# Patient Record
Sex: Male | Born: 1955 | Race: White | Marital: Single | Smoking: Former smoker
Health system: Southern US, Community
[De-identification: ages and names within clinical notes are randomized; demographics above are authoritative.]

## PROBLEM LIST (undated history)

## (undated) DIAGNOSIS — Z992 Dependence on renal dialysis: Secondary | ICD-10-CM

## (undated) DIAGNOSIS — M1990 Unspecified osteoarthritis, unspecified site: Secondary | ICD-10-CM

## (undated) DIAGNOSIS — C801 Malignant (primary) neoplasm, unspecified: Secondary | ICD-10-CM

## (undated) DIAGNOSIS — K209 Esophagitis, unspecified without bleeding: Secondary | ICD-10-CM

## (undated) DIAGNOSIS — I219 Acute myocardial infarction, unspecified: Secondary | ICD-10-CM

## (undated) DIAGNOSIS — G4730 Sleep apnea, unspecified: Secondary | ICD-10-CM

## (undated) DIAGNOSIS — E119 Type 2 diabetes mellitus without complications: Secondary | ICD-10-CM

## (undated) DIAGNOSIS — I5022 Chronic systolic (congestive) heart failure: Secondary | ICD-10-CM

## (undated) DIAGNOSIS — I10 Essential (primary) hypertension: Secondary | ICD-10-CM

## (undated) DIAGNOSIS — N186 End stage renal disease: Secondary | ICD-10-CM

## (undated) DIAGNOSIS — D649 Anemia, unspecified: Secondary | ICD-10-CM

## (undated) HISTORY — DX: End stage renal disease: Z99.2

## (undated) HISTORY — PX: EYE SURGERY: SHX253

## (undated) HISTORY — PX: EXCISION MORTON'S NEUROMA: SHX5013

## (undated) HISTORY — PX: CATARACT EXTRACTION: SUR2

## (undated) HISTORY — PX: CARDIAC SURGERY: SHX584

---

## 2009-09-20 ENCOUNTER — Ambulatory Visit: Payer: Self-pay | Admitting: Family Medicine

## 2009-09-20 ENCOUNTER — Inpatient Hospital Stay (HOSPITAL_COMMUNITY): Admission: EM | Admit: 2009-09-20 | Discharge: 2009-09-21 | Payer: Self-pay | Admitting: Emergency Medicine

## 2009-09-25 ENCOUNTER — Ambulatory Visit (HOSPITAL_COMMUNITY): Admission: RE | Admit: 2009-09-25 | Discharge: 2009-09-25 | Payer: Self-pay | Admitting: Gastroenterology

## 2009-11-06 ENCOUNTER — Encounter: Payer: Self-pay | Admitting: *Deleted

## 2010-07-13 NOTE — Miscellaneous (Signed)
Summary: Do Not Reschedule  Missed NP appt.  Per [facility] policy is not allowed to rschedule/  Elray Mcgregor RN  Nov 06, 2009 4:57 PM

## 2010-08-31 LAB — DIFFERENTIAL
Basophils Absolute: 0.1 10*3/uL (ref 0.0–0.1)
Basophils Relative: 1 % (ref 0–1)
Eosinophils Absolute: 0.1 10*3/uL (ref 0.0–0.7)
Eosinophils Relative: 2 % (ref 0–5)
Lymphocytes Relative: 23 % (ref 12–46)
Lymphs Abs: 1.5 10*3/uL (ref 0.7–4.0)
Monocytes Absolute: 0.4 10*3/uL (ref 0.1–1.0)
Monocytes Relative: 7 % (ref 3–12)
Neutro Abs: 4.3 10*3/uL (ref 1.7–7.7)
Neutrophils Relative %: 67 % (ref 43–77)

## 2010-08-31 LAB — CBC
HCT: 24.3 % — ABNORMAL LOW (ref 39.0–52.0)
Hemoglobin: 8.5 g/dL — ABNORMAL LOW (ref 13.0–17.0)
MCHC: 35 g/dL (ref 30.0–36.0)
MCV: 86 fL (ref 78.0–100.0)
Platelets: 346 10*3/uL (ref 150–400)
RBC: 2.82 MIL/uL — ABNORMAL LOW (ref 4.22–5.81)
RDW: 16.6 % — ABNORMAL HIGH (ref 11.5–15.5)
WBC: 6.3 10*3/uL (ref 4.0–10.5)

## 2010-09-01 LAB — DIFFERENTIAL
Basophils Absolute: 0.1 10*3/uL (ref 0.0–0.1)
Basophils Relative: 1 % (ref 0–1)
Eosinophils Absolute: 0.1 10*3/uL (ref 0.0–0.7)
Eosinophils Relative: 2 % (ref 0–5)
Lymphocytes Relative: 28 % (ref 12–46)
Lymphs Abs: 1.9 10*3/uL (ref 0.7–4.0)
Monocytes Absolute: 0.4 10*3/uL (ref 0.1–1.0)
Monocytes Relative: 7 % (ref 3–12)
Neutro Abs: 4.3 10*3/uL (ref 1.7–7.7)
Neutrophils Relative %: 63 % (ref 43–77)

## 2010-09-01 LAB — CBC
HCT: 21 % — ABNORMAL LOW (ref 39.0–52.0)
HCT: 21 % — ABNORMAL LOW (ref 39.0–52.0)
Hemoglobin: 7.3 g/dL — ABNORMAL LOW (ref 13.0–17.0)
Hemoglobin: 7.3 g/dL — ABNORMAL LOW (ref 13.0–17.0)
MCHC: 34.7 g/dL (ref 30.0–36.0)
MCHC: 34.8 g/dL (ref 30.0–36.0)
MCV: 85.3 fL (ref 78.0–100.0)
MCV: 85.5 fL (ref 78.0–100.0)
Platelets: 282 10*3/uL (ref 150–400)
Platelets: 296 10*3/uL (ref 150–400)
RBC: 2.46 MIL/uL — ABNORMAL LOW (ref 4.22–5.81)
RBC: 2.46 MIL/uL — ABNORMAL LOW (ref 4.22–5.81)
RDW: 14.9 % (ref 11.5–15.5)
RDW: 15.4 % (ref 11.5–15.5)
WBC: 6 10*3/uL (ref 4.0–10.5)
WBC: 6.8 10*3/uL (ref 4.0–10.5)

## 2010-09-01 LAB — POCT I-STAT, CHEM 8
BUN: 14 mg/dL (ref 6–23)
Calcium, Ion: 1.18 mmol/L (ref 1.12–1.32)
Chloride: 105 mEq/L (ref 96–112)
Creatinine, Ser: 0.7 mg/dL (ref 0.4–1.5)
Glucose, Bld: 284 mg/dL — ABNORMAL HIGH (ref 70–99)
HCT: 21 % — ABNORMAL LOW (ref 39.0–52.0)
Hemoglobin: 7.1 g/dL — ABNORMAL LOW (ref 13.0–17.0)
Potassium: 4.2 mEq/L (ref 3.5–5.1)
Sodium: 138 mEq/L (ref 135–145)
TCO2: 24 mmol/L (ref 0–100)

## 2010-09-01 LAB — PROTIME-INR
INR: 0.97 (ref 0.00–1.49)
Prothrombin Time: 12.8 seconds (ref 11.6–15.2)

## 2010-09-01 LAB — GLUCOSE, CAPILLARY
Glucose-Capillary: 173 mg/dL — ABNORMAL HIGH (ref 70–99)
Glucose-Capillary: 197 mg/dL — ABNORMAL HIGH (ref 70–99)
Glucose-Capillary: 199 mg/dL — ABNORMAL HIGH (ref 70–99)
Glucose-Capillary: 249 mg/dL — ABNORMAL HIGH (ref 70–99)

## 2010-09-01 LAB — POCT CARDIAC MARKERS
CKMB, poc: <1 ng/mL — ABNORMAL LOW (ref 1.0–8.0)
Myoglobin, poc: 43 ng/mL (ref 12–200)
Troponin i, poc: <0.05 ng/mL (ref 0.00–0.09)

## 2010-09-01 LAB — BASIC METABOLIC PANEL
BUN: 7 mg/dL (ref 6–23)
CO2: 25 mEq/L (ref 19–32)
Calcium: 8.4 mg/dL (ref 8.4–10.5)
Chloride: 112 mEq/L (ref 96–112)
Creatinine, Ser: 0.79 mg/dL (ref 0.4–1.5)
GFR calc Af Amer: >60 mL/min (ref >60)
GFR calc non Af Amer: >60 mL/min (ref >60)
Glucose, Bld: 246 mg/dL — ABNORMAL HIGH (ref 70–99)
Potassium: 4.1 mEq/L (ref 3.5–5.1)
Sodium: 140 mEq/L (ref 135–145)

## 2010-09-01 LAB — TYPE AND SCREEN
ABO/RH(D): B POS
Antibody Screen: NEGATIVE

## 2010-09-01 LAB — HEMOGLOBIN A1C
Hgb A1c MFr Bld: 9 % — ABNORMAL HIGH (ref 4.6–6.1)
Mean Plasma Glucose: 212 mg/dL

## 2010-09-01 LAB — HEMOCCULT GUIAC POC 1CARD (OFFICE): Fecal Occult Bld: NEGATIVE

## 2010-09-01 LAB — ABO/RH: ABO/RH(D): B POS

## 2011-09-02 ENCOUNTER — Ambulatory Visit (HOSPITAL_COMMUNITY)
Admission: RE | Admit: 2011-09-02 | Discharge: 2011-09-02 | Disposition: A | Discharge status: Home / Self Care | Payer: BC Managed Care – PPO | Source: Ambulatory Visit | Attending: General Surgery | Admitting: General Surgery

## 2011-09-02 ENCOUNTER — Other Ambulatory Visit (HOSPITAL_COMMUNITY): Payer: Self-pay | Admitting: General Surgery

## 2011-09-02 DIAGNOSIS — J189 Pneumonia, unspecified organism: Secondary | ICD-10-CM

## 2015-07-29 ENCOUNTER — Encounter (HOSPITAL_COMMUNITY): Payer: Self-pay | Admitting: Emergency Medicine

## 2015-07-29 ENCOUNTER — Emergency Department (HOSPITAL_COMMUNITY): Payer: Self-pay

## 2015-07-29 ENCOUNTER — Emergency Department (HOSPITAL_COMMUNITY)
Admission: EM | Admit: 2015-07-29 | Discharge: 2015-07-29 | Disposition: A | Discharge status: Home / Self Care | Payer: Self-pay | Attending: Emergency Medicine | Admitting: Emergency Medicine

## 2015-07-29 DIAGNOSIS — S62635A Displaced fracture of distal phalanx of left ring finger, initial encounter for closed fracture: Secondary | ICD-10-CM | POA: Insufficient documentation

## 2015-07-29 DIAGNOSIS — Z87891 Personal history of nicotine dependence: Secondary | ICD-10-CM | POA: Insufficient documentation

## 2015-07-29 DIAGNOSIS — Y9389 Activity, other specified: Secondary | ICD-10-CM | POA: Insufficient documentation

## 2015-07-29 DIAGNOSIS — S60142A Contusion of left ring finger with damage to nail, initial encounter: Secondary | ICD-10-CM | POA: Insufficient documentation

## 2015-07-29 DIAGNOSIS — Y998 Other external cause status: Secondary | ICD-10-CM | POA: Insufficient documentation

## 2015-07-29 DIAGNOSIS — S62609A Fracture of unspecified phalanx of unspecified finger, initial encounter for closed fracture: Secondary | ICD-10-CM

## 2015-07-29 DIAGNOSIS — E119 Type 2 diabetes mellitus without complications: Secondary | ICD-10-CM | POA: Insufficient documentation

## 2015-07-29 DIAGNOSIS — W231XXA Caught, crushed, jammed, or pinched between stationary objects, initial encounter: Secondary | ICD-10-CM | POA: Insufficient documentation

## 2015-07-29 DIAGNOSIS — Y9289 Other specified places as the place of occurrence of the external cause: Secondary | ICD-10-CM | POA: Insufficient documentation

## 2015-07-29 DIAGNOSIS — I10 Essential (primary) hypertension: Secondary | ICD-10-CM | POA: Insufficient documentation

## 2015-07-29 HISTORY — DX: Type 2 diabetes mellitus without complications: E11.9

## 2015-07-29 HISTORY — DX: Essential (primary) hypertension: I10

## 2015-07-29 MED ORDER — HYDROCODONE-ACETAMINOPHEN 5-325 MG PO TABS: 1.0000 | ORAL_TABLET | ORAL | Status: DC | PRN

## 2015-07-29 MED ORDER — DOXYCYCLINE HYCLATE 100 MG PO TABS
100.0000 mg | ORAL_TABLET | Freq: Once | ORAL | Status: AC
  Administered 2015-07-29: 100 mg via ORAL
  Filled 2015-07-29: qty 1

## 2015-07-29 MED ORDER — DOXYCYCLINE HYCLATE 100 MG PO CAPS: 100.0000 mg | ORAL_CAPSULE | Freq: Two times a day (BID) | ORAL | Status: DC

## 2015-07-29 NOTE — ED Notes (Signed)
Pt reports jamming his LT ring finger on Saturday. No deformity noted, but there is moderate edema and bruising to finger. Pt able to move extremity. Cap refill is brisk.

## 2015-07-29 NOTE — Discharge Instructions (Signed)
You have a comminuted fracture of the tip of the left ring finger. Please see Dr Aline Brochure for office appointment concerning this fracture. Please use doxycycline to prevent crush injury infection. Use tylenol for mild pain. Use norco for more severe pain. Keep finger elevated as much as possible. Finger Fracture Finger fractures are breaks in the bones of the fingers. There are many types of fractures. There are also different ways of treating these fractures. Your doctor will talk with you about the best way to treat your fracture. Injury is the main cause of broken fingers. This includes:  Injuries while playing sports.  Workplace injuries.  Falls. HOME CARE  Follow your doctor's instructions for:  Activities.  Exercises.  Physical therapy.  Take medicines only as told by your doctor for pain, discomfort, or fever. GET HELP IF: You have pain or swelling that limits:  The motion of your fingers.  The use of your fingers. GET HELP RIGHT AWAY IF:  You cannot feel your fingers, or your fingers become numb.   This information is not intended to replace advice given to you by your health care provider. Make sure you discuss any questions you have with your health care provider.   Document Released: 11/16/2007 Document Revised: 06/20/2014 Document Reviewed: 01/09/2013 Elsevier Interactive Patient Education [employer].

## 2015-07-29 NOTE — ED Provider Notes (Signed)
CSN: [id number]     Arrival date & time 07/29/15  14 History   First MD Initiated Contact with Patient 07/29/15 1612     Chief Complaint  Patient presents with  . Finger Injury     (Consider location/radiation/quality/duration/timing/severity/associated sxs/prior Treatment) Patient is a 60 y.o. male presenting with hand pain. The history is provided by the patient.  Hand Pain This is a new problem. The current episode started in the past 7 days. The problem occurs daily. The problem has been gradually worsening. Pertinent negatives include no abdominal pain, arthralgias, chest pain, coughing or neck pain. Exacerbated by: palpation and movement. He has tried ice for the symptoms. The treatment provided mild relief.    Past Medical History  Diagnosis Date  . Diabetes mellitus without complication ([place])   . Hypertension    History reviewed. No pertinent past surgical history. No family history on file. Social History  Substance Use Topics  . Smoking status: Former Research scientist (life sciences)  . Smokeless tobacco: None  . Alcohol Use: Yes     Comment: socially    Review of Systems  Constitutional: Negative for activity change.       All ROS Neg except as noted in HPI  HENT: Negative for nosebleeds.   Eyes: Negative for photophobia and discharge.  Respiratory: Negative for cough, shortness of breath and wheezing.   Cardiovascular: Negative for chest pain and palpitations.  Gastrointestinal: Negative for abdominal pain and blood in stool.  Genitourinary: Negative for dysuria, frequency and hematuria.  Musculoskeletal: Negative for back pain, arthralgias and neck pain.  Skin: Negative.   Neurological: Negative for dizziness, seizures and speech difficulty.  Psychiatric/Behavioral: Negative for hallucinations and confusion.      Allergies  Bee venom  Home Medications   Prior to Admission medications   Not on File   BP 171/94 mmHg  Pulse 95  Temp(Src) 98.3 F (36.8 C) (Oral)  Resp 16   Ht 5\' 11"  (1.803 m)  Wt 90.719 kg  BMI 27.91 kg/m2  SpO2 100% Physical Exam  Constitutional: He is oriented to person, place, and time. He appears well-developed and well-nourished.  Non-toxic appearance.  HENT:  Head: Normocephalic.  Right Ear: Tympanic membrane and external ear normal.  Left Ear: Tympanic membrane and external ear normal.  Eyes: EOM and lids are normal. Pupils are equal, round, and reactive to light.  Neck: Normal range of motion. Neck supple. Carotid bruit is not present.  Cardiovascular: Normal rate, regular rhythm, normal heart sounds, intact distal pulses and normal pulses.   Pulmonary/Chest: Breath sounds normal. No respiratory distress.  Abdominal: Soft. Bowel sounds are normal. There is no tenderness. There is no guarding.  Musculoskeletal: Normal range of motion.  There is swelling and bruising of the distal fourth finger, palmar surface greater than dorsum. There is a resolving subungual hematoma. Pt has applied 2 draining area through the nail. Some increase redness of the PIP area of the ring finger. FROM of the fingers. Cap refill is less than 2 sec. Radial pulse is 2+.  Lymphadenopathy:       Head (right side): No submandibular adenopathy present.       Head (left side): No submandibular adenopathy present.    He has no cervical adenopathy.  Neurological: He is alert and oriented to person, place, and time. He has normal strength. No cranial nerve deficit or sensory deficit.  Skin: Skin is warm and dry.  Psychiatric: He has a normal mood and affect. His speech is normal.  Nursing note and vitals reviewed.   ED Course  Procedures (including critical care time)  FRACTURE CARE Pt sustained a injury of the left ring finger on Feb 11. Xray reveal a fracture. Fracture of the distal Tuft explained to the patient. Procedure explained to  The patient. Pt ID by arm band. Pt fitted with a finger splint. Pt tolerated the procedure without problem.   Labs  Review Labs Reviewed - No data to display  Imaging Review Dg Finger Ring Left  07/29/2015  CLINICAL DATA:  This mass fourth digit with wrench distally with pain, initial encounter EXAM: LEFT RING FINGER 2+V COMPARISON:  None. FINDINGS: There is a comminuted phalangeal tuft fracture in the fourth distal phalanx. Mild displacement of the fracture fragments is noted. IMPRESSION: Fourth distal phalangeal tuft fracture Electronically Signed   By: Inez Catalina M.D.   On: 07/29/2015 15:04   I have personally reviewed and evaluated these images and lab results as part of my medical decision-making.   EKG Interpretation None      MDM Vital signs wnl. Xray of the left ring finger reveals a comminuted tuft fracture. Pt fitted with splint. He is concerned with possible post traumatic infection. Rx for doxycycline given to the patient. Rx for norco given for pain. Pt to follow up with orthopedics.   Final diagnoses:  Finger fracture, closed, initial encounter    **I have reviewed nursing notes, vital signs, and all appropriate lab and imaging results for this patient.Lily Kocher, PA-C 07/29/15 [place], PA-C 07/29/15 [place], MD 07/29/15 2337

## 2018-04-26 ENCOUNTER — Encounter (HOSPITAL_COMMUNITY): Payer: Self-pay

## 2018-04-26 ENCOUNTER — Other Ambulatory Visit: Payer: Self-pay

## 2018-04-26 ENCOUNTER — Emergency Department (HOSPITAL_COMMUNITY): Payer: Medicaid Other

## 2018-04-26 ENCOUNTER — Inpatient Hospital Stay (HOSPITAL_COMMUNITY)
Admission: EM | Admit: 2018-04-26 | Discharge: 2018-05-02 | DRG: 291 | Disposition: A | Discharge status: Home / Self Care | Payer: Medicaid Other | Attending: Internal Medicine | Admitting: Internal Medicine

## 2018-04-26 DIAGNOSIS — N179 Acute kidney failure, unspecified: Secondary | ICD-10-CM | POA: Diagnosis not present

## 2018-04-26 DIAGNOSIS — E1121 Type 2 diabetes mellitus with diabetic nephropathy: Secondary | ICD-10-CM

## 2018-04-26 DIAGNOSIS — E782 Mixed hyperlipidemia: Secondary | ICD-10-CM | POA: Diagnosis not present

## 2018-04-26 DIAGNOSIS — E1129 Type 2 diabetes mellitus with other diabetic kidney complication: Secondary | ICD-10-CM

## 2018-04-26 DIAGNOSIS — Z6827 Body mass index (BMI) 27.0-27.9, adult: Secondary | ICD-10-CM | POA: Diagnosis not present

## 2018-04-26 DIAGNOSIS — N183 Chronic kidney disease, stage 3 (moderate): Secondary | ICD-10-CM | POA: Diagnosis present

## 2018-04-26 DIAGNOSIS — I509 Heart failure, unspecified: Secondary | ICD-10-CM | POA: Diagnosis not present

## 2018-04-26 DIAGNOSIS — Z7984 Long term (current) use of oral hypoglycemic drugs: Secondary | ICD-10-CM | POA: Diagnosis not present

## 2018-04-26 DIAGNOSIS — E119 Type 2 diabetes mellitus without complications: Secondary | ICD-10-CM | POA: Diagnosis not present

## 2018-04-26 DIAGNOSIS — E876 Hypokalemia: Secondary | ICD-10-CM | POA: Diagnosis present

## 2018-04-26 DIAGNOSIS — T383X6A Underdosing of insulin and oral hypoglycemic [antidiabetic] drugs, initial encounter: Secondary | ICD-10-CM | POA: Diagnosis present

## 2018-04-26 DIAGNOSIS — I5041 Acute combined systolic (congestive) and diastolic (congestive) heart failure: Secondary | ICD-10-CM | POA: Diagnosis not present

## 2018-04-26 DIAGNOSIS — E1169 Type 2 diabetes mellitus with other specified complication: Secondary | ICD-10-CM

## 2018-04-26 DIAGNOSIS — Z8249 Family history of ischemic heart disease and other diseases of the circulatory system: Secondary | ICD-10-CM | POA: Diagnosis not present

## 2018-04-26 DIAGNOSIS — R9431 Abnormal electrocardiogram [ECG] [EKG]: Secondary | ICD-10-CM

## 2018-04-26 DIAGNOSIS — I10 Essential (primary) hypertension: Secondary | ICD-10-CM | POA: Diagnosis present

## 2018-04-26 DIAGNOSIS — E1122 Type 2 diabetes mellitus with diabetic chronic kidney disease: Secondary | ICD-10-CM | POA: Diagnosis present

## 2018-04-26 DIAGNOSIS — R079 Chest pain, unspecified: Secondary | ICD-10-CM

## 2018-04-26 DIAGNOSIS — R7989 Other specified abnormal findings of blood chemistry: Secondary | ICD-10-CM | POA: Diagnosis not present

## 2018-04-26 DIAGNOSIS — E669 Obesity, unspecified: Secondary | ICD-10-CM | POA: Diagnosis present

## 2018-04-26 DIAGNOSIS — I5042 Chronic combined systolic (congestive) and diastolic (congestive) heart failure: Secondary | ICD-10-CM

## 2018-04-26 DIAGNOSIS — I161 Hypertensive emergency: Secondary | ICD-10-CM | POA: Diagnosis present

## 2018-04-26 DIAGNOSIS — Z87891 Personal history of nicotine dependence: Secondary | ICD-10-CM

## 2018-04-26 DIAGNOSIS — I340 Nonrheumatic mitral (valve) insufficiency: Secondary | ICD-10-CM | POA: Diagnosis not present

## 2018-04-26 DIAGNOSIS — E785 Hyperlipidemia, unspecified: Secondary | ICD-10-CM | POA: Diagnosis not present

## 2018-04-26 DIAGNOSIS — I160 Hypertensive urgency: Secondary | ICD-10-CM | POA: Diagnosis present

## 2018-04-26 DIAGNOSIS — I429 Cardiomyopathy, unspecified: Secondary | ICD-10-CM | POA: Diagnosis not present

## 2018-04-26 DIAGNOSIS — I248 Other forms of acute ischemic heart disease: Secondary | ICD-10-CM | POA: Diagnosis not present

## 2018-04-26 DIAGNOSIS — R778 Other specified abnormalities of plasma proteins: Secondary | ICD-10-CM | POA: Diagnosis present

## 2018-04-26 DIAGNOSIS — E1165 Type 2 diabetes mellitus with hyperglycemia: Secondary | ICD-10-CM | POA: Diagnosis not present

## 2018-04-26 DIAGNOSIS — Z91030 Bee allergy status: Secondary | ICD-10-CM | POA: Diagnosis not present

## 2018-04-26 DIAGNOSIS — I130 Hypertensive heart and chronic kidney disease with heart failure and stage 1 through stage 4 chronic kidney disease, or unspecified chronic kidney disease: Secondary | ICD-10-CM | POA: Diagnosis not present

## 2018-04-26 HISTORY — DX: Chronic combined systolic (congestive) and diastolic (congestive) heart failure: I50.42

## 2018-04-26 LAB — LIPID PANEL
Cholesterol: 176 mg/dL (ref 0–200)
HDL: 45 mg/dL (ref >40)
LDL Cholesterol: 111 mg/dL — ABNORMAL HIGH (ref 0–99)
Total CHOL/HDL Ratio: 3.9 RATIO
Triglycerides: 101 mg/dL (ref <150)
VLDL: 20 mg/dL (ref 0–40)

## 2018-04-26 LAB — I-STAT TROPONIN, ED: Troponin i, poc: 0.16 ng/mL (ref 0.00–0.08)

## 2018-04-26 LAB — CBC
HCT: 42.6 % (ref 39.0–52.0)
Hemoglobin: 13.5 g/dL (ref 13.0–17.0)
MCH: 26.1 pg (ref 26.0–34.0)
MCHC: 31.7 g/dL (ref 30.0–36.0)
MCV: 82.2 fL (ref 80.0–100.0)
Platelets: 310 10*3/uL (ref 150–400)
RBC: 5.18 MIL/uL (ref 4.22–5.81)
RDW: 14.4 % (ref 11.5–15.5)
WBC: 7.3 10*3/uL (ref 4.0–10.5)
nRBC: 0 % (ref 0.0–0.2)

## 2018-04-26 LAB — TROPONIN I
Troponin I: 0.18 ng/mL (ref <0.03)
Troponin I: 0.15 ng/mL (ref <0.03)
Troponin I: 0.16 ng/mL (ref <0.03)
Troponin I: 0.15 ng/mL (ref <0.03)

## 2018-04-26 LAB — BASIC METABOLIC PANEL
Anion gap: 8 (ref 5–15)
BUN: 17 mg/dL (ref 8–23)
CO2: 24 mmol/L (ref 22–32)
Calcium: 9.1 mg/dL (ref 8.9–10.3)
Chloride: 105 mmol/L (ref 98–111)
Creatinine, Ser: 1.34 mg/dL — ABNORMAL HIGH (ref 0.61–1.24)
GFR calc Af Amer: >60 mL/min (ref >60)
GFR calc non Af Amer: 56 mL/min — ABNORMAL LOW (ref >60)
Glucose, Bld: 300 mg/dL — ABNORMAL HIGH (ref 70–99)
Potassium: 3.2 mmol/L — ABNORMAL LOW (ref 3.5–5.1)
Sodium: 137 mmol/L (ref 135–145)

## 2018-04-26 LAB — ECHOCARDIOGRAM COMPLETE
Height: 71 in
Weight: 3200 oz

## 2018-04-26 LAB — GLUCOSE, CAPILLARY: Glucose-Capillary: 161 mg/dL — ABNORMAL HIGH (ref 70–99)

## 2018-04-26 LAB — HEMOGLOBIN A1C
Hgb A1c MFr Bld: 11 % — ABNORMAL HIGH (ref 4.8–5.6)
Mean Plasma Glucose: 269 mg/dL

## 2018-04-26 LAB — CBG MONITORING, ED: Glucose-Capillary: 327 mg/dL — ABNORMAL HIGH (ref 70–99)

## 2018-04-26 LAB — BRAIN NATRIURETIC PEPTIDE: B Natriuretic Peptide: 2062 pg/mL — ABNORMAL HIGH (ref 0.0–100.0)

## 2018-04-26 MED ORDER — ACETAMINOPHEN 325 MG PO TABS
650.0000 mg | ORAL_TABLET | ORAL | Status: DC | PRN
  Administered 2018-04-26 – 2018-05-02 (×6): 650 mg via ORAL
  Filled 2018-04-26 (×6): qty 2

## 2018-04-26 MED ORDER — ENOXAPARIN SODIUM 40 MG/0.4ML ~~LOC~~ SOLN
40.0000 mg | SUBCUTANEOUS | Status: DC
  Administered 2018-04-26 – 2018-05-01 (×6): 40 mg via SUBCUTANEOUS
  Filled 2018-04-26 (×6): qty 0.4

## 2018-04-26 MED ORDER — NITROGLYCERIN 0.4 MG SL SUBL
0.4000 mg | SUBLINGUAL_TABLET | SUBLINGUAL | Status: AC | PRN
  Administered 2018-04-26 (×3): 0.4 mg via SUBLINGUAL
  Filled 2018-04-26 (×2): qty 1

## 2018-04-26 MED ORDER — FUROSEMIDE 10 MG/ML IJ SOLN
40.0000 mg | Freq: Once | INTRAMUSCULAR | Status: AC
  Administered 2018-04-26: 40 mg via INTRAVENOUS
  Filled 2018-04-26: qty 4

## 2018-04-26 MED ORDER — SODIUM CHLORIDE 0.9 % IV SOLN: 250.0000 mL | INTRAVENOUS | Status: DC | PRN

## 2018-04-26 MED ORDER — FUROSEMIDE 10 MG/ML IJ SOLN: 40.0000 mg | Freq: Once | INTRAMUSCULAR | Status: DC

## 2018-04-26 MED ORDER — ONDANSETRON HCL 4 MG/2ML IJ SOLN
4.0000 mg | Freq: Four times a day (QID) | INTRAMUSCULAR | Status: DC | PRN
  Administered 2018-04-27 – 2018-04-30 (×2): 4 mg via INTRAVENOUS
  Filled 2018-04-26 (×2): qty 2

## 2018-04-26 MED ORDER — SODIUM CHLORIDE 0.9% FLUSH
3.0000 mL | Freq: Two times a day (BID) | INTRAVENOUS | Status: DC
  Administered 2018-04-27 – 2018-05-02 (×11): 3 mL via INTRAVENOUS

## 2018-04-26 MED ORDER — FUROSEMIDE 10 MG/ML IJ SOLN
40.0000 mg | Freq: Two times a day (BID) | INTRAMUSCULAR | Status: DC
  Administered 2018-04-26 – 2018-04-28 (×4): 40 mg via INTRAVENOUS
  Filled 2018-04-26 (×5): qty 4

## 2018-04-26 MED ORDER — NITROGLYCERIN IN D5W 200-5 MCG/ML-% IV SOLN
0.0000 ug/min | INTRAVENOUS | Status: DC
  Administered 2018-04-27: 75 ug/min via INTRAVENOUS
  Filled 2018-04-26: qty 250

## 2018-04-26 MED ORDER — NITROGLYCERIN IN D5W 200-5 MCG/ML-% IV SOLN
5.0000 ug/min | Freq: Once | INTRAVENOUS | Status: AC
  Administered 2018-04-26: 5 ug/min via INTRAVENOUS
  Filled 2018-04-26: qty 250

## 2018-04-26 MED ORDER — INSULIN ASPART 100 UNIT/ML ~~LOC~~ SOLN
0.0000 [IU] | Freq: Three times a day (TID) | SUBCUTANEOUS | Status: DC
  Administered 2018-04-26: 7 [IU] via SUBCUTANEOUS
  Administered 2018-04-27: 3 [IU] via SUBCUTANEOUS
  Administered 2018-04-27: 7 [IU] via SUBCUTANEOUS
  Filled 2018-04-26: qty 1

## 2018-04-26 MED ORDER — ASPIRIN 81 MG PO CHEW
324.0000 mg | CHEWABLE_TABLET | Freq: Once | ORAL | Status: AC
  Administered 2018-04-26: 324 mg via ORAL
  Filled 2018-04-26: qty 4

## 2018-04-26 MED ORDER — INSULIN ASPART 100 UNIT/ML ~~LOC~~ SOLN: 0.0000 [IU] | Freq: Every day | SUBCUTANEOUS | Status: DC

## 2018-04-26 MED ORDER — SODIUM CHLORIDE 0.9% FLUSH: 3.0000 mL | INTRAVENOUS | Status: DC | PRN

## 2018-04-26 MED ORDER — ATORVASTATIN CALCIUM 40 MG PO TABS
40.0000 mg | ORAL_TABLET | Freq: Every day | ORAL | Status: DC
  Administered 2018-04-26 – 2018-05-01 (×6): 40 mg via ORAL
  Filled 2018-04-26 (×7): qty 1

## 2018-04-26 NOTE — ED Notes (Signed)
Gave pt Lovenox shot. Educated patient on possible bruising secondary to shot. Pt asked if shot was "muscular". I told patient that Lovenox shot was SubQ. After giving shot, patient asked "Is that it? Do you have a bunch of wimpy patients or something?" I stated "no, just a lot of sick patients. I just like to educated everyone before I give medications". Patient resting at this time. Family at bedside.

## 2018-04-26 NOTE — ED Notes (Signed)
Patient denies chest pain

## 2018-04-26 NOTE — ED Notes (Signed)
Left case management a voicemail and callback number.

## 2018-04-26 NOTE — H&P (Addendum)
History and Physical   [name] [id number] DOB: [birth year] DOA: 04/26/2018  Referring MD/NP/PA: Dr. Thurnell Garbe, [place] PCP: [name], MD Outpatient Specialists: None  Patient coming from: Home  Chief Complaint: Shortness of breath  HPI: TAHJAE [name] is a 62 y.o. male with a history of diabetes and HTN who presented to the ED with dyspnea and associated chest discomfort. He's had a couple weeks of feet swelling progressing to the leg and most recently to the abdomen and began requiring elevation of his head to sleep. He describes orthopnea for 2 weeks which has been associated with nonproductive cough and last night he had PND, awoke with abrupt sensation of dyspnea associated with chest pain described as constant, mild-moderate heaviness that lasted a few hours and subsided without further intervention. He's been taking no medications lately as he transitions PCP, has appt scheduled on Monday.   ED Course: Markedly hypertensive with modest improvement on nitro gtt, not hypoxic but has crackles on exam and is tachypneic. CXR consistent with pulmonary edema, BNP markedly elevated >2k, troponin elevated at 0.16, stable trend thus far, and diffuse nonspecific ST-T changes on ECG. Cardiology consulted, diuresis started, and hospitalists called to admit.   Review of Systems: No fever, chills, weight loss, has been gaining weight and unable to fit into usual pants. No palpitations, abd pain, N/V/D, diaphoresis, and per HPI. All others reviewed and are negative.   Past Medical History:  Diagnosis Date  . Diabetes mellitus without complication ([place])   . Hypertension    Past Surgical History:  Procedure Laterality Date  . EXCISION MORTON'S NEUROMA     - Former smoker, employed and lives with significant other locally. Occasional EtOH no other drugs.   Allergies  Allergen Reactions  . Bee Venom Anaphylaxis    - Family history: +valvular heart disease with porcine valve in  father, otherwise reviewed and not pertinent.   Prior to Admission medications   Medication Sig Start Date End Date Taking? Authorizing Provider  hydrochlorothiazide (HYDRODIURIL) 25 MG tablet Take 25 mg by mouth daily.   Yes [provider]  metFORMIN (GLUCOPHAGE) 1000 MG tablet Take 1,000 mg by mouth 2 (two) times daily.    Yes [provider]  metoprolol tartrate (LOPRESSOR) 25 MG tablet Take 25 mg by mouth 2 (two) times daily.   Yes [provider]    Physical Exam: Vitals:   04/26/18 1140 04/26/18 1150 04/26/18 1155 04/26/18 1200  BP: (!) 175/110 (!) 192/94 (!) 172/112 (!) 170/112  Pulse: 86 84 85 85  Resp: 18 (!) 28 (!) 22 (!) 24  Temp:      TempSrc:      SpO2: 97% 97% 100% 98%  Weight:      Height:       Constitutional: 62 y.o. male in no distress, calm demeanor Eyes: Lids and conjunctivae normal, PERRL ENMT: Mucous membranes are moist. Posterior pharynx clear of any exudate or lesions. Fair dentition.  Neck: normal, supple, no masses, no thyromegaly, +JVD to jaw Respiratory: Tachypneic on room air with L > R basilar crackles. Cardiovascular: Regular rate and rhythm, no murmurs, rubs, or gallops. No carotid bruits. + JVD. Dependent pitting edema, 3+ at ankles and extends to lower abdominal wall. Abdomen: Normoactive bowel sounds. No tenderness, non-distended, and no masses palpated. No hepatosplenomegaly. GU: No indwelling catheter Musculoskeletal: No clubbing / cyanosis. No joint deformity upper and lower extremities. Good ROM, no contractures. Normal muscle tone.  Skin: Warm, dry. No rashes,  wounds, no ulcers. No significant lesions noted.  Neurologic: CN II-XII grossly intact. Speech normal. No focal deficits in motor strength or sensation in all extremities.  Psychiatric: Alert and oriented x3. Normal judgment and insight. Mood euthymic with broad affect.   Labs on Admission: I have personally reviewed following labs and imaging  studies  CBC: Recent Labs  Lab 04/26/18 0800  WBC 7.3  HGB 13.5  HCT 42.6  MCV 82.2  PLT 992   Basic Metabolic Panel: Recent Labs  Lab 04/26/18 0800  NA 137  K 3.2*  CL 105  CO2 24  GLUCOSE 300*  BUN 17  CREATININE 1.34*  CALCIUM 9.1   GFR: Estimated Creatinine Clearance: 66.7 mL/min (A) (by C-G formula based on SCr of 1.34 mg/dL (H)).  Cardiac Enzymes: Recent Labs  Lab 04/26/18 0802 04/26/18 1011  TROPONINI 0.18* 0.15*   Lipid Profile: Recent Labs    04/26/18 1011  CHOL 176  HDL 45  LDLCALC 111*  TRIG 101  CHOLHDL 3.9    Radiological Exams on Admission: Dg Chest 2 View  Result Date: 04/26/2018 CLINICAL DATA:  Shortness of breath and chest pain beginning yesterday EXAM: CHEST - 2 VIEW COMPARISON:  09/02/2011 FINDINGS: Enlarged cardiac silhouette that could be due to cardiomegaly and/or pericardial fluid. Pulmonary venous hypertension with early interstitial edema. No pleural fluid accumulation at this time. No acute or significant bone finding. IMPRESSION: Acute congestive heart failure. Enlarged cardiac silhouette that could be due to cardiomegaly and/or pericardial fluid. Pulmonary venous hypertension with early interstitial edema. Electronically Signed   By: Nelson Chimes M.D.   On: 04/26/2018 08:48   EKG: Independently reviewed. NSR with non-specific ST and T wave changes.  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 BNP grossly elevated at 2,062, typical exam and CXR findings for CHF, and blunted IVC on echo.  - Cardiology  consulted, we've started lasix 40mg  IV BID.  - Strict I/O, daily weight, monitor BMP closely with diuresis and borderline CrCl.   Hypertension with hypertensive urgency:  - Continue nitro gtt and titrate to goal reduction no more than 25% in next 24 hours.  - Not starting BB yet per cardiology in setting of CHF decompensation.  - Planning to start ACE/ARB pending renal function  Elevated troponin: Suspect demand ischemia due to HTN urgency and CHF exacerbation.  - Continue trending, not starting heparin per cardiology recommendations.  - ASA, statin  T2DM:  - Update HbA1c - Sensitive SSI AC/HS - Hold metformin for now  Hyperlipidemia:  - Starting statin especially in light of DM  Elevated creatinine: Not technically AKI or CKD by CG formula, though at high risk for CKD with HTN and DM.  - Monitor Cr with diuresis, would benefit from risk factor control  DVT prophylaxis: Lovenox  Code Status: Full  Family Communication: Significant other at bedside Disposition Plan: Admit, anticipate several days of diuresis prior to discharge home. Consults called: Cardiology, [name]  Admission status: Inpatient   The appropriate admission status for this patient is INPATIENT. Inpatient status is judged to be reasonable and necessary in order to provide the required intensity  of service to ensure the patient's safety. The patient's presenting symptoms, physical exam findings, and initial radiographic and laboratory data in the context of their chronic comorbidities is felt to place them at high risk for further clinical deterioration. Furthermore, it is not anticipated that the patient will be medically stable for discharge from the hospital within 2 midnights of admission. The following factors support the admission status of inpatient.    The patient's presenting symptoms include dyspnea, chest pain, leg swelling, orthopnea.  The worrisome physical exam findings include hypertensive  urgency, marked dependent edema, crackles, JVD.  The initial radiographic and laboratory data are worrisome because of Pulmonary edema, troponin elevation.  The chronic co-morbidities include diabetes, not treated.  Patient requires inpatient status due to high intensity of service, high risk for further deterioration and high frequency of surveillance required.  I certify that at the point of admission it is my clinical judgment that the patient will require inpatient hospital care spanning beyond 2 midnights from the point of admission.     Patrecia Pour, MD [facility] [email or web address] Password [facility] 04/26/2018, 12:59 PM

## 2018-04-26 NOTE — ED Notes (Signed)
ECHO in room at this time

## 2018-04-26 NOTE — ED Notes (Signed)
Patient transported to X-ray 

## 2018-04-26 NOTE — ED Notes (Addendum)
Patient continues to make rude comments about [facility]. Stated "This used to be a good hospital but now it's a [facility] of shit. Any hospital without an OB floor you know is a piece of shit. My dad was a physician here, and we lived across the street. I used to date the nurses" Patient stated also "This Nitro pill tastes like shit. I know what shit tastes like and this is it." Patient has made several belittling comments about staff including myself. I did not address any of the complaints, and continued to be professional throughout encounter. Patient stated that he was just giving [place] amusement. Patient asked me "Why the hell did you move to [place]?" when I told him I wasn't born in the area. Patient's family members laughing with patient during his remarks to Apolonio Schneiders and I. This seemed to encourage his behavior. Patient denies pain at this time. Resting comfortably. Will continue to monitor.

## 2018-04-26 NOTE — Consult Note (Addendum)
Cardiology Consultation:   Patient ID: [name] MRN: [id number]; DOB: [birth year]  Admit date: 04/26/2018 Date of Consult: 04/26/2018  Primary Care Provider: [name], MD Primary Cardiologist: New - [name] Primary Electrophysiologist:  None    Patient Profile:   [name] is a 62 y.o. male with a hx of hypertension and type 2 diabetes mellitus who is being seen today for the evaluation of CHF at the request of Dr. Thurnell Garbe.  History of Present Illness:   [name] is a 62 year old male with a history of type 2 diabetes and hypertension.  He had had an upper respiratory tract infection for about 2 weeks and was gradually getting over this when he developed a recurrent cough about 2 weeks ago.  He then noticed progressive swelling of both legs, ankles, and feet.  He said if he was lying down and lifted his legs up he became short of breath.  He has had some mild chest pressure but nothing that worried him.  He said his father was a Psychologist, sport and exercise in town and he is aware of all the signs and symptoms of a heart attack and he did not feel like he was having one.  He is also noticed that over the past 2 weeks he has been unable to sleep lying flat.  Last night at around midnight he shortness of breath became worse and felt like he should be evaluated in the ED.  He was seeing a Garment/textile technologist in town for primary care (Dr. Romona Curls) until he retired.  He had been taking metformin for his diabetes until he ran out of it about a month ago.  He has not been taking any antihypertensive therapy for the past 2 months.  He does not recall what his medications are for blood pressure.  He was planning on establishing primary care with Dr. [name] whom he has known for years.  He said he has had an irregular heartbeat since he was a teenager.  He has not noticed any significant palpitations and has not had any syncopal episodes.  Point-of-care troponins are 0.16 and  0.18.  BNP markedly elevated at 2062.  CBC is normal.  He is hypokalemic with a potassium of 3.2.  Creatinine is elevated at 1.34, BUN normal at 17. There are no recent labs to compare this to.  Chest x-ray demonstrates acute CHF with an enlarged cardiac silhouette that could be due to cardiomegaly and/or pericardial fluid.  Pulmonary venous hypertension with early interstitial edema was also seen.  I personally reviewed the ECG which demonstrates sinus rhythm with diffuse nonspecific ST segment and T wave abnormalities.  A nurse in the room is about to administer IV Lasix 40 mg.  Social history: He is a Forensic psychologist.  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  He is also a horticulturist. He is married.  He is originally from [place] but moved here at the age of 78.  His father was a Garment/textile technologist.  Family history: Father had porcine aortic valve replacement.    Past Medical History:  Diagnosis Date  . Diabetes mellitus without complication ([place])   . Hypertension     Past Surgical History:  Procedure Laterality Date  . EXCISION MORTON'S NEUROMA         Inpatient Medications: Scheduled Meds:  Continuous Infusions:  PRN Meds:   Allergies:    Allergies  Allergen Reactions  . Bee Venom  Social History:   Social History   Socioeconomic History  . Marital status: Single    Spouse name: Not on file  . Number of children: Not on file  . Years of education: Not on file  . Highest education level: Not on file  Occupational History  . Not on file  Social Needs  . Financial resource strain: Not on file  . Food insecurity:    Worry: Not on file    Inability: Not on file  . Transportation needs:    Medical: Not on file    Non-medical: Not on file  Tobacco Use  . Smoking status: Former Research scientist (life sciences)  . Smokeless tobacco: Never Used  Substance and Sexual Activity  . Alcohol use: Yes    Comment: socially  . Drug  use: No  . Sexual activity: Not on file  Lifestyle  . Physical activity:    Days per week: Not on file    Minutes per session: Not on file  . Stress: Not on file  Relationships  . Social connections:    Talks on phone: Not on file    Gets together: Not on file    Attends religious service: Not on file    Active member of club or organization: Not on file    Attends meetings of clubs or organizations: Not on file    Relationship status: Not on file  . Intimate partner violence:    Fear of current or ex partner: Not on file    Emotionally abused: Not on file    Physically abused: Not on file    Forced sexual activity: Not on file  Other Topics Concern  . Not on file  Social History Narrative  . Not on file     ROS:  Please see the history of present illness.   All other ROS reviewed and negative.     Physical Exam/Data:   Vitals:   04/26/18 0802 04/26/18 0810 04/26/18 0830 04/26/18 0846  BP:   (!) 182/127 (!) 168/110  Pulse:   85 85  Resp:   (!) 21   Temp: 97.6 F (36.4 C)     TempSrc: Oral     SpO2:  100% 97%   Weight:      Height:       No intake or output data in the 24 hours ending 04/26/18 0922 Filed Weights   04/26/18 0749  Weight: 90.7 kg   Body mass index is 27.89 kg/m.  General:  Well nourished, well developed, head of bed slightly inclined, in no acute distress HEENT: normal Lymph: no adenopathy Neck: JVP elevated to angle of jaw Endocrine:  No thryomegaly Cardiac:  normal S1, S2; RRR; positive S3 gallop, no murmur Lungs: Faint bibasilar tales, no wheezing or rhonchi Abd: soft, nontender, no hepatomegaly  Ext: 2+ bilateral pitting lower extremity edema to knees Musculoskeletal:  No deformities, BUE and BLE strength normal and equal Skin: warm and dry  Neuro:  CNs 2-12 intact, no focal abnormalities noted Psych:  Normal affect   EKG:  The EKG was personally reviewed and demonstrates: Reviewed above Telemetry:  Telemetry was personally reviewed  and demonstrates: Sinus rhythm  Relevant CV Studies: None  Laboratory Data:  Chemistry Recent Labs  Lab 04/26/18 0800  NA 137  K 3.2*  CL 105  CO2 24  GLUCOSE 300*  BUN 17  CREATININE 1.34*  CALCIUM 9.1  GFRNONAA 56*  GFRAA >60  ANIONGAP 8    No results for input(s):  PROT, ALBUMIN, AST, ALT, ALKPHOS, BILITOT in the last 168 hours. Hematology Recent Labs  Lab 04/26/18 0800  WBC 7.3  RBC 5.18  HGB 13.5  HCT 42.6  MCV 82.2  MCH 26.1  MCHC 31.7  RDW 14.4  PLT 310   Cardiac Enzymes Recent Labs  Lab 04/26/18 0802  TROPONINI 0.18*    Recent Labs  Lab 04/26/18 0804  TROPIPOC 0.16*    BNP Recent Labs  Lab 04/26/18 0802  BNP 2,062.0*    DDimer No results for input(s): DDIMER in the last 168 hours.  Radiology/Studies:  Dg Chest 2 View  Result Date: 04/26/2018 CLINICAL DATA:  Shortness of breath and chest pain beginning yesterday EXAM: CHEST - 2 VIEW COMPARISON:  09/02/2011 FINDINGS: Enlarged cardiac silhouette that could be due to cardiomegaly and/or pericardial fluid. Pulmonary venous hypertension with early interstitial edema. No pleural fluid accumulation at this time. No acute or significant bone finding. IMPRESSION: Acute congestive heart failure. Enlarged cardiac silhouette that could be due to cardiomegaly and/or pericardial fluid. Pulmonary venous hypertension with early interstitial edema. Electronically Signed   By: Nelson Chimes M.D.   On: 04/26/2018 08:48    Assessment and Plan:   1.  Acute CHF: Unclear if this is systolic or diastolic in etiology.  He has hypertensive urgency.  IV nitroglycerin infusion has been ordered by the ED physician. I will order a 2-D echocardiogram with Doppler to evaluate cardiac structure, function, and regional wall motion. He is about to receive 1 dose of IV Lasix 40 mg.  I will start IV Lasix 40 mg twice daily with the next dose to be administered this evening.  He will need close monitoring of renal function given his  elevated creatinine of 1.34 and low potassium of 3.2. If renal function improves I plan to initiate ACE inhibitor as or angiotensin receptor blockers pending results of echocardiogram to assess LVEF.  As he is in acute heart failure with unknown LVEF, I will avoid beta-blockers at present.  He has a nonspecific troponin elevation.  I will order serial serum troponins.  Pending trend and results of echocardiogram, he may need an outpatient ischemic work-up.  2.  Hypertensive urgency: He has been out of antihypertensive therapy for the past 2 months.  He is not sure what medications he was taking.  ED physician has ordered IV nitroglycerin to acutely lower blood pressure. If renal function improves I plan to initiate ACE inhibitor as or angiotensin receptor blockers pending results of echocardiogram to assess LVEF.  As he is in acute heart failure with unknown LVEF, I will avoid beta-blockers at present.   3.  Type 2 diabetes mellitus: He was taking metformin at home until he ran out of it a month ago.  I will obtain an HbA1c.  4.  Troponin elevation: At present they are nonspecifically elevated.  I will obtain serial serum troponins.  ECG demonstrates nonspecific ST segment and T wave abnormalities.  Pending results of troponin trend and echocardiogram, he may require an outpatient ischemic work-up.  I do not feel he requires IV heparin at this time.  This may be due to hypertensive urgency.  I will obtain a lipid panel for risk stratification purposes.  I will also obtain an echocardiogram to evaluate cardiac structure and function. He received aspirin in the ED.  I will preemptively start low-dose aspirin 81 mg beginning 04/27/2018.   For questions or updates, please contact [facility] Please consult [email or web address] for contact info under  Signed, [name], MD  04/26/2018 9:22 AM   Addendum:  LDL elevated at 111.  Given his concomitant history of type 2 diabetes mellitus, statin  therapy is indicated.  I will start atorvastatin 40 mg daily.  Echocardiogram also personally reviewed which demonstrates low normal to mildly reduced left ventricular systolic function with global hypokinesis, LVEF 45 to 30%, diastolic dysfunction grade indeterminate, high ventricular filling pressures, mild mitral regurgitation, and severe left atrial dilatation, with IVC dilatation and blunting consistent with CVP of at least 15 mmHg.

## 2018-04-26 NOTE — ED Notes (Signed)
CRITICAL VALUE ALERT  Critical Value:  Troponin 0.15  Date & Time Notied:  04/26/2018, 1104  Provider Notified: Dr. Thurnell Garbe  Orders Received/Actions taken: see chart

## 2018-04-26 NOTE — ED Notes (Signed)
CRITICAL VALUE ALERT  Critical Value:  Trop 0.18  Date & Time Notied:  04/26/18, 7579  Provider Notified: Dr. Thurnell Garbe  Orders Received/Actions taken: no new orders at this time

## 2018-04-26 NOTE — ED Notes (Signed)
Date and time results received: 04/26/18 0817 (use smartphrase ".now" to insert current time)  Test: troponin  Critical Value: 0.16  Name of Provider Notified: Thurnell Garbe MD  Orders Received? Or Actions Taken?: n/a

## 2018-04-26 NOTE — ED Notes (Signed)
Pt has no chest pain at this time.  BP still elevated.  Pt states " I feel much better"

## 2018-04-26 NOTE — ED Notes (Signed)
ED Provider at bedside. 

## 2018-04-26 NOTE — Progress Notes (Signed)
*  PRELIMINARY RESULTS* Echocardiogram 2D Echocardiogram has been performed.  Leavy Cella 04/26/2018, 10:36 AM

## 2018-04-26 NOTE — ED Triage Notes (Signed)
Pt c/o sob and chest pain since midnight last night.  Reports symptoms woke him up.   Pt says pain is in left chest and nonradiating.

## 2018-04-26 NOTE — ED Notes (Signed)
Patient stated he is not currently having any chest pain.

## 2018-04-26 NOTE — ED Notes (Signed)
Cardiology paged at 0830 through [place], no return call. Repaged Cardiology @ [facility] Dr Bronson Ing.

## 2018-04-26 NOTE — ED Provider Notes (Signed)
[facility] EMERGENCY DEPARTMENT Provider Note   CSN: [id number] Arrival date & time: 04/26/18  8527     History   Chief Complaint Chief Complaint  Patient presents with  . Chest Pain    HPI [name] is a 62 y.o. male.  HPI  Pt was seen at 0755. Per pt, c/o gradual onset and resolution of 2 separate episodes of chest "pain" that began at midnight last night. Pt states while he was laying down approximately midnight he woke up with chest "aching" and SOB. Pt states his symptoms lasted approximately 3 hours before spontaneously resolving. Pt states he "took one of my blood pressure medicines" but cannot recall the name. Pt states he then developed another episode of chest discomfort an hour or 2 after the first episode, which has since resolved. Pt also c/o increasing pedal edema for the past 1 week. Denies palpitations, no back pain, no abd pain, no N/V/D, no fevers, no rash, no cough, no calf/LE pain or unilateral swelling.    Past Medical History:  Diagnosis Date  . Diabetes mellitus without complication ([place])   . Hypertension     There are no active problems to display for this patient.   Past Surgical History:  Procedure Laterality Date  . EXCISION MORTON'S NEUROMA          Home Medications    Prior to Admission medications   Medication Sig Start Date End Date Taking? Authorizing Provider  metFORMIN (GLUCOPHAGE) 1000 MG tablet Take 1,000 mg by mouth daily with breakfast.   Yes [provider]  doxycycline (VIBRAMYCIN) 100 MG capsule Take 1 capsule (100 mg total) by mouth 2 (two) times daily. 07/29/15   Lily Kocher, PA-C  HYDROcodone-acetaminophen (NORCO/VICODIN) 5-325 MG tablet Take 1 tablet by mouth every 4 (four) hours as needed. 07/29/15   Lily Kocher, PA-C    Family History No family history on file.  Social History Social History   Tobacco Use  . Smoking status: Former Research scientist (life sciences)  . Smokeless tobacco: Never Used  Substance Use Topics   . Alcohol use: Yes    Comment: socially  . Drug use: No     Allergies   Bee venom   Review of Systems Review of Systems ROS: Statement: All systems negative except as marked or noted in the HPI; Constitutional: Negative for fever and chills. ; ; Eyes: Negative for eye pain, redness and discharge. ; ; ENMT: Negative for ear pain, hoarseness, nasal congestion, sinus pressure and sore throat. ; ; Cardiovascular: Negative for palpitations, diaphoresis, +CP, dyspnea and peripheral edema. ; ; Respiratory: Negative for cough, wheezing and stridor. ; ; Gastrointestinal: Negative for nausea, vomiting, diarrhea, abdominal pain, blood in stool, hematemesis, jaundice and rectal bleeding. . ; ; Genitourinary: Negative for dysuria, flank pain and hematuria. ; ; Musculoskeletal: Negative for back pain and neck pain. Negative for swelling and trauma.; ; Skin: Negative for pruritus, rash, abrasions, blisters, bruising and skin lesion.; ; Neuro: Negative for headache, lightheadedness and neck stiffness. Negative for weakness, altered level of consciousness, altered mental status, extremity weakness, paresthesias, involuntary movement, seizure and syncope.       Physical Exam Updated Vital Signs BP (!) 197/137 (BP Location: Right Arm)   Pulse 91   Temp 97.6 F (36.4 C) (Oral)   Resp (!) 22   Ht 5\' 11"  (1.803 m)   Wt 90.7 kg   SpO2 100%   BMI 27.89 kg/m    Patient Vitals for the past 24 hrs:  BP Temp Temp src Pulse Resp SpO2 Height Weight  04/26/18 0846 (!) 168/110 - - 85 - - - -  04/26/18 0830 (!) 182/127 - - 85 (!) 21 97 % - -  04/26/18 0810 - - - - - 100 % - -  04/26/18 0802 - 97.6 F (36.4 C) Oral - - - - -  04/26/18 0800 (!) 193/132 - - 87 (!) 21 99 % - -  04/26/18 0755 (!) 197/137 - - 91 (!) 22 98 % - -  04/26/18 0752 (!) 197/137 - - 94 (!) 26 98 % - -  04/26/18 0751 (!) 195/137 - - 95 19 100 % - -  04/26/18 0749 - - - - - - 5\' 11"  (1.803 m) 90.7 kg    Physical Exam 0800: Physical  examination:  Nursing notes reviewed; Vital signs and O2 SAT reviewed;  Constitutional: Well developed, Well nourished, Well hydrated, In no acute distress; Head:  Normocephalic, atraumatic; Eyes: EOMI, PERRL, No scleral icterus; ENMT: Mouth and pharynx normal, Mucous membranes moist; Neck: Supple, Full range of motion, No lymphadenopathy; Cardiovascular: Regular rate and rhythm, No gallop; Respiratory: Breath sounds clear & equal bilaterally, No wheezes.  Speaking full sentences with ease, Normal respiratory effort/excursion; Chest: Nontender, Movement normal; Abdomen: Soft, Nontender, Nondistended, Normal bowel sounds; Genitourinary: No CVA tenderness; Extremities: Peripheral pulses normal, No tenderness, +1 pedal edema bilat, No calf tenderness or asymmetry.; Neuro: AA&Ox3, Major CN grossly intact.  Speech clear. No gross focal motor or sensory deficits in extremities.; Skin: Color normal, Warm, Dry.; Psych:  HPI given to me with eyes closed, poor eye contact.    ED Treatments / Results  Labs (all labs ordered are listed, but only abnormal results are displayed)   EKG EKG Interpretation  Date/Time:  Thursday April 26 2018 07:50:44 EST Ventricular Rate:  97 PR Interval:    QRS Duration: 109 QT Interval:  341 QTC Calculation: 434 R Axis:   86 Text Interpretation:  Sinus rhythm Borderline right axis deviation Borderline repolarization abnormality Baseline wander Artifact When compared with ECG of 09/20/2009 Nonspecific T wave abnormality Anterolateral leads is now Present Confirmed by Francine Graven [phone]) on 04/26/2018 8:04:44 AM   Radiology   Procedures Procedures (including critical care time)  Medications Ordered in ED Medications  nitroGLYCERIN (NITROSTAT) SL tablet 0.4 mg (has no administration in time range)  aspirin chewable tablet 324 mg (324 mg Oral Given 04/26/18 0807)     Initial Impression / Assessment and Plan / ED Course  I have reviewed the triage vital signs  and the nursing notes.  Pertinent labs & imaging results that were available during my care of the patient were reviewed by me and considered in my medical decision making (see chart for details).  MDM Reviewed: previous chart, nursing note and vitals Reviewed previous: labs and ECG Interpretation: labs, ECG and x-ray Total time providing critical care: 30-74 minutes. This excludes time spent performing separately reportable procedures and services. Consults: cardiology   CRITICAL CARE Performed by: Francine Graven Total critical care time: 55 minutes Critical care time was exclusive of separately billable procedures and treating other patients. Critical care was necessary to treat or prevent imminent or life-threatening deterioration. Critical care was time spent personally by me on the following activities: development of treatment plan with patient and/or surrogate as well as nursing, discussions with consultants, evaluation of patient's response to treatment, examination of patient, obtaining history from patient or surrogate, ordering and performing treatments and interventions, ordering and  review of laboratory studies, ordering and review of radiographic studies, pulse oximetry and re-evaluation of patient's condition.   Results for orders placed or performed during the hospital encounter of 27/03/50  Basic metabolic panel  Result Value Ref Range   Sodium 137 135 - 145 mmol/L   Potassium 3.2 (L) 3.5 - 5.1 mmol/L   Chloride 105 98 - 111 mmol/L   CO2 24 22 - 32 mmol/L   Glucose, Bld 300 (H) 70 - 99 mg/dL   BUN 17 8 - 23 mg/dL   Creatinine, Ser 1.34 (H) 0.61 - 1.24 mg/dL   Calcium 9.1 8.9 - 10.3 mg/dL   GFR calc non Af Amer 56 (L) >60 mL/min   GFR calc Af Amer >60 >60 mL/min   Anion gap 8 5 - 15  CBC  Result Value Ref Range   WBC 7.3 4.0 - 10.5 K/uL   RBC 5.18 4.22 - 5.81 MIL/uL   Hemoglobin 13.5 13.0 - 17.0 g/dL   HCT 42.6 39.0 - 52.0 %   MCV 82.2 80.0 - 100.0 fL   MCH  26.1 26.0 - 34.0 pg   MCHC 31.7 30.0 - 36.0 g/dL   RDW 14.4 11.5 - 15.5 %   Platelets 310 150 - 400 K/uL   nRBC 0.0 0.0 - 0.2 %  Brain natriuretic peptide  Result Value Ref Range   B Natriuretic Peptide 2,062.0 (H) 0.0 - 100.0 pg/mL  Troponin I - Once  Result Value Ref Range   Troponin I 0.18 (HH) <0.03 ng/mL  Troponin I - Now Then Q6H  Result Value Ref Range   Troponin I 0.15 (HH) <0.03 ng/mL  Lipid panel  Result Value Ref Range   Cholesterol 176 0 - 200 mg/dL   Triglycerides 101 <150 mg/dL   HDL 45 >40 mg/dL   Total CHOL/HDL Ratio 3.9 RATIO   VLDL 20 0 - 40 mg/dL   LDL Cholesterol 111 (H) 0 - 99 mg/dL  I-stat troponin, ED  Result Value Ref Range   Troponin i, poc 0.16 (HH) 0.00 - 0.08 ng/mL   Comment NOTIFIED PHYSICIAN    Comment 3          ECHOCARDIOGRAM COMPLETE  Result Value Ref Range   Weight 3,200 oz   Height 71 in   BP 181/119 mmHg   Dg Chest 2 View Result Date: 04/26/2018 CLINICAL DATA:  Shortness of breath and chest pain beginning yesterday EXAM: CHEST - 2 VIEW COMPARISON:  09/02/2011 FINDINGS: Enlarged cardiac silhouette that could be due to cardiomegaly and/or pericardial fluid. Pulmonary venous hypertension with early interstitial edema. No pleural fluid accumulation at this time. No acute or significant bone finding. IMPRESSION: Acute congestive heart failure. Enlarged cardiac silhouette that could be due to cardiomegaly and/or pericardial fluid. Pulmonary venous hypertension with early interstitial edema. Electronically Signed   By: Nelson Chimes M.D.   On: 04/26/2018 08:48     0900:  No call back from [facility] Cards MD. Lab troponin also elevated. New NS STTW changes anterior leads compared to previous EKG. New CHF on CXR with elevated BNP. ASA given. IV lasix and ntg gtt ordered. T/C returned from [facility] Cards [name], case discussed, including:  HPI, pertinent PM/SHx, VS/PE, dx testing, ED course and treatment:  Agreeable to come to ED for evaluation for  admission.  1140:  Cards MD has evaluated pt in the ED: agrees with IV ntg gtt and IV lasix, no heparin at this time and admit to [facility] to trend  troponin levels, Cards will continue to consult.  Pt continues to deny CP. T/C returned from [facility] Dr. Bonner Puna, case discussed, including:  HPI, pertinent PM/SHx, VS/PE, dx testing, ED course and treatment:  Agreeable to admit.     Final Clinical Impressions(s) / ED Diagnoses   Final diagnoses:  None    ED Discharge Orders    None       Francine Graven, DO 04/30/18 2152

## 2018-04-27 DIAGNOSIS — N183 Chronic kidney disease, stage 3 (moderate): Secondary | ICD-10-CM

## 2018-04-27 DIAGNOSIS — E1165 Type 2 diabetes mellitus with hyperglycemia: Secondary | ICD-10-CM

## 2018-04-27 DIAGNOSIS — E785 Hyperlipidemia, unspecified: Secondary | ICD-10-CM

## 2018-04-27 DIAGNOSIS — I5041 Acute combined systolic (congestive) and diastolic (congestive) heart failure: Secondary | ICD-10-CM

## 2018-04-27 DIAGNOSIS — E876 Hypokalemia: Secondary | ICD-10-CM

## 2018-04-27 DIAGNOSIS — I248 Other forms of acute ischemic heart disease: Secondary | ICD-10-CM

## 2018-04-27 DIAGNOSIS — I429 Cardiomyopathy, unspecified: Secondary | ICD-10-CM

## 2018-04-27 LAB — GLUCOSE, CAPILLARY
Glucose-Capillary: 237 mg/dL — ABNORMAL HIGH (ref 70–99)
Glucose-Capillary: 318 mg/dL — ABNORMAL HIGH (ref 70–99)
Glucose-Capillary: 267 mg/dL — ABNORMAL HIGH (ref 70–99)
Glucose-Capillary: 189 mg/dL — ABNORMAL HIGH (ref 70–99)

## 2018-04-27 LAB — MRSA PCR SCREENING: MRSA by PCR: NEGATIVE

## 2018-04-27 LAB — BASIC METABOLIC PANEL
Anion gap: 10 (ref 5–15)
BUN: 18 mg/dL (ref 8–23)
CO2: 24 mmol/L (ref 22–32)
Calcium: 8.2 mg/dL — ABNORMAL LOW (ref 8.9–10.3)
Chloride: 106 mmol/L (ref 98–111)
Creatinine, Ser: 1.5 mg/dL — ABNORMAL HIGH (ref 0.61–1.24)
GFR calc Af Amer: 56 mL/min — ABNORMAL LOW (ref >60)
GFR calc non Af Amer: 49 mL/min — ABNORMAL LOW (ref >60)
Glucose, Bld: 215 mg/dL — ABNORMAL HIGH (ref 70–99)
Potassium: 2.9 mmol/L — ABNORMAL LOW (ref 3.5–5.1)
Sodium: 140 mmol/L (ref 135–145)

## 2018-04-27 LAB — HEMOGLOBIN A1C
Hgb A1c MFr Bld: 10.9 % — ABNORMAL HIGH (ref 4.8–5.6)
Mean Plasma Glucose: 266.13 mg/dL

## 2018-04-27 LAB — MAGNESIUM: Magnesium: 1.7 mg/dL (ref 1.7–2.4)

## 2018-04-27 MED ORDER — LISINOPRIL 5 MG PO TABS
5.0000 mg | ORAL_TABLET | Freq: Every day | ORAL | Status: DC
  Administered 2018-04-27 – 2018-04-28 (×2): 5 mg via ORAL
  Filled 2018-04-27 (×2): qty 1

## 2018-04-27 MED ORDER — ASPIRIN 81 MG PO CHEW
81.0000 mg | CHEWABLE_TABLET | Freq: Every day | ORAL | Status: DC
  Administered 2018-04-27 – 2018-05-02 (×6): 81 mg via ORAL
  Filled 2018-04-27 (×6): qty 1

## 2018-04-27 MED ORDER — INSULIN ASPART 100 UNIT/ML ~~LOC~~ SOLN
0.0000 [IU] | Freq: Every day | SUBCUTANEOUS | Status: DC
  Administered 2018-04-29: 2 [IU] via SUBCUTANEOUS
  Administered 2018-04-30 – 2018-05-01 (×2): 3 [IU] via SUBCUTANEOUS

## 2018-04-27 MED ORDER — CARVEDILOL 3.125 MG PO TABS
3.1250 mg | ORAL_TABLET | Freq: Two times a day (BID) | ORAL | Status: DC
  Administered 2018-04-27 – 2018-04-28 (×4): 3.125 mg via ORAL
  Filled 2018-04-27 (×4): qty 1

## 2018-04-27 MED ORDER — POTASSIUM CHLORIDE CRYS ER 20 MEQ PO TBCR
40.0000 meq | EXTENDED_RELEASE_TABLET | Freq: Two times a day (BID) | ORAL | Status: AC
  Administered 2018-04-27: 40 meq via ORAL
  Filled 2018-04-27: qty 2

## 2018-04-27 MED ORDER — INSULIN GLARGINE 100 UNIT/ML ~~LOC~~ SOLN
10.0000 [IU] | Freq: Every day | SUBCUTANEOUS | Status: DC
  Administered 2018-04-27 – 2018-04-28 (×2): 10 [IU] via SUBCUTANEOUS
  Filled 2018-04-27 (×4): qty 0.1

## 2018-04-27 MED ORDER — OXYCODONE HCL 5 MG PO TABS: 5.0000 mg | ORAL_TABLET | Freq: Four times a day (QID) | ORAL | Status: DC | PRN

## 2018-04-27 MED ORDER — MAGNESIUM SULFATE 2 GM/50ML IV SOLN
2.0000 g | Freq: Once | INTRAVENOUS | Status: AC
  Administered 2018-04-27: 2 g via INTRAVENOUS
  Filled 2018-04-27: qty 50

## 2018-04-27 MED ORDER — HYDRALAZINE HCL 25 MG PO TABS
25.0000 mg | ORAL_TABLET | Freq: Three times a day (TID) | ORAL | Status: DC
  Administered 2018-04-27 – 2018-04-28 (×4): 25 mg via ORAL
  Filled 2018-04-27 (×4): qty 1

## 2018-04-27 MED ORDER — POTASSIUM CHLORIDE CRYS ER 20 MEQ PO TBCR
40.0000 meq | EXTENDED_RELEASE_TABLET | Freq: Two times a day (BID) | ORAL | Status: DC
  Administered 2018-04-27: 40 meq via ORAL
  Filled 2018-04-27: qty 2

## 2018-04-27 MED ORDER — HYDROCODONE-ACETAMINOPHEN 5-325 MG PO TABS
2.0000 | ORAL_TABLET | Freq: Once | ORAL | Status: AC
  Administered 2018-04-27: 2 via ORAL
  Filled 2018-04-27: qty 2

## 2018-04-27 MED ORDER — INSULIN ASPART 100 UNIT/ML ~~LOC~~ SOLN
0.0000 [IU] | Freq: Three times a day (TID) | SUBCUTANEOUS | Status: DC
  Administered 2018-04-27: 8 [IU] via SUBCUTANEOUS
  Administered 2018-04-28 (×2): 3 [IU] via SUBCUTANEOUS
  Administered 2018-04-28 – 2018-04-29 (×2): 8 [IU] via SUBCUTANEOUS
  Administered 2018-04-29: 5 [IU] via SUBCUTANEOUS
  Administered 2018-04-29: 3 [IU] via SUBCUTANEOUS
  Administered 2018-04-30: 5 [IU] via SUBCUTANEOUS
  Administered 2018-04-30 – 2018-05-01 (×3): 3 [IU] via SUBCUTANEOUS
  Administered 2018-05-01: 8 [IU] via SUBCUTANEOUS
  Administered 2018-05-01: 3 [IU] via SUBCUTANEOUS
  Administered 2018-05-02: 5 [IU] via SUBCUTANEOUS
  Administered 2018-05-02: 3 [IU] via SUBCUTANEOUS

## 2018-04-27 NOTE — Care Management Note (Signed)
Case Management Note  Patient Details  Name: BRYANT SAYE MRN: [id number] Date of Birth: Nov 18, 1955  Subjective/Objective:   CHF. DM. From home, independent. Works. No Insurance. No PCP. He does have an appt  next week to establish care. He has met with Development worker, community, not eligible for Medicaid.            Patient has spoken with diabetes coordinator. He knows he can purchase a [employer] brand glucose meter.        Action/Plan: DC home. Patient will benefit from having most cost effective medications prescribed. He is aware of [employer].   Expected Discharge Date:  04/29/18               Expected Discharge Plan:  Home/Self Care  In-House Referral:     Discharge planning Services  CM Consult  Post Acute Care Choice:  NA Choice offered to:  NA  DME Arranged:    DME Agency:     HH Arranged:    HH Agency:     Status of Service:  Completed, signed off  If discussed at [employer] of Stay Meetings, dates discussed:    Additional Comments:  Nola Botkins, Chauncey Reading, RN 04/27/2018, 12:11 PM

## 2018-04-27 NOTE — Plan of Care (Signed)
Nutrition Education Note  RD consulted for nutrition education regarding CHF and diabetes.   Lab Results  Component Value Date   HGBA1C 10.9 (H) 04/27/2018    Patient readily admits he has not been taking care of himself. He lives alone and lacked motivation / commitment for following carb consistent meal plan. He typically eats 2 meals daily - eggs and toast for breakfast and sandwich for lunch. He practices low carb or intermittent fasting which RD discouraged. Emphasized the importance or regular, moderate carb intake throughout the day and avoid skipping meals.  RD provided "Heart Healthy, Consistent Carbohydrate Nutrition Therapy" handout from the Academy of [employer] and Dietetics. Reviewed patient's dietary recall. Provided examples on ways to decrease sodium intake in diet. Discouraged intake of processed foods and use of salt shaker. Encouraged fresh fruits and vegetables as well as whole grain sources of carbohydrates to maximize fiber intake.   RD discussed why it is important for patient to adhere to diet recommendations, and emphasized the role of fluids, foods to avoid, and importance of weighing self daily.   Discussed different food groups and their effects on blood sugar, emphasizing carbohydrate-containing foods. Provided list of carbohydrates and recommended serving sizes of common foods.  Discussed importance of controlled and consistent carbohydrate intake throughout the day. Provided examples of ways to balance meals/snacks and encouraged intake of high-fiber, whole grain complex carbohydrates. Teach back method used.  Expect fair compliance. He says I am not going to eat unless I am hungry. Encouraged him to move more which may help stimulate improved intake. Recommended his goal be increase eating occasions of balanced intake cho, protein and fat source and add exercise with MD approval. Patient is obese and would benefit from gradual wt loss of 5-10% body weight.   Body  mass index is 30.99 kg/m. Pt meets criteria for obese based on current BMI.  Current diet order is Heart Healthy/CHO mod, patient is consuming approximately 75% of meals at this time. Labs and medications reviewed. No further nutrition interventions warranted at this time. RD contact information provided. If additional nutrition issues arise, please re-consult RD.   Colman Cater MS,RD,CSG,LDN Office: [phone] Pager: [phone]

## 2018-04-27 NOTE — Progress Notes (Addendum)
Progress Note  Patient Name: [name] Date of Encounter: 04/27/2018  Primary Cardiologist: [name], MD (New)  Subjective   He is feeling much better this morning with respect to shortness of breath and leg swelling.  He is very appreciative of his care.  His wife told me that she has emptied out 1300 cc since arriving to the floor yesterday evening.  He denies chest pain.  Inpatient Medications    Scheduled Meds: . atorvastatin  40 mg Oral q1800  . enoxaparin (LOVENOX) injection  40 mg Subcutaneous Q24H  . furosemide  40 mg Intravenous BID  . insulin aspart  0-5 Units Subcutaneous QHS  . insulin aspart  0-9 Units Subcutaneous TID WC  . potassium chloride  40 mEq Oral BID  . sodium chloride flush  3 mL Intravenous Q12H   Continuous Infusions: . sodium chloride    . magnesium sulfate 1 - 4 g bolus IVPB 2 g (04/27/18 0842)  . nitroGLYCERIN 75 mcg/min (04/27/18 0300)   PRN Meds: sodium chloride, acetaminophen, ondansetron (ZOFRAN) IV, oxyCODONE, sodium chloride flush   Vital Signs    Vitals:   04/26/18 2300 04/27/18 0000 04/27/18 0500 04/27/18 0700  BP: (!) 154/92 (!) 164/101  (!) 164/90  Pulse: 89 92  90  Resp: 18 (!) 23  (!) 25  Temp:  98.3 F (36.8 C)    TempSrc:  Oral    SpO2: 96% 95%  93%  Weight:   100.8 kg   Height:        Intake/Output Summary (Last 24 hours) at 04/27/2018 0911 Last data filed at 04/27/2018 0300 Gross per 24 hour  Intake 257.53 ml  Output 550 ml  Net -292.47 ml   Filed Weights   04/26/18 0749 04/26/18 2000 04/27/18 0500  Weight: 90.7 kg 100.8 kg 100.8 kg    Telemetry    Sinus rhythm and sinus tachycardia with rare PVCs- Personally Reviewed  ECG    No new tracings- Personally Reviewed  Physical Exam   GEN: No acute distress.   Neck:  JVP 10 Cardiac:  Heart rate at upper normal limits, normal S1/S2, S3 gallop appreciated, no murmurs.  Respiratory:  Faint crackles bilaterally. GI: Soft, nontender,  non-distended  MS:  1+ bilateral pitting lower extremity edema; No deformity. Neuro:  Nonfocal  Psych: Normal affect   Labs    Chemistry Recent Labs  Lab 04/26/18 0800 04/27/18 0502  NA 137 140  K 3.2* 2.9*  CL 105 106  CO2 24 24  GLUCOSE 300* 215*  BUN 17 18  CREATININE 1.34* 1.50*  CALCIUM 9.1 8.2*  GFRNONAA 56* 49*  GFRAA >60 56*  ANIONGAP 8 10     Hematology Recent Labs  Lab 04/26/18 0800  WBC 7.3  RBC 5.18  HGB 13.5  HCT 42.6  MCV 82.2  MCH 26.1  MCHC 31.7  RDW 14.4  PLT 310    Cardiac Enzymes Recent Labs  Lab 04/26/18 0802 04/26/18 1011 04/26/18 1352 04/26/18 2154  TROPONINI 0.18* 0.15* 0.16* 0.15*    Recent Labs  Lab 04/26/18 0804  TROPIPOC 0.16*     BNP Recent Labs  Lab 04/26/18 0802  BNP 2,062.0*     DDimer No results for input(s): DDIMER in the last 168 hours.   Radiology    Dg Chest 2 View  Result Date: 04/26/2018 CLINICAL DATA:  Shortness of breath and chest pain beginning yesterday EXAM: CHEST - 2 VIEW COMPARISON:  09/02/2011 FINDINGS: Enlarged cardiac silhouette that  could be due to cardiomegaly and/or pericardial fluid. Pulmonary venous hypertension with early interstitial edema. No pleural fluid accumulation at this time. No acute or significant bone finding. IMPRESSION: Acute congestive heart failure. Enlarged cardiac silhouette that could be due to cardiomegaly and/or pericardial fluid. Pulmonary venous hypertension with early interstitial edema. Electronically Signed   By: Nelson Chimes M.D.   On: 04/26/2018 08:48    Cardiac Studies   Echocardiogram 04/26/2018:  Study Conclusions  - Left ventricle: The cavity size was normal. There was minimal   concentric hypertrophy. Systolic function was mildly reduced. The   estimated ejection fraction was in the range of 45% to 50%.   Diffuse hypokinesis. Findings consistent with left ventricular   diastolic dysfunction, grade indeterminate. Doppler parameters   are consistent  with high ventricular filling pressure. - Mitral valve: Mildly calcified annulus. There was mild   regurgitation. - Left atrium: The atrium was severely dilated. - Right atrium: The atrium was mildly dilated. - Inferior vena cava: The vessel was dilated. The respirophasic   diameter changes were blunted (< 50%), consistent with elevated   central venous pressure. Estimated CVP 15 mmHg.  Patient Profile     62 y.o. male with a hx of hypertension and type 2 diabetes mellitus who is being seen today for the evaluation of CHF at the request of Dr. Thurnell Garbe.  Assessment & Plan    1.  Acute combined systolic and diastolic heart failure: LVEF 45 to 50% with grade 1 diastolic dysfunction and high ventricular filling pressures as detailed above.  Symptomatically improved I doubt the accuracy of both weight and I/O's recorded in the EMR.  The patient's wife emptied out 1300 cc.  Creatinine up to 1.5 from 1.34.  I will continue IV Lasix 40 mg twice daily today with a plan to potentially switch to oral diuretics tomorrow, perhaps 40 mg daily. I will start carvedilol 3.125 mg twice daily.  I will add low dose lisinopril 5 mg daily and closely monitor renal function. I will also add hydralazine 25 mg three times daily. I will attempt to wean nitroglycerin drip. While cardiomyopathy may be related to hypertensive heart disease, ischemic heart disease will need to be ruled out.  I will plan for an outpatient nuclear stress test.  2.  Hypertensive urgency: Blood pressure is elevated and remains on IV nitroglycerin infusion. I will start carvedilol 3.125 mg twice daily.  I am unable to add ACE inhibitors, angiotensin receptor blockers, or angiotensin receptor-neprilysin inhibitors due to advanced chronic kidney disease. I will also add hydralazine 25 mg three times daily. I will attempt to wean nitroglycerin drip.  3.  Type 2 diabetes mellitus: Overall blood sugars are poorly controlled with hemoglobin A1c of  11% on 04/26/2018.  He ran out of metformin about a month ago.  Currently being treated with insulin. He will need to establish with a PCP after hospital discharge for strict management. I also initiated atorvastatin 40 mg daily on 04/26/2018.  4.  Troponin elevation: Troponins are nonspecifically elevated.  LVEF is mildly reduced, 45 to 50%. While cardiomyopathy may be related to hypertensive heart disease, ischemic heart disease will need to be ruled out.  I will plan for an outpatient nuclear stress test. I will start aspirin 81 mg daily preemptively. I also initiated atorvastatin 40 mg daily on 04/26/2018.  5.  Hypokalemia: Potassium chloride supplementation has been ordered.  6. CKD stage III: Creatinine up to 1.5 from 1.34.  I will continue IV  Lasix 40 mg twice daily today with a plan to potentially switch to oral diuretics tomorrow, perhaps 40 mg daily.  This is likely secondary to both poorly controlled hypertension and diabetes.  I will add low-dose lisinopril 5 mg daily with close monitoring of renal function.  Time spent: 35 minutes.  For questions or updates, please contact [place] Please consult [email or web address] for contact info under Cardiology/STEMI.      Signed, [name], MD  04/27/2018, 9:11 AM

## 2018-04-27 NOTE — Progress Notes (Signed)
PROGRESS NOTE  [name]  HKV:[id number] DOB: [birth year] DOA: 04/26/2018 PCP: [name], MD  Brief Narrative: [name] is a 62 y.o. male with a history of diabetes and HTN who presented to the ED with dyspnea and associated chest discomfort. He's had a couple weeks of feet swelling progressing to the leg and most recently to the abdomen and began requiring elevation of his head to sleep. He describes orthopnea for 2 weeks which has been associated with nonproductive cough and last night he had PND, awoke with abrupt sensation of dyspnea associated with chest pain described as constant, mild-moderate heaviness that lasted a few hours and subsided without further intervention. He's been taking no medications lately as he transitions PCP, has appt scheduled on Monday.   ED Course: Markedly hypertensive with modest improvement on nitro gtt, not hypoxic but has crackles on exam and is tachypneic. CXR consistent with pulmonary edema, BNP markedly elevated >2k, troponin elevated at 0.16, stable trend, and diffuse nonspecific ST-T changes on ECG. Cardiology consulted, diuresis started, and hospitalists called to admit.   Hospital Course: Started on nitroglycerin infusion with improved BP control and will be weaned after starting oral medications 11/15. Symptomatically improving with diuresis though remains peripherally volume overloaded with hypertensive urgency.  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 BNP grossly elevated at 2,062, typical exam and CXR  findings for CHF, and blunted IVC on echo.  - Cardiology consulted, we've started lasix 40mg  IV BID, will continue and monitor BMP closely. I/O and weights not felt to be highly accurate.  - Starting BB, low dose lisinopril, and hydralazine, aspirin  Hypertension with hypertensive urgency: BP improved to goal range over course of yesterday, will wean nitroglycerin gtt today after starting oral medications. If off gtt, will transfer to telemetry.  - Will need ongoing close monitoring of BP and renal function/K with PCP. Appt 11/18.  Elevated troponin: Suspect demand ischemia due to HTN urgency and CHF exacerbation. Trend is flat - On ASA, statin, planning nuclear stress testing as outpatient.   T2DM uncontrolled with hyperglycemia: HbA1c is 11%, consistent with AVERAGE blood sugar of ~270mg /dl! Discussed at length with patient today, would suspect insulin will be necessary if lifestyle modifications aren't sufficient, but will start with oral meds at discharge. If creatinine tolerates, will start metformin, glipizide and have CBGs checked at home. Appreciate diabetes coordinator input.  - Dietitian consulted - Augment SSI and start basal insulin - Hold metformin for now  Hyperlipidemia:  - Starting statin especially in light of DM, LDL 111.  Elevated creatinine: Worsening with diuresis. At high risk for CKD with HTN and DM.  - Monitor Cr with diuresis, would benefit from risk factor control  DVT prophylaxis: Lovenox Code Status: Full Family Communication: SO at bedside Disposition Plan: Home once euvolemic.  Consultants:   Cardiology  Procedures:   None  Antimicrobials:  None   Subjective: Feels generally better, still swelling up to hips bilaterally. Significant urine output reported but not charted.  Objective: Vitals:   04/27/18 1000 04/27/18 1100 04/27/18 1200 04/27/18 1345  BP: (!) 144/80 (!) 153/87 104/70 (!) 140/97  Pulse: 95 93    Resp: (!) 23 (!) 23      Temp:      TempSrc:      SpO2: 91% 94%    Weight:      Height:        Intake/Output Summary (Last 24 hours) at 04/27/2018 1514 Last data filed at 04/27/2018 1258 Gross per 24 hour  Intake 496.65 ml  Output 2450 ml  Net -1953.35 ml   Filed Weights   04/26/18 0749 04/26/18 2000 04/27/18 0500  Weight: 90.7 kg 100.8 kg 100.8 kg    Gen: 62 y.o. male in no distress  Pulm: Non-labored breathing. Clear to auscultation bilaterally.  CV: Regular rate and rhythm. No murmur, rub, or gallop. + JVD, Pitting edema to thighs. GI: Abdomen soft, non-tender, non-distended, with normoactive bowel sounds. No organomegaly or masses felt. Ext: Warm, no deformities Skin: No rashes, lesions or ulcers Neuro: Alert and oriented. No focal neurological deficits. Psych: Judgement and insight appear normal. Mood & affect appropriate.   Data Reviewed: I have personally reviewed following labs and imaging studies  CBC: Recent Labs  Lab 04/26/18 0800  WBC 7.3  HGB 13.5  HCT 42.6  MCV 82.2  PLT 914   Basic Metabolic Panel: Recent Labs  Lab 04/26/18 0800 04/27/18 0502  NA 137 140  K 3.2* 2.9*  CL 105 106  CO2 24 24  GLUCOSE 300* 215*  BUN 17 18  CREATININE 1.34* 1.50*  CALCIUM 9.1 8.2*  MG  --  1.7   GFR: Estimated Creatinine Clearance: 62.5 mL/min (A) (by C-G formula based on SCr of 1.5 mg/dL (H)). Liver Function Tests: No results for input(s): AST, ALT, ALKPHOS, BILITOT, PROT, ALBUMIN in the last 168 hours. No results for input(s): LIPASE, AMYLASE in the last 168 hours. No results for input(s): AMMONIA in the last 168 hours. Coagulation Profile: No results for input(s): INR, PROTIME in the last 168 hours. Cardiac Enzymes: Recent Labs  Lab 04/26/18 0802 04/26/18 1011 04/26/18 1352 04/26/18 2154  TROPONINI 0.18* 0.15* 0.16* 0.15*   BNP (last 3 results) No results for input(s): PROBNP in the last 8760 hours. HbA1C: Recent Labs    04/26/18 1011 04/27/18 0502  HGBA1C 11.0*  10.9*   CBG: Recent Labs  Lab 04/26/18 1657 04/26/18 2132 04/27/18 0759 04/27/18 1141  GLUCAP 327* 161* 237* 318*   Lipid Profile: Recent Labs    04/26/18 1011  CHOL 176  HDL 45  LDLCALC 111*  TRIG 101  CHOLHDL 3.9   Thyroid Function Tests: No results for input(s): TSH, T4TOTAL, FREET4, T3FREE, THYROIDAB in the last 72 hours. Anemia Panel: No results for input(s): VITAMINB12, FOLATE, FERRITIN, TIBC, IRON, RETICCTPCT in the last 72 hours. Urine analysis: No results found for: COLORURINE, APPEARANCEUR, [place], [place], GLUCOSEU, [place], [place], [place], [place], [place], NITRITE, LEUKOCYTESUR Recent Results (from the past 240 hour(s))  MRSA PCR Screening     Status: None   Collection Time: 04/26/18  6:48 PM  Result Value Ref Range Status   MRSA by PCR NEGATIVE NEGATIVE Final    Comment:        The GeneXpert MRSA Assay (FDA approved for NASAL specimens only), is one component of a comprehensive MRSA colonization surveillance program. It is not intended to diagnose MRSA infection nor to guide or monitor treatment for MRSA infections. Performed at [facility], [address], [place], [place] [address]       Radiology Studies: Dg Chest 2 View  Result Date: 04/26/2018 CLINICAL  DATA:  Shortness of breath and chest pain beginning yesterday EXAM: CHEST - 2 VIEW COMPARISON:  09/02/2011 FINDINGS: Enlarged cardiac silhouette that could be due to cardiomegaly and/or pericardial fluid. Pulmonary venous hypertension with early interstitial edema. No pleural fluid accumulation at this time. No acute or significant bone finding. IMPRESSION: Acute congestive heart failure. Enlarged cardiac silhouette that could be due to cardiomegaly and/or pericardial fluid. Pulmonary venous hypertension with early interstitial edema. Electronically Signed   By: Nelson Chimes M.D.   On: 04/26/2018 08:48    Scheduled Meds: . aspirin  81 mg Oral Daily  . atorvastatin  40 mg Oral q1800   . carvedilol  3.125 mg Oral BID WC  . enoxaparin (LOVENOX) injection  40 mg Subcutaneous Q24H  . furosemide  40 mg Intravenous BID  . hydrALAZINE  25 mg Oral Q8H  . insulin aspart  0-5 Units Subcutaneous QHS  . insulin aspart  0-9 Units Subcutaneous TID WC  . lisinopril  5 mg Oral Daily  . potassium chloride  40 mEq Oral BID  . sodium chloride flush  3 mL Intravenous Q12H   Continuous Infusions: . sodium chloride    . nitroGLYCERIN 20 mcg/min (04/27/18 1300)     LOS: 1 day   Time spent: 35 minutes.  Patrecia Pour, MD [facility] [email or web address] Password [facility] 04/27/2018, 3:14 PM

## 2018-04-27 NOTE — Discharge Instructions (Signed)
Glucose Products:  ReliOn™ glucose products raise low blood sugar fast. Tablets are free of fat, caffeine, sodium and gluten. They are portable and easy to carry, making it easier for people with diabetes to BE PREPARED for lows. ° °Glucose Tablets °Available in 6 flavors ° 10 ct...................................... $1.00 ° 50 ct...................................... $3.98 °Glucose Shot..................................$1.48 °Glucose Gel....................................$3.44 ° °Alcohol Swabs °Alcohol swabs are used to sterilize your injection site. All of our swabs are individually wrapped for maximum safety, convenience and moisture retention. °ReliOn™ Alcohol Swabs ° 100 ct Swabs..............................$1.00 ° 400 ct Swabs..............................$3.74 ° °Lancets °ReliOn™ offers three lancet options conveniently designed to work with almost every lancing device. Each features a protective disk, which guarantees sterility before testing. °ReliOn™ Lancets ° 100 ct Lancets $1.56 ° 200 ct Lancets $2.64 °Available in Ultra-Thin, Thin & Micro-Thin °ReliOn™ 2-IN-1 Lancing Device ° 50 ct Lancets..................................... $3.44 °Available in 30 gauge and 25 gauge °ReliOn™ Lancing Device....................$5.84 ° °Blood Glucose Monitors °ReliOn™ offers a full range of blood glucose testing options to provide an accurate, affordable °system that meets each person's unique needs and preferences. °Prime Meter....................................... $9.00 °Prime Test Strips ° 25 test strips.................................... $5.00 ° 50 test strips.................................... $9.00 ° 100 test strips.................................$17.88 °Premier BLU Meter  ………………………………  $18.98 °Premier Voice Meter  ……………………………….  $14.98 °Premier Test Strips ° 50 test strips.................................... $9.00 ° 100 test strips.................................$17.88 °Premier Compact Meter Kit   ………………………………  $19.44 °Kit includes: ° 50 test strips ° 10 lancets ° Lancing device ° Carry case ° ° °

## 2018-04-27 NOTE — Progress Notes (Signed)
Inpatient Diabetes Program Recommendations  AACE/ADA: New Consensus Statement on Inpatient Glycemic Control (2015)  Target Ranges:  Prepandial:   less than 140 mg/dL      Peak postprandial:   less than 180 mg/dL (1-2 hours)      Critically ill patients:  140 - 180 mg/dL   Review of Glycemic Control  Diabetes history: DM 2  Inpatient Diabetes Program Recommendations:    Spoke with patient over the phone. Patient reports not having Metformin x 2 months. Patient does not have glucose meter at home. Discussed current A1c 11%. Patient can identify A1c goal below 7%. Discussed medications at time of d/c. Patient does not have insurance and has an appointment Monday to establish care at a new PCP.   Discussed lifestyle modifications briefly. Patient has several family members with diabetes and have passed with DM complications. He feels comfortable in his knowledge of DM, he just had difficult social situation.   Recommend restarting Metformin and starting a sulfonylurea at time of d/c due to cost.  Told patient to refer to [facility] brand glucose meters in order to get glucose meter and supplies. Added [employer] price list to d/c paperwork.  While inpatient consider Lantus 12 units.  Thanks,  Tama Headings RN, MSN, BC-ADM Inpatient Diabetes Coordinator Team Pager [phone] (8a-5p)

## 2018-04-28 LAB — BASIC METABOLIC PANEL
Anion gap: 7 (ref 5–15)
BUN: 19 mg/dL (ref 8–23)
CO2: 25 mmol/L (ref 22–32)
Calcium: 8.3 mg/dL — ABNORMAL LOW (ref 8.9–10.3)
Chloride: 106 mmol/L (ref 98–111)
Creatinine, Ser: 1.49 mg/dL — ABNORMAL HIGH (ref 0.61–1.24)
GFR calc Af Amer: 57 mL/min — ABNORMAL LOW (ref >60)
GFR calc non Af Amer: 49 mL/min — ABNORMAL LOW (ref >60)
Glucose, Bld: 198 mg/dL — ABNORMAL HIGH (ref 70–99)
Potassium: 3.6 mmol/L (ref 3.5–5.1)
Sodium: 138 mmol/L (ref 135–145)

## 2018-04-28 LAB — MAGNESIUM: Magnesium: 2 mg/dL (ref 1.7–2.4)

## 2018-04-28 LAB — GLUCOSE, CAPILLARY
Glucose-Capillary: 172 mg/dL — ABNORMAL HIGH (ref 70–99)
Glucose-Capillary: 173 mg/dL — ABNORMAL HIGH (ref 70–99)
Glucose-Capillary: 277 mg/dL — ABNORMAL HIGH (ref 70–99)
Glucose-Capillary: 164 mg/dL — ABNORMAL HIGH (ref 70–99)

## 2018-04-28 LAB — HIV ANTIBODY (ROUTINE TESTING W REFLEX): HIV Screen 4th Generation wRfx: NONREACTIVE

## 2018-04-28 MED ORDER — FUROSEMIDE 10 MG/ML IJ SOLN
60.0000 mg | Freq: Two times a day (BID) | INTRAMUSCULAR | Status: DC
  Administered 2018-04-28 – 2018-04-30 (×5): 60 mg via INTRAVENOUS
  Filled 2018-04-28 (×6): qty 6

## 2018-04-28 MED ORDER — GUAIFENESIN-DM 100-10 MG/5ML PO SYRP
5.0000 mL | ORAL_SOLUTION | ORAL | Status: DC | PRN
  Administered 2018-04-28 – 2018-05-02 (×16): 5 mL via ORAL
  Filled 2018-04-28 (×16): qty 5

## 2018-04-28 MED ORDER — POTASSIUM CHLORIDE CRYS ER 20 MEQ PO TBCR
40.0000 meq | EXTENDED_RELEASE_TABLET | Freq: Every day | ORAL | Status: AC
  Administered 2018-04-28 – 2018-04-29 (×2): 40 meq via ORAL
  Filled 2018-04-28 (×2): qty 2

## 2018-04-28 MED ORDER — INSULIN GLARGINE 100 UNIT/ML ~~LOC~~ SOLN
12.0000 [IU] | Freq: Every day | SUBCUTANEOUS | Status: DC
  Administered 2018-04-29 – 2018-05-02 (×4): 12 [IU] via SUBCUTANEOUS
  Filled 2018-04-28 (×9): qty 0.12

## 2018-04-28 MED ORDER — HYDRALAZINE HCL 25 MG PO TABS
50.0000 mg | ORAL_TABLET | Freq: Three times a day (TID) | ORAL | Status: DC
  Administered 2018-04-28 – 2018-05-01 (×7): 50 mg via ORAL
  Filled 2018-04-28 (×7): qty 2

## 2018-04-28 NOTE — Progress Notes (Addendum)
PROGRESS NOTE  KENTRAVIOUS LIPFORD  ZOX:[id number] DOB: 1955/11/03 DOA: 04/26/2018 PCP: [name], MD  Brief Narrative: LON KLIPPEL is a 62 y.o. male with a history of diabetes and HTN who presented to the ED with dyspnea and associated chest discomfort. He's had a couple weeks of feet swelling progressing to the leg and most recently to the abdomen and began requiring elevation of his head to sleep. He describes orthopnea for 2 weeks which has been associated with nonproductive cough and last night he had PND, awoke with abrupt sensation of dyspnea associated with chest pain described as constant, mild-moderate heaviness that lasted a few hours and subsided without further intervention. He's been taking no medications lately as he transitions PCP, has appt scheduled on Monday.   ED Course: Markedly hypertensive with modest improvement on nitro gtt, not hypoxic but has crackles on exam and is tachypneic. CXR consistent with pulmonary edema, BNP markedly elevated >2k, troponin elevated at 0.16, stable trend, and diffuse nonspecific ST-T changes on ECG. Cardiology consulted, diuresis started, and hospitalists called to admit.   Hospital Course: Started on nitroglycerin infusion with improved BP, weaned 11/15 with initiation of coreg, lisinopril, hydralazine. Remains grossly volume overloaded and has not had a brisk response to lasix 40mg  IV BID.   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 BNP grossly elevated at 2,062, typical exam and CXR findings for CHF, and blunted  IVC on echo.  - Cardiology consulted, we've started lasix 40mg  IV BID. UOP not very significant, creatinine stable. Will increase to lasix 60mg  IV BID, await further cardiology recommendations. Down 4 lbs, remains very symptomatic. - Started BB, low dose lisinopril, hydralazine. Remains above goal, will augment hydralazine, consider isosorbide. Once not aggressively diuresing, could increase lisinopril as well.   Hypertension with hypertensive urgency: Weaned off nitroglycerin gtt, started 3 drug regimen.  - Augment antihypertensives as above. - Will need ongoing close monitoring of BP and renal function/K with PCP. Appt 11/18.  Elevated troponin: Suspect demand ischemia due to HTN urgency and CHF exacerbation. Trend is flat - On ASA, statin, planning nuclear stress testing as outpatient.   T2DM uncontrolled with hyperglycemia: HbA1c is 11%, consistent with AVERAGE blood sugar of ~270mg /dl! Discussed at length with patient. I suspect insulin will be necessary if lifestyle modifications aren't sufficient, but will start with oral meds at discharge. If creatinine tolerates, will start metformin, glipizide and have CBGs checked at home. Appreciate diabetes coordinator input.  - Dietitian consulted - Augmented SSI and started basal insulin, will increase based on fasting AM CBG >150. - Hold metformin for now  Hypokalemia: Improved with replacement.  - Continue supplement K in light of diuresis.  Hyperlipidemia:  - Starting statin especially in light of DM, LDL 111.  Elevated creatinine: Worsening with diuresis. At high risk for CKD with HTN and DM.  - Monitor Cr with diuresis, would benefit from risk factor control  Obesity: Reported episodes of apnea.  - Strongly recommend sleep study as an outpatient.  DVT prophylaxis: Lovenox Code Status: Full Family Communication: SO at bedside Disposition Plan: Home once euvolemic. Can transfer out of SDU (  order placed yesterday)  Consultants:     Cardiology  Procedures:   None  Antimicrobials:  None   Subjective: Still with trouble breathing and swelling up entire legs bilaterally, doesn't feel like he's been urinating any more than normal. No chest pain or syncope.  Objective: Vitals:   04/28/18 0600 04/28/18 0700 04/28/18 0800 04/28/18 0900  BP: (!) 162/107 (!) 159/103 (!) 161/105 (!) 173/101  Pulse: 88 91 90 93  Resp:      Temp:      TempSrc:      SpO2: 96% 97% 95% 92%  Weight:      Height:        Intake/Output Summary (Last 24 hours) at 04/28/2018 1212 Last data filed at 04/27/2018 2143 Gross per 24 hour  Intake -  Output 1100 ml  Net -1100 ml   Filed Weights   04/26/18 2000 04/27/18 0500 04/28/18 0500  Weight: 100.8 kg 100.8 kg 98.9 kg   Gen: 62 y.o. male in no distress Pulm: Nonlabored breathing room air. Clear. CV: Regular rate and rhythm. No murmur, rub, or gallop. Mild JVD, 2+ pitting edema extending to thighs bilaterally. GI: Abdomen soft, non-tender, non-distended, with normoactive bowel sounds.  Ext: Warm, no deformities Skin: No rashes, lesions or ulcers on visualized skin.  Neuro: Alert and oriented. No focal neurological deficits. Psych: Judgement and insight appear fair. Mood euthymic & affect congruent. Behavior is appropriate.    Data Reviewed: I have personally reviewed following labs and imaging studies  CBC: Recent Labs  Lab 04/26/18 0800  WBC 7.3  HGB 13.5  HCT 42.6  MCV 82.2  PLT 409   Basic Metabolic Panel: Recent Labs  Lab 04/26/18 0800 04/27/18 0502 04/28/18 0440  NA 137 140 138  K 3.2* 2.9* 3.6  CL 105 106 106  CO2 24 24 25   GLUCOSE 300* 215* 198*  BUN 17 18 19   CREATININE 1.34* 1.50* 1.49*  CALCIUM 9.1 8.2* 8.3*  MG  --  1.7 2.0   GFR: Estimated Creatinine Clearance: 62.4 mL/min (A) (by C-G formula based on SCr of 1.49 mg/dL (H)). Liver Function Tests: No results for input(s): AST, ALT, ALKPHOS, BILITOT, PROT, ALBUMIN in the last 168 hours. No  results for input(s): LIPASE, AMYLASE in the last 168 hours. No results for input(s): AMMONIA in the last 168 hours. Coagulation Profile: No results for input(s): INR, PROTIME in the last 168 hours. Cardiac Enzymes: Recent Labs  Lab 04/26/18 0802 04/26/18 1011 04/26/18 1352 04/26/18 2154  TROPONINI 0.18* 0.15* 0.16* 0.15*   BNP (last 3 results) No results for input(s): PROBNP in the last 8760 hours. HbA1C: Recent Labs    04/26/18 1011 04/27/18 0502  HGBA1C 11.0* 10.9*   CBG: Recent Labs  Lab 04/27/18 1141 04/27/18 1635 04/27/18 2146 04/28/18 0738 04/28/18 1149  GLUCAP 318* 267* 189* 172* 173*   Lipid Profile: Recent Labs    04/26/18 1011  CHOL 176  HDL 45  LDLCALC 111*  TRIG 101  CHOLHDL 3.9   Thyroid Function Tests: No results for input(s): TSH, T4TOTAL, FREET4, T3FREE, THYROIDAB in the last 72 hours. Anemia Panel: No results for input(s): VITAMINB12, FOLATE, FERRITIN, TIBC, IRON, RETICCTPCT in the last 72 hours. Urine analysis: No results found for: COLORURINE, APPEARANCEUR, LABSPEC, [place], GLUCOSEU, HGBUR, BILIRUBINUR, KETONESUR, PROTEINUR, UROBILINOGEN, NITRITE, LEUKOCYTESUR Recent Results (from the past 240 hour(s))  MRSA PCR Screening     Status: None   Collection Time: 04/26/18  6:48 PM  Result Value Ref Range Status  MRSA by PCR NEGATIVE NEGATIVE Final    Comment:        The GeneXpert MRSA Assay (FDA approved for NASAL specimens only), is one component of a comprehensive MRSA colonization surveillance program. It is not intended to diagnose MRSA infection nor to guide or monitor treatment for MRSA infections. Performed at [facility], [address], [place], [place] [address]       Radiology Studies: No results found.  Scheduled Meds: . aspirin  81 mg Oral Daily  . atorvastatin  40 mg Oral q1800  . carvedilol  3.125 mg Oral BID WC  . enoxaparin (LOVENOX) injection  40 mg Subcutaneous Q24H  . furosemide  60 mg Intravenous BID  .  hydrALAZINE  25 mg Oral Q8H  . insulin aspart  0-15 Units Subcutaneous TID WC  . insulin aspart  0-5 Units Subcutaneous QHS  . insulin glargine  10 Units Subcutaneous Daily  . lisinopril  5 mg Oral Daily  . potassium chloride  40 mEq Oral Daily  . sodium chloride flush  3 mL Intravenous Q12H   Continuous Infusions: . sodium chloride    . nitroGLYCERIN Stopped (04/27/18 1445)     LOS: 2 days   Time spent: 35 minutes.  Patrecia Pour, MD [facility] [email or web address] Password [email or web address] 04/28/2018, 12:12 PM

## 2018-04-29 LAB — BASIC METABOLIC PANEL
Anion gap: 8 (ref 5–15)
BUN: 23 mg/dL (ref 8–23)
CO2: 27 mmol/L (ref 22–32)
Calcium: 8.3 mg/dL — ABNORMAL LOW (ref 8.9–10.3)
Chloride: 101 mmol/L (ref 98–111)
Creatinine, Ser: 1.5 mg/dL — ABNORMAL HIGH (ref 0.61–1.24)
GFR calc Af Amer: 56 mL/min — ABNORMAL LOW (ref >60)
GFR calc non Af Amer: 49 mL/min — ABNORMAL LOW (ref >60)
Glucose, Bld: 220 mg/dL — ABNORMAL HIGH (ref 70–99)
Potassium: 3.5 mmol/L (ref 3.5–5.1)
Sodium: 136 mmol/L (ref 135–145)

## 2018-04-29 LAB — GLUCOSE, CAPILLARY
Glucose-Capillary: 199 mg/dL — ABNORMAL HIGH (ref 70–99)
Glucose-Capillary: 266 mg/dL — ABNORMAL HIGH (ref 70–99)
Glucose-Capillary: 250 mg/dL — ABNORMAL HIGH (ref 70–99)
Glucose-Capillary: 233 mg/dL — ABNORMAL HIGH (ref 70–99)

## 2018-04-29 MED ORDER — CARVEDILOL 3.125 MG PO TABS
6.2500 mg | ORAL_TABLET | Freq: Two times a day (BID) | ORAL | Status: DC
  Administered 2018-04-29 – 2018-04-30 (×3): 6.25 mg via ORAL
  Filled 2018-04-29 (×3): qty 2

## 2018-04-29 MED ORDER — LISINOPRIL 5 MG PO TABS
5.0000 mg | ORAL_TABLET | Freq: Once | ORAL | Status: AC
  Administered 2018-04-29: 5 mg via ORAL
  Filled 2018-04-29: qty 1

## 2018-04-29 MED ORDER — LISINOPRIL 10 MG PO TABS
10.0000 mg | ORAL_TABLET | Freq: Every day | ORAL | Status: DC
  Administered 2018-04-29 – 2018-05-02 (×4): 10 mg via ORAL
  Filled 2018-04-29 (×4): qty 1

## 2018-04-29 MED ORDER — HYDRALAZINE HCL 20 MG/ML IJ SOLN: 10.0000 mg | INTRAMUSCULAR | Status: DC | PRN

## 2018-04-29 NOTE — Progress Notes (Signed)
PROGRESS NOTE  [name]  BZJ:[id number] DOB: Dec 20, 1955 DOA: 04/26/2018 PCP: [name], MD  Brief Narrative: [name] is a 62 y.o. male with a history of diabetes and HTN who presented to the ED with dyspnea and associated chest discomfort. He's had a couple weeks of feet swelling progressing to the leg and most recently to the abdomen and began requiring elevation of his head to sleep. He describes orthopnea for 2 weeks which has been associated with nonproductive cough and last night he had PND, awoke with abrupt sensation of dyspnea associated with chest pain described as constant, mild-moderate heaviness that lasted a few hours and subsided without further intervention. He's been taking no medications lately as he transitions PCP, has appt scheduled on Monday.   ED Course: Markedly hypertensive with modest improvement on nitro gtt, not hypoxic but has crackles on exam and is tachypneic. CXR consistent with pulmonary edema, BNP markedly elevated >2k, troponin elevated at 0.16, stable trend, and diffuse nonspecific ST-T changes on ECG. Cardiology consulted, diuresis started, and hospitalists called to admit.   Hospital Course: Started on nitroglycerin infusion with improved BP, weaned 11/15 with initiation of coreg, lisinopril, hydralazine. He remained volume overloaded, so lasix increased with brisk diuresis on 11/16. Remains hypertensive despite titration of coreg, hydralazine, and lisinopril.  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 BNP grossly  elevated at 2,062, typical exam and CXR findings for CHF, and blunted IVC on echo.  - Cardiology consulted. Lasix augmented to 60mg  IV BID 11/16 with ~3L UOP/24hrs, down 9lbs, renal function stable, swelling improved but remains grossly overloaded with PND.  Will continue this dose at least another 24 hours.  - Started BB, low dose lisinopril, hydralazine. Remains elevated, occasionally in severe range, suspect somewhat related to volume but clearly has resistant HTN. Augmented coreg, hydralazine, and lisinopril in the past 24 hours. If remains elevated, would start isosorbide.  Hypertension with hypertensive urgency: Weaned off nitroglycerin gtt, started 3 drug regimen.  - Augment antihypertensives as above. - Will need ongoing close monitoring of BP and renal function/K with PCP after discharge. Had appt to establish care with Dr. Wolfgang Phoenix 11/18, though will need to be inpatient and will need to reschedule this.   Elevated troponin: Suspect demand ischemia due to HTN urgency and CHF exacerbation. Trend is flat - On ASA, statin, planning nuclear stress testing as outpatient.   T2DM uncontrolled with hyperglycemia: HbA1c is 11%, consistent with AVERAGE blood sugar of ~270mg /dl. Discussed at length with patient. I suspect insulin will be necessary if lifestyle modifications aren't sufficient, but will start with oral meds at discharge.  - If creatinine tolerates, will start metformin, glipizide and have CBGs checked at home. - Dietitian, diabetes coordinator consulted - Augmented SSI and started basal insulin. Increase lantus to 15u based on elevated AM CBG. - Hold metformin for now  Hypokalemia: Improved with replacement.  - Continue supplement K in light of diuresis.  Hyperlipidemia:  - Starting statin especially in light of DM, LDL 111.  Elevated creatinine: Worsening with diuresis. At high risk for CKD with HTN and DM.  - Monitor Cr  with diuresis, would benefit from risk factor  control  Obesity: Reported episodes of apnea.  - Strongly recommend sleep study as an outpatient.  DVT prophylaxis: Lovenox Code Status: Full Family Communication: SO at bedside Disposition Plan: Home once euvolemic. Needs continued diuresis and multiple medication titration.  Consultants:   Cardiology  Procedures:   None  Antimicrobials:  None   Subjective: Awoke several times last night short of breath abruptly. Leg swelling improved but much larger than baseline still. No chest pain or palpitations.   Objective: Vitals:   04/28/18 2103 04/28/18 2146 04/29/18 0134 04/29/18 0635  BP: (!) 183/116 (!) 168/92 (!) 173/100 (!) 175/116  Pulse: 87  85 86  Resp: 18   18  Temp:    99.7 F (37.6 C)  TempSrc:    Oral  SpO2: 97%   95%  Weight:    96.8 kg  Height:        Intake/Output Summary (Last 24 hours) at 04/29/2018 1042 Last data filed at 04/29/2018 0934 Gross per 24 hour  Intake 600 ml  Output 2975 ml  Net -2375 ml   Filed Weights   04/27/18 0500 04/28/18 0500 04/29/18 0635  Weight: 100.8 kg 98.9 kg 96.8 kg   Gen: 62 y.o. male in no distress Pulm: Nonlabored breathing room air. Clear bilaterally when sitting up. CV: Regular rate and rhythm. No murmur, rub, or gallop. Continues to have JVD, dependent edema through thighs bilaterally. GI: Abdomen soft, non-tender, non-distended, with normoactive bowel sounds.  Ext: Warm, no deformities Skin: No rashes, lesions or ulcers on visualized skin.  Neuro: Alert and oriented. No focal neurological deficits. Psych: Judgement and insight appear fair. Mood euthymic & affect congruent. Behavior is appropriate.    Data Reviewed: I have personally reviewed following labs and imaging studies  CBC: Recent Labs  Lab 04/26/18 0800  WBC 7.3  HGB 13.5  HCT 42.6  MCV 82.2  PLT 818   Basic Metabolic Panel: Recent Labs  Lab 04/26/18 0800 04/27/18 0502 04/28/18 0440 04/29/18 0602  NA 137 140 138 136  K 3.2* 2.9* 3.6  3.5  CL 105 106 106 101  CO2 24 24 25 27   GLUCOSE 300* 215* 198* 220*  BUN 17 18 19 23   CREATININE 1.34* 1.50* 1.49* 1.50*  CALCIUM 9.1 8.2* 8.3* 8.3*  MG  --  1.7 2.0  --    GFR: Estimated Creatinine Clearance: 61.4 mL/min (A) (by C-G formula based on SCr of 1.5 mg/dL (H)). Liver Function Tests: No results for input(s): AST, ALT, ALKPHOS, BILITOT, PROT, ALBUMIN in the last 168 hours. No results for input(s): LIPASE, AMYLASE in the last 168 hours. No results for input(s): AMMONIA in the last 168 hours. Coagulation Profile: No results for input(s): INR, PROTIME in the last 168 hours. Cardiac Enzymes: Recent Labs  Lab 04/26/18 0802 04/26/18 1011 04/26/18 1352 04/26/18 2154  TROPONINI 0.18* 0.15* 0.16* 0.15*   BNP (last 3 results) No results for input(s): PROBNP in the last 8760 hours. HbA1C: Recent Labs    04/27/18 0502  HGBA1C 10.9*   CBG: Recent Labs  Lab 04/28/18 0738 04/28/18 1149 04/28/18 1615 04/28/18 2059 04/29/18 0724  GLUCAP 172* 173* 277* 164* 199*   Lipid Profile: No results for input(s): CHOL, HDL, LDLCALC, TRIG, CHOLHDL, LDLDIRECT in the last 72 hours. Thyroid Function Tests: No results for input(s): TSH, T4TOTAL, FREET4, T3FREE, THYROIDAB in the last 72 hours. Anemia Panel: No results for input(s): VITAMINB12, FOLATE, FERRITIN, TIBC, IRON, RETICCTPCT in the  last 72 hours. Urine analysis: No results found for: COLORURINE, APPEARANCEUR, [place], [place], GLUCOSEU, [place], [place], [place], [place], [place], NITRITE, LEUKOCYTESUR Recent Results (from the past 240 hour(s))  MRSA PCR Screening     Status: None   Collection Time: 04/26/18  6:48 PM  Result Value Ref Range Status   MRSA by PCR NEGATIVE NEGATIVE Final    Comment:        The GeneXpert MRSA Assay (FDA approved for NASAL specimens only), is one component of a comprehensive MRSA colonization surveillance program. It is not intended to diagnose MRSA infection nor to guide  or monitor treatment for MRSA infections. Performed at [facility], [address], [place], [place] [address]       Radiology Studies: No results found.  Scheduled Meds: . aspirin  81 mg Oral Daily  . atorvastatin  40 mg Oral q1800  . carvedilol  6.25 mg Oral BID WC  . enoxaparin (LOVENOX) injection  40 mg Subcutaneous Q24H  . furosemide  60 mg Intravenous BID  . hydrALAZINE  50 mg Oral Q8H  . insulin aspart  0-15 Units Subcutaneous TID WC  . insulin aspart  0-5 Units Subcutaneous QHS  . insulin glargine  12 Units Subcutaneous Daily  . lisinopril  10 mg Oral Daily  . sodium chloride flush  3 mL Intravenous Q12H   Continuous Infusions: . sodium chloride       LOS: 3 days   Time spent: 35 minutes.  Patrecia Pour, MD [facility] [email or web address] Password [email or web address] 04/29/2018, 10:42 AM

## 2018-04-29 NOTE — Progress Notes (Signed)
Patient's  blood pressure continues to stay elevated 160's /100/s even though he has had his scheduled Hydralazine . Dr Darrick Meigs informed of this via [place]. Patient asymptomatic with no noted distress of any kind

## 2018-04-30 ENCOUNTER — Encounter

## 2018-04-30 ENCOUNTER — Ambulatory Visit: Payer: Self-pay | Admitting: Family Medicine

## 2018-04-30 LAB — BASIC METABOLIC PANEL
Anion gap: 9 (ref 5–15)
BUN: 21 mg/dL (ref 8–23)
CO2: 30 mmol/L (ref 22–32)
Calcium: 8.6 mg/dL — ABNORMAL LOW (ref 8.9–10.3)
Chloride: 101 mmol/L (ref 98–111)
Creatinine, Ser: 1.54 mg/dL — ABNORMAL HIGH (ref 0.61–1.24)
GFR calc Af Amer: 54 mL/min — ABNORMAL LOW (ref >60)
GFR calc non Af Amer: 47 mL/min — ABNORMAL LOW (ref >60)
Glucose, Bld: 151 mg/dL — ABNORMAL HIGH (ref 70–99)
Potassium: 3.6 mmol/L (ref 3.5–5.1)
Sodium: 140 mmol/L (ref 135–145)

## 2018-04-30 LAB — GLUCOSE, CAPILLARY
Glucose-Capillary: 164 mg/dL — ABNORMAL HIGH (ref 70–99)
Glucose-Capillary: 203 mg/dL — ABNORMAL HIGH (ref 70–99)
Glucose-Capillary: 193 mg/dL — ABNORMAL HIGH (ref 70–99)
Glucose-Capillary: 261 mg/dL — ABNORMAL HIGH (ref 70–99)

## 2018-04-30 LAB — TSH: TSH: 2.11 u[IU]/mL (ref 0.350–4.500)

## 2018-04-30 MED ORDER — CARVEDILOL 3.125 MG PO TABS
6.2500 mg | ORAL_TABLET | Freq: Once | ORAL | Status: AC
  Administered 2018-04-30: 6.25 mg via ORAL
  Filled 2018-04-30: qty 2

## 2018-04-30 MED ORDER — INSULIN ASPART 100 UNIT/ML ~~LOC~~ SOLN
3.0000 [IU] | Freq: Three times a day (TID) | SUBCUTANEOUS | Status: DC
  Administered 2018-04-30 – 2018-05-01 (×5): 3 [IU] via SUBCUTANEOUS

## 2018-04-30 MED ORDER — CARVEDILOL 12.5 MG PO TABS
12.5000 mg | ORAL_TABLET | Freq: Two times a day (BID) | ORAL | Status: DC
  Administered 2018-04-30 – 2018-05-02 (×4): 12.5 mg via ORAL
  Filled 2018-04-30 (×4): qty 1

## 2018-04-30 MED ORDER — POTASSIUM CHLORIDE CRYS ER 20 MEQ PO TBCR
40.0000 meq | EXTENDED_RELEASE_TABLET | Freq: Once | ORAL | Status: AC
  Administered 2018-04-30: 40 meq via ORAL
  Filled 2018-04-30: qty 2

## 2018-04-30 NOTE — Progress Notes (Signed)
Progress Note  Patient Name: [name] Date of Encounter: 04/30/2018  Primary Cardiologist: [name], MD   Subjective   Ongoing SOB, orthopnea  Inpatient Medications    Scheduled Meds: . aspirin  81 mg Oral Daily  . atorvastatin  40 mg Oral q1800  . carvedilol  6.25 mg Oral BID WC  . enoxaparin (LOVENOX) injection  40 mg Subcutaneous Q24H  . furosemide  60 mg Intravenous BID  . hydrALAZINE  50 mg Oral Q8H  . insulin aspart  0-15 Units Subcutaneous TID WC  . insulin aspart  0-5 Units Subcutaneous QHS  . insulin aspart  3 Units Subcutaneous TID WC  . insulin glargine  12 Units Subcutaneous Daily  . lisinopril  10 mg Oral Daily  . sodium chloride flush  3 mL Intravenous Q12H   Continuous Infusions: . sodium chloride     PRN Meds: sodium chloride, acetaminophen, guaiFENesin-dextromethorphan, hydrALAZINE, ondansetron (ZOFRAN) IV, oxyCODONE, sodium chloride flush   Vital Signs    Vitals:   04/29/18 1427 04/29/18 2148 04/30/18 0530 04/30/18 0614  BP: (!) 154/91 (!) 134/114 (!) 177/109 (!) 150/95  Pulse: 86 82 82 83  Resp: 18  18 18   Temp: 98.2 F (36.8 C) 98.4 F (36.9 C) 98.3 F (36.8 C) (!) 97.5 F (36.4 C)  TempSrc: Oral Oral Oral Oral  SpO2: 96% 100% 95% 96%  Weight:    92.9 kg  Height:        Intake/Output Summary (Last 24 hours) at 04/30/2018 0817 Last data filed at 04/29/2018 2300 Gross per 24 hour  Intake 720 ml  Output 3450 ml  Net -2730 ml   Filed Weights   04/28/18 0500 04/29/18 0635 04/30/18 0614  Weight: 98.9 kg 96.8 kg 92.9 kg    Telemetry    NSR - Personally Reviewed  ECG    na  Physical Exam   GEN: No acute distress.   Neck: elevated JVD Cardiac: RRR, no murmurs, rubs, or gallops.  Respiratory: Clear to auscultation bilaterally. GI: Soft, nontender, non-distended  MS: 1+ bilateral leg edema; No deformity. Neuro:  Nonfocal  Psych: Normal affect   Labs    Chemistry Recent Labs  Lab 04/28/18 0440  04/29/18 0602 04/30/18 0500  NA 138 136 140  K 3.6 3.5 3.6  CL 106 101 101  CO2 25 27 30   GLUCOSE 198* 220* 151*  BUN 19 23 21   CREATININE 1.49* 1.50* 1.54*  CALCIUM 8.3* 8.3* 8.6*  GFRNONAA 49* 49* 47*  GFRAA 57* 56* 54*  ANIONGAP 7 8 9      Hematology Recent Labs  Lab 04/26/18 0800  WBC 7.3  RBC 5.18  HGB 13.5  HCT 42.6  MCV 82.2  MCH 26.1  MCHC 31.7  RDW 14.4  PLT 310    Cardiac Enzymes Recent Labs  Lab 04/26/18 0802 04/26/18 1011 04/26/18 1352 04/26/18 2154  TROPONINI 0.18* 0.15* 0.16* 0.15*    Recent Labs  Lab 04/26/18 0804  TROPIPOC 0.16*     BNP Recent Labs  Lab 04/26/18 0802  BNP 2,062.0*     DDimer No results for input(s): DDIMER in the last 168 hours.   Radiology    No results found.  Cardiac Studies     Patient Profile     62 y.o. male with a hx of hypertension and type 2 diabetes mellituswho is being seen today for the evaluation of CHFat the request of Dr. Thurnell Garbe.  Assessment & Plan    1. Acute combined  systolic/diastolic HF - 74/0814 echo LVE 45-50%, abnormal diastolic function indeterminant grade, mild MR, dilated IVC - started on coreg 6.25mg  bid, lisinopril 10mg  daily, hydralaine 50mg  tid this admission - plans for outpatient stress test per Dr Bronson Ing. Trops 0.15 to 0.18 in setting of HTN emergency and CHF, CKD. Overall not specific for ACS  - I/Os incomplete this admission. Negative 2.7 L yesterday, negetiva 8.1 L since admission from documentation. He is on lasix 60mg  IV bid, mild uptrend in Cr - remains volume overloaded by exam and symptoms, continue IV lasix today. Likely convert to oral within the next 1-2 days.    2. Hypertensive emergency - presented with severe HTN SBP neear 200, pulm edema. Initially on NG drip, now off - bp's remain elevated. Limited dosing of ACE-I given CKD. Increase coreg to 12.5mg  bid. May titrate hydral further if needed.   3. CKD III - admit Cr 1.3, baseline available is from 8  years ago. Suspected CKD III - mild uptrend in Cr with diuresis, continue to monitor    For questions or updates, please contact [place] Please consult [email or web address] for contact info under        Signed, Carlyle Dolly, MD  04/30/2018, 8:17 AM

## 2018-04-30 NOTE — Progress Notes (Signed)
PROGRESS NOTE  [name]  ZHG:[id number] DOB: 02-15-1956 DOA: 04/26/2018 PCP: [name], MD  Brief Narrative: [name] is a 62 y.o. male with a history of diabetes and HTN who presented to the ED with dyspnea and associated chest discomfort. He's had a couple weeks of feet swelling progressing to the leg and most recently to the abdomen and began requiring elevation of his head to sleep. He describes orthopnea for 2 weeks which has been associated with nonproductive cough and last night he had PND, awoke with abrupt sensation of dyspnea associated with chest pain described as constant, mild-moderate heaviness that lasted a few hours and subsided without further intervention. He's been taking no medications lately as he transitions PCP, has appt scheduled on Monday.   ED Course: Markedly hypertensive with modest improvement on nitro gtt, not hypoxic but has crackles on exam and is tachypneic. CXR consistent with pulmonary edema, BNP markedly elevated >2k, troponin elevated at 0.16, stable trend, and diffuse nonspecific ST-T changes on ECG. Cardiology consulted, diuresis started, and hospitalists called to admit.   Hospital Course: Started on nitroglycerin infusion with improved BP, weaned 11/15 with initiation of coreg, lisinopril, hydralazine. He remained volume overloaded, so lasix increased with brisk diuresis on 11/16. Remains hypertensive continuing titration of coreg, hydralazine, and lisinopril.  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 BNP grossly  elevated at 2,062, typical exam and CXR findings for CHF, and blunted IVC on echo.  - Cardiology consulted. Lasix augmented to 60mg  IV BID 11/16. Continues good diuresis, -3.45L/24hrs, down 222 > 204lbs, slight uptrending creatinine. Still overloaded on exam. Will continue IV BID for now and monitor.  - Started BB, ACE, hydralazine  Hypertension with hypertensive urgency: Weaned off nitroglycerin gtt, started 3 drug regimen.  - Remains elevated, so increasing coreg, continue lisinopril and hydralazine. If remains elevated, would start isosorbide vs. augment hydralazine. - Will need ongoing close monitoring of BP and renal function/K with PCP after discharge. Had appt to establish care with Dr. Wolfgang Phoenix 11/18, rescheduled for 11/26.  Elevated troponin: Suspect demand ischemia due to HTN urgency and CHF exacerbation. Trend is flat - On ASA, statin, planning nuclear stress testing as outpatient.   T2DM uncontrolled with hyperglycemia: HbA1c is 11%, consistent with AVERAGE blood sugar of ~270mg /dl. Discussed at length with patient. I suspect insulin will be necessary if lifestyle modifications aren't sufficient, but will start with oral meds at discharge.  - If creatinine tolerates, will start metformin, glipizide and have CBGs checked at home. - Dietitian, diabetes coordinator consulted - Augmented SSI and started basal insulin. Start mealtime insulin in addition to moderate SSI and lantus 12u. AM CBG ~150mg /dl.  - Hold metformin for now  Hypokalemia: Improved with replacement.  - Continue supplement K again today with ongoing IV diuresis.  Hyperlipidemia:  - Starting statin especially in light of DM, LDL 111.  Elevated creatinine: 1.34 on admission, up to 1.5 the following day and relatively stable since that time. At high risk for CKD with HTN and DM.  - Monitor Cr with diuresis, would benefit from risk factor control  Obesity: Reported episodes of  apnea.  - Strongly recommend sleep  study as an outpatient.  DVT prophylaxis: Lovenox Code Status: Full Family Communication: SO at bedside Disposition Plan: Home once euvolemic. Continue aggressive IV diuresis.  Consultants:   Cardiology  Procedures:   None  Antimicrobials:  None   Subjective: Still severe shortness of breath, paroxysms of dyspnea and nonproductive coughing when trying to sleep. Leg swelling is improving, but no where near his premorbid normal. No chest pain or palpitations.   Objective: Vitals:   04/29/18 2148 04/30/18 0530 04/30/18 0614 04/30/18 0900  BP: (!) 134/114 (!) 177/109 (!) 150/95 (!) 122/95  Pulse: 82 82 83 91  Resp:  18 18 16   Temp: 98.4 F (36.9 C) 98.3 F (36.8 C) (!) 97.5 F (36.4 C)   TempSrc: Oral Oral Oral   SpO2: 100% 95% 96% 93%  Weight:   92.9 kg   Height:        Intake/Output Summary (Last 24 hours) at 04/30/2018 1222 Last data filed at 04/30/2018 7253 Gross per 24 hour  Intake 720 ml  Output 4250 ml  Net -3530 ml   Filed Weights   04/28/18 0500 04/29/18 0635 04/30/18 0614  Weight: 98.9 kg 96.8 kg 92.9 kg   Gen: 62 y.o. male in no distress Pulm: Nonlabored breathing room air. Clear, diminished. CV: Regular rate and rhythm. No murmur, rub, or gallop. +JVD, 1+ R> L dependent edema. GI: Abdomen soft, non-tender, non-distended, with normoactive bowel sounds.  Ext: Warm, no deformities Skin: No rashes, lesions or ulcers on visualized skin.  Neuro: Alert and oriented. No focal neurological deficits. Psych: Judgement and insight appear fair. Mood euthymic & affect congruent. Behavior is appropriate.    Data Reviewed: I have personally reviewed following labs and imaging studies  CBC: Recent Labs  Lab 04/26/18 0800  WBC 7.3  HGB 13.5  HCT 42.6  MCV 82.2  PLT 664   Basic Metabolic Panel: Recent Labs  Lab 04/26/18 0800 04/27/18 0502 04/28/18 0440 04/29/18 0602 04/30/18 0500  NA 137 140 138 136 140  K 3.2* 2.9* 3.6 3.5 3.6  CL 105 106 106 101  101  CO2 24 24 25 27 30   GLUCOSE 300* 215* 198* 220* 151*  BUN 17 18 19 23 21   CREATININE 1.34* 1.50* 1.49* 1.50* 1.54*  CALCIUM 9.1 8.2* 8.3* 8.3* 8.6*  MG  --  1.7 2.0  --   --    GFR: Estimated Creatinine Clearance: 58.6 mL/min (A) (by C-G formula based on SCr of 1.54 mg/dL (H)). Liver Function Tests: No results for input(s): AST, ALT, ALKPHOS, BILITOT, PROT, ALBUMIN in the last 168 hours. No results for input(s): LIPASE, AMYLASE in the last 168 hours. No results for input(s): AMMONIA in the last 168 hours. Coagulation Profile: No results for input(s): INR, PROTIME in the last 168 hours. Cardiac Enzymes: Recent Labs  Lab 04/26/18 0802 04/26/18 1011 04/26/18 1352 04/26/18 2154  TROPONINI 0.18* 0.15* 0.16* 0.15*   BNP (last 3 results) No results for input(s): PROBNP in the last 8760 hours. HbA1C: No results for input(s): HGBA1C in the last 72 hours. CBG: Recent Labs  Lab 04/29/18 1141 04/29/18 1609 04/29/18 2038 04/30/18 0732 04/30/18 1108  GLUCAP 266* 250* 233* 164* 203*   Lipid Profile: No results for input(s): CHOL, HDL, LDLCALC, TRIG, CHOLHDL, LDLDIRECT in the last 72 hours. Thyroid Function Tests: Recent Labs    04/29/18 0900  TSH 2.110   Anemia Panel: No results for input(s): VITAMINB12, FOLATE, FERRITIN, TIBC, IRON, RETICCTPCT  in the last 72 hours. Urine analysis: No results found for: COLORURINE, APPEARANCEUR, [place], [place], GLUCOSEU, [place], [place], [place], [place], [place], NITRITE, LEUKOCYTESUR Recent Results (from the past 240 hour(s))  MRSA PCR Screening     Status: None   Collection Time: 04/26/18  6:48 PM  Result Value Ref Range Status   MRSA by PCR NEGATIVE NEGATIVE Final    Comment:        The GeneXpert MRSA Assay (FDA approved for NASAL specimens only), is one component of a comprehensive MRSA colonization surveillance program. It is not intended to diagnose MRSA infection nor to guide or monitor treatment for MRSA  infections. Performed at [facility], [address], [place], [place] [address]       Radiology Studies: No results found.  Scheduled Meds: . aspirin  81 mg Oral Daily  . atorvastatin  40 mg Oral q1800  . carvedilol  12.5 mg Oral BID WC  . enoxaparin (LOVENOX) injection  40 mg Subcutaneous Q24H  . furosemide  60 mg Intravenous BID  . hydrALAZINE  50 mg Oral Q8H  . insulin aspart  0-15 Units Subcutaneous TID WC  . insulin aspart  0-5 Units Subcutaneous QHS  . insulin aspart  3 Units Subcutaneous TID WC  . insulin glargine  12 Units Subcutaneous Daily  . lisinopril  10 mg Oral Daily  . sodium chloride flush  3 mL Intravenous Q12H   Continuous Infusions: . sodium chloride       LOS: 4 days   Time spent: 25 minutes.  Patrecia Pour, MD [facility] [email or web address] Password [facility] 04/30/2018, 12:22 PM

## 2018-05-01 LAB — BASIC METABOLIC PANEL
Anion gap: 9 (ref 5–15)
BUN: 23 mg/dL (ref 8–23)
CO2: 28 mmol/L (ref 22–32)
Calcium: 9.1 mg/dL (ref 8.9–10.3)
Chloride: 102 mmol/L (ref 98–111)
Creatinine, Ser: 1.69 mg/dL — ABNORMAL HIGH (ref 0.61–1.24)
GFR calc Af Amer: 49 mL/min — ABNORMAL LOW (ref >60)
GFR calc non Af Amer: 42 mL/min — ABNORMAL LOW (ref >60)
Glucose, Bld: 191 mg/dL — ABNORMAL HIGH (ref 70–99)
Potassium: 4.3 mmol/L (ref 3.5–5.1)
Sodium: 139 mmol/L (ref 135–145)

## 2018-05-01 LAB — GLUCOSE, CAPILLARY
Glucose-Capillary: 195 mg/dL — ABNORMAL HIGH (ref 70–99)
Glucose-Capillary: 281 mg/dL — ABNORMAL HIGH (ref 70–99)
Glucose-Capillary: 151 mg/dL — ABNORMAL HIGH (ref 70–99)
Glucose-Capillary: 260 mg/dL — ABNORMAL HIGH (ref 70–99)

## 2018-05-01 MED ORDER — FUROSEMIDE 40 MG PO TABS: 60.0000 mg | ORAL_TABLET | Freq: Two times a day (BID) | ORAL | Status: DC

## 2018-05-01 MED ORDER — FUROSEMIDE 40 MG PO TABS
60.0000 mg | ORAL_TABLET | Freq: Two times a day (BID) | ORAL | Status: DC
  Administered 2018-05-01 – 2018-05-02 (×3): 60 mg via ORAL
  Filled 2018-05-01 (×3): qty 1

## 2018-05-01 MED ORDER — INSULIN ASPART 100 UNIT/ML ~~LOC~~ SOLN
6.0000 [IU] | Freq: Three times a day (TID) | SUBCUTANEOUS | Status: DC
  Administered 2018-05-01 – 2018-05-02 (×3): 6 [IU] via SUBCUTANEOUS

## 2018-05-01 MED ORDER — HYDRALAZINE HCL 25 MG PO TABS
25.0000 mg | ORAL_TABLET | Freq: Once | ORAL | Status: AC
  Administered 2018-05-01: 25 mg via ORAL
  Filled 2018-05-01: qty 1

## 2018-05-01 MED ORDER — HYDRALAZINE HCL 25 MG PO TABS
75.0000 mg | ORAL_TABLET | Freq: Three times a day (TID) | ORAL | Status: DC
  Administered 2018-05-01 – 2018-05-02 (×4): 75 mg via ORAL
  Filled 2018-05-01 (×4): qty 3

## 2018-05-01 NOTE — Progress Notes (Signed)
Progress Note  Patient Name: [name] Date of Encounter: 05/01/2018  Primary Cardiologist: [name], MD    Subjective   SOB and orthopnea improving but not resolved  Inpatient Medications    Scheduled Meds: . aspirin  81 mg Oral Daily  . atorvastatin  40 mg Oral q1800  . carvedilol  12.5 mg Oral BID WC  . enoxaparin (LOVENOX) injection  40 mg Subcutaneous Q24H  . furosemide  60 mg Intravenous BID  . hydrALAZINE  25 mg Oral Once  . hydrALAZINE  75 mg Oral Q8H  . insulin aspart  0-15 Units Subcutaneous TID WC  . insulin aspart  0-5 Units Subcutaneous QHS  . insulin aspart  3 Units Subcutaneous TID WC  . insulin glargine  12 Units Subcutaneous Daily  . lisinopril  10 mg Oral Daily  . sodium chloride flush  3 mL Intravenous Q12H   Continuous Infusions: . sodium chloride     PRN Meds: sodium chloride, acetaminophen, guaiFENesin-dextromethorphan, hydrALAZINE, ondansetron (ZOFRAN) IV, oxyCODONE, sodium chloride flush   Vital Signs    Vitals:   04/30/18 2114 05/01/18 0530 05/01/18 0637 05/01/18 0637  BP: (!) 147/84 (!) 167/107  (!) 165/100  Pulse: 81 83  81  Resp:      Temp: 98 F (36.7 C) 98.5 F (36.9 C)    TempSrc: Oral Oral    SpO2: 93% 93%  95%  Weight:   93.6 kg   Height:        Intake/Output Summary (Last 24 hours) at 05/01/2018 0832 Last data filed at 04/30/2018 2100 Gross per 24 hour  Intake 720 ml  Output 1800 ml  Net -1080 ml   Filed Weights   04/29/18 0635 04/30/18 0614 05/01/18 0637  Weight: 96.8 kg 92.9 kg 93.6 kg    Telemetry    SR - Personally Reviewed  ECG    na  Physical Exam   GEN: No acute distress.   Neck: No JVD Cardiac: RRR, no murmurs, rubs, or gallops.  Respiratory: Clear to auscultation bilaterally. GI: Soft, nontender, non-distended  MS: Trace bilateral edema; No deformity. Neuro:  Nonfocal  Psych: Normal affect   Labs    Chemistry Recent Labs  Lab 04/29/18 0602 04/30/18 0500 05/01/18 0547    NA 136 140 139  K 3.5 3.6 4.3  CL 101 101 102  CO2 27 30 28   GLUCOSE 220* 151* 191*  BUN 23 21 23   CREATININE 1.50* 1.54* 1.69*  CALCIUM 8.3* 8.6* 9.1  GFRNONAA 49* 47* 42*  GFRAA 56* 54* 49*  ANIONGAP 8 9 9      Hematology Recent Labs  Lab 04/26/18 0800  WBC 7.3  RBC 5.18  HGB 13.5  HCT 42.6  MCV 82.2  MCH 26.1  MCHC 31.7  RDW 14.4  PLT 310    Cardiac Enzymes Recent Labs  Lab 04/26/18 0802 04/26/18 1011 04/26/18 1352 04/26/18 2154  TROPONINI 0.18* 0.15* 0.16* 0.15*    Recent Labs  Lab 04/26/18 0804  TROPIPOC 0.16*     BNP Recent Labs  Lab 04/26/18 0802  BNP 2,062.0*     DDimer No results for input(s): DDIMER in the last 168 hours.   Radiology    No results found.  Cardiac Studies     Patient Profile     62 y.o.malewith a hx of hypertension and type 2 diabetes mellituswho is being seen today for the evaluation of CHFat the request of Dr. Thurnell Garbe.  Assessment & Plan  1. Acute combined systolic/diastolic HF - 69/7948 echo LVE 45-50%, abnormal diastolic function indeterminant grade, mild MR, dilated IVC - started on coreg 12.5 g bid, lisinopril 10mg  daily, hydralaine 50mg  tid this admission - plans for outpatient stress test per Dr Bronson Ing. Trops 0.15 to 0.18 in setting of HTN emergency and CHF, CKD. Overall not specific for ACS  - I/Os incomplete this admission. Negative 1 L yesterday, negetiva 9.2 L since admission from documentation. He is on lasix 60mg  IV bid, uptrend in Cr - remains mildly overloaded. We will change to oral lasix 60mg  bid  (first dose this evening). Have patient ambulate with nursing staff. Follow symptoms and renal function overnight, would anticipate discharge tomorrow with close f/u.   2. Hypertensive emergency - presented with severe HTN SBP neear 200, pulm edema. Initially on NG drip, now off - bp's remain elevated. Limited dosing of ACE-I given CKD. - bp's remain elevated, increase hydrlazine to 75mg   tid   3. CKD III - admit Cr 1.3, baseline available is from 8 years ago. Suspected CKD III - mild uptrend in Cr with diuresis. Changing to oral lasix today.     For questions or updates, please contact [place] Please consult [email or web address] for contact info under        Signed, Carlyle Dolly, MD  05/01/2018, 8:32 AM

## 2018-05-01 NOTE — Progress Notes (Signed)
PROGRESS NOTE  [name]  UQJ:[id number] DOB: [birth year] DOA: 04/26/2018 PCP: [name], MD  Brief Narrative: [name] is a 62 y.o. male with a history of diabetes and HTN who presented to the ED with dyspnea and associated chest discomfort. He's had a couple weeks of feet swelling progressing to the leg and most recently to the abdomen and began requiring elevation of his head to sleep. He describes orthopnea for 2 weeks which has been associated with nonproductive cough and last night he had PND, awoke with abrupt sensation of dyspnea associated with chest pain described as constant, mild-moderate heaviness that lasted a few hours and subsided without further intervention. He's been taking no medications lately as he transitions PCP, has appt scheduled on Monday.   ED Course: Markedly hypertensive with modest improvement on nitro gtt, not hypoxic but has crackles on exam and is tachypneic. CXR consistent with pulmonary edema, BNP markedly elevated >2k, troponin elevated at 0.16, stable trend, and diffuse nonspecific ST-T changes on ECG. Cardiology consulted, diuresis started, and hospitalists called to admit.   Hospital Course: Started on nitroglycerin infusion with improved BP, weaned 11/15 with initiation of coreg, lisinopril, hydralazine. He remained volume overloaded, so lasix increased with brisk diuresis on 11/16. Remains hypertensive continuing titration of coreg, hydralazine, and lisinopril.  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 BNP grossly  elevated at 2,062, typical exam and CXR findings for CHF, and blunted IVC on echo.  - Cardiology consulted. Lasix augmented to 60mg  IV BID 11/16 with good diuresis. Creatinine has definitively bumped, agree with changing over to po diuretic and monitoring. - Started BB, ACE, hydralazine  Hypertension with hypertensive urgency: Weaned off nitroglycerin gtt, started 3 drug regimen.  - Remains elevated: continue coreg, lisinopril and hydralazine. Will increase hydralazine today. - Will need ongoing close monitoring of BP and renal function/K with PCP after discharge. Had appt to establish care with Dr. Wolfgang Phoenix 11/18, rescheduled for 11/26.  Elevated troponin: Suspect demand ischemia due to HTN urgency and CHF exacerbation. Trend is flat - On ASA, statin, planning nuclear stress testing as outpatient.   T2DM uncontrolled with hyperglycemia: HbA1c is 11%, consistent with AVERAGE blood sugar of ~270mg /dl. Discussed at length with patient. I suspect insulin will be necessary if lifestyle modifications aren't sufficient, but will start with oral meds at discharge. . - Dietitian, diabetes coordinator consulted - Augmented SSI and started basal insulin. Started mealtime insulin in addition to moderate SSI and lantus 12u will augment that today.  - Hold metformin for now with climbing creatinine.  Hypokalemia: Improved with replacement.  - Monitor  Hyperlipidemia:  - Started statin, LDL 111.  AKI: 1.34 on admission, up with diuresis, unclear baseline. At high risk for CKD with HTN and DM.  - Monitor Cr with diuresis, would benefit from risk factor control  Obesity: Reported episodes of apnea.  - Strongly recommend sleep study as an outpatient.  DVT prophylaxis: Lovenox Code Status: Full Family Communication: None at bedside Disposition Plan: Home once euvolemic, anticipated DC in 24 hours.  Consultants:   Cardiology  Procedures:   None  Antimicrobials:  None  Subjective: +PND,  swelling in legs continues to improve, still some. Difficulty taking a full breath sometimes. No chest pain.     Objective: Vitals:   04/30/18 2114 05/01/18 0530 05/01/18 0637 05/01/18 0637  BP: (!) 147/84 (!) 167/107  (!) 165/100  Pulse: 81 83  81  Resp:      Temp: 98 F (36.7 C) 98.5 F (36.9 C)    TempSrc: Oral Oral    SpO2: 93% 93%  95%  Weight:   93.6 kg   Height:        Intake/Output Summary (Last 24 hours) at 05/01/2018 1405 Last data filed at 05/01/2018 1229 Gross per 24 hour  Intake 720 ml  Output 1000 ml  Net -280 ml   Filed Weights   04/29/18 0635 04/30/18 0614 05/01/18 0637  Weight: 96.8 kg 92.9 kg 93.6 kg   Gen: 62 y.o. male in no distress Pulm: Nonlabored breathing room air. Clear, diminished. CV: Regular rate and rhythm. No murmur, rub, or gallop. Mild JVD, trace dependent edema. GI: Abdomen soft, non-tender, non-distended, with normoactive bowel sounds.  Ext: Warm, no deformities Skin: No rashes, lesions or ulcers on visualized skin.  Neuro: Alert and oriented. No focal neurological deficits. Psych: Judgement and insight appear fair. Mood euthymic & affect congruent. Behavior is appropriate.    Data Reviewed: I have personally reviewed following labs and imaging studies  CBC: Recent Labs  Lab 04/26/18 0800  WBC 7.3  HGB 13.5  HCT 42.6  MCV 82.2  PLT 017   Basic Metabolic Panel: Recent Labs  Lab 04/27/18 0502 04/28/18 0440 04/29/18 0602 04/30/18 0500 05/01/18 0547  NA 140 138 136 140 139  K 2.9* 3.6 3.5 3.6 4.3  CL 106 106 101 101 102  CO2 24 25 27 30 28   GLUCOSE 215* 198* 220* 151* 191*  BUN 18 19 23 21 23   CREATININE 1.50* 1.49* 1.50* 1.54* 1.69*  CALCIUM 8.2* 8.3* 8.3* 8.6* 9.1  MG 1.7 2.0  --   --   --    GFR: Estimated Creatinine Clearance: 53.6 mL/min (A) (by C-G formula based on SCr of 1.69 mg/dL (H)). Liver Function Tests: No results for input(s): AST, ALT, ALKPHOS, BILITOT, PROT, ALBUMIN in the last 168 hours. No results  for input(s): LIPASE, AMYLASE in the last 168 hours. No results for input(s): AMMONIA in the last 168 hours. Coagulation Profile: No results for input(s): INR, PROTIME in the last 168 hours. Cardiac Enzymes: Recent Labs  Lab 04/26/18 0802 04/26/18 1011 04/26/18 1352 04/26/18 2154  TROPONINI 0.18* 0.15* 0.16* 0.15*   BNP (last 3 results) No results for input(s): PROBNP in the last 8760 hours. HbA1C: No results for input(s): HGBA1C in the last 72 hours. CBG: Recent Labs  Lab 04/30/18 1108 04/30/18 1612 04/30/18 2115 05/01/18 0734 05/01/18 1120  GLUCAP 203* 193* 261* 195* 281*   Lipid Profile: No results for input(s): CHOL, HDL, LDLCALC, TRIG, CHOLHDL, LDLDIRECT in the last 72 hours. Thyroid Function Tests: Recent Labs    04/29/18 0900  TSH 2.110   Anemia Panel: No results for input(s): VITAMINB12, FOLATE, FERRITIN, TIBC, IRON, RETICCTPCT in the last 72 hours. Urine analysis: No results found for: COLORURINE, APPEARANCEUR, [place], [place], GLUCOSEU, HGBUR, BILIRUBINUR, [place], PROTEINUR, UROBILINOGEN, NITRITE, LEUKOCYTESUR Recent Results (from the past 240 hour(s))  MRSA PCR Screening     Status: None   Collection Time: 04/26/18  6:48 PM  Result Value Ref Range Status   MRSA by PCR NEGATIVE NEGATIVE Final  Comment:        The GeneXpert MRSA Assay (FDA approved for NASAL specimens only), is one component of a comprehensive MRSA colonization surveillance program. It is not intended to diagnose MRSA infection nor to guide or monitor treatment for MRSA infections. Performed at [facility], [address], [place], [place] [address]       Radiology Studies: No results found.  Scheduled Meds: . aspirin  81 mg Oral Daily  . atorvastatin  40 mg Oral q1800  . carvedilol  12.5 mg Oral BID WC  . enoxaparin (LOVENOX) injection  40 mg Subcutaneous Q24H  . furosemide  60 mg Oral BID  . hydrALAZINE  75 mg Oral Q8H  . insulin aspart  0-15 Units Subcutaneous TID  WC  . insulin aspart  0-5 Units Subcutaneous QHS  . insulin aspart  3 Units Subcutaneous TID WC  . insulin glargine  12 Units Subcutaneous Daily  . lisinopril  10 mg Oral Daily  . sodium chloride flush  3 mL Intravenous Q12H   Continuous Infusions: . sodium chloride       LOS: 5 days   Time spent: 25 minutes.  Patrecia Pour, MD [facility] [email or web address] Password [email or web address] 05/01/2018, 2:05 PM

## 2018-05-01 NOTE — Care Management Note (Signed)
Case Management Note  Patient Details  Name: TREMELL REIMERS MRN: [id number] Date of Birth: [birth year]   If discussed at Long Length of Stay Meetings, dates discussed:  05/01/2018  Additional Comments:  Rheana Casebolt, Chauncey Reading, RN 05/01/2018, 3:28 PM

## 2018-05-01 NOTE — Progress Notes (Signed)
Inpatient Diabetes Program Recommendations  AACE/ADA: New Consensus Statement on Inpatient Glycemic Control (2019)  Target Ranges:  Prepandial:   less than 140 mg/dL      Peak postprandial:   less than 180 mg/dL (1-2 hours)      Critically ill patients:  140 - 180 mg/dL   Results for [name], [name] (MRN [id number]) as of 05/01/2018 07:19  Ref. Range 04/30/2018 07:32 04/30/2018 11:08 04/30/2018 16:12 04/30/2018 21:15  Glucose-Capillary Latest Ref Range: 70 - 99 mg/dL 164 (H) 203 (H) 193 (H) 261 (H)   Review of Glycemic Control  Diabetes history: DM2 Outpatient Diabetes medications: Metformin 1000 mg BID ( has been out of Metformin for 2 months) Current orders for Inpatient glycemic control: Lantus 12 units daily, Novolog 3 units TID with meals, Novolog 0-15 units TID with meals, Novolog 0-5 units QHS  Inpatient Diabetes Program Recommendations:  Oral Agents: May want to go ahead and start patient on oral DM medications while inpatient to determine effectiveness. Please keep in mind patient will need affordable DM medications due to lack of insurance (such as Metformin and Amaryl).  If oral DM medications are NOT started while inpatient, please consider increasing meal coverage to Novolog 6 units TID with meals.  Thanks, Barnie Alderman, RN, MSN, CDE Diabetes Coordinator Inpatient Diabetes Program [phone] (Team Pager from 8am to 5pm)

## 2018-05-02 DIAGNOSIS — E782 Mixed hyperlipidemia: Secondary | ICD-10-CM

## 2018-05-02 LAB — GLUCOSE, CAPILLARY
Glucose-Capillary: 199 mg/dL — ABNORMAL HIGH (ref 70–99)
Glucose-Capillary: 211 mg/dL — ABNORMAL HIGH (ref 70–99)

## 2018-05-02 LAB — BASIC METABOLIC PANEL
Anion gap: 8 (ref 5–15)
BUN: 24 mg/dL — ABNORMAL HIGH (ref 8–23)
CO2: 27 mmol/L (ref 22–32)
Calcium: 8.7 mg/dL — ABNORMAL LOW (ref 8.9–10.3)
Chloride: 103 mmol/L (ref 98–111)
Creatinine, Ser: 1.78 mg/dL — ABNORMAL HIGH (ref 0.61–1.24)
GFR calc Af Amer: 46 mL/min — ABNORMAL LOW (ref >60)
GFR calc non Af Amer: 39 mL/min — ABNORMAL LOW (ref >60)
Glucose, Bld: 179 mg/dL — ABNORMAL HIGH (ref 70–99)
Potassium: 3.7 mmol/L (ref 3.5–5.1)
Sodium: 138 mmol/L (ref 135–145)

## 2018-05-02 MED ORDER — ASPIRIN 81 MG PO CHEW: 81.0000 mg | CHEWABLE_TABLET | Freq: Every day | ORAL | 0 refills | Status: DC

## 2018-05-02 MED ORDER — FUROSEMIDE 40 MG PO TABS: 40.0000 mg | ORAL_TABLET | Freq: Two times a day (BID) | ORAL | Status: DC

## 2018-05-02 MED ORDER — GLIPIZIDE 5 MG PO TABS: 5.0000 mg | ORAL_TABLET | Freq: Two times a day (BID) | ORAL | 1 refills | Status: DC

## 2018-05-02 MED ORDER — ATORVASTATIN CALCIUM 40 MG PO TABS: 40.0000 mg | ORAL_TABLET | Freq: Every day | ORAL | 1 refills | Status: DC

## 2018-05-02 MED ORDER — CARVEDILOL 12.5 MG PO TABS: 12.5000 mg | ORAL_TABLET | Freq: Two times a day (BID) | ORAL | 1 refills | Status: DC

## 2018-05-02 MED ORDER — LISINOPRIL 10 MG PO TABS: 10.0000 mg | ORAL_TABLET | Freq: Every day | ORAL | 1 refills | Status: DC

## 2018-05-02 MED ORDER — FUROSEMIDE 40 MG PO TABS: 40.0000 mg | ORAL_TABLET | Freq: Two times a day (BID) | ORAL | 1 refills | Status: DC

## 2018-05-02 MED ORDER — SITAGLIPTIN PHOSPHATE 50 MG PO TABS: 50.0000 mg | ORAL_TABLET | Freq: Every day | ORAL | 1 refills | Status: DC

## 2018-05-02 MED ORDER — HYDRALAZINE HCL 25 MG PO TABS: 75.0000 mg | ORAL_TABLET | Freq: Three times a day (TID) | ORAL | 1 refills | Status: DC

## 2018-05-02 NOTE — Progress Notes (Addendum)
Progress Note  Patient Name: [name] Date of Encounter: 05/02/2018  Primary Cardiologist: [name], MD   Subjective   SOB has improved.   Inpatient Medications    Scheduled Meds: . aspirin  81 mg Oral Daily  . atorvastatin  40 mg Oral q1800  . carvedilol  12.5 mg Oral BID WC  . enoxaparin (LOVENOX) injection  40 mg Subcutaneous Q24H  . furosemide  60 mg Oral BID  . hydrALAZINE  75 mg Oral Q8H  . insulin aspart  0-15 Units Subcutaneous TID WC  . insulin aspart  0-5 Units Subcutaneous QHS  . insulin aspart  6 Units Subcutaneous TID WC  . insulin glargine  12 Units Subcutaneous Daily  . lisinopril  10 mg Oral Daily  . sodium chloride flush  3 mL Intravenous Q12H   Continuous Infusions: . sodium chloride     PRN Meds: sodium chloride, acetaminophen, guaiFENesin-dextromethorphan, hydrALAZINE, ondansetron (ZOFRAN) IV, oxyCODONE, sodium chloride flush   Vital Signs    Vitals:   05/01/18 2103 05/01/18 2108 05/02/18 0155 05/02/18 0515  BP: 129/82   (!) 153/89  Pulse: 83   80  Resp:    17  Temp: 98.3 F (36.8 C)   97.9 F (36.6 C)  TempSrc: Oral   Oral  SpO2: 95% 95%  96%  Weight:   90.5 kg   Height:        Intake/Output Summary (Last 24 hours) at 05/02/2018 0940 Last data filed at 05/01/2018 1738 Gross per 24 hour  Intake 840 ml  Output 950 ml  Net -110 ml   Filed Weights   05/01/18 0637 05/01/18 1747 05/02/18 0155  Weight: 93.6 kg 93.2 kg 90.5 kg    Telemetry    NSR - Personally Reviewed  ECG    na  Physical Exam   GEN: No acute distress.   Neck: No JVD Cardiac: RRR, no murmurs, rubs, or gallops.  Respiratory: Clear to auscultation bilaterally. GI: Soft, nontender, non-distended  MS: trace bialterla edema; No deformity. Neuro:  Nonfocal  Psych: Normal affect   Labs    Chemistry Recent Labs  Lab 04/30/18 0500 05/01/18 0547 05/02/18 0424  NA 140 139 138  K 3.6 4.3 3.7  CL 101 102 103  CO2 30 28 27   GLUCOSE 151*  191* 179*  BUN 21 23 24*  CREATININE 1.54* 1.69* 1.78*  CALCIUM 8.6* 9.1 8.7*  GFRNONAA 47* 42* 39*  GFRAA 54* 49* 46*  ANIONGAP 9 9 8      Hematology Recent Labs  Lab 04/26/18 0800  WBC 7.3  RBC 5.18  HGB 13.5  HCT 42.6  MCV 82.2  MCH 26.1  MCHC 31.7  RDW 14.4  PLT 310    Cardiac Enzymes Recent Labs  Lab 04/26/18 0802 04/26/18 1011 04/26/18 1352 04/26/18 2154  TROPONINI 0.18* 0.15* 0.16* 0.15*    Recent Labs  Lab 04/26/18 0804  TROPIPOC 0.16*     BNP Recent Labs  Lab 04/26/18 0802  BNP 2,062.0*     DDimer No results for input(s): DDIMER in the last 168 hours.   Radiology    No results found.  Cardiac Studies     Patient Profile     62 y.o.malewith a hx of hypertension and type 2 diabetes mellituswho is being seen today for the evaluation of CHFat the request of Dr. Thurnell Garbe.  Assessment & Plan    1. Acute combined systolic/diastolic HF - 04/5725 echo LVE 45-50%, abnormal diastolic function indeterminant grade,  mild MR, dilated IVC - started on coreg 12.5 g bid, lisinopril 10mg  daily, hydralaine 75mg  tid this admission - plans for outpatient stress test per Dr Bronson Ing. Trops 0.15 to 0.18 in setting of HTN emergency and CHF, CKD. Overall not specific for ACS  - I/Os incomplete this admission. Negative 9.3 L this admit from available data. Uptrend in Cr, yesterday we changed to oral lasix. Got 60mg  po bid yesterday, continued uptrend in Cr, change to 40mg  bid.    2. Hypertensive emergency - presented with severe HTN SBP neear 200, pulm edema. Initially on NG drip, now off - bp's improving, several changes made over the last few days, would hold steady on current doses at this time.   3. CKD III - admit Cr 1.3, baseline available is from 8 years ago. Suspected CKD III -  uptrend in Cr with diuresis. Changed to oral lasix. Madaline Brilliant for discharge today. We will arrange follow up. Would ask for BMET and Mg by his pcp at there  appointment next week. We will arrange f/u in our clinic in 3 weeks.       For questions or updates, please contact [place] Please consult [email or web address] for contact info under        Signed, Carlyle Dolly, MD  05/02/2018, 9:40 AM

## 2018-05-02 NOTE — Discharge Summary (Signed)
Physician Discharge Summary  [name] WPY:[id number] DOB: [birth year] DOA: 04/26/2018  PCP: [name], MD  Admit date: 04/26/2018 Discharge date: 05/02/2018  Admitted From: Home Disposition:  Home   Recommendations for Outpatient Follow-up:  1. Follow up with PCP in 1-2 weeks 2. Please obtain BMP/CBC in one week   Discharge Condition: Stable CODE STATUS:FULL Diet recommendation: Heart Healthy / Carb Modified   Brief/Interim Summary: 62 y.o.malewith a history ofdiabetes and HTN who presented to the ED with dyspnea and associated chest discomfort. He's had a couple weeks of feet swelling progressing to the leg and most recently to the abdomen and began requiring elevation of his head to sleep. He describes orthopnea for 2 weeks which has been associated with nonproductive cough and last night he had PND, awoke with abrupt sensation of dyspnea associated with chest pain described as constant, mild-moderate heaviness that lasted a few hours and subsided without further intervention. He's been taking no medications lately as he transitions PCP, has appt scheduled on Monday.  ED Course:Markedly hypertensive with modest improvement on nitro gtt, not hypoxic but has crackles on exam and is tachypneic. CXR consistent with pulmonary edema, BNP markedly elevated >2k, troponin elevated at 0.16, stable trend, and diffuse nonspecific ST-T changes on ECG. Cardiology consulted, diuresis started, and hospitalists called to admit.  Hospital Course: Started on nitroglycerin infusion with improved BP, weaned 11/15 with initiation of coreg, lisinopril, hydralazine. He remained volume overloaded, so lasix increased with brisk diuresis on 11/16. Remains hypertensive continuing titration of coreg, hydralazine, and lisinopril.  Discharge Diagnoses:  Principal Problem:   Acute combined systolic (congestive) and diastolic (congestive) heart failure (HCC) Active Problems:   Hypertension  Diabetes mellitus without complication (HCC)   Hypertensive urgency   Hyperlipidemia   Elevated troponin  Acute combined systolic and diastolic CHF: New Dx with echo showing EF 45-50%, diffuse hypokinesis without focal WMA, indeterminate diastolic dysfunction, severely dilated LA, mild MR, mildly dilated RA. BNP grossly elevated at 2,062, typical exam and CXR findings for CHF, and blunted IVC on echo.  - Cardiology consulted. Lasix augmented to 60mg  IV BID 11/16 with good diuresis. Creatinine has definitively bumped, agree with changing over to po diuretic and monitoring. - Started BB, ACE, hydralazine  Hypertension with hypertensive urgency: Weaned off nitroglycerin gtt, started 3 drug regimen.  - Remains elevated: continue coreg, lisinopril and hydralazine. Will increase hydralazine today. - Will need ongoing close monitoring of BP and renal function/K with PCP after discharge. Had appt to establish care with Dr. Wolfgang Phoenix 11/18, rescheduled for 11/26.  Elevated troponin: Suspect demand ischemia due to HTN urgency and CHF exacerbation. Trend is flat - On ASA, statin, planning nuclear stress testing as outpatient.   T2DM uncontrolled with hyperglycemia: HbA1c is 11%, consistent with AVERAGE blood sugar of ~270mg /dl. Discussed at length with patient. I suspect insulin will be necessary if lifestyle modifications aren't sufficient, but will start with oral meds at discharge. . - Dietitian, diabetes coordinator consulted - Augmented SSI and started basal insulin. Started mealtime insulin in addition to moderate SSI and lantus 12u will augment that today.  - Hold metformin for now with climbing creatinine. -d/c home with glipizide and sitagliptin  Hypokalemia: Improved with replacement.  - Monitor  Hyperlipidemia:  - Started statin, LDL 111.  AKI: 1.34 on admission, up with diuresis, unclear baseline. At high risk for CKD with HTN and DM.  - Monitor Cr with diuresis, would benefit from  risk factor control  Obesity: Reported episodes of  apnea.  - Strongly recommend sleep study as an outpatient.  Discharge Instructions   Allergies as of 05/02/2018      Reactions   Bee Venom Anaphylaxis      Medication List    STOP taking these medications   hydrochlorothiazide 25 MG tablet Commonly known as:  HYDRODIURIL   metFORMIN 1000 MG tablet Commonly known as:  GLUCOPHAGE   metoprolol tartrate 25 MG tablet Commonly known as:  LOPRESSOR     TAKE these medications   aspirin 81 MG chewable tablet Chew 1 tablet (81 mg total) by mouth daily. Start taking on:  05/03/2018   atorvastatin 40 MG tablet Commonly known as:  LIPITOR Take 1 tablet (40 mg total) by mouth daily at 6 PM.   carvedilol 12.5 MG tablet Commonly known as:  COREG Take 1 tablet (12.5 mg total) by mouth 2 (two) times daily with a meal.   furosemide 40 MG tablet Commonly known as:  LASIX Take 1 tablet (40 mg total) by mouth 2 (two) times daily.   glipiZIDE 5 MG tablet Commonly known as:  GLUCOTROL Take 1 tablet (5 mg total) by mouth 2 (two) times daily before a meal.   hydrALAZINE 25 MG tablet Commonly known as:  APRESOLINE Take 3 tablets (75 mg total) by mouth every 8 (eight) hours.   lisinopril 10 MG tablet Commonly known as:  PRINIVIL,ZESTRIL Take 1 tablet (10 mg total) by mouth daily. Start taking on:  05/03/2018   sitaGLIPtin 50 MG tablet Commonly known as:  JANUVIA Take 1 tablet (50 mg total) by mouth daily.      Follow-up Information    [name], MD Follow up.   Specialty:  Family Medicine Why:  appointment rescheduled for November 26th at 1 pm Contact information: [address] [place] [address] [phone]        Herminio Commons, MD Follow up on 06/08/2018.   Specialty:  Cardiology Why:  Cardiology Hospital Follow-Up on 06/08/2018 at 3:00 PM with Kerin Ransom, PA-C (works with [name]).  Contact information: [place] [id number] [phone]          Allergies  Allergen Reactions  . Bee Venom Anaphylaxis    Consultations:  cardiology   Procedures/Studies: Dg Chest 2 View  Result Date: 04/26/2018 CLINICAL DATA:  Shortness of breath and chest pain beginning yesterday EXAM: CHEST - 2 VIEW COMPARISON:  09/02/2011 FINDINGS: Enlarged cardiac silhouette that could be due to cardiomegaly and/or pericardial fluid. Pulmonary venous hypertension with early interstitial edema. No pleural fluid accumulation at this time. No acute or significant bone finding. IMPRESSION: Acute congestive heart failure. Enlarged cardiac silhouette that could be due to cardiomegaly and/or pericardial fluid. Pulmonary venous hypertension with early interstitial edema. Electronically Signed   By: Nelson Chimes M.D.   On: 04/26/2018 08:48         Discharge Exam: Vitals:   05/01/18 2108 05/02/18 0515  BP:  (!) 153/89  Pulse:  80  Resp:  17  Temp:  97.9 F (36.6 C)  SpO2: 95% 96%   Vitals:   05/01/18 2103 05/01/18 2108 05/02/18 0155 05/02/18 0515  BP: 129/82   (!) 153/89  Pulse: 83   80  Resp:    17  Temp: 98.3 F (36.8 C)   97.9 F (36.6 C)  TempSrc: Oral   Oral  SpO2: 95% 95%  96%  Weight:   90.5 kg   Height:  General: Pt is alert, awake, not in acute distress Cardiovascular: RRR, S1/S2 +, no rubs, no gallops Respiratory: CTA bilaterally, no wheezing, no rhonchi Abdominal: Soft, NT, ND, bowel sounds + Extremities: no edema, no cyanosis   The results of significant diagnostics from this hospitalization (including imaging, microbiology, ancillary and laboratory) are listed below for reference.    Significant Diagnostic Studies: Dg Chest 2 View  Result Date: 04/26/2018 CLINICAL DATA:  Shortness of breath and chest pain beginning yesterday EXAM: CHEST - 2 VIEW COMPARISON:  09/02/2011 FINDINGS: Enlarged cardiac silhouette that could be due to cardiomegaly and/or pericardial fluid. Pulmonary venous  hypertension with early interstitial edema. No pleural fluid accumulation at this time. No acute or significant bone finding. IMPRESSION: Acute congestive heart failure. Enlarged cardiac silhouette that could be due to cardiomegaly and/or pericardial fluid. Pulmonary venous hypertension with early interstitial edema. Electronically Signed   By: Nelson Chimes M.D.   On: 04/26/2018 08:48     Microbiology: Recent Results (from the past 240 hour(s))  MRSA PCR Screening     Status: None   Collection Time: 04/26/18  6:48 PM  Result Value Ref Range Status   MRSA by PCR NEGATIVE NEGATIVE Final    Comment:        The GeneXpert MRSA Assay (FDA approved for NASAL specimens only), is one component of a comprehensive MRSA colonization surveillance program. It is not intended to diagnose MRSA infection nor to guide or monitor treatment for MRSA infections. Performed at [facility], [address], [place], [place] [address]      Labs: Basic Metabolic Panel: Recent Labs  Lab 04/27/18 0502 04/28/18 0440 04/29/18 0602 04/30/18 0500 05/01/18 0547 05/02/18 0424  NA 140 138 136 140 139 138  K 2.9* 3.6 3.5 3.6 4.3 3.7  CL 106 106 101 101 102 103  CO2 24 25 27 30 28 27   GLUCOSE 215* 198* 220* 151* 191* 179*  BUN 18 19 23 21 23  24*  CREATININE 1.50* 1.49* 1.50* 1.54* 1.69* 1.78*  CALCIUM 8.2* 8.3* 8.3* 8.6* 9.1 8.7*  MG 1.7 2.0  --   --   --   --    Liver Function Tests: No results for input(s): AST, ALT, ALKPHOS, BILITOT, PROT, ALBUMIN in the last 168 hours. No results for input(s): LIPASE, AMYLASE in the last 168 hours. No results for input(s): AMMONIA in the last 168 hours. CBC: Recent Labs  Lab 04/26/18 0800  WBC 7.3  HGB 13.5  HCT 42.6  MCV 82.2  PLT 310   Cardiac Enzymes: Recent Labs  Lab 04/26/18 0802 04/26/18 1011 04/26/18 1352 04/26/18 2154  TROPONINI 0.18* 0.15* 0.16* 0.15*   BNP: Invalid input(s): POCBNP CBG: Recent Labs  Lab 05/01/18 0734 05/01/18 1120  05/01/18 1614 05/01/18 2110 05/02/18 0754  GLUCAP 195* 281* 151* 260* 199*    Time coordinating discharge:  36 minutes  Signed:  Orson Eva, DO [facility] Pager: [phone] 05/02/2018, 10:59 AM

## 2018-05-04 ENCOUNTER — Telehealth: Payer: Self-pay | Admitting: Family Medicine

## 2018-05-04 NOTE — Telephone Encounter (Signed)
Nurse's-patient recently discharged from the hospital. Please call patient, let them know that we are aware that they were discharged from the hospital. Please schedule them to follow-up with [place] within the next 7 days. Advised the patient to bring all of their medications with him to the visit. Please inquire if they are having any acute issues currently and documented accordingly.  Nurses-pt has appt on Tues  Have pt bring all med bottles with him

## 2018-05-04 NOTE — Telephone Encounter (Signed)
Patient has appointment 05/08/18 with Dr Nicki Reaper and will bring all his medications with him to the appointment.

## 2018-05-04 NOTE — Telephone Encounter (Signed)
Left message to return call 

## 2018-05-08 ENCOUNTER — Ambulatory Visit (INDEPENDENT_AMBULATORY_CARE_PROVIDER_SITE_OTHER): Payer: Medicaid Other | Admitting: Family Medicine

## 2018-05-08 ENCOUNTER — Encounter: Payer: Self-pay | Admitting: Family Medicine

## 2018-05-08 VITALS — BP 148/90 | Ht 71.0 in | Wt 203.6 lb

## 2018-05-08 DIAGNOSIS — E119 Type 2 diabetes mellitus without complications: Secondary | ICD-10-CM

## 2018-05-08 DIAGNOSIS — E782 Mixed hyperlipidemia: Secondary | ICD-10-CM

## 2018-05-08 DIAGNOSIS — I10 Essential (primary) hypertension: Secondary | ICD-10-CM | POA: Diagnosis not present

## 2018-05-08 DIAGNOSIS — N289 Disorder of kidney and ureter, unspecified: Secondary | ICD-10-CM

## 2018-05-08 NOTE — Progress Notes (Signed)
   Subjective:    Patient ID: [name], male    DOB: [birth year], 62 y.o.   MRN: [id number]  HPI Pt here today for hospital follow up. Pt was in 04/26/18-05/02/18. Pt states that provider in hospital wanted him to be on Januvia but it is $500. Diabetic nurse suggested to stay on Metformin and another agent. Pt did see Dr.Bradford before Patient comes to get established Long discussion held with the patient regarding his diabetes blood pressure congestive heart failure and the importance of a healthy diet, checking his sugars regularly, weighing himself, he is not a candidate for metformin Could not afford Januvia States he will work harder on diet He is taking his glipizide He will send [place] glucose readings in a couple weeks time He will see cardiology as planned Echo x-rays lab work reviewed with patient  Review of Systems  Constitutional: Negative for diaphoresis and fatigue.  HENT: Negative for congestion and rhinorrhea.   Respiratory: Negative for cough and shortness of breath.   Cardiovascular: Negative for chest pain and leg swelling.  Gastrointestinal: Negative for abdominal pain and diarrhea.  Skin: Negative for color change and rash.  Neurological: Negative for dizziness and headaches.  Psychiatric/Behavioral: Negative for behavioral problems and confusion.       Objective:   Physical Exam  Constitutional: He appears well-nourished. No distress.  HENT:  Head: Normocephalic and atraumatic.  Eyes: Right eye exhibits no discharge. Left eye exhibits no discharge.  Neck: No tracheal deviation present.  Cardiovascular: Normal rate, regular rhythm and normal heart sounds.  No murmur heard. Pulmonary/Chest: Effort normal and breath sounds normal. No respiratory distress.  Musculoskeletal: He exhibits no edema.  Lymphadenopathy:    He has no cervical adenopathy.  Neurological: He is alert. Coordination normal.  Skin: Skin is warm and dry.  Psychiatric: He has a normal  mood and affect. His behavior is normal.  Vitals reviewed. Lungs without crackles legs no edema        Assessment & Plan:  Diabetes subpar control he will clean up his diet he will exercise more often within reason he will also take his glipizide he will send [place] some glucose readings we may need to adjust that medication  Congestive heart failure blood pressure under good control today he will continue medication he will follow through with cardiology I did advise him to weigh himself on a regular basis if his weights go up significantly more than 4 or 5 pounds within a 3-day span he is to notify us  Blood pressure good control currently low-salt diet recommended  Patient encouraged to follow-up with [place] again in 3 months time follow-up sooner if any problems or congestive heart failure occurs  Renal insufficiency he will check a metabolic 7 somewhere within the next week

## 2018-05-08 NOTE — Patient Instructions (Addendum)
DASH Eating Plan DASH stands for "Dietary Approaches to Stop Hypertension." The DASH eating plan is a healthy eating plan that has been shown to reduce high blood pressure (hypertension). It may also reduce your risk for type 2 diabetes, heart disease, and stroke. The DASH eating plan may also help with weight loss. What are tips for following this plan? General guidelines  Avoid eating more than 2,300 mg (milligrams) of salt (sodium) a day. If you have hypertension, you may need to reduce your sodium intake to 1,500 mg a day.  Limit alcohol intake to no more than 1 drink a day for nonpregnant women and 2 drinks a day for men. One drink equals 12 oz of beer, 5 oz of wine, or 1 oz of hard liquor.  Work with your health care provider to maintain a healthy body weight or to lose weight. Ask what an ideal weight is for you.  Get at least 30 minutes of exercise that causes your heart to beat faster (aerobic exercise) most days of the week. Activities may include walking, swimming, or biking.  Work with your health care provider or diet and nutrition specialist (dietitian) to adjust your eating plan to your individual calorie needs. Reading food labels  Check food labels for the amount of sodium per serving. Choose foods with less than 5 percent of the Daily Value of sodium. Generally, foods with less than 300 mg of sodium per serving fit into this eating plan.  To find whole grains, look for the word "whole" as the first word in the ingredient list. Shopping  Buy products labeled as "low-sodium" or "no salt added."  Buy fresh foods. Avoid canned foods and premade or frozen meals. Cooking  Avoid adding salt when cooking. Use salt-free seasonings or herbs instead of table salt or sea salt. Check with your health care provider or pharmacist before using salt substitutes.  Do not fry foods. Cook foods using healthy methods such as baking, boiling, grilling, and broiling instead.  Cook with  heart-healthy oils, such as olive, canola, soybean, or sunflower oil. Meal planning   Eat a balanced diet that includes: ? 5 or more servings of fruits and vegetables each day. At each meal, try to fill half of your plate with fruits and vegetables. ? Up to 6-8 servings of whole grains each day. ? Less than 6 oz of lean meat, poultry, or fish each day. A 3-oz serving of meat is about the same size as a deck of cards. One egg equals 1 oz. ? 2 servings of low-fat dairy each day. ? A serving of nuts, seeds, or beans 5 times each week. ? Heart-healthy fats. Healthy fats called Omega-3 fatty acids are found in foods such as flaxseeds and coldwater fish, like sardines, salmon, and mackerel.  Limit how much you eat of the following: ? Canned or prepackaged foods. ? Food that is high in trans fat, such as fried foods. ? Food that is high in saturated fat, such as fatty meat. ? Sweets, desserts, sugary drinks, and other foods with added sugar. ? Full-fat dairy products.  Do not salt foods before eating.  Try to eat at least 2 vegetarian meals each week.  Eat more home-cooked food and less restaurant, buffet, and fast food.  When eating at a restaurant, ask that your food be prepared with less salt or no salt, if possible. What foods are recommended? The items listed may not be a complete list. Talk with your dietitian about what   dietary choices are best for you. Grains Whole-grain or whole-wheat bread. Whole-grain or whole-wheat pasta. Brown rice. Oatmeal. Quinoa. Bulgur. Whole-grain and low-sodium cereals. Pita bread. Low-fat, low-sodium crackers. Whole-wheat flour tortillas. Vegetables Fresh or frozen vegetables (raw, steamed, roasted, or grilled). Low-sodium or reduced-sodium tomato and vegetable juice. Low-sodium or reduced-sodium tomato sauce and tomato paste. Low-sodium or reduced-sodium canned vegetables. Fruits All fresh, dried, or frozen fruit. Canned fruit in natural juice (without  added sugar). Meat and other protein foods Skinless chicken or turkey. Ground chicken or turkey. Pork with fat trimmed off. Fish and seafood. Egg whites. Dried beans, peas, or lentils. Unsalted nuts, nut butters, and seeds. Unsalted canned beans. Lean cuts of beef with fat trimmed off. Low-sodium, lean deli meat. Dairy Low-fat (1%) or fat-free (skim) milk. Fat-free, low-fat, or reduced-fat cheeses. Nonfat, low-sodium ricotta or cottage cheese. Low-fat or nonfat yogurt. Low-fat, low-sodium cheese. Fats and oils Soft margarine without trans fats. Vegetable oil. Low-fat, reduced-fat, or light mayonnaise and salad dressings (reduced-sodium). Canola, safflower, olive, soybean, and sunflower oils. Avocado. Seasoning and other foods Herbs. Spices. Seasoning mixes without salt. Unsalted popcorn and pretzels. Fat-free sweets. What foods are not recommended? The items listed may not be a complete list. Talk with your dietitian about what dietary choices are best for you. Grains Baked goods made with fat, such as croissants, muffins, or some breads. Dry pasta or rice meal packs. Vegetables Creamed or fried vegetables. Vegetables in a cheese sauce. Regular canned vegetables (not low-sodium or reduced-sodium). Regular canned tomato sauce and paste (not low-sodium or reduced-sodium). Regular tomato and vegetable juice (not low-sodium or reduced-sodium). Pickles. Olives. Fruits Canned fruit in a light or heavy syrup. Fried fruit. Fruit in cream or butter sauce. Meat and other protein foods Fatty cuts of meat. Ribs. Fried meat. Bacon. Sausage. Bologna and other processed lunch meats. Salami. Fatback. Hotdogs. Bratwurst. Salted nuts and seeds. Canned beans with added salt. Canned or smoked fish. Whole eggs or egg yolks. Chicken or turkey with skin. Dairy Whole or 2% milk, cream, and half-and-half. Whole or full-fat cream cheese. Whole-fat or sweetened yogurt. Full-fat cheese. Nondairy creamers. Whipped toppings.  Processed cheese and cheese spreads. Fats and oils Butter. Stick margarine. Lard. Shortening. Ghee. Bacon fat. Tropical oils, such as coconut, palm kernel, or palm oil. Seasoning and other foods Salted popcorn and pretzels. Onion salt, garlic salt, seasoned salt, table salt, and sea salt. Worcestershire sauce. Tartar sauce. Barbecue sauce. Teriyaki sauce. Soy sauce, including reduced-sodium. Steak sauce. Canned and packaged gravies. Fish sauce. Oyster sauce. Cocktail sauce. Horseradish that you find on the shelf. Ketchup. Mustard. Meat flavorings and tenderizers. Bouillon cubes. Hot sauce and Tabasco sauce. Premade or packaged marinades. Premade or packaged taco seasonings. Relishes. Regular salad dressings. Where to find more information:  National Heart, Lung, and Blood Institute: www.nhlbi.nih.gov  American Heart Association: www.heart.org Summary  The DASH eating plan is a healthy eating plan that has been shown to reduce high blood pressure (hypertension). It may also reduce your risk for type 2 diabetes, heart disease, and stroke.  With the DASH eating plan, you should limit salt (sodium) intake to 2,300 mg a day. If you have hypertension, you may need to reduce your sodium intake to 1,500 mg a day.  When on the DASH eating plan, aim to eat more fresh fruits and vegetables, whole grains, lean proteins, low-fat dairy, and heart-healthy fats.  Work with your health care provider or diet and nutrition specialist (dietitian) to adjust your eating plan to your individual   calorie needs. This information is not intended to replace advice given to you by your health care provider. Make sure you discuss any questions you have with your health care provider. Document Released: 05/19/2011 Document Revised: 05/23/2016 Document Reviewed: 05/23/2016 Elsevier Interactive Patient Education  2018 [employer].  Diabetes Mellitus and Nutrition When you have diabetes (diabetes mellitus), it is very  important to have healthy eating habits because your blood sugar (glucose) levels are greatly affected by what you eat and drink. Eating healthy foods in the appropriate amounts, at about the same times every day, can help you:  Control your blood glucose.  Lower your risk of heart disease.  Improve your blood pressure.  Reach or maintain a healthy weight.  Every person with diabetes is different, and each person has different needs for a meal plan. Your health care provider may recommend that you work with a diet and nutrition specialist (dietitian) to make a meal plan that is best for you. Your meal plan may vary depending on factors such as:  The calories you need.  The medicines you take.  Your weight.  Your blood glucose, blood pressure, and cholesterol levels.  Your activity level.  Other health conditions you have, such as heart or kidney disease.  How do carbohydrates affect me? Carbohydrates affect your blood glucose level more than any other type of food. Eating carbohydrates naturally increases the amount of glucose in your blood. Carbohydrate counting is a method for keeping track of how many carbohydrates you eat. Counting carbohydrates is important to keep your blood glucose at a healthy level, especially if you use insulin or take certain oral diabetes medicines. It is important to know how many carbohydrates you can safely have in each meal. This is different for every person. Your dietitian can help you calculate how many carbohydrates you should have at each meal and for snack. Foods that contain carbohydrates include:  Bread, cereal, rice, pasta, and crackers.  Potatoes and corn.  Peas, beans, and lentils.  Milk and yogurt.  Fruit and juice.  Desserts, such as cakes, cookies, ice cream, and candy.  How does alcohol affect me? Alcohol can cause a sudden decrease in blood glucose (hypoglycemia), especially if you use insulin or take certain oral diabetes  medicines. Hypoglycemia can be a life-threatening condition. Symptoms of hypoglycemia (sleepiness, dizziness, and confusion) are similar to symptoms of having too much alcohol. If your health care provider says that alcohol is safe for you, follow these guidelines:  Limit alcohol intake to no more than 1 drink per day for nonpregnant women and 2 drinks per day for men. One drink equals 12 oz of beer, 5 oz of wine, or 1 oz of hard liquor.  Do not drink on an empty stomach.  Keep yourself hydrated with water, diet soda, or unsweetened iced tea.  Keep in mind that regular soda, juice, and other mixers may contain a lot of sugar and must be counted as carbohydrates.  What are tips for following this plan? Reading food labels  Start by checking the serving size on the label. The amount of calories, carbohydrates, fats, and other nutrients listed on the label are based on one serving of the food. Many foods contain more than one serving per package.  Check the total grams (g) of carbohydrates in one serving. You can calculate the number of servings of carbohydrates in one serving by dividing the total carbohydrates by 15. For example, if a food has 30 g of total  carbohydrates, it would be equal to 2 servings of carbohydrates.  Check the number of grams (g) of saturated and trans fats in one serving. Choose foods that have low or no amount of these fats.  Check the number of milligrams (mg) of sodium in one serving. Most people should limit total sodium intake to less than 2,300 mg per day.  Always check the nutrition information of foods labeled as "low-fat" or "nonfat". These foods may be higher in added sugar or refined carbohydrates and should be avoided.  Talk to your dietitian to identify your daily goals for nutrients listed on the label. Shopping  Avoid buying canned, premade, or processed foods. These foods tend to be high in fat, sodium, and added sugar.  Shop around the outside edge  of the grocery store. This includes fresh fruits and vegetables, bulk grains, fresh meats, and fresh dairy. Cooking  Use low-heat cooking methods, such as baking, instead of high-heat cooking methods like deep frying.  Cook using healthy oils, such as olive, canola, or sunflower oil.  Avoid cooking with butter, cream, or high-fat meats. Meal planning  Eat meals and snacks regularly, preferably at the same times every day. Avoid going long periods of time without eating.  Eat foods high in fiber, such as fresh fruits, vegetables, beans, and whole grains. Talk to your dietitian about how many servings of carbohydrates you can eat at each meal.  Eat 4-6 ounces of lean protein each day, such as lean meat, chicken, fish, eggs, or tofu. 1 ounce is equal to 1 ounce of meat, chicken, or fish, 1 egg, or 1/4 cup of tofu.  Eat some foods each day that contain healthy fats, such as avocado, nuts, seeds, and fish. Lifestyle   Check your blood glucose regularly.  Exercise at least 30 minutes 5 or more days each week, or as told by your health care provider.  Take medicines as told by your health care provider.  Do not use any products that contain nicotine or tobacco, such as cigarettes and e-cigarettes. If you need help quitting, ask your health care provider.  Work with a Social worker or diabetes educator to identify strategies to manage stress and any emotional and social challenges. What are some questions to ask my health care provider?  Do I need to meet with a diabetes educator?  Do I need to meet with a dietitian?  What number can I call if I have questions?  When are the best times to check my blood glucose? Where to find more information:  American Diabetes Association: diabetes.org/food-and-fitness/food  Academy of Nutrition and Dietetics: PokerClues.dk  [employer] of Diabetes and Digestive and Kidney Diseases (NIH):  ContactWire.be Summary  A healthy meal plan will help you control your blood glucose and maintain a healthy lifestyle.  Working with a diet and nutrition specialist (dietitian) can help you make a meal plan that is best for you.  Keep in mind that carbohydrates and alcohol have immediate effects on your blood glucose levels. It is important to count carbohydrates and to use alcohol carefully. This information is not intended to replace advice given to you by your health care provider. Make sure you discuss any questions you have with your health care provider. Document Released: 02/24/2005 Document Revised: 07/04/2016 Document Reviewed: 07/04/2016 Elsevier Interactive Patient Education  [employer].

## 2018-05-09 LAB — BASIC METABOLIC PANEL
BUN/Creatinine Ratio: 17 (ref 10–24)
BUN: 27 mg/dL (ref 8–27)
CO2: 22 mmol/L (ref 20–29)
Calcium: 10.2 mg/dL (ref 8.6–10.2)
Chloride: 101 mmol/L (ref 96–106)
Creatinine, Ser: 1.63 mg/dL — ABNORMAL HIGH (ref 0.76–1.27)
GFR calc Af Amer: 52 mL/min/{1.73_m2} — ABNORMAL LOW (ref >59)
GFR calc non Af Amer: 45 mL/min/{1.73_m2} — ABNORMAL LOW (ref >59)
Glucose: 225 mg/dL — ABNORMAL HIGH (ref 65–99)
Potassium: 4.3 mmol/L (ref 3.5–5.2)
Sodium: 141 mmol/L (ref 134–144)

## 2018-05-31 ENCOUNTER — Telehealth: Payer: Self-pay | Admitting: Family Medicine

## 2018-05-31 NOTE — Telephone Encounter (Signed)
Pt brought by blood sugar glucose readings in white binder, binder placed in Dr.Scott's box.

## 2018-06-03 NOTE — Telephone Encounter (Signed)
Please talk to the patient let him know I looked over the log. I recommend that he increase the glipizide Current dose 5 mg twice a day I recommend a 5 mg tablet take 2 in the morning 2 in the evening We will need to send in a prescription for 120 tablets with 5 refills The patient may pick up his logbook and send [place] more readings in 2 to 3 weeks time. The next time he brings the log book he can ask the front just to make copies of the most recent sheets and they will do so Thanks

## 2018-06-04 NOTE — Telephone Encounter (Signed)
Left message to return call 

## 2018-06-07 NOTE — Telephone Encounter (Signed)
Left message to return call 

## 2018-06-08 ENCOUNTER — Ambulatory Visit (INDEPENDENT_AMBULATORY_CARE_PROVIDER_SITE_OTHER): Payer: Medicaid Other | Admitting: Cardiology

## 2018-06-08 ENCOUNTER — Ambulatory Visit: Payer: Self-pay | Admitting: Cardiology

## 2018-06-08 ENCOUNTER — Encounter: Payer: Self-pay | Admitting: Cardiology

## 2018-06-08 VITALS — BP 162/86 | HR 84 | Ht 71.0 in | Wt 213.0 lb

## 2018-06-08 DIAGNOSIS — I429 Cardiomyopathy, unspecified: Secondary | ICD-10-CM

## 2018-06-08 DIAGNOSIS — N183 Chronic kidney disease, stage 3 unspecified: Secondary | ICD-10-CM

## 2018-06-08 DIAGNOSIS — R0609 Other forms of dyspnea: Secondary | ICD-10-CM | POA: Diagnosis not present

## 2018-06-08 DIAGNOSIS — E1122 Type 2 diabetes mellitus with diabetic chronic kidney disease: Secondary | ICD-10-CM

## 2018-06-08 DIAGNOSIS — R7989 Other specified abnormal findings of blood chemistry: Secondary | ICD-10-CM | POA: Diagnosis not present

## 2018-06-08 DIAGNOSIS — R0600 Dyspnea, unspecified: Secondary | ICD-10-CM

## 2018-06-08 DIAGNOSIS — D631 Anemia in chronic kidney disease: Secondary | ICD-10-CM | POA: Insufficient documentation

## 2018-06-08 DIAGNOSIS — I10 Essential (primary) hypertension: Secondary | ICD-10-CM | POA: Diagnosis not present

## 2018-06-08 DIAGNOSIS — I5041 Acute combined systolic (congestive) and diastolic (congestive) heart failure: Secondary | ICD-10-CM | POA: Diagnosis not present

## 2018-06-08 DIAGNOSIS — R778 Other specified abnormalities of plasma proteins: Secondary | ICD-10-CM

## 2018-06-08 NOTE — Assessment & Plan Note (Signed)
Poor control on admission Nov 2019 Elevated in the office today- 148/90 by me

## 2018-06-08 NOTE — Assessment & Plan Note (Signed)
EF 45% by echo Nov 2019- r/o CAD- Myoview ordered

## 2018-06-08 NOTE — Assessment & Plan Note (Signed)
Poorly controlled NIDDM with CRI PCP following

## 2018-06-08 NOTE — Assessment & Plan Note (Signed)
Most recent SCr 1.6

## 2018-06-08 NOTE — Assessment & Plan Note (Signed)
Admitted 11/14-11/20/19-diuresed 10L

## 2018-06-08 NOTE — Progress Notes (Signed)
06/08/2018 Reita Cliche Rozenberg   08-19-1955  [id number]  Primary Physician [name], MD Primary Cardiologist: Dr Bronson Ing  HPI: Patient is a pleasant 62 year old male who was seen today as a post hospital follow-up.  His father was a physician here in [place].  The patient has a history of hypertension and non-insulin-dependent diabetes.  He had drifted off his medications while he was between primary care providers.  He presented to [facility] April 26, 2018 with uncontrolled hypertension and congestive heart failure.  He did have a bump in his troponin but it was flat trend.  It was felt this was probably related to uncontrolled hypertension, acute combined systolic and diastolic heart failure, and chronic renal insufficiency.  The patient diuresed almost 10 L.  His medications were adjusted and he was discharged on November 20.  He has followed up with his primary care provider who is adjusting his diabetic medications.  His serum creatinine at discharge was 1.78, a follow-up serum creatinine done on November 26 was 1.63 with a potassium of 4.3.  Low-dose ACE inhibitor was added at discharge.  Since discharge he done well.  He denies any orthopnea.  He still has some dyspnea on exertion and trace lower extremity edema.  He is watching his sodium intake and taking his medications although he admits he thinks some of his medications are making him feel "rundown".  He is checking his blood pressure at home, he is keeping a log but did not bring it with him today.  Dr. Harl Bowie wanted the patient to have a outpatient Myoview and this will be arranged.   Current Outpatient Medications  Medication Sig Dispense Refill  . aspirin 81 MG chewable tablet Chew 1 tablet (81 mg total) by mouth daily. 30 tablet 0  . atorvastatin (LIPITOR) 40 MG tablet Take 1 tablet (40 mg total) by mouth daily at 6 PM. 30 tablet 1  . carvedilol (COREG) 12.5 MG tablet Take 1 tablet (12.5 mg total) by mouth  2 (two) times daily with a meal. 50 tablet 1  . furosemide (LASIX) 40 MG tablet Take 1 tablet (40 mg total) by mouth 2 (two) times daily. 50 tablet 1  . glipiZIDE (GLUCOTROL) 5 MG tablet Take 1 tablet (5 mg total) by mouth 2 (two) times daily before a meal. 60 tablet 1  . hydrALAZINE (APRESOLINE) 25 MG tablet Take 3 tablets (75 mg total) by mouth every 8 (eight) hours. 90 tablet 1  . lisinopril (PRINIVIL,ZESTRIL) 10 MG tablet Take 1 tablet (10 mg total) by mouth daily. 30 tablet 1   No current facility-administered medications for this visit.     Allergies  Allergen Reactions  . Bee Venom Anaphylaxis    Past Medical History:  Diagnosis Date  . Diabetes mellitus without complication ([place])   . Hypertension     Social History   Socioeconomic History  . Marital status: Single    Spouse name: Not on file  . Number of children: Not on file  . Years of education: Not on file  . Highest education level: Not on file  Occupational History  . Not on file  Social Needs  . Financial resource strain: Not on file  . Food insecurity:    Worry: Not on file    Inability: Not on file  . Transportation needs:    Medical: Not on file    Non-medical: Not on file  Tobacco Use  . Smoking status: Former Research scientist (life sciences)  . Smokeless  tobacco: Never Used  Substance and Sexual Activity  . Alcohol use: Yes    Comment: socially  . Drug use: No  . Sexual activity: Not on file  Lifestyle  . Physical activity:    Days per week: Not on file    Minutes per session: Not on file  . Stress: Not on file  Relationships  . Social connections:    Talks on phone: Not on file    Gets together: Not on file    Attends religious service: Not on file    Active member of club or organization: Not on file    Attends meetings of clubs or organizations: Not on file    Relationship status: Not on file  . Intimate partner violence:    Fear of current or ex partner: Not on file    Emotionally abused: Not on file     Physically abused: Not on file    Forced sexual activity: Not on file  Other Topics Concern  . Not on file  Social History Narrative  . Not on file     History reviewed. No pertinent family history.   Review of Systems: General: negative for chills, fever, night sweats or weight changes.  Cardiovascular: negative for chest pain, orthopnea, palpitations, paroxysmal nocturnal dyspnea or shortness of breath Dermatological: negative for rash Respiratory: negative for cough or wheezing Urologic: negative for hematuria Abdominal: negative for nausea, vomiting, diarrhea, bright red blood per rectum, melena, or hematemesis Neurologic: negative for visual changes, syncope, or dizziness All other systems reviewed and are otherwise negative except as noted above.    Blood pressure (!) 162/86, pulse 84, height 5\' 11"  (1.803 m), weight 213 lb (96.6 kg), SpO2 98 %.  General appearance: alert, cooperative and no distress Neck: no carotid bruit and no JVD Lungs: clear to auscultation bilaterally Heart: regular rate and rhythm Extremities: trace edema Skin: Skin color, texture, turgor normal. 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   Repeat B/P by me was still elevated- the patient attributes this to white coat HTN. I will defer any adjustment to his  medications to Dr Bronson Ing, I'm hesitant to increase Coreg with his c/o fatigue and hesitant to increase his ACE with CRI. He probably needs an additional medication- possibly Amlodipine. I'll arrange for a f/u with Dr Bronson Ing in the next few weeks.  The pt will keep a log of his home B/P and bring this with him to that appointment.   Kerin Ransom PA-C 06/08/2018 9:30 AM

## 2018-06-08 NOTE — Assessment & Plan Note (Signed)
In setting of uncontrolled HTN, acute CHF, and CRI-

## 2018-06-08 NOTE — Patient Instructions (Signed)
Medication Instructions:  Your physician recommends that you continue on your current medications as directed. Please refer to the Current Medication list given to you today.  If you need a refill on your cardiac medications before your next appointment, please call your pharmacy.   Lab work: None today If you have labs (blood work) drawn today and your tests are completely normal, you will receive your results only by: Marland Kitchen MyChart Message (if you have MyChart) OR . A paper copy in the mail If you have any lab test that is abnormal or we need to change your treatment, we will call you to review the results.  Testing/Procedures: Your physician has requested that you have en exercise stress myoview. For further information please visit [email or web address]. Please follow instruction sheet, as given.   Follow-Up: At [facility], you and your health needs are our priority.  As part of our continuing mission to provide you with exceptional heart care, we have created designated Provider Care Teams.  These Care Teams include your primary Cardiologist (physician) and Advanced Practice Providers (APPs -  Physician Assistants and Nurse Practitioners) who all work together to provide you with the care you need, when you need it. You will need a follow up appointment in 2 weeks.    You may see [name], MD or one of the following Advanced Practice Providers on your designated Care Team:   Bernerd Pho, PA-C [facility]) . Ermalinda Barrios, PA-C ([place])  Any Other Special Instructions Will Be Listed Below (If Applicable). None

## 2018-06-11 MED ORDER — GLIPIZIDE 5 MG PO TABS: ORAL_TABLET | ORAL | 5 refills | Status: DC

## 2018-06-11 NOTE — Telephone Encounter (Signed)
Patient is aware of alll and new medication sent to [facility].

## 2018-06-13 DIAGNOSIS — I509 Heart failure, unspecified: Secondary | ICD-10-CM

## 2018-06-13 HISTORY — DX: Heart failure, unspecified: I50.9

## 2018-06-18 ENCOUNTER — Telehealth: Payer: Self-pay | Admitting: Family Medicine

## 2018-06-18 NOTE — Telephone Encounter (Signed)
Please confirm with patient that he is in fact taking this-if he is not then obviously we will need to refill it Also he will need a follow-up visit within the next 90 days

## 2018-06-19 NOTE — Telephone Encounter (Signed)
Left message to return call 

## 2018-06-20 ENCOUNTER — Encounter (HOSPITAL_COMMUNITY): Payer: Self-pay

## 2018-06-20 ENCOUNTER — Ambulatory Visit (HOSPITAL_COMMUNITY)
Admission: RE | Admit: 2018-06-20 | Discharge: 2018-06-20 | Disposition: A | Discharge status: Home / Self Care | Payer: Medicaid Other | Source: Ambulatory Visit | Attending: Cardiology | Admitting: Cardiology

## 2018-06-20 ENCOUNTER — Encounter (HOSPITAL_COMMUNITY)
Admission: RE | Admit: 2018-06-20 | Discharge: 2018-06-20 | Disposition: A | Discharge status: Home / Self Care | Payer: Medicaid Other | Source: Ambulatory Visit | Attending: Cardiology | Admitting: Cardiology

## 2018-06-20 DIAGNOSIS — R0600 Dyspnea, unspecified: Secondary | ICD-10-CM

## 2018-06-20 DIAGNOSIS — R0609 Other forms of dyspnea: Secondary | ICD-10-CM

## 2018-06-20 DIAGNOSIS — I429 Cardiomyopathy, unspecified: Secondary | ICD-10-CM | POA: Insufficient documentation

## 2018-06-20 DIAGNOSIS — I5041 Acute combined systolic (congestive) and diastolic (congestive) heart failure: Secondary | ICD-10-CM | POA: Diagnosis not present

## 2018-06-20 LAB — NM MYOCAR MULTI W/SPECT W/WALL MOTION / EF
Estimated workload: 6.6 METS
Exercise duration (min): 3 min
Exercise duration (sec): 52 s
LV dias vol: 171 mL (ref 62–150)
LV sys vol: 98 mL
MPHR: 159 {beats}/min
Peak HR: 122 {beats}/min
Percent HR: 76 %
RATE: 0.45
RPE: 13
Rest HR: 78 {beats}/min
SDS: 2
SRS: 6
SSS: 8
TID: 0.99

## 2018-06-20 MED ORDER — SODIUM CHLORIDE 0.9% FLUSH
INTRAVENOUS | Status: AC
  Administered 2018-06-20: 10 mL via INTRAVENOUS
  Filled 2018-06-20: qty 10

## 2018-06-20 MED ORDER — REGADENOSON 0.4 MG/5ML IV SOLN
INTRAVENOUS | Status: AC
  Administered 2018-06-20: 0.4 mg via INTRAVENOUS
  Filled 2018-06-20: qty 5

## 2018-06-20 MED ORDER — TECHNETIUM TC 99M TETROFOSMIN IV KIT
10.0000 | PACK | Freq: Once | INTRAVENOUS | Status: AC | PRN
  Administered 2018-06-20: 10.29 via INTRAVENOUS

## 2018-06-20 MED ORDER — TECHNETIUM TC 99M TETROFOSMIN IV KIT
30.0000 | PACK | Freq: Once | INTRAVENOUS | Status: AC | PRN
  Administered 2018-06-20: 30 via INTRAVENOUS

## 2018-06-20 NOTE — Telephone Encounter (Signed)
Tammy with [facility] calling back checking on status of refill. Pt is out of medication.

## 2018-06-20 NOTE — Telephone Encounter (Signed)
Spoke with [name] at [facility] while calling in another script. [name] states patient has been in twice about this med. Contacted patient. Pt states he is still taking this but wants provider input if he should still be taking it. Please advise. Thank you

## 2018-06-21 ENCOUNTER — Other Ambulatory Visit: Payer: Self-pay | Admitting: Family Medicine

## 2018-06-21 NOTE — Telephone Encounter (Signed)
Duplicate

## 2018-06-21 NOTE — Telephone Encounter (Signed)
He may have 3 refills I recommend a follow-up office visit somewhere in the next 30 days Bring all medicines to the office visit May be able to taper off of this medicine

## 2018-06-22 ENCOUNTER — Other Ambulatory Visit: Payer: Self-pay | Admitting: Family Medicine

## 2018-06-22 ENCOUNTER — Telehealth: Payer: Self-pay | Admitting: Cardiology

## 2018-06-22 NOTE — Telephone Encounter (Addendum)
Kerin Ransom PA-C read nuclear test, wants Dr.Koneswaran to review for possible cath.Will discuss with Dr.Koneswaran this afternoon.    Messaged Lurena Joiner to speak with patient and explain what the cath procedure involves, document risks and then let me know and I will set up cath

## 2018-06-22 NOTE — Telephone Encounter (Signed)
Has follow up with Bernerd Pho PA-C in near future. Can discuss medical management at that time if he is minimally symptomatic.

## 2018-06-22 NOTE — Telephone Encounter (Signed)
° ° °  Please return call to patient with stress test results

## 2018-06-22 NOTE — Telephone Encounter (Signed)
Pt has follow up scheduled

## 2018-06-22 NOTE — Telephone Encounter (Signed)
I called and spoke to the patient about his Myoview results. He is really not symptomatic and he has stage 3 CRI. He should really sit down with his cardiologist and decide wether or not he needs a cath. I will try and arrange this.  Kerin Ransom PA-C 06/22/2018 3:19 PM

## 2018-06-22 NOTE — Telephone Encounter (Signed)
[  06/22/2018 3:01 PM]  Pugh, Lucianne Muss:   Luke left message with patient Lurena Joiner said that Dr.K needs talk to pt decision to cath pt will be up to him due to bad creatinine

## 2018-07-02 ENCOUNTER — Other Ambulatory Visit: Payer: Self-pay | Admitting: Family Medicine

## 2018-07-02 NOTE — Telephone Encounter (Signed)
This +3 refills each

## 2018-07-03 ENCOUNTER — Ambulatory Visit: Payer: Self-pay | Admitting: Student

## 2018-07-06 ENCOUNTER — Ambulatory Visit (INDEPENDENT_AMBULATORY_CARE_PROVIDER_SITE_OTHER): Payer: Medicaid Other | Admitting: Cardiovascular Disease

## 2018-07-06 ENCOUNTER — Encounter: Payer: Self-pay | Admitting: Cardiovascular Disease

## 2018-07-06 VITALS — BP 132/76 | HR 90 | Ht 71.0 in | Wt 215.0 lb

## 2018-07-06 DIAGNOSIS — N183 Chronic kidney disease, stage 3 unspecified: Secondary | ICD-10-CM

## 2018-07-06 DIAGNOSIS — H43393 Other vitreous opacities, bilateral: Secondary | ICD-10-CM | POA: Diagnosis not present

## 2018-07-06 DIAGNOSIS — I25118 Atherosclerotic heart disease of native coronary artery with other forms of angina pectoris: Secondary | ICD-10-CM | POA: Diagnosis not present

## 2018-07-06 DIAGNOSIS — I5042 Chronic combined systolic (congestive) and diastolic (congestive) heart failure: Secondary | ICD-10-CM

## 2018-07-06 DIAGNOSIS — I10 Essential (primary) hypertension: Secondary | ICD-10-CM

## 2018-07-06 DIAGNOSIS — E785 Hyperlipidemia, unspecified: Secondary | ICD-10-CM

## 2018-07-06 MED ORDER — NITROGLYCERIN 0.4 MG SL SUBL: 0.4000 mg | SUBLINGUAL_TABLET | SUBLINGUAL | 3 refills | Status: DC | PRN

## 2018-07-06 NOTE — Addendum Note (Signed)
Addended by: Barbarann Ehlers A on: 07/06/2018 11:03 AM   Modules accepted: Orders

## 2018-07-06 NOTE — Progress Notes (Signed)
SUBJECTIVE: The patient presents for routine follow-up.  I last saw him while he was hospitalized in November 2019 with acute combined systolic and diastolic heart failure.  He also had hypertensive urgency and a troponin elevation.  Nuclear stress test on 06/20/2018 demonstrated prior myocardial infarction with no significant ischemic territories.  The defect involved the inferior, inferoseptal, and inferoapical walls.  LVEF 43%.  He is here with his wife.  He is applying for Medicaid and cannot afford cardiac rehabilitation.  He denies exertional chest pain and tightness.  He does have some exertional dyspnea when walking up hills but otherwise feels well.  He denies palpitations, leg swelling, orthopnea, and paroxysmal nocturnal dyspnea.  He has left hip pain and said he needs a hip replacement.  He also has floaters and is unable to drive due to this.  He has not seen an ophthalmologist.  He is scheduled to see his PCP on 07/16/2018.  They have questions about physical activity.   Social history: He is a Forensic psychologist.  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  He is also a horticulturist. He is married.  He is originally from [place] but moved here at the age of 21.  His father was a Garment/textile technologist.  Review of Systems: As per "subjective", otherwise negative.  Allergies  Allergen Reactions  . Bee Venom Anaphylaxis    Current Outpatient Medications  Medication Sig Dispense Refill  . aspirin 81 MG chewable tablet Chew 1 tablet (81 mg total) by mouth daily. 30 tablet 0  . atorvastatin (LIPITOR) 40 MG tablet Take 1 tablet (40 mg total) by mouth daily at 6 PM. 30 tablet 1  . carvedilol (COREG) 12.5 MG tablet TAKE 1 TABLET BY MOUTH TWICE A DAY WITH A MEAL 50 tablet 3  . furosemide (LASIX) 40 MG tablet TAKE ONE TABLET BY MOUTH TWICE A DAY 50 tablet 3  . glipiZIDE (GLUCOTROL) 5 MG tablet Two po BID 120 tablet 5  . hydrALAZINE  (APRESOLINE) 25 MG tablet TAKE 3 TABLETS BY MOUTH EVERY 8 HOURS 90 tablet 0  . lisinopril (PRINIVIL,ZESTRIL) 10 MG tablet TAKE ONE (1) TABLET BY MOUTH EVERY DAY 30 tablet 1   No current facility-administered medications for this visit.     Past Medical History:  Diagnosis Date  . Diabetes mellitus without complication ([place])   . Hypertension     Past Surgical History:  Procedure Laterality Date  . EXCISION MORTON'S NEUROMA      Social History   Socioeconomic History  . Marital status: Single    Spouse name: Not on file  . Number of children: Not on file  . Years of education: Not on file  . Highest education level: Not on file  Occupational History  . Not on file  Social Needs  . Financial resource strain: Not on file  . Food insecurity:    Worry: Not on file    Inability: Not on file  . Transportation needs:    Medical: Not on file    Non-medical: Not on file  Tobacco Use  . Smoking status: Former Smoker    Last attempt to quit: 07/06/1988    Years since quitting: 30.0  . Smokeless tobacco: Never Used  Substance and Sexual Activity  . Alcohol use: Yes    Comment: socially  . Drug use: No  . Sexual activity: Not on file  Lifestyle  . Physical activity:  Days per week: Not on file    Minutes per session: Not on file  . Stress: Not on file  Relationships  . Social connections:    Talks on phone: Not on file    Gets together: Not on file    Attends religious service: Not on file    Active member of club or organization: Not on file    Attends meetings of clubs or organizations: Not on file    Relationship status: Not on file  . Intimate partner violence:    Fear of current or ex partner: Not on file    Emotionally abused: Not on file    Physically abused: Not on file    Forced sexual activity: Not on file  Other Topics Concern  . Not on file  Social History Narrative  . Not on file     Vitals:   07/06/18 1003  BP: 132/76  Pulse: 90  SpO2: 97%    Weight: 215 lb (97.5 kg)  Height: 5\' 11"  (1.803 m)    Wt Readings from Last 3 Encounters:  07/06/18 215 lb (97.5 kg)  06/08/18 213 lb (96.6 kg)  05/08/18 203 lb 9.6 oz (92.4 kg)     PHYSICAL EXAM General: NAD HEENT: Normal. Neck: No JVD, no thyromegaly. Lungs: Clear to auscultation bilaterally with normal respiratory effort. CV: Regular rate and rhythm, normal S1/S2, no S3/S4, no murmur. No pretibial or periankle edema.  No carotid bruit.   Abdomen: Soft, nontender, no distention.  Neurologic: Alert and oriented.  Psych: Normal affect. Skin: Normal. Musculoskeletal: No gross deformities.    ECG: Reviewed above under Subjective   Labs: Lab Results  Component Value Date/Time   K 4.3 05/08/2018 02:41 PM   BUN 27 05/08/2018 02:41 PM   CREATININE 1.63 (H) 05/08/2018 02:41 PM   TSH 2.110 04/29/2018 09:00 AM   HGB 13.5 04/26/2018 08:00 AM     Lipids: Lab Results  Component Value Date/Time   LDLCALC 111 (H) 04/26/2018 10:11 AM   CHOL 176 04/26/2018 10:11 AM   TRIG 101 04/26/2018 10:11 AM   HDL 45 04/26/2018 10:11 AM       ASSESSMENT AND PLAN: 1.  Troponin elevation/abnormal nuclear stress test: Nuclear stress test reviewed above with evidence of prior inferior myocardial infarction with no current ischemia.  Currently on aspirin, atorvastatin, and carvedilol.  Symptomatically stable.  No anginal symptoms.  I will prescribe sublingual nitroglycerin to be used as needed.  I educated him on a physical activity regimen and the form of of walking approximately 30 minutes 5 days/week.  2.  Chronic combined systolic and diastolic heart failure: LVEF 45 to 50% with grade 1 diastolic dysfunction by most recent echocardiogram.  Currently on carvedilol, lisinopril, hydralazine, and Lasix 40 mg twice daily.  Euvolemic and stable.  NYHA class II symptoms.  No changes to therapy.  3.  Hypertension: Blood pressure is normal.  No changes to therapy.  4.  Chronic kidney disease stage  III: BUN 27 and creatinine 1.63 on 05/08/2018.  5.  Floaters: I encouraged him to speak with his PCP about an ophthalmology referral.  6.  Hyperlipidemia: LDL 111 04/26/2018.  Currently on atorvastatin 40 mg.  I will repeat lipids in February.    Disposition: Follow up 4 months   [name], M.D., F.A.C.C.

## 2018-07-06 NOTE — Patient Instructions (Signed)
Medication Instructions:  Take Nitroglycerin as directed  If you need a refill on your cardiac medications before your next appointment, please call your pharmacy.   Lab work: None today If you have labs (blood work) drawn today and your tests are completely normal, you will receive your results only by: Marland Kitchen MyChart Message (if you have MyChart) OR . A paper copy in the mail If you have any lab test that is abnormal or we need to change your treatment, we will call you to review the results.  Testing/Procedures: None today  Follow-Up: At [facility], you and your health needs are our priority.  As part of our continuing mission to provide you with exceptional heart care, we have created designated Provider Care Teams.  These Care Teams include your primary Cardiologist (physician) and Advanced Practice Providers (APPs -  Physician Assistants and Nurse Practitioners) who all work together to provide you with the care you need, when you need it. You will need a follow up appointment in 4 months.  Please call our office 2 months in advance to schedule this appointment.  You may see [name], MD or one of the following Advanced Practice Providers on your designated Care Team:   Bernerd Pho, PA-C [facility]) . Ermalinda Barrios, PA-C ([place])  Any Other Special Instructions Will Be Listed Below (If Applicable). None   Nitroglycerin sublingual tablets What is this medicine? NITROGLYCERIN (nye troe GLI ser in) is a type of vasodilator. It relaxes blood vessels, increasing the blood and oxygen supply to your heart. This medicine is used to relieve chest pain caused by angina. It is also used to prevent chest pain before activities like climbing stairs, going outdoors in cold weather, or sexual activity. This medicine may be used for other purposes; ask your health care provider or pharmacist if you have questions. COMMON BRAND NAME(S): Nitroquick, Nitrostat,  Nitrotab What should I tell my health care provider before I take this medicine? They need to know if you have any of these conditions: -anemia -head injury, recent stroke, or bleeding in the brain -liver disease -previous heart attack -an unusual or allergic reaction to nitroglycerin, other medicines, foods, dyes, or preservatives -pregnant or trying to get pregnant -breast-feeding How should I use this medicine? Take this medicine by mouth as needed. At the first sign of an angina attack (chest pain or tightness) place one tablet under your tongue. You can also take this medicine 5 to 10 minutes before an event likely to produce chest pain. Follow the directions on the prescription label. Let the tablet dissolve under the tongue. Do not swallow whole. Replace the dose if you accidentally swallow it. It will help if your mouth is not dry. Saliva around the tablet will help it to dissolve more quickly. Do not eat or drink, smoke or chew tobacco while a tablet is dissolving. If you are not better within 5 minutes after taking ONE dose of nitroglycerin, call 9-1-1 immediately to seek emergency medical care. Do not take more than 3 nitroglycerin tablets over 15 minutes. If you take this medicine often to relieve symptoms of angina, your doctor or health care professional may provide you with different instructions to manage your symptoms. If symptoms do not go away after following these instructions, it is important to call 9-1-1 immediately. Do not take more than 3 nitroglycerin tablets over 15 minutes. Talk to your pediatrician regarding the use of this medicine in children. Special care may be needed. Overdosage: If  you think you have taken too much of this medicine contact a poison control center or emergency room at once. NOTE: This medicine is only for you. Do not share this medicine with others. What if I miss a dose? This does not apply. This medicine is only used as needed. What may interact  with this medicine? Do not take this medicine with any of the following medications: -certain migraine medicines like ergotamine and dihydroergotamine (DHE) -medicines used to treat erectile dysfunction like sildenafil, tadalafil, and vardenafil -riociguat This medicine may also interact with the following medications: -alteplase -aspirin -heparin -medicines for high blood pressure -medicines for mental depression -other medicines used to treat angina -phenothiazines like chlorpromazine, mesoridazine, prochlorperazine, thioridazine This list may not describe all possible interactions. Give your health care provider a list of all the medicines, herbs, non-prescription drugs, or dietary supplements you use. Also tell them if you smoke, drink alcohol, or use illegal drugs. Some items may interact with your medicine. What should I watch for while using this medicine? Tell your doctor or health care professional if you feel your medicine is no longer working. Keep this medicine with you at all times. Sit or lie down when you take your medicine to prevent falling if you feel dizzy or faint after using it. Try to remain calm. This will help you to feel better faster. If you feel dizzy, take several deep breaths and lie down with your feet propped up, or bend forward with your head resting between your knees. You may get drowsy or dizzy. Do not drive, use machinery, or do anything that needs mental alertness until you know how this drug affects you. Do not stand or sit up quickly, especially if you are an older patient. This reduces the risk of dizzy or fainting spells. Alcohol can make you more drowsy and dizzy. Avoid alcoholic drinks. Do not treat yourself for coughs, colds, or pain while you are taking this medicine without asking your doctor or health care professional for advice. Some ingredients may increase your blood pressure. What side effects may I notice from receiving this medicine? Side  effects that you should report to your doctor or health care professional as soon as possible: -blurred vision -dry mouth -skin rash -sweating -the feeling of extreme pressure in the head -unusually weak or tired Side effects that usually do not require medical attention (report to your doctor or health care professional if they continue or are bothersome): -flushing of the face or neck -headache -irregular heartbeat, palpitations -nausea, vomiting This list may not describe all possible side effects. Call your doctor for medical advice about side effects. You may report side effects to FDA at 1-800-FDA-1088. Where should I keep my medicine? Keep out of the reach of children. Store at room temperature between 20 and 25 degrees C (68 and 77 degrees F). Store in Chief of Staff. Protect from light and moisture. Keep tightly closed. Throw away any unused medicine after the expiration date. NOTE: This sheet is a summary. It may not cover all possible information. If you have questions about this medicine, talk to your doctor, pharmacist, or health care provider.  2019 Elsevier/Gold Standard (2013-03-28 17:57:36)

## 2018-07-11 ENCOUNTER — Encounter (INDEPENDENT_AMBULATORY_CARE_PROVIDER_SITE_OTHER): Payer: Medicaid Other | Admitting: Ophthalmology

## 2018-07-11 DIAGNOSIS — H43813 Vitreous degeneration, bilateral: Secondary | ICD-10-CM

## 2018-07-11 DIAGNOSIS — E113513 Type 2 diabetes mellitus with proliferative diabetic retinopathy with macular edema, bilateral: Secondary | ICD-10-CM

## 2018-07-11 DIAGNOSIS — E113522 Type 2 diabetes mellitus with proliferative diabetic retinopathy with traction retinal detachment involving the macula, left eye: Secondary | ICD-10-CM | POA: Diagnosis not present

## 2018-07-11 DIAGNOSIS — H4313 Vitreous hemorrhage, bilateral: Secondary | ICD-10-CM

## 2018-07-11 DIAGNOSIS — E11311 Type 2 diabetes mellitus with unspecified diabetic retinopathy with macular edema: Secondary | ICD-10-CM

## 2018-07-11 DIAGNOSIS — H2513 Age-related nuclear cataract, bilateral: Secondary | ICD-10-CM

## 2018-07-11 DIAGNOSIS — H35033 Hypertensive retinopathy, bilateral: Secondary | ICD-10-CM

## 2018-07-11 DIAGNOSIS — I10 Essential (primary) hypertension: Secondary | ICD-10-CM

## 2018-07-16 ENCOUNTER — Encounter: Payer: Self-pay | Admitting: Family Medicine

## 2018-07-16 ENCOUNTER — Ambulatory Visit (INDEPENDENT_AMBULATORY_CARE_PROVIDER_SITE_OTHER): Payer: Medicaid Other | Admitting: Family Medicine

## 2018-07-16 VITALS — BP 128/70 | Temp 97.8°F | Ht 71.0 in | Wt 212.0 lb

## 2018-07-16 DIAGNOSIS — Z23 Encounter for immunization: Secondary | ICD-10-CM

## 2018-07-16 DIAGNOSIS — N183 Chronic kidney disease, stage 3 unspecified: Secondary | ICD-10-CM

## 2018-07-16 DIAGNOSIS — E782 Mixed hyperlipidemia: Secondary | ICD-10-CM | POA: Diagnosis not present

## 2018-07-16 DIAGNOSIS — E1122 Type 2 diabetes mellitus with diabetic chronic kidney disease: Secondary | ICD-10-CM

## 2018-07-16 DIAGNOSIS — I10 Essential (primary) hypertension: Secondary | ICD-10-CM | POA: Diagnosis not present

## 2018-07-16 LAB — POCT GLYCOSYLATED HEMOGLOBIN (HGB A1C): Hemoglobin A1C: 8.5 % — AB (ref 4.0–5.6)

## 2018-07-16 MED ORDER — GLIPIZIDE 10 MG PO TABS: ORAL_TABLET | ORAL | 5 refills | Status: DC

## 2018-07-16 NOTE — Progress Notes (Signed)
   Subjective:    Patient ID: [name], male    DOB: 1955-11-03, 63 y.o.   MRN: [id number]  HPI Patient is here today to follow up on his chronic health issues.  He has a history of hypertension, he takes Carvedilol 12.5 mg one bid,Lasix 40 mg one bid, Hydralazine 25 mg 3 tablets Q 8 hours,Lisinopril 10 mg once per day  Patient has history of cardiomyopathy had previous coronary artery disease followed by cardiology is limited on what he can do to get short of breath with activity.  Diabetes he takes Glipizide 5 mg two bid Denies any low sugar spells but cannot get his sugars down he is taking his medications unable to be on metformin Cannot afford insulin currently we are trying to get some assistance  Results for orders placed or performed in visit on 07/16/18  POCT glycosylated hemoglobin (Hb A1C)  Result Value Ref Range   Hemoglobin A1C 8.5 (A) 4.0 - 5.6 %   HbA1c POC (<> result, manual entry)     HbA1c, POC (prediabetic range)     HbA1c, POC (controlled diabetic range)       Hyperlipidemia:Atorvastatin 40 mg once per day.  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  Patient no longer driving Has very limited vision Subpar prognosis.  He also has a cough,drainage ongling for the last week.  Upper respiratory illness over the past week or so head congestion drainage coughing no wheezing difficulty breathing  Review of Systems  Constitutional: Negative for activity change, chills and fever.  HENT: Positive for congestion and rhinorrhea. Negative for ear pain.   Eyes: Negative for discharge.  Respiratory: Positive for cough. Negative for wheezing.   Cardiovascular: Negative for chest pain.  Gastrointestinal: Negative for nausea and vomiting.  Musculoskeletal: Negative for arthralgias.       Objective:   Physical Exam Vitals signs and nursing note reviewed.  Constitutional:      Appearance: He is well-developed.  HENT:     Head:  Normocephalic.     Mouth/Throat:     Pharynx: No oropharyngeal exudate.  Neck:     Musculoskeletal: Normal range of motion.  Cardiovascular:     Rate and Rhythm: Normal rate and regular rhythm.     Heart sounds: Normal heart sounds. No murmur.  Pulmonary:     Effort: Pulmonary effort is normal.     Breath sounds: Normal breath sounds. No wheezing.  Lymphadenopathy:     Cervical: No cervical adenopathy.  Skin:    General: Skin is warm and dry.  Neurological:     Motor: No abnormal muscle tone.           Assessment & Plan:  Diabetes with chronic kidney disease we will try to get him approved for insulin but this may be difficult. It would be very beneficial at this patient was on Medicaid to help him pay for his medicines  This patient because of his age healthcare issues and limited vision with poor prognosis would be wise to go ahead and apply for disability.  I believe this patient is disabled.  Patient will follow-up here in approximately 4 months sooner if problems.

## 2018-07-24 DIAGNOSIS — E113532 Type 2 diabetes mellitus with proliferative diabetic retinopathy with traction retinal detachment not involving the macula, left eye: Secondary | ICD-10-CM | POA: Diagnosis not present

## 2018-07-24 DIAGNOSIS — Z7984 Long term (current) use of oral hypoglycemic drugs: Secondary | ICD-10-CM | POA: Diagnosis not present

## 2018-07-24 DIAGNOSIS — H25813 Combined forms of age-related cataract, bilateral: Secondary | ICD-10-CM | POA: Diagnosis not present

## 2018-07-24 DIAGNOSIS — E113591 Type 2 diabetes mellitus with proliferative diabetic retinopathy without macular edema, right eye: Secondary | ICD-10-CM | POA: Diagnosis not present

## 2018-07-24 DIAGNOSIS — H4313 Vitreous hemorrhage, bilateral: Secondary | ICD-10-CM | POA: Diagnosis not present

## 2018-08-08 ENCOUNTER — Ambulatory Visit: Payer: Self-pay | Admitting: Family Medicine

## 2018-08-13 DIAGNOSIS — H4312 Vitreous hemorrhage, left eye: Secondary | ICD-10-CM | POA: Diagnosis not present

## 2018-08-13 DIAGNOSIS — E113532 Type 2 diabetes mellitus with proliferative diabetic retinopathy with traction retinal detachment not involving the macula, left eye: Secondary | ICD-10-CM | POA: Diagnosis not present

## 2018-10-03 ENCOUNTER — Other Ambulatory Visit: Payer: Self-pay | Admitting: Family Medicine

## 2018-10-16 ENCOUNTER — Other Ambulatory Visit: Payer: Self-pay | Admitting: Cardiovascular Disease

## 2018-10-16 DIAGNOSIS — E785 Hyperlipidemia, unspecified: Secondary | ICD-10-CM | POA: Diagnosis not present

## 2018-10-17 LAB — LIPID PANEL W/O CHOL/HDL RATIO
Cholesterol, Total: 158 mg/dL (ref 100–199)
HDL: 43 mg/dL (ref >39)
LDL Calculated: 90 mg/dL (ref 0–99)
Triglycerides: 126 mg/dL (ref 0–149)
VLDL Cholesterol Cal: 25 mg/dL (ref 5–40)

## 2018-10-23 ENCOUNTER — Telehealth: Payer: Self-pay | Admitting: Cardiovascular Disease

## 2018-10-23 NOTE — Telephone Encounter (Signed)
Virtual Visit Pre-Appointment Phone Call  "(Name), I am calling you today to discuss your upcoming appointment. We are currently trying to limit exposure to the virus that causes COVID-19 by seeing patients at home rather than in the office."  1. "What is the BEST phone number to call the day of the visit?" - include this in appointment notes  2. Do you have or have access to (through a family member/friend) a smartphone with video capability that we can use for your visit?" a. If yes - list this number in appt notes as cell (if different from BEST phone #) and list the appointment type as a VIDEO visit in appointment notes b. If no - list the appointment type as a PHONE visit in appointment notes  3. Confirm consent - "In the setting of the current Covid19 crisis, you are scheduled for a (phone or video) visit with your provider on (date) at (time).  Just as we do with many in-office visits, in order for you to participate in this visit, we must obtain consent.  If you'd like, I can send this to your mychart (if signed up) or email for you to review.  Otherwise, I can obtain your verbal consent now.  All virtual visits are billed to your insurance company just like a normal visit would be.  By agreeing to a virtual visit, we'd like you to understand that the technology does not allow for your provider to perform an examination, and thus may limit your provider's ability to fully assess your condition. If your provider identifies any concerns that need to be evaluated in person, we will make arrangements to do so.  Finally, though the technology is pretty good, we cannot assure that it will always work on either your or our end, and in the setting of a video visit, we may have to convert it to a phone-only visit.  In either situation, we cannot ensure that we have a secure connection.  Are you willing to proceed?" STAFF: Did the patient verbally acknowledge consent to telehealth visit? Document  YES/NO here: Yes  4. Advise patient to be prepared - "Two hours prior to your appointment, go ahead and check your blood pressure, pulse, oxygen saturation, and your weight (if you have the equipment to check those) and write them all down. When your visit starts, your provider will ask you for this information. If you have an Apple Watch or Kardia device, please plan to have heart rate information ready on the day of your appointment. Please have a pen and paper handy nearby the day of the visit as well."  5. Give patient instructions for MyChart download to smartphone OR [email or web address] as below if video visit (depending on what platform provider is using)  6. Inform patient they will receive a phone call 15 minutes prior to their appointment time (may be from unknown caller ID) so they should be prepared to answer    TELEPHONE CALL NOTE  [name] has been deemed a candidate for a follow-up tele-health visit to limit community exposure during the Covid-19 pandemic. I spoke with the patient via phone to ensure availability of phone/video source, confirm preferred email & phone number, and discuss instructions and expectations.  I reminded [name] to be prepared with any vital sign and/or heart rhythm information that could potentially be obtained via home monitoring, at the time of his visit. I reminded [name] to expect a phone call prior to  his visit.  Terry L Goins 10/23/2018 11:20 AM

## 2018-10-25 ENCOUNTER — Encounter: Payer: Self-pay | Admitting: Cardiovascular Disease

## 2018-10-25 ENCOUNTER — Telehealth (INDEPENDENT_AMBULATORY_CARE_PROVIDER_SITE_OTHER): Payer: Medicaid Other | Admitting: Cardiovascular Disease

## 2018-10-25 VITALS — Ht 71.0 in | Wt 215.0 lb

## 2018-10-25 DIAGNOSIS — N183 Chronic kidney disease, stage 3 unspecified: Secondary | ICD-10-CM

## 2018-10-25 DIAGNOSIS — I25118 Atherosclerotic heart disease of native coronary artery with other forms of angina pectoris: Secondary | ICD-10-CM

## 2018-10-25 DIAGNOSIS — M791 Myalgia, unspecified site: Secondary | ICD-10-CM

## 2018-10-25 DIAGNOSIS — I10 Essential (primary) hypertension: Secondary | ICD-10-CM

## 2018-10-25 DIAGNOSIS — E785 Hyperlipidemia, unspecified: Secondary | ICD-10-CM

## 2018-10-25 DIAGNOSIS — I5042 Chronic combined systolic (congestive) and diastolic (congestive) heart failure: Secondary | ICD-10-CM

## 2018-10-25 DIAGNOSIS — I429 Cardiomyopathy, unspecified: Secondary | ICD-10-CM

## 2018-10-25 MED ORDER — ROSUVASTATIN CALCIUM 5 MG PO TABS: 5.0000 mg | ORAL_TABLET | Freq: Every day | ORAL | 3 refills | Status: DC

## 2018-10-25 NOTE — Addendum Note (Signed)
Addended by: Debbora Lacrosse R on: 10/25/2018 01:49 PM   Modules accepted: Orders

## 2018-10-25 NOTE — Progress Notes (Signed)
Virtual Visit via Telephone Note   This visit type was conducted due to national recommendations for restrictions regarding the COVID-19 Pandemic (e.g. social distancing) in an effort to limit this patient's exposure and mitigate transmission in our community.  Due to his co-morbid illnesses, this patient is at least at moderate risk for complications without adequate follow up.  This format is felt to be most appropriate for this patient at this time.  The patient did not have access to video technology/had technical difficulties with video requiring transitioning to audio format only (telephone).  All issues noted in this document were discussed and addressed.  No physical exam could be performed with this format.  Please refer to the patient's chart for his  consent to telehealth for [facility]   Date:  10/25/2018   ID:  [name] [name], DOB [birth year], MRN [id number]  Patient Location: Home Provider Location: Home  PCP:  [name], MD  Cardiologist:  [name], MD  Electrophysiologist:  None   Evaluation Performed:  Follow-Up Visit  Chief Complaint:  CAD, CHF  History of Present Illness:    [name] [name] is a 63 y.o. male with a history of presumed CAD and ischemic cardiomyopathy/chronic systolic heart failure.  He denies chest pain, shortness of breath, orthopnea, and leg swelling. He's been having aching from both shoulders down both arms up to the elbow ever since starting statins. He denies leg aches.  The patient does not have symptoms concerning for COVID-19 infection (fever, chills, cough, or new shortness of breath).   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 He is originally from [place] but moved here at the age of 55. His father was a Garment/textile technologist.   Past Medical History:  Diagnosis Date  . Diabetes  mellitus without complication ([place])   . Hypertension    Past Surgical History:  Procedure Laterality Date  . EXCISION MORTON'S NEUROMA       [name] Meds  Medication Sig  . aspirin 81 MG chewable tablet Chew 1 tablet (81 mg total) by mouth daily.  Marland Kitchen atorvastatin (LIPITOR) 40 MG tablet Take 1 tablet (40 mg total) by mouth daily at 6 PM.  . carvedilol (COREG) 12.5 MG tablet TAKE 1 TABLET BY MOUTH TWICE A DAY WITH A MEAL  . furosemide (LASIX) 40 MG tablet TAKE ONE TABLET BY MOUTH TWICE A DAY  . glipiZIDE (GLUCOTROL) 10 MG tablet TAKE ONE TABLET BY MOUTH TWICE A DAY  . hydrALAZINE (APRESOLINE) 25 MG tablet TAKE 3 TABLETS BY MOUTH EVERY 8 HOURS  . lisinopril (PRINIVIL,ZESTRIL) 10 MG tablet TAKE ONE (1) TABLET BY MOUTH EVERY DAY  . nitroGLYCERIN (NITROSTAT) 0.4 MG SL tablet Place 1 tablet (0.4 mg total) under the tongue every 5 (five) minutes as needed.     Allergies:   Bee venom   Social History   Tobacco Use  . Smoking status: Former Smoker    Last attempt to quit: 07/06/1988    Years since quitting: 30.3  . Smokeless tobacco: Never Used  Substance Use Topics  . Alcohol use: Yes    Comment: socially  . Drug use: No     Family Hx: The patient's family history includes Cancer in his mother; Heart Problems in his father.  ROS:   Please see the history of present illness.     All other systems  reviewed and are negative.   Prior CV studies:   The following studies were reviewed today:  Nuclear stress test on 06/20/2018 demonstrated prior myocardial infarction with no significant ischemic territories.  The defect involved the inferior, inferoseptal, and inferoapical walls.  LVEF 43%.  Echocardiogram 04/26/18:  Study Conclusions  - Left ventricle: The cavity size was normal. There was minimal   concentric hypertrophy. Systolic function was mildly reduced. The   estimated ejection fraction was in the range of 45% to 50%.   Diffuse hypokinesis. Findings consistent with left  ventricular   diastolic dysfunction, grade indeterminate. Doppler parameters   are consistent with high ventricular filling pressure. - Mitral valve: Mildly calcified annulus. There was mild   regurgitation. - Left atrium: The atrium was severely dilated. - Right atrium: The atrium was mildly dilated. - Inferior vena cava: The vessel was dilated. The respirophasic   diameter changes were blunted (< 50%), consistent with elevated   central venous pressure. Estimated CVP 15 mmHg.  Labs/Other Tests and Data Reviewed:    EKG:  No ECG reviewed.  Recent Labs: 04/26/2018: B Natriuretic Peptide 2,062.0; Hemoglobin 13.5; Platelets 310 04/28/2018: Magnesium 2.0 04/29/2018: TSH 2.110 05/08/2018: BUN 27; Creatinine, Ser 1.63; Potassium 4.3; Sodium 141   Recent Lipid Panel Lab Results  Component Value Date/Time   CHOL 158 10/16/2018 09:17 AM   TRIG 126 10/16/2018 09:17 AM   HDL 43 10/16/2018 09:17 AM   CHOLHDL 3.9 04/26/2018 10:11 AM   LDLCALC 90 10/16/2018 09:17 AM    Wt Readings from Last 3 Encounters:  10/25/18 215 lb (97.5 kg)  07/16/18 212 lb 0.6 oz (96.2 kg)  07/06/18 215 lb (97.5 kg)     Objective:    Vital Signs:  Ht 5\' 11"  (1.803 m)   Wt 215 lb (97.5 kg)   BMI 29.99 kg/m    VITAL SIGNS:  reviewed  ASSESSMENT & PLAN:    1.  Troponin elevation/abnormal nuclear stress test/presumed CAD: Nuclear stress test reviewed above with evidence of prior inferior myocardial infarction with no [name] ischemia.  Currently on aspirin, atorvastatin, and carvedilol.  Symptomatically stable.  No anginal symptoms. Given myalgias, I will switch atorvastatin to rosuvastatin 5 mg daily.  2.  Chronic combined systolic and diastolic heart failure: LVEF 45 to 50% with grade 1 diastolic dysfunction by most recent echocardiogram.  Currently on carvedilol, lisinopril, hydralazine, and Lasix 40 mg twice daily.  Euvolemic and stable.  NYHA class II symptoms.  No changes to therapy. I'll check a BMET.   3.  Hypertension: No changes to therapy.  4.  Chronic kidney disease stage III: BUN 27 and creatinine 1.63 on 05/08/2018. I'll check a BMET.  5.  Hyperlipidemia: LDL on 10/16/18.  Currently on atorvastatin 40 mg which I had recommended increasing to 80 mg on 10/16/18.  Given myalgias, I will switch atorvastatin to rosuvastatin 5 mg daily. I'll repeat lipids in several months.   COVID-19 Education: The signs and symptoms of COVID-19 were discussed with the patient and how to seek care for testing (follow up with PCP or arrange E-visit).  The importance of social distancing was discussed today.  Time:   Today, I have spent 25 minutes with the patient with telehealth technology discussing the above problems.     Medication Adjustments/Labs and Tests Ordered: [name] medicines are reviewed at length with the patient today.  Concerns regarding medicines are outlined above.   Tests Ordered: No orders of the defined types were placed in this encounter.  Medication Changes: No orders of the defined types were placed in this encounter.   Disposition:  Follow up in 4 month(s)  Signed, [name], MD  10/25/2018 1:02 PM    [place]

## 2018-10-25 NOTE — Progress Notes (Signed)
Medication Instructions:  STOP LIPITOR   START CRESTOR 5 MG DAILY   Labwork: LIPIDS IN 3 MONTHS   BMET IN 1 WEEK   Testing/Procedures: NONE  Follow-Up: Your physician recommends that you schedule a follow-up appointment in: [place], [place]   Any Other Special Instructions Will Be Listed Below (If Applicable).     If you need a refill on your cardiac medications before your next appointment, please call your pharmacy.

## 2018-11-13 ENCOUNTER — Ambulatory Visit (INDEPENDENT_AMBULATORY_CARE_PROVIDER_SITE_OTHER): Payer: Medicaid Other | Admitting: Family Medicine

## 2018-11-13 ENCOUNTER — Encounter: Payer: Self-pay | Admitting: Family Medicine

## 2018-11-13 ENCOUNTER — Other Ambulatory Visit: Payer: Self-pay

## 2018-11-13 VITALS — BP 132/84 | Temp 97.7°F | Wt 214.0 lb

## 2018-11-13 DIAGNOSIS — E119 Type 2 diabetes mellitus without complications: Secondary | ICD-10-CM

## 2018-11-13 DIAGNOSIS — Z1211 Encounter for screening for malignant neoplasm of colon: Secondary | ICD-10-CM

## 2018-11-13 DIAGNOSIS — E782 Mixed hyperlipidemia: Secondary | ICD-10-CM | POA: Diagnosis not present

## 2018-11-13 DIAGNOSIS — I10 Essential (primary) hypertension: Secondary | ICD-10-CM | POA: Diagnosis not present

## 2018-11-13 DIAGNOSIS — K219 Gastro-esophageal reflux disease without esophagitis: Secondary | ICD-10-CM

## 2018-11-13 DIAGNOSIS — Z125 Encounter for screening for malignant neoplasm of prostate: Secondary | ICD-10-CM | POA: Diagnosis not present

## 2018-11-13 LAB — POCT GLYCOSYLATED HEMOGLOBIN (HGB A1C): Hemoglobin A1C: 8.1 % — AB (ref 4.0–5.6)

## 2018-11-13 NOTE — Progress Notes (Signed)
   Subjective:    Patient ID: [name], male    DOB: [birth year], 63 y.o.   MRN: [id number]  Diabetes  He presents for his follow-up diabetic visit. He has type 2 diabetes mellitus. Pertinent negatives for hypoglycemia include no headaches. Pertinent negatives for diabetes include no chest pain, no fatigue and no weakness. Current diabetic treatments: glipizide 10mg  one bid. He is compliant with treatment all of the time. Home blood sugar record trend: has been running high. Eye exam is current.  cardiologist recommended pt take [place] but it was too expensive.   Results for orders placed or performed in visit on 11/13/18  POCT glycosylated hemoglobin (Hb A1C)  Result Value Ref Range   Hemoglobin A1C 8.1 (A) 4.0 - 5.6 %   HbA1c POC (<> result, manual entry)     HbA1c, POC (prediabetic range)     HbA1c, POC (controlled diabetic range)      Review of Systems  Constitutional: Negative for activity change, fatigue and fever.  HENT: Negative for congestion and rhinorrhea.   Respiratory: Negative for cough and shortness of breath.   Cardiovascular: Negative for chest pain and leg swelling.  Gastrointestinal: Negative for abdominal pain, diarrhea and nausea.  Genitourinary: Negative for dysuria and hematuria.  Neurological: Negative for weakness and headaches.  Psychiatric/Behavioral: Negative for agitation and behavioral problems.       Objective:   Physical Exam Vitals signs reviewed.  Constitutional:      General: He is not in acute distress. HENT:     Head: Normocephalic and atraumatic.  Eyes:     General:        Right eye: No discharge.        Left eye: No discharge.  Neck:     Trachea: No tracheal deviation.  Cardiovascular:     Rate and Rhythm: Normal rate and regular rhythm.     Heart sounds: Normal heart sounds. No murmur.  Pulmonary:     Effort: Pulmonary effort is normal. No respiratory distress.     Breath sounds: Normal breath sounds.  Lymphadenopathy:    Cervical: No cervical adenopathy.  Skin:    General: Skin is warm and dry.  Neurological:     Mental Status: He is alert.     Coordination: Coordination normal.  Psychiatric:        Behavior: Behavior normal.           Assessment & Plan:  Diabetes subpar control unable to afford more expensive medicine we will look at other options to see if we can find a cheaper diabetic medicine otherwise stick with what he has along with dietary measures not a candidate for metformin  Blood pressure good control continue current measures tried exercise to build up exercise tolerance  Reflux intermittent not severe occurs more at nighttime when he eats late I encouraged him to avoid late night eating and also encouraged him to put head of bed on 6 inch blocks  Hyperlipidemia continue current measures watch diet continue medicine patient wonders if it is causing shoulder discomfort I think he has more rotator cuff arthralgias and degenerative disease I do not recommend any further and intervention there  Referral to Dr. Dereck Leep for colonoscopy  Blood test PSA  Should be noted that Victoza is covered by Medicaid we will discuss this with the patient to see if he would like to try this

## 2018-11-14 ENCOUNTER — Ambulatory Visit: Payer: Self-pay | Admitting: Family Medicine

## 2018-11-21 ENCOUNTER — Encounter: Payer: Self-pay | Admitting: Orthopaedic Surgery

## 2018-11-21 ENCOUNTER — Ambulatory Visit: Payer: Medicaid Other | Admitting: Orthopaedic Surgery

## 2018-11-21 ENCOUNTER — Ambulatory Visit (INDEPENDENT_AMBULATORY_CARE_PROVIDER_SITE_OTHER): Payer: Medicaid Other

## 2018-11-21 ENCOUNTER — Other Ambulatory Visit: Payer: Self-pay

## 2018-11-21 VITALS — BP 145/80 | HR 75 | Ht 71.0 in | Wt 225.0 lb

## 2018-11-21 DIAGNOSIS — G8929 Other chronic pain: Secondary | ICD-10-CM

## 2018-11-21 DIAGNOSIS — M25512 Pain in left shoulder: Secondary | ICD-10-CM | POA: Diagnosis not present

## 2018-11-21 NOTE — Progress Notes (Signed)
Office Visit Note   Patient: [name]           Date of Birth: Mar 01, 1956           MRN: [id number] Visit Date: 11/21/2018              Requested by: [name], MD [address], [place] [address] PCP: [name], MD   Assessment & Plan: Visit Diagnoses:  1. Chronic left shoulder pain     Plan: Possible rotator cuff tear left shoulder.  Pain has been chronic over about 6 months with recurrent episodes of anterior and lateral shoulder pain with referred discomfort to the mid arm.  Having compromise of his activities.  He has tried over-the-counter medicines and exercises.  Will order MRI scan  Follow-Up Instructions: No follow-ups on file.   Orders:  Orders Placed This Encounter  Procedures  . XR Shoulder Left  . MR Shoulder Left w/o contrast   No orders of the defined types were placed in this encounter.     Procedures: No procedures performed   Clinical Data: No additional findings.   Subjective: Chief Complaint  Patient presents with  . Left Shoulder - Pain  Patient presents today with left shoulder pain X1 month. No known injury. Patient states that the pain radiates down his left arm. He has pain with motion. No weakness, numbness, or tingling in his left arm. He is right hand dominant. He takes Advil for pain as needed.  [name] relates initial onset of left shoulder pain in January without injury or trauma.  He has had recurrent episodes of anterior lateral shoulder pain with certain activities.  Pain can be intense without numbness or tingling has had some trouble sleeping on that side  HPI  Review of Systems  Constitutional: Negative for fatigue.  HENT: Negative for ear pain.   Eyes: Negative for pain.  Respiratory: Negative for shortness of breath.   Cardiovascular: Negative for leg swelling.  Gastrointestinal: Negative for constipation and diarrhea.  Endocrine: Negative for cold intolerance and heat  intolerance.  Genitourinary: Negative for difficulty urinating.  Musculoskeletal: Negative for joint swelling.  Skin: Negative for rash.  Allergic/Immunologic: Negative for food allergies.  Neurological: Negative for weakness.  Hematological: Does not bruise/bleed easily.  Psychiatric/Behavioral: Positive for sleep disturbance.     Objective: Vital Signs: BP (!) 145/80   Pulse 75   Ht 5\' 11"  (1.803 m)   Wt 225 lb (102.1 kg)   BMI 31.38 kg/m   Physical Exam Constitutional:      Appearance: He is well-developed.  Eyes:     Pupils: Pupils are equal, round, and reactive to light.  Pulmonary:     Effort: Pulmonary effort is normal.  Skin:    General: Skin is warm and dry.  Neurological:     Mental Status: He is alert and oriented to person, place, and time.  Psychiatric:        Behavior: Behavior normal.     Ortho Exam awake alert and oriented x3.  Comfortable sitting.  Positive impingement and extreme of external rotation.  Positive empty can testing.  Full overhead motion but with some circuitous arc related to pain.  Good strength.  Biceps intact skin intact  Specialty Comments:  No specialty comments available.  Imaging: Xr Shoulder Left  Result Date: 11/21/2018 Films of the left shoulder obtained in 3 projections.  Humeral head is centered about the glenoid.  No ectopic calcification.  No joint space narrowing.  Mild to moderate degenerative changes of the chromic clavicular joint.  Some lateral downsloping of the acromion with what appears to be a type II configuration.    PMFS History: Patient Active Problem List   Diagnosis Date Noted  . Cardiomyopathy ([place]) 06/08/2018  . CRI (chronic renal insufficiency), stage 3 (moderate) ([place]) 06/08/2018  . Acute combined systolic (congestive) and diastolic (congestive) heart failure ([place]) 04/26/2018  . HTN (hypertension) 04/26/2018  . 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  Hypertensive urgency 04/26/2018  . Hyperlipidemia 04/26/2018  . Elevated troponin 04/26/2018   Past Medical History:  Diagnosis Date  . Diabetes mellitus without complication ([place])   . Hypertension     Family History  Problem Relation Age of Onset  . Cancer Mother   . Heart Problems Father     Past Surgical History:  Procedure Laterality Date  . EXCISION MORTON'S NEUROMA     Social History   Occupational History  . Not on file  Tobacco Use  . Smoking status: Former Smoker    Last attempt to quit: 07/06/1988    Years since quitting: 30.3  . Smokeless tobacco: Never Used  Substance and Sexual Activity  . Alcohol use: Yes    Comment: socially  . Drug use: No  . Sexual activity: Not on file

## 2018-11-28 ENCOUNTER — Ambulatory Visit: Payer: Medicaid Other | Admitting: Orthopaedic Surgery

## 2018-11-30 ENCOUNTER — Encounter: Payer: Self-pay | Admitting: Family Medicine

## 2018-12-03 ENCOUNTER — Encounter (INDEPENDENT_AMBULATORY_CARE_PROVIDER_SITE_OTHER): Payer: Self-pay | Admitting: *Deleted

## 2018-12-05 ENCOUNTER — Ambulatory Visit: Payer: Medicaid Other | Admitting: Orthopaedic Surgery

## 2018-12-05 ENCOUNTER — Telehealth: Payer: Self-pay | Admitting: Orthopaedic Surgery

## 2018-12-05 ENCOUNTER — Other Ambulatory Visit: Payer: Self-pay | Admitting: Orthopaedic Surgery

## 2018-12-05 DIAGNOSIS — G8929 Other chronic pain: Secondary | ICD-10-CM

## 2018-12-05 NOTE — Telephone Encounter (Signed)
Please advise 

## 2018-12-05 NOTE — Telephone Encounter (Signed)
[name] left a voicemail stating [name]'s insurance denied the MRI.  [name] is requesting a return call to discuss the next steps.

## 2018-12-05 NOTE — Telephone Encounter (Signed)
Tell pt that MRI has been denied-try course of PT and reevaluate.

## 2018-12-05 NOTE — Telephone Encounter (Signed)
Spoke with patient. I placed the order for therapy and relayed information to patient.

## 2018-12-13 ENCOUNTER — Encounter (HOSPITAL_COMMUNITY): Payer: Self-pay

## 2018-12-13 ENCOUNTER — Other Ambulatory Visit: Payer: Self-pay

## 2018-12-13 ENCOUNTER — Ambulatory Visit (HOSPITAL_COMMUNITY): Payer: Medicaid Other | Attending: Orthopaedic Surgery

## 2018-12-13 DIAGNOSIS — G8929 Other chronic pain: Secondary | ICD-10-CM | POA: Insufficient documentation

## 2018-12-13 DIAGNOSIS — R29898 Other symptoms and signs involving the musculoskeletal system: Secondary | ICD-10-CM | POA: Diagnosis not present

## 2018-12-13 DIAGNOSIS — M25512 Pain in left shoulder: Secondary | ICD-10-CM | POA: Insufficient documentation

## 2018-12-13 DIAGNOSIS — M25612 Stiffness of left shoulder, not elsewhere classified: Secondary | ICD-10-CM | POA: Insufficient documentation

## 2018-12-13 NOTE — Therapy (Addendum)
[place], [place], [address] Phone: [phone]   Fax:  [phone]  Occupational Therapy Evaluation  Patient Details  Name: [name] MRN: [id number] Date of Birth: 02-Nov-1955 Referring Provider (OT): Joni Fears, MD   Encounter Date: 12/13/2018  OT End of Session - 12/13/18 1545    Visit Number  1    Number of Visits  8    Date for OT Re-Evaluation  01/10/19    Authorization Type  Medicaid    Authorization Time Period  requesting initial 3 visits on 12/13/18    OT Start Time  1335    OT Stop Time  1415    OT Time Calculation (min)  40 min    Activity Tolerance  Patient tolerated treatment well    Behavior During Therapy  [facility] for tasks assessed/performed       Past Medical History:  Diagnosis Date  . Diabetes mellitus without complication ([place])   . Hypertension     Past Surgical History:  Procedure Laterality Date  . EXCISION MORTON'S NEUROMA      There were no vitals filed for this visit.  Subjective Assessment - 12/13/18 1346    Subjective   S: Certain movements just make it hurt.    Pertinent History  Patient is a 63 y/o male S/P chronic left shoulder pain with uknown cause. patient has been experiencing pain since January 2020. Insurance will not cover a MRI until he completes therapy first. Dr. Joni Fears has referred patient to occupational therapy for evaluation and treatment.    Patient Stated Goals  To decrease pain in shoulder.    Currently in Pain?  Yes   Only feels pain with certain movements.   Pain Score  8     Pain Location  Shoulder    Pain Orientation  Left    Pain Descriptors / Indicators  Shooting;Sharp    Pain Type  Chronic pain    Pain Radiating Towards  Runs down shoulder to elbow to hand.    Pain Onset  More than a month ago    Pain Frequency  Occasional    Aggravating Factors   Certain movements, leaving it in one position    Pain Relieving Factors  Stopping  the movement that hurts    Effect of Pain on Daily Activities  patient pushes through the pain    Multiple Pain Sites  No        [facility] OT Assessment - 12/13/18 1343      Assessment   Medical Diagnosis  left chronic shoulder pain    Referring Provider (OT)  Joni Fears, MD    Onset Date/Surgical Date  --   January 2020   Hand Dominance  Right    Next MD Visit  --   After therapy is completed.    Prior Therapy  None      Precautions   Precautions  None      Restrictions   Weight Bearing Restrictions  No      Balance Screen   Has the patient fallen in the past 6 months  No      Home  Environment   Family/patient expects to be discharged to:  Private residence    Lives With  Spouse      Prior Function   Level of [place] employed    Health and safety inspector for chemicals and [employer]  ADL   ADL comments  Difficulty quick movement , extending his arm back to reach an item.       Mobility   Mobility Status  Independent      Written Expression   Dominant Hand  Right      Vision - History   Baseline Vision  No visual deficits      Cognition   Overall Cognitive Status  Within Functional Limits for tasks assessed      Observation/Other Assessments   Observations  Gerber lift off test: fail    Focus on Therapeutic Outcomes (FOTO)   N/A      ROM / Strength   AROM / PROM / Strength  AROM;PROM;Strength      Palpation   Palpation comment  Max fascial restrictions/trigger point palpated at medial and anterior deltoid region and upper trapezius and scapularis region.       AROM   Overall AROM Comments  Assessed seated. IR/er abducted.    AROM Assessment Site  Shoulder    Right/Left Shoulder  Right;Left    Right Shoulder Flexion  160 Degrees    Right Shoulder ABduction  170 Degrees    Right Shoulder Internal Rotation  60 Degrees    Right Shoulder External Rotation  80 Degrees    Left Shoulder Flexion  150 Degrees    Left  Shoulder ABduction  158 Degrees    Left Shoulder Internal Rotation  50 Degrees    Left Shoulder External Rotation  30 Degrees      PROM   Overall PROM   Within functional limits for tasks performed    PROM Assessment Site  Shoulder    Right/Left Shoulder  Left;Right      Strength   Strength Assessment Site  Shoulder    Right/Left Shoulder  Right;Left    Right Shoulder Flexion  5/5    Right Shoulder ABduction  5/5    Right Shoulder Internal Rotation  5/5    Right Shoulder External Rotation  5/5    Left Shoulder Flexion  5/5    Left Shoulder ABduction  5/5    Left Shoulder Internal Rotation  5/5    Left Shoulder External Rotation  5/5                      OT Education - 12/13/18 1545    Education Details  shoulder stretches and self trigger point release with ball    Person(s) Educated  Patient    Methods  Explanation;Demonstration;Verbal cues;Handout    Comprehension  Verbalized understanding       OT Short Term Goals - 12/13/18 1548      OT SHORT TERM GOAL #1   Title  Patient will be educated and independent with HEP in order to faciliate his progress in therapy and allow him to participate in all daily tasks with less pain and more comfort.    Time  4    Period  Weeks    Status  New    Target Date  01/10/19      OT SHORT TERM GOAL #2   Title  Patient will increase A/ROM of his LUE to WNL in order to complete reaching tasks with less difficulty.    Time  4    Period  Weeks    Status  New      OT SHORT TERM GOAL #3   Title  Patient will increase scapular and shoulder stability in his LUE by  reporting a decrease in pain of approximately 5/10 or less when completing daily tasks.    Time  4    Period  Weeks    Status  New      OT SHORT TERM GOAL #4   Title  patient will decrease fascial restrictions to min amount or less in order to increase functiona mobility and comfort level when completing reaching tasks.    Time  4    Period  Weeks    Status   New               Plan - 12/13/18 1546    Clinical Impression Statement  A: Patient is a 63 y/o male S/P left chronic shoulder pain with suspected tear causing increased fascial restrictions, pain, and decreased ROM and strength resulting in difficulty completing daily tasks with his LUE.    OT Occupational Profile and History  Problem Focused Assessment - Including review of records relating to presenting problem    Occupational performance deficits (Please refer to evaluation for details):  ADL's;Rest and Sleep;IADL's    Body Structure / Function / Physical Skills  ADL;ROM;Fascial restriction;Strength;Pain;UE functional use    Rehab Potential  Good    Clinical Decision Making  Limited treatment options, no task modification necessary    Comorbidities Affecting Occupational Performance:  May have comorbidities impacting occupational performance    Modification or Assistance to Complete Evaluation   No modification of tasks or assist necessary to complete eval    OT Frequency  2x / week    OT Duration  4 weeks    OT Treatment/Interventions  Self-care/ADL training;Ultrasound;Patient/family education;DME and/or AE instruction;Passive range of motion;Cryotherapy;Electrical Stimulation;Moist Heat;Therapeutic exercise;Neuromuscular education;Manual Therapy;Therapeutic activities    Plan  P: Patient will benefit from skilled OT services to increase functional performance during ADL and sleep tasks and allow him to use his LUE as normally as possible with less pain. Treatment Plan: Myofascial release, manual stretching, update HEP as needed. A/ROM with scapular focus, shoulder and scapular strengthening and stability. modalities PRN.    Consulted and Agree with Plan of Care  Patient       Patient will benefit from skilled therapeutic intervention in order to improve the following deficits and impairments:   Body Structure / Function / Physical Skills: ADL, ROM, Fascial restriction, Strength,  Pain, UE functional use       Visit Diagnosis: 1. Other symptoms and signs involving the musculoskeletal system   2. Stiffness of left shoulder, not elsewhere classified   3. Chronic left shoulder pain       Problem List Patient Active Problem List   Diagnosis Date Noted  . Cardiomyopathy ([place]) 06/08/2018  . CRI (chronic renal insufficiency), stage 3 (moderate) ([place]) 06/08/2018  . Acute combined systolic (congestive) and diastolic (congestive) heart failure ([place]) 04/26/2018  . HTN (hypertension) 04/26/2018  . 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 Hypertensive urgency 04/26/2018  . Hyperlipidemia 04/26/2018  . Elevated troponin 04/26/2018   Ailene Ravel, OTR/L,CBIS  [phone]  12/13/2018, 4:13 PM  [facility] [address] [place], [place], [address] Phone: [phone]   Fax:  [phone]  Name: [name] MRN: [id number] Date of Birth: Oct 31, 1955

## 2018-12-13 NOTE — Patient Instructions (Signed)
Complete the following stretching 1-2 times a day or more as you feel you need to.   Self Myofascial Release (SMFR) of Mid-back  Position a ball between your shoulder blade and spine. Lean into the ball (to tolerance). Hold the pressure with your muscles relaxed until pain subsides. Then you can make slow and small side-to-side or up-and-down movements, or slowly bring your arm across your body. Spend 2-3 minutes.     Complete the following exercises 2-3 times a day.  Doorway Stretch  Place each hand opposite each other on the doorway. (You can change where you feel the stretch by moving arms higher or lower.) Step through with one foot and bend front knee until a stretch is felt and hold. Step through with the opposite foot on the next rep. Hold for __10-15___ seconds. Repeat __2__times.       Wall Flexion  Slide your arm up the wall or door frame until a stretch is felt in your shoulder . Hold for 10-15 seconds. Complete 2 times     Shoulder Abduction Stretch  Stand side ways by a wall with affected up on wall. Gently step in toward wall to feel stretch. Hold for 10-15 seconds. Complete 2 times.

## 2018-12-17 ENCOUNTER — Other Ambulatory Visit: Payer: Medicaid Other

## 2018-12-18 ENCOUNTER — Telehealth (HOSPITAL_COMMUNITY): Payer: Self-pay | Admitting: Family Medicine

## 2018-12-18 NOTE — Telephone Encounter (Signed)
7/7 called to offer 7/9 at 1015 but he said he had an appt already with his dr.

## 2018-12-20 DIAGNOSIS — H25813 Combined forms of age-related cataract, bilateral: Secondary | ICD-10-CM | POA: Diagnosis not present

## 2018-12-20 DIAGNOSIS — E113532 Type 2 diabetes mellitus with proliferative diabetic retinopathy with traction retinal detachment not involving the macula, left eye: Secondary | ICD-10-CM | POA: Diagnosis not present

## 2018-12-20 DIAGNOSIS — E113591 Type 2 diabetes mellitus with proliferative diabetic retinopathy without macular edema, right eye: Secondary | ICD-10-CM | POA: Diagnosis not present

## 2018-12-20 DIAGNOSIS — H4313 Vitreous hemorrhage, bilateral: Secondary | ICD-10-CM | POA: Diagnosis not present

## 2018-12-26 ENCOUNTER — Other Ambulatory Visit: Payer: Self-pay | Admitting: Family Medicine

## 2018-12-26 NOTE — Telephone Encounter (Signed)
Patient states that the cardiologist switched him to 3 tablets BID(this script says 3 tablets TID)and have released him and needs this taken over by [place]

## 2018-12-27 NOTE — Telephone Encounter (Signed)
May change prescription to 3 tablets twice daily May have 90-day supply with 1 refill

## 2018-12-28 ENCOUNTER — Ambulatory Visit (HOSPITAL_COMMUNITY): Payer: Medicaid Other | Admitting: Occupational Therapy

## 2018-12-28 ENCOUNTER — Other Ambulatory Visit: Payer: Self-pay

## 2018-12-28 ENCOUNTER — Encounter (HOSPITAL_COMMUNITY): Payer: Self-pay | Admitting: Occupational Therapy

## 2018-12-28 DIAGNOSIS — Z125 Encounter for screening for malignant neoplasm of prostate: Secondary | ICD-10-CM | POA: Diagnosis not present

## 2018-12-28 DIAGNOSIS — M25612 Stiffness of left shoulder, not elsewhere classified: Secondary | ICD-10-CM | POA: Diagnosis not present

## 2018-12-28 DIAGNOSIS — E119 Type 2 diabetes mellitus without complications: Secondary | ICD-10-CM | POA: Diagnosis not present

## 2018-12-28 DIAGNOSIS — R29898 Other symptoms and signs involving the musculoskeletal system: Secondary | ICD-10-CM

## 2018-12-28 DIAGNOSIS — M25512 Pain in left shoulder: Secondary | ICD-10-CM | POA: Diagnosis not present

## 2018-12-28 DIAGNOSIS — G8929 Other chronic pain: Secondary | ICD-10-CM

## 2018-12-28 NOTE — Therapy (Signed)
[place], [place], [address] Phone: [phone]   Fax:  [phone]  Occupational Therapy Treatment  Patient Details  Name: [name] MRN: [id number] Date of Birth: Jul 30, 1955 Referring Provider (OT): Joni Fears, MD   Encounter Date: 12/28/2018  OT End of Session - 12/28/18 1451    Visit Number  2    Number of Visits  8    Date for OT Re-Evaluation  01/10/19    Authorization Type  Medicaid    Authorization Time Period  3 visits approved 7/15-7/28/20    OT Start Time  1410    OT Stop Time  1450    OT Time Calculation (min)  40 min    Activity Tolerance  Patient tolerated treatment well    Behavior During Therapy  [facility] for tasks assessed/performed       Past Medical History:  Diagnosis Date  . Diabetes mellitus without complication ([place])   . Hypertension     Past Surgical History:  Procedure Laterality Date  . EXCISION MORTON'S NEUROMA      There were no vitals filed for this visit.  Subjective Assessment - 12/28/18 1409    Subjective   S: It's feeling a little better today.    Currently in Pain?  No/denies         [facility] OT Assessment - 12/28/18 1408      Assessment   Medical Diagnosis  left chronic shoulder pain      Precautions   Precautions  None               OT Treatments/Exercises (OP) - 12/28/18 1409      Exercises   Exercises  Shoulder      Shoulder Exercises: Supine   Protraction  PROM;5 reps;AROM;10 reps    Horizontal ABduction  PROM;5 reps;AROM;10 reps    External Rotation  PROM;5 reps;AROM;10 reps    Internal Rotation  PROM;5 reps;AROM;10 reps    Flexion  PROM;5 reps;AROM;10 reps    ABduction  PROM;5 reps;AROM;10 reps      Shoulder Exercises: Standing   Protraction  AROM;10 reps    Horizontal ABduction  AROM;10 reps    External Rotation  AROM;10 reps    Internal Rotation  AROM;10 reps    Flexion  AROM;10 reps    ABduction  AROM;10 reps    Extension  Theraband;10 reps    Theraband Level (Shoulder Extension)  Level 2 (Red)    Row  Theraband;10 reps    Theraband Level (Shoulder Row)  Level 2 (Red)    Retraction  Theraband;10 reps    Theraband Level (Shoulder Retraction)  Level 2 (Red)      Shoulder Exercises: ROM/Strengthening   Proximal Shoulder Strengthening, Supine  10X each no rest breaks    Other ROM/Strengthening Exercises  proximal shoulder strengthening on doorway: 1' flexion      Manual Therapy   Manual Therapy  Myofascial release    Manual therapy comments  completed separately from therapeutic exercise    Myofascial Release  Myofascial release to left upper arm, deltoid, trapezius, and scapularis regions to decrease pain and fascial restrictions and improve joint ROM               OT Short Term Goals - 12/28/18 1454      OT SHORT TERM GOAL #1   Title  Patient will be educated and independent with HEP in order to faciliate his progress in therapy and  allow him to participate in all daily tasks with less pain and more comfort.    Time  4    Period  Weeks    Status  On-going    Target Date  01/10/19      OT SHORT TERM GOAL #2   Title  Patient will increase A/ROM of his LUE to WNL in order to complete reaching tasks with less difficulty.    Time  4    Period  Weeks    Status  On-going      OT SHORT TERM GOAL #3   Title  Patient will increase scapular and shoulder stability in his LUE by reporting a decrease in pain of approximately 5/10 or less when completing daily tasks.    Time  4    Period  Weeks    Status  On-going      OT SHORT TERM GOAL #4   Title  patient will decrease fascial restrictions to min amount or less in order to increase functiona mobility and comfort level when completing reaching tasks.    Time  4    Period  Weeks    Status  On-going               Plan - 12/28/18 1451    Clinical Impression Statement  A: Initiated manual therapy, passive stretching, A/ROM, and  scapular strengthening this session. Pt with ROM WFL both active and passive, max diffculty with scapular retraction OT providing tactile assist initially. Verbal cuing for form and technique.    Body Structure / Function / Physical Skills  ADL;ROM;Fascial restriction;Strength;Pain;UE functional use    Plan  P: Continue with A/ROM and update for HEP, work on improving scapular retraction and add pro/ret/elev/dep       Patient will benefit from skilled therapeutic intervention in order to improve the following deficits and impairments:   Body Structure / Function / Physical Skills: ADL, ROM, Fascial restriction, Strength, Pain, UE functional use       Visit Diagnosis: 1. Other symptoms and signs involving the musculoskeletal system   2. Stiffness of left shoulder, not elsewhere classified   3. Chronic left shoulder pain       Problem List Patient Active Problem List   Diagnosis Date Noted  . Cardiomyopathy ([place]) 06/08/2018  . CRI (chronic renal insufficiency), stage 3 (moderate) ([place]) 06/08/2018  . Acute combined systolic (congestive) and diastolic (congestive) heart failure ([place]) 04/26/2018  . HTN (hypertension) 04/26/2018  . 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 Hypertensive urgency 04/26/2018  . Hyperlipidemia 04/26/2018  . Elevated troponin 04/26/2018   Guadelupe Sabin, OTR/L  [phone] 12/28/2018, 2:56 PM  [name] [address] [place], [place], [address] Phone: [phone]   Fax:  [phone]  Name: [name] MRN: [id number] Date of Birth: 06-23-1955

## 2018-12-29 LAB — MICROALBUMIN / CREATININE URINE RATIO
Creatinine, Urine: 111.3 mg/dL
Microalb/Creat Ratio: 601 mg/g creat — ABNORMAL HIGH (ref 0–29)
Microalbumin, Urine: 669.4 ug/mL

## 2018-12-29 LAB — PSA: Prostate Specific Ag, Serum: 1.7 ng/mL (ref 0.0–4.0)

## 2018-12-31 ENCOUNTER — Other Ambulatory Visit: Payer: Self-pay | Admitting: Cardiovascular Disease

## 2018-12-31 DIAGNOSIS — E785 Hyperlipidemia, unspecified: Secondary | ICD-10-CM | POA: Diagnosis not present

## 2018-12-31 DIAGNOSIS — I25118 Atherosclerotic heart disease of native coronary artery with other forms of angina pectoris: Secondary | ICD-10-CM | POA: Diagnosis not present

## 2018-12-31 DIAGNOSIS — I10 Essential (primary) hypertension: Secondary | ICD-10-CM | POA: Diagnosis not present

## 2019-01-01 ENCOUNTER — Other Ambulatory Visit: Payer: Self-pay

## 2019-01-01 ENCOUNTER — Encounter (HOSPITAL_COMMUNITY): Payer: Self-pay

## 2019-01-01 ENCOUNTER — Ambulatory Visit (HOSPITAL_COMMUNITY): Payer: Medicaid Other

## 2019-01-01 DIAGNOSIS — G8929 Other chronic pain: Secondary | ICD-10-CM

## 2019-01-01 DIAGNOSIS — R29898 Other symptoms and signs involving the musculoskeletal system: Secondary | ICD-10-CM | POA: Diagnosis not present

## 2019-01-01 DIAGNOSIS — M25512 Pain in left shoulder: Secondary | ICD-10-CM | POA: Diagnosis not present

## 2019-01-01 DIAGNOSIS — M25612 Stiffness of left shoulder, not elsewhere classified: Secondary | ICD-10-CM | POA: Diagnosis not present

## 2019-01-01 LAB — SPECIMEN STATUS REPORT

## 2019-01-01 LAB — LIPID PANEL W/O CHOL/HDL RATIO
Cholesterol, Total: 152 mg/dL (ref 100–199)
HDL: 41 mg/dL (ref >39)
LDL Calculated: 77 mg/dL (ref 0–99)
Triglycerides: 172 mg/dL — ABNORMAL HIGH (ref 0–149)
VLDL Cholesterol Cal: 34 mg/dL (ref 5–40)

## 2019-01-01 LAB — BASIC METABOLIC PANEL
BUN/Creatinine Ratio: 13 (ref 10–24)
BUN: 28 mg/dL — ABNORMAL HIGH (ref 8–27)
CO2: 22 mmol/L (ref 20–29)
Calcium: 9 mg/dL (ref 8.6–10.2)
Chloride: 105 mmol/L (ref 96–106)
Creatinine, Ser: 2.12 mg/dL — ABNORMAL HIGH (ref 0.76–1.27)
GFR calc Af Amer: 37 mL/min/{1.73_m2} — ABNORMAL LOW (ref >59)
GFR calc non Af Amer: 32 mL/min/{1.73_m2} — ABNORMAL LOW (ref >59)
Glucose: 179 mg/dL — ABNORMAL HIGH (ref 65–99)
Potassium: 4.8 mmol/L (ref 3.5–5.2)
Sodium: 141 mmol/L (ref 134–144)

## 2019-01-01 NOTE — Therapy (Signed)
[place], [place], [address] Phone: [phone]   Fax:  [phone]  Occupational Therapy Treatment  Patient Details  Name: [name] MRN: [id number] Date of Birth: 03/19/1956 Referring Provider (OT): Joni Fears, MD   Encounter Date: 01/01/2019  OT End of Session - 01/01/19 1432    Visit Number  3    Number of Visits  8    Date for OT Re-Evaluation  01/10/19    Authorization Type  Medicaid    Authorization Time Period  3 visits approved 7/15-7/28/20    Authorization - Visit Number  2    Authorization - Number of Visits  3    OT Start Time  0814    OT Stop Time  1410    OT Time Calculation (min)  37 min    Activity Tolerance  Patient tolerated treatment well    Behavior During Therapy  [facility] for tasks assessed/performed       Past Medical History:  Diagnosis Date  . Diabetes mellitus without complication ([place])   . Hypertension     Past Surgical History:  Procedure Laterality Date  . EXCISION MORTON'S NEUROMA      There were no vitals filed for this visit.  Subjective Assessment - 01/01/19 1352    Subjective   S: I only have pain when I'm sleeping on it or if I reach the wrong way.    Currently in Pain?  No/denies         [facility] OT Assessment - 01/01/19 1434      Assessment   Medical Diagnosis  left chronic shoulder pain      Precautions   Precautions  None               OT Treatments/Exercises (OP) - 01/01/19 1354      Exercises   Exercises  Shoulder      Shoulder Exercises: Supine   Protraction  PROM;5 reps    Horizontal ABduction  PROM;5 reps;AROM;12 reps    External Rotation  PROM;5 reps   abducted   Internal Rotation  PROM;5 reps   abducted   Flexion  PROM;5 reps;AROM;12 reps    ABduction  PROM;5 reps;AROM;12 reps      Shoulder Exercises: Standing   Extension  Theraband;10 reps    Theraband Level (Shoulder Extension)  Level 2 (Red)    Row  Theraband;10  reps    Theraband Level (Shoulder Row)  Level 2 (Red)    Retraction  Theraband;10 reps    Theraband Level (Shoulder Retraction)  Level 2 (Red)      Shoulder Exercises: ROM/Strengthening   UBE (Upper Arm Bike)  level 5 3' reverse only    Pace: 11.0-12.0   Proximal Shoulder Strengthening, Seated  A/ROM; 12X; no rest breaks    Ball on Wall  1' flexion 1' abduction green ball    Other ROM/Strengthening Exercises  Red loop band; wall slides 5X, Wall clock 3 positions; 10X each             OT Education - 01/01/19 1432    Education Details  red band scapular strengthening.    Person(s) Educated  Patient    Methods  Explanation;Demonstration;Handout;Verbal cues    Comprehension  Returned demonstration;Verbalized understanding       OT Short Term Goals - 12/28/18 1454      OT SHORT TERM GOAL #1   Title  Patient will be educated and independent with HEP  in order to faciliate his progress in therapy and allow him to participate in all daily tasks with less pain and more comfort.    Time  4    Period  Weeks    Status  On-going    Target Date  01/10/19      OT SHORT TERM GOAL #2   Title  Patient will increase A/ROM of his LUE to WNL in order to complete reaching tasks with less difficulty.    Time  4    Period  Weeks    Status  On-going      OT SHORT TERM GOAL #3   Title  Patient will increase scapular and shoulder stability in his LUE by reporting a decrease in pain of approximately 5/10 or less when completing daily tasks.    Time  4    Period  Weeks    Status  On-going      OT SHORT TERM GOAL #4   Title  patient will decrease fascial restrictions to min amount or less in order to increase functiona mobility and comfort level when completing reaching tasks.    Time  4    Period  Weeks    Status  On-going               Plan - 01/01/19 1410    Clinical Impression Statement  A: Continued to focus on scapular strengthening during session while completing band loop  exercises and scapular strengthening with red/green band. Pt with full passive and active ROM although reports increased pain during IR/er with shoulder abducted during passive stretching. Patient was provided VC for form and technique. HEP was updated to include scapular strengthening with band.    Plan  P: Add sidelying scapular strengthening. Prone scapular strengthening with approximately 3# hand weight. request additional visits from [facility].    Consulted and Agree with Plan of Care  Patient       Patient will benefit from skilled therapeutic intervention in order to improve the following deficits and impairments:           Visit Diagnosis: 1. Other symptoms and signs involving the musculoskeletal system   2. Stiffness of left shoulder, not elsewhere classified   3. Chronic left shoulder pain       Problem List Patient Active Problem List   Diagnosis Date Noted  . Cardiomyopathy ([place]) 06/08/2018  . CRI (chronic renal insufficiency), stage 3 (moderate) ([place]) 06/08/2018  . Acute combined systolic (congestive) and diastolic (congestive) heart failure ([place]) 04/26/2018  . HTN (hypertension) 04/26/2018  . 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 Hypertensive urgency 04/26/2018  . Hyperlipidemia 04/26/2018  . Elevated troponin 04/26/2018   Ailene Ravel, OTR/L,CBIS  [phone]  01/01/2019, 2:34 PM  [facility] [address] [place], [place], [address] Phone: [phone]   Fax:  [phone]  Name: [name] MRN: [id number] Date of Birth: 1955/09/21

## 2019-01-01 NOTE — Patient Instructions (Signed)
(  Home) Extension: Isometric / Bilateral Arm Retraction - Sitting   Facing anchor, hold hands and elbow at shoulder height, with elbow bent.  Pull arms back to squeeze shoulder blades together. Repeat 10-15 times. 1-3 times/day.   (Clinic) Extension / Flexion (Assist)   Face anchor, pull arms back, keeping elbow straight, and squeze shoulder blades together. Repeat 10-15 times. 1-3 times/day.   Copyright  VHI. All rights reserved.   (Home) Retraction: Row - Bilateral (Anchor)   Facing anchor, arms reaching forward, pull hands toward stomach, keeping elbows bent and at your sides and pinching shoulder blades together. Repeat 10-15 times. 1-3 times/day.   Copyright  VHI. All rights reserved.  

## 2019-01-03 ENCOUNTER — Encounter (HOSPITAL_COMMUNITY): Payer: Self-pay

## 2019-01-03 ENCOUNTER — Other Ambulatory Visit: Payer: Self-pay

## 2019-01-03 ENCOUNTER — Telehealth: Payer: Self-pay

## 2019-01-03 ENCOUNTER — Ambulatory Visit (HOSPITAL_COMMUNITY): Payer: Medicaid Other

## 2019-01-03 DIAGNOSIS — M25612 Stiffness of left shoulder, not elsewhere classified: Secondary | ICD-10-CM | POA: Diagnosis not present

## 2019-01-03 DIAGNOSIS — R29898 Other symptoms and signs involving the musculoskeletal system: Secondary | ICD-10-CM | POA: Diagnosis not present

## 2019-01-03 DIAGNOSIS — G8929 Other chronic pain: Secondary | ICD-10-CM

## 2019-01-03 DIAGNOSIS — M25512 Pain in left shoulder: Secondary | ICD-10-CM | POA: Diagnosis not present

## 2019-01-03 NOTE — Telephone Encounter (Signed)
-----   Message from Arnoldo Lenis, MD sent at 01/03/2019  4:23 PM EDT ----- Kidney function has decreased, please verify taking lasix 40mg  bid and if so lower to 40mg  in AM and 20mg  in PM. Repeat BMET in 2 weeks.    Zandra Abts MD

## 2019-01-03 NOTE — Therapy (Addendum)
[facility] [address] [place], [place], [address] Phone: [phone]   Fax:  [phone]  Occupational Therapy Treatment Medicaid re-auth Patient Details  Name: [name] MRN: [id number] Date of Birth: [birth year] Referring Provider (OT): Joni Fears, MD   Encounter Date: 01/03/2019  OT End of Session - 01/03/19 7741    Visit Number  4    Number of Visits  8    Date for OT Re-Evaluation  01/10/19    Authorization Type  Medicaid    Authorization Time Period  3 visits approved 7/15-7/28/20. (Requesting 1 more visit from Medicaid on 01/03/19)    Authorization - Visit Number  3    Authorization - Number of Visits  3    OT Start Time  4239    OT Stop Time  1615    OT Time Calculation (min)  45 min    Activity Tolerance  Patient tolerated treatment well    Behavior During Therapy  WFL for tasks assessed/performed       Past Medical History:  Diagnosis Date  . Diabetes mellitus without complication ([place])   . Hypertension     Past Surgical History:  Procedure Laterality Date  . EXCISION MORTON'S NEUROMA      There were no vitals filed for this visit.  Subjective Assessment - 01/03/19 1550    Subjective   S: Nothing new.    Currently in Pain?  No/denies         [facility] OT Assessment - 01/03/19 1551      Assessment   Medical Diagnosis  left chronic shoulder pain      Precautions   Precautions  None       01/03/19 1538  Assessment  Medical Diagnosis left chronic shoulder pain  Precautions  Precautions None  AROM  Overall AROM Comments Assessed seated. IR/er abducted.  AROM Assessment Site Shoulder  Right/Left Shoulder Left  Left Shoulder Flexion 160 Degrees (previous: 150)  Left Shoulder ABduction 160 Degrees (previous: 158)  Left Shoulder Internal Rotation 20 Degrees (previous: 50)  Left Shoulder External Rotation 75 Degrees (previous: 30)  Strength  Overall Strength Comments Middle trapezius: 4/5   (All shoulder ranges are 5/5.)            OT Treatments/Exercises (OP) - 01/03/19 1551      Exercises   Exercises  Shoulder      Shoulder Exercises: Supine   Protraction  PROM;5 reps    Horizontal ABduction  PROM;5 reps;AROM;12 reps    External Rotation  PROM;5 reps    Internal Rotation  PROM;5 reps    Flexion  PROM;5 reps    ABduction  PROM;5 reps      Shoulder Exercises: Prone   Other Prone Exercises  [place] exercises; H1 10X A/ROM      Shoulder Exercises: Sidelying   External Rotation  Strengthening;10 reps    External Rotation Weight (lbs)  4    Internal Rotation  Strengthening;10 reps    Internal Rotation Weight (lbs)  4    Flexion  Strengthening;10 reps    Flexion Weight (lbs)  4    ABduction  Strengthening;10 reps    ABduction Weight (lbs)  4    Other Sidelying Exercises  Protraction; 4#; 10X    Other Sidelying Exercises  Horizontal abduction; 4#; 10X      Shoulder Exercises: Standing   Extension  Theraband;10 reps    Theraband Level (Shoulder Extension)  Level 3 (Green)  Row  Theraband;10 reps    Theraband Level (Shoulder Row)  Level 3 (Green)    Retraction  Theraband;10 reps    Theraband Level (Shoulder Retraction)  Level 3 (Green)      Shoulder Exercises: ROM/Strengthening   Ball on Wall  1' flexion 1' abduction green ball    Other ROM/Strengthening Exercises  Red band loop: Wall slide with Y arm lift off 10X; wall walks 5X up; wall clock 11, 9, 7 o'clock 10X each                OT Short Term Goals - 01/03/19 1626      OT SHORT TERM GOAL #1   Title  Patient will be educated and independent with HEP in order to faciliate his progress in therapy and allow him to participate in all daily tasks with less pain and more comfort.    Time  4    Period  Weeks    Status  Achieved    Target Date  01/10/19      OT SHORT TERM GOAL #2   Title  Patient will increase A/ROM of his LUE to WNL in order to complete reaching tasks with less  difficulty.    Time  4    Period  Weeks    Status  Achieved      OT SHORT TERM GOAL #3   Title  Patient will increase scapular and shoulder stability in his LUE by reporting a decrease in pain of approximately 5/10 or less when completing daily tasks.    Time  4    Period  Weeks    Status  On-going      OT SHORT TERM GOAL #4   Title  patient will decrease fascial restrictions to min amount or less in order to increase functiona mobility and comfort level when completing reaching tasks.    Time  4    Period  Weeks    Status  Achieved               Plan - 01/03/19 1627    Clinical Impression Statement  A: Reassessment for medicaid re-authorization completed this date to request one more visit from medicaid. patient has shown improvement with his A/ROM and fascial restrictions. He continues to have limitations with his scapular/shoulder stability and pain during lifting tasks. Next week he reports that his wife is having hip surgery and is only able to attend therapy on Wednesday. Patient was placed on the wait list as there are no openings at this time. Patient has completed 3 weeks of conservative therapy for his Left shoulder. If he is unable to return to therapy next week he has been educated and is independent with his HEP and may complete his last week at home. pt verbalized understanding.    Body Structure / Function / Physical Skills  ADL;ROM;Fascial restriction;Strength;Pain;UE functional use    Plan  P: Pt will benefit from skilled OT services to continue working on shoulder/scapular stability in order to decrease pain level during weighted lifting. Next session: last session for patient to continue to focus on mentioned deficits. If patient is unable to return next week then discharge and patient will follow up with MD to receive a MRI of LUE.    Consulted and Agree with Plan of Care  Patient       Patient will benefit from skilled therapeutic intervention in order to  improve the following deficits and impairments:   Body Structure / Function /  Physical Skills: ADL, ROM, Fascial restriction, Strength, Pain, UE functional use       Visit Diagnosis: 1. Other symptoms and signs involving the musculoskeletal system   2. Stiffness of left shoulder, not elsewhere classified   3. Chronic left shoulder pain       Problem List Patient Active Problem List   Diagnosis Date Noted  . Cardiomyopathy ([place]) 06/08/2018  . CRI (chronic renal insufficiency), stage 3 (moderate) ([place]) 06/08/2018  . Acute combined systolic (congestive) and diastolic (congestive) heart failure ([place]) 04/26/2018  . HTN (hypertension) 04/26/2018  . 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 Hypertensive urgency 04/26/2018  . Hyperlipidemia 04/26/2018  . Elevated troponin 04/26/2018   Ailene Ravel, OTR/L,CBIS  [phone]  01/03/2019, 4:32 PM  [facility] [address] [place], [place], [address] Phone: [phone]   Fax:  [phone]  Name: MAKAEL STEIN MRN: [id number] Date of Birth: 1955-12-05

## 2019-01-03 NOTE — Telephone Encounter (Signed)
Called pt., no answer. Left message for pt to return call.  

## 2019-01-04 ENCOUNTER — Telehealth: Payer: Self-pay

## 2019-01-04 NOTE — Telephone Encounter (Signed)
Called pt. No answer, left message for pt to return call.  

## 2019-01-04 NOTE — Telephone Encounter (Signed)
-----   Message from Arnoldo Lenis, MD sent at 01/03/2019  4:23 PM EDT ----- Kidney function has decreased, please verify taking lasix 40mg  bid and if so lower to 40mg  in AM and 20mg  in PM. Repeat BMET in 2 weeks.    Zandra Abts MD

## 2019-01-07 ENCOUNTER — Telehealth: Payer: Self-pay

## 2019-01-07 ENCOUNTER — Encounter (HOSPITAL_COMMUNITY): Payer: Medicaid Other | Admitting: Specialist

## 2019-01-07 DIAGNOSIS — Z79899 Other long term (current) drug therapy: Secondary | ICD-10-CM

## 2019-01-07 MED ORDER — FUROSEMIDE 40 MG PO TABS: ORAL_TABLET | ORAL | 3 refills | Status: DC

## 2019-01-07 NOTE — Telephone Encounter (Signed)
Pt confirmed lasix is 40 mg bid, will decrease to 40 mg am and 20 mg pm,I mailed lab slip to him

## 2019-01-07 NOTE — Telephone Encounter (Signed)
-----   Message from Arnoldo Lenis, MD sent at 01/03/2019  4:23 PM EDT ----- Kidney function has decreased, please verify taking lasix 40mg  bid and if so lower to 40mg  in AM and 20mg  in PM. Repeat BMET in 2 weeks.    Zandra Abts MD

## 2019-01-08 ENCOUNTER — Ambulatory Visit (HOSPITAL_COMMUNITY): Payer: Medicaid Other

## 2019-01-09 ENCOUNTER — Other Ambulatory Visit: Payer: Self-pay

## 2019-01-09 ENCOUNTER — Ambulatory Visit (HOSPITAL_COMMUNITY): Payer: Medicaid Other | Admitting: Occupational Therapy

## 2019-01-09 ENCOUNTER — Encounter (HOSPITAL_COMMUNITY): Payer: Self-pay | Admitting: Occupational Therapy

## 2019-01-09 DIAGNOSIS — M25612 Stiffness of left shoulder, not elsewhere classified: Secondary | ICD-10-CM

## 2019-01-09 DIAGNOSIS — R29898 Other symptoms and signs involving the musculoskeletal system: Secondary | ICD-10-CM | POA: Diagnosis not present

## 2019-01-09 DIAGNOSIS — G8929 Other chronic pain: Secondary | ICD-10-CM | POA: Diagnosis not present

## 2019-01-09 DIAGNOSIS — M25512 Pain in left shoulder: Secondary | ICD-10-CM

## 2019-01-09 NOTE — Patient Instructions (Signed)
Theraband strengthening: Complete 10-15X, 1-2X/day  1) Shoulder protraction  Anchor band in doorway, stand with back to door. Push your hand forward as much as you can to bringing your shoulder blades forward on your rib cage.      2) Shoulder horizontal abduction  Standing with a theraband anchored at chest height, begin with arm straight and some tension in the band. Move your arm out to your side (keeping straight the whole time). Bring the affected arm back to midline.     3) Shoulder Internal Rotation  While holding an elastic band at your side with your elbow bent, start with your hand away from your stomach, then pull the band towards your stomach. Keep your elbow near your side the entire time.     4) Shoulder External Rotation  While holding an elastic band at your side with your elbow bent, start with your hand near your stomach and then pull the band away. Keep your elbow at your side the entire time.     5) Shoulder flexion  While standing with back to the door, holding Theraband at hand level, raise arm in front of you.  Keep elbow straight through entire movement.      6) Shoulder abduction  While holding an elastic band at your side, draw up your arm to the side keeping your elbow straight.   

## 2019-01-09 NOTE — Therapy (Signed)
[facility] [place], [place], [address] Phone: [phone]   Fax:  [phone]  Occupational Therapy Treatment and Discharge Summary  Patient Details  Name: [name] MRN: [id number] Date of Birth: 06/20/1955 Referring Provider (OT): Joni Fears, MD   Encounter Date: 01/09/2019  OT End of Session - 01/09/19 1200    Visit Number  5    Number of Visits  8    Date for OT Re-Evaluation  01/10/19    Authorization Type  Medicaid    Authorization Time Period  3 visits approved 7/15-7/28/20. 1 additional visit approved 7/29-8/4    Authorization - Visit Number  1    Authorization - Number of Visits  1    OT Start Time  5027    OT Stop Time  1158    OT Time Calculation (min)  46 min    Activity Tolerance  Patient tolerated treatment well    Behavior During Therapy  [facility] for tasks assessed/performed       Past Medical History:  Diagnosis Date  . Diabetes mellitus without complication ([place])   . Hypertension     Past Surgical History:  Procedure Laterality Date  . EXCISION MORTON'S NEUROMA      There were no vitals filed for this visit.  Subjective Assessment - 01/09/19 1110    Subjective   S: Sometimes if I move it the wrong way I get a twinge.    Currently in Pain?  No/denies         [facility] OT Assessment - 01/09/19 1110      Assessment   Medical Diagnosis  left chronic shoulder pain      Precautions   Precautions  None               OT Treatments/Exercises (OP) - 01/09/19 1116      Exercises   Exercises  Shoulder      Shoulder Exercises: Supine   Protraction  PROM;5 reps    Horizontal ABduction  PROM;5 reps;AROM;12 reps    External Rotation  PROM;5 reps    Internal Rotation  PROM;5 reps    Flexion  PROM;5 reps    ABduction  PROM;5 reps      Shoulder Exercises: Sidelying   External Rotation  Strengthening;10 reps    External Rotation Weight (lbs)  4    Internal Rotation   Strengthening;10 reps    Internal Rotation Weight (lbs)  4    Flexion  Strengthening;10 reps    Flexion Weight (lbs)  4    ABduction  Strengthening;10 reps    ABduction Weight (lbs)  4    Other Sidelying Exercises  Protraction; 4#; 10X    Other Sidelying Exercises  Horizontal abduction; 4#; 10X      Shoulder Exercises: Standing   Protraction  Theraband;10 reps    Theraband Level (Shoulder Protraction)  Level 2 (Red)    Horizontal ABduction  Theraband;10 reps    Theraband Level (Shoulder Horizontal ABduction)  Level 2 (Red)    External Rotation  Theraband;10 reps    Theraband Level (Shoulder External Rotation)  Level 2 (Red)    Internal Rotation  Theraband;10 reps    Theraband Level (Shoulder Internal Rotation)  Level 2 (Red)    Flexion  Theraband;10 reps    Theraband Level (Shoulder Flexion)  Level 2 (Red)    ABduction  Theraband;10 reps    Theraband Level (Shoulder ABduction)  Level 2 (Red)  Shoulder Exercises: ROM/Strengthening   "W" Arms  10X A/ROM    X to V Arms  10X, 2#    Proximal Shoulder Strengthening, Seated  A/ROM; 12X; no rest breaks    Ball on Wall  1' flexion 1' abduction green ball    Other ROM/Strengthening Exercises  Y lift off, 10X             OT Education - 01/09/19 1122    Education Details  red theraband strengthening    Person(s) Educated  Patient    Methods  Explanation;Demonstration;Handout;Verbal cues    Comprehension  Returned demonstration;Verbalized understanding       OT Short Term Goals - 01/09/19 1202      OT SHORT TERM GOAL #1   Title  Patient will be educated and independent with HEP in order to faciliate his progress in therapy and allow him to participate in all daily tasks with less pain and more comfort.    Time  4    Period  Weeks    Status  Achieved    Target Date  01/10/19      OT SHORT TERM GOAL #2   Title  Patient will increase A/ROM of his LUE to WNL in order to complete reaching tasks with less difficulty.    Time   4    Period  Weeks    Status  Achieved      OT SHORT TERM GOAL #3   Title  Patient will increase scapular and shoulder stability in his LUE by reporting a decrease in pain of approximately 5/10 or less when completing daily tasks.    Time  4    Period  Weeks    Status  Achieved      OT SHORT TERM GOAL #4   Title  patient will decrease fascial restrictions to min amount or less in order to increase functiona mobility and comfort level when completing reaching tasks.    Time  4    Period  Weeks    Status  Achieved               Plan - 01/09/19 1201    Clinical Impression Statement  A: Pt completed final session today with focus on scapular strengthening and stability. Updated HEP for red theraband shoulder/scapular strengthening and pt completing with good form. Pt reports no instances of pain, occasional fatigue. During daily tasks has occasional "twinges" of pain with quick movements. Pt is agreeable to discharge today.    Body Structure / Function / Physical Skills  ADL;ROM;Fascial restriction;Strength;Pain;UE functional use    Plan  P: Discharge pt       Patient will benefit from skilled therapeutic intervention in order to improve the following deficits and impairments:   Body Structure / Function / Physical Skills: ADL, ROM, Fascial restriction, Strength, Pain, UE functional use       Visit Diagnosis: 1. Other symptoms and signs involving the musculoskeletal system   2. Stiffness of left shoulder, not elsewhere classified   3. Chronic left shoulder pain       Problem List Patient Active Problem List   Diagnosis Date Noted  . Cardiomyopathy ([place]) 06/08/2018  . CRI (chronic renal insufficiency), stage 3 (moderate) ([place]) 06/08/2018  . Acute combined systolic (congestive) and diastolic (congestive) heart failure ([place]) 04/26/2018  . HTN (hypertension) 04/26/2018  . 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 Hypertensive  urgency 04/26/2018  . Hyperlipidemia 04/26/2018  . Elevated troponin 04/26/2018  Guadelupe Sabin, OTR/L  [phone] 01/09/2019, 12:03 PM  [facility] [address] [place], [place], [address] Phone: [phone]   Fax:  [phone]  Name: [name] MRN: [id number] Date of Birth: 07/03/1955   OCCUPATIONAL THERAPY DISCHARGE SUMMARY  Visits from Start of Care: 4  Current functional level related to goals / functional outcomes: Pt has met all goals and is reporting improvement in use of shoulder during ADL tasks. Pt with ROM and strength WFL, pain level has decreased during daily tasks.    Remaining deficits: Tightness during IR behind back, occasional discomfort and low level pain during quick movements or sustained use/lifting   Education / Equipment: HEP for shoulder and scapular stability Plan: Patient agrees to discharge.  Patient goals were met. Patient is being discharged due to meeting the stated rehab goals.  ?????

## 2019-01-10 ENCOUNTER — Ambulatory Visit (HOSPITAL_COMMUNITY): Payer: Medicaid Other

## 2019-01-11 ENCOUNTER — Other Ambulatory Visit: Payer: Self-pay

## 2019-01-11 ENCOUNTER — Encounter: Payer: Self-pay | Admitting: Family Medicine

## 2019-01-11 ENCOUNTER — Ambulatory Visit (INDEPENDENT_AMBULATORY_CARE_PROVIDER_SITE_OTHER): Payer: Medicaid Other | Admitting: Family Medicine

## 2019-01-11 VITALS — Temp 97.8°F | Wt 216.6 lb

## 2019-01-11 DIAGNOSIS — G4730 Sleep apnea, unspecified: Secondary | ICD-10-CM | POA: Diagnosis not present

## 2019-01-11 DIAGNOSIS — E1122 Type 2 diabetes mellitus with diabetic chronic kidney disease: Secondary | ICD-10-CM | POA: Diagnosis not present

## 2019-01-11 DIAGNOSIS — N183 Chronic kidney disease, stage 3 unspecified: Secondary | ICD-10-CM

## 2019-01-11 MED ORDER — FUROSEMIDE 40 MG PO TABS: ORAL_TABLET | ORAL | 3 refills | Status: DC

## 2019-01-11 NOTE — Progress Notes (Signed)
   Subjective:    Patient ID: [name], male    DOB: [birth year], 63 y.o.   MRN: [id number]  HPI Pt here today for discussion on labs. Pt glucose elevated. Pt has lots of questions. Pt states that his glucose yesterday morning was 150 after eating.  This patient has some pretty complex issues he has underlying cardiomyopathy.  Has worsening kidney function.  And worsening diabetes.  Patient also states does not want to be on injectables. Poorly controlled diabetes does try to watch his diet takes his medications currently check sugars occasionally  History of cardiomyopathy with CHF denies any shortness of breath or swelling in the legs currently no PND orthopnea.  Does see cardiology on a regular basis  Worsening kidney function probably related to his diabetes as well as hypertension does try to watch diet and stay physically active.  Review of Systems  Constitutional: Negative for diaphoresis and fatigue.  HENT: Negative for congestion and rhinorrhea.   Respiratory: Negative for cough and shortness of breath.   Cardiovascular: Negative for chest pain and leg swelling.  Gastrointestinal: Negative for abdominal pain and diarrhea.  Endocrine: Positive for polyuria. Negative for polydipsia and polyphagia.  Skin: Negative for color change and rash.  Neurological: Negative for dizziness and headaches.  Psychiatric/Behavioral: Negative for behavioral problems and confusion.       Objective:   Physical Exam Vitals signs reviewed.  Constitutional:      General: He is not in acute distress. HENT:     Head: Normocephalic and atraumatic.  Eyes:     General:        Right eye: No discharge.        Left eye: No discharge.  Neck:     Trachea: No tracheal deviation.  Cardiovascular:     Rate and Rhythm: Normal rate and regular rhythm.     Heart sounds: Normal heart sounds. No murmur.  Pulmonary:     Effort: Pulmonary effort is normal. No respiratory distress.     Breath sounds:  Normal breath sounds. No wheezing.  Lymphadenopathy:     Cervical: No cervical adenopathy.  Skin:    General: Skin is warm.     Findings: No rash.  Neurological:     Mental Status: He is alert.  Psychiatric:        Behavior: Behavior normal.           Assessment & Plan:  Chronic kidney disease Follow-up on metabolic 7 when he is well-hydrated May be eligible for Januvia Based on creatinine may need to refer him to nephrology  Hypertension decent control continue current measures watch salt diet  Chronic kidney disease stay away from all anti-inflammatories  Hyperlipidemia continue current measures  Watch diet  Diabetes subpar control may need to add Januvia but this is based upon kidney function  25 minutes was spent with the patient.  This statement verifies that 25 minutes was indeed spent with the patient.  More than 50% of this visit-total duration of the visit-was spent in counseling and coordination of care. The issues that the patient came in for today as reflected in the diagnosis (s) please refer to documentation for further details.  Significant proteinuria associated with kidney disease and diabetes  Probable sleep apnea referral for sleep study

## 2019-01-18 DIAGNOSIS — H25813 Combined forms of age-related cataract, bilateral: Secondary | ICD-10-CM | POA: Diagnosis not present

## 2019-01-18 DIAGNOSIS — E113532 Type 2 diabetes mellitus with proliferative diabetic retinopathy with traction retinal detachment not involving the macula, left eye: Secondary | ICD-10-CM | POA: Diagnosis not present

## 2019-01-18 DIAGNOSIS — H4313 Vitreous hemorrhage, bilateral: Secondary | ICD-10-CM | POA: Diagnosis not present

## 2019-01-18 DIAGNOSIS — Z7984 Long term (current) use of oral hypoglycemic drugs: Secondary | ICD-10-CM | POA: Diagnosis not present

## 2019-01-18 DIAGNOSIS — E113591 Type 2 diabetes mellitus with proliferative diabetic retinopathy without macular edema, right eye: Secondary | ICD-10-CM | POA: Diagnosis not present

## 2019-01-31 ENCOUNTER — Encounter (INDEPENDENT_AMBULATORY_CARE_PROVIDER_SITE_OTHER): Payer: Self-pay | Admitting: *Deleted

## 2019-02-04 ENCOUNTER — Other Ambulatory Visit (INDEPENDENT_AMBULATORY_CARE_PROVIDER_SITE_OTHER): Payer: Self-pay | Admitting: *Deleted

## 2019-02-04 DIAGNOSIS — Z1211 Encounter for screening for malignant neoplasm of colon: Secondary | ICD-10-CM

## 2019-02-04 DIAGNOSIS — Z800 Family history of malignant neoplasm of digestive organs: Secondary | ICD-10-CM | POA: Insufficient documentation

## 2019-02-20 DIAGNOSIS — Z7984 Long term (current) use of oral hypoglycemic drugs: Secondary | ICD-10-CM | POA: Diagnosis not present

## 2019-02-20 DIAGNOSIS — E113532 Type 2 diabetes mellitus with proliferative diabetic retinopathy with traction retinal detachment not involving the macula, left eye: Secondary | ICD-10-CM | POA: Diagnosis not present

## 2019-02-20 DIAGNOSIS — H4313 Vitreous hemorrhage, bilateral: Secondary | ICD-10-CM | POA: Diagnosis not present

## 2019-02-20 DIAGNOSIS — Z9889 Other specified postprocedural states: Secondary | ICD-10-CM | POA: Diagnosis not present

## 2019-02-20 DIAGNOSIS — H25813 Combined forms of age-related cataract, bilateral: Secondary | ICD-10-CM | POA: Diagnosis not present

## 2019-02-20 DIAGNOSIS — Z8669 Personal history of other diseases of the nervous system and sense organs: Secondary | ICD-10-CM | POA: Diagnosis not present

## 2019-02-20 DIAGNOSIS — E113511 Type 2 diabetes mellitus with proliferative diabetic retinopathy with macular edema, right eye: Secondary | ICD-10-CM | POA: Diagnosis not present

## 2019-03-01 DIAGNOSIS — Z79899 Other long term (current) drug therapy: Secondary | ICD-10-CM | POA: Diagnosis not present

## 2019-03-02 LAB — BASIC METABOLIC PANEL
BUN/Creatinine Ratio: 18 (ref 10–24)
BUN: 34 mg/dL — ABNORMAL HIGH (ref 8–27)
CO2: 21 mmol/L (ref 20–29)
Calcium: 9.8 mg/dL (ref 8.6–10.2)
Chloride: 106 mmol/L (ref 96–106)
Creatinine, Ser: 1.94 mg/dL — ABNORMAL HIGH (ref 0.76–1.27)
GFR calc Af Amer: 42 mL/min/{1.73_m2} — ABNORMAL LOW (ref >59)
GFR calc non Af Amer: 36 mL/min/{1.73_m2} — ABNORMAL LOW (ref >59)
Glucose: 141 mg/dL — ABNORMAL HIGH (ref 65–99)
Potassium: 3.9 mmol/L (ref 3.5–5.2)
Sodium: 140 mmol/L (ref 134–144)

## 2019-03-03 DIAGNOSIS — I2510 Atherosclerotic heart disease of native coronary artery without angina pectoris: Secondary | ICD-10-CM

## 2019-03-03 HISTORY — DX: Atherosclerotic heart disease of native coronary artery without angina pectoris: I25.10

## 2019-03-06 ENCOUNTER — Encounter: Payer: Self-pay | Admitting: Cardiovascular Disease

## 2019-03-06 ENCOUNTER — Other Ambulatory Visit: Payer: Self-pay

## 2019-03-06 ENCOUNTER — Ambulatory Visit (INDEPENDENT_AMBULATORY_CARE_PROVIDER_SITE_OTHER): Payer: Medicaid Other | Admitting: Cardiovascular Disease

## 2019-03-06 ENCOUNTER — Other Ambulatory Visit (HOSPITAL_COMMUNITY)
Admission: RE | Admit: 2019-03-06 | Discharge: 2019-03-06 | Disposition: A | Discharge status: Home / Self Care | Payer: Medicaid Other | Source: Ambulatory Visit | Attending: Cardiovascular Disease | Admitting: Cardiovascular Disease

## 2019-03-06 ENCOUNTER — Other Ambulatory Visit: Payer: Self-pay | Admitting: Cardiovascular Disease

## 2019-03-06 VITALS — BP 170/93 | HR 78 | Temp 97.3°F | Ht 69.0 in | Wt 217.0 lb

## 2019-03-06 DIAGNOSIS — I10 Essential (primary) hypertension: Secondary | ICD-10-CM

## 2019-03-06 DIAGNOSIS — E785 Hyperlipidemia, unspecified: Secondary | ICD-10-CM | POA: Diagnosis not present

## 2019-03-06 DIAGNOSIS — R069 Unspecified abnormalities of breathing: Secondary | ICD-10-CM | POA: Insufficient documentation

## 2019-03-06 DIAGNOSIS — Z79899 Other long term (current) drug therapy: Secondary | ICD-10-CM | POA: Diagnosis not present

## 2019-03-06 DIAGNOSIS — I25118 Atherosclerotic heart disease of native coronary artery with other forms of angina pectoris: Secondary | ICD-10-CM | POA: Diagnosis not present

## 2019-03-06 DIAGNOSIS — N183 Chronic kidney disease, stage 3 unspecified: Secondary | ICD-10-CM

## 2019-03-06 DIAGNOSIS — I5042 Chronic combined systolic (congestive) and diastolic (congestive) heart failure: Secondary | ICD-10-CM

## 2019-03-06 DIAGNOSIS — R0609 Other forms of dyspnea: Secondary | ICD-10-CM | POA: Diagnosis not present

## 2019-03-06 DIAGNOSIS — I429 Cardiomyopathy, unspecified: Secondary | ICD-10-CM | POA: Diagnosis not present

## 2019-03-06 DIAGNOSIS — R0600 Dyspnea, unspecified: Secondary | ICD-10-CM

## 2019-03-06 LAB — BASIC METABOLIC PANEL WITH GFR
Anion gap: 7 (ref 5–15)
BUN: 26 mg/dL — ABNORMAL HIGH (ref 8–23)
CO2: 25 mmol/L (ref 22–32)
Calcium: 9.1 mg/dL (ref 8.9–10.3)
Chloride: 107 mmol/L (ref 98–111)
Creatinine, Ser: 1.74 mg/dL — ABNORMAL HIGH (ref 0.61–1.24)
GFR calc Af Amer: 48 mL/min — ABNORMAL LOW
GFR calc non Af Amer: 41 mL/min — ABNORMAL LOW
Glucose, Bld: 194 mg/dL — ABNORMAL HIGH (ref 70–99)
Potassium: 3.8 mmol/L (ref 3.5–5.1)
Sodium: 139 mmol/L (ref 135–145)

## 2019-03-06 LAB — CBC WITH DIFFERENTIAL/PLATELET
Abs Immature Granulocytes: 0.02 K/uL (ref 0.00–0.07)
Basophils Absolute: 0.1 K/uL (ref 0.0–0.1)
Basophils Relative: 1 %
Eosinophils Absolute: 0.3 K/uL (ref 0.0–0.5)
Eosinophils Relative: 4 %
HCT: 37 % — ABNORMAL LOW (ref 39.0–52.0)
Hemoglobin: 12.1 g/dL — ABNORMAL LOW (ref 13.0–17.0)
Immature Granulocytes: 0 %
Lymphocytes Relative: 19 %
Lymphs Abs: 1.6 K/uL (ref 0.7–4.0)
MCH: 28.1 pg (ref 26.0–34.0)
MCHC: 32.7 g/dL (ref 30.0–36.0)
MCV: 86 fL (ref 80.0–100.0)
Monocytes Absolute: 0.6 K/uL (ref 0.1–1.0)
Monocytes Relative: 7 %
Neutro Abs: 5.7 K/uL (ref 1.7–7.7)
Neutrophils Relative %: 69 %
Platelets: 248 K/uL (ref 150–400)
RBC: 4.3 MIL/uL (ref 4.22–5.81)
RDW: 13.8 % (ref 11.5–15.5)
WBC: 8.2 K/uL (ref 4.0–10.5)
nRBC: 0 % (ref 0.0–0.2)

## 2019-03-06 MED ORDER — HYDRALAZINE HCL 50 MG PO TABS: 50.0000 mg | ORAL_TABLET | Freq: Three times a day (TID) | ORAL | 3 refills | Status: DC

## 2019-03-06 NOTE — H&P (View-Only) (Signed)
SUBJECTIVE: The patient presents for routine follow-up.  He had Lasix decreased to 40 mg every morning and 20 mg every evening in late July due to worsening renal function.  He has a history of presumed CAD and ischemic cardiomyopathy/chronic systolic heart failure.  For the past several months he has been experiencing increasing exertional dyspnea.  He was short of breath when walking from his car to our office.  He said he has no desire to do anything.  He denies chest pain per se.  He has a cough after about lying down for an hour.  Systolic blood pressures have been in the 170 range at home.  He has had 3 right eye surgeries.  He denies leg swelling, orthopnea, and paroxysmal nocturnal dyspnea.  He is to take 1500 mg of ibuprofen daily for several years due to arthritis.  Hemoglobin A1c 8.1% on 11/13/2018.    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 He is originally from [place] but moved here at the age of 70. His father was a Garment/textile technologist.  Review of Systems: As per "subjective", otherwise negative.  Allergies  Allergen Reactions  . Bee Venom Anaphylaxis    Current Outpatient Medications  Medication Sig Dispense Refill  . aspirin 81 MG chewable tablet Chew 1 tablet (81 mg total) by mouth daily. 30 tablet 0  . carvedilol (COREG) 12.5 MG tablet TAKE 1 TABLET BY MOUTH TWICE A DAY WITH A MEAL 50 tablet 3  . furosemide (LASIX) 40 MG tablet Take 40 mg am 90 tablet 3  . glipiZIDE (GLUCOTROL) 10 MG tablet TAKE ONE TABLET BY MOUTH TWICE A DAY 180 tablet 0  . hydrALAZINE (APRESOLINE) 25 MG tablet Take 3 tablets bid 540 tablet 1  . lisinopril (PRINIVIL,ZESTRIL) 10 MG tablet TAKE ONE (1) TABLET BY MOUTH EVERY DAY 30 tablet 1  . nitroGLYCERIN (NITROSTAT) 0.4 MG SL tablet Place 1 tablet (0.4 mg total) under the tongue every 5 (five) minutes as  needed. 25 tablet 3  . rosuvastatin (CRESTOR) 5 MG tablet Take 1 tablet (5 mg total) by mouth daily. 90 tablet 3   No current facility-administered medications for this visit.     Past Medical History:  Diagnosis Date  . Diabetes mellitus without complication ([place])   . Hypertension     Past Surgical History:  Procedure Laterality Date  . EXCISION MORTON'S NEUROMA      Social History   Socioeconomic History  . Marital status: Single    Spouse name: Not on file  . Number of children: Not on file  . Years of education: Not on file  . Highest education level: Not on file  Occupational History  . Not on file  Social Needs  . Financial resource strain: Not on file  . Food insecurity    Worry: Not on file    Inability: Not on file  . Transportation needs    Medical: Not on file    Non-medical: Not on file  Tobacco Use  . Smoking status: Former Smoker    Quit date: 07/06/1988    Years since quitting: 30.6  . Smokeless tobacco: Never Used  Substance and Sexual Activity  . Alcohol use: Not Currently    Comment: socially  . Drug use: No  . Sexual activity: Not on file  Lifestyle  . Physical activity  Days per week: Not on file    Minutes per session: Not on file  . Stress: Not on file  Relationships  . Social Herbalist on phone: Not on file    Gets together: Not on file    Attends religious service: Not on file    Active member of club or organization: Not on file    Attends meetings of clubs or organizations: Not on file    Relationship status: Not on file  . Intimate partner violence    Fear of current or ex partner: Not on file    Emotionally abused: Not on file    Physically abused: Not on file    Forced sexual activity: Not on file  Other Topics Concern  . Not on file  Social History Narrative  . Not on file     Vitals:   03/06/19 0848  BP: (!) 170/93  Pulse: 78  Temp: (!) 97.3 F (36.3 C)  Weight: 217 lb (98.4 kg)  Height: 5\' 9"   (1.753 m)    Wt Readings from Last 3 Encounters:  03/06/19 217 lb (98.4 kg)  01/11/19 216 lb 9.6 oz (98.2 kg)  11/21/18 225 lb (102.1 kg)     PHYSICAL EXAM General: NAD HEENT: Normal. Neck: No JVD, no thyromegaly. Lungs: Clear to auscultation bilaterally with normal respiratory effort. CV: Regular rate and rhythm, normal S1/S2, no S3/S4, no murmur. No pretibial or periankle edema.  No carotid bruit.   Abdomen: Soft, nontender, no distention.  Neurologic: Alert and oriented.  Psych: Normal affect. Skin: Normal. Musculoskeletal: No gross deformities.      Labs: Lab Results  Component Value Date/Time   K 3.9 03/01/2019 08:41 AM   BUN 34 (H) 03/01/2019 08:41 AM   CREATININE 1.94 (H) 03/01/2019 08:41 AM   TSH 2.110 04/29/2018 09:00 AM   HGB 13.5 04/26/2018 08:00 AM     Lipids: Lab Results  Component Value Date/Time   LDLCALC 77 12/31/2018 09:58 AM   CHOL 152 12/31/2018 09:58 AM   TRIG 172 (H) 12/31/2018 09:58 AM   HDL 41 12/31/2018 09:58 AM      Nuclear stress test on 06/20/2018 demonstrated prior myocardial infarction with no significant ischemic territories. The defect involved the inferior, inferoseptal, and inferoapical walls. LVEF 43%.  Echocardiogram 04/26/18:  Study Conclusions  - Left ventricle: The cavity size was normal. There was minimal concentric hypertrophy. Systolic function was mildly reduced. The estimated ejection fraction was in the range of 45% to 50%. Diffuse hypokinesis. Findings consistent with left ventricular diastolic dysfunction, grade indeterminate. Doppler parameters are consistent with high ventricular filling pressure. - Mitral valve: Mildly calcified annulus. There was mild regurgitation. - Left atrium: The atrium was severely dilated. - Right atrium: The atrium was mildly dilated. - Inferior vena cava: The vessel was dilated. The respirophasic diameter changes were blunted (<50%), consistent with elevated  central venous pressure. Estimated CVP 15 mmHg.    ASSESSMENT AND PLAN:  1. Troponin elevation/abnormal nuclear stress test/presumed CAD/progressive exertional dyspnea: Nuclear stress test reviewed above with evidence of prior inferior myocardial infarction with no current ischemia. Currently on aspirin, statin, and carvedilol.He has had progressive exertional dyspnea and fatigue over the last several months.  I am concerned about hemodynamically significant coronary artery disease. I spoke to him at length about this and we have agreed to proceed with right and left heart catheterization and coronary angiography.  He will need IV fluids before hand to mitigate the risk of  contrast mediated nephropathy given his stage III chronic kidney disease.  I will also check a CBC.  I will aim to control blood pressure. Risks and benefits of cardiac catheterization have been discussed with the patient.  These include bleeding, infection, kidney damage, stroke, heart attack, death.  The patient understands these risks and is willing to proceed.   2. Chronic combined systolic and diastolic heart failure: LVEF 45 to 50% with grade 1 diastolic dysfunction by most recent echocardiogram. Currently on carvedilol, lisinopril, hydralazine, and Lasix 40 mg daily.Euvolemic and stable.  He has NYHA class III symptoms.  I will arrange for cardiac catheterization as detailed in #1. Given CKD stage III I will stop lisinopril.  3. Hypertension:Markedly elevated.  I will increase hydralazine to 50 mg 3 times daily.  I am stopping lisinopril due to CKD stage III  4. Chronic kidney disease stage III: BUN 34 and creatinine 1.94 on 03/01/2019.  I will stop lisinopril.  He took ibuprofen 1500 mg daily for several years due to arthritis.  5. Hyperlipidemia: Lipids reviewed above.  Continue rosuvastatin 5 mg.   Disposition: Follow up after cath  A high level of decision making was required for increased medical  complexities.    [name], M.D., F.A.C.C.

## 2019-03-06 NOTE — Patient Instructions (Signed)
Medication Instructions:  STOP LISINOPRIL   INCREASE HYDRALAZINE TO 50 MG - THREE TIMES DAILY   Labwork: CBC BMET   Testing/Procedures: Your physician has requested that you have a cardiac catheterization. Cardiac catheterization is used to diagnose and/or treat various heart conditions. Doctors may recommend this procedure for a number of different reasons. The most common reason is to evaluate chest pain. Chest pain can be a symptom of coronary artery disease (CAD), and cardiac catheterization can show whether plaque is narrowing or blocking your heart's arteries. This procedure is also used to evaluate the valves, as well as measure the blood flow and oxygen levels in different parts of your heart. For further information please visit [email or web address]. Please follow instruction sheet, as given.   Follow-Up: Your physician recommends that you schedule a follow-up appointment in: 1 MONTH AFTER CATH    Any Other Special Instructions Will Be Listed Below (If Applicable).     If you need a refill on your cardiac medications before your next appointment, please call your pharmacy.

## 2019-03-06 NOTE — Progress Notes (Signed)
SUBJECTIVE: The patient presents for routine follow-up.  He had Lasix decreased to 40 mg every morning and 20 mg every evening in late July due to worsening renal function.  He has a history of presumed CAD and ischemic cardiomyopathy/chronic systolic heart failure.  For the past several months he has been experiencing increasing exertional dyspnea.  He was short of breath when walking from his car to our office.  He said he has no desire to do anything.  He denies chest pain per se.  He has a cough after about lying down for an hour.  Systolic blood pressures have been in the 170 range at home.  He has had 3 right eye surgeries.  He denies leg swelling, orthopnea, and paroxysmal nocturnal dyspnea.  He is to take 1500 mg of ibuprofen daily for several years due to arthritis.  Hemoglobin A1c 8.1% on 11/13/2018.    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 He is originally from [place] but moved here at the age of 69. His father was a Garment/textile technologist.  Review of Systems: As per "subjective", otherwise negative.  Allergies  Allergen Reactions  . Bee Venom Anaphylaxis    Current Outpatient Medications  Medication Sig Dispense Refill  . aspirin 81 MG chewable tablet Chew 1 tablet (81 mg total) by mouth daily. 30 tablet 0  . carvedilol (COREG) 12.5 MG tablet TAKE 1 TABLET BY MOUTH TWICE A DAY WITH A MEAL 50 tablet 3  . furosemide (LASIX) 40 MG tablet Take 40 mg am 90 tablet 3  . glipiZIDE (GLUCOTROL) 10 MG tablet TAKE ONE TABLET BY MOUTH TWICE A DAY 180 tablet 0  . hydrALAZINE (APRESOLINE) 25 MG tablet Take 3 tablets bid 540 tablet 1  . lisinopril (PRINIVIL,ZESTRIL) 10 MG tablet TAKE ONE (1) TABLET BY MOUTH EVERY DAY 30 tablet 1  . nitroGLYCERIN (NITROSTAT) 0.4 MG SL tablet Place 1 tablet (0.4 mg total) under the tongue every 5 (five) minutes as  needed. 25 tablet 3  . rosuvastatin (CRESTOR) 5 MG tablet Take 1 tablet (5 mg total) by mouth daily. 90 tablet 3   No current facility-administered medications for this visit.     Past Medical History:  Diagnosis Date  . Diabetes mellitus without complication ([place])   . Hypertension     Past Surgical History:  Procedure Laterality Date  . EXCISION MORTON'S NEUROMA      Social History   Socioeconomic History  . Marital status: Single    Spouse name: Not on file  . Number of children: Not on file  . Years of education: Not on file  . Highest education level: Not on file  Occupational History  . Not on file  Social Needs  . Financial resource strain: Not on file  . Food insecurity    Worry: Not on file    Inability: Not on file  . Transportation needs    Medical: Not on file    Non-medical: Not on file  Tobacco Use  . Smoking status: Former Smoker    Quit date: 07/06/1988    Years since quitting: 30.6  . Smokeless tobacco: Never Used  Substance and Sexual Activity  . Alcohol use: Not Currently    Comment: socially  . Drug use: No  . Sexual activity: Not on file  Lifestyle  . Physical activity  Days per week: Not on file    Minutes per session: Not on file  . Stress: Not on file  Relationships  . Social Herbalist on phone: Not on file    Gets together: Not on file    Attends religious service: Not on file    Active member of club or organization: Not on file    Attends meetings of clubs or organizations: Not on file    Relationship status: Not on file  . Intimate partner violence    Fear of current or ex partner: Not on file    Emotionally abused: Not on file    Physically abused: Not on file    Forced sexual activity: Not on file  Other Topics Concern  . Not on file  Social History Narrative  . Not on file     Vitals:   03/06/19 0848  BP: (!) 170/93  Pulse: 78  Temp: (!) 97.3 F (36.3 C)  Weight: 217 lb (98.4 kg)  Height: 5\' 9"   (1.753 m)    Wt Readings from Last 3 Encounters:  03/06/19 217 lb (98.4 kg)  01/11/19 216 lb 9.6 oz (98.2 kg)  11/21/18 225 lb (102.1 kg)     PHYSICAL EXAM General: NAD HEENT: Normal. Neck: No JVD, no thyromegaly. Lungs: Clear to auscultation bilaterally with normal respiratory effort. CV: Regular rate and rhythm, normal S1/S2, no S3/S4, no murmur. No pretibial or periankle edema.  No carotid bruit.   Abdomen: Soft, nontender, no distention.  Neurologic: Alert and oriented.  Psych: Normal affect. Skin: Normal. Musculoskeletal: No gross deformities.      Labs: Lab Results  Component Value Date/Time   K 3.9 03/01/2019 08:41 AM   BUN 34 (H) 03/01/2019 08:41 AM   CREATININE 1.94 (H) 03/01/2019 08:41 AM   TSH 2.110 04/29/2018 09:00 AM   HGB 13.5 04/26/2018 08:00 AM     Lipids: Lab Results  Component Value Date/Time   LDLCALC 77 12/31/2018 09:58 AM   CHOL 152 12/31/2018 09:58 AM   TRIG 172 (H) 12/31/2018 09:58 AM   HDL 41 12/31/2018 09:58 AM      Nuclear stress test on 06/20/2018 demonstrated prior myocardial infarction with no significant ischemic territories. The defect involved the inferior, inferoseptal, and inferoapical walls. LVEF 43%.  Echocardiogram 04/26/18:  Study Conclusions  - Left ventricle: The cavity size was normal. There was minimal concentric hypertrophy. Systolic function was mildly reduced. The estimated ejection fraction was in the range of 45% to 50%. Diffuse hypokinesis. Findings consistent with left ventricular diastolic dysfunction, grade indeterminate. Doppler parameters are consistent with high ventricular filling pressure. - Mitral valve: Mildly calcified annulus. There was mild regurgitation. - Left atrium: The atrium was severely dilated. - Right atrium: The atrium was mildly dilated. - Inferior vena cava: The vessel was dilated. The respirophasic diameter changes were blunted (<50%), consistent with elevated  central venous pressure. Estimated CVP 15 mmHg.    ASSESSMENT AND PLAN:  1. Troponin elevation/abnormal nuclear stress test/presumed CAD/progressive exertional dyspnea: Nuclear stress test reviewed above with evidence of prior inferior myocardial infarction with no current ischemia. Currently on aspirin, statin, and carvedilol.He has had progressive exertional dyspnea and fatigue over the last several months.  I am concerned about hemodynamically significant coronary artery disease. I spoke to him at length about this and we have agreed to proceed with right and left heart catheterization and coronary angiography.  He will need IV fluids before hand to mitigate the risk of  contrast mediated nephropathy given his stage III chronic kidney disease.  I will also check a CBC.  I will aim to control blood pressure. Risks and benefits of cardiac catheterization have been discussed with the patient.  These include bleeding, infection, kidney damage, stroke, heart attack, death.  The patient understands these risks and is willing to proceed.   2. Chronic combined systolic and diastolic heart failure: LVEF 45 to 50% with grade 1 diastolic dysfunction by most recent echocardiogram. Currently on carvedilol, lisinopril, hydralazine, and Lasix 40 mg daily.Euvolemic and stable.  He has NYHA class III symptoms.  I will arrange for cardiac catheterization as detailed in #1. Given CKD stage III I will stop lisinopril.  3. Hypertension:Markedly elevated.  I will increase hydralazine to 50 mg 3 times daily.  I am stopping lisinopril due to CKD stage III  4. Chronic kidney disease stage III: BUN 34 and creatinine 1.94 on 03/01/2019.  I will stop lisinopril.  He took ibuprofen 1500 mg daily for several years due to arthritis.  5. Hyperlipidemia: Lipids reviewed above.  Continue rosuvastatin 5 mg.   Disposition: Follow up after cath  A high level of decision making was required for increased medical  complexities.    [name], M.D., F.A.C.C.

## 2019-03-08 ENCOUNTER — Other Ambulatory Visit (HOSPITAL_COMMUNITY)
Admission: RE | Admit: 2019-03-08 | Discharge: 2019-03-08 | Disposition: A | Discharge status: Home / Self Care | Payer: Medicaid Other | Source: Ambulatory Visit | Attending: Interventional Cardiology | Admitting: Interventional Cardiology

## 2019-03-08 ENCOUNTER — Other Ambulatory Visit: Payer: Self-pay

## 2019-03-08 DIAGNOSIS — Z01812 Encounter for preprocedural laboratory examination: Secondary | ICD-10-CM | POA: Insufficient documentation

## 2019-03-08 DIAGNOSIS — Z20828 Contact with and (suspected) exposure to other viral communicable diseases: Secondary | ICD-10-CM | POA: Insufficient documentation

## 2019-03-08 DIAGNOSIS — Z20822 Contact with and (suspected) exposure to covid-19: Secondary | ICD-10-CM

## 2019-03-08 LAB — SARS CORONAVIRUS 2 (TAT 6-24 HRS): SARS Coronavirus 2: NEGATIVE

## 2019-03-11 ENCOUNTER — Telehealth: Payer: Self-pay | Admitting: *Deleted

## 2019-03-11 NOTE — Telephone Encounter (Deleted)
Pt contacted pre-catheterization scheduled at [facility] for: Verified arrival time and place: [facility] Entrance A ([place]) at:   No solid food after midnight prior to cath, clear liquids until 5 AM day of procedure. Contrast allergy: Verified no diabetes medications.  AM meds can be  taken pre-cath with sip of water including: ASA 81 mg   Confirmed patient has responsible person to drive home post procedure and observe 24 hours after arriving home:   Currently, due to Covid-19 pandemic, only one support person will be allowed with patient. Must be the same support person for that patient's entire stay, will be screened and required to wear a mask. They will be asked to wait in the waiting room for the duration of the patient's stay.  Patients are required to wear a mask when they enter the hospital.   

## 2019-03-11 NOTE — Telephone Encounter (Signed)
Pt contacted pre-catheterization scheduled at [facility] for: Tuesday March 12, 2019 12 noon Verified arrival time and place: [facility]) at: 7 AM -pre procedure hydration   No solid food after midnight prior to cath, clear liquids until 5 AM day of procedure. Contrast allergy: no  Hold: Lasix-day before and day of procedure-GFR 41-pt had already taken today Glipizide-AM of procedure.  Except hold medications AM meds can be  taken pre-cath with sip of water including: ASA 81 mg   Confirmed patient has responsible person to drive home post procedure and observe 24 hours after arriving home: yes  Currently, due to Covid-19 pandemic, only one support person will be allowed with patient. Must be the same support person for that patient's entire stay, will be screened and required to wear a mask. They will be asked to wait in the waiting room for the duration of the patient's stay.  Patients are required to wear a mask when they enter the hospital.     COVID-19 Pre-Screening Questions:  . In the past 7 to 10 days have you had a cough,  shortness of breath, headache, congestion, fever (100 or greater) body aches, chills, sore throat, or sudden loss of taste or sense of smell? no . Have you been around anyone with known Covid 19? no . Have you been around anyone who is awaiting Covid 19 test results in the past 7 to 10 days? no . Have you been around anyone who has been exposed to Covid 19, or has mentioned symptoms of Covid 19 within the past 7 to 10 days? no    I reviewed procedure/mask/visitor instructions, Covid-19 screening questions with patient, he verbalized understanding, thanked me for call.

## 2019-03-12 ENCOUNTER — Ambulatory Visit (HOSPITAL_COMMUNITY)
Admission: RE | Disposition: A | Discharge status: Home / Self Care | Payer: Medicaid Other | Source: Home / Self Care | Attending: Interventional Cardiology

## 2019-03-12 ENCOUNTER — Ambulatory Visit (HOSPITAL_COMMUNITY)
Admission: RE | Admit: 2019-03-12 | Discharge: 2019-03-12 | Disposition: A | Discharge status: Home / Self Care | Payer: Medicaid Other | Attending: Interventional Cardiology | Admitting: Interventional Cardiology

## 2019-03-12 ENCOUNTER — Other Ambulatory Visit: Payer: Self-pay

## 2019-03-12 DIAGNOSIS — N183 Chronic kidney disease, stage 3 (moderate): Secondary | ICD-10-CM | POA: Diagnosis not present

## 2019-03-12 DIAGNOSIS — I5042 Chronic combined systolic (congestive) and diastolic (congestive) heart failure: Secondary | ICD-10-CM | POA: Diagnosis not present

## 2019-03-12 DIAGNOSIS — I2510 Atherosclerotic heart disease of native coronary artery without angina pectoris: Secondary | ICD-10-CM | POA: Insufficient documentation

## 2019-03-12 DIAGNOSIS — I25118 Atherosclerotic heart disease of native coronary artery with other forms of angina pectoris: Secondary | ICD-10-CM

## 2019-03-12 DIAGNOSIS — Z7982 Long term (current) use of aspirin: Secondary | ICD-10-CM | POA: Insufficient documentation

## 2019-03-12 DIAGNOSIS — Z79899 Other long term (current) drug therapy: Secondary | ICD-10-CM | POA: Insufficient documentation

## 2019-03-12 DIAGNOSIS — E785 Hyperlipidemia, unspecified: Secondary | ICD-10-CM | POA: Diagnosis not present

## 2019-03-12 DIAGNOSIS — Z7984 Long term (current) use of oral hypoglycemic drugs: Secondary | ICD-10-CM | POA: Insufficient documentation

## 2019-03-12 DIAGNOSIS — I130 Hypertensive heart and chronic kidney disease with heart failure and stage 1 through stage 4 chronic kidney disease, or unspecified chronic kidney disease: Secondary | ICD-10-CM | POA: Insufficient documentation

## 2019-03-12 DIAGNOSIS — E1122 Type 2 diabetes mellitus with diabetic chronic kidney disease: Secondary | ICD-10-CM | POA: Insufficient documentation

## 2019-03-12 DIAGNOSIS — Z87891 Personal history of nicotine dependence: Secondary | ICD-10-CM | POA: Diagnosis not present

## 2019-03-12 DIAGNOSIS — R0609 Other forms of dyspnea: Secondary | ICD-10-CM | POA: Insufficient documentation

## 2019-03-12 DIAGNOSIS — R9439 Abnormal result of other cardiovascular function study: Secondary | ICD-10-CM | POA: Diagnosis not present

## 2019-03-12 HISTORY — PX: RIGHT/LEFT HEART CATH AND CORONARY ANGIOGRAPHY: CATH118266

## 2019-03-12 LAB — BASIC METABOLIC PANEL
Anion gap: 12 (ref 5–15)
BUN: 29 mg/dL — ABNORMAL HIGH (ref 8–23)
CO2: 22 mmol/L (ref 22–32)
Calcium: 9.7 mg/dL (ref 8.9–10.3)
Chloride: 103 mmol/L (ref 98–111)
Creatinine, Ser: 2.03 mg/dL — ABNORMAL HIGH (ref 0.61–1.24)
GFR calc Af Amer: 40 mL/min — ABNORMAL LOW (ref >60)
GFR calc non Af Amer: 34 mL/min — ABNORMAL LOW (ref >60)
Glucose, Bld: 203 mg/dL — ABNORMAL HIGH (ref 70–99)
Potassium: 4 mmol/L (ref 3.5–5.1)
Sodium: 137 mmol/L (ref 135–145)

## 2019-03-12 LAB — GLUCOSE, CAPILLARY
Glucose-Capillary: 166 mg/dL — ABNORMAL HIGH (ref 70–99)
Glucose-Capillary: 166 mg/dL — ABNORMAL HIGH (ref 70–99)
Glucose-Capillary: 145 mg/dL — ABNORMAL HIGH (ref 70–99)

## 2019-03-12 LAB — POCT I-STAT EG7
Acid-base deficit: 3 mmol/L — ABNORMAL HIGH (ref 0.0–2.0)
Bicarbonate: 22.3 mmol/L (ref 20.0–28.0)
Calcium, Ion: 1.3 mmol/L (ref 1.15–1.40)
HCT: 33 % — ABNORMAL LOW (ref 39.0–52.0)
Hemoglobin: 11.2 g/dL — ABNORMAL LOW (ref 13.0–17.0)
O2 Saturation: 75 %
Potassium: 3.9 mmol/L (ref 3.5–5.1)
Sodium: 141 mmol/L (ref 135–145)
TCO2: 23 mmol/L (ref 22–32)
pCO2, Ven: 39.3 mmHg — ABNORMAL LOW (ref 44.0–60.0)
pH, Ven: 7.362 (ref 7.250–7.430)
pO2, Ven: 41 mmHg (ref 32.0–45.0)

## 2019-03-12 LAB — POCT I-STAT 7, (LYTES, BLD GAS, ICA,H+H)
Acid-base deficit: 4 mmol/L — ABNORMAL HIGH (ref 0.0–2.0)
Bicarbonate: 20.8 mmol/L (ref 20.0–28.0)
Calcium, Ion: 1.32 mmol/L (ref 1.15–1.40)
HCT: 34 % — ABNORMAL LOW (ref 39.0–52.0)
Hemoglobin: 11.6 g/dL — ABNORMAL LOW (ref 13.0–17.0)
O2 Saturation: 99 %
Potassium: 3.9 mmol/L (ref 3.5–5.1)
Sodium: 139 mmol/L (ref 135–145)
TCO2: 22 mmol/L (ref 22–32)
pCO2 arterial: 34.1 mmHg (ref 32.0–48.0)
pH, Arterial: 7.392 (ref 7.350–7.450)
pO2, Arterial: 125 mmHg — ABNORMAL HIGH (ref 83.0–108.0)

## 2019-03-12 SURGERY — RIGHT/LEFT HEART CATH AND CORONARY ANGIOGRAPHY
Anesthesia: LOCAL

## 2019-03-12 MED ORDER — MIDAZOLAM HCL 2 MG/2ML IJ SOLN
INTRAMUSCULAR | Status: AC
  Filled 2019-03-12: qty 2

## 2019-03-12 MED ORDER — SODIUM CHLORIDE 0.9% FLUSH: 3.0000 mL | INTRAVENOUS | Status: DC | PRN

## 2019-03-12 MED ORDER — FENTANYL CITRATE (PF) 100 MCG/2ML IJ SOLN
INTRAMUSCULAR | Status: DC | PRN
  Administered 2019-03-12 (×2): 25 ug via INTRAVENOUS

## 2019-03-12 MED ORDER — LABETALOL HCL 5 MG/ML IV SOLN
INTRAVENOUS | Status: AC
  Filled 2019-03-12: qty 4

## 2019-03-12 MED ORDER — SODIUM CHLORIDE 0.9% FLUSH: 3.0000 mL | Freq: Two times a day (BID) | INTRAVENOUS | Status: DC

## 2019-03-12 MED ORDER — LABETALOL HCL 5 MG/ML IV SOLN: 10.0000 mg | INTRAVENOUS | Status: DC | PRN

## 2019-03-12 MED ORDER — IOHEXOL 350 MG/ML SOLN
INTRAVENOUS | Status: DC | PRN
  Administered 2019-03-12: 45 mL via INTRA_ARTERIAL

## 2019-03-12 MED ORDER — SODIUM CHLORIDE 0.9 % IV SOLN: 250.0000 mL | INTRAVENOUS | Status: DC | PRN

## 2019-03-12 MED ORDER — HYDRALAZINE HCL 20 MG/ML IJ SOLN
INTRAMUSCULAR | Status: AC
  Filled 2019-03-12: qty 1

## 2019-03-12 MED ORDER — HEPARIN SODIUM (PORCINE) 1000 UNIT/ML IJ SOLN
INTRAMUSCULAR | Status: AC
  Filled 2019-03-12: qty 1

## 2019-03-12 MED ORDER — LABETALOL HCL 5 MG/ML IV SOLN
INTRAVENOUS | Status: DC | PRN
  Administered 2019-03-12: 10 mg via INTRAVENOUS

## 2019-03-12 MED ORDER — ONDANSETRON HCL 4 MG/2ML IJ SOLN: 4.0000 mg | Freq: Four times a day (QID) | INTRAMUSCULAR | Status: DC | PRN

## 2019-03-12 MED ORDER — ASPIRIN 81 MG PO CHEW: 81.0000 mg | CHEWABLE_TABLET | ORAL | Status: DC

## 2019-03-12 MED ORDER — FENTANYL CITRATE (PF) 100 MCG/2ML IJ SOLN
INTRAMUSCULAR | Status: AC
  Filled 2019-03-12: qty 2

## 2019-03-12 MED ORDER — VERAPAMIL HCL 2.5 MG/ML IV SOLN
INTRAVENOUS | Status: AC
  Filled 2019-03-12: qty 2

## 2019-03-12 MED ORDER — HEPARIN SODIUM (PORCINE) 1000 UNIT/ML IJ SOLN: INTRAMUSCULAR | Status: DC | PRN

## 2019-03-12 MED ORDER — HEPARIN (PORCINE) IN NACL 1000-0.9 UT/500ML-% IV SOLN
INTRAVENOUS | Status: AC
  Filled 2019-03-12: qty 1000

## 2019-03-12 MED ORDER — HYDRALAZINE HCL 20 MG/ML IJ SOLN
10.0000 mg | INTRAMUSCULAR | Status: DC | PRN
  Administered 2019-03-12: 10 mg via INTRAVENOUS

## 2019-03-12 MED ORDER — SODIUM CHLORIDE 0.9 % WEIGHT BASED INFUSION: 1.0000 mL/kg/h | INTRAVENOUS | Status: DC

## 2019-03-12 MED ORDER — MIDAZOLAM HCL 2 MG/2ML IJ SOLN
INTRAMUSCULAR | Status: DC | PRN
  Administered 2019-03-12: 2 mg via INTRAVENOUS
  Administered 2019-03-12: 1 mg via INTRAVENOUS

## 2019-03-12 MED ORDER — LIDOCAINE HCL (PF) 1 % IJ SOLN
INTRAMUSCULAR | Status: AC
  Filled 2019-03-12: qty 30

## 2019-03-12 MED ORDER — SODIUM CHLORIDE 0.9 % IV SOLN: INTRAVENOUS | Status: AC

## 2019-03-12 MED ORDER — SODIUM CHLORIDE 0.9 % WEIGHT BASED INFUSION
3.0000 mL/kg/h | INTRAVENOUS | Status: AC
  Administered 2019-03-12: 3 mL/kg/h via INTRAVENOUS

## 2019-03-12 MED ORDER — ACETAMINOPHEN 325 MG PO TABS: 650.0000 mg | ORAL_TABLET | ORAL | Status: DC | PRN

## 2019-03-12 MED ORDER — LIDOCAINE HCL (PF) 1 % IJ SOLN
INTRAMUSCULAR | Status: DC | PRN
  Administered 2019-03-12: 20 mL
  Administered 2019-03-12 (×2): 2 mL

## 2019-03-12 MED ORDER — VERAPAMIL HCL 2.5 MG/ML IV SOLN
INTRAVENOUS | Status: DC | PRN
  Administered 2019-03-12: 10 mL via INTRA_ARTERIAL

## 2019-03-12 SURGICAL SUPPLY — 15 items
CATH 5FR JL3.5 JR4 ANG PIG MP (CATHETERS) ×2 IMPLANT
CATH BALLN WEDGE 5F 110CM (CATHETERS) ×2 IMPLANT
CATH INFINITI 5FR JL4 (CATHETERS) ×2 IMPLANT
DEVICE RAD COMP TR BAND LRG (VASCULAR PRODUCTS) ×2 IMPLANT
GLIDESHEATH SLEND SS 6F .021 (SHEATH) ×2 IMPLANT
GUIDEWIRE INQWIRE 1.5J.035X260 (WIRE) ×1 IMPLANT
INQWIRE 1.5J .035X260CM (WIRE) ×2
KIT HEART LEFT (KITS) ×2 IMPLANT
PACK CARDIAC CATHETERIZATION (CUSTOM PROCEDURE TRAY) ×2 IMPLANT
SHEATH GLIDE SLENDER 4/5FR (SHEATH) ×2 IMPLANT
SHEATH PINNACLE 5F 10CM (SHEATH) ×2 IMPLANT
SHEATH PROBE COVER 6X72 (BAG) ×2 IMPLANT
TRANSDUCER W/STOPCOCK (MISCELLANEOUS) ×2 IMPLANT
TUBING CIL FLEX 10 FLL-RA (TUBING) ×2 IMPLANT
WIRE EMERALD 3MM-J .035X150CM (WIRE) ×2 IMPLANT

## 2019-03-12 NOTE — Progress Notes (Signed)
Site area: rt groin fa sheath Site Prior to Removal:  Level 0 Pressure Applied For: 20 minutes Manual:   yes Patient Status During Pull:  stable Post Pull Site:  Level 0 Post Pull Instructions Given:  yes Post Pull Pulses Present: rt pt palpable Dressing Applied:  Gauze and tegaderm Bedrest begins @ 1400 Comments:

## 2019-03-12 NOTE — Discharge Instructions (Signed)
Femoral Site Care °This sheet gives you information about how to care for yourself after your procedure. Your health care provider may also give you more specific instructions. If you have problems or questions, contact your health care provider. °What can I expect after the procedure? °After the procedure, it is common to have: °· Bruising that usually fades within 1-2 weeks. °· Tenderness at the site. °Follow these instructions at home: °Wound care °· Follow instructions from your health care provider about how to take care of your insertion site. Make sure you: °? Wash your hands with soap and water before you change your bandage (dressing). If soap and water are not available, use hand sanitizer. °? Change your dressing as told by your health care provider. °? Leave stitches (sutures), skin glue, or adhesive strips in place. These skin closures may need to stay in place for 2 weeks or longer. If adhesive strip edges start to loosen and curl up, you may trim the loose edges. Do not remove adhesive strips completely unless your health care provider tells you to do that. °· Do not take baths, swim, or use a hot tub until your health care provider approves. °· You may shower 24-48 hours after the procedure or as told by your health care provider. °? Gently wash the site with plain soap and water. °? Pat the area dry with a clean towel. °? Do not rub the site. This may cause bleeding. °· Do not apply powder or lotion to the site. Keep the site clean and dry. °· Check your femoral site every day for signs of infection. Check for: °? Redness, swelling, or pain. °? Fluid or blood. °? Warmth. °? Pus or a bad smell. °Activity °· For the first 2-3 days after your procedure, or as long as directed: °? Avoid climbing stairs as much as possible. °? Do not squat. °· Do not lift anything that is heavier than 10 lb (4.5 kg), or the limit that you are told, until your health care provider says that it is safe. °· Rest as  directed. °? Avoid sitting for a long time without moving. Get up to take short walks every 1-2 hours. °· Do not drive for 24 hours if you were given a medicine to help you relax (sedative). °General instructions °· Take over-the-counter and prescription medicines only as told by your health care provider. °· Keep all follow-up visits as told by your health care provider. This is important. °Contact a health care provider if you have: °· A fever or chills. °· You have redness, swelling, or pain around your insertion site. °Get help right away if: °· The catheter insertion area swells very fast. °· You pass out. °· You suddenly start to sweat or your skin gets clammy. °· The catheter insertion area is bleeding, and the bleeding does not stop when you hold steady pressure on the area. °· The area near or just beyond the catheter insertion site becomes pale, cool, tingly, or numb. °These symptoms may represent a serious problem that is an emergency. Do not wait to see if the symptoms will go away. Get medical help right away. Call your local emergency services (911 in the [place] Do not drive yourself to the hospital. °Summary °· After the procedure, it is common to have bruising that usually fades within 1-2 weeks. °· Check your femoral site every day for signs of infection. °· Do not lift anything that is heavier than 10 lb (4.5 kg), or the   limit that you are told, until your health care provider says that it is safe. This information is not intended to replace advice given to you by your health care provider. Make sure you discuss any questions you have with your health care provider. Document Released: 01/31/2014 Document Revised: 06/12/2017 Document Reviewed: 06/12/2017 Elsevier Patient Education  2020 [employer]  This sheet gives you information about how to care for yourself after your procedure. Your health care provider may also give you more specific instructions. If you have  problems or questions, contact your health care provider. What can I expect after the procedure? After the procedure, it is common to have:  Bruising and tenderness at the catheter insertion area. Follow these instructions at home: Medicines  Take over-the-counter and prescription medicines only as told by your health care provider. Insertion site care  Follow instructions from your health care provider about how to take care of your insertion site. Make sure you: ? Wash your hands with soap and water before you change your bandage (dressing). If soap and water are not available, use hand sanitizer. ? Change your dressing as told by your health care provider. ? Leave stitches (sutures), skin glue, or adhesive strips in place. These skin closures may need to stay in place for 2 weeks or longer. If adhesive strip edges start to loosen and curl up, you may trim the loose edges. Do not remove adhesive strips completely unless your health care provider tells you to do that.  Check your insertion site every day for signs of infection. Check for: ? Redness, swelling, or pain. ? Fluid or blood. ? Pus or a bad smell. ? Warmth.  Do not take baths, swim, or use a hot tub until your health care provider approves.  You may shower 24-48 hours after the procedure, or as directed by your health care provider. ? Remove the dressing and gently wash the site with plain soap and water. ? Pat the area dry with a clean towel. ? Do not rub the site. That could cause bleeding.  Do not apply powder or lotion to the site. Activity   For 24 hours after the procedure, or as directed by your health care provider: ? Do not flex or bend the affected arm. ? Do not push or pull heavy objects with the affected arm. ? Do not drive yourself home from the hospital or clinic. You may drive 24 hours after the procedure unless your health care provider tells you not to. ? Do not operate machinery or power tools.  Do  not lift anything that is heavier than 10 lb (4.5 kg), or the limit that you are told, until your health care provider says that it is safe.  Ask your health care provider when it is okay to: ? Return to work or school. ? Resume usual physical activities or sports. ? Resume sexual activity. General instructions  If the catheter site starts to bleed, raise your arm and put firm pressure on the site. If the bleeding does not stop, get help right away. This is a medical emergency.  If you went home on the same day as your procedure, a responsible adult should be with you for the first 24 hours after you arrive home.  Keep all follow-up visits as told by your health care provider. This is important. Contact a health care provider if:  You have a fever.  You have redness, swelling, or yellow drainage around your insertion  site. Get help right away if:  You have unusual pain at the radial site.  The catheter insertion area swells very fast.  The insertion area is bleeding, and the bleeding does not stop when you hold steady pressure on the area.  Your arm or hand becomes pale, cool, tingly, or numb. These symptoms may represent a serious problem that is an emergency. Do not wait to see if the symptoms will go away. Get medical help right away. Call your local emergency services (911 in the [place] Do not drive yourself to the hospital. Summary  After the procedure, it is common to have bruising and tenderness at the site.  Follow instructions from your health care provider about how to take care of your radial site wound. Check the wound every day for signs of infection.  Do not lift anything that is heavier than 10 lb (4.5 kg), or the limit that you are told, until your health care provider says that it is safe. This information is not intended to replace advice given to you by your health care provider. Make sure you discuss any questions you have with your health care  provider. Document Released: 07/02/2010 Document Revised: 07/05/2017 Document Reviewed: 07/05/2017 Elsevier Patient Education  2020 [employer].

## 2019-03-12 NOTE — Research (Signed)
[place] Informed Consent   Subject Name: [name]  Subject met inclusion and exclusion criteria.  The informed consent form, study requirements and expectations were reviewed with the subject and questions and concerns were addressed prior to the signing of the consent form.  The subject verbalized understanding of the trail requirements.  The subject agreed to participate in the [facility] trial and signed the informed consent.  The informed consent was obtained prior to performance of any protocol-specific procedures for the subject.  A copy of the signed informed consent was given to the subject and a copy was placed in the subject's medical record.  [name] D 03/12/2019, 1021 am

## 2019-03-12 NOTE — Interval H&P Note (Signed)
Cath Lab Visit (complete for each Cath Lab visit)  Clinical Evaluation Leading to the Procedure:   ACS: No.  Non-ACS:    Anginal Classification: CCS III  Anti-ischemic medical therapy: Minimal Therapy (1 class of medications)  Non-Invasive Test Results: High-risk stress test findings: cardiac mortality >3%/year  Prior CABG: No previous CABG  Evidence of prior infarct on stress test.    History and Physical Interval Note:  03/12/2019 12:00 PM  [name]  has presented today for surgery, with the diagnosis of DOE.  The various methods of treatment have been discussed with the patient and family. After consideration of risks, benefits and other options for treatment, the patient has consented to  Procedure(s): RIGHT/LEFT HEART CATH AND CORONARY ANGIOGRAPHY (N/A) as a surgical intervention.  The patient's history has been reviewed, patient examined, no change in status, stable for surgery.  I have reviewed the patient's chart and labs.  Questions were answered to the patient's satisfaction.     Larae Grooms

## 2019-03-12 NOTE — Progress Notes (Signed)
No bleeding or swelling noted after ambulation 

## 2019-03-12 NOTE — Progress Notes (Addendum)
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 Dr. Irish Lack recommended early OP f/u with [facility] for bypass eval and OP echocardiogram. I called [facility] office who requested I send staff message to Levonne Spiller to call pt - have done so. Appreciate their help. I also reviewed plan for echo with [name] who recommended to send message to Alphonsus Sias to help arrange - this has also been done, with request to be ordered under [name] so that he will get result. The offices will call the patient with these appointments. Dr. Irish Lack otherwise did not feel the patient needs to be seen back in the office acutely but he does already have a f/u later in October which we will keep.  Hjalmer Iovino PA-C

## 2019-03-13 ENCOUNTER — Encounter (HOSPITAL_COMMUNITY): Payer: Self-pay | Admitting: Interventional Cardiology

## 2019-03-13 MED FILL — Heparin Sod (Porcine)-NaCl IV Soln 1000 Unit/500ML-0.9%: INTRAVENOUS | Qty: 500 | Status: AC

## 2019-03-13 MED FILL — Heparin Sodium (Porcine) Inj 1000 Unit/ML: INTRAMUSCULAR | Qty: 10 | Status: AC

## 2019-03-14 ENCOUNTER — Telehealth: Payer: Self-pay

## 2019-03-14 DIAGNOSIS — I5041 Acute combined systolic (congestive) and diastolic (congestive) heart failure: Secondary | ICD-10-CM

## 2019-03-14 NOTE — Telephone Encounter (Signed)
-----   Message from Orinda Kenner sent at 03/14/2019  9:18 AM EDT ----- Regarding: FW: Outpatient echo Need order placed for Echo please.  Thanks, Coralyn Mark ----- Message ----- From: Charlie Pitter, PA-C Sent: 03/12/2019   2:59 PM EDT To: Manson Passey Subject: Outpatient echo                                Karel Jarvis! This is a patient that follows with [name]. He had a cath today that showed multivessel disease and is going home with plan for outpatient cardiothoracic surgery evaluation. Dr. Irish Lack recommended that Dr. Raliegh Ip get an echocardiogram to assess his LV function. We should try to get this ASAP if we can, so that surgery can make their plans. I touched base with Jamesetta So and he said to send you a message to help arrange. I didn't put in the order myself since I otherwise haven't seen the patient (I'm just covering [place]) - it seems most appropriate to put it in under [place] so that it comes back to him. I am cc'ing Jarrett Soho as well if needed.  Thank you guys! Dayna

## 2019-03-14 NOTE — Telephone Encounter (Signed)
Echo order entered.

## 2019-03-15 ENCOUNTER — Ambulatory Visit (HOSPITAL_COMMUNITY)
Admission: RE | Admit: 2019-03-15 | Discharge: 2019-03-15 | Disposition: A | Discharge status: Home / Self Care | Payer: Medicaid Other | Source: Ambulatory Visit | Attending: Cardiovascular Disease | Admitting: Cardiovascular Disease

## 2019-03-15 ENCOUNTER — Other Ambulatory Visit: Payer: Self-pay

## 2019-03-15 DIAGNOSIS — I5041 Acute combined systolic (congestive) and diastolic (congestive) heart failure: Secondary | ICD-10-CM | POA: Insufficient documentation

## 2019-03-15 NOTE — Progress Notes (Signed)
*  PRELIMINARY RESULTS* Echocardiogram 2D Echocardiogram has been performed.  Samuel Germany 03/15/2019, 2:47 PM

## 2019-03-18 ENCOUNTER — Ambulatory Visit (INDEPENDENT_AMBULATORY_CARE_PROVIDER_SITE_OTHER): Payer: Medicaid Other | Admitting: Family Medicine

## 2019-03-18 ENCOUNTER — Encounter: Payer: Self-pay | Admitting: Family Medicine

## 2019-03-18 ENCOUNTER — Other Ambulatory Visit: Payer: Self-pay | Admitting: Family Medicine

## 2019-03-18 ENCOUNTER — Other Ambulatory Visit: Payer: Self-pay

## 2019-03-18 ENCOUNTER — Encounter (INDEPENDENT_AMBULATORY_CARE_PROVIDER_SITE_OTHER): Payer: Self-pay | Admitting: *Deleted

## 2019-03-18 VITALS — BP 138/88 | Temp 97.8°F | Ht 70.0 in | Wt 216.0 lb

## 2019-03-18 DIAGNOSIS — E782 Mixed hyperlipidemia: Secondary | ICD-10-CM | POA: Diagnosis not present

## 2019-03-18 DIAGNOSIS — E113393 Type 2 diabetes mellitus with moderate nonproliferative diabetic retinopathy without macular edema, bilateral: Secondary | ICD-10-CM

## 2019-03-18 DIAGNOSIS — I10 Essential (primary) hypertension: Secondary | ICD-10-CM | POA: Diagnosis not present

## 2019-03-18 DIAGNOSIS — E119 Type 2 diabetes mellitus without complications: Secondary | ICD-10-CM

## 2019-03-18 LAB — POCT GLYCOSYLATED HEMOGLOBIN (HGB A1C): Hemoglobin A1C: 6.8 % — AB (ref 4.0–5.6)

## 2019-03-18 MED ORDER — ROSUVASTATIN CALCIUM 10 MG PO TABS: 10.0000 mg | ORAL_TABLET | Freq: Every day | ORAL | 1 refills | Status: DC

## 2019-03-18 MED ORDER — CARVEDILOL 12.5 MG PO TABS: ORAL_TABLET | ORAL | 3 refills | Status: DC

## 2019-03-18 MED ORDER — AMLODIPINE BESYLATE 2.5 MG PO TABS: 2.5000 mg | ORAL_TABLET | Freq: Every day | ORAL | 1 refills | Status: DC

## 2019-03-18 MED ORDER — GLIPIZIDE 10 MG PO TABS: 10.0000 mg | ORAL_TABLET | Freq: Two times a day (BID) | ORAL | 1 refills | Status: DC

## 2019-03-18 NOTE — Progress Notes (Signed)
Subjective:    Patient ID: [name], male    DOB: 1956-05-20, 63 y.o.   MRN: [id number]  Diabetes He presents for his follow-up diabetic visit. He has type 2 diabetes mellitus. Pertinent negatives for hypoglycemia include no confusion, dizziness or headaches. Pertinent negatives for diabetes include no chest pain and no fatigue. Current diabetic treatments: glipizide. He is compliant with treatment all of the time. Home blood sugar record trend: around 135--145.   Results for orders placed or performed in visit on 03/18/19  POCT glycosylated hemoglobin (Hb A1C)  Result Value Ref Range   Hemoglobin A1C 6.8 (A) 4.0 - 5.6 %   HbA1c POC (<> result, manual entry)     HbA1c, POC (prediabetic range)     HbA1c, POC (controlled diabetic range)     This patient also has diabetic retinopathy.  He is under the care of a retinal specialist.  He is getting intermittent laser treatments  Patient also has coronary artery disease with multiple blockages looking at bypass surgery has multiple questions regarding this.  Patient also has hyperlipidemia.  Takes his medicine on a regular basis.  Tries watch diet denies any problems with the medicine.  Patient also has high blood pressure issues.  Is been on medication ongoing.  Overall fairly decent control.  Watches salt in diet  Review of Systems  Constitutional: Negative for diaphoresis and fatigue.  HENT: Negative for congestion and rhinorrhea.   Respiratory: Negative for cough and shortness of breath.   Cardiovascular: Negative for chest pain and leg swelling.  Gastrointestinal: Negative for abdominal pain and diarrhea.  Skin: Negative for color change and rash.  Neurological: Negative for dizziness and headaches.  Psychiatric/Behavioral: Negative for behavioral problems and confusion.       Objective:   Physical Exam Vitals signs reviewed.  Constitutional:      General: He is not in acute distress. HENT:     Head: Normocephalic and  atraumatic.  Eyes:     General:        Right eye: No discharge.        Left eye: No discharge.  Neck:     Trachea: No tracheal deviation.  Cardiovascular:     Rate and Rhythm: Normal rate and regular rhythm.     Heart sounds: Normal heart sounds. No murmur.  Pulmonary:     Effort: Pulmonary effort is normal. No respiratory distress.     Breath sounds: Normal breath sounds.  Lymphadenopathy:     Cervical: No cervical adenopathy.  Skin:    General: Skin is warm and dry.  Neurological:     Mental Status: He is alert.     Coordination: Coordination normal.  Psychiatric:        Behavior: Behavior normal.       25 minutes was spent with the patient.  This statement verifies that 25 minutes was indeed spent with the patient.  More than 50% of this visit-total duration of the visit-was spent in counseling and coordination of care. The issues that the patient came in for today as reflected in the diagnosis (s) please refer to documentation for further details.     Assessment & Plan:  Severe coronary artery disease patient supposed to be getting bypass coming up soon-multiple questions answered for the patient he will see the cardiology thoracic surgery tomorrow.  They will discuss unsure many of these questions again  Diabetes fairly decent control his A1c looks better.  Very important to watch diet continue  his medications.  I am not opposed to him seeing a diabetologist but he has not been able to afford this up until recently.  Patient will do lab work again in approximately 3 months  Hyperlipidemia not at goal adjust medication we will adjust the medication to get the LDL below 70.  Follow-up labs in 3 months   Diabetic retinopathy seeing a specialist on a regular basis  I would recommend delaying colonoscopy because of bypass surgery coming up

## 2019-03-18 NOTE — Progress Notes (Signed)
[place] [address]       [place], [address]             [phone]        Roxanne M Pereda [facility] Medical Record #[id number] Date of Birth: 1955-11-23  Referring: [name], MD Primary Care: [name], MD Primary Cardiologist:Suresh Bronson Ing, MD  Chief Complaint:   No chief complaint on file.   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  He states that for the past year he has noticed some problems with his overall health.  He has had significant edema, and exertional dyspnea.  After being placed on medications, he has had some improvement, but continues to have some fatigue, and dyspnea.  He is no longer able to lay flat in bed, and often has a dry cough.  He also describes some radicular pain to both upper extremities that is positional depending on how he lays in bed.     Past Medical and Surgical History: Previous Chest Surgery: no Previous Chest Radiation: no Diabetes Mellitus: yes.  HbA1C 6.7 per report Creatinine: 2.03  Past Medical History:  Diagnosis Date  . Diabetes mellitus without complication ([name])   . Hypertension     Past Surgical History:  Procedure Laterality Date  . EXCISION MORTON'S NEUROMA    . RIGHT/LEFT HEART CATH AND CORONARY ANGIOGRAPHY N/A 03/12/2019   Procedure: RIGHT/LEFT HEART CATH AND CORONARY ANGIOGRAPHY;  Surgeon: [name], MD;  Location: [facility];  Service: Cardiovascular;  Laterality: N/A;    Social History: Support: recently married.  Social History   Tobacco Use  Smoking Status Former Smoker  . Quit date: 07/06/1988  . Years since quitting: 30.7  Smokeless Tobacco Never Used    Social History   Substance and Sexual Activity  Alcohol Use Not Currently   Comment: socially     Allergies  Allergen Reactions  . Bee Venom Anaphylaxis    Medications: Asprin: yes Statin: yes Beta Blocker: yes  Ace Inhibitor: no Anti-Coagulation: no  Current Outpatient Medications  Medication Sig Dispense Refill  . acetaminophen (TYLENOL) 500 MG tablet Take 1,000 mg by mouth every 6 (six) hours as needed for moderate pain or headache.    Marland Kitchen aspirin 81 MG chewable tablet Chew 1 tablet (81 mg total) by mouth daily. (Patient not taking: Reported on 03/11/2019) 30 tablet 0  . aspirin EC 81 MG tablet Take 81 mg by mouth daily.    . carvedilol (COREG) 12.5 MG tablet TAKE 1 TABLET BY MOUTH TWICE A DAY WITH A MEAL (Patient taking differently: Take 12.5 mg by mouth 2 (two) times daily with a meal. ) 50 tablet 3  . Cholecalciferol (VITAMIN D3) 50 MCG (2000 UT) capsule Take 2,000 Units by mouth daily.    . Coenzyme Q10 (COQ-10) 400 MG CAPS Take 400 mg by mouth daily.    . Cyanocobalamin (B-12) 2500 MCG TABS Take 2,500 mcg by mouth daily.    . diphenhydramine-acetaminophen (TYLENOL PM) 25-500 MG TABS tablet Take 1 tablet by mouth at bedtime as needed (sleep).    . furosemide (LASIX) 40 MG tablet Take 40 mg am (Patient taking differently: Take 40 mg by mouth daily. ) 90 tablet 3  . glipiZIDE (GLUCOTROL) 10 MG tablet TAKE ONE TABLET BY MOUTH TWICE A DAY (Patient taking differently: Take 10 mg by mouth 2 (two)  times daily. ) 180 tablet 0  . hydrALAZINE (APRESOLINE) 25 MG tablet Take 75 mg by mouth 2 (two) times daily.    . hydrALAZINE (APRESOLINE) 50 MG tablet Take 1 tablet (50 mg total) by mouth 3 (three) times daily. 270 tablet 3  . loratadine (CLARITIN) 10 MG tablet Take 10 mg by mouth daily as needed for allergies.    . Misc Natural Products (OSTEO BI-FLEX TRIPLE STRENGTH PO) Take 1 tablet by mouth daily.    . Multiple Vitamin (MULTIVITAMIN WITH MINERALS) TABS tablet Take 1 tablet by mouth daily.    . nitroGLYCERIN (NITROSTAT) 0.4 MG SL tablet Place 1 tablet (0.4 mg total) under the tongue every 5 (five) minutes as needed. 25 tablet 3  . rosuvastatin (CRESTOR) 5 MG tablet Take 1 tablet (5 mg total) by mouth daily.  90 tablet 3  . trolamine salicylate (ASPERCREME) 10 % cream Apply 1 application topically as needed for muscle pain.     No current facility-administered medications for this visit.     (Not in a hospital admission)   Family History  Problem Relation Age of Onset  . Cancer Mother   . Heart Problems Father      Review of Systems:   Review of Systems  Constitutional: Positive for malaise/fatigue and weight loss. Negative for chills, diaphoresis and fever.  HENT: Positive for sore throat.   Eyes: Positive for blurred vision.  Respiratory: Positive for cough and shortness of breath. Negative for sputum production.   Cardiovascular: Positive for orthopnea, claudication and leg swelling. Negative for chest pain.  Gastrointestinal: Positive for constipation and heartburn.  Musculoskeletal: Positive for joint pain, myalgias and neck pain.  Skin: Negative.   Neurological: Negative.   Psychiatric/Behavioral: The patient is nervous/anxious.       Physical Exam: There were no vitals taken for this visit. Physical Exam  Constitutional: He is oriented to person, place, and time and well-developed, well-nourished, and in no distress. No distress.  HENT:  Head: Normocephalic and atraumatic.  Mouth/Throat: No oropharyngeal exudate.  Eyes: Conjunctivae and EOM are normal.  Neck: Normal range of motion. No tracheal deviation present.  Cardiovascular: Normal rate, regular rhythm and normal heart sounds.  No murmur heard. Pulmonary/Chest: Effort normal and breath sounds normal. No respiratory distress.  Abdominal: Soft. He exhibits no distension.  Musculoskeletal: Normal range of motion.        General: No edema.  Neurological: He is alert and oriented to person, place, and time.  Skin: Skin is warm and dry. He is not diaphoretic.  Psychiatric: Affect normal.      Diagnostic Studies & Laboratory data:    Left Heart Catherization:  Mid LAD lesion is 80% stenosed.  Ost LAD to Mid  LAD lesion is 50% stenosed.  1st Diag lesion is 80% stenosed.  2nd Diag lesion is 75% stenosed.  Mid LM to Dist LM lesion is 25% stenosed.  Prox Cx lesion is 90% stenosed.  1st Mrg lesion is 80% stenosed.  2nd Mrg lesion is 80% stenosed.  Dist RCA lesion is 90% stenosed.  Mid RCA lesion is 75% stenosed.  RPAV lesion is 80% stenosed.  LV end diastolic pressure is moderately elevated.  There is no aortic valve stenosis.  Hemodynamic findings consistent with mild pulmonary hypertension.  Echo:  1. Left ventricular ejection fraction, by visual estimation, is 45 to 50%. The left ventricle has mildly decreased function. There is mildly increased left ventricular hypertrophy.  2. Left ventricular diastolic Doppler parameters are  consistent with impaired relaxation pattern of LV diastolic filling.  3. Global right ventricle has normal systolic function.The right ventricular size is normal. No increase in right ventricular wall thickness.  4. Left atrial size was severely dilated.  5. Right atrial size was normal.  6. Mild mitral annular calcification.  7. Moderate calcification of the mitral valve leaflet(s).  8. Moderate thickening of the mitral valve leaflet(s).  9. The mitral valve is abnormal. Trace mitral valve regurgitation. No evidence of mitral stenosis. 10. The tricuspid valve is normal in structure. Tricuspid valve regurgitation is trivial. 11. The aortic valve is tricuspid Aortic valve regurgitation was not visualized by color flow Doppler. Mild aortic valve sclerosis without stenosis. 12. The pulmonic valve was not well visualized. Pulmonic valve regurgitation is mild by color flow Doppler. 13. Mildly elevated pulmonary artery systolic pressure.   I have independently reviewed the above radiologic studies and discussed with the patient   Recent Lab Findings: Lab Results  Component Value Date   WBC 8.2 03/06/2019   HGB 11.2 (L) 03/12/2019   HCT 33.0 (L) 03/12/2019    PLT 248 03/06/2019   GLUCOSE 203 (H) 03/12/2019   CHOL 152 12/31/2018   TRIG 172 (H) 12/31/2018   HDL 41 12/31/2018   LDLCALC 77 12/31/2018   NA 141 03/12/2019   K 3.9 03/12/2019   CL 103 03/12/2019   CREATININE 2.03 (H) 03/12/2019   BUN 29 (H) 03/12/2019   CO2 22 03/12/2019   TSH 2.110 04/29/2018   INR 0.97 09/20/2009   HGBA1C 8.1 (A) 11/13/2018      Assessment / Plan:   63 yo male with 3V CAD, and EF 45%.  No evidence of valvular disease.  Risks and benefits of coronary artery bypass grafting have been discussed, and he is agreeable to proceed with surgery.  He has a good OM2, and PDA target.  The LAD target is somewhat small, but he has a descent D1 target that covers a large distribution as well.  Both will be bypassed.  He is tentatively scheduled for 10/12, CABGX 4.   I  spent 40 minutes counseling the patient face to face.   [name] 03/18/2019 9:14 AM

## 2019-03-19 ENCOUNTER — Encounter: Payer: Self-pay | Admitting: Thoracic Surgery (Cardiothoracic Vascular Surgery)

## 2019-03-19 ENCOUNTER — Other Ambulatory Visit: Payer: Self-pay | Admitting: *Deleted

## 2019-03-19 ENCOUNTER — Institutional Professional Consult (permissible substitution): Payer: Medicaid Other | Admitting: Thoracic Surgery (Cardiothoracic Vascular Surgery)

## 2019-03-19 VITALS — BP 151/85 | HR 83 | Temp 97.7°F | Resp 16 | Ht 70.0 in | Wt 214.8 lb

## 2019-03-19 DIAGNOSIS — N183 Chronic kidney disease, stage 3 unspecified: Secondary | ICD-10-CM | POA: Diagnosis not present

## 2019-03-19 DIAGNOSIS — I509 Heart failure, unspecified: Secondary | ICD-10-CM | POA: Diagnosis not present

## 2019-03-19 DIAGNOSIS — I2510 Atherosclerotic heart disease of native coronary artery without angina pectoris: Secondary | ICD-10-CM

## 2019-03-20 NOTE — Pre-Procedure Instructions (Signed)
[name], [name] [name] [address] Phone: [phone] Fax: [phone]      Your procedure is scheduled on  03-25-19  Report to [facility] Main Entrance "A" at 0530 A.M., and check in at the Admitting office.  Call this number if you have problems the morning of surgery:  [phone]  Call [phone] if you have any questions prior to your surgery date Monday-Friday 8am-4pm    Remember:  Do not eat or drink after midnight the night before your surgery  Take these medicines the morning of surgery with A SIP OF WATER : rosuvastatin (CRESTOR) hydrALAZINE (APRESOLINE)  carvedilol (COREG) amLODipine (NORVASC) acetaminophen (TYLENOL)as needed loratadine (CLARITIN)as needed nitroGLYCERIN (NITROSTAT)as needed  Follow your surgeon's instructions on when to stop Aspirin.  If no instructions were given by your surgeon then you will need to call the office to get those instructions.    7 days prior to surgery STOP taking any Aspirin (unless otherwise instructed by your surgeon), Aleve, Naproxen, Ibuprofen, Motrin, Advil, Goody's, BC's, all herbal medications, fish oil, and all vitamins.   WHAT DO I DO ABOUT MY DIABETES MEDICATION?   [name] Kitchen Do not take oral diabetes medicines (pills) the morning of surgery.  . THE NIGHT BEFORE SURGERY, DO NOT take evening dose of Glipizide..     . THE MORNING OF SURGERY,DO NOT  take your morning dose of Glipizide.   How to Manage Your Diabetes Before and After Surgery  Why is it important to control my blood sugar before and after surgery? . Improving blood sugar levels before and after surgery helps healing and can limit problems. . A way of improving blood sugar control is eating a healthy diet by: o  Eating less sugar and carbohydrates o  Increasing activity/exercise o  Talking with your doctor about reaching your blood sugar goals . High blood sugars (greater than 180 mg/dL) can  raise your risk of infections and slow your recovery, so you will need to focus on controlling your diabetes during the weeks before surgery. . Make sure that the doctor who takes care of your diabetes knows about your planned surgery including the date and location.  How do I manage my blood sugar before surgery? . Check your blood sugar at least 4 times a day, starting 2 days before surgery, to make sure that the level is not too high or low. o Check your blood sugar the morning of your surgery when you wake up and every 2 hours until you get to the Short Stay unit. . If your blood sugar is less than 70 mg/dL, you will need to treat for low blood sugar: o Do not take insulin. o Treat a low blood sugar (less than 70 mg/dL) with  cup of clear juice (cranberry or apple), 4 glucose tablets, OR glucose gel. o Recheck blood sugar in 15 minutes after treatment (to make sure it is greater than 70 mg/dL). If your blood sugar is not greater than 70 mg/dL on recheck, call [phone] for further instructions. . Report your blood sugar to the short stay nurse when you get to Short Stay.  . If you are admitted to the hospital after surgery: o Your blood sugar will be checked by the staff and you will probably be given insulin after surgery (instead of oral diabetes medicines) to make sure you have good blood sugar levels. o The goal for blood sugar control after surgery is 80-180  mg/dL.    The Morning of Surgery  Do not wear jewelry, make-up or nail polish.Andres Labrum, or perfumes/colognes, or deodorant    Men may shave face and neck.  Do not bring valuables to the hospital.  [facility] is not responsible for any belongings or valuables.  If you are a smoker, DO NOT Smoke 24 hours prior to surgery IF you wear a CPAP at night please bring your mask, tubing, and machine the morning of surgery   Remember that you must have someone to transport you home after your surgery, and remain with you for 24 hours  if you are discharged the same day.   Contacts, glasses, hearing aids, dentures or bridgework may not be worn into surgery.    Leave your suitcase in the car.  After surgery it may be brought to your room.  For patients admitted to the hospital, discharge time will be determined by your treatment team.  Patients discharged the day of surgery will not be allowed to drive home.    Special instructions:   [facility]- Preparing For Surgery  Before surgery, you can play an important role. Because skin is not sterile, your skin needs to be as free of germs as possible. You can reduce the number of germs on your skin by washing with CHG (chlorahexidine gluconate) Soap before surgery.  CHG is an antiseptic cleaner which kills germs and bonds with the skin to continue killing germs even after washing.    Oral Hygiene is also important to reduce your risk of infection.  Remember - BRUSH YOUR TEETH THE MORNING OF SURGERY WITH YOUR REGULAR TOOTHPASTE  Please do not use if you have an allergy to CHG or antibacterial soaps. If your skin becomes reddened/irritated stop using the CHG.  Do not shave (including legs and underarms) for at least 48 hours prior to first CHG shower. It is OK to shave your face.  Please follow these instructions carefully.   1. Shower the NIGHT BEFORE SURGERY and the MORNING OF SURGERY with CHG Soap.   2. If you chose to wash your hair, wash your hair first as usual with your normal shampoo.  3. After you shampoo, rinse your hair and body thoroughly to remove the shampoo.  4. Use CHG as you would any other liquid soap. You can apply CHG directly to the skin and wash gently with a scrungie or a clean washcloth.   5. Apply the CHG Soap to your body ONLY FROM THE NECK DOWN.  Do not use on open wounds or open sores. Avoid contact with your eyes, ears, mouth and genitals (private parts). Wash Face and genitals (private parts)  with your normal soap.   6. Wash thoroughly,  paying special attention to the area where your surgery will be performed.  7. Thoroughly rinse your body with warm water from the neck down.  8. DO NOT shower/wash with your normal soap after using and rinsing off the CHG Soap.  9. Pat yourself dry with a CLEAN TOWEL.  10. Wear CLEAN PAJAMAS to bed the night before surgery, wear comfortable clothes the morning of surgery  11. Place CLEAN SHEETS on your bed the night of your first shower and DO NOT SLEEP WITH PETS.  Day of Surgery:  Do not apply any deodorants/lotions. Please shower the morning of surgery with the CHG soap  Please wear clean clothes to the hospital/surgery center.   Remember to brush your teeth WITH YOUR REGULAR TOOTHPASTE.  Please read  over the  fact sheets that you were given.

## 2019-03-21 ENCOUNTER — Encounter (HOSPITAL_COMMUNITY): Payer: Self-pay

## 2019-03-21 ENCOUNTER — Ambulatory Visit (HOSPITAL_COMMUNITY)
Admission: RE | Admit: 2019-03-21 | Discharge: 2019-03-21 | Disposition: A | Discharge status: Home / Self Care | Payer: Medicaid Other | Source: Ambulatory Visit | Attending: Thoracic Surgery (Cardiothoracic Vascular Surgery) | Admitting: Thoracic Surgery (Cardiothoracic Vascular Surgery)

## 2019-03-21 ENCOUNTER — Other Ambulatory Visit (HOSPITAL_COMMUNITY)
Admission: RE | Admit: 2019-03-21 | Discharge: 2019-03-21 | Disposition: A | Discharge status: Home / Self Care | Payer: Medicaid Other | Source: Ambulatory Visit | Attending: Thoracic Surgery (Cardiothoracic Vascular Surgery) | Admitting: Thoracic Surgery (Cardiothoracic Vascular Surgery)

## 2019-03-21 ENCOUNTER — Encounter (HOSPITAL_COMMUNITY)
Admission: RE | Admit: 2019-03-21 | Discharge: 2019-03-21 | Disposition: A | Discharge status: Home / Self Care | Payer: Medicaid Other | Source: Ambulatory Visit | Attending: Thoracic Surgery (Cardiothoracic Vascular Surgery) | Admitting: Thoracic Surgery (Cardiothoracic Vascular Surgery)

## 2019-03-21 ENCOUNTER — Other Ambulatory Visit: Payer: Self-pay

## 2019-03-21 DIAGNOSIS — Z20828 Contact with and (suspected) exposure to other viral communicable diseases: Secondary | ICD-10-CM | POA: Diagnosis not present

## 2019-03-21 DIAGNOSIS — R0602 Shortness of breath: Secondary | ICD-10-CM | POA: Diagnosis not present

## 2019-03-21 DIAGNOSIS — I2510 Atherosclerotic heart disease of native coronary artery without angina pectoris: Secondary | ICD-10-CM

## 2019-03-21 LAB — SURGICAL PCR SCREEN
MRSA, PCR: NEGATIVE
Staphylococcus aureus: POSITIVE — AB

## 2019-03-21 LAB — COMPREHENSIVE METABOLIC PANEL
ALT: 20 U/L (ref 0–44)
AST: 19 U/L (ref 15–41)
Albumin: 3.9 g/dL (ref 3.5–5.0)
Alkaline Phosphatase: 56 U/L (ref 38–126)
Anion gap: 12 (ref 5–15)
BUN: 32 mg/dL — ABNORMAL HIGH (ref 8–23)
CO2: 21 mmol/L — ABNORMAL LOW (ref 22–32)
Calcium: 9.5 mg/dL (ref 8.9–10.3)
Chloride: 103 mmol/L (ref 98–111)
Creatinine, Ser: 2.14 mg/dL — ABNORMAL HIGH (ref 0.61–1.24)
GFR calc Af Amer: 37 mL/min — ABNORMAL LOW (ref >60)
GFR calc non Af Amer: 32 mL/min — ABNORMAL LOW (ref >60)
Glucose, Bld: 187 mg/dL — ABNORMAL HIGH (ref 70–99)
Potassium: 3.7 mmol/L (ref 3.5–5.1)
Sodium: 136 mmol/L (ref 135–145)
Total Bilirubin: 0.4 mg/dL (ref 0.3–1.2)
Total Protein: 6.7 g/dL (ref 6.5–8.1)

## 2019-03-21 LAB — BLOOD GAS, ARTERIAL
Acid-base deficit: 1 mmol/L (ref 0.0–2.0)
Bicarbonate: 22.7 mmol/L (ref 20.0–28.0)
Drawn by: 421801
FIO2: 21
O2 Saturation: 97.2 %
Patient temperature: 98.6
pCO2 arterial: 35.1 mmHg (ref 32.0–48.0)
pH, Arterial: 7.427 (ref 7.350–7.450)
pO2, Arterial: 90.4 mmHg (ref 83.0–108.0)

## 2019-03-21 LAB — URINALYSIS, ROUTINE W REFLEX MICROSCOPIC
Bacteria, UA: NONE SEEN
Bilirubin Urine: NEGATIVE
Glucose, UA: 150 mg/dL — AB
Hgb urine dipstick: NEGATIVE
Ketones, ur: NEGATIVE mg/dL
Leukocytes,Ua: NEGATIVE
Nitrite: NEGATIVE
Protein, ur: 100 mg/dL — AB
Specific Gravity, Urine: 1.015 (ref 1.005–1.030)
pH: 5 (ref 5.0–8.0)

## 2019-03-21 LAB — PROTIME-INR
INR: 1 (ref 0.8–1.2)
Prothrombin Time: 12.6 seconds (ref 11.4–15.2)

## 2019-03-21 LAB — CBC
HCT: 35.2 % — ABNORMAL LOW (ref 39.0–52.0)
Hemoglobin: 11.7 g/dL — ABNORMAL LOW (ref 13.0–17.0)
MCH: 28.1 pg (ref 26.0–34.0)
MCHC: 33.2 g/dL (ref 30.0–36.0)
MCV: 84.4 fL (ref 80.0–100.0)
Platelets: 267 10*3/uL (ref 150–400)
RBC: 4.17 MIL/uL — ABNORMAL LOW (ref 4.22–5.81)
RDW: 13.2 % (ref 11.5–15.5)
WBC: 7.6 10*3/uL (ref 4.0–10.5)
nRBC: 0 % (ref 0.0–0.2)

## 2019-03-21 LAB — APTT: aPTT: 30 seconds (ref 24–36)

## 2019-03-21 LAB — GLUCOSE, CAPILLARY: Glucose-Capillary: 198 mg/dL — ABNORMAL HIGH (ref 70–99)

## 2019-03-21 NOTE — Progress Notes (Signed)
Surgical PCR +MSSA. Will need betadine DOS per protocol.

## 2019-03-21 NOTE — Progress Notes (Signed)
Scheduled for COVID No sx of fever,cough,runny nose,SOB, loss of smell /tatse, vomiting,diarrhea. No h/o travel.  PCP - Dr [name]  Cardiologist - Dr [name]  Chest x-ray - Today  EKG - 03-12-19  Stress Test - >22yrs back  ECHO - 03-15-19  Cardiac Cath - 03-12-19  AICD-denies PM-denies LOOP-denies  Sleep Study - N.  STOPBANG score 5. Note sent to PCP CPAP -   LABS- CBC,CMP,ABG,APTT,UA,T/S, PT-INR  ASA-Pt states will follow up with Surgeon's office regarding if needed to hold ASA.  ERAS-NA  HA1C-6.8 (03/18/19) Fasting Blood Sugar -198.  Lo-135 Hi-200's Checks Blood Sugar __1___ times a day  Anesthesia-HTN, DM-2, CHF  Pt denies having chest pain, sob, or fever at this time. All instructions explained to the pt, with a verbal understanding of the material. Pt agrees to go over the instructions while at home for a better understanding. Pt also instructed to self quarantine after being tested for COVID-19. The opportunity to ask questions was provided.

## 2019-03-21 NOTE — Progress Notes (Signed)
   03/21/19 1507  OBSTRUCTIVE SLEEP APNEA  Have you ever been diagnosed with sleep apnea through a sleep study? No  Do you snore loudly (loud enough to be heard through closed doors)?  1  Do you often feel tired, fatigued, or sleepy during the daytime (such as falling asleep during driving or talking to someone)? 0  Has anyone observed you stop breathing during your sleep? 0  Do you have, or are you being treated for high blood pressure? 1  BMI more than 35 kg/m2? 0  Age > 50 (1-yes) 1  Neck circumference greater than:Male 16 inches or larger, Male 17inches or larger? 1  Male Gender (Yes=1) 1  Obstructive Sleep Apnea Score 5  Score 5 or greater  Results sent to PCP

## 2019-03-22 LAB — NOVEL CORONAVIRUS, NAA (HOSP ORDER, SEND-OUT TO REF LAB; TAT 18-24 HRS): SARS-CoV-2, NAA: NOT DETECTED

## 2019-03-22 MED ORDER — TRANEXAMIC ACID (OHS) PUMP PRIME SOLUTION
2.0000 mg/kg | INTRAVENOUS | Status: DC
  Filled 2019-03-22: qty 1.95

## 2019-03-22 MED ORDER — POTASSIUM CHLORIDE 2 MEQ/ML IV SOLN
80.0000 meq | INTRAVENOUS | Status: DC
  Filled 2019-03-22: qty 40

## 2019-03-22 MED ORDER — PHENYLEPHRINE HCL-NACL 20-0.9 MG/250ML-% IV SOLN
30.0000 ug/min | INTRAVENOUS | Status: AC
  Administered 2019-03-25: 30 ug/min via INTRAVENOUS
  Filled 2019-03-22: qty 250

## 2019-03-22 MED ORDER — INSULIN REGULAR(HUMAN) IN NACL 100-0.9 UT/100ML-% IV SOLN
INTRAVENOUS | Status: AC
  Administered 2019-03-25: 3.3 [IU]/h via INTRAVENOUS
  Filled 2019-03-22: qty 100

## 2019-03-22 MED ORDER — EPINEPHRINE HCL 5 MG/250ML IV SOLN IN NS
0.0000 ug/min | INTRAVENOUS | Status: DC
  Filled 2019-03-22: qty 250

## 2019-03-22 MED ORDER — DEXMEDETOMIDINE HCL IN NACL 400 MCG/100ML IV SOLN
0.1000 ug/kg/h | INTRAVENOUS | Status: AC
  Administered 2019-03-25: .5 ug/kg/h via INTRAVENOUS
  Filled 2019-03-22: qty 100

## 2019-03-22 MED ORDER — VANCOMYCIN HCL 10 G IV SOLR
1500.0000 mg | INTRAVENOUS | Status: AC
  Administered 2019-03-25: 1500 mg via INTRAVENOUS
  Filled 2019-03-22: qty 1500

## 2019-03-22 MED ORDER — TRANEXAMIC ACID (OHS) BOLUS VIA INFUSION
15.0000 mg/kg | INTRAVENOUS | Status: AC
  Administered 2019-03-25: 1461 mg via INTRAVENOUS
  Filled 2019-03-22: qty 1461

## 2019-03-22 MED ORDER — MAGNESIUM SULFATE 50 % IJ SOLN
40.0000 meq | INTRAMUSCULAR | Status: DC
  Filled 2019-03-22: qty 9.85

## 2019-03-22 MED ORDER — PLASMA-LYTE 148 IV SOLN
INTRAVENOUS | Status: AC
  Administered 2019-03-25: 500 mL
  Filled 2019-03-22: qty 2.5

## 2019-03-22 MED ORDER — MILRINONE LACTATE IN DEXTROSE 20-5 MG/100ML-% IV SOLN
0.3000 ug/kg/min | INTRAVENOUS | Status: DC
  Filled 2019-03-22: qty 100

## 2019-03-22 MED ORDER — SODIUM CHLORIDE 0.9 % IV SOLN
INTRAVENOUS | Status: DC
  Filled 2019-03-22: qty 30

## 2019-03-22 MED ORDER — MANNITOL 20 % IV SOLN
Freq: Once | INTRAVENOUS | Status: DC
  Filled 2019-03-22: qty 13

## 2019-03-22 MED ORDER — NITROGLYCERIN IN D5W 200-5 MCG/ML-% IV SOLN
2.0000 ug/min | INTRAVENOUS | Status: DC
  Filled 2019-03-22: qty 250

## 2019-03-22 MED ORDER — TRANEXAMIC ACID 1000 MG/10ML IV SOLN
1.5000 mg/kg/h | INTRAVENOUS | Status: AC
  Administered 2019-03-25: 1.5 mg/kg/h via INTRAVENOUS
  Filled 2019-03-22 (×2): qty 25

## 2019-03-22 MED ORDER — SODIUM CHLORIDE 0.9 % IV SOLN
1.5000 g | INTRAVENOUS | Status: AC
  Administered 2019-03-25: 1.5 g via INTRAVENOUS
  Filled 2019-03-22: qty 1.5

## 2019-03-22 MED ORDER — SODIUM CHLORIDE 0.9 % IV SOLN
750.0000 mg | INTRAVENOUS | Status: DC
  Filled 2019-03-22: qty 750

## 2019-03-22 MED ORDER — DOPAMINE-DEXTROSE 3.2-5 MG/ML-% IV SOLN
0.0000 ug/kg/min | INTRAVENOUS | Status: DC
  Filled 2019-03-22: qty 250

## 2019-03-22 NOTE — Anesthesia Preprocedure Evaluation (Addendum)
Anesthesia Evaluation  Patient identified by MRN, date of birth, ID band Patient awake    Reviewed: Allergy & Precautions, NPO status , Patient's Chart, lab work & pertinent test results, reviewed documented beta blocker date and time   Airway Mallampati: II  TM Distance: >3 FB Neck ROM: Full    Dental  (+) Dental Advisory Given   Pulmonary former smoker,    Pulmonary exam normal breath sounds clear to auscultation       Cardiovascular hypertension, Pt. on medications and Pt. on home beta blockers + CAD and +CHF  Normal cardiovascular exam Rhythm:Regular Rate:Normal  Echo 03/15/2019  1. Left ventricular ejection fraction, by visual estimation, is 45 to 50%. The left ventricle has mildly decreased function. There is mildly increased left ventricular hypertrophy.  2. Left ventricular diastolic Doppler parameters are consistent with impaired relaxation pattern of LV diastolic filling.  3. Global right ventricle has normal systolic function.The right ventricular size is normal. No increase in right ventricular wall thickness.  4. Left atrial size was severely dilated.  5. Right atrial size was normal.  6. Mild mitral annular calcification.  7. Moderate calcification of the mitral valve leaflet(s).  8. Moderate thickening of the mitral valve leaflet(s).  9. The mitral valve is abnormal. Trace mitral valve regurgitation. No evidence of mitral stenosis. 10. The tricuspid valve is normal in structure. Tricuspid valve regurgitation is trivial. 11. The aortic valve is tricuspid Aortic valve regurgitation was not visualized by color flow Doppler. Mild aortic valve sclerosis without stenosis. 12. The pulmonic valve was not well visualized. Pulmonic valve regurgitation is mild by color flow Doppler. 13. Mildly elevated pulmonary artery systolic pressure.   Neuro/Psych negative neurological ROS     GI/Hepatic negative GI ROS, Neg liver ROS,    Endo/Other  diabetes, Type 2, Oral Hypoglycemic Agents  Renal/GU Renal disease     Musculoskeletal negative musculoskeletal ROS (+)   Abdominal   Peds  Hematology negative hematology ROS (+)   Anesthesia Other Findings Day of surgery medications reviewed with the patient.  Reproductive/Obstetrics                          Anesthesia Physical Anesthesia Plan  ASA: IV  Anesthesia Plan: General   Post-op Pain Management:    Induction: Intravenous  PONV Risk Score and Plan: 2 and Ondansetron, Midazolam and Treatment may vary due to age or medical condition  Airway Management Planned: Oral ETT  Additional Equipment: Arterial line, CVP, PA Cath, TEE and Ultrasound Guidance Line Placement  Intra-op Plan: Utilization Of Total Body Hypothermia per surgeon request  Post-operative Plan: Post-operative intubation/ventilation  Informed Consent: I have reviewed the patients History and Physical, chart, labs and discussed the procedure including the risks, benefits and alternatives for the proposed anesthesia with the patient or authorized representative who has indicated his/her understanding and acceptance.     Dental advisory given  Plan Discussed with: CRNA  Anesthesia Plan Comments: (CKD III followed by PCP Dr. [name]. Creatinine 2.14 on preop labs. Recent baseline appears to be ~1.9.)       Anesthesia Quick Evaluation

## 2019-03-24 MED ORDER — NOREPINEPHRINE 4 MG/250ML-% IV SOLN
0.0000 ug/min | INTRAVENOUS | Status: DC
  Filled 2019-03-24: qty 250

## 2019-03-25 ENCOUNTER — Inpatient Hospital Stay (HOSPITAL_COMMUNITY): Payer: Medicaid Other | Admitting: Registered Nurse

## 2019-03-25 ENCOUNTER — Inpatient Hospital Stay (HOSPITAL_COMMUNITY): Payer: Medicaid Other | Admitting: Physician Assistant

## 2019-03-25 ENCOUNTER — Inpatient Hospital Stay (HOSPITAL_COMMUNITY)
Admission: RE | Admit: 2019-03-25 | Discharge: 2019-03-30 | DRG: 236 | Disposition: A | Discharge status: Home / Self Care | Payer: Medicaid Other | Attending: Thoracic Surgery (Cardiothoracic Vascular Surgery) | Admitting: Thoracic Surgery (Cardiothoracic Vascular Surgery)

## 2019-03-25 ENCOUNTER — Encounter (HOSPITAL_COMMUNITY): Payer: Self-pay | Admitting: Surgery

## 2019-03-25 ENCOUNTER — Other Ambulatory Visit: Payer: Self-pay

## 2019-03-25 ENCOUNTER — Inpatient Hospital Stay (HOSPITAL_COMMUNITY): Payer: Medicaid Other

## 2019-03-25 ENCOUNTER — Inpatient Hospital Stay (HOSPITAL_COMMUNITY)
Admission: RE | Disposition: A | Discharge status: Home / Self Care | Payer: Self-pay | Source: Home / Self Care | Attending: Thoracic Surgery (Cardiothoracic Vascular Surgery)

## 2019-03-25 DIAGNOSIS — Z79899 Other long term (current) drug therapy: Secondary | ICD-10-CM

## 2019-03-25 DIAGNOSIS — Z7984 Long term (current) use of oral hypoglycemic drugs: Secondary | ICD-10-CM | POA: Diagnosis not present

## 2019-03-25 DIAGNOSIS — E872 Acidosis: Secondary | ICD-10-CM | POA: Diagnosis present

## 2019-03-25 DIAGNOSIS — E113393 Type 2 diabetes mellitus with moderate nonproliferative diabetic retinopathy without macular edema, bilateral: Secondary | ICD-10-CM | POA: Diagnosis not present

## 2019-03-25 DIAGNOSIS — I129 Hypertensive chronic kidney disease with stage 1 through stage 4 chronic kidney disease, or unspecified chronic kidney disease: Secondary | ICD-10-CM | POA: Diagnosis not present

## 2019-03-25 DIAGNOSIS — D62 Acute posthemorrhagic anemia: Secondary | ICD-10-CM | POA: Diagnosis not present

## 2019-03-25 DIAGNOSIS — E1122 Type 2 diabetes mellitus with diabetic chronic kidney disease: Secondary | ICD-10-CM | POA: Diagnosis not present

## 2019-03-25 DIAGNOSIS — Z87891 Personal history of nicotine dependence: Secondary | ICD-10-CM

## 2019-03-25 DIAGNOSIS — I2510 Atherosclerotic heart disease of native coronary artery without angina pectoris: Secondary | ICD-10-CM | POA: Diagnosis not present

## 2019-03-25 DIAGNOSIS — M79604 Pain in right leg: Secondary | ICD-10-CM | POA: Diagnosis not present

## 2019-03-25 DIAGNOSIS — R600 Localized edema: Secondary | ICD-10-CM | POA: Diagnosis not present

## 2019-03-25 DIAGNOSIS — M5410 Radiculopathy, site unspecified: Secondary | ICD-10-CM | POA: Diagnosis not present

## 2019-03-25 DIAGNOSIS — R609 Edema, unspecified: Secondary | ICD-10-CM | POA: Diagnosis not present

## 2019-03-25 DIAGNOSIS — J90 Pleural effusion, not elsewhere classified: Secondary | ICD-10-CM | POA: Diagnosis not present

## 2019-03-25 DIAGNOSIS — Z7982 Long term (current) use of aspirin: Secondary | ICD-10-CM | POA: Diagnosis not present

## 2019-03-25 DIAGNOSIS — Z951 Presence of aortocoronary bypass graft: Secondary | ICD-10-CM | POA: Diagnosis not present

## 2019-03-25 DIAGNOSIS — N183 Chronic kidney disease, stage 3 unspecified: Secondary | ICD-10-CM | POA: Diagnosis present

## 2019-03-25 DIAGNOSIS — J9811 Atelectasis: Secondary | ICD-10-CM | POA: Diagnosis not present

## 2019-03-25 DIAGNOSIS — I088 Other rheumatic multiple valve diseases: Secondary | ICD-10-CM | POA: Diagnosis not present

## 2019-03-25 HISTORY — PX: CORONARY ARTERY BYPASS GRAFT: SHX141

## 2019-03-25 HISTORY — PX: TEE WITHOUT CARDIOVERSION: SHX5443

## 2019-03-25 LAB — GLUCOSE, CAPILLARY
Glucose-Capillary: 205 mg/dL — ABNORMAL HIGH (ref 70–99)
Glucose-Capillary: 112 mg/dL — ABNORMAL HIGH (ref 70–99)
Glucose-Capillary: 116 mg/dL — ABNORMAL HIGH (ref 70–99)
Glucose-Capillary: 112 mg/dL — ABNORMAL HIGH (ref 70–99)
Glucose-Capillary: 104 mg/dL — ABNORMAL HIGH (ref 70–99)
Glucose-Capillary: 106 mg/dL — ABNORMAL HIGH (ref 70–99)
Glucose-Capillary: 112 mg/dL — ABNORMAL HIGH (ref 70–99)
Glucose-Capillary: 101 mg/dL — ABNORMAL HIGH (ref 70–99)
Glucose-Capillary: 122 mg/dL — ABNORMAL HIGH (ref 70–99)
Glucose-Capillary: 108 mg/dL — ABNORMAL HIGH (ref 70–99)

## 2019-03-25 LAB — POCT I-STAT 7, (LYTES, BLD GAS, ICA,H+H)
Acid-base deficit: 2 mmol/L (ref 0.0–2.0)
Acid-base deficit: 5 mmol/L — ABNORMAL HIGH (ref 0.0–2.0)
Acid-base deficit: 7 mmol/L — ABNORMAL HIGH (ref 0.0–2.0)
Acid-base deficit: 4 mmol/L — ABNORMAL HIGH (ref 0.0–2.0)
Bicarbonate: 23.3 mmol/L (ref 20.0–28.0)
Bicarbonate: 19.5 mmol/L — ABNORMAL LOW (ref 20.0–28.0)
Bicarbonate: 17.5 mmol/L — ABNORMAL LOW (ref 20.0–28.0)
Bicarbonate: 20.8 mmol/L (ref 20.0–28.0)
Calcium, Ion: 1.11 mmol/L — ABNORMAL LOW (ref 1.15–1.40)
Calcium, Ion: 1.26 mmol/L (ref 1.15–1.40)
Calcium, Ion: 1.28 mmol/L (ref 1.15–1.40)
Calcium, Ion: 1.26 mmol/L (ref 1.15–1.40)
HCT: 22 % — ABNORMAL LOW (ref 39.0–52.0)
HCT: 29 % — ABNORMAL LOW (ref 39.0–52.0)
HCT: 28 % — ABNORMAL LOW (ref 39.0–52.0)
HCT: 27 % — ABNORMAL LOW (ref 39.0–52.0)
Hemoglobin: 7.5 g/dL — ABNORMAL LOW (ref 13.0–17.0)
Hemoglobin: 9.9 g/dL — ABNORMAL LOW (ref 13.0–17.0)
Hemoglobin: 9.5 g/dL — ABNORMAL LOW (ref 13.0–17.0)
Hemoglobin: 9.2 g/dL — ABNORMAL LOW (ref 13.0–17.0)
O2 Saturation: 100 %
O2 Saturation: 94 %
O2 Saturation: 93 %
O2 Saturation: 90 %
Patient temperature: 36.3
Patient temperature: 36.5
Patient temperature: 36.2
Potassium: 3.8 mmol/L (ref 3.5–5.1)
Potassium: 4.5 mmol/L (ref 3.5–5.1)
Potassium: 4.2 mmol/L (ref 3.5–5.1)
Potassium: 3.9 mmol/L (ref 3.5–5.1)
Sodium: 140 mmol/L (ref 135–145)
Sodium: 140 mmol/L (ref 135–145)
Sodium: 141 mmol/L (ref 135–145)
Sodium: 142 mmol/L (ref 135–145)
TCO2: 25 mmol/L (ref 22–32)
TCO2: 20 mmol/L — ABNORMAL LOW (ref 22–32)
TCO2: 18 mmol/L — ABNORMAL LOW (ref 22–32)
TCO2: 22 mmol/L (ref 22–32)
pCO2 arterial: 43.6 mmHg (ref 32.0–48.0)
pCO2 arterial: 31.7 mmHg — ABNORMAL LOW (ref 32.0–48.0)
pCO2 arterial: 29.8 mmHg — ABNORMAL LOW (ref 32.0–48.0)
pCO2 arterial: 32.7 mmHg (ref 32.0–48.0)
pH, Arterial: 7.336 — ABNORMAL LOW (ref 7.350–7.450)
pH, Arterial: 7.393 (ref 7.350–7.450)
pH, Arterial: 7.376 (ref 7.350–7.450)
pH, Arterial: 7.407 (ref 7.350–7.450)
pO2, Arterial: 368 mmHg — ABNORMAL HIGH (ref 83.0–108.0)
pO2, Arterial: 68 mmHg — ABNORMAL LOW (ref 83.0–108.0)
pO2, Arterial: 66 mmHg — ABNORMAL LOW (ref 83.0–108.0)
pO2, Arterial: 56 mmHg — ABNORMAL LOW (ref 83.0–108.0)

## 2019-03-25 LAB — POCT I-STAT 4, (NA,K, GLUC, HGB,HCT)
Glucose, Bld: 224 mg/dL — ABNORMAL HIGH (ref 70–99)
Glucose, Bld: 186 mg/dL — ABNORMAL HIGH (ref 70–99)
Glucose, Bld: 148 mg/dL — ABNORMAL HIGH (ref 70–99)
Glucose, Bld: 134 mg/dL — ABNORMAL HIGH (ref 70–99)
Glucose, Bld: 137 mg/dL — ABNORMAL HIGH (ref 70–99)
HCT: 30 % — ABNORMAL LOW (ref 39.0–52.0)
HCT: 29 % — ABNORMAL LOW (ref 39.0–52.0)
HCT: 26 % — ABNORMAL LOW (ref 39.0–52.0)
HCT: 20 % — ABNORMAL LOW (ref 39.0–52.0)
HCT: 20 % — ABNORMAL LOW (ref 39.0–52.0)
Hemoglobin: 10.2 g/dL — ABNORMAL LOW (ref 13.0–17.0)
Hemoglobin: 9.9 g/dL — ABNORMAL LOW (ref 13.0–17.0)
Hemoglobin: 8.8 g/dL — ABNORMAL LOW (ref 13.0–17.0)
Hemoglobin: 6.8 g/dL — CL (ref 13.0–17.0)
Hemoglobin: 6.8 g/dL — CL (ref 13.0–17.0)
Potassium: 3.8 mmol/L (ref 3.5–5.1)
Potassium: 3.9 mmol/L (ref 3.5–5.1)
Potassium: 3.8 mmol/L (ref 3.5–5.1)
Potassium: 4.7 mmol/L (ref 3.5–5.1)
Potassium: 4.6 mmol/L (ref 3.5–5.1)
Sodium: 138 mmol/L (ref 135–145)
Sodium: 138 mmol/L (ref 135–145)
Sodium: 139 mmol/L (ref 135–145)
Sodium: 139 mmol/L (ref 135–145)
Sodium: 141 mmol/L (ref 135–145)

## 2019-03-25 LAB — BASIC METABOLIC PANEL
Anion gap: 9 (ref 5–15)
BUN: 26 mg/dL — ABNORMAL HIGH (ref 8–23)
CO2: 18 mmol/L — ABNORMAL LOW (ref 22–32)
Calcium: 8.4 mg/dL — ABNORMAL LOW (ref 8.9–10.3)
Chloride: 112 mmol/L — ABNORMAL HIGH (ref 98–111)
Creatinine, Ser: 1.81 mg/dL — ABNORMAL HIGH (ref 0.61–1.24)
GFR calc Af Amer: 45 mL/min — ABNORMAL LOW (ref >60)
GFR calc non Af Amer: 39 mL/min — ABNORMAL LOW (ref >60)
Glucose, Bld: 117 mg/dL — ABNORMAL HIGH (ref 70–99)
Potassium: 4.2 mmol/L (ref 3.5–5.1)
Sodium: 139 mmol/L (ref 135–145)

## 2019-03-25 LAB — CBC
HCT: 29.1 % — ABNORMAL LOW (ref 39.0–52.0)
HCT: 30.5 % — ABNORMAL LOW (ref 39.0–52.0)
Hemoglobin: 9.7 g/dL — ABNORMAL LOW (ref 13.0–17.0)
Hemoglobin: 10.4 g/dL — ABNORMAL LOW (ref 13.0–17.0)
MCH: 28.4 pg (ref 26.0–34.0)
MCH: 28.6 pg (ref 26.0–34.0)
MCHC: 33.3 g/dL (ref 30.0–36.0)
MCHC: 34.1 g/dL (ref 30.0–36.0)
MCV: 85.3 fL (ref 80.0–100.0)
MCV: 83.8 fL (ref 80.0–100.0)
Platelets: 149 10*3/uL — ABNORMAL LOW (ref 150–400)
Platelets: 197 10*3/uL (ref 150–400)
RBC: 3.41 MIL/uL — ABNORMAL LOW (ref 4.22–5.81)
RBC: 3.64 MIL/uL — ABNORMAL LOW (ref 4.22–5.81)
RDW: 13.5 % (ref 11.5–15.5)
RDW: 13.7 % (ref 11.5–15.5)
WBC: 11.9 10*3/uL — ABNORMAL HIGH (ref 4.0–10.5)
WBC: 17 10*3/uL — ABNORMAL HIGH (ref 4.0–10.5)
nRBC: 0 % (ref 0.0–0.2)
nRBC: 0 % (ref 0.0–0.2)

## 2019-03-25 LAB — HEMOGLOBIN AND HEMATOCRIT, BLOOD
HCT: 20.7 % — ABNORMAL LOW (ref 39.0–52.0)
Hemoglobin: 7.2 g/dL — ABNORMAL LOW (ref 13.0–17.0)

## 2019-03-25 LAB — PROTIME-INR
INR: 1.4 — ABNORMAL HIGH (ref 0.8–1.2)
Prothrombin Time: 17.1 seconds — ABNORMAL HIGH (ref 11.4–15.2)

## 2019-03-25 LAB — PREPARE RBC (CROSSMATCH)

## 2019-03-25 LAB — HEMOGLOBIN A1C
Hgb A1c MFr Bld: 8.2 % — ABNORMAL HIGH (ref 4.8–5.6)
Mean Plasma Glucose: 188.64 mg/dL

## 2019-03-25 LAB — APTT: aPTT: 32 s (ref 24–36)

## 2019-03-25 LAB — MAGNESIUM: Magnesium: 3.2 mg/dL — ABNORMAL HIGH (ref 1.7–2.4)

## 2019-03-25 LAB — ECHO INTRAOPERATIVE TEE

## 2019-03-25 LAB — PLATELET COUNT: Platelets: 157 10*3/uL (ref 150–400)

## 2019-03-25 SURGERY — CORONARY ARTERY BYPASS GRAFTING (CABG)
Anesthesia: General | Site: Chest

## 2019-03-25 MED ORDER — PROTAMINE SULFATE 10 MG/ML IV SOLN
INTRAVENOUS | Status: DC | PRN
  Administered 2019-03-25: 300 mg via INTRAVENOUS

## 2019-03-25 MED ORDER — ROCURONIUM BROMIDE 10 MG/ML (PF) SYRINGE
PREFILLED_SYRINGE | INTRAVENOUS | Status: AC
  Filled 2019-03-25: qty 10

## 2019-03-25 MED ORDER — HEMOSTATIC AGENTS (NO CHARGE) OPTIME
TOPICAL | Status: DC | PRN
  Administered 2019-03-25: 3 via TOPICAL

## 2019-03-25 MED ORDER — SODIUM CHLORIDE 0.9% FLUSH
3.0000 mL | INTRAVENOUS | Status: DC | PRN
  Administered 2019-03-26: 3 mL via INTRAVENOUS
  Filled 2019-03-25: qty 3

## 2019-03-25 MED ORDER — MIDAZOLAM HCL 2 MG/2ML IJ SOLN
INTRAMUSCULAR | Status: AC
  Filled 2019-03-25: qty 10

## 2019-03-25 MED ORDER — DOCUSATE SODIUM 100 MG PO CAPS
200.0000 mg | ORAL_CAPSULE | Freq: Every day | ORAL | Status: DC
  Administered 2019-03-26 – 2019-03-30 (×5): 200 mg via ORAL
  Filled 2019-03-25 (×5): qty 2

## 2019-03-25 MED ORDER — FENTANYL CITRATE (PF) 250 MCG/5ML IJ SOLN
INTRAMUSCULAR | Status: DC | PRN
  Administered 2019-03-25: 100 ug via INTRAVENOUS
  Administered 2019-03-25: 50 ug via INTRAVENOUS
  Administered 2019-03-25: 100 ug via INTRAVENOUS
  Administered 2019-03-25: 400 ug via INTRAVENOUS

## 2019-03-25 MED ORDER — PANTOPRAZOLE SODIUM 40 MG PO TBEC
40.0000 mg | DELAYED_RELEASE_TABLET | Freq: Every day | ORAL | Status: DC
  Administered 2019-03-27 – 2019-03-30 (×4): 40 mg via ORAL
  Filled 2019-03-25 (×4): qty 1

## 2019-03-25 MED ORDER — SODIUM CHLORIDE 0.9 % IV SOLN: INTRAVENOUS | Status: DC

## 2019-03-25 MED ORDER — CHLORHEXIDINE GLUCONATE 0.12% ORAL RINSE (MEDLINE KIT): 15.0000 mL | Freq: Two times a day (BID) | OROMUCOSAL | Status: DC

## 2019-03-25 MED ORDER — PHENYLEPHRINE 40 MCG/ML (10ML) SYRINGE FOR IV PUSH (FOR BLOOD PRESSURE SUPPORT): PREFILLED_SYRINGE | INTRAVENOUS | Status: DC | PRN

## 2019-03-25 MED ORDER — METOPROLOL TARTRATE 12.5 MG HALF TABLET
12.5000 mg | ORAL_TABLET | Freq: Two times a day (BID) | ORAL | Status: DC
  Administered 2019-03-25 – 2019-03-26 (×3): 12.5 mg via ORAL
  Filled 2019-03-25 (×3): qty 1

## 2019-03-25 MED ORDER — LACTATED RINGERS IV SOLN: INTRAVENOUS | Status: DC

## 2019-03-25 MED ORDER — PROTAMINE SULFATE 10 MG/ML IV SOLN
INTRAVENOUS | Status: AC
  Filled 2019-03-25: qty 25

## 2019-03-25 MED ORDER — SODIUM BICARBONATE 8.4 % IV SOLN
100.0000 meq | Freq: Once | INTRAVENOUS | Status: AC
  Administered 2019-03-25: 100 meq via INTRAVENOUS

## 2019-03-25 MED ORDER — SODIUM CHLORIDE 0.9% FLUSH: 10.0000 mL | INTRAVENOUS | Status: DC | PRN

## 2019-03-25 MED ORDER — CHLORHEXIDINE GLUCONATE CLOTH 2 % EX PADS
6.0000 | MEDICATED_PAD | Freq: Every day | CUTANEOUS | Status: AC
  Administered 2019-03-25 – 2019-03-27 (×2): 6 via TOPICAL

## 2019-03-25 MED ORDER — CHLORHEXIDINE GLUCONATE 0.12 % MT SOLN
OROMUCOSAL | Status: AC
  Administered 2019-03-25: 15 mL via OROMUCOSAL
  Filled 2019-03-25: qty 15

## 2019-03-25 MED ORDER — METOPROLOL TARTRATE 25 MG/10 ML ORAL SUSPENSION: 12.5000 mg | Freq: Two times a day (BID) | ORAL | Status: DC

## 2019-03-25 MED ORDER — METOPROLOL TARTRATE 12.5 MG HALF TABLET
12.5000 mg | ORAL_TABLET | Freq: Once | ORAL | Status: AC
  Administered 2019-03-25: 06:00:00 12.5 mg via ORAL

## 2019-03-25 MED ORDER — VANCOMYCIN HCL IN DEXTROSE 1-5 GM/200ML-% IV SOLN
1000.0000 mg | Freq: Once | INTRAVENOUS | Status: AC
  Administered 2019-03-25: 1000 mg via INTRAVENOUS
  Filled 2019-03-25: qty 200

## 2019-03-25 MED ORDER — ORAL CARE MOUTH RINSE
15.0000 mL | Freq: Two times a day (BID) | OROMUCOSAL | Status: DC
  Administered 2019-03-25 – 2019-03-27 (×4): 15 mL via OROMUCOSAL

## 2019-03-25 MED ORDER — ALBUMIN HUMAN 5 % IV SOLN: 250.0000 mL | INTRAVENOUS | Status: AC | PRN

## 2019-03-25 MED ORDER — ALBUMIN HUMAN 5 % IV SOLN
INTRAVENOUS | Status: DC | PRN
  Administered 2019-03-25 (×2): via INTRAVENOUS

## 2019-03-25 MED ORDER — CHLORHEXIDINE GLUCONATE 4 % EX LIQD: 30.0000 mL | CUTANEOUS | Status: DC

## 2019-03-25 MED ORDER — PROPOFOL 10 MG/ML IV BOLUS
INTRAVENOUS | Status: DC | PRN
  Administered 2019-03-25: 40 mg via INTRAVENOUS

## 2019-03-25 MED ORDER — SODIUM CHLORIDE 0.9 % IV SOLN: 250.0000 mL | INTRAVENOUS | Status: DC

## 2019-03-25 MED ORDER — ROCURONIUM BROMIDE 10 MG/ML (PF) SYRINGE
PREFILLED_SYRINGE | INTRAVENOUS | Status: DC | PRN
  Administered 2019-03-25: 20 mg via INTRAVENOUS
  Administered 2019-03-25 (×2): 50 mg via INTRAVENOUS
  Administered 2019-03-25: 100 mg via INTRAVENOUS

## 2019-03-25 MED ORDER — NITROGLYCERIN IN D5W 200-5 MCG/ML-% IV SOLN: 0.0000 ug/min | INTRAVENOUS | Status: DC

## 2019-03-25 MED ORDER — PHENYLEPHRINE HCL (PRESSORS) 10 MG/ML IV SOLN
INTRAVENOUS | Status: DC | PRN
  Administered 2019-03-25 (×2): 40 ug via INTRAVENOUS

## 2019-03-25 MED ORDER — METOPROLOL TARTRATE 5 MG/5ML IV SOLN
2.5000 mg | INTRAVENOUS | Status: DC | PRN
  Administered 2019-03-26 – 2019-03-28 (×2): 2.5 mg via INTRAVENOUS
  Filled 2019-03-25 (×2): qty 5

## 2019-03-25 MED ORDER — FAMOTIDINE IN NACL 20-0.9 MG/50ML-% IV SOLN
20.0000 mg | Freq: Two times a day (BID) | INTRAVENOUS | Status: AC
  Administered 2019-03-25 (×2): 20 mg via INTRAVENOUS
  Filled 2019-03-25: qty 50

## 2019-03-25 MED ORDER — MIDAZOLAM HCL 2 MG/2ML IJ SOLN: 2.0000 mg | INTRAMUSCULAR | Status: DC | PRN

## 2019-03-25 MED ORDER — HEPARIN SODIUM (PORCINE) 1000 UNIT/ML IJ SOLN
INTRAMUSCULAR | Status: AC
  Filled 2019-03-25: qty 1

## 2019-03-25 MED ORDER — DEXAMETHASONE SODIUM PHOSPHATE 10 MG/ML IJ SOLN
INTRAMUSCULAR | Status: DC | PRN
  Administered 2019-03-25: 5 mg via INTRAVENOUS

## 2019-03-25 MED ORDER — TRAMADOL HCL 50 MG PO TABS
50.0000 mg | ORAL_TABLET | ORAL | Status: DC | PRN
  Administered 2019-03-28: 100 mg via ORAL
  Administered 2019-03-29 – 2019-03-30 (×5): 50 mg via ORAL
  Filled 2019-03-25 (×2): qty 1
  Filled 2019-03-25: qty 2
  Filled 2019-03-25: qty 1
  Filled 2019-03-25: qty 2
  Filled 2019-03-25: qty 1

## 2019-03-25 MED ORDER — MORPHINE SULFATE (PF) 2 MG/ML IV SOLN
1.0000 mg | INTRAVENOUS | Status: DC | PRN
  Administered 2019-03-25 – 2019-03-26 (×4): 2 mg via INTRAVENOUS
  Filled 2019-03-25 (×4): qty 1

## 2019-03-25 MED ORDER — ASPIRIN EC 325 MG PO TBEC
325.0000 mg | DELAYED_RELEASE_TABLET | Freq: Every day | ORAL | Status: DC
  Administered 2019-03-26 – 2019-03-30 (×5): 325 mg via ORAL
  Filled 2019-03-25 (×5): qty 1

## 2019-03-25 MED ORDER — PROTAMINE SULFATE 10 MG/ML IV SOLN
INTRAVENOUS | Status: AC
  Filled 2019-03-25: qty 5

## 2019-03-25 MED ORDER — ASPIRIN 81 MG PO CHEW
324.0000 mg | CHEWABLE_TABLET | Freq: Every day | ORAL | Status: DC
  Filled 2019-03-25: qty 4

## 2019-03-25 MED ORDER — LACTATED RINGERS IV SOLN
INTRAVENOUS | Status: DC
  Administered 2019-03-25: 14:00:00 via INTRAVENOUS
  Administered 2019-03-26: 10 mL/h via INTRAVENOUS
  Administered 2019-03-26: 04:00:00 via INTRAVENOUS

## 2019-03-25 MED ORDER — SODIUM CHLORIDE 0.45 % IV SOLN
INTRAVENOUS | Status: DC | PRN
  Administered 2019-03-25: 14:00:00 via INTRAVENOUS

## 2019-03-25 MED ORDER — SODIUM CHLORIDE 0.9 % IV SOLN
1.5000 g | Freq: Two times a day (BID) | INTRAVENOUS | Status: AC
  Administered 2019-03-25 – 2019-03-27 (×4): 1.5 g via INTRAVENOUS
  Filled 2019-03-25 (×4): qty 1.5

## 2019-03-25 MED ORDER — METOPROLOL TARTRATE 12.5 MG HALF TABLET
ORAL_TABLET | ORAL | Status: AC
  Administered 2019-03-25: 12.5 mg via ORAL
  Filled 2019-03-25: qty 1

## 2019-03-25 MED ORDER — ORAL CARE MOUTH RINSE
15.0000 mL | OROMUCOSAL | Status: DC
  Administered 2019-03-25: 15 mL via OROMUCOSAL

## 2019-03-25 MED ORDER — POTASSIUM CHLORIDE 10 MEQ/50ML IV SOLN: 10.0000 meq | INTRAVENOUS | Status: AC

## 2019-03-25 MED ORDER — PHENYLEPHRINE 40 MCG/ML (10ML) SYRINGE FOR IV PUSH (FOR BLOOD PRESSURE SUPPORT)
PREFILLED_SYRINGE | INTRAVENOUS | Status: AC
  Filled 2019-03-25: qty 10

## 2019-03-25 MED ORDER — OXYCODONE HCL 5 MG PO TABS
5.0000 mg | ORAL_TABLET | ORAL | Status: DC | PRN
  Administered 2019-03-26: 10 mg via ORAL
  Administered 2019-03-26: 5 mg via ORAL
  Administered 2019-03-26: 10 mg via ORAL
  Administered 2019-03-26: 5 mg via ORAL
  Administered 2019-03-27: 10 mg via ORAL
  Administered 2019-03-27: 5 mg via ORAL
  Administered 2019-03-27 – 2019-03-28 (×2): 10 mg via ORAL
  Administered 2019-03-28: 5 mg via ORAL
  Administered 2019-03-28: 10 mg via ORAL
  Filled 2019-03-25 (×2): qty 1
  Filled 2019-03-25 (×4): qty 2
  Filled 2019-03-25 (×2): qty 1
  Filled 2019-03-25 (×2): qty 2

## 2019-03-25 MED ORDER — SODIUM CHLORIDE 0.9% FLUSH
10.0000 mL | Freq: Two times a day (BID) | INTRAVENOUS | Status: DC
  Administered 2019-03-25 – 2019-03-27 (×3): 10 mL

## 2019-03-25 MED ORDER — LACTATED RINGERS IV SOLN
INTRAVENOUS | Status: DC | PRN
  Administered 2019-03-25: 07:00:00 via INTRAVENOUS

## 2019-03-25 MED ORDER — DEXMEDETOMIDINE HCL IN NACL 200 MCG/50ML IV SOLN: 0.0000 ug/kg/h | INTRAVENOUS | Status: DC

## 2019-03-25 MED ORDER — SODIUM CHLORIDE 0.9 % IV SOLN
INTRAVENOUS | Status: DC | PRN
  Administered 2019-03-25: 750 mg via INTRAVENOUS

## 2019-03-25 MED ORDER — MIDAZOLAM HCL 5 MG/5ML IJ SOLN
INTRAMUSCULAR | Status: DC | PRN
  Administered 2019-03-25 (×2): 1 mg via INTRAVENOUS
  Administered 2019-03-25: 5 mg via INTRAVENOUS
  Administered 2019-03-25: 3 mg via INTRAVENOUS

## 2019-03-25 MED ORDER — DEXAMETHASONE SODIUM PHOSPHATE 10 MG/ML IJ SOLN
INTRAMUSCULAR | Status: AC
  Filled 2019-03-25: qty 1

## 2019-03-25 MED ORDER — HEMOSTATIC AGENTS (NO CHARGE) OPTIME
TOPICAL | Status: DC | PRN
  Administered 2019-03-25: 1 via TOPICAL

## 2019-03-25 MED ORDER — HEPARIN SODIUM (PORCINE) 1000 UNIT/ML IJ SOLN
INTRAMUSCULAR | Status: DC | PRN
  Administered 2019-03-25: 30000 [IU] via INTRAVENOUS

## 2019-03-25 MED ORDER — LACTATED RINGERS IV SOLN: 500.0000 mL | Freq: Once | INTRAVENOUS | Status: DC | PRN

## 2019-03-25 MED ORDER — ROSUVASTATIN CALCIUM 5 MG PO TABS
10.0000 mg | ORAL_TABLET | Freq: Every day | ORAL | Status: DC
  Administered 2019-03-26 – 2019-03-29 (×4): 10 mg via ORAL
  Filled 2019-03-25 (×4): qty 2

## 2019-03-25 MED ORDER — SODIUM CHLORIDE 0.9% IV SOLUTION
Freq: Once | INTRAVENOUS | Status: AC
  Administered 2019-03-25: 16:00:00 via INTRAVENOUS

## 2019-03-25 MED ORDER — ESMOLOL HCL 100 MG/10ML IV SOLN
INTRAVENOUS | Status: AC
  Filled 2019-03-25: qty 10

## 2019-03-25 MED ORDER — 0.9 % SODIUM CHLORIDE (POUR BTL) OPTIME
TOPICAL | Status: DC | PRN
  Administered 2019-03-25: 5000 mL

## 2019-03-25 MED ORDER — DEXMEDETOMIDINE HCL IN NACL 400 MCG/100ML IV SOLN
0.0000 ug/kg/h | INTRAVENOUS | Status: DC
  Administered 2019-03-25: 0.5 ug/kg/h via INTRAVENOUS
  Administered 2019-03-25: 0.7 ug/kg/h via INTRAVENOUS
  Filled 2019-03-25: qty 100

## 2019-03-25 MED ORDER — NICARDIPINE HCL IN NACL 20-0.86 MG/200ML-% IV SOLN
3.0000 mg/h | INTRAVENOUS | Status: DC
  Administered 2019-03-25: 3 mg/h via INTRAVENOUS
  Administered 2019-03-25: 5 mg/h via INTRAVENOUS
  Administered 2019-03-26 (×3): 7.5 mg/h via INTRAVENOUS
  Filled 2019-03-25 (×8): qty 200

## 2019-03-25 MED ORDER — CHLORHEXIDINE GLUCONATE 0.12 % MT SOLN
15.0000 mL | Freq: Once | OROMUCOSAL | Status: AC
  Administered 2019-03-25: 06:00:00 15 mL via OROMUCOSAL

## 2019-03-25 MED ORDER — CHLORHEXIDINE GLUCONATE CLOTH 2 % EX PADS
6.0000 | MEDICATED_PAD | Freq: Every day | CUTANEOUS | Status: DC
  Administered 2019-03-25 – 2019-03-28 (×3): 6 via TOPICAL

## 2019-03-25 MED ORDER — BISACODYL 5 MG PO TBEC
10.0000 mg | DELAYED_RELEASE_TABLET | Freq: Every day | ORAL | Status: DC
  Administered 2019-03-26 – 2019-03-29 (×4): 10 mg via ORAL
  Filled 2019-03-25 (×5): qty 2

## 2019-03-25 MED ORDER — ACETAMINOPHEN 650 MG RE SUPP
650.0000 mg | Freq: Once | RECTAL | Status: AC
  Administered 2019-03-25: 650 mg via RECTAL

## 2019-03-25 MED ORDER — PROPOFOL 10 MG/ML IV BOLUS
INTRAVENOUS | Status: AC
  Filled 2019-03-25: qty 20

## 2019-03-25 MED ORDER — INSULIN REGULAR(HUMAN) IN NACL 100-0.9 UT/100ML-% IV SOLN
INTRAVENOUS | Status: DC
  Administered 2019-03-25: 2.1 [IU]/h via INTRAVENOUS
  Filled 2019-03-25: qty 100

## 2019-03-25 MED ORDER — ONDANSETRON HCL 4 MG/2ML IJ SOLN
4.0000 mg | Freq: Four times a day (QID) | INTRAMUSCULAR | Status: DC | PRN
  Administered 2019-03-25 – 2019-03-28 (×4): 4 mg via INTRAVENOUS
  Filled 2019-03-25 (×4): qty 2

## 2019-03-25 MED ORDER — ACETAMINOPHEN 500 MG PO TABS
1000.0000 mg | ORAL_TABLET | Freq: Four times a day (QID) | ORAL | Status: DC
  Administered 2019-03-25 – 2019-03-30 (×16): 1000 mg via ORAL
  Filled 2019-03-25 (×17): qty 2

## 2019-03-25 MED ORDER — MAGNESIUM SULFATE 4 GM/100ML IV SOLN
4.0000 g | Freq: Once | INTRAVENOUS | Status: AC
  Administered 2019-03-25: 4 g via INTRAVENOUS
  Filled 2019-03-25: qty 100

## 2019-03-25 MED ORDER — CHLORHEXIDINE GLUCONATE 0.12 % MT SOLN
15.0000 mL | OROMUCOSAL | Status: AC
  Administered 2019-03-25: 15 mL via OROMUCOSAL

## 2019-03-25 MED ORDER — NOREPINEPHRINE 4 MG/250ML-% IV SOLN: 0.0000 ug/min | INTRAVENOUS | Status: DC

## 2019-03-25 MED ORDER — INSULIN REGULAR BOLUS VIA INFUSION
0.0000 [IU] | Freq: Three times a day (TID) | INTRAVENOUS | Status: DC
  Filled 2019-03-25: qty 10

## 2019-03-25 MED ORDER — MUPIROCIN 2 % EX OINT
1.0000 "application " | TOPICAL_OINTMENT | Freq: Two times a day (BID) | CUTANEOUS | Status: AC
  Administered 2019-03-25 – 2019-03-30 (×10): 1 via NASAL
  Filled 2019-03-25 (×4): qty 22

## 2019-03-25 MED ORDER — LACTATED RINGERS IV BOLUS
500.0000 mL | Freq: Once | INTRAVENOUS | Status: AC
  Administered 2019-03-25: 500 mL via INTRAVENOUS

## 2019-03-25 MED ORDER — NOREPINEPHRINE BITARTRATE 1 MG/ML IV SOLN
INTRAVENOUS | Status: DC | PRN
  Administered 2019-03-25: 5 ug/min via INTRAVENOUS

## 2019-03-25 MED ORDER — FENTANYL CITRATE (PF) 250 MCG/5ML IJ SOLN
INTRAMUSCULAR | Status: AC
  Filled 2019-03-25: qty 25

## 2019-03-25 MED ORDER — ACETAMINOPHEN 160 MG/5ML PO SOLN: 1000.0000 mg | Freq: Four times a day (QID) | ORAL | Status: DC

## 2019-03-25 MED ORDER — ACETAMINOPHEN 160 MG/5ML PO SOLN: 650.0000 mg | Freq: Once | ORAL | Status: AC

## 2019-03-25 MED ORDER — BISACODYL 10 MG RE SUPP: 10.0000 mg | Freq: Every day | RECTAL | Status: DC

## 2019-03-25 MED ORDER — SODIUM CHLORIDE 0.9% FLUSH
3.0000 mL | Freq: Two times a day (BID) | INTRAVENOUS | Status: DC
  Administered 2019-03-26 – 2019-03-29 (×7): 3 mL via INTRAVENOUS

## 2019-03-25 SURGICAL SUPPLY — 103 items
BAG DECANTER FOR FLEXI CONT (MISCELLANEOUS) ×3 IMPLANT
BASKET HEART (ORDER IN 25'S) (MISCELLANEOUS) ×1
BASKET HEART (ORDER IN 25S) (MISCELLANEOUS) ×2 IMPLANT
BLADE 11 SAFETY STRL DISP (BLADE) ×3 IMPLANT
BLADE CLIPPER SURG (BLADE) IMPLANT
BLADE STERNUM SYSTEM 6 (BLADE) ×3 IMPLANT
BNDG ELASTIC 4X5.8 VLCR STR LF (GAUZE/BANDAGES/DRESSINGS) ×3 IMPLANT
BNDG ELASTIC 6X5.8 VLCR STR LF (GAUZE/BANDAGES/DRESSINGS) ×3 IMPLANT
BNDG GAUZE ELAST 4 BULKY (GAUZE/BANDAGES/DRESSINGS) ×3 IMPLANT
CANISTER SUCT 3000ML PPV (MISCELLANEOUS) ×3 IMPLANT
CANNULA EZ GLIDE 8.0 24FR (CANNULA) ×6 IMPLANT
CANNULA MC2 2 STG 29/37 NON-V (CANNULA) ×2 IMPLANT
CANNULA MC2 TWO STAGE (CANNULA) ×1
CATH CPB KIT HENDRICKSON (MISCELLANEOUS) ×3 IMPLANT
CLIP RETRACTION 3.0MM CORONARY (MISCELLANEOUS) ×3 IMPLANT
CLIP VESOCCLUDE MED 24/CT (CLIP) IMPLANT
CLIP VESOCCLUDE SM WIDE 24/CT (CLIP) IMPLANT
CONN ST 1/2X1/2  BEN (MISCELLANEOUS) ×1
CONN ST 1/2X1/2 BEN (MISCELLANEOUS) ×2 IMPLANT
CONNECTOR BLAKE 2:1 CARIO BLK (MISCELLANEOUS) ×6 IMPLANT
COVER WAND RF STERILE (DRAPES) IMPLANT
DRAIN CHANNEL 19F RND (DRAIN) ×9 IMPLANT
DRAIN CONNECTOR BLAKE 1:1 (MISCELLANEOUS) ×3 IMPLANT
DRAPE CARDIOVASCULAR INCISE (DRAPES) ×1
DRAPE HALF SHEET 40X57 (DRAPES) ×3 IMPLANT
DRAPE INCISE IOBAN 66X45 STRL (DRAPES) ×3 IMPLANT
DRAPE SLUSH/WARMER DISC (DRAPES) ×3 IMPLANT
DRAPE SRG 135X102X78XABS (DRAPES) ×2 IMPLANT
DRSG COVADERM 4X14 (GAUZE/BANDAGES/DRESSINGS) ×3 IMPLANT
ELECT REM PT RETURN 9FT ADLT (ELECTROSURGICAL) ×6
ELECTRODE REM PT RTRN 9FT ADLT (ELECTROSURGICAL) ×4 IMPLANT
EVACUATOR SILICONE 100CC (DRAIN) ×9 IMPLANT
FELT TEFLON 1X6 (MISCELLANEOUS) ×3 IMPLANT
GAUZE SPONGE 4X4 12PLY STRL (GAUZE/BANDAGES/DRESSINGS) ×6 IMPLANT
GLOVE BIO SURGEON STRL SZ 6.5 (GLOVE) ×12 IMPLANT
GLOVE BIO SURGEON STRL SZ7 (GLOVE) ×6 IMPLANT
GLOVE BIOGEL PI IND STRL 6.5 (GLOVE) ×4 IMPLANT
GLOVE BIOGEL PI IND STRL 7.5 (GLOVE) IMPLANT
GLOVE BIOGEL PI INDICATOR 6.5 (GLOVE) ×2
GLOVE BIOGEL PI INDICATOR 7.5 (GLOVE)
GLOVE SKINSENSE NS SZ7.0 (GLOVE) ×1
GLOVE SKINSENSE STRL SZ6.0 (GLOVE) ×3 IMPLANT
GLOVE SKINSENSE STRL SZ7.0 (GLOVE) ×2 IMPLANT
GOWN STRL REUS W/ TWL LRG LVL3 (GOWN DISPOSABLE) ×20 IMPLANT
GOWN STRL REUS W/ TWL XL LVL3 (GOWN DISPOSABLE) ×4 IMPLANT
GOWN STRL REUS W/TWL LRG LVL3 (GOWN DISPOSABLE) ×10
GOWN STRL REUS W/TWL XL LVL3 (GOWN DISPOSABLE) ×2
HEMOSTAT POWDER SURGIFOAM 1G (HEMOSTASIS) ×9 IMPLANT
KIT BASIN OR (CUSTOM PROCEDURE TRAY) ×3 IMPLANT
KIT DRAINAGE VACCUM ASSIST (KITS) ×3 IMPLANT
KIT SUCTION CATH 14FR (SUCTIONS) ×3 IMPLANT
KIT TURNOVER KIT B (KITS) ×3 IMPLANT
KIT VASOVIEW HEMOPRO 2 VH 4000 (KITS) ×3 IMPLANT
LEAD PACING MYOCARDI (MISCELLANEOUS) ×3 IMPLANT
MARKER GRAFT CORONARY BYPASS (MISCELLANEOUS) ×9 IMPLANT
NS IRRIG 1000ML POUR BTL (IV SOLUTION) ×15 IMPLANT
PACK E OPEN HEART (SUTURE) ×3 IMPLANT
PACK OPEN HEART (CUSTOM PROCEDURE TRAY) ×3 IMPLANT
PAD ARMBOARD 7.5X6 YLW CONV (MISCELLANEOUS) ×6 IMPLANT
PAD ELECT DEFIB RADIOL ZOLL (MISCELLANEOUS) ×3 IMPLANT
PENCIL BUTTON HOLSTER BLD 10FT (ELECTRODE) ×6 IMPLANT
POSITIONER HEAD DONUT 9IN (MISCELLANEOUS) ×3 IMPLANT
PUNCH AORTIC ROT 4.0MM RCL 40 (MISCELLANEOUS) ×3 IMPLANT
PUNCH AORTIC ROTATE 4.0MM (MISCELLANEOUS) IMPLANT
PUNCH AORTIC ROTATE 4.5MM 8IN (MISCELLANEOUS) IMPLANT
PUNCH AORTIC ROTATE 5MM 8IN (MISCELLANEOUS) IMPLANT
SET CARDIOPLEGIA MPS 5001102 (MISCELLANEOUS) ×3 IMPLANT
SOL ANTI FOG 6CC (MISCELLANEOUS) ×2 IMPLANT
SOLUTION ANTI FOG 6CC (MISCELLANEOUS) ×1
SPONGE LAP 18X18 RF (DISPOSABLE) ×3 IMPLANT
SPONGE LAP 4X18 RFD (DISPOSABLE) IMPLANT
SUT BONE WAX W31G (SUTURE) ×3 IMPLANT
SUT ETHIBOND X763 2 0 SH 1 (SUTURE) ×6 IMPLANT
SUT MNCRL AB 3-0 PS2 18 (SUTURE) ×6 IMPLANT
SUT MNCRL AB 4-0 PS2 18 (SUTURE) ×3 IMPLANT
SUT PDS AB 1 CTX 36 (SUTURE) ×6 IMPLANT
SUT PROLENE 2 0 SH DA (SUTURE) IMPLANT
SUT PROLENE 3 0 SH DA (SUTURE) ×3 IMPLANT
SUT PROLENE 3 0 SH1 36 (SUTURE) IMPLANT
SUT PROLENE 4 0 RB 1 (SUTURE) ×1
SUT PROLENE 4 0 SH DA (SUTURE) ×9 IMPLANT
SUT PROLENE 4-0 RB1 .5 CRCL 36 (SUTURE) ×2 IMPLANT
SUT PROLENE 5 0 C 1 36 (SUTURE) ×9 IMPLANT
SUT PROLENE 6 0 C 1 30 (SUTURE) IMPLANT
SUT PROLENE 7 0 BV1 MDA (SUTURE) ×6 IMPLANT
SUT PROLENE 8 0 BV175 6 (SUTURE) IMPLANT
SUT PROLENE BLUE 7 0 (SUTURE) ×3 IMPLANT
SUT PROLENE POLY MONO (SUTURE) IMPLANT
SUT STEEL 6MS V (SUTURE) IMPLANT
SUT STEEL SZ 6 DBL 3X14 BALL (SUTURE) ×9 IMPLANT
SUT VIC AB 2-0 CT1 27 (SUTURE) ×1
SUT VIC AB 2-0 CT1 TAPERPNT 27 (SUTURE) ×2 IMPLANT
SYR 20ML LL LF (SYRINGE) ×3 IMPLANT
SYSTEM SAHARA CHEST DRAIN ATS (WOUND CARE) ×3 IMPLANT
TAPE CLOTH SURG 4X10 WHT LF (GAUZE/BANDAGES/DRESSINGS) ×3 IMPLANT
TAPE PAPER 2X10 WHT MICROPORE (GAUZE/BANDAGES/DRESSINGS) ×3 IMPLANT
TOWEL GREEN STERILE (TOWEL DISPOSABLE) ×3 IMPLANT
TOWEL GREEN STERILE FF (TOWEL DISPOSABLE) ×3 IMPLANT
TRAY FOLEY SLVR 16FR TEMP STAT (SET/KITS/TRAYS/PACK) ×3 IMPLANT
TUBE CONNECTING 20X1/4 (TUBING) ×3 IMPLANT
TUBING LAP HI FLOW INSUFFLATIO (TUBING) ×3 IMPLANT
UNDERPAD 30X30 (UNDERPADS AND DIAPERS) ×3 IMPLANT
WATER STERILE IRR 1000ML POUR (IV SOLUTION) ×6 IMPLANT

## 2019-03-25 NOTE — Anesthesia Procedure Notes (Signed)
Arterial Line Insertion Start/End10/05/2019 6:45 AM Performed by: Trinna Post., CRNA, CRNA  Patient location: Pre-op. Preanesthetic checklist: patient identified, IV checked, site marked, risks and benefits discussed, surgical consent, monitors and equipment checked, pre-op evaluation, timeout performed and anesthesia consent Lidocaine 1% used for infiltration and patient sedated Left, radial was placed Catheter size: 20 G Hand hygiene performed  and maximum sterile barriers used   Attempts: 1 Procedure performed without using ultrasound guided technique. Following insertion, dressing applied and Biopatch. Post procedure assessment: normal  Patient tolerated the procedure well with no immediate complications.

## 2019-03-25 NOTE — Anesthesia Procedure Notes (Signed)
Central Venous Catheter Insertion Performed by: Duane Boston, MD, anesthesiologist Start/End10/05/2019 6:56 AM, 03/25/2019 7:06 AM Patient location: Pre-op. Preanesthetic checklist: patient identified, IV checked, site marked, risks and benefits discussed, surgical consent, monitors and equipment checked, pre-op evaluation, timeout performed and anesthesia consent Position: Trendelenburg Lidocaine 1% used for infiltration and patient sedated Hand hygiene performed , maximum sterile barriers used  and Seldinger technique used Catheter size: 8.5 Fr Total catheter length 8. PA cath was placed.Sheath introducer Swan type:thermodilution PA Cath depth:50 Procedure performed using ultrasound guided technique. Ultrasound Notes:anatomy identified, needle tip was noted to be adjacent to the nerve/plexus identified, no ultrasound evidence of intravascular and/or intraneural injection and image(s) printed for medical record Attempts: 1 Following insertion, line sutured, dressing applied and Biopatch. Post procedure assessment: free fluid flow, blood return through all ports and no air  Patient tolerated the procedure well with no immediate complications.

## 2019-03-25 NOTE — Brief Op Note (Signed)
03/25/2019  1:15 PM  PATIENT:  [name]  63 y.o. male  PRE-OPERATIVE DIAGNOSIS:  CORONARY ARTERY DISEASE  POST-OPERATIVE DIAGNOSIS:  CORONARY ARTERY DISEASE  PROCEDURE:  Procedure(s): CORONARY ARTERY BYPASS GRAFTING (CABG) X  4 USING LEFT INTERNAL MAMMARY ARTERY AND RIGHT SAPHENOUS VEIN GRAFTS (N/A) TRANSESOPHAGEAL ECHOCARDIOGRAM (TEE) (N/A)   LIMA to LAD SVG to OM2 SVG Diag1 SVG to PL   SURGEON:  Surgeon(s) and Role:    * Lightfoot, Lucile Crater, MD - Primary    * Melrose Nakayama, MD - Assisting  PHYSICIAN ASSISTANT:  Nicholes Rough, PA-C  ANESTHESIA:   general  EBL:  720 mL   BLOOD ADMINISTERED:none  DRAINS: ROUTINE   LOCAL MEDICATIONS USED:  NONE  SPECIMEN:  No Specimen  DISPOSITION OF SPECIMEN:  N/A  COUNTS:  YES  DICTATION: .Dragon Dictation  PLAN OF CARE: Admit to inpatient   PATIENT DISPOSITION:  ICU - intubated and hemodynamically stable.   Delay start of Pharmacological VTE agent (>24hrs) due to surgical blood loss or risk of bleeding: yes

## 2019-03-25 NOTE — H&P (Signed)
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  [name]  Per my last note  [facility] Record #[id number]  Date of Birth: June 15, 1955  Referring: [name], MD  Primary Care: [name], MD  Primary Cardiologist:Suresh Bronson Ing, MD  Chief Complaint: No chief complaint on file.  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 He states that for the past year he has noticed some problems with his overall health. He has had significant edema, and exertional dyspnea. After being placed on medications, he has had some improvement, but continues to have some fatigue, and dyspnea. He is no longer able to lay flat in bed, and often has a dry cough.  He also describes some radicular pain to both upper extremities that is positional depending on how he lays in bed.  Past Medical and Surgical History:  Previous Chest Surgery: no  Previous Chest Radiation: no  Diabetes Mellitus: yes. HbA1C 6.7 per report  Creatinine: 2.03      Past Medical History:  Diagnosis Date  . Diabetes mellitus without complication ([facility])   . Hypertension         Past Surgical History:  Procedure Laterality Date  . EXCISION MORTON'S NEUROMA    . RIGHT/LEFT HEART CATH AND CORONARY ANGIOGRAPHY N/A 03/12/2019   Procedure: RIGHT/LEFT HEART CATH AND CORONARY ANGIOGRAPHY; Surgeon: [name], MD; Location: [facility]; Service: Cardiovascular; Laterality: N/A;  Social History:  Support: recently married.  Social History       Tobacco Use  Smoking Status Former Smoker  . Quit date: 07/06/1988  . Years since quitting: 30.7  Smokeless Tobacco Never Used   Social  History       Substance and Sexual Activity  Alcohol Use Not Currently   Comment: socially       Allergies  Allergen Reactions  . Bee Venom Anaphylaxis  Medications:  Asprin: yes  Statin: yes  Beta Blocker: yes  Ace Inhibitor: no  Anti-Coagulation: no        Current Outpatient Medications  Medication Sig Dispense Refill  . acetaminophen (TYLENOL) 500 MG tablet Take 1,000 mg by mouth every 6 (six) hours as needed for moderate pain or headache.    Marland Kitchen aspirin 81 MG chewable tablet Chew 1 tablet (81 mg total) by mouth daily. (Patient not taking: Reported on 03/11/2019) 30 tablet 0  . aspirin EC 81 MG tablet Take 81 mg by mouth daily.    . carvedilol (COREG) 12.5 MG tablet TAKE 1 TABLET BY MOUTH TWICE A DAY WITH A MEAL (Patient taking differently: Take 12.5 mg by mouth 2 (two) times daily with a meal. ) 50 tablet 3  . Cholecalciferol (VITAMIN D3) 50 MCG (2000 UT) capsule Take 2,000 Units by mouth daily.    . Coenzyme Q10 (COQ-10) 400 MG CAPS Take 400 mg by mouth daily.    . Cyanocobalamin (B-12) 2500 MCG TABS Take 2,500 mcg by mouth daily.    . diphenhydramine-acetaminophen (TYLENOL PM) 25-500 MG TABS tablet Take 1 tablet by mouth at bedtime as needed (sleep).    . furosemide (  LASIX) 40 MG tablet Take 40 mg am (Patient taking differently: Take 40 mg by mouth daily. ) 90 tablet 3  . glipiZIDE (GLUCOTROL) 10 MG tablet TAKE ONE TABLET BY MOUTH TWICE A DAY (Patient taking differently: Take 10 mg by mouth 2 (two) times daily. ) 180 tablet 0  . hydrALAZINE (APRESOLINE) 25 MG tablet Take 75 mg by mouth 2 (two) times daily.    . hydrALAZINE (APRESOLINE) 50 MG tablet Take 1 tablet (50 mg total) by mouth 3 (three) times daily. 270 tablet 3  . loratadine (CLARITIN) 10 MG tablet Take 10 mg by mouth daily as needed for allergies.    . Misc Natural Products (OSTEO BI-FLEX TRIPLE STRENGTH PO) Take 1 tablet by mouth daily.    . Multiple Vitamin (MULTIVITAMIN WITH MINERALS) TABS tablet Take 1 tablet by  mouth daily.    . nitroGLYCERIN (NITROSTAT) 0.4 MG SL tablet Place 1 tablet (0.4 mg total) under the tongue every 5 (five) minutes as needed. 25 tablet 3  . rosuvastatin (CRESTOR) 5 MG tablet Take 1 tablet (5 mg total) by mouth daily. 90 tablet 3  . trolamine salicylate (ASPERCREME) 10 % cream Apply 1 application topically as needed for muscle pain.     No current facility-administered medications for this visit.   (Not in a hospital admission)       Family History  Problem Relation Age of Onset  . Cancer Mother   . Heart Problems Father   Review of Systems:  Review of Systems  Constitutional: Positive for malaise/fatigue and weight loss. Negative for chills, diaphoresis and fever.  HENT: Positive for sore throat.  Eyes: Positive for blurred vision.  Respiratory: Positive for cough and shortness of breath. Negative for sputum production.  Cardiovascular: Positive for orthopnea, claudication and leg swelling. Negative for chest pain.  Gastrointestinal: Positive for constipation and heartburn.  Musculoskeletal: Positive for joint pain, myalgias and neck pain.  Skin: Negative.  Neurological: Negative.  Psychiatric/Behavioral: The patient is nervous/anxious.   Physical Exam:  There were no vitals taken for this visit.  Physical Exam  Constitutional: He is oriented to person, place, and time and well-developed, well-nourished, and in no distress. No distress.  HENT:  Head: Normocephalic and atraumatic.  Mouth/Throat: No oropharyngeal exudate.  Eyes: Conjunctivae and EOM are normal.  Neck: Normal range of motion. No tracheal deviation present.  Cardiovascular: Normal rate, regular rhythm and normal heart sounds.  No murmur heard.  Pulmonary/Chest: Effort normal and breath sounds normal. No respiratory distress.  Abdominal: Soft. He exhibits no distension.  Musculoskeletal: Normal range of motion.  General: No edema.  Neurological: He is alert and oriented to person, place, and time.   Skin: Skin is warm and dry. He is not diaphoretic.  Psychiatric: Affect normal.   Diagnostic Studies & Laboratory data:  Left Heart Catherization:  Mid LAD lesion is 80% stenosed.  Ost LAD to Mid LAD lesion is 50% stenosed.  1st Diag lesion is 80% stenosed.  2nd Diag lesion is 75% stenosed.  Mid LM to Dist LM lesion is 25% stenosed.  Prox Cx lesion is 90% stenosed.  1st Mrg lesion is 80% stenosed.  2nd Mrg lesion is 80% stenosed.  Dist RCA lesion is 90% stenosed.  Mid RCA lesion is 75% stenosed.  RPAV lesion is 80% stenosed.  LV end diastolic pressure is moderately elevated.  There is no aortic valve stenosis.  Hemodynamic findings consistent with mild pulmonary hypertension. Echo:  1. Left ventricular ejection fraction, by visual  estimation, is 45 to 50%. The left ventricle has mildly decreased function. There is mildly increased left ventricular hypertrophy.  2. Left ventricular diastolic Doppler parameters are consistent with impaired relaxation pattern of LV diastolic filling.  3. Global right ventricle has normal systolic function.The right ventricular size is normal. No increase in right ventricular wall thickness.  4. Left atrial size was severely dilated.  5. Right atrial size was normal.  6. Mild mitral annular calcification.  7. Moderate calcification of the mitral valve leaflet(s).  8. Moderate thickening of the mitral valve leaflet(s).  9. The mitral valve is abnormal. Trace mitral valve regurgitation. No evidence of mitral stenosis.  10. The tricuspid valve is normal in structure. Tricuspid valve regurgitation is trivial.  11. The aortic valve is tricuspid Aortic valve regurgitation was not visualized by color flow Doppler. Mild aortic valve sclerosis without stenosis.  12. The pulmonic valve was not well visualized. Pulmonic valve regurgitation is mild by color flow Doppler.  13. Mildly elevated pulmonary artery systolic pressure.  I have independently reviewed the  above radiologic studies and discussed with the patient  Recent Lab Findings:  Recent Labs                                                                                                                            Assessment / Plan:  63 yo male with 3V CAD, and EF 45%. No evidence of valvular disease. Risks and benefits of coronary artery bypass grafting have been discussed, and he is agreeable to proceed with surgery. He has a good OM2, and PDA target. The LAD target is somewhat small, but he has a descent D1 target that covers a large distribution as well. Both will be bypassed. He is tentatively scheduled for 10/12, CABGX 4.   Nakul Avino [name]

## 2019-03-25 NOTE — Procedures (Signed)
Extubation Procedure Note  Patient Details:   Name: [name] DOB: 28-Aug-1955 MRN: [id number]   Airway Documentation:    Vent end date: 03/25/19 Vent end time: 1815   Evaluation  O2 sats: stable throughout Complications: No apparent complications Patient did tolerate procedure well. Bilateral Breath Sounds: Clear   Yes    Patient had positive cuff leak. VC was .80 and NIF was -20. Patient was pulling 600-1L on vent during wean. Extubated to 4 LPM nasal cannula per ABG results. Patient had strong cough. No stridor noted. Able to speak post extubation. Vitals are stable. IS was 750. RN at bedside. RT will continue to monitor.    Glinda Natzke M 03/25/2019, 6:26 PM

## 2019-03-25 NOTE — Progress Notes (Addendum)
EVENING ROUNDS NOTE :     [address]       [place] [address]             [phone]                 Day of Surgery Procedure(s) (LRB): CORONARY ARTERY BYPASS GRAFTING (CABG) X  4 USING LEFT INTERNAL MAMMARY ARTERY AND RIGHT SAPHENOUS VEIN GRAFTS (N/A) TRANSESOPHAGEAL ECHOCARDIOGRAM (TEE) (N/A)  Total Length of Stay:  LOS: 0 days  BP (!) 141/58   Pulse 69   Temp 97.7 F (36.5 C)   Resp (!) 21   Ht 5\' 10"  (1.778 m)   SpO2 97%   BMI 30.82 kg/m   .Intake/Output      10/12 0701 - 10/13 0700   I.V. 2096.4   Blood 1040   IV Piggyback 695.3   Total Intake 3831.7   Urine 840   Blood 720   Chest Tube 190   Total Output 1750   Net +2081.7         . sodium chloride 20 mL/hr at 03/25/19 1900  . [START ON 03/26/2019] sodium chloride    . sodium chloride    . albumin human    . cefUROXime (ZINACEF)  IV Stopped (03/25/19 1450)  . dexmedetomidine (PRECEDEX) IV infusion Stopped (03/25/19 1652)  . famotidine (PEPCID) IV 20 mg (03/25/19 2137)  . insulin 2.1 mL/hr at 03/25/19 1900  . lactated ringers    . lactated ringers    . lactated ringers 20 mL/hr at 03/25/19 1900  . niCARDipine 1 mg/hr (03/25/19 1900)  . nitroGLYCERIN    . norepinephrine (LEVOPHED) Adult infusion       Lab Results  Component Value Date   WBC 17.0 (H) 03/25/2019   HGB 9.2 (L) 03/25/2019   HCT 27.0 (L) 03/25/2019   PLT 197 03/25/2019   GLUCOSE 117 (H) 03/25/2019   CHOL 152 12/31/2018   TRIG 172 (H) 12/31/2018   HDL 41 12/31/2018   LDLCALC 77 12/31/2018   ALT 20 03/21/2019   AST 19 03/21/2019   NA 142 03/25/2019   K 3.9 03/25/2019   CL 112 (H) 03/25/2019   CREATININE 1.81 (H) 03/25/2019   BUN 26 (H) 03/25/2019   CO2 18 (L) 03/25/2019   TSH 2.110 04/29/2018   INR 1.4 (H) 03/25/2019   HGBA1C 8.2 (H) 03/25/2019    Extubated... correcting acidosis NSR on Cardene at 1 CT output low, Hgb stable at 9.2 Pain controlled  Ellwood Handler, PA-C      03/25/2019 9:59 PM

## 2019-03-25 NOTE — Anesthesia Postprocedure Evaluation (Signed)
Anesthesia Post Note  Patient: [name]  Procedure(s) Performed: CORONARY ARTERY BYPASS GRAFTING (CABG) X  4 USING LEFT INTERNAL MAMMARY ARTERY AND RIGHT SAPHENOUS VEIN GRAFTS (N/A Chest) TRANSESOPHAGEAL ECHOCARDIOGRAM (TEE) (N/A )     Patient location during evaluation: SICU Anesthesia Type: General Level of consciousness: sedated and patient remains intubated per anesthesia plan Pain management: pain level controlled Vital Signs Assessment: post-procedure vital signs reviewed and stable Respiratory status: patient remains intubated per anesthesia plan and patient on ventilator - see flowsheet for VS Cardiovascular status: stable Anesthetic complications: no    Last Vitals:  Vitals:   03/25/19 1615 03/25/19 1630  BP:    Pulse: 60 61  Resp: 17 15  Temp: (!) 35.9 C (!) 36 C  SpO2: 96% 97%    Last Pain:  Vitals:   03/25/19 1345  TempSrc: Core  PainSc:                  Nolon Nations

## 2019-03-25 NOTE — Transfer of Care (Signed)
Immediate Anesthesia Transfer of Care Note  Patient: [name]  Procedure(s) Performed: CORONARY ARTERY BYPASS GRAFTING (CABG) X  4 USING LEFT INTERNAL MAMMARY ARTERY AND RIGHT SAPHENOUS VEIN GRAFTS (N/A Chest) TRANSESOPHAGEAL ECHOCARDIOGRAM (TEE) (N/A )  Patient Location: PACU and ICU  Anesthesia Type:General  Level of Consciousness: Patient remains intubated per anesthesia plan  Airway & Oxygen Therapy: Patient remains intubated per anesthesia plan and Patient placed on Ventilator (see vital sign flow sheet for setting)  Post-op Assessment: Report given to RN and Post -op Vital signs reviewed and stable  Post vital signs: Reviewed and stable  Last Vitals:  Vitals Value Taken Time  BP    Temp    Pulse    Resp    SpO2      Last Pain:  Vitals:   03/25/19 0611  TempSrc:   PainSc: 0-No pain      Patients Stated Pain Goal: 3 (15/05/69 7948)  Complications: No apparent anesthesia complications

## 2019-03-25 NOTE — Progress Notes (Signed)
Echocardiogram 2D Echocardiogram has been performed.  [name] 03/25/2019, 8:17 AM

## 2019-03-25 NOTE — Anesthesia Procedure Notes (Signed)
Procedure Name: Intubation Date/Time: 03/25/2019 7:57 AM Performed by: Trinna Post., CRNA Pre-anesthesia Checklist: Patient identified, Emergency Drugs available, Suction available, Patient being monitored and Timeout performed Patient Re-evaluated:Patient Re-evaluated prior to induction Oxygen Delivery Method: Circle system utilized Preoxygenation: Pre-oxygenation with 100% oxygen Induction Type: IV induction Ventilation: Mask ventilation without difficulty Laryngoscope Size: Mac and 4 Grade View: Grade I Tube type: Oral Tube size: 7.5 mm Number of attempts: 1 Airway Equipment and Method: Stylet Placement Confirmation: ETT inserted through vocal cords under direct vision,  positive ETCO2 and breath sounds checked- equal and bilateral Secured at: 21 cm Tube secured with: Tape Dental Injury: Teeth and Oropharynx as per pre-operative assessment

## 2019-03-25 NOTE — Op Note (Signed)
[address]       [place], [address]             [phone]                                          03/25/2019 Patient:  [name] Pre-Op Dx: 3V CAD   Post-op Dx:  same Procedure: CABG X 4.  LIMA LAD, RSVG PLV, OM2, D1   Endoscopic greater saphenous vein harvest on the right Intra-operative Transesophageal Echocardiogram  Surgeon and Role:      * Naamah Boggess, Lucile Crater, MD - Primary    * Dr. Roxan Hockey, MD - assisting   Assistant: T. Harriet Pho, PA-C  Anesthesia  general EBL:  545ml Blood Administration: 2 units of blood Xclamp Time:  65 min Pump Time:  189min  Drains: 19 F blake drain:  R, L, mediastinal  Wires: none Counts: correct   Indications: 63 yo male with 3V CAD, and EF 45%. No evidence of valvular disease. Risks and benefits of coronary artery bypass grafting have been discussed, and he is agreeable to proceed with surgery  Findings: Good conduit, good targets.  Good flows.    Operative Technique: All invasive lines were placed in pre-op holding.  After the risks, benefits and alternatives were thoroughly discussed, the patient was brought to the operative theatre.  Anesthesia was induced, and the patient was prepped and draped in normal sterile fashion.  An appropriate surgical pause was performed, and pre-operative antibiotics were dosed accordingly.  We began with simultaneous incisions were made along the right leg for harvesting of the greater saphenous vein and the chest for the sternotomy.  In regards to the sternotomy, this was carried down with bovie cautery, and the sternum was divided with a reciprocating saw.  Meticulous hemostasis was obtained.  The left internal thoracic artery was exposed and harvested in in pedicled fashion.  The patient was systemically heparinized, and the artery was divided distally, and placed in a papaverine sponge.    The sternal elevator was removed, and a retractor was placed.  The pericardium  was divided in the midline and fashioned into a cradle with pericardial stitches.   After we confirmed an appropriate ACT, the ascending aorta was cannulated in standard fashion.  The right atrial appendage was used for venous cannulation site.  Cardiopulmonary bypass was initiated, and the heart retractor was placed. The cross clamp was applied, and a dose of anterograde cardioplegia was given with good arrest of the heart.  We moved to the posterior wall of the heart, and found a good target on the PLV.  An arteriotomy was made, and the vein graft was anastomosed to it in an end to side fashion.  Next we exposed the lateral wall, and found a good target on the OM2.  An end to side anastomosis with the vein graft was then created.  Next, we exposed the anterior wall of the heart and identified a good target on 1st diagonal.   An arteriotomy was created.  The vein was anastomosed in an end to side fashion.  Finally, we exposed a good target on the  LAD, and fashioned an end to side anastomosis between it and the LITA.  We began to re-warm, and a re-animation dose of cardioplegia was given.  The heart was de-aired, and the cross clamp was  removed.  Meticulous hemostasis was obtained.    A partial occludding clamp was then placed on the ascending aorta, and we created an end to side anastomosis between it and the proximal vein grafts.  The proximal sites were marked with rings.  Hemostasis was obtained, and we separated from cardiopulmonary bypass without event.the heparin was reversed with protamine.  Chest tubes and wires were placed, and the sternum was re-approximated with with sternal wires.  The soft tissue and skin were re-approximated wth absorbable suture.    The patient tolerated the procedure without any immediate complications, and was transferred to the ICU in guarded condition.  [name] [name]

## 2019-03-26 ENCOUNTER — Encounter (HOSPITAL_COMMUNITY): Payer: Self-pay | Admitting: Thoracic Surgery (Cardiothoracic Vascular Surgery)

## 2019-03-26 ENCOUNTER — Inpatient Hospital Stay (HOSPITAL_COMMUNITY): Payer: Medicaid Other

## 2019-03-26 LAB — BASIC METABOLIC PANEL
Anion gap: 9 (ref 5–15)
Anion gap: 9 (ref 5–15)
BUN: 27 mg/dL — ABNORMAL HIGH (ref 8–23)
BUN: 28 mg/dL — ABNORMAL HIGH (ref 8–23)
CO2: 21 mmol/L — ABNORMAL LOW (ref 22–32)
CO2: 20 mmol/L — ABNORMAL LOW (ref 22–32)
Calcium: 8.3 mg/dL — ABNORMAL LOW (ref 8.9–10.3)
Calcium: 8.4 mg/dL — ABNORMAL LOW (ref 8.9–10.3)
Chloride: 110 mmol/L (ref 98–111)
Chloride: 111 mmol/L (ref 98–111)
Creatinine, Ser: 1.94 mg/dL — ABNORMAL HIGH (ref 0.61–1.24)
Creatinine, Ser: 1.99 mg/dL — ABNORMAL HIGH (ref 0.61–1.24)
GFR calc Af Amer: 42 mL/min — ABNORMAL LOW (ref >60)
GFR calc Af Amer: 41 mL/min — ABNORMAL LOW (ref >60)
GFR calc non Af Amer: 36 mL/min — ABNORMAL LOW (ref >60)
GFR calc non Af Amer: 35 mL/min — ABNORMAL LOW (ref >60)
Glucose, Bld: 114 mg/dL — ABNORMAL HIGH (ref 70–99)
Glucose, Bld: 149 mg/dL — ABNORMAL HIGH (ref 70–99)
Potassium: 4 mmol/L (ref 3.5–5.1)
Potassium: 4 mmol/L (ref 3.5–5.1)
Sodium: 140 mmol/L (ref 135–145)
Sodium: 140 mmol/L (ref 135–145)

## 2019-03-26 LAB — GLUCOSE, CAPILLARY
Glucose-Capillary: 104 mg/dL — ABNORMAL HIGH (ref 70–99)
Glucose-Capillary: 106 mg/dL — ABNORMAL HIGH (ref 70–99)
Glucose-Capillary: 108 mg/dL — ABNORMAL HIGH (ref 70–99)
Glucose-Capillary: 112 mg/dL — ABNORMAL HIGH (ref 70–99)
Glucose-Capillary: 114 mg/dL — ABNORMAL HIGH (ref 70–99)
Glucose-Capillary: 114 mg/dL — ABNORMAL HIGH (ref 70–99)
Glucose-Capillary: 115 mg/dL — ABNORMAL HIGH (ref 70–99)
Glucose-Capillary: 117 mg/dL — ABNORMAL HIGH (ref 70–99)
Glucose-Capillary: 117 mg/dL — ABNORMAL HIGH (ref 70–99)
Glucose-Capillary: 128 mg/dL — ABNORMAL HIGH (ref 70–99)
Glucose-Capillary: 128 mg/dL — ABNORMAL HIGH (ref 70–99)
Glucose-Capillary: 130 mg/dL — ABNORMAL HIGH (ref 70–99)
Glucose-Capillary: 133 mg/dL — ABNORMAL HIGH (ref 70–99)
Glucose-Capillary: 146 mg/dL — ABNORMAL HIGH (ref 70–99)
Glucose-Capillary: 153 mg/dL — ABNORMAL HIGH (ref 70–99)
Glucose-Capillary: 80 mg/dL (ref 70–99)

## 2019-03-26 LAB — CBC
HCT: 27.9 % — ABNORMAL LOW (ref 39.0–52.0)
HCT: 27.7 % — ABNORMAL LOW (ref 39.0–52.0)
Hemoglobin: 9.5 g/dL — ABNORMAL LOW (ref 13.0–17.0)
Hemoglobin: 9.2 g/dL — ABNORMAL LOW (ref 13.0–17.0)
MCH: 28.5 pg (ref 26.0–34.0)
MCH: 28.7 pg (ref 26.0–34.0)
MCHC: 34.1 g/dL (ref 30.0–36.0)
MCHC: 33.2 g/dL (ref 30.0–36.0)
MCV: 83.8 fL (ref 80.0–100.0)
MCV: 86.3 fL (ref 80.0–100.0)
Platelets: 165 10*3/uL (ref 150–400)
Platelets: 174 10*3/uL (ref 150–400)
RBC: 3.33 MIL/uL — ABNORMAL LOW (ref 4.22–5.81)
RBC: 3.21 MIL/uL — ABNORMAL LOW (ref 4.22–5.81)
RDW: 14 % (ref 11.5–15.5)
RDW: 14.6 % (ref 11.5–15.5)
WBC: 14.9 10*3/uL — ABNORMAL HIGH (ref 4.0–10.5)
WBC: 13.7 10*3/uL — ABNORMAL HIGH (ref 4.0–10.5)
nRBC: 0 % (ref 0.0–0.2)
nRBC: 0 % (ref 0.0–0.2)

## 2019-03-26 LAB — TYPE AND SCREEN
ABO/RH(D): B POS
Antibody Screen: NEGATIVE
Unit division: 0
Unit division: 0

## 2019-03-26 LAB — POCT I-STAT 7, (LYTES, BLD GAS, ICA,H+H)
Acid-base deficit: 6 mmol/L — ABNORMAL HIGH (ref 0.0–2.0)
Bicarbonate: 19.3 mmol/L — ABNORMAL LOW (ref 20.0–28.0)
Calcium, Ion: 1.3 mmol/L (ref 1.15–1.40)
HCT: 27 % — ABNORMAL LOW (ref 39.0–52.0)
Hemoglobin: 9.2 g/dL — ABNORMAL LOW (ref 13.0–17.0)
O2 Saturation: 94 %
Patient temperature: 36.3
Potassium: 4.4 mmol/L (ref 3.5–5.1)
Sodium: 140 mmol/L (ref 135–145)
TCO2: 20 mmol/L — ABNORMAL LOW (ref 22–32)
pCO2 arterial: 34 mmHg (ref 32.0–48.0)
pH, Arterial: 7.359 (ref 7.350–7.450)
pO2, Arterial: 71 mmHg — ABNORMAL LOW (ref 83.0–108.0)

## 2019-03-26 LAB — BPAM RBC
Blood Product Expiration Date: 202011092359
Blood Product Expiration Date: 202011092359
ISSUE DATE / TIME: 202010121120
ISSUE DATE / TIME: 202010121120
Unit Type and Rh: 7300
Unit Type and Rh: 7300

## 2019-03-26 LAB — MAGNESIUM
Magnesium: 2.8 mg/dL — ABNORMAL HIGH (ref 1.7–2.4)
Magnesium: 2.7 mg/dL — ABNORMAL HIGH (ref 1.7–2.4)

## 2019-03-26 MED ORDER — INSULIN ASPART 100 UNIT/ML ~~LOC~~ SOLN
0.0000 [IU] | SUBCUTANEOUS | Status: DC
  Administered 2019-03-26 (×4): 2 [IU] via SUBCUTANEOUS
  Administered 2019-03-27 (×2): 4 [IU] via SUBCUTANEOUS
  Administered 2019-03-27: 2 [IU] via SUBCUTANEOUS
  Administered 2019-03-27: 4 [IU] via SUBCUTANEOUS
  Administered 2019-03-27: 8 [IU] via SUBCUTANEOUS
  Administered 2019-03-28 (×3): 4 [IU] via SUBCUTANEOUS
  Administered 2019-03-29: 8 [IU] via SUBCUTANEOUS
  Administered 2019-03-29: 2 [IU] via SUBCUTANEOUS
  Administered 2019-03-29 (×2): 4 [IU] via SUBCUTANEOUS

## 2019-03-26 MED ORDER — PROMETHAZINE HCL 25 MG/ML IJ SOLN
25.0000 mg | Freq: Four times a day (QID) | INTRAMUSCULAR | Status: DC | PRN
  Administered 2019-03-26: 25 mg via INTRAVENOUS
  Filled 2019-03-26 (×2): qty 1

## 2019-03-26 MED FILL — Albumin, Human Inj 5%: INTRAVENOUS | Qty: 250 | Status: AC

## 2019-03-26 MED FILL — Lidocaine HCl Local Preservative Free (PF) Inj 2%: INTRAMUSCULAR | Qty: 15 | Status: AC

## 2019-03-26 MED FILL — Potassium Chloride Inj 2 mEq/ML: INTRAVENOUS | Qty: 40 | Status: AC

## 2019-03-26 MED FILL — Electrolyte-R (PH 7.4) Solution: INTRAVENOUS | Qty: 5000 | Status: AC

## 2019-03-26 MED FILL — Heparin Sodium (Porcine) Inj 1000 Unit/ML: INTRAMUSCULAR | Qty: 20 | Status: AC

## 2019-03-26 MED FILL — Sodium Chloride IV Soln 0.9%: INTRAVENOUS | Qty: 2000 | Status: AC

## 2019-03-26 MED FILL — Mannitol IV Soln 20%: INTRAVENOUS | Qty: 500 | Status: AC

## 2019-03-26 MED FILL — Magnesium Sulfate Inj 50%: INTRAMUSCULAR | Qty: 10 | Status: AC

## 2019-03-26 MED FILL — Calcium Chloride Inj 10%: INTRAVENOUS | Qty: 10 | Status: AC

## 2019-03-26 MED FILL — Sodium Bicarbonate IV Soln 8.4%: INTRAVENOUS | Qty: 50 | Status: AC

## 2019-03-26 MED FILL — Heparin Sodium (Porcine) Inj 1000 Unit/ML: INTRAMUSCULAR | Qty: 30 | Status: AC

## 2019-03-26 NOTE — Progress Notes (Signed)
      [address]       [place] [address]             [phone]                 1 Day Post-Op Procedure(s) (LRB): CORONARY ARTERY BYPASS GRAFTING (CABG) X  4 USING LEFT INTERNAL MAMMARY ARTERY AND RIGHT SAPHENOUS VEIN GRAFTS (N/A) TRANSESOPHAGEAL ECHOCARDIOGRAM (TEE) (N/A)   Events: Extubated.  Some nausea _______________________________________________________________ Vitals: BP 115/63   Pulse 90   Temp (!) 97 F (36.1 C)   Resp (!) 26   Ht 5\' 10"  (1.778 m)   Wt 103.6 kg   SpO2 92%   BMI 32.77 kg/m   - Neuro: alert NAD  - Cardiovascular: sinus  Drips: Cardene 7.5 PAP: (24-39)/(9-29) 35/23 CO:  [3.8 L/min-6.4 L/min] 5.4 L/min CI:  [1.8 L/min/m2-3 L/min/m2] 2.5 L/min/m2  - Pulm: EWOB, clear  ABG    Component Value Date/Time   PHART 7.407 03/25/2019 2110   PCO2ART 32.7 03/25/2019 2110   PO2ART 56.0 (L) 03/25/2019 2110   HCO3 20.8 03/25/2019 2110   TCO2 22 03/25/2019 2110   ACIDBASEDEF 4.0 (H) 03/25/2019 2110   O2SAT 90.0 03/25/2019 2110    - Abd: soft - Extremity: trace edema  .Intake/Output      10/12 0701 - 10/13 0700 10/13 0701 - 10/14 0700   I.V. (mL/kg) 3255.8 (31.4) 379.5 (3.7)   Blood 1040    IV Piggyback 1546.2    Total Intake(mL/kg) 5842 (56.4) 379.5 (3.7)   Urine (mL/kg/hr) 1485 (0.6) 40 (0.1)   Blood 720    Chest Tube 590 30   Total Output 2795 70   Net +3047 +309.5           _______________________________________________________________ Labs: CBC Latest Ref Rng & Units 03/26/2019 03/25/2019 03/25/2019  WBC 4.0 - 10.5 K/uL 14.9(H) - -  Hemoglobin 13.0 - 17.0 g/dL 9.5(L) 9.2(L) 9.5(L)  Hematocrit 39.0 - 52.0 % 27.9(L) 27.0(L) 28.0(L)  Platelets 150 - 400 K/uL 165 - -   CMP Latest Ref Rng & Units 03/26/2019 03/25/2019 03/25/2019  Glucose 70 - 99 mg/dL 114(H) - -  BUN 8 - 23 mg/dL 27(H) - -  Creatinine 0.61 - 1.24 mg/dL 1.94(H) - -  Sodium 135 - 145 mmol/L 140 142 141  Potassium 3.5 - 5.1 mmol/L 4.0 3.9 4.2   Chloride 98 - 111 mmol/L 110 - -  CO2 22 - 32 mmol/L 21(L) - -  Calcium 8.9 - 10.3 mg/dL 8.3(L) - -  Total Protein 6.5 - 8.1 g/dL - - -  Total Bilirubin 0.3 - 1.2 mg/dL - - -  Alkaline Phos 38 - 126 U/L - - -  AST 15 - 41 U/L - - -  ALT 0 - 44 U/L - - -    CXR: clear  _______________________________________________________________  Assessment and Plan: POD 1 s/p CABG  Neuro: pain control CV: remove A line, and swan.  On A/S/BB.  Will titrate blood pressure Pulm: pulm toilet Renal: creat stable GI: NPO for now.  Added phenergan Heme: stable ID: [email or web address].  WBC trending down Endo: SSI Dispo: continue ICU care for now.  Melodie Bouillon, MD 03/26/2019 12:26 PM

## 2019-03-26 NOTE — Plan of Care (Signed)
  Problem: Respiratory: Goal: Respiratory status will improve Outcome: Progressing   Problem: Clinical Measurements: Goal: Postoperative complications will be avoided or minimized Outcome: Progressing   Problem: Clinical Measurements: Goal: Cardiovascular complication will be avoided Outcome: Progressing

## 2019-03-26 NOTE — Plan of Care (Signed)
  Problem: Education: Goal: Will demonstrate proper wound care and an understanding of methods to prevent future damage Outcome: Progressing Goal: Knowledge of disease or condition will improve Outcome: Progressing Goal: Knowledge of the prescribed therapeutic regimen will improve Outcome: Progressing Goal: Individualized Educational Video(s) Outcome: Progressing   Problem: Activity: Goal: Risk for activity intolerance will decrease Outcome: Progressing   Problem: Cardiac: Goal: Will achieve and/or maintain hemodynamic stability Outcome: Progressing   Problem: Clinical Measurements: Goal: Postoperative complications will be avoided or minimized Outcome: Progressing   Problem: Respiratory: Goal: Respiratory status will improve Outcome: Progressing   Problem: Skin Integrity: Goal: Wound healing without signs and symptoms of infection Outcome: Progressing Goal: Risk for impaired skin integrity will decrease Outcome: Progressing   Problem: Urinary Elimination: Goal: Ability to achieve and maintain adequate renal perfusion and functioning will improve Outcome: Progressing   Problem: Education: Goal: Knowledge of General Education information will improve Description: Including pain rating scale, medication(s)/side effects and non-pharmacologic comfort measures Outcome: Progressing   Problem: Health Behavior/Discharge Planning: Goal: Ability to manage health-related needs will improve Outcome: Progressing   Problem: Clinical Measurements: Goal: Ability to maintain clinical measurements within normal limits will improve Outcome: Progressing Goal: Will remain free from infection Outcome: Progressing Goal: Diagnostic test results will improve Outcome: Progressing Goal: Respiratory complications will improve Outcome: Progressing Goal: Cardiovascular complication will be avoided Outcome: Progressing   Problem: Activity: Goal: Risk for activity intolerance will  decrease Outcome: Progressing   Problem: Nutrition: Goal: Adequate nutrition will be maintained Outcome: Progressing   Problem: Coping: Goal: Level of anxiety will decrease Outcome: Progressing   Problem: Elimination: Goal: Will not experience complications related to bowel motility Outcome: Progressing Goal: Will not experience complications related to urinary retention Outcome: Progressing   Problem: Pain Managment: Goal: General experience of comfort will improve Outcome: Progressing   Problem: Safety: Goal: Ability to remain free from injury will improve Outcome: Progressing   Problem: Skin Integrity: Goal: Risk for impaired skin integrity will decrease Outcome: Progressing   Problem: Education: Goal: Will demonstrate proper wound care and an understanding of methods to prevent future damage Outcome: Progressing Goal: Knowledge of disease or condition will improve Outcome: Progressing Goal: Knowledge of the prescribed therapeutic regimen will improve Outcome: Progressing Goal: Individualized Educational Video(s) Outcome: Progressing   Problem: Activity: Goal: Risk for activity intolerance will decrease Outcome: Progressing   Problem: Cardiac: Goal: Will achieve and/or maintain hemodynamic stability Outcome: Progressing   Problem: Clinical Measurements: Goal: Postoperative complications will be avoided or minimized Outcome: Progressing   Problem: Respiratory: Goal: Respiratory status will improve Outcome: Progressing   Problem: Skin Integrity: Goal: Wound healing without signs and symptoms of infection Outcome: Progressing Goal: Risk for impaired skin integrity will decrease Outcome: Progressing   Problem: Urinary Elimination: Goal: Ability to achieve and maintain adequate renal perfusion and functioning will improve Outcome: Progressing

## 2019-03-27 ENCOUNTER — Inpatient Hospital Stay (HOSPITAL_COMMUNITY): Payer: Medicaid Other

## 2019-03-27 LAB — GLUCOSE, CAPILLARY
Glucose-Capillary: 134 mg/dL — ABNORMAL HIGH (ref 70–99)
Glucose-Capillary: 169 mg/dL — ABNORMAL HIGH (ref 70–99)
Glucose-Capillary: 199 mg/dL — ABNORMAL HIGH (ref 70–99)
Glucose-Capillary: 206 mg/dL — ABNORMAL HIGH (ref 70–99)
Glucose-Capillary: 180 mg/dL — ABNORMAL HIGH (ref 70–99)

## 2019-03-27 LAB — BASIC METABOLIC PANEL
Anion gap: 8 (ref 5–15)
BUN: 28 mg/dL — ABNORMAL HIGH (ref 8–23)
CO2: 22 mmol/L (ref 22–32)
Calcium: 8.5 mg/dL — ABNORMAL LOW (ref 8.9–10.3)
Chloride: 111 mmol/L (ref 98–111)
Creatinine, Ser: 2.08 mg/dL — ABNORMAL HIGH (ref 0.61–1.24)
GFR calc Af Amer: 38 mL/min — ABNORMAL LOW (ref >60)
GFR calc non Af Amer: 33 mL/min — ABNORMAL LOW (ref >60)
Glucose, Bld: 140 mg/dL — ABNORMAL HIGH (ref 70–99)
Potassium: 4.3 mmol/L (ref 3.5–5.1)
Sodium: 141 mmol/L (ref 135–145)

## 2019-03-27 LAB — CBC
HCT: 26.9 % — ABNORMAL LOW (ref 39.0–52.0)
Hemoglobin: 8.7 g/dL — ABNORMAL LOW (ref 13.0–17.0)
MCH: 28.4 pg (ref 26.0–34.0)
MCHC: 32.3 g/dL (ref 30.0–36.0)
MCV: 87.9 fL (ref 80.0–100.0)
Platelets: 177 10*3/uL (ref 150–400)
RBC: 3.06 MIL/uL — ABNORMAL LOW (ref 4.22–5.81)
RDW: 14.7 % (ref 11.5–15.5)
WBC: 12 10*3/uL — ABNORMAL HIGH (ref 4.0–10.5)
nRBC: 0 % (ref 0.0–0.2)

## 2019-03-27 MED ORDER — MOVING RIGHT ALONG BOOK
Freq: Once | Status: AC
  Administered 2019-03-27: 11:00:00
  Filled 2019-03-27: qty 1

## 2019-03-27 MED ORDER — SODIUM CHLORIDE 0.9% FLUSH: 3.0000 mL | INTRAVENOUS | Status: DC | PRN

## 2019-03-27 MED ORDER — SODIUM CHLORIDE 0.9% FLUSH
3.0000 mL | Freq: Two times a day (BID) | INTRAVENOUS | Status: DC
  Administered 2019-03-27 – 2019-03-29 (×4): 3 mL via INTRAVENOUS

## 2019-03-27 MED ORDER — METOPROLOL TARTRATE 25 MG PO TABS
25.0000 mg | ORAL_TABLET | Freq: Two times a day (BID) | ORAL | Status: DC
  Administered 2019-03-27 – 2019-03-28 (×3): 25 mg via ORAL
  Filled 2019-03-27 (×3): qty 1

## 2019-03-27 MED ORDER — POTASSIUM CHLORIDE CRYS ER 20 MEQ PO TBCR
40.0000 meq | EXTENDED_RELEASE_TABLET | Freq: Once | ORAL | Status: AC
  Administered 2019-03-27: 40 meq via ORAL
  Filled 2019-03-27: qty 2

## 2019-03-27 MED ORDER — FUROSEMIDE 40 MG PO TABS
40.0000 mg | ORAL_TABLET | Freq: Every day | ORAL | Status: DC
  Administered 2019-03-27: 40 mg via ORAL
  Filled 2019-03-27: qty 1

## 2019-03-27 MED ORDER — SODIUM CHLORIDE 0.9 % IV SOLN: 250.0000 mL | INTRAVENOUS | Status: DC | PRN

## 2019-03-27 NOTE — Progress Notes (Signed)
Pt transferred to 4E-10 via wheelchair from [place] with RN. PT walked to chair. Tele applied, CCMD notified. Pt oriented to call bell, room and bed. Call bell within reach. VSS. Will continue to monitor. Amanda Cockayne, RN

## 2019-03-27 NOTE — Progress Notes (Signed)
      [address]       [place] [address]             [phone]                 2 Days Post-Op Procedure(s) (LRB): CORONARY ARTERY BYPASS GRAFTING (CABG) X  4 USING LEFT INTERNAL MAMMARY ARTERY AND RIGHT SAPHENOUS VEIN GRAFTS (N/A) TRANSESOPHAGEAL ECHOCARDIOGRAM (TEE) (N/A)   Events: Doing well.  Nausea improve _______________________________________________________________ Vitals: BP (!) 157/86   Pulse 87   Temp 98.1 F (36.7 C) (Oral)   Resp (!) 25   Ht 5\' 10"  (1.778 m)   Wt 103.3 kg   SpO2 93%   BMI 32.68 kg/m   - Neuro: alert NAD  - Cardiovascular: sinus   - Pulm: EWOB, clear  ABG    Component Value Date/Time   PHART 7.407 03/25/2019 2110   PCO2ART 32.7 03/25/2019 2110   PO2ART 56.0 (L) 03/25/2019 2110   HCO3 20.8 03/25/2019 2110   TCO2 22 03/25/2019 2110   ACIDBASEDEF 4.0 (H) 03/25/2019 2110   O2SAT 90.0 03/25/2019 2110    - Abd: soft - Extremity: trace edema  .Intake/Output      10/13 0701 - 10/14 0700 10/14 0701 - 10/15 0700   P.O. 100    I.V. (mL/kg) 1339.6 (13)    Blood     IV Piggyback 199.9    Total Intake(mL/kg) 1639.5 (15.9)    Urine (mL/kg/hr) 410 (0.2)    Blood     Chest Tube 475    Total Output 885    Net +754.5            _______________________________________________________________ Labs: CBC Latest Ref Rng & Units 03/27/2019 03/26/2019 03/26/2019  WBC 4.0 - 10.5 K/uL 12.0(H) 13.7(H) 14.9(H)  Hemoglobin 13.0 - 17.0 g/dL 8.7(L) 9.2(L) 9.5(L)  Hematocrit 39.0 - 52.0 % 26.9(L) 27.7(L) 27.9(L)  Platelets 150 - 400 K/uL 177 174 165   CMP Latest Ref Rng & Units 03/27/2019 03/26/2019 03/26/2019  Glucose 70 - 99 mg/dL 140(H) 149(H) 114(H)  BUN 8 - 23 mg/dL 28(H) 28(H) 27(H)  Creatinine 0.61 - 1.24 mg/dL 2.08(H) 1.99(H) 1.94(H)  Sodium 135 - 145 mmol/L 141 140 140  Potassium 3.5 - 5.1 mmol/L 4.3 4.0 4.0  Chloride 98 - 111 mmol/L 111 111 110  CO2 22 - 32 mmol/L 22 20(L) 21(L)  Calcium 8.9 - 10.3 mg/dL  8.5(L) 8.4(L) 8.3(L)  Total Protein 6.5 - 8.1 g/dL - - -  Total Bilirubin 0.3 - 1.2 mg/dL - - -  Alkaline Phos 38 - 126 U/L - - -  AST 15 - 41 U/L - - -  ALT 0 - 44 U/L - - -    CXR: clear  _______________________________________________________________  Assessment and Plan: POD 2 s/p CABG  Neuro: pain control CV:  On A/S/BB.  Will titrate blood pressure Pulm: pulm toilet Renal: creat stable, adding gentle diuresis  ZS:[id number] diet Heme: stable ID: afebrile.  WBC trending down Endo: SSI Dispo: floor today  Melodie Bouillon, MD 03/27/2019 8:33 AM

## 2019-03-28 LAB — CBC
HCT: 26.5 % — ABNORMAL LOW (ref 39.0–52.0)
Hemoglobin: 8.5 g/dL — ABNORMAL LOW (ref 13.0–17.0)
MCH: 27.9 pg (ref 26.0–34.0)
MCHC: 32.1 g/dL (ref 30.0–36.0)
MCV: 86.9 fL (ref 80.0–100.0)
Platelets: 179 10*3/uL (ref 150–400)
RBC: 3.05 MIL/uL — ABNORMAL LOW (ref 4.22–5.81)
RDW: 14.5 % (ref 11.5–15.5)
WBC: 11 10*3/uL — ABNORMAL HIGH (ref 4.0–10.5)
nRBC: 0 % (ref 0.0–0.2)

## 2019-03-28 LAB — GLUCOSE, CAPILLARY
Glucose-Capillary: 102 mg/dL — ABNORMAL HIGH (ref 70–99)
Glucose-Capillary: 108 mg/dL — ABNORMAL HIGH (ref 70–99)
Glucose-Capillary: 114 mg/dL — ABNORMAL HIGH (ref 70–99)
Glucose-Capillary: 171 mg/dL — ABNORMAL HIGH (ref 70–99)
Glucose-Capillary: 190 mg/dL — ABNORMAL HIGH (ref 70–99)
Glucose-Capillary: 193 mg/dL — ABNORMAL HIGH (ref 70–99)

## 2019-03-28 LAB — BASIC METABOLIC PANEL
Anion gap: 7 (ref 5–15)
BUN: 34 mg/dL — ABNORMAL HIGH (ref 8–23)
CO2: 23 mmol/L (ref 22–32)
Calcium: 8.4 mg/dL — ABNORMAL LOW (ref 8.9–10.3)
Chloride: 109 mmol/L (ref 98–111)
Creatinine, Ser: 2.17 mg/dL — ABNORMAL HIGH (ref 0.61–1.24)
GFR calc Af Amer: 36 mL/min — ABNORMAL LOW (ref >60)
GFR calc non Af Amer: 31 mL/min — ABNORMAL LOW (ref >60)
Glucose, Bld: 111 mg/dL — ABNORMAL HIGH (ref 70–99)
Potassium: 4.5 mmol/L (ref 3.5–5.1)
Sodium: 139 mmol/L (ref 135–145)

## 2019-03-28 MED ORDER — AMLODIPINE BESYLATE 5 MG PO TABS: 2.5000 mg | ORAL_TABLET | Freq: Every day | ORAL | Status: DC

## 2019-03-28 MED ORDER — AMLODIPINE BESYLATE 10 MG PO TABS: 10.0000 mg | ORAL_TABLET | Freq: Every day | ORAL | Status: DC

## 2019-03-28 MED ORDER — CARVEDILOL 25 MG PO TABS
25.0000 mg | ORAL_TABLET | Freq: Two times a day (BID) | ORAL | Status: DC
  Administered 2019-03-28 – 2019-03-30 (×4): 25 mg via ORAL
  Filled 2019-03-28 (×4): qty 1

## 2019-03-28 MED ORDER — HYDRALAZINE HCL 50 MG PO TABS
75.0000 mg | ORAL_TABLET | Freq: Two times a day (BID) | ORAL | Status: DC
  Administered 2019-03-28: 75 mg via ORAL
  Filled 2019-03-28: qty 1

## 2019-03-28 MED ORDER — AMLODIPINE BESYLATE 2.5 MG PO TABS
2.5000 mg | ORAL_TABLET | Freq: Every day | ORAL | Status: DC
  Administered 2019-03-28: 2.5 mg via ORAL
  Filled 2019-03-28: qty 1

## 2019-03-28 MED ORDER — CARVEDILOL 12.5 MG PO TABS: 12.5000 mg | ORAL_TABLET | Freq: Two times a day (BID) | ORAL | Status: DC

## 2019-03-28 MED ORDER — AMLODIPINE BESYLATE 5 MG PO TABS
5.0000 mg | ORAL_TABLET | Freq: Every day | ORAL | Status: DC
  Administered 2019-03-29: 5 mg via ORAL
  Filled 2019-03-28: qty 1

## 2019-03-28 MED ORDER — HYDRALAZINE HCL 50 MG PO TABS
100.0000 mg | ORAL_TABLET | Freq: Two times a day (BID) | ORAL | Status: DC
  Administered 2019-03-28 – 2019-03-30 (×4): 100 mg via ORAL
  Filled 2019-03-28 (×4): qty 2

## 2019-03-28 NOTE — Progress Notes (Signed)
Patient ambulated in hallway with nursing staff. Patient tolerated well back in room will monitor patient. Sahiti Joswick, Bettina Gavia rN

## 2019-03-28 NOTE — Progress Notes (Addendum)
      [address]       [place] [address]             [phone]      3 Days Post-Op Procedure(s) (LRB): CORONARY ARTERY BYPASS GRAFTING (CABG) X  4 USING LEFT INTERNAL MAMMARY ARTERY AND RIGHT SAPHENOUS VEIN GRAFTS (N/A) TRANSESOPHAGEAL ECHOCARDIOGRAM (TEE) (N/A) Subjective: Pain control has been an issue as well as nausea. His nausea has improved. Hypertensive.   Objective: Vital signs in last 24 hours: Temp:  [98 F (36.7 C)-98.9 F (37.2 C)] 98.4 F (36.9 C) (10/15 0800) Pulse Rate:  [74-108] 91 (10/15 0800) Cardiac Rhythm: Normal sinus rhythm;Bundle branch block (10/15 0700) Resp:  [14-26] 18 (10/15 0800) BP: (123-190)/(63-105) 175/98 (10/15 0800) SpO2:  [91 %-95 %] 94 % (10/15 0800) Weight:  [103 kg] 103 kg (10/15 0500)     Intake/Output from previous day: 10/14 0701 - 10/15 0700 In: 660 [P.O.:660] Out: 440 [Urine:250; Chest Tube:190] Intake/Output this shift: No intake/output data recorded.  General appearance: alert, cooperative and no distress Heart: sinus tachycardia Lungs: clear to auscultation bilaterally Abdomen: soft, non-tender; bowel sounds normal; no masses,  no organomegaly Extremities: extremities normal, atraumatic, no cyanosis or edema Wound: clean, some drainage from the lower portion of his sternum, recently dressed.  Lab Results: Recent Labs    03/27/19 0259 03/28/19 0316  WBC 12.0* 11.0*  HGB 8.7* 8.5*  HCT 26.9* 26.5*  PLT 177 179   BMET:  Recent Labs    03/27/19 0259 03/28/19 0316  NA 141 139  K 4.3 4.5  CL 111 109  CO2 22 23  GLUCOSE 140* 111*  BUN 28* 34*  CREATININE 2.08* 2.17*  CALCIUM 8.5* 8.4*    PT/INR:  Recent Labs    03/25/19 1340  LABPROT 17.1*  INR 1.4*   ABG    Component Value Date/Time   PHART 7.407 03/25/2019 2110   HCO3 20.8 03/25/2019 2110   TCO2 22 03/25/2019 2110   ACIDBASEDEF 4.0 (H) 03/25/2019 2110   O2SAT 90.0 03/25/2019 2110   CBG (last 3)  Recent Labs    03/28/19  0017 03/28/19 0420 03/28/19 0657  GLUCAP 102* 108* 114*    Assessment/Plan: S/P Procedure(s) (LRB): CORONARY ARTERY BYPASS GRAFTING (CABG) X  4 USING LEFT INTERNAL MAMMARY ARTERY AND RIGHT SAPHENOUS VEIN GRAFTS (N/A) TRANSESOPHAGEAL ECHOCARDIOGRAM (TEE) (N/A)  1. CV-Hypertensive. I increased his Hydralazine to 100mg  BID and he is on his home dose of Norvasc and Coreg. He was never well controlled at home per the family. I am holding off on titrating the Norvasc since it has made him feel nauseous and sick in the past. May need another agent like a clonidine patch. Working on pain control as well since that could be a contributing factor.  2. Pulm-tolerating room air with good oxygen saturation. CXR stable from yesterday.  3. Renal-creatinine 2.17, CRI 4. Endo-blood glucose with moderate control. Will titrate as needed 5. H and H 8.7/26.9  Plan: Continue to titrate medications as needed. Continue to work on pain control. Some drainage from the lower portion of him sternum. Will monitor.     LOS: 3 days    Elgie Collard 03/28/2019  Agree with above Creat slightly up.  Increase BP control Holding lasix  Ellinor Test O Kenslie Abbruzzese

## 2019-03-28 NOTE — Progress Notes (Signed)
CARDIAC REHAB PHASE I   PRE:  Rate/Rhythm: 91 SR    BP: sitting 139/83    SaO2: 89 RA  MODE:  Ambulation: 470 ft   POST:  Rate/Rhythm: 104 ST    BP: sitting 126/75     SaO2: 89-90 RA, 97 RA after IS   Pt feeling better. Pain and BP better. Able to stand fairly independently and walk with EVA (allowed this once). Steady, no major c/o. SaO2 borderline low; had not used IS today. Able to perform about 2000 mL after walk in recliner and then SaO2 much higher. Encouraged x1-2 more walks today and more IS and recliner.  [place], ACSM 03/28/2019 2:12 PM

## 2019-03-28 NOTE — Progress Notes (Signed)
Patient up to side of bed, diaphoretic, c/o nausea, BP is 190/105.  Zofran and metoprolol administered, blood sugar checked 114.  Refusing po tylenol at this time and refusing to ambulate, will reassess later this am and notify MD.

## 2019-03-29 LAB — BASIC METABOLIC PANEL
Anion gap: 9 (ref 5–15)
BUN: 35 mg/dL — ABNORMAL HIGH (ref 8–23)
CO2: 22 mmol/L (ref 22–32)
Calcium: 8.1 mg/dL — ABNORMAL LOW (ref 8.9–10.3)
Chloride: 105 mmol/L (ref 98–111)
Creatinine, Ser: 2.05 mg/dL — ABNORMAL HIGH (ref 0.61–1.24)
GFR calc Af Amer: 39 mL/min — ABNORMAL LOW (ref >60)
GFR calc non Af Amer: 34 mL/min — ABNORMAL LOW (ref >60)
Glucose, Bld: 227 mg/dL — ABNORMAL HIGH (ref 70–99)
Potassium: 4.2 mmol/L (ref 3.5–5.1)
Sodium: 136 mmol/L (ref 135–145)

## 2019-03-29 LAB — GLUCOSE, CAPILLARY
Glucose-Capillary: 145 mg/dL — ABNORMAL HIGH (ref 70–99)
Glucose-Capillary: 164 mg/dL — ABNORMAL HIGH (ref 70–99)
Glucose-Capillary: 176 mg/dL — ABNORMAL HIGH (ref 70–99)
Glucose-Capillary: 178 mg/dL — ABNORMAL HIGH (ref 70–99)
Glucose-Capillary: 184 mg/dL — ABNORMAL HIGH (ref 70–99)
Glucose-Capillary: 220 mg/dL — ABNORMAL HIGH (ref 70–99)
Glucose-Capillary: 255 mg/dL — ABNORMAL HIGH (ref 70–99)

## 2019-03-29 MED ORDER — AMLODIPINE BESYLATE 5 MG PO TABS: 5.0000 mg | ORAL_TABLET | Freq: Every day | ORAL | 1 refills | Status: DC

## 2019-03-29 MED ORDER — ASPIRIN 325 MG PO TBEC: 325.0000 mg | DELAYED_RELEASE_TABLET | Freq: Every day | ORAL | 0 refills | Status: DC

## 2019-03-29 MED ORDER — CARVEDILOL 25 MG PO TABS: 25.0000 mg | ORAL_TABLET | Freq: Two times a day (BID) | ORAL | 1 refills | Status: DC

## 2019-03-29 MED ORDER — INSULIN ASPART 100 UNIT/ML ~~LOC~~ SOLN
0.0000 [IU] | Freq: Three times a day (TID) | SUBCUTANEOUS | Status: DC
  Administered 2019-03-29: 2 [IU] via SUBCUTANEOUS
  Administered 2019-03-30: 3 [IU] via SUBCUTANEOUS
  Administered 2019-03-30: 2 [IU] via SUBCUTANEOUS

## 2019-03-29 MED ORDER — HYDRALAZINE HCL 100 MG PO TABS: 100.0000 mg | ORAL_TABLET | Freq: Two times a day (BID) | ORAL | 1 refills | Status: DC

## 2019-03-29 MED ORDER — INSULIN DETEMIR 100 UNIT/ML ~~LOC~~ SOLN
10.0000 [IU] | Freq: Every day | SUBCUTANEOUS | Status: DC
  Administered 2019-03-29: 10 [IU] via SUBCUTANEOUS
  Filled 2019-03-29 (×2): qty 0.1

## 2019-03-29 MED ORDER — OXYCODONE HCL 5 MG PO TABS: 5.0000 mg | ORAL_TABLET | Freq: Four times a day (QID) | ORAL | 0 refills | Status: DC | PRN

## 2019-03-29 NOTE — Progress Notes (Signed)
CARDIAC REHAB PHASE I   PRE:  Rate/Rhythm: 81 SR  BP:  Sitting: 124/74      SaO2: 93 RA  MODE:  Ambulation: 450 ft   POST:  Rate/Rhythm: 96 SR  BP:  Sitting: 141/77    SaO2: 94 RA  Pt ambulated 439ft in hallway standby assist with front wheel walker. Pt took two very short standing rest breaks c/o R leg tightness. Pt needing frequent reminders of sternal precautions. Pt and SO educated on importance of showers and monitoring incisions daily. Encouraged continued IS use, walks, and sternal precautions. Pt given in-the-tube sheet along with heart healthy and diabetic diets. Reviewed restrictions and exercise guidelines. Will refer to CRP II [place].   [place], RN BSN 03/29/2019 2:18 PM

## 2019-03-29 NOTE — Discharge Summary (Signed)
[address]       [place] [address]             [phone]      Physician Discharge Summary  Patient ID: [name] MRN: [id number] DOB/AGE: 1956/01/06 63 y.o.  Admit date: 03/25/2019 Discharge date: 03/30/2019  Admission Diagnoses:  Patient Active Problem List   Diagnosis Date Noted  . 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 Special screening for malignant neoplasms, colon 02/04/2019  . Family hx of colon cancer 02/04/2019  . Cardiomyopathy ([place]) 06/08/2018  . CRI (chronic renal insufficiency), stage 3 (moderate) 06/08/2018  . Acute combined systolic (congestive) and diastolic (congestive) heart failure ([place]) 04/26/2018  . HTN (hypertension) 04/26/2018  . 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 Hypertensive urgency 04/26/2018  . Hyperlipidemia 04/26/2018  . Elevated troponin 04/26/2018    Discharge Diagnoses:  Active Problems:   S/P CABG (coronary artery bypass graft)   Discharged Condition: good  HPI:   63 yo male with 3V CAD, and EF 45%. No evidence of valvular disease. Risks and benefits of coronary artery bypass grafting have been discussed, and he is agreeable to proceed with surgery. He has a good OM2, and PDA target. The LAD target is somewhat small, but he has a descent D1 target that covers a large distribution as well. Both will be bypassed. He is tentatively scheduled for 10/12, CABGX 4.  Hospital Course:   On 03/25/2019 [name] underwent a coronary bypass grafting x4 with Dr. Kipp Brood.  He tolerated the procedure well and was transferred to the surgical ICU.  He was extubated in a timely manner.  Postop day 1 his pain was well controlled.  We removed his arterial line and Swan-Ganz catheter.  He remained n.p.o. due to nausea and we added Phenergan.  He remains in the ICU.  Postop day 2 he was tolerating  a normal diet.  We began to titrate his blood pressure medications.  He was stable to transfer to the stepdown unit for continued care.  We continued to increase his home blood pressure medications.  We increase his hydralazine to 100 mg twice daily and his Coreg to 25 mg twice daily.  We increased his Norvasc to 5 mg daily.  Since his creatinine remained elevated and he has a history of chronic renal insufficiency we avoided an ACE inhibitor.  His hemoglobin and hematocrit remained stable.   Today, he is ambulating with limited assistance, he is pulling 2000 on his incentive spirometer, his oxygenation level is 98% on room air, his incisions are healing well, and he is ready for discharge home.  Discharge instructions were reviewed with the patient.   Addendum:  Patient complained of right leg pain around ankle and calf, worse with ambulation.   On exam leg is edematous and tight.  Venous duplex obtained and ruled out DVT.  Consults: None  Significant Diagnostic Studies:  CLINICAL DATA:  Pleural effusion, recent CABG  EXAM: PORTABLE CHEST 1 VIEW  COMPARISON:  Radiograph 03/26/2019  FINDINGS: Right IJ catheter sheath terminates in the upper SVC. Interval removal of the Swan-Ganz catheter. Postsurgical changes related to prior CABG including intact and aligned sternotomy wires and multiple surgical clips projecting over the mediastinum. Mediastinal and left pleural drain remain in place.  Lung volumes remain low with streaky areas of atelectasis. No pneumothorax or large effusion. Mediastinal contours are stable. No acute  osseous or soft tissue abnormality.  IMPRESSION: Satisfactory postoperative appearance of the chest. No large effusion.  Removal of the Swan-Ganz catheter. Right IJ sheath remains in satisfactory position.  Mediastinal and left pleural drain remain in place.   Electronically Signed   By: Lovena Le M.D.   On: 03/27/2019 06:42   Treatments:   03/25/2019 Patient:  [name] Pre-Op Dx: 3V CAD   Post-op Dx:  same Procedure: CABG X 4.  LIMA LAD, RSVG PLV, OM2, D1   Endoscopic greater saphenous vein harvest on the right Intra-operative Transesophageal Echocardiogram  Surgeon and Role:      * Lightfoot, Lucile Crater, MD - Primary    * Dr. Roxan Hockey, MD - assisting   Assistant: T. Harriet Pho, PA-C  Anesthesia  general EBL:  511ml Blood Administration: 2 units of blood Xclamp Time:  65 min Pump Time:  192min  Drains: 19 F blake drain:  R, L, mediastinal  Wires: none Counts: correct   Indications: 63 yo male with 3V CAD, and EF 45%. No evidence of valvular disease. Risks and benefits of coronary artery bypass grafting have been discussed, and he is agreeable to proceed with surgery  Findings: Good conduit, good targets.  Good flows.    Discharge Exam: Blood pressure (!) 183/97, pulse 87, temperature 98.4 F (36.9 C), temperature source Oral, resp. rate 16, height 5\' 10"  (1.778 m), weight 101 kg, SpO2 96 %.    General appearance: alert, cooperative and no distress Heart: regular rate and rhythm, S1, S2 normal, no murmur, click, rub or gallop Lungs: clear to auscultation bilaterally Abdomen: soft, non-tender; bowel sounds normal; no masses,  no organomegaly Extremities: extremities normal, atraumatic, no cyanosis or edema and some right sided lower leg edema (vein harvest leg) Wound: clean. One area of blood tinged drainage.   Disposition: Discharge disposition: 01-Home or Self Care       Discharge Instructions    Amb Referral to Cardiac Rehabilitation   Complete by: As directed    Diagnosis: CABG   CABG X ___: 4   After initial evaluation and assessments completed: Virtual Based Care may be provided alone or in conjunction with Phase 2 Cardiac Rehab based on patient barriers.: Yes     Allergies as of 03/30/2019      Reactions   Bee Venom Anaphylaxis      Medication List    STOP taking  these medications   CoQ-10 400 MG Caps   furosemide 40 MG tablet Commonly known as: LASIX   nitroGLYCERIN 0.4 MG SL tablet Commonly known as: NITROSTAT   OVER THE COUNTER MEDICATION     TAKE these medications   acetaminophen 500 MG tablet Commonly known as: TYLENOL Take 1,000 mg by mouth every 6 (six) hours as needed for moderate pain or headache.   amLODipine 5 MG tablet Commonly known as: NORVASC Take 1 tablet (5 mg total) by mouth daily. What changed:   medication strength  how much to take   amLODipine 10 MG tablet Commonly known as: NORVASC Take 1 tablet (10 mg total) by mouth daily. What changed: You were already taking a medication with the same name, and this prescription was added. Make sure you understand how and when to take each.   aspirin 325 MG EC tablet Take 1 tablet (325 mg total) by mouth daily. What changed:   medication strength  how much to take  Another medication with the same name was removed. Continue taking this medication, and follow the  directions you see here.   B-12 2500 MCG Tabs Take 2,500 mcg by mouth daily.   carvedilol 25 MG tablet Commonly known as: COREG Take 1 tablet (25 mg total) by mouth 2 (two) times daily with a meal. What changed:   medication strength  how much to take  how to take this  when to take this  additional instructions   diphenhydramine-acetaminophen 25-500 MG Tabs tablet Commonly known as: TYLENOL PM Take 1 tablet by mouth at bedtime as needed (sleep).   glipiZIDE 10 MG tablet Commonly known as: GLUCOTROL Take 1 tablet (10 mg total) by mouth 2 (two) times daily.   hydrALAZINE 100 MG tablet Commonly known as: APRESOLINE Take 1 tablet (100 mg total) by mouth 2 (two) times daily. What changed:   medication strength  how much to take   loratadine 10 MG tablet Commonly known as: CLARITIN Take 10 mg by mouth daily as needed for allergies.   multivitamin with minerals Tabs tablet Take 1  tablet by mouth daily.   OSTEO BI-FLEX TRIPLE STRENGTH PO Take 1 tablet by mouth daily.   oxyCODONE 5 MG immediate release tablet Commonly known as: Oxy IR/ROXICODONE Take 1 tablet (5 mg total) by mouth every 6 (six) hours as needed for severe pain.   rosuvastatin 10 MG tablet Commonly known as: CRESTOR Take 1 tablet (10 mg total) by mouth daily.   trolamine salicylate 10 % cream Commonly known as: ASPERCREME Apply 1 application topically as needed for muscle pain.   Vitamin D3 50 MCG (2000 UT) capsule Take 2,000 Units by mouth daily.      Follow-up Information    Lightfoot, Lucile Crater, MD Follow up.   Specialty: Thoracic Surgery Why: Your follow-up appointment is on 04/05/2019 at 2:00pm. Please bring your hospital paperwork. Contact information: [address] [place] [address] [phone]        Herminio Commons, MD Follow up.   Specialty: Cardiology Why: [name] 10/23 @10 :40 am [facility])  Contact information: [place] [address] [phone]        [name], MD. Call in 1 day(s).   Specialty: Family Medicine Contact information: [place] [address] [phone]           Signed:  Original Note By: Nicholes Rough PA-C  Addendum by:  Ellwood Handler, PA-C 03/30/2019, 9:37 AM

## 2019-03-29 NOTE — Progress Notes (Signed)
CARDIAC REHAB PHASE I    POST:  Rate/Rhythm: 98/77  Pt helped to BR. While in BR pt c/o dizziness. Pt then helped to recliner. BP taken as shown above. RN made aware. Will follow-up as time allows to try and walk with pt later today.  [phone] Rufina Falco, RN BSN 03/29/2019 10:06 AM

## 2019-03-29 NOTE — Discharge Instructions (Signed)
Discharge Instructions:  1. You may shower, please wash incisions daily with soap and water and keep dry.  If you wish to cover wounds with dressing you may do so but please keep clean and change daily.  No tub baths or swimming until incisions have completely healed.  If your incisions become red or develop any drainage please call our office at [phone]  2. No Driving until cleared by our office and you are no longer using narcotic pain medications  3. Monitor your weight daily.. Please use the same scale and weigh at same time... If you gain 3-5 lbs in 48 hours with associated lower extremity swelling, please contact our office at [phone]  4. Fever of 101.5 for at least 24 hours, please contact our office at [phone]  5. Activity- up as tolerated, please walk at least 3 times per day.  Avoid strenuous activity, no lifting, pushing, or pulling with your arms over 8-10 lbs for a minimum of 6 weeks  6. If any questions or concerns arise, please do not hesitate to contact our office at [phone]. If drainage increases or become yellow/purulent please contact the office.

## 2019-03-29 NOTE — Progress Notes (Addendum)
      [address]       [place] [address]             [phone]      4 Days Post-Op Procedure(s) (LRB): CORONARY ARTERY BYPASS GRAFTING (CABG) X  4 USING LEFT INTERNAL MAMMARY ARTERY AND RIGHT SAPHENOUS VEIN GRAFTS (N/A) TRANSESOPHAGEAL ECHOCARDIOGRAM (TEE) (N/A) Subjective: No issues this morning. Feeling good.   Objective: Vital signs in last 24 hours: Temp:  [98 F (36.7 C)-98.5 F (36.9 C)] 98.5 F (36.9 C) (10/16 0420) Pulse Rate:  [80-115] 81 (10/16 0420) Cardiac Rhythm: Normal sinus rhythm (10/15 1950) Resp:  [15-24] 19 (10/16 0420) BP: (107-197)/(66-109) 167/92 (10/16 0420) SpO2:  [91 %-98 %] 98 % (10/16 0420) Weight:  [102.7 kg] 102.7 kg (10/16 0420)     Intake/Output from previous day: 10/15 0701 - 10/16 0700 In: 240 [P.O.:240] Out: 300 [Urine:300] Intake/Output this shift: No intake/output data recorded.  General appearance: alert, cooperative and no distress Heart: regular rate and rhythm, S1, S2 normal, no murmur, click, rub or gallop Lungs: clear to auscultation bilaterally Abdomen: soft, non-tender; bowel sounds normal; no masses,  no organomegaly Extremities: extremities normal, atraumatic, no cyanosis or edema and some right sided lower leg edema (vein harvest leg) Wound: clean. One area of blood tinged drainage.   Lab Results: Recent Labs    03/27/19 0259 03/28/19 0316  WBC 12.0* 11.0*  HGB 8.7* 8.5*  HCT 26.9* 26.5*  PLT 177 179   BMET:  Recent Labs    03/27/19 0259 03/28/19 0316  NA 141 139  K 4.3 4.5  CL 111 109  CO2 22 23  GLUCOSE 140* 111*  BUN 28* 34*  CREATININE 2.08* 2.17*  CALCIUM 8.5* 8.4*    PT/INR: No results for input(s): LABPROT, INR in the last 72 hours. ABG    Component Value Date/Time   PHART 7.407 03/25/2019 2110   HCO3 20.8 03/25/2019 2110   TCO2 22 03/25/2019 2110   ACIDBASEDEF 4.0 (H) 03/25/2019 2110   O2SAT 90.0 03/25/2019 2110   CBG (last 3)  Recent Labs    03/28/19 2034  03/29/19 0020 03/29/19 0425  GLUCAP 171* 176* 164*    Assessment/Plan: S/P Procedure(s) (LRB): CORONARY ARTERY BYPASS GRAFTING (CABG) X  4 USING LEFT INTERNAL MAMMARY ARTERY AND RIGHT SAPHENOUS VEIN GRAFTS (N/A) TRANSESOPHAGEAL ECHOCARDIOGRAM (TEE) (N/A)  1. CV-Hypertension-On hydralazine 100mg  BID, Norvasc 5mg  daily, and coreg 25mg  BID. Better control-SBP in the 140-150s.  2. Pulm-tolerating room air with excellent oxygen saturation 3. Renal-creatinine 2.17, avoiding ACEI 4. Endo-CBGs 171, 176, 164-could be better controlled.  5. H and H 8.5/26.5, expected acute blood loss anemia.  Plan: Discharge today if okay with Dr. Kipp Brood. He will see back on 10/23. Asked patient to dress area of drainage with clean gauze and tape after showers.    LOS: 4 days    Elgie Collard 03/29/2019  Agree with above. Awaiting repeat creat Feels better, but looks a bit weak this am Will d/c tomorrow.

## 2019-03-30 ENCOUNTER — Inpatient Hospital Stay (HOSPITAL_COMMUNITY): Payer: Medicaid Other

## 2019-03-30 DIAGNOSIS — R609 Edema, unspecified: Secondary | ICD-10-CM

## 2019-03-30 LAB — GLUCOSE, CAPILLARY
Glucose-Capillary: 171 mg/dL — ABNORMAL HIGH (ref 70–99)
Glucose-Capillary: 228 mg/dL — ABNORMAL HIGH (ref 70–99)

## 2019-03-30 MED ORDER — AMLODIPINE BESYLATE 10 MG PO TABS
10.0000 mg | ORAL_TABLET | Freq: Every day | ORAL | Status: DC
  Administered 2019-03-30: 10 mg via ORAL
  Filled 2019-03-30: qty 1

## 2019-03-30 MED ORDER — AMLODIPINE BESYLATE 10 MG PO TABS: 10.0000 mg | ORAL_TABLET | Freq: Every day | ORAL | 3 refills | Status: DC

## 2019-03-30 MED ORDER — ALUM & MAG HYDROXIDE-SIMETH 200-200-20 MG/5ML PO SUSP
15.0000 mL | ORAL | Status: DC | PRN
  Administered 2019-03-30: 15 mL via ORAL
  Filled 2019-03-30: qty 30

## 2019-03-30 NOTE — Progress Notes (Signed)
Applied knee high TED hose bilaterally. Educated patient on putting on and removing. Patient states wife wore before and was told to wear 24/7. I educated patient. Pt resting with call bell within reach.  Will continue to monitor. Payton Emerald, RN

## 2019-03-30 NOTE — Progress Notes (Signed)
CARDIAC REHAB PHASE I   PRE:  Rate/Rhythm: 87 NSR  BP:  Supine:   Sitting: 151/84  Standing:    SaO2: 97 RA  MODE:  Ambulation: 470 ft   POST:  Rate/Rhythem: 94 NSR  BP:  Supine:   Sitting: 183/97  Standing:    SaO2: 98 RA  [id number] Patient ambulated in hallway independently with RW. Steady gait noted. Standing restbreaks x 2 for increased right leg "tightness". Per patient and RN, lower ext dopplers to be completed today. Post ambulation patient back to sitting at bedside. No cardiac complaints noted. Call bell and phone in reach. Patient's post ambulation BP elevated. Primary RN notified. Discharge education previously completed if patient D/Cs over weekend.  Willadean Carol, RN, BSN

## 2019-03-30 NOTE — Progress Notes (Signed)
VASCULAR LAB PRELIMINARY  PRELIMINARY  PRELIMINARY  PRELIMINARY  Right lower extremity venous duplex completed.    Preliminary report:  See CV proc for preliminary results.   Chrisanne Loose, RVT 03/30/2019, 9:21 AM

## 2019-03-30 NOTE — Progress Notes (Addendum)
      [address]       [place] [address]             [phone]      5 Days Post-Op Procedure(s) (LRB): CORONARY ARTERY BYPASS GRAFTING (CABG) X  4 USING LEFT INTERNAL MAMMARY ARTERY AND RIGHT SAPHENOUS VEIN GRAFTS (N/A) TRANSESOPHAGEAL ECHOCARDIOGRAM (TEE) (N/A)   Subjective:  Patient complains of pain and tightness of his right leg.  Worse in back of calf and ankle, makes its hard to walk sometimes.  Objective: Vital signs in last 24 hours: Temp:  [97.7 F (36.5 C)-98.3 F (36.8 C)] 97.9 F (36.6 C) (10/17 0602) Pulse Rate:  [73-84] 79 (10/17 0602) Cardiac Rhythm: Normal sinus rhythm (10/17 0704) Resp:  [14-20] 20 (10/17 0602) BP: (118-177)/(60-90) 177/90 (10/17 0602) SpO2:  [95 %-100 %] 100 % (10/17 0602) Weight:  [101 kg] 101 kg (10/17 0602)  Intake/Output from previous day: 10/16 0701 - 10/17 0700 In: 960 [P.O.:960] Out: -   General appearance: alert, cooperative and no distress Heart: regular rate and rhythm Lungs: clear to auscultation bilaterally Abdomen: soft, non-tender; bowel sounds normal; no masses,  no organomegaly Extremities: edema Right leg is very tight, edematous, painful around ankle, foot looks slightly mottled, LLE is normal Wound: clean and dry  Lab Results: Recent Labs    03/28/19 0316  WBC 11.0*  HGB 8.5*  HCT 26.5*  PLT 179   BMET:  Recent Labs    03/28/19 0316 03/29/19 1102  NA 139 136  K 4.5 4.2  CL 109 105  CO2 23 22  GLUCOSE 111* 227*  BUN 34* 35*  CREATININE 2.17* 2.05*  CALCIUM 8.4* 8.1*    PT/INR: No results for input(s): LABPROT, INR in the last 72 hours. ABG    Component Value Date/Time   PHART 7.407 03/25/2019 2110   HCO3 20.8 03/25/2019 2110   TCO2 22 03/25/2019 2110   ACIDBASEDEF 4.0 (H) 03/25/2019 2110   O2SAT 90.0 03/25/2019 2110   CBG (last 3)  Recent Labs    03/29/19 1702 03/29/19 2113 03/30/19 0635  GLUCAP 178* 255* 171*    Assessment/Plan: S/P Procedure(s) (LRB):  CORONARY ARTERY BYPASS GRAFTING (CABG) X  4 USING LEFT INTERNAL MAMMARY ARTERY AND RIGHT SAPHENOUS VEIN GRAFTS (N/A) TRANSESOPHAGEAL ECHOCARDIOGRAM (TEE) (N/A)  1. CV- NSR, HTN - increase Norvasc to 10 mg daily, continue Coreg, Hydralazine 2. Pulm- no acute issues, continue IS 3. Renal- creatinine at 2.07, baseline appears to be about 2 not currently on Lasix 4. RLE edema/pain, tightness present-- foot appears a little mottled will get venous duplex to r/o DVT 5. DM-sugars controlled 6. Dispo- patient stable, will get duplex to r/o DVT, increase Norvasc for better BP control, creatinine is stable, possibly d/c home later today if duplex is okay   Addendum:  Preliminary Duplex is negative for DVT, will apply TED hose, patient has follow up in 1 week.  Will d/c home today    LOS: 5 days    Ellwood Handler 03/30/2019  Sinus rhythm, surgical incisions clean and dry Discharge instructions reviewed with patient Agree with above assessment and plan Dahlia Byes MD

## 2019-04-01 ENCOUNTER — Other Ambulatory Visit: Payer: Self-pay | Admitting: Thoracic Surgery (Cardiothoracic Vascular Surgery)

## 2019-04-01 ENCOUNTER — Telehealth: Payer: Self-pay

## 2019-04-01 MED ORDER — PROMETHAZINE HCL 12.5 MG PO TABS: 12.5000 mg | ORAL_TABLET | Freq: Four times a day (QID) | ORAL | 0 refills | Status: DC | PRN

## 2019-04-01 NOTE — Telephone Encounter (Signed)
Pt's wife called office this morning, stating pt is nauseated and dizzy after eating breakfast. Pt has been having bowel movements and is afebrile. Wife reports pt had some nausea while in the hospital and states pt is requesting Rx of the medication which alleviated it there. Dr. Kipp Brood made aware and is going to call in phenergan for pt. Wife notified. To f/u w/ Dr. Kipp Brood in office on 04/05/19.

## 2019-04-03 ENCOUNTER — Telehealth: Payer: Self-pay

## 2019-04-03 NOTE — Telephone Encounter (Signed)
Pt's wife called office this morning stating that pt is having difficulty coughing up phlegm. Also stated that he is having episodes of shortness of breath. Informed that it is expected for him to have some shortness of breath during recovery, especially w/ exertion; however, if pt becomes extremely short of breath or has sudden onset of labored breathing, this could be an emergent situation. She reports that pt's respirations do not appear labored at this time. Recommended taking OTC medication for congestion, such as Mucinex, to help pt clear airway. Also advised to make sure he stays hydrated. She verbalizes understanding. Instructed to call back w/ further concerns or worsening of symptoms.

## 2019-04-05 ENCOUNTER — Other Ambulatory Visit: Payer: Self-pay

## 2019-04-05 ENCOUNTER — Ambulatory Visit (INDEPENDENT_AMBULATORY_CARE_PROVIDER_SITE_OTHER): Payer: Self-pay | Admitting: Thoracic Surgery (Cardiothoracic Vascular Surgery)

## 2019-04-05 ENCOUNTER — Encounter: Payer: Self-pay | Admitting: Thoracic Surgery (Cardiothoracic Vascular Surgery)

## 2019-04-05 ENCOUNTER — Ambulatory Visit (INDEPENDENT_AMBULATORY_CARE_PROVIDER_SITE_OTHER): Payer: Medicaid Other | Admitting: Cardiovascular Disease

## 2019-04-05 ENCOUNTER — Encounter: Payer: Self-pay | Admitting: Cardiovascular Disease

## 2019-04-05 ENCOUNTER — Telehealth: Payer: Self-pay | Admitting: Family Medicine

## 2019-04-05 VITALS — BP 138/78 | HR 72 | Ht 70.0 in | Wt 224.0 lb

## 2019-04-05 VITALS — BP 136/69 | HR 80 | Temp 97.9°F | Resp 20 | Ht 70.0 in | Wt 220.0 lb

## 2019-04-05 DIAGNOSIS — N183 Chronic kidney disease, stage 3 unspecified: Secondary | ICD-10-CM | POA: Diagnosis not present

## 2019-04-05 DIAGNOSIS — I25708 Atherosclerosis of coronary artery bypass graft(s), unspecified, with other forms of angina pectoris: Secondary | ICD-10-CM | POA: Diagnosis not present

## 2019-04-05 DIAGNOSIS — Z951 Presence of aortocoronary bypass graft: Secondary | ICD-10-CM

## 2019-04-05 DIAGNOSIS — I10 Essential (primary) hypertension: Secondary | ICD-10-CM

## 2019-04-05 DIAGNOSIS — E119 Type 2 diabetes mellitus without complications: Secondary | ICD-10-CM

## 2019-04-05 DIAGNOSIS — E785 Hyperlipidemia, unspecified: Secondary | ICD-10-CM | POA: Diagnosis not present

## 2019-04-05 DIAGNOSIS — I2510 Atherosclerotic heart disease of native coronary artery without angina pectoris: Secondary | ICD-10-CM

## 2019-04-05 NOTE — Addendum Note (Signed)
Addended by: Carmelina Noun on: 04/05/2019 11:11 AM   Modules accepted: Orders

## 2019-04-05 NOTE — Telephone Encounter (Signed)
Referral to endocrinology [facility]

## 2019-04-05 NOTE — Telephone Encounter (Signed)
Referral put in.

## 2019-04-05 NOTE — Patient Instructions (Addendum)
Medication Instructions:  Continue all current medications.  Labwork: none  Testing/Procedures: none  Follow-Up: 3 months   Any Other Special Instructions Will Be Listed Below (If Applicable). You have been referred to:  Cardiac rehab at [facility].  If you need a refill on your cardiac medications before your next appointment, please call your pharmacy.

## 2019-04-05 NOTE — Progress Notes (Signed)
SUBJECTIVE: The patient presents for posthospitalization follow-up.  He underwent 5 vessel CABG on 03/25/2019 with a LIMA to the LAD, and SVG to the PLV, OM 2, and D1.  Intraoperative TEE on 03/25/2019 demonstrated normal LV systolic function, EF 50 to 55%, mild LVH, and mild mitral and tricuspid regurgitation.  He wants to get his diabetes under better control and would like to see an endocrinologist in [place].  He has chest wall soreness.  His breathing is gradually improving.  He has some mild right leg swelling where the vein was harvested.   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 He is married. He is originally from [place] but moved here at the age of 53. His father was a Garment/textile technologist.  Review of Systems: As per "subjective", otherwise negative.  Allergies  Allergen Reactions  . Bee Venom Anaphylaxis    Current Outpatient Medications  Medication Sig Dispense Refill  . acetaminophen (TYLENOL) 500 MG tablet Take 1,000 mg by mouth every 6 (six) hours as needed for moderate pain or headache.    Marland Kitchen amLODipine (NORVASC) 10 MG tablet Take 1 tablet (10 mg total) by mouth daily. 30 tablet 3  . amLODipine (NORVASC) 5 MG tablet Take 1 tablet (5 mg total) by mouth daily. 30 tablet 1  . aspirin EC 325 MG EC tablet Take 1 tablet (325 mg total) by mouth daily. 30 tablet 0  . carvedilol (COREG) 25 MG tablet Take 1 tablet (25 mg total) by mouth 2 (two) times daily with a meal. 60 tablet 1  . Cholecalciferol (VITAMIN D3) 50 MCG (2000 UT) capsule Take 2,000 Units by mouth daily.    . Cyanocobalamin (B-12) 2500 MCG TABS Take 2,500 mcg by mouth daily.    . diphenhydramine-acetaminophen (TYLENOL PM) 25-500 MG TABS tablet Take 1 tablet by mouth at bedtime as needed (sleep).    Marland Kitchen glipiZIDE (GLUCOTROL) 10 MG tablet Take 1 tablet (10 mg total) by mouth 2 (two) times  daily. 180 tablet 1  . hydrALAZINE (APRESOLINE) 100 MG tablet Take 1 tablet (100 mg total) by mouth 2 (two) times daily. 60 tablet 1  . loratadine (CLARITIN) 10 MG tablet Take 10 mg by mouth daily as needed for allergies.    . Misc Natural Products (OSTEO BI-FLEX TRIPLE STRENGTH PO) Take 1 tablet by mouth daily.    . Multiple Vitamin (MULTIVITAMIN WITH MINERALS) TABS tablet Take 1 tablet by mouth daily.    Marland Kitchen oxyCODONE (OXY IR/ROXICODONE) 5 MG immediate release tablet Take 1 tablet (5 mg total) by mouth every 6 (six) hours as needed for severe pain. 30 tablet 0  . promethazine (PHENERGAN) 12.5 MG tablet Take 1 tablet (12.5 mg total) by mouth every 6 (six) hours as needed for nausea or vomiting. 30 tablet 0  . rosuvastatin (CRESTOR) 10 MG tablet Take 1 tablet (10 mg total) by mouth daily. 90 tablet 1  . trolamine salicylate (ASPERCREME) 10 % cream Apply 1 application topically as needed for muscle pain.     No current facility-administered medications for this visit.     Past Medical History:  Diagnosis Date  . Diabetes mellitus without complication ([place])   . Hypertension     Past Surgical History:  Procedure Laterality Date  . CORONARY ARTERY BYPASS GRAFT N/A 03/25/2019   Procedure: CORONARY ARTERY BYPASS GRAFTING (CABG) X  4 USING LEFT  INTERNAL MAMMARY ARTERY AND RIGHT SAPHENOUS VEIN GRAFTS;  Surgeon: [name], MD;  Location: [facility];  Service: Open Heart Surgery;  Laterality: N/A;  . EXCISION MORTON'S NEUROMA    . RIGHT/LEFT HEART CATH AND CORONARY ANGIOGRAPHY N/A 03/12/2019   Procedure: RIGHT/LEFT HEART CATH AND CORONARY ANGIOGRAPHY;  Surgeon: [name], MD;  Location: [facility];  Service: Cardiovascular;  Laterality: N/A;  . TEE WITHOUT CARDIOVERSION N/A 03/25/2019   Procedure: TRANSESOPHAGEAL ECHOCARDIOGRAM (TEE);  Surgeon: [name], MD;  Location: [facility];  Service: Open Heart Surgery;  Laterality: N/A;    Social History   Socioeconomic  History  . Marital status: Single    Spouse name: Not on file  . Number of children: Not on file  . Years of education: Not on file  . Highest education level: Not on file  Occupational History  . Not on file  Social Needs  . Financial resource strain: Not on file  . Food insecurity    Worry: Not on file    Inability: Not on file  . Transportation needs    Medical: Not on file    Non-medical: Not on file  Tobacco Use  . Smoking status: Former Smoker    Quit date: 07/06/1988    Years since quitting: 30.7  . Smokeless tobacco: Never Used  Substance and Sexual Activity  . Alcohol use: Not Currently    Comment: socially  . Drug use: No  . Sexual activity: Not on file  Lifestyle  . Physical activity    Days per week: Not on file    Minutes per session: Not on file  . Stress: Not on file  Relationships  . Social Herbalist on phone: Not on file    Gets together: Not on file    Attends religious service: Not on file    Active member of club or organization: Not on file    Attends meetings of clubs or organizations: Not on file    Relationship status: Not on file  . Intimate partner violence    Fear of current or ex partner: Not on file    Emotionally abused: Not on file    Physically abused: Not on file    Forced sexual activity: Not on file  Other Topics Concern  . Not on file  Social History Narrative  . Not on file     Vitals:   04/05/19 1018  Weight: 224 lb (101.6 kg)  Height: 5\' 10"  (1.778 m)    Wt Readings from Last 3 Encounters:  04/05/19 224 lb (101.6 kg)  03/30/19 222 lb 11.2 oz (101 kg)  03/21/19 214 lb 12.8 oz (97.4 kg)     PHYSICAL EXAM General: NAD HEENT: Normal. Neck: No JVD, no thyromegaly. Lungs: Clear to auscultation bilaterally with normal respiratory effort. CV: Regular rate and rhythm, normal S1/S2, no S3/S4, no murmur.  Trace right leg edema.  Healed midline sternotomy incision. Abdomen: Soft, nontender, no distention.   Neurologic: Alert and oriented.  Psych: Normal affect. Skin: Normal. Musculoskeletal: No gross deformities.      Labs: Lab Results  Component Value Date/Time   K 4.2 03/29/2019 11:02 AM   BUN 35 (H) 03/29/2019 11:02 AM   BUN 34 (H) 03/01/2019 08:41 AM   CREATININE 2.05 (H) 03/29/2019 11:02 AM   ALT 20 03/21/2019 03:26 PM   TSH 2.110 04/29/2018 09:00 AM   HGB 8.5 (L) 03/28/2019 03:16 AM  Lipids: Lab Results  Component Value Date/Time   LDLCALC 77 12/31/2018 09:58 AM   CHOL 152 12/31/2018 09:58 AM   TRIG 172 (H) 12/31/2018 09:58 AM   HDL 41 12/31/2018 09:58 AM       ASSESSMENT AND PLAN:  1.  Coronary artery disease: He underwent 5 vessel CABG on 03/25/2019 with a LIMA to the LAD, and SVG to the PLV, OM 2, and D1. Intraoperative TEE on 03/25/2019 demonstrated normal LV systolic function, EF 50 to 55%, mild LVH, and mild mitral and tricuspid regurgitation. Continue aspirin, carvedilol and rosuvastatin.  I will make a referral to cardiac rehabilitation at [facility].  2.  Hypertension: Blood pressure is controlled.  No changes to therapy.  3.  Chronic kidney disease stage III: BUN 35, creatinine 2.05 on 03/29/2019.  4.  Hyperlipidemia: Continue rosuvastatin.  LDL 77 on 12/31/2018.  This will need further monitoring.   Disposition: Follow up 3 months   [name], M.D., F.A.C.C.

## 2019-04-05 NOTE — Progress Notes (Signed)
      [name]       [place] [address]             [phone]        Cleve M Hoppes [place] Medical Record #[id number] Date of Birth: [birth year]  Referring: [name], MD Primary Care: [name], MD Primary Cardiologist:Suresh Bronson Ing, MD  Reason for visit:   follow-up  History of Present Illness:     [name] presents for his first follow-up appointment. He complains of some shortness of breath while sleeping.  Otherwise he has done well  Physical Exam: BP 136/69   Pulse 80   Temp 97.9 F (36.6 C) (Skin)   Resp 20   Ht 5\' 10"  (1.778 m)   Wt 220 lb (99.8 kg)   SpO2 90% Comment: RA  BMI 31.57 kg/m   Alert NAD  Incision clean.  Sternum stable Abdomen soft, NT/ND Trace peripheral edema     Assessment / Plan:   63 year old male status post CABG. acute on chronic renal insufficiency, diabetes mellitus. Overall doing well We will return to clinic in 1 month.   [name] 04/05/2019 4:59 PM

## 2019-04-08 ENCOUNTER — Other Ambulatory Visit: Payer: Self-pay | Admitting: *Deleted

## 2019-04-08 DIAGNOSIS — Z951 Presence of aortocoronary bypass graft: Secondary | ICD-10-CM

## 2019-04-08 MED ORDER — TRAMADOL HCL 50 MG PO TABS: 50.0000 mg | ORAL_TABLET | Freq: Four times a day (QID) | ORAL | 0 refills | Status: DC | PRN

## 2019-04-09 ENCOUNTER — Encounter: Payer: Self-pay | Admitting: Family Medicine

## 2019-04-12 ENCOUNTER — Telehealth: Payer: Self-pay | Admitting: Family Medicine

## 2019-04-12 NOTE — Telephone Encounter (Signed)
Patient states that [name] cant see patient till 11/2019 and he needs to see someone sooner due to uncontrolled sugar. Patient states he is having bleeding in eyes from his sugars being so high and would like to see someone who can see him sooner.

## 2019-04-12 NOTE — Telephone Encounter (Signed)
Please let the patient know that we did make this referral back on 10/23 and often takes time  Patient does have options locally Dr. Dorris Fetch In [facility] endocrinology  Talk with patient please assist making sure the referral is going through

## 2019-04-12 NOTE — Telephone Encounter (Signed)
Patient is requesting a referral to a diabetes specialist to help him get it under control. Who do you recommend please.

## 2019-04-12 NOTE — Telephone Encounter (Signed)
Referral was sent to [name] and their office tried to call patient yesterday pm to set up appt . Patient will call their office back to schedule appointment.

## 2019-04-15 ENCOUNTER — Telehealth: Payer: Self-pay | Admitting: Family Medicine

## 2019-04-15 NOTE — Telephone Encounter (Signed)
Left message to return call 

## 2019-04-15 NOTE — Telephone Encounter (Signed)
Nurses Patient recently in the hospital for a bypass surgery His community care plan with his insurance is requesting for him to do a hospital follow-up visit with [place] to review over and verify his medications.  This can be a virtual visit or in person if the patient would rather do so  If the patient needs anything addressed in the meantime please let me know otherwise  Please help set this up

## 2019-04-15 NOTE — Telephone Encounter (Signed)
Pt returned call and verbalized understanding. Pt transferred up front to set up appt.  

## 2019-04-23 ENCOUNTER — Encounter: Payer: Self-pay | Admitting: Family Medicine

## 2019-04-23 ENCOUNTER — Other Ambulatory Visit: Payer: Self-pay

## 2019-04-23 ENCOUNTER — Ambulatory Visit (INDEPENDENT_AMBULATORY_CARE_PROVIDER_SITE_OTHER): Payer: Medicaid Other | Admitting: Family Medicine

## 2019-04-23 VITALS — BP 146/82 | Temp 97.0°F | Wt 216.0 lb

## 2019-04-23 DIAGNOSIS — E1121 Type 2 diabetes mellitus with diabetic nephropathy: Secondary | ICD-10-CM | POA: Diagnosis not present

## 2019-04-23 DIAGNOSIS — E1159 Type 2 diabetes mellitus with other circulatory complications: Secondary | ICD-10-CM | POA: Insufficient documentation

## 2019-04-23 DIAGNOSIS — E113393 Type 2 diabetes mellitus with moderate nonproliferative diabetic retinopathy without macular edema, bilateral: Secondary | ICD-10-CM

## 2019-04-23 DIAGNOSIS — I10 Essential (primary) hypertension: Secondary | ICD-10-CM | POA: Diagnosis not present

## 2019-04-23 DIAGNOSIS — E782 Mixed hyperlipidemia: Secondary | ICD-10-CM | POA: Diagnosis not present

## 2019-04-23 DIAGNOSIS — Z23 Encounter for immunization: Secondary | ICD-10-CM | POA: Diagnosis not present

## 2019-04-23 DIAGNOSIS — D509 Iron deficiency anemia, unspecified: Secondary | ICD-10-CM | POA: Diagnosis not present

## 2019-04-23 DIAGNOSIS — E119 Type 2 diabetes mellitus without complications: Secondary | ICD-10-CM | POA: Insufficient documentation

## 2019-04-23 HISTORY — DX: Type 2 diabetes mellitus with diabetic nephropathy: E11.21

## 2019-04-23 MED ORDER — SHINGRIX 50 MCG/0.5ML IM SUSR: 0.5000 mL | Freq: Once | INTRAMUSCULAR | 1 refills | Status: AC

## 2019-04-23 NOTE — Progress Notes (Signed)
   Subjective:    Patient ID: [name], male    DOB: 1956-05-31, 63 y.o.   MRN: [id number]  HPI  Pt here today for hospital follow up. Pt had bypass surgery March 25 2019. Pt stayed in [facility] until 03/30/2019. Pt states he feeling better each day. Patient denies any type of chest tightness pressure pain or shortness of breath he does have some swelling in the right lower leg but that is where the vein was harvested.  Patient does relate compliance with his medicines.  Tries to be careful with diet States his sugar readings have been doing well Previously patient has had serious significant underlying issues with his diabetes for years now he states he is more motivated He also has significant renal insufficiency associated with the diabetes as well he is staying away from all NSAIDs.  PMH benign, cardiovascular disease, diabetes, hyperlipidemia, hypertension Pt would like referral to diabetic specialist. [facility] is unable to see him until 11/2019 and pt states he needs to see someone sooner.     Review of Systems  Constitutional: Negative for diaphoresis and fatigue.  HENT: Negative for congestion and rhinorrhea.   Respiratory: Negative for cough and shortness of breath.   Cardiovascular: Negative for chest pain and leg swelling.  Gastrointestinal: Negative for abdominal pain and diarrhea.  Skin: Negative for color change and rash.  Neurological: Negative for dizziness and headaches.  Psychiatric/Behavioral: Negative for behavioral problems and confusion.       Objective:   Physical Exam Vitals signs reviewed.  Constitutional:      General: He is not in acute distress. HENT:     Head: Normocephalic and atraumatic.  Eyes:     General:        Right eye: No discharge.        Left eye: No discharge.  Neck:     Trachea: No tracheal deviation.  Cardiovascular:     Rate and Rhythm: Normal rate and regular rhythm.     Heart sounds: Normal heart sounds. No murmur.  Pulmonary:      Effort: Pulmonary effort is normal. No respiratory distress.     Breath sounds: Normal breath sounds.  Lymphadenopathy:     Cervical: No cervical adenopathy.  Skin:    General: Skin is warm and dry.  Neurological:     Mental Status: He is alert.     Coordination: Coordination normal.  Psychiatric:        Behavior: Behavior normal.           Assessment & Plan:  1. Essential hypertension Blood pressure good control continue current measures watch salt in diet stay active - CBC with Differential - Ferritin - Basic Metabolic Panel (BMET)  2. Moderate nonproliferative diabetic retinopathy of both eyes associated with type 2 diabetes mellitus, macular edema presence unspecified (HCC) Repeat metabolic 7.  Stay away from all NSAIDs see eye specialist regular basis - CBC with Differential - Ferritin - Basic Metabolic Panel (BMET) - Ambulatory referral to Endocrinology  3. Mixed hyperlipidemia Continue take statin. - CBC with Differential - Ferritin - Basic Metabolic Panel (BMET)  4. Type 2 diabetes mellitus with diabetic nephropathy, without long-term current use of insulin ([place]) Referral to endocrinology Stay away from all NSAIDs Repeat met 7 Patient defers on flu shot  Significant anemia check CBC ferritin patient had recent surgery

## 2019-04-24 ENCOUNTER — Encounter: Payer: Self-pay | Admitting: Family Medicine

## 2019-04-24 LAB — BASIC METABOLIC PANEL
BUN/Creatinine Ratio: 12 (ref 10–24)
BUN: 23 mg/dL (ref 8–27)
CO2: 22 mmol/L (ref 20–29)
Calcium: 9.9 mg/dL (ref 8.6–10.2)
Chloride: 99 mmol/L (ref 96–106)
Creatinine, Ser: 1.86 mg/dL — ABNORMAL HIGH (ref 0.76–1.27)
GFR calc Af Amer: 44 mL/min/{1.73_m2} — ABNORMAL LOW (ref >59)
GFR calc non Af Amer: 38 mL/min/{1.73_m2} — ABNORMAL LOW (ref >59)
Glucose: 144 mg/dL — ABNORMAL HIGH (ref 65–99)
Potassium: 4.5 mmol/L (ref 3.5–5.2)
Sodium: 139 mmol/L (ref 134–144)

## 2019-04-24 LAB — CBC WITH DIFFERENTIAL/PLATELET
Basophils Absolute: 0.1 10*3/uL (ref 0.0–0.2)
Basos: 2 %
EOS (ABSOLUTE): 0.5 10*3/uL — ABNORMAL HIGH (ref 0.0–0.4)
Eos: 7 %
Hematocrit: 30.8 % — ABNORMAL LOW (ref 37.5–51.0)
Hemoglobin: 10.1 g/dL — ABNORMAL LOW (ref 13.0–17.7)
Immature Grans (Abs): 0 10*3/uL (ref 0.0–0.1)
Immature Granulocytes: 0 %
Lymphocytes Absolute: 1.5 10*3/uL (ref 0.7–3.1)
Lymphs: 20 %
MCH: 26.8 pg (ref 26.6–33.0)
MCHC: 32.8 g/dL (ref 31.5–35.7)
MCV: 82 fL (ref 79–97)
Monocytes Absolute: 0.7 10*3/uL (ref 0.1–0.9)
Monocytes: 9 %
Neutrophils Absolute: 4.7 10*3/uL (ref 1.4–7.0)
Neutrophils: 62 %
Platelets: 360 10*3/uL (ref 150–450)
RBC: 3.77 x10E6/uL — ABNORMAL LOW (ref 4.14–5.80)
RDW: 13.5 % (ref 11.6–15.4)
WBC: 7.4 10*3/uL (ref 3.4–10.8)

## 2019-04-24 LAB — FERRITIN: Ferritin: 272 ng/mL (ref 30–400)

## 2019-04-24 NOTE — Telephone Encounter (Signed)
Dr Dorris Fetch is a very good endocrinologist I would definitely feel good about him being seen by Dr. Dorris Fetch (Patient initially wanted [facility] because all of his heart specialist were in [place])

## 2019-04-24 NOTE — Telephone Encounter (Signed)
[facility] does not accept Medicaid coverage  Please advise - refer to Dr. Dorris Fetch?

## 2019-04-26 ENCOUNTER — Telehealth: Payer: Self-pay

## 2019-04-26 ENCOUNTER — Other Ambulatory Visit (HOSPITAL_COMMUNITY): Payer: Self-pay | Admitting: Physician Assistant

## 2019-04-26 MED ORDER — POTASSIUM CHLORIDE ER 20 MEQ PO TBCR: 20.0000 meq | EXTENDED_RELEASE_TABLET | Freq: Every day | ORAL | 0 refills | Status: DC

## 2019-04-26 MED ORDER — FUROSEMIDE 40 MG PO TABS: 40.0000 mg | ORAL_TABLET | Freq: Every day | ORAL | 0 refills | Status: DC

## 2019-04-26 NOTE — Telephone Encounter (Signed)
Pt called the office this morning, wanting to know if he can soak in a hot tub d/t generalized aches and pains. He had a CABG x 4 on 03/25/19 and says his incisions appear to be healed. He also wants to know if he can have a medication for the pitting edema in his legs. Reports that he has been wearing stockings and keeping legs elevated but still has pitting edema. Contacted T. Harriet Pho, [place] for Dr. Kipp Brood and made aware of situation. She says pt should not have a tub bath until he is evaluated by Dr. Kipp Brood next week. She reports calling in a 3 day dose of Lasix and Potassium to pt's pharmacy. TC to pt to inform him of medications called in and PA's instructions not to take a tub bath. He verbalizes understanding. To f/u w/ Dr. Kipp Brood on 05/03/19.

## 2019-04-26 NOTE — Progress Notes (Signed)
      [address]       [place] [address]             [phone]      [name] is a 62 y.o. male patient s/p CABG on 03/25/2019.  He called the office with complaint of swelling in his lower extremity. He did receive lasix when in the hospital but was not discharged on it. Creatinine has never been normal and was 1.86 on 11/10. It was 2.05 while in the hospital. I prescribed 3 days of lasix 40mg  daily with potassium supplementation. He will need follow-up next week with either a PA or Dr. Kipp Brood. He likely will need long-term monitoring of his renal function and intermittent diuretics. Since he is 4+ weeks from surgery, he will need to follow-up with either Cardiology or his PCP for further issues with lower extremity swelling.   When evaluated next week, we can look at his incision and if well healed we can clear him for submerging in water (taking a bath). I did not feel comfortable giving the okay over the phone on this before seeing his incisions. At 4 weeks out, there can be portions of the incision which are permeable to water and this would present a risk for infection.    New prescriptions:  Lasix 40mg  daily x 3 days Potassium KDur supplements 20MEQ daily x 3 days  Nicholes Rough, PA-C

## 2019-04-27 ENCOUNTER — Other Ambulatory Visit: Payer: Self-pay | Admitting: Family Medicine

## 2019-04-29 ENCOUNTER — Encounter: Payer: Self-pay | Admitting: Family Medicine

## 2019-04-29 ENCOUNTER — Other Ambulatory Visit: Payer: Self-pay

## 2019-04-29 DIAGNOSIS — H4311 Vitreous hemorrhage, right eye: Secondary | ICD-10-CM | POA: Diagnosis not present

## 2019-04-29 DIAGNOSIS — Z01812 Encounter for preprocedural laboratory examination: Secondary | ICD-10-CM | POA: Diagnosis not present

## 2019-04-29 DIAGNOSIS — Z20828 Contact with and (suspected) exposure to other viral communicable diseases: Secondary | ICD-10-CM | POA: Diagnosis not present

## 2019-05-01 ENCOUNTER — Other Ambulatory Visit: Payer: Self-pay | Admitting: Thoracic Surgery (Cardiothoracic Vascular Surgery)

## 2019-05-01 DIAGNOSIS — Z951 Presence of aortocoronary bypass graft: Secondary | ICD-10-CM

## 2019-05-02 ENCOUNTER — Other Ambulatory Visit: Payer: Self-pay | Admitting: Thoracic Surgery (Cardiothoracic Vascular Surgery)

## 2019-05-02 DIAGNOSIS — Z951 Presence of aortocoronary bypass graft: Secondary | ICD-10-CM

## 2019-05-03 ENCOUNTER — Encounter: Payer: Self-pay | Admitting: Thoracic Surgery (Cardiothoracic Vascular Surgery)

## 2019-05-03 ENCOUNTER — Other Ambulatory Visit: Payer: Self-pay

## 2019-05-03 ENCOUNTER — Ambulatory Visit (INDEPENDENT_AMBULATORY_CARE_PROVIDER_SITE_OTHER): Payer: Self-pay | Admitting: Thoracic Surgery (Cardiothoracic Vascular Surgery)

## 2019-05-03 ENCOUNTER — Ambulatory Visit
Admission: RE | Admit: 2019-05-03 | Discharge: 2019-05-03 | Disposition: A | Discharge status: Home / Self Care | Payer: Medicaid Other | Source: Ambulatory Visit | Attending: Thoracic Surgery (Cardiothoracic Vascular Surgery) | Admitting: Thoracic Surgery (Cardiothoracic Vascular Surgery)

## 2019-05-03 ENCOUNTER — Other Ambulatory Visit: Payer: Self-pay | Admitting: *Deleted

## 2019-05-03 VITALS — BP 123/65 | HR 82 | Temp 97.7°F | Resp 20 | Ht 70.0 in | Wt 221.0 lb

## 2019-05-03 DIAGNOSIS — Z951 Presence of aortocoronary bypass graft: Secondary | ICD-10-CM

## 2019-05-03 DIAGNOSIS — J90 Pleural effusion, not elsewhere classified: Secondary | ICD-10-CM | POA: Diagnosis not present

## 2019-05-03 DIAGNOSIS — I509 Heart failure, unspecified: Secondary | ICD-10-CM

## 2019-05-03 MED ORDER — TRAMADOL HCL 50 MG PO TABS: 50.0000 mg | ORAL_TABLET | Freq: Two times a day (BID) | ORAL | 0 refills | Status: AC | PRN

## 2019-05-03 MED ORDER — FUROSEMIDE 40 MG PO TABS: 40.0000 mg | ORAL_TABLET | Freq: Every day | ORAL | 0 refills | Status: DC

## 2019-05-03 MED ORDER — ASPIRIN 325 MG PO TBEC: 81.0000 mg | DELAYED_RELEASE_TABLET | Freq: Every day | ORAL | 0 refills | Status: DC

## 2019-05-03 NOTE — Progress Notes (Signed)
      [address]       ,[place] [address]             [phone]        [name] [facility] Medical Record #[id number] Date of Birth: February 25, 1956  Referring: [name], MD Primary Care: [name], MD Primary Cardiologist:Suresh Bronson Ing, MD  Reason for visit:   follow-up  History of Present Illness:     63 year old male presents for his second follow-up appointment after undergoing coronary artery bypass grafting.  Feels much better today compared to his first visit.  He still has easy fatigability, but wants to be more active.  He has had some swelling in both of his lower extremities.  Physical Exam: BP 123/65   Pulse 82   Temp 97.7 F (36.5 C) (Skin)   Resp 20   Ht 5\' 10"  (1.778 m)   Wt 221 lb (100.2 kg)   SpO2 92% Comment: RA  BMI 31.71 kg/m   Alert NAD Incisions clean.  There is a small pustule in the middle of his incision.  No erythema or drainage. Easy work of breathing Abdomen soft 2+ peripheral edema to bilateral lower extremities   Diagnostic Studies & Laboratory data: CXR: Clear   Assessment / Plan:   63 year old gentleman status post coronary artery bypass grafting.  He still has some peripheral edema.  His most recent creatinine showed a level of 1.8 which is close to his baseline.  I have restarted him on a 14-day course of Lasix, and he will get repeat labs in 1 week to ensure no worsening renal function.  I will follow up on his labs and call him with the results.   [name] 05/03/2019 3:17 PM

## 2019-05-06 DIAGNOSIS — H4311 Vitreous hemorrhage, right eye: Secondary | ICD-10-CM | POA: Diagnosis not present

## 2019-05-06 DIAGNOSIS — E113591 Type 2 diabetes mellitus with proliferative diabetic retinopathy without macular edema, right eye: Secondary | ICD-10-CM | POA: Diagnosis not present

## 2019-05-07 DIAGNOSIS — E113532 Type 2 diabetes mellitus with proliferative diabetic retinopathy with traction retinal detachment not involving the macula, left eye: Secondary | ICD-10-CM | POA: Diagnosis not present

## 2019-05-07 DIAGNOSIS — H4313 Vitreous hemorrhage, bilateral: Secondary | ICD-10-CM | POA: Diagnosis not present

## 2019-05-07 DIAGNOSIS — H02831 Dermatochalasis of right upper eyelid: Secondary | ICD-10-CM | POA: Diagnosis not present

## 2019-05-07 DIAGNOSIS — H25813 Combined forms of age-related cataract, bilateral: Secondary | ICD-10-CM | POA: Diagnosis not present

## 2019-05-07 DIAGNOSIS — E113591 Type 2 diabetes mellitus with proliferative diabetic retinopathy without macular edema, right eye: Secondary | ICD-10-CM | POA: Diagnosis not present

## 2019-05-13 DIAGNOSIS — Z951 Presence of aortocoronary bypass graft: Secondary | ICD-10-CM | POA: Diagnosis not present

## 2019-05-13 LAB — BASIC METABOLIC PANEL
BUN/Creatinine Ratio: 12 (calc) (ref 6–22)
BUN: 24 mg/dL (ref 7–25)
CO2: 25 mmol/L (ref 20–32)
Calcium: 9.9 mg/dL (ref 8.6–10.3)
Chloride: 104 mmol/L (ref 98–110)
Creat: 1.94 mg/dL — ABNORMAL HIGH (ref 0.70–1.25)
Glucose, Bld: 153 mg/dL — ABNORMAL HIGH (ref 65–99)
Potassium: 4.2 mmol/L (ref 3.5–5.3)
Sodium: 138 mmol/L (ref 135–146)

## 2019-05-15 ENCOUNTER — Other Ambulatory Visit: Payer: Self-pay

## 2019-05-15 DIAGNOSIS — Z951 Presence of aortocoronary bypass graft: Secondary | ICD-10-CM

## 2019-05-15 MED ORDER — FUROSEMIDE 40 MG PO TABS: 40.0000 mg | ORAL_TABLET | Freq: Every day | ORAL | 0 refills | Status: DC

## 2019-05-15 NOTE — Telephone Encounter (Signed)
Pt called the office this AM to report persistent R leg edema. Denies redness or drainage from incision site and is afebrile. States he has been taking Lasix as prescribed and elevating legs. Dr. Kipp Brood consulted, and he recommends that pt f/u w/ his cardiologist and states he can continue taking Lasix 40 mg po x 14 additional days (refill authorization given). Called refill in to [facility]. Pt does not have f/u appt at [facility]. Dr. Kipp Brood is aware of this and states pt can see him in the office on Friday if he wants. Called pt to inform him about Dr. Abran Duke recommendation to see his cardiologist and to continue taking Lasix for an additional 14 days. Offered to make him an appointment w/ Dr. Kipp Brood on Friday, but he declines at this time. To call office prn.

## 2019-05-15 NOTE — Telephone Encounter (Signed)
Pt called office this AM reporting persistent R leg edema. Denies redness or swelling at incision site and is afebrile. States he has been taking Lasix as prescribed and elevating legs. Spoke w/ Dr. Kipp Brood, and he suggests that pt f/u w/ cardiologist and states he can take Lasix for an additional 14 days (refill authorization given). Pt does not have f/u appointment at [facility]. Dr. Kipp Brood is aware and states pt can see him in the office on Friday if he wants. TC to pt to notify of Dr. Abran Duke recommendation to see his cardiologist. Informed about Lasix refill and instructed him to continue taking daily x 14 days. Offered to schedule pt an appt w/ Dr. Kipp Brood on Friday, but he declines at this time. To call office prn. Refill of Lasix 40 mg  x 14 tabs called in to [facility].

## 2019-05-15 NOTE — Telephone Encounter (Signed)
Pt called this AM to report persistent R leg edema. Denies any redness or drainage from incision site and is afebrile. Pt has been elevating his legs and taking Lasix as prescribed. Notified Dr. Kipp Brood, and he suggests that pt f/u w/ cardiologist. States pt can continue taking Lasix for an additional 14 days, giving refill authorization. Refill of Lasix 40 mg po daily x 14 days submitted to [facility]. Pt does not have a f/u appointment @ [facility]; Dr. Kipp Brood is aware, stating pt can come for an office visit on Friday if he wants. Notified pt of Dr. Abran Duke recommendations and about Lasix refill. Offered to schedule pt an appointment w/ Dr. Kipp Brood on Friday, but he declines at this time. To call office prn.

## 2019-05-16 ENCOUNTER — Other Ambulatory Visit (INDEPENDENT_AMBULATORY_CARE_PROVIDER_SITE_OTHER): Payer: Self-pay | Admitting: *Deleted

## 2019-05-16 ENCOUNTER — Other Ambulatory Visit: Payer: Self-pay | Admitting: *Deleted

## 2019-05-16 ENCOUNTER — Telehealth: Payer: Self-pay | Admitting: Cardiovascular Disease

## 2019-05-16 ENCOUNTER — Encounter (INDEPENDENT_AMBULATORY_CARE_PROVIDER_SITE_OTHER): Payer: Self-pay | Admitting: *Deleted

## 2019-05-16 ENCOUNTER — Telehealth (INDEPENDENT_AMBULATORY_CARE_PROVIDER_SITE_OTHER): Payer: Self-pay | Admitting: *Deleted

## 2019-05-16 DIAGNOSIS — Z1211 Encounter for screening for malignant neoplasm of colon: Secondary | ICD-10-CM

## 2019-05-16 DIAGNOSIS — M7989 Other specified soft tissue disorders: Secondary | ICD-10-CM

## 2019-05-16 MED ORDER — SUPREP BOWEL PREP KIT 17.5-3.13-1.6 GM/177ML PO SOLN: 1.0000 | Freq: Once | ORAL | 0 refills | Status: AC

## 2019-05-16 NOTE — Telephone Encounter (Signed)
Pt voiced understanding - orders placed for ABI's and will forward to schedulers

## 2019-05-16 NOTE — Telephone Encounter (Signed)
Was told to contact Dr Bronson Ing about his pitting adema with both legs.   He had a procedure about 8 weeks ago and he is concerned about circulation in his feet.

## 2019-05-16 NOTE — Telephone Encounter (Signed)
Upon review of notes it appears that CT surgery prescribed a 14-day course of Lasix and check follow-up labs and plans to follow-up with him.  Have him weigh daily.  He can take an extra 40 mg of Lasix for weight gain of 3 pounds in 24 hours.  Can obtain ABIs to evaluate lower extremity circulation.

## 2019-05-16 NOTE — Telephone Encounter (Signed)
°  Precert needed for: ABI's   Location: [facility]    Date: May 21, 2019 @9 :30   Arrive at 9:15

## 2019-05-16 NOTE — Telephone Encounter (Signed)
Pt wanted it noted that he is concerned that the right leg is swelling more than the left since R leg was where vein was taken for surgery and was concerned about infection - denies any redness/tenderness - says the right leg gets warm but not hot and around the calf area feels tight to the touch with pitting whenever he uses his legs

## 2019-05-16 NOTE — Telephone Encounter (Signed)
Pt scheduled for ABI's 12/3 - also wanted noted that he went to opthalmology today for routine eye appt and was told he has some swelling in his eye as well - asked that I forward this communication to Dr Bronson Ing and [place] surgeon - aware to also f/u with pcp

## 2019-05-16 NOTE — Telephone Encounter (Signed)
Patient needs suprep [facility] sch'd 1/14

## 2019-05-16 NOTE — Telephone Encounter (Signed)
I would have him evaluated in the CT surgery office regarding this.  He could also be evaluated by his PCP in the event there is evidence of cellulitis.  Most recent lower extremity Doppler showed no evidence of DVT.

## 2019-05-16 NOTE — Telephone Encounter (Signed)
Pt says since the surgery (CABG) both legs have been swelling unless he keeps his legs elevated with right leg being worse than the left swelling going up to his knees and thighs - denies SOB/dizziness/chest pain - some fatigue - says BP/HR has been WNL - taking lasix 40 mg daily - concerned about his circulation (diabetic) and with R leg swelling more than the left - denies any numbness in limbs or fingers

## 2019-05-20 ENCOUNTER — Encounter (INDEPENDENT_AMBULATORY_CARE_PROVIDER_SITE_OTHER): Payer: Self-pay

## 2019-05-20 ENCOUNTER — Encounter: Payer: Self-pay | Admitting: "Endocrinology

## 2019-05-20 ENCOUNTER — Other Ambulatory Visit: Payer: Self-pay

## 2019-05-20 ENCOUNTER — Ambulatory Visit (INDEPENDENT_AMBULATORY_CARE_PROVIDER_SITE_OTHER): Payer: Medicaid Other | Admitting: "Endocrinology

## 2019-05-20 VITALS — BP 146/79 | Ht 70.0 in | Wt 217.0 lb

## 2019-05-20 DIAGNOSIS — E1159 Type 2 diabetes mellitus with other circulatory complications: Secondary | ICD-10-CM

## 2019-05-20 MED ORDER — GLIPIZIDE 5 MG PO TABS: 10.0000 mg | ORAL_TABLET | Freq: Two times a day (BID) | ORAL | 2 refills | Status: DC

## 2019-05-20 NOTE — Patient Instructions (Signed)

## 2019-05-20 NOTE — Progress Notes (Signed)
Endocrinology Consult Note       05/20/2019, 5:22 PM   Subjective:    Patient ID: [name], male    DOB: 01-05-1956.  [name] is being seen in consultation for management of currently uncontrolled symptomatic diabetes requested by  [name], MD.   Past Medical History:  Diagnosis Date  . Diabetes mellitus without complication ([facility])   . Hypertension   . Type 2 diabetes mellitus with diabetic nephropathy ([place]) 04/23/2019    Past Surgical History:  Procedure Laterality Date  . CORONARY ARTERY BYPASS GRAFT N/A 03/25/2019   Procedure: CORONARY ARTERY BYPASS GRAFTING (CABG) X  4 USING LEFT INTERNAL MAMMARY ARTERY AND RIGHT SAPHENOUS VEIN GRAFTS;  Surgeon: [name], MD;  Location: [facility];  Service: Open Heart Surgery;  Laterality: N/A;  . EXCISION MORTON'S NEUROMA    . RIGHT/LEFT HEART CATH AND CORONARY ANGIOGRAPHY N/A 03/12/2019   Procedure: RIGHT/LEFT HEART CATH AND CORONARY ANGIOGRAPHY;  Surgeon: [name], MD;  Location: [facility];  Service: Cardiovascular;  Laterality: N/A;  . TEE WITHOUT CARDIOVERSION N/A 03/25/2019   Procedure: TRANSESOPHAGEAL ECHOCARDIOGRAM (TEE);  Surgeon: [name], MD;  Location: [facility];  Service: Open Heart Surgery;  Laterality: N/A;    Social History   Socioeconomic History  . Marital status: Single    Spouse name: Not on file  . Number of children: Not on file  . Years of education: Not on file  . Highest education level: Not on file  Occupational History  . Not on file  Social Needs  . Financial resource strain: Not on file  . Food insecurity    Worry: Not on file    Inability: Not on file  . Transportation needs    Medical: Not on file    Non-medical: Not on file  Tobacco Use  . Smoking status: Former Smoker    Quit date: 07/06/1988    Years since quitting: 30.8  . Smokeless tobacco: Never Used   Substance and Sexual Activity  . Alcohol use: Not Currently    Comment: socially  . Drug use: No  . Sexual activity: Not on file  Lifestyle  . Physical activity    Days per week: Not on file    Minutes per session: Not on file  . Stress: Not on file  Relationships  . Social Herbalist on phone: Not on file    Gets together: Not on file    Attends religious service: Not on file    Active member of club or organization: Not on file    Attends meetings of clubs or organizations: Not on file    Relationship status: Not on file  Other Topics Concern  . Not on file  Social History Narrative  . Not on file    Family History  Problem Relation Age of Onset  . Cancer Mother   . Heart Problems Father     Outpatient Encounter Medications as of 05/20/2019  Medication Sig  . amLODipine (NORVASC) 10 MG tablet Take 1 tablet (10 mg total) by mouth daily.  Marland Kitchen aspirin  325 MG EC tablet Take 1 tablet (325 mg total) by mouth daily.  . carvedilol (COREG) 25 MG tablet Take 1 tablet (25 mg total) by mouth 2 (two) times daily with a meal.  . Cyanocobalamin (B-12) 2500 MCG TABS Take 2,500 mcg by mouth daily.  . diphenhydramine-acetaminophen (TYLENOL PM) 25-500 MG TABS tablet Take 1 tablet by mouth at bedtime as needed (sleep).  . furosemide (LASIX) 40 MG tablet Take 1 tablet (40 mg total) by mouth daily.  Marland Kitchen glipiZIDE (GLUCOTROL) 5 MG tablet Take 2 tablets (10 mg total) by mouth 2 (two) times daily before a meal.  . hydrALAZINE (APRESOLINE) 100 MG tablet Take 1 tablet (100 mg total) by mouth 2 (two) times daily.  . Multiple Vitamin (MULTIVITAMIN WITH MINERALS) TABS tablet Take 1 tablet by mouth daily.  . rosuvastatin (CRESTOR) 10 MG tablet Take 1 tablet (10 mg total) by mouth daily.  . [DISCONTINUED] glipiZIDE (GLUCOTROL) 10 MG tablet Take 1 tablet (10 mg total) by mouth 2 (two) times daily.  Marland Kitchen acetaminophen (TYLENOL) 500 MG tablet Take 1,000 mg by mouth every 6 (six) hours as needed for  moderate pain or headache.  . [DISCONTINUED] oxyCODONE (OXY IR/ROXICODONE) 5 MG immediate release tablet Take 1 tablet (5 mg total) by mouth every 6 (six) hours as needed for severe pain. (Patient not taking: Reported on 05/03/2019)  . [DISCONTINUED] promethazine (PHENERGAN) 12.5 MG tablet Take 1 tablet (12.5 mg total) by mouth every 6 (six) hours as needed for nausea or vomiting. (Patient not taking: Reported on 05/03/2019)   No facility-administered encounter medications on file as of 05/20/2019.     ALLERGIES: Allergies  Allergen Reactions  . Bee Venom Anaphylaxis    VACCINATION STATUS: Immunization History  Administered Date(s) Administered  . Pneumococcal Polysaccharide-23 07/16/2018  . Zoster Recombinat (Shingrix) 04/25/2019    Diabetes He presents for his initial diabetic visit. He has type 2 diabetes mellitus. Onset time: He was diagnosed at approximate age of 63 years. His disease course has been fluctuating. There are no hypoglycemic associated symptoms. Pertinent negatives for hypoglycemia include no confusion, headaches, pallor or seizures. There are no diabetic associated symptoms. Pertinent negatives for diabetes include no chest pain, no fatigue, no polydipsia, no polyphagia, no polyuria and no weakness. There are no hypoglycemic complications. Diabetic complications include heart disease and nephropathy. (Patient is a status post quadruple bypass surgery in October 2019.) Risk factors for coronary artery disease include dyslipidemia, diabetes mellitus, family history, obesity, hypertension, male sex, tobacco exposure and sedentary lifestyle. Current diabetic treatments: Is currently on glipizide 10 mg p.o. twice daily. His weight is fluctuating minimally. He is following a generally unhealthy diet. When asked about meal planning, he reported none. He has had a previous visit with a dietitian. He participates in exercise intermittently. His home blood glucose trend is fluctuating  minimally. (He did not bring any logs nor meter to review.  His most recent A1c was 8.2% in October 2020.) An ACE inhibitor/angiotensin II receptor blocker is being taken.  Hyperlipidemia This is a chronic problem. The current episode started more than 1 year ago. The problem is controlled. Exacerbating diseases include chronic renal disease, diabetes and obesity. Pertinent negatives include no chest pain, myalgias or shortness of breath. Current antihyperlipidemic treatment includes statins. Risk factors for coronary artery disease include family history, dyslipidemia, diabetes mellitus, hypertension, male sex, obesity and a sedentary lifestyle.  Hypertension This is a chronic problem. The current episode started more than 1 year ago. The problem is  controlled. Pertinent negatives include no chest pain, headaches, neck pain, palpitations or shortness of breath. Risk factors for coronary artery disease include diabetes mellitus, dyslipidemia and male gender. Past treatments include angiotensin blockers. Hypertensive end-organ damage includes kidney disease and CAD/MI. Identifiable causes of hypertension include chronic renal disease.     Review of Systems  Constitutional: Negative for chills, fatigue, fever and unexpected weight change.  HENT: Negative for dental problem, mouth sores and trouble swallowing.   Eyes: Negative for visual disturbance.  Respiratory: Negative for cough, choking, chest tightness, shortness of breath and wheezing.   Cardiovascular: Negative for chest pain, palpitations and leg swelling.  Gastrointestinal: Negative for abdominal distention, abdominal pain, constipation, diarrhea, nausea and vomiting.  Endocrine: Negative for polydipsia, polyphagia and polyuria.  Genitourinary: Negative for dysuria, flank pain, hematuria and urgency.  Musculoskeletal: Negative for back pain, gait problem, myalgias and neck pain.  Skin: Negative for pallor, rash and wound.  Neurological:  Negative for seizures, syncope, weakness, numbness and headaches.  Psychiatric/Behavioral: Negative for confusion and dysphoric mood.    Objective:    BP (!) 146/79   Ht 5\' 10"  (1.778 m)   Wt 217 lb (98.4 kg)   BMI 31.14 kg/m   Wt Readings from Last 3 Encounters:  05/20/19 217 lb (98.4 kg)  05/03/19 221 lb (100.2 kg)  04/23/19 216 lb (98 kg)     Physical Exam Constitutional:      General: He is not in acute distress.    Appearance: He is well-developed.  HENT:     Head: Normocephalic and atraumatic.  Neck:     Musculoskeletal: Normal range of motion and neck supple.     Thyroid: No thyromegaly.     Trachea: No tracheal deviation.  Cardiovascular:     Pulses:          Dorsalis pedis pulses are 1+ on the right side and 1+ on the left side.       Posterior tibial pulses are 1+ on the right side and 1+ on the left side.     Heart sounds: S1 normal and S2 normal. No murmur. No gallop.   Pulmonary:     Effort: Pulmonary effort is normal. No respiratory distress.     Breath sounds: No wheezing.  Abdominal:     General: There is no distension.     Tenderness: There is no abdominal tenderness. There is no guarding.  Musculoskeletal:     Right shoulder: He exhibits no swelling and no deformity.     Right lower leg: Edema present.     Left lower leg: Edema present.  Skin:    General: Skin is warm and dry.     Findings: No rash.     Nails: There is no clubbing.   Neurological:     Mental Status: He is alert and oriented to person, place, and time.     Cranial Nerves: No cranial nerve deficit.     Sensory: No sensory deficit.     Gait: Gait normal.     Deep Tendon Reflexes: Reflexes are normal and symmetric.  Psychiatric:        Speech: Speech normal.        Behavior: Behavior normal. Behavior is cooperative.        Thought Content: Thought content normal.        Judgment: Judgment normal.     CMP ( most recent) CMP     Component Value Date/Time   NA 138 05/13/2019  1029  NA 139 04/23/2019 1358   K 4.2 05/13/2019 1029   CL 104 05/13/2019 1029   CO2 25 05/13/2019 1029   GLUCOSE 153 (H) 05/13/2019 1029   BUN 24 05/13/2019 1029   BUN 23 04/23/2019 1358   CREATININE 1.94 (H) 05/13/2019 1029   CALCIUM 9.9 05/13/2019 1029   PROT 6.7 03/21/2019 1526   ALBUMIN 3.9 03/21/2019 1526   AST 19 03/21/2019 1526   ALT 20 03/21/2019 1526   ALKPHOS 56 03/21/2019 1526   BILITOT 0.4 03/21/2019 1526   GFRNONAA 38 (L) 04/23/2019 1358   GFRAA 44 (L) 04/23/2019 1358     Diabetic Labs (most recent): Lab Results  Component Value Date   HGBA1C 8.2 (H) 03/25/2019   HGBA1C 6.8 (A) 03/18/2019   HGBA1C 8.1 (A) 11/13/2018     Lipid Panel ( most recent) Lipid Panel     Component Value Date/Time   CHOL 152 12/31/2018 0958   TRIG 172 (H) 12/31/2018 0958   HDL 41 12/31/2018 0958   CHOLHDL 3.9 04/26/2018 1011   VLDL 20 04/26/2018 1011   LDLCALC 77 12/31/2018 0958   LABVLDL 34 12/31/2018 0958      Lab Results  Component Value Date   TSH 2.110 04/29/2018           Assessment & Plan:   1. DM type 2 causing vascular disease ([place])  - [name] has currently uncontrolled symptomatic type 2 DM since  63 years of age,  with most recent A1c of 8.2 %. Recent labs reviewed. - I had a long discussion with him about the progressive nature of diabetes and the pathology behind its complications. -his diabetes is complicated by coronary artery disease, CKD and he remains at a high risk for more acute and chronic complications which include CAD, CVA, CKD, retinopathy, and neuropathy. These are all discussed in detail with him.  - I have counseled him on diet  and weight management  by adopting a carbohydrate restricted/protein rich diet. Patient is encouraged to switch to  unprocessed or minimally processed     complex starch and increased protein intake (animal or plant source), fruits, and vegetables. -  he is advised to stick to a routine mealtimes to eat 3  meals  a day and avoid unnecessary snacks ( to snack only to correct hypoglycemia).   - he admits that there is a room for improvement in his food and drink choices. - Suggestion is made for him to avoid simple carbohydrates  from his diet including Cakes, Sweet Desserts, Ice Cream, Soda (diet and regular), Sweet Tea, Candies, Chips, Cookies, Store Bought Juices, Alcohol in Excess of  1-2 drinks a day, Artificial Sweeteners,  Coffee Creamer, and "Sugar-free" Products. This will help patient to have more stable blood glucose profile and potentially avoid unintended weight gain.  - he will be scheduled with Jearld Fenton, RDN, CDE for diabetes education.  - I have approached him with the following individualized plan to manage  his diabetes and patient agrees:   -He may need at least basal insulin in order for him to control diabetes to target given his coronary artery disease and CKD. -He is approached to initiate  strict monitoring of glucose 4 times a day-before meals and at bedtime, and he will be on phone visit in 10 days.  - he is encouraged to call clinic for blood glucose levels less than 70 or above 300 mg /dl. - he is advised to lower his glipizide to  5 mg p.o. twice daily with breakfast and supper.  - he is not a candidate for Metformin, SGLT2 inhibitors due to concurrent renal insufficiency.  - he will be considered for incretin therapy as appropriate next visit.  - Specific targets for  A1c;  LDL, HDL, Triglycerides, and  Waist Circumference were discussed with the patient.  2) Blood Pressure /Hypertension:  his blood pressure is  uncontrolled to target.   he is advised to continue his current medications including hydralazine 100 mg p.o. twice daily, losartan, amlodipine 10 mg p.o. daily, carvedilol 25 mg p.o. twice daily.  3) Lipids/Hyperlipidemia:   Review of his recent lipid panel showed  controlled  LDL at 77 .  he  is advised to continue    rosuvastatin 10 mg p.o. nightly.    Side effects and precautions discussed with him.  4)  Weight/Diet:  Body mass index is 31.14 kg/m.  -   clearly complicating his diabetes care.   he is  a candidate for weight loss. I discussed with him the fact that loss of 5 - 10% of his  current body weight will have the most impact on his diabetes management.  Exercise, and detailed carbohydrates information provided  -  detailed on discharge instructions.  5) Chronic Care/Health Maintenance:  -he  is on ACEI/ARB and Statin medications and  is encouraged to initiate and continue to follow up with Ophthalmology, Dentist,  Podiatrist at least yearly or according to recommendations, and advised to  stay away from smoking. I have recommended yearly flu vaccine and pneumonia vaccine at least every 5 years; moderate intensity exercise for up to 150 minutes weekly; and  sleep for at least 7 hours a day.  - he is  advised to maintain close follow up with [name], MD for primary care needs, as well as his other providers for optimal and coordinated care.  - Time spent with the patient: 45 minutes, of which >50% was spent in obtaining information about his symptoms, reviewing his previous labs/studies, evaluations, and treatments, counseling him about his currently uncontrolled, complicated type 2 diabetes, hyperlipidemia, hypertension, and developing plans for long term treatment based on the latest standards of care/guidelines.  Please refer to " Patient Self Inventory" in the Media  tab for reviewed elements of pertinent patient history.  Reita Cliche [place] participated in the discussions, expressed understanding, and voiced agreement with the above plans.  All questions were answered to his satisfaction. he is encouraged to contact clinic should he have any questions or concerns prior to his return visit.  Follow up plan: - Return in about 10 days (around 05/30/2019), or phone, for Follow up with Meter and Logs Only - no Labs.  Glade Lloyd,  MD [facility] [address] [place], [place] [address] Phone: [phone]  Fax: [phone]    05/20/2019, 5:22 PM  This note was partially dictated with voice recognition software. Similar sounding words can be transcribed inadequately or may not  be corrected upon review.

## 2019-05-21 ENCOUNTER — Telehealth: Payer: Self-pay | Admitting: Cardiovascular Disease

## 2019-05-21 ENCOUNTER — Ambulatory Visit (HOSPITAL_COMMUNITY)
Admission: RE | Admit: 2019-05-21 | Discharge: 2019-05-21 | Disposition: A | Discharge status: Home / Self Care | Payer: Medicaid Other | Source: Ambulatory Visit | Attending: Cardiovascular Disease | Admitting: Cardiovascular Disease

## 2019-05-21 ENCOUNTER — Telehealth: Payer: Self-pay

## 2019-05-21 DIAGNOSIS — M7989 Other specified soft tissue disorders: Secondary | ICD-10-CM

## 2019-05-21 NOTE — Telephone Encounter (Signed)
-----   Message from Herminio Commons, MD sent at 05/21/2019  4:01 PM EST ----- Possible blockage in left leg in spite of normal values as per interpreting MD. Please make referral to Dr. Quay Burow.

## 2019-05-21 NOTE — Telephone Encounter (Signed)
Patient notified, awaits call from Dr.Berry's office

## 2019-05-21 NOTE — Telephone Encounter (Signed)
Patient called requesting to speak to nurse in regards to test performed at [facility] this am.

## 2019-05-21 NOTE — Telephone Encounter (Signed)
Pt had ABI's done today and wanted it noted that technician doing test said he did have edema in both legs with pitting and that right leg was worse and warmer than left leg - pt has been weighing daily and said he has lost 3-4 lbs since 12/3 and is taking lasix 40 mg daily - aware that Dr Bronson Ing would let him know about results when they became available - test was just done today

## 2019-05-22 DIAGNOSIS — Z7984 Long term (current) use of oral hypoglycemic drugs: Secondary | ICD-10-CM | POA: Diagnosis not present

## 2019-05-22 DIAGNOSIS — E113532 Type 2 diabetes mellitus with proliferative diabetic retinopathy with traction retinal detachment not involving the macula, left eye: Secondary | ICD-10-CM | POA: Diagnosis not present

## 2019-05-22 DIAGNOSIS — Z48810 Encounter for surgical aftercare following surgery on the sense organs: Secondary | ICD-10-CM | POA: Diagnosis not present

## 2019-05-22 DIAGNOSIS — H25813 Combined forms of age-related cataract, bilateral: Secondary | ICD-10-CM | POA: Diagnosis not present

## 2019-05-22 DIAGNOSIS — E113591 Type 2 diabetes mellitus with proliferative diabetic retinopathy without macular edema, right eye: Secondary | ICD-10-CM | POA: Diagnosis not present

## 2019-05-22 DIAGNOSIS — E1136 Type 2 diabetes mellitus with diabetic cataract: Secondary | ICD-10-CM | POA: Diagnosis not present

## 2019-05-23 ENCOUNTER — Encounter (INDEPENDENT_AMBULATORY_CARE_PROVIDER_SITE_OTHER): Payer: Self-pay | Admitting: *Deleted

## 2019-05-24 ENCOUNTER — Telehealth: Payer: Self-pay | Admitting: Cardiovascular Disease

## 2019-05-24 NOTE — Telephone Encounter (Signed)
The swelling would not have anything to do with the arterial blockage in his leg that Dr [name] is going to evaluate. Swelling in the legs is usually either due to issues with the veins (as opposed to the arteries) in the legs or possibly signs of heart failure. Can he get a PA appt next week.   Zandra Abts MD

## 2019-05-24 NOTE — Telephone Encounter (Signed)
Patient called [place] [facility] stating that he has not heard anything from referral to Dr. [name]. States that the edema in his legs are worse. Called [facility] trying to set up an appointment. Dr.Berry is on vacation. The scheduler wanted to see if it will be ok for PA to see patient. Patient is wanting someone to see him asap.

## 2019-05-27 ENCOUNTER — Telehealth: Payer: Self-pay

## 2019-05-27 ENCOUNTER — Ambulatory Visit (INDEPENDENT_AMBULATORY_CARE_PROVIDER_SITE_OTHER): Payer: Medicaid Other | Admitting: Physician Assistant

## 2019-05-27 ENCOUNTER — Other Ambulatory Visit: Payer: Self-pay

## 2019-05-27 ENCOUNTER — Encounter: Payer: Self-pay | Admitting: Physician Assistant

## 2019-05-27 ENCOUNTER — Other Ambulatory Visit (HOSPITAL_COMMUNITY)
Admission: RE | Admit: 2019-05-27 | Discharge: 2019-05-27 | Disposition: A | Discharge status: Home / Self Care | Payer: Medicaid Other | Source: Ambulatory Visit | Attending: Physician Assistant | Admitting: Physician Assistant

## 2019-05-27 VITALS — BP 162/85 | HR 71 | Temp 98.1°F | Ht 70.0 in | Wt 210.0 lb

## 2019-05-27 DIAGNOSIS — I2510 Atherosclerotic heart disease of native coronary artery without angina pectoris: Secondary | ICD-10-CM

## 2019-05-27 DIAGNOSIS — I10 Essential (primary) hypertension: Secondary | ICD-10-CM | POA: Diagnosis not present

## 2019-05-27 DIAGNOSIS — N183 Chronic kidney disease, stage 3 unspecified: Secondary | ICD-10-CM

## 2019-05-27 DIAGNOSIS — E785 Hyperlipidemia, unspecified: Secondary | ICD-10-CM | POA: Diagnosis not present

## 2019-05-27 DIAGNOSIS — I25708 Atherosclerosis of coronary artery bypass graft(s), unspecified, with other forms of angina pectoris: Secondary | ICD-10-CM | POA: Insufficient documentation

## 2019-05-27 DIAGNOSIS — E1121 Type 2 diabetes mellitus with diabetic nephropathy: Secondary | ICD-10-CM | POA: Diagnosis not present

## 2019-05-27 DIAGNOSIS — R609 Edema, unspecified: Secondary | ICD-10-CM | POA: Diagnosis not present

## 2019-05-27 DIAGNOSIS — Z951 Presence of aortocoronary bypass graft: Secondary | ICD-10-CM | POA: Diagnosis not present

## 2019-05-27 LAB — BASIC METABOLIC PANEL
Anion gap: 9 (ref 5–15)
BUN: 37 mg/dL — ABNORMAL HIGH (ref 8–23)
CO2: 23 mmol/L (ref 22–32)
Calcium: 9 mg/dL (ref 8.9–10.3)
Chloride: 98 mmol/L (ref 98–111)
Creatinine, Ser: 2.22 mg/dL — ABNORMAL HIGH (ref 0.61–1.24)
GFR calc Af Amer: 35 mL/min — ABNORMAL LOW (ref >60)
GFR calc non Af Amer: 31 mL/min — ABNORMAL LOW (ref >60)
Glucose, Bld: 218 mg/dL — ABNORMAL HIGH (ref 70–99)
Potassium: 3.9 mmol/L (ref 3.5–5.1)
Sodium: 130 mmol/L — ABNORMAL LOW (ref 135–145)

## 2019-05-27 MED ORDER — FUROSEMIDE 40 MG PO TABS: 40.0000 mg | ORAL_TABLET | Freq: Every day | ORAL | 3 refills | Status: DC

## 2019-05-27 MED ORDER — FUROSEMIDE 40 MG PO TABS: ORAL_TABLET | ORAL | 3 refills | Status: DC

## 2019-05-27 MED ORDER — HYDRALAZINE HCL 100 MG PO TABS: 100.0000 mg | ORAL_TABLET | Freq: Three times a day (TID) | ORAL | 3 refills | Status: DC

## 2019-05-27 MED ORDER — AMLODIPINE BESYLATE 5 MG PO TABS: 5.0000 mg | ORAL_TABLET | Freq: Every day | ORAL | 3 refills | Status: DC

## 2019-05-27 NOTE — Patient Instructions (Signed)
Medication Instructions:  Continue lasix 40 mg daily   Decrease AMLODIPINE to 5 mg daily   Increase HYDRALAZINE to 100 mg THREE times daily   Labwork: TODAY  bmet   Testing/Procedures: Your physician has requested that you have an echocardiogram. Echocardiography is a painless test that uses sound waves to create images of your heart. It provides your doctor with information about the size and shape of your heart and how well your heart's chambers and valves are working. This procedure takes approximately one hour. There are no restrictions for this procedure.   Follow-Up: Your physician recommends that you schedule a follow-up appointment in: 2-3 WEEKS    Any Other Special Instructions Will Be Listed Below (If Applicable).   PLEASE KEEP A BLOOD PRESSURE LOG AND BRING IT TO NEXT OFFICE VISIT     If you need a refill on your cardiac medications before your next appointment, please call your pharmacy. Two Gram Sodium Diet 2000 mg  What is Sodium? Sodium is a mineral found naturally in many foods. The most significant source of sodium in the diet is table salt, which is about 40% sodium.  Processed, convenience, and preserved foods also contain a large amount of sodium.  The body needs only 500 mg of sodium daily to function,  A normal diet provides more than enough sodium even if you do not use salt.  Why Limit Sodium? A build up of sodium in the body can cause thirst, increased blood pressure, shortness of breath, and water retention.  Decreasing sodium in the diet can reduce edema and risk of heart attack or stroke associated with high blood pressure.  Keep in mind that there are many other factors involved in these health problems.  Heredity, obesity, lack of exercise, cigarette smoking, stress and what you eat all play a role.  General Guidelines:  Do not add salt at the table or in cooking.  One teaspoon of salt contains over 2 grams of sodium.  Read food labels  Avoid  processed and convenience foods  Ask your dietitian before eating any foods not dicussed in the menu planning guidelines  Consult your physician if you wish to use a salt substitute or a sodium containing medication such as antacids.  Limit milk and milk products to 16 oz (2 cups) per day.  Shopping Hints:  READ LABELS!! "Dietetic" does not necessarily mean low sodium.  Salt and other sodium ingredients are often added to foods during processing.   Menu Planning Guidelines Food Group Choose More Often Avoid  Beverages (see also the milk group All fruit juices, low-sodium, salt-free vegetables juices, low-sodium carbonated beverages Regular vegetable or tomato juices, commercially softened water used for drinking or cooking  Breads and Cereals Enriched white, wheat, rye and pumpernickel bread, hard rolls and dinner rolls; muffins, cornbread and waffles; most dry cereals, cooked cereal without added salt; unsalted crackers and breadsticks; low sodium or homemade bread crumbs Bread, rolls and crackers with salted tops; quick breads; instant hot cereals; pancakes; commercial bread stuffing; self-rising flower and biscuit mixes; regular bread crumbs or cracker crumbs  Desserts and Sweets Desserts and sweets mad with mild should be within allowance Instant pudding mixes and cake mixes  Fats Butter or margarine; vegetable oils; unsalted salad dressings, regular salad dressings limited to 1 Tbs; light, sour and heavy cream Regular salad dressings containing bacon fat, bacon bits, and salt pork; snack dips made with instant soup mixes or processed cheese; salted nuts  Fruits Most fresh, frozen  and canned fruits Fruits processed with salt or sodium-containing ingredient (some dried fruits are processed with sodium sulfites        Vegetables Fresh, frozen vegetables and low- sodium canned vegetables Regular canned vegetables, sauerkraut, pickled vegetables, and others prepared in brine; frozen  vegetables in sauces; vegetables seasoned with ham, bacon or salt pork  Condiments, Sauces, Miscellaneous  Salt substitute with physician's approval; pepper, herbs, spices; vinegar, lemon or lime juice; hot pepper sauce; garlic powder, onion powder, low sodium soy sauce (1 Tbs.); low sodium condiments (ketchup, chili sauce, mustard) in limited amounts (1 tsp.) fresh ground horseradish; unsalted tortilla chips, pretzels, potato chips, popcorn, salsa (1/4 cup) Any seasoning made with salt including garlic salt, celery salt, onion salt, and seasoned salt; sea salt, rock salt, kosher salt; meat tenderizers; monosodium glutamate; mustard, regular soy sauce, barbecue, sauce, chili sauce, teriyaki sauce, steak sauce, Worcestershire sauce, and most flavored vinegars; canned gravy and mixes; regular condiments; salted snack foods, olives, picles, relish, horseradish sauce, catsup   Food preparation: Try these seasonings Meats:    Pork Sage, onion Serve with applesauce  Chicken Poultry seasoning, thyme, parsley Serve with cranberry sauce  Lamb Curry powder, rosemary, garlic, thyme Serve with mint sauce or jelly  Veal Marjoram, basil Serve with current jelly, cranberry sauce  Beef Pepper, bay leaf Serve with dry mustard, unsalted chive butter  Fish [place] leaf, dill Serve with unsalted lemon butter, unsalted parsley butter  Vegetables:    Asparagus Lemon juice   Broccoli Lemon juice   Carrots Mustard dressing parsley, mint, nutmeg, glazed with unsalted butter and sugar   Green beans Marjoram, lemon juice, nutmeg,dill seed   Tomatoes Basil, marjoram, onion   Spice /blend for [employer]" 4 tsp ground thyme 1 tsp ground sage 3 tsp ground rosemary 4 tsp ground marjoram   Test your knowledge 1. A product that says "Salt Free" may still contain sodium. True or False 2. Garlic Powder and Hot Pepper Sauce an be used as alternative seasonings.True or False 3. Processed foods have more sodium than fresh foods.   True or False 4. Canned Vegetables have less sodium than froze True or False  WAYS TO DECREASE YOUR SODIUM INTAKE 1. Avoid the use of added salt in cooking and at the table.  Table salt (and other prepared seasonings which contain salt) is probably one of the greatest sources of sodium in the diet.  Unsalted foods can gain flavor from the sweet, sour, and butter taste sensations of herbs and spices.  Instead of using salt for seasoning, try the following seasonings with the foods listed.  Remember: how you use them to enhance natural food flavors is limited only by your creativity... Allspice-Meat, fish, eggs, fruit, peas, red and yellow vegetables Almond Extract-Fruit baked goods Anise Seed-Sweet breads, fruit, carrots, beets, cottage cheese, cookies (tastes like licorice) Basil-Meat, fish, eggs, vegetables, rice, vegetables salads, soups, sauces Bay Leaf-Meat, fish, stews, poultry Burnet-Salad, vegetables (cucumber-like flavor) Caraway Seed-Bread, cookies, cottage cheese, meat, vegetables, cheese, rice Cardamon-Baked goods, fruit, soups Celery Powder or seed-Salads, salad dressings, sauces, meatloaf, soup, bread.Do not use  celery salt Chervil-Meats, salads, fish, eggs, vegetables, cottage cheese (parsley-like flavor) Chili Power-Meatloaf, chicken cheese, corn, eggplant, egg dishes Chives-Salads cottage cheese, egg dishes, soups, vegetables, sauces Cilantro-Salsa, casseroles Cinnamon-Baked goods, fruit, pork, lamb, chicken, carrots Cloves-Fruit, baked goods, fish, pot roast, green beans, beets, carrots Coriander-Pastry, cookies, meat, salads, cheese (lemon-orange flavor) Cumin-Meatloaf, fish,cheese, eggs, cabbage,fruit pie (caraway flavor) [employer], fruit, eggs, fish, poultry, cottage cheese,  vegetables Dill Seed-Meat, cottage cheese, poultry, vegetables, fish, salads, bread Fennel Seed-Bread, cookies, apples, pork, eggs, fish, beets, cabbage, cheese, Licorice-like  flavor Garlic-(buds or powder) Salads, meat, poultry, fish, bread, butter, vegetables, potatoes.Do not  use garlic salt Ginger-Fruit, vegetables, baked goods, meat, fish, poultry Horseradish Root-Meet, vegetables, butter Lemon Juice or Extract-Vegetables, fruit, tea, baked goods, fish salads Mace-Baked goods fruit, vegetables, fish, poultry (taste like nutmeg) Maple Extract-Syrups Marjoram-Meat, chicken, fish, vegetables, breads, green salads (taste like Sage) Mint-Tea, lamb, sherbet, vegetables, desserts, carrots, cabbage Mustard, Dry or Seed-Cheese, eggs, meats, vegetables, poultry Nutmeg-Baked goods, fruit, chicken, eggs, vegetables, desserts Onion Powder-Meat, fish, poultry, vegetables, cheese, eggs, bread, rice salads (Do not use   Onion salt) Orange Extract-Desserts, baked goods Oregano-Pasta, eggs, cheese, onions, pork, lamb, fish, chicken, vegetables, green salads Paprika-Meat, fish, poultry, eggs, cheese, vegetables Parsley Flakes-Butter, vegetables, meat fish, poultry, eggs, bread, salads (certain forms may   Contain sodium Pepper-Meat fish, poultry, vegetables, eggs Peppermint Extract-Desserts, baked goods Poppy Seed-Eggs, bread, cheese, fruit dressings, baked goods, noodles, vegetables, cottage  [employer], poultry, meat, fish, cauliflower, turnips,eggs bread Saffron-Rice, bread, veal, chicken, fish, eggs Sage-Meat, fish, poultry, onions, eggplant, tomateos, pork, stews Savory-Eggs, salads, poultry, meat, rice, vegetables, soups, pork Tarragon-Meat, poultry, fish, eggs, butter, vegetables (licorice-like flavor)  Thyme-Meat, poultry, fish, eggs, vegetables, (clover-like flavor), sauces, soups Tumeric-Salads, butter, eggs, fish, rice, vegetables (saffron-like flavor) Vanilla Extract-Baked goods, candy Vinegar-Salads, vegetables, meat marinades Walnut Extract-baked goods, candy  2. Choose your Foods Wisely   The following is a list  of foods to avoid which are high in sodium:  Meats-Avoid all smoked, canned, salt cured, dried and kosher meat and fish as well as Anchovies   [employer] meats:Bologna, Liverwurst, Pastrami Canned meat or fish  Marinated herring Caviar    Pepperoni Corned Beef   Pizza Dried chipped beef  Salami Frozen breaded fish or meat Salt pork Frankfurters or hot dogs  Sardines Gefilte fish   Sausage Ham (boiled ham, Proscuitto Smoked butt    spiced ham)   Spam      TV Dinners Vegetables Canned vegetables (Regular) Relish Canned mushrooms  Sauerkraut Olives    Tomato juice Pickles  Bakery and Dessert Products Canned puddings  Cream pies Cheesecake   Decorated cakes Cookies  Beverages/Juices Tomato juice, regular  Gatorade   V-8 vegetable juice, regular  Breads and Cereals Biscuit mixes   Salted potato chips, corn chips, pretzels Bread stuffing mixes  Salted crackers and rolls Pancake and waffle mixes Self-rising flour  Seasonings Accent    Meat sauces Barbecue sauce  Meat tenderizer Catsup    Monosodium glutamate (MSG) Celery salt   Onion salt Chili sauce   Prepared mustard Garlic salt   Salt, seasoned salt, sea salt Gravy mixes   Soy sauce Horseradish   Steak sauce Ketchup   Tartar sauce Lite salt    Teriyaki sauce Marinade mixes   Worcestershire sauce  Others Baking powder   Cocoa and cocoa mixes Baking soda   Commercial casserole mixes Candy-caramels, chocolate  Dehydrated soups    Bars, fudge,nougats  Instant rice and pasta mixes Canned broth or soup  Maraschino cherries Cheese, aged and processed cheese and cheese spreads  Learning Assessment Quiz  Indicated T (for True) or F (for False) for each of the following statements:  1. _____ Fresh fruits and vegetables and unprocessed grains are generally low in sodium 2. _____ Water may contain a considerable amount of sodium, depending on the source 3.  _____ Dennis Bast can always tell if a food is high in sodium  by tasting it 4. _____ Certain laxatives my be high in sodium and should be avoided unless prescribed   by a physician or pharmacist 5. _____ Salt substitutes may be used freely by anyone on a sodium restricted diet 6. _____ Sodium is present in table salt, food additives and as a natural component of   most foods 7. _____ Table salt is approximately 90% sodium 8. _____ Limiting sodium intake may help prevent excess fluid accumulation in the body 9. _____ On a sodium-restricted diet, seasonings such as bouillon soy sauce, and    cooking wine should be used in place of table salt 10. _____ On an ingredient list, a product which lists monosodium glutamate as the first   ingredient is an appropriate food to include on a low sodium diet  Circle the best answer(s) to the following statements (Hint: there may be more than one correct answer)  11. On a low-sodium diet, some acceptable snack items are:    A. Olives  F. Bean dip   K. Grapefruit juice    B. Salted Pretzels G. Commercial Popcorn   L. Canned peaches    C. Carrot Sticks  H. Bouillon   M. Unsalted nuts   D. [place] fries  I. Peanut butter crackers N. Salami   E. Sweet pickles J. Tomato Juice   O. Pizza  12.  Seasonings that may be used freely on a reduced - sodium diet include   A. Lemon wedges F.Monosodium glutamate K. Celery seed    B.Soysauce   G. Pepper   L. Mustard powder   C. Sea salt  H. Cooking wine  M. Onion flakes   D. Vinegar  E. Prepared horseradish N. Salsa   E. Sage   J. Worcestershire sauce  O. Chutney

## 2019-05-27 NOTE — Telephone Encounter (Signed)
Pt notified, will only take lasix for weight gain

## 2019-05-27 NOTE — Progress Notes (Signed)
Cardiology Office Note    Date:  05/27/2019   ID:  [name], DOB [birth year], MRN [id number]  PCP:  [name], MD  Cardiologist: [name], MD EPS: None  Chief Complaint  Patient presents with  . Leg Swelling    History of Present Illness:  [name] is a 63 y.o. male with history of hypertension, diabetes, HLD, CKD stage III creatinine 1.04 05/13/2019.  CAD status post CABG 03/25/2019 with LIMA to the LAD, SVG to PLV, OM2 and diagonal 1.  TEE Intra-Op normal LVEF 50 to 55% with mild LVH and mild mitral and tricuspid regurgitation.  Patient saw [name] 04/05/2019 and was doing well and going to do cardiac rehab.  Patient later called in because of ongoing leg swelling and [name] ordered arterial ABIs.  There was abnormal arterial waveforms of the left ankle despite normal ABI suggesting a degree of arterial occlusive disease at rest.  This could be artificially elevated in diabetics due to arterial medial calcification but he was referred to Dr. [name] for further evaluation.  Patient continued to have leg swelling and was placed on my schedule today. He actually saw Dr. Kipp Brood and was given a 14 day course of Lasix and his edema is down. Weight down  14 lbs. Exercising some now but hasn't started cardiac rehab. Watching salt closely.  Blood pressures run in the 1 50-1 60 range at home.  Patient has been on Norvasc 10 mg since surgery.  Past Medical History:  Diagnosis Date  . Diabetes mellitus without complication ([place])   . Hypertension   . Type 2 diabetes mellitus with diabetic nephropathy ([place]) 04/23/2019    Past Surgical History:  Procedure Laterality Date  . CORONARY ARTERY BYPASS GRAFT N/A 03/25/2019   Procedure: CORONARY ARTERY BYPASS GRAFTING (CABG) X  4 USING LEFT INTERNAL MAMMARY ARTERY AND RIGHT SAPHENOUS VEIN GRAFTS;  Surgeon: [name], MD;  Location: [facility];  Service: Open Heart Surgery;  Laterality: N/A;  .  EXCISION [facility]'S NEUROMA    . RIGHT/LEFT HEART CATH AND CORONARY ANGIOGRAPHY N/A 03/12/2019   Procedure: RIGHT/LEFT HEART CATH AND CORONARY ANGIOGRAPHY;  Surgeon: [name], MD;  Location: [facility];  Service: Cardiovascular;  Laterality: N/A;  . TEE WITHOUT CARDIOVERSION N/A 03/25/2019   Procedure: TRANSESOPHAGEAL ECHOCARDIOGRAM (TEE);  Surgeon: [name], MD;  Location: [facility];  Service: Open Heart Surgery;  Laterality: N/A;    Current Medications: Current Meds  Medication Sig  . acetaminophen (TYLENOL) 500 MG tablet Take 1,000 mg by mouth every 6 (six) hours as needed for moderate pain or headache.  Marland Kitchen aspirin 325 MG EC tablet Take 1 tablet (325 mg total) by mouth daily.  . carvedilol (COREG) 25 MG tablet Take 1 tablet (25 mg total) by mouth 2 (two) times daily with a meal.  . Cyanocobalamin (B-12) 2500 MCG TABS Take 2,500 mcg by mouth daily.  . diphenhydramine-acetaminophen (TYLENOL PM) 25-500 MG TABS tablet Take 1 tablet by mouth at bedtime as needed (sleep).  . furosemide (LASIX) 40 MG tablet Take 1 tablet (40 mg total) by mouth daily.  Marland Kitchen glipiZIDE (GLUCOTROL) 5 MG tablet Take 2 tablets (10 mg total) by mouth 2 (two) times daily before a meal.  . hydrALAZINE (APRESOLINE) 100 MG tablet Take 1 tablet (100 mg total) by mouth 3 (three) times daily.  . Multiple Vitamin (MULTIVITAMIN WITH MINERALS) TABS tablet Take 1 tablet by mouth daily.  . rosuvastatin (CRESTOR) 10  MG tablet Take 1 tablet (10 mg total) by mouth daily.  . [DISCONTINUED] amLODipine (NORVASC) 10 MG tablet Take 1 tablet (10 mg total) by mouth daily.  . [DISCONTINUED] furosemide (LASIX) 40 MG tablet Take 1 tablet (40 mg total) by mouth daily.  . [DISCONTINUED] hydrALAZINE (APRESOLINE) 100 MG tablet Take 1 tablet (100 mg total) by mouth 2 (two) times daily.     Allergies:   Bee venom   Social History   Socioeconomic History  . Marital status: Single    Spouse name: Not on file  . Number of  children: Not on file  . Years of education: Not on file  . Highest education level: Not on file  Occupational History  . Not on file  Tobacco Use  . Smoking status: Former Smoker    Quit date: 07/06/1988    Years since quitting: 30.9  . Smokeless tobacco: Never Used  Substance and Sexual Activity  . Alcohol use: Not Currently    Comment: socially  . Drug use: No  . Sexual activity: Not on file  Other Topics Concern  . Not on file  Social History Narrative  . Not on file   Social Determinants of Health   Financial Resource Strain:   . Difficulty of Paying Living Expenses: Not on file  Food Insecurity:   . Worried About Charity fundraiser in the Last Year: Not on file  . Ran Out of Food in the Last Year: Not on file  Transportation Needs:   . Lack of Transportation (Medical): Not on file  . Lack of Transportation (Non-Medical): Not on file  Physical Activity:   . Days of Exercise per Week: Not on file  . Minutes of Exercise per Session: Not on file  Stress:   . Feeling of Stress : Not on file  Social Connections:   . Frequency of Communication with Friends and Family: Not on file  . Frequency of Social Gatherings with Friends and Family: Not on file  . Attends Religious Services: Not on file  . Active Member of Clubs or Organizations: Not on file  . Attends Archivist Meetings: Not on file  . Marital Status: Not on file     Family History:  The patient's   family history includes Cancer in his mother; Heart Problems in his father.   ROS:   Please see the history of present illness.    ROS All other systems reviewed and are negative.   PHYSICAL EXAM:   VS:  BP (!) 162/85   Pulse 71   Temp 98.1 F (36.7 C)   Ht 5\' 10"  (1.778 m)   Wt 210 lb (95.3 kg)   SpO2 98%   BMI 30.13 kg/m   Physical Exam  GEN: Well nourished, well developed, in no acute distress  Neck: no JVD, carotid bruits, or masses Cardiac:RRR; no murmurs, rubs, or gallops  Respiratory:   clear to auscultation bilaterally, normal work of breathing GI: soft, nontender, nondistended, + BS Ext: +1 edema bilaterally mostly in the feet and ankles right greater than left  Good distal pulses bilaterally Neuro:  Alert and Oriented x 3 Psych: euthymic mood, full affect  Wt Readings from Last 3 Encounters:  05/27/19 210 lb (95.3 kg)  05/20/19 217 lb (98.4 kg)  05/03/19 221 lb (100.2 kg)      Studies/Labs Reviewed:   EKG:  EKG is not ordered today.   Recent Labs: 03/21/2019: ALT 20 03/26/2019: Magnesium 2.7 04/23/2019: Hemoglobin  10.1; Platelets 360 05/13/2019: BUN 24; Creat 1.94; Potassium 4.2; Sodium 138   Lipid Panel    Component Value Date/Time   CHOL 152 12/31/2018 0958   TRIG 172 (H) 12/31/2018 0958   HDL 41 12/31/2018 0958   CHOLHDL 3.9 04/26/2018 1011   VLDL 20 04/26/2018 1011   LDLCALC 77 12/31/2018 0958    Additional studies/ records that were reviewed today include:  Lower extremity arterial ABIs 12/8/2020IMPRESSION: 1. Abnormal arterial waveforms at the left ankle despite normal ABI suggesting a degree of arterial occlusive disease at rest. ABIs may be artificially elevated indiabetics due to arterial medial calcification.       ASSESSMENT:    1. Edema, unspecified type   2. Coronary artery disease involving native coronary artery of native heart without angina pectoris   3. Essential hypertension   4. Type 2 diabetes mellitus with diabetic nephropathy, without long-term current use of insulin ([place])   5. Hyperlipidemia, unspecified hyperlipidemia type   6. CRI (chronic renal insufficiency), stage 3 (moderate)   7. S/P CABG (coronary artery bypass graft)      PLAN:  In order of problems listed above:  Edema recent ABIs with abnormal arterial waveform of the left ankle despite normal ABI suggesting a degree of arterial occlusive disease.  Has appointment to see Dr. [name].  Was given a 14-day course of Lasix 40 mg daily from Dr. Kipp Brood.  He  has lost about 14 pounds.  He still has trace of edema.  Will check renal today and continue Lasix unless his kidneys edema otherwise.  Decrease amlodipine to 5 mg once daily to see if this is contributing.  We will update 2D echo to make sure his LV function is normal.  Follow-up with me in 2 to 3 weeks.  CAD status post CABG 03/25/2019 Intra-Op TEE normal LVEF 50 to 55% with mild LVH no angina, scars healing well  Essential hypertension blood pressure running high.  Will increase hydralazine to 100 mg 3 times a day while decreasing amlodipine.  Diabetes mellitus managed by PCP  Hyperlipidemia on Crestor  CKD stage III creatinine 1.94 05/13/2019    Medication Adjustments/Labs and Tests Ordered: Current medicines are reviewed at length with the patient today.  Concerns regarding medicines are outlined above.  Medication changes, Labs and Tests ordered today are listed in the Patient Instructions below. Patient Instructions   Medication Instructions:  Continue lasix 40 mg daily   Decrease AMLODIPINE to 5 mg daily   Increase HYDRALAZINE to 100 mg THREE times daily   Labwork: TODAY  bmet   Testing/Procedures: Your physician has requested that you have an echocardiogram. Echocardiography is a painless test that uses sound waves to create images of your heart. It provides your doctor with information about the size and shape of your heart and how well your heart's chambers and valves are working. This procedure takes approximately one hour. There are no restrictions for this procedure.   Follow-Up: Your physician recommends that you schedule a follow-up appointment in: 2-3 WEEKS    Any Other Special Instructions Will Be Listed Below (If Applicable).   PLEASE KEEP A BLOOD PRESSURE LOG AND BRING IT TO NEXT OFFICE VISIT     If you need a refill on your cardiac medications before your next appointment, please call your pharmacy. Two Gram Sodium Diet 2000 mg  What is Sodium? Sodium  is a mineral found naturally in many foods. The most significant source of sodium in the diet is table  salt, which is about 40% sodium.  Processed, convenience, and preserved foods also contain a large amount of sodium.  The body needs only 500 mg of sodium daily to function,  A normal diet provides more than enough sodium even if you do not use salt.  Why Limit Sodium? A build up of sodium in the body can cause thirst, increased blood pressure, shortness of breath, and water retention.  Decreasing sodium in the diet can reduce edema and risk of heart attack or stroke associated with high blood pressure.  Keep in mind that there are many other factors involved in these health problems.  Heredity, obesity, lack of exercise, cigarette smoking, stress and what you eat all play a role.  General Guidelines:  Do not add salt at the table or in cooking.  One teaspoon of salt contains over 2 grams of sodium.  Read food labels  Avoid processed and convenience foods  Ask your dietitian before eating any foods not dicussed in the menu planning guidelines  Consult your physician if you wish to use a salt substitute or a sodium containing medication such as antacids.  Limit milk and milk products to 16 oz (2 cups) per day.  Shopping Hints:  READ LABELS!! "Dietetic" does not necessarily mean low sodium.  Salt and other sodium ingredients are often added to foods during processing.   Menu Planning Guidelines Food Group Choose More Often Avoid  Beverages (see also the milk group All fruit juices, low-sodium, salt-free vegetables juices, low-sodium carbonated beverages Regular vegetable or tomato juices, commercially softened water used for drinking or cooking  Breads and Cereals Enriched white, wheat, rye and pumpernickel bread, hard rolls and dinner rolls; muffins, cornbread and waffles; most dry cereals, cooked cereal without added salt; unsalted crackers and breadsticks; low sodium or homemade bread  crumbs Bread, rolls and crackers with salted tops; quick breads; instant hot cereals; pancakes; commercial bread stuffing; self-rising flower and biscuit mixes; regular bread crumbs or cracker crumbs  Desserts and Sweets Desserts and sweets mad with mild should be within allowance Instant pudding mixes and cake mixes  Fats Butter or margarine; vegetable oils; unsalted salad dressings, regular salad dressings limited to 1 Tbs; light, sour and heavy cream Regular salad dressings containing bacon fat, bacon bits, and salt pork; snack dips made with instant soup mixes or processed cheese; salted nuts  Fruits Most fresh, frozen and canned fruits Fruits processed with salt or sodium-containing ingredient (some dried fruits are processed with sodium sulfites        Vegetables Fresh, frozen vegetables and low- sodium canned vegetables Regular canned vegetables, sauerkraut, pickled vegetables, and others prepared in brine; frozen vegetables in sauces; vegetables seasoned with ham, bacon or salt pork  Condiments, Sauces, Miscellaneous  Salt substitute with physician's approval; pepper, herbs, spices; vinegar, lemon or lime juice; hot pepper sauce; garlic powder, onion powder, low sodium soy sauce (1 Tbs.); low sodium condiments (ketchup, chili sauce, mustard) in limited amounts (1 tsp.) fresh ground horseradish; unsalted tortilla chips, pretzels, potato chips, popcorn, salsa (1/4 cup) Any seasoning made with salt including garlic salt, celery salt, onion salt, and seasoned salt; sea salt, rock salt, kosher salt; meat tenderizers; monosodium glutamate; mustard, regular soy sauce, barbecue, sauce, chili sauce, teriyaki sauce, steak sauce, Worcestershire sauce, and most flavored vinegars; canned gravy and mixes; regular condiments; salted snack foods, olives, picles, relish, horseradish sauce, catsup   Food preparation: Try these seasonings Meats:    Pork Sage, onion Serve with applesauce  Chicken  Poultry  seasoning, thyme, parsley Serve with cranberry sauce  Lamb Curry powder, rosemary, garlic, thyme Serve with mint sauce or jelly  Veal Marjoram, basil Serve with current jelly, cranberry sauce  Beef Pepper, bay leaf Serve with dry mustard, unsalted chive butter  Fish [place] leaf, dill Serve with unsalted lemon butter, unsalted parsley butter  Vegetables:    Asparagus Lemon juice   Broccoli Lemon juice   Carrots Mustard dressing parsley, mint, nutmeg, glazed with unsalted butter and sugar   Green beans Marjoram, lemon juice, nutmeg,dill seed   Tomatoes Basil, marjoram, onion   Spice /blend for [employer]" 4 tsp ground thyme 1 tsp ground sage 3 tsp ground rosemary 4 tsp ground marjoram   Test your knowledge 1. A product that says "Salt Free" may still contain sodium. True or False 2. Garlic Powder and Hot Pepper Sauce an be used as alternative seasonings.True or False 3. Processed foods have more sodium than fresh foods.  True or False 4. Canned Vegetables have less sodium than froze True or False  WAYS TO DECREASE YOUR SODIUM INTAKE 1. Avoid the use of added salt in cooking and at the table.  Table salt (and other prepared seasonings which contain salt) is probably one of the greatest sources of sodium in the diet.  Unsalted foods can gain flavor from the sweet, sour, and butter taste sensations of herbs and spices.  Instead of using salt for seasoning, try the following seasonings with the foods listed.  Remember: how you use them to enhance natural food flavors is limited only by your creativity... Allspice-Meat, fish, eggs, fruit, peas, red and yellow vegetables Almond Extract-Fruit baked goods Anise Seed-Sweet breads, fruit, carrots, beets, cottage cheese, cookies (tastes like licorice) Basil-Meat, fish, eggs, vegetables, rice, vegetables salads, soups, sauces Bay Leaf-Meat, fish, stews, poultry Burnet-Salad, vegetables (cucumber-like flavor) Caraway Seed-Bread, cookies, cottage cheese,  meat, vegetables, cheese, rice Cardamon-Baked goods, fruit, soups Celery Powder or seed-Salads, salad dressings, sauces, meatloaf, soup, bread.Do not use  celery salt Chervil-Meats, salads, fish, eggs, vegetables, cottage cheese (parsley-like flavor) Chili Power-Meatloaf, chicken cheese, corn, eggplant, egg dishes Chives-Salads cottage cheese, egg dishes, soups, vegetables, sauces Cilantro-Salsa, casseroles Cinnamon-Baked goods, fruit, pork, lamb, chicken, carrots Cloves-Fruit, baked goods, fish, pot roast, green beans, beets, carrots Coriander-Pastry, cookies, meat, salads, cheese (lemon-orange flavor) Cumin-Meatloaf, fish,cheese, eggs, cabbage,fruit pie (caraway flavor) [employer], fruit, eggs, fish, poultry, cottage cheese, vegetables Dill Seed-Meat, cottage cheese, poultry, vegetables, fish, salads, bread Fennel Seed-Bread, cookies, apples, pork, eggs, fish, beets, cabbage, cheese, Licorice-like flavor Garlic-(buds or powder) Salads, meat, poultry, fish, bread, butter, vegetables, potatoes.Do not  use garlic salt Ginger-Fruit, vegetables, baked goods, meat, fish, poultry Horseradish Root-Meet, vegetables, butter Lemon Juice or Extract-Vegetables, fruit, tea, baked goods, fish salads Mace-Baked goods fruit, vegetables, fish, poultry (taste like nutmeg) Maple Extract-Syrups Marjoram-Meat, chicken, fish, vegetables, breads, green salads (taste like Sage) Mint-Tea, lamb, sherbet, vegetables, desserts, carrots, cabbage Mustard, Dry or Seed-Cheese, eggs, meats, vegetables, poultry Nutmeg-Baked goods, fruit, chicken, eggs, vegetables, desserts Onion Powder-Meat, fish, poultry, vegetables, cheese, eggs, bread, rice salads (Do not use   Onion salt) Orange Extract-Desserts, baked goods Oregano-Pasta, eggs, cheese, onions, pork, lamb, fish, chicken, vegetables, green salads Paprika-Meat, fish, poultry, eggs, cheese, vegetables Parsley Flakes-Butter, vegetables, meat fish, poultry, eggs,  bread, salads (certain forms may   Contain sodium Pepper-Meat fish, poultry, vegetables, eggs Peppermint Extract-Desserts, baked goods Poppy Seed-Eggs, bread, cheese, fruit dressings, baked goods, noodles, vegetables, cottage  [employer], poultry, meat, fish, cauliflower, turnips,eggs bread Saffron-Rice, bread,  veal, chicken, fish, eggs Sage-Meat, fish, poultry, onions, eggplant, tomateos, pork, stews Savory-Eggs, salads, poultry, meat, rice, vegetables, soups, pork Tarragon-Meat, poultry, fish, eggs, butter, vegetables (licorice-like flavor)  Thyme-Meat, poultry, fish, eggs, vegetables, (clover-like flavor), sauces, soups Tumeric-Salads, butter, eggs, fish, rice, vegetables (saffron-like flavor) Vanilla Extract-Baked goods, candy Vinegar-Salads, vegetables, meat marinades Walnut Extract-baked goods, candy  2. Choose your Foods Wisely   The following is a list of foods to avoid which are high in sodium:  Meats-Avoid all smoked, canned, salt cured, dried and kosher meat and fish as well as Anchovies   [employer] meats:Bologna, Liverwurst, Pastrami Canned meat or fish  Marinated herring Caviar    Pepperoni Corned Beef   Pizza Dried chipped beef  Salami Frozen breaded fish or meat Salt pork Frankfurters or hot dogs  Sardines Gefilte fish   Sausage Ham (boiled ham, Proscuitto Smoked butt    spiced ham)   Spam      TV Dinners Vegetables Canned vegetables (Regular) Relish Canned mushrooms  Sauerkraut Olives    Tomato juice Pickles  Bakery and Dessert Products Canned puddings  Cream pies Cheesecake   Decorated cakes Cookies  Beverages/Juices Tomato juice, regular  Gatorade   V-8 vegetable juice, regular  Breads and Cereals Biscuit mixes   Salted potato chips, corn chips, pretzels Bread stuffing mixes  Salted crackers and rolls Pancake and waffle mixes Self-rising flour  Seasonings Accent    Meat sauces Barbecue  sauce  Meat tenderizer Catsup    Monosodium glutamate (MSG) Celery salt   Onion salt Chili sauce   Prepared mustard Garlic salt   Salt, seasoned salt, sea salt Gravy mixes   Soy sauce Horseradish   Steak sauce Ketchup   Tartar sauce Lite salt    Teriyaki sauce Marinade mixes   Worcestershire sauce  Others Baking powder   Cocoa and cocoa mixes Baking soda   Commercial casserole mixes Candy-caramels, chocolate  Dehydrated soups    Bars, fudge,nougats  Instant rice and pasta mixes Canned broth or soup  Maraschino cherries Cheese, aged and processed cheese and cheese spreads  Learning Assessment Quiz  Indicated T (for True) or F (for False) for each of the following statements:  1. _____ Fresh fruits and vegetables and unprocessed grains are generally low in sodium 2. _____ Water may contain a considerable amount of sodium, depending on the source 3. _____ You can always tell if a food is high in sodium by tasting it 4. _____ Certain laxatives my be high in sodium and should be avoided unless prescribed   by a physician or pharmacist 5. _____ Salt substitutes may be used freely by anyone on a sodium restricted diet 6. _____ Sodium is present in table salt, food additives and as a natural component of   most foods 7. _____ Table salt is approximately 90% sodium 8. _____ Limiting sodium intake may help prevent excess fluid accumulation in the body 9. _____ On a sodium-restricted diet, seasonings such as bouillon soy sauce, and    cooking wine should be used in place of table salt 10. _____ On an ingredient list, a product which lists monosodium glutamate as the first   ingredient is an appropriate food to include on a low sodium diet  Circle the best answer(s) to the following statements (Hint: there may be more than one correct answer)  11. On a low-sodium diet, some acceptable snack items are:    A. Olives  F. Bean dip   K.  Grapefruit juice    B. Salted Pretzels G. Commercial  Popcorn   L. Canned peaches    C. Carrot Sticks  H. Bouillon   M. Unsalted nuts   D. [place] fries  I. Peanut butter crackers N. Salami   E. Sweet pickles J. Tomato Juice   O. Pizza  12.  Seasonings that may be used freely on a reduced - sodium diet include   A. Lemon wedges F.Monosodium glutamate K. Celery seed    B.Soysauce   G. Pepper   L. Mustard powder   C. Sea salt  H. Cooking wine  M. Onion flakes   D. Vinegar  E. Prepared horseradish N. Salsa   E. Sage   J. Worcestershire sauce  O. Chutney      Signed, Ermalinda Barrios, PA-C  05/27/2019 2:35 PM    [facility] [place], [place], [place]  [address] Phone: [phone]; Fax: [phone]

## 2019-05-27 NOTE — Telephone Encounter (Signed)
-----   Message from Imogene Burn, [place] sent at 05/27/2019  4:03 PM EST ----- Crt bumped up to 2.22 so would stop lasix for now. Can take if weight gain 2-3 lbs overnight.  Hopefully swelling will come down with lowering norvasc and compression hose.No other changes. Thanks.

## 2019-05-27 NOTE — Telephone Encounter (Addendum)
Patient notified.  OV scheduled with Rosaria Ferries in [place] office for Monday, 06/03/19.   Declined virtual visit offered.  Wanted to be seen in person.

## 2019-05-29 ENCOUNTER — Ambulatory Visit (HOSPITAL_COMMUNITY)
Admission: RE | Admit: 2019-05-29 | Discharge: 2019-05-29 | Disposition: A | Discharge status: Home / Self Care | Payer: Medicaid Other | Source: Ambulatory Visit | Attending: Physician Assistant | Admitting: Physician Assistant

## 2019-05-29 ENCOUNTER — Telehealth (INDEPENDENT_AMBULATORY_CARE_PROVIDER_SITE_OTHER): Payer: Self-pay | Admitting: *Deleted

## 2019-05-29 ENCOUNTER — Other Ambulatory Visit: Payer: Self-pay

## 2019-05-29 ENCOUNTER — Ambulatory Visit (INDEPENDENT_AMBULATORY_CARE_PROVIDER_SITE_OTHER): Payer: Self-pay

## 2019-05-29 DIAGNOSIS — I10 Essential (primary) hypertension: Secondary | ICD-10-CM

## 2019-05-29 DIAGNOSIS — I2510 Atherosclerotic heart disease of native coronary artery without angina pectoris: Secondary | ICD-10-CM

## 2019-05-29 NOTE — Progress Notes (Signed)
*  PRELIMINARY RESULTS* Echocardiogram 2D Echocardiogram has been performed.  [name] 05/29/2019, 12:38 PM

## 2019-05-29 NOTE — Telephone Encounter (Signed)
Referring MD/PCP: scott luking   Procedure: tcs  Reason/Indication:  Screening, fam hx colon ca  Has patient had this procedure before?  Yes, 2010  If so, when, by whom and where?    Is there a family history of colon cancer?  Yes, mother  Who?  What age when diagnosed?    Is patient diabetic?   yes      Does patient have prosthetic heart valve or mechanical valve?  no  Do you have a pacemaker/defibrillator?  no  Has patient ever had endocarditis/atrial fibrillation? no  Does patient use oxygen? no  Has patient had joint replacement within last 12 months?  no  Is patient constipated or do they take laxatives? no  Does patient have a history of alcohol/drug use?  no  Is patient on blood thinner such as Coumadin, Plavix and/or Aspirin? yes  Medications: asa 81 mg daily, hydralazine 25 mg daily, lisinopril 10 mg daily, glipizide 5 mg daily, carvedilol 12.5 mg daily, furosemide 40 mg daily, rosuvastatin 5 mg daily  Allergies: bee stings  Medication Adjustment per Dr Laural Golden: asa 2 days  Procedure date & time: 06/27/19 at 955

## 2019-05-30 ENCOUNTER — Other Ambulatory Visit: Payer: Self-pay

## 2019-05-30 ENCOUNTER — Ambulatory Visit (INDEPENDENT_AMBULATORY_CARE_PROVIDER_SITE_OTHER): Payer: Medicaid Other | Admitting: "Endocrinology

## 2019-05-30 ENCOUNTER — Encounter: Payer: Self-pay | Admitting: "Endocrinology

## 2019-05-30 VITALS — BP 155/81 | HR 76 | Ht 70.0 in | Wt 216.0 lb

## 2019-05-30 DIAGNOSIS — E1159 Type 2 diabetes mellitus with other circulatory complications: Secondary | ICD-10-CM

## 2019-05-30 MED ORDER — BD PEN NEEDLE SHORT U/F 31G X 8 MM MISC: 1.0000 | 3 refills | Status: DC

## 2019-05-30 MED ORDER — LANTUS SOLOSTAR 100 UNIT/ML ~~LOC~~ SOPN: 20.0000 [IU] | PEN_INJECTOR | Freq: Every day | SUBCUTANEOUS | 2 refills | Status: DC

## 2019-05-30 MED ORDER — GLIPIZIDE 5 MG PO TABS: 5.0000 mg | ORAL_TABLET | Freq: Two times a day (BID) | ORAL | 2 refills | Status: DC

## 2019-05-30 NOTE — Patient Instructions (Signed)

## 2019-05-30 NOTE — Progress Notes (Signed)
05/30/2019, 5:51 PM  Endocrinology follow-up note   Subjective:    Patient ID: [name], male    DOB: 11-Aug-1955.  [name] is being seen in follow-up after he was seen in consultation for management of currently uncontrolled symptomatic diabetes requested by  [name], MD.   Past Medical History:  Diagnosis Date  . Diabetes mellitus without complication ([place])   . Hypertension   . Type 2 diabetes mellitus with diabetic nephropathy ([place]) 04/23/2019    Past Surgical History:  Procedure Laterality Date  . CORONARY ARTERY BYPASS GRAFT N/A 03/25/2019   Procedure: CORONARY ARTERY BYPASS GRAFTING (CABG) X  4 USING LEFT INTERNAL MAMMARY ARTERY AND RIGHT SAPHENOUS VEIN GRAFTS;  Surgeon: [name], MD;  Location: [place];  Service: Open Heart Surgery;  Laterality: N/A;  . EXCISION MORTON'S NEUROMA    . RIGHT/LEFT HEART CATH AND CORONARY ANGIOGRAPHY N/A 03/12/2019   Procedure: RIGHT/LEFT HEART CATH AND CORONARY ANGIOGRAPHY;  Surgeon: [name], MD;  Location: [place] [facility] [facility];  Service: Cardiovascular;  Laterality: N/A;  . TEE WITHOUT CARDIOVERSION N/A 03/25/2019   Procedure: TRANSESOPHAGEAL ECHOCARDIOGRAM (TEE);  Surgeon: [name], MD;  Location: [place];  Service: Open Heart Surgery;  Laterality: N/A;    Social History   Socioeconomic History  . Marital status: Single    Spouse name: Not on file  . Number of children: Not on file  . Years of education: Not on file  . Highest education level: Not on file  Occupational History  . Not on file  Tobacco Use  . Smoking status: Former Smoker    Quit date: 07/06/1988    Years since quitting: 30.9  . Smokeless tobacco: Never Used  Substance and Sexual Activity  . Alcohol use: Not Currently    Comment: socially  . Drug use: No  . Sexual activity: Not on file  Other Topics Concern  . Not on file  Social  History Narrative  . Not on file   Social Determinants of Health   Financial Resource Strain:   . Difficulty of Paying Living Expenses: Not on file  Food Insecurity:   . Worried About Charity fundraiser in the Last Year: Not on file  . Ran Out of Food in the Last Year: Not on file  Transportation Needs:   . Lack of Transportation (Medical): Not on file  . Lack of Transportation (Non-Medical): Not on file  Physical Activity:   . Days of Exercise per Week: Not on file  . Minutes of Exercise per Session: Not on file  Stress:   . Feeling of Stress : Not on file  Social Connections:   . Frequency of Communication with Friends and Family: Not on file  . Frequency of Social Gatherings with Friends and Family: Not on file  . Attends Religious Services: Not on file  . Active Member of Clubs or Organizations: Not on file  . Attends Archivist Meetings: Not on file  . Marital Status: Not on file    Family History  Problem Relation Age of Onset  . Cancer Mother   . Heart Problems Father  Outpatient Encounter Medications as of 05/30/2019  Medication Sig  . acetaminophen (TYLENOL) 500 MG tablet Take 1,000 mg by mouth every 6 (six) hours as needed for moderate pain or headache.  Marland Kitchen amLODipine (NORVASC) 5 MG tablet Take 1 tablet (5 mg total) by mouth daily.  Marland Kitchen aspirin 325 MG EC tablet Take 1 tablet (325 mg total) by mouth daily.  . carvedilol (COREG) 25 MG tablet Take 1 tablet (25 mg total) by mouth 2 (two) times daily with a meal.  . Cyanocobalamin (B-12) 2500 MCG TABS Take 2,500 mcg by mouth daily.  . diphenhydramine-acetaminophen (TYLENOL PM) 25-500 MG TABS tablet Take 1 tablet by mouth at bedtime as needed (sleep).  . furosemide (LASIX) 40 MG tablet Only take 40 mg daily if you gain more than 2-3 pounds overnight (Patient not taking: Reported on 05/30/2019)  . glipiZIDE (GLUCOTROL) 5 MG tablet Take 1 tablet (5 mg total) by mouth 2 (two) times daily before a meal.  .  hydrALAZINE (APRESOLINE) 100 MG tablet Take 1 tablet (100 mg total) by mouth 3 (three) times daily.  . Insulin Glargine (LANTUS SOLOSTAR) 100 UNIT/ML Solostar Pen Inject 20 Units into the skin daily.  . Insulin Pen Needle (B-D ULTRAFINE III SHORT PEN) 31G X 8 MM MISC 1 each by Does not apply route as directed.  . Multiple Vitamin (MULTIVITAMIN WITH MINERALS) TABS tablet Take 1 tablet by mouth daily.  . rosuvastatin (CRESTOR) 10 MG tablet Take 1 tablet (10 mg total) by mouth daily.  . [DISCONTINUED] glipiZIDE (GLUCOTROL) 5 MG tablet Take 2 tablets (10 mg total) by mouth 2 (two) times daily before a meal.   No facility-administered encounter medications on file as of 05/30/2019.    ALLERGIES: Allergies  Allergen Reactions  . Bee Venom Anaphylaxis    VACCINATION STATUS: Immunization History  Administered Date(s) Administered  . Pneumococcal Polysaccharide-23 07/16/2018  . Zoster Recombinat (Shingrix) 04/25/2019    Diabetes He presents for his follow-up diabetic visit. He has type 2 diabetes mellitus. Onset time: He was diagnosed at approximate age of 32 years. His disease course has been worsening. There are no hypoglycemic associated symptoms. Pertinent negatives for hypoglycemia include no confusion, headaches, pallor or seizures. Associated symptoms include polydipsia and polyuria. Pertinent negatives for diabetes include no chest pain, no fatigue, no polyphagia and no weakness. There are no hypoglycemic complications. Symptoms are worsening. Diabetic complications include heart disease and nephropathy. (Patient is a status post quadruple bypass surgery in October 2019.) Risk factors for coronary artery disease include dyslipidemia, diabetes mellitus, family history, obesity, hypertension, male sex, tobacco exposure and sedentary lifestyle. Current diabetic treatments: Is currently on glipizide 10 mg p.o. twice daily. His weight is fluctuating minimally. He is following a generally unhealthy  diet. When asked about meal planning, he reported none. He has had a previous visit with a dietitian. He participates in exercise intermittently. His home blood glucose trend is increasing steadily. His breakfast blood glucose range is generally 140-180 mg/dl. His lunch blood glucose range is generally 180-200 mg/dl. His dinner blood glucose range is generally 180-200 mg/dl. His bedtime blood glucose range is generally >200 mg/dl. His overall blood glucose range is 180-200 mg/dl. (He brings in a log showing significantly above target glycemic profile both fasting and postprandial.  His most recent A1c was 8.2% in October 2020.) An ACE inhibitor/angiotensin II receptor blocker is being taken. Eye exam is current.  Hyperlipidemia This is a chronic problem. The current episode started more than 1 year ago. The  problem is controlled. Exacerbating diseases include chronic renal disease, diabetes and obesity. Pertinent negatives include no chest pain, myalgias or shortness of breath. Current antihyperlipidemic treatment includes statins. Risk factors for coronary artery disease include family history, dyslipidemia, diabetes mellitus, hypertension, male sex, obesity and a sedentary lifestyle.  Hypertension This is a chronic problem. The current episode started more than 1 year ago. The problem is controlled. Pertinent negatives include no chest pain, headaches, neck pain, palpitations or shortness of breath. Risk factors for coronary artery disease include diabetes mellitus, dyslipidemia and male gender. Past treatments include angiotensin blockers. Hypertensive end-organ damage includes kidney disease and CAD/MI. Identifiable causes of hypertension include chronic renal disease.     Review of Systems  Constitutional: Negative for chills, fatigue, fever and unexpected weight change.  HENT: Negative for dental problem, mouth sores and trouble swallowing.   Eyes: Negative for visual disturbance.  Respiratory:  Negative for cough, choking, chest tightness, shortness of breath and wheezing.   Cardiovascular: Negative for chest pain, palpitations and leg swelling.  Gastrointestinal: Negative for abdominal distention, abdominal pain, constipation, diarrhea, nausea and vomiting.  Endocrine: Positive for polydipsia and polyuria. Negative for polyphagia.  Genitourinary: Negative for dysuria, flank pain, hematuria and urgency.  Musculoskeletal: Negative for back pain, gait problem, myalgias and neck pain.  Skin: Negative for pallor, rash and wound.  Neurological: Negative for seizures, syncope, weakness, numbness and headaches.  Psychiatric/Behavioral: Negative for confusion and dysphoric mood.    Objective:    BP (!) 155/81   Pulse 76   Ht 5\' 10"  (1.778 m)   Wt 216 lb (98 kg)   BMI 30.99 kg/m   Wt Readings from Last 3 Encounters:  05/30/19 216 lb (98 kg)  05/27/19 210 lb (95.3 kg)  05/20/19 217 lb (98.4 kg)     Physical Exam- Limited  Constitutional:  Body mass index is 30.99 kg/m. , not in acute distress, normal state of mind Eyes:  EOMI, no exophthalmos Neck: Supple Respiratory: Adequate breathing efforts Musculoskeletal: no gross deformities, strength intact in all four extremities, no gross restriction of joint movements Skin:  no rashes, no hyperemia Neurological: no tremor with outstretched hands.   CMP ( most recent) CMP     Component Value Date/Time   NA 130 (L) 05/27/2019 1445   NA 139 04/23/2019 1358   K 3.9 05/27/2019 1445   CL 98 05/27/2019 1445   CO2 23 05/27/2019 1445   GLUCOSE 218 (H) 05/27/2019 1445   BUN 37 (H) 05/27/2019 1445   BUN 23 04/23/2019 1358   CREATININE 2.22 (H) 05/27/2019 1445   CREATININE 1.94 (H) 05/13/2019 1029   CALCIUM 9.0 05/27/2019 1445   PROT 6.7 03/21/2019 1526   ALBUMIN 3.9 03/21/2019 1526   AST 19 03/21/2019 1526   ALT 20 03/21/2019 1526   ALKPHOS 56 03/21/2019 1526   BILITOT 0.4 03/21/2019 1526   GFRNONAA 31 (L) 05/27/2019 1445    GFRAA 35 (L) 05/27/2019 1445     Diabetic Labs (most recent): Lab Results  Component Value Date   HGBA1C 8.2 (H) 03/25/2019   HGBA1C 6.8 (A) 03/18/2019   HGBA1C 8.1 (A) 11/13/2018     Lipid Panel ( most recent) Lipid Panel     Component Value Date/Time   CHOL 152 12/31/2018 0958   TRIG 172 (H) 12/31/2018 0958   HDL 41 12/31/2018 0958   CHOLHDL 3.9 04/26/2018 1011   VLDL 20 04/26/2018 1011   LDLCALC 77 12/31/2018 0958   LABVLDL 34 12/31/2018 0958  Lab Results  Component Value Date   TSH 2.110 04/29/2018      Assessment & Plan:   1. DM type 2 causing vascular disease ([place])  - [name] has currently uncontrolled symptomatic type 2 DM since  63 years of age,  with most recent A1c of 8.2 %. Recent labs reviewed.  He presents with log showing significantly above target glycemic profile. - I had a long discussion with him about the progressive nature of diabetes and the pathology behind its complications. -his diabetes is complicated by coronary artery disease, CKD and he remains at a high risk for more acute and chronic complications which include CAD, CVA, CKD, retinopathy, and neuropathy. These are all discussed in detail with him.  - I have counseled him on diet  and weight management  by adopting a carbohydrate restricted/protein rich diet. Patient is encouraged to switch to  unprocessed or minimally processed     complex starch and increased protein intake (animal or plant source), fruits, and vegetables. -  he is advised to stick to a routine mealtimes to eat 3 meals  a day and avoid unnecessary snacks ( to snack only to correct hypoglycemia).   - he  admits there is a room for improvement in his diet and drink choices. -  Suggestion is made for him to avoid simple carbohydrates  from his diet including Cakes, Sweet Desserts / Pastries, Ice Cream, Soda (diet and regular), Sweet Tea, Candies, Chips, Cookies, Sweet Pastries,  Store Bought Juices, Alcohol in  Excess of  1-2 drinks a day, Artificial Sweeteners, Coffee Creamer, and "Sugar-free" Products. This will help patient to have stable blood glucose profile and potentially avoid unintended weight gain.   - he will be scheduled with Jearld Fenton, RDN, CDE for diabetes education.  - I have approached him with the following individualized plan to manage  his diabetes and patient agrees:   -Based on his presentation with above target glycemic profile was fasting and postprandial and due to the fact that he already has complications including coronary artery disease and CKD, he will benefit from initiation of insulin treatment   -Accordingly, he is approached for basal insulin and he accepts. -I discussed and initiated Lantus 20 units nightly, associated with monitoring of blood glucose twice a day-daily before breakfast and at bedtime. -He will have a phone visit in 10 days to review his response.  - he is encouraged to call clinic for blood glucose levels less than 70 or above 200 mg /dl. - he is advised to continue glipizide  5 mg p.o. twice daily with breakfast and supper.  - he is not a candidate for Metformin, SGLT2 inhibitors due to concurrent renal insufficiency.  - he will be considered for incretin therapy as appropriate next visit.  - Specific targets for  A1c;  LDL, HDL, Triglycerides, and  Waist Circumference were discussed with the patient.  2) Blood Pressure /Hypertension:  his blood pressure is  uncontrolled to target.  He has enough prescription for blood pressure control.  he is advised to continue his current medications including hydralazine 100 mg p.o. twice daily, losartan, amlodipine 10 mg p.o. daily, carvedilol 25 mg p.o. twice daily.  3) Lipids/Hyperlipidemia:   Review of his recent lipid panel showed  controlled  LDL at 77 .  he  is advised to continue rosuvastatin 10 mg p.o. nightly.  Side effects and precautions discussed with him.  4)  Weight/Diet:  Body mass  index is 30.99  kg/m.  -   clearly complicating his diabetes care.   he is  a candidate for weight loss. I discussed with him the fact that loss of 5 - 10% of his  current body weight will have the most impact on his diabetes management.  Exercise, and detailed carbohydrates information provided  -  detailed on discharge instructions.  5) Chronic Care/Health Maintenance:  -he  is on ACEI/ARB and Statin medications and  is encouraged to initiate and continue to follow up with Ophthalmology, Dentist,  Podiatrist at least yearly or according to recommendations, and advised to  stay away from smoking. I have recommended yearly flu vaccine and pneumonia vaccine at least every 5 years; moderate intensity exercise for up to 150 minutes weekly; and  sleep for at least 7 hours a day.  - he is  advised to maintain close follow up with [name], MD for primary care needs, as well as his other providers for optimal and coordinated care.  - Patient Care Time Today:  25 min, of which >50% was spent in  counseling and the rest reviewing his  current and  previous labs/studies, previous treatments, his blood glucose readings, and medications' doses and developing a plan for long-term care based on the latest recommendations for standards of care.   [name] participated in the discussions, expressed understanding, and voiced agreement with the above plans.  All questions were answered to his satisfaction. he is encouraged to contact clinic should he have any questions or concerns prior to his return visit.   Follow up plan: - Return in about 10 days (around 06/09/2019), or Phone, for Follow up with Meter and Logs Only - no Labs.  Glade Lloyd, MD [facility] [address] [place], [place] [address] Phone: [phone]  Fax: [phone]    05/30/2019, 5:51 PM  This note was partially dictated with voice recognition software. Similar sounding  words can be transcribed inadequately or may not  be corrected upon review.

## 2019-05-31 ENCOUNTER — Ambulatory Visit: Payer: Medicaid Other | Admitting: Cardiovascular Disease

## 2019-05-31 ENCOUNTER — Encounter: Payer: Self-pay | Admitting: Cardiovascular Disease

## 2019-05-31 ENCOUNTER — Telehealth: Payer: Self-pay | Admitting: *Deleted

## 2019-05-31 ENCOUNTER — Telehealth (HOSPITAL_COMMUNITY): Payer: Self-pay | Admitting: *Deleted

## 2019-05-31 ENCOUNTER — Encounter: Payer: Self-pay | Admitting: *Deleted

## 2019-05-31 DIAGNOSIS — I739 Peripheral vascular disease, unspecified: Secondary | ICD-10-CM | POA: Diagnosis not present

## 2019-05-31 DIAGNOSIS — R931 Abnormal findings on diagnostic imaging of heart and coronary circulation: Secondary | ICD-10-CM

## 2019-05-31 NOTE — Progress Notes (Signed)
05/31/2019 [name]   [birth year]  [id number]  Primary Physician [name], MD Primary Cardiologist: Lorretta Harp MD [name], [place], [place]  HPI:  [name] is a 63 y.o. 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  He was referred by [name] for peripheral vascular valuation because of abnormal Doppler studies.  He does have a history of treated hypertension, diabetes and hyperlipidemia.  He had CABG by Dr. Kipp Brood after cardiac cath by Dr. Irish Lack 05/23/2019 with a LIMA to his LAD, vein graft to the PLV, OM 2 and diagonal 1.  Normal LV function.  He was complaining of some lower extremity edema which improved with oral diuretics.  Doppler studies performed at [facility] 05/21/2019 revealed normal ABIs with monophasic left ankle waveforms of the patient specifically denies claudication.   Current Meds  Medication Sig  . acetaminophen (TYLENOL) 500 MG tablet Take 1,000 mg by mouth every 6 (six) hours as needed for moderate pain or headache.  Marland Kitchen amLODipine (NORVASC) 5 MG tablet Take 1 tablet (5 mg total) by mouth daily.  Marland Kitchen aspirin 325 MG EC tablet Take 1 tablet (325 mg total) by mouth daily.  . carvedilol (COREG) 25 MG tablet Take 1 tablet (25 mg total) by mouth 2 (two) times daily with a meal.  . Cyanocobalamin (B-12) 2500 MCG TABS Take 2,500 mcg by mouth daily.  . diphenhydramine-acetaminophen (TYLENOL PM) 25-500 MG TABS tablet Take 1 tablet by mouth at bedtime as needed (sleep).  . furosemide (LASIX) 40 MG tablet Only take 40 mg daily if you gain more than 2-3 pounds overnight  . glipiZIDE (GLUCOTROL) 5 MG tablet Take 1 tablet (5 mg total) by mouth 2 (two) times daily before a meal.  . hydrALAZINE (APRESOLINE) 100 MG tablet Take 1 tablet (100 mg total) by mouth 3 (three) times daily.  . Insulin Glargine (LANTUS SOLOSTAR) 100 UNIT/ML Solostar Pen Inject 20 Units into the skin daily.    . Insulin Pen Needle (B-D ULTRAFINE III SHORT PEN) 31G X 8 MM MISC 1 each by Does not apply route as directed.  . Multiple Vitamin (MULTIVITAMIN WITH MINERALS) TABS tablet Take 1 tablet by mouth daily.  . rosuvastatin (CRESTOR) 10 MG tablet Take 1 tablet (10 mg total) by mouth daily.     Allergies  Allergen Reactions  . Bee Venom Anaphylaxis    Social History   Socioeconomic History  . Marital status: Single    Spouse name: Not on file  . Number of children: Not on file  . Years of education: Not on file  . Highest education level: Not on file  Occupational History  . Not on file  Tobacco Use  . Smoking status: Former Smoker    Quit date: 07/06/1988    Years since quitting: 30.9  . Smokeless tobacco: Never Used  Substance and Sexual Activity  . Alcohol use: Not Currently    Comment: socially  . Drug use: No  . Sexual activity: Not on file  Other Topics Concern  . Not on file  Social History Narrative  . Not on file   Social Determinants of Health   Financial Resource Strain:   . Difficulty of Paying Living Expenses: Not on file  Food Insecurity:   . Worried About Charity fundraiser in the Last Year: Not on file  . Ran Out of Food in the Last Year: Not on file  Transportation Needs:   . Film/video editor (Medical): Not on file  . Lack of Transportation (Non-Medical): Not on file  Physical Activity:   . Days of Exercise per Week: Not on file  . Minutes of Exercise per Session: Not on file  Stress:   . Feeling of Stress : Not on file  Social Connections:   . Frequency of Communication with Friends and Family: Not on file  . Frequency of Social Gatherings with Friends and Family: Not on file  . Attends Religious Services: Not on file  . Active Member of Clubs or Organizations: Not on file  . Attends Archivist Meetings: Not on file  . Marital Status: Not on file  Intimate Partner Violence:   . Fear of Current or Ex-Partner: Not on file  .  Emotionally Abused: Not on file  . Physically Abused: Not on file  . Sexually Abused: Not on file     Review of Systems: General: negative for chills, fever, night sweats or weight changes.  Cardiovascular: negative for chest pain, dyspnea on exertion, edema, orthopnea, palpitations, paroxysmal nocturnal dyspnea or shortness of breath Dermatological: negative for rash Respiratory: negative for cough or wheezing Urologic: negative for hematuria Abdominal: negative for nausea, vomiting, diarrhea, bright red blood per rectum, melena, or hematemesis Neurologic: negative for visual changes, syncope, or dizziness All other systems reviewed and are otherwise negative except as noted above.    Blood pressure 134/74, pulse 80, temperature 97.8 F (36.6 C), height 5\' 10"  (1.778 m), weight 212 lb 10.1 oz (96.4 kg), SpO2 97 %.  General appearance: alert and no distress Neck: no adenopathy, no carotid bruit, no JVD, supple, symmetrical, trachea midline and thyroid not enlarged, symmetric, no tenderness/mass/nodules Lungs: clear to auscultation bilaterally Heart: regular rate and rhythm, S1, S2 normal, no murmur, click, rub or gallop Extremities: extremities normal, atraumatic, no cyanosis or edema Pulses: 2+ and symmetric Skin: Skin color, texture, turgor normal. No rashes or lesions Neurologic: Alert and oriented X 3, normal strength and tone. Normal symmetric reflexes. Normal coordination and gait  EKG not performed today  ASSESSMENT AND PLAN:   Peripheral arterial disease [facility]) [name] was referred to me by Dr. Walker Shadow for evaluation of PAD.  He is approximately 2 months post CABG.  He was complaining of some swelling in his legs which resolved with oral Lasix.  He had Doppler studies performed at [facility] that showed normal ABIs with monophasic waveforms at the left ankle although the patient is completely asymptomatic.  Specifically denies claudication.  I do not think  his swelling was related to any arterial issues, most likely related to venous harvesting of his bypass grafts.  No further work-up is required at this time      Lorretta Harp MD [facility], [facility] 05/31/2019 3:01 PM

## 2019-05-31 NOTE — Telephone Encounter (Signed)
Called patient to find out his interest in the Cardiac Rehab program. Last time we talked on 05/19/19 he wanted to talk to his doctor about his leg swelling and that he would call us back. Will call again  Russella Dar, Manager

## 2019-05-31 NOTE — Telephone Encounter (Signed)
-----   Message from Imogene Burn, PA-C sent at 05/31/2019  9:10 AM EST ----- Echo reviewed with Dr.Koneswaran. normal heart function but some areas of the heart not moving as well as they should. Please schedule a lexiscan myoview to rule out ischemia. thanks

## 2019-05-31 NOTE — Patient Instructions (Signed)
Medication Instructions:  Your physician recommends that you continue on your current medications as directed. Please refer to the Current Medication list given to you today.  *If you need a refill on your cardiac medications before your next appointment, please call your pharmacy*  Lab Work: NONE ordered at this time of appointment   If you have labs (blood work) drawn today and your tests are completely normal, you will receive your results only by: Marland Kitchen MyChart Message (if you have MyChart) OR . A paper copy in the mail If you have any lab test that is abnormal or we need to change your treatment, we will call you to review the results.  Testing/Procedures: NONE ordered at this time of appointment   Follow-Up: At [facility], you and your health needs are our priority.  As part of our continuing mission to provide you with exceptional heart care, we have created designated Provider Care Teams.  These Care Teams include your primary Cardiologist (physician) and Advanced Practice Providers (APPs -  Physician Assistants and Nurse Practitioners) who all work together to provide you with the care you need, when you need it.  Your next appointment:    AS NEEDED  The format for your next appointment:   In Person  Provider:   Quay Burow, MD  Other Instructions

## 2019-05-31 NOTE — Telephone Encounter (Signed)
Pt.notified

## 2019-05-31 NOTE — Assessment & Plan Note (Signed)
[name] was referred to me by Dr. Walker Shadow for evaluation of PAD.  He is approximately 2 months post CABG.  He was complaining of some swelling in his legs which resolved with oral Lasix.  He had Doppler studies performed at [facility] that showed normal ABIs with monophasic waveforms at the left ankle although the patient is completely asymptomatic.  Specifically denies claudication.  I do not think his swelling was related to any arterial issues, most likely related to venous harvesting of his bypass grafts.  No further work-up is required at this time

## 2019-06-02 NOTE — Telephone Encounter (Signed)
Okay to schedule colonoscopy with conscious sedation 

## 2019-06-03 ENCOUNTER — Ambulatory Visit (INDEPENDENT_AMBULATORY_CARE_PROVIDER_SITE_OTHER): Payer: Medicaid Other | Admitting: Physician Assistant

## 2019-06-03 ENCOUNTER — Other Ambulatory Visit: Payer: Self-pay

## 2019-06-03 ENCOUNTER — Encounter: Payer: Self-pay | Admitting: Physician Assistant

## 2019-06-03 VITALS — BP 163/84 | HR 74 | Temp 97.8°F | Ht 70.0 in | Wt 218.0 lb

## 2019-06-03 DIAGNOSIS — R931 Abnormal findings on diagnostic imaging of heart and coronary circulation: Secondary | ICD-10-CM

## 2019-06-03 DIAGNOSIS — I2510 Atherosclerotic heart disease of native coronary artery without angina pectoris: Secondary | ICD-10-CM

## 2019-06-03 DIAGNOSIS — I10 Essential (primary) hypertension: Secondary | ICD-10-CM

## 2019-06-03 DIAGNOSIS — N183 Chronic kidney disease, stage 3 unspecified: Secondary | ICD-10-CM | POA: Diagnosis not present

## 2019-06-03 DIAGNOSIS — I5042 Chronic combined systolic (congestive) and diastolic (congestive) heart failure: Secondary | ICD-10-CM | POA: Diagnosis not present

## 2019-06-03 MED ORDER — FUROSEMIDE 40 MG PO TABS: 40.0000 mg | ORAL_TABLET | Freq: Every day | ORAL | 11 refills | Status: DC | PRN

## 2019-06-03 NOTE — Progress Notes (Signed)
Cardiology Office Note   Date:  06/03/2019   ID:  [name], DOB 12-Apr-1956, MRN [id number]  PCP:  [name], MD Cardiologist:  [name], MD 04/05/2019 Electrphysiologist: None Rosaria Ferries, PA-C    History of Present Illness: [name] is a 63 y.o. male with a history of DM, HTN, S-CHF, ICM, PAD, CABG 03/25/2019 w/ LIMA-LAD, SVG-PLV-Diag1-OM2  12/18, Dr [name] appt for PAD, nl ABIs w/ monophasic waveforms>>no sx, no further eval  [name] presents for cardiology follow up  He feels he is recovering well from the surgery.  No fevers or chills. No problems w/ drainage or wounds.  Still w/ some LE edema, more on R where vein was taken. It almost resolves during the night, accumulates during the day.   Is no longer taking Lasix, was told to stop because of worsening renal function. Since the Lasix was stopped, he has gained some weight that is likely fluid.   In general, he feels much better since the surgery. Has more energy. DOE is improved.   BP generally runs high. He is compliant w/ meds.    Past Medical History:  Diagnosis Date  . CAD (coronary artery disease) 03/03/2019  . Hypertension   . Type 2 diabetes mellitus with diabetic nephropathy ([place]) 04/23/2019    Past Surgical History:  Procedure Laterality Date  . CORONARY ARTERY BYPASS GRAFT N/A 03/25/2019   Procedure: CORONARY ARTERY BYPASS GRAFTING (CABG) X  4 USING LEFT INTERNAL MAMMARY ARTERY AND RIGHT SAPHENOUS VEIN GRAFTS;  Surgeon: [name], MD;  Location: [place];  Service: Open Heart Surgery;  Laterality: N/A;  . EXCISION MORTON'S NEUROMA    . RIGHT/LEFT HEART CATH AND CORONARY ANGIOGRAPHY N/A 03/12/2019   Procedure: RIGHT/LEFT HEART CATH AND CORONARY ANGIOGRAPHY;  Surgeon: [name], MD;  Location: [place] [facility];  Service: Cardiovascular;  Laterality: N/A;  . TEE WITHOUT CARDIOVERSION N/A 03/25/2019   Procedure: TRANSESOPHAGEAL  ECHOCARDIOGRAM (TEE);  Surgeon: [name], MD;  Location: [place];  Service: Open Heart Surgery;  Laterality: N/A;    Current Outpatient Medications  Medication Sig Dispense Refill  . acetaminophen (TYLENOL) 500 MG tablet Take 1,000 mg by mouth every 6 (six) hours as needed for moderate pain or headache.    Marland Kitchen amLODipine (NORVASC) 5 MG tablet Take 1 tablet (5 mg total) by mouth daily. 90 tablet 3  . aspirin 325 MG EC tablet Take 1 tablet (325 mg total) by mouth daily. 30 tablet 0  . carvedilol (COREG) 25 MG tablet Take 1 tablet (25 mg total) by mouth 2 (two) times daily with a meal. 60 tablet 1  . Cyanocobalamin (B-12) 2500 MCG TABS Take 2,500 mcg by mouth daily.    . diphenhydramine-acetaminophen (TYLENOL PM) 25-500 MG TABS tablet Take 1 tablet by mouth at bedtime as needed (sleep).    . furosemide (LASIX) 40 MG tablet Only take 40 mg daily if you gain more than 2-3 pounds overnight 90 tablet 3  . glipiZIDE (GLUCOTROL) 5 MG tablet Take 1 tablet (5 mg total) by mouth 2 (two) times daily before a meal. 60 tablet 2  . hydrALAZINE (APRESOLINE) 100 MG tablet Take 1 tablet (100 mg total) by mouth 3 (three) times daily. 270 tablet 3  . Insulin Glargine (LANTUS SOLOSTAR) 100 UNIT/ML Solostar Pen Inject 20 Units into the skin daily. 5 pen 2  . Insulin Pen Needle (B-D ULTRAFINE III SHORT PEN) 31G X 8 MM MISC 1  each by Does not apply route as directed. 100 each 3  . Multiple Vitamin (MULTIVITAMIN WITH MINERALS) TABS tablet Take 1 tablet by mouth daily.    . rosuvastatin (CRESTOR) 10 MG tablet Take 1 tablet (10 mg total) by mouth daily. 90 tablet 1   No current facility-administered medications for this visit.    Allergies:   Bee venom    Social History:  The patient  reports that he quit smoking about 30 years ago. He has never used smokeless tobacco. He reports previous alcohol use. He reports that he does not use drugs.   Family History:  The patient's family history includes Cancer in his  mother; Heart Problems in his father.  He indicated that his mother is deceased. He indicated that his father is deceased. He indicated that his sister is alive. He indicated that his brother is alive.   ROS:  Please see the history of present illness. All other systems are reviewed and negative.    PHYSICAL EXAM: VS:  BP (!) 163/84   Pulse 74   Temp 97.8 F (36.6 C)   Ht 5\' 10"  (1.778 m)   Wt 98.9 kg   SpO2 98%   BMI 31.28 kg/m  , BMI Body mass index is 31.28 kg/m. GEN: Well nourished, well developed, male in no acute distress HEENT: normal for age  Neck: minimal JVD, no carotid bruit, no masses Cardiac: RRR; no murmur, no rubs, or gallops Respiratory:  clear to auscultation bilaterally, normal work of breathing GI: soft, nontender, nondistended, + BS MS: no deformity or atrophy; 1+R, trace L LE edema; distal pulses are 2+ in all 4 extremities  Skin: warm and dry, no rash Neuro:  Strength and sensation are intact Psych: euthymic mood, full affect   EKG:  EKG is not ordered today.   ECHO: 05/29/2019  1. Left ventricular ejection fraction, by visual estimation, is 50 to 55%. The left ventricle has low normal function. There is mildly increased left ventricular hypertrophy.  2. Basal anterolateral segment, basal inferior segment, and basal inferolateral segment are abnormal.  3. The left ventricle demonstrates regional wall motion abnormalities.  4. Global right ventricle has mildly reduced systolic function.The right ventricular size is normal. No increase in right ventricular wall thickness.  5. Left atrial size was moderately dilated.  6. Right atrial size was normal.  7. Moderate mitral annular calcification.  8. The mitral valve is grossly normal. Trivial mitral valve regurgitation.  9. The tricuspid valve is grossly normal. Tricuspid valve regurgitation is trivial. 10. The aortic valve is tricuspid. Aortic valve regurgitation is not visualized. 11. The pulmonic valve  was grossly normal. Pulmonic valve regurgitation is trivial. 12. Normal pulmonary artery systolic pressure. 13. The tricuspid regurgitant velocity is 2.30 m/s, and with an assumed right atrial pressure of 3 mmHg, the estimated right ventricular systolic pressure is normal at 24.2 mmHg. 14. The inferior vena cava is normal in size with greater than 50% respiratory variability, suggesting right atrial pressure of 3 mmHg.  CATH: 03/12/2019  Mid LAD lesion is 80% stenosed.  Ost LAD to Mid LAD lesion is 50% stenosed.  1st Diag lesion is 80% stenosed.  2nd Diag lesion is 75% stenosed.  Mid LM to Dist LM lesion is 25% stenosed.  Prox Cx lesion is 90% stenosed.  1st Mrg lesion is 80% stenosed.  2nd Mrg lesion is 80% stenosed.  Dist RCA lesion is 90% stenosed.  Mid RCA lesion is 75% stenosed.  RPAV lesion is  80% stenosed.  LV end diastolic pressure is moderately elevated.  There is no aortic valve stenosis.  Hemodynamic findings consistent with mild pulmonary hypertension.   Severe multivessel CAD.  Plan for outpatient evaluation for CABG>  His symptoms are all with exertion.  He feels fine at rest.  I instructed him to avoid any strenuous activity, or anything that would bring on shortness of breath.  Continue aspirin and Imdur.   Continue Crestor.   Recent Labs: 03/21/2019: ALT 20 03/26/2019: Magnesium 2.7 04/23/2019: Hemoglobin 10.1; Platelets 360 05/27/2019: BUN 37; Creatinine, Ser 2.22; Potassium 3.9; Sodium 130  CBC    Component Value Date/Time   WBC 7.4 04/23/2019 1358   WBC 11.0 (H) 03/28/2019 0316   RBC 3.77 (L) 04/23/2019 1358   RBC 3.05 (L) 03/28/2019 0316   HGB 10.1 (L) 04/23/2019 1358   HCT 30.8 (L) 04/23/2019 1358   PLT 360 04/23/2019 1358   MCV 82 04/23/2019 1358   MCH 26.8 04/23/2019 1358   MCH 27.9 03/28/2019 0316   MCHC 32.8 04/23/2019 1358   MCHC 32.1 03/28/2019 0316   RDW 13.5 04/23/2019 1358   LYMPHSABS 1.5 04/23/2019 1358   MONOABS 0.6 03/06/2019  1017   EOSABS 0.5 (H) 04/23/2019 1358   BASOSABS 0.1 04/23/2019 1358   CMP Latest Ref Rng & Units 05/27/2019 05/13/2019 04/23/2019  Glucose 70 - 99 mg/dL 218(H) 153(H) 144(H)  BUN 8 - 23 mg/dL 37(H) 24 23  Creatinine 0.61 - 1.24 mg/dL 2.22(H) 1.94(H) 1.86(H)  Sodium 135 - 145 mmol/L 130(L) 138 139  Potassium 3.5 - 5.1 mmol/L 3.9 4.2 4.5  Chloride 98 - 111 mmol/L 98 104 99  CO2 22 - 32 mmol/L 23 25 22   Calcium 8.9 - 10.3 mg/dL 9.0 9.9 9.9  Total Protein 6.5 - 8.1 g/dL - - -  Total Bilirubin 0.3 - 1.2 mg/dL - - -  Alkaline Phos 38 - 126 U/L - - -  AST 15 - 41 U/L - - -  ALT 0 - 44 U/L - - -     Lipid Panel Lab Results  Component Value Date   CHOL 152 12/31/2018   HDL 41 12/31/2018   LDLCALC 77 12/31/2018   TRIG 172 (H) 12/31/2018   CHOLHDL 3.9 04/26/2018      Wt Readings from Last 3 Encounters:  06/03/19 98.9 kg  05/31/19 96.4 kg  05/30/19 98 kg     Other studies Reviewed: Additional studies/ records that were reviewed today include: Office notes, hospital records and testing.  ASSESSMENT AND PLAN:  1.  CAD:  - his sx are much improved since he has recovered from CABG - he is encouraged to start cardiac rehab when able. - continue ASA 325 mg till reduced by TCTS - continue Coreg 25 mg bid, Crestor 10 mg every day  2. HTN - Hydralazine 100 mg was recently increased from bid to tid and amlodipine 5 mg added - BP still up and down, has always had problems getting it to goal - on Coreg 25 mg bid as well. - has only been on the new med regimen a few days, continue this for now. - pt encouraged to follow BP, they will do this at cardiac rehab.   3. Abnl Echo - recent echo w/ WMA seen, but EF improved - prev echo does not mention WMA - spoke w/ Dr Raliegh Ip, since pt doing well symptomatically, ok to cancel MV and follow clinically  4. Chronic Combined CHF - EF 45-50% in  2019, improved to 50-55% by recent echo - +diastolic dysfunction - pt had edema after "discharge",  improved w/ po Lasix - however renal function worsened and Lasix was d/c'd - pt now w/ wt gain and increasing LE edema - will resume Lasix prn only  5. CKD III - Cr 1.34 in 2019, worsened after that and baseline seems to be 1.6-1.7s - Cr up to 2.22 after Lasix therapy - will refer to Dr Arty Baumgartner to get help managing volume and renal function, especially since he is a diabetic.   Current medicines are reviewed at length with the patient today.  The patient does not have concerns regarding medicines.  The following changes have been made:  Make Lasix prn only  Labs/ tests ordered today include:  No orders of the defined types were placed in this encounter.    Disposition:   FU with [name], MD  Signed, Rosaria Ferries, PA-C  06/03/2019 3:33 PM    [place] Phone: [phone]; Fax: [phone]

## 2019-06-03 NOTE — Patient Instructions (Addendum)
Medication Instructions:  Your physician recommends that you continue on your current medications as directed. Please refer to the Current Medication list given to you today.  Take Lasix 40 mg As Needed   *If you need a refill on your cardiac medications before your next appointment, please call your pharmacy*  Lab Work: NONE   If you have labs (blood work) drawn today and your tests are completely normal, you will receive your results only by: Marland Kitchen MyChart Message (if you have MyChart) OR . A paper copy in the mail If you have any lab test that is abnormal or we need to change your treatment, we will call you to review the results.  Testing/Procedures: NONE   Follow-Up: At [facility], you and your health needs are our priority.  As part of our continuing mission to provide you with exceptional heart care, we have created designated Provider Care Teams.  These Care Teams include your primary Cardiologist (physician) and Advanced Practice Providers (APPs -  Physician Assistants and Nurse Practitioners) who all work together to provide you with the care you need, when you need it.  Your next appointment:   3 month(s)  The format for your next appointment:   In Person  Provider:   [name], MD  Other Instructions Thank you for choosing [facility]!     WEIGH DAILY, every am, wearing the same amount of clothing Record weights, for weight gain of 3 lbs in a day or 5 lbs in a week take Lasix 40 mg. Limit sodium to 500 mg per meal, total 2000 mg per day Limit all liquids to 1.5-2 liters/quarts per day Contact [name], MD for wt gain of 10 lbs or more.

## 2019-06-05 ENCOUNTER — Telehealth: Payer: Self-pay | Admitting: "Endocrinology

## 2019-06-05 ENCOUNTER — Encounter (HOSPITAL_COMMUNITY): Payer: Medicaid Other

## 2019-06-05 NOTE — Telephone Encounter (Signed)
Pt said on the day he came, which was 12/17, that night his sugar was 251 at bedtime and on 12/18 that morning it was 172, bedtime was 293. 12/19 that morning it was 111, at bedtime it was 131. On 12/20 it was 101 that morning and bedtime was 102. This morning it was 86

## 2019-06-05 NOTE — Telephone Encounter (Signed)
Pt's wife called and said that he is having trouble sleeping ever since he started the insulin at night. She would like to know should he switch to taking it in the morning?

## 2019-06-05 NOTE — Telephone Encounter (Signed)
Spoke w/ patient, he will call back with readings. He would like to know if you could write a script for a freestyle libre. [place].

## 2019-06-05 NOTE — Telephone Encounter (Signed)
Yes, he can take it in the morning b/n 8-9 AM everyday consistently . I like to know some of his glucose  readings since he started insulin.

## 2019-06-17 ENCOUNTER — Ambulatory Visit (INDEPENDENT_AMBULATORY_CARE_PROVIDER_SITE_OTHER): Payer: Medicaid Other | Admitting: "Endocrinology

## 2019-06-17 ENCOUNTER — Encounter: Payer: Self-pay | Admitting: "Endocrinology

## 2019-06-17 DIAGNOSIS — I10 Essential (primary) hypertension: Secondary | ICD-10-CM | POA: Diagnosis not present

## 2019-06-17 DIAGNOSIS — E782 Mixed hyperlipidemia: Secondary | ICD-10-CM

## 2019-06-17 DIAGNOSIS — E1159 Type 2 diabetes mellitus with other circulatory complications: Secondary | ICD-10-CM | POA: Diagnosis not present

## 2019-06-17 MED ORDER — FREESTYLE LIBRE 14 DAY READER DEVI: 1.0000 | Freq: Once | 0 refills | Status: AC

## 2019-06-17 MED ORDER — FREESTYLE LIBRE 14 DAY SENSOR MISC: 1.0000 | 2 refills | Status: DC

## 2019-06-17 NOTE — Progress Notes (Signed)
06/17/2019, 12:41 PM  Endocrinology follow-up note   Subjective:    Patient ID: [name], male    DOB: 12/30/1955.  [name] is being seen in follow-up after he was seen in consultation for management of currently uncontrolled symptomatic diabetes requested by  [name], MD.   Past Medical History:  Diagnosis Date  . CAD (coronary artery disease) 03/03/2019  . Hypertension   . Type 2 diabetes mellitus with diabetic nephropathy ([place]) 04/23/2019    Past Surgical History:  Procedure Laterality Date  . CORONARY ARTERY BYPASS GRAFT N/A 03/25/2019   Procedure: CORONARY ARTERY BYPASS GRAFTING (CABG) X  4 USING LEFT INTERNAL MAMMARY ARTERY AND RIGHT SAPHENOUS VEIN GRAFTS;  Surgeon: [name], MD;  Location: [facility];  Service: Open Heart Surgery;  Laterality: N/A;  . EXCISION MORTON'S NEUROMA    . RIGHT/LEFT HEART CATH AND CORONARY ANGIOGRAPHY N/A 03/12/2019   Procedure: RIGHT/LEFT HEART CATH AND CORONARY ANGIOGRAPHY;  Surgeon: [name], MD;  Location: [facility];  Service: Cardiovascular;  Laterality: N/A;  . TEE WITHOUT CARDIOVERSION N/A 03/25/2019   Procedure: TRANSESOPHAGEAL ECHOCARDIOGRAM (TEE);  Surgeon: [name], MD;  Location: [facility];  Service: Open Heart Surgery;  Laterality: N/A;    Social History   Socioeconomic History  . Marital status: Single    Spouse name: Not on file  . Number of children: Not on file  . Years of education: Not on file  . Highest education level: Not on file  Occupational History  . Not on file  Tobacco Use  . Smoking status: Former Smoker    Quit date: 07/06/1988    Years since quitting: 30.9  . Smokeless tobacco: Never Used  Substance and Sexual Activity  . Alcohol use: Not Currently    Comment: socially  . Drug use: No  . Sexual activity: Not on file  Other Topics Concern  . Not on file  Social History  Narrative  . Not on file   Social Determinants of Health   Financial Resource Strain:   . Difficulty of Paying Living Expenses: Not on file  Food Insecurity:   . Worried About Charity fundraiser in the Last Year: Not on file  . Ran Out of Food in the Last Year: Not on file  Transportation Needs:   . Lack of Transportation (Medical): Not on file  . Lack of Transportation (Non-Medical): Not on file  Physical Activity:   . Days of Exercise per Week: Not on file  . Minutes of Exercise per Session: Not on file  Stress:   . Feeling of Stress : Not on file  Social Connections:   . Frequency of Communication with Friends and Family: Not on file  . Frequency of Social Gatherings with Friends and Family: Not on file  . Attends Religious Services: Not on file  . Active Member of Clubs or Organizations: Not on file  . Attends Archivist Meetings: Not on file  . Marital Status: Not on file    Family History  Problem Relation Age of Onset  . Cancer Mother   . Heart Problems Father  Outpatient Encounter Medications as of 06/17/2019  Medication Sig  . acetaminophen (TYLENOL) 500 MG tablet Take 1,000 mg by mouth every 6 (six) hours as needed for moderate pain or headache.  Marland Kitchen amLODipine (NORVASC) 5 MG tablet Take 1 tablet (5 mg total) by mouth daily.  Marland Kitchen aspirin 325 MG EC tablet Take 1 tablet (325 mg total) by mouth daily.  . carvedilol (COREG) 25 MG tablet Take 1 tablet (25 mg total) by mouth 2 (two) times daily with a meal.  . Continuous Blood Gluc Receiver (FREESTYLE LIBRE 14 DAY READER) DEVI 1 each by Does not apply route once for 1 dose.  . Continuous Blood Gluc Receiver (FREESTYLE LIBRE 14 DAY READER) DEVI 1 each by Does not apply route once for 1 dose.  . Continuous Blood Gluc Sensor (FREESTYLE LIBRE 14 DAY SENSOR) MISC Inject 1 each into the skin every 14 (fourteen) days. Use as directed.  . Cyanocobalamin (B-12) 2500 MCG TABS Take 2,500 mcg by mouth daily.  .  diphenhydramine-acetaminophen (TYLENOL PM) 25-500 MG TABS tablet Take 1 tablet by mouth at bedtime as needed (sleep).  . furosemide (LASIX) 40 MG tablet Take 1 tablet (40 mg total) by mouth daily as needed.  Marland Kitchen glipiZIDE (GLUCOTROL) 5 MG tablet Take 1 tablet (5 mg total) by mouth 2 (two) times daily before a meal.  . hydrALAZINE (APRESOLINE) 100 MG tablet Take 1 tablet (100 mg total) by mouth 3 (three) times daily.  . Insulin Pen Needle (B-D ULTRAFINE III SHORT PEN) 31G X 8 MM MISC 1 each by Does not apply route as directed.  . Multiple Vitamin (MULTIVITAMIN WITH MINERALS) TABS tablet Take 1 tablet by mouth daily.  . rosuvastatin (CRESTOR) 10 MG tablet Take 1 tablet (10 mg total) by mouth daily.  . [DISCONTINUED] Continuous Blood Gluc Sensor (FREESTYLE LIBRE 14 DAY SENSOR) MISC Inject 1 each into the skin every 14 (fourteen) days. Use as directed.  . [DISCONTINUED] Insulin Glargine (LANTUS SOLOSTAR) 100 UNIT/ML Solostar Pen Inject 20 Units into the skin daily.   No facility-administered encounter medications on file as of 06/17/2019.    ALLERGIES: Allergies  Allergen Reactions  . Bee Venom Anaphylaxis    VACCINATION STATUS: Immunization History  Administered Date(s) Administered  . Pneumococcal Polysaccharide-23 07/16/2018  . Zoster Recombinat (Shingrix) 04/25/2019    Diabetes He presents for his follow-up diabetic visit. He has type 2 diabetes mellitus. Onset time: He was diagnosed at approximate age of 77 years. His disease course has been improving. There are no hypoglycemic associated symptoms. Pertinent negatives for hypoglycemia include no confusion, headaches, pallor or seizures. Pertinent negatives for diabetes include no chest pain, no fatigue, no polydipsia, no polyphagia, no polyuria and no weakness. There are no hypoglycemic complications. Symptoms are improving. Diabetic complications include heart disease and nephropathy. (Patient is a status post quadruple bypass surgery in  October 2019.) Risk factors for coronary artery disease include dyslipidemia, diabetes mellitus, family history, obesity, hypertension, male sex, tobacco exposure and sedentary lifestyle. Current diabetic treatments: Is currently on glipizide 10 mg p.o. twice daily. His weight is fluctuating minimally. He is following a generally unhealthy diet. When asked about meal planning, he reported none. He has had a previous visit with a dietitian. He participates in exercise intermittently. His home blood glucose trend is increasing steadily. His breakfast blood glucose range is generally 130-140 mg/dl. His bedtime blood glucose range is generally 140-180 mg/dl. His overall blood glucose range is 140-180 mg/dl. (He reports controlled fasting glycemic profile, still slightly  above target postprandial blood glucose profile he is responding and tolerating low-dose Lantus along with his glipizide.   His most recent A1c was 8.2% in October 2020.) An ACE inhibitor/angiotensin II receptor blocker is being taken. Eye exam is current.  Hyperlipidemia This is a chronic problem. The current episode started more than 1 year ago. The problem is controlled. Exacerbating diseases include chronic renal disease, diabetes and obesity. Pertinent negatives include no chest pain, myalgias or shortness of breath. Current antihyperlipidemic treatment includes statins. Risk factors for coronary artery disease include family history, dyslipidemia, diabetes mellitus, hypertension, male sex, obesity and a sedentary lifestyle.  Hypertension This is a chronic problem. The current episode started more than 1 year ago. The problem is controlled. Pertinent negatives include no chest pain, headaches, neck pain, palpitations or shortness of breath. Risk factors for coronary artery disease include diabetes mellitus, dyslipidemia and male gender. Past treatments include angiotensin blockers. Hypertensive end-organ damage includes kidney disease and  CAD/MI. Identifiable causes of hypertension include chronic renal disease.    Review of systems: Limited as above.  Objective:    There were no vitals taken for this visit.  Wt Readings from Last 3 Encounters:  06/03/19 218 lb (98.9 kg)  05/31/19 212 lb 10.1 oz (96.4 kg)  05/30/19 216 lb (98 kg)    CMP ( most recent) CMP     Component Value Date/Time   NA 130 (L) 05/27/2019 1445   NA 139 04/23/2019 1358   K 3.9 05/27/2019 1445   CL 98 05/27/2019 1445   CO2 23 05/27/2019 1445   GLUCOSE 218 (H) 05/27/2019 1445   BUN 37 (H) 05/27/2019 1445   BUN 23 04/23/2019 1358   CREATININE 2.22 (H) 05/27/2019 1445   CREATININE 1.94 (H) 05/13/2019 1029   CALCIUM 9.0 05/27/2019 1445   PROT 6.7 03/21/2019 1526   ALBUMIN 3.9 03/21/2019 1526   AST 19 03/21/2019 1526   ALT 20 03/21/2019 1526   ALKPHOS 56 03/21/2019 1526   BILITOT 0.4 03/21/2019 1526   GFRNONAA 31 (L) 05/27/2019 1445   GFRAA 35 (L) 05/27/2019 1445     Diabetic Labs (most recent): Lab Results  Component Value Date   HGBA1C 8.2 (H) 03/25/2019   HGBA1C 6.8 (A) 03/18/2019   HGBA1C 8.1 (A) 11/13/2018     Lipid Panel ( most recent) Lipid Panel     Component Value Date/Time   CHOL 152 12/31/2018 0958   TRIG 172 (H) 12/31/2018 0958   HDL 41 12/31/2018 0958   CHOLHDL 3.9 04/26/2018 1011   VLDL 20 04/26/2018 1011   LDLCALC 77 12/31/2018 0958   LABVLDL 34 12/31/2018 0958      Lab Results  Component Value Date   TSH 2.110 04/29/2018      Assessment & Plan:   1. DM type 2 causing vascular disease ([name])  - [name] has currently uncontrolled symptomatic type 2 DM since  64 years of age. -He has responded to the addition of basal insulin with controlled fasting glycemic profile, recent A1c of 8.2%.  - I had a long discussion with him about the progressive nature of diabetes and the pathology behind its complications. -his diabetes is complicated by coronary artery disease, CKD and he remains at a high  risk for more acute and chronic complications which include CAD, CVA, CKD, retinopathy, and neuropathy. These are all discussed in detail with him.  - I have counseled him on diet  and weight management  by adopting a carbohydrate  restricted/protein rich diet. Patient is encouraged to switch to  unprocessed or minimally processed     complex starch and increased protein intake (animal or plant source), fruits, and vegetables. -  he is advised to stick to a routine mealtimes to eat 3 meals  a day and avoid unnecessary snacks ( to snack only to correct hypoglycemia).   - he  admits there is a room for improvement in his diet and drink choices. -  Suggestion is made for him to avoid simple carbohydrates  from his diet including Cakes, Sweet Desserts / Pastries, Ice Cream, Soda (diet and regular), Sweet Tea, Candies, Chips, Cookies, Sweet Pastries,  Store Bought Juices, Alcohol in Excess of  1-2 drinks a day, Artificial Sweeteners, Coffee Creamer, and "Sugar-free" Products. This will help patient to have stable blood glucose profile and potentially avoid unintended weight gain.  - he will be scheduled with [name], RDN, CDE for diabetes education.  - I have approached him with the following individualized plan to manage  his diabetes and patient agrees:   -Based on his presentation with controlled glycemic profile at fasting, he will continue to need at least basal insulin to maintain control of diabetes to target.    He prefers to taking Lantus in the morning, advised to continue Lantus 20 units daily with breakfast between 8 and 9 AM, continue to monitor blood glucose twice a day-daily before breakfast and at bedtime.  - he is encouraged to call clinic for blood glucose levels less than 70 or above 200 mg /dl. - he is advised to continue glipizide 5 mg p.o. twice daily with breakfast and supper.    - he is not a candidate for Metformin, SGLT2 inhibitors due to concurrent renal  insufficiency.  - he will be considered for incretin therapy as appropriate next visit.  - Specific targets for  A1c;  LDL, HDL, Triglycerides, and  Waist Circumference were discussed with the patient.  2) Blood Pressure /Hypertension: he is advised to home monitor blood pressure and report if > 140/90 on 2 separate readings. He has enough prescription for blood pressure control.  he is advised to continue his current medications including hydralazine 100 mg p.o. twice daily, losartan, amlodipine 10 mg p.o. daily, carvedilol 25 mg p.o. twice daily.  3) Lipids/Hyperlipidemia:   Review of his recent lipid panel showed  controlled  LDL at 77 .  he   will continue  rosuvastatin 10 mg p.o. nightly.  Side effects and precautions discussed with him.  4)  Weight/Diet: His BMI is 31-   clearly complicating his diabetes care.   he is  a candidate for weight loss. I discussed with him the fact that loss of 5 - 10% of his  current body weight will have the most impact on his diabetes management.  Exercise, and detailed carbohydrates information provided  -  detailed on discharge instructions.  5) Chronic Care/Health Maintenance:  -he  is on ACEI/ARB and Statin medications and  is encouraged to initiate and continue to follow up with Ophthalmology, Dentist,  Podiatrist at least yearly or according to recommendations, and advised to  stay away from smoking. I have recommended yearly flu vaccine and pneumonia vaccine at least every 5 years; moderate intensity exercise for up to 150 minutes weekly; and  sleep for at least 7 hours a day.  - he is  advised to maintain close follow up with [name], MD for primary care needs, as well as his  other providers for optimal and coordinated care.  - Time spent on this patient care encounter:  35 min, of which >50% was spent in  counseling and the rest reviewing his  current and  previous labs/studies ( including abstraction from other facilities),  previous  treatments, his blood glucose readings, and medications' doses and developing a plan for long-term care based on the latest recommendations for standards of care; and documenting his care.  Reita Cliche Smolinski participated in the discussions, expressed understanding, and voiced agreement with the above plans.  All questions were answered to his satisfaction. he is encouraged to contact clinic should he have any questions or concerns prior to his return visit.  Follow up plan: - Return in about 9 weeks (around 08/19/2019) for Bring Meter and Logs- A1c in Office, Include 8 log sheets.  Glade Lloyd, MD [facility] [address] [place], [place] [address] Phone: [phone]  Fax: [phone]    06/17/2019, 12:41 PM  This note was partially dictated with voice recognition software. Similar sounding words can be transcribed inadequately or may not  be corrected upon review.

## 2019-06-18 ENCOUNTER — Ambulatory Visit: Payer: Medicaid Other | Admitting: Family Medicine

## 2019-06-25 ENCOUNTER — Other Ambulatory Visit (HOSPITAL_COMMUNITY)
Admission: RE | Admit: 2019-06-25 | Discharge: 2019-06-25 | Disposition: A | Discharge status: Home / Self Care | Payer: Medicaid Other | Source: Ambulatory Visit | Attending: Internal Medicine | Admitting: Internal Medicine

## 2019-06-25 ENCOUNTER — Other Ambulatory Visit: Payer: Self-pay

## 2019-06-25 DIAGNOSIS — Z20822 Contact with and (suspected) exposure to covid-19: Secondary | ICD-10-CM | POA: Insufficient documentation

## 2019-06-25 DIAGNOSIS — Z01812 Encounter for preprocedural laboratory examination: Secondary | ICD-10-CM | POA: Diagnosis not present

## 2019-06-25 LAB — SARS CORONAVIRUS 2 (TAT 6-24 HRS): SARS Coronavirus 2: NEGATIVE

## 2019-06-26 ENCOUNTER — Telehealth: Payer: Self-pay | Admitting: "Endocrinology

## 2019-06-26 NOTE — Telephone Encounter (Signed)
Pt.notified

## 2019-06-26 NOTE — Telephone Encounter (Signed)
Pt is having a Colonoscopy tomorrow, does he need to hold his insulin and glipizide? Please advise.

## 2019-06-26 NOTE — Telephone Encounter (Signed)
Yes, he can skip both for that morning. Can take these 2  meds as soon as he is cleared for regular food.

## 2019-06-27 ENCOUNTER — Telehealth (HOSPITAL_COMMUNITY): Payer: Self-pay | Admitting: *Deleted

## 2019-06-27 ENCOUNTER — Other Ambulatory Visit: Payer: Self-pay

## 2019-06-27 ENCOUNTER — Encounter (HOSPITAL_COMMUNITY): Payer: Self-pay | Admitting: Internal Medicine

## 2019-06-27 ENCOUNTER — Encounter (HOSPITAL_COMMUNITY)
Admission: RE | Disposition: A | Discharge status: Home / Self Care | Payer: Self-pay | Source: Home / Self Care | Attending: Internal Medicine

## 2019-06-27 ENCOUNTER — Ambulatory Visit (HOSPITAL_COMMUNITY)
Admission: RE | Admit: 2019-06-27 | Discharge: 2019-06-27 | Disposition: A | Discharge status: Home / Self Care | Payer: Medicaid Other | Attending: Internal Medicine | Admitting: Internal Medicine

## 2019-06-27 DIAGNOSIS — Z87892 Personal history of anaphylaxis: Secondary | ICD-10-CM | POA: Insufficient documentation

## 2019-06-27 DIAGNOSIS — I10 Essential (primary) hypertension: Secondary | ICD-10-CM | POA: Diagnosis not present

## 2019-06-27 DIAGNOSIS — E1140 Type 2 diabetes mellitus with diabetic neuropathy, unspecified: Secondary | ICD-10-CM | POA: Diagnosis not present

## 2019-06-27 DIAGNOSIS — K5730 Diverticulosis of large intestine without perforation or abscess without bleeding: Secondary | ICD-10-CM | POA: Insufficient documentation

## 2019-06-27 DIAGNOSIS — Z91030 Bee allergy status: Secondary | ICD-10-CM | POA: Diagnosis not present

## 2019-06-27 DIAGNOSIS — D122 Benign neoplasm of ascending colon: Secondary | ICD-10-CM | POA: Insufficient documentation

## 2019-06-27 DIAGNOSIS — D125 Benign neoplasm of sigmoid colon: Secondary | ICD-10-CM | POA: Insufficient documentation

## 2019-06-27 DIAGNOSIS — K644 Residual hemorrhoidal skin tags: Secondary | ICD-10-CM | POA: Insufficient documentation

## 2019-06-27 DIAGNOSIS — Z951 Presence of aortocoronary bypass graft: Secondary | ICD-10-CM | POA: Diagnosis not present

## 2019-06-27 DIAGNOSIS — Z87891 Personal history of nicotine dependence: Secondary | ICD-10-CM | POA: Diagnosis not present

## 2019-06-27 DIAGNOSIS — I2510 Atherosclerotic heart disease of native coronary artery without angina pectoris: Secondary | ICD-10-CM | POA: Insufficient documentation

## 2019-06-27 DIAGNOSIS — Z7982 Long term (current) use of aspirin: Secondary | ICD-10-CM | POA: Insufficient documentation

## 2019-06-27 DIAGNOSIS — Z7984 Long term (current) use of oral hypoglycemic drugs: Secondary | ICD-10-CM | POA: Diagnosis not present

## 2019-06-27 DIAGNOSIS — Z1211 Encounter for screening for malignant neoplasm of colon: Secondary | ICD-10-CM | POA: Insufficient documentation

## 2019-06-27 DIAGNOSIS — Z79899 Other long term (current) drug therapy: Secondary | ICD-10-CM | POA: Diagnosis not present

## 2019-06-27 DIAGNOSIS — Z800 Family history of malignant neoplasm of digestive organs: Secondary | ICD-10-CM | POA: Insufficient documentation

## 2019-06-27 HISTORY — PX: POLYPECTOMY: SHX5525

## 2019-06-27 HISTORY — PX: COLONOSCOPY: SHX5424

## 2019-06-27 HISTORY — PX: BIOPSY: SHX5522

## 2019-06-27 LAB — GLUCOSE, CAPILLARY: Glucose-Capillary: 116 mg/dL — ABNORMAL HIGH (ref 70–99)

## 2019-06-27 SURGERY — COLONOSCOPY
Anesthesia: Moderate Sedation

## 2019-06-27 MED ORDER — MIDAZOLAM HCL 5 MG/5ML IJ SOLN
INTRAMUSCULAR | Status: DC | PRN
  Administered 2019-06-27 (×2): 2 mg via INTRAVENOUS

## 2019-06-27 MED ORDER — MEPERIDINE HCL 50 MG/ML IJ SOLN
INTRAMUSCULAR | Status: DC | PRN
  Administered 2019-06-27 (×2): 25 mg via INTRAVENOUS

## 2019-06-27 MED ORDER — STERILE WATER FOR IRRIGATION IR SOLN
Status: DC | PRN
  Administered 2019-06-27: 1.5 mL

## 2019-06-27 MED ORDER — MEPERIDINE HCL 50 MG/ML IJ SOLN
INTRAMUSCULAR | Status: AC
  Filled 2019-06-27: qty 1

## 2019-06-27 MED ORDER — SODIUM CHLORIDE 0.9 % IV SOLN: INTRAVENOUS | Status: DC

## 2019-06-27 MED ORDER — MIDAZOLAM HCL 5 MG/5ML IJ SOLN
INTRAMUSCULAR | Status: AC
  Filled 2019-06-27: qty 10

## 2019-06-27 NOTE — Telephone Encounter (Signed)
Called patient again to discuss virtual app. Left him messages to call me back. I will now wait to hear from him to call us back to see if he is interested.

## 2019-06-27 NOTE — Discharge Instructions (Signed)
Resume aspirin this evening at usual dose. Resume other medications as before. High-fiber modified carb diet. No driving for 24 hours. Physician will call with biopsy results.   Colonoscopy, Adult, Care After This sheet gives you information about how to care for yourself after your procedure. Your doctor may also give you more specific instructions. If you have problems or questions, call your doctor. What can I expect after the procedure? After the procedure, it is common to have:  A small amount of blood in your poop (stool) for 24 hours.  Some gas.  Mild cramping or bloating in your belly (abdomen). Follow these instructions at home: Eating and drinking   Drink enough fluid to keep your pee (urine) pale yellow.  Follow instructions from your doctor about what you cannot eat or drink.  Return to your normal diet as told by your doctor. Avoid heavy or fried foods that are hard to digest. Activity  Rest as told by your doctor.  Do not sit for a long time without moving. Get up to take short walks every 1-2 hours. This is important. Ask for help if you feel weak or unsteady.  Return to your normal activities as told by your doctor. Ask your doctor what activities are safe for you. To help cramping and bloating:   Try walking around.  Put heat on your belly as told by your doctor. Use the heat source that your doctor recommends, such as a moist heat pack or a heating pad. ? Put a towel between your skin and the heat source. ? Leave the heat on for 20-30 minutes. ? Remove the heat if your skin turns bright red. This is very important if you are unable to feel pain, heat, or cold. You may have a greater risk of getting burned. General instructions  For the first 24 hours after the procedure: ? Do not drive or use machinery. ? Do not sign important documents. ? Do not drink alcohol. ? Do your daily activities more slowly than normal. ? Eat foods that are soft and easy to  digest.  Take over-the-counter or prescription medicines only as told by your doctor.  Keep all follow-up visits as told by your doctor. This is important. Contact a doctor if:  You have blood in your poop 2-3 days after the procedure. Get help right away if:  You have more than a small amount of blood in your poop.  You see large clumps of tissue (blood clots) in your poop.  Your belly is swollen.  You feel like you may vomit (nauseous).  You vomit.  You have a fever.  You have belly pain that gets worse, and medicine does not help your pain. Summary  After the procedure, it is common to have a small amount of blood in your poop. You may also have mild cramping and bloating in your belly.  For the first 24 hours after the procedure, do not drive or use machinery, do not sign important documents, and do not drink alcohol.  Get help right away if you have a lot of blood in your poop, feel like you may vomit, have a fever, or have more belly pain. This information is not intended to replace advice given to you by your health care provider. Make sure you discuss any questions you have with your health care provider. Document Revised: 12/24/2018 Document Reviewed: 12/24/2018 Elsevier Patient Education  [place].  High-Fiber Diet Fiber, also called dietary fiber, is a type  of carbohydrate that is found in fruits, vegetables, whole grains, and beans. A high-fiber diet can have many health benefits. Your health care provider may recommend a high-fiber diet to help:  Prevent constipation. Fiber can make your bowel movements more regular.  Lower your cholesterol.  Relieve the following conditions: ? Swelling of veins in the anus (hemorrhoids). ? Swelling and irritation (inflammation) of specific areas of the digestive tract (uncomplicated diverticulosis). ? A problem of the large intestine (colon) that sometimes causes pain and diarrhea (irritable bowel syndrome,  IBS).  Prevent overeating as part of a weight-loss plan.  Prevent heart disease, type 2 diabetes, and certain cancers. What is my plan? The recommended daily fiber intake in grams (g) includes:  38 g for men age 71 or younger.  30 g for men over age 67.  29 g for women age 4 or younger.  21 g for women over age 32. You can get the recommended daily intake of dietary fiber by:  Eating a variety of fruits, vegetables, grains, and beans.  Taking a fiber supplement, if it is not possible to get enough fiber through your diet. What do I need to know about a high-fiber diet?  It is better to get fiber through food sources rather than from fiber supplements. There is not a lot of research about how effective supplements are.  Always check the fiber content on the nutrition facts label of any prepackaged food. Look for foods that contain 5 g of fiber or more per serving.  Talk with a diet and nutrition specialist (dietitian) if you have questions about specific foods that are recommended or not recommended for your medical condition, especially if those foods are not listed below.  Gradually increase how much fiber you consume. If you increase your intake of dietary fiber too quickly, you may have bloating, cramping, or gas.  Drink plenty of water. Water helps you to digest fiber. What are tips for following this plan?  Eat a wide variety of high-fiber foods.  Make sure that half of the grains that you eat each day are whole grains.  Eat breads and cereals that are made with whole-grain flour instead of refined flour or white flour.  Eat brown rice, bulgur wheat, or millet instead of white rice.  Start the day with a breakfast that is high in fiber, such as a cereal that contains 5 g of fiber or more per serving.  Use beans in place of meat in soups, salads, and pasta dishes.  Eat high-fiber snacks, such as berries, raw vegetables, nuts, and popcorn.  Choose whole fruits and  vegetables instead of processed forms like juice or sauce. What foods can I eat?  Fruits Berries. Pears. Apples. Oranges. Avocado. Prunes and raisins. Dried figs. Vegetables Sweet potatoes. Spinach. Kale. Artichokes. Cabbage. Broccoli. Cauliflower. Green peas. Carrots. Squash. Grains Whole-grain breads. Multigrain cereal. Oats and oatmeal. Brown rice. Barley. Bulgur wheat. [place]. Quinoa. Bran muffins. Popcorn. Rye wafer crackers. Meats and other proteins Navy, kidney, and pinto beans. Soybeans. Split peas. Lentils. Nuts and seeds. Dairy Fiber-fortified yogurt. Beverages Fiber-fortified soy milk. Fiber-fortified orange juice. Other foods Fiber bars. The items listed above may not be a complete list of recommended foods and beverages. Contact a dietitian for more options. What foods are not recommended? Fruits Fruit juice. Cooked, strained fruit. Vegetables Fried potatoes. Canned vegetables. Well-cooked vegetables. Grains White bread. Pasta made with refined flour. White rice. Meats and other proteins Fatty cuts of meat. Fried chicken or fried  fish. Dairy Milk. Yogurt. Cream cheese. Sour cream. Fats and oils Butters. Beverages Soft drinks. Other foods Cakes and pastries. The items listed above may not be a complete list of foods and beverages to avoid. Contact a dietitian for more information. Summary  Fiber is a type of carbohydrate. It is found in fruits, vegetables, whole grains, and beans.  There are many health benefits of eating a high-fiber diet, such as preventing constipation, lowering blood cholesterol, helping with weight loss, and reducing your risk of heart disease, diabetes, and certain cancers.  Gradually increase your intake of fiber. Increasing too fast can result in cramping, bloating, and gas. Drink plenty of water while you increase your fiber.  The best sources of fiber include whole fruits and vegetables, whole grains, nuts, seeds, and beans. This  information is not intended to replace advice given to you by your health care provider. Make sure you discuss any questions you have with your health care provider. Document Revised: 04/03/2017 Document Reviewed: 04/03/2017 Elsevier Patient Education  [place].  Diverticulosis  Diverticulosis is a condition that develops when small pouches (diverticula) form in the wall of the large intestine (colon). The colon is where water is absorbed and stool (feces) is formed. The pouches form when the inside layer of the colon pushes through weak spots in the outer layers of the colon. You may have a few pouches or many of them. The pouches usually do not cause problems unless they become inflamed or infected. When this happens, the condition is called diverticulitis. What are the causes? The cause of this condition is not known. What increases the risk? The following factors may make you more likely to develop this condition:  Being older than age 5. Your risk for this condition increases with age. Diverticulosis is rare among people younger than age 80. By age 8, many people have it.  Eating a low-fiber diet.  Having frequent constipation.  Being overweight.  Not getting enough exercise.  Smoking.  Taking over-the-counter pain medicines, like aspirin and ibuprofen.  Having a family history of diverticulosis. What are the signs or symptoms? In most people, there are no symptoms of this condition. If you do have symptoms, they may include:  Bloating.  Cramps in the abdomen.  Constipation or diarrhea.  Pain in the lower left side of the abdomen. How is this diagnosed? Because diverticulosis usually has no symptoms, it is most often diagnosed during an exam for other colon problems. The condition may be diagnosed by:  Using a flexible scope to examine the colon (colonoscopy).  Taking an X-ray of the colon after dye has been put into the colon (barium enema).  Having a CT  scan. How is this treated? You may not need treatment for this condition. Your health care provider may recommend treatment to prevent problems. You may need treatment if you have symptoms or if you previously had diverticulitis. Treatment may include:  Eating a high-fiber diet.  Taking a fiber supplement.  Taking a live bacteria supplement (probiotic).  Taking medicine to relax your colon. Follow these instructions at home: Medicines  Take over-the-counter and prescription medicines only as told by your health care provider.  If told by your health care provider, take a fiber supplement or probiotic. Constipation prevention Your condition may cause constipation. To prevent or treat constipation, you may need to:  Drink enough fluid to keep your urine pale yellow.  Take over-the-counter or prescription medicines.  Eat foods that are high in  fiber, such as beans, whole grains, and fresh fruits and vegetables.  Limit foods that are high in fat and processed sugars, such as fried or sweet foods.  General instructions  Try not to strain when you have a bowel movement.  Keep all follow-up visits as told by your health care provider. This is important. Contact a health care provider if you:  Have pain in your abdomen.  Have bloating.  Have cramps.  Have not had a bowel movement in 3 days. Get help right away if:  Your pain gets worse.  Your bloating becomes very bad.  You have a fever or chills, and your symptoms suddenly get worse.  You vomit.  You have bowel movements that are bloody or black.  You have bleeding from your rectum. Summary  Diverticulosis is a condition that develops when small pouches (diverticula) form in the wall of the large intestine (colon).  You may have a few pouches or many of them.  This condition is most often diagnosed during an exam for other colon problems.  Treatment may include increasing the fiber in your diet, taking  supplements, or taking medicines. This information is not intended to replace advice given to you by your health care provider. Make sure you discuss any questions you have with your health care provider. Document Revised: 12/27/2018 Document Reviewed: 12/27/2018 Elsevier Patient Education  [place].  Hemorrhoids Hemorrhoids are swollen veins that may develop:  In the butt (rectum). These are called internal hemorrhoids.  Around the opening of the butt (anus). These are called external hemorrhoids. Hemorrhoids can cause pain, itching, or bleeding. Most of the time, they do not cause serious problems. They usually get better with diet changes, lifestyle changes, and other home treatments. What are the causes? This condition may be caused by:  Having trouble pooping (constipation).  Pushing hard (straining) to poop.  Watery poop (diarrhea).  Pregnancy.  Being very overweight (obese).  Sitting for long periods of time.  Heavy lifting or other activity that causes you to strain.  Anal sex.  Riding a bike for a long period of time. What are the signs or symptoms? Symptoms of this condition include:  Pain.  Itching or soreness in the butt.  Bleeding from the butt.  Leaking poop.  Swelling in the area.  One or more lumps around the opening of your butt. How is this diagnosed? A doctor can often diagnose this condition by looking at the affected area. The doctor may also:  Do an exam that involves feeling the area with a gloved hand (digital rectal exam).  Examine the area inside your butt using a small tube (anoscope).  Order blood tests. This may be done if you have lost a lot of blood.  Have you get a test that involves looking inside the colon using a flexible tube with a camera on the end (sigmoidoscopy or colonoscopy). How is this treated? This condition can usually be treated at home. Your doctor may tell you to change what you eat, make lifestyle  changes, or try home treatments. If these do not help, procedures can be done to remove the hemorrhoids or make them smaller. These may involve:  Placing rubber bands at the base of the hemorrhoids to cut off their blood supply.  Injecting medicine into the hemorrhoids to shrink them.  Shining a type of light energy onto the hemorrhoids to cause them to fall off.  Doing surgery to remove the hemorrhoids or cut off their  blood supply. Follow these instructions at home: Eating and drinking   Eat foods that have a lot of fiber in them. These include whole grains, beans, nuts, fruits, and vegetables.  Ask your doctor about taking products that have added fiber (fibersupplements).  Reduce the amount of fat in your diet. You can do this by: ? Eating low-fat dairy products. ? Eating less red meat. ? Avoiding processed foods.  Drink enough fluid to keep your pee (urine) pale yellow. Managing pain and swelling   Take a warm-water bath (sitz bath) for 20 minutes to ease pain. Do this 3-4 times a day. You may do this in a bathtub or using a portable sitz bath that fits over the toilet.  If told, put ice on the painful area. It may be helpful to use ice between your warm baths. ? Put ice in a plastic bag. ? Place a towel between your skin and the bag. ? Leave the ice on for 20 minutes, 2-3 times a day. General instructions  Take over-the-counter and prescription medicines only as told by your doctor. ? Medicated creams and medicines may be used as told.  Exercise often. Ask your doctor how much and what kind of exercise is best for you.  Go to the bathroom when you have the urge to poop. Do not wait.  Avoid pushing too hard when you poop.  Keep your butt dry and clean. Use wet toilet paper or moist towelettes after pooping.  Do not sit on the toilet for a long time.  Keep all follow-up visits as told by your doctor. This is important. Contact a doctor if you:  Have pain and  swelling that do not get better with treatment or medicine.  Have trouble pooping.  Cannot poop.  Have pain or swelling outside the area of the hemorrhoids. Get help right away if you have:  Bleeding that will not stop. Summary  Hemorrhoids are swollen veins in the butt or around the opening of the butt.  They can cause pain, itching, or bleeding.  Eat foods that have a lot of fiber in them. These include whole grains, beans, nuts, fruits, and vegetables.  Take a warm-water bath (sitz bath) for 20 minutes to ease pain. Do this 3-4 times a day. This information is not intended to replace advice given to you by your health care provider. Make sure you discuss any questions you have with your health care provider. Document Revised: 06/07/2018 Document Reviewed: 10/19/2017 Elsevier Patient Education  [place].

## 2019-06-27 NOTE — Op Note (Signed)
[facility] Patient Name: [name] Procedure Date: 06/27/2019 9:50 AM MRN: [id number] Date of Birth: 1955-07-02 Attending MD: [name] , MD CSN: [id number] Age: 64 Admit Type: Outpatient Procedure:                Colonoscopy Indications:              Screening for colorectal malignant neoplasm Providers:                [name], MD, Charlsie Quest. Theda Sers RN, RN, Nelma Rothman, Technician Referring MD:             [name]. Wolfgang Phoenix, MD Medicines:                Meperidine 50 mg IV, Midazolam 6 mg IV Complications:            No immediate complications. Estimated Blood Loss:     Estimated blood loss was minimal. Procedure:                Pre-Anesthesia Assessment:                           - Prior to the procedure, a History and Physical                            was performed, and patient medications and                            allergies were reviewed. The patient's tolerance of                            previous anesthesia was also reviewed. The risks                            and benefits of the procedure and the sedation                            options and risks were discussed with the patient.                            All questions were answered, and informed consent                            was obtained. Prior Anticoagulants: The patient has                            taken no previous anticoagulant or antiplatelet                            agents except for aspirin. ASA Grade Assessment:                            III - A patient with severe systemic disease. After  reviewing the risks and benefits, the patient was                            deemed in satisfactory condition to undergo the                            procedure.                           After obtaining informed consent, the colonoscope                            was passed under direct vision. Throughout the   procedure, the patient's blood pressure, pulse, and                            oxygen saturations were monitored continuously. The                            [id number]-[id number] ([id number]) scope was introduced through                            the anus and advanced to the the cecum, identified                            by appendiceal orifice and ileocecal valve. The                            colonoscopy was performed without difficulty. The                            patient tolerated the procedure well. The quality                            of the bowel preparation was adequate to identify                            polyps. The ileocecal valve, appendiceal orifice,                            and rectum were photographed. Scope In: 10:06:33 AM Scope Out: 10:30:03 AM Scope Withdrawal Time: 0 hours 16 minutes 26 seconds  Total Procedure Duration: 0 hours 23 minutes 30 seconds  Findings:      The perianal and digital rectal examinations were normal.      Multiple small and large-mouthed diverticula were found in the entire       colon.      A diminutive polyp was found in the ascending colon. The polyp was       sessile. Biopsies were taken with a cold forceps for histology. The       pathology specimen was placed into Bottle Number 1.      A 4 mm polyp was found in the proximal sigmoid colon. The polyp was       removed with a cold snare. Resection and retrieval were complete. The  pathology specimen was placed into Bottle Number 1.      External hemorrhoids were found during retroflexion. The hemorrhoids       were small. Impression:               - Diverticulosis in the entire examined colon.                           - One diminutive polyp in the ascending colon.                            Biopsied.                           - One 4 mm polyp in the proximal sigmoid colon,                            removed with a cold snare. Resected and retrieved.                           - External  hemorrhoids. Moderate Sedation:      Moderate (conscious) sedation was administered by the endoscopy nurse       and supervised by the endoscopist. The following parameters were       monitored: oxygen saturation, heart rate, blood pressure, CO2       capnography and response to care. Total physician intraservice time was       28 minutes. Recommendation:           - Patient has a contact number available for                            emergencies. The signs and symptoms of potential                            delayed complications were discussed with the                            patient. Return to normal activities tomorrow.                            Written discharge instructions were provided to the                            patient.                           - High fiber diet and diabetic (ADA) diet today.                           - Continue present medications.                           - Resume aspirin at prior dose today.                           - Await pathology results.                           -  Repeat colonoscopy is recommended. The                            colonoscopy date will be determined after pathology                            results from today's exam become available for                            review. Procedure Code(s):        --- Professional ---                           [phone], Colonoscopy, flexible; with removal of                            tumor(s), polyp(s), or other lesion(s) by snare                            technique                           45380, 59, Colonoscopy, flexible; with biopsy,                            single or multiple                           99153, Moderate sedation; each additional 15                            minutes intraservice time                           G0500, Moderate sedation services provided by the                            same physician or other qualified health care                            professional  performing a gastrointestinal                            endoscopic service that sedation supports,                            requiring the presence of an independent trained                            observer to assist in the monitoring of the                            patient's level of consciousness and physiological                            status; initial 15 minutes of intra-service time;  patient age 75 years or older (additional time may                            be reported with [phone], as appropriate) Diagnosis Code(s):        --- Professional ---                           Z12.11, Encounter for screening for malignant                            neoplasm of colon                           K64.4, Residual hemorrhoidal skin tags                           K63.5, Polyp of colon                           K57.30, Diverticulosis of large intestine without                            perforation or abscess without bleeding CPT copyright 2019 [employer]. All rights reserved. The codes documented in this report are preliminary and upon coder review may  be revised to meet current compliance requirements. [name], MD [name], MD 06/27/2019 10:46:17 AM This report has been signed electronically. Number of Addenda: 0

## 2019-06-27 NOTE — H&P (Signed)
[name] is an 64 y.o. male.   Chief Complaint: Patient is here for colonoscopy. HPI: Patient is 64 year old Caucasian male who is here for screening colonoscopy.  Last exam was in April 2011.  He denies abdominal pain or rectal bleeding.  He has had some intermittent diarrhea felt to be due to his medications.  No history of weight loss.  He is status post CABG 3 months ago.  He states he has done well.  Last aspirin dose was 2 days ago.  He is on full dose. Mother was diagnosed with colon cancer at age 31.  She had declined screening.  She was also diagnosed with pancreatic cancer at the time of diagnosis of colon cancer.  Past Medical History:  Diagnosis Date  . CAD (coronary artery disease) 03/03/2019  . Hypertension   . Type 2 diabetes mellitus with diabetic nephropathy ([place]) 04/23/2019    Past Surgical History:  Procedure Laterality Date  . CORONARY ARTERY BYPASS GRAFT N/A 03/25/2019   Procedure: CORONARY ARTERY BYPASS GRAFTING (CABG) X  4 USING LEFT INTERNAL MAMMARY ARTERY AND RIGHT SAPHENOUS VEIN GRAFTS;  Surgeon: [name], MD;  Location: [facility];  Service: Open Heart Surgery;  Laterality: N/A;  . EXCISION [facility]OMA    . RIGHT/LEFT HEART CATH AND CORONARY ANGIOGRAPHY N/A 03/12/2019   Procedure: RIGHT/LEFT HEART CATH AND CORONARY ANGIOGRAPHY;  Surgeon: [name], MD;  Location: [facility];  Service: Cardiovascular;  Laterality: N/A;  . TEE WITHOUT CARDIOVERSION N/A 03/25/2019   Procedure: TRANSESOPHAGEAL ECHOCARDIOGRAM (TEE);  Surgeon: [name], MD;  Location: [facility];  Service: Open Heart Surgery;  Laterality: N/A;    Family History  Problem Relation Age of Onset  . Cancer Mother   . Heart Problems Father    Social History:  reports that he quit smoking about 30 years ago. He has never used smokeless tobacco. He reports previous alcohol use. He reports that he does not use drugs.  Allergies:  Allergies  Allergen Reactions   . Bee Venom Anaphylaxis    Medications Prior to Admission  Medication Sig Dispense Refill  . acetaminophen (TYLENOL) 500 MG tablet Take 1,000 mg by mouth every 6 (six) hours as needed for moderate pain or headache.    Marland Kitchen amLODipine (NORVASC) 5 MG tablet Take 1 tablet (5 mg total) by mouth daily. 90 tablet 3  . Ascorbic Acid (VITAMIN C) 1000 MG tablet Take 1,000 mg by mouth daily.    Marland Kitchen aspirin 325 MG EC tablet Take 1 tablet (325 mg total) by mouth daily. (Patient taking differently: Take 325 mg by mouth daily. ) 30 tablet 0  . [place]-Glucosamine-Vit D (OSTEO BI-FLEX ONE PER DAY PO) Take 1 tablet by mouth daily.    . carvedilol (COREG) 25 MG tablet Take 1 tablet (25 mg total) by mouth 2 (two) times daily with a meal. 60 tablet 1  . Cholecalciferol (VITAMIN D-3) 125 MCG (5000 UT) TABS Take 5,000 Units by mouth daily.    . Continuous Blood Gluc Sensor (FREESTYLE LIBRE 14 DAY SENSOR) MISC Inject 1 each into the skin every 14 (fourteen) days. Use as directed. 2 each 2  . Cyanocobalamin (B-12) 2500 MCG TABS Take 2,500 mcg by mouth daily.    . diphenhydramine-acetaminophen (TYLENOL PM) 25-500 MG TABS tablet Take 2 tablets by mouth at bedtime as needed (sleep).     . furosemide (LASIX) 40 MG tablet Take 1 tablet (40 mg total) by mouth daily as needed. (Patient  taking differently: Take 40 mg by mouth daily as needed (fluid retention.). ) 30 tablet 11  . glipiZIDE (GLUCOTROL) 5 MG tablet Take 1 tablet (5 mg total) by mouth 2 (two) times daily before a meal. 60 tablet 2  . hydrALAZINE (APRESOLINE) 100 MG tablet Take 1 tablet (100 mg total) by mouth 3 (three) times daily. 270 tablet 3  . Multiple Vitamin (MULTIVITAMIN WITH MINERALS) TABS tablet Take 1 tablet by mouth daily.    . prednisoLONE acetate (PRED FORTE) 1 % ophthalmic suspension Place 1 drop into the right eye 2 (two) times daily.    . rosuvastatin (CRESTOR) 10 MG tablet Take 1 tablet (10 mg total) by mouth daily. (Patient taking differently:  Take 10 mg by mouth every evening. ) 90 tablet 1  . Insulin Pen Needle (B-D ULTRAFINE III SHORT PEN) 31G X 8 MM MISC 1 each by Does not apply route as directed. 100 each 3    Results for orders placed or performed during the hospital encounter of 06/27/19 (from the past 48 hour(s))  Glucose, capillary     Status: Abnormal   Collection Time: 06/27/19  9:31 AM  Result Value Ref Range   Glucose-Capillary 116 (H) 70 - 99 mg/dL   No results found.  Review of Systems  Blood pressure (!) 180/98, pulse 84, temperature 97.7 F (36.5 C), temperature source Oral, resp. rate 16, height 5\' 10"  (1.778 m), SpO2 97 %. Physical Exam  Constitutional: He appears well-developed and well-nourished.  HENT:  Mouth/Throat: Oropharynx is clear and moist.  Eyes: Conjunctivae are normal. No scleral icterus.  Neck: No thyromegaly present.  Cardiovascular: Normal rate, regular rhythm and normal heart sounds.  No murmur heard. Respiratory: Effort normal and breath sounds normal.  Midsternal scar.  GI: Soft. He exhibits no distension and no mass. There is no abdominal tenderness.  Laparoscopy scars.  Musculoskeletal:        General: No edema.  Lymphadenopathy:    He has no cervical adenopathy.  Neurological: He is alert.  Skin: Skin is warm and dry.     Assessment/Plan Average risk screening colonoscopy. Mother had colon carcinoma at late onset i.e. at age 66.  [name], MD 06/27/2019, 9:58 AM

## 2019-06-28 LAB — SURGICAL PATHOLOGY

## 2019-07-08 ENCOUNTER — Other Ambulatory Visit: Payer: Self-pay

## 2019-07-08 ENCOUNTER — Encounter: Payer: Self-pay | Admitting: Nutrition

## 2019-07-08 ENCOUNTER — Encounter: Payer: Medicaid Other | Attending: "Endocrinology | Admitting: Nutrition

## 2019-07-08 VITALS — Ht 70.0 in | Wt 218.0 lb

## 2019-07-08 DIAGNOSIS — E1159 Type 2 diabetes mellitus with other circulatory complications: Secondary | ICD-10-CM | POA: Diagnosis not present

## 2019-07-08 DIAGNOSIS — N183 Chronic kidney disease, stage 3 unspecified: Secondary | ICD-10-CM | POA: Diagnosis not present

## 2019-07-08 DIAGNOSIS — I2510 Atherosclerotic heart disease of native coronary artery without angina pectoris: Secondary | ICD-10-CM | POA: Insufficient documentation

## 2019-07-08 DIAGNOSIS — Z951 Presence of aortocoronary bypass graft: Secondary | ICD-10-CM | POA: Diagnosis not present

## 2019-07-08 DIAGNOSIS — E669 Obesity, unspecified: Secondary | ICD-10-CM | POA: Insufficient documentation

## 2019-07-08 NOTE — Progress Notes (Signed)
  Medical Nutrition Therapy:  Appt start time: 0800 end time:  0900.   Assessment:  Primary concerns today: Diabetes Type 2, Obesity, CKD. Here with his wife. Sees Dr. Dorris Fetch, Endocrinology. Just had CABG x 4 3 months ago. Eats 3 meals per day. Watching portions and trying to work on better meal planning. FBS 112-130's. Taking 20 units of Lantus per day. NO metformin due to CKD. He is willing to work on [facility] control and increasing exercise as tolerated after his surgery. Drinks a lot of water. Lab Results  Component Value Date   HGBA1C 8.2 (H) 03/25/2019   CMP Latest Ref Rng & Units 05/27/2019 05/13/2019 04/23/2019  Glucose 70 - 99 mg/dL 218(H) 153(H) 144(H)  BUN 8 - 23 mg/dL 37(H) 24 23  Creatinine 0.61 - 1.24 mg/dL 2.22(H) 1.94(H) 1.86(H)  Sodium 135 - 145 mmol/L 130(L) 138 139  Potassium 3.5 - 5.1 mmol/L 3.9 4.2 4.5  Chloride 98 - 111 mmol/L 98 104 99  CO2 22 - 32 mmol/L 23 25 22   Calcium 8.9 - 10.3 mg/dL 9.0 9.9 9.9  Total Protein 6.5 - 8.1 g/dL - - -  Total Bilirubin 0.3 - 1.2 mg/dL - - -  Alkaline Phos 38 - 126 U/L - - -  AST 15 - 41 U/L - - -  ALT 0 - 44 U/L - - -   Lipid Panel     Component Value Date/Time   CHOL 152 12/31/2018 0958   TRIG 172 (H) 12/31/2018 0958   HDL 41 12/31/2018 0958   CHOLHDL 3.9 04/26/2018 1011   VLDL 20 04/26/2018 1011   LDLCALC 77 12/31/2018 0958   LABVLDL 34 12/31/2018 0958     Preferred Learning Style:  No preference indicated   Learning Readiness:   Ready  Change in progress   MEDICATIONS:    DIETARY INTAKE:  Eats 3 meals per day. Drinks a lot of water. Avoid salty foods for the most part.  Usual physical activity: ADL, walks some.   Estimated energy needs: 1800  calories 200 g carbohydrates 135 g protein 50 g fat  Progress Towards Goal(s):  In progress.   Nutritional Diagnosis:  NB-1.1 Food and nutrition-related knowledge deficit As related to Diabetes Type 2.  As evidenced by A1C 8.2%.    Intervention:  Nutrition  and Diabetes education provided on My Plate, CHO counting, meal planning, portion sizes, timing of meals, avoiding snacks between meals unless having a low blood sugar, target ranges for A1C and blood sugars, signs/symptoms and treatment of hyper/hypoglycemia, monitoring blood sugars, taking medications as prescribed, benefits of exercising 30 minutes per day and prevention of complications of DM.  Goals' Follow My Plate  Eat meals on time Watch portion sizes Eat 45-60 grams of carbs per meal. Increase exercise as allowed. Use herbs and spices. Lose 2-3 lbs per month. Get A1C 7% or less.   Teaching Method Utilized:   Visual Auditory Hands on  Handouts given during visit include:  The Plate Method   Meal Plan Card  Diabetes Instrucitons.   Barriers to learning/adherence to lifestyle change: none  Demonstrated degree of understanding via:  Teach Back   Monitoring/Evaluation:  Dietary intake, exercise, and body weight in 2 month(s).

## 2019-07-08 NOTE — Patient Instructions (Signed)
Goals' Follow My Plate  Eat meals on time Watch portion sizes Eat 45-60 grams of carbs per meal. Increase exercise as allowed. Use herbs and spices. Lose 2-3 lbs per month. Get A1C 7% or less.

## 2019-07-09 ENCOUNTER — Ambulatory Visit: Payer: Medicaid Other | Admitting: Student

## 2019-07-09 ENCOUNTER — Other Ambulatory Visit: Payer: Self-pay | Admitting: Physician Assistant

## 2019-07-10 DIAGNOSIS — Z736 Limitation of activities due to disability: Secondary | ICD-10-CM

## 2019-07-22 DIAGNOSIS — E1122 Type 2 diabetes mellitus with diabetic chronic kidney disease: Secondary | ICD-10-CM | POA: Diagnosis not present

## 2019-07-22 DIAGNOSIS — N1832 Chronic kidney disease, stage 3b: Secondary | ICD-10-CM | POA: Diagnosis not present

## 2019-07-22 DIAGNOSIS — I129 Hypertensive chronic kidney disease with stage 1 through stage 4 chronic kidney disease, or unspecified chronic kidney disease: Secondary | ICD-10-CM | POA: Diagnosis not present

## 2019-08-05 ENCOUNTER — Other Ambulatory Visit: Payer: Self-pay

## 2019-08-05 ENCOUNTER — Encounter (HOSPITAL_COMMUNITY): Payer: Self-pay

## 2019-08-05 ENCOUNTER — Encounter (HOSPITAL_COMMUNITY)
Admission: RE | Admit: 2019-08-05 | Discharge: 2019-08-05 | Disposition: A | Discharge status: Home / Self Care | Payer: Medicaid Other | Source: Ambulatory Visit | Attending: Nephrology | Admitting: Nephrology

## 2019-08-05 DIAGNOSIS — N189 Chronic kidney disease, unspecified: Secondary | ICD-10-CM | POA: Diagnosis not present

## 2019-08-05 DIAGNOSIS — D631 Anemia in chronic kidney disease: Secondary | ICD-10-CM | POA: Insufficient documentation

## 2019-08-05 MED ORDER — SODIUM CHLORIDE 0.9 % IV SOLN: Freq: Once | INTRAVENOUS | Status: AC

## 2019-08-05 MED ORDER — SODIUM CHLORIDE 0.9 % IV SOLN
510.0000 mg | Freq: Once | INTRAVENOUS | Status: AC
  Administered 2019-08-05: 12:00:00 510 mg via INTRAVENOUS
  Filled 2019-08-05: qty 17

## 2019-08-12 ENCOUNTER — Encounter (HOSPITAL_COMMUNITY)
Admission: RE | Admit: 2019-08-12 | Discharge: 2019-08-12 | Disposition: A | Discharge status: Home / Self Care | Payer: Medicaid Other | Source: Ambulatory Visit | Attending: Nephrology | Admitting: Nephrology

## 2019-08-12 ENCOUNTER — Encounter (HOSPITAL_COMMUNITY): Payer: Self-pay

## 2019-08-12 ENCOUNTER — Other Ambulatory Visit: Payer: Self-pay

## 2019-08-12 DIAGNOSIS — N189 Chronic kidney disease, unspecified: Secondary | ICD-10-CM | POA: Diagnosis not present

## 2019-08-12 DIAGNOSIS — D631 Anemia in chronic kidney disease: Secondary | ICD-10-CM | POA: Diagnosis not present

## 2019-08-12 MED ORDER — SODIUM CHLORIDE 0.9 % IV SOLN
510.0000 mg | Freq: Once | INTRAVENOUS | Status: AC
  Administered 2019-08-12: 510 mg via INTRAVENOUS
  Filled 2019-08-12: qty 510

## 2019-08-12 MED ORDER — SODIUM CHLORIDE 0.9 % IV SOLN: Freq: Once | INTRAVENOUS | Status: AC

## 2019-08-19 ENCOUNTER — Encounter: Payer: Self-pay | Admitting: "Endocrinology

## 2019-08-19 ENCOUNTER — Ambulatory Visit (INDEPENDENT_AMBULATORY_CARE_PROVIDER_SITE_OTHER): Payer: Medicaid Other | Admitting: "Endocrinology

## 2019-08-19 ENCOUNTER — Other Ambulatory Visit: Payer: Self-pay

## 2019-08-19 ENCOUNTER — Encounter: Payer: Medicaid Other | Attending: "Endocrinology | Admitting: Nutrition

## 2019-08-19 VITALS — BP 157/78 | HR 76 | Ht 70.0 in | Wt 227.8 lb

## 2019-08-19 DIAGNOSIS — N184 Chronic kidney disease, stage 4 (severe): Secondary | ICD-10-CM | POA: Insufficient documentation

## 2019-08-19 DIAGNOSIS — E669 Obesity, unspecified: Secondary | ICD-10-CM | POA: Diagnosis not present

## 2019-08-19 DIAGNOSIS — Z951 Presence of aortocoronary bypass graft: Secondary | ICD-10-CM | POA: Insufficient documentation

## 2019-08-19 DIAGNOSIS — E782 Mixed hyperlipidemia: Secondary | ICD-10-CM | POA: Diagnosis not present

## 2019-08-19 DIAGNOSIS — I2510 Atherosclerotic heart disease of native coronary artery without angina pectoris: Secondary | ICD-10-CM | POA: Diagnosis not present

## 2019-08-19 DIAGNOSIS — E1159 Type 2 diabetes mellitus with other circulatory complications: Secondary | ICD-10-CM | POA: Diagnosis not present

## 2019-08-19 DIAGNOSIS — I10 Essential (primary) hypertension: Secondary | ICD-10-CM | POA: Diagnosis not present

## 2019-08-19 LAB — POCT GLYCOSYLATED HEMOGLOBIN (HGB A1C): Hemoglobin A1C: 7.8 % — AB (ref 4.0–5.6)

## 2019-08-19 MED ORDER — LISINOPRIL-HYDROCHLOROTHIAZIDE 20-25 MG PO TABS: 1.0000 | ORAL_TABLET | Freq: Every day | ORAL | 3 refills | Status: DC

## 2019-08-19 NOTE — Progress Notes (Signed)
08/19/2019, 9:45 AM  Endocrinology follow-up note   Subjective:    Patient ID: [name], male    DOB: 1956-01-23.  Elisah Parmer Blankenbeckler is being seen in follow-up after he was seen in consultation for management of currently uncontrolled symptomatic diabetes requested by  [name], MD.   Past Medical History:  Diagnosis Date  . CAD (coronary artery disease) 03/03/2019  . Hypertension   . Type 2 diabetes mellitus with diabetic nephropathy ([place]) 04/23/2019    Past Surgical History:  Procedure Laterality Date  . BIOPSY  06/27/2019   Procedure: BIOPSY;  Surgeon: Rogene Houston, MD;  [facility];  Service: Endoscopy;;  ascending colon polyp  . COLONOSCOPY N/A 06/27/2019   Procedure: COLONOSCOPY;  Surgeon: Rogene Houston, MD;  [facility];  Service: Endoscopy;  Laterality: N/A;  1030am-office rescheduled to 06/27/19 @9 :55am  . CORONARY ARTERY BYPASS GRAFT N/A 03/25/2019   Procedure: CORONARY ARTERY BYPASS GRAFTING (CABG) X  4 USING LEFT INTERNAL MAMMARY ARTERY AND RIGHT SAPHENOUS VEIN GRAFTS;  Surgeon: [name], MD;  Location: [facility];  Service: Open Heart Surgery;  Laterality: N/A;  . EXCISION MORTON'S NEUROMA    . POLYPECTOMY  06/27/2019   Procedure: POLYPECTOMY;  Surgeon: Rogene Houston, MD;  [facility];  Service: Endoscopy;;  proximal sigmoid colon  . RIGHT/LEFT HEART CATH AND CORONARY ANGIOGRAPHY N/A 03/12/2019   Procedure: RIGHT/LEFT HEART CATH AND CORONARY ANGIOGRAPHY;  Surgeon: [name], MD;  Location: [facility];  Service: Cardiovascular;  Laterality: N/A;  . TEE WITHOUT CARDIOVERSION N/A 03/25/2019   Procedure: TRANSESOPHAGEAL ECHOCARDIOGRAM (TEE);  Surgeon: [name], MD;  Location: [facility];  Service: Open Heart Surgery;  Laterality: N/A;    Social History   Socioeconomic History  . Marital status: Single     Spouse name: Not on file  . Number of children: Not on file  . Years of education: Not on file  . Highest education level: Not on file  Occupational History  . Not on file  Tobacco Use  . Smoking status: Former Smoker    Quit date: 07/06/1988    Years since quitting: 31.1  . Smokeless tobacco: Never Used  Substance and Sexual Activity  . Alcohol use: Not Currently    Comment: socially  . Drug use: No  . Sexual activity: Not on file  Other Topics Concern  . Not on file  Social History Narrative  . Not on file   Social Determinants of Health   Financial Resource Strain:   . Difficulty of Paying Living Expenses: Not on file  Food Insecurity:   . Worried About Charity fundraiser in the Last Year: Not on file  . Ran Out of Food in the Last Year: Not on file  Transportation Needs:   . Lack of Transportation (Medical): Not on file  . Lack of Transportation (Non-Medical): Not on file  Physical Activity:   . Days of Exercise per Week: Not on file  . Minutes of Exercise per Session: Not on file  Stress:   . Feeling of Stress : Not on file  Social Connections:   .  Frequency of Communication with Friends and Family: Not on file  . Frequency of Social Gatherings with Friends and Family: Not on file  . Attends Religious Services: Not on file  . Active Member of Clubs or Organizations: Not on file  . Attends Archivist Meetings: Not on file  . Marital Status: Not on file    Family History  Problem Relation Age of Onset  . Cancer Mother   . Heart Problems Father     Outpatient Encounter Medications as of 08/19/2019  Medication Sig  . acetaminophen (TYLENOL) 500 MG tablet Take 1,000 mg by mouth every 6 (six) hours as needed for moderate pain or headache.  . Ascorbic Acid (VITAMIN C) 1000 MG tablet Take 1,000 mg by mouth daily.  Marland Kitchen aspirin 325 MG EC tablet Take 1 tablet (325 mg total) by mouth daily. (Patient taking differently: Take 325 mg by mouth daily. )  .  Boswellia-Glucosamine-Vit D (OSTEO BI-FLEX ONE PER DAY PO) Take 1 tablet by mouth daily.  . carvedilol (COREG) 25 MG tablet TAKE ONE TABLET (25MG  TOTAL) BY MOUTH TWO TIMES DAILY WITH A MEAL  . Cholecalciferol (VITAMIN D-3) 125 MCG (5000 UT) TABS Take 5,000 Units by mouth daily.  . Continuous Blood Gluc Sensor (FREESTYLE LIBRE 14 DAY SENSOR) MISC Inject 1 each into the skin every 14 (fourteen) days. Use as directed.  . Cyanocobalamin (B-12) 2500 MCG TABS Take 2,500 mcg by mouth daily.  . diphenhydramine-acetaminophen (TYLENOL PM) 25-500 MG TABS tablet Take 2 tablets by mouth at bedtime as needed (sleep).   . furosemide (LASIX) 40 MG tablet Take 1 tablet (40 mg total) by mouth daily as needed. (Patient taking differently: Take 40 mg by mouth daily as needed (fluid retention.). )  . glipiZIDE (GLUCOTROL) 5 MG tablet Take 1 tablet (5 mg total) by mouth 2 (two) times daily before a meal.  . hydrALAZINE (APRESOLINE) 100 MG tablet Take 1 tablet (100 mg total) by mouth 3 (three) times daily.  . Insulin Pen Needle (B-D ULTRAFINE III SHORT PEN) 31G X 8 MM MISC 1 each by Does not apply route as directed.  Marland Kitchen lisinopril-hydrochlorothiazide (ZESTORETIC) 20-25 MG tablet Take 1 tablet by mouth daily.  . Multiple Vitamin (MULTIVITAMIN WITH MINERALS) TABS tablet Take 1 tablet by mouth daily.  . prednisoLONE acetate (PRED FORTE) 1 % ophthalmic suspension Place 1 drop into the right eye 2 (two) times daily.  . rosuvastatin (CRESTOR) 10 MG tablet Take 1 tablet (10 mg total) by mouth daily. (Patient taking differently: Take 10 mg by mouth every evening. )  . [DISCONTINUED] amLODipine (NORVASC) 5 MG tablet Take 1 tablet (5 mg total) by mouth daily.   No facility-administered encounter medications on file as of 08/19/2019.    ALLERGIES: Allergies  Allergen Reactions  . Bee Venom Anaphylaxis    VACCINATION STATUS: Immunization History  Administered Date(s) Administered  . Pneumococcal Polysaccharide-23 07/16/2018   . Zoster Recombinat (Shingrix) 04/25/2019    Diabetes He presents for his follow-up diabetic visit. He has type 2 diabetes mellitus. Onset time: He was diagnosed at approximate age of 56 years. His disease course has been improving. There are no hypoglycemic associated symptoms. Pertinent negatives for hypoglycemia include no confusion, headaches, pallor or seizures. Pertinent negatives for diabetes include no chest pain, no fatigue, no polydipsia, no polyphagia, no polyuria and no weakness. There are no hypoglycemic complications. Symptoms are improving. Diabetic complications include heart disease and nephropathy. (Patient is a status post quadruple bypass surgery in October  2019.) Risk factors for coronary artery disease include dyslipidemia, diabetes mellitus, family history, obesity, hypertension, male sex, tobacco exposure and sedentary lifestyle. Current diabetic treatments: Is currently on glipizide 10 mg p.o. twice daily. His weight is increasing steadily. He is following a generally unhealthy diet. When asked about meal planning, he reported none. He has had a previous visit with a dietitian. He participates in exercise intermittently. His home blood glucose trend is increasing steadily. His breakfast blood glucose range is generally 130-140 mg/dl. His bedtime blood glucose range is generally 140-180 mg/dl. His overall blood glucose range is 140-180 mg/dl. (He presents with controlled fasting glycemic profile, and A1c was 7.8% on point-of-care improving from 8.2%.  He has no major hypoglycemic episodes.  ) An ACE inhibitor/angiotensin II receptor blocker is being taken. Eye exam is current.  Hyperlipidemia This is a chronic problem. The current episode started more than 1 year ago. The problem is controlled. Exacerbating diseases include chronic renal disease, diabetes and obesity. Pertinent negatives include no chest pain, myalgias or shortness of breath. Current antihyperlipidemic treatment  includes statins. Risk factors for coronary artery disease include family history, dyslipidemia, diabetes mellitus, hypertension, male sex, obesity and a sedentary lifestyle.  Hypertension This is a chronic problem. The current episode started more than 1 year ago. The problem is uncontrolled. Pertinent negatives include no chest pain, headaches, neck pain, palpitations or shortness of breath. Risk factors for coronary artery disease include diabetes mellitus, dyslipidemia and male gender. Past treatments include angiotensin blockers. Hypertensive end-organ damage includes kidney disease and CAD/MI. Identifiable causes of hypertension include chronic renal disease.     Review of systems  Constitutional: + Minimally fluctuating body weight,  current  Body mass index is 32.69 kg/m. , no fatigue, no subjective hyperthermia, no subjective hypothermia Eyes: no blurry vision, no xerophthalmia ENT: no sore throat, no nodules palpated in throat, no dysphagia/odynophagia, no hoarseness Cardiovascular: no Chest Pain, no Shortness of Breath, no palpitations, no leg swelling Respiratory: no cough, no shortness of breath Gastrointestinal: no Nausea/Vomiting/Diarhhea Musculoskeletal: no muscle/joint aches Skin: no rashes, no hyperemia Neurological: no tremors, no numbness, no tingling, no dizziness Psychiatric: no depression, no anxiety   Objective:    BP (!) 157/78   Pulse 76   Ht 5\' 10"  (1.778 m)   Wt 227 lb 12.8 oz (103.3 kg)   BMI 32.69 kg/m   Wt Readings from Last 3 Encounters:  08/19/19 227 lb 12.8 oz (103.3 kg)  08/05/19 218 lb 0.6 oz (98.9 kg)  07/08/19 218 lb (98.9 kg)      Physical Exam- Limited  Constitutional:  Body mass index is 32.69 kg/m. , not in acute distress, normal state of mind Eyes:  EOMI, no exophthalmos Neck: Supple Thyroid: No gross goiter Respiratory: Adequate breathing efforts Musculoskeletal: no gross deformities, strength intact in all four extremities, no  gross restriction of joint movements Skin:  no rashes, no hyperemia Neurological: no tremor with outstretched hands,   CMP ( most recent) CMP     Component Value Date/Time   NA 130 (L) 05/27/2019 1445   NA 139 04/23/2019 1358   K 3.9 05/27/2019 1445   CL 98 05/27/2019 1445   CO2 23 05/27/2019 1445   GLUCOSE 218 (H) 05/27/2019 1445   BUN 37 (H) 05/27/2019 1445   BUN 23 04/23/2019 1358   CREATININE 2.22 (H) 05/27/2019 1445   CREATININE 1.94 (H) 05/13/2019 1029   CALCIUM 9.0 05/27/2019 1445   PROT 6.7 03/21/2019 1526   ALBUMIN 3.9  03/21/2019 1526   AST 19 03/21/2019 1526   ALT 20 03/21/2019 1526   ALKPHOS 56 03/21/2019 1526   BILITOT 0.4 03/21/2019 1526   GFRNONAA 31 (L) 05/27/2019 1445   GFRAA 35 (L) 05/27/2019 1445     Diabetic Labs (most recent): Lab Results  Component Value Date   HGBA1C 7.8 (A) 08/19/2019   HGBA1C 8.2 (H) 03/25/2019   HGBA1C 6.8 (A) 03/18/2019    Lipid Panel     Component Value Date/Time   CHOL 152 12/31/2018 0958   TRIG 172 (H) 12/31/2018 0958   HDL 41 12/31/2018 0958   CHOLHDL 3.9 04/26/2018 1011   VLDL 20 04/26/2018 1011   LDLCALC 77 12/31/2018 0958   LABVLDL 34 12/31/2018 0958      Lab Results  Component Value Date   TSH 2.110 04/29/2018      Assessment & Plan:   1. DM type 2 causing vascular disease ([place])  - AYDON SWAMY has currently uncontrolled symptomatic type 2 DM since  64 years of age.  He presents with controlled fasting glycemic profile, and A1c was 7.8% on point-of-care improving from 8.2%.  He has no major hypoglycemic episodes.  - I had a long discussion with him about the progressive nature of diabetes and the pathology behind its complications. -his diabetes is complicated by coronary artery disease, CKD and he remains at a high risk for more acute and chronic complications which include CAD, CVA, CKD, retinopathy, and neuropathy. These are all discussed in detail with him.  - I have counseled him on diet   and weight management  by adopting a carbohydrate restricted/protein rich diet. Patient is encouraged to switch to  unprocessed or minimally processed     complex starch and increased protein intake (animal or plant source), fruits, and vegetables. -  he is advised to stick to a routine mealtimes to eat 3 meals  a day and avoid unnecessary snacks ( to snack only to correct hypoglycemia).   - he  admits there is a room for improvement in his diet and drink choices. -  Suggestion is made for him to avoid simple carbohydrates  from his diet including Cakes, Sweet Desserts / Pastries, Ice Cream, Soda (diet and regular), Sweet Tea, Candies, Chips, Cookies, Sweet Pastries,  Store Bought Juices, Alcohol in Excess of  1-2 drinks a day, Artificial Sweeteners, Coffee Creamer, and "Sugar-free" Products. This will help patient to have stable blood glucose profile and potentially avoid unintended weight gain.   - he will be scheduled with Jearld Fenton, RDN, CDE for diabetes education.  - I have approached him with the following individualized plan to manage  his diabetes and patient agrees:   -Based on his presentation with controlled fasting glycemic profile, and A1c improved to 7.8%, he will not need prandial insulin for now.    -He is advised to continue Lantus 20 units daily.   He prefers to taking Lantus in the morning, advised to continue Lantus 20 units daily with breakfast between 8 and 9 AM, continue to monitor blood glucose twice a day-daily before breakfast and at bedtime.  - he is encouraged to call clinic for blood glucose levels less than 70 or above 200 mg /dl. - he is advised to continue glipizide 5 mg p.o. twice daily with breakfast and supper.    - he is not a candidate for Metformin, SGLT2 inhibitors due to concurrent renal insufficiency.  - he will be considered for incretin therapy as appropriate  next visit.  - Specific targets for  A1c;  LDL, HDL, Triglycerides, and  Waist  Circumference were discussed with the patient.  2) Blood Pressure /Hypertension: His blood pressure is not controlled.  I discussed and made some changes.  Advised him to discontinue multiple in light of his ongoing problem with peripheral edema.  I discussed and added HCTZ/lisinopril 25/20 mg p.o. daily at breakfast along with his hydralazine 100 mg p.o. 3 times daily and Coreg 25 mg p.o. twice daily.   3) Lipids/Hyperlipidemia:   Review of his recent lipid panel showed  controlled  LDL at 77 .  he is advised to continue rosuvastatin 10 mg p.o. nightly.  Side effects and precautions discussed with him.  4)  Weight/Diet: His BMI is 31-   clearly complicating his diabetes care.   he is  a candidate for weight loss. I discussed with him the fact that loss of 5 - 10% of his  current body weight will have the most impact on his diabetes management.  Exercise, and detailed carbohydrates information provided  -  detailed on discharge instructions.  5) Chronic Care/Health Maintenance:  -he  is on ACEI/ARB and Statin medications and  is encouraged to initiate and continue to follow up with Ophthalmology, Dentist,  Podiatrist at least yearly or according to recommendations, and advised to  stay away from smoking. I have recommended yearly flu vaccine and pneumonia vaccine at least every 5 years; moderate intensity exercise for up to 150 minutes weekly; and  sleep for at least 7 hours a day.  - he is  advised to maintain close follow up with [name], MD for primary care needs, as well as his other providers for optimal and coordinated care.  - Time spent on this patient care encounter:  35 min, of which > 50% was spent in  counseling and the rest reviewing his blood glucose logs , discussing his hypoglycemia and hyperglycemia episodes, reviewing his current and  previous labs / studies  ( including abstraction from other facilities) and medications  doses and developing a  long term treatment plan  and documenting his care.   Please refer to Patient Instructions for Blood Glucose Monitoring and Insulin/Medications Dosing Guide"  in media tab for additional information. Please  also refer to " Patient Self Inventory" in the Media  tab for reviewed elements of pertinent patient history.  Reita Cliche Sleep participated in the discussions, expressed understanding, and voiced agreement with the above plans.  All questions were answered to his satisfaction. he is encouraged to contact clinic should he have any questions or concerns prior to his return visit.   Follow up plan: - Return in about 4 months (around 12/19/2019) for Bring Meter and Logs- A1c in Office, Follow up with Pre-visit Labs.  Glade Lloyd, MD [facility] [address] [place], [place] [address] Phone: [phone]  Fax: [phone]    08/19/2019, 9:45 AM  This note was partially dictated with voice recognition software. Similar sounding words can be transcribed inadequately or may not  be corrected upon review.

## 2019-08-19 NOTE — Progress Notes (Signed)
  Medical Nutrition Therapy:  Appt start time: 0930 end time:  1000  Assessment:  Primary concerns today: Diabetes Type 2, Obesity, CKD. Here with his wife. Sees Dr. Dorris Fetch, Endocrinology. Wt is up 9 lbs. See Cardiology.  Glipizide 5 mg a day, Lantus 20 units. BS 140-160's in am. Bedtime 160-200's. Likes using [employer]. Working on less processed foods and eating more lower carb vegetables. CKD-sees Dr for that. Trying to avoid foods high in potassium and magnesium A1C 7.8%.   Lab Results  Component Value Date   HGBA1C 7.8 (A) 08/19/2019   CMP Latest Ref Rng & Units 05/27/2019 05/13/2019 04/23/2019  Glucose 70 - 99 mg/dL 218(H) 153(H) 144(H)  BUN 8 - 23 mg/dL 37(H) 24 23  Creatinine 0.61 - 1.24 mg/dL 2.22(H) 1.94(H) 1.86(H)  Sodium 135 - 145 mmol/L 130(L) 138 139  Potassium 3.5 - 5.1 mmol/L 3.9 4.2 4.5  Chloride 98 - 111 mmol/L 98 104 99  CO2 22 - 32 mmol/L 23 25 22   Calcium 8.9 - 10.3 mg/dL 9.0 9.9 9.9  Total Protein 6.5 - 8.1 g/dL - - -  Total Bilirubin 0.3 - 1.2 mg/dL - - -  Alkaline Phos 38 - 126 U/L - - -  AST 15 - 41 U/L - - -  ALT 0 - 44 U/L - - -   Lipid Panel     Component Value Date/Time   CHOL 152 12/31/2018 0958   TRIG 172 (H) 12/31/2018 0958   HDL 41 12/31/2018 0958   CHOLHDL 3.9 04/26/2018 1011   VLDL 20 04/26/2018 1011   LDLCALC 77 12/31/2018 0958   LABVLDL 34 12/31/2018 0958     Preferred Learning Style:  No preference indicated   Learning Readiness:   Ready  Change in progress   MEDICATIONS:    DIETARY INTAKE:  Eats 3 meals per day. Drinks a lot of water. Avoid salty foods for the most part.  Usual physical activity: ADL, walks some.   Estimated energy needs: 1800  calories 200 g carbohydrates 135 g protein 50 g fat  Progress Towards Goal(s):  In progress.   Nutritional Diagnosis:  NB-1.1 Food and nutrition-related knowledge deficit As related to Diabetes Type 2.  As evidenced by A1C 8.2%.    Intervention:  Nutrition and Diabetes  education provided on My Plate, CHO counting, meal planning, portion sizes, timing of meals, avoiding snacks between meals unless having a low blood sugar, target ranges for A1C and blood sugars, signs/symptoms and treatment of hyper/hypoglycemia, monitoring blood sugars, taking medications as prescribed, benefits of exercising 30 minutes per day and prevention of complications of DM. Diet for CKD.  Goals' Follow My Plate  Eat meals on time Avoid excessive protein and high potassium and phosphorus foods. Watch portion sizes Eat 45-60 grams of carbs per meal. Increase exercise as allowed. Use herbs and spices. Lose 2-3 lbs per month. Get A1C 7% or less.   Teaching Method Utilized:   Visual Auditory Hands on  Handouts given during visit include:  The Plate Method   Meal Plan Card  Diabetes Instrucitons.   Barriers to learning/adherence to lifestyle change: none  Demonstrated degree of understanding via:  Teach Back   Monitoring/Evaluation:  Dietary intake, exercise, and body weight in 2 month(s).

## 2019-08-19 NOTE — Patient Instructions (Signed)

## 2019-08-21 ENCOUNTER — Ambulatory Visit: Payer: Medicaid Other | Admitting: Family Medicine

## 2019-08-21 ENCOUNTER — Ambulatory Visit (HOSPITAL_COMMUNITY)
Admission: RE | Admit: 2019-08-21 | Discharge: 2019-08-21 | Disposition: A | Discharge status: Home / Self Care | Payer: Medicaid Other | Source: Ambulatory Visit | Attending: Family Medicine | Admitting: Family Medicine

## 2019-08-21 ENCOUNTER — Telehealth: Payer: Self-pay | Admitting: Family Medicine

## 2019-08-21 ENCOUNTER — Other Ambulatory Visit: Payer: Self-pay

## 2019-08-21 VITALS — BP 130/76 | Temp 97.9°F | Wt 230.0 lb

## 2019-08-21 DIAGNOSIS — M25511 Pain in right shoulder: Secondary | ICD-10-CM

## 2019-08-21 DIAGNOSIS — R600 Localized edema: Secondary | ICD-10-CM | POA: Diagnosis not present

## 2019-08-21 DIAGNOSIS — M19011 Primary osteoarthritis, right shoulder: Secondary | ICD-10-CM | POA: Diagnosis not present

## 2019-08-21 DIAGNOSIS — R400 Somnolence: Secondary | ICD-10-CM | POA: Diagnosis not present

## 2019-08-21 DIAGNOSIS — M25512 Pain in left shoulder: Secondary | ICD-10-CM

## 2019-08-21 DIAGNOSIS — D509 Iron deficiency anemia, unspecified: Secondary | ICD-10-CM

## 2019-08-21 DIAGNOSIS — I951 Orthostatic hypotension: Secondary | ICD-10-CM

## 2019-08-21 DIAGNOSIS — M19012 Primary osteoarthritis, left shoulder: Secondary | ICD-10-CM

## 2019-08-21 DIAGNOSIS — G8929 Other chronic pain: Secondary | ICD-10-CM | POA: Diagnosis not present

## 2019-08-21 DIAGNOSIS — F321 Major depressive disorder, single episode, moderate: Secondary | ICD-10-CM | POA: Diagnosis not present

## 2019-08-21 DIAGNOSIS — R5383 Other fatigue: Secondary | ICD-10-CM

## 2019-08-21 MED ORDER — SERTRALINE HCL 50 MG PO TABS: 50.0000 mg | ORAL_TABLET | Freq: Every day | ORAL | 3 refills | Status: DC

## 2019-08-21 NOTE — Progress Notes (Signed)
Subjective:    Patient ID: [name], male    DOB: [birth year], 64 y.o.   MRN: [id number]  Hypertension This is a chronic problem. The current episode started more than 1 year ago. Risk factors for coronary artery disease include diabetes mellitus, dyslipidemia and male gender. Treatments tried: norvasc, lasix. There are no compliance problems.    Patient would like to discuss fatigue and muscle aches. Iron deficiency anemia, unspecified iron deficiency anemia type - Plan: CBC with Differential/Platelet  Arthritis of both acromioclavicular joints - Plan: Sedimentation rate, C-reactive protein, CK (Creatine Kinase), CBC with Differential/Platelet  Orthostatic hypotension  Other fatigue  Daytime somnolence  Pedal edema  Chronic pain of both shoulders - Plan: DG Shoulder Left, DG Shoulder Right  Depression, major, single episode, moderate ([place])  Patient be noted that this patient states he gets dizzy when he stands up feels like he is going to pass out at times he also relates severe fatigue tiredness feeling tired during the day sleepy during the day in addition to this swelling in the legs and also gets out of energy and out of breath with minimal activity.  Unable to do much throughout the course of the day patient finds himself depressed because of his declining health denies being suicidal. Soc security Patient denies wanting to hurt himself but sometimes thank that things will be better off if he was dead but has no plans to hurt himself Review of Systems     Objective:   Physical Exam Lungs are clear respiratory rate normal heart regular extremities with 1-2+ edema in the lower legs restricted range of motion of the shoulders with probable AC joint arthritis.       Assessment & Plan:  1. Iron deficiency anemia, unspecified iron deficiency anemia type Patient relates iron deficient anemia been treated a couple times with iron infusion by the kidney doctor we will try  to get the records from this so we can look over those labs I doubt that it is a GI loss given his negative colonoscopy but it is possible I think it is reasonable to do stool test for blood - CBC with Differential/Platelet  2. Arthritis of both acromioclavicular joints This patient has significant shoulder arthritis as well as restricted motion and limited range of motion associated with the arthritis I believe it is AC arthritis along with some rotator cuff strain range of motion exercises will help a little but I believe that this arthritis is permanent - Sedimentation rate - C-reactive protein - CK (Creatine Kinase) - CBC with Differential/Platelet  3. Orthostatic hypotension Patient does have a drop in blood pressure with standing this is related to his heart medications he was cautioned against possibility of passing out  4. Other fatigue Significant fatigue issues very important for patient eat healthy try to stay active I believe his fatigue is multifactorial I think it is partly related to the medications he is on.  Also related to cardiomyopathy.  And also related to age.  Patient is debilitated  5. Daytime somnolence Patient does have daytime somnolence.  We will look at lab work first but if lab work is not revealing I think reasonable to do sleep study as well.  6. Pedal edema Significant pedal edema associated with chronic kidney disease.  On diuretics which unfortunately increases fatigue and orthostasis  7. Chronic pain of both shoulders We will go ahead and do x-rays of bilateral shoulders to look at [facility] joints - DG Shoulder  Left - DG Shoulder Right Major depression-go ahead with sertraline follow-up again in approximately 4 to 6 weeks to see how things go and side effects were discussed  In my opinion with the combination of this patient's age as well as his health issues including cardiomyopathy severe diabetes significant chronic kidney disease as well as  retinopathy of the eyes and also peripheral artery disease plus also pedal edema plus also fatigue and tiredness related to these conditions plus medications I believe this patient is disabled I do not feel he can maintain a regular job he would require 2 any days off and too many breaks in the schedule to accommodate for him.  Not capable of working a full-time job regardless of the job situation.  This is permanent.  I do not feel the patient is trying to get disabled I believe he would desire to work but unable to do so because of all the above

## 2019-08-21 NOTE — Telephone Encounter (Signed)
Patient is requesting a letter stating he is disable to carry to their tax person.Please advise

## 2019-08-21 NOTE — Progress Notes (Signed)
Cardiology Office Note    Date:  08/26/2019   ID:  [name], DOB [birth year], MRN [id number]  PCP:  [name], MD  Cardiologist: [name], MD EPS: None  No chief complaint on file.   History of Present Illness:  [name] is a 64 y.o. male with history of hypertension, diabetes, HLD, CKD stage III creatinine 2.05 03/29/2019.  CAD status post CABG 03/25/2019 with LIMA to the LAD, SVG to PLV, OM2 and diagonal 1.  TEE Intra-Op normal LVEF 50 to 55% with mild LVH and mild mitral and tricuspid regurgitation.  Patient saw [name] 04/05/2019 and was doing well and going to do cardiac rehab.  Patient later called in because of ongoing leg swelling and [name] ordered arterial ABIs.  There was abnormal arterial waveforms of the left ankle despite normal ABI suggesting a degree of arterial occlusive disease at rest.  This could be artificially elevated in diabetics due to arterial medial calcification but he was referred to Dr. [name] for further evaluation.  I saw the patient 05/2019 with leg swelling and was treated with 14 days of Lasix with 14 lb weight loss. I changed Lasix to prn because of Crt 2.22. I also decreased his amlodipine.  2D echo 05/29/2019 normal LVEF 50 to 55% with some wall motion abnormalities RV function.  Patient saw Rosaria Ferries, PA-C 06/02/2020 and was having recurrent edema.  She ordered as needed Lasix and ask him to be followed with Dr. Clover Mealy to help manage volume and renal function [name] reviewed and did not feel he needed a Myoview because patient was asymptomatic.  Endocrinologist started zestoretic a week ago and has lost 5 lbs and feeling better.  He is worried about his kidneys with being on Lasix.  Still dyspnea on exertion and has to pace himself. Having trouble with shooting pain down both arms with muscle aches and muscle aches in his legs.  Started on Zoloft by PCP for depression because of slow recovery from  CABG.  Gets dizzy when he stands up and was told his blood pressure drops when he changes position when checked by his PCP last week.   Past Medical History:  Diagnosis Date   CAD (coronary artery disease) 03/03/2019   Hypertension    Type 2 diabetes mellitus with diabetic nephropathy ([place]) 04/23/2019    Past Surgical History:  Procedure Laterality Date   BIOPSY  06/27/2019   Procedure: BIOPSY;  Surgeon: Rogene Houston, MD;  [facility];  Service: Endoscopy;;  ascending colon polyp   COLONOSCOPY N/A 06/27/2019   Procedure: COLONOSCOPY;  Surgeon: Rogene Houston, MD;  [facility];  Service: Endoscopy;  Laterality: N/A;  1030am-office rescheduled to 06/27/19 @9 :55am   CORONARY ARTERY BYPASS GRAFT N/A 03/25/2019   Procedure: CORONARY ARTERY BYPASS GRAFTING (CABG) X  4 USING LEFT INTERNAL MAMMARY ARTERY AND RIGHT SAPHENOUS VEIN GRAFTS;  Surgeon: [name], MD;  Location: [facility];  Service: Open Heart Surgery;  Laterality: N/A;   EXCISION MORTON'S NEUROMA     POLYPECTOMY  06/27/2019   Procedure: POLYPECTOMY;  Surgeon: Rogene Houston, MD;  [facility];  Service: Endoscopy;;  proximal sigmoid colon   RIGHT/LEFT HEART CATH AND CORONARY ANGIOGRAPHY N/A 03/12/2019   Procedure: RIGHT/LEFT HEART CATH AND CORONARY ANGIOGRAPHY;  Surgeon: [name], MD;  Location: [facility];  Service: Cardiovascular;  Laterality: N/A;   TEE WITHOUT CARDIOVERSION N/A 03/25/2019  Procedure: TRANSESOPHAGEAL ECHOCARDIOGRAM (TEE);  Surgeon: [name], MD;  Location: [facility];  Service: Open Heart Surgery;  Laterality: N/A;    Current Medications: Current Meds  Medication Sig   acetaminophen (TYLENOL) 500 MG tablet Take 1,000 mg by mouth every 6 (six) hours as needed for moderate pain or headache.   Ascorbic Acid (VITAMIN C) 1000 MG tablet Take 1,000 mg by mouth daily.   aspirin 325 MG EC tablet Take 1 tablet (325 mg total) by mouth  daily. (Patient taking differently: Take 325 mg by mouth daily. )   Boswellia-Glucosamine-Vit D (OSTEO BI-FLEX ONE PER DAY PO) Take 1 tablet by mouth daily.   carvedilol (COREG) 25 MG tablet TAKE ONE TABLET (25MG  TOTAL) BY MOUTH TWO TIMES DAILY WITH A MEAL   Cholecalciferol (VITAMIN D-3) 125 MCG (5000 UT) TABS Take 5,000 Units by mouth daily.   Continuous Blood Gluc Sensor (FREESTYLE LIBRE 14 DAY SENSOR) MISC Inject 1 each into the skin every 14 (fourteen) days. Use as directed.   Cyanocobalamin (B-12) 2500 MCG TABS Take 2,500 mcg by mouth daily.   diphenhydramine-acetaminophen (TYLENOL PM) 25-500 MG TABS tablet Take 2 tablets by mouth at bedtime as needed (sleep).    glipiZIDE (GLUCOTROL) 5 MG tablet Take 1 tablet (5 mg total) by mouth 2 (two) times daily before a meal.   hydrALAZINE (APRESOLINE) 100 MG tablet Take 1 tablet (100 mg total) by mouth 3 (three) times daily.   insulin glargine (LANTUS) 100 UNIT/ML injection Inject 20 Units into the skin daily.   Insulin Pen Needle (B-D ULTRAFINE III SHORT PEN) 31G X 8 MM MISC 1 each by Does not apply route as directed.   lisinopril-hydrochlorothiazide (ZESTORETIC) 20-25 MG tablet Take 1 tablet by mouth daily.   Multiple Vitamin (MULTIVITAMIN WITH MINERALS) TABS tablet Take 1 tablet by mouth daily.   prednisoLONE acetate (PRED FORTE) 1 % ophthalmic suspension Place 1 drop into the right eye 2 (two) times daily.   sertraline (ZOLOFT) 50 MG tablet Take 1 tablet (50 mg total) by mouth daily.   [DISCONTINUED] rosuvastatin (CRESTOR) 10 MG tablet Take 1 tablet (10 mg total) by mouth daily. (Patient taking differently: Take 10 mg by mouth every evening. )     Allergies:   Bee venom   Social History   Socioeconomic History   Marital status: Single    Spouse name: Not on file   Number of children: Not on file   Years of education: Not on file   Highest education level: Not on file  Occupational History   Not on file  Tobacco Use     Smoking status: Former Smoker    Quit date: 07/06/1988    Years since quitting: 31.1   Smokeless tobacco: Never Used  Substance and Sexual Activity   Alcohol use: Not Currently    Comment: socially   Drug use: No   Sexual activity: Not on file  Other Topics Concern   Not on file  Social History Narrative   Not on file   Social Determinants of Health   Financial Resource Strain:    Difficulty of Paying Living Expenses:   Food Insecurity:    Worried About Charity fundraiser in the Last Year:    Arboriculturist in the Last Year:   Transportation Needs:    Film/video editor (Medical):    Lack of Transportation (Non-Medical):   Physical Activity:    Days of Exercise per Week:    Minutes of  Exercise per Session:   Stress:    Feeling of Stress :   Social Connections:    Frequency of Communication with Friends and Family:    Frequency of Social Gatherings with Friends and Family:    Attends Religious Services:    Active Member of Clubs or Organizations:    Attends Archivist Meetings:    Marital Status:      Family History:  The patient's   family history includes Cancer in his mother; Heart Problems in his father.   ROS:   Please see the history of present illness.    ROS All other systems reviewed and are negative.   PHYSICAL EXAM:   VS:  Temp (!) 97.3 F (36.3 C)    Ht 5\' 10"  (1.778 m)    Wt 225 lb 6.4 oz (102.2 kg)    SpO2 96%    BMI 32.34 kg/m   Physical Exam  GEN: Well nourished, well developed, in no acute distress  Neck: no JVD, carotid bruits, or masses Cardiac:RRR; no murmurs, rubs, or gallops  Respiratory:  clear to auscultation bilaterally, normal work of breathing GI: soft, nontender, nondistended, + BS Ext: without cyanosis, clubbing, or edema, Good distal pulses bilaterally Neuro:  Alert and Oriented x 3 Psych: euthymic mood, full affect  Wt Readings from Last 3 Encounters:  08/26/19 225 lb 6.4 oz (102.2 kg)   08/21/19 230 lb (104.3 kg)  08/19/19 227 lb 12.8 oz (103.3 kg)      Studies/Labs Reviewed:   EKG:  EKG is not ordered today.    Recent Labs: 03/21/2019: ALT 20 03/26/2019: Magnesium 2.7 05/27/2019: BUN 37; Creatinine, Ser 2.22; Potassium 3.9; Sodium 130 08/21/2019: Hemoglobin 11.8; Platelets 238   Lipid Panel    Component Value Date/Time   CHOL 152 12/31/2018 0958   TRIG 172 (H) 12/31/2018 0958   HDL 41 12/31/2018 0958   CHOLHDL 3.9 04/26/2018 1011   VLDL 20 04/26/2018 1011   LDLCALC 77 12/31/2018 0958    Additional studies/ records that were reviewed today include:   ECHO: 05/29/2019  1. Left ventricular ejection fraction, by visual estimation, is 50 to 55%. The left ventricle has low normal function. There is mildly increased left ventricular hypertrophy.  2. Basal anterolateral segment, basal inferior segment, and basal inferolateral segment are abnormal.  3. The left ventricle demonstrates regional wall motion abnormalities.  4. Global right ventricle has mildly reduced systolic function.The right ventricular size is normal. No increase in right ventricular wall thickness.  5. Left atrial size was moderately dilated.  6. Right atrial size was normal.  7. Moderate mitral annular calcification.  8. The mitral valve is grossly normal. Trivial mitral valve regurgitation.  9. The tricuspid valve is grossly normal. Tricuspid valve regurgitation is trivial. 10. The aortic valve is tricuspid. Aortic valve regurgitation is not visualized. 11. The pulmonic valve was grossly normal. Pulmonic valve regurgitation is trivial. 12. Normal pulmonary artery systolic pressure. 13. The tricuspid regurgitant velocity is 2.30 m/s, and with an assumed right atrial pressure of 3 mmHg, the estimated right ventricular systolic pressure is normal at 24.2 mmHg. 14. The inferior vena cava is normal in size with greater than 50% respiratory variability, suggesting right atrial pressure of 3 mmHg.    CATH: 03/12/2019  Mid LAD lesion is 80% stenosed.  Ost LAD to Mid LAD lesion is 50% stenosed.  1st Diag lesion is 80% stenosed.  2nd Diag lesion is 75% stenosed.  Mid LM to Dist LM lesion  is 25% stenosed.  Prox Cx lesion is 90% stenosed.  1st Mrg lesion is 80% stenosed.  2nd Mrg lesion is 80% stenosed.  Dist RCA lesion is 90% stenosed.  Mid RCA lesion is 75% stenosed.  RPAV lesion is 80% stenosed.  LV end diastolic pressure is moderately elevated.  There is no aortic valve stenosis.  Hemodynamic findings consistent with mild pulmonary hypertension.   Severe multivessel CAD.  Plan for outpatient evaluation for CABG>  His symptoms are all with exertion.  He feels fine at rest.  I instructed him to avoid any strenuous activity, or anything that would bring on shortness of breath.  Continue aspirin and Imdur.   Continue Crestor.        ASSESSMENT:    1. Chronic combined systolic and diastolic heart failure ([place])   2. Coronary artery disease involving coronary bypass graft of native heart without angina pectoris   3. Essential hypertension   4. Type 2 diabetes mellitus with diabetic nephropathy, without long-term current use of insulin ([place])   5. Hyperlipidemia, unspecified hyperlipidemia type   6. Stage 3 chronic kidney disease, unspecified whether stage 3a or 3b CKD      PLAN:  In order of problems listed above:  Chronic combined systolic and diastolic CHF although LVEF normal at 50 to 55% with wall motion abnormalities on echo 05/2019-edema well controlled on low-dose Lasix and now Zestoretic.  Follow-up with renal and check bmet today.  CAD status post CABG 03/25/2019-difficult time recovering from CABG  Essential hypertension blood pressure controlled but having some dizziness when he stands up and has a slight drop in blood pressure from 140 down to 546 systolic when he stands.  Note by PCP as well.  I have told him about orthostatic precautions and will  continue to monitor.  Keep follow-up with [name] in 2 weeks.  Diabetes mellitus managed by PCP  Hyperlipidemia on Crestor with significant body aches.  Will stop it for 3 weeks and reassess at follow-up with [name].  If his muscle aches stop can refer to lipid clinic for possible consideration of PCSK9 inhibitor  CKD stage 3 Crt 2.22 05/27/19 will check renal today since started on a diuretic in addition to Lasix.    Medication Adjustments/Labs and Tests Ordered: Current medicines are reviewed at length with the patient today.  Concerns regarding medicines are outlined above.  Medication changes, Labs and Tests ordered today are listed in the Patient Instructions below. Patient Instructions  Medication Instructions:   Stop Crestor (Rosuvastatin).   Continue all other medications.    Labwork:  BMET - order given - please do today.   Office will contact with results via phone or letter.    Testing/Procedures: none  Follow-Up: Already scheduled with [name]   Any Other Special Instructions Will Be Listed Below (If Applicable). Be careful of positional changes (orthostatic).    If you need a refill on your cardiac medications before your next appointment, please call your pharmacy.     Sumner Boast, PA-C  08/26/2019 2:38 PM    [facility] [place], [place], [place]  [address] Phone: [phone]; Fax: [phone]

## 2019-08-22 ENCOUNTER — Encounter: Payer: Self-pay | Admitting: Family Medicine

## 2019-08-22 ENCOUNTER — Other Ambulatory Visit: Payer: Self-pay | Admitting: Family Medicine

## 2019-08-22 DIAGNOSIS — R0683 Snoring: Secondary | ICD-10-CM

## 2019-08-22 DIAGNOSIS — R400 Somnolence: Secondary | ICD-10-CM

## 2019-08-22 LAB — CBC WITH DIFFERENTIAL/PLATELET
Basophils Absolute: 0.1 10*3/uL (ref 0.0–0.2)
Basos: 2 %
EOS (ABSOLUTE): 0.3 10*3/uL (ref 0.0–0.4)
Eos: 4 %
Hematocrit: 36.6 % — ABNORMAL LOW (ref 37.5–51.0)
Hemoglobin: 11.8 g/dL — ABNORMAL LOW (ref 13.0–17.7)
Immature Grans (Abs): 0 10*3/uL (ref 0.0–0.1)
Immature Granulocytes: 0 %
Lymphocytes Absolute: 1.4 10*3/uL (ref 0.7–3.1)
Lymphs: 20 %
MCH: 27.5 pg (ref 26.6–33.0)
MCHC: 32.2 g/dL (ref 31.5–35.7)
MCV: 85 fL (ref 79–97)
Monocytes Absolute: 0.7 10*3/uL (ref 0.1–0.9)
Monocytes: 10 %
Neutrophils Absolute: 4.5 10*3/uL (ref 1.4–7.0)
Neutrophils: 64 %
Platelets: 238 10*3/uL (ref 150–450)
RBC: 4.29 x10E6/uL (ref 4.14–5.80)
RDW: 16 % — ABNORMAL HIGH (ref 11.6–15.4)
WBC: 7 10*3/uL (ref 3.4–10.8)

## 2019-08-22 LAB — CK: Total CK: 114 U/L (ref 41–331)

## 2019-08-22 LAB — SEDIMENTATION RATE: Sed Rate: 9 mm/hr (ref 0–30)

## 2019-08-22 LAB — C-REACTIVE PROTEIN: CRP: 3 mg/L (ref 0–10)

## 2019-08-22 NOTE — Telephone Encounter (Signed)
Can you print off the letter that provider dictated today? Provider will need to sign also. Thank you

## 2019-08-22 NOTE — Telephone Encounter (Signed)
Letter printed off and provider signed. Pt is aware that letter is up front ready for pickup.

## 2019-08-22 NOTE — Telephone Encounter (Signed)
The patient had requested a letter for disability purposes.  This is a very generic letter in other words it does not have a lot of detail..  I will be happy to sign it.  Then patient can pick it up. Hopefully this suffices for the purposes of this taxes As for the documentation of his visit yesterday it was much more detailed regarding how I do believe that he is disabled.  The patient stated he was in the works of getting a Chief Executive Officer to help him with his disability and certainly the [name] will more than likely get his medical information together including this recent office visit

## 2019-08-22 NOTE — Progress Notes (Signed)
error 

## 2019-08-23 ENCOUNTER — Other Ambulatory Visit: Payer: Self-pay | Admitting: *Deleted

## 2019-08-23 DIAGNOSIS — D649 Anemia, unspecified: Secondary | ICD-10-CM

## 2019-08-23 LAB — IFOBT (OCCULT BLOOD): IFOBT: NEGATIVE

## 2019-08-26 ENCOUNTER — Ambulatory Visit (INDEPENDENT_AMBULATORY_CARE_PROVIDER_SITE_OTHER): Payer: Medicaid Other | Admitting: Physician Assistant

## 2019-08-26 ENCOUNTER — Other Ambulatory Visit (HOSPITAL_COMMUNITY)
Admission: RE | Admit: 2019-08-26 | Discharge: 2019-08-26 | Disposition: A | Discharge status: Home / Self Care | Payer: Medicaid Other | Source: Ambulatory Visit | Attending: Physician Assistant | Admitting: Physician Assistant

## 2019-08-26 ENCOUNTER — Encounter: Payer: Self-pay | Admitting: Physician Assistant

## 2019-08-26 ENCOUNTER — Other Ambulatory Visit: Payer: Self-pay

## 2019-08-26 VITALS — Temp 97.3°F | Ht 70.0 in | Wt 225.4 lb

## 2019-08-26 DIAGNOSIS — I5042 Chronic combined systolic (congestive) and diastolic (congestive) heart failure: Secondary | ICD-10-CM | POA: Diagnosis not present

## 2019-08-26 DIAGNOSIS — E785 Hyperlipidemia, unspecified: Secondary | ICD-10-CM | POA: Diagnosis not present

## 2019-08-26 DIAGNOSIS — I110 Hypertensive heart disease with heart failure: Secondary | ICD-10-CM | POA: Insufficient documentation

## 2019-08-26 DIAGNOSIS — N183 Chronic kidney disease, stage 3 unspecified: Secondary | ICD-10-CM

## 2019-08-26 DIAGNOSIS — E1121 Type 2 diabetes mellitus with diabetic nephropathy: Secondary | ICD-10-CM

## 2019-08-26 DIAGNOSIS — I25810 Atherosclerosis of coronary artery bypass graft(s) without angina pectoris: Secondary | ICD-10-CM

## 2019-08-26 DIAGNOSIS — I10 Essential (primary) hypertension: Secondary | ICD-10-CM

## 2019-08-26 LAB — BASIC METABOLIC PANEL
Anion gap: 9 (ref 5–15)
BUN: 36 mg/dL — ABNORMAL HIGH (ref 8–23)
CO2: 23 mmol/L (ref 22–32)
Calcium: 9.3 mg/dL (ref 8.9–10.3)
Chloride: 101 mmol/L (ref 98–111)
Creatinine, Ser: 2.62 mg/dL — ABNORMAL HIGH (ref 0.61–1.24)
GFR calc Af Amer: 29 mL/min — ABNORMAL LOW (ref >60)
GFR calc non Af Amer: 25 mL/min — ABNORMAL LOW (ref >60)
Glucose, Bld: 310 mg/dL — ABNORMAL HIGH (ref 70–99)
Potassium: 4.3 mmol/L (ref 3.5–5.1)
Sodium: 133 mmol/L — ABNORMAL LOW (ref 135–145)

## 2019-08-26 NOTE — Patient Instructions (Addendum)
Medication Instructions:   Stop Crestor (Rosuvastatin).   Continue all other medications.    Labwork:  BMET - order given - please do today.   Office will contact with results via phone or letter.    Testing/Procedures: none  Follow-Up: Already scheduled with [name]   Any Other Special Instructions Will Be Listed Below (If Applicable). Be careful of positional changes (orthostatic).    If you need a refill on your cardiac medications before your next appointment, please call your pharmacy.

## 2019-08-27 ENCOUNTER — Telehealth: Payer: Self-pay | Admitting: "Endocrinology

## 2019-08-27 MED ORDER — BLOOD GLUCOSE MONITOR KIT: 1.0000 | PACK | Freq: Two times a day (BID) | 0 refills | Status: DC

## 2019-08-27 NOTE — Telephone Encounter (Signed)
Patient would like a new meter/strips/lancets called in. He has medicaid so can you send in a generic one. Called into [facility] pharmacy

## 2019-08-27 NOTE — Telephone Encounter (Signed)
Rx for glucose meter kit and supplies sent to [facility].

## 2019-09-04 ENCOUNTER — Encounter: Payer: Self-pay | Admitting: Nutrition

## 2019-09-04 NOTE — Patient Instructions (Signed)
Goals' Follow My Plate  Eat meals on time Avoid excessive protein and high potassium and phosphorus foods. Watch portion sizes Eat 45-60 grams of carbs per meal. Increase exercise as allowed. Use herbs and spices. Lose 2-3 lbs per month. Get A1C 7% or less.

## 2019-09-09 ENCOUNTER — Other Ambulatory Visit: Payer: Self-pay | Admitting: Physician Assistant

## 2019-09-10 ENCOUNTER — Encounter: Payer: Self-pay | Admitting: Cardiovascular Disease

## 2019-09-10 ENCOUNTER — Ambulatory Visit (INDEPENDENT_AMBULATORY_CARE_PROVIDER_SITE_OTHER): Payer: Medicaid Other | Admitting: Cardiovascular Disease

## 2019-09-10 VITALS — BP 158/94 | HR 74 | Temp 98.5°F | Ht 67.0 in | Wt 232.0 lb

## 2019-09-10 DIAGNOSIS — E785 Hyperlipidemia, unspecified: Secondary | ICD-10-CM

## 2019-09-10 DIAGNOSIS — I10 Essential (primary) hypertension: Secondary | ICD-10-CM

## 2019-09-10 DIAGNOSIS — I5032 Chronic diastolic (congestive) heart failure: Secondary | ICD-10-CM | POA: Diagnosis not present

## 2019-09-10 DIAGNOSIS — R0600 Dyspnea, unspecified: Secondary | ICD-10-CM

## 2019-09-10 DIAGNOSIS — N183 Chronic kidney disease, stage 3 unspecified: Secondary | ICD-10-CM

## 2019-09-10 DIAGNOSIS — I25708 Atherosclerosis of coronary artery bypass graft(s), unspecified, with other forms of angina pectoris: Secondary | ICD-10-CM | POA: Diagnosis not present

## 2019-09-10 DIAGNOSIS — R0609 Other forms of dyspnea: Secondary | ICD-10-CM

## 2019-09-10 MED ORDER — CARVEDILOL 3.125 MG PO TABS: 3.1250 mg | ORAL_TABLET | Freq: Two times a day (BID) | ORAL | 3 refills | Status: DC

## 2019-09-10 NOTE — Patient Instructions (Signed)
Medication Instructions:  DECREASE Coreg to 3.125 mg twice a day   *If you need a refill on your cardiac medications before your next appointment, please call your pharmacy*   Lab Work: None today If you have labs (blood work) drawn today and your tests are completely normal, you will receive your results only by: Marland Kitchen MyChart Message (if you have MyChart) OR . A paper copy in the mail If you have any lab test that is abnormal or we need to change your treatment, we will call you to review the results.   Testing/Procedures: Your physician has requested that you have a lexiscan myoview. For further information please visit [email or web address]. Please follow instruction sheet, as given.     Follow-Up: At [facility], you and your health needs are our priority.  As part of our continuing mission to provide you with exceptional heart care, we have created designated Provider Care Teams.  These Care Teams include your primary Cardiologist (physician) and Advanced Practice Providers (APPs -  Physician Assistants and Nurse Practitioners) who all work together to provide you with the care you need, when you need it.  We recommend signing up for the patient portal called "MyChart".  Sign up information is provided on this After Visit Summary.  MyChart is used to connect with patients for Virtual Visits (Telemedicine).  Patients are able to view lab/test results, encounter notes, upcoming appointments, etc.  Non-urgent messages can be sent to your provider as well.   To learn more about what you can do with MyChart, go to [email or web address].    Your next appointment:   3 month(s)  The format for your next appointment:   In Person  Provider:   [name], MD   Other Instructions None      Thank you for choosing [facility] !

## 2019-09-10 NOTE — Progress Notes (Signed)
SUBJECTIVE: The patient presents for routine follow-up for coronary artery disease. He underwent 5 vessel CABG on 03/25/2019 with a LIMA to the LAD, and SVG to the PLV, OM 2, and D1.  ABIs were unremarkable on 05/21/2019.  Echocardiogram in December 2020 demonstrated LVEF 50 to 55%.  He was started on Zoloft by his PCP but depression due to a slow recovery from CABG.  When he was evaluated in our office on 08/26/2019 he was complaining of orthostatic dizziness.  He has required Lasix due to bilateral leg edema.  He has chronic kidney disease stage III.  He has multiple, diffuse meta complaints.  He stopped taking Zoloft as he does not feel he is depressed.  He said he is only frustrated due to the condition of his body.  He complains of exertional dyspnea when walking in the grocery store or when walking 15 to 20 feet.  He is scheduled for a sleep study in April 7 but he said he is not going to do this as he has no difficulty sleeping.  He complains of a cough productive of clear mucus.  He has some cloudiness in his left eye.  He has had a 15 pound weight gain.  He has been compliant with a cardiac diet.   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 He is married. He is originally from [place] but moved here at the age of 44. His father was a Garment/textile technologist.  Review of Systems: As per "subjective", otherwise negative.  Allergies  Allergen Reactions  . Bee Venom Anaphylaxis    Current Outpatient Medications  Medication Sig Dispense Refill  . acetaminophen (TYLENOL) 500 MG tablet Take 1,000 mg by mouth every 6 (six) hours as needed for moderate pain or headache.    . Ascorbic Acid (VITAMIN C) 1000 MG tablet Take 1,000 mg by mouth daily.    Marland Kitchen aspirin EC 81 MG tablet Take 81 mg by mouth daily.    . blood glucose meter kit and supplies KIT 1 each by Does  not apply route 2 (two) times daily. Dispense based on patient and insurance preference. Use two times daily as directed. (FOR ICD-10 E11.59). 1 each 0  . [place]well[place]-Glucosamine-Vit D (OSTEO BI-FLEX ONE PER DAY PO) Take 1 tablet by mouth daily.    . carvedilol (COREG) 25 MG tablet TAKE ONE TABLET (25MG TOTAL) BY MOUTH TWO TIMES DAILY WITH A MEAL 60 tablet 1  . Cholecalciferol (VITAMIN D-3) 125 MCG (5000 UT) TABS Take 5,000 Units by mouth daily.    . Continuous Blood Gluc Sensor (FREESTYLE LIBRE 14 DAY SENSOR) MISC Inject 1 each into the skin every 14 (fourteen) days. Use as directed. 2 each 2  . Cyanocobalamin (B-12) 2500 MCG TABS Take 2,500 mcg by mouth daily.    . diphenhydramine-acetaminophen (TYLENOL PM) 25-500 MG TABS tablet Take 2 tablets by mouth at bedtime as needed (sleep).     Marland Kitchen glipiZIDE (GLUCOTROL) 5 MG tablet Take 1 tablet (5 mg total) by mouth 2 (two) times daily before a meal. 60 tablet 2  . hydrALAZINE (APRESOLINE) 100 MG tablet Take 1 tablet (100 mg total) by mouth 3 (three) times daily. 270 tablet 3  . insulin glargine (LANTUS) 100 UNIT/ML injection Inject 20 Units into the skin daily.    . Insulin Pen Needle (B-D ULTRAFINE III SHORT PEN) 31G X  8 MM MISC 1 each by Does not apply route as directed. 100 each 3  . Multiple Vitamin (MULTIVITAMIN WITH MINERALS) TABS tablet Take 1 tablet by mouth daily.    . sertraline (ZOLOFT) 50 MG tablet Take 1 tablet (50 mg total) by mouth daily. 30 tablet 3   No current facility-administered medications for this visit.    Past Medical History:  Diagnosis Date  . CAD (coronary artery disease) 03/03/2019  . Hypertension   . Type 2 diabetes mellitus with diabetic nephropathy ([facility] [place]) 04/23/2019    Past Surgical History:  Procedure Laterality Date  . BIOPSY  06/27/2019   Procedure: BIOPSY;  Surgeon: Rogene Houston, MD;  [facility];  Service: Endoscopy;;  ascending colon polyp  . COLONOSCOPY N/A 06/27/2019   Procedure: COLONOSCOPY;   Surgeon: Rogene Houston, MD;  [facility];  Service: Endoscopy;  Laterality: N/A;  1030am-office rescheduled to 06/27/19 '@9' :55am  . CORONARY ARTERY BYPASS GRAFT N/A 03/25/2019   Procedure: CORONARY ARTERY BYPASS GRAFTING (CABG) X  4 USING LEFT INTERNAL MAMMARY ARTERY AND RIGHT SAPHENOUS VEIN GRAFTS;  Surgeon: [name], MD;  Location: [facility];  Service: Open Heart Surgery;  Laterality: N/A;  . EXCISION MORTON'S NEUROMA    . POLYPECTOMY  06/27/2019   Procedure: POLYPECTOMY;  Surgeon: Rogene Houston, MD;  [facility];  Service: Endoscopy;;  proximal sigmoid colon  . RIGHT/LEFT HEART CATH AND CORONARY ANGIOGRAPHY N/A 03/12/2019   Procedure: RIGHT/LEFT HEART CATH AND CORONARY ANGIOGRAPHY;  Surgeon: [name], MD;  Location: [facility];  Service: Cardiovascular;  Laterality: N/A;  . TEE WITHOUT CARDIOVERSION N/A 03/25/2019   Procedure: TRANSESOPHAGEAL ECHOCARDIOGRAM (TEE);  Surgeon: [name], MD;  Location: [facility];  Service: Open Heart Surgery;  Laterality: N/A;    Social History   Socioeconomic History  . Marital status: Single    Spouse name: Not on file  . Number of children: Not on file  . Years of education: Not on file  . Highest education level: Not on file  Occupational History  . Not on file  Tobacco Use  . Smoking status: Former Smoker    Quit date: 07/06/1988    Years since quitting: 31.2  . Smokeless tobacco: Never Used  Substance and Sexual Activity  . Alcohol use: Not Currently    Comment: socially  . Drug use: No  . Sexual activity: Not on file  Other Topics Concern  . Not on file  Social History Narrative  . Not on file   Social Determinants of Health   Financial Resource Strain:   . Difficulty of Paying Living Expenses:   Food Insecurity:   . Worried About Charity fundraiser in the Last Year:   . Arboriculturist in the Last Year:   Transportation Needs:   . Film/video editor (Medical):   Marland Kitchen  Lack of Transportation (Non-Medical):   Physical Activity:   . Days of Exercise per Week:   . Minutes of Exercise per Session:   Stress:   . Feeling of Stress :   Social Connections:   . Frequency of Communication with Friends and Family:   . Frequency of Social Gatherings with Friends and Family:   . Attends Religious Services:   . Active Member of Clubs or Organizations:   . Attends Archivist Meetings:   Marland Kitchen Marital Status:   Intimate Partner Violence:   . Fear of Current or Ex-Partner:   .  Emotionally Abused:   Marland Kitchen Physically Abused:   . Sexually Abused:      Vitals:   09/10/19 1249  BP: (!) 158/94  Pulse: 74  Temp: 98.5 F (36.9 C)  SpO2: 97%  Weight: 232 lb (105.2 kg)  Height: '5\' 7"'  (1.702 m)    Wt Readings from Last 3 Encounters:  09/10/19 232 lb (105.2 kg)  08/26/19 225 lb 6.4 oz (102.2 kg)  08/21/19 230 lb (104.3 kg)     PHYSICAL EXAM General: NAD HEENT: Normal. Neck: No JVD, no thyromegaly. Lungs: Clear to auscultation bilaterally with normal respiratory effort. CV: Regular rate and rhythm, normal S1/S2, no S3/S4, no murmur. No pretibial or periankle edema.   Abdomen: Soft, nontender, no distention.  Neurologic: Alert and oriented.  Psych: Normal affect. Skin: Normal. Musculoskeletal: No gross deformities.      Labs: Lab Results  Component Value Date/Time   K 4.3 08/26/2019 02:32 PM   BUN 36 (H) 08/26/2019 02:32 PM   BUN 23 04/23/2019 01:58 PM   CREATININE 2.62 (H) 08/26/2019 02:32 PM   CREATININE 1.94 (H) 05/13/2019 10:29 AM   ALT 20 03/21/2019 03:26 PM   TSH 2.110 04/29/2018 09:00 AM   HGB 11.8 (L) 08/21/2019 02:39 PM     Lipids: Lab Results  Component Value Date/Time   LDLCALC 77 12/31/2018 09:58 AM   CHOL 152 12/31/2018 09:58 AM   TRIG 172 (H) 12/31/2018 09:58 AM   HDL 41 12/31/2018 09:58 AM      Echocardiogram 05/29/2019:  1. Left ventricular ejection fraction, by visual estimation, is 50 to  55%. The left ventricle  has low normal function. There is mildly increased  left ventricular hypertrophy.  2. Basal anterolateral segment, basal inferior segment, and basal  inferolateral segment are abnormal.  3. The left ventricle demonstrates regional wall motion abnormalities.  4. Global right ventricle has mildly reduced systolic function.The right  ventricular size is normal. No increase in right ventricular wall  thickness.  5. Left atrial size was moderately dilated.  6. Right atrial size was normal.  7. Moderate mitral annular calcification.  8. The mitral valve is grossly normal. Trivial mitral valve  regurgitation.  9. The tricuspid valve is grossly normal. Tricuspid valve regurgitation  is trivial.  10. The aortic valve is tricuspid. Aortic valve regurgitation is not  visualized.  11. The pulmonic valve was grossly normal. Pulmonic valve regurgitation is  trivial.  12. Normal pulmonary artery systolic pressure.  13. The tricuspid regurgitant velocity is 2.30 m/s, and with an assumed  right atrial pressure of 3 mmHg, the estimated right ventricular systolic  pressure is normal at 24.2 mmHg.  14. The inferior vena cava is normal in size with greater than 50%  respiratory variability, suggesting right atrial pressure of 3 mmHg.   ASSESSMENT AND PLAN:  1.  Coronary artery disease: He complains of exertional dyspnea when walking short distances.  I will obtain a Lexiscan Myoview given the wall motion abnormality seen on most recent echocardiogram.  He underwent 5 vessel CABG on 03/25/2019 with a LIMA to the LAD, and SVG to the PLV, OM 2, and D1. LVEF 50 to 55% by echocardiogram on 05/29/2019. Continue aspirin and carvedilol (I will reduce the dose drastically to 3.125 mg to see if this improves his symptoms of fatigue, lack of enthusiasm, and weight gain).  2.  Hypertension: Blood pressure is elevated.  Currently on carvedilol and hydralazine.  This will need further monitoring given reduction  in carvedilol dose  as noted above.  He is also followed by nephrology.  3.  Chronic kidney disease stage III: BUN 36 and creatinine 2.62 on 08/26/2019.  Diuretics on hold.  Followed by nephrology.  4.  Hyperlipidemia: Rosuvastatin stopped on 08/26/2019 due to diffuse body aches.  He may need PCSK9 inhibitors.  He said stopping rosuvastatin minimally helped to improve his symptoms.  5.  Chronic diastolic heart failure: Due to worsening renal function, diuretics are currently on hold.    Disposition: Follow up 3 months  Time spent: 40 minutes, of which greater than 50% was spent reviewing symptoms, relevant blood tests and studies, and discussing management plan with the patient.    [name], M.D., F.A.C.C.

## 2019-09-12 ENCOUNTER — Ambulatory Visit: Payer: Medicaid Other | Admitting: Family Medicine

## 2019-09-12 ENCOUNTER — Other Ambulatory Visit: Payer: Self-pay

## 2019-09-12 VITALS — BP 160/108 | Temp 98.4°F | Ht 67.0 in | Wt 230.0 lb

## 2019-09-12 DIAGNOSIS — E785 Hyperlipidemia, unspecified: Secondary | ICD-10-CM

## 2019-09-12 DIAGNOSIS — E1121 Type 2 diabetes mellitus with diabetic nephropathy: Secondary | ICD-10-CM

## 2019-09-12 DIAGNOSIS — I10 Essential (primary) hypertension: Secondary | ICD-10-CM

## 2019-09-12 DIAGNOSIS — N183 Chronic kidney disease, stage 3 unspecified: Secondary | ICD-10-CM | POA: Diagnosis not present

## 2019-09-12 NOTE — Progress Notes (Signed)
   Subjective:    Patient ID: [name], male    DOB: 08/16/1955, 64 y.o.   MRN: [id number]  HPIpt states he was told to come in for med check. Not getting any meds from dr Hildegard Hlavac.  It was requested by his case manager that he come in and go over all his medications he comes in today with his significant other Would like to discuss doing sleep study. Scheduled on 4/7.  Patient is concerned that his sleep study is not scheduled for long enough but we did discuss how they do go through protocols and hopefully everything will go fine but if for some reason it is not he will let me know   Review of Systems     Objective:   Physical Exam Lungs clear respiratory rate normal heart regular no murmurs  All medications were reviewed and patient does appear to be compliant with the current list     Assessment & Plan:  1. Essential hypertension, benign bp good watch salt cont mes  2. CRI (chronic renal insufficiency), stage 3 (moderate) Followed by nephrology  3. Hyperlipidemia, unspecified hyperlipidemia type Med stopped by cardio  4. Diabetic nephropathy associated with type 2 diabetes mellitus (HCC) Moderate severe followed by cardio and nephrology also will be seeing endocrinology  Sleep study pending with results 3 to  4 weeks later  meds and compliance verified

## 2019-09-16 ENCOUNTER — Other Ambulatory Visit (HOSPITAL_COMMUNITY)
Admission: RE | Admit: 2019-09-16 | Discharge: 2019-09-16 | Disposition: A | Discharge status: Home / Self Care | Payer: Medicaid Other | Source: Ambulatory Visit | Attending: Neurology | Admitting: Neurology

## 2019-09-16 ENCOUNTER — Other Ambulatory Visit: Payer: Self-pay

## 2019-09-19 DIAGNOSIS — E113532 Type 2 diabetes mellitus with proliferative diabetic retinopathy with traction retinal detachment not involving the macula, left eye: Secondary | ICD-10-CM | POA: Diagnosis not present

## 2019-09-19 DIAGNOSIS — H4313 Vitreous hemorrhage, bilateral: Secondary | ICD-10-CM | POA: Diagnosis not present

## 2019-09-19 DIAGNOSIS — E113511 Type 2 diabetes mellitus with proliferative diabetic retinopathy with macular edema, right eye: Secondary | ICD-10-CM | POA: Diagnosis not present

## 2019-09-19 DIAGNOSIS — H2513 Age-related nuclear cataract, bilateral: Secondary | ICD-10-CM | POA: Diagnosis not present

## 2019-09-21 DIAGNOSIS — Z23 Encounter for immunization: Secondary | ICD-10-CM | POA: Diagnosis not present

## 2019-09-23 DIAGNOSIS — N1832 Chronic kidney disease, stage 3b: Secondary | ICD-10-CM | POA: Diagnosis not present

## 2019-09-23 DIAGNOSIS — I129 Hypertensive chronic kidney disease with stage 1 through stage 4 chronic kidney disease, or unspecified chronic kidney disease: Secondary | ICD-10-CM | POA: Diagnosis not present

## 2019-09-23 DIAGNOSIS — R5383 Other fatigue: Secondary | ICD-10-CM | POA: Diagnosis not present

## 2019-09-23 DIAGNOSIS — E1122 Type 2 diabetes mellitus with diabetic chronic kidney disease: Secondary | ICD-10-CM | POA: Diagnosis not present

## 2019-09-25 ENCOUNTER — Ambulatory Visit (HOSPITAL_COMMUNITY)
Admission: RE | Admit: 2019-09-25 | Discharge: 2019-09-25 | Disposition: A | Discharge status: Home / Self Care | Payer: Medicaid Other | Source: Ambulatory Visit | Attending: Cardiovascular Disease | Admitting: Cardiovascular Disease

## 2019-09-25 ENCOUNTER — Other Ambulatory Visit: Payer: Self-pay

## 2019-09-25 ENCOUNTER — Encounter (HOSPITAL_BASED_OUTPATIENT_CLINIC_OR_DEPARTMENT_OTHER)
Admission: RE | Admit: 2019-09-25 | Discharge: 2019-09-25 | Disposition: A | Discharge status: Home / Self Care | Payer: Medicaid Other | Source: Ambulatory Visit | Attending: Cardiovascular Disease | Admitting: Cardiovascular Disease

## 2019-09-25 DIAGNOSIS — R0600 Dyspnea, unspecified: Secondary | ICD-10-CM

## 2019-09-25 DIAGNOSIS — R0609 Other forms of dyspnea: Secondary | ICD-10-CM

## 2019-09-25 LAB — NM MYOCAR MULTI W/SPECT W/WALL MOTION / EF
LV dias vol: 165 mL (ref 62–150)
LV sys vol: 107 mL
Peak HR: 103 {beats}/min
RATE: 0.45
Rest HR: 91 {beats}/min
SDS: 1
SRS: 4
SSS: 5
TID: 1.07

## 2019-09-25 MED ORDER — TECHNETIUM TC 99M TETROFOSMIN IV KIT
10.0000 | PACK | Freq: Once | INTRAVENOUS | Status: AC | PRN
  Administered 2019-09-25: 10.4 via INTRAVENOUS

## 2019-09-25 MED ORDER — SODIUM CHLORIDE FLUSH 0.9 % IV SOLN
INTRAVENOUS | Status: AC
  Administered 2019-09-25: 11:00:00 10 mL via INTRAVENOUS
  Filled 2019-09-25: qty 10

## 2019-09-25 MED ORDER — TECHNETIUM TC 99M TETROFOSMIN IV KIT
30.0000 | PACK | Freq: Once | INTRAVENOUS | Status: AC | PRN
  Administered 2019-09-25: 32 via INTRAVENOUS

## 2019-09-25 MED ORDER — REGADENOSON 0.4 MG/5ML IV SOLN
INTRAVENOUS | Status: AC
  Administered 2019-09-25: 0.4 mg via INTRAVENOUS
  Filled 2019-09-25: qty 5

## 2019-09-27 ENCOUNTER — Telehealth: Payer: Self-pay

## 2019-09-27 DIAGNOSIS — R931 Abnormal findings on diagnostic imaging of heart and coronary circulation: Secondary | ICD-10-CM

## 2019-09-27 NOTE — Telephone Encounter (Signed)
Echo scheduled for 4/26

## 2019-09-27 NOTE — Telephone Encounter (Signed)
-----   Message from Herminio Commons, MD sent at 09/27/2019  2:16 PM EDT ----- Evidence of prior heart attack with small area of blockage typically treated with medications only.  Cardiac function is possibly reduced and had been normal by previous echocardiogram.  Please obtain a follow-up echocardiogram for a more accurate assessment of LV systolic function.

## 2019-10-02 ENCOUNTER — Ambulatory Visit: Payer: Medicaid Other | Admitting: Family Medicine

## 2019-10-02 ENCOUNTER — Encounter: Payer: Self-pay | Admitting: Family Medicine

## 2019-10-02 ENCOUNTER — Other Ambulatory Visit: Payer: Self-pay

## 2019-10-02 VITALS — BP 128/82 | Temp 97.5°F | Ht 67.0 in | Wt 227.2 lb

## 2019-10-02 DIAGNOSIS — Z125 Encounter for screening for malignant neoplasm of prostate: Secondary | ICD-10-CM

## 2019-10-02 DIAGNOSIS — Z79899 Other long term (current) drug therapy: Secondary | ICD-10-CM

## 2019-10-02 DIAGNOSIS — R5383 Other fatigue: Secondary | ICD-10-CM

## 2019-10-02 DIAGNOSIS — E538 Deficiency of other specified B group vitamins: Secondary | ICD-10-CM

## 2019-10-02 DIAGNOSIS — E785 Hyperlipidemia, unspecified: Secondary | ICD-10-CM

## 2019-10-02 DIAGNOSIS — E611 Iron deficiency: Secondary | ICD-10-CM | POA: Diagnosis not present

## 2019-10-02 NOTE — Progress Notes (Signed)
   Subjective:    Patient ID: [name], male    DOB: 1956/02/20, 64 y.o.   MRN: [id number]  Hypertension This is a chronic problem. The current episode started more than 1 year ago. Pertinent negatives include no chest pain, headaches or neck pain. Risk factors for coronary artery disease include male gender. Treatments tried: coreg, apresoline. There are no compliance problems.    Patient denies any type of chest pressure does relate get short of breath with activity. Undergoing further evaluation by cardiology with echo had a recent stress test which was abnormal   Review of Systems  Constitutional: Negative for activity change, appetite change and fever.  HENT: Negative for congestion and rhinorrhea.   Eyes: Negative for discharge.  Respiratory: Negative for cough and wheezing.   Cardiovascular: Negative for chest pain.  Gastrointestinal: Negative for abdominal pain, blood in stool and vomiting.  Genitourinary: Negative for difficulty urinating and frequency.  Musculoskeletal: Negative for neck pain.  Skin: Negative for rash.  Allergic/Immunologic: Negative for environmental allergies and food allergies.  Neurological: Negative for weakness and headaches.  Psychiatric/Behavioral: Negative for agitation.       Objective:   Physical Exam Constitutional:      Appearance: He is well-developed.  HENT:     Head: Normocephalic and atraumatic.     Right Ear: External ear normal.     Left Ear: External ear normal.     Nose: Nose normal.  Eyes:     Pupils: Pupils are equal, round, and reactive to light.  Neck:     Thyroid: No thyromegaly.  Cardiovascular:     Rate and Rhythm: Normal rate and regular rhythm.     Heart sounds: Normal heart sounds. No murmur.  Pulmonary:     Effort: Pulmonary effort is normal. No respiratory distress.     Breath sounds: Normal breath sounds. No wheezing.  Abdominal:     General: Bowel sounds are normal. There is no distension.   Palpations: Abdomen is soft. There is no mass.     Tenderness: There is no abdominal tenderness.  Genitourinary:    Penis: Normal.   Musculoskeletal:        General: Normal range of motion.     Cervical back: Normal range of motion and neck supple.  Lymphadenopathy:     Cervical: No cervical adenopathy.  Skin:    General: Skin is warm and dry.     Findings: No erythema.  Neurological:     Mental Status: He is alert.     Motor: No abnormal muscle tone.  Psychiatric:        Behavior: Behavior normal.        Judgment: Judgment normal.            Assessment & Plan:  1. Hyperlipidemia, unspecified hyperlipidemia type Check lipid profile. Continue current medication. Watch diet closely. - Lipid panel  2. Vitamin B 12 deficiency Given his hemoglobin being slightly low I am concerned about the B12 being low check B12 level - Vitamin B12  3. Iron deficiency We will also check for iron deficiency - CBC with Differential/Platelet - Ferritin  4. Screening for prostate cancer Screening for PSA - PSA  5. Other fatigue I believe some of the fatigue could be related to underlying sleep apnea but patient doesn't feel he has had we will let cardiology do their work-up if he still has ongoing fatigue that is not explained by Cardiologic issues I would recommend sleep study

## 2019-10-07 ENCOUNTER — Ambulatory Visit (HOSPITAL_COMMUNITY)
Admission: RE | Admit: 2019-10-07 | Discharge: 2019-10-07 | Disposition: A | Discharge status: Home / Self Care | Payer: Medicaid Other | Source: Ambulatory Visit | Attending: Cardiovascular Disease | Admitting: Cardiovascular Disease

## 2019-10-07 ENCOUNTER — Other Ambulatory Visit: Payer: Self-pay

## 2019-10-07 DIAGNOSIS — R931 Abnormal findings on diagnostic imaging of heart and coronary circulation: Secondary | ICD-10-CM | POA: Diagnosis not present

## 2019-10-07 NOTE — Progress Notes (Signed)
*  PRELIMINARY RESULTS* Echocardiogram 2D Echocardiogram has been performed.  Leavy Cella 10/07/2019, 3:01 PM

## 2019-10-15 ENCOUNTER — Ambulatory Visit (INDEPENDENT_AMBULATORY_CARE_PROVIDER_SITE_OTHER): Payer: Medicaid Other | Admitting: Cardiovascular Disease

## 2019-10-15 ENCOUNTER — Other Ambulatory Visit: Payer: Self-pay

## 2019-10-15 ENCOUNTER — Encounter: Payer: Self-pay | Admitting: Cardiovascular Disease

## 2019-10-15 VITALS — BP 162/94 | HR 95 | Temp 98.8°F | Ht 70.0 in | Wt 230.0 lb

## 2019-10-15 DIAGNOSIS — I5032 Chronic diastolic (congestive) heart failure: Secondary | ICD-10-CM | POA: Diagnosis not present

## 2019-10-15 DIAGNOSIS — R0602 Shortness of breath: Secondary | ICD-10-CM

## 2019-10-15 DIAGNOSIS — I25708 Atherosclerosis of coronary artery bypass graft(s), unspecified, with other forms of angina pectoris: Secondary | ICD-10-CM

## 2019-10-15 DIAGNOSIS — I10 Essential (primary) hypertension: Secondary | ICD-10-CM

## 2019-10-15 DIAGNOSIS — R0600 Dyspnea, unspecified: Secondary | ICD-10-CM | POA: Diagnosis not present

## 2019-10-15 DIAGNOSIS — R0609 Other forms of dyspnea: Secondary | ICD-10-CM

## 2019-10-15 DIAGNOSIS — E785 Hyperlipidemia, unspecified: Secondary | ICD-10-CM

## 2019-10-15 DIAGNOSIS — N183 Chronic kidney disease, stage 3 unspecified: Secondary | ICD-10-CM

## 2019-10-15 MED ORDER — AMLODIPINE BESYLATE 5 MG PO TABS: 5.0000 mg | ORAL_TABLET | Freq: Every day | ORAL | 3 refills | Status: DC

## 2019-10-15 NOTE — Progress Notes (Signed)
SUBJECTIVE: The patient presents for routine follow-up for coronary artery disease. He underwent 5 vessel CABG on 03/25/2019 with a LIMA to the LAD, and SVG to the PLV, OM 2, and D1.  ABIs were unremarkable on 05/21/2019.  Echocardiogram in December 2020 demonstrated LVEF 50 to 55%.  He was started on Zoloft by his PCP for depression due to a slow recovery from CABG.  I reduced the dose of Coreg at his last visit to see if this would improve his symptoms of fatigue.  He stopped it altogether.  He did not follow through with a sleep study because he said he has no trouble sleeping.  There have been no witnessed apneic episodes.  He denies exertional chest pain but does continue complain of exertional dyspnea and fatigue.  His blood pressures checked daily and it is sometimes in the 200/100 range.  He takes a diuretic for leg swelling and cough as prescribed by his nephrologist.  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 He is married. He is originally from [place] but moved here at the age of 37. His father was a Garment/textile technologist.  Review of Systems: As per "subjective", otherwise negative.  Allergies  Allergen Reactions  . Bee Venom Anaphylaxis    Current Outpatient Medications  Medication Sig Dispense Refill  . acetaminophen (TYLENOL) 500 MG tablet Take 1,000 mg by mouth every 6 (six) hours as needed for moderate pain or headache.    . Ascorbic Acid (VITAMIN C) 1000 MG tablet Take 1,000 mg by mouth daily.    Marland Kitchen aspirin EC 81 MG tablet Take 81 mg by mouth daily.    . blood glucose meter kit and supplies KIT 1 each by Does not apply route 2 (two) times daily. Dispense based on patient and insurance preference. Use two times daily as directed. (FOR ICD-10 E11.59). 1 each 0  . Boswellia-Glucosamine-Vit D (OSTEO BI-FLEX ONE PER DAY PO) Take 1 tablet by  mouth daily.    . Cholecalciferol (VITAMIN D-3) 125 MCG (5000 UT) TABS Take 5,000 Units by mouth daily.    . Cyanocobalamin (B-12) 2500 MCG TABS Take 2,500 mcg by mouth daily.    . diphenhydramine-acetaminophen (TYLENOL PM) 25-500 MG TABS tablet Take 2 tablets by mouth at bedtime as needed (sleep).     Marland Kitchen glipiZIDE (GLUCOTROL) 5 MG tablet Take 1 tablet (5 mg total) by mouth 2 (two) times daily before a meal. 60 tablet 2  . hydrALAZINE (APRESOLINE) 100 MG tablet Take 1 tablet (100 mg total) by mouth 3 (three) times daily. 270 tablet 3  . insulin glargine (LANTUS) 100 UNIT/ML injection Inject 20 Units into the skin daily.    . Insulin Pen Needle (B-D ULTRAFINE III SHORT PEN) 31G X 8 MM MISC 1 each by Does not apply route as directed. 100 each 3  . Multiple Vitamin (MULTIVITAMIN WITH MINERALS) TABS tablet Take 1 tablet by mouth daily.    Marland Kitchen OVER THE COUNTER MEDICATION Zinc  magnessium     No current facility-administered medications for this visit.    Past Medical History:  Diagnosis Date  . CAD (coronary artery disease) 03/03/2019  . Hypertension   . Type 2 diabetes mellitus with diabetic nephropathy ([place]) 04/23/2019    Past Surgical History:  Procedure Laterality Date  . BIOPSY  06/27/2019   Procedure: BIOPSY;  Surgeon: Rogene Houston,  MD;  [facility];  Service: Endoscopy;;  ascending colon polyp  . COLONOSCOPY N/A 06/27/2019   Procedure: COLONOSCOPY;  Surgeon: Rogene Houston, MD;  [facility];  Service: Endoscopy;  Laterality: N/A;  1030am-office rescheduled to 06/27/19 _0 :55am  . CORONARY ARTERY BYPASS GRAFT N/A 03/25/2019   Procedure: CORONARY ARTERY BYPASS GRAFTING (CABG) X  4 USING LEFT INTERNAL MAMMARY ARTERY AND RIGHT SAPHENOUS VEIN GRAFTS;  Surgeon: [name], MD;  Location: [facility];  Service: Open Heart Surgery;  Laterality: N/A;  . EXCISION MORTON'S NEUROMA    . POLYPECTOMY  06/27/2019   Procedure: POLYPECTOMY;  Surgeon: Rogene Houston, MD;   [facility];  Service: Endoscopy;;  proximal sigmoid colon  . RIGHT/LEFT HEART CATH AND CORONARY ANGIOGRAPHY N/A 03/12/2019   Procedure: RIGHT/LEFT HEART CATH AND CORONARY ANGIOGRAPHY;  Surgeon: [name], MD;  Location: [facility];  Service: Cardiovascular;  Laterality: N/A;  . TEE WITHOUT CARDIOVERSION N/A 03/25/2019   Procedure: TRANSESOPHAGEAL ECHOCARDIOGRAM (TEE);  Surgeon: [name], MD;  Location: [facility];  Service: Open Heart Surgery;  Laterality: N/A;    Social History   Socioeconomic History  . Marital status: Single    Spouse name: Not on file  . Number of children: Not on file  . Years of education: Not on file  . Highest education level: Not on file  Occupational History  . Not on file  Tobacco Use  . Smoking status: Former Smoker    Quit date: 07/06/1988    Years since quitting: 31.2  . Smokeless tobacco: Never Used  Substance and Sexual Activity  . Alcohol use: Not Currently    Comment: socially  . Drug use: No  . Sexual activity: Not on file  Other Topics Concern  . Not on file  Social History Narrative  . Not on file   Social Determinants of Health   Financial Resource Strain:   . Difficulty of Paying Living Expenses:   Food Insecurity:   . Worried About Charity fundraiser in the Last Year:   . Arboriculturist in the Last Year:   Transportation Needs:   . Film/video editor (Medical):   Marland Kitchen Lack of Transportation (Non-Medical):   Physical Activity:   . Days of Exercise per Week:   . Minutes of Exercise per Session:   Stress:   . Feeling of Stress :   Social Connections:   . Frequency of Communication with Friends and Family:   . Frequency of Social Gatherings with Friends and Family:   . Attends Religious Services:   . Active Member of Clubs or Organizations:   . Attends Archivist Meetings:   Marland Kitchen Marital Status:   Intimate Partner Violence:   . Fear of Current or Ex-Partner:   . Emotionally  Abused:   Marland Kitchen Physically Abused:   . Sexually Abused:      Vitals:   10/15/19 1353  BP: (!) 162/94  Pulse: 95  Temp: 98.8 F (37.1 C)  SpO2: 98%  Weight: 230 lb (104.3 kg)  Height: 5' 10" (1.778 m)    Wt Readings from Last 3 Encounters:  10/15/19 230 lb (104.3 kg)  10/02/19 227 lb 3.2 oz (103.1 kg)  09/12/19 230 lb (104.3 kg)     PHYSICAL EXAM General: NAD HEENT: Normal. Neck: No JVD, no thyromegaly. Lungs: Clear to auscultation bilaterally with normal respiratory effort. CV: Regular rate and rhythm, normal S1/S2, no S3/S4,  no murmur. No pretibial or periankle edema.   Abdomen: Soft, nontender, no distention.  Neurologic: Alert and oriented.  Psych: Normal affect. Skin: Normal. Musculoskeletal: No gross deformities.      Labs: Lab Results  Component Value Date/Time   K 4.3 08/26/2019 02:32 PM   BUN 36 (H) 08/26/2019 02:32 PM   BUN 23 04/23/2019 01:58 PM   CREATININE 2.62 (H) 08/26/2019 02:32 PM   CREATININE 1.94 (H) 05/13/2019 10:29 AM   ALT 20 03/21/2019 03:26 PM   TSH 2.110 04/29/2018 09:00 AM   HGB 11.8 (L) 08/21/2019 02:39 PM     Lipids: Lab Results  Component Value Date/Time   LDLCALC 77 12/31/2018 09:58 AM   CHOL 152 12/31/2018 09:58 AM   TRIG 172 (H) 12/31/2018 09:58 AM   HDL 41 12/31/2018 09:58 AM      Nuclear stress test 09/25/2019:   No diagnostic ST segment changes to indicate ischemia.  Medium sized, moderate intensity, apical to basal inferior perfusion defect that is fixed at the base, partially reversible at the apex, and most consistent with scar with mild inferoapical peri-infarct ischemia.  This is an intermediate risk study.  Nuclear stress EF: 35%. Consider confirmatory echocardiogram if not done recently.    Echocardiogram 10/07/2019:  left ventricle has normal function. The left ventricle has no regional  wall motion abnormalities. There is moderate left ventricular hypertrophy.  Left ventricular diastolic  parameters  are consistent with Grade I diastolic dysfunction (impaired  relaxation).  2. Right ventricular systolic function is normal. The right ventricular  size is normal.  3. Left atrial size was severely dilated.  4. The mitral valve is normal in structure. Trivial mitral valve  regurgitation. No evidence of mitral stenosis.  5. The aortic valve was not well visualized. Aortic valve regurgitation  is not visualized. No aortic stenosis is present.  6. The inferior vena cava is normal in size with greater than 50%  respiratory variability, suggesting right atrial pressure of 3 mmHg.    ASSESSMENT AND PLAN:  1.  Coronary artery disease: Mild inferoapical peri-infarct ischemia seen by nuclear stress test on 09/25/2019.  He underwent 5 vessel CABG on 03/25/2019 with a LIMA to the LAD, and SVG to the PLV, OM 2, and D1. LVEF 55 to 60% by echocardiogram on 10/07/2019. Continue aspirin.  He stopped carvedilol on his own.  2.  Hypertension: Blood pressure is elevated.  Currently on hydralazine.  I will start amlodipine 5 mg daily.  3.  Chronic kidney disease stage III: BUN 36 and creatinine 2.62 on 08/26/2019.  Diuretics on hold.  Followed by nephrology.  4.  Hyperlipidemia: Rosuvastatin stopped on 08/26/2019 due to diffuse body aches.  He may need PCSK9 inhibitors.  He said stopping rosuvastatin minimally helped to improve his symptoms.  5.  Chronic diastolic heart failure: He takes a diuretic as prescribed by his nephrologist.  6.  Dyspnea on exertion: It is possible some of his symptoms are due to severely elevated blood pressure as it gets up to the 200/100 range.  I will start amlodipine 5 mg daily.  I will also obtain a chest CT without contrast to investigate for pulmonary pathology and to ensure there has been no hemidiaphragmatic paralysis.   Disposition: Follow up 6 months   [name], M.D., F.A.C.C.

## 2019-10-15 NOTE — Patient Instructions (Signed)
Medication Instructions:  START Amlodipine  5 mg daily    Labwork: None today  Procedures/Testing: Schedule chest CT without contrast for SOB and cough   Follow-Up: 6 months office visit with Bernerd Pho, PA-C  Any Additional Special Instructions Will Be Listed Below (If Applicable).     If you need a refill on your cardiac medications before your next appointment, please call your pharmacy.     Thank you for choosing [facility] !

## 2019-10-18 ENCOUNTER — Telehealth: Payer: Self-pay | Admitting: Cardiovascular Disease

## 2019-10-18 NOTE — Telephone Encounter (Signed)
Pt could like to know if he can take coq10 and if he can't he would like to know why he can't  Please call [phone]

## 2019-10-21 NOTE — Telephone Encounter (Signed)
Patient notified

## 2019-10-21 NOTE — Telephone Encounter (Signed)
It's fine for him to do so but there are no data-driven benefits with respect to CV health.

## 2019-10-28 ENCOUNTER — Ambulatory Visit (INDEPENDENT_AMBULATORY_CARE_PROVIDER_SITE_OTHER): Payer: Medicaid Other | Admitting: Family Medicine

## 2019-10-28 ENCOUNTER — Telehealth: Payer: Self-pay | Admitting: Family Medicine

## 2019-10-28 ENCOUNTER — Other Ambulatory Visit: Payer: Self-pay

## 2019-10-28 ENCOUNTER — Encounter: Payer: Self-pay | Admitting: Family Medicine

## 2019-10-28 VITALS — BP 138/102 | HR 89 | Temp 98.3°F | Wt 226.2 lb

## 2019-10-28 DIAGNOSIS — M79604 Pain in right leg: Secondary | ICD-10-CM

## 2019-10-28 DIAGNOSIS — M25511 Pain in right shoulder: Secondary | ICD-10-CM | POA: Diagnosis not present

## 2019-10-28 MED ORDER — HYDROCODONE-ACETAMINOPHEN 5-325 MG PO TABS: 1.0000 | ORAL_TABLET | Freq: Four times a day (QID) | ORAL | 0 refills | Status: DC | PRN

## 2019-10-28 NOTE — Patient Instructions (Signed)

## 2019-10-28 NOTE — Telephone Encounter (Signed)
Please advise. Thank you

## 2019-10-28 NOTE — Progress Notes (Addendum)
Patient ID: JAGAR LUA, male    DOB: [birth year], 64 y.o.   MRN: [id number]   Chief Complaint  Patient presents with  . arm and leg pain   Subjective:    HPI  Pt seen with wife.  Patient comes in today with complaints of nerve pain shooting and radiating down right arm, hand and at times having rt leg pain also. Patient states "he has extreme weakness in both arms and legs at times."  At times both arms hurt, but now the right is hurting worse.  In chart had a left shoulder MRI ordered, pt hasn't gotten it yet. Has been hurting on right side since being seen 3/21.   Patient also complains of trouble sleeping because the pain wakes him up with rt shoulder.  No new injury/trauma to shoulder. Pt having pain and requesting something stronger for pain.  Pt not able to take NSAIDs due to ckd. Pt wondered if his J&J covid vaccine caused some of this pain, had it a few weeks ago in the rt deltoid.   Rt leg giving out due to pain, feels a shooting pain occ when going to get up quickly.  At times it feels leg giving out due to the severe pain.  Pain is intermittent and feels like a lightening or shocking pain down to the thigh. No new trauma, fall, or injury.   Medical History Sachit has a past medical history of CAD (coronary artery disease) (03/03/2019), Hypertension, and Type 2 diabetes mellitus with diabetic nephropathy ([facility]) (04/23/2019).   Outpatient Encounter Medications as of 10/28/2019  Medication Sig  . acetaminophen (TYLENOL) 500 MG tablet Take 1,000 mg by mouth every 6 (six) hours as needed for moderate pain or headache.  Marland Kitchen amLODipine (NORVASC) 5 MG tablet Take 1 tablet (5 mg total) by mouth daily.  Marland Kitchen aspirin EC 81 MG tablet Take 81 mg by mouth daily.  . blood glucose meter kit and supplies KIT 1 each by Does not apply route 2 (two) times daily. Dispense based on patient and insurance preference. Use two times daily as directed. (FOR ICD-10 E11.59).  . Boswellia-Glucosamine-Vit  D (OSTEO BI-FLEX ONE PER DAY PO) Take 1 tablet by mouth daily.  . Cholecalciferol (VITAMIN D-3) 125 MCG (5000 UT) TABS Take 5,000 Units by mouth daily.  . Cyanocobalamin (B-12) 2500 MCG TABS Take 2,500 mcg by mouth daily.  . diphenhydramine-acetaminophen (TYLENOL PM) 25-500 MG TABS tablet Take 2 tablets by mouth at bedtime as needed (sleep).   Marland Kitchen glipiZIDE (GLUCOTROL) 5 MG tablet Take 1 tablet (5 mg total) by mouth 2 (two) times daily before a meal.  . hydrALAZINE (APRESOLINE) 100 MG tablet Take 1 tablet (100 mg total) by mouth 3 (three) times daily.  Marland Kitchen HYDROcodone-acetaminophen (NORCO/VICODIN) 5-325 MG tablet Take 1 tablet by mouth every 6 (six) hours as needed for moderate pain.  Marland Kitchen insulin glargine (LANTUS) 100 UNIT/ML injection Inject 20 Units into the skin daily.  . Insulin Pen Needle (B-D ULTRAFINE III SHORT PEN) 31G X 8 MM MISC 1 each by Does not apply route as directed.  . Multiple Vitamin (MULTIVITAMIN WITH MINERALS) TABS tablet Take 1 tablet by mouth daily.  Marland Kitchen OVER THE COUNTER MEDICATION Zinc  magnessium  Coq10 Osteobiflex  . [DISCONTINUED] Ascorbic Acid (VITAMIN C) 1000 MG tablet Take 1,000 mg by mouth daily.   No facility-administered encounter medications on file as of 10/28/2019.     Review of Systems  Constitutional: Negative for fever.  Respiratory: Negative  for cough, shortness of breath and wheezing.   Cardiovascular: Negative for chest pain, palpitations and leg swelling.  Musculoskeletal: Positive for joint swelling (rt shoulder/deltoid pain). Negative for back pain, neck pain and neck stiffness.       +Rt upper thigh pain  Skin: Negative for rash and wound.  Neurological: Negative for speech difficulty and weakness.  All other systems reviewed and are negative.    Vitals BP (!) 138/102   Pulse 89   Temp 98.3 F (36.8 C)   Wt 226 lb 3.2 oz (102.6 kg)   SpO2 98%   BMI 32.46 kg/m   Objective:   Physical Exam Vitals and nursing note reviewed.    Constitutional:      General: He is not in acute distress.    Appearance: Normal appearance. He is not ill-appearing or toxic-appearing.  HENT:     Head: Normocephalic.     Nose: Nose normal. No congestion.     Mouth/Throat:     Mouth: Mucous membranes are moist.     Pharynx: No oropharyngeal exudate.  Eyes:     Extraocular Movements: Extraocular movements intact.     Conjunctiva/sclera: Conjunctivae normal.     Pupils: Pupils are equal, round, and reactive to light.  Cardiovascular:     Rate and Rhythm: Normal rate and regular rhythm.  Pulmonary:     Effort: Pulmonary effort is normal. No respiratory distress.  Musculoskeletal:        General: Tenderness (+hawkins and neer's on right shoulder.  neg for bicep tendon pain. dec rom with abdcution. no erythema warmth or swelling) present. No swelling, deformity or signs of injury. Normal range of motion.     Cervical back: Normal range of motion and neck supple. No tenderness.     Right lower leg: No edema.     Left lower leg: No edema.  Skin:    General: Skin is warm and dry.     Findings: No rash.  Neurological:     General: No focal deficit present.     Mental Status: He is alert and oriented to person, place, and time.     Cranial Nerves: No cranial nerve deficit.     Sensory: No sensory deficit.     Motor: No weakness.  Psychiatric:        Mood and Affect: Mood normal.        Behavior: Behavior normal.      Assessment and Plan   1. Acute pain of right shoulder - MR Shoulder Right Wo Contrast; Future - HYDROcodone-acetaminophen (NORCO/VICODIN) 5-325 MG tablet; Take 1 tablet by mouth every 6 (six) hours as needed for moderate pain.  Dispense: 15 tablet; Refill: 0  2. Right leg pain - DG Lumbar Spine Complete; Future   -gave small course of norco for pain at night.   pt unable to take nsaids due to ckd. Advising Tylenol during the day. Rest and ice shoulder prn. MRI- ordered for rt shoulder pain. For rt leg pain-  likely lumbar radiculopathy- ordering lumbar xray.  Tylenol prn.  F/u with pcp after getting MRI.  Pt in agreement.

## 2019-10-28 NOTE — Telephone Encounter (Signed)
Pharmacy contacting checking on pain medication.

## 2019-10-28 NOTE — Telephone Encounter (Signed)
Yes, sorry forgot to send earlier, sent over around 2-3pm. Thx.   Dr. Lovena Le

## 2019-10-29 ENCOUNTER — Ambulatory Visit (HOSPITAL_COMMUNITY): Payer: Medicaid Other

## 2019-10-29 NOTE — Telephone Encounter (Signed)
Pt contacted and states that he was able to pick up medication yesterday evening.

## 2019-10-31 DIAGNOSIS — H4313 Vitreous hemorrhage, bilateral: Secondary | ICD-10-CM | POA: Diagnosis not present

## 2019-10-31 DIAGNOSIS — E113511 Type 2 diabetes mellitus with proliferative diabetic retinopathy with macular edema, right eye: Secondary | ICD-10-CM | POA: Diagnosis not present

## 2019-10-31 DIAGNOSIS — H25813 Combined forms of age-related cataract, bilateral: Secondary | ICD-10-CM | POA: Diagnosis not present

## 2019-11-01 ENCOUNTER — Other Ambulatory Visit: Payer: Self-pay | Admitting: Family Medicine

## 2019-11-06 ENCOUNTER — Telehealth: Payer: Self-pay

## 2019-11-06 DIAGNOSIS — R0600 Dyspnea, unspecified: Secondary | ICD-10-CM

## 2019-11-06 DIAGNOSIS — R0609 Other forms of dyspnea: Secondary | ICD-10-CM

## 2019-11-06 NOTE — Telephone Encounter (Signed)
-----   Message from Herminio Commons, MD sent at 11/05/2019  8:50 AM EDT ----- Can get xray first ----- Message ----- From: Bernita Raisin, RN Sent: 11/05/2019   7:48 AM EDT To: Herminio Commons, MD   ----- Message ----- From: Ciro Backer Sent: 11/04/2019   2:45 PM EDT To: Bernita Raisin, RN  Good afternoon,  I have a chest ct w/o contrast that is pending denial with [place] Medicaid. The MD was unable to approve because medicaid guidelines state that the patient needs to have a chest x ray within the last 60 days before they approve of a chest ct.   Do you know if [name] has time for a peer to peer prior to the patient's 6/1 appt? Or do we need to cancel the chest ct for now until the xray can be completed?  Thanks so much for your help.   Estill Bamberg  ----- Message ----- From: Ciro Backer Sent: 10/24/2019   3:58 PM EDT To: Ciro Backer   ----- Message ----- From: Desma Paganini Sent: 10/24/2019  11:19 AM EDT To: [facility] Pre Cert/Auth  Scheduling for CT Chest @ [facility]  [place] Medicaid

## 2019-11-06 NOTE — Telephone Encounter (Signed)
CXR order placed, left message for pt to call back

## 2019-11-07 ENCOUNTER — Telehealth: Payer: Self-pay | Admitting: Family Medicine

## 2019-11-07 NOTE — Telephone Encounter (Signed)
Ok, I forgot to add in the note it's been hurting for months,and seen by pcp in 3/21. For bilateral shoulder pain and had xray at that time.  So it's been hurting more since last 2 months.  Is that enough to qualify for the mri?  Thx,   Dr. Darene Lamer

## 2019-11-07 NOTE — Telephone Encounter (Signed)
MRI was DENIED by insurance due to criteria not met  Pt must have a recent (within 3 months) 6 week trial of doctor prescribed treatment that failed   Please adivse 

## 2019-11-08 ENCOUNTER — Telehealth: Payer: Self-pay | Admitting: Cardiovascular Disease

## 2019-11-08 ENCOUNTER — Telehealth: Payer: Self-pay | Admitting: *Deleted

## 2019-11-08 ENCOUNTER — Telehealth: Payer: Self-pay | Admitting: Family Medicine

## 2019-11-08 DIAGNOSIS — M25511 Pain in right shoulder: Secondary | ICD-10-CM

## 2019-11-08 MED ORDER — HYDROCODONE-ACETAMINOPHEN 5-325 MG PO TABS: 1.0000 | ORAL_TABLET | Freq: Four times a day (QID) | ORAL | 0 refills | Status: DC | PRN

## 2019-11-08 NOTE — Telephone Encounter (Signed)
Dr.Koneswaran says to cancel chest CT and get CXR instead. Alejandro Mulling notified    I had to leave another message for patient. He needs a chest x-ray now.

## 2019-11-08 NOTE — Telephone Encounter (Signed)
[name] Is calling to schedule a pier to [employer] for a Market researcher to call and speak with the Dr, NP, or PA so they can get this pt can be scheduled for CT Chest--   Today is the last day for this.   [phone] OPT 4   Case # [id number]

## 2019-11-08 NOTE — Telephone Encounter (Signed)
Lmtc

## 2019-11-08 NOTE — Telephone Encounter (Signed)
Pt needs to f/u with [name] for further refills on pain medications.  Seen as urgent care visit and has been having this chronic pain in shoulders/neck for over 1 yr.  Pls have pt f/u with [name] for the pain/and MRI.   Thx,   Dr. Lovena Le

## 2019-11-08 NOTE — Telephone Encounter (Signed)
Patient called back, his will get CXR on Tuesday 11/12/19

## 2019-11-08 NOTE — Telephone Encounter (Signed)
6 May pend refill on hydrocodone 15 tablets

## 2019-11-08 NOTE — Telephone Encounter (Signed)
Medication was sent in   

## 2019-11-08 NOTE — Telephone Encounter (Signed)
Pt contacted and verbalized understanding.  

## 2019-11-08 NOTE — Telephone Encounter (Signed)
Med pended and ready to sign 

## 2019-11-08 NOTE — Telephone Encounter (Signed)
I spoke with patient,he will get CXR Tuesday 11/12/19

## 2019-11-08 NOTE — Telephone Encounter (Signed)
Pt reached out to insurance company to see what he could do about mri that got denied and insurance called [place] to schedule a time to do appeal on the phone for next Tuesday. If this is something you want to do please let me know what time to schedule for.  They are there til 9pm [phone] option 4 Case number [id number]

## 2019-11-08 NOTE — Telephone Encounter (Signed)
Pt would like to know if he may have a refill on hydrocodone for radiating bilateral arm and shoulder pain.   Hyampom.   Also wants to check on status of having MRI done. Medicaid had denied it.   He also wanted to thank Dr. Lovena Le for helping and trying to get issue solved.

## 2019-11-08 NOTE — Telephone Encounter (Signed)
Nurses 1.  Insurance companies have not very much changed the process for getting MRIs approved  2.  Typically and where shoulder MRIs a person would need to fail cortisone injection as well as physical therapy to be able to get the MRI Typically in a case like this I would recommend we set the patient up with orthopedics (in other words typically I would refer patient to orthopedics at this point )and they would try the possibility of injection and physical therapy-then if the patient is having ongoing trouble despite those interventions then the insurance company will approve the MRI  If the patient does want [place] to do an appeal I will but more than likely it will be denied because he has not had an injection in his shoulder or physical therapy Talk with the patient see what he would like for [place] to do

## 2019-11-12 ENCOUNTER — Other Ambulatory Visit (HOSPITAL_COMMUNITY): Payer: Self-pay | Admitting: Cardiovascular Disease

## 2019-11-12 ENCOUNTER — Ambulatory Visit (HOSPITAL_COMMUNITY): Payer: Medicaid Other

## 2019-11-12 ENCOUNTER — Ambulatory Visit (HOSPITAL_COMMUNITY)
Admission: RE | Admit: 2019-11-12 | Discharge: 2019-11-12 | Disposition: A | Discharge status: Home / Self Care | Payer: Medicaid Other | Source: Ambulatory Visit | Attending: Cardiovascular Disease | Admitting: Cardiovascular Disease

## 2019-11-12 DIAGNOSIS — R0609 Other forms of dyspnea: Secondary | ICD-10-CM

## 2019-11-12 DIAGNOSIS — R0600 Dyspnea, unspecified: Secondary | ICD-10-CM | POA: Diagnosis not present

## 2019-11-13 NOTE — Telephone Encounter (Signed)
Patient is fine with being referred to orthopedics but does prefer to be referred to someone in [place].

## 2019-11-14 ENCOUNTER — Other Ambulatory Visit: Payer: Self-pay | Admitting: *Deleted

## 2019-11-14 DIAGNOSIS — M25511 Pain in right shoulder: Secondary | ICD-10-CM

## 2019-11-14 NOTE — Telephone Encounter (Signed)
I recommend [facility]

## 2019-11-18 NOTE — Telephone Encounter (Signed)
At this point we could refer to ortho or order 6 weeks of  PT & meds then re-eval

## 2019-11-18 NOTE — Telephone Encounter (Signed)
Check with [name], I think he referred to ortho.  Thx. Dr. Lovena Le

## 2019-11-20 DIAGNOSIS — E113511 Type 2 diabetes mellitus with proliferative diabetic retinopathy with macular edema, right eye: Secondary | ICD-10-CM | POA: Diagnosis not present

## 2019-11-20 DIAGNOSIS — H2513 Age-related nuclear cataract, bilateral: Secondary | ICD-10-CM | POA: Diagnosis not present

## 2019-11-20 DIAGNOSIS — E113592 Type 2 diabetes mellitus with proliferative diabetic retinopathy without macular edema, left eye: Secondary | ICD-10-CM | POA: Diagnosis not present

## 2019-11-20 DIAGNOSIS — H4312 Vitreous hemorrhage, left eye: Secondary | ICD-10-CM | POA: Diagnosis not present

## 2019-11-26 DIAGNOSIS — E1122 Type 2 diabetes mellitus with diabetic chronic kidney disease: Secondary | ICD-10-CM | POA: Diagnosis not present

## 2019-11-26 DIAGNOSIS — I129 Hypertensive chronic kidney disease with stage 1 through stage 4 chronic kidney disease, or unspecified chronic kidney disease: Secondary | ICD-10-CM | POA: Diagnosis not present

## 2019-11-26 DIAGNOSIS — R5383 Other fatigue: Secondary | ICD-10-CM | POA: Diagnosis not present

## 2019-11-26 DIAGNOSIS — N1832 Chronic kidney disease, stage 3b: Secondary | ICD-10-CM | POA: Diagnosis not present

## 2019-11-28 ENCOUNTER — Encounter: Payer: Self-pay | Admitting: Family Medicine

## 2019-12-06 ENCOUNTER — Ambulatory Visit: Payer: Medicaid Other | Admitting: Student

## 2019-12-10 ENCOUNTER — Encounter: Payer: Self-pay | Admitting: Endocrinology

## 2019-12-10 ENCOUNTER — Other Ambulatory Visit: Payer: Self-pay

## 2019-12-10 ENCOUNTER — Ambulatory Visit (INDEPENDENT_AMBULATORY_CARE_PROVIDER_SITE_OTHER): Payer: Medicaid Other | Admitting: Endocrinology

## 2019-12-10 VITALS — BP 170/120 | HR 86 | Ht 70.0 in | Wt 219.2 lb

## 2019-12-10 DIAGNOSIS — E1159 Type 2 diabetes mellitus with other circulatory complications: Secondary | ICD-10-CM | POA: Diagnosis not present

## 2019-12-10 DIAGNOSIS — M25511 Pain in right shoulder: Secondary | ICD-10-CM | POA: Diagnosis not present

## 2019-12-10 DIAGNOSIS — M25512 Pain in left shoulder: Secondary | ICD-10-CM | POA: Diagnosis not present

## 2019-12-10 LAB — POCT GLYCOSYLATED HEMOGLOBIN (HGB A1C): Hemoglobin A1C: 7.1 % — AB (ref 4.0–5.6)

## 2019-12-10 MED ORDER — LANTUS SOLOSTAR 100 UNIT/ML ~~LOC~~ SOPN: 25.0000 [IU] | PEN_INJECTOR | SUBCUTANEOUS | 99 refills | Status: DC

## 2019-12-10 MED ORDER — GLIPIZIDE 5 MG PO TABS: 2.5000 mg | ORAL_TABLET | Freq: Every day | ORAL | 3 refills | Status: DC

## 2019-12-10 NOTE — Progress Notes (Signed)
Subjective:    Patient ID: [name], male    DOB: [birth year], 64 y.o.   MRN: [id number]  HPI pt is referred by Dr Wolfgang Phoenix, for diabetes.  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  he has been on insulin since 2020; pt says his diet and exercise are fair; he has never had pancreatitis, pancreatic surgery, severe hypoglycemia or DKA.  He takes Lantus 20/d, and glipizide.  He stopped BP rx 1 month ago.  Pt says cbg varies from 85-140.   Past Medical History:  Diagnosis Date  . CAD (coronary artery disease) 03/03/2019  . Hypertension   . Type 2 diabetes mellitus with diabetic nephropathy ([place]) 04/23/2019    Past Surgical History:  Procedure Laterality Date  . BIOPSY  06/27/2019   Procedure: BIOPSY;  Surgeon: Rogene Houston, MD;  [facility]: [facility];  Service: Endoscopy;;  ascending colon polyp  . COLONOSCOPY N/A 06/27/2019   Procedure: COLONOSCOPY;  Surgeon: Rogene Houston, MD;  [facility]: [facility];  Service: Endoscopy;  Laterality: N/A;  1030am-office rescheduled to 06/27/19 @9 :55am  . CORONARY ARTERY BYPASS GRAFT N/A 03/25/2019   Procedure: CORONARY ARTERY BYPASS GRAFTING (CABG) X  4 USING LEFT INTERNAL MAMMARY ARTERY AND RIGHT SAPHENOUS VEIN GRAFTS;  Surgeon: [name], MD;  Location: [place];  Service: Open Heart Surgery;  Laterality: N/A;  . EXCISION MORTON'S NEUROMA    . POLYPECTOMY  06/27/2019   Procedure: POLYPECTOMY;  Surgeon: Rogene Houston, MD;  Location: [facility];  Service: Endoscopy;;  proximal sigmoid colon  . RIGHT/LEFT HEART CATH AND CORONARY ANGIOGRAPHY N/A 03/12/2019   Procedure: RIGHT/LEFT HEART CATH AND CORONARY ANGIOGRAPHY;  Surgeon: [name], MD;  Location: [facility];  Service: Cardiovascular;  Laterality: N/A;  . TEE WITHOUT CARDIOVERSION N/A 03/25/2019   Procedure: TRANSESOPHAGEAL ECHOCARDIOGRAM (TEE);  Surgeon: [name], MD;  Location: [facility];  Service: Open Heart  Surgery;  Laterality: N/A;    Social History   Socioeconomic History  . Marital status: Single    Spouse name: Not on file  . Number of children: Not on file  . Years of education: Not on file  . Highest education level: Not on file  Occupational History  . Not on file  Tobacco Use  . Smoking status: Former Smoker    Quit date: 07/06/1988    Years since quitting: 31.4  . Smokeless tobacco: Never Used  Vaping Use  . Vaping Use: Never used  Substance and Sexual Activity  . Alcohol use: Not Currently    Comment: socially  . Drug use: No  . Sexual activity: Not on file  Other Topics Concern  . Not on file  Social History Narrative  . Not on file   Social Determinants of Health   Financial Resource Strain:   . Difficulty of Paying Living Expenses:   Food Insecurity:   . Worried About Charity fundraiser in the Last Year:   . Arboriculturist in the Last Year:   Transportation Needs:   . Film/video editor (Medical):   Marland Kitchen Lack of Transportation (Non-Medical):   Physical Activity:   . Days of Exercise per Week:   . Minutes of Exercise per Session:   Stress:   . Feeling of Stress :   Social Connections:   . Frequency of Communication with Friends and Family:   . Frequency of Social  Gatherings with Friends and Family:   . Attends Religious Services:   . Active Member of Clubs or Organizations:   . Attends Archivist Meetings:   Marland Kitchen Marital Status:   Intimate Partner Violence:   . Fear of Current or Ex-Partner:   . Emotionally Abused:   Marland Kitchen Physically Abused:   . Sexually Abused:     Current Outpatient Medications on File Prior to Visit  Medication Sig Dispense Refill  . acetaminophen (TYLENOL) 500 MG tablet Take 1,000 mg by mouth every 6 (six) hours as needed for moderate pain or headache.    Marland Kitchen aspirin EC 81 MG tablet Take 81 mg by mouth daily.    . [place]-Glucosamine-Vit D (OSTEO BI-FLEX ONE PER DAY PO) Take 1 tablet by mouth daily.    .  Cholecalciferol (VITAMIN D-3) 125 MCG (5000 UT) TABS Take 5,000 Units by mouth daily.    . Coenzyme Q10 (COQ10 PO) Take 1 tablet by mouth daily.    . Cyanocobalamin (B-12) 2500 MCG TABS Take 2,500 mcg by mouth daily.    . furosemide (LASIX) 40 MG tablet Take 40 mg by mouth daily.    . hydrALAZINE (APRESOLINE) 100 MG tablet Take 1 tablet (100 mg total) by mouth 3 (three) times daily. 270 tablet 3  . HYDROcodone-acetaminophen (NORCO/VICODIN) 5-325 MG tablet Take 1 tablet by mouth every 6 (six) hours as needed for moderate pain. 15 tablet 0  . Insulin Pen Needle (B-D ULTRAFINE III SHORT PEN) 31G X 8 MM MISC 1 each by Does not apply route as directed. 100 each 3  . Magnesium 100 MG CAPS Take 1 capsule by mouth daily.    . Multiple Vitamin (MULTIVITAMIN WITH MINERALS) TABS tablet Take 1 tablet by mouth daily.    . Multiple Vitamins-Minerals (ZINC PO) Take 1 tablet by mouth daily.    . sertraline (ZOLOFT) 100 MG tablet Take 100 mg by mouth daily.     No current facility-administered medications on file prior to visit.    Allergies  Allergen Reactions  . Bee Venom Anaphylaxis    Family History  Problem Relation Age of Onset  . Cancer Mother   . Heart Problems Father   . Diabetes Father    BP (!) 170/120   Pulse 86   Ht 5\' 10"  (1.778 m)   Wt 219 lb 3.2 oz (99.4 kg)   SpO2 96%   BMI 31.45 kg/m   Review of Systems denies chest pain, sob, n/v, hypoglycemia, and memory loss.  He has regained weight, since on the insulin.  No change in chronic visual loss.  Depression is well-controlled.     Objective:   Physical Exam VS: see vs page GEN: no distress HEAD: head: no deformity eyes: no periorbital swelling, no proptosis external nose and ears are normal NECK: supple, thyroid is not enlarged CHEST WALL: no deformity.  Old healed surgical scar (median sternotomy) LUNGS: clear to auscultation CV: reg rate and rhythm, no murmur MUSCULOSKELETAL: muscle bulk and strength are grossly  normal.  no obvious joint swelling.  gait is normal and steady.  EXTEMITIES: no deformity.  no ulcer on the feet.  feet are of normal color and temp.  no edema PULSES: dorsalis pedis intact bilat.  no carotid bruit NEURO:  cn 2-12 grossly intact.   readily moves all 4's.  sensation is intact to touch on the feet SKIN:  Normal texture and temperature.  No rash or suspicious lesion is visible.   NODES:  None  palpable at the neck PSYCH: alert, well-oriented.  Does not appear anxious nor depressed.    Lab Results  Component Value Date   HGBA1C 7.1 (A) 12/10/2019   I have reviewed outside records, and summarized: Pt was noted to have elevated A1c, and referred here.  He was seen by Dr Dorris Fetch until recently.  He saw Dr Wolfgang Phoenix for mult other problems.       Assessment & Plan:  Insulin-requiring type 2 DM, new to me.  The glipizide is redundant with the insulin.   HTN: therapy limited by noncompliance.     Patient Instructions  Your blood pressure is high today.  Please see your primary care provider, or heart doctor soon, to have it rechecked. good diet and exercise significantly improve the control of your diabetes.  please let me know if you wish to be referred to a dietician.  high blood sugar is very risky to your health.  you should see an eye doctor and dentist every year.  It is very important to get all recommended vaccinations.  Controlling your blood pressure and cholesterol drastically reduces the damage diabetes does to your body.  Those who smoke should quit.  Please discuss these with your doctor.  check your blood sugar twice a day.  vary the time of day when you check, between before the 3 meals, and at bedtime.  also check if you have symptoms of your blood sugar being too high or too low.  please keep a record of the readings and bring it to your next appointment here (or you can bring the meter itself).  You can write it on any piece of paper.  please call us sooner if your blood  sugar goes below 70, or if you have a lot of readings over 200. We will need to take this complex situation in stages.   For now, please: Reduce the glipizide to 1/2 pill each morning, and: Increase the insulin to 25 units each morning. Please come back for a follow-up appointment in 2 months.

## 2019-12-10 NOTE — Patient Instructions (Addendum)
Your blood pressure is high today.  Please see your primary care provider, or heart doctor soon, to have it rechecked. good diet and exercise significantly improve the control of your diabetes.  please let me know if you wish to be referred to a dietician.  high blood sugar is very risky to your health.  you should see an eye doctor and dentist every year.  It is very important to get all recommended vaccinations.  Controlling your blood pressure and cholesterol drastically reduces the damage diabetes does to your body.  Those who smoke should quit.  Please discuss these with your doctor.  check your blood sugar twice a day.  vary the time of day when you check, between before the 3 meals, and at bedtime.  also check if you have symptoms of your blood sugar being too high or too low.  please keep a record of the readings and bring it to your next appointment here (or you can bring the meter itself).  You can write it on any piece of paper.  please call us sooner if your blood sugar goes below 70, or if you have a lot of readings over 200. We will need to take this complex situation in stages.   For now, please: Reduce the glipizide to 1/2 pill each morning, and: Increase the insulin to 25 units each morning. Please come back for a follow-up appointment in 2 months.

## 2019-12-11 ENCOUNTER — Telehealth: Payer: Self-pay | Admitting: Endocrinology

## 2019-12-11 NOTE — Telephone Encounter (Signed)
Patient's wife cancelled appt for 02/10/20 and stated that Patient will no longer be seeing Dr. Loanne Drilling for treatment and that Patient will get treatment from a different Dr. that he has been seeing.

## 2019-12-11 NOTE — Telephone Encounter (Signed)
FYI

## 2019-12-12 DIAGNOSIS — E113511 Type 2 diabetes mellitus with proliferative diabetic retinopathy with macular edema, right eye: Secondary | ICD-10-CM | POA: Diagnosis not present

## 2019-12-18 DIAGNOSIS — M25512 Pain in left shoulder: Secondary | ICD-10-CM | POA: Diagnosis not present

## 2019-12-18 DIAGNOSIS — M7542 Impingement syndrome of left shoulder: Secondary | ICD-10-CM | POA: Diagnosis not present

## 2019-12-23 ENCOUNTER — Telehealth: Payer: Self-pay | Admitting: Family Medicine

## 2019-12-23 NOTE — Telephone Encounter (Signed)
Ms.Moore (on DPR) contacted. Ms. Laurance Flatten wanted to speak with Dr.Taylor regarding what Dr.Beane did. Pt has appt on 7/14 and will discuss with provider what went on at that time.

## 2019-12-23 NOTE — Telephone Encounter (Signed)
[name] called and wanted to discuss the referral to the orthopedic Dr. Tonita Cong   Ms Laurance Flatten  call back [phone]

## 2019-12-24 ENCOUNTER — Ambulatory Visit: Payer: Medicaid Other | Admitting: "Endocrinology

## 2019-12-24 ENCOUNTER — Other Ambulatory Visit: Payer: Self-pay

## 2019-12-24 ENCOUNTER — Ambulatory Visit: Payer: Medicaid Other | Admitting: Nutrition

## 2019-12-24 ENCOUNTER — Ambulatory Visit (INDEPENDENT_AMBULATORY_CARE_PROVIDER_SITE_OTHER): Payer: Medicaid Other | Admitting: "Endocrinology

## 2019-12-24 ENCOUNTER — Encounter: Payer: Self-pay | Admitting: "Endocrinology

## 2019-12-24 VITALS — BP 154/96 | HR 85 | Ht 70.0 in | Wt 217.8 lb

## 2019-12-24 DIAGNOSIS — E1159 Type 2 diabetes mellitus with other circulatory complications: Secondary | ICD-10-CM

## 2019-12-24 DIAGNOSIS — I10 Essential (primary) hypertension: Secondary | ICD-10-CM

## 2019-12-24 DIAGNOSIS — E782 Mixed hyperlipidemia: Secondary | ICD-10-CM

## 2019-12-24 LAB — POCT GLYCOSYLATED HEMOGLOBIN (HGB A1C): Hemoglobin A1C: 7.2 % — AB (ref 4.0–5.6)

## 2019-12-24 NOTE — Patient Instructions (Signed)

## 2019-12-24 NOTE — Progress Notes (Signed)
12/24/2019, 9:53 AM  Endocrinology follow-up note   Subjective:    Patient ID: [name], male    DOB: 1955-09-01.  [name] is being seen in follow-up after he was seen in consultation for management of currently uncontrolled symptomatic diabetes requested by  [name], MD.   Past Medical History:  Diagnosis Date  . CAD (coronary artery disease) 03/03/2019  . Hypertension   . Type 2 diabetes mellitus with diabetic nephropathy ([place]) 04/23/2019    Past Surgical History:  Procedure Laterality Date  . BIOPSY  06/27/2019   Procedure: BIOPSY;  Surgeon: Rogene Houston, MD;  [facility];  Service: Endoscopy;;  ascending colon polyp  . COLONOSCOPY N/A 06/27/2019   Procedure: COLONOSCOPY;  Surgeon: Rogene Houston, MD;  [facility];  Service: Endoscopy;  Laterality: N/A;  1030am-office rescheduled to 06/27/19 @9 :55am  . CORONARY ARTERY BYPASS GRAFT N/A 03/25/2019   Procedure: CORONARY ARTERY BYPASS GRAFTING (CABG) X  4 USING LEFT INTERNAL MAMMARY ARTERY AND RIGHT SAPHENOUS VEIN GRAFTS;  Surgeon: [name], MD;  Location: [facility];  Service: Open Heart Surgery;  Laterality: N/A;  . EXCISION MORTON'S NEUROMA    . POLYPECTOMY  06/27/2019   Procedure: POLYPECTOMY;  Surgeon: Rogene Houston, MD;  [facility];  Service: Endoscopy;;  proximal sigmoid colon  . RIGHT/LEFT HEART CATH AND CORONARY ANGIOGRAPHY N/A 03/12/2019   Procedure: RIGHT/LEFT HEART CATH AND CORONARY ANGIOGRAPHY;  Surgeon: [name], MD;  Location: [facility];  Service: Cardiovascular;  Laterality: N/A;  . TEE WITHOUT CARDIOVERSION N/A 03/25/2019   Procedure: TRANSESOPHAGEAL ECHOCARDIOGRAM (TEE);  Surgeon: [name], MD;  Location: [facility];  Service: Open Heart Surgery;  Laterality: N/A;    Social History   Socioeconomic History  . Marital status: Single     Spouse name: Not on file  . Number of children: Not on file  . Years of education: Not on file  . Highest education level: Not on file  Occupational History  . Not on file  Tobacco Use  . Smoking status: Former Smoker    Quit date: 07/06/1988    Years since quitting: 31.4  . Smokeless tobacco: Never Used  Vaping Use  . Vaping Use: Never used  Substance and Sexual Activity  . Alcohol use: Not Currently    Comment: socially  . Drug use: No  . Sexual activity: Not on file  Other Topics Concern  . Not on file  Social History Narrative  . Not on file   Social Determinants of Health   Financial Resource Strain:   . Difficulty of Paying Living Expenses:   Food Insecurity:   . Worried About Charity fundraiser in the Last Year:   . Arboriculturist in the Last Year:   Transportation Needs:   . Film/video editor (Medical):   Marland Kitchen Lack of Transportation (Non-Medical):   Physical Activity:   . Days of Exercise per Week:   . Minutes of Exercise per Session:   Stress:   . Feeling of Stress :   Social Connections:   . Frequency of Communication with  Friends and Family:   . Frequency of Social Gatherings with Friends and Family:   . Attends Religious Services:   . Active Member of Clubs or Organizations:   . Attends Archivist Meetings:   Marland Kitchen Marital Status:     Family History  Problem Relation Age of Onset  . Cancer Mother   . Heart Problems Father   . Diabetes Father     Outpatient Encounter Medications as of 12/24/2019  Medication Sig  . acetaminophen (TYLENOL) 500 MG tablet Take 1,000 mg by mouth every 6 (six) hours as needed for moderate pain or headache.  Marland Kitchen aspirin EC 81 MG tablet Take 81 mg by mouth daily.  . [place]-Glucosamine-Vit D (OSTEO BI-FLEX ONE PER DAY PO) Take 1 tablet by mouth daily.  . Cholecalciferol (VITAMIN D-3) 125 MCG (5000 UT) TABS Take 5,000 Units by mouth daily.  . Coenzyme Q10 (COQ10 PO) Take 1 tablet by mouth daily.  . Cyanocobalamin  (B-12) 2500 MCG TABS Take 2,500 mcg by mouth daily.  . furosemide (LASIX) 40 MG tablet Take 40 mg by mouth daily.  Marland Kitchen glipiZIDE (GLUCOTROL) 5 MG tablet Take 0.5 tablets (2.5 mg total) by mouth daily before breakfast.  . hydrALAZINE (APRESOLINE) 100 MG tablet Take 1 tablet (100 mg total) by mouth 3 (three) times daily.  Marland Kitchen HYDROcodone-acetaminophen (NORCO/VICODIN) 5-325 MG tablet Take 1 tablet by mouth every 6 (six) hours as needed for moderate pain.  Marland Kitchen insulin glargine (LANTUS SOLOSTAR) 100 UNIT/ML Solostar Pen Inject 25 Units into the skin every morning.  . Insulin Pen Needle (B-D ULTRAFINE III SHORT PEN) 31G X 8 MM MISC 1 each by Does not apply route as directed.  . Magnesium 100 MG CAPS Take 1 capsule by mouth daily.  . Multiple Vitamin (MULTIVITAMIN WITH MINERALS) TABS tablet Take 1 tablet by mouth daily.  . Multiple Vitamins-Minerals (ZINC PO) Take 1 tablet by mouth daily.  . sertraline (ZOLOFT) 100 MG tablet Take 100 mg by mouth daily.   No facility-administered encounter medications on file as of 12/24/2019.    ALLERGIES: Allergies  Allergen Reactions  . Bee Venom Anaphylaxis    VACCINATION STATUS: Immunization History  Administered Date(s) Administered  . Janssen (J&J) SARS-COV-2 Vaccination 09/21/2019  . Pneumococcal Polysaccharide-23 07/16/2018  . Zoster Recombinat (Shingrix) 04/25/2019    Diabetes He presents for his follow-up diabetic visit. He has type 2 diabetes mellitus. Onset time: He was diagnosed at approximate age of 59 years. His disease course has been improving. There are no hypoglycemic associated symptoms. Pertinent negatives for hypoglycemia include no confusion, headaches, pallor or seizures. Pertinent negatives for diabetes include no chest pain, no fatigue, no polydipsia, no polyphagia, no polyuria and no weakness. There are no hypoglycemic complications. Symptoms are improving. Diabetic complications include heart disease and nephropathy. (Patient is a status  post quadruple bypass surgery in October 2019.) Risk factors for coronary artery disease include dyslipidemia, diabetes mellitus, family history, obesity, hypertension, male sex, tobacco exposure and sedentary lifestyle. Current diabetic treatments: Is currently on glipizide 10 mg p.o. twice daily. His weight is decreasing steadily. He is following a generally unhealthy diet. When asked about meal planning, he reported none. He has had a previous visit with a dietitian. He participates in exercise intermittently. His home blood glucose trend is increasing steadily. His breakfast blood glucose range is generally 130-140 mg/dl. His bedtime blood glucose range is generally 140-180 mg/dl. His overall blood glucose range is 140-180 mg/dl. (He did not bring his meter nor logs  to review.  He reports that his glucose readings range from 100-150, point-of-care A1c is 7.2% progressively improving from 8.2%.  He denies any major hypoglycemia.  ) An ACE inhibitor/angiotensin II receptor blocker is being taken. Eye exam is current.  Hyperlipidemia This is a chronic problem. The current episode started more than 1 year ago. The problem is controlled. Exacerbating diseases include chronic renal disease, diabetes and obesity. Pertinent negatives include no chest pain, myalgias or shortness of breath. Current antihyperlipidemic treatment includes statins. Risk factors for coronary artery disease include family history, dyslipidemia, diabetes mellitus, hypertension, male sex, obesity and a sedentary lifestyle.  Hypertension This is a chronic problem. The current episode started more than 1 year ago. The problem is uncontrolled. Pertinent negatives include no chest pain, headaches, neck pain, palpitations or shortness of breath. Risk factors for coronary artery disease include diabetes mellitus, dyslipidemia and male gender. Past treatments include angiotensin blockers. Hypertensive end-organ damage includes kidney disease and  CAD/MI. Identifiable causes of hypertension include chronic renal disease.     Review of systems  Constitutional: + Minimally fluctuating body weight,  current  Body mass index is 31.25 kg/m. , no fatigue, no subjective hyperthermia, no subjective hypothermia Eyes: no blurry vision, no xerophthalmia ENT: no sore throat, no nodules palpated in throat, no dysphagia/odynophagia, no hoarseness Cardiovascular: no Chest Pain, no Shortness of Breath, no palpitations, no leg swelling Respiratory: no cough, no shortness of breath Gastrointestinal: no Nausea/Vomiting/Diarhhea Musculoskeletal: no muscle/joint aches Skin: no rashes, no hyperemia Neurological: no tremors, no numbness, no tingling, no dizziness Psychiatric: no depression, no anxiety   Objective:    BP (!) 154/96   Pulse 85   Ht 5\' 10"  (1.778 m)   Wt 217 lb 12.8 oz (98.8 kg)   BMI 31.25 kg/m   Wt Readings from Last 3 Encounters:  12/24/19 217 lb 12.8 oz (98.8 kg)  12/10/19 219 lb 3.2 oz (99.4 kg)  10/28/19 226 lb 3.2 oz (102.6 kg)      Physical Exam- Limited  Constitutional:  Body mass index is 31.25 kg/m. , not in acute distress, normal state of mind Eyes:  EOMI, no exophthalmos Neck: Supple Thyroid: No gross goiter Respiratory: Adequate breathing efforts Musculoskeletal: no gross deformities, strength intact in all four extremities, no gross restriction of joint movements Skin:  no rashes, no hyperemia Neurological: no tremor with outstretched hands,    CMP ( most recent) CMP     Component Value Date/Time   NA 133 (L) 08/26/2019 1432   NA 139 04/23/2019 1358   K 4.3 08/26/2019 1432   CL 101 08/26/2019 1432   CO2 23 08/26/2019 1432   GLUCOSE 310 (H) 08/26/2019 1432   BUN 36 (H) 08/26/2019 1432   BUN 23 04/23/2019 1358   CREATININE 2.62 (H) 08/26/2019 1432   CREATININE 1.94 (H) 05/13/2019 1029   CALCIUM 9.3 08/26/2019 1432   PROT 6.7 03/21/2019 1526   ALBUMIN 3.9 03/21/2019 1526   AST 19 03/21/2019  1526   ALT 20 03/21/2019 1526   ALKPHOS 56 03/21/2019 1526   BILITOT 0.4 03/21/2019 1526   GFRNONAA 25 (L) 08/26/2019 1432   GFRAA 29 (L) 08/26/2019 1432     Diabetic Labs (most recent): Lab Results  Component Value Date   HGBA1C 7.2 (A) 12/24/2019   HGBA1C 7.1 (A) 12/10/2019   HGBA1C 7.8 (A) 08/19/2019    Lipid Panel     Component Value Date/Time   CHOL 152 12/31/2018 0958   TRIG 172 (H) 12/31/2018  0958   HDL 41 12/31/2018 0958   CHOLHDL 3.9 04/26/2018 1011   VLDL 20 04/26/2018 1011   LDLCALC 77 12/31/2018 0958   LABVLDL 34 12/31/2018 0958      Lab Results  Component Value Date   TSH 2.110 04/29/2018      Assessment & Plan:   1. DM type 2 causing vascular disease ([place])  - [name] has currently uncontrolled symptomatic type 2 DM since  64 years of age.  He did not bring his meter nor logs to review.  He reports that his glucose readings range from 100-150, point-of-care A1c is 7.2% progressively improving from 8.2%.  He denies any major hypoglycemia.  - I had a long discussion with him about the progressive nature of diabetes and the pathology behind its complications. -his diabetes is complicated by coronary artery disease, CKD and he remains at a high risk for more acute and chronic complications which include CAD, CVA, CKD, retinopathy, and neuropathy. These are all discussed in detail with him.  - I have counseled him on diet  and weight management  by adopting a carbohydrate restricted/protein rich diet. Patient is encouraged to switch to  unprocessed or minimally processed     complex starch and increased protein intake (animal or plant source), fruits, and vegetables. -  he is advised to stick to a routine mealtimes to eat 3 meals  a day and avoid unnecessary snacks ( to snack only to correct hypoglycemia).   - he  admits there is a room for improvement in his diet and drink choices. -  Suggestion is made for him to avoid simple carbohydrates  from  his diet including Cakes, Sweet Desserts / Pastries, Ice Cream, Soda (diet and regular), Sweet Tea, Candies, Chips, Cookies, Sweet Pastries,  Store Bought Juices, Alcohol in Excess of  1-2 drinks a day, Artificial Sweeteners, Coffee Creamer, and "Sugar-free" Products. This will help patient to have stable blood glucose profile and potentially avoid unintended weight gain.   - he will be scheduled with Jearld Fenton, RDN, CDE for diabetes education.  - I have approached him with the following individualized plan to manage  his diabetes and patient agrees:   -Based on his presentation with controlled fasting glycemic profile, and A1c improved to 7.2%, he will not need prandial insulin for now.    -He is advised to continue Lantus 20 units daily with breakfast between 8 and 9 AM, continue to monitor blood glucose twice a day-daily before breakfast and at bedtime.  - he is encouraged to call clinic for blood glucose levels less than 70 or above 200 mg /dl. - he is advised to continue glipizide 2.5 mg p.o. twice daily with breakfast and supper.    - he is not a candidate for Metformin, SGLT2 inhibitors due to concurrent renal insufficiency.  - he will be considered for incretin therapy as appropriate next visit.  - Specific targets for  A1c;  LDL, HDL, Triglycerides, and  Waist Circumference were discussed with the patient.  2) Blood Pressure /Hypertension: His blood pressure is not controlled to target.  Reportedly, he did not tolerate HCTZ/lisinopril, remains only on hydralazine 100 mg p.o. 3 times daily and Coreg 25 mg p.o. twice daily.  He has appointment with his nephrologist in 2 weeks, deferred blood pressure medication adjustment to nephrology.     3) Lipids/Hyperlipidemia:   Review of his recent lipid panel showed  controlled  LDL at 77 .  he is advised  to continue Crestor 10 mg p.o. nightly.  Side effects and precautions discussed with him.  4)  Weight/Diet: His BMI is 31-   clearly  complicating his diabetes care.   he is  a candidate for more weight loss. I discussed with him the fact that loss of 5 - 10% of his  current body weight will have the most impact on his diabetes management.  Exercise, and detailed carbohydrates information provided  -  detailed on discharge instructions.  5) Chronic Care/Health Maintenance:  -he  is on ACEI/ARB and Statin medications and  is encouraged to initiate and continue to follow up with Ophthalmology, Dentist,  Podiatrist at least yearly or according to recommendations, and advised to  stay away from smoking. I have recommended yearly flu vaccine and pneumonia vaccine at least every 5 years; moderate intensity exercise for up to 150 minutes weekly; and  sleep for at least 7 hours a day.  - he is  advised to maintain close follow up with [name], MD for primary care needs, as well as his other providers for optimal and coordinated care.  - Time spent on this patient care encounter:  35 min, of which > 50% was spent in  counseling and the rest reviewing his blood glucose logs , discussing his hypoglycemia and hyperglycemia episodes, reviewing his current and  previous labs / studies  ( including abstraction from other facilities) and medications  doses and developing a  long term treatment plan and documenting his care.   Please refer to Patient Instructions for Blood Glucose Monitoring and Insulin/Medications Dosing Guide"  in media tab for additional information. Please  also refer to " Patient Self Inventory" in the Media  tab for reviewed elements of pertinent patient history.  [name] participated in the discussions, expressed understanding, and voiced agreement with the above plans.  All questions were answered to his satisfaction. he is encouraged to contact clinic should he have any questions or concerns prior to his return visit.    Follow up plan: - Return in about 4 months (around 04/25/2020) for F/U with Pre-visit  Labs, Meter, Logs, A1c here.Glade Lloyd, MD [facility] [address] [place], [place] [address] Phone: [phone]  Fax: [phone]    12/24/2019, 9:53 AM  This note was partially dictated with voice recognition software. Similar sounding words can be transcribed inadequately or may not  be corrected upon review.

## 2019-12-25 ENCOUNTER — Encounter: Payer: Medicaid Other | Admitting: Family Medicine

## 2019-12-27 ENCOUNTER — Encounter: Payer: Medicaid Other | Admitting: Family Medicine

## 2019-12-31 ENCOUNTER — Encounter (HOSPITAL_COMMUNITY): Payer: Self-pay | Admitting: *Deleted

## 2019-12-31 ENCOUNTER — Emergency Department (HOSPITAL_COMMUNITY): Payer: Medicaid Other

## 2019-12-31 ENCOUNTER — Emergency Department (HOSPITAL_COMMUNITY)
Admission: EM | Admit: 2019-12-31 | Discharge: 2019-12-31 | Disposition: A | Discharge status: Home / Self Care | Payer: Medicaid Other | Attending: Emergency Medicine | Admitting: Emergency Medicine

## 2019-12-31 ENCOUNTER — Other Ambulatory Visit: Payer: Self-pay

## 2019-12-31 ENCOUNTER — Telehealth: Payer: Self-pay | Admitting: Student

## 2019-12-31 ENCOUNTER — Telehealth: Payer: Self-pay | Admitting: *Deleted

## 2019-12-31 DIAGNOSIS — R42 Dizziness and giddiness: Secondary | ICD-10-CM | POA: Insufficient documentation

## 2019-12-31 DIAGNOSIS — R5383 Other fatigue: Secondary | ICD-10-CM | POA: Diagnosis present

## 2019-12-31 DIAGNOSIS — Z951 Presence of aortocoronary bypass graft: Secondary | ICD-10-CM | POA: Insufficient documentation

## 2019-12-31 DIAGNOSIS — Z87891 Personal history of nicotine dependence: Secondary | ICD-10-CM | POA: Diagnosis not present

## 2019-12-31 DIAGNOSIS — N183 Chronic kidney disease, stage 3 unspecified: Secondary | ICD-10-CM | POA: Diagnosis not present

## 2019-12-31 DIAGNOSIS — Z794 Long term (current) use of insulin: Secondary | ICD-10-CM | POA: Insufficient documentation

## 2019-12-31 DIAGNOSIS — I5042 Chronic combined systolic (congestive) and diastolic (congestive) heart failure: Secondary | ICD-10-CM | POA: Diagnosis not present

## 2019-12-31 DIAGNOSIS — I509 Heart failure, unspecified: Secondary | ICD-10-CM | POA: Diagnosis not present

## 2019-12-31 DIAGNOSIS — Z7982 Long term (current) use of aspirin: Secondary | ICD-10-CM | POA: Insufficient documentation

## 2019-12-31 DIAGNOSIS — R0602 Shortness of breath: Secondary | ICD-10-CM | POA: Diagnosis not present

## 2019-12-31 DIAGNOSIS — I2510 Atherosclerotic heart disease of native coronary artery without angina pectoris: Secondary | ICD-10-CM | POA: Diagnosis not present

## 2019-12-31 DIAGNOSIS — I130 Hypertensive heart and chronic kidney disease with heart failure and stage 1 through stage 4 chronic kidney disease, or unspecified chronic kidney disease: Secondary | ICD-10-CM | POA: Diagnosis not present

## 2019-12-31 DIAGNOSIS — I110 Hypertensive heart disease with heart failure: Secondary | ICD-10-CM | POA: Diagnosis not present

## 2019-12-31 DIAGNOSIS — E119 Type 2 diabetes mellitus without complications: Secondary | ICD-10-CM | POA: Diagnosis not present

## 2019-12-31 DIAGNOSIS — Z79899 Other long term (current) drug therapy: Secondary | ICD-10-CM | POA: Diagnosis not present

## 2019-12-31 LAB — CBC WITH DIFFERENTIAL/PLATELET
Abs Immature Granulocytes: 0.04 10*3/uL (ref 0.00–0.07)
Basophils Absolute: 0.1 10*3/uL (ref 0.0–0.1)
Basophils Relative: 1 %
Eosinophils Absolute: 0.3 10*3/uL (ref 0.0–0.5)
Eosinophils Relative: 3 %
HCT: 39.7 % (ref 39.0–52.0)
Hemoglobin: 13.2 g/dL (ref 13.0–17.0)
Immature Granulocytes: 0 %
Lymphocytes Relative: 16 %
Lymphs Abs: 1.5 10*3/uL (ref 0.7–4.0)
MCH: 28.3 pg (ref 26.0–34.0)
MCHC: 33.2 g/dL (ref 30.0–36.0)
MCV: 85 fL (ref 80.0–100.0)
Monocytes Absolute: 0.7 10*3/uL (ref 0.1–1.0)
Monocytes Relative: 7 %
Neutro Abs: 7.1 10*3/uL (ref 1.7–7.7)
Neutrophils Relative %: 73 %
Platelets: 252 10*3/uL (ref 150–400)
RBC: 4.67 MIL/uL (ref 4.22–5.81)
RDW: 14.2 % (ref 11.5–15.5)
WBC: 9.7 10*3/uL (ref 4.0–10.5)
nRBC: 0 % (ref 0.0–0.2)

## 2019-12-31 LAB — COMPREHENSIVE METABOLIC PANEL
ALT: 14 U/L (ref 0–44)
AST: 12 U/L — ABNORMAL LOW (ref 15–41)
Albumin: 3.9 g/dL (ref 3.5–5.0)
Alkaline Phosphatase: 48 U/L (ref 38–126)
Anion gap: 8 (ref 5–15)
BUN: 29 mg/dL — ABNORMAL HIGH (ref 8–23)
CO2: 23 mmol/L (ref 22–32)
Calcium: 9.1 mg/dL (ref 8.9–10.3)
Chloride: 108 mmol/L (ref 98–111)
Creatinine, Ser: 2.14 mg/dL — ABNORMAL HIGH (ref 0.61–1.24)
GFR calc Af Amer: 37 mL/min — ABNORMAL LOW (ref >60)
GFR calc non Af Amer: 32 mL/min — ABNORMAL LOW (ref >60)
Glucose, Bld: 142 mg/dL — ABNORMAL HIGH (ref 70–99)
Potassium: 4.3 mmol/L (ref 3.5–5.1)
Sodium: 139 mmol/L (ref 135–145)
Total Bilirubin: 0.8 mg/dL (ref 0.3–1.2)
Total Protein: 6.9 g/dL (ref 6.5–8.1)

## 2019-12-31 LAB — TROPONIN I (HIGH SENSITIVITY)
Troponin I (High Sensitivity): 36 ng/L — ABNORMAL HIGH (ref <18)
Troponin I (High Sensitivity): 33 ng/L — ABNORMAL HIGH (ref <18)

## 2019-12-31 LAB — BRAIN NATRIURETIC PEPTIDE: B Natriuretic Peptide: 1409 pg/mL — ABNORMAL HIGH (ref 0.0–100.0)

## 2019-12-31 MED ORDER — FUROSEMIDE 10 MG/ML IJ SOLN
60.0000 mg | Freq: Once | INTRAMUSCULAR | Status: AC
  Administered 2019-12-31: 60 mg via INTRAMUSCULAR
  Filled 2019-12-31: qty 6

## 2019-12-31 NOTE — Telephone Encounter (Signed)
Reports extreme weakness, sob with exertion and lightheadedness with activity for the past month. Blood pressure has been a little high around 170/100 and does not remember HR. Did not check BP while lightheaded. Reports dull, intermittent chest pain rated 2/10 that last 5-10 minutes then resolves. Denies active chest pain or sob. Reports sharp active headache rated 3/10 located on left front of head near nostrils. Reports cough and congestion at bedtime and early morning. Denies n/v, fever, swelling. Reports blurred vision in left eye that has improved after recent bursted blood vessel. Reports he has not contacted his PCP. Says he does not drive due to his eye problem. Advised to contact his PCP about his symptoms, keep scheduled visit on Friday and if his symptoms got worse, to go to the ED for an evaluation. Verbalized understanding of plan.

## 2019-12-31 NOTE — Discharge Instructions (Addendum)
I want you to take 60mg  of Lasix till you see your cardiologist on Friday. Please come back to the emergency department if you have any new or worsening concerning symptoms for example worsening shortness of breath, chest pain, difficulty breathing. Eat a low-salt diet. If your cardiology appointment does follow through on Friday, I did refer you to another cardiologist who you can call.

## 2019-12-31 NOTE — Telephone Encounter (Signed)
Pt called stating he's having light headedness, dizzy, weakness in legs- some chest pain- has congestion at night, feels like he's going to pass out

## 2019-12-31 NOTE — Telephone Encounter (Signed)
Given his given his chest pain I agree going to the ED for further evaluation

## 2019-12-31 NOTE — ED Triage Notes (Signed)
Pt with generalized weakness for past 2 months.  Recent heart surgery back in October.

## 2019-12-31 NOTE — ED Provider Notes (Signed)
[facility] Provider Note   CSN: [id number] Arrival date & time: 12/31/19  1151     History Chief Complaint  Patient presents with  . Weakness    [name] is a 64 y.o. male with pertinent past medical history of CAD status post quadruple bypass in October 2020, hypertension, type 2 diabetes, heart failure that presents the emergency department today for fatigue and shortness of breath for the last 2 months.  Patient states that he was doing well after his bypass and then he started feeling short of breath in May.  Patient states that he feels short of breath on exertion specifically, states that he came in today because the shortness of breath has been getting worse.  Denies any recent weight changes, states that he has been taking his diuretic for his heart failure as directed.  Denies any recent triggers that would cause him to be in heart failure.  States that he took himself off his hypertension medications because they were making him fatigued.  Stopped taking them 1 month ago.  States that he was seeing a cardiologist up until a month ago, however call their office this morning and the cardiologist had retired.  Denies any fevers, chills, URI symptoms, sick contacts.  Denies any tobacco use or alcohol use.  States that he feels slightly dizzy when he does have shortness of breath, denies any chest pain.  Denies any headache, vision changes, syncope, weakness, paresthesias, back pain, abdominal pain, nausea, vomiting, leg swelling.   HPI     Past Medical History:  Diagnosis Date  . CAD (coronary artery disease) 03/03/2019  . Hypertension   . Type 2 diabetes mellitus with diabetic nephropathy ([place]) 04/23/2019    Patient Active Problem List   Diagnosis Date Noted  . Arthritis of both acromioclavicular joints 08/21/2019  . Peripheral arterial disease ([place]) 05/31/2019  . Edema 05/27/2019  . CAD (coronary artery disease) 05/27/2019  . DM type 2 causing  vascular disease ([place]) 04/23/2019  . S/P CABG (coronary artery bypass graft) 03/25/2019  . Moderate nonproliferative diabetic retinopathy of both eyes associated with type 2 diabetes mellitus ([place]) 03/18/2019  . Special screening for malignant neoplasms, colon 02/04/2019  . Family hx of colon cancer 02/04/2019  . Cardiomyopathy ([place]) 06/08/2018  . CRI (chronic renal insufficiency), stage 3 (moderate) 06/08/2018  . Acute combined systolic (congestive) and diastolic (congestive) heart failure ([place]) 04/26/2018  . Essential hypertension, benign 04/26/2018  . Diabetic nephropathy ([place]) 04/26/2018  . Hypertensive urgency 04/26/2018  . Mixed hyperlipidemia 04/26/2018  . Elevated troponin 04/26/2018    Past Surgical History:  Procedure Laterality Date  . BIOPSY  06/27/2019   Procedure: BIOPSY;  Surgeon: Rogene Houston, MD;  [facility];  Service: Endoscopy;;  ascending colon polyp  . CARDIAC SURGERY    . COLONOSCOPY N/A 06/27/2019   Procedure: COLONOSCOPY;  Surgeon: Rogene Houston, MD;  [facility];  Service: Endoscopy;  Laterality: N/A;  1030am-office rescheduled to 06/27/19 @9 :55am  . CORONARY ARTERY BYPASS GRAFT N/A 03/25/2019   Procedure: CORONARY ARTERY BYPASS GRAFTING (CABG) X  4 USING LEFT INTERNAL MAMMARY ARTERY AND RIGHT SAPHENOUS VEIN GRAFTS;  Surgeon: [name], MD;  Location: [facility];  Service: Open Heart Surgery;  Laterality: N/A;  . EXCISION MORTON'S NEUROMA    . POLYPECTOMY  06/27/2019   Procedure: POLYPECTOMY;  Surgeon: Rogene Houston, MD;  [facility];  Service: Endoscopy;;  proximal sigmoid colon  .  RIGHT/LEFT HEART CATH AND CORONARY ANGIOGRAPHY N/A 03/12/2019   Procedure: RIGHT/LEFT HEART CATH AND CORONARY ANGIOGRAPHY;  Surgeon: [name], MD;  Location: [facility];  Service: Cardiovascular;  Laterality: N/A;  . TEE WITHOUT CARDIOVERSION N/A 03/25/2019   Procedure: TRANSESOPHAGEAL ECHOCARDIOGRAM (TEE);  Surgeon:  [name], MD;  Location: [facility];  Service: Open Heart Surgery;  Laterality: N/A;       Family History  Problem Relation Age of Onset  . Cancer Mother   . Heart Problems Father   . Diabetes Father     Social History   Tobacco Use  . Smoking status: Former Smoker    Quit date: 07/06/1988    Years since quitting: 31.5  . Smokeless tobacco: Never Used  Vaping Use  . Vaping Use: Never used  Substance Use Topics  . Alcohol use: Not Currently    Comment: socially  . Drug use: No    Home Medications Prior to Admission medications   Medication Sig Start Date End Date Taking? Authorizing Provider  aspirin EC 81 MG tablet Take 81 mg by mouth daily.   Yes [provider]  Cholecalciferol (VITAMIN D-3) 125 MCG (5000 UT) TABS Take 5,000 Units by mouth daily.   Yes [provider]  CHROMIUM PICOLINATE MEGA PO Take 1 capsule by mouth daily.   Yes [provider]  CINNAMON PO Take 1 capsule by mouth daily.   Yes [provider]  Coenzyme Q10 (COQ10 PO) Take 1 tablet by mouth daily.   Yes [provider]  Cyanocobalamin (B-12) 2500 MCG TABS Take 2,500 mcg by mouth daily.   Yes [provider]  furosemide (LASIX) 40 MG tablet Take 40 mg by mouth daily.   Yes [provider]  glipiZIDE (GLUCOTROL) 5 MG tablet Take 0.5 tablets (2.5 mg total) by mouth daily before breakfast. Patient taking differently: Take 2.5 mg by mouth 2 (two) times daily before a meal.  12/10/19  Yes Renato Shin, MD  insulin glargine (LANTUS SOLOSTAR) 100 UNIT/ML Solostar Pen Inject 25 Units into the skin every morning. Patient taking differently: Inject 20 Units into the skin every morning.  12/10/19  Yes Renato Shin, MD  Magnesium 100 MG CAPS Take 1 capsule by mouth daily.   Yes [provider]  Multiple Vitamin (MULTIVITAMIN WITH MINERALS) TABS tablet Take 1 tablet by mouth daily.   Yes [provider]  Multiple Vitamins-Minerals  (ZINC PO) Take 1 tablet by mouth daily.   Yes [provider]  sertraline (ZOLOFT) 100 MG tablet Take 100 mg by mouth daily.   Yes [provider]  acetaminophen (TYLENOL) 500 MG tablet Take 1,000 mg by mouth every 6 (six) hours as needed for moderate pain or headache. Patient not taking: Reported on 12/31/2019    [provider]  Boswellia-Glucosamine-Vit D (OSTEO BI-FLEX ONE PER DAY PO) Take 1 tablet by mouth daily. Patient not taking: Reported on 12/31/2019    [provider]  hydrALAZINE (APRESOLINE) 100 MG tablet Take 1 tablet (100 mg total) by mouth 3 (three) times daily. Patient not taking: Reported on 12/31/2019 05/27/19   Imogene Burn, PA-C  HYDROcodone-acetaminophen (NORCO/VICODIN) 5-325 MG tablet Take 1 tablet by mouth every 6 (six) hours as needed for moderate pain. Patient not taking: Reported on 12/31/2019 11/08/19   [name], MD  Insulin Pen Needle (B-D ULTRAFINE III SHORT PEN) 31G X 8 MM MISC 1 each by Does not apply route as directed. Patient  not taking: Reported on 12/31/2019 05/30/19   Cassandria Anger, MD    Allergies    Bee venom  Review of Systems   Review of Systems  Constitutional: Positive for fatigue. Negative for chills, diaphoresis and fever.  HENT: Negative for congestion, sore throat and trouble swallowing.   Eyes: Negative for pain and visual disturbance.  Respiratory: Positive for shortness of breath. Negative for apnea, cough, chest tightness and wheezing.   Cardiovascular: Negative for chest pain, palpitations and leg swelling.  Gastrointestinal: Negative for abdominal distention, abdominal pain, diarrhea, nausea and vomiting.  Genitourinary: Negative for difficulty urinating.  Musculoskeletal: Negative for back pain, joint swelling, myalgias, neck pain and neck stiffness.  Skin: Negative for pallor.  Neurological: Positive for dizziness. Negative for facial asymmetry, speech difficulty, weakness,  light-headedness, numbness and headaches.  Psychiatric/Behavioral: Negative for confusion.    Physical Exam Updated Vital Signs BP (!) 161/93   Pulse (!) 108   Temp 98.2 F (36.8 C) (Oral)   Resp (!) 24   Ht 5\' 10"  (1.778 m)   Wt 96.2 kg   SpO2 97%   BMI 30.42 kg/m   Physical Exam Constitutional:      General: He is not in acute distress.    Appearance: Normal appearance. He is not ill-appearing, toxic-appearing or diaphoretic.     Comments: Patient is sitting comfortably on bed, is not in any respiratory distress.  Is able to talk to me in full sentences, airway intact.  100% on room air.  HENT:     Head: Normocephalic and atraumatic.     Mouth/Throat:     Mouth: Mucous membranes are moist.     Pharynx: Oropharynx is clear.  Eyes:     General: No scleral icterus.    Extraocular Movements: Extraocular movements intact.     Pupils: Pupils are equal, round, and reactive to light.  Cardiovascular:     Rate and Rhythm: Normal rate and regular rhythm.     Pulses: Normal pulses.     Heart sounds: Normal heart sounds.  Pulmonary:     Effort: Pulmonary effort is normal. No respiratory distress.     Breath sounds: Normal breath sounds. No stridor. No wheezing, rhonchi or rales.  Chest:     Chest wall: No tenderness.  Abdominal:     General: Abdomen is flat. There is no distension.     Palpations: Abdomen is soft.     Tenderness: There is no abdominal tenderness. There is no guarding or rebound.  Musculoskeletal:        General: No swelling or tenderness. Normal range of motion.     Cervical back: Normal range of motion and neck supple. No rigidity or tenderness.     Right lower leg: No edema.     Left lower leg: No edema.  Skin:    General: Skin is warm and dry.     Capillary Refill: Capillary refill takes less than 2 seconds.     Coloration: Skin is not pale.  Neurological:     General: No focal deficit present.     Mental Status: He is alert and oriented to person,  place, and time.     Cranial Nerves: No cranial nerve deficit.     Sensory: No sensory deficit.     Motor: No weakness.     Coordination: Coordination normal.  Psychiatric:        Mood and Affect: Mood normal.        Behavior: Behavior normal.  ED Results / Procedures / Treatments   Labs (all labs ordered are listed, but only abnormal results are displayed) Labs Reviewed  COMPREHENSIVE METABOLIC PANEL - Abnormal; Notable for the following components:      Result Value   Glucose, Bld 142 (*)    BUN 29 (*)    Creatinine, Ser 2.14 (*)    AST 12 (*)    GFR calc non Af Amer 32 (*)    GFR calc Af Amer 37 (*)    All other components within normal limits  BRAIN NATRIURETIC PEPTIDE - Abnormal; Notable for the following components:   B Natriuretic Peptide 1,409.0 (*)    All other components within normal limits  TROPONIN I (HIGH SENSITIVITY) - Abnormal; Notable for the following components:   Troponin I (High Sensitivity) 36 (*)    All other components within normal limits  TROPONIN I (HIGH SENSITIVITY) - Abnormal; Notable for the following components:   Troponin I (High Sensitivity) 33 (*)    All other components within normal limits  CBC WITH DIFFERENTIAL/PLATELET    EKG EKG Interpretation  Date/Time:  Tuesday December 31 2019 12:07:15 EDT Ventricular Rate:  87 PR Interval:    QRS Duration: 115 QT Interval:  427 QTC Calculation: 514 R Axis:   94 Text Interpretation: Sinus rhythm Nonspecific intraventricular conduction delay Abnormal T, consider ischemia, lateral leads Confirmed by Milton [name] [id number]) on 12/31/2019 12:23:08 PM   Radiology DG Chest 2 View  Result Date: 12/31/2019 CLINICAL DATA:  Dyspnea, generalized weakness EXAM: CHEST - 2 VIEW COMPARISON:  11/12/2019 FINDINGS: Lung volumes are small. Patchy lingular atelectasis or infiltrate is present, slightly progressive since prior examination. The lungs are otherwise clear. No pneumothorax or pleural effusion.  Coronary artery bypass grafting has been performed. Cardiac size within normal limits. No acute bone abnormality. IMPRESSION: Patchy lingular atelectasis or infiltrate. Electronically Signed   By: Fidela Salisbury MD   On: 12/31/2019 14:56    Procedures Procedures (including critical care time)  Medications Ordered in ED Medications  furosemide (LASIX) injection 60 mg (60 mg Intramuscular Given 12/31/19 1628)    ED Course  I have reviewed the triage vital signs and the nursing notes.  Pertinent labs & imaging results that were available during my care of the patient were reviewed by me and considered in my medical decision making (see chart for details).    MDM Rules/Calculators/A&P                          [name] is a 64 y.o. male with pertinent past medical history of CAD status post quadruple bypass in October 2020, hypertension, type 2 diabetes, heart failure that presents the emergency department today for fatigue and shortness of breath for the last 2 months.  On exam patient does not appear to be in acute respiratory distress, sitting comfortably in bed and able to speak to me in full sentences, not using sensory muscle use.  Satting 100% on room air. .The causes for shortness of breath include but are not limited to Cardiac (AHF, pericardial effusion and tamponade, arrhythmias, ischemia, etc) Respiratory (COPD, asthma, pneumonia, pneumothorax, primary pulmonary hypertension, PE/VQ mismatch) Hematological (anemia) Neuromuscular (ALS, Guillain-Barr, etc)  ECG interpreted by me demonstrated no signs of ischemia.  CXR demonstrated atelectasis versus infiltrate, did speak to Dr. Roderic Palau about this who does not think this is concerning any treatment.  Labs demonstrated CBC and CMP without any abnormalities, creatinine at baseline.  BNP 1409 and troponin 36.  Patient might be in acute heart failure from not taking hypertension medications, will consult cardiology about this.   Repeat troponin 33.  345 spoke to Dr. Harl Bowie, cardiology who recommends IV Lasix 60 and if he feels better upon reassessment changing his outpatient Lasix to 60 mg daily and cardiology appointment follow-up outpatient.  Upon reassessment patient states that he feels much better, nursing was able to walk patient and he was satting at 98% on room air.  Patient states that he felt well on the walk, states that he has not felt that well in a couple months.  Patient to be discharged with Dr. Nelly Laurence recommendations and outpatient follow-up.  Patient states that he does have cardiology appointment on Friday.  Doubt need for further emergent work up at this time. I explained the diagnosis and have given explicit precautions to return to the ER including for any other new or worsening symptoms. The patient understands and accepts the medical plan as it's been dictated and I have answered their questions. Discharge instructions concerning home care and prescriptions have been given. The patient is STABLE and is discharged to home in good condition.  I discussed this case with my attending physician who cosigned this note including patient's presenting symptoms, physical exam, and planned diagnostics and interventions. Attending physician stated agreement with plan or made changes to plan which were implemented.    Final Clinical Impression(s) / ED Diagnoses Final diagnoses:  Shortness of breath  Acute congestive heart failure, unspecified heart failure type [facility])    Rx / DC Orders ED Discharge Orders    None       Alfredia Client, PA-C 12/31/19 2300    Milton [name], MD 01/01/20 [phone]

## 2019-12-31 NOTE — ED Notes (Signed)
C/O SOB and weakness with excertion

## 2019-12-31 NOTE — Telephone Encounter (Signed)
Pt called his cardiologist in [place] and they set him up an appt for this Friday and pt states the office told him to call his pcp. Pt states he is having fatigue, light headedness, weakness when walking 20 - 50 feet. Sob often, chest pain in center of chest for the past 1 -2 weeks taht last for about 5 min - 10 mins. Advised pt to go to ED. He states he will go to [facility].

## 2019-12-31 NOTE — Telephone Encounter (Signed)
Please call [phone]  Scheduled pt for Friday to see Jonni Sanger

## 2020-01-01 ENCOUNTER — Telehealth: Payer: Self-pay | Admitting: *Deleted

## 2020-01-01 DIAGNOSIS — I509 Heart failure, unspecified: Secondary | ICD-10-CM

## 2020-01-01 NOTE — Telephone Encounter (Signed)
   Transition Care Management Follow-up Telephone Call  . Medicaid Managed Care Transition Call Status:MM [facility] Call Made  . Date of discharge and from where: [facility], 12/31/19  . How have you been since you were released from the hospital? "better"  . Any questions or concerns? Cardiologist resigned and pt was not notified; he would like to be seen by a MD instead of another Advanced Care Provider Items Reviewed: Marland Kitchen Did the pt receive and understand the discharge instructions provided? Yes  . Medications obtained and verified? Yes  . Any new allergies since your discharge? No  . Dietary orders reviewed? Yes . Do you have support at home? yes Functional Questionnaire: (I = Independent and D = Dependent)  ADLs: Independent Bathing/Dressing:Independent Meal Prep: Independent Eating: Independent Maintaining continence: Independent Transferring/Ambulation: Independent Managing Meds: Independent Follow up appointments reviewed:  PCP Hospital f/u appt confirmed? No pt to all PCP on 01/01/20 Specialist Hospital f/u appt confirmed? Scheduled to see Levell July on 01/03/20 @ 1030  Are transportation arrangements needed? No   If their condition worsens, is the pt aware to call PCP or go to the EmergencyDept.? Yes  Was the patient provided with contact information for the PCP's office or ED? yes  Was to pt encouraged to call back with questions or concerns? yes  Order for [place], RN, BSN, [place] Patient [place] [phone]

## 2020-01-02 ENCOUNTER — Telehealth: Payer: Self-pay | Admitting: *Deleted

## 2020-01-02 ENCOUNTER — Telehealth: Payer: Self-pay | Admitting: Family Medicine

## 2020-01-02 DIAGNOSIS — N1832 Chronic kidney disease, stage 3b: Secondary | ICD-10-CM | POA: Diagnosis not present

## 2020-01-02 DIAGNOSIS — R0602 Shortness of breath: Secondary | ICD-10-CM

## 2020-01-02 DIAGNOSIS — R079 Chest pain, unspecified: Secondary | ICD-10-CM

## 2020-01-02 NOTE — Telephone Encounter (Signed)
I spoke with Izora Ribas, the referral coordinator when I contacted Dr Lance Sell office for a patient follow up. She has to obtain  Dr. Lance Sell permission for patient scheduling, due to his full schedule.Izora Ribas she will also informed me that she will make a referral to a cardiologist. Patient has been aware he will be notified about his appointment as soon as possible.     Fernand Sorbello, / PEC

## 2020-01-02 NOTE — Telephone Encounter (Signed)
Discussed with pt. Pt states he is stable and would like appt next Thursday or Friday. He is seeing dr Kristian Covey np tomorrow morning but would like referral to cardiologist dr Quillian Quince bensimhon in [place] fax number per pt is [phone]. Referral put in.

## 2020-01-02 NOTE — Telephone Encounter (Signed)
May go ahead with cardiology referral  If patient having current shortness of breath recommend office visit tomorrow what ever opening If patient stable can do follow-up office visit Thursday or Friday of next week or the following week

## 2020-01-02 NOTE — Telephone Encounter (Signed)
Marcie Bal with the [facility] called, pt was seen 12/31/2019 at ER with SOB, needs ER follow up & referral to cardiology  (pt's current cardiologist is retiring & pt needs new doc)  (Dr. [name] next available OV is 01/29/2020 or later - need permission to use a same day slot and what day so pt can be contacted to come in for OV & to get cardio referral)  Please advise

## 2020-01-03 ENCOUNTER — Encounter: Payer: Self-pay | Admitting: Family Medicine

## 2020-01-03 ENCOUNTER — Ambulatory Visit (INDEPENDENT_AMBULATORY_CARE_PROVIDER_SITE_OTHER): Payer: Medicaid Other | Admitting: Family Medicine

## 2020-01-03 ENCOUNTER — Other Ambulatory Visit: Payer: Self-pay

## 2020-01-03 VITALS — BP 172/88 | HR 92 | Ht 70.0 in | Wt 217.0 lb

## 2020-01-03 DIAGNOSIS — I2510 Atherosclerotic heart disease of native coronary artery without angina pectoris: Secondary | ICD-10-CM | POA: Diagnosis not present

## 2020-01-03 DIAGNOSIS — G4733 Obstructive sleep apnea (adult) (pediatric): Secondary | ICD-10-CM | POA: Diagnosis not present

## 2020-01-03 DIAGNOSIS — I5032 Chronic diastolic (congestive) heart failure: Secondary | ICD-10-CM | POA: Diagnosis not present

## 2020-01-03 DIAGNOSIS — R0600 Dyspnea, unspecified: Secondary | ICD-10-CM

## 2020-01-03 DIAGNOSIS — I10 Essential (primary) hypertension: Secondary | ICD-10-CM

## 2020-01-03 DIAGNOSIS — E782 Mixed hyperlipidemia: Secondary | ICD-10-CM

## 2020-01-03 DIAGNOSIS — R0609 Other forms of dyspnea: Secondary | ICD-10-CM

## 2020-01-03 DIAGNOSIS — R531 Weakness: Secondary | ICD-10-CM | POA: Diagnosis not present

## 2020-01-03 NOTE — Patient Instructions (Signed)
Medication Instructions:  Continue all current medications.  Labwork: none  Testing/Procedures: none  Follow-Up: 6 months   Any Other Special Instructions Will Be Listed Below (If Applicable).  You have been referred to:  [place] have been referred to:  [facility]   If you need a refill on your cardiac medications before your next appointment, please call your pharmacy.

## 2020-01-03 NOTE — Progress Notes (Signed)
Cardiology Office Note  Date: 01/03/2020   ID: [name], DOB Mar 27, 1956, MRN [id number]  PCP:  [name], MD  Cardiologist:  No primary care provider on file. Electrophysiologist:  None   Chief Complaint: DOE  History of Present Illness: [name] is a 64 y.o. male with a history of dyspnea on exertion, CAD with history of 4 vessel CABG 03/25/2019 with (LIMA-LAD, SVG to PLV, OM2, D1), HTN, type 2 diabetes.  Last visit 10/15/2019 with [name].  He denied any exertional chest pain that but did continue to complain of exertional dyspnea and fatigue.  His blood pressures at home have been elevated at times in the 200/100 range.  He was taking diuretic for leg swelling prescribed by his nephrologist.  Amlodipine 5 mg daily was added for better blood pressure control.  He was following with nephrology for CKD stage III.  He stopped his rosuvastatin due to diffuse body aches.  There was mention of needing possible PCSK9 inhibitors.  His dyspnea on exertion was thought to possibly related to severely elevated blood pressures.  A CT of the chest was ordered to investigate for pulmonary pathology or diaphragmatic paralysis.  Recent presentation to emergency room 12/31/2019 complaining of weakness, fatigue, and short of breath for the last 2 months.  He stated he had been doing well after his bypass and then he started feeling shortness of breath of May.  Complained of shortness of breath on exertion specifically.  He stated the reason for visiting the emergency room was due to his shortness of breath becoming worse.  He had been taking his diuretic for his heart failure as directed.  He took himself off his hypertension medications because they were making him fatigued.  Stated he had stopped them 1 month ago stating he was feeling slightly dizzy when he did have shortness of breath but denied any chest pain.  Provider spoke with Dr. Harl Bowie during the visit who recommended IV  Lasix sixty and  patient felt better upon reassessment change his outpatient Lasix to 60 mg daily and cardiac appointment for follow-up outpatient.  Upon reassessment patient stated he felt better.  He was later discharged.  He presents today status post follow-up from ED visit.  Continues to complain of weakness, fatigue.  States shortness of breath may be slightly better.  States mostly he feels tired all the time with no explainable reason for the tiredness.  States he has been told he needs a sleep study.  States he gives out easily even when ironing clothes or something as simple as walking He does have diabetes not completely well controlled.  Recent hemoglobin A1c was 7.2% per his statement.  His significant other who is with him states his blood pressure had been low yesterday in the high 90s to low 100s.  States when he was getting up he felt dizzy.  Today he denies any issues along these lines.  His blood pressure was actually 165/100 when checked in his right arm after sitting for 10 minutes.  He states since his bypass surgery he has felt worse than he felt prior to the surgery.  He denies any anginal symptoms, orthostatic symptoms currently.  Denies any CVA or TIA-like symptoms.  States he sleeps on his side he cannot sleep on his back.  Denies any recent weight gain, lower extremity edema.  Patient states she is not taking any antihypertensive medications.  States he has been on amlodipine in the past, lisinopril,  HCTZ, beta-blockers.  He states all of these medications made him feel significantly tired and weak.  The last medication he was taking was hydralazine 100 mg 3 times daily.  He has since stopped that medication.  Currently on no antihypertensives.  Blood pressure is elevated today initially at 172/88.  Recheck on right arm was 165/100.   Past Medical History:  Diagnosis Date  . CAD (coronary artery disease) 03/03/2019  . Hypertension   . Type 2 diabetes mellitus with diabetic  nephropathy ([place]) 04/23/2019    Past Surgical History:  Procedure Laterality Date  . BIOPSY  06/27/2019   Procedure: BIOPSY;  Surgeon: Rogene Houston, MD;  [facility];  Service: Endoscopy;;  ascending colon polyp  . CARDIAC SURGERY    . COLONOSCOPY N/A 06/27/2019   Procedure: COLONOSCOPY;  Surgeon: Rogene Houston, MD;  [facility];  Service: Endoscopy;  Laterality: N/A;  1030am-office rescheduled to 06/27/19 @9 :55am  . CORONARY ARTERY BYPASS GRAFT N/A 03/25/2019   Procedure: CORONARY ARTERY BYPASS GRAFTING (CABG) X  4 USING LEFT INTERNAL MAMMARY ARTERY AND RIGHT SAPHENOUS VEIN GRAFTS;  Surgeon: [name], MD;  Location: [facility];  Service: Open Heart Surgery;  Laterality: N/A;  . EXCISION MORTON'S NEUROMA    . POLYPECTOMY  06/27/2019   Procedure: POLYPECTOMY;  Surgeon: Rogene Houston, MD;  [facility];  Service: Endoscopy;;  proximal sigmoid colon  . RIGHT/LEFT HEART CATH AND CORONARY ANGIOGRAPHY N/A 03/12/2019   Procedure: RIGHT/LEFT HEART CATH AND CORONARY ANGIOGRAPHY;  Surgeon: [name], MD;  Location: [facility];  Service: Cardiovascular;  Laterality: N/A;  . TEE WITHOUT CARDIOVERSION N/A 03/25/2019   Procedure: TRANSESOPHAGEAL ECHOCARDIOGRAM (TEE);  Surgeon: [name], MD;  Location: [facility];  Service: Open Heart Surgery;  Laterality: N/A;    Current Outpatient Medications  Medication Sig Dispense Refill  . acetaminophen (TYLENOL) 500 MG tablet Take 1,000 mg by mouth every 6 (six) hours as needed for moderate pain or headache.     Marland Kitchen aspirin EC 81 MG tablet Take 81 mg by mouth daily.    . [place]-Glucosamine-Vit D (OSTEO BI-FLEX ONE PER DAY PO) Take 1 tablet by mouth daily.     . Cholecalciferol (VITAMIN D-3) 125 MCG (5000 UT) TABS Take 5,000 Units by mouth daily.    . CHROMIUM PICOLINATE MEGA PO Take 1 capsule by mouth daily.    Marland Kitchen CINNAMON PO Take 1 capsule by mouth daily.    . Coenzyme Q10 (COQ10 PO) Take  1 tablet by mouth daily.    . Cyanocobalamin (B-12) 2500 MCG TABS Take 2,500 mcg by mouth daily.    . furosemide (LASIX) 40 MG tablet Take 40 mg by mouth daily.    Marland Kitchen glipiZIDE (GLUCOTROL) 5 MG tablet Take 0.5 tablets (2.5 mg total) by mouth daily before breakfast. 15 tablet 3  . HYDROcodone-acetaminophen (NORCO/VICODIN) 5-325 MG tablet Take 1 tablet by mouth every 6 (six) hours as needed for moderate pain. 15 tablet 0  . insulin glargine (LANTUS SOLOSTAR) 100 UNIT/ML Solostar Pen Inject 25 Units into the skin every morning. 5 pen PRN  . Insulin Pen Needle (B-D ULTRAFINE III SHORT PEN) 31G X 8 MM MISC 1 each by Does not apply route as directed. 100 each 3  . Magnesium 100 MG CAPS Take 1 capsule by mouth daily.    . Multiple Vitamin (MULTIVITAMIN WITH MINERALS) TABS tablet Take 1 tablet by mouth daily.    . Multiple  Vitamins-Minerals (ZINC PO) Take 1 tablet by mouth daily.    . sertraline (ZOLOFT) 100 MG tablet Take 100 mg by mouth daily.     No current facility-administered medications for this visit.   Allergies:  Bee venom   Social History: The patient  reports that he quit smoking about 31 years ago. He has never used smokeless tobacco. He reports previous alcohol use. He reports that he does not use drugs.   Family History: The patient's family history includes Cancer in his mother; Diabetes in his father; Heart Problems in his father.   ROS:  Please see the history of present illness. Otherwise, complete review of systems is positive for none.  All other systems are reviewed and negative.   Physical Exam: VS:  BP (!) 172/88   Pulse 92   Ht 5\' 10"  (1.778 m)   Wt (!) 217 lb (98.4 kg)   SpO2 99%   BMI 31.14 kg/m , BMI Body mass index is 31.14 kg/m.  Wt Readings from Last 3 Encounters:  01/03/20 (!) 217 lb (98.4 kg)  12/31/19 212 lb (96.2 kg)  12/24/19 217 lb 12.8 oz (98.8 kg)    General: Patient appears comfortable at rest. Neck: Supple, no elevated JVP or carotid bruits, no  thyromegaly. Lungs: Clear to auscultation, nonlabored breathing at rest. Cardiac: Regular rate and rhythm, no S3 or significant systolic murmur, no pericardial rub. Extremities: No pitting edema, distal pulses 2+. Skin: Warm and dry. Musculoskeletal: No kyphosis. Neuropsychiatric: Alert and oriented x3, affect grossly appropriate.  ECG:  EKG on 12/31/2019 showed sinus rhythm rate of 87, nonspecific intraventricular conduction delay, abnormal T, consider ischemia, lateral leads  Recent Labwork: 03/26/2019: Magnesium 2.7 12/31/2019: ALT 14; AST 12; B Natriuretic Peptide 1,409.0; BUN 29; Creatinine, Ser 2.14; Hemoglobin 13.2; Platelets 252; Potassium 4.3; Sodium 139     Component Value Date/Time   CHOL 152 12/31/2018 0958   TRIG 172 (H) 12/31/2018 0958   HDL 41 12/31/2018 0958   CHOLHDL 3.9 04/26/2018 1011   VLDL 20 04/26/2018 1011   LDLCALC 77 12/31/2018 0958    Other Studies Reviewed Today:  Nuclear stress test 09/25/2019:   No diagnostic ST segment changes to indicate ischemia.  Medium sized, moderate intensity, apical to basal inferior perfusion defect that is fixed at the base, partially reversible at the apex, and most consistent with scar with mild inferoapical peri-infarct ischemia.  This is an intermediate risk study.  Nuclear stress EF: 35%. Consider confirmatory echocardiogram if not done recently.    Echocardiogram 10/07/2019:  left ventricle has normal function. The left ventricle has no regional  wall motion abnormalities. There is moderate left ventricular hypertrophy.  Left ventricular diastolic  parameters are consistent with Grade I diastolic dysfunction (impaired  relaxation).  2. Right ventricular systolic function is normal. The right ventricular  size is normal.  3. Left atrial size was severely dilated.  4. The mitral valve is normal in structure. Trivial mitral valve  regurgitation. No evidence of mitral stenosis.  5. The aortic valve was not  well visualized. Aortic valve regurgitation  is not visualized. No aortic stenosis is present.  6. The inferior vena cava is normal in size with greater than 50%  respiratory variability, suggesting right atrial pressure of 3 mmHg.    Cardiac catheterization 03/12/2019   Mid LAD lesion is 80% stenosed.  Ost LAD to Mid LAD lesion is 50% stenosed.  1st Diag lesion is 80% stenosed.  2nd Diag lesion is 75% stenosed.  Mid LM to Dist LM lesion is 25% stenosed.  Prox Cx lesion is 90% stenosed.  1st Mrg lesion is 80% stenosed.  2nd Mrg lesion is 80% stenosed.  Dist RCA lesion is 90% stenosed.  Mid RCA lesion is 75% stenosed.  RPAV lesion is 80% stenosed.  LV end diastolic pressure is moderately elevated.  There is no aortic valve stenosis.  Hemodynamic findings consistent with mild pulmonary hypertension.   Severe multivessel CAD.  Plan for outpatient evaluation for CABG>  His symptoms are all with exertion.  He feels fine at rest.  I instructed him to avoid any strenuous activity, or anything that would bring on shortness of breath.  Continue aspirin and Imdur.   Continue Crestor.   Assessment and Plan:  1. CAD in native artery   2. Essential hypertension   3. Mixed hyperlipidemia   4. Chronic diastolic heart failure ([place])   5. DOE (dyspnea on exertion)   6. Weakness   7. OSA (obstructive sleep apnea)     1. CAD in native artery  CABG 03/25/2019 with (LIMA-LAD, SVG to PLV, OM2, D1).  Currently denies any progressive anginal symptoms.  Does have some mild dyspnea on exertion.  States he feels worse since the bypass surgery than he did prior.  Continue aspirin 81 mg,  2. Essential hypertension Uncontrolled hypertension.  Patient had been on several medications in the past including amlodipine, beta-blockers, lisinopril, HCTZ, as well as hydralazine.  He states they all made him feel significantly fatigued and weak.  He has since stopped all of his antihypertensive.  He  was hypertensive on arrival today at 172/88.  Repeat was 165/100 right arm  Please refer patient to hypertension clinic in [place] for management.  3. Mixed hyperlipidemia Last lipid panel showed total cholesterol 152, triglycerides 172, HDL 41, LDL 77.  Not on statin medications.  Has multiple risk factors including diabetes.  4. Chronic diastolic heart failure ([facility]) Echocardiogram 09/17/2019 showed normal LV function, moderate LVH, grade 1 diastolic dysfunction, trivial MR.  Recent emergency room visit revealed BNP of 1409.  Patient was given 60 mg of IV Lasix recommended by Dr. Harl Bowie.  He was to be on outpatient Lasix 60 mg daily.  5. DOE (dyspnea on exertion) Continues to complain of mild dyspnea on exertion but significant exertional fatigue is more prominent.  States he becomes significantly fatigued when performing everyday tasks such as ironing close, or walking short distances.  6. Weakness Recent complaints of weakness with presentation to the emergency room at [facility].   7.  Excessive daytime hypersomnolence Patient states he has significant fatigue, weakness, excessive daytime hypersomnolence.  States he was recommended to have a sleep study in the past.  He is willing to undergo sleep study as long as its not in [place].  Refer him to [facility] for evaluation of sleep disordered breathing  Medication Adjustments/Labs and Tests Ordered: Current medicines are reviewed at length with the patient today.  Concerns regarding medicines are outlined above.   Disposition: Follow-up with Dr. Harl Bowie or APP 6 months Signed, Levell July, NP 01/03/2020 11:24 AM    [facility] [place], [place], [place] [address] Phone: [phone]; Fax: [phone]

## 2020-01-06 ENCOUNTER — Telehealth: Payer: Medicaid Other | Admitting: *Deleted

## 2020-01-06 NOTE — Telephone Encounter (Signed)
I have previously spoken with Mr Minerva and also Dr Malachy Moan office in regards to a hospital follow up visit. Patient has an appointment on 01/09/20

## 2020-01-06 NOTE — Telephone Encounter (Deleted)
I have previously spoken with the patient ,and Dr

## 2020-01-09 ENCOUNTER — Ambulatory Visit: Payer: Medicaid Other | Admitting: Family Medicine

## 2020-01-09 ENCOUNTER — Other Ambulatory Visit: Payer: Self-pay

## 2020-01-09 ENCOUNTER — Encounter: Payer: Self-pay | Admitting: Family Medicine

## 2020-01-09 VITALS — BP 172/110 | HR 91 | Temp 97.6°F | Ht 70.0 in | Wt 216.0 lb

## 2020-01-09 DIAGNOSIS — E611 Iron deficiency: Secondary | ICD-10-CM | POA: Diagnosis not present

## 2020-01-09 DIAGNOSIS — R0600 Dyspnea, unspecified: Secondary | ICD-10-CM | POA: Diagnosis not present

## 2020-01-09 DIAGNOSIS — E538 Deficiency of other specified B group vitamins: Secondary | ICD-10-CM | POA: Diagnosis not present

## 2020-01-09 DIAGNOSIS — I10 Essential (primary) hypertension: Secondary | ICD-10-CM

## 2020-01-09 DIAGNOSIS — Z125 Encounter for screening for malignant neoplasm of prostate: Secondary | ICD-10-CM | POA: Diagnosis not present

## 2020-01-09 DIAGNOSIS — Z7689 Persons encountering health services in other specified circumstances: Secondary | ICD-10-CM

## 2020-01-09 DIAGNOSIS — E785 Hyperlipidemia, unspecified: Secondary | ICD-10-CM | POA: Diagnosis not present

## 2020-01-09 MED ORDER — HYDRALAZINE HCL 25 MG PO TABS
ORAL_TABLET | ORAL | 0 refills | Status: DC
Start: 2020-01-09 — End: 2020-02-06

## 2020-01-09 NOTE — Progress Notes (Signed)
   Subjective:    Patient ID: [name], male    DOB: 1955/11/09, 64 y.o.   MRN: [id number]  HPIhospital follow up.  Patient in the hospital because of significant dyspnea underlying cardiac disease as well as possible diastolic dysfunction recent echo showed ejection fraction 55% patient still gets short of breath with activity.  Denies swelling in the legs.  Denies PND orthopnea. ER notes lab work EKG were reviewed  Review of Systems  Constitutional: Negative for diaphoresis and fatigue.  HENT: Negative for congestion and rhinorrhea.   Respiratory: Positive for shortness of breath. Negative for cough.   Cardiovascular: Negative for chest pain and leg swelling.  Gastrointestinal: Negative for abdominal pain and diarrhea.  Skin: Negative for color change and rash.  Neurological: Negative for dizziness and headaches.  Psychiatric/Behavioral: Negative for behavioral problems and confusion.       Objective:   Physical Exam Vitals reviewed.  Constitutional:      General: He is not in acute distress. HENT:     Head: Normocephalic and atraumatic.  Eyes:     General:        Right eye: No discharge.        Left eye: No discharge.  Neck:     Trachea: No tracheal deviation.  Cardiovascular:     Rate and Rhythm: Normal rate and regular rhythm.     Heart sounds: Normal heart sounds. No murmur heard.   Pulmonary:     Effort: Pulmonary effort is normal. No respiratory distress.     Breath sounds: Normal breath sounds.  Lymphadenopathy:     Cervical: No cervical adenopathy.  Skin:    General: Skin is warm and dry.  Neurological:     Mental Status: He is alert.     Coordination: Coordination normal.  Psychiatric:        Behavior: Behavior normal.    No pedal edema       Assessment & Plan:  No obvious sign of heart failure currently. Will be following up with cardiology in the near future We will also be doing a sleep study as well Patient would benefit from seeing  pulmonary for further evaluation of possible underlying COPD or lung issues Elevated blood pressure healthy diet regular activity recommended minimize salt also recommend hydralazine 25 mg 3 times daily Follow-up within 2 to 3 weeks to check blood pressure

## 2020-01-10 LAB — CBC WITH DIFFERENTIAL/PLATELET
Basophils Absolute: 0.1 10*3/uL (ref 0.0–0.2)
Basos: 1 %
EOS (ABSOLUTE): 0.2 10*3/uL (ref 0.0–0.4)
Eos: 2 %
Hematocrit: 39 % (ref 37.5–51.0)
Hemoglobin: 13.1 g/dL (ref 13.0–17.7)
Immature Grans (Abs): 0 10*3/uL (ref 0.0–0.1)
Immature Granulocytes: 0 %
Lymphocytes Absolute: 1.3 10*3/uL (ref 0.7–3.1)
Lymphs: 15 %
MCH: 28.4 pg (ref 26.6–33.0)
MCHC: 33.6 g/dL (ref 31.5–35.7)
MCV: 85 fL (ref 79–97)
Monocytes Absolute: 0.5 10*3/uL (ref 0.1–0.9)
Monocytes: 6 %
Neutrophils Absolute: 6.4 10*3/uL (ref 1.4–7.0)
Neutrophils: 76 %
Platelets: 266 10*3/uL (ref 150–450)
RBC: 4.61 x10E6/uL (ref 4.14–5.80)
RDW: 14.1 % (ref 11.6–15.4)
WBC: 8.5 10*3/uL (ref 3.4–10.8)

## 2020-01-10 LAB — PSA: Prostate Specific Ag, Serum: 1.7 ng/mL (ref 0.0–4.0)

## 2020-01-10 LAB — LIPID PANEL
Chol/HDL Ratio: 6 ratio — ABNORMAL HIGH (ref 0.0–5.0)
Cholesterol, Total: 265 mg/dL — ABNORMAL HIGH (ref 100–199)
HDL: 44 mg/dL (ref >39)
LDL Chol Calc (NIH): 177 mg/dL — ABNORMAL HIGH (ref 0–99)
Triglycerides: 234 mg/dL — ABNORMAL HIGH (ref 0–149)
VLDL Cholesterol Cal: 44 mg/dL — ABNORMAL HIGH (ref 5–40)

## 2020-01-10 LAB — VITAMIN B12: Vitamin B-12: 1842 pg/mL — ABNORMAL HIGH (ref 232–1245)

## 2020-01-10 LAB — FERRITIN: Ferritin: 442 ng/mL — ABNORMAL HIGH (ref 30–400)

## 2020-01-13 ENCOUNTER — Encounter: Payer: Self-pay | Admitting: Family Medicine

## 2020-01-14 MED ORDER — ATORVASTATIN CALCIUM 10 MG PO TABS
10.0000 mg | ORAL_TABLET | Freq: Every day | ORAL | 3 refills | Status: DC
Start: 2020-01-14 — End: 2020-02-20

## 2020-01-14 NOTE — Addendum Note (Signed)
Addended by: Dairl Ponder on: 01/14/2020 10:40 AM   Modules accepted: Orders

## 2020-01-16 ENCOUNTER — Encounter: Payer: Self-pay | Admitting: Family Medicine

## 2020-01-20 ENCOUNTER — Encounter: Payer: Self-pay | Admitting: Cardiovascular Disease

## 2020-01-20 ENCOUNTER — Other Ambulatory Visit: Payer: Self-pay

## 2020-01-20 ENCOUNTER — Ambulatory Visit (INDEPENDENT_AMBULATORY_CARE_PROVIDER_SITE_OTHER): Payer: Medicaid Other | Admitting: Cardiovascular Disease

## 2020-01-20 VITALS — BP 192/104 | HR 84 | Ht 70.0 in | Wt 216.0 lb

## 2020-01-20 DIAGNOSIS — I5041 Acute combined systolic (congestive) and diastolic (congestive) heart failure: Secondary | ICD-10-CM

## 2020-01-20 DIAGNOSIS — I739 Peripheral vascular disease, unspecified: Secondary | ICD-10-CM | POA: Diagnosis not present

## 2020-01-20 DIAGNOSIS — I2510 Atherosclerotic heart disease of native coronary artery without angina pectoris: Secondary | ICD-10-CM

## 2020-01-20 DIAGNOSIS — I10 Essential (primary) hypertension: Secondary | ICD-10-CM | POA: Diagnosis not present

## 2020-01-20 MED ORDER — DOXAZOSIN MESYLATE 4 MG PO TABS
4.0000 mg | ORAL_TABLET | Freq: Every day | ORAL | 1 refills | Status: DC
Start: 2020-01-20 — End: 2020-06-17

## 2020-01-20 NOTE — Progress Notes (Signed)
Hypertension Clinic Initial Assessment:    Date:  01/20/2020   ID:  [name], DOB [birth year], MRN [id number]  PCP:  [name], MD  Cardiologist:  No primary care provider on file.  Nephrologist:  Referring MD: Verta Ellen., NP   CC: Hypertension  History of Present Illness:    [name] is a 64 y.o. male with a hx of CAD status post CABG, chronic systolic and diastolic heart failure, hypertension, hyperlipidemia, diabetes, peripheral arterial disease, and CKD 3 here to establish care in the hypertension clinic.  He had a four-vessel CABG on 03/2019.  He has struggled with poorly controlled blood pressures and his blood pressure has been as high as the 200s over 100s.  He has not tolerated amlodipine, lisinopril, HCTZ, or beta-blockers due to them making him feel fatigued.  He saw [name] on 7/23 and his blood pressure was 172/88.  He was referred to advanced hypertension clinic for further evaluation and management.  He notes that his BP is labile.   This morning it was 150s initially and improved to the 120s after checking it several times.  [name] main complaint is his breathing.  He has a hard time catching his breath with exertiona.  After standing for a while he starts to get lightheaded.  He had to hold on to the rails in the shower because he felt like he would collapse.  It seems to have gotten worse since his CABG.  He has been told he needs a sleep study.  He snores some.  His significant other notes that prior to the CABG he had apnea but none lately.  He has daytime somnolence.  He wakes up frequently due nocturia.  He denies apnea but doesn't feel rested in the AM.   Dr. Moshe Cipro started him on lasix which has helped his edema.  This was increased to 60mg  daily after a hospitalization for acute on chronic diastolic heart failure.He has been working on his diet.  He limits carbohydrates and has a high protein diet.  He limits red meat.  He  drinks a lot of water and limits soda.  He quit smoking 25-30 years ago.  He smoked 2ppd x10-15 years.     Previous antihypertensives: Amlodipine Lisinopril HCTZ Beta-blockers Hydralazine   Past Medical History:  Diagnosis Date  . CAD (coronary artery disease) 03/03/2019  . Hypertension   . Type 2 diabetes mellitus with diabetic nephropathy ([place]) 04/23/2019    Past Surgical History:  Procedure Laterality Date  . BIOPSY  06/27/2019   Procedure: BIOPSY;  Surgeon: Rogene Houston, MD;  [facility];  Service: Endoscopy;;  ascending colon polyp  . CARDIAC SURGERY    . COLONOSCOPY N/A 06/27/2019   Procedure: COLONOSCOPY;  Surgeon: Rogene Houston, MD;  [facility];  Service: Endoscopy;  Laterality: N/A;  1030am-office rescheduled to 06/27/19 @9 :55am  . CORONARY ARTERY BYPASS GRAFT N/A 03/25/2019   Procedure: CORONARY ARTERY BYPASS GRAFTING (CABG) X  4 USING LEFT INTERNAL MAMMARY ARTERY AND RIGHT SAPHENOUS VEIN GRAFTS;  Surgeon: [name], MD;  Location: [facility];  Service: Open Heart Surgery;  Laterality: N/A;  . EXCISION MORTON'S NEUROMA    . POLYPECTOMY  06/27/2019   Procedure: POLYPECTOMY;  Surgeon: Rogene Houston, MD;  [facility];  Service: Endoscopy;;  proximal sigmoid colon  . RIGHT/LEFT HEART CATH AND CORONARY ANGIOGRAPHY N/A 03/12/2019   Procedure: RIGHT/LEFT HEART CATH AND CORONARY  ANGIOGRAPHY;  Surgeon: [name], MD;  Location: [facility];  Service: Cardiovascular;  Laterality: N/A;  . TEE WITHOUT CARDIOVERSION N/A 03/25/2019   Procedure: TRANSESOPHAGEAL ECHOCARDIOGRAM (TEE);  Surgeon: [name], MD;  Location: [facility];  Service: Open Heart Surgery;  Laterality: N/A;    Current Medications: Current Meds  Medication Sig  . acetaminophen (TYLENOL) 500 MG tablet Take 1,000 mg by mouth every 6 (six) hours as needed for moderate pain or headache.   Marland Kitchen aspirin EC 81 MG tablet Take 81 mg by mouth daily.  Marland Kitchen  atorvastatin (LIPITOR) 10 MG tablet Take 1 tablet (10 mg total) by mouth daily.  . [place]-Glucosamine-Vit D (OSTEO BI-FLEX ONE PER DAY PO) Take 1 tablet by mouth daily.   . Cholecalciferol (VITAMIN D-3) 125 MCG (5000 UT) TABS Take 5,000 Units by mouth daily.  . CHROMIUM PICOLINATE MEGA PO Take 1 capsule by mouth daily.  Marland Kitchen CINNAMON PO Take 1 capsule by mouth daily.  . Coenzyme Q10 (COQ10 PO) Take 1 tablet by mouth daily.  . Cyanocobalamin (B-12) 2500 MCG TABS Take 2,500 mcg by mouth daily.  . furosemide (LASIX) 40 MG tablet Take 40 mg by mouth daily.  Marland Kitchen glipiZIDE (GLUCOTROL) 5 MG tablet Take 0.5 tablets (2.5 mg total) by mouth daily before breakfast.  . hydrALAZINE (APRESOLINE) 25 MG tablet Take one tablet po TID  . HYDROcodone-acetaminophen (NORCO/VICODIN) 5-325 MG tablet Take 1 tablet by mouth every 6 (six) hours as needed for moderate pain.  Marland Kitchen insulin glargine (LANTUS SOLOSTAR) 100 UNIT/ML Solostar Pen Inject 25 Units into the skin every morning.  . Insulin Pen Needle (B-D ULTRAFINE III SHORT PEN) 31G X 8 MM MISC 1 each by Does not apply route as directed.  . Magnesium 100 MG CAPS Take 1 capsule by mouth daily.  . Multiple Vitamin (MULTIVITAMIN WITH MINERALS) TABS tablet Take 1 tablet by mouth daily.  . Multiple Vitamins-Minerals (ZINC PO) Take 1 tablet by mouth daily.  . sertraline (ZOLOFT) 100 MG tablet Take 100 mg by mouth daily.     Allergies:   Bee venom   Social History   Socioeconomic History  . Marital status: Single    Spouse name: Not on file  . Number of children: Not on file  . Years of education: Not on file  . Highest education level: Not on file  Occupational History  . Not on file  Tobacco Use  . Smoking status: Former Smoker    Quit date: 07/06/1988    Years since quitting: 31.5  . Smokeless tobacco: Never Used  Vaping Use  . Vaping Use: Never used  Substance and Sexual Activity  . Alcohol use: Not Currently    Comment: socially  . Drug use: No  . Sexual  activity: Not on file  Other Topics Concern  . Not on file  Social History Narrative  . Not on file   Social Determinants of Health   Financial Resource Strain:   . Difficulty of Paying Living Expenses:   Food Insecurity:   . Worried About Charity fundraiser in the Last Year:   . Arboriculturist in the Last Year:   Transportation Needs:   . Film/video editor (Medical):   Marland Kitchen Lack of Transportation (Non-Medical):   Physical Activity:   . Days of Exercise per Week:   . Minutes of Exercise per Session:   Stress:   . Feeling of Stress :   Social Connections:   .  Frequency of Communication with Friends and Family:   . Frequency of Social Gatherings with Friends and Family:   . Attends Religious Services:   . Active Member of Clubs or Organizations:   . Attends Archivist Meetings:   Marland Kitchen Marital Status:      Family History: The patient's family history includes Cancer in his mother; Diabetes in his father; Heart Problems in his father.  ROS:   Please see the history of present illness.    All other systems reviewed and are negative.  EKGs/Labs/Other Studies Reviewed:    EKG:  EKG is not ordered today.  The ekg ordered 01/01/20 demonstrates Sinus rhythm.  Rate 87 bpm.  Nonspecific T wave abnormalities.  Echo 09/2019: IMPRESSIONS    1. Left ventricular ejection fraction, by estimation, is 55 to 60%. The  left ventricle has normal function. The left ventricle has no regional  wall motion abnormalities. There is moderate left ventricular hypertrophy.  Left ventricular diastolic  parameters are consistent with Grade I diastolic dysfunction (impaired  relaxation).  2. Right ventricular systolic function is normal. The right ventricular  size is normal.  3. Left atrial size was severely dilated.  4. The mitral valve is normal in structure. Trivial mitral valve  regurgitation. No evidence of mitral stenosis.  5. The aortic valve was not well visualized. Aortic  valve regurgitation  is not visualized. No aortic stenosis is present.  6. The inferior vena cava is normal in size with greater than 50%  respiratory variability, suggesting right atrial pressure of 3 mmHg.    Recent Labs: 03/26/2019: Magnesium 2.7 12/31/2019: ALT 14; B Natriuretic Peptide 1,409.0; BUN 29; Creatinine, Ser 2.14; Potassium 4.3; Sodium 139 01/09/2020: Hemoglobin 13.1; Platelets 266   Recent Lipid Panel    Component Value Date/Time   CHOL 265 (H) 01/09/2020 1335   TRIG 234 (H) 01/09/2020 1335   HDL 44 01/09/2020 1335   CHOLHDL 6.0 (H) 01/09/2020 1335   CHOLHDL 3.9 04/26/2018 1011   VLDL 20 04/26/2018 1011   LDLCALC 177 (H) 01/09/2020 1335    Physical Exam:    VS:  BP (!) 192/104   Pulse 84   Ht 5\' 10"  (1.778 m)   Wt 216 lb (98 kg)   BMI 30.99 kg/m  , BMI Body mass index is 30.99 kg/m. GENERAL:  Well appearing HEENT: Pupils equal round and reactive, fundi not visualized, oral mucosa unremarkable NECK:  No jugular venous distention, waveform within normal limits, carotid upstroke brisk and symmetric, no bruits, no thyromegaly LUNGS:  Clear to auscultation bilaterally HEART:  RRR.  PMI not displaced or sustained,S1 and S2 within normal limits, no S3, no S4, no clicks, no rubs, no murmurs ABD:  Flat, positive bowel sounds normal in frequency in pitch, no bruits, no rebound, no guarding, no midline pulsatile mass, no hepatomegaly, no splenomegaly EXT:  2 plus pulses throughout, no edema, no cyanosis no clubbing SKIN:  No rashes no nodules NEURO:  Cranial nerves II through XII grossly intact, motor grossly intact throughout PSYCH:  Cognitively intact, oriented to person place and time   ASSESSMENT:    No diagnosis found.  PLAN:    # Essential hypertension:  BP elevated here and at prior appoitnments.  It has been high at home but is better controlled than here.  He also has frequent nocturia.  We will start doxazosin 4 mg nightly.  He is interested in doing  the prep exercise program but wants to do something closer to  [place].  He will check his blood pressure daily and he was given information on the DASH diet program.  Given his CAD he should be on a beta-blocker but has not tolerated them due to fatigue.   Secondary Causes of Hypertension  Medications/Herbal: OCP, steroids, stimulants, antidepressants, weight loss medication, immune suppressants, NSAIDs, sympathomimetics, alcohol-rare, caffeine, licorice, ginseng, St. John's wort, chemo  Sleep Apnea- patient denies snoring/apnea Renal artery stenosis Hyperaldosteronism Hyper/hypothyroidism- thyroid normal 12/2019 Pheochromocytoma: (testing not indicated)  Cushing's syndrome: (testing not indicated) Coarctation of the aorta  # CAD s/p CABG:  Doing well.  Continue aspirin.  History of myalgias on statins and fatigue on beta-blockers.    Disposition:    FU with MD/PharmD in 1 month    Medication Adjustments/Labs and Tests Ordered: Current medicines are reviewed at length with the patient today.  Concerns regarding medicines are outlined above.  No orders of the defined types were placed in this encounter.  No orders of the defined types were placed in this encounter.    Signed, Skeet Latch, MD  01/20/2020 3:21 PM    [facility]

## 2020-01-20 NOTE — Patient Instructions (Addendum)
Medication Instructions:  START DOXAZOSIN 4 MG AT BEDTIME    Labwork: NONE   Testing/Procedures: NONE   Follow-Up: 1 MONTH WITH PHARM D 02/20/2020 AT 1:30 PM   You will receive a phone call from the PREP exercise and nutrition program to schedule an initial assessment.  Special Instructions:   MONITOR YOUR BLOOD PRESSURE TWICE A DAY, LOG IN BOOK PROVIDED. BRING BOOK AND MACHINE TO FOLLOW UP IN 1 MONTH   DASH Eating Plan DASH stands for "Dietary Approaches to Stop Hypertension." The DASH eating plan is a healthy eating plan that has been shown to reduce high blood pressure (hypertension). It may also reduce your risk for type 2 diabetes, heart disease, and stroke. The DASH eating plan may also help with weight loss. What are tips for following this plan?  General guidelines  Avoid eating more than 2,300 mg (milligrams) of salt (sodium) a day. If you have hypertension, you may need to reduce your sodium intake to 1,500 mg a day.  Limit alcohol intake to no more than 1 drink a day for nonpregnant women and 2 drinks a day for men. One drink equals 12 oz of beer, 5 oz of wine, or 1 oz of hard liquor.  Work with your health care provider to maintain a healthy body weight or to lose weight. Ask what an ideal weight is for you.  Get at least 30 minutes of exercise that causes your heart to beat faster (aerobic exercise) most days of the week. Activities may include walking, swimming, or biking.  Work with your health care provider or diet and nutrition specialist (dietitian) to adjust your eating plan to your individual calorie needs. Reading food labels   Check food labels for the amount of sodium per serving. Choose foods with less than 5 percent of the Daily Value of sodium. Generally, foods with less than 300 mg of sodium per serving fit into this eating plan.  To find whole grains, look for the word "whole" as the first word in the ingredient list. Shopping  Buy products labeled  as "low-sodium" or "no salt added."  Buy fresh foods. Avoid canned foods and premade or frozen meals. Cooking  Avoid adding salt when cooking. Use salt-free seasonings or herbs instead of table salt or sea salt. Check with your health care provider or pharmacist before using salt substitutes.  Do not fry foods. Cook foods using healthy methods such as baking, boiling, grilling, and broiling instead.  Cook with heart-healthy oils, such as olive, canola, soybean, or sunflower oil. Meal planning  Eat a balanced diet that includes: ? 5 or more servings of fruits and vegetables each day. At each meal, try to fill half of your plate with fruits and vegetables. ? Up to 6-8 servings of whole grains each day. ? Less than 6 oz of lean meat, poultry, or fish each day. A 3-oz serving of meat is about the same size as a deck of cards. One egg equals 1 oz. ? 2 servings of low-fat dairy each day. ? A serving of nuts, seeds, or beans 5 times each week. ? Heart-healthy fats. Healthy fats called Omega-3 fatty acids are found in foods such as flaxseeds and coldwater fish, like sardines, salmon, and mackerel.  Limit how much you eat of the following: ? Canned or prepackaged foods. ? Food that is high in trans fat, such as fried foods. ? Food that is high in saturated fat, such as fatty meat. ? Sweets, desserts, sugary drinks,  and other foods with added sugar. ? Full-fat dairy products.  Do not salt foods before eating.  Try to eat at least 2 vegetarian meals each week.  Eat more home-cooked food and less restaurant, buffet, and fast food.  When eating at a restaurant, ask that your food be prepared with less salt or no salt, if possible. What foods are recommended? The items listed may not be a complete list. Talk with your dietitian about what dietary choices are best for you. Grains Whole-grain or whole-wheat bread. Whole-grain or whole-wheat pasta. Brown rice. [place]ena Morrow. Bulgur. Whole-grain  and low-sodium cereals. Pita bread. Low-fat, low-sodium crackers. Whole-wheat flour tortillas. Vegetables Fresh or frozen vegetables (raw, steamed, roasted, or grilled). Low-sodium or reduced-sodium tomato and vegetable juice. Low-sodium or reduced-sodium tomato sauce and tomato paste. Low-sodium or reduced-sodium canned vegetables. Fruits All fresh, dried, or frozen fruit. Canned fruit in natural juice (without added sugar). Meat and other protein foods Skinless chicken or [place]. Ground chicken or [place]. Pork with fat trimmed off. Fish and seafood. Egg whites. Dried beans, peas, or lentils. Unsalted nuts, nut butters, and seeds. Unsalted canned beans. Lean cuts of beef with fat trimmed off. Low-sodium, lean deli meat. Dairy Low-fat (1%) or fat-free (skim) milk. Fat-free, low-fat, or reduced-fat cheeses. Nonfat, low-sodium ricotta or cottage cheese. Low-fat or nonfat yogurt. Low-fat, low-sodium cheese. Fats and oils Soft margarine without trans fats. Vegetable oil. Low-fat, reduced-fat, or light mayonnaise and salad dressings (reduced-sodium). Canola, safflower, olive, soybean, and sunflower oils. Avocado. Seasoning and other foods Herbs. Spices. Seasoning mixes without salt. Unsalted popcorn and pretzels. Fat-free sweets. What foods are not recommended? The items listed may not be a complete list. Talk with your dietitian about what dietary choices are best for you. Grains Baked goods made with fat, such as croissants, muffins, or some breads. Dry pasta or rice meal packs. Vegetables Creamed or fried vegetables. Vegetables in a cheese sauce. Regular canned vegetables (not low-sodium or reduced-sodium). Regular canned tomato sauce and paste (not low-sodium or reduced-sodium). Regular tomato and vegetable juice (not low-sodium or reduced-sodium). Angie Fava. Olives. Fruits Canned fruit in a light or heavy syrup. Fried fruit. Fruit in cream or butter sauce. Meat and other protein foods Fatty cuts  of meat. Ribs. Fried meat. Berniece Salines. Sausage. Bologna and other processed lunch meats. Salami. Fatback. Hotdogs. Bratwurst. Salted nuts and seeds. Canned beans with added salt. Canned or smoked fish. Whole eggs or egg yolks. Chicken or [place] with skin. Dairy Whole or 2% milk, cream, and half-and-half. Whole or full-fat cream cheese. Whole-fat or sweetened yogurt. Full-fat cheese. Nondairy creamers. Whipped toppings. Processed cheese and cheese spreads. Fats and oils Butter. Stick margarine. Lard. Shortening. Ghee. Bacon fat. Tropical oils, such as coconut, palm kernel, or palm oil. Seasoning and other foods Salted popcorn and pretzels. Onion salt, garlic salt, seasoned salt, table salt, and sea salt. Worcestershire sauce. Tartar sauce. Barbecue sauce. Teriyaki sauce. Soy sauce, including reduced-sodium. Steak sauce. Canned and packaged gravies. Fish sauce. Oyster sauce. Cocktail sauce. Horseradish that you find on the shelf. Ketchup. Mustard. Meat flavorings and tenderizers. Bouillon cubes. Hot sauce and Tabasco sauce. Premade or packaged marinades. Premade or packaged taco seasonings. Relishes. Regular salad dressings. Where to find more information:  National Heart, Lung, and [place]: https://wilson-eaton.com/  American Heart Association: www.heart.org Summary  The DASH eating plan is a healthy eating plan that has been shown to reduce high blood pressure (hypertension). It may also reduce your risk for type 2 diabetes, heart disease, and stroke.  With the DASH eating plan, you should limit salt (sodium) intake to 2,300 mg a day. If you have hypertension, you may need to reduce your sodium intake to 1,500 mg a day.  When on the DASH eating plan, aim to eat more fresh fruits and vegetables, whole grains, lean proteins, low-fat dairy, and heart-healthy fats.  Work with your health care provider or diet and nutrition specialist (dietitian) to adjust your eating plan to your individual calorie  needs. This information is not intended to replace advice given to you by your health care provider. Make sure you discuss any questions you have with your health care provider. Document Released: 05/19/2011 Document Revised: 05/12/2017 Document Reviewed: 05/23/2016 Elsevier Patient Education  2020 [employer].

## 2020-01-22 ENCOUNTER — Other Ambulatory Visit: Payer: Self-pay

## 2020-01-22 ENCOUNTER — Encounter: Payer: Self-pay | Admitting: Family Medicine

## 2020-01-22 ENCOUNTER — Ambulatory Visit: Payer: Medicaid Other | Admitting: Family Medicine

## 2020-01-22 VITALS — BP 130/88 | HR 92 | Temp 98.1°F | Ht 70.0 in | Wt 221.0 lb

## 2020-01-22 DIAGNOSIS — M25511 Pain in right shoulder: Secondary | ICD-10-CM | POA: Diagnosis not present

## 2020-01-22 DIAGNOSIS — I10 Essential (primary) hypertension: Secondary | ICD-10-CM | POA: Diagnosis not present

## 2020-01-22 DIAGNOSIS — R0602 Shortness of breath: Secondary | ICD-10-CM

## 2020-01-22 MED ORDER — HYDROCODONE-ACETAMINOPHEN 5-325 MG PO TABS: 1.0000 | ORAL_TABLET | Freq: Four times a day (QID) | ORAL | 0 refills | Status: DC | PRN

## 2020-01-22 NOTE — Progress Notes (Signed)
   Subjective:    Patient ID: [name], male    DOB: Sep 21, 1955, 64 y.o.   MRN: [id number]  HPI 2 week follow up on blood pressure.  He is being followed by cardiology also has appointments coming up with specialist including the pharmacist he is here today for follow-up regarding blood pressure  Needs refill on hydrocodone.  Patient has chronic intermittent lower back pain relates it does not radiate down the legs.  Denies any injury this is just chronic issue occasionally uses hydrocodone for denies any abusing other     Review of Systems  Constitutional: Negative for diaphoresis and fatigue.  HENT: Negative for congestion and rhinorrhea.   Respiratory: Negative for cough and shortness of breath.   Cardiovascular: Negative for chest pain and leg swelling.  Gastrointestinal: Negative for abdominal pain and diarrhea.  Skin: Negative for color change and rash.  Neurological: Negative for dizziness and headaches.  Psychiatric/Behavioral: Negative for behavioral problems and confusion.       Objective:   Physical Exam Vitals reviewed.  Constitutional:      General: He is not in acute distress. HENT:     Head: Normocephalic and atraumatic.  Eyes:     General:        Right eye: No discharge.        Left eye: No discharge.  Neck:     Trachea: No tracheal deviation.  Cardiovascular:     Rate and Rhythm: Normal rate and regular rhythm.     Heart sounds: Normal heart sounds. No murmur heard.   Pulmonary:     Effort: Pulmonary effort is normal. No respiratory distress.     Breath sounds: Normal breath sounds.  Lymphadenopathy:     Cervical: No cervical adenopathy.  Skin:    General: Skin is warm and dry.  Neurological:     Mental Status: He is alert.     Coordination: Coordination normal.  Psychiatric:        Behavior: Behavior normal.           Assessment & Plan:  Chronic low back pain intermittent does not use pain medicine frequently pain medication refill  given.  For infrequent use caution drowsiness no driving with pain medicine  Underlying cardiac disease stable currently seeing cardiology will be seeing Pharm.D. on a regular basis will also be seeing cardiology Blood pressure today is 130/88 which is fair but not great since he just recently went through an adjustment by cardiology we will not make any adjustments today, we will notify cardiology they would do follow-up  Blood pressure was checked several times sitting in the chair feet on the ground cuff at heart level  Wellness exam here in 4 to 6 weeks

## 2020-01-23 DIAGNOSIS — H25813 Combined forms of age-related cataract, bilateral: Secondary | ICD-10-CM | POA: Diagnosis not present

## 2020-01-23 DIAGNOSIS — E113532 Type 2 diabetes mellitus with proliferative diabetic retinopathy with traction retinal detachment not involving the macula, left eye: Secondary | ICD-10-CM | POA: Diagnosis not present

## 2020-01-23 DIAGNOSIS — E113511 Type 2 diabetes mellitus with proliferative diabetic retinopathy with macular edema, right eye: Secondary | ICD-10-CM | POA: Diagnosis not present

## 2020-01-23 DIAGNOSIS — H4313 Vitreous hemorrhage, bilateral: Secondary | ICD-10-CM | POA: Diagnosis not present

## 2020-01-28 DIAGNOSIS — I129 Hypertensive chronic kidney disease with stage 1 through stage 4 chronic kidney disease, or unspecified chronic kidney disease: Secondary | ICD-10-CM | POA: Diagnosis not present

## 2020-01-28 DIAGNOSIS — R5383 Other fatigue: Secondary | ICD-10-CM | POA: Diagnosis not present

## 2020-01-28 DIAGNOSIS — N1832 Chronic kidney disease, stage 3b: Secondary | ICD-10-CM | POA: Diagnosis not present

## 2020-01-28 DIAGNOSIS — E1122 Type 2 diabetes mellitus with diabetic chronic kidney disease: Secondary | ICD-10-CM | POA: Diagnosis not present

## 2020-01-31 ENCOUNTER — Telehealth: Payer: Self-pay

## 2020-01-31 NOTE — Telephone Encounter (Signed)
VMT pt reference referral to PREP

## 2020-02-03 ENCOUNTER — Telehealth: Payer: Self-pay

## 2020-02-03 ENCOUNTER — Encounter: Payer: Medicaid Other | Admitting: Family Medicine

## 2020-02-03 NOTE — Telephone Encounter (Signed)
Call from patient returning message reference PREP referral -explained program and locations of offerings, cost.  Interested in program, only barrier is distance Lives in [place] and doesn't drive. Significant other may also be interested in program and could drive. Next class start date would be mid sept. Will call him when start date is confirmed.

## 2020-02-04 ENCOUNTER — Other Ambulatory Visit: Payer: Self-pay | Admitting: Physician Assistant

## 2020-02-04 ENCOUNTER — Other Ambulatory Visit: Payer: Self-pay | Admitting: Family Medicine

## 2020-02-05 ENCOUNTER — Other Ambulatory Visit: Payer: Self-pay | Admitting: "Endocrinology

## 2020-02-05 ENCOUNTER — Telehealth: Payer: Self-pay | Admitting: Cardiovascular Disease

## 2020-02-05 MED ORDER — FUROSEMIDE 40 MG PO TABS: ORAL_TABLET | ORAL | 1 refills | Status: DC

## 2020-02-05 NOTE — Telephone Encounter (Signed)
New message    Jonni Sanger with [facility] called the prescription called is is for 40 mg Lasik, patient states that he is to take 60 mg of lasik-  They need a new prescription saying 60 mg if that is what you want the patient to continue taking

## 2020-02-05 NOTE — Telephone Encounter (Signed)
  [facility] Clover Mealy checked labs last week, will call to get copy

## 2020-02-05 NOTE — Telephone Encounter (Signed)
Spoke to patient he stated 3 weeks ago he went to [facility] ED.Stated Lasix was increased to 40 mg 1&1/2 tablets daily.Stated he needs refill sent to [place] [facility] [place].Advised I will send message to [place].

## 2020-02-05 NOTE — Telephone Encounter (Signed)
That is fine.  He should have a BMP checked to make sure renal function and potassium are stable.

## 2020-02-06 ENCOUNTER — Ambulatory Visit: Payer: Medicaid Other | Admitting: Neurology

## 2020-02-06 ENCOUNTER — Encounter: Payer: Self-pay | Admitting: Neurology

## 2020-02-06 ENCOUNTER — Other Ambulatory Visit: Payer: Self-pay

## 2020-02-06 VITALS — BP 134/88 | HR 84 | Ht 70.0 in | Wt 222.0 lb

## 2020-02-06 DIAGNOSIS — R0683 Snoring: Secondary | ICD-10-CM | POA: Diagnosis not present

## 2020-02-06 DIAGNOSIS — E669 Obesity, unspecified: Secondary | ICD-10-CM

## 2020-02-06 DIAGNOSIS — Z951 Presence of aortocoronary bypass graft: Secondary | ICD-10-CM

## 2020-02-06 DIAGNOSIS — R0681 Apnea, not elsewhere classified: Secondary | ICD-10-CM | POA: Diagnosis not present

## 2020-02-06 DIAGNOSIS — I5032 Chronic diastolic (congestive) heart failure: Secondary | ICD-10-CM

## 2020-02-06 DIAGNOSIS — R351 Nocturia: Secondary | ICD-10-CM | POA: Diagnosis not present

## 2020-02-06 DIAGNOSIS — G4719 Other hypersomnia: Secondary | ICD-10-CM

## 2020-02-06 NOTE — Progress Notes (Signed)
Subjective:    Patient ID: [name] is a 64 y.o. male.  HPI     Star Age, MD, PhD [facility] [address] [place], [place] [address]  Dear [name],   I saw your patient, [name], upon your kind request, in my Sleep clinic today for initial consultation of his sleep disorder, in particular, concern for underlying obstructive sleep apnea.  The patient is accompanied by his longtime girlfriend/partner today.  As you know, [name] is a 64 year old right-handed gentleman with an underlying medical history of diabetes, hypertension, coronary artery disease with status post four-vessel bypass in 03/2019, CHF, and obesity, who reports snoring and excessive daytime somnolence.  I reviewed your office note from 01/03/2020.  His Epworth sleepiness score is 5 out of 24, fatigue severity score is 61 out of 63.  His girlfriend has noted pauses in his breathing while he is asleep, but this was more so in the past.  His snoring used to be louder but since his bypass surgery he seems to snore less.  His bedtime is around 9 and rise time around 730.  He has nocturia about once per average night and denies recurrent morning headaches.  He has had pain in both shoulders and has seen Dr. Maxie Better for this.  He has received shots in both shoulders.  His blood pressure has been trending higher and he was on multiple medications, lately, with recent medication changes, his blood pressure has improved.  He has no family history of sleep apnea as far as he knows.  He is single and lives alone.  He has no pets.  He has no children.  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  He is experiencing fatigue and shortness of breath with minimal exertion.  He has not had cardiac rehab yet, it was postponed.   His Past Medical History Is Significant For: Past Medical History:  Diagnosis Date  . CAD (coronary artery disease) 03/03/2019  . Hypertension    . Type 2 diabetes mellitus with diabetic nephropathy ([place]) 04/23/2019    His Past Surgical History Is Significant For: Past Surgical History:  Procedure Laterality Date  . BIOPSY  06/27/2019   Procedure: BIOPSY;  Surgeon: Rogene Houston, MD;  [facility];  Service: Endoscopy;;  ascending colon polyp  . CARDIAC SURGERY    . COLONOSCOPY N/A 06/27/2019   Procedure: COLONOSCOPY;  Surgeon: Rogene Houston, MD;  [facility];  Service: Endoscopy;  Laterality: N/A;  1030am-office rescheduled to 06/27/19 @9 :55am  . CORONARY ARTERY BYPASS GRAFT N/A 03/25/2019   Procedure: CORONARY ARTERY BYPASS GRAFTING (CABG) X  4 USING LEFT INTERNAL MAMMARY ARTERY AND RIGHT SAPHENOUS VEIN GRAFTS;  Surgeon: [name], MD;  Location: [place];  Service: Open Heart Surgery;  Laterality: N/A;  . EXCISION MORTON'S NEUROMA    . POLYPECTOMY  06/27/2019   Procedure: POLYPECTOMY;  Surgeon: Rogene Houston, MD;  [facility];  Service: Endoscopy;;  proximal sigmoid colon  . RIGHT/LEFT HEART CATH AND CORONARY ANGIOGRAPHY N/A 03/12/2019   Procedure: RIGHT/LEFT HEART CATH AND CORONARY ANGIOGRAPHY;  Surgeon: [name], MD;  Location: [place] [facility];  Service: Cardiovascular;  Laterality: N/A;  . TEE WITHOUT CARDIOVERSION N/A 03/25/2019   Procedure: TRANSESOPHAGEAL ECHOCARDIOGRAM (TEE);  Surgeon: [name], MD;  Location: [place];  Service: Open Heart Surgery;  Laterality: N/A;    His Family History  Is Significant For: Family History  Problem Relation Age of Onset  . Colon cancer Mother   . Heart Problems Father   . Diabetes Father   . Valvular heart disease Father     His Social History Is Significant For: Social History   Socioeconomic History  . Marital status: Single    Spouse name: Not on file  . Number of children: Not on file  . Years of education: Not on file  . Highest education level: Not on file  Occupational History  . Not on file   Tobacco Use  . Smoking status: Former Smoker    Quit date: 07/06/1988    Years since quitting: 31.6  . Smokeless tobacco: Never Used  Vaping Use  . Vaping Use: Never used  Substance and Sexual Activity  . Alcohol use: Not Currently    Comment: socially  . Drug use: No  . Sexual activity: Not on file  Other Topics Concern  . Not on file  Social History Narrative  . Not on file   Social Determinants of Health   Financial Resource Strain:   . Difficulty of Paying Living Expenses: Not on file  Food Insecurity:   . Worried About Charity fundraiser in the Last Year: Not on file  . Ran Out of Food in the Last Year: Not on file  Transportation Needs:   . Lack of Transportation (Medical): Not on file  . Lack of Transportation (Non-Medical): Not on file  Physical Activity:   . Days of Exercise per Week: Not on file  . Minutes of Exercise per Session: Not on file  Stress:   . Feeling of Stress : Not on file  Social Connections:   . Frequency of Communication with Friends and Family: Not on file  . Frequency of Social Gatherings with Friends and Family: Not on file  . Attends Religious Services: Not on file  . Active Member of Clubs or Organizations: Not on file  . Attends Archivist Meetings: Not on file  . Marital Status: Not on file    His Allergies Are:  Allergies  Allergen Reactions  . Bee Venom Anaphylaxis  :   His Current Medications Are:  Outpatient Encounter Medications as of 02/06/2020  Medication Sig  . acetaminophen (TYLENOL) 500 MG tablet Take 1,000 mg by mouth every 6 (six) hours as needed for moderate pain or headache.   Marland Kitchen aspirin EC 81 MG tablet Take 81 mg by mouth daily.  Marland Kitchen atorvastatin (LIPITOR) 10 MG tablet Take 1 tablet (10 mg total) by mouth daily.  . [place]-Glucosamine-Vit D (OSTEO BI-FLEX ONE PER DAY PO) Take 1 tablet by mouth daily.   . Cholecalciferol (VITAMIN D-3) 125 MCG (5000 UT) TABS Take 5,000 Units by mouth daily.  . CHROMIUM  PICOLINATE MEGA PO Take 1 capsule by mouth daily.  Marland Kitchen CINNAMON PO Take 1 capsule by mouth daily.  . Coenzyme Q10 (COQ10 PO) Take 1 tablet by mouth daily.  . Cyanocobalamin (B-12) 2500 MCG TABS Take 2,500 mcg by mouth daily.  Marland Kitchen doxazosin (CARDURA) 4 MG tablet Take 1 tablet (4 mg total) by mouth at bedtime.  . furosemide (LASIX) 40 MG tablet TAKE 1 AND 1/2 TABLETS BY MOUTH DAILY  . glipiZIDE (GLUCOTROL) 5 MG tablet Take 0.5 tablets (2.5 mg total) by mouth daily before breakfast.  . HYDROcodone-acetaminophen (NORCO/VICODIN) 5-325 MG tablet Take 1 tablet by mouth every 6 (six) hours as needed for moderate pain.  Marland Kitchen insulin glargine (LANTUS  SOLOSTAR) 100 UNIT/ML Solostar Pen Inject 25 Units into the skin every morning.  . Insulin Pen Needle (B-D ULTRAFINE III SHORT PEN) 31G X 8 MM MISC 1 each by Does not apply route as directed.  . Magnesium 100 MG CAPS Take 1 capsule by mouth daily.  . Multiple Vitamin (MULTIVITAMIN WITH MINERALS) TABS tablet Take 1 tablet by mouth daily.  . Multiple Vitamins-Minerals (ZINC PO) Take 1 tablet by mouth daily.  . sertraline (ZOLOFT) 100 MG tablet Take 100 mg by mouth daily.  . [DISCONTINUED] hydrALAZINE (APRESOLINE) 25 MG tablet Take one tablet po TID   No facility-administered encounter medications on file as of 02/06/2020.  :  Review of Systems:  Out of a complete 14 point review of systems, all are reviewed and negative with the exception of these symptoms as listed below: Review of Systems  Neurological:       Here for sleep consult, no prior sleep study.  Pt reports he does not snore at night.   Epworth Sleepiness Scale 0= would never doze 1= slight chance of dozing 2= moderate chance of dozing 3= high chance of dozing  Sitting and reading:2 Watching TV:0 Sitting inactive in a public place (ex. Theater or meeting):0 As a passenger in a car for an hour without a break:0 Lying down to rest in the afternoon:2 Sitting and talking to someone:0 Sitting  quietly after lunch (no alcohol):1 In a car, while stopped in traffic:0 Total:5     Objective:  Neurological Exam  Physical Exam Physical Examination:   Vitals:   02/06/20 1310  BP: 134/88  Pulse: 84  SpO2: 97%    General Examination: The patient is a very pleasant 64 y.o. male in no acute distress. He appears well-developed and well-nourished and well groomed.   HEENT: Normocephalic, atraumatic, pupils are equal, round and reactive to light, extraocular tracking is good without limitation to gaze excursion or nystagmus noted.  Bilateral cataracts noted.  Right eyelid slightly droopy, not new per patient.  Hearing is grossly intact. Face is symmetric with normal facial animation. Speech is clear with no dysarthria noted. There is no hypophonia. There is no lip, neck/head, jaw or voice tremor. Neck is supple with full range of passive and active motion. There are no carotid bruits on auscultation. Oropharynx exam reveals: mild mouth dryness, adequate dental hygiene and moderate airway crowding, due to small airway entry, slightly wider uvula, tonsillar size of about 1+.  Mallampati class II.  Neck circumference of 19 inches.  Tongue protrudes centrally in palate elevates symmetrically.  He has a minimal overbite.  Chest: Clear to auscultation without wheezing, rhonchi or crackles noted.  Heart: S1+S2+0, regular and normal without murmurs, rubs or gallops noted.   Abdomen: Soft, non-tender and non-distended with normal bowel sounds appreciated on auscultation.  Extremities: There is no pitting edema in the distal lower extremities bilaterally.   Skin: Warm and dry without trophic changes noted.   Musculoskeletal: exam reveals no obvious joint deformities, tenderness or joint swelling or erythema.   Neurologically:  Mental status: The patient is awake, alert and oriented in all 4 spheres. His immediate and remote memory, attention, language skills and fund of knowledge are  appropriate. There is no evidence of aphasia, agnosia, apraxia or anomia. Speech is clear with normal prosody and enunciation. Thought process is linear. Mood is normal and affect is normal.  Cranial nerves II - XII are as described above under HEENT exam.  Motor exam: Normal bulk, strength and tone  is noted. There is no tremor, Romberg is negative. Fine motor skills and coordination: grossly intact.  Cerebellar testing: No dysmetria or intention tremor. There is no truncal or gait ataxia.  Sensory exam: intact to light touch in the upper and lower extremities.  Gait, station and balance: He stands easily. No veering to one side is noted. No leaning to one side is noted. Posture is age-appropriate and stance is narrow based. Gait shows normal stride length and normal pace. No problems turning are noted.   Assessment and Plan:   In summary, TRAVEION RUDDOCK is a very pleasant 64 y.o.-year old male with an underlying medical history of diabetes, hypertension, coronary artery disease with status post four-vessel bypass, CHF, and obesity, whose history and physical exam are concerning for obstructive sleep apnea (OSA). I had a long chat with the patient and his GF about my findings and the diagnosis of OSA, its prognosis and treatment options. We talked about medical treatments, surgical interventions and non-pharmacological approaches. I explained in particular the risks and ramifications of untreated moderate to severe OSA, especially with respect to developing cardiovascular disease down the Road, including congestive heart failure, difficult to treat hypertension, cardiac arrhythmias, or stroke. Even type 2 diabetes has, in part, been linked to untreated OSA. Symptoms of untreated OSA include daytime sleepiness, memory problems, mood irritability and mood disorder such as depression and anxiety, lack of energy, as well as recurrent headaches, especially morning headaches. We talked about trying to maintain  a healthy lifestyle in general, as well as the importance of weight control. We also talked about the importance of good sleep hygiene. I recommended the following at this time: sleep study.  I explained the sleep test procedure to the patient and also outlined possible surgical and non-surgical treatment options of OSA, including the use of a custom-made dental device (which would require a referral to a specialist dentist or oral surgeon), surgical options, such as traditional UPPP or a novel less invasive surgical option in the form of Inspire hypoglossal nerve stimulation (which would involve a referral to an ENT surgeon). I also explained the CPAP treatment option to the patient, who indicated that he would be willing to try CPAP if the need arises. I explained the importance of being compliant with PAP treatment, not only for insurance purposes but primarily to improve His symptoms, and for the patient's long term health benefit, including to reduce His cardiovascular risks. I answered all their questions today and the patient and his GF were in agreement. I plan to see him back after the sleep study is completed and encouraged him to call with any interim questions, concerns, problems or updates.   Thank you very much for allowing me to participate in the care of this nice patient. If I can be of any further assistance to you please do not hesitate to call me at [phone].  Sincerely,   Star Age, MD, PhD

## 2020-02-06 NOTE — Patient Instructions (Signed)

## 2020-02-07 ENCOUNTER — Telehealth: Payer: Self-pay | Admitting: Cardiovascular Disease

## 2020-02-07 ENCOUNTER — Telehealth: Payer: Self-pay

## 2020-02-07 NOTE — Telephone Encounter (Signed)
Spoke with Hinton Dyer and requested last BMET

## 2020-02-07 NOTE — Telephone Encounter (Signed)
LVMT reference next PREP class start on 02/25/20. Await cb to discuss.

## 2020-02-07 NOTE — Progress Notes (Signed)
Thank you for seeing him Dr. Rexene Alberts.  Thank you for sending the note to me.  Have a good weekend

## 2020-02-07 NOTE — Telephone Encounter (Signed)
Patient returned call to schedule referral for Dr. Oval Linsey. Transferred to [place].

## 2020-02-10 ENCOUNTER — Ambulatory Visit: Payer: Medicaid Other | Admitting: Endocrinology

## 2020-02-12 ENCOUNTER — Other Ambulatory Visit: Payer: Self-pay | Admitting: Family Medicine

## 2020-02-14 ENCOUNTER — Telehealth: Payer: Self-pay | Admitting: Family Medicine

## 2020-02-14 NOTE — Telephone Encounter (Signed)
Nurses Recently I received faxes from a pharmacy called [facility]  First fax was for Omega  3 capsules The second fax was for diabetes testing supplies and syringes.  First I will find out from the patient if these are legitimate request  This patient is followed by Dr. Loanne Drilling for his diabetes therefore that thanks should go to him  As for the omega-3 before I would authorize that I would want his cardiologist to approve  Please talk with patient find out if these request are legitimate or just a fishing expedition (we get bogus prescriptions all the time from pharmacies that patients did not authorize)

## 2020-02-14 NOTE — Telephone Encounter (Signed)
Pt contacted. Pt states that he did request information for this pharmacy and they were willing to send him supplies; per patient this is legit. Please advise. Thank you. Form in office.

## 2020-02-17 NOTE — Telephone Encounter (Signed)
See form please finish thank you

## 2020-02-19 NOTE — Telephone Encounter (Signed)
Form completed and will be faxed.

## 2020-02-20 ENCOUNTER — Other Ambulatory Visit: Payer: Self-pay

## 2020-02-20 ENCOUNTER — Ambulatory Visit (INDEPENDENT_AMBULATORY_CARE_PROVIDER_SITE_OTHER): Payer: Medicaid Other | Admitting: Pharmacist Clinician (PhC)/ Clinical Pharmacy Specialist

## 2020-02-20 VITALS — BP 220/130 | HR 87 | Resp 17 | Ht 70.0 in | Wt 220.4 lb

## 2020-02-20 DIAGNOSIS — I10 Essential (primary) hypertension: Secondary | ICD-10-CM | POA: Diagnosis not present

## 2020-02-20 MED ORDER — VALSARTAN 80 MG PO TABS: 80.0000 mg | ORAL_TABLET | Freq: Every day | ORAL | 6 refills | Status: DC

## 2020-02-20 NOTE — Progress Notes (Signed)
[place] Report   Patient Details  Name: [name] MRN: [id number] Date of Birth: 05-08-1956 Age: 64 y.o. PCP: [name], MD  Vitals:   02/20/20 1634  BP: (!) 180/110  Pulse: 87  SpO2: 97%  Weight: 222 lb 6.4 oz (100.9 kg)  Height: 5\' 10"  (1.778 m)      [facility] Eval - 02/20/20 1600      Referral    Referring Provider HTN clinic    Reason for referral Hypertension;Inactivity    Program Start Date 02/25/20   T/TH 1p-215pm x 12 wks      Measurement   Neck measurement 18 Inches    Waist Circumference 47 inches    Hip Circumference 44 inches    Body fat 31.1 percent      Information for Trainer   Goals Gain strength, endurance, lose 20 lbs, get back into shape    Current Exercise minimal    Orthopedic Concerns 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 2    I have no trouble taking out the trash. 1    I do housework such as vacuuming and dusting on my own without difficulty. 4    I can easily lift a gallon of milk (8lbs). 4    I can easily walk a mile. 1    I have no trouble reaching into high cupboards or reaching down to pick up something from the floor. 2    I do not have trouble doing out-door work such as Armed forces logistics/support/administrative officer, raking leaves, or gardening. 2      Mobility and Daily Activities   I feel younger than my age. 1    I feel independent. 4    I feel energetic. 1    I live an active life.  1    I feel strong. 1    I feel healthy. 1    I feel active as other people my age. 1      How fit  and strong are you.   Fit and Strong Total Score 26          Past Medical History:  Diagnosis Date  . CAD (coronary artery disease) 03/03/2019  . Hypertension   . Type 2 diabetes mellitus with diabetic nephropathy ([place]) 04/23/2019   Past Surgical History:  Procedure Laterality Date  . BIOPSY  06/27/2019   Procedure: BIOPSY;  Surgeon: Rogene Houston, MD;  [facility];  Service: Endoscopy;;  ascending colon polyp  . CARDIAC SURGERY    . COLONOSCOPY N/A 06/27/2019   Procedure: COLONOSCOPY;  Surgeon: Rogene Houston, MD;  [facility];  Service: Endoscopy;  Laterality: N/A;  1030am-office rescheduled to 06/27/19 @9 :55am  . CORONARY ARTERY BYPASS GRAFT N/A 03/25/2019   Procedure: CORONARY ARTERY BYPASS GRAFTING (CABG) X  4 USING LEFT INTERNAL MAMMARY ARTERY AND RIGHT SAPHENOUS VEIN GRAFTS;  Surgeon: [name], MD;  Location: [place];  Service: Open Heart Surgery;  Laterality: N/A;  . EXCISION MORTON'S NEUROMA    . POLYPECTOMY  06/27/2019   Procedure: POLYPECTOMY;  Surgeon: Rogene Houston, MD;  [facility];  Service: Endoscopy;;  proximal sigmoid colon  . RIGHT/LEFT HEART CATH AND CORONARY ANGIOGRAPHY N/A 03/12/2019   Procedure: RIGHT/LEFT HEART CATH AND CORONARY ANGIOGRAPHY;  Surgeon: [name], MD;  Location: [place] [facility];  Service: Cardiovascular;  Laterality: N/A;  . TEE WITHOUT CARDIOVERSION N/A 03/25/2019   Procedure: TRANSESOPHAGEAL ECHOCARDIOGRAM (TEE);  Surgeon: [name], MD;  Location: [place];  Service: Open Heart Surgery;  Laterality: N/A;   Social History   Tobacco Use  Smoking Status Former Smoker  . Quit date: 07/06/1988  . Years since quitting: 31.6  Smokeless Tobacco Never Used   Tracking BP and recording Did not have Cardiac rehab after CABG   Barnett Hatter 02/20/2020, 4:39 PM

## 2020-02-20 NOTE — Progress Notes (Signed)
02/21/2020 [name] Jan 22, 1956 [id number]   HPI:  [name] is a 64 y.o. male patient of Dr. Oval [name] , with a PMH below who presents today for follow up in the advanced hypertension clinic.  He was seen by Dr. Oval [name] last month and found to have a blood pressure of 192/104.  He was not on any BP medications at that time, as he stated everything tried had made him feel fatigued (see list below).  He was also unable to tolerate statins due to myalgias.  His main concern has been exertional dyspnea, which has not improved.  It was suggested he get a sleep study, but has not yet done this.  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  Dr. Oval [name] started him on doxazosin 4 mg.  Today he returns for follow up.  He has checked his home blood pressure a handful to times in the past month - see below.  He still has complaints of dyspnea on exertion, and is frustrated by this.  We had a long discussion about his blood pressure, which he thought was doing "okay".  Explained that with his diastolic readings so high (4 of the 10 were > 100, lowest was 82), his heart is not resting well and we need to use specific medications that will improve this.  Explained that he will most likely need multiple medications to control his pressure, and that it won't be "cured", but he will need these for the long term.     Click here for Secondary HTN Screening Tool (Then Refresh Note) Secondary Cause Data Comments  Herbals/Drugs  Screened:  01/20/2020 Not taking         Renovascular HTN             Sleep Apnea  Screened:  01/20/2020 No History Never evaluated  Patient denies snoring, states sleeps easily w/o concer  Hyperthyroidism  Screened:  04/29/2018 No History Previously ruled out  TSH 2.11  Hypothyroidism   Screened:  04/29/2018 No History Previously ruled out   TSH 2.11  Hyperaldosteronism             Pheochromocytoma  Screened:  01/20/2020 No History     Testing not indicated  Cushing's Syndrome  Screened:  01/20/2020 No History     Testing not indicated  Hyperparathyroidism             Coarctation of Aorta            Chemotherapeutic Agents        Compliance                Past Medical History: ASCVD S/p CABG x 4, PAD -   CHF Chronic systolic and diastolic 6/29 echo EF at 47-65% up from 46-50%, grade 1 diastolic dysfunction  hyperlipidemia 7/21:  TC 265, TG 234, HDL 44, LDL 177 - no meds  DM2 10/20: A1c 8.2 - glipizide 2.5 mg qd, Lantus qd  CKD3 7/21:  SCr 2.14, GFR 32 CrCl 50     Blood Pressure Goal:  130/80  Current Medications: doxazosin 4 mg qhs - didn't take last night; furosemide 60  Family Hx: mother had colon cancer, died at 18, no heart disease; father died at 66's, valve, chf  Social Hx: former smoker (quit 25+ yrs ago, 2 ppd x 10-15 yrs); no alcohol  Diet: no processed food; more chicken, eggs; some vegetables  Exercise: limited by DOE, was given information  on PREP  Home BP readings: 10 readings in past month averaged 157/97.  Range 140-174/82-111  Intolerances: lisinopril, amlodipine, hctz, beta blockers, hydralazine  Labs: 7/21:  Na 139, K 4.3, Glu 142, BUN 29, SCr 2.17, GFR 32  Wt Readings from Last 3 Encounters:  02/20/20 222 lb 6.4 oz (100.9 kg)  02/20/20 220 lb 6.4 oz (100 kg)  02/06/20 222 lb (100.7 kg)   BP Readings from Last 3 Encounters:  02/20/20 (!) 180/110  02/20/20 (!) 220/130  02/06/20 134/88   Pulse Readings from Last 3 Encounters:  02/20/20 87  02/20/20 87  02/06/20 84    Current Outpatient Medications  Medication Sig Dispense Refill  . acetaminophen (TYLENOL) 500 MG tablet Take 1,000 mg by mouth every 6 (six) hours as needed for moderate pain or headache.     Marland Kitchen aspirin EC 81 MG tablet Take 81 mg by mouth daily.    . [place]wellia-Glucosamine-Vit D (OSTEO BI-FLEX ONE PER DAY PO) Take 1 tablet by mouth daily.     . Cholecalciferol (VITAMIN D-3) 125 MCG (5000 UT) TABS Take  5,000 Units by mouth daily.    . CHROMIUM PICOLINATE MEGA PO Take 1 capsule by mouth daily.    Marland Kitchen CINNAMON PO Take 1 capsule by mouth daily.    . Coenzyme Q10 (COQ10 PO) Take 1 tablet by mouth daily.    . Cyanocobalamin (B-12) 2500 MCG TABS Take 2,500 mcg by mouth daily.    Marland Kitchen doxazosin (CARDURA) 4 MG tablet Take 1 tablet (4 mg total) by mouth at bedtime. 90 tablet 1  . furosemide (LASIX) 40 MG tablet TAKE 1 AND 1/2 TABLETS BY MOUTH DAILY 135 tablet 1  . glipiZIDE (GLUCOTROL) 5 MG tablet Take 0.5 tablets (2.5 mg total) by mouth daily before breakfast. 15 tablet 3  . HYDROcodone-acetaminophen (NORCO/VICODIN) 5-325 MG tablet Take 1 tablet by mouth every 6 (six) hours as needed for moderate pain. 20 tablet 0  . insulin glargine (LANTUS SOLOSTAR) 100 UNIT/ML Solostar Pen Inject 25 Units into the skin every morning. 5 pen PRN  . Insulin Pen Needle (B-D ULTRAFINE III SHORT PEN) 31G X 8 MM MISC 1 each by Does not apply route as directed. 100 each 3  . Magnesium 100 MG CAPS Take 1 capsule by mouth daily.    . Multiple Vitamin (MULTIVITAMIN WITH MINERALS) TABS tablet Take 1 tablet by mouth daily.    . Multiple Vitamins-Minerals (ZINC PO) Take 1 tablet by mouth daily.    . sertraline (ZOLOFT) 100 MG tablet Take 100 mg by mouth daily.    . valsartan (DIOVAN) 80 MG tablet Take 1 tablet (80 mg total) by mouth daily. 30 tablet 6   No current facility-administered medications for this visit.    Allergies  Allergen Reactions  . Bee Venom Anaphylaxis  . Atorvastatin     myalgia    Past Medical History:  Diagnosis Date  . CAD (coronary artery disease) 03/03/2019  . Hypertension   . Type 2 diabetes mellitus with diabetic nephropathy ([place]) 04/23/2019    Blood pressure (!) 220/130, pulse 87, resp. rate 17, height 5\' 10"  (1.778 m), weight 220 lb 6.4 oz (100 kg), SpO2 95 %.  Essential hypertension, benign Patient with systolic/diastolic hypertension, currently only on doxazosin 4 mg qhs.  Has tolerated  this without problem.  Explained need for ACEI/ARB for better control of both systolic and diastolic readings.  Will start him on a low dose valsartan 80 mg.  Once he tolerates this, we  can hopefully move up to a more therapeutic dose.  He should continue with daily home BP checks and return in 1 month for follow up.     Tommy Medal PharmD CPP [facility] [address] [place], [place] [address] [phone]

## 2020-02-20 NOTE — Patient Instructions (Signed)
Return for a a follow up appointment October 7 at 2 pm  Go to the lab in 2 weeks  Check your blood pressure at home twice daily and keep record of the readings.  Take your BP meds as follows:  Start valsartan 80 mg once daily  Continue with the doxazosin 4 mg each night  Bring all of your meds, your BP cuff and your record of home blood pressures to your next appointment.  Exercise as you're able, try to walk approximately 30 minutes per day.  Keep salt intake to a minimum, especially watch canned and prepared boxed foods.  Eat more fresh fruits and vegetables and fewer canned items.  Avoid eating in fast food restaurants.    HOW TO TAKE YOUR BLOOD PRESSURE: . Rest 5 minutes before taking your blood pressure. .  Don't smoke or drink caffeinated beverages for at least 30 minutes before. . Take your blood pressure before (not after) you eat. . Sit comfortably with your back supported and both feet on the floor (don't cross your legs). . Elevate your arm to heart level on a table or a desk. . Use the proper sized cuff. It should fit smoothly and snugly around your bare upper arm. There should be enough room to slip a fingertip under the cuff. The bottom edge of the cuff should be 1 inch above the crease of the elbow. . Ideally, take 3 measurements at one sitting and record the average.

## 2020-02-21 ENCOUNTER — Ambulatory Visit (INDEPENDENT_AMBULATORY_CARE_PROVIDER_SITE_OTHER): Payer: Medicaid Other | Admitting: Neurology

## 2020-02-21 ENCOUNTER — Encounter: Payer: Self-pay | Admitting: Pharmacist Clinician (PhC)/ Clinical Pharmacy Specialist

## 2020-02-21 DIAGNOSIS — Z951 Presence of aortocoronary bypass graft: Secondary | ICD-10-CM

## 2020-02-21 DIAGNOSIS — R0683 Snoring: Secondary | ICD-10-CM

## 2020-02-21 DIAGNOSIS — G4720 Circadian rhythm sleep disorder, unspecified type: Secondary | ICD-10-CM

## 2020-02-21 DIAGNOSIS — G4733 Obstructive sleep apnea (adult) (pediatric): Secondary | ICD-10-CM | POA: Diagnosis not present

## 2020-02-21 DIAGNOSIS — R9431 Abnormal electrocardiogram [ECG] [EKG]: Secondary | ICD-10-CM

## 2020-02-21 DIAGNOSIS — R351 Nocturia: Secondary | ICD-10-CM

## 2020-02-21 DIAGNOSIS — I5032 Chronic diastolic (congestive) heart failure: Secondary | ICD-10-CM

## 2020-02-21 DIAGNOSIS — E669 Obesity, unspecified: Secondary | ICD-10-CM

## 2020-02-21 DIAGNOSIS — G4719 Other hypersomnia: Secondary | ICD-10-CM

## 2020-02-21 DIAGNOSIS — R0681 Apnea, not elsewhere classified: Secondary | ICD-10-CM

## 2020-02-21 NOTE — Assessment & Plan Note (Signed)
Patient with systolic/diastolic hypertension, currently only on doxazosin 4 mg qhs.  Has tolerated this without problem.  Explained need for ACEI/ARB for better control of both systolic and diastolic readings.  Will start him on a low dose valsartan 80 mg.  Once he tolerates this, we can hopefully move up to a more therapeutic dose.  He should continue with daily home BP checks and return in 1 month for follow up.

## 2020-02-26 ENCOUNTER — Ambulatory Visit: Payer: Medicaid Other | Admitting: Internal Medicine

## 2020-02-26 ENCOUNTER — Encounter: Payer: Self-pay | Admitting: Cardiovascular Disease

## 2020-02-26 ENCOUNTER — Other Ambulatory Visit: Payer: Self-pay

## 2020-02-26 ENCOUNTER — Encounter: Payer: Self-pay | Admitting: Internal Medicine

## 2020-02-26 VITALS — BP 124/80 | HR 83 | Temp 98.4°F | Ht 70.0 in | Wt 220.0 lb

## 2020-02-26 DIAGNOSIS — I25810 Atherosclerosis of coronary artery bypass graft(s) without angina pectoris: Secondary | ICD-10-CM

## 2020-02-26 DIAGNOSIS — R0602 Shortness of breath: Secondary | ICD-10-CM | POA: Diagnosis not present

## 2020-02-26 MED ORDER — ALBUTEROL SULFATE HFA 108 (90 BASE) MCG/ACT IN AERS: 2.0000 | INHALATION_SPRAY | Freq: Four times a day (QID) | RESPIRATORY_TRACT | 5 refills | Status: DC | PRN

## 2020-02-26 NOTE — Progress Notes (Signed)
[name]    [id number]    1955/11/19  Primary Care Physician:[name], [name], MD  Referring Physician: [name], [address] [place],   [id number] Reason for Consultation: shortness of breath Date of Consultation: 02/26/2020  Chief complaint:   Chief Complaint  Patient presents with  . Consult    SHOB      HPI: [name] is a 64 y.o. gentleman who presents for new patient evaluation for dyspnea progressive over several months. Now to the point where he has dyspnea on ADLs such as getting ready in the morning and taking a shower. If he exerts himself he feels that he has to take the whole next day to rest.  Had CABG in 2020 and feels that the symptoms all started after this. He did not do cardiac rehab after surgery, but is just now starting it. No chest pain but does have chest heaviness. He does cough usually at night time. He does have reflux and takes pepcid.   Does wake up coughing at night.   Overall having low energy and light headed/dizzy. Has taken inhalers in the past but nothing recent. Thinks they helped in the past. Undergoing OSA evaluation with neurology. Waiting on PSG results.   Social history:  Occupation: Marketing executive, Forensic psychologist.  Smoking history: former 2ppd x 15 years. Quit in 1990.   Social History   Occupational History  . Not on file  Tobacco Use  . Smoking status: Former Smoker    Quit date: 07/06/1988    Years since quitting: 31.6  . Smokeless tobacco: Never Used  Vaping Use  . Vaping Use: Never used  Substance and Sexual Activity  . Alcohol use: Not Currently    Comment: socially  . Drug use: No  . Sexual activity: Not on file    Relevant family history:  Family History  Problem Relation Age of Onset  . Colon cancer Mother   . Heart Problems Father   . Diabetes Father   . Valvular heart disease Father     Past Medical History:  Diagnosis Date  . CAD (coronary artery  disease) 03/03/2019  . Hypertension   . Type 2 diabetes mellitus with diabetic nephropathy ([place]) 04/23/2019    Past Surgical History:  Procedure Laterality Date  . BIOPSY  06/27/2019   Procedure: BIOPSY;  Surgeon: Rogene Houston, MD;  [facility];  Service: Endoscopy;;  ascending colon polyp  . CARDIAC SURGERY    . COLONOSCOPY N/A 06/27/2019   Procedure: COLONOSCOPY;  Surgeon: Rogene Houston, MD;  [facility];  Service: Endoscopy;  Laterality: N/A;  1030am-office rescheduled to 06/27/19 @9 :55am  . CORONARY ARTERY BYPASS GRAFT N/A 03/25/2019   Procedure: CORONARY ARTERY BYPASS GRAFTING (CABG) X  4 USING LEFT INTERNAL MAMMARY ARTERY AND RIGHT SAPHENOUS VEIN GRAFTS;  Surgeon: [name], MD;  Location: [facility];  Service: Open Heart Surgery;  Laterality: N/A;  . EXCISION MORTON'S NEUROMA    . POLYPECTOMY  06/27/2019   Procedure: POLYPECTOMY;  Surgeon: Rogene Houston, MD;  [facility];  Service: Endoscopy;;  proximal sigmoid colon  . RIGHT/LEFT HEART CATH AND CORONARY ANGIOGRAPHY N/A 03/12/2019   Procedure: RIGHT/LEFT HEART CATH AND CORONARY ANGIOGRAPHY;  Surgeon: [name], MD;  Location: [facility];  Service: Cardiovascular;  Laterality: N/A;  . TEE WITHOUT CARDIOVERSION N/A 03/25/2019   Procedure: TRANSESOPHAGEAL ECHOCARDIOGRAM (TEE);  Surgeon: [name], MD;  Location: [facility];  Service: Open Heart Surgery;  Laterality: N/A;     Physical Exam: Blood pressure 124/80, pulse 83, temperature 98.4 F (36.9 C), temperature source Oral, height 5\' 10"  (1.778 m), weight 220 lb (99.8 kg), SpO2 97 %. Gen:      No acute distress ENT:  no nasal polyps, mucus membranes moist +cobblestoning Lungs:    No increased respiratory effort, symmetric chest wall excursion, clear to auscultation bilaterally, no wheezes or crackles CV:         Regular rate and rhythm; no murmurs, rubs, or gallops.  No pedal edema. Well healed sternotomy  incision. Abd:      + bowel sounds; soft, non-tender; no distension MSK: no acute synovitis of DIP or PIP joints, no mechanics hands.  Skin:      Warm and dry; no rashes Neuro: normal speech, no focal facial asymmetry Psych: alert and oriented x3, normal mood and affect   Data Reviewed/Medical Decision Making:  Independent interpretation of tests: Imaging: . Review of patient's chest xray July 2021 images revealed low lung volumes. The patient's images have been independently reviewed by me.    Echo April 2021   1. Left ventricular ejection fraction, by estimation, is 55 to 60%. The  left ventricle has normal function. The left ventricle has no regional  wall motion abnormalities. There is moderate left ventricular hypertrophy.  Left ventricular diastolic  parameters are consistent with Grade I diastolic dysfunction (impaired  relaxation).  2. Right ventricular systolic function is normal. The right ventricular  size is normal.  3. Left atrial size was severely dilated.  4. The mitral valve is normal in structure. Trivial mitral valve  regurgitation. No evidence of mitral stenosis.  5. The aortic valve was not well visualized. Aortic valve regurgitation  is not visualized. No aortic stenosis is present.  6. The inferior vena cava is normal in size with greater than 50%  respiratory variability, suggesting right atrial pressure of 3 mmHg.   PFTs: None on file  Labs:  Lab Results  Component Value Date   WBC 8.5 01/09/2020   HGB 13.1 01/09/2020   HCT 39.0 01/09/2020   MCV 85 01/09/2020   PLT 266 01/09/2020     Immunization status:  Immunization History  Administered Date(s) Administered  . Janssen (J&J) SARS-COV-2 Vaccination 09/21/2019  . Pneumococcal Polysaccharide-23 07/16/2018  . Zoster Recombinat (Shingrix) 04/25/2019    . I reviewed prior external note(s) from cardiology . I reviewed the result(s) of the labs and imaging as noted above.  . I have  ordered PFTs  Assessment:  Shortness of breath CAD s/p CABG Possible OSA  Plan/Recommendations: Will await results of sleep study. I will also order a full set of PFTs to further classify his dyspnea.  Will start a trial of albuterol Highly recommend completing cardiac rehab. I think this will help his fatigue substantially.   We discussed disease management and progression at length today.   Return to Care: Return in about 4 weeks (around 03/25/2020).  Lenice Llamas, MD [facility] [place]  CC: [name], MD

## 2020-02-26 NOTE — Patient Instructions (Addendum)
The patient should have follow up scheduled with myself in 1 months.   Prior to next visit patient should have: Full set of PFTs    Take the albuterol rescue inhaler every 4 to 6 hours as needed for wheezing or shortness of breath. You can also take it 15 minutes before exercise or exertional activity. Side effects include heart racing or pounding, jitters or anxiety. If you have a history of an irregular heart rhythm, it can make this worse. Can also give some patients a hard time sleeping.  To inhale the aerosol using an inhaler, follow these steps:  1. Remove the protective dust cap from the end of the mouthpiece. If the dust cap was not placed on the mouthpiece, check the mouthpiece for dirt or other objects. Be sure that the canister is fully and firmly inserted in the mouthpiece. 2. If you are using the inhaler for the first time or if you have not used the inhaler in more than 14 days, you will need to prime it. You may also need to prime the inhaler if it has been dropped. Ask your pharmacist or check the manufacturer's information if this happens. To prime the inhaler, shake it well and then press down on the canister 4 times to release 4 sprays into the air, away from your face. Be careful not to get albuterol in your eyes. 3. Shake the inhaler well. 4. Breathe out as completely as possible through your mouth. 4. Hold the canister with the mouthpiece on the bottom, facing you and the canister pointing upward. Place the open end of the mouthpiece into your mouth. Close your lips tightly around the mouthpiece. 6. Breathe in slowly and deeply through the mouthpiece.At the same time, press down once on the container to spray the medication into your mouth. 7. Try to hold your breath for 10 seconds. remove the inhaler, and breathe out slowly. 8. If you were told to use 2 puffs, wait 1 minute and then repeat steps 3-7. 9. Replace the protective cap on the inhaler. 10. Clean your inhaler  regularly. Follow the manufacturer's directions carefully and ask your doctor or pharmacist if you have any questions about cleaning your inhaler.  Check the back of the inhaler to keep track of the total number of doses left on the inhaler.    

## 2020-02-27 NOTE — Addendum Note (Signed)
Addended by: Star Age on: 02/27/2020 06:48 PM   Modules accepted: Orders

## 2020-02-27 NOTE — Progress Notes (Signed)
Patient referred by Levell July, NP with cardiology, seen by me on 02/06/20, diagnostic PSG on 02/21/20.   Please call and notify the patient that the recent sleep study showed severe obstructive sleep apnea (he did not achieve any dream sleep). I recommend treatment in the form of CPAP. This will require a repeat sleep study for proper titration and mask fitting and correct monitoring of the oxygen saturations. Please explain to patient. I have placed an order in the chart. Thanks.  Star Age, MD, PhD [facility])

## 2020-02-27 NOTE — Procedures (Signed)
PATIENT'S NAME:  [name], [name] DOB:      Feb 24, 1956      MR#:    [id number]     DATE OF RECORDING: 02/21/2020 REFERRING M.D.:  Karle Plumber, NP Study Performed:   Baseline Polysomnogram HISTORY: 64 year old man with a history of diabetes, hypertension, coronary artery disease with status post four-vessel bypass in 03/2019, CHF, and obesity, who reports snoring and excessive daytime somnolence. The patient endorsed the Epworth Sleepiness Scale at 5 points. The patient's weight 223 pounds with a height of 70 (inches), resulting in a BMI of 31.9 kg/m2. The patient's neck circumference measured 19 inches.  CURRENT MEDICATIONS: Tylenol, ASA 81mg , Lipitor, Osteo Bi-Flex, Vit D3, Chromium, B-12, Cardura, Lasix, Glucotrol, Vicodin, Lantus, Insulin pen, Magnesium, Multivitamin with minerals, Zoloft   PROCEDURE:  This is a multichannel digital polysomnogram utilizing the Somnostar 11.2 system.  Electrodes and sensors were applied and monitored per AASM Specifications.   EEG, EOG, Chin and Limb EMG, were sampled at 200 Hz.  ECG, Snore and Nasal Pressure, Thermal Airflow, Respiratory Effort, CPAP Flow and Pressure, Oximetry was sampled at 50 Hz. Digital video and audio were recorded.      BASELINE STUDY  Lights Out was at 21:59 and Lights On at 04:45.  Total recording time (TRT) was 406.5 minutes, with a total sleep time (TST) of 344 minutes.   The patient's sleep latency was 60 minutes, which is delayed. REM sleep was absent. The sleep efficiency was 84.6 %.     SLEEP ARCHITECTURE: WASO (Wake after sleep onset) was 31 minutes with mild to moderate sleep fragmentation noted. There were 86 minutes in Stage N1, 237.5 minutes Stage N2, 20.5 minutes Stage N3 and 0 minutes in Stage REM.  The percentage of Stage N1 was 25%, which is markedly increased,  Stage N2 was 69.%, which is increased, Stage N3 was 6% and Stage R (REM sleep) was absent. The arousals were noted as: 50 were spontaneous, 0 were associated with  PLMs, 115 were associated with respiratory events.  RESPIRATORY ANALYSIS:  There were a total of 191 respiratory events:  73 obstructive apneas, 10 central apneas and 0 mixed apneas with a total of 83 apneas and an apnea index (AI) of 14.5 /hour. There were 108 hypopneas with a hypopnea index of 18.8 /hour. The patient also had 0 respiratory event related arousals (RERAs).      The total APNEA/HYPOPNEA INDEX (AHI) was 33.3/hour and the total RESPIRATORY DISTURBANCE INDEX was  33.3 /hour.  0 events occurred in REM sleep and 226 events in NREM. The REM AHI was n/a, versus a non-REM AHI of 33.3. The patient spent 69 minutes of total sleep time in the supine position and 275 minutes in non-supine. The supine AHI was 35.7 versus a non-supine AHI of 32.7.  OXYGEN SATURATION & C02:  The Wake baseline 02 saturation was 92%, with the lowest being 85%. Time spent below 89% saturation equaled 8 minutes.   PERIODIC LIMB MOVEMENTS: The patient had a total of 0 Periodic Limb Movements.  The Periodic Limb Movement (PLM) index was 0 and the PLM Arousal index was 0/hour.  Audio and video analysis did not show any abnormal or unusual movements, behaviors, phonations or vocalizations. The patient took no bathroom breaks. Mild to moderate snoring was noted. The EKG was in keeping with normal sinus rhythm (NSR) with occasional PVCs noted.  Post-study, the patient indicated that sleep was worse than usual.   IMPRESSION: 1. Obstructive Sleep Apnea (OSA) 2. Dysfunctions associated  with sleep stages or arousal from sleep 3. Non-specific abnormal EKG  RECOMMENDATIONS: 1. This study demonstrates severe obstructive sleep apnea, with a total AHI of 33.3/hour and O2 nadir of 85%.  The absence of REM sleep during the study likely underestimates his sleep disordered breathing.  Treatment with positive airway pressure in the form of CPAP is recommended. This will require a full night titration study to optimize therapy. Other  treatment options may be limited due to the severity of his sleep apnea, but - generally speaking - may include avoidance of supine sleep position along with weight loss, upper airway or jaw surgery in selected patients or the use of an oral appliance in certain patients. ENT evaluation and/or consultation with a maxillofacial surgeon or dentist may be feasible in some instances.    2. Please note that untreated obstructive sleep apnea may carry additional perioperative morbidity. Patients with significant obstructive sleep apnea should receive perioperative PAP therapy and the surgeons and particularly the anesthesiologist should be informed of the diagnosis and the severity of the sleep disordered breathing. 3. This study shows sleep fragmentation and abnormal sleep stage percentages; these are nonspecific findings and per se do not signify an intrinsic sleep disorder or a cause for the patient's sleep-related symptoms. Causes include (but are not limited to) the first night effect of the sleep study, circadian rhythm disturbances, medication effect or an underlying mood disorder or medical problem.  4. The patient should be cautioned not to drive, work at heights, or operate dangerous or heavy equipment when tired or sleepy. Review and reiteration of good sleep hygiene measures should be pursued with any patient. 5. The study showed occasional PVCs on single lead EKG; clinical correlation is recommended. The patient is followed by cardiology.  6. The patient will be seen in follow-up in the sleep clinic at [facility] for discussion of the test results, symptom and treatment compliance review, further management strategies, etc. The referring provider will be notified of the test results.  I certify that I have reviewed the entire raw data recording prior to the issuance of this report in accordance with the Standards of Accreditation of the American Academy of Sleep Medicine (AASM)  Star Age, MD,  PhD Diplomat, American Board of Neurology and Sleep Medicine (Neurology and Sleep Medicine)

## 2020-03-02 ENCOUNTER — Telehealth: Payer: Self-pay

## 2020-03-02 NOTE — Telephone Encounter (Signed)
I called pt. I advised pt that Dr. Rexene Alberts reviewed their sleep study results and found that severe osa was present and recommends that pt be treated with a cpap. Dr. Rexene Alberts recommends that pt return for a repeat sleep study in order to properly titrate the cpap and ensure a good mask fit. Pt is agreeable to returning for a titration study. I advised pt that our sleep lab will file with pt's insurance and call pt to schedule the sleep study when we hear back from the pt's insurance regarding coverage of this sleep study. Pt verbalized understanding of results. Pt had no questions at this time but was encouraged to call back if questions arise.

## 2020-03-02 NOTE — Telephone Encounter (Signed)
-----   Message from Star Age, MD sent at 02/27/2020  6:48 PM EDT ----- Patient referred by [name] July, NP with cardiology, seen by me on 02/06/20, diagnostic PSG on 02/21/20.   Please call and notify the patient that the recent sleep study showed severe obstructive sleep apnea (he did not achieve any dream sleep). I recommend treatment in the form of CPAP. This will require a repeat sleep study for proper titration and mask fitting and correct monitoring of the oxygen saturations. Please explain to patient. I have placed an order in the chart. Thanks.  Star Age, MD, PhD [facility])

## 2020-03-05 DIAGNOSIS — M25511 Pain in right shoulder: Secondary | ICD-10-CM | POA: Diagnosis not present

## 2020-03-05 DIAGNOSIS — M25512 Pain in left shoulder: Secondary | ICD-10-CM | POA: Diagnosis not present

## 2020-03-09 ENCOUNTER — Other Ambulatory Visit: Payer: Self-pay | Admitting: Family Medicine

## 2020-03-10 ENCOUNTER — Ambulatory Visit (INDEPENDENT_AMBULATORY_CARE_PROVIDER_SITE_OTHER): Payer: Medicaid Other | Admitting: Family Medicine

## 2020-03-10 ENCOUNTER — Encounter: Payer: Self-pay | Admitting: Family Medicine

## 2020-03-10 ENCOUNTER — Other Ambulatory Visit: Payer: Self-pay

## 2020-03-10 VITALS — BP 128/84 | HR 97 | Temp 95.5°F | Ht 67.0 in | Wt 219.6 lb

## 2020-03-10 DIAGNOSIS — E785 Hyperlipidemia, unspecified: Secondary | ICD-10-CM | POA: Diagnosis not present

## 2020-03-10 DIAGNOSIS — M25511 Pain in right shoulder: Secondary | ICD-10-CM

## 2020-03-10 DIAGNOSIS — E1159 Type 2 diabetes mellitus with other circulatory complications: Secondary | ICD-10-CM | POA: Diagnosis not present

## 2020-03-10 DIAGNOSIS — M791 Myalgia, unspecified site: Secondary | ICD-10-CM | POA: Diagnosis not present

## 2020-03-10 DIAGNOSIS — T466X5A Adverse effect of antihyperlipidemic and antiarteriosclerotic drugs, initial encounter: Secondary | ICD-10-CM

## 2020-03-10 DIAGNOSIS — Z0000 Encounter for general adult medical examination without abnormal findings: Secondary | ICD-10-CM

## 2020-03-10 DIAGNOSIS — E1169 Type 2 diabetes mellitus with other specified complication: Secondary | ICD-10-CM

## 2020-03-10 DIAGNOSIS — I10 Essential (primary) hypertension: Secondary | ICD-10-CM | POA: Diagnosis not present

## 2020-03-10 MED ORDER — SERTRALINE HCL 100 MG PO TABS: 100.0000 mg | ORAL_TABLET | Freq: Every day | ORAL | 1 refills | Status: DC

## 2020-03-10 MED ORDER — HYDROCODONE-ACETAMINOPHEN 5-325 MG PO TABS: 1.0000 | ORAL_TABLET | Freq: Four times a day (QID) | ORAL | 0 refills | Status: DC | PRN

## 2020-03-10 NOTE — Progress Notes (Signed)
Subjective:    Patient ID: [name], male    DOB: [birth year], 64 y.o.   MRN: [id number]  HPI The patient comes in today for a wellness visit. Diabetes under subpar control Sugars running high consistently Needs tried adjusting his medicines on his own He has seen a couple different endocrinologist He would like to see a different endocrinologist because he does not click with the current 1.  Also patient has elevated cholesterol does not tolerate statins   A review of their health history was completed.  A review of medications was also completed.  Any needed refills; Zoloft States his moods overall are doing well denies being depressed anxiety under good control Eating habits: trying to eat healthy   Falls/  MVA accidents in past few months: none  Regular exercise: in rehab for heart bypass  Specialist pt sees on regular basis: nephrologist, cardiology, ortho, pulmonary, neurology   Preventative health issues were discussed.   Additional concerns: Would like new diabetic dr. Had horrible experience with Dr.Nida and will not refill Glipizide. Had sleep study; will need another one tomorrow night   Review of Systems  Constitutional: Negative for diaphoresis and fatigue.  HENT: Negative for congestion and rhinorrhea.   Respiratory: Negative for cough and shortness of breath.   Cardiovascular: Negative for chest pain and leg swelling.  Gastrointestinal: Negative for abdominal pain and diarrhea.  Skin: Negative for color change and rash.  Neurological: Negative for dizziness and headaches.  Psychiatric/Behavioral: Negative for behavioral problems and confusion.       Objective:   Physical Exam Vitals reviewed.  Constitutional:      General: He is not in acute distress. HENT:     Head: Normocephalic and atraumatic.  Eyes:     General:        Right eye: No discharge.        Left eye: No discharge.  Neck:     Trachea: No tracheal deviation.    Cardiovascular:     Rate and Rhythm: Normal rate and regular rhythm.     Heart sounds: Normal heart sounds. No murmur heard.   Pulmonary:     Effort: Pulmonary effort is normal. No respiratory distress.     Breath sounds: Normal breath sounds.  Lymphadenopathy:     Cervical: No cervical adenopathy.  Skin:    General: Skin is warm and dry.  Neurological:     Mental Status: He is alert.     Coordination: Coordination normal.  Psychiatric:        Behavior: Behavior normal.     Prostate exam normal Blood pressure good     Assessment & Plan:  DM type 2 causing vascular disease ([place]) - Plan: Ambulatory referral to Endocrinology  Acute pain of right shoulder - Plan: HYDROcodone-acetaminophen (NORCO/VICODIN) 5-325 MG tablet  Well adult exam med 1. DM type 2 causing vascular disease ([place]) Not under good control.  Patient desires to see endocrinology in [place] we will help set this up - Ambulatory referral to Endocrinology  2. Acute pain of right shoulder Will use hydrocodone as needed for shoulder recommend follow-up with orthopedics range of motion exercises recommended - HYDROcodone-acetaminophen (NORCO/VICODIN) 5-325 MG tablet; Take 1 tablet by mouth every 6 (six) hours as needed for moderate pain.  Dispense: 20 tablet; Refill: 0  3. Well adult exam Adult wellness-complete.wellness physical was conducted today. Importance of diet and exercise were discussed in detail.  In addition to this a discussion regarding safety was also  covered. We also reviewed over immunizations and gave recommendations regarding current immunization needed for age.  In addition to this additional areas were also touched on including: Preventative health exams needed:  Colonoscopy 2026  Patient was advised yearly wellness exam   4. Hyperlipidemia associated with type 2 diabetes mellitus ([place]) Patient does not do well with statins We will connect with cardiology to see if they can refer him  to the lipid clinic so they could do injectables

## 2020-03-11 ENCOUNTER — Ambulatory Visit (INDEPENDENT_AMBULATORY_CARE_PROVIDER_SITE_OTHER): Payer: Medicaid Other | Admitting: Neurology

## 2020-03-11 DIAGNOSIS — G4719 Other hypersomnia: Secondary | ICD-10-CM

## 2020-03-11 DIAGNOSIS — T466X5A Adverse effect of antihyperlipidemic and antiarteriosclerotic drugs, initial encounter: Secondary | ICD-10-CM | POA: Insufficient documentation

## 2020-03-11 DIAGNOSIS — R351 Nocturia: Secondary | ICD-10-CM

## 2020-03-11 DIAGNOSIS — G4733 Obstructive sleep apnea (adult) (pediatric): Secondary | ICD-10-CM | POA: Diagnosis not present

## 2020-03-11 DIAGNOSIS — R9431 Abnormal electrocardiogram [ECG] [EKG]: Secondary | ICD-10-CM

## 2020-03-11 DIAGNOSIS — M791 Myalgia, unspecified site: Secondary | ICD-10-CM | POA: Insufficient documentation

## 2020-03-11 DIAGNOSIS — Z9989 Dependence on other enabling machines and devices: Secondary | ICD-10-CM

## 2020-03-11 DIAGNOSIS — I5032 Chronic diastolic (congestive) heart failure: Secondary | ICD-10-CM

## 2020-03-11 DIAGNOSIS — E669 Obesity, unspecified: Secondary | ICD-10-CM

## 2020-03-11 DIAGNOSIS — G4720 Circadian rhythm sleep disorder, unspecified type: Secondary | ICD-10-CM

## 2020-03-11 DIAGNOSIS — G4739 Other sleep apnea: Secondary | ICD-10-CM

## 2020-03-11 DIAGNOSIS — Z951 Presence of aortocoronary bypass graft: Secondary | ICD-10-CM

## 2020-03-11 LAB — BASIC METABOLIC PANEL
BUN/Creatinine Ratio: 13 (ref 10–24)
BUN: 30 mg/dL — ABNORMAL HIGH (ref 8–27)
CO2: 21 mmol/L (ref 20–29)
Calcium: 9.2 mg/dL (ref 8.6–10.2)
Chloride: 105 mmol/L (ref 96–106)
Creatinine, Ser: 2.23 mg/dL — ABNORMAL HIGH (ref 0.76–1.27)
GFR calc Af Amer: 35 mL/min/{1.73_m2} — ABNORMAL LOW (ref >59)
GFR calc non Af Amer: 30 mL/min/{1.73_m2} — ABNORMAL LOW (ref >59)
Glucose: 141 mg/dL — ABNORMAL HIGH (ref 65–99)
Potassium: 4.4 mmol/L (ref 3.5–5.2)
Sodium: 141 mmol/L (ref 134–144)

## 2020-03-17 NOTE — Addendum Note (Signed)
Addended by: Star Age on: 03/17/2020 05:44 PM   Modules accepted: Orders

## 2020-03-17 NOTE — Progress Notes (Signed)
Patient referred by [name] July, NP with cardiology, seen by me on 02/06/20, diagnostic PSG on 02/21/20.  Patient had a CPAP titration study on 03/11/20.  Please call and inform patient that I have entered an order for treatment with positive airway pressure (PAP) treatment for obstructive sleep apnea (OSA) in the form of ASV which is a more sophisticated type of treatment machine as he did not have good success with treatment of his sleep apnea with just CPAP.  They also tried him on a different machine called BiPAP.  Unfortunately, he did not have optimal results with either CPAP or BiPAP alone and did have better results with a machine called ASV which stands for adaptive servo ventilation.  I will place an order in his chart.  We will send this off to a DME company.  He will need a follow-up within 60 to 90 days after starting treatment.    Star Age, MD, PhD [facility])

## 2020-03-17 NOTE — Procedures (Signed)
PATIENT'S NAME:  [name], [name] DOB:      Oct 23, 1955      MR#:    [id number]     DATE OF RECORDING: 03/11/2020 REFERRING M.D.:  [name], MD Study Performed:   CPAP  Titration HISTORY: 64 year old man with a history of diabetes, hypertension, coronary artery disease with status post four-vessel bypass in 03/2019, CHF, and obesity, who presents for a full night titration for his severe OSA. His baseline sleep study from 02/21/20 showed severe obstructive sleep apnea, with a total AHI of 33.3/hour and O2 nadir of 85%.  The absence of REM sleep during the study likely underestimated his sleep disordered breathing. The patient endorsed the Epworth Sleepiness Scale at 5 points. The patient's weight 223 pounds with a height of 70 (inches), resulting in a BMI of 31.9 kg/m2. The patient's neck circumference measured 19 inches.  CURRENT MEDICATIONS: Tylenol, ASA 81mg , Lipitor, Osteo Bi-Flex, Vit D3, Chromium, B-12, Cardura, Lasix, Glucotrol, Vicodin, Lantus, Insulin pen, Magnesium, Multivitamin with minerals, Zoloft  PROCEDURE:  This is a multichannel digital polysomnogram utilizing the SomnoStar 11.2 system.  Electrodes and sensors were applied and monitored per AASM Specifications.   EEG, EOG, Chin and Limb EMG, were sampled at 200 Hz.  ECG, Snore and Nasal Pressure, Thermal Airflow, Respiratory Effort, CPAP Flow and Pressure, Oximetry was sampled at 50 Hz. Digital video and audio were recorded.      The patient was fitted with a medium F30i FFM from [employer]. The patient was started on CPAP of 5 cm and titrated to a pressure of 12 cm.  His AHI was elevated at 64/h at the time and he started having central apneas.  He was therefore changed to BiPAP of 13/9 centimeters and a backup rate was added for ongoing central apneas.  Once he entered REM sleep he had worsening of his sleep disordered breathing and was therefore switched to ASV with an EPAP of 12, maximum pressure support of 13, minimum pressure support  of 3.  The final ASV setting his AHI was 0/h, O2 nadir 87%, with nonsupine REM sleep achieved.  Lights Out was at 21:57 and Lights On at 05:01. Total recording time (TRT) was 420 minutes, with a total sleep time (TST) of 364.5 minutes. The patient's sleep latency was 22.5 minutes. REM latency was 112.5 minutes.  The sleep efficiency was 86.8 %.    SLEEP ARCHITECTURE: WASO (Wake after sleep onset) was 53 minutes with mild to moderate sleep fragmentation noted. There were 76.5 minutes in Stage N1, 241.5 minutes Stage N2, 21 minutes Stage N3 and 25.5 minutes in Stage REM.  The percentage of Stage N1 was 21.%, which is increased, Stage N2 was 66.3%, which is increased, Stage N3 was 5.8% and Stage R (REM sleep) was 7.%, which is reduced. The arousals were noted as: 23 were spontaneous, 0 were associated with PLMs, 49 were associated with respiratory events.  RESPIRATORY ANALYSIS:  There was a total of 127 respiratory events: 1 obstructive apneas, 32 central apneas and 0 mixed apneas with a total of 33 apneas and an apnea index (AI) of 5.4 /hour. There were 94 hypopneas with a hypopnea index of 15.5/hour. The patient also had 0 respiratory event related arousals (RERAs).      The total APNEA/HYPOPNEA INDEX  (AHI) was 20.9 /hour and the total RESPIRATORY DISTURBANCE INDEX was 20.9 /hour  5 events occurred in REM sleep and 122 events in NREM. The REM AHI was 11.8 /hour versus a non-REM AHI of 21.6 /  hour.  The patient spent 17.5 minutes of total sleep time in the supine position and 347 minutes in non-supine. The supine AHI was 58.3, versus a non-supine AHI of 19.0.  OXYGEN SATURATION & C02:  The baseline 02 saturation was 92%, with the lowest being 84%. Time spent below 89% saturation equaled 11 minutes.  PERIODIC LIMB MOVEMENTS:  The patient had a total of 0 Periodic Limb Movements. The Periodic Limb Movement (PLM) index was 0 and the PLM Arousal index was 0 /hour.  Audio and video analysis did not show any  abnormal or unusual movements, behaviors, phonations or vocalizations. The patient took no bathroom breaks. The EKG was in keeping with normal sinus rhythm (NSR) with occasional PVCs noted.  Post-study, the patient indicated that sleep was better than usual.  IMPRESSION: 1. Obstructive Sleep Apnea (OSA) 2. Insufficient treatment with CPAP 3. Treatment emergent central apneas 4. Dysfunctions associated with sleep stages or arousal from sleep 5. Non-specific abnormal EKG   RECOMMENDATIONS: 1. This study demonstrates resolution of the patient's obstructive sleep and central sleep apnea with ASV.  CPAP therapy was not sufficient to treat his severe sleep disordered breathing and BiPAP as well as BiPAP ST did not correct his ongoing central apneas.  I will ask the patient to start ASV treatment at home with an EPAP of 12, maximum pressure support of 13, minimum pressure support of 3.  A medium full facemask was used during the study. The patient should be reminded to be fully compliant with PAP therapy to improve sleep related symptoms and decrease long term cardiovascular risks. The patient should be reminded, that it may take up to 3 months to get fully used to using PAP with all planned sleep. The earlier full compliance is achieved, the better long term compliance tends to be. Please note that untreated obstructive sleep apnea may carry additional perioperative morbidity. Patients with significant obstructive sleep apnea should receive perioperative PAP therapy and the surgeons and particularly the anesthesiologist should be informed of the diagnosis and the severity of the sleep disordered breathing. 2. This study shows sleep fragmentation and abnormal sleep stage percentages; these are nonspecific findings and per se do not signify an intrinsic sleep disorder or a cause for the patient's sleep-related symptoms. Causes include (but are not limited to) the first night effect of the sleep study, circadian  rhythm disturbances, medication effect or an underlying mood disorder or medical problem.  3. The patient should be cautioned not to drive, work at heights, or operate dangerous or heavy equipment when tired or sleepy. Review and reiteration of good sleep hygiene measures should be pursued with any patient. 4. The study showed occasional PVCs on single lead EKG; clinical correlation is recommended. The patient is followed by cardiology.  5. The patient will be seen in follow-up in the sleep clinic at [facility] for discussion of the test results, symptom and treatment compliance review, further management strategies, etc. The referring provider will be notified of the test results.   I certify that I have reviewed the entire raw data recording prior to the issuance of this report in accordance with the Standards of Accreditation of the American Academy of Sleep Medicine (AASM)   Star Age, MD, PhD Diplomat, American Board of Neurology and Sleep Medicine (Neurology and Sleep Medicine)

## 2020-03-18 ENCOUNTER — Telehealth: Payer: Self-pay

## 2020-03-18 NOTE — Telephone Encounter (Addendum)
I called pt. I advised pt that Dr. Rexene Alberts reviewed their sleep study results and found that pt did best during most recent sleep study with ASV machine. Dr. Rexene Alberts recommends that pt start ASV at home for treatment. I reviewed PAP compliance expectations with the pt. Pt is agreeable to starting an auto-PAP. I advised pt that an order will be sent to a DME, [facility], and [employer] will call the pt within about one week after they file with the pt's insurance. [facility] will show the pt how to use the machine, fit for masks, and troubleshoot the auto-PAP if needed. A follow up appt was made for insurance purposes with Dr. Rexene Alberts  on 06/09/2020 at 930am . Pt verbalized understanding to arrive 15 minutes early and bring their auto-PAP. A letter with all of this information in it will be mailed to the pt as a reminder. I verified with the pt that the address we have on file is correct. Pt verbalized understanding of results. Pt had no questions at this time but was encouraged to call back if questions arise. I have sent the order to [employer] and have received confirmation that they have received the order.

## 2020-03-18 NOTE — Telephone Encounter (Signed)
-----   Message from Star Age, MD sent at 03/17/2020  5:44 PM EDT ----- Patient referred by Levell July, NP with cardiology, seen by me on 02/06/20, diagnostic PSG on 02/21/20.  Patient had a CPAP titration study on 03/11/20.  Please call and inform patient that I have entered an order for treatment with positive airway pressure (PAP) treatment for obstructive sleep apnea (OSA) in the form of ASV which is a more sophisticated type of treatment machine as he did not have good success with treatment of his sleep apnea with just CPAP.  They also tried him on a different machine called BiPAP.  Unfortunately, he did not have optimal results with either CPAP or BiPAP alone and did have better results with a machine called ASV which stands for adaptive servo ventilation.  I will place an order in his chart.  We will send this off to a DME company.  He will need a follow-up within 60 to 90 days after starting treatment.    Star Age, MD, PhD [facility])

## 2020-03-18 NOTE — Telephone Encounter (Signed)
[name],[name](partner on DPR) is asking for a call to be told exactly what pt was told since he has memory issues, please call.

## 2020-03-18 NOTE — Telephone Encounter (Signed)
I called pt's partner and reviewed results. She verbalized understanding and had no questions/concerns. She verbalized appreciation for the call.

## 2020-03-19 ENCOUNTER — Ambulatory Visit: Payer: Medicaid Other

## 2020-03-19 DIAGNOSIS — H25813 Combined forms of age-related cataract, bilateral: Secondary | ICD-10-CM | POA: Diagnosis not present

## 2020-03-19 DIAGNOSIS — E113591 Type 2 diabetes mellitus with proliferative diabetic retinopathy without macular edema, right eye: Secondary | ICD-10-CM | POA: Diagnosis not present

## 2020-03-19 DIAGNOSIS — H4313 Vitreous hemorrhage, bilateral: Secondary | ICD-10-CM | POA: Diagnosis not present

## 2020-03-20 ENCOUNTER — Ambulatory Visit (INDEPENDENT_AMBULATORY_CARE_PROVIDER_SITE_OTHER): Payer: Medicaid Other | Admitting: Pharmacist

## 2020-03-20 ENCOUNTER — Other Ambulatory Visit: Payer: Self-pay

## 2020-03-20 ENCOUNTER — Encounter: Payer: Self-pay | Admitting: Family Medicine

## 2020-03-20 VITALS — BP 138/96 | HR 88 | Wt 221.0 lb

## 2020-03-20 DIAGNOSIS — I10 Essential (primary) hypertension: Secondary | ICD-10-CM | POA: Diagnosis not present

## 2020-03-20 NOTE — Patient Instructions (Addendum)
Return for a  follow up appointment in 4 weeks   Check your blood pressure at home daily (if able) and keep record of the readings.  Take your BP meds as follows: *NO CHANGE IN MEDICATION*  Bring all of your meds, your BP cuff and your record of home blood pressures to your next appointment.  Exercise as you're able, try to walk approximately 30 minutes per day.  Keep salt intake to a minimum, especially watch canned and prepared boxed foods.  Eat more fresh fruits and vegetables and fewer canned items.  Avoid eating in fast food restaurants.    HOW TO TAKE YOUR BLOOD PRESSURE: . Rest 5 minutes before taking your blood pressure. .  Don't smoke or drink caffeinated beverages for at least 30 minutes before. . Take your blood pressure before (not after) you eat. . Sit comfortably with your back supported and both feet on the floor (don't cross your legs). . Elevate your arm to heart level on a table or a desk. . Use the proper sized cuff. It should fit smoothly and snugly around your bare upper arm. There should be enough room to slip a fingertip under the cuff. The bottom edge of the cuff should be 1 inch above the crease of the elbow. . Ideally, take 3 measurements at one sitting and record the average.

## 2020-03-20 NOTE — Progress Notes (Signed)
HPI:  [name] is a 64 y.o. male patient of Dr. Oval Linsey , with a PMH below who presents today for follow up in the advanced hypertension clinic.   He was not on any BP medications, as he stated everything tried had made him feel fatigued (see list below).  He was also unable to tolerate statins due to myalgias.   His main concern has been exertional dyspnea, which has not improved.  Patient was recently diagnosed with obstructive sleep apnea, and insufficient treatment with CPAP. Current recommendation is to initiate ASV (adaptive servo ventilation) and he is waiting for device delivery. Valsartan 80mg  daily was initiated during last OV with HTN clinic and patient is very happy with results. Denies problems with current therapy.  Click here for Secondary HTN Screening Tool (Then Refresh Note) Secondary Cause Data Comments  Herbals/Drugs  Screened:  01/20/2020 Not taking         Renovascular HTN             Sleep Apnea  Screened:  01/20/2020 No History Never evaluated  Patient denies snoring, states sleeps easily w/o concer  Hyperthyroidism  Screened:  04/29/2018 No History Previously ruled out  TSH 2.11  Hypothyroidism   Screened:  04/29/2018 No History Previously ruled out   TSH 2.11  Hyperaldosteronism             Pheochromocytoma  Screened:  01/20/2020 No History    Testing not indicated  Cushing's Syndrome  Screened:  01/20/2020 No History     Testing not indicated  Hyperparathyroidism             Coarctation of Aorta            Chemotherapeutic Agents        Compliance               Past Medical History: ASCVD S/p CABG x 4, PAD -   CHF Chronic systolic and diastolic 6/07 echo EF at 37-10% up from 62-69%, grade 1 diastolic dysfunction  hyperlipidemia 7/21:  TC 265, TG 234, HDL 44, LDL 177 - no meds  DM2 10/20: A1c 8.2 - glipizide 2.5 mg qd, Lantus qd  CKD3 7/21:  SCr 2.14, GFR 32 CrCl 50     Blood Pressure Goal:  130/80  Current Medications:  doxazosin 4 mg qhs  furosemide 40mg  daily Valsartan 80mg  daily  Intolerances: lisinopril, amlodipine, hctz, beta blockers, hydralazine  Family Hx: mother had colon cancer, died at 11, no heart disease; father died at 16's, valve, CHF  Social Hx: former smoker (quit 25+ yrs ago, 2 ppd x 10-15 yrs); no alcohol  Diet: no processed food; more chicken, eggs; some vegetables; increased protein, no snacks  Exercise: limited by DOE, was given information on PREP  Home BP readings:  17 readings since 9/20, average 137/92, HR range 82-96bpm  Labs: 7/21:  Na 139, K 4.3, Glu 142, BUN 29, SCr 2.17, GFR 32  Wt Readings from Last 3 Encounters:  03/20/20 221 lb (100.2 kg)  03/10/20 219 lb 9.6 oz (99.6 kg)  02/26/20 220 lb (99.8 kg)   BP Readings from Last 3 Encounters:  03/20/20 (!) 138/96  03/10/20 128/84  02/26/20 124/80   Pulse Readings from Last 3 Encounters:  03/20/20 88  03/10/20 97  02/26/20 83    Current Outpatient Medications  Medication Sig Dispense Refill  . acetaminophen (TYLENOL) 500 MG tablet Take 1,000 mg by mouth every 6 (six) hours as needed for  moderate pain or headache.     . albuterol (VENTOLIN HFA) 108 (90 Base) MCG/ACT inhaler Inhale 2 puffs into the lungs every 6 (six) hours as needed. 18 g 5  . aspirin EC 81 MG tablet Take 81 mg by mouth daily.    . [place]-Glucosamine-Vit D (OSTEO BI-FLEX ONE PER DAY PO) Take 1 tablet by mouth daily.     . Cholecalciferol (VITAMIN D-3) 125 MCG (5000 UT) TABS Take 5,000 Units by mouth daily.    . Coenzyme Q10 (COQ10 PO) Take 1 tablet by mouth daily.    . Cyanocobalamin (B-12) 2500 MCG TABS Take 2,500 mcg by mouth daily.    Marland Kitchen doxazosin (CARDURA) 4 MG tablet Take 1 tablet (4 mg total) by mouth at bedtime. 90 tablet 1  . furosemide (LASIX) 40 MG tablet TAKE 1 AND 1/2 TABLETS BY MOUTH DAILY (Patient taking differently: TAKE 1 TABLET BY MOUTH DAILY) 135 tablet 1  . glipiZIDE (GLUCOTROL) 5 MG tablet Take 0.5 tablets (2.5 mg total)  by mouth daily before breakfast. 15 tablet 3  . HYDROcodone-acetaminophen (NORCO/VICODIN) 5-325 MG tablet Take 1 tablet by mouth every 6 (six) hours as needed for moderate pain. 20 tablet 0  . insulin glargine (LANTUS SOLOSTAR) 100 UNIT/ML Solostar Pen Inject 25 Units into the skin every morning. 5 pen PRN  . Insulin Pen Needle (B-D ULTRAFINE III SHORT PEN) 31G X 8 MM MISC 1 each by Does not apply route as directed. 100 each 3  . Magnesium 100 MG CAPS Take 1 capsule by mouth daily.    . Multiple Vitamin (MULTIVITAMIN WITH MINERALS) TABS tablet Take 1 tablet by mouth daily.    . Multiple Vitamins-Minerals (ZINC PO) Take 1 tablet by mouth daily.    . sertraline (ZOLOFT) 100 MG tablet Take 1 tablet (100 mg total) by mouth daily. 90 tablet 1  . valsartan (DIOVAN) 80 MG tablet Take 1 tablet (80 mg total) by mouth daily. 30 tablet 6   No current facility-administered medications for this visit.    Allergies  Allergen Reactions  . Bee Venom Anaphylaxis  . Atorvastatin     myalgia    Past Medical History:  Diagnosis Date  . CAD (coronary artery disease) 03/03/2019  . Hypertension   . Type 2 diabetes mellitus with diabetic nephropathy ([place]) 04/23/2019    Blood pressure (!) 138/96, pulse 88, weight 221 lb (100.2 kg).  Essential hypertension, benign Blood pressure responded very well to Valsartan 80mg  daily and patient is very happy with the significantly improvement. He still waiting for ASV device (OSA insufficient treatment with CPAP).   Will continue current regimens without changes for now, and continue to work on positive lifestyle modifications. Patient stopped previous therapy due to increased fatigue and increasing valsartan prior to starting ASV, may worsen fatigue at this time.  Plan to increase valsartan to 160mg  daily during next office visit and titrate as needed to optimal response.    Aryelle Figg Rodriguez-Guzman PharmD, BCPS, [place] [id number] 03/23/2020 4:58 PM

## 2020-03-23 ENCOUNTER — Encounter: Payer: Self-pay | Admitting: Pharmacist

## 2020-03-23 NOTE — Assessment & Plan Note (Signed)
Blood pressure responded very well to Valsartan 80mg  daily and patient is very happy with the significantly improvement. He still waiting for ASV device (OSA insufficient treatment with CPAP).   Will continue current regimens without changes for now, and continue to work on positive lifestyle modifications. Patient stopped previous therapy due to increased fatigue and increasing valsartan prior to starting ASV, may worsen fatigue at this time.  Plan to increase valsartan to 160mg  daily during next office visit and titrate as needed to optimal response.

## 2020-03-27 ENCOUNTER — Ambulatory Visit (INDEPENDENT_AMBULATORY_CARE_PROVIDER_SITE_OTHER): Payer: Medicare Other | Admitting: Internal Medicine

## 2020-03-27 ENCOUNTER — Other Ambulatory Visit: Payer: Self-pay

## 2020-03-27 ENCOUNTER — Encounter: Payer: Self-pay | Admitting: Internal Medicine

## 2020-03-27 ENCOUNTER — Ambulatory Visit: Payer: Medicaid Other | Admitting: Internal Medicine

## 2020-03-27 VITALS — BP 120/90 | HR 94 | Temp 97.3°F | Ht 70.0 in | Wt 221.0 lb

## 2020-03-27 DIAGNOSIS — R0602 Shortness of breath: Secondary | ICD-10-CM

## 2020-03-27 DIAGNOSIS — I25810 Atherosclerosis of coronary artery bypass graft(s) without angina pectoris: Secondary | ICD-10-CM

## 2020-03-27 DIAGNOSIS — G4733 Obstructive sleep apnea (adult) (pediatric): Secondary | ICD-10-CM

## 2020-03-27 LAB — PULMONARY FUNCTION TEST
DL/VA % pred: 57 %
DL/VA: 2.41 ml/min/mmHg/L
DLCO cor % pred: 57 %
DLCO cor: 15.61 ml/min/mmHg
DLCO unc % pred: 57 %
DLCO unc: 15.65 ml/min/mmHg
FEF 25-75 Post: 4.74 L/sec
FEF 25-75 Pre: 4.03 L/sec
FEF2575-%Change-Post: 17 %
FEF2575-%Pred-Post: 167 %
FEF2575-%Pred-Pre: 142 %
FEV1-%Change-Post: 4 %
FEV1-%Pred-Post: 104 %
FEV1-%Pred-Pre: 99 %
FEV1-Post: 3.65 L
FEV1-Pre: 3.48 L
FEV1FVC-%Change-Post: 3 %
FEV1FVC-%Pred-Pre: 113 %
FEV6-%Change-Post: 1 %
FEV6-%Pred-Post: 93 %
FEV6-%Pred-Pre: 91 %
FEV6-Post: 4.14 L
FEV6-Pre: 4.07 L
FEV6FVC-%Pred-Post: 105 %
FEV6FVC-%Pred-Pre: 105 %
FVC-%Change-Post: 1 %
FVC-%Pred-Post: 88 %
FVC-%Pred-Pre: 87 %
FVC-Post: 4.14 L
FVC-Pre: 4.07 L
Post FEV1/FVC ratio: 88 %
Post FEV6/FVC ratio: 100 %
Pre FEV1/FVC ratio: 86 %
Pre FEV6/FVC Ratio: 100 %
RV % pred: 106 %
RV: 2.45 L
TLC % pred: 93 %
TLC: 6.55 L

## 2020-03-27 NOTE — Progress Notes (Signed)
Full PFT performed today. °

## 2020-03-27 NOTE — Progress Notes (Signed)
[name]    [id number]    1955/07/06  Primary Care Physician:[name], [name], MD Date of Appointment: 03/27/2020 Established Patient Visit  Chief complaint:   Chief Complaint  Patient presents with  . Follow-up    get pft results, sob and feeling bad for a month since he had surgery.  has osa (asv), no machines.  Managed with [facility]     HPI: [name] is a 64 y.o. gentleman who presented for dyspnea in September 2021. Symptoms ongoing since his CABG in fall of 2020. Did not do cardiac rehab at that time.   Interval Updates: Diagnosed with sleep apnea and prescribed ASV given central and obstructive component. Still waiting on ASV machine.  He had his PFTs today and is presented for follow-up.  I have reviewed the patient's family social and past medical history and updated as appropriate.   Past Medical History:  Diagnosis Date  . CAD (coronary artery disease) 03/03/2019  . Hypertension   . Type 2 diabetes mellitus with diabetic nephropathy ([place]) 04/23/2019    Past Surgical History:  Procedure Laterality Date  . BIOPSY  06/27/2019   Procedure: BIOPSY;  Surgeon: Rogene Houston, MD;  [facility];  Service: Endoscopy;;  ascending colon polyp  . CARDIAC SURGERY    . COLONOSCOPY N/A 06/27/2019   Procedure: COLONOSCOPY;  Surgeon: Rogene Houston, MD;  [facility];  Service: Endoscopy;  Laterality: N/A;  1030am-office rescheduled to 06/27/19 @9 :55am  . CORONARY ARTERY BYPASS GRAFT N/A 03/25/2019   Procedure: CORONARY ARTERY BYPASS GRAFTING (CABG) X  4 USING LEFT INTERNAL MAMMARY ARTERY AND RIGHT SAPHENOUS VEIN GRAFTS;  Surgeon: [name], MD;  Location: [facility];  Service: Open Heart Surgery;  Laterality: N/A;  . EXCISION MORTON'S NEUROMA    . POLYPECTOMY  06/27/2019   Procedure: POLYPECTOMY;  Surgeon: Rogene Houston, MD;  [facility];  Service: Endoscopy;;  proximal sigmoid colon  . RIGHT/LEFT  HEART CATH AND CORONARY ANGIOGRAPHY N/A 03/12/2019   Procedure: RIGHT/LEFT HEART CATH AND CORONARY ANGIOGRAPHY;  Surgeon: [name], MD;  Location: [facility];  Service: Cardiovascular;  Laterality: N/A;  . TEE WITHOUT CARDIOVERSION N/A 03/25/2019   Procedure: TRANSESOPHAGEAL ECHOCARDIOGRAM (TEE);  Surgeon: [name], MD;  Location: [facility];  Service: Open Heart Surgery;  Laterality: N/A;    Family History  Problem Relation Age of Onset  . Colon cancer Mother   . Heart Problems Father   . Diabetes Father   . Valvular heart disease Father     Social History   Occupational History  . Not on file  Tobacco Use  . Smoking status: Former Smoker    Quit date: 07/06/1988    Years since quitting: 31.7  . Smokeless tobacco: Never Used  Vaping Use  . Vaping Use: Never used  Substance and Sexual Activity  . Alcohol use: Not Currently    Comment: socially  . Drug use: No  . Sexual activity: Not on file     Physical Exam: Blood pressure 120/90, pulse 94, temperature (!) 97.3 F (36.3 C), temperature source Temporal, height 5\' 10"  (1.778 m), weight 221 lb (100.2 kg), SpO2 97 %.  Gen:      No acute distress Lungs:    No increased respiratory effort, symmetric chest wall excursion, clear to auscultation bilaterally, no wheezes or crackles, well-healed midline sternal incision. CV:  Regular rate and rhythm; no murmurs, rubs, or gallops.  No pedal edema   Data Reviewed: Imaging: I have personally reviewed the chest x-ray which shows sternotomy wires and lingular opacity which is persistent on multiple chest x-rays since surgery.  PFTs:  PFT Results Latest Ref Rng & Units 03/27/2020  FVC-Pre L 4.07  FVC-Predicted Pre % 87  FVC-Post L 4.14  FVC-Predicted Post % 88  Pre FEV1/FVC % % 86  Post FEV1/FCV % % 88  FEV1-Pre L 3.48  FEV1-Predicted Pre % 99  FEV1-Post L 3.65  DLCO uncorrected ml/min/mmHg 15.65  DLCO UNC% % 57  DLCO corrected ml/min/mmHg  15.61  DLCO COR %Predicted % 57  DLVA Predicted % 57  TLC L 6.55  TLC % Predicted % 93  RV % Predicted % 106   I have personally reviewed the patient's PFTs and no airflow limitation, normal spirometry and lung volumes.  Mildly reduced diffusion capacity.  Labs:  Immunization status: Immunization History  Administered Date(s) Administered  . Janssen (J&J) SARS-COV-2 Vaccination 09/21/2019  . Pneumococcal Polysaccharide-23 07/16/2018  . Zoster Recombinat (Shingrix) 04/25/2019    Assessment:  Shortness of breath CAD s/p CABG Central and Obstructive OSA with an RDI of 20.9 (supine AHI 58.3, non-supine AHI 19.)  Plan/Recommendations: Start using ASV nocturnally for treatment of sleep apnea.  Resume cardiac rehab. PFTs today demonstrate no pulmonary explanation for why he has dyspnea. Am happy to see him back in 6 months after starting rehab and treating his sleep apnea.  Return to Care: As needed.  Lenice Llamas, MD Pulmonary and [employer]

## 2020-03-30 ENCOUNTER — Telehealth: Payer: Self-pay | Admitting: Neurology

## 2020-03-30 NOTE — Telephone Encounter (Signed)
Pt called, CPAP distributor will not be able get me a CPAP machine until November or December. Distributor will not be able to get one any sooner. Can you find another distributor? Would like a call from the nurse.

## 2020-03-30 NOTE — Telephone Encounter (Signed)
Unfortunately we do not have an ASV machine. If this was sent to [facility] you can message Jeneen Rinks and see if an ASV machine can be set up sooner.

## 2020-03-30 NOTE — Telephone Encounter (Signed)
I will check with our sleep lab and see if we could provide a used machine interim until the pt is able to start his machine.  Unfortunately due to the CPAP shortage most suppliers have this start date.

## 2020-03-30 NOTE — Telephone Encounter (Signed)
I have reached out to [place] about his. [facility] was the original DME contacted. Hopefully he can assist with moving the pt up.

## 2020-03-31 NOTE — Telephone Encounter (Signed)
03/30/2020 Late Entry-  [facility] reached back out to me and stated they could not accommodate a sooner start date.   I called the pt and advised we would try and send his order over to [facility]. I advised we have been told cpap starts should be within 1-2 weeks. I told the pt if he has not heard in 7 business days about this start to call me back. I have sent the order to [facility] and received confirmation.  Pt advised to keep [place] updated and was agreeable to doing so.

## 2020-04-09 NOTE — Telephone Encounter (Signed)
Spoke to pt, He states [facility] has reached out to him and they are waiting on insurance to approve cpap. He wanted to know if we could expedite the process. Informed pt that I will reached out to [facility] and have them call pt Pt voiced understanding.

## 2020-04-09 NOTE — Telephone Encounter (Signed)
Pt called stating that his insurance is not covering his cpap machine and he is needing to discuss with RN. Please advise.

## 2020-04-14 DIAGNOSIS — M25512 Pain in left shoulder: Secondary | ICD-10-CM | POA: Diagnosis not present

## 2020-04-14 DIAGNOSIS — M25511 Pain in right shoulder: Secondary | ICD-10-CM | POA: Diagnosis not present

## 2020-04-21 ENCOUNTER — Ambulatory Visit: Payer: Medicaid Other

## 2020-04-21 DIAGNOSIS — M25512 Pain in left shoulder: Secondary | ICD-10-CM | POA: Diagnosis not present

## 2020-04-21 DIAGNOSIS — S46091D Other injury of muscle(s) and tendon(s) of the rotator cuff of right shoulder, subsequent encounter: Secondary | ICD-10-CM | POA: Diagnosis not present

## 2020-04-23 ENCOUNTER — Telehealth: Payer: Self-pay | Admitting: Family Medicine

## 2020-04-23 NOTE — Telephone Encounter (Signed)
Nurses This patient had a form sent over from Dr. Tonita Cong with [facility] requesting surgical clearance for upcoming shoulder surgery  Please notify patient that he will need to do a follow-up visit with me regarding this issue  We can see the patient to do medical approval for this this will need to be with me before Thanksgiving. But please also call Dr. Bernadette Hoit office and let them know that they should also request cardiac clearance from his cardiologist with [facility]

## 2020-04-24 NOTE — Telephone Encounter (Signed)
Patient scheduled with Dr Nicki Reaper 05/01/20 at 9:30am  Left Message with Dr Reather Littler surgical scheduler to advised patient's need for cardiac clearance from his cardiologist

## 2020-04-24 NOTE — Telephone Encounter (Signed)
[facility] returned call. Advised that pt would need cardiac clearance from cardiology. [facility] will fax over form to cardiologist.

## 2020-04-27 ENCOUNTER — Telehealth: Payer: Self-pay

## 2020-04-27 ENCOUNTER — Ambulatory Visit: Payer: Medicaid Other | Admitting: "Endocrinology

## 2020-04-27 NOTE — Telephone Encounter (Signed)
Left voice mail to call back 

## 2020-04-27 NOTE — Telephone Encounter (Signed)
   Primary Cardiologist: No primary care provider on file.  Chart reviewed as part of pre-operative protocol coverage. Getting > 4 mets of activity. Given past medical history and time since last visit, based on ACC/AHA guidelines, [name] would be at acceptable risk for the planned procedure without further cardiovascular testing.   The patient was advised that if he develops new symptoms prior to surgery to contact our office to arrange for a follow-up visit, and he verbalized understanding.  I will route this recommendation to the requesting party via Epic fax function and remove from pre-op pool.  Please call with questions.  [place], [place] 04/27/2020, 1:50 PM

## 2020-04-27 NOTE — Telephone Encounter (Signed)
Patient returning call.

## 2020-04-27 NOTE — Telephone Encounter (Signed)
   [facility] Pre-operative Risk Assessment    Request for surgical clearance:  1. What type of surgery is being performed? ROTATOR CUFF REPAIR-RIGHT SHOULDER   2. When is this surgery scheduled? TBD   3. What type of clearance is required (medical clearance vs. Pharmacy clearance to hold med vs. Both)? MEDICAL  4. Are there any medications that need to be held prior to surgery and how long? NONE LISTED   5. Practice name and name of physician performing surgery? [place] [facility]  ATTN:SHERRY   6. What is the office phone number? [phone]   7.   What is the office fax number? [phone]  8.   Anesthesia type (None, local, MAC, general) ? NOT LISTED-LM2CB

## 2020-04-28 ENCOUNTER — Emergency Department (HOSPITAL_COMMUNITY)
Admission: EM | Admit: 2020-04-28 | Discharge: 2020-04-28 | Disposition: A | Discharge status: Home / Self Care | Payer: Medicare Other | Attending: Emergency Medicine | Admitting: Emergency Medicine

## 2020-04-28 ENCOUNTER — Encounter (HOSPITAL_COMMUNITY): Payer: Self-pay

## 2020-04-28 ENCOUNTER — Emergency Department (HOSPITAL_COMMUNITY): Payer: Medicare Other

## 2020-04-28 ENCOUNTER — Other Ambulatory Visit: Payer: Self-pay

## 2020-04-28 DIAGNOSIS — E113393 Type 2 diabetes mellitus with moderate nonproliferative diabetic retinopathy without macular edema, bilateral: Secondary | ICD-10-CM | POA: Diagnosis not present

## 2020-04-28 DIAGNOSIS — R55 Syncope and collapse: Secondary | ICD-10-CM | POA: Diagnosis not present

## 2020-04-28 DIAGNOSIS — N183 Chronic kidney disease, stage 3 unspecified: Secondary | ICD-10-CM | POA: Insufficient documentation

## 2020-04-28 DIAGNOSIS — Z7982 Long term (current) use of aspirin: Secondary | ICD-10-CM | POA: Insufficient documentation

## 2020-04-28 DIAGNOSIS — Z87891 Personal history of nicotine dependence: Secondary | ICD-10-CM | POA: Insufficient documentation

## 2020-04-28 DIAGNOSIS — I2510 Atherosclerotic heart disease of native coronary artery without angina pectoris: Secondary | ICD-10-CM | POA: Insufficient documentation

## 2020-04-28 DIAGNOSIS — Z794 Long term (current) use of insulin: Secondary | ICD-10-CM | POA: Insufficient documentation

## 2020-04-28 DIAGNOSIS — I130 Hypertensive heart and chronic kidney disease with heart failure and stage 1 through stage 4 chronic kidney disease, or unspecified chronic kidney disease: Secondary | ICD-10-CM | POA: Insufficient documentation

## 2020-04-28 DIAGNOSIS — R1110 Vomiting, unspecified: Secondary | ICD-10-CM | POA: Diagnosis not present

## 2020-04-28 DIAGNOSIS — Z7984 Long term (current) use of oral hypoglycemic drugs: Secondary | ICD-10-CM | POA: Insufficient documentation

## 2020-04-28 DIAGNOSIS — E1159 Type 2 diabetes mellitus with other circulatory complications: Secondary | ICD-10-CM | POA: Diagnosis not present

## 2020-04-28 DIAGNOSIS — I5041 Acute combined systolic (congestive) and diastolic (congestive) heart failure: Secondary | ICD-10-CM | POA: Diagnosis not present

## 2020-04-28 DIAGNOSIS — E1122 Type 2 diabetes mellitus with diabetic chronic kidney disease: Secondary | ICD-10-CM | POA: Insufficient documentation

## 2020-04-28 DIAGNOSIS — Z7901 Long term (current) use of anticoagulants: Secondary | ICD-10-CM | POA: Diagnosis not present

## 2020-04-28 DIAGNOSIS — E1121 Type 2 diabetes mellitus with diabetic nephropathy: Secondary | ICD-10-CM | POA: Insufficient documentation

## 2020-04-28 DIAGNOSIS — R42 Dizziness and giddiness: Secondary | ICD-10-CM | POA: Diagnosis not present

## 2020-04-28 DIAGNOSIS — I160 Hypertensive urgency: Secondary | ICD-10-CM

## 2020-04-28 LAB — CBC WITH DIFFERENTIAL/PLATELET
Abs Immature Granulocytes: 0.09 10*3/uL — ABNORMAL HIGH (ref 0.00–0.07)
Basophils Absolute: 0.1 10*3/uL (ref 0.0–0.1)
Basophils Relative: 1 %
Eosinophils Absolute: 0.1 10*3/uL (ref 0.0–0.5)
Eosinophils Relative: 1 %
HCT: 37.7 % — ABNORMAL LOW (ref 39.0–52.0)
Hemoglobin: 12.3 g/dL — ABNORMAL LOW (ref 13.0–17.0)
Immature Granulocytes: 1 %
Lymphocytes Relative: 8 %
Lymphs Abs: 0.9 10*3/uL (ref 0.7–4.0)
MCH: 28.1 pg (ref 26.0–34.0)
MCHC: 32.6 g/dL (ref 30.0–36.0)
MCV: 86.1 fL (ref 80.0–100.0)
Monocytes Absolute: 0.6 10*3/uL (ref 0.1–1.0)
Monocytes Relative: 5 %
Neutro Abs: 10.5 10*3/uL — ABNORMAL HIGH (ref 1.7–7.7)
Neutrophils Relative %: 84 %
Platelets: 225 10*3/uL (ref 150–400)
RBC: 4.38 MIL/uL (ref 4.22–5.81)
RDW: 13.3 % (ref 11.5–15.5)
WBC: 12.3 10*3/uL — ABNORMAL HIGH (ref 4.0–10.5)
nRBC: 0 % (ref 0.0–0.2)

## 2020-04-28 LAB — BASIC METABOLIC PANEL
Anion gap: 11 (ref 5–15)
BUN: 45 mg/dL — ABNORMAL HIGH (ref 8–23)
CO2: 20 mmol/L — ABNORMAL LOW (ref 22–32)
Calcium: 9.6 mg/dL (ref 8.9–10.3)
Chloride: 106 mmol/L (ref 98–111)
Creatinine, Ser: 2.72 mg/dL — ABNORMAL HIGH (ref 0.61–1.24)
GFR, Estimated: 25 mL/min — ABNORMAL LOW (ref >60)
Glucose, Bld: 229 mg/dL — ABNORMAL HIGH (ref 70–99)
Potassium: 4.1 mmol/L (ref 3.5–5.1)
Sodium: 137 mmol/L (ref 135–145)

## 2020-04-28 LAB — CBG MONITORING, ED: Glucose-Capillary: 225 mg/dL — ABNORMAL HIGH (ref 70–99)

## 2020-04-28 LAB — TROPONIN I (HIGH SENSITIVITY)
Troponin I (High Sensitivity): 19 ng/L — ABNORMAL HIGH (ref <18)
Troponin I (High Sensitivity): 25 ng/L — ABNORMAL HIGH (ref <18)

## 2020-04-28 MED ORDER — IRBESARTAN 75 MG PO TABS
75.0000 mg | ORAL_TABLET | Freq: Every day | ORAL | Status: DC
  Administered 2020-04-28: 75 mg via ORAL
  Filled 2020-04-28 (×2): qty 1

## 2020-04-28 NOTE — ED Notes (Signed)
PT stated that they could not stand and refused orthostatic vital signs.

## 2020-04-28 NOTE — ED Triage Notes (Signed)
[facility], fall at home.  C/o dizziness and fell.  BP arrival 232/140.  CBG 212.  4 mg Zofran given.  # 18 left forearm.  C/o n/v.  History of CABG, HTN DM

## 2020-04-28 NOTE — ED Provider Notes (Incomplete)
Shared service with APP.  I have personally seen and examined the patient, providing direct face to face care.  Physical exam findings and plan include  Patient presents after dizziness episode and elevated blood pressure.  Patient blood pressure initially 962 systolic.  Patient's blood pressure improved with time and oral blood pressure meds to 952 systolic. Patient having no stroke symptoms currently, no chest pain or shortness of breath.  Mild worsening kidney function likely blood pressure related.  Discussed importance of repeat blood work and close follow-up with medication adjustment with primary care doctor. Patient is ready to be discharged and he understands he can return for any reason.  No diagnosis found.

## 2020-04-28 NOTE — ED Provider Notes (Signed)
[facility] EMERGENCY DEPARTMENT Provider Note   CSN: [id number] Arrival date & time: 04/28/20  1230     History Chief Complaint  Patient presents with  . Fall    [name] is a 64 y.o. male.  HPI      [name] is a 64 y.o. male with history of CAD, HTN and type II DM, had CABG 02/2019 who presents to the Emergency Department complaining of a single episode of near syncope that occurred shortly before ER arrival.  He states that he woke up feeling okay this morning was changing his close and started to wash his face when he suddenly felt as though the room was spinning and he became diaphoretic and nauseous.  He sat down on the bed then reports sliding onto the floor.  He also experienced one episode of bilious vomiting during this time.  His significant other contacted EMS.  He states these symptoms are new for him. Denies injury.  Upon arrival, he reports feeling better but continues to have some mild dizziness.  He denies chest pain, shortness of breath, headache, facial or extremity numbness or weakness.  No new medication changes. Episode occurred prior to taking his morning medications.   PCP: Dr [name] Cardiologist: Dr. Skeet Latch  Past Medical History:  Diagnosis Date  . CAD (coronary artery disease) 03/03/2019  . Hypertension   . Type 2 diabetes mellitus with diabetic nephropathy ([place]) 04/23/2019    Patient Active Problem List   Diagnosis Date Noted  . Myalgia due to statin 03/11/2020  . Arthritis of both acromioclavicular joints 08/21/2019  . Peripheral arterial disease ([place]) 05/31/2019  . Edema 05/27/2019  . CAD (coronary artery disease) 05/27/2019  . DM type 2 causing vascular disease ([place]) 04/23/2019  . S/P CABG (coronary artery bypass graft) 03/25/2019  . Moderate nonproliferative diabetic retinopathy of both eyes associated with type 2 diabetes mellitus ([place]) 03/18/2019  . Special screening for malignant neoplasms, colon 02/04/2019    . Family hx of colon cancer 02/04/2019  . Cardiomyopathy ([place]) 06/08/2018  . CRI (chronic renal insufficiency), stage 3 (moderate) ([place]) 06/08/2018  . Acute combined systolic (congestive) and diastolic (congestive) heart failure ([place]) 04/26/2018  . Essential hypertension, benign 04/26/2018  . Diabetic nephropathy ([place]) 04/26/2018  . Hypertensive urgency 04/26/2018  . Mixed hyperlipidemia 04/26/2018  . Elevated troponin 04/26/2018    Past Surgical History:  Procedure Laterality Date  . BIOPSY  06/27/2019   Procedure: BIOPSY;  Surgeon: Rogene Houston, MD;  [facility];  Service: Endoscopy;;  ascending colon polyp  . CARDIAC SURGERY    . COLONOSCOPY N/A 06/27/2019   Procedure: COLONOSCOPY;  Surgeon: Rogene Houston, MD;  [facility];  Service: Endoscopy;  Laterality: N/A;  1030am-office rescheduled to 06/27/19 @9 :55am  . CORONARY ARTERY BYPASS GRAFT N/A 03/25/2019   Procedure: CORONARY ARTERY BYPASS GRAFTING (CABG) X  4 USING LEFT INTERNAL MAMMARY ARTERY AND RIGHT SAPHENOUS VEIN GRAFTS;  Surgeon: [name], MD;  Location: [place];  Service: Open Heart Surgery;  Laterality: N/A;  . EXCISION MORTON'S NEUROMA    . POLYPECTOMY  06/27/2019   Procedure: POLYPECTOMY;  Surgeon: Rogene Houston, MD;  Location[facility];  Service: Endoscopy;;  proximal sigmoid colon  . RIGHT/LEFT HEART CATH AND CORONARY ANGIOGRAPHY N/A 03/12/2019   Procedure: RIGHT/LEFT HEART CATH AND CORONARY ANGIOGRAPHY;  Surgeon: [name], MD;  Location: [place] [facility];  Service: Cardiovascular;  Laterality: N/A;  .  TEE WITHOUT CARDIOVERSION N/A 03/25/2019   Procedure: TRANSESOPHAGEAL ECHOCARDIOGRAM (TEE);  Surgeon: [name], MD;  Location: [place];  Service: Open Heart Surgery;  Laterality: N/A;       Family History  Problem Relation Age of Onset  . Colon cancer Mother   . Heart Problems Father   . Diabetes Father   . Valvular heart disease Father      Social History   Tobacco Use  . Smoking status: Former Smoker    Quit date: 07/06/1988    Years since quitting: 31.8  . Smokeless tobacco: Never Used  Vaping Use  . Vaping Use: Never used  Substance Use Topics  . Alcohol use: Not Currently    Comment: socially  . Drug use: No    Home Medications Prior to Admission medications   Medication Sig Start Date End Date Taking? Authorizing Provider  acetaminophen (TYLENOL) 500 MG tablet Take 1,000 mg by mouth every 6 (six) hours as needed for moderate pain or headache.     [provider]  albuterol (VENTOLIN HFA) 108 (90 Base) MCG/ACT inhaler Inhale 2 puffs into the lungs every 6 (six) hours as needed. 02/26/20   Spero Geralds, MD  aspirin EC 81 MG tablet Take 81 mg by mouth daily.    [provider]  Boswellia-Glucosamine-Vit D (OSTEO BI-FLEX ONE PER DAY PO) Take 1 tablet by mouth daily.     [provider]  Cholecalciferol (VITAMIN D-3) 125 MCG (5000 UT) TABS Take 5,000 Units by mouth daily.    [provider]  Coenzyme Q10 (COQ10 PO) Take 1 tablet by mouth daily.    [provider]  Cyanocobalamin (B-12) 2500 MCG TABS Take 2,500 mcg by mouth daily.    [provider]  doxazosin (CARDURA) 4 MG tablet Take 1 tablet (4 mg total) by mouth at bedtime. 01/20/20   Skeet Latch, MD  furosemide (LASIX) 40 MG tablet TAKE 1 AND 1/2 TABLETS BY MOUTH DAILY Patient taking differently: TAKE 1 TABLET BY MOUTH DAILY 02/05/20   Skeet Latch, MD  glipiZIDE (GLUCOTROL) 5 MG tablet Take 0.5 tablets (2.5 mg total) by mouth daily before breakfast. 12/10/19   Renato Shin, MD  HYDROcodone-acetaminophen (NORCO/VICODIN) 5-325 MG tablet Take 1 tablet by mouth every 6 (six) hours as needed for moderate pain. 03/10/20   [name], MD  insulin glargine (LANTUS SOLOSTAR) 100 UNIT/ML Solostar Pen Inject 25 Units into the skin every morning. 12/10/19   Renato Shin, MD  Insulin Pen Needle (B-D  ULTRAFINE III SHORT PEN) 31G X 8 MM MISC 1 each by Does not apply route as directed. 05/30/19   Cassandria Anger, MD  Magnesium 100 MG CAPS Take 1 capsule by mouth daily.    [provider]  Multiple Vitamin (MULTIVITAMIN WITH MINERALS) TABS tablet Take 1 tablet by mouth daily.    [provider]  Multiple Vitamins-Minerals (ZINC PO) Take 1 tablet by mouth daily.    [provider]  sertraline (ZOLOFT) 100 MG tablet Take 1 tablet (100 mg total) by mouth daily. 03/10/20   [name], MD  valsartan (DIOVAN) 80 MG tablet Take 1 tablet (80 mg total) by mouth daily. 02/20/20   Skeet Latch, MD    Allergies    Bee venom and Atorvastatin  Review of Systems   Review of Systems  Constitutional: Positive for diaphoresis. Negative for appetite change, chills and fever.  HENT: Negative for sore throat and trouble swallowing.   Eyes:  Negative for visual disturbance.  Respiratory: Negative for cough, chest tightness and shortness of breath.   Cardiovascular: Negative for chest pain and leg swelling.  Gastrointestinal: Positive for vomiting. Negative for abdominal distention, abdominal pain and diarrhea.  Genitourinary: Negative for difficulty urinating and dysuria.  Musculoskeletal: Negative for arthralgias and back pain.  Neurological: Positive for dizziness and syncope (near syncope). Negative for seizures, speech difficulty, weakness, numbness and headaches.  Psychiatric/Behavioral: Negative for confusion.    Physical Exam Updated Vital Signs BP (!) 193/104   Pulse 81   Temp (!) 97.5 F (36.4 C) (Oral)   Resp (!) 9   Ht 5\' 10"  (1.778 m)   Wt 99.8 kg   SpO2 95%   BMI 31.57 kg/m   Physical Exam Vitals and nursing note reviewed.  Constitutional:      General: He is not in acute distress.    Appearance: Normal appearance.  HENT:     Head: Atraumatic.     Mouth/Throat:     Mouth: Mucous membranes are moist.  Eyes:     Extraocular Movements:  Extraocular movements intact.     Conjunctiva/sclera: Conjunctivae normal.     Pupils: Pupils are equal, round, and reactive to light.  Cardiovascular:     Rate and Rhythm: Normal rate and regular rhythm.     Pulses: Normal pulses.  Pulmonary:     Effort: Pulmonary effort is normal. No respiratory distress.     Breath sounds: Normal breath sounds.  Chest:     Chest wall: No tenderness.  Abdominal:     General: There is no distension.     Palpations: Abdomen is soft.     Tenderness: There is no abdominal tenderness.  Musculoskeletal:        General: Normal range of motion.     Cervical back: Normal range of motion.     Right lower leg: No edema.     Left lower leg: No edema.  Lymphadenopathy:     Cervical: No cervical adenopathy.  Skin:    General: Skin is warm.     Capillary Refill: Capillary refill takes less than 2 seconds.     Findings: No rash.  Neurological:     General: No focal deficit present.     Mental Status: He is alert.     GCS: GCS eye subscore is 4. GCS verbal subscore is 5. GCS motor subscore is 6.     Sensory: Sensation is intact. No sensory deficit.     Motor: Motor function is intact. No weakness or pronator drift.     Coordination: Coordination is intact.     Comments: CN II-XII intact.  Speech clear.  No pronator drift.  Mentating well.  Nml finger nose testing     ED Results / Procedures / Treatments   Labs (all labs ordered are listed, but only abnormal results are displayed) Labs Reviewed  BASIC METABOLIC PANEL - Abnormal; Notable for the following components:      Result Value   CO2 20 (*)    Glucose, Bld 229 (*)    BUN 45 (*)    Creatinine, Ser 2.72 (*)    GFR, Estimated 25 (*)    All other components within normal limits  CBC WITH DIFFERENTIAL/PLATELET - Abnormal; Notable for the following components:   WBC 12.3 (*)    Hemoglobin 12.3 (*)    HCT 37.7 (*)    Neutro Abs 10.5 (*)    Abs Immature Granulocytes 0.09 (*)  All other  components within normal limits  CBG MONITORING, ED - Abnormal; Notable for the following components:   Glucose-Capillary 225 (*)    All other components within normal limits  TROPONIN I (HIGH SENSITIVITY) - Abnormal; Notable for the following components:   Troponin I (High Sensitivity) 19 (*)    All other components within normal limits  CBG MONITORING, ED    EKG EKG Interpretation  Date/Time:  Tuesday April 28 2020 14:05:35 EST Ventricular Rate:  79 PR Interval:    QRS Duration: 128 QT Interval:  424 QTC Calculation: 487 R Axis:   73 Text Interpretation: Sinus rhythm Nonspecific intraventricular conduction delay Inferior infarct, old Lateral leads are also involved Abnormal ECG Confirmed by Carmin Muskrat [phone]) on 04/30/2020 10:20:58 PM   Radiology CT Head Wo Contrast  Result Date: 04/28/2020 CLINICAL DATA:  Dizziness with fall EXAM: CT HEAD WITHOUT CONTRAST TECHNIQUE: Contiguous axial images were obtained from the base of the skull through the vertex without intravenous contrast. COMPARISON:  None. FINDINGS: Brain: There is mild to moderate diffuse atrophy. There is no intracranial mass, hemorrhage, extra-axial fluid collection, or midline shift. There is patchy small vessel disease in the centra semiovale bilaterally. No acute infarct evident. Vascular: No hyperdense vessel. There are foci of carotid artery calcification bilaterally. Skull: Bony calvarium appears intact. Sinuses/Orbits: There is mucosal thickening in several ethmoid air cells. Other visualized paranasal sinuses are clear. Visualized orbits appear symmetric bilaterally. Other: Mastoid air cells are clear. IMPRESSION: Atrophy with periventricular small vessel disease. No acute infarct. No mass, hemorrhage, or extra-axial fluid collection. There are foci of arterial vascular calcification. There is mucosal thickening in several ethmoid air cells. Electronically Signed   By: Lowella Grip III M.D.   On:  04/28/2020 14:58   DG Chest Port 1 View  Result Date: 04/28/2020 CLINICAL DATA:  64 year old male with dizziness EXAM: PORTABLE CHEST 1 VIEW COMPARISON:  12/31/2019 FINDINGS: Cardiomediastinal silhouette likely unchanged, with low lung volumes accentuated by the apical lordotic positioning. Surgical changes of median sternotomy and CABG. Coarsened interstitial markings of the lungs similar to the prior. No pneumothorax pleural effusion or confluent airspace disease. IMPRESSION: Low lung volumes without evidence of acute cardiopulmonary disease. Electronically Signed   By: Corrie Mckusick D.O.   On: 04/28/2020 14:44    Procedures Procedures (including critical care time)  Medications Ordered in ED Medications - No data to display  ED Course  I have reviewed the triage vital signs and the nursing notes.  Pertinent labs & imaging results that were available during my care of the patient were reviewed by me and considered in my medical decision making (see chart for details).    MDM Rules/Calculators/A&P                          Patient here for evaluation after a near syncopal episode that occurred this morning.  Describes having vertiginous symptoms.  No headache, no focal neuro deficits on exam.  Reports feeling better prior to ER arrival.  No reported chest pain or dyspnea.  Today's EKG appears unchanged from July 2021.  Low clinical suspecion for acute cardiac process.  Reviewed by Dr. Roderic Palau  On recheck, pt reported symptoms have improved and he is no longer dizzy.  Hypertensive here but improved from arrival.  Given an ARB.  Pressure now 960'A systolic.  CXR and CT head reassuring.  Labs show worsening of his baseline kidney function.  Creatinine today 2.72  up from 2.23 in September.    Pt also seen by Dr. Reather Converse.  Pt reports prior sx's resolved.  He was offered admission, but prefers to go home at this time.  Agrees to close f/u regarding his worsening kidney function. Return  precautions given.    The patient appears reasonably screened and/or stabilized for discharge and I doubt any other medical condition or other [facility] requiring further screening, evaluation, or treatment in the ED at this time prior to discharge.    Final Clinical Impression(s) / ED Diagnoses Final diagnoses:  Hypertensive urgency  Near syncope    Rx / DC Orders ED Discharge Orders    None       Kem Parkinson, PA-C 05/01/20 1404    Elnora Morrison, MD 05/02/20 1517

## 2020-04-28 NOTE — Discharge Instructions (Addendum)
The CT of your head and chest XR today were reassuring.  Your kidney function tests today are elevated from baseline.  It is important that you have close follow-up with your nephrologist or primary provider.  Your blood pressure today is elevated.  Please take your blood pressure medication every day as directed.  Return to ER if you develop any worsening symptoms

## 2020-05-01 ENCOUNTER — Encounter: Payer: Self-pay | Admitting: Family Medicine

## 2020-05-01 ENCOUNTER — Other Ambulatory Visit: Payer: Self-pay

## 2020-05-01 ENCOUNTER — Ambulatory Visit: Payer: Medicaid Other | Admitting: Family Medicine

## 2020-05-01 VITALS — BP 134/86 | HR 64 | Ht 70.0 in | Wt 213.0 lb

## 2020-05-01 DIAGNOSIS — M25511 Pain in right shoulder: Secondary | ICD-10-CM | POA: Diagnosis not present

## 2020-05-01 DIAGNOSIS — E1159 Type 2 diabetes mellitus with other circulatory complications: Secondary | ICD-10-CM | POA: Diagnosis not present

## 2020-05-01 DIAGNOSIS — M75111 Incomplete rotator cuff tear or rupture of right shoulder, not specified as traumatic: Secondary | ICD-10-CM

## 2020-05-01 DIAGNOSIS — I10 Essential (primary) hypertension: Secondary | ICD-10-CM | POA: Diagnosis not present

## 2020-05-01 DIAGNOSIS — N183 Chronic kidney disease, stage 3 unspecified: Secondary | ICD-10-CM | POA: Diagnosis not present

## 2020-05-01 MED ORDER — HYDROCODONE-ACETAMINOPHEN 5-325 MG PO TABS: 1.0000 | ORAL_TABLET | Freq: Four times a day (QID) | ORAL | 0 refills | Status: DC | PRN

## 2020-05-01 NOTE — Progress Notes (Addendum)
Subjective:    Patient ID: [name], male    DOB: March 18, 1956, 64 y.o.   MRN: [id number]  HPIpt needs form filled out for surgical clearance for rotator cuff repair shoulder Patient does have underlying heart disease as well as diabetes. Was at the ED on 04/28/20.   Would like to discuss ozempic injection diabetic specialist put him on.  Patient was concerned that this could be causing some side effects.  Essential hypertension, benign  DM type 2 causing vascular disease (HCC)  CRI (chronic renal insufficiency), stage 3 (moderate) (HCC)  Acute pain of right shoulder - Plan: HYDROcodone-acetaminophen (NORCO/VICODIN) 5-325 MG tablet  Nontraumatic incomplete tear of right rotator cuff Patient does request pain medicine he uses it sparingly Currently does not have severe pain but it does come and go He does have underlying chronic kidney disease he has been advised stay away from anti-inflammatories continue his medicine His sugars overall have been under fairly decent control  Review of Systems  Constitutional: Negative for activity change.  HENT: Negative for congestion and rhinorrhea.   Respiratory: Negative for cough and shortness of breath.   Cardiovascular: Negative for chest pain.  Gastrointestinal: Negative for abdominal pain, diarrhea, nausea and vomiting.  Genitourinary: Negative for dysuria and hematuria.  Neurological: Negative for weakness and headaches.  Psychiatric/Behavioral: Negative for behavioral problems and confusion.       Objective:   Physical Exam Vitals reviewed.  Constitutional:      General: He is not in acute distress. HENT:     Head: Normocephalic and atraumatic.  Eyes:     General:        Right eye: No discharge.        Left eye: No discharge.  Neck:     Trachea: No tracheal deviation.  Cardiovascular:     Rate and Rhythm: Normal rate and regular rhythm.     Heart sounds: Normal heart sounds. No murmur heard.   Pulmonary:      Effort: Pulmonary effort is normal. No respiratory distress.     Breath sounds: Normal breath sounds.  Lymphadenopathy:     Cervical: No cervical adenopathy.  Skin:    General: Skin is warm and dry.  Neurological:     Mental Status: He is alert.     Coordination: Coordination normal.  Psychiatric:        Behavior: Behavior normal.       Office Visit from 08/21/2019 in [facility]  PHQ-9 Total Score 15           Assessment & Plan:  1. Essential hypertension, benign Blood pressure several rechecks under better control watch salt in diet continue current medications kidney doctor added back amlodipine 5 mg still taking valsartan and also Cardura  2. DM type 2 causing vascular disease ([place]) Patient states his numbers are doing better he follows up with endocrinology on a regular basis  3. CRI (chronic renal insufficiency), stage 3 (moderate) (HCC) Follows up with renal doctors every 3 months will be seeing them this coming week they monitor his blood work  4. Acute pain of right shoulder Patient requesting hydrocodone for infrequent use Has upcoming surgery It is felt that the patient can have his surgery as long as cardiology clears him as well Patient understands the importance of following through with physical therapy with shoulder surgery From a blood pressure kidney and diabetic aspect he is doing well enough to do surgery currently - HYDROcodone-acetaminophen (NORCO/VICODIN) 5-325 MG tablet; Take  1 tablet by mouth every 6 (six) hours as needed for moderate pain.  Dispense: 20 tablet; Refill: 0  5. Nontraumatic incomplete tear of right rotator cuff See above Patient had recent dizziness nausea vomiting and hypertensive urgency went to the ER had numerous tests doing better now it is hard to know if this was triggered by blood pressure going up or possibly a neurologic in her ear aspect I find no evidence of a stroke  Mild depression patient will work hard  on taking his sertraline on a regular basis patient will follow up in 3 months.  Not suicidal.  Just sometimes feels like his quality of life is no good.

## 2020-05-04 DIAGNOSIS — R5383 Other fatigue: Secondary | ICD-10-CM | POA: Diagnosis not present

## 2020-05-04 DIAGNOSIS — I129 Hypertensive chronic kidney disease with stage 1 through stage 4 chronic kidney disease, or unspecified chronic kidney disease: Secondary | ICD-10-CM | POA: Diagnosis not present

## 2020-05-04 DIAGNOSIS — E1122 Type 2 diabetes mellitus with diabetic chronic kidney disease: Secondary | ICD-10-CM | POA: Diagnosis not present

## 2020-05-04 DIAGNOSIS — N1832 Chronic kidney disease, stage 3b: Secondary | ICD-10-CM | POA: Diagnosis not present

## 2020-05-05 ENCOUNTER — Ambulatory Visit (INDEPENDENT_AMBULATORY_CARE_PROVIDER_SITE_OTHER): Payer: Medicaid Other | Admitting: Pharmacist Clinician (PhC)/ Clinical Pharmacy Specialist

## 2020-05-05 ENCOUNTER — Other Ambulatory Visit: Payer: Self-pay

## 2020-05-05 VITALS — BP 134/82 | HR 93 | Resp 16 | Ht 70.0 in | Wt 218.0 lb

## 2020-05-05 DIAGNOSIS — E782 Mixed hyperlipidemia: Secondary | ICD-10-CM | POA: Diagnosis not present

## 2020-05-05 DIAGNOSIS — G720 Drug-induced myopathy: Secondary | ICD-10-CM

## 2020-05-05 DIAGNOSIS — I10 Essential (primary) hypertension: Secondary | ICD-10-CM | POA: Diagnosis not present

## 2020-05-05 DIAGNOSIS — T466X5A Adverse effect of antihyperlipidemic and antiarteriosclerotic drugs, initial encounter: Secondary | ICD-10-CM

## 2020-05-05 NOTE — Assessment & Plan Note (Signed)
Patient doing much better with current regimen.  He should continue with this and continue regular home BP monitoring.  Did advise that he use caution with positional changes as it does increase dizziness.  There was a slight drop in blood pressure standing vs sitting, as well as after having bent over to tie his shoes, when in the office today.  Systolic dropped from 980 to 126.  Not overly significant, but patient did sway a little when standing.  We have now seen him 3 times in the [facility] clinic and will schedule him for follow up with [name] in 4-6 weeks.

## 2020-05-05 NOTE — Progress Notes (Signed)
HPI:  [name] is a 64 y.o. male patient of Dr. Oval Linsey , with a PMH below who presents today for follow up in the advanced hypertension clinic.   He was not on any BP medications, as he stated everything tried had made him feel fatigued (see list below).  He was also unable to tolerate statins due to myalgias.   His main concern has been exertional dyspnea, which has not improved.  Patient was recently diagnosed with obstructive sleep apnea, and insufficient treatment with CPAP. Current recommendation is to initiate ASV (adaptive servo ventilation) and he is waiting for device delivery. Valsartan 80mg  daily was initiated during last OV with HTN clinic and patient is very happy with results. Denies problems with current therapy.  Today he returns for follow up.  States home BP readings have been doing well, but doesn't have any readings with him today.  Did have one trip to ED about 2 weeks ago.  He had woke that morning with dizziness and suspected inner ear issues.  He was unable to stand and his pressure spiked to > 505 systolic.  Wife called 911.  He was treated for the vertigo and given amlodipine 5 mg daily to help with blood pressure.   Today he notes that he feels better overall, but still has occasional dizziness when bending over or standing up quickly.  Click here for Secondary HTN Screening Tool (Then Refresh Note) Secondary Cause Data Comments  Herbals/Drugs  Screened:  01/20/2020 Not taking         Renovascular HTN             Sleep Apnea  Screened:  01/20/2020 No History Never evaluated  Patient denies snoring, states sleeps easily w/o concer  Hyperthyroidism  Screened:  04/29/2018 No History Previously ruled out  TSH 2.11  Hypothyroidism   Screened:  04/29/2018 No History Previously ruled out   TSH 2.11  Hyperaldosteronism             Pheochromocytoma  Screened:  01/20/2020 No History    Testing not indicated  Cushing's Syndrome  Screened:  01/20/2020 No  History     Testing not indicated  Hyperparathyroidism             Coarctation of Aorta            Chemotherapeutic Agents        Compliance               Past Medical History: ASCVD S/p CABG x 4, PAD -   CHF Chronic systolic and diastolic 3/97 echo EF at 67-34% up from 19-37%, grade 1 diastolic dysfunction  hyperlipidemia 7/21:  TC 265, TG 234, HDL 44, LDL 177 - no meds  DM2 10/20: A1c 8.2 - glipizide 2.5 mg qd, Lantus qd  CKD3 7/21:  SCr 2.14, GFR 32 CrCl 50     Blood Pressure Goal:  130/80  Current Medications: amlodipine 5 mg doxazosin 4 mg qhs  furosemide 40mg  daily Valsartan 80mg  daily  Intolerances: lisinopril, hctz, beta blockers, hydralazine  Family Hx: mother had colon cancer, died at 14, no heart disease; father died at 16's, valve, CHF  Social Hx: former smoker (quit 25+ yrs ago, 2 ppd x 10-15 yrs); no alcohol  Diet: no processed food; more chicken, eggs; some vegetables; increased protein, no snacks  Exercise: limited by DOE, was given information on PREP  Home BP readings:  17 readings since 9/20, average 137/92, HR range 82-96bpm  Labs: 7/21:  Na 139, K 4.3, Glu 142, BUN 29, SCr 2.17, GFR 32  Wt Readings from Last 3 Encounters:  05/05/20 218 lb (98.9 kg)  05/01/20 213 lb (96.6 kg)  04/28/20 220 lb (99.8 kg)   BP Readings from Last 3 Encounters:  05/05/20 134/82  05/01/20 134/86  04/28/20 (!) 146/89   Pulse Readings from Last 3 Encounters:  05/05/20 93  05/01/20 64  04/28/20 96    Current Outpatient Medications  Medication Sig Dispense Refill  . acetaminophen (TYLENOL) 500 MG tablet Take 1,000 mg by mouth every 6 (six) hours as needed for moderate pain or headache.     . albuterol (VENTOLIN HFA) 108 (90 Base) MCG/ACT inhaler Inhale 2 puffs into the lungs every 6 (six) hours as needed. 18 g 5  . amLODipine (NORVASC) 5 MG tablet Take 5 mg by mouth daily.    Marland Kitchen aspirin EC 81 MG tablet Take 81 mg by mouth daily.    . Cholecalciferol  (VITAMIN D-3) 125 MCG (5000 UT) TABS Take 5,000 Units by mouth daily.    . Coenzyme Q10 (COQ10 PO) Take 1 tablet by mouth daily.    . Cyanocobalamin (B-12) 2500 MCG TABS Take 2,500 mcg by mouth daily.    Marland Kitchen doxazosin (CARDURA) 4 MG tablet Take 1 tablet (4 mg total) by mouth at bedtime. 90 tablet 1  . furosemide (LASIX) 40 MG tablet TAKE 1 AND 1/2 TABLETS BY MOUTH DAILY (Patient taking differently: TAKE 1 TABLET BY MOUTH DAILY) 135 tablet 1  . HYDROcodone-acetaminophen (NORCO/VICODIN) 5-325 MG tablet Take 1 tablet by mouth every 6 (six) hours as needed for moderate pain. 20 tablet 0  . insulin glargine (LANTUS SOLOSTAR) 100 UNIT/ML Solostar Pen Inject 25 Units into the skin every morning. 5 pen PRN  . Insulin Pen Needle (B-D ULTRAFINE III SHORT PEN) 31G X 8 MM MISC 1 each by Does not apply route as directed. 100 each 3  . Multiple Vitamin (MULTIVITAMIN WITH MINERALS) TABS tablet Take 1 tablet by mouth daily.    . sertraline (ZOLOFT) 100 MG tablet Take 1 tablet (100 mg total) by mouth daily. 90 tablet 1  . valsartan (DIOVAN) 80 MG tablet Take 1 tablet (80 mg total) by mouth daily. 30 tablet 6  . Magnesium 100 MG CAPS Take 1 capsule by mouth daily. (Patient not taking: Reported on 05/05/2020)    . Semaglutide (OZEMPIC, 0.25 OR 0.5 MG/DOSE, [place]) Inject into the skin. Once a week injection (Patient not taking: Reported on 05/05/2020)     No current facility-administered medications for this visit.    Allergies  Allergen Reactions  . Bee Venom Anaphylaxis  . Atorvastatin     myalgia  . Rosuvastatin     myalgias    Past Medical History:  Diagnosis Date  . CAD (coronary artery disease) 03/03/2019  . Hypertension   . Type 2 diabetes mellitus with diabetic nephropathy ([place]) 04/23/2019    Blood pressure 134/82, pulse 93, resp. rate 16, height 5\' 10"  (1.778 m), weight 218 lb (98.9 kg), SpO2 93 %.  Essential hypertension, benign Patient doing much better with current regimen.  He should continue  with this and continue regular home BP monitoring.  Did advise that he use caution with positional changes as it does increase dizziness.  There was a slight drop in blood pressure standing vs sitting, as well as after having bent over to tie his shoes, when in the office today.  Systolic dropped from 144 to 126.  Not overly significant, but patient did sway a little when standing.  We have now seen him 3 times in the CVRR clinic and will schedule him for follow up with [name] in 4-6 weeks.    Mixed hyperlipidemia Patient with hyperlipidemia, LDL at 177 with no meds.  Has been intolerant to both atorvastatin and rosuvastatin in the past.  Reviewed options for lowering LDL cholesterol, including ezetimibe, PCSK-9 inhibitors and bempedoic acid.  Discussed mechanisms of action, dosing, side effects and potential decreases in LDL cholesterol.  Answered all patient questions.  Based on this information, patient would prefer to start Repatha 295 mg SureClick.  Will have him repeat labs after 5-6 doses to determine if further treatment is needed.     Tommy Medal PharmD CPP [facility] [address] [place] [address] 05/05/2020 1:25 PM

## 2020-05-05 NOTE — Assessment & Plan Note (Signed)
Patient with hyperlipidemia, LDL at 177 with no meds.  Has been intolerant to both atorvastatin and rosuvastatin in the past.  Reviewed options for lowering LDL cholesterol, including ezetimibe, PCSK-9 inhibitors and bempedoic acid.  Discussed mechanisms of action, dosing, side effects and potential decreases in LDL cholesterol.  Answered all patient questions.  Based on this information, patient would prefer to start Repatha 962 mg SureClick.  Will have him repeat labs after 5-6 doses to determine if further treatment is needed.

## 2020-05-05 NOTE — Patient Instructions (Addendum)
Return for a a follow up appointment with Dr. Oval Linsey  Go to the lab in 2-3 months to repeat cholesterol labs  Check your blood pressure at home daily and keep record of the readings.  Take your meds as follows:  Continue with all your current medications  Start Repatha 140 mg once every 14 days.  Grandville Silos will call you once we get approval from your insurance company  Bring all of your meds, your BP cuff and your record of home blood pressures to your next appointment.  Exercise as you're able, try to walk approximately 30 minutes per day.  Keep salt intake to a minimum, especially watch canned and prepared boxed foods.  Eat more fresh fruits and vegetables and fewer canned items.  Avoid eating in fast food restaurants.    HOW TO TAKE YOUR BLOOD PRESSURE: . Rest 5 minutes before taking your blood pressure. .  Don't smoke or drink caffeinated beverages for at least 30 minutes before. . Take your blood pressure before (not after) you eat. . Sit comfortably with your back supported and both feet on the floor (don't cross your legs). . Elevate your arm to heart level on a table or a desk. . Use the proper sized cuff. It should fit smoothly and snugly around your bare upper arm. There should be enough room to slip a fingertip under the cuff. The bottom edge of the cuff should be 1 inch above the crease of the elbow. . Ideally, take 3 measurements at one sitting and record the average.

## 2020-05-06 ENCOUNTER — Telehealth: Payer: Self-pay

## 2020-05-06 DIAGNOSIS — E782 Mixed hyperlipidemia: Secondary | ICD-10-CM

## 2020-05-06 MED ORDER — REPATHA SURECLICK 140 MG/ML ~~LOC~~ SOAJ: 140.0000 mg | SUBCUTANEOUS | 11 refills | Status: DC

## 2020-05-06 NOTE — Telephone Encounter (Signed)
Called and spoke to pt regarding the approval of the repatha, rx sent, pt instructed to complete fasting labs post 4th dose. Pt voiced understanding and labs ordered

## 2020-05-12 NOTE — Progress Notes (Signed)
[facility] PREP Weekly Session   Patient Details  Name: [name] MRN: [id number] Date of Birth: 1956-03-06 Age: 63 y.o. PCP: [name], MD  Vitals:   05/12/20 1416  Weight: 217 lb (98.4 kg)     [facility] Weekly seesion - 05/12/20 1400      Weekly Session   Topic Discussed Hitting roadblocks    Classes attended to date 52          Grateful for: up and about Barriers/struggles: vision   Barnett Hatter 05/12/2020, 2:17 PM

## 2020-05-14 ENCOUNTER — Telehealth: Payer: Self-pay

## 2020-05-14 DIAGNOSIS — E113511 Type 2 diabetes mellitus with proliferative diabetic retinopathy with macular edema, right eye: Secondary | ICD-10-CM | POA: Diagnosis not present

## 2020-05-14 DIAGNOSIS — H4313 Vitreous hemorrhage, bilateral: Secondary | ICD-10-CM | POA: Diagnosis not present

## 2020-05-14 DIAGNOSIS — E113532 Type 2 diabetes mellitus with proliferative diabetic retinopathy with traction retinal detachment not involving the macula, left eye: Secondary | ICD-10-CM | POA: Diagnosis not present

## 2020-05-14 DIAGNOSIS — H25813 Combined forms of age-related cataract, bilateral: Secondary | ICD-10-CM | POA: Diagnosis not present

## 2020-05-14 NOTE — Telephone Encounter (Signed)
   Primary Cardiologist: Skeet Latch, MD  Chart reviewed as part of pre-operative protocol coverage. Because of Areli Jowett Mesler's past medical history and time since last visit, he will require a follow-up visit in order to better assess preoperative cardiovascular risk.  Pre-op covering staff: - Please schedule appointment and call patient to inform them. If patient already had an upcoming appointment within acceptable timeframe, please add "pre-op clearance" to the appointment notes so provider is aware. - Please contact requesting surgeon's office via preferred method (i.e, phone, fax) to inform them of need for appointment prior to surgery.  If applicable, this message will also be routed to pharmacy pool and/or primary cardiologist for input on holding anticoagulant/antiplatelet agent as requested below so that this information is available to the clearing provider at time of patient's appointment.    Pt prefers to see Dr. Oval Linsey or PA on her team at [facility].   Tami Lin Toshiro Hanken, PA  05/14/2020, 11:33 AM

## 2020-05-14 NOTE — Telephone Encounter (Signed)
   [facility] Pre-operative Risk Assessment    HEARTCARE STAFF: - Please ensure there is not already an duplicate clearance open for this procedure. - Under Visit Info/Reason for Call, type in Other and utilize the format Clearance MM/DD/YY or Clearance TBD. Do not use dashes or single digits. - If request is for dental extraction, please clarify the # of teeth to be extracted.  Request for surgical clearance:  1. What type of surgery is being performed? Rotator Cuff Repair   2. When is this surgery scheduled? TBD   3. What type of clearance is required (medical clearance vs. Pharmacy clearance to hold med vs. Both)? medical  4. Are there any medications that need to be held prior to surgery and how long?Aspirin   5. Practice name and name of physician performing surgery? [facility] Dr. Susa Day  6. What is the office phone number? [phone]   7.   What is the office fax number? [phone] Attn. Derl Barrow  8.   Anesthesia type (None, local, MAC, general) ? General   Monia Pouch 05/14/2020, 9:31 AM  _________________________________________________________________   (provider comments below)

## 2020-05-15 ENCOUNTER — Telehealth: Payer: Self-pay | Admitting: *Deleted

## 2020-05-15 ENCOUNTER — Other Ambulatory Visit: Payer: Self-pay | Admitting: *Deleted

## 2020-05-15 DIAGNOSIS — R42 Dizziness and giddiness: Secondary | ICD-10-CM

## 2020-05-15 DIAGNOSIS — R270 Ataxia, unspecified: Secondary | ICD-10-CM

## 2020-05-15 NOTE — Telephone Encounter (Signed)
May have referral to ENT because of ataxia and dizziness

## 2020-05-15 NOTE — Telephone Encounter (Signed)
Patient stated he was seen in ER a few weeks ago for dizziness and followed up with his specialist who recommended he sees an ENT. Patient would like referral to ENT in [place]. Patient aware Dr Nicki Reaper out of office till Monday

## 2020-05-15 NOTE — Telephone Encounter (Signed)
Follow Up:      Pt is calling to check on the status of his clearance. 

## 2020-05-15 NOTE — Progress Notes (Signed)
amb ref ent

## 2020-05-15 NOTE — Telephone Encounter (Signed)
See clearance message from yesterday

## 2020-05-15 NOTE — Telephone Encounter (Signed)
Referral in epic. Patient notified.

## 2020-05-15 NOTE — Telephone Encounter (Signed)
Pt has appt scheduled with [name] 06-18-19 @130pm 

## 2020-05-15 NOTE — Telephone Encounter (Signed)
Forwarded to requesting providers via Epic fax 

## 2020-05-20 ENCOUNTER — Telehealth: Payer: Self-pay | Admitting: Neurology

## 2020-05-20 NOTE — Telephone Encounter (Signed)
Pt called wanting to know if [facility] can be called and informed that he is needing the pressure on his Cpap machine lowered . Pt would like to be messaged through MyChart when this is done or any other information. He states his phone is having trouble and the only way he can communicate at this time is through [place]. Please advise.

## 2020-05-21 ENCOUNTER — Ambulatory Visit: Payer: Medicaid Other | Attending: Internal Medicine

## 2020-05-21 DIAGNOSIS — Z23 Encounter for immunization: Secondary | ICD-10-CM

## 2020-05-21 NOTE — Progress Notes (Signed)
   Covid-19 Vaccination Clinic  Name:  [name]    MRN: [id number] DOB: 06/13/1956  05/21/2020  [name] was observed post Covid-19 immunization for 15 minutes without incident. He was provided with Vaccine Information Sheet and instruction to access the V-Safe system.   [name] was instructed to call 911 with any severe reactions post vaccine: Marland Kitchen Difficulty breathing  . Swelling of face and throat  . A fast heartbeat  . A bad rash all over body  . Dizziness and weakness   Immunizations Administered    Name Date Dose VIS Date Route   JANSSEN COVID-19 VACCINE 05/21/2020  2:28 PM 0.5 mL 04/01/2020 Intramuscular   Manufacturer: [employer]   Lot: [id number]   [place]: [phone]

## 2020-05-25 ENCOUNTER — Telehealth: Payer: Self-pay | Admitting: Neurology

## 2020-05-25 DIAGNOSIS — G4739 Other sleep apnea: Secondary | ICD-10-CM

## 2020-05-25 DIAGNOSIS — Z789 Other specified health status: Secondary | ICD-10-CM

## 2020-05-25 DIAGNOSIS — Z9989 Dependence on other enabling machines and devices: Secondary | ICD-10-CM

## 2020-05-25 DIAGNOSIS — G4733 Obstructive sleep apnea (adult) (pediatric): Secondary | ICD-10-CM

## 2020-05-25 DIAGNOSIS — T8189XA Other complications of procedures, not elsewhere classified, initial encounter: Secondary | ICD-10-CM

## 2020-05-25 NOTE — Telephone Encounter (Signed)
Order for ASV pressure reduction sent to [facility] via community message. Confirmation received that the order transmitted was successful.

## 2020-05-25 NOTE — Telephone Encounter (Signed)
Compliance data and MyChart email reviewed, he used his machine 7 out of 30 days in the past month, average AHI 0.8/h, leak acceptable with a 95th percentile at 13.7 L/min, ASV EPAP at 12 cm, minimum pressure 3 cm, maximum pressure 13 cm.  Please process ASV pressure change order and also request mask refit from his DME company.  Please advise patient that I have reviewed his compliance data, and we can try to reduce his ASV pressure from 12 cm to 10 cm at this time but I would also recommend that he go in for a mask refit to see how tight he actually has to tighten the mask as tightening it too much can also cause discomfort and actually increase the leak.

## 2020-06-08 NOTE — Telephone Encounter (Signed)
I called pt. He started ASV on 04/27/2020. His follow up tomorrow is too soon. He is agreeable to delaying his appt until 07/14/2020 at 1:00pm. Pt verbalized understanding of new appt date and time.

## 2020-06-09 ENCOUNTER — Ambulatory Visit: Payer: Self-pay | Admitting: Neurology

## 2020-06-12 ENCOUNTER — Emergency Department (HOSPITAL_COMMUNITY)
Admission: EM | Admit: 2020-06-12 | Discharge: 2020-06-12 | Disposition: A | Discharge status: Home / Self Care | Payer: Medicare Other | Attending: Emergency Medicine | Admitting: Emergency Medicine

## 2020-06-12 ENCOUNTER — Encounter (HOSPITAL_COMMUNITY): Payer: Self-pay | Admitting: Emergency Medicine

## 2020-06-12 ENCOUNTER — Other Ambulatory Visit: Payer: Self-pay

## 2020-06-12 DIAGNOSIS — Z794 Long term (current) use of insulin: Secondary | ICD-10-CM | POA: Diagnosis not present

## 2020-06-12 DIAGNOSIS — I2510 Atherosclerotic heart disease of native coronary artery without angina pectoris: Secondary | ICD-10-CM | POA: Insufficient documentation

## 2020-06-12 DIAGNOSIS — Z79899 Other long term (current) drug therapy: Secondary | ICD-10-CM | POA: Insufficient documentation

## 2020-06-12 DIAGNOSIS — I25810 Atherosclerosis of coronary artery bypass graft(s) without angina pectoris: Secondary | ICD-10-CM | POA: Diagnosis not present

## 2020-06-12 DIAGNOSIS — I110 Hypertensive heart disease with heart failure: Secondary | ICD-10-CM | POA: Diagnosis not present

## 2020-06-12 DIAGNOSIS — Z7982 Long term (current) use of aspirin: Secondary | ICD-10-CM | POA: Diagnosis not present

## 2020-06-12 DIAGNOSIS — I5041 Acute combined systolic (congestive) and diastolic (congestive) heart failure: Secondary | ICD-10-CM | POA: Insufficient documentation

## 2020-06-12 DIAGNOSIS — R7989 Other specified abnormal findings of blood chemistry: Secondary | ICD-10-CM | POA: Insufficient documentation

## 2020-06-12 DIAGNOSIS — R809 Proteinuria, unspecified: Secondary | ICD-10-CM | POA: Insufficient documentation

## 2020-06-12 DIAGNOSIS — K529 Noninfective gastroenteritis and colitis, unspecified: Secondary | ICD-10-CM | POA: Diagnosis not present

## 2020-06-12 DIAGNOSIS — E119 Type 2 diabetes mellitus without complications: Secondary | ICD-10-CM | POA: Diagnosis not present

## 2020-06-12 DIAGNOSIS — Z87891 Personal history of nicotine dependence: Secondary | ICD-10-CM | POA: Insufficient documentation

## 2020-06-12 DIAGNOSIS — A084 Viral intestinal infection, unspecified: Secondary | ICD-10-CM | POA: Diagnosis not present

## 2020-06-12 DIAGNOSIS — R1110 Vomiting, unspecified: Secondary | ICD-10-CM | POA: Diagnosis present

## 2020-06-12 LAB — LIPASE, BLOOD: Lipase: 23 U/L (ref 11–51)

## 2020-06-12 LAB — CBC
HCT: 37.4 % — ABNORMAL LOW (ref 39.0–52.0)
Hemoglobin: 12.1 g/dL — ABNORMAL LOW (ref 13.0–17.0)
MCH: 28.5 pg (ref 26.0–34.0)
MCHC: 32.4 g/dL (ref 30.0–36.0)
MCV: 88.2 fL (ref 80.0–100.0)
Platelets: 280 10*3/uL (ref 150–400)
RBC: 4.24 MIL/uL (ref 4.22–5.81)
RDW: 14.5 % (ref 11.5–15.5)
WBC: 8.9 10*3/uL (ref 4.0–10.5)
nRBC: 0 % (ref 0.0–0.2)

## 2020-06-12 LAB — URINALYSIS, ROUTINE W REFLEX MICROSCOPIC
Bacteria, UA: NONE SEEN
Bilirubin Urine: NEGATIVE
Glucose, UA: NEGATIVE mg/dL
Hgb urine dipstick: NEGATIVE
Ketones, ur: NEGATIVE mg/dL
Leukocytes,Ua: NEGATIVE
Nitrite: NEGATIVE
Protein, ur: >=300 mg/dL — AB
Specific Gravity, Urine: 1.019 (ref 1.005–1.030)
pH: 5 (ref 5.0–8.0)

## 2020-06-12 LAB — COMPREHENSIVE METABOLIC PANEL
ALT: 13 U/L (ref 0–44)
AST: 11 U/L — ABNORMAL LOW (ref 15–41)
Albumin: 4.3 g/dL (ref 3.5–5.0)
Alkaline Phosphatase: 42 U/L (ref 38–126)
Anion gap: 12 (ref 5–15)
BUN: 48 mg/dL — ABNORMAL HIGH (ref 8–23)
CO2: 21 mmol/L — ABNORMAL LOW (ref 22–32)
Calcium: 9.7 mg/dL (ref 8.9–10.3)
Chloride: 103 mmol/L (ref 98–111)
Creatinine, Ser: 3.37 mg/dL — ABNORMAL HIGH (ref 0.61–1.24)
GFR, Estimated: 20 mL/min — ABNORMAL LOW (ref >60)
Glucose, Bld: 169 mg/dL — ABNORMAL HIGH (ref 70–99)
Potassium: 4.3 mmol/L (ref 3.5–5.1)
Sodium: 136 mmol/L (ref 135–145)
Total Bilirubin: 0.9 mg/dL (ref 0.3–1.2)
Total Protein: 7.5 g/dL (ref 6.5–8.1)

## 2020-06-12 MED ORDER — SODIUM CHLORIDE 0.9 % IV BOLUS
1000.0000 mL | Freq: Once | INTRAVENOUS | Status: AC
  Administered 2020-06-12: 1000 mL via INTRAVENOUS

## 2020-06-12 MED ORDER — DICYCLOMINE HCL 20 MG PO TABS: 20.0000 mg | ORAL_TABLET | Freq: Two times a day (BID) | ORAL | 0 refills | Status: DC

## 2020-06-12 MED ORDER — FAMOTIDINE 20 MG PO TABS
20.0000 mg | ORAL_TABLET | Freq: Once | ORAL | Status: AC
  Administered 2020-06-12: 20 mg via ORAL
  Filled 2020-06-12: qty 1

## 2020-06-12 MED ORDER — FAMOTIDINE 20 MG PO TABS: 20.0000 mg | ORAL_TABLET | Freq: Two times a day (BID) | ORAL | 0 refills | Status: DC

## 2020-06-12 MED ORDER — ONDANSETRON 4 MG PO TBDP: 4.0000 mg | ORAL_TABLET | Freq: Three times a day (TID) | ORAL | 0 refills | Status: DC | PRN

## 2020-06-12 NOTE — ED Provider Notes (Signed)
[facility] EMERGENCY DEPARTMENT Provider Note   CSN: [id number] Arrival date & time: 06/12/20  1356     History Chief Complaint  Patient presents with  . Emesis    [name] is a 64 y.o. male with PMHx HTN, Diabetes, CAD, and CKD stage III who presents to the ED today with complaint of nausea and NBNB emesis that began yesterday around 5 PM. Pt reports he believes he ate some "bad fruit" yesterday around 3-4 PM. He denies it tasting off however that was the last thing he ate before having symptoms. Reports he had 2 episodes of emesis and has been dry heaving since however was able to tolerate PO earlier today without anymore dry heaving. He also has been having watery diarrhea. Pt states he has some abdominal cramping when he is having a BM or vomiting however will resolve afterwards and none currently. He took Peptobismal with some relief. Pt called Dr. Lance [name] office today and was told to come to the ED since his PCP is out of the office. Pt denies recent sick contacts. No recent abx use. Denies fevers, chills, chest pain, shortness of breath, cough, testicular pain, urinary symptoms, or any other associated symptoms.   The history is provided by the patient and medical records.       Past Medical History:  Diagnosis Date  . CAD (coronary artery disease) 03/03/2019  . Hypertension   . Type 2 diabetes mellitus with diabetic nephropathy ([place]) 04/23/2019    Patient Active Problem List   Diagnosis Date Noted  . Myalgia due to statin 03/11/2020  . Arthritis of both acromioclavicular joints 08/21/2019  . Peripheral arterial disease ([place]) 05/31/2019  . Edema 05/27/2019  . CAD (coronary artery disease) 05/27/2019  . DM type 2 causing vascular disease ([place]) 04/23/2019  . S/P CABG (coronary artery bypass graft) 03/25/2019  . Moderate nonproliferative diabetic retinopathy of both eyes associated with type 2 diabetes mellitus ([place]) 03/18/2019  . Special screening for malignant  neoplasms, colon 02/04/2019  . Family hx of colon cancer 02/04/2019  . Cardiomyopathy ([place]) 06/08/2018  . CRI (chronic renal insufficiency), stage 3 (moderate) ([place]) 06/08/2018  . Acute combined systolic (congestive) and diastolic (congestive) heart failure ([place]) 04/26/2018  . Essential hypertension, benign 04/26/2018  . Diabetic nephropathy ([place]) 04/26/2018  . Hypertensive urgency 04/26/2018  . Mixed hyperlipidemia 04/26/2018  . Elevated troponin 04/26/2018    Past Surgical History:  Procedure Laterality Date  . BIOPSY  06/27/2019   Procedure: BIOPSY;  Surgeon: [name] Houston, MD;  [facility];  Service: Endoscopy;;  ascending colon polyp  . CARDIAC SURGERY    . COLONOSCOPY N/A 06/27/2019   Procedure: COLONOSCOPY;  Surgeon: [name] Houston, MD;  [facility];  Service: Endoscopy;  Laterality: N/A;  1030am-office rescheduled to 06/27/19 @9 :55am  . CORONARY ARTERY BYPASS GRAFT N/A 03/25/2019   Procedure: CORONARY ARTERY BYPASS GRAFTING (CABG) X  4 USING LEFT INTERNAL MAMMARY ARTERY AND RIGHT SAPHENOUS VEIN GRAFTS;  Surgeon: [name] Matte, MD;  Location: [place];  Service: Open Heart Surgery;  Laterality: N/A;  . EXCISION MORTON'S NEUROMA    . POLYPECTOMY  06/27/2019   Procedure: POLYPECTOMY;  Surgeon: [name] Houston, MD;  [facility];  Service: Endoscopy;;  proximal sigmoid colon  . RIGHT/LEFT HEART CATH AND CORONARY ANGIOGRAPHY N/A 03/12/2019   Procedure: RIGHT/LEFT HEART CATH AND CORONARY ANGIOGRAPHY;  Surgeon: [name] Booze, MD;  Location: [place] [facility];  Service: Cardiovascular;  Laterality: N/A;  . TEE WITHOUT CARDIOVERSION N/A 03/25/2019   Procedure: TRANSESOPHAGEAL ECHOCARDIOGRAM (TEE);  Surgeon: [name] Matte, MD;  Location: [place];  Service: Open Heart Surgery;  Laterality: N/A;       Family History  Problem Relation Age of Onset  . Colon cancer Mother   . Heart Problems Father   . Diabetes Father   . Valvular  heart disease Father     Social History   Tobacco Use  . Smoking status: Former Smoker    Quit date: 07/06/1988    Years since quitting: 31.9  . Smokeless tobacco: Never Used  Vaping Use  . Vaping Use: Never used  Substance Use Topics  . Alcohol use: Not Currently    Comment: socially  . Drug use: No    Home Medications Prior to Admission medications   Medication Sig Start Date End Date Taking? Authorizing Provider  acetaminophen (TYLENOL) 500 MG tablet Take 1,000 mg by mouth every 6 (six) hours as needed for moderate pain or headache.    Yes [provider]  albuterol (VENTOLIN HFA) 108 (90 Base) MCG/ACT inhaler Inhale 2 puffs into the lungs every 6 (six) hours as needed. 02/26/20  Yes [name] Geralds, MD  amLODipine (NORVASC) 5 MG tablet Take 5 mg by mouth daily.   Yes [provider]  aspirin EC 81 MG tablet Take 81 mg by mouth daily.   Yes [provider]  Cholecalciferol (VITAMIN D-3) 125 MCG (5000 UT) TABS Take 5,000 Units by mouth daily.   Yes [provider]  Coenzyme Q10 (COQ10 PO) Take 1 tablet by mouth daily.   Yes [provider]  Cyanocobalamin (B-12) 2500 MCG TABS Take 2,500 mcg by mouth daily.   Yes [provider]  dicyclomine (BENTYL) 20 MG tablet Take 1 tablet (20 mg total) by mouth 2 (two) times daily. 06/12/20  Yes [name] Charpentier, PA-C  diphenhydramine-acetaminophen (TYLENOL PM) 25-500 MG TABS tablet Take 1 tablet by mouth at bedtime as needed.   Yes [provider]  doxazosin (CARDURA) 4 MG tablet Take 1 tablet (4 mg total) by mouth at bedtime. 01/20/20  Yes [name] Latch, MD  Evolocumab (REPATHA SURECLICK) 332 MG/ML SOAJ Inject 140 mg into the skin every 14 (fourteen) days. 05/06/20  Yes [name] Latch, MD  famotidine (PEPCID) 20 MG tablet Take 1 tablet (20 mg total) by mouth 2 (two) times daily. 06/12/20  Yes [name] Esperanza, PA-C  furosemide (LASIX) 40 MG tablet TAKE 1 AND 1/2 TABLETS BY MOUTH  DAILY Patient taking differently: TAKE 1 TABLET BY MOUTH DAILY 02/05/20  Yes [name] Latch, MD  HYDROcodone-acetaminophen (NORCO/VICODIN) 5-325 MG tablet Take 1 tablet by mouth every 6 (six) hours as needed for moderate pain. 05/01/20  Yes [name], [name], MD  insulin glargine (LANTUS SOLOSTAR) 100 UNIT/ML Solostar Pen Inject 25 Units into the skin every morning. Patient taking differently: Inject 18 Units into the skin every morning. 12/10/19  Yes Renato Shin, MD  Magnesium 100 MG CAPS Take 1 capsule by mouth daily.   Yes [provider]  Multiple Vitamin (MULTIVITAMIN WITH MINERALS) TABS tablet Take 1 tablet by mouth daily.   Yes [provider]  ondansetron (ZOFRAN ODT) 4 MG disintegrating tablet Take 1 tablet (4 mg total) by mouth every 8 (eight) hours as needed for nausea or vomiting. 06/12/20  Yes Frenchie Dangerfield, PA-C  Semaglutide (OZEMPIC, 0.25 OR 0.5 MG/DOSE, [place]) Inject into the skin. Once a week injection   Yes [provider]  sertraline (ZOLOFT) 100 MG tablet Take 1 tablet (100 mg total) by mouth daily. 03/10/20  Yes [name], MD  valsartan (DIOVAN) 80 MG tablet Take 1 tablet (80 mg total) by mouth daily. 02/20/20  Yes [name] Latch, MD  Insulin Pen Needle (B-D ULTRAFINE III SHORT PEN) 31G X 8 MM MISC 1 each by Does not apply route as directed. 05/30/19   Cassandria Anger, MD    Allergies    Bee venom, Atorvastatin, and Rosuvastatin  Review of Systems   Review of Systems  Constitutional: Negative for chills and fever.  Respiratory: Negative for cough and shortness of breath.   Cardiovascular: Negative for chest pain.  Gastrointestinal: Positive for abdominal pain, diarrhea, nausea and vomiting. Negative for blood in stool and constipation.  Genitourinary: Negative for difficulty urinating, flank pain, frequency, hematuria, scrotal swelling and testicular pain.  All other systems reviewed and are negative.   Physical Exam Updated  Vital Signs BP 127/90 (BP Location: Right Arm)   Pulse 95   Temp 98.6 F (37 C) (Oral)   Resp 18   Ht 5\' 10"  (1.778 m)   Wt 97.5 kg   SpO2 100%   BMI 30.85 kg/m   Physical Exam Vitals and nursing note reviewed.  Constitutional:      Appearance: He is not ill-appearing or diaphoretic.  HENT:     Head: Normocephalic and atraumatic.     Mouth/Throat:     Mouth: Mucous membranes are dry.  Eyes:     Conjunctiva/sclera: Conjunctivae normal.  Cardiovascular:     Rate and Rhythm: Normal rate and regular rhythm.     Pulses: Normal pulses.  Pulmonary:     Effort: Pulmonary effort is normal.     Breath sounds: Normal breath sounds. No wheezing, rhonchi or rales.  Abdominal:     Palpations: Abdomen is soft.     Tenderness: There is no abdominal tenderness. There is no right CVA tenderness, left CVA tenderness, guarding or rebound.  Musculoskeletal:     Cervical back: Neck supple.  Skin:    General: Skin is warm and dry.  Neurological:     Mental Status: He is alert.     ED Results / Procedures / Treatments   Labs (all labs ordered are listed, but only abnormal results are displayed) Labs Reviewed  COMPREHENSIVE METABOLIC PANEL - Abnormal; Notable for the following components:      Result Value   CO2 21 (*)    Glucose, Bld 169 (*)    BUN 48 (*)    Creatinine, Ser 3.37 (*)    AST 11 (*)    GFR, Estimated 20 (*)    All other components within normal limits  CBC - Abnormal; Notable for the following components:   Hemoglobin 12.1 (*)    HCT 37.4 (*)    All other components within normal limits  URINALYSIS, ROUTINE W REFLEX MICROSCOPIC - Abnormal; Notable for the following components:   APPearance HAZY (*)    Protein, ur >=300 (*)    All other components within normal limits  LIPASE, BLOOD    EKG None  Radiology No results found.  Procedures Procedures (including critical care time)  Medications Ordered in ED Medications  famotidine (PEPCID) tablet 20 mg (has  no administration in time range)  sodium chloride 0.9 % bolus 1,000 mL (0 mLs Intravenous Stopped 06/12/20 1703)    ED Course  I have reviewed the triage vital signs and the nursing notes.  Pertinent labs & imaging results that were available during my care of the patient were reviewed by me and considered in my medical decision making (see chart for details).    MDM Rules/Calculators/A&P                          64 year old male presents to the ED today complaining of nausea, nonbloody nonbilious emesis, diarrhea with abdominal cramping during bowel movement/emesis that started last night.  He states that he believes he ate some bad fruit prior to his symptoms beginning.  Was told by PCPs office to come to the ED given his PCP is out of office currently.  Patient has been able to tolerate p.o. this morning however states he feels dehydrated.  On arrival to the ED vitals are stable.  Patient is afebrile, nontachycardic nontachypneic.  He appears to be in no acute distress.  Lab work was obtained while patient was in the waiting room: CBC without leukocytosis.  Hemoglobin of 12.1 which is around patient's baseline.  Lipase within normal limits at 23.  CMP with a creatinine of 3.37 which is slightly elevated from baseline with a BUN of 48.  Glucose 169, bicarb 21, no gap.  No other electrolyte abnormalities and LFTs unremarkable at this time.   Lab Results  Component Value Date   CREATININE 3.37 (H) 06/12/2020   CREATININE 2.72 (H) 04/28/2020   CREATININE 2.23 (H) 03/10/2020   On exam patient is comfortable.  He has no abdominal tenderness palpation his abdomen is soft.  Doubt acute abdomen. He does have some dry mucous membranes.  Will provide fluids given worsening kidney function and dehydration.  We will plan to fluid challenge however I suspect his symptoms are likely related to viral gastroenteritis.  Given his overall well appearance I do not feel he needs a CT scan at this time.  We will  continue to monitor.   On reevaluation after fluids pt reports he feels much better and is ready to go home. He still has not urinated for [place] however would like him to do so - will provide fluids and fluid challenge him and hopefully this will stimulate him to urinate.   Pt able to urinate after fluids. Passed fluid challenge successfully however reports he feels like he is having indigestion now and is requesting something - pepcid provided. Will await U/A and if no acute findings will discharge home with zofran and bentyl and Rx pepcid per pt request.   U/A with protein; no other findings. Will discharge home at this time. Suspect symptoms related to viral gastroenteritis. Pt instructed to push fluids and have his kidney function rechecked next week. He is instructed on strict return precautions as well. Pt in agreement with plan and stable for discharge home.   This note was prepared using Dragon voice recognition software and may include unintentional dictation errors due to the inherent limitations of voice recognition software.  Final Clinical Impression(s) / ED Diagnoses Final diagnoses:  Viral gastroenteritis  Proteinuria, unspecified type  Elevated serum creatinine    Rx / DC Orders ED Discharge Orders         Ordered    ondansetron (ZOFRAN ODT) 4 MG disintegrating tablet  Every 8 hours PRN        06/12/20 1821    dicyclomine (BENTYL) 20 MG tablet  2 times daily        06/12/20 1821    famotidine (PEPCID) 20 MG tablet  2 times daily        06/12/20 1821           Discharge Instructions     Please pick up medication and take as needed for nausea and abdominal cramping. Drink plenty of fluids in the next week as your kidney function was elevated - you will need to have your kidney function rechecked either by your PCP or nephrologist next week.   You were also found to have protein in your urine today - follow up with nephrologist regarding this  Return to the ED  IMMEDIATELY for any worsening symptoms including excessive vomiting, constant abdominal pain not only present while you are having a bowel movement/vomiting, fevers > 100.4, chills, or any other new/concerning symptoms.        Eustaquio Maize, PA-C 06/12/20 1846    Varney Biles, MD 06/14/20 2258

## 2020-06-12 NOTE — ED Notes (Signed)
POs offered   Pt reports continued inability to urinate

## 2020-06-12 NOTE — ED Triage Notes (Signed)
Vomiting and diarrhea that started yesterday. Has not had and episodes of vomiting or diarrhea today but feels weak.

## 2020-06-12 NOTE — ED Notes (Signed)
Pt reports several episodes of D yesterday (5) and vomiting several times   Reports he believes he got food poisoning   Moist mucous membranes , large full vessels   Here for feeling of weakness

## 2020-06-12 NOTE — Discharge Instructions (Addendum)
Please pick up medication and take as needed for nausea and abdominal cramping. Drink plenty of fluids in the next week as your kidney function was elevated - you will need to have your kidney function rechecked either by your PCP or nephrologist next week.   You were also found to have protein in your urine today - follow up with nephrologist regarding this  Return to the ED IMMEDIATELY for any worsening symptoms including excessive vomiting, constant abdominal pain not only present while you are having a bowel movement/vomiting, fevers > 100.4, chills, or any other new/concerning symptoms.

## 2020-06-12 NOTE — ED Notes (Signed)
Patient denies pain and is resting comfortably.  

## 2020-06-15 ENCOUNTER — Telehealth: Payer: Self-pay

## 2020-06-15 NOTE — Telephone Encounter (Signed)
Transition Care Management Unsuccessful Follow-up Telephone Call  Date of discharge and from where:  06/12/2020 from [facility]   Attempts:  1st Attempt  Reason for unsuccessful TCM follow-up call:  Left voice message

## 2020-06-16 NOTE — Telephone Encounter (Signed)
Transition Care Management Follow-up Telephone Call  Date of discharge and from where: 06/12/2020 from [facility]  How have you been since you were released from the hospital? Patient is feeling some better but he is still having vomiting and diarrhea.   Any questions or concerns? No  Items Reviewed:  Did the pt receive and understand the discharge instructions provided? Yes   Medications obtained and verified? Yes   Other? No   Any new allergies since your discharge? No   Dietary orders reviewed? N/A  Do you have support at home? Yes   Functional Questionnaire: (I = Independent and D = Dependent) ADLs: I  Bathing/Dressing- I  Meal Prep- I  Eating- I  Maintaining continence- I  Transferring/Ambulation- I  Managing Meds- I   Follow up appointments reviewed:   PCP Hospital f/u appt confirmed? Yes  Scheduled to see [name], MD on 09/01/2020  @ 1:40pm.  [facility] f/u appt confirmed? Yes  Scheduled to see Skeet Latch, MD on 06/17/2020 @ 1:30pm.  Are transportation arrangements needed? No   If their condition worsens, is the pt aware to call PCP or go to the Emergency Dept.? Yes  Was the patient provided with contact information for the PCP's office or ED? Yes  Was to pt encouraged to call back with questions or concerns? Yes

## 2020-06-17 ENCOUNTER — Encounter: Payer: Self-pay | Admitting: Cardiovascular Disease

## 2020-06-17 ENCOUNTER — Ambulatory Visit: Payer: Medicaid Other | Admitting: Cardiovascular Disease

## 2020-06-17 ENCOUNTER — Encounter: Payer: Self-pay | Admitting: *Deleted

## 2020-06-17 ENCOUNTER — Other Ambulatory Visit: Payer: Self-pay

## 2020-06-17 VITALS — BP 166/94 | HR 82 | Ht 70.0 in | Wt 211.2 lb

## 2020-06-17 DIAGNOSIS — I2510 Atherosclerotic heart disease of native coronary artery without angina pectoris: Secondary | ICD-10-CM | POA: Diagnosis not present

## 2020-06-17 DIAGNOSIS — E782 Mixed hyperlipidemia: Secondary | ICD-10-CM | POA: Diagnosis not present

## 2020-06-17 DIAGNOSIS — I10 Essential (primary) hypertension: Secondary | ICD-10-CM

## 2020-06-17 DIAGNOSIS — I5041 Acute combined systolic (congestive) and diastolic (congestive) heart failure: Secondary | ICD-10-CM

## 2020-06-17 DIAGNOSIS — I739 Peripheral vascular disease, unspecified: Secondary | ICD-10-CM | POA: Diagnosis not present

## 2020-06-17 NOTE — Progress Notes (Signed)
Hypertension Clinic Initial Assessment:    Date:  06/17/2020   ID:  [name], DOB 05-Dec-1955, MRN [id number]  PCP:  [name], MD  Cardiologist:  Skeet Latch, MD  Nephrologist:  Referring MD: [name], MD   CC: Hypertension  History of Present Illness:    [name] is a 65 y.o. male with a hx of CAD status post CABG, chronic systolic and diastolic heart failure, hypertension, hyperlipidemia, diabetes, peripheral arterial disease, and CKD 3 here to establish care in the hypertension clinic.  He had a four-vessel CABG on 03/2019.  He has struggled with poorly controlled blood pressures and his blood pressure has been as high as the 200s over 100s.  He has not tolerated amlodipine, lisinopril, HCTZ, or beta-blockers due to them making him feel fatigued.  He saw [name] on 7/23 and his blood pressure was 172/88.  He was referred to advanced hypertension clinic for further evaluation and management.  He notes that his BP is labile.   At his intial appointment he reported shortness of breath and lightheadedness.  Dr. Moshe Cipro started him on lasix which has helped his edema.  This was increased to 60mg  daily after a hospitalization for acute on chronic diastolic heart failure.  He has been working on his diet.  He limits carbohydrates and has a high protein diet.  He limits red meat.  He drinks a lot of water and limits soda.  He quit smoking 25-30 years ago.  He smoked 2ppd x10-15 years.   At his initial appointment he was started on doxazosin.  He followed up with our pharmacist and started on valsartan.  He continued to struggle with dizziness.  At that visit he was mildly orthostatic.  His BP dropped from 134 to 126.  He has no energy and gets lightheaded upon standing.  He also struggles with his vision.  He had a hard time exercising at the [facility] because of lightheadedness and fatigue.  He did the recumbant bike and had no exertional chest pain and felt well.   He was referred for a slep study and found to have mild OSA.  An ASV was recommended.  He presents today for preoperative risk assessment prior to shoulder surgery.  He reports memory loss and word-finding difficulty since his CABG.    Previous antihypertensives: Amlodipine Lisinopril HCTZ Beta-blockers Hydralazine   Past Medical History:  Diagnosis Date  . CAD (coronary artery disease) 03/03/2019  . Hypertension   . Type 2 diabetes mellitus with diabetic nephropathy ([place]) 04/23/2019    Past Surgical History:  Procedure Laterality Date  . BIOPSY  06/27/2019   Procedure: BIOPSY;  Surgeon: Rogene Houston, MD;  [facility];  Service: Endoscopy;;  ascending colon polyp  . CARDIAC SURGERY    . COLONOSCOPY N/A 06/27/2019   Procedure: COLONOSCOPY;  Surgeon: Rogene Houston, MD;  [facility];  Service: Endoscopy;  Laterality: N/A;  1030am-office rescheduled to 06/27/19 @9 :55am  . CORONARY ARTERY BYPASS GRAFT N/A 03/25/2019   Procedure: CORONARY ARTERY BYPASS GRAFTING (CABG) X  4 USING LEFT INTERNAL MAMMARY ARTERY AND RIGHT SAPHENOUS VEIN GRAFTS;  Surgeon: [name], MD;  Location: [place];  Service: Open Heart Surgery;  Laterality: N/A;  . EXCISION MORTON'S NEUROMA    . POLYPECTOMY  06/27/2019   Procedure: POLYPECTOMY;  Surgeon: Rogene Houston, MD;  [facility];  Service: Endoscopy;;  proximal sigmoid colon  . RIGHT/LEFT HEART CATH  AND CORONARY ANGIOGRAPHY N/A 03/12/2019   Procedure: RIGHT/LEFT HEART CATH AND CORONARY ANGIOGRAPHY;  Surgeon: [name], MD;  Location: [place] [facility];  Service: Cardiovascular;  Laterality: N/A;  . TEE WITHOUT CARDIOVERSION N/A 03/25/2019   Procedure: TRANSESOPHAGEAL ECHOCARDIOGRAM (TEE);  Surgeon: [name], MD;  Location: [place];  Service: Open Heart Surgery;  Laterality: N/A;    Current Medications: Current Meds  Medication Sig  . acetaminophen (TYLENOL) 500 MG tablet Take 1,000 mg by  mouth every 6 (six) hours as needed for moderate pain or headache.   . albuterol (VENTOLIN HFA) 108 (90 Base) MCG/ACT inhaler Inhale 2 puffs into the lungs every 6 (six) hours as needed.  Marland Kitchen amLODipine (NORVASC) 5 MG tablet Take 5 mg by mouth daily.  Marland Kitchen aspirin EC 81 MG tablet Take 81 mg by mouth daily.  . Cholecalciferol (VITAMIN D-3) 125 MCG (5000 UT) TABS Take 5,000 Units by mouth daily.  . Coenzyme Q10 (COQ10 PO) Take 1 tablet by mouth daily.  . Cyanocobalamin (B-12) 2500 MCG TABS Take 2,500 mcg by mouth daily.  Marland Kitchen dicyclomine (BENTYL) 20 MG tablet Take 1 tablet (20 mg total) by mouth 2 (two) times daily.  . diphenhydramine-acetaminophen (TYLENOL PM) 25-500 MG TABS tablet Take 1 tablet by mouth at bedtime as needed.  . doxazosin (CARDURA) 4 MG tablet Take 4 mg by mouth as directed. TAKE 1/2 TABLET AT BEDTIME  . Evolocumab (REPATHA SURECLICK) 749 MG/ML SOAJ Inject 140 mg into the skin every 14 (fourteen) days.  . famotidine (PEPCID) 20 MG tablet Take 1 tablet (20 mg total) by mouth 2 (two) times daily.  . furosemide (LASIX) 40 MG tablet TAKE 1 AND 1/2 TABLETS BY MOUTH DAILY (Patient taking differently: TAKE 1 TABLET BY MOUTH DAILY)  . HYDROcodone-acetaminophen (NORCO/VICODIN) 5-325 MG tablet Take 1 tablet by mouth every 6 (six) hours as needed for moderate pain.  Marland Kitchen insulin glargine (LANTUS SOLOSTAR) 100 UNIT/ML Solostar Pen Inject 25 Units into the skin every morning. (Patient taking differently: Inject 18 Units into the skin every morning.)  . Insulin Pen Needle (B-D ULTRAFINE III SHORT PEN) 31G X 8 MM MISC 1 each by Does not apply route as directed.  . Magnesium 100 MG CAPS Take 1 capsule by mouth daily.  . Multiple Vitamin (MULTIVITAMIN WITH MINERALS) TABS tablet Take 1 tablet by mouth daily.  . ondansetron (ZOFRAN ODT) 4 MG disintegrating tablet Take 1 tablet (4 mg total) by mouth every 8 (eight) hours as needed for nausea or vomiting.  . Semaglutide (OZEMPIC, 0.25 OR 0.5 MG/DOSE, [place]) Inject  into the skin. Once a week injection  . sertraline (ZOLOFT) 100 MG tablet Take 1 tablet (100 mg total) by mouth daily.  . valsartan (DIOVAN) 80 MG tablet Take 1 tablet (80 mg total) by mouth daily.  . [DISCONTINUED] doxazosin (CARDURA) 4 MG tablet Take 1 tablet (4 mg total) by mouth at bedtime. (Patient taking differently: Take 4 mg by mouth as directed. TAKE 1/2 TABLET AT BEDTIME)     Allergies:   Bee venom, Atorvastatin, and Rosuvastatin   Social History   Socioeconomic History  . Marital status: Single    Spouse name: Not on file  . Number of children: Not on file  . Years of education: Not on file  . Highest education level: Not on file  Occupational History  . Not on file  Tobacco Use  . Smoking status: Former Smoker    Quit date: 07/06/1988    Years since  quitting: 31.9  . Smokeless tobacco: Never Used  Vaping Use  . Vaping Use: Never used  Substance and Sexual Activity  . Alcohol use: Not Currently    Comment: socially  . Drug use: No  . Sexual activity: Not on file  Other Topics Concern  . Not on file  Social History Narrative  . Not on file   Social Determinants of Health   Financial Resource Strain: Not on file  Food Insecurity: Not on file  Transportation Needs: Not on file  Physical Activity: Not on file  Stress: Not on file  Social Connections: Not on file     Family History: The patient's family history includes Colon cancer in his mother; Diabetes in his father; Heart Problems in his father; Valvular heart disease in his father.  ROS:   Please see the history of present illness.    All other systems reviewed and are negative.  EKGs/Labs/Other Studies Reviewed:    EKG:  EKG is not ordered today.  The ekg ordered 01/01/20 demonstrates Sinus rhythm.  Rate 87 bpm.  Nonspecific T wave abnormalities.  Echo 09/2019: IMPRESSIONS   1. Left ventricular ejection fraction, by estimation, is 55 to 60%. The  left ventricle has normal function. The left  ventricle has no regional  wall motion abnormalities. There is moderate left ventricular hypertrophy.  Left ventricular diastolic  parameters are consistent with Grade I diastolic dysfunction (impaired  relaxation).  2. Right ventricular systolic function is normal. The right ventricular  size is normal.  3. Left atrial size was severely dilated.  4. The mitral valve is normal in structure. Trivial mitral valve  regurgitation. No evidence of mitral stenosis.  5. The aortic valve was not well visualized. Aortic valve regurgitation  is not visualized. No aortic stenosis is present.  6. The inferior vena cava is normal in size with greater than 50%  respiratory variability, suggesting right atrial pressure of 3 mmHg.    Recent Labs: 12/31/2019: B Natriuretic Peptide 1,409.0 06/12/2020: ALT 13; BUN 48; Creatinine, Ser 3.37; Hemoglobin 12.1; Platelets 280; Potassium 4.3; Sodium 136   Recent Lipid Panel    Component Value Date/Time   CHOL 265 (H) 01/09/2020 1335   TRIG 234 (H) 01/09/2020 1335   HDL 44 01/09/2020 1335   CHOLHDL 6.0 (H) 01/09/2020 1335   CHOLHDL 3.9 04/26/2018 1011   VLDL 20 04/26/2018 1011   LDLCALC 177 (H) 01/09/2020 1335    Physical Exam:    VS:  BP (!) 166/94   Pulse 82   Ht 5\' 10"  (1.778 m)   Wt 211 lb 3.2 oz (95.8 kg)   BMI 30.30 kg/m  , BMI Body mass index is 30.3 kg/m. GENERAL:  Well appearing HEENT: Pupils equal round and reactive, fundi not visualized, oral mucosa unremarkable NECK:  No jugular venous distention, waveform within normal limits, carotid upstroke brisk and symmetric, no bruits, no thyromegaly LUNGS:  Clear to auscultation bilaterally HEART:  RRR.  PMI not displaced or sustained,S1 and S2 within normal limits, no S3, no S4, no clicks, no rubs, no murmurs ABD:  Flat, positive bowel sounds normal in frequency in pitch, no bruits, no rebound, no guarding, no midline pulsatile mass, no hepatomegaly, no splenomegaly EXT:  2 plus pulses  throughout, no edema, no cyanosis no clubbing SKIN:  No rashes no nodules NEURO:  Cranial nerves II through XII grossly intact, motor grossly intact throughout PSYCH:  Cognitively intact, oriented to person place and time   ASSESSMENT:  1. Essential hypertension   2. Acute combined systolic (congestive) and diastolic (congestive) heart failure ([place])   3. Coronary artery disease involving native coronary artery of native heart without angina pectoris   4. Essential hypertension, benign   5. Peripheral arterial disease ([place])   6. Mixed hyperlipidemia     PLAN:    # Essential hypertension:  # Orthostatic hypotension: BP has been much better controlled.  However he struggles with orthostatic symptoms.  His initial blood pressure was elevated.  However on follow-up was only 120/78 and dropped to the 90s upon standing.  I suspect this is was causing his orthostatic dizziness.  We will try reducing doxazosin to 2 mg.  Will log his blood pressures and bring to follow-up.  Continue amlodipine and valsartan.  Secondary Causes of Hypertension  Medications/Herbal: OCP, steroids, stimulants, antidepressants, weight loss medication, immune suppressants, NSAIDs, sympathomimetics, alcohol-rare, caffeine, licorice, ginseng, St. John's wort, chemo  Sleep Apnea- patient denies snoring/apnea Renal artery stenosis: testing not indicated Hyperaldosteronism: testing not indicated Hyper/hypothyroidism- thyroid normal 12/2019 Pheochromocytoma: (testing not indicated)  Cushing's syndrome: (testing not indicated) Coarctation of the aorta  # CAD s/p CABG:  # Hyperlipidemia: Doing well.  Continue aspirin.  History of myalgias on statins and fatigue on beta-blockers.  Continue Repatha.  Check lipids at follow up.    Disposition:    FU with MD/PharmD in 1 month    Medication Adjustments/Labs and Tests Ordered: Current medicines are reviewed at length with the patient today.  Concerns regarding  medicines are outlined above.  Orders Placed This Encounter  Procedures  . EKG 12-Lead   No orders of the defined types were placed in this encounter.    Signed, Skeet Latch, MD  06/17/2020 4:52 PM    [place]

## 2020-06-17 NOTE — Patient Instructions (Addendum)
Medication Instructions:  DECREASE YOUR DOXAZOSIN TO 4 MG 1/2 TABLET AT BEDTIME  Labwork: NONE  Testing/Procedures: NONE  Follow-Up: 07/21/2020 AT 1:30 PM WITH PHARM D  If you need a refill on your cardiac medications before your next appointment, please call your pharmacy.  YOU ARE CLEARED FROM CARDIAC STANDPOINT FOR UPCOMING PROCEDURE WITH DR Tonita Cong

## 2020-06-30 ENCOUNTER — Telehealth: Payer: Self-pay | Admitting: *Deleted

## 2020-06-30 NOTE — Telephone Encounter (Signed)
Fax from [place] stating pt has requested a topical cream for pain instead of an oral medication. Requesting lidocaine 5% ointment 1063.20 gram apply 2 -3 grams topically to affected area 3 -4 times per day, diclofenac sodium 3% gel 700 grams apply 1 -2 grams topically to affected area 4 times per day and steril alcohol prep pads 100/box. See form in your box.

## 2020-07-02 NOTE — Telephone Encounter (Signed)
So I am willing to try to be helpful to [place] but I am always suspicious of these type of prescriptions.  Often these type of pharmacies compound a gel or cream that the insurance will cover, and charging insurance an exorbitant amount of money for these.  I almost always prefer to utilize local pharmacy such as [facility] or other pharmacies because at least I feel that they are somewhat reputable  As for the requested supplies I highly recommend against lidocaine ointment because of potential for toxicity.  As for Voltaren gel I recommend OTC gel or getting it locally through [facility].  As for the sterile prep pads that they are asking for-according to the pharmacy they want the patient to use 1 of these pads on the skin every time they applied a topical cream which is really overkill if not suspicious over use.  Needless to say I am less than thrilled with signing prescriptions for this type of pharmacy I would suggest to the patient trying OTC Voltaren gel or getting it through his pharmacy locally such as a [place]  So therefore I will not be signing these prescriptions..... Thanks-[name]

## 2020-07-03 ENCOUNTER — Other Ambulatory Visit: Payer: Self-pay | Admitting: Family Medicine

## 2020-07-03 ENCOUNTER — Other Ambulatory Visit: Payer: Self-pay | Admitting: *Deleted

## 2020-07-03 DIAGNOSIS — M25511 Pain in right shoulder: Secondary | ICD-10-CM

## 2020-07-03 NOTE — Patient Instructions (Signed)
Visit Information  [name]  - as a part of your Medicaid benefit, you are eligible for care management and care coordination services at no cost or copay. I was unable to reach you by phone today but would be happy to help you with your health related needs. Please feel free to call me @ [phone].   A member of the Managed Medicaid care management team will reach out to you again over the next 7-14 days.   Lurena Joiner RN, BSN [place]  [employer] manager

## 2020-07-03 NOTE — Patient Outreach (Signed)
Care Coordination  07/03/2020  [place] 1955/07/10 [id number]    Medicaid Managed Care   Unsuccessful Outreach Note  07/03/2020 Name: [name] MRN: [id number] DOB: 1955-10-25  Referred by: [name], MD Reason for referral : High Risk Managed Medicaid (Unsuccessful telephone outreach)   An unsuccessful telephone outreach was attempted today. The patient was referred to the case management team for assistance with care management and care coordination.   Follow Up Plan: The care management team will reach out to the patient again over the next 7-14 days.   Lurena Joiner RN, BSN [place]  [employer] manager

## 2020-07-03 NOTE — Telephone Encounter (Signed)
Patient advised per Dr Nicki Reaper: Claudette Stapler is willing to try to be helpful to [facility] but he is always suspicious of these type of prescriptions.  Often these type of pharmacies compound a gel or cream that the insurance will cover, and charging insurance an exorbitant amount of money for these.   Dr Nicki Reaper almost always prefer to utilize local pharmacy such as [facility] or other pharmacies because at least he feels that they are somewhat reputable   As for the requested supplies Dr Nicki Reaper highly recommends against lidocaine ointment because of potential for toxicity.  As for Voltaren gel he recommend OTC gel or getting it locally through [facility].   As for the sterile prep pads that they are asking for-according to the pharmacy they want the patient to use 1 of these pads on the skin every time they applied a topical cream which is really overkill if not suspicious over use.   Needless to say Dr Nicki Reaper is less than thrilled with signing prescriptions for this type of pharmacy and would suggest to the patient trying OTC Voltaren gel or getting it through his pharmacy locally such as a [place] [facility]   So therefore Dr Nicki Reaper will not be signing these prescriptions  Patient verbalized understanding.

## 2020-07-07 ENCOUNTER — Other Ambulatory Visit: Payer: Self-pay | Admitting: Obstetrics and Gynecology

## 2020-07-07 NOTE — Patient Outreach (Signed)
Care Coordination  07/07/2020  [place] May 01, 1956 [id number]    Medicaid Managed Care   Unsuccessful Outreach Note  07/07/2020 Name: [name] MRN: [id number] DOB: 1956/01/26  Referred by: [name], MD Reason for referral : High Risk Managed Medicaid (Unsuccessful telephone outreach)   An unsuccessful telephone outreach was attempted today. The patient was referred to the case management team for assistance with care management and care coordination.   Follow Up Plan: The Managed Medicaid  care management team will reach out to the patient again over the next 7 days.   Aida Raider RN, BSN [place]  [employer] - Managed Medicaid High Risk [phone].

## 2020-07-07 NOTE — Patient Instructions (Signed)
Hi [name], I'm sorry we missed you today- as a part of your Medicaid benefit, you are eligible for care management and care coordination services at no cost or copay. I was unable to reach you by phone today but would be happy to help you with your health related needs. Please feel free to call me at [phone].  A member of the Managed Medicaid care management team will reach out to you again over the next 7 days.   Aida Raider RN, BSN [place]  [employer] - Managed Medicaid High Risk [phone]

## 2020-07-14 ENCOUNTER — Ambulatory Visit: Payer: Medicare Other | Admitting: Neurology

## 2020-07-14 ENCOUNTER — Encounter: Payer: Self-pay | Admitting: Neurology

## 2020-07-14 VITALS — BP 136/84 | HR 84 | Ht 71.0 in | Wt 213.0 lb

## 2020-07-14 DIAGNOSIS — G4733 Obstructive sleep apnea (adult) (pediatric): Secondary | ICD-10-CM

## 2020-07-14 DIAGNOSIS — G4739 Other sleep apnea: Secondary | ICD-10-CM

## 2020-07-14 DIAGNOSIS — Z9989 Dependence on other enabling machines and devices: Secondary | ICD-10-CM

## 2020-07-14 DIAGNOSIS — Z789 Other specified health status: Secondary | ICD-10-CM | POA: Diagnosis not present

## 2020-07-14 NOTE — Patient Instructions (Addendum)
Please continue using your ASV regularly. While your insurance requires that you use the machine at least 4 hours each night on 70% of the nights, I recommend, that you not skip any nights and use it throughout the night if you can. Getting used to using a PAP (positive airway pressure) machine and staying with the treatment long term does take time and patience and discipline. Untreated obstructive sleep apnea when it is moderate to severe can have an adverse impact on cardiovascular health and raise her risk for heart disease, arrhythmias, hypertension, congestive heart failure, stroke and diabetes. Untreated obstructive sleep apnea causes sleep disruption, nonrestorative sleep, and sleep deprivation. This can have an impact on your day to day functioning and cause daytime sleepiness and impairment of cognitive function, memory loss, mood disturbance, and problems focussing. Using a PAP machine regularly can improve these symptoms.  As discussed, we will reduce your treatment pressure on your ASV from 10 cm to 8 cm at this time for better tolerance.  Please call us in about a month so we can review another compliance download, we can do this remotely.  Please follow-up in this clinic to see one of our nurse practitioners in about 6 months.

## 2020-07-14 NOTE — Progress Notes (Signed)
Subjective:    Patient ID: [name] is a 65 y.o. male.  HPI    Interim history:   [name] is a 65 year old right-handed gentleman with an underlying medical history of diabetes, hypertension, coronary artery disease with status post four-vessel bypass in 03/2019, CHF, and obesity, who Presents for follow-up consultation of his obstructive sleep apnea after interim testing and starting ASV.  The patient is accompanied by his GF today.  I first met him at the request of his cardiology nurse practitioner on 02/06/2020, at which time he reported snoring and excessive daytime somnolence.  His girlfriend had noticed pauses in his breathing while he was asleep.  He was advised to proceed with sleep testing.  He had a baseline sleep study followed by a titration study.  Baseline sleep study from 02/21/2020 showed a sleep efficiency of 84.6%, sleep latency of 60 minutes, REM sleep was absent.  Total AHI was 33.3/h, average oxygen saturation 92%, nadir was 85%.  The absence of REM sleep likely underestimated his sleep disordered breathing.  He was advised to return for a full night titration study.  He had this on 03/11/2020.  Sleep efficiency was 86.8%, sleep latency 22.5 minutes, REM latency ~12.5 minutes.  He was fitted with a full facemask and started on CPAP therapy and titrated to a pressure of 12 cm.  His AHI was highly elevated at the time including secondary to central apneas.  He was changed to standard BiPAP therapy of 13/9 centimeters and due to ongoing central apneas a backup rate was added.  He was eventually switched to ASV with significant improvement of his sleep disordered breathing.  On the final ASV setting of 12 cm EPAP with minimum pressure support of 3 cm and maximum pressure support of 13 cm his AHI was 0/h, O2 nadir 87% with nonsupine REM sleep achieved.  Based on his test results he was advised to start ASV therapy at home.  His set up date was 04/27/2020.    Today, 07/14/20: I  reviewed his ASV compliance data from the past 30 days from 06/13/2020 through 07/12/2020, during which time he used his machine 24 days with percent use days greater than 4 hours slightly suboptimal at 60%, average usage of 5 hours and 48 minutes for days on treatment, residual AHI at goal at 0.7/h, ASV of 10 cm EPAP.  Leak on the low side with a 95th percentile at 1 L/min.  He called in the interim in early December 2021 requesting a pressure reduction and I reduced his EPAP from 12 cm to 10 cm at the time.  He reports generally using his ASV regularly but he still has some tolerance issues with the pressure and would like to see if we can reduce it.  He has had difficulty using the machine in the beginning as he did not get the right mask.  He did not start using the machine until December 2021.  He did skip some nights.  He has had acid reflux issues for years, he has not seen a GI specialist for this, is scheduled to see GI in April of this year.  He takes Pepcid.  He is wondering if he will need an endoscopy.  He has had issues with regurgitation. He is not quite sure if he has noticed any difference with using ASV but is motivated to continue with treatment.  The patient's allergies, current medications, family history, past medical history, past social history, past surgical history and problem  list were reviewed and updated as appropriate.    Previously:   02/06/20: (He) reports snoring and excessive daytime somnolence.  I reviewed your office note from 01/03/2020.  His Epworth sleepiness score is 5 out of 24, fatigue severity score is 61 out of 63.  His girlfriend has noted pauses in his breathing while he is asleep, but this was more so in the past.  His snoring used to be louder but since his bypass surgery he seems to snore less.  His bedtime is around 9 and rise time around 730.  He has nocturia about once per average night and denies recurrent morning headaches.  He has had pain in both shoulders  and has seen Dr. Maxie Better for this.  He has received shots in both shoulders.  His blood pressure has been trending higher and he was on multiple medications, lately, with recent medication changes, his blood pressure has improved.  He has no family history of sleep apnea as far as he knows.  He is single and lives alone.  He has no pets.  He has no children.  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  He is experiencing fatigue and shortness of breath with minimal exertion.  He has not had cardiac rehab yet, it was postponed.   His Past Medical History Is Significant For: Past Medical History:  Diagnosis Date  . CAD (coronary artery disease) 03/03/2019  . Hypertension   . Type 2 diabetes mellitus with diabetic nephropathy ([place]) 04/23/2019    His Past Surgical History Is Significant For: Past Surgical History:  Procedure Laterality Date  . BIOPSY  06/27/2019   Procedure: BIOPSY;  Surgeon: Rogene Houston, MD;  [facility];  Service: Endoscopy;;  ascending colon polyp  . CARDIAC SURGERY    . COLONOSCOPY N/A 06/27/2019   Procedure: COLONOSCOPY;  Surgeon: Rogene Houston, MD;  [facility];  Service: Endoscopy;  Laterality: N/A;  1030am-office rescheduled to 06/27/19 '@9' :55am  . CORONARY ARTERY BYPASS GRAFT N/A 03/25/2019   Procedure: CORONARY ARTERY BYPASS GRAFTING (CABG) X  4 USING LEFT INTERNAL MAMMARY ARTERY AND RIGHT SAPHENOUS VEIN GRAFTS;  Surgeon: [name], MD;  Location: [facility];  Service: Open Heart Surgery;  Laterality: N/A;  . EXCISION MORTON'S NEUROMA    . POLYPECTOMY  06/27/2019   Procedure: POLYPECTOMY;  Surgeon: Rogene Houston, MD;  [facility];  Service: Endoscopy;;  proximal sigmoid colon  . RIGHT/LEFT HEART CATH AND CORONARY ANGIOGRAPHY N/A 03/12/2019   Procedure: RIGHT/LEFT HEART CATH AND CORONARY ANGIOGRAPHY;  Surgeon: [name], MD;  Location: [facility];  Service: Cardiovascular;  Laterality: N/A;   . TEE WITHOUT CARDIOVERSION N/A 03/25/2019   Procedure: TRANSESOPHAGEAL ECHOCARDIOGRAM (TEE);  Surgeon: [name], MD;  Location: [facility];  Service: Open Heart Surgery;  Laterality: N/A;    His Family History Is Significant For: Family History  Problem Relation Age of Onset  . Colon cancer Mother   . Heart Problems Father   . Diabetes Father   . Valvular heart disease Father     His Social History Is Significant For: Social History   Socioeconomic History  . Marital status: Single    Spouse name: Not on file  . Number of children: Not on file  . Years of education: Not on file  . Highest education level: Not on file  Occupational History  . Not on file  Tobacco Use  .  Smoking status: Former Smoker    Quit date: 07/06/1988    Years since quitting: 32.0  . Smokeless tobacco: Never Used  Vaping Use  . Vaping Use: Never used  Substance and Sexual Activity  . Alcohol use: Not Currently    Comment: socially  . Drug use: No  . Sexual activity: Not on file  Other Topics Concern  . Not on file  Social History Narrative  . Not on file   Social Determinants of Health   Financial Resource Strain: Not on file  Food Insecurity: Not on file  Transportation Needs: Not on file  Physical Activity: Not on file  Stress: Not on file  Social Connections: Not on file    His Allergies Are:  Allergies  Allergen Reactions  . Bee Venom Anaphylaxis  . Atorvastatin     myalgia  . Rosuvastatin     myalgias  :   His Current Medications Are:  Outpatient Encounter Medications as of 07/14/2020  Medication Sig  . acetaminophen (TYLENOL) 500 MG tablet Take 1,000 mg by mouth every 6 (six) hours as needed for moderate pain or headache.   . albuterol (VENTOLIN HFA) 108 (90 Base) MCG/ACT inhaler Inhale 2 puffs into the lungs every 6 (six) hours as needed.  Marland Kitchen amLODipine (NORVASC) 5 MG tablet Take 5 mg by mouth daily.  Marland Kitchen aspirin EC 81 MG tablet Take 81 mg by mouth daily.  .  Cholecalciferol (VITAMIN D-3) 125 MCG (5000 UT) TABS Take 5,000 Units by mouth daily.  . Coenzyme Q10 (COQ10 PO) Take 1 tablet by mouth daily.  . Cyanocobalamin (B-12) 2500 MCG TABS Take 2,500 mcg by mouth daily.  Marland Kitchen dicyclomine (BENTYL) 20 MG tablet Take 1 tablet (20 mg total) by mouth 2 (two) times daily.  . diphenhydramine-acetaminophen (TYLENOL PM) 25-500 MG TABS tablet Take 1 tablet by mouth at bedtime as needed.  . doxazosin (CARDURA) 4 MG tablet Take 4 mg by mouth as directed. TAKE 1/2 TABLET AT BEDTIME  . Evolocumab (REPATHA SURECLICK) 932 MG/ML SOAJ Inject 140 mg into the skin every 14 (fourteen) days.  . famotidine (PEPCID) 20 MG tablet Take 1 tablet (20 mg total) by mouth 2 (two) times daily.  . furosemide (LASIX) 40 MG tablet TAKE 1 AND 1/2 TABLETS BY MOUTH DAILY (Patient taking differently: TAKE 1 TABLET BY MOUTH DAILY)  . HYDROcodone-acetaminophen (NORCO/VICODIN) 5-325 MG tablet TAKE ONE (1) TABLET BY MOUTH EVERY 6 HOURS AS NEEDED FOR MODERATE PAIN  . insulin glargine (LANTUS SOLOSTAR) 100 UNIT/ML Solostar Pen Inject 25 Units into the skin every morning. (Patient taking differently: Inject 18 Units into the skin every morning.)  . Insulin Pen Needle (B-D ULTRAFINE III SHORT PEN) 31G X 8 MM MISC 1 each by Does not apply route as directed.  . Magnesium 100 MG CAPS Take 1 capsule by mouth daily.  . Multiple Vitamin (MULTIVITAMIN WITH MINERALS) TABS tablet Take 1 tablet by mouth daily.  . ondansetron (ZOFRAN ODT) 4 MG disintegrating tablet Take 1 tablet (4 mg total) by mouth every 8 (eight) hours as needed for nausea or vomiting.  . Semaglutide (OZEMPIC, 0.25 OR 0.5 MG/DOSE, [place]) Inject into the skin. Once a week injection  . sertraline (ZOLOFT) 100 MG tablet Take 1 tablet (100 mg total) by mouth daily.  . valsartan (DIOVAN) 80 MG tablet Take 1 tablet (80 mg total) by mouth daily.   No facility-administered encounter medications on file as of 07/14/2020.  :  Review of Systems:  Out of a  complete 14 point review of systems, all are reviewed and negative with the exception of these symptoms as listed below: Review of Systems  Neurological:       Here for f/u on AVS. Pt reports he has been doing ok with machine. Reports he does not use the machine every night due to acid reflux symptoms.     Objective:  Neurological Exam  Physical Exam Physical Examination:   Vitals:   07/14/20 1307  BP: 136/84  Pulse: 84  SpO2: 94%   General Examination: The patient is a very pleasant 65 y.o. male in no acute distress. He appears well-developed and well-nourished and well groomed.   HEENT: Normocephalic, atraumatic, pupils are equal, round and reactive to light, extraocular tracking is well preserved.  Hearing is grossly intact, right eyelid droopiness, seems stable.  Face is symmetric with normal facial animation otherwise.  Airway examination reveals mild to moderate mouth dryness, adequate dental hygiene and moderate airway crowding.    Chest: Clear to auscultation without wheezing, rhonchi or crackles noted.  Heart: S1+S2+0, regular and normal without murmurs, rubs or gallops noted.   Abdomen: Soft, non-tender and non-distended.  Extremities: There is no pitting edema in the distal lower extremities bilaterally.   Skin: Warm and dry without trophic changes noted.   Musculoskeletal: exam reveals decreased mobility in both shoulders.     Neurologically:  Mental status: The patient is awake, alert and oriented in all 4 spheres. His immediate and remote memory, attention, language skills and fund of knowledge are appropriate. There is no evidence of aphasia, agnosia, apraxia or anomia. Speech is clear with normal prosody and enunciation. Thought process is linear. Mood is normal and affect is normal.  Cranial nerves II - XII are as described above under HEENT exam.  Motor exam: Normal bulk, strength and tone is noted. There is no tremor, fine motor skills and coordination:  grossly intact.  Cerebellar testing: No dysmetria or intention tremor. There is no truncal or gait ataxia.  Sensory exam: intact to light touch in the upper and lower extremities.   Assessment and Plan:   In summary, ZIMERE DUNLEVY is a very pleasant 65 year old male with an underlying medical history of diabetes, hypertension, coronary artery disease with status post four-vessel bypass, CHF, and obesity, who presents for follow-up consultation of his obstructive sleep apnea after interim testing and starting ASV.  He had a baseline sleep study on 02/21/2020 which showed severe obstructive sleep apnea with an AHI of 33.3/h, O2 nadir of 85%, absence of REM sleep.  He had a difficult titration study on 03/11/2020, inadequate resolution of his sleep disordered breathing with CPAP or standard BiPAP therapy and evidence of significant central apneas.  These were not alleviated with BiPAP ST and eventually he had resolution of his sleep apnea with ASV.  He is compliant with the usage but has skipped a few nights.  He has had some trouble tolerating the pressure and we reduce the pressure from 12 cm EPAP to 10 cm in December.  We can try to reduce it further to 8 cm at this time, apnea score is at goal.  He does have some reflux issues also which have prevented some nights of usage.  He is advised to continue to work on full compliance.  He has an appointment with GI pending in April.  He is advised to follow-up in this clinic routinely in 6 months and I would like to review his compliance data in about  1 month from now.  He is agreeable to the pressure reduction and advised to call our office in about 1 month so we can review another 30-day download.  He is advised to call us with any interim questions or concerns and follow-up to see one of our nurse practitioners routinely in 6 months.  I answered all their questions today and the patient and his girlfriend were in agreement.   I spent 30 minutes in total  face-to-face time and in reviewing records during pre-charting, more than 50% of which was spent in counseling and coordination of care, reviewing test results, reviewing medications and treatment regimen and/or in discussing or reviewing the diagnosis of OSA, CSA, the prognosis and treatment options. Pertinent laboratory and imaging test results that were available during this visit with the patient were reviewed by me and considered in my medical decision making (see chart for details).

## 2020-07-16 LAB — HEMOGLOBIN A1C: Hemoglobin A1C: NORMAL

## 2020-07-21 ENCOUNTER — Other Ambulatory Visit: Payer: Self-pay

## 2020-07-21 ENCOUNTER — Ambulatory Visit (INDEPENDENT_AMBULATORY_CARE_PROVIDER_SITE_OTHER): Payer: Medicare Other | Admitting: Pharmacist Clinician (PhC)/ Clinical Pharmacy Specialist

## 2020-07-21 ENCOUNTER — Encounter: Payer: Self-pay | Admitting: Family Medicine

## 2020-07-21 DIAGNOSIS — I2510 Atherosclerotic heart disease of native coronary artery without angina pectoris: Secondary | ICD-10-CM

## 2020-07-21 DIAGNOSIS — I10 Essential (primary) hypertension: Secondary | ICD-10-CM

## 2020-07-21 MED ORDER — AMLODIPINE BESYLATE 10 MG PO TABS: 10.0000 mg | ORAL_TABLET | Freq: Every day | ORAL | 3 refills | Status: DC

## 2020-07-21 NOTE — Patient Instructions (Signed)
Return for a a follow up appointment March 22 at 1:30  Check your blood pressure at home daily (if able) and keep record of the readings.  Take your BP meds as follows:  Increase amlodipine to 10 mg daily.  If this causes you to have any problems with edema, please give [place] a call at [phone] ([place]lin Eppinger/[place])   Continue with all other medications  Bring all of your meds, your BP cuff and your record of home blood pressures to your next appointment.  Exercise as you're able, try to walk approximately 30 minutes per day.  Keep salt intake to a minimum, especially watch canned and prepared boxed foods.  Eat more fresh fruits and vegetables and fewer canned items.  Avoid eating in fast food restaurants.    HOW TO TAKE YOUR BLOOD PRESSURE: . Rest 5 minutes before taking your blood pressure. .  Don't smoke or drink caffeinated beverages for at least 30 minutes before. . Take your blood pressure before (not after) you eat. . Sit comfortably with your back supported and both feet on the floor (don't cross your legs). . Elevate your arm to heart level on a table or a desk. . Use the proper sized cuff. It should fit smoothly and snugly around your bare upper arm. There should be enough room to slip a fingertip under the cuff. The bottom edge of the cuff should be 1 inch above the crease of the elbow. . Ideally, take 3 measurements at one sitting and record the average.

## 2020-07-21 NOTE — Assessment & Plan Note (Signed)
Patient with essential hypertension, currently not at goal.  Most recent metabolic panel shows SCr increase from 2.72 to 3.37.  He states that nephrologist is aware of this increase, but not concerned.  This does prevent [place] from increasing valsartan to lower his pressure.  Instead will increase amlodipine from 5 mg to 10 mg once daily.  Patient will need to continue with home BP monitoring and we will see him back in the office in 6 weeks for follow up.

## 2020-07-21 NOTE — Progress Notes (Signed)
HPI:  [name] is a 65 y.o. male patient of Dr. Oval Linsey , with a PMH below who presents today for follow up in the advanced hypertension clinic.   He was not on any BP medications, as he stated everything tried had made him feel fatigued (see list below).  He was also unable to tolerate statins due to myalgias.   His main concern has been exertional dyspnea, which has not improved.  Patient was recently diagnosed with obstructive sleep apnea, and insufficient treatment with CPAP. Current recommendation is to initiate ASV (adaptive servo ventilation) and he is waiting for device delivery. Valsartan 80mg  daily was initiated at a previous visit with HTN clinic and patient is very happy with results. Denies problems with current therapy.  He was last seen by Dr. Oval Linsey in January, when his pressure was still elevated at 166/94.  He has been as low as 134/82 at his appointment prior to that.  He has had some problems with orthostatic hypotension.  She decreased the doxazosin from 4 mg to 2 mg each night and asked that he continue with follow up visits.  Today he returns for follow up.  He notes that the orthostatic episodes have decreased significantly since cutting back on the doxazosin.  Still has a few episodes, but not nearly as bad.  States home BP readings mostly in the 130/90's, although he doesn't have any readings with him today.  He is currently undergoing eye injections and reports has had some problems with bleeding in the arteries of his left eye.  We want to be sure to keep his pressure low enough to reduce the risk of this occurring.  He is also experiencing problems with his shoulder and will be setting up a date for rotator cuff repair with Dr. Tonita Cong at [facility] in the near future.    Click here for Secondary HTN Screening Tool (Then Refresh Note) Secondary Cause Data Comments  Herbals/Drugs  Screened:  01/20/2020 Not taking         Renovascular HTN             Sleep  Apnea  Screened:  01/20/2020 No History Never evaluated  Patient denies snoring, states sleeps easily w/o concer  Hyperthyroidism  Screened:  04/29/2018 No History Previously ruled out  TSH 2.11  Hypothyroidism   Screened:  04/29/2018 No History Previously ruled out   TSH 2.11  Hyperaldosteronism             Pheochromocytoma  Screened:  01/20/2020 No History    Testing not indicated  Cushing's Syndrome  Screened:  01/20/2020 No History     Testing not indicated  Hyperparathyroidism             Coarctation of Aorta            Chemotherapeutic Agents        Compliance               Past Medical History: ASCVD S/p CABG x 4, PAD -   CHF Chronic systolic and diastolic 2/77 echo EF at 41-28% up from 78-67%, grade 1 diastolic dysfunction  hyperlipidemia 7/21:  TC 265, TG 234, HDL 44, LDL 177 - no meds  DM2 10/20: A1c 8.2 - glipizide 2.5 mg qd, Lantus qd  CKD3 7/21:  SCr 2.14, GFR 32 CrCl 50     Blood Pressure Goal:  130/80  Current Medications: amlodipine 5 mg doxazosin 2 mg qhs  furosemide 40mg  daily  Valsartan 80mg  daily  Intolerances: lisinopril, hctz, beta blockers, hydralazine  Family Hx: mother had colon cancer, died at 40, no heart disease; father died at 31's, valve, CHF  Social Hx: former smoker (quit 25+ yrs ago, 2 ppd x 10-15 yrs); no alcohol  Diet: no processed food; more chicken, eggs; some vegetables; increased protein, no snacks  Exercise: limited by DOE, was given information on PREP  Home BP readings:  17 readings since 9/20, average 137/92, HR range 82-96bpm   Labs: 7/21:  Na 139, K 4.3, Glu 142, BUN 29, SCr 2.17, GFR 32  12/21:  Na 136, K 4.3, Glu 169, BUN 48, SCr 3.37, GFR 20  Wt Readings from Last 3 Encounters:  07/21/20 215 lb 9.6 oz (97.8 kg)  07/14/20 213 lb (96.6 kg)  06/17/20 211 lb 3.2 oz (95.8 kg)   BP Readings from Last 3 Encounters:  07/21/20 (!) 164/92  07/14/20 136/84  06/17/20 (!) 166/94   Pulse Readings from Last 3  Encounters:  07/21/20 93  07/14/20 84  06/17/20 82    Current Outpatient Medications  Medication Sig Dispense Refill  . acetaminophen (TYLENOL) 500 MG tablet Take 1,000 mg by mouth every 6 (six) hours as needed for moderate pain or headache.     . albuterol (VENTOLIN HFA) 108 (90 Base) MCG/ACT inhaler Inhale 2 puffs into the lungs every 6 (six) hours as needed. 18 g 5  . amLODipine (NORVASC) 10 MG tablet Take 1 tablet (10 mg total) by mouth daily. 90 tablet 3  . aspirin EC 81 MG tablet Take 81 mg by mouth daily.    . Cholecalciferol (VITAMIN D-3) 125 MCG (5000 UT) TABS Take 5,000 Units by mouth daily.    . Coenzyme Q10 (COQ10 PO) Take 1 tablet by mouth daily.    . Cyanocobalamin (B-12) 2500 MCG TABS Take 2,500 mcg by mouth daily.    . diphenhydramine-acetaminophen (TYLENOL PM) 25-500 MG TABS tablet Take 1 tablet by mouth at bedtime as needed.    . doxazosin (CARDURA) 4 MG tablet Take 4 mg by mouth as directed. TAKE 1/2 TABLET AT BEDTIME    . Evolocumab (REPATHA SURECLICK) 700 MG/ML SOAJ Inject 140 mg into the skin every 14 (fourteen) days. 2 mL 11  . famotidine (PEPCID) 20 MG tablet Take 1 tablet (20 mg total) by mouth 2 (two) times daily. 30 tablet 0  . furosemide (LASIX) 40 MG tablet TAKE 1 AND 1/2 TABLETS BY MOUTH DAILY (Patient taking differently: TAKE 1 TABLET BY MOUTH DAILY) 135 tablet 1  . HYDROcodone-acetaminophen (NORCO/VICODIN) 5-325 MG tablet TAKE ONE (1) TABLET BY MOUTH EVERY 6 HOURS AS NEEDED FOR MODERATE PAIN 20 tablet 0  . insulin glargine (LANTUS SOLOSTAR) 100 UNIT/ML Solostar Pen Inject 25 Units into the skin every morning. (Patient taking differently: Inject 18 Units into the skin every morning.) 5 pen PRN  . Insulin Pen Needle (B-D ULTRAFINE III SHORT PEN) 31G X 8 MM MISC 1 each by Does not apply route as directed. 100 each 3  . Magnesium 100 MG CAPS Take 1 capsule by mouth daily.    . Multiple Vitamin (MULTIVITAMIN WITH MINERALS) TABS tablet Take 1 tablet by mouth daily.     . ondansetron (ZOFRAN ODT) 4 MG disintegrating tablet Take 1 tablet (4 mg total) by mouth every 8 (eight) hours as needed for nausea or vomiting. 20 tablet 0  . Semaglutide (OZEMPIC, 0.25 OR 0.5 MG/DOSE, [place]) Inject into the skin. Once a week injection    .  sertraline (ZOLOFT) 100 MG tablet Take 1 tablet (100 mg total) by mouth daily. 90 tablet 1  . valsartan (DIOVAN) 80 MG tablet Take 1 tablet (80 mg total) by mouth daily. 30 tablet 6   No current facility-administered medications for this visit.    Allergies  Allergen Reactions  . Bee Venom Anaphylaxis  . Atorvastatin     myalgia  . Rosuvastatin     myalgias    Past Medical History:  Diagnosis Date  . CAD (coronary artery disease) 03/03/2019  . Hypertension   . Type 2 diabetes mellitus with diabetic nephropathy ([place]) 04/23/2019    Blood pressure (!) 164/92, pulse 93, resp. rate 16, height 5\' 10"  (1.778 m), weight 215 lb 9.6 oz (97.8 kg), SpO2 95 %.  Essential hypertension, benign Patient with essential hypertension, currently not at goal.  Most recent metabolic panel shows SCr increase from 2.72 to 3.37.  He states that nephrologist is aware of this increase, but not concerned.  This does prevent [place] from increasing valsartan to lower his pressure.  Instead will increase amlodipine from 5 mg to 10 mg once daily.  Patient will need to continue with home BP monitoring and we will see him back in the office in 6 weeks for follow up.   Tommy Medal PharmD CPP [facility] [address] [place] [address] 07/21/2020 3:37 PM

## 2020-07-22 ENCOUNTER — Other Ambulatory Visit: Payer: Self-pay | Admitting: Cardiovascular Disease

## 2020-07-22 ENCOUNTER — Telehealth: Payer: Self-pay | Admitting: Cardiovascular Disease

## 2020-07-22 NOTE — Telephone Encounter (Signed)
Pt c/o medication issue:  1. Name of Medication: amLODipine (NORVASC) 10 MG tablet  2. How are you currently taking this medication (dosage and times per day)? N/a   3. Are you having a reaction (difficulty breathing--STAT)? N/a   4. What is your medication issue?   Sherren Mocha, pharmacist with [facility], is requesting to verify medication increase from 5 MG to 10 MG.  Please return call to [facility] to confirm at [phone].

## 2020-07-22 NOTE — Telephone Encounter (Signed)
Spoke with pharmacy staff. Advised amlodipine was increased during 07/21/20 HTN pharmacy visit

## 2020-07-28 ENCOUNTER — Other Ambulatory Visit: Payer: Self-pay | Admitting: Cardiovascular Disease

## 2020-07-30 ENCOUNTER — Other Ambulatory Visit: Payer: Self-pay

## 2020-07-30 ENCOUNTER — Telehealth: Payer: Self-pay | Admitting: Cardiovascular Disease

## 2020-07-30 MED ORDER — DOXAZOSIN MESYLATE 2 MG PO TABS: 2.0000 mg | ORAL_TABLET | Freq: Every day | ORAL | 3 refills | Status: DC

## 2020-07-30 NOTE — Progress Notes (Signed)
Spoke with pt's wife while at the pharmacy. Wife states that pharmacy has refused to refill prescription. Sent in refill with change in dosage from most recent office visit with Dr. Oval Linsey. Wife verbalizes understanding and is thankful.

## 2020-07-30 NOTE — Telephone Encounter (Signed)
° °  No need phone note. Transferred call to triage

## 2020-08-12 ENCOUNTER — Ambulatory Visit: Payer: Self-pay | Admitting: Orthopedic Surgery

## 2020-08-13 ENCOUNTER — Encounter (HOSPITAL_COMMUNITY): Payer: Self-pay

## 2020-08-13 ENCOUNTER — Other Ambulatory Visit: Payer: Self-pay

## 2020-08-13 ENCOUNTER — Encounter (HOSPITAL_COMMUNITY)
Admission: RE | Admit: 2020-08-13 | Discharge: 2020-08-13 | Disposition: A | Discharge status: Home / Self Care | Payer: Medicare HMO | Source: Ambulatory Visit | Attending: Nephrology | Admitting: Nephrology

## 2020-08-13 DIAGNOSIS — D631 Anemia in chronic kidney disease: Secondary | ICD-10-CM | POA: Diagnosis not present

## 2020-08-13 DIAGNOSIS — N189 Chronic kidney disease, unspecified: Secondary | ICD-10-CM | POA: Insufficient documentation

## 2020-08-13 MED ORDER — SODIUM CHLORIDE 0.9 % IV SOLN: Freq: Once | INTRAVENOUS | Status: AC

## 2020-08-13 MED ORDER — SODIUM CHLORIDE 0.9 % IV SOLN
510.0000 mg | Freq: Once | INTRAVENOUS | Status: AC
  Administered 2020-08-13: 510 mg via INTRAVENOUS
  Filled 2020-08-13: qty 510

## 2020-08-17 ENCOUNTER — Ambulatory Visit: Payer: Self-pay | Admitting: Orthopedic Surgery

## 2020-08-17 NOTE — H&P (View-Only) (Signed)
[name] is an 65 y.o. male.   Chief Complaint: R shoulder pain HPI: Reason for Visit: Diagnositc Results (bilateral shoulder MRIs) Location (Upper Extremity): shoulder pain bilateral Quality: decreased range of motion Aggravating Factors: pushing/pulling ; ROM Alleviating Factors: rest Associated Symptoms: popping/clicking Medications: The patient is taking Tylenol  Past Medical History:  Diagnosis Date  . CAD (coronary artery disease) 03/03/2019  . Hypertension   . Type 2 diabetes mellitus with diabetic nephropathy ([place]) 04/23/2019    Past Surgical History:  Procedure Laterality Date  . BIOPSY  06/27/2019   Procedure: BIOPSY;  Surgeon: Rogene Houston, MD;  [facility]: [facility];  Service: Endoscopy;;  ascending colon polyp  . CARDIAC SURGERY    . COLONOSCOPY N/A 06/27/2019   Procedure: COLONOSCOPY;  Surgeon: Rogene Houston, MD;  [facility]: [facility];  Service: Endoscopy;  Laterality: N/A;  1030am-office rescheduled to 06/27/19 _0 :55am  . CORONARY ARTERY BYPASS GRAFT N/A 03/25/2019   Procedure: CORONARY ARTERY BYPASS GRAFTING (CABG) X  4 USING LEFT INTERNAL MAMMARY ARTERY AND RIGHT SAPHENOUS VEIN GRAFTS;  Surgeon: [name], MD;  Location: [facility];  Service: Open Heart Surgery;  Laterality: N/A;  . EXCISION MORTON'S NEUROMA    . POLYPECTOMY  06/27/2019   Procedure: POLYPECTOMY;  Surgeon: Rogene Houston, MD;  [facility];  Service: Endoscopy;;  proximal sigmoid colon  . RIGHT/LEFT HEART CATH AND CORONARY ANGIOGRAPHY N/A 03/12/2019   Procedure: RIGHT/LEFT HEART CATH AND CORONARY ANGIOGRAPHY;  Surgeon: [name], MD;  Location: [facility];  Service: Cardiovascular;  Laterality: N/A;  . TEE WITHOUT CARDIOVERSION N/A 03/25/2019   Procedure: TRANSESOPHAGEAL ECHOCARDIOGRAM (TEE);  Surgeon: [name], MD;  Location: [facility];  Service: Open Heart Surgery;  Laterality: N/A;    Family History  Problem Relation Age of Onset  .  Colon cancer Mother   . Heart Problems Father   . Diabetes Father   . Valvular heart disease Father    Social History:  reports that he quit smoking about 32 years ago. He has never used smokeless tobacco. He reports previous alcohol use. He reports that he does not use drugs.  Allergies:  Allergies  Allergen Reactions  . Bee Venom Anaphylaxis  . Atorvastatin     myalgia  . Rosuvastatin     myalgias   Medications Accu-Chek Fastclix Lancet Drum Accu-Chek [employer] Device kit Accu-Chek Guide L1-L2 Control Solution Accu-Chek Guide Me Glucose Meter Accu-Chek Guide test strips doxazosin 4 mg tablet Easy Comfort Pen Needles 31 gauge x 5/16" furosemide 40 mg tablet glipiZIDE 10 mg tablet hydrALAZINE 25 mg tablet HYDROcodone 5 mg-acetaminophen 325 mg tablet Lantus Solostar U-100 Insulin 100 unit/mL (3 mL) subcutaneous pen ProAir HFA 90 mcg/actuation aerosol inhaler sertraline 100 mg tablet sertraline 50 mg tablet valsartan 80 mg tablet  Review of Systems  Constitutional: Negative.   HENT: Negative.   Eyes: Negative.   Respiratory: Negative.   Cardiovascular: Negative.   Gastrointestinal: Negative.   Endocrine: Negative.   Genitourinary: Negative.   Musculoskeletal: Positive for arthralgias.  Skin: Negative.   Neurological: Negative.   Psychiatric/Behavioral: Negative.     There were no vitals taken for this visit. Physical Exam Constitutional:      Appearance: Normal appearance.  HENT:     Head: Normocephalic.     Right Ear: External ear normal.     Left Ear: External ear normal.     Nose: Nose normal.     Mouth/Throat:  Pharynx: Oropharynx is clear.  Eyes:     Conjunctiva/sclera: Conjunctivae normal.  Cardiovascular:     Rate and Rhythm: Normal rate and regular rhythm.     Pulses: Normal pulses.  Pulmonary:     Effort: Pulmonary effort is normal.     Breath sounds: Normal breath sounds.  Abdominal:     General: Bowel sounds are normal.   Musculoskeletal:     Cervical back: Normal range of motion.     Comments: Patient is a 65 year old male.  Constitutional: General Appearance: healthy-appearing and NAD.  Psychiatric: Mood and Affect: normal mood and affect.  Cardiovascular System: Arterial Pulses Right: radial normal and brachial normal. Varicosities Right: no varicosities.  C-Spine/Neck: Active Range of Motion: flexion normal, extension normal, and no pain elicited on motion.  Shoulders: Inspection Right: no misalignment, atrophy, erythema, swelling, or scapular winging. Bony Palpation Right: no tenderness of the sternoclavicular joint, the coracoid process, the acromioclavicular joint, the bicipital groove, or the scapula. Soft Tissue Palpation Right: tenderness of the supraspinatus and the subacromial bursa. Active Range of Motion Right: limited. Special Tests Right: Speed's test negative and Neer's test positive. Stability Right: no laxity, sulcus sign negative, and anterior apprehension test negative. Strength Right: abduction 5/5, adduction 5/5, flexion 5/5, and extension 5/5.  Skin: Right Upper Extremity: normal.  Neurological System: Biceps Reflex Right: normal (2). Brachioradialis Reflex Right: normal (2). Triceps Reflex Right: normal (2). Sensation on the Right: C5 normal, C6 normal, and C7 normal.  Positive impingement sign left shoulder  Skin:    General: Skin is warm and dry.  Neurological:     Mental Status: He is alert.    MRI of the right shoulder demonstrates essentially a full-thickness tear or high-grade tear of the insertional part of the supraspinatus. Some tearing of the subscap tendinosis of the biceps small glenohumeral effusion.  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  Plan:  We proceeded with a second  injection on his left shoulder I had extensive discussion with the patient concerning their pathology and relevant anatomy. Specific to the shoulder to avoid impingement positions during their activities of daily living as well as during exercise. Avoiding for example abduction to 90. Avoiding wide-based bench pressing. Avoiding repetitive circular motions of the upper extremity. Minimize direct loading to the joints such as with dips as well as side planks. In addition we discussed avoiding reaching backwards such as in a automobile to the back seat. Exercises to strengthen the rotator cuff and shoulder girdle were discussed as well. Appropriate follow-up was discussed as well including indications for injections as well as further or repeat imaging of the shoulder.  Given that his right is bothering him significantly he cannot lift his arm up above his head can participate in cardiac rehab we discussed a repair of the rotator cuff An extensive discussion concerning the pathology relevant anatomy and treatment options. After that discussion we mutually agreed to proceed with repair of the rotator cuff utilizing arthroscopic assistance if possible. The risks and benefits of that procedure were discussed including bleeding, infection, suboptimal range of motion, deep venous thrombosis, pulmonary embolism, anesthetic complications etc. in addition we discussed the postoperative course to include approximately 4 weeks of passive range of motion followed by 4 weeks of active range of motion followed by 4-12 weeks of progressive strengthening exercises. In addition we discussed protective activities  to reduce the risk of a reinjury including impingement activities with elbow above the shoulder as well as reaching and repetitive circular motion activities. The hospital stay will either be as a outpatient with a regional block versus overnight depending upon the extent of the procedure and any challenging health  issues with a first postoperative visit 2 weeks following the surgery. Will need preoperative clearance that may delay [place]. We discussed similar procedure on the left at some point but is more symptomatic on the right  Plan right shoulder mini-open RCR, SAD  Cecilie Kicks, PA-C for Dr. Tonita Cong 08/17/2020, 10:52 AM

## 2020-08-17 NOTE — H&P (Signed)
[name] is an 64 y.o. male.   Chief Complaint: R shoulder pain HPI: Reason for Visit: Diagnositc Results (bilateral shoulder MRIs) Location (Upper Extremity): shoulder pain bilateral Quality: decreased range of motion Aggravating Factors: pushing/pulling ; ROM Alleviating Factors: rest Associated Symptoms: popping/clicking Medications: The patient is taking Tylenol  Past Medical History:  Diagnosis Date  . CAD (coronary artery disease) 03/03/2019  . Hypertension   . 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    Past Surgical History:  Procedure Laterality Date  . BIOPSY  06/27/2019   Procedure: BIOPSY;  Surgeon: [name], Najeeb U, MD;  [facility];  Service: Endoscopy;;  ascending colon polyp  . CARDIAC SURGERY    . COLONOSCOPY N/A 06/27/2019   Procedure: COLONOSCOPY;  Surgeon: [name], Najeeb U, MD;  [facility];  Service: Endoscopy;  Laterality: N/A;  1030am-office rescheduled to 06/27/19 @9:55am  . CORONARY ARTERY BYPASS GRAFT N/A 03/25/2019   Procedure: CORONARY ARTERY BYPASS GRAFTING (CABG) X  4 USING LEFT INTERNAL MAMMARY ARTERY AND RIGHT SAPHENOUS VEIN GRAFTS;  Surgeon: Lightfoot, Harrell O, MD;  Location: [facility];  Service: Open Heart Surgery;  Laterality: N/A;  . EXCISION MORTON'S NEUROMA    . POLYPECTOMY  06/27/2019   Procedure: POLYPECTOMY;  Surgeon: [name], Najeeb U, MD;  [facility];  Service: Endoscopy;;  proximal sigmoid colon  . RIGHT/LEFT HEART CATH AND CORONARY ANGIOGRAPHY N/A 03/12/2019   Procedure: RIGHT/LEFT HEART CATH AND CORONARY ANGIOGRAPHY;  Surgeon: Varanasi, Jayadeep S, MD;  Location: [facility];  Service: Cardiovascular;  Laterality: N/A;  . TEE WITHOUT CARDIOVERSION N/A 03/25/2019   Procedure: TRANSESOPHAGEAL ECHOCARDIOGRAM (TEE);  Surgeon: Lightfoot, Harrell O, MD;  Location: [facility];  Service: Open Heart Surgery;  Laterality: N/A;    Family History  Problem Relation Age of Onset  .  Colon cancer Mother   . Heart Problems Father   . Diabetes Father   . Valvular heart disease Father    Social History:  reports that he quit smoking about 32 years ago. He has never used smokeless tobacco. He reports previous alcohol use. He reports that he does not use drugs.  Allergies:  Allergies  Allergen Reactions  . Bee Venom Anaphylaxis  . Atorvastatin     myalgia  . Rosuvastatin     myalgias   Medications Accu-Chek Fastclix Lancet Drum Accu-Chek FastClix Lancing Device kit Accu-Chek Guide L1-L2 Control Solution Accu-Chek Guide Me Glucose Meter Accu-Chek Guide test strips doxazosin 4 mg tablet Easy Comfort Pen Needles 31 gauge x 5/16" furosemide 40 mg tablet glipiZIDE 10 mg tablet hydrALAZINE 25 mg tablet HYDROcodone 5 mg-acetaminophen 325 mg tablet Lantus Solostar U-100 Insulin 100 unit/mL (3 mL) subcutaneous pen ProAir HFA 90 mcg/actuation aerosol inhaler sertraline 100 mg tablet sertraline 50 mg tablet valsartan 80 mg tablet  Review of Systems  Constitutional: Negative.   HENT: Negative.   Eyes: Negative.   Respiratory: Negative.   Cardiovascular: Negative.   Gastrointestinal: Negative.   Endocrine: Negative.   Genitourinary: Negative.   Musculoskeletal: Positive for arthralgias.  Skin: Negative.   Neurological: Negative.   Psychiatric/Behavioral: Negative.     There were no vitals taken for this visit. Physical Exam Constitutional:      Appearance: Normal appearance.  HENT:     Head: Normocephalic.     Right Ear: External ear normal.     Left Ear: External ear normal.     Nose: Nose normal.     Mouth/Throat:       Pharynx: Oropharynx is clear.  Eyes:     Conjunctiva/sclera: Conjunctivae normal.  Cardiovascular:     Rate and Rhythm: Normal rate and regular rhythm.     Pulses: Normal pulses.  Pulmonary:     Effort: Pulmonary effort is normal.     Breath sounds: Normal breath sounds.  Abdominal:     General: Bowel sounds are normal.   Musculoskeletal:     Cervical back: Normal range of motion.     Comments: Patient is a 64-year-old male.  Constitutional: General Appearance: healthy-appearing and NAD.  Psychiatric: Mood and Affect: normal mood and affect.  Cardiovascular System: Arterial Pulses Right: radial normal and brachial normal. Varicosities Right: no varicosities.  C-Spine/Neck: Active Range of Motion: flexion normal, extension normal, and no pain elicited on motion.  Shoulders: Inspection Right: no misalignment, atrophy, erythema, swelling, or scapular winging. Bony Palpation Right: no tenderness of the sternoclavicular joint, the coracoid process, the acromioclavicular joint, the bicipital groove, or the scapula. Soft Tissue Palpation Right: tenderness of the supraspinatus and the subacromial bursa. Active Range of Motion Right: limited. Special Tests Right: Speed's test negative and Neer's test positive. Stability Right: no laxity, sulcus sign negative, and anterior apprehension test negative. Strength Right: abduction 5/5, adduction 5/5, flexion 5/5, and extension 5/5.  Skin: Right Upper Extremity: normal.  Neurological System: Biceps Reflex Right: normal (2). Brachioradialis Reflex Right: normal (2). Triceps Reflex Right: normal (2). Sensation on the Right: C5 normal, C6 normal, and C7 normal.  Positive impingement sign left shoulder  Skin:    General: Skin is warm and dry.  Neurological:     Mental Status: He is alert.    MRI of the right shoulder demonstrates essentially a full-thickness tear or high-grade tear of the insertional part of the supraspinatus. Some tearing of the subscap tendinosis of the biceps small glenohumeral effusion.  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  Plan:  We proceeded with a second  injection on his left shoulder I had extensive discussion with the patient concerning their pathology and relevant anatomy. Specific to the shoulder to avoid impingement positions during their activities of daily living as well as during exercise. Avoiding for example abduction to 90. Avoiding wide-based bench pressing. Avoiding repetitive circular motions of the upper extremity. Minimize direct loading to the joints such as with dips as well as side planks. In addition we discussed avoiding reaching backwards such as in a automobile to the back seat. Exercises to strengthen the rotator cuff and shoulder girdle were discussed as well. Appropriate follow-up was discussed as well including indications for injections as well as further or repeat imaging of the shoulder.  Given that his right is bothering him significantly he cannot lift his arm up above his head can participate in cardiac rehab we discussed a repair of the rotator cuff An extensive discussion concerning the pathology relevant anatomy and treatment options. After that discussion we mutually agreed to proceed with repair of the rotator cuff utilizing arthroscopic assistance if possible. The risks and benefits of that procedure were discussed including bleeding, infection, suboptimal range of motion, deep venous thrombosis, pulmonary embolism, anesthetic complications etc. in addition we discussed the postoperative course to include approximately 4 weeks of passive range of motion followed by 4 weeks of active range of motion followed by 4-12 weeks of progressive strengthening exercises. In addition we discussed protective activities   to reduce the risk of a reinjury including impingement activities with elbow above the shoulder as well as reaching and repetitive circular motion activities. The hospital stay will either be as a outpatient with a regional block versus overnight depending upon the extent of the procedure and any challenging health  issues with a first postoperative visit 2 weeks following the surgery. Will need preoperative clearance that may delay us. We discussed similar procedure on the left at some point but is more symptomatic on the right  Plan right shoulder mini-open RCR, SAD  Ladd Cen M Lamira Borin, PA-C for Dr. Beane 08/17/2020, 10:52 AM   

## 2020-08-19 ENCOUNTER — Telehealth: Payer: Self-pay | Admitting: Family Medicine

## 2020-08-19 MED ORDER — SERTRALINE HCL 100 MG PO TABS: 100.0000 mg | ORAL_TABLET | Freq: Every day | ORAL | 1 refills | Status: DC

## 2020-08-19 NOTE — Telephone Encounter (Signed)
Refills of Sertraline sent to mail order. Called mail order and left message that diabetic supplies should be from [place].

## 2020-08-19 NOTE — Telephone Encounter (Signed)
Sertraline 90-day with refill  As for the Accu-Chek strips that should be through his diabetic doctor

## 2020-08-19 NOTE — Telephone Encounter (Signed)
[facility] requesting refill on Accu Check Strips, 31 gauge lancets and Sertraline 100 mg. Pt last seen 05/01/20 for HTN. Please advise. Thank you  Fax # [phone]

## 2020-08-19 NOTE — Addendum Note (Signed)
Addended by: Vicente Males on: 08/19/2020 04:08 PM   Modules accepted: Orders

## 2020-08-19 NOTE — Progress Notes (Signed)
Opened in error - please disregard

## 2020-08-20 ENCOUNTER — Encounter (HOSPITAL_COMMUNITY)
Admission: RE | Admit: 2020-08-20 | Discharge: 2020-08-20 | Disposition: A | Discharge status: Home / Self Care | Payer: Medicare HMO | Source: Ambulatory Visit | Attending: Nephrology | Admitting: Nephrology

## 2020-08-20 ENCOUNTER — Encounter (HOSPITAL_COMMUNITY): Payer: Self-pay

## 2020-08-20 ENCOUNTER — Other Ambulatory Visit: Payer: Self-pay

## 2020-08-20 DIAGNOSIS — D631 Anemia in chronic kidney disease: Secondary | ICD-10-CM | POA: Diagnosis not present

## 2020-08-20 DIAGNOSIS — N189 Chronic kidney disease, unspecified: Secondary | ICD-10-CM | POA: Diagnosis not present

## 2020-08-20 MED ORDER — SODIUM CHLORIDE 0.9 % IV SOLN: Freq: Once | INTRAVENOUS | Status: AC

## 2020-08-20 MED ORDER — SODIUM CHLORIDE 0.9 % IV SOLN
510.0000 mg | Freq: Once | INTRAVENOUS | Status: AC
  Administered 2020-08-20: 510 mg via INTRAVENOUS
  Filled 2020-08-20: qty 510

## 2020-08-20 NOTE — Patient Instructions (Addendum)
DUE TO COVID-19 ONLY ONE VISITOR IS ALLOWED TO COME WITH YOU AND STAY IN THE WAITING ROOM ONLY DURING PRE OP AND PROCEDURE DAY OF SURGERY. THE 1 VISITOR  MAY VISIT WITH YOU AFTER SURGERY IN YOUR PRIVATE ROOM DURING VISITING HOURS ONLY!  YOU NEED TO HAVE A COVID 19 TEST ON_3/12______ @__11 :30_____, THIS TEST MUST BE DONE BEFORE SURGERY,  COVID TESTING SITE [facility] [id number], IT IS ON THE RIGHT GOING OUT [address] APPROXIMATELY  2 MINUTES PAST [employer] ON THE RIGHT. ONCE YOUR COVID TEST IS COMPLETED,  PLEASE BEGIN THE QUARANTINE INSTRUCTIONS AS OUTLINED IN YOUR HANDOUT.                [name]    Your procedure is scheduled on: 08/26/20   Report to [facility] Main  Entrance   Report to admitting at  6:30 AM     Call this number if you have problems the morning of surgery [phone]   . BRUSH YOUR TEETH MORNING OF SURGERY AND RINSE YOUR MOUTH OUT, NO CHEWING GUM CANDY OR MINTS.  NO food after midnight   You may have clear liquids until 5:30 AM    Take these medicines the morning of surgery with A SIP OF WATER: Zoloft, Amlodipine,               use your inhaler if needed and bring it with you to the hospital   How to Manage Your Diabetes Before and After Surgery  Why is it important to control my blood sugar before and after surgery? . Improving blood sugar levels before and after surgery helps healing and can limit problems. . A way of improving blood sugar control is eating a healthy diet by: o  Eating less sugar and carbohydrates o  Increasing activity/exercise o  Talking with your doctor about reaching your blood sugar goals . High blood sugars (greater than 180 mg/dL) can raise your risk of infections and slow your recovery, so you will need to focus on controlling your diabetes during the weeks before surgery. . Make sure that the doctor who takes care of your diabetes knows about your planned surgery including the  date and location.  How do I manage my blood sugar before surgery? . Check your blood sugar at least 4 times a day, starting 2 days before surgery, to make sure that the level is not too high or low. o Check your blood sugar the morning of your surgery when you wake up and every 2 hours until you get to the Short Stay unit. . If your blood sugar is less than 70 mg/dL, you will need to treat for low blood sugar: o Do not take insulin. o Treat a low blood sugar (less than 70 mg/dL) with  cup of clear juice (cranberry or apple), 4 glucose tablets, OR glucose gel. o Recheck blood sugar in 15 minutes after treatment (to make sure it is greater than 70 mg/dL). If your blood sugar is not greater than 70 mg/dL on recheck, call [phone] for further instructions. . Report your blood sugar to the short stay nurse when you get to Short Stay.  . If you are admitted to the hospital after surgery: o Your blood sugar will be checked by the staff and you will probably be given insulin after surgery (instead of oral diabetes medicines) to make sure you have good blood sugar levels. o The goal for blood sugar control  after surgery is 80-180 mg/dL.   WHAT DO I DO ABOUT MY DIABETES MEDICATION?  Marland Kitchen Do not take oral diabetes medicines (pills) the morning of surgery.  . THE NIGHT BEFORE SURGERY, take 0    units of       insulin.       . THE MORNING OF SURGERY, take  8  units of   lantus   insulin.  . The day of surgery, do not take other diabetes injectables, including Byetta (exenatide), Bydureon (exenatide ER), Victoza (liraglutide), or Trulicity (dulaglutide).                                   You may not have any metal on your body including               piercings  Do not wear jewelry,  lotions, powders or deodorant               Men may shave face and neck.   Do not bring valuables to the hospital. [place].  Contacts, dentures or bridgework  may not be worn into surgery.      Patients discharged the day of surgery will not be allowed to drive home.   IF YOU ARE HAVING SURGERY AND GOING HOME THE SAME DAY, YOU MUST HAVE AN ADULT TO DRIVE YOU HOME AND BE WITH YOU FOR 24 HOURS. YOU MAY GO HOME BY TAXI OR UBER OR ORTHERWISE, BUT AN ADULT MUST ACCOMPANY YOU HOME AND STAY WITH YOU FOR 24 HOURS.  Name and phone number of your driver:  Special Instructions: N/A              Please read over the following fact sheets you were given: _____________________________________________________________________ [facility]- Preparing for Total Shoulder Arthroplasty    Before surgery, you can play an important role. Because skin is not sterile, your skin needs to be as free of germs as possible. You can reduce the number of germs on your skin by using the following products. . Benzoyl Peroxide Gel o Reduces the number of germs present on the skin o Applied twice a day to shoulder area starting two days before surgery    ==================================================================  Please follow these instructions carefully:  BENZOYL PEROXIDE 5% GEL  Please do not use if you have an allergy to benzoyl peroxide.   If your skin becomes reddened/irritated stop using the benzoyl peroxide.  Starting two days before surgery, apply as follows: 1. Apply benzoyl peroxide in the morning and at night. Apply after taking a shower. If you are not taking a shower clean entire shoulder front, back, and side along with the armpit with a clean wet washcloth.  2. Place a quarter-sized dollop on your shoulder and rub in thoroughly, making sure to cover the front, back, and side of your shoulder, along with the armpit.   2 days before ____ AM   ____ PM  (  3/14)          1 day before ____ AM   ____ PM (3/15)                         3. Do this twice a day for two days.  (Last application is the night before surgery, AFTER  using the CHG soap as described  below).  4. Do NOT apply benzoyl peroxide gel on the day of surgery.             [place] - Preparing for Surgery Before surgery, you can play an important role.  Because skin is not sterile, your skin needs to be as free of germs as possible.  You can reduce the number of germs on your skin by washing with CHG (chlorahexidine gluconate) soap before surgery.  CHG is an antiseptic cleaner which kills germs and bonds with the skin to continue killing germs even after washing. Please DO NOT use if you have an allergy to CHG or antibacterial soaps.  If your skin becomes reddened/irritated stop using the CHG and inform your nurse when you arrive at Short Stay.   You may shave your face/neck. Please follow these instructions carefully:  1.  Shower with CHG Soap the night before surgery and the  morning of Surgery.  2.  If you choose to wash your hair, wash your hair first as usual with your  normal  shampoo.  3.  After you shampoo, rinse your hair and body thoroughly to remove the  shampoo.                                        4.  Use CHG as you would any other liquid soap.  You can apply chg directly  to the skin and wash                       Gently with a scrungie or clean washcloth.  5.  Apply the CHG Soap to your body ONLY FROM THE NECK DOWN.   Do not use on face/ open                           Wound or open sores. Avoid contact with eyes, ears mouth and genitals (private parts).                       Wash face,  Genitals (private parts) with your normal soap.             6.  Wash thoroughly, paying special attention to the area where your surgery  will be performed.  7.  Thoroughly rinse your body with warm water from the neck down.  8.  DO NOT shower/wash with your normal soap after using and rinsing off  the CHG Soap.             9.  Pat yourself dry with a clean towel.            10.  Wear clean pajamas.            11.  Place clean sheets on your bed the night of your first shower  and do not  sleep with pets. Day of Surgery : Do not apply any lotions/deodorants the morning of surgery.  Please wear clean clothes to the hospital/surgery center.  FAILURE TO FOLLOW THESE INSTRUCTIONS MAY RESULT IN THE CANCELLATION OF YOUR SURGERY PATIENT SIGNATURE_________________________________  NURSE SIGNATURE__________________________________  ________________________________________________________________________   Adam Phenix  An incentive spirometer is a tool that can help keep your lungs clear and active. This tool measures how well you are filling your lungs with each  breath. Taking long deep breaths may help reverse or decrease the chance of developing breathing (pulmonary) problems (especially infection) following:  A long period of time when you are unable to move or be active. BEFORE THE PROCEDURE   If the spirometer includes an indicator to show your best effort, your nurse or respiratory therapist will set it to a desired goal.  If possible, sit up straight or lean slightly forward. Try not to slouch.  Hold the incentive spirometer in an upright position. INSTRUCTIONS FOR USE  1. Sit on the edge of your bed if possible, or sit up as far as you can in bed or on a chair. 2. Hold the incentive spirometer in an upright position. 3. Breathe out normally. 4. Place the mouthpiece in your mouth and seal your lips tightly around it. 5. Breathe in slowly and as deeply as possible, raising the piston or the ball toward the top of the column. 6. Hold your breath for 3-5 seconds or for as long as possible. Allow the piston or ball to fall to the bottom of the column. 7. Remove the mouthpiece from your mouth and breathe out normally. 8. Rest for a few seconds and repeat Steps 1 through 7 at least 10 times every 1-2 hours when you are awake. Take your time and take a few normal breaths between deep breaths. 9. The spirometer may include an indicator to show your best  effort. Use the indicator as a goal to work toward during each repetition. 10. After each set of 10 deep breaths, practice coughing to be sure your lungs are clear. If you have an incision (the cut made at the time of surgery), support your incision when coughing by placing a pillow or rolled up towels firmly against it. Once you are able to get out of bed, walk around indoors and cough well. You may stop using the incentive spirometer when instructed by your caregiver.  RISKS AND COMPLICATIONS  Take your time so you do not get dizzy or light-headed.  If you are in pain, you may need to take or ask for pain medication before doing incentive spirometry. It is harder to take a deep breath if you are having pain. AFTER USE  Rest and breathe slowly and easily.  It can be helpful to keep track of a log of your progress. Your caregiver can provide you with a simple table to help with this. If you are using the spirometer at home, follow these instructions: [place] IF:   You are having difficultly using the spirometer.  You have trouble using the spirometer as often as instructed.  Your pain medication is not giving enough relief while using the spirometer.  You develop fever of 100.5 F (38.1 C) or higher. SEEK IMMEDIATE MEDICAL CARE IF:   You cough up bloody sputum that had not been present before.  You develop fever of 102 F (38.9 C) or greater.  You develop worsening pain at or near the incision site. MAKE SURE YOU:   Understand these instructions.  Will watch your condition.  Will get help right away if you are not doing well or get worse. Document Released: 10/10/2006 Document Revised: 08/22/2011 Document Reviewed: 12/11/2006 [facility] Patient Information 2014 [place], [place].   ________________________________________________________________________

## 2020-08-21 ENCOUNTER — Other Ambulatory Visit: Payer: Self-pay

## 2020-08-21 ENCOUNTER — Encounter (HOSPITAL_COMMUNITY)
Admission: RE | Admit: 2020-08-21 | Discharge: 2020-08-21 | Disposition: A | Discharge status: Home / Self Care | Payer: Medicare HMO | Source: Ambulatory Visit | Attending: Specialist | Admitting: Specialist

## 2020-08-21 ENCOUNTER — Encounter (HOSPITAL_COMMUNITY): Payer: Self-pay

## 2020-08-21 DIAGNOSIS — Z01818 Encounter for other preprocedural examination: Secondary | ICD-10-CM | POA: Insufficient documentation

## 2020-08-21 HISTORY — DX: Sleep apnea, unspecified: G47.30

## 2020-08-21 HISTORY — DX: Unspecified osteoarthritis, unspecified site: M19.90

## 2020-08-21 HISTORY — DX: Acute myocardial infarction, unspecified: I21.9

## 2020-08-21 LAB — BASIC METABOLIC PANEL
Anion gap: 9 (ref 5–15)
BUN: 27 mg/dL — ABNORMAL HIGH (ref 8–23)
CO2: 25 mmol/L (ref 22–32)
Calcium: 9.7 mg/dL (ref 8.9–10.3)
Chloride: 107 mmol/L (ref 98–111)
Creatinine, Ser: 2.63 mg/dL — ABNORMAL HIGH (ref 0.61–1.24)
GFR, Estimated: 27 mL/min — ABNORMAL LOW (ref >60)
Glucose, Bld: 161 mg/dL — ABNORMAL HIGH (ref 70–99)
Potassium: 3.7 mmol/L (ref 3.5–5.1)
Sodium: 141 mmol/L (ref 135–145)

## 2020-08-21 LAB — CBC
HCT: 37.2 % — ABNORMAL LOW (ref 39.0–52.0)
Hemoglobin: 12.1 g/dL — ABNORMAL LOW (ref 13.0–17.0)
MCH: 27.9 pg (ref 26.0–34.0)
MCHC: 32.5 g/dL (ref 30.0–36.0)
MCV: 85.9 fL (ref 80.0–100.0)
Platelets: 313 10*3/uL (ref 150–400)
RBC: 4.33 MIL/uL (ref 4.22–5.81)
RDW: 13.1 % (ref 11.5–15.5)
WBC: 7.7 10*3/uL (ref 4.0–10.5)
nRBC: 0 % (ref 0.0–0.2)

## 2020-08-21 LAB — HEMOGLOBIN A1C
Hgb A1c MFr Bld: 6.9 % — ABNORMAL HIGH (ref 4.8–5.6)
Mean Plasma Glucose: 151.33 mg/dL

## 2020-08-21 LAB — GLUCOSE, CAPILLARY: Glucose-Capillary: 185 mg/dL — ABNORMAL HIGH (ref 70–99)

## 2020-08-21 NOTE — Progress Notes (Signed)
COVID Vaccine Completed:yes Date COVID Vaccine completed:09/21/19 booster 09/21/19 COVID vaccine manufacturer:  [employer]   PCP - Dr. Lance Sell Cardiologist - Dr. Conni Slipper  Chest x-ray - 04/28/20-epic EKG - 08/21/20-chart , epic Stress Test - no ECHO - 10/07/19-epic Cardiac Cath - 2020-CABG x4 03/2019 Pacemaker/ICD device last checked:NA  Sleep Study -yes  CPAP - yes  Fasting Blood Sugar - 117-168 Checks Blood Sugar _____ times a day 1-2 times a day  Blood Thinner Instructions:asa 81 Aspirin Instructions:none Last Dose:  Anesthesia review:   Patient denies shortness of breath, fever, cough and chest pain at PAT appointment yes  Patient verbalized understanding of instructions that were given to them at the PAT appointment. 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 He reports that he can climb 4 flights of stairs, work and ADLs without SOB. He uses an inhaler rarely, only when sick maybe once a year.

## 2020-08-22 ENCOUNTER — Other Ambulatory Visit (HOSPITAL_COMMUNITY)
Admission: RE | Admit: 2020-08-22 | Discharge: 2020-08-22 | Disposition: A | Discharge status: Home / Self Care | Payer: Medicare HMO | Source: Ambulatory Visit | Attending: Specialist | Admitting: Specialist

## 2020-08-22 DIAGNOSIS — Z20822 Contact with and (suspected) exposure to covid-19: Secondary | ICD-10-CM | POA: Diagnosis not present

## 2020-08-22 DIAGNOSIS — Z01812 Encounter for preprocedural laboratory examination: Secondary | ICD-10-CM | POA: Insufficient documentation

## 2020-08-22 LAB — SARS CORONAVIRUS 2 (TAT 6-24 HRS): SARS Coronavirus 2: NEGATIVE

## 2020-08-24 NOTE — Progress Notes (Signed)
Anesthesia Chart Review   Case: [id number] Date/Time: 08/26/20 0815   Procedure: SHOULDER ARTHROSCOPY WITH MINI OPEN ROTATOR CUFF REPAIR AND SUBACROMIAL DECOMPRESSION (Right ) - 90 MINS GENERAL WITH BLOCK   Anesthesia type: General   Pre-op diagnosis: Right shoulder rotator cuff tear   Location: [facility] ROOM 04 / [facility]   Surgeons: Susa Day, MD      DISCUSSION:65 y.o. former smoker with h/o HTN, DM II, CHF, CAD (CABG 2020), CKD Stage III (creatinine stable, follows with nephrology), sleep apnea, right shoulder rotator cuff tear scheduled for above procedure 08/26/2020 with Dr. Susa Day.   Pt last seen by cardiology 06/17/2020. No cv sx at this time. F/u with pharmacist 07/21/2020 for better management of HTN. Amlodipine increased at this visit.   VS: BP (!) 149/85   Pulse 86   Temp 36.8 C   Resp 18   Ht 5\' 10"  (1.778 m)   Wt 90.7 kg   SpO2 95%   BMI 28.70 kg/m   PROVIDERS: [name], MD is PCP   Skeet Latch, MD is Cardiologist  LABS: Labs reviewed: Acceptable for surgery. (all labs ordered are listed, but only abnormal results are displayed)  Labs Reviewed  CBC - Abnormal; Notable for the following components:      Result Value   Hemoglobin 12.1 (*)    HCT 37.2 (*)    All other components within normal limits  BASIC METABOLIC PANEL - Abnormal; Notable for the following components:   Glucose, Bld 161 (*)    BUN 27 (*)    Creatinine, Ser 2.63 (*)    GFR, Estimated 27 (*)    All other components within normal limits  HEMOGLOBIN A1C - Abnormal; Notable for the following components:   Hgb A1c MFr Bld 6.9 (*)    All other components within normal limits  GLUCOSE, CAPILLARY - Abnormal; Notable for the following components:   Glucose-Capillary 185 (*)    All other components within normal limits     IMAGES:   EKG: 08/21/20 Rate 82 bpm  Normal sinus rhythm Inferior infarct , age undetermined ST & T wave abnormality, consider lateral ischemia Abnormal  ECG No significant change since last tracing  CV: Echo 10/07/2019 IMPRESSIONS    1. Left ventricular ejection fraction, by estimation, is 55 to 60%. The  left ventricle has normal function. The left ventricle has no regional  wall motion abnormalities. There is moderate left ventricular hypertrophy.  Left ventricular diastolic  parameters are consistent with Grade I diastolic dysfunction (impaired  relaxation).  2. Right ventricular systolic function is normal. The right ventricular  size is normal.  3. Left atrial size was severely dilated.  4. The mitral valve is normal in structure. Trivial mitral valve  regurgitation. No evidence of mitral stenosis.  5. The aortic valve was not well visualized. Aortic valve regurgitation  is not visualized. No aortic stenosis is present.  6. The inferior vena cava is normal in size with greater than 50%  respiratory variability, suggesting right atrial pressure of 3 mmHg. Past Medical History:  Diagnosis Date  . Arthritis    hips shoulders  . CAD (coronary artery disease) 03/03/2019  . CHF (congestive heart failure) ([place]) 2020  . Hypertension   . Myocardial infarction ([place])   . Sleep apnea   . Type 2 diabetes mellitus with diabetic nephropathy ([place]) 04/23/2019    Past Surgical History:  Procedure Laterality Date  . BIOPSY  06/27/2019   Procedure: BIOPSY;  Surgeon:  Rogene Houston, MD;  [facility];  Service: Endoscopy;;  ascending colon polyp  . CARDIAC SURGERY    . COLONOSCOPY N/A 06/27/2019   Procedure: COLONOSCOPY;  Surgeon: Rogene Houston, MD;  [facility];  Service: Endoscopy;  Laterality: N/A;  1030am-office rescheduled to 06/27/19 @9 :55am  . CORONARY ARTERY BYPASS GRAFT N/A 03/25/2019   Procedure: CORONARY ARTERY BYPASS GRAFTING (CABG) X  4 USING LEFT INTERNAL MAMMARY ARTERY AND RIGHT SAPHENOUS VEIN GRAFTS;  Surgeon: [name], MD;  Location: [facility] [place];  Service: Open Heart Surgery;   Laterality: N/A;  . EXCISION MORTON'S NEUROMA    . EYE SURGERY Left    retina  . POLYPECTOMY  06/27/2019   Procedure: POLYPECTOMY;  Surgeon: Rogene Houston, MD;  [facility];  Service: Endoscopy;;  proximal sigmoid colon  . RIGHT/LEFT HEART CATH AND CORONARY ANGIOGRAPHY N/A 03/12/2019   Procedure: RIGHT/LEFT HEART CATH AND CORONARY ANGIOGRAPHY;  Surgeon: [name], MD;  Location: [facility];  Service: Cardiovascular;  Laterality: N/A;  . TEE WITHOUT CARDIOVERSION N/A 03/25/2019   Procedure: TRANSESOPHAGEAL ECHOCARDIOGRAM (TEE);  Surgeon: [name], MD;  Location: [place];  Service: Open Heart Surgery;  Laterality: N/A;    MEDICATIONS: . acetaminophen (TYLENOL) 500 MG tablet  . albuterol (VENTOLIN HFA) 108 (90 Base) MCG/ACT inhaler  . amLODipine (NORVASC) 10 MG tablet  . aspirin EC 81 MG tablet  . Cholecalciferol (VITAMIN D-3) 125 MCG (5000 UT) TABS  . Coenzyme Q10 (COQ10 PO)  . Cyanocobalamin (B-12) 2500 MCG TABS  . diphenhydramine-acetaminophen (TYLENOL PM) 25-500 MG TABS tablet  . doxazosin (CARDURA) 2 MG tablet  . Evolocumab (REPATHA SURECLICK) 276 MG/ML SOAJ  . famotidine (PEPCID) 20 MG tablet  . furosemide (LASIX) 40 MG tablet  . HYDROcodone-acetaminophen (NORCO/VICODIN) 5-325 MG tablet  . insulin glargine (LANTUS SOLOSTAR) 100 UNIT/ML Solostar Pen  . Insulin Pen Needle (B-D ULTRAFINE III SHORT PEN) 31G X 8 MM MISC  . Magnesium 100 MG CAPS  . Multiple Vitamin (MULTIVITAMIN WITH MINERALS) TABS tablet  . ondansetron (ZOFRAN ODT) 4 MG disintegrating tablet  . Semaglutide (OZEMPIC, 0.25 OR 0.5 MG/DOSE, [place])  . sertraline (ZOLOFT) 100 MG tablet  . valsartan (DIOVAN) 80 MG tablet   No current facility-administered medications for this encounter.    Konrad Felix, PA-C [facility] [phone]

## 2020-08-25 NOTE — Anesthesia Preprocedure Evaluation (Addendum)
Anesthesia Evaluation  Patient identified by MRN, date of birth, ID band Patient awake    Reviewed: Allergy & Precautions, NPO status , Patient's Chart, lab work & pertinent test results, reviewed documented beta blocker date and time   Airway Mallampati: II  TM Distance: >3 FB Neck ROM: Full    Dental no notable dental hx. (+) Teeth Intact, Dental Advisory Given   Pulmonary former smoker,    Pulmonary exam normal breath sounds clear to auscultation       Cardiovascular hypertension, Pt. on medications and Pt. on home beta blockers + CAD and +CHF  Normal cardiovascular exam Rhythm:Regular Rate:Normal  Echo 4/21  1. Left ventricular ejection fraction, by estimation, is 55 to 60%. The left ventricle has normal function. The left ventricle has no regional wall motion abnormalities. There is moderate left ventricular hypertrophy. Left ventricular diastolic parameters are consistent with Grade I diastolic dysfunction (impaired relaxation). 2. Right ventricular systolic function is normal. The right ventricular size is normal. 3. Left atrial size was severely dilated. 4. The mitral valve is normal in structure. Trivial mitral valve regurgitation. No evidence of mitral stenosis. 5. The aortic valve was not well visualized. Aortic valve regurgitation is not visualized. No aortic stenosis is present. 6. The inferior vena cava is normal in size with greater   Neuro/Psych negative neurological ROS  negative psych ROS   GI/Hepatic negative GI ROS, Neg liver ROS,   Endo/Other  diabetes, Type 2, Oral Hypoglycemic Agents  Renal/GU CRFRenal disease  negative genitourinary   Musculoskeletal  (+) Arthritis , Osteoarthritis,    Abdominal   Peds negative pediatric ROS (+)  Hematology negative hematology ROS (+)   Anesthesia Other Findings Day of surgery medications reviewed with the patient.  Reproductive/Obstetrics negative OB  ROS                          Anesthesia Physical  Anesthesia Plan  ASA: III  Anesthesia Plan: General   Post-op Pain Management: GA combined w/ Regional for post-op pain   Induction: Intravenous  PONV Risk Score and Plan: 2 and Ondansetron, Midazolam and Treatment may vary due to age or medical condition  Airway Management Planned: Oral ETT  Additional Equipment: None  Intra-op Plan: Utilization Of Total Body Hypothermia per surgeon request  Post-operative Plan: Post-operative intubation/ventilation  Informed Consent: I have reviewed the patients History and Physical, chart, labs and discussed the procedure including the risks, benefits and alternatives for the proposed anesthesia with the patient or authorized representative who has indicated his/her understanding and acceptance.     Dental advisory given  Plan Discussed with: CRNA and Anesthesiologist  Anesthesia Plan Comments: (CKD III followed by PCP Dr. [name]. Creatinine 2.14 on preop labs. Recent baseline appears to be ~1.9.)        Anesthesia Quick Evaluation

## 2020-08-26 ENCOUNTER — Ambulatory Visit (HOSPITAL_COMMUNITY): Payer: Medicare HMO | Admitting: Physician Assistant

## 2020-08-26 ENCOUNTER — Encounter (HOSPITAL_COMMUNITY)
Admission: RE | Disposition: A | Discharge status: Home / Self Care | Payer: Self-pay | Source: Other Acute Inpatient Hospital | Attending: Specialist

## 2020-08-26 ENCOUNTER — Ambulatory Visit (HOSPITAL_COMMUNITY): Payer: Medicare HMO | Admitting: Certified Registered"

## 2020-08-26 ENCOUNTER — Encounter (HOSPITAL_COMMUNITY): Payer: Self-pay | Admitting: Specialist

## 2020-08-26 ENCOUNTER — Ambulatory Visit (HOSPITAL_COMMUNITY)
Admission: RE | Admit: 2020-08-26 | Discharge: 2020-08-27 | Disposition: A | Discharge status: Home / Self Care | Payer: Medicare HMO | Source: Other Acute Inpatient Hospital | Attending: Specialist | Admitting: Specialist

## 2020-08-26 ENCOUNTER — Other Ambulatory Visit: Payer: Self-pay

## 2020-08-26 DIAGNOSIS — Z87891 Personal history of nicotine dependence: Secondary | ICD-10-CM | POA: Insufficient documentation

## 2020-08-26 DIAGNOSIS — Z951 Presence of aortocoronary bypass graft: Secondary | ICD-10-CM | POA: Diagnosis not present

## 2020-08-26 DIAGNOSIS — N183 Chronic kidney disease, stage 3 unspecified: Secondary | ICD-10-CM | POA: Diagnosis not present

## 2020-08-26 DIAGNOSIS — M7541 Impingement syndrome of right shoulder: Secondary | ICD-10-CM | POA: Insufficient documentation

## 2020-08-26 DIAGNOSIS — E1122 Type 2 diabetes mellitus with diabetic chronic kidney disease: Secondary | ICD-10-CM | POA: Diagnosis not present

## 2020-08-26 DIAGNOSIS — I130 Hypertensive heart and chronic kidney disease with heart failure and stage 1 through stage 4 chronic kidney disease, or unspecified chronic kidney disease: Secondary | ICD-10-CM | POA: Diagnosis not present

## 2020-08-26 DIAGNOSIS — M75111 Incomplete rotator cuff tear or rupture of right shoulder, not specified as traumatic: Secondary | ICD-10-CM | POA: Diagnosis not present

## 2020-08-26 DIAGNOSIS — M75120 Complete rotator cuff tear or rupture of unspecified shoulder, not specified as traumatic: Secondary | ICD-10-CM | POA: Diagnosis present

## 2020-08-26 DIAGNOSIS — Z794 Long term (current) use of insulin: Secondary | ICD-10-CM | POA: Diagnosis not present

## 2020-08-26 DIAGNOSIS — E1121 Type 2 diabetes mellitus with diabetic nephropathy: Secondary | ICD-10-CM | POA: Diagnosis not present

## 2020-08-26 DIAGNOSIS — M75101 Unspecified rotator cuff tear or rupture of right shoulder, not specified as traumatic: Secondary | ICD-10-CM | POA: Diagnosis not present

## 2020-08-26 DIAGNOSIS — G8918 Other acute postprocedural pain: Secondary | ICD-10-CM | POA: Diagnosis not present

## 2020-08-26 DIAGNOSIS — M75121 Complete rotator cuff tear or rupture of right shoulder, not specified as traumatic: Secondary | ICD-10-CM | POA: Diagnosis not present

## 2020-08-26 DIAGNOSIS — I5041 Acute combined systolic (congestive) and diastolic (congestive) heart failure: Secondary | ICD-10-CM | POA: Diagnosis not present

## 2020-08-26 HISTORY — DX: Complete rotator cuff tear or rupture of unspecified shoulder, not specified as traumatic: M75.120

## 2020-08-26 HISTORY — PX: SHOULDER ARTHROSCOPY WITH ROTATOR CUFF REPAIR AND SUBACROMIAL DECOMPRESSION: SHX5686

## 2020-08-26 LAB — GLUCOSE, CAPILLARY
Glucose-Capillary: 156 mg/dL — ABNORMAL HIGH (ref 70–99)
Glucose-Capillary: 181 mg/dL — ABNORMAL HIGH (ref 70–99)
Glucose-Capillary: 245 mg/dL — ABNORMAL HIGH (ref 70–99)
Glucose-Capillary: 227 mg/dL — ABNORMAL HIGH (ref 70–99)

## 2020-08-26 SURGERY — SHOULDER ARTHROSCOPY WITH ROTATOR CUFF REPAIR AND SUBACROMIAL DECOMPRESSION
Anesthesia: General | Site: Shoulder | Laterality: Right

## 2020-08-26 MED ORDER — DIPHENHYDRAMINE HCL 25 MG PO CAPS: 25.0000 mg | ORAL_CAPSULE | Freq: Every evening | ORAL | Status: DC | PRN

## 2020-08-26 MED ORDER — FENTANYL CITRATE (PF) 100 MCG/2ML IJ SOLN
INTRAMUSCULAR | Status: AC
  Filled 2020-08-26: qty 2

## 2020-08-26 MED ORDER — INSULIN GLARGINE 100 UNIT/ML ~~LOC~~ SOLN
18.0000 [IU] | Freq: Every day | SUBCUTANEOUS | Status: DC
  Administered 2020-08-27: 18 [IU] via SUBCUTANEOUS
  Filled 2020-08-26: qty 0.18

## 2020-08-26 MED ORDER — METOCLOPRAMIDE HCL 5 MG/ML IJ SOLN: 5.0000 mg | Freq: Three times a day (TID) | INTRAMUSCULAR | Status: DC | PRN

## 2020-08-26 MED ORDER — PROPOFOL 10 MG/ML IV BOLUS
INTRAVENOUS | Status: DC | PRN
  Administered 2020-08-26: 200 mg via INTRAVENOUS
  Administered 2020-08-26: 50 mg via INTRAVENOUS

## 2020-08-26 MED ORDER — EVOLOCUMAB 140 MG/ML ~~LOC~~ SOAJ: 140.0000 mg | SUBCUTANEOUS | Status: DC

## 2020-08-26 MED ORDER — MENTHOL 3 MG MT LOZG: 1.0000 | LOZENGE | OROMUCOSAL | Status: DC | PRN

## 2020-08-26 MED ORDER — EPHEDRINE SULFATE-NACL 50-0.9 MG/10ML-% IV SOSY
PREFILLED_SYRINGE | INTRAVENOUS | Status: DC | PRN
  Administered 2020-08-26: 5 mg via INTRAVENOUS

## 2020-08-26 MED ORDER — LACTATED RINGERS IV SOLN: INTRAVENOUS | Status: DC

## 2020-08-26 MED ORDER — FUROSEMIDE 40 MG PO TABS
40.0000 mg | ORAL_TABLET | Freq: Every day | ORAL | Status: DC
  Administered 2020-08-26 – 2020-08-27 (×2): 40 mg via ORAL
  Filled 2020-08-26 (×2): qty 1

## 2020-08-26 MED ORDER — ALBUTEROL SULFATE (2.5 MG/3ML) 0.083% IN NEBU: 2.5000 mg | INHALATION_SOLUTION | Freq: Four times a day (QID) | RESPIRATORY_TRACT | Status: DC | PRN

## 2020-08-26 MED ORDER — OXYCODONE HCL 5 MG/5ML PO SOLN: 5.0000 mg | Freq: Once | ORAL | Status: DC | PRN

## 2020-08-26 MED ORDER — OXYCODONE HCL 5 MG PO TABS: 5.0000 mg | ORAL_TABLET | ORAL | 0 refills | Status: DC | PRN

## 2020-08-26 MED ORDER — PHENYLEPHRINE HCL-NACL 10-0.9 MG/250ML-% IV SOLN
INTRAVENOUS | Status: DC | PRN
  Administered 2020-08-26: 30 ug/min via INTRAVENOUS

## 2020-08-26 MED ORDER — BUPIVACAINE-EPINEPHRINE (PF) 0.25% -1:200000 IJ SOLN
INTRAMUSCULAR | Status: DC | PRN
  Administered 2020-08-26: 8 mL

## 2020-08-26 MED ORDER — BUPIVACAINE LIPOSOME 1.3 % IJ SUSP
INTRAMUSCULAR | Status: DC | PRN
  Administered 2020-08-26: 10 mL via PERINEURAL

## 2020-08-26 MED ORDER — CEFAZOLIN SODIUM-DEXTROSE 2-4 GM/100ML-% IV SOLN
2.0000 g | INTRAVENOUS | Status: AC
  Administered 2020-08-26: 2 g via INTRAVENOUS
  Filled 2020-08-26: qty 100

## 2020-08-26 MED ORDER — ONDANSETRON 4 MG PO TBDP: 4.0000 mg | ORAL_TABLET | Freq: Three times a day (TID) | ORAL | Status: DC | PRN

## 2020-08-26 MED ORDER — ONDANSETRON HCL 4 MG/2ML IJ SOLN
INTRAMUSCULAR | Status: DC | PRN
  Administered 2020-08-26: 4 mg via INTRAVENOUS

## 2020-08-26 MED ORDER — SERTRALINE HCL 100 MG PO TABS
100.0000 mg | ORAL_TABLET | Freq: Every day | ORAL | Status: DC
  Administered 2020-08-27: 100 mg via ORAL
  Filled 2020-08-26: qty 1

## 2020-08-26 MED ORDER — ONDANSETRON HCL 4 MG/2ML IJ SOLN: 4.0000 mg | Freq: Once | INTRAMUSCULAR | Status: DC | PRN

## 2020-08-26 MED ORDER — ADULT MULTIVITAMIN W/MINERALS CH
1.0000 | ORAL_TABLET | Freq: Every day | ORAL | Status: DC
  Administered 2020-08-27: 1 via ORAL
  Filled 2020-08-26: qty 1

## 2020-08-26 MED ORDER — ONDANSETRON HCL 4 MG/2ML IJ SOLN
INTRAMUSCULAR | Status: AC
  Filled 2020-08-26: qty 2

## 2020-08-26 MED ORDER — EPHEDRINE 5 MG/ML INJ
INTRAVENOUS | Status: AC
  Filled 2020-08-26: qty 10

## 2020-08-26 MED ORDER — 0.9 % SODIUM CHLORIDE (POUR BTL) OPTIME
TOPICAL | Status: DC | PRN
  Administered 2020-08-26: 1000 mL

## 2020-08-26 MED ORDER — CHLORHEXIDINE GLUCONATE 0.12 % MT SOLN
15.0000 mL | Freq: Once | OROMUCOSAL | Status: AC
  Administered 2020-08-26: 15 mL via OROMUCOSAL

## 2020-08-26 MED ORDER — ASPIRIN EC 81 MG PO TBEC
81.0000 mg | DELAYED_RELEASE_TABLET | Freq: Every day | ORAL | Status: DC
  Administered 2020-08-27: 81 mg via ORAL
  Filled 2020-08-26: qty 1

## 2020-08-26 MED ORDER — MEPERIDINE HCL 50 MG/ML IJ SOLN: 6.2500 mg | INTRAMUSCULAR | Status: DC | PRN

## 2020-08-26 MED ORDER — VITAMIN D 25 MCG (1000 UNIT) PO TABS
5000.0000 [IU] | ORAL_TABLET | Freq: Every day | ORAL | Status: DC
  Administered 2020-08-26 – 2020-08-27 (×2): 5000 [IU] via ORAL
  Filled 2020-08-26 (×2): qty 5

## 2020-08-26 MED ORDER — OXYCODONE HCL 5 MG PO TABS: 5.0000 mg | ORAL_TABLET | Freq: Once | ORAL | Status: DC | PRN

## 2020-08-26 MED ORDER — DOXAZOSIN MESYLATE 2 MG PO TABS
2.0000 mg | ORAL_TABLET | Freq: Every evening | ORAL | Status: DC
  Administered 2020-08-26: 2 mg via ORAL
  Filled 2020-08-26: qty 1

## 2020-08-26 MED ORDER — BISACODYL 5 MG PO TBEC: 5.0000 mg | DELAYED_RELEASE_TABLET | Freq: Every day | ORAL | Status: DC | PRN

## 2020-08-26 MED ORDER — PHENOL 1.4 % MT LIQD: 1.0000 | OROMUCOSAL | Status: DC | PRN

## 2020-08-26 MED ORDER — BUPIVACAINE-EPINEPHRINE (PF) 0.25% -1:200000 IJ SOLN
INTRAMUSCULAR | Status: AC
  Filled 2020-08-26: qty 30

## 2020-08-26 MED ORDER — DEXAMETHASONE SODIUM PHOSPHATE 10 MG/ML IJ SOLN
INTRAMUSCULAR | Status: DC | PRN
  Administered 2020-08-26: 4 mg via INTRAVENOUS

## 2020-08-26 MED ORDER — HYDROMORPHONE HCL 1 MG/ML IJ SOLN: 0.5000 mg | INTRAMUSCULAR | Status: DC | PRN

## 2020-08-26 MED ORDER — FENTANYL CITRATE (PF) 100 MCG/2ML IJ SOLN: 25.0000 ug | INTRAMUSCULAR | Status: DC | PRN

## 2020-08-26 MED ORDER — ORAL CARE MOUTH RINSE: 15.0000 mL | Freq: Once | OROMUCOSAL | Status: AC

## 2020-08-26 MED ORDER — IRBESARTAN 75 MG PO TABS
75.0000 mg | ORAL_TABLET | Freq: Every day | ORAL | Status: DC
  Administered 2020-08-26 – 2020-08-27 (×2): 75 mg via ORAL
  Filled 2020-08-26 (×2): qty 1

## 2020-08-26 MED ORDER — LIDOCAINE 2% (20 MG/ML) 5 ML SYRINGE
INTRAMUSCULAR | Status: AC
  Filled 2020-08-26: qty 5

## 2020-08-26 MED ORDER — AMLODIPINE BESYLATE 10 MG PO TABS
10.0000 mg | ORAL_TABLET | Freq: Every day | ORAL | Status: DC
  Administered 2020-08-26 – 2020-08-27 (×2): 10 mg via ORAL
  Filled 2020-08-26 (×2): qty 1

## 2020-08-26 MED ORDER — RISAQUAD PO CAPS: 1.0000 | ORAL_CAPSULE | Freq: Every day | ORAL | Status: DC

## 2020-08-26 MED ORDER — ALUM & MAG HYDROXIDE-SIMETH 200-200-20 MG/5ML PO SUSP: 30.0000 mL | ORAL | Status: DC | PRN

## 2020-08-26 MED ORDER — ACETAMINOPHEN 325 MG PO TABS: 325.0000 mg | ORAL_TABLET | ORAL | Status: DC | PRN

## 2020-08-26 MED ORDER — POLYETHYLENE GLYCOL 3350 17 G PO PACK: 17.0000 g | PACK | Freq: Every day | ORAL | Status: DC | PRN

## 2020-08-26 MED ORDER — METHOCARBAMOL 500 MG PO TABS: 500.0000 mg | ORAL_TABLET | Freq: Four times a day (QID) | ORAL | Status: DC | PRN

## 2020-08-26 MED ORDER — MIDAZOLAM HCL 2 MG/2ML IJ SOLN
INTRAMUSCULAR | Status: AC
  Filled 2020-08-26: qty 2

## 2020-08-26 MED ORDER — INSULIN ASPART 100 UNIT/ML ~~LOC~~ SOLN
0.0000 [IU] | Freq: Three times a day (TID) | SUBCUTANEOUS | Status: DC
  Administered 2020-08-26: 5 [IU] via SUBCUTANEOUS
  Administered 2020-08-27: 3 [IU] via SUBCUTANEOUS

## 2020-08-26 MED ORDER — ACETAMINOPHEN 160 MG/5ML PO SOLN: 325.0000 mg | ORAL | Status: DC | PRN

## 2020-08-26 MED ORDER — METOCLOPRAMIDE HCL 5 MG PO TABS: 5.0000 mg | ORAL_TABLET | Freq: Three times a day (TID) | ORAL | Status: DC | PRN

## 2020-08-26 MED ORDER — FENTANYL CITRATE (PF) 100 MCG/2ML IJ SOLN
25.0000 ug | INTRAMUSCULAR | Status: DC | PRN
Start: 2020-08-26 — End: 2020-08-26

## 2020-08-26 MED ORDER — ACETAMINOPHEN 160 MG/5ML PO SOLN
325.0000 mg | ORAL | Status: DC | PRN
Start: 2020-08-26 — End: 2020-08-26

## 2020-08-26 MED ORDER — RISAQUAD PO CAPS
1.0000 | ORAL_CAPSULE | Freq: Every day | ORAL | Status: DC
  Administered 2020-08-26 – 2020-08-27 (×2): 1 via ORAL
  Filled 2020-08-26 (×2): qty 1

## 2020-08-26 MED ORDER — ROCURONIUM BROMIDE 10 MG/ML (PF) SYRINGE
PREFILLED_SYRINGE | INTRAVENOUS | Status: AC
  Filled 2020-08-26: qty 10

## 2020-08-26 MED ORDER — DOCUSATE SODIUM 100 MG PO CAPS: 100.0000 mg | ORAL_CAPSULE | Freq: Two times a day (BID) | ORAL | 1 refills | Status: DC | PRN

## 2020-08-26 MED ORDER — BUPIVACAINE-EPINEPHRINE (PF) 0.5% -1:200000 IJ SOLN
INTRAMUSCULAR | Status: DC | PRN
  Administered 2020-08-26: 15 mL via PERINEURAL

## 2020-08-26 MED ORDER — KCL IN DEXTROSE-NACL 20-5-0.9 MEQ/L-%-% IV SOLN
INTRAVENOUS | Status: DC
  Filled 2020-08-26 (×2): qty 1000

## 2020-08-26 MED ORDER — MIDAZOLAM HCL 2 MG/2ML IJ SOLN
INTRAMUSCULAR | Status: DC | PRN
  Administered 2020-08-26: 1 mg via INTRAVENOUS

## 2020-08-26 MED ORDER — PROPOFOL 10 MG/ML IV BOLUS
INTRAVENOUS | Status: AC
  Filled 2020-08-26: qty 40

## 2020-08-26 MED ORDER — METHOCARBAMOL 500 MG PO TABS: 500.0000 mg | ORAL_TABLET | Freq: Four times a day (QID) | ORAL | 1 refills | Status: DC | PRN

## 2020-08-26 MED ORDER — ROCURONIUM BROMIDE 10 MG/ML (PF) SYRINGE
PREFILLED_SYRINGE | INTRAVENOUS | Status: DC | PRN
  Administered 2020-08-26: 70 mg via INTRAVENOUS
  Administered 2020-08-26: 10 mg via INTRAVENOUS

## 2020-08-26 MED ORDER — FENTANYL CITRATE (PF) 250 MCG/5ML IJ SOLN
INTRAMUSCULAR | Status: DC | PRN
  Administered 2020-08-26 (×2): 50 ug via INTRAVENOUS

## 2020-08-26 MED ORDER — CEFAZOLIN SODIUM-DEXTROSE 2-4 GM/100ML-% IV SOLN
2.0000 g | Freq: Three times a day (TID) | INTRAVENOUS | Status: AC
  Administered 2020-08-26 (×2): 2 g via INTRAVENOUS
  Filled 2020-08-26 (×2): qty 100

## 2020-08-26 MED ORDER — DOCUSATE SODIUM 100 MG PO CAPS
100.0000 mg | ORAL_CAPSULE | Freq: Two times a day (BID) | ORAL | Status: DC
  Administered 2020-08-26 – 2020-08-27 (×2): 100 mg via ORAL
  Filled 2020-08-26 (×2): qty 1

## 2020-08-26 MED ORDER — METHOCARBAMOL 500 MG IVPB - SIMPLE MED
500.0000 mg | Freq: Four times a day (QID) | INTRAVENOUS | Status: DC | PRN
  Filled 2020-08-26: qty 50

## 2020-08-26 MED ORDER — FAMOTIDINE 20 MG PO TABS: 20.0000 mg | ORAL_TABLET | Freq: Two times a day (BID) | ORAL | Status: DC

## 2020-08-26 MED ORDER — IRRISEPT - 450ML BOTTLE WITH 0.05% CHG IN STERILE WATER, USP 99.95% OPTIME
TOPICAL | Status: DC | PRN
  Administered 2020-08-26: 450 mL

## 2020-08-26 MED ORDER — LIDOCAINE 2% (20 MG/ML) 5 ML SYRINGE
INTRAMUSCULAR | Status: DC | PRN
  Administered 2020-08-26: 100 mg via INTRAVENOUS

## 2020-08-26 MED ORDER — SUGAMMADEX SODIUM 200 MG/2ML IV SOLN
INTRAVENOUS | Status: DC | PRN
  Administered 2020-08-26: 200 mg via INTRAVENOUS

## 2020-08-26 MED ORDER — POLYETHYLENE GLYCOL 3350 17 G PO PACK: 17.0000 g | PACK | Freq: Every day | ORAL | 0 refills | Status: DC

## 2020-08-26 MED ORDER — MAGNESIUM CITRATE PO SOLN: 1.0000 | Freq: Once | ORAL | Status: DC | PRN

## 2020-08-26 MED ORDER — DEXAMETHASONE SODIUM PHOSPHATE 10 MG/ML IJ SOLN
INTRAMUSCULAR | Status: AC
  Filled 2020-08-26: qty 1

## 2020-08-26 MED ORDER — ACETAMINOPHEN 500 MG PO TABS
1000.0000 mg | ORAL_TABLET | Freq: Four times a day (QID) | ORAL | Status: DC
  Administered 2020-08-26 – 2020-08-27 (×2): 1000 mg via ORAL
  Filled 2020-08-26 (×2): qty 2

## 2020-08-26 MED ORDER — OXYCODONE HCL 5 MG PO TABS
5.0000 mg | ORAL_TABLET | ORAL | Status: DC | PRN
  Administered 2020-08-27: 5 mg via ORAL
  Filled 2020-08-26: qty 1

## 2020-08-26 SURGICAL SUPPLY — 63 items
ANCHOR NEEDLE 9/16 CIR SZ 8 (NEEDLE) IMPLANT
ANCHOR SL BIO 4.75 W/FIBERTAPE (Anchor) ×8 IMPLANT
ANCHOR TENDON ABSORBABLE (Anchor) ×4 IMPLANT
BLADE EXCALIBUR 4.0X13 (MISCELLANEOUS) ×4 IMPLANT
BLADE SURG SZ11 CARB STEEL (BLADE) ×2 IMPLANT
CANNULA ACUFO 5X76 (CANNULA) ×2 IMPLANT
CLEANER TIP ELECTROSURG 2X2 (MISCELLANEOUS) IMPLANT
COVER SURGICAL LIGHT HANDLE (MISCELLANEOUS) ×2 IMPLANT
COVER WAND RF STERILE (DRAPES) IMPLANT
DISSECTOR  3.8MM X 13CM (MISCELLANEOUS)
DISSECTOR 3.5MM X 13CM (MISCELLANEOUS) ×2 IMPLANT
DISSECTOR 3.8MM X 13CM (MISCELLANEOUS) IMPLANT
DRAPE POUCH INSTRU U-SHP 10X18 (DRAPES) ×2 IMPLANT
DRAPE STERI 35X30 U-POUCH (DRAPES) ×2 IMPLANT
DRSG AQUACEL AG 3.5X4 (GAUZE/BANDAGES/DRESSINGS) ×2 IMPLANT
DRSG AQUACEL AG ADV 3.5X 4 (GAUZE/BANDAGES/DRESSINGS) IMPLANT
DRSG AQUACEL AG ADV 3.5X 6 (GAUZE/BANDAGES/DRESSINGS) IMPLANT
DRSG PAD ABDOMINAL 8X10 ST (GAUZE/BANDAGES/DRESSINGS) IMPLANT
DURAPREP 26ML APPLICATOR (WOUND CARE) ×2 IMPLANT
ELECT NEEDLE TIP 2.8 STRL (NEEDLE) ×2 IMPLANT
ELECT REM PT RETURN 15FT ADLT (MISCELLANEOUS) ×2 IMPLANT
FILTER STRAW (MISCELLANEOUS) ×2 IMPLANT
GLOVE SRG 8 PF TXTR STRL LF DI (GLOVE) ×1 IMPLANT
GLOVE SURG POLYISO LF SZ7.5 (GLOVE) ×4 IMPLANT
GLOVE SURG POLYISO LF SZ8 (GLOVE) ×4 IMPLANT
GLOVE SURG UNDER POLY LF SZ7.5 (GLOVE) ×2 IMPLANT
GLOVE SURG UNDER POLY LF SZ8 (GLOVE) ×1
GOWN STRL REUS W/TWL XL LVL3 (GOWN DISPOSABLE) ×4 IMPLANT
KIT BASIN OR (CUSTOM PROCEDURE TRAY) ×4 IMPLANT
KIT TURNOVER KIT A (KITS) ×2 IMPLANT
MANIFOLD NEPTUNE II (INSTRUMENTS) ×2 IMPLANT
NEEDLE SCORPION MULTI FIRE (NEEDLE) ×4 IMPLANT
NEEDLE SPNL 18GX3.5 QUINCKE PK (NEEDLE) ×2 IMPLANT
PACK SHOULDER (CUSTOM PROCEDURE TRAY) ×2 IMPLANT
PASSER SUT SWANSON 36MM LOOP (INSTRUMENTS) ×2 IMPLANT
PENCIL SMOKE EVACUATOR (MISCELLANEOUS) IMPLANT
PORT APPOLLO RF 90DEGREE MULTI (SURGICAL WAND) ×2 IMPLANT
PROTECTOR NERVE ULNAR (MISCELLANEOUS) ×2 IMPLANT
RESTRAINT HEAD UNIVERSAL NS (MISCELLANEOUS) IMPLANT
SLING ARM IMMOBILIZER LRG (SOFTGOODS) ×2 IMPLANT
SLING ARM IMMOBILIZER MED (SOFTGOODS) IMPLANT
SLING ULTRA II L (ORTHOPEDIC SUPPLIES) IMPLANT
STRIP CLOSURE SKIN 1/2X4 (GAUZE/BANDAGES/DRESSINGS) ×2 IMPLANT
SUCTION FRAZIER HANDLE 12FR (TUBING) ×1
SUCTION TUBE FRAZIER 12FR DISP (TUBING) ×1 IMPLANT
SUT ETHIBOND NAB CT1 #1 30IN (SUTURE) IMPLANT
SUT ETHILON 4 0 PS 2 18 (SUTURE) ×2 IMPLANT
SUT FIBERWIRE #2 38 T-5 BLUE (SUTURE)
SUT PROLENE 3 0 PS 2 (SUTURE) ×2 IMPLANT
SUT TIGER TAPE 7 IN WHITE (SUTURE) IMPLANT
SUT VIC AB 0 CT1 27 (SUTURE) ×1
SUT VIC AB 0 CT1 27XBRD ANTBC (SUTURE) ×1 IMPLANT
SUT VIC AB 1-0 CT2 27 (SUTURE) IMPLANT
SUT VIC AB 2-0 CT2 27 (SUTURE) IMPLANT
SUT VICRYL 0 UR6 27IN ABS (SUTURE) ×2 IMPLANT
SUTURE FIBERWR #2 38 T-5 BLUE (SUTURE) IMPLANT
SYR 20ML LL LF (SYRINGE) ×2 IMPLANT
TAPE FIBER 2MM 7IN #2 BLUE (SUTURE) IMPLANT
TISSUE ARTHROFLEX THICK 4 (Tissue) ×2 IMPLANT
TOWEL OR 17X26 10 PK STRL BLUE (TOWEL DISPOSABLE) ×2 IMPLANT
TUBING ARTHROSCOPY IRRIG 16FT (MISCELLANEOUS) ×2 IMPLANT
TUBING CONNECTING 10 (TUBING) ×4 IMPLANT
WIPE CHG CHLORHEXIDINE 2% (PERSONAL CARE ITEMS) ×2 IMPLANT

## 2020-08-26 NOTE — Brief Op Note (Signed)
08/26/2020  10:53 AM  PATIENT:  [name]  65 y.o. male  PRE-OPERATIVE DIAGNOSIS:  Right shoulder rotator cuff tear  POST-OPERATIVE DIAGNOSIS:  Right shoulder rotator cuff tear  PROCEDURE:  Procedure(s) with comments: RIGHT SHOULDER MINI OPEN ROTATOR CUFF REPAIR AND SUBACROMIAL DECOMPRESSION WITH PATCH GRAFT (Right) - 90 MINS GENERAL WITH BLOCK  SURGEON:  Surgeon(s) and Role:    * Susa Day, MD - Primary  PHYSICIAN ASSISTANT:   ASSISTANTS: [place]   ANESTHESIA:   general  EBL:  20 mL   BLOOD ADMINISTERED:none  DRAINS: none   LOCAL MEDICATIONS USED:  MARCAINE     SPECIMEN:  No Specimen  DISPOSITION OF SPECIMEN:  N/A  COUNTS:  YES  TOURNIQUET:  * No tourniquets in log *  DICTATION: .Other Dictation: Dictation Number [id number]  PLAN OF CARE: Admit for overnight observation  PATIENT DISPOSITION:  PACU - hemodynamically stable.   Delay start of Pharmacological VTE agent (>24hrs) due to surgical blood loss or risk of bleeding: no

## 2020-08-26 NOTE — Discharge Instructions (Signed)
Aquacel dressing may remain in place until follow up. May shower with aquacel dressing in place. If the dressing becomes saturated or peels off, you may remove it and place a new dressing with gauze and tape which should be kept clean and dry and changed daily. °Use sling at times except when exercising or showering °No driving for 4-6 weeks °No lifting for 6 weeks operative arm °Pendulum exercises as instructed. °Ok to move wrist, elbow, and hand. °See Dr. Beane in 10-14 days. Take one aspirin per day with a meal if not on a blood thinner or allergic to aspirin. °

## 2020-08-26 NOTE — Op Note (Signed)
NAME: [name], RAHMANI MEDICAL RECORD NO: [id number] ACCOUNT NO: [id number] DATE OF BIRTH: 1955-08-10 FACILITY: [facility] LOCATION: [facility] PHYSICIAN: [name], MD  Operative Report   DATE OF PROCEDURE: 08/26/2020  PREOPERATIVE DIAGNOSIS:  Rotator cuff tear of the right shoulder, impingement syndrome.  POSTOPERATIVE DIAGNOSES:  Rotator cuff tear of the right shoulder, impingement syndrome.  PROCEDURE PERFORMED: 1.  Mini open rotator cuff repair utilizing SwiveLock suture anchors. 2.  Subacromial decompression. 3.  Application of Arthrex patch graft.  ANESTHESIA:  General.  ASSISTANT: Lacie Draft, PA  HISTORY:  This is a 65 year old male with a high-grade tear of the supraspinatus persistent disabling pain, refractory to conservative treatment.  He was indicated for repair of the torn tendon.  We discussed completing the tear, repairing the tendon  with SwiveLock suture anchors and possibly applying a patch graft for augmentation of the supraspinatus.  The patient is 66 and also an insulin-dependent diabetic.  Risks and benefits discussed including bleeding, infection, damage to neurovascular  structures, no change in symptoms, worsening symptoms, DVT, PE, anesthetic complication, etc.  TECHNIQUE:  The patient in supine beach chair position.  After induction of adequate general anesthesia and a scalene block, the right shoulder and upper extremity was prepped and draped in the usual sterile fashion.  A surgical marker was utilized to  delineate the acromion, AC joint and coracoid.  A 2 cm incision was made over the anterolateral aspect of the acromion.  Subcutaneous tissue was dissected.  Electrocautery was utilized to achieve hemostasis.  A 0.25% Marcaine with epinephrine was  infiltrated in subcutaneous tissue.  The raphae between the anterior and lateral heads of the deltoid were identified.  It was divided in line with the skin incision.  Self-retaining retractor was placed.   I entered the subacromial space. Digitally  lysing adhesions.  I used a 3 mm Kerrison to remove a small spur off the anterolateral aspect of the acromion performing an acromioplasty.  Partially released the CA ligament.  I excised hypertrophic bursa.  The supraspinatus was noted to be attenuated,  near full thickness tear at the insertion of the supraspinatus.  The infraspinatus appeared to be intact.  The subscap appeared to be intact.  I completed the tear by excising the lateral margin of the tendon with a 15 blade, debriding the edges of the  tendon back to vascular tissue.  Following this, I performed a trough in the greater tuberosity with bare rongeur and an AO elevator.  Following this, I mobilized the cuff on its articular and bursal surface copiously irrigating the glenohumeral joint in  the subacromial space.  I used an awl to place 2 pilot holes lateral to the articular surface approximately 1.5 cm apart anteriorly and posteriorly.  Good bone was obtained.  I placed 2 SwiveLock suture anchors with TigerTape suture in each.  Good  resistant to pullout.  I used the Scorpion suture passer to pass two leaflets posteriorly and two leaflets anteriorly.  With good portion of the tendon captured.  I then advanced this into the bed and secured it over the lateral aspect of the greater  tuberosity in a double row fashion.  After piloting 2 additional pilot holes with an awl and insertion into a second row of SwiveLocks with the arm at its side without undue tension.  Redundant suture was removed.  We had a full coverage.  There was some  central attenuation noted.  Therefore, I placed a 3 x 3 cm Arthrex patch graft  over the tendon of the repair site.  This was fixed with the Arthrex suture staple.  We placed a 10 staples securing the graft to the tendon.  This had excellent fixation of  the graft to the tendon.  Copiously irrigated the wound, ranged the shoulder.  The graft was intact.  We placed 4  anteriorly, 4 posteriorly and 2 medially and 1 centrally.  Next, copious irrigation.  No active bleeding was noted.  I then repaired the raphae with 0 Vicryl interrupted figure-of-eight suture, subcutaneous with 2-0 and the skin with Prolene.  Sterile dressing applied and patient was placed in abduction pillow.   Then, extubated without difficulty and transported to the recovery room in satisfactory condition.  The patient tolerated the procedure well.  There were no complications.  Assistant, Lacie Draft, PA was used for retraction and placement of the upper extremity and closure.  Minimal blood loss.   PUS D: 08/26/2020 10:53:34 am T: 08/26/2020 11:43:00 am  JOB: [id number]/ [id number]

## 2020-08-26 NOTE — Anesthesia Procedure Notes (Signed)
Anesthesia Regional Block: Interscalene brachial plexus block   Pre-Anesthetic Checklist: ,, timeout performed, Correct Patient, Correct Site, Correct Laterality, Correct Procedure, Correct Position, site marked, Risks and benefits discussed,  Surgical consent,  Pre-op evaluation,  At surgeon's request and post-op pain management  Laterality: Right  Prep: chloraprep       Needles:  Injection technique: Single-shot  Needle Type: Echogenic Stimulator Needle     Needle Length: 5cm  Needle Gauge: 22     Additional Needles:   Procedures:, nerve stimulator,,, ultrasound used (permanent image in chart),,,,   Nerve Stimulator or Paresthesia:  Response: hand, 0.45 mA,   Additional Responses:   Narrative:  Start time: 08/26/2020 8:00 AM End time: 08/26/2020 8:05 AM Injection made incrementally with aspirations every 5 mL.  Performed by: Personally  Anesthesiologist: Janeece Riggers, MD  Additional Notes: Functioning IV was confirmed and monitors were applied.  A 35mm 22ga Arrow echogenic stimulator needle was used. Sterile prep and drape,hand hygiene and sterile gloves were used. Ultrasound guidance: relevant anatomy identified, needle position confirmed, local anesthetic spread visualized around nerve(s)., vascular puncture avoided.  Image printed for medical record. Negative aspiration and negative test dose prior to incremental administration of local anesthetic. The patient tolerated the procedure well.

## 2020-08-26 NOTE — Transfer of Care (Signed)
Immediate Anesthesia Transfer of Care Note  Patient: [name]  Procedure(s) Performed: RIGHT SHOULDER MINI OPEN ROTATOR CUFF REPAIR AND SUBACROMIAL DECOMPRESSION WITH PATCH GRAFT (Right Shoulder)  Patient Location: PACU  Anesthesia Type:General  Level of Consciousness: awake and patient cooperative  Airway & Oxygen Therapy: Patient Spontanous Breathing and Patient connected to face mask  Post-op Assessment: Report given to RN and Post -op Vital signs reviewed and stable  Post vital signs: Reviewed and stable  Last Vitals:  Vitals Value Taken Time  BP 164/98 08/26/20 1045  Temp    Pulse 88 08/26/20 1046  Resp 22 08/26/20 1046  SpO2 97 % 08/26/20 1046  Vitals shown include unvalidated device data.  Last Pain:  Vitals:   08/26/20 0720  TempSrc:   PainSc: 0-No pain      Patients Stated Pain Goal: 8 (26/20/35 5974)  Complications: No complications documented.

## 2020-08-26 NOTE — Anesthesia Procedure Notes (Deleted)
Performed by: Eben Burow, CRNA

## 2020-08-26 NOTE — Anesthesia Procedure Notes (Signed)
Procedure Name: Intubation Date/Time: 08/26/2020 8:48 AM Performed by: Eben Burow, CRNA Pre-anesthesia Checklist: Patient identified, Emergency Drugs available, Suction available, Patient being monitored and Timeout performed Patient Re-evaluated:Patient Re-evaluated prior to induction Oxygen Delivery Method: Circle system utilized Preoxygenation: Pre-oxygenation with 100% oxygen Induction Type: IV induction Ventilation: Mask ventilation without difficulty Laryngoscope Size: Mac and 4 Grade View: Grade I Tube type: Oral Tube size: 7.5 mm Number of attempts: 1 Airway Equipment and Method: Stylet Placement Confirmation: ETT inserted through vocal cords under direct vision,  positive ETCO2 and breath sounds checked- equal and bilateral Secured at: 22 cm Tube secured with: Tape Dental Injury: Teeth and Oropharynx as per pre-operative assessment

## 2020-08-26 NOTE — Interval H&P Note (Signed)
History and Physical Interval Note:  08/26/2020 8:16 AM  [name]  has presented today for surgery, with the diagnosis of Right shoulder rotator cuff tear.  The various methods of treatment have been discussed with the patient and family. After consideration of risks, benefits and other options for treatment, the patient has consented to  Procedure(s) with comments: SHOULDER ARTHROSCOPY WITH MINI OPEN ROTATOR CUFF REPAIR AND SUBACROMIAL DECOMPRESSION (Right) - 90 MINS GENERAL WITH BLOCK as a surgical intervention.  The patient's history has been reviewed, patient examined, no change in status, stable for surgery.  I have reviewed the patient's chart and labs.  Questions were answered to the patient's satisfaction.     [name]  Discussed possible patch graft if needed.

## 2020-08-27 ENCOUNTER — Other Ambulatory Visit: Payer: Self-pay | Admitting: Cardiovascular Disease

## 2020-08-27 DIAGNOSIS — E1121 Type 2 diabetes mellitus with diabetic nephropathy: Secondary | ICD-10-CM | POA: Diagnosis not present

## 2020-08-27 DIAGNOSIS — M7541 Impingement syndrome of right shoulder: Secondary | ICD-10-CM | POA: Diagnosis not present

## 2020-08-27 DIAGNOSIS — Z87891 Personal history of nicotine dependence: Secondary | ICD-10-CM | POA: Diagnosis not present

## 2020-08-27 DIAGNOSIS — Z951 Presence of aortocoronary bypass graft: Secondary | ICD-10-CM | POA: Diagnosis not present

## 2020-08-27 DIAGNOSIS — M75120 Complete rotator cuff tear or rupture of unspecified shoulder, not specified as traumatic: Secondary | ICD-10-CM | POA: Diagnosis not present

## 2020-08-27 DIAGNOSIS — Z794 Long term (current) use of insulin: Secondary | ICD-10-CM | POA: Diagnosis not present

## 2020-08-27 DIAGNOSIS — M75121 Complete rotator cuff tear or rupture of right shoulder, not specified as traumatic: Secondary | ICD-10-CM | POA: Diagnosis not present

## 2020-08-27 LAB — CBC
HCT: 33 % — ABNORMAL LOW (ref 39.0–52.0)
Hemoglobin: 10.6 g/dL — ABNORMAL LOW (ref 13.0–17.0)
MCH: 27.7 pg (ref 26.0–34.0)
MCHC: 32.1 g/dL (ref 30.0–36.0)
MCV: 86.2 fL (ref 80.0–100.0)
Platelets: 235 10*3/uL (ref 150–400)
RBC: 3.83 MIL/uL — ABNORMAL LOW (ref 4.22–5.81)
RDW: 13.5 % (ref 11.5–15.5)
WBC: 10.9 10*3/uL — ABNORMAL HIGH (ref 4.0–10.5)
nRBC: 0 % (ref 0.0–0.2)

## 2020-08-27 LAB — BASIC METABOLIC PANEL
Anion gap: 10 (ref 5–15)
BUN: 34 mg/dL — ABNORMAL HIGH (ref 8–23)
CO2: 21 mmol/L — ABNORMAL LOW (ref 22–32)
Calcium: 8.7 mg/dL — ABNORMAL LOW (ref 8.9–10.3)
Chloride: 106 mmol/L (ref 98–111)
Creatinine, Ser: 2.83 mg/dL — ABNORMAL HIGH (ref 0.61–1.24)
GFR, Estimated: 24 mL/min — ABNORMAL LOW (ref >60)
Glucose, Bld: 175 mg/dL — ABNORMAL HIGH (ref 70–99)
Potassium: 3.7 mmol/L (ref 3.5–5.1)
Sodium: 137 mmol/L (ref 135–145)

## 2020-08-27 LAB — GLUCOSE, CAPILLARY: Glucose-Capillary: 179 mg/dL — ABNORMAL HIGH (ref 70–99)

## 2020-08-27 NOTE — Anesthesia Postprocedure Evaluation (Signed)
Anesthesia Post Note  Patient: [name]  Procedure(s) Performed: RIGHT SHOULDER MINI OPEN ROTATOR CUFF REPAIR AND SUBACROMIAL DECOMPRESSION WITH PATCH GRAFT (Right Shoulder)     Patient location during evaluation: PACU Anesthesia Type: General Level of consciousness: awake and alert Pain management: pain level controlled Vital Signs Assessment: post-procedure vital signs reviewed and stable Respiratory status: spontaneous breathing, nonlabored ventilation, respiratory function stable and patient connected to nasal cannula oxygen Cardiovascular status: blood pressure returned to baseline and stable Postop Assessment: no apparent nausea or vomiting Anesthetic complications: no   No complications documented.  Last Vitals:  Vitals:   08/27/20 0755 08/27/20 0921  BP:  134/73  Pulse:  77  Resp:  17  Temp:  36.4 C  SpO2: 96% 97%    Last Pain:  Vitals:   08/27/20 0921  TempSrc: Oral  PainSc:                  Aijah Lattner

## 2020-08-27 NOTE — Progress Notes (Signed)
Subjective: 1 Day Post-Op Procedure(s) (LRB): RIGHT SHOULDER MINI OPEN ROTATOR CUFF REPAIR AND SUBACROMIAL DECOMPRESSION WITH PATCH GRAFT (Right) Patient reports pain as mild.    Objective: Vital signs in last 24 hours: Temp:  [97.2 F (36.2 C)-98.5 F (36.9 C)] 97.6 F (36.4 C) (03/17 0921) Pulse Rate:  [77-95] 77 (03/17 0921) Resp:  [15-18] 17 (03/17 0921) BP: (134-179)/(73-98) 134/73 (03/17 0921) SpO2:  [92 %-99 %] 97 % (03/17 0921)  Intake/Output from previous day: 03/16 0701 - 03/17 0700 In: 2167.5 [P.O.:540; I.V.:1527.5; IV Piggyback:100] Out: 1170 [Urine:1150; Blood:20] Intake/Output this shift: Total I/O In: 240 [P.O.:240] Out: -   Recent Labs    08/27/20 0303  HGB 10.6*   Recent Labs    08/27/20 0303  WBC 10.9*  RBC 3.83*  HCT 33.0*  PLT 235   Recent Labs    08/27/20 0303  NA 137  K 3.7  CL 106  CO2 21*  BUN 34*  CREATININE 2.83*  GLUCOSE 175*  CALCIUM 8.7*   No results for input(s): LABPT, INR in the last 72 hours.  Neurologically intact ABD soft Neurovascular intact Sensation intact distally Intact pulses distally Dorsiflexion/Plantar flexion intact Incision: dressing C/D/I and no drainage No cellulitis present Compartment soft no sign of DVT   Assessment/Plan: 1 Day Post-Op Procedure(s) (LRB): RIGHT SHOULDER MINI OPEN ROTATOR CUFF REPAIR AND SUBACROMIAL DECOMPRESSION WITH PATCH GRAFT (Right) Advance diet Up with therapy D/C IV fluids D/C home Discussed DC instructions   Cecilie Kicks 08/27/2020, 11:05 AM

## 2020-08-27 NOTE — Discharge Summary (Signed)
Patient ID: [name] MRN: [id number] DOB/AGE: [birth year] 65 y.o.  Admit date: 08/26/2020 Discharge date: 08/27/2020  Admission Diagnoses:  Active Problems:   Complete rotator cuff tear   Discharge Diagnoses:  Same  Past Medical History:  Diagnosis Date  . Arthritis    hips shoulders  . CAD (coronary artery disease) 03/03/2019  . CHF (congestive heart failure) ([place]) 2020  . Hypertension   . Myocardial infarction ([place])   . Sleep apnea   . 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 male who was admitted 08/26/2020 for operative treatment of<principal problem not specified>. Patient has severe unremitting pain that affects sleep, daily activities, and work/hobbies. After pre-op clearance the patient was taken to the operating room on 08/26/2020 and underwent  Procedure(s): RIGHT SHOULDER MINI OPEN ROTATOR CUFF REPAIR AND SUBACROMIAL DECOMPRESSION WITH PATCH GRAFT.    Patient was given perioperative antibiotics:  Anti-infectives (From admission, onward)   Start     Dose/Rate Route Frequency Ordered Stop   08/26/20 1700  ceFAZolin (ANCEF) IVPB 2g/100 mL premix        2 g 200 mL/hr over 30 Minutes Intravenous Every 8 hours 08/26/20 1100 08/26/20 2203   08/26/20 0630  ceFAZolin (ANCEF) IVPB 2g/100 mL premix        2 g 200 mL/hr over 30 Minutes Intravenous On call to O.R. 08/26/20 2297 08/26/20 0855       Patient was given sequential compression devices, early ambulation, and chemoprophylaxis to prevent DVT.  Patient benefited maximally from hospital stay and there were no complications.    Recent vital signs:  Patient Vitals for the past 24 hrs:  BP Temp Temp src Pulse Resp SpO2  08/27/20 0921 134/73 97.6 F (36.4  C) Oral 77 17 97 %  08/27/20 0755 -- -- -- -- -- 96 %  08/27/20 0517 (!) 162/93 98.1 F (36.7 C) Oral 84 18 96 %  08/27/20 0044 (!) 158/91 98.2 F (36.8 C) Oral 83 18 95 %  08/26/20 2126 -- -- -- -- -- 95 %  08/26/20 2059 (!) 150/87 98.5 F (36.9 C) Oral 88 17 96 %  08/26/20 2058 (!) 150/87 98.5 F (36.9 C) Oral 86 17 96 %  08/26/20 1755 (!) 172/98 98 F (36.7 C) -- 95 17 97 %  08/26/20 1436 (!) 164/98 (!) 97.2 F (36.2 C) -- 86 17 99 %  08/26/20 1218 (!) 179/97 -- -- 81 15 96 %  08/26/20 1145 (!) 151/84 -- -- 79 17 92 %  08/26/20 1130 (!) 144/85 -- -- 78 17 92 %  08/26/20 1115 (!) 152/88 -- -- 80 16 98 %     Recent laboratory studies:  Recent Labs    08/27/20 0303  WBC 10.9*  HGB 10.6*  HCT 33.0*  PLT 235  NA 137  K 3.7  CL 106  CO2 21*  BUN 34*  CREATININE 2.83*  GLUCOSE 175*  CALCIUM 8.7*     Discharge Medications:   Allergies as of 08/27/2020      Reactions   Bee Venom Anaphylaxis   Atorvastatin    myalgia   Rosuvastatin    myalgias      Medication List    STOP taking these medications   HYDROcodone-acetaminophen 5-325 MG tablet  Commonly known as: NORCO/VICODIN     TAKE these medications   acetaminophen 500 MG tablet Commonly known as: TYLENOL Take 1,000 mg by mouth every 6 (six) hours as needed for moderate pain or headache.   albuterol 108 (90 Base) MCG/ACT inhaler Commonly known as: VENTOLIN HFA Inhale 2 puffs into the lungs every 6 (six) hours as needed. What changed: reasons to take this   amLODipine 10 MG tablet Commonly known as: NORVASC Take 1 tablet (10 mg total) by mouth daily.   aspirin EC 81 MG tablet Take 81 mg by mouth daily.   B-12 2500 MCG Tabs Take 2,500 mcg by mouth daily.   B-D ULTRAFINE III SHORT PEN 31G X 8 MM Misc Generic drug: Insulin Pen Needle 1 each by Does not apply route as directed.   COQ10 PO Take 1 capsule by mouth daily.   diphenhydramine-acetaminophen 25-500 MG Tabs tablet Commonly known as:  TYLENOL PM Take 1 tablet by mouth at bedtime as needed (pain/sleep).   docusate sodium 100 MG capsule Commonly known as: Colace Take 1 capsule (100 mg total) by mouth 2 (two) times daily as needed for mild constipation.   doxazosin 2 MG tablet Commonly known as: CARDURA Take 1 tablet (2 mg total) by mouth daily. What changed: when to take this   famotidine 20 MG tablet Commonly known as: PEPCID Take 1 tablet (20 mg total) by mouth 2 (two) times daily.   furosemide 40 MG tablet Commonly known as: LASIX TAKE 1 AND 1/2 TABLETS BY MOUTH DAILY What changed:   how much to take  how to take this  when to take this  additional instructions   Lantus SoloStar 100 UNIT/ML Solostar Pen Generic drug: insulin glargine Inject 25 Units into the skin every morning. What changed:   how much to take  when to take this   Magnesium 100 MG Caps Take 100 mg by mouth daily.   methocarbamol 500 MG tablet Commonly known as: Robaxin Take 1 tablet (500 mg total) by mouth every 6 (six) hours as needed for muscle spasms.   multivitamin with minerals Tabs tablet Take 1 tablet by mouth daily.   ondansetron 4 MG disintegrating tablet Commonly known as: Zofran ODT Take 1 tablet (4 mg total) by mouth every 8 (eight) hours as needed for nausea or vomiting.   oxyCODONE 5 MG immediate release tablet Commonly known as: Oxy IR/ROXICODONE Take 1-2 tablets (5-10 mg total) by mouth every 4 (four) hours as needed for severe pain.   OZEMPIC (0.25 OR 0.5 MG/DOSE) [place] Inject 0.5 mg into the skin every 7 (seven) days.   polyethylene glycol 17 g packet Commonly known as: MIRALAX / GLYCOLAX Take 17 g by mouth daily.   Repatha SureClick 867 MG/ML [place] Generic drug: Evolocumab Inject 140 mg into the skin every 14 (fourteen) days.   sertraline 100 MG tablet Commonly known as: ZOLOFT Take 1 tablet (100 mg total) by mouth daily.   valsartan 80 MG tablet Commonly known as: DIOVAN Take 1 tablet (80 mg  total) by mouth daily. What changed: when to take this   Vitamin D-3 125 MCG (5000 UT) Tabs Take 5,000 Units by mouth daily.       Diagnostic Studies: No results found.  Disposition: Discharge disposition: 01-Home or Self Care       Discharge Instructions    Call MD / Call 911   Complete by: As directed    If you experience chest pain or shortness of breath,  CALL 911 and be transported to the hospital emergency room.  If you develope a fever above 101 F, pus (white drainage) or increased drainage or redness at the wound, or calf pain, call your surgeon's office.   Constipation Prevention   Complete by: As directed    Drink plenty of fluids.  Prune juice may be helpful.  You may use a stool softener, such as Colace (over the counter) 100 mg twice a day.  Use MiraLax (over the counter) for constipation as needed.   Diet - low sodium heart healthy   Complete by: As directed    Increase activity slowly as tolerated   Complete by: As directed        Follow-up Information    Susa Day, MD Follow up in 2 week(s).   Specialty: Orthopedic Surgery Contact information: [address] [place] [address] [phone]                Signed: Cecilie Kicks 08/27/2020, 11:09 AM

## 2020-08-27 NOTE — Evaluation (Signed)
Occupational Therapy Evaluation Patient Details Name: [name] MRN: [id number] DOB: 16-Sep-1955 Today's Date: 08/27/2020    History of Present Illness Patient is a 65 year old male admitted 3/16 for R rotator cuff repair. PMH includes Type 2 diabetes mellitus with diabetic nephropathy, CAD, HTN   Clinical Impression   s/p mini open rotator cuff repair without functional use of right dominant upper extremity secondary to effects of surgery and interscalene block and shoulder precautions. Therapist provided education and instruction to patient in regards to exercises, precautions, positioning, donning upper extremity clothing and bathing while maintaining shoulder precautions, ice and edema management and donning/doffing sling. Patient verbalized understanding and demonstrated as needed. Patient needed assistance to donn underwear, pants, and provided with instruction on compensatory strategies to perform ADLs. Patient to follow up with MD for further therapy needs.      Follow Up Recommendations  Follow surgeon's recommendation for DC plan and follow-up therapies    Equipment Recommendations  None recommended by OT       Precautions / Restrictions Precautions Precautions: Shoulder Type of Shoulder Precautions: AROM shoulder NO, AROM elbow, wrist, hand ok, pendulums ok Shoulder Interventions: Shoulder sling/immobilizer;Off for dressing/bathing/exercises Precaution Booklet Issued: Yes (comment) Required Braces or Orthoses: Sling Restrictions Weight Bearing Restrictions: Yes RUE Weight Bearing: Non weight bearing      Mobility Bed Mobility Overal bed mobility: Needs Assistance Bed Mobility: Supine to Sit     Supine to sit: Min assist;HOB elevated     General bed mobility comments: for trunk support, patient reports will sleep in recliner at home    Transfers Overall transfer level: Modified independent Equipment used: None                  Balance Overall  balance assessment: No apparent balance deficits (not formally assessed)                                         ADL either performed or assessed with clinical judgement   ADL Overall ADL's : Needs assistance/impaired Eating/Feeding: Independent   Grooming: Modified independent;Sitting;Standing   Upper Body Bathing: Minimal assistance;Sitting;Standing   Lower Body Bathing: Minimal assistance;Sitting/lateral leans;Sit to/from stand   Upper Body Dressing : Minimal assistance;Sitting;Standing Upper Body Dressing Details (indicate cue type and reason): educated patient in technique, is familiar with donning R sleeve first. unable to perform at this time due to IV. Lower Body Dressing: Minimal assistance;Sitting/lateral leans;Sit to/from stand Lower Body Dressing Details (indicate cue type and reason): to initiate threading clothing Toilet Transfer: Modified Independent;Stand-pivot Toilet Transfer Details (indicate cue type and reason): to recliner, no physical assistance to power up to standing Toileting- Clothing Manipulation and Hygiene: Modified independent;Sitting/lateral lean;Sit to/from stand       Functional mobility during ADLs: Modified independent General ADL Comments: patient educated in compensatory strategies for ADLs in order to maintain shoulder precautions. verbalized and demo understanding as needed                  Pertinent Vitals/Pain Pain Assessment: Faces Faces Pain Scale: Hurts little more Pain Location: R shoulder Pain Descriptors / Indicators: Sore Pain Intervention(s): RN gave pain meds during session     Hand Dominance Right   Extremity/Trunk Assessment Upper Extremity Assessment Upper Extremity Assessment: RUE deficits/detail RUE Deficits / Details: + nerve block, intact ROM at wrist/hand RUE: Unable to fully assess due to immobilization  Lower Extremity Assessment Lower Extremity Assessment: Overall WFL for tasks assessed    Cervical / Trunk Assessment Cervical / Trunk Assessment: Normal   Communication Communication Communication: No difficulties   Cognition Arousal/Alertness: Awake/alert Behavior During Therapy: WFL for tasks assessed/performed Overall Cognitive Status: Within Functional Limits for tasks assessed                                        Exercises Exercises: Shoulder   Shoulder Instructions Shoulder Instructions Donning/doffing shirt without moving shoulder: Minimal assistance;Patient able to independently direct caregiver Method for sponge bathing under operated UE: Minimal assistance;Patient able to independently direct caregiver Donning/doffing sling/immobilizer: Moderate assistance;Patient able to independently direct caregiver Correct positioning of sling/immobilizer: Modified independent;Patient able to independently direct caregiver Pendulum exercises (written home exercise program): Patient able to independently direct caregiver ROM for elbow, wrist and digits of operated UE: Patient able to independently direct caregiver Sling wearing schedule (on at all times/off for ADL's): Patient able to independently direct caregiver Proper positioning of operated UE when showering: Patient able to independently direct caregiver Positioning of UE while sleeping: Patient able to independently direct caregiver    Home Living Family/patient expects to be discharged to:: Private residence Living Arrangements: Alone Available Help at Discharge: Friend(s) Type of Home: House Home Access: Stairs to enter [employer] of Steps: front 2 steps   Home Layout: Multi-level Alternate Level Stairs-Number of Steps: 6-7 steps to bedroom   Bathroom Shower/Tub: Occupational psychologist: [place] - [employer]          Prior Functioning/Environment Level of Independence: Independent                 OT Problem List:  Pain;Impaired UE functional use         OT Goals(Current goals can be found in the care plan section) Acute Rehab OT Goals Patient Stated Goal: home OT Goal Formulation: All assessment and education complete, DC therapy   AM-PAC OT "6 Clicks" Daily Activity     Outcome Measure Help from another person eating meals?: None Help from another person taking care of personal grooming?: None Help from another person toileting, which includes using toliet, bedpan, or urinal?: None Help from another person bathing (including washing, rinsing, drying)?: A Little Help from another person to put on and taking off regular upper body clothing?: A Little Help from another person to put on and taking off regular lower body clothing?: A Little 6 Click Score: 21   End of Session Equipment Utilized During Treatment: Other (comment) (sling) Nurse Communication: Mobility status;Precautions  Activity Tolerance: Patient tolerated treatment well Patient left: in chair;with call bell/phone within reach;with chair alarm set  OT Visit Diagnosis: Pain Pain - Right/Left: Right Pain - part of body: Shoulder                Time: 8921-1941 OT Time Calculation (min): 46 min Charges:  OT General Charges $OT Visit: 1 Visit OT Evaluation $OT Eval Low Complexity: 1 Low OT Treatments $Self Care/Home Management : 23-37 mins  Delbert Phenix OT OT pager: [phone]  Rosemary Holms 08/27/2020, 9:21 AM

## 2020-08-28 ENCOUNTER — Encounter (HOSPITAL_COMMUNITY): Payer: Self-pay | Admitting: Specialist

## 2020-09-01 ENCOUNTER — Other Ambulatory Visit: Payer: Self-pay

## 2020-09-01 ENCOUNTER — Ambulatory Visit (INDEPENDENT_AMBULATORY_CARE_PROVIDER_SITE_OTHER): Payer: Medicare HMO | Admitting: Pharmacist

## 2020-09-01 ENCOUNTER — Ambulatory Visit: Payer: Medicaid Other | Admitting: Family Medicine

## 2020-09-01 VITALS — BP 142/84 | HR 83 | Resp 16 | Ht 70.0 in | Wt 215.9 lb

## 2020-09-01 DIAGNOSIS — I160 Hypertensive urgency: Secondary | ICD-10-CM | POA: Diagnosis not present

## 2020-09-01 NOTE — Patient Instructions (Addendum)
Return for a follow up appointment in as needed (Will call for [name] follow up)  Check your blood pressure at home daily (if able) and keep record of the readings.  Take your BP meds as follows: *NO MEDICATION CHANGES*  Bring all of your meds, your BP cuff and your record of home blood pressures to your next appointment.  Exercise as you're able, try to walk approximately 30 minutes per day.  Keep salt intake to a minimum, especially watch canned and prepared boxed foods.  Eat more fresh fruits and vegetables and fewer canned items.  Avoid eating in fast food restaurants.    HOW TO TAKE YOUR BLOOD PRESSURE: . Rest 5 minutes before taking your blood pressure. .  Don't smoke or drink caffeinated beverages for at least 30 minutes before. . Take your blood pressure before (not after) you eat. . Sit comfortably with your back supported and both feet on the floor (don't cross your legs). . Elevate your arm to heart level on a table or a desk. . Use the proper sized cuff. It should fit smoothly and snugly around your bare upper arm. There should be enough room to slip a fingertip under the cuff. The bottom edge of the cuff should be 1 inch above the crease of the elbow. . Ideally, take 3 measurements at one sitting and record the average.

## 2020-09-01 NOTE — Progress Notes (Signed)
HPI:  [name] is a 65 y.o. male patient of Dr. Oval Linsey , with a PMH below who presents today for follow up in the advanced hypertension clinic. Amlodipine dose was increased from 5mg  to 10mg  during last OV. Noted patient had rotator cuff surgery on 08/26/2020.  Patient presents for follow up in wheel chair and accompany by his wife, Still experiencing fatigues and dizziness, but part of his balance problems are related to loosing part of his vision in one eye. Pain from surgery is under constrol, but he is experiencing constipation since discharge from hospital.   Click here for Secondary HTN Screening Tool (Then Refresh Note) Secondary Cause Data Comments  Herbals/Drugs  Screened:  01/20/2020 Not taking         Renovascular HTN             Sleep Apnea  Screened:  01/20/2020 No History Never evaluated  Patient denies snoring, states sleeps easily w/o concer  Hyperthyroidism  Screened:  04/29/2018 No History Previously ruled out  TSH 2.11  Hypothyroidism   Screened:  04/29/2018 No History Previously ruled out   TSH 2.11  Hyperaldosteronism             Pheochromocytoma  Screened:  01/20/2020 No History    Testing not indicated  Cushing's Syndrome  Screened:  01/20/2020 No History     Testing not indicated  Hyperparathyroidism             Coarctation of Aorta            Chemotherapeutic Agents        Compliance               Past Medical History: ASCVD S/p CABG x 4, PAD -   CHF Chronic systolic and diastolic 9/51 echo EF at 88-41% up from 66-06%, grade 1 diastolic dysfunction  hyperlipidemia 7/21:  TC 265, TG 234, HDL 44, LDL 177 - no meds  DM2 10/20: A1c 8.2 - glipizide 2.5 mg qd, Lantus qd  CKD3 7/21:  SCr 2.14, GFR 32 CrCl 50     Blood Pressure Goal:  130/80  Current Medications: amlodipine 10 mg daily doxazosin 2 mg qhs  furosemide 40mg  daily Valsartan 80mg  daily  Intolerances: lisinopril, hctz, beta blockers, hydralazine  Family Hx:  mother had colon cancer, died at 2, no heart disease; father died at 26's, valve, CHF  Social Hx: former smoker (quit 25+ yrs ago, 2 ppd x 10-15 yrs); no alcohol  Diet: no processed food; more chicken, eggs; some vegetables; increased protein, no snacks  Exercise: limited by DOE, was given information on PREP  Home BP readings: none provided today  Labs: 7/21:  Na 139, K 4.3, Glu 142, BUN 29, SCr 2.17, GFR 32  12/21:  Na 136, K 4.3, Glu 169, BUN 48, SCr 3.37, GFR 20  Wt Readings from Last 3 Encounters:  09/01/20 215 lb 14.4 oz (97.9 kg)  08/26/20 200 lb (90.7 kg)  08/21/20 200 lb (90.7 kg)   BP Readings from Last 3 Encounters:  09/01/20 (!) 142/84  08/27/20 134/73  08/21/20 (!) 149/85   Pulse Readings from Last 3 Encounters:  09/01/20 83  08/27/20 77  08/21/20 86    Current Outpatient Medications  Medication Sig Dispense Refill  . acetaminophen (TYLENOL) 500 MG tablet Take 1,000 mg by mouth every 6 (six) hours as needed for moderate pain or headache.     . albuterol (VENTOLIN HFA) 108 (90 Base) MCG/ACT  inhaler Inhale 2 puffs into the lungs every 6 (six) hours as needed. (Patient taking differently: Inhale 2 puffs into the lungs every 6 (six) hours as needed for wheezing or shortness of breath.) 18 g 5  . amLODipine (NORVASC) 10 MG tablet Take 1 tablet (10 mg total) by mouth daily. 90 tablet 3  . aspirin EC 81 MG tablet Take 81 mg by mouth daily.    . Cholecalciferol (VITAMIN D-3) 125 MCG (5000 UT) TABS Take 5,000 Units by mouth daily.    . Coenzyme Q10 (COQ10 PO) Take 1 capsule by mouth daily.    . Cyanocobalamin (B-12) 2500 MCG TABS Take 2,500 mcg by mouth daily.    . diphenhydramine-acetaminophen (TYLENOL PM) 25-500 MG TABS tablet Take 1 tablet by mouth at bedtime as needed (pain/sleep).    . docusate sodium (COLACE) 100 MG capsule Take 1 capsule (100 mg total) by mouth 2 (two) times daily as needed for mild constipation. 30 capsule 1  . doxazosin (CARDURA) 2 MG tablet Take  1 tablet (2 mg total) by mouth daily. (Patient taking differently: Take 2 mg by mouth every evening.) 90 tablet 3  . Evolocumab (REPATHA SURECLICK) 315 MG/ML SOAJ Inject 140 mg into the skin every 14 (fourteen) days. 2 mL 11  . famotidine (PEPCID) 20 MG tablet Take 1 tablet (20 mg total) by mouth 2 (two) times daily. 30 tablet 0  . furosemide (LASIX) 40 MG tablet TAKE 1 AND 1/2 TABLETS BY MOUTH DAILY (Patient taking differently: Take 40 mg by mouth daily.) 135 tablet 1  . insulin glargine (LANTUS SOLOSTAR) 100 UNIT/ML Solostar Pen Inject 25 Units into the skin every morning. (Patient taking differently: Inject 18 Units into the skin daily.) 5 pen PRN  . Insulin Pen Needle (B-D ULTRAFINE III SHORT PEN) 31G X 8 MM MISC 1 each by Does not apply route as directed. 100 each 3  . Magnesium 100 MG CAPS Take 100 mg by mouth daily.    . methocarbamol (ROBAXIN) 500 MG tablet Take 1 tablet (500 mg total) by mouth every 6 (six) hours as needed for muscle spasms. 40 tablet 1  . Multiple Vitamin (MULTIVITAMIN WITH MINERALS) TABS tablet Take 1 tablet by mouth daily.    . ondansetron (ZOFRAN ODT) 4 MG disintegrating tablet Take 1 tablet (4 mg total) by mouth every 8 (eight) hours as needed for nausea or vomiting. 20 tablet 0  . oxyCODONE (OXY IR/ROXICODONE) 5 MG immediate release tablet Take 1-2 tablets (5-10 mg total) by mouth every 4 (four) hours as needed for severe pain. 40 tablet 0  . polyethylene glycol (MIRALAX / GLYCOLAX) 17 g packet Take 17 g by mouth daily. 14 each 0  . Semaglutide (OZEMPIC, 0.25 OR 0.5 MG/DOSE, [place]) Inject 0.5 mg into the skin every 7 (seven) days.    Marland Kitchen sertraline (ZOLOFT) 100 MG tablet Take 1 tablet (100 mg total) by mouth daily. 90 tablet 1  . valsartan (DIOVAN) 80 MG tablet TAKE 1 TABLET BY MOUTH EVERY DAY 90 tablet 2   No current facility-administered medications for this visit.    Allergies  Allergen Reactions  . Bee Venom Anaphylaxis  . Atorvastatin     myalgia  .  Rosuvastatin     myalgias    Past Medical History:  Diagnosis Date  . Arthritis    hips shoulders  . CAD (coronary artery disease) 03/03/2019  . CHF (congestive heart failure) ([place]) 2020  . Hypertension   . Myocardial infarction ([place])   .  Sleep apnea   . Type 2 diabetes mellitus with diabetic nephropathy ([place]) 04/23/2019    Blood pressure (!) 142/84, pulse 83, resp. rate 16, height 5\' 10"  (1.778 m), weight 215 lb 14.4 oz (97.9 kg), SpO2 95 %.  Hypertensive urgency Blood pressure remains above goal, but greatly improved in the last few months. We started BP management with BP of 192/104. Patient is currently consistence in 140s. He still experiencing some fatigue and balance issues, but his balanced issues are in part related to visual issues. Denies falls or any other complications. Also noted good complaince with OSA follow up.  Will keep current therapy as prescribed and schedule follow up with [name] in 4-6 weeks. I instructed patient and spouse to contact orthopedic provider that preformed his surgery to discuss options to manage his post-op constipation issues.  Elidia Bonenfant Rodriguez-Guzman PharmD, BCPS, CPP [place] [id number] 09/10/2020 11:47 AM

## 2020-09-02 ENCOUNTER — Telehealth: Payer: Self-pay | Admitting: *Deleted

## 2020-09-02 NOTE — Telephone Encounter (Signed)
-----   Message from Harrington Challenger, RPH-CPP sent at 09/01/2020  2:25 PM EDT ----- Regarding: ADV HTN Mr Kilker is due to follow up with [name] in 6-8 weeks.

## 2020-09-02 NOTE — Telephone Encounter (Signed)
Scheduled 5/4 at 1:45

## 2020-09-07 DIAGNOSIS — Z7984 Long term (current) use of oral hypoglycemic drugs: Secondary | ICD-10-CM | POA: Diagnosis not present

## 2020-09-07 DIAGNOSIS — E113511 Type 2 diabetes mellitus with proliferative diabetic retinopathy with macular edema, right eye: Secondary | ICD-10-CM | POA: Diagnosis not present

## 2020-09-07 DIAGNOSIS — Z794 Long term (current) use of insulin: Secondary | ICD-10-CM | POA: Diagnosis not present

## 2020-09-07 DIAGNOSIS — H25813 Combined forms of age-related cataract, bilateral: Secondary | ICD-10-CM | POA: Diagnosis not present

## 2020-09-10 ENCOUNTER — Encounter: Payer: Self-pay | Admitting: Pharmacist

## 2020-09-10 DIAGNOSIS — H4313 Vitreous hemorrhage, bilateral: Secondary | ICD-10-CM | POA: Diagnosis not present

## 2020-09-10 DIAGNOSIS — E113511 Type 2 diabetes mellitus with proliferative diabetic retinopathy with macular edema, right eye: Secondary | ICD-10-CM | POA: Diagnosis not present

## 2020-09-10 DIAGNOSIS — E113592 Type 2 diabetes mellitus with proliferative diabetic retinopathy without macular edema, left eye: Secondary | ICD-10-CM | POA: Diagnosis not present

## 2020-09-10 NOTE — Assessment & Plan Note (Signed)
Blood pressure remains above goal, but greatly improved in the last few months. We started BP management with BP of 192/104. Patient is currently consistence in 140s. He still experiencing some fatigue and balance issues, but his balanced issues are in part related to visual issues. Denies falls or any other complications. Also noted good complaince with OSA follow up.  Will keep current therapy as prescribed and schedule follow up with [name] in 4-6 weeks. I instructed patient and spouse to contact orthopedic provider that preformed his surgery to discuss options to manage his post-op constipation issues.

## 2020-09-15 DIAGNOSIS — H4313 Vitreous hemorrhage, bilateral: Secondary | ICD-10-CM | POA: Diagnosis not present

## 2020-09-15 DIAGNOSIS — E113592 Type 2 diabetes mellitus with proliferative diabetic retinopathy without macular edema, left eye: Secondary | ICD-10-CM | POA: Diagnosis not present

## 2020-09-15 DIAGNOSIS — E113511 Type 2 diabetes mellitus with proliferative diabetic retinopathy with macular edema, right eye: Secondary | ICD-10-CM | POA: Diagnosis not present

## 2020-09-16 DIAGNOSIS — I129 Hypertensive chronic kidney disease with stage 1 through stage 4 chronic kidney disease, or unspecified chronic kidney disease: Secondary | ICD-10-CM | POA: Diagnosis not present

## 2020-09-16 DIAGNOSIS — N1832 Chronic kidney disease, stage 3b: Secondary | ICD-10-CM | POA: Diagnosis not present

## 2020-09-16 DIAGNOSIS — E1122 Type 2 diabetes mellitus with diabetic chronic kidney disease: Secondary | ICD-10-CM | POA: Diagnosis not present

## 2020-09-16 DIAGNOSIS — R5383 Other fatigue: Secondary | ICD-10-CM | POA: Diagnosis not present

## 2020-09-22 DIAGNOSIS — Z4789 Encounter for other orthopedic aftercare: Secondary | ICD-10-CM | POA: Diagnosis not present

## 2020-09-22 DIAGNOSIS — M25512 Pain in left shoulder: Secondary | ICD-10-CM | POA: Diagnosis not present

## 2020-09-22 DIAGNOSIS — R531 Weakness: Secondary | ICD-10-CM | POA: Diagnosis not present

## 2020-09-22 DIAGNOSIS — M25511 Pain in right shoulder: Secondary | ICD-10-CM | POA: Diagnosis not present

## 2020-09-24 DIAGNOSIS — Z4789 Encounter for other orthopedic aftercare: Secondary | ICD-10-CM | POA: Diagnosis not present

## 2020-09-24 DIAGNOSIS — M25512 Pain in left shoulder: Secondary | ICD-10-CM | POA: Diagnosis not present

## 2020-09-24 DIAGNOSIS — R531 Weakness: Secondary | ICD-10-CM | POA: Diagnosis not present

## 2020-09-24 DIAGNOSIS — M25511 Pain in right shoulder: Secondary | ICD-10-CM | POA: Diagnosis not present

## 2020-09-28 ENCOUNTER — Ambulatory Visit (INDEPENDENT_AMBULATORY_CARE_PROVIDER_SITE_OTHER): Payer: Medicare HMO | Admitting: Family Medicine

## 2020-09-28 ENCOUNTER — Other Ambulatory Visit: Payer: Self-pay

## 2020-09-28 VITALS — BP 138/86 | HR 86 | Temp 97.5°F | Ht 70.0 in | Wt 207.0 lb

## 2020-09-28 DIAGNOSIS — I10 Essential (primary) hypertension: Secondary | ICD-10-CM | POA: Diagnosis not present

## 2020-09-28 DIAGNOSIS — Z125 Encounter for screening for malignant neoplasm of prostate: Secondary | ICD-10-CM | POA: Diagnosis not present

## 2020-09-28 DIAGNOSIS — E1121 Type 2 diabetes mellitus with diabetic nephropathy: Secondary | ICD-10-CM

## 2020-09-28 DIAGNOSIS — N183 Chronic kidney disease, stage 3 unspecified: Secondary | ICD-10-CM | POA: Diagnosis not present

## 2020-09-28 NOTE — Patient Instructions (Signed)
Wellness visit at end of July and august

## 2020-09-28 NOTE — Progress Notes (Signed)
   Subjective:    Patient ID: [name], male    DOB: 06-09-1956, 65 y.o.   MRN: [id number]  Hypertension This is a chronic problem. The problem is uncontrolled. Pertinent negatives include no chest pain, headaches or shortness of breath. Risk factors for coronary artery disease include diabetes mellitus. Treatments tried: amlodipine, valsartan.   Essential hypertension, benign  Diabetic nephropathy associated with type 2 diabetes mellitus ([place])  CRI (chronic renal insufficiency), stage 3 (moderate) (H[facility])  Screening for prostate cancer - Plan: PSA     Review of Systems  Constitutional: Negative for activity change, fatigue and fever.  HENT: Negative for congestion and rhinorrhea.   Respiratory: Negative for cough and shortness of breath.   Cardiovascular: Negative for chest pain and leg swelling.  Gastrointestinal: Negative for abdominal pain, diarrhea and nausea.  Genitourinary: Negative for dysuria and hematuria.  Neurological: Negative for weakness and headaches.  Psychiatric/Behavioral: Negative for agitation and behavioral problems.       Objective:   Physical Exam Vitals reviewed.  Cardiovascular:     Rate and Rhythm: Normal rate and regular rhythm.     Heart sounds: Normal heart sounds. No murmur heard.   Pulmonary:     Effort: Pulmonary effort is normal.     Breath sounds: Normal breath sounds.  Lymphadenopathy:     Cervical: No cervical adenopathy.  Neurological:     Mental Status: He is alert.  Psychiatric:        Behavior: Behavior normal.            Assessment & Plan:  1. Essential hypertension, benign Blood pressure initially elevated when he came in but after sitting it came down to a reasonable number encouragement with compliance with medications discussed as well as diet and activity  2. Diabetic nephropathy associated with type 2 diabetes mellitus ([place]) Patient follows up with endocrinology on a regular basis denies any low sugar  spells  3. CRI (chronic renal insufficiency), stage 3 (moderate) (H[facility]) Follows up with kidney doctor on a regular basis takes in adequate liquids  4. Screening for prostate cancer To do his labs in the summer and follow-up for a wellness check - PSA  Moods overall are doing well considering all the health problems he is going. States he will be having cataract surgery later this year I have encouraged him to use a walking staff when he is outside walking

## 2020-09-29 DIAGNOSIS — M25511 Pain in right shoulder: Secondary | ICD-10-CM | POA: Diagnosis not present

## 2020-09-29 DIAGNOSIS — R531 Weakness: Secondary | ICD-10-CM | POA: Diagnosis not present

## 2020-09-29 DIAGNOSIS — M25512 Pain in left shoulder: Secondary | ICD-10-CM | POA: Diagnosis not present

## 2020-09-29 DIAGNOSIS — Z4789 Encounter for other orthopedic aftercare: Secondary | ICD-10-CM | POA: Diagnosis not present

## 2020-09-29 IMAGING — DX DG CHEST 2V
2 series · 2 of 2 positions shown · non-contrast
Comparison: 11/12/2019

CLINICAL DATA: Dyspnea, generalized weakness

EXAM:
CHEST - 2 VIEW

[chest pa]
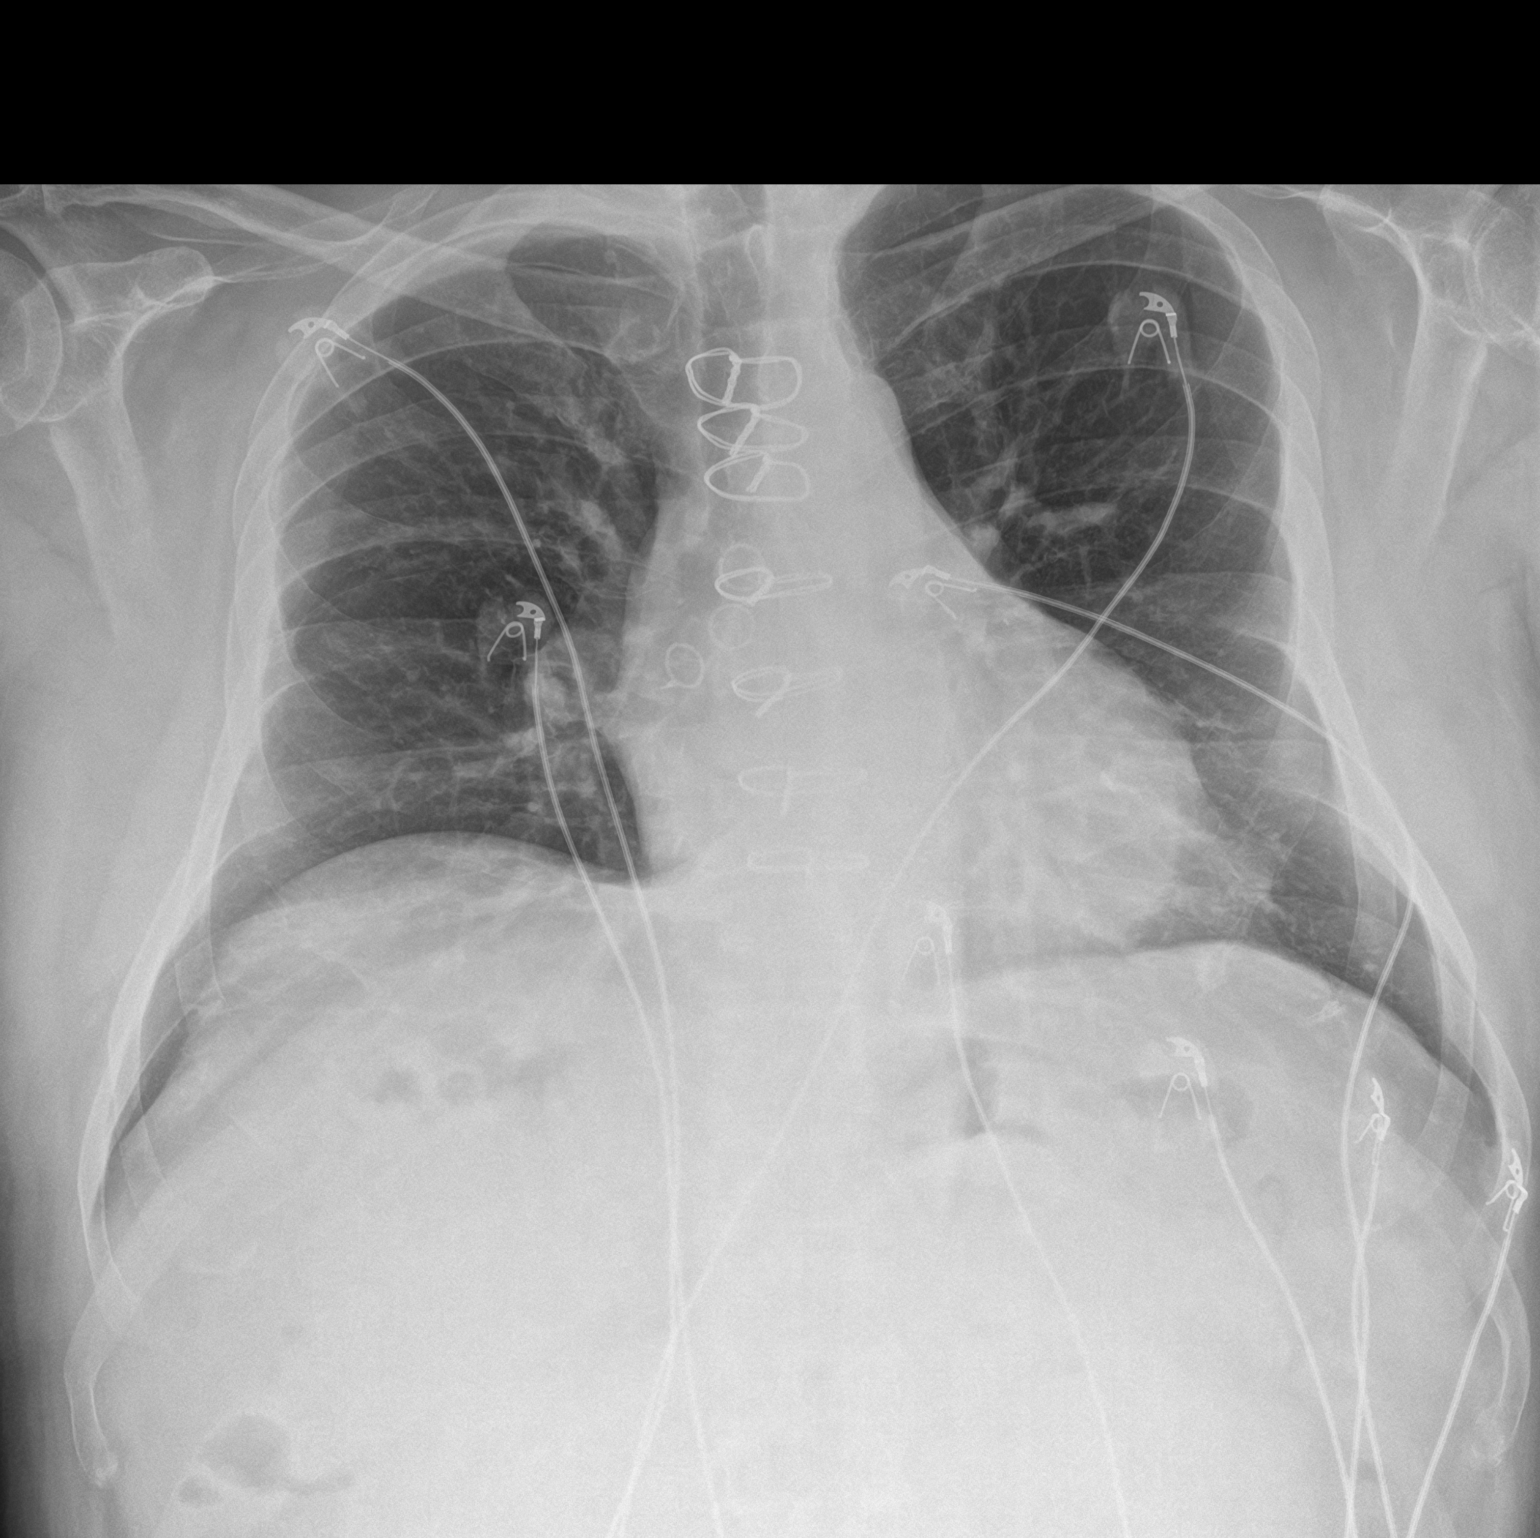

[chest lat]
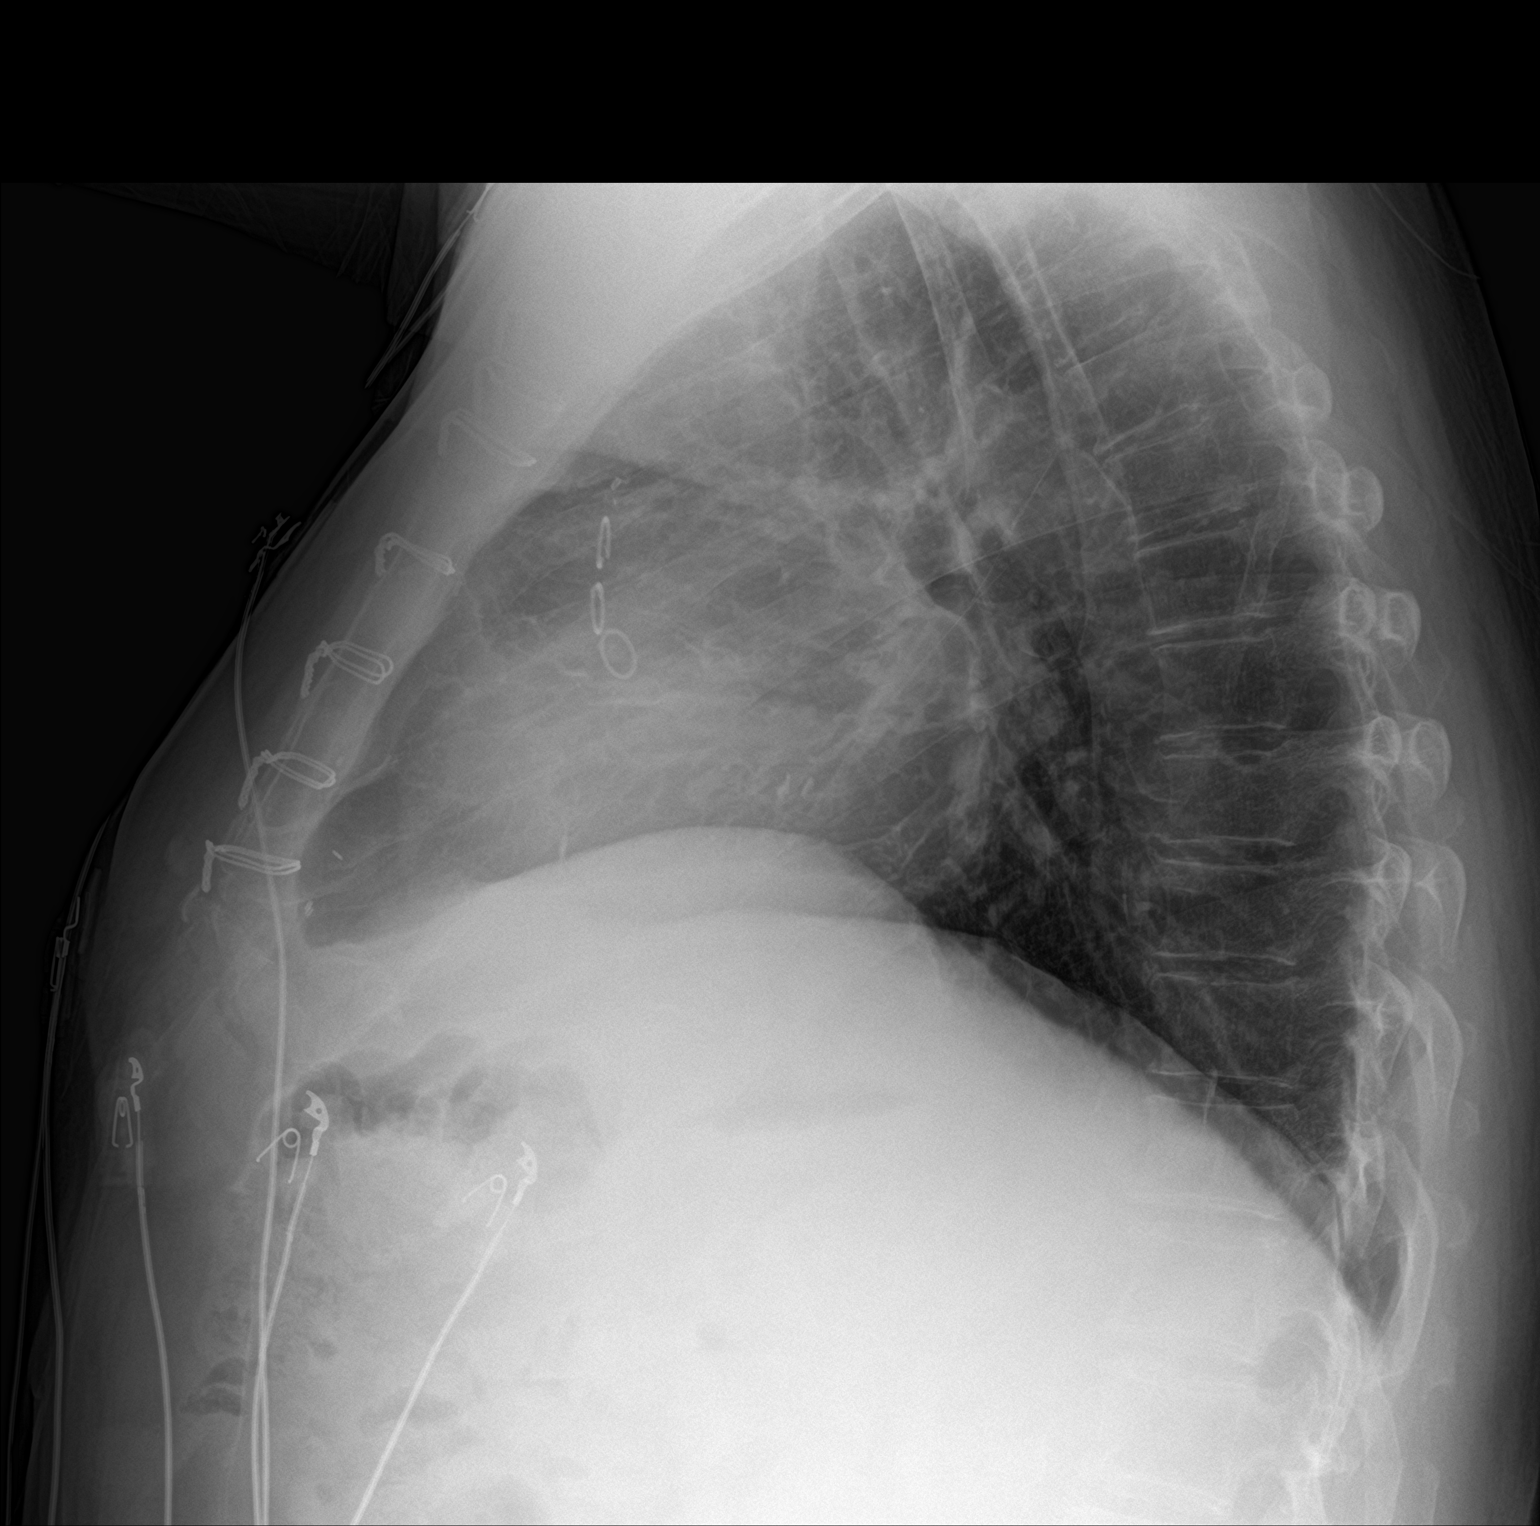

[2 of 2 positions shown; findings below may reference images not displayed]

FINDINGS: Lung volumes are small. Patchy lingular atelectasis or infiltrate is
present, slightly progressive since prior examination. The lungs are
otherwise clear. No pneumothorax or pleural effusion. Coronary
artery bypass grafting has been performed. Cardiac size within
normal limits. No acute bone abnormality.
IMPRESSION: Patchy lingular atelectasis or infiltrate.

## 2020-10-01 DIAGNOSIS — Z4789 Encounter for other orthopedic aftercare: Secondary | ICD-10-CM | POA: Diagnosis not present

## 2020-10-01 DIAGNOSIS — M25511 Pain in right shoulder: Secondary | ICD-10-CM | POA: Diagnosis not present

## 2020-10-01 DIAGNOSIS — R531 Weakness: Secondary | ICD-10-CM | POA: Diagnosis not present

## 2020-10-01 DIAGNOSIS — M25512 Pain in left shoulder: Secondary | ICD-10-CM | POA: Diagnosis not present

## 2020-10-05 ENCOUNTER — Encounter (INDEPENDENT_AMBULATORY_CARE_PROVIDER_SITE_OTHER): Payer: Self-pay

## 2020-10-06 ENCOUNTER — Ambulatory Visit (INDEPENDENT_AMBULATORY_CARE_PROVIDER_SITE_OTHER): Payer: Medicare HMO | Admitting: Internal Medicine

## 2020-10-06 ENCOUNTER — Other Ambulatory Visit (INDEPENDENT_AMBULATORY_CARE_PROVIDER_SITE_OTHER): Payer: Self-pay | Admitting: *Deleted

## 2020-10-06 ENCOUNTER — Encounter (INDEPENDENT_AMBULATORY_CARE_PROVIDER_SITE_OTHER): Payer: Self-pay | Admitting: Internal Medicine

## 2020-10-06 ENCOUNTER — Other Ambulatory Visit: Payer: Self-pay

## 2020-10-06 ENCOUNTER — Encounter (INDEPENDENT_AMBULATORY_CARE_PROVIDER_SITE_OTHER): Payer: Self-pay | Admitting: *Deleted

## 2020-10-06 DIAGNOSIS — D638 Anemia in other chronic diseases classified elsewhere: Secondary | ICD-10-CM | POA: Insufficient documentation

## 2020-10-06 DIAGNOSIS — K219 Gastro-esophageal reflux disease without esophagitis: Secondary | ICD-10-CM | POA: Diagnosis not present

## 2020-10-06 DIAGNOSIS — R531 Weakness: Secondary | ICD-10-CM | POA: Diagnosis not present

## 2020-10-06 DIAGNOSIS — M25511 Pain in right shoulder: Secondary | ICD-10-CM | POA: Diagnosis not present

## 2020-10-06 DIAGNOSIS — Z4789 Encounter for other orthopedic aftercare: Secondary | ICD-10-CM | POA: Diagnosis not present

## 2020-10-06 DIAGNOSIS — M25512 Pain in left shoulder: Secondary | ICD-10-CM | POA: Diagnosis not present

## 2020-10-06 MED ORDER — FAMOTIDINE 20 MG PO TABS: 20.0000 mg | ORAL_TABLET | Freq: Every day | ORAL | 0 refills | Status: DC

## 2020-10-06 MED ORDER — PANTOPRAZOLE SODIUM 40 MG PO TBEC: 40.0000 mg | DELAYED_RELEASE_TABLET | Freq: Every day | ORAL | 5 refills | Status: DC

## 2020-10-06 NOTE — Progress Notes (Addendum)
Presenting complaint;  Chronic heartburn.  Medication not helping.  Database and subjective:  Patient is 65 year old Caucasian male who is here for scheduled visit regarding his GERD symptoms.  He was last seen for screening colonoscopy in January 2021 with removal of 2 small polyps and these were tubular adenomas.  He also had pancolonic diverticulosis and external hemorrhoids. Patient presents with daily heartburn which usually occurs in the evening.  He also has frequent nausea and infrequent vomiting.  He states he vomits at least 2-3 times in a month.  He also reports regurgitation and aspiration but likely he has not required any antibiotics.  He denies dysphagia.  He is watching his diet.  He only drinks 1 cup of coffee and drinks water rest of the day.  He is a slow eater.  He says famotidine is not working anymore.  No history of hematemesis or melena.  His appetite is good.  He has been watching his diet and has managed to lose 27 pounds in the last 2 years.  He is taking Colace and his bowels move daily without any difficulty. He eats his supper around 4 PM and goes to bed between 8:30 PM and 9 PM. He had rotator cuff repair on 08/27/2018.  He feels he is having a nice recovery. He was diagnosed with diabetes mellitus when he was in his 1s.  Most recent A1c was 6.9 g. He still has his gallbladder in situ.   Current Medications: Outpatient Encounter Medications as of 10/06/2020  Medication Sig  . acetaminophen (TYLENOL) 500 MG tablet Take 1,000 mg by mouth every 6 (six) hours as needed for moderate pain or headache.   . albuterol (VENTOLIN HFA) 108 (90 Base) MCG/ACT inhaler Inhale 2 puffs into the lungs every 6 (six) hours as needed. (Patient taking differently: Inhale 2 puffs into the lungs every 6 (six) hours as needed for wheezing or shortness of breath.)  . amLODipine (NORVASC) 10 MG tablet Take 1 tablet (10 mg total) by mouth daily.  Marland Kitchen aspirin EC 81 MG tablet Take 81 mg by mouth  daily.  . Cholecalciferol (VITAMIN D-3) 125 MCG (5000 UT) TABS Take 5,000 Units by mouth daily.  . Coenzyme Q10 (COQ10 PO) Take 1 capsule by mouth daily.  . Cyanocobalamin (B-12) 2500 MCG TABS Take 2,500 mcg by mouth daily.  . diphenhydramine-acetaminophen (TYLENOL PM) 25-500 MG TABS tablet Take 1 tablet by mouth at bedtime as needed (pain/sleep).  . docusate sodium (COLACE) 100 MG capsule Take 1 capsule (100 mg total) by mouth 2 (two) times daily as needed for mild constipation.  Marland Kitchen doxazosin (CARDURA) 2 MG tablet Take 1 tablet (2 mg total) by mouth daily. (Patient taking differently: Take 2 mg by mouth every evening.)  . Evolocumab (REPATHA SURECLICK) 852 MG/ML SOAJ Inject 140 mg into the skin every 14 (fourteen) days.  . famotidine (PEPCID) 20 MG tablet Take 1 tablet (20 mg total) by mouth 2 (two) times daily.  . furosemide (LASIX) 40 MG tablet TAKE 1 AND 1/2 TABLETS BY MOUTH DAILY (Patient taking differently: Take 40 mg by mouth daily.)  . insulin glargine (LANTUS SOLOSTAR) 100 UNIT/ML Solostar Pen Inject 25 Units into the skin every morning. (Patient taking differently: Inject 18 Units into the skin daily.)  . Insulin Pen Needle (B-D ULTRAFINE III SHORT PEN) 31G X 8 MM MISC 1 each by Does not apply route as directed.  . Magnesium 100 MG CAPS Take 100 mg by mouth daily.  . Multiple Vitamin (MULTIVITAMIN  WITH MINERALS) TABS tablet Take 1 tablet by mouth daily.  . polyethylene glycol (MIRALAX / GLYCOLAX) 17 g packet Take 17 g by mouth daily.  . Semaglutide (OZEMPIC, 0.25 OR 0.5 MG/DOSE, [place]) Inject 0.5 mg into the skin every 7 (seven) days.  Marland Kitchen sertraline (ZOLOFT) 100 MG tablet Take 1 tablet (100 mg total) by mouth daily.  . valsartan (DIOVAN) 80 MG tablet TAKE 1 TABLET BY MOUTH EVERY DAY   No facility-administered encounter medications on file as of 10/06/2020.   Past Medical History:  Diagnosis Date  . Arthritis    hips shoulders  . CAD (coronary artery disease) 03/03/2019  . CHF  (congestive heart failure) ([place]) 2020  . Hypertension   . Myocardial infarction ([place])   . Sleep apnea   . 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 BIOPSY  06/27/2019   Procedure: BIOPSY;  Surgeon: Rogene Houston, MD;  [facility];  Service: Endoscopy;;  ascending colon polyp  . CARDIAC SURGERY    . COLONOSCOPY N/A 06/27/2019   Procedure: COLONOSCOPY;  Surgeon: Rogene Houston, MD;  [facility];  Service: Endoscopy;  Laterality: N/A;  1030am-office rescheduled to 06/27/19 '@9' :55am  . CORONARY ARTERY BYPASS GRAFT N/A 03/25/2019   Procedure: CORONARY ARTERY BYPASS GRAFTING (CABG) X  4 USING LEFT INTERNAL MAMMARY ARTERY AND RIGHT SAPHENOUS VEIN GRAFTS;  Surgeon: [name], MD;  Location: [facility];  Service: Open Heart Surgery;  Laterality: N/A;  . EXCISION MORTON'S NEUROMA    . EYE SURGERY Left    retina  . POLYPECTOMY  06/27/2019   Procedure: POLYPECTOMY;  Surgeon: Rogene Houston, MD;  [facility];  Service: Endoscopy;;  proximal sigmoid colon  . RIGHT/LEFT HEART CATH AND CORONARY ANGIOGRAPHY N/A 03/12/2019   Procedure: RIGHT/LEFT HEART CATH AND CORONARY ANGIOGRAPHY;  Surgeon: [name], MD;  Location: [facility];  Service: Cardiovascular;  Laterality: N/A;  . SHOULDER ARTHROSCOPY WITH ROTATOR CUFF REPAIR AND SUBACROMIAL DECOMPRESSION Right 08/26/2020   Procedure: RIGHT SHOULDER MINI OPEN ROTATOR CUFF REPAIR AND SUBACROMIAL DECOMPRESSION WITH PATCH GRAFT;  Surgeon: Susa Day, MD;  [facility];  Service: Orthopedics;  Laterality: Right;  90 MINS GENERAL WITH BLOCK  . TEE WITHOUT CARDIOVERSION N/A 03/25/2019   Procedure: TRANSESOPHAGEAL ECHOCARDIOGRAM (TEE);  Surgeon: [name], MD;  Location: [facility];  Service: Open Heart Surgery;  Laterality: N/A;    Objective: Blood pressure (!) 148/84, pulse 90, temperature 97.8 F (36.6 C),  temperature source Oral, height '5\' 10"'  (1.778 m), weight 203 lb 1.6 oz (92.1 kg). Patient is alert and in no acute distress. He is wearing a mask. Conjunctiva is pink. Sclera is nonicteric Oropharyngeal mucosa is normal. No neck masses or thyromegaly noted. Cardiac exam with regular rhythm normal S1 and S2. No murmur or gallop noted. Lungs are clear to auscultation. Abdomen is full.  No succussion splash noted over epigastric region.  On palpation abdomen is soft and nontender with organomegaly or masses. No LE edema or clubbing noted.  Labs/studies Results:  CBC Latest Ref Rng & Units 08/27/2020 08/21/2020 06/12/2020  WBC 4.0 - 10.5 K/uL 10.9(H) 7.7 8.9  Hemoglobin 13.0 - 17.0 g/dL 10.6(L) 12.1(L) 12.1(L)  Hematocrit 39.0 - 52.0 % 33.0(L) 37.2(L) 37.4(L)  Platelets 150 - 400 K/uL 235 313 280    CMP Latest Ref Rng & Units 08/27/2020 08/21/2020 06/12/2020  Glucose 70 - 99  mg/dL 175(H) 161(H) 169(H)  BUN 8 - 23 mg/dL 34(H) 27(H) 48(H)  Creatinine 0.61 - 1.24 mg/dL 2.83(H) 2.63(H) 3.37(H)  Sodium 135 - 145 mmol/L 137 141 136  Potassium 3.5 - 5.1 mmol/L 3.7 3.7 4.3  Chloride 98 - 111 mmol/L 106 107 103  CO2 22 - 32 mmol/L 21(L) 25 21(L)  Calcium 8.9 - 10.3 mg/dL 8.7(L) 9.7 9.7  Total Protein 6.5 - 8.1 g/dL - - 7.5  Total Bilirubin 0.3 - 1.2 mg/dL - - 0.9  Alkaline Phos 38 - 126 U/L - - 42  AST 15 - 41 U/L - - 11(L)  ALT 0 - 44 U/L - - 13    Hepatic Function Latest Ref Rng & Units 06/12/2020 12/31/2019 03/21/2019  Total Protein 6.5 - 8.1 g/dL 7.5 6.9 6.7  Albumin 3.5 - 5.0 g/dL 4.3 3.9 3.9  AST 15 - 41 U/L 11(L) 12(L) 19  ALT 0 - 44 U/L '13 14 20  ' Alk Phosphatase 38 - 126 U/L 42 48 56  Total Bilirubin 0.3 - 1.2 mg/dL 0.9 0.8 0.4    Serum ferritin 272 on 04/23/2019 Serum B12 was 1842 on 01/09/2020.  Assessment:  #1.  GERD.  Heartburn is not controlled with famotidine anymore.  Tachyphylaxis is quite common with histamine 2 blockers.  He does not have any alarm symptoms.  He should  however undergo esophagogastroduodenoscopy to rule out Barrett's esophagus and also find of if he has hernia or pyloric stenosis.  It is quite possible that he has developed gastroparesis secondary to diabetes mellitus.  We will need to rule out structural abnormality to upper GI tract before considering gastric emptying study.  #2.  History of colonic adenomas.  Family history of CRC in mother and late onset. He is up-to-date on colonoscopy schedule.  #3.  Anemia.  Anemia appears to be due to chronic kidney disease disease.  Serum ferritin and B12 levels have been normal.  Plan:  Pantoprazole 40 mg by mouth 30 minutes before breakfast daily. Pepcid/famotidine OTC 20 mg p.o. nightly. Esophagogastroduodenoscopy to be scheduled in 2 weeks under monitored anesthesia care. Patient will have CBC and metabolic 7 prior to procedure.

## 2020-10-06 NOTE — Patient Instructions (Signed)
Elevate head end of bed by 4 inches. Take pantoprazole 30 minutes before breakfast and famotidine 20 mg at bedtime. Esophagogastroduodenoscopy to be scheduled.

## 2020-10-06 NOTE — H&P (View-Only) (Signed)
Presenting complaint;  Chronic heartburn.  Medication not helping.  Database and subjective:  Patient is 65 year old Caucasian male who is here for scheduled visit regarding his GERD symptoms.  He was last seen for screening colonoscopy in January 2021 with removal of 2 small polyps and these were tubular adenomas.  He also had pancolonic diverticulosis and external hemorrhoids. Patient presents with daily heartburn which usually occurs in the evening.  He also has frequent nausea and infrequent vomiting.  He states he vomits at least 2-3 times in a month.  He also reports regurgitation and aspiration but likely he has not required any antibiotics.  He denies dysphagia.  He is watching his diet.  He only drinks 1 cup of coffee and drinks water rest of the day.  He is a slow eater.  He says famotidine is not working anymore.  No history of hematemesis or melena.  His appetite is good.  He has been watching his diet and has managed to lose 27 pounds in the last 2 years.  He is taking Colace and his bowels move daily without any difficulty. He eats his supper around 4 PM and goes to bed between 8:30 PM and 9 PM. He had rotator cuff repair on 08/27/2018.  He feels he is having a nice recovery. He was diagnosed with diabetes mellitus when he was in his 55s.  Most recent A1c was 6.9 g. He still has his gallbladder in situ.   Current Medications: Outpatient Encounter Medications as of 10/06/2020  Medication Sig  . acetaminophen (TYLENOL) 500 MG tablet Take 1,000 mg by mouth every 6 (six) hours as needed for moderate pain or headache.   . albuterol (VENTOLIN HFA) 108 (90 Base) MCG/ACT inhaler Inhale 2 puffs into the lungs every 6 (six) hours as needed. (Patient taking differently: Inhale 2 puffs into the lungs every 6 (six) hours as needed for wheezing or shortness of breath.)  . amLODipine (NORVASC) 10 MG tablet Take 1 tablet (10 mg total) by mouth daily.  Marland Kitchen aspirin EC 81 MG tablet Take 81 mg by mouth  daily.  . Cholecalciferol (VITAMIN D-3) 125 MCG (5000 UT) TABS Take 5,000 Units by mouth daily.  . Coenzyme Q10 (COQ10 PO) Take 1 capsule by mouth daily.  . Cyanocobalamin (B-12) 2500 MCG TABS Take 2,500 mcg by mouth daily.  . diphenhydramine-acetaminophen (TYLENOL PM) 25-500 MG TABS tablet Take 1 tablet by mouth at bedtime as needed (pain/sleep).  . docusate sodium (COLACE) 100 MG capsule Take 1 capsule (100 mg total) by mouth 2 (two) times daily as needed for mild constipation.  Marland Kitchen doxazosin (CARDURA) 2 MG tablet Take 1 tablet (2 mg total) by mouth daily. (Patient taking differently: Take 2 mg by mouth every evening.)  . Evolocumab (REPATHA SURECLICK) 160 MG/ML SOAJ Inject 140 mg into the skin every 14 (fourteen) days.  . famotidine (PEPCID) 20 MG tablet Take 1 tablet (20 mg total) by mouth 2 (two) times daily.  . furosemide (LASIX) 40 MG tablet TAKE 1 AND 1/2 TABLETS BY MOUTH DAILY (Patient taking differently: Take 40 mg by mouth daily.)  . insulin glargine (LANTUS SOLOSTAR) 100 UNIT/ML Solostar Pen Inject 25 Units into the skin every morning. (Patient taking differently: Inject 18 Units into the skin daily.)  . Insulin Pen Needle (B-D ULTRAFINE III SHORT PEN) 31G X 8 MM MISC 1 each by Does not apply route as directed.  . Magnesium 100 MG CAPS Take 100 mg by mouth daily.  . Multiple Vitamin (MULTIVITAMIN  WITH MINERALS) TABS tablet Take 1 tablet by mouth daily.  . polyethylene glycol (MIRALAX / GLYCOLAX) 17 g packet Take 17 g by mouth daily.  . Semaglutide (OZEMPIC, 0.25 OR 0.5 MG/DOSE, [place]) Inject 0.5 mg into the skin every 7 (seven) days.  Marland Kitchen sertraline (ZOLOFT) 100 MG tablet Take 1 tablet (100 mg total) by mouth daily.  . valsartan (DIOVAN) 80 MG tablet TAKE 1 TABLET BY MOUTH EVERY DAY   No facility-administered encounter medications on file as of 10/06/2020.   Past Medical History:  Diagnosis Date  . Arthritis    hips shoulders  . CAD (coronary artery disease) 03/03/2019  . CHF  (congestive heart failure) ([place]) 2020  . Hypertension   . Myocardial infarction ([place])   . Sleep apnea   . Type 2 diabetes mellitus with diabetic nephropathy ([place]) 04/23/2019   Past Surgical History:  Procedure Laterality Date  . BIOPSY  06/27/2019   Procedure: BIOPSY;  Surgeon: Rogene Houston, MD;  [facility];  Service: Endoscopy;;  ascending colon polyp  . CARDIAC SURGERY    . COLONOSCOPY N/A 06/27/2019   Procedure: COLONOSCOPY;  Surgeon: Rogene Houston, MD;  [facility];  Service: Endoscopy;  Laterality: N/A;  1030am-office rescheduled to 06/27/19 '@9' :55am  . CORONARY ARTERY BYPASS GRAFT N/A 03/25/2019   Procedure: CORONARY ARTERY BYPASS GRAFTING (CABG) X  4 USING LEFT INTERNAL MAMMARY ARTERY AND RIGHT SAPHENOUS VEIN GRAFTS;  Surgeon: [name], MD;  Location: [facility];  Service: Open Heart Surgery;  Laterality: N/A;  . EXCISION MORTON'S NEUROMA    . EYE SURGERY Left    retina  . POLYPECTOMY  06/27/2019   Procedure: POLYPECTOMY;  Surgeon: Rogene Houston, MD;  [facility];  Service: Endoscopy;;  proximal sigmoid colon  . RIGHT/LEFT HEART CATH AND CORONARY ANGIOGRAPHY N/A 03/12/2019   Procedure: RIGHT/LEFT HEART CATH AND CORONARY ANGIOGRAPHY;  Surgeon: [name], MD;  Location: [facility];  Service: Cardiovascular;  Laterality: N/A;  . SHOULDER ARTHROSCOPY WITH ROTATOR CUFF REPAIR AND SUBACROMIAL DECOMPRESSION Right 08/26/2020   Procedure: RIGHT SHOULDER MINI OPEN ROTATOR CUFF REPAIR AND SUBACROMIAL DECOMPRESSION WITH PATCH GRAFT;  Surgeon: Susa Day, MD;  [facility];  Service: Orthopedics;  Laterality: Right;  90 MINS GENERAL WITH BLOCK  . TEE WITHOUT CARDIOVERSION N/A 03/25/2019   Procedure: TRANSESOPHAGEAL ECHOCARDIOGRAM (TEE);  Surgeon: [name], MD;  Location: [facility];  Service: Open Heart Surgery;  Laterality: N/A;    Objective: Blood pressure (!) 148/84, pulse 90, temperature 97.8 F (36.6 C),  temperature source Oral, height '5\' 10"'  (1.778 m), weight 203 lb 1.6 oz (92.1 kg). Patient is alert and in no acute distress. He is wearing a mask. Conjunctiva is pink. Sclera is nonicteric Oropharyngeal mucosa is normal. No neck masses or thyromegaly noted. Cardiac exam with regular rhythm normal S1 and S2. No murmur or gallop noted. Lungs are clear to auscultation. Abdomen is full.  No succussion splash noted over epigastric region.  On palpation abdomen is soft and nontender with organomegaly or masses. No LE edema or clubbing noted.  Labs/studies Results:  CBC Latest Ref Rng & Units 08/27/2020 08/21/2020 06/12/2020  WBC 4.0 - 10.5 K/uL 10.9(H) 7.7 8.9  Hemoglobin 13.0 - 17.0 g/dL 10.6(L) 12.1(L) 12.1(L)  Hematocrit 39.0 - 52.0 % 33.0(L) 37.2(L) 37.4(L)  Platelets 150 - 400 K/uL 235 313 280    CMP Latest Ref Rng & Units 08/27/2020 08/21/2020 06/12/2020  Glucose 70 - 99  mg/dL 175(H) 161(H) 169(H)  BUN 8 - 23 mg/dL 34(H) 27(H) 48(H)  Creatinine 0.61 - 1.24 mg/dL 2.83(H) 2.63(H) 3.37(H)  Sodium 135 - 145 mmol/L 137 141 136  Potassium 3.5 - 5.1 mmol/L 3.7 3.7 4.3  Chloride 98 - 111 mmol/L 106 107 103  CO2 22 - 32 mmol/L 21(L) 25 21(L)  Calcium 8.9 - 10.3 mg/dL 8.7(L) 9.7 9.7  Total Protein 6.5 - 8.1 g/dL - - 7.5  Total Bilirubin 0.3 - 1.2 mg/dL - - 0.9  Alkaline Phos 38 - 126 U/L - - 42  AST 15 - 41 U/L - - 11(L)  ALT 0 - 44 U/L - - 13    Hepatic Function Latest Ref Rng & Units 06/12/2020 12/31/2019 03/21/2019  Total Protein 6.5 - 8.1 g/dL 7.5 6.9 6.7  Albumin 3.5 - 5.0 g/dL 4.3 3.9 3.9  AST 15 - 41 U/L 11(L) 12(L) 19  ALT 0 - 44 U/L '13 14 20  ' Alk Phosphatase 38 - 126 U/L 42 48 56  Total Bilirubin 0.3 - 1.2 mg/dL 0.9 0.8 0.4    Serum ferritin 272 on 04/23/2019 Serum B12 was 1842 on 01/09/2020.  Assessment:  #1.  GERD.  Heartburn is not controlled with famotidine anymore.  Tachyphylaxis is quite common with histamine 2 blockers.  He does not have any alarm symptoms.  He should  however undergo esophagogastroduodenoscopy to rule out Barrett's esophagus and also find of if he has hernia or pyloric stenosis.  It is quite possible that he has developed gastroparesis secondary to diabetes mellitus.  We will need to rule out structural abnormality to upper GI tract before considering gastric emptying study.  #2.  History of colonic adenomas.  Family history of CRC in mother and late onset. He is up-to-date on colonoscopy schedule.  #3.  Anemia.  Anemia appears to be due to chronic kidney disease disease.  Serum ferritin and B12 levels have been normal.  Plan:  Pantoprazole 40 mg by mouth 30 minutes before breakfast daily. Pepcid/famotidine OTC 20 mg p.o. nightly. Esophagogastroduodenoscopy to be scheduled in 2 weeks under monitored anesthesia care. Patient will have CBC and metabolic 7 prior to procedure.

## 2020-10-07 ENCOUNTER — Encounter (INDEPENDENT_AMBULATORY_CARE_PROVIDER_SITE_OTHER): Payer: Self-pay | Admitting: *Deleted

## 2020-10-08 ENCOUNTER — Other Ambulatory Visit (INDEPENDENT_AMBULATORY_CARE_PROVIDER_SITE_OTHER): Payer: Self-pay

## 2020-10-08 DIAGNOSIS — K219 Gastro-esophageal reflux disease without esophagitis: Secondary | ICD-10-CM

## 2020-10-08 DIAGNOSIS — R531 Weakness: Secondary | ICD-10-CM | POA: Diagnosis not present

## 2020-10-08 DIAGNOSIS — M25512 Pain in left shoulder: Secondary | ICD-10-CM | POA: Diagnosis not present

## 2020-10-08 DIAGNOSIS — Z4789 Encounter for other orthopedic aftercare: Secondary | ICD-10-CM | POA: Diagnosis not present

## 2020-10-08 DIAGNOSIS — M25511 Pain in right shoulder: Secondary | ICD-10-CM | POA: Diagnosis not present

## 2020-10-12 DIAGNOSIS — E119 Type 2 diabetes mellitus without complications: Secondary | ICD-10-CM | POA: Diagnosis not present

## 2020-10-14 ENCOUNTER — Other Ambulatory Visit: Payer: Self-pay

## 2020-10-14 ENCOUNTER — Encounter: Payer: Self-pay | Admitting: Cardiovascular Disease

## 2020-10-14 ENCOUNTER — Ambulatory Visit (INDEPENDENT_AMBULATORY_CARE_PROVIDER_SITE_OTHER): Payer: Medicare Other | Admitting: Cardiovascular Disease

## 2020-10-14 VITALS — BP 128/78 | HR 85 | Ht 70.0 in | Wt 208.2 lb

## 2020-10-14 DIAGNOSIS — Z951 Presence of aortocoronary bypass graft: Secondary | ICD-10-CM

## 2020-10-14 DIAGNOSIS — R5383 Other fatigue: Secondary | ICD-10-CM | POA: Diagnosis not present

## 2020-10-14 DIAGNOSIS — N183 Chronic kidney disease, stage 3 unspecified: Secondary | ICD-10-CM | POA: Diagnosis not present

## 2020-10-14 DIAGNOSIS — T466X5A Adverse effect of antihyperlipidemic and antiarteriosclerotic drugs, initial encounter: Secondary | ICD-10-CM

## 2020-10-14 DIAGNOSIS — E782 Mixed hyperlipidemia: Secondary | ICD-10-CM | POA: Diagnosis not present

## 2020-10-14 DIAGNOSIS — I10 Essential (primary) hypertension: Secondary | ICD-10-CM | POA: Diagnosis not present

## 2020-10-14 DIAGNOSIS — Z5181 Encounter for therapeutic drug level monitoring: Secondary | ICD-10-CM | POA: Diagnosis not present

## 2020-10-14 DIAGNOSIS — M791 Myalgia, unspecified site: Secondary | ICD-10-CM

## 2020-10-14 HISTORY — DX: Other fatigue: R53.83

## 2020-10-14 NOTE — Assessment & Plan Note (Signed)
He has not tolerated statins in the past.  He was supposed to start on Repatha but never received it.  We will check on this.  Once he starts it repeat lipids and a CMP in 3 months.

## 2020-10-14 NOTE — Assessment & Plan Note (Signed)
Stable without angina.  Continue aspirin and start Repatha as above.  He is not on a beta-blocker due to intolerance.

## 2020-10-14 NOTE — Assessment & Plan Note (Signed)
Unclear what is causing this.  He reports that his mood is stable and he does not feel depressed.  He is mildly anemic.  EGD seen.  He is compliant with his CPAP.  We will check a TSH when he gets labs next.

## 2020-10-14 NOTE — Progress Notes (Addendum)
Advanced Hypertension Clinic Follow up    Date:  10/14/2020   ID:  [name], DOB [birth year], MRN [id number]  PCP:  [name] Drown, MD  Cardiologist:  [name] Latch, MD  Nephrologist:  Referring MD: [name] Drown, MD   CC: Hypertension  History of Present Illness:    [name] is a 65 y.o. male with a hx of CAD status post CABG, chronic systolic and diastolic heart failure, hypertension, hyperlipidemia, diabetes, peripheral arterial disease, and CKD 3 here to establish care in the hypertension clinic.  He had a four-vessel CABG on 03/2019.  He has struggled with poorly controlled blood pressures and his blood pressure has been as high as the 200s over 100s.  He has not tolerated amlodipine, lisinopril, HCTZ, or beta-blockers due to them making him feel fatigued.  He saw [name] on 7/23 and his blood pressure was 172/88.  He was referred to advanced hypertension clinic for further evaluation and management.  He notes that his BP is labile.   At his intial appointment he reported shortness of breath and lightheadedness.  Dr. Moshe Cipro started him on lasix which has helped his edema.  This was increased to 60mg  daily after a hospitalization for acute on chronic diastolic heart failure.  He has been working on his diet.  He limits carbohydrates and has a high protein diet.  He limits red meat.  He drinks a lot of water and limits soda.  He quit smoking 25-30 years ago.  He smoked 2ppd x10-15 years.   At his initial appointment he was started on doxazosin.  He followed up with our pharmacist and started on valsartan. At his last appointment he had orthostatic drops in his blood pressures to the 90s, so doxazosin was reduced. He followed up with our pharmacist 08/2020 and his blood pressure was in the 140s but no changes were made due to his issues with low blood pressure in the past.   Today, he is accompanied by his wife. He is feeling ok overall, but feels very fatigued and  states he could fall asleep right now. He has been feeling this way for the previous two months. His at home blood pressure remains well controlled overall. However, they have noticed a drop in blood pressure when he forgets to take his medication. He has sleep apnea and normally uses a CPAP. For a while he discontinued use after developing ocular bleeding.   He denies any chest pains, shortness of breath, or palpitations. Mostly he drinks water. He continues to monitor his diet and occasionally eats sweets. He does not participate in much formal exercise primarily due to a lack of energy. He feels more disgusted rather than depressed about not being able to exercise like he used to. He reports never receiving his Repatha medication. When he was on a beta blocker he did not tolerate that well. He is scheduled for an endoscopy next week, and is currently in physical therapy for his shoulder.   Previous antihypertensives: Amlodipine Lisinopril HCTZ Beta-blockers Hydralazine   Past Medical History:  Diagnosis Date  . Arthritis    hips shoulders  . CAD (coronary artery disease) 03/03/2019  . CHF (congestive heart failure) ([place]) 2020  . Chronic combined systolic and diastolic heart failure ([place]) 04/26/2018   Admitted 11/14-11/20/19-diuresed 10L  . Fatigue 10/14/2020  . Hypertension   . Myocardial infarction ([place])   . Sleep apnea   . Type 2 diabetes mellitus with diabetic nephropathy ([place]) 04/23/2019  Past Surgical History:  Procedure Laterality Date  . BIOPSY  06/27/2019   Procedure: BIOPSY;  Surgeon: [name] Houston, MD;  [facility];  Service: Endoscopy;;  ascending colon polyp  . CARDIAC SURGERY    . COLONOSCOPY N/A 06/27/2019   Procedure: COLONOSCOPY;  Surgeon: [name] Houston, MD;  [facility];  Service: Endoscopy;  Laterality: N/A;  1030am-office rescheduled to 06/27/19 @9 :55am  . CORONARY ARTERY BYPASS GRAFT N/A 03/25/2019   Procedure: CORONARY ARTERY  BYPASS GRAFTING (CABG) X  4 USING LEFT INTERNAL MAMMARY ARTERY AND RIGHT SAPHENOUS VEIN GRAFTS;  Surgeon: [name] Matte, MD;  Location: [facility];  Service: Open Heart Surgery;  Laterality: N/A;  . EXCISION MORTON'S NEUROMA    . EYE SURGERY Left    retina  . POLYPECTOMY  06/27/2019   Procedure: POLYPECTOMY;  Surgeon: [name] Houston, MD;  [facility];  Service: Endoscopy;;  proximal sigmoid colon  . RIGHT/LEFT HEART CATH AND CORONARY ANGIOGRAPHY N/A 03/12/2019   Procedure: RIGHT/LEFT HEART CATH AND CORONARY ANGIOGRAPHY;  Surgeon: [name] Booze, MD;  Location: [facility];  Service: Cardiovascular;  Laterality: N/A;  . SHOULDER ARTHROSCOPY WITH ROTATOR CUFF REPAIR AND SUBACROMIAL DECOMPRESSION Right 08/26/2020   Procedure: RIGHT SHOULDER MINI OPEN ROTATOR CUFF REPAIR AND SUBACROMIAL DECOMPRESSION WITH PATCH GRAFT;  Surgeon: [name] Day, MD;  Location: [facility];  Service: Orthopedics;  Laterality: Right;  90 MINS GENERAL WITH BLOCK  . TEE WITHOUT CARDIOVERSION N/A 03/25/2019   Procedure: TRANSESOPHAGEAL ECHOCARDIOGRAM (TEE);  Surgeon: [name] Matte, MD;  Location: [facility];  Service: Open Heart Surgery;  Laterality: N/A;    Current Medications: Current Meds  Medication Sig  . furosemide (LASIX) 40 MG tablet Take 40 mg by mouth daily.     Allergies:   Bee venom, Atorvastatin, and Rosuvastatin   Social History   Socioeconomic History  . Marital status: Single    Spouse name: Not on file  . Number of children: Not on file  . Years of education: Not on file  . Highest education level: Not on file  Occupational History  . Not on file  Tobacco Use  . Smoking status: Former Smoker    Quit date: 07/06/1988    Years since quitting: 32.2  . Smokeless tobacco: Never Used  Vaping Use  . Vaping Use: Never used  Substance and Sexual Activity  . Alcohol use: Not Currently    Comment: socially  . Drug use: No  . Sexual activity: Not on file  Other Topics  Concern  . Not on file  Social History Narrative  . Not on file   Social Determinants of Health   Financial Resource Strain: Not on file  Food Insecurity: Not on file  Transportation Needs: Not on file  Physical Activity: Not on file  Stress: Not on file  Social Connections: Not on file     Family History: The patient's family history includes Colon cancer in his mother; Diabetes in his father; Heart Problems in his father; Valvular heart disease in his father.  ROS:   Please see the history of present illness.    (+) Fatigue All other systems reviewed and are negative.  EKGs/Labs/Other Studies Reviewed:    EKG:     01/01/20: Sinus rhythm.  Rate 87 bpm.  Nonspecific T wave abnormalities. 06/17/2020: EKG was not ordered. 10/14/2020: EKG is not ordered today.   Echo 04/26/2018: - Left ventricle: The cavity size was normal. There was minimal  concentric hypertrophy. Systolic function was mildly reduced. The  estimated ejection fraction was in the range of 45% to 50%.  Diffuse hypokinesis. Findings consistent with left ventricular  diastolic dysfunction, grade indeterminate. Doppler parameters  are consistent with high ventricular filling pressure.  - Mitral valve: Mildly calcified annulus. There was mild  regurgitation.  - Left atrium: The atrium was severely dilated.  - Right atrium: The atrium was mildly dilated.  - Inferior vena cava: The vessel was dilated. The respirophasic  diameter changes were blunted (< 50%), consistent with elevated  central venous pressure. Estimated CVP 15 mmHg.   Right/Left Heart Cath and Coronary Angiography 03/12/2019:  Mid LAD lesion is 80% stenosed.  Ost LAD to Mid LAD lesion is 50% stenosed.  1st Diag lesion is 80% stenosed.  2nd Diag lesion is 75% stenosed.  Mid LM to Dist LM lesion is 25% stenosed.  Prox Cx lesion is 90% stenosed.  1st Mrg lesion is 80% stenosed.  2nd Mrg lesion is 80% stenosed.  Dist RCA  lesion is 90% stenosed.  Mid RCA lesion is 75% stenosed.  RPAV lesion is 80% stenosed.  LV end diastolic pressure is moderately elevated.  There is no aortic valve stenosis.  Hemodynamic findings consistent with mild pulmonary hypertension.   Severe multivessel CAD.  Plan for outpatient evaluation for CABG>  His symptoms are all with exertion.  He feels fine at rest.  I instructed him to avoid any strenuous activity, or anything that would bring on shortness of breath.  Continue aspirin and Imdur.   Continue Crestor.   Echo 03/15/2019 1. Left ventricular ejection fraction, by visual estimation, is 45 to  50%. The left ventricle has mildly decreased function. There is mildly  increased left ventricular hypertrophy.  2. Left ventricular diastolic Doppler parameters are consistent with  impaired relaxation pattern of LV diastolic filling.  3. Global right ventricle has normal systolic function.The right  ventricular size is normal. No increase in right ventricular wall  thickness.  4. Left atrial size was severely dilated.  5. Right atrial size was normal.  6. Mild mitral annular calcification.  7. Moderate calcification of the mitral valve leaflet(s).  8. Moderate thickening of the mitral valve leaflet(s).  9. The mitral valve is abnormal. Trace mitral valve regurgitation. No  evidence of mitral stenosis.  10. The tricuspid valve is normal in structure. Tricuspid valve  regurgitation is trivial.  11. The aortic valve is tricuspid Aortic valve regurgitation was not  visualized by color flow Doppler. Mild aortic valve sclerosis without  stenosis.  12. The pulmonic valve was not well visualized. Pulmonic valve  regurgitation is mild by color flow Doppler.  13. Mildly elevated pulmonary artery systolic pressure.   VAS US DOPPLER PRE CABG 03/21/2019:  Right Carotid: Velocities in the right ICA are consistent with a 1-39%  stenosis.   Left Carotid: There is no evidence of  stenosis in the left ICA.  Vertebrals: Bilateral vertebral arteries demonstrate antegrade flow.   Bilateral Extremity: Doppler waveforms remain within normal limits with  compression bilaterally for the radial arteries. Doppler waveforms remain  within normal limits with compression bilaterally for the ulnar arteries.   ECHO INTRAOPERATIVE TEE 03/25/2019: POST-OP IMPRESSIONS  - Aorta: The aorta appears unchanged from pre-bypass.  - Left Atrial Appendage: The left atrial appendage appears unchanged from  pre-bypass.  - Aortic Valve: The aortic valve appears unchanged from pre-bypass.  - Mitral Valve: The mitral valve appears unchanged from pre-bypass.  - Tricuspid Valve:  The tricuspid valve appears unchanged from pre-bypass.  - Pericardium: The pericardium appears unchanged from pre-bypass.  VAS [place] LOWER EXTREMITY VENOUS 03/30/2019: Right: There is no evidence of deep vein thrombosis in the lower  extremity.  Left: No evidence of common femoral vein obstruction.  Echo 05/29/2019 1. Left ventricular ejection fraction, by visual estimation, is 50 to  55%. The left ventricle has low normal function. There is mildly increased  left ventricular hypertrophy.  2. Basal anterolateral segment, basal inferior segment, and basal  inferolateral segment are abnormal.  3. The left ventricle demonstrates regional wall motion abnormalities.  4. Global right ventricle has mildly reduced systolic function.The right  ventricular size is normal. No increase in right ventricular wall  thickness.  5. Left atrial size was moderately dilated.  6. Right atrial size was normal.  7. Moderate mitral annular calcification.  8. The mitral valve is grossly normal. Trivial mitral valve  regurgitation.  9. The tricuspid valve is grossly normal. Tricuspid valve regurgitation  is trivial.  10. The aortic valve is tricuspid. Aortic valve regurgitation is not  visualized.  11. The pulmonic valve was  grossly normal. Pulmonic valve regurgitation is  trivial.  12. Normal pulmonary artery systolic pressure.  13. The tricuspid regurgitant velocity is 2.30 m/s, and with an assumed  right atrial pressure of 3 mmHg, the estimated right ventricular systolic  pressure is normal at 24.2 mmHg.  14. The inferior vena cava is normal in size with greater than 50%  respiratory variability, suggesting right atrial pressure of 3 mmHg.    Echo 09/2019: 1. Left ventricular ejection fraction, by estimation, is 55 to 60%. The  left ventricle has normal function. The left ventricle has no regional  wall motion abnormalities. There is moderate left ventricular hypertrophy.  Left ventricular diastolic  parameters are consistent with Grade I diastolic dysfunction (impaired  relaxation).  2. Right ventricular systolic function is normal. The right ventricular  size is normal.  3. Left atrial size was severely dilated.  4. The mitral valve is normal in structure. Trivial mitral valve  regurgitation. No evidence of mitral stenosis.  5. The aortic valve was not well visualized. Aortic valve regurgitation  is not visualized. No aortic stenosis is present.  6. The inferior vena cava is normal in size with greater than 50%  respiratory variability, suggesting right atrial pressure of 3 mmHg.    Recent Labs: 12/31/2019: B Natriuretic Peptide 1,409.0 06/12/2020: ALT 13 08/27/2020: BUN 34; Creatinine, Ser 2.83; Hemoglobin 10.6; Platelets 235; Potassium 3.7; Sodium 137   Recent Lipid Panel    Component Value Date/Time   CHOL 265 (H) 01/09/2020 1335   TRIG 234 (H) 01/09/2020 1335   HDL 44 01/09/2020 1335   CHOLHDL 6.0 (H) 01/09/2020 1335   CHOLHDL 3.9 04/26/2018 1011   VLDL 20 04/26/2018 1011   LDLCALC 177 (H) 01/09/2020 1335    Physical Exam:    VS:  BP 128/78   Pulse 85   Ht 5\' 10"  (1.778 m)   Wt 208 lb 3.2 oz (94.4 kg)   SpO2 97%   BMI 29.87 kg/m  , BMI Body mass index is 29.87  kg/m. GENERAL:  Well appearing HEENT: Pupils equal round and reactive, fundi not visualized, oral mucosa unremarkable NECK:  No jugular venous distention, waveform within normal limits, carotid upstroke brisk and symmetric, no bruits, no thyromegaly LUNGS:  Clear to auscultation bilaterally HEART:  RRR.  PMI not displaced or sustained,S1 and S2 within normal limits, no S3,  no S4, no clicks, no rubs, no murmurs ABD:  Flat, positive bowel sounds normal in frequency in pitch, no bruits, no rebound, no guarding, no midline pulsatile mass, no hepatomegaly, no splenomegaly EXT:  2 plus pulses throughout, no edema, no cyanosis no clubbing SKIN:  No rashes no nodules NEURO:  Cranial nerves II through XII grossly intact, motor grossly intact throughout PSYCH:  Cognitively intact, oriented to person place and time   ASSESSMENT/PLAN:   Essential hypertension, benign BP is much better controlled on amlodipine, doxazosin and valsartan.  Continue current regimen.  He is complaining of some fatigue.  His current medications are not particularly sedating.  He does have some anemia.  He is scheduled to undergo an EGD soon.  He was encouraged to start back exercising once his energy level is improved.  Continue using CPAP.  CRI (chronic renal insufficiency), stage 3 (moderate) (HCC) Creatinine stable.  Continue current regimen.  Mixed hyperlipidemia He has not tolerated statins in the past.  He was supposed to start on Repatha but never received it.  We will check on this.  Once he starts it repeat lipids and a CMP in 3 months.  S/P CABG (coronary artery bypass graft) Stable without angina.  Continue aspirin and start Repatha as above.  He is not on a beta-blocker due to intolerance.  Fatigue Unclear what is causing this.  He reports that his mood is stable and he does not feel depressed.  He is mildly anemic.  EGD seen.  He is compliant with his CPAP.  We will check a TSH when he gets labs next.   1.  Mixed hyperlipidemia   2. Essential hypertension, benign   3. Therapeutic drug monitoring   4. CRI (chronic renal insufficiency), stage 3 (moderate) (HCC)   5. S/P CABG (coronary artery bypass graft)   6. Myalgia due to statin   7. Fatigue, unspecified type      Disposition:    FU with MD/PharmD in 6 months.   Medication Adjustments/Labs and Tests Ordered: Current medicines are reviewed at length with the patient today.  Concerns regarding medicines are outlined above.  Orders Placed This Encounter  Procedures  . Lipid panel  . Comprehensive metabolic panel  . TSH   No orders of the defined types were placed in this encounter.   I,Mathew Stumpf,acting as a Education administrator for [name] Latch, MD.,have documented all relevant documentation on the behalf of [name] Latch, MD,as directed by  [name] Latch, MD while in the presence of [name] Latch, MD.  I, [place] Linsey, MD have reviewed all documentation for this visit.  The documentation of the exam, diagnosis, procedures, and orders on 10/14/2020 are all accurate and complete.   Signed, [name] Latch, MD  10/14/2020 5:24 PM    [place]

## 2020-10-14 NOTE — Assessment & Plan Note (Addendum)
BP is much better controlled on amlodipine, doxazosin and valsartan.  Continue current regimen.  He is complaining of some fatigue.  His current medications are not particularly sedating.  He does have some anemia.  He is scheduled to undergo an EGD soon.  He was encouraged to start back exercising once his energy level is improved.  Continue using CPAP.

## 2020-10-14 NOTE — Assessment & Plan Note (Signed)
Creatinine stable.  Continue current regimen.

## 2020-10-14 NOTE — Patient Instructions (Signed)
Medication Instructions:    Labwork: AFTER YOU HAVE BEEN ON REPATHA FOR 3 MONTHS COME FOR FASTING LP/CMET/TSH   Testing/Procedures: NONE  Follow-Up: Your physician wants you to follow-up in: [facility] [place] will receive a reminder letter in the mail two months in advance. If you don't receive a letter, please call our office to schedule the follow-up appointment.

## 2020-10-16 NOTE — Patient Instructions (Signed)
[name]  10/16/2020     @PREFPERIOPPHARMACY @   Your procedure is scheduled on  10/21/2020.   Report to [facility] at  [email or web address].   Call this number if you have problems the morning of surgery:  [phone]   Remember:  Follow the diet instructions given to you by the office.                         Take these medicines the morning of surgery with A SIP OF WATER  Amlodipine, pepcid, protonix, zoloft, diovan.  Use your inhaler before your come and bring your rescue inhaler with you.  DO NOT take any medications for diabetes the morning of your procedure.  If your glucose is 70 or below the morning of your procedure, drink 1/2 cup of clear liquids containing sugar and recheck your glucose in 15 minutes. If your glucose is still 70 or below, call [phone].  If your glucose is 300 or above the morning of your procedure, call [phone] for instructions.      Please brush your teeth.  Do not wear jewelry, make-up or nail polish.  Do not wear lotions, powders, or perfumes, or deodorant.  Do not shave 48 hours prior to surgery.  Men may shave face and neck.  Do not bring valuables to the hospital.  [facility] is not responsible for any belongings or valuables.  Contacts, dentures or bridgework may not be worn into surgery.  Leave your suitcase in the car.  After surgery it may be brought to your room.  For patients admitted to the hospital, discharge time will be determined by your treatment team.  Patients discharged the day of surgery will not be allowed to drive home and must have someone with them for 24 hours.    Special instructions:  DO NOT smoke tobacco or vape for 24 hours before your procedure.  Please read over the following fact sheets that you were given. Anesthesia Post-op Instructions and Care and Recovery After Surgery       Upper Endoscopy, Adult, Care After This sheet gives you information about how to care for yourself after  your procedure. Your health care provider may also give you more specific instructions. If you have problems or questions, contact your health care provider. What can I expect after the procedure? After the procedure, it is common to have:  A sore throat.  Mild stomach pain or discomfort.  Bloating.  Nausea. Follow these instructions at home:  Follow instructions from your health care provider about what to eat or drink after your procedure.  Return to your normal activities as told by your health care provider. Ask your health care provider what activities are safe for you.  Take over-the-counter and prescription medicines only as told by your health care provider.  If you were given a sedative during the procedure, it can affect you for several hours. Do not drive or operate machinery until your health care provider says that it is safe.  Keep all follow-up visits as told by your health care provider. This is important.   Contact a health care provider if you have:  A sore throat that lasts longer than one day.  Trouble swallowing. Get help right away if:  You vomit blood or your vomit looks like coffee grounds.  You have: ? A fever. ? Bloody, black, or tarry stools. ? A severe sore throat or  you cannot swallow. ? Difficulty breathing. ? Severe pain in your chest or abdomen. Summary  After the procedure, it is common to have a sore throat, mild stomach discomfort, bloating, and nausea.  If you were given a sedative during the procedure, it can affect you for several hours. Do not drive or operate machinery until your health care provider says that it is safe.  Follow instructions from your health care provider about what to eat or drink after your procedure.  Return to your normal activities as told by your health care provider. This information is not intended to replace advice given to you by your health care provider. Make sure you discuss any questions you have  with your health care provider. Document Revised: 05/28/2019 Document Reviewed: 10/30/2017 Elsevier Patient Education  2021 [place] After This sheet gives you information about how to care for yourself after your procedure. Your health care provider may also give you more specific instructions. If you have problems or questions, contact your health care provider. What can I expect after the procedure? After the procedure, it is common to have:  Tiredness.  Forgetfulness about what happened after the procedure.  Impaired judgment for important decisions.  Nausea or vomiting.  Some difficulty with balance. Follow these instructions at home: For the time period you were told by your health care provider:  Rest as needed.  Do not participate in activities where you could fall or become injured.  Do not drive or use machinery.  Do not drink alcohol.  Do not take sleeping pills or medicines that cause drowsiness.  Do not make important decisions or sign legal documents.  Do not take care of children on your own.      Eating and drinking  Follow the diet that is recommended by your health care provider.  Drink enough fluid to keep your urine pale yellow.  If you vomit: ? Drink water, juice, or soup when you can drink without vomiting. ? Make sure you have little or no nausea before eating solid foods. General instructions  Have a responsible adult stay with you for the time you are told. It is important to have someone help care for you until you are awake and alert.  Take over-the-counter and prescription medicines only as told by your health care provider.  If you have sleep apnea, surgery and certain medicines can increase your risk for breathing problems. Follow instructions from your health care provider about wearing your sleep device: ? Anytime you are sleeping, including during daytime naps. ? While taking prescription pain  medicines, sleeping medicines, or medicines that make you drowsy.  Avoid smoking.  Keep all follow-up visits as told by your health care provider. This is important. Contact a health care provider if:  You keep feeling nauseous or you keep vomiting.  You feel light-headed.  You are still sleepy or having trouble with balance after 24 hours.  You develop a rash.  You have a fever.  You have redness or swelling around the IV site. Get help right away if:  You have trouble breathing.  You have new-onset confusion at home. Summary  For several hours after your procedure, you may feel tired. You may also be forgetful and have poor judgment.  Have a responsible adult stay with you for the time you are told. It is important to have someone help care for you until you are awake and alert.  Rest as told. Do not drive  or operate machinery. Do not drink alcohol or take sleeping pills.  Get help right away if you have trouble breathing, or if you suddenly become confused. This information is not intended to replace advice given to you by your health care provider. Make sure you discuss any questions you have with your health care provider. Document Revised: 02/13/2020 Document Reviewed: 05/02/2019 Elsevier Patient Education  2021 [employer].

## 2020-10-19 ENCOUNTER — Encounter (HOSPITAL_COMMUNITY)
Admission: RE | Admit: 2020-10-19 | Discharge: 2020-10-19 | Disposition: A | Discharge status: Home / Self Care | Payer: Medicare Other | Source: Ambulatory Visit | Attending: Internal Medicine | Admitting: Internal Medicine

## 2020-10-19 ENCOUNTER — Encounter (HOSPITAL_COMMUNITY): Payer: Self-pay

## 2020-10-19 ENCOUNTER — Other Ambulatory Visit: Payer: Self-pay

## 2020-10-19 ENCOUNTER — Other Ambulatory Visit (HOSPITAL_COMMUNITY)
Admission: RE | Admit: 2020-10-19 | Discharge: 2020-10-19 | Disposition: A | Discharge status: Home / Self Care | Payer: Medicare Other | Source: Ambulatory Visit | Attending: Internal Medicine | Admitting: Internal Medicine

## 2020-10-19 DIAGNOSIS — Z01812 Encounter for preprocedural laboratory examination: Secondary | ICD-10-CM | POA: Insufficient documentation

## 2020-10-19 DIAGNOSIS — Z20822 Contact with and (suspected) exposure to covid-19: Secondary | ICD-10-CM | POA: Diagnosis not present

## 2020-10-19 DIAGNOSIS — K219 Gastro-esophageal reflux disease without esophagitis: Secondary | ICD-10-CM

## 2020-10-19 LAB — CBC WITH DIFFERENTIAL/PLATELET
Abs Immature Granulocytes: 0.02 10*3/uL (ref 0.00–0.07)
Basophils Absolute: 0.1 10*3/uL (ref 0.0–0.1)
Basophils Relative: 1 %
Eosinophils Absolute: 0.2 10*3/uL (ref 0.0–0.5)
Eosinophils Relative: 2 %
HCT: 36 % — ABNORMAL LOW (ref 39.0–52.0)
Hemoglobin: 11.6 g/dL — ABNORMAL LOW (ref 13.0–17.0)
Immature Granulocytes: 0 %
Lymphocytes Relative: 19 %
Lymphs Abs: 1.3 10*3/uL (ref 0.7–4.0)
MCH: 27.9 pg (ref 26.0–34.0)
MCHC: 32.2 g/dL (ref 30.0–36.0)
MCV: 86.5 fL (ref 80.0–100.0)
Monocytes Absolute: 0.5 10*3/uL (ref 0.1–1.0)
Monocytes Relative: 7 %
Neutro Abs: 4.9 10*3/uL (ref 1.7–7.7)
Neutrophils Relative %: 71 %
Platelets: 300 10*3/uL (ref 150–400)
RBC: 4.16 MIL/uL — ABNORMAL LOW (ref 4.22–5.81)
RDW: 14.6 % (ref 11.5–15.5)
WBC: 7 10*3/uL (ref 4.0–10.5)
nRBC: 0 % (ref 0.0–0.2)

## 2020-10-19 LAB — COMPREHENSIVE METABOLIC PANEL
ALT: 9 U/L (ref 0–44)
AST: 9 U/L — ABNORMAL LOW (ref 15–41)
Albumin: 3.8 g/dL (ref 3.5–5.0)
Alkaline Phosphatase: 59 U/L (ref 38–126)
Anion gap: 8 (ref 5–15)
BUN: 37 mg/dL — ABNORMAL HIGH (ref 8–23)
CO2: 22 mmol/L (ref 22–32)
Calcium: 9 mg/dL (ref 8.9–10.3)
Chloride: 108 mmol/L (ref 98–111)
Creatinine, Ser: 3.01 mg/dL — ABNORMAL HIGH (ref 0.61–1.24)
GFR, Estimated: 22 mL/min — ABNORMAL LOW (ref >60)
Glucose, Bld: 242 mg/dL — ABNORMAL HIGH (ref 70–99)
Potassium: 3.9 mmol/L (ref 3.5–5.1)
Sodium: 138 mmol/L (ref 135–145)
Total Bilirubin: 0.7 mg/dL (ref 0.3–1.2)
Total Protein: 7.1 g/dL (ref 6.5–8.1)

## 2020-10-19 LAB — SARS CORONAVIRUS 2 (TAT 6-24 HRS): SARS Coronavirus 2: NEGATIVE

## 2020-10-21 ENCOUNTER — Ambulatory Visit (HOSPITAL_COMMUNITY)
Admission: RE | Admit: 2020-10-21 | Discharge: 2020-10-21 | Disposition: A | Discharge status: Home / Self Care | Payer: Medicare Other | Attending: Internal Medicine | Admitting: Internal Medicine

## 2020-10-21 ENCOUNTER — Encounter (HOSPITAL_COMMUNITY): Payer: Self-pay | Admitting: Internal Medicine

## 2020-10-21 ENCOUNTER — Other Ambulatory Visit: Payer: Self-pay

## 2020-10-21 ENCOUNTER — Ambulatory Visit (HOSPITAL_COMMUNITY): Payer: Medicare Other | Admitting: Anesthesiology

## 2020-10-21 ENCOUNTER — Encounter (HOSPITAL_COMMUNITY)
Admission: RE | Disposition: A | Discharge status: Home / Self Care | Payer: Self-pay | Source: Home / Self Care | Attending: Internal Medicine

## 2020-10-21 DIAGNOSIS — Z951 Presence of aortocoronary bypass graft: Secondary | ICD-10-CM | POA: Diagnosis not present

## 2020-10-21 DIAGNOSIS — I509 Heart failure, unspecified: Secondary | ICD-10-CM | POA: Diagnosis not present

## 2020-10-21 DIAGNOSIS — E1122 Type 2 diabetes mellitus with diabetic chronic kidney disease: Secondary | ICD-10-CM | POA: Insufficient documentation

## 2020-10-21 DIAGNOSIS — Z86010 Personal history of colonic polyps: Secondary | ICD-10-CM | POA: Insufficient documentation

## 2020-10-21 DIAGNOSIS — K449 Diaphragmatic hernia without obstruction or gangrene: Secondary | ICD-10-CM | POA: Diagnosis not present

## 2020-10-21 DIAGNOSIS — Z7982 Long term (current) use of aspirin: Secondary | ICD-10-CM | POA: Insufficient documentation

## 2020-10-21 DIAGNOSIS — Z87891 Personal history of nicotine dependence: Secondary | ICD-10-CM | POA: Insufficient documentation

## 2020-10-21 DIAGNOSIS — Z79899 Other long term (current) drug therapy: Secondary | ICD-10-CM | POA: Diagnosis not present

## 2020-10-21 DIAGNOSIS — D631 Anemia in chronic kidney disease: Secondary | ICD-10-CM | POA: Diagnosis not present

## 2020-10-21 DIAGNOSIS — K317 Polyp of stomach and duodenum: Secondary | ICD-10-CM | POA: Diagnosis not present

## 2020-10-21 DIAGNOSIS — I2510 Atherosclerotic heart disease of native coronary artery without angina pectoris: Secondary | ICD-10-CM | POA: Diagnosis not present

## 2020-10-21 DIAGNOSIS — K219 Gastro-esophageal reflux disease without esophagitis: Secondary | ICD-10-CM | POA: Diagnosis not present

## 2020-10-21 DIAGNOSIS — I130 Hypertensive heart and chronic kidney disease with heart failure and stage 1 through stage 4 chronic kidney disease, or unspecified chronic kidney disease: Secondary | ICD-10-CM | POA: Diagnosis not present

## 2020-10-21 DIAGNOSIS — N189 Chronic kidney disease, unspecified: Secondary | ICD-10-CM | POA: Insufficient documentation

## 2020-10-21 DIAGNOSIS — K2270 Barrett's esophagus without dysplasia: Secondary | ICD-10-CM | POA: Insufficient documentation

## 2020-10-21 DIAGNOSIS — K22710 Barrett's esophagus with low grade dysplasia: Secondary | ICD-10-CM | POA: Diagnosis not present

## 2020-10-21 DIAGNOSIS — Z794 Long term (current) use of insulin: Secondary | ICD-10-CM | POA: Diagnosis not present

## 2020-10-21 DIAGNOSIS — K2289 Other specified disease of esophagus: Secondary | ICD-10-CM | POA: Diagnosis not present

## 2020-10-21 HISTORY — PX: BIOPSY: SHX5522

## 2020-10-21 HISTORY — PX: ESOPHAGOGASTRODUODENOSCOPY (EGD) WITH PROPOFOL: SHX5813

## 2020-10-21 LAB — GLUCOSE, CAPILLARY: Glucose-Capillary: 133 mg/dL — ABNORMAL HIGH (ref 70–99)

## 2020-10-21 SURGERY — ESOPHAGOGASTRODUODENOSCOPY (EGD) WITH PROPOFOL
Anesthesia: General

## 2020-10-21 MED ORDER — PROPOFOL 10 MG/ML IV BOLUS
INTRAVENOUS | Status: DC | PRN
  Administered 2020-10-21: 100 mg via INTRAVENOUS
  Administered 2020-10-21: 40 mg via INTRAVENOUS

## 2020-10-21 MED ORDER — LACTATED RINGERS IV SOLN: INTRAVENOUS | Status: DC

## 2020-10-21 MED ORDER — PROPOFOL 500 MG/50ML IV EMUL
INTRAVENOUS | Status: DC | PRN
  Administered 2020-10-21: 150 ug/kg/min via INTRAVENOUS

## 2020-10-21 MED ORDER — LIDOCAINE HCL (CARDIAC) PF 100 MG/5ML IV SOSY
PREFILLED_SYRINGE | INTRAVENOUS | Status: DC | PRN
  Administered 2020-10-21: 50 mg via INTRAVENOUS

## 2020-10-21 NOTE — Anesthesia Postprocedure Evaluation (Signed)
Anesthesia Post Note  Patient: RODNEY YERA  Procedure(s) Performed: ESOPHAGOGASTRODUODENOSCOPY (EGD) WITH PROPOFOL (N/A ) BIOPSY  Patient location during evaluation: Phase II Anesthesia Type: General Level of consciousness: awake and alert and oriented Pain management: pain level controlled Vital Signs Assessment: post-procedure vital signs reviewed and stable Respiratory status: spontaneous breathing and respiratory function stable Cardiovascular status: blood pressure returned to baseline and stable Postop Assessment: no apparent nausea or vomiting Anesthetic complications: no   No complications documented.   Last Vitals:  Vitals:   10/21/20 0759 10/21/20 0931  BP: (!) 157/94 (!) 142/80  Pulse: 77 86  Resp: 14 16  Temp: 36.9 C 36.9 C  SpO2: 99% 100%    Last Pain:  Vitals:   10/21/20 0931  TempSrc: Oral  PainSc: 0-No pain                 [place] Marian C Carlester Kasparek

## 2020-10-21 NOTE — Transfer of Care (Signed)
Immediate Anesthesia Transfer of Care Note  Patient: [name]  Procedure(s) Performed: ESOPHAGOGASTRODUODENOSCOPY (EGD) WITH PROPOFOL (N/A ) BIOPSY  Patient Location: Short Stay  Anesthesia Type:General  Level of Consciousness: awake, alert  and oriented  Airway & Oxygen Therapy: Patient Spontanous Breathing  Post-op Assessment: Report given to RN and Post -op Vital signs reviewed and stable  Post vital signs: Reviewed and stable  Last Vitals:  Vitals Value Taken Time  BP    Temp    Pulse    Resp    SpO2      Last Pain:  Vitals:   10/21/20 0759  TempSrc: Oral  PainSc: 0-No pain         Complications: No complications documented.

## 2020-10-21 NOTE — Anesthesia Preprocedure Evaluation (Addendum)
Anesthesia Evaluation  Patient identified by MRN, date of birth, ID band Patient awake    Reviewed: Allergy & Precautions, NPO status , Patient's Chart, lab work & pertinent test results  Airway Mallampati: II  TM Distance: >3 FB Neck ROM: Full    Dental  (+) Dental Advisory Given   Pulmonary sleep apnea , former smoker,    Pulmonary exam normal breath sounds clear to auscultation       Cardiovascular Exercise Tolerance: Good hypertension, Pt. on medications + CAD, + Past MI, + CABG, + Peripheral Vascular Disease and +CHF (EF 55%)  Normal cardiovascular exam Rhythm:Regular Rate:Normal  21-Aug-2020 14:24:36 [facility] ROUTINE RECORD Normal sinus rhythm Inferior infarct , age undetermined ST & T wave abnormality, consider lateral ischemia Abnormal ECG No significant change since last tracing Confirmed by Glori Bickers [phone]) on 08/21/2020 9:46:12 PM   Neuro/Psych negative neurological ROS  negative psych ROS   GI/Hepatic GERD (gastroparesis?)  Medicated,  Endo/Other  diabetes, Well Controlled, Type 2, Insulin Dependent  Renal/GU Renal InsufficiencyRenal disease     Musculoskeletal  (+) Arthritis , Osteoarthritis,    Abdominal   Peds  Hematology negative hematology ROS (+) anemia ,   Anesthesia Other Findings   Reproductive/Obstetrics                            Anesthesia Physical Anesthesia Plan  ASA: III  Anesthesia Plan: General   Post-op Pain Management:    Induction: Intravenous  PONV Risk Score and Plan: Propofol infusion  Airway Management Planned: Nasal Cannula, Natural Airway and Simple Face Mask  Additional Equipment:   Intra-op Plan:   Post-operative Plan:   Informed Consent: I have reviewed the patients History and Physical, chart, labs and discussed the procedure including the risks, benefits and alternatives for the proposed anesthesia  with the patient or authorized representative who has indicated his/her understanding and acceptance.     Dental advisory given  Plan Discussed with: Surgeon and CRNA  Anesthesia Plan Comments:        Anesthesia Quick Evaluation

## 2020-10-21 NOTE — Interval H&P Note (Signed)
Patient states pantoprazole is working.  He has no new complaints. Last aspirin dose was 2 days ago. Examination is unchanged. Oropharyngeal mucosa is normal. No neck masses. Cardiac exam with regular rhythm normal S1 and S2.  No murmur or gallop noted. Auscultation lungs reveal vesicular breath sounds bilaterally. Abdomen is soft and nontender.  No organomegaly or masses. He does not have peripheral edema.   History and Physical Interval Note:  10/21/2020 8:56 AM  [name] Current  has presented today for surgery, with the diagnosis of GERD.  The various methods of treatment have been discussed with the patient and family. After consideration of risks, benefits and other options for treatment, the patient has consented to  Procedure(s) with comments: ESOPHAGOGASTRODUODENOSCOPY (EGD) WITH PROPOFOL (N/A) - 10:30 as a surgical intervention.  The patient's history has been reviewed, patient examined, no change in status, stable for surgery.  I have reviewed the patient's chart and labs.  Questions were answered to the patient's satisfaction.     [employer]

## 2020-10-21 NOTE — Anesthesia Procedure Notes (Signed)
Date/Time: 10/21/2020 9:08 AM Performed by: Orlie Dakin, CRNA Pre-anesthesia Checklist: Patient identified, Emergency Drugs available, Suction available and Patient being monitored Patient Re-evaluated:Patient Re-evaluated prior to induction Oxygen Delivery Method: Nasal cannula Induction Type: IV induction Placement Confirmation: positive ETCO2

## 2020-10-21 NOTE — Op Note (Signed)
[facility] Patient Name: Ramadan Couey Procedure Date: 10/21/2020 8:39 AM MRN: [id number] Date of Birth: November 27, 1955 Attending MD: [name] , MD CSN: [id number] Age: 65 Admit Type: Outpatient Procedure:                Upper GI endoscopy Indications:              Follow-up of gastro-esophageal reflux disease Providers:                [name], MD, Lurline Del, RN, Casimer Bilis, Technician Referring MD:             [name] [name], MD Medicines:                Propofol per Anesthesia Complications:            No immediate complications. Estimated Blood Loss:     Estimated blood loss was minimal. Procedure:                Pre-Anesthesia Assessment:                           - Prior to the procedure, a History and Physical                            was performed, and patient medications and                            allergies were reviewed. The patient's tolerance of                            previous anesthesia was also reviewed. The risks                            and benefits of the procedure and the sedation                            options and risks were discussed with the patient.                            All questions were answered, and informed consent                            was obtained. Prior Anticoagulants: The patient has                            taken no previous anticoagulant or antiplatelet                            agents except for aspirin. ASA Grade Assessment:                            III - A patient with severe systemic disease. After  reviewing the risks and benefits, the patient was                            deemed in satisfactory condition to undergo the                            procedure.                           After obtaining informed consent, the endoscope was                            passed under direct vision. Throughout the                             procedure, the patient's blood pressure, pulse, and                            oxygen saturations were monitored continuously. The                            [id number]-H190 ([id number]) scope was introduced through the                            mouth, and advanced to the second part of duodenum.                            The upper GI endoscopy was accomplished without                            difficulty. The patient tolerated the procedure                            well. Scope In: 9:06:07 AM Scope Out: 9:23:56 AM Total Procedure Duration: 0 hours 17 minutes 49 seconds  Findings:      The hypopharynx was normal.      The proximal esophagus and mid esophagus were normal.      There were esophageal mucosal changes consistent with long-segment       Barrett's esophagus present in the distal esophagus. The maximum       longitudinal extent of these mucosal changes was 4 cm in length. Mucosa       was biopsied with a cold forceps for histology in 4 quadrants at       intervals of 2 cm 1 and 3 cm proximal to the GE junction. A total of 2       specimen bottles were sent to pathology.      The Z-line was irregular and was found 37 cm from the incisors.      A 3 cm hiatal hernia was present.      A single 7 mm semi-sessile polyp with no bleeding and no stigmata of       recent bleeding was found in the gastric fundus. Biopsies were taken       with a cold forceps for histology. The pathology specimen was placed       into Bottle Number 3.  The exam of the stomach was otherwise normal.      The duodenal bulb and second portion of the duodenum were normal. Impression:               - Normal hypopharynx.                           - Normal proximal esophagus and mid esophagus.                           - Esophageal mucosal changes consistent with                            long-segment Barrett's esophagus. Biopsied.                           - Z-line irregular, 37 cm from the incisors.                            - 3 cm hiatal hernia.                           - A single gastric polyp. Biopsied.                           - Normal duodenal bulb and second portion of the                            duodenum.                           comment: proximal margin of Barretts at 34 cm. Moderate Sedation:      Per Anesthesia Care Recommendation:           - Patient has a contact number available for                            emergencies. The signs and symptoms of potential                            delayed complications were discussed with the                            patient. Return to normal activities tomorrow.                            Written discharge instructions were provided to the                            patient.                           - Resume previous diet today.                           - Continue present medications.                           -  No aspirin, ibuprofen, naproxen, or other                            non-steroidal anti-inflammatory drugs for 1 day.                           - Await pathology results.                           - Repeat upper endoscopy for surveillance based on                            pathology results. Procedure Code(s):        --- Professional ---                           [phone], Esophagogastroduodenoscopy, flexible,                            transoral; with biopsy, single or multiple Diagnosis Code(s):        --- Professional ---                           K22.8, Other specified diseases of esophagus                           K44.9, Diaphragmatic hernia without obstruction or                            gangrene                           K31.7, Polyp of stomach and duodenum                           K21.9, Gastro-esophageal reflux disease without                            esophagitis CPT copyright 2019 American Medical Association. All rights reserved. The codes documented in this report are preliminary and upon coder review may   be revised to meet current compliance requirements. [name], MD [name], MD 10/21/2020 9:34:10 AM This report has been signed electronically. Number of Addenda: 0

## 2020-10-21 NOTE — Discharge Instructions (Signed)
Resume aspirin on 10/22/2020 Resume other medications and diet as before. No driving for 24 hours. Physician will call with biopsy results.  PATIENT INSTRUCTIONS POST-ANESTHESIA  IMMEDIATELY FOLLOWING SURGERY:  Do not drive or operate machinery for the first twenty four hours after surgery.  Do not make any important decisions for twenty four hours after surgery or while taking narcotic pain medications or sedatives.  If you develop intractable nausea and vomiting or a severe headache please notify your doctor immediately.  FOLLOW-UP:  Please make an appointment with your surgeon as instructed. You do not need to follow up with anesthesia unless specifically instructed to do so.  WOUND CARE INSTRUCTIONS (if applicable):  Keep a dry clean dressing on the anesthesia/puncture wound site if there is drainage.  Once the wound has quit draining you may leave it open to air.  Generally you should leave the bandage intact for twenty four hours unless there is drainage.  If the epidural site drains for more than 36-48 hours please call the anesthesia department.  QUESTIONS?:  Please feel free to call your physician or the hospital operator if you have any questions, and they will be happy to assist you.      Barrett's Esophagus  Barrett's esophagus occurs when the tissue that lines the esophagus changes or becomes damaged. The esophagus is the tube that carries food from the throat to the stomach. With Barrett's esophagus, the cells that line the esophagus are replaced by cells that are similar to the lining of the intestines (intestinal metaplasia). Barrett's esophagus itself may not cause any symptoms. However, many people who have Barrett's esophagus also have gastroesophageal reflux disease (GERD), which may cause symptoms such as heartburn. Over time, a few people with this condition may develop cancer of the esophagus. Treatment may include medicines, procedures to destroy the abnormal cells, or  surgery. What are the causes? The exact cause of this condition is not known. In some cases, the condition develops from damage to the lining of the esophagus caused by gastroesophageal reflux disease (GERD). GERD occurs when stomach acids flow up from the stomach into the esophagus. Frequent symptoms of GERD may cause intestinal metaplasia or cause cell changes (dysplasia). What increases the risk? You are more likely to develop this condition if you:  Have GERD.  Are male.  Are of European descent.  Are obese.  Are older than 50.  Have a hiatal hernia. This is a condition in which part of your stomach bulges into your chest.  Smoke. What are the signs or symptoms? People with Barrett's esophagus often have no symptoms. However, many people with this condition also have GERD. Symptoms of GERD may include:  Heartburn.  Difficulty swallowing.  Dry cough. How is this diagnosed? This condition may be diagnosed based on:  Results of an upper gastrointestinal endoscopy. For this exam, a thin, flexible tube with a light and a camera on the end (endoscope) is passed down your esophagus. Your health care provider can view the inside of your esophagus during this procedure.  Results of a biopsy. For this procedure, several tissue samples are removed (biopsy) from your esophagus to look at under a microscope. They are then checked for intestinal metaplasia or dysplasia. How is this treated? Treatment for this condition may include:  Medicines (proton pump inhibitors, or PPIs) to decrease or stop GERD.  Periodic endoscopic exams to make sure that cancer is not developing.  A procedure or surgery for dysplasia. This may include: ? Removal  or destruction of abnormal cells. ? Removal of part of the esophagus. Follow these instructions at home: Eating and drinking  Eat more fruits and vegetables.  Avoid fatty foods.  Eat small, frequent meals instead of large meals.  Avoid foods  that cause heartburn. These foods include: ? Coffee and alcoholic drinks. ? Tomatoes and foods made with tomatoes. ? Greasy or spicy foods. ? Chocolate and peppermint.  Do not drink alcohol. General instructions  Take over-the-counter and prescription medicines only as told by your health care provider.  Do not use any products that contain nicotine or tobacco, such as cigarettes, e-cigarettes, and chewing tobacco. If you need help quitting, ask your health care provider.  If you are being treated for GERD, make sure you take medicines and follow all instructions as told by your health care provider.  Keep all follow-up visits as told by your health care provider. This is important. Contact a health care provider if:  You have heartburn or GERD symptoms.  You have difficulty swallowing. Get help right away if:  You have chest pain.  You are unable to swallow.  You vomit blood or material that looks like coffee grounds.  Your stool (feces) is bright red or dark. These symptoms may represent a serious problem that is an emergency. Do not wait to see if the symptoms will go away. Get medical help right away. Call your local emergency services (911 in the [place] Do not drive yourself to the hospital. Summary  Barrett's esophagus occurs when the tissue that lines the esophagus changes or becomes damaged.  Barrett's esophagus may be diagnosed with an upper gastrointestinal endoscopy and a biopsy.  Treatment may include medicines, procedures to remove abnormal cells, or surgery.  Follow your health care provider's instructions about what to eat and drink, what medicines to take, and when to call for help. This information is not intended to replace advice given to you by your health care provider. Make sure you discuss any questions you have with your health care provider. Document Revised: 08/17/2019 Document Reviewed: 08/17/2019 Elsevier Patient Education  2021 [employer].    Upper Endoscopy, Adult, Care After This sheet gives you information about how to care for yourself after your procedure. Your health care provider may also give you more specific instructions. If you have problems or questions, contact your health care provider. What can I expect after the procedure? After the procedure, it is common to have:  A sore throat.  Mild stomach pain or discomfort.  Bloating.  Nausea. Follow these instructions at home:  Follow instructions from your health care provider about what to eat or drink after your procedure.  Return to your normal activities as told by your health care provider. Ask your health care provider what activities are safe for you.  Take over-the-counter and prescription medicines only as told by your health care provider.  If you were given a sedative during the procedure, it can affect you for several hours. Do not drive or operate machinery until your health care provider says that it is safe.  Keep all follow-up visits as told by your health care provider. This is important.   Contact a health care provider if you have:  A sore throat that lasts longer than one day.  Trouble swallowing. Get help right away if:  You vomit blood or your vomit looks like coffee grounds.  You have: ? A fever. ? Bloody, black, or tarry stools. ? A severe sore throat  or you cannot swallow. ? Difficulty breathing. ? Severe pain in your chest or abdomen. Summary  After the procedure, it is common to have a sore throat, mild stomach discomfort, bloating, and nausea.  If you were given a sedative during the procedure, it can affect you for several hours. Do not drive or operate machinery until your health care provider says that it is safe.  Follow instructions from your health care provider about what to eat or drink after your procedure.  Return to your normal activities as told by your health care provider. This information is not  intended to replace advice given to you by your health care provider. Make sure you discuss any questions you have with your health care provider. Document Revised: 05/28/2019 Document Reviewed: 10/30/2017 Elsevier Patient Education  2021 [employer].

## 2020-10-22 DIAGNOSIS — H35372 Puckering of macula, left eye: Secondary | ICD-10-CM | POA: Diagnosis not present

## 2020-10-22 DIAGNOSIS — E113511 Type 2 diabetes mellitus with proliferative diabetic retinopathy with macular edema, right eye: Secondary | ICD-10-CM | POA: Diagnosis not present

## 2020-10-22 LAB — SURGICAL PATHOLOGY

## 2020-10-25 ENCOUNTER — Other Ambulatory Visit (INDEPENDENT_AMBULATORY_CARE_PROVIDER_SITE_OTHER): Payer: Self-pay | Admitting: Internal Medicine

## 2020-10-25 MED ORDER — PANTOPRAZOLE SODIUM 40 MG PO TBEC: 40.0000 mg | DELAYED_RELEASE_TABLET | Freq: Two times a day (BID) | ORAL | 5 refills | Status: DC

## 2020-10-27 ENCOUNTER — Encounter (HOSPITAL_COMMUNITY): Payer: Self-pay | Admitting: Internal Medicine

## 2020-11-26 DIAGNOSIS — H25813 Combined forms of age-related cataract, bilateral: Secondary | ICD-10-CM | POA: Diagnosis not present

## 2020-11-26 DIAGNOSIS — Z01818 Encounter for other preprocedural examination: Secondary | ICD-10-CM | POA: Diagnosis not present

## 2020-11-26 DIAGNOSIS — H25812 Combined forms of age-related cataract, left eye: Secondary | ICD-10-CM | POA: Diagnosis not present

## 2020-12-03 DIAGNOSIS — H25813 Combined forms of age-related cataract, bilateral: Secondary | ICD-10-CM | POA: Diagnosis not present

## 2020-12-03 DIAGNOSIS — H25812 Combined forms of age-related cataract, left eye: Secondary | ICD-10-CM | POA: Diagnosis not present

## 2020-12-07 ENCOUNTER — Telehealth: Payer: Self-pay | Admitting: Cardiovascular Disease

## 2020-12-07 DIAGNOSIS — R5383 Other fatigue: Secondary | ICD-10-CM

## 2020-12-07 NOTE — Telephone Encounter (Signed)
    2:01 PM [name] I'm still having trouble with my lack of Energy and breathing if I try to complete a task. I had an endoscopy by Dr Shonna Chock Could I possibly have an internal leak somewhere inside after my bypass surgery?  I'm not feeling like my old self. I'm tired most of the time and I have no energy to do simple tasks   Thank You for your help   [name]   Pt called and wanted to fu on this my chart message and see if [name] was going to refer him to a hematologist?  He stated he did not hear anything back ?    Best number [phone]

## 2020-12-07 NOTE — Telephone Encounter (Signed)
This RN called patient back, pt reports that he sent a MyChart message at the end of May with concerns about fatigue and possibly getting a hematology referral. Pt reports he never heard back and wanted to follow up. Pt reports he has been having continued fatigue and occasional lightheadedness, but denies shortness of breath, chest pain, or syncope at this time. This RN let the patient know his concerns would again be forwarded to Dr. Oval Linsey and his primary nurse. This RN was also able to schedule an appointment for the patient on July 14 at 11:30am with [name], NP for evaluation of fatigue. This RN advised patient he should also contact his primary care office to make them aware in case of further recommendation. This RN also told patient if he should develop chest pain, shortness of breath, or other worsening/concerning symptoms he should call 911 or be taken to the emergency room. Pt verbalized understanding.

## 2020-12-10 DIAGNOSIS — Z961 Presence of intraocular lens: Secondary | ICD-10-CM | POA: Diagnosis not present

## 2020-12-10 DIAGNOSIS — E113511 Type 2 diabetes mellitus with proliferative diabetic retinopathy with macular edema, right eye: Secondary | ICD-10-CM | POA: Diagnosis not present

## 2020-12-10 DIAGNOSIS — H25811 Combined forms of age-related cataract, right eye: Secondary | ICD-10-CM | POA: Diagnosis not present

## 2020-12-10 DIAGNOSIS — H4313 Vitreous hemorrhage, bilateral: Secondary | ICD-10-CM | POA: Diagnosis not present

## 2020-12-10 DIAGNOSIS — E113532 Type 2 diabetes mellitus with proliferative diabetic retinopathy with traction retinal detachment not involving the macula, left eye: Secondary | ICD-10-CM | POA: Diagnosis not present

## 2020-12-10 DIAGNOSIS — Z794 Long term (current) use of insulin: Secondary | ICD-10-CM | POA: Diagnosis not present

## 2020-12-11 NOTE — Telephone Encounter (Signed)
This RN called patient back, relayed the following from Dr. Oval Linsey:  [name] Latch, MD to Me  [name], LPN     0:10 PM I don't have any explanation.  If there were a leak from his surgery he would be much sicker.  Check TSH.  Keep using CPAP and see PCP about fatigue.  I don't think this is surgery or cardiac related.   TCR   Pt verbalized understanding, order for TSH placed. Pt reminded of his appointment on July 14 with [name]. Pt instructed to call 911 or go to the emergency room should he develop worsening dizziness, syncope, chest pain, shortness of breath, or any other concerning symptoms. Pt verbalized understanding.

## 2020-12-14 ENCOUNTER — Encounter (HOSPITAL_COMMUNITY): Payer: Self-pay

## 2020-12-14 ENCOUNTER — Emergency Department (HOSPITAL_COMMUNITY): Payer: Medicare Other

## 2020-12-14 ENCOUNTER — Emergency Department (HOSPITAL_COMMUNITY)
Admission: EM | Admit: 2020-12-14 | Discharge: 2020-12-14 | Disposition: A | Discharge status: Home / Self Care | Payer: Medicare Other | Attending: Emergency Medicine | Admitting: Emergency Medicine

## 2020-12-14 ENCOUNTER — Other Ambulatory Visit: Payer: Self-pay

## 2020-12-14 DIAGNOSIS — Z951 Presence of aortocoronary bypass graft: Secondary | ICD-10-CM | POA: Diagnosis not present

## 2020-12-14 DIAGNOSIS — N179 Acute kidney failure, unspecified: Secondary | ICD-10-CM | POA: Diagnosis not present

## 2020-12-14 DIAGNOSIS — I2510 Atherosclerotic heart disease of native coronary artery without angina pectoris: Secondary | ICD-10-CM | POA: Diagnosis not present

## 2020-12-14 DIAGNOSIS — Z7984 Long term (current) use of oral hypoglycemic drugs: Secondary | ICD-10-CM | POA: Diagnosis not present

## 2020-12-14 DIAGNOSIS — E1151 Type 2 diabetes mellitus with diabetic peripheral angiopathy without gangrene: Secondary | ICD-10-CM | POA: Insufficient documentation

## 2020-12-14 DIAGNOSIS — Z87891 Personal history of nicotine dependence: Secondary | ICD-10-CM | POA: Insufficient documentation

## 2020-12-14 DIAGNOSIS — I6782 Cerebral ischemia: Secondary | ICD-10-CM | POA: Insufficient documentation

## 2020-12-14 DIAGNOSIS — E782 Mixed hyperlipidemia: Secondary | ICD-10-CM | POA: Diagnosis not present

## 2020-12-14 DIAGNOSIS — I5042 Chronic combined systolic (congestive) and diastolic (congestive) heart failure: Secondary | ICD-10-CM | POA: Diagnosis not present

## 2020-12-14 DIAGNOSIS — E1122 Type 2 diabetes mellitus with diabetic chronic kidney disease: Secondary | ICD-10-CM | POA: Diagnosis not present

## 2020-12-14 DIAGNOSIS — I639 Cerebral infarction, unspecified: Secondary | ICD-10-CM

## 2020-12-14 DIAGNOSIS — Z794 Long term (current) use of insulin: Secondary | ICD-10-CM | POA: Diagnosis not present

## 2020-12-14 DIAGNOSIS — I130 Hypertensive heart and chronic kidney disease with heart failure and stage 1 through stage 4 chronic kidney disease, or unspecified chronic kidney disease: Secondary | ICD-10-CM | POA: Diagnosis not present

## 2020-12-14 DIAGNOSIS — Z7982 Long term (current) use of aspirin: Secondary | ICD-10-CM | POA: Insufficient documentation

## 2020-12-14 DIAGNOSIS — E113292 Type 2 diabetes mellitus with mild nonproliferative diabetic retinopathy without macular edema, left eye: Secondary | ICD-10-CM | POA: Insufficient documentation

## 2020-12-14 DIAGNOSIS — I6381 Other cerebral infarction due to occlusion or stenosis of small artery: Secondary | ICD-10-CM | POA: Insufficient documentation

## 2020-12-14 DIAGNOSIS — E1169 Type 2 diabetes mellitus with other specified complication: Secondary | ICD-10-CM | POA: Insufficient documentation

## 2020-12-14 DIAGNOSIS — N183 Chronic kidney disease, stage 3 unspecified: Secondary | ICD-10-CM | POA: Diagnosis not present

## 2020-12-14 DIAGNOSIS — R42 Dizziness and giddiness: Secondary | ICD-10-CM | POA: Diagnosis not present

## 2020-12-14 DIAGNOSIS — Z79899 Other long term (current) drug therapy: Secondary | ICD-10-CM | POA: Diagnosis not present

## 2020-12-14 HISTORY — DX: Cerebral infarction, unspecified: I63.9

## 2020-12-14 LAB — CBC WITH DIFFERENTIAL/PLATELET
Abs Immature Granulocytes: 0.03 10*3/uL (ref 0.00–0.07)
Basophils Absolute: 0.1 10*3/uL (ref 0.0–0.1)
Basophils Relative: 1 %
Eosinophils Absolute: 0.1 10*3/uL (ref 0.0–0.5)
Eosinophils Relative: 2 %
HCT: 36.4 % — ABNORMAL LOW (ref 39.0–52.0)
Hemoglobin: 12.2 g/dL — ABNORMAL LOW (ref 13.0–17.0)
Immature Granulocytes: 0 %
Lymphocytes Relative: 18 %
Lymphs Abs: 1.4 10*3/uL (ref 0.7–4.0)
MCH: 28.4 pg (ref 26.0–34.0)
MCHC: 33.5 g/dL (ref 30.0–36.0)
MCV: 84.8 fL (ref 80.0–100.0)
Monocytes Absolute: 0.4 10*3/uL (ref 0.1–1.0)
Monocytes Relative: 6 %
Neutro Abs: 5.9 10*3/uL (ref 1.7–7.7)
Neutrophils Relative %: 73 %
Platelets: 288 10*3/uL (ref 150–400)
RBC: 4.29 MIL/uL (ref 4.22–5.81)
RDW: 13.9 % (ref 11.5–15.5)
WBC: 8 10*3/uL (ref 4.0–10.5)
nRBC: 0 % (ref 0.0–0.2)

## 2020-12-14 LAB — BASIC METABOLIC PANEL
Anion gap: 9 (ref 5–15)
BUN: 44 mg/dL — ABNORMAL HIGH (ref 8–23)
CO2: 20 mmol/L — ABNORMAL LOW (ref 22–32)
Calcium: 9.3 mg/dL (ref 8.9–10.3)
Chloride: 107 mmol/L (ref 98–111)
Creatinine, Ser: 3.41 mg/dL — ABNORMAL HIGH (ref 0.61–1.24)
GFR, Estimated: 19 mL/min — ABNORMAL LOW (ref >60)
Glucose, Bld: 151 mg/dL — ABNORMAL HIGH (ref 70–99)
Potassium: 4.1 mmol/L (ref 3.5–5.1)
Sodium: 136 mmol/L (ref 135–145)

## 2020-12-14 LAB — TSH: TSH: 2.018 u[IU]/mL (ref 0.350–4.500)

## 2020-12-14 MED ORDER — FAMOTIDINE IN NACL 20-0.9 MG/50ML-% IV SOLN
20.0000 mg | Freq: Once | INTRAVENOUS | Status: AC
  Administered 2020-12-14: 20 mg via INTRAVENOUS
  Filled 2020-12-14: qty 50

## 2020-12-14 MED ORDER — MECLIZINE HCL 12.5 MG PO TABS: 12.5000 mg | ORAL_TABLET | Freq: Two times a day (BID) | ORAL | 0 refills | Status: DC | PRN

## 2020-12-14 MED ORDER — SODIUM CHLORIDE 0.9 % IV BOLUS
1000.0000 mL | Freq: Once | INTRAVENOUS | Status: AC
  Administered 2020-12-14: 1000 mL via INTRAVENOUS

## 2020-12-14 MED ORDER — MECLIZINE HCL 25 MG PO TABS
25.0000 mg | ORAL_TABLET | Freq: Once | ORAL | Status: AC
  Administered 2020-12-14: 25 mg via ORAL
  Filled 2020-12-14: qty 1

## 2020-12-14 NOTE — Discharge Instructions (Addendum)
Your MRI showed that you had a stroke within the last month.  This is likely causing your dizziness.  Take meclizine as needed for dizziness  Your kidney function is slightly abnormal from dehydration so please stay hydrated  Take baby aspirin daily  Follow-up with neurology for your stroke  Repeat kidney function with your doctor in a week   Return to ER if you have worse dizziness, trouble speaking, weakness, numbness

## 2020-12-14 NOTE — ED Triage Notes (Signed)
Patient complains of intermittent dizziness for the past month. States that has advised cardiologist of same and no diagnosis. No weakness, states that the dizziness occurs with positional change. No pain

## 2020-12-14 NOTE — ED Notes (Signed)
Patient transported to MRI 

## 2020-12-14 NOTE — ED Provider Notes (Signed)
[facility] EMERG[facility]Y DEPARTMENT Provider Note   CSN: [id number] Arrival date & time: 12/14/20  1328     History No chief complaint on file.   [name] is a 65 y.o. male hx of CHF, CAD, HTN, MI, DM, here with dizziness.  Patient has intermittent dizziness after he eats going on for the last month or so.  He states that he does feel loss of balance as well.  He has been talking to his cardiologist and his primary care doctor.  He had a normal TSH recently.  He states that today he ate some food and the dizziness got much worse and had a hard time standing up.  He denies actual falling but he states that he felt like he was going to fall.  No history of strokes in the past and denies any trouble speaking.  The history is provided by the patient.      Past Medical History:  Diagnosis Date   Arthritis    hips shoulders   CAD (coronary artery disease) 03/03/2019   CHF (congestive heart failure) ([place]) 2020   Chronic combined systolic and diastolic heart failure ([place]) 04/26/2018   Admitted 11/14-11/20/19-diuresed 10L   Fatigue 10/14/2020   Hypertension    Myocardial infarction [facility])    Sleep apnea    Type 2 diabetes mellitus with diabetic nephropathy ([place]) 04/23/2019    Patient Active Problem List   Diagnosis Date Noted   Fatigue 10/14/2020   GERD (gastroesophageal reflux disease) 10/06/2020   Anemia of chronic disease 10/06/2020   Complete rotator cuff tear 08/26/2020   Myalgia due to statin 03/11/2020   Arthritis of both acromioclavicular joints 08/21/2019   Peripheral arterial disease ([place]) 05/31/2019   Edema 05/27/2019   CAD (coronary artery disease) 05/27/2019   DM type 2 causing vascular disease ([place]) 04/23/2019   S/P CABG (coronary artery bypass graft) 03/25/2019   Moderate nonproliferative diabetic retinopathy of both eyes associated with type 2 diabetes mellitus ([place]) 03/18/2019   Special screening for malignant neoplasms, colon 02/04/2019    Family hx of colon cancer 02/04/2019   Cardiomyopathy ([place]) 06/08/2018   CRI (chronic renal insufficiency), stage 3 (moderate) ([place]) 06/08/2018   Chronic combined systolic and diastolic heart failure ([place]) 04/26/2018   Essential hypertension, benign 04/26/2018   Diabetic nephropathy ([place]) 04/26/2018   Mixed hyperlipidemia 04/26/2018   Elevated troponin 04/26/2018    Past Surgical History:  Procedure Laterality Date   BIOPSY  06/27/2019   Procedure: BIOPSY;  Surgeon: Rogene Houston, MD;  [facility];  Service: Endoscopy;;  ascending colon polyp   BIOPSY  10/21/2020   Procedure: BIOPSY;  Surgeon: Rogene Houston, MD;  [facility];  Service: Endoscopy;;  esophageal,gastric polyp   CARDIAC SURGERY     COLONOSCOPY N/A 06/27/2019   Procedure: COLONOSCOPY;  Surgeon: Rogene Houston, MD;  [facility];  Service: Endoscopy;  Laterality: N/A;  1030am-office rescheduled to 06/27/19 @9 :55am   CORONARY ARTERY BYPASS GRAFT N/A 03/25/2019   Procedure: CORONARY ARTERY BYPASS GRAFTING (CABG) X  4 USING LEFT INTERNAL MAMMARY ARTERY AND RIGHT SAPHENOUS VEIN GRAFTS;  Surgeon: [name], MD;  Location: [place];  Service: Open Heart Surgery;  Laterality: N/A;   ESOPHAGOGASTRODUODENOSCOPY (EGD) WITH PROPOFOL N/A 10/21/2020   Procedure: ESOPHAGOGASTRODUODENOSCOPY (EGD) WITH PROPOFOL;  Surgeon: Rogene Houston, MD;  [facility];  Service: Endoscopy;  Laterality: N/A;  10:30   EXCISION MORTON'S NEUROMA  EYE SURGERY Left    retina   POLYPECTOMY  06/27/2019   Procedure: POLYPECTOMY;  Surgeon: Rogene Houston, MD;  [facility];  Service: Endoscopy;;  proximal sigmoid colon   RIGHT/LEFT HEART CATH AND CORONARY ANGIOGRAPHY N/A 03/12/2019   Procedure: RIGHT/LEFT HEART CATH AND CORONARY ANGIOGRAPHY;  Surgeon: [name], MD;  Location: [facility];  Service: Cardiovascular;  Laterality: N/A;   SHOULDER ARTHROSCOPY WITH ROTATOR CUFF  REPAIR AND SUBACROMIAL DECOMPRESSION Right 08/26/2020   Procedure: RIGHT SHOULDER MINI OPEN ROTATOR CUFF REPAIR AND SUBACROMIAL DECOMPRESSION WITH PATCH GRAFT;  Surgeon: Susa Day, MD;  Location: [facility];  Service: Orthopedics;  Laterality: Right;  90 MINS GENERAL WITH BLOCK   TEE WITHOUT CARDIOVERSION N/A 03/25/2019   Procedure: TRANSESOPHAGEAL ECHOCARDIOGRAM (TEE);  Surgeon: [name], MD;  Location: [name];  Service: Open Heart Surgery;  Laterality: N/A;       Family History  Problem Relation Age of Onset   Colon cancer Mother    Heart Problems Father    Diabetes Father    Valvular heart disease Father     Social History   Tobacco Use   Smoking status: Former    Pack years: 0.00    Types: Cigarettes    Quit date: 07/06/1988    Years since quitting: 32.4   Smokeless tobacco: Never  Vaping Use   Vaping Use: Never used  Substance Use Topics   Alcohol use: Not Currently    Comment: socially   Drug use: No    Home Medications Prior to Admission medications   Medication Sig Start Date End Date Taking? Authorizing Provider  acetaminophen (TYLENOL) 500 MG tablet Take 1,000 mg by mouth every 6 (six) hours as needed for moderate pain or headache.     [provider]  albuterol (VENTOLIN HFA) 108 (90 Base) MCG/ACT inhaler Inhale 2 puffs into the lungs every 6 (six) hours as needed. Patient taking differently: Inhale 2 puffs into the lungs every 6 (six) hours as needed for wheezing or shortness of breath. 02/26/20   Spero Geralds, MD  amLODipine (NORVASC) 10 MG tablet Take 1 tablet (10 mg total) by mouth daily. 07/21/20 10/19/20  Skeet Latch, MD  aspirin EC 81 MG tablet Take 1 tablet (81 mg total) by mouth daily. 10/22/20   Rogene Houston, MD  Cholecalciferol (VITAMIN D-3) 125 MCG (5000 UT) TABS Take 5,000 Units by mouth daily.    [provider]  Coenzyme Q10 (COQ10 PO) Take 1 capsule by mouth daily.    [provider]  Cyanocobalamin  (B-12) 2500 MCG TABS Take 2,500 mcg by mouth daily.    [provider]  diphenhydramine-acetaminophen (TYLENOL PM) 25-500 MG TABS tablet Take 1 tablet by mouth at bedtime as needed (pain/sleep).    [provider]  docusate sodium (COLACE) 100 MG capsule Take 1 capsule (100 mg total) by mouth 2 (two) times daily as needed for mild constipation. 08/26/20   Susa Day, MD  doxazosin (CARDURA) 2 MG tablet Take 1 tablet (2 mg total) by mouth daily. Patient taking differently: Take 2 mg by mouth every evening. 07/30/20   Skeet Latch, MD  Evolocumab (REPATHA SURECLICK) 694 MG/ML SOAJ Inject 140 mg into the skin every 14 (fourteen) days. 05/06/20   Skeet Latch, MD  famotidine (PEPCID) 20 MG tablet Take 1 tablet (20 mg total) by mouth at bedtime. 10/06/20   Rogene Houston, MD  furosemide (LASIX) 40 MG tablet Take 40  mg by mouth daily.    [provider]  insulin glargine (LANTUS SOLOSTAR) 100 UNIT/ML Solostar Pen Inject 25 Units into the skin every morning. Patient taking differently: Inject 14 Units into the skin daily. 12/10/19   Renato Shin, MD  Insulin Pen Needle (B-D ULTRAFINE III SHORT PEN) 31G X 8 MM MISC 1 each by Does not apply route as directed. 05/30/19   Cassandria Anger, MD  Magnesium 100 MG CAPS Take 100 mg by mouth daily.    [provider]  Multiple Vitamin (MULTIVITAMIN WITH MINERALS) TABS tablet Take 1 tablet by mouth daily.    [provider]  pantoprazole (PROTONIX) 40 MG tablet Take 1 tablet (40 mg total) by mouth 2 (two) times daily before a meal. 10/25/20   [name], Mechele Dawley, MD  Semaglutide (OZEMPIC, 0.25 OR 0.5 MG/DOSE, [place]) Inject 0.5 mg into the skin every 7 (seven) days.    [provider]  sertraline (ZOLOFT) 100 MG tablet Take 1 tablet (100 mg total) by mouth daily. 08/19/20   [name], MD  valsartan (DIOVAN) 80 MG tablet TAKE 1 TABLET BY MOUTH EVERY DAY 08/27/20   Skeet Latch, MD    Allergies     Bee venom, Atorvastatin, and Rosuvastatin  Review of Systems   Review of Systems  Neurological:  Positive for dizziness.  All other systems reviewed and are negative.  Physical Exam Updated Vital Signs BP (!) 163/87   Pulse 79   Temp 98.3 F (36.8 C)   Resp 16   SpO2 99%   Physical Exam Vitals and nursing note reviewed.  Constitutional:      Appearance: Normal appearance.  HENT:     Head: Normocephalic.     Nose: Nose normal.     Mouth/Throat:     Mouth: Mucous membranes are moist.  Eyes:     Comments: Some nystagmus going to the right.  No obvious rotatory nystagmus   Cardiovascular:     Rate and Rhythm: Normal rate and regular rhythm.     Pulses: Normal pulses.     Heart sounds: Normal heart sounds.  Pulmonary:     Effort: Pulmonary effort is normal.     Breath sounds: Normal breath sounds.  Abdominal:     General: Abdomen is flat.     Palpations: Abdomen is soft.  Musculoskeletal:        General: Normal range of motion.     Cervical back: Normal range of motion and neck supple.  Skin:    General: Skin is warm.     Capillary Refill: Capillary refill takes less than 2 seconds.  Neurological:     General: No focal deficit present.     Mental Status: He is alert and oriented to person, place, and time.     Comments: Normal finger-to-nose bilaterally.  Normal strength and sensation bilaterally.  Psychiatric:        Mood and Affect: Mood normal.        Behavior: Behavior normal.    ED Results / Procedures / Treatments   Labs (all labs ordered are listed, but only abnormal results are displayed) Labs Reviewed  BASIC METABOLIC PANEL - Abnormal; Notable for the following components:      Result Value   CO2 20 (*)    Glucose, Bld 151 (*)    BUN 44 (*)    Creatinine, Ser 3.41 (*)    GFR, Estimated 19 (*)    All other components within normal limits  CBC WITH DIFFERENTIAL/PLATELET - Abnormal; Notable for the following components:   Hemoglobin 12.2 (*)    HCT  36.4 (*)    All other components within normal limits  TSH  URINALYSIS, ROUTINE W REFLEX MICROSCOPIC  CBG MONITORING, ED    EKG EKG Interpretation  Date/Time:  Monday December 14 2020 14:16:41 EDT Ventricular Rate:  82 PR Interval:  176 QRS Duration: 110 QT Interval:  416 QTC Calculation: 486 R Axis:   69 Text Interpretation: Sinus rhythm with occasional Premature ventricular complexes Cannot rule out Inferior infarct , age undetermined Abnormal ECG No significant change since last tracing Confirmed by Wandra Arthurs [phone]) on 12/14/2020 3:26:27 PM  Radiology MR ANGIO HEAD WO CONTRAST  Result Date: 12/14/2020 CLINICAL DATA:  Dizziness EXAM: MRI HEAD WITHOUT CONTRAST MRA HEAD WITHOUT CONTRAST TECHNIQUE: Multiplanar, multi-echo pulse sequences of the brain and surrounding structures were acquired without intravenous contrast. Angiographic images of the Circle of Willis were acquired using MRA technique without intravenous contrast. COMPARISON:  None FINDINGS: MRI HEAD Brain: There is small area of diffusion hyperintensity without restriction surrounding an area of ADC hyperintensity in the left centrum semiovale. No acute infarction or intracranial hemorrhage. There is no intracranial mass, mass effect, or edema. There is no hydrocephalus or extra-axial fluid collection. Prominence of the ventricles and sulci reflects generalized parenchymal volume loss. Patchy and confluent T2 hyperintensity in the supratentorial and pontine white matter is nonspecific probably reflects moderate to advanced chronic microvascular ischemic changes. There are chronic small vessel infarcts of the central cerebral white matter, deep gray nuclei, pons, and cerebellum. Numerous foci of susceptibility hypointensity are present in the supratentorial white matter, deep gray nuclei, and cerebellum most compatible with hypertensive chronic microhemorrhages. Vascular: Major vessel flow voids at the skull base are preserved. Skull  and upper cervical spine: Normal marrow signal is preserved. Sinuses/Orbits: Minor mucosal thickening.  Left lens replacement. Other: Sella is unremarkable.  Mastoid air cells are clear. MRA HEAD Intracranial internal carotid arteries are patent. Mild stenosis of the right paraclinoid portion. Middle and anterior cerebral arteries are patent. Intracranial vertebral arteries are patent. No significant flow is identified within right vertebral artery beyond PICA origin probably due to small caliber. There is no significant stenosis or aneurysm. 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 Moderate to advanced chronic microvascular ischemic changes. Multiple chronic lacunar infarcts and numerous foci of hypertensive chronic microhemorrhage. No proximal intracranial vessel occlusion or significant stenosis. Electronically Signed   By: Macy Mis M.D.   On: 12/14/2020 17:22   MR BRAIN WO CONTRAST  Result Date: 12/14/2020 CLINICAL DATA:  Dizziness EXAM: MRI HEAD WITHOUT CONTRAST MRA HEAD WITHOUT CONTRAST TECHNIQUE: Multiplanar, multi-echo pulse sequences of the brain and surrounding structures were acquired without intravenous contrast. Angiographic images of the Circle of Willis were acquired using MRA technique without intravenous contrast. COMPARISON:  None FINDINGS: MRI HEAD Brain: There is small area of diffusion hyperintensity without restriction surrounding an area of ADC hyperintensity in the left centrum semiovale. No acute infarction or intracranial hemorrhage. There is no intracranial mass, mass effect, or edema. There is no hydrocephalus or extra-axial fluid collection. Prominence of the ventricles and sulci reflects generalized parenchymal volume loss. Patchy and confluent T2 hyperintensity in the supratentorial and pontine white matter is nonspecific probably reflects moderate to advanced chronic microvascular ischemic changes. There are chronic small vessel infarcts of  the central cerebral white matter, deep gray nuclei, pons, and cerebellum. Numerous foci of susceptibility hypointensity  are present in the supratentorial white matter, deep gray nuclei, and cerebellum most compatible with hypertensive chronic microhemorrhages. Vascular: Major vessel flow voids at the skull base are preserved. Skull and upper cervical spine: Normal marrow signal is preserved. Sinuses/Orbits: Minor mucosal thickening.  Left lens replacement. Other: Sella is unremarkable.  Mastoid air cells are clear. MRA HEAD Intracranial internal carotid arteries are patent. Mild stenosis of the right paraclinoid portion. Middle and anterior cerebral arteries are patent. Intracranial vertebral arteries are patent. No significant flow is identified within right vertebral artery beyond PICA origin probably due to small caliber. There is no significant stenosis or aneurysm. 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 Moderate to advanced chronic microvascular ischemic changes. Multiple chronic lacunar infarcts and numerous foci of hypertensive chronic microhemorrhage. No proximal intracranial vessel occlusion or significant stenosis. Electronically Signed   By: Macy Mis M.D.   On: 12/14/2020 17:22    Procedures Procedures   Medications Ordered in ED Medications  famotidine (PEPCID) IVPB 20 mg premix (20 mg Intravenous New Bag/Given 12/14/20 1709)  sodium chloride 0.9 % bolus 1,000 mL (1,000 mLs Intravenous New Bag/Given 12/14/20 1558)  meclizine (ANTIVERT) tablet 25 mg (25 mg Oral Given 12/14/20 1616)    ED Course  I have reviewed the triage vital signs and the nursing notes.  Pertinent labs & imaging results that were available during my care of the patient were reviewed by me and considered in my medical decision making (see chart for details).    MDM Rules/Calculators/A&P                         SALIOU BARNIER is a 65 y.o. male who presented with dizziness.   Dizziness intermittently for the last month or so but got worse today.  Consider brain mass versus stroke.  Patient has baseline renal failure with a creatinine of 3.  At this point we will get MRI brain and MRA brain.  We will give meclizine and hydrate patient and get orthostatics  5:37 PM Creatinine slightly elevated at 3.4 and baseline is 3.  Patient is not orthostatic.  MRI showed small subacute to chronic infarct the left central semiovale and no obvious LVO.  Discussed case with Dr. Leonel Ramsay.  Since patient's symptoms has been going on for the last month or so, he recommend just baby aspirin and follow-up with neurology outpatient.  We will prescribe meclizine as needed for symptomatic relief.  Patient can get further outpatient evaluation by neurology.  Final Clinical Impression(s) / ED Diagnoses Final diagnoses:  None    Rx / DC Orders ED Discharge Orders     None        Drenda Freeze, MD 12/14/20 1739

## 2020-12-14 NOTE — ED Provider Notes (Signed)
Emergency Medicine Provider Triage Evaluation Note  [name] , a 65 y.o. male  was evaluated in triage.  Pt complains of dizziness.  Occurring intermittently over the past month, worse with position change, reported as room spinning and balance but also some lightheadedness.  No associated chest pain, shortness of breath or palpitations.  No numbness or weakness, headaches.  Review of Systems  Positive: Dizziness, lightheadedness Negative: Chest pain, shortness of breath, abdominal pain, syncope  Physical Exam  BP 132/85 (BP Location: Left Arm)   Pulse 79   Temp 98.3 F (36.8 C)   Resp 19   SpO2 99%  Gen:   Awake, no distress   Resp:  Normal effort  MSK:   Moves extremities without difficulty  Other:  No mediolateral exam, 5/5 strength, use intact, no facial droop  Medical Decision Making  Medically screening exam initiated at 2:12 PM.  Appropriate orders placed.  [name] was informed that the remainder of the evaluation will be completed by another provider, this initial triage assessment does not replace that evaluation, and the importance of remaining in the ED until their evaluation is complete.     [name] Larsen, PA-C 12/14/20 [name], [place], MD 12/15/20 [phone]

## 2020-12-15 ENCOUNTER — Telehealth: Payer: Self-pay | Admitting: *Deleted

## 2020-12-15 NOTE — Telephone Encounter (Signed)
Transition Care Management Follow-up Telephone Call Date of discharge and from where: 12/14/2020 - [facility] How have you been since you were released from the hospital? "I am doing okay" Any questions or concerns? No  Items Reviewed: Did the pt receive and understand the discharge instructions provided? Yes  Medications obtained and verified? Yes  Other? No  Any new allergies since your discharge? No  Dietary orders reviewed? No Do you have support at home? Yes    Functional Questionnaire: (I = Independent and D = Dependent) ADLs: I  Bathing/Dressing- I  Meal Prep- I  Eating- I  Maintaining continence- I  Transferring/Ambulation- I  Managing Meds- I  Follow up appointments reviewed:  PCP Hospital f/u appt confirmed? Yes  Scheduled to see Dr. Wolfgang Phoenix on 01/04/2021 @ 1530. [facility] f/u appt confirmed? Yes  Scheduled to see Cardiology on 12/24/2020 @ 1130 and Neurology on 01/14/2021 @ 1300. Are transportation arrangements needed? No  If their condition worsens, is the pt aware to call PCP or go to the Emergency Dept.? Yes Was the patient provided with contact information for the PCP's office or ED? Yes Was to pt encouraged to call back with questions or concerns? Yes

## 2020-12-17 DIAGNOSIS — H25811 Combined forms of age-related cataract, right eye: Secondary | ICD-10-CM | POA: Diagnosis not present

## 2020-12-24 ENCOUNTER — Encounter (HOSPITAL_BASED_OUTPATIENT_CLINIC_OR_DEPARTMENT_OTHER): Payer: Self-pay | Admitting: Family

## 2020-12-24 ENCOUNTER — Ambulatory Visit (INDEPENDENT_AMBULATORY_CARE_PROVIDER_SITE_OTHER): Payer: Medicare Other

## 2020-12-24 ENCOUNTER — Other Ambulatory Visit: Payer: Self-pay

## 2020-12-24 ENCOUNTER — Ambulatory Visit (INDEPENDENT_AMBULATORY_CARE_PROVIDER_SITE_OTHER): Payer: Medicare Other | Admitting: Family

## 2020-12-24 VITALS — BP 136/76 | HR 86 | Ht 70.0 in

## 2020-12-24 DIAGNOSIS — R5383 Other fatigue: Secondary | ICD-10-CM | POA: Diagnosis not present

## 2020-12-24 DIAGNOSIS — I951 Orthostatic hypotension: Secondary | ICD-10-CM

## 2020-12-24 DIAGNOSIS — I493 Ventricular premature depolarization: Secondary | ICD-10-CM

## 2020-12-24 DIAGNOSIS — T466X5D Adverse effect of antihyperlipidemic and antiarteriosclerotic drugs, subsequent encounter: Secondary | ICD-10-CM

## 2020-12-24 DIAGNOSIS — G720 Drug-induced myopathy: Secondary | ICD-10-CM

## 2020-12-24 DIAGNOSIS — I10 Essential (primary) hypertension: Secondary | ICD-10-CM

## 2020-12-24 DIAGNOSIS — T466X5A Adverse effect of antihyperlipidemic and antiarteriosclerotic drugs, initial encounter: Secondary | ICD-10-CM

## 2020-12-24 NOTE — Progress Notes (Signed)
Office Visit    Patient Name: [name] Date of Encounter: 12/24/2020  PCP:  [name], MD   [place]  Cardiologist:  Skeet Latch, MD Advanced Practice Provider:  No care team member to display Electrophysiologist:  None    Chief Complaint    [name] is a 65 y.o. male with a hx of lightheadedness, CVA, CHF, CAD, HTN, MI, DM2 presents today for lightheadedness  Past Medical History    Past Medical History:  Diagnosis Date   Arthritis    hips shoulders   CAD (coronary artery disease) 03/03/2019   CHF (congestive heart failure) ([place]) 2020   Chronic combined systolic and diastolic heart failure ([place]) 04/26/2018   Admitted 11/14-11/20/19-diuresed 10L   Fatigue 10/14/2020   Hypertension    Myocardial infarction [facility])    Sleep apnea    Type 2 diabetes mellitus with diabetic nephropathy ([place]) 04/23/2019   Past Surgical History:  Procedure Laterality Date   BIOPSY  06/27/2019   Procedure: BIOPSY;  Surgeon: Rogene Houston, MD;  [facility];  Service: Endoscopy;;  ascending colon polyp   BIOPSY  10/21/2020   Procedure: BIOPSY;  Surgeon: Rogene Houston, MD;  [facility];  Service: Endoscopy;;  esophageal,gastric polyp   CARDIAC SURGERY     COLONOSCOPY N/A 06/27/2019   Procedure: COLONOSCOPY;  Surgeon: Rogene Houston, MD;  [facility];  Service: Endoscopy;  Laterality: N/A;  1030am-office rescheduled to 06/27/19 @9 :55am   CORONARY ARTERY BYPASS GRAFT N/A 03/25/2019   Procedure: CORONARY ARTERY BYPASS GRAFTING (CABG) X  4 USING LEFT INTERNAL MAMMARY ARTERY AND RIGHT SAPHENOUS VEIN GRAFTS;  Surgeon: [name], MD;  Location: [place];  Service: Open Heart Surgery;  Laterality: N/A;   ESOPHAGOGASTRODUODENOSCOPY (EGD) WITH PROPOFOL N/A 10/21/2020   Procedure: ESOPHAGOGASTRODUODENOSCOPY (EGD) WITH PROPOFOL;  Surgeon: Rogene Houston, MD;  [facility];  Service: Endoscopy;   Laterality: N/A;  10:30   EXCISION MORTON'S NEUROMA     EYE SURGERY Left    retina   POLYPECTOMY  06/27/2019   Procedure: POLYPECTOMY;  Surgeon: Rogene Houston, MD;  [facility];  Service: Endoscopy;;  proximal sigmoid colon   RIGHT/LEFT HEART CATH AND CORONARY ANGIOGRAPHY N/A 03/12/2019   Procedure: RIGHT/LEFT HEART CATH AND CORONARY ANGIOGRAPHY;  Surgeon: [name], MD;  Location: [place] [facility];  Service: Cardiovascular;  Laterality: N/A;   SHOULDER ARTHROSCOPY WITH ROTATOR CUFF REPAIR AND SUBACROMIAL DECOMPRESSION Right 08/26/2020   Procedure: RIGHT SHOULDER MINI OPEN ROTATOR CUFF REPAIR AND SUBACROMIAL DECOMPRESSION WITH PATCH GRAFT;  Surgeon: Susa Day, MD;  Location: [facility];  Service: Orthopedics;  Laterality: Right;  90 MINS GENERAL WITH BLOCK   TEE WITHOUT CARDIOVERSION N/A 03/25/2019   Procedure: TRANSESOPHAGEAL ECHOCARDIOGRAM (TEE);  Surgeon: [name], MD;  Location: [place];  Service: Open Heart Surgery;  Laterality: N/A;    Allergies  Allergies  Allergen Reactions   Bee Venom Anaphylaxis   Atorvastatin     myalgia   Rosuvastatin     myalgias    History of Present Illness    [name] is a 65 y.o. male with a hx of lightheadedness, CVA, CHF, CAD, HTN, MI, DM2  last seen 10/14/2020 by Dr. Oval Linsey.   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  Blood pressure control has been difficult.  He has been intolerant to amlodipine, lisinopril, hydrochlorothiazide, beta-blocker due to sensation of fatigue.  He has been started on Lasix by Dr. Moshe Cipro which has improved his lower extremity edema.  At initial appointment he was started on doxazosin.  Follow-up with pharmacist to be started on valsartan.  He then had subsequent orthostatic blood pressures and doxazosin was reduced.  He was evaluated in the ED 12/14/2020 noting dizziness, loss of balance.  He was not orthostatic.  MRI showed small subacute to chronic infarct in left  centrum semiovale and no obvious to LVO. He was started in aspirin and recommended for outpatient follow-up with neurology.   Presents today for follow-up  with his wife.  He used to work as a Forensic psychologist for [employer].  Tells me lightheadedness and fatigue started a few months ago. Notes lightheadedness and fatigue started a few months ago. He wakes up with little energy and then quickly tires out. Tells me thi sis getting depressing as he likes to be active. Will get lightheaded with activity. Notes holding onto things to stay steady. This improves if he eats first thing in the morning prior to his medications. He drinks about 3-4 8 ounce glasses of water per day.  Blood pressure at home has routinely been systolic 062B.Notes no dizziness nor sensation of spinning. Denies palpitations. He has upcoming neurology appt 8/1/2.   EKGs/Labs/Other Studies Reviewed:   The following studies were reviewed today: Echo 04/26/2018: - Left ventricle: The cavity size was normal. There was minimal    concentric hypertrophy. Systolic function was mildly reduced. The    estimated ejection fraction was in the range of 45% to 50%.    Diffuse hypokinesis. Findings consistent with left ventricular    diastolic dysfunction, grade indeterminate. Doppler parameters    are consistent with high ventricular filling pressure.  - Mitral valve: Mildly calcified annulus. There was mild    regurgitation.  - Left atrium: The atrium was severely dilated.  - Right atrium: The atrium was mildly dilated.  - Inferior vena cava: The vessel was dilated. The respirophasic    diameter changes were blunted (< 50%), consistent with elevated    central venous pressure. Estimated CVP 15 mmHg.    Right/Left Heart Cath and Coronary Angiography 03/12/2019: Mid LAD lesion is 80% stenosed. Ost LAD to Mid LAD lesion is 50% stenosed. 1st Diag lesion is 80% stenosed. 2nd Diag lesion is 75% stenosed. Mid LM to Dist LM lesion is 25%  stenosed. Prox Cx lesion is 90% stenosed. 1st Mrg lesion is 80% stenosed. 2nd Mrg lesion is 80% stenosed. Dist RCA lesion is 90% stenosed. Mid RCA lesion is 75% stenosed. RPAV lesion is 80% stenosed. LV end diastolic pressure is moderately elevated. There is no aortic valve stenosis. Hemodynamic findings consistent with mild pulmonary hypertension.   Severe multivessel CAD.  Plan for outpatient evaluation for CABG>  His symptoms are all with exertion.  He feels fine at rest.  I instructed him to avoid any strenuous activity, or anything that would bring on shortness of breath.  Continue aspirin and Imdur.   Continue Crestor.    Echo 03/15/2019  1. Left ventricular ejection fraction, by visual estimation, is 45 to  50%. The left ventricle has mildly decreased function. There is mildly  increased left ventricular hypertrophy.   2. Left ventricular diastolic Doppler parameters are consistent with  impaired relaxation pattern of LV diastolic filling.   3. Global right ventricle has normal systolic function.The right  ventricular size is normal. No increase in right ventricular wall  thickness.   4. Left  atrial size was severely dilated.   5. Right atrial size was normal.   6. Mild mitral annular calcification.   7. Moderate calcification of the mitral valve leaflet(s).   8. Moderate thickening of the mitral valve leaflet(s).   9. The mitral valve is abnormal. Trace mitral valve regurgitation. No  evidence of mitral stenosis.  10. The tricuspid valve is normal in structure. Tricuspid valve  regurgitation is trivial.  11. The aortic valve is tricuspid Aortic valve regurgitation was not  visualized by color flow Doppler. Mild aortic valve sclerosis without  stenosis.  12. The pulmonic valve was not well visualized. Pulmonic valve  regurgitation is mild by color flow Doppler.  13. Mildly elevated pulmonary artery systolic pressure.   VAS US DOPPLER PRE CABG 03/21/2019:  Right Carotid:  Velocities in the right ICA are consistent with a 1-39%  stenosis.   Left Carotid: There is no evidence of stenosis in the left ICA.  Vertebrals: Bilateral vertebral arteries demonstrate antegrade flow.   Bilateral Extremity: Doppler waveforms remain within normal limits with  compression bilaterally for the radial arteries. Doppler waveforms remain  within normal limits with compression bilaterally for the ulnar arteries.   ECHO INTRAOPERATIVE TEE 03/25/2019: POST-OP IMPRESSIONS  - Aorta: The aorta appears unchanged from pre-bypass.  - Left Atrial Appendage: The left atrial appendage appears unchanged from  pre-bypass.  - Aortic Valve: The aortic valve appears unchanged from pre-bypass.  - Mitral Valve: The mitral valve appears unchanged from pre-bypass.  - Tricuspid Valve: The tricuspid valve appears unchanged from pre-bypass.  - Pericardium: The pericardium appears unchanged from pre-bypass.   VAS [place] LOWER EXTREMITY VENOUS 03/30/2019: Right: There is no evidence of deep vein thrombosis in the lower  extremity.  Left: No evidence of common femoral vein obstruction.   Echo 05/29/2019 1. Left ventricular ejection fraction, by visual estimation, is 50 to  55%. The left ventricle has low normal function. There is mildly increased  left ventricular hypertrophy.   2. Basal anterolateral segment, basal inferior segment, and basal  inferolateral segment are abnormal.   3. The left ventricle demonstrates regional wall motion abnormalities.   4. Global right ventricle has mildly reduced systolic function.The right  ventricular size is normal. No increase in right ventricular wall  thickness.   5. Left atrial size was moderately dilated.   6. Right atrial size was normal.   7. Moderate mitral annular calcification.   8. The mitral valve is grossly normal. Trivial mitral valve  regurgitation.   9. The tricuspid valve is grossly normal. Tricuspid valve regurgitation  is trivial.  10.  The aortic valve is tricuspid. Aortic valve regurgitation is not  visualized.  11. The pulmonic valve was grossly normal. Pulmonic valve regurgitation is  trivial.  12. Normal pulmonary artery systolic pressure.  13. The tricuspid regurgitant velocity is 2.30 m/s, and with an assumed  right atrial pressure of 3 mmHg, the estimated right ventricular systolic  pressure is normal at 24.2 mmHg.  14. The inferior vena cava is normal in size with greater than 50%  respiratory variability, suggesting right atrial pressure of 3 mmHg.     Echo 09/2019:  1. Left ventricular ejection fraction, by estimation, is 55 to 60%. The  left ventricle has normal function. The left ventricle has no regional  wall motion abnormalities. There is moderate left ventricular hypertrophy.  Left ventricular diastolic  parameters are consistent with Grade I diastolic dysfunction (impaired  relaxation).   2. Right ventricular systolic function  is normal. The right ventricular  size is normal.   3. Left atrial size was severely dilated.   4. The mitral valve is normal in structure. Trivial mitral valve  regurgitation. No evidence of mitral stenosis.   5. The aortic valve was not well visualized. Aortic valve regurgitation  is not visualized. No aortic stenosis is present.   6. The inferior vena cava is normal in size with greater than 50%  respiratory variability, suggesting right atrial pressure of 3 mmHg.    EKG:  EKG is ordered today.  The ekg ordered today demonstrates NSR 86 bpm with frequent PVC and stable prior inferior infarct.   Recent Labs: 12/31/2019: B Natriuretic Peptide 1,409.0 10/19/2020: ALT 9 12/14/2020: BUN 44; Creatinine, Ser 3.41; Hemoglobin 12.2; Platelets 288; Potassium 4.1; Sodium 136; TSH 2.018  Recent Lipid Panel    Component Value Date/Time   CHOL 265 (H) 01/09/2020 1335   TRIG 234 (H) 01/09/2020 1335   HDL 44 01/09/2020 1335   CHOLHDL 6.0 (H) 01/09/2020 1335   CHOLHDL 3.9 04/26/2018  1011   VLDL 20 04/26/2018 1011   LDLCALC 177 (H) 01/09/2020 1335    Home Medications   No outpatient medications have been marked as taking for the 12/24/20 encounter (Office Visit) with Loel Dubonnet, NP.     Review of Systems      All other systems reviewed and are otherwise negative except as noted above.  Physical Exam    VS:  There were no vitals taken for this visit. , BMI There is no height or weight on file to calculate BMI.  Wt Readings from Last 3 Encounters:  10/19/20 199 lb (90.3 kg)  10/14/20 208 lb 3.2 oz (94.4 kg)  10/06/20 203 lb 1.6 oz (92.1 kg)     GEN: Well nourished, well developed, in no acute distress. HEENT: normal. Neck: Supple, no JVD, carotid bruits, or masses. Cardiac: RRR, no murmurs, rubs, or gallops. No clubbing, cyanosis, edema.  Radials/PT 2+ and equal bilaterally.  Respiratory:  Respirations regular and unlabored, clear to auscultation bilaterally. GI: Soft, nontender, nondistended. MS: No deformity or atrophy. Skin: Warm and dry, no rash. Neuro:  Strength and sensation are intact. Psych: Normal affect.  Assessment & Plan    CAD - Stable with no anginal symptoms. No indication for ischemic evaluation.  GDMT includes aspirin, repatha.   PVC -frequent PVC noted on EKG today.  Possibly contributory to fatigue.  7-day ZIO monitor placed today to assess PVC burden. Recent TSH, electrolytes, TSH unremarkable. Future consideration includes addition of BB vs CCB.   Fatigue / Lightheadedness -noted significant orthostatic blood pressure drop in clinic today.  Unable to accurately assess heart rate response given frequent PVCs.  We will stop Lasix at this would likely contributory to orthostatic symptoms and he will report worsening dyspnea or lower extremity edema. Recent TSH 12/14/20 normal, CBC 12/14/20 Hb 12.2 which is overall stable at baseline. Future considerations include sleep study.  HTN-careful titration of antihypertensive regimen in the  setting of orthostatic hypotension.  Blood pressure reasonably well controlled today.  Home monitoring encouraged.  History of CVA- upcoming evaluation with neurology.continue repatha, aspirin.   Disposition: Follow up in 5 week(s) with Dr. Oval Linsey or APP.  Signed, Loel Dubonnet, NP 12/24/2020, 11:59 AM [place]

## 2020-12-24 NOTE — Patient Instructions (Addendum)
Medication Instructions:  Your physician has recommended you make the following change in your medication:   STOP Furosemide (Lasix) This is likely causing shifts in your blood pressure called orthostatic hypotension which causes lightheadedness.  *If you need a refill on your cardiac medications before your next appointment, please call your pharmacy*  Lab Work: None ordered today  Testing/Procedures:  Your EKG today showed normal sinus rhythm with frequent early beats called PVC's.   Your physician has recommended that you wear a Zio monitor for 1 week  This monitor is a medical device that records the heart's electrical activity. Doctors most often use these monitors to diagnose arrhythmias. Arrhythmias are problems with the speed or rhythm of the heartbeat. The monitor is a small device applied to your chest. You can wear one while you do your normal daily activities. While wearing this monitor if you have any symptoms to push the button and record what you felt. Once you have worn this monitor for the period of time provider prescribed (Usually 14 days), you will return the monitor device in the postage paid box. Once it is returned they will download the data collected and provide [place] with a report which the provider will then review and we will call you with those results. Important tips:  Avoid showering during the first 24 hours of wearing the monitor. Avoid excessive sweating to help maximize wear time. Do not submerge the device, no hot tubs, and no swimming pools. Keep any lotions or oils away from the patch. After 24 hours you may shower with the patch on. Take brief showers with your back facing the shower head.  Do not remove patch once it has been placed because that will interrupt data and decrease adhesive wear time. Push the button when you have any symptoms and write down what you were feeling. Once you have completed wearing your monitor, remove and place into box which  has postage paid and place in your outgoing mailbox.  If for some reason you have misplaced your box then call our office and we can provide another box and/or mail it off for you.   Follow-Up: At [facility], you and your health needs are our priority.  As part of our continuing mission to provide you with exceptional heart care, we have created designated Provider Care Teams.  These Care Teams include your primary Cardiologist (physician) and Advanced Practice Providers (APPs -  Physician Assistants and Nurse Practitioners) who all work together to provide you with the care you need, when you need it.  We recommend signing up for the patient portal called "MyChart".  Sign up information is provided on this After Visit Summary.  MyChart is used to connect with patients for Virtual Visits (Telemedicine).  Patients are able to view lab/test results, encounter notes, upcoming appointments, etc.  Non-urgent messages can be sent to your provider as well.   To learn more about what you can do with MyChart, go to [email or web address].    Your next appointment:   As scheduled with Loel Dubonnet, NP    Other Instructions  Orthostatic Hypotension Blood pressure is a measurement of how strongly, or weakly, your blood is pressing against the walls of your arteries. Orthostatic hypotension is a sudden drop in blood pressure that happens when you quickly change positions,such as when you get up from sitting or lying down. Arteries are blood vessels that carry blood from your heart throughout your body. When blood pressure is too low, you  may not get enough blood to your brain or to the rest of your organs. This can cause weakness, light-headedness, rapid heartbeat, and fainting. This can last for just a few seconds or for up to a few minutes. Orthostatic hypotension is usually not a serious problem. However, if it happens frequently or gets worse, it may be a sign of somethingmore serious. What are  the causes? This condition may be caused by: Sudden changes in posture, such as standing up quickly after you have been sitting or lying down. Blood loss. Loss of body fluids (dehydration). Heart problems. Hormone (endocrine) problems. Pregnancy. Severe infection. Lack of certain nutrients. Severe allergic reactions (anaphylaxis). Certain medicines, such as blood pressure medicine or medicines that make the body lose excess fluids (diuretics). Sometimes, this condition can be caused by not taking medicine as directed, such as taking too much of a certain medicine. What increases the risk? The following factors may make you more likely to develop this condition: Age. Risk increases as you get older. Conditions that affect the heart or the central nervous system. Taking certain medicines, such as blood pressure medicine or diuretics. Being pregnant. What are the signs or symptoms? Symptoms of this condition may include: Weakness. Light-headedness. Dizziness. Blurred vision. Fatigue. Rapid heartbeat. Fainting, in severe cases. How is this diagnosed? This condition is diagnosed based on: Your medical history. Your symptoms. Your blood pressure measurement. Your health care provider will check your blood pressure when you are: Lying down. Sitting. Standing. A blood pressure reading is recorded as two numbers, such as "120 over 80" (or 120/80). The first ("top") number is called the systolic pressure. It is a measure of the pressure in your arteries as your heart beats. The second ("bottom") number is called the diastolic pressure. It is a measure of the pressure in your arteries when your heart relaxes between beats. Blood pressure is measured in a unit called mm Hg. Healthy blood pressure for most adults is 120/80. If your blood pressure is below 90/60, you may be diagnosed withhypotension. Other information or tests that may be used to diagnose orthostatic hypotension include: Your  other vital signs, such as your heart rate and temperature. Blood tests. Tilt table test. For this test, you will be safely secured to a table that moves you from a lying position to an upright position. Your heart rhythm and blood pressure will be monitored during the test. How is this treated? This condition may be treated by: Changing your diet. This may involve eating more salt (sodium) or drinking more water. Taking medicines to raise your blood pressure. Changing the dosage of certain medicines you are taking that might be lowering your blood pressure. Wearing compression stockings. These stockings help to prevent blood clots and reduce swelling in your legs. In some cases, you may need to go to the hospital for: Fluid replacement. This means you will receive fluids through an IV. Blood replacement. This means you will receive donated blood through an IV (transfusion). Treating an infection or heart problems, if this applies. Monitoring. You may need to be monitored while medicines that you are taking wear off. Follow these instructions at home: Eating and drinking  Drink enough fluid to keep your urine pale yellow. Eat a healthy diet, and follow instructions from your health care provider about eating or drinking restrictions. A healthy diet includes: Fresh fruits and vegetables. Whole grains. Lean meats. Low-fat dairy products. Eat extra salt only as directed. Do not add extra salt to your  diet unless your health care provider told you to do that. Eat frequent, small meals. Avoid standing up suddenly after eating.  Medicines Take over-the-counter and prescription medicines only as told by your health care provider. Follow instructions from your health care provider about changing the dosage of your current medicines, if this applies. Do not stop or adjust any of your medicines on your own. General instructions  Wear compression stockings as told by your health care  provider. Get up slowly from lying down or sitting positions. This gives your blood pressure a chance to adjust. Avoid hot showers and excessive heat as directed by your health care provider. Return to your normal activities as told by your health care provider. Ask your health care provider what activities are safe for you. Do not use any products that contain nicotine or tobacco, such as cigarettes, e-cigarettes, and chewing tobacco. If you need help quitting, ask your health care provider. Keep all follow-up visits as told by your health care provider. This is important.  Contact a health care provider if you: Vomit. Have diarrhea. Have a fever for more than 2-3 days. Feel more thirsty than usual. Feel weak and tired. Get help right away if you: Have chest pain. Have a fast or irregular heartbeat. Develop numbness in any part of your body. Cannot move your arms or your legs. Have trouble speaking. Become sweaty or feel light-headed. Faint. Feel short of breath. Have trouble staying awake. Feel confused. Summary Orthostatic hypotension is a sudden drop in blood pressure that happens when you quickly change positions. Orthostatic hypotension is usually not a serious problem. It is diagnosed by having your blood pressure taken lying down, sitting, and then standing. It may be treated by changing your diet or adjusting your medicines. This information is not intended to replace advice given to you by your health care provider. Make sure you discuss any questions you have with your healthcare provider. Document Revised: 11/23/2017 Document Reviewed: 11/23/2017 Elsevier Patient Education  2022 [place].  Premature Ventricular Contraction  A premature ventricular contraction (PVC) is a common kind of irregular heartbeat (arrhythmia). These contractions are extra heartbeats that start in the ventricles of the heart and occur too early in the normal sequence. During the PVC, the  heart's normal electrical pathway is not used, so the beat is shorter and less effective. In most cases, these contractions come and go and do not requiretreatment. What are the causes? Common causes of the condition include: Smoking. Drinking alcohol. Certain medicines. Some illegal drugs. Stress. Caffeine. Certain medical conditions can also cause PVCs: Heart failure. Heart attack, or coronary artery disease. Heart valve problems. Changes in minerals in the blood (electrolytes). Low blood oxygen levels or high carbon dioxide levels. In many cases, the cause of this condition is not known. What are the signs or symptoms? The main symptom of this condition is fast or skipped heartbeats (palpitations). Other symptoms include: Chest pain. Shortness of breath. Feeling tired. Dizziness. Difficulty exercising. In some cases, there are no symptoms. How is this diagnosed? This condition may be diagnosed based on: Your medical history. A physical exam. During the exam, the health care provider will check for irregular heartbeats. Tests, such as: An ECG (electrocardiogram) to monitor the electrical activity of your heart. An ambulatory cardiac monitor. This device records your heartbeats for 24 hours or more. Stress tests to see how exercise affects your heart rhythm and blood supply. An echocardiogram. This test uses sound waves (ultrasound) to produce an  image of your heart. An electrophysiology study (EPS). This test checks for electrical problems in your heart. How is this treated? Treatment for this condition depends on any underlying conditions, the type of PVCs that you are having, and how much the symptoms are interfering with yourdaily life. Possible treatments include: Avoiding things that cause premature contractions (triggers). These include caffeine and alcohol. Taking medicines if symptoms are severe or if the extra heartbeats are frequent. Getting treatment for underlying  conditions that cause PVCs. Having an implantable cardioverter defibrillator (ICD), if you are at risk for a serious arrhythmia. The ICD is a small device that is inserted into your chest to monitor your heartbeat. When it senses an irregular heartbeat, it sends a shock to bring the heartbeat back to normal. Having a procedure to destroy the portion of the heart tissue that sends out abnormal signals (catheter ablation). In some cases, no treatment is required. Follow these instructions at home: Lifestyle Do not use any products that contain nicotine or tobacco, such as cigarettes, e-cigarettes, and chewing tobacco. If you need help quitting, ask your health care provider. Do not use illegal drugs. Exercise regularly. Ask your health care provider what type of exercise is safe for you. Try to get at least 7-9 hours of sleep each night, or as much as recommended by your health care provider. Find healthy ways to manage stress. Avoid stressful situations when possible. Alcohol use Do not drink alcohol if: Your health care provider tells you not to drink. You are pregnant, may be pregnant, or are planning to become pregnant. Alcohol triggers your episodes. If you drink alcohol: Limit how much you use to: 0-1 drink a day for women. 0-2 drinks a day for men. Be aware of how much alcohol is in your drink. In the [place] one drink equals one 12 oz bottle of beer (355 mL), one 5 oz glass of wine (148 mL), or one 1 oz glass of hard liquor (44 mL). General instructions Take over-the-counter and prescription medicines only as told by your health care provider. If caffeine triggers episodes of PVC, do not eat, drink, or use anything with caffeine in it. Keep all follow-up visits as told by your health care provider. This is important. Contact a health care provider if you: Feel palpitations. Get help right away if you: Have chest pain. Have shortness of breath. Have sweating for no reason. Have  nausea and vomiting. Become light-headed or you faint. Summary A premature ventricular contraction (PVC) is a common kind of irregular heartbeat (arrhythmia). In most cases, these contractions come and go and do not require treatment. You may need to wear an ambulatory cardiac monitor. This records your heartbeats for 24 hours or more. Treatment depends on any underlying conditions, the type of PVCs that you are having, and how much the symptoms are interfering with your daily life. This information is not intended to replace advice given to you by your health care provider. Make sure you discuss any questions you have with your healthcare provider. Document Revised: 02/22/2018 Document Reviewed: 02/22/2018 Elsevier Patient Education  2022 [employer].

## 2020-12-25 ENCOUNTER — Ambulatory Visit (INDEPENDENT_AMBULATORY_CARE_PROVIDER_SITE_OTHER): Payer: Medicare Other | Admitting: Family Medicine

## 2020-12-25 VITALS — BP 156/86 | HR 87 | Temp 97.4°F | Ht 70.0 in | Wt 207.0 lb

## 2020-12-25 DIAGNOSIS — E785 Hyperlipidemia, unspecified: Secondary | ICD-10-CM

## 2020-12-25 DIAGNOSIS — E1159 Type 2 diabetes mellitus with other circulatory complications: Secondary | ICD-10-CM | POA: Diagnosis not present

## 2020-12-25 DIAGNOSIS — E1169 Type 2 diabetes mellitus with other specified complication: Secondary | ICD-10-CM

## 2020-12-25 DIAGNOSIS — Z8673 Personal history of transient ischemic attack (TIA), and cerebral infarction without residual deficits: Secondary | ICD-10-CM | POA: Diagnosis not present

## 2020-12-25 NOTE — Progress Notes (Signed)
   Subjective:    Patient ID: [name], male    DOB: May 16, 1956, 65 y.o.   MRN: [id number]  HPIhospital follow up.  Very nice patient Recently in the ER Had a CAT scan and then an MRI and this MRI showed multiple lacunar infarcts.  Patient is diabetic hypertensive being followed by hypertensive clinic.  Also has underlying renal disease.  Mild obesity.  Patient trying to eat healthy and stay active    Review of Systems     Objective:   Physical Exam  Lungs clear heart regular neck no masses HEENT benign neurologic grossly normal      Assessment & Plan:  History of strokes Needs to do up-to-date A1c and lipid Importance of keeping blood pressure cholesterol A1c under good control Carotid ultrasound order Follow through with hypertensive clinic on a regular basis Eat healthy Continue medications

## 2020-12-26 ENCOUNTER — Encounter (HOSPITAL_BASED_OUTPATIENT_CLINIC_OR_DEPARTMENT_OTHER): Payer: Self-pay | Admitting: Family

## 2020-12-26 LAB — LIPID PANEL
Chol/HDL Ratio: 3.5 ratio (ref 0.0–5.0)
Cholesterol, Total: 168 mg/dL (ref 100–199)
HDL: 48 mg/dL (ref >39)
LDL Chol Calc (NIH): 76 mg/dL (ref 0–99)
Triglycerides: 272 mg/dL — ABNORMAL HIGH (ref 0–149)
VLDL Cholesterol Cal: 44 mg/dL — ABNORMAL HIGH (ref 5–40)

## 2020-12-26 LAB — HEMOGLOBIN A1C
Est. average glucose Bld gHb Est-mCnc: 140 mg/dL
Hgb A1c MFr Bld: 6.5 % — ABNORMAL HIGH (ref 4.8–5.6)

## 2021-01-04 ENCOUNTER — Ambulatory Visit: Payer: Medicare HMO | Admitting: Family Medicine

## 2021-01-05 DIAGNOSIS — I493 Ventricular premature depolarization: Secondary | ICD-10-CM | POA: Diagnosis not present

## 2021-01-11 ENCOUNTER — Ambulatory Visit (INDEPENDENT_AMBULATORY_CARE_PROVIDER_SITE_OTHER): Payer: Medicare Other | Admitting: Neurology

## 2021-01-11 ENCOUNTER — Encounter: Payer: Self-pay | Admitting: Neurology

## 2021-01-11 VITALS — BP 138/85 | HR 77 | Ht 70.0 in | Wt 202.4 lb

## 2021-01-11 DIAGNOSIS — Z8673 Personal history of transient ischemic attack (TIA), and cerebral infarction without residual deficits: Secondary | ICD-10-CM

## 2021-01-11 DIAGNOSIS — G4733 Obstructive sleep apnea (adult) (pediatric): Secondary | ICD-10-CM

## 2021-01-11 DIAGNOSIS — Z789 Other specified health status: Secondary | ICD-10-CM

## 2021-01-11 NOTE — Patient Instructions (Signed)
It was nice to see you both again today.  As discussed, you have had prior old small strokes which we call lacunar strokes and 1 that has been more recent, probably in the past 1 to 2 months.    As discussed, secondary prevention is key after a stroke. This means: taking care of blood sugar values or diabetes management (A1c goal of less than 7.0), good blood pressure (hypertension) control and optimizing cholesterol management (with LDL goal of less than 70), exercising daily or regularly within your own mobility limitations of course, and overall cardiovascular risk factor reduction, which includes screening for and treatment of obstructive sleep apnea (OSA) and weight management.   Your labs are good.  Please check with Dr. Lance Sell office as to the status of your neck artery ultrasound, this is called carotid Doppler ultrasound.  Also, would like for you to discuss an updated echocardiogram with your cardiologist, you have an upcoming Wednesday at with cardiology.  Your last echocardiogram was from April last year and you also recently had a heart monitor, results are pending.  I would like for you to increase her aspirin to 325 mg daily.  Please check with Dr. [name] office if this is okay from a heart standpoint as well.  Please try to get back on your ASV as treatment of sleep apnea is going to be part of your stroke prevention.  We will cancel this weeks sleep apnea follow-up appointment and have you come back in about 3 months.

## 2021-01-11 NOTE — Progress Notes (Signed)
Subjective:    Patient ID: [name] is a 65 y.o. male.  HPI    Interim history:   [name] is a 65 year old right-handed gentleman with an underlying medical history of diabetes, hypertension, coronary artery disease with status post four-vessel bypass in 03/2019, CHF, and obesity, OSA on ASV, who presents for a New problem visit of a subacute stroke.  He is referred by the emergency room.  He is accompanied by his girlfriend, [name].  He reports no sudden onset of one-sided weakness or numbness or tingling or droopy face or slurring of speech but he has had intermittent fatigue and dizziness and just global weakness and lack of stamina over the past several months.  He has not fallen recently.  He has not been sleeping very well.  He has had interim cataract surgery.  He has been taking a baby aspirin.  Primary care ordered a carotid ultrasound but he has not had it yet.  He has not been scheduled yet.  He has a follow-up with cardiology and sees them regularly.  He did have a heart monitor for about a week but results are pending.  Of note, he  he presented to the ER on 12/14/2020 with intermittent dizziness going on for about a month with exacerbation reported on 12/14/2020.  I reviewed the emergency room records.  He had a brain MRI without contrast and MR angiogram of the head without contrast on 12/14/2020 and I reviewed the results: IMPRESSION: Small subacute to chronic infarct of the left centrum semiovale.   Moderate to advanced chronic microvascular ischemic changes. Multiple chronic lacunar infarcts and numerous foci of hypertensive chronic microhemorrhage.   No proximal intracranial vessel occlusion or significant stenosis.   I last saw him on 07/14/20, at which time  he was still adjusting to his ASV.  He was slightly suboptimal with his compliance at the time.  His current ASV compliance is low, in the past 30 days he only used his machine 4 days between 12/08/2020 and  01/06/2021, residual AHI at goal at 3.3, leak on the low side.  In the past 90 days he used his machine 23 days with percent use days greater than 4 hours at 21% only.  He reports that he had lapses in treatment because of several issues.  He was having a hard time with finding a good fit with his mask.  He had cataract surgeries in June and July and his eyes were sensitive.  He is motivated to get back on his ASV.  He also had some issues with reflux and regurgitation.  He has seen GI.  He is on medication for reflux.    The patient's allergies, current medications, family history, past medical history, past social history, past surgical history and problem list were reviewed and updated as appropriate.      Previously:    I first met him at the request of his cardiology nurse practitioner on 02/06/2020, at which time he reported snoring and excessive daytime somnolence.  His girlfriend had noticed pauses in his breathing while he was asleep.  He was advised to proceed with sleep testing.  He had a baseline sleep study followed by a titration study.  Baseline sleep study from 02/21/2020 showed a sleep efficiency of 84.6%, sleep latency of 60 minutes, REM sleep was absent.  Total AHI was 33.3/h, average oxygen saturation 92%, nadir was 85%.  The absence of REM sleep likely underestimated his sleep disordered breathing.  He was  advised to return for a full night titration study.  He had this on 03/11/2020.  Sleep efficiency was 86.8%, sleep latency 22.5 minutes, REM latency ~12.5 minutes.  He was fitted with a full facemask and started on CPAP therapy and titrated to a pressure of 12 cm.  His AHI was highly elevated at the time including secondary to central apneas.  He was changed to standard BiPAP therapy of 13/9 centimeters and due to ongoing central apneas a backup rate was added.  He was eventually switched to ASV with significant improvement of his sleep disordered breathing.  On the final ASV setting of  12 cm EPAP with minimum pressure support of 3 cm and maximum pressure support of 13 cm his AHI was 0/h, O2 nadir 87% with nonsupine REM sleep achieved.  Based on his test results he was advised to start ASV therapy at home.  His set up date was 04/27/2020.       I reviewed his ASV compliance data from the past 30 days from 06/13/2020 through 07/12/2020, during which time he used his machine 24 days with percent use days greater than 4 hours slightly suboptimal at 60%, average usage of 5 hours and 48 minutes for days on treatment, residual AHI at goal at 0.7/h, ASV of 10 cm EPAP.  Leak on the low side with a 95th percentile at 1 L/min.  He called in the interim in early December 2021 requesting a pressure reduction and I reduced his EPAP from 12 cm to 10 cm at the time.   02/06/20: (He) reports snoring and excessive daytime somnolence.  I reviewed your office note from 01/03/2020.  His Epworth sleepiness score is 5 out of 24, fatigue severity score is 61 out of 63.  His girlfriend has noted pauses in his breathing while he is asleep, but this was more so in the past.  His snoring used to be louder but since his bypass surgery he seems to snore less.  His bedtime is around 9 and rise time around 730.  He has nocturia about once per average night and denies recurrent morning headaches.  He has had pain in both shoulders and has seen Dr. Maxie Better for this.  He has received shots in both shoulders.  His blood pressure has been trending higher and he was on multiple medications, lately, with recent medication changes, his blood pressure has improved.  He has no family history of sleep apnea as far as he knows.  He is single and lives alone.  He has no pets.  He has no children.  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 He is experiencing fatigue and shortness of breath with minimal exertion.  He has not had cardiac rehab yet, it was postponed.   His Past Medical History Is Significant For: Past Medical  History:  Diagnosis Date   Arthritis    hips shoulders   CAD (coronary artery disease) 03/03/2019   CHF (congestive heart failure) ([place]) 2020   Chronic combined systolic and diastolic heart failure ([place]) 04/26/2018   Admitted 11/14-11/20/19-diuresed 10L   Fatigue 10/14/2020   Hypertension    Myocardial infarction [facility])    Sleep apnea    Type 2 diabetes mellitus with diabetic nephropathy ([place]) 04/23/2019    His Past Surgical History Is Significant For: Past Surgical History:  Procedure Laterality Date   BIOPSY  06/27/2019   Procedure: BIOPSY;  Surgeon: Rogene Houston, MD;  [facility];  Service: Endoscopy;;  ascending colon polyp   BIOPSY  10/21/2020   Procedure: BIOPSY;  Surgeon: Rogene Houston, MD;  [facility];  Service: Endoscopy;;  esophageal,gastric polyp   CARDIAC SURGERY     CATARACT EXTRACTION     COLONOSCOPY N/A 06/27/2019   Procedure: COLONOSCOPY;  Surgeon: Rogene Houston, MD;  [facility];  Service: Endoscopy;  Laterality: N/A;  1030am-office rescheduled to 06/27/19 _0 :55am   CORONARY ARTERY BYPASS GRAFT N/A 03/25/2019   Procedure: CORONARY ARTERY BYPASS GRAFTING (CABG) X  4 USING LEFT INTERNAL MAMMARY ARTERY AND RIGHT SAPHENOUS VEIN GRAFTS;  Surgeon: [name], MD;  Location: [place];  Service: Open Heart Surgery;  Laterality: N/A;   ESOPHAGOGASTRODUODENOSCOPY (EGD) WITH PROPOFOL N/A 10/21/2020   Procedure: ESOPHAGOGASTRODUODENOSCOPY (EGD) WITH PROPOFOL;  Surgeon: Rogene Houston, MD;  [facility];  Service: Endoscopy;  Laterality: N/A;  10:30   EXCISION MORTON'S NEUROMA     EYE SURGERY Left    retina   POLYPECTOMY  06/27/2019   Procedure: POLYPECTOMY;  Surgeon: Rogene Houston, MD;  [facility];  Service: Endoscopy;;  proximal sigmoid colon   RIGHT/LEFT HEART CATH AND CORONARY ANGIOGRAPHY N/A 03/12/2019   Procedure: RIGHT/LEFT HEART CATH AND CORONARY ANGIOGRAPHY;  Surgeon: [name],  MD;  Location: [place] [facility];  Service: Cardiovascular;  Laterality: N/A;   SHOULDER ARTHROSCOPY WITH ROTATOR CUFF REPAIR AND SUBACROMIAL DECOMPRESSION Right 08/26/2020   Procedure: RIGHT SHOULDER MINI OPEN ROTATOR CUFF REPAIR AND SUBACROMIAL DECOMPRESSION WITH PATCH GRAFT;  Surgeon: Susa Day, MD;  Location: [facility];  Service: Orthopedics;  Laterality: Right;  90 MINS GENERAL WITH BLOCK   TEE WITHOUT CARDIOVERSION N/A 03/25/2019   Procedure: TRANSESOPHAGEAL ECHOCARDIOGRAM (TEE);  Surgeon: [name], MD;  Location: [place];  Service: Open Heart Surgery;  Laterality: N/A;    His Family History Is Significant For: Family History  Problem Relation Age of Onset   Colon cancer Mother    Heart Problems Father    Diabetes Father    Valvular heart disease Father     His Social History Is Significant For: Social History   Socioeconomic History   Marital status: Single    Spouse name: Not on file   Number of children: Not on file   Years of education: Not on file   Highest education level: Not on file  Occupational History   Not on file  Tobacco Use   Smoking status: Former    Types: Cigarettes    Quit date: 07/06/1988    Years since quitting: 32.5   Smokeless tobacco: Never  Vaping Use   Vaping Use: Never used  Substance and Sexual Activity   Alcohol use: Not Currently    Comment: socially   Drug use: No   Sexual activity: Not on file  Other Topics Concern   Not on file  Social History Narrative   Not on file   Social Determinants of Health   Financial Resource Strain: Not on file  Food Insecurity: Not on file  Transportation Needs: Not on file  Physical Activity: Not on file  Stress: Not on file  Social Connections: Not on file    His Allergies Are:  Allergies  Allergen Reactions   Bee Venom Anaphylaxis   Atorvastatin     myalgia   Rosuvastatin     myalgias  :   His Current Medications Are:  Outpatient Encounter Medications as of 01/11/2021   Medication Sig  acetaminophen (TYLENOL) 500 MG tablet Take 1,000 mg by mouth every 6 (six) hours as needed for moderate pain or headache.    albuterol (VENTOLIN HFA) 108 (90 Base) MCG/ACT inhaler Inhale 2 puffs into the lungs every 6 (six) hours as needed. (Patient taking differently: Inhale 2 puffs into the lungs every 6 (six) hours as needed for wheezing or shortness of breath.)   amLODipine (NORVASC) 10 MG tablet Take 1 tablet (10 mg total) by mouth daily.   aspirin EC 81 MG tablet Take 1 tablet (81 mg total) by mouth daily.   Cholecalciferol (VITAMIN D-3) 125 MCG (5000 UT) TABS Take 5,000 Units by mouth daily.   Coenzyme Q10 (COQ10 PO) Take 1 capsule by mouth daily.   Cyanocobalamin (B-12) 2500 MCG TABS Take 2,500 mcg by mouth daily.   diphenhydramine-acetaminophen (TYLENOL PM) 25-500 MG TABS tablet Take 1 tablet by mouth at bedtime as needed (pain/sleep).   docusate sodium (COLACE) 100 MG capsule Take 1 capsule (100 mg total) by mouth 2 (two) times daily as needed for mild constipation.   doxazosin (CARDURA) 2 MG tablet Take 1 tablet (2 mg total) by mouth daily. (Patient taking differently: Take 2 mg by mouth every evening.)   Evolocumab (REPATHA SURECLICK) 476 MG/ML SOAJ Inject 140 mg into the skin every 14 (fourteen) days.   famotidine (PEPCID) 20 MG tablet Take 1 tablet (20 mg total) by mouth at bedtime.   furosemide (LASIX) 40 MG tablet Take 40 mg by mouth daily as needed.   insulin glargine (LANTUS SOLOSTAR) 100 UNIT/ML Solostar Pen Inject 25 Units into the skin every morning. (Patient taking differently: Inject 14 Units into the skin daily.)   Insulin Pen Needle (B-D ULTRAFINE III SHORT PEN) 31G X 8 MM MISC 1 each by Does not apply route as directed.   lidocaine-prilocaine (EMLA) cream as needed.   Magnesium 100 MG CAPS Take 100 mg by mouth daily.   meclizine (ANTIVERT) 12.5 MG tablet Take 1 tablet (12.5 mg total) by mouth 2 (two) times daily as needed for dizziness.   Multiple  Vitamin (MULTIVITAMIN WITH MINERALS) TABS tablet Take 1 tablet by mouth daily.   pantoprazole (PROTONIX) 40 MG tablet Take 1 tablet (40 mg total) by mouth 2 (two) times daily before a meal.   Semaglutide (OZEMPIC, 0.25 OR 0.5 MG/DOSE, [place]) Inject 0.5 mg into the skin every 7 (seven) days.   sertraline (ZOLOFT) 100 MG tablet Take 1 tablet (100 mg total) by mouth daily.   valsartan (DIOVAN) 80 MG tablet TAKE 1 TABLET BY MOUTH EVERY DAY   No facility-administered encounter medications on file as of 01/11/2021.  :  Review of Systems:  Out of a complete 14 point review of systems, all are reviewed and negative with the exception of these symptoms as listed below:   Review of Systems  Neurological:        CVA.  Seen also for cpap as appt this Thursday for that.    Objective:  Neurological Exam  Physical Exam Physical Examination:   Vitals:   01/11/21 0802  BP: 138/85  Pulse: 77    General Examination: The patient is a very pleasant 65 y.o. male in no acute distress. He appears well-developed and well-nourished and well groomed.   HEENT: Normocephalic, atraumatic, pupils are equal, round and reactive to light, extraocular tracking is well preserved.  Hearing is grossly intact, right eyelid droopiness, seems stable.  Face is symmetric with normal facial animation otherwise.  Airway examination reveals mild to  moderate mouth dryness, adequate dental hygiene and moderate airway crowding.  No carotid bruits.   Chest: Clear to auscultation without wheezing, rhonchi or crackles noted.   Heart: S1+S2+0, regular and normal without murmurs, rubs or gallops noted.   Abdomen: Soft, non-tender and non-distended.   Extremities: There is no pitting edema in the distal lower extremities bilaterally.   Skin: Warm and dry without trophic changes noted.   Musculoskeletal: exam reveals decreased mobility in both shoulders.      Neurologically: Mental status: The patient is awake, alert and oriented  in all 4 spheres. His immediate and remote memory, attention, language skills and fund of knowledge are appropriate. There is no evidence of aphasia, agnosia, apraxia or anomia. Speech is clear with normal prosody and enunciation. Thought process is linear. Mood is normal and affect is normal. Cranial nerves II - XII are as described above under HEENT exam. Motor exam: Normal bulk, strength and tone is noted. There is no tremor, fine motor skills and coordination: grossly intact. Cerebellar testing: No dysmetria or intention tremor. There is no truncal or gait ataxia. Sensory exam: intact to light touch in the upper and lower extremities. He stands without difficulty, posture is age-appropriate, he walks without a limp.   Assessment and Plan:    In summary, [name] is a very pleasant 65 year old male with an underlying medical history of diabetes, hypertension, coronary artery disease with status post four-vessel bypass, CHF, prior lacunar strokes and recent subacute stroke in the left centrum semiovale, who presents for evaluation of his stroke.  He has been diagnosed with sleep apnea and had a sleep study in September 2021 which showed severe obstructive sleep apnea with absence of REM sleep at the time.  He had a difficult titration also in September 2021 and failed CPAP and standard BiPAP therapy, had significant central apneas which eventually were alleviated with ASV.  He is currently not fully compliant with ASV.  We talked about his stroke risk factors.  We talked about secondary prevention as well.  This includes treating sleep apnea.  He is advised to try to get back on his ASV.  He is motivated to restart therapy.  In addition, he is advised to increase his baby aspirin to a regular aspirin of 325 mg strength.  He is advised to touch base with his cardiologist and keep his appointment as scheduled.  I would like to make sure that it is okay for him to be on an adult size aspirin.  He is  furthermore advised to talk to cardiology about an updated echocardiogram.  His primary care physician has ordered a carotid Doppler ultrasound but he is not scheduled yet.  He is encouraged to talk to Dr. Lance Sell office about scheduling this.  We talked about the importance of healthy lifestyle and exercise as well today.  He is advised to follow-up routinely in 3 months, sooner if needed.  I answered all their questions today and the patient and his girlfriend were in agreement.   I spent 40 minutes in total face-to-face time and in reviewing records during pre-charting, more than 50% of which was spent in counseling and coordination of care, reviewing test results, reviewing medications and treatment regimen and/or in discussing or reviewing the diagnosis of OSA, stroke, the prognosis and treatment options. Pertinent laboratory and imaging test results that were available during this visit with the patient were reviewed by me and considered in my medical decision making (see chart for details).

## 2021-01-14 ENCOUNTER — Ambulatory Visit: Payer: Medicaid Other | Admitting: Adult Health

## 2021-01-14 DIAGNOSIS — I25810 Atherosclerosis of coronary artery bypass graft(s) without angina pectoris: Secondary | ICD-10-CM | POA: Diagnosis not present

## 2021-01-14 DIAGNOSIS — Z794 Long term (current) use of insulin: Secondary | ICD-10-CM | POA: Diagnosis not present

## 2021-01-14 DIAGNOSIS — N1832 Chronic kidney disease, stage 3b: Secondary | ICD-10-CM | POA: Diagnosis not present

## 2021-01-14 DIAGNOSIS — E1165 Type 2 diabetes mellitus with hyperglycemia: Secondary | ICD-10-CM | POA: Diagnosis not present

## 2021-01-16 ENCOUNTER — Other Ambulatory Visit: Payer: Self-pay | Admitting: Cardiovascular Disease

## 2021-01-18 NOTE — Telephone Encounter (Signed)
Refused patients refill for Doxazosin 4mg . Patient is now on Doxazosin 2mg . Call pharmacy to notify them of medication changes. Pharmacy states rx was transferred to another pharmacy and if they were going to refill it he would need  new prescription. Called patient to so clarity what phone he was using. Message says he is not accepting calls right now.

## 2021-01-19 ENCOUNTER — Other Ambulatory Visit: Payer: Self-pay | Admitting: Family Medicine

## 2021-01-21 DIAGNOSIS — Z8673 Personal history of transient ischemic attack (TIA), and cerebral infarction without residual deficits: Secondary | ICD-10-CM | POA: Diagnosis not present

## 2021-01-25 ENCOUNTER — Other Ambulatory Visit: Payer: Self-pay

## 2021-01-25 ENCOUNTER — Ambulatory Visit (INDEPENDENT_AMBULATORY_CARE_PROVIDER_SITE_OTHER): Payer: Medicare Other | Admitting: Family Medicine

## 2021-01-25 VITALS — BP 153/83 | HR 89 | Ht 70.0 in | Wt 206.4 lb

## 2021-01-25 DIAGNOSIS — E1169 Type 2 diabetes mellitus with other specified complication: Secondary | ICD-10-CM | POA: Diagnosis not present

## 2021-01-25 DIAGNOSIS — E785 Hyperlipidemia, unspecified: Secondary | ICD-10-CM | POA: Diagnosis not present

## 2021-01-25 DIAGNOSIS — I10 Essential (primary) hypertension: Secondary | ICD-10-CM | POA: Diagnosis not present

## 2021-01-25 MED ORDER — EPINEPHRINE 0.3 MG/0.3ML IJ SOAJ: 0.3000 mg | INTRAMUSCULAR | 4 refills | Status: DC | PRN

## 2021-01-25 NOTE — Progress Notes (Signed)
   Subjective:    Patient ID: [name], male    DOB: [birth year], 65 y.o.   MRN: [id number]  Hypertension This is a chronic problem. The current episode started more than 1 year ago. Risk factors for coronary artery disease include diabetes mellitus and dyslipidemia. Treatments tried: norvasc, cardura, lasix.   Needs script for an Epi Pen Patient's heart disease is stable Diabetes stable being followed by specialist    Review of Systems     Objective:   Physical Exam Lungs clear heart regular diabetic foot exam completed pulse normal BP mildly elevated       Assessment & Plan:  BP elevated to follow-up with cardiology soon they will make additional adjustments  Diabetes followed by specialist  EpiPen and proper use was dispensed today for bee stings only call 911 if using EpiPen

## 2021-01-26 ENCOUNTER — Telehealth: Payer: Self-pay | Admitting: Family Medicine

## 2021-01-26 ENCOUNTER — Telehealth: Payer: Self-pay | Admitting: Neurology

## 2021-01-26 DIAGNOSIS — G720 Drug-induced myopathy: Secondary | ICD-10-CM | POA: Insufficient documentation

## 2021-01-26 DIAGNOSIS — T466X5A Adverse effect of antihyperlipidemic and antiarteriosclerotic drugs, initial encounter: Secondary | ICD-10-CM | POA: Insufficient documentation

## 2021-01-26 NOTE — Telephone Encounter (Signed)
I received patient's carotid ultrasound report.  He had a carotid Doppler and [facility] on 01/21/2021.  Impression: No significant flow restrictive stenosis by NASCET criteria in either carotid bifurcation.  FYI, nothing further needed.

## 2021-01-27 ENCOUNTER — Other Ambulatory Visit: Payer: Self-pay

## 2021-01-27 ENCOUNTER — Encounter (HOSPITAL_BASED_OUTPATIENT_CLINIC_OR_DEPARTMENT_OTHER): Payer: Self-pay | Admitting: Family

## 2021-01-27 ENCOUNTER — Ambulatory Visit (INDEPENDENT_AMBULATORY_CARE_PROVIDER_SITE_OTHER): Payer: Medicare Other | Admitting: Family

## 2021-01-27 VITALS — BP 154/104 | HR 81 | Ht 70.0 in | Wt 206.0 lb

## 2021-01-27 DIAGNOSIS — I493 Ventricular premature depolarization: Secondary | ICD-10-CM | POA: Diagnosis not present

## 2021-01-27 DIAGNOSIS — R42 Dizziness and giddiness: Secondary | ICD-10-CM

## 2021-01-27 DIAGNOSIS — R0600 Dyspnea, unspecified: Secondary | ICD-10-CM

## 2021-01-27 DIAGNOSIS — R0609 Other forms of dyspnea: Secondary | ICD-10-CM

## 2021-01-27 DIAGNOSIS — N1832 Chronic kidney disease, stage 3b: Secondary | ICD-10-CM | POA: Diagnosis not present

## 2021-01-27 DIAGNOSIS — T466X5D Adverse effect of antihyperlipidemic and antiarteriosclerotic drugs, subsequent encounter: Secondary | ICD-10-CM | POA: Diagnosis not present

## 2021-01-27 DIAGNOSIS — R5383 Other fatigue: Secondary | ICD-10-CM | POA: Diagnosis not present

## 2021-01-27 DIAGNOSIS — I10 Essential (primary) hypertension: Secondary | ICD-10-CM

## 2021-01-27 DIAGNOSIS — G720 Drug-induced myopathy: Secondary | ICD-10-CM

## 2021-01-27 DIAGNOSIS — I471 Supraventricular tachycardia: Secondary | ICD-10-CM

## 2021-01-27 DIAGNOSIS — I129 Hypertensive chronic kidney disease with stage 1 through stage 4 chronic kidney disease, or unspecified chronic kidney disease: Secondary | ICD-10-CM | POA: Diagnosis not present

## 2021-01-27 DIAGNOSIS — E1122 Type 2 diabetes mellitus with diabetic chronic kidney disease: Secondary | ICD-10-CM | POA: Diagnosis not present

## 2021-01-27 DIAGNOSIS — T466X5A Adverse effect of antihyperlipidemic and antiarteriosclerotic drugs, initial encounter: Secondary | ICD-10-CM

## 2021-01-27 DIAGNOSIS — N189 Chronic kidney disease, unspecified: Secondary | ICD-10-CM | POA: Diagnosis not present

## 2021-01-27 MED ORDER — DILTIAZEM HCL ER COATED BEADS 240 MG PO CP24: 240.0000 mg | ORAL_CAPSULE | Freq: Every day | ORAL | 1 refills | Status: DC

## 2021-01-27 NOTE — Progress Notes (Signed)
Office Visit    Patient Name: [name] Date of Encounter: 01/27/2021  PCP:  [name], MD   [place]  Cardiologist:  Skeet Latch, MD Advanced Practice Provider:  No care team member to display Electrophysiologist:  None    Chief Complaint    [name] is a 65 y.o. male with a hx of lightheadedness, CVA, CHF, CAD, HTN, MI, DM2 presents today for follow-up after ZIO  Past Medical History    Past Medical History:  Diagnosis Date   Arthritis    hips shoulders   CAD (coronary artery disease) 03/03/2019   CHF (congestive heart failure) ([place]) 2020   Chronic combined systolic and diastolic heart failure ([place]) 04/26/2018   Admitted 11/14-11/20/19-diuresed 10L   Fatigue 10/14/2020   Hypertension    Myocardial infarction [facility])    Sleep apnea    Type 2 diabetes mellitus with diabetic nephropathy ([place]) 04/23/2019   Past Surgical History:  Procedure Laterality Date   BIOPSY  06/27/2019   Procedure: BIOPSY;  Surgeon: Rogene Houston, MD;  [facility];  Service: Endoscopy;;  ascending colon polyp   BIOPSY  10/21/2020   Procedure: BIOPSY;  Surgeon: Rogene Houston, MD;  [facility];  Service: Endoscopy;;  esophageal,gastric polyp   CARDIAC SURGERY     CATARACT EXTRACTION     COLONOSCOPY N/A 06/27/2019   Procedure: COLONOSCOPY;  Surgeon: Rogene Houston, MD;  [facility];  Service: Endoscopy;  Laterality: N/A;  1030am-office rescheduled to 06/27/19 @9 :55am   CORONARY ARTERY BYPASS GRAFT N/A 03/25/2019   Procedure: CORONARY ARTERY BYPASS GRAFTING (CABG) X  4 USING LEFT INTERNAL MAMMARY ARTERY AND RIGHT SAPHENOUS VEIN GRAFTS;  Surgeon: [name], MD;  Location: [place];  Service: Open Heart Surgery;  Laterality: N/A;   ESOPHAGOGASTRODUODENOSCOPY (EGD) WITH PROPOFOL N/A 10/21/2020   Procedure: ESOPHAGOGASTRODUODENOSCOPY (EGD) WITH PROPOFOL;  Surgeon: Rogene Houston, MD;  [facility];  Service: Endoscopy;  Laterality: N/A;  10:30   EXCISION MORTON'S NEUROMA     EYE SURGERY Left    retina   POLYPECTOMY  06/27/2019   Procedure: POLYPECTOMY;  Surgeon: Rogene Houston, MD;  [facility];  Service: Endoscopy;;  proximal sigmoid colon   RIGHT/LEFT HEART CATH AND CORONARY ANGIOGRAPHY N/A 03/12/2019   Procedure: RIGHT/LEFT HEART CATH AND CORONARY ANGIOGRAPHY;  Surgeon: [name], MD;  Location: [place] [facility];  Service: Cardiovascular;  Laterality: N/A;   SHOULDER ARTHROSCOPY WITH ROTATOR CUFF REPAIR AND SUBACROMIAL DECOMPRESSION Right 08/26/2020   Procedure: RIGHT SHOULDER MINI OPEN ROTATOR CUFF REPAIR AND SUBACROMIAL DECOMPRESSION WITH PATCH GRAFT;  Surgeon: Susa Day, MD;  Location: [facility];  Service: Orthopedics;  Laterality: Right;  90 MINS GENERAL WITH BLOCK   TEE WITHOUT CARDIOVERSION N/A 03/25/2019   Procedure: TRANSESOPHAGEAL ECHOCARDIOGRAM (TEE);  Surgeon: [name], MD;  Location: [place];  Service: Open Heart Surgery;  Laterality: N/A;    Allergies  Allergies  Allergen Reactions   Bee Venom Anaphylaxis   Atorvastatin     myalgia   Rosuvastatin     myalgias    History of Present Illness    [name] is a 65 y.o. 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  Blood pressure control has been difficult.  He has been intolerant to amlodipine, lisinopril, hydrochlorothiazide, beta-blocker due  to sensation of fatigue.  He has been started on Lasix by Dr. Moshe Cipro which has improved his lower extremity edema.  At initial appointment he was started on doxazosin.  Follow-up with pharmacist to be started on valsartan.  He then had subsequent orthostatic blood pressures and doxazosin was reduced.  He was evaluated in the ED 12/14/2020 noting dizziness, loss of balance.  He was not orthostatic.  MRI showed small subacute to chronic  infarct in left centrum semiovale and no obvious to LVO. He was started in aspirin and recommended for outpatient follow-up with neurology.  Seen in follow-up 7/40/22.  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  Noted a few month history of lightheadedness and fatigue.  He was recommended for ZIO monitor.  He did see neurology 01/11/2021 and was recommended for aggressive secondary prevention.  His aspirin was increased to 325 mg daily.  Echocardiogram was requested.  Carotid duplex 01/21/21 at outside provider with no significant stenosis bilaterally.  Preliminary ZIO monitor report shows predominantly normal sinus rhythm with average heart rate 83 bpm.  3 runs of VT the fastest 5 beats at 179 bpm and longest 5 beats at 120 bpm.  He had 10 episodes of SVT the fastest being 12 beats at 179 bpm and the longest being 18 beats at 148 bpm.  SVT was detected within 45 seconds of symptomatic events.  He also had occasional PVCs with burden of 3.4%.  He has carotid duplex upcoming 02/03/2021.  He presents today for follow-up with his wife. Teels me his lightheaded spells have gradually been improving. Does still note occasional lightheadedness. Reports not checking BP at home. Reports dyspnea on exertion is overall stable. No chest pain, pressure, tightness. We reviewed preliminary report of his ZIO monitor.    EKGs/Labs/Other Studies Reviewed:   The following studies were reviewed today:  Carotid duplex 01/21/21  Right: Mild to moderate partially calcified plaque. No significant stenosis.  Left: Mild partially calcified plaque. No signifcant stenosis.   Echo 04/26/2018: - Left ventricle: The cavity size was normal. There was minimal    concentric hypertrophy. Systolic function was mildly reduced. The    estimated ejection fraction was in the range of 45% to 50%.    Diffuse hypokinesis. Findings consistent with left ventricular    diastolic dysfunction, grade indeterminate. Doppler  parameters    are consistent with high ventricular filling pressure.  - Mitral valve: Mildly calcified annulus. There was mild    regurgitation.  - Left atrium: The atrium was severely dilated.  - Right atrium: The atrium was mildly dilated.  - Inferior vena cava: The vessel was dilated. The respirophasic    diameter changes were blunted (< 50%), consistent with elevated    central venous pressure. Estimated CVP 15 mmHg.    Right/Left Heart Cath and Coronary Angiography 03/12/2019: Mid LAD lesion is 80% stenosed. Ost LAD to Mid LAD lesion is 50% stenosed. 1st Diag lesion is 80% stenosed. 2nd Diag lesion is 75% stenosed. Mid LM to Dist LM lesion is 25% stenosed. Prox Cx lesion is 90% stenosed. 1st Mrg lesion is 80% stenosed. 2nd Mrg lesion is 80% stenosed. Dist RCA lesion is 90% stenosed. Mid RCA lesion is 75% stenosed. RPAV lesion is 80% stenosed. LV end diastolic pressure is moderately elevated. There is no aortic valve stenosis. Hemodynamic findings consistent with mild pulmonary hypertension.   Severe multivessel CAD.  Plan for outpatient evaluation for CABG>  His symptoms are all with exertion.  He feels  fine at rest.  I instructed him to avoid any strenuous activity, or anything that would bring on shortness of breath.  Continue aspirin and Imdur.   Continue Crestor.    Echo 03/15/2019  1. Left ventricular ejection fraction, by visual estimation, is 45 to  50%. The left ventricle has mildly decreased function. There is mildly  increased left ventricular hypertrophy.   2. Left ventricular diastolic Doppler parameters are consistent with  impaired relaxation pattern of LV diastolic filling.   3. Global right ventricle has normal systolic function.The right  ventricular size is normal. No increase in right ventricular wall  thickness.   4. Left atrial size was severely dilated.   5. Right atrial size was normal.   6. Mild mitral annular calcification.   7. Moderate  calcification of the mitral valve leaflet(s).   8. Moderate thickening of the mitral valve leaflet(s).   9. The mitral valve is abnormal. Trace mitral valve regurgitation. No  evidence of mitral stenosis.  10. The tricuspid valve is normal in structure. Tricuspid valve  regurgitation is trivial.  11. The aortic valve is tricuspid Aortic valve regurgitation was not  visualized by color flow Doppler. Mild aortic valve sclerosis without  stenosis.  12. The pulmonic valve was not well visualized. Pulmonic valve  regurgitation is mild by color flow Doppler.  13. Mildly elevated pulmonary artery systolic pressure.   VAS US DOPPLER PRE CABG 03/21/2019:  Right Carotid: Velocities in the right ICA are consistent with a 1-39%  stenosis.   Left Carotid: There is no evidence of stenosis in the left ICA.  Vertebrals: Bilateral vertebral arteries demonstrate antegrade flow.   Bilateral Extremity: Doppler waveforms remain within normal limits with  compression bilaterally for the radial arteries. Doppler waveforms remain  within normal limits with compression bilaterally for the ulnar arteries.   ECHO INTRAOPERATIVE TEE 03/25/2019: POST-OP IMPRESSIONS  - Aorta: The aorta appears unchanged from pre-bypass.  - Left Atrial Appendage: The left atrial appendage appears unchanged from  pre-bypass.  - Aortic Valve: The aortic valve appears unchanged from pre-bypass.  - Mitral Valve: The mitral valve appears unchanged from pre-bypass.  - Tricuspid Valve: The tricuspid valve appears unchanged from pre-bypass.  - Pericardium: The pericardium appears unchanged from pre-bypass.   VAS [place] LOWER EXTREMITY VENOUS 03/30/2019: Right: There is no evidence of deep vein thrombosis in the lower  extremity.  Left: No evidence of common femoral vein obstruction.   Echo 05/29/2019 1. Left ventricular ejection fraction, by visual estimation, is 50 to  55%. The left ventricle has low normal function. There is mildly  increased  left ventricular hypertrophy.   2. Basal anterolateral segment, basal inferior segment, and basal  inferolateral segment are abnormal.   3. The left ventricle demonstrates regional wall motion abnormalities.   4. Global right ventricle has mildly reduced systolic function.The right  ventricular size is normal. No increase in right ventricular wall  thickness.   5. Left atrial size was moderately dilated.   6. Right atrial size was normal.   7. Moderate mitral annular calcification.   8. The mitral valve is grossly normal. Trivial mitral valve  regurgitation.   9. The tricuspid valve is grossly normal. Tricuspid valve regurgitation  is trivial.  10. The aortic valve is tricuspid. Aortic valve regurgitation is not  visualized.  11. The pulmonic valve was grossly normal. Pulmonic valve regurgitation is  trivial.  12. Normal pulmonary artery systolic pressure.  13. The tricuspid regurgitant velocity is 2.30 m/s,  and with an assumed  right atrial pressure of 3 mmHg, the estimated right ventricular systolic  pressure is normal at 24.2 mmHg.  14. The inferior vena cava is normal in size with greater than 50%  respiratory variability, suggesting right atrial pressure of 3 mmHg.     Echo 09/2019:  1. Left ventricular ejection fraction, by estimation, is 55 to 60%. The  left ventricle has normal function. The left ventricle has no regional  wall motion abnormalities. There is moderate left ventricular hypertrophy.  Left ventricular diastolic  parameters are consistent with Grade I diastolic dysfunction (impaired  relaxation).   2. Right ventricular systolic function is normal. The right ventricular  size is normal.   3. Left atrial size was severely dilated.   4. The mitral valve is normal in structure. Trivial mitral valve  regurgitation. No evidence of mitral stenosis.   5. The aortic valve was not well visualized. Aortic valve regurgitation  is not visualized. No aortic  stenosis is present.   6. The inferior vena cava is normal in size with greater than 50%  respiratory variability, suggesting right atrial pressure of 3 mmHg.    EKG:  EKG is ordered today.  The ekg ordered today demonstrates NSR 86 bpm with frequent PVC and stable prior inferior infarct.   Recent Labs: 10/19/2020: ALT 9 12/14/2020: BUN 44; Creatinine, Ser 3.41; Hemoglobin 12.2; Platelets 288; Potassium 4.1; Sodium 136; TSH 2.018  Recent Lipid Panel    Component Value Date/Time   CHOL 168 12/25/2020 1208   TRIG 272 (H) 12/25/2020 1208   HDL 48 12/25/2020 1208   CHOLHDL 3.5 12/25/2020 1208   CHOLHDL 3.9 04/26/2018 1011   VLDL 20 04/26/2018 1011   LDLCALC 76 12/25/2020 1208    Home Medications   No outpatient medications have been marked as taking for the 01/27/21 encounter (Appointment) with Loel Dubonnet, NP.     Review of Systems      All other systems reviewed and are otherwise negative except as noted above.  Physical Exam    VS:  There were no vitals taken for this visit. , BMI There is no height or weight on file to calculate BMI.  Wt Readings from Last 3 Encounters:  01/25/21 206 lb 6.4 oz (93.6 kg)  01/11/21 202 lb 6.4 oz (91.8 kg)  12/25/20 207 lb (93.9 kg)     GEN: Well nourished, well developed, in no acute distress. HEENT: normal. Neck: Supple, no JVD, carotid bruits, or masses. Cardiac: RRR, no murmurs, rubs, or gallops. No clubbing, cyanosis, edema.  Radials/PT 2+ and equal bilaterally.  Respiratory:  Respirations regular and unlabored, clear to auscultation bilaterally. GI: Soft, nontender, nondistended. MS: No deformity or atrophy. Skin: Warm and dry, no rash. Neuro:  Strength and sensation are intact. Psych: Normal affect.  Assessment & Plan    CAD - Stable with no anginal symptoms. No indication for ischemic evaluation.  GDMT includes aspirin, repatha.   PVC / SVT / VT -Recent TSH, electrolytes, TSH unremarkable. Preliminary ZIO report with 3.4  % PVC burden and 10 episodes of symptomatic SVT. Stop Amlodipine. Start Dilitazem 240mg  QD  Fatigue / Lightheadedness - Likely etiology SVT. Management as above. Recent TSH 12/14/20 normal, CBC 12/14/20 Hb 12.2 which is overall stable at baseline.   HTN- BP elevated. Stopped Doxazosin as pharmacy told him he was not supposed to take - unclear why as 1 year fill was provided 07/2020. Given baseline renal dysfunction and CKD, will defer resuming  at this time. If BP not controlled on Diltiazem., could consider resuming. Stop Amlodipine to avoid duplicate therapy.   HFrEF - Euvolemic and well compensated on exam. Most recent echo normal LVEF. Repeat echo for monitoring.   History of CVA- Follows with neurology.continue repatha, aspirin 325mg  QD.  CKD - Follows with Dr. Clover Mealy of nephrology. Careful titration of diuretic and antihypertensive.    Disposition: Follow up in 3 month(s) with Dr. Oval Linsey or APP.  Signed, Loel Dubonnet, NP 01/27/2021, 12:59 PM [place]

## 2021-01-27 NOTE — Patient Instructions (Addendum)
Medication Instructions:  Your physician has recommended you make the following change in your medication:   STOP Amlodipine  START Diltiazem 240mg  once daily  We will defer restarting your Doxazosin until we see how your blood pressure  *If you need a refill on your cardiac medications before your next appointment, please call your pharmacy*   Lab Work: None ordered today    Testing/Procedures: Your ZIO monitor showed 10 episodes of SVT which is a fast heart rate which can cause lightheadedness.   Your physician has requested that you have an echocardiogram. ON Thursday, AUGUST 25 @ 1:50 PM. Echocardiography is a painless test that uses sound waves to create images of your heart. It provides your doctor with information about the size and shape of your heart and how well your heart's chambers and valves are working. This procedure takes approximately one hour. There are no restrictions for this procedure.   Follow-Up: At [facility], you and your health needs are our priority.  As part of our continuing mission to provide you with exceptional heart care, we have created designated Provider Care Teams.  These Care Teams include your primary Cardiologist (physician) and Advanced Practice Providers (APPs -  Physician Assistants and Nurse Practitioners) who all work together to provide you with the care you need, when you need it.  We recommend signing up for the patient portal called "MyChart".  Sign up information is provided on this After Visit Summary.  MyChart is used to connect with patients for Virtual Visits (Telemedicine).  Patients are able to view lab/test results, encounter notes, upcoming appointments, etc.  Non-urgent messages can be sent to your provider as well.   To learn more about what you can do with MyChart, go to [email or web address].    Your next appointment:   3 month(s) on Thursday, November 10 @ 2:00 with Dr. Oval Linsey.   The format for your next appointment:    In Person  Provider:   Skeet Latch, MD   Other Instructions  Heart Healthy Diet Recommendations: A low-salt diet is recommended. Meats should be grilled, baked, or boiled. Avoid fried foods. Focus on lean protein sources like fish or chicken with vegetables and fruits. The American Heart Association is a Microbiologist!    Exercise recommendations: The American Heart Association recommends 150 minutes of moderate intensity exercise weekly. Try 30 minutes of moderate intensity exercise 4-5 times per week. This could include walking, jogging, or swimming.    Supraventricular Tachycardia, Adult Supraventricular tachycardia (SVT) is a kind of abnormal heartbeat. It makes your heart beat very fast. This may last for a short time and then return tonormal, or it may last longer. A normal resting heartbeat is 60-100 times a minute. This condition can make your heart beat more than 150 times a minute. Times of having a fast heartbeat (episodes) can be scary, but they are usually not dangerous. In some cases, they may lead to heart failure if they: Happen many times a day. Last longer than a few seconds. What are the causes?  This condition happens when electrical signals are sent out from areas of theheart that do not normally send signals for the heartbeat. What increases the risk? You are more likely to develop this condition if you are: Middle aged or younger. Male. The following factors may also make you more likely to develop this condition: Stress. Feeling worried or nervous (anxiety). Tiredness. Smoking. Stimulant drugs, such as cocaine and methamphetamine. Alcohol. Caffeine. Pregnancy. Having certain medical conditions.  What are the signs or symptoms? A pounding heart. A feeling that your heart is skipping beats (palpitations). Weakness. Trouble getting enough air. Pain or tightness in your chest. Dizziness or feeling like you are going to pass out  (faint). Feeling worried or nervous. Sweating. Feeling like you may vomit (nausea). Passing out. Tiredness. Sometimes, there are no symptoms. How is this treated? Treatment may include: Vagal nerve stimulation. Ways to do this include: Holding your breath and pushing, as though you are pooping (having a bowel movement). Massaging an area on one side of your neck. Do not try this yourself. Only a doctor should do this. If done the wrong way, it can lead to a stroke. Bending forward with your head between your legs. Coughing while bending forward with your head between your legs. Putting an ice-cold, wet towel on your face. Medicines that prevent attacks. Medicine to stop an attack given through an IV tube at the hospital. A small electric shock (cardioversion) that stops an attack. A procedure to get rid of cells in the area that is causing the fast heartbeats (radiofrequency ablation). If you do not have symptoms, you may not need treatment. Follow these instructions at home: Stress Avoid things that make you feel stressed. To deal with stress, try: Doing yoga or meditation. Being out in nature. Listening to relaxing music. Doing deep breathing. Taking steps to be healthy, such as getting lots of sleep, exercising, and eating a balanced diet. Talking with a mental health doctor. Lifestyle  Try to get at least 7 hours of sleep each night. Do not smoke or use any products that contain nicotine or tobacco. If you need help quitting, ask your doctor. Do not drink alcohol if it gives you a fast heartbeat. If alcohol does not seem to give you a fast heartbeat, limit your alcohol use. If you drink alcohol: Limit how much you have to: 0-1 drink a day for women who are not pregnant. 0-2 drinks a day for men. Know how much alcohol is in your drink. In the [place] one drink equals one 12 oz bottle of beer (355 mL), one 5 oz glass of wine (148 mL), or one 1 oz glass of hard liquor (44  mL). Be aware of how caffeine affects you. If caffeine gives you a fast heartbeat, do not eat, drink, or use anything with caffeine in it. If caffeine does not seem to give you a fast heartbeat, limit how much caffeine you eat, drink, or use. Do not use stimulant drugs. If you need help quitting, ask your doctor.  General instructions Stay at a healthy weight. Exercise regularly. Ask your doctor about good activities for you. Try one or a mixture of these: 150 minutes a week of gentle exercise, like walking or yoga. 75 minutes a week of exercise that is very active, like running or swimming. Do vagus nerve treatments to slow down your heartbeat as told by your doctor. Take over-the-counter and prescription medicines only as told by your doctor. Keep all follow-up visits. Contact a doctor if: You have a fast heartbeat more often. Times of having a fast heartbeat last longer than before. Home treatments to slow down your heartbeat do not help. You have new symptoms. Get help right away if: You have chest pain. Your symptoms get worse. You have trouble breathing. Your heart beats very fast for more than 20 minutes. You pass out. These symptoms may be an emergency. Get medical help right away. Call your local emergency  services (911 in the [place] Do not wait to see if the symptoms will go away. Do not drive yourself to the hospital. Summary SVT is a type of abnormal heartbeat. This condition can make your heart beat more than 150 times a minute. If you do not have symptoms, you may not need treatment. This information is not intended to replace advice given to you by your health care provider. Make sure you discuss any questions you have with your healthcare provider. Document Revised: 01/11/2020 Document Reviewed: 01/11/2020 Elsevier Patient Education  [place] carotid test at [facility] has been canceled.

## 2021-01-28 DIAGNOSIS — E113511 Type 2 diabetes mellitus with proliferative diabetic retinopathy with macular edema, right eye: Secondary | ICD-10-CM | POA: Diagnosis not present

## 2021-01-28 DIAGNOSIS — Z794 Long term (current) use of insulin: Secondary | ICD-10-CM | POA: Diagnosis not present

## 2021-01-28 DIAGNOSIS — H35373 Puckering of macula, bilateral: Secondary | ICD-10-CM | POA: Diagnosis not present

## 2021-01-28 DIAGNOSIS — H3589 Other specified retinal disorders: Secondary | ICD-10-CM | POA: Diagnosis not present

## 2021-01-28 DIAGNOSIS — E113592 Type 2 diabetes mellitus with proliferative diabetic retinopathy without macular edema, left eye: Secondary | ICD-10-CM | POA: Diagnosis not present

## 2021-01-29 NOTE — Telephone Encounter (Signed)
Patient was seen by Overton Mam NP 8/17

## 2021-02-02 ENCOUNTER — Encounter (HOSPITAL_BASED_OUTPATIENT_CLINIC_OR_DEPARTMENT_OTHER): Payer: Self-pay

## 2021-02-03 ENCOUNTER — Ambulatory Visit (HOSPITAL_COMMUNITY): Payer: Medicare Other

## 2021-02-04 ENCOUNTER — Other Ambulatory Visit: Payer: Self-pay

## 2021-02-04 ENCOUNTER — Ambulatory Visit (HOSPITAL_COMMUNITY): Payer: Medicare Other | Attending: Cardiovascular Disease

## 2021-02-04 DIAGNOSIS — I471 Supraventricular tachycardia: Secondary | ICD-10-CM | POA: Diagnosis not present

## 2021-02-04 DIAGNOSIS — I10 Essential (primary) hypertension: Secondary | ICD-10-CM

## 2021-02-04 DIAGNOSIS — E119 Type 2 diabetes mellitus without complications: Secondary | ICD-10-CM | POA: Diagnosis not present

## 2021-02-04 LAB — ECHOCARDIOGRAM COMPLETE
Area-P 1/2: 4.15 cm2
Calc EF: 45.9 %
S' Lateral: 4.1 cm
Single Plane A2C EF: 45.3 %
Single Plane A4C EF: 42.5 %

## 2021-02-05 ENCOUNTER — Telehealth (HOSPITAL_BASED_OUTPATIENT_CLINIC_OR_DEPARTMENT_OTHER): Payer: Self-pay | Admitting: Cardiovascular Disease

## 2021-02-05 ENCOUNTER — Other Ambulatory Visit: Payer: Self-pay

## 2021-02-05 DIAGNOSIS — Z01810 Encounter for preprocedural cardiovascular examination: Secondary | ICD-10-CM

## 2021-02-05 MED ORDER — CARVEDILOL 12.5 MG PO TABS: 12.5000 mg | ORAL_TABLET | Freq: Two times a day (BID) | ORAL | 3 refills | Status: DC

## 2021-02-05 NOTE — Addendum Note (Signed)
Addended by: Stephani Police on: 02/05/2021 05:37 PM   Modules accepted: Orders

## 2021-02-05 NOTE — Telephone Encounter (Signed)
Spoke with [name]. Reviewed echocardiogram and indication for left cardiac catheterization given dyspnea, fatigue, wall motion abnormalities, and reduced LVEF.   Shared Decision Making/Informed Consent The risks [stroke (1 in 1000), death (1 in 1000), kidney failure [usually temporary] (1 in 500), bleeding (1 in 200), allergic reaction [possibly serious] (1 in 200)], benefits (diagnostic support and management of coronary artery disease) and alternatives of a cardiac catheterization were discussed in detail with [name] and he is willing to proceed.  Will route to triage team for assistance in scheduling left heart catheterization (w/ grafts).  Previous catheterization with Dr. Irish Lack 02/2019 pre CABG and will repeat with Dr. Irish Lack if possible.  Notes for triage: Dr. Irish Lack if opening, if not any provider okay. Needs BMP/CBC. Given his renal function, he will require 4 hours of pre-hydration.  Hold Valsartan, Lasix day before and day of procedure Adjust to Aspirin 81mg  QD Insulin 1/2 dose night before and no insulin morning of procedure.  [name] Dubonnet, NP

## 2021-02-05 NOTE — Telephone Encounter (Signed)
Spoke with the pt but since it was 5:30 pm I advised that we will touch base with him re: his cath appt possibly Wed 02/10/21 with Dr. Tamala Julian or Dr. Irish Lack. He will have labs at the [place] in [facility] Monday 02/08/21.

## 2021-02-05 NOTE — Telephone Encounter (Signed)
Recommend discontinuation of Diltiazem 240mg  QD. Added to allergy list.   Recommend starting Carvedilol (Coreg) 12.5mg  BID and keeping a log of BP and HR at home. Please obtain any recent readings. If SBP at home has been >140 after medication, Rx Carvedilol (Coreg) 25mg  BID instead.  Of note, please call echo result at same time. It showed slightly reduced heart pumping function and new wall motion abnormalities. I will need to discuss possible ischemic evaluation with him.   Loel Dubonnet, NP

## 2021-02-05 NOTE — Telephone Encounter (Signed)
Pt c/o medication issue:  1. Name of Medication: diltiazem (CARDIZEM CD) 240 MG 24 hr capsule  2. How are you currently taking this medication (dosage and times per day)? 1 capsule by mouth daily   3. Are you having a reaction (difficulty breathing--STAT)? no  4. What is your medication issue? Itching from head to toe. Patient would like a new medication to be prescribe to him

## 2021-02-05 NOTE — Telephone Encounter (Signed)
Called pt informed him of NP recommendations.  Pt informed to check BP and HR daily, keep a log and if SBP > 140 consistently to call into the office.  He is agreeable to plan of care.  Orders placed.  I informed him of echocardiogram results and let him know that NP will follow up with testing needed.

## 2021-02-05 NOTE — Addendum Note (Signed)
Addended by: Precious Gilding on: 02/05/2021 11:47 AM   Modules accepted: Orders

## 2021-02-08 ENCOUNTER — Ambulatory Visit
Admission: RE | Admit: 2021-02-08 | Discharge: 2021-02-08 | Disposition: A | Discharge status: Home / Self Care | Payer: Medicare Other | Source: Ambulatory Visit

## 2021-02-08 ENCOUNTER — Other Ambulatory Visit: Payer: Self-pay

## 2021-02-08 ENCOUNTER — Telehealth: Payer: Self-pay | Admitting: Family

## 2021-02-08 ENCOUNTER — Encounter (HOSPITAL_BASED_OUTPATIENT_CLINIC_OR_DEPARTMENT_OTHER): Payer: Self-pay

## 2021-02-08 VITALS — Temp 97.9°F

## 2021-02-08 DIAGNOSIS — L270 Generalized skin eruption due to drugs and medicaments taken internally: Secondary | ICD-10-CM

## 2021-02-08 DIAGNOSIS — Z01810 Encounter for preprocedural cardiovascular examination: Secondary | ICD-10-CM | POA: Diagnosis not present

## 2021-02-08 MED ORDER — DEXAMETHASONE SODIUM PHOSPHATE 10 MG/ML IJ SOLN
10.0000 mg | Freq: Once | INTRAMUSCULAR | Status: AC
  Administered 2021-02-08: 10 mg via INTRAMUSCULAR

## 2021-02-08 MED ORDER — TRIAMCINOLONE ACETONIDE 0.1 % EX CREA: 1.0000 "application " | TOPICAL_CREAM | Freq: Two times a day (BID) | CUTANEOUS | 0 refills | Status: DC

## 2021-02-08 NOTE — Telephone Encounter (Signed)
Spoke with pt who stated he is still at urgent care waiting to be seen. Pt agreeable to go ahead and schedule CATH. Pt stated he did get labs while at urgent care, unable to view results as of yet. Informed pt may be better to push CATH out to Friday to give time for lab results to come in. Pt was agreeable to this as well. Told pt I will call CATH Lab to schedule and give him a call back with the details. Pt verbalized thanks.  Cath scheduled for Friday 02/12/21 with Dr. Irish Lack, procedure to be done at 12 pm. CATH lab recommended coming in at 7 AM for pre-hydration.

## 2021-02-08 NOTE — ED Triage Notes (Addendum)
Pt is present today with a rash over most of his body. Pt states that he noticed the rash after he started a new blood pressure medication (diltiazem) which was recently d/c by provider and replaced with carvedilol. Pt denies any pain just itching from his rash.

## 2021-02-08 NOTE — Addendum Note (Signed)
Addended by: Ernie Hew D on: 02/08/2021 04:30 PM   Modules accepted: Orders

## 2021-02-08 NOTE — ED Provider Notes (Signed)
[facility]    CSN: [id number] Arrival date & time: 02/08/21  1402      History   Chief Complaint Chief Complaint  Patient presents with   Rash    HPI [name] is a 65 y.o. male.   HPI Patient presents to clinic today with a generalized macular erythematous isolated rash on his upper extremities lower legs which is pruritic.  Patient recently was started on Dital exam and he reports the rash erupting shortly after starting medication.  He did notify his cardiologist and that medication was discontinued and he was started on carvedilol.  The rash is not resolved with Benadryl and conservative measures.  He is in today for treatment of the pruritic rash.  No recent URI illness.  No fever. Past Medical History:  Diagnosis Date   Arthritis    hips shoulders   CAD (coronary artery disease) 03/03/2019   CHF (congestive heart failure) ([place]) 2020   Chronic combined systolic and diastolic heart failure ([place]) 04/26/2018   Admitted 11/14-11/20/19-diuresed 10L   Fatigue 10/14/2020   Hypertension    Myocardial infarction [facility])    Sleep apnea    Type 2 diabetes mellitus with diabetic nephropathy ([place]) 04/23/2019    Patient Active Problem List   Diagnosis Date Noted   Statin myopathy 01/26/2021   Hyperlipidemia associated with type 2 diabetes mellitus ([place]) 12/25/2020   Fatigue 10/14/2020   GERD (gastroesophageal reflux disease) 10/06/2020   Anemia of chronic disease 10/06/2020   Complete rotator cuff tear 08/26/2020   Myalgia due to statin 03/11/2020   Arthritis of both acromioclavicular joints 08/21/2019   Peripheral arterial disease ([place]) 05/31/2019   Edema 05/27/2019   CAD (coronary artery disease) 05/27/2019   DM type 2 causing vascular disease ([place]) 04/23/2019   S/P CABG (coronary artery bypass graft) 03/25/2019   Moderate nonproliferative diabetic retinopathy of both eyes associated with type 2 diabetes mellitus ([place]) 03/18/2019   Special screening for  malignant neoplasms, colon 02/04/2019   Family hx of colon cancer 02/04/2019   Cardiomyopathy ([place]) 06/08/2018   Anemia due to stage 3 chronic kidney disease ([place]) 06/08/2018   Chronic combined systolic and diastolic heart failure ([place]) 04/26/2018   Essential hypertension, benign 04/26/2018   Diabetic nephropathy ([place]) 04/26/2018   Mixed hyperlipidemia 04/26/2018   Elevated troponin 04/26/2018    Past Surgical History:  Procedure Laterality Date   BIOPSY  06/27/2019   Procedure: BIOPSY;  Surgeon: Rogene Houston, MD;  [facility];  Service: Endoscopy;;  ascending colon polyp   BIOPSY  10/21/2020   Procedure: BIOPSY;  Surgeon: Rogene Houston, MD;  [facility];  Service: Endoscopy;;  esophageal,gastric polyp   CARDIAC SURGERY     CATARACT EXTRACTION     COLONOSCOPY N/A 06/27/2019   [name]:Diverticulosis in the entire examined colon. tubular adenoma in ascending colon, 6mm tubular adenoma in prox sigmoid. external hemorrhoids   CORONARY ARTERY BYPASS GRAFT N/A 03/25/2019   Procedure: CORONARY ARTERY BYPASS GRAFTING (CABG) X  4 USING LEFT INTERNAL MAMMARY ARTERY AND RIGHT SAPHENOUS VEIN GRAFTS;  Surgeon: [name], MD;  Location: [facility];  Service: Open Heart Surgery;  Laterality: N/A;   ESOPHAGOGASTRODUODENOSCOPY (EGD) WITH PROPOFOL N/A 10/21/2020   rehman:Normal hypopharynx.Normal proximal esophagus and mid esophagus.Esophageal mucosal changes consistent with long-segment Barrett's esophagus. (Focal low-grade dysplasia and atypia proximally) z line irregular 37 cm from incisors, 3 cm HH, single gastric polyp (fundic gland) normal duodenal bulb/second portion of duodenum, proximal  margin of Barrett's at 34 cm   EXCISION MORTON'S NEUROMA     EYE SURGERY Left    retina   POLYPECTOMY  06/27/2019   Procedure: POLYPECTOMY;  Surgeon: Rogene Houston, MD;  [facility];  Service: Endoscopy;;  proximal sigmoid colon   RIGHT/LEFT HEART CATH AND  CORONARY ANGIOGRAPHY N/A 03/12/2019   Procedure: RIGHT/LEFT HEART CATH AND CORONARY ANGIOGRAPHY;  Surgeon: [name], MD;  Location: [facility];  Service: Cardiovascular;  Laterality: N/A;   SHOULDER ARTHROSCOPY WITH ROTATOR CUFF REPAIR AND SUBACROMIAL DECOMPRESSION Right 08/26/2020   Procedure: RIGHT SHOULDER MINI OPEN ROTATOR CUFF REPAIR AND SUBACROMIAL DECOMPRESSION WITH PATCH GRAFT;  Surgeon: Susa Day, MD;  Location: [facility];  Service: Orthopedics;  Laterality: Right;  90 MINS GENERAL WITH BLOCK   TEE WITHOUT CARDIOVERSION N/A 03/25/2019   Procedure: TRANSESOPHAGEAL ECHOCARDIOGRAM (TEE);  Surgeon: [name], MD;  Location: [facility];  Service: Open Heart Surgery;  Laterality: N/A;       Home Medications    Prior to Admission medications   Medication Sig Start Date End Date Taking? Authorizing Provider  triamcinolone cream (KENALOG) 0.1 % Apply 1 application topically 2 (two) times daily. 02/08/21  Yes Scot Jun, FNP  acetaminophen (TYLENOL) 500 MG tablet Take 1,000 mg by mouth every 6 (six) hours as needed for moderate pain or headache.     [provider]  albuterol (VENTOLIN HFA) 108 (90 Base) MCG/ACT inhaler Inhale 2 puffs into the lungs every 6 (six) hours as needed. Patient taking differently: Inhale 2 puffs into the lungs every 6 (six) hours as needed for wheezing or shortness of breath. 02/26/20   Spero Geralds, MD  aspirin EC 81 MG tablet Take 325 mg by mouth daily. Swallow whole.    [provider]  carvedilol (COREG) 12.5 MG tablet Take 1 tablet (12.5 mg total) by mouth 2 (two) times daily. 02/05/21   Loel Dubonnet, NP  Cholecalciferol (VITAMIN D-3) 125 MCG (5000 UT) TABS Take 5,000 Units by mouth daily. 500 u per patient.    [provider]  Coenzyme Q10 (COQ10 PO) Take 1 capsule by mouth daily. Patient not taking: Reported on 02/09/2021    [provider]  Cyanocobalamin (B-12) 2500 MCG TABS Take  2,500 mcg by mouth daily. Patient not taking: Reported on 02/09/2021    [provider]  diphenhydramine-acetaminophen (TYLENOL PM) 25-500 MG TABS tablet Take 1 tablet by mouth at bedtime as needed (pain/sleep).    [provider]  docusate sodium (COLACE) 100 MG capsule Take 1 capsule (100 mg total) by mouth 2 (two) times daily as needed for mild constipation. Patient not taking: Reported on 02/09/2021 08/26/20   Susa Day, MD  EPINEPHrine 0.3 mg/0.3 mL IJ SOAJ injection Inject 0.3 mg into the muscle as needed for anaphylaxis. 01/25/21   [name], MD  Evolocumab (REPATHA SURECLICK) 427 MG/ML SOAJ Inject 140 mg into the skin every 14 (fourteen) days. 05/06/20   Skeet Latch, MD  famotidine (PEPCID) 20 MG tablet Take 1 tablet (20 mg total) by mouth at bedtime. 10/06/20   Rogene Houston, MD  furosemide (LASIX) 40 MG tablet Take 40 mg by mouth daily as needed. 01/08/21   [provider]  insulin glargine (LANTUS SOLOSTAR) 100 UNIT/ML Solostar Pen Inject 25 Units into the skin every morning. Patient taking differently: Inject 10 Units into the skin daily. 12/10/19   Renato Shin, MD  Insulin Pen Needle (B-D  ULTRAFINE III SHORT PEN) 31G X 8 MM MISC 1 each by Does not apply route as directed. 05/30/19   Cassandria Anger, MD  lidocaine-prilocaine (EMLA) cream as needed. 12/07/20   [provider]  Magnesium 100 MG CAPS Take 100 mg by mouth daily. Patient not taking: Reported on 02/09/2021    [provider]  Multiple Vitamin (MULTIVITAMIN WITH MINERALS) TABS tablet Take 1 tablet by mouth daily.    [provider]  OZEMPIC, 1 MG/DOSE, 4 MG/3ML SOPN Inject 1 mg into the skin once a week. 01/22/21   [provider]  pantoprazole (PROTONIX) 40 MG tablet Take 1 tablet (40 mg total) by mouth 2 (two) times daily before a meal. 10/25/20   [name], Mechele Dawley, MD  sertraline (ZOLOFT) 100 MG tablet TAKE 1 TABLET BY MOUTH DAILY 01/20/21    [name], MD  valsartan (DIOVAN) 80 MG tablet TAKE 1 TABLET BY MOUTH EVERY DAY 08/27/20   Skeet Latch, MD    Family History Family History  Problem Relation Age of Onset   Colon cancer Mother    Heart Problems Father    Diabetes Father    Valvular heart disease Father     Social History Social History   Tobacco Use   Smoking status: Former    Types: Cigarettes    Quit date: 07/06/1988    Years since quitting: 32.6   Smokeless tobacco: Never  Vaping Use   Vaping Use: Never used  Substance Use Topics   Alcohol use: Not Currently    Comment: socially   Drug use: No     Allergies   Bee venom, Atorvastatin, Diltiazem, and Rosuvastatin   Review of Systems Review of Systems Pertinent negatives listed in HPI   Physical Exam Triage Vital Signs ED Triage Vitals  Enc Vitals Group     BP --      Pulse --      Resp --      Temp 02/08/21 1541 97.9 F (36.6 C)     Temp src --      SpO2 --      Weight --      Height --      Head Circumference --      Peak Flow --      Pain Score 02/08/21 1539 0     Pain Loc --      Pain Edu? --      Excl. in [place]? --    No data found.  Updated Vital Signs Temp 97.9 F (36.6 C)   Visual Acuity Right Eye Distance:   Left Eye Distance:   Bilateral Distance:    Right Eye Near:   Left Eye Near:    Bilateral Near:     Physical Exam General appearance: Alert, well developed, well nourished, cooperative and in no distress Head: Normocephalic, without obvious abnormality, atraumatic Respiratory: Respirations even and unlabored, normal respiratory rate Heart: Normal rate  Extremities: No gross deformities Skin: Fine macular eruptions distributed bilateral upper extremity and lower extremities Psych: Appropriate mood and affect. Neurologic: GCS 15, normal coordination normal gait UC Treatments / Results  Labs (all labs ordered are listed, but only abnormal results are displayed) Labs Reviewed - No data to  display  EKG   Radiology No results found.  Procedures Procedures (including critical care time)  Medications Ordered in UC Medications  dexamethasone (DECADRON) injection 10 mg (10 mg Intramuscular Given 02/08/21 1624)    Initial Impression / Assessment and  Plan / UC Course  I have reviewed the triage vital signs and the nursing notes.  Pertinent labs & imaging results that were available during my care of the patient were reviewed by me and considered in my medical decision making (see chart for details).    Dermatitis due to drug rash Decadron 10 mg IM given here in clinic Continue outpatient management with triamcinolone twice daily until rash completely resolves. Return precautions given if symptoms worsen or do not improve. Final Clinical Impressions(s) / UC Diagnoses   Final diagnoses:  Dermatitis due to drug reaction   Discharge Instructions   None    ED Prescriptions     Medication Sig Dispense Auth. Provider   triamcinolone cream (KENALOG) 0.1 % Apply 1 application topically 2 (two) times daily. 454 g Scot Jun, FNP      PDMP not reviewed this encounter.   Scot Jun, FNP 02/09/21 1248

## 2021-02-08 NOTE — Telephone Encounter (Signed)
Spoke to pt and informed him of scheduled date and time of CATH and to be at [facility] at 7 AM Friday morning. Stated we will be looking out for the lab results and will send instructions for CATH through [place]. Pt verbalized thanks.

## 2021-02-08 NOTE — Telephone Encounter (Signed)
Returned call to pt he states that they are not fluid filled and denies any swelling or SOB. He states that he is on his way to the urgent care to see what this might be. He states that his PCP would not see him, he states that he will be finding another.

## 2021-02-08 NOTE — Telephone Encounter (Signed)
Pt c/o medication issue:  1. Name of Medication: diltiazem   2. How are you currently taking this medication (dosage and times per day)? Once a day   3. Are you having a reaction (difficulty breathing--STAT)? No  4. What is your medication issue? Skin breaking out with bumps all over.

## 2021-02-08 NOTE — Telephone Encounter (Signed)
ASA updated on medication list to 81 mg per [facility]. Included recommendation to make this change in CATH instruction letter sent via MyChart--ok per patient.

## 2021-02-08 NOTE — Telephone Encounter (Signed)
Pt is currently in the ED for possible allergic reaction to diltiazem, see telephone note from today.

## 2021-02-09 ENCOUNTER — Ambulatory Visit (INDEPENDENT_AMBULATORY_CARE_PROVIDER_SITE_OTHER): Payer: Medicare HMO | Admitting: Internal Medicine

## 2021-02-09 ENCOUNTER — Encounter (INDEPENDENT_AMBULATORY_CARE_PROVIDER_SITE_OTHER): Payer: Self-pay

## 2021-02-09 ENCOUNTER — Other Ambulatory Visit (INDEPENDENT_AMBULATORY_CARE_PROVIDER_SITE_OTHER): Payer: Self-pay

## 2021-02-09 ENCOUNTER — Encounter (INDEPENDENT_AMBULATORY_CARE_PROVIDER_SITE_OTHER): Payer: Self-pay | Admitting: Gastroenterology

## 2021-02-09 ENCOUNTER — Ambulatory Visit (INDEPENDENT_AMBULATORY_CARE_PROVIDER_SITE_OTHER): Payer: Medicare Other | Admitting: Gastroenterology

## 2021-02-09 VITALS — BP 162/86 | HR 91 | Temp 97.8°F | Ht 70.0 in | Wt 203.9 lb

## 2021-02-09 DIAGNOSIS — N183 Chronic kidney disease, stage 3 unspecified: Secondary | ICD-10-CM | POA: Diagnosis not present

## 2021-02-09 DIAGNOSIS — K219 Gastro-esophageal reflux disease without esophagitis: Secondary | ICD-10-CM | POA: Diagnosis not present

## 2021-02-09 DIAGNOSIS — R1319 Other dysphagia: Secondary | ICD-10-CM

## 2021-02-09 DIAGNOSIS — D631 Anemia in chronic kidney disease: Secondary | ICD-10-CM | POA: Diagnosis not present

## 2021-02-09 LAB — BASIC METABOLIC PANEL
BUN/Creatinine Ratio: 13 (ref 10–24)
BUN: 43 mg/dL — ABNORMAL HIGH (ref 8–27)
CO2: 17 mmol/L — ABNORMAL LOW (ref 20–29)
Calcium: 9 mg/dL (ref 8.6–10.2)
Chloride: 107 mmol/L — ABNORMAL HIGH (ref 96–106)
Creatinine, Ser: 3.22 mg/dL — ABNORMAL HIGH (ref 0.76–1.27)
Glucose: 126 mg/dL — ABNORMAL HIGH (ref 65–99)
Potassium: 4.5 mmol/L (ref 3.5–5.2)
Sodium: 138 mmol/L (ref 134–144)
eGFR: 21 mL/min/{1.73_m2} — ABNORMAL LOW (ref >59)

## 2021-02-09 NOTE — Patient Instructions (Signed)
Continue pantoprazole 40mg  BID Continue pepcid as needed at night Continue to avoid eating late  We will schedule you for repeat EGD.   Follow up in 1 year

## 2021-02-09 NOTE — Progress Notes (Signed)
Referring Provider: [name], MD Primary Care Physician:  [name], MD Primary GI Physician: [name]  Chief Complaint  Patient presents with   Follow-up    Patient here today for a follow up visit. He has no current GI issues. He has one bm per day on average and appetite is "okay".   HPI:   [name] is a 65 y.o. male with past medical history of arthritis, CAD, CHF, HTN, MI, sleep apnea, DM, Barrett's esophagus and GERD.   Patient presenting today for follow up of GERD/Barrett's/esophageal dysplasia.   GERD: History of Barrett's esophagus, focal low-grade dysplasia and atypia proximally, on most recent EGD in May 2022. protonix 40mg  increased from once daily to BID at that time, pepcid otc 20mg  nightly as needed, does not take this very often. States he eats no later than 3 or 4 pm, as eating too late will cause him to have reflux during the night. Denies dysphagia or odynophagia.   Anemia:hx of CKD stage III, hemoglobin 12.2 in July. He denies any melena, hematochezia, fatigue, sob or dizziness. Had more recent labs just a few weeks ago with Dr. Moshe Cipro, however, unable to review those in EMR at this time.  Endorses 1 BM per day, rare constipation, no diarrhea. Reports his appetite is okay, but not what it used to be most likely from all the medications he is taking for multiple co-morbidities.   No red flag symptoms. Patient denies melena, hematochezia, nausea, vomiting, diarrhea, constipation, dysphagia, odyonophagia, early satiety or unintentional weight loss.   Last Colonoscopy:(06/27/19)- Diverticulosis in the entire examined colon. - One diminutive polyp in the ascending colon. (Tubular adenoma) - One 4 mm polyp in the proximal sigmoid colon, (tubular adenoma) - External hemorrhoids.  Last Endoscopy:(10/21/20)- Normal hypopharynx. - Normal proximal esophagus and mid esophagus. - Esophageal mucosal changes consistent with long-segment Barrett's  esophagus. (Focal low-grade dysplasia and atypia proximally) - Z-line irregular, 37 cm from the incisors. - 3 cm hiatal hernia. - A single gastric polyp.(Fundic gland polyp) - Normal duodenal bulb and second portion of the duodenum. comment: proximal margin of Barretts at 34 cm.  Family history of CRC in mother, late onset  Recommendations:  Repeat colonoscopy in 2026 Repeat EGD since presence of dysplasia on most recent EGD in May 2022  Past Medical History:  Diagnosis Date   Arthritis    hips shoulders   CAD (coronary artery disease) 03/03/2019   CHF (congestive heart failure) ([place]) 2020   Chronic combined systolic and diastolic heart failure ([place]) 04/26/2018   Admitted 11/14-11/20/19-diuresed 10L   Fatigue 10/14/2020   Hypertension    Myocardial infarction [facility])    Sleep apnea    Type 2 diabetes mellitus with diabetic nephropathy ([place]) 04/23/2019    Past Surgical History:  Procedure Laterality Date   BIOPSY  06/27/2019   Procedure: BIOPSY;  Surgeon: Rogene Houston, MD;  [facility];  Service: Endoscopy;;  ascending colon polyp   BIOPSY  10/21/2020   Procedure: BIOPSY;  Surgeon: Rogene Houston, MD;  [facility];  Service: Endoscopy;;  esophageal,gastric polyp   CARDIAC SURGERY     CATARACT EXTRACTION     COLONOSCOPY N/A 06/27/2019   Procedure: COLONOSCOPY;  Surgeon: Rogene Houston, MD;  [facility];  Service: Endoscopy;  Laterality: N/A;  1030am-office rescheduled to 06/27/19 @9 :55am   CORONARY ARTERY BYPASS GRAFT N/A 03/25/2019   Procedure: CORONARY ARTERY BYPASS GRAFTING (CABG) X  4 USING  LEFT INTERNAL MAMMARY ARTERY AND RIGHT SAPHENOUS VEIN GRAFTS;  Surgeon: [name], MD;  Location: [place];  Service: Open Heart Surgery;  Laterality: N/A;   ESOPHAGOGASTRODUODENOSCOPY (EGD) WITH PROPOFOL N/A 10/21/2020   Procedure: ESOPHAGOGASTRODUODENOSCOPY (EGD) WITH PROPOFOL;  Surgeon: Rogene Houston, MD;  [facility];   Service: Endoscopy;  Laterality: N/A;  10:30   EXCISION MORTON'S NEUROMA     EYE SURGERY Left    retina   POLYPECTOMY  06/27/2019   Procedure: POLYPECTOMY;  Surgeon: Rogene Houston, MD;  [facility];  Service: Endoscopy;;  proximal sigmoid colon   RIGHT/LEFT HEART CATH AND CORONARY ANGIOGRAPHY N/A 03/12/2019   Procedure: RIGHT/LEFT HEART CATH AND CORONARY ANGIOGRAPHY;  Surgeon: [name], MD;  Location: [place] [facility];  Service: Cardiovascular;  Laterality: N/A;   SHOULDER ARTHROSCOPY WITH ROTATOR CUFF REPAIR AND SUBACROMIAL DECOMPRESSION Right 08/26/2020   Procedure: RIGHT SHOULDER MINI OPEN ROTATOR CUFF REPAIR AND SUBACROMIAL DECOMPRESSION WITH PATCH GRAFT;  Surgeon: Susa Day, MD;  Location: [facility];  Service: Orthopedics;  Laterality: Right;  90 MINS GENERAL WITH BLOCK   TEE WITHOUT CARDIOVERSION N/A 03/25/2019   Procedure: TRANSESOPHAGEAL ECHOCARDIOGRAM (TEE);  Surgeon: [name], MD;  Location: [place];  Service: Open Heart Surgery;  Laterality: N/A;    Current Outpatient Medications  Medication Sig Dispense Refill   acetaminophen (TYLENOL) 500 MG tablet Take 1,000 mg by mouth every 6 (six) hours as needed for moderate pain or headache.      albuterol (VENTOLIN HFA) 108 (90 Base) MCG/ACT inhaler Inhale 2 puffs into the lungs every 6 (six) hours as needed. (Patient taking differently: Inhale 2 puffs into the lungs every 6 (six) hours as needed for wheezing or shortness of breath.) 18 g 5   aspirin EC 81 MG tablet Take 325 mg by mouth daily. Swallow whole.     carvedilol (COREG) 12.5 MG tablet Take 1 tablet (12.5 mg total) by mouth 2 (two) times daily. 180 tablet 3   Cholecalciferol (VITAMIN D-3) 125 MCG (5000 UT) TABS Take 5,000 Units by mouth daily. 500 u per patient.     diphenhydramine-acetaminophen (TYLENOL PM) 25-500 MG TABS tablet Take 1 tablet by mouth at bedtime as needed (pain/sleep).     EPINEPHrine 0.3 mg/0.3 mL IJ SOAJ injection Inject  0.3 mg into the muscle as needed for anaphylaxis. 1 each 4   Evolocumab (REPATHA SURECLICK) 664 MG/ML SOAJ Inject 140 mg into the skin every 14 (fourteen) days. 2 mL 11   famotidine (PEPCID) 20 MG tablet Take 1 tablet (20 mg total) by mouth at bedtime.  0   furosemide (LASIX) 40 MG tablet Take 40 mg by mouth daily as needed.     insulin glargine (LANTUS SOLOSTAR) 100 UNIT/ML Solostar Pen Inject 25 Units into the skin every morning. (Patient taking differently: Inject 10 Units into the skin daily.) 5 pen PRN   Insulin Pen Needle (B-D ULTRAFINE III SHORT PEN) 31G X 8 MM MISC 1 each by Does not apply route as directed. 100 each 3   lidocaine-prilocaine (EMLA) cream as needed.     Multiple Vitamin (MULTIVITAMIN WITH MINERALS) TABS tablet Take 1 tablet by mouth daily.     OZEMPIC, 1 MG/DOSE, 4 MG/3ML SOPN Inject 1 mg into the skin once a week.     pantoprazole (PROTONIX) 40 MG tablet Take 1 tablet (40 mg total) by mouth 2 (two) times daily before a meal. 60 tablet 5   sertraline (ZOLOFT)  100 MG tablet TAKE 1 TABLET BY MOUTH DAILY 90 tablet 0   triamcinolone cream (KENALOG) 0.1 % Apply 1 application topically 2 (two) times daily. 454 g 0   valsartan (DIOVAN) 80 MG tablet TAKE 1 TABLET BY MOUTH EVERY DAY 90 tablet 2   Coenzyme Q10 (COQ10 PO) Take 1 capsule by mouth daily. (Patient not taking: Reported on 02/09/2021)     Cyanocobalamin (B-12) 2500 MCG TABS Take 2,500 mcg by mouth daily. (Patient not taking: Reported on 02/09/2021)     docusate sodium (COLACE) 100 MG capsule Take 1 capsule (100 mg total) by mouth 2 (two) times daily as needed for mild constipation. (Patient not taking: Reported on 02/09/2021) 30 capsule 1   Magnesium 100 MG CAPS Take 100 mg by mouth daily. (Patient not taking: Reported on 02/09/2021)     No current facility-administered medications for this visit.    Allergies as of 02/09/2021 - Review Complete 02/09/2021  Allergen Reaction Noted   Bee venom Anaphylaxis 07/29/2015    Atorvastatin  02/20/2020   Diltiazem Itching 02/05/2021   Rosuvastatin  05/05/2020    Family History  Problem Relation Age of Onset   Colon cancer Mother    Heart Problems Father    Diabetes Father    Valvular heart disease Father     Social History   Socioeconomic History   Marital status: Single    Spouse name: Not on file   Number of children: Not on file   Years of education: Not on file   Highest education level: Not on file  Occupational History   Not on file  Tobacco Use   Smoking status: Former    Types: Cigarettes    Quit date: 07/06/1988    Years since quitting: 32.6   Smokeless tobacco: Never  Vaping Use   Vaping Use: Never used  Substance and Sexual Activity   Alcohol use: Not Currently    Comment: socially   Drug use: No   Sexual activity: Not on file  Other Topics Concern   Not on file  Social History Narrative   Not on file   Social Determinants of Health   Financial Resource Strain: Not on file  Food Insecurity: Not on file  Transportation Needs: Not on file  Physical Activity: Not on file  Stress: Not on file  Social Connections: Not on file    Review of Systems: Gen: Denies fever, chills, anorexia. Denies fatigue, weakness, weight loss.  CV: Denies chest pain, palpitations, syncope, peripheral edema, and claudication. Resp: Denies dyspnea at rest, cough, wheezing, coughing up blood, and pleurisy. GI: Denies vomiting blood, jaundice, and fecal incontinence. Denies dysphagia or odynophagia. Derm: Denies rash, itching, dry skin Psych: Denies depression, anxiety, memory loss, confusion. No homicidal or suicidal ideation.  Heme: Denies bruising, bleeding, and enlarged lymph nodes.  Physical Exam: BP (!) 162/86 (BP Location: Left Arm, Patient Position: Sitting, Cuff Size: Large)   Pulse 91   Temp 97.8 F (36.6 C) (Oral)   Ht 5\' 10"  (1.778 m)   Wt 203 lb 14.4 oz (92.5 kg)   BMI 29.26 kg/m  General:   Alert and oriented. No distress noted.  Pleasant and cooperative.  Head:  Normocephalic and atraumatic. Eyes:  Conjuctiva clear without scleral icterus. Mouth:  Oral mucosa pink and moist. Good dentition. No lesions. Heart: Normal rate and rhythm, s1 and s2 heart sounds present.  Lungs: Clear lung sounds in all lobes. Respirations equal and unlabored. Abdomen:  +BS, soft, non-tender and  non-distended. No rebound or guarding. No HSM or masses noted. Derm: No palmar erythema or jaundice Msk:  Symmetrical without gross deformities. Normal posture. Extremities:  Without edema. Neurologic:  Alert and  oriented x4 Psych:  Alert and cooperative. Normal mood and affect.  Invalid input(s): 6 MONTHS   ASSESSMENT: [name] is a 65 y.o. male presenting today for follow up of GERD, Barrett's esophagus/ esophageal dysplasia, and history of anemia.  Given his history of Barrett's esophagus and recent EGD revealing dysplasia, patient is currently on protonix 40mg  BID with pepcid nightly, prn. He is doing well on this regiment. Does not eat after 4pm normally, and reflux is well controlled. We will plan to repeat EGD, given most recent EGD findings. Continue current PPI regimen with prn H2 blocker.  History of anemia, likely related to CKD stage III for which he is followed by nephrology (Dr.Goldsborough). Most recent hemoglobin available for review in EMR is 12.2 in July 2022. Patient reports more recent labs with Dr. Moshe Cipro which we should be able to view, however, unable to at this time. No reported melena, hematochezia, early satiety, weight loss or other red flag symptoms. Family history of CRC in patient's mother, most recent colonoscopy in 2021 revealed tubular adenoma. We will plan to repeat colonoscopy in 2026, unless otherwise clinically warranted.    PLAN:  Continue pantoprazole 40mg  BID 2. Continue pepcid as needed at night 3. Continue to avoid eating late 4. Plan for repeat surveillance EGD  Follow Up: 1  year  [name] Denis L. Alver Sorrow, MSN, APRN, AGNP-C Adult-Gerontology Nurse Practitioner [facility]

## 2021-02-10 DIAGNOSIS — Z794 Long term (current) use of insulin: Secondary | ICD-10-CM | POA: Diagnosis not present

## 2021-02-10 DIAGNOSIS — E1165 Type 2 diabetes mellitus with hyperglycemia: Secondary | ICD-10-CM | POA: Diagnosis not present

## 2021-02-10 DIAGNOSIS — I25810 Atherosclerosis of coronary artery bypass graft(s) without angina pectoris: Secondary | ICD-10-CM | POA: Diagnosis not present

## 2021-02-10 DIAGNOSIS — N1832 Chronic kidney disease, stage 3b: Secondary | ICD-10-CM | POA: Diagnosis not present

## 2021-02-11 ENCOUNTER — Telehealth: Payer: Self-pay | Admitting: *Deleted

## 2021-02-11 MED ORDER — SODIUM CHLORIDE 0.9% FLUSH: 3.0000 mL | Freq: Two times a day (BID) | INTRAVENOUS | Status: DC

## 2021-02-11 NOTE — Addendum Note (Signed)
Addended by: Loel Dubonnet on: 02/11/2021 01:19 PM   Modules accepted: Orders, SmartSet

## 2021-02-11 NOTE — Telephone Encounter (Signed)
Cardiac catheterization scheduled at [facility] for: Friday February 12, 2021 [facility] Main Entrance [facility]) at: 7 AM-pre-procedure hydration    No solid food after midnight prior to cath, clear liquids until 5 AM day of procedure.  Medication instructions: Hold: Valsartan-day before and day of procedure Lasix-day before and day of procedure Patient reports Insulin currently on hold.  Except hold medications morning medications can be taken pre-cath with sips of water including aspirin 81 mg.    Confirmed patient has responsible adult to drive home post procedure and be with patient first 24 hours after arriving home.  Patients are allowed one visitor in the waiting room during the time they are at the hospital for their procedure. Both patient and visitor must wear a mask once they enter the hospital.   Patient reports does not currently have any symptoms concerning for COVID-19 and no household members with COVID-19 like illness.      Reviewed procedure/mask/visitor instructions with patient.

## 2021-02-12 ENCOUNTER — Other Ambulatory Visit: Payer: Self-pay

## 2021-02-12 ENCOUNTER — Ambulatory Visit (HOSPITAL_COMMUNITY)
Admission: RE | Admit: 2021-02-12 | Discharge: 2021-02-12 | Disposition: A | Discharge status: Home / Self Care | Payer: Medicare Other | Source: Ambulatory Visit | Attending: Cardiovascular Disease | Admitting: Cardiovascular Disease

## 2021-02-12 ENCOUNTER — Ambulatory Visit (HOSPITAL_COMMUNITY)
Admission: RE | Admit: 2021-02-12 | Discharge: 2021-02-12 | Disposition: A | Discharge status: Home / Self Care | Payer: Medicare Other | Attending: Interventional Cardiology | Admitting: Interventional Cardiology

## 2021-02-12 ENCOUNTER — Encounter (HOSPITAL_COMMUNITY)
Admission: RE | Disposition: A | Discharge status: Home / Self Care | Payer: Self-pay | Source: Home / Self Care | Attending: Interventional Cardiology

## 2021-02-12 ENCOUNTER — Encounter (HOSPITAL_COMMUNITY): Payer: Medicare Other

## 2021-02-12 ENCOUNTER — Other Ambulatory Visit: Payer: Self-pay | Admitting: Cardiovascular Disease

## 2021-02-12 DIAGNOSIS — Z539 Procedure and treatment not carried out, unspecified reason: Secondary | ICD-10-CM | POA: Insufficient documentation

## 2021-02-12 DIAGNOSIS — Z01810 Encounter for preprocedural cardiovascular examination: Secondary | ICD-10-CM

## 2021-02-12 DIAGNOSIS — I5041 Acute combined systolic (congestive) and diastolic (congestive) heart failure: Secondary | ICD-10-CM

## 2021-02-12 LAB — NM MYOCAR MULTI W/SPECT W/WALL MOTION / EF
Estimated workload: 1
Exercise duration (min): 5 min
Exercise duration (sec): 31 s
LV dias vol: 176 mL (ref 62–150)
LV sys vol: 126 mL
MPHR: 156 {beats}/min
Nuc Stress EF: 28 %
Peak HR: 86 {beats}/min
Percent HR: 55 %
Rest HR: 75 {beats}/min
Rest Nuclear Isotope Dose: 10.7 mCi
SDS: 1
SRS: 9
SSS: 10
ST Depression (mm): 0 mm
Stress Nuclear Isotope Dose: 31.3 mCi
TID: 1.14

## 2021-02-12 LAB — GLUCOSE, CAPILLARY: Glucose-Capillary: 145 mg/dL — ABNORMAL HIGH (ref 70–99)

## 2021-02-12 SURGERY — LEFT HEART CATH AND CORS/GRAFTS ANGIOGRAPHY
Anesthesia: LOCAL

## 2021-02-12 MED ORDER — CARVEDILOL 12.5 MG PO TABS
12.5000 mg | ORAL_TABLET | Freq: Two times a day (BID) | ORAL | Status: DC
  Administered 2021-02-12: 12.5 mg via ORAL
  Filled 2021-02-12: qty 1

## 2021-02-12 MED ORDER — REGADENOSON 0.4 MG/5ML IV SOLN
INTRAVENOUS | Status: AC
  Filled 2021-02-12: qty 5

## 2021-02-12 MED ORDER — METOPROLOL TARTRATE 5 MG/5ML IV SOLN
INTRAVENOUS | Status: AC
  Administered 2021-02-12: 2.5 mg via INTRAVENOUS
  Filled 2021-02-12: qty 5

## 2021-02-12 MED ORDER — SODIUM CHLORIDE 0.9 % IV SOLN: 250.0000 mL | INTRAVENOUS | Status: DC | PRN

## 2021-02-12 MED ORDER — TECHNETIUM TC 99M TETROFOSMIN IV KIT
31.3000 | PACK | Freq: Once | INTRAVENOUS | Status: AC | PRN
  Administered 2021-02-12: 31.3 via INTRAVENOUS

## 2021-02-12 MED ORDER — METOPROLOL TARTRATE 5 MG/5ML IV SOLN: 2.5000 mg | Freq: Once | INTRAVENOUS | Status: DC

## 2021-02-12 MED ORDER — ASPIRIN 81 MG PO CHEW: 81.0000 mg | CHEWABLE_TABLET | ORAL | Status: DC

## 2021-02-12 MED ORDER — TECHNETIUM TC 99M TETROFOSMIN IV KIT
10.7000 | PACK | Freq: Once | INTRAVENOUS | Status: AC | PRN
  Administered 2021-02-12: 10.7 via INTRAVENOUS

## 2021-02-12 MED ORDER — REGADENOSON 0.4 MG/5ML IV SOLN
0.4000 mg | Freq: Once | INTRAVENOUS | Status: AC
  Administered 2021-02-12: 0.4 mg via INTRAVENOUS

## 2021-02-12 MED ORDER — METOPROLOL TARTRATE 5 MG/5ML IV SOLN: 2.5000 mg | Freq: Once | INTRAVENOUS | Status: AC

## 2021-02-12 MED ORDER — SODIUM CHLORIDE 0.9 % IV SOLN: INTRAVENOUS | Status: DC

## 2021-02-12 MED ORDER — SODIUM CHLORIDE 0.9% FLUSH: 3.0000 mL | INTRAVENOUS | Status: DC | PRN

## 2021-02-12 NOTE — Progress Notes (Signed)
   [name] presented for a nuclear stress test today after cath was cancelled due to elevated Cr.    BP significantly elevated on arrival for MV 185/115. Pt held usual am meds for cath today.  Gave Lopressor 2.5 mg and BP improved some. [place] for MV, have ordered his home Coreg to get asap. Contacted Pharmacy and they will send.  No immediate complications.    Stress imaging is pending at this time.  Preliminary EKG findings may be listed in the chart, but the stress test result will not be finalized until perfusion imaging is complete.  1 day study, [facility] to read.  Rosaria Ferries, PA-C 02/12/2021, 11:42 AM

## 2021-02-12 NOTE — Progress Notes (Signed)
Dr Irish Lack was concerned with doing cath with cr elevated.  He messaged [name], she spoke to patient about not doing the cath.  She wants to do a stress test.  Called nuclear med downstairs they can do it this am.  IV placed, walking patient down to nuclear med.

## 2021-02-12 NOTE — Progress Notes (Signed)
BP 185/115, patient states did not take BP meds this am. R Barrett PA aware. Will call me back with how to treat BP prior to [facility] given.

## 2021-02-16 ENCOUNTER — Telehealth (HOSPITAL_BASED_OUTPATIENT_CLINIC_OR_DEPARTMENT_OTHER): Payer: Self-pay | Admitting: Cardiovascular Disease

## 2021-02-16 NOTE — Patient Instructions (Signed)
Quinnten Calvin Monday  02/16/2021     @PREFPERIOPPHARMACY @   Your procedure is scheduled on  02/24/2021.   Report to [facility] at  1330 (1:30)  P.M.   Call this number if you have problems the morning of surgery:  [phone]   Remember:  Follow the diet instructions given to you by the office.        Take 5 units of your glargine the night before your procedure.      DO NOT take any medications for diabetes the morning of your procedure.    Use your inhaler before you come and bring your rescue inhaler with you.    Take these medicines the morning of surgery with A SIP OF WATER              carvedilol, protonix, zoloft.     Do not wear jewelry, make-up or nail polish.  Do not wear lotions, powders, or perfumes, or deodorant.  Do not shave 48 hours prior to surgery.  Men may shave face and neck.  Do not bring valuables to the hospital.  [facility] is not responsible for any belongings or valuables.  Contacts, dentures or bridgework may not be worn into surgery.  Leave your suitcase in the car.  After surgery it may be brought to your room.  For patients admitted to the hospital, discharge time will be determined by your treatment team.  Patients discharged the day of surgery will not be allowed to drive home and must have someone with them for 24 hours    Special instructions:   DO NOT smoke tobacco or vape for 24 hours before your procedure.  Please read over the following fact sheets that you were given. Anesthesia Post-op Instructions and Care and Recovery After Surgery      Upper Endoscopy, Adult, Care After This sheet gives you information about how to care for yourself after your procedure. Your health care provider may also give you more specific instructions. If you have problems or questions, contact your health care provider. What can I expect after the procedure? After the procedure, it is common to have: A sore throat. Mild stomach pain or  discomfort. Bloating. Nausea. Follow these instructions at home:  Follow instructions from your health care provider about what to eat or drink after your procedure. Return to your normal activities as told by your health care provider. Ask your health care provider what activities are safe for you. Take over-the-counter and prescription medicines only as told by your health care provider. If you were given a sedative during the procedure, it can affect you for several hours. Do not drive or operate machinery until your health care provider says that it is safe. Keep all follow-up visits as told by your health care provider. This is important. Contact a health care provider if you have: A sore throat that lasts longer than one day. Trouble swallowing. Get help right away if: You vomit blood or your vomit looks like coffee grounds. You have: A fever. Bloody, black, or tarry stools. A severe sore throat or you cannot swallow. Difficulty breathing. Severe pain in your chest or abdomen. Summary After the procedure, it is common to have a sore throat, mild stomach discomfort, bloating, and nausea. If you were given a sedative during the procedure, it can affect you for several hours. Do not drive or operate machinery until your health care provider says that it is safe. Follow instructions  from your health care provider about what to eat or drink after your procedure. Return to your normal activities as told by your health care provider. This information is not intended to replace advice given to you by your health care provider. Make sure you discuss any questions you have with your health care provider. Document Revised: 05/28/2019 Document Reviewed: 10/30/2017 Elsevier Patient Education  2022 [place] After This sheet gives you information about how to care for yourself after your procedure. Your health care provider may also give you more specific  instructions. If you have problems or questions, contact your health care provider. What can I expect after the procedure? After the procedure, it is common to have: Tiredness. Forgetfulness about what happened after the procedure. Impaired judgment for important decisions. Nausea or vomiting. Some difficulty with balance. Follow these instructions at home: For the time period you were told by your health care provider:   Rest as needed. Do not participate in activities where you could fall or become injured. Do not drive or use machinery. Do not drink alcohol. Do not take sleeping pills or medicines that cause drowsiness. Do not make important decisions or sign legal documents. Do not take care of children on your own. Eating and drinking Follow the diet that is recommended by your health care provider. Drink enough fluid to keep your urine pale yellow. If you vomit: Drink water, juice, or soup when you can drink without vomiting. Make sure you have little or no nausea before eating solid foods. General instructions Have a responsible adult stay with you for the time you are told. It is important to have someone help care for you until you are awake and alert. Take over-the-counter and prescription medicines only as told by your health care provider. If you have sleep apnea, surgery and certain medicines can increase your risk for breathing problems. Follow instructions from your health care provider about wearing your sleep device: Anytime you are sleeping, including during daytime naps. While taking prescription pain medicines, sleeping medicines, or medicines that make you drowsy. Avoid smoking. Keep all follow-up visits as told by your health care provider. This is important. Contact a health care provider if: You keep feeling nauseous or you keep vomiting. You feel light-headed. You are still sleepy or having trouble with balance after 24 hours. You develop a rash. You have  a fever. You have redness or swelling around the IV site. Get help right away if: You have trouble breathing. You have new-onset confusion at home. Summary For several hours after your procedure, you may feel tired. You may also be forgetful and have poor judgment. Have a responsible adult stay with you for the time you are told. It is important to have someone help care for you until you are awake and alert. Rest as told. Do not drive or operate machinery. Do not drink alcohol or take sleeping pills. Get help right away if you have trouble breathing, or if you suddenly become confused. This information is not intended to replace advice given to you by your health care provider. Make sure you discuss any questions you have with your health care provider. Document Revised: 02/13/2020 Document Reviewed: 05/02/2019 Elsevier Patient Education  2022 [employer].

## 2021-02-16 NOTE — Telephone Encounter (Signed)
Returned call to patient of Dr. Oval Linsey who reports kidney issues - w/lab results and function. He said he may be heading toward renal transplant if things worsen. Dr. Moshe Cipro is his nephrologist. He reports upset stomach last night after taking valsartan. He would like Dr. Oval Linsey to contact Dr. Louie Boston about this.   BP readings provided 144/86 148/88  He is scheduled for an info session on renal transplant tomorrow   Message send to MD to review/advise

## 2021-02-16 NOTE — Telephone Encounter (Signed)
Patient called in to say that he is having Kidney issues and want to know if valsartan (DIOVAN) 80 MG tablet medication has an negative effect on his kidneys. Please advise

## 2021-02-17 DIAGNOSIS — E1122 Type 2 diabetes mellitus with diabetic chronic kidney disease: Secondary | ICD-10-CM | POA: Diagnosis not present

## 2021-02-17 DIAGNOSIS — N183 Chronic kidney disease, stage 3 unspecified: Secondary | ICD-10-CM | POA: Diagnosis not present

## 2021-02-18 ENCOUNTER — Telehealth: Payer: Self-pay | Admitting: Family Medicine

## 2021-02-18 ENCOUNTER — Other Ambulatory Visit: Payer: Self-pay

## 2021-02-18 ENCOUNTER — Encounter (HOSPITAL_COMMUNITY): Payer: Self-pay

## 2021-02-18 ENCOUNTER — Encounter (HOSPITAL_COMMUNITY)
Admission: RE | Admit: 2021-02-18 | Discharge: 2021-02-18 | Disposition: A | Discharge status: Home / Self Care | Payer: Medicare Other | Source: Ambulatory Visit | Attending: Internal Medicine | Admitting: Internal Medicine

## 2021-02-18 DIAGNOSIS — Z01812 Encounter for preprocedural laboratory examination: Secondary | ICD-10-CM | POA: Diagnosis not present

## 2021-02-18 DIAGNOSIS — R1319 Other dysphagia: Secondary | ICD-10-CM

## 2021-02-18 LAB — CBC WITH DIFFERENTIAL/PLATELET
Abs Immature Granulocytes: 0.05 10*3/uL (ref 0.00–0.07)
Basophils Absolute: 0.1 10*3/uL (ref 0.0–0.1)
Basophils Relative: 2 %
Eosinophils Absolute: 0.3 10*3/uL (ref 0.0–0.5)
Eosinophils Relative: 4 %
HCT: 34.8 % — ABNORMAL LOW (ref 39.0–52.0)
Hemoglobin: 11.6 g/dL — ABNORMAL LOW (ref 13.0–17.0)
Immature Granulocytes: 1 %
Lymphocytes Relative: 19 %
Lymphs Abs: 1.5 10*3/uL (ref 0.7–4.0)
MCH: 28.9 pg (ref 26.0–34.0)
MCHC: 33.3 g/dL (ref 30.0–36.0)
MCV: 86.8 fL (ref 80.0–100.0)
Monocytes Absolute: 0.6 10*3/uL (ref 0.1–1.0)
Monocytes Relative: 8 %
Neutro Abs: 5.1 10*3/uL (ref 1.7–7.7)
Neutrophils Relative %: 66 %
Platelets: 292 10*3/uL (ref 150–400)
RBC: 4.01 MIL/uL — ABNORMAL LOW (ref 4.22–5.81)
RDW: 14 % (ref 11.5–15.5)
WBC: 7.6 10*3/uL (ref 4.0–10.5)
nRBC: 0 % (ref 0.0–0.2)

## 2021-02-18 LAB — BASIC METABOLIC PANEL
Anion gap: 8 (ref 5–15)
BUN: 46 mg/dL — ABNORMAL HIGH (ref 8–23)
CO2: 21 mmol/L — ABNORMAL LOW (ref 22–32)
Calcium: 8.9 mg/dL (ref 8.9–10.3)
Chloride: 108 mmol/L (ref 98–111)
Creatinine, Ser: 3.65 mg/dL — ABNORMAL HIGH (ref 0.61–1.24)
GFR, Estimated: 18 mL/min — ABNORMAL LOW (ref >60)
Glucose, Bld: 169 mg/dL — ABNORMAL HIGH (ref 70–99)
Potassium: 4.2 mmol/L (ref 3.5–5.1)
Sodium: 137 mmol/L (ref 135–145)

## 2021-02-18 NOTE — Telephone Encounter (Signed)
Sent pt a my chart message regarding fax from [facility].  Good morning [name]. Dr.Scott received a fax from [place] [facility] stating that you had opted into their pharmacy for all refills. Their is a statement of consent attached to fax that states verbal consent obtained on Monday October 05, 2020. Dr.Scott is wanting to make sure this is correct and wanted [place] to verify with you if you wanted to start using [facility]. Have a great day!  Per Pt:Yes definitely  Thank you  [name]

## 2021-02-22 ENCOUNTER — Telehealth: Payer: Self-pay | Admitting: Family Medicine

## 2021-02-22 NOTE — Telephone Encounter (Signed)
Nurses  Please see the form from pharmacy requesting furosemide and valsartan  #1 I am not certain if he is truly on valsartan 2.  It is not on his medicine list 3.  He has severe renal disease which most of the time valsartan has already been stopped 4.  Is he taking valsartan? 5.  Who is his nephrologist? 6.  If he says he is taking valsartan we need to speak with his kidney doctor to find out is he truly supposed to be on valsartan(please take the initiative to go ahead and talk with the kidney doctor or his nurse to verify that he is on this medicine or should he not be on valsartan? This is not a simple issue This seems to be a very convoluted issue which will need months clarification before we can safely prescribe these medicines  Please look into this thoroughly and then let me know

## 2021-02-23 ENCOUNTER — Encounter (HOSPITAL_BASED_OUTPATIENT_CLINIC_OR_DEPARTMENT_OTHER): Payer: Self-pay | Admitting: Cardiovascular Disease

## 2021-02-23 ENCOUNTER — Other Ambulatory Visit: Payer: Self-pay

## 2021-02-23 ENCOUNTER — Ambulatory Visit (INDEPENDENT_AMBULATORY_CARE_PROVIDER_SITE_OTHER): Payer: Medicare Other | Admitting: Cardiovascular Disease

## 2021-02-23 DIAGNOSIS — I5042 Chronic combined systolic (congestive) and diastolic (congestive) heart failure: Secondary | ICD-10-CM

## 2021-02-23 DIAGNOSIS — H5461 Unqualified visual loss, right eye, normal vision left eye: Secondary | ICD-10-CM | POA: Insufficient documentation

## 2021-02-23 DIAGNOSIS — I2510 Atherosclerotic heart disease of native coronary artery without angina pectoris: Secondary | ICD-10-CM

## 2021-02-23 DIAGNOSIS — I10 Essential (primary) hypertension: Secondary | ICD-10-CM | POA: Diagnosis not present

## 2021-02-23 DIAGNOSIS — E785 Hyperlipidemia, unspecified: Secondary | ICD-10-CM

## 2021-02-23 DIAGNOSIS — E1169 Type 2 diabetes mellitus with other specified complication: Secondary | ICD-10-CM

## 2021-02-23 HISTORY — DX: Unqualified visual loss, right eye, normal vision left eye: H54.61

## 2021-02-23 MED ORDER — ISOSORB DINITRATE-HYDRALAZINE 20-37.5 MG PO TABS: 1.0000 | ORAL_TABLET | Freq: Two times a day (BID) | ORAL | 1 refills | Status: DC

## 2021-02-23 MED ORDER — VALSARTAN 80 MG PO TABS: 80.0000 mg | ORAL_TABLET | Freq: Every day | ORAL | 1 refills | Status: DC

## 2021-02-23 NOTE — Progress Notes (Signed)
Advanced Hypertension Clinic Follow up    Date:  02/23/2021   ID:  [name], DOB 29-Nov-1955, MRN [id number]  PCP:  [name], MD  Cardiologist:  Skeet Latch, MD  Nephrologist:  Referring MD: [name], MD   CC: Hypertension  History of Present Illness:    [name] is a 65 y.o. male with a hx of CAD status post CABG, chronic systolic and diastolic heart failure, hypertension, hyperlipidemia, diabetes, peripheral arterial disease, and CKD 3 here for follow-up. He initially established care in the hypertension clinic 10/14/2020. He had a four-vessel CABG on 03/2019.  He has struggled with poorly controlled blood pressures and his blood pressure has been as high as the 200s over 100s.  He has not tolerated amlodipine, lisinopril, HCTZ, or beta-blockers due to them making him feel fatigued.  He saw [name] on 7/23 and his blood pressure was 172/88.  He was referred to advanced hypertension clinic for further evaluation and management.  He notes that his BP is labile.   At his intial appointment he reported shortness of breath and lightheadedness.  Dr. Moshe Cipro started him on lasix which has helped his edema.  This was increased to 33m daily after a hospitalization for acute on chronic diastolic heart failure.  He has been working on his diet.  He limits carbohydrates and has a high protein diet.  He limits red meat.  He drinks a lot of water and limits soda.  He quit smoking 25-30 years ago.  He smoked 2ppd x10-15 years.   At his initial appointment he was started on doxazosin.  He followed up with our pharmacist and started on valsartan. At his last appointment he had orthostatic drops in his blood pressures to the 90s, so doxazosin was reduced. He followed up with our pharmacist 08/2020 and his blood pressure was in the 140s but no changes were made due to his issues with low blood pressure in the past.   He saw CLaurann Montana NP in 01/2021 after wearing a Zio  monitor that showed 10 episodes of SVT and 3 short runs of NSVT. He reported feeling lightheaded and generally unwell. He was referred for a cardiac Cath, but because of his kidney function had a nuclear stress test instead. His stress showed LVEF 28% with prior infarct and peri-infarct ischemia in the inferior and inferolateral regions.  Today, [name] accompanied by his wife. About 2.5 weeks ago a blood vessel burst in his left eye. Yesterday this occurred in his right eye while bending over to pick up laundry. He can see limited shapes and outlines only. From a cardiovascular standpoint he appears well. A BP log shows his blood pressure has improved but continues to fluctuate. He denies chest pain and edema. For his diet, he may have 1 cup of coffee or tea in the morning, but mostly drinks water throughout the day. Rarely he may drink 1/2 of a soda. Usually he has 2 meals a day, the second meal around 2-3 PM. He has not been able to participate in formal exercise due to his sight. Mostly he is fearful of falling due to instability. Previously he was taking valsartan on an empty stomach and experienced stomach pain. At night he is taking doxazosin. Every night he uses a BiPAP. Recently he was told he is not a candidate for a kidney transplant. He denies any palpitations, or shortness of breath. No lightheadedness, headaches, syncope, orthopnea, or PND. Also has  no exertional symptoms.  Previous antihypertensives: Amlodipine Lisinopril HCTZ Beta-blockers Hydralazine   Past Medical History:  Diagnosis Date   Arthritis    hips shoulders   CAD (coronary artery disease) 03/03/2019   CHF (congestive heart failure) (HCC) 2020   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   Admitted 11/14-11/20/19-diuresed 10L   Fatigue 10/14/2020   Hypertension    Myocardial infarction (HCC)    Sleep apnea    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    Past Surgical History:  Procedure Laterality Date   BIOPSY  06/27/2019   Procedure: BIOPSY;  Surgeon: [name], Najeeb U, MD;  Location: [facility];  Service: Endoscopy;;  ascending colon polyp   BIOPSY  10/21/2020   Procedure: BIOPSY;  Surgeon: [name], Najeeb U, MD;  Location: [facility];  Service: Endoscopy;;  esophageal,gastric polyp   CARDIAC SURGERY     CATARACT EXTRACTION     COLONOSCOPY N/A 06/27/2019   [name]:Diverticulosis in the entire examined colon. tubular adenoma in ascending colon, 4mm tubular adenoma in prox sigmoid. external hemorrhoids   CORONARY ARTERY BYPASS GRAFT N/A 03/25/2019   Procedure: CORONARY ARTERY BYPASS GRAFTING (CABG) X  4 USING LEFT INTERNAL MAMMARY ARTERY AND RIGHT SAPHENOUS VEIN GRAFTS;  Surgeon: Lightfoot, Harrell O, MD;  Location: [facility];  Service: Open Heart Surgery;  Laterality: N/A;   ESOPHAGOGASTRODUODENOSCOPY (EGD) WITH PROPOFOL N/A 10/21/2020   rehman:Normal hypopharynx.Normal proximal esophagus and mid esophagus.Esophageal mucosal changes consistent with long-segment Barrett's esophagus. (Focal low-grade dysplasia and atypia proximally) z line irregular 37 cm from incisors, 3 cm HH, single gastric polyp (fundic gland) normal duodenal bulb/second portion of duodenum, proximal margin of Barrett's at 34 cm   EXCISION MORTON'S NEUROMA     EYE SURGERY Left    retina   POLYPECTOMY  06/27/2019   Procedure: POLYPECTOMY;  Surgeon: [name], Najeeb U, MD;  Location: [facility];  Service: Endoscopy;;  proximal sigmoid colon   RIGHT/LEFT HEART CATH AND CORONARY ANGIOGRAPHY N/A 03/12/2019   Procedure: RIGHT/LEFT HEART CATH AND CORONARY ANGIOGRAPHY;  Surgeon: Varanasi, Jayadeep S, MD;  Location: [facility];  Service: Cardiovascular;  Laterality: N/A;   SHOULDER ARTHROSCOPY WITH ROTATOR CUFF REPAIR AND SUBACROMIAL DECOMPRESSION Right 08/26/2020   Procedure: RIGHT SHOULDER MINI OPEN ROTATOR CUFF REPAIR AND  SUBACROMIAL DECOMPRESSION WITH PATCH GRAFT;  Surgeon: Beane, Jeffrey, MD;  Location: [facility];  Service: Orthopedics;  Laterality: Right;  90 MINS GENERAL WITH BLOCK   TEE WITHOUT CARDIOVERSION N/A 03/25/2019   Procedure: TRANSESOPHAGEAL ECHOCARDIOGRAM (TEE);  Surgeon: Lightfoot, Harrell O, MD;  Location: [facility];  Service: Open Heart Surgery;  Laterality: N/A;    Current Medications: Current Meds  Medication Sig   acetaminophen (TYLENOL) 500 MG tablet Take 1,000 mg by mouth every 6 (six) hours as needed for moderate pain or headache.    albuterol (VENTOLIN HFA) 108 (90 Base) MCG/ACT inhaler Inhale 2 puffs into the lungs every 6 (six) hours as needed. (Patient taking differently: Inhale 2 puffs into the lungs every 6 (six) hours as needed for wheezing or shortness of breath.)   aspirin EC 81 MG tablet Take 325 mg by mouth daily. Swallow whole.   carvedilol (COREG) 12.5 MG tablet Take 1 tablet (12.5 mg total) by mouth 2 (two) times daily.   doxazosin (CARDURA) 4 MG tablet Take 4 mg by mouth daily.   Evolocumab (REPATHA SURECLICK) 140 MG/ML SOAJ Inject 140 mg   into the skin every 14 (fourteen) days.   furosemide (LASIX) 40 MG tablet Take 40 mg by mouth daily.   isosorbide-hydrALAZINE (BIDIL) 20-37.5 MG tablet Take 1 tablet by mouth in the morning and at bedtime.   OZEMPIC, 1 MG/DOSE, 4 MG/3ML SOPN Inject 1 mg into the skin once a week.   pantoprazole (PROTONIX) 40 MG tablet Take 1 tablet (40 mg total) by mouth 2 (two) times daily before a meal.   sertraline (ZOLOFT) 100 MG tablet TAKE 1 TABLET BY MOUTH DAILY (Patient taking differently: Take 100 mg by mouth daily.)   valsartan (DIOVAN) 80 MG tablet Take 1 tablet (80 mg total) by mouth daily.   [DISCONTINUED] amLODipine (NORVASC) 10 MG tablet Take 10 mg by mouth daily.   Current Facility-Administered Medications for the 02/23/21 encounter (Office Visit) with [place], , MD  Medication   sodium chloride flush (NS) 0.9 % injection 3 mL      Allergies:   Bee venom, Atorvastatin, Diltiazem, Rosuvastatin, and Valsartan   Social History   Socioeconomic History   Marital status: Single    Spouse name: Not on file   Number of children: Not on file   Years of education: Not on file   Highest education level: Not on file  Occupational History   Not on file  Tobacco Use   Smoking status: Former    Types: Cigarettes    Quit date: 07/06/1988    Years since quitting: 32.6   Smokeless tobacco: Never  Vaping Use   Vaping Use: Never used  Substance and Sexual Activity   Alcohol use: Not Currently    Comment: socially   Drug use: No   Sexual activity: Not on file  Other Topics Concern   Not on file  Social History Narrative   Not on file   Social Determinants of Health   Financial Resource Strain: Not on file  Food Insecurity: Not on file  Transportation Needs: Not on file  Physical Activity: Not on file  Stress: Not on file  Social Connections: Not on file     Family History: The patient's family history includes Colon cancer in his mother; Diabetes in his father; Heart Problems in his father; Valvular heart disease in his father.  ROS:   Please see the history of present illness.    (+) Sight impairment (+) Gait instability All other systems reviewed and are negative.  EKGs/Labs/Other Studies Reviewed:    EKG:     02/23/2021: EKG is not ordered today. 10/14/2020: EKG was not ordered. 06/17/2020: EKG was not ordered. 01/01/20: Sinus rhythm.  Rate 87 bpm.  Nonspecific T wave abnormalities.   NM Stress 02/12/2021: Narrative & Impression      Findings are consistent with prior myocardial infarction with peri-infarct ischemia. The study is high risk.   No ST deviation was noted.   LV perfusion is abnormal. There is evidence of ischemia. There is evidence of infarction. Defect 1: There is a medium defect with moderate reduction in uptake present in the mid to basal inferior and inferolateral location(s) that is  fixed. There is abnormal wall motion in the defect area. Consistent with infarction and peri-infarct ischemia.   Left ventricular function is abnormal. Global function is severely reduced. There was a single regional abnormality. Nuclear stress EF: 28 %. The left ventricular ejection fraction is severely decreased (<30%). End diastolic cavity size is moderately enlarged. End systolic cavity size is moderately enlarged.   Prior study available for comparison from 09/25/2019. There   are changes compared to prior study. The left ventricular ejection fraction has decreased. LVEF 35%, moderate size and intensity fixed inferior defect suggestive of scar with mild peri-infarct ischemia   Moderate size and intensity, mostly fixed basal to mid inferior and inferolateral perfusion defect (SDS 1), suggestive of scar with minimal peri-infarct ischemia. LVEF 28% with global hypokinesis and basal to mid inferior akinesis. This is a high risk study. In comparison with a prior study in 2021, the LVEF is lower (was previously 35%), however, there are no new reversible perfusion defects.    Echo 02/04/2021:  1. Left ventricular ejection fraction, by estimation, is 45 to 50%. The  left ventricle has mildly decreased function. The left ventricle  demonstrates regional wall motion abnormalities (see scoring  diagram/findings for description). The left ventricular   internal cavity size was moderately to severely dilated. Left ventricular  diastolic parameters are consistent with Grade I diastolic dysfunction  (impaired relaxation). There is severe dyskinesis of the left ventricular,  basal-mid inferior wall.   2. Right ventricular systolic function is normal. The right ventricular  size is normal.   3. Left atrial size was severely dilated.   4. Right atrial size was mildly dilated.   5. The mitral valve is normal in structure. Mild mitral valve  regurgitation.   6. The aortic valve is grossly normal. Aortic valve  regurgitation is not  visualized.   7-Day Monitor 01/06/2021: 7 Day Event Monitor   Quality: Fair.  Baseline artifact. Predominant rhythm: sinus rhythm Average heart rate: 83 bpm Max heart rate: 132 bpm Min heart rate: 67 bpm Pauses >2.5 seconds: none   Frequent PVCs (3.4%) 3 episodes of NSVT up to 5 beats  Up to 12 beats of SVT  Echo 09/2019:  1. Left ventricular ejection fraction, by estimation, is 55 to 60%. The  left ventricle has normal function. The left ventricle has no regional  wall motion abnormalities. There is moderate left ventricular hypertrophy.  Left ventricular diastolic  parameters are consistent with Grade I diastolic dysfunction (impaired  relaxation).   2. Right ventricular systolic function is normal. The right ventricular  size is normal.   3. Left atrial size was severely dilated.   4. The mitral valve is normal in structure. Trivial mitral valve  regurgitation. No evidence of mitral stenosis.   5. The aortic valve was not well visualized. Aortic valve regurgitation  is not visualized. No aortic stenosis is present.   6. The inferior vena cava is normal in size with greater than 50%  respiratory variability, suggesting right atrial pressure of 3 mmHg.   Echo 05/29/2019 1. Left ventricular ejection fraction, by visual estimation, is 50 to  55%. The left ventricle has low normal function. There is mildly increased  left ventricular hypertrophy.   2. Basal anterolateral segment, basal inferior segment, and basal  inferolateral segment are abnormal.   3. The left ventricle demonstrates regional wall motion abnormalities.   4. Global right ventricle has mildly reduced systolic function.The right  ventricular size is normal. No increase in right ventricular wall  thickness.   5. Left atrial size was moderately dilated.   6. Right atrial size was normal.   7. Moderate mitral annular calcification.   8. The mitral valve is grossly normal. Trivial mitral  valve  regurgitation.   9. The tricuspid valve is grossly normal. Tricuspid valve regurgitation  is trivial.  10. The aortic valve is tricuspid. Aortic valve regurgitation is not  visualized.  11. The   pulmonic valve was grossly normal. Pulmonic valve regurgitation is  trivial.  12. Normal pulmonary artery systolic pressure.  13. The tricuspid regurgitant velocity is 2.30 m/s, and with an assumed  right atrial pressure of 3 mmHg, the estimated right ventricular systolic  pressure is normal at 24.2 mmHg.  14. The inferior vena cava is normal in size with greater than 50%  respiratory variability, suggesting right atrial pressure of 3 mmHg.   VAS [place] LOWER EXTREMITY VENOUS 03/30/2019: Right: There is no evidence of deep vein thrombosis in the lower  extremity.  Left: No evidence of common femoral vein obstruction.  ECHO INTRAOPERATIVE TEE 03/25/2019: POST-OP IMPRESSIONS  - Aorta: The aorta appears unchanged from pre-bypass.  - Left Atrial Appendage: The left atrial appendage appears unchanged from  pre-bypass.  - Aortic Valve: The aortic valve appears unchanged from pre-bypass.  - Mitral Valve: The mitral valve appears unchanged from pre-bypass.  - Tricuspid Valve: The tricuspid valve appears unchanged from pre-bypass.  - Pericardium: The pericardium appears unchanged from pre-bypass.  VAS US DOPPLER PRE CABG 03/21/2019:  Right Carotid: Velocities in the right ICA are consistent with a 1-39%  stenosis.   Left Carotid: There is no evidence of stenosis in the left ICA.  Vertebrals: Bilateral vertebral arteries demonstrate antegrade flow.   Bilateral Extremity: Doppler waveforms remain within normal limits with  compression bilaterally for the radial arteries. Doppler waveforms remain  within normal limits with compression bilaterally for the ulnar arteries.   Echo 03/15/2019  1. Left ventricular ejection fraction, by visual estimation, is 45 to  50%. The left ventricle has mildly  decreased function. There is mildly  increased left ventricular hypertrophy.   2. Left ventricular diastolic Doppler parameters are consistent with  impaired relaxation pattern of LV diastolic filling.   3. Global right ventricle has normal systolic function.The right  ventricular size is normal. No increase in right ventricular wall  thickness.   4. Left atrial size was severely dilated.   5. Right atrial size was normal.   6. Mild mitral annular calcification.   7. Moderate calcification of the mitral valve leaflet(s).   8. Moderate thickening of the mitral valve leaflet(s).   9. The mitral valve is abnormal. Trace mitral valve regurgitation. No  evidence of mitral stenosis.  10. The tricuspid valve is normal in structure. Tricuspid valve  regurgitation is trivial.  11. The aortic valve is tricuspid Aortic valve regurgitation was not  visualized by color flow Doppler. Mild aortic valve sclerosis without  stenosis.  12. The pulmonic valve was not well visualized. Pulmonic valve  regurgitation is mild by color flow Doppler.  13. Mildly elevated pulmonary artery systolic pressure.   Right/Left Heart Cath and Coronary Angiography 03/12/2019: Mid LAD lesion is 80% stenosed. Ost LAD to Mid LAD lesion is 50% stenosed. 1st Diag lesion is 80% stenosed. 2nd Diag lesion is 75% stenosed. Mid LM to Dist LM lesion is 25% stenosed. Prox Cx lesion is 90% stenosed. 1st Mrg lesion is 80% stenosed. 2nd Mrg lesion is 80% stenosed. Dist RCA lesion is 90% stenosed. Mid RCA lesion is 75% stenosed. RPAV lesion is 80% stenosed. LV end diastolic pressure is moderately elevated. There is no aortic valve stenosis. Hemodynamic findings consistent with mild pulmonary hypertension.   Severe multivessel CAD.  Plan for outpatient evaluation for CABG>  His symptoms are all with exertion.  He feels fine at rest.  I instructed him to avoid any strenuous activity, or anything that would bring on shortness  of  breath.  Continue aspirin and Imdur.   Continue Crestor.   Echo 04/26/2018: - Left ventricle: The cavity size was normal. There was minimal    concentric hypertrophy. Systolic function was mildly reduced. The    estimated ejection fraction was in the range of 45% to 50%.    Diffuse hypokinesis. Findings consistent with left ventricular    diastolic dysfunction, grade indeterminate. Doppler parameters    are consistent with high ventricular filling pressure.  - Mitral valve: Mildly calcified annulus. There was mild    regurgitation.  - Left atrium: The atrium was severely dilated.  - Right atrium: The atrium was mildly dilated.  - Inferior vena cava: The vessel was dilated. The respirophasic    diameter changes were blunted (< 50%), consistent with elevated    central venous pressure. Estimated CVP 15 mmHg.    Recent Labs: 10/19/2020: ALT 9 12/14/2020: TSH 2.018 02/18/2021: BUN 46; Creatinine, Ser 3.65; Hemoglobin 11.6; Platelets 292; Potassium 4.2; Sodium 137   Recent Lipid Panel    Component Value Date/Time   CHOL 168 12/25/2020 1208   TRIG 272 (H) 12/25/2020 1208   HDL 48 12/25/2020 1208   CHOLHDL 3.5 12/25/2020 1208   CHOLHDL 3.9 04/26/2018 1011   VLDL 20 04/26/2018 1011   LDLCALC 76 12/25/2020 1208    Physical Exam:    VS:  BP (!) 146/88   Pulse 76   Ht 5' 11" (1.803 m)   Wt 204 lb 6.4 oz (92.7 kg)   SpO2 98%   BMI 28.51 kg/m  , BMI Body mass index is 28.51 kg/m. GENERAL:  Well appearing HEENT: Oral mucosa unremarkable NECK:  No jugular venous distention, waveform within normal limits, carotid upstroke [name] and symmetric, no bruits, no thyromegaly LUNGS:  Clear to auscultation bilaterally HEART:  RRR.  PMI not displaced or sustained,S1 and S2 within normal limits, no S3, no S4, no clicks, no rubs, no murmurs ABD:  Flat, positive bowel sounds normal in frequency in pitch, no bruits, no rebound, no guarding, no midline pulsatile mass, no hepatomegaly, no  splenomegaly EXT:  2 plus pulses throughout, no edema, no cyanosis no clubbing SKIN:  No rashes no nodules NEURO:  Cranial nerves II through XII grossly intact, motor grossly intact throughout PSYCH:  Cognitively intact, oriented to person place and time   ASSESSMENT/PLAN:   Chronic combined systolic and diastolic heart failure (HCC) LVEF 28% on stress test.  He  Had mostly infarct and mild peri-infarct ischemia. His systolic function is slowly declining.  He is euvolemic and has no shortness of breath.  We will stop amlodipine.  He was concerned about valsartan and stopped it due to concern about his kidneys.  I discussed this with his nephrologist who agrees he should be on valsartan.  We will resume valsartan.  Given his systolic dysfunction, stop amlodipine.  We will start BiDil 1 tab bid.  Continue carvedilol and lasix.  eGFR too low for [place].  Essential hypertension, benign BP uncontrolled.  Stopping amlodipine 2/2 systolic dysfunction.  Add BiDil and continue carvedilol and doxazosin.  Hyperlipidemia associated with type 2 diabetes mellitus ([place]) He has not tolerated statins.  Continue Repatha.  CAD (coronary artery disease) S/p CABG.  He doesn't have angina.  Continue aspirin, carvedilol, and Repatha.  Vision loss of right eye [name] lost his vision acutely today.  He is going to see his ophthalmologist today.  He had acute vision loss in the L eye last week.  He  has been getting injections. Unclear his exact diagnosis.  I urged him to ensure that he sees ophthalmology urgently today.   1. Chronic combined systolic and diastolic heart failure (HCC)   2. Essential hypertension, benign   3. Hyperlipidemia associated with type 2 diabetes mellitus (HCC)   4. Coronary artery disease involving native coronary artery of native heart without angina pectoris   5. Vision loss of right eye       Disposition:    FU with MD/PharmD in 2 months.   Medication Adjustments/Labs  and Tests Ordered: Current medicines are reviewed at length with the patient today.  Concerns regarding medicines are outlined above.  No orders of the defined types were placed in this encounter.  Meds ordered this encounter  Medications   isosorbide-hydrALAZINE (BIDIL) 20-37.5 MG tablet    Sig: Take 1 tablet by mouth in the morning and at bedtime.    Dispense:  180 tablet    Refill:  1   valsartan (DIOVAN) 80 MG tablet    Sig: Take 1 tablet (80 mg total) by mouth daily.    Dispense:  90 tablet    Refill:  1     I,Mathew Stumpf,acting as a scribe for  [place], MD.,have documented all relevant documentation on the behalf of  [place], MD,as directed by   [place], MD while in the presence of  [place], MD.  I,  C. [place], MD have reviewed all documentation for this visit.  The documentation of the exam, diagnosis, procedures, and orders on 02/23/2021 are all accurate and complete.   Signed,  [place], MD  02/23/2021 4:29 PM    [facility]  

## 2021-02-23 NOTE — Telephone Encounter (Signed)
Sent mychart message

## 2021-02-23 NOTE — Assessment & Plan Note (Signed)
LVEF 28% on stress test.  He  Had mostly infarct and mild peri-infarct ischemia. His systolic function is slowly declining.  He is euvolemic and has no shortness of breath.  We will stop amlodipine.  He was concerned about valsartan and stopped it due to concern about his kidneys.  I discussed this with his nephrologist who agrees he should be on valsartan.  We will resume valsartan.  Given his systolic dysfunction, stop amlodipine.  We will start BiDil 1 tab bid.  Continue carvedilol and lasix.  eGFR too low for [place].

## 2021-02-23 NOTE — Assessment & Plan Note (Signed)
Mr. Weckerly lost his vision acutely today.  He is going to see his ophthalmologist today.  He had acute vision loss in the L eye last week.  He has been getting injections. Unclear his exact diagnosis.  I urged him to ensure that he sees ophthalmology urgently today.

## 2021-02-23 NOTE — Patient Instructions (Signed)
Medication Instructions:  STOP AMLODIPINE   START VALSARTAN 80 MG DAILY   START BIDIL TWICE A DAY   *If you need a refill on your cardiac medications before your next appointment, please call your pharmacy*  Lab Work: NONE   Testing/Procedures: NONE   Follow-Up: At [facility], you and your health needs are our priority.  As part of our continuing mission to provide you with exceptional heart care, we have created designated Provider Care Teams.  These Care Teams include your primary Cardiologist (physician) and Advanced Practice Providers (APPs -  Physician Assistants and Nurse Practitioners) who all work together to provide you with the care you need, when you need it.  We recommend signing up for the patient portal called "MyChart".  Sign up information is provided on this After Visit Summary.  MyChart is used to connect with patients for Virtual Visits (Telemedicine).  Patients are able to view lab/test results, encounter notes, upcoming appointments, etc.  Non-urgent messages can be sent to your provider as well.   To learn more about what you can do with MyChart, go to [email or web address].    Your next appointment:   2 month(s)  The format for your next appointment:   In Person  Provider:   Skeet [facility], MD  IF YOU DO NO [address] [place] RECOMMENDS GOING TO [place]

## 2021-02-23 NOTE — Assessment & Plan Note (Signed)
He has not tolerated statins.  Continue Repatha.

## 2021-02-23 NOTE — Assessment & Plan Note (Signed)
BP uncontrolled.  Stopping amlodipine 2/2 systolic dysfunction.  Add BiDil and continue carvedilol and doxazosin.

## 2021-02-23 NOTE — Assessment & Plan Note (Signed)
S/p CABG.  He doesn't have angina.  Continue aspirin, carvedilol, and Repatha.

## 2021-02-23 NOTE — Telephone Encounter (Signed)
Patient seen in office today. 

## 2021-02-24 ENCOUNTER — Other Ambulatory Visit: Payer: Self-pay

## 2021-02-24 ENCOUNTER — Encounter (HOSPITAL_COMMUNITY): Payer: Self-pay | Admitting: Internal Medicine

## 2021-02-24 ENCOUNTER — Ambulatory Visit (HOSPITAL_COMMUNITY): Payer: Medicare Other | Admitting: Anesthesiology

## 2021-02-24 ENCOUNTER — Encounter (HOSPITAL_COMMUNITY)
Admission: RE | Disposition: A | Discharge status: Home / Self Care | Payer: Self-pay | Source: Home / Self Care | Attending: Internal Medicine

## 2021-02-24 ENCOUNTER — Ambulatory Visit (HOSPITAL_COMMUNITY)
Admission: RE | Admit: 2021-02-24 | Discharge: 2021-02-24 | Disposition: A | Discharge status: Home / Self Care | Payer: Medicare Other | Attending: Internal Medicine | Admitting: Internal Medicine

## 2021-02-24 DIAGNOSIS — K449 Diaphragmatic hernia without obstruction or gangrene: Secondary | ICD-10-CM | POA: Diagnosis not present

## 2021-02-24 DIAGNOSIS — Z800 Family history of malignant neoplasm of digestive organs: Secondary | ICD-10-CM | POA: Diagnosis not present

## 2021-02-24 DIAGNOSIS — C153 Malignant neoplasm of upper third of esophagus: Secondary | ICD-10-CM | POA: Diagnosis not present

## 2021-02-24 DIAGNOSIS — Z888 Allergy status to other drugs, medicaments and biological substances status: Secondary | ICD-10-CM | POA: Insufficient documentation

## 2021-02-24 DIAGNOSIS — Z87891 Personal history of nicotine dependence: Secondary | ICD-10-CM | POA: Insufficient documentation

## 2021-02-24 DIAGNOSIS — I110 Hypertensive heart disease with heart failure: Secondary | ICD-10-CM | POA: Diagnosis not present

## 2021-02-24 DIAGNOSIS — Z7982 Long term (current) use of aspirin: Secondary | ICD-10-CM | POA: Diagnosis not present

## 2021-02-24 DIAGNOSIS — R1319 Other dysphagia: Secondary | ICD-10-CM

## 2021-02-24 DIAGNOSIS — E1121 Type 2 diabetes mellitus with diabetic nephropathy: Secondary | ICD-10-CM | POA: Insufficient documentation

## 2021-02-24 DIAGNOSIS — Z91030 Bee allergy status: Secondary | ICD-10-CM | POA: Insufficient documentation

## 2021-02-24 DIAGNOSIS — Z833 Family history of diabetes mellitus: Secondary | ICD-10-CM | POA: Diagnosis not present

## 2021-02-24 DIAGNOSIS — N183 Chronic kidney disease, stage 3 unspecified: Secondary | ICD-10-CM | POA: Diagnosis not present

## 2021-02-24 DIAGNOSIS — K22710 Barrett's esophagus with low grade dysplasia: Secondary | ICD-10-CM | POA: Diagnosis not present

## 2021-02-24 DIAGNOSIS — Z8249 Family history of ischemic heart disease and other diseases of the circulatory system: Secondary | ICD-10-CM | POA: Insufficient documentation

## 2021-02-24 DIAGNOSIS — Z951 Presence of aortocoronary bypass graft: Secondary | ICD-10-CM | POA: Insufficient documentation

## 2021-02-24 DIAGNOSIS — Z794 Long term (current) use of insulin: Secondary | ICD-10-CM | POA: Diagnosis not present

## 2021-02-24 DIAGNOSIS — K317 Polyp of stomach and duodenum: Secondary | ICD-10-CM | POA: Diagnosis not present

## 2021-02-24 DIAGNOSIS — C159 Malignant neoplasm of esophagus, unspecified: Secondary | ICD-10-CM | POA: Diagnosis not present

## 2021-02-24 DIAGNOSIS — I5042 Chronic combined systolic (congestive) and diastolic (congestive) heart failure: Secondary | ICD-10-CM | POA: Insufficient documentation

## 2021-02-24 DIAGNOSIS — Z79899 Other long term (current) drug therapy: Secondary | ICD-10-CM | POA: Insufficient documentation

## 2021-02-24 DIAGNOSIS — I130 Hypertensive heart and chronic kidney disease with heart failure and stage 1 through stage 4 chronic kidney disease, or unspecified chronic kidney disease: Secondary | ICD-10-CM | POA: Diagnosis not present

## 2021-02-24 HISTORY — PX: ESOPHAGOGASTRODUODENOSCOPY (EGD) WITH PROPOFOL: SHX5813

## 2021-02-24 HISTORY — PX: BIOPSY: SHX5522

## 2021-02-24 LAB — GLUCOSE, CAPILLARY
Glucose-Capillary: 128 mg/dL — ABNORMAL HIGH (ref 70–99)
Glucose-Capillary: 93 mg/dL (ref 70–99)

## 2021-02-24 SURGERY — ESOPHAGOGASTRODUODENOSCOPY (EGD) WITH PROPOFOL
Anesthesia: General

## 2021-02-24 MED ORDER — LIDOCAINE HCL (CARDIAC) PF 100 MG/5ML IV SOSY
PREFILLED_SYRINGE | INTRAVENOUS | Status: DC | PRN
  Administered 2021-02-24: 100 mg via INTRATRACHEAL

## 2021-02-24 MED ORDER — LACTATED RINGERS IV SOLN: INTRAVENOUS | Status: DC

## 2021-02-24 MED ORDER — PROPOFOL 10 MG/ML IV BOLUS
INTRAVENOUS | Status: DC | PRN
  Administered 2021-02-24: 120 mg via INTRAVENOUS

## 2021-02-24 MED ORDER — PROPOFOL 500 MG/50ML IV EMUL
INTRAVENOUS | Status: DC | PRN
  Administered 2021-02-24: 150 ug/kg/min via INTRAVENOUS

## 2021-02-24 NOTE — Transfer of Care (Signed)
Immediate Anesthesia Transfer of Care Note  Patient: [name]  Procedure(s) Performed: ESOPHAGOGASTRODUODENOSCOPY (EGD) WITH PROPOFOL BIOPSY  Patient Location: PACU  Anesthesia Type:General  Level of Consciousness: awake, sedated and drowsy  Airway & Oxygen Therapy: Patient Spontanous Breathing and Patient connected to nasal cannula oxygen  Post-op Assessment: Report given to RN and Post -op Vital signs reviewed and stable  Post vital signs: Reviewed and stable  Last Vitals:  Vitals Value Taken Time  BP 103/63 02/24/21 1633  Temp 36.6 C 02/24/21 1633  Pulse 82 02/24/21 1633  Resp 18 02/24/21 1633  SpO2 96 % 02/24/21 1633    Last Pain:  Vitals:   02/24/21 1633  TempSrc: Oral  PainSc: 0-No pain      Patients Stated Pain Goal: 7 (25/08/71 9941)  Complications: No notable events documented.

## 2021-02-24 NOTE — Telephone Encounter (Signed)
The form was filled out please see form

## 2021-02-24 NOTE — Telephone Encounter (Signed)
Contacted patient. Pt states Dr.Sarasota (cardiologist) placed him on Valsartan 80 mg once daily in the evening. Pt did see cardiology yesterday and Dr.Creola spoke with Dr.Goldsborough (nephrologist) and confirmed that pt should be taking Valsartan. Please see cardiology office note from yesterday. Please advise. Thank you.

## 2021-02-24 NOTE — Anesthesia Preprocedure Evaluation (Addendum)
Anesthesia Evaluation  Patient identified by MRN, date of birth, ID band Patient awake    Reviewed: Allergy & Precautions, NPO status , Patient's Chart, lab work & pertinent test results  Airway Mallampati: II  TM Distance: >3 FB Neck ROM: Full    Dental  (+) Dental Advisory Given, Missing   Pulmonary sleep apnea (BiPAP) and Continuous Positive Airway Pressure Ventilation , former smoker,    Pulmonary exam normal breath sounds clear to auscultation       Cardiovascular Exercise Tolerance: Good hypertension, Pt. on medications + CAD, + Past MI, + CABG, + Peripheral Vascular Disease and +CHF (EF 55%)  Normal cardiovascular exam+ dysrhythmias (occasional PVCs)  Rhythm:Regular Rate:Normal  21-Aug-2020 14:24:36 [facility]-PRE ROUTINE RECORD Normal sinus rhythm Inferior infarct , age undetermined ST & T wave abnormality, consider lateral ischemia Abnormal ECG No significant change since last tracing Confirmed by Glori Bickers [phone]) on 08/21/2020 9:46:12 PM   Neuro/Psych  Neuromuscular disease negative psych ROS   GI/Hepatic GERD (gastroparesis?)  Medicated,  Endo/Other  diabetes, Well Controlled, Type 2, Insulin Dependent  Renal/GU Renal InsufficiencyRenal disease     Musculoskeletal  (+) Arthritis , Osteoarthritis,    Abdominal   Peds  Hematology negative hematology ROS (+) anemia ,   Anesthesia Other Findings Right shoulder sx  Reproductive/Obstetrics                          Anesthesia Physical  Anesthesia Plan  ASA: 3  Anesthesia Plan: General   Post-op Pain Management:    Induction: Intravenous  PONV Risk Score and Plan: Propofol infusion  Airway Management Planned: Nasal Cannula, Natural Airway and Simple Face Mask  Additional Equipment:   Intra-op Plan:   Post-operative Plan:   Informed Consent: I have reviewed the patients History and Physical,  chart, labs and discussed the procedure including the risks, benefits and alternatives for the proposed anesthesia with the patient or authorized representative who has indicated his/her understanding and acceptance.     Dental advisory given  Plan Discussed with: Surgeon and CRNA  Anesthesia Plan Comments:         Anesthesia Quick Evaluation

## 2021-02-24 NOTE — Discharge Instructions (Addendum)
Resume aspirin on 02/26/2021. Resume other medications as before. Resume usual diet. No driving for 24 hours. Physician will call with biopsy results.

## 2021-02-24 NOTE — H&P (Signed)
[name] is an 65 y.o. male.   Chief Complaint: Patient is here for esophagogastroduodenoscopy. HPI: Patient is 65 year old Caucasian male who underwent esophagogastroduodenoscopy 4 months ago.  He was found to have long segment Barrett's esophagus and esophageal biopsy revealed low-grade dysplasia.  He has been maintained on a PPI and dietary measures and is doing well.  He denies heartburn dysphagia nausea vomiting or epigastric pain.  He is returning for follow-up exam to make sure dysphagia is stable and/or resolved.  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 tubular adenoma in ascending colon, 46mm tubular adenoma in prox sigmoid. external hemorrhoids   CORONARY ARTERY BYPASS GRAFT N/A 03/25/2019   Procedure: CORONARY ARTERY BYPASS GRAFTING (CABG) X  4 USING LEFT INTERNAL MAMMARY ARTERY AND RIGHT SAPHENOUS VEIN GRAFTS;  Surgeon: [name], MD;  Location: [facility];  Service: Open Heart Surgery;  Laterality: N/A;   ESOPHAGOGASTRODUODENOSCOPY (EGD) WITH PROPOFOL N/A 10/21/2020   [name]:Normal  hypopharynx.Normal proximal esophagus and mid esophagus.Esophageal mucosal changes consistent with long-segment Barrett's esophagus. (Focal low-grade dysplasia and atypia proximally) z line irregular 37 cm from incisors, 3 cm HH, single gastric polyp (fundic gland) normal duodenal bulb/second portion of duodenum, proximal margin of Barrett's at 34 cm   EXCISION MORTON'S NEUROMA     EYE SURGERY Left    retina   POLYPECTOMY  06/27/2019   Procedure: POLYPECTOMY;  Surgeon: Rogene Houston, MD;  [facility];  Service: Endoscopy;;  proximal sigmoid colon   RIGHT/LEFT HEART CATH AND CORONARY ANGIOGRAPHY N/A 03/12/2019   Procedure: RIGHT/LEFT HEART CATH AND CORONARY ANGIOGRAPHY;  Surgeon: [name], MD;  Location: [facility] [facility];  Service: Cardiovascular;  Laterality: N/A;   SHOULDER ARTHROSCOPY WITH ROTATOR CUFF REPAIR AND SUBACROMIAL DECOMPRESSION Right 08/26/2020   Procedure: RIGHT SHOULDER MINI OPEN ROTATOR CUFF REPAIR AND SUBACROMIAL DECOMPRESSION WITH PATCH GRAFT;  Surgeon: Susa Day, MD;  Location: [facility];  Service: Orthopedics;  Laterality: Right;  90 MINS GENERAL WITH BLOCK   TEE WITHOUT CARDIOVERSION N/A 03/25/2019   Procedure: TRANSESOPHAGEAL ECHOCARDIOGRAM (TEE);  Surgeon: [name], MD;  Location: [facility];  Service: Open Heart Surgery;  Laterality: N/A;    Family History  Problem Relation Age of Onset   Colon cancer Mother    Heart Problems Father    Diabetes Father    Valvular heart disease Father    Social History:  reports that he quit smoking about 32 years ago. His smoking use included cigarettes. He has never used smokeless tobacco. He reports that  he does not currently use alcohol. He reports that he does not use drugs.  Allergies:  Allergies  Allergen Reactions   Bee Venom Anaphylaxis   Atorvastatin     myalgia   Diltiazem Itching   Rosuvastatin     myalgias   Valsartan Itching    Facility-Administered Medications Prior to  Admission  Medication Dose Route Frequency Provider Last Rate Last Admin   sodium chloride flush (NS) 0.9 % injection 3 mL  3 mL Intravenous Q12H Loel Dubonnet, NP       Medications Prior to Admission  Medication Sig Dispense Refill   acetaminophen (TYLENOL) 500 MG tablet Take 1,000 mg by mouth every 6 (six) hours as needed for moderate pain or headache.      albuterol (VENTOLIN HFA) 108 (90 Base) MCG/ACT inhaler Inhale 2 puffs into the lungs every 6 (six) hours as needed. (Patient taking differently: Inhale 2 puffs into the lungs every 6 (six) hours as needed for wheezing or shortness of breath.) 18 g 5   aspirin EC 81 MG tablet Take 325 mg by mouth daily. Swallow whole.     carvedilol (COREG) 12.5 MG tablet Take 1 tablet (12.5 mg total) by mouth 2 (two) times daily. 180 tablet 3   doxazosin (CARDURA) 4 MG tablet Take 4 mg by mouth daily.     EPINEPHrine 0.3 mg/0.3 mL IJ SOAJ injection Inject 0.3 mg into the muscle as needed for anaphylaxis. 1 each 4   Evolocumab (REPATHA SURECLICK) 540 MG/ML SOAJ Inject 140 mg into the skin every 14 (fourteen) days. 2 mL 11   furosemide (LASIX) 40 MG tablet Take 40 mg by mouth daily.     insulin glargine (LANTUS SOLOSTAR) 100 UNIT/ML Solostar Pen Inject 25 Units into the skin every morning. 5 pen PRN   isosorbide-hydrALAZINE (BIDIL) 20-37.5 MG tablet Take 1 tablet by mouth in the morning and at bedtime. 180 tablet 1   OZEMPIC, 1 MG/DOSE, 4 MG/3ML SOPN Inject 1 mg into the skin once a week.     pantoprazole (PROTONIX) 40 MG tablet Take 1 tablet (40 mg total) by mouth 2 (two) times daily before a meal. 60 tablet 5   sertraline (ZOLOFT) 100 MG tablet TAKE 1 TABLET BY MOUTH DAILY (Patient taking differently: Take 100 mg by mouth daily.) 90 tablet 0   valsartan (DIOVAN) 80 MG tablet Take 1 tablet (80 mg total) by mouth daily. 90 tablet 1   Insulin Pen Needle (B-D ULTRAFINE III SHORT PEN) 31G X 8 MM MISC 1 each by Does not apply route as directed. 100 each 3     Results for orders placed or performed during the hospital encounter of 02/24/21 (from the past 48 hour(s))  Glucose, capillary     Status: Abnormal   Collection Time: 02/24/21  1:38 PM  Result Value Ref Range   Glucose-Capillary 128 (H) 70 - 99 mg/dL    Comment: Glucose reference range applies only to samples taken after fasting for at least 8 hours.   No results found.  Review of Systems  Blood pressure (!) 185/104, pulse 79, temperature 98.6 F (37 C), temperature source Oral, resp. rate 16, SpO2 98 %. Physical Exam HENT:     Mouth/Throat:     Mouth: Mucous membranes are moist.     Pharynx: Oropharynx is clear.  Eyes:     General: No scleral icterus.    Conjunctiva/sclera: Conjunctivae normal.  Cardiovascular:     Rate and Rhythm: Normal rate and regular  rhythm.     Heart sounds: Normal heart sounds. No murmur heard. Pulmonary:     Effort: Pulmonary effort is normal.     Breath sounds: Normal breath sounds.  Abdominal:     General: There is no distension.     Palpations: Abdomen is soft. There is no mass.     Tenderness: There is no abdominal tenderness.  Musculoskeletal:        General: No swelling.     Cervical back: Neck supple.  Lymphadenopathy:     Cervical: No cervical adenopathy.  Skin:    General: Skin is warm.  Neurological:     Mental Status: He is alert.     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 Esophagogastroduodenoscopy with esophageal biopsy.  [name], MD 02/24/2021, 4:02 PM

## 2021-02-24 NOTE — Progress Notes (Signed)
Pt updated on procedural delay as well as family in lobby. PT verbalizes understanding

## 2021-02-24 NOTE — Op Note (Signed)
[facility] Patient Name: [name] Procedure Date: 02/24/2021 3:48 PM MRN: [id number] Date of Birth: 04-03-1956 Attending MD: [name] , MD CSN: [id number] Age: 65 Admit Type: Outpatient Procedure:                Upper GI endoscopy Indications:              Barrett's low grade dysplasia Providers:                [name], MD, Gwynneth Albright RN, RN,                            Crystal Page Referring MD:             [name]. [name], MD Medicines:                Propofol per Anesthesia Complications:            No immediate complications. Estimated Blood Loss:     Estimated blood loss was minimal. Procedure:                Pre-Anesthesia Assessment:                           - Prior to the procedure, a History and Physical                            was performed, and patient medications and                            allergies were reviewed. The patient's tolerance of                            previous anesthesia was also reviewed. The risks                            and benefits of the procedure and the sedation                            options and risks were discussed with the patient.                            All questions were answered, and informed consent                            was obtained. Prior Anticoagulants: The patient has                            taken no previous anticoagulant or antiplatelet                            agents except for aspirin. ASA Grade Assessment:                            III - A patient with severe systemic disease. After  reviewing the risks and benefits, the patient was                            deemed in satisfactory condition to undergo the                            procedure.                           After obtaining informed consent, the endoscope was                            passed under direct vision. Throughout the                            procedure, the patient's blood  pressure, pulse, and                            oxygen saturations were monitored continuously. The                            GIF-H190 ([id number]) scope was introduced through the                            mouth, and advanced to the second part of duodenum.                            The upper GI endoscopy was accomplished without                            difficulty. The patient tolerated the procedure                            well. Scope In: 4:13:17 PM Scope Out: 4:25:55 PM Total Procedure Duration: 0 hours 12 minutes 38 seconds  Findings:      The hypopharynx was normal.      The proximal esophagus and mid esophagus were normal.      There were esophageal mucosal changes secondary to established       long-segment Barrett's disease present in the distal esophagus. The       maximum longitudinal extent of these mucosal changes was 5 cm in length.       Mucosa was biopsied with a cold forceps for histology randomly at 35 and       36 cm from the incisors. One specimen bottle was sent to pathology.       Mucosa was biopsied with a cold forceps for histology randomly at 32 and       33 cm from the incisors. The pathology specimen was placed into Bottle       Number 2.      The Z-line was regular and was found 37 cm from the incisors.      A 3 cm hiatal hernia was present.      A few pedunculated and sessile polyps with no stigmata of recent       bleeding were found in the gastric fundus and in the gastric  body.      The exam of the stomach was otherwise normal.      The duodenal bulb and second portion of the duodenum were normal. Impression:               - Normal hypopharynx.                           - Normal proximal esophagus and mid esophagus.                           - Esophageal mucosal changes secondary to                            established long-segment Barrett's disease.                            Biopsied. Proximal margin at 32 cm and distal                             margin at 37 cm from incisors.                           - Z-line regular, 37 cm from the incisors.                           - 3 cm hiatal hernia.                           - A few gastric polyps.                           - Normal duodenal bulb and second portion of the                            duodenum. Moderate Sedation:      Per Anesthesia Care Recommendation:           - Patient has a contact number available for                            emergencies. The signs and symptoms of potential                            delayed complications were discussed with the                            patient. Return to normal activities tomorrow.                            Written discharge instructions were provided to the                            patient.                           - Resume previous diet today.                           -  Continue present medications.                           - Resume Aspirin on 02/24/2021..                           - Await pathology results.                           - Repeat upper endoscopy for surveillance based on                            pathology results. Procedure Code(s):        --- Professional ---                           [phone], Esophagogastroduodenoscopy, flexible,                            transoral; with biopsy, single or multiple Diagnosis Code(s):        --- Professional ---                           K44.9, Diaphragmatic hernia without obstruction or                            gangrene                           K31.7, Polyp of stomach and duodenum                           K22.710, Barrett's esophagus with low grade                            dysplasia CPT copyright 2019 American Medical Association. All rights reserved. The codes documented in this report are preliminary and upon coder review may  be revised to meet current compliance requirements. [name], MD [name], MD 02/24/2021 4:36:34 PM This report has been  signed electronically. Number of Addenda: 0

## 2021-02-24 NOTE — Anesthesia Postprocedure Evaluation (Signed)
Anesthesia Post Note  Patient: [name]  Procedure(s) Performed: ESOPHAGOGASTRODUODENOSCOPY (EGD) WITH PROPOFOL BIOPSY  Patient location during evaluation: Phase II Anesthesia Type: General Level of consciousness: awake and alert and oriented Pain management: pain level controlled Vital Signs Assessment: post-procedure vital signs reviewed and stable Respiratory status: spontaneous breathing and respiratory function stable Cardiovascular status: blood pressure returned to baseline and stable Postop Assessment: no apparent nausea or vomiting Anesthetic complications: no   No notable events documented.   Last Vitals:  Vitals:   02/24/21 1334 02/24/21 1633  BP: (!) 185/104 103/63  Pulse: 79 82  Resp: 16 18  Temp: 37 C 36.6 C  SpO2: 98% 96%    Last Pain:  Vitals:   02/24/21 1633  TempSrc: Oral  PainSc: 0-No pain                 Geeta Dworkin C Ell Tiso

## 2021-02-24 NOTE — Telephone Encounter (Signed)
Was filled out please see form

## 2021-02-25 DIAGNOSIS — Z8673 Personal history of transient ischemic attack (TIA), and cerebral infarction without residual deficits: Secondary | ICD-10-CM | POA: Diagnosis not present

## 2021-02-25 DIAGNOSIS — Z794 Long term (current) use of insulin: Secondary | ICD-10-CM | POA: Diagnosis not present

## 2021-02-25 DIAGNOSIS — E113511 Type 2 diabetes mellitus with proliferative diabetic retinopathy with macular edema, right eye: Secondary | ICD-10-CM | POA: Diagnosis not present

## 2021-02-25 DIAGNOSIS — Z79899 Other long term (current) drug therapy: Secondary | ICD-10-CM | POA: Diagnosis not present

## 2021-02-25 DIAGNOSIS — E113593 Type 2 diabetes mellitus with proliferative diabetic retinopathy without macular edema, bilateral: Secondary | ICD-10-CM | POA: Diagnosis not present

## 2021-02-25 DIAGNOSIS — E113592 Type 2 diabetes mellitus with proliferative diabetic retinopathy without macular edema, left eye: Secondary | ICD-10-CM | POA: Diagnosis not present

## 2021-02-25 DIAGNOSIS — H4313 Vitreous hemorrhage, bilateral: Secondary | ICD-10-CM | POA: Diagnosis not present

## 2021-02-25 MED ORDER — FUROSEMIDE 40 MG PO TABS: ORAL_TABLET | ORAL | 1 refills | Status: DC

## 2021-02-25 MED ORDER — VALSARTAN 80 MG PO TABS: 80.0000 mg | ORAL_TABLET | Freq: Every day | ORAL | 1 refills | Status: DC

## 2021-02-25 NOTE — Telephone Encounter (Signed)
Medication sent to [facility]. Faxed from to [facility] and also placed in scan box.

## 2021-03-01 LAB — SURGICAL PATHOLOGY

## 2021-03-02 ENCOUNTER — Encounter (HOSPITAL_COMMUNITY): Payer: Self-pay | Admitting: Internal Medicine

## 2021-03-03 DIAGNOSIS — E1122 Type 2 diabetes mellitus with diabetic chronic kidney disease: Secondary | ICD-10-CM | POA: Diagnosis not present

## 2021-03-03 DIAGNOSIS — I129 Hypertensive chronic kidney disease with stage 1 through stage 4 chronic kidney disease, or unspecified chronic kidney disease: Secondary | ICD-10-CM | POA: Diagnosis not present

## 2021-03-03 DIAGNOSIS — R5383 Other fatigue: Secondary | ICD-10-CM | POA: Diagnosis not present

## 2021-03-03 DIAGNOSIS — I5020 Unspecified systolic (congestive) heart failure: Secondary | ICD-10-CM | POA: Diagnosis not present

## 2021-03-03 DIAGNOSIS — N1832 Chronic kidney disease, stage 3b: Secondary | ICD-10-CM | POA: Diagnosis not present

## 2021-03-05 DIAGNOSIS — Z794 Long term (current) use of insulin: Secondary | ICD-10-CM | POA: Diagnosis not present

## 2021-03-05 DIAGNOSIS — E119 Type 2 diabetes mellitus without complications: Secondary | ICD-10-CM | POA: Diagnosis not present

## 2021-03-10 DIAGNOSIS — K22719 Barrett's esophagus with dysplasia, unspecified: Secondary | ICD-10-CM | POA: Diagnosis not present

## 2021-03-12 ENCOUNTER — Other Ambulatory Visit: Payer: Self-pay

## 2021-03-12 ENCOUNTER — Telehealth: Payer: Self-pay | Admitting: Cardiovascular Disease

## 2021-03-12 DIAGNOSIS — E785 Hyperlipidemia, unspecified: Secondary | ICD-10-CM | POA: Diagnosis not present

## 2021-03-12 DIAGNOSIS — D001 Carcinoma in situ of esophagus: Secondary | ICD-10-CM | POA: Diagnosis not present

## 2021-03-12 DIAGNOSIS — Z888 Allergy status to other drugs, medicaments and biological substances status: Secondary | ICD-10-CM | POA: Diagnosis not present

## 2021-03-12 DIAGNOSIS — K449 Diaphragmatic hernia without obstruction or gangrene: Secondary | ICD-10-CM | POA: Diagnosis not present

## 2021-03-12 DIAGNOSIS — I110 Hypertensive heart disease with heart failure: Secondary | ICD-10-CM | POA: Diagnosis not present

## 2021-03-12 DIAGNOSIS — Z91030 Bee allergy status: Secondary | ICD-10-CM | POA: Diagnosis not present

## 2021-03-12 DIAGNOSIS — Z79899 Other long term (current) drug therapy: Secondary | ICD-10-CM | POA: Diagnosis not present

## 2021-03-12 DIAGNOSIS — G4730 Sleep apnea, unspecified: Secondary | ICD-10-CM | POA: Diagnosis not present

## 2021-03-12 DIAGNOSIS — C159 Malignant neoplasm of esophagus, unspecified: Secondary | ICD-10-CM | POA: Diagnosis not present

## 2021-03-12 DIAGNOSIS — K2270 Barrett's esophagus without dysplasia: Secondary | ICD-10-CM | POA: Diagnosis not present

## 2021-03-12 DIAGNOSIS — I252 Old myocardial infarction: Secondary | ICD-10-CM | POA: Diagnosis not present

## 2021-03-12 DIAGNOSIS — E1121 Type 2 diabetes mellitus with diabetic nephropathy: Secondary | ICD-10-CM | POA: Diagnosis not present

## 2021-03-12 DIAGNOSIS — C160 Malignant neoplasm of cardia: Secondary | ICD-10-CM | POA: Diagnosis not present

## 2021-03-12 DIAGNOSIS — I2510 Atherosclerotic heart disease of native coronary artery without angina pectoris: Secondary | ICD-10-CM | POA: Diagnosis not present

## 2021-03-12 DIAGNOSIS — I509 Heart failure, unspecified: Secondary | ICD-10-CM | POA: Diagnosis not present

## 2021-03-12 DIAGNOSIS — Z7982 Long term (current) use of aspirin: Secondary | ICD-10-CM | POA: Diagnosis not present

## 2021-03-12 DIAGNOSIS — Z8673 Personal history of transient ischemic attack (TIA), and cerebral infarction without residual deficits: Secondary | ICD-10-CM | POA: Diagnosis not present

## 2021-03-12 MED ORDER — REPATHA SURECLICK 140 MG/ML ~~LOC~~ SOAJ: 140.0000 mg | SUBCUTANEOUS | 11 refills | Status: DC

## 2021-03-12 NOTE — Telephone Encounter (Signed)
*  STAT* If patient is at the pharmacy, call can be transferred to refill team.   1. Which medications need to be refilled? (please list name of each medication and dose if known) Evolocumab (REPATHA SURECLICK) 830 MG/ML SOAJ  2. Which pharmacy/location (including street and city if local pharmacy) is medication to be sent to? [place]  3. Do they need a 30 day or 90 day supply? 90 day  Patient is out of medication.

## 2021-03-15 ENCOUNTER — Telehealth (HOSPITAL_BASED_OUTPATIENT_CLINIC_OR_DEPARTMENT_OTHER): Payer: Self-pay | Admitting: Cardiovascular Disease

## 2021-03-15 MED ORDER — REPATHA SURECLICK 140 MG/ML ~~LOC~~ SOAJ: 140.0000 mg | SUBCUTANEOUS | 3 refills | Status: DC

## 2021-03-15 NOTE — Telephone Encounter (Signed)
Rx request sent to pharmacy.  

## 2021-03-15 NOTE — Addendum Note (Signed)
Addended by: Allean Found on: 03/15/2021 09:28 AM   Modules accepted: Orders

## 2021-03-15 NOTE — Telephone Encounter (Signed)
*  STAT* If patient is at the pharmacy, call can be transferred to refill team.   1. Which medications need to be refilled? (please list name of each medication and dose if known) Evolocumab (REPATHA SURECLICK) 062 MG/ML SOAJ  2. Which pharmacy/location (including street and city if local pharmacy) is medication to be sent to? [place]  3. Do they need a 30 day or 90 day supply? [place]

## 2021-03-17 NOTE — Telephone Encounter (Signed)
Called and lmom pt to bring insurance card to scan in

## 2021-03-17 NOTE — Telephone Encounter (Signed)
  Pt said his pharmacy needs prior auth for repatha

## 2021-03-18 DIAGNOSIS — Z9841 Cataract extraction status, right eye: Secondary | ICD-10-CM | POA: Diagnosis not present

## 2021-03-18 DIAGNOSIS — E113511 Type 2 diabetes mellitus with proliferative diabetic retinopathy with macular edema, right eye: Secondary | ICD-10-CM | POA: Diagnosis not present

## 2021-03-18 DIAGNOSIS — Z961 Presence of intraocular lens: Secondary | ICD-10-CM | POA: Diagnosis not present

## 2021-03-18 DIAGNOSIS — Z9842 Cataract extraction status, left eye: Secondary | ICD-10-CM | POA: Diagnosis not present

## 2021-03-18 DIAGNOSIS — E113592 Type 2 diabetes mellitus with proliferative diabetic retinopathy without macular edema, left eye: Secondary | ICD-10-CM | POA: Diagnosis not present

## 2021-03-20 ENCOUNTER — Encounter (HOSPITAL_COMMUNITY): Payer: Self-pay | Admitting: Emergency Medicine

## 2021-03-20 ENCOUNTER — Other Ambulatory Visit: Payer: Self-pay

## 2021-03-20 ENCOUNTER — Inpatient Hospital Stay (HOSPITAL_COMMUNITY)
Admission: EM | Admit: 2021-03-20 | Discharge: 2021-03-23 | DRG: 378 | Disposition: A | Discharge status: Home / Self Care | Payer: Medicare HMO | Attending: Internal Medicine | Admitting: Internal Medicine

## 2021-03-20 ENCOUNTER — Emergency Department (HOSPITAL_COMMUNITY): Payer: Medicare HMO

## 2021-03-20 DIAGNOSIS — Z8249 Family history of ischemic heart disease and other diseases of the circulatory system: Secondary | ICD-10-CM

## 2021-03-20 DIAGNOSIS — I10 Essential (primary) hypertension: Secondary | ICD-10-CM | POA: Diagnosis present

## 2021-03-20 DIAGNOSIS — K5900 Constipation, unspecified: Secondary | ICD-10-CM | POA: Diagnosis not present

## 2021-03-20 DIAGNOSIS — E1159 Type 2 diabetes mellitus with other circulatory complications: Secondary | ICD-10-CM | POA: Diagnosis present

## 2021-03-20 DIAGNOSIS — E872 Acidosis, unspecified: Secondary | ICD-10-CM | POA: Diagnosis not present

## 2021-03-20 DIAGNOSIS — D509 Iron deficiency anemia, unspecified: Secondary | ICD-10-CM | POA: Diagnosis not present

## 2021-03-20 DIAGNOSIS — N185 Chronic kidney disease, stage 5: Secondary | ICD-10-CM | POA: Diagnosis present

## 2021-03-20 DIAGNOSIS — K31811 Angiodysplasia of stomach and duodenum with bleeding: Secondary | ICD-10-CM | POA: Diagnosis not present

## 2021-03-20 DIAGNOSIS — R7989 Other specified abnormal findings of blood chemistry: Secondary | ICD-10-CM | POA: Diagnosis present

## 2021-03-20 DIAGNOSIS — I459 Conduction disorder, unspecified: Secondary | ICD-10-CM | POA: Diagnosis present

## 2021-03-20 DIAGNOSIS — D72829 Elevated white blood cell count, unspecified: Secondary | ICD-10-CM | POA: Diagnosis not present

## 2021-03-20 DIAGNOSIS — Z20822 Contact with and (suspected) exposure to covid-19: Secondary | ICD-10-CM | POA: Diagnosis present

## 2021-03-20 DIAGNOSIS — Z888 Allergy status to other drugs, medicaments and biological substances status: Secondary | ICD-10-CM

## 2021-03-20 DIAGNOSIS — Z794 Long term (current) use of insulin: Secondary | ICD-10-CM

## 2021-03-20 DIAGNOSIS — Z951 Presence of aortocoronary bypass graft: Secondary | ICD-10-CM

## 2021-03-20 DIAGNOSIS — M1990 Unspecified osteoarthritis, unspecified site: Secondary | ICD-10-CM | POA: Diagnosis present

## 2021-03-20 DIAGNOSIS — N184 Chronic kidney disease, stage 4 (severe): Secondary | ICD-10-CM

## 2021-03-20 DIAGNOSIS — Z87891 Personal history of nicotine dependence: Secondary | ICD-10-CM

## 2021-03-20 DIAGNOSIS — N179 Acute kidney failure, unspecified: Secondary | ICD-10-CM | POA: Diagnosis present

## 2021-03-20 DIAGNOSIS — G4733 Obstructive sleep apnea (adult) (pediatric): Secondary | ICD-10-CM | POA: Diagnosis present

## 2021-03-20 DIAGNOSIS — I5042 Chronic combined systolic (congestive) and diastolic (congestive) heart failure: Secondary | ICD-10-CM | POA: Diagnosis present

## 2021-03-20 DIAGNOSIS — C159 Malignant neoplasm of esophagus, unspecified: Secondary | ICD-10-CM | POA: Diagnosis not present

## 2021-03-20 DIAGNOSIS — I252 Old myocardial infarction: Secondary | ICD-10-CM

## 2021-03-20 DIAGNOSIS — H5461 Unqualified visual loss, right eye, normal vision left eye: Secondary | ICD-10-CM | POA: Diagnosis present

## 2021-03-20 DIAGNOSIS — D62 Acute posthemorrhagic anemia: Secondary | ICD-10-CM

## 2021-03-20 DIAGNOSIS — E876 Hypokalemia: Secondary | ICD-10-CM | POA: Diagnosis present

## 2021-03-20 DIAGNOSIS — Z91030 Bee allergy status: Secondary | ICD-10-CM

## 2021-03-20 DIAGNOSIS — K449 Diaphragmatic hernia without obstruction or gangrene: Secondary | ICD-10-CM | POA: Diagnosis present

## 2021-03-20 DIAGNOSIS — K922 Gastrointestinal hemorrhage, unspecified: Secondary | ICD-10-CM | POA: Diagnosis present

## 2021-03-20 DIAGNOSIS — Z833 Family history of diabetes mellitus: Secondary | ICD-10-CM

## 2021-03-20 DIAGNOSIS — K22711 Barrett's esophagus with high grade dysplasia: Secondary | ICD-10-CM | POA: Diagnosis not present

## 2021-03-20 DIAGNOSIS — I2510 Atherosclerotic heart disease of native coronary artery without angina pectoris: Secondary | ICD-10-CM | POA: Diagnosis present

## 2021-03-20 DIAGNOSIS — E669 Obesity, unspecified: Secondary | ICD-10-CM | POA: Diagnosis present

## 2021-03-20 DIAGNOSIS — Z800 Family history of malignant neoplasm of digestive organs: Secondary | ICD-10-CM

## 2021-03-20 DIAGNOSIS — E782 Mixed hyperlipidemia: Secondary | ICD-10-CM | POA: Diagnosis present

## 2021-03-20 DIAGNOSIS — R1110 Vomiting, unspecified: Secondary | ICD-10-CM | POA: Diagnosis not present

## 2021-03-20 DIAGNOSIS — I132 Hypertensive heart and chronic kidney disease with heart failure and with stage 5 chronic kidney disease, or end stage renal disease: Secondary | ICD-10-CM | POA: Diagnosis present

## 2021-03-20 DIAGNOSIS — I959 Hypotension, unspecified: Secondary | ICD-10-CM | POA: Diagnosis present

## 2021-03-20 DIAGNOSIS — E1122 Type 2 diabetes mellitus with diabetic chronic kidney disease: Secondary | ICD-10-CM | POA: Diagnosis present

## 2021-03-20 DIAGNOSIS — E119 Type 2 diabetes mellitus without complications: Secondary | ICD-10-CM | POA: Diagnosis present

## 2021-03-20 DIAGNOSIS — K920 Hematemesis: Secondary | ICD-10-CM | POA: Diagnosis not present

## 2021-03-20 DIAGNOSIS — Z79899 Other long term (current) drug therapy: Secondary | ICD-10-CM

## 2021-03-20 DIAGNOSIS — Z7982 Long term (current) use of aspirin: Secondary | ICD-10-CM

## 2021-03-20 DIAGNOSIS — Z6831 Body mass index (BMI) 31.0-31.9, adult: Secondary | ICD-10-CM

## 2021-03-20 DIAGNOSIS — K219 Gastro-esophageal reflux disease without esophagitis: Secondary | ICD-10-CM | POA: Diagnosis present

## 2021-03-20 HISTORY — DX: Acute posthemorrhagic anemia: D62

## 2021-03-20 LAB — COMPREHENSIVE METABOLIC PANEL
ALT: 9 U/L (ref 0–44)
ALT: 8 U/L (ref 0–44)
AST: 10 U/L — ABNORMAL LOW (ref 15–41)
AST: 9 U/L — ABNORMAL LOW (ref 15–41)
Albumin: 3.3 g/dL — ABNORMAL LOW (ref 3.5–5.0)
Albumin: 3.3 g/dL — ABNORMAL LOW (ref 3.5–5.0)
Alkaline Phosphatase: 54 U/L (ref 38–126)
Alkaline Phosphatase: 50 U/L (ref 38–126)
Anion gap: 14 (ref 5–15)
Anion gap: 10 (ref 5–15)
BUN: 91 mg/dL — ABNORMAL HIGH (ref 8–23)
BUN: 98 mg/dL — ABNORMAL HIGH (ref 8–23)
CO2: 15 mmol/L — ABNORMAL LOW (ref 22–32)
CO2: 18 mmol/L — ABNORMAL LOW (ref 22–32)
Calcium: 8.7 mg/dL — ABNORMAL LOW (ref 8.9–10.3)
Calcium: 8.8 mg/dL — ABNORMAL LOW (ref 8.9–10.3)
Chloride: 106 mmol/L (ref 98–111)
Chloride: 109 mmol/L (ref 98–111)
Creatinine, Ser: 3.83 mg/dL — ABNORMAL HIGH (ref 0.61–1.24)
Creatinine, Ser: 3.72 mg/dL — ABNORMAL HIGH (ref 0.61–1.24)
GFR, Estimated: 17 mL/min — ABNORMAL LOW (ref >60)
GFR, Estimated: 17 mL/min — ABNORMAL LOW (ref >60)
Glucose, Bld: 337 mg/dL — ABNORMAL HIGH (ref 70–99)
Glucose, Bld: 176 mg/dL — ABNORMAL HIGH (ref 70–99)
Potassium: 4.4 mmol/L (ref 3.5–5.1)
Potassium: 4.2 mmol/L (ref 3.5–5.1)
Sodium: 135 mmol/L (ref 135–145)
Sodium: 137 mmol/L (ref 135–145)
Total Bilirubin: 0.4 mg/dL (ref 0.3–1.2)
Total Bilirubin: 0.9 mg/dL (ref 0.3–1.2)
Total Protein: 5.7 g/dL — ABNORMAL LOW (ref 6.5–8.1)
Total Protein: 5.8 g/dL — ABNORMAL LOW (ref 6.5–8.1)

## 2021-03-20 LAB — LIPASE, BLOOD: Lipase: 59 U/L — ABNORMAL HIGH (ref 11–51)

## 2021-03-20 LAB — RESP PANEL BY RT-PCR (FLU A&B, COVID) ARPGX2
Influenza A by PCR: NEGATIVE
Influenza B by PCR: NEGATIVE
SARS Coronavirus 2 by RT PCR: NEGATIVE

## 2021-03-20 LAB — CBC WITH DIFFERENTIAL/PLATELET
Abs Immature Granulocytes: 0.06 10*3/uL (ref 0.00–0.07)
Basophils Absolute: 0.1 10*3/uL (ref 0.0–0.1)
Basophils Relative: 1 %
Eosinophils Absolute: 0.2 10*3/uL (ref 0.0–0.5)
Eosinophils Relative: 1 %
HCT: 24.6 % — ABNORMAL LOW (ref 39.0–52.0)
Hemoglobin: 7.9 g/dL — ABNORMAL LOW (ref 13.0–17.0)
Immature Granulocytes: 1 %
Lymphocytes Relative: 17 %
Lymphs Abs: 2.1 10*3/uL (ref 0.7–4.0)
MCH: 28.5 pg (ref 26.0–34.0)
MCHC: 32.1 g/dL (ref 30.0–36.0)
MCV: 88.8 fL (ref 80.0–100.0)
Monocytes Absolute: 0.8 10*3/uL (ref 0.1–1.0)
Monocytes Relative: 7 %
Neutro Abs: 8.8 10*3/uL — ABNORMAL HIGH (ref 1.7–7.7)
Neutrophils Relative %: 73 %
Platelets: 288 10*3/uL (ref 150–400)
RBC: 2.77 MIL/uL — ABNORMAL LOW (ref 4.22–5.81)
RDW: 14.2 % (ref 11.5–15.5)
WBC: 12 10*3/uL — ABNORMAL HIGH (ref 4.0–10.5)
nRBC: 0 % (ref 0.0–0.2)

## 2021-03-20 LAB — CBC
HCT: 25 % — ABNORMAL LOW (ref 39.0–52.0)
HCT: 22 % — ABNORMAL LOW (ref 39.0–52.0)
Hemoglobin: 8.2 g/dL — ABNORMAL LOW (ref 13.0–17.0)
Hemoglobin: 7.1 g/dL — ABNORMAL LOW (ref 13.0–17.0)
MCH: 28 pg (ref 26.0–34.0)
MCH: 27.6 pg (ref 26.0–34.0)
MCHC: 32.8 g/dL (ref 30.0–36.0)
MCHC: 32.3 g/dL (ref 30.0–36.0)
MCV: 85.3 fL (ref 80.0–100.0)
MCV: 85.6 fL (ref 80.0–100.0)
Platelets: 230 10*3/uL (ref 150–400)
Platelets: 225 10*3/uL (ref 150–400)
RBC: 2.93 MIL/uL — ABNORMAL LOW (ref 4.22–5.81)
RBC: 2.57 MIL/uL — ABNORMAL LOW (ref 4.22–5.81)
RDW: 15.9 % — ABNORMAL HIGH (ref 11.5–15.5)
RDW: 16.3 % — ABNORMAL HIGH (ref 11.5–15.5)
WBC: 9.6 10*3/uL (ref 4.0–10.5)
WBC: 10.2 10*3/uL (ref 4.0–10.5)
nRBC: 0 % (ref 0.0–0.2)
nRBC: 0 % (ref 0.0–0.2)

## 2021-03-20 LAB — I-STAT CHEM 8, ED
BUN: 110 mg/dL — ABNORMAL HIGH (ref 8–23)
Calcium, Ion: 1.16 mmol/L (ref 1.15–1.40)
Chloride: 108 mmol/L (ref 98–111)
Creatinine, Ser: 4.1 mg/dL — ABNORMAL HIGH (ref 0.61–1.24)
Glucose, Bld: 308 mg/dL — ABNORMAL HIGH (ref 70–99)
HCT: 21 % — ABNORMAL LOW (ref 39.0–52.0)
Hemoglobin: 7.1 g/dL — ABNORMAL LOW (ref 13.0–17.0)
Potassium: 4.3 mmol/L (ref 3.5–5.1)
Sodium: 137 mmol/L (ref 135–145)
TCO2: 18 mmol/L — ABNORMAL LOW (ref 22–32)

## 2021-03-20 LAB — IRON AND TIBC
Iron: 157 ug/dL (ref 45–182)
Saturation Ratios: 67 % — ABNORMAL HIGH (ref 17.9–39.5)
TIBC: 234 ug/dL — ABNORMAL LOW (ref 250–450)
UIBC: 77 ug/dL

## 2021-03-20 LAB — URINALYSIS, COMPLETE (UACMP) WITH MICROSCOPIC
Bacteria, UA: NONE SEEN
Bilirubin Urine: NEGATIVE
Glucose, UA: 50 mg/dL — AB
Hgb urine dipstick: NEGATIVE
Ketones, ur: NEGATIVE mg/dL
Leukocytes,Ua: NEGATIVE
Nitrite: NEGATIVE
Protein, ur: 100 mg/dL — AB
Specific Gravity, Urine: 1.012 (ref 1.005–1.030)
pH: 5 (ref 5.0–8.0)

## 2021-03-20 LAB — RETICULOCYTES
Immature Retic Fract: 15.9 % (ref 2.3–15.9)
RBC.: 2.99 MIL/uL — ABNORMAL LOW (ref 4.22–5.81)
Retic Count, Absolute: 50.2 10*3/uL (ref 19.0–186.0)
Retic Ct Pct: 1.7 % (ref 0.4–3.1)

## 2021-03-20 LAB — PREPARE RBC (CROSSMATCH)

## 2021-03-20 LAB — HIV ANTIBODY (ROUTINE TESTING W REFLEX): HIV Screen 4th Generation wRfx: NONREACTIVE

## 2021-03-20 LAB — HEMOGLOBIN AND HEMATOCRIT, BLOOD
HCT: 24.6 % — ABNORMAL LOW (ref 39.0–52.0)
HCT: 26 % — ABNORMAL LOW (ref 39.0–52.0)
Hemoglobin: 8.2 g/dL — ABNORMAL LOW (ref 13.0–17.0)
Hemoglobin: 8.5 g/dL — ABNORMAL LOW (ref 13.0–17.0)

## 2021-03-20 LAB — PROTIME-INR
INR: 1.1 (ref 0.8–1.2)
INR: 1.1 (ref 0.8–1.2)
Prothrombin Time: 14 seconds (ref 11.4–15.2)
Prothrombin Time: 14.4 seconds (ref 11.4–15.2)

## 2021-03-20 LAB — CREATININE, URINE, RANDOM: Creatinine, Urine: 49.97 mg/dL

## 2021-03-20 LAB — CBG MONITORING, ED
Glucose-Capillary: 196 mg/dL — ABNORMAL HIGH (ref 70–99)
Glucose-Capillary: 116 mg/dL — ABNORMAL HIGH (ref 70–99)
Glucose-Capillary: 128 mg/dL — ABNORMAL HIGH (ref 70–99)

## 2021-03-20 LAB — LACTIC ACID, PLASMA
Lactic Acid, Venous: 2.5 mmol/L (ref 0.5–1.9)
Lactic Acid, Venous: 1 mmol/L (ref 0.5–1.9)

## 2021-03-20 LAB — MAGNESIUM: Magnesium: 2.3 mg/dL (ref 1.7–2.4)

## 2021-03-20 LAB — FERRITIN: Ferritin: 336 ng/mL (ref 24–336)

## 2021-03-20 LAB — FOLATE: Folate: 7.2 ng/mL (ref >5.9)

## 2021-03-20 LAB — SODIUM, URINE, RANDOM: Sodium, Ur: 78 mmol/L

## 2021-03-20 MED ORDER — ISOSORB DINITRATE-HYDRALAZINE 20-37.5 MG PO TABS
1.0000 | ORAL_TABLET | Freq: Three times a day (TID) | ORAL | Status: DC
  Administered 2021-03-20 – 2021-03-23 (×9): 1 via ORAL
  Filled 2021-03-20 (×13): qty 1

## 2021-03-20 MED ORDER — FUROSEMIDE 10 MG/ML IJ SOLN
20.0000 mg | Freq: Once | INTRAMUSCULAR | Status: AC
  Administered 2021-03-20: 20 mg via INTRAVENOUS
  Filled 2021-03-20: qty 2

## 2021-03-20 MED ORDER — ACETAMINOPHEN 650 MG RE SUPP: 650.0000 mg | Freq: Four times a day (QID) | RECTAL | Status: DC | PRN

## 2021-03-20 MED ORDER — PANTOPRAZOLE INFUSION (NEW) - SIMPLE MED
8.0000 mg/h | INTRAVENOUS | Status: AC
  Administered 2021-03-20 – 2021-03-22 (×7): 8 mg/h via INTRAVENOUS
  Filled 2021-03-20: qty 80
  Filled 2021-03-20: qty 100
  Filled 2021-03-20 (×3): qty 80
  Filled 2021-03-20: qty 100
  Filled 2021-03-20 (×3): qty 80

## 2021-03-20 MED ORDER — SODIUM CHLORIDE 0.9 % IV SOLN: INTRAVENOUS | Status: DC

## 2021-03-20 MED ORDER — SODIUM CHLORIDE 0.9 % IV BOLUS
1000.0000 mL | Freq: Once | INTRAVENOUS | Status: AC
  Administered 2021-03-20: 1000 mL via INTRAVENOUS

## 2021-03-20 MED ORDER — SODIUM CHLORIDE 0.9% IV SOLUTION
Freq: Once | INTRAVENOUS | Status: DC
Start: 2021-03-20 — End: 2021-03-23

## 2021-03-20 MED ORDER — CARVEDILOL 6.25 MG PO TABS
6.2500 mg | ORAL_TABLET | Freq: Two times a day (BID) | ORAL | Status: DC
  Administered 2021-03-20 – 2021-03-23 (×7): 6.25 mg via ORAL
  Filled 2021-03-20: qty 1
  Filled 2021-03-20: qty 2
  Filled 2021-03-20 (×5): qty 1

## 2021-03-20 MED ORDER — ONDANSETRON HCL 4 MG/2ML IJ SOLN
4.0000 mg | Freq: Once | INTRAMUSCULAR | Status: AC
  Administered 2021-03-20: 4 mg via INTRAVENOUS
  Filled 2021-03-20: qty 2

## 2021-03-20 MED ORDER — SODIUM CHLORIDE 0.9% IV SOLUTION: Freq: Once | INTRAVENOUS | Status: DC

## 2021-03-20 MED ORDER — SODIUM CHLORIDE 0.9 % IV BOLUS: 1000.0000 mL | Freq: Once | INTRAVENOUS | Status: DC

## 2021-03-20 MED ORDER — ACETAMINOPHEN 325 MG PO TABS: 650.0000 mg | ORAL_TABLET | Freq: Four times a day (QID) | ORAL | Status: DC | PRN

## 2021-03-20 MED ORDER — FUROSEMIDE 10 MG/ML IJ SOLN: 20.0000 mg | Freq: Every day | INTRAMUSCULAR | Status: DC

## 2021-03-20 MED ORDER — ONDANSETRON HCL 4 MG/2ML IJ SOLN
4.0000 mg | Freq: Four times a day (QID) | INTRAMUSCULAR | Status: DC | PRN
  Administered 2021-03-21 – 2021-03-22 (×2): 4 mg via INTRAVENOUS
  Filled 2021-03-20 (×2): qty 2

## 2021-03-20 MED ORDER — SODIUM CHLORIDE 0.9 % IV SOLN: 10.0000 mL/h | Freq: Once | INTRAVENOUS | Status: DC

## 2021-03-20 MED ORDER — FUROSEMIDE 20 MG PO TABS
20.0000 mg | ORAL_TABLET | Freq: Every day | ORAL | Status: DC
  Administered 2021-03-20: 20 mg via ORAL
  Filled 2021-03-20: qty 1

## 2021-03-20 MED ORDER — INSULIN ASPART 100 UNIT/ML IJ SOLN
0.0000 [IU] | Freq: Four times a day (QID) | INTRAMUSCULAR | Status: DC
  Administered 2021-03-20: 2 [IU] via SUBCUTANEOUS
  Administered 2021-03-20 – 2021-03-21 (×2): 1 [IU] via SUBCUTANEOUS
  Administered 2021-03-21 – 2021-03-22 (×3): 2 [IU] via SUBCUTANEOUS
  Administered 2021-03-22: 5 [IU] via SUBCUTANEOUS
  Administered 2021-03-22: 1 [IU] via SUBCUTANEOUS
  Administered 2021-03-22 (×2): 2 [IU] via SUBCUTANEOUS
  Administered 2021-03-23: 1 [IU] via SUBCUTANEOUS

## 2021-03-20 MED ORDER — PANTOPRAZOLE SODIUM 40 MG IV SOLR
40.0000 mg | Freq: Once | INTRAVENOUS | Status: AC
  Administered 2021-03-20: 40 mg via INTRAVENOUS
  Filled 2021-03-20: qty 40

## 2021-03-20 NOTE — H&P (Signed)
History and Physical    PLEASE NOTE THAT DRAGON DICTATION SOFTWARE WAS USED IN THE CONSTRUCTION OF THIS NOTE.   [name] JME:[id number] DOB: Jun 18, 1955 DOA: 03/20/2021  PCP: [name], MD Patient coming from: home   I have personally briefly reviewed patient's old medical records in [place]  Chief Complaint: Hematemesis  HPI: [name] is a 65 y.o. male with medical history significant for recently diagnosed esophageal adenocarcinoma, chronic combined systolic/diastolic heart failure, essential hypertension, type 2 diabetes mellitus, stage IV chronic kidney disease with baseline creatinine 3.0-3.6.  Who is admitted to [facility] on 03/20/2021 with acute upper GI bleed after presenting from home to [facility] ED complaining of hematemesis.   In the setting of recently diagnosed esophageal adenocarcinoma with the patient recently underwent EGD at [facility] on 03/12/2021.  Following this EGD, he reports that he had been completely asymptomatic until development of nausea with 3-4 episodes of dark-colored emesis starting around 2100 on 03/19/2021.  He reports his most recent episode of emesis occurred while still at home, and denies any associated bright red blood associated with the emesis.  He also notes 1 episode of dark-colored stool at home earlier this evening, which was new for him.  Denies any associated abdominal discomfort.  Reports that his nausea has subsequently improved, particularly following a dose of IV Zofran in the ED this evening, and denies any current residual nausea.  No recent diarrhea.  No preceding trauma.  denies any associated SOB, CP, palpitations, diaphoresis, dizziness, presyncope, or syncope. Denies any recent travel or any associated subjective fever, chills, rigors, generalized myalgias, rash. Not associated with dysuria, gross hematuria, or change urinary urgency/frequency. Denies any associated cough or hemoptysis.   The patient denies any  regular or recent consumption of alcohol, and denies any known history of underlying chronic liver disease.  He reports that he is on a daily full dose aspirin with most recent dose occurring on 03/19/2021, but otherwise denies any use of additional blood thinners at home. no regular or recent use of NSAIDs.  Does not appear to be on any PPI as an outpatient.  Per chart review, it appears that patient's baseline hemoglobin is 10.5-12, with most recent prior hemoglobin noted to be 11.6 on 02/18/2021.   Medical history notable for chronic combined systolic/diastolic heart failure, with most recent echocardiogram on 02/04/2021 notable for moderate to severely dilated internal cavity of the left ventricle, LVEF 45 to 22%, grade 1 diastolic dysfunction, severely dilated left atrium, mildly dilated right atrium, and mild mitral regurgitation.      ED Course:  Vital signs in the ED were notable for the following: Afebrile; heart rate 89-96; initial blood pressure 91/56, which is improved to 137/70 following initial IV fluids, as further detailed below; respiratory rate 18-20, oxygen saturation 99 to 100% on room air.  Labs were notable for the following: CMP notable for the following: Sodium 135, bicarbonate 15, anion gap 14, BUN 91 compared to most recent prior value of 46 on 02/18/2021, creatinine 3.3 relative to most recent prior value 3.651 1922, glucose 337, and liver enzymes found to be within normal limits.  CBC notable for white blood cell count of 12,000, hemoglobin 7.9 associated with normocytic/normochromic findings as well as nonelevated RDW, platelet count 288.  INR 1.1.  Initial lactate 2.5.  COVID-19/influenza PCR were checked in the ED this evening and found to be negative.  Type and screen completed.  Imaging and additional notable ED work-up:  EKG shows sinus rhythm with heart rate 93, nonspecific intraventricular conduction delay, nonspecific T wave inversion in V3 as well as nonspecific less than  1 mm ST depression in lead I, in the absence of any associated ST elevation.  Chest x-ray shows no evidence of acute cardiopulmonary process.  EDP has consulted the on-call [place] gastroenterologist, Dr. Collene Mares, with additional recommendations pending at this time.  While in the ED, the following were administered: Protonix 40 mg IV x1, Zofran 4 mg IV x1, normal saline x1 L bolus, and transfusion of 1 unit PRBC was initiated.  Subsequently, the patient was admitted for overnight observation to the PCU for further evaluation and management of presenting acute upper GI bleed and associated acute blood loss anemia.     Review of Systems: As per HPI otherwise 10 point review of systems negative.   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    Past Surgical History:  Procedure Laterality Date   BIOPSY  06/27/2019   Procedure: BIOPSY;  Surgeon: Rogene Houston, MD;  [facility];  Service: Endoscopy;;  ascending colon polyp   BIOPSY  10/21/2020   Procedure: BIOPSY;  Surgeon: Rogene Houston, MD;  [facility];  Service: Endoscopy;;  esophageal,gastric polyp   BIOPSY  02/24/2021   Procedure: BIOPSY;  Surgeon: Rogene Houston, MD;  [facility];  Service: Endoscopy;;  distal and proximal esophageal biopsies    CARDIAC SURGERY     CATARACT EXTRACTION     COLONOSCOPY N/A 06/27/2019   [name]:Diverticulosis in the entire examined colon. tubular adenoma in ascending colon, 36m tubular adenoma in prox sigmoid. external hemorrhoids   CORONARY ARTERY  BYPASS GRAFT N/A 03/25/2019   Procedure: CORONARY ARTERY BYPASS GRAFTING (CABG) X  4 USING LEFT INTERNAL MAMMARY ARTERY AND RIGHT SAPHENOUS VEIN GRAFTS;  Surgeon: LLajuana Matte MD;  [place]  Service: Open Heart Surgery;  Laterality: N/A;   ESOPHAGOGASTRODUODENOSCOPY (EGD) WITH PROPOFOL N/A 10/21/2020   rehman:Normal hypopharynx.Normal proximal esophagus and mid esophagus.Esophageal mucosal changes consistent with long-segment Barrett's esophagus. (Focal low-grade dysplasia and atypia proximally) z line irregular 37 cm from incisors, 3 cm HH, single gastric polyp (fundic gland) normal duodenal bulb/second portion of duodenum, proximal margin of Barrett's at 34 cm   ESOPHAGOGASTRODUODENOSCOPY (EGD) WITH PROPOFOL N/A 02/24/2021   Procedure: ESOPHAGOGASTRODUODENOSCOPY (EGD) WITH PROPOFOL;  Surgeon: RRogene Houston MD;  [facility];  Service: Endoscopy;  Laterality: N/A;  1:35   EXCISION MORTON'S NEUROMA     EYE SURGERY Left    retina   POLYPECTOMY  06/27/2019   Procedure: POLYPECTOMY;  Surgeon: RRogene Houston MD;  [facility];  Service: Endoscopy;;  proximal sigmoid colon   RIGHT/LEFT HEART CATH AND CORONARY ANGIOGRAPHY N/A 03/12/2019   Procedure: RIGHT/LEFT HEART CATH AND CORONARY ANGIOGRAPHY;  Surgeon: VJettie Booze MD;  [place] [facility] [place] [facility];  Service: Cardiovascular;  Laterality: N/A;   SHOULDER ARTHROSCOPY WITH ROTATOR CUFF REPAIR AND SUBACROMIAL DECOMPRESSION Right 08/26/2020   Procedure: RIGHT SHOULDER MINI  OPEN ROTATOR CUFF REPAIR AND SUBACROMIAL DECOMPRESSION WITH PATCH GRAFT;  Surgeon: Susa Day, MD;  Location: [facility];  Service: Orthopedics;  Laterality: Right;  90 MINS GENERAL WITH BLOCK   TEE WITHOUT CARDIOVERSION N/A 03/25/2019   Procedure: TRANSESOPHAGEAL ECHOCARDIOGRAM (TEE);  Surgeon: [name], MD;  Location: [place];  Service: Open Heart Surgery;  Laterality: N/A;    Social History:  reports that he quit  smoking about 32 years ago. His smoking use included cigarettes. He has never used smokeless tobacco. He reports that he does not currently use alcohol. He reports that he does not use drugs.   Allergies  Allergen Reactions   Bee Venom Anaphylaxis   Atorvastatin     myalgia   Diltiazem Itching   Rosuvastatin     myalgias   Valsartan Itching    Family History  Problem Relation Age of Onset   Colon cancer Mother    Heart Problems Father    Diabetes Father    Valvular heart disease Father     Family history reviewed and not pertinent    Prior to Admission medications   Medication Sig Start Date End Date Taking? Authorizing Provider  acetaminophen (TYLENOL) 500 MG tablet Take 1,000 mg by mouth every 6 (six) hours as needed for moderate pain or headache.    Yes [provider]  albuterol (VENTOLIN HFA) 108 (90 Base) MCG/ACT inhaler Inhale 2 puffs into the lungs every 6 (six) hours as needed. Patient taking differently: Inhale 2 puffs into the lungs every 6 (six) hours as needed for wheezing or shortness of breath. 02/26/20  Yes Spero Geralds, MD  aspirin EC 81 MG tablet Take 4 tablets (325 mg total) by mouth daily. Swallow whole. 02/26/21  Yes [name], Mechele Dawley, MD  carvedilol (COREG) 12.5 MG tablet Take 1 tablet (12.5 mg total) by mouth 2 (two) times daily. 02/05/21  Yes Loel Dubonnet, NP  doxazosin (CARDURA) 4 MG tablet Take 4 mg by mouth at bedtime.   Yes [provider]  EPINEPHrine 0.3 mg/0.3 mL IJ SOAJ injection Inject 0.3 mg into the muscle as needed for anaphylaxis. 01/25/21  Yes [name], [name], MD  Evolocumab (REPATHA SURECLICK) 297 MG/ML SOAJ Inject 140 mg into the skin every 14 (fourteen) days. 03/15/21  Yes Skeet Latch, MD  furosemide (LASIX) 40 MG tablet Take 40 mg by mouth daily. 02/25/21  Yes [name], [name], MD  isosorbide-hydrALAZINE (BIDIL) 20-37.5 MG tablet Take 1 tablet by mouth in the morning and at bedtime. 02/23/21  Yes Skeet Latch,  MD  OZEMPIC, 1 MG/DOSE, 4 MG/3ML SOPN Inject 1 mg into the skin once a week. 01/22/21  Yes [provider]  sertraline (ZOLOFT) 100 MG tablet TAKE 1 TABLET BY MOUTH DAILY Patient taking differently: Take 100 mg by mouth daily. 01/20/21  Yes [name], [name], MD  insulin glargine (LANTUS SOLOSTAR) 100 UNIT/ML Solostar Pen Inject 25 Units into the skin every morning. Patient not taking: No sig reported 12/10/19   Renato Shin, MD  Insulin Pen Needle (B-D ULTRAFINE III SHORT PEN) 31G X 8 MM MISC 1 each by Does not apply route as directed. 05/30/19   Cassandria Anger, MD  pantoprazole (PROTONIX) 40 MG tablet Take 1 tablet (40 mg total) by mouth 2 (two) times daily before a meal. Patient not taking: Reported on 03/20/2021 10/25/20   Rogene Houston, MD  valsartan (DIOVAN) 80 MG tablet Take 1 tablet (80 mg total) by mouth daily. Patient not  taking: No sig reported 02/23/21   Skeet Latch, MD  valsartan (DIOVAN) 80 MG tablet Take 1 tablet (80 mg total) by mouth daily. Patient not taking: No sig reported 02/25/21   [name], MD     Objective    Physical Exam: Vitals:   03/20/21 0115 03/20/21 0130 03/20/21 0200 03/20/21 0230  BP: 119/76 135/65 (!) 159/81 135/70  Pulse: 94 90 89 91  Resp: '20 19 18 18  ' Temp:    97.8 F (36.6 C)  TempSrc:    Oral  SpO2: 99% 99% 100% 100%  Weight:      Height:        General: appears to be stated age; alert, oriented Skin: warm, dry, no rash Head:  AT/[place] Mouth:  Oral mucosa membranes appear moist, normal dentition Neck: supple; trachea midline Heart:  RRR; did not appreciate any M/R/G Lungs: CTAB, did not appreciate any wheezes, rales, or rhonchi Abdomen: + BS; soft, ND, NT Vascular: 2+ pedal pulses b/l; 2+ radial pulses b/l Extremities: no peripheral edema, no muscle wasting Neuro: strength and sensation intact in upper and lower extremities b/l    Labs on Admission: I have personally reviewed following labs and imaging  studies  CBC: Recent Labs  Lab 03/20/21 0020 03/20/21 0127  WBC 12.0*  --   NEUTROABS 8.8*  --   HGB 7.9* 7.1*  HCT 24.6* 21.0*  MCV 88.8  --   PLT 288  --    Basic Metabolic Panel: Recent Labs  Lab 03/20/21 0020 03/20/21 0127  NA 135 137  K 4.4 4.3  CL 106 108  CO2 15*  --   GLUCOSE 337* 308*  BUN 91* 110*  CREATININE 3.83* 4.10*  CALCIUM 8.7*  --    GFR: Estimated Creatinine Clearance: 21.4 mL/min (A) (by C-G formula based on SCr of 4.1 mg/dL (H)). Liver Function Tests: Recent Labs  Lab 03/20/21 0020  AST 10*  ALT 9  ALKPHOS 54  BILITOT 0.4  PROT 5.7*  ALBUMIN 3.3*   Recent Labs  Lab 03/20/21 0102  LIPASE 59*   No results for input(s): AMMONIA in the last 168 hours. Coagulation Profile: Recent Labs  Lab 03/20/21 0020  INR 1.1   Cardiac Enzymes: No results for input(s): CKTOTAL, CKMB, CKMBINDEX, TROPONINI in the last 168 hours. BNP (last 3 results) No results for input(s): PROBNP in the last 8760 hours. HbA1C: No results for input(s): HGBA1C in the last 72 hours. CBG: No results for input(s): GLUCAP in the last 168 hours. Lipid Profile: No results for input(s): CHOL, HDL, LDLCALC, TRIG, CHOLHDL, LDLDIRECT in the last 72 hours. Thyroid Function Tests: No results for input(s): TSH, T4TOTAL, FREET4, T3FREE, THYROIDAB in the last 72 hours. Anemia Panel: No results for input(s): VITAMINB12, FOLATE, FERRITIN, TIBC, IRON, RETICCTPCT in the last 72 hours. Urine analysis:    Component Value Date/Time   COLORURINE YELLOW 06/12/2020 1419   APPEARANCEUR HAZY (A) 06/12/2020 1419   LABSPEC 1.019 06/12/2020 1419   PHURINE 5.0 06/12/2020 1419   GLUCOSEU NEGATIVE 06/12/2020 1419   HGBUR NEGATIVE 06/12/2020 1419   BILIRUBINUR NEGATIVE 06/12/2020 1419   KETONESUR NEGATIVE 06/12/2020 1419   PROTEINUR >=300 (A) 06/12/2020 1419   NITRITE NEGATIVE 06/12/2020 1419   LEUKOCYTESUR NEGATIVE 06/12/2020 1419    Radiological Exams on Admission: DG Chest Portable  1 View  Result Date: 03/20/2021 CLINICAL DATA:  Recent EGD. Low suspicion for perforation. Vomiting blood. EXAM: PORTABLE CHEST 1 VIEW COMPARISON:  04/28/2020 FINDINGS: Postoperative changes  in the mediastinum. Heart size is normal. Some prominence of the hila, likely representing central vascular prominence. No airspace disease or consolidation in the lungs. No free air demonstrated under the hemidiaphragms. No pneumothorax. No pleural effusions. Mediastinal contours appear intact. IMPRESSION: No evidence of active pulmonary disease. Electronically Signed   By: Lucienne Capers M.D.   On: 03/20/2021 00:55     EKG: Independently reviewed, with result as described above.    Assessment/Plan   [name] is a 65 y.o. male with medical history significant for recently diagnosed esophageal adenocarcinoma, chronic combined systolic/diastolic heart failure, essential hypertension, type 2 diabetes mellitus, stage IV chronic kidney disease with baseline creatinine 3.0-3.6.  Who is admitted to [facility] on 03/20/2021 with acute upper GI bleed after presenting from home to [facility] ED complaining of hematemesis.    Principal Problem:   Acute upper GI bleed Active Problems:   Chronic combined systolic and diastolic heart failure (HCC)   Essential hypertension, benign   DM type 2 causing vascular disease (HCC)   Acute blood loss anemia   Lactic acidosis   Elevated serum creatinine   Leukocytosis   CKD (chronic kidney disease) stage 4, GFR 15-29 ml/min (HCC)     #) Acute Upper GI Bleed: diagnosis on the basis of 3-4 episodes of dark-colored emesis starting at 2100 on 03/19/2021, with elevated BUN of 91 compared to most recent prior value of 46 on 02/18/2021, with evidence of associated acute blood loss anemia as further quantified below, in addition to patient's report of 1 episode of dark tarry stool on the evening of 03/19/2021.  He is on a daily full dose aspirin, most recent dose on 03/19/2021,  but otherwise denies use of additional blood thinners at home.  Denies NSAID use. No known history of known underlying liver disease, and denies any history of alcohol abuse or recent alcohol consumption.  This is in the setting of recent diagnosis of esophageal adenocarcinoma, with EGD performed at [facility] on 03/12/2021.  In the absence of known liver disease, initiation of SBP prophylaxis does not appear to be warranted. Presentation and history are less suggestive of variceal bleed, and therefore there does not appear to be an indication for octreotide.    Of note, initial blood pressure soft at 91/56, but is subsequently improved to 135/70 following interval IV fluid bolus.  Not overtly tachycardic, but in the context of home Coreg.  He is currently asymptomatic, with last episode of hematemesis occurring at home prior to presenting to the ED.  Presentation associated with acute blood loss anemia, with presenting hemoglobin 7.9 relative to most recent prior value of 11.6 on 02/18/2021.  Of note, patient was typed and screened in the ED today, and order placed for transfusion of 1 unit PRBC.  Given the upper GI source with greater than three-point interval decrease in hemoglobin and initial borderline hypotension, that is subsequently resolved with IV fluids, will transfuse a total of 2 units PRBC, including the 1 unit ordered by the EDP, with close monitoring of ensuing hemodynamic stability, patient's symptomatology, and close trending of serial ensuing hemoglobin values, as further outlined below.  Additionally, in the setting of upper GI source, will initiate Protonix drip at this time. EDP consulted on-call gastroenterologist, Dr. Collene Mares, with additional recs pending at this time, as further detailed above.     Plan: NPO. Refraining from pharmacologic DVT prophylaxis. Monitor on telemetry. Monitor continuous pulse-ox. Maintain at least 2 large bore IV's. Check INR. Q4H  H&H's have been ordered through 1300  on 03/20/2021. On-call GI consulted, as above, with additional recs pending at this time.  Transfuse a total of 2 units PRBC as above also placed an order with the blood bank requesting that they stay 2 units ahead.  Protonix drip. Further evaluation and management of corresponding acute blood loss anemia, including adding-on iron studies to pre-transfusion specimen, as further detailed below. CMP in the AM.  Hold home aspirin.        #) Acute blood loss anemia: in the setting of presenting acute upper GI bleed, presenting Hgb noted to be 7.9 relative to most recent prior value of 11.6 on 02/18/2021, and in the context of baseline hemoglobin range of 10.5-12.  Currently asymptomatic, most recent episode hematemesis occurring at home earlier this evening.  Normotensive without tachycardia, but in the context of initial borderline hypotension that resolved following IV fluids, as above.   Plan: work-up and management for presenting suspected acute upper GI bleed, as above, with transfusion of 2 units PRBC, including close monitoring of Q4H H&H's, with clinical evaluation for determination of need for additional blood transfusion beyond these 2 units PRBC, as further described above. Monitor on telemetry. Monitor continuous pulse-ox. NPO. Refraining from pharmacologic DVT prophylaxis. Check INR.  Add on the following to initial lab specimen collected in the ED today: total iron, TIBC, ferritin, MMA, folic acid level, reticulocyte count. Gastroenterology consulted, as above.  Hold home aspirin.  Lasix 20 mg IV x1 to be administered following completion of transfusion of second unit PRBC.     #) Lactic acidosis: Initial lactate noted to be mildly elevated at 2.5.  Suspect that this is on the basis of diminished oxygen delivery capacity as a consequence of presenting acute blood loss anemia, as further detailed above.  No evidence of underlying infectious process at this time, including chest x-ray showing no  evidence of underlying pneumonia, while COVID-19 PCR performed in the ED this evening was found to be negative.  We will check urinalysis to further evaluate.  Status post normal saline x1 L bolus in the ED.  Plan: Further evaluation management of presenting acute blood loss anemia in the setting of acute upper gastrointestinal bleed, including PRBC transfusion, as outlined above.  Repeat lactic acid level with a.m. labs.  Repeat CMP in the morning.  Check urinalysis.      #) Elevated creatinine: In the setting of stage IV chronic kidney disease with baseline creatinine of 3.0-3.6, with most recent prior serum creatinine data point noted to be 3.65 on 02/18/2021, presenting creatinine mildly elevated at 3.83, but without significant elevation to meet quantitative threshold for definition of acute kidney injury superimposed on stage IV chronic kidney disease.  Suspect interval increase in serum creatinine on the basis of prerenal factors, namely that of diminished renal perfusion as a consequence of decline in oxygen delivery capacity due to presenting with acute blood loss anemia, as further detailed above.  Plan: Check urinalysis with microscopy.  Add on random urine sodium as well as random urine creatinine.  Further evaluation management of acute blood loss anemia in the setting of acute upper GI bleed, including PRBC transfusion, as above.  Monitor strict I's and O's and daily weights.  Attempt to avoid nephrotoxic agents.  Repeat BMP in the morning.      #) Leukocytosis: Mildly elevated white blood cell count of 12,000.  No additional SIRS criteria are met at this time.  Clinically, no evidence to suggest underlying  infectious process at this time, as further outlined above, although will check urinalysis to further evaluate.  Rather, suspect mild elevation of white blood cell count due to reactive leukocytosis stemming from presenting nausea/vomiting associated acute upper gastrointestinal bleed  as well as hemoconcentrated impacts of ensuing acute blood loss anemia, as further detailed above.  Plan: Check urinalysis, as above.  Repeat CBC with differential in the morning.  Further evaluation management of acute blood loss anemia, including PVC transfusion, as above.  Monitor strict I's and O's and daily weights.       #) Chronic combined systolic and diastolic heart failure: documented history of such, with most recent echocardiogram performed on 02/04/2021 notable for dilated cardiomyopathy with LVEF 45 to 50% and grade 1 diastolic dysfunction, with additional findings as outlined above..  No clinical evidence to suggest acutely decompensated heart failure at this time.  Patient's home diuretic regimen reportedly consists of the following: Lasix 40 mg p.o. daily. Home cardiac medications also include the following: Coreg, Isordil, hydralazine. Etiology: Ischemic cardiomyopathy in the setting of a documented history of coronary artery disease.  Close monitoring for ensuing development of acute volume overload given plan for PRBC transfusion of component of management of presenting acute blood loss anemia in the setting of acute upper gastrointestinal bleed.   Plan: monitor strict I's & O's and daily weights. Repeat BMP in the morning. Check serum magnesium level.  Lasix 20 mg IV x1 to be administered after completion of transfusion of second unit PRBC, as further detailed above.  Hold home beta-blocker to allow for compensatory tachycardia in the setting of intravascular depletion as a consequence of acute blood loss anemia.  Hold additional home antihypertensive medications for now.  Monitor on telemetry.  Monitor continuous pulse oximetry.      #) Type 2 diabetes mellitus: Documented history of such, on a Ozempic as an outpatient.  Present blood sugar noted to be 337.  Of note, most recent hemoglobin A1c found to be 6.5% when checked on 12/25/2020.  Plan: Hold home [place].  In setting  of current n.p.o. status, will pursue Accu-Cheks every 6 hours with low-dose sliding scale insulin.     DVT prophylaxis: SCDs Code Status: Full code Family Communication: none Disposition Plan: Per Rounding Team Consults called: On-call [facility], Dr. Collene Mares, consulted, as further detailed above;   Admission status: Observation; PCU   Of note, this patient was added by me to the following Admit List/Treatment Team:  mcadmits.   Of note, the Adult Admission Order Set (Multimorbid order set) was used by me in the admission process for this patient.  PLEASE NOTE THAT [employer]AGON DICTATION SOFTWARE WAS USED IN THE CONSTRUCTION OF THIS NOTE.   Rhetta Mura DO [facility] Pager [phone] From [place]   03/20/2021, 2:58 AM

## 2021-03-20 NOTE — Evaluation (Signed)
Physical Therapy Evaluation Patient Details Name: [name] MRN: [id number] DOB: December 12, 1955 Today's Date: 03/20/2021  History of Present Illness  Pt is a 65 y.o. male who presented 03/20/21 with hematemesis and acute upper GI bleed. PMH: recently diagnosed esophageal adenocarcinoma, chronic combined systolic/diastolic heart failure, essential hypertension, type 2 diabetes mellitus, stage IV chronic kidney disease, CAD, MI, R eye vision loss   Clinical Impression  Pt presents with condition above and deficits mentioned below, see PT Problem List. PTA, he was independent, normally not using his SPC, and living alone in a split-level house with 2 STE. Pt denies any falls in past 6 months. Currently, pt is displaying acute onset tingling sensation in his R arm that began yesterday (10/7). In addition, he does display some slight weakness in his R UE and R hip compared to his L. MD and RN notified of these findings. Pt also displays deficits in activity tolerance and balance, needing min guard-minA to ambulate up to ~60 ft without UE support this date. No significant asymmetry noted between sides during gait. I expect his balance and functional mobility safety and independence will progress well. Pt would benefit from further acute PT services to address his deficits to maximize his safety and independence prior to returning home. At this time, all PT needs appear to be able to be met/addressed acutely.     Recommendations for follow up therapy are one component of a multi-disciplinary discharge planning process, led by the attending physician.  Recommendations may be updated based on patient status, additional functional criteria and insurance authorization.  Follow Up Recommendations No PT follow up;Supervision for mobility/OOB    Equipment Recommendations  None recommended by PT    Recommendations for Other Services       Precautions / Restrictions Precautions Precautions:  Fall Restrictions Weight Bearing Restrictions: No      Mobility  Bed Mobility Overal bed mobility: Needs Assistance Bed Mobility: Supine to Sit;Sit to Supine     Supine to sit: Min assist;HOB elevated Sit to supine: Supervision   General bed mobility comments: Pt requesting PT's hand to pull trunk up to sit edge of stretcher, minA. Returns to supine with supervision.    Transfers Overall transfer level: Needs assistance Equipment used: None Transfers: Sit to/from Stand Sit to Stand: Min guard         General transfer comment: Pt unsteady and quick with descent to sit often, min guard assist for safety sit <> stand from various surfaces.  Ambulation/Gait Ambulation/Gait assistance: Min guard;Min assist Gait Distance (Feet): 60 Feet Assistive device: None Gait Pattern/deviations: Step-through pattern;Decreased stride length;Trunk flexed Gait velocity: reduced Gait velocity interpretation: <1.31 ft/sec, indicative of household ambulator General Gait Details: Pt with mild medial <> lateral trunk sway, needing minA 1x to prevent LOB when turning around, but otherwise min guard assist for safety. Decreased gait speed and bil stride length with inferior gaze noted.  Stairs            Wheelchair Mobility    Modified Rankin (Stroke Patients Only)       Balance Overall balance assessment: Mild deficits observed, not formally tested                                           Pertinent Vitals/Pain      Home Living Family/patient expects to be discharged to:: Private residence Living Arrangements: Alone  Available Help at Discharge: Friend(s);Available 24 hours/day Type of Home: House Home Access: Stairs to enter Entrance Stairs-Rails: Left (ascending) Entrance Stairs-Number of Steps: 2 Home Layout: Multi-level Home Equipment: Cane - single point;Grab bars - tub/shower      Prior Function Level of Independence: Independent          Comments: Normally does not use the cane. Reports he no longer drives, but his significant other drives him. Denies any falls in past 6 months.     Hand Dominance   Dominant Hand: Right    Extremity/Trunk Assessment   Upper Extremity Assessment Upper Extremity Assessment: RUE deficits/detail;LUE deficits/detail RUE Deficits / Details: Reports hx of rotator cuff tear/sx and thus limitations in shoulder ROM/strength, achieves ~90 degrees shoulder flexion AROM; R UE slightly weaker than L with R MMT scores of 4 elbow flexion, 5 wrist extension/flexion; reports acute onset of tingling sensation from elbow and inferiorly that began 03/19/21, notified MD and RN of findings; no dysdiadiochoknesia or dysmetria noted RUE Sensation: decreased light touch (from elbow and inferiorly) RUE Coordination: WNL LUE Deficits / Details: Reports hx of rotator cuff tear and thus limitations in shoulder ROM/strength, achieves ~90 degrees shoulder flexion AROM; R UE slightly weaker than L with L MMT scores of 4+ elbow flexion, 5 wrist extension/flexion; denied numbness/tingling; no dysdiadiochoknesia or dysmetria noted LUE Sensation: WNL LUE Coordination: WNL    Lower Extremity Assessment Lower Extremity Assessment: RLE deficits/detail RLE Deficits / Details: Decreased strength in R hip compared to L with R hip flexion MMT score of 4- compared to score of 4 on L; denies numbness/tingling; no dysdiadiochoknesia or dysmetria noted RLE Sensation: WNL RLE Coordination: decreased gross motor (noted functionally bil)    Cervical / Trunk Assessment Cervical / Trunk Assessment: Normal  Communication   Communication: No difficulties  Cognition Arousal/Alertness: Awake/alert Behavior During Therapy: WFL for tasks assessed/performed Overall Cognitive Status: Within Functional Limits for tasks assessed                                        General Comments      Exercises     Assessment/Plan     PT Assessment Patient needs continued PT services  PT Problem List Decreased strength;Decreased range of motion;Decreased activity tolerance;Decreased balance;Decreased mobility;Decreased coordination;Impaired sensation       PT Treatment Interventions DME instruction;Gait training;Stair training;Functional mobility training;Therapeutic activities;Therapeutic exercise;Balance training;Neuromuscular re-education;Patient/family education    PT Goals (Current goals can be found in the Care Plan section)  Acute Rehab PT Goals Patient Stated Goal: to get better PT Goal Formulation: With patient Time For Goal Achievement: 04/03/21 Potential to Achieve Goals: Good    Frequency Min 3X/week   Barriers to discharge        Co-evaluation               AM-PAC PT "6 Clicks" Mobility  Outcome Measure Help needed turning from your back to your side while in a flat bed without using bedrails?: A Little Help needed moving from lying on your back to sitting on the side of a flat bed without using bedrails?: A Little Help needed moving to and from a bed to a chair (including a wheelchair)?: A Little Help needed standing up from a chair using your arms (e.g., wheelchair or bedside chair)?: A Little Help needed to walk in hospital room?: A Little Help needed climbing 3-5 steps with  a railing? : A Little 6 Click Score: 18    End of Session Equipment Utilized During Treatment: Gait belt Activity Tolerance: Patient tolerated treatment well Patient left: in bed;with call bell/phone within reach Nurse Communication: Mobility status;Other (comment) (strength and sensation findings) PT Visit Diagnosis: Unsteadiness on feet (R26.81);Other abnormalities of gait and mobility (R26.89);Muscle weakness (generalized) (M62.81);Difficulty in walking, not elsewhere classified (R26.2)    Time: 7255-0016 PT Time Calculation (min) (ACUTE ONLY): 25 min   Charges:   PT Evaluation $PT Eval Moderate  Complexity: 1 Mod PT Treatments $Gait Training: 8-22 mins        Moishe Spice, PT, DPT Acute Rehabilitation Services  Pager: [phone] Office: [place] 03/20/2021, 9:27 AM

## 2021-03-20 NOTE — ED Provider Notes (Signed)
Emergency Medicine Provider Triage Evaluation Note  [name] , a 65 y.o. male  was evaluated in triage.  Pt complains of vomiting bright red blood and black bloody emesis began today.  He had EGD done last week with biopsies taken.  No history of alcohol use or varices.   Review of Systems  Positive: Hematemesis, melena Negative: Fever  Physical Exam  There were no vitals taken for this visit. Gen:   Awake, no distress   Resp:  Normal effort  MSK:   Moves extremities without difficulty  Other:  Somewhat pale  Medical Decision Making  Medically screening exam initiated at 12:12 AM.  Appropriate orders placed.  [name] was informed that the remainder of the evaluation will be completed by another provider, this initial triage assessment does not replace that evaluation, and the importance of remaining in the ED until their evaluation is complete.  65 year old male borderline hypotension elevated heart rate without tachycardia  Hematemesis and melena.  Discussed with charge we will move expediently to room.  Type and screen, INR, basic labs obtained.   [name], [place] 03/20/21 0019    Fatima Blank, MD 03/21/21 1723

## 2021-03-20 NOTE — ED Provider Notes (Signed)
[facility] EMERGENCY DEPARTMENT Provider Note  CSN: [id number] Arrival date & time: 03/20/21 0000  Chief Complaint(s) GI Bleeding  HPI [name] is a 65 y.o. 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  EGD was performed on September 30.  He presents today for hematemesis and melena that began 2 hours prior to arrival. Patient is not anticoagulated but takes aspirin. Denies any abdominal pain, chest pain. No nausea. No recent fevers or infections. No coughing congestion. No shortness of breath.  The patient does admit to feeling lightheaded which is worsened with postural changes.  Alleviated by sitting down.  The history is provided by the patient.   Past Medical History Past Medical History:  Diagnosis Date   Arthritis    hips shoulders   CAD (coronary artery disease) 03/03/2019   CHF (congestive heart failure) ([place]) 2020   Chronic combined systolic and diastolic heart failure ([place]) 04/26/2018   Admitted 11/14-11/20/19-diuresed 10L   Fatigue 10/14/2020   Hypertension    Myocardial infarction [facility])    Sleep apnea    Type 2 diabetes mellitus with diabetic nephropathy ([place]) 04/23/2019   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   Patient Active Problem List   Diagnosis Date Noted   Acute upper GI bleed 03/20/2021   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   Statin myopathy 01/26/2021   Hyperlipidemia associated with type 2 diabetes mellitus ([place]) 12/25/2020   Fatigue 10/14/2020   GERD (gastroesophageal reflux disease) 10/06/2020   Anemia of chronic disease 10/06/2020   Complete rotator cuff tear 08/26/2020   Myalgia due to statin 03/11/2020   Arthritis of both acromioclavicular joints 08/21/2019   Peripheral arterial disease ([place])  05/31/2019   Edema 05/27/2019   CAD (coronary artery disease) 05/27/2019   DM type 2 causing vascular disease ([place]) 04/23/2019   S/P CABG (coronary artery bypass graft) 03/25/2019   Moderate nonproliferative diabetic retinopathy of both eyes associated with type 2 diabetes mellitus ([place]) 03/18/2019   Special screening for malignant neoplasms, colon 02/04/2019   Family hx of colon cancer 02/04/2019   Cardiomyopathy ([place]) 06/08/2018   Anemia due to stage 3 chronic kidney disease ([place]) 06/08/2018   Chronic combined systolic and diastolic heart failure ([place]) 04/26/2018   Essential hypertension, benign 04/26/2018   Diabetic nephropathy ([place]) 04/26/2018   Mixed hyperlipidemia 04/26/2018   Elevated troponin 04/26/2018   Home Medication(s) Prior to Admission medications   Medication Sig Start Date End Date Taking? Authorizing Provider  acetaminophen (TYLENOL) 500 MG tablet Take 1,000 mg by mouth every 6 (six) hours as needed for moderate pain or headache.    Yes [provider]  albuterol (VENTOLIN HFA) 108 (90 Base) MCG/ACT inhaler Inhale 2 puffs into the lungs every 6 (six) hours as needed. Patient taking differently: Inhale 2 puffs into the lungs every 6 (six) hours as needed for wheezing or shortness of breath. 02/26/20  Yes Spero Geralds, MD  aspirin EC 81 MG tablet Take 4 tablets (325 mg total) by mouth daily. Swallow whole. 02/26/21  Yes [name], Mechele Dawley, MD  carvedilol (COREG) 12.5 MG tablet Take 1 tablet (12.5 mg total) by mouth 2 (two) times daily. 02/05/21  Yes Loel Dubonnet, NP  doxazosin (CARDURA) 4 MG tablet Take 4 mg by mouth at bedtime.   Yes [provider]  EPINEPHrine 0.3 mg/0.3 mL IJ SOAJ injection Inject 0.3 mg into the muscle as needed for anaphylaxis. 01/25/21  Yes [name], [name], MD  Evolocumab (REPATHA SURECLICK) 702 MG/ML SOAJ Inject 140 mg into the skin every 14 (fourteen) days. 03/15/21  Yes Skeet Latch, MD  furosemide (LASIX) 40 MG tablet Take  40 mg by mouth daily. 02/25/21  Yes [name], [name], MD  isosorbide-hydrALAZINE (BIDIL) 20-37.5 MG tablet Take 1 tablet by mouth in the morning and at bedtime. 02/23/21  Yes Skeet Latch, MD  OZEMPIC, 1 MG/DOSE, 4 MG/3ML SOPN Inject 1 mg into the skin once a week. 01/22/21  Yes [provider]  sertraline (ZOLOFT) 100 MG tablet TAKE 1 TABLET BY MOUTH DAILY Patient taking differently: Take 100 mg by mouth daily. 01/20/21  Yes [name], [name], MD  insulin glargine (LANTUS SOLOSTAR) 100 UNIT/ML Solostar Pen Inject 25 Units into the skin every morning. Patient not taking: No sig reported 12/10/19   Renato Shin, MD  Insulin Pen Needle (B-D ULTRAFINE III SHORT PEN) 31G X 8 MM MISC 1 each by Does not apply route as directed. 05/30/19   Cassandria Anger, MD  pantoprazole (PROTONIX) 40 MG tablet Take 1 tablet (40 mg total) by mouth 2 (two) times daily before a meal. Patient not taking: Reported on 03/20/2021 10/25/20   Rogene Houston, MD  valsartan (DIOVAN) 80 MG tablet Take 1 tablet (80 mg total) by mouth daily. Patient not taking: No sig reported 02/23/21   Skeet Latch, MD  valsartan (DIOVAN) 80 MG tablet Take 1 tablet (80 mg total) by mouth daily. Patient not taking: No sig reported 02/25/21   [name], MD                                                                                                                                    Past Surgical History Past Surgical History:  Procedure Laterality Date   BIOPSY  06/27/2019   Procedure: BIOPSY;  Surgeon: Rogene Houston, MD;  [facility];  Service: Endoscopy;;  ascending colon polyp   BIOPSY  10/21/2020   Procedure: BIOPSY;  Surgeon: Rogene Houston, MD;  [facility];  Service: Endoscopy;;  esophageal,gastric polyp   BIOPSY  02/24/2021   Procedure: BIOPSY;  Surgeon: Rogene Houston, MD;  [facility];  Service: Endoscopy;;  distal and proximal esophageal biopsies    CARDIAC SURGERY      CATARACT EXTRACTION     COLONOSCOPY N/A 06/27/2019   [name]:Diverticulosis in the entire examined colon. tubular adenoma in ascending colon, 83mm tubular adenoma in prox sigmoid. external hemorrhoids   CORONARY ARTERY BYPASS GRAFT N/A 03/25/2019   Procedure: CORONARY ARTERY BYPASS GRAFTING (CABG) X  4 USING LEFT INTERNAL MAMMARY ARTERY AND RIGHT SAPHENOUS VEIN GRAFTS;  Surgeon: [name], MD;  Location: [facility];  Service: Open Heart Surgery;  Laterality: N/A;  ESOPHAGOGASTRODUODENOSCOPY (EGD) WITH PROPOFOL N/A 10/21/2020   rehman:Normal hypopharynx.Normal proximal esophagus and mid esophagus.Esophageal mucosal changes consistent with long-segment Barrett's esophagus. (Focal low-grade dysplasia and atypia proximally) z line irregular 37 cm from incisors, 3 cm HH, single gastric polyp (fundic gland) normal duodenal bulb/second portion of duodenum, proximal margin of Barrett's at 34 cm   ESOPHAGOGASTRODUODENOSCOPY (EGD) WITH PROPOFOL N/A 02/24/2021   Procedure: ESOPHAGOGASTRODUODENOSCOPY (EGD) WITH PROPOFOL;  Surgeon: Rogene Houston, MD;  [facility];  Service: Endoscopy;  Laterality: N/A;  1:35   EXCISION MORTON'S NEUROMA     EYE SURGERY Left    retina   POLYPECTOMY  06/27/2019   Procedure: POLYPECTOMY;  Surgeon: Rogene Houston, MD;  [facility];  Service: Endoscopy;;  proximal sigmoid colon   RIGHT/LEFT HEART CATH AND CORONARY ANGIOGRAPHY N/A 03/12/2019   Procedure: RIGHT/LEFT HEART CATH AND CORONARY ANGIOGRAPHY;  Surgeon: [name], MD;  Location: [facility];  Service: Cardiovascular;  Laterality: N/A;   SHOULDER ARTHROSCOPY WITH ROTATOR CUFF REPAIR AND SUBACROMIAL DECOMPRESSION Right 08/26/2020   Procedure: RIGHT SHOULDER MINI OPEN ROTATOR CUFF REPAIR AND SUBACROMIAL DECOMPRESSION WITH PATCH GRAFT;  Surgeon: Susa Day, MD;  Location: [facility];  Service: Orthopedics;  Laterality: Right;  90 MINS GENERAL WITH BLOCK   TEE WITHOUT  CARDIOVERSION N/A 03/25/2019   Procedure: TRANSESOPHAGEAL ECHOCARDIOGRAM (TEE);  Surgeon: [name], MD;  Location: [facility];  Service: Open Heart Surgery;  Laterality: N/A;   Family History Family History  Problem Relation Age of Onset   Colon cancer Mother    Heart Problems Father    Diabetes Father    Valvular heart disease Father     Social History Social History   Tobacco Use   Smoking status: Former    Types: Cigarettes    Quit date: 07/06/1988    Years since quitting: 32.7   Smokeless tobacco: Never  Vaping Use   Vaping Use: Never used  Substance Use Topics   Alcohol use: Not Currently    Comment: socially   Drug use: No   Allergies Bee venom, Atorvastatin, Diltiazem, Rosuvastatin, and Valsartan  Review of Systems Review of Systems All other systems are reviewed and are negative for acute change except as noted in the HPI  Physical Exam Vital Signs  I have reviewed the triage vital signs BP 135/70   Pulse 91   Temp 97.8 F (36.6 C) (Oral)   Resp 18   Ht 5\' 10"  (1.778 m)   Wt 98 kg   SpO2 100%   BMI 31.00 kg/m   Physical Exam Vitals reviewed.  Constitutional:      General: He is not in acute distress.    Appearance: He is well-developed. He is not diaphoretic.  HENT:     Head: Normocephalic and atraumatic.     Nose: Nose normal.  Eyes:     General: No scleral icterus.       Right eye: No discharge.        Left eye: No discharge.     Conjunctiva/sclera: Conjunctivae normal.     Pupils: Pupils are equal, round, and reactive to light.  Cardiovascular:     Rate and Rhythm: Normal rate and regular rhythm.     Heart sounds: No murmur heard.   No friction rub. No gallop.  Pulmonary:     Effort: Pulmonary effort is normal. No respiratory distress.     Breath sounds: Normal breath sounds. No stridor. No rales.  Abdominal:  General: There is no distension.     Palpations: Abdomen is soft.     Tenderness: There is no abdominal tenderness.   Musculoskeletal:        General: No tenderness.     Cervical back: Normal range of motion and neck supple.  Skin:    General: Skin is warm and dry.     Findings: No erythema or rash.  Neurological:     Mental Status: He is alert and oriented to person, place, and time.    ED Results and Treatments Labs (all labs ordered are listed, but only abnormal results are displayed) Labs Reviewed  COMPREHENSIVE METABOLIC PANEL - Abnormal; Notable for the following components:      Result Value   CO2 15 (*)    Glucose, Bld 337 (*)    BUN 91 (*)    Creatinine, Ser 3.83 (*)    Calcium 8.7 (*)    Total Protein 5.7 (*)    Albumin 3.3 (*)    AST 10 (*)    GFR, Estimated 17 (*)    All other components within normal limits  CBC WITH DIFFERENTIAL/PLATELET - Abnormal; Notable for the following components:   WBC 12.0 (*)    RBC 2.77 (*)    Hemoglobin 7.9 (*)    HCT 24.6 (*)    Neutro Abs 8.8 (*)    All other components within normal limits  LIPASE, BLOOD - Abnormal; Notable for the following components:   Lipase 59 (*)    All other components within normal limits  LACTIC ACID, PLASMA - Abnormal; Notable for the following components:   Lactic Acid, Venous 2.5 (*)    All other components within normal limits  I-STAT CHEM 8, ED - Abnormal; Notable for the following components:   BUN 110 (*)    Creatinine, Ser 4.10 (*)    Glucose, Bld 308 (*)    TCO2 18 (*)    Hemoglobin 7.1 (*)    HCT 21.0 (*)    All other components within normal limits  RESP PANEL BY RT-PCR (FLU A&B, COVID) ARPGX2  PROTIME-INR  HIV ANTIBODY (ROUTINE TESTING W REFLEX)  MAGNESIUM  COMPREHENSIVE METABOLIC PANEL  CBC  PROTIME-INR  HEMOGLOBIN AND HEMATOCRIT, BLOOD  HEMOGLOBIN AND HEMATOCRIT, BLOOD  TYPE AND SCREEN  PREPARE RBC (CROSSMATCH)                                                                                                                         EKG  EKG Interpretation  Date/Time:  Saturday March 20 2021 01:19:51 EDT Ventricular Rate:  93 PR Interval:  166 QRS Duration: 124 QT Interval:  388 QTC Calculation: 483 R Axis:   78 Text Interpretation: Sinus rhythm Nonspecific intraventricular conduction delay Anterolateral infarct, age indeterminate Confirmed by Addison Lank [phone]) on 03/20/2021 2:44:04 AM       Radiology DG Chest Portable 1 View  Result Date: 03/20/2021 CLINICAL DATA:  Recent EGD. Low suspicion for perforation.  Vomiting blood. EXAM: PORTABLE CHEST 1 VIEW COMPARISON:  04/28/2020 FINDINGS: Postoperative changes in the mediastinum. Heart size is normal. Some prominence of the hila, likely representing central vascular prominence. No airspace disease or consolidation in the lungs. No free air demonstrated under the hemidiaphragms. No pneumothorax. No pleural effusions. Mediastinal contours appear intact. IMPRESSION: No evidence of active pulmonary disease. Electronically Signed   By: Lucienne Capers M.D.   On: 03/20/2021 00:55    Pertinent labs & imaging results that were available during my care of the patient were reviewed by me and considered in my medical decision making (see MDM for details).  Medications Ordered in ED Medications  sodium chloride 0.9 % bolus 1,000 mL (0 mLs Intravenous Stopped 03/20/21 0227)    And  sodium chloride 0.9 % bolus 1,000 mL (0 mLs Intravenous Hold 03/20/21 0302)    And  0.9 %  sodium chloride infusion (0 mLs Intravenous Hold 03/20/21 0303)  0.9 %  sodium chloride infusion (has no administration in time range)  acetaminophen (TYLENOL) tablet 650 mg (has no administration in time range)    Or  acetaminophen (TYLENOL) suppository 650 mg (has no administration in time range)  pantoprozole (PROTONIX) 80 mg /NS 100 mL infusion (has no administration in time range)  ondansetron (ZOFRAN) injection 4 mg (has no administration in time range)  pantoprazole (PROTONIX) injection 40 mg (40 mg Intravenous Given 03/20/21 0124)  ondansetron (ZOFRAN)  injection 4 mg (4 mg Intravenous Given 03/20/21 0124)                                                                                                                                     Procedures .1-3 Lead EKG Interpretation Performed by: Fatima Blank, MD Authorized by: Fatima Blank, MD     Interpretation: normal     ECG rate:  91   ECG rate assessment: normal     Rhythm: sinus rhythm     Ectopy: none     Conduction: normal   .Critical Care Performed by: Fatima Blank, MD Authorized by: Fatima Blank, MD   Critical care provider statement:    Critical care time (minutes):  45   Critical care was necessary to treat or prevent imminent or life-threatening deterioration of the following conditions:  Circulatory failure   Critical care was time spent personally by me on the following activities:  Discussions with consultants, evaluation of patient's response to treatment, examination of patient, ordering and performing treatments and interventions, ordering and review of laboratory studies, ordering and review of radiographic studies, pulse oximetry, re-evaluation of patient's condition, obtaining history from patient or surrogate and review of old charts   Care discussed with: admitting provider    (including critical care time)  Medical Decision Making / ED Course I have reviewed the nursing notes for this encounter and the patient's prior records (if available in EHR or on provided paperwork).  AVORY MIMBS was evaluated in Emergency Department on 03/20/2021 for the symptoms described in the history of present illness. He was evaluated in the context of the global COVID-19 pandemic, which necessitated consideration that the patient might be at risk for infection with the SARS-CoV-2 virus that causes COVID-19. Institutional protocols and algorithms that pertain to the evaluation of patients at risk for COVID-19 are in a state of rapid change based  on information released by regulatory bodies including the CDC and federal and state organizations. These policies and algorithms were followed during the patient's care in the ED.  Clinical Course as of 03/20/21 0308  Sat Mar 20, 2021  0244 Patient presents with symptoms of upper GI bleed. Recent EGD due to adenocarcinoma of the distal esophagus. Patient with initial soft blood pressures. Improved with IV fluids.  Work-up is notable for nearly 4 g drop in the hemoglobin in the last month. Mildly worsening renal function but close to previous panel last month.  Chest x-ray without evidence of pneumomediastinum or evidence of aspiration.  Will transfuse 1 unit of blood. Admit to medicine. [facility] consult in the morning. [PC]    Clinical Course User Index [PC] Rynlee Lisbon, Grayce Sessions, MD     Pertinent labs & imaging results that were available during my care of the patient were reviewed by me and considered in my medical decision making:    Final Clinical Impression(s) / ED Diagnoses Final diagnoses:  Acute upper GI bleed     This chart was dictated using voice recognition software.  Despite best efforts to proofread,  errors can occur which can change the documentation meaning.    Fatima Blank, MD 03/20/21 [phone]

## 2021-03-20 NOTE — ED Notes (Signed)
Checked patient blood sugar it was 116 notified the RN

## 2021-03-20 NOTE — ED Triage Notes (Signed)
Patient reports bloody emesis and black stools this evening , pale with fatigue and generalized weakness.

## 2021-03-20 NOTE — ED Notes (Signed)
Pt receiving blood will have to wait for morning labs 

## 2021-03-20 NOTE — Progress Notes (Addendum)
PROGRESS NOTE                                                                                                                                                                                                             Patient Demographics:    [name], is a 65 y.o. male, DOB - [birth year], WJX:[id number]  Outpatient Primary MD for the patient is [name], MD    LOS - 0  Admit date - 03/20/2021    Chief Complaint  Patient presents with   GI Bleeding       Brief Narrative (HPI from H&P)   [name] is a 65 y.o. male with medical history significant for recently diagnosed esophageal adenocarcinoma at [facility],  chronic combined systolic/diastolic heart failure, essential hypertension, type 2 diabetes mellitus, stage IV chronic kidney disease with baseline creatinine 3.0-3.6.  Who is admitted to [facility] on 03/20/2021 with acute upper GI bleed after presenting from home to [facility] ED complaining of hematemesis and some melena.   Subjective:    [name] today has, No headache, No chest pain, No abdominal pain - No Nausea, No new weakness tingling or numbness, no SOB.   Assessment  & Plan :   UGI bleed with acute blood loss related anemia due to recently diagnosed submucosal esophageal adenocarcinoma and history of Barrett's esophagus - he is currently on IV PPI drip has received 1 unit of packed RBC on 03/20/2021, continue to monitor H&H, have discussed the case with GI physician Dr. Benson Norway who will likely do EGD on 03/21/2021, continue bowel rest monitor H&H and transfuse as needed goal of hemoglobin will be around 7.  Post discharge follow-up with his GI physician and surgeon at [facility]  2.  Mild AKI on top of underlying CKD stage IV.  Baseline creatinine 3.5, likely due to acute blood loss, hold ARB transfuse monitor renal function.    3.  Some anemia related hypoperfusion causing mild lactic  acidosis.  Clinically stable.  No signs of infection or sepsis  4. Obesity with OSA - BMI 31 - follow with PCP, nighttime BiPAP.   5.  Chronic combined systolic and EF 21% on echocardiogram done few months ago.  Currently compensated.  Low dose Lasix, Coreg and BiDil for now.  Monitor.  6.  Hypertension - Meds  as in #5.  7.  Recent right rotator cuff surgery about 1 month ago with some mild right shoulder weakness and right arm tingling numbness ongoing for a few weeks.  Stable.    8. DM2 - ISS  Lab Results  Component Value Date   HGBA1C 6.5 (H) 12/25/2020   CBG (last 3)  Recent Labs    03/20/21 0623  GLUCAP 196*        Condition -   Guarded  Family Communication  :  None  Code Status :  Full  Consults  :  GI  PUD Prophylaxis : PPI   Procedures  :            Disposition Plan  :    Status is: Observation  Dispo: The patient is from: Home              Anticipated d/c is to: Home              Patient currently is not medically stable to d/c.   Difficult to place patient No  DVT Prophylaxis  :    SCDs Start: 03/20/21 0258    Lab Results  Component Value Date   PLT 288 03/20/2021    Diet :  Diet Order             Diet NPO time specified  Diet effective now                    Inpatient Medications  Scheduled Meds:  sodium chloride   Intravenous Once   insulin aspart  0-9 Units Subcutaneous Q6H   sodium chloride flush  3 mL Intravenous Q12H   Continuous Infusions:  sodium chloride     sodium chloride 100 mL/hr at 03/20/21 0616   pantoprazole 8 mg/hr (03/20/21 0621)   PRN Meds:.acetaminophen **OR** acetaminophen, ondansetron (ZOFRAN) IV  Antibiotics  :    Anti-infectives (From admission, onward)    None        Time Spent in minutes  30   Lala Lund M.D on 03/20/2021 at 10:41 AM  To page go to [email or web address]   [facility] -  Office  [phone]  See all Orders from today for further details    Objective:    Vitals:   03/20/21 0311 03/20/21 0354 03/20/21 0531 03/20/21 0700  BP: (!) 144/81 (!) 152/62 (!) 148/90 (!) 154/85  Pulse: 91 99 98 94  Resp: 16 16 16 14   Temp: 97.8 F (36.6 C) 98.1 F (36.7 C) 98.1 F (36.7 C)   TempSrc: Oral Oral Oral   SpO2: 100% 97% 97% 97%  Weight:      Height:        Wt Readings from Last 3 Encounters:  03/20/21 98 kg  02/23/21 92.7 kg  02/18/21 90.7 kg     Intake/Output Summary (Last 24 hours) at 03/20/2021 1041 Last data filed at 03/20/2021 0646 Gross per 24 hour  Intake 1000.14 ml  Output 300 ml  Net 700.14 ml     Physical Exam  Awake Alert, No new F.N deficits, Normal affect [place].AT,PERRAL Supple Neck,No JVD, No cervical lymphadenopathy appriciated.  Symmetrical Chest wall movement, Good air movement bilaterally, CTAB RRR,No Gallops,Rubs or new Murmurs, No Parasternal Heave +ve B.Sounds, Abd Soft, No tenderness, No organomegaly appriciated, No rebound - guarding or rigidity. No Cyanosis, Clubbing or edema, No new Rash or bruise      Data Review:    CBC Recent Labs  Lab 03/20/21  0020 03/20/21 0127  WBC 12.0*  --   HGB 7.9* 7.1*  HCT 24.6* 21.0*  PLT 288  --   MCV 88.8  --   MCH 28.5  --   MCHC 32.1  --   RDW 14.2  --   LYMPHSABS 2.1  --   MONOABS 0.8  --   EOSABS 0.2  --   BASOSABS 0.1  --     Recent Labs  Lab 03/20/21 0020 03/20/21 0102 03/20/21 0127 03/20/21 0735 03/20/21 0739  NA 135  --  137 137  --   K 4.4  --  4.3 4.2  --   CL 106  --  108 109  --   CO2 15*  --   --  18*  --   GLUCOSE 337*  --  308* 176*  --   BUN 91*  --  110* 98*  --   CREATININE 3.83*  --  4.10* 3.72*  --   CALCIUM 8.7*  --   --  8.8*  --   AST 10*  --   --  9*  --   ALT 9  --   --  8  --   ALKPHOS 54  --   --  50  --   BILITOT 0.4  --   --  0.9  --   ALBUMIN 3.3*  --   --  3.3*  --   MG  --   --   --  2.3  --   LATICACIDVEN  --  2.5*  --   --  1.0  INR 1.1  --   --  1.1  --      ------------------------------------------------------------------------------------------------------------------ No results for input(s): CHOL, HDL, LDLCALC, TRIG, CHOLHDL, LDLDIRECT in the last 72 hours.  Lab Results  Component Value Date   HGBA1C 6.5 (H) 12/25/2020   ------------------------------------------------------------------------------------------------------------------ No results for input(s): TSH, T4TOTAL, T3FREE, THYROIDAB in the last 72 hours.  Invalid input(s): FREET3  Cardiac Enzymes No results for input(s): CKMB, TROPONINI, MYOGLOBIN in the last 168 hours.  Invalid input(s): CK ------------------------------------------------------------------------------------------------------------------    Component Value Date/Time   BNP 1,409.0 (H) 12/31/2019 1300     Radiology Reports DG Chest Portable 1 View  Result Date: 03/20/2021 CLINICAL DATA:  Recent EGD. Low suspicion for perforation. Vomiting blood. EXAM: PORTABLE CHEST 1 VIEW COMPARISON:  04/28/2020 FINDINGS: Postoperative changes in the mediastinum. Heart size is normal. Some prominence of the hila, likely representing central vascular prominence. No airspace disease or consolidation in the lungs. No free air demonstrated under the hemidiaphragms. No pneumothorax. No pleural effusions. Mediastinal contours appear intact. IMPRESSION: No evidence of active pulmonary disease. Electronically Signed   By: Lucienne Capers M.D.   On: 03/20/2021 00:55

## 2021-03-20 NOTE — ED Notes (Signed)
RN attempted to place IV twice unsuccessful. Notified EDP and consult IV team.

## 2021-03-20 NOTE — Consult Note (Signed)
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  He was feeling well until later in the evening when he was going to go to sleep.  At that time he spontaneously started to experience coffee-ground emesis.  It progressively worsened and he presented to the ER.His HGB upon admission was at 7.1 g/dL and it dropped down to 7.1 g/dL with IV hydration.  This value was a drop from a recent value of 11.6 g/dL on 02/18/2021.  He has a large range for his HGB, but recently it was in the 11-13 range.  He was first diagnosed with Barrett's esophagus by Dr. Laural Golden on 10/2020 when he complained about GERD.  Biopsies at that time showed LGD and he was recommended a follow up EGD on 02/24/2021.  His pantoprazole was increased to BID and he symptomatically improved.  The repeat EGD showed a focal intramucosal adenocarcinoma and HGD as well as LGD.  He was referred to [facility] and an EMR was performed on 9/39/2022.  It was performed without immediate complications and he maintained his BID pantoprazole.  Currently he denies any chest pain.  Past Medical History:  Diagnosis Date   Arthritis    hips shoulders   CAD (coronary artery disease) 03/03/2019   CHF (congestive heart failure) ([place]) 2020   Chronic combined systolic and diastolic heart failure ([place]) 04/26/2018   Admitted 11/14-11/20/19-diuresed 10L   Fatigue 10/14/2020   Hypertension    Myocardial infarction [facility])    Sleep apnea    Type 2 diabetes mellitus with diabetic nephropathy ([place]) 04/23/2019   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    Past Surgical History:  Procedure Laterality Date   BIOPSY  06/27/2019   Procedure: BIOPSY;  Surgeon: Rogene Houston, MD;  [facility];  Service:  Endoscopy;;  ascending colon polyp   BIOPSY  10/21/2020   Procedure: BIOPSY;  Surgeon: Rogene Houston, MD;  [facility];  Service: Endoscopy;;  esophageal,gastric polyp   BIOPSY  02/24/2021   Procedure: BIOPSY;  Surgeon: Rogene Houston, MD;  [facility];  Service: Endoscopy;;  distal and proximal esophageal biopsies    CARDIAC SURGERY     CATARACT EXTRACTION     COLONOSCOPY N/A 06/27/2019   [name]:Diverticulosis in the entire examined colon. tubular adenoma in ascending colon, 2mm tubular adenoma in prox sigmoid. external hemorrhoids   CORONARY ARTERY BYPASS GRAFT N/A 03/25/2019   Procedure: CORONARY ARTERY BYPASS GRAFTING (CABG) X  4 USING LEFT INTERNAL MAMMARY ARTERY AND RIGHT SAPHENOUS VEIN GRAFTS;  Surgeon: [name], MD;  Location: [place];  Service: Open Heart Surgery;  Laterality: N/A;   ESOPHAGOGASTRODUODENOSCOPY (EGD) WITH PROPOFOL N/A 10/21/2020   rehman:Normal hypopharynx.Normal proximal esophagus and mid esophagus.Esophageal mucosal changes consistent with long-segment Barrett's esophagus. (Focal low-grade dysplasia and atypia proximally) z line irregular 37 cm from incisors, 3 cm HH, single gastric polyp (fundic gland) normal duodenal bulb/second portion of duodenum, proximal margin of Barrett's at 34 cm   ESOPHAGOGASTRODUODENOSCOPY (EGD) WITH PROPOFOL N/A 02/24/2021   Procedure: ESOPHAGOGASTRODUODENOSCOPY (EGD) WITH PROPOFOL;  Surgeon: Rogene Houston, MD;  [facility];  Service: Endoscopy;  Laterality: N/A;  1:35   EXCISION MORTON'S NEUROMA     EYE SURGERY  Left    retina   POLYPECTOMY  06/27/2019   Procedure: POLYPECTOMY;  Surgeon: Rogene Houston, MD;  [facility];  Service: Endoscopy;;  proximal sigmoid colon   RIGHT/LEFT HEART CATH AND CORONARY ANGIOGRAPHY N/A 03/12/2019   Procedure: RIGHT/LEFT HEART CATH AND CORONARY ANGIOGRAPHY;  Surgeon: [name], MD;  Location: [facility];  Service:  Cardiovascular;  Laterality: N/A;   SHOULDER ARTHROSCOPY WITH ROTATOR CUFF REPAIR AND SUBACROMIAL DECOMPRESSION Right 08/26/2020   Procedure: RIGHT SHOULDER MINI OPEN ROTATOR CUFF REPAIR AND SUBACROMIAL DECOMPRESSION WITH PATCH GRAFT;  Surgeon: Susa Day, MD;  Location: [facility];  Service: Orthopedics;  Laterality: Right;  90 MINS GENERAL WITH BLOCK   TEE WITHOUT CARDIOVERSION N/A 03/25/2019   Procedure: TRANSESOPHAGEAL ECHOCARDIOGRAM (TEE);  Surgeon: [name], MD;  Location: [place];  Service: Open Heart Surgery;  Laterality: N/A;    Family History  Problem Relation Age of Onset   Colon cancer Mother    Heart Problems Father    Diabetes Father    Valvular heart disease Father     Social History:  reports that he quit smoking about 32 years ago. His smoking use included cigarettes. He has never used smokeless tobacco. He reports that he does not currently use alcohol. He reports that he does not use drugs.  Allergies:  Allergies  Allergen Reactions   Bee Venom Anaphylaxis   Atorvastatin     myalgia   Diltiazem Itching   Rosuvastatin     myalgias   Valsartan Itching    Medications: Scheduled:  sodium chloride   Intravenous Once   carvedilol  6.25 mg Oral BID   furosemide  20 mg Oral Daily   insulin aspart  0-9 Units Subcutaneous Q6H   isosorbide-hydrALAZINE  1 tablet Oral TID   sodium chloride flush  3 mL Intravenous Q12H   Continuous:  sodium chloride     sodium chloride 100 mL/hr at 03/20/21 0616   pantoprazole 8 mg/hr (03/20/21 0621)    Results for orders placed or performed during the hospital encounter of 03/20/21 (from the past 24 hour(s))  Type and screen [place]     Status: None (Preliminary result)   Collection Time: 03/20/21 12:17 AM  Result Value Ref Range   ABO/RH(D) B POS    Antibody Screen NEG    Sample Expiration      03/23/2021,2359 Performed at [facility], [place] [address] [place], [place] [address]    Unit  Number [id number]    Blood Component Type RED CELLS,LR    Unit division 00    Status of Unit ISSUED    Transfusion Status OK TO TRANSFUSE    Crossmatch Result Compatible    Unit Number [id number]    Blood Component Type RED CELLS,LR    Unit division 00    Status of Unit ALLOCATED    Transfusion Status OK TO TRANSFUSE    Crossmatch Result Compatible   Comprehensive metabolic panel     Status: Abnormal   Collection Time: 03/20/21 12:20 AM  Result Value Ref Range   Sodium 135 135 - 145 mmol/L   Potassium 4.4 3.5 - 5.1 mmol/L   Chloride 106 98 - 111 mmol/L   CO2 15 (L) 22 - 32 mmol/L   Glucose, Bld 337 (H) 70 - 99 mg/dL   BUN 91 (H) 8 - 23 mg/dL   Creatinine, Ser 3.83 (H) 0.61 - 1.24 mg/dL   Calcium 8.7 (L)  8.9 - 10.3 mg/dL   Total Protein 5.7 (L) 6.5 - 8.1 g/dL   Albumin 3.3 (L) 3.5 - 5.0 g/dL   AST 10 (L) 15 - 41 U/L   ALT 9 0 - 44 U/L   Alkaline Phosphatase 54 38 - 126 U/L   Total Bilirubin 0.4 0.3 - 1.2 mg/dL   GFR, Estimated 17 (L) >60 mL/min   Anion gap 14 5 - 15  CBC with Differential/Platelet     Status: Abnormal   Collection Time: 03/20/21 12:20 AM  Result Value Ref Range   WBC 12.0 (H) 4.0 - 10.5 K/uL   RBC 2.77 (L) 4.22 - 5.81 MIL/uL   Hemoglobin 7.9 (L) 13.0 - 17.0 g/dL   HCT 24.6 (L) 39.0 - 52.0 %   MCV 88.8 80.0 - 100.0 fL   MCH 28.5 26.0 - 34.0 pg   MCHC 32.1 30.0 - 36.0 g/dL   RDW 14.2 11.5 - 15.5 %   Platelets 288 150 - 400 K/uL   nRBC 0.0 0.0 - 0.2 %   Neutrophils Relative % 73 %   Neutro Abs 8.8 (H) 1.7 - 7.7 K/uL   Lymphocytes Relative 17 %   Lymphs Abs 2.1 0.7 - 4.0 K/uL   Monocytes Relative 7 %   Monocytes Absolute 0.8 0.1 - 1.0 K/uL   Eosinophils Relative 1 %   Eosinophils Absolute 0.2 0.0 - 0.5 K/uL   Basophils Relative 1 %   Basophils Absolute 0.1 0.0 - 0.1 K/uL   Immature Granulocytes 1 %   Abs Immature Granulocytes 0.06 0.00 - 0.07 K/uL  Protime-INR     Status: None   Collection Time: 03/20/21 12:20 AM  Result Value Ref Range    Prothrombin Time 14.0 11.4 - 15.2 seconds   INR 1.1 0.8 - 1.2  Lipase, blood     Status: Abnormal   Collection Time: 03/20/21  1:02 AM  Result Value Ref Range   Lipase 59 (H) 11 - 51 U/L  Lactic acid, plasma     Status: Abnormal   Collection Time: 03/20/21  1:02 AM  Result Value Ref Range   Lactic Acid, Venous 2.5 (HH) 0.5 - 1.9 mmol/L  Resp Panel by RT-PCR (Flu A&B, Covid) Nasopharyngeal Swab     Status: None   Collection Time: 03/20/21  1:06 AM   Specimen: Nasopharyngeal Swab; Nasopharyngeal(NP) swabs in vial transport medium  Result Value Ref Range   SARS Coronavirus 2 by RT PCR NEGATIVE NEGATIVE   Influenza A by PCR NEGATIVE NEGATIVE   Influenza B by PCR NEGATIVE NEGATIVE  I-Stat Chem 8, ED     Status: Abnormal   Collection Time: 03/20/21  1:27 AM  Result Value Ref Range   Sodium 137 135 - 145 mmol/L   Potassium 4.3 3.5 - 5.1 mmol/L   Chloride 108 98 - 111 mmol/L   BUN 110 (H) 8 - 23 mg/dL   Creatinine, Ser 4.10 (H) 0.61 - 1.24 mg/dL   Glucose, Bld 308 (H) 70 - 99 mg/dL   Calcium, Ion 1.16 1.15 - 1.40 mmol/L   TCO2 18 (L) 22 - 32 mmol/L   Hemoglobin 7.1 (L) 13.0 - 17.0 g/dL   HCT 21.0 (L) 39.0 - 52.0 %  Prepare RBC (crossmatch)     Status: None   Collection Time: 03/20/21  2:10 AM  Result Value Ref Range   Order Confirmation      ORDER PROCESSED BY BLOOD BANK Performed at [facility], [address] [place], [place] [address]   Prepare RBC (crossmatch)     Status: None   Collection Time: 03/20/21  3:21 AM  Result Value Ref Range   Order Confirmation      ORDER PROCESSED BY BLOOD BANK Performed at [facility], [place] [address], [place], [place] [address]   Urinalysis, Complete w Microscopic Urine, Clean Catch     Status: Abnormal   Collection Time: 03/20/21  3:28 AM  Result Value Ref Range   Color, Urine STRAW (A) YELLOW   APPearance CLEAR CLEAR   Specific Gravity, Urine 1.012 1.005 - 1.030   pH 5.0 5.0 - 8.0   Glucose, UA 50 (A) NEGATIVE mg/dL    Hgb urine dipstick NEGATIVE NEGATIVE   Bilirubin Urine NEGATIVE NEGATIVE   Ketones, ur NEGATIVE NEGATIVE mg/dL   Protein, ur 100 (A) NEGATIVE mg/dL   Nitrite NEGATIVE NEGATIVE   Leukocytes,Ua NEGATIVE NEGATIVE   RBC / HPF 0-5 0 - 5 RBC/hpf   WBC, UA 0-5 0 - 5 WBC/hpf   Bacteria, UA NONE SEEN NONE SEEN  Sodium, urine, random     Status: None   Collection Time: 03/20/21  3:28 AM  Result Value Ref Range   Sodium, Ur 78 mmol/L  Creatinine, urine, random     Status: None   Collection Time: 03/20/21  3:28 AM  Result Value Ref Range   Creatinine, Urine 49.97 mg/dL  CBG monitoring, ED     Status: Abnormal   Collection Time: 03/20/21  6:23 AM  Result Value Ref Range   Glucose-Capillary 196 (H) 70 - 99 mg/dL  Reticulocytes     Status: Abnormal   Collection Time: 03/20/21  7:34 AM  Result Value Ref Range   Retic Ct Pct 1.7 0.4 - 3.1 %   RBC. 2.99 (L) 4.22 - 5.81 MIL/uL   Retic Count, Absolute 50.2 19.0 - 186.0 K/uL   Immature Retic Fract 15.9 2.3 - 15.9 %  HIV Antibody (routine testing w rflx)     Status: None   Collection Time: 03/20/21  7:35 AM  Result Value Ref Range   HIV Screen 4th Generation wRfx Non Reactive Non Reactive  Magnesium     Status: None   Collection Time: 03/20/21  7:35 AM  Result Value Ref Range   Magnesium 2.3 1.7 - 2.4 mg/dL  Comprehensive metabolic panel     Status: Abnormal   Collection Time: 03/20/21  7:35 AM  Result Value Ref Range   Sodium 137 135 - 145 mmol/L   Potassium 4.2 3.5 - 5.1 mmol/L   Chloride 109 98 - 111 mmol/L   CO2 18 (L) 22 - 32 mmol/L   Glucose, Bld 176 (H) 70 - 99 mg/dL   BUN 98 (H) 8 - 23 mg/dL   Creatinine, Ser 3.72 (H) 0.61 - 1.24 mg/dL   Calcium 8.8 (L) 8.9 - 10.3 mg/dL   Total Protein 5.8 (L) 6.5 - 8.1 g/dL   Albumin 3.3 (L) 3.5 - 5.0 g/dL   AST 9 (L) 15 - 41 U/L   ALT 8 0 - 44 U/L   Alkaline Phosphatase 50 38 - 126 U/L   Total Bilirubin 0.9 0.3 - 1.2 mg/dL   GFR, Estimated 17 (L) >60 mL/min   Anion gap 10 5 - 15   Protime-INR     Status: None   Collection Time: 03/20/21  7:35 AM  Result Value Ref Range   Prothrombin Time 14.4 11.4 - 15.2 seconds   INR 1.1  0.8 - 1.2  Iron and TIBC     Status: Abnormal   Collection Time: 03/20/21  7:35 AM  Result Value Ref Range   Iron 157 45 - 182 ug/dL   TIBC 234 (L) 250 - 450 ug/dL   Saturation Ratios 67 (H) 17.9 - 39.5 %   UIBC 77 ug/dL  Ferritin     Status: None   Collection Time: 03/20/21  7:35 AM  Result Value Ref Range   Ferritin 336 24 - 336 ng/mL  Folate     Status: None   Collection Time: 03/20/21  7:38 AM  Result Value Ref Range   Folate 7.2 >5.9 ng/mL  Lactic acid, plasma     Status: None   Collection Time: 03/20/21  7:39 AM  Result Value Ref Range   Lactic Acid, Venous 1.0 0.5 - 1.9 mmol/L  Hemoglobin and hematocrit, blood     Status: Abnormal   Collection Time: 03/20/21 10:26 AM  Result Value Ref Range   Hemoglobin 8.2 (L) 13.0 - 17.0 g/dL   HCT 24.6 (L) 39.0 - 52.0 %  CBC     Status: Abnormal   Collection Time: 03/20/21 10:48 AM  Result Value Ref Range   WBC 9.6 4.0 - 10.5 K/uL   RBC 2.93 (L) 4.22 - 5.81 MIL/uL   Hemoglobin 8.2 (L) 13.0 - 17.0 g/dL   HCT 25.0 (L) 39.0 - 52.0 %   MCV 85.3 80.0 - 100.0 fL   MCH 28.0 26.0 - 34.0 pg   MCHC 32.8 30.0 - 36.0 g/dL   RDW 15.9 (H) 11.5 - 15.5 %   Platelets 230 150 - 400 K/uL   nRBC 0.0 0.0 - 0.2 %  CBG monitoring, ED     Status: Abnormal   Collection Time: 03/20/21 11:28 AM  Result Value Ref Range   Glucose-Capillary 116 (H) 70 - 99 mg/dL  Hemoglobin and hematocrit, blood     Status: Abnormal   Collection Time: 03/20/21  1:00 PM  Result Value Ref Range   Hemoglobin 8.5 (L) 13.0 - 17.0 g/dL   HCT 26.0 (L) 39.0 - 52.0 %     DG Chest Portable 1 View  Result Date: 03/20/2021 CLINICAL DATA:  Recent EGD. Low suspicion for perforation. Vomiting blood. EXAM: PORTABLE CHEST 1 VIEW COMPARISON:  04/28/2020 FINDINGS: Postoperative changes in the mediastinum. Heart size is normal. Some  prominence of the hila, likely representing central vascular prominence. No airspace disease or consolidation in the lungs. No free air demonstrated under the hemidiaphragms. No pneumothorax. No pleural effusions. Mediastinal contours appear intact. IMPRESSION: No evidence of active pulmonary disease. Electronically Signed   By: Lucienne Capers M.D.   On: 03/20/2021 00:55    ROS:  As stated above in the HPI otherwise negative.  Blood pressure (!) 159/93, pulse 90, temperature 98.1 F (36.7 C), temperature source Oral, resp. rate 17, height 5\' 10"  (2.010 m), weight 98 kg, SpO2 99 %.    PE: Gen: NAD, Alert and Oriented HEENT:  [place]/AT, EOMI Neck: Supple, no LAD Lungs: CTA Bilaterally CV: RRR without M/G/R ABD: Soft, NTND, +BS Ext: No C/C/E  Assessment/Plan: 1) Focal intramucosal adenocarcinoma s/p EMR on 03/12/2021. 2) Hematemesis. 3) Anemia.   It is likely that he is bleeding from the EMR site.  He is currently hemodynamically stable.  An EGD will be performed to assess the resection site and address any bleeding issue.  Plan: 1) EGD tomorrow. 2) Agree with the blood transfusions. 3) Follow HGB. 4)  Agree with the IV prantoprazole.  Gamble Enderle D 03/20/2021, 3:09 PM

## 2021-03-21 ENCOUNTER — Encounter (HOSPITAL_COMMUNITY): Payer: Self-pay | Admitting: Internal Medicine

## 2021-03-21 ENCOUNTER — Observation Stay (HOSPITAL_COMMUNITY): Payer: Medicare HMO | Admitting: Certified Registered Nurse Anesthetist

## 2021-03-21 ENCOUNTER — Encounter (HOSPITAL_COMMUNITY)
Admission: EM | Disposition: A | Discharge status: Home / Self Care | Payer: Self-pay | Source: Home / Self Care | Attending: Internal Medicine

## 2021-03-21 DIAGNOSIS — E876 Hypokalemia: Secondary | ICD-10-CM | POA: Diagnosis not present

## 2021-03-21 DIAGNOSIS — H5461 Unqualified visual loss, right eye, normal vision left eye: Secondary | ICD-10-CM | POA: Diagnosis not present

## 2021-03-21 DIAGNOSIS — K449 Diaphragmatic hernia without obstruction or gangrene: Secondary | ICD-10-CM | POA: Diagnosis not present

## 2021-03-21 DIAGNOSIS — E669 Obesity, unspecified: Secondary | ICD-10-CM | POA: Diagnosis not present

## 2021-03-21 DIAGNOSIS — E1159 Type 2 diabetes mellitus with other circulatory complications: Secondary | ICD-10-CM | POA: Diagnosis not present

## 2021-03-21 DIAGNOSIS — Z7982 Long term (current) use of aspirin: Secondary | ICD-10-CM | POA: Diagnosis not present

## 2021-03-21 DIAGNOSIS — I959 Hypotension, unspecified: Secondary | ICD-10-CM | POA: Diagnosis not present

## 2021-03-21 DIAGNOSIS — K922 Gastrointestinal hemorrhage, unspecified: Secondary | ICD-10-CM | POA: Diagnosis not present

## 2021-03-21 DIAGNOSIS — K22711 Barrett's esophagus with high grade dysplasia: Secondary | ICD-10-CM | POA: Diagnosis not present

## 2021-03-21 DIAGNOSIS — Z20822 Contact with and (suspected) exposure to covid-19: Secondary | ICD-10-CM | POA: Diagnosis not present

## 2021-03-21 DIAGNOSIS — K2211 Ulcer of esophagus with bleeding: Secondary | ICD-10-CM | POA: Diagnosis not present

## 2021-03-21 DIAGNOSIS — K31811 Angiodysplasia of stomach and duodenum with bleeding: Secondary | ICD-10-CM | POA: Diagnosis not present

## 2021-03-21 DIAGNOSIS — I132 Hypertensive heart and chronic kidney disease with heart failure and with stage 5 chronic kidney disease, or end stage renal disease: Secondary | ICD-10-CM | POA: Diagnosis not present

## 2021-03-21 DIAGNOSIS — I252 Old myocardial infarction: Secondary | ICD-10-CM | POA: Diagnosis not present

## 2021-03-21 DIAGNOSIS — I2510 Atherosclerotic heart disease of native coronary artery without angina pectoris: Secondary | ICD-10-CM | POA: Diagnosis not present

## 2021-03-21 DIAGNOSIS — E1122 Type 2 diabetes mellitus with diabetic chronic kidney disease: Secondary | ICD-10-CM | POA: Diagnosis not present

## 2021-03-21 DIAGNOSIS — G4733 Obstructive sleep apnea (adult) (pediatric): Secondary | ICD-10-CM | POA: Diagnosis not present

## 2021-03-21 DIAGNOSIS — I5042 Chronic combined systolic (congestive) and diastolic (congestive) heart failure: Secondary | ICD-10-CM | POA: Diagnosis not present

## 2021-03-21 DIAGNOSIS — E872 Acidosis, unspecified: Secondary | ICD-10-CM | POA: Diagnosis not present

## 2021-03-21 DIAGNOSIS — D509 Iron deficiency anemia, unspecified: Secondary | ICD-10-CM | POA: Diagnosis not present

## 2021-03-21 DIAGNOSIS — D72829 Elevated white blood cell count, unspecified: Secondary | ICD-10-CM | POA: Diagnosis not present

## 2021-03-21 DIAGNOSIS — I459 Conduction disorder, unspecified: Secondary | ICD-10-CM | POA: Diagnosis not present

## 2021-03-21 DIAGNOSIS — K219 Gastro-esophageal reflux disease without esophagitis: Secondary | ICD-10-CM | POA: Diagnosis not present

## 2021-03-21 DIAGNOSIS — K2270 Barrett's esophagus without dysplasia: Secondary | ICD-10-CM | POA: Diagnosis not present

## 2021-03-21 DIAGNOSIS — N179 Acute kidney failure, unspecified: Secondary | ICD-10-CM | POA: Diagnosis not present

## 2021-03-21 DIAGNOSIS — N185 Chronic kidney disease, stage 5: Secondary | ICD-10-CM | POA: Diagnosis not present

## 2021-03-21 DIAGNOSIS — C159 Malignant neoplasm of esophagus, unspecified: Secondary | ICD-10-CM | POA: Diagnosis not present

## 2021-03-21 DIAGNOSIS — D62 Acute posthemorrhagic anemia: Secondary | ICD-10-CM | POA: Diagnosis not present

## 2021-03-21 DIAGNOSIS — K2210 Ulcer of esophagus without bleeding: Secondary | ICD-10-CM | POA: Diagnosis not present

## 2021-03-21 DIAGNOSIS — K920 Hematemesis: Secondary | ICD-10-CM | POA: Diagnosis not present

## 2021-03-21 DIAGNOSIS — Z6831 Body mass index (BMI) 31.0-31.9, adult: Secondary | ICD-10-CM | POA: Diagnosis not present

## 2021-03-21 HISTORY — PX: ESOPHAGOGASTRODUODENOSCOPY (EGD) WITH PROPOFOL: SHX5813

## 2021-03-21 LAB — CBC WITH DIFFERENTIAL/PLATELET
Abs Immature Granulocytes: 0.03 10*3/uL (ref 0.00–0.07)
Basophils Absolute: 0.1 10*3/uL (ref 0.0–0.1)
Basophils Relative: 1 %
Eosinophils Absolute: 0.2 10*3/uL (ref 0.0–0.5)
Eosinophils Relative: 2 %
HCT: 23 % — ABNORMAL LOW (ref 39.0–52.0)
Hemoglobin: 7.9 g/dL — ABNORMAL LOW (ref 13.0–17.0)
Immature Granulocytes: 0 %
Lymphocytes Relative: 27 %
Lymphs Abs: 2.3 10*3/uL (ref 0.7–4.0)
MCH: 28.3 pg (ref 26.0–34.0)
MCHC: 34.3 g/dL (ref 30.0–36.0)
MCV: 82.4 fL (ref 80.0–100.0)
Monocytes Absolute: 0.6 10*3/uL (ref 0.1–1.0)
Monocytes Relative: 7 %
Neutro Abs: 5.3 10*3/uL (ref 1.7–7.7)
Neutrophils Relative %: 63 %
Platelets: 205 10*3/uL (ref 150–400)
RBC: 2.79 MIL/uL — ABNORMAL LOW (ref 4.22–5.81)
RDW: 15.8 % — ABNORMAL HIGH (ref 11.5–15.5)
WBC: 8.4 10*3/uL (ref 4.0–10.5)
nRBC: 0 % (ref 0.0–0.2)

## 2021-03-21 LAB — COMPREHENSIVE METABOLIC PANEL
ALT: 8 U/L (ref 0–44)
AST: 8 U/L — ABNORMAL LOW (ref 15–41)
Albumin: 3 g/dL — ABNORMAL LOW (ref 3.5–5.0)
Alkaline Phosphatase: 44 U/L (ref 38–126)
Anion gap: 9 (ref 5–15)
BUN: 85 mg/dL — ABNORMAL HIGH (ref 8–23)
CO2: 16 mmol/L — ABNORMAL LOW (ref 22–32)
Calcium: 8.4 mg/dL — ABNORMAL LOW (ref 8.9–10.3)
Chloride: 112 mmol/L — ABNORMAL HIGH (ref 98–111)
Creatinine, Ser: 4 mg/dL — ABNORMAL HIGH (ref 0.61–1.24)
GFR, Estimated: 16 mL/min — ABNORMAL LOW (ref >60)
Glucose, Bld: 135 mg/dL — ABNORMAL HIGH (ref 70–99)
Potassium: 3.4 mmol/L — ABNORMAL LOW (ref 3.5–5.1)
Sodium: 137 mmol/L (ref 135–145)
Total Bilirubin: 0.7 mg/dL (ref 0.3–1.2)
Total Protein: 5.3 g/dL — ABNORMAL LOW (ref 6.5–8.1)

## 2021-03-21 LAB — CBC
HCT: 23.1 % — ABNORMAL LOW (ref 39.0–52.0)
Hemoglobin: 7.9 g/dL — ABNORMAL LOW (ref 13.0–17.0)
MCH: 28.4 pg (ref 26.0–34.0)
MCHC: 34.2 g/dL (ref 30.0–36.0)
MCV: 83.1 fL (ref 80.0–100.0)
Platelets: 213 10*3/uL (ref 150–400)
RBC: 2.78 MIL/uL — ABNORMAL LOW (ref 4.22–5.81)
RDW: 15.9 % — ABNORMAL HIGH (ref 11.5–15.5)
WBC: 7.4 10*3/uL (ref 4.0–10.5)
nRBC: 0 % (ref 0.0–0.2)

## 2021-03-21 LAB — GLUCOSE, CAPILLARY
Glucose-Capillary: 117 mg/dL — ABNORMAL HIGH (ref 70–99)
Glucose-Capillary: 122 mg/dL — ABNORMAL HIGH (ref 70–99)
Glucose-Capillary: 130 mg/dL — ABNORMAL HIGH (ref 70–99)
Glucose-Capillary: 151 mg/dL — ABNORMAL HIGH (ref 70–99)
Glucose-Capillary: 153 mg/dL — ABNORMAL HIGH (ref 70–99)
Glucose-Capillary: 162 mg/dL — ABNORMAL HIGH (ref 70–99)
Glucose-Capillary: 261 mg/dL — ABNORMAL HIGH (ref 70–99)

## 2021-03-21 LAB — MAGNESIUM: Magnesium: 2.1 mg/dL (ref 1.7–2.4)

## 2021-03-21 SURGERY — ESOPHAGOGASTRODUODENOSCOPY (EGD) WITH PROPOFOL
Anesthesia: Monitor Anesthesia Care

## 2021-03-21 MED ORDER — PROPOFOL 500 MG/50ML IV EMUL
INTRAVENOUS | Status: DC | PRN
  Administered 2021-03-21: 150 ug/kg/min via INTRAVENOUS

## 2021-03-21 MED ORDER — SODIUM CHLORIDE 0.9 % IV SOLN: INTRAVENOUS | Status: DC | PRN

## 2021-03-21 MED ORDER — POTASSIUM CHLORIDE CRYS ER 20 MEQ PO TBCR: 40.0000 meq | EXTENDED_RELEASE_TABLET | Freq: Once | ORAL | Status: DC

## 2021-03-21 MED ORDER — LIDOCAINE HCL (PF) 2 % IJ SOLN
INTRAMUSCULAR | Status: DC | PRN
  Administered 2021-03-21: 40 mg via INTRADERMAL

## 2021-03-21 MED ORDER — POTASSIUM CHLORIDE CRYS ER 20 MEQ PO TBCR
40.0000 meq | EXTENDED_RELEASE_TABLET | Freq: Once | ORAL | Status: AC
  Administered 2021-03-21: 40 meq via ORAL
  Filled 2021-03-21: qty 2

## 2021-03-21 MED ORDER — ONDANSETRON HCL 4 MG/2ML IJ SOLN: 4.0000 mg | Freq: Once | INTRAMUSCULAR | Status: DC | PRN

## 2021-03-21 MED ORDER — PHENYLEPHRINE 40 MCG/ML (10ML) SYRINGE FOR IV PUSH (FOR BLOOD PRESSURE SUPPORT)
PREFILLED_SYRINGE | INTRAVENOUS | Status: DC | PRN
  Administered 2021-03-21 (×2): 80 ug via INTRAVENOUS

## 2021-03-21 MED ORDER — AMISULPRIDE (ANTIEMETIC) 5 MG/2ML IV SOLN
10.0000 mg | Freq: Once | INTRAVENOUS | Status: DC | PRN
  Filled 2021-03-21: qty 4

## 2021-03-21 MED ORDER — PROPOFOL 10 MG/ML IV BOLUS
INTRAVENOUS | Status: DC | PRN
  Administered 2021-03-21 (×2): 10 mg via INTRAVENOUS

## 2021-03-21 MED ORDER — FUROSEMIDE 20 MG PO TABS: 20.0000 mg | ORAL_TABLET | Freq: Every day | ORAL | Status: DC

## 2021-03-21 SURGICAL SUPPLY — 14 items

## 2021-03-21 NOTE — Transfer of Care (Signed)
Immediate Anesthesia Transfer of Care Note  Patient: PHILOPATER MUCHA  Procedure(s) Performed: ESOPHAGOGASTRODUODENOSCOPY (EGD) WITH PROPOFOL  Patient Location: PACU  Anesthesia Type:MAC  Level of Consciousness: drowsy and patient cooperative  Airway & Oxygen Therapy: Patient Spontanous Breathing and Patient connected to nasal cannula oxygen  Post-op Assessment: Report given to RN and Post -op Vital signs reviewed and stable  Post vital signs: Reviewed and stable  Last Vitals:  Vitals Value Taken Time  BP 97/62 03/21/21 1330  Temp    Pulse 82 03/21/21 1332  Resp 18 03/21/21 1332  SpO2 100 % 03/21/21 1332  Vitals shown include unvalidated device data.  Last Pain:  Vitals:   03/21/21 1254  TempSrc: Temporal  PainSc: 0-No pain         Complications: No notable events documented.

## 2021-03-21 NOTE — Anesthesia Postprocedure Evaluation (Signed)
Anesthesia Post Note  Patient: [name]  Procedure(s) Performed: ESOPHAGOGASTRODUODENOSCOPY (EGD) WITH PROPOFOL     Patient location during evaluation: PACU Anesthesia Type: MAC Level of consciousness: awake and alert Pain management: pain level controlled Vital Signs Assessment: post-procedure vital signs reviewed and stable Respiratory status: spontaneous breathing, nonlabored ventilation, respiratory function stable and patient connected to nasal cannula oxygen Cardiovascular status: stable and blood pressure returned to baseline Postop Assessment: no apparent nausea or vomiting Anesthetic complications: no   No notable events documented.  Last Vitals:  Vitals:   03/21/21 1345 03/21/21 1400  BP: (!) 141/80 (!) 155/86  Pulse: 83 86  Resp: (!) 26 17  Temp:    SpO2: 100% 100%    Last Pain:  Vitals:   03/21/21 1400  TempSrc:   PainSc: 0-No pain                 [name]

## 2021-03-21 NOTE — Anesthesia Procedure Notes (Signed)
Procedure Name: MAC Date/Time: 03/21/2021 1:09 PM Performed by: Janene Harvey, CRNA Pre-anesthesia Checklist: Patient identified, Emergency Drugs available, Suction available and Patient being monitored Patient Re-evaluated:Patient Re-evaluated prior to induction Oxygen Delivery Method: Nasal cannula Induction Type: IV induction Placement Confirmation: positive ETCO2 Dental Injury: Teeth and Oropharynx as per pre-operative assessment

## 2021-03-21 NOTE — Progress Notes (Signed)
PROGRESS NOTE                                                                                                                                                                                                             Patient Demographics:    [name], is a 65 y.o. male, DOB - [birth year], DJM:[id number]  Outpatient Primary MD for the patient is [name], MD    LOS - 0  Admit date - 03/20/2021    Chief Complaint  Patient presents with   GI Bleeding       Brief Narrative (HPI from H&P)   [name] is a 65 y.o. male with medical history significant for recently diagnosed esophageal adenocarcinoma at [facility],  chronic combined systolic/diastolic heart failure, essential hypertension, type 2 diabetes mellitus, stage IV chronic kidney disease with baseline creatinine 3.0-3.6.  Who is admitted to [facility] on 03/20/2021 with acute upper GI bleed after presenting from home to [facility] ED complaining of hematemesis and some melena.   Subjective:    Newt Minion today has, No headache, No chest pain, No abdominal pain - No Nausea, No new weakness tingling or numbness, no SOB.   Assessment  & Plan :   UGI bleed with acute blood loss related anemia due to recently diagnosed submucosal esophageal adenocarcinoma and history of Barrett's esophagus - he is currently on IV PPI drip has received 2 units of packed RBC on 03/20/2021, continue to monitor H&H, have discussed the case with GI physician Dr. Benson Norway who will likely do EGD later on 03/21/2021, continue bowel rest monitor H&H and transfuse as needed goal of hemoglobin will be around 7.  Post discharge follow-up with his GI physician and surgeon at [facility]  2.  Mild AKI on top of underlying CKD stage IV.  Baseline creatinine 3.5, likely due to acute blood loss, hold ARB transfuse monitor renal function.    3.  Some anemia related hypoperfusion causing mild lactic  acidosis.  Clinically stable.  No signs of infection or sepsis  4. Obesity with OSA - BMI 31 - follow with PCP, nighttime BiPAP.   5.  Chronic combined systolic and EF 22% on echocardiogram done few months ago.  Currently compensated.  Low dose Lasix, Coreg and BiDil for now.  Monitor.  6.  Hypertension -  Meds as in #5.  7.  Recent right rotator cuff surgery about 1 month ago with some mild right shoulder weakness and right arm tingling numbness ongoing for a few weeks.  Stable.    8.  Hypokalemia.  Replaced.    9. DM2 - ISS  Lab Results  Component Value Date   HGBA1C 6.5 (H) 12/25/2020   CBG (last 3)  Recent Labs    03/21/21 0009 03/21/21 0606 03/21/21 0809  GLUCAP 117* 122* 130*        Condition -   Guarded  Family Communication  :  None  Code Status :  Full  Consults  :  GI  PUD Prophylaxis : PPI   Procedures  :            Disposition Plan  :    Status is: Observation  Dispo: The patient is from: Home              Anticipated d/c is to: Home              Patient currently is not medically stable to d/c.   Difficult to place patient No  DVT Prophylaxis  :    SCDs Start: 03/20/21 0258    Lab Results  Component Value Date   PLT 213 03/21/2021    Diet :  Diet Order             Diet NPO time specified Except for: Sips with Meds  Diet effective now                    Inpatient Medications  Scheduled Meds:  sodium chloride   Intravenous Once   carvedilol  6.25 mg Oral BID   [START ON 03/22/2021] furosemide  20 mg Oral Daily   insulin aspart  0-9 Units Subcutaneous Q6H   isosorbide-hydrALAZINE  1 tablet Oral TID   Continuous Infusions:  sodium chloride 20 mL/hr at 03/21/21 0424   pantoprazole 8 mg/hr (03/21/21 0942)   PRN Meds:.acetaminophen **OR** [DISCONTINUED] acetaminophen, ondansetron (ZOFRAN) IV  Antibiotics  :    Anti-infectives (From admission, onward)    None        Time Spent in minutes  30   Lala Lund  M.D on 03/21/2021 at 11:15 AM  To page go to [email or web address]   [facility] -  Office  [phone]  See all Orders from today for further details    Objective:   Vitals:   03/20/21 2203 03/21/21 0000 03/21/21 0400 03/21/21 0807  BP:  121/72 131/75 (!) 144/82  Pulse:  83 82 82  Resp:  16 19 18   Temp:  98.5 F (36.9 C) 97.7 F (36.5 C) 97.6 F (36.4 C)  TempSrc:  Oral Oral Oral  SpO2:  95% 96% 95%  Weight: 89.4 kg     Height: 5\' 11"  (1.803 m)       Wt Readings from Last 3 Encounters:  03/20/21 89.4 kg  02/23/21 92.7 kg  02/18/21 90.7 kg     Intake/Output Summary (Last 24 hours) at 03/21/2021 1115 Last data filed at 03/21/2021 0924 Gross per 24 hour  Intake 308.33 ml  Output 1500 ml  Net -1191.67 ml     Physical Exam  Awake Alert, No new F.N deficits, Normal affect [place].AT,PERRAL Supple Neck, No JVD,   Symmetrical Chest wall movement, Good air movement bilaterally, CTAB RRR,No Gallops, Rubs or new Murmurs, No Parasternal Heave +ve B.Sounds, Abd Soft, No tenderness,   No  Cyanosis, Clubbing or edema,      Data Review:    CBC Recent Labs  Lab 03/20/21 0020 03/20/21 0127 03/20/21 1048 03/20/21 1300 03/20/21 1600 03/21/21 0128 03/21/21 0711  WBC 12.0*  --  9.6  --  10.2 8.4 7.4  HGB 7.9*   < > 8.2* 8.5* 7.1* 7.9* 7.9*  HCT 24.6*   < > 25.0* 26.0* 22.0* 23.0* 23.1*  PLT 288  --  230  --  225 205 213  MCV 88.8  --  85.3  --  85.6 82.4 83.1  MCH 28.5  --  28.0  --  27.6 28.3 28.4  MCHC 32.1  --  32.8  --  32.3 34.3 34.2  RDW 14.2  --  15.9*  --  16.3* 15.8* 15.9*  LYMPHSABS 2.1  --   --   --   --  2.3  --   MONOABS 0.8  --   --   --   --  0.6  --   EOSABS 0.2  --   --   --   --  0.2  --   BASOSABS 0.1  --   --   --   --  0.1  --    < > = values in this interval not displayed.    Recent Labs  Lab 03/20/21 0020 03/20/21 0102 03/20/21 0127 03/20/21 0735 03/20/21 0739 03/21/21 0128  NA 135  --  137 137  --  137  K 4.4  --  4.3 4.2  --  3.4*   CL 106  --  108 109  --  112*  CO2 15*  --   --  18*  --  16*  GLUCOSE 337*  --  308* 176*  --  135*  BUN 91*  --  110* 98*  --  85*  CREATININE 3.83*  --  4.10* 3.72*  --  4.00*  CALCIUM 8.7*  --   --  8.8*  --  8.4*  AST 10*  --   --  9*  --  8*  ALT 9  --   --  8  --  8  ALKPHOS 54  --   --  50  --  44  BILITOT 0.4  --   --  0.9  --  0.7  ALBUMIN 3.3*  --   --  3.3*  --  3.0*  MG  --   --   --  2.3  --  2.1  LATICACIDVEN  --  2.5*  --   --  1.0  --   INR 1.1  --   --  1.1  --   --     ------------------------------------------------------------------------------------------------------------------ No results for input(s): CHOL, HDL, LDLCALC, TRIG, CHOLHDL, LDLDIRECT in the last 72 hours.  Lab Results  Component Value Date   HGBA1C 6.5 (H) 12/25/2020   ------------------------------------------------------------------------------------------------------------------ No results for input(s): TSH, T4TOTAL, T3FREE, THYROIDAB in the last 72 hours.  Invalid input(s): FREET3  Cardiac Enzymes No results for input(s): CKMB, TROPONINI, MYOGLOBIN in the last 168 hours.  Invalid input(s): CK ------------------------------------------------------------------------------------------------------------------    Component Value Date/Time   BNP 1,409.0 (H) 12/31/2019 1300     Radiology Reports DG Chest Portable 1 View  Result Date: 03/20/2021 CLINICAL DATA:  Recent EGD. Low suspicion for perforation. Vomiting blood. EXAM: PORTABLE CHEST 1 VIEW COMPARISON:  04/28/2020 FINDINGS: Postoperative changes in the mediastinum. Heart size is normal. Some prominence of the hila, likely representing central vascular prominence. No  airspace disease or consolidation in the lungs. No free air demonstrated under the hemidiaphragms. No pneumothorax. No pleural effusions. Mediastinal contours appear intact. IMPRESSION: No evidence of active pulmonary disease. Electronically Signed   By: Lucienne Capers  M.D.   On: 03/20/2021 00:55

## 2021-03-21 NOTE — Anesthesia Preprocedure Evaluation (Addendum)
Anesthesia Evaluation  Patient identified by MRN, date of birth, ID band Patient awake    Reviewed: Allergy & Precautions, NPO status , Patient's Chart, lab work & pertinent test results  Airway Mallampati: II  TM Distance: >3 FB Neck ROM: Full    Dental  (+) Teeth Intact, Dental Advisory Given   Pulmonary sleep apnea , former smoker,    breath sounds clear to auscultation       Cardiovascular hypertension, + CAD, + CABG, + Peripheral Vascular Disease and +CHF   Rhythm:Regular Rate:Normal  Echo: 1. Left ventricular ejection fraction, by estimation, is 45 to 50%. The  left ventricle has mildly decreased function. The left ventricle  demonstrates regional wall motion abnormalities (see scoring  diagram/findings for description). The left ventricular  internal cavity size was moderately to severely dilated. Left ventricular  diastolic parameters are consistent with Grade I diastolic dysfunction  (impaired relaxation). There is severe dyskinesis of the left ventricular,  basal-mid inferior wall.  2. Right ventricular systolic function is normal. The right ventricular  size is normal.  3. Left atrial size was severely dilated.  4. Right atrial size was mildly dilated.  5. The mitral valve is normal in structure. Mild mitral valve  regurgitation.    Neuro/Psych negative neurological ROS  negative psych ROS   GI/Hepatic Neg liver ROS, GERD  ,  Endo/Other  diabetes, Type 2  Renal/GU Renal disease     Musculoskeletal  (+) Arthritis ,   Abdominal Normal abdominal exam  (+)   Peds  Hematology   Anesthesia Other Findings   Reproductive/Obstetrics                            Anesthesia Physical Anesthesia Plan  ASA: 3  Anesthesia Plan: MAC   Post-op Pain Management:    Induction: Intravenous  PONV Risk Score and Plan: 0 and Propofol infusion  Airway Management Planned: Simple Face  Mask and Natural Airway  Additional Equipment: None  Intra-op Plan:   Post-operative Plan:   Informed Consent: I have reviewed the patients History and Physical, chart, labs and discussed the procedure including the risks, benefits and alternatives for the proposed anesthesia with the patient or authorized representative who has indicated his/her understanding and acceptance.       Plan Discussed with: CRNA  Anesthesia Plan Comments:        Anesthesia Quick Evaluation

## 2021-03-21 NOTE — Op Note (Signed)
[facility] Patient Name: [name] Procedure Date : 03/21/2021 MRN: [id number] Attending MD: Carol Ada , MD Date of Birth: January 31, 1956 CSN: [id number] Age: 65 Admit Type: Inpatient Procedure:                Upper GI endoscopy Indications:              Coffee-ground emesis Providers:                Carol Ada, MD, Brooke Person, Ladona Ridgel,                            Technician Referring MD:              Medicines:                Propofol per Anesthesia Complications:            No immediate complications. Estimated Blood Loss:     Estimated blood loss: none. Procedure:                Pre-Anesthesia Assessment:                           - Prior to the procedure, a History and Physical                            was performed, and patient medications and                            allergies were reviewed. The patient's tolerance of                            previous anesthesia was also reviewed. The risks                            and benefits of the procedure and the sedation                            options and risks were discussed with the patient.                            All questions were answered, and informed consent                            was obtained. Prior Anticoagulants: The patient has                            taken no previous anticoagulant or antiplatelet                            agents. ASA Grade Assessment: III - A patient with                            severe systemic disease. After reviewing the risks                            and  benefits, the patient was deemed in                            satisfactory condition to undergo the procedure.                           - Sedation was administered by an anesthesia                            professional. Deep sedation was attained.                           After obtaining informed consent, the endoscope was                            passed under direct vision. Throughout the                             procedure, the patient's blood pressure, pulse, and                            oxygen saturations were monitored continuously. The                            [id number]-H190 ([id number]) Olympus endoscope was introduced                            through the mouth, and advanced to the second part                            of duodenum. The upper GI endoscopy was                            accomplished without difficulty. The patient                            tolerated the procedure well. Scope In: Scope Out: Findings:      One superficial esophageal ulcer with no stigmata of recent bleeding was       found in the middle third of the esophagus. The lesion was 15 mm in       largest dimension.      Barrett's esophagus was present in the middle third of the esophagus and       in the lower third of the esophagus.      A 4 cm hiatal hernia was present.      A few diminutive angioectasias with bleeding were found in the gastric       body. Coagulation for hemostasis using monopolar probe was successful.       Estimated blood loss: none.      The examined duodenum was normal.      The bleeding was form the gastic lumen. Several punctate lesions were       identified and several were bleeding. All bleeding and suspicious sites       were ablated with APC. Impression:               -  Esophageal ulcer with no stigmata of recent                            bleeding.                           - Barrett's esophagus.                           - 4 cm hiatal hernia.                           - A few bleeding angioectasias in the stomach.                            Treated with a monopolar probe.                           - Normal examined duodenum.                           - No specimens collected. Recommendation:           - Return patient to hospital ward for ongoing care.                           - Resume regular diet.                           - Continue present  medications.                           - Follow HGB and transfuse if necessary.                           - [facility] will assume care in the AM. Procedure Code(s):        --- Professional ---                           [phone], Esophagogastroduodenoscopy, flexible,                            transoral; with control of bleeding, any method Diagnosis Code(s):        --- Professional ---                           K22.10, Ulcer of esophagus without bleeding                           K22.70, Barrett's esophagus without dysplasia                           K44.9, Diaphragmatic hernia without obstruction or                            gangrene  K31.811, Angiodysplasia of stomach and duodenum                            with bleeding                           K92.0, Hematemesis CPT copyright 2019 [employer]. All rights reserved. The codes documented in this report are preliminary and upon coder review may  be revised to meet current compliance requirements. Carol Ada, MD Carol Ada, MD 03/21/2021 1:30:07 PM This report has been signed electronically. Number of Addenda: 0

## 2021-03-22 DIAGNOSIS — K922 Gastrointestinal hemorrhage, unspecified: Secondary | ICD-10-CM

## 2021-03-22 LAB — GLUCOSE, CAPILLARY
Glucose-Capillary: 131 mg/dL — ABNORMAL HIGH (ref 70–99)
Glucose-Capillary: 162 mg/dL — ABNORMAL HIGH (ref 70–99)
Glucose-Capillary: 167 mg/dL — ABNORMAL HIGH (ref 70–99)
Glucose-Capillary: 181 mg/dL — ABNORMAL HIGH (ref 70–99)
Glucose-Capillary: 284 mg/dL — ABNORMAL HIGH (ref 70–99)

## 2021-03-22 LAB — PREPARE RBC (CROSSMATCH)

## 2021-03-22 LAB — CBC WITH DIFFERENTIAL/PLATELET
Abs Immature Granulocytes: 0.03 10*3/uL (ref 0.00–0.07)
Basophils Absolute: 0.1 10*3/uL (ref 0.0–0.1)
Basophils Relative: 1 %
Eosinophils Absolute: 0.3 10*3/uL (ref 0.0–0.5)
Eosinophils Relative: 4 %
HCT: 22.3 % — ABNORMAL LOW (ref 39.0–52.0)
Hemoglobin: 7.3 g/dL — ABNORMAL LOW (ref 13.0–17.0)
Immature Granulocytes: 0 %
Lymphocytes Relative: 26 %
Lymphs Abs: 1.8 10*3/uL (ref 0.7–4.0)
MCH: 27.5 pg (ref 26.0–34.0)
MCHC: 32.7 g/dL (ref 30.0–36.0)
MCV: 84.2 fL (ref 80.0–100.0)
Monocytes Absolute: 0.6 10*3/uL (ref 0.1–1.0)
Monocytes Relative: 9 %
Neutro Abs: 4.1 10*3/uL (ref 1.7–7.7)
Neutrophils Relative %: 60 %
Platelets: 218 10*3/uL (ref 150–400)
RBC: 2.65 MIL/uL — ABNORMAL LOW (ref 4.22–5.81)
RDW: 15.9 % — ABNORMAL HIGH (ref 11.5–15.5)
WBC: 6.8 10*3/uL (ref 4.0–10.5)
nRBC: 0 % (ref 0.0–0.2)

## 2021-03-22 LAB — COMPREHENSIVE METABOLIC PANEL
ALT: 9 U/L (ref 0–44)
AST: 10 U/L — ABNORMAL LOW (ref 15–41)
Albumin: 3 g/dL — ABNORMAL LOW (ref 3.5–5.0)
Alkaline Phosphatase: 44 U/L (ref 38–126)
Anion gap: 5 (ref 5–15)
BUN: 72 mg/dL — ABNORMAL HIGH (ref 8–23)
CO2: 20 mmol/L — ABNORMAL LOW (ref 22–32)
Calcium: 8.5 mg/dL — ABNORMAL LOW (ref 8.9–10.3)
Chloride: 113 mmol/L — ABNORMAL HIGH (ref 98–111)
Creatinine, Ser: 3.98 mg/dL — ABNORMAL HIGH (ref 0.61–1.24)
GFR, Estimated: 16 mL/min — ABNORMAL LOW (ref >60)
Glucose, Bld: 159 mg/dL — ABNORMAL HIGH (ref 70–99)
Potassium: 4 mmol/L (ref 3.5–5.1)
Sodium: 138 mmol/L (ref 135–145)
Total Bilirubin: 0.5 mg/dL (ref 0.3–1.2)
Total Protein: 5.3 g/dL — ABNORMAL LOW (ref 6.5–8.1)

## 2021-03-22 LAB — MAGNESIUM: Magnesium: 2.1 mg/dL (ref 1.7–2.4)

## 2021-03-22 LAB — METHYLMALONIC ACID, SERUM: Methylmalonic Acid, Quantitative: 847 nmol/L — ABNORMAL HIGH (ref 0–378)

## 2021-03-22 MED ORDER — SODIUM CHLORIDE 0.9% IV SOLUTION: Freq: Once | INTRAVENOUS | Status: AC

## 2021-03-22 MED ORDER — FUROSEMIDE 20 MG PO TABS
20.0000 mg | ORAL_TABLET | Freq: Every day | ORAL | Status: DC
  Administered 2021-03-23: 20 mg via ORAL
  Filled 2021-03-22: qty 1

## 2021-03-22 MED ORDER — FUROSEMIDE 10 MG/ML IJ SOLN
20.0000 mg | Freq: Once | INTRAMUSCULAR | Status: AC
  Administered 2021-03-22: 20 mg via INTRAVENOUS
  Filled 2021-03-22: qty 2

## 2021-03-22 NOTE — Progress Notes (Signed)
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  Esophageal cancer.  EGD with EMR 02/2021.  Has not had a bowel movement for 3 or so days.  Does not feel uncomfortable.  Tolerating solid food without problems.  OBJECTIVE:         Vital signs in last 24 hours:    Temp:  [97.2 F (36.2 C)-98.3 F (36.8 C)] 97.6 F (36.4 C) (10/10 0821) Pulse Rate:  [81-88] 82 (10/10 0821) Resp:  [14-26] 20 (10/10 0821) BP: (97-166)/(62-89) 126/70 (10/10 0821) SpO2:  [94 %-100 %] 95 % (10/10 0821) Weight:  [89.4 kg-91 kg] 91 kg (10/10 0353) Last BM Date: 03/19/21 Filed Weights   03/20/21 2203 03/21/21 1254 03/22/21 0353  Weight: 89.4 kg 89.4 kg 91 kg   General: Obese, looks chronically unwell but not acutely ill. Heart: RRR. Chest: Lungs clear bilaterally.  No labored breathing or cough Abdomen: Obese.  Active bowel sounds.  Not tender. Extremities: No CCE. Neuro/Psych: Oriented x3.  Moves all 4 limbs without gross tremor or weakness.  Intake/Output from previous day: 10/09 0701 - 10/10 0700 In: 546.7 [I.V.:546.7] Out: 1825 [Urine:1825]  Intake/Output this shift: Total I/O In: 240 [P.O.:240] Out: -   Lab Results: Recent Labs    03/21/21 0128 03/21/21 0711 03/22/21 0038  WBC 8.4 7.4 6.8  HGB 7.9* 7.9* 7.3*  HCT 23.0* 23.1* 22.3*  PLT 205 213 218   BMET Recent Labs    03/20/21 0735 03/21/21 0128 03/22/21 0038  NA 137 137 138  K 4.2 3.4* 4.0  CL 109 112* 113*  CO2 18* 16* 20*  GLUCOSE 176* 135* 159*  BUN 98* 85* 72*  CREATININE 3.72* 4.00* 3.98*  CALCIUM 8.8* 8.4* 8.5*   LFT Recent Labs    03/20/21 0735 03/21/21 0128 03/22/21 0038  PROT 5.8* 5.3* 5.3*  ALBUMIN 3.3* 3.0* 3.0*  AST 9* 8* 10*  ALT 8 8 9   ALKPHOS 50 44 44  BILITOT 0.9 0.7 0.5   PT/INR Recent Labs    03/20/21 0020 03/20/21 0735  LABPROT 14.0 14.4  INR 1.1 1.1    Hepatitis Panel No results for input(s): HEPBSAG, HCVAB, HEPAIGM, HEPBIGM in the last 72 hours.  Studies/Results: No results found.  Scheduled Meds:  sodium chloride   Intravenous Once   sodium chloride   Intravenous Once   carvedilol  6.25 mg Oral BID   furosemide  20 mg Intravenous Once   [START ON 03/23/2021] furosemide  20 mg Oral Daily   insulin aspart  0-9 Units Subcutaneous Q6H   isosorbide-hydrALAZINE  1 tablet Oral TID   Continuous Infusions:  pantoprazole 8 mg/hr (03/22/21 0430)   PRN Meds:.acetaminophen **OR** [DISCONTINUED] acetaminophen, ondansetron (ZOFRAN) IV   ASSESMENT:     GI bleeding w melena, hematemesis.  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  03/21/2021 EGD: Superficial, nonbleeding ulcer at mid esophagus (at site of EMR).  Mid and distal esophageal Barrett's.  4 cm hiatal hernia.  Bleeding, diminutive gastric AVMs treated with APC.  Dr. Adria [name] noted 10/5 pt's poorly differentiated cancer and cancer extending to deep margin of resection specimen with invasion to at least submucosa put patient at risk for residual disease.  He felt patient should be seen by  thoracic oncologist for consider of esophagectomy.  This appt has yet to be arranged.  Protonix gtt started 0620 on 10/8, 72 hours finish 0300 tomorrow 10/11.  Do not find recent CTAP, PET scan or other advanced imaging in [facility] or [facility] records.       Blood loss anemia.  3 PRBCs thus far.  Additional 1 PRBC ordered.  Hgb 7.1... 8.5... 7.1..  7.9 ... 7.3.     Stage 4, closing in on stage 5 CKD.     PLAN     Transfuse blood, 4th PRBC, as ordered.  ? Need for ongoing PPI gtt?  Follow up w Dr [name] who will be arranging fup w Dr [name] or other MD at thoracic oncology clinic.      Add prune juice (pt endorses this is efffective) for constipation, Miralax  not helpful for pt in past.      [name]  03/22/2021, 11:21 AM Phone [phone]

## 2021-03-22 NOTE — Progress Notes (Signed)
PROGRESS NOTE                                                                                                                                                                                                             Patient Demographics:    [name], is a 65 y.o. male, DOB - [birth year], QRF:[id number]  Outpatient Primary MD for the patient is [name], MD    LOS - 1  Admit date - 03/20/2021    Chief Complaint  Patient presents with   GI Bleeding       Brief Narrative (HPI from H&P)   [name] is a 65 y.o. male with medical history significant for recently diagnosed esophageal adenocarcinoma at [facility],  chronic combined systolic/diastolic heart failure, essential hypertension, type 2 diabetes mellitus, stage IV chronic kidney disease with baseline creatinine 3.0-3.6.  Who is admitted to [facility] on 03/20/2021 with acute upper GI bleed after presenting from home to [facility] ED complaining of hematemesis and some melena.   Subjective:   Patient in bed, appears comfortable, denies any headache, no fever, no chest pain or pressure, no shortness of breath , no abdominal pain. No new focal weakness.    Assessment  & Plan :   UGI bleed with acute blood loss related anemia due to recently diagnosed submucosal esophageal adenocarcinoma and history of Barrett's esophagus EGD now showed bleeding angioectasias in the stomach - he is currently on IV PPI drip has received 2 units of packed RBC on 03/20/2021 and 3rd unit on 03/22/21, continue to monitor H&H, have discussed the case with GI physician Dr. Benson Norway who will likely do EGD on 03/21/2021 as below, soft diet, monitor H&H and transfuse as needed goal of hemoglobin will be around 7.  Post discharge follow-up with his GI physician and surgeon at [facility].  EGD                           - Esophageal ulcer with no stigmata of recent bleeding.                            - Barrett's esophagus.                           -  4 cm hiatal hernia.                           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    2.  Mild AKI on top of underlying CKD stage IV.  Baseline creatinine 3.5, likely due to acute blood loss, hold ARB transfuse - improved.    3.  Some anemia related hypoperfusion causing mild lactic acidosis.  Clinically stable.  No signs of infection or sepsis  4. Obesity with OSA - BMI 31 - follow with PCP, nighttime BiPAP.   5.  Chronic combined systolic and EF 41% on echocardiogram done few months ago.  Currently compensated.  Low dose Lasix, Coreg and BiDil for now.  Monitor.  6.  Hypertension - Meds as in #5.  7.  Recent right rotator cuff surgery about 1 month ago with some mild right shoulder weakness and right arm tingling numbness ongoing for a few weeks.  Stable.    8. Hypokalemia.  Replaced.    9. DM2 - ISS  Lab Results  Component Value Date   HGBA1C 6.5 (H) 12/25/2020   CBG (last 3)  Recent Labs    03/21/21 2027 03/22/21 0038 03/22/21 0605  GLUCAP 261* 162* 131*        Condition -   Guarded  Family Communication  :  None  Code Status :  Full  Consults  :  GI  PUD Prophylaxis : PPI   Procedures  :      EGD 03/21/21 -                             - Esophageal ulcer with no stigmata of recent bleeding.                           - Barrett's esophagus.                           - 4 cm hiatal hernia.                           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                                  Disposition Plan  :    Status is: Observation  Dispo: The patient is from: Home              Anticipated d/c is to: Home              Patient currently is not medically stable to d/c.   Difficult to place patient No  DVT Prophylaxis  :    SCDs Start: 03/20/21 0258    Lab Results  Component Value Date   PLT 218 03/22/2021     Diet :  Diet Order             Diet regular Room service appropriate? Yes; Fluid consistency: Thin  Diet effective now                    Inpatient Medications  Scheduled Meds:  sodium chloride   Intravenous Once  sodium chloride   Intravenous Once   carvedilol  6.25 mg Oral BID   furosemide  20 mg Intravenous Once   [START ON 03/23/2021] furosemide  20 mg Oral Daily   insulin aspart  0-9 Units Subcutaneous Q6H   isosorbide-hydrALAZINE  1 tablet Oral TID   Continuous Infusions:  pantoprazole 8 mg/hr (03/22/21 0430)   PRN Meds:.acetaminophen **OR** [DISCONTINUED] acetaminophen, ondansetron (ZOFRAN) IV  Antibiotics  :    Anti-infectives (From admission, onward)    None        Time Spent in minutes  30   Lala Lund M.D on 03/22/2021 at 10:43 AM  To page go to [email or web address]   [facility] -  Office  [phone]  See all Orders from today for further details    Objective:   Vitals:   03/21/21 2108 03/21/21 2316 03/22/21 0353 03/22/21 0821  BP: 122/71 124/63 138/72 126/70  Pulse: 87 88 87 82  Resp: 18 19 19 20   Temp: 98.3 F (36.8 C) 98.3 F (36.8 C) 98.3 F (36.8 C) 97.6 F (36.4 C)  TempSrc: Oral Oral Oral Oral  SpO2:  94% 94% 95%  Weight:   91 kg   Height:        Wt Readings from Last 3 Encounters:  03/22/21 91 kg  02/23/21 92.7 kg  02/18/21 90.7 kg     Intake/Output Summary (Last 24 hours) at 03/22/2021 1043 Last data filed at 03/22/2021 0821 Gross per 24 hour  Intake 786.7 ml  Output 1100 ml  Net -313.3 ml     Physical Exam  Awake Alert, No new F.N deficits, Normal affect [place].AT,PERRAL Supple Neck, No JVD,   Symmetrical Chest wall movement, Good air movement bilaterally, CTAB RRR,No Gallops, Rubs or new Murmurs, No Parasternal Heave +ve B.Sounds, Abd Soft, No tenderness,   No Cyanosis, Clubbing or edema,        Data Review:    CBC Recent Labs  Lab 03/20/21 0020 03/20/21 0127 03/20/21 1048  03/20/21 1300 03/20/21 1600 03/21/21 0128 03/21/21 0711 03/22/21 0038  WBC 12.0*  --  9.6  --  10.2 8.4 7.4 6.8  HGB 7.9*   < > 8.2* 8.5* 7.1* 7.9* 7.9* 7.3*  HCT 24.6*   < > 25.0* 26.0* 22.0* 23.0* 23.1* 22.3*  PLT 288  --  230  --  225 205 213 218  MCV 88.8  --  85.3  --  85.6 82.4 83.1 84.2  MCH 28.5  --  28.0  --  27.6 28.3 28.4 27.5  MCHC 32.1  --  32.8  --  32.3 34.3 34.2 32.7  RDW 14.2  --  15.9*  --  16.3* 15.8* 15.9* 15.9*  LYMPHSABS 2.1  --   --   --   --  2.3  --  1.8  MONOABS 0.8  --   --   --   --  0.6  --  0.6  EOSABS 0.2  --   --   --   --  0.2  --  0.3  BASOSABS 0.1  --   --   --   --  0.1  --  0.1   < > = values in this interval not displayed.    Recent Labs  Lab 03/20/21 0020 03/20/21 0102 03/20/21 0127 03/20/21 0735 03/20/21 0739 03/21/21 0128 03/22/21 0038  NA 135  --  137 137  --  137 138  K 4.4  --  4.3 4.2  --  3.4* 4.0  CL 106  --  108 109  --  112* 113*  CO2 15*  --   --  18*  --  16* 20*  GLUCOSE 337*  --  308* 176*  --  135* 159*  BUN 91*  --  110* 98*  --  85* 72*  CREATININE 3.83*  --  4.10* 3.72*  --  4.00* 3.98*  CALCIUM 8.7*  --   --  8.8*  --  8.4* 8.5*  AST 10*  --   --  9*  --  8* 10*  ALT 9  --   --  8  --  8 9  ALKPHOS 54  --   --  50  --  44 44  BILITOT 0.4  --   --  0.9  --  0.7 0.5  ALBUMIN 3.3*  --   --  3.3*  --  3.0* 3.0*  MG  --   --   --  2.3  --  2.1 2.1  LATICACIDVEN  --  2.5*  --   --  1.0  --   --   INR 1.1  --   --  1.1  --   --   --     ------------------------------------------------------------------------------------------------------------------ No results for input(s): CHOL, HDL, LDLCALC, TRIG, CHOLHDL, LDLDIRECT in the last 72 hours.  Lab Results  Component Value Date   HGBA1C 6.5 (H) 12/25/2020   ------------------------------------------------------------------------------------------------------------------ No results for input(s): TSH, T4TOTAL, T3FREE, THYROIDAB in the last 72 hours.  Invalid  input(s): FREET3  Cardiac Enzymes No results for input(s): CKMB, TROPONINI, MYOGLOBIN in the last 168 hours.  Invalid input(s): CK ------------------------------------------------------------------------------------------------------------------    Component Value Date/Time   BNP 1,409.0 (H) 12/31/2019 1300     Radiology Reports DG Chest Portable 1 View  Result Date: 03/20/2021 CLINICAL DATA:  Recent EGD. Low suspicion for perforation. Vomiting blood. EXAM: PORTABLE CHEST 1 VIEW COMPARISON:  04/28/2020 FINDINGS: Postoperative changes in the mediastinum. Heart size is normal. Some prominence of the hila, likely representing central vascular prominence. No airspace disease or consolidation in the lungs. No free air demonstrated under the hemidiaphragms. No pneumothorax. No pleural effusions. Mediastinal contours appear intact. IMPRESSION: No evidence of active pulmonary disease. Electronically Signed   By: Lucienne Capers M.D.   On: 03/20/2021 00:55

## 2021-03-22 NOTE — Progress Notes (Signed)
Physical Therapy Treatment Patient Details Name: [name] MRN: [id number] DOB: 28-Jul-1955 Today's Date: 03/22/2021   History of Present Illness Pt is a 65 y.o. male who presented 03/20/21 with hematemesis and acute upper GI bleed. PMH: recently diagnosed esophageal adenocarcinoma, chronic combined systolic/diastolic heart failure, essential hypertension, type 2 diabetes mellitus, stage IV chronic kidney disease, CAD, MI, R eye vision loss    PT Comments    Patient ambulated 170 ft with occasional support via rail or countertop (after refusing to use either a RW or cane) and one standing rest break. Anticipate pt will progress quickly to walking without UE support. Discharge plan remains appropriate.     Recommendations for follow up therapy are one component of a multi-disciplinary discharge planning process, led by the attending physician.  Recommendations may be updated based on patient status, additional functional criteria and insurance authorization.  Follow Up Recommendations  No PT follow up;Supervision for mobility/OOB     Equipment Recommendations  None recommended by PT    Recommendations for Other Services       Precautions / Restrictions Precautions Precautions: Fall     Mobility  Bed Mobility               General bed mobility comments: pt sitting EOB on arrival    Transfers Overall transfer level: Needs assistance Equipment used: None Transfers: Sit to/from Stand Sit to Stand: Min guard         General transfer comment: slightly unsteady on first standing--reaching with UE for support, yet refused need for RW or cane  Ambulation/Gait Ambulation/Gait assistance: Min guard;Min assist Gait Distance (Feet): 170 Feet Assistive device: None (rail in hallway) Gait Pattern/deviations: Step-through pattern;Decreased stride length Gait velocity: reduced Gait velocity interpretation: <1.31 ft/sec, indicative of household ambulator General Gait  Details: after refusing any AD, pt began reaching for rail and countertops as he fatigued. Reports he feels weak overall. Denied SOB   Stairs             Wheelchair Mobility    Modified Rankin (Stroke Patients Only)       Balance Overall balance assessment: Mild deficits observed, not formally tested                                          Cognition Arousal/Alertness: Awake/alert Behavior During Therapy: WFL for tasks assessed/performed Overall Cognitive Status: Within Functional Limits for tasks assessed                                        Exercises      General Comments        Pertinent Vitals/Pain Pain Assessment: No/denies pain    Home Living Family/patient expects to be discharged to:: Private residence Living Arrangements: Alone Available Help at Discharge: Friend(s);Available 24 hours/day Type of Home: House Home Access: Stairs to enter Entrance Stairs-Rails: Left (ascending) Home Layout: Multi-level Home Equipment: Cane - single point;Grab bars - tub/shower      Prior Function Level of Independence: Independent      Comments: Normally does not use the cane. Reports he no longer drives, but his significant other drives him. Denies any falls in past 6 months.   PT Goals (current goals can now be found in the care plan section) Acute Rehab PT Goals  Patient Stated Goal: to get better Time For Goal Achievement: 04/03/21 Potential to Achieve Goals: Good Progress towards PT goals: Progressing toward goals    Frequency    Min 3X/week      PT Plan Current plan remains appropriate    Co-evaluation              AM-PAC PT "6 Clicks" Mobility   Outcome Measure  Help needed turning from your back to your side while in a flat bed without using bedrails?: None Help needed moving from lying on your back to sitting on the side of a flat bed without using bedrails?: A Little Help needed moving to and from a  bed to a chair (including a wheelchair)?: A Little Help needed standing up from a chair using your arms (e.g., wheelchair or bedside chair)?: A Little Help needed to walk in hospital room?: A Little Help needed climbing 3-5 steps with a railing? : A Little 6 Click Score: 19    End of Session Equipment Utilized During Treatment: Gait belt Activity Tolerance: Patient tolerated treatment well Patient left: in bed;with call bell/phone within reach;with bed alarm set (sitting EOB for lunch)   PT Visit Diagnosis: Unsteadiness on feet (R26.81);Other abnormalities of gait and mobility (R26.89);Muscle weakness (generalized) (M62.81);Difficulty in walking, not elsewhere classified (R26.2)     Time: 2440-1027 PT Time Calculation (min) (ACUTE ONLY): 20 min  Charges:  $Gait Training: 8-22 mins                      Arby Barrette, PT Pager [phone]    Rexanne Mano 03/22/2021, 2:07 PM

## 2021-03-23 ENCOUNTER — Encounter (HOSPITAL_COMMUNITY): Payer: Self-pay | Admitting: Gastroenterology

## 2021-03-23 DIAGNOSIS — K922 Gastrointestinal hemorrhage, unspecified: Secondary | ICD-10-CM | POA: Diagnosis not present

## 2021-03-23 LAB — COMPREHENSIVE METABOLIC PANEL
ALT: 10 U/L (ref 0–44)
AST: 11 U/L — ABNORMAL LOW (ref 15–41)
Albumin: 3.2 g/dL — ABNORMAL LOW (ref 3.5–5.0)
Alkaline Phosphatase: 51 U/L (ref 38–126)
Anion gap: 5 (ref 5–15)
BUN: 56 mg/dL — ABNORMAL HIGH (ref 8–23)
CO2: 21 mmol/L — ABNORMAL LOW (ref 22–32)
Calcium: 8.5 mg/dL — ABNORMAL LOW (ref 8.9–10.3)
Chloride: 112 mmol/L — ABNORMAL HIGH (ref 98–111)
Creatinine, Ser: 3.94 mg/dL — ABNORMAL HIGH (ref 0.61–1.24)
GFR, Estimated: 16 mL/min — ABNORMAL LOW (ref >60)
Glucose, Bld: 152 mg/dL — ABNORMAL HIGH (ref 70–99)
Potassium: 3.9 mmol/L (ref 3.5–5.1)
Sodium: 138 mmol/L (ref 135–145)
Total Bilirubin: 0.6 mg/dL (ref 0.3–1.2)
Total Protein: 5.4 g/dL — ABNORMAL LOW (ref 6.5–8.1)

## 2021-03-23 LAB — BPAM RBC
Blood Product Expiration Date: 202210122359
Blood Product Expiration Date: 202210142359
Blood Product Expiration Date: 202210172359
Blood Product Expiration Date: 202210232359
ISSUE DATE / TIME: 202210080242
ISSUE DATE / TIME: 202210081848
ISSUE DATE / TIME: 202210082255
ISSUE DATE / TIME: 202210101135
Unit Type and Rh: 7300
Unit Type and Rh: 7300
Unit Type and Rh: 7300
Unit Type and Rh: 7300

## 2021-03-23 LAB — TYPE AND SCREEN
ABO/RH(D): B POS
Antibody Screen: NEGATIVE
Unit division: 0
Unit division: 0
Unit division: 0
Unit division: 0

## 2021-03-23 LAB — CBC WITH DIFFERENTIAL/PLATELET
Abs Immature Granulocytes: 0.04 10*3/uL (ref 0.00–0.07)
Basophils Absolute: 0.1 10*3/uL (ref 0.0–0.1)
Basophils Relative: 1 %
Eosinophils Absolute: 0.3 10*3/uL (ref 0.0–0.5)
Eosinophils Relative: 4 %
HCT: 24.9 % — ABNORMAL LOW (ref 39.0–52.0)
Hemoglobin: 8.6 g/dL — ABNORMAL LOW (ref 13.0–17.0)
Immature Granulocytes: 1 %
Lymphocytes Relative: 28 %
Lymphs Abs: 1.9 10*3/uL (ref 0.7–4.0)
MCH: 29.1 pg (ref 26.0–34.0)
MCHC: 34.5 g/dL (ref 30.0–36.0)
MCV: 84.1 fL (ref 80.0–100.0)
Monocytes Absolute: 0.7 10*3/uL (ref 0.1–1.0)
Monocytes Relative: 10 %
Neutro Abs: 4 10*3/uL (ref 1.7–7.7)
Neutrophils Relative %: 56 %
Platelets: 214 10*3/uL (ref 150–400)
RBC: 2.96 MIL/uL — ABNORMAL LOW (ref 4.22–5.81)
RDW: 15.3 % (ref 11.5–15.5)
WBC: 7.1 10*3/uL (ref 4.0–10.5)
nRBC: 0 % (ref 0.0–0.2)

## 2021-03-23 LAB — MAGNESIUM: Magnesium: 2 mg/dL (ref 1.7–2.4)

## 2021-03-23 LAB — GLUCOSE, CAPILLARY: Glucose-Capillary: 146 mg/dL — ABNORMAL HIGH (ref 70–99)

## 2021-03-23 MED ORDER — PANTOPRAZOLE SODIUM 40 MG PO TBEC: 40.0000 mg | DELAYED_RELEASE_TABLET | Freq: Two times a day (BID) | ORAL | 0 refills | Status: DC

## 2021-03-23 MED ORDER — PANTOPRAZOLE SODIUM 40 MG PO TBEC: 40.0000 mg | DELAYED_RELEASE_TABLET | Freq: Two times a day (BID) | ORAL | Status: DC

## 2021-03-23 NOTE — TOC Initial Note (Addendum)
Transition of Care [facility]) - Initial/Assessment Note    Patient Details  Name: [name] MRN: [id number] Date of Birth: 1955-06-22  [facility] CM/SW Contact:    Verdell Carmine, RN Phone Number: 03/23/2021, 8:05 AM  Clinical Narrative:                  65 year old male in with GI bleed. Was just diagnosed with esophageal adenocarcinoma, will need to follow up with thoracic oncology. Has received 4 unit s of prbc, watching H&H. Marland Kitchen    PT recommendations noted, patient does not require any DME or PT/HH at this time.   CM will follow for needs, recommendations, and transitions.   Expected Discharge Plan: Home/Self Care Barriers to Discharge: Continued Medical Work up   Patient Goals and CMS Choice        Expected Discharge Plan and Services Expected Discharge Plan: Home/Self Care       Living arrangements for the past 2 months: Single Family Home Expected Discharge Date: 03/23/21                                    Prior Living Arrangements/Services Living arrangements for the past 2 months: Single Family Home Lives with:: Significant Other Patient language and need for interpreter reviewed:: Yes        Need for Family Participation in Patient Care: Yes (Comment) Care giver support system in place?: Yes (comment)   Criminal Activity/Legal Involvement Pertinent to Current Situation/Hospitalization: No - Comment as needed  Activities of Daily Living Home Assistive Devices/Equipment: None ADL Screening (condition at time of admission) Patient's cognitive ability adequate to safely complete daily activities?: Yes Is the patient deaf or have difficulty hearing?: Yes Does the patient have difficulty seeing, even when wearing glasses/contacts?: Yes Does the patient have difficulty concentrating, remembering, or making decisions?: Yes Patient able to express need for assistance with ADLs?: No Does the patient have difficulty dressing or bathing?:  No Independently performs ADLs?: Yes (appropriate for developmental age) Does the patient have difficulty walking or climbing stairs?: No Weakness of Legs: Both Weakness of Arms/Hands: None  Permission Sought/Granted                  Emotional Assessment       Orientation: : Oriented to Self, Oriented to Place, Oriented to  Time, Oriented to Situation Alcohol / Substance Use: Not Applicable Psych Involvement: No (comment)  Admission diagnosis:  Acute upper GI bleed [K92.2] UGIB (upper gastrointestinal bleed) [K92.2] Patient Active Problem List   Diagnosis Date Noted   UGIB (upper gastrointestinal bleed) 03/21/2021   Acute upper GI bleed 03/20/2021   Acute blood loss anemia 03/20/2021   Lactic acidosis 03/20/2021   Elevated serum creatinine 03/20/2021   Leukocytosis 03/20/2021   CKD (chronic kidney disease) stage 4, GFR 15-29 ml/min ([place]) 03/20/2021   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   Statin myopathy 01/26/2021   Hyperlipidemia associated with type 2 diabetes mellitus ([place]) 12/25/2020   Fatigue 10/14/2020   GERD (gastroesophageal reflux disease) 10/06/2020   Anemia of chronic disease 10/06/2020   Complete rotator cuff tear 08/26/2020   Myalgia due to statin 03/11/2020   Arthritis of both acromioclavicular joints 08/21/2019   Peripheral arterial disease ([place]) 05/31/2019   Edema 05/27/2019   CAD (coronary artery disease) 05/27/2019   DM type 2 causing vascular disease ([place]) 04/23/2019   S/P  CABG (coronary artery bypass graft) 03/25/2019   Moderate nonproliferative diabetic retinopathy of both eyes associated with type 2 diabetes mellitus ([place]) 03/18/2019   Special screening for malignant neoplasms, colon 02/04/2019   Family hx of colon cancer 02/04/2019   Cardiomyopathy ([place]) 06/08/2018   Anemia due to stage 3 chronic kidney disease ([place]) 06/08/2018   Chronic combined systolic and diastolic heart failure ([place]) 04/26/2018   Essential hypertension, benign  04/26/2018   Diabetic nephropathy ([place]) 04/26/2018   Mixed hyperlipidemia 04/26/2018   Elevated troponin 04/26/2018   PCP:  [name], MD Pharmacy:   [facility], [address] [place] [address] Phone: [phone] Fax: [phone]  [facility] - [address], [address] [place] [address] Phone: [phone] Fax: [phone]     Social Determinants of Health ([name]) Interventions    Readmission Risk Interventions Readmission Risk Prevention Plan 03/29/2019  Transportation Screening Complete  PCP or Specialist Appt within 5-7 Days Complete  Home Care Screening Complete  Medication Review (RN CM) Complete  Some recent data might be hidden

## 2021-03-23 NOTE — Discharge Summary (Signed)
[name] OIZ:[id number] DOB: 12/10/1955 DOA: 03/20/2021  PCP: [name], MD  Admit date: 03/20/2021  Discharge date: 03/23/2021  Admitted From: Home  Disposition:  Home   Recommendations for Outpatient Follow-up:   Follow up with PCP in 1-2 weeks  PCP Please obtain BMP/CBC, 2 view CXR in 1week,  (see Discharge instructions)   PCP Please follow up on the following pending results:    Home Health: None   Equipment/Devices: None  Consultations: GI Discharge Condition: Stable    CODE STATUS: Full    Diet Recommendation: Heart Healthy      Chief Complaint  Patient presents with   GI Bleeding     Brief history of present illness from the day of admission and additional interim summary    [name] is a 65 y.o. male with medical history significant for recently diagnosed esophageal adenocarcinoma at [facility],  chronic combined systolic/diastolic heart failure, essential hypertension, type 2 diabetes mellitus, stage IV chronic kidney disease with baseline creatinine 3.0-3.6.  Who is admitted to [facility] on 03/20/2021 with acute upper GI bleed after presenting from home to [facility] ED complaining of hematemesis and some melena.                                                                 Hospital Course    UGI bleed with acute blood loss related anemia due to recently diagnosed submucosal esophageal adenocarcinoma and history of Barrett's esophagus EGD now showed bleeding angioectasias in the stomach - he is currently on IV PPI drip has received 2 units of packed RBC on 03/20/2021 and 3rd unit on 03/22/21, now stable H&H, he is post EGD by Dr Benson Norway on 03/21/2021 as below, will DC home on PO BID PPI with post discharge follow-up with his GI physician and surgeon at [facility] & his PCP. Symptom  free now.   EGD                           - Esophageal ulcer with no stigmata of recent bleeding.                           - Barrett's esophagus.                           - 4 cm hiatal hernia.                           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       2.  Mild AKI on top of underlying CKD stage IV.  Baseline creatinine 3.5, likely due to acute blood loss, hold post transfusion - resolved.     3.  Some anemia related hypoperfusion causing mild lactic acidosis.  Clinically stable.  No signs of infection or sepsis   4. Obesity with OSA - BMI 31 - follow with PCP, nighttime BiPAP.   5.  Chronic combined systolic and EF 46% on echocardiogram done few months ago.  Currently compensated. Home dose dose Lasix, Coreg and BiDil .   6.  Hypertension - Home Coreg + BiDil   7.  Recent right rotator cuff surgery about 1 month ago with some mild right shoulder weakness and right arm tingling numbness ongoing for a few weeks.  Stable.     8. Hypokalemia.  Replaced.     9. DM2 - IHome Rx    Discharge diagnosis     Principal Problem:   Acute upper GI bleed Active Problems:   Chronic combined systolic and diastolic heart failure (HCC)   Essential hypertension, benign   DM type 2 causing vascular disease (HCC)   Acute blood loss anemia   Lactic acidosis   Elevated serum creatinine   Leukocytosis   CKD (chronic kidney disease) stage 4, GFR 15-29 ml/min (HCC)   UGIB (upper gastrointestinal bleed)    Discharge instructions    Discharge Instructions     Diet - low sodium heart healthy   Complete by: As directed    Discharge instructions   Complete by: As directed    Follow with Primary MD [name], MD in 7 days   Get CBC, CMP, 2 view Chest X ray -  checked next visit within 1 week by Primary MD   Activity: As tolerated with Full fall precautions use walker/cane & assistance as needed  Disposition Home    Diet: Heart  Healthy    Special Instructions: If you have smoked or chewed Tobacco  in the last 2 yrs please stop smoking, stop any regular Alcohol  and or any Recreational drug use.  On your next visit with your primary care physician please Get Medicines reviewed and adjusted.  Please request your Prim.MD to go over all Hospital Tests and Procedure/Radiological results at the follow up, please get all Hospital records sent to your Prim MD by signing hospital release before you go home.  If you experience worsening of your admission symptoms, develop shortness of breath, life threatening emergency, suicidal or homicidal thoughts you must seek medical attention immediately by calling 911 or calling your MD immediately  if symptoms less severe.  You Must read complete instructions/literature along with all the possible adverse reactions/side effects for all the Medicines you take and that have been prescribed to you. Take any new Medicines after you have completely understood and accpet all the possible adverse reactions/side effects.   Increase activity slowly   Complete by: As directed        Discharge Medications   Allergies as of 03/23/2021       Reactions   Bee Venom Anaphylaxis   Atorvastatin    myalgia   Diltiazem Itching   Rosuvastatin    myalgias   Valsartan Itching        Medication List     STOP taking these medications    Lantus SoloStar 100 UNIT/ML Solostar Pen Generic drug: insulin glargine   valsartan 80 MG tablet Commonly known as: DIOVAN       TAKE these medications    acetaminophen 500 MG tablet Commonly known as:  TYLENOL Take 1,000 mg by mouth every 6 (six) hours as needed for moderate pain or headache.   albuterol 108 (90 Base) MCG/ACT inhaler Commonly known as: VENTOLIN HFA Inhale 2 puffs into the lungs every 6 (six) hours as needed. What changed: reasons to take this   aspirin EC 81 MG tablet Take 4 tablets (325 mg total) by mouth daily. Swallow  whole.   B-D ULTRAFINE III SHORT PEN 31G X 8 MM Misc Generic drug: Insulin Pen Needle 1 each by Does not apply route as directed.   carvedilol 12.5 MG tablet Commonly known as: COREG Take 1 tablet (12.5 mg total) by mouth 2 (two) times daily.   doxazosin 4 MG tablet Commonly known as: CARDURA Take 4 mg by mouth at bedtime.   EPINEPHrine 0.3 mg/0.3 mL Soaj injection Commonly known as: EPI-PEN Inject 0.3 mg into the muscle as needed for anaphylaxis.   furosemide 40 MG tablet Commonly known as: LASIX Take 40 mg by mouth daily.   isosorbide-hydrALAZINE 20-37.5 MG tablet Commonly known as: BIDIL Take 1 tablet by mouth in the morning and at bedtime.   Ozempic (1 MG/DOSE) 4 MG/3ML Sopn Generic drug: Semaglutide (1 MG/DOSE) Inject 1 mg into the skin once a week.   pantoprazole 40 MG tablet Commonly known as: PROTONIX Take 1 tablet (40 mg total) by mouth 2 (two) times daily before a meal.   Repatha SureClick 191 MG/ML Soaj Generic drug: Evolocumab Inject 140 mg into the skin every 14 (fourteen) days.   sertraline 100 MG tablet Commonly known as: ZOLOFT TAKE 1 TABLET BY MOUTH DAILY         Follow-up Information     [name], MD. Schedule an appointment as soon as possible for a visit in 1 week(s).   Specialty: Family Medicine Why: And your gastroenterologist within a week Contact information: [place] [address] [phone]         Skeet Latch, MD .   Specialty: Cardiology Contact information: [address] [place] [address] [phone]                 Major procedures and Radiology Reports - PLEASE review detailed and final reports thoroughly  -       DG Chest Portable 1 View  Result Date: 03/20/2021 CLINICAL DATA:  Recent EGD. Low suspicion for perforation. Vomiting blood. EXAM: PORTABLE CHEST 1 VIEW COMPARISON:  04/28/2020 FINDINGS: Postoperative changes in the mediastinum. Heart size  is normal. Some prominence of the hila, likely representing central vascular prominence. No airspace disease or consolidation in the lungs. No free air demonstrated under the hemidiaphragms. No pneumothorax. No pleural effusions. Mediastinal contours appear intact. IMPRESSION: No evidence of active pulmonary disease. Electronically Signed   By: Lucienne Capers M.D.   On: 03/20/2021 00:55      Today   Subjective    Rakin Lemelle today has no headache,no chest abdominal pain,no new weakness tingling or numbness, feels much better wants to go home today.     Objective   Blood pressure (!) 119/95, pulse 73, temperature (!) 97.5 F (36.4 C), temperature source Oral, resp. rate 16, height 5\' 11"  (1.803 m), weight 92.1 kg, SpO2 95 %.   Intake/Output Summary (Last 24 hours) at 03/23/2021 0922 Last data filed at 03/23/2021 0754 Gross per 24 hour  Intake 1019.97 ml  Output 2425 ml  Net -1405.03 ml    Exam  Awake Alert, No new F.N deficits, Normal  affect [place].AT,PERRAL Supple Neck,  Symmetrical Chest wall movement, Good air movement bilaterally, CTAB RRR,No Gallops,Rubs or new Murmurs, No Parasternal Heave +ve B.Sounds, Abd Soft, Non tender,  No rebound -guarding or rigidity. No Cyanosis, Clubbing or edema,      Data Review   CBC w Diff:  Lab Results  Component Value Date   WBC 7.1 03/23/2021   HGB 8.6 (L) 03/23/2021   HGB 13.1 01/09/2020   HCT 24.9 (L) 03/23/2021   HCT 39.0 01/09/2020   PLT 214 03/23/2021   PLT 266 01/09/2020   LYMPHOPCT 28 03/23/2021   MONOPCT 10 03/23/2021   EOSPCT 4 03/23/2021   BASOPCT 1 03/23/2021    CMP:  Lab Results  Component Value Date   NA 138 03/23/2021   NA 138 02/08/2021   K 3.9 03/23/2021   CL 112 (H) 03/23/2021   CO2 21 (L) 03/23/2021   BUN 56 (H) 03/23/2021   BUN 43 (H) 02/08/2021   CREATININE 3.94 (H) 03/23/2021   CREATININE 1.94 (H) 05/13/2019   PROT 5.4 (L) 03/23/2021   ALBUMIN 3.2 (L) 03/23/2021   BILITOT 0.6 03/23/2021    ALKPHOS 51 03/23/2021   AST 11 (L) 03/23/2021   ALT 10 03/23/2021  .   Total Time in preparing paper work, data evaluation and todays exam - 76 minutes  Lala Lund M.D on 03/23/2021 at 9:22 AM  [facility]

## 2021-03-23 NOTE — Discharge Instructions (Signed)
Follow with Primary MD [name], MD in 7 days   Get CBC, CMP, 2 view Chest X ray -  checked next visit within 1 week by Primary MD   Activity: As tolerated with Full fall precautions use walker/cane & assistance as needed  Disposition Home    Diet: Heart Healthy    Special Instructions: If you have smoked or chewed Tobacco  in the last 2 yrs please stop smoking, stop any regular Alcohol  and or any Recreational drug use.  On your next visit with your primary care physician please Get Medicines reviewed and adjusted.  Please request your Prim.MD to go over all Hospital Tests and Procedure/Radiological results at the follow up, please get all Hospital records sent to your Prim MD by signing hospital release before you go home.  If you experience worsening of your admission symptoms, develop shortness of breath, life threatening emergency, suicidal or homicidal thoughts you must seek medical attention immediately by calling 911 or calling your MD immediately  if symptoms less severe.  You Must read complete instructions/literature along with all the possible adverse reactions/side effects for all the Medicines you take and that have been prescribed to you. Take any new Medicines after you have completely understood and accpet all the possible adverse reactions/side effects.

## 2021-03-23 NOTE — Progress Notes (Signed)
Pt alert and oriented x4. No complaints of pain. Removed IV's to left upper arm, and left forearm; catheter intact for both. Educated patient and caregiver on discharge instructions. Gave caregiver discharge packet. Neither had questions or concerns. This nurse assisted pt to w/c with no issues, then nurse tech transported pt to private vehicle with all belongings.

## 2021-03-24 ENCOUNTER — Telehealth: Payer: Self-pay

## 2021-03-24 NOTE — Telephone Encounter (Signed)
Spoke with patient in regards to where to have his PET scan done, he says he has to arrange for a ride to transport him to [place] for tests and it would be convenient to stay locally . Upcoming appt 03/29/21.

## 2021-03-24 NOTE — Telephone Encounter (Signed)
Transition Care Management Follow-up Telephone Call Date of discharge and from where: 03/23/2021 from [facility] How have you been since you were released from the hospital? Pt stated that he is feeling better. Pt did not have any questions at this time.  Any questions or concerns? No  Items Reviewed: Did the pt receive and understand the discharge instructions provided? Yes  Medications obtained and verified? Yes  Other? No  Any new allergies since your discharge? No  Dietary orders reviewed? No Do you have support at home? Yes   Functional Questionnaire: (I = Independent and D = Dependent) ADLs: I Bathing/Dressing- I Meal Prep- I Eating- I Maintaining continence- I Transferring/Ambulation- I Managing Meds- I   Follow up appointments reviewed:  PCP Hospital f/u appt confirmed? Yes  Scheduled to see [name], MD on 03/29/2021 @ 1:10pm. [place] [facility] f/u appt confirmed? Yes  Scheduled to see [facility] on 04/06/2021 @ 4:00pm. Are transportation arrangements needed? No  If their condition worsens, is the pt aware to call PCP or go to the Emergency Dept.? Yes Was the patient provided with contact information for the PCP's office or ED? Yes Was to pt encouraged to call back with questions or concerns? Yes

## 2021-03-24 NOTE — Telephone Encounter (Signed)
Nurses-typically oncology specialists like to have the PET scan done through the institution that they work with so that they have full access to the images and results. (Patient recently diagnosed with esophageal cancer I believe he is seeing a specialist at [facility] As for where else of PET scanning can be done is at [facility] but more than likely his specialist will want to do it where he has complete access to it) (It would be purely up to the specialist to determine if they would allow it to be done at [facility] but my gut feeling says that they will not want to do that) Patient has a transition of care visit with me on October 17 thank you

## 2021-03-29 ENCOUNTER — Other Ambulatory Visit: Payer: Self-pay

## 2021-03-29 ENCOUNTER — Ambulatory Visit (INDEPENDENT_AMBULATORY_CARE_PROVIDER_SITE_OTHER): Payer: Medicare HMO | Admitting: Family Medicine

## 2021-03-29 VITALS — BP 112/75 | HR 84 | Temp 97.3°F | Ht 71.0 in | Wt 198.0 lb

## 2021-03-29 DIAGNOSIS — I25708 Atherosclerosis of coronary artery bypass graft(s), unspecified, with other forms of angina pectoris: Secondary | ICD-10-CM | POA: Diagnosis not present

## 2021-03-29 DIAGNOSIS — K922 Gastrointestinal hemorrhage, unspecified: Secondary | ICD-10-CM | POA: Diagnosis not present

## 2021-03-29 DIAGNOSIS — C155 Malignant neoplasm of lower third of esophagus: Secondary | ICD-10-CM

## 2021-03-29 DIAGNOSIS — F321 Major depressive disorder, single episode, moderate: Secondary | ICD-10-CM | POA: Diagnosis not present

## 2021-03-29 DIAGNOSIS — R69 Illness, unspecified: Secondary | ICD-10-CM | POA: Diagnosis not present

## 2021-03-29 DIAGNOSIS — N184 Chronic kidney disease, stage 4 (severe): Secondary | ICD-10-CM | POA: Diagnosis not present

## 2021-03-29 NOTE — Progress Notes (Signed)
   Subjective:    Patient ID: [name], male    DOB: 1955-07-26, 65 y.o.   MRN: [id number]  HPI Follow up hospitalization - balance off, lack of energy - anxiety/ depression Patient recently in the hospital Had GI bleed Recently diagnosed with esophageal cancer Hopefully will get formal ablation done in the near future to help Relates a lot of fatigue tiredness feels little off balance In addition to this has underlying heart disease, stage IV kidney, previous small strokes Denies any melena hematochezia Is able to eat okay Finds himself feeling depressed and anxious but is tolerating things as best as possible Hospital notes labs reviewed X-ray was recommended but previous x-ray portable looked good plus also I do not find evidence with him the discharge summary to indicate the need to do a repeat chest x-ray  Review of Systems     Objective:   Physical Exam General-in no acute distress Eyes-no discharge Lungs-respiratory rate normal, CTA CV-no murmurs,RRR Extremities skin warm dry no edema Neuro grossly normal Behavior normal, alert  Abdomen is soft no guarding rebound or tenderness      Assessment & Plan:  1. Malignant neoplasm of lower third of esophagus (HCC) Will touch base with the cancer coordinator at [facility] to see if he could do his PET scan in [place] due to transportation issues - CBC with Differential/Platelet - Basic metabolic panel  2. Acute upper GI bleed Check lab work.  Blood pressure looks good stable overall - CBC with Differential/Platelet - Basic metabolic panel  3. CKD (chronic kidney disease) stage 4, GFR 15-29 ml/min (HCC) Check lab work.  Follow-up from hospital - CBC with Differential/Platelet - Basic metabolic panel  Underlying heart disease taking his medications as directed gets short of breath with activity which is anginal equivalent but stable  Depression on sertraline.  Patient has legitimate reason to be anxious and  depressed hopefully will do fairly well with medicine recheck in 6 weeks

## 2021-03-30 LAB — CBC WITH DIFFERENTIAL/PLATELET
Basophils Absolute: 0.1 10*3/uL (ref 0.0–0.2)
Basos: 2 %
EOS (ABSOLUTE): 0.2 10*3/uL (ref 0.0–0.4)
Eos: 2 %
Hematocrit: 29.7 % — ABNORMAL LOW (ref 37.5–51.0)
Hemoglobin: 10.3 g/dL — ABNORMAL LOW (ref 13.0–17.7)
Immature Grans (Abs): 0.1 10*3/uL (ref 0.0–0.1)
Immature Granulocytes: 1 %
Lymphocytes Absolute: 1.8 10*3/uL (ref 0.7–3.1)
Lymphs: 20 %
MCH: 29.5 pg (ref 26.6–33.0)
MCHC: 34.7 g/dL (ref 31.5–35.7)
MCV: 85 fL (ref 79–97)
Monocytes Absolute: 0.6 10*3/uL (ref 0.1–0.9)
Monocytes: 7 %
Neutrophils Absolute: 6.1 10*3/uL (ref 1.4–7.0)
Neutrophils: 68 %
Platelets: 322 10*3/uL (ref 150–450)
RBC: 3.49 x10E6/uL — ABNORMAL LOW (ref 4.14–5.80)
RDW: 15.7 % — ABNORMAL HIGH (ref 11.6–15.4)
WBC: 8.8 10*3/uL (ref 3.4–10.8)

## 2021-03-30 LAB — BASIC METABOLIC PANEL
BUN/Creatinine Ratio: 12 (ref 10–24)
BUN: 43 mg/dL — ABNORMAL HIGH (ref 8–27)
CO2: 19 mmol/L — ABNORMAL LOW (ref 20–29)
Calcium: 9.3 mg/dL (ref 8.6–10.2)
Chloride: 105 mmol/L (ref 96–106)
Creatinine, Ser: 3.52 mg/dL — ABNORMAL HIGH (ref 0.76–1.27)
Glucose: 156 mg/dL — ABNORMAL HIGH (ref 70–99)
Potassium: 4.8 mmol/L (ref 3.5–5.2)
Sodium: 141 mmol/L (ref 134–144)
eGFR: 19 mL/min/{1.73_m2} — ABNORMAL LOW (ref >59)

## 2021-03-31 ENCOUNTER — Telehealth: Payer: Self-pay | Admitting: Cardiovascular Disease

## 2021-03-31 NOTE — Telephone Encounter (Signed)
Returned call to patient who states he has an upcoming appointment with a surgeon in [place] for potential removal of a portion of his larynx. He is concerned about undergoing surgery due to recent abnormal stress test results. I advised him to meet with the surgeon and direct them to contact our office regarding any potential surgery that he may need. Advised also that the surgeon can reach out to [place] to schedule a time to talk with Dr. Oval Linsey if needed. Patient verbalized understanding and agreement and thanked me for the call.

## 2021-03-31 NOTE — Telephone Encounter (Signed)
Pt is wanting to know with his heart condition if he'll be able to be cleared for surgert.. edu pt to have PCP to contact us to fil out clearance paperwork otp or schedule a preop visit... please advise

## 2021-04-08 ENCOUNTER — Other Ambulatory Visit: Payer: Self-pay | Admitting: Family Medicine

## 2021-04-13 ENCOUNTER — Telehealth: Payer: Self-pay | Admitting: *Deleted

## 2021-04-13 NOTE — Telephone Encounter (Signed)
I called pt about bringing machine tomorrow at appt.  He stated that he cannot come, no transportation or caregiver left.  He would call thursday this week about reschedule.  I scheduled 07-07-21 at 1315 (pt not aware) has I did not want to go further out.  I did placed on waitlist.

## 2021-04-14 ENCOUNTER — Ambulatory Visit: Payer: Medicare Other | Admitting: Neurology

## 2021-04-15 DIAGNOSIS — N1832 Chronic kidney disease, stage 3b: Secondary | ICD-10-CM | POA: Diagnosis not present

## 2021-04-15 DIAGNOSIS — I25810 Atherosclerosis of coronary artery bypass graft(s) without angina pectoris: Secondary | ICD-10-CM | POA: Diagnosis not present

## 2021-04-15 DIAGNOSIS — Z794 Long term (current) use of insulin: Secondary | ICD-10-CM | POA: Diagnosis not present

## 2021-04-15 DIAGNOSIS — Z8673 Personal history of transient ischemic attack (TIA), and cerebral infarction without residual deficits: Secondary | ICD-10-CM | POA: Diagnosis not present

## 2021-04-15 DIAGNOSIS — E1165 Type 2 diabetes mellitus with hyperglycemia: Secondary | ICD-10-CM | POA: Diagnosis not present

## 2021-04-15 DIAGNOSIS — Z6828 Body mass index (BMI) 28.0-28.9, adult: Secondary | ICD-10-CM | POA: Diagnosis not present

## 2021-04-20 ENCOUNTER — Telehealth: Payer: Self-pay

## 2021-04-20 ENCOUNTER — Telehealth: Payer: Self-pay | Admitting: Cardiovascular Disease

## 2021-04-20 DIAGNOSIS — C160 Malignant neoplasm of cardia: Secondary | ICD-10-CM | POA: Diagnosis not present

## 2021-04-20 DIAGNOSIS — N281 Cyst of kidney, acquired: Secondary | ICD-10-CM | POA: Diagnosis not present

## 2021-04-20 MED ORDER — PRALUENT 150 MG/ML ~~LOC~~ SOAJ: 150.0000 mg | SUBCUTANEOUS | 11 refills | Status: DC

## 2021-04-20 NOTE — Telephone Encounter (Signed)
Please advise 

## 2021-04-20 NOTE — Telephone Encounter (Signed)
Pt c/o medication issue:  1. Name of Medication: Alirocumab (PRALUENT) 150 MG/ML SOAJ  2. How are you currently taking this medication (dosage and times per day)? Inject 150 mg into the skin every 14 (fourteen) days.  3. Are you having a reaction (difficulty breathing--STAT)? No   4. What is your medication issue? Pharmacy called and mentioned that their wholesaler does not carry it

## 2021-04-20 NOTE — Telephone Encounter (Signed)
LVM TO SWITCH FROM REPATHA TO PRALUENT DUE TO INSURANCE. INSTRUCTED PT TO CALL IF UNAFFORDABLE

## 2021-04-20 NOTE — Telephone Encounter (Signed)
Called and spoke to mrs. Moore and they stated that the pt would be fine to get the praluent from [place] and that they would relay that msg to the pt

## 2021-04-22 ENCOUNTER — Ambulatory Visit (HOSPITAL_BASED_OUTPATIENT_CLINIC_OR_DEPARTMENT_OTHER): Payer: Medicare Other | Admitting: Cardiovascular Disease

## 2021-04-26 ENCOUNTER — Ambulatory Visit: Payer: Self-pay | Admitting: Neurology

## 2021-04-28 ENCOUNTER — Ambulatory Visit (HOSPITAL_BASED_OUTPATIENT_CLINIC_OR_DEPARTMENT_OTHER): Payer: Medicare Other | Admitting: Cardiovascular Disease

## 2021-05-10 ENCOUNTER — Other Ambulatory Visit: Payer: Self-pay

## 2021-05-10 ENCOUNTER — Encounter: Payer: Self-pay | Admitting: Family Medicine

## 2021-05-10 ENCOUNTER — Ambulatory Visit (INDEPENDENT_AMBULATORY_CARE_PROVIDER_SITE_OTHER): Payer: Medicare HMO | Admitting: Family Medicine

## 2021-05-10 VITALS — BP 138/86 | Temp 97.6°F | Wt 202.8 lb

## 2021-05-10 DIAGNOSIS — E1159 Type 2 diabetes mellitus with other circulatory complications: Secondary | ICD-10-CM

## 2021-05-10 DIAGNOSIS — N184 Chronic kidney disease, stage 4 (severe): Secondary | ICD-10-CM | POA: Diagnosis not present

## 2021-05-10 DIAGNOSIS — E1121 Type 2 diabetes mellitus with diabetic nephropathy: Secondary | ICD-10-CM

## 2021-05-10 DIAGNOSIS — I10 Essential (primary) hypertension: Secondary | ICD-10-CM | POA: Diagnosis not present

## 2021-05-10 DIAGNOSIS — I5042 Chronic combined systolic (congestive) and diastolic (congestive) heart failure: Secondary | ICD-10-CM | POA: Diagnosis not present

## 2021-05-10 NOTE — Progress Notes (Signed)
   Subjective:    Patient ID: [name], male    DOB: Jan 30, 1956, 65 y.o.   MRN: [id number]  HPI Pt here for 6 week follow up. Pt states he goes Wednesday to [place] to find out about PET scan results. Pt states no changes since last seen.  Patient being treated for esophageal cancer His recent PET scan did not show metastatic disease He does also suffer from cardiomyopathy diabetes and chronic kidney disease He is realistic that he is facing some pretty serious health issues DM type 2 causing vascular disease ([place])  Chronic combined systolic and diastolic heart failure ([place])  Essential hypertension, benign  Diabetic nephropathy associated with type 2 diabetes mellitus (HCC)  CKD (chronic kidney disease) stage 4, GFR 15-29 ml/min (HCC) Recent labs x-rays and PET scan reviewed in detail.  Was discussed with patient.  Review of Systems     Objective:   Physical Exam  General-in no acute distress Eyes-no discharge Lungs-respiratory rate normal, CTA CV-no murmurs,RRR Extremities skin warm dry no edema Neuro grossly normal Behavior normal, alert       Assessment & Plan:  1. DM type 2 causing vascular disease ([place]) Under the care of endocrinology recently been under decent control continue current measures  2. Chronic combined systolic and diastolic heart failure (HCC) Heart failure stable today continue current medications as is if he does have low blood pressure reduce carvedilol to half tablet twice daily otherwise keep everything as is  3. Essential hypertension, benign Blood pressure decent control currently continue current measures do not want to be too strict because of risk of falls  4. Diabetic nephropathy associated with type 2 diabetes mellitus ([place]) Please see per above  5. CKD (chronic kidney disease) stage 4, GFR 15-29 ml/min (HCC) Sees kidney specialist regular basis.  Long-term prognosis not necessarily good but stable currently  Esophageal  cancer no metastatic disease hopefully the treatment for him will be relatively decent without major setbacks  Long discussion today held regarding long-term prognosis of his condition hopefully treatments offered by [facility] will be reasonable patient to follow-up in 6 weeks

## 2021-05-12 DIAGNOSIS — C160 Malignant neoplasm of cardia: Secondary | ICD-10-CM | POA: Diagnosis not present

## 2021-05-12 DIAGNOSIS — C159 Malignant neoplasm of esophagus, unspecified: Secondary | ICD-10-CM | POA: Diagnosis not present

## 2021-05-12 DIAGNOSIS — I219 Acute myocardial infarction, unspecified: Secondary | ICD-10-CM | POA: Diagnosis not present

## 2021-05-17 ENCOUNTER — Encounter (HOSPITAL_BASED_OUTPATIENT_CLINIC_OR_DEPARTMENT_OTHER): Payer: Self-pay

## 2021-05-17 ENCOUNTER — Telehealth: Payer: Self-pay | Admitting: Cardiovascular Disease

## 2021-05-17 ENCOUNTER — Encounter (HOSPITAL_BASED_OUTPATIENT_CLINIC_OR_DEPARTMENT_OTHER): Payer: Medicaid Other | Admitting: Cardiovascular Disease

## 2021-05-17 NOTE — Telephone Encounter (Signed)
    Pt said he is sick since this weekend, he is coughing and if he is talking for a while he will feel gagged then throw up. He requested if he can change his appt to virtual appt, he said he needs to speak with Dr. Oval Linsey

## 2021-05-17 NOTE — Progress Notes (Incomplete)
Cardiology Office Note   Date:  05/17/2021   ID:  [name], DOB 17-Aug-1955, MRN [id number]  PCP:  [name], MD  Cardiologist:  Skeet Latch, MD  Nephrologist:  Referring MD: [name], MD   CC: Hypertension  This visit type was conducted due to national recommendations for restrictions regarding the COVID-19 Pandemic (e.g. social distancing) in an effort to limit this patient's exposure and mitigate transmission in our community.  Due to her co-morbid illnesses, this patient is at least at moderate risk for complications without adequate follow up.  This format is felt to be most appropriate for this patient at this time.  All issues noted in this document were discussed and addressed.  A limited physical exam was performed with this format.  Please refer to the patient's chart for her consent to telehealth for [facility].   Patient location: *** Provider location: ***  History of Present Illness:    [name] is a 65 y.o. male with a hx of CAD status post CABG, chronic systolic and diastolic heart failure, hypertension, hyperlipidemia, diabetes, peripheral arterial disease, and CKD 3 here for follow-up. He initially established care in the hypertension clinic 10/14/2020. He had a four-vessel CABG on 03/2019.  He has struggled with poorly controlled blood pressures and his blood pressure has been as high as the 200s over 100s.  He has not tolerated amlodipine, lisinopril, HCTZ, or beta-blockers due to them making him feel fatigued.  He saw [name] on 7/23 and his blood pressure was 172/88.  He was referred to advanced hypertension clinic for further evaluation and management.  He notes that his BP is labile.   At his intial appointment he reported shortness of breath and lightheadedness.  Dr. Moshe Cipro started him on lasix which has helped his edema.  This was increased to 60mg  daily after a hospitalization for acute on chronic diastolic heart failure.   He has been working on his diet.  He limits carbohydrates and has a high protein diet.  He limits red meat.  He drinks a lot of water and limits soda.  He quit smoking 25-30 years ago.  He smoked 2ppd x10-15 years.   At his initial appointment he was started on doxazosin.  He followed up with our pharmacist and started on valsartan. At his last appointment he had orthostatic drops in his blood pressures to the 90s, so doxazosin was reduced. He followed up with our pharmacist 08/2020 and his blood pressure was in the 140s but no changes were made due to his issues with low blood pressure in the past.   He saw [name], NP in 01/2021 after wearing a Zio monitor that showed 10 episodes of SVT and 3 short runs of NSVT. He reported feeling lightheaded and generally unwell. He was referred for a cardiac Cath, but because of his kidney function had a nuclear stress test instead. His stress showed LVEF 28% with prior infarct and peri-infarct ischemia in the inferior and inferolateral regions.  At his last appointment, he burst a blood vessel in his L eye 2.5 weeks prior while bending over and could only see limited shapes and outlines. His blood pressure at home improved but continued to fluctuate.    Today, [name] is accompanied by his wife. About 2.5 weeks ago a blood vessel burst in his left eye. Yesterday this occurred in his right eye while bending over to pick up laundry. He can see limited shapes  and outlines only. From a cardiovascular standpoint he appears well. A BP log shows his blood pressure has improved but continues to fluctuate. He denies chest pain and edema. For his diet, he may have 1 cup of coffee or tea in the morning, but mostly drinks water throughout the day. Rarely he may drink 1/2 of a soda. Usually he has 2 meals a day, the second meal around 2-3 PM. He has not been able to participate in formal exercise due to his sight. Mostly he is fearful of falling due to instability.  Previously he was taking valsartan on an empty stomach and experienced stomach pain. At night he is taking doxazosin. Every night he uses a BiPAP. Recently he was told he is not a candidate for a kidney transplant. He denies any palpitations, or shortness of breath. No lightheadedness, headaches, syncope, orthopnea, or PND. Also has no exertional symptoms.  Today, he  He denies any palpitations, chest pain, or shortness of breath, lightheadedness, headaches, syncope, orthopnea, PND, lower extremity edema or exertional symptoms.  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    Past Surgical History:  Procedure Laterality Date   BIOPSY  06/27/2019   Procedure: BIOPSY;  Surgeon: Rogene Houston, MD;  [facility];  Service: Endoscopy;;  ascending colon polyp   BIOPSY  10/21/2020   Procedure: BIOPSY;  Surgeon: Rogene Houston, MD;  [facility];  Service: Endoscopy;;  esophageal,gastric polyp   BIOPSY  02/24/2021   Procedure: BIOPSY;  Surgeon: Rogene Houston, MD;  [facility];  Service: Endoscopy;;  distal and proximal esophageal biopsies    CARDIAC SURGERY     CATARACT EXTRACTION     COLONOSCOPY N/A 06/27/2019   [name]:Diverticulosis in the entire examined colon. tubular adenoma in ascending colon, 2mm tubular adenoma in prox sigmoid. external hemorrhoids   CORONARY ARTERY BYPASS GRAFT N/A 03/25/2019   Procedure: CORONARY ARTERY BYPASS GRAFTING (CABG) X   4 USING LEFT INTERNAL MAMMARY ARTERY AND RIGHT SAPHENOUS VEIN GRAFTS;  Surgeon: [name], MD;  Location: [place];  Service: Open Heart Surgery;  Laterality: N/A;   ESOPHAGOGASTRODUODENOSCOPY (EGD) WITH PROPOFOL N/A 10/21/2020   rehman:Normal hypopharynx.Normal proximal esophagus and mid esophagus.Esophageal mucosal changes consistent with long-segment Barrett's esophagus. (Focal low-grade dysplasia and atypia proximally) z line irregular 37 cm from incisors, 3 cm HH, single gastric polyp (fundic gland) normal duodenal bulb/second portion of duodenum, proximal margin of Barrett's at 34 cm   ESOPHAGOGASTRODUODENOSCOPY (EGD) WITH PROPOFOL N/A 02/24/2021   Procedure: ESOPHAGOGASTRODUODENOSCOPY (EGD) WITH PROPOFOL;  Surgeon: Rogene Houston, MD;  [facility];  Service: Endoscopy;  Laterality: N/A;  1:35   ESOPHAGOGASTRODUODENOSCOPY (EGD) WITH PROPOFOL N/A 03/21/2021   Procedure: ESOPHAGOGASTRODUODENOSCOPY (EGD) WITH PROPOFOL;  Surgeon: Carol Ada, MD;  Location: [place];  Service: Endoscopy;  Laterality: N/A;   EXCISION MORTON'S NEUROMA     EYE SURGERY Left    retina   POLYPECTOMY  06/27/2019   Procedure: POLYPECTOMY;  Surgeon:  Rogene Houston, MD;  [facility];  Service: Endoscopy;;  proximal sigmoid colon   RIGHT/LEFT HEART CATH AND CORONARY ANGIOGRAPHY N/A 03/12/2019   Procedure: RIGHT/LEFT HEART CATH AND CORONARY ANGIOGRAPHY;  Surgeon: [name], MD;  Location: [place] [facility];  Service: Cardiovascular;  Laterality: N/A;   SHOULDER ARTHROSCOPY WITH ROTATOR CUFF REPAIR AND SUBACROMIAL DECOMPRESSION Right 08/26/2020   Procedure: RIGHT SHOULDER MINI OPEN ROTATOR CUFF REPAIR AND SUBACROMIAL DECOMPRESSION WITH PATCH GRAFT;  Surgeon: Susa Day, MD;  Location: [facility];  Service: Orthopedics;  Laterality: Right;  90 MINS GENERAL WITH BLOCK   TEE WITHOUT CARDIOVERSION N/A 03/25/2019   Procedure: TRANSESOPHAGEAL ECHOCARDIOGRAM (TEE);  Surgeon:  [name], MD;  Location: [place];  Service: Open Heart Surgery;  Laterality: N/A;    Current Medications: No outpatient medications have been marked as taking for the 05/17/21 encounter (Appointment) with Skeet Latch, MD.   Current Facility-Administered Medications for the 05/17/21 encounter (Appointment) with Skeet Latch, MD  Medication   sodium chloride flush (NS) 0.9 % injection 3 mL     Allergies:   Bee venom, Atorvastatin, Diltiazem, Rosuvastatin, and Valsartan   Social History   Socioeconomic History   Marital status: Single    Spouse name: Not on file   Number of children: Not on file   Years of education: Not on file   Highest education level: Not on file  Occupational History   Not on file  Tobacco Use   Smoking status: Former    Types: Cigarettes    Quit date: 07/06/1988    Years since quitting: 32.8   Smokeless tobacco: Never  Vaping Use   Vaping Use: Never used  Substance and Sexual Activity   Alcohol use: Not Currently    Alcohol/week: 2.0 standard drinks    Types: 2 Glasses of wine per week    Comment: socially   Drug use: No   Sexual activity: Not on file  Other Topics Concern   Not on file  Social History Narrative   Not on file   Social Determinants of Health   Financial Resource Strain: Not on file  Food Insecurity: Not on file  Transportation Needs: Not on file  Physical Activity: Not on file  Stress: Not on file  Social Connections: Not on file     Family History: The patient's family history includes Colon cancer in his mother; Diabetes in his father; Heart Problems in his father; Valvular heart disease in his father.  ROS:   Please see the history of present illness.    All other systems reviewed and are negative.  EKGs/Labs/Other Studies Reviewed:    EKG:     05/17/21: Sinus ***, rate *** bpm 01/01/20: Sinus rhythm.  Rate 87 bpm.  Nonspecific T wave abnormalities.   NM Stress 02/12/2021: Narrative & Impression       Findings are consistent with prior myocardial infarction with peri-infarct ischemia. The study is high risk.   No ST deviation was noted.   LV perfusion is abnormal. There is evidence of ischemia. There is evidence of infarction. Defect 1: There is a medium defect with moderate reduction in uptake present in the mid to basal inferior and inferolateral location(s) that is fixed. There is abnormal wall motion in the defect area. Consistent with infarction and peri-infarct ischemia.   Left ventricular function is abnormal. Global function is severely reduced. There was a single regional abnormality. Nuclear stress EF: 28 %. The left ventricular ejection fraction is  severely decreased (<30%). End diastolic cavity size is moderately enlarged. End systolic cavity size is moderately enlarged.   Prior study available for comparison from 09/25/2019. There are changes compared to prior study. The left ventricular ejection fraction has decreased. LVEF 35%, moderate size and intensity fixed inferior defect suggestive of scar with mild peri-infarct ischemia   Moderate size and intensity, mostly fixed basal to mid inferior and inferolateral perfusion defect (SDS 1), suggestive of scar with minimal peri-infarct ischemia. LVEF 28% with global hypokinesis and basal to mid inferior akinesis. This is a high risk study. In comparison with a prior study in 2021, the LVEF is lower (was previously 35%), however, there are no new reversible perfusion defects.    Echo 02/04/2021:  1. Left ventricular ejection fraction, by estimation, is 45 to 50%. The  left ventricle has mildly decreased function. The left ventricle  demonstrates regional wall motion abnormalities (see scoring  diagram/findings for description). The left ventricular   internal cavity size was moderately to severely dilated. Left ventricular  diastolic parameters are consistent with Grade I diastolic dysfunction  (impaired relaxation). There is severe  dyskinesis of the left ventricular,  basal-mid inferior wall.   2. Right ventricular systolic function is normal. The right ventricular  size is normal.   3. Left atrial size was severely dilated.   4. Right atrial size was mildly dilated.   5. The mitral valve is normal in structure. Mild mitral valve  regurgitation.   6. The aortic valve is grossly normal. Aortic valve regurgitation is not  visualized.   7-Day Monitor 01/06/2021: 7 Day Event Monitor Quality: Fair.  Baseline artifact. Predominant rhythm: sinus rhythm Average heart rate: 83 bpm Max heart rate: 132 bpm Min heart rate: 67 bpm Pauses >2.5 seconds: none   Frequent PVCs (3.4%) 3 episodes of NSVT up to 5 beats  Up to 12 beats of SVT  Echo 09/2019:  1. Left ventricular ejection fraction, by estimation, is 55 to 60%. The  left ventricle has normal function. The left ventricle has no regional  wall motion abnormalities. There is moderate left ventricular hypertrophy.  Left ventricular diastolic  parameters are consistent with Grade I diastolic dysfunction (impaired  relaxation).   2. Right ventricular systolic function is normal. The right ventricular  size is normal.   3. Left atrial size was severely dilated.   4. The mitral valve is normal in structure. Trivial mitral valve  regurgitation. No evidence of mitral stenosis.   5. The aortic valve was not well visualized. Aortic valve regurgitation  is not visualized. No aortic stenosis is present.   6. The inferior vena cava is normal in size with greater than 50%  respiratory variability, suggesting right atrial pressure of 3 mmHg.   Echo 05/29/2019 1. Left ventricular ejection fraction, by visual estimation, is 50 to  55%. The left ventricle has low normal function. There is mildly increased  left ventricular hypertrophy.   2. Basal anterolateral segment, basal inferior segment, and basal  inferolateral segment are abnormal.   3. The left ventricle demonstrates  regional wall motion abnormalities.   4. Global right ventricle has mildly reduced systolic function.The right  ventricular size is normal. No increase in right ventricular wall  thickness.   5. Left atrial size was moderately dilated.   6. Right atrial size was normal.   7. Moderate mitral annular calcification.   8. The mitral valve is grossly normal. Trivial mitral valve  regurgitation.   9. The tricuspid valve is  grossly normal. Tricuspid valve regurgitation  is trivial.  10. The aortic valve is tricuspid. Aortic valve regurgitation is not  visualized.  11. The pulmonic valve was grossly normal. Pulmonic valve regurgitation is  trivial.  12. Normal pulmonary artery systolic pressure.  13. The tricuspid regurgitant velocity is 2.30 m/s, and with an assumed  right atrial pressure of 3 mmHg, the estimated right ventricular systolic  pressure is normal at 24.2 mmHg.  14. The inferior vena cava is normal in size with greater than 50%  respiratory variability, suggesting right atrial pressure of 3 mmHg.   VAS [place] LOWER EXTREMITY VENOUS 03/30/2019: Right: There is no evidence of deep vein thrombosis in the lower  extremity.  Left: No evidence of common femoral vein obstruction.  ECHO INTRAOPERATIVE TEE 03/25/2019: POST-OP IMPRESSIONS  - Aorta: The aorta appears unchanged from pre-bypass.  - Left Atrial Appendage: The left atrial appendage appears unchanged from  pre-bypass.  - Aortic Valve: The aortic valve appears unchanged from pre-bypass.  - Mitral Valve: The mitral valve appears unchanged from pre-bypass.  - Tricuspid Valve: The tricuspid valve appears unchanged from pre-bypass.  - Pericardium: The pericardium appears unchanged from pre-bypass.  VAS US DOPPLER PRE CABG 03/21/2019:  Right Carotid: Velocities in the right ICA are consistent with a 1-39%  stenosis.   Left Carotid: There is no evidence of stenosis in the left ICA.  Vertebrals: Bilateral vertebral arteries  demonstrate antegrade flow.   Bilateral Extremity: Doppler waveforms remain within normal limits with  compression bilaterally for the radial arteries. Doppler waveforms remain  within normal limits with compression bilaterally for the ulnar arteries.   Echo 03/15/2019  1. Left ventricular ejection fraction, by visual estimation, is 45 to  50%. The left ventricle has mildly decreased function. There is mildly  increased left ventricular hypertrophy.   2. Left ventricular diastolic Doppler parameters are consistent with  impaired relaxation pattern of LV diastolic filling.   3. Global right ventricle has normal systolic function.The right  ventricular size is normal. No increase in right ventricular wall  thickness.   4. Left atrial size was severely dilated.   5. Right atrial size was normal.   6. Mild mitral annular calcification.   7. Moderate calcification of the mitral valve leaflet(s).   8. Moderate thickening of the mitral valve leaflet(s).   9. The mitral valve is abnormal. Trace mitral valve regurgitation. No  evidence of mitral stenosis.  10. The tricuspid valve is normal in structure. Tricuspid valve  regurgitation is trivial.  11. The aortic valve is tricuspid Aortic valve regurgitation was not  visualized by color flow Doppler. Mild aortic valve sclerosis without  stenosis.  12. The pulmonic valve was not well visualized. Pulmonic valve  regurgitation is mild by color flow Doppler.  13. Mildly elevated pulmonary artery systolic pressure.   Right/Left Heart Cath and Coronary Angiography 03/12/2019: Mid LAD lesion is 80% stenosed. Ost LAD to Mid LAD lesion is 50% stenosed. 1st Diag lesion is 80% stenosed. 2nd Diag lesion is 75% stenosed. Mid LM to Dist LM lesion is 25% stenosed. Prox Cx lesion is 90% stenosed. 1st Mrg lesion is 80% stenosed. 2nd Mrg lesion is 80% stenosed. Dist RCA lesion is 90% stenosed. Mid RCA lesion is 75% stenosed. RPAV lesion is 80%  stenosed. LV end diastolic pressure is moderately elevated. There is no aortic valve stenosis. Hemodynamic findings consistent with mild pulmonary hypertension.   Severe multivessel CAD.  Plan for outpatient evaluation for CABG>  His symptoms  are all with exertion.  He feels fine at rest.  I instructed him to avoid any strenuous activity, or anything that would bring on shortness of breath.  Continue aspirin and Imdur.   Continue Crestor.   Echo 04/26/2018: - Left ventricle: The cavity size was normal. There was minimal    concentric hypertrophy. Systolic function was mildly reduced. The    estimated ejection fraction was in the range of 45% to 50%.    Diffuse hypokinesis. Findings consistent with left ventricular    diastolic dysfunction, grade indeterminate. Doppler parameters    are consistent with high ventricular filling pressure.  - Mitral valve: Mildly calcified annulus. There was mild    regurgitation.  - Left atrium: The atrium was severely dilated.  - Right atrium: The atrium was mildly dilated.  - Inferior vena cava: The vessel was dilated. The respirophasic    diameter changes were blunted (< 50%), consistent with elevated    central venous pressure. Estimated CVP 15 mmHg.    Recent Labs: 12/14/2020: TSH 2.018 03/23/2021: ALT 10; Magnesium 2.0 03/29/2021: BUN 43; Creatinine, Ser 3.52; Hemoglobin 10.3; Platelets 322; Potassium 4.8; Sodium 141   Recent Lipid Panel    Component Value Date/Time   CHOL 168 12/25/2020 1208   TRIG 272 (H) 12/25/2020 1208   HDL 48 12/25/2020 1208   CHOLHDL 3.5 12/25/2020 1208   CHOLHDL 3.9 04/26/2018 1011   VLDL 20 04/26/2018 1011   LDLCALC 76 12/25/2020 1208    Physical Exam:    VS:  There were no vitals taken for this visit. , BMI There is no height or weight on file to calculate BMI. GENERAL:  Well appearing HEENT: Oral mucosa unremarkable NECK:  No jugular venous distention, waveform within normal limits, carotid upstroke brisk and  symmetric, no bruits, no thyromegaly LUNGS:  Clear to auscultation bilaterally HEART:  RRR.  PMI not displaced or sustained,S1 and S2 within normal limits, no S3, no S4, no clicks, no rubs, no murmurs ABD:  Flat, positive bowel sounds normal in frequency in pitch, no bruits, no rebound, no guarding, no midline pulsatile mass, no hepatomegaly, no splenomegaly EXT:  2 plus pulses throughout, no edema, no cyanosis no clubbing SKIN:  No rashes no nodules NEURO:  Cranial nerves II through XII grossly intact, motor grossly intact throughout PSYCH:  Cognitively intact, oriented to person place and time   ASSESSMENT/PLAN:   No problem-specific Assessment & Plan notes found for this encounter.   No diagnosis found.    Disposition: FU with Tiffany C. Oval Linsey, MD, [facility] in ***   Medication Adjustments/Labs and Tests Ordered: Current medicines are reviewed at length with the patient today.  Concerns regarding medicines are outlined above.  No orders of the defined types were placed in this encounter.  No orders of the defined types were placed in this encounter.   I,Mykaella Javier,acting as a scribe for Skeet Latch, MD.,have documented all relevant documentation on the behalf of Skeet Latch, MD,as directed by  Skeet Latch, MD while in the presence of Skeet Latch, MD.  ***  Signed, Orion Crook  05/17/2021 10:15 AM    [place]

## 2021-05-17 NOTE — Telephone Encounter (Signed)
Just an update of information for this patients upcoming virtual visit

## 2021-05-26 DIAGNOSIS — C159 Malignant neoplasm of esophagus, unspecified: Secondary | ICD-10-CM | POA: Diagnosis not present

## 2021-05-31 ENCOUNTER — Other Ambulatory Visit: Payer: Self-pay

## 2021-05-31 ENCOUNTER — Ambulatory Visit (INDEPENDENT_AMBULATORY_CARE_PROVIDER_SITE_OTHER): Payer: Medicare HMO | Admitting: Cardiovascular Disease

## 2021-05-31 ENCOUNTER — Encounter (HOSPITAL_BASED_OUTPATIENT_CLINIC_OR_DEPARTMENT_OTHER): Payer: Self-pay | Admitting: Cardiovascular Disease

## 2021-05-31 VITALS — BP 178/106 | HR 87 | Ht 70.0 in | Wt 199.6 lb

## 2021-05-31 DIAGNOSIS — I739 Peripheral vascular disease, unspecified: Secondary | ICD-10-CM | POA: Diagnosis not present

## 2021-05-31 DIAGNOSIS — I25708 Atherosclerosis of coronary artery bypass graft(s), unspecified, with other forms of angina pectoris: Secondary | ICD-10-CM

## 2021-05-31 DIAGNOSIS — Z79899 Other long term (current) drug therapy: Secondary | ICD-10-CM | POA: Diagnosis not present

## 2021-05-31 DIAGNOSIS — F32A Depression, unspecified: Secondary | ICD-10-CM

## 2021-05-31 DIAGNOSIS — I10 Essential (primary) hypertension: Secondary | ICD-10-CM

## 2021-05-31 DIAGNOSIS — I493 Ventricular premature depolarization: Secondary | ICD-10-CM

## 2021-05-31 DIAGNOSIS — I2510 Atherosclerotic heart disease of native coronary artery without angina pectoris: Secondary | ICD-10-CM | POA: Diagnosis not present

## 2021-05-31 DIAGNOSIS — F339 Major depressive disorder, recurrent, unspecified: Secondary | ICD-10-CM

## 2021-05-31 DIAGNOSIS — R69 Illness, unspecified: Secondary | ICD-10-CM | POA: Diagnosis not present

## 2021-05-31 HISTORY — DX: Depression, unspecified: F32.A

## 2021-05-31 NOTE — Progress Notes (Signed)
Advanced Hypertension Clinic Follow up    Date:  05/31/2021   ID:  [name], DOB [birth year], MRN [id number]  PCP:  [name], MD  Cardiologist:  Skeet Latch, MD  Nephrologist:  Referring MD: [name], MD   CC: Hypertension  History of Present Illness:    [name] is a 65 y.o. male with a hx of CAD status post CABG, chronic systolic and diastolic heart failure, hypertension, hyperlipidemia, diabetes, peripheral arterial disease, esophageal cancer, and CKD 3 here for follow-up. He initially established care in the hypertension clinic 10/14/2020. He had a four-vessel CABG on 03/2019.  He has struggled with poorly controlled blood pressures and his blood pressure has been as high as the 200s over 100s.  He has not tolerated amlodipine, lisinopril, HCTZ, or beta-blockers due to them making him feel fatigued.  He saw [name] on 7/23 and his blood pressure was 172/88.  He was referred to advanced hypertension clinic for further evaluation and management.  He notes that his BP is labile.   At his intial appointment he reported shortness of breath and lightheadedness.  Dr. Moshe Cipro started him on lasix which has helped his edema.  This was increased to 7m daily after a hospitalization for acute on chronic diastolic heart failure.  He has been working on his diet.  He limits carbohydrates and has a high protein diet.  He limits red meat.  He drinks a lot of water and limits soda.  He quit smoking 25-30 years ago.  He smoked 2ppd x10-15 years.   At his initial appointment he was started on doxazosin.  He followed up with our pharmacist and started on valsartan. He had orthostatic drops in his blood pressures to the 90s, so doxazosin was reduced. He followed up with our pharmacist 08/2020 and his blood pressure was in the 140s but no changes were made due to his issues with low blood pressure in the past.   He saw CLaurann Montana NP in 01/2021 after wearing a Zio  monitor that showed 10 episodes of SVT and 3 short runs of NSVT. He reported feeling lightheaded and generally unwell. He was referred for a cardiac Cath, but because of his kidney function had a nuclear stress test instead. His stress showed LVEF 28% with prior infarct and peri-infarct ischemia in the inferior and inferolateral regions.  At his last appointment, he burst a blood vessel in his L eye 2.5 weeks prior while bending over and could only see limited shapes and outlines. His blood pressure at home improved but continued to fluctuate. Since his last appointment he was diagnosed with esophageal cancer. 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   Today, he is accompanied by his wife. Overall he is feeling alright but fatigued. Lately his energy has been gradually decreasing, and he will sometimes feel better lying down. He becomes fatigued with minimal walking. He has been feeling depressed concerning his cancer and possible esophagectomy. Recently he had another episode of bleeding with emesis around the time of his endoscopy. Lately, he has also noticed his heart skipping frequently, especially at night. Typically his blood pressure has been in the 130s at home. He reports that he has collapsed 3 times due to hypotension as low as the 758Nsystolic. This has occurred after taking his medication in the morning, and after he eats. While eating he denies any chest pain. He  having a healthy appetite. His acid reflux is causing him to aspirate. Previously he was feeling fluid in his chest and having difficulty breathing at night. He dislikes staying in bed, and will try to move around when he is able. He is receiving injections to help his vision, due to his diabetes. He denies any chest pain, or shortness of breath. No lightheadedness, headaches, syncope, orthopnea, PND, lower extremity edema or exertional  symptoms. 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  ° 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  ° ° °Past Surgical History:  °Procedure Laterality Date  ° BIOPSY  06/27/2019  ° Procedure: BIOPSY;  Surgeon: [name], Najeeb U, MD;  [facility];  Service: Endoscopy;;  ascending colon polyp  ° BIOPSY  10/21/2020  ° Procedure: BIOPSY;  Surgeon: [name], Najeeb U, MD;  [facility]: [facility];  Service: Endoscopy;;  esophageal,gastric polyp  ° BIOPSY  02/24/2021  ° Procedure: BIOPSY;  Surgeon: [name], Najeeb U, MD;  [facility]: [facility];  Service: Endoscopy;;  distal and proximal esophageal biopsies   ° CARDIAC SURGERY    ° CATARACT EXTRACTION    ° COLONOSCOPY N/A 06/27/2019  ° [name]:Diverticulosis in the entire examined colon. tubular adenoma in ascending colon, 4mm tubular adenoma in prox sigmoid. external hemorrhoids  ° CORONARY ARTERY BYPASS GRAFT N/A 03/25/2019  ° Procedure: CORONARY ARTERY BYPASS GRAFTING (CABG) X  4 USING LEFT INTERNAL MAMMARY ARTERY AND RIGHT SAPHENOUS VEIN GRAFTS;  Surgeon: Lightfoot, Harrell O, MD;  [facility]: [facility];  Service: Open Heart Surgery;  Laterality: N/A;  ° ESOPHAGOGASTRODUODENOSCOPY (EGD) WITH PROPOFOL N/A 10/21/2020  ° rehman:Normal hypopharynx.Normal proximal esophagus and mid esophagus.Esophageal mucosal changes consistent with long-segment Barrett's esophagus. (Focal low-grade dysplasia and atypia proximally) z line irregular 37 cm from incisors, 3 cm HH, single gastric polyp (fundic gland) normal  duodenal bulb/second portion of duodenum, proximal margin of Barrett's at 34 cm  ° ESOPHAGOGASTRODUODENOSCOPY (EGD) WITH PROPOFOL N/A 02/24/2021  ° Procedure: ESOPHAGOGASTRODUODENOSCOPY (EGD) WITH PROPOFOL;  Surgeon: [name], Najeeb U, MD;  [facility];  Service: Endoscopy;  Laterality: N/A;  1:35  ° ESOPHAGOGASTRODUODENOSCOPY (EGD) WITH PROPOFOL N/A 03/21/2021  ° Procedure: ESOPHAGOGASTRODUODENOSCOPY (EGD) WITH PROPOFOL;  Surgeon: Hung, Patrick, MD;  [facility];  Service: Endoscopy;  Laterality: N/A;  ° EXCISION MORTON'S NEUROMA    ° EYE SURGERY Left   ° retina  ° POLYPECTOMY  06/27/2019  ° Procedure: POLYPECTOMY;  Surgeon: [name], Najeeb U, MD;  [facility];  Service: Endoscopy;;  proximal sigmoid colon  ° RIGHT/LEFT HEART CATH AND CORONARY ANGIOGRAPHY N/A 03/12/2019  ° Procedure: RIGHT/LEFT HEART CATH AND CORONARY ANGIOGRAPHY;  Surgeon: Varanasi, Jayadeep S, MD;  [facility];  Service: Cardiovascular;  Laterality: N/A;  ° SHOULDER ARTHROSCOPY WITH ROTATOR CUFF REPAIR AND SUBACROMIAL DECOMPRESSION Right 08/26/2020  ° Procedure: RIGHT SHOULDER MINI OPEN ROTATOR CUFF REPAIR AND SUBACROMIAL DECOMPRESSION WITH PATCH GRAFT;  Surgeon: Beane, Jeffrey, MD;  Location: [facility];  Service: Orthopedics;  Laterality: Right;  90 MINS °GENERAL WITH BLOCK  °   TEE WITHOUT CARDIOVERSION N/A 03/25/2019  ° Procedure: TRANSESOPHAGEAL ECHOCARDIOGRAM (TEE);  Surgeon: Lightfoot, Harrell O, MD;  Location: [facility];  Service: Open Heart Surgery;  Laterality: N/A;  ° ° °Current Medications: °Current Meds  °Medication Sig  ° acetaminophen (TYLENOL) 500 MG tablet Take 1,000 mg by mouth every 6 (six) hours as needed for moderate pain or headache.   ° albuterol (VENTOLIN HFA) 108 (90 Base) MCG/ACT inhaler Inhale 2 puffs into the lungs every 6 (six) hours as needed. (Patient taking differently: Inhale 2 puffs into the lungs every 6 (six) hours as needed for wheezing or shortness of breath.)  °  Alirocumab (PRALUENT) 150 MG/ML SOAJ Inject 150 mg into the skin every 14 (fourteen) days.  ° aspirin EC 81 MG tablet Take 4 tablets (325 mg total) by mouth daily. Swallow whole.  ° carvedilol (COREG) 12.5 MG tablet Take 1 tablet (12.5 mg total) by mouth 2 (two) times daily.  ° doxazosin (CARDURA) 4 MG tablet Take 4 mg by mouth at bedtime.  ° EPINEPHrine 0.3 mg/0.3 mL IJ SOAJ injection Inject 0.3 mg into the muscle as needed for anaphylaxis.  ° furosemide (LASIX) 40 MG tablet Take 40 mg by mouth daily.  ° isosorbide-hydrALAZINE (BIDIL) 20-37.5 MG tablet Take 1 tablet by mouth in the morning and at bedtime. (Patient taking differently: Take 1 tablet by mouth daily.)  ° OZEMPIC, 1 MG/DOSE, 4 MG/3ML SOPN Inject 1 mg into the skin once a week.  ° pantoprazole (PROTONIX) 40 MG tablet Take 1 tablet (40 mg total) by mouth 2 (two) times daily before a meal.  ° sertraline (ZOLOFT) 100 MG tablet TAKE 1 TABLET BY MOUTH DAILY  ° °Current Facility-Administered Medications for the 05/31/21 encounter (Office Visit) with [place], , MD  °Medication  ° sodium chloride flush (NS) 0.9 % injection 3 mL  °  ° °Allergies:   Bee venom, Atorvastatin, Diltiazem, Rosuvastatin, and Valsartan  ° °Social History  ° °Socioeconomic History  ° Marital status: Single  °  Spouse name: Not on file  ° Number of children: Not on file  ° Years of education: Not on file  ° Highest education level: Not on file  °Occupational History  ° Not on file  °Tobacco Use  ° Smoking status: Former  °  Types: Cigarettes  °  Quit date: 07/06/1988  °  Years since quitting: 32.9  ° Smokeless tobacco: Never  °Vaping Use  ° Vaping Use: Never used  °Substance and Sexual Activity  ° Alcohol use: Not Currently  °  Alcohol/week: 2.0 standard drinks  °  Types: 2 Glasses of wine per week  °  Comment: socially  ° Drug use: No  ° Sexual activity: Not on file  °Other Topics Concern  ° Not on file  °Social History Narrative  ° Not on file  ° °Social Determinants of Health   ° °Financial Resource Strain: Not on file  °Food Insecurity: Not on file  °Transportation Needs: Not on file  °Physical Activity: Not on file  °Stress: Not on file  °Social Connections: Not on file  °  ° °Family History: °The patient's family history includes Colon cancer in his mother; Diabetes in his father; Heart Problems in his father; Valvular heart disease in his father. ° °ROS:   °Please see the history of present illness.    °(+) Fatigue °(+) Malaise °(+) Depression °(+) Hypotensive collapse x3 °(+) Acid reflux °(+) Aspiration °(+) GI bleed °(+) Palpitations °All other systems reviewed and are negative. ° °  EKGs/Labs/Other Studies Reviewed:    EKG:     05/31/2021: Sinus rhythm. Rate 89 bpm.  PVC 02/23/2021: EKG was not ordered. 10/14/2020: EKG was not ordered. 06/17/2020: EKG was not ordered. 01/01/20: Sinus rhythm.  Rate 87 bpm.  Nonspecific T wave abnormalities.   NM Stress 02/12/2021: Narrative & Impression      Findings are consistent with prior myocardial infarction with peri-infarct ischemia. The study is high risk.   No ST deviation was noted.   LV perfusion is abnormal. There is evidence of ischemia. There is evidence of infarction. Defect 1: There is a medium defect with moderate reduction in uptake present in the mid to basal inferior and inferolateral location(s) that is fixed. There is abnormal wall motion in the defect area. Consistent with infarction and peri-infarct ischemia.   Left ventricular function is abnormal. Global function is severely reduced. There was a single regional abnormality. Nuclear stress EF: 28 %. The left ventricular ejection fraction is severely decreased (<30%). End diastolic cavity size is moderately enlarged. End systolic cavity size is moderately enlarged.   Prior study available for comparison from 09/25/2019. There are changes compared to prior study. The left ventricular ejection fraction has decreased. LVEF 35%, moderate size and intensity fixed inferior  defect suggestive of scar with mild peri-infarct ischemia   Moderate size and intensity, mostly fixed basal to mid inferior and inferolateral perfusion defect (SDS 1), suggestive of scar with minimal peri-infarct ischemia. LVEF 28% with global hypokinesis and basal to mid inferior akinesis. This is a high risk study. In comparison with a prior study in 2021, the LVEF is lower (was previously 35%), however, there are no new reversible perfusion defects.    Echo 02/04/2021:  1. Left ventricular ejection fraction, by estimation, is 45 to 50%. The  left ventricle has mildly decreased function. The left ventricle  demonstrates regional wall motion abnormalities (see scoring  diagram/findings for description). The left ventricular   internal cavity size was moderately to severely dilated. Left ventricular  diastolic parameters are consistent with Grade I diastolic dysfunction  (impaired relaxation). There is severe dyskinesis of the left ventricular,  basal-mid inferior wall.   2. Right ventricular systolic function is normal. The right ventricular  size is normal.   3. Left atrial size was severely dilated.   4. Right atrial size was mildly dilated.   5. The mitral valve is normal in structure. Mild mitral valve  regurgitation.   6. The aortic valve is grossly normal. Aortic valve regurgitation is not  visualized.   7-Day Monitor 01/06/2021: 7 Day Event Monitor   Quality: Fair.  Baseline artifact. Predominant rhythm: sinus rhythm Average heart rate: 83 bpm Max heart rate: 132 bpm Min heart rate: 67 bpm Pauses >2.5 seconds: none   Frequent PVCs (3.4%) 3 episodes of NSVT up to 5 beats  Up to 12 beats of SVT  Echo 09/2019:  1. Left ventricular ejection fraction, by estimation, is 55 to 60%. The  left ventricle has normal function. The left ventricle has no regional  wall motion abnormalities. There is moderate left ventricular hypertrophy.  Left ventricular diastolic  parameters are  consistent with Grade I diastolic dysfunction (impaired  relaxation).   2. Right ventricular systolic function is normal. The right ventricular  size is normal.   3. Left atrial size was severely dilated.   4. The mitral valve is normal in structure. Trivial mitral valve  regurgitation. No evidence of mitral stenosis.   5. The aortic valve was not  well visualized. Aortic valve regurgitation  °is not visualized. No aortic stenosis is present.  ° 6. The inferior vena cava is normal in size with greater than 50%  °respiratory variability, suggesting right atrial pressure of 3 mmHg.  ° °Echo 05/29/2019 °1. Left ventricular ejection fraction, by visual estimation, is 50 to  °55%. The left ventricle has low normal function. There is mildly increased  °left ventricular hypertrophy.  ° 2. Basal anterolateral segment, basal inferior segment, and basal  °inferolateral segment are abnormal.  ° 3. The left ventricle demonstrates regional wall motion abnormalities.  ° 4. Global right ventricle has mildly reduced systolic function.The right  °ventricular size is normal. No increase in right ventricular wall  °thickness.  ° 5. Left atrial size was moderately dilated.  ° 6. Right atrial size was normal.  ° 7. Moderate mitral annular calcification.  ° 8. The mitral valve is grossly normal. Trivial mitral valve  °regurgitation.  ° 9. The tricuspid valve is grossly normal. Tricuspid valve regurgitation  °is trivial.  °10. The aortic valve is tricuspid. Aortic valve regurgitation is not  °visualized.  °11. The pulmonic valve was grossly normal. Pulmonic valve regurgitation is  °trivial.  °12. Normal pulmonary artery systolic pressure.  °13. The tricuspid regurgitant velocity is 2.30 m/s, and with an assumed  °right atrial pressure of 3 mmHg, the estimated right ventricular systolic  °pressure is normal at 24.2 mmHg.  °14. The inferior vena cava is normal in size with greater than 50%  °respiratory variability, suggesting right  atrial pressure of 3 mmHg.  ° °VAS US LOWER EXTREMITY VENOUS 03/30/2019: °Right: There is no evidence of deep vein thrombosis in the lower  °extremity.  °Left: No evidence of common femoral vein obstruction. ° °ECHO INTRAOPERATIVE TEE 03/25/2019: °POST-OP IMPRESSIONS  °- Aorta: The aorta appears unchanged from pre-bypass.  °- Left Atrial Appendage: The left atrial appendage appears unchanged from  °pre-bypass.  °- Aortic Valve: The aortic valve appears unchanged from pre-bypass.  °- Mitral Valve: The mitral valve appears unchanged from pre-bypass.  °- Tricuspid Valve: The tricuspid valve appears unchanged from pre-bypass.  °- Pericardium: The pericardium appears unchanged from pre-bypass. ° °VAS US DOPPLER PRE CABG 03/21/2019:  °Right Carotid: Velocities in the right ICA are consistent with a 1-39%  °stenosis.  ° °Left Carotid: There is no evidence of stenosis in the left ICA.  °Vertebrals: Bilateral vertebral arteries demonstrate antegrade flow.  ° °Bilateral Extremity: Doppler waveforms remain within normal limits with  °compression bilaterally for the radial arteries. Doppler waveforms remain  °within normal limits with compression bilaterally for the ulnar arteries.  ° °Echo 03/15/2019 ° 1. Left ventricular ejection fraction, by visual estimation, is 45 to  °50%. The left ventricle has mildly decreased function. There is mildly  °increased left ventricular hypertrophy.  ° 2. Left ventricular diastolic Doppler parameters are consistent with  °impaired relaxation pattern of LV diastolic filling.  ° 3. Global right ventricle has normal systolic function.The right  °ventricular size is normal. No increase in right ventricular wall  °thickness.  ° 4. Left atrial size was severely dilated.  ° 5. Right atrial size was normal.  ° 6. Mild mitral annular calcification.  ° 7. Moderate calcification of the mitral valve leaflet(s).  ° 8. Moderate thickening of the mitral valve leaflet(s).  ° 9. The mitral valve is abnormal.  Trace mitral valve regurgitation. No  °evidence of mitral stenosis.  °10. The tricuspid valve is normal in structure. Tricuspid valve  °regurgitation is   trivial.  11. The aortic valve is tricuspid Aortic valve regurgitation was not  visualized by color flow Doppler. Mild aortic valve sclerosis without  stenosis.  12. The pulmonic valve was not well visualized. Pulmonic valve  regurgitation is mild by color flow Doppler.  13. Mildly elevated pulmonary artery systolic pressure.   Right/Left Heart Cath and Coronary Angiography 03/12/2019: Mid LAD lesion is 80% stenosed. Ost LAD to Mid LAD lesion is 50% stenosed. 1st Diag lesion is 80% stenosed. 2nd Diag lesion is 75% stenosed. Mid LM to Dist LM lesion is 25% stenosed. Prox Cx lesion is 90% stenosed. 1st Mrg lesion is 80% stenosed. 2nd Mrg lesion is 80% stenosed. Dist RCA lesion is 90% stenosed. Mid RCA lesion is 75% stenosed. RPAV lesion is 80% stenosed. LV end diastolic pressure is moderately elevated. There is no aortic valve stenosis. Hemodynamic findings consistent with mild pulmonary hypertension.   Severe multivessel CAD.  Plan for outpatient evaluation for CABG>  His symptoms are all with exertion.  He feels fine at rest.  I instructed him to avoid any strenuous activity, or anything that would bring on shortness of breath.  Continue aspirin and Imdur.   Continue Crestor.   Echo 04/26/2018: - Left ventricle: The cavity size was normal. There was minimal    concentric hypertrophy. Systolic function was mildly reduced. The    estimated ejection fraction was in the range of 45% to 50%.    Diffuse hypokinesis. Findings consistent with left ventricular    diastolic dysfunction, grade indeterminate. Doppler parameters    are consistent with high ventricular filling pressure.  - Mitral valve: Mildly calcified annulus. There was mild    regurgitation.  - Left atrium: The atrium was severely dilated.  - Right atrium: The atrium was  mildly dilated.  - Inferior vena cava: The vessel was dilated. The respirophasic    diameter changes were blunted (< 50%), consistent with elevated    central venous pressure. Estimated CVP 15 mmHg.    Recent Labs: 12/14/2020: TSH 2.018 03/23/2021: ALT 10; Magnesium 2.0 03/29/2021: BUN 43; Creatinine, Ser 3.52; Hemoglobin 10.3; Platelets 322; Potassium 4.8; Sodium 141   Recent Lipid Panel    Component Value Date/Time   CHOL 168 12/25/2020 1208   TRIG 272 (H) 12/25/2020 1208   HDL 48 12/25/2020 1208   CHOLHDL 3.5 12/25/2020 1208   CHOLHDL 3.9 04/26/2018 1011   VLDL 20 04/26/2018 1011   LDLCALC 76 12/25/2020 1208    Physical Exam:    VS:  BP (!) 178/106    Pulse 87    Ht 5' 10" (1.778 m)    Wt 199 lb 9.6 oz (90.5 kg)    SpO2 97%    BMI 28.64 kg/m  , BMI Body mass index is 28.64 kg/m. GENERAL:  Well appearing HEENT: Oral mucosa unremarkable NECK:  No jugular venous distention, waveform within normal limits, carotid upstroke brisk and symmetric, no bruits, no thyromegaly LUNGS:  Clear to auscultation bilaterally HEART:  Mostly regular with frequent ectopy. PMI not displaced or sustained,S1 and S2 within normal limits, no S3, no S4, no clicks, no rubs, no murmurs ABD:  Flat, positive bowel sounds normal in frequency in pitch, no bruits, no rebound, no guarding, no midline pulsatile mass, no hepatomegaly, no splenomegaly EXT:  2 plus pulses throughout, no edema, no cyanosis no clubbing SKIN:  No rashes no nodules NEURO:  Cranial nerves II through XII grossly intact, motor grossly intact throughout PSYCH:  Cognitively intact, oriented to person  place and time ° ° °ASSESSMENT/PLAN:   ° °Essential hypertension, benign °BP poorly controlled.  He has struggled with labile BP.  His BP drops after eating breakfast in the morning.  He has no dysphagia.  It is unclear why his BP drops so much after eating.  He will start taking his morning medicine after he eats.  He has been taking the BiDil in  the morning and holding his evening dose.  Since his symptoms seem to be occurring mostly in the morning he will hold the morning dose for now and start back taking it at night.  If his blood pressure continues to be elevated and his symptoms improved by taking his medicine after breakfast, that he can add back the morning dose.  Continue carvedilol and doxazosin. ° °Chronic combined systolic and diastolic heart failure (HCC) °LVEF 28%.  He is euvolemic on exam today.  Continue carvedilol.  Given his heart failure and obstructive coronary disease as well as chronic kidney disease, he would be high risk for a major intra-abdominal/intrathoracic procedure.  Agree with his team at [facility] about doing the least invasive approach possible. ° °Peripheral arterial disease (HCC) °He is stable and has no claudication.  Continue Praluent, aspirin, and carvedilol. ° °Coronary artery disease involving coronary bypass graft of native heart with other forms of angina pectoris (HCC) °He currently has no anginal symptoms.  Lexiscan Myoview showed infarct with mild peri-infarct ischemia.  Medically managed given his CKD and lack of symptoms.  This does raise his risk for large surgical procedures.  Continue aspirin, Praluent, carvedilol.   ° ° ° °1. Essential hypertension, benign   °2. PVC (premature ventricular contraction)   °3. Coronary artery disease involving native coronary artery of native heart without angina pectoris   °4. Medication management   °5. Peripheral arterial disease (HCC)   °6. Depression, unspecified depression type   °7. Coronary artery disease involving coronary bypass graft of native heart with other forms of angina pectoris (HCC)   ° ° °Disposition:    °FU with MD/PharmD in 2 months. ° ° °Medication Adjustments/Labs and Tests Ordered: °Current medicines are reviewed at length with the patient today.  Concerns regarding medicines are outlined above.  ° °Orders Placed This Encounter  °Procedures  ° CBC  ° TSH  °  Comp Met (CMET)  ° Magnesium  ° Ambulatory referral to Psychology  ° °No orders of the defined types were placed in this encounter. ° ° °I,Mathew Stumpf,acting as a scribe for  [place], MD.,have documented all relevant documentation on the behalf of  [place], MD,as directed by   [place], MD while in the presence of  [place], MD. ° °I,  C. [place], MD have reviewed all documentation for this visit.  The documentation of the exam, diagnosis, procedures, and orders on 05/31/2021 are all accurate and complete. ° ° °Signed, ° [place], MD  °05/31/2021 1:15 PM    °[facility] ° °

## 2021-05-31 NOTE — Assessment & Plan Note (Signed)
[name] has struggled for a long time with depression.  Symptoms are worse now given his multiple medical issues this year.  Continue Zoloft. We will refer him to Dr. Michail Sermon.

## 2021-05-31 NOTE — Assessment & Plan Note (Signed)
BP poorly controlled.  He has struggled with labile BP.  His BP drops after eating breakfast in the morning.  He has no dysphagia.  It is unclear why his BP drops so much after eating.  He will start taking his morning medicine after he eats.  He has been taking the BiDil in the morning and holding his evening dose.  Since his symptoms seem to be occurring mostly in the morning he will hold the morning dose for now and start back taking it at night.  If his blood pressure continues to be elevated and his symptoms improved by taking his medicine after breakfast, that he can add back the morning dose.  Continue carvedilol and doxazosin.

## 2021-05-31 NOTE — Assessment & Plan Note (Signed)
He is stable and has no claudication.  Continue Praluent, aspirin, and carvedilol.

## 2021-05-31 NOTE — Assessment & Plan Note (Signed)
LVEF 28%.  He is euvolemic on exam today.  Continue carvedilol.  Given his heart failure and obstructive coronary disease as well as chronic kidney disease, he would be high risk for a major intra-abdominal/intrathoracic procedure.  Agree with his team at [facility] about doing the least invasive approach possible.

## 2021-05-31 NOTE — Patient Instructions (Addendum)
Medication Instructions:  Take- Isosorbide-Hydralazine(Bidil) 20-37.5 mg by mouth in the evening  *If you need a refill on your cardiac medications before your next appointment, please call your pharmacy*   Lab Work: TSH, CMP, CBC and Magnesium  If you have labs (blood work) drawn today and your tests are completely normal, you will receive your results only by: [place] (if you have MyChart) OR A paper copy in the mail If you have any lab test that is abnormal or we need to change your treatment, we will call you to review the results.   Testing/Procedures: None Ordered   Follow-Up: At [facility], you and your health needs are our priority.  As part of our continuing mission to provide you with exceptional heart care, we have created designated Provider Care Teams.  These Care Teams include your primary Cardiologist (physician) and Advanced Practice Providers (APPs -  Physician Assistants and Nurse Practitioners) who all work together to provide you with the care you need, when you need it.  We recommend signing up for the patient portal called "MyChart".  Sign up information is provided on this After Visit Summary.  MyChart is used to connect with patients for Virtual Visits (Telemedicine).  Patients are able to view lab/test results, encounter notes, upcoming appointments, etc.  Non-urgent messages can be sent to your provider as well.   To learn more about what you can do with MyChart, go to [email or web address].    Your next appointment:   2 month(s)  The format for your next appointment:   In Person  Provider:   [name], NP    OTHER:   You have been referred to Dr Michail Sermon Pyschology

## 2021-05-31 NOTE — Assessment & Plan Note (Signed)
He currently has no anginal symptoms.  Lexiscan Myoview showed infarct with mild peri-infarct ischemia.  Medically managed given his CKD and lack of symptoms.  This does raise his risk for large surgical procedures.  Continue aspirin, Praluent, carvedilol.

## 2021-06-01 LAB — COMPREHENSIVE METABOLIC PANEL
ALT: 6 IU/L (ref 0–44)
AST: 10 IU/L (ref 0–40)
Albumin/Globulin Ratio: 1.8 (ref 1.2–2.2)
Albumin: 4.3 g/dL (ref 3.8–4.8)
Alkaline Phosphatase: 84 IU/L (ref 44–121)
BUN/Creatinine Ratio: 9 — ABNORMAL LOW (ref 10–24)
BUN: 28 mg/dL — ABNORMAL HIGH (ref 8–27)
Bilirubin Total: 0.3 mg/dL (ref 0.0–1.2)
CO2: 21 mmol/L (ref 20–29)
Calcium: 9.1 mg/dL (ref 8.6–10.2)
Chloride: 106 mmol/L (ref 96–106)
Creatinine, Ser: 3.19 mg/dL — ABNORMAL HIGH (ref 0.76–1.27)
Globulin, Total: 2.4 g/dL (ref 1.5–4.5)
Glucose: 190 mg/dL — ABNORMAL HIGH (ref 70–99)
Potassium: 4.6 mmol/L (ref 3.5–5.2)
Sodium: 139 mmol/L (ref 134–144)
Total Protein: 6.7 g/dL (ref 6.0–8.5)
eGFR: 21 mL/min/{1.73_m2} — ABNORMAL LOW (ref >59)

## 2021-06-01 LAB — CBC
Hematocrit: 34.2 % — ABNORMAL LOW (ref 37.5–51.0)
Hemoglobin: 11.8 g/dL — ABNORMAL LOW (ref 13.0–17.7)
MCH: 29.4 pg (ref 26.6–33.0)
MCHC: 34.5 g/dL (ref 31.5–35.7)
MCV: 85 fL (ref 79–97)
Platelets: 291 10*3/uL (ref 150–450)
RBC: 4.01 x10E6/uL — ABNORMAL LOW (ref 4.14–5.80)
RDW: 13.3 % (ref 11.6–15.4)
WBC: 5.9 10*3/uL (ref 3.4–10.8)

## 2021-06-01 LAB — TSH: TSH: 1.89 u[IU]/mL (ref 0.450–4.500)

## 2021-06-01 LAB — MAGNESIUM: Magnesium: 2.3 mg/dL (ref 1.6–2.3)

## 2021-06-01 NOTE — Addendum Note (Signed)
Addended by: Vennie Homans on: 06/01/2021 07:58 AM   Modules accepted: Orders

## 2021-06-08 ENCOUNTER — Encounter (HOSPITAL_COMMUNITY): Payer: Self-pay | Admitting: Emergency Medicine

## 2021-06-08 ENCOUNTER — Other Ambulatory Visit: Payer: Self-pay

## 2021-06-08 ENCOUNTER — Emergency Department (HOSPITAL_COMMUNITY)
Admission: EM | Admit: 2021-06-08 | Discharge: 2021-06-09 | Disposition: A | Discharge status: Home / Self Care | Payer: Medicare HMO | Attending: Emergency Medicine | Admitting: Emergency Medicine

## 2021-06-08 ENCOUNTER — Telehealth: Payer: Self-pay | Admitting: Family Medicine

## 2021-06-08 DIAGNOSIS — I252 Old myocardial infarction: Secondary | ICD-10-CM | POA: Insufficient documentation

## 2021-06-08 DIAGNOSIS — T24301A Burn of third degree of unspecified site of right lower limb, except ankle and foot, initial encounter: Secondary | ICD-10-CM | POA: Diagnosis not present

## 2021-06-08 DIAGNOSIS — I5042 Chronic combined systolic (congestive) and diastolic (congestive) heart failure: Secondary | ICD-10-CM | POA: Diagnosis not present

## 2021-06-08 DIAGNOSIS — E11319 Type 2 diabetes mellitus with unspecified diabetic retinopathy without macular edema: Secondary | ICD-10-CM | POA: Diagnosis not present

## 2021-06-08 DIAGNOSIS — E119 Type 2 diabetes mellitus without complications: Secondary | ICD-10-CM | POA: Diagnosis not present

## 2021-06-08 DIAGNOSIS — Z85038 Personal history of other malignant neoplasm of large intestine: Secondary | ICD-10-CM | POA: Diagnosis not present

## 2021-06-08 DIAGNOSIS — I10 Essential (primary) hypertension: Secondary | ICD-10-CM | POA: Diagnosis not present

## 2021-06-08 DIAGNOSIS — T24201A Burn of second degree of unspecified site of right lower limb, except ankle and foot, initial encounter: Secondary | ICD-10-CM

## 2021-06-08 DIAGNOSIS — I130 Hypertensive heart and chronic kidney disease with heart failure and stage 1 through stage 4 chronic kidney disease, or unspecified chronic kidney disease: Secondary | ICD-10-CM | POA: Insufficient documentation

## 2021-06-08 DIAGNOSIS — E1159 Type 2 diabetes mellitus with other circulatory complications: Secondary | ICD-10-CM | POA: Insufficient documentation

## 2021-06-08 DIAGNOSIS — Z23 Encounter for immunization: Secondary | ICD-10-CM | POA: Diagnosis not present

## 2021-06-08 DIAGNOSIS — Z794 Long term (current) use of insulin: Secondary | ICD-10-CM | POA: Insufficient documentation

## 2021-06-08 DIAGNOSIS — Z7982 Long term (current) use of aspirin: Secondary | ICD-10-CM | POA: Diagnosis not present

## 2021-06-08 DIAGNOSIS — T24331A Burn of third degree of right lower leg, initial encounter: Secondary | ICD-10-CM | POA: Diagnosis present

## 2021-06-08 DIAGNOSIS — Z87891 Personal history of nicotine dependence: Secondary | ICD-10-CM | POA: Diagnosis not present

## 2021-06-08 DIAGNOSIS — E1121 Type 2 diabetes mellitus with diabetic nephropathy: Secondary | ICD-10-CM | POA: Insufficient documentation

## 2021-06-08 DIAGNOSIS — X088XXA Exposure to other specified smoke, fire and flames, initial encounter: Secondary | ICD-10-CM | POA: Diagnosis not present

## 2021-06-08 DIAGNOSIS — N184 Chronic kidney disease, stage 4 (severe): Secondary | ICD-10-CM | POA: Insufficient documentation

## 2021-06-08 DIAGNOSIS — Z951 Presence of aortocoronary bypass graft: Secondary | ICD-10-CM | POA: Diagnosis not present

## 2021-06-08 DIAGNOSIS — T310 Burns involving less than 10% of body surface: Secondary | ICD-10-CM | POA: Diagnosis not present

## 2021-06-08 DIAGNOSIS — I2510 Atherosclerotic heart disease of native coronary artery without angina pectoris: Secondary | ICD-10-CM | POA: Insufficient documentation

## 2021-06-08 DIAGNOSIS — Z79899 Other long term (current) drug therapy: Secondary | ICD-10-CM | POA: Insufficient documentation

## 2021-06-08 DIAGNOSIS — E1122 Type 2 diabetes mellitus with diabetic chronic kidney disease: Secondary | ICD-10-CM | POA: Diagnosis not present

## 2021-06-08 MED ORDER — CARVEDILOL 12.5 MG PO TABS
12.5000 mg | ORAL_TABLET | Freq: Two times a day (BID) | ORAL | Status: DC
  Administered 2021-06-08: 21:00:00 12.5 mg via ORAL
  Filled 2021-06-08: qty 1

## 2021-06-08 MED ORDER — ACETAMINOPHEN 500 MG PO TABS
1000.0000 mg | ORAL_TABLET | Freq: Once | ORAL | Status: AC
  Administered 2021-06-08: 21:00:00 1000 mg via ORAL
  Filled 2021-06-08: qty 2

## 2021-06-08 MED ORDER — TETANUS-DIPHTH-ACELL PERTUSSIS 5-2.5-18.5 LF-MCG/0.5 IM SUSY
0.5000 mL | PREFILLED_SYRINGE | Freq: Once | INTRAMUSCULAR | Status: AC
  Administered 2021-06-08: 21:00:00 0.5 mL via INTRAMUSCULAR
  Filled 2021-06-08: qty 0.5

## 2021-06-08 NOTE — Telephone Encounter (Signed)
Pt contacted and states he does not have any shortness of breath when laying down and no issues with breathing when sitting still.
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
## 2021-06-08 NOTE — Telephone Encounter (Signed)
Pt contacted and verbalized understanding.  

## 2021-06-08 NOTE — Telephone Encounter (Signed)
I looked at his blood work His hemoglobin and hematocrit actually look respectable They are better than what they have been I do not feel the reason for his fatigue and giving out of energy is due to his hemoglobin. Is more likely related to renal disease  Also need to find out from patient any severe shortness of breath with laying down?  How is his breathing when he is sitting still?

## 2021-06-08 NOTE — Telephone Encounter (Signed)
Pt contacted office and states he saw his cardiologist on 05/31/21 and had lab work done. Pt states his hemoglobin and hematocrit both are low and pt is weak and become exhausted easily. Pt states he has not heard from the cardiologist yet and is trying to figure out what he needs to do. Please advise. Thank you.

## 2021-06-08 NOTE — Telephone Encounter (Signed)
It would be best to follow-up with cardiology regarding additional information It would also be wise for him to keep his follow-up visit on 9 January with me.
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
## 2021-06-09 DIAGNOSIS — T24331A Burn of third degree of right lower leg, initial encounter: Secondary | ICD-10-CM | POA: Diagnosis not present

## 2021-06-09 MED ORDER — SILVER SULFADIAZINE 1 % EX CREA: 1.0000 "application " | TOPICAL_CREAM | Freq: Every day | CUTANEOUS | 1 refills | Status: DC

## 2021-06-09 MED ORDER — SILVER SULFADIAZINE 1 % EX CREA
TOPICAL_CREAM | Freq: Once | CUTANEOUS | Status: AC
  Filled 2021-06-09: qty 85

## 2021-06-09 MED ORDER — HYDROCODONE-ACETAMINOPHEN 5-325 MG PO TABS: 1.0000 | ORAL_TABLET | ORAL | 0 refills | Status: DC | PRN

## 2021-06-09 NOTE — Discharge Instructions (Signed)
Change your dressing once a day.  You may take ibuprofen and/or acetaminophen as needed for less severe pain.  Reserve hydrocodone-acetaminophen for more severe pain.  Return if you are having any problems.
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
## 2021-06-10 ENCOUNTER — Telehealth: Payer: Self-pay

## 2021-06-10 DIAGNOSIS — R5383 Other fatigue: Secondary | ICD-10-CM | POA: Diagnosis not present

## 2021-06-10 DIAGNOSIS — I129 Hypertensive chronic kidney disease with stage 1 through stage 4 chronic kidney disease, or unspecified chronic kidney disease: Secondary | ICD-10-CM | POA: Diagnosis not present

## 2021-06-10 DIAGNOSIS — E1122 Type 2 diabetes mellitus with diabetic chronic kidney disease: Secondary | ICD-10-CM | POA: Diagnosis not present

## 2021-06-10 DIAGNOSIS — T24009A Burn of unspecified degree of unspecified site of unspecified lower limb, except ankle and foot, initial encounter: Secondary | ICD-10-CM | POA: Diagnosis not present

## 2021-06-10 DIAGNOSIS — I5020 Unspecified systolic (congestive) heart failure: Secondary | ICD-10-CM | POA: Diagnosis not present

## 2021-06-10 DIAGNOSIS — N1832 Chronic kidney disease, stage 3b: Secondary | ICD-10-CM | POA: Diagnosis not present
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
## 2021-06-17 NOTE — Progress Notes (Signed)
This encounter was created in error - please disregard.

## 2021-06-21 ENCOUNTER — Ambulatory Visit: Payer: Medicare HMO | Admitting: Family Medicine

## 2021-06-21 DIAGNOSIS — I2510 Atherosclerotic heart disease of native coronary artery without angina pectoris: Secondary | ICD-10-CM | POA: Diagnosis not present

## 2021-06-21 DIAGNOSIS — I509 Heart failure, unspecified: Secondary | ICD-10-CM | POA: Diagnosis not present

## 2021-06-21 DIAGNOSIS — G4730 Sleep apnea, unspecified: Secondary | ICD-10-CM | POA: Diagnosis not present

## 2021-06-21 DIAGNOSIS — K22710 Barrett's esophagus with low grade dysplasia: Secondary | ICD-10-CM | POA: Diagnosis not present

## 2021-06-21 DIAGNOSIS — I110 Hypertensive heart disease with heart failure: Secondary | ICD-10-CM | POA: Diagnosis not present

## 2021-06-21 DIAGNOSIS — E1121 Type 2 diabetes mellitus with diabetic nephropathy: Secondary | ICD-10-CM | POA: Diagnosis not present

## 2021-06-21 DIAGNOSIS — K449 Diaphragmatic hernia without obstruction or gangrene: Secondary | ICD-10-CM | POA: Diagnosis not present

## 2021-06-21 DIAGNOSIS — C159 Malignant neoplasm of esophagus, unspecified: Secondary | ICD-10-CM | POA: Diagnosis not present

## 2021-06-21 DIAGNOSIS — E785 Hyperlipidemia, unspecified: Secondary | ICD-10-CM | POA: Diagnosis not present

## 2021-06-24 ENCOUNTER — Ambulatory Visit (INDEPENDENT_AMBULATORY_CARE_PROVIDER_SITE_OTHER): Payer: Medicare HMO | Admitting: Family Medicine

## 2021-06-24 ENCOUNTER — Other Ambulatory Visit: Payer: Self-pay

## 2021-06-24 ENCOUNTER — Ambulatory Visit (INDEPENDENT_AMBULATORY_CARE_PROVIDER_SITE_OTHER): Payer: Medicare HMO | Admitting: Plastic Surgery

## 2021-06-24 VITALS — Ht 70.0 in | Wt 194.4 lb

## 2021-06-24 DIAGNOSIS — R0602 Shortness of breath: Secondary | ICD-10-CM

## 2021-06-24 DIAGNOSIS — T24331A Burn of third degree of right lower leg, initial encounter: Secondary | ICD-10-CM | POA: Diagnosis not present

## 2021-06-24 DIAGNOSIS — T24239D Burn of second degree of unspecified lower leg, subsequent encounter: Secondary | ICD-10-CM

## 2021-06-24 MED ORDER — ALBUTEROL SULFATE HFA 108 (90 BASE) MCG/ACT IN AERS: 2.0000 | INHALATION_SPRAY | Freq: Four times a day (QID) | RESPIRATORY_TRACT | 5 refills | Status: DC | PRN

## 2021-06-24 MED ORDER — CELECOXIB 200 MG PO CAPS: 200.0000 mg | ORAL_CAPSULE | Freq: Two times a day (BID) | ORAL | 1 refills | Status: DC

## 2021-06-24 NOTE — Patient Instructions (Signed)
1/23 - 830 AM Wound Care [address]  [place], [place], [place] [address]  Appt with Plastics today @ [address] [place], [place], [place] [address]
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
## 2021-06-24 NOTE — Assessment & Plan Note (Addendum)
I called and spoke with the plastic surgery office.  I got the patient an appointment today at 230.  I also spoke with wound care at [facility].  I have scheduled him an appointment on 1/23.  Advised him to keep the wound clean and to remove as much debris and devitalized tissue as he can without significant discomfort.  Advised to keep wound covered and dressed.  Stop use of Silvadene.

## 2021-06-25 ENCOUNTER — Other Ambulatory Visit: Payer: Self-pay

## 2021-06-25 ENCOUNTER — Encounter (HOSPITAL_COMMUNITY): Payer: Self-pay | Admitting: Plastic Surgery
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
## 2021-06-25 NOTE — Anesthesia Preprocedure Evaluation (Deleted)
Anesthesia Evaluation    Airway        Dental   Pulmonary former smoker,           Cardiovascular hypertension,      Neuro/Psych    GI/Hepatic   Endo/Other  diabetes  Renal/GU      Musculoskeletal   Abdominal   Peds  Hematology   Anesthesia Other Findings   Reproductive/Obstetrics                             Anesthesia Physical Anesthesia Plan  ASA:   Anesthesia Plan:    Post-op Pain Management:    Induction:   PONV Risk Score and Plan:   Airway Management Planned:   Additional Equipment:   Intra-op Plan:   Post-operative Plan:   Informed Consent:   Plan Discussed with:   Anesthesia Plan Comments: (See PAT note written 06/25/2021 by Myra Gianotti, PA-C. )        Anesthesia Quick Evaluation

## 2021-06-25 NOTE — Progress Notes (Signed)
Spoke with pt for pre-op call. Pt has an extensive heart hx. Pt's cardiologist is Dr. Oval Linsey. Pt denies any recent chest pain. Pt is a type 2 diabetic. Last A1C was 6.5. Pt takes Ozempic once a week. Instructed pt to check his blood sugar when he gets up and every 2 hours until he leaves for the hospital. If blood sugar is 70 or below, treat with 1/2 cup of clear juice (apple or cranberry) and recheck blood sugar 15 minutes after drinking juice. If blood sugar continues to be 70 or below, call the Short Stay department and ask to speak to a nurse. Pt voiced understanding.   Pt's surgery is scheduled as ambulatory so no Covid test is required prior to surgery.  Chart sent to Anesthesia PA

## 2021-06-28 ENCOUNTER — Ambulatory Visit (HOSPITAL_COMMUNITY)
Admission: RE | Admit: 2021-06-28 | Discharge: 2021-06-28 | Disposition: A | Discharge status: Home / Self Care | Payer: Medicare HMO | Attending: Plastic Surgery | Admitting: Plastic Surgery

## 2021-06-28 ENCOUNTER — Ambulatory Visit (HOSPITAL_COMMUNITY): Payer: Medicare HMO | Admitting: Physician Assistant

## 2021-06-28 ENCOUNTER — Encounter (HOSPITAL_COMMUNITY): Payer: Self-pay | Admitting: Plastic Surgery

## 2021-06-28 ENCOUNTER — Other Ambulatory Visit: Payer: Self-pay | Admitting: Surgical

## 2021-06-28 ENCOUNTER — Other Ambulatory Visit: Payer: Self-pay | Admitting: Physician Assistant

## 2021-06-28 ENCOUNTER — Encounter (HOSPITAL_COMMUNITY)
Admission: RE | Disposition: A | Discharge status: Home / Self Care | Payer: Self-pay | Source: Home / Self Care | Attending: Plastic Surgery

## 2021-06-28 DIAGNOSIS — T24301A Burn of third degree of unspecified site of right lower limb, except ankle and foot, initial encounter: Secondary | ICD-10-CM | POA: Diagnosis not present

## 2021-06-28 DIAGNOSIS — X030XXA Exposure to flames in controlled fire, not in building or structure, initial encounter: Secondary | ICD-10-CM | POA: Insufficient documentation

## 2021-06-28 DIAGNOSIS — M1990 Unspecified osteoarthritis, unspecified site: Secondary | ICD-10-CM | POA: Insufficient documentation

## 2021-06-28 DIAGNOSIS — E1151 Type 2 diabetes mellitus with diabetic peripheral angiopathy without gangrene: Secondary | ICD-10-CM | POA: Diagnosis not present

## 2021-06-28 DIAGNOSIS — G4730 Sleep apnea, unspecified: Secondary | ICD-10-CM | POA: Diagnosis not present

## 2021-06-28 DIAGNOSIS — T310 Burns involving less than 10% of body surface: Secondary | ICD-10-CM | POA: Insufficient documentation

## 2021-06-28 DIAGNOSIS — I509 Heart failure, unspecified: Secondary | ICD-10-CM | POA: Insufficient documentation

## 2021-06-28 DIAGNOSIS — K219 Gastro-esophageal reflux disease without esophagitis: Secondary | ICD-10-CM | POA: Diagnosis not present

## 2021-06-28 DIAGNOSIS — Z951 Presence of aortocoronary bypass graft: Secondary | ICD-10-CM | POA: Insufficient documentation

## 2021-06-28 DIAGNOSIS — T24331A Burn of third degree of right lower leg, initial encounter: Secondary | ICD-10-CM | POA: Diagnosis not present

## 2021-06-28 DIAGNOSIS — I2510 Atherosclerotic heart disease of native coronary artery without angina pectoris: Secondary | ICD-10-CM | POA: Diagnosis not present

## 2021-06-28 DIAGNOSIS — F32A Depression, unspecified: Secondary | ICD-10-CM | POA: Insufficient documentation

## 2021-06-28 DIAGNOSIS — I110 Hypertensive heart disease with heart failure: Secondary | ICD-10-CM | POA: Insufficient documentation

## 2021-06-28 DIAGNOSIS — Z87891 Personal history of nicotine dependence: Secondary | ICD-10-CM | POA: Insufficient documentation

## 2021-06-28 HISTORY — DX: Anemia, unspecified: D64.9

## 2021-06-28 HISTORY — PX: INCISION AND DRAINAGE OF WOUND: SHX1803

## 2021-06-28 HISTORY — DX: Malignant (primary) neoplasm, unspecified: C80.1

## 2021-06-28 HISTORY — PX: SKIN SPLIT GRAFT: SHX444

## 2021-06-28 LAB — GLUCOSE, CAPILLARY
Glucose-Capillary: 106 mg/dL — ABNORMAL HIGH (ref 70–99)
Glucose-Capillary: 95 mg/dL (ref 70–99)
Glucose-Capillary: 105 mg/dL — ABNORMAL HIGH (ref 70–99)

## 2021-06-28 SURGERY — IRRIGATION AND DEBRIDEMENT WOUND
Anesthesia: General | Site: Leg Lower | Laterality: Right

## 2021-06-28 MED ORDER — HYDROCODONE-ACETAMINOPHEN 5-325 MG PO TABS: 1.0000 | ORAL_TABLET | Freq: Four times a day (QID) | ORAL | 0 refills | Status: AC | PRN

## 2021-06-28 MED ORDER — SODIUM CHLORIDE 0.9 % IV SOLN: INTRAVENOUS | Status: DC

## 2021-06-28 MED ORDER — MIDAZOLAM HCL 2 MG/2ML IJ SOLN
INTRAMUSCULAR | Status: AC
  Filled 2021-06-28: qty 2

## 2021-06-28 MED ORDER — CELECOXIB 200 MG PO CAPS
ORAL_CAPSULE | ORAL | Status: AC
  Filled 2021-06-28: qty 1

## 2021-06-28 MED ORDER — EPINEPHRINE PF 1 MG/ML IJ SOLN
INTRAMUSCULAR | Status: AC
  Filled 2021-06-28: qty 1

## 2021-06-28 MED ORDER — CEFAZOLIN SODIUM-DEXTROSE 2-4 GM/100ML-% IV SOLN
2.0000 g | INTRAVENOUS | Status: AC
  Administered 2021-06-28: 2 g via INTRAVENOUS
  Filled 2021-06-28: qty 100

## 2021-06-28 MED ORDER — LACTATED RINGERS IV SOLN
INTRAVENOUS | Status: DC | PRN
  Administered 2021-06-28: 1031 mL

## 2021-06-28 MED ORDER — FENTANYL CITRATE (PF) 250 MCG/5ML IJ SOLN
INTRAMUSCULAR | Status: AC
  Filled 2021-06-28: qty 5

## 2021-06-28 MED ORDER — ACETAMINOPHEN 500 MG PO TABS
1000.0000 mg | ORAL_TABLET | Freq: Once | ORAL | Status: AC
  Administered 2021-06-28: 1000 mg via ORAL

## 2021-06-28 MED ORDER — LIDOCAINE 2% (20 MG/ML) 5 ML SYRINGE
INTRAMUSCULAR | Status: AC
  Filled 2021-06-28: qty 5

## 2021-06-28 MED ORDER — AMISULPRIDE (ANTIEMETIC) 5 MG/2ML IV SOLN: 10.0000 mg | Freq: Once | INTRAVENOUS | Status: DC | PRN

## 2021-06-28 MED ORDER — CHLORHEXIDINE GLUCONATE 0.12 % MT SOLN
15.0000 mL | Freq: Once | OROMUCOSAL | Status: AC
  Administered 2021-06-28: 15 mL via OROMUCOSAL
  Filled 2021-06-28: qty 15

## 2021-06-28 MED ORDER — LIDOCAINE 2% (20 MG/ML) 5 ML SYRINGE
INTRAMUSCULAR | Status: DC | PRN
Start: 2021-06-28 — End: 2021-06-28
  Administered 2021-06-28: 100 mg via INTRAVENOUS

## 2021-06-28 MED ORDER — HYDROCODONE-ACETAMINOPHEN 5-325 MG PO TABS: 1.0000 | ORAL_TABLET | Freq: Four times a day (QID) | ORAL | 0 refills | Status: DC | PRN

## 2021-06-28 MED ORDER — LACTATED RINGERS IV SOLN: INTRAVENOUS | Status: DC | PRN

## 2021-06-28 MED ORDER — 0.9 % SODIUM CHLORIDE (POUR BTL) OPTIME
TOPICAL | Status: DC | PRN
  Administered 2021-06-28: 1000 mL

## 2021-06-28 MED ORDER — ORAL CARE MOUTH RINSE: 15.0000 mL | Freq: Once | OROMUCOSAL | Status: AC

## 2021-06-28 MED ORDER — BUPIVACAINE HCL (PF) 0.25 % IJ SOLN
INTRAMUSCULAR | Status: AC
  Filled 2021-06-28: qty 30

## 2021-06-28 MED ORDER — ONDANSETRON HCL 4 MG/2ML IJ SOLN
INTRAMUSCULAR | Status: DC | PRN
  Administered 2021-06-28: 4 mg via INTRAVENOUS

## 2021-06-28 MED ORDER — EPHEDRINE 5 MG/ML INJ
INTRAVENOUS | Status: AC
  Filled 2021-06-28: qty 5

## 2021-06-28 MED ORDER — FENTANYL CITRATE (PF) 250 MCG/5ML IJ SOLN
INTRAMUSCULAR | Status: DC | PRN
  Administered 2021-06-28: 50 ug via INTRAVENOUS
  Administered 2021-06-28: 25 ug via INTRAVENOUS
  Administered 2021-06-28: 100 ug via INTRAVENOUS

## 2021-06-28 MED ORDER — ONDANSETRON HCL 4 MG/2ML IJ SOLN
INTRAMUSCULAR | Status: AC
  Filled 2021-06-28: qty 2

## 2021-06-28 MED ORDER — PHENYLEPHRINE HCL-NACL 20-0.9 MG/250ML-% IV SOLN
INTRAVENOUS | Status: DC | PRN
  Administered 2021-06-28: 50 ug/min via INTRAVENOUS

## 2021-06-28 MED ORDER — MINERAL OIL LIGHT 100 % EX OIL
TOPICAL_OIL | CUTANEOUS | Status: DC | PRN
  Administered 2021-06-28: 1 via TOPICAL

## 2021-06-28 MED ORDER — PHENYLEPHRINE 40 MCG/ML (10ML) SYRINGE FOR IV PUSH (FOR BLOOD PRESSURE SUPPORT)
PREFILLED_SYRINGE | INTRAVENOUS | Status: AC
  Filled 2021-06-28: qty 10

## 2021-06-28 MED ORDER — PROMETHAZINE HCL 25 MG/ML IJ SOLN: 6.2500 mg | INTRAMUSCULAR | Status: DC | PRN

## 2021-06-28 MED ORDER — PROPOFOL 10 MG/ML IV BOLUS
INTRAVENOUS | Status: DC | PRN
  Administered 2021-06-28: 40 mg via INTRAVENOUS
  Administered 2021-06-28: 100 mg via INTRAVENOUS

## 2021-06-28 MED ORDER — CELECOXIB 200 MG PO CAPS
200.0000 mg | ORAL_CAPSULE | Freq: Once | ORAL | Status: AC
  Administered 2021-06-28: 200 mg via ORAL

## 2021-06-28 MED ORDER — EPHEDRINE SULFATE-NACL 50-0.9 MG/10ML-% IV SOSY
PREFILLED_SYRINGE | INTRAVENOUS | Status: DC | PRN
Start: 2021-06-28 — End: 2021-06-28
  Administered 2021-06-28: 10 mg via INTRAVENOUS

## 2021-06-28 MED ORDER — MIDAZOLAM HCL 2 MG/2ML IJ SOLN
INTRAMUSCULAR | Status: DC | PRN
  Administered 2021-06-28: .5 mg via INTRAVENOUS

## 2021-06-28 MED ORDER — FENTANYL CITRATE (PF) 100 MCG/2ML IJ SOLN
INTRAMUSCULAR | Status: AC
  Filled 2021-06-28: qty 2

## 2021-06-28 MED ORDER — SODIUM CHLORIDE 0.9 % IV SOLN
INTRAVENOUS | Status: DC | PRN
Start: 2021-06-28 — End: 2021-06-28

## 2021-06-28 MED ORDER — FENTANYL CITRATE (PF) 100 MCG/2ML IJ SOLN
25.0000 ug | INTRAMUSCULAR | Status: DC | PRN
  Administered 2021-06-28 (×2): 50 ug via INTRAVENOUS

## 2021-06-28 MED ORDER — ACETAMINOPHEN 500 MG PO TABS
ORAL_TABLET | ORAL | Status: AC
  Filled 2021-06-28: qty 2

## 2021-06-28 MED ORDER — LABETALOL HCL 5 MG/ML IV SOLN
5.0000 mg | Freq: Once | INTRAVENOUS | Status: AC
  Administered 2021-06-28: 5 mg via INTRAVENOUS
  Filled 2021-06-28: qty 4

## 2021-06-28 SURGICAL SUPPLY — 69 items
BAG COUNTER SPONGE SURGICOUNT (BAG) ×2 IMPLANT
BAG DECANTER FOR FLEXI CONT (MISCELLANEOUS) ×2 IMPLANT
BLADE CLIPPER SURG (BLADE) ×1 IMPLANT
BLADE DERMATOME SS (BLADE) ×2 IMPLANT
BLADE SURG 15 STRL LF DISP TIS (BLADE) ×1 IMPLANT
BLADE SURG 15 STRL SS (BLADE) ×2
BNDG ELASTIC 4X5.8 VLCR STR LF (GAUZE/BANDAGES/DRESSINGS) IMPLANT
BNDG ELASTIC 6X5.8 VLCR STR LF (GAUZE/BANDAGES/DRESSINGS) ×3 IMPLANT
BNDG GAUZE ELAST 4 BULKY (GAUZE/BANDAGES/DRESSINGS) ×2 IMPLANT
BRUSH SCRUB EZ PLAIN DRY (MISCELLANEOUS) ×13 IMPLANT
CANISTER SUCT 1200ML W/VALVE (MISCELLANEOUS) ×2 IMPLANT
CANISTER SUCT 3000ML PPV (MISCELLANEOUS) IMPLANT
CANISTER WOUND CARE 500ML ATS (WOUND CARE) IMPLANT
CHLORAPREP W/TINT 26 (MISCELLANEOUS) IMPLANT
COVER SURGICAL LIGHT HANDLE (MISCELLANEOUS) ×2 IMPLANT
DERMACARRIERS GRAFT 1 TO 1.5 (DISPOSABLE) ×2
DRAPE EXTREMITY T 121X128X90 (DISPOSABLE) IMPLANT
DRAPE HALF SHEET 40X57 (DRAPES) ×2 IMPLANT
DRAPE INCISE IOBAN 66X45 STRL (DRAPES) ×1 IMPLANT
DRAPE LAPAROTOMY T 98X78 PEDS (DRAPES) IMPLANT
DRAPE ORTHO SPLIT 77X108 STRL (DRAPES) ×4
DRAPE SURG ORHT 6 SPLT 77X108 (DRAPES) ×2 IMPLANT
DRSG CALCIUM ALGINATE 4X4 (GAUZE/BANDAGES/DRESSINGS) IMPLANT
DRSG EMULSION OIL 3X3 NADH (GAUZE/BANDAGES/DRESSINGS) IMPLANT
DRSG MEPITEL 4X7.2 (GAUZE/BANDAGES/DRESSINGS) IMPLANT
DRSG OPSITE 6X11 MED (GAUZE/BANDAGES/DRESSINGS) IMPLANT
DRSG PAD ABDOMINAL 8X10 ST (GAUZE/BANDAGES/DRESSINGS) ×1 IMPLANT
DRSG TELFA 3X8 NADH (GAUZE/BANDAGES/DRESSINGS) ×10 IMPLANT
DRSG VAC ATS LRG SENSATRAC (GAUZE/BANDAGES/DRESSINGS) IMPLANT
DRSG VAC ATS MED SENSATRAC (GAUZE/BANDAGES/DRESSINGS) IMPLANT
DRSG VAC ATS SM SENSATRAC (GAUZE/BANDAGES/DRESSINGS) IMPLANT
ELECT CAUTERY BLADE 6.4 (BLADE) ×2 IMPLANT
ELECT REM PT RETURN 9FT ADLT (ELECTROSURGICAL) ×2
ELECTRODE REM PT RTRN 9FT ADLT (ELECTROSURGICAL) ×1 IMPLANT
FILTER STRAW FLUID ASPIR (MISCELLANEOUS) ×1 IMPLANT
GAUZE SPONGE 4X4 12PLY STRL (GAUZE/BANDAGES/DRESSINGS) ×3 IMPLANT
GAUZE XEROFORM 5X9 LF (GAUZE/BANDAGES/DRESSINGS) ×6 IMPLANT
GLOVE SRG 8 PF TXTR STRL LF DI (GLOVE) ×1 IMPLANT
GLOVE SURG ENC TEXT LTX SZ7.5 (GLOVE) ×4 IMPLANT
GLOVE SURG UNDER LTX SZ8 (GLOVE) ×4 IMPLANT
GLOVE SURG UNDER POLY LF SZ8 (GLOVE) ×2
GOWN STRL REUS W/ TWL LRG LVL3 (GOWN DISPOSABLE) ×2 IMPLANT
GOWN STRL REUS W/TWL LRG LVL3 (GOWN DISPOSABLE) ×4
GRAFT DERMACARRIERS 1 TO 1.5 (DISPOSABLE) ×1 IMPLANT
HANDPIECE INTERPULSE COAX TIP (DISPOSABLE)
IV NS 1000ML (IV SOLUTION) ×2
IV NS 1000ML BAXH (IV SOLUTION) ×1 IMPLANT
KIT BASIN OR (CUSTOM PROCEDURE TRAY) ×2 IMPLANT
KIT TURNOVER KIT A (KITS) ×2 IMPLANT
KIT TURNOVER KIT B (KITS) ×2 IMPLANT
MANIFOLD NEPTUNE II (INSTRUMENTS) ×2 IMPLANT
NDL SPNL 18GX3.5 QUINCKE PK (NEEDLE) ×2 IMPLANT
NEEDLE SPNL 18GX3.5 QUINCKE PK (NEEDLE) ×2 IMPLANT
NS IRRIG 1000ML POUR BTL (IV SOLUTION) ×2 IMPLANT
PACK GENERAL/GYN (CUSTOM PROCEDURE TRAY) ×2 IMPLANT
PAD ARMBOARD 7.5X6 YLW CONV (MISCELLANEOUS) ×4 IMPLANT
PAD DRESSING TELFA 3X8 NADH (GAUZE/BANDAGES/DRESSINGS) IMPLANT
SET HNDPC FAN SPRY TIP SCT (DISPOSABLE) IMPLANT
SOLUTION BETADINE 4OZ (MISCELLANEOUS) ×2 IMPLANT
STAPLER VISISTAT 35W (STAPLE) ×4 IMPLANT
SUT CHROMIC 4 0 PS 2 18 (SUTURE) IMPLANT
SUT PDS AB 3-0 SH 27 (SUTURE) IMPLANT
SWAB COLLECTION DEVICE MRSA (MISCELLANEOUS) IMPLANT
SYR 50ML LL SCALE MARK (SYRINGE) ×3 IMPLANT
SYR CONTROL 10ML LL (SYRINGE) ×1 IMPLANT
TOWEL GREEN STERILE (TOWEL DISPOSABLE) ×1 IMPLANT
TOWEL GREEN STERILE FF (TOWEL DISPOSABLE) ×2 IMPLANT
TUBING INFILTRATION IT-10001 (TUBING) ×1 IMPLANT
UNDERPAD 30X36 HEAVY ABSORB (UNDERPADS AND DIAPERS) ×2 IMPLANT
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
## 2021-06-28 NOTE — Anesthesia Procedure Notes (Signed)
Procedure Name: LMA Insertion Date/Time: 06/28/2021 3:50 PM Performed by: Cathren Harsh, CRNA Pre-anesthesia Checklist: Patient identified, Emergency Drugs available, Suction available and Patient being monitored Patient Re-evaluated:Patient Re-evaluated prior to induction Oxygen Delivery Method: Circle System Utilized Preoxygenation: Pre-oxygenation with 100% oxygen Induction Type: IV induction Ventilation: Mask ventilation without difficulty LMA: LMA with gastric port inserted LMA Size: 5.0 Number of attempts: 1 Airway Equipment and Method: Bite block Placement Confirmation: positive ETCO2 Tube secured with: Tape Dental Injury: Teeth and Oropharynx as per pre-operative assessment

## 2021-06-28 NOTE — Anesthesia Preprocedure Evaluation (Addendum)
Anesthesia Evaluation  Patient identified by MRN, date of birth, ID band Patient awake    Reviewed: Allergy & Precautions, NPO status , Patient's Chart, lab work & pertinent test results  History of Anesthesia Complications Negative for: history of anesthetic complications  Airway Mallampati: II  TM Distance: >3 FB Neck ROM: Full    Dental no notable dental hx. (+) Dental Advisory Given   Pulmonary sleep apnea and Continuous Positive Airway Pressure Ventilation , former smoker,    Pulmonary exam normal        Cardiovascular hypertension, + CAD, + CABG, + Peripheral Vascular Disease and +CHF  Normal cardiovascular exam  Last cardiology follow-up with Dr. Oval Linsey was on 05/31/21. He was without anginal symptoms. Last Lexiscan Myoview 02/12/21 showed with prior infarct with mild peri-infarct ischemia, EF 28% (45-50% 02/04/21 echo).  Given lack of symptoms and CKD, medical management of CAD recommended. He   Nuclear stress test 02/12/21: Findings are consistent with prior myocardial infarction with peri-infarct ischemia. The study is high risk.  No ST deviation was noted.  LV perfusion is abnormal. There is evidence of ischemia. There is evidence of infarction. Defect 1: There is a medium defect with moderate reduction in uptake present in the mid to basal inferior and inferolateral location(s) that is fixed. There is abnormal wall motion in the defect area. Consistent with infarction and peri-infarct ischemia.  Left ventricular function is abnormal. Global function is severely reduced. There was a single regional abnormality. Nuclear stress EF: 28 %. The left ventricular ejection fraction is severely decreased (<30%). End diastolic cavity size is moderately enlarged. End systolic cavity size is moderately enlarged.  Prior study available for comparison from 09/25/2019. There are changes compared to prior study. The left ventricular  ejection fraction has decreased. LVEF 35%, moderate size and intensity fixed inferior defect suggestive of scar with mild peri-infarct ischemia  Moderate size and intensity, mostly fixed basal to mid inferior and inferolateral perfusion defect (SDS 1), suggestive of scar with minimal peri-infarct ischemia. LVEF 28% with global hypokinesis and basal to mid inferior akinesis. This is a high risk study. In comparison with a prior study in 2021, the LVEF is lower (was previously 35%), however, there are no new reversible perfusion defects.   Neuro/Psych PSYCHIATRIC DISORDERS Depression negative neurological ROS     GI/Hepatic Neg liver ROS, GERD  ,  Endo/Other  diabetes, Type 2  Renal/GU Renal disease     Musculoskeletal  (+) Arthritis ,   Abdominal Normal abdominal exam  (+)   Peds  Hematology   Anesthesia Other Findings   Reproductive/Obstetrics                            Anesthesia Physical  Anesthesia Plan  ASA: 3  Anesthesia Plan: General   Post-op Pain Management: Celebrex PO (pre-op) and Tylenol PO (pre-op)   Induction: Intravenous  PONV Risk Score and Plan: 3 and Ondansetron, Dexamethasone and Midazolam  Airway Management Planned: LMA  Additional Equipment: None  Intra-op Plan:   Post-operative Plan: Extubation in OR  Informed Consent: I have reviewed the patients History and Physical, chart, labs and discussed the procedure including the risks, benefits and alternatives for the proposed anesthesia with the patient or authorized representative who has indicated his/her understanding and acceptance.     Dental advisory given  Plan Discussed with: Anesthesiologist and CRNA  Anesthesia Plan Comments:        Anesthesia Quick Evaluation

## 2021-06-28 NOTE — Discharge Instructions (Signed)
Activity: As tolerated, but avoid strenuous activity until follow up visit.  Diet: Regular  Wound Care: Please keep the bolster on the graft site in place.  It will be removed at your post-op visit.  Keep it wrapped with Kerlix and Ace. Please keep it dry.    Special Instructions:  Call our office if any unusual problems occur such as pain, excessive bleeding, unrelieved nausea/vomiting, fever &/or chills.  Follow-up appointment: Scheduled for next week.

## 2021-06-28 NOTE — Transfer of Care (Signed)
Immediate Anesthesia Transfer of Care Note  Patient: [name]  Procedure(s) Performed: IRRIGATION AND DEBRIDEMENT WOUND (Right: Leg Lower) SKIN GRAFT SPLIT THICKNESS (Right: Leg Lower)  Patient Location: PACU  Anesthesia Type:General  Level of Consciousness: drowsy and patient cooperative  Airway & Oxygen Therapy: Patient Spontanous Breathing and Patient connected to face mask oxygen  Post-op Assessment: Report given to RN and Post -op Vital signs reviewed and stable  Post vital signs: Reviewed and stable  Last Vitals:  Vitals Value Taken Time  BP 169/95 06/28/21 1727  Temp    Pulse 93 06/28/21 1729  Resp 14 06/28/21 1729  SpO2 100 % 06/28/21 1729  Vitals shown include unvalidated device data.  Last Pain:  Vitals:   06/28/21 1217  TempSrc: Oral      Patients Stated Pain Goal: 3 (12/31/80 8833)  Complications: No notable events documented.

## 2021-06-28 NOTE — Anesthesia Postprocedure Evaluation (Signed)
Anesthesia Post Note  Patient: [name]  Procedure(s) Performed: IRRIGATION AND DEBRIDEMENT WOUND (Right: Leg Lower) SKIN GRAFT SPLIT THICKNESS (Right: Leg Lower)     Patient location during evaluation: PACU Anesthesia Type: General Level of consciousness: awake and alert Pain management: pain level controlled Vital Signs Assessment: post-procedure vital signs reviewed and stable Respiratory status: spontaneous breathing, nonlabored ventilation, respiratory function stable and patient connected to nasal cannula oxygen Cardiovascular status: blood pressure returned to baseline and stable Postop Assessment: no apparent nausea or vomiting Anesthetic complications: no   No notable events documented.  Last Vitals:  Vitals:   06/28/21 1800 06/28/21 1815  BP: (!) 166/89 (!) 167/87  Pulse: 86 86  Resp: (!) 21 20  Temp: 37 C 37 C  SpO2: 95% 96%    Last Pain:  Vitals:   06/28/21 1800  TempSrc:   PainSc: [place]

## 2021-06-29 ENCOUNTER — Encounter (HOSPITAL_COMMUNITY): Payer: Self-pay | Admitting: Plastic Surgery

## 2021-07-05 ENCOUNTER — Ambulatory Visit: Payer: Medicare HMO | Admitting: Physician Assistant

## 2021-07-05 ENCOUNTER — Telehealth: Payer: Self-pay

## 2021-07-05 ENCOUNTER — Ambulatory Visit: Payer: Medicare HMO | Admitting: Psychologist

## 2021-07-05 NOTE — Telephone Encounter (Signed)
06/25/2021-[facility]-Bacitracin is a product that we do not offer as a part of our product catalog. It maybe considered a pharmaceutical or over the counter product. [facility] has shipped all other items at this time. Pt is aware.

## 2021-07-06 ENCOUNTER — Other Ambulatory Visit: Payer: Self-pay

## 2021-07-06 ENCOUNTER — Ambulatory Visit (INDEPENDENT_AMBULATORY_CARE_PROVIDER_SITE_OTHER): Payer: Medicare HMO | Admitting: Plastic Surgery

## 2021-07-06 DIAGNOSIS — T24331A Burn of third degree of right lower leg, initial encounter: Secondary | ICD-10-CM
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
## 2021-07-07 ENCOUNTER — Telehealth: Payer: Self-pay

## 2021-07-07 ENCOUNTER — Ambulatory Visit (INDEPENDENT_AMBULATORY_CARE_PROVIDER_SITE_OTHER): Payer: Medicare HMO | Admitting: Neurology

## 2021-07-07 ENCOUNTER — Encounter: Payer: Self-pay | Admitting: Neurology

## 2021-07-07 VITALS — BP 195/104 | HR 92 | Ht 70.0 in | Wt 210.8 lb

## 2021-07-07 DIAGNOSIS — Z8673 Personal history of transient ischemic attack (TIA), and cerebral infarction without residual deficits: Secondary | ICD-10-CM | POA: Diagnosis not present

## 2021-07-07 DIAGNOSIS — Z789 Other specified health status: Secondary | ICD-10-CM | POA: Diagnosis not present

## 2021-07-07 DIAGNOSIS — R935 Abnormal findings on diagnostic imaging of other abdominal regions, including retroperitoneum: Secondary | ICD-10-CM | POA: Diagnosis not present

## 2021-07-07 DIAGNOSIS — G4733 Obstructive sleep apnea (adult) (pediatric): Secondary | ICD-10-CM | POA: Diagnosis not present

## 2021-07-07 DIAGNOSIS — C159 Malignant neoplasm of esophagus, unspecified: Secondary | ICD-10-CM | POA: Diagnosis not present

## 2021-07-07 MED ORDER — TRAMADOL HCL 50 MG PO TABS: 50.0000 mg | ORAL_TABLET | Freq: Three times a day (TID) | ORAL | 0 refills | Status: AC | PRN
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
## 2021-07-07 NOTE — Telephone Encounter (Signed)
Spoke to Dr. Haynes Bast 50 mg was sent to [facility] this afternoon. Called patient and made him aware. He conveyed understanding and will call pharmacy.

## 2021-07-07 NOTE — Telephone Encounter (Signed)
[name] called for the patient to let us know that the medication we prescribed to him for pain is making him light-headed.  She said that Tylenol is not working for him and asked if we can call in another medication for his pain.  She said that Tramadol has worked for him in the past.  Please call.  *Patient's preferred pharmacy is [facility] in [place].

## 2021-07-07 NOTE — Progress Notes (Deleted)
Subjective:    Patient ID: [name] is a 67 y.o. male.  HPI {Common ambulatory SmartLinks:[id number]}  Review of Systems  Objective:  Neurological Exam  Physical Exam  Assessment:   ***  Plan:   ***

## 2021-07-07 NOTE — Patient Instructions (Signed)
It was nice to see you both again today.  I am sorry to hear that you have had stress, you clearly have a lot going on right now.  Please do your best to maintain good nutrition, eat in smaller increments and talk to your GI specialist about your appetite loss.  Sometimes they can consider medication to boost her appetite.  Try to get back to using her ASV regularly.  It may help to prop up your head of bed a little bit more.  Please continue using your ASV regularly. While your insurance requires that you use the machine at least 4 hours each night on 70% of the nights, I recommend, that you not skip any nights and use it throughout the night if you can. Getting used to using a PAP (positive airway pressure) machine and staying with the treatment long term does take time and patience and discipline. Untreated obstructive sleep apnea when it is moderate to severe can have an adverse impact on cardiovascular health and raise her risk for heart disease, arrhythmias, hypertension, congestive heart failure, stroke and diabetes. Untreated obstructive sleep apnea causes sleep disruption, nonrestorative sleep, and sleep deprivation. This can have an impact on your day to day functioning and cause daytime sleepiness and impairment of cognitive function, memory loss, mood disturbance, and problems focussing. Using a PAP machine regularly can improve these symptoms.  Please follow-up to see one of our nurse practitioners in 3 to 4 months.

## 2021-07-07 NOTE — Addendum Note (Signed)
Addended by: [name] on: 07/07/2021 01:37 PM   Modules accepted: Orders

## 2021-07-08 ENCOUNTER — Other Ambulatory Visit: Payer: Self-pay | Admitting: Physician Assistant

## 2021-07-08 ENCOUNTER — Other Ambulatory Visit (HOSPITAL_COMMUNITY): Payer: Self-pay | Admitting: Gastroenterology

## 2021-07-08 ENCOUNTER — Ambulatory Visit: Payer: Medicare HMO | Admitting: Psychologist

## 2021-07-08 DIAGNOSIS — C159 Malignant neoplasm of esophagus, unspecified: Secondary | ICD-10-CM

## 2021-07-08 DIAGNOSIS — K22719 Barrett's esophagus with dysplasia, unspecified: Secondary | ICD-10-CM

## 2021-07-14 ENCOUNTER — Ambulatory Visit (HOSPITAL_BASED_OUTPATIENT_CLINIC_OR_DEPARTMENT_OTHER): Payer: Medicaid Other | Admitting: Cardiovascular Disease

## 2021-07-14 ENCOUNTER — Encounter (HOSPITAL_BASED_OUTPATIENT_CLINIC_OR_DEPARTMENT_OTHER): Payer: Medicaid Other | Admitting: Physician Assistant

## 2021-07-14 ENCOUNTER — Ambulatory Visit: Payer: Medicare HMO | Admitting: Family Medicine

## 2021-07-15 ENCOUNTER — Other Ambulatory Visit: Payer: Self-pay

## 2021-07-15 ENCOUNTER — Encounter (HOSPITAL_COMMUNITY)
Admission: RE | Admit: 2021-07-15 | Discharge: 2021-07-15 | Disposition: A | Discharge status: Home / Self Care | Payer: Medicare HMO | Source: Ambulatory Visit | Attending: Gastroenterology | Admitting: Gastroenterology

## 2021-07-15 ENCOUNTER — Other Ambulatory Visit (INDEPENDENT_AMBULATORY_CARE_PROVIDER_SITE_OTHER): Payer: Self-pay | Admitting: Internal Medicine

## 2021-07-15 DIAGNOSIS — C159 Malignant neoplasm of esophagus, unspecified: Secondary | ICD-10-CM | POA: Diagnosis not present

## 2021-07-15 DIAGNOSIS — S2241XA Multiple fractures of ribs, right side, initial encounter for closed fracture: Secondary | ICD-10-CM | POA: Diagnosis not present

## 2021-07-15 DIAGNOSIS — K22719 Barrett's esophagus with dysplasia, unspecified: Secondary | ICD-10-CM | POA: Diagnosis not present

## 2021-07-15 DIAGNOSIS — I2510 Atherosclerotic heart disease of native coronary artery without angina pectoris: Secondary | ICD-10-CM | POA: Diagnosis not present

## 2021-07-15 DIAGNOSIS — J90 Pleural effusion, not elsewhere classified: Secondary | ICD-10-CM | POA: Diagnosis not present

## 2021-07-15 MED ORDER — FLUDEOXYGLUCOSE F - 18 (FDG) INJECTION
10.9050 | Freq: Once | INTRAVENOUS | Status: AC
  Administered 2021-07-15: 10.905 via INTRAVENOUS

## 2021-07-15 MED ORDER — METOCLOPRAMIDE HCL 10 MG PO TABS: 10.0000 mg | ORAL_TABLET | Freq: Three times a day (TID) | ORAL | 0 refills | Status: DC

## 2021-07-15 MED ORDER — ONDANSETRON 4 MG PO FILM: 4.0000 mg | ORAL_FILM | Freq: Three times a day (TID) | ORAL | 0 refills | Status: DC | PRN

## 2021-07-15 NOTE — Progress Notes (Signed)
Patient called in stating for the last few days he has been having nausea and vomiting.  He has not experienced hematemesis or melena.  He is not having any fever. He remains on pantoprazole 40 mg p.o. twice daily.  His last EGD with cryotherapy for Barrett's with high-grade dysplasia and adenocarcinoma was 3 weeks ago. We will treat patient with ondansetron 4 mg 3 times daily as needed and metoclopramide 10 mg to 3 times a day.  Patient was informed of potential side effects such as tremors of nervousness.  If he has side effects he will stop the medication.  Patient advised to call office with progress report next week.

## 2021-07-20 ENCOUNTER — Emergency Department (HOSPITAL_COMMUNITY)
Admission: EM | Admit: 2021-07-20 | Discharge: 2021-07-21 | Disposition: A | Discharge status: Home / Self Care | Payer: Medicare HMO | Attending: Emergency Medicine | Admitting: Emergency Medicine

## 2021-07-20 ENCOUNTER — Emergency Department (HOSPITAL_COMMUNITY): Payer: Medicare HMO

## 2021-07-20 ENCOUNTER — Other Ambulatory Visit: Payer: Self-pay

## 2021-07-20 DIAGNOSIS — Z8501 Personal history of malignant neoplasm of esophagus: Secondary | ICD-10-CM | POA: Insufficient documentation

## 2021-07-20 DIAGNOSIS — I5042 Chronic combined systolic (congestive) and diastolic (congestive) heart failure: Secondary | ICD-10-CM | POA: Insufficient documentation

## 2021-07-20 DIAGNOSIS — I2510 Atherosclerotic heart disease of native coronary artery without angina pectoris: Secondary | ICD-10-CM | POA: Insufficient documentation

## 2021-07-20 DIAGNOSIS — E1122 Type 2 diabetes mellitus with diabetic chronic kidney disease: Secondary | ICD-10-CM | POA: Diagnosis not present

## 2021-07-20 DIAGNOSIS — Z79899 Other long term (current) drug therapy: Secondary | ICD-10-CM | POA: Insufficient documentation

## 2021-07-20 DIAGNOSIS — R209 Unspecified disturbances of skin sensation: Secondary | ICD-10-CM | POA: Insufficient documentation

## 2021-07-20 DIAGNOSIS — N189 Chronic kidney disease, unspecified: Secondary | ICD-10-CM | POA: Insufficient documentation

## 2021-07-20 DIAGNOSIS — I130 Hypertensive heart and chronic kidney disease with heart failure and stage 1 through stage 4 chronic kidney disease, or unspecified chronic kidney disease: Secondary | ICD-10-CM | POA: Insufficient documentation

## 2021-07-20 DIAGNOSIS — I150 Renovascular hypertension: Secondary | ICD-10-CM | POA: Insufficient documentation

## 2021-07-20 DIAGNOSIS — R079 Chest pain, unspecified: Secondary | ICD-10-CM | POA: Diagnosis not present

## 2021-07-20 DIAGNOSIS — I10 Essential (primary) hypertension: Secondary | ICD-10-CM | POA: Diagnosis not present

## 2021-07-20 DIAGNOSIS — J9811 Atelectasis: Secondary | ICD-10-CM | POA: Diagnosis not present

## 2021-07-20 DIAGNOSIS — R778 Other specified abnormalities of plasma proteins: Secondary | ICD-10-CM | POA: Diagnosis not present

## 2021-07-20 DIAGNOSIS — Z7982 Long term (current) use of aspirin: Secondary | ICD-10-CM | POA: Insufficient documentation

## 2021-07-20 DIAGNOSIS — J90 Pleural effusion, not elsewhere classified: Secondary | ICD-10-CM | POA: Diagnosis not present

## 2021-07-20 DIAGNOSIS — R0602 Shortness of breath: Secondary | ICD-10-CM | POA: Diagnosis not present

## 2021-07-20 DIAGNOSIS — Z794 Long term (current) use of insulin: Secondary | ICD-10-CM | POA: Diagnosis not present

## 2021-07-20 DIAGNOSIS — R0789 Other chest pain: Secondary | ICD-10-CM | POA: Diagnosis present

## 2021-07-20 DIAGNOSIS — Z951 Presence of aortocoronary bypass graft: Secondary | ICD-10-CM | POA: Insufficient documentation

## 2021-07-20 DIAGNOSIS — R978 Other abnormal tumor markers: Secondary | ICD-10-CM | POA: Diagnosis not present

## 2021-07-20 LAB — COMPREHENSIVE METABOLIC PANEL
ALT: 17 U/L (ref 0–44)
AST: 15 U/L (ref 15–41)
Albumin: 3.3 g/dL — ABNORMAL LOW (ref 3.5–5.0)
Alkaline Phosphatase: 68 U/L (ref 38–126)
Anion gap: 12 (ref 5–15)
BUN: 31 mg/dL — ABNORMAL HIGH (ref 8–23)
CO2: 16 mmol/L — ABNORMAL LOW (ref 22–32)
Calcium: 8.6 mg/dL — ABNORMAL LOW (ref 8.9–10.3)
Chloride: 111 mmol/L (ref 98–111)
Creatinine, Ser: 4.17 mg/dL — ABNORMAL HIGH (ref 0.61–1.24)
GFR, Estimated: 15 mL/min — ABNORMAL LOW (ref >60)
Glucose, Bld: 155 mg/dL — ABNORMAL HIGH (ref 70–99)
Potassium: 4 mmol/L (ref 3.5–5.1)
Sodium: 139 mmol/L (ref 135–145)
Total Bilirubin: 0.5 mg/dL (ref 0.3–1.2)
Total Protein: 6.2 g/dL — ABNORMAL LOW (ref 6.5–8.1)

## 2021-07-20 LAB — I-STAT CHEM 8, ED
BUN: 32 mg/dL — ABNORMAL HIGH (ref 8–23)
Calcium, Ion: 1.06 mmol/L — ABNORMAL LOW (ref 1.15–1.40)
Chloride: 112 mmol/L — ABNORMAL HIGH (ref 98–111)
Creatinine, Ser: 4.6 mg/dL — ABNORMAL HIGH (ref 0.61–1.24)
Glucose, Bld: 151 mg/dL — ABNORMAL HIGH (ref 70–99)
HCT: 29 % — ABNORMAL LOW (ref 39.0–52.0)
Hemoglobin: 9.9 g/dL — ABNORMAL LOW (ref 13.0–17.0)
Potassium: 3.9 mmol/L (ref 3.5–5.1)
Sodium: 141 mmol/L (ref 135–145)
TCO2: 16 mmol/L — ABNORMAL LOW (ref 22–32)

## 2021-07-20 LAB — CBC WITH DIFFERENTIAL/PLATELET
Abs Immature Granulocytes: 0.03 10*3/uL (ref 0.00–0.07)
Basophils Absolute: 0.1 10*3/uL (ref 0.0–0.1)
Basophils Relative: 2 %
Eosinophils Absolute: 0.4 10*3/uL (ref 0.0–0.5)
Eosinophils Relative: 5 %
HCT: 30.4 % — ABNORMAL LOW (ref 39.0–52.0)
Hemoglobin: 9 g/dL — ABNORMAL LOW (ref 13.0–17.0)
Immature Granulocytes: 0 %
Lymphocytes Relative: 13 %
Lymphs Abs: 0.9 10*3/uL (ref 0.7–4.0)
MCH: 26.9 pg (ref 26.0–34.0)
MCHC: 29.6 g/dL — ABNORMAL LOW (ref 30.0–36.0)
MCV: 91 fL (ref 80.0–100.0)
Monocytes Absolute: 0.5 10*3/uL (ref 0.1–1.0)
Monocytes Relative: 7 %
Neutro Abs: 5.2 10*3/uL (ref 1.7–7.7)
Neutrophils Relative %: 73 %
Platelets: 251 10*3/uL (ref 150–400)
RBC: 3.34 MIL/uL — ABNORMAL LOW (ref 4.22–5.81)
RDW: 17.5 % — ABNORMAL HIGH (ref 11.5–15.5)
WBC: 7.1 10*3/uL (ref 4.0–10.5)
nRBC: 0 % (ref 0.0–0.2)

## 2021-07-20 LAB — TROPONIN I (HIGH SENSITIVITY)
Troponin I (High Sensitivity): 41 ng/L — ABNORMAL HIGH (ref <18)
Troponin I (High Sensitivity): 44 ng/L — ABNORMAL HIGH (ref <18)

## 2021-07-20 MED ORDER — TECHNETIUM TO 99M ALBUMIN AGGREGATED
4.2000 | Freq: Once | INTRAVENOUS | Status: AC | PRN
  Administered 2021-07-20: 4.2 via INTRAVENOUS

## 2021-07-20 MED ORDER — ASPIRIN 81 MG PO CHEW
324.0000 mg | CHEWABLE_TABLET | Freq: Once | ORAL | Status: AC
  Administered 2021-07-20: 324 mg via ORAL
  Filled 2021-07-20: qty 4

## 2021-07-20 NOTE — ED Provider Triage Note (Signed)
Emergency Medicine Provider Triage Evaluation Note  [name] , a 66 y.o. male  was evaluated in triage.  Pt complains of chest tightness and shortness of breath.  Patient reports that symptoms started this morning approximately 0 800 while laying in bed.  Symptoms have been constant since then.  Patient states that he has felt lightheaded when standing and ambulating.  No radiation of pain.  Review of Systems  Positive: Chest tightness, shortness of breath Negative: Palpitations, syncope, hemoptysis, nausea, vomiting, diaphoresis  Physical Exam  BP (!) 189/126 (BP Location: Right Arm)    Pulse (!) 107    Temp 97.9 F (36.6 C)    Resp 16    SpO2 100%  Gen:   Awake, no distress   Resp:  Normal effort, lungs clear to auscultation bilaterally MSK:   Moves extremities without difficulty  Other:  +2 radial pulse bilaterally.  Medical Decision Making  Medically screening exam initiated at 12:26 PM.  Appropriate orders placed.  [name] was informed that the remainder of the evaluation will be completed by another provider, this initial triage assessment does not replace that evaluation, and the importance of remaining in the ED until their evaluation is complete.  ACS work-up initiated.  Patient due to patient's recent history of esophageal malignancy concern for possible PE.   Loni Beckwith, [place] 07/20/21 1227

## 2021-07-20 NOTE — ED Triage Notes (Signed)
Pt. Stated, Ive had chest tightness, SOB and tingling in my fingers that started this morning

## 2021-07-21 ENCOUNTER — Encounter (HOSPITAL_COMMUNITY): Payer: Self-pay

## 2021-07-21 ENCOUNTER — Other Ambulatory Visit: Payer: Self-pay

## 2021-07-21 DIAGNOSIS — I150 Renovascular hypertension: Secondary | ICD-10-CM | POA: Diagnosis not present

## 2021-07-21 MED ORDER — ISOSORB DINITRATE-HYDRALAZINE 20-37.5 MG PO TABS
1.0000 | ORAL_TABLET | Freq: Once | ORAL | Status: AC
  Administered 2021-07-21: 1 via ORAL
  Filled 2021-07-21: qty 1

## 2021-07-21 MED ORDER — FUROSEMIDE 10 MG/ML IJ SOLN
40.0000 mg | Freq: Once | INTRAMUSCULAR | Status: AC
  Administered 2021-07-21: 40 mg via INTRAVENOUS
  Filled 2021-07-21: qty 4

## 2021-07-21 MED ORDER — CARVEDILOL 12.5 MG PO TABS
25.0000 mg | ORAL_TABLET | Freq: Once | ORAL | Status: AC
  Administered 2021-07-21: 25 mg via ORAL
  Filled 2021-07-21: qty 2
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
## 2021-07-21 NOTE — Discharge Instructions (Signed)
You were seen today for shortness of breath.  Your blood pressure was elevated.  You are given your home blood pressure medications.  Given your history of labile blood pressures, no changes were made.  You need to follow-up closely with your cardiologist and your nephrologist.  You should not take any anti-inflammatory drugs such as ibuprofen or Aleve.

## 2021-07-21 NOTE — ED Notes (Signed)
Pt ambulated around room and maintained oxygen sats > 95% on RA.

## 2021-07-24 ENCOUNTER — Emergency Department (HOSPITAL_COMMUNITY): Payer: Medicare HMO

## 2021-07-24 ENCOUNTER — Inpatient Hospital Stay (HOSPITAL_COMMUNITY)
Admission: EM | Admit: 2021-07-24 | Discharge: 2021-08-02 | DRG: 291 | Disposition: A | Discharge status: Home / Self Care | Payer: Medicare HMO | Attending: Internal Medicine | Admitting: Internal Medicine

## 2021-07-24 ENCOUNTER — Other Ambulatory Visit: Payer: Self-pay

## 2021-07-24 ENCOUNTER — Encounter (HOSPITAL_COMMUNITY): Payer: Self-pay

## 2021-07-24 DIAGNOSIS — I252 Old myocardial infarction: Secondary | ICD-10-CM

## 2021-07-24 DIAGNOSIS — E782 Mixed hyperlipidemia: Secondary | ICD-10-CM | POA: Diagnosis not present

## 2021-07-24 DIAGNOSIS — N184 Chronic kidney disease, stage 4 (severe): Secondary | ICD-10-CM | POA: Diagnosis present

## 2021-07-24 DIAGNOSIS — Z800 Family history of malignant neoplasm of digestive organs: Secondary | ICD-10-CM | POA: Diagnosis not present

## 2021-07-24 DIAGNOSIS — I25708 Atherosclerosis of coronary artery bypass graft(s), unspecified, with other forms of angina pectoris: Secondary | ICD-10-CM | POA: Diagnosis present

## 2021-07-24 DIAGNOSIS — Z794 Long term (current) use of insulin: Secondary | ICD-10-CM

## 2021-07-24 DIAGNOSIS — Z951 Presence of aortocoronary bypass graft: Secondary | ICD-10-CM

## 2021-07-24 DIAGNOSIS — I110 Hypertensive heart disease with heart failure: Secondary | ICD-10-CM | POA: Diagnosis not present

## 2021-07-24 DIAGNOSIS — Z20822 Contact with and (suspected) exposure to covid-19: Secondary | ICD-10-CM | POA: Diagnosis not present

## 2021-07-24 DIAGNOSIS — R0602 Shortness of breath: Secondary | ICD-10-CM

## 2021-07-24 DIAGNOSIS — E876 Hypokalemia: Secondary | ICD-10-CM | POA: Diagnosis present

## 2021-07-24 DIAGNOSIS — N189 Chronic kidney disease, unspecified: Secondary | ICD-10-CM

## 2021-07-24 DIAGNOSIS — M7989 Other specified soft tissue disorders: Secondary | ICD-10-CM | POA: Diagnosis present

## 2021-07-24 DIAGNOSIS — Z8673 Personal history of transient ischemic attack (TIA), and cerebral infarction without residual deficits: Secondary | ICD-10-CM | POA: Diagnosis not present

## 2021-07-24 DIAGNOSIS — E1165 Type 2 diabetes mellitus with hyperglycemia: Secondary | ICD-10-CM | POA: Diagnosis not present

## 2021-07-24 DIAGNOSIS — Z91030 Bee allergy status: Secondary | ICD-10-CM

## 2021-07-24 DIAGNOSIS — I429 Cardiomyopathy, unspecified: Secondary | ICD-10-CM | POA: Diagnosis present

## 2021-07-24 DIAGNOSIS — H5461 Unqualified visual loss, right eye, normal vision left eye: Secondary | ICD-10-CM | POA: Diagnosis present

## 2021-07-24 DIAGNOSIS — F32A Depression, unspecified: Secondary | ICD-10-CM | POA: Diagnosis not present

## 2021-07-24 DIAGNOSIS — Z8501 Personal history of malignant neoplasm of esophagus: Secondary | ICD-10-CM | POA: Diagnosis not present

## 2021-07-24 DIAGNOSIS — R778 Other specified abnormalities of plasma proteins: Secondary | ICD-10-CM | POA: Diagnosis not present

## 2021-07-24 DIAGNOSIS — N1832 Chronic kidney disease, stage 3b: Secondary | ICD-10-CM | POA: Diagnosis not present

## 2021-07-24 DIAGNOSIS — E1122 Type 2 diabetes mellitus with diabetic chronic kidney disease: Secondary | ICD-10-CM | POA: Diagnosis present

## 2021-07-24 DIAGNOSIS — E872 Acidosis, unspecified: Secondary | ICD-10-CM | POA: Diagnosis present

## 2021-07-24 DIAGNOSIS — I248 Other forms of acute ischemic heart disease: Secondary | ICD-10-CM | POA: Diagnosis not present

## 2021-07-24 DIAGNOSIS — Z888 Allergy status to other drugs, medicaments and biological substances status: Secondary | ICD-10-CM | POA: Diagnosis not present

## 2021-07-24 DIAGNOSIS — I130 Hypertensive heart and chronic kidney disease with heart failure and stage 1 through stage 4 chronic kidney disease, or unspecified chronic kidney disease: Secondary | ICD-10-CM | POA: Diagnosis not present

## 2021-07-24 DIAGNOSIS — D631 Anemia in chronic kidney disease: Secondary | ICD-10-CM | POA: Diagnosis present

## 2021-07-24 DIAGNOSIS — R531 Weakness: Secondary | ICD-10-CM | POA: Diagnosis not present

## 2021-07-24 DIAGNOSIS — I5021 Acute systolic (congestive) heart failure: Secondary | ICD-10-CM | POA: Diagnosis not present

## 2021-07-24 DIAGNOSIS — Z7982 Long term (current) use of aspirin: Secondary | ICD-10-CM

## 2021-07-24 DIAGNOSIS — I2510 Atherosclerotic heart disease of native coronary artery without angina pectoris: Secondary | ICD-10-CM | POA: Diagnosis not present

## 2021-07-24 DIAGNOSIS — Z833 Family history of diabetes mellitus: Secondary | ICD-10-CM

## 2021-07-24 DIAGNOSIS — Z8249 Family history of ischemic heart disease and other diseases of the circulatory system: Secondary | ICD-10-CM

## 2021-07-24 DIAGNOSIS — Z8719 Personal history of other diseases of the digestive system: Secondary | ICD-10-CM | POA: Diagnosis not present

## 2021-07-24 DIAGNOSIS — Z87891 Personal history of nicotine dependence: Secondary | ICD-10-CM | POA: Diagnosis not present

## 2021-07-24 DIAGNOSIS — I5043 Acute on chronic combined systolic (congestive) and diastolic (congestive) heart failure: Secondary | ICD-10-CM | POA: Diagnosis not present

## 2021-07-24 DIAGNOSIS — I10 Essential (primary) hypertension: Secondary | ICD-10-CM | POA: Diagnosis present

## 2021-07-24 DIAGNOSIS — G4733 Obstructive sleep apnea (adult) (pediatric): Secondary | ICD-10-CM | POA: Diagnosis not present

## 2021-07-24 DIAGNOSIS — Z79899 Other long term (current) drug therapy: Secondary | ICD-10-CM

## 2021-07-24 DIAGNOSIS — R7989 Other specified abnormal findings of blood chemistry: Secondary | ICD-10-CM | POA: Diagnosis present

## 2021-07-24 DIAGNOSIS — I25118 Atherosclerotic heart disease of native coronary artery with other forms of angina pectoris: Secondary | ICD-10-CM | POA: Diagnosis not present

## 2021-07-24 DIAGNOSIS — M160 Bilateral primary osteoarthritis of hip: Secondary | ICD-10-CM | POA: Diagnosis present

## 2021-07-24 DIAGNOSIS — I517 Cardiomegaly: Secondary | ICD-10-CM | POA: Diagnosis not present

## 2021-07-24 LAB — CBC WITH DIFFERENTIAL/PLATELET
Abs Immature Granulocytes: 0.02 10*3/uL (ref 0.00–0.07)
Basophils Absolute: 0.1 10*3/uL (ref 0.0–0.1)
Basophils Relative: 1 %
Eosinophils Absolute: 0.3 10*3/uL (ref 0.0–0.5)
Eosinophils Relative: 4 %
HCT: 31.6 % — ABNORMAL LOW (ref 39.0–52.0)
Hemoglobin: 9.6 g/dL — ABNORMAL LOW (ref 13.0–17.0)
Immature Granulocytes: 0 %
Lymphocytes Relative: 13 %
Lymphs Abs: 0.8 10*3/uL (ref 0.7–4.0)
MCH: 27.6 pg (ref 26.0–34.0)
MCHC: 30.4 g/dL (ref 30.0–36.0)
MCV: 90.8 fL (ref 80.0–100.0)
Monocytes Absolute: 0.6 10*3/uL (ref 0.1–1.0)
Monocytes Relative: 10 %
Neutro Abs: 4.5 10*3/uL (ref 1.7–7.7)
Neutrophils Relative %: 72 %
Platelets: 238 10*3/uL (ref 150–400)
RBC: 3.48 MIL/uL — ABNORMAL LOW (ref 4.22–5.81)
RDW: 17.7 % — ABNORMAL HIGH (ref 11.5–15.5)
WBC: 6.3 10*3/uL (ref 4.0–10.5)
nRBC: 0 % (ref 0.0–0.2)

## 2021-07-24 MED ORDER — HYDRALAZINE HCL 25 MG PO TABS
50.0000 mg | ORAL_TABLET | Freq: Once | ORAL | Status: AC
Start: 2021-07-24 — End: 2021-07-24
  Administered 2021-07-24: 50 mg via ORAL
  Filled 2021-07-24: qty 2

## 2021-07-24 MED ORDER — ALBUTEROL SULFATE HFA 108 (90 BASE) MCG/ACT IN AERS: 2.0000 | INHALATION_SPRAY | RESPIRATORY_TRACT | Status: DC | PRN

## 2021-07-24 MED ORDER — FUROSEMIDE 10 MG/ML IJ SOLN
80.0000 mg | Freq: Once | INTRAMUSCULAR | Status: AC
  Administered 2021-07-24: 80 mg via INTRAVENOUS
  Filled 2021-07-24: qty 8

## 2021-07-24 NOTE — ED Notes (Signed)
ED Provider at bedside. 

## 2021-07-24 NOTE — ED Provider Notes (Signed)
[facility] EMERGENCY DEPARTMENT Provider Note   CSN: [id number] Arrival date & time: 07/24/21  2224     History  Chief Complaint  Patient presents with   Shortness of Breath    [name] is a 66 y.o. male.  Patient presents to the emergency department for evaluation of shortness of breath.  Patient reports that he has been experiencing progressively worsening fatigue and shortness of breath over period of couple of weeks.  He has noticed increased swelling of his legs and now inability to lie flat because of increased shortness of breath.  He was seen at [facility] recently for chest pain but is not experiencing chest pain tonight.      Home Medications Prior to Admission medications   Medication Sig Start Date End Date Taking? Authorizing Provider  acetaminophen (TYLENOL) 500 MG tablet Take 1,000 mg by mouth every 6 (six) hours as needed for moderate pain or headache.     [provider]  albuterol (VENTOLIN HFA) 108 (90 Base) MCG/ACT inhaler Inhale 2 puffs into the lungs every 6 (six) hours as needed. Patient taking differently: Inhale 2 puffs into the lungs every 6 (six) hours as needed for wheezing or shortness of breath. 06/24/21   Coral Spikes, DO  aspirin EC 81 MG tablet Take 324 mg by mouth daily. Swallow whole. 02/26/21   Rogene Houston, MD  carvedilol (COREG) 12.5 MG tablet Take 1 tablet (12.5 mg total) by mouth 2 (two) times daily. 02/05/21   Loel Dubonnet, NP  celecoxib (CELEBREX) 200 MG capsule Take 1 capsule (200 mg total) by mouth 2 (two) times daily. Patient not taking: Reported on 07/21/2021 06/24/21   [name], MD  diphenhydrAMINE-APAP, sleep, (TYLENOL PM EXTRA STRENGTH PO) Take 2 tablets by mouth at bedtime as needed (sleep).    [provider]  doxazosin (CARDURA) 4 MG tablet Take 4 mg by mouth daily.    [provider]  EPINEPHrine 0.3 mg/0.3 mL IJ SOAJ injection Inject 0.3 mg into the muscle as needed for anaphylaxis.  01/25/21   [name], MD  furosemide (LASIX) 40 MG tablet Take 40 mg by mouth daily. Patient taking differently: Take 40 mg by mouth daily. 02/25/21   [name], MD  Ibuprofen-diphenhydrAMINE Cit (MOTRIN PM PO) Take 2 tablets by mouth at bedtime as needed (sleep/pain).    [provider]  Insulin Pen Needle (B-D ULTRAFINE III SHORT PEN) 31G X 8 MM MISC 1 each by Does not apply route as directed. 05/30/19   Cassandria Anger, MD  isosorbide-hydrALAZINE (BIDIL) 20-37.5 MG tablet Take 1 tablet by mouth in the morning and at bedtime. 02/23/21   Skeet Latch, MD  metoCLOPramide (REGLAN) 10 MG tablet Take 1 tablet (10 mg total) by mouth 3 (three) times daily before meals. 07/15/21   Rogene Houston, MD  Multiple Vitamin (MULTIVITAMIN WITH MINERALS) TABS tablet Take 1 tablet by mouth daily.    [provider]  Ondansetron 4 MG FILM Take 4 mg by mouth 3 (three) times daily as needed. Patient taking differently: Take 4 mg by mouth 3 (three) times daily as needed (nausea/vomiting). 07/15/21   Rogene Houston, MD  OZEMPIC, 1 MG/DOSE, 4 MG/3ML SOPN Inject 1 mg into the skin every Sunday. 01/22/21   [provider]  pantoprazole (PROTONIX) 40 MG tablet Take 1 tablet (40 mg total) by mouth 2 (two) times daily before a meal. 03/23/21   Thurnell Lose, MD  REPATHA SURECLICK 956 MG/ML SOAJ Inject 140 mg as directed every 14 (fourteen) days. 06/01/21   [provider]  sertraline (ZOLOFT) 100 MG tablet TAKE 1 TABLET BY MOUTH DAILY Patient taking differently: Take 100 mg by mouth daily. 04/09/21   [name], MD  traMADol (ULTRAM) 50 MG tablet Take 25 mg by mouth every 6 (six) hours as needed for moderate pain.    [provider]  VITAMIN D PO Take 1,000 Units by mouth daily.    [provider]      Allergies    Bee venom, Atorvastatin, Diltiazem, Rosuvastatin, and Valsartan    Review of Systems   Review of Systems  Respiratory:   Positive for shortness of breath.   Cardiovascular:  Positive for leg swelling.   Physical Exam Updated Vital Signs BP (!) 165/91    Pulse 94    Temp 97.7 F (36.5 C) (Oral)    Resp (!) 21    Ht 5\' 10"  (1.778 m)    Wt 90.7 kg    SpO2 98%    BMI 28.70 kg/m  Physical Exam Vitals and nursing note reviewed.  Constitutional:      General: He is not in acute distress.    Appearance: He is well-developed.  HENT:     Head: Normocephalic and atraumatic.     Mouth/Throat:     Mouth: Mucous membranes are moist.  Eyes:     General: Vision grossly intact. Gaze aligned appropriately.     Extraocular Movements: Extraocular movements intact.     Conjunctiva/sclera: Conjunctivae normal.  Cardiovascular:     Rate and Rhythm: Normal rate and regular rhythm.     Pulses: Normal pulses.     Heart sounds: Normal heart sounds, S1 normal and S2 normal. No murmur heard.   No friction rub. No gallop.  Pulmonary:     Effort: Pulmonary effort is normal. Tachypnea present. No respiratory distress.     Breath sounds: Normal breath sounds.  Abdominal:     Palpations: Abdomen is soft.     Tenderness: There is no abdominal tenderness. There is no guarding or rebound.     Hernia: No hernia is present.  Musculoskeletal:        General: No swelling.     Cervical back: Full passive range of motion without pain, normal range of motion and neck supple. No pain with movement, spinous process tenderness or muscular tenderness. Normal range of motion.     Right lower leg: Edema present.     Left lower leg: Edema present.  Skin:    General: Skin is warm and dry.     Capillary Refill: Capillary refill takes less than 2 seconds.     Findings: No ecchymosis, erythema, lesion or wound.  Neurological:     Mental Status: He is alert and oriented to person, place, and time.     GCS: GCS eye subscore is 4. GCS verbal subscore is 5. GCS motor subscore is 6.     Cranial Nerves: Cranial nerves 2-12 are intact.     Sensory:  Sensation is intact.     Motor: Motor function is intact. No weakness or abnormal muscle tone.     Coordination: Coordination is intact.  Psychiatric:        Mood and Affect: Mood normal.        Speech: Speech normal.        Behavior: Behavior normal.    ED Results / Procedures / Treatments  Labs (all labs ordered are listed, but only abnormal results are displayed) Labs Reviewed  CBC WITH DIFFERENTIAL/PLATELET - Abnormal; Notable for the following components:      Result Value   RBC 3.48 (*)    Hemoglobin 9.6 (*)    HCT 31.6 (*)    RDW 17.7 (*)    All other components within normal limits  COMPREHENSIVE METABOLIC PANEL - Abnormal; Notable for the following components:   CO2 20 (*)    Glucose, Bld 229 (*)    BUN 46 (*)    Creatinine, Ser 3.80 (*)    Calcium 8.4 (*)    AST 13 (*)    GFR, Estimated 17 (*)    All other components within normal limits  BRAIN NATRIURETIC PEPTIDE - Abnormal; Notable for the following components:   B Natriuretic Peptide >4,500.0 (*)    All other components within normal limits  TROPONIN I (HIGH SENSITIVITY) - Abnormal; Notable for the following components:   Troponin I (High Sensitivity) 36 (*)    All other components within normal limits  RESP PANEL BY RT-PCR (FLU A&B, COVID) ARPGX2  TROPONIN I (HIGH SENSITIVITY)    EKG EKG Interpretation  Date/Time:  Saturday July 24 2021 22:46:57 EST Ventricular Rate:  107 PR Interval:  181 QRS Duration: 117 QT Interval:  340 QTC Calculation: 454 R Axis:   91 Text Interpretation: Sinus tachycardia Probable left atrial enlargement Nonspecific intraventricular conduction delay Probable inferior infarct, age indeterminate Lateral leads are also involved Baseline wander in lead(s) V1 No significant change since last tracing Confirmed by Orpah Greek [phone]) on 07/24/2021 11:23:07 PM  Radiology DG Chest Port 1 View  Result Date: 07/24/2021 CLINICAL DATA:  Shortness of breath and weakness  over the last several weeks. EXAM: PORTABLE CHEST 1 VIEW COMPARISON:  07/20/2021 FINDINGS: Artifact overlies the chest. Previous median sternotomy and CABG. Mild cardiomegaly. No evidence of heart failure or effusion. The lungs are clear, allowing for a slightly suboptimal inspiration. IMPRESSION: Previous CABG.  Cardiomegaly.  No active disease suspected. Electronically Signed   By: Nelson Chimes M.D.   On: 07/24/2021 23:21    Procedures Procedures    Medications Ordered in ED Medications  albuterol (VENTOLIN HFA) 108 (90 Base) MCG/ACT inhaler 2 puff (has no administration in time range)  furosemide (LASIX) injection 80 mg (80 mg Intravenous Given 07/24/21 2343)  hydrALAZINE (APRESOLINE) tablet 50 mg (50 mg Oral Given 07/24/21 2342)    ED Course/ Medical Decision Making/ A&P                           Medical Decision Making Amount and/or Complexity of Data Reviewed Labs: ordered.  Risk Prescription drug management.   Patient with past medical history of hypertension, diabetes, hyperlipidemia, combined systolic and diastolic heart failure, CAD presents to the emergency department for evaluation of shortness of breath.  Patient has been experiencing progressively worsening shortness of breath and swelling of the legs, now experiencing severe orthopnea.  Differential diagnosis would be ACS, NSTEMI, decompensated congestive heart failure, pneumonia  Patient sinus rhythm on the monitor.  EKG without ischemic changes.  Troponin mildly elevated, but at his normal baseline.  Patient exhibiting clinical signs of significant volume overload.  BNP greater than 4500.  He was administered Lasix 80 mg IV.  He has already taken an additional dose of Lasix at home tonight because of the swelling and has had minimal urine output.  He has chronic  renal insufficiency, creatinine around his baseline.  Will require hospitalization for further diuresis.        Final Clinical Impression(s) / ED  Diagnoses Final diagnoses:  Acute on chronic combined systolic and diastolic congestive heart failure ([place])    Rx / DC Orders ED Discharge Orders     None         Gianpaolo Mindel, Gwenyth Allegra, MD 07/25/21 0120

## 2021-07-24 NOTE — ED Triage Notes (Signed)
Pt c/o SOB, weakness, fatigue that started a few weeks ago but has gotten worse. Pt states if he lays down he cannot breathe.

## 2021-07-25 ENCOUNTER — Inpatient Hospital Stay (HOSPITAL_COMMUNITY): Payer: Medicare HMO

## 2021-07-25 ENCOUNTER — Encounter (HOSPITAL_COMMUNITY): Payer: Self-pay | Admitting: Internal Medicine

## 2021-07-25 DIAGNOSIS — Z951 Presence of aortocoronary bypass graft: Secondary | ICD-10-CM | POA: Diagnosis not present

## 2021-07-25 DIAGNOSIS — I25708 Atherosclerosis of coronary artery bypass graft(s), unspecified, with other forms of angina pectoris: Secondary | ICD-10-CM

## 2021-07-25 DIAGNOSIS — I5021 Acute systolic (congestive) heart failure: Secondary | ICD-10-CM | POA: Diagnosis not present

## 2021-07-25 DIAGNOSIS — G4733 Obstructive sleep apnea (adult) (pediatric): Secondary | ICD-10-CM | POA: Diagnosis present

## 2021-07-25 DIAGNOSIS — I2510 Atherosclerotic heart disease of native coronary artery without angina pectoris: Secondary | ICD-10-CM | POA: Diagnosis not present

## 2021-07-25 DIAGNOSIS — Z87891 Personal history of nicotine dependence: Secondary | ICD-10-CM | POA: Diagnosis not present

## 2021-07-25 DIAGNOSIS — N1832 Chronic kidney disease, stage 3b: Secondary | ICD-10-CM | POA: Diagnosis not present

## 2021-07-25 DIAGNOSIS — Z888 Allergy status to other drugs, medicaments and biological substances status: Secondary | ICD-10-CM | POA: Diagnosis not present

## 2021-07-25 DIAGNOSIS — R778 Other specified abnormalities of plasma proteins: Secondary | ICD-10-CM

## 2021-07-25 DIAGNOSIS — Z20822 Contact with and (suspected) exposure to covid-19: Secondary | ICD-10-CM | POA: Diagnosis not present

## 2021-07-25 DIAGNOSIS — I252 Old myocardial infarction: Secondary | ICD-10-CM | POA: Diagnosis not present

## 2021-07-25 DIAGNOSIS — Z8249 Family history of ischemic heart disease and other diseases of the circulatory system: Secondary | ICD-10-CM | POA: Diagnosis not present

## 2021-07-25 DIAGNOSIS — D631 Anemia in chronic kidney disease: Secondary | ICD-10-CM

## 2021-07-25 DIAGNOSIS — I10 Essential (primary) hypertension: Secondary | ICD-10-CM | POA: Diagnosis not present

## 2021-07-25 DIAGNOSIS — E782 Mixed hyperlipidemia: Secondary | ICD-10-CM | POA: Diagnosis present

## 2021-07-25 DIAGNOSIS — I5043 Acute on chronic combined systolic (congestive) and diastolic (congestive) heart failure: Secondary | ICD-10-CM | POA: Diagnosis present

## 2021-07-25 DIAGNOSIS — I248 Other forms of acute ischemic heart disease: Secondary | ICD-10-CM | POA: Diagnosis not present

## 2021-07-25 DIAGNOSIS — Z833 Family history of diabetes mellitus: Secondary | ICD-10-CM | POA: Diagnosis not present

## 2021-07-25 DIAGNOSIS — Z800 Family history of malignant neoplasm of digestive organs: Secondary | ICD-10-CM | POA: Diagnosis not present

## 2021-07-25 DIAGNOSIS — N184 Chronic kidney disease, stage 4 (severe): Secondary | ICD-10-CM | POA: Diagnosis present

## 2021-07-25 DIAGNOSIS — R7989 Other specified abnormal findings of blood chemistry: Secondary | ICD-10-CM | POA: Diagnosis not present

## 2021-07-25 DIAGNOSIS — E876 Hypokalemia: Secondary | ICD-10-CM | POA: Diagnosis present

## 2021-07-25 DIAGNOSIS — Z8719 Personal history of other diseases of the digestive system: Secondary | ICD-10-CM | POA: Diagnosis not present

## 2021-07-25 DIAGNOSIS — Z8673 Personal history of transient ischemic attack (TIA), and cerebral infarction without residual deficits: Secondary | ICD-10-CM | POA: Diagnosis not present

## 2021-07-25 DIAGNOSIS — E1165 Type 2 diabetes mellitus with hyperglycemia: Secondary | ICD-10-CM | POA: Diagnosis not present

## 2021-07-25 DIAGNOSIS — I429 Cardiomyopathy, unspecified: Secondary | ICD-10-CM | POA: Diagnosis not present

## 2021-07-25 DIAGNOSIS — F32A Depression, unspecified: Secondary | ICD-10-CM | POA: Diagnosis not present

## 2021-07-25 DIAGNOSIS — E872 Acidosis, unspecified: Secondary | ICD-10-CM | POA: Diagnosis not present

## 2021-07-25 DIAGNOSIS — E1122 Type 2 diabetes mellitus with diabetic chronic kidney disease: Secondary | ICD-10-CM | POA: Diagnosis not present

## 2021-07-25 DIAGNOSIS — Z8501 Personal history of malignant neoplasm of esophagus: Secondary | ICD-10-CM | POA: Diagnosis not present

## 2021-07-25 DIAGNOSIS — I130 Hypertensive heart and chronic kidney disease with heart failure and stage 1 through stage 4 chronic kidney disease, or unspecified chronic kidney disease: Secondary | ICD-10-CM | POA: Diagnosis not present

## 2021-07-25 LAB — COMPREHENSIVE METABOLIC PANEL
ALT: 14 U/L (ref 0–44)
ALT: 15 U/L (ref 0–44)
AST: 11 U/L — ABNORMAL LOW (ref 15–41)
AST: 13 U/L — ABNORMAL LOW (ref 15–41)
Albumin: 3.3 g/dL — ABNORMAL LOW (ref 3.5–5.0)
Albumin: 3.5 g/dL (ref 3.5–5.0)
Alkaline Phosphatase: 80 U/L (ref 38–126)
Alkaline Phosphatase: 85 U/L (ref 38–126)
Anion gap: 9 (ref 5–15)
Anion gap: 9 (ref 5–15)
BUN: 45 mg/dL — ABNORMAL HIGH (ref 8–23)
BUN: 46 mg/dL — ABNORMAL HIGH (ref 8–23)
CO2: 20 mmol/L — ABNORMAL LOW (ref 22–32)
CO2: 20 mmol/L — ABNORMAL LOW (ref 22–32)
Calcium: 8.3 mg/dL — ABNORMAL LOW (ref 8.9–10.3)
Calcium: 8.4 mg/dL — ABNORMAL LOW (ref 8.9–10.3)
Chloride: 110 mmol/L (ref 98–111)
Chloride: 110 mmol/L (ref 98–111)
Creatinine, Ser: 3.71 mg/dL — ABNORMAL HIGH (ref 0.61–1.24)
Creatinine, Ser: 3.8 mg/dL — ABNORMAL HIGH (ref 0.61–1.24)
GFR, Estimated: 17 mL/min — ABNORMAL LOW (ref >60)
GFR, Estimated: 17 mL/min — ABNORMAL LOW (ref >60)
Glucose, Bld: 173 mg/dL — ABNORMAL HIGH (ref 70–99)
Glucose, Bld: 229 mg/dL — ABNORMAL HIGH (ref 70–99)
Potassium: 3.4 mmol/L — ABNORMAL LOW (ref 3.5–5.1)
Potassium: 3.7 mmol/L (ref 3.5–5.1)
Sodium: 139 mmol/L (ref 135–145)
Sodium: 139 mmol/L (ref 135–145)
Total Bilirubin: 0.4 mg/dL (ref 0.3–1.2)
Total Bilirubin: 0.6 mg/dL (ref 0.3–1.2)
Total Protein: 6 g/dL — ABNORMAL LOW (ref 6.5–8.1)
Total Protein: 6.6 g/dL (ref 6.5–8.1)

## 2021-07-25 LAB — PHOSPHORUS: Phosphorus: 4 mg/dL (ref 2.5–4.6)

## 2021-07-25 LAB — ECHOCARDIOGRAM COMPLETE
Area-P 1/2: 4.96 cm2
Calc EF: 36.4 %
Height: 70 in
MV M vel: 4.72 m/s
MV Peak grad: 89.1 mmHg
S' Lateral: 4.6 cm
Single Plane A2C EF: 30.5 %
Single Plane A4C EF: 42.5 %
Weight: 3353.6 oz

## 2021-07-25 LAB — CBC
HCT: 29.6 % — ABNORMAL LOW (ref 39.0–52.0)
Hemoglobin: 8.8 g/dL — ABNORMAL LOW (ref 13.0–17.0)
MCH: 26.7 pg (ref 26.0–34.0)
MCHC: 29.7 g/dL — ABNORMAL LOW (ref 30.0–36.0)
MCV: 90 fL (ref 80.0–100.0)
Platelets: 234 10*3/uL (ref 150–400)
RBC: 3.29 MIL/uL — ABNORMAL LOW (ref 4.22–5.81)
RDW: 17.3 % — ABNORMAL HIGH (ref 11.5–15.5)
WBC: 6.8 10*3/uL (ref 4.0–10.5)
nRBC: 0 % (ref 0.0–0.2)

## 2021-07-25 LAB — BRAIN NATRIURETIC PEPTIDE: B Natriuretic Peptide: >4500 pg/mL — ABNORMAL HIGH (ref 0.0–100.0)

## 2021-07-25 LAB — RESP PANEL BY RT-PCR (FLU A&B, COVID) ARPGX2
Influenza A by PCR: NEGATIVE
Influenza B by PCR: NEGATIVE
SARS Coronavirus 2 by RT PCR: NEGATIVE

## 2021-07-25 LAB — GLUCOSE, CAPILLARY
Glucose-Capillary: 120 mg/dL — ABNORMAL HIGH (ref 70–99)
Glucose-Capillary: 204 mg/dL — ABNORMAL HIGH (ref 70–99)
Glucose-Capillary: 148 mg/dL — ABNORMAL HIGH (ref 70–99)
Glucose-Capillary: 152 mg/dL — ABNORMAL HIGH (ref 70–99)

## 2021-07-25 LAB — TROPONIN I (HIGH SENSITIVITY)
Troponin I (High Sensitivity): 36 ng/L — ABNORMAL HIGH (ref <18)
Troponin I (High Sensitivity): 39 ng/L — ABNORMAL HIGH (ref <18)
Troponin I (High Sensitivity): 39 ng/L — ABNORMAL HIGH (ref <18)
Troponin I (High Sensitivity): 40 ng/L — ABNORMAL HIGH (ref <18)

## 2021-07-25 LAB — APTT: aPTT: 33 seconds (ref 24–36)

## 2021-07-25 LAB — HEMOGLOBIN A1C
Hgb A1c MFr Bld: 6.4 % — ABNORMAL HIGH (ref 4.8–5.6)
Mean Plasma Glucose: 136.98 mg/dL

## 2021-07-25 LAB — MAGNESIUM: Magnesium: 2.1 mg/dL (ref 1.7–2.4)

## 2021-07-25 MED ORDER — PANTOPRAZOLE SODIUM 40 MG PO TBEC
40.0000 mg | DELAYED_RELEASE_TABLET | Freq: Every day | ORAL | Status: DC
  Administered 2021-07-25 – 2021-07-31 (×7): 40 mg via ORAL
  Filled 2021-07-25 (×7): qty 1

## 2021-07-25 MED ORDER — METOCLOPRAMIDE HCL 10 MG PO TABS
10.0000 mg | ORAL_TABLET | Freq: Three times a day (TID) | ORAL | Status: DC
  Administered 2021-07-25 – 2021-08-02 (×24): 10 mg via ORAL
  Filled 2021-07-25 (×24): qty 1

## 2021-07-25 MED ORDER — POTASSIUM CHLORIDE CRYS ER 20 MEQ PO TBCR
20.0000 meq | EXTENDED_RELEASE_TABLET | Freq: Once | ORAL | Status: AC
  Administered 2021-07-25: 20 meq via ORAL
  Filled 2021-07-25: qty 1

## 2021-07-25 MED ORDER — ADULT MULTIVITAMIN W/MINERALS CH
1.0000 | ORAL_TABLET | Freq: Every day | ORAL | Status: DC
  Administered 2021-07-25 – 2021-08-02 (×9): 1 via ORAL
  Filled 2021-07-25 (×9): qty 1

## 2021-07-25 MED ORDER — ENOXAPARIN SODIUM 30 MG/0.3ML IJ SOSY
30.0000 mg | PREFILLED_SYRINGE | INTRAMUSCULAR | Status: DC
  Administered 2021-07-25 – 2021-08-02 (×9): 30 mg via SUBCUTANEOUS
  Filled 2021-07-25 (×9): qty 0.3

## 2021-07-25 MED ORDER — GUAIFENESIN 100 MG/5ML PO LIQD
5.0000 mL | ORAL | Status: DC | PRN
  Administered 2021-07-25 – 2021-07-31 (×11): 5 mL via ORAL
  Filled 2021-07-25 (×11): qty 5

## 2021-07-25 MED ORDER — SERTRALINE HCL 50 MG PO TABS
100.0000 mg | ORAL_TABLET | Freq: Every day | ORAL | Status: DC
  Administered 2021-07-25 – 2021-08-02 (×9): 100 mg via ORAL
  Filled 2021-07-25 (×9): qty 2

## 2021-07-25 MED ORDER — PERFLUTREN LIPID MICROSPHERE
1.0000 mL | INTRAVENOUS | Status: AC | PRN
  Administered 2021-07-25: 2 mL via INTRAVENOUS

## 2021-07-25 MED ORDER — CARVEDILOL 12.5 MG PO TABS
12.5000 mg | ORAL_TABLET | Freq: Two times a day (BID) | ORAL | Status: DC
  Administered 2021-07-25 – 2021-08-02 (×17): 12.5 mg via ORAL
  Filled 2021-07-25 (×17): qty 1

## 2021-07-25 MED ORDER — ISOSORB DINITRATE-HYDRALAZINE 20-37.5 MG PO TABS
1.0000 | ORAL_TABLET | Freq: Two times a day (BID) | ORAL | Status: DC
  Administered 2021-07-25 – 2021-07-26 (×4): 1 via ORAL
  Filled 2021-07-25 (×4): qty 1

## 2021-07-25 MED ORDER — DOXAZOSIN MESYLATE 2 MG PO TABS
4.0000 mg | ORAL_TABLET | Freq: Every day | ORAL | Status: DC
  Administered 2021-07-25 – 2021-08-02 (×9): 4 mg via ORAL
  Filled 2021-07-25 (×9): qty 2

## 2021-07-25 MED ORDER — POTASSIUM CHLORIDE CRYS ER 10 MEQ PO TBCR
10.0000 meq | EXTENDED_RELEASE_TABLET | Freq: Every day | ORAL | Status: DC
  Administered 2021-07-25 – 2021-08-02 (×9): 10 meq via ORAL
  Filled 2021-07-25 (×9): qty 1

## 2021-07-25 MED ORDER — INSULIN ASPART 100 UNIT/ML IJ SOLN
0.0000 [IU] | Freq: Three times a day (TID) | INTRAMUSCULAR | Status: DC
  Administered 2021-07-25: 3 [IU] via SUBCUTANEOUS
  Administered 2021-07-25: 1 [IU] via SUBCUTANEOUS
  Administered 2021-07-26: 3 [IU] via SUBCUTANEOUS
  Administered 2021-07-26: 2 [IU] via SUBCUTANEOUS
  Administered 2021-07-27: 1 [IU] via SUBCUTANEOUS
  Administered 2021-07-27: 2 [IU] via SUBCUTANEOUS
  Administered 2021-07-27 – 2021-07-28 (×2): 1 [IU] via SUBCUTANEOUS
  Administered 2021-07-28: 2 [IU] via SUBCUTANEOUS
  Administered 2021-07-28 – 2021-07-29 (×2): 1 [IU] via SUBCUTANEOUS
  Administered 2021-07-29: 3 [IU] via SUBCUTANEOUS
  Administered 2021-07-29 – 2021-07-30 (×2): 2 [IU] via SUBCUTANEOUS
  Administered 2021-07-30: 3 [IU] via SUBCUTANEOUS
  Administered 2021-07-31: 2 [IU] via SUBCUTANEOUS
  Administered 2021-07-31: 1 [IU] via SUBCUTANEOUS
  Administered 2021-07-31 – 2021-08-01 (×3): 3 [IU] via SUBCUTANEOUS
  Administered 2021-08-01: 1 [IU] via SUBCUTANEOUS
  Administered 2021-08-02: 2 [IU] via SUBCUTANEOUS

## 2021-07-25 MED ORDER — ASPIRIN EC 81 MG PO TBEC
324.0000 mg | DELAYED_RELEASE_TABLET | Freq: Every day | ORAL | Status: DC
  Administered 2021-07-25 – 2021-08-02 (×9): 324 mg via ORAL
  Filled 2021-07-25 (×10): qty 4

## 2021-07-25 MED ORDER — FUROSEMIDE 10 MG/ML IJ SOLN
40.0000 mg | Freq: Two times a day (BID) | INTRAMUSCULAR | Status: DC
  Administered 2021-07-25 – 2021-07-27 (×5): 40 mg via INTRAVENOUS
  Filled 2021-07-25 (×5): qty 4

## 2021-07-25 MED ORDER — TRAMADOL HCL 50 MG PO TABS: 25.0000 mg | ORAL_TABLET | Freq: Four times a day (QID) | ORAL | Status: DC | PRN

## 2021-07-25 MED ORDER — INSULIN ASPART 100 UNIT/ML IJ SOLN
0.0000 [IU] | Freq: Every day | INTRAMUSCULAR | Status: DC
  Administered 2021-07-30: 2 [IU] via SUBCUTANEOUS

## 2021-07-25 NOTE — Assessment & Plan Note (Signed)
Rule out CHF

## 2021-07-25 NOTE — Assessment & Plan Note (Addendum)
Cont CPAP

## 2021-07-25 NOTE — Assessment & Plan Note (Addendum)
Poorly controlled.  Continue home Coreg, Cardura.  Increase BiDil to 3 times daily.  Continue diuresis hopefully blood pressure will stabilize.  He was orthostatic on higher dose of doxazosin in the past.And has not tolerated Norvasc lisinopril HCTZ beta-blocker due to fatigue

## 2021-07-25 NOTE — Progress Notes (Signed)
MD notified of patients blood pressure of  170/98 manuel pressure of 170/100. No new orders obtained. Will continue to monitor.

## 2021-07-25 NOTE — Assessment & Plan Note (Addendum)
Chest pain overnight but reproducible.  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  Nuclear stress 9/22 mostly fixed basal to mild inferior defects, scar with minimal peri-infarct ischemia.  Continue diuresis hopefully with it symptoms will improve.  Continue home   aspirin Coreg.Not on statin due to intolerance.

## 2021-07-25 NOTE — Hospital Course (Addendum)
66 y.o. male with medical history significant of malignant neoplasm of esophagus, essential hypertension, CAD, H8DU, combine systolic and diastolic heart failure, CKD stage IV, sleep apnea on BiPAP her last ED visit at [facility] 2/7 due to chest pain seen in the ED and discharged, presents W/ several weeks of worsening shortness of breath on lying flat.  In ED- tachypneic, BP was 174/103  Labw w/ anemia, BUN/creatinine 46/3.80 (creatinine within baseline range), CBG 229, BNP > 4500, troponin x2 - 36 > 39, magnesium 2.1, phosphorus 4.0.  Influenza A, B, SARS coronavirus 2 was negative.cxr-previous CABG.  Cardiomegaly,IV Lasix 60 mgx1 and admitted for chf exacerbation. Patient had clinical improvement diuretics.  Renal function with CKD remained stable. Bnp >4500-- 3538 w/ diuresis.  Seen by cardiology in consultation and managed with diuresis.  He had issues w/ elevated blood pressure  and some chest pain during hospitalization likely noncardiac, serial troponins are negative

## 2021-07-25 NOTE — Assessment & Plan Note (Addendum)
Renal function at baseline- which is 3.5-4.  Stable monitor closely while on diuresis Recent Labs  Lab 07/20/21 1304 07/24/21 2338 07/25/21 0231 07/26/21 0449 07/27/21 0526  BUN 32* 46* 45* 44* 44*  CREATININE 4.60* 3.80* 3.71* 3.73* 3.78*

## 2021-07-25 NOTE — Assessment & Plan Note (Addendum)
Continue sliding scale insulin.  Blood sugars stable.  Last A1c 6.4. Recent Labs  Lab 07/25/21 0539 07/25/21 0739 07/26/21 0739 07/26/21 1103 07/26/21 1623 07/26/21 2013 07/27/21 0710  GLUCAP  --    < > 119* 225* 173* 193* 140*  HGBA1C 6.4*  --   --   --   --   --   --    < > = values in this interval not displayed.

## 2021-07-25 NOTE — Assessment & Plan Note (Addendum)
-  lvef 40-45 % 07/25/21, slightly worse or may be similar lvef 45-50% 02/04/21. -Overall all improving but slowly,Net IO Since Admission: -1,920 mL [07/27/21 1045] appreciate cardiology input increasing Lasix to 60 twice daily as he has ongoing fluid overload and edema.   -Continue home Coreg Cardura, BiDil increased to 3 times daily for additional BP control and chf. -Monitor I/O, daily weight, keep on salt and fluid restriction.  Cardiology consulted. His previous weight on January 25 was 95.6 kg-wt changes see below Filed Weights   07/25/21 0249 07/26/21 0538 07/27/21 0500  Weight: 95.1 kg 93.7 kg 94 kg

## 2021-07-25 NOTE — H&P (Signed)
.ADE History and Physical    Patient: [name] TDD:[id number] DOB: 1955/12/25 DOA: 07/24/2021 DOS: the patient was seen and examined on 07/25/2021 PCP: [name], MD  Patient coming from: Home  Chief Complaint: Shortness of breath  Chief Complaint  Patient presents with   Shortness of Breath    HPI: [name] is a 66 y.o. male with medical history significant of malignant neoplasm of esophagus, essential hypertension, CAD, W2BJ, combine systolic and diastolic heart failure, CKD stage IV, sleep apnea on BiPAP who presents to the emergency department due to several weeks of increasing shortness of breath and fatigue.  He endorses increased leg swelling and worsening shortness of breath on lying flat.  Patient states that he went to [facility] ED on 07/20/2021 due to chest pain during which he was treated in the ED and discharged home.  He denies chest pain at this time, patient also denies fever, chills, headache, nausea, vomiting, abdominal pain, diarrhea, constipation.  ED course: In the emergency department, patient was tachypneic, BP was 174/103 and other vital signs were within normal range.  Work-up in the ED showed normocytic anemia, BUN/creatinine 46/3.80 (creatinine within baseline range), CBG 229, BNP > 4500, troponin x2 - 36 > 39, magnesium 2.1, phosphorus 4.0.  Influenza A, B, SARS coronavirus 2 was negative. Chest x-ray showed previous CABG.  Cardiomegaly.  No active disease suspected IV Lasix 60 mg x 1 and hydralazine was given.  Hospitalist was asked to admit patient for further evaluation and management.  Review of Systems: As mentioned in the history of present illness. All other systems reviewed and are negative. Past Medical History:  Diagnosis Date   Anemia    Arthritis    hips shoulders   CAD (coronary artery disease) 03/03/2019   Cancer ([place])    Esophageal cancer 2022   CHF (congestive heart failure) ([place]) 2020   Chronic combined systolic and  diastolic heart failure ([place]) 04/26/2018   Admitted 11/14-11/20/19-diuresed 10L   Depression 05/31/2021   Fatigue 10/14/2020   Hypertension    Myocardial infarction [facility])    Sleep apnea    uses a bipap machine   Stroke ([place]) 12/14/2020   no last weakness or paralysis   Type 2 diabetes mellitus with diabetic nephropathy ([place]) 04/23/2019   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   Past Surgical History:  Procedure Laterality Date   BIOPSY  06/27/2019   Procedure: BIOPSY;  Surgeon: Rogene Houston, MD;  [facility];  Service: Endoscopy;;  ascending colon polyp   BIOPSY  10/21/2020   Procedure: BIOPSY;  Surgeon: Rogene Houston, MD;  [facility];  Service: Endoscopy;;  esophageal,gastric polyp   BIOPSY  02/24/2021   Procedure: BIOPSY;  Surgeon: Rogene Houston, MD;  [facility];  Service: Endoscopy;;  distal and proximal esophageal biopsies    CARDIAC SURGERY     CATARACT EXTRACTION     COLONOSCOPY N/A 06/27/2019   [name]:Diverticulosis in the entire examined colon. tubular adenoma in ascending colon, 41mm tubular adenoma in prox sigmoid. external hemorrhoids   CORONARY ARTERY BYPASS GRAFT N/A 03/25/2019   Procedure: CORONARY ARTERY BYPASS GRAFTING (CABG) X  4 USING LEFT INTERNAL MAMMARY ARTERY AND RIGHT SAPHENOUS VEIN GRAFTS;  Surgeon: [name], MD;  Location: [place];  Service: Open Heart Surgery;  Laterality: N/A;   ESOPHAGOGASTRODUODENOSCOPY (EGD) WITH PROPOFOL N/A 10/21/2020   rehman:Normal hypopharynx.Normal proximal esophagus and mid esophagus.Esophageal mucosal changes consistent with long-segment  Barrett's esophagus. (Focal low-grade dysplasia and atypia proximally) z line irregular 37 cm from incisors, 3 cm HH, single gastric polyp (fundic gland) normal duodenal bulb/second portion of duodenum, proximal margin of Barrett's at 34 cm   ESOPHAGOGASTRODUODENOSCOPY (EGD) WITH PROPOFOL N/A 02/24/2021   Procedure: ESOPHAGOGASTRODUODENOSCOPY (EGD)  WITH PROPOFOL;  Surgeon: Rogene Houston, MD;  [facility];  Service: Endoscopy;  Laterality: N/A;  1:35   ESOPHAGOGASTRODUODENOSCOPY (EGD) WITH PROPOFOL N/A 03/21/2021   Procedure: ESOPHAGOGASTRODUODENOSCOPY (EGD) WITH PROPOFOL;  Surgeon: Carol Ada, MD;  Location: [place];  Service: Endoscopy;  Laterality: N/A;   EXCISION MORTON'S NEUROMA     EYE SURGERY Left    retina   INCISION AND DRAINAGE OF WOUND Right 06/28/2021   Procedure: IRRIGATION AND DEBRIDEMENT WOUND;  Surgeon: [name], MD;  Location: [place];  Service: Plastics;  Laterality: Right;  1.5 hour   POLYPECTOMY  06/27/2019   Procedure: POLYPECTOMY;  Surgeon: Rogene Houston, MD;  [facility];  Service: Endoscopy;;  proximal sigmoid colon   RIGHT/LEFT HEART CATH AND CORONARY ANGIOGRAPHY N/A 03/12/2019   Procedure: RIGHT/LEFT HEART CATH AND CORONARY ANGIOGRAPHY;  Surgeon: [name], MD;  Location: [place] [facility];  Service: Cardiovascular;  Laterality: N/A;   SHOULDER ARTHROSCOPY WITH ROTATOR CUFF REPAIR AND SUBACROMIAL DECOMPRESSION Right 08/26/2020   Procedure: RIGHT SHOULDER MINI OPEN ROTATOR CUFF REPAIR AND SUBACROMIAL DECOMPRESSION WITH PATCH GRAFT;  Surgeon: Susa Day, MD;  Location: [facility];  Service: Orthopedics;  Laterality: Right;  90 MINS GENERAL WITH BLOCK   SKIN SPLIT GRAFT Right 06/28/2021   Procedure: SKIN GRAFT SPLIT THICKNESS;  Surgeon: [name], MD;  Location: [place];  Service: Plastics;  Laterality: Right;   TEE WITHOUT CARDIOVERSION N/A 03/25/2019   Procedure: TRANSESOPHAGEAL ECHOCARDIOGRAM (TEE);  Surgeon: [name], MD;  Location: [place];  Service: Open Heart Surgery;  Laterality: N/A;   Social History:  reports that he quit smoking about 33 years ago. His smoking use included cigarettes. He has never used smokeless tobacco. He reports that he does not currently use alcohol after a past usage of about 2.0 standard drinks per week. He reports that  he does not use drugs.  Allergies  Allergen Reactions   Bee Venom Anaphylaxis   Atorvastatin     myalgia   Diltiazem Itching   Rosuvastatin     myalgias   Valsartan Itching    Family History  Problem Relation Age of Onset   Colon cancer Mother    Heart Problems Father    Diabetes Father    Valvular heart disease Father    Sleep apnea Neg Hx    Stroke Neg Hx     Prior to Admission medications   Medication Sig Start Date End Date Taking? Authorizing Provider  acetaminophen (TYLENOL) 500 MG tablet Take 1,000 mg by mouth every 6 (six) hours as needed for moderate pain or headache.     [provider]  albuterol (VENTOLIN HFA) 108 (90 Base) MCG/ACT inhaler Inhale 2 puffs into the lungs every 6 (six) hours as needed. Patient taking differently: Inhale 2 puffs into the lungs every 6 (six) hours as needed for wheezing or shortness of breath. 06/24/21   Coral Spikes, DO  aspirin EC 81 MG tablet Take 324 mg by mouth daily. Swallow whole. 02/26/21   Rogene Houston, MD  carvedilol (COREG) 12.5 MG tablet Take 1 tablet (12.5 mg total) by mouth 2 (two) times daily. 02/05/21  Loel Dubonnet, NP  celecoxib (CELEBREX) 200 MG capsule Take 1 capsule (200 mg total) by mouth 2 (two) times daily. Patient not taking: Reported on 07/21/2021 06/24/21   [name], MD  diphenhydrAMINE-APAP, sleep, (TYLENOL PM EXTRA STRENGTH PO) Take 2 tablets by mouth at bedtime as needed (sleep).    [provider]  doxazosin (CARDURA) 4 MG tablet Take 4 mg by mouth daily.    [provider]  EPINEPHrine 0.3 mg/0.3 mL IJ SOAJ injection Inject 0.3 mg into the muscle as needed for anaphylaxis. 01/25/21   [name], MD  furosemide (LASIX) 40 MG tablet Take 40 mg by mouth daily. Patient taking differently: Take 40 mg by mouth daily. 02/25/21   [name], MD  Ibuprofen-diphenhydrAMINE Cit (MOTRIN PM PO) Take 2 tablets by mouth at bedtime as needed (sleep/pain).    [provider]  Insulin Pen Needle (B-D ULTRAFINE III SHORT PEN) 31G X 8 MM MISC 1 each by Does not apply route as directed. 05/30/19   Cassandria Anger, MD  isosorbide-hydrALAZINE (BIDIL) 20-37.5 MG tablet Take 1 tablet by mouth in the morning and at bedtime. 02/23/21   Skeet Latch, MD  metoCLOPramide (REGLAN) 10 MG tablet Take 1 tablet (10 mg total) by mouth 3 (three) times daily before meals. 07/15/21   Rogene Houston, MD  Multiple Vitamin (MULTIVITAMIN WITH MINERALS) TABS tablet Take 1 tablet by mouth daily.    [provider]  Ondansetron 4 MG FILM Take 4 mg by mouth 3 (three) times daily as needed. Patient taking differently: Take 4 mg by mouth 3 (three) times daily as needed (nausea/vomiting). 07/15/21   Rogene Houston, MD  OZEMPIC, 1 MG/DOSE, 4 MG/3ML SOPN Inject 1 mg into the skin every Sunday. 01/22/21   [provider]  pantoprazole (PROTONIX) 40 MG tablet Take 1 tablet (40 mg total) by mouth 2 (two) times daily before a meal. 03/23/21   Thurnell Lose, MD  REPATHA SURECLICK 517 MG/ML SOAJ Inject 140 mg as directed every 14 (fourteen) days. 06/01/21   [provider]  sertraline (ZOLOFT) 100 MG tablet TAKE 1 TABLET BY MOUTH DAILY Patient taking differently: Take 100 mg by mouth daily. 04/09/21   [name], MD  traMADol (ULTRAM) 50 MG tablet Take 25 mg by mouth every 6 (six) hours as needed for moderate pain.    [provider]  VITAMIN D PO Take 1,000 Units by mouth daily.    [provider]    Physical Exam: Vitals:   07/24/21 2300 07/25/21 0000 07/25/21 0100 07/25/21 0249  BP: (!) 174/103 (!) 175/95 (!) 165/91 (!) 170/98  Pulse: 93 96 94 (!) 105  Resp: 20 (!) 21 (!) 21 18  Temp:    98 F (36.7 C)  TempSrc:    Oral  SpO2: 97% 100% 98% 99%  Weight:    95.1 kg  Height:    5\' 10"  (1.778 m)   General: 66 year old obese male. Awake and alert and oriented x3. Not in any acute distress.  HEENT: NCAT.  PERRLA. EOMI.  Sclerae anicteric.  Moist mucosal membranes. Neck: Neck supple without lymphadenopathy. No carotid bruits. No masses palpated.  Cardiovascular: Regular rate with normal S1-S2 sounds. No murmurs, rubs or gallops auscultated. No JVD.  Respiratory: Tachypnea, bilateral Rales in lower lobes.   No accessory muscle use. Abdomen: Soft, nontender, nondistended. Active bowel sounds. No masses or hepatosplenomegaly  Skin: Graft site on right thigh noted.  Dry, warm to touch. Musculoskeletal: +2 bilateral pedal edema noted.  Right lower extremity with dressing over the lower leg, clean and noninfected graft site noted on right thigh.   2+ dorsalis pedis and radial pulses. Good ROM.  No contractures  Psychiatric: Intact judgment and insight.  Mood appropriate to current condition. Neurologic: No focal neurological deficits. Strength is 5/5 x 4.  CN II - XII grossly intact.   Data Reviewed: Sinus tachycardia at a rate of 107 bpm  Assessment and Plan: * Acute on chronic combined systolic and diastolic CHF (congestive heart failure) ([place])- (present on admission) Lasix  Obstructive sleep apnea Bipap  Hyperglycemia due to diabetes mellitus (HCC) ISS  Elevated brain natriuretic peptide (BNP) level Rule out CHF  CKD (chronic kidney disease) stage 4, GFR 15-29 ml/min (HCC)- (present on admission) Creatinine at baseline  Coronary artery disease involving coronary bypass graft of native heart with other forms of angina pectoris ([place])- (present on admission) Continue home meds  Elevated troponin- (present on admission) Reactive process  Essential hypertension, benign- (present on admission) Lasix    Acute on chronic combined systolic and diastolic CHF Elevated BNP- > 4,500 Continue total input/output, daily weights and fluid restriction Continue IV Lasix 40 twice daily Continue aspirin, Coreg, Bidil Continue Cardiac diet  EKG without any acute changes Echocardiogram done in August 2022  showed LVEF of 45 to 50%.  LV with mildly decreased function.  LV with  RWMA.  LV diastolic parameters G1 DD.  Echocardiogram will be done in the morning   Elevated troponin possibly due to reactive process Troponin x2-36 > 39; patient denies chest pain, continue to trend troponin Consider cardiology consult based on repeated echo findings  Hyperglycemia secondary to poorly controlled T2DM CBG 229; continue ISS and hypoglycemic protocol Hemoglobin A1c  7 months ago was 6.5 Hold Ozempic, Repatha.  CAD Continue aspirin, Coreg, Bidil  Essential hypertension Continue Coreg, BiDil  CKD stage IV BUN/creatinine 46/3.80 (creatinine within baseline range) Renally adjust medications, avoid nephrotoxic agents/dehydration/hypotension  Obstructive sleep apnea Patient refused offered as well as home BiPAP   Advance Care Planning:   Code Status: Full Code   Consults: None  Family Communication: Wife at bedside (all questions answered to satisfaction)  Severity of Illness: The appropriate patient status for this patient is INPATIENT. Inpatient status is judged to be reasonable and necessary in order to provide the required intensity of service to ensure the patient's safety. The patient's presenting symptoms, physical exam findings, and initial radiographic and laboratory data in the context of their chronic comorbidities is felt to place them at high risk for further clinical deterioration. Furthermore, it is not anticipated that the patient will be medically stable for discharge from the hospital within 2 midnights of admission.   * I certify that at the point of admission it is my clinical judgment that the patient will require inpatient hospital care spanning beyond 2 midnights from the point of admission due to high intensity of service, high risk for further deterioration and high frequency of surveillance required.*  Author: Bernadette Hoit, DO 07/25/2021 5:21 AM  For on call review  [email or web address].

## 2021-07-25 NOTE — Assessment & Plan Note (Addendum)
Not on a statin due to intolerance.

## 2021-07-25 NOTE — Progress Notes (Signed)
°  Patient seen and examined personally, I reviewed the chart, history and physical and admission note, done by admitting physician this morning and agree with the same with following addendum.  Please refer to the morning admission note for more detailed plan of care.  Briefly,  66 y.o. male with medical history significant of malignant neoplasm of esophagus, essential hypertension, CAD, P5WS, combine systolic and diastolic heart failure, CKD stage IV, sleep apnea on BiPAP her last ED visit at [facility] 2/7 due to chest pain seen in the ED and discharged, presents W/ several weeks of worsening shortness of breath on lying flat.  In ED- tachypneic, BP was 174/103  Labw w/ anemia, BUN/creatinine 46/3.80 (creatinine within baseline range), CBG 229, BNP > 4500, troponin x2 - 36 > 39, magnesium 2.1, phosphorus 4.0.  Influenza A, B, SARS coronavirus 2 was negative.cxr-previous CABG.  Cardiomegaly,IV Lasix 60 mgx1 and admitted for chf exacerbation.   On exam patient endorses improving symptoms.   Assessment/plan Acute on chronic combined systolic and diastolic CHF (congestive heart failure) (HCC) Essential hypertension, benign Mixed hyperlipidemia Elevated troponin due to demand ischemia/CHF CAD S/P CABG CKD stage 4, 3.5-4S. Hyperglycemia due to diabetes mellitus  OSA   Plan: We will continue on IV Lasix with close monitoring of renal function, daily weight intake output.  Follow-up echocardiogram.  If persistent symptoms will involve cardiology in the morning Continue his home meds with Coreg BiDil aspirin. Rest of the issues refer to HPI

## 2021-07-25 NOTE — Assessment & Plan Note (Addendum)
No delta 40> 39, subtle elevation in the setting of CHF demand ischemia

## 2021-07-25 NOTE — Plan of Care (Signed)
  Problem: Education: Goal: Ability to demonstrate management of disease process will improve Outcome: Progressing Goal: Ability to verbalize understanding of medication therapies will improve Outcome: Progressing Goal: Individualized Educational Video(s) Outcome: Progressing   Problem: Cardiac: Goal: Ability to achieve and maintain adequate cardiopulmonary perfusion will improve Outcome: Progressing   

## 2021-07-26 DIAGNOSIS — I10 Essential (primary) hypertension: Secondary | ICD-10-CM

## 2021-07-26 DIAGNOSIS — I5043 Acute on chronic combined systolic (congestive) and diastolic (congestive) heart failure: Secondary | ICD-10-CM

## 2021-07-26 DIAGNOSIS — N184 Chronic kidney disease, stage 4 (severe): Secondary | ICD-10-CM

## 2021-07-26 LAB — GLUCOSE, CAPILLARY
Glucose-Capillary: 119 mg/dL — ABNORMAL HIGH (ref 70–99)
Glucose-Capillary: 225 mg/dL — ABNORMAL HIGH (ref 70–99)
Glucose-Capillary: 173 mg/dL — ABNORMAL HIGH (ref 70–99)
Glucose-Capillary: 193 mg/dL — ABNORMAL HIGH (ref 70–99)

## 2021-07-26 LAB — BASIC METABOLIC PANEL
Anion gap: 11 (ref 5–15)
BUN: 44 mg/dL — ABNORMAL HIGH (ref 8–23)
CO2: 18 mmol/L — ABNORMAL LOW (ref 22–32)
Calcium: 8.3 mg/dL — ABNORMAL LOW (ref 8.9–10.3)
Chloride: 109 mmol/L (ref 98–111)
Creatinine, Ser: 3.73 mg/dL — ABNORMAL HIGH (ref 0.61–1.24)
GFR, Estimated: 17 mL/min — ABNORMAL LOW (ref >60)
Glucose, Bld: 134 mg/dL — ABNORMAL HIGH (ref 70–99)
Potassium: 3.7 mmol/L (ref 3.5–5.1)
Sodium: 138 mmol/L (ref 135–145)

## 2021-07-26 LAB — CBC
HCT: 26.9 % — ABNORMAL LOW (ref 39.0–52.0)
Hemoglobin: 8.4 g/dL — ABNORMAL LOW (ref 13.0–17.0)
MCH: 28.1 pg (ref 26.0–34.0)
MCHC: 31.2 g/dL (ref 30.0–36.0)
MCV: 90 fL (ref 80.0–100.0)
Platelets: 211 10*3/uL (ref 150–400)
RBC: 2.99 MIL/uL — ABNORMAL LOW (ref 4.22–5.81)
RDW: 17.4 % — ABNORMAL HIGH (ref 11.5–15.5)
WBC: 5.2 10*3/uL (ref 4.0–10.5)
nRBC: 0 % (ref 0.0–0.2)

## 2021-07-26 LAB — BRAIN NATRIURETIC PEPTIDE: B Natriuretic Peptide: 3538 pg/mL — ABNORMAL HIGH (ref 0.0–100.0)

## 2021-07-26 MED ORDER — MELATONIN 3 MG PO TABS
6.0000 mg | ORAL_TABLET | Freq: Once | ORAL | Status: AC
  Administered 2021-07-26: 6 mg via ORAL
  Filled 2021-07-26: qty 2

## 2021-07-26 NOTE — Progress Notes (Deleted)
Patient is a 66 year old male PMH of right lower extremity sustained 06/08/2021 s/p irrigation and debridement as well as STSG placement performed 06/28/2021 by Dr. Claudia Desanctis who presents to clinic for postoperative follow-up.  He was seen most recently here in clinic 07/06/2021.  At that time, bolster was removed and the graft appeared to be taking well.  Plan was for him to return in a few weeks to take out the remainder of his staples.  Patient was then admitted to the hospital 07/24/2021 for CHF exacerbation with BNP noted to be greater than 4500.

## 2021-07-26 NOTE — Consult Note (Signed)
Cardiology Consultation:   Patient ID: [name] MRN: [id number]; DOB: 06-10-1956  Admit date: 07/24/2021 Date of Consult: 07/26/2021  PCP:  [name], MD   [facility] Providers Cardiologist:  Skeet Latch, MD        Patient Profile:   [name] is a 66 y.o. male with a hx of CAD, chronic combined systolic/diasotlic HF, CKD IV, who is being seen 07/26/2021 for the evaluation of SOB at the request of Dr Lupita Leash.  History of Present Illness:   [name] 66 yo male history of CAD with prior CABG 4 vessel 03/2019, chronic combined systolic/diasotlic HF, HTN, HL, PAD, CKD 4, psvt, NSVT, OSA on bipap, admitted with SOB, leg edema. He reports compliance with diuretic at home.    05/2021 clinic wt 199 lbs  WBC 6.3 Hgb 9.6 Plt 238 K 3.7 BUN 46 Cr 3.8 (basleine 3.5 to 4) BNP >4500 COVID neg flu neg Trop 36-->39-->40(chronically mildly elevated) CXR no acute process EKG SR  01/2021 echo: LVEF 45-50%, grade I dd, normal RV 02/2021 nuclear stress: inferior/inferolateral infarct, minimal peri infarct ischemia.  07/2021 echo: LVEF 29-51%, indet diastolic, mod RV dysfunction, moderate PASP 57     Past Medical History:  Diagnosis Date   Anemia    Arthritis    hips shoulders   CAD (coronary artery disease) 03/03/2019   Cancer ([place])    Esophageal cancer 2022   CHF (congestive heart failure) ([place]) 2020   Chronic combined systolic and diastolic heart failure ([place]) 04/26/2018   Admitted 11/14-11/20/19-diuresed 10L   Depression 05/31/2021   Fatigue 10/14/2020   Hypertension    Myocardial infarction [facility])    Sleep apnea    uses a bipap machine   Stroke ([place]) 12/14/2020   no last weakness or paralysis   Type 2 diabetes mellitus with diabetic nephropathy ([place]) 04/23/2019   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    Past Surgical History:  Procedure Laterality Date   BIOPSY  06/27/2019   Procedure: BIOPSY;  Surgeon: Rogene Houston, MD;  [facility];  Service: Endoscopy;;  ascending colon polyp   BIOPSY  10/21/2020   Procedure: BIOPSY;  Surgeon: Rogene Houston, MD;  [facility];  Service: Endoscopy;;  esophageal,gastric polyp   BIOPSY  02/24/2021   Procedure: BIOPSY;  Surgeon: Rogene Houston, MD;  [facility];  Service: Endoscopy;;  distal and proximal esophageal biopsies    CARDIAC SURGERY     CATARACT EXTRACTION     COLONOSCOPY N/A 06/27/2019   [name]:Diverticulosis in the entire examined colon. tubular adenoma in ascending colon, 17mm tubular adenoma in prox sigmoid. external hemorrhoids   CORONARY ARTERY BYPASS GRAFT N/A 03/25/2019   Procedure: CORONARY ARTERY BYPASS GRAFTING (CABG) X  4 USING LEFT INTERNAL MAMMARY ARTERY AND RIGHT SAPHENOUS VEIN GRAFTS;  Surgeon: [name], MD;  Location: [place];  Service: Open Heart Surgery;  Laterality: N/A;   ESOPHAGOGASTRODUODENOSCOPY (EGD) WITH PROPOFOL N/A 10/21/2020   rehman:Normal hypopharynx.Normal proximal esophagus and mid esophagus.Esophageal mucosal changes consistent with long-segment Barrett's esophagus. (Focal low-grade dysplasia and atypia proximally) z line irregular 37 cm from incisors, 3 cm HH, single gastric polyp (fundic gland) normal duodenal bulb/second portion of duodenum, proximal margin of Barrett's at 34 cm   ESOPHAGOGASTRODUODENOSCOPY (EGD) WITH PROPOFOL N/A 02/24/2021   Procedure: ESOPHAGOGASTRODUODENOSCOPY (EGD) WITH PROPOFOL;  Surgeon: Rogene Houston, MD;  [facility];  Service: Endoscopy;  Laterality: N/A;  1:35  ESOPHAGOGASTRODUODENOSCOPY (EGD) WITH PROPOFOL N/A 03/21/2021   Procedure: ESOPHAGOGASTRODUODENOSCOPY (EGD) WITH PROPOFOL;  Surgeon: Carol Ada, MD;  Location: [place];  Service: Endoscopy;  Laterality: N/A;   EXCISION MORTON'S NEUROMA     EYE SURGERY Left    retina   INCISION AND DRAINAGE OF WOUND Right 06/28/2021   Procedure: IRRIGATION AND DEBRIDEMENT WOUND;  Surgeon: [name], MD;   Location: [place];  Service: Plastics;  Laterality: Right;  1.5 hour   POLYPECTOMY  06/27/2019   Procedure: POLYPECTOMY;  Surgeon: Rogene Houston, MD;  [facility];  Service: Endoscopy;;  proximal sigmoid colon   RIGHT/LEFT HEART CATH AND CORONARY ANGIOGRAPHY N/A 03/12/2019   Procedure: RIGHT/LEFT HEART CATH AND CORONARY ANGIOGRAPHY;  Surgeon: [name], MD;  Location: [place] [facility];  Service: Cardiovascular;  Laterality: N/A;   SHOULDER ARTHROSCOPY WITH ROTATOR CUFF REPAIR AND SUBACROMIAL DECOMPRESSION Right 08/26/2020   Procedure: RIGHT SHOULDER MINI OPEN ROTATOR CUFF REPAIR AND SUBACROMIAL DECOMPRESSION WITH PATCH GRAFT;  Surgeon: Susa Day, MD;  Location: [facility];  Service: Orthopedics;  Laterality: Right;  90 MINS GENERAL WITH BLOCK   SKIN SPLIT GRAFT Right 06/28/2021   Procedure: SKIN GRAFT SPLIT THICKNESS;  Surgeon: [name], MD;  Location: [place] ;  Service: Plastics;  Laterality: Right;   TEE WITHOUT CARDIOVERSION N/A 03/25/2019   Procedure: TRANSESOPHAGEAL ECHOCARDIOGRAM (TEE);  Surgeon: [name], MD;  Location: [place];  Service: Open Heart Surgery;  Laterality: N/A;       Inpatient Medications: Scheduled Meds:  aspirin EC  324 mg Oral Daily   carvedilol  12.5 mg Oral BID   doxazosin  4 mg Oral Daily   enoxaparin (LOVENOX) injection  30 mg Subcutaneous Q24H   furosemide  40 mg Intravenous Q12H   insulin aspart  0-5 Units Subcutaneous QHS   insulin aspart  0-9 Units Subcutaneous TID WC   isosorbide-hydrALAZINE  1 tablet Oral BID   metoCLOPramide  10 mg Oral TID AC   multivitamin with minerals  1 tablet Oral Daily   pantoprazole  40 mg Oral Daily   potassium chloride  10 mEq Oral Daily   sertraline  100 mg Oral Daily   Continuous Infusions:  PRN Meds: albuterol, guaiFENesin, traMADol  Allergies:    Allergies  Allergen Reactions   Bee Venom Anaphylaxis   Atorvastatin     myalgia   Diltiazem Itching   Rosuvastatin      myalgias   Valsartan Itching    Social History:   Social History   Socioeconomic History   Marital status: Single    Spouse name: Not on file   Number of children: Not on file   Years of education: Not on file   Highest education level: Not on file  Occupational History   Not on file  Tobacco Use   Smoking status: Former    Types: Cigarettes    Quit date: 07/06/1988    Years since quitting: 33.0   Smokeless tobacco: Never  Vaping Use   Vaping Use: Never used  Substance and Sexual Activity   Alcohol use: Not Currently    Alcohol/week: 2.0 standard drinks    Types: 2 Glasses of wine per week    Comment: socially   Drug use: No   Sexual activity: Not on file  Other Topics Concern   Not on file  Social History Narrative   Not on file   Social Determinants of Health   Financial Resource Strain: Not  on file  Food Insecurity: Not on file  Transportation Needs: Not on file  Physical Activity: Not on file  Stress: Not on file  Social Connections: Not on file  Intimate Partner Violence: Not on file    Family History:    Family History  Problem Relation Age of Onset   Colon cancer Mother    Heart Problems Father    Diabetes Father    Valvular heart disease Father    Sleep apnea Neg Hx    Stroke Neg Hx      ROS:  Please see the history of present illness.   All other ROS reviewed and negative.     Physical Exam/Data:   Vitals:   07/25/21 1639 07/25/21 2108 07/25/21 2300 07/26/21 0538  BP: (!) 154/109 (!) 162/109 (!) 156/102 (!) 157/88  Pulse: 83 83 81 82  Resp: 19 18  12   Temp: (!) 97.4 F (36.3 C) 97.9 F (36.6 C) 97.9 F (36.6 C) 97.9 F (36.6 C)  TempSrc: Oral Oral Oral Oral  SpO2: 100% 99% 93% 96%  Weight:    93.7 kg  Height:        Intake/Output Summary (Last 24 hours) at 07/26/2021 0929 Last data filed at 07/26/2021 0900 Gross per 24 hour  Intake 1160 ml  Output 1150 ml  Net 10 ml   Last 3 Weights 07/26/2021 07/25/2021 07/24/2021  Weight  (lbs) 206 lb 9.1 oz 209 lb 9.6 oz 200 lb  Weight (kg) 93.7 kg 95.074 kg 90.719 kg     Body mass index is 29.64 kg/m.  General:  Well nourished, well developed, in no acute distress HEENT: normal Neck: elevated JVD Vascular: No carotid bruits; Distal pulses 2+ bilaterally Cardiac:  normal S1, S2; RRR; no murmur  Lungs: faint crackles bilateral bases Abd: soft, nontender, no hepatomegaly  Ext: no edema Musculoskeletal:  No deformities, BUE and BLE strength normal and equal Skin: warm and dry  Neuro:  CNs 2-12 intact, no focal abnormalities noted Psych:  Normal affect     Laboratory Data:  High Sensitivity Troponin:   Recent Labs  Lab 07/20/21 1803 07/24/21 2338 07/25/21 0231 07/25/21 0538 07/25/21 0714  TROPONINIHS 44* 36* 39* 40* 39*     Chemistry Recent Labs  Lab 07/24/21 2338 07/25/21 0231 07/26/21 0449  NA 139 139 138  K 3.7 3.4* 3.7  CL 110 110 109  CO2 20* 20* 18*  GLUCOSE 229* 173* 134*  BUN 46* 45* 44*  CREATININE 3.80* 3.71* 3.73*  CALCIUM 8.4* 8.3* 8.3*  MG  --  2.1  --   GFRNONAA 17* 17* 17*  ANIONGAP 9 9 11     Recent Labs  Lab 07/20/21 1230 07/24/21 2338 07/25/21 0231  PROT 6.2* 6.6 6.0*  ALBUMIN 3.3* 3.5 3.3*  AST 15 13* 11*  ALT 17 14 15   ALKPHOS 68 85 80  BILITOT 0.5 0.6 0.4   Lipids No results for input(s): CHOL, TRIG, HDL, LABVLDL, LDLCALC, CHOLHDL in the last 168 hours.  Hematology Recent Labs  Lab 07/24/21 2338 07/25/21 0231 07/26/21 0449  WBC 6.3 6.8 5.2  RBC 3.48* 3.29* 2.99*  HGB 9.6* 8.8* 8.4*  HCT 31.6* 29.6* 26.9*  MCV 90.8 90.0 90.0  MCH 27.6 26.7 28.1  MCHC 30.4 29.7* 31.2  RDW 17.7* 17.3* 17.4*  PLT 238 234 211   Thyroid No results for input(s): TSH, FREET4 in the last 168 hours.  BNP Recent Labs  Lab 07/24/21 2338 07/26/21 0449  BNP >4,500.0* 3,538.0*  DDimer No results for input(s): DDIMER in the last 168 hours.   Radiology/Studies:  DG Chest Port 1 View  Result Date: 07/24/2021 CLINICAL DATA:   Shortness of breath and weakness over the last several weeks. EXAM: PORTABLE CHEST 1 VIEW COMPARISON:  07/20/2021 FINDINGS: Artifact overlies the chest. Previous median sternotomy and CABG. Mild cardiomegaly. No evidence of heart failure or effusion. The lungs are clear, allowing for a slightly suboptimal inspiration. IMPRESSION: Previous CABG.  Cardiomegaly.  No active disease suspected. Electronically Signed   By: Nelson Chimes M.D.   On: 07/24/2021 23:21   ECHOCARDIOGRAM COMPLETE  Result Date: 07/25/2021    ECHOCARDIOGRAM REPORT   Patient Name:   MORSE BRUEGGEMANN Date of Exam: 07/25/2021 Medical Rec #:  [id number]        Height:       70.0 in Accession #:    [id number]       Weight:       209.6 lb Date of Birth:  [birth year]        BSA:          2.129 m Patient Age:    64 years         BP:           169/114 mmHg Patient Gender: M                HR:           87 bpm. Exam Location:  Inpatient Procedure: 2D Echo, Cardiac Doppler, Color Doppler and Intracardiac            Opacification Agent Indications:    CHF-Acute Systolic I95.18  History:        Patient has prior history of Echocardiogram examinations, most                 recent 02/04/2021. CHF, CAD and Previous Myocardial Infarction,                 Stroke; Risk Factors:Hypertension, Sleep Apnea and Diabetes.                 History of cancer.  Sonographer:    Darlina Sicilian RDCS Referring Phys: [id number] OLADAPO ADEFESO IMPRESSIONS  1. Left ventricular ejection fraction, by estimation, is 40 to 45%. The left ventricle has mildly decreased function. The left ventricle demonstrates regional wall motion abnormalities (see scoring diagram/findings for description). There is mild concentric left ventricular hypertrophy. Left ventricular diastolic function could not be evaluated. There is akinesis of the left ventricular, basal inferior wall and inferoseptal wall.  2. Right ventricular systolic function is moderately reduced. The right ventricular size is severely  enlarged. There is moderately elevated pulmonary artery systolic pressure. The estimated right ventricular systolic pressure is 30.1 mmHg.  3. Left atrial size was moderately dilated.  4. The mitral valve is degenerative. Mild to moderate mitral valve regurgitation. No evidence of mitral stenosis.  5. The aortic valve is normal in structure. Aortic valve regurgitation is not visualized. No aortic stenosis is present.  6. Aortic dilatation noted. There is mild dilatation of the aortic root, measuring 39 mm.  7. The inferior vena cava is dilated in size with <50% respiratory variability, suggesting right atrial pressure of 15 mmHg. FINDINGS  Left Ventricle: Left ventricular ejection fraction, by estimation, is 40 to 45%. The left ventricle has mildly decreased function. The left ventricle demonstrates regional wall motion abnormalities. Definity contrast agent was given IV to delineate the left ventricular endocardial borders. The  left ventricular internal cavity size was normal in size. There is mild concentric left ventricular hypertrophy. Left ventricular diastolic function could not be evaluated. Right Ventricle: The right ventricular size is severely enlarged. No increase in right ventricular wall thickness. Right ventricular systolic function is moderately reduced. There is moderately elevated pulmonary artery systolic pressure. The tricuspid regurgitant velocity is 3.22 m/s, and with an assumed right atrial pressure of 15 mmHg, the estimated right ventricular systolic pressure is 09.2 mmHg. Left Atrium: Left atrial size was moderately dilated. Right Atrium: Right atrial size was normal in size. Pericardium: There is no evidence of pericardial effusion. Mitral Valve: The mitral valve is degenerative in appearance. There is mild calcification of the mitral valve leaflet(s). Mild to moderate mitral annular calcification. Mild to moderate mitral valve regurgitation. No evidence of mitral valve stenosis. Tricuspid  Valve: The tricuspid valve is normal in structure. Tricuspid valve regurgitation is mild . No evidence of tricuspid stenosis. Aortic Valve: The aortic valve is normal in structure. Aortic valve regurgitation is not visualized. No aortic stenosis is present. Pulmonic Valve: The pulmonic valve was normal in structure. Pulmonic valve regurgitation is mild. No evidence of pulmonic stenosis. Aorta: Aortic dilatation noted. There is mild dilatation of the aortic root, measuring 39 mm. Venous: The inferior vena cava is dilated in size with less than 50% respiratory variability, suggesting right atrial pressure of 15 mmHg. IAS/Shunts: No atrial level shunt detected by color flow Doppler.  LEFT VENTRICLE PLAX 2D LVIDd:         5.40 cm      Diastology LVIDs:         4.60 cm      LV e' medial:    9.07 cm/s LV PW:         1.20 cm      LV E/e' medial:  9.9 LV IVS:        1.20 cm      LV e' lateral:   12.85 cm/s LVOT diam:     2.10 cm      LV E/e' lateral: 7.0 LV SV:         53 LV SV Index:   25 LVOT Area:     3.46 cm  LV Volumes (MOD) LV vol d, MOD A2C: 187.0 ml LV vol d, MOD A4C: 214.0 ml LV vol s, MOD A2C: 130.0 ml LV vol s, MOD A4C: 123.0 ml LV SV MOD A2C:     57.0 ml LV SV MOD A4C:     214.0 ml LV SV MOD BP:      74.7 ml RIGHT VENTRICLE RV S prime:     5.48 cm/s TAPSE (M-mode): 0.9 cm LEFT ATRIUM             Index        RIGHT ATRIUM           Index LA diam:        5.60 cm 2.63 cm/m   RA Area:     19.80 cm LA Vol (A2C):   99.2 ml 46.59 ml/m  RA Volume:   52.10 ml  24.47 ml/m LA Vol (A4C):   90.1 ml 42.31 ml/m LA Biplane Vol: 97.0 ml 45.55 ml/m  AORTIC VALVE LVOT Vmax:   82.20 cm/s LVOT Vmean:  56.000 cm/s LVOT VTI:    0.153 m  AORTA Ao Root diam: 3.90 cm Ao Asc diam:  3.60 cm MITRAL VALVE  TRICUSPID VALVE MV Area (PHT): 4.96 cm    TR Peak grad:   41.5 mmHg MV Decel Time: 153 msec    TR Vmax:        322.00 cm/s MR Peak grad: 89.1 mmHg MR Mean grad: 61.0 mmHg    SHUNTS MR Vmax:      472.00 cm/s  Systemic  VTI:  0.15 m MR Vmean:     370.0 cm/s   Systemic Diam: 2.10 cm MR PISA:      1.62 cm MV E velocity: 89.55 cm/s Fransico Him MD Electronically signed by Fransico Him MD Signature Date/Time: 07/25/2021/3:20:00 PM    Final      Assessment and Plan:   1.Acute on chronic combined systolic/diastolic HF - 11/2834 echo: LVEF 45-50%, grade I dd, normal RV - 07/2021 echo: LVEF 62-94%, indet diastolic, mod RV dysfunction, moderate PASP 57 - CXR no acute process, BNP >4500 - Dec clinic wt 199, unclear reliability of hospital weights, ?209 lbs on admissoin. Today weight down to 206 lbs.   - incomplete I/Os. He is on IV lasix 40mg  bid - medical therapy has been limited by side effects, renal dysfunction. He is coreg 12.5mg  bid, bidil bid.  - was on oral lasix 40mg  bid at home, likely change to torsemide at discharge.   - continue IV lasix today.     2. HTN - followed in HTN clinic - difficult management due to medication side effects, - From notes has not tolerated norvasc, lisinopril, HCTZ, or beta blockers due to fatigue (though currently on coreg). Orthostatic bp's on higher dose doxazosin - follow bp's here with diuresis, will not adjust home regimen at this time.      3. CKD IV - basleine Cr 3.5 to 4  Risk Assessment/Risk Scores:        New York Heart Association (NYHA) Functional Class NYHA Class III        For questions or updates, please contact [facility] Please consult [email or web address] for contact info under    Signed, Carlyle Dolly, MD  07/26/2021 9:29 AM
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
## 2021-07-26 NOTE — Assessment & Plan Note (Addendum)
Hemoglobin stable.monitor Recent Labs  Lab 07/20/21 1230 07/20/21 1304 07/24/21 2338 07/25/21 0231 07/26/21 0449  HGB 9.0* 9.9* 9.6* 8.8* 8.4*  HCT 30.4* 29.0* 31.6* 29.6* 26.9*

## 2021-07-26 NOTE — Progress Notes (Signed)
PROGRESS NOTE [name]  MPN:[id number] DOB: [birth year] DOA: 07/24/2021 PCP: [name], MD   Brief Narrative/Hospital Course: [name], 66 y.o. male 66 y.o. male with medical history significant of malignant neoplasm of esophagus, essential hypertension, CAD, M0QQ, combine systolic and diastolic heart failure, CKD stage IV, sleep apnea on BiPAP her last ED visit at [facility] 2/7 due to chest pain seen in the ED and discharged, presents W/ several weeks of worsening shortness of breath on lying flat.  In ED- tachypneic, BP was 174/103  Labw w/ anemia, BUN/creatinine 46/3.80 (creatinine within baseline range), CBG 229, BNP > 4500, troponin x2 - 36 > 39, magnesium 2.1, phosphorus 4.0.  Influenza A, B, SARS coronavirus 2 was negative.cxr-previous CABG.  Cardiomegaly,IV Lasix 60 mgx1 and admitted for chf exacerbation. Patient had clinical improvement diuretics.  Renal function with CKD remained stable. Bnp >4500-- 3538 w/ diuresis.    Subjective: Seen and examined.  Patient reports he feels 50% better than when he came in.  No new complaints no chest pain. Overnight blood pressure on higher side, which appears to be up since admission but better since yesterday 200> 209> 206.5  Assessment and Plan: * Acute on chronic combined systolic and diastolic CHF (congestive heart failure) ([place])- (present on admission) lvef 40-45 % 07/25/21, slightly worse or may be similar lvef 45-50% 02/04/21. Clinically  Improving on iv lasix. Cont home regimen-Coreg, Cardura, BiDil. Monitor I/O, daily weight, keep on salt and fluid restriction.  Cardiology consulted. His previous weight on January 25 was 95.6 kg, on admit 90 wt> but now up- ??accuracy Filed Weights   07/24/21 2235 07/25/21 0249 07/26/21 0538  Weight: 90.7 kg 95.1 kg 93.7 kg   Net IO Since Admission: -1,160 mL [07/26/21 1200]  Wt Readings from Last 3 Encounters:  07/26/21 93.7 kg  07/07/21 95.6 kg  06/28/21 88.5 kg    Essential  hypertension, benign- (present on admission) Poorly controlled.  Resumed home Coreg 12.5 twice daily, Cardura 4 daily, BiDil.  Hopefully will improve with diuresis.  Will need follow-up with cardiology as outpatient to adjust blood pressure meds   Coronary artery disease involving coronary bypass graft of native heart with other forms of angina pectoris ([place])- (present on admission) Previous CABG 03/25/2019.  No chest pain.  Continue with aspirin Coreg.  Not on statin due to intolerance.  Elevated troponin- (present on admission) No delta 40> 39, subtle elevation in the setting of CHF demand ischemia  Mixed hyperlipidemia- (present on admission) Not on a statin due to intolerance.  Anemia due to chronic kidney disease Hemoglobin remains baseline as below.  Monitor Recent Labs  Lab 07/20/21 1230 07/20/21 1304 07/24/21 2338 07/25/21 0231 07/26/21 0449  HGB 9.0* 9.9* 9.6* 8.8* 8.4*  HCT 30.4* 29.0* 31.6* 29.6* 26.9*    Obstructive sleep apnea Cont CPAP  Hyperglycemia due to diabetes mellitus (HCC) Continue sliding scale insulin.  Blood sugars stable.  Last A1c 6.4. Recent Labs  Lab 07/25/21 0539 07/25/21 0739 07/25/21 1155 07/25/21 1705 07/25/21 2112 07/26/21 0739  GLUCAP  --  120* 204* 148* 152* 119*  HGBA1C 6.4*  --   --   --   --   --     Elevated brain natriuretic peptide (BNP) level Rule out CHF  CKD (chronic kidney disease) stage 4, GFR 15-29 ml/min (HCC)- (present on admission) Renal function at baseline- which is 3.5-4. Recent Labs  Lab 07/20/21 1230 07/20/21 1304 07/24/21 2338 07/25/21 0231 07/26/21 0449  BUN 31*  32* 46* 45* 44*  CREATININE 4.17* 4.60* 3.80* 3.71* 3.73*    DVT prophylaxis: enoxaparin (LOVENOX) injection 30 mg Start: 07/25/21 0700 SCDs Start: 07/25/21 0144 Code Status:   Code Status: Full Code Family Communication: plan of care discussed with patient at bedside. Disposition: Currently not medically stable for discharge. Status is:  Inpatient Remains inpatient appropriate because: For ongoing management of congestive heart failure Objective: Vitals last 24 hrs: Vitals:   07/25/21 1639 07/25/21 2108 07/25/21 2300 07/26/21 0538  BP: (!) 154/109 (!) 162/109 (!) 156/102 (!) 157/88  Pulse: 83 83 81 82  Resp: 19 18  12   Temp: (!) 97.4 F (36.3 C) 97.9 F (36.6 C) 97.9 F (36.6 C) 97.9 F (36.6 C)  TempSrc: Oral Oral Oral Oral  SpO2: 100% 99% 93% 96%  Weight:    93.7 kg  Height:       Weight change: 2.981 kg  Physical Examination: General exam: Aa0x3, pleasant,older than stated age, weak appearing. HEENT:Oral mucosa moist, Ear/Nose WNL grossly, dentition normal. Respiratory system: bilaterally clear BS, no use of accessory muscle Cardiovascular system: S1 & S2 +, No JVD,. Gastrointestinal system: Abdomen soft,NT,ND, BS+ Nervous System:Alert, awake, moving extremities and grossly nonfocal Extremities: LE edema mild,distal peripheral pulses palpable.  Skin: No rashes,no icterus. MSK: Normal muscle bulk,tone, power  Medications reviewed:  Scheduled Meds:  aspirin EC  324 mg Oral Daily   carvedilol  12.5 mg Oral BID   doxazosin  4 mg Oral Daily   enoxaparin (LOVENOX) injection  30 mg Subcutaneous Q24H   furosemide  40 mg Intravenous Q12H   insulin aspart  0-5 Units Subcutaneous QHS   insulin aspart  0-9 Units Subcutaneous TID WC   isosorbide-hydrALAZINE  1 tablet Oral BID   metoCLOPramide  10 mg Oral TID AC   multivitamin with minerals  1 tablet Oral Daily   pantoprazole  40 mg Oral Daily   potassium chloride  10 mEq Oral Daily   sertraline  100 mg Oral Daily   Continuous Infusions:    Diet Order             Diet heart healthy/carb modified Room service appropriate? Yes; Fluid consistency: Thin  Diet effective now                    Intake/Output Summary (Last 24 hours) at 07/26/2021 1201 Last data filed at 07/26/2021 1100 Gross per 24 hour  Intake 1160 ml  Output 1900 ml  Net -740 ml    Net IO Since Admission: -1,160 mL [07/26/21 1201]  Wt Readings from Last 3 Encounters:  07/26/21 93.7 kg  07/07/21 95.6 kg  06/28/21 88.5 kg     Unresulted Labs (From admission, onward)     Start     Ordered   08/01/21 0500  Creatinine, serum  (enoxaparin (LOVENOX)    CrCl < 30 ml/min)  Weekly,   R     Comments: while on enoxaparin therapy.    07/25/21 0143   07/26/21 8546  Basic metabolic panel  Daily,   R     Question:  Specimen collection method  Answer:  Lab=Lab collect   07/25/21 0754   07/26/21 0500  CBC  Daily,   R     Question:  Specimen collection method  Answer:  Lab=Lab collect   07/25/21 0754           Data Reviewed: I have personally reviewed following labs and imaging studies CBC: Recent Labs  Lab 07/20/21  1230 07/20/21 1304 07/24/21 2338 07/25/21 0231 07/26/21 0449  WBC 7.1  --  6.3 6.8 5.2  NEUTROABS 5.2  --  4.5  --   --   HGB 9.0* 9.9* 9.6* 8.8* 8.4*  HCT 30.4* 29.0* 31.6* 29.6* 26.9*  MCV 91.0  --  90.8 90.0 90.0  PLT 251  --  238 234 027   Basic Metabolic Panel: Recent Labs  Lab 07/20/21 1230 07/20/21 1304 07/24/21 2338 07/25/21 0231 07/26/21 0449  NA 139 141 139 139 138  K 4.0 3.9 3.7 3.4* 3.7  CL 111 112* 110 110 109  CO2 16*  --  20* 20* 18*  GLUCOSE 155* 151* 229* 173* 134*  BUN 31* 32* 46* 45* 44*  CREATININE 4.17* 4.60* 3.80* 3.71* 3.73*  CALCIUM 8.6*  --  8.4* 8.3* 8.3*  MG  --   --   --  2.1  --   PHOS  --   --   --  4.0  --    GFR: Estimated Creatinine Clearance: 23 mL/min (A) (by C-G formula based on SCr of 3.73 mg/dL (H)). Liver Function Tests: Recent Labs  Lab 07/20/21 1230 07/24/21 2338 07/25/21 0231  AST 15 13* 11*  ALT 17 14 15   ALKPHOS 68 85 80  BILITOT 0.5 0.6 0.4  PROT 6.2* 6.6 6.0*  ALBUMIN 3.3* 3.5 3.3*   No results for input(s): LIPASE, AMYLASE in the last 168 hours. No results for input(s): AMMONIA in the last 168 hours. Coagulation Profile: No results for input(s): INR, PROTIME in the last  168 hours. Cardiac Enzymes: No results for input(s): CKTOTAL, CKMB, CKMBINDEX, TROPONINI in the last 168 hours. BNP (last 3 results) No results for input(s): PROBNP in the last 8760 hours. HbA1C: Recent Labs    07/25/21 0539  HGBA1C 6.4*   CBG: Recent Labs  Lab 07/25/21 1155 07/25/21 1705 07/25/21 2112 07/26/21 0739 07/26/21 1103  GLUCAP 204* 148* 152* 119* 225*   Lipid Profile: No results for input(s): CHOL, HDL, LDLCALC, TRIG, CHOLHDL, LDLDIRECT in the last 72 hours. Thyroid Function Tests: No results for input(s): TSH, T4TOTAL, FREET4, T3FREE, THYROIDAB in the last 72 hours. Anemia Panel: No results for input(s): VITAMINB12, FOLATE, FERRITIN, TIBC, IRON, RETICCTPCT in the last 72 hours. Sepsis Labs: No results for input(s): PROCALCITON, LATICACIDVEN in the last 168 hours.  Antimicrobials: Anti-infectives (From admission, onward)    None      Culture/Microbiology Radiology Studies: DG Chest Port 1 View  Result Date: 07/24/2021 CLINICAL DATA:  Shortness of breath and weakness over the last several weeks. EXAM: PORTABLE CHEST 1 VIEW COMPARISON:  07/20/2021 FINDINGS: Artifact overlies the chest. Previous median sternotomy and CABG. Mild cardiomegaly. No evidence of heart failure or effusion. The lungs are clear, allowing for a slightly suboptimal inspiration. IMPRESSION: Previous CABG.  Cardiomegaly.  No active disease suspected. Electronically Signed   By: Nelson Chimes M.D.   On: 07/24/2021 23:21   ECHOCARDIOGRAM COMPLETE  Result Date: 07/25/2021    ECHOCARDIOGRAM REPORT   Patient Name:   [name] Date of Exam: 07/25/2021 Medical Rec #:  [id number]        Height:       70.0 in Accession #:    [id number]       Weight:       209.6 lb Date of Birth:  [birth year]        BSA:          2.129 m Patient Age:    86  years         BP:           169/114 mmHg Patient Gender: M                HR:           87 bpm. Exam Location:  Inpatient Procedure: 2D Echo, Cardiac Doppler,  Color Doppler and Intracardiac            Opacification Agent Indications:    CHF-Acute Systolic C12.75  History:        Patient has prior history of Echocardiogram examinations, most                 recent 02/04/2021. CHF, CAD and Previous Myocardial Infarction,                 Stroke; Risk Factors:Hypertension, Sleep Apnea and Diabetes.                 History of cancer.  Sonographer:    Darlina Sicilian RDCS Referring Phys: [id number] OLADAPO ADEFESO IMPRESSIONS  1. Left ventricular ejection fraction, by estimation, is 40 to 45%. The left ventricle has mildly decreased function. The left ventricle demonstrates regional wall motion abnormalities (see scoring diagram/findings for description). There is mild concentric left ventricular hypertrophy. Left ventricular diastolic function could not be evaluated. There is akinesis of the left ventricular, basal inferior wall and inferoseptal wall.  2. Right ventricular systolic function is moderately reduced. The right ventricular size is severely enlarged. There is moderately elevated pulmonary artery systolic pressure. The estimated right ventricular systolic pressure is 94.4 mmHg.  3. Left atrial size was moderately dilated.  4. The mitral valve is degenerative. Mild to moderate mitral valve regurgitation. No evidence of mitral stenosis.  5. The aortic valve is normal in structure. Aortic valve regurgitation is not visualized. No aortic stenosis is present.  6. Aortic dilatation noted. There is mild dilatation of the aortic root, measuring 39 mm.  7. The inferior vena cava is dilated in size with <50% respiratory variability, suggesting right atrial pressure of 15 mmHg. FINDINGS  Left Ventricle: Left ventricular ejection fraction, by estimation, is 40 to 45%. The left ventricle has mildly decreased function. The left ventricle demonstrates regional wall motion abnormalities. Definity contrast agent was given IV to delineate the left ventricular endocardial borders. The left  ventricular internal cavity size was normal in size. There is mild concentric left ventricular hypertrophy. Left ventricular diastolic function could not be evaluated. Right Ventricle: The right ventricular size is severely enlarged. No increase in right ventricular wall thickness. Right ventricular systolic function is moderately reduced. There is moderately elevated pulmonary artery systolic pressure. The tricuspid regurgitant velocity is 3.22 m/s, and with an assumed right atrial pressure of 15 mmHg, the estimated right ventricular systolic pressure is 96.7 mmHg. Left Atrium: Left atrial size was moderately dilated. Right Atrium: Right atrial size was normal in size. Pericardium: There is no evidence of pericardial effusion. Mitral Valve: The mitral valve is degenerative in appearance. There is mild calcification of the mitral valve leaflet(s). Mild to moderate mitral annular calcification. Mild to moderate mitral valve regurgitation. No evidence of mitral valve stenosis. Tricuspid Valve: The tricuspid valve is normal in structure. Tricuspid valve regurgitation is mild . No evidence of tricuspid stenosis. Aortic Valve: The aortic valve is normal in structure. Aortic valve regurgitation is not visualized. No aortic stenosis is present. Pulmonic Valve: The pulmonic valve was normal in structure. Pulmonic valve regurgitation is mild.  No evidence of pulmonic stenosis. Aorta: Aortic dilatation noted. There is mild dilatation of the aortic root, measuring 39 mm. Venous: The inferior vena cava is dilated in size with less than 50% respiratory variability, suggesting right atrial pressure of 15 mmHg. IAS/Shunts: No atrial level shunt detected by color flow Doppler.  LEFT VENTRICLE PLAX 2D LVIDd:         5.40 cm      Diastology LVIDs:         4.60 cm      LV e' medial:    9.07 cm/s LV PW:         1.20 cm      LV E/e' medial:  9.9 LV IVS:        1.20 cm      LV e' lateral:   12.85 cm/s LVOT diam:     2.10 cm      LV E/e'  lateral: 7.0 LV SV:         53 LV SV Index:   25 LVOT Area:     3.46 cm  LV Volumes (MOD) LV vol d, MOD A2C: 187.0 ml LV vol d, MOD A4C: 214.0 ml LV vol s, MOD A2C: 130.0 ml LV vol s, MOD A4C: 123.0 ml LV SV MOD A2C:     57.0 ml LV SV MOD A4C:     214.0 ml LV SV MOD BP:      74.7 ml RIGHT VENTRICLE RV S prime:     5.48 cm/s TAPSE (M-mode): 0.9 cm LEFT ATRIUM             Index        RIGHT ATRIUM           Index LA diam:        5.60 cm 2.63 cm/m   RA Area:     19.80 cm LA Vol (A2C):   99.2 ml 46.59 ml/m  RA Volume:   52.10 ml  24.47 ml/m LA Vol (A4C):   90.1 ml 42.31 ml/m LA Biplane Vol: 97.0 ml 45.55 ml/m  AORTIC VALVE LVOT Vmax:   82.20 cm/s LVOT Vmean:  56.000 cm/s LVOT VTI:    0.153 m  AORTA Ao Root diam: 3.90 cm Ao Asc diam:  3.60 cm MITRAL VALVE               TRICUSPID VALVE MV Area (PHT): 4.96 cm    TR Peak grad:   41.5 mmHg MV Decel Time: 153 msec    TR Vmax:        322.00 cm/s MR Peak grad: 89.1 mmHg MR Mean grad: 61.0 mmHg    SHUNTS MR Vmax:      472.00 cm/s  Systemic VTI:  0.15 m MR Vmean:     370.0 cm/s   Systemic Diam: 2.10 cm MR PISA:      1.62 cm MV E velocity: 89.55 cm/s Fransico Him MD Electronically signed by Fransico Him MD Signature Date/Time: 07/25/2021/3:20:00 PM    Final      LOS: 1 day   Antonieta Pert, MD [facility]  07/26/2021, 12:01 PM

## 2021-07-26 NOTE — Consult Note (Signed)
[place] Nurse Consult Note: Reason for Consult:Consult received regarding right LE site of split thickness skin graft. Patient with irrigation and debridement of a thermal injury and placement of a split thickness skin graft by Plastic Surgery Provider, Dr C. Pace on 06/28/21. He was seen in follow up in the office of that Provider on 07/06/21 where 1/2 of the staples were removed and the graft was noted to have excellent take.  Patient was to return to the Provider's office in a few weeks to have the remaining stables removed and for wound assessment. Per Bedside RN, Basilia Jumbo, patient reports that he has an appointment this week.  Wound type: Surgical Pressure Injury POA: N/A  I will defer any questions regarding this wound to Dr. Claudia Desanctis. No care is indicated at this time, but patient should follow up with that Provider upon discharge. Should he miss this week's scheduled appointment, recommend the patient call to reschedule at his earliest convenience.  [place] nursing team will not follow, but will remain available to this patient, the nursing and medical teams.  Please re-consult if needed. Thanks, Maudie Flakes, MSN, RN, [place], Arther Abbott  Pager# [phone]

## 2021-07-27 ENCOUNTER — Telehealth: Payer: Self-pay

## 2021-07-27 DIAGNOSIS — I5043 Acute on chronic combined systolic (congestive) and diastolic (congestive) heart failure: Secondary | ICD-10-CM | POA: Diagnosis not present

## 2021-07-27 DIAGNOSIS — R0789 Other chest pain: Secondary | ICD-10-CM

## 2021-07-27 LAB — BASIC METABOLIC PANEL
Anion gap: 9 (ref 5–15)
BUN: 44 mg/dL — ABNORMAL HIGH (ref 8–23)
CO2: 18 mmol/L — ABNORMAL LOW (ref 22–32)
Calcium: 8.3 mg/dL — ABNORMAL LOW (ref 8.9–10.3)
Chloride: 112 mmol/L — ABNORMAL HIGH (ref 98–111)
Creatinine, Ser: 3.78 mg/dL — ABNORMAL HIGH (ref 0.61–1.24)
GFR, Estimated: 17 mL/min — ABNORMAL LOW (ref >60)
Glucose, Bld: 154 mg/dL — ABNORMAL HIGH (ref 70–99)
Potassium: 3.6 mmol/L (ref 3.5–5.1)
Sodium: 139 mmol/L (ref 135–145)

## 2021-07-27 LAB — GLUCOSE, CAPILLARY
Glucose-Capillary: 140 mg/dL — ABNORMAL HIGH (ref 70–99)
Glucose-Capillary: 136 mg/dL — ABNORMAL HIGH (ref 70–99)
Glucose-Capillary: 172 mg/dL — ABNORMAL HIGH (ref 70–99)
Glucose-Capillary: 153 mg/dL — ABNORMAL HIGH (ref 70–99)

## 2021-07-27 LAB — TROPONIN I (HIGH SENSITIVITY)
Troponin I (High Sensitivity): 28 ng/L — ABNORMAL HIGH (ref <18)
Troponin I (High Sensitivity): 27 ng/L — ABNORMAL HIGH (ref <18)

## 2021-07-27 MED ORDER — PROCHLORPERAZINE EDISYLATE 10 MG/2ML IJ SOLN
5.0000 mg | Freq: Four times a day (QID) | INTRAMUSCULAR | Status: DC | PRN
  Administered 2021-07-27: 5 mg via INTRAVENOUS
  Filled 2021-07-27: qty 2

## 2021-07-27 MED ORDER — ACETAMINOPHEN 325 MG PO TABS
650.0000 mg | ORAL_TABLET | Freq: Four times a day (QID) | ORAL | Status: DC | PRN
  Administered 2021-07-28: 650 mg via ORAL
  Filled 2021-07-27: qty 2

## 2021-07-27 MED ORDER — FUROSEMIDE 10 MG/ML IJ SOLN
60.0000 mg | Freq: Two times a day (BID) | INTRAMUSCULAR | Status: DC
  Administered 2021-07-27 – 2021-07-28 (×2): 60 mg via INTRAVENOUS
  Filled 2021-07-27 (×2): qty 6

## 2021-07-27 MED ORDER — NITROGLYCERIN 0.4 MG SL SUBL
0.4000 mg | SUBLINGUAL_TABLET | SUBLINGUAL | Status: DC | PRN
  Filled 2021-07-27: qty 25

## 2021-07-27 MED ORDER — FUROSEMIDE 10 MG/ML IJ SOLN
20.0000 mg | Freq: Once | INTRAMUSCULAR | Status: AC
  Administered 2021-07-27: 20 mg via INTRAVENOUS
  Filled 2021-07-27: qty 2

## 2021-07-27 MED ORDER — ISOSORB DINITRATE-HYDRALAZINE 20-37.5 MG PO TABS
1.0000 | ORAL_TABLET | Freq: Three times a day (TID) | ORAL | Status: DC
  Administered 2021-07-27 – 2021-07-31 (×13): 1 via ORAL
  Filled 2021-07-27 (×12): qty 1

## 2021-07-27 NOTE — Progress Notes (Signed)
Patient c/o left sided chest pain and SOB. Vitals obtained. BP elevate. O2: 100% on room air. O2 applied for work of breathing. Pt had nose bleed x1 after sneezing, Pt states CP doesn't radiated anywhere/ MD made aware via page. See new orders.

## 2021-07-27 NOTE — Assessment & Plan Note (Signed)
Due to CKD.  Monitor bicarb.

## 2021-07-27 NOTE — Telephone Encounter (Signed)
[name] called to let us know that patient is in the hospital at [facility].  She said his wound dressing hasn't been changed since Saturday night and she would like to know how she can ensure that it will be changed.  He was supposed to come see [place] tomorrow but they have cancelled that appointment since he is in the hospital.  Please call.

## 2021-07-27 NOTE — Progress Notes (Signed)
Progress Note  Patient Name: [name] Date of Encounter: 07/27/2021  [facility] Cardiologist: Skeet Latch, MD   Subjective   Some chest pain in early AM.   Inpatient Medications    Scheduled Meds:  aspirin EC  324 mg Oral Daily   carvedilol  12.5 mg Oral BID   doxazosin  4 mg Oral Daily   enoxaparin (LOVENOX) injection  30 mg Subcutaneous Q24H   furosemide  40 mg Intravenous Q12H   insulin aspart  0-5 Units Subcutaneous QHS   insulin aspart  0-9 Units Subcutaneous TID WC   isosorbide-hydrALAZINE  1 tablet Oral BID   metoCLOPramide  10 mg Oral TID AC   multivitamin with minerals  1 tablet Oral Daily   pantoprazole  40 mg Oral Daily   potassium chloride  10 mEq Oral Daily   sertraline  100 mg Oral Daily   Continuous Infusions:  PRN Meds: acetaminophen, albuterol, guaiFENesin, nitroGLYCERIN, prochlorperazine, traMADol   Vital Signs    Vitals:   07/26/21 2012 07/27/21 0348 07/27/21 0455 07/27/21 0500  BP: (!) 153/100 (!) 167/100 (!) 170/101   Pulse: 85 80 82   Resp: 20 19 20    Temp: 97.8 F (36.6 C) 98.3 F (36.8 C)    TempSrc: Oral     SpO2: 98% 100% 100%   Weight:    94 kg  Height:        Intake/Output Summary (Last 24 hours) at 07/27/2021 0858 Last data filed at 07/26/2021 2202 Gross per 24 hour  Intake 720 ml  Output 2900 ml  Net -2180 ml   Last 3 Weights 07/27/2021 07/26/2021 07/25/2021  Weight (lbs) 207 lb 3.7 oz 206 lb 9.1 oz 209 lb 9.6 oz  Weight (kg) 94 kg 93.7 kg 95.074 kg      Telemetry    SR - Personally Reviewed  ECG    N/a - Personally Reviewed  Physical Exam   GEN: No acute distress.   Neck: No JVD Cardiac: RRR, no murmurs, rubs, or gallops.  Respiratory: Clear to auscultation bilaterally. GI: Soft, nontender, non-distended  MS: trace bilateral edema; No deformity. Neuro:  Nonfocal  Psych: Normal affect   Labs    High Sensitivity Troponin:   Recent Labs  Lab 07/25/21 0231 07/25/21 0538 07/25/21 0714  07/27/21 0526 07/27/21 0711  TROPONINIHS 39* 40* 39* 28* 27*     Chemistry Recent Labs  Lab 07/20/21 1230 07/20/21 1304 07/24/21 2338 07/25/21 0231 07/26/21 0449 07/27/21 0526  NA 139   < > 139 139 138 139  K 4.0   < > 3.7 3.4* 3.7 3.6  CL 111   < > 110 110 109 112*  CO2 16*  --  20* 20* 18* 18*  GLUCOSE 155*   < > 229* 173* 134* 154*  BUN 31*   < > 46* 45* 44* 44*  CREATININE 4.17*   < > 3.80* 3.71* 3.73* 3.78*  CALCIUM 8.6*  --  8.4* 8.3* 8.3* 8.3*  MG  --   --   --  2.1  --   --   PROT 6.2*  --  6.6 6.0*  --   --   ALBUMIN 3.3*  --  3.5 3.3*  --   --   AST 15  --  13* 11*  --   --   ALT 17  --  14 15  --   --   ALKPHOS 68  --  85 80  --   --  BILITOT 0.5  --  0.6 0.4  --   --   GFRNONAA 15*  --  17* 17* 17* 17*  ANIONGAP 12  --  9 9 11 9    < > = values in this interval not displayed.    Lipids No results for input(s): CHOL, TRIG, HDL, LABVLDL, LDLCALC, CHOLHDL in the last 168 hours.  Hematology Recent Labs  Lab 07/24/21 2338 07/25/21 0231 07/26/21 0449  WBC 6.3 6.8 5.2  RBC 3.48* 3.29* 2.99*  HGB 9.6* 8.8* 8.4*  HCT 31.6* 29.6* 26.9*  MCV 90.8 90.0 90.0  MCH 27.6 26.7 28.1  MCHC 30.4 29.7* 31.2  RDW 17.7* 17.3* 17.4*  PLT 238 234 211   Thyroid No results for input(s): TSH, FREET4 in the last 168 hours.  BNP Recent Labs  Lab 07/24/21 2338 07/26/21 0449  BNP >4,500.0* 3,538.0*    DDimer No results for input(s): DDIMER in the last 168 hours.   Radiology    ECHOCARDIOGRAM COMPLETE  Result Date: 07/25/2021    ECHOCARDIOGRAM REPORT   Patient Name:   [name] Date of Exam: 07/25/2021 Medical Rec #:  [id number]        Height:       70.0 in Accession #:    [id number]       Weight:       209.6 lb Date of Birth:  03-05-1956        BSA:          2.129 m Patient Age:    66 years         BP:           169/114 mmHg Patient Gender: M                HR:           87 bpm. Exam Location:  Inpatient Procedure: 2D Echo, Cardiac Doppler, Color Doppler and  Intracardiac            Opacification Agent Indications:    CHF-Acute Systolic D17.61  History:        Patient has prior history of Echocardiogram examinations, most                 recent 02/04/2021. CHF, CAD and Previous Myocardial Infarction,                 Stroke; Risk Factors:Hypertension, Sleep Apnea and Diabetes.                 History of cancer.  Sonographer:    Darlina Sicilian RDCS Referring Phys: [id number] OLADAPO ADEFESO IMPRESSIONS  1. Left ventricular ejection fraction, by estimation, is 40 to 45%. The left ventricle has mildly decreased function. The left ventricle demonstrates regional wall motion abnormalities (see scoring diagram/findings for description). There is mild concentric left ventricular hypertrophy. Left ventricular diastolic function could not be evaluated. There is akinesis of the left ventricular, basal inferior wall and inferoseptal wall.  2. Right ventricular systolic function is moderately reduced. The right ventricular size is severely enlarged. There is moderately elevated pulmonary artery systolic pressure. The estimated right ventricular systolic pressure is 62.6 mmHg.  3. Left atrial size was moderately dilated.  4. The mitral valve is degenerative. Mild to moderate mitral valve regurgitation. No evidence of mitral stenosis.  5. The aortic valve is normal in structure. Aortic valve regurgitation is not visualized. No aortic stenosis is present.  6. Aortic dilatation noted. There is mild dilatation of the aortic root,  measuring 39 mm.  7. The inferior vena cava is dilated in size with <50% respiratory variability, suggesting right atrial pressure of 15 mmHg. FINDINGS  Left Ventricle: Left ventricular ejection fraction, by estimation, is 40 to 45%. The left ventricle has mildly decreased function. The left ventricle demonstrates regional wall motion abnormalities. Definity contrast agent was given IV to delineate the left ventricular endocardial borders. The left ventricular  internal cavity size was normal in size. There is mild concentric left ventricular hypertrophy. Left ventricular diastolic function could not be evaluated. Right Ventricle: The right ventricular size is severely enlarged. No increase in right ventricular wall thickness. Right ventricular systolic function is moderately reduced. There is moderately elevated pulmonary artery systolic pressure. The tricuspid regurgitant velocity is 3.22 m/s, and with an assumed right atrial pressure of 15 mmHg, the estimated right ventricular systolic pressure is 62.5 mmHg. Left Atrium: Left atrial size was moderately dilated. Right Atrium: Right atrial size was normal in size. Pericardium: There is no evidence of pericardial effusion. Mitral Valve: The mitral valve is degenerative in appearance. There is mild calcification of the mitral valve leaflet(s). Mild to moderate mitral annular calcification. Mild to moderate mitral valve regurgitation. No evidence of mitral valve stenosis. Tricuspid Valve: The tricuspid valve is normal in structure. Tricuspid valve regurgitation is mild . No evidence of tricuspid stenosis. Aortic Valve: The aortic valve is normal in structure. Aortic valve regurgitation is not visualized. No aortic stenosis is present. Pulmonic Valve: The pulmonic valve was normal in structure. Pulmonic valve regurgitation is mild. No evidence of pulmonic stenosis. Aorta: Aortic dilatation noted. There is mild dilatation of the aortic root, measuring 39 mm. Venous: The inferior vena cava is dilated in size with less than 50% respiratory variability, suggesting right atrial pressure of 15 mmHg. IAS/Shunts: No atrial level shunt detected by color flow Doppler.  LEFT VENTRICLE PLAX 2D LVIDd:         5.40 cm      Diastology LVIDs:         4.60 cm      LV e' medial:    9.07 cm/s LV PW:         1.20 cm      LV E/e' medial:  9.9 LV IVS:        1.20 cm      LV e' lateral:   12.85 cm/s LVOT diam:     2.10 cm      LV E/e' lateral: 7.0  LV SV:         53 LV SV Index:   25 LVOT Area:     3.46 cm  LV Volumes (MOD) LV vol d, MOD A2C: 187.0 ml LV vol d, MOD A4C: 214.0 ml LV vol s, MOD A2C: 130.0 ml LV vol s, MOD A4C: 123.0 ml LV SV MOD A2C:     57.0 ml LV SV MOD A4C:     214.0 ml LV SV MOD BP:      74.7 ml RIGHT VENTRICLE RV S prime:     5.48 cm/s TAPSE (M-mode): 0.9 cm LEFT ATRIUM             Index        RIGHT ATRIUM           Index LA diam:        5.60 cm 2.63 cm/m   RA Area:     19.80 cm LA Vol (A2C):   99.2 ml 46.59 ml/m  RA Volume:  52.10 ml  24.47 ml/m LA Vol (A4C):   90.1 ml 42.31 ml/m LA Biplane Vol: 97.0 ml 45.55 ml/m  AORTIC VALVE LVOT Vmax:   82.20 cm/s LVOT Vmean:  56.000 cm/s LVOT VTI:    0.153 m  AORTA Ao Root diam: 3.90 cm Ao Asc diam:  3.60 cm MITRAL VALVE               TRICUSPID VALVE MV Area (PHT): 4.96 cm    TR Peak grad:   41.5 mmHg MV Decel Time: 153 msec    TR Vmax:        322.00 cm/s MR Peak grad: 89.1 mmHg MR Mean grad: 61.0 mmHg    SHUNTS MR Vmax:      472.00 cm/s  Systemic VTI:  0.15 m MR Vmean:     370.0 cm/s   Systemic Diam: 2.10 cm MR PISA:      1.62 cm MV E velocity: 89.55 cm/s Fransico Him MD Electronically signed by Fransico Him MD Signature Date/Time: 07/25/2021/3:20:00 PM    Final     Cardiac Studies    Patient Profile     [name] is a 66 y.o. male with a hx of CAD, chronic combined systolic/diasotlic HF, CKD IV, who is being seen 07/26/2021 for the evaluation of SOB at the request of Dr Lupita Leash.  Assessment & Plan  1.Acute on chronic combined systolic/diastolic HF - 01/5884 echo: LVEF 45-50%, grade I dd, normal RV - 07/2021 echo: LVEF 02-77%, indet diastolic, mod RV dysfunction, moderate PASP 57 - CXR no acute process, BNP >4500 - Dec clinic wt 199, unclear reliability of hospital weights, ?209 lbs on admissoin. Today weight down to 207 lbs.    - incomplete I/Os this admission. From available data neg 2.1 L yestrday and 2.1 L for admission. He is on IV lasix 40mg  bid. Mild variation in Cr  without clear trend  - medical therapy has been limited by side effects, renal dysfunction. He is coreg 12.5mg  bid, bidil bid.  - was on oral lasix 40mg  bid at home, likely change to torsemide at discharge.    -increase IV lasix to 60mg  bid, remains fluid overloaded   2. Chest pain - episode this AM, from [facility]float note had a reproducible component - EKG inferior and lateral precordial TWIs/ST depression. Has had some chronic changes in those leads based on prior EKG. Chronic mild trop that is flat in setting of diastolic HF, SBP 412 at the time.  - 02/2021 nuclear stress: mostly fixed basal to mid inferior defect (SDS 1), scar with minimal peri-infarct ischemia - follow symptoms with diuresis. Change bidil to tid dosing for additional bp control and nitrate effects.   3. HTN - followed in HTN clinic - difficult management due to medication side effects, - From notes has not tolerated norvasc, lisinopril, HCTZ, or beta blockers due to fatigue (though currently on coreg). Orthostatic bp's on higher dose doxazosin - follow bp's here with diuresis, will not adjust home regimen at this time.   - will increase his bidil to tid dosing.    4. CKD IV - basleine Cr 3.5 to 4    For questions or updates, please contact [place] Please consult [email or web address] for contact info under        Signed, Carlyle Dolly, MD  07/27/2021, 8:58 AM

## 2021-07-27 NOTE — Progress Notes (Signed)
PROGRESS NOTE [name]  DPO:[id number] DOB: 04-05-1956 DOA: 07/24/2021 PCP: [name], MD   Brief Narrative/Hospital Course: [name], 66 y.o. male 66 y.o. male with medical history significant of malignant neoplasm of esophagus, essential hypertension, CAD, E3XV, combine systolic and diastolic heart failure, CKD stage IV, sleep apnea on BiPAP her last ED visit at [facility] 2/7 due to chest pain seen in the ED and discharged, presents W/ several weeks of worsening shortness of breath on lying flat.  In ED- tachypneic, BP was 174/103  Labw w/ anemia, BUN/creatinine 46/3.80 (creatinine within baseline range), CBG 229, BNP > 4500, troponin x2 - 36 > 39, magnesium 2.1, phosphorus 4.0.  Influenza A, B, SARS coronavirus 2 was negative.cxr-previous CABG.  Cardiomegaly,IV Lasix 60 mgx1 and admitted for chf exacerbation. Patient had clinical improvement diuretics.  Renal function with CKD remained stable. Bnp >4500-- 3538 w/ diuresis.  Seen by cardiology in consultation and managed with diuresis.  He had issues w/ elevated blood pressure  and some chest pain during hospitalization likely noncardiac, serial troponins are negative      Subjective: Seen and examined this morning.  Overnight had some chest pain on the left side, still appears swollen and fluid overloaded.   Assessment and Plan: * Acute on chronic combined systolic and diastolic CHF (congestive heart failure) ([place])- (present on admission) -lvef 40-45 % 07/25/21, slightly worse or may be similar lvef 45-50% 02/04/21. -Overall all improving but slowly,Net IO Since Admission: -1,920 mL [07/27/21 1045] appreciate cardiology input increasing Lasix to 60 twice daily as he has ongoing fluid overload and edema.   -Continue home Coreg Cardura, BiDil increased to 3 times daily for additional BP control and chf. -Monitor I/O, daily weight, keep on salt and fluid restriction.  Cardiology consulted. His previous weight on January 25 was  95.6 kg-wt changes see below Filed Weights   07/25/21 0249 07/26/21 0538 07/27/21 0500  Weight: 95.1 kg 93.7 kg 94 kg    Essential hypertension, benign- (present on admission) Poorly controlled.  Continue home Coreg, Cardura.  Increase BiDil to 3 times daily.  Continue diuresis hopefully blood pressure will stabilize.  He was orthostatic on higher dose of doxazosin in the past.And has not tolerated Norvasc lisinopril HCTZ beta-blocker due to fatigue  Coronary artery disease involving coronary bypass graft of native heart with other forms of angina pectoris ([place])- (present on admission) Chest pain overnight but reproducible.  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  Nuclear stress 9/22 mostly fixed basal to mild inferior defects, scar with minimal peri-infarct ischemia.  Continue diuresis hopefully with it symptoms will improve.  Continue home   aspirin Coreg.Not on statin due to intolerance.  Elevated troponin- (present on admission) No delta 40> 39, subtle elevation in the setting of CHF demand ischemia  Mixed hyperlipidemia- (present on admission) Not on a statin due to intolerance.  Anemia due to chronic kidney disease Hemoglobin stable.monitor Recent Labs  Lab 07/20/21 1230 07/20/21 1304 07/24/21 2338 07/25/21 0231 07/26/21 0449  HGB 9.0* 9.9* 9.6* 8.8* 8.4*  HCT 30.4* 29.0* 31.6* 29.6* 26.9*    Obstructive sleep apnea Cont CPAP  Hyperglycemia due to diabetes mellitus (HCC) Continue sliding scale insulin.  Blood sugars stable.  Last A1c 6.4. Recent Labs  Lab 07/25/21 0539 07/25/21 0739 07/26/21 0739 07/26/21 1103 07/26/21 1623 07/26/21 2013 07/27/21 0710  GLUCAP  --    < > 119* 225* 173* 193* 140*  HGBA1C 6.4*  --   --   --   --   --   --    < > =  values in this interval not displayed.    Elevated brain natriuretic peptide (BNP) level Rule out CHF  CKD (chronic kidney disease) stage 4, GFR 15-29 ml/min (HCC)- (present on admission) Renal function at baseline- which is  3.5-4.  Stable monitor closely while on diuresis Recent Labs  Lab 07/20/21 1304 07/24/21 2338 07/25/21 0231 07/26/21 0449 07/27/21 0526  BUN 32* 46* 45* 44* 44*  CREATININE 4.60* 3.80* 3.71* 3.73* 1.82*    Metabolic acidosis Due to CKD.  Monitor bicarb.  DVT prophylaxis: enoxaparin (LOVENOX) injection 30 mg Start: 07/25/21 0700 SCDs Start: 07/25/21 0144 Code Status:   Code Status: Full Code Family Communication: plan of care discussed with patient at bedside. Disposition: Currently not medically stable for discharge. Status is: Inpatient Remains inpatient appropriate because: For ongoing management of congestive heart failure Objective: Vitals last 24 hrs: Vitals:   07/27/21 0348 07/27/21 0455 07/27/21 0500 07/27/21 1022  BP: (!) 167/100 (!) 170/101  (!) 174/100  Pulse: 80 82  99  Resp: 19 20    Temp: 98.3 F (36.8 C)     TempSrc:      SpO2: 100% 100%    Weight:   94 kg   Height:       Weight change: 0.3 kg  Physical Examination: General exam: AA0X3, older than stated age, weak appearing. HEENT:Oral mucosa moist, Ear/Nose WNL grossly, dentition normal. Respiratory system: bilaterally diminished, no use of accessory muscle Cardiovascular system: S1 & S2 +, No JVD,. Gastrointestinal system: Abdomen soft,NT,ND,BS+ Nervous System:Alert, awake, moving extremities and grossly nonfocal Extremities: LE ankle edema +, distal peripheral pulses palpable.  Skin: No rashes,no icterus. MSK: Normal muscle bulk,tone, power   Medications reviewed:  Scheduled Meds:  aspirin EC  324 mg Oral Daily   carvedilol  12.5 mg Oral BID   doxazosin  4 mg Oral Daily   enoxaparin (LOVENOX) injection  30 mg Subcutaneous Q24H   furosemide  60 mg Intravenous Q12H   insulin aspart  0-5 Units Subcutaneous QHS   insulin aspart  0-9 Units Subcutaneous TID WC   isosorbide-hydrALAZINE  1 tablet Oral TID   metoCLOPramide  10 mg Oral TID AC   multivitamin with minerals  1 tablet Oral Daily    pantoprazole  40 mg Oral Daily   potassium chloride  10 mEq Oral Daily   sertraline  100 mg Oral Daily   Continuous Infusions:    Diet Order             Diet heart healthy/carb modified Room service appropriate? Yes; Fluid consistency: Thin  Diet effective now                    Intake/Output Summary (Last 24 hours) at 07/27/2021 1049 Last data filed at 07/27/2021 0900 Gross per 24 hour  Intake 840 ml  Output 2050 ml  Net -1210 ml   Net IO Since Admission: -1,920 mL [07/27/21 1049]  Wt Readings from Last 3 Encounters:  07/27/21 94 kg  07/07/21 95.6 kg  06/28/21 88.5 kg     Unresulted Labs (From admission, onward)     Start     Ordered   08/01/21 0500  Creatinine, serum  (enoxaparin (LOVENOX)    CrCl < 30 ml/min)  Weekly,   R     Comments: while on enoxaparin therapy.    07/25/21 0143   07/26/21 9937  Basic metabolic panel  Daily,   R     Question:  Specimen collection method  Answer:  Lab=Lab collect   07/25/21 0754           Data Reviewed: I have personally reviewed following labs and imaging studies CBC: Recent Labs  Lab 07/20/21 1230 07/20/21 1304 07/24/21 2338 07/25/21 0231 07/26/21 0449  WBC 7.1  --  6.3 6.8 5.2  NEUTROABS 5.2  --  4.5  --   --   HGB 9.0* 9.9* 9.6* 8.8* 8.4*  HCT 30.4* 29.0* 31.6* 29.6* 26.9*  MCV 91.0  --  90.8 90.0 90.0  PLT 251  --  238 234 916   Basic Metabolic Panel: Recent Labs  Lab 07/20/21 1230 07/20/21 1304 07/24/21 2338 07/25/21 0231 07/26/21 0449 07/27/21 0526  NA 139 141 139 139 138 139  K 4.0 3.9 3.7 3.4* 3.7 3.6  CL 111 112* 110 110 109 112*  CO2 16*  --  20* 20* 18* 18*  GLUCOSE 155* 151* 229* 173* 134* 154*  BUN 31* 32* 46* 45* 44* 44*  CREATININE 4.17* 4.60* 3.80* 3.71* 3.73* 3.78*  CALCIUM 8.6*  --  8.4* 8.3* 8.3* 8.3*  MG  --   --   --  2.1  --   --   PHOS  --   --   --  4.0  --   --    GFR: Estimated Creatinine Clearance: 22.7 mL/min (A) (by C-G formula based on SCr of 3.78 mg/dL (H)). Liver  Function Tests: Recent Labs  Lab 07/20/21 1230 07/24/21 2338 07/25/21 0231  AST 15 13* 11*  ALT 17 14 15   ALKPHOS 68 85 80  BILITOT 0.5 0.6 0.4  PROT 6.2* 6.6 6.0*  ALBUMIN 3.3* 3.5 3.3*   No results for input(s): LIPASE, AMYLASE in the last 168 hours. No results for input(s): AMMONIA in the last 168 hours. Coagulation Profile: No results for input(s): INR, PROTIME in the last 168 hours. Cardiac Enzymes: No results for input(s): CKTOTAL, CKMB, CKMBINDEX, TROPONINI in the last 168 hours. BNP (last 3 results) No results for input(s): PROBNP in the last 8760 hours. HbA1C: Recent Labs    07/25/21 0539  HGBA1C 6.4*   CBG: Recent Labs  Lab 07/26/21 0739 07/26/21 1103 07/26/21 1623 07/26/21 2013 07/27/21 0710  GLUCAP 119* 225* 173* 193* 140*   Lipid Profile: No results for input(s): CHOL, HDL, LDLCALC, TRIG, CHOLHDL, LDLDIRECT in the last 72 hours. Thyroid Function Tests: No results for input(s): TSH, T4TOTAL, FREET4, T3FREE, THYROIDAB in the last 72 hours. Anemia Panel: No results for input(s): VITAMINB12, FOLATE, FERRITIN, TIBC, IRON, RETICCTPCT in the last 72 hours. Sepsis Labs: No results for input(s): PROCALCITON, LATICACIDVEN in the last 168 hours.  Antimicrobials: Anti-infectives (From admission, onward)    None      Culture/Microbiology Radiology Studies: ECHOCARDIOGRAM COMPLETE  Result Date: 07/25/2021    ECHOCARDIOGRAM REPORT   Patient Name:   [name] Date of Exam: 07/25/2021 Medical Rec #:  [id number]        Height:       70.0 in Accession #:    [id number]       Weight:       209.6 lb Date of Birth:  [birth year]        BSA:          2.129 m Patient Age:    87 years         BP:           169/114 mmHg Patient Gender: M  HR:           87 bpm. Exam Location:  Inpatient Procedure: 2D Echo, Cardiac Doppler, Color Doppler and Intracardiac            Opacification Agent Indications:    CHF-Acute Systolic B26.20  History:        Patient has  prior history of Echocardiogram examinations, most                 recent 02/04/2021. CHF, CAD and Previous Myocardial Infarction,                 Stroke; Risk Factors:Hypertension, Sleep Apnea and Diabetes.                 History of cancer.  Sonographer:    Darlina Sicilian RDCS Referring Phys: [id number] OLADAPO ADEFESO IMPRESSIONS  1. Left ventricular ejection fraction, by estimation, is 40 to 45%. The left ventricle has mildly decreased function. The left ventricle demonstrates regional wall motion abnormalities (see scoring diagram/findings for description). There is mild concentric left ventricular hypertrophy. Left ventricular diastolic function could not be evaluated. There is akinesis of the left ventricular, basal inferior wall and inferoseptal wall.  2. Right ventricular systolic function is moderately reduced. The right ventricular size is severely enlarged. There is moderately elevated pulmonary artery systolic pressure. The estimated right ventricular systolic pressure is 63.8 mmHg.  3. Left atrial size was moderately dilated.  4. The mitral valve is degenerative. Mild to moderate mitral valve regurgitation. No evidence of mitral stenosis.  5. The aortic valve is normal in structure. Aortic valve regurgitation is not visualized. No aortic stenosis is present.  6. Aortic dilatation noted. There is mild dilatation of the aortic root, measuring 39 mm.  7. The inferior vena cava is dilated in size with <50% respiratory variability, suggesting right atrial pressure of 15 mmHg. FINDINGS  Left Ventricle: Left ventricular ejection fraction, by estimation, is 40 to 45%. The left ventricle has mildly decreased function. The left ventricle demonstrates regional wall motion abnormalities. Definity contrast agent was given IV to delineate the left ventricular endocardial borders. The left ventricular internal cavity size was normal in size. There is mild concentric left ventricular hypertrophy. Left ventricular  diastolic function could not be evaluated. Right Ventricle: The right ventricular size is severely enlarged. No increase in right ventricular wall thickness. Right ventricular systolic function is moderately reduced. There is moderately elevated pulmonary artery systolic pressure. The tricuspid regurgitant velocity is 3.22 m/s, and with an assumed right atrial pressure of 15 mmHg, the estimated right ventricular systolic pressure is 45.3 mmHg. Left Atrium: Left atrial size was moderately dilated. Right Atrium: Right atrial size was normal in size. Pericardium: There is no evidence of pericardial effusion. Mitral Valve: The mitral valve is degenerative in appearance. There is mild calcification of the mitral valve leaflet(s). Mild to moderate mitral annular calcification. Mild to moderate mitral valve regurgitation. No evidence of mitral valve stenosis. Tricuspid Valve: The tricuspid valve is normal in structure. Tricuspid valve regurgitation is mild . No evidence of tricuspid stenosis. Aortic Valve: The aortic valve is normal in structure. Aortic valve regurgitation is not visualized. No aortic stenosis is present. Pulmonic Valve: The pulmonic valve was normal in structure. Pulmonic valve regurgitation is mild. No evidence of pulmonic stenosis. Aorta: Aortic dilatation noted. There is mild dilatation of the aortic root, measuring 39 mm. Venous: The inferior vena cava is dilated in size with less than 50% respiratory variability, suggesting right atrial pressure of  15 mmHg. IAS/Shunts: No atrial level shunt detected by color flow Doppler.  LEFT VENTRICLE PLAX 2D LVIDd:         5.40 cm      Diastology LVIDs:         4.60 cm      LV e' medial:    9.07 cm/s LV PW:         1.20 cm      LV E/e' medial:  9.9 LV IVS:        1.20 cm      LV e' lateral:   12.85 cm/s LVOT diam:     2.10 cm      LV E/e' lateral: 7.0 LV SV:         53 LV SV Index:   25 LVOT Area:     3.46 cm  LV Volumes (MOD) LV vol d, MOD A2C: 187.0 ml LV vol  d, MOD A4C: 214.0 ml LV vol s, MOD A2C: 130.0 ml LV vol s, MOD A4C: 123.0 ml LV SV MOD A2C:     57.0 ml LV SV MOD A4C:     214.0 ml LV SV MOD BP:      74.7 ml RIGHT VENTRICLE RV S prime:     5.48 cm/s TAPSE (M-mode): 0.9 cm LEFT ATRIUM             Index        RIGHT ATRIUM           Index LA diam:        5.60 cm 2.63 cm/m   RA Area:     19.80 cm LA Vol (A2C):   99.2 ml 46.59 ml/m  RA Volume:   52.10 ml  24.47 ml/m LA Vol (A4C):   90.1 ml 42.31 ml/m LA Biplane Vol: 97.0 ml 45.55 ml/m  AORTIC VALVE LVOT Vmax:   82.20 cm/s LVOT Vmean:  56.000 cm/s LVOT VTI:    0.153 m  AORTA Ao Root diam: 3.90 cm Ao Asc diam:  3.60 cm MITRAL VALVE               TRICUSPID VALVE MV Area (PHT): 4.96 cm    TR Peak grad:   41.5 mmHg MV Decel Time: 153 msec    TR Vmax:        322.00 cm/s MR Peak grad: 89.1 mmHg MR Mean grad: 61.0 mmHg    SHUNTS MR Vmax:      472.00 cm/s  Systemic VTI:  0.15 m MR Vmean:     370.0 cm/s   Systemic Diam: 2.10 cm MR PISA:      1.62 cm MV E velocity: 89.55 cm/s Fransico Him MD Electronically signed by Fransico Him MD Signature Date/Time: 07/25/2021/3:20:00 PM    Final      LOS: 2 days   Antonieta Pert, MD [facility]  07/27/2021, 10:49 AM

## 2021-07-27 NOTE — Progress Notes (Signed)
RN called due to patient complaining of left-sided chest pain which was rated as 4-5/10 on pain scale.  Chest pain started while patient was at rest in bed, it was nonradiating and was described as pressure-like, though it has a reproducible component.  Chest pain subsided without any intervention after about 15 to 20 minutes and was rated as 1/10 on pain scale at bedside. EKG done and personally reviewed showed normal sinus rhythm at a rate of 82 bpm with T wave inversion in inferolateral leads.  Troponin pending, BNP on admission was 3,538.  Patient was being followed by cardiologist. Continue telemetry Continue nitroglycerin as needed Continue Tylenol as needed for reproducible component of chest pain  Total time:  10 minutes This includes time reviewing the chart including progress notes, labs, EKGs, taking medical decisions, ordering labs and documenting findings.

## 2021-07-27 NOTE — Telephone Encounter (Addendum)
Returned [employer]. Patient had an appointment with Donna Christen on 07/28/2021, but was admitted to [facility] on 07/25/21 due to CHF. Under the advise from Dr. Claudia Desanctis, patient to call our office when he is discharged from the hospital and set up a follow up appointment with our office and will change dressings at that time. Spouse is aware of directions.

## 2021-07-27 NOTE — TOC Progression Note (Signed)
Transition of Care [facility]) - Progression Note    Patient Details  Name: [name] MRN: [id number] Date of Birth: 02-04-1956  Transition of Care [facility]) CM/SW Contact  Ihor Gully, LCSW Phone Number: 07/27/2021, 1:13 PM  Clinical Narrative:    Patient requests RW. DME providers discussed. RW ordered via [employer]. Attending aware of RW order needed.    Expected Discharge Plan: Home/Self Care Barriers to Discharge: Continued Medical Work up  Expected Discharge Plan and Services Expected Discharge Plan: Home/Self Care In-house Referral: Clinical Social Work     Living arrangements for the past 2 months: Single Family Home                 DME Arranged: [place] rolling DME Agency: [employer] Date DME Agency Contacted: 07/27/21 Time DME Agency Contacted: [phone] Representative spoke with at DME Agency: [facility] ([place]) Interventions    Readmission Risk Interventions Readmission Risk Prevention Plan 07/26/2021 03/29/2019  Transportation Screening Complete Complete  PCP or Specialist Appt within 5-7 Days - Complete  Home Care Screening - Complete  Medication Review (RN CM) - Complete  HRI or Home Care Consult Complete -  Social Work Consult for [place] Planning/Counseling Complete -  Palliative Care Screening Not Applicable -  Medication Review [employer]) Complete -  Some recent data might be hidden

## 2021-07-28 ENCOUNTER — Ambulatory Visit: Payer: Medicare HMO | Admitting: Physician Assistant

## 2021-07-28 DIAGNOSIS — E782 Mixed hyperlipidemia: Secondary | ICD-10-CM

## 2021-07-28 DIAGNOSIS — I25708 Atherosclerosis of coronary artery bypass graft(s), unspecified, with other forms of angina pectoris: Secondary | ICD-10-CM | POA: Diagnosis not present

## 2021-07-28 DIAGNOSIS — N1832 Chronic kidney disease, stage 3b: Secondary | ICD-10-CM | POA: Diagnosis not present

## 2021-07-28 DIAGNOSIS — R0602 Shortness of breath: Secondary | ICD-10-CM

## 2021-07-28 DIAGNOSIS — E872 Acidosis, unspecified: Secondary | ICD-10-CM

## 2021-07-28 DIAGNOSIS — N184 Chronic kidney disease, stage 4 (severe): Secondary | ICD-10-CM | POA: Diagnosis not present

## 2021-07-28 DIAGNOSIS — D631 Anemia in chronic kidney disease: Secondary | ICD-10-CM

## 2021-07-28 DIAGNOSIS — I5043 Acute on chronic combined systolic (congestive) and diastolic (congestive) heart failure: Secondary | ICD-10-CM | POA: Diagnosis not present

## 2021-07-28 LAB — BASIC METABOLIC PANEL
Anion gap: 13 (ref 5–15)
BUN: 44 mg/dL — ABNORMAL HIGH (ref 8–23)
CO2: 18 mmol/L — ABNORMAL LOW (ref 22–32)
Calcium: 8.8 mg/dL — ABNORMAL LOW (ref 8.9–10.3)
Chloride: 108 mmol/L (ref 98–111)
Creatinine, Ser: 3.73 mg/dL — ABNORMAL HIGH (ref 0.61–1.24)
GFR, Estimated: 17 mL/min — ABNORMAL LOW (ref >60)
Glucose, Bld: 138 mg/dL — ABNORMAL HIGH (ref 70–99)
Potassium: 4 mmol/L (ref 3.5–5.1)
Sodium: 139 mmol/L (ref 135–145)

## 2021-07-28 LAB — GLUCOSE, CAPILLARY
Glucose-Capillary: 123 mg/dL — ABNORMAL HIGH (ref 70–99)
Glucose-Capillary: 180 mg/dL — ABNORMAL HIGH (ref 70–99)
Glucose-Capillary: 142 mg/dL — ABNORMAL HIGH (ref 70–99)
Glucose-Capillary: 165 mg/dL — ABNORMAL HIGH (ref 70–99)

## 2021-07-28 MED ORDER — FUROSEMIDE 10 MG/ML IJ SOLN
20.0000 mg | Freq: Once | INTRAMUSCULAR | Status: AC
  Administered 2021-07-28: 20 mg via INTRAVENOUS
  Filled 2021-07-28: qty 2

## 2021-07-28 MED ORDER — COVID-19MRNA BIVAL VACC PFIZER 30 MCG/0.3ML IM SUSP
0.3000 mL | Freq: Once | INTRAMUSCULAR | Status: DC
  Filled 2021-07-28: qty 0.3

## 2021-07-28 MED ORDER — FUROSEMIDE 10 MG/ML IJ SOLN
80.0000 mg | Freq: Two times a day (BID) | INTRAMUSCULAR | Status: DC
  Administered 2021-07-28 – 2021-08-02 (×10): 80 mg via INTRAVENOUS
  Filled 2021-07-28 (×11): qty 8

## 2021-07-28 NOTE — Progress Notes (Signed)
Progress Note  Patient Name: PATTERSON HOLLENBAUGH Date of Encounter: 07/28/2021  [facility] Cardiologist: Skeet Latch, MD   Subjective   Some ongoign SOB  Inpatient Medications    Scheduled Meds:  aspirin EC  324 mg Oral Daily   carvedilol  12.5 mg Oral BID   doxazosin  4 mg Oral Daily   enoxaparin (LOVENOX) injection  30 mg Subcutaneous Q24H   furosemide  60 mg Intravenous Q12H   insulin aspart  0-5 Units Subcutaneous QHS   insulin aspart  0-9 Units Subcutaneous TID WC   isosorbide-hydrALAZINE  1 tablet Oral TID   metoCLOPramide  10 mg Oral TID AC   multivitamin with minerals  1 tablet Oral Daily   pantoprazole  40 mg Oral Daily   potassium chloride  10 mEq Oral Daily   sertraline  100 mg Oral Daily   Continuous Infusions:  PRN Meds: acetaminophen, albuterol, guaiFENesin, nitroGLYCERIN, prochlorperazine, traMADol   Vital Signs    Vitals:   07/27/21 1022 07/27/21 1238 07/27/21 2149 07/28/21 0511  BP: (!) 174/100 (!) 155/105 (!) 156/95 (!) 168/109  Pulse: 99 83 76 80  Resp:  17 18 18   Temp:  97.9 F (36.6 C) (!) 97.5 F (36.4 C) 97.6 F (36.4 C)  TempSrc:  Oral Oral Oral  SpO2:  100% 99% 100%  Weight:    92.8 kg  Height:        Intake/Output Summary (Last 24 hours) at 07/28/2021 0850 Last data filed at 07/28/2021 0500 Gross per 24 hour  Intake 940 ml  Output 1050 ml  Net -110 ml   Last 3 Weights 07/28/2021 07/27/2021 07/26/2021  Weight (lbs) 204 lb 9.6 oz 207 lb 3.7 oz 206 lb 9.1 oz  Weight (kg) 92.806 kg 94 kg 93.7 kg      Telemetry    SR - Personally Reviewed  ECG    N/a - Personally Reviewed  Physical Exam   GEN: No acute distress.   Neck: elevated JVD Cardiac: RRR, no murmurs, rubs, or gallops.  Respiratory: Clear to auscultation bilaterally. GI: Soft, nontender, non-distended  MS: 1+bilateral LE edema No deformity. Neuro:  Nonfocal  Psych: Normal affect   Labs    High Sensitivity Troponin:   Recent Labs  Lab 07/25/21 0231  07/25/21 0538 07/25/21 0714 07/27/21 0526 07/27/21 0711  TROPONINIHS 39* 40* 39* 28* 27*     Chemistry Recent Labs  Lab 07/24/21 2338 07/25/21 0231 07/26/21 0449 07/27/21 0526 07/28/21 0555  NA 139 139 138 139 139  K 3.7 3.4* 3.7 3.6 4.0  CL 110 110 109 112* 108  CO2 20* 20* 18* 18* 18*  GLUCOSE 229* 173* 134* 154* 138*  BUN 46* 45* 44* 44* 44*  CREATININE 3.80* 3.71* 3.73* 3.78* 3.73*  CALCIUM 8.4* 8.3* 8.3* 8.3* 8.8*  MG  --  2.1  --   --   --   PROT 6.6 6.0*  --   --   --   ALBUMIN 3.5 3.3*  --   --   --   AST 13* 11*  --   --   --   ALT 14 15  --   --   --   ALKPHOS 85 80  --   --   --   BILITOT 0.6 0.4  --   --   --   GFRNONAA 17* 17* 17* 17* 17*  ANIONGAP 9 9 11 9 13     Lipids No results for input(s): CHOL, TRIG,  HDL, LABVLDL, LDLCALC, CHOLHDL in the last 168 hours.  Hematology Recent Labs  Lab 07/24/21 2338 07/25/21 0231 07/26/21 0449  WBC 6.3 6.8 5.2  RBC 3.48* 3.29* 2.99*  HGB 9.6* 8.8* 8.4*  HCT 31.6* 29.6* 26.9*  MCV 90.8 90.0 90.0  MCH 27.6 26.7 28.1  MCHC 30.4 29.7* 31.2  RDW 17.7* 17.3* 17.4*  PLT 238 234 211   Thyroid No results for input(s): TSH, FREET4 in the last 168 hours.  BNP Recent Labs  Lab 07/24/21 2338 07/26/21 0449  BNP >4,500.0* 3,538.0*    DDimer No results for input(s): DDIMER in the last 168 hours.   Radiology    No results found.  Cardiac Studies    Patient Profile     OLUMIDE DOLINGER is a 66 y.o. male with a hx of CAD, chronic combined systolic/diasotlic HF, CKD IV, who is being seen 07/26/2021 for the evaluation of SOB at the request of Dr Lupita Leash.  Assessment & Plan    1.Acute on chronic combined systolic/diastolic HF - 01/8501 echo: LVEF 45-50%, grade I dd, normal RV - 07/2021 echo: LVEF 77-41%, indet diastolic, mod RV dysfunction, moderate PASP 57 - CXR no acute process, BNP >4500 - Dec clinic wt 199, unclear reliability of hospital weights, ?209 lbs on admissoin. Today weight down to 204 lbs.    - incomplete  I/Os this admission. From available data neg 260 mL yestrday and 2.39 L for admission. He is on IV lasix increased to 60mg  bid yesterday. Renal function is stable. BNP >4500 on admit, on repeat 3500. We will increase IV lasix to 80mg  bid today   - medical therapy has been limited by med side effects, renal dysfunction. He is coreg 12.5mg  bid, bidil that was changed from bid to tid this admit.  - was on oral lasix 40mg  bid at home, likely change to torsemide at discharge.        2. Chest pain - episode yesterday AM, from nightfloat note had a reproducible component - EKG inferior and lateral precordial TWIs/ST depression. Has had some chronic changes in those leads based on prior EKG. Chronic mild trop that is flat in setting of diastolic HF, SBP 287 at the time.  - 02/2021 nuclear stress: mostly fixed basal to mid inferior defect (SDS 1), scar with minimal peri-infarct ischemia - follow symptoms with diuresis. Change bidil to tid dosing for additional bp control and nitrate effects.    3. HTN - followed in HTN clinic - difficult management due to medication side effects, - From notes has not tolerated norvasc, lisinopril, HCTZ, or beta blockers due to fatigue (though currently on coreg). Orthostatic bp's on higher dose doxazosin - follow bp's here with diuresis, we did increase his bidil to tid.        4. CKD IV - basleine Cr 3.5 to 4  For questions or updates, please contact [place] Please consult [email or web address] for contact info under        Signed, Carlyle Dolly, MD  07/28/2021, 8:50 AM

## 2021-07-28 NOTE — Progress Notes (Signed)
PROGRESS NOTE    [name]  MLY:[id number] DOB: 12/14/1955 DOA: 07/24/2021 PCP: [name], MD     Brief Narrative:   66 y.o. WM PMHx malignant neoplasm of esophagus, essential hypertension, CAD, C1EX, combine systolic and diastolic heart failure, CKD stage IV, sleep apnea on BiPAP her last ED visit at [facility] 2/7 due to chest pain seen in the ED and discharged, presents W/ several weeks of worsening shortness of breath on lying flat.   In ED- tachypneic, BP was 174/103  Labw w/ anemia, BUN/creatinine 46/3.80 (creatinine within baseline range), CBG 229, BNP > 4500, troponin x2 - 36 > 39, magnesium 2.1, phosphorus 4.0.  Influenza A, B, SARS coronavirus 2 was negative.cxr-previous CABG.  Cardiomegaly,IV Lasix 60 mgx1 and admitted for chf exacerbation. Patient had clinical improvement diuretics.  Renal function with CKD remained stable. Bnp >4500-- 3538 w/ diuresis.  Seen by cardiology in consultation and managed with diuresis.  He had issues w/ elevated blood pressure  and some chest pain during hospitalization likely noncardiac, serial troponins are negative      Subjective: Afebrile overnight, sleepy but arousable A/O x4.  States base weight 200 pounds (90.7 kg).  States does not weigh himself daily   Assessment & Plan: Covid vaccination; vaccinated 2/3 believes it was [employer].  States would like Bivalent booster.  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 -Overall all improving but slowly,Net IO Since Admission: -1,920 mL [07/27/21 1045] appreciate cardiology input increasing Lasix to 60 twice daily as he has ongoing fluid overload and edema.   -Continue home Coreg Cardura, BiDil increased to 3 times daily for additional BP control and chf. -Monitor I/O, daily weight, keep on salt and fluid restriction.  Cardiology consulted. His previous weight on January 25 was 95.6 kg-wt changes see below -Strict in and out - Daily weight -Coreg 12.5 mg BID - Cardura 4 mg daily - 2/15 Lasix IV increased to 80 mg BID -BiDil 20-37.5 mg TID   Essential hypertension, benign- (present on admission) -Poorly controlled.  -Cardiology on board.  We will help with adjustments of medication - See CHF    CAD involving coronary bypass graft of native heart with other forms of angina pectoris ([employer])- (present on admission) -Chest pain overnight but reproducible.   -Previous CABG 03/25/2019.   -Nuclear stress 9/22 mostly fixed basal to mild inferior defects, scar with minimal peri-infarct ischemia.  Continue diuresis hopefully with it symptoms will improve.   -Not on statin due to intolerance.   Elevated troponin- (present on admission) -No delta 40> 39, subtle elevation in the setting of CHF demand ischemia   Mixed hyperlipidemia- (present on admission) -Not on a statin due to intolerance.   Anemia due to chronic kidney disease -Hemoglobin stable.monitor -2/15 anemia panel pending   Obstructive sleep apnea -Cont CPAP   DM type II with hyperglycemia  -  Continue sliding scale insulin.  Blood sugars stable.  Last A1c 6.4.  CKD (chronic kidney disease) stage 4, GFR 15-29 ml/min (HCC)- (present on admission) Renal function at baseline- which is 3.5-4.   Lab Results  Component Value Date   CREATININE 3.73 (H) 07/28/2021   CREATININE 3.78 (H)  07/27/2021   CREATININE 3.73 (H) 07/26/2021   CREATININE 3.71 (H) 07/25/2021   CREATININE 3.80 (H) 07/24/2021  -Baseline  Metabolic acidosis Due to CKD.  Monitor bicarb.    DVT prophylaxis: Lovenox Code Status: Full Family Communication: 2/15 Status is: Inpatient    Dispo: The patient is from: Home              Anticipated d/c is to: Home              Anticipated d/c date is: > 3 days              Patient currently is not medically stable to d/c.      Consultants:   Cardiology  Procedures/Significant Events:    I have personally reviewed and interpreted all radiology studies and my findings are as above.  VENTILATOR SETTINGS:    Cultures   Antimicrobials:    Devices    LINES / TUBES:      Continuous Infusions:   Objective: Vitals:   07/27/21 1022 07/27/21 1238 07/27/21 2149 07/28/21 0511  BP: (!) 174/100 (!) 155/105 (!) 156/95 (!) 168/109  Pulse: 99 83 76 80  Resp:  17 18 18   Temp:  97.9 F (36.6 C) (!) 97.5 F (36.4 C) 97.6 F (36.4 C)  TempSrc:  Oral Oral Oral  SpO2:  100% 99% 100%  Weight:    92.8 kg  Height:        Intake/Output Summary (Last 24 hours) at 07/28/2021 0755 Last data filed at 07/28/2021 0500 Gross per 24 hour  Intake 940 ml  Output 1050 ml  Net -110 ml   Filed Weights   07/26/21 0538 07/27/21 0500 07/28/21 0511  Weight: 93.7 kg 94 kg 92.8 kg    Examination:  General: A/O x4 No acute respiratory distress Eyes: negative scleral hemorrhage, negative anisocoria, negative icterus ENT: Negative Runny nose, negative gingival bleeding, Neck:  Negative scars, masses, torticollis, lymphadenopathy, JVD Lungs: Clear to auscultation bilaterally without wheezes or crackles Cardiovascular: Regular rate and rhythm without murmur gallop or rub normal S1 and S2 Abdomen: negative abdominal pain, nondistended, positive soft, bowel sounds, no rebound, no ascites, no appreciable mass Extremities: No significant cyanosis,  clubbing, or edema bilateral lower extremities Skin: Negative rashes, lesions, ulcers Psychiatric:  Negative depression, negative anxiety, negative fatigue, negative mania  Central nervous system:  Cranial nerves II through XII intact, tongue/uvula midline, all extremities muscle strength 5/5, sensation intact throughout, negative dysarthria, negative expressive aphasia, negative receptive aphasia.  .     Data Reviewed: Care during the described time interval was provided by me .  I have reviewed this patient's available data, including medical history, events of note, physical examination, and all test results as part of my evaluation.  CBC: Recent Labs  Lab 07/24/21 2338 07/25/21 0231 07/26/21 0449  WBC 6.3 6.8 5.2  NEUTROABS 4.5  --   --   HGB 9.6* 8.8* 8.4*  HCT 31.6* 29.6* 26.9*  MCV 90.8 90.0 90.0  PLT 238 234 081   Basic Metabolic Panel: Recent Labs  Lab 07/24/21 2338 07/25/21 0231 07/26/21 0449 07/27/21 0526 07/28/21 0555  NA 139 139 138 139 139  K  3.7 3.4* 3.7 3.6 4.0  CL 110 110 109 112* 108  CO2 20* 20* 18* 18* 18*  GLUCOSE 229* 173* 134* 154* 138*  BUN 46* 45* 44* 44* 44*  CREATININE 3.80* 3.71* 3.73* 3.78* 3.73*  CALCIUM 8.4* 8.3* 8.3* 8.3* 8.8*  MG  --  2.1  --   --   --   PHOS  --  4.0  --   --   --    GFR: Estimated Creatinine Clearance: 22.9 mL/min (A) (by C-G formula based on SCr of 3.73 mg/dL (H)). Liver Function Tests: Recent Labs  Lab 07/24/21 2338 07/25/21 0231  AST 13* 11*  ALT 14 15  ALKPHOS 85 80  BILITOT 0.6 0.4  PROT 6.6 6.0*  ALBUMIN 3.5 3.3*   No results for input(s): LIPASE, AMYLASE in the last 168 hours. No results for input(s): AMMONIA in the last 168 hours. Coagulation Profile: No results for input(s): INR, PROTIME in the last 168 hours. Cardiac Enzymes: No results for input(s): CKTOTAL, CKMB, CKMBINDEX, TROPONINI in the last 168 hours. BNP (last 3 results) No results for input(s): PROBNP in the last 8760  hours. HbA1C: No results for input(s): HGBA1C in the last 72 hours. CBG: Recent Labs  Lab 07/27/21 0710 07/27/21 1116 07/27/21 1605 07/27/21 2147 07/28/21 0751  GLUCAP 140* 136* 172* 153* 123*   Lipid Profile: No results for input(s): CHOL, HDL, LDLCALC, TRIG, CHOLHDL, LDLDIRECT in the last 72 hours. Thyroid Function Tests: No results for input(s): TSH, T4TOTAL, FREET4, T3FREE, THYROIDAB in the last 72 hours. Anemia Panel: No results for input(s): VITAMINB12, FOLATE, FERRITIN, TIBC, IRON, RETICCTPCT in the last 72 hours. Sepsis Labs: No results for input(s): PROCALCITON, LATICACIDVEN in the last 168 hours.  Recent Results (from the past 240 hour(s))  Resp Panel by RT-PCR (Flu A&B, Covid) Nasopharyngeal Swab     Status: None   Collection Time: 07/24/21 11:43 PM   Specimen: Nasopharyngeal Swab; Nasopharyngeal(NP) swabs in vial transport medium  Result Value Ref Range Status   SARS Coronavirus 2 by RT PCR NEGATIVE NEGATIVE Final    Comment: (NOTE) SARS-CoV-2 target nucleic acids are NOT DETECTED.  The SARS-CoV-2 RNA is generally detectable in upper respiratory specimens during the acute phase of infection. The lowest concentration of SARS-CoV-2 viral copies this assay can detect is 138 copies/mL. A negative result does not preclude SARS-Cov-2 infection and should not be used as the sole basis for treatment or other patient management decisions. A negative result may occur with  improper specimen collection/handling, submission of specimen other than nasopharyngeal swab, presence of viral mutation(s) within the areas targeted by this assay, and inadequate number of viral copies(<138 copies/mL). A negative result must be combined with clinical observations, patient history, and epidemiological information. The expected result is Negative.  Fact Sheet for Patients:  EntrepreneurPulse.com.au  Fact Sheet for Healthcare Providers:   IncredibleEmployment.be  This test is no t yet approved or cleared by the [place] FDA and  has been authorized for detection and/or diagnosis of SARS-CoV-2 by FDA under an Emergency Use Authorization (EUA). This EUA will remain  in effect (meaning this test can be used) for the duration of the COVID-19 declaration under Section 564(b)(1) of the Act, 21 U.S.C.section 360bbb-3(b)(1), unless the authorization is terminated  or revoked sooner.       Influenza A by PCR NEGATIVE NEGATIVE Final   Influenza B by PCR NEGATIVE NEGATIVE Final    Comment: (NOTE) The Xpert Xpress SARS-CoV-2/FLU/RSV plus assay is intended as  an aid in the diagnosis of influenza from Nasopharyngeal swab specimens and should not be used as a sole basis for treatment. Nasal washings and aspirates are unacceptable for Xpert Xpress SARS-CoV-2/FLU/RSV testing.  Fact Sheet for Patients: EntrepreneurPulse.com.au  Fact Sheet for Healthcare Providers: IncredibleEmployment.be  This test is not yet approved or cleared by the [place] FDA and has been authorized for detection and/or diagnosis of SARS-CoV-2 by FDA under an Emergency Use Authorization (EUA). This EUA will remain in effect (meaning this test can be used) for the duration of the COVID-19 declaration under Section 564(b)(1) of the Act, 21 U.S.C. section 360bbb-3(b)(1), unless the authorization is terminated or revoked.  Performed at [facility], [address], [place], [place] [address]          Radiology Studies: No results found.      Scheduled Meds:  aspirin EC  324 mg Oral Daily   carvedilol  12.5 mg Oral BID   doxazosin  4 mg Oral Daily   enoxaparin (LOVENOX) injection  30 mg Subcutaneous Q24H   furosemide  60 mg Intravenous Q12H   insulin aspart  0-5 Units Subcutaneous QHS   insulin aspart  0-9 Units Subcutaneous TID WC   isosorbide-hydrALAZINE  1 tablet Oral TID    metoCLOPramide  10 mg Oral TID AC   multivitamin with minerals  1 tablet Oral Daily   pantoprazole  40 mg Oral Daily   potassium chloride  10 mEq Oral Daily   sertraline  100 mg Oral Daily   Continuous Infusions:   LOS: 3 days    Time spent:40 min    Orson Rho, Geraldo Docker, MD [facility]   If 7PM-7AM, please contact night-coverage 07/28/2021, 7:55 AM

## 2021-07-28 NOTE — Plan of Care (Signed)

## 2021-07-29 DIAGNOSIS — N184 Chronic kidney disease, stage 4 (severe): Secondary | ICD-10-CM | POA: Diagnosis not present

## 2021-07-29 DIAGNOSIS — I2510 Atherosclerotic heart disease of native coronary artery without angina pectoris: Secondary | ICD-10-CM

## 2021-07-29 DIAGNOSIS — N1832 Chronic kidney disease, stage 3b: Secondary | ICD-10-CM | POA: Diagnosis not present

## 2021-07-29 DIAGNOSIS — I25708 Atherosclerosis of coronary artery bypass graft(s), unspecified, with other forms of angina pectoris: Secondary | ICD-10-CM | POA: Diagnosis not present

## 2021-07-29 DIAGNOSIS — I10 Essential (primary) hypertension: Secondary | ICD-10-CM | POA: Diagnosis not present

## 2021-07-29 DIAGNOSIS — I5043 Acute on chronic combined systolic (congestive) and diastolic (congestive) heart failure: Secondary | ICD-10-CM | POA: Diagnosis not present

## 2021-07-29 LAB — COMPREHENSIVE METABOLIC PANEL
ALT: 8 U/L (ref 0–44)
AST: 9 U/L — ABNORMAL LOW (ref 15–41)
Albumin: 3.3 g/dL — ABNORMAL LOW (ref 3.5–5.0)
Alkaline Phosphatase: 58 U/L (ref 38–126)
Anion gap: 14 (ref 5–15)
BUN: 44 mg/dL — ABNORMAL HIGH (ref 8–23)
CO2: 18 mmol/L — ABNORMAL LOW (ref 22–32)
Calcium: 8.5 mg/dL — ABNORMAL LOW (ref 8.9–10.3)
Chloride: 107 mmol/L (ref 98–111)
Creatinine, Ser: 3.89 mg/dL — ABNORMAL HIGH (ref 0.61–1.24)
GFR, Estimated: 16 mL/min — ABNORMAL LOW (ref >60)
Glucose, Bld: 141 mg/dL — ABNORMAL HIGH (ref 70–99)
Potassium: 3.6 mmol/L (ref 3.5–5.1)
Sodium: 139 mmol/L (ref 135–145)
Total Bilirubin: 0.4 mg/dL (ref 0.3–1.2)
Total Protein: 5.9 g/dL — ABNORMAL LOW (ref 6.5–8.1)

## 2021-07-29 LAB — GLUCOSE, CAPILLARY
Glucose-Capillary: 126 mg/dL — ABNORMAL HIGH (ref 70–99)
Glucose-Capillary: 229 mg/dL — ABNORMAL HIGH (ref 70–99)
Glucose-Capillary: 191 mg/dL — ABNORMAL HIGH (ref 70–99)
Glucose-Capillary: 135 mg/dL — ABNORMAL HIGH (ref 70–99)

## 2021-07-29 LAB — CBC WITH DIFFERENTIAL/PLATELET
Abs Immature Granulocytes: 0.02 10*3/uL (ref 0.00–0.07)
Basophils Absolute: 0.1 10*3/uL (ref 0.0–0.1)
Basophils Relative: 2 %
Eosinophils Absolute: 0.2 10*3/uL (ref 0.0–0.5)
Eosinophils Relative: 4 %
HCT: 29.4 % — ABNORMAL LOW (ref 39.0–52.0)
Hemoglobin: 8.6 g/dL — ABNORMAL LOW (ref 13.0–17.0)
Immature Granulocytes: 0 %
Lymphocytes Relative: 21 %
Lymphs Abs: 1.1 10*3/uL (ref 0.7–4.0)
MCH: 26.3 pg (ref 26.0–34.0)
MCHC: 29.3 g/dL — ABNORMAL LOW (ref 30.0–36.0)
MCV: 89.9 fL (ref 80.0–100.0)
Monocytes Absolute: 0.6 10*3/uL (ref 0.1–1.0)
Monocytes Relative: 11 %
Neutro Abs: 3.1 10*3/uL (ref 1.7–7.7)
Neutrophils Relative %: 62 %
Platelets: 221 10*3/uL (ref 150–400)
RBC: 3.27 MIL/uL — ABNORMAL LOW (ref 4.22–5.81)
RDW: 17.1 % — ABNORMAL HIGH (ref 11.5–15.5)
WBC: 5.1 10*3/uL (ref 4.0–10.5)
nRBC: 0 % (ref 0.0–0.2)

## 2021-07-29 LAB — FERRITIN: Ferritin: 148 ng/mL (ref 24–336)

## 2021-07-29 LAB — RETICULOCYTES
Immature Retic Fract: 13.2 % (ref 2.3–15.9)
RBC.: 3.23 MIL/uL — ABNORMAL LOW (ref 4.22–5.81)
Retic Count, Absolute: 67.8 10*3/uL (ref 19.0–186.0)
Retic Ct Pct: 2.1 % (ref 0.4–3.1)

## 2021-07-29 LAB — IRON AND TIBC
Iron: 32 ug/dL — ABNORMAL LOW (ref 45–182)
Saturation Ratios: 12 % — ABNORMAL LOW (ref 17.9–39.5)
TIBC: 264 ug/dL (ref 250–450)
UIBC: 232 ug/dL

## 2021-07-29 LAB — PHOSPHORUS: Phosphorus: 4.3 mg/dL (ref 2.5–4.6)

## 2021-07-29 LAB — MAGNESIUM: Magnesium: 2 mg/dL (ref 1.7–2.4)

## 2021-07-29 LAB — FOLATE: Folate: 6.6 ng/mL (ref >5.9)

## 2021-07-29 LAB — VITAMIN B12: Vitamin B-12: 432 pg/mL (ref 180–914)

## 2021-07-29 MED ORDER — BACITRACIN ZINC 500 UNIT/GM EX OINT
TOPICAL_OINTMENT | Freq: Two times a day (BID) | CUTANEOUS | Status: DC
  Administered 2021-08-01: 1 via TOPICAL
  Filled 2021-07-29: qty 0.9

## 2021-07-29 NOTE — Progress Notes (Addendum)
Progress Note  Patient Name: [name] Date of Encounter: 07/29/2021  [facility] Cardiologist: Skeet Latch, MD   Subjective   Breathing improving but still not back to baseline. With orthopnea overnight and this AM. No specific chest pain or palpitations.   Inpatient Medications    Scheduled Meds:  aspirin EC  324 mg Oral Daily   carvedilol  12.5 mg Oral BID   COVID-19 mRNA bivalent vaccine (Pfizer)  0.3 mL Intramuscular Once   doxazosin  4 mg Oral Daily   enoxaparin (LOVENOX) injection  30 mg Subcutaneous Q24H   furosemide  80 mg Intravenous Q12H   insulin aspart  0-5 Units Subcutaneous QHS   insulin aspart  0-9 Units Subcutaneous TID WC   isosorbide-hydrALAZINE  1 tablet Oral TID   metoCLOPramide  10 mg Oral TID AC   multivitamin with minerals  1 tablet Oral Daily   pantoprazole  40 mg Oral Daily   potassium chloride  10 mEq Oral Daily   sertraline  100 mg Oral Daily   Continuous Infusions:  PRN Meds: acetaminophen, albuterol, guaiFENesin, nitroGLYCERIN, prochlorperazine, traMADol   Vital Signs    Vitals:   07/28/21 1311 07/28/21 1751 07/28/21 2123 07/29/21 0530  BP: (!) 154/101 (!) 142/77 (!) 154/97 (!) 176/102  Pulse: 81  80 78  Resp: 17  18 18   Temp: 98.2 F (36.8 C)  (!) 97.5 F (36.4 C) 97.9 F (36.6 C)  TempSrc: Oral  Oral Oral  SpO2: 98%  100% 98%  Weight:    91.6 kg  Height:        Intake/Output Summary (Last 24 hours) at 07/29/2021 1009 Last data filed at 07/29/2021 0544 Gross per 24 hour  Intake 480 ml  Output 1900 ml  Net -1420 ml   Last 3 Weights 07/29/2021 07/28/2021 07/28/2021  Weight (lbs) 201 lb 15.1 oz 207 lb 9.6 oz 204 lb 9.6 oz  Weight (kg) 91.6 kg 94.167 kg 92.806 kg      Telemetry    NSR, HR in 70's to 80's. Occasional PVC's.  - Personally Reviewed  ECG    No new tracings.   Physical Exam   GEN: Pleasant male appearing in no acute distress.   Neck: JVD appears elevated to 9-10 cm.  Cardiac: RRR, no  murmurs, rubs, or gallops.  Respiratory: Rales along right base. No wheezing.  GI: Soft, nontender, non-distended  MS: 1+ pitting edema; No deformity. Right leg wrapped.  Neuro:  Nonfocal  Psych: Normal affect   Labs    High Sensitivity Troponin:   Recent Labs  Lab 07/25/21 0231 07/25/21 0538 07/25/21 0714 07/27/21 0526 07/27/21 0711  TROPONINIHS 39* 40* 39* 28* 27*     Chemistry Recent Labs  Lab 07/24/21 2338 07/25/21 0231 07/26/21 0449 07/27/21 0526 07/28/21 0555 07/29/21 0510  NA 139 139   < > 139 139 139  K 3.7 3.4*   < > 3.6 4.0 3.6  CL 110 110   < > 112* 108 107  CO2 20* 20*   < > 18* 18* 18*  GLUCOSE 229* 173*   < > 154* 138* 141*  BUN 46* 45*   < > 44* 44* 44*  CREATININE 3.80* 3.71*   < > 3.78* 3.73* 3.89*  CALCIUM 8.4* 8.3*   < > 8.3* 8.8* 8.5*  MG  --  2.1  --   --   --  2.0  PROT 6.6 6.0*  --   --   --  5.9*  ALBUMIN 3.5 3.3*  --   --   --  3.3*  AST 13* 11*  --   --   --  9*  ALT 14 15  --   --   --  8  ALKPHOS 85 80  --   --   --  58  BILITOT 0.6 0.4  --   --   --  0.4  GFRNONAA 17* 17*   < > 17* 17* 16*  ANIONGAP 9 9   < > 9 13 14    < > = values in this interval not displayed.    Lipids No results for input(s): CHOL, TRIG, HDL, LABVLDL, LDLCALC, CHOLHDL in the last 168 hours.  Hematology Recent Labs  Lab 07/25/21 0231 07/26/21 0449 07/29/21 0510  WBC 6.8 5.2 5.1  RBC 3.29* 2.99* 3.27*   3.23*  HGB 8.8* 8.4* 8.6*  HCT 29.6* 26.9* 29.4*  MCV 90.0 90.0 89.9  MCH 26.7 28.1 26.3  MCHC 29.7* 31.2 29.3*  RDW 17.3* 17.4* 17.1*  PLT 234 211 221   Thyroid No results for input(s): TSH, FREET4 in the last 168 hours.  BNP Recent Labs  Lab 07/24/21 2338 07/26/21 0449  BNP >4,500.0* 3,538.0*    DDimer No results for input(s): DDIMER in the last 168 hours.   Radiology    No results found.  Cardiac Studies   NST: 02/2021 Findings are consistent with prior myocardial infarction with peri-infarct ischemia. The study is high risk.   No ST  deviation was noted.   LV perfusion is abnormal. There is evidence of ischemia. There is evidence of infarction. Defect 1: There is a medium defect with moderate reduction in uptake present in the mid to basal inferior and inferolateral location(s) that is fixed. There is abnormal wall motion in the defect area. Consistent with infarction and peri-infarct ischemia.   Left ventricular function is abnormal. Global function is severely reduced. There was a single regional abnormality. Nuclear stress EF: 28 %. The left ventricular ejection fraction is severely decreased (<30%). End diastolic cavity size is moderately enlarged. End systolic cavity size is moderately enlarged.   Prior study available for comparison from 09/25/2019. There are changes compared to prior study. The left ventricular ejection fraction has decreased. LVEF 35%, moderate size and intensity fixed inferior defect suggestive of scar with mild peri-infarct ischemia   Moderate size and intensity, mostly fixed basal to mid inferior and inferolateral perfusion defect (SDS 1), suggestive of scar with minimal peri-infarct ischemia. LVEF 28% with global hypokinesis and basal to mid inferior akinesis. This is a high risk study. In comparison with a prior study in 2021, the LVEF is lower (was previously 35%), however, there are no new reversible perfusion defects.    Echo: 07/25/2021 IMPRESSIONS     1. Left ventricular ejection fraction, by estimation, is 40 to 45%. The  left ventricle has mildly decreased function. The left ventricle  demonstrates regional wall motion abnormalities (see scoring  diagram/findings for description). There is mild  concentric left ventricular hypertrophy. Left ventricular diastolic  function could not be evaluated. There is akinesis of the left  ventricular, basal inferior wall and inferoseptal wall.   2. Right ventricular systolic function is moderately reduced. The right  ventricular size is severely  enlarged. There is moderately elevated  pulmonary artery systolic pressure. The estimated right ventricular  systolic pressure is 13.2 mmHg.   3. Left atrial size was moderately dilated.   4. The mitral valve is degenerative.  Mild to moderate mitral valve  regurgitation. No evidence of mitral stenosis.   5. The aortic valve is normal in structure. Aortic valve regurgitation is  not visualized. No aortic stenosis is present.   6. Aortic dilatation noted. There is mild dilatation of the aortic root,  measuring 39 mm.   7. The inferior vena cava is dilated in size with <50% respiratory  variability, suggesting right atrial pressure of 15 mmHg.   Patient Profile     66 y.o. 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   Assessment & Plan    1. Acute HFmrEF - EF was at 45-50% in 01/2021 and at 40-45% this admission. BNP > 4500 on admission. He has been responding well to IV Lasix and this was increased from 60mg  BID to 80mg  BID on 2/15. Weights have been variable but have overall trended down from 209 lbs on admission to 201 lbs today (was at 199 lbs in 05/2021). Creatinine has trended up slightly but overall stable when compared to prior values, at 3.89. Would continue with IV Lasix at current dosing of 80mg  BID today. Likely transition to Torsemide in 24-48 hours.  - Remains on Coreg and Bi-Dil for his cardiomyopathy. No ACE-I/ARB/ARNI/Spiro/SGLT2 inhibitor given his CKD.   2. HTN - [facility] and management has been challenging given his orthostatic symptoms. BP elevated to 176/102 this AM but he had not yet received his medications. Would follow with recent dose adjustment of Bi-Dil. Remains on Coreg 12.5mg  BID, Bi-Dil 20-37.5mg  TID and Cardura 4mg  daily.   3. CAD - He is s/p CABG in 03/2019. NST in  02/2021 showed fixed defects with minimal pero-infarct ischemia and medical management was recommended as he would not be a candidate for repeat catheterization given his worsening kidney function.  - Remains on ASA and BB. Intolerant to statins and on Repatha as an outpatient.   4. Stage 4 CKD - Baseline creatinine 3.7 - 3.8. Peaked at 4.60 on 07/20/2021. At 3.80 on admission and stable at 3.89 today. Followed by Dr. Moshe Cipro as an outpatient.    For questions or updates, please contact [place] Please consult [email or web address] for contact info under        Signed, Erma Heritage, PA-C  07/29/2021, 10:09 AM    Attending note  Patient seen and discussed with PA Ahmed Prima, I agree with her documentation. Being manged for acute on chronic systolic complicated by CKD 4. Net negative 1.7 L yesterday and 4 L since admission, he is on IV lasix 80mg  bid.Mild uptrend in Cr with diuresis however remains significantly volume overloaded, continue IV diuretics. Medical therapy for systolic HF limited by CKD 4, medication side effects, prior issues with orthostatic hypotension. Continue current regimen  Zandra Abts MD

## 2021-07-29 NOTE — Progress Notes (Signed)
PROGRESS NOTE    [name]  RSW:[id number] DOB: 1956/04/19 DOA: 07/24/2021 PCP: [name], MD     Brief Narrative:   66 y.o. WM PMHx malignant neoplasm of esophagus, essential hypertension, CAD, K9FG, combine systolic and diastolic heart failure, CKD stage IV, sleep apnea on BiPAP her last ED visit at [facility] 2/7 due to chest pain seen in the ED and discharged, presents W/ several weeks of worsening shortness of breath on lying flat.   In ED- tachypneic, BP was 174/103  Labw w/ anemia, BUN/creatinine 46/3.80 (creatinine within baseline range), CBG 229, BNP > 4500, troponin x2 - 36 > 39, magnesium 2.1, phosphorus 4.0.  Influenza A, B, SARS coronavirus 2 was negative.cxr-previous CABG.  Cardiomegaly,IV Lasix 60 mgx1 and admitted for chf exacerbation. Patient had clinical improvement diuretics.  Renal function with CKD remained stable. Bnp >4500-- 3538 w/ diuresis.  Seen by cardiology in consultation and managed with diuresis.  He had issues w/ elevated blood pressure  and some chest pain during hospitalization likely noncardiac, serial troponins are negative      Subjective: 2/16 afebrile overnight patient states feeling short of breath.  A/O x4.  Sitting in chair     Assessment & Plan: Covid vaccination; vaccinated 2/3 believes it was [employer].  States would like Bivalent booster.  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 -Overall all improving but slowly,Net IO Since Admission: -1,920 mL [07/27/21 1045] appreciate cardiology input increasing Lasix to 60 twice daily as he has ongoing fluid overload and edema.   -Continue home Coreg Cardura, BiDil increased to 3 times daily for additional BP control and chf. -Monitor I/O, daily weight, keep on salt and fluid restriction.  Cardiology consulted. His previous weight on January 25 was 95.6 kg-wt changes see below -Strict in and out -4.8 L, 2/16 - Daily weight Filed Weights   07/28/21 0511 07/28/21 1258 07/29/21 0530  Weight: 92.8 kg 94.2 kg 91.6 kg  -Coreg 12.5 mg BID - Cardura 4 mg daily - 2/15 Lasix IV increased to 80 mg BID -BiDil 20-37.5 mg TID -Per cardiology No ACE-I/ARB/ARNI/Spiro/SGLT2 inhibitor given his CKD.   Essential hypertension, benign- (present on admission) -Poorly controlled.  -Cardiology on board.  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   -Previous CABG 03/25/2019.   -Nuclear stress 9/22 mostly fixed basal to mild inferior defects, scar with minimal peri-infarct ischemia.  Continue diuresis hopefully with it symptoms will improve.   -Not on statin due to intolerance.   Elevated troponin- (present on admission) -No delta 40> 39, subtle elevation in the setting of CHF demand ischemia   Mixed hyperlipidemia- (present on admission) -Not on a statin due to intolerance.   Anemia  due to chronic kidney disease -Hemoglobin stable.monitor -2/15 anemia panel pending   Obstructive sleep apnea -Cont CPAP   DM type II with hyperglycemia  -2/12 hemoglobin A1c= 6.4 -Sensitive SSI  CKD (chronic kidney disease) stage 4, GFR 15-29 ml/min (POA)Baseline CR  3.5-4.   Lab Results  Component Value Date    CREATININE 3.89 (H) 07/29/2021   CREATININE 3.73 (H) 07/28/2021   CREATININE 3.78 (H) 07/27/2021   CREATININE 3.73 (H) 07/26/2021   CREATININE 3.71 (H) 07/25/2021  -Baseline -Followed by Nephrology Dr. Moshe Cipro as an outpatient.  Metabolic acidosis -Due to CKD.  Monitor bicarb.  .    DVT prophylaxis: Lovenox Code Status: Full Family Communication: 2/15 Status is: Inpatient    Dispo: The patient is from: Home              Anticipated d/c is to: Home              Anticipated d/c date is: > 3 days              Patient currently is not medically stable to d/c.      Consultants:   Cardiology  Procedures/Significant Events:    I have personally reviewed and interpreted all radiology studies and my findings are as above.  VENTILATOR SETTINGS:    Cultures   Antimicrobials:    Devices    LINES / TUBES:      Continuous Infusions:   Objective: Vitals:   07/28/21 1751 07/28/21 2123 07/29/21 0530 07/29/21 1215  BP: (!) 142/77 (!) 154/97 (!) 176/102 136/70  Pulse:  80 78 78  Resp:  18 18 16   Temp:  (!) 97.5 F (36.4 C) 97.9 F (36.6 C) 98 F (36.7 C)  TempSrc:  Oral Oral   SpO2:  100% 98% 96%  Weight:   91.6 kg   Height:        Intake/Output Summary (Last 24 hours) at 07/29/2021 1302 Last data filed at 07/29/2021 1100 Gross per 24 hour  Intake 600 ml  Output 2500 ml  Net -1900 ml    Filed Weights   07/28/21 0511 07/28/21 1258 07/29/21 0530  Weight: 92.8 kg 94.2 kg 91.6 kg    Examination:  General: A/O x4 No acute respiratory distress Eyes: negative scleral hemorrhage, negative anisocoria, negative icterus ENT: Negative Runny nose, negative gingival bleeding, Neck:  Negative scars, masses, torticollis, lymphadenopathy, JVD Lungs: Clear to auscultation bilaterally without wheezes or crackles Cardiovascular: Regular rate and rhythm without murmur gallop or rub normal S1 and S2 Abdomen: negative abdominal pain, nondistended, positive  soft, bowel sounds, no rebound, no ascites, no appreciable mass Extremities: No significant cyanosis, clubbing, or edema bilateral lower extremities Skin: Negative rashes, lesions, ulcers Psychiatric:  Negative depression, negative anxiety, negative fatigue, negative mania  Central nervous system:  Cranial nerves II through XII intact, tongue/uvula midline, all extremities muscle strength 5/5, sensation intact throughout, negative dysarthria, negative expressive aphasia, negative receptive aphasia.  .     Data Reviewed: Care during the described time interval was provided by me .  I have reviewed this patient's available data, including medical history, events of note, physical examination, and all test results as part of my evaluation.  CBC: Recent Labs  Lab 07/24/21 2338 07/25/21 0231 07/26/21 0449 07/29/21 0510  WBC 6.3 6.8 5.2 5.1  NEUTROABS 4.5  --   --  3.1  HGB 9.6* 8.8* 8.4* 8.6*  HCT 31.6* 29.6* 26.9* 29.4*  MCV 90.8 90.0 90.0 89.9  PLT 238  234 211 017    Basic Metabolic Panel: Recent Labs  Lab 07/25/21 0231 07/26/21 0449 07/27/21 0526 07/28/21 0555 07/29/21 0510  NA 139 138 139 139 139  K 3.4* 3.7 3.6 4.0 3.6  CL 110 109 112* 108 107  CO2 20* 18* 18* 18* 18*  GLUCOSE 173* 134* 154* 138* 141*  BUN 45* 44* 44* 44* 44*  CREATININE 3.71* 3.73* 3.78* 3.73* 3.89*  CALCIUM 8.3* 8.3* 8.3* 8.8* 8.5*  MG 2.1  --   --   --  2.0  PHOS 4.0  --   --   --  4.3    GFR: Estimated Creatinine Clearance: 21.8 mL/min (A) (by C-G formula based on SCr of 3.89 mg/dL (H)). Liver Function Tests: Recent Labs  Lab 07/24/21 2338 07/25/21 0231 07/29/21 0510  AST 13* 11* 9*  ALT 14 15 8   ALKPHOS 85 80 58  BILITOT 0.6 0.4 0.4  PROT 6.6 6.0* 5.9*  ALBUMIN 3.5 3.3* 3.3*    No results for input(s): LIPASE, AMYLASE in the last 168 hours. No results for input(s): AMMONIA in the last 168 hours. Coagulation Profile: No results for input(s): INR, PROTIME in the last 168  hours. Cardiac Enzymes: No results for input(s): CKTOTAL, CKMB, CKMBINDEX, TROPONINI in the last 168 hours. BNP (last 3 results) No results for input(s): PROBNP in the last 8760 hours. HbA1C: No results for input(s): HGBA1C in the last 72 hours. CBG: Recent Labs  Lab 07/28/21 1111 07/28/21 1651 07/28/21 2139 07/29/21 0743 07/29/21 1121  GLUCAP 180* 142* 165* 126* 229*    Lipid Profile: No results for input(s): CHOL, HDL, LDLCALC, TRIG, CHOLHDL, LDLDIRECT in the last 72 hours. Thyroid Function Tests: No results for input(s): TSH, T4TOTAL, FREET4, T3FREE, THYROIDAB in the last 72 hours. Anemia Panel: Recent Labs    07/29/21 0510  VITAMINB12 432  FOLATE 6.6  FERRITIN 148  TIBC 264  IRON 32*  RETICCTPCT 2.1   Sepsis Labs: No results for input(s): PROCALCITON, LATICACIDVEN in the last 168 hours.  Recent Results (from the past 240 hour(s))  Resp Panel by RT-PCR (Flu A&B, Covid) Nasopharyngeal Swab     Status: None   Collection Time: 07/24/21 11:43 PM   Specimen: Nasopharyngeal Swab; Nasopharyngeal(NP) swabs in vial transport medium  Result Value Ref Range Status   SARS Coronavirus 2 by RT PCR NEGATIVE NEGATIVE Final    Comment: (NOTE) SARS-CoV-2 target nucleic acids are NOT DETECTED.  The SARS-CoV-2 RNA is generally detectable in upper respiratory specimens during the acute phase of infection. The lowest concentration of SARS-CoV-2 viral copies this assay can detect is 138 copies/mL. A negative result does not preclude SARS-Cov-2 infection and should not be used as the sole basis for treatment or other patient management decisions. A negative result may occur with  improper specimen collection/handling, submission of specimen other than nasopharyngeal swab, presence of viral mutation(s) within the areas targeted by this assay, and inadequate number of viral copies(<138 copies/mL). A negative result must be combined with clinical observations, patient history, and  epidemiological information. The expected result is Negative.  Fact Sheet for Patients:  EntrepreneurPulse.com.au  Fact Sheet for Healthcare Providers:  IncredibleEmployment.be  This test is no t yet approved or cleared by the [place] FDA and  has been authorized for detection and/or diagnosis of SARS-CoV-2 by FDA under an Emergency Use Authorization (EUA). This EUA will remain  in effect (meaning this test can be used) for the duration of the COVID-19 declaration under Section 564(b)(1)  of the Act, 21 U.S.C.section 360bbb-3(b)(1), unless the authorization is terminated  or revoked sooner.       Influenza A by PCR NEGATIVE NEGATIVE Final   Influenza B by PCR NEGATIVE NEGATIVE Final    Comment: (NOTE) The Xpert Xpress SARS-CoV-2/FLU/RSV plus assay is intended as an aid in the diagnosis of influenza from Nasopharyngeal swab specimens and should not be used as a sole basis for treatment. Nasal washings and aspirates are unacceptable for Xpert Xpress SARS-CoV-2/FLU/RSV testing.  Fact Sheet for Patients: EntrepreneurPulse.com.au  Fact Sheet for Healthcare Providers: IncredibleEmployment.be  This test is not yet approved or cleared by the [place] FDA and has been authorized for detection and/or diagnosis of SARS-CoV-2 by FDA under an Emergency Use Authorization (EUA). This EUA will remain in effect (meaning this test can be used) for the duration of the COVID-19 declaration under Section 564(b)(1) of the Act, 21 U.S.C. section 360bbb-3(b)(1), unless the authorization is terminated or revoked.  Performed at [facility], [address] [place], [place] [address]           Radiology Studies: No results found.      Scheduled Meds:  aspirin EC  324 mg Oral Daily   carvedilol  12.5 mg Oral BID   COVID-19 mRNA bivalent vaccine (Pfizer)  0.3 mL Intramuscular Once   doxazosin  4 mg Oral  Daily   enoxaparin (LOVENOX) injection  30 mg Subcutaneous Q24H   furosemide  80 mg Intravenous Q12H   insulin aspart  0-5 Units Subcutaneous QHS   insulin aspart  0-9 Units Subcutaneous TID WC   isosorbide-hydrALAZINE  1 tablet Oral TID   metoCLOPramide  10 mg Oral TID AC   multivitamin with minerals  1 tablet Oral Daily   pantoprazole  40 mg Oral Daily   potassium chloride  10 mEq Oral Daily   sertraline  100 mg Oral Daily   Continuous Infusions:   LOS: 4 days    Time spent:40 min    Damontae Loppnow, Geraldo Docker, MD [facility]   If 7PM-7AM, please contact night-coverage 07/29/2021, 1:02 PM

## 2021-07-29 NOTE — Progress Notes (Signed)
pt developed blistered area to recent skin graft he states it is painful. No drainage at this time. MD made aware.

## 2021-07-30 DIAGNOSIS — N184 Chronic kidney disease, stage 4 (severe): Secondary | ICD-10-CM | POA: Diagnosis not present

## 2021-07-30 DIAGNOSIS — I5043 Acute on chronic combined systolic (congestive) and diastolic (congestive) heart failure: Secondary | ICD-10-CM | POA: Diagnosis not present

## 2021-07-30 DIAGNOSIS — I10 Essential (primary) hypertension: Secondary | ICD-10-CM | POA: Diagnosis not present

## 2021-07-30 DIAGNOSIS — I25708 Atherosclerosis of coronary artery bypass graft(s), unspecified, with other forms of angina pectoris: Secondary | ICD-10-CM | POA: Diagnosis not present

## 2021-07-30 DIAGNOSIS — N1832 Chronic kidney disease, stage 3b: Secondary | ICD-10-CM | POA: Diagnosis not present

## 2021-07-30 DIAGNOSIS — I2510 Atherosclerotic heart disease of native coronary artery without angina pectoris: Secondary | ICD-10-CM | POA: Diagnosis not present

## 2021-07-30 LAB — CBC WITH DIFFERENTIAL/PLATELET
Abs Immature Granulocytes: 0.01 10*3/uL (ref 0.00–0.07)
Basophils Absolute: 0.1 10*3/uL (ref 0.0–0.1)
Basophils Relative: 2 %
Eosinophils Absolute: 0.3 10*3/uL (ref 0.0–0.5)
Eosinophils Relative: 5 %
HCT: 28.8 % — ABNORMAL LOW (ref 39.0–52.0)
Hemoglobin: 8.6 g/dL — ABNORMAL LOW (ref 13.0–17.0)
Immature Granulocytes: 0 %
Lymphocytes Relative: 21 %
Lymphs Abs: 1 10*3/uL (ref 0.7–4.0)
MCH: 26.7 pg (ref 26.0–34.0)
MCHC: 29.9 g/dL — ABNORMAL LOW (ref 30.0–36.0)
MCV: 89.4 fL (ref 80.0–100.0)
Monocytes Absolute: 0.6 10*3/uL (ref 0.1–1.0)
Monocytes Relative: 13 %
Neutro Abs: 2.9 10*3/uL (ref 1.7–7.7)
Neutrophils Relative %: 59 %
Platelets: 217 10*3/uL (ref 150–400)
RBC: 3.22 MIL/uL — ABNORMAL LOW (ref 4.22–5.81)
RDW: 17 % — ABNORMAL HIGH (ref 11.5–15.5)
WBC: 4.9 10*3/uL (ref 4.0–10.5)
nRBC: 0 % (ref 0.0–0.2)

## 2021-07-30 LAB — COMPREHENSIVE METABOLIC PANEL
ALT: 8 U/L (ref 0–44)
AST: 8 U/L — ABNORMAL LOW (ref 15–41)
Albumin: 3.3 g/dL — ABNORMAL LOW (ref 3.5–5.0)
Alkaline Phosphatase: 57 U/L (ref 38–126)
Anion gap: 12 (ref 5–15)
BUN: 43 mg/dL — ABNORMAL HIGH (ref 8–23)
CO2: 18 mmol/L — ABNORMAL LOW (ref 22–32)
Calcium: 8.5 mg/dL — ABNORMAL LOW (ref 8.9–10.3)
Chloride: 110 mmol/L (ref 98–111)
Creatinine, Ser: 3.8 mg/dL — ABNORMAL HIGH (ref 0.61–1.24)
GFR, Estimated: 17 mL/min — ABNORMAL LOW (ref >60)
Glucose, Bld: 124 mg/dL — ABNORMAL HIGH (ref 70–99)
Potassium: 3.8 mmol/L (ref 3.5–5.1)
Sodium: 140 mmol/L (ref 135–145)
Total Bilirubin: 0.5 mg/dL (ref 0.3–1.2)
Total Protein: 5.8 g/dL — ABNORMAL LOW (ref 6.5–8.1)

## 2021-07-30 LAB — MAGNESIUM: Magnesium: 2.1 mg/dL (ref 1.7–2.4)

## 2021-07-30 LAB — GLUCOSE, CAPILLARY
Glucose-Capillary: 119 mg/dL — ABNORMAL HIGH (ref 70–99)
Glucose-Capillary: 189 mg/dL — ABNORMAL HIGH (ref 70–99)
Glucose-Capillary: 219 mg/dL — ABNORMAL HIGH (ref 70–99)
Glucose-Capillary: 243 mg/dL — ABNORMAL HIGH (ref 70–99)

## 2021-07-30 LAB — PHOSPHORUS: Phosphorus: 4.1 mg/dL (ref 2.5–4.6)

## 2021-07-30 MED ORDER — HYDRALAZINE HCL 20 MG/ML IJ SOLN
5.0000 mg | Freq: Four times a day (QID) | INTRAMUSCULAR | Status: DC | PRN
  Filled 2021-07-30: qty 1

## 2021-07-30 NOTE — Progress Notes (Signed)
PROGRESS NOTE    [name]  PFX:[id number] DOB: [birth year] DOA: 07/24/2021 PCP: [name], MD     Brief Narrative:   66 y.o. WM PMHx malignant neoplasm of esophagus, essential hypertension, CAD, H2DJ, combine systolic and diastolic heart failure, CKD stage IV, sleep apnea on BiPAP her last ED visit at [facility] 2/7 due to chest pain seen in the ED and discharged, presents W/ several weeks of worsening shortness of breath on lying flat.   In ED- tachypneic, BP was 174/103  Labw w/ anemia, BUN/creatinine 46/3.80 (creatinine within baseline range), CBG 229, BNP > 4500, troponin x2 - 36 > 39, magnesium 2.1, phosphorus 4.0.  Influenza A, B, SARS coronavirus 2 was negative.cxr-previous CABG.  Cardiomegaly,IV Lasix 60 mgx1 and admitted for chf exacerbation. Patient had clinical improvement diuretics.  Renal function with CKD remained stable. Bnp >4500-- 3538 w/ diuresis.  Seen by cardiology in consultation and managed with diuresis.  He had issues w/ elevated blood pressure  and some chest pain during hospitalization likely noncardiac, serial troponins are negative      Subjective: 2/17 afebrile overnight, BP has trended up     Assessment & Plan: Covid vaccination; vaccinated 2/3 believes it was [employer].  States would like Bivalent booster.  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 -Overall all improving but slowly,Net IO Since Admission: -1,920 mL [07/27/21 1045] appreciate cardiology input increasing Lasix to 60 twice daily as he has ongoing fluid overload and edema.   -Continue home Coreg Cardura, BiDil increased to 3 times daily for additional BP control and chf. -Monitor I/O, daily weight, keep on salt and fluid restriction.  Cardiology consulted. His previous weight on January 25 was 95.6 kg-wt changes see below -Strict in and out -5.6 L, 2/17 - Daily weight Filed Weights   07/28/21 1258 07/29/21 0530 07/30/21 0500  Weight: 94.2 kg 91.6 kg 90.2 kg  -Coreg 12.5 mg BID - Cardura 4 mg daily - 2/15 Lasix IV increased to 80 mg BID -BiDil 20-37.5 mg TID -2/17 Hydralazine PRN SBP> 160 or DBP> 100 -217 cardiology would like to continue to diurese. -Per cardiology No ACE-I/ARB/ARNI/Spiro/SGLT2 inhibitor given his CKD.   Essential hypertension, benign- (present on admission) -Poorly controlled.  -Cardiology on board.  We will help with adjustments of medication - See CHF    CAD involving coronary bypass graft of native heart with other forms of angina pectoris ([place])- (present on admission) -Chest pain overnight but reproducible.   -Previous CABG 03/25/2019.   -Nuclear stress 9/22 mostly fixed basal to mild inferior defects, scar with minimal peri-infarct ischemia.  Continue diuresis hopefully with it symptoms will improve.   -Not on statin due to intolerance.   Elevated troponin- (present on admission) -No delta 40> 39, subtle elevation in the setting of CHF demand ischemia   Mixed hyperlipidemia- (present on admission) -Not on a  statin due to intolerance.   Anemia due to chronic kidney disease - Monitor closely - Transfuse for hemoglobin<7 Lab Results  Component Value Date   HGB 8.6 (L) 07/30/2021   HGB 8.6 (L) 07/29/2021   HGB 8.4 (L) 07/26/2021   HGB 8.8 (L) 07/25/2021   HGB 9.6 (L) 07/24/2021  -2/15 anemia panel  pending   Obstructive sleep apnea -Cont CPAP   DM type II with hyperglycemia  -2/12 hemoglobin A1c= 6.4 -Sensitive SSI  CKD (chronic kidney disease) stage 4, GFR 15-29 ml/min (POA)Baseline CR  3.5-4.   Lab Results  Component Value Date   CREATININE 3.80 (H) 07/30/2021   CREATININE 3.89 (H) 07/29/2021   CREATININE 3.73 (H) 07/28/2021   CREATININE 3.78 (H) 07/27/2021   CREATININE 3.73 (H) 07/26/2021  -Baseline -Followed by Nephrology Dr. Moshe Cipro as an outpatient.  Metabolic acidosis -Due to CKD.  Monitor bicarb.  .    DVT prophylaxis: Lovenox Code Status: Full Family Communication: 2/15 Status is: Inpatient    Dispo: The patient is from: Home              Anticipated d/c is to: Home              Anticipated d/c date is: > 3 days              Patient currently is not medically stable to d/c.      Consultants:   Cardiology  Procedures/Significant Events:    I have personally reviewed and interpreted all radiology studies and my findings are as above.  VENTILATOR SETTINGS:    Cultures   Antimicrobials:    Devices    LINES / TUBES:      Continuous Infusions:   Objective: Vitals:   07/29/21 1215 07/29/21 2121 07/30/21 0500 07/30/21 0540  BP: 136/70 (!) 152/104  (!) 189/114  Pulse: 78 71  81  Resp: 16   19  Temp: 98 F (36.7 C) 97.6 F (36.4 C)  97.7 F (36.5 C)  TempSrc:  Oral  Oral  SpO2: 96% 100%  98%  Weight:   90.2 kg   Height:        Intake/Output Summary (Last 24 hours) at 07/30/2021 0855 Last data filed at 07/30/2021 0500 Gross per 24 hour  Intake 1080 ml  Output 2650 ml  Net -1570 ml    Filed Weights   07/28/21 1258 07/29/21 0530 07/30/21 0500  Weight: 94.2 kg 91.6 kg 90.2 kg    Examination:  General: A/O x4 No acute respiratory distress Eyes: negative scleral hemorrhage, negative anisocoria, negative icterus ENT: Negative Runny nose, negative gingival bleeding, Neck:  Negative scars, masses, torticollis,  lymphadenopathy, JVD Lungs: Clear to auscultation bilaterally without wheezes or crackles Cardiovascular: Regular rate and rhythm without murmur gallop or rub normal S1 and S2 Abdomen: negative abdominal pain, nondistended, positive soft, bowel sounds, no rebound, no ascites, no appreciable mass Extremities: No significant cyanosis, clubbing, or edema bilateral lower extremities Skin: Negative rashes, lesions, ulcers Psychiatric:  Negative depression, negative anxiety, negative fatigue, negative mania  Central nervous system:  Cranial nerves II through XII intact, tongue/uvula midline, all extremities muscle strength 5/5, sensation intact throughout, negative dysarthria, negative expressive aphasia, negative receptive aphasia.  .     Data Reviewed: Care during the described time interval was provided by me .  I have reviewed this patient's available data, including medical history, events of note, physical examination, and all test results as part of my evaluation.  CBC: Recent Labs  Lab 07/24/21 2338 07/25/21 0231 07/26/21 0449 07/29/21 0510 07/30/21 0443  WBC 6.3 6.8 5.2 5.1 4.9  NEUTROABS 4.5  --   --  3.1 2.9  HGB 9.6* 8.8* 8.4* 8.6* 8.6*  HCT 31.6* 29.6* 26.9* 29.4* 28.8*  MCV 90.8 90.0 90.0 89.9 89.4  PLT 238 234 211 221 921    Basic Metabolic Panel: Recent Labs  Lab 07/25/21 0231 07/26/21 0449 07/27/21 0526 07/28/21 0555 07/29/21 0510 07/30/21 0443  NA 139 138 139 139 139 140  K 3.4* 3.7 3.6 4.0 3.6 3.8  CL 110 109 112* 108 107 110  CO2 20* 18* 18* 18* 18* 18*  GLUCOSE 173* 134* 154* 138* 141* 124*  BUN 45* 44* 44* 44* 44* 43*  CREATININE 3.71* 3.73* 3.78* 3.73* 3.89* 3.80*  CALCIUM 8.3* 8.3* 8.3* 8.8* 8.5* 8.5*  MG 2.1  --   --   --  2.0 2.1  PHOS 4.0  --   --   --  4.3 4.1    GFR: Estimated Creatinine Clearance: 22.2 mL/min (A) (by C-G formula based on SCr of 3.8 mg/dL (H)). Liver Function Tests: Recent Labs  Lab 07/24/21 2338 07/25/21 0231  07/29/21 0510 07/30/21 0443  AST 13* 11* 9* 8*  ALT 14 15 8 8   ALKPHOS 85 80 58 57  BILITOT 0.6 0.4 0.4 0.5  PROT 6.6 6.0* 5.9* 5.8*  ALBUMIN 3.5 3.3* 3.3* 3.3*    No results for input(s): LIPASE, AMYLASE in the last 168 hours. No results for input(s): AMMONIA in the last 168 hours. Coagulation Profile: No results for input(s): INR, PROTIME in the last 168 hours. Cardiac Enzymes: No results for input(s): CKTOTAL, CKMB, CKMBINDEX, TROPONINI in the last 168 hours. BNP (last 3 results) No results for input(s): PROBNP in the last 8760 hours. HbA1C: No results for input(s): HGBA1C in the last 72 hours. CBG: Recent Labs  Lab 07/29/21 0743 07/29/21 1121 07/29/21 1653 07/29/21 2116 07/30/21 0741  GLUCAP 126* 229* 191* 135* 119*    Lipid Profile: No results for input(s): CHOL, HDL, LDLCALC, TRIG, CHOLHDL, LDLDIRECT in the last 72 hours. Thyroid Function Tests: No results for input(s): TSH, T4TOTAL, FREET4, T3FREE, THYROIDAB in the last 72 hours. Anemia Panel: Recent Labs    07/29/21 0510  VITAMINB12 432  FOLATE 6.6  FERRITIN 148  TIBC 264  IRON 32*  RETICCTPCT 2.1    Sepsis Labs: No results for input(s): PROCALCITON, LATICACIDVEN in the last 168 hours.  Recent Results (from the past 240 hour(s))  Resp Panel by RT-PCR (Flu A&B, Covid) Nasopharyngeal Swab     Status: None   Collection Time: 07/24/21 11:43 PM   Specimen: Nasopharyngeal Swab; Nasopharyngeal(NP) swabs in vial transport medium  Result Value Ref Range Status   SARS Coronavirus 2 by RT PCR NEGATIVE NEGATIVE Final    Comment: (NOTE) SARS-CoV-2 target nucleic acids are NOT DETECTED.  The SARS-CoV-2 RNA is generally detectable in upper respiratory specimens during the acute phase of infection. The lowest concentration of SARS-CoV-2 viral copies this assay can detect is 138 copies/mL. A negative result does not preclude SARS-Cov-2 infection and should not be used as the sole basis for treatment or other  patient management decisions. A negative result may occur with  improper specimen collection/handling, submission of specimen other than nasopharyngeal swab, presence of viral mutation(s) within the areas targeted by this assay, and inadequate number of viral copies(<138 copies/mL). A negative result must be combined with clinical observations, patient history, and epidemiological information. The expected  result is Negative.  Fact Sheet for Patients:  EntrepreneurPulse.com.au  Fact Sheet for Healthcare Providers:  IncredibleEmployment.be  This test is no t yet approved or cleared by the [place] FDA and  has been authorized for detection and/or diagnosis of SARS-CoV-2 by FDA under an Emergency Use Authorization (EUA). This EUA will remain  in effect (meaning this test can be used) for the duration of the COVID-19 declaration under Section 564(b)(1) of the Act, 21 U.S.C.section 360bbb-3(b)(1), unless the authorization is terminated  or revoked sooner.       Influenza A by PCR NEGATIVE NEGATIVE Final   Influenza B by PCR NEGATIVE NEGATIVE Final    Comment: (NOTE) The Xpert Xpress SARS-CoV-2/FLU/RSV plus assay is intended as an aid in the diagnosis of influenza from Nasopharyngeal swab specimens and should not be used as a sole basis for treatment. Nasal washings and aspirates are unacceptable for Xpert Xpress SARS-CoV-2/FLU/RSV testing.  Fact Sheet for Patients: EntrepreneurPulse.com.au  Fact Sheet for Healthcare Providers: IncredibleEmployment.be  This test is not yet approved or cleared by the [place] FDA and has been authorized for detection and/or diagnosis of SARS-CoV-2 by FDA under an Emergency Use Authorization (EUA). This EUA will remain in effect (meaning this test can be used) for the duration of the COVID-19 declaration under Section 564(b)(1) of the Act, 21 U.S.C. section  360bbb-3(b)(1), unless the authorization is terminated or revoked.  Performed at [facility], [address] [place], [place] [address]           Radiology Studies: No results found.      Scheduled Meds:  aspirin EC  324 mg Oral Daily   bacitracin   Topical BID   carvedilol  12.5 mg Oral BID   COVID-19 mRNA bivalent vaccine (Pfizer)  0.3 mL Intramuscular Once   doxazosin  4 mg Oral Daily   enoxaparin (LOVENOX) injection  30 mg Subcutaneous Q24H   furosemide  80 mg Intravenous Q12H   insulin aspart  0-5 Units Subcutaneous QHS   insulin aspart  0-9 Units Subcutaneous TID WC   isosorbide-hydrALAZINE  1 tablet Oral TID   metoCLOPramide  10 mg Oral TID AC   multivitamin with minerals  1 tablet Oral Daily   pantoprazole  40 mg Oral Daily   potassium chloride  10 mEq Oral Daily   sertraline  100 mg Oral Daily   Continuous Infusions:   LOS: 5 days    Time spent:40 min    Diavian Furgason, Geraldo Docker, MD [facility]   If 7PM-7AM, please contact night-coverage 07/30/2021, 8:55 AM

## 2021-07-30 NOTE — Care Management Important Message (Signed)
Important Message  Patient Details  Name: [name] MRN: [id number] Date of Birth: Feb 27, 1956   Medicare Important Message Given:  Yes     Tommy Medal 07/30/2021, 1:17 PM

## 2021-07-30 NOTE — Progress Notes (Addendum)
Progress Note  Patient Name: [name] Date of Encounter: 07/30/2021  [facility] Cardiologist: Skeet Latch, MD   Subjective   Breathing improved from yesterday. No chest pain or palpitations. Still with mild lower extremity edema.   Inpatient Medications    Scheduled Meds:  aspirin EC  324 mg Oral Daily   bacitracin   Topical BID   carvedilol  12.5 mg Oral BID   COVID-19 mRNA bivalent vaccine (Pfizer)  0.3 mL Intramuscular Once   doxazosin  4 mg Oral Daily   enoxaparin (LOVENOX) injection  30 mg Subcutaneous Q24H   furosemide  80 mg Intravenous Q12H   insulin aspart  0-5 Units Subcutaneous QHS   insulin aspart  0-9 Units Subcutaneous TID WC   isosorbide-hydrALAZINE  1 tablet Oral TID   metoCLOPramide  10 mg Oral TID AC   multivitamin with minerals  1 tablet Oral Daily   pantoprazole  40 mg Oral Daily   potassium chloride  10 mEq Oral Daily   sertraline  100 mg Oral Daily   Continuous Infusions:  PRN Meds: acetaminophen, albuterol, guaiFENesin, hydrALAZINE, nitroGLYCERIN, prochlorperazine, traMADol   Vital Signs    Vitals:   07/29/21 1215 07/29/21 2121 07/30/21 0500 07/30/21 0540  BP: 136/70 (!) 152/104  (!) 189/114  Pulse: 78 71  81  Resp: 16   19  Temp: 98 F (36.7 C) 97.6 F (36.4 C)  97.7 F (36.5 C)  TempSrc:  Oral  Oral  SpO2: 96% 100%  98%  Weight:   90.2 kg   Height:        Intake/Output Summary (Last 24 hours) at 07/30/2021 1003 Last data filed at 07/30/2021 0500 Gross per 24 hour  Intake 720 ml  Output 2050 ml  Net -1330 ml   Last 3 Weights 07/30/2021 07/29/2021 07/28/2021  Weight (lbs) 198 lb 13.7 oz 201 lb 15.1 oz 207 lb 9.6 oz  Weight (kg) 90.2 kg 91.6 kg 94.167 kg      Telemetry    NSR, HR in 60's to 70's with occasional PVC's.  - Personally Reviewed  ECG    No new tracings.   Physical Exam   GEN: Pleasant male appearing in no acute distress.   Neck: No JVD Cardiac: RRR, no murmurs, rubs, or gallops.  Respiratory:  Mild rales along left base.  GI: Soft, nontender, non-distended  MS: Trace lower extremity edema; Right leg wrapped.  Neuro:  Nonfocal  Psych: Normal affect   Labs    High Sensitivity Troponin:   Recent Labs  Lab 07/25/21 0231 07/25/21 0538 07/25/21 0714 07/27/21 0526 07/27/21 0711  TROPONINIHS 39* 40* 39* 28* 27*     Chemistry Recent Labs  Lab 07/25/21 0231 07/26/21 0449 07/28/21 0555 07/29/21 0510 07/30/21 0443  NA 139   < > 139 139 140  K 3.4*   < > 4.0 3.6 3.8  CL 110   < > 108 107 110  CO2 20*   < > 18* 18* 18*  GLUCOSE 173*   < > 138* 141* 124*  BUN 45*   < > 44* 44* 43*  CREATININE 3.71*   < > 3.73* 3.89* 3.80*  CALCIUM 8.3*   < > 8.8* 8.5* 8.5*  MG 2.1  --   --  2.0 2.1  PROT 6.0*  --   --  5.9* 5.8*  ALBUMIN 3.3*  --   --  3.3* 3.3*  AST 11*  --   --  9* 8*  ALT 15  --   --  8 8  ALKPHOS 80  --   --  58 57  BILITOT 0.4  --   --  0.4 0.5  GFRNONAA 17*   < > 17* 16* 17*  ANIONGAP 9   < > 13 14 12    < > = values in this interval not displayed.    Lipids No results for input(s): CHOL, TRIG, HDL, LABVLDL, LDLCALC, CHOLHDL in the last 168 hours.  Hematology Recent Labs  Lab 07/26/21 0449 07/29/21 0510 07/30/21 0443  WBC 5.2 5.1 4.9  RBC 2.99* 3.27*   3.23* 3.22*  HGB 8.4* 8.6* 8.6*  HCT 26.9* 29.4* 28.8*  MCV 90.0 89.9 89.4  MCH 28.1 26.3 26.7  MCHC 31.2 29.3* 29.9*  RDW 17.4* 17.1* 17.0*  PLT 211 221 217   Thyroid No results for input(s): TSH, FREET4 in the last 168 hours.  BNP Recent Labs  Lab 07/24/21 2338 07/26/21 0449  BNP >4,500.0* 3,538.0*    DDimer No results for input(s): DDIMER in the last 168 hours.   Radiology    No results found.  Cardiac Studies   Echo: 07/25/2021 IMPRESSIONS     1. Left ventricular ejection fraction, by estimation, is 40 to 45%. The  left ventricle has mildly decreased function. The left ventricle  demonstrates regional wall motion abnormalities (see scoring  diagram/findings for description).  There is mild  concentric left ventricular hypertrophy. Left ventricular diastolic  function could not be evaluated. There is akinesis of the left  ventricular, basal inferior wall and inferoseptal wall.   2. Right ventricular systolic function is moderately reduced. The right  ventricular size is severely enlarged. There is moderately elevated  pulmonary artery systolic pressure. The estimated right ventricular  systolic pressure is 93.2 mmHg.   3. Left atrial size was moderately dilated.   4. The mitral valve is degenerative. Mild to moderate mitral valve  regurgitation. No evidence of mitral stenosis.   5. The aortic valve is normal in structure. Aortic valve regurgitation is  not visualized. No aortic stenosis is present.   6. Aortic dilatation noted. There is mild dilatation of the aortic root,  measuring 39 mm.   7. The inferior vena cava is dilated in size with <50% respiratory  variability, suggesting right atrial pressure of 15 mmHg.   Patient Profile     66 y.o. 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   Assessment & Plan   1. Acute HFmrEF - EF was at 45-50% in 01/2021 and at 40-45% this admission. He has been responding well to IV Lasix with a recorded output of -5.6L thus far and weight down from 209 lbs to 198 lbs. Creatinine stable at 3.80. Would continue IV diuresis today given stable renal function. And continued volume increase    When he does transition to oral would switch to torsemide for better absorption- Remains on Coreg and Bi-Dil for his cardiomyopathy. No ACE-I/ARB/ARNI/Spiro/SGLT2 inhibitor given his CKD.    2. HTN - [facility]. BP has been variable from 136/70 - 189/114 within the past 24 hours. Remains on Coreg 12.5mg  BID, Bi-Dil 20-37.5mg  TID and  Cardura 4mg  daily. Previously had worsening orthostasis with higher doses of Cardura. If BP remains elevated, would attempt to titrate  Coreg to 18.75mg  BID.    3. CAD - He is s/p CABG in 03/2019. NST in 02/2021 showed fixed defects with minimal peri-infarct ischemia. Medical management has been recommended as he is not a cath candidate given his CKD.  - Continue ASA and Coreg. He has been intolerant to statins and is on Repatha.   4. Stage 4 CKD - Baseline creatinine 3.7 - 3.8. Peaked at 4.60 on 07/20/2021. Creatinine was at 3.80 on admission and remains stable at 3.80 today.   For questions or updates, please contact [place] Please consult [email or web address] for contact info under       Signed, Erma Heritage, PA-C  07/30/2021, 10:03 AM    Pt seen and examined   I have amended note above by BStrader Pt just finished bathing    Appears tired. Neck:   JVP is increased    Lungs with mild rales Cardiac exam   RRR   No S3  no murmurs Abd benign Ext with 1+ edema feet  Continue IV diuresis for now since volume still increased    Strict I/O   Renal fundtion is limiting  diuresis.     Torsemide would be my recomm when he does transition to po.  Dorris Carnes MD

## 2021-07-31 DIAGNOSIS — I5043 Acute on chronic combined systolic (congestive) and diastolic (congestive) heart failure: Secondary | ICD-10-CM | POA: Diagnosis not present

## 2021-07-31 DIAGNOSIS — N1832 Chronic kidney disease, stage 3b: Secondary | ICD-10-CM | POA: Diagnosis not present

## 2021-07-31 DIAGNOSIS — N184 Chronic kidney disease, stage 4 (severe): Secondary | ICD-10-CM | POA: Diagnosis not present

## 2021-07-31 DIAGNOSIS — I25708 Atherosclerosis of coronary artery bypass graft(s), unspecified, with other forms of angina pectoris: Secondary | ICD-10-CM | POA: Diagnosis not present

## 2021-07-31 LAB — CBC WITH DIFFERENTIAL/PLATELET
Abs Immature Granulocytes: 0.02 10*3/uL (ref 0.00–0.07)
Basophils Absolute: 0.1 10*3/uL (ref 0.0–0.1)
Basophils Relative: 2 %
Eosinophils Absolute: 0.3 10*3/uL (ref 0.0–0.5)
Eosinophils Relative: 5 %
HCT: 27.5 % — ABNORMAL LOW (ref 39.0–52.0)
Hemoglobin: 8.4 g/dL — ABNORMAL LOW (ref 13.0–17.0)
Immature Granulocytes: 0 %
Lymphocytes Relative: 18 %
Lymphs Abs: 1.1 10*3/uL (ref 0.7–4.0)
MCH: 27.3 pg (ref 26.0–34.0)
MCHC: 30.5 g/dL (ref 30.0–36.0)
MCV: 89.3 fL (ref 80.0–100.0)
Monocytes Absolute: 0.7 10*3/uL (ref 0.1–1.0)
Monocytes Relative: 11 %
Neutro Abs: 4 10*3/uL (ref 1.7–7.7)
Neutrophils Relative %: 64 %
Platelets: 217 10*3/uL (ref 150–400)
RBC: 3.08 MIL/uL — ABNORMAL LOW (ref 4.22–5.81)
RDW: 16.9 % — ABNORMAL HIGH (ref 11.5–15.5)
WBC: 6.1 10*3/uL (ref 4.0–10.5)
nRBC: 0 % (ref 0.0–0.2)

## 2021-07-31 LAB — COMPREHENSIVE METABOLIC PANEL
ALT: 9 U/L (ref 0–44)
AST: 9 U/L — ABNORMAL LOW (ref 15–41)
Albumin: 3.3 g/dL — ABNORMAL LOW (ref 3.5–5.0)
Alkaline Phosphatase: 59 U/L (ref 38–126)
Anion gap: 11 (ref 5–15)
BUN: 43 mg/dL — ABNORMAL HIGH (ref 8–23)
CO2: 19 mmol/L — ABNORMAL LOW (ref 22–32)
Calcium: 8.5 mg/dL — ABNORMAL LOW (ref 8.9–10.3)
Chloride: 108 mmol/L (ref 98–111)
Creatinine, Ser: 3.67 mg/dL — ABNORMAL HIGH (ref 0.61–1.24)
GFR, Estimated: 18 mL/min — ABNORMAL LOW (ref >60)
Glucose, Bld: 140 mg/dL — ABNORMAL HIGH (ref 70–99)
Potassium: 3.7 mmol/L (ref 3.5–5.1)
Sodium: 138 mmol/L (ref 135–145)
Total Bilirubin: 0.5 mg/dL (ref 0.3–1.2)
Total Protein: 5.9 g/dL — ABNORMAL LOW (ref 6.5–8.1)

## 2021-07-31 LAB — GLUCOSE, CAPILLARY
Glucose-Capillary: 130 mg/dL — ABNORMAL HIGH (ref 70–99)
Glucose-Capillary: 162 mg/dL — ABNORMAL HIGH (ref 70–99)
Glucose-Capillary: 201 mg/dL — ABNORMAL HIGH (ref 70–99)
Glucose-Capillary: 136 mg/dL — ABNORMAL HIGH (ref 70–99)

## 2021-07-31 LAB — PHOSPHORUS: Phosphorus: 4.1 mg/dL (ref 2.5–4.6)

## 2021-07-31 LAB — MAGNESIUM: Magnesium: 2.1 mg/dL (ref 1.7–2.4)

## 2021-07-31 MED ORDER — ASCORBIC ACID 500 MG PO TABS
500.0000 mg | ORAL_TABLET | Freq: Every day | ORAL | Status: DC
  Administered 2021-07-31 – 2021-08-02 (×3): 500 mg via ORAL
  Filled 2021-07-31 (×3): qty 1

## 2021-07-31 MED ORDER — FERROUS SULFATE 325 (65 FE) MG PO TABS
325.0000 mg | ORAL_TABLET | Freq: Two times a day (BID) | ORAL | Status: DC
Start: 2021-07-31 — End: 2021-08-02
  Administered 2021-07-31 – 2021-08-02 (×4): 325 mg via ORAL
  Filled 2021-07-31 (×4): qty 1

## 2021-07-31 MED ORDER — PANTOPRAZOLE SODIUM 40 MG PO TBEC
40.0000 mg | DELAYED_RELEASE_TABLET | Freq: Two times a day (BID) | ORAL | Status: DC
  Administered 2021-07-31 – 2021-08-02 (×4): 40 mg via ORAL
  Filled 2021-07-31 (×4): qty 1

## 2021-07-31 NOTE — Progress Notes (Signed)
PROGRESS NOTE    [name]  HEN:[id number] DOB: 1956/04/03 DOA: 07/24/2021 PCP: [name], MD     Brief Narrative:   66 y.o. WM PMHx malignant neoplasm of esophagus, essential hypertension, CAD, T6RW, combine systolic and diastolic heart failure, CKD stage IV, sleep apnea on BiPAP her last ED visit at [facility] 2/7 due to chest pain seen in the ED and discharged, presents W/ several weeks of worsening shortness of breath on lying flat.   In ED- tachypneic, BP was 174/103  Labw w/ anemia, BUN/creatinine 46/3.80 (creatinine within baseline range), CBG 229, BNP > 4500, troponin x2 - 36 > 39, magnesium 2.1, phosphorus 4.0.  Influenza A, B, SARS coronavirus 2 was negative.cxr-previous CABG.  Cardiomegaly,IV Lasix 60 mgx1 and admitted for chf exacerbation. Patient had clinical improvement diuretics.  Renal function with CKD remained stable. Bnp >4500-- 3538 w/ diuresis.  Seen by cardiology in consultation and managed with diuresis.  He had issues w/ elevated blood pressure  and some chest pain during hospitalization likely noncardiac, serial troponins are negative      Subjective: 2/18 afebrile overnight A/O x4 (will answer questions appropriately but must give patient time).  States his right upper extremity retraction chronic, bilateral lower extremity weakness new onset    Assessment & Plan: Covid vaccination; vaccinated 2/3 believes it was [employer].  States would like Bivalent booster.  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 -Overall all improving but slowly,Net IO Since Admission: -1,920 mL [07/27/21 1045] appreciate cardiology input increasing Lasix to 60 twice daily as he has ongoing fluid overload and edema.   -Continue home Coreg Cardura, BiDil increased to 3 times daily for additional BP control and chf. -Monitor I/O, daily weight, keep on salt and fluid restriction.  Cardiology consulted. His previous weight on January 25 was 95.6 kg-wt changes see below -Strict in and out -5.7 L, 2/18 - Daily weight Filed Weights   07/29/21 0530 07/30/21 0500 07/31/21 0550  Weight: 91.6 kg 90.2 kg 90.7 kg  -Coreg 12.5 mg BID - Cardura 4 mg daily - 2/15 Lasix IV increased to 80 mg BID -BiDil 20-37.5 mg TID -2/17 Hydralazine PRN SBP> 160 or DBP> 100 -217 cardiology would like to continue to diurese. -Per cardiology No ACE-I/ARB/ARNI/Spiro/SGLT2 inhibitor given his CKD. -2/18 heart healthy diet/carb modified diet with fluid restriction -2/18 HTN currently controlled   Essential hypertension, benign- (present on admission) -Poorly controlled.  -Cardiology on board.  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   -Previous CABG 03/25/2019.   -Nuclear stress 9/22 mostly fixed basal to mild inferior defects, scar with minimal peri-infarct ischemia.  Continue diuresis hopefully with it symptoms will improve.   -Not on  statin due to intolerance. -2/18 negative CP, SOB   Elevated troponin- (present on admission) -No delta 40> 39, subtle elevation in the setting of CHF demand ischemia   Mixed hyperlipidemia- (present on admission) -Not on a statin due to intolerance.   Anemia  due to chronic kidney disease - Monitor closely - Transfuse for hemoglobin<7 Lab Results  Component Value Date   HGB 8.4 (L) 07/31/2021   HGB 8.6 (L) 07/30/2021   HGB 8.6 (L) 07/29/2021   HGB 8.4 (L) 07/26/2021   HGB 8.8 (L) 07/25/2021  -2/15 anemia panel more consistent with anemia due to chronic kidney disease -2/18 however patient is low on iron and his ferritin is at the low end of normal therefore we will start him on ferrous sulfate 325 mg BID + vitamin C 500 mg daily   Obstructive sleep apnea -Cont CPAP   DM type II with hyperglycemia  -2/12 hemoglobin A1c= 6.4 -Sensitive SSI  CKD (chronic kidney disease) stage 4, GFR 15-29 ml/min (POA)Baseline CR  3.5-4.   Lab Results  Component Value Date   CREATININE 3.67 (H) 07/31/2021   CREATININE 3.80 (H) 07/30/2021   CREATININE 3.89 (H) 07/29/2021   CREATININE 3.73 (H) 07/28/2021   CREATININE 3.78 (H) 07/27/2021  -Baseline -Followed by Nephrology Dr. Moshe Cipro as an outpatient.  Metabolic acidosis -Due to CKD.  Monitor bicarb.  Goals of care - 2/18 PT/OT consult; significant improvement in patient's CHF obtain evaluation for CIR  vs SNF  .    DVT prophylaxis: Lovenox Code Status: Full Family Communication: 2/18 daughter present at bedside for discussion of plan of care all questions answered Status is: Inpatient    Dispo: The patient is from: Home              Anticipated d/c is to: Home              Anticipated d/c date is: > 3 days              Patient currently is not medically stable to d/c.      Consultants:   Cardiology  Procedures/Significant Events:    I have personally reviewed and interpreted all radiology studies and my findings are as above.  VENTILATOR SETTINGS:    Cultures   Antimicrobials:    Devices    LINES / TUBES:      Continuous Infusions:   Objective: Vitals:   07/30/21 0540 07/30/21 1236 07/30/21 2138 07/31/21 0550  BP: (!) 189/114 (!) 153/90 138/83 (!)  155/93  Pulse: 81 74 75 82  Resp: 19 18 20 18   Temp: 97.7 F (36.5 C) 98.3 F (36.8 C) 97.6 F (36.4 C) 98.3 F (36.8 C)  TempSrc: Oral Oral Oral   SpO2: 98% 100% 97% 97%  Weight:    90.7 kg  Height:        Intake/Output Summary (Last 24 hours) at 07/31/2021 0830 Last data filed at 07/31/2021 0500 Gross per 24 hour  Intake 1200 ml  Output 1300 ml  Net -100 ml    Filed Weights   07/29/21 0530 07/30/21 0500 07/31/21 0550  Weight: 91.6 kg 90.2 kg 90.7 kg    Examination:  General: A/O x4 No acute respiratory distress Eyes: negative scleral hemorrhage, negative anisocoria, negative icterus ENT: Negative Runny nose, negative gingival bleeding, Neck:  Negative scars, masses, torticollis, lymphadenopathy, JVD Lungs: Clear to auscultation bilaterally without wheezes or crackles Cardiovascular: Regular rate and rhythm without murmur gallop or rub normal S1 and S2 Abdomen:  negative abdominal pain, nondistended, positive soft, bowel sounds, no rebound, no ascites, no appreciable mass Extremities: No significant cyanosis, clubbing, or edema bilateral lower extremities Skin: Negative rashes, lesions, ulcers Psychiatric:  Negative depression, negative anxiety, negative fatigue, negative mania  Central nervous system:  Cranial nerves II through XII intact, tongue/uvula midline, all extremities muscle strength 5/5, sensation intact throughout, negative dysarthria, negative expressive aphasia, negative receptive aphasia.  .     Data Reviewed: Care during the described time interval was provided by me .  I have reviewed this patient's available data, including medical history, events of note, physical examination, and all test results as part of my evaluation.  CBC: Recent Labs  Lab 07/24/21 2338 07/25/21 0231 07/26/21 0449 07/29/21 0510 07/30/21 0443 07/31/21 0708  WBC 6.3 6.8 5.2 5.1 4.9 6.1  NEUTROABS 4.5  --   --  3.1 2.9 4.0  HGB 9.6* 8.8* 8.4* 8.6* 8.6* 8.4*  HCT 31.6*  29.6* 26.9* 29.4* 28.8* 27.5*  MCV 90.8 90.0 90.0 89.9 89.4 89.3  PLT 238 234 211 221 217 308    Basic Metabolic Panel: Recent Labs  Lab 07/25/21 0231 07/26/21 0449 07/27/21 0526 07/28/21 0555 07/29/21 0510 07/30/21 0443  NA 139 138 139 139 139 140  K 3.4* 3.7 3.6 4.0 3.6 3.8  CL 110 109 112* 108 107 110  CO2 20* 18* 18* 18* 18* 18*  GLUCOSE 173* 134* 154* 138* 141* 124*  BUN 45* 44* 44* 44* 44* 43*  CREATININE 3.71* 3.73* 3.78* 3.73* 3.89* 3.80*  CALCIUM 8.3* 8.3* 8.3* 8.8* 8.5* 8.5*  MG 2.1  --   --   --  2.0 2.1  PHOS 4.0  --   --   --  4.3 4.1    GFR: Estimated Creatinine Clearance: 22.3 mL/min (A) (by C-G formula based on SCr of 3.8 mg/dL (H)). Liver Function Tests: Recent Labs  Lab 07/24/21 2338 07/25/21 0231 07/29/21 0510 07/30/21 0443  AST 13* 11* 9* 8*  ALT 14 15 8 8   ALKPHOS 85 80 58 57  BILITOT 0.6 0.4 0.4 0.5  PROT 6.6 6.0* 5.9* 5.8*  ALBUMIN 3.5 3.3* 3.3* 3.3*    No results for input(s): LIPASE, AMYLASE in the last 168 hours. No results for input(s): AMMONIA in the last 168 hours. Coagulation Profile: No results for input(s): INR, PROTIME in the last 168 hours. Cardiac Enzymes: No results for input(s): CKTOTAL, CKMB, CKMBINDEX, TROPONINI in the last 168 hours. BNP (last 3 results) No results for input(s): PROBNP in the last 8760 hours. HbA1C: No results for input(s): HGBA1C in the last 72 hours. CBG: Recent Labs  Lab 07/30/21 0741 07/30/21 1139 07/30/21 1600 07/30/21 2136 07/31/21 0724  GLUCAP 119* 189* 219* 243* 130*    Lipid Profile: No results for input(s): CHOL, HDL, LDLCALC, TRIG, CHOLHDL, LDLDIRECT in the last 72 hours. Thyroid Function Tests: No results for input(s): TSH, T4TOTAL, FREET4, T3FREE, THYROIDAB in the last 72 hours. Anemia Panel: Recent Labs    07/29/21 0510  VITAMINB12 432  FOLATE 6.6  FERRITIN 148  TIBC 264  IRON 32*  RETICCTPCT 2.1    Sepsis Labs: No results for input(s): PROCALCITON, LATICACIDVEN in  the last 168 hours.  Recent Results (from the past 240 hour(s))  Resp Panel by RT-PCR (Flu A&B, Covid) Nasopharyngeal Swab     Status: None   Collection Time: 07/24/21 11:43 PM   Specimen: Nasopharyngeal Swab; Nasopharyngeal(NP) swabs in vial transport medium  Result Value Ref Range Status   SARS  Coronavirus 2 by RT PCR NEGATIVE NEGATIVE Final    Comment: (NOTE) SARS-CoV-2 target nucleic acids are NOT DETECTED.  The SARS-CoV-2 RNA is generally detectable in upper respiratory specimens during the acute phase of infection. The lowest concentration of SARS-CoV-2 viral copies this assay can detect is 138 copies/mL. A negative result does not preclude SARS-Cov-2 infection and should not be used as the sole basis for treatment or other patient management decisions. A negative result may occur with  improper specimen collection/handling, submission of specimen other than nasopharyngeal swab, presence of viral mutation(s) within the areas targeted by this assay, and inadequate number of viral copies(<138 copies/mL). A negative result must be combined with clinical observations, patient history, and epidemiological information. The expected result is Negative.  Fact Sheet for Patients:  EntrepreneurPulse.com.au  Fact Sheet for Healthcare Providers:  IncredibleEmployment.be  This test is no t yet approved or cleared by the [place] FDA and  has been authorized for detection and/or diagnosis of SARS-CoV-2 by FDA under an Emergency Use Authorization (EUA). This EUA will remain  in effect (meaning this test can be used) for the duration of the COVID-19 declaration under Section 564(b)(1) of the Act, 21 U.S.C.section 360bbb-3(b)(1), unless the authorization is terminated  or revoked sooner.       Influenza A by PCR NEGATIVE NEGATIVE Final   Influenza B by PCR NEGATIVE NEGATIVE Final    Comment: (NOTE) The Xpert Xpress SARS-CoV-2/FLU/RSV plus assay  is intended as an aid in the diagnosis of influenza from Nasopharyngeal swab specimens and should not be used as a sole basis for treatment. Nasal washings and aspirates are unacceptable for Xpert Xpress SARS-CoV-2/FLU/RSV testing.  Fact Sheet for Patients: EntrepreneurPulse.com.au  Fact Sheet for Healthcare Providers: IncredibleEmployment.be  This test is not yet approved or cleared by the [place] FDA and has been authorized for detection and/or diagnosis of SARS-CoV-2 by FDA under an Emergency Use Authorization (EUA). This EUA will remain in effect (meaning this test can be used) for the duration of the COVID-19 declaration under Section 564(b)(1) of the Act, 21 U.S.C. section 360bbb-3(b)(1), unless the authorization is terminated or revoked.  Performed at [facility], [address] [place], [place] [address]           Radiology Studies: No results found.      Scheduled Meds:  aspirin EC  324 mg Oral Daily   bacitracin   Topical BID   carvedilol  12.5 mg Oral BID   COVID-19 mRNA bivalent vaccine (Pfizer)  0.3 mL Intramuscular Once   doxazosin  4 mg Oral Daily   enoxaparin (LOVENOX) injection  30 mg Subcutaneous Q24H   furosemide  80 mg Intravenous Q12H   insulin aspart  0-5 Units Subcutaneous QHS   insulin aspart  0-9 Units Subcutaneous TID WC   isosorbide-hydrALAZINE  1 tablet Oral TID   metoCLOPramide  10 mg Oral TID AC   multivitamin with minerals  1 tablet Oral Daily   pantoprazole  40 mg Oral Daily   potassium chloride  10 mEq Oral Daily   sertraline  100 mg Oral Daily   Continuous Infusions:   LOS: 6 days    Time spent:40 min    Carter Kassel, Geraldo Docker, MD [facility]   If 7PM-7AM, please contact night-coverage 07/31/2021, 8:30 AM

## 2021-08-01 DIAGNOSIS — I5043 Acute on chronic combined systolic (congestive) and diastolic (congestive) heart failure: Secondary | ICD-10-CM | POA: Diagnosis not present

## 2021-08-01 DIAGNOSIS — N184 Chronic kidney disease, stage 4 (severe): Secondary | ICD-10-CM | POA: Diagnosis not present

## 2021-08-01 DIAGNOSIS — N1832 Chronic kidney disease, stage 3b: Secondary | ICD-10-CM | POA: Diagnosis not present

## 2021-08-01 DIAGNOSIS — I25708 Atherosclerosis of coronary artery bypass graft(s), unspecified, with other forms of angina pectoris: Secondary | ICD-10-CM | POA: Diagnosis not present

## 2021-08-01 LAB — COMPREHENSIVE METABOLIC PANEL
ALT: 9 U/L (ref 0–44)
AST: 9 U/L — ABNORMAL LOW (ref 15–41)
Albumin: 3.4 g/dL — ABNORMAL LOW (ref 3.5–5.0)
Alkaline Phosphatase: 59 U/L (ref 38–126)
Anion gap: 10 (ref 5–15)
BUN: 42 mg/dL — ABNORMAL HIGH (ref 8–23)
CO2: 22 mmol/L (ref 22–32)
Calcium: 8.5 mg/dL — ABNORMAL LOW (ref 8.9–10.3)
Chloride: 107 mmol/L (ref 98–111)
Creatinine, Ser: 3.62 mg/dL — ABNORMAL HIGH (ref 0.61–1.24)
GFR, Estimated: 18 mL/min — ABNORMAL LOW (ref >60)
Glucose, Bld: 136 mg/dL — ABNORMAL HIGH (ref 70–99)
Potassium: 3.7 mmol/L (ref 3.5–5.1)
Sodium: 139 mmol/L (ref 135–145)
Total Bilirubin: 0.6 mg/dL (ref 0.3–1.2)
Total Protein: 6.3 g/dL — ABNORMAL LOW (ref 6.5–8.1)

## 2021-08-01 LAB — CBC WITH DIFFERENTIAL/PLATELET
Abs Immature Granulocytes: 0.02 10*3/uL (ref 0.00–0.07)
Basophils Absolute: 0.1 10*3/uL (ref 0.0–0.1)
Basophils Relative: 2 %
Eosinophils Absolute: 0.2 10*3/uL (ref 0.0–0.5)
Eosinophils Relative: 3 %
HCT: 29.1 % — ABNORMAL LOW (ref 39.0–52.0)
Hemoglobin: 8.5 g/dL — ABNORMAL LOW (ref 13.0–17.0)
Immature Granulocytes: 0 %
Lymphocytes Relative: 20 %
Lymphs Abs: 1.2 10*3/uL (ref 0.7–4.0)
MCH: 25.8 pg — ABNORMAL LOW (ref 26.0–34.0)
MCHC: 29.2 g/dL — ABNORMAL LOW (ref 30.0–36.0)
MCV: 88.4 fL (ref 80.0–100.0)
Monocytes Absolute: 0.6 10*3/uL (ref 0.1–1.0)
Monocytes Relative: 10 %
Neutro Abs: 3.9 10*3/uL (ref 1.7–7.7)
Neutrophils Relative %: 65 %
Platelets: 224 10*3/uL (ref 150–400)
RBC: 3.29 MIL/uL — ABNORMAL LOW (ref 4.22–5.81)
RDW: 16.7 % — ABNORMAL HIGH (ref 11.5–15.5)
WBC: 6 10*3/uL (ref 4.0–10.5)
nRBC: 0 % (ref 0.0–0.2)

## 2021-08-01 LAB — GLUCOSE, CAPILLARY
Glucose-Capillary: 126 mg/dL — ABNORMAL HIGH (ref 70–99)
Glucose-Capillary: 218 mg/dL — ABNORMAL HIGH (ref 70–99)
Glucose-Capillary: 161 mg/dL — ABNORMAL HIGH (ref 70–99)
Glucose-Capillary: 146 mg/dL — ABNORMAL HIGH (ref 70–99)

## 2021-08-01 LAB — MAGNESIUM: Magnesium: 2.1 mg/dL (ref 1.7–2.4)

## 2021-08-01 LAB — PHOSPHORUS: Phosphorus: 4 mg/dL (ref 2.5–4.6)

## 2021-08-01 MED ORDER — ISOSORB DINITRATE-HYDRALAZINE 20-37.5 MG PO TABS
1.0000 | ORAL_TABLET | Freq: Four times a day (QID) | ORAL | Status: DC
  Administered 2021-08-01 (×4): 1 via ORAL
  Filled 2021-08-01 (×4): qty 1

## 2021-08-01 NOTE — Progress Notes (Signed)
PROGRESS NOTE    [name]  MHD:[id number] DOB: [birth year] DOA: 07/24/2021 PCP: [name], MD     Brief Narrative:   66 y.o. WM PMHx malignant neoplasm of esophagus, essential hypertension, CAD, Q1JH, combine systolic and diastolic heart failure, CKD stage IV, sleep apnea on BiPAP her last ED visit at [facility] 2/7 due to chest pain seen in the ED and discharged, presents W/ several weeks of worsening shortness of breath on lying flat.   In ED- tachypneic, BP was 174/103  Labw w/ anemia, BUN/creatinine 46/3.80 (creatinine within baseline range), CBG 229, BNP > 4500, troponin x2 - 36 > 39, magnesium 2.1, phosphorus 4.0.  Influenza A, B, SARS coronavirus 2 was negative.cxr-previous CABG.  Cardiomegaly,IV Lasix 60 mgx1 and admitted for chf exacerbation. Patient had clinical improvement diuretics.  Renal function with CKD remained stable. Bnp >4500-- 3538 w/ diuresis.  Seen by cardiology in consultation and managed with diuresis.  He had issues w/ elevated blood pressure  and some chest pain during hospitalization likely noncardiac, serial troponins are negative      Subjective: 2/19 afebrile overnight A/O x4.  Sitting in bed comfortably, negative SOB negative CP    Assessment & Plan: Covid vaccination; vaccinated 2/3 believes it was [employer].  States would like Bivalent booster.  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 -Overall all improving but slowly,Net IO Since Admission: -1,920 mL [07/27/21 1045] appreciate cardiology input increasing Lasix to 60 twice daily as he has ongoing fluid overload and edema.   -Cadiology consulted. His previous weight on January 25 was 95.6 kg-wt changes see below -Strict in and out -8.2 L, 2/19 - Daily weight Filed Weights   07/30/21 0500 07/31/21 0550 08/01/21 0452  Weight: 90.2 kg 90.7 kg 90.1 kg  -Coreg 12.5 mg BID - Cardura 4 mg daily - 2/15 Lasix IV increased to 80 mg BID -2/17 Hydralazine PRN SBP> 160 or DBP> 100 -217 cardiology would like to continue to diurese. -Per cardiology No ACE-I/ARB/ARNI/Spiro/SGLT2 inhibitor given his CKD. -2/18 heart healthy diet/carb modified diet with fluid restriction -2/18 HTN currently controlled - 2/19 increase BiDil 20-37.5 mg QID -2/19 patient's creatinine continues to slowly improve therefore will continue to diurese unless cardiology otherwise recommends against it.   Essential hypertension, benign- (present on admission) -Poorly controlled.  -Cardiology on board.  We will help with adjustments of medication - See CHF     CAD involving coronary bypass graft of native heart with other forms of angina pectoris ([place])- (present on admission) -Chest pain overnight but reproducible.   -Previous CABG 03/25/2019.   -Nuclear stress 9/22 mostly fixed basal to mild inferior defects, scar with minimal peri-infarct ischemia.  Continue diuresis hopefully with it symptoms will improve.   -Not on statin due to intolerance. -2/18 negative CP, SOB   Elevated troponin- (present on admission) -No delta 40> 39, subtle  elevation in the setting of CHF demand ischemia   Mixed hyperlipidemia- (present on admission) -Not on a statin due to intolerance.   Anemia due to chronic kidney disease - Monitor closely - Transfuse for hemoglobin<7 Lab Results  Component Value Date   HGB  8.5 (L) 08/01/2021   HGB 8.4 (L) 07/31/2021   HGB 8.6 (L) 07/30/2021   HGB 8.6 (L) 07/29/2021   HGB 8.4 (L) 07/26/2021  -2/15 anemia panel more consistent with anemia due to chronic kidney disease -2/18 however patient is low on iron and his ferritin is at the low end of normal therefore we will start him on ferrous sulfate 325 mg BID + vitamin C 500 mg daily   Obstructive sleep apnea -Cont CPAP   DM type II with hyperglycemia  -2/12 hemoglobin A1c= 6.4 Lab Results  Component Value Date   GLUCAP 218 (H) 08/01/2021   GLUCAP 126 (H) 08/01/2021   GLUCAP 136 (H) 07/31/2021   GLUCAP 201 (H) 07/31/2021   GLUCAP 162 (H) 07/31/2021  -Sensitive SSI  CKD (chronic kidney disease) stage 4, GFR 15-29 ml/min (POA)Baseline CR  3.5-4.   Lab Results  Component Value Date   CREATININE 3.62 (H) 08/01/2021   CREATININE 3.67 (H) 07/31/2021   CREATININE 3.80 (H) 07/30/2021   CREATININE 3.89 (H) 07/29/2021   CREATININE 3.73 (H) 07/28/2021  -Baseline -Followed by Nephrology Dr. Moshe Cipro as an outpatient.  Metabolic acidosis -Due to CKD.  Monitor bicarb.  Goals of care - 2/18 PT/OT consult; significant improvement in patient's CHF obtain evaluation for CIR  vs SNF  .    DVT prophylaxis: Lovenox Code Status: Full Family Communication: 2/18 daughter present at bedside for discussion of plan of care all questions answered Status is: Inpatient    Dispo: The patient is from: Home              Anticipated d/c is to: Home              Anticipated d/c date is: > 3 days              Patient currently is not medically stable to d/c.      Consultants:   Cardiology  Procedures/Significant Events:    I have personally reviewed and interpreted all radiology studies and my findings are as above.  VENTILATOR SETTINGS: Room air 2/19 SPO2 99%   Cultures   Antimicrobials:    Devices    LINES / TUBES:      Continuous Infusions:   Objective: Vitals:   07/31/21 0550  07/31/21 1358 07/31/21 2104 08/01/21 0452  BP: (!) 155/93 129/73 (!) 164/98 (!) 169/94  Pulse: 82 74 80 81  Resp: 18  18 18   Temp: 98.3 F (36.8 C) 98 F (36.7 C) 98.1 F (36.7 C) (!) 97.5 F (36.4 C)  TempSrc:   Oral Oral  SpO2: 97% 97% 96% 99%  Weight: 90.7 kg   90.1 kg  Height:        Intake/Output Summary (Last 24 hours) at 08/01/2021 0743 Last data filed at 08/01/2021 0400 Gross per 24 hour  Intake 480 ml  Output 3050 ml  Net -2570 ml    Filed Weights   07/30/21 0500 07/31/21 0550 08/01/21 0452  Weight: 90.2 kg 90.7 kg 90.1 kg    Examination:  General: A/O x4 No acute respiratory distress Eyes: negative scleral hemorrhage, negative anisocoria, negative icterus ENT: Negative Runny nose, negative gingival bleeding, Neck:  Negative scars, masses, torticollis, lymphadenopathy, JVD Lungs:  Clear to auscultation bilaterally without wheezes or crackles Cardiovascular: Regular rate and rhythm without murmur gallop or rub normal S1 and S2 Abdomen: negative abdominal pain, nondistended, positive soft, bowel sounds, no rebound, no ascites, no appreciable mass Extremities: No significant cyanosis, clubbing, or edema bilateral lower extremities Skin: Negative rashes, lesions, ulcers Psychiatric:  Negative depression, negative anxiety, negative fatigue, negative mania  Central nervous system:  Cranial nerves II through XII intact, tongue/uvula midline, all extremities muscle strength 5/5, sensation intact throughout, negative dysarthria, negative expressive aphasia, negative receptive aphasia.  .     Data Reviewed: Care during the described time interval was provided by me .  I have reviewed this patient's available data, including medical history, events of note, physical examination, and all test results as part of my evaluation.  CBC: Recent Labs  Lab 07/26/21 0449 07/29/21 0510 07/30/21 0443 07/31/21 0708 08/01/21 0441  WBC 5.2 5.1 4.9 6.1 6.0  NEUTROABS  --  3.1 2.9  4.0 3.9  HGB 8.4* 8.6* 8.6* 8.4* 8.5*  HCT 26.9* 29.4* 28.8* 27.5* 29.1*  MCV 90.0 89.9 89.4 89.3 88.4  PLT 211 221 217 217 433    Basic Metabolic Panel: Recent Labs  Lab 07/28/21 0555 07/29/21 0510 07/30/21 0443 07/31/21 0708 08/01/21 0441  NA 139 139 140 138 139  K 4.0 3.6 3.8 3.7 3.7  CL 108 107 110 108 107  CO2 18* 18* 18* 19* 22  GLUCOSE 138* 141* 124* 140* 136*  BUN 44* 44* 43* 43* 42*  CREATININE 3.73* 3.89* 3.80* 3.67* 3.62*  CALCIUM 8.8* 8.5* 8.5* 8.5* 8.5*  MG  --  2.0 2.1 2.1 2.1  PHOS  --  4.3 4.1 4.1 4.0    GFR: Estimated Creatinine Clearance: 23.3 mL/min (A) (by C-G formula based on SCr of 3.62 mg/dL (H)). Liver Function Tests: Recent Labs  Lab 07/29/21 0510 07/30/21 0443 07/31/21 0708 08/01/21 0441  AST 9* 8* 9* 9*  ALT 8 8 9 9   ALKPHOS 58 57 59 59  BILITOT 0.4 0.5 0.5 0.6  PROT 5.9* 5.8* 5.9* 6.3*  ALBUMIN 3.3* 3.3* 3.3* 3.4*    No results for input(s): LIPASE, AMYLASE in the last 168 hours. No results for input(s): AMMONIA in the last 168 hours. Coagulation Profile: No results for input(s): INR, PROTIME in the last 168 hours. Cardiac Enzymes: No results for input(s): CKTOTAL, CKMB, CKMBINDEX, TROPONINI in the last 168 hours. BNP (last 3 results) No results for input(s): PROBNP in the last 8760 hours. HbA1C: No results for input(s): HGBA1C in the last 72 hours. CBG: Recent Labs  Lab 07/30/21 2136 07/31/21 0724 07/31/21 1107 07/31/21 1605 07/31/21 2137  GLUCAP 243* 130* 162* 201* 136*    Lipid Profile: No results for input(s): CHOL, HDL, LDLCALC, TRIG, CHOLHDL, LDLDIRECT in the last 72 hours. Thyroid Function Tests: No results for input(s): TSH, T4TOTAL, FREET4, T3FREE, THYROIDAB in the last 72 hours. Anemia Panel: No results for input(s): VITAMINB12, FOLATE, FERRITIN, TIBC, IRON, RETICCTPCT in the last 72 hours.  Sepsis Labs: No results for input(s): PROCALCITON, LATICACIDVEN in the last 168 hours.  Recent Results (from the  past 240 hour(s))  Resp Panel by RT-PCR (Flu A&B, Covid) Nasopharyngeal Swab     Status: None   Collection Time: 07/24/21 11:43 PM   Specimen: Nasopharyngeal Swab; Nasopharyngeal(NP) swabs in vial transport medium  Result Value Ref Range Status   SARS Coronavirus 2 by RT PCR NEGATIVE NEGATIVE Final    Comment: (NOTE) SARS-CoV-2 target nucleic acids are NOT  DETECTED.  The SARS-CoV-2 RNA is generally detectable in upper respiratory specimens during the acute phase of infection. The lowest concentration of SARS-CoV-2 viral copies this assay can detect is 138 copies/mL. A negative result does not preclude SARS-Cov-2 infection and should not be used as the sole basis for treatment or other patient management decisions. A negative result may occur with  improper specimen collection/handling, submission of specimen other than nasopharyngeal swab, presence of viral mutation(s) within the areas targeted by this assay, and inadequate number of viral copies(<138 copies/mL). A negative result must be combined with clinical observations, patient history, and epidemiological information. The expected result is Negative.  Fact Sheet for Patients:  EntrepreneurPulse.com.au  Fact Sheet for Healthcare Providers:  IncredibleEmployment.be  This test is no t yet approved or cleared by the [place] FDA and  has been authorized for detection and/or diagnosis of SARS-CoV-2 by FDA under an Emergency Use Authorization (EUA). This EUA will remain  in effect (meaning this test can be used) for the duration of the COVID-19 declaration under Section 564(b)(1) of the Act, 21 U.S.C.section 360bbb-3(b)(1), unless the authorization is terminated  or revoked sooner.       Influenza A by PCR NEGATIVE NEGATIVE Final   Influenza B by PCR NEGATIVE NEGATIVE Final    Comment: (NOTE) The Xpert Xpress SARS-CoV-2/FLU/RSV plus assay is intended as an aid in the diagnosis of  influenza from Nasopharyngeal swab specimens and should not be used as a sole basis for treatment. Nasal washings and aspirates are unacceptable for Xpert Xpress SARS-CoV-2/FLU/RSV testing.  Fact Sheet for Patients: EntrepreneurPulse.com.au  Fact Sheet for Healthcare Providers: IncredibleEmployment.be  This test is not yet approved or cleared by the [place] FDA and has been authorized for detection and/or diagnosis of SARS-CoV-2 by FDA under an Emergency Use Authorization (EUA). This EUA will remain in effect (meaning this test can be used) for the duration of the COVID-19 declaration under Section 564(b)(1) of the Act, 21 U.S.C. section 360bbb-3(b)(1), unless the authorization is terminated or revoked.  Performed at [facility], [address], [place], [place] [address]           Radiology Studies: No results found.      Scheduled Meds:  vitamin C  500 mg Oral Daily   aspirin EC  324 mg Oral Daily   bacitracin   Topical BID   carvedilol  12.5 mg Oral BID   COVID-19 mRNA bivalent vaccine (Pfizer)  0.3 mL Intramuscular Once   doxazosin  4 mg Oral Daily   enoxaparin (LOVENOX) injection  30 mg Subcutaneous Q24H   ferrous sulfate  325 mg Oral BID WC   furosemide  80 mg Intravenous Q12H   insulin aspart  0-5 Units Subcutaneous QHS   insulin aspart  0-9 Units Subcutaneous TID WC   isosorbide-hydrALAZINE  1 tablet Oral TID   metoCLOPramide  10 mg Oral TID AC   multivitamin with minerals  1 tablet Oral Daily   pantoprazole  40 mg Oral BID   potassium chloride  10 mEq Oral Daily   sertraline  100 mg Oral Daily   Continuous Infusions:   LOS: 7 days    Time spent:40 min    Jakhia Buxton, Geraldo Docker, MD [facility]   If 7PM-7AM, please contact night-coverage 08/01/2021, 7:43 AM

## 2021-08-02 DIAGNOSIS — R778 Other specified abnormalities of plasma proteins: Secondary | ICD-10-CM | POA: Diagnosis not present

## 2021-08-02 DIAGNOSIS — I25708 Atherosclerosis of coronary artery bypass graft(s), unspecified, with other forms of angina pectoris: Secondary | ICD-10-CM | POA: Diagnosis not present

## 2021-08-02 DIAGNOSIS — I5043 Acute on chronic combined systolic (congestive) and diastolic (congestive) heart failure: Secondary | ICD-10-CM | POA: Diagnosis not present

## 2021-08-02 DIAGNOSIS — N184 Chronic kidney disease, stage 4 (severe): Secondary | ICD-10-CM | POA: Diagnosis not present

## 2021-08-02 LAB — COMPREHENSIVE METABOLIC PANEL
ALT: 8 U/L (ref 0–44)
AST: 9 U/L — ABNORMAL LOW (ref 15–41)
Albumin: 3.1 g/dL — ABNORMAL LOW (ref 3.5–5.0)
Alkaline Phosphatase: 50 U/L (ref 38–126)
Anion gap: 9 (ref 5–15)
BUN: 43 mg/dL — ABNORMAL HIGH (ref 8–23)
CO2: 20 mmol/L — ABNORMAL LOW (ref 22–32)
Calcium: 8.4 mg/dL — ABNORMAL LOW (ref 8.9–10.3)
Chloride: 106 mmol/L (ref 98–111)
Creatinine, Ser: 3.58 mg/dL — ABNORMAL HIGH (ref 0.61–1.24)
GFR, Estimated: 18 mL/min — ABNORMAL LOW (ref >60)
Glucose, Bld: 135 mg/dL — ABNORMAL HIGH (ref 70–99)
Potassium: 3.3 mmol/L — ABNORMAL LOW (ref 3.5–5.1)
Sodium: 135 mmol/L (ref 135–145)
Total Bilirubin: 0.6 mg/dL (ref 0.3–1.2)
Total Protein: 5.5 g/dL — ABNORMAL LOW (ref 6.5–8.1)

## 2021-08-02 LAB — MAGNESIUM: Magnesium: 2.1 mg/dL (ref 1.7–2.4)

## 2021-08-02 LAB — CBC WITH DIFFERENTIAL/PLATELET
Abs Immature Granulocytes: 0.02 10*3/uL (ref 0.00–0.07)
Basophils Absolute: 0.1 10*3/uL (ref 0.0–0.1)
Basophils Relative: 2 %
Eosinophils Absolute: 0.3 10*3/uL (ref 0.0–0.5)
Eosinophils Relative: 5 %
HCT: 26.5 % — ABNORMAL LOW (ref 39.0–52.0)
Hemoglobin: 8.1 g/dL — ABNORMAL LOW (ref 13.0–17.0)
Immature Granulocytes: 0 %
Lymphocytes Relative: 21 %
Lymphs Abs: 1.2 10*3/uL (ref 0.7–4.0)
MCH: 26.6 pg (ref 26.0–34.0)
MCHC: 30.6 g/dL (ref 30.0–36.0)
MCV: 87.2 fL (ref 80.0–100.0)
Monocytes Absolute: 0.7 10*3/uL (ref 0.1–1.0)
Monocytes Relative: 12 %
Neutro Abs: 3.5 10*3/uL (ref 1.7–7.7)
Neutrophils Relative %: 60 %
Platelets: 219 10*3/uL (ref 150–400)
RBC: 3.04 MIL/uL — ABNORMAL LOW (ref 4.22–5.81)
RDW: 16.4 % — ABNORMAL HIGH (ref 11.5–15.5)
WBC: 5.8 10*3/uL (ref 4.0–10.5)
nRBC: 0 % (ref 0.0–0.2)

## 2021-08-02 LAB — GLUCOSE, CAPILLARY
Glucose-Capillary: 118 mg/dL — ABNORMAL HIGH (ref 70–99)
Glucose-Capillary: 183 mg/dL — ABNORMAL HIGH (ref 70–99)

## 2021-08-02 LAB — PHOSPHORUS: Phosphorus: 3.8 mg/dL (ref 2.5–4.6)

## 2021-08-02 MED ORDER — NITROGLYCERIN 0.4 MG SL SUBL: 0.4000 mg | SUBLINGUAL_TABLET | SUBLINGUAL | 0 refills | Status: DC | PRN

## 2021-08-02 MED ORDER — ACETAMINOPHEN 325 MG PO TABS: 650.0000 mg | ORAL_TABLET | Freq: Four times a day (QID) | ORAL | 0 refills | Status: DC | PRN

## 2021-08-02 MED ORDER — TORSEMIDE 20 MG PO TABS: 20.0000 mg | ORAL_TABLET | Freq: Every evening | ORAL | 0 refills | Status: DC

## 2021-08-02 MED ORDER — ASPIRIN 81 MG PO TBEC: 324.0000 mg | DELAYED_RELEASE_TABLET | Freq: Every day | ORAL | 0 refills | Status: DC

## 2021-08-02 MED ORDER — TORSEMIDE 40 MG PO TABS: 40.0000 mg | ORAL_TABLET | Freq: Every day | ORAL | 0 refills | Status: DC

## 2021-08-02 MED ORDER — HYDRALAZINE HCL 25 MG PO TABS
50.0000 mg | ORAL_TABLET | Freq: Three times a day (TID) | ORAL | Status: DC
  Administered 2021-08-02: 50 mg via ORAL
  Filled 2021-08-02: qty 2

## 2021-08-02 MED ORDER — ISOSORBIDE DINITRATE 20 MG PO TABS
30.0000 mg | ORAL_TABLET | Freq: Three times a day (TID) | ORAL | Status: DC
  Administered 2021-08-02: 30 mg via ORAL
  Filled 2021-08-02: qty 2

## 2021-08-02 MED ORDER — TORSEMIDE 20 MG PO TABS: 20.0000 mg | ORAL_TABLET | Freq: Every evening | ORAL | Status: DC

## 2021-08-02 MED ORDER — ISOSORBIDE DINITRATE 30 MG PO TABS: 30.0000 mg | ORAL_TABLET | Freq: Three times a day (TID) | ORAL | 0 refills | Status: DC

## 2021-08-02 MED ORDER — ASCORBIC ACID 500 MG PO TABS: 500.0000 mg | ORAL_TABLET | Freq: Every day | ORAL | 0 refills | Status: DC

## 2021-08-02 MED ORDER — TORSEMIDE 20 MG PO TABS: 40.0000 mg | ORAL_TABLET | Freq: Every day | ORAL | Status: DC

## 2021-08-02 MED ORDER — COVID-19MRNA BIVAL VACC PFIZER 30 MCG/0.3ML IM SUSP: 0.3000 mL | Freq: Once | INTRAMUSCULAR | 0 refills | Status: AC

## 2021-08-02 MED ORDER — HYDRALAZINE HCL 50 MG PO TABS: 50.0000 mg | ORAL_TABLET | Freq: Three times a day (TID) | ORAL | 0 refills | Status: DC

## 2021-08-02 MED ORDER — FERROUS SULFATE 325 (65 FE) MG PO TABS: 325.0000 mg | ORAL_TABLET | Freq: Two times a day (BID) | ORAL | 0 refills | Status: DC

## 2021-08-02 MED ORDER — BACITRACIN ZINC 500 UNIT/GM EX OINT: TOPICAL_OINTMENT | Freq: Two times a day (BID) | CUTANEOUS | 0 refills | Status: DC

## 2021-08-02 MED ORDER — POTASSIUM CHLORIDE CRYS ER 20 MEQ PO TBCR: 40.0000 meq | EXTENDED_RELEASE_TABLET | Freq: Once | ORAL | Status: DC

## 2021-08-02 NOTE — Progress Notes (Signed)
Patient does not want covid vaccine at this time.

## 2021-08-02 NOTE — Progress Notes (Signed)
Discharged to home with care giver  Patient and care giver verbalized understanding of all instructions

## 2021-08-02 NOTE — Care Management Important Message (Signed)
Important Message  Patient Details  Name: [name] MRN: [id number] Date of Birth: 12-Apr-1956   Medicare Important Message Given:  Yes     Tommy Medal 08/02/2021, 11:56 AM

## 2021-08-02 NOTE — Progress Notes (Addendum)
Changed patient's bandage to blister on skin graft. Small amount of serous drainage noted on foam, no odor. Bacitracin and new foam applied.

## 2021-08-02 NOTE — Progress Notes (Signed)
Progress Note  Patient Name: [name] Date of Encounter: 08/02/2021  [facility] Cardiologist: Skeet Latch, MD   Subjective   No complaints  Inpatient Medications    Scheduled Meds:  vitamin C  500 mg Oral Daily   aspirin EC  324 mg Oral Daily   bacitracin   Topical BID   carvedilol  12.5 mg Oral BID   COVID-19 mRNA bivalent vaccine (Pfizer)  0.3 mL Intramuscular Once   doxazosin  4 mg Oral Daily   enoxaparin (LOVENOX) injection  30 mg Subcutaneous Q24H   ferrous sulfate  325 mg Oral BID WC   furosemide  80 mg Intravenous Q12H   insulin aspart  0-5 Units Subcutaneous QHS   insulin aspart  0-9 Units Subcutaneous TID WC   isosorbide-hydrALAZINE  1 tablet Oral QID   metoCLOPramide  10 mg Oral TID AC   multivitamin with minerals  1 tablet Oral Daily   pantoprazole  40 mg Oral BID   potassium chloride  10 mEq Oral Daily   sertraline  100 mg Oral Daily   Continuous Infusions:  PRN Meds: acetaminophen, albuterol, guaiFENesin, hydrALAZINE, nitroGLYCERIN, prochlorperazine, traMADol   Vital Signs    Vitals:   08/01/21 1346 08/01/21 2129 08/02/21 0500 08/02/21 0619  BP: 130/79 (!) 155/83 (!) 152/94   Pulse: 78 76 86   Resp: 17 16 17    Temp: 98.3 F (36.8 C) (!) 97.2 F (36.2 C) (!) 97.5 F (36.4 C)   TempSrc:   Oral   SpO2: 94% 99% 98%   Weight:    89.7 kg  Height:        Intake/Output Summary (Last 24 hours) at 08/02/2021 0916 Last data filed at 08/02/2021 6073 Gross per 24 hour  Intake 720 ml  Output 2400 ml  Net -1680 ml   Last 3 Weights 08/02/2021 08/01/2021 07/31/2021  Weight (lbs) 197 lb 12.8 oz 198 lb 10.2 oz 199 lb 15.3 oz  Weight (kg) 89.721 kg 90.1 kg 90.7 kg      Telemetry    NSR - Personally Reviewed  ECG    N/a - Personally Reviewed  Physical Exam   GEN: No acute distress.   Neck: No JVD Cardiac: RRR, no murmurs, rubs, or gallops.  Respiratory: Clear to auscultation bilaterally. GI: Soft, nontender, non-distended  MS: No  edema; No deformity. Neuro:  Nonfocal  Psych: Normal affect   Labs    High Sensitivity Troponin:   Recent Labs  Lab 07/25/21 0231 07/25/21 0538 07/25/21 0714 07/27/21 0526 07/27/21 0711  TROPONINIHS 39* 40* 39* 28* 27*     Chemistry Recent Labs  Lab 07/31/21 0708 08/01/21 0441 08/02/21 0507  NA 138 139 135  K 3.7 3.7 3.3*  CL 108 107 106  CO2 19* 22 20*  GLUCOSE 140* 136* 135*  BUN 43* 42* 43*  CREATININE 3.67* 3.62* 3.58*  CALCIUM 8.5* 8.5* 8.4*  MG 2.1 2.1 2.1  PROT 5.9* 6.3* 5.5*  ALBUMIN 3.3* 3.4* 3.1*  AST 9* 9* 9*  ALT 9 9 8   ALKPHOS 59 59 50  BILITOT 0.5 0.6 0.6  GFRNONAA 18* 18* 18*  ANIONGAP 11 10 9     Lipids No results for input(s): CHOL, TRIG, HDL, LABVLDL, LDLCALC, CHOLHDL in the last 168 hours.  Hematology Recent Labs  Lab 07/31/21 0708 08/01/21 0441 08/02/21 0507  WBC 6.1 6.0 5.8  RBC 3.08* 3.29* 3.04*  HGB 8.4* 8.5* 8.1*  HCT 27.5* 29.1* 26.5*  MCV 89.3 88.4 87.2  MCH 27.3 25.8* 26.6  MCHC 30.5 29.2* 30.6  RDW 16.9* 16.7* 16.4*  PLT 217 224 219   Thyroid No results for input(s): TSH, FREET4 in the last 168 hours.  BNPNo results for input(s): BNP, PROBNP in the last 168 hours.  DDimer No results for input(s): DDIMER in the last 168 hours.   Radiology    No results found.  Cardiac Studies     Patient Profile     66 y.o. 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   Assessment & Plan    1.Acute on chronic combined systolic/diastolic HF - 10/8525 echo: LVEF 45-50%, grade I dd, normal RV - 07/2021 echo: LVEF 78-24%, indet diastolic, mod RV dysfunction, moderate PASP 57 - CXR no acute process, BNP >4500 - Dec clinic wt 199, unclear reliability of hospital weights, ?209 lbs on admissoin.    -neg 1.4 L yesterday, neg 9.7 L since admisison. He is on IV lasix 80mg   bid, reported wt 197 lbs (admit 209 lbs). Stable renal function.  Appears euvoelmic, d/c IV lasix and start oral torsemide 40mg  in AM and 20mg  in PM    - medical therapy has been limited by med side effects, renal dysfunction. He is coreg 12.5mg  bid, bidil qid. Would change bidil qid to hydralzin 50mg  tid and isordil 30mg  tid to see how tolerated      2. HTN - followed in HTN clinic - difficult management due to medication side effects, - From notes has not tolerated norvasc, lisinopril, HCTZ, or beta blockers due to fatigue (though currently on coreg). Orthostatic bp's on higher dose doxazosin - had been on bidil, adjusted during admission to hydral 50mg  tid and isordil 30mg  tid.        3. CKD IV - basleine Cr 3.5 to 4  4. CAD  - He is s/p CABG in 03/2019. NST in 02/2021 showed fixed defects with minimal pero-infarct ischemia and medical management was recommended as he would not be a candidate for repeat catheterization given his worsening kidney function.  - Remains on ASA and BB. Intolerant to statins and on Repatha as an outpatient.    [place] for discharge from cardiac standpoint. He has f/u 08/04/21 already arranged. We will sign off inpatient care.    For questions or updates, please contact [place] Please consult [email or web address] for contact info under        Signed, Carlyle Dolly, MD  08/02/2021, 9:16 AM

## 2021-08-02 NOTE — Discharge Summary (Signed)
Physician Discharge Summary  [name] VFI:[id number] DOB: 1955-08-13 DOA: 07/24/2021  PCP: [name], MD  Admit date: 07/24/2021 Discharge date: 08/02/2021  Time spent: 15 minutes  Recommendations for Outpatient Follow-up:   Covid vaccination; vaccinated 2/3 believes it was [employer].  States would like Bivalent booster.  Ordered 2/15     Acute on chronic combined systolic and diastolic CHF (POC) -lvef 41-66 % 07/25/21, slightly worse or may be similar lvef 45-50% 02/04/21. -Overall all improving but slowly,Net IO Since Admission: -1,920 mL [07/27/21 1045] appreciate cardiology input increasing Lasix to 60 twice daily as he has ongoing fluid overload and edema.   -Cadiology consulted. His previous weight on January 25 was 95.6 kg-wt changes see below -Strict in and out -10.1 L, 2/20 - Daily weight Filed Weights   07/31/21 0550 08/01/21 0452 08/02/21 0619  Weight: 90.7 kg 90.1 kg 89.7 kg  -89.7 kg (198 pounds) will be patient's new base weight. -Coreg 12.5 mg BID -Cardura 4 mg daily - Hydralazine 50 mg TID -Isosorbide dinitrate 30 mg TID -Torsemide 40 mg daily/20 mg evening -Per cardiology No ACE-I/ARB/ARNI/Spiro/SGLT2 inhibitor given his CKD -Per Dr. Harl Bowie Cardiology patient has follow-up appointment on 2/22  Essential hypertension, benign- (present on admission) -Poorly controlled.  -Cardiology on board.  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   -Previous CABG 03/25/2019.   -Nuclear stress 9/22 mostly fixed basal to mild inferior defects, scar with minimal peri-infarct ischemia.  Continue diuresis hopefully with it symptoms will improve.   -Not on statin due to intolerance. -2/20 negative CP, SOB   Elevated troponin- (present on admission) -No delta 40> 39, subtle elevation in the setting of  CHF demand ischemia   Mixed hyperlipidemia- (present on admission) -Not on a statin due to intolerance.   Anemia due to chronic kidney disease - Monitor closely - Transfuse for hemoglobin<7 Lab Results  Component Value Date   HGB 8.1 (L) 08/02/2021   HGB 8.5 (L) 08/01/2021   HGB 8.4 (L) 07/31/2021   HGB 8.6 (L) 07/30/2021   HGB 8.6 (L) 07/29/2021    Obstructive sleep apnea -Cont CPAP   DM type II with hyperglycemia  -2/12 hemoglobin A1c= 6.4 Lab Results  Component Value Date   GLUCAP 118 (H) 08/02/2021   GLUCAP 146 (H) 08/01/2021   GLUCAP 161 (H) 08/01/2021   GLUCAP 218 (H) 08/01/2021   GLUCAP 126 (H) 08/01/2021  -Restart home meds  CKD (chronic kidney disease) stage 4, GFR 15-29 ml/min (POA)Baseline CR  3.5-4.   Lab Results  Component Value Date   CREATININE 3.58 (H) 08/02/2021   CREATININE 3.62 (H) 08/01/2021   CREATININE 3.67 (H) 07/31/2021   CREATININE 3.80 (H) 07/30/2021   CREATININE 3.89 (H) 07/29/2021  -At baseline -Followed by Nephrology Dr. 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 WM PMHx  malignant neoplasm of esophagus, essential hypertension, CAD, P5KD, combine systolic and diastolic heart failure, CKD stage IV, sleep apnea on BiPAP her last ED visit at [facility] 2/7 due to chest pain seen in the ED and discharged, presents W/ several weeks of worsening shortness of breath on lying flat.   In ED- tachypneic, BP was 174/103  Labw w/ anemia, BUN/creatinine 46/3.80 (creatinine within baseline range), CBG 229, BNP > 4500, troponin x2 - 36 > 39, magnesium 2.1, phosphorus 4.0.  Influenza A, B, SARS coronavirus 2 was negative.cxr-previous CABG.  Cardiomegaly,IV Lasix 60 mgx1 and admitted for chf exacerbation. Patient had clinical improvement diuretics.  Renal function with CKD remained stable. Bnp >4500-- 3538 w/ diuresis.  Seen by cardiology in consultation and managed with diuresis.  He had issues w/ elevated blood pressure  and some chest pain during hospitalization likely noncardiac, serial troponins are negative   Hospital Course:  See above   Consultations: Cardiology     Discharge Exam: Vitals:   08/02/21 0500 08/02/21 0619 08/02/21 0955 08/02/21 0958  BP: (!) 152/94  (!) 157/90   Pulse: 86   79  Resp: 17     Temp: (!) 97.5 F (36.4 C)     TempSrc: Oral     SpO2: 98%     Weight:  89.7 kg    Height:        General: A/O x4 No acute respiratory distress Eyes: negative scleral hemorrhage, negative anisocoria, negative icterus ENT: Negative Runny nose, negative gingival bleeding, Neck:  Negative scars, masses, torticollis, lymphadenopathy, JVD Lungs: Clear to auscultation bilaterally without wheezes or crackles Cardiovascular: Regular rate and rhythm without murmur gallop or rub normal S1 and S2    Discharge Instructions   Allergies as of 08/02/2021       Reactions   Bee Venom Anaphylaxis   Atorvastatin    myalgia   Diltiazem Itching   Rosuvastatin    myalgias   Valsartan Itching        Medication List     STOP taking these medications     celecoxib 200 MG capsule Commonly known as: CeleBREX   furosemide 40 MG tablet Commonly known as: LASIX   isosorbide-hydrALAZINE 20-37.5 MG tablet Commonly known as: BIDIL   Ozempic (1 MG/DOSE) 4 MG/3ML Sopn Generic drug: Semaglutide (1 MG/DOSE)       TAKE these medications    acetaminophen 325 MG tablet Commonly known as: TYLENOL Take 2 tablets (650 mg total) by mouth every 6 (six) hours as needed for mild pain, moderate pain or fever. What changed:  medication strength how much to take reasons to take this   albuterol 108 (90 Base) MCG/ACT inhaler Commonly known as: VENTOLIN HFA Inhale 2 puffs into the lungs every 6 (six) hours as needed. What changed: reasons to take this   ascorbic acid 500 MG tablet Commonly known as: VITAMIN C Take 1 tablet (500 mg total) by mouth daily. Start taking on: August 03, 2021   aspirin 81 MG EC tablet Take 4 tablets (324 mg total) by mouth daily. Swallow whole. Start taking on: August 03, 2021  B-D ULTRAFINE III SHORT PEN 31G X 8 MM Misc Generic drug: Insulin Pen Needle 1 each by Does not apply route as directed.   bacitracin ointment Apply topically 2 (two) times daily.   carvedilol 12.5 MG tablet Commonly known as: COREG Take 1 tablet (12.5 mg total) by mouth 2 (two) times daily.   COVID-19 mRNA bivalent vaccine (Pfizer) injection Inject 0.3 mLs into the muscle once for 1 dose.   doxazosin 4 MG tablet Commonly known as: CARDURA Take 4 mg by mouth daily.   EPINEPHrine 0.3 mg/0.3 mL Soaj injection Commonly known as: EPI-PEN Inject 0.3 mg into the muscle as needed for anaphylaxis.   ferrous sulfate 325 (65 FE) MG tablet Take 1 tablet (325 mg total) by mouth 2 (two) times daily with a meal.   hydrALAZINE 50 MG tablet Commonly known as: APRESOLINE Take 1 tablet (50 mg total) by mouth 3 (three) times daily.   isosorbide dinitrate 30 MG tablet Commonly known as: ISORDIL Take 1 tablet (30 mg total) by mouth 3  (three) times daily.   metoCLOPramide 10 MG tablet Commonly known as: REGLAN Take 1 tablet (10 mg total) by mouth 3 (three) times daily before meals.   multivitamin with minerals Tabs tablet Take 1 tablet by mouth daily.   nitroGLYCERIN 0.4 MG SL tablet Commonly known as: NITROSTAT Place 1 tablet (0.4 mg total) under the tongue every 5 (five) minutes as needed for chest pain.   Ondansetron 4 MG Film Take 4 mg by mouth 3 (three) times daily as needed. What changed: reasons to take this   pantoprazole 40 MG tablet Commonly known as: PROTONIX Take 1 tablet (40 mg total) by mouth 2 (two) times daily before a meal.   Repatha SureClick 086 MG/ML Soaj Generic drug: Evolocumab Inject 140 mg as directed every 14 (fourteen) days.   sertraline 100 MG tablet Commonly known as: ZOLOFT TAKE 1 TABLET BY MOUTH DAILY   Torsemide 40 MG Tabs Take 40 mg by mouth daily. Start taking on: August 03, 2021   torsemide 20 MG tablet Commonly known as: DEMADEX Take 1 tablet (20 mg total) by mouth every evening. Start taking on: August 03, 2021   traMADol 50 MG tablet Commonly known as: ULTRAM Take 25 mg by mouth every 6 (six) hours as needed for moderate pain.   TYLENOL PM EXTRA STRENGTH PO Take 2 tablets by mouth at bedtime as needed (sleep).   VITAMIN D PO Take 1,000 Units by mouth daily.       Allergies  Allergen Reactions   Bee Venom Anaphylaxis   Atorvastatin     myalgia   Diltiazem Itching   Rosuvastatin     myalgias   Valsartan Itching      The results of significant diagnostics from this hospitalization (including imaging, microbiology, ancillary and laboratory) are listed below for reference.    Significant Diagnostic Studies: DG Chest 2 View  Result Date: 07/20/2021 CLINICAL DATA:  Chest pain short of breath EXAM: CHEST - 2 VIEW COMPARISON:  03/20/2021 FINDINGS: Postop CABG. Mild cardiac enlargement without vascular congestion or edema. Mild right lower lobe  atelectasis and small right effusion. Left lung clear. IMPRESSION: Cardiac enlargement without heart failure Mild right lower lobe atelectasis and small right effusion. Electronically Signed   By: Franchot Gallo M.D.   On: 07/20/2021 13:04   NM PULMONARY VENT AND PERF (V/Q Scan)  Result Date: 07/20/2021 CLINICAL DATA:  High probability-pulmonary embolism suspected. Shortness of breath, lightheadedness, and heaviness in  chest. EXAM: NUCLEAR MEDICINE PERFUSION LUNG SCAN TECHNIQUE: Perfusion images were obtained in multiple projections after intravenous injection of radiopharmaceutical. Ventilation scans intentionally deferred if perfusion scan and chest x-ray adequate for interpretation during COVID 19 epidemic. RADIOPHARMACEUTICALS:  4.2 mCi Tc-52m MAA IV COMPARISON:  None. Correlation is made with chest two views 07/20/2021 FINDINGS: There is homogeneous distribution of perfusion radiotracer. No large segmental filling defect is seen. IMPRESSION: Normal V/Q scan (PE absent). Electronically Signed   By: Yvonne Kendall M.D.   On: 07/20/2021 18:09   NM PET Image Initial (PI) Skull Base To Thigh (F-18 FDG)  Result Date: 07/19/2021 CLINICAL DATA:  Initial treatment strategy for esophageal cancer. EXAM: NUCLEAR MEDICINE PET SKULL BASE TO THIGH TECHNIQUE: 10.9 mCi F-18 FDG was injected intravenously. Full-ring PET imaging was performed from the skull base to thigh after the radiotracer. CT data was obtained and used for attenuation correction and anatomic localization. Fasting blood glucose: 192 mg/dl COMPARISON:  None. FINDINGS: Mediastinal blood pool activity: SUV max 2.3 NECK: No hypermetabolic cervical lymph nodes are identified.There are no lesions of the pharyngeal mucosal space. Incidental CT findings: Bilateral carotid atherosclerosis. CHEST: There are no hypermetabolic mediastinal, hilar or axillary lymph nodes. There is no hypermetabolic activity associated with the esophagus. No hypermetabolic pulmonary  activity or suspicious nodularity. Incidental CT findings: Mild nonspecific distal esophageal wall thickening without hypermetabolic activity. Status post median sternotomy and CABG. The heart is mildly enlarged. There is atherosclerosis of the aorta, great vessels and coronary arteries. There are small right-greater-than-left pleural effusions with associated mild bibasilar atelectasis. Pulmonary assessment is limited by breathing artifact. ABDOMEN/PELVIS: There is no hypermetabolic activity within the liver, adrenal glands, spleen or pancreas. There is no hypermetabolic nodal activity. Incidental CT findings: Generalized soft tissue edema with small to moderate amount of ascites consistent with anasarca. There are diverticular changes of the distal colon. Small inguinal hernias containing fat are noted bilaterally. There is mild aortic and branch vessel atherosclerosis. SKELETON: There is no hypermetabolic activity to suggest osseous metastatic disease. Incidental CT findings: Multiple healing rib fractures anteriorly on the right with associated low level metabolic activity. Mild spondylosis. IMPRESSION: 1. There is no hypermetabolic activity associated with the esophagus. No hypermetabolic thoracoabdominal adenopathy or hepatic metastases identified. 2. Anasarca with generalized soft tissue edema, pleural effusions and ascites. 3. Cardiomegaly post CABG. Coronary and Aortic Atherosclerosis (ICD10-I70.0). Electronically Signed   By: Richardean Sale M.D.   On: 07/19/2021 10:21   DG Chest Port 1 View  Result Date: 07/24/2021 CLINICAL DATA:  Shortness of breath and weakness over the last several weeks. EXAM: PORTABLE CHEST 1 VIEW COMPARISON:  07/20/2021 FINDINGS: Artifact overlies the chest. Previous median sternotomy and CABG. Mild cardiomegaly. No evidence of heart failure or effusion. The lungs are clear, allowing for a slightly suboptimal inspiration. IMPRESSION: Previous CABG.  Cardiomegaly.  No active  disease suspected. Electronically Signed   By: Nelson Chimes M.D.   On: 07/24/2021 23:21   ECHOCARDIOGRAM COMPLETE  Result Date: 07/25/2021    ECHOCARDIOGRAM REPORT   Patient Name:   [name] Date of Exam: 07/25/2021 Medical Rec #:  [id number]        Height:       70.0 in Accession #:    [id number]       Weight:       209.6 lb Date of Birth:  January 01, 1956        BSA:          2.129 m Patient Age:  65 years         BP:           169/114 mmHg Patient Gender: M                HR:           87 bpm. Exam Location:  Inpatient Procedure: 2D Echo, Cardiac Doppler, Color Doppler and Intracardiac            Opacification Agent Indications:    CHF-Acute Systolic I43.32  History:        Patient has prior history of Echocardiogram examinations, most                 recent 02/04/2021. CHF, CAD and Previous Myocardial Infarction,                 Stroke; Risk Factors:Hypertension, Sleep Apnea and Diabetes.                 History of cancer.  Sonographer:    Darlina Sicilian RDCS Referring Phys: [id number] OLADAPO ADEFESO IMPRESSIONS  1. Left ventricular ejection fraction, by estimation, is 40 to 45%. The left ventricle has mildly decreased function. The left ventricle demonstrates regional wall motion abnormalities (see scoring diagram/findings for description). There is mild concentric left ventricular hypertrophy. Left ventricular diastolic function could not be evaluated. There is akinesis of the left ventricular, basal inferior wall and inferoseptal wall.  2. Right ventricular systolic function is moderately reduced. The right ventricular size is severely enlarged. There is moderately elevated pulmonary artery systolic pressure. The estimated right ventricular systolic pressure is 66.0 mmHg.  3. Left atrial size was moderately dilated.  4. The mitral valve is degenerative. Mild to moderate mitral valve regurgitation. No evidence of mitral stenosis.  5. The aortic valve is normal in structure. Aortic valve regurgitation is  not visualized. No aortic stenosis is present.  6. Aortic dilatation noted. There is mild dilatation of the aortic root, measuring 39 mm.  7. The inferior vena cava is dilated in size with <50% respiratory variability, suggesting right atrial pressure of 15 mmHg. FINDINGS  Left Ventricle: Left ventricular ejection fraction, by estimation, is 40 to 45%. The left ventricle has mildly decreased function. The left ventricle demonstrates regional wall motion abnormalities. Definity contrast agent was given IV to delineate the left ventricular endocardial borders. The left ventricular internal cavity size was normal in size. There is mild concentric left ventricular hypertrophy. Left ventricular diastolic function could not be evaluated. Right Ventricle: The right ventricular size is severely enlarged. No increase in right ventricular wall thickness. Right ventricular systolic function is moderately reduced. There is moderately elevated pulmonary artery systolic pressure. The tricuspid regurgitant velocity is 3.22 m/s, and with an assumed right atrial pressure of 15 mmHg, the estimated right ventricular systolic pressure is 63.0 mmHg. Left Atrium: Left atrial size was moderately dilated. Right Atrium: Right atrial size was normal in size. Pericardium: There is no evidence of pericardial effusion. Mitral Valve: The mitral valve is degenerative in appearance. There is mild calcification of the mitral valve leaflet(s). Mild to moderate mitral annular calcification. Mild to moderate mitral valve regurgitation. No evidence of mitral valve stenosis. Tricuspid Valve: The tricuspid valve is normal in structure. Tricuspid valve regurgitation is mild . No evidence of tricuspid stenosis. Aortic Valve: The aortic valve is normal in structure. Aortic valve regurgitation is not visualized. No aortic stenosis is present. Pulmonic Valve: The pulmonic valve was normal in structure. Pulmonic valve regurgitation is mild.  No evidence of  pulmonic stenosis. Aorta: Aortic dilatation noted. There is mild dilatation of the aortic root, measuring 39 mm. Venous: The inferior vena cava is dilated in size with less than 50% respiratory variability, suggesting right atrial pressure of 15 mmHg. IAS/Shunts: No atrial level shunt detected by color flow Doppler.  LEFT VENTRICLE PLAX 2D LVIDd:         5.40 cm      Diastology LVIDs:         4.60 cm      LV e' medial:    9.07 cm/s LV PW:         1.20 cm      LV E/e' medial:  9.9 LV IVS:        1.20 cm      LV e' lateral:   12.85 cm/s LVOT diam:     2.10 cm      LV E/e' lateral: 7.0 LV SV:         53 LV SV Index:   25 LVOT Area:     3.46 cm  LV Volumes (MOD) LV vol d, MOD A2C: 187.0 ml LV vol d, MOD A4C: 214.0 ml LV vol s, MOD A2C: 130.0 ml LV vol s, MOD A4C: 123.0 ml LV SV MOD A2C:     57.0 ml LV SV MOD A4C:     214.0 ml LV SV MOD BP:      74.7 ml RIGHT VENTRICLE RV S prime:     5.48 cm/s TAPSE (M-mode): 0.9 cm LEFT ATRIUM             Index        RIGHT ATRIUM           Index LA diam:        5.60 cm 2.63 cm/m   RA Area:     19.80 cm LA Vol (A2C):   99.2 ml 46.59 ml/m  RA Volume:   52.10 ml  24.47 ml/m LA Vol (A4C):   90.1 ml 42.31 ml/m LA Biplane Vol: 97.0 ml 45.55 ml/m  AORTIC VALVE LVOT Vmax:   82.20 cm/s LVOT Vmean:  56.000 cm/s LVOT VTI:    0.153 m  AORTA Ao Root diam: 3.90 cm Ao Asc diam:  3.60 cm MITRAL VALVE               TRICUSPID VALVE MV Area (PHT): 4.96 cm    TR Peak grad:   41.5 mmHg MV Decel Time: 153 msec    TR Vmax:        322.00 cm/s MR Peak grad: 89.1 mmHg MR Mean grad: 61.0 mmHg    SHUNTS MR Vmax:      472.00 cm/s  Systemic VTI:  0.15 m MR Vmean:     370.0 cm/s   Systemic Diam: 2.10 cm MR PISA:      1.62 cm MV E velocity: 89.55 cm/s Fransico Him MD Electronically signed by Fransico Him MD Signature Date/Time: 07/25/2021/3:20:00 PM    Final     Microbiology: Recent Results (from the past 240 hour(s))  Resp Panel by RT-PCR (Flu A&B, Covid) Nasopharyngeal Swab     Status: None    Collection Time: 07/24/21 11:43 PM   Specimen: Nasopharyngeal Swab; Nasopharyngeal(NP) swabs in vial transport medium  Result Value Ref Range Status   SARS Coronavirus 2 by RT PCR NEGATIVE NEGATIVE Final    Comment: (NOTE) SARS-CoV-2 target nucleic acids are NOT DETECTED.  The SARS-CoV-2 RNA is generally detectable  in upper respiratory specimens during the acute phase of infection. The lowest concentration of SARS-CoV-2 viral copies this assay can detect is 138 copies/mL. A negative result does not preclude SARS-Cov-2 infection and should not be used as the sole basis for treatment or other patient management decisions. A negative result may occur with  improper specimen collection/handling, submission of specimen other than nasopharyngeal swab, presence of viral mutation(s) within the areas targeted by this assay, and inadequate number of viral copies(<138 copies/mL). A negative result must be combined with clinical observations, patient history, and epidemiological information. The expected result is Negative.  Fact Sheet for Patients:  EntrepreneurPulse.com.au  Fact Sheet for Healthcare Providers:  IncredibleEmployment.be  This test is no t yet approved or cleared by the [place] FDA and  has been authorized for detection and/or diagnosis of SARS-CoV-2 by FDA under an Emergency Use Authorization (EUA). This EUA will remain  in effect (meaning this test can be used) for the duration of the COVID-19 declaration under Section 564(b)(1) of the Act, 21 U.S.C.section 360bbb-3(b)(1), unless the authorization is terminated  or revoked sooner.       Influenza A by PCR NEGATIVE NEGATIVE Final   Influenza B by PCR NEGATIVE NEGATIVE Final    Comment: (NOTE) The Xpert Xpress SARS-CoV-2/FLU/RSV plus assay is intended as an aid in the diagnosis of influenza from Nasopharyngeal swab specimens and should not be used as a sole basis for treatment.  Nasal washings and aspirates are unacceptable for Xpert Xpress SARS-CoV-2/FLU/RSV testing.  Fact Sheet for Patients: EntrepreneurPulse.com.au  Fact Sheet for Healthcare Providers: IncredibleEmployment.be  This test is not yet approved or cleared by the [place] FDA and has been authorized for detection and/or diagnosis of SARS-CoV-2 by FDA under an Emergency Use Authorization (EUA). This EUA will remain in effect (meaning this test can be used) for the duration of the COVID-19 declaration under Section 564(b)(1) of the Act, 21 U.S.C. section 360bbb-3(b)(1), unless the authorization is terminated or revoked.  Performed at [facility], [address] [place], [place] [address]      Labs: Basic Metabolic Panel: Recent Labs  Lab 07/29/21 0510 07/30/21 0443 07/31/21 0708 08/01/21 0441 08/02/21 0507  NA 139 140 138 139 135  K 3.6 3.8 3.7 3.7 3.3*  CL 107 110 108 107 106  CO2 18* 18* 19* 22 20*  GLUCOSE 141* 124* 140* 136* 135*  BUN 44* 43* 43* 42* 43*  CREATININE 3.89* 3.80* 3.67* 3.62* 3.58*  CALCIUM 8.5* 8.5* 8.5* 8.5* 8.4*  MG 2.0 2.1 2.1 2.1 2.1  PHOS 4.3 4.1 4.1 4.0 3.8   Liver Function Tests: Recent Labs  Lab 07/29/21 0510 07/30/21 0443 07/31/21 0708 08/01/21 0441 08/02/21 0507  AST 9* 8* 9* 9* 9*  ALT 8 8 9 9 8   ALKPHOS 58 57 59 59 50  BILITOT 0.4 0.5 0.5 0.6 0.6  PROT 5.9* 5.8* 5.9* 6.3* 5.5*  ALBUMIN 3.3* 3.3* 3.3* 3.4* 3.1*   No results for input(s): LIPASE, AMYLASE in the last 168 hours. No results for input(s): AMMONIA in the last 168 hours. CBC: Recent Labs  Lab 07/29/21 0510 07/30/21 0443 07/31/21 0708 08/01/21 0441 08/02/21 0507  WBC 5.1 4.9 6.1 6.0 5.8  NEUTROABS 3.1 2.9 4.0 3.9 3.5  HGB 8.6* 8.6* 8.4* 8.5* 8.1*  HCT 29.4* 28.8* 27.5* 29.1* 26.5*  MCV 89.9 89.4 89.3 88.4 87.2  PLT 221 217 217 224 219   Cardiac Enzymes: No results for input(s): CKTOTAL, CKMB, CKMBINDEX, TROPONINI in the  last  168 hours. BNP: BNP (last 3 results) Recent Labs    07/24/21 2338 07/26/21 0449  BNP >4,500.0* 3,538.0*    ProBNP (last 3 results) No results for input(s): PROBNP in the last 8760 hours.  CBG: Recent Labs  Lab 08/01/21 0745 08/01/21 1141 08/01/21 1637 08/01/21 2130 08/02/21 0743  GLUCAP 126* 218* 161* 146* 118*       Signed:  Dia Crawford, MD [facility]

## 2021-08-03 ENCOUNTER — Telehealth: Payer: Self-pay

## 2021-08-03 NOTE — Telephone Encounter (Signed)
Transition Care Management Follow-up Telephone Call Date of discharge and from where: 08/02/21 [facility] How have you been since you were released from the hospital? Pt states he is doing okay.  Any questions or concerns? No  Items Reviewed: Did the pt receive and understand the discharge instructions provided? Yes  Medications obtained and verified? Yes  Other? No  Any new allergies since your discharge? No  Dietary orders reviewed? Yes Do you have support at home? No   Home Care and Equipment/Supplies: Were home health services ordered? no If so, what is the name of the agency? N/A  Has the agency set up a time to come to the patient's home? not applicable Were any new equipment or medical supplies ordered?  No What is the name of the medical supply agency? N/A Were you able to get the supplies/equipment? no Do you have any questions related to the use of the equipment or supplies? No  Functional Questionnaire: (I = Independent and D = Dependent) ADLs: I  Bathing/Dressing- I  Meal Prep- I  Eating- I  Maintaining continence- I  Transferring/Ambulation- I  Managing Meds- I  Follow up appointments reviewed:  PCP Hospital f/u appt confirmed? Yes  Scheduled to see Dr. Wolfgang Phoenix on 08/09/21 @ 3:30. [facility] f/u appt confirmed? Yes  Scheduled to see Cardiology, Dr. Rhodia Albright on 08/04/21 @ 1:30. Are transportation arrangements needed? No  If their condition worsens, is the pt aware to call PCP or go to the Emergency Dept.? Yes Was the patient provided with contact information for the PCP's office or ED? Yes Was to pt encouraged to call back with questions or concerns? Yes

## 2021-08-03 NOTE — Addendum Note (Signed)
Addended by: Dairl Ponder on: 08/03/2021 02:21 PM   Modules accepted: Orders

## 2021-08-03 NOTE — Telephone Encounter (Signed)
Chriss Driver, LPN    Pt was not advised to follow up with you via the d/c instructions but I felt like he may need a follow up appointment because he states he has no support at home. Thank you. Mjp,lpn

## 2021-08-03 NOTE — Telephone Encounter (Signed)
Sounds fine thank you
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
## 2021-08-04 ENCOUNTER — Encounter (HOSPITAL_BASED_OUTPATIENT_CLINIC_OR_DEPARTMENT_OTHER): Payer: Self-pay | Admitting: Physician Assistant

## 2021-08-04 ENCOUNTER — Ambulatory Visit (INDEPENDENT_AMBULATORY_CARE_PROVIDER_SITE_OTHER): Payer: Medicare HMO | Admitting: Physician Assistant

## 2021-08-04 ENCOUNTER — Ambulatory Visit (INDEPENDENT_AMBULATORY_CARE_PROVIDER_SITE_OTHER): Payer: Medicare HMO | Admitting: Plastic Surgery

## 2021-08-04 ENCOUNTER — Encounter: Payer: Self-pay | Admitting: Plastic Surgery

## 2021-08-04 ENCOUNTER — Other Ambulatory Visit: Payer: Self-pay

## 2021-08-04 VITALS — BP 154/98 | HR 84 | Ht 70.0 in | Wt 203.7 lb

## 2021-08-04 DIAGNOSIS — I10 Essential (primary) hypertension: Secondary | ICD-10-CM

## 2021-08-04 DIAGNOSIS — I5042 Chronic combined systolic (congestive) and diastolic (congestive) heart failure: Secondary | ICD-10-CM | POA: Diagnosis not present

## 2021-08-04 DIAGNOSIS — I739 Peripheral vascular disease, unspecified: Secondary | ICD-10-CM | POA: Diagnosis not present

## 2021-08-04 DIAGNOSIS — I2510 Atherosclerotic heart disease of native coronary artery without angina pectoris: Secondary | ICD-10-CM | POA: Diagnosis not present

## 2021-08-04 DIAGNOSIS — T24331A Burn of third degree of right lower leg, initial encounter: Secondary | ICD-10-CM

## 2021-08-04 DIAGNOSIS — F32A Depression, unspecified: Secondary | ICD-10-CM

## 2021-08-04 MED ORDER — ISOSORBIDE DINITRATE 30 MG PO TABS: 30.0000 mg | ORAL_TABLET | Freq: Three times a day (TID) | ORAL | 5 refills | Status: DC

## 2021-08-04 MED ORDER — HYDRALAZINE HCL 50 MG PO TABS
50.0000 mg | ORAL_TABLET | Freq: Three times a day (TID) | ORAL | 5 refills | Status: DC
Start: 2021-08-04 — End: 2021-08-25

## 2021-08-04 MED ORDER — TORSEMIDE 40 MG PO TABS: 40.0000 mg | ORAL_TABLET | Freq: Every day | ORAL | 5 refills | Status: DC

## 2021-08-04 MED ORDER — TORSEMIDE 20 MG PO TABS: 20.0000 mg | ORAL_TABLET | Freq: Every evening | ORAL | 5 refills | Status: DC
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
## 2021-08-04 NOTE — Patient Instructions (Addendum)
Medication Instructions:  Your physician has recommended you make the following change in your medication:   CHANGE Torsemide to 40mg  twice daily for four days   Then return to Torsemide 40mg  in the AM and 20mg  in the afternoon   *If you need a refill on your cardiac medications before your next appointment, please call your pharmacy*   Lab Work: Your physician recommends that you return for lab work in 1-2 weeks for BMP   Please return for Lab work. You may come to the...   [facility] (3rd floor) [address], [place], [place] [address]  Open: 8am-Noon and 1pm-4:30pm   [place] [facility] [place]- Any location  **no appointments needed**   If you have labs (blood work) drawn today and your tests are completely normal, you will receive your results only by: [employer] (if you have MyChart) OR A paper copy in the mail If you have any lab test that is abnormal or we need to change your treatment, we will call you to review the results.   Testing/Procedures: None ordered today.    Follow-Up: At [facility], you and your health needs are our priority.  As part of our continuing mission to provide you with exceptional heart care, we have created designated Provider Care Teams.  These Care Teams include your primary Cardiologist (physician) and Advanced Practice Providers (APPs -  Physician Assistants and Nurse Practitioners) who all work together to provide you with the care you need, when you need it.  We recommend signing up for the patient portal called "MyChart".  Sign up information is provided on this After Visit Summary.  MyChart is used to connect with patients for Virtual Visits (Telemedicine).  Patients are able to view lab/test results, encounter notes, upcoming appointments, etc.  Non-urgent messages can be sent to your provider as well.   To learn more about what you can do with MyChart, go  to [email or web address].    Your next appointment:   3 week(s)  The format for your next appointment:   In Person  Provider:   Skeet Latch, MD or Advanced Practice Provider    Other Instructions  Recommend weighing daily and keeping a log. Please call our office if you have weight gain of 2 pounds overnight or 5 pounds in 1 week.   Date  Time Weight                                             You have been referred to psychology and psychiatry today. Psychology can help with coping mechanisms. Psychiatry can help with medication changes. Mental health is an important part of heart health. You have been referred to Dr. Elias Else. He is located at [facility] at [facility]. They will contact you to schedule an appointment. If you have not heard from them in 1 week, you may call [phone] to schedule an appointment.    Major Depressive Disorder, Adult Major depressive disorder is a mental health condition. This disorder affects feelings. It can also affect the body. Symptoms of this condition last most of the day, almost every day, for 2 weeks. This disorder can affect: Relationships. Daily activities, such as work and school. Activities that you normally like to do. What are the causes? The cause of this condition  is not known. The disorder is likely caused by a mix of things, including: Your personality, such as being a shy person. Your behavior, or how you act toward others. Your thoughts and feelings. Too much alcohol or drugs. How you react to stress. Health and mental problems that you have had for a long time. Things that hurt you in the past (trauma). Big changes in your life, such as divorce. What increases the risk? The following factors may make you more likely to develop this condition: Having family members with depression. Being a woman. Problems in the family. Low levels of some brain chemicals. Things that  caused you pain as a child, especially if you lost a parent or were abused. A lot of stress in your life, such as from: Living without basic needs of life, such as food and shelter. Being treated poorly because of race, sex, or religion (discrimination). Health and mental problems that you have had for a long time. What are the signs or symptoms? The main symptoms of this condition are: Being sad all the time. Being grouchy all the time. Loss of interest in things and activities. Other symptoms include: Sleeping too much or too little. Eating too much or too little. Gaining or losing weight, without knowing why. Feeling tired or having low energy. Being restless and weak. Feeling hopeless, worthless, or guilty. Trouble thinking clearly or making decisions. Thoughts of hurting yourself or others, or thoughts of ending your life. Spending a lot of time alone. Inability to complete common tasks of daily life. If you have very bad MDD, you may: Believe things that are not true. Hear, see, taste, or feel things that are not there. Have mild depression that lasts for at least 2 years. Feel very sad and hopeless. Have trouble speaking or moving. How is this treated? This condition may be treated with: Talk therapy. This teaches you to know bad thoughts, feelings, and actions and how to change them. This can also help you to communicate with others. This can be done with members of your family. Medicines. These can be used to treat worry (anxiety), depression, or low levels of chemicals in the brain. Lifestyle changes. You may need to: Limit alcohol use. Limit drug use. Get regular exercise. Get plenty of sleep. Make healthy eating choices. Spend more time outdoors. Brain stimulation. This treatment excites the brain. This is done when symptoms are very bad or have not gotten better with other treatments. Follow these instructions at home: Activity Get regular exercise as  told. Spend time outdoors as told. Make time to do the things you enjoy. Find ways to deal with stress. Try to: Meditate. Do deep breathing. Spend time in nature. Keep a journal. Return to your normal activities as told by your doctor. Ask your doctor what activities are safe for you. Alcohol and drug use If you drink alcohol: Limit how much you use to: 0-1 drink a day for women. 0-2 drinks a day for men. Be aware of how much alcohol is in your drink. In the [place]., one drink equals one 12 oz bottle of beer (355 mL), one 5 oz glass of wine (148 mL), or one 1 oz glass of hard liquor (44 mL). Talk to your doctor about: Alcohol use. Alcohol can affect some medicines. Any drug use. General instructions  Take over-the-counter and prescription medicines and herbal preparations only as told by your doctor. Eat a healthy diet. Get a lot of sleep. Think about joining a support  group. Your doctor may be able to suggest one. Keep all follow-up visits as told by your doctor. This is important. Where to find more information: [employer] on Mental Illness: www.nami.[place]: https://carter.com/ American Psychiatric Association: www.psychiatry.org/patients-families/ Contact a doctor if: Your symptoms get worse. You get new symptoms. Get help right away if: You hurt yourself. You have serious thoughts about hurting yourself or others. You see, hear, taste, smell, or feel things that are not there. If you ever feel like you may hurt yourself or others, or have thoughts about taking your own life, get help right away. Go to your nearest emergency department or: Call your local emergency services (911 in the [place].S.). Call a suicide crisis helpline, such as the [place] at (269)652-7771 or 988 in the [place]. This is open 24 hours a day in the [place]. Text the Crisis Text Line at 684-717-4587 (in the [place] Summary Major depressive disorder is  a mental health condition. This disorder affects feelings. Symptoms of this condition last most of the day, almost every day, for 2 weeks. The symptoms of this disorder can cause problems with relationships and with daily activities. There are treatments and support for people who get this disorder. You may need more than one type of treatment. Get help right away if you have serious thoughts about hurting yourself or others. This information is not intended to replace advice given to you by your health care provider. Make sure you discuss any questions you have with your health care provider. Document Revised: 12/23/2020 Document Reviewed: 05/11/2019 Elsevier Patient Education  2022 [employer].

## 2021-08-05 ENCOUNTER — Telehealth: Payer: Self-pay | Admitting: Physician Assistant

## 2021-08-05 NOTE — Telephone Encounter (Signed)
Please advise 

## 2021-08-05 NOTE — Telephone Encounter (Signed)
Home Health Nurse was requesting Nursing be added to the patient's referral for home health care due to the patient's cardiac history. Please call and ask for Judson Roch to further discuss patient's needs

## 2021-08-06 ENCOUNTER — Other Ambulatory Visit (INDEPENDENT_AMBULATORY_CARE_PROVIDER_SITE_OTHER): Payer: Self-pay | Admitting: Internal Medicine

## 2021-08-06 NOTE — Telephone Encounter (Signed)
Home health order verbal update given to [facility]!

## 2021-08-09 ENCOUNTER — Other Ambulatory Visit: Payer: Self-pay

## 2021-08-09 ENCOUNTER — Ambulatory Visit (HOSPITAL_COMMUNITY)
Admission: RE | Admit: 2021-08-09 | Discharge: 2021-08-09 | Disposition: A | Discharge status: Home / Self Care | Payer: Medicare HMO | Source: Ambulatory Visit | Attending: Family Medicine | Admitting: Family Medicine

## 2021-08-09 ENCOUNTER — Ambulatory Visit (INDEPENDENT_AMBULATORY_CARE_PROVIDER_SITE_OTHER): Payer: Medicare HMO | Admitting: Family Medicine

## 2021-08-09 VITALS — Wt 202.2 lb

## 2021-08-09 DIAGNOSIS — R600 Localized edema: Secondary | ICD-10-CM | POA: Diagnosis not present

## 2021-08-09 DIAGNOSIS — J811 Chronic pulmonary edema: Secondary | ICD-10-CM | POA: Diagnosis not present

## 2021-08-09 DIAGNOSIS — J019 Acute sinusitis, unspecified: Secondary | ICD-10-CM | POA: Diagnosis not present

## 2021-08-09 DIAGNOSIS — I517 Cardiomegaly: Secondary | ICD-10-CM | POA: Diagnosis not present

## 2021-08-09 DIAGNOSIS — R0982 Postnasal drip: Secondary | ICD-10-CM

## 2021-08-09 DIAGNOSIS — R918 Other nonspecific abnormal finding of lung field: Secondary | ICD-10-CM | POA: Diagnosis not present

## 2021-08-09 DIAGNOSIS — J90 Pleural effusion, not elsewhere classified: Secondary | ICD-10-CM | POA: Diagnosis not present

## 2021-08-09 DIAGNOSIS — R0609 Other forms of dyspnea: Secondary | ICD-10-CM

## 2021-08-09 MED ORDER — AMOXICILLIN-POT CLAVULANATE 875-125 MG PO TABS: 1.0000 | ORAL_TABLET | Freq: Two times a day (BID) | ORAL | 0 refills | Status: DC

## 2021-08-09 MED ORDER — FLUTICASONE PROPIONATE 50 MCG/ACT NA SUSP: 2.0000 | Freq: Every day | NASAL | 6 refills | Status: DC

## 2021-08-09 MED ORDER — HYDROXYZINE PAMOATE 25 MG PO CAPS
ORAL_CAPSULE | ORAL | 0 refills | Status: DC
Start: 2021-08-09 — End: 2021-09-15

## 2021-08-09 MED ORDER — AZELASTINE HCL 0.1 % NA SOLN: 2.0000 | Freq: Two times a day (BID) | NASAL | 12 refills | Status: DC

## 2021-08-09 NOTE — Patient Instructions (Addendum)
Hi [name]  It was good to see you today. Sorry you are having so many problems. Please monitor your weight if you notice that it is going up several pounds over several days please call cardiology-if you cannot reach cardiology notify us.  As for your sinuses we are doing a COVID test.  We will let you know the results.  I recommend Augmentin 875 mg 1 twice daily for 10 days with a snack I also recommended utilizing Flonase nasal spray 2 sprays each nostril daily And also utilizing Astelin nasal spray 2 sprays each nostril twice daily Hopefully the combination of these will help your postnasal drainage cut down.  You may take his Zyrtec with this.  You also may take hydroxyzine 25 mg 1 each evening as needed to help with your allergies as well as perhaps helping with sleep.  We will send this prescription in as well. Do not take any diphenhydramine/Benadryl/Tylenol PM with this medicine. Please do a chest x-ray we will notify you of the results.  TakeCare-[name]

## 2021-08-09 NOTE — Telephone Encounter (Signed)
Last OV 02/09/21

## 2021-08-09 NOTE — Progress Notes (Signed)
° °  Subjective:    Patient ID: [name], male    DOB: 1955-11-11, 66 y.o.   MRN: [id number]  HPI Patient arrives for a TOC follow up from a recent hospitalization for CHF. Patient also states he has a head cold. Hospital notes were reviewed in detail Having head congestion sinus pressure pain discomfort Some coughing no wheezing No vomiting or diarrhea Very nice patient Facing some very challenging issues with CHF end-stage kidney disease as well as major depression He finds it very difficult to except that he cannot do the things he used to be able to do He does relate shortness of breath and weakness Appetite not good Difficult time sleeping at night Has friends that help him out but otherwise stays by himself Recently with head congestion drainage coughing Review of Systems     Objective:   Physical Exam Lungs clear heart regular extremities with some trace edema. We did discuss watching his weight in tracking this on a regular basis we also discussed healthy eating He is currently at the top dose of sertraline for his age plus kidney disease Very important for patient to see behavioral health referral has already been made       Assessment & Plan:  1. Acute rhinosinusitis Antibiotic COVID test Rest Chest x-ray ordered   2. Pedal edema Monitor weight on a regular basis continue diuretics if weight changes more than a few pounds over 1 to 2 days or 5 pounds within a week notify us immediately or cardiology  3. Postnasal drip COVID test taken await results - Novel Coronavirus, NAA ([facility])  4. DOE (dyspnea on exertion) COVID test taken await results Chest x-ray ordered await results - DG Chest 2 View - Novel Coronavirus, NAA ([facility])  Severe depression not suicidal Continue with the current dose of sertraline but supposed to see behavioral health soon Medication dosing would have to be altered based on his kidney function

## 2021-08-11 LAB — NOVEL CORONAVIRUS, NAA: SARS-CoV-2, NAA: NOT DETECTED

## 2021-08-13 ENCOUNTER — Telehealth: Payer: Self-pay | Admitting: Cardiovascular Disease

## 2021-08-13 ENCOUNTER — Telehealth (HOSPITAL_BASED_OUTPATIENT_CLINIC_OR_DEPARTMENT_OTHER): Payer: Self-pay | Admitting: Cardiovascular Disease

## 2021-08-13 NOTE — Telephone Encounter (Signed)
-  Spoke with Inez Catalina from [facility] ?-She report after several attempts since Monday with pt not answering the phone or door, she was able to complete first home visit. ?-She state during assessment pt BP was 180/110, asymptomatic, but pt report he only took his Cardura today because he's been feeling nauseated.  ?-Nurse report she tried to encourage pt to take his other medication, but pt refused.  ?-Nurse wanted to make Dr. Oval Linsey aware.  ? ? ? ? ?

## 2021-08-13 NOTE — Telephone Encounter (Signed)
New Message: ? ? ? ? ? ?Please call Suezanne Jacquet, he is the PT for [facility].He said he just left the patient's home.He says he some concerns about the patient,especially safety in his home. ?

## 2021-08-13 NOTE — Telephone Encounter (Signed)
Pt c/o BP issue: STAT if pt c/o blurred vision, one-sided weakness or slurred speech ? ?1. What are your last 5 BP readings? 180/110 now ? ?2. Are you having any other symptoms (ex. Dizziness, headache, blurred vision, passed out)? no ? ?3. What is your BP issue? Betty nurse with [facility] calling to inform the patient's BP is 180/110. She says he has no symptoms. She can receive a call back at: [phone] ? ? ?

## 2021-08-13 NOTE — Telephone Encounter (Signed)
LCSW received referral from care team at [facility]. Multiple care/financial concerns.  ?LCSW will reach out to pt to see how our team can be of assistance, appreciate verbal permission for [facility] to also send SW to pt home to further assess.  ? ?Westley Hummer, MSW, LCSW ?Clinical Social Worker II ?[place] [facility]/V[facility] Care Navigation  ?[phone]- work cell phone (preferred) ?[phone]- desk phone ? ?

## 2021-08-13 NOTE — Telephone Encounter (Signed)
Follow Up: ? ? ? ? ?[name] from [facility] is calling you back. ?

## 2021-08-13 NOTE — Telephone Encounter (Signed)
Left message to call back  

## 2021-08-13 NOTE — Telephone Encounter (Addendum)
Spoke with [name] from [place]. He reports the following concerns: ? ?-Pt recently had a gas leak. [place] fixed it but it caused pt to have a $2,000 bill that he can't pay. They have since cut his gas off and pt is currently heating home with area heating.  ?- He report pt is a high fall risk and live by himself in a split level home. His ex-wife comes to assist occasionally. ?-Suezanne Jacquet feels pt could benefit from Education officer, museum consult and needing verbal permission to send out Education administrator. ? ?[name], NP made aware and provided verbal permission. ?Nurse will also reach out to our social worker to follow up.  ? ? ? ?

## 2021-08-16 ENCOUNTER — Telehealth: Payer: Self-pay | Admitting: Licensed Clinical Social Worker

## 2021-08-16 ENCOUNTER — Telehealth: Payer: Self-pay | Admitting: Neurology

## 2021-08-16 DIAGNOSIS — G4733 Obstructive sleep apnea (adult) (pediatric): Secondary | ICD-10-CM

## 2021-08-16 DIAGNOSIS — Z789 Other specified health status: Secondary | ICD-10-CM

## 2021-08-16 DIAGNOSIS — Z9989 Dependence on other enabling machines and devices: Secondary | ICD-10-CM

## 2021-08-16 DIAGNOSIS — G4739 Other sleep apnea: Secondary | ICD-10-CM

## 2021-08-16 NOTE — Progress Notes (Addendum)
?Heart and Vascular Care Navigation ? ?08/16/2021 ? ?[name] ?[birth year] ?[id number] ? ?Reason for Referral:  ?Multiple concerns noted by Home Health agency. ?Engaged with patient by telephone for initial visit for Heart and Vascular Care Coordination. ?                                                                                                  ?Assessment:                                     ?LCSW received call back from pt. I introduced self, role, reason for call. Pt confirmed home address, PCP, and emergency contact. He lives alone and his friend [name] does come by to assist pt with pt needs intermittantly. [name] also assists with transportation and obtaining pt medications for him. Pt denies issues with electric, water or food (receives food assistance). His gas is currently the biggest challenge for him, apparently there was a leak which [facility] repaired but they still charged him for the cost of the leak which totaled over $1000 and turned off the service when he had non payment. LCSW shared that the total cost is greater than we have the ability to assist with but I will brain storm with colleagues and see what we can offer. Pt when asked agrees that he thinks that he could utilize some assistance with ADLs in his home since he only has [name] and she is not necessarily there daily. LCSW shared I would begin that process. Shared that [facility] also may have one of their social worker come to the home to meet with pt. Encouraged pt to call me if any additional questions/concerns arise.  ? ?HRT/VAS Care Coordination   ? ? Patients Home Cardiology Office --  [place]  ? Outpatient Care Team Social Worker  ? Social Worker Name: [name] [phone]  ? Living arrangements for the past 2 months Single Family Home  ? Lives with: Self  ? Patient Current Therapist, music Medicare; Medicaid  ? Patient Has Concern With Paying Medical Bills No  ? Does Patient  Have Prescription Coverage? Yes  ? Home Assistive Devices/Equipment Cane (specify quad or straight); Walker (specify type); Wheelchair  ? DME Agency [employer]  ? [place]  ? Current home services Home PT; Home RN; Other (comment)  ordering SW per notes  ? ?  ? ? ?Social History:                                                                             ?[name] Screenings  ? ?Alcohol Screen: Not on file  ?Depression (PHQ2-9): Medium Risk  ? PHQ-2 Score: 10  ?Emergency planning/management officer  Strain: Medium Risk  ? Difficulty of Paying Living Expenses: Somewhat hard  ?Food Insecurity: No Food Insecurity  ? Worried About Charity fundraiser in the Last Year: Never true  ? Ran Out of Food in the Last Year: Never true  ?Housing: Low Risk   ? Last Housing Risk Score: 0  ?Physical Activity: Not on file  ?Social Connections: Not on file  ?Stress: Not on file  ?Tobacco Use: Medium Risk  ? Smoking Tobacco Use: Former  ? Smokeless Tobacco Use: Never  ? Passive Exposure: Not on file  ?Transportation Needs: No Transportation Needs  ? Lack of Transportation (Medical): No  ? Lack of Transportation (Non-Medical): No  ? ? ?[name] Interventions: ?Financial Resources:  Financial Strain Interventions: Other (Comment) Conservator, museum/gallery, PCS) ?DSS for financial assistance  ?Food Insecurity:  Food Insecurity Interventions: Intervention Not Indicated  ?Housing Insecurity:  Housing Interventions: Intervention Not Indicated  ?Transportation:   Transportation Interventions: Intervention Not Indicated (pt significant other drives him, will also send [employer])  ? ? ?Other Care Navigation Interventions:    ? ?Provided Pharmacy assistance resources  N/a  ? ?Follow-up plan:   ?LCSW spoke with PCP, faxed the PCS forms to their office for consideration. Pt has an appt tomorrow w/ his team. LCSW also mailed the following: my card, transportation resources, [employer] guide, and  information about PCS through [facility].  ? ? ? ?

## 2021-08-16 NOTE — Telephone Encounter (Signed)
Messages sent to patient and [employer].  ?

## 2021-08-16 NOTE — Telephone Encounter (Signed)
At 10:35 pt left vm asking for a call back for the Air pressure to be increased in his BIPAP, pt stated he is not getting enough air into his lungs at night. ?

## 2021-08-16 NOTE — Telephone Encounter (Signed)
I checked the patient's ResMed report.  Looks like he has not used it since around July 11, 2021.  I called the patient.  He states this is correct.  He states he had pneumonia and breathing issues.  He spent 2 weeks in the hospital.  He states they got him back to halfway normal.  He reports he did feel the same issue of not having enough air when he used it back in January so he reports this is not a new change.  I let him know I would consult Dr. Rexene Alberts and call him back. ?

## 2021-08-16 NOTE — Addendum Note (Signed)
Addended by: Star Age on: 08/16/2021 04:44 PM ? ? Modules accepted: Orders ? ?

## 2021-08-16 NOTE — Telephone Encounter (Signed)
Noted. I tried to reach the pt. Had to LVM. Will also send mychart message and send order to [facility] regarding pressure change.  ?

## 2021-08-16 NOTE — Telephone Encounter (Signed)
LCSW called pt to f/u on referral from team.  ?No answer at [phone], left voicemail requesting call back. ?Will re-attempt as able.  ? ?Westley Hummer, MSW, LCSW ?Clinical Social Worker II ?[place] Heart/Vascular Care Navigation  ?[phone]- work cell phone (preferred) ?[phone]- desk phone ? ?

## 2021-08-16 NOTE — Telephone Encounter (Signed)
I have very limited ASV data to review from December 2022 through 08/15/2021.  Residual AHI 4.1 which is actually at goal but if he wants to try a slightly higher pressure, I will increase his EPAP to 11 cm.  Please process order and send to his DME company. ?

## 2021-08-17 ENCOUNTER — Telehealth: Payer: Self-pay | Admitting: Licensed Clinical Social Worker

## 2021-08-17 ENCOUNTER — Other Ambulatory Visit: Payer: Self-pay

## 2021-08-17 ENCOUNTER — Ambulatory Visit (INDEPENDENT_AMBULATORY_CARE_PROVIDER_SITE_OTHER): Payer: Medicare HMO | Admitting: Family Medicine

## 2021-08-17 VITALS — BP 136/86 | HR 84 | Temp 97.8°F | Wt 204.4 lb

## 2021-08-17 DIAGNOSIS — E1159 Type 2 diabetes mellitus with other circulatory complications: Secondary | ICD-10-CM

## 2021-08-17 DIAGNOSIS — F322 Major depressive disorder, single episode, severe without psychotic features: Secondary | ICD-10-CM

## 2021-08-17 DIAGNOSIS — E1169 Type 2 diabetes mellitus with other specified complication: Secondary | ICD-10-CM

## 2021-08-17 DIAGNOSIS — E785 Hyperlipidemia, unspecified: Secondary | ICD-10-CM

## 2021-08-17 DIAGNOSIS — R600 Localized edema: Secondary | ICD-10-CM | POA: Diagnosis not present

## 2021-08-17 DIAGNOSIS — I10 Essential (primary) hypertension: Secondary | ICD-10-CM | POA: Diagnosis not present

## 2021-08-17 MED ORDER — QUETIAPINE FUMARATE 25 MG PO TABS: 25.0000 mg | ORAL_TABLET | Freq: Every day | ORAL | 5 refills | Status: DC

## 2021-08-17 NOTE — Progress Notes (Signed)
? ?  Subjective:  ? ? Patient ID: [name], male    DOB: 1956-01-02, 66 y.o.   MRN: [id number] ? ?HPI ? ?Patient here for follow up on pneumonia. Patient says not doing any better  ?He does state the cough is doing better no fevers denies shortness of breath, O2 saturation normal ?He just states he has low energy finds himself feeling bad ?Has major depression takes his antidepressant ?Is pessimistic about his future ?Not suicidal ?Has underlying heart failure on multiple medicines ?Finds himself feeling fatigued and tired ?Has CKD stage IV ?Also has anemia related to chronic kidney disease ?Long-term prognosis guarded ?Cannot fix his food well ?Cannot do his own bathing on some occasions ?Can get dressed ?Cannot do much housework ? ? ?Review of Systems ? ?   ?Objective:  ? Physical Exam ? ?General-in no acute distress ?Eyes-no discharge ?Lungs-respiratory rate normal, CTA ?CV-no murmurs,RRR ?Extremities skin warm dry no edema ?Neuro grossly normal ?Behavior normal, alert ?Patient has several staples on his leg that need removing we removed them without difficulty ? ? ?   ?Assessment & Plan:  ?1. Pedal edema ?Not severe.  Will monitor.  Need to check lab work because of medication ?- Comprehensive Metabolic Panel (CMET) ?- CBC with Differential ? ?2. Essential hypertension, benign ?Blood pressure decent control follow through with cardiology ?- Comprehensive Metabolic Panel (CMET) ?- CBC with Differential ? ?3. DM type 2 causing vascular disease ([place]) ?Diabetes fair control monitor A1c appropriately ?- Comprehensive Metabolic Panel (CMET) ?- CBC with Differential ? ?4. Hyperlipidemia associated with type 2 diabetes mellitus ([place]) ?Continue statin monitor lipids ?- Comprehensive Metabolic Panel (CMET) ?- CBC with Differential ? ?5. Depression, major, single episode, severe ([employer]) ?Continue sertraline add Seroquel at night to try to help with sleep ? ?Patient also has severe ADL issues and unable to do appropriate  care for himself with bathing as well as fixing food will fill out forms to see if he can have some assistance with this patient suffering with severe CHF, end-stage renal disease, severe major depression, and global weakness ? ?Recheck in 1 month ? ? ?

## 2021-08-17 NOTE — Telephone Encounter (Signed)
The following email was sent to [email or web address]. ? ?Good Morning! ?Thank you again for your time yesterday.  ?I have placed the following resources in the mail for your reference.  ?Transportation resources: you are eligible for [employer] through [facility] and you also should have rides through your [facility] if you ever find that [name] is unable to take you to your appointments.  ?Information about [place] that [facility] Medicaid recipients are eligible from my understanding are managed through [facility]. I sent the referral form through to your primary care doctor. If he is unable to complete this then I can ask if your cardiology provider would be willing. More information can be find here [place] [facility] [place] ([email or web address]) ?A community resource guide: this is completed through [employer] and has a good chunk of the community agencies in [place] should you want to reach out to any local agencies for assistance.  ?My contact card, my cell phone is the best way to reach me when I am in office.  ? ?I spoke with my colleagues and we were curious if [employer] had offered a payment plan or if you put a certain amount back could you get service restored? Let me know and we can proceed from there! ? ?Thank you, ?Michiel Cowboy, social work  ? ?Westley Hummer, MSW, LCSW ?[email or web address] ?Work Cell: [phone] ?Clinical Social Worker II ?[facility] [place] ? ?

## 2021-08-18 ENCOUNTER — Ambulatory Visit (HOSPITAL_BASED_OUTPATIENT_CLINIC_OR_DEPARTMENT_OTHER): Payer: Medicare HMO | Admitting: General Practice

## 2021-08-18 ENCOUNTER — Telehealth (HOSPITAL_BASED_OUTPATIENT_CLINIC_OR_DEPARTMENT_OTHER): Payer: Self-pay | Admitting: Cardiovascular Disease

## 2021-08-18 NOTE — Telephone Encounter (Signed)
Pt c/o BP issue: STAT if pt c/o blurred vision, one-sided weakness or slurred speech ? ?1. What are your last 5 BP readings? 180/100 hr 95 ? ?2. Are you having any other symptoms (ex. Dizziness, headache, blurred vision, passed out)? N/a ? ?3. What is your BP issue?   ? ?

## 2021-08-18 NOTE — Telephone Encounter (Signed)
RN called patient and he states that he forgot to take his BP meds this morning, he states when he does take it his pressure is better controlled! Per Nicholes Rough, PA orders we will not change dose until we see patient in the office next week!  ? ? ?Consulted with Nicholes Rough, PA  ? ?"Increase hydralazine to '75mg'$  TID if this is with having taken the '50mg'$ . If not leave at '50mg'$  until next patient appointment! "  ? ? ?

## 2021-08-19 ENCOUNTER — Telehealth: Payer: Self-pay | Admitting: Cardiovascular Disease

## 2021-08-19 NOTE — Telephone Encounter (Signed)
Hey just an update on this patient. He was not taking his meds yesterday when he called in with the high BP so I didn't adjust meds, this is BP today after taking meds. Do you want to change anything or just leave as it until upcoming appointment with [name], NP on 3/15 ?

## 2021-08-19 NOTE — Telephone Encounter (Signed)
Pt c/o BP issue: STAT if pt c/o blurred vision, one-sided weakness or slurred speech ? ?1. What are your last 5 BP readings?  ?08/19/21 162/110 at 1:25 PM ? ?2. Are you having any other symptoms (ex. Dizziness, headache, blurred vision, passed out)? Just fatigue and weakness which is ongoing  ? ?3. What is your BP issue? Suezanne Jacquet is calling from [place] stating the patient had elevated BP again today. Patient did take BP medications this morning around 9-10:00 AM. Reports no symptoms besides fatigue and weakness which is ongoing.   ?

## 2021-08-19 NOTE — Telephone Encounter (Signed)
RN left message for [facility] nurse with Nicholes Rough, PA recommendations!  ?

## 2021-08-19 NOTE — Telephone Encounter (Signed)
Well duh it was high. Encourage him to [place] and then take BP an hour after. Thank you! : )

## 2021-08-20 NOTE — Telephone Encounter (Signed)
August 20, 2021 ?Skeet Latch, MD ?to Meryl Crutch, RN  Me   ?   5:36 PM ?Thank you for the update.  ?

## 2021-08-23 ENCOUNTER — Telehealth: Payer: Self-pay | Admitting: Cardiovascular Disease

## 2021-08-23 DIAGNOSIS — I10 Essential (primary) hypertension: Secondary | ICD-10-CM | POA: Diagnosis not present

## 2021-08-23 DIAGNOSIS — E1169 Type 2 diabetes mellitus with other specified complication: Secondary | ICD-10-CM | POA: Diagnosis not present

## 2021-08-23 DIAGNOSIS — E785 Hyperlipidemia, unspecified: Secondary | ICD-10-CM | POA: Diagnosis not present

## 2021-08-23 DIAGNOSIS — R600 Localized edema: Secondary | ICD-10-CM | POA: Diagnosis not present

## 2021-08-23 DIAGNOSIS — E1159 Type 2 diabetes mellitus with other circulatory complications: Secondary | ICD-10-CM | POA: Diagnosis not present

## 2021-08-23 NOTE — Telephone Encounter (Signed)
Left message to call back  

## 2021-08-23 NOTE — Telephone Encounter (Signed)
ER Nurse called and wanted to let Dr. Oval Linsey know that the patient's BP was 170/104. Patient also fell twice this weekend. Please call back  ?

## 2021-08-24 ENCOUNTER — Telehealth: Payer: Self-pay | Admitting: Cardiovascular Disease

## 2021-08-24 LAB — COMPREHENSIVE METABOLIC PANEL
ALT: 54 IU/L — ABNORMAL HIGH (ref 0–44)
AST: 23 IU/L (ref 0–40)
Albumin/Globulin Ratio: 1.7 (ref 1.2–2.2)
Albumin: 4.2 g/dL (ref 3.8–4.8)
Alkaline Phosphatase: 103 IU/L (ref 44–121)
BUN/Creatinine Ratio: 8 — ABNORMAL LOW (ref 10–24)
BUN: 32 mg/dL — ABNORMAL HIGH (ref 8–27)
Bilirubin Total: 0.4 mg/dL (ref 0.0–1.2)
CO2: 16 mmol/L — ABNORMAL LOW (ref 20–29)
Calcium: 9 mg/dL (ref 8.6–10.2)
Chloride: 105 mmol/L (ref 96–106)
Creatinine, Ser: 3.84 mg/dL — ABNORMAL HIGH (ref 0.76–1.27)
Globulin, Total: 2.5 g/dL (ref 1.5–4.5)
Glucose: 268 mg/dL — ABNORMAL HIGH (ref 70–99)
Potassium: 4 mmol/L (ref 3.5–5.2)
Sodium: 140 mmol/L (ref 134–144)
Total Protein: 6.7 g/dL (ref 6.0–8.5)
eGFR: 17 mL/min/{1.73_m2} — ABNORMAL LOW (ref >59)

## 2021-08-24 LAB — CBC WITH DIFFERENTIAL/PLATELET
Basophils Absolute: 0.2 10*3/uL (ref 0.0–0.2)
Basos: 2 %
EOS (ABSOLUTE): 0.3 10*3/uL (ref 0.0–0.4)
Eos: 3 %
Hematocrit: 36.7 % — ABNORMAL LOW (ref 37.5–51.0)
Hemoglobin: 11.3 g/dL — ABNORMAL LOW (ref 13.0–17.7)
Immature Grans (Abs): 0 10*3/uL (ref 0.0–0.1)
Immature Granulocytes: 0 %
Lymphocytes Absolute: 1 10*3/uL (ref 0.7–3.1)
Lymphs: 10 %
MCH: 25.6 pg — ABNORMAL LOW (ref 26.6–33.0)
MCHC: 30.8 g/dL — ABNORMAL LOW (ref 31.5–35.7)
MCV: 83 fL (ref 79–97)
Monocytes Absolute: 0.8 10*3/uL (ref 0.1–0.9)
Monocytes: 8 %
Neutrophils Absolute: 7.8 10*3/uL — ABNORMAL HIGH (ref 1.4–7.0)
Neutrophils: 77 %
Platelets: 318 10*3/uL (ref 150–450)
RBC: 4.41 x10E6/uL (ref 4.14–5.80)
RDW: 16.3 % — ABNORMAL HIGH (ref 11.6–15.4)
WBC: 10.1 10*3/uL (ref 3.4–10.8)

## 2021-08-24 NOTE — Telephone Encounter (Signed)
168/110 bp  patient has had his meds... all other vital signs are under normal limits.. please advise  ? ?

## 2021-08-24 NOTE — Telephone Encounter (Signed)
RN returned call to [place], Therapist, sports and informed him no changes at this time as patient is being seen in office tomorrow for a BP check!  ?

## 2021-08-25 ENCOUNTER — Encounter (HOSPITAL_BASED_OUTPATIENT_CLINIC_OR_DEPARTMENT_OTHER): Payer: Self-pay | Admitting: Family

## 2021-08-25 ENCOUNTER — Telehealth: Payer: Self-pay | Admitting: Licensed Clinical Social Worker

## 2021-08-25 ENCOUNTER — Other Ambulatory Visit: Payer: Self-pay

## 2021-08-25 ENCOUNTER — Ambulatory Visit (INDEPENDENT_AMBULATORY_CARE_PROVIDER_SITE_OTHER): Payer: Medicare HMO | Admitting: Family

## 2021-08-25 VITALS — BP 158/118 | HR 85 | Ht 70.0 in | Wt 213.5 lb

## 2021-08-25 DIAGNOSIS — F32A Depression, unspecified: Secondary | ICD-10-CM

## 2021-08-25 DIAGNOSIS — I25118 Atherosclerotic heart disease of native coronary artery with other forms of angina pectoris: Secondary | ICD-10-CM | POA: Diagnosis not present

## 2021-08-25 DIAGNOSIS — I5020 Unspecified systolic (congestive) heart failure: Secondary | ICD-10-CM | POA: Diagnosis not present

## 2021-08-25 DIAGNOSIS — I10 Essential (primary) hypertension: Secondary | ICD-10-CM

## 2021-08-25 DIAGNOSIS — E785 Hyperlipidemia, unspecified: Secondary | ICD-10-CM

## 2021-08-25 DIAGNOSIS — N184 Chronic kidney disease, stage 4 (severe): Secondary | ICD-10-CM | POA: Diagnosis not present

## 2021-08-25 MED ORDER — HYDRALAZINE HCL 50 MG PO TABS: 75.0000 mg | ORAL_TABLET | Freq: Three times a day (TID) | ORAL | 5 refills | Status: DC

## 2021-08-25 MED ORDER — REPATHA SURECLICK 140 MG/ML ~~LOC~~ SOAJ: 140.0000 mg | SUBCUTANEOUS | 11 refills | Status: DC

## 2021-08-25 NOTE — Telephone Encounter (Signed)
LCSW received PCS provider request fax back from PCP office. I have securely faxed this on to [facility] for pt to be evaluated for PCS at home. I will f/u on this as able to ensure referral received by [facility]. ? ?Westley Hummer, MSW, LCSW ?Clinical Social Worker II ?[facility]  ?[phone]- work cell phone (preferred) ?[phone]- desk phone ? ?

## 2021-08-25 NOTE — Progress Notes (Signed)
? ?Office Visit  ?  ?Patient Name: [name] ?Date of Encounter: 08/25/2021 ? ?PCP:  [name], MD ?  ?[place]  ?Cardiologist:  Skeet Latch, MD ?Advanced Practice Provider:  No care team member to display ?Electrophysiologist:  None  ? ?Chief Complaint  ?  ?[name] is a 66 y.o. male with a hx of CVA, combined systolic and diastolic heart failure, PAD, esophageal cancer, CAD post CABG, HTN, MI, DM2, CKD 3, PVC  presents today for blood pressure follow up. ? ?Past Medical History  ?  ?Past Medical History:  ?Diagnosis Date  ? Anemia   ? Arthritis   ? hips shoulders  ? CAD (coronary artery disease) 03/03/2019  ? Cancer [facility])   ? Esophageal cancer 2022  ? CHF (congestive heart failure) ([place]) 2020  ? Chronic combined systolic and diastolic heart failure ([place]) 04/26/2018  ? Admitted 11/14-11/20/19-diuresed 10L  ? Depression 05/31/2021  ? Fatigue 10/14/2020  ? Hypertension   ? Myocardial infarction [facility])   ? Sleep apnea   ? uses a bipap machine  ? Stroke ([place]) 12/14/2020  ? no last weakness or paralysis  ? Type 2 diabetes mellitus with diabetic nephropathy ([place]) 04/23/2019  ? 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 ?Past Surgical History:  ?Procedure Laterality Date  ? BIOPSY  06/27/2019  ? Procedure: BIOPSY;  Surgeon: Rogene Houston, MD;  [facility];  Service: Endoscopy;;  ascending colon polyp  ? BIOPSY  10/21/2020  ? Procedure: BIOPSY;  Surgeon: Rogene Houston, MD;  [facility];  Service: Endoscopy;;  esophageal,gastric polyp  ? BIOPSY  02/24/2021  ? Procedure: BIOPSY;  Surgeon: Rogene Houston, MD;  [facility];  Service: Endoscopy;;  distal and proximal esophageal biopsies   ? CARDIAC SURGERY    ? CATARACT EXTRACTION    ? COLONOSCOPY N/A 06/27/2019  ? [name]:Diverticulosis in the entire examined colon. tubular adenoma in ascending colon, 35m tubular adenoma in prox sigmoid. external hemorrhoids  ? CORONARY ARTERY BYPASS  GRAFT N/A 03/25/2019  ? Procedure: CORONARY ARTERY BYPASS GRAFTING (CABG) X  4 USING LEFT INTERNAL MAMMARY ARTERY AND RIGHT SAPHENOUS VEIN GRAFTS;  Surgeon: LLajuana Matte MD;  [place] [facility]  Service: Open Heart Surgery;  Laterality: N/A;  ? ESOPHAGOGASTRODUODENOSCOPY (EGD) WITH PROPOFOL N/A 10/21/2020  ? rehman:Normal hypopharynx.Normal proximal esophagus and mid esophagus.Esophageal mucosal changes consistent with long-segment Barrett's esophagus. (Focal low-grade dysplasia and atypia proximally) z line irregular 37 cm from incisors, 3 cm HH, single gastric polyp (fundic gland) normal duodenal bulb/second portion of duodenum, proximal margin of Barrett's at 34 cm  ? ESOPHAGOGASTRODUODENOSCOPY (EGD) WITH PROPOFOL N/A 02/24/2021  ? Procedure: ESOPHAGOGASTRODUODENOSCOPY (EGD) WITH PROPOFOL;  Surgeon: RRogene Houston MD;  [facility];  Service: Endoscopy;  Laterality: N/A;  1:35  ? ESOPHAGOGASTRODUODENOSCOPY (EGD) WITH PROPOFOL N/A 03/21/2021  ? Procedure: ESOPHAGOGASTRODUODENOSCOPY (EGD) WITH PROPOFOL;  Surgeon: HCarol Ada MD;  [facility] [facility]  Service: Endoscopy;  Laterality: N/A;  ? EXCISION MORTON'S NEUROMA    ? EYE SURGERY Left   ? retina  ? INCISION AND DRAINAGE OF WOUND Right 06/28/2021  ? Procedure: IRRIGATION AND DEBRIDEMENT WOUND;  Surgeon: PCindra Presume MD;  [facility]  Service: Plastics;  Laterality: Right;  1.5 hour  ? POLYPECTOMY  06/27/2019  ? Procedure: POLYPECTOMY;  Surgeon: RRogene Houston MD;  [facility];  Service: Endoscopy;;  proximal sigmoid colon  ?  RIGHT/LEFT HEART CATH AND CORONARY ANGIOGRAPHY N/A 03/12/2019  ? Procedure: RIGHT/LEFT HEART CATH AND CORONARY ANGIOGRAPHY;  Surgeon: [name], MD;  Location: [place] [facility];  Service: Cardiovascular;  Laterality: N/A;  ? SHOULDER ARTHROSCOPY WITH ROTATOR CUFF REPAIR AND SUBACROMIAL DECOMPRESSION Right 08/26/2020  ? Procedure: RIGHT SHOULDER MINI OPEN ROTATOR CUFF REPAIR AND  SUBACROMIAL DECOMPRESSION WITH PATCH GRAFT;  Surgeon: Susa Day, MD;  [facility];  Service: Orthopedics;  Laterality: Right;  90 MINS ?GENERAL WITH BLOCK  ? SKIN SPLIT GRAFT Right 06/28/2021  ? Procedure: SKIN GRAFT SPLIT THICKNESS;  Surgeon: [name], MD;  Location: [place];  Service: Plastics;  Laterality: Right;  ? TEE WITHOUT CARDIOVERSION N/A 03/25/2019  ? Procedure: TRANSESOPHAGEAL ECHOCARDIOGRAM (TEE);  Surgeon: [name], MD;  Location: [place];  Service: Open Heart Surgery;  Laterality: N/A;  ? ? ?Allergies ? ?Allergies  ?Allergen Reactions  ? Bee Venom Anaphylaxis  ? Atorvastatin   ?  myalgia  ? Diltiazem Itching  ? Rosuvastatin   ?  myalgias  ? Valsartan Itching  ? ? ?History of Present Illness  ?  ?[name] is a 66 y.o. male with a hx of CVA, combined systolic and diastolic heart failure, PAD, esophageal cancer, CAD post CABG, HTN, MI, DM2, CKD 3, PVC last seen 08/04/21 ? ?Pleasant gentleman who previously worked as a Forensic psychologist in [employer]. ? ?History of four-vessel CABG October 2020.  Blood pressure control has been difficult.  He has been intolerant to amlodipine, lisinopril, hydrochlorothiazide, beta-blocker due to sensation of fatigue.  He has been started on Lasix by Dr. Moshe Cipro which has improved his lower extremity edema. ? ?At initial appointment he was started on doxazosin.  Follow-up with pharmacist to be started on valsartan.  He then had subsequent orthostatic blood pressures and doxazosin was reduced. ? ?He was evaluated in the ED 12/14/2020 noting dizziness, loss of balance.  He was not orthostatic.  MRI showed small subacute to chronic infarct in left centrum semiovale and no obvious to LVO. He was started in aspirin and recommended for outpatient follow-up with neurology at which time ASA was increased to '325mg'$ . ZIO ordered 12/24/20 due to lightheadedness with predominantly NSR, 3.5% PVC burden, 3 episodes NSVT up to 5 beats and up to 12 runs of  SVT.   ? ?Carotid duplex 01/21/21 at outside provider with no significant stenosis bilaterally. Due to exertional dyspnea myoview performed 02/2021 with infarct with mild peri-infarct ischemia recommended for medical management given CKD. When seen 05/2021 was referred to psychology due to worsening depression in setting of multiple medical issues over the last year.  ? ?He was last seen in clinic 08/04/21 noting weakness, depression, and volume up 6 pounds. Torsemide dose increased for 4 days, referred to SW, referred to home health as well as re-referred to psychology.  ? ?He presents today for follow up with his friend, [name], who helps with his medications and caretaking. BP at home has often been as high as 170s. Endorses compliance with medications - [name] helps him with pill pack. Reports no chest pain, lightheadedness, dizziness. His breathing is improved since since last seen. Notes persistent fatigue and is hopeful improving sleep hygiene and increasing strength with PT will help. We also discussed moving his second dose of TOrsemide to afternoon rather than evening. Interestingly his weight is up but reports his edema is decreased.  ? ?EKGs/Labs/Other Studies Reviewed:  ? ?The following studies were reviewed today: ? ?Echo  04/26/2018: ?- Left ventricle: The cavity size was normal. There was minimal  ?  concentric hypertrophy. Systolic function was mildly reduced. The  ?  estimated ejection fraction was in the range of 45% to 50%.  ?  Diffuse hypokinesis. Findings consistent with left ventricular  ?  diastolic dysfunction, grade indeterminate. Doppler parameters  ?  are consistent with high ventricular filling pressure.  ?- Mitral valve: Mildly calcified annulus. There was mild  ?  regurgitation.  ?- Left atrium: The atrium was severely dilated.  ?- Right atrium: The atrium was mildly dilated.  ?- Inferior vena cava: The vessel was dilated. The respirophasic  ?  diameter changes were blunted (< 50%),  consistent with elevated  ?  central venous pressure. Estimated CVP 15 mmHg.  ?  ___________________________________________________________ ? ?Right/Left Heart Cath and Coronary Angiography 03/12/2019: ?[facility]

## 2021-08-25 NOTE — Patient Instructions (Addendum)
Medication Instructions:  ?Your physician has recommended you make the following change in your medication:  ? ?INCREASE your Hydralazine to '75mg'$  three times per day.  ? ?Loel Dubonnet, NP will send a MyChart message to check in on blood pressure. If it is consistently high we will consider further increasing your Hydralazine. ? ?TAKE your second dose of Torsemide in the afternoon instead of at bedtime.  ? ?*If you need a refill on your cardiac medications before your next appointment, please call your pharmacy* ? ?Lab Work: ?None ordered today.  ? ?Testing/Procedures: ?None ordered today.  ? ?Follow-Up: ?At [facility], you and your health needs are our priority.  As part of our continuing mission to provide you with exceptional heart care, we have created designated Provider Care Teams.  These Care Teams include your primary Cardiologist (physician) and Advanced Practice Providers (APPs -  Physician Assistants and Nurse Practitioners) who all work together to provide you with the care you need, when you need it. ? ?We recommend signing up for the patient portal called "MyChart".  Sign up information is provided on this After Visit Summary.  MyChart is used to connect with patients for Virtual Visits (Telemedicine).  Patients are able to view lab/test results, encounter notes, upcoming appointments, etc.  Non-urgent messages can be sent to your provider as well.   ?To learn more about what you can do with MyChart, go to [email or web address].   ? ?Your next appointment:   ?11/01/21 at 1:55 PM with Loel Dubonnet, NP ? ?AND ? ?12/29/21 at 1:20 PM with Dr. Oval Linsey ? ?Other Instructions ?  ?You have been referred to psychology. Mental health is an important part of heart health. You have been referred to Dr. Elias Else. He is located at [facility] at [facility]. They will contact you to schedule an appointment. Please call [phone] to schedule an appointment.   ? ?Tips to  Measure your Blood Pressure Correctly ? ?Here's what you can do to ensure a correct reading: ? Don't drink a caffeinated beverage or smoke during the 30 minutes before the test. ? Sit quietly for five minutes before the test begins. ? During the measurement, sit in a chair with your feet on the floor and your arm supported so your elbow is at about heart level. ? The inflatable part of the cuff should completely cover at least 80% of your upper arm, and the cuff should be placed on bare skin, not over a shirt. ? Don't talk during the measurement. ? ?Blood pressure categories  ?Blood pressure category SYSTOLIC ?(upper number)  DIASTOLIC ?(lower number)  ?Normal Less than 120 mm Hg and Less than 80 mm Hg  ?Elevated 120-129 mm Hg and Less than 80 mm Hg  ?High blood pressure: Stage 1 hypertension 130-139 mm Hg or 80-89 mm Hg  ?High blood pressure: Stage 2 hypertension 140 mm Hg or higher or 90 mm Hg or higher  ?Hypertensive crisis (consult your doctor immediately) Higher than 180 mm Hg and/or Higher than 120 mm Hg  ?Source: American Heart Association and American Stroke Association. ?For more on getting your blood pressure under control, buy Controlling Your Blood Pressure, a Special Health Report from [facility]. ? ? ? ?  ?

## 2021-08-26 NOTE — Telephone Encounter (Signed)
Patients medications were adjusted at office visit yesterday with Overton Mam NP  ?

## 2021-08-29 ENCOUNTER — Encounter (HOSPITAL_COMMUNITY): Payer: Self-pay | Admitting: Emergency Medicine

## 2021-08-29 ENCOUNTER — Inpatient Hospital Stay (HOSPITAL_COMMUNITY)
Admission: EM | Admit: 2021-08-29 | Discharge: 2021-09-01 | DRG: 291 | Disposition: A | Discharge status: Home Health | Payer: Medicare HMO | Attending: Internal Medicine | Admitting: Internal Medicine

## 2021-08-29 ENCOUNTER — Other Ambulatory Visit: Payer: Self-pay

## 2021-08-29 ENCOUNTER — Emergency Department (HOSPITAL_COMMUNITY): Payer: Medicare HMO

## 2021-08-29 DIAGNOSIS — Z8501 Personal history of malignant neoplasm of esophagus: Secondary | ICD-10-CM

## 2021-08-29 DIAGNOSIS — E1165 Type 2 diabetes mellitus with hyperglycemia: Secondary | ICD-10-CM | POA: Diagnosis present

## 2021-08-29 DIAGNOSIS — Z794 Long term (current) use of insulin: Secondary | ICD-10-CM

## 2021-08-29 DIAGNOSIS — Z7982 Long term (current) use of aspirin: Secondary | ICD-10-CM | POA: Diagnosis not present

## 2021-08-29 DIAGNOSIS — I10 Essential (primary) hypertension: Secondary | ICD-10-CM | POA: Diagnosis not present

## 2021-08-29 DIAGNOSIS — Z043 Encounter for examination and observation following other accident: Secondary | ICD-10-CM | POA: Diagnosis not present

## 2021-08-29 DIAGNOSIS — E1159 Type 2 diabetes mellitus with other circulatory complications: Secondary | ICD-10-CM | POA: Diagnosis not present

## 2021-08-29 DIAGNOSIS — E119 Type 2 diabetes mellitus without complications: Secondary | ICD-10-CM | POA: Diagnosis present

## 2021-08-29 DIAGNOSIS — Z20822 Contact with and (suspected) exposure to covid-19: Secondary | ICD-10-CM | POA: Diagnosis not present

## 2021-08-29 DIAGNOSIS — N184 Chronic kidney disease, stage 4 (severe): Secondary | ICD-10-CM | POA: Diagnosis not present

## 2021-08-29 DIAGNOSIS — I130 Hypertensive heart and chronic kidney disease with heart failure and stage 1 through stage 4 chronic kidney disease, or unspecified chronic kidney disease: Principal | ICD-10-CM | POA: Diagnosis present

## 2021-08-29 DIAGNOSIS — F321 Major depressive disorder, single episode, moderate: Secondary | ICD-10-CM | POA: Diagnosis present

## 2021-08-29 DIAGNOSIS — I509 Heart failure, unspecified: Secondary | ICD-10-CM | POA: Diagnosis not present

## 2021-08-29 DIAGNOSIS — I2510 Atherosclerotic heart disease of native coronary artery without angina pectoris: Secondary | ICD-10-CM | POA: Diagnosis present

## 2021-08-29 DIAGNOSIS — Z87891 Personal history of nicotine dependence: Secondary | ICD-10-CM

## 2021-08-29 DIAGNOSIS — Z8673 Personal history of transient ischemic attack (TIA), and cerebral infarction without residual deficits: Secondary | ICD-10-CM

## 2021-08-29 DIAGNOSIS — I5020 Unspecified systolic (congestive) heart failure: Secondary | ICD-10-CM | POA: Diagnosis present

## 2021-08-29 DIAGNOSIS — E1122 Type 2 diabetes mellitus with diabetic chronic kidney disease: Secondary | ICD-10-CM | POA: Diagnosis not present

## 2021-08-29 DIAGNOSIS — G4733 Obstructive sleep apnea (adult) (pediatric): Secondary | ICD-10-CM | POA: Diagnosis not present

## 2021-08-29 DIAGNOSIS — R112 Nausea with vomiting, unspecified: Secondary | ICD-10-CM | POA: Diagnosis not present

## 2021-08-29 DIAGNOSIS — R45851 Suicidal ideations: Secondary | ICD-10-CM | POA: Diagnosis not present

## 2021-08-29 DIAGNOSIS — Z888 Allergy status to other drugs, medicaments and biological substances status: Secondary | ICD-10-CM | POA: Diagnosis not present

## 2021-08-29 DIAGNOSIS — I5043 Acute on chronic combined systolic (congestive) and diastolic (congestive) heart failure: Secondary | ICD-10-CM

## 2021-08-29 DIAGNOSIS — E1151 Type 2 diabetes mellitus with diabetic peripheral angiopathy without gangrene: Secondary | ICD-10-CM | POA: Diagnosis not present

## 2021-08-29 DIAGNOSIS — Z951 Presence of aortocoronary bypass graft: Secondary | ICD-10-CM

## 2021-08-29 DIAGNOSIS — N189 Chronic kidney disease, unspecified: Secondary | ICD-10-CM | POA: Diagnosis not present

## 2021-08-29 DIAGNOSIS — R1111 Vomiting without nausea: Secondary | ICD-10-CM | POA: Diagnosis not present

## 2021-08-29 DIAGNOSIS — F32A Depression, unspecified: Secondary | ICD-10-CM | POA: Diagnosis not present

## 2021-08-29 DIAGNOSIS — I252 Old myocardial infarction: Secondary | ICD-10-CM

## 2021-08-29 DIAGNOSIS — I110 Hypertensive heart disease with heart failure: Secondary | ICD-10-CM | POA: Diagnosis not present

## 2021-08-29 DIAGNOSIS — R110 Nausea: Secondary | ICD-10-CM | POA: Diagnosis not present

## 2021-08-29 DIAGNOSIS — H5461 Unqualified visual loss, right eye, normal vision left eye: Secondary | ICD-10-CM | POA: Diagnosis present

## 2021-08-29 DIAGNOSIS — I5042 Chronic combined systolic (congestive) and diastolic (congestive) heart failure: Secondary | ICD-10-CM

## 2021-08-29 DIAGNOSIS — Z79899 Other long term (current) drug therapy: Secondary | ICD-10-CM | POA: Diagnosis not present

## 2021-08-29 DIAGNOSIS — I5023 Acute on chronic systolic (congestive) heart failure: Secondary | ICD-10-CM | POA: Diagnosis not present

## 2021-08-29 DIAGNOSIS — Z91030 Bee allergy status: Secondary | ICD-10-CM | POA: Diagnosis not present

## 2021-08-29 DIAGNOSIS — R079 Chest pain, unspecified: Secondary | ICD-10-CM | POA: Diagnosis not present

## 2021-08-29 DIAGNOSIS — I517 Cardiomegaly: Secondary | ICD-10-CM | POA: Diagnosis not present

## 2021-08-29 DIAGNOSIS — J811 Chronic pulmonary edema: Secondary | ICD-10-CM | POA: Diagnosis not present

## 2021-08-29 DIAGNOSIS — R609 Edema, unspecified: Secondary | ICD-10-CM | POA: Diagnosis not present

## 2021-08-29 DIAGNOSIS — J90 Pleural effusion, not elsewhere classified: Secondary | ICD-10-CM | POA: Diagnosis not present

## 2021-08-29 LAB — CBC WITH DIFFERENTIAL/PLATELET
Abs Immature Granulocytes: 0.01 10*3/uL (ref 0.00–0.07)
Basophils Absolute: 0.1 10*3/uL (ref 0.0–0.1)
Basophils Relative: 2 %
Eosinophils Absolute: 0.2 10*3/uL (ref 0.0–0.5)
Eosinophils Relative: 2 %
HCT: 33.7 % — ABNORMAL LOW (ref 39.0–52.0)
Hemoglobin: 10.3 g/dL — ABNORMAL LOW (ref 13.0–17.0)
Immature Granulocytes: 0 %
Lymphocytes Relative: 13 %
Lymphs Abs: 1 10*3/uL (ref 0.7–4.0)
MCH: 26.3 pg (ref 26.0–34.0)
MCHC: 30.6 g/dL (ref 30.0–36.0)
MCV: 86.2 fL (ref 80.0–100.0)
Monocytes Absolute: 0.7 10*3/uL (ref 0.1–1.0)
Monocytes Relative: 9 %
Neutro Abs: 5.4 10*3/uL (ref 1.7–7.7)
Neutrophils Relative %: 74 %
Platelets: 240 10*3/uL (ref 150–400)
RBC: 3.91 MIL/uL — ABNORMAL LOW (ref 4.22–5.81)
RDW: 17.4 % — ABNORMAL HIGH (ref 11.5–15.5)
WBC: 7.3 10*3/uL (ref 4.0–10.5)
nRBC: 0 % (ref 0.0–0.2)

## 2021-08-29 LAB — MAGNESIUM: Magnesium: 2.3 mg/dL (ref 1.7–2.4)

## 2021-08-29 LAB — COMPREHENSIVE METABOLIC PANEL
ALT: 22 U/L (ref 0–44)
AST: 13 U/L — ABNORMAL LOW (ref 15–41)
Albumin: 3.7 g/dL (ref 3.5–5.0)
Alkaline Phosphatase: 85 U/L (ref 38–126)
Anion gap: 10 (ref 5–15)
BUN: 39 mg/dL — ABNORMAL HIGH (ref 8–23)
CO2: 19 mmol/L — ABNORMAL LOW (ref 22–32)
Calcium: 8.5 mg/dL — ABNORMAL LOW (ref 8.9–10.3)
Chloride: 108 mmol/L (ref 98–111)
Creatinine, Ser: 3.79 mg/dL — ABNORMAL HIGH (ref 0.61–1.24)
GFR, Estimated: 17 mL/min — ABNORMAL LOW (ref >60)
Glucose, Bld: 204 mg/dL — ABNORMAL HIGH (ref 70–99)
Potassium: 4.3 mmol/L (ref 3.5–5.1)
Sodium: 137 mmol/L (ref 135–145)
Total Bilirubin: 0.6 mg/dL (ref 0.3–1.2)
Total Protein: 6.7 g/dL (ref 6.5–8.1)

## 2021-08-29 LAB — RESP PANEL BY RT-PCR (FLU A&B, COVID) ARPGX2
Influenza A by PCR: NEGATIVE
Influenza B by PCR: NEGATIVE
SARS Coronavirus 2 by RT PCR: NEGATIVE

## 2021-08-29 LAB — TROPONIN I (HIGH SENSITIVITY)
Troponin I (High Sensitivity): 36 ng/L — ABNORMAL HIGH (ref <18)
Troponin I (High Sensitivity): 35 ng/L — ABNORMAL HIGH (ref <18)

## 2021-08-29 LAB — URINALYSIS, ROUTINE W REFLEX MICROSCOPIC
Bacteria, UA: NONE SEEN
Bilirubin Urine: NEGATIVE
Glucose, UA: 150 mg/dL — AB
Ketones, ur: NEGATIVE mg/dL
Leukocytes,Ua: NEGATIVE
Nitrite: NEGATIVE
Protein, ur: 100 mg/dL — AB
Specific Gravity, Urine: 1.011 (ref 1.005–1.030)
pH: 5 (ref 5.0–8.0)

## 2021-08-29 LAB — GLUCOSE, CAPILLARY: Glucose-Capillary: 183 mg/dL — ABNORMAL HIGH (ref 70–99)

## 2021-08-29 LAB — PROTIME-INR
INR: 1.2 (ref 0.8–1.2)
Prothrombin Time: 15.3 seconds — ABNORMAL HIGH (ref 11.4–15.2)

## 2021-08-29 LAB — BRAIN NATRIURETIC PEPTIDE: B Natriuretic Peptide: 3471 pg/mL — ABNORMAL HIGH (ref 0.0–100.0)

## 2021-08-29 LAB — LACTIC ACID, PLASMA: Lactic Acid, Venous: 0.8 mmol/L (ref 0.5–1.9)

## 2021-08-29 MED ORDER — INSULIN ASPART 100 UNIT/ML IJ SOLN: 0.0000 [IU] | Freq: Every day | INTRAMUSCULAR | Status: DC

## 2021-08-29 MED ORDER — HYDROCODONE-ACETAMINOPHEN 5-325 MG PO TABS
1.0000 | ORAL_TABLET | Freq: Once | ORAL | Status: AC
  Administered 2021-08-29: 1 via ORAL
  Filled 2021-08-29: qty 1

## 2021-08-29 MED ORDER — ISOSORBIDE DINITRATE 20 MG PO TABS
30.0000 mg | ORAL_TABLET | Freq: Three times a day (TID) | ORAL | Status: DC
  Administered 2021-08-29 – 2021-09-01 (×8): 30 mg via ORAL
  Filled 2021-08-29 (×8): qty 2

## 2021-08-29 MED ORDER — HEPARIN SODIUM (PORCINE) 5000 UNIT/ML IJ SOLN
5000.0000 [IU] | Freq: Three times a day (TID) | INTRAMUSCULAR | Status: DC
  Administered 2021-08-30 – 2021-09-01 (×7): 5000 [IU] via SUBCUTANEOUS
  Filled 2021-08-29 (×7): qty 1

## 2021-08-29 MED ORDER — LABETALOL HCL 5 MG/ML IV SOLN
10.0000 mg | INTRAVENOUS | Status: DC | PRN
Start: 2021-08-29 — End: 2021-09-01
  Filled 2021-08-29: qty 4

## 2021-08-29 MED ORDER — POLYETHYLENE GLYCOL 3350 17 G PO PACK: 17.0000 g | PACK | Freq: Every day | ORAL | Status: DC | PRN

## 2021-08-29 MED ORDER — INSULIN ASPART 100 UNIT/ML IJ SOLN
0.0000 [IU] | Freq: Three times a day (TID) | INTRAMUSCULAR | Status: DC
  Administered 2021-08-30: 3 [IU] via SUBCUTANEOUS
  Administered 2021-08-30 – 2021-08-31 (×3): 2 [IU] via SUBCUTANEOUS
  Administered 2021-08-31 (×2): 3 [IU] via SUBCUTANEOUS
  Administered 2021-09-01: 5 [IU] via SUBCUTANEOUS

## 2021-08-29 MED ORDER — LABETALOL HCL 5 MG/ML IV SOLN
10.0000 mg | Freq: Once | INTRAVENOUS | Status: AC
Start: 2021-08-29 — End: 2021-08-29
  Administered 2021-08-29: 10 mg via INTRAVENOUS

## 2021-08-29 MED ORDER — FUROSEMIDE 10 MG/ML IJ SOLN
80.0000 mg | INTRAMUSCULAR | Status: AC
  Administered 2021-08-29: 80 mg via INTRAVENOUS
  Filled 2021-08-29: qty 8

## 2021-08-29 MED ORDER — ONDANSETRON HCL 4 MG/2ML IJ SOLN
4.0000 mg | Freq: Four times a day (QID) | INTRAMUSCULAR | Status: DC | PRN
  Administered 2021-08-31: 4 mg via INTRAVENOUS
  Filled 2021-08-29: qty 2

## 2021-08-29 MED ORDER — ALBUTEROL SULFATE HFA 108 (90 BASE) MCG/ACT IN AERS: 2.0000 | INHALATION_SPRAY | Freq: Four times a day (QID) | RESPIRATORY_TRACT | Status: DC | PRN

## 2021-08-29 MED ORDER — ACETAMINOPHEN 325 MG PO TABS: 650.0000 mg | ORAL_TABLET | Freq: Four times a day (QID) | ORAL | Status: DC | PRN

## 2021-08-29 MED ORDER — LABETALOL HCL 5 MG/ML IV SOLN
INTRAVENOUS | Status: AC
  Filled 2021-08-29: qty 4

## 2021-08-29 MED ORDER — FUROSEMIDE 10 MG/ML IJ SOLN
60.0000 mg | Freq: Two times a day (BID) | INTRAMUSCULAR | Status: DC
  Administered 2021-08-30 – 2021-09-01 (×5): 60 mg via INTRAVENOUS
  Filled 2021-08-29 (×5): qty 6

## 2021-08-29 MED ORDER — ONDANSETRON HCL 4 MG PO TABS: 4.0000 mg | ORAL_TABLET | Freq: Four times a day (QID) | ORAL | Status: DC | PRN

## 2021-08-29 MED ORDER — ACETAMINOPHEN 650 MG RE SUPP: 650.0000 mg | Freq: Four times a day (QID) | RECTAL | Status: DC | PRN

## 2021-08-29 MED ORDER — ONDANSETRON HCL 4 MG/2ML IJ SOLN
4.0000 mg | Freq: Once | INTRAMUSCULAR | Status: AC
  Administered 2021-08-29: 4 mg via INTRAVENOUS
  Filled 2021-08-29: qty 2

## 2021-08-29 MED ORDER — HYDRALAZINE HCL 25 MG PO TABS
75.0000 mg | ORAL_TABLET | Freq: Three times a day (TID) | ORAL | Status: DC
  Administered 2021-08-29 – 2021-08-30 (×2): 75 mg via ORAL
  Filled 2021-08-29 (×2): qty 3

## 2021-08-29 MED ORDER — ASPIRIN EC 81 MG PO TBEC
324.0000 mg | DELAYED_RELEASE_TABLET | Freq: Every day | ORAL | Status: DC
  Administered 2021-08-30 – 2021-09-01 (×3): 324 mg via ORAL
  Filled 2021-08-29 (×3): qty 4

## 2021-08-29 MED ORDER — CARVEDILOL 12.5 MG PO TABS
12.5000 mg | ORAL_TABLET | Freq: Two times a day (BID) | ORAL | Status: DC
  Administered 2021-08-29 – 2021-09-01 (×6): 12.5 mg via ORAL
  Filled 2021-08-29 (×6): qty 1

## 2021-08-29 NOTE — Assessment & Plan Note (Addendum)
Baseline creatinine 3.5-3.8 ?-Monitor closely with diuresis. ?-Pt followed outpatient by Dr. Clover Mealy ?-phos within normal limits ?Serum creatinine 3.75 at time of dc ?

## 2021-08-29 NOTE — H&P (Addendum)
?History and Physical  ? ? ?[name] DJT:[id number] DOB: 03/07/1956 DOA: 08/29/2021 ? ?PCP: [name], MD  ? ?Patient coming from: Home ? ?I have personally briefly reviewed patient's old medical records in [place] ? ?Chief Complaint: Weakness, fall, swelling ? ?HPI: [name] is a 66 y.o. male with medical history significant for systolic and diastolic CHF, CKD4, diabetes mellitus, hypertension, CABG 2010, GI bleed, peripheral artery disease, esophageal cancer. ?Patient presented to the ED with complaints of a fall, generalized weakness, and swelling of his abdomen, hands and feet.  Reports weakness has been ongoing for the past several months.  He reports he has been having some swelling since he was discharged from the hospital 08/02/2021, but over the past 2 days, the swelling has significantly worsened.  Reports compliance with his torsemide.  As much as he can he tries to adhere to a low-salt diet.  Reports some difficulty breathing.  No chest pain. ?Reports 3 episodes of vomiting today, he has chronic nausea.  No abdominal pain, but reports abdominal swelling from fluid. ?Patient reports he was standing and fell because he felt dizzy.  He has chronic dizziness.  He uses a cane for ambulation.  [name] at bedside reports that patient has not had heating or hot water at his place for days.  In the ED, patient's weight is 213 pounds. ? ?Recent hospitalization 2/11 to 2/20 for decompensated CHF, discharge weight was 198 pounds.  Medications were also adjusted, torsemide 40 mg daily/20 mg in the evening. ? ?ED Course: Blood pressure elevated to 195/114.  Heart rate 78-88.  Blood pressure systolic 30-09.  O2 sats greater than 94% on room air. ?BNP elevated at 3471.  Creatinine at baseline 3.79.  Troponin 36.  Chest x-ray without acute abnormality.  IV Lasix 80 mg x 1 given in ED.  Hospitalist to admit for decompensated CHF. ? ?Review of Systems: As per HPI all other systems reviewed  and negative. ? ?Past Medical History:  ?Diagnosis Date  ? Anemia   ? Arthritis   ? hips shoulders  ? CAD (coronary artery disease) 03/03/2019  ? Cancer [facility])   ? Esophageal cancer 2022  ? CHF (congestive heart failure) ([place]) 2020  ? Chronic combined systolic and diastolic heart failure ([place]) 04/26/2018  ? Admitted 11/14-11/20/19-diuresed 10L  ? Depression 05/31/2021  ? Fatigue 10/14/2020  ? Hypertension   ? Myocardial infarction [facility])   ? Sleep apnea   ? uses a bipap machine  ? Stroke ([place]) 12/14/2020  ? no last weakness or paralysis  ? Type 2 diabetes mellitus with diabetic nephropathy ([place]) 04/23/2019  ? 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 ? ?Past Surgical History:  ?Procedure Laterality Date  ? BIOPSY  06/27/2019  ? Procedure: BIOPSY;  Surgeon: Rogene Houston, MD;  [facility];  Service: Endoscopy;;  ascending colon polyp  ? BIOPSY  10/21/2020  ? Procedure: BIOPSY;  Surgeon: Rogene Houston, MD;  [facility];  Service: Endoscopy;;  esophageal,gastric polyp  ? BIOPSY  02/24/2021  ? Procedure: BIOPSY;  Surgeon: Rogene Houston, MD;  [facility];  Service: Endoscopy;;  distal and proximal esophageal biopsies   ? CARDIAC SURGERY    ? CATARACT EXTRACTION    ? COLONOSCOPY N/A 06/27/2019  ? [name]:Diverticulosis in the entire examined colon. tubular adenoma in ascending colon, 7m tubular adenoma in prox sigmoid. external hemorrhoids  ? CORONARY ARTERY BYPASS GRAFT N/A 03/25/2019  ?  Procedure: CORONARY ARTERY BYPASS GRAFTING (CABG) X  4 USING LEFT INTERNAL MAMMARY ARTERY AND RIGHT SAPHENOUS VEIN GRAFTS;  Surgeon: [name], MD;  Location: [place];  Service: Open Heart Surgery;  Laterality: N/A;  ? ESOPHAGOGASTRODUODENOSCOPY (EGD) WITH PROPOFOL N/A 10/21/2020  ? rehman:Normal hypopharynx.Normal proximal esophagus and mid esophagus.Esophageal mucosal changes consistent with long-segment Barrett's esophagus. (Focal low-grade dysplasia and atypia proximally) z line  irregular 37 cm from incisors, 3 cm HH, single gastric polyp (fundic gland) normal duodenal bulb/second portion of duodenum, proximal margin of Barrett's at 34 cm  ? ESOPHAGOGASTRODUODENOSCOPY (EGD) WITH PROPOFOL N/A 02/24/2021  ? Procedure: ESOPHAGOGASTRODUODENOSCOPY (EGD) WITH PROPOFOL;  Surgeon: Rogene Houston, MD;  [facility];  Service: Endoscopy;  Laterality: N/A;  1:35  ? ESOPHAGOGASTRODUODENOSCOPY (EGD) WITH PROPOFOL N/A 03/21/2021  ? Procedure: ESOPHAGOGASTRODUODENOSCOPY (EGD) WITH PROPOFOL;  Surgeon: Carol Ada, MD;  Location: [place];  Service: Endoscopy;  Laterality: N/A;  ? EXCISION MORTON'S NEUROMA    ? EYE SURGERY Left   ? retina  ? INCISION AND DRAINAGE OF WOUND Right 06/28/2021  ? Procedure: IRRIGATION AND DEBRIDEMENT WOUND;  Surgeon: [name], MD;  Location: [place];  Service: Plastics;  Laterality: Right;  1.5 hour  ? POLYPECTOMY  06/27/2019  ? Procedure: POLYPECTOMY;  Surgeon: Rogene Houston, MD;  [facility];  Service: Endoscopy;;  proximal sigmoid colon  ? RIGHT/LEFT HEART CATH AND CORONARY ANGIOGRAPHY N/A 03/12/2019  ? Procedure: RIGHT/LEFT HEART CATH AND CORONARY ANGIOGRAPHY;  Surgeon: [name], MD;  Location: [place] [facility];  Service: Cardiovascular;  Laterality: N/A;  ? SHOULDER ARTHROSCOPY WITH ROTATOR CUFF REPAIR AND SUBACROMIAL DECOMPRESSION Right 08/26/2020  ? Procedure: RIGHT SHOULDER MINI OPEN ROTATOR CUFF REPAIR AND SUBACROMIAL DECOMPRESSION WITH PATCH GRAFT;  Surgeon: Susa Day, MD;  [facility];  Service: Orthopedics;  Laterality: Right;  90 MINS ?GENERAL WITH BLOCK  ? SKIN SPLIT GRAFT Right 06/28/2021  ? Procedure: SKIN GRAFT SPLIT THICKNESS;  Surgeon: [name], MD;  Location: [place];  Service: Plastics;  Laterality: Right;  ? TEE WITHOUT CARDIOVERSION N/A 03/25/2019  ? Procedure: TRANSESOPHAGEAL ECHOCARDIOGRAM (TEE);  Surgeon: [name], MD;  Location: [place];  Service: Open Heart Surgery;  Laterality:  N/A;  ? ? ? reports that he quit smoking about 33 years ago. His smoking use included cigarettes. He has never used smokeless tobacco. He reports that he does not currently use alcohol after a past usage of about 2.0 standard drinks per week. He reports that he does not use drugs. ? ?Allergies  ?Allergen Reactions  ? Bee Venom Anaphylaxis  ? Atorvastatin   ?  myalgia  ? Diltiazem Itching  ? Rosuvastatin   ?  myalgias  ? Valsartan Itching  ? ? ?Family History  ?Problem Relation Age of Onset  ? Colon cancer Mother   ? Heart Problems Father   ? Diabetes Father   ? Valvular heart disease Father   ? Sleep apnea Neg Hx   ? Stroke Neg Hx   ? ? ?Prior to Admission medications   ?Medication Sig Start Date End Date Taking? Authorizing Provider  ?acetaminophen (TYLENOL) 325 MG tablet Take 2 tablets (650 mg total) by mouth every 6 (six) hours as needed for mild pain, moderate pain or fever. 08/02/21   Allie Bossier, MD  ?albuterol (VENTOLIN HFA) 108 (90 Base) MCG/ACT inhaler Inhale 2 puffs into the lungs every 6 (six) hours as needed. ?Patient taking differently: Inhale 2 puffs  into the lungs every 6 (six) hours as needed for wheezing or shortness of breath. 06/24/21   Coral Spikes, DO  ?ascorbic acid (VITAMIN C) 500 MG tablet Take 1 tablet (500 mg total) by mouth daily. 08/03/21   Allie Bossier, MD  ?aspirin EC 81 MG EC tablet Take 4 tablets (324 mg total) by mouth daily. Swallow whole. 08/03/21   Allie Bossier, MD  ?azelastine (ASTELIN) 0.1 % nasal spray Place 2 sprays into both nostrils 2 (two) times daily. ?Patient not taking: Reported on 08/17/2021 08/09/21   [name], MD  ?carvedilol (COREG) 12.5 MG tablet Take 1 tablet (12.5 mg total) by mouth 2 (two) times daily. 02/05/21   Loel Dubonnet, NP  ?doxazosin (CARDURA) 4 MG tablet Take 4 mg by mouth daily.    [provider]  ?EPINEPHrine 0.3 mg/0.3 mL IJ SOAJ injection Inject 0.3 mg into the muscle as needed for anaphylaxis. 01/25/21   [name], MD   ?ferrous sulfate 325 (65 FE) MG tablet Take 1 tablet (325 mg total) by mouth 2 (two) times daily with a meal. 08/02/21   Allie Bossier, MD  ?fluticasone (FLONASE) 50 MCG/ACT nasal spray Place 2 sprays int

## 2021-08-29 NOTE — Assessment & Plan Note (Addendum)
Controlled, A1c 6.4. ?- SSI- M ?-Hold home semaglutide ?CBG (last 3)  ?Recent Labs  ?  08/30/21 ?2049 08/31/21 ?0727 08/31/21 ?1112  ?GLUCAP 156* 182* 167*  ? ? ?

## 2021-08-29 NOTE — Assessment & Plan Note (Addendum)
2+ pitting lower extremity edema, abdominal swelling, BNP elevated at 3471.  Weight gain since recent hospitalization of ~ 15lbs.  Chest x-ray clear.  Reports compliance with torsemide.  Recent echo 07/2021, EF of 40 to 45%. ?-continue 60 mg IV lasix twice daily ?-Monitor renal function closely ?-Cardiology consult ?-Strict input output, daily weights, daily BNP ?

## 2021-08-29 NOTE — Assessment & Plan Note (Addendum)
-   CPAP nightly ordered 

## 2021-08-29 NOTE — Assessment & Plan Note (Addendum)
No chest pain.  EKG similar to prior.  Troponin 36 > 35. ?ACS ruled out.  ?

## 2021-08-29 NOTE — Assessment & Plan Note (Addendum)
Suboptimally controlled.   Recent medication adjustment with hydralazine increased to 100 mg TID. ?-Resume carvedilol 12.5 twice daily, Isordil 30 3 times daily, hydralazine, ?-As needed IV labetalol 10 mg for systolic greater than 355. ?-continue IV lasix for diuresis.  ?

## 2021-08-29 NOTE — ED Provider Notes (Signed)
?[facility] ?Provider Note ? ? ?CSN: [id number] ?Arrival date & time: 08/29/21  1754 ? ?  ? ?History ? ?Chief Complaint  ?Patient presents with  ? Weakness  ? Fall  ? ? ?[name] is a 66 y.o. male. ? ? ?Weakness ?Fall ? ? ?This patient is a 66 year old male who has a known history of coronary disease status post quadruple bypass, he also has a history of hypertension which has been quite severe and is on multiple medications including Coreg, Cardura, hydralazine and Imdur.  He is a diabetic, he also has a history of congestive heart failure.  His prior echocardiogram within the last year showed that the patient had an ejection fraction around 40% with significant akinesis of a large part of the ventricle.  He had a stress test in September 2022 showing no new reversible ischemia, just chronic scar. ? ?He comes in by EMS after having significant weakness over the last couple of days.  He initially had a fall yesterday while he was by himself, tripping on the carpet.  He was able to get back up to his feet and walk however today while he was trying to use a urinal he fell again, he is severely weak and was unable to get up off of the ground by himself.  He had vomited several times today and had reported increasing swelling over his feet and legs ? ?Paramedics noted that the patient was severely hypertensive around 767 systolic, he was afebrile and had no tachycardia.  And though he was generally weak he was able to ambulate from the stretcher to the gurney in the emergency department. ? ?Home Medications ?Prior to Admission medications   ?Medication Sig Start Date End Date Taking? Authorizing Provider  ?acetaminophen (TYLENOL) 325 MG tablet Take 2 tablets (650 mg total) by mouth every 6 (six) hours as needed for mild pain, moderate pain or fever. 08/02/21   Allie Bossier, MD  ?albuterol (VENTOLIN HFA) 108 (90 Base) MCG/ACT inhaler Inhale 2 puffs into the lungs every 6 (six) hours as  needed. ?Patient taking differently: Inhale 2 puffs into the lungs every 6 (six) hours as needed for wheezing or shortness of breath. 06/24/21   Coral Spikes, DO  ?ascorbic acid (VITAMIN C) 500 MG tablet Take 1 tablet (500 mg total) by mouth daily. 08/03/21   Allie Bossier, MD  ?aspirin EC 81 MG EC tablet Take 4 tablets (324 mg total) by mouth daily. Swallow whole. 08/03/21   Allie Bossier, MD  ?azelastine (ASTELIN) 0.1 % nasal spray Place 2 sprays into both nostrils 2 (two) times daily. ?Patient not taking: Reported on 08/17/2021 08/09/21   [name], MD  ?carvedilol (COREG) 12.5 MG tablet Take 1 tablet (12.5 mg total) by mouth 2 (two) times daily. 02/05/21   Loel Dubonnet, NP  ?doxazosin (CARDURA) 4 MG tablet Take 4 mg by mouth daily.    [provider]  ?EPINEPHrine 0.3 mg/0.3 mL IJ SOAJ injection Inject 0.3 mg into the muscle as needed for anaphylaxis. 01/25/21   [name], MD  ?ferrous sulfate 325 (65 FE) MG tablet Take 1 tablet (325 mg total) by mouth 2 (two) times daily with a meal. 08/02/21   Allie Bossier, MD  ?fluticasone (FLONASE) 50 MCG/ACT nasal spray Place 2 sprays into both nostrils daily. ?Patient not taking: Reported on 08/17/2021 08/09/21   [name], MD  ?hydrALAZINE (APRESOLINE) 50 MG tablet Take 1.5 tablets (75  mg total) by mouth 3 (three) times daily. 08/25/21 02/21/22  Loel Dubonnet, NP  ?hydrOXYzine (VISTARIL) 25 MG capsule 1 nightly as needed allergies and insomnia 08/09/21   [name], MD  ?Insulin Pen Needle (B-D ULTRAFINE III SHORT PEN) 31G X 8 MM MISC 1 each by Does not apply route as directed. 05/30/19   Cassandria Anger, MD  ?isosorbide dinitrate (ISORDIL) 30 MG tablet Take 1 tablet (30 mg total) by mouth 3 (three) times daily. 08/04/21   Elgie Collard, PA-C  ?metoCLOPramide (REGLAN) 10 MG tablet TAKE ONE TABLET ('10MG'$  TOTAL) BY MOUTH THREE TIMES DAILY BEFORE MEALS 08/09/21   Gabriel Rung, NP  ?Multiple Vitamin (MULTIVITAMIN WITH MINERALS) TABS  tablet Take 1 tablet by mouth daily.    [provider]  ?nitroGLYCERIN (NITROSTAT) 0.4 MG SL tablet Place 1 tablet (0.4 mg total) under the tongue every 5 (five) minutes as needed for chest pain. 08/02/21   Allie Bossier, MD  ?ondansetron (ZOFRAN-ODT) 4 MG disintegrating tablet Take 1 tablet (4 mg total) by mouth every 8 (eight) hours as needed for nausea or vomiting. 08/09/21   Carlan, [place] L, NP  ?pantoprazole (PROTONIX) 40 MG tablet Take 1 tablet (40 mg total) by mouth 2 (two) times daily before a meal. 03/23/21   Thurnell Lose, MD  ?QUEtiapine (SEROQUEL) 25 MG tablet Take 1 tablet (25 mg total) by mouth at bedtime. ?Patient not taking: Reported on 08/25/2021 08/17/21   [name], MD  ?REPATHA SURECLICK 229 MG/ML SOAJ Inject 140 mg as directed every 14 (fourteen) days. 08/25/21   Loel Dubonnet, NP  ?Semaglutide (OZEMPIC, 1 MG/DOSE, [place]) Inject 1 mg into the skin.    [provider]  ?sertraline (ZOLOFT) 100 MG tablet TAKE 1 TABLET BY MOUTH DAILY 04/09/21   [name], MD  ?torsemide (DEMADEX) 20 MG tablet Take 1 tablet (20 mg total) by mouth every evening. 08/04/21   Elgie Collard, PA-C  ?Torsemide 40 MG TABS Take 40 mg by mouth daily. 08/04/21   Elgie Collard, PA-C  ?traMADol (ULTRAM) 50 MG tablet Take 25 mg by mouth every 6 (six) hours as needed for moderate pain.    [provider]  ?VITAMIN D PO Take 1,000 Units by mouth daily.    [provider]  ?   ? ?Allergies    ?Bee venom, Atorvastatin, Diltiazem, Rosuvastatin, and Valsartan   ? ?Review of Systems   ?Review of Systems  ?Neurological:  Positive for weakness.  ?All other systems reviewed and are negative. ? ?Physical Exam ?Updated Vital Signs ?BP (!) 195/114   Pulse 88   Temp 97.6 ?F (36.4 ?C) (Oral)   Resp 16   Ht 1.778 m ('5\' 10"'$ )   Wt 96.8 kg   SpO2 95%   BMI 30.62 kg/m?  ?Physical Exam ?Vitals and nursing note reviewed.  ?Constitutional:   ?   General: He is not in acute distress. ?    Appearance: He is well-developed.  ?HENT:  ?   Head: Normocephalic and atraumatic.  ?   Mouth/Throat:  ?   Pharynx: No oropharyngeal exudate.  ?Eyes:  ?   General: No scleral icterus.    ?   Right eye: No discharge.     ?   Left eye: No discharge.  ?   Conjunctiva/sclera: Conjunctivae normal.  ?   Pupils: Pupils are equal, round, and reactive to light.  ?Neck:  ?   Thyroid: No thyromegaly.  ?  Vascular: No JVD.  ?Cardiovascular:  ?   Rate and Rhythm: Normal rate and regular rhythm.  ?   Heart sounds: Normal heart sounds. No murmur heard. ?  No friction rub. No gallop.  ?Pulmonary:  ?   Effort: Pulmonary effort is normal. No respiratory distress.  ?   Breath sounds: Normal breath sounds. No wheezing or rales.  ?Abdominal:  ?   General: Bowel sounds are normal. There is no distension.  ?   Palpations: Abdomen is soft. There is no mass.  ?   Tenderness: There is no abdominal tenderness.  ?   Comments: Anasarca present to the level of the xiphoid  ?Musculoskeletal:     ?   General: No tenderness. Normal range of motion.  ?   Cervical back: Normal range of motion and neck supple.  ?   Right lower leg: Edema present.  ?   Left lower leg: Edema present.  ?Lymphadenopathy:  ?   Cervical: No cervical adenopathy.  ?Skin: ?   General: Skin is warm and dry.  ?   Findings: No erythema or rash.  ?Neurological:  ?   Mental Status: He is alert.  ?   Coordination: Coordination normal.  ?   Comments: Generalized weakness but the patient is able to perform all activities that request including lifting both arms, lifting both legs, there is no facial droop, his speech is clear and memory is intact  ?Psychiatric:     ?   Behavior: Behavior normal.  ? ? ?ED Results / Procedures / Treatments   ?Labs ?(all labs ordered are listed, but only abnormal results are displayed) ?Labs Reviewed  ?COMPREHENSIVE METABOLIC PANEL - Abnormal; Notable for the following components:  ?    Result Value  ? CO2 19 (*)   ? Glucose, Bld 204 (*)   ? BUN 39 (*)    ? Creatinine, Ser 3.79 (*)   ? Calcium 8.5 (*)   ? AST 13 (*)   ? GFR, Estimated 17 (*)   ? All other components within normal limits  ?BRAIN NATRIURETIC PEPTIDE - Abnormal; Notable for the following components:  ? B

## 2021-08-29 NOTE — ED Triage Notes (Signed)
Pt arrives via EMS from with reports of falling while using the urinal today. Endorses HTN, n/v and weakness today.  ? ?CBG 224 ?

## 2021-08-30 ENCOUNTER — Encounter (HOSPITAL_BASED_OUTPATIENT_CLINIC_OR_DEPARTMENT_OTHER): Payer: Self-pay

## 2021-08-30 ENCOUNTER — Other Ambulatory Visit: Payer: Self-pay

## 2021-08-30 ENCOUNTER — Ambulatory Visit: Payer: Medicare HMO | Admitting: Family Medicine

## 2021-08-30 ENCOUNTER — Other Ambulatory Visit (HOSPITAL_BASED_OUTPATIENT_CLINIC_OR_DEPARTMENT_OTHER): Payer: Self-pay | Admitting: *Deleted

## 2021-08-30 DIAGNOSIS — I5020 Unspecified systolic (congestive) heart failure: Secondary | ICD-10-CM

## 2021-08-30 DIAGNOSIS — E1159 Type 2 diabetes mellitus with other circulatory complications: Secondary | ICD-10-CM | POA: Diagnosis not present

## 2021-08-30 DIAGNOSIS — I5043 Acute on chronic combined systolic (congestive) and diastolic (congestive) heart failure: Secondary | ICD-10-CM | POA: Diagnosis not present

## 2021-08-30 DIAGNOSIS — I10 Essential (primary) hypertension: Secondary | ICD-10-CM | POA: Diagnosis not present

## 2021-08-30 DIAGNOSIS — N184 Chronic kidney disease, stage 4 (severe): Secondary | ICD-10-CM | POA: Diagnosis not present

## 2021-08-30 LAB — GLUCOSE, CAPILLARY
Glucose-Capillary: 133 mg/dL — ABNORMAL HIGH (ref 70–99)
Glucose-Capillary: 148 mg/dL — ABNORMAL HIGH (ref 70–99)
Glucose-Capillary: 170 mg/dL — ABNORMAL HIGH (ref 70–99)
Glucose-Capillary: 156 mg/dL — ABNORMAL HIGH (ref 70–99)

## 2021-08-30 LAB — BASIC METABOLIC PANEL
Anion gap: 10 (ref 5–15)
BUN: 38 mg/dL — ABNORMAL HIGH (ref 8–23)
CO2: 20 mmol/L — ABNORMAL LOW (ref 22–32)
Calcium: 8.3 mg/dL — ABNORMAL LOW (ref 8.9–10.3)
Chloride: 108 mmol/L (ref 98–111)
Creatinine, Ser: 3.81 mg/dL — ABNORMAL HIGH (ref 0.61–1.24)
GFR, Estimated: 17 mL/min — ABNORMAL LOW (ref >60)
Glucose, Bld: 147 mg/dL — ABNORMAL HIGH (ref 70–99)
Potassium: 4 mmol/L (ref 3.5–5.1)
Sodium: 138 mmol/L (ref 135–145)

## 2021-08-30 MED ORDER — CARVEDILOL 12.5 MG PO TABS: 12.5000 mg | ORAL_TABLET | Freq: Two times a day (BID) | ORAL | 3 refills | Status: DC

## 2021-08-30 MED ORDER — OXYCODONE HCL 5 MG PO TABS
5.0000 mg | ORAL_TABLET | Freq: Four times a day (QID) | ORAL | Status: DC | PRN
  Administered 2021-08-30: 2.5 mg via ORAL
  Administered 2021-08-31 (×2): 5 mg via ORAL
  Filled 2021-08-30 (×3): qty 1

## 2021-08-30 MED ORDER — HYDRALAZINE HCL 25 MG PO TABS
100.0000 mg | ORAL_TABLET | Freq: Three times a day (TID) | ORAL | Status: DC
  Administered 2021-08-30 – 2021-09-01 (×6): 100 mg via ORAL
  Filled 2021-08-30 (×6): qty 4
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
## 2021-08-30 NOTE — Hospital Course (Addendum)
66 y.o. male with medical history significant for systolic and diastolic CHF, CKD4, diabetes mellitus, hypertension, CABG 2010, GI bleed, peripheral artery disease, esophageal cancer. ?Patient presented to the ED with complaints of a fall, generalized weakness, and swelling of his abdomen, hands and feet.  Reports weakness has been ongoing for the past several months.  He reports he has been having some swelling since he was discharged from the hospital 08/02/2021, but over the past 2 days, the swelling has significantly worsened.  Reports compliance with his torsemide.  As much as he can he tries to adhere to a low-salt diet.  Reports some difficulty breathing.  No chest pain. ?Reports 3 episodes of vomiting today, he has chronic nausea.  No abdominal pain, but reports abdominal swelling from fluid. ?Patient reports he was standing and fell because he felt dizzy.  He has chronic dizziness.  He uses a cane for ambulation.  [name] at bedside reports that patient has not had heating or hot water at his place for days.  In the ED, patient's weight is 213 pounds. ?  ?Recent hospitalization 2/11 to 2/20 for decompensated CHF, discharge weight was 198 pounds.  Medications were also adjusted, torsemide 40 mg daily/20 mg in the evening. ?  ?ED Course: Blood pressure elevated to 195/114.  Heart rate 78-88.  Blood pressure systolic 22-29.  O2 sats greater than 94% on room air. ?BNP elevated at 3471.  Creatinine at baseline 3.79.  Troponin 36.  Chest x-ray without acute abnormality.  IV Lasix 80 mg x 1 given in ED.  Hospitalist to admit for decompensated CHF. ? ?08/30/2021:  pt has diuresed nearly 2.5L since admission and starting to feel better. He is not back to his baseline and his weight is up.  Cardiology consulted and recommendations to continue IV lasix.   Creatinine holding stable.  Repeat labs in AM.  ? ?08/31/2021: renal function stable, continues to diurese well, sitting up in chair feeling much better,  cardiology recommending 1 more day of IV lasix diuresis and possible  ? ?09/01/21:  cleared for d/c by cardiology with torsemide 40 mg daily.  D/c weight 203.2lbs.  patient stable on RA with no dyspnea or cp. ?

## 2021-08-30 NOTE — Assessment & Plan Note (Addendum)
2+ pitting lower extremity edema, abdominal swelling, BNP elevated at 3471.  Weight gain since recent hospitalization of ~ 15lbs.  Chest x-ray clear.  Reports compliance with torsemide.  Recent echo 07/2021, EF of 40 to 45%. ?-continued 60 mg IV lasix twice daily ?-Monitor renal function closely ?-Cardiology consult appreciated ?-d/c weight 203.2 lbs (NEG 10 lbs) ? ?

## 2021-08-30 NOTE — Telephone Encounter (Signed)
Rx(s) sent to pharmacy electronically.  

## 2021-08-30 NOTE — Progress Notes (Signed)
Patient refusing CPAP for tonight. 

## 2021-08-30 NOTE — Progress Notes (Signed)
PROGRESS NOTE   [name]  ZOX:[id number] DOB: 1955-07-17 DOA: 08/29/2021 PCP: Babs Sciara, MD   Chief Complaint  Patient presents with   Weakness   Fall   Level of care: Telemetry  Brief Admission History:  66 y.o. male with medical history significant for systolic and diastolic CHF, CKD4, diabetes mellitus, hypertension, CABG 2010, GI bleed, peripheral artery disease, esophageal cancer. Patient presented to the ED with complaints of a fall, generalized weakness, and swelling of his abdomen, hands and feet.  Reports weakness has been ongoing for the past several months.  He reports he has been having some swelling since he was discharged from the hospital 08/02/2021, but over the past 2 days, the swelling has significantly worsened.  Reports compliance with his torsemide.  As much as he can he tries to adhere to a low-salt diet.  Reports some difficulty breathing.  No chest pain. Reports 3 episodes of vomiting today, he has chronic nausea.  No abdominal pain, but reports abdominal swelling from fluid. Patient reports he was standing and fell because he felt dizzy.  He has chronic dizziness.  He uses a cane for ambulation.  Gaspar Cola at bedside reports that patient has not had heating or hot water at his place for days.  In the ED, patient's weight is 213 pounds.   Recent hospitalization 2/11 to 2/20 for decompensated CHF, discharge weight was 198 pounds.  Medications were also adjusted, torsemide 40 mg daily/20 mg in the evening.   ED Course: Blood pressure elevated to 195/114.  Heart rate 78-88.  Blood pressure systolic 13-23.  O2 sats greater than 94% on room air. BNP elevated at 3471.  Creatinine at baseline 3.79.  Troponin 36.  Chest x-ray without acute abnormality.  IV Lasix 80 mg x 1 given in ED.  Hospitalist to admit for decompensated CHF.  08/30/2021:  pt has diuresed nearly 2.5L since admission and starting to feel better. He is not back to his baseline and his weight is  up.  Cardiology consulted and recommendations to continue IV lasix.   Creatinine holding stable.  Repeat labs in AM.    Assessment and Plan: * Acute on chronic combined systolic and diastolic CHF (congestive heart failure) (HCC) 2+ pitting lower extremity edema, abdominal swelling, BNP elevated at 3471.  Weight gain since recent hospitalization of ~ 15lbs.  Chest x-ray clear.  Reports compliance with torsemide.  Recent echo 07/2021, EF of 40 to 45%. -IV Lasix 80 mg x 1 given in ED, will continue 60 twice daily -Monitor renal function closely -Cardiology consult -Strict input output, daily weights, daily BNP  HFrEF (heart failure with reduced ejection fraction) (HCC) He is diuresing well on IV lasix.     Intake/Output Summary (Last 24 hours) at 08/30/2021 1210 Last data filed at 08/30/2021 0932 Gross per 24 hour  Intake 520 ml  Output 2700 ml  Net -2180 ml     Obstructive sleep apnea CPAP nightly ordered.   CKD (chronic kidney disease) stage 4, GFR 15-29 ml/min (HCC) Creatinine 3.79.  At baseline. -Monitor closely with diuresis. -Pt followed outpatient by Dr. Lacy Duverney -add phos in AM  DM type 2 causing vascular disease (HCC) Controlled, A1c 6.4. - SSI- M -Hold home semaglutide CBG (last 3)  Recent Labs    08/29/21 2326 08/30/21 0720 08/30/21 1112  GLUCAP 183* 133* 148*     S/P CABG (coronary artery bypass graft) No chest pain.  EKG similar to prior.  Troponin 36 > 35.  No symptoms of ACS.  Essential hypertension, benign Suboptimally controlled.  Elevated to 190s systolic. Recent medication adjustment with hydralazine increased to 100 mg TID. -Resume carvedilol 12.5 twice daily, Imdur 30 3 times daily, hydralazine, -As needed IV labetalol 10 mg for systolic greater than 180. -continue IV lasix for diuresis.   DVT prophylaxis: Bernard heparin Code Status: Full  Family Communication:  Disposition: Status is: Inpatient Remains inpatient appropriate because: IV lasix for  diuresis required   Consultants:  HeartCare Procedures:   Antimicrobials:    Subjective: Pt says that he is urinating better and starting to have less SOB.  No palpitations.  No CP.  Objective: Vitals:   08/30/21 0619 08/30/21 0834 08/30/21 1003 08/30/21 1207  BP: (!) 164/101 (!) 175/115 125/67 (!) 156/95  Pulse: 83  79 77  Resp: 16  18 18   Temp: 97.7 F (36.5 C)  98 F (36.7 C) 97.7 F (36.5 C)  TempSrc: Oral  Oral Oral  SpO2: 98%  94% 98%  Weight: 95.8 kg     Height:        Intake/Output Summary (Last 24 hours) at 08/30/2021 1213 Last data filed at 08/30/2021 0932 Gross per 24 hour  Intake 520 ml  Output 2700 ml  Net -2180 ml   Filed Weights   08/29/21 1800 08/29/21 2309 08/30/21 0619  Weight: 96.8 kg 95.9 kg 95.8 kg   Examination:  General exam: awake, alert, cooperative, appears volume overloaded, pleasant, NAD. Appears calm and comfortable  Respiratory system: rare rales at bases. Respiratory effort normal. Cardiovascular system: normal S1 & S2 heard. No JVD, murmurs, rubs, gallops or clicks. 1+ pedal edema. Gastrointestinal system: Abdomen is nondistended, soft and nontender. No organomegaly or masses felt. Normal bowel sounds heard. Central nervous system: Alert and oriented. No focal neurological deficits. Extremities: 1+ bilateral pretibial edema. Symmetric 5 x 5 power. Skin: No rashes, lesions or ulcers. Psychiatry: Judgement and insight appear normal. Mood & affect appropriate.   Data Reviewed: I have personally reviewed following labs and imaging studies  CBC: Recent Labs  Lab 08/29/21 1806  WBC 7.3  NEUTROABS 5.4  HGB 10.3*  HCT 33.7*  MCV 86.2  PLT 240    Basic Metabolic Panel: Recent Labs  Lab 08/29/21 1806 08/30/21 0523  NA 137 138  K 4.3 4.0  CL 108 108  CO2 19* 20*  GLUCOSE 204* 147*  BUN 39* 38*  CREATININE 3.79* 3.81*  CALCIUM 8.5* 8.3*  MG 2.3  --     CBG: Recent Labs  Lab 08/29/21 2326 08/30/21 0720 08/30/21 1112   GLUCAP 183* 133* 148*    Recent Results (from the past 240 hour(s))  Resp Panel by RT-PCR (Flu A&B, Covid) Nasopharyngeal Swab     Status: None   Collection Time: 08/29/21  6:10 PM   Specimen: Nasopharyngeal Swab; Nasopharyngeal(NP) swabs in vial transport medium  Result Value Ref Range Status   SARS Coronavirus 2 by RT PCR NEGATIVE NEGATIVE Final    Comment: (NOTE) SARS-CoV-2 target nucleic acids are NOT DETECTED.  The SARS-CoV-2 RNA is generally detectable in upper respiratory specimens during the acute phase of infection. The lowest concentration of SARS-CoV-2 viral copies this assay can detect is 138 copies/mL. A negative result does not preclude SARS-Cov-2 infection and should not be used as the sole basis for treatment or other patient management decisions. A negative result may occur with  improper specimen collection/handling, submission of specimen other than nasopharyngeal swab, presence of viral mutation(s) within the areas  targeted by this assay, and inadequate number of viral copies(<138 copies/mL). A negative result must be combined with clinical observations, patient history, and epidemiological information. The expected result is Negative.  Fact Sheet for Patients:  BloggerCourse.com  Fact Sheet for Healthcare Providers:  SeriousBroker.it  This test is no t yet approved or cleared by the [place] FDA and  has been authorized for detection and/or diagnosis of SARS-CoV-2 by FDA under an Emergency Use Authorization (EUA). This EUA will remain  in effect (meaning this test can be used) for the duration of the COVID-19 declaration under Section 564(b)(1) of the Act, 21 U.S.C.section 360bbb-3(b)(1), unless the authorization is terminated  or revoked sooner.       Influenza A by PCR NEGATIVE NEGATIVE Final   Influenza B by PCR NEGATIVE NEGATIVE Final    Comment: (NOTE) The Xpert Xpress SARS-CoV-2/FLU/RSV plus  assay is intended as an aid in the diagnosis of influenza from Nasopharyngeal swab specimens and should not be used as a sole basis for treatment. Nasal washings and aspirates are unacceptable for Xpert Xpress SARS-CoV-2/FLU/RSV testing.  Fact Sheet for Patients: BloggerCourse.com  Fact Sheet for Healthcare Providers: SeriousBroker.it  This test is not yet approved or cleared by the [place] FDA and has been authorized for detection and/or diagnosis of SARS-CoV-2 by FDA under an Emergency Use Authorization (EUA). This EUA will remain in effect (meaning this test can be used) for the duration of the COVID-19 declaration under Section 564(b)(1) of the Act, 21 U.S.C. section 360bbb-3(b)(1), unless the authorization is terminated or revoked.  Performed at [facility], [address] [place], [place] [address]      Radiology Studies: [facility] Chest [facility] 1 View  Result Date: 08/29/2021 CLINICAL DATA:  Fall. EXAM: PORTABLE CHEST 1 VIEW COMPARISON:  Chest x-ray dated August 09, 2021. FINDINGS: Unchanged mild cardiomegaly status post CABG. Chronic mild pulmonary vascular congestion. Resolved right lower lobe opacity. No focal consolidation, pleural effusion, or pneumothorax. No acute osseous abnormality. IMPRESSION: 1. No active disease. Resolved right lower lobe airspace disease. Electronically Signed   By: Obie Dredge M.D.   On: 08/29/2021 18:36    Scheduled Meds:  aspirin EC  324 mg Oral Daily   carvedilol  12.5 mg Oral BID   furosemide  60 mg Intravenous BID   heparin  5,000 Units Subcutaneous Q8H   hydrALAZINE  100 mg Oral TID   insulin aspart  0-15 Units Subcutaneous TID WC   insulin aspart  0-5 Units Subcutaneous QHS   isosorbide dinitrate  30 mg Oral TID   Continuous Infusions:   LOS: 1 day   Time spent: 37 mins  Lavinia Mcneely Laural Benes, MD How to contact the [facility] Attending or Consulting provider 7A - 7P or covering provider  during after hours 7P -7A, for this patient?  Check the care team in [facility] and look for a) attending/consulting [facility] provider listed and b) the [facility] team listed Log into [email or web address] and use [place]'s universal password to access. If you do not have the password, please contact the hospital operator. Locate the [facility] provider you are looking for under [facility] and page to a number that you can be directly reached. If you still have difficulty reaching the provider, please page the [facility] (Director on Call) for the Hospitalists listed on amion for assistance.  08/30/2021, 12:13 PM

## 2021-08-30 NOTE — Progress Notes (Signed)
Patient declined CPAP for sleep. ?

## 2021-08-30 NOTE — Progress Notes (Signed)
Patient c/o right leg pain. States he takes tramadol prn at home. Notified Dr. Wynetta Emery to make aware.  ?

## 2021-08-30 NOTE — Consult Note (Addendum)
?Cardiology Consultation:  ? ?Patient ID: [name] ?MRN: [id number]; DOB: 1956-03-15 ? ?Admit date: 08/29/2021 ?Date of Consult: 08/30/2021 ? ?PCP:  [name], MD ?  ?[facility] Providers ?Cardiologist:  Skeet Latch, MD      ? ? ?Patient Profile:  ? ?[name] is a 66 y.o. male with a hx of chronic combined CHF, esophageal CA, CVA, CAD  who is being seen 08/30/2021 for the evaluation of CHF at the request of Dr. Wynetta Emery. ? ?History of Present Illness:  ? ?[name]  has a with a hx of CVA, combined systolic and diastolic heart failure, PAD, esophageal cancer, CAD post CABG 2020, HTN difficult to control, intolerant to amlodipine, lisinopril, hydrochlorothiazide, beta-blocker due to sensation of fatigue, MI, DM2, CKD 3, PVC. ? ?Last seen in clinic 08/25/21 and BP elevated. Weight up but edema down. Hydralazine increased 75 mg TID.  Patient says edema worsened and became short of breath. Admits to eating canned soups daily but trying to go salt free. BNP 3471, Has diuresed 2.1 L and is feeling better but Crt 3.81 ? ? ?Past Medical History:  ?Diagnosis Date  ? Anemia   ? Arthritis   ? hips shoulders  ? CAD (coronary artery disease) 03/03/2019  ? Cancer [facility])   ? Esophageal cancer 2022  ? CHF (congestive heart failure) ([place]) 2020  ? Chronic combined systolic and diastolic heart failure ([place]) 04/26/2018  ? Admitted 11/14-11/20/19-diuresed 10L  ? Depression 05/31/2021  ? Fatigue 10/14/2020  ? Hypertension   ? Myocardial infarction [facility])   ? Sleep apnea   ? uses a bipap machine  ? Stroke ([place]) 12/14/2020  ? no last weakness or paralysis  ? Type 2 diabetes mellitus with diabetic nephropathy ([place]) 04/23/2019  ? 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 ? ?Past Surgical History:  ?Procedure Laterality Date  ? BIOPSY  06/27/2019  ? Procedure: BIOPSY;  Surgeon: Rogene Houston, MD;  [facility];  Service: Endoscopy;;  ascending colon polyp  ? BIOPSY  10/21/2020  ? Procedure: BIOPSY;   Surgeon: Rogene Houston, MD;  [facility];  Service: Endoscopy;;  esophageal,gastric polyp  ? BIOPSY  02/24/2021  ? Procedure: BIOPSY;  Surgeon: Rogene Houston, MD;  [facility];  Service: Endoscopy;;  distal and proximal esophageal biopsies   ? CARDIAC SURGERY    ? CATARACT EXTRACTION    ? COLONOSCOPY N/A 06/27/2019  ? [name]:Diverticulosis in the entire examined colon. tubular adenoma in ascending colon, 70m tubular adenoma in prox sigmoid. external hemorrhoids  ? CORONARY ARTERY BYPASS GRAFT N/A 03/25/2019  ? Procedure: CORONARY ARTERY BYPASS GRAFTING (CABG) X  4 USING LEFT INTERNAL MAMMARY ARTERY AND RIGHT SAPHENOUS VEIN GRAFTS;  Surgeon: LLajuana Matte MD;  [place]  Service: Open Heart Surgery;  Laterality: N/A;  ? ESOPHAGOGASTRODUODENOSCOPY (EGD) WITH PROPOFOL N/A 10/21/2020  ? rehman:Normal hypopharynx.Normal proximal esophagus and mid esophagus.Esophageal mucosal changes consistent with long-segment Barrett's esophagus. (Focal low-grade dysplasia and atypia proximally) z line irregular 37 cm from incisors, 3 cm HH, single gastric polyp (fundic gland) normal duodenal bulb/second portion of duodenum, proximal margin of Barrett's at 34 cm  ? ESOPHAGOGASTRODUODENOSCOPY (EGD) WITH PROPOFOL N/A 02/24/2021  ? Procedure: ESOPHAGOGASTRODUODENOSCOPY (EGD) WITH PROPOFOL;  Surgeon: RRogene Houston MD;  [facility];  Service: Endoscopy;  Laterality: N/A;  1:35  ? ESOPHAGOGASTRODUODENOSCOPY (EGD) WITH PROPOFOL N/A 03/21/2021  ? Procedure: ESOPHAGOGASTRODUODENOSCOPY (EGD) WITH PROPOFOL;  Surgeon: HCarol Ada  MD;  Location: [facility];  Service: Endoscopy;  Laterality: N/A;  ? EXCISION MORTON'S NEUROMA    ? EYE SURGERY Left   ? retina  ? INCISION AND DRAINAGE OF WOUND Right 06/28/2021  ? Procedure: IRRIGATION AND DEBRIDEMENT WOUND;  Surgeon: [name], MD;  Location: [place];  Service: Plastics;  Laterality: Right;  1.5 hour  ? POLYPECTOMY  06/27/2019  ?  Procedure: POLYPECTOMY;  Surgeon: Rogene Houston, MD;  [facility];  Service: Endoscopy;;  proximal sigmoid colon  ? RIGHT/LEFT HEART CATH AND CORONARY ANGIOGRAPHY N/A 03/12/2019  ? Procedure: RIGHT/LEFT HEART CATH AND CORONARY ANGIOGRAPHY;  Surgeon: [name], MD;  Location: [facility];  Service: Cardiovascular;  Laterality: N/A;  ? SHOULDER ARTHROSCOPY WITH ROTATOR CUFF REPAIR AND SUBACROMIAL DECOMPRESSION Right 08/26/2020  ? Procedure: RIGHT SHOULDER MINI OPEN ROTATOR CUFF REPAIR AND SUBACROMIAL DECOMPRESSION WITH PATCH GRAFT;  Surgeon: Susa Day, MD;  [facility]: [facility];  Service: Orthopedics;  Laterality: Right;  90 MINS ?GENERAL WITH BLOCK  ? SKIN SPLIT GRAFT Right 06/28/2021  ? Procedure: SKIN GRAFT SPLIT THICKNESS;  Surgeon: [name], MD;  Location: [place];  Service: Plastics;  Laterality: Right;  ? TEE WITHOUT CARDIOVERSION N/A 03/25/2019  ? Procedure: TRANSESOPHAGEAL ECHOCARDIOGRAM (TEE);  Surgeon: [name], MD;  Location: [place];  Service: Open Heart Surgery;  Laterality: N/A;  ?  ? ?Home Medications:  ?Prior to Admission medications   ?Medication Sig Start Date End Date Taking? Authorizing Provider  ?acetaminophen (TYLENOL) 325 MG tablet Take 2 tablets (650 mg total) by mouth every 6 (six) hours as needed for mild pain, moderate pain or fever. 08/02/21  Yes Allie Bossier, MD  ?albuterol (VENTOLIN HFA) 108 (90 Base) MCG/ACT inhaler Inhale 2 puffs into the lungs every 6 (six) hours as needed. ?Patient taking differently: Inhale 2 puffs into the lungs every 6 (six) hours as needed for wheezing or shortness of breath. 06/24/21  Yes Thersa Salt G, DO  ?ascorbic acid (VITAMIN C) 500 MG tablet Take 1 tablet (500 mg total) by mouth daily. 08/03/21  Yes Allie Bossier, MD  ?aspirin EC 81 MG EC tablet Take 4 tablets (324 mg total) by mouth daily. Swallow whole. 08/03/21  Yes Allie Bossier, MD  ?carvedilol (COREG) 12.5 MG tablet Take 1 tablet (12.5 mg total) by  mouth 2 (two) times daily. 02/05/21  Yes Loel Dubonnet, NP  ?doxazosin (CARDURA) 4 MG tablet Take 4 mg by mouth daily.   Yes [provider]  ?EPINEPHrine 0.3 mg/0.3 mL IJ SOAJ injection Inject 0.3 mg into the muscle as needed for anaphylaxis. 01/25/21  Yes [name], MD  ?hydrALAZINE (APRESOLINE) 50 MG tablet Take 1.5 tablets (75 mg total) by mouth 3 (three) times daily. 08/25/21 02/21/22 Yes Loel Dubonnet, NP  ?hydrOXYzine (VISTARIL) 25 MG capsule 1 nightly as needed allergies and insomnia ?Patient taking differently: 25 mg at bedtime as needed. allergies and insomnia 08/09/21  Yes [name], Scott A, MD  ?Insulin Pen Needle (B-D ULTRAFINE III SHORT PEN) 31G X 8 MM MISC 1 each by Does not apply route as directed. 05/30/19  Yes Nida, Marella Chimes, MD  ?metoCLOPramide (REGLAN) 10 MG tablet TAKE ONE TABLET ('10MG'$  TOTAL) BY MOUTH THREE TIMES DAILY BEFORE MEALS 08/09/21  Yes Carlan, Deatra Robinson, NP  ?Multiple Vitamin (MULTIVITAMIN WITH MINERALS) TABS tablet Take 1 tablet by mouth daily.   Yes [provider]  ?nitroGLYCERIN (NITROSTAT) 0.4 MG SL tablet  Place 1 tablet (0.4 mg total) under the tongue every 5 (five) minutes as needed for chest pain. 08/02/21  Yes Allie Bossier, MD  ?ondansetron (ZOFRAN-ODT) 4 MG disintegrating tablet Take 1 tablet (4 mg total) by mouth every 8 (eight) hours as needed for nausea or vomiting. 08/09/21  Yes Carlan, Chelsea L, NP  ?pantoprazole (PROTONIX) 40 MG tablet Take 1 tablet (40 mg total) by mouth 2 (two) times daily before a meal. 03/23/21  Yes Thurnell Lose, MD  ?REPATHA SURECLICK 076 MG/ML SOAJ Inject 140 mg as directed every 14 (fourteen) days. 08/25/21  Yes Loel Dubonnet, NP  ?Semaglutide (OZEMPIC, 1 MG/DOSE, [place]) Inject 1 mg into the skin once a week.   Yes [provider]  ?sertraline (ZOLOFT) 100 MG tablet TAKE 1 TABLET BY MOUTH DAILY 04/09/21  Yes [name], MD  ?torsemide (DEMADEX) 20 MG tablet Take 1 tablet (20 mg total) by mouth  every evening. 08/04/21  Yes Harriet Pho, Tessa N, PA-C  ?traMADol (ULTRAM) 50 MG tablet Take 25 mg by mouth every 6 (six) hours as needed for moderate pain.   Yes [provider]  ?VITAMIN D PO Take 1,000 Units

## 2021-08-31 ENCOUNTER — Ambulatory Visit: Payer: Medicare HMO

## 2021-08-31 LAB — GLUCOSE, CAPILLARY
Glucose-Capillary: 182 mg/dL — ABNORMAL HIGH (ref 70–99)
Glucose-Capillary: 167 mg/dL — ABNORMAL HIGH (ref 70–99)
Glucose-Capillary: 139 mg/dL — ABNORMAL HIGH (ref 70–99)
Glucose-Capillary: 134 mg/dL — ABNORMAL HIGH (ref 70–99)

## 2021-08-31 LAB — URINE CULTURE: Culture: NO GROWTH

## 2021-08-31 LAB — TROPONIN I (HIGH SENSITIVITY): Troponin I (High Sensitivity): 29 ng/L — ABNORMAL HIGH (ref <18)

## 2021-08-31 LAB — BASIC METABOLIC PANEL
Anion gap: 8 (ref 5–15)
BUN: 39 mg/dL — ABNORMAL HIGH (ref 8–23)
CO2: 22 mmol/L (ref 22–32)
Calcium: 8.2 mg/dL — ABNORMAL LOW (ref 8.9–10.3)
Chloride: 109 mmol/L (ref 98–111)
Creatinine, Ser: 3.75 mg/dL — ABNORMAL HIGH (ref 0.61–1.24)
GFR, Estimated: 17 mL/min — ABNORMAL LOW (ref >60)
Glucose, Bld: 190 mg/dL — ABNORMAL HIGH (ref 70–99)
Potassium: 3.9 mmol/L (ref 3.5–5.1)
Sodium: 139 mmol/L (ref 135–145)

## 2021-08-31 LAB — MAGNESIUM: Magnesium: 2.1 mg/dL (ref 1.7–2.4)

## 2021-08-31 LAB — PHOSPHORUS: Phosphorus: 4.3 mg/dL (ref 2.5–4.6)

## 2021-08-31 MED ORDER — METOPROLOL TARTRATE 5 MG/5ML IV SOLN
5.0000 mg | Freq: Once | INTRAVENOUS | Status: AC
  Administered 2021-08-31: 5 mg via INTRAVENOUS
  Filled 2021-08-31: qty 5

## 2021-08-31 NOTE — Plan of Care (Signed)
°  Problem: Education: °Goal: Ability to demonstrate management of disease process will improve °Outcome: Progressing °  °Problem: Activity: °Goal: Capacity to carry out activities will improve °Outcome: Progressing °  °Problem: Education: °Goal: Knowledge of General Education information will improve °Description: Including pain rating scale, medication(s)/side effects and non-pharmacologic comfort measures °Outcome: Progressing °  °Problem: Health Behavior/Discharge Planning: °Goal: Ability to manage health-related needs will improve °Outcome: Progressing °  °

## 2021-08-31 NOTE — Progress Notes (Signed)
? ?Progress Note ? ?Patient Name: [name] ?Date of Encounter: 08/31/2021 ? ?Primary Cardiologist: Skeet Latch, MD ? ?Subjective  ? ?Up in chair eating breakfast.  Feels better with diuresis.  States that he slept well last night. ? ?Inpatient Medications  ?  ?Scheduled Meds: ? aspirin EC  324 mg Oral Daily  ? carvedilol  12.5 mg Oral BID  ? furosemide  60 mg Intravenous BID  ? heparin  5,000 Units Subcutaneous Q8H  ? hydrALAZINE  100 mg Oral TID  ? insulin aspart  0-15 Units Subcutaneous TID WC  ? insulin aspart  0-5 Units Subcutaneous QHS  ? isosorbide dinitrate  30 mg Oral TID  ? ? ?PRN Meds: ?acetaminophen **OR** acetaminophen, albuterol, labetalol, ondansetron **OR** ondansetron (ZOFRAN) IV, oxyCODONE, polyethylene glycol  ? ?Vital Signs  ?  ?Vitals:  ? 08/30/21 2046 08/30/21 2050 08/31/21 0430 08/31/21 0500  ?BP: (!) 180/100 (!) 180/100 (!) 168/92   ?Pulse:  95 82   ?Resp:   18   ?Temp:  98.1 ?F (36.7 ?C) 97.7 ?F (36.5 ?C)   ?TempSrc:  Oral Oral   ?SpO2:   97%   ?Weight:    93.4 kg  ?Height:      ? ? ?Intake/Output Summary (Last 24 hours) at 08/31/2021 0920 ?Last data filed at 08/31/2021 0500 ?Gross per 24 hour  ?Intake 960 ml  ?Output 2300 ml  ?Net -1340 ml  ? ?Filed Weights  ? 08/29/21 2309 08/30/21 0619 08/31/21 0500  ?Weight: 95.9 kg 95.8 kg 93.4 kg  ? ? ?Telemetry  ?  ?Sinus rhythm.  Personally reviewed. ? ?ECG  ?  ?No ECG reviewed. ? ?Physical Exam  ? ?GEN: No acute distress.   ?Neck: No JVD. ?Cardiac: RRR, no murmur, rub, or gallop.  ?Respiratory: Nonlabored. Clear to auscultation bilaterally. ?GI: Soft, nontender, bowel sounds present. ?MS: Improving edema. ?Neuro:  Nonfocal. ?Psych: Alert and oriented x 3. Normal affect. ? ?Labs  ?  ?Chemistry ?Recent Labs  ?Lab 08/29/21 ?1806 08/30/21 ?3762 08/31/21 ?0606  ?NA 137 138 139  ?K 4.3 4.0 3.9  ?CL 108 108 109  ?CO2 19* 20* 22  ?GLUCOSE 204* 147* 190*  ?BUN 39* 38* 39*  ?CREATININE 3.79* 3.81* 3.75*  ?CALCIUM 8.5* 8.3* 8.2*  ?PROT 6.7  --   --    ?ALBUMIN 3.7  --   --   ?AST 13*  --   --   ?ALT 22  --   --   ?ALKPHOS 85  --   --   ?BILITOT 0.6  --   --   ?GFRNONAA 17* 17* 17*  ?ANIONGAP '10 10 8  '$ ?  ? ?Hematology ?Recent Labs  ?Lab 08/29/21 ?1806  ?WBC 7.3  ?RBC 3.91*  ?HGB 10.3*  ?HCT 33.7*  ?MCV 86.2  ?MCH 26.3  ?MCHC 30.6  ?RDW 17.4*  ?PLT 240  ? ? ?Cardiac Enzymes ?Recent Labs  ?Lab 08/29/21 ?1806 08/29/21 ?1942  ?TROPONINIHS 36* 35*  ? ? ?BNP ?Recent Labs  ?Lab 08/29/21 ?1806  ?BNP 3,471.0*  ?  ? ?Radiology  ?  ?DG Chest Port 1 View ? ?Result Date: 08/29/2021 ?CLINICAL DATA:  Fall. EXAM: PORTABLE CHEST 1 VIEW COMPARISON:  Chest x-ray dated August 09, 2021. FINDINGS: Unchanged mild cardiomegaly status post CABG. Chronic mild pulmonary vascular congestion. Resolved right lower lobe opacity. No focal consolidation, pleural effusion, or pneumothorax. No acute osseous abnormality. IMPRESSION: 1. No active disease. Resolved right lower lobe airspace disease. Electronically Signed   By: Gwyndolyn Saxon  Marzella Schlein M.D.   On: 08/29/2021 18:36   ? ?Cardiac Studies  ? ?Echocardiogram 07/25/2021: ? 1. Left ventricular ejection fraction, by estimation, is 40 to 45%. The  ?left ventricle has mildly decreased function. The left ventricle  ?demonstrates regional wall motion abnormalities (see scoring  ?diagram/findings for description). There is mild  ?concentric left ventricular hypertrophy. Left ventricular diastolic  ?function could not be evaluated. There is akinesis of the left  ?ventricular, basal inferior wall and inferoseptal wall.  ? 2. Right ventricular systolic function is moderately reduced. The right  ?ventricular size is severely enlarged. There is moderately elevated  ?pulmonary artery systolic pressure. The estimated right ventricular  ?systolic pressure is 00.9 mmHg.  ? 3. Left atrial size was moderately dilated.  ? 4. The mitral valve is degenerative. Mild to moderate mitral valve  ?regurgitation. No evidence of mitral stenosis.  ? 5. The aortic valve is normal  in structure. Aortic valve regurgitation is  ?not visualized. No aortic stenosis is present.  ? 6. Aortic dilatation noted. There is mild dilatation of the aortic root,  ?measuring 39 mm.  ? 7. The inferior vena cava is dilated in size with <50% respiratory  ?variability, suggesting right atrial pressure of 15 mmHg.  ? ?Assessment & Plan  ?  ?1.  HFrEF with acute on chronic heart failure and fluid overload complicated by sodium intake as an outpatient.  States that he gained approximately 8 pounds over baseline.  Currently diuresing well on IV Lasix.  Approximately 3500 cc net output over the last 48 hours. ? ?2.  Multivessel CAD status post CABG with plan for continued medical therapy in the absence of progressive angina symptoms and previous ischemic assessment in September 2022 with Myoview showing infarct scar and mild peri-infarct ischemia. ? ?3.  CKD stage IV, creatinine 3.75 is relatively stable. ? ?4.  Essential hypertension, difficult to control in light of multiple medication intolerances.  Hydralazine was uptitrated yesterday. ? ?Continue Lasix 60 mg IV twice daily, follow-up BMET and urine output tomorrow.  Otherwise on stable dose of Coreg, hydralazine, and Isordil.  Possibly discharge home in the next 24 hours. ? ?Signed, ?Rozann Lesches, MD  ?08/31/2021, 9:20 AM    ?

## 2021-08-31 NOTE — Progress Notes (Signed)
?PROGRESS NOTE ? ? ?[name]  SMO:[id number] DOB: Jan 12, 1956 DOA: 08/29/2021 ?PCP: [name], MD  ? ?Chief Complaint  ?Patient presents with  ? Weakness  ? Fall  ? ?Level of care: Telemetry ? ?Brief Admission History:  ?66 y.o. male with medical history significant for systolic and diastolic CHF, CKD4, diabetes mellitus, hypertension, CABG 2010, GI bleed, peripheral artery disease, esophageal cancer. ?Patient presented to the ED with complaints of a fall, generalized weakness, and swelling of his abdomen, hands and feet.  Reports weakness has been ongoing for the past several months.  He reports he has been having some swelling since he was discharged from the hospital 08/02/2021, but over the past 2 days, the swelling has significantly worsened.  Reports compliance with his torsemide.  As much as he can he tries to adhere to a low-salt diet.  Reports some difficulty breathing.  No chest pain. ?Reports 3 episodes of vomiting today, he has chronic nausea.  No abdominal pain, but reports abdominal swelling from fluid. ?Patient reports he was standing and fell because he felt dizzy.  He has chronic dizziness.  He uses a cane for ambulation.  [name] at bedside reports that patient has not had heating or hot water at his place for days.  In the ED, patient's weight is 213 pounds. ?  ?Recent hospitalization 2/11 to 2/20 for decompensated CHF, discharge weight was 198 pounds.  Medications were also adjusted, torsemide 40 mg daily/20 mg in the evening. ?  ?ED Course: Blood pressure elevated to 195/114.  Heart rate 78-88.  Blood pressure systolic 92-01.  O2 sats greater than 94% on room air. ?BNP elevated at 3471.  Creatinine at baseline 3.79.  Troponin 36.  Chest x-ray without acute abnormality.  IV Lasix 80 mg x 1 given in ED.  Hospitalist to admit for decompensated CHF. ? ?08/30/2021:  pt has diuresed nearly 2.5L since admission and starting to feel better. He is not back to his baseline and his weight is  up.  Cardiology consulted and recommendations to continue IV lasix.   Creatinine holding stable.  Repeat labs in AM.  ? ?08/31/2021: renal function stable, continues to diurese well, sitting up in chair feeling much better, cardiology recommending 1 more day of IV lasix diuresis and possible DC home 3/22.   ?  ?Assessment and Plan: ?* Acute on chronic combined systolic and diastolic CHF (congestive heart failure) ([place]) ?2+ pitting lower extremity edema, abdominal swelling, BNP elevated at 3471.  Weight gain since recent hospitalization of ~ 15lbs.  Chest x-ray clear.  Reports compliance with torsemide.  Recent echo 07/2021, EF of 40 to 45%. ?-continue 60 mg IV lasix twice daily ?-Monitor renal function closely ?-Cardiology consult ?-Strict input output, daily weights, daily BNP ? ?HFrEF (heart failure with reduced ejection fraction) ([place]) ?He is diuresing well on IV lasix.  Pt requested to speak with dietitian about diet education.    ? ?Intake/Output Summary (Last 24 hours) at 08/31/2021 1248 ?Last data filed at 08/31/2021 1225 ?Gross per 24 hour  ?Intake 1072 ml  ?Output 2350 ml  ?Net -1278 ml  ? ? ? ?Obstructive sleep apnea ?CPAP nightly ordered.  ? ?CKD (chronic kidney disease) stage 4, GFR 15-29 ml/min (HCC) ?Creatinine 3.79.  At baseline. ?-Monitor closely with diuresis. ?-Pt followed outpatient by Dr. Clover Mealy ?-phos within normal limits ? ?DM type 2 causing vascular disease ([place]) ?Controlled, A1c 6.4. ?- SSI- M ?-Hold home semaglutide ?CBG (last 3)  ?Recent Labs  ?  08/30/21 ?2049 08/31/21 ?0727 08/31/21 ?1112  ?GLUCAP 156* 182* 167*  ? ? ? ?S/P CABG (coronary artery bypass graft) ?No chest pain.  EKG similar to prior.  Troponin 36 > 35. ?ACS ruled out.  ? ?Essential hypertension, benign ?Suboptimally controlled.   Recent medication adjustment with hydralazine increased to 100 mg TID. ?-Resume carvedilol 12.5 twice daily, Imdur 30 3 times daily, hydralazine, ?-As needed IV labetalol 10 mg for systolic greater  than 417. ?-continue IV lasix for diuresis.  ? ?DVT prophylaxis: [place] heparin ?Code Status: Full  ?Family Communication:  ?Disposition: Status is: Inpatient ?Remains inpatient appropriate because: IV lasix for diuresis required ?  ?Consultants:  ?[facility] ?Procedures:  ? ?Antimicrobials:  ?  ?Subjective: ?Pt says that he has much less SOB since diuresing.  He would like to speak with dietitian about diet.   ?Objective: ?Vitals:  ? 08/30/21 2050 08/31/21 0430 08/31/21 0500 08/31/21 1224  ?BP: (!) 180/100 (!) 168/92  (!) 174/106  ?Pulse: 95 82  80  ?Resp:  18  19  ?Temp: 98.1 ?F (36.7 ?C) 97.7 ?F (36.5 ?C)  97.9 ?F (36.6 ?C)  ?TempSrc: Oral Oral  Oral  ?SpO2:  97%  97%  ?Weight:   93.4 kg   ?Height:      ? ? ?Intake/Output Summary (Last 24 hours) at 08/31/2021 1253 ?Last data filed at 08/31/2021 1225 ?Gross per 24 hour  ?Intake 1072 ml  ?Output 2350 ml  ?Net -1278 ml  ? ?Filed Weights  ? 08/29/21 2309 08/30/21 0619 08/31/21 0500  ?Weight: 95.9 kg 95.8 kg 93.4 kg  ? ?Examination: ? ?General exam: awake, alert, cooperative, appears volume overloaded, pleasant, NAD. Appears calm and comfortable  ?Respiratory system: rare rales at bases. Respiratory effort normal. ?Cardiovascular system: normal S1 & S2 heard. No JVD, murmurs, rubs, gallops or clicks. trace pedal edema. ?Gastrointestinal system: Abdomen is nondistended, soft and nontender. No organomegaly or masses felt. Normal bowel sounds heard. ?Central nervous system: Alert and oriented. No focal neurological deficits. ?Extremities: trace bilateral pretibial edema. Symmetric 5 x 5 power. ?Skin: No rashes, lesions or ulcers. ?Psychiatry: Judgement and insight appear normal. Mood & affect appropriate.  ? ?Data Reviewed: I have personally reviewed following labs and imaging studies ? ?CBC: ?Recent Labs  ?Lab 08/29/21 ?1806  ?WBC 7.3  ?NEUTROABS 5.4  ?HGB 10.3*  ?HCT 33.7*  ?MCV 86.2  ?PLT 240  ? ? ?Basic Metabolic Panel: ?Recent Labs  ?Lab 08/29/21 ?1806 08/30/21 ?4081  08/31/21 ?0606  ?NA 137 138 139  ?K 4.3 4.0 3.9  ?CL 108 108 109  ?CO2 19* 20* 22  ?GLUCOSE 204* 147* 190*  ?BUN 39* 38* 39*  ?CREATININE 3.79* 3.81* 3.75*  ?CALCIUM 8.5* 8.3* 8.2*  ?MG 2.3  --  2.1  ?PHOS  --   --  4.3  ? ? ?CBG: ?Recent Labs  ?Lab 08/30/21 ?1112 08/30/21 ?1723 08/30/21 ?2049 08/31/21 ?0727 08/31/21 ?1112  ?GLUCAP 148* 170* 156* 182* 167*  ? ? ?Recent Results (from the past 240 hour(s))  ?Urine Culture     Status: None  ? Collection Time: 08/29/21  6:09 PM  ? Specimen: Urine, Clean Catch  ?Result Value Ref Range Status  ? Specimen Description   Final  ?  URINE, CLEAN CATCH ?Performed at [facility], [address] [place], [place] [address] ?  ? Special Requests   Final  ?  NONE ?Performed at [facility], [address] [place],  [address] ?  ? Culture   Final  ?  NO GROWTH ?Performed at [facility], [address] [place], [place] [address] ?  ? Report Status 08/31/2021 FINAL  Final  ?Resp Panel by RT-PCR (Flu A&B, Covid) Nasopharyngeal Swab     Status: None  ? Collection Time: 08/29/21  6:10 PM  ? Specimen: Nasopharyngeal Swab; Nasopharyngeal(NP) swabs in vial transport medium  ?Result Value Ref Range Status  ? SARS Coronavirus 2 by RT PCR NEGATIVE NEGATIVE Final  ?  Comment: (NOTE) ?SARS-CoV-2 target nucleic acids are NOT DETECTED. ? ?The SARS-CoV-2 RNA is generally detectable in upper respiratory ?specimens during the acute phase of infection. The lowest ?concentration of SARS-CoV-2 viral copies this assay can detect is ?138 copies/mL. A negative result does not preclude SARS-Cov-2 ?infection and should not be used as the sole basis for treatment or ?other patient management decisions. A negative result may occur with  ?improper specimen collection/handling, submission of specimen other ?than nasopharyngeal swab, presence of viral mutation(s) within the ?areas targeted by this assay, and inadequate number of viral ?copies(<138 copies/mL). A negative result must be combined  with ?clinical observations, patient history, and epidemiological ?information. The expected result is Negative. ? ?Fact Sheet for Patients:  ?EntrepreneurPulse.com.au ? ?Fact Sheet for Health

## 2021-08-31 NOTE — Progress Notes (Signed)
Initial Nutrition Assessment ? ?DOCUMENTATION CODES:  ? ?  ? ?INTERVENTION:  ?Education focus-Heart Healthy diet ? ? ?NUTRITION DIAGNOSIS:  ? ?Food and nutrition related knowledge deficit related to limited prior education as evidenced by per patient/family report. ? ? ?GOAL:  ? (Patient will limit daily sodium intake < 2 gr daily) ? ? ?MONITOR:  ?I & O's, Weight trends, PO intake ? ? ?REASON FOR ASSESSMENT:  ? ?Consult ?Diet education ? ?ASSESSMENT: Patgient is a 66 yo male with hx of CHF, DM2, HTN, CKD-IV, CAD,MI, esophageal cancer and stroke. ? ?Presents with acute on chronic heart failure. Diuresis- IV lasix 60 mg BID.   ? ?Desirable weight decrease 3 kg since admission.  ? ?Patient appetite is good- 100% of breakfast and lunch consumed. Patient loves to cook and attended [employer] school. He is planning soups that are homemade and lower sodium - seasoned with Ms Deliah Boston herbal seasoning which he likes. We also talked about other high sodium foods that he likes such as sausage and cheese. Discussed alternate choices and emphasized portion control/moderation.  ? ?Reviewed Heart Healthy / CHO modified diet and provided handouts.  ? ? ?Intake/Output Summary (Last 24 hours) at 08/31/2021 1358 ?Last data filed at 08/31/2021 1225 ?Gross per 24 hour  ?Intake 592 ml  ?Output 2350 ml  ?Net -1758 ml  ?  ?Labs: ?BMP Latest Ref Rng & Units 08/31/2021 08/30/2021 08/29/2021  ?Glucose 70 - 99 mg/dL 190(H) 147(H) 204(H)  ?BUN 8 - 23 mg/dL 39(H) 38(H) 39(H)  ?Creatinine 0.61 - 1.24 mg/dL 3.75(H) 3.81(H) 3.79(H)  ?BUN/Creat Ratio 10 - 24 - - -  ?Sodium 135 - 145 mmol/L 139 138 137  ?Potassium 3.5 - 5.1 mmol/L 3.9 4.0 4.3  ?Chloride 98 - 111 mmol/L 109 108 108  ?CO2 22 - 32 mmol/L 22 20(L) 19(L)  ?Calcium 8.9 - 10.3 mg/dL 8.2(L) 8.3(L) 8.5(L)  ? ?Diet Order:   ?Diet Order   ? ?       ?  Diet heart healthy/carb modified Room service appropriate? Yes; Fluid consistency: Thin  Diet effective now       ?  ? ?  ?  ? ?  ? ? ?EDUCATION NEEDS:   ?Education needs have been addressed ? ?Skin:  Skin Assessment: Reviewed RN Assessment ? ?Last BM:  3/20 ? ?Height:  ? ?Ht Readings from Last 1 Encounters:  ?08/29/21 '5\' 10"'$  (1.778 m)  ? ? ?Weight:  ? ?Wt Readings from Last 1 Encounters:  ?08/31/21 93.4 kg  ? ? ?Ideal Body Weight:   68 kg ? ?BMI:  Body mass index is 29.56 kg/m?. ? ?Estimated Nutritional Needs:  ? ?Kcal:  2100-2300 ? ?Protein:  60-65 gr ? ?Fluid:  < 2 liters daily ? ?Colman Cater MS,RD,CSG,LDN ?Contact: AMION ? ?

## 2021-09-01 ENCOUNTER — Other Ambulatory Visit: Payer: Self-pay

## 2021-09-01 ENCOUNTER — Emergency Department (HOSPITAL_COMMUNITY): Payer: Medicare HMO

## 2021-09-01 ENCOUNTER — Encounter (HOSPITAL_COMMUNITY): Payer: Self-pay | Admitting: Emergency Medicine

## 2021-09-01 ENCOUNTER — Emergency Department (HOSPITAL_COMMUNITY)
Admission: EM | Admit: 2021-09-01 | Discharge: 2021-09-02 | Disposition: A | Discharge status: Home / Self Care | Payer: Medicare HMO | Attending: Emergency Medicine | Admitting: Emergency Medicine

## 2021-09-01 ENCOUNTER — Ambulatory Visit: Payer: Medicare HMO | Admitting: Plastic Surgery

## 2021-09-01 DIAGNOSIS — R45851 Suicidal ideations: Secondary | ICD-10-CM | POA: Insufficient documentation

## 2021-09-01 DIAGNOSIS — J90 Pleural effusion, not elsewhere classified: Secondary | ICD-10-CM | POA: Diagnosis not present

## 2021-09-01 DIAGNOSIS — R079 Chest pain, unspecified: Secondary | ICD-10-CM | POA: Diagnosis not present

## 2021-09-01 DIAGNOSIS — I5023 Acute on chronic systolic (congestive) heart failure: Secondary | ICD-10-CM | POA: Diagnosis not present

## 2021-09-01 DIAGNOSIS — I517 Cardiomegaly: Secondary | ICD-10-CM | POA: Diagnosis not present

## 2021-09-01 DIAGNOSIS — G4733 Obstructive sleep apnea (adult) (pediatric): Secondary | ICD-10-CM | POA: Diagnosis not present

## 2021-09-01 DIAGNOSIS — F32A Depression, unspecified: Secondary | ICD-10-CM | POA: Insufficient documentation

## 2021-09-01 DIAGNOSIS — Z794 Long term (current) use of insulin: Secondary | ICD-10-CM | POA: Diagnosis not present

## 2021-09-01 DIAGNOSIS — Z7982 Long term (current) use of aspirin: Secondary | ICD-10-CM | POA: Diagnosis not present

## 2021-09-01 DIAGNOSIS — N189 Chronic kidney disease, unspecified: Secondary | ICD-10-CM | POA: Diagnosis not present

## 2021-09-01 DIAGNOSIS — N184 Chronic kidney disease, stage 4 (severe): Secondary | ICD-10-CM | POA: Diagnosis not present

## 2021-09-01 DIAGNOSIS — I509 Heart failure, unspecified: Secondary | ICD-10-CM | POA: Diagnosis not present

## 2021-09-01 DIAGNOSIS — I10 Essential (primary) hypertension: Secondary | ICD-10-CM | POA: Diagnosis not present

## 2021-09-01 DIAGNOSIS — I5043 Acute on chronic combined systolic (congestive) and diastolic (congestive) heart failure: Secondary | ICD-10-CM

## 2021-09-01 DIAGNOSIS — I5042 Chronic combined systolic (congestive) and diastolic (congestive) heart failure: Secondary | ICD-10-CM

## 2021-09-01 LAB — BASIC METABOLIC PANEL
Anion gap: 10 (ref 5–15)
BUN: 42 mg/dL — ABNORMAL HIGH (ref 8–23)
CO2: 20 mmol/L — ABNORMAL LOW (ref 22–32)
Calcium: 8.3 mg/dL — ABNORMAL LOW (ref 8.9–10.3)
Chloride: 107 mmol/L (ref 98–111)
Creatinine, Ser: 3.75 mg/dL — ABNORMAL HIGH (ref 0.61–1.24)
GFR, Estimated: 17 mL/min — ABNORMAL LOW (ref >60)
Glucose, Bld: 120 mg/dL — ABNORMAL HIGH (ref 70–99)
Potassium: 4 mmol/L (ref 3.5–5.1)
Sodium: 137 mmol/L (ref 135–145)

## 2021-09-01 LAB — COMPREHENSIVE METABOLIC PANEL
ALT: 13 U/L (ref 0–44)
AST: 11 U/L — ABNORMAL LOW (ref 15–41)
Albumin: 3.5 g/dL (ref 3.5–5.0)
Alkaline Phosphatase: 75 U/L (ref 38–126)
Anion gap: 11 (ref 5–15)
BUN: 48 mg/dL — ABNORMAL HIGH (ref 8–23)
CO2: 22 mmol/L (ref 22–32)
Calcium: 8.3 mg/dL — ABNORMAL LOW (ref 8.9–10.3)
Chloride: 103 mmol/L (ref 98–111)
Creatinine, Ser: 3.72 mg/dL — ABNORMAL HIGH (ref 0.61–1.24)
GFR, Estimated: 17 mL/min — ABNORMAL LOW (ref >60)
Glucose, Bld: 221 mg/dL — ABNORMAL HIGH (ref 70–99)
Potassium: 4.5 mmol/L (ref 3.5–5.1)
Sodium: 136 mmol/L (ref 135–145)
Total Bilirubin: 0.3 mg/dL (ref 0.3–1.2)
Total Protein: 6.4 g/dL — ABNORMAL LOW (ref 6.5–8.1)

## 2021-09-01 LAB — GLUCOSE, CAPILLARY
Glucose-Capillary: 118 mg/dL — ABNORMAL HIGH (ref 70–99)
Glucose-Capillary: 241 mg/dL — ABNORMAL HIGH (ref 70–99)

## 2021-09-01 LAB — CBC
HCT: 32.9 % — ABNORMAL LOW (ref 39.0–52.0)
Hemoglobin: 9.9 g/dL — ABNORMAL LOW (ref 13.0–17.0)
MCH: 26 pg (ref 26.0–34.0)
MCHC: 30.1 g/dL (ref 30.0–36.0)
MCV: 86.4 fL (ref 80.0–100.0)
Platelets: 279 10*3/uL (ref 150–400)
RBC: 3.81 MIL/uL — ABNORMAL LOW (ref 4.22–5.81)
RDW: 17.3 % — ABNORMAL HIGH (ref 11.5–15.5)
WBC: 6.6 10*3/uL (ref 4.0–10.5)
nRBC: 0 % (ref 0.0–0.2)

## 2021-09-01 LAB — TROPONIN I (HIGH SENSITIVITY): Troponin I (High Sensitivity): 27 ng/L — ABNORMAL HIGH (ref <18)

## 2021-09-01 MED ORDER — ISOSORBIDE DINITRATE 30 MG PO TABS: 30.0000 mg | ORAL_TABLET | Freq: Three times a day (TID) | ORAL | 1 refills | Status: DC

## 2021-09-01 MED ORDER — TORSEMIDE 40 MG PO TABS: 40.0000 mg | ORAL_TABLET | Freq: Every day | ORAL | 1 refills | Status: DC

## 2021-09-01 MED ORDER — HYDRALAZINE HCL 100 MG PO TABS: 100.0000 mg | ORAL_TABLET | Freq: Three times a day (TID) | ORAL | 1 refills | Status: DC

## 2021-09-01 NOTE — Progress Notes (Signed)
? ?Progress Note ? ?Patient Name: [name] ?Date of Encounter: 09/01/2021 ? ?Primary Cardiologist: Skeet Latch, MD ? ?Subjective  ? ?Feels better in terms of fluid status and shortness of breath.  No chest pain. ? ?Inpatient Medications  ?  ?Scheduled Meds: ? aspirin EC  324 mg Oral Daily  ? carvedilol  12.5 mg Oral BID  ? furosemide  60 mg Intravenous BID  ? heparin  5,000 Units Subcutaneous Q8H  ? hydrALAZINE  100 mg Oral TID  ? insulin aspart  0-15 Units Subcutaneous TID WC  ? insulin aspart  0-5 Units Subcutaneous QHS  ? isosorbide dinitrate  30 mg Oral TID  ? ? ?PRN Meds: ?acetaminophen **OR** acetaminophen, albuterol, labetalol, ondansetron **OR** ondansetron (ZOFRAN) IV, oxyCODONE, polyethylene glycol  ? ?Vital Signs  ?  ?Vitals:  ? 08/31/21 2142 08/31/21 2246 08/31/21 2332 09/01/21 0500  ?BP: (!) 175/105  (!) 146/79 (!) 165/105  ?Pulse: 84 82 81 80  ?Resp: '20 18  20  '$ ?Temp: 98.4 ?F (36.9 ?C)     ?TempSrc: Oral     ?SpO2: 100% 97% 97% 99%  ?Weight:    92.2 kg  ?Height:      ? ? ?Intake/Output Summary (Last 24 hours) at 09/01/2021 1004 ?Last data filed at 09/01/2021 0500 ?Gross per 24 hour  ?Intake 840 ml  ?Output 2150 ml  ?Net -1310 ml  ? ?Filed Weights  ? 08/30/21 0619 08/31/21 0500 09/01/21 0500  ?Weight: 95.8 kg 93.4 kg 92.2 kg  ? ? ?Telemetry  ?  ?Sinus rhythm with occasional PVCs.  Personally reviewed. ? ?ECG  ?  ?No ECG reviewed. ? ?Physical Exam  ? ?GEN: No acute distress.   ?Neck: No JVD. ?Cardiac: RRR, no or gallop.  ?Respiratory: Nonlabored. Clear to auscultation bilaterally. ?GI: Soft, nontender, bowel sounds present. ?MS: No pitting edema. ?Neuro:  Nonfocal. ?Psych: Alert and oriented x 3. Normal affect. ? ?Labs  ?  ?Chemistry ?Recent Labs  ?Lab 08/29/21 ?1806 08/30/21 ?6812 08/31/21 ?0606 09/01/21 ?0522  ?NA 137 138 139 137  ?K 4.3 4.0 3.9 4.0  ?CL 108 108 109 107  ?CO2 19* 20* 22 20*  ?GLUCOSE 204* 147* 190* 120*  ?BUN 39* 38* 39* 42*  ?CREATININE 3.79* 3.81* 3.75* 3.75*  ?CALCIUM  8.5* 8.3* 8.2* 8.3*  ?PROT 6.7  --   --   --   ?ALBUMIN 3.7  --   --   --   ?AST 13*  --   --   --   ?ALT 22  --   --   --   ?ALKPHOS 85  --   --   --   ?BILITOT 0.6  --   --   --   ?GFRNONAA 17* 17* 17* 17*  ?ANIONGAP '10 10 8 10  '$ ?  ? ?Hematology ?Recent Labs  ?Lab 08/29/21 ?1806  ?WBC 7.3  ?RBC 3.91*  ?HGB 10.3*  ?HCT 33.7*  ?MCV 86.2  ?MCH 26.3  ?MCHC 30.6  ?RDW 17.4*  ?PLT 240  ? ? ?Cardiac Enzymes ?Recent Labs  ?Lab 08/29/21 ?1806 08/29/21 ?1942 08/31/21 ?2204  ?TROPONINIHS 36* 35* 29*  ? ? ?BNP ?Recent Labs  ?Lab 08/29/21 ?1806  ?BNP 3,471.0*  ?  ? ?Radiology  ?  ?No results found. ? ?Cardiac Studies  ? ?Echocardiogram 07/25/2021: ? 1. Left ventricular ejection fraction, by estimation, is 40 to 45%. The  ?left ventricle has mildly decreased function. The left ventricle  ?demonstrates regional wall motion abnormalities (see scoring  ?  diagram/findings for description). There is mild  ?concentric left ventricular hypertrophy. Left ventricular diastolic  ?function could not be evaluated. There is akinesis of the left  ?ventricular, basal inferior wall and inferoseptal wall.  ? 2. Right ventricular systolic function is moderately reduced. The right  ?ventricular size is severely enlarged. There is moderately elevated  ?pulmonary artery systolic pressure. The estimated right ventricular  ?systolic pressure is 58.8 mmHg.  ? 3. Left atrial size was moderately dilated.  ? 4. The mitral valve is degenerative. Mild to moderate mitral valve  ?regurgitation. No evidence of mitral stenosis.  ? 5. The aortic valve is normal in structure. Aortic valve regurgitation is  ?not visualized. No aortic stenosis is present.  ? 6. Aortic dilatation noted. There is mild dilatation of the aortic root,  ?measuring 39 mm.  ? 7. The inferior vena cava is dilated in size with <50% respiratory  ?variability, suggesting right atrial pressure of 15 mmHg.  ? ?Assessment & Plan  ?  ?1.  HFrEF with acute on chronic heart failure and fluid overload  complicated by sodium intake as an outpatient.  Weight is down 10 pounds with diuresis, had additional net output of approximately 800 cc last 24 hours with IV Lasix. ? ?2.  Multivessel CAD status post CABG with plan for continued medical therapy in the absence of progressive angina symptoms and previous ischemic assessment in September 2022 with Myoview showing infarct scar and mild peri-infarct ischemia. ? ?3.  CKD stage IV, creatinine 3.75 is stable. ? ?4.  Essential hypertension, difficult to control in light of multiple medication intolerances.  Hydralazine was uptitrated.  He follows in the hypertension clinic. ? ?Anticipate discharge home today.  Would suggest increasing outpatient Demadex to 40 mg daily, otherwise continue present cardiac regimen.  He will keep follow-up in the hypertension clinic. ? ?Signed, ?Rozann Lesches, MD  ?09/01/2021, 10:04 AM    ?

## 2021-09-01 NOTE — Care Management Important Message (Signed)
Important Message ? ?Patient Details  ?Name: [name] ?MRN: [id number] ?Date of Birth: Sep 17, 1955 ? ? ?Medicare Important Message Given:  Yes ? ? ? ? ?Tommy Medal ?09/01/2021, 11:41 AM ?

## 2021-09-01 NOTE — ED Provider Notes (Signed)
? ?[facility]  ?Provider Note ? ?CSN: [id number] ?Arrival date & time: 09/01/21 2216 ? ?History ?Chief Complaint  ?Patient presents with  ? Depression  ? ? ?[name] is a 66 y.o. male with history of multiple medical problems including CKD and CHF was discharged from the hospital earlier today after an admission for acute CHF. He is back this evening voluntarily for evaluation of depression due to his chronic medical issues and lack of support as an outpatient. He reports depressed mood, despondency and suicidal ideation. ? ? ?Home Medications ?Prior to Admission medications   ?Medication Sig Start Date End Date Taking? Authorizing Provider  ?acetaminophen (TYLENOL) 325 MG tablet Take 2 tablets (650 mg total) by mouth every 6 (six) hours as needed for mild pain, moderate pain or fever. 08/02/21   Allie Bossier, MD  ?albuterol (VENTOLIN HFA) 108 (90 Base) MCG/ACT inhaler Inhale 2 puffs into the lungs every 6 (six) hours as needed. ?Patient taking differently: Inhale 2 puffs into the lungs every 6 (six) hours as needed for wheezing or shortness of breath. 06/24/21   Coral Spikes, DO  ?ascorbic acid (VITAMIN C) 500 MG tablet Take 1 tablet (500 mg total) by mouth daily. 08/03/21   Allie Bossier, MD  ?aspirin EC 81 MG EC tablet Take 4 tablets (324 mg total) by mouth daily. Swallow whole. 08/03/21   Allie Bossier, MD  ?carvedilol (COREG) 12.5 MG tablet Take 1 tablet (12.5 mg total) by mouth 2 (two) times daily. 08/30/21   Loel Dubonnet, NP  ?doxazosin (CARDURA) 4 MG tablet Take 4 mg by mouth daily.    [provider]  ?EPINEPHrine 0.3 mg/0.3 mL IJ SOAJ injection Inject 0.3 mg into the muscle as needed for anaphylaxis. 01/25/21   [name], MD  ?hydrALAZINE (APRESOLINE) 100 MG tablet Take 1 tablet (100 mg total) by mouth 3 (three) times daily. 09/01/21   Orson Eva, MD  ?hydrOXYzine (VISTARIL) 25 MG capsule 1 nightly as needed allergies and insomnia ?Patient taking  differently: 25 mg at bedtime as needed. allergies and insomnia 08/09/21   [name], MD  ?Insulin Pen Needle (B-D ULTRAFINE III SHORT PEN) 31G X 8 MM MISC 1 each by Does not apply route as directed. 05/30/19   Cassandria Anger, MD  ?isosorbide dinitrate (ISORDIL) 30 MG tablet Take 1 tablet (30 mg total) by mouth 3 (three) times daily. 09/01/21   Orson Eva, MD  ?metoCLOPramide (REGLAN) 10 MG tablet TAKE ONE TABLET ('10MG'$  TOTAL) BY MOUTH THREE TIMES DAILY BEFORE MEALS 08/09/21   Gabriel Rung, NP  ?Multiple Vitamin (MULTIVITAMIN WITH MINERALS) TABS tablet Take 1 tablet by mouth daily.    [provider]  ?nitroGLYCERIN (NITROSTAT) 0.4 MG SL tablet Place 1 tablet (0.4 mg total) under the tongue every 5 (five) minutes as needed for chest pain. 08/02/21   Allie Bossier, MD  ?ondansetron (ZOFRAN-ODT) 4 MG disintegrating tablet Take 1 tablet (4 mg total) by mouth every 8 (eight) hours as needed for nausea or vomiting. 08/09/21   Carlan, Chelsea L, NP  ?pantoprazole (PROTONIX) 40 MG tablet Take 1 tablet (40 mg total) by mouth 2 (two) times daily before a meal. 03/23/21   Thurnell Lose, MD  ?REPATHA SURECLICK 081 MG/ML SOAJ Inject 140 mg as directed every 14 (fourteen) days. 08/25/21   Loel Dubonnet, NP  ?Semaglutide (OZEMPIC, 1 MG/DOSE, [place]) Inject 1 mg into the skin once a week.  [provider]  ?sertraline (ZOLOFT) 100 MG tablet TAKE 1 TABLET BY MOUTH DAILY 04/09/21   [name], MD  ?Torsemide 40 MG TABS Take 40 mg by mouth daily. 09/01/21   Orson Eva, MD  ?traMADol (ULTRAM) 50 MG tablet Take 25 mg by mouth every 6 (six) hours as needed for moderate pain.    [provider]  ?VITAMIN D PO Take 1,000 Units by mouth daily.    [provider]  ? ? ? ?Allergies    ?Bee venom, Atorvastatin, Diltiazem, Rosuvastatin, and Valsartan ? ? ?Review of Systems   ?Review of Systems ?Please see HPI for pertinent positives and negatives ? ?Physical Exam ?BP (!) 178/107 (BP  Location: Right Arm)   Pulse 82   Temp 97.6 ?F (36.4 ?C) (Oral)   Resp (!) 22   Ht '5\' 10"'$  (1.778 m)   Wt 90.7 kg   SpO2 98%   BMI 28.70 kg/m?  ? ?Physical Exam ?Vitals and nursing note reviewed.  ?HENT:  ?   Head: Normocephalic.  ?   Nose: Nose normal.  ?Eyes:  ?   Extraocular Movements: Extraocular movements intact.  ?Pulmonary:  ?   Effort: Pulmonary effort is normal. No respiratory distress.  ?Musculoskeletal:     ?   General: Normal range of motion.  ?   Cervical back: Neck supple.  ?Skin: ?   Findings: No rash (on exposed skin).  ?Neurological:  ?   Mental Status: He is alert and oriented to person, place, and time.  ?Psychiatric:  ?   Comments: Flat affect  ? ? ?ED Results / Procedures / Treatments   ?EKG ?EKG Interpretation ? ?Date/Time:  Wednesday September 01 2021 22:26:30 EDT ?Ventricular Rate:  80 ?PR Interval:  178 ?QRS Duration: 106 ?QT Interval:  404 ?QTC Calculation: 465 ?R Axis:   116 ?Text Interpretation: Sinus rhythm with occasional Premature ventricular complexes and Premature atrial complexes Left posterior fascicular block Cannot rule out Inferior infarct Since last tracing PVCs are now present Confirmed by Calvert Cantor [phone]) on 09/01/2021 11:14:29 PM ? ?Procedures ?Procedures ? ?Medications Ordered in the ED ?Medications  ?ondansetron (ZOFRAN-ODT) disintegrating tablet 4 mg (4 mg Oral Given 09/02/21 0015)  ? ? ?Initial Impression and Plan ? Patient here for evaluation of depression and SI. He denies hallucinations. He has just had an admission for stabilizing his medical issues, discharged today. I discussed the possibility that TTS/Psych will recommend psychiatric admission and he is reluctant because he feels it will not help his situation. That being said he does not have any other ideas what would be helpful for him. Labs ordered in triage are pending.  ? ?ED Course  ? ?Clinical Course as of 09/02/21 0031  ?Wed Sep 01, 2021  ?2338 CBC is at baseline anemia.  [CS]  ?2339 CMP with CKD  at baseline.  [CS]  ?2339 Trop at baseline.  [CS]  ?2339 I personally viewed the images from radiology studies and agree with radiologist interpretation: CXR with cardiomegaly, mild CHF similar to previous.  ? [CS]  ?Thu Sep 02, 2021  ?0029 Patient now states he would like to go home. He is no longer suicidal and states he thought he would have someone to sit and talk with him while he was in the ED. I explained that the TTS consult would be via telehealth and that it might be several more hours before they are available to speak with him. He does not wish to wait any longer.  He is not suicidal now. Feels safe going home. Admits he was more depressed over being alone at home more than anything else. I recommend he discuss with his PCP and seek outpatient mental health resources.  [CS]  ?  ?Clinical Course User Index ?[CS] Truddie Hidden, MD  ? ? ? ?MDM Rules/Calculators/A&P ?Medical Decision Making ?Problems Addressed: ?Depression, unspecified depression type: acute illness or injury ? ?Amount and/or Complexity of Data Reviewed ?Labs: ordered. Decision-making details documented in ED Course. ?Radiology: ordered and independent interpretation performed. Decision-making details documented in ED Course. ?ECG/medicine tests: ordered and independent interpretation performed. Decision-making details documented in ED Course. ? ?Risk ?Prescription drug management. ?Decision regarding hospitalization. ? ? ? ?Final Clinical Impression(s) / ED Diagnoses ?Final diagnoses:  ?Depression, unspecified depression type  ? ? ?Rx / DC Orders ?ED Discharge Orders   ? ? None  ? ?  ? ?  ?Truddie Hidden, MD ?09/02/21 0031 ? ?

## 2021-09-01 NOTE — Discharge Summary (Signed)
?Physician Discharge Summary ?  ?Patient: [name] MRN: [id number] DOB: 1955/07/03  ?Admit date:     08/29/2021  ?Discharge date: 09/01/21  ?Discharge Physician: Shanon Brow Rolin Schult  ? ?PCP: [name], MD  ? ?Recommendations at discharge:  ? Please follow up with primary care provider within 1-2 weeks ? Please repeat BMP and CBC in one week ? ? ? ?Hospital Course: ?66 y.o. male with medical history significant for systolic and diastolic CHF, CKD4, diabetes mellitus, hypertension, CABG 2010, GI bleed, peripheral artery disease, esophageal cancer. ?Patient presented to the ED with complaints of a fall, generalized weakness, and swelling of his abdomen, hands and feet.  Reports weakness has been ongoing for the past several months.  He reports he has been having some swelling since he was discharged from the hospital 08/02/2021, but over the past 2 days, the swelling has significantly worsened.  Reports compliance with his torsemide.  As much as he can he tries to adhere to a low-salt diet.  Reports some difficulty breathing.  No chest pain. ?Reports 3 episodes of vomiting today, he has chronic nausea.  No abdominal pain, but reports abdominal swelling from fluid. ?Patient reports he was standing and fell because he felt dizzy.  He has chronic dizziness.  He uses a cane for ambulation.  [name] at bedside reports that patient has not had heating or hot water at his place for days.  In the ED, patient's weight is 213 pounds. ?  ?Recent hospitalization 2/11 to 2/20 for decompensated CHF, discharge weight was 198 pounds.  Medications were also adjusted, torsemide 40 mg daily/20 mg in the evening. ?  ?ED Course: Blood pressure elevated to 195/114.  Heart rate 78-88.  Blood pressure systolic 00-86.  O2 sats greater than 94% on room air. ?BNP elevated at 3471.  Creatinine at baseline 3.79.  Troponin 36.  Chest x-ray without acute abnormality.  IV Lasix 80 mg x 1 given in ED.  Hospitalist to admit for decompensated  CHF. ? ?08/30/2021:  pt has diuresed nearly 2.5L since admission and starting to feel better. He is not back to his baseline and his weight is up.  Cardiology consulted and recommendations to continue IV lasix.   Creatinine holding stable.  Repeat labs in AM.  ? ?08/31/2021: renal function stable, continues to diurese well, sitting up in chair feeling much better, cardiology recommending 1 more day of IV lasix diuresis and possible DC home 3/22.   ? ?Assessment and Plan: ?CKD (chronic kidney disease) stage 4, GFR 15-29 ml/min (HCC) ?Baseline creatinine 3.5-3.8 ?-Monitor closely with diuresis. ?-Pt followed outpatient by Dr. Clover Mealy ?-phos within normal limits ?Serum creatinine 3.75 at time of dc ? ?Essential hypertension, benign ?Suboptimally controlled.   Recent medication adjustment with hydralazine increased to 100 mg TID. ?-Resume carvedilol 12.5 twice daily, Isordil 30 3 times daily, hydralazine, ?-As needed IV labetalol 10 mg for systolic greater than 761. ?-continue IV lasix for diuresis.  ? ?Acute on chronic HFrEF (heart failure with reduced ejection fraction) ([place]) ?2+ pitting lower extremity edema, abdominal swelling, BNP elevated at 3471.  Weight gain since recent hospitalization of ~ 15lbs.  Chest x-ray clear.  Reports compliance with torsemide.  Recent echo 07/2021, EF of 40 to 45%. ?-continued 60 mg IV lasix twice daily ?-Monitor renal function closely ?-Cardiology consult appreciated ?-d/c weight 203.2 lbs (NEG 10 lbs) ? ?Obstructive sleep apnea ?CPAP nightly ordered.  ? ?DM type 2 causing vascular disease ([place]) ?Controlled, A1c 6.4. ?- SSI- M ?-  Hold home semaglutide ?CBG (last 3)  ?Recent Labs  ?  08/30/21 ?2049 08/31/21 ?0727 08/31/21 ?1112  ?GLUCAP 156* 182* 167*  ? ? ? ?S/P CABG (coronary artery bypass graft) ?No chest pain.  EKG similar to prior.  Troponin 36 > 35. ?ACS ruled out.  ? ?HFrEF (heart failure with reduced ejection fraction) (HCC)-resolved as of 09/01/2021 ?2+ pitting lower extremity  edema, abdominal swelling, BNP elevated at 3471.  Weight gain since recent hospitalization of ~ 15lbs.  Chest x-ray clear.  Reports compliance with torsemide.  Recent echo 07/2021, EF of 40 to 45%. ?-continued 60 mg IV lasix twice daily ?-Monitor renal function closely ?-Cardiology consult appreciated ?-d/c weight 203.2 lbs (NEG 10 lbs) ? ? ? ? ? ?  ? ? ?Consultants: cardiology ?Procedures performed: none  ?Disposition: Home ?Diet recommendation:  ?Cardiac diet ?DISCHARGE MEDICATION: ?Allergies as of 09/01/2021   ? ?   Reactions  ? Bee Venom Anaphylaxis  ? Atorvastatin   ? myalgia  ? Diltiazem Itching  ? Rosuvastatin   ? myalgias  ? Valsartan Itching  ? ?  ? ?  ?Medication List  ?  ? ?STOP taking these medications   ? ?azelastine 0.1 % nasal spray ?Commonly known as: ASTELIN ?  ?ferrous sulfate 325 (65 FE) MG tablet ?  ?fluticasone 50 MCG/ACT nasal spray ?Commonly known as: FLONASE ?  ?QUEtiapine 25 MG tablet ?Commonly known as: SEROquel ?  ? ?  ? ?TAKE these medications   ? ?acetaminophen 325 MG tablet ?Commonly known as: TYLENOL ?Take 2 tablets (650 mg total) by mouth every 6 (six) hours as needed for mild pain, moderate pain or fever. ?  ?albuterol 108 (90 Base) MCG/ACT inhaler ?Commonly known as: VENTOLIN HFA ?Inhale 2 puffs into the lungs every 6 (six) hours as needed. ?What changed: reasons to take this ?  ?ascorbic acid 500 MG tablet ?Commonly known as: VITAMIN C ?Take 1 tablet (500 mg total) by mouth daily. ?  ?aspirin 81 MG EC tablet ?Take 4 tablets (324 mg total) by mouth daily. Swallow whole. ?  ?B-D ULTRAFINE III SHORT PEN 31G X 8 MM Misc ?Generic drug: Insulin Pen Needle ?1 each by Does not apply route as directed. ?  ?carvedilol 12.5 MG tablet ?Commonly known as: COREG ?Take 1 tablet (12.5 mg total) by mouth 2 (two) times daily. ?  ?doxazosin 4 MG tablet ?Commonly known as: CARDURA ?Take 4 mg by mouth daily. ?  ?EPINEPHrine 0.3 mg/0.3 mL Soaj injection ?Commonly known as: EPI-PEN ?Inject 0.3 mg into the  muscle as needed for anaphylaxis. ?  ?hydrALAZINE 100 MG tablet ?Commonly known as: APRESOLINE ?Take 1 tablet (100 mg total) by mouth 3 (three) times daily. ?What changed:  ?medication strength ?how much to take ?  ?hydrOXYzine 25 MG capsule ?Commonly known as: VISTARIL ?1 nightly as needed allergies and insomnia ?What changed:  ?how much to take ?when to take this ?reasons to take this ?additional instructions ?  ?isosorbide dinitrate 30 MG tablet ?Commonly known as: ISORDIL ?Take 1 tablet (30 mg total) by mouth 3 (three) times daily. ?  ?metoCLOPramide 10 MG tablet ?Commonly known as: REGLAN ?TAKE ONE TABLET ('10MG'$  TOTAL) BY MOUTH THREE TIMES DAILY BEFORE MEALS ?  ?multivitamin with minerals Tabs tablet ?Take 1 tablet by mouth daily. ?  ?nitroGLYCERIN 0.4 MG SL tablet ?Commonly known as: NITROSTAT ?Place 1 tablet (0.4 mg total) under the tongue every 5 (five) minutes as needed for chest pain. ?  ?ondansetron 4 MG disintegrating tablet ?  Commonly known as: ZOFRAN-ODT ?Take 1 tablet (4 mg total) by mouth every 8 (eight) hours as needed for nausea or vomiting. ?  ?OZEMPIC (1 MG/DOSE) [place] ?Inject 1 mg into the skin once a week. ?  ?pantoprazole 40 MG tablet ?Commonly known as: PROTONIX ?Take 1 tablet (40 mg total) by mouth 2 (two) times daily before a meal. ?  ?Repatha SureClick 865 MG/ML Soaj ?Generic drug: Evolocumab ?Inject 140 mg as directed every 14 (fourteen) days. ?  ?sertraline 100 MG tablet ?Commonly known as: ZOLOFT ?TAKE 1 TABLET BY MOUTH DAILY ?  ?Torsemide 40 MG Tabs ?Take 40 mg by mouth daily. ?What changed: Another medication with the same name was removed. Continue taking this medication, and follow the directions you see here. ?  ?traMADol 50 MG tablet ?Commonly known as: ULTRAM ?Take 25 mg by mouth every 6 (six) hours as needed for moderate pain. ?  ?VITAMIN D PO ?Take 1,000 Units by mouth daily. ?  ? ?  ? ? Follow-up Information   ? ? Loel Dubonnet, NP Follow up on 09/14/2021.   ?Specialty:  Cardiology ?Why: Cardiology Hospital Follow-up on 09/14/2021 at 1:30 PM. ?Contact information: ?[address] ?[place] [address] ?[phone] ? ? ?  ?  ? ?  ?  ? ?  ? ?Discharge Exam: ?Filed Weights  ? 03/2

## 2021-09-01 NOTE — Assessment & Plan Note (Signed)
2+ pitting lower extremity edema, abdominal swelling, BNP elevated at 3471.  Weight gain since recent hospitalization of ~ 15lbs.  Chest x-ray clear.  Reports compliance with torsemide.  Recent echo 07/2021, EF of 40 to 45%. ?-continued 60 mg IV lasix twice daily ?-Monitor renal function closely ?-Cardiology consult appreciated ?-d/c weight 203.2 lbs (NEG 10 lbs) ?

## 2021-09-01 NOTE — ED Triage Notes (Signed)
Pt BIB [facility] voluntarily with reports of having SI today after being discharged from the hospital; pt reports he is "tired of being alone" reports the "one person I care about I asked if I could stay with her and she said no"; pt reports he has had SI before but has been many many years ?

## 2021-09-01 NOTE — Evaluation (Signed)
Physical Therapy Evaluation ?Patient Details ?Name: [name] ?MRN: [id number] ?DOB: 06/25/1955 ?Today's Date: 09/01/2021 ? ?History of Present Illness ? [name] is a 66 y.o. male with medical history significant for systolic and diastolic CHF, CKD4, diabetes mellitus, hypertension, CABG 2010, GI bleed, peripheral artery disease, esophageal cancer.  Patient presented to the ED with complaints of a fall, generalized weakness, and swelling of his abdomen, hands and feet.  Reports weakness has been ongoing for the past several months.  He reports he has been having some swelling since he was discharged from the [name] 08/02/2021, but over the past 2 days, the swelling has significantly worsened.  Reports compliance with his torsemide.  As much as he can he tries to adhere to a low-salt diet.  Reports some difficulty breathing.  No chest pain.  Reports 3 episodes of vomiting today, he has chronic nausea.  No abdominal pain, but reports abdominal swelling from fluid.  Patient reports he was standing and fell because he felt dizzy.  He has chronic dizziness.  He uses a cane for ambulation.  [name] at bedside reports that patient has not had heating or hot water at his place for days.  In the ED, patient's weight is 213 pounds. ?  ?Clinical Impression ? Patient functioning near baseline for functional mobility and gait other than having difficulty attempting to use his quad-cane for transfers and gait due to generalized weakness, required use of RW for safety demonstrating good return for use during sit to stands, transfers and ambulation in room/hallway without loss of balance.  Patient tolerated sitting up in chair after therapy and encouraged to ambulate to bathroom with nursing staff supervising.  PLAN:  Patient to be discharged home today and discharged from acute physical therapy to care of nursing for ambulation as tolerated for length of stay with recommendations stated below. ? ? ?   ?    ? ?Recommendations for follow up therapy are one component of a multi-disciplinary discharge planning process, led by the attending physician.  Recommendations may be updated based on patient status, additional functional criteria and insurance authorization. ? ?Follow Up Recommendations Home health PT ? ?  ?Assistance Recommended at Discharge Set up Supervision/Assistance  ?Patient can return home with the following ? A little help with walking and/or transfers;Help with stairs or ramp for entrance;Assistance with cooking/housework;A little help with bathing/dressing/bathroom ? ?  ?Equipment Recommendations None recommended by PT  ?Recommendations for Other Services ?    ?  ?Functional Status Assessment Patient has had a recent decline in their functional status and demonstrates the ability to make significant improvements in function in a reasonable and predictable amount of time.  ? ?  ?Precautions / Restrictions Precautions ?Precautions: Fall ?Restrictions ?Weight Bearing Restrictions: No  ? ?  ? ?Mobility ? Bed Mobility ?Overal bed mobility: Modified Independent ?  ?  ?  ?  ?  ?  ?  ?  ? ?Transfers ?Overall transfer level: Needs assistance ?Equipment used: Rolling walker (2 wheels) ?Transfers: Sit to/from Stand, Bed to chair/wheelchair/BSC ?Sit to Stand: Min guard, Supervision ?  ?Step pivot transfers: Modified independent (Device/Increase time), Supervision ?  ?  ?  ?General transfer comment: had diffiuclty attempting sit to stands using quad-cane, required use of RW for safety with good return for completing sit to stands after verbal cues ?  ? ?Ambulation/Gait ?Ambulation/Gait assistance: Modified independent (Device/Increase time), Supervision ?Gait Distance (Feet): 65 Feet ?Assistive device: Rolling walker (2 wheels) ?Gait Pattern/deviations: Decreased  step length - right, Decreased step length - left, Decreased stride length ?Gait velocity: decreased ?  ?  ?General Gait Details: slightly labored cadence  with occasional standing rest breaks, no loss of balance and limited mostly due to fatigue ? ?Stairs ?  ?  ?  ?  ?  ? ?Wheelchair Mobility ?  ? ?Modified Rankin (Stroke Patients Only) ?  ? ?  ? ?Balance Overall balance assessment: Needs assistance ?Sitting-balance support: Feet supported, No upper extremity supported ?Sitting balance-Leahy Scale: Good ?Sitting balance - Comments: seated at EOB ?  ?Standing balance support: During functional activity, Single extremity supported ?Standing balance-Leahy Scale: Poor ?Standing balance comment: fair/good using RW ?  ?  ?  ?  ?  ?  ?  ?  ?  ?  ?  ?   ? ? ? ?Pertinent Vitals/Pain Pain Assessment ?Pain Assessment: Faces ?Faces Pain Scale: Hurts a little bit ?Pain Location: right knee ?Pain Descriptors / Indicators: Sore ?Pain Intervention(s): Limited activity within patient's tolerance, Monitored during session  ? ? ?Home Living Family/patient expects to be discharged to:: Private residence ?Living Arrangements: Alone ?Available Help at Discharge: Family;Friend(s);Available PRN/intermittently ?Type of Home: House ?Home Access: Stairs to enter ?Entrance Stairs-Rails: Right ?Entrance Stairs-Number of Steps: 7 ?Alternate Level Stairs-Number of Steps: 6 + 6 ?Home Layout: Multi-level;Able to live on main level with bedroom/bathroom;Full bath on main level ?Home Equipment: Grab bars - tub/shower;Rolling Walker (2 wheels);Cane - quad ?   ?  ?Prior Function   ?  ?  ?  ?  ?  ?  ?Mobility Comments: household and short distanced community ambulator using Quad-cane, does not drive ?ADLs Comments: Assisted for community ADLs ?  ? ? ?Hand Dominance  ? Dominant Hand: Right ? ?  ?Extremity/Trunk Assessment  ? Upper Extremity Assessment ?Upper Extremity Assessment: Overall WFL for tasks assessed ?  ? ?Lower Extremity Assessment ?Lower Extremity Assessment: Generalized weakness ?  ? ?Cervical / Trunk Assessment ?Cervical / Trunk Assessment: Normal  ?Communication  ? Communication: No  difficulties  ?Cognition Arousal/Alertness: Awake/alert ?Behavior During Therapy: [name] for tasks assessed/performed ?Overall Cognitive Status: Within Functional Limits for tasks assessed ?  ?  ?  ?  ?  ?  ?  ?  ?  ?  ?  ?  ?  ?  ?  ?  ?  ?  ?  ? ?  ?General Comments   ? ?  ?Exercises    ? ?Assessment/Plan  ?  ?PT Assessment All further PT needs can be met in the next venue of care  ?PT Problem List Decreased strength;Decreased activity tolerance;Decreased balance;Decreased mobility ? ?   ?  ?PT Treatment Interventions     ? ?PT Goals (Current goals can be found in the Care Plan section)  ?Acute Rehab PT Goals ?Patient Stated Goal: return home with family/friends to assist ?PT Goal Formulation: With patient ?Time For Goal Achievement: 09/01/21 ?Potential to Achieve Goals: Good ? ?  ?Frequency   ?  ? ? ?Co-evaluation   ?  ?  ?  ?  ? ? ?  ?AM-PAC PT "6 Clicks" Mobility  ?Outcome Measure Help needed turning from your back to your side while in a flat bed without using bedrails?: None ?Help needed moving from lying on your back to sitting on the side of a flat bed without using bedrails?: None ?Help needed moving to and from a bed to a chair (including a wheelchair)?: A Little ?Help needed standing up from a  chair using your arms (e.g., wheelchair or bedside chair)?: A Little ?Help needed to walk in [name] room?: A Little ?Help needed climbing 3-5 steps with a railing? : A Little ?6 Click Score: 20 ? ?  ?End of Session   ?Activity Tolerance: Patient tolerated treatment well;Patient limited by fatigue ?Patient left: in chair;with call bell/phone within reach ?Nurse Communication: Mobility status ?PT Visit Diagnosis: Unsteadiness on feet (R26.81);Other abnormalities of gait and mobility (R26.89);Muscle weakness (generalized) (M62.81) ?  ? ?Time: 9390-3009 ?PT Time Calculation (min) (ACUTE ONLY): 28 min ? ? ?Charges:   PT Evaluation ?$PT Eval Moderate Complexity: 1 Mod ?PT Treatments ?$Therapeutic Activity: 23-37 mins ?  ?    ? ? ?12:29 PM, 09/01/21 ?Lonell Grandchild, MPT ?Physical Therapist with [facility] ?[name] ?[phone] office ?[phone] mobile phone ? ? ?

## 2021-09-02 ENCOUNTER — Telehealth: Payer: Self-pay

## 2021-09-02 LAB — ETHANOL: Alcohol, Ethyl (B): <10 mg/dL (ref <10)

## 2021-09-02 LAB — ACETAMINOPHEN LEVEL: Acetaminophen (Tylenol), Serum: <10 ug/mL — ABNORMAL LOW (ref 10–30)

## 2021-09-02 LAB — SALICYLATE LEVEL: Salicylate Lvl: <7 mg/dL — ABNORMAL LOW (ref 7.0–30.0)

## 2021-09-02 MED ORDER — ONDANSETRON 4 MG PO TBDP
4.0000 mg | ORAL_TABLET | Freq: Once | ORAL | Status: AC
  Administered 2021-09-02: 4 mg via ORAL
  Filled 2021-09-02: qty 1

## 2021-09-02 NOTE — ED Notes (Signed)
Pt states that if he has to be by himself then he would rather go home. Edp notified.  ?

## 2021-09-02 NOTE — Telephone Encounter (Signed)
Transition Care Management Unsuccessful Follow-up Telephone Call ? ?Date of discharge and from where:  09/01/21 [facility] ? ?Attempts:  1st Attempt ? ?Reason for unsuccessful TCM follow-up call:  Left voice message ? ? ? ?

## 2021-09-03 NOTE — Telephone Encounter (Signed)
Transition Care Management Unsuccessful Follow-up Telephone Call ? ?Date of discharge and from where:  09/01/21 [facility] ? ?Attempts:  2nd Attempt ? ?Reason for unsuccessful TCM follow-up call:  Left voice message ? ?PT [place] FOLLOW UP VISIT SCHEDULED FOR 09/16/21 @ 1:30 PM. ? ? ? ?

## 2021-09-06 ENCOUNTER — Telehealth: Payer: Self-pay | Admitting: Family Medicine

## 2021-09-06 NOTE — Telephone Encounter (Signed)
Pt states he spoke with Dr.Scott regarding getting him help at home about 2 hours a day. Pt states he has not heard anything at this time. Please advise. Thank you ?[phone] ?

## 2021-09-07 ENCOUNTER — Telehealth: Payer: Self-pay | Admitting: Licensed Clinical Social Worker

## 2021-09-07 NOTE — Telephone Encounter (Signed)
Nurses ?Previously I filled out a long form for him to be evaluated for a assistant to come out through some Medicaid program. ?Hopefully this is somewhere within the media portion of the medical record (but I am not optimistic of that) ?I would recommend looking into this please ?If you are not finding any evidence of this please have social worker with chronic care management connect with patient to try to help get him this help ?If there are additional forms for [place] to fill out I will be happy to do so ?Please let the patient know that I did fill these forms out and sent them in ?Please give him an update on this current process thank you ?

## 2021-09-07 NOTE — Telephone Encounter (Signed)
Patient notified

## 2021-09-07 NOTE — Telephone Encounter (Signed)
Left message to return call 

## 2021-09-07 NOTE — Telephone Encounter (Signed)
LCSW contacted by PCP office as pt had contacted them that [facility] had received application but was missing provider NPI. LCSW received NPI from Autumn, RN, w. Dr. Lance Sell office. This was added and refaxed. I remain available, I have provided Autumn with [facility]'s contact number and information about how process works/why PCP involvement requested. I remain available as needed.  ? ?[name], MSW, LCSW ?Clinical Social Worker II ?[facility]  ?[phone]- work cell phone (preferred) ?[phone]- desk phone ? ?

## 2021-09-07 NOTE — Telephone Encounter (Signed)
Please make sure that the patient is updated regarding this-thank you ?

## 2021-09-09 ENCOUNTER — Ambulatory Visit (INDEPENDENT_AMBULATORY_CARE_PROVIDER_SITE_OTHER): Payer: Medicare HMO | Admitting: Physician Assistant

## 2021-09-09 ENCOUNTER — Other Ambulatory Visit: Payer: Self-pay

## 2021-09-09 DIAGNOSIS — T24331A Burn of third degree of right lower leg, initial encounter: Secondary | ICD-10-CM

## 2021-09-09 NOTE — Progress Notes (Signed)
? ?Referring Provider ?[name], MD ?[address] ?[address] ?[place],  [place] [address]  ? ?CC:  ?Chief Complaint  ?Patient presents with  ? Follow-up  ?   ? ?[name] is an 66 y.o. male.  ?HPI:  ?Patient is a 66 year old male with PMH of right lower extremity full-thickness burn s/p debridement with placement of STSG performed 06/28/2021 by Dr. Claudia Desanctis who presents to clinic for postoperative follow-up. ? ?He was last seen here in clinic on 08/04/2021.  At that time, he mentioned that he developed a couple ulcerations at donor site during his hospitalization for CHF exacerbation.  Exam was reassuring that there was full epithelialization and take of the skin graft distally.  All the staples were removed.  Plan was for Vaseline and Telfa for the ulcerative area as the donor site.  He also expressed interest in upper eyelid blepharoplasty for dermatochalasis. ? ?Today, patient is accompanied by his wife at bedside.  He states that the ulcers at donor site had healed completely.  He has had a few scattered areas of skin breakdown/ulcerations at donor site.  He denies any pain.  Denies any significant lower extremity edema recently.  He has been applying a diabetic cream on his legs.  ? ? ?Allergies  ?Allergen Reactions  ? Bee Venom Anaphylaxis  ? Atorvastatin   ?  myalgia  ? Diltiazem Itching  ? Rosuvastatin   ?  myalgias  ? Valsartan Itching  ? ? ?Outpatient Encounter Medications as of 09/09/2021  ?Medication Sig Note  ? acetaminophen (TYLENOL) 325 MG tablet Take 2 tablets (650 mg total) by mouth every 6 (six) hours as needed for mild pain, moderate pain or fever.   ? albuterol (VENTOLIN HFA) 108 (90 Base) MCG/ACT inhaler Inhale 2 puffs into the lungs every 6 (six) hours as needed. (Patient taking differently: Inhale 2 puffs into the lungs every 6 (six) hours as needed for wheezing or shortness of breath.)   ? ascorbic acid (VITAMIN C) 500 MG tablet Take 1 tablet (500 mg total) by mouth daily.   ? aspirin EC  81 MG EC tablet Take 4 tablets (324 mg total) by mouth daily. Swallow whole.   ? carvedilol (COREG) 12.5 MG tablet Take 1 tablet (12.5 mg total) by mouth 2 (two) times daily.   ? doxazosin (CARDURA) 4 MG tablet Take 4 mg by mouth daily.   ? EPINEPHrine 0.3 mg/0.3 mL IJ SOAJ injection Inject 0.3 mg into the muscle as needed for anaphylaxis.   ? hydrALAZINE (APRESOLINE) 100 MG tablet Take 1 tablet (100 mg total) by mouth 3 (three) times daily.   ? hydrOXYzine (VISTARIL) 25 MG capsule 1 nightly as needed allergies and insomnia (Patient taking differently: 25 mg at bedtime as needed. allergies and insomnia)   ? Insulin Pen Needle (B-D ULTRAFINE III SHORT PEN) 31G X 8 MM MISC 1 each by Does not apply route as directed.   ? isosorbide dinitrate (ISORDIL) 30 MG tablet Take 1 tablet (30 mg total) by mouth 3 (three) times daily.   ? metoCLOPramide (REGLAN) 10 MG tablet TAKE ONE TABLET ('10MG'$  TOTAL) BY MOUTH THREE TIMES DAILY BEFORE MEALS   ? Multiple Vitamin (MULTIVITAMIN WITH MINERALS) TABS tablet Take 1 tablet by mouth daily.   ? nitroGLYCERIN (NITROSTAT) 0.4 MG SL tablet Place 1 tablet (0.4 mg total) under the tongue every 5 (five) minutes as needed for chest pain.   ? ondansetron (ZOFRAN-ODT) 4 MG disintegrating tablet Take 1 tablet (4 mg  total) by mouth every 8 (eight) hours as needed for nausea or vomiting.   ? pantoprazole (PROTONIX) 40 MG tablet Take 1 tablet (40 mg total) by mouth 2 (two) times daily before a meal.   ? REPATHA SURECLICK 559 MG/ML SOAJ Inject 140 mg as directed every 14 (fourteen) days. 08/30/2021: Due 08-30-21  ? Semaglutide (OZEMPIC, 1 MG/DOSE, [place]) Inject 1 mg into the skin once a week. 08/30/2021: Monday   ? sertraline (ZOLOFT) 100 MG tablet TAKE 1 TABLET BY MOUTH DAILY   ? Torsemide 40 MG TABS Take 40 mg by mouth daily.   ? traMADol (ULTRAM) 50 MG tablet Take 25 mg by mouth every 6 (six) hours as needed for moderate pain.   ? VITAMIN D PO Take 1,000 Units by mouth daily.   ? ?No  facility-administered encounter medications on file as of 09/09/2021.  ?  ? ?Past Medical History:  ?Diagnosis Date  ? Anemia   ? Arthritis   ? hips shoulders  ? CAD (coronary artery disease) 03/03/2019  ? Cancer [facility])   ? Esophageal cancer 2022  ? CHF (congestive heart failure) ([place]) 2020  ? Chronic combined systolic and diastolic heart failure ([place]) 04/26/2018  ? Admitted 11/14-11/20/19-diuresed 10L  ? Depression 05/31/2021  ? Fatigue 10/14/2020  ? Hypertension   ? Myocardial infarction [facility])   ? Sleep apnea   ? uses a bipap machine  ? Stroke ([place]) 12/14/2020  ? no last weakness or paralysis  ? 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 ? ?Past Surgical History:  ?Procedure Laterality Date  ? BIOPSY  06/27/2019  ? Procedure: BIOPSY;  Surgeon: Rogene Houston, MD;  [facility];  Service: Endoscopy;;  ascending colon polyp  ? BIOPSY  10/21/2020  ? Procedure: BIOPSY;  Surgeon: Rogene Houston, MD;  [facility];  Service: Endoscopy;;  esophageal,gastric polyp  ? BIOPSY  02/24/2021  ? Procedure: BIOPSY;  Surgeon: Rogene Houston, MD;  [facility];  Service: Endoscopy;;  distal and proximal esophageal biopsies   ? CARDIAC SURGERY    ? CATARACT EXTRACTION    ? COLONOSCOPY N/A 06/27/2019  ? [name]:Diverticulosis in the entire examined colon. tubular adenoma in ascending colon, 60m tubular adenoma in prox sigmoid. external hemorrhoids  ? CORONARY ARTERY BYPASS GRAFT N/A 03/25/2019  ? Procedure: CORONARY ARTERY BYPASS GRAFTING (CABG) X  4 USING LEFT INTERNAL MAMMARY ARTERY AND RIGHT SAPHENOUS VEIN GRAFTS;  Surgeon: LLajuana Matte MD;  [facility] [facility]  Service: Open Heart Surgery;  Laterality: N/A;  ? ESOPHAGOGASTRODUODENOSCOPY (EGD) WITH PROPOFOL N/A 10/21/2020  ? rehman:Normal hypopharynx.Normal proximal esophagus and mid esophagus.Esophageal mucosal changes consistent with long-segment Barrett's esophagus.  (Focal low-grade dysplasia and atypia proximally) z line irregular 37 cm from incisors, 3 cm HH, single gastric polyp (fundic gland) normal duodenal bulb/second portion of duodenum, proximal margin of Barrett's at 34 cm  ? ESOPHAGOGASTRODUODENOSCOPY (EGD) WITH PROPOFOL N/A 02/24/2021  ? Procedure: ESOPHAGOGASTRODUODENOSCOPY (EGD) WITH PROPOFOL;  Surgeon: RRogene Houston MD;  [facility];  Service: Endoscopy;  Laterality: N/A;  1:35  ? ESOPHAGOGASTRODUODENOSCOPY (EGD) WITH PROPOFOL N/A 03/21/2021  ? Procedure: ESOPHAGOGASTRODUODENOSCOPY (EGD) WITH PROPOFOL;  Surgeon: HCarol Ada MD;  [facility]  Service: Endoscopy;  Laterality: N/A;  ? EXCISION MORTON'S NEUROMA    ? EYE SURGERY Left   ? retina  ? INCISION AND DRAINAGE OF WOUND Right 06/28/2021  ? Procedure:  IRRIGATION AND DEBRIDEMENT WOUND;  Surgeon: [name], MD;  Location: [facility];  Service: Plastics;  Laterality: Right;  1.5 hour  ? POLYPECTOMY  06/27/2019  ? Procedure: POLYPECTOMY;  Surgeon: Rogene Houston, MD;  [facility];  Service: Endoscopy;;  proximal sigmoid colon  ? RIGHT/LEFT HEART CATH AND CORONARY ANGIOGRAPHY N/A 03/12/2019  ? Procedure: RIGHT/LEFT HEART CATH AND CORONARY ANGIOGRAPHY;  Surgeon: [name], MD;  Location: [facility];  Service: Cardiovascular;  Laterality: N/A;  ? SHOULDER ARTHROSCOPY WITH ROTATOR CUFF REPAIR AND SUBACROMIAL DECOMPRESSION Right 08/26/2020  ? Procedure: RIGHT SHOULDER MINI OPEN ROTATOR CUFF REPAIR AND SUBACROMIAL DECOMPRESSION WITH PATCH GRAFT;  Surgeon: Susa Day, MD;  Location[facility];  Service: Orthopedics;  Laterality: Right;  90 MINS ?GENERAL WITH BLOCK  ? SKIN SPLIT GRAFT Right 06/28/2021  ? Procedure: SKIN GRAFT SPLIT THICKNESS;  Surgeon: [name], MD;  Location: [facility];  Service: Plastics;  Laterality: Right;  ? TEE WITHOUT CARDIOVERSION N/A 03/25/2019  ? Procedure: TRANSESOPHAGEAL ECHOCARDIOGRAM (TEE);  Surgeon: [name], MD;   Location: [place] [facility];  Service: Open Heart Surgery;  Laterality: N/A;  ? ? ?Family History  ?Problem Relation Age of Onset  ? Colon cancer Mother   ? Heart Problems Father   ? Diabetes Father   ? Valvular heart disease Father   ? S

## 2021-09-10 ENCOUNTER — Telehealth: Payer: Self-pay | Admitting: Licensed Clinical Social Worker

## 2021-09-10 NOTE — Telephone Encounter (Signed)
LCSW called and spoke with representative at [facility] regarding PCS application for pt. Representative confirmed application received, it will likely take until next Wednesday until it is processed. Pt can call for any updates even if he doesn't hear from them, but advised to wait until later in the week to give them time to process.  ? ?LCSW called pt at [phone]. Advised him of the above. Pt aware and requested I text him the number for [place]. Pt was able to have his gas restored, does owe a security deposit of ~$300. Currently pt should meet criteria for [facility], will need to bring bill and statement of income. I have reached out to [place] who he is scheduled to see on 4/4. If he brings those items then we can have him complete the acknowledgement form.  ? ?Pt also inquiring about standing walker, I deferred this to PCP and will make a note on the upcoming appt he has next week. Pt encouraged to reach out to me with any questions/concerns moving forward.  ? ?Westley Hummer, MSW, LCSW ?Clinical Social Worker II ?[facility]  ?[phone]- work cell phone (preferred) ?[phone]- desk phone ? ?

## 2021-09-13 ENCOUNTER — Telehealth (INDEPENDENT_AMBULATORY_CARE_PROVIDER_SITE_OTHER): Payer: Self-pay | Admitting: *Deleted

## 2021-09-13 ENCOUNTER — Other Ambulatory Visit (INDEPENDENT_AMBULATORY_CARE_PROVIDER_SITE_OTHER): Payer: Self-pay | Admitting: *Deleted

## 2021-09-13 MED ORDER — ONDANSETRON 4 MG PO TBDP: 4.0000 mg | ORAL_TABLET | Freq: Three times a day (TID) | ORAL | 1 refills | Status: DC | PRN

## 2021-09-13 NOTE — Telephone Encounter (Signed)
Refill sent through the rx request folder and I called and let pt know it was sent. thanks ?

## 2021-09-13 NOTE — Progress Notes (Signed)
? ?Office Visit  ?  ?Patient Name: [name] ?Date of Encounter: 09/14/2021 ? ?PCP:  [name], MD ?  ?[place]  ?Cardiologist:  Skeet Latch, MD ?Advanced Practice Provider:  No care team member to display ?Electrophysiologist:  None  ? ?Chief Complaint  ?  ?[name] is a 66 y.o. male with a hx of CVA, combined systolic and diastolic heart failure, PAD, esophageal cancer, CAD post CABG, HTN, MI, DM2, CKD 3, PVC  presents today for hospital follow up. ? ?Past Medical History  ?  ?Past Medical History:  ?Diagnosis Date  ? Anemia   ? Arthritis   ? hips shoulders  ? CAD (coronary artery disease) 03/03/2019  ? Cancer [facility])   ? Esophageal cancer 2022  ? CHF (congestive heart failure) ([place]) 2020  ? Chronic combined systolic and diastolic heart failure ([place]) 04/26/2018  ? Admitted 11/14-11/20/19-diuresed 10L  ? Depression 05/31/2021  ? Fatigue 10/14/2020  ? Hypertension   ? Myocardial infarction [facility])   ? Sleep apnea   ? uses a bipap machine  ? Stroke ([place]) 12/14/2020  ? no last weakness or paralysis  ? Type 2 diabetes mellitus with diabetic nephropathy ([place]) 04/23/2019  ? 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 ?Past Surgical History:  ?Procedure Laterality Date  ? BIOPSY  06/27/2019  ? Procedure: BIOPSY;  Surgeon: Rogene Houston, MD;  [facility];  Service: Endoscopy;;  ascending colon polyp  ? BIOPSY  10/21/2020  ? Procedure: BIOPSY;  Surgeon: Rogene Houston, MD;  [facility];  Service: Endoscopy;;  esophageal,gastric polyp  ? BIOPSY  02/24/2021  ? Procedure: BIOPSY;  Surgeon: Rogene Houston, MD;  [facility];  Service: Endoscopy;;  distal and proximal esophageal biopsies   ? CARDIAC SURGERY    ? CATARACT EXTRACTION    ? COLONOSCOPY N/A 06/27/2019  ? [name]:Diverticulosis in the entire examined colon. tubular adenoma in ascending colon, 73m tubular adenoma in prox sigmoid. external hemorrhoids  ? CORONARY ARTERY BYPASS GRAFT N/A  03/25/2019  ? Procedure: CORONARY ARTERY BYPASS GRAFTING (CABG) X  4 USING LEFT INTERNAL MAMMARY ARTERY AND RIGHT SAPHENOUS VEIN GRAFTS;  Surgeon: LLajuana Matte MD;  Location: [place]  Service: Open Heart Surgery;  Laterality: N/A;  ? ESOPHAGOGASTRODUODENOSCOPY (EGD) WITH PROPOFOL N/A 10/21/2020  ? rehman:Normal hypopharynx.Normal proximal esophagus and mid esophagus.Esophageal mucosal changes consistent with long-segment Barrett's esophagus. (Focal low-grade dysplasia and atypia proximally) z line irregular 37 cm from incisors, 3 cm HH, single gastric polyp (fundic gland) normal duodenal bulb/second portion of duodenum, proximal margin of Barrett's at 34 cm  ? ESOPHAGOGASTRODUODENOSCOPY (EGD) WITH PROPOFOL N/A 02/24/2021  ? Procedure: ESOPHAGOGASTRODUODENOSCOPY (EGD) WITH PROPOFOL;  Surgeon: RRogene Houston MD;  [facility];  Service: Endoscopy;  Laterality: N/A;  1:35  ? ESOPHAGOGASTRODUODENOSCOPY (EGD) WITH PROPOFOL N/A 03/21/2021  ? Procedure: ESOPHAGOGASTRODUODENOSCOPY (EGD) WITH PROPOFOL;  Surgeon: HCarol Ada MD;  Location: [place]  Service: Endoscopy;  Laterality: N/A;  ? EXCISION MORTON'S NEUROMA    ? EYE SURGERY Left   ? retina  ? INCISION AND DRAINAGE OF WOUND Right 06/28/2021  ? Procedure: IRRIGATION AND DEBRIDEMENT WOUND;  Surgeon: PCindra Presume MD;  [place]: [place]  Service: Plastics;  Laterality: Right;  1.5 hour  ? POLYPECTOMY  06/27/2019  ? Procedure: POLYPECTOMY;  Surgeon: RRogene Houston MD;  [facility];  Service: Endoscopy;;  proximal sigmoid colon  ? RIGHT/LEFT  HEART CATH AND CORONARY ANGIOGRAPHY N/A 03/12/2019  ? Procedure: RIGHT/LEFT HEART CATH AND CORONARY ANGIOGRAPHY;  Surgeon: [name], MD;  Location: [place] CV LAB;  Service: Cardiovascular;  Laterality: N/A;  ? SHOULDER ARTHROSCOPY WITH ROTATOR CUFF REPAIR AND SUBACROMIAL DECOMPRESSION Right 08/26/2020  ? Procedure: RIGHT SHOULDER MINI OPEN ROTATOR CUFF REPAIR AND SUBACROMIAL  DECOMPRESSION WITH PATCH GRAFT;  Surgeon: Susa Day, MD;  Location: [facility];  Service: Orthopedics;  Laterality: Right;  90 MINS ?GENERAL WITH BLOCK  ? SKIN SPLIT GRAFT Right 06/28/2021  ? Procedure: SKIN GRAFT SPLIT THICKNESS;  Surgeon: [name], MD;  Location: [place];  Service: Plastics;  Laterality: Right;  ? TEE WITHOUT CARDIOVERSION N/A 03/25/2019  ? Procedure: TRANSESOPHAGEAL ECHOCARDIOGRAM (TEE);  Surgeon: [name], MD;  Location: [place];  Service: Open Heart Surgery;  Laterality: N/A;  ? ?Allergies ? ?Allergies  ?Allergen Reactions  ? Bee Venom Anaphylaxis  ? Atorvastatin   ?  myalgia  ? Diltiazem Itching  ? Rosuvastatin   ?  myalgias  ? Valsartan Itching  ? ? ?History of Present Illness  ?  ?[name] is a 66 y.o. male with a hx of CVA, combined systolic and diastolic heart failure, PAD, esophageal cancer, CAD post CABG, HTN, MI, DM2, CKD 3, PVC last seen 08/25/21. Pleasant gentleman who previously worked as a Forensic psychologist in [employer]. ? ?History of four-vessel CABG October 2020.  Blood pressure control has been difficult.  He has been intolerant to amlodipine, lisinopril, hydrochlorothiazide, beta-blocker due to sensation of fatigue.  He has been started on Lasix by Dr. Moshe Cipro which has improved his lower extremity edema. ? ?At initial appointment he was started on doxazosin.  Follow-up with pharmacist to be started on valsartan.  He then had subsequent orthostatic blood pressures and doxazosin was reduced. ? ?He was evaluated in the ED 12/14/2020 noting dizziness, loss of balance.  He was not orthostatic.  MRI showed small subacute to chronic infarct in left centrum semiovale and no obvious to LVO. He was started in aspirin and recommended for outpatient follow-up with neurology at which time ASA was increased to '325mg'$ . ZIO ordered 12/24/20 due to lightheadedness with predominantly NSR, 3.5% PVC burden, 3 episodes NSVT up to 5 beats and up to 12 runs of SVT.   ? 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 Due to exertional dyspnea myoview performed 02/2021 with infarct with mild peri-infarct ischemia recommended for medical management given CKD. When seen 05/2021 was referred to psychology due to worsening depression in setting of multiple medical issues over the last year.  ? ?He was seen in clinic 08/04/21 noting weakness, depression, and volume up 6 pounds. Torsemide dose increased for 4 days, referred to SW, referred to home health as well as re-referred to psychology. He was seen 08/25/21 with weight up but edema decreased and same Torsemide dose continued.  ? ?Hospitalized 08/29/21-09/01/21 for decompensated heart failure with weight gain of 15 lbs. He was treated with IV diuresis and discharged on Torsemide '40mg'$  QD. Diuresed 10 pounds with discharge weight 203.2 lbs.  ? ?He presents today for follow up. Weight 204 pounds. Notes persistent fatigue and we discussed that this is multifactorial heart failure, deconditioning, depression.  Notes exertional dyspnea stable baseline.  Edema has resolved since hospitalization. Reports no shortness of breath at rest. Reports no chest pain, pressure, or tightness. No  orthopnea, PND. Reports no palpitations.  Switching to [facility] preferred  pharmacy as they do pill packs delivered to his home. He has been followed closely by our SW team for assistance with gas bills, personal care services.  ? ?EKGs/Labs/Other Studies Reviewed:  ? ?The following studies were reviewed today: ? ?Echo 04/26/2018: ?- Left ventricle: The cavity size was normal. There was minimal  ?  concentric hypertrophy. Systolic function was mildly reduced. The  ?  estimated ejection fraction was in the range of 45% to 50%.  ?  Diffuse hypokinesis. Findings consistent with left ventricular  ?  diastolic dysfunction, grade indeterminate. Doppler parameters  ?  are consistent with high ventricular filling pressure.  ?- Mitral valve:  Mildly calcified annulus. There was mild  ?  regurgitation.  ?- Left atrium: The atrium was severely dilated.  ?- Right atrium: The atrium was mildly dilated.  ?- Inferior vena cava: The vessel was dila

## 2021-09-13 NOTE — Telephone Encounter (Signed)
Patient left message - wants to know if Dr Laural Golden can renew Zofran Rx  ? ?Pharmacy: [facility] ? ?Ph: [phone] ?

## 2021-09-14 ENCOUNTER — Ambulatory Visit (INDEPENDENT_AMBULATORY_CARE_PROVIDER_SITE_OTHER): Payer: Medicare HMO | Admitting: Family

## 2021-09-14 ENCOUNTER — Encounter (HOSPITAL_BASED_OUTPATIENT_CLINIC_OR_DEPARTMENT_OTHER): Payer: Self-pay | Admitting: Family

## 2021-09-14 ENCOUNTER — Telehealth: Payer: Self-pay | Admitting: Family Medicine

## 2021-09-14 ENCOUNTER — Telehealth: Payer: Self-pay | Admitting: Licensed Clinical Social Worker

## 2021-09-14 VITALS — BP 124/72 | HR 79 | Ht 70.0 in | Wt 204.6 lb

## 2021-09-14 DIAGNOSIS — I10 Essential (primary) hypertension: Secondary | ICD-10-CM

## 2021-09-14 DIAGNOSIS — I493 Ventricular premature depolarization: Secondary | ICD-10-CM | POA: Diagnosis not present

## 2021-09-14 DIAGNOSIS — F32A Depression, unspecified: Secondary | ICD-10-CM | POA: Diagnosis not present

## 2021-09-14 DIAGNOSIS — I25118 Atherosclerotic heart disease of native coronary artery with other forms of angina pectoris: Secondary | ICD-10-CM

## 2021-09-14 DIAGNOSIS — N184 Chronic kidney disease, stage 4 (severe): Secondary | ICD-10-CM | POA: Diagnosis not present

## 2021-09-14 DIAGNOSIS — E785 Hyperlipidemia, unspecified: Secondary | ICD-10-CM | POA: Diagnosis not present

## 2021-09-14 DIAGNOSIS — I5042 Chronic combined systolic (congestive) and diastolic (congestive) heart failure: Secondary | ICD-10-CM

## 2021-09-14 MED ORDER — NITROGLYCERIN 0.4 MG SL SUBL: 0.4000 mg | SUBLINGUAL_TABLET | SUBLINGUAL | 2 refills | Status: DC | PRN

## 2021-09-14 MED ORDER — TORSEMIDE 40 MG PO TABS
40.0000 mg | ORAL_TABLET | Freq: Every day | ORAL | 1 refills | Status: DC
Start: 2021-09-14 — End: 2021-11-01

## 2021-09-14 MED ORDER — ISOSORBIDE DINITRATE 30 MG PO TABS: 30.0000 mg | ORAL_TABLET | Freq: Three times a day (TID) | ORAL | 1 refills | Status: DC

## 2021-09-14 MED ORDER — ASPIRIN EC 325 MG PO TBEC: 325.0000 mg | DELAYED_RELEASE_TABLET | Freq: Every day | ORAL | 1 refills | Status: DC

## 2021-09-14 MED ORDER — HYDRALAZINE HCL 100 MG PO TABS: 100.0000 mg | ORAL_TABLET | Freq: Three times a day (TID) | ORAL | 1 refills | Status: DC

## 2021-09-14 MED ORDER — CARVEDILOL 12.5 MG PO TABS: 12.5000 mg | ORAL_TABLET | Freq: Two times a day (BID) | ORAL | 3 refills | Status: DC

## 2021-09-14 MED ORDER — DOXAZOSIN MESYLATE 4 MG PO TABS
4.0000 mg | ORAL_TABLET | Freq: Every day | ORAL | 1 refills | Status: DC
Start: 2021-09-14 — End: 2022-03-15

## 2021-09-14 MED ORDER — REPATHA SURECLICK 140 MG/ML ~~LOC~~ SOAJ: 140.0000 mg | SUBCUTANEOUS | 11 refills | Status: DC

## 2021-09-14 NOTE — Telephone Encounter (Signed)
Received signed Patient Assistance Fund request. Did not receive bill or proof of income, I will send f/u reminder via myChart requesting f/u.  ? ?Westley Hummer, MSW, LCSW ?Clinical Social Worker II ?[place] [facility] Navigation  ?[phone]- work cell phone (preferred) ?[phone]- desk phone ? ?

## 2021-09-14 NOTE — Telephone Encounter (Signed)
[name] requesting refill on Hydroxyzine 25 mg. Take one nightly prn allergies/insomnia. Last seen 08/17/21 for pedal edema. Please advise. Thank you  ?

## 2021-09-14 NOTE — Patient Instructions (Signed)
Medication Instructions:  ?Continue your current medications.  ? ?Take your Torsemide '40mg'$  daily  ? ?Take an additional '40mg'$  in the afternoon as needed for weight gain of 2 pounds overnight or 5 pounds in one week ? ? ?*If you need a refill on your cardiac medications before your next appointment, please call your pharmacy* ? ?Lab Work: ?None ordered today.  ? ?Testing/Procedures: ?None ordered today.  ? ?Follow-Up: ?At [facility], you and your health needs are our priority.  As part of our continuing mission to provide you with exceptional heart care, we have created designated Provider Care Teams.  These Care Teams include your primary Cardiologist (physician) and Advanced Practice Providers (APPs -  Physician Assistants and Nurse Practitioners) who all work together to provide you with the care you need, when you need it. ? ?We recommend signing up for the patient portal called "MyChart".  Sign up information is provided on this After Visit Summary.  MyChart is used to connect with patients for Virtual Visits (Telemedicine).  Patients are able to view lab/test results, encounter notes, upcoming appointments, etc.  Non-urgent messages can be sent to your provider as well.   ?To learn more about what you can do with MyChart, go to [email or web address].   ? ?Your next appointment:   ?As scheduled in May with Loel Dubonnet, NP ?AND ?In July with Dr. Oval Linsey ? ?Other Instructions ?  ?Recommend weighing daily and keeping a log. Please call our office if you have weight gain of 2 pounds overnight or 5 pounds in 1 week. This is when you should take an extra Torsemide at lunch.  ? ?Date ? Time Weight  ? ?    ? ?    ? ?    ? ?    ? ?    ? ?    ? ?    ? ?    ?  ?Heart Healthy Diet Recommendations: ?A low-salt diet is recommended. Meats should be grilled, baked, or boiled. Avoid fried foods. Focus on lean protein sources like fish or chicken with vegetables and fruits. The American Heart Association is a Educational psychologist!  American Heart Association Diet and Lifeystyle Recommendations   ?Recommend restricting to less than 2 liters of fluid per day.  ? ?Exercise recommendations: ?The American Heart Association recommends 150 minutes of moderate intensity exercise weekly. ?Try 30 minutes of moderate intensity exercise 4-5 times per week. ?This could include walking, jogging, or swimming. ? ?

## 2021-09-15 ENCOUNTER — Telehealth: Payer: Self-pay | Admitting: Licensed Clinical Social Worker

## 2021-09-15 ENCOUNTER — Other Ambulatory Visit: Payer: Self-pay | Admitting: *Deleted

## 2021-09-15 MED ORDER — HYDROXYZINE PAMOATE 25 MG PO CAPS: ORAL_CAPSULE | ORAL | 1 refills | Status: DC

## 2021-09-15 NOTE — Telephone Encounter (Signed)
2 refills   

## 2021-09-15 NOTE — Telephone Encounter (Signed)
Refills sent in to [facility] ?

## 2021-09-15 NOTE — Telephone Encounter (Signed)
Reminder regarding Medicaid re-certification process was mailed to pt, encouraging him to ensure that an up to date phone number, address and email is registered with DSS so that he receives re-certification paperwork.  ?  ?Westley Hummer, MSW, LCSW ?Clinical Social Worker II ?[place] [facility] Navigation  ?[phone]- work cell phone (preferred) ?[phone]- desk phone ?

## 2021-09-16 ENCOUNTER — Ambulatory Visit (INDEPENDENT_AMBULATORY_CARE_PROVIDER_SITE_OTHER): Payer: Medicare HMO | Admitting: Family Medicine

## 2021-09-16 VITALS — BP 120/86 | HR 78 | Temp 98.1°F | Wt 203.2 lb

## 2021-09-16 DIAGNOSIS — E1169 Type 2 diabetes mellitus with other specified complication: Secondary | ICD-10-CM | POA: Diagnosis not present

## 2021-09-16 DIAGNOSIS — Z7985 Long-term (current) use of injectable non-insulin antidiabetic drugs: Secondary | ICD-10-CM | POA: Diagnosis not present

## 2021-09-16 DIAGNOSIS — Z9842 Cataract extraction status, left eye: Secondary | ICD-10-CM | POA: Diagnosis not present

## 2021-09-16 DIAGNOSIS — E113592 Type 2 diabetes mellitus with proliferative diabetic retinopathy without macular edema, left eye: Secondary | ICD-10-CM | POA: Diagnosis not present

## 2021-09-16 DIAGNOSIS — I5042 Chronic combined systolic (congestive) and diastolic (congestive) heart failure: Secondary | ICD-10-CM

## 2021-09-16 DIAGNOSIS — N184 Chronic kidney disease, stage 4 (severe): Secondary | ICD-10-CM

## 2021-09-16 DIAGNOSIS — E113511 Type 2 diabetes mellitus with proliferative diabetic retinopathy with macular edema, right eye: Secondary | ICD-10-CM | POA: Diagnosis not present

## 2021-09-16 DIAGNOSIS — F321 Major depressive disorder, single episode, moderate: Secondary | ICD-10-CM

## 2021-09-16 DIAGNOSIS — E113522 Type 2 diabetes mellitus with proliferative diabetic retinopathy with traction retinal detachment involving the macula, left eye: Secondary | ICD-10-CM | POA: Diagnosis not present

## 2021-09-16 DIAGNOSIS — E785 Hyperlipidemia, unspecified: Secondary | ICD-10-CM

## 2021-09-16 DIAGNOSIS — Z794 Long term (current) use of insulin: Secondary | ICD-10-CM | POA: Diagnosis not present

## 2021-09-16 DIAGNOSIS — Z961 Presence of intraocular lens: Secondary | ICD-10-CM | POA: Diagnosis not present

## 2021-09-16 DIAGNOSIS — I509 Heart failure, unspecified: Secondary | ICD-10-CM | POA: Diagnosis not present

## 2021-09-16 DIAGNOSIS — Z9841 Cataract extraction status, right eye: Secondary | ICD-10-CM | POA: Diagnosis not present

## 2021-09-16 NOTE — Patient Instructions (Signed)
Hi Niel ?It was good to see you today. ? ?We will work hard on getting the walker approved. ? ?Also please reach out to Dr. Michail Sermon to set up counseling appointment. ? ?We will see you back in 8 weeks. ? ?Please increase the dose of sertraline.  100 mg tablet.  Take 1-1/2 tablet daily so that would be the equivalent of 150 mg daily. ? ?If you need anything sooner let us know [facility] ?

## 2021-09-16 NOTE — Progress Notes (Signed)
? ?  Subjective:  ? ? Patient ID: [name], male    DOB: February 22, 1956, 66 y.o.   MRN: [id number] ? ?HPI ? ?Patient here for hospital follow up. Admitted for 3 nights at [facility] on 08/29/21 and then went ER at [facility] on 09/01/21 for suicidal ideations. ?Patient has ongoing difficulties with combination of his renal disease as well as CHF coronary artery disease and diabetes. ?Patient would like to get an upright walker.  ? ?Needs refill on Nitroglycerin. ? ?Review of Systems ? ?   ?Objective:  ? Physical Exam ? ?Lungs are clear heart regular pulse normal extremities no edema skin warm dry ?Patient will be seeing kidney doctor in the near future will have labs with them there is no point in a strong labs currently ? ?   ?Assessment & Plan:  ?1. Chronic combined systolic and diastolic heart failure (HCC) ?Seemingly stable follow through with cardiology regular basis ? ?2. Hyperlipidemia associated with type 2 diabetes mellitus ([place]) ?Continue medication Repatha tolerating well does not tolerate statins ?Continue Ozempic no low sugar spells currently follow through with endocrinology ?3. CKD (chronic kidney disease) stage 4, GFR 15-29 ml/min (HCC) ?Follow through with kidney doctor on a regular basis stage IV this is discouraging the patient ? ?4. Depression, major, single episode, moderate ([place]) ?Depression-bump up dose of sertraline.  Monitor closely.  Follow-up in 8 weeks sooner problems ? ? ?

## 2021-09-17 ENCOUNTER — Telehealth: Payer: Self-pay | Admitting: Licensed Clinical Social Worker

## 2021-09-17 NOTE — Telephone Encounter (Signed)
LCSW attempted to reach [facility] at [phone]. No answer, assume agency may be closed for calls due to holiday. Voicemail left to f/u on PCS referral.  ? ?I will re-attempt if I do not hear return call by Tuesday 4/11.  ? ?Westley Hummer, MSW, LCSW ?Clinical Social Worker II ?[place] [facility] Navigation  ?[phone]- work cell phone (preferred) ?[phone]- desk phone ? ?

## 2021-09-20 ENCOUNTER — Other Ambulatory Visit: Payer: Self-pay | Admitting: *Deleted

## 2021-09-20 ENCOUNTER — Telehealth: Payer: Self-pay | Admitting: Neurology

## 2021-09-20 MED ORDER — SERTRALINE HCL 100 MG PO TABS: ORAL_TABLET | ORAL | 5 refills | Status: DC

## 2021-09-20 NOTE — Telephone Encounter (Signed)
LVM and sent mychart msg informing pt of r/s needed for 4/18 appointment- MD out. ?

## 2021-09-20 NOTE — Progress Notes (Signed)
New prescription sent in to pharmacy ?

## 2021-09-20 NOTE — Addendum Note (Signed)
Addended by: Madelin Rear on: 09/20/2021 09:53 AM ? ? Modules accepted: Orders ? ?

## 2021-09-22 DIAGNOSIS — I5033 Acute on chronic diastolic (congestive) heart failure: Secondary | ICD-10-CM

## 2021-09-22 DIAGNOSIS — I130 Hypertensive heart and chronic kidney disease with heart failure and stage 1 through stage 4 chronic kidney disease, or unspecified chronic kidney disease: Secondary | ICD-10-CM

## 2021-09-22 DIAGNOSIS — E1122 Type 2 diabetes mellitus with diabetic chronic kidney disease: Secondary | ICD-10-CM

## 2021-09-22 DIAGNOSIS — I5022 Chronic systolic (congestive) heart failure: Secondary | ICD-10-CM

## 2021-09-24 ENCOUNTER — Telehealth: Payer: Self-pay | Admitting: Licensed Clinical Social Worker

## 2021-09-24 NOTE — Telephone Encounter (Signed)
LCSW called [employer] to f/u on PCS forms mailed. Spoke with Jermaine at [phone] w/ Customer Service. They confirmed they have processed PCS forms and pt is ready to be scheduled for assessment for services- I confirmed pt phone number for them. I will send f/u message to pt to let him know it was received and provide number again in case he does not hear back from [facility]/they are not able to make contact pt can schedule that f/u. I remain available.  ? ?Westley Hummer, MSW, LCSW ?Clinical Social Worker II ?[place] [facility]/Vascular Care Navigation  ?[phone]- work cell phone (preferred) ?[phone]- desk phone ? ?

## 2021-09-27 DIAGNOSIS — C159 Malignant neoplasm of esophagus, unspecified: Secondary | ICD-10-CM | POA: Diagnosis not present

## 2021-09-27 DIAGNOSIS — K317 Polyp of stomach and duodenum: Secondary | ICD-10-CM | POA: Diagnosis not present

## 2021-09-28 ENCOUNTER — Other Ambulatory Visit: Payer: Self-pay

## 2021-09-28 ENCOUNTER — Ambulatory Visit: Payer: Medicare HMO | Admitting: Neurology

## 2021-09-28 ENCOUNTER — Emergency Department (HOSPITAL_COMMUNITY)
Admission: EM | Admit: 2021-09-28 | Discharge: 2021-09-28 | Disposition: A | Discharge status: Other Acute Inpatient Hospital | Payer: Medicare HMO | Attending: Emergency Medicine | Admitting: Emergency Medicine

## 2021-09-28 ENCOUNTER — Emergency Department (HOSPITAL_COMMUNITY): Payer: Medicare HMO

## 2021-09-28 ENCOUNTER — Encounter (HOSPITAL_COMMUNITY): Payer: Self-pay

## 2021-09-28 DIAGNOSIS — R4182 Altered mental status, unspecified: Secondary | ICD-10-CM | POA: Diagnosis not present

## 2021-09-28 DIAGNOSIS — E1122 Type 2 diabetes mellitus with diabetic chronic kidney disease: Secondary | ICD-10-CM | POA: Diagnosis not present

## 2021-09-28 DIAGNOSIS — Z9889 Other specified postprocedural states: Secondary | ICD-10-CM | POA: Diagnosis not present

## 2021-09-28 DIAGNOSIS — Z794 Long term (current) use of insulin: Secondary | ICD-10-CM | POA: Insufficient documentation

## 2021-09-28 DIAGNOSIS — I130 Hypertensive heart and chronic kidney disease with heart failure and stage 1 through stage 4 chronic kidney disease, or unspecified chronic kidney disease: Secondary | ICD-10-CM | POA: Diagnosis not present

## 2021-09-28 DIAGNOSIS — D649 Anemia, unspecified: Secondary | ICD-10-CM | POA: Diagnosis not present

## 2021-09-28 DIAGNOSIS — R7989 Other specified abnormal findings of blood chemistry: Secondary | ICD-10-CM | POA: Diagnosis not present

## 2021-09-28 DIAGNOSIS — Z7982 Long term (current) use of aspirin: Secondary | ICD-10-CM | POA: Diagnosis not present

## 2021-09-28 DIAGNOSIS — Z8719 Personal history of other diseases of the digestive system: Secondary | ICD-10-CM | POA: Diagnosis not present

## 2021-09-28 DIAGNOSIS — E1121 Type 2 diabetes mellitus with diabetic nephropathy: Secondary | ICD-10-CM | POA: Diagnosis not present

## 2021-09-28 DIAGNOSIS — K920 Hematemesis: Secondary | ICD-10-CM | POA: Diagnosis not present

## 2021-09-28 DIAGNOSIS — J90 Pleural effusion, not elsewhere classified: Secondary | ICD-10-CM | POA: Diagnosis not present

## 2021-09-28 DIAGNOSIS — R600 Localized edema: Secondary | ICD-10-CM | POA: Diagnosis not present

## 2021-09-28 DIAGNOSIS — K2270 Barrett's esophagus without dysplasia: Secondary | ICD-10-CM | POA: Diagnosis not present

## 2021-09-28 DIAGNOSIS — R059 Cough, unspecified: Secondary | ICD-10-CM | POA: Diagnosis not present

## 2021-09-28 DIAGNOSIS — K91840 Postprocedural hemorrhage and hematoma of a digestive system organ or structure following a digestive system procedure: Secondary | ICD-10-CM | POA: Diagnosis not present

## 2021-09-28 DIAGNOSIS — N183 Chronic kidney disease, stage 3 unspecified: Secondary | ICD-10-CM | POA: Diagnosis not present

## 2021-09-28 DIAGNOSIS — I2510 Atherosclerotic heart disease of native coronary artery without angina pectoris: Secondary | ICD-10-CM | POA: Diagnosis not present

## 2021-09-28 DIAGNOSIS — Z20822 Contact with and (suspected) exposure to covid-19: Secondary | ICD-10-CM | POA: Insufficient documentation

## 2021-09-28 DIAGNOSIS — I129 Hypertensive chronic kidney disease with stage 1 through stage 4 chronic kidney disease, or unspecified chronic kidney disease: Secondary | ICD-10-CM | POA: Diagnosis not present

## 2021-09-28 DIAGNOSIS — Z09 Encounter for follow-up examination after completed treatment for conditions other than malignant neoplasm: Secondary | ICD-10-CM | POA: Diagnosis not present

## 2021-09-28 DIAGNOSIS — C159 Malignant neoplasm of esophagus, unspecified: Secondary | ICD-10-CM | POA: Diagnosis not present

## 2021-09-28 DIAGNOSIS — R079 Chest pain, unspecified: Secondary | ICD-10-CM | POA: Diagnosis not present

## 2021-09-28 DIAGNOSIS — I5033 Acute on chronic diastolic (congestive) heart failure: Secondary | ICD-10-CM | POA: Diagnosis not present

## 2021-09-28 DIAGNOSIS — Z8501 Personal history of malignant neoplasm of esophagus: Secondary | ICD-10-CM | POA: Diagnosis not present

## 2021-09-28 DIAGNOSIS — D688 Other specified coagulation defects: Secondary | ICD-10-CM | POA: Diagnosis not present

## 2021-09-28 DIAGNOSIS — R918 Other nonspecific abnormal finding of lung field: Secondary | ICD-10-CM | POA: Diagnosis not present

## 2021-09-28 DIAGNOSIS — R9431 Abnormal electrocardiogram [ECG] [EKG]: Secondary | ICD-10-CM | POA: Diagnosis not present

## 2021-09-28 DIAGNOSIS — R000 Tachycardia, unspecified: Secondary | ICD-10-CM | POA: Insufficient documentation

## 2021-09-28 DIAGNOSIS — R400 Somnolence: Secondary | ICD-10-CM | POA: Diagnosis not present

## 2021-09-28 DIAGNOSIS — R0602 Shortness of breath: Secondary | ICD-10-CM | POA: Diagnosis not present

## 2021-09-28 DIAGNOSIS — R0600 Dyspnea, unspecified: Secondary | ICD-10-CM | POA: Diagnosis not present

## 2021-09-28 DIAGNOSIS — I517 Cardiomegaly: Secondary | ICD-10-CM | POA: Diagnosis not present

## 2021-09-28 DIAGNOSIS — K9189 Other postprocedural complications and disorders of digestive system: Secondary | ICD-10-CM | POA: Diagnosis not present

## 2021-09-28 DIAGNOSIS — R0902 Hypoxemia: Secondary | ICD-10-CM | POA: Diagnosis not present

## 2021-09-28 DIAGNOSIS — N189 Chronic kidney disease, unspecified: Secondary | ICD-10-CM | POA: Diagnosis not present

## 2021-09-28 LAB — BLOOD GAS, VENOUS
Acid-base deficit: 5.3 mmol/L — ABNORMAL HIGH (ref 0.0–2.0)
Bicarbonate: 20.6 mmol/L (ref 20.0–28.0)
Drawn by: 6352
FIO2: 28 %
O2 Saturation: 25.8 %
Patient temperature: 36.4
pCO2, Ven: 40 mmHg — ABNORMAL LOW (ref 44–60)
pH, Ven: 7.32 (ref 7.25–7.43)
pO2, Ven: <31 mmHg — CL (ref 32–45)

## 2021-09-28 LAB — COMPREHENSIVE METABOLIC PANEL
ALT: 25 U/L (ref 0–44)
AST: 25 U/L (ref 15–41)
Albumin: 3.9 g/dL (ref 3.5–5.0)
Alkaline Phosphatase: 96 U/L (ref 38–126)
Anion gap: 13 (ref 5–15)
BUN: 70 mg/dL — ABNORMAL HIGH (ref 8–23)
CO2: 20 mmol/L — ABNORMAL LOW (ref 22–32)
Calcium: 9.1 mg/dL (ref 8.9–10.3)
Chloride: 105 mmol/L (ref 98–111)
Creatinine, Ser: 3.76 mg/dL — ABNORMAL HIGH (ref 0.61–1.24)
GFR, Estimated: 17 mL/min — ABNORMAL LOW (ref >60)
Glucose, Bld: 272 mg/dL — ABNORMAL HIGH (ref 70–99)
Potassium: 4.6 mmol/L (ref 3.5–5.1)
Sodium: 138 mmol/L (ref 135–145)
Total Bilirubin: 0.9 mg/dL (ref 0.3–1.2)
Total Protein: 7.1 g/dL (ref 6.5–8.1)

## 2021-09-28 LAB — TYPE AND SCREEN
ABO/RH(D): B POS
Antibody Screen: NEGATIVE

## 2021-09-28 LAB — PROTIME-INR
INR: 1.3 — ABNORMAL HIGH (ref 0.8–1.2)
Prothrombin Time: 16.3 seconds — ABNORMAL HIGH (ref 11.4–15.2)

## 2021-09-28 LAB — CBC
HCT: 31 % — ABNORMAL LOW (ref 39.0–52.0)
HCT: 28.2 % — ABNORMAL LOW (ref 39.0–52.0)
Hemoglobin: 9.7 g/dL — ABNORMAL LOW (ref 13.0–17.0)
Hemoglobin: 8.8 g/dL — ABNORMAL LOW (ref 13.0–17.0)
MCH: 26 pg (ref 26.0–34.0)
MCH: 26.2 pg (ref 26.0–34.0)
MCHC: 31.3 g/dL (ref 30.0–36.0)
MCHC: 31.2 g/dL (ref 30.0–36.0)
MCV: 83.1 fL (ref 80.0–100.0)
MCV: 83.9 fL (ref 80.0–100.0)
Platelets: 251 10*3/uL (ref 150–400)
Platelets: 220 10*3/uL (ref 150–400)
RBC: 3.73 MIL/uL — ABNORMAL LOW (ref 4.22–5.81)
RBC: 3.36 MIL/uL — ABNORMAL LOW (ref 4.22–5.81)
RDW: 16.7 % — ABNORMAL HIGH (ref 11.5–15.5)
RDW: 17 % — ABNORMAL HIGH (ref 11.5–15.5)
WBC: 12.2 10*3/uL — ABNORMAL HIGH (ref 4.0–10.5)
WBC: 12.6 10*3/uL — ABNORMAL HIGH (ref 4.0–10.5)
nRBC: 0 % (ref 0.0–0.2)
nRBC: 0 % (ref 0.0–0.2)

## 2021-09-28 LAB — BRAIN NATRIURETIC PEPTIDE: B Natriuretic Peptide: >4500 pg/mL — ABNORMAL HIGH (ref 0.0–100.0)

## 2021-09-28 LAB — TROPONIN I (HIGH SENSITIVITY)
Troponin I (High Sensitivity): 34 ng/L — ABNORMAL HIGH (ref <18)
Troponin I (High Sensitivity): 34 ng/L — ABNORMAL HIGH (ref <18)

## 2021-09-28 LAB — APTT: aPTT: 33 seconds (ref 24–36)

## 2021-09-28 LAB — RESP PANEL BY RT-PCR (FLU A&B, COVID) ARPGX2
Influenza A by PCR: NEGATIVE
Influenza B by PCR: NEGATIVE
SARS Coronavirus 2 by RT PCR: NEGATIVE

## 2021-09-28 MED ORDER — ONDANSETRON HCL 4 MG/2ML IJ SOLN: 4.0000 mg | Freq: Once | INTRAMUSCULAR | Status: AC

## 2021-09-28 MED ORDER — PROCHLORPERAZINE EDISYLATE 10 MG/2ML IJ SOLN
10.0000 mg | Freq: Once | INTRAMUSCULAR | Status: AC
Start: 2021-09-28 — End: 2021-09-28
  Administered 2021-09-28: 10 mg via INTRAVENOUS
  Filled 2021-09-28: qty 2

## 2021-09-28 MED ORDER — LABETALOL HCL 5 MG/ML IV SOLN
10.0000 mg | Freq: Once | INTRAVENOUS | Status: AC
  Administered 2021-09-28: 10 mg via INTRAVENOUS
  Filled 2021-09-28: qty 4

## 2021-09-28 MED ORDER — ONDANSETRON HCL 4 MG/2ML IJ SOLN: 4.0000 mg | Freq: Once | INTRAMUSCULAR | Status: DC

## 2021-09-28 MED ORDER — NITROGLYCERIN 0.4 MG SL SUBL
0.4000 mg | SUBLINGUAL_TABLET | Freq: Once | SUBLINGUAL | Status: AC
  Administered 2021-09-28: 0.4 mg via SUBLINGUAL
  Filled 2021-09-28: qty 1

## 2021-09-28 MED ORDER — ONDANSETRON HCL 4 MG/2ML IJ SOLN
INTRAMUSCULAR | Status: AC
  Administered 2021-09-28: 4 mg via INTRAVENOUS
  Filled 2021-09-28: qty 2

## 2021-09-28 MED ORDER — NITROGLYCERIN 2 % TD OINT
1.0000 [in_us] | TOPICAL_OINTMENT | Freq: Once | TRANSDERMAL | Status: AC
  Administered 2021-09-28: 1 [in_us] via TOPICAL
  Filled 2021-09-28: qty 1

## 2021-09-28 MED ORDER — ONDANSETRON HCL 4 MG/2ML IJ SOLN
4.0000 mg | Freq: Once | INTRAMUSCULAR | Status: AC
  Administered 2021-09-28: 4 mg via INTRAVENOUS
  Filled 2021-09-28: qty 2

## 2021-09-28 MED ORDER — HYDRALAZINE HCL 20 MG/ML IJ SOLN
10.0000 mg | Freq: Once | INTRAMUSCULAR | Status: AC
  Administered 2021-09-28: 10 mg via INTRAVENOUS
  Filled 2021-09-28: qty 1

## 2021-09-28 MED ORDER — PANTOPRAZOLE 80MG IVPB - SIMPLE MED
80.0000 mg | Freq: Once | INTRAVENOUS | Status: AC
  Administered 2021-09-28: 80 mg via INTRAVENOUS
  Filled 2021-09-28: qty 100

## 2021-09-28 MED ORDER — FUROSEMIDE 10 MG/ML IJ SOLN
80.0000 mg | Freq: Once | INTRAMUSCULAR | Status: AC
  Administered 2021-09-28: 80 mg via INTRAVENOUS
  Filled 2021-09-28: qty 8

## 2021-09-28 NOTE — ED Notes (Signed)
Spoke with Dr zammit about pt continued high blood pressure.  No new orders given.   ?

## 2021-09-28 NOTE — ED Notes (Signed)
Pt started vomiting.  

## 2021-09-28 NOTE — ED Notes (Signed)
Pt reports chest pain.  Spoke with dr zammit and new order for nitro to be given ?

## 2021-09-28 NOTE — ED Notes (Signed)
EDP informed of continued high bp ?
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
## 2021-09-28 NOTE — ED Notes (Signed)
Beeped [employer] for GI ?

## 2021-09-28 NOTE — ED Triage Notes (Signed)
Patient with complaints of vomiting blood after having Upper GI with ablation the previous day at [facility].  ?

## 2021-09-28 NOTE — ED Notes (Signed)
Transport at bedside [facility]) ?

## 2021-09-30 ENCOUNTER — Telehealth (HOSPITAL_COMMUNITY): Payer: Medicare HMO | Admitting: Psychiatry

## 2021-10-07 ENCOUNTER — Ambulatory Visit (INDEPENDENT_AMBULATORY_CARE_PROVIDER_SITE_OTHER): Payer: Medicare HMO | Admitting: Family Medicine

## 2021-10-07 ENCOUNTER — Encounter: Payer: Self-pay | Admitting: Family Medicine

## 2021-10-07 VITALS — BP 128/62 | HR 75 | Temp 97.7°F | Wt 193.8 lb

## 2021-10-07 DIAGNOSIS — I10 Essential (primary) hypertension: Secondary | ICD-10-CM

## 2021-10-07 DIAGNOSIS — D649 Anemia, unspecified: Secondary | ICD-10-CM

## 2021-10-07 DIAGNOSIS — I5042 Chronic combined systolic (congestive) and diastolic (congestive) heart failure: Secondary | ICD-10-CM

## 2021-10-07 DIAGNOSIS — N184 Chronic kidney disease, stage 4 (severe): Secondary | ICD-10-CM | POA: Diagnosis not present

## 2021-10-07 NOTE — Progress Notes (Signed)
27 ? ?Subjective:  ? ? Patient ID: [name], male    DOB: 1955/12/30, 66 y.o.   MRN: [id number] ? ?HPI ?Pt admitted to [facility] on 09/28/21-10/04/21. Pt began to bleed after EGD. Caregiver states that pt began to vomit blood after procedure; states this happens each time. Hospital wanted pt to stop taking Tramadol but not sure why. Pt is taking Carafate 3-4 times per day before eating.  ? ?Unfortunately this patient has been feeling fatigued tired feeling rundown.  He is able to fix basic food.  He is able to move around with the use of a cane and a walker ?He is requesting upright walker ?He is also requesting a wider shower seat ? ? ?Review of Systems ? ?   ?Objective:  ? Physical Exam ?Lungs are clear hearts regular extremities with some edema skin warm dry blood pressure reasonable ? ?This patient would benefit from a walker that is more easier for him to use a stand-up walker would be useful ?In addition to this a wider shower seat would be reasonable to help minimize risk of falling in the shower ? ?   ?Assessment & Plan:  ?Esophageal cancer ?Being treated at [facility] ?Recent hematemesis ?Needs home health equipment ?Has home physical therapy occupational therapy ?Family will let us know what the new medications are ?Patient will do lab work in the near future if it does not have CBC and CMP he will have that done ?Patient having a very difficult time getting to other doctors blood pressure under good control we will connect with Dr. [name] office to let them know that he will be doing his follow-up regarding blood pressure here ? ?Check 4 to 6 weeks ? ?

## 2021-10-08 LAB — CBC WITH DIFFERENTIAL/PLATELET
Basophils Absolute: 0.1 10*3/uL (ref 0.0–0.2)
Basos: 1 %
EOS (ABSOLUTE): 0.2 10*3/uL (ref 0.0–0.4)
Eos: 3 %
Hematocrit: 25.8 % — ABNORMAL LOW (ref 37.5–51.0)
Hemoglobin: 8.1 g/dL — ABNORMAL LOW (ref 13.0–17.7)
Immature Grans (Abs): 0 10*3/uL (ref 0.0–0.1)
Immature Granulocytes: 1 %
Lymphocytes Absolute: 0.9 10*3/uL (ref 0.7–3.1)
Lymphs: 11 %
MCH: 26.1 pg — ABNORMAL LOW (ref 26.6–33.0)
MCHC: 31.4 g/dL — ABNORMAL LOW (ref 31.5–35.7)
MCV: 83 fL (ref 79–97)
Monocytes Absolute: 0.8 10*3/uL (ref 0.1–0.9)
Monocytes: 9 %
Neutrophils Absolute: 6.1 10*3/uL (ref 1.4–7.0)
Neutrophils: 75 %
Platelets: 320 10*3/uL (ref 150–450)
RBC: 3.1 x10E6/uL — ABNORMAL LOW (ref 4.14–5.80)
RDW: 16.8 % — ABNORMAL HIGH (ref 11.6–15.4)
WBC: 8.1 10*3/uL (ref 3.4–10.8)

## 2021-10-08 LAB — COMPREHENSIVE METABOLIC PANEL
ALT: 9 IU/L (ref 0–44)
AST: 12 IU/L (ref 0–40)
Albumin/Globulin Ratio: 1.8 (ref 1.2–2.2)
Albumin: 3.7 g/dL — ABNORMAL LOW (ref 3.8–4.8)
Alkaline Phosphatase: 87 IU/L (ref 44–121)
BUN/Creatinine Ratio: 14 (ref 10–24)
BUN: 42 mg/dL — ABNORMAL HIGH (ref 8–27)
Bilirubin Total: 0.2 mg/dL (ref 0.0–1.2)
CO2: 21 mmol/L (ref 20–29)
Calcium: 8.5 mg/dL — ABNORMAL LOW (ref 8.6–10.2)
Chloride: 103 mmol/L (ref 96–106)
Creatinine, Ser: 3.03 mg/dL — ABNORMAL HIGH (ref 0.76–1.27)
Globulin, Total: 2.1 g/dL (ref 1.5–4.5)
Glucose: 213 mg/dL — ABNORMAL HIGH (ref 70–99)
Potassium: 4.4 mmol/L (ref 3.5–5.2)
Sodium: 138 mmol/L (ref 134–144)
Total Protein: 5.8 g/dL — ABNORMAL LOW (ref 6.0–8.5)
eGFR: 22 mL/min/{1.73_m2} — ABNORMAL LOW (ref >59)

## 2021-10-08 NOTE — Addendum Note (Signed)
Addended by: Dairl Ponder on: 10/08/2021 01:43 PM ? ? Modules accepted: Orders ? ?

## 2021-10-11 ENCOUNTER — Other Ambulatory Visit (INDEPENDENT_AMBULATORY_CARE_PROVIDER_SITE_OTHER): Payer: Self-pay | Admitting: Gastroenterology

## 2021-10-11 NOTE — Telephone Encounter (Signed)
Last seen 02/09/2021. ?

## 2021-10-12 NOTE — Progress Notes (Signed)
Telephone call no answer (10/12/21) ?

## 2021-10-13 ENCOUNTER — Telehealth: Payer: Self-pay | Admitting: Family Medicine

## 2021-10-13 NOTE — Telephone Encounter (Signed)
Left detailed message on Michelle voicemail.  

## 2021-10-13 NOTE — Telephone Encounter (Signed)
[name] (OT [place]) calling in to request verbal orders for Home Health/OT one time a week for 4 weeks. [name] did complete Home Health OT eval today. Please advise. Thank you ?[phone] ?

## 2021-10-13 NOTE — Telephone Encounter (Signed)
Plz do ?

## 2021-10-14 ENCOUNTER — Telehealth: Payer: Self-pay | Admitting: *Deleted

## 2021-10-14 NOTE — Telephone Encounter (Signed)
[name] from [place] called and said she seen the patient today. Patient reported to her that his legs gave out on him last night going up the stairs and he fell.  She reports no injury and the patient was able to get himself back up.  Also reports patient's blood pressure was up at 162/94, he denies any headache or trouble with vision.  Any questions please feel free to contact her at [phone] ?

## 2021-10-14 NOTE — Telephone Encounter (Signed)
Recommend monitoring blood pressure periodically if it continues to stay out we will need to do adjustment patient does have follow-up visit later in May with [place] ?

## 2021-10-14 NOTE — Telephone Encounter (Signed)
Tried to call pt and it says unable to complete call.  Spoke with Claiborne Billings ([place]) and she says that she has recommended to pt that he monitor his blood pressure and keep a log of the readings. She said he told her that he would. She also says pt's phone is messed up. He is not able to call anyone or accept calls but can text.  She says she has another visit with him next week and will let us know if any changes in his blood pressure. ?

## 2021-10-19 ENCOUNTER — Telehealth: Payer: Self-pay | Admitting: Cardiovascular Disease

## 2021-10-19 NOTE — Telephone Encounter (Signed)
Pt c/o BP issue: STAT if pt c/o blurred vision, one-sided weakness or slurred speech ? ?1. What are your last 5 BP readings?  ?10/19/21 180/108, 10 mins later 174/102, 10 mins later 172/102 ? ?2. Are you having any other symptoms (ex. Dizziness, headache, blurred vision, passed out)? Blurred vision and leg swelling ? ?3. What is your BP issue? [name] is calling from [place] stating she just left the patient's home and got these BP readings today. States the patient complained of having new blurred vision as well as swelling in his legs. Patient advised her he has been taking torsemide 3-4x a day to maintain swelling. Weight is stable from what they have seen, pt does not keep log of daily weights. This is [name]'s second time seeing the patient and he reported he has fallen 3x since last Wednesday stating his legs just collapse. [name] reports she encouraged the patient to keep a log of BP. Patient's phone is not working properly so he is unable to receive calls, but can call out.   ?

## 2021-10-19 NOTE — Telephone Encounter (Signed)
Spoke with Claiborne Billings who stated patient had not had 2 nd dose of medications  ?She advised to take Hydralazine and check blood pressure in couple of hours ?Advised if no improvement patient needs to go to ED for evaluation.  ? ?Patient not keeping log of blood pressures or weights which Claiborne Billings advised patient to start doing.  ?No recent labs done on patient and Claiborne Billings says taking Torsemide several a day but she says patient not very clear on answers.  ?Weight about same as last week  ? ?Unable to call patient secondary to phone not working but Claiborne Billings is going to text patient. She will advise to go to ED if blood pressure still elevated if not to call the office in the morning  ? ? ? ? ?

## 2021-10-20 ENCOUNTER — Emergency Department (HOSPITAL_BASED_OUTPATIENT_CLINIC_OR_DEPARTMENT_OTHER): Payer: Medicare HMO | Admitting: Radiology

## 2021-10-20 ENCOUNTER — Other Ambulatory Visit: Payer: Self-pay

## 2021-10-20 ENCOUNTER — Inpatient Hospital Stay (HOSPITAL_BASED_OUTPATIENT_CLINIC_OR_DEPARTMENT_OTHER)
Admission: EM | Admit: 2021-10-20 | Discharge: 2021-10-28 | DRG: 291 | Disposition: A | Discharge status: Skilled Nursing Facility | Payer: Medicare HMO | Attending: Internal Medicine | Admitting: Internal Medicine

## 2021-10-20 ENCOUNTER — Encounter (HOSPITAL_BASED_OUTPATIENT_CLINIC_OR_DEPARTMENT_OTHER): Payer: Self-pay | Admitting: Emergency Medicine

## 2021-10-20 DIAGNOSIS — Z8249 Family history of ischemic heart disease and other diseases of the circulatory system: Secondary | ICD-10-CM

## 2021-10-20 DIAGNOSIS — K2211 Ulcer of esophagus with bleeding: Secondary | ICD-10-CM | POA: Diagnosis present

## 2021-10-20 DIAGNOSIS — I5033 Acute on chronic diastolic (congestive) heart failure: Secondary | ICD-10-CM | POA: Diagnosis not present

## 2021-10-20 DIAGNOSIS — H538 Other visual disturbances: Secondary | ICD-10-CM

## 2021-10-20 DIAGNOSIS — N1831 Chronic kidney disease, stage 3a: Secondary | ICD-10-CM

## 2021-10-20 DIAGNOSIS — D62 Acute posthemorrhagic anemia: Secondary | ICD-10-CM | POA: Diagnosis not present

## 2021-10-20 DIAGNOSIS — Z7982 Long term (current) use of aspirin: Secondary | ICD-10-CM

## 2021-10-20 DIAGNOSIS — I272 Pulmonary hypertension, unspecified: Secondary | ICD-10-CM | POA: Diagnosis not present

## 2021-10-20 DIAGNOSIS — I2510 Atherosclerotic heart disease of native coronary artery without angina pectoris: Secondary | ICD-10-CM | POA: Diagnosis present

## 2021-10-20 DIAGNOSIS — H4312 Vitreous hemorrhage, left eye: Secondary | ICD-10-CM | POA: Diagnosis not present

## 2021-10-20 DIAGNOSIS — N185 Chronic kidney disease, stage 5: Secondary | ICD-10-CM | POA: Diagnosis not present

## 2021-10-20 DIAGNOSIS — M6281 Muscle weakness (generalized): Secondary | ICD-10-CM | POA: Diagnosis not present

## 2021-10-20 DIAGNOSIS — Z91030 Bee allergy status: Secondary | ICD-10-CM | POA: Diagnosis not present

## 2021-10-20 DIAGNOSIS — T798XXS Other early complications of trauma, sequela: Secondary | ICD-10-CM | POA: Diagnosis not present

## 2021-10-20 DIAGNOSIS — I5043 Acute on chronic combined systolic (congestive) and diastolic (congestive) heart failure: Secondary | ICD-10-CM | POA: Diagnosis present

## 2021-10-20 DIAGNOSIS — E1159 Type 2 diabetes mellitus with other circulatory complications: Secondary | ICD-10-CM | POA: Diagnosis not present

## 2021-10-20 DIAGNOSIS — E1165 Type 2 diabetes mellitus with hyperglycemia: Secondary | ICD-10-CM

## 2021-10-20 DIAGNOSIS — I5042 Chronic combined systolic (congestive) and diastolic (congestive) heart failure: Secondary | ICD-10-CM | POA: Diagnosis present

## 2021-10-20 DIAGNOSIS — Z7901 Long term (current) use of anticoagulants: Secondary | ICD-10-CM | POA: Diagnosis not present

## 2021-10-20 DIAGNOSIS — E1122 Type 2 diabetes mellitus with diabetic chronic kidney disease: Secondary | ICD-10-CM | POA: Diagnosis not present

## 2021-10-20 DIAGNOSIS — I130 Hypertensive heart and chronic kidney disease with heart failure and stage 1 through stage 4 chronic kidney disease, or unspecified chronic kidney disease: Principal | ICD-10-CM | POA: Diagnosis present

## 2021-10-20 DIAGNOSIS — D638 Anemia in other chronic diseases classified elsewhere: Secondary | ICD-10-CM | POA: Diagnosis not present

## 2021-10-20 DIAGNOSIS — I25708 Atherosclerosis of coronary artery bypass graft(s), unspecified, with other forms of angina pectoris: Secondary | ICD-10-CM | POA: Diagnosis not present

## 2021-10-20 DIAGNOSIS — M19011 Primary osteoarthritis, right shoulder: Secondary | ICD-10-CM | POA: Diagnosis present

## 2021-10-20 DIAGNOSIS — Z87891 Personal history of nicotine dependence: Secondary | ICD-10-CM

## 2021-10-20 DIAGNOSIS — Z951 Presence of aortocoronary bypass graft: Secondary | ICD-10-CM

## 2021-10-20 DIAGNOSIS — Z800 Family history of malignant neoplasm of digestive organs: Secondary | ICD-10-CM

## 2021-10-20 DIAGNOSIS — X088XXS Exposure to other specified smoke, fire and flames, sequela: Secondary | ICD-10-CM | POA: Diagnosis present

## 2021-10-20 DIAGNOSIS — R5383 Other fatigue: Secondary | ICD-10-CM | POA: Diagnosis not present

## 2021-10-20 DIAGNOSIS — K317 Polyp of stomach and duodenum: Secondary | ICD-10-CM | POA: Diagnosis present

## 2021-10-20 DIAGNOSIS — R5381 Other malaise: Secondary | ICD-10-CM | POA: Diagnosis not present

## 2021-10-20 DIAGNOSIS — K222 Esophageal obstruction: Secondary | ICD-10-CM | POA: Diagnosis present

## 2021-10-20 DIAGNOSIS — I509 Heart failure, unspecified: Secondary | ICD-10-CM

## 2021-10-20 DIAGNOSIS — N184 Chronic kidney disease, stage 4 (severe): Secondary | ICD-10-CM | POA: Diagnosis present

## 2021-10-20 DIAGNOSIS — T24001S Burn of unspecified degree of unspecified site of right lower limb, except ankle and foot, sequela: Secondary | ICD-10-CM | POA: Diagnosis not present

## 2021-10-20 DIAGNOSIS — M19012 Primary osteoarthritis, left shoulder: Secondary | ICD-10-CM | POA: Diagnosis present

## 2021-10-20 DIAGNOSIS — I5022 Chronic systolic (congestive) heart failure: Secondary | ICD-10-CM | POA: Diagnosis not present

## 2021-10-20 DIAGNOSIS — R0602 Shortness of breath: Secondary | ICD-10-CM | POA: Diagnosis not present

## 2021-10-20 DIAGNOSIS — Z8501 Personal history of malignant neoplasm of esophagus: Secondary | ICD-10-CM

## 2021-10-20 DIAGNOSIS — Z794 Long term (current) use of insulin: Secondary | ICD-10-CM

## 2021-10-20 DIAGNOSIS — Z79899 Other long term (current) drug therapy: Secondary | ICD-10-CM

## 2021-10-20 DIAGNOSIS — D509 Iron deficiency anemia, unspecified: Secondary | ICD-10-CM | POA: Diagnosis present

## 2021-10-20 DIAGNOSIS — T24131D Burn of first degree of right lower leg, subsequent encounter: Secondary | ICD-10-CM | POA: Diagnosis not present

## 2021-10-20 DIAGNOSIS — Z833 Family history of diabetes mellitus: Secondary | ICD-10-CM

## 2021-10-20 DIAGNOSIS — D631 Anemia in chronic kidney disease: Secondary | ICD-10-CM | POA: Diagnosis present

## 2021-10-20 DIAGNOSIS — K921 Melena: Secondary | ICD-10-CM | POA: Diagnosis not present

## 2021-10-20 DIAGNOSIS — S50312A Abrasion of left elbow, initial encounter: Secondary | ICD-10-CM | POA: Diagnosis present

## 2021-10-20 DIAGNOSIS — M160 Bilateral primary osteoarthritis of hip: Secondary | ICD-10-CM | POA: Diagnosis present

## 2021-10-20 DIAGNOSIS — I110 Hypertensive heart disease with heart failure: Secondary | ICD-10-CM | POA: Diagnosis not present

## 2021-10-20 DIAGNOSIS — I10 Essential (primary) hypertension: Secondary | ICD-10-CM | POA: Diagnosis present

## 2021-10-20 DIAGNOSIS — Z7985 Long-term (current) use of injectable non-insulin antidiabetic drugs: Secondary | ICD-10-CM | POA: Diagnosis not present

## 2021-10-20 DIAGNOSIS — I5023 Acute on chronic systolic (congestive) heart failure: Secondary | ICD-10-CM | POA: Diagnosis not present

## 2021-10-20 DIAGNOSIS — R531 Weakness: Secondary | ICD-10-CM | POA: Diagnosis present

## 2021-10-20 DIAGNOSIS — R519 Headache, unspecified: Secondary | ICD-10-CM | POA: Diagnosis not present

## 2021-10-20 DIAGNOSIS — E119 Type 2 diabetes mellitus without complications: Secondary | ICD-10-CM | POA: Diagnosis present

## 2021-10-20 DIAGNOSIS — Z888 Allergy status to other drugs, medicaments and biological substances status: Secondary | ICD-10-CM | POA: Diagnosis not present

## 2021-10-20 DIAGNOSIS — T24131A Burn of first degree of right lower leg, initial encounter: Secondary | ICD-10-CM | POA: Diagnosis present

## 2021-10-20 DIAGNOSIS — R2689 Other abnormalities of gait and mobility: Secondary | ICD-10-CM | POA: Diagnosis not present

## 2021-10-20 DIAGNOSIS — K2210 Ulcer of esophagus without bleeding: Secondary | ICD-10-CM | POA: Diagnosis not present

## 2021-10-20 DIAGNOSIS — N183 Chronic kidney disease, stage 3 unspecified: Secondary | ICD-10-CM | POA: Diagnosis present

## 2021-10-20 DIAGNOSIS — I252 Old myocardial infarction: Secondary | ICD-10-CM

## 2021-10-20 DIAGNOSIS — G4730 Sleep apnea, unspecified: Secondary | ICD-10-CM | POA: Diagnosis present

## 2021-10-20 DIAGNOSIS — Z923 Personal history of irradiation: Secondary | ICD-10-CM

## 2021-10-20 LAB — CBC WITH DIFFERENTIAL/PLATELET
Abs Immature Granulocytes: 0.04 10*3/uL (ref 0.00–0.07)
Basophils Absolute: 0.1 10*3/uL (ref 0.0–0.1)
Basophils Relative: 1 %
Eosinophils Absolute: 0.2 10*3/uL (ref 0.0–0.5)
Eosinophils Relative: 3 %
HCT: 26.2 % — ABNORMAL LOW (ref 39.0–52.0)
Hemoglobin: 7.9 g/dL — ABNORMAL LOW (ref 13.0–17.0)
Immature Granulocytes: 1 %
Lymphocytes Relative: 12 %
Lymphs Abs: 1 10*3/uL (ref 0.7–4.0)
MCH: 25.5 pg — ABNORMAL LOW (ref 26.0–34.0)
MCHC: 30.2 g/dL (ref 30.0–36.0)
MCV: 84.5 fL (ref 80.0–100.0)
Monocytes Absolute: 0.8 10*3/uL (ref 0.1–1.0)
Monocytes Relative: 10 %
Neutro Abs: 5.9 10*3/uL (ref 1.7–7.7)
Neutrophils Relative %: 73 %
Platelets: 285 10*3/uL (ref 150–400)
RBC: 3.1 MIL/uL — ABNORMAL LOW (ref 4.22–5.81)
RDW: 18 % — ABNORMAL HIGH (ref 11.5–15.5)
WBC: 8 10*3/uL (ref 4.0–10.5)
nRBC: 0 % (ref 0.0–0.2)

## 2021-10-20 LAB — COMPREHENSIVE METABOLIC PANEL
ALT: 13 U/L (ref 0–44)
AST: 10 U/L — ABNORMAL LOW (ref 15–41)
Albumin: 3.7 g/dL (ref 3.5–5.0)
Alkaline Phosphatase: 139 U/L — ABNORMAL HIGH (ref 38–126)
Anion gap: 12 (ref 5–15)
BUN: 45 mg/dL — ABNORMAL HIGH (ref 8–23)
CO2: 21 mmol/L — ABNORMAL LOW (ref 22–32)
Calcium: 8.6 mg/dL — ABNORMAL LOW (ref 8.9–10.3)
Chloride: 104 mmol/L (ref 98–111)
Creatinine, Ser: 3.67 mg/dL — ABNORMAL HIGH (ref 0.61–1.24)
GFR, Estimated: 18 mL/min — ABNORMAL LOW (ref >60)
Glucose, Bld: 270 mg/dL — ABNORMAL HIGH (ref 70–99)
Potassium: 5 mmol/L (ref 3.5–5.1)
Sodium: 137 mmol/L (ref 135–145)
Total Bilirubin: 0.4 mg/dL (ref 0.3–1.2)
Total Protein: 6.3 g/dL — ABNORMAL LOW (ref 6.5–8.1)

## 2021-10-20 LAB — URINALYSIS, ROUTINE W REFLEX MICROSCOPIC
Bilirubin Urine: NEGATIVE
Glucose, UA: 500 mg/dL — AB
Hgb urine dipstick: NEGATIVE
Ketones, ur: NEGATIVE mg/dL
Leukocytes,Ua: NEGATIVE
Nitrite: NEGATIVE
Protein, ur: 100 mg/dL — AB
Specific Gravity, Urine: 1.009 (ref 1.005–1.030)
pH: 7 (ref 5.0–8.0)

## 2021-10-20 LAB — TROPONIN I (HIGH SENSITIVITY): Troponin I (High Sensitivity): 24 ng/L — ABNORMAL HIGH (ref <18)

## 2021-10-20 LAB — CBG MONITORING, ED: Glucose-Capillary: 306 mg/dL — ABNORMAL HIGH (ref 70–99)

## 2021-10-20 LAB — BRAIN NATRIURETIC PEPTIDE: B Natriuretic Peptide: 3286.7 pg/mL — ABNORMAL HIGH (ref 0.0–100.0)

## 2021-10-20 MED ORDER — SERTRALINE HCL 50 MG PO TABS
150.0000 mg | ORAL_TABLET | Freq: Every day | ORAL | Status: DC
  Administered 2021-10-21 – 2021-10-28 (×8): 150 mg via ORAL
  Filled 2021-10-20 (×8): qty 1

## 2021-10-20 MED ORDER — HYDRALAZINE HCL 50 MG PO TABS
100.0000 mg | ORAL_TABLET | Freq: Three times a day (TID) | ORAL | Status: DC
  Administered 2021-10-20 – 2021-10-22 (×6): 100 mg via ORAL
  Filled 2021-10-20 (×7): qty 2

## 2021-10-20 MED ORDER — DOXAZOSIN MESYLATE 8 MG PO TABS
4.0000 mg | ORAL_TABLET | Freq: Every day | ORAL | Status: DC
  Administered 2021-10-21 – 2021-10-28 (×8): 4 mg via ORAL
  Filled 2021-10-20 (×8): qty 1

## 2021-10-20 MED ORDER — TRAMADOL HCL 50 MG PO TABS
25.0000 mg | ORAL_TABLET | Freq: Four times a day (QID) | ORAL | Status: DC | PRN
Start: 2021-10-20 — End: 2021-10-28
  Administered 2021-10-21: 25 mg via ORAL
  Filled 2021-10-20: qty 1

## 2021-10-20 MED ORDER — ONDANSETRON HCL 4 MG/2ML IJ SOLN: 4.0000 mg | Freq: Once | INTRAMUSCULAR | Status: DC

## 2021-10-20 MED ORDER — ONDANSETRON HCL 4 MG/2ML IJ SOLN
INTRAMUSCULAR | Status: AC
  Administered 2021-10-20: 4 mg
  Filled 2021-10-20: qty 2

## 2021-10-20 MED ORDER — NITROGLYCERIN 0.4 MG SL SUBL: 0.4000 mg | SUBLINGUAL_TABLET | SUBLINGUAL | Status: DC | PRN

## 2021-10-20 MED ORDER — HYDRALAZINE HCL 20 MG/ML IJ SOLN: 5.0000 mg | Freq: Four times a day (QID) | INTRAMUSCULAR | Status: DC | PRN

## 2021-10-20 MED ORDER — CARVEDILOL 12.5 MG PO TABS
12.5000 mg | ORAL_TABLET | Freq: Two times a day (BID) | ORAL | Status: DC
  Administered 2021-10-20 – 2021-10-28 (×16): 12.5 mg via ORAL
  Filled 2021-10-20 (×16): qty 1

## 2021-10-20 MED ORDER — ALBUTEROL SULFATE (2.5 MG/3ML) 0.083% IN NEBU: 3.0000 mL | INHALATION_SOLUTION | Freq: Four times a day (QID) | RESPIRATORY_TRACT | Status: DC | PRN

## 2021-10-20 MED ORDER — SEMAGLUTIDE (1 MG/DOSE) 2 MG/1.5ML ~~LOC~~ SOPN: 1.0000 mg | PEN_INJECTOR | SUBCUTANEOUS | Status: DC

## 2021-10-20 MED ORDER — FUROSEMIDE 10 MG/ML IJ SOLN
60.0000 mg | Freq: Three times a day (TID) | INTRAMUSCULAR | Status: DC
  Administered 2021-10-20 – 2021-10-21 (×2): 60 mg via INTRAVENOUS
  Filled 2021-10-20 (×2): qty 6

## 2021-10-20 MED ORDER — PANTOPRAZOLE SODIUM 40 MG PO TBEC
40.0000 mg | DELAYED_RELEASE_TABLET | Freq: Two times a day (BID) | ORAL | Status: DC
  Administered 2021-10-21 – 2021-10-22 (×2): 40 mg via ORAL
  Filled 2021-10-20 (×3): qty 1

## 2021-10-20 MED ORDER — INSULIN ASPART 100 UNIT/ML IJ SOLN
0.0000 [IU] | Freq: Three times a day (TID) | INTRAMUSCULAR | Status: DC
  Administered 2021-10-21 (×2): 5 [IU] via SUBCUTANEOUS
  Administered 2021-10-21: 3 [IU] via SUBCUTANEOUS

## 2021-10-20 MED ORDER — ONDANSETRON 4 MG PO TBDP
4.0000 mg | ORAL_TABLET | Freq: Three times a day (TID) | ORAL | Status: DC | PRN
  Filled 2021-10-20 (×2): qty 1

## 2021-10-20 MED ORDER — FUROSEMIDE 10 MG/ML IJ SOLN: 60.0000 mg | Freq: Four times a day (QID) | INTRAMUSCULAR | Status: DC

## 2021-10-20 MED ORDER — FUROSEMIDE 10 MG/ML IJ SOLN
80.0000 mg | Freq: Once | INTRAMUSCULAR | Status: AC
  Administered 2021-10-20: 80 mg via INTRAVENOUS
  Filled 2021-10-20: qty 8

## 2021-10-20 MED ORDER — ACETAMINOPHEN 325 MG PO TABS: 650.0000 mg | ORAL_TABLET | Freq: Four times a day (QID) | ORAL | Status: DC | PRN

## 2021-10-20 MED ORDER — SUCRALFATE 1 GM/10ML PO SUSP
1.0000 g | Freq: Once | ORAL | Status: AC
  Administered 2021-10-20: 1 g via ORAL
  Filled 2021-10-20: qty 10

## 2021-10-20 MED ORDER — ASPIRIN EC 325 MG PO TBEC: 325.0000 mg | DELAYED_RELEASE_TABLET | Freq: Every day | ORAL | Status: DC

## 2021-10-20 MED ORDER — ISOSORBIDE DINITRATE 10 MG PO TABS
30.0000 mg | ORAL_TABLET | Freq: Three times a day (TID) | ORAL | Status: DC
  Administered 2021-10-20 – 2021-10-21 (×3): 30 mg via ORAL
  Filled 2021-10-20 (×4): qty 3

## 2021-10-20 MED ORDER — ENOXAPARIN SODIUM 30 MG/0.3ML IJ SOSY
30.0000 mg | PREFILLED_SYRINGE | INTRAMUSCULAR | Status: DC
  Administered 2021-10-20: 30 mg via SUBCUTANEOUS
  Filled 2021-10-20: qty 0.3

## 2021-10-20 NOTE — ED Notes (Signed)
RT note: Pt. moved to rm. #5 via w/c, wife at b.s., v/s updated and given warm blankets. ?

## 2021-10-20 NOTE — ED Notes (Signed)
Pt reports feeling week and having increased falls over the past several days. Pt denies any injury. ?

## 2021-10-20 NOTE — Assessment & Plan Note (Signed)
Patient reports that his BP has been difficult to control for several weeks. He cannot say if this started with reduction in Torsemide. At admission 192/99 but asymptomatic ? ?Plan Continue home meds: coreg, hydralazine, diuretic ? PRN hydralazine for SBP > 190 ?

## 2021-10-20 NOTE — Telephone Encounter (Signed)
Call attempt, call unable to connect.  ?
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
## 2021-10-20 NOTE — Assessment & Plan Note (Signed)
Last AS1C 6.4% 07/25/21. He was recently taken off Ozempic injections. ? ?Plan Low carb diet ? SS coverage  ?
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
## 2021-10-20 NOTE — ED Triage Notes (Signed)
Pt reports with few weeks of lower extr. Weakness, has fallen few times due to weakness. He is undergoing treatment for esophagus cancer. Has chf/ckd/ lower extremity bilateral edema and vascular wounds noted on right leg,not new.  ?

## 2021-10-20 NOTE — Assessment & Plan Note (Signed)
Patient with combined HF followed by Dr. Oval Linsey and Dr. Wolfgang Phoenix, PCP. Last ECHO 07/25/21 with EF 40-45%, LVH, akinesis LV, basal inferior wall and inferoseptal wall. Recently his torsemide was reduced from 40 to 20 mg. He denies SOB/DOE, he denies chest pain. BNP at [facility] 3,287, down from >4500 in February. CXR w/o pulmonary edema. Exam revealed clear lungs but 2+ pitting edema LE to knees. ? ?Plan Cardiac tele admit ? Lasix 60 mg q8 IV ? Continue home meds ? Cardiology consult requested. ?

## 2021-10-20 NOTE — Assessment & Plan Note (Signed)
Patient with DM related CKD III. He follows with Dr. Moshe Cipro. His creatine is at his baseline. ? ?Plan Continue home meds ? Continue 2L fluid restriction ?
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
## 2021-10-20 NOTE — Assessment & Plan Note (Signed)
Baseline Hgb between 8 to 9 g. Currently at 7.9 g. He does have blood streaked sputum after Endoscopic esophageal ablation for cancer. ? ?Plan Will use low dose lovenox for DVT prophy ? F/u H/H in AM ?

## 2021-10-20 NOTE — Telephone Encounter (Signed)
Patient's phone not working, unable to contact via phone, sent mychart message to follow up on patient's condition.  ?

## 2021-10-21 ENCOUNTER — Other Ambulatory Visit (HOSPITAL_COMMUNITY): Payer: Self-pay

## 2021-10-21 DIAGNOSIS — T24001S Burn of unspecified degree of unspecified site of right lower limb, except ankle and foot, sequela: Secondary | ICD-10-CM | POA: Diagnosis not present

## 2021-10-21 DIAGNOSIS — Z7901 Long term (current) use of anticoagulants: Secondary | ICD-10-CM | POA: Diagnosis not present

## 2021-10-21 DIAGNOSIS — R531 Weakness: Secondary | ICD-10-CM | POA: Diagnosis present

## 2021-10-21 DIAGNOSIS — E1122 Type 2 diabetes mellitus with diabetic chronic kidney disease: Secondary | ICD-10-CM | POA: Diagnosis present

## 2021-10-21 DIAGNOSIS — I130 Hypertensive heart and chronic kidney disease with heart failure and stage 1 through stage 4 chronic kidney disease, or unspecified chronic kidney disease: Secondary | ICD-10-CM | POA: Diagnosis present

## 2021-10-21 DIAGNOSIS — Z951 Presence of aortocoronary bypass graft: Secondary | ICD-10-CM | POA: Diagnosis not present

## 2021-10-21 DIAGNOSIS — Z7982 Long term (current) use of aspirin: Secondary | ICD-10-CM | POA: Diagnosis not present

## 2021-10-21 DIAGNOSIS — E1159 Type 2 diabetes mellitus with other circulatory complications: Secondary | ICD-10-CM | POA: Diagnosis present

## 2021-10-21 DIAGNOSIS — D509 Iron deficiency anemia, unspecified: Secondary | ICD-10-CM | POA: Diagnosis present

## 2021-10-21 DIAGNOSIS — K921 Melena: Secondary | ICD-10-CM | POA: Diagnosis not present

## 2021-10-21 DIAGNOSIS — N184 Chronic kidney disease, stage 4 (severe): Secondary | ICD-10-CM | POA: Diagnosis present

## 2021-10-21 DIAGNOSIS — D631 Anemia in chronic kidney disease: Secondary | ICD-10-CM | POA: Diagnosis present

## 2021-10-21 DIAGNOSIS — K2210 Ulcer of esophagus without bleeding: Secondary | ICD-10-CM | POA: Diagnosis not present

## 2021-10-21 DIAGNOSIS — Z794 Long term (current) use of insulin: Secondary | ICD-10-CM | POA: Diagnosis not present

## 2021-10-21 DIAGNOSIS — Z8501 Personal history of malignant neoplasm of esophagus: Secondary | ICD-10-CM | POA: Diagnosis not present

## 2021-10-21 DIAGNOSIS — I5033 Acute on chronic diastolic (congestive) heart failure: Secondary | ICD-10-CM | POA: Diagnosis not present

## 2021-10-21 DIAGNOSIS — I5043 Acute on chronic combined systolic (congestive) and diastolic (congestive) heart failure: Secondary | ICD-10-CM

## 2021-10-21 DIAGNOSIS — N1831 Chronic kidney disease, stage 3a: Secondary | ICD-10-CM

## 2021-10-21 DIAGNOSIS — K317 Polyp of stomach and duodenum: Secondary | ICD-10-CM | POA: Diagnosis present

## 2021-10-21 DIAGNOSIS — Z91030 Bee allergy status: Secondary | ICD-10-CM | POA: Diagnosis not present

## 2021-10-21 DIAGNOSIS — Z888 Allergy status to other drugs, medicaments and biological substances status: Secondary | ICD-10-CM | POA: Diagnosis not present

## 2021-10-21 DIAGNOSIS — Z7985 Long-term (current) use of injectable non-insulin antidiabetic drugs: Secondary | ICD-10-CM | POA: Diagnosis not present

## 2021-10-21 DIAGNOSIS — H4312 Vitreous hemorrhage, left eye: Secondary | ICD-10-CM | POA: Diagnosis not present

## 2021-10-21 DIAGNOSIS — Z79899 Other long term (current) drug therapy: Secondary | ICD-10-CM | POA: Diagnosis not present

## 2021-10-21 DIAGNOSIS — I272 Pulmonary hypertension, unspecified: Secondary | ICD-10-CM | POA: Diagnosis present

## 2021-10-21 DIAGNOSIS — K2211 Ulcer of esophagus with bleeding: Secondary | ICD-10-CM | POA: Diagnosis present

## 2021-10-21 DIAGNOSIS — I2510 Atherosclerotic heart disease of native coronary artery without angina pectoris: Secondary | ICD-10-CM | POA: Diagnosis not present

## 2021-10-21 DIAGNOSIS — K222 Esophageal obstruction: Secondary | ICD-10-CM | POA: Diagnosis present

## 2021-10-21 DIAGNOSIS — X088XXS Exposure to other specified smoke, fire and flames, sequela: Secondary | ICD-10-CM | POA: Diagnosis present

## 2021-10-21 DIAGNOSIS — D62 Acute posthemorrhagic anemia: Secondary | ICD-10-CM | POA: Diagnosis not present

## 2021-10-21 DIAGNOSIS — G4730 Sleep apnea, unspecified: Secondary | ICD-10-CM | POA: Diagnosis present

## 2021-10-21 DIAGNOSIS — I5022 Chronic systolic (congestive) heart failure: Secondary | ICD-10-CM | POA: Diagnosis not present

## 2021-10-21 DIAGNOSIS — T798XXS Other early complications of trauma, sequela: Secondary | ICD-10-CM | POA: Diagnosis not present

## 2021-10-21 DIAGNOSIS — I509 Heart failure, unspecified: Secondary | ICD-10-CM

## 2021-10-21 LAB — BASIC METABOLIC PANEL
Anion gap: 7 (ref 5–15)
BUN: 60 mg/dL — ABNORMAL HIGH (ref 8–23)
CO2: 21 mmol/L — ABNORMAL LOW (ref 22–32)
Calcium: 8.1 mg/dL — ABNORMAL LOW (ref 8.9–10.3)
Chloride: 107 mmol/L (ref 98–111)
Creatinine, Ser: 3.66 mg/dL — ABNORMAL HIGH (ref 0.61–1.24)
GFR, Estimated: 18 mL/min — ABNORMAL LOW (ref >60)
Glucose, Bld: 231 mg/dL — ABNORMAL HIGH (ref 70–99)
Potassium: 5.1 mmol/L (ref 3.5–5.1)
Sodium: 135 mmol/L (ref 135–145)

## 2021-10-21 LAB — CBC
HCT: 19.6 % — ABNORMAL LOW (ref 39.0–52.0)
Hemoglobin: 5.9 g/dL — CL (ref 13.0–17.0)
MCH: 25.5 pg — ABNORMAL LOW (ref 26.0–34.0)
MCHC: 30.1 g/dL (ref 30.0–36.0)
MCV: 84.8 fL (ref 80.0–100.0)
Platelets: 263 10*3/uL (ref 150–400)
RBC: 2.31 MIL/uL — ABNORMAL LOW (ref 4.22–5.81)
RDW: 17.8 % — ABNORMAL HIGH (ref 11.5–15.5)
WBC: 7.6 10*3/uL (ref 4.0–10.5)
nRBC: 0 % (ref 0.0–0.2)

## 2021-10-21 LAB — HEMOGLOBIN AND HEMATOCRIT, BLOOD
HCT: 20.4 % — ABNORMAL LOW (ref 39.0–52.0)
Hemoglobin: 6.2 g/dL — CL (ref 13.0–17.0)

## 2021-10-21 LAB — FERRITIN: Ferritin: 53 ng/mL (ref 24–336)

## 2021-10-21 LAB — GLUCOSE, CAPILLARY
Glucose-Capillary: 201 mg/dL — ABNORMAL HIGH (ref 70–99)
Glucose-Capillary: 175 mg/dL — ABNORMAL HIGH (ref 70–99)
Glucose-Capillary: 209 mg/dL — ABNORMAL HIGH (ref 70–99)
Glucose-Capillary: 295 mg/dL — ABNORMAL HIGH (ref 70–99)

## 2021-10-21 LAB — RETICULOCYTES
Immature Retic Fract: 36.4 % — ABNORMAL HIGH (ref 2.3–15.9)
RBC.: 2.32 MIL/uL — ABNORMAL LOW (ref 4.22–5.81)
Retic Count, Absolute: 73.1 10*3/uL (ref 19.0–186.0)
Retic Ct Pct: 3.2 % — ABNORMAL HIGH (ref 0.4–3.1)

## 2021-10-21 LAB — IRON AND TIBC
Iron: 36 ug/dL — ABNORMAL LOW (ref 45–182)
Saturation Ratios: 13 % — ABNORMAL LOW (ref 17.9–39.5)
TIBC: 269 ug/dL (ref 250–450)
UIBC: 233 ug/dL

## 2021-10-21 LAB — PREPARE RBC (CROSSMATCH)

## 2021-10-21 LAB — VITAMIN B12: Vitamin B-12: 280 pg/mL (ref 180–914)

## 2021-10-21 LAB — FOLATE: Folate: 6.9 ng/mL (ref >5.9)

## 2021-10-21 MED ORDER — TRIMETHOBENZAMIDE HCL 100 MG/ML IM SOLN
200.0000 mg | Freq: Once | INTRAMUSCULAR | Status: AC | PRN
  Administered 2021-10-21: 200 mg via INTRAMUSCULAR
  Filled 2021-10-21: qty 2

## 2021-10-21 MED ORDER — SCOPOLAMINE 1 MG/3DAYS TD PT72
1.0000 | MEDICATED_PATCH | TRANSDERMAL | Status: DC
  Filled 2021-10-21: qty 1

## 2021-10-21 MED ORDER — SODIUM CHLORIDE 0.9% IV SOLUTION: Freq: Once | INTRAVENOUS | Status: AC

## 2021-10-21 MED ORDER — INSULIN ASPART 100 UNIT/ML IJ SOLN
0.0000 [IU] | Freq: Every day | INTRAMUSCULAR | Status: DC
  Administered 2021-10-22 – 2021-10-23 (×2): 3 [IU] via SUBCUTANEOUS

## 2021-10-21 MED ORDER — ONDANSETRON HCL 4 MG/2ML IJ SOLN
4.0000 mg | Freq: Four times a day (QID) | INTRAMUSCULAR | Status: DC | PRN
  Administered 2021-10-21 – 2021-10-27 (×3): 4 mg via INTRAVENOUS
  Filled 2021-10-21 (×3): qty 2

## 2021-10-21 MED ORDER — INSULIN ASPART 100 UNIT/ML IJ SOLN
0.0000 [IU] | Freq: Three times a day (TID) | INTRAMUSCULAR | Status: DC
  Administered 2021-10-22 (×2): 3 [IU] via SUBCUTANEOUS
  Administered 2021-10-22: 1 [IU] via SUBCUTANEOUS
  Administered 2021-10-23: 2 [IU] via SUBCUTANEOUS
  Administered 2021-10-23: 3 [IU] via SUBCUTANEOUS
  Administered 2021-10-24: 5 [IU] via SUBCUTANEOUS
  Administered 2021-10-24 – 2021-10-25 (×3): 2 [IU] via SUBCUTANEOUS
  Administered 2021-10-25: 3 [IU] via SUBCUTANEOUS
  Administered 2021-10-25: 1 [IU] via SUBCUTANEOUS
  Administered 2021-10-26: 2 [IU] via SUBCUTANEOUS
  Administered 2021-10-26: 5 [IU] via SUBCUTANEOUS
  Administered 2021-10-26: 1 [IU] via SUBCUTANEOUS
  Administered 2021-10-27: 5 [IU] via SUBCUTANEOUS
  Administered 2021-10-27: 3 [IU] via SUBCUTANEOUS
  Administered 2021-10-27 – 2021-10-28 (×2): 1 [IU] via SUBCUTANEOUS

## 2021-10-21 MED ORDER — SUCRALFATE 1 GM/10ML PO SUSP
1.0000 g | Freq: Three times a day (TID) | ORAL | Status: DC
  Administered 2021-10-21 – 2021-10-28 (×25): 1 g via ORAL
  Filled 2021-10-21 (×27): qty 10

## 2021-10-21 MED ORDER — FUROSEMIDE 10 MG/ML IJ SOLN
80.0000 mg | Freq: Two times a day (BID) | INTRAMUSCULAR | Status: AC
  Administered 2021-10-21: 80 mg via INTRAVENOUS
  Filled 2021-10-21 (×2): qty 8

## 2021-10-21 NOTE — Evaluation (Signed)
Physical Therapy Evaluation ?Patient Details ?Name: [name] ?MRN: [id number] ?DOB: 09-30-1955 ?Today's Date: 10/21/2021 ? ?History of Present Illness ? Pt is a 66 y/o male admitted secondary to increased weakness and falls. Found to have CHF exacerbation and anemia. PMH includes HTN, DM, CKD, CHF, CVA, and esophageal cancer.  ?Clinical Impression ? Pt admitted secondary to problem above with deficits below. Pt requiring mod A for bed mobility and min A to stand and take steps at EOB. Pt with increased lethargy so mobility limited this session. Pt with multiple falls and currently lives alone. Recommending SNF level therapies at d/c to address current deficits. Will continue to follow acutely.    ?   ? ?Recommendations for follow up therapy are one component of a multi-disciplinary discharge planning process, led by the attending physician.  Recommendations may be updated based on patient status, additional functional criteria and insurance authorization. ? ?Follow Up Recommendations Skilled nursing-short term rehab (<3 hours/day) ? ?  ?Assistance Recommended at Discharge Frequent or constant Supervision/Assistance  ?Patient can return home with the following ? A little help with walking and/or transfers;A little help with bathing/dressing/bathroom;Assistance with cooking/housework;Help with stairs or ramp for entrance;Assist for transportation;Direct supervision/assist for financial management;Direct supervision/assist for medications management ? ?  ?Equipment Recommendations None recommended by PT  ?Recommendations for Other Services ?    ?  ?Functional Status Assessment Patient has had a recent decline in their functional status and demonstrates the ability to make significant improvements in function in a reasonable and predictable amount of time.  ? ?  ?Precautions / Restrictions Precautions ?Precautions: Fall ?Precaution Comments: multiple falls at home ?Restrictions ?Weight Bearing Restrictions: No  ? ?   ? ?Mobility ? Bed Mobility ?Overal bed mobility: Needs Assistance ?Bed Mobility: Supine to Sit, Sit to Supine ?  ?  ?Supine to sit: Mod assist ?Sit to supine: Min assist ?  ?General bed mobility comments: Assist for trunk elevation to come to sitting. Min A for LE assist back to bed. ?  ? ?Transfers ?Overall transfer level: Needs assistance ?Equipment used: Rolling walker (2 wheels) ?Transfers: Sit to/from Stand ?Sit to Stand: Min assist ?  ?  ?  ?  ?  ?General transfer comment: Min A for lift assist and steadying ?  ? ?Ambulation/Gait ?Ambulation/Gait assistance: Min assist ?Gait Distance (Feet): 3 Feet ?Assistive device: Rolling walker (2 wheels) ?Gait Pattern/deviations: Step-through pattern, Decreased stride length ?Gait velocity: Decreased ?  ?  ?General Gait Details: Slow, unsteady gait, especially when taking steps backwards. Pt lethargic which limited furhter mobility. ? ?Stairs ?  ?  ?  ?  ?  ? ?Wheelchair Mobility ?  ? ?Modified Rankin (Stroke Patients Only) ?  ? ?  ? ?Balance Overall balance assessment: Needs assistance ?Sitting-balance support: No upper extremity supported, Feet supported ?Sitting balance-Leahy Scale: Fair ?  ?  ?Standing balance support: Bilateral upper extremity supported ?Standing balance-Leahy Scale: Poor ?Standing balance comment: Reliant on BUE support ?  ?  ?  ?  ?  ?  ?  ?  ?  ?  ?  ?   ? ? ? ?Pertinent Vitals/Pain Pain Assessment ?Pain Assessment: No/denies pain  ? ? ?Home Living Family/patient expects to be discharged to:: Private residence ?Living Arrangements: Alone ?Available Help at Discharge: Personal care attendant ([name]-comes 5 days/week for about an hour) ?Type of Home: House ?Home Access: Stairs to enter ?Entrance Stairs-Rails: Right ?Entrance Stairs-Number of Steps: 6 ?Alternate Level Stairs-Number of Steps: 6 + 6 ?Home  Layout: Multi-level ?Home Equipment: Grab bars - tub/shower;Rolling Walker (2 wheels);Cane - quad ?   ?  ?Prior Function Prior Level of Function :  Needs assist ?  ?  ?  ?  ?  ?  ?Mobility Comments: Uses RW ?ADLs Comments: [name] helps with IADL tasks. Reports independence with ADL tasks ?  ? ? ?Hand Dominance  ?   ? ?  ?Extremity/Trunk Assessment  ? Upper Extremity Assessment ?Upper Extremity Assessment: Defer to OT evaluation ?  ? ?Lower Extremity Assessment ?Lower Extremity Assessment: Generalized weakness ?  ? ?Cervical / Trunk Assessment ?Cervical / Trunk Assessment: Kyphotic  ?Communication  ? Communication: No difficulties  ?Cognition Arousal/Alertness: Lethargic ?Behavior During Therapy: Flat affect ?Overall Cognitive Status: No family/caregiver present to determine baseline cognitive functioning ?  ?  ?  ?  ?  ?  ?  ?  ?  ?  ?  ?  ?  ?  ?  ?  ?General Comments: Pt very sleepy and falling asleep when sitting EOB and when standing. Slow processing noted. ?  ?  ? ?  ?General Comments General comments (skin integrity, edema, etc.): No family present ? ?  ?Exercises    ? ?Assessment/Plan  ?  ?PT Assessment Patient needs continued PT services  ?PT Problem List Decreased strength;Decreased activity tolerance;Decreased mobility;Decreased balance;Decreased cognition;Decreased knowledge of use of DME;Decreased safety awareness;Decreased knowledge of precautions ? ?   ?  ?PT Treatment Interventions DME instruction;Functional mobility training;Therapeutic exercise;Therapeutic activities;Balance training;Stair training;Gait training;Patient/family education   ? ?PT Goals (Current goals can be found in the Care Plan section)  ?Acute Rehab PT Goals ?Patient Stated Goal: to go home ?PT Goal Formulation: With patient ?Time For Goal Achievement: 11/04/21 ?Potential to Achieve Goals: Good ? ?  ?Frequency Min 2X/week ?  ? ? ?Co-evaluation   ?  ?  ?  ?  ? ? ?  ?AM-PAC PT "6 Clicks" Mobility  ?Outcome Measure Help needed turning from your back to your side while in a flat bed without using bedrails?: A Little ?Help needed moving from lying on your back to sitting on the  side of a flat bed without using bedrails?: A Lot ?Help needed moving to and from a bed to a chair (including a wheelchair)?: A Little ?Help needed standing up from a chair using your arms (e.g., wheelchair or bedside chair)?: A Little ?Help needed to walk in hospital room?: A Lot ?Help needed climbing 3-5 steps with a railing? : Total ?6 Click Score: 14 ? ?  ?End of Session Equipment Utilized During Treatment: Gait belt ?Activity Tolerance: Patient limited by lethargy ?Patient left: in bed;with call bell/phone within reach;with bed alarm set;with nursing/sitter in room ?Nurse Communication: Mobility status ?PT Visit Diagnosis: Unsteadiness on feet (R26.81);Muscle weakness (generalized) (M62.81);History of falling (Z91.81);Repeated falls (R29.6) ?  ? ?Time: 5621-3086 ?PT Time Calculation (min) (ACUTE ONLY): 14 min ? ? ?Charges:   PT Evaluation ?$PT Eval Moderate Complexity: 1 Mod ?  ?  ?   ? ? ?Reuel Derby, PT, DPT  ?Acute Rehabilitation Services  ?Pager: [phone] ?Office: [phone] ? ? ?[place] ?10/21/2021, 4:45 PM ? ?

## 2021-10-21 NOTE — Progress Notes (Signed)
Heart Failure Navigator Progress Note ? ?Assessed for Heart & Vascular TOC clinic readiness.  ?Patient does not meet criteria due to metastatic cancer. .  ? ? ? ?Earnestine Leys, BSN, RN ?Heart Failure Nurse Navigator ?Secure Chat Only   ?

## 2021-10-21 NOTE — Progress Notes (Signed)
Inpatient Diabetes Program Recommendations ? ?AACE/ADA: New Consensus Statement on Inpatient Glycemic Control (2015) ? ?Target Ranges:  Prepandial:   less than 140 mg/dL ?     Peak postprandial:   less than 180 mg/dL (1-2 hours) ?     Critically ill patients:  140 - 180 mg/dL  ? ?Lab Results  ?Component Value Date  ? GLUCAP 175 (H) 10/21/2021  ? HGBA1C 6.4 (H) 07/25/2021  ? ? ?Review of Glycemic Control ? Latest Reference Range & Units 10/20/21 14:03 10/21/21 06:07 10/21/21 10:58  ?Glucose-Capillary 70 - 99 mg/dL 306 (H) 201 (H) 175 (H)  ?(H): Data is abnormally high ?Diabetes history: Type 2 DM ?Outpatient Diabetes medications: Ozempic 1 mg qwk (recently DCd by Dr Nehemiah Settle) ?Current orders for Inpatient glycemic control: Novolog 0-15 units TID ? ?Inpatient Diabetes Program Recommendations:   ? ?Consider adding Levemir 10 units QD.  ? ?Thanks, ?Bronson Curb, MSN, RNC-OB ?Diabetes Coordinator ?[phone] (8a-5p) ? ? ? ? ?

## 2021-10-21 NOTE — Consult Note (Addendum)
Consultation [facility]/ Jomarie Longs Primary Care Physician:  Babs Sciara, MD Primary Gastroenterologist:  Dr.Shaheen/ [facility]- DR Karilyn Cota  Reason for Consultation: profound  anemia  HPI: [name] is a 67 y.o. male, who was admitted last evening after he presented to the emergency room with progressive weakness and shortness of breath. 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 Up in the emergency room with negative chest x-ray but BNP of 3287 Labs revealed creatinine of 3.6 and hemoglobin 6.2 down to 5.9 this morning.  Patient has baseline hemoglobin of about 10. 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 He  is known to Dr. Karilyn Cota, and was referred to [facility]/Dr. Enrigue Catena for treatment.  He underwent EMR September 2022 , then he had initial EGD with cryoablation in January 2023, and had a second treatment with ablation on 09/27/2021. Patient says that he did have black stools for about a week after the procedure in April and then that stopped and stools have been appearing normal since.  He initially had some dysphagia and odynophagia which resolved within several days of the procedure as well.  Over the past couple of weeks he has not noticed any melena or hematochezia.  He is on Protonix at home and has been on Carafate slurry since the EGD.  Reviewing his labs, March 2023 hemoglobin 10.3, 10/07/2021 hemoglobin 8.8, today hemoglobin 5.9/hematocrit 19.6, MCV 84.8, platelets 263.  He does not require anticoagulation, has been on baby aspirin daily  He is being transfused 2 units today.  Ferritin 53/iron 36/TIBC 269/iron sat 13  He admits to being short of breath, is not requiring oxygen, little bit better today than  yesterday.   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    Past Surgical History:  Procedure Laterality Date   BIOPSY  06/27/2019   Procedure: BIOPSY;  Surgeon: Malissa Hippo, MD;  [facility]: [facility];  Service: Endoscopy;;  ascending colon polyp   BIOPSY  10/21/2020   Procedure: BIOPSY;  Surgeon: Malissa Hippo, MD;  [facility]: [facility];  Service: Endoscopy;;  esophageal,gastric polyp   BIOPSY  02/24/2021   Procedure: BIOPSY;  Surgeon: Malissa Hippo, MD;  [facility]: [facility];  Service: Endoscopy;;  distal and proximal esophageal biopsies    CARDIAC SURGERY     CATARACT EXTRACTION     COLONOSCOPY N/A 06/27/2019   [name]:Diverticulosis in the entire examined colon. tubular adenoma in ascending colon, 4mm tubular adenoma in prox sigmoid. external hemorrhoids   CORONARY ARTERY BYPASS GRAFT N/A 03/25/2019   Procedure: CORONARY ARTERY BYPASS GRAFTING (CABG) X  4 USING LEFT INTERNAL  MAMMARY ARTERY AND RIGHT SAPHENOUS VEIN GRAFTS;  Surgeon: Corliss Skains, MD;  Location: [facility] OR;  Service: Open Heart Surgery;  Laterality: N/A;   ESOPHAGOGASTRODUODENOSCOPY (EGD) WITH PROPOFOL N/A 10/21/2020   rehman:Normal hypopharynx.Normal proximal esophagus and mid esophagus.Esophageal mucosal changes consistent with long-segment Barrett's esophagus. (Focal low-grade dysplasia and atypia proximally) z line  irregular 37 cm from incisors, 3 cm HH, single gastric polyp (fundic gland) normal duodenal bulb/second portion of duodenum, proximal margin of Barrett's at 34 cm   ESOPHAGOGASTRODUODENOSCOPY (EGD) WITH PROPOFOL N/A 02/24/2021   Procedure: ESOPHAGOGASTRODUODENOSCOPY (EGD) WITH PROPOFOL;  Surgeon: Malissa Hippo, MD;  [facility];  Service: Endoscopy;  Laterality: N/A;  1:35   ESOPHAGOGASTRODUODENOSCOPY (EGD) WITH PROPOFOL N/A 03/21/2021   Procedure: ESOPHAGOGASTRODUODENOSCOPY (EGD) WITH PROPOFOL;  Surgeon: Jeani Hawking, MD;  Location: [facility] ENDOSCOPY;  Service: Endoscopy;  Laterality: N/A;   EXCISION MORTON'S NEUROMA     EYE SURGERY Left    retina   INCISION AND DRAINAGE OF WOUND Right 06/28/2021   Procedure: IRRIGATION AND DEBRIDEMENT WOUND;  Surgeon: Allena Napoleon, MD;  Location: [facility] OR;  Service: Plastics;  Laterality: Right;  1.5 hour   POLYPECTOMY  06/27/2019   Procedure: POLYPECTOMY;  Surgeon: Malissa Hippo, MD;  [facility]: [facility];  Service: Endoscopy;;  proximal sigmoid colon   RIGHT/LEFT HEART CATH AND CORONARY ANGIOGRAPHY N/A 03/12/2019   Procedure: RIGHT/LEFT HEART CATH AND CORONARY ANGIOGRAPHY;  Surgeon: Corky Crafts, MD;  Location: [facility] INVASIVE CV LAB;  Service: Cardiovascular;  Laterality: N/A;   SHOULDER ARTHROSCOPY WITH ROTATOR CUFF REPAIR AND SUBACROMIAL DECOMPRESSION Right 08/26/2020   Procedure: RIGHT SHOULDER MINI OPEN ROTATOR CUFF REPAIR AND SUBACROMIAL DECOMPRESSION WITH PATCH GRAFT;  Surgeon: Jene Every, MD;  Location: [facility] ORS;  Service: Orthopedics;  Laterality: Right;  90 MINS GENERAL WITH BLOCK   SKIN SPLIT GRAFT Right 06/28/2021   Procedure: SKIN GRAFT SPLIT THICKNESS;  Surgeon: Allena Napoleon, MD;  Location: [facility] OR;  Service: Plastics;  Laterality: Right;   TEE WITHOUT CARDIOVERSION N/A 03/25/2019   Procedure: TRANSESOPHAGEAL ECHOCARDIOGRAM (TEE);  Surgeon: Corliss Skains, MD;  Location: [facility] OR;  Service: Open Heart Surgery;  Laterality:  N/A;    Prior to Admission medications   Medication Sig Start Date End Date Taking? Authorizing Provider  acetaminophen (TYLENOL) 325 MG tablet Take 2 tablets (650 mg total) by mouth every 6 (six) hours as needed for mild pain, moderate pain or fever. 08/02/21  Yes Drema Dallas, MD  albuterol (VENTOLIN HFA) 108 (90 Base) MCG/ACT inhaler Inhale 2 puffs into the lungs every 6 (six) hours as needed. Patient taking differently: Inhale 2 puffs into the lungs every 6 (six) hours as needed for wheezing or shortness of breath. 06/24/21  Yes Cook, Jayce G, DO  ascorbic acid (VITAMIN C) 500 MG tablet Take 1 tablet (500 mg total) by mouth daily. 08/03/21  Yes Drema Dallas, MD  aspirin EC 325 MG tablet Take 1 tablet (325 mg total) by mouth daily. 09/14/21  Yes Alver Sorrow, NP  carvedilol (COREG) 12.5 MG tablet Take 1 tablet (12.5 mg total) by mouth 2 (two) times daily. 09/14/21  Yes Alver Sorrow, NP  Cholecalciferol (VITAMIN D3 PO) Take 1,000 Units by mouth daily.   Yes [provider]  doxazosin (CARDURA) 4 MG tablet Take 1 tablet (4 mg total) by mouth daily. 09/14/21  Yes Alver Sorrow, NP  EPINEPHrine 0.3 mg/0.3 mL IJ SOAJ injection Inject 0.3 mg into the muscle  as needed for anaphylaxis. 01/25/21  Yes Babs Sciara, MD  hydrALAZINE (APRESOLINE) 100 MG tablet Take 1 tablet (100 mg total) by mouth 3 (three) times daily. 09/14/21  Yes Alver Sorrow, NP  hydrOXYzine (VISTARIL) 25 MG capsule 1 nightly as needed allergies and insomnia 09/15/21  Yes [name], Jonna Coup, MD  isosorbide dinitrate (ISORDIL) 30 MG tablet Take 1 tablet (30 mg total) by mouth 3 (three) times daily. 09/14/21  Yes Alver Sorrow, NP  Multiple Vitamin (MULTIVITAMIN WITH MINERALS) TABS tablet Take 1 tablet by mouth daily.   Yes [provider]  nitroGLYCERIN (NITROSTAT) 0.4 MG SL tablet Place 1 tablet (0.4 mg total) under the tongue every 5 (five) minutes as needed for chest pain. 09/14/21  Yes Alver Sorrow,  NP  ondansetron (ZOFRAN-ODT) 4 MG disintegrating tablet TAKE 1 TABLET BY MOUTH EVERY 8 HOURS AS NEEDED FOR NAUSEA AND VOMITING Patient taking differently: Take 4 mg by mouth every 8 (eight) hours as needed for nausea or vomiting. 10/11/21  Yes Carlan, Chelsea L, NP  pantoprazole (PROTONIX) 40 MG tablet Take 1 tablet (40 mg total) by mouth 2 (two) times daily before a meal. 03/23/21  Yes Leroy Sea, MD  REPATHA SURECLICK 140 MG/ML SOAJ Inject 140 mg as directed every 14 (fourteen) days. 09/14/21  Yes Alver Sorrow, NP  Semaglutide (OZEMPIC, 1 MG/DOSE, [place]) Inject 1 mg into the skin once a week.   Yes [provider]  sertraline (ZOLOFT) 100 MG tablet Take 1 and half tablet  by mouth daily Patient taking differently: Take 150 mg by mouth daily. Take 1 and half tablet  by mouth daily 09/20/21  Yes [name], Jonna Coup, MD  sucralfate (CARAFATE) 1 GM/10ML suspension Take 1 g by mouth 4 (four) times daily as needed (stomach pain). 10/04/21  Yes [provider]  Torsemide 40 MG TABS Take 40 mg by mouth daily. Take additional tablet in the afternoon as needed for weight gain of 2 pounds overnight or 5 pounds in one week. 09/14/21  Yes Alver Sorrow, NP  traMADol (ULTRAM) 50 MG tablet Take 25 mg by mouth every 6 (six) hours as needed for moderate pain.   Yes [provider]  Insulin Pen Needle (B-D ULTRAFINE III SHORT PEN) 31G X 8 MM MISC 1 each by Does not apply route as directed. 05/30/19   Roma Kayser, MD    Current Facility-Administered Medications  Medication Dose Route Frequency Provider Last Rate Last Admin   0.9 %  sodium chloride infusion (Manually program via Guardrails IV Fluids)   Intravenous Once Zannie Cove, MD       acetaminophen (TYLENOL) tablet 650 mg  650 mg Oral Q6H PRN Norins, Rosalyn Gess, MD       albuterol (PROVENTIL) (2.5 MG/3ML) 0.083% nebulizer solution 3 mL  3 mL Inhalation Q6H PRN Norins, Rosalyn Gess, MD       carvedilol (COREG) tablet 12.5  mg  12.5 mg Oral BID Norins, Rosalyn Gess, MD   12.5 mg at 10/21/21 1002   doxazosin (CARDURA) tablet 4 mg  4 mg Oral Daily Norins, Rosalyn Gess, MD   4 mg at 10/21/21 1003   furosemide (LASIX) injection 80 mg  80 mg Intravenous BID Zannie Cove, MD       hydrALAZINE (APRESOLINE) injection 5 mg  5 mg Intravenous Q6H PRN Norins, Rosalyn Gess, MD       hydrALAZINE (APRESOLINE) tablet 100 mg  100 mg Oral TID Illene Regulus  E, MD   100 mg at 10/21/21 1002   insulin aspart (novoLOG) injection 0-15 Units  0-15 Units Subcutaneous TID WC Norins, Rosalyn Gess, MD   3 Units at 10/21/21 1207   isosorbide dinitrate (ISORDIL) tablet 30 mg  30 mg Oral TID Jacques Navy, MD   30 mg at 10/21/21 1002   nitroGLYCERIN (NITROSTAT) SL tablet 0.4 mg  0.4 mg Sublingual Q5 min PRN Norins, Rosalyn Gess, MD       pantoprazole (PROTONIX) EC tablet 40 mg  40 mg Oral BID AC Norins, Rosalyn Gess, MD   40 mg at 10/21/21 1517   sertraline (ZOLOFT) tablet 150 mg  150 mg Oral Daily Norins, Rosalyn Gess, MD   150 mg at 10/21/21 1002   traMADol (ULTRAM) tablet 25 mg  25 mg Oral Q6H PRN Jacques Navy, MD   25 mg at 10/21/21 1009    Allergies as of 10/20/2021 - Review Complete 10/20/2021  Allergen Reaction Noted   Bee venom Anaphylaxis 07/29/2015   Atorvastatin  02/20/2020   Diltiazem Itching 02/05/2021   Reglan [metoclopramide] Other (See Comments) 09/16/2021   Rosuvastatin  05/05/2020   Valsartan Itching 02/18/2021    Family History  Problem Relation Age of Onset   Colon cancer Mother    Heart Problems Father    Diabetes Father    Valvular heart disease Father    Sleep apnea Neg Hx    Stroke Neg Hx     Social History   Socioeconomic History   Marital status: Single    Spouse name: Not on file   Number of children: Not on file   Years of education: Not on file   Highest education level: Not on file  Occupational History   Not on file  Tobacco Use   Smoking status: Former    Types: Cigarettes    Quit date: 07/06/1988     Years since quitting: 33.3   Smokeless tobacco: Never  Vaping Use   Vaping Use: Never used  Substance and Sexual Activity   Alcohol use: Not Currently    Alcohol/week: 2.0 standard drinks    Types: 2 Glasses of wine per week    Comment: socially   Drug use: No   Sexual activity: Not on file  Other Topics Concern   Not on file  Social History Narrative   Not on file   Social Determinants of Health   Financial Resource Strain: Medium Risk   Difficulty of Paying Living Expenses: Somewhat hard  Food Insecurity: No Food Insecurity   Worried About Programme researcher, broadcasting/film/video in the Last Year: Never true   Barista in the Last Year: Never true  Transportation Needs: No Transportation Needs   Lack of Transportation (Medical): No   Lack of Transportation (Non-Medical): No  Physical Activity: Not on file  Stress: Not on file  Social Connections: Not on file  Intimate Partner Violence: Not on file    Review of Systems: Pertinent positive and negative review of systems were noted in the above HPI section.  All other review of systems was otherwise negative.   Physical Exam: Vital signs in last 24 hours: Temp:  [98 F (36.7 C)-99.1 F (37.3 C)] 98.9 F (37.2 C) (05/11 1130) Pulse Rate:  [79-98] 94 (05/11 1130) Resp:  [15-19] 18 (05/11 1130) BP: (112-193)/(67-110) 112/67 (05/11 1130) SpO2:  [93 %-100 %] 97 % (05/11 1130) Weight:  [89.1 kg-93.4 kg] 93.4 kg (05/11 0208)   General:  Alert,  Well-developed, well-nourished, fatigued appearing older white male pleasant and cooperative in NAD Head:  Normocephalic and atraumatic. Eyes:  Sclera clear, no icterus.   Conjunctiva pale Ears:  Normal auditory acuity. Nose:  No deformity, discharge,  or lesions. Mouth:  No deformity or lesions.   Neck:  Supple; no masses or thyromegaly. Lungs:  Clear throughout to auscultation.   No wheezes, crackles, or rhonchi.  Increased work of breathing.   Heart:  Regular rate and rhythm; no  murmurs, clicks, rubs,  or gallops. Abdomen:  Soft,nontender, BS active,nonpalp mass or hsm.   Rectal: Not done Msk:  Symmetrical without gross deformities. . Pulses:  Normal pulses noted. Extremities:  Without clubbing or edema. Neurologic:  Alert and  oriented x4;  grossly normal neurologically. Skin:  Intact without significant lesions or rashes.. Psych:  Alert and cooperative. Normal mood and affect.  Intake/Output from previous day: 05/10 0701 - 05/11 0700 In: 480 [P.O.:480] Out: 2350 [Urine:2350] Intake/Output this shift: Total I/O In: 330 [P.O.:300; Blood:30] Out: 700 [Urine:700]  Lab Results: Recent Labs    10/20/21 1525 10/21/21 0359 10/21/21 1005  WBC 8.0  --  7.6  HGB 7.9* 6.2* 5.9*  HCT 26.2* 20.4* 19.6*  PLT 285  --  263   BMET Recent Labs    10/20/21 1525 10/21/21 0359  NA 137 135  K 5.0 5.1  CL 104 107  CO2 21* 21*  GLUCOSE 270* 231*  BUN 45* 60*  CREATININE 3.67* 3.66*  CALCIUM 8.6* 8.1*   LFT Recent Labs    10/20/21 1525  PROT 6.3*  ALBUMIN 3.7  AST 10*  ALT 13  ALKPHOS 139*  BILITOT 0.4   PT/INR No results for input(s): LABPROT, INR in the last 72 hours. Hepatitis Panel No results for input(s): HEPBSAG, HCVAB, HEPAIGM, HEPBIGM in the last 72 hours.    IMPRESSION:  #74 66 year old white male with poorly differentiated invasive adenocarcinoma arising from Barrett's esophagus with high-grade glandular dysplasia, diagnosed September 2022. He has undergone EGD  with EMR 9/22, then with cryoablation in January 2023 at [facility], and had second session 09/27/2021. Followed at [facility], has seen oncology and radiation oncology.  He had PET scan done on 10/01/2021 which was 4 days post ablation and this showed new masslike asymmetric thickening of the GE junction with associated hypermetabolic activity consistent with progression of disease, uptake relatively low, also noted some presacral soft tissue nodularity small bilateral effusions. Per notes  esophageal changes felt to be consistent with post RFA inflammation.  Patient had dysphagia and odynophagia immediately postprocedure which has since resolved.  He says he saw black stool for about a week after the procedure, that resolved and has not recurred.  He vomited a small amount of dark material yesterday  #2 profound anemia-hemoglobin down 4 g over the past 2 months, only iron  deficiency. Suspect he has had ongoing slow GI blood loss over the past 3 weeks, not overt, secondary to spectated ulceration of the esophagus post ablation.  #3 progressive weakness and shortness of breath-most likely combination of exacerbation of congestive heart failure in setting of progressive anemia  #4 CHF-EF 28% #5 coronary artery disease status post CABG #6 chronic kidney disease stage IV #7 prior history of CVA #8 history of diabetes mellitus    PLAN: Transfuse to keep hemoglobin 8 or above I continue po PPI twice daily for now Serial hemoglobin Carafate slurry 1 g 4 times daily between meals and bedtime Would not plan repeat EGD  in this setting unless he has evidence of ongoing active bleeding during this admission. GI will follow with you   Amy Esterwood PA-C 10/21/2021, 1:26 PM   I have taken an interval history, thoroughly reviewed the chart and examined the patient. I agree with the Advanced Practitioner's note, impression and recommendations, and have recorded additional findings, impressions and recommendations below. I performed a substantive portion of this encounter (>50% time spent), including a complete performance of the medical decision making.  My additional thoughts are as follows:  This patient with an endoscopic ablation of Barrett's esophagus on April 17 by Dr. Alvia Grove of [facility] is primarily admitted for congestive heart failure/volume overload and also found to acute on chronic anemia. Records indicate he was admitted to the main [facility] hospital the day after his  ablation for GI bleeding and heart failure as well, and he was managed conservatively without need for upper endoscopy. It sounds as if he has had some intermittent GI bleeding manifested by passage of black stool in the weeks following that, last episode perhaps a week ago.  He has had no black tarry stool or bright red blood per rectum since admission, is responding to diuresis and receiving 2 units of PRBCs.  He is not currently having overt upper GI bleeding, much less a hemodynamically significant GI bleed, so we will manage him conservatively with acid suppression, elevated head of bed and addition of Carafate slurry 3 times daily.  No current plans for endoscopy.  I will email Dr. Enrigue Catena with an update on his patient and we will check on this patient tomorrow.  Charlie Pitter III [phone]
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
## 2021-10-21 NOTE — Telephone Encounter (Signed)
Pt seen at [facility] yesterday, still currently there. ?

## 2021-10-21 NOTE — TOC Benefit Eligibility Note (Signed)
Patient Advocate Encounter ? ?Insurance verification completed.   ? ?The patient is currently admitted and upon discharge could be taking Farxiga 10 mg. ? ?The current 30 day co-pay is, $0.00.  ? ?The patient is currently admitted and upon discharge could be taking Jardiance 10 mg. ? ?The current 30 day co-pay is, $0.00.  ? ?The patient is insured through [employer] Part D  ? ? ? ?[name], CPhT ?Pharmacy Patient Advocate Specialist ?[facility] Patient Advocate Team ?Direct Number: [phone]  Fax: [phone] ? ? ? ? ? ?  ?
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
## 2021-10-21 NOTE — Progress Notes (Signed)
Overnight progress note ? ?Hemoglobin 6.2 on morning labs, was 7.9 yesterday.  Per RN, patient continues to have slightly blood-streaked sputum. ?-Type and screen, 1 unit PRBCs ordered after obtaining consent from the patient ?-Follow-up posttransfusion H&H ?-Hold Aspirin and Lovenox ?

## 2021-10-21 NOTE — Social Work (Signed)
Outpatient LCSW contacted inpatient [facility] team. Pt sees Dr. Oval Linsey and [name], NP, for [facility] management. Pt lives at home alone, [name] assists but does not live with patient at this time. Pt has [place] services through [place]. This LCSW had assisted w/ help of PCP to complete PCS forms with [facility]. Pt application had been processed- he needed to arrange a time to have a home assessment done- pt had been sent this number via call, text, and MyChart message, however due to multiple hospitalizations it is unclear if he has been able to do so.  ? ?Pt lives in split level home, has had multiple falls during this time, I let inpatient TOC know that SNF may be a good idea due to multiple hospitalizations and falls. Up to PT/OT evaluations, pt choice and insurance authorization. I remain available in the outpatient space to assist pt as needed.  ? ?Westley Hummer, MSW, LCSW ?Clinical Social Worker II ?[facility] Navigation  ?[phone]- work cell phone (preferred) ?[phone]- desk phone' ?

## 2021-10-22 ENCOUNTER — Encounter (HOSPITAL_COMMUNITY): Payer: Self-pay | Admitting: Critical Care Medicine

## 2021-10-22 ENCOUNTER — Encounter (HOSPITAL_COMMUNITY)
Admission: EM | Disposition: A | Discharge status: Skilled Nursing Facility | Payer: Self-pay | Source: Home / Self Care | Attending: Internal Medicine

## 2021-10-22 ENCOUNTER — Encounter (HOSPITAL_COMMUNITY): Payer: Self-pay | Admitting: Internal Medicine

## 2021-10-22 DIAGNOSIS — D62 Acute posthemorrhagic anemia: Secondary | ICD-10-CM | POA: Diagnosis not present

## 2021-10-22 DIAGNOSIS — K921 Melena: Secondary | ICD-10-CM

## 2021-10-22 DIAGNOSIS — H538 Other visual disturbances: Secondary | ICD-10-CM

## 2021-10-22 LAB — GLUCOSE, CAPILLARY
Glucose-Capillary: 224 mg/dL — ABNORMAL HIGH (ref 70–99)
Glucose-Capillary: 203 mg/dL — ABNORMAL HIGH (ref 70–99)
Glucose-Capillary: 149 mg/dL — ABNORMAL HIGH (ref 70–99)
Glucose-Capillary: 134 mg/dL — ABNORMAL HIGH (ref 70–99)

## 2021-10-22 LAB — CBC
HCT: 16.9 % — ABNORMAL LOW (ref 39.0–52.0)
Hemoglobin: 5.5 g/dL — CL (ref 13.0–17.0)
MCH: 27.6 pg (ref 26.0–34.0)
MCHC: 32.5 g/dL (ref 30.0–36.0)
MCV: 84.9 fL (ref 80.0–100.0)
Platelets: 189 10*3/uL (ref 150–400)
RBC: 1.99 MIL/uL — ABNORMAL LOW (ref 4.22–5.81)
RDW: 17.4 % — ABNORMAL HIGH (ref 11.5–15.5)
WBC: 7.4 10*3/uL (ref 4.0–10.5)
nRBC: 0 % (ref 0.0–0.2)

## 2021-10-22 LAB — HEMOGLOBIN AND HEMATOCRIT, BLOOD
HCT: 22.7 % — ABNORMAL LOW (ref 39.0–52.0)
HCT: 22.9 % — ABNORMAL LOW (ref 39.0–52.0)
HCT: 23.1 % — ABNORMAL LOW (ref 39.0–52.0)
Hemoglobin: 7.7 g/dL — ABNORMAL LOW (ref 13.0–17.0)
Hemoglobin: 7.5 g/dL — ABNORMAL LOW (ref 13.0–17.0)
Hemoglobin: 7.7 g/dL — ABNORMAL LOW (ref 13.0–17.0)

## 2021-10-22 LAB — BASIC METABOLIC PANEL
Anion gap: 11 (ref 5–15)
BUN: 109 mg/dL — ABNORMAL HIGH (ref 8–23)
CO2: 19 mmol/L — ABNORMAL LOW (ref 22–32)
Calcium: 7.9 mg/dL — ABNORMAL LOW (ref 8.9–10.3)
Chloride: 104 mmol/L (ref 98–111)
Creatinine, Ser: 3.79 mg/dL — ABNORMAL HIGH (ref 0.61–1.24)
GFR, Estimated: 17 mL/min — ABNORMAL LOW (ref >60)
Glucose, Bld: 251 mg/dL — ABNORMAL HIGH (ref 70–99)
Potassium: 4.4 mmol/L (ref 3.5–5.1)
Sodium: 134 mmol/L — ABNORMAL LOW (ref 135–145)

## 2021-10-22 LAB — PREPARE RBC (CROSSMATCH)

## 2021-10-22 SURGERY — CANCELLED PROCEDURE

## 2021-10-22 MED ORDER — GUAIFENESIN-DM 100-10 MG/5ML PO SYRP
5.0000 mL | ORAL_SOLUTION | ORAL | Status: DC | PRN
  Administered 2021-10-22 – 2021-10-28 (×7): 5 mL via ORAL
  Filled 2021-10-22 (×7): qty 5

## 2021-10-22 MED ORDER — PANTOPRAZOLE SODIUM 40 MG IV SOLR
40.0000 mg | Freq: Two times a day (BID) | INTRAVENOUS | Status: DC
Start: 2021-10-22 — End: 2021-10-24
  Administered 2021-10-22 – 2021-10-24 (×5): 40 mg via INTRAVENOUS
  Filled 2021-10-22 (×5): qty 10

## 2021-10-22 MED ORDER — FUROSEMIDE 10 MG/ML IJ SOLN
80.0000 mg | Freq: Two times a day (BID) | INTRAMUSCULAR | Status: AC
  Administered 2021-10-22 (×2): 80 mg via INTRAVENOUS
  Filled 2021-10-22 (×2): qty 8

## 2021-10-22 MED ORDER — SODIUM CHLORIDE 0.9% IV SOLUTION: Freq: Once | INTRAVENOUS | Status: DC

## 2021-10-22 MED ORDER — ISOSORBIDE MONONITRATE ER 30 MG PO TB24
15.0000 mg | ORAL_TABLET | Freq: Every day | ORAL | Status: DC
  Administered 2021-10-22 – 2021-10-28 (×7): 15 mg via ORAL
  Filled 2021-10-22 (×7): qty 1

## 2021-10-22 SURGICAL SUPPLY — 15 items
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
## 2021-10-22 NOTE — H&P (View-Only) (Signed)
I evaluated [name] in the endoscopy preprocedure area. ? ?His transfusions are complete and hemoglobin 8.2 on i-STAT. ? ?Blood pressure 170s over 80s, pulse in the 80s.  His oxygen saturation is 100% on room air, though he has a prolonged expiratory phase with inspiratory crackles and fine expiratory wheezing on exam. ?This is worrisome for pulmonary edema. ? ?In addition, anesthesia team is not currently available due to emergency cases in the OR, thus our procedure was going to be put off for an unknown period of time. ? ?I have decided to send him back up to his room to get his scheduled dose of furosemide 80 mg IV, he will continue on a Protonix drip and be n.p.o. with serial hemoglobin and hematocrit checks and transfusion as needed. ?I discussed it with Dr. Broadus John of the [facility] and she was in agreement. ? ?Last, I will discuss him with my partner Dr. Ardis Hughs who is on-call tonight in case there is brisk GI bleeding with hemodynamic instability requiring overnight endoscopy.  Otherwise, we have put him on for the first case tomorrow morning. ? ? - Wilfrid Lund, MD ?   [facility] ? ?

## 2021-10-22 NOTE — Progress Notes (Signed)
Pt presented to endoscopy today for EGD for GI bleeding. Upon initial assessment, pt noted to be dyspneic. Lung sounds included crackles bilaterally. MD Danis notified. Per MD Danis, pt remains hemodynamically stable at this time, cancel EGD d/t impaired respiratory status. Pt to be rescheduled for 0730 tomorrow. ?
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
## 2021-10-22 NOTE — Progress Notes (Signed)
[name] is hemodynamically stable at present and currently receiving a unit of PRBCs. ? ?I spoke with him about the upper endoscopy planned for later today, and he was agreeable with understanding of risks and benefits ? The benefits and risks of the planned procedure were described in detail with the patient or (when appropriate) their health care proxy.  Risks were outlined as including, but not limited to, bleeding, infection, perforation, adverse medication reaction leading to cardiac or pulmonary decompensation, pancreatitis (if ERCP).  The limitation of incomplete mucosal visualization was also discussed.  No guarantees or warranties were given. ? ?He has undergone this procedure multiple times before and is familiar with it.  He understands this is a higher risk scenario than average given the recent RFA treatment and the acute bleeding. ? ?He also asked that I speak with his caregiver [name] with an update, which I did by phone. ? ? - Wilfrid Lund, MD ?   [facility] ? ?

## 2021-10-22 NOTE — Progress Notes (Signed)
Inpatient Diabetes Program Recommendations ? ?AACE/ADA: New Consensus Statement on Inpatient Glycemic Control (2015) ? ?Target Ranges:  Prepandial:   less than 140 mg/dL ?     Peak postprandial:   less than 180 mg/dL (1-2 hours) ?     Critically ill patients:  140 - 180 mg/dL  ? ?Lab Results  ?Component Value Date  ? GLUCAP 203 (H) 10/22/2021  ? HGBA1C 6.4 (H) 07/25/2021  ? ? ?Review of Glycemic Control ? ?Diabetes history: Type 2 DM ?Outpatient Diabetes medications: Ozempic 1 mg qwk (recently DCd by Dr Nehemiah Settle) ?Current orders for Inpatient glycemic control: Novolog 0-15 units TID ? ?Inpatient Diabetes Program Recommendations:   ?Noted that blood sugars continue to be greater than 180 mg/dl.  ? ?Recommend adding low dose basal insulin Semglee 10 units daily if blood sugars continue to be elevated. ? ?Harvel Ricks RN BSN CDE ?Diabetes Coordinator ?Pager: [phone]  8am-5pm  ? ? ? ? ?

## 2021-10-22 NOTE — Progress Notes (Signed)
I evaluated [name] in the endoscopy preprocedure area. ? ?His transfusions are complete and hemoglobin 8.2 on i-STAT. ? ?Blood pressure 170s over 80s, pulse in the 80s.  His oxygen saturation is 100% on room air, though he has a prolonged expiratory phase with inspiratory crackles and fine expiratory wheezing on exam. ?This is worrisome for pulmonary edema. ? ?In addition, anesthesia team is not currently available due to emergency cases in the OR, thus our procedure was going to be put off for an unknown period of time. ? ?I have decided to send him back up to his room to get his scheduled dose of furosemide 80 mg IV, he will continue on a Protonix drip and be n.p.o. with serial hemoglobin and hematocrit checks and transfusion as needed. ?I discussed it with Dr. Broadus John of the [facility] service and she was in agreement. ? ?Last, I will discuss him with my partner Dr. Ardis Hughs who is on-call tonight in case there is brisk GI bleeding with hemodynamic instability requiring overnight endoscopy.  Otherwise, we have put him on for the first case tomorrow morning. ? ? - Wilfrid Lund, MD ?   [facility] ? ?

## 2021-10-22 NOTE — Consult Note (Signed)
? ?  [facility] CM Inpatient Consult ? ? ?10/22/2021 ? ?[name] ?25-May-1956 ?[id number] ? ?[facility] [ACO] Patient: [employer] ? ?Primary Care Provider:  [name], MD is an embedded provider at [facility] with a Chronic Care Management team and program, and is listed for the transition of care follow up and appointments. ? ?Patient was screened for extreme high risk scores for unplanned readmission Embedded practice service needs for chronic care management and patient is currently being recommended for a skilled nursing facility level of care.  Hx with HH [[facility]]. Awaiting  PT/OT eval and Insurance approval. ?Came by to speak with patient and he was in the mist of his ADLs /patient care. ? ?Plan: Will continue to follow for post hospital needs.  ? ?Please contact for further questions, ? ?Natividad Brood, RN BSN CCM ?[facility] Liaison ? [phone] business mobile phone ?Toll free office [phone]  ?Fax number: [phone] ?[email or web address] ? ? ? ?

## 2021-10-22 NOTE — Clinical Note (Incomplete)
Hgb 5.5, MD notified and ordered 2 units of RBC, will continue to monitor, Thanks Arvella Nigh RN. ?

## 2021-10-22 NOTE — Progress Notes (Signed)
Pt states his Left eye is completely blurry and was normal a few hours ago.  Both eyes reactive.  No other neuro symptoms upon assessment.  MD notified and present in room.   ?

## 2021-10-23 ENCOUNTER — Inpatient Hospital Stay (HOSPITAL_COMMUNITY): Payer: Medicare HMO | Admitting: Certified Registered"

## 2021-10-23 ENCOUNTER — Encounter (HOSPITAL_COMMUNITY): Payer: Self-pay | Admitting: Internal Medicine

## 2021-10-23 ENCOUNTER — Encounter (HOSPITAL_COMMUNITY)
Admission: EM | Disposition: A | Discharge status: Skilled Nursing Facility | Payer: Self-pay | Source: Home / Self Care | Attending: Internal Medicine

## 2021-10-23 ENCOUNTER — Inpatient Hospital Stay (HOSPITAL_COMMUNITY): Payer: Medicare HMO

## 2021-10-23 DIAGNOSIS — K317 Polyp of stomach and duodenum: Secondary | ICD-10-CM

## 2021-10-23 DIAGNOSIS — K222 Esophageal obstruction: Secondary | ICD-10-CM

## 2021-10-23 DIAGNOSIS — D62 Acute posthemorrhagic anemia: Secondary | ICD-10-CM | POA: Diagnosis not present

## 2021-10-23 DIAGNOSIS — I2510 Atherosclerotic heart disease of native coronary artery without angina pectoris: Secondary | ICD-10-CM

## 2021-10-23 DIAGNOSIS — K921 Melena: Secondary | ICD-10-CM | POA: Diagnosis not present

## 2021-10-23 HISTORY — PX: ESOPHAGOGASTRODUODENOSCOPY (EGD) WITH PROPOFOL: SHX5813

## 2021-10-23 LAB — GLUCOSE, CAPILLARY
Glucose-Capillary: 138 mg/dL — ABNORMAL HIGH (ref 70–99)
Glucose-Capillary: 136 mg/dL — ABNORMAL HIGH (ref 70–99)
Glucose-Capillary: 138 mg/dL — ABNORMAL HIGH (ref 70–99)
Glucose-Capillary: 197 mg/dL — ABNORMAL HIGH (ref 70–99)
Glucose-Capillary: 238 mg/dL — ABNORMAL HIGH (ref 70–99)
Glucose-Capillary: 253 mg/dL — ABNORMAL HIGH (ref 70–99)

## 2021-10-23 LAB — BPAM RBC
Blood Product Expiration Date: 202305152359
Blood Product Expiration Date: 202305292359
Blood Product Expiration Date: 202306032359
Blood Product Expiration Date: 202306052359
ISSUE DATE / TIME: 202305111128
ISSUE DATE / TIME: 202305111457
ISSUE DATE / TIME: 202305120700
ISSUE DATE / TIME: 202305121148
Unit Type and Rh: 7300
Unit Type and Rh: 7300
Unit Type and Rh: 7300
Unit Type and Rh: 9500

## 2021-10-23 LAB — TYPE AND SCREEN
ABO/RH(D): B POS
Antibody Screen: NEGATIVE
Unit division: 0
Unit division: 0
Unit division: 0
Unit division: 0

## 2021-10-23 LAB — BASIC METABOLIC PANEL
Anion gap: 11 (ref 5–15)
BUN: 102 mg/dL — ABNORMAL HIGH (ref 8–23)
CO2: 20 mmol/L — ABNORMAL LOW (ref 22–32)
Calcium: 8.4 mg/dL — ABNORMAL LOW (ref 8.9–10.3)
Chloride: 106 mmol/L (ref 98–111)
Creatinine, Ser: 3.96 mg/dL — ABNORMAL HIGH (ref 0.61–1.24)
GFR, Estimated: 16 mL/min — ABNORMAL LOW (ref >60)
Glucose, Bld: 129 mg/dL — ABNORMAL HIGH (ref 70–99)
Potassium: 4 mmol/L (ref 3.5–5.1)
Sodium: 137 mmol/L (ref 135–145)

## 2021-10-23 LAB — CBC
HCT: 23 % — ABNORMAL LOW (ref 39.0–52.0)
Hemoglobin: 7.4 g/dL — ABNORMAL LOW (ref 13.0–17.0)
MCH: 27.4 pg (ref 26.0–34.0)
MCHC: 32.2 g/dL (ref 30.0–36.0)
MCV: 85.2 fL (ref 80.0–100.0)
Platelets: 187 10*3/uL (ref 150–400)
RBC: 2.7 MIL/uL — ABNORMAL LOW (ref 4.22–5.81)
RDW: 17.2 % — ABNORMAL HIGH (ref 11.5–15.5)
WBC: 6.4 10*3/uL (ref 4.0–10.5)
nRBC: 0 % (ref 0.0–0.2)

## 2021-10-23 LAB — MAGNESIUM: Magnesium: 2.1 mg/dL (ref 1.7–2.4)

## 2021-10-23 SURGERY — ESOPHAGOGASTRODUODENOSCOPY (EGD) WITH PROPOFOL
Anesthesia: Monitor Anesthesia Care

## 2021-10-23 MED ORDER — LIDOCAINE 2% (20 MG/ML) 5 ML SYRINGE
INTRAMUSCULAR | Status: DC | PRN
  Administered 2021-10-23: 60 mg via INTRAVENOUS

## 2021-10-23 MED ORDER — SUCRALFATE 1 GM/10ML PO SUSP: 1.0000 g | Freq: Three times a day (TID) | ORAL | Status: DC

## 2021-10-23 MED ORDER — PROPOFOL 10 MG/ML IV BOLUS
INTRAVENOUS | Status: DC | PRN
  Administered 2021-10-23 (×2): 10 mg via INTRAVENOUS

## 2021-10-23 MED ORDER — BOOST / RESOURCE BREEZE PO LIQD CUSTOM
1.0000 | Freq: Three times a day (TID) | ORAL | Status: DC
  Administered 2021-10-23 – 2021-10-28 (×8): 1 via ORAL

## 2021-10-23 MED ORDER — PROPOFOL 500 MG/50ML IV EMUL
INTRAVENOUS | Status: DC | PRN
  Administered 2021-10-23: 150 ug/kg/min via INTRAVENOUS

## 2021-10-23 MED ORDER — HYDRALAZINE HCL 50 MG PO TABS
50.0000 mg | ORAL_TABLET | Freq: Three times a day (TID) | ORAL | Status: DC
  Administered 2021-10-23 – 2021-10-28 (×16): 50 mg via ORAL
  Filled 2021-10-23 (×16): qty 1

## 2021-10-23 SURGICAL SUPPLY — 15 items

## 2021-10-23 NOTE — Anesthesia Preprocedure Evaluation (Addendum)
Anesthesia Evaluation  ?Patient identified by MRN, date of birth, ID band ?Patient awake ? ? ? ?Reviewed: ?Allergy & Precautions, NPO status , Patient's Chart, lab work & pertinent test results ? ?History of Anesthesia Complications ?Negative for: history of anesthetic complications ? ?Airway ?Mallampati: II ? ?TM Distance: >3 FB ?Neck ROM: Full ? ? ? Dental ? ?(+) Missing, Dental Advisory Given, Poor Dentition ?  ?Pulmonary ?sleep apnea , COPD,  COPD inhaler, former smoker,  ?  ?breath sounds clear to auscultation ? ? ? ? ? ? Cardiovascular ?hypertension, Pt. on medications ?pulmonary hypertension(-) angina+ CAD, + Past MI, + CABG and +CHF  ? ?Rhythm:Regular Rate:Normal ? ?07/2021 ECHO: EF 40 to 45%. The LV has mildly decreased function. mild concentric LVH, akinesis of the left ventricular, basal inferior wall and inferoseptal wall.  ?RVF is moderately reduced. The right ventricular size is severely enlarged. There is moderately elevated  ?pulmonary artery systolic pressure, mild-mod MR ? ?02/2021 Stress: Moderate size and intensity, mostly fixed basal to mid inferior and inferolateral perfusion defect (SDS 1), suggestive of scar with minimal peri-infarct ischemia. LVEF 28% with global hypokinesis and basal to mid inferior akinesis. This is a high risk study ?  ?Neuro/Psych ?Anxiety Depression CVA, No Residual Symptoms   ? GI/Hepatic ?Neg liver ROS, GERD  Medicated and Controlled,  ?Endo/Other  ?diabetes (glu 136), Insulin Dependent, Oral Hypoglycemic Agents ? Renal/GU ?Renal InsufficiencyRenal disease  ? ?  ?Musculoskeletal ? ? Abdominal ?  ?Peds ? Hematology ? ?(+) Blood dyscrasia (Hb 7.4, plt 187k), anemia ,   ?Anesthesia Other Findings ? ? Reproductive/Obstetrics ? ?  ? ? ? ? ? ? ? ? ? ? ? ? ? ?  ?  ? ? ? ? ? ? ? ?Anesthesia Physical ?Anesthesia Plan ? ?ASA: 4 ? ?Anesthesia Plan: MAC  ? ?Post-op Pain Management: Minimal or no pain anticipated  ? ?Induction:  ? ?PONV Risk Score  and Plan: 1 and Treatment may vary due to age or medical condition ? ?Airway Management Planned: Natural Airway and Nasal Cannula ? ?Additional Equipment: None ? ?Intra-op Plan:  ? ?Post-operative Plan:  ? ?Informed Consent: I have reviewed the patients History and Physical, chart, labs and discussed the procedure including the risks, benefits and alternatives for the proposed anesthesia with the patient or authorized representative who has indicated his/her understanding and acceptance.  ? ? ? ?Dental advisory given ? ?Plan Discussed with: CRNA and Surgeon ? ?Anesthesia Plan Comments:   ? ? ? ? ? ?Anesthesia Quick Evaluation ? ?

## 2021-10-23 NOTE — Significant Event (Addendum)
Rapid Response Event Note  ? ?Reason for Call : slurred speech and weakness ? ?Initial Focused Assessment: On arrival, patient is easily arousable, and oriented x4. Bedside RN said when NT entered room to get blood sugar, pt became minimally responsive very briefly and then had a few second run of SVT. By the time RN entered the room, pt was back in NSR, which was confirmed by 12-lead EKG.  ? ?Pt exhibits very mild left face droop, but no other stroke symptoms. Pupils are a 3 and sluggish bilaterally. He had a previous CVA in July 2022 but states no residuals. He has generalized weakness throughout. He has history of CKD and arrived back from a procedure this morning after receiving propofol and fentanyl, which MD is attributing to drowsiness and weakness. ? ?Skin warm and dry. Lungs clear throughout. Abdomen is soft. Pt does not endorse pain.  ? ?VS: T 98.1, BP 129/72, HR 73, RR 13, O2 95% on RA. ?CBG: 197 ? ?Interventions:  ?-CT Head STAT per MD ? ?Plan of Care:  ?-Continue to monitor drowsiness ?-MD ordered a magnesium level to be drawn. ? ?Please notify RRN for any additional needs. ? ?Event Summary:  ? ?MD Notified: Dr. Broadus John at bedside ?Call Time: 1123 ?Arrival Time: 8003 ?End Time: 1226 ? ?Fulton Reek, RN ?

## 2021-10-23 NOTE — Progress Notes (Signed)
Pharmacist Heart Failure Core Measure Documentation ? ?Assessment: ?[name] has an EF documented as 40-45% on 07/25/2021 by Dr. Radford Pax. ? ?Rationale: Heart failure patients with left ventricular systolic dysfunction (LVSD) and an EF < 40% should be prescribed an angiotensin converting enzyme inhibitor (ACEI) or angiotensin receptor blocker (ARB) at discharge unless a contraindication is documented in the medical record. ? ?This patient is not currently on an ACEI or ARB for HF. ? ?This note is being placed in the record in order to provide documentation that a contraindication to the use of these agents is present for this encounter. ? ?ACE Inhibitor or Angiotensin Receptor Blocker is contraindicated ?(specify all that apply) ? ?'[]'$   ACEI allergy AND ARB allergy ?'[]'$   Angioedema ?'[]'$   Moderate or severe aortic stenosis ?'[]'$   Hyperkalemia ?'[]'$   Hypotension ?'[]'$   Renal artery stenosis ?'[x]'$   Worsening renal function, preexisting renal disease or dysfunction ? ? ?Aleene Davidson 10/23/2021 1:57 PM  ?

## 2021-10-23 NOTE — Op Note (Signed)
[facility] ?Patient Name: [name] ?Procedure Date : 10/23/2021 ?MRN: [id number] ?Attending MD: Estill Cotta. Loletha Carrow , MD ?Date of Birth: [birth year] ?CSN: [id number] ?Age: 66 ?Admit Type: Inpatient ?Procedure:                Upper GI endoscopy ?Indications:              Acute post hemorrhagic anemia, Melena ?                          EGD with RFA of Barrett's esophagus at [facility] 09/27/21  ?                          - intermittent melena and recurrent acute on  ?                          chronic anemia cotinue this hospitalization ?                          Bleeding was presumed to be from post-treatment  ?                          esophagitis ?Providers:                Estill Cotta. Loletha Carrow, MD, Doristine Johns, RN, Alphonzo Grieve  ?                          Leighton Roach, Technician ?Referring MD:             [facility] ?Medicines:                Monitored Anesthesia Care ?Complications:            No immediate complications. ?Estimated Blood Loss:     Estimated blood loss: none. ?Procedure:                Pre-Anesthesia Assessment: ?                          - Prior to the procedure, a History and Physical  ?                          was performed, and patient medications and  ?                          allergies were reviewed. The patient's tolerance of  ?                          previous anesthesia was also reviewed. The risks  ?                          and benefits of the procedure and the sedation  ?                          options and risks were discussed with the patient.  ?                          All questions were  answered, and informed consent  ?                          was obtained. Prior Anticoagulants: The patient has  ?                          taken no previous anticoagulant or antiplatelet  ?                          agents. ASA Grade Assessment: IV - A patient with  ?                          severe systemic disease that is a constant threat  ?                          to life. After reviewing  the risks and benefits,  ?                          the patient was deemed in satisfactory condition to  ?                          undergo the procedure. ?                          After obtaining informed consent, the endoscope was  ?                          passed under direct vision. Throughout the  ?                          procedure, the patient's blood pressure, pulse, and  ?                          oxygen saturations were monitored continuously. The  ?                          [id number]-[id number] ([id number]) Olympus endoscope was introduced  ?                          through the mouth, and advanced to the second part  ?                          of duodenum. The upper GI endoscopy was  ?                          accomplished without difficulty. The patient  ?                          tolerated the procedure well. ?Scope In: ?Scope Out: ?Findings: ?     One benign-appearing, intrinsic moderate stenosis with circumferential  ?     clean-based ulceration was found in the distal esophagus (approx.  ?     34-37cm). This stenosis measured 1 cm (inner diameter) x 3 cm (in  ?     length). The  stenosis was traversed. (just able to be traversed with  ?     adult EGD scope). Small area of friable mucosa within proximal stricture  ?     - noted on withdrawal from scope trauma. ?     A single sessile fundic gland polyp was found in the gastric fundus. ?     The exam of the stomach was otherwise normal. ?     The examined duodenum was normal. Clear bile. ?Impression:               - Benign-appearing esophageal stenosis. ?                          - A single fundic gland polyp. ?                          - Normal examined duodenum. ?                          - No specimens collected. ?Recommendation:           - Return patient to hospital ward for ongoing care. ?                          - Full liquid diet and liquid protein calorie  ?                          supplements ?                          - Twice daily IV Pantoprazole ?                           Carafate three times daily. ?                          Head of bed elevated at all times. ?                          If patient continues to pass melena and drop  ?                          hemoglobin, colonoscopy will be warranted. ?                          (neither SB enteroscopy now VCE can be performed  ?                          due to the narrow esophageal lumen from the  ?                          ulcerated esophageal stricture) ?Procedure Code(s):        --- Professional --- ?                          [phone], Esophagogastroduodenoscopy, flexible,  ?  transoral; diagnostic, including collection of  ?                          specimen(s) by brushing or washing, when performed  ?                          (separate procedure) ?Diagnosis Code(s):        --- Professional --- ?                          K22.2, Esophageal obstruction ?                          K31.7, Polyp of stomach and duodenum ?                          D62, Acute posthemorrhagic anemia ?                          K92.1, Melena (includes Hematochezia) ?CPT copyright 2019 American Medical Association. All rights reserved. ?The codes documented in this report are preliminary and upon coder review may  ?be revised to meet current compliance requirements. ?Carlous Olivares L. Loletha Carrow, MD ?10/23/2021 8:10:23 AM ?This report has been signed electronically. ?Number of Addenda: 0 ?

## 2021-10-23 NOTE — Transfer of Care (Signed)
Immediate Anesthesia Transfer of Care Note ? ?Patient: [name] ? ?Procedure(s) Performed: ESOPHAGOGASTRODUODENOSCOPY (EGD) WITH PROPOFOL ? ?Patient Location: PACU ? ?Anesthesia Type:MAC ? ?Level of Consciousness: sedated, drowsy and patient cooperative ? ?Airway & Oxygen Therapy: Patient Spontanous Breathing ? ?Post-op Assessment: Report given to RN and Post -op Vital signs reviewed and stable ? ?Post vital signs: Reviewed and stable ? ?Last Vitals:  ?Vitals Value Taken Time  ?BP    ?Temp    ?Pulse    ?Resp    ?SpO2    ? ? ?Last Pain:  ?Vitals:  ? 10/23/21 0716  ?TempSrc: Temporal  ?PainSc: 0-No pain  ?   ? ?  ? ?Complications: No notable events documented. ?

## 2021-10-23 NOTE — TOC Progression Note (Signed)
Transition of Care (TOC) - Progression Note  ? ? ?Patient Details  ?Name: [name] ?MRN: [id number] ?Date of Birth: 29-Mar-1956 ? ?Transition of Care (TOC) CM/SW Contact  ?Bary Castilla, LCSW ?Phone Number: [phone] ?10/23/2021, 2:37 PM ? ?Clinical Narrative:    ? ?CSW met with pt to provide the 2 bed offers for [facility] and [facility]. Pt explained that he really wanted to go to a facility in [place]. CSW let pt know that [facility] did not offer a bed. [facility] was discussed and pt was informed that bed offer was still pending. Pt asked CSW to call caregiver [name]. CSW spoke with [facility] and she was provided the bed offers as well. [name] stated that she wanted to discuss with pt. CSW let her know that a follow up call would occur tomorrow. ? ?TOC team will continue to assist with discharge planning needs.  ? ? ?Expected Discharge Plan: [place] ?Barriers to Discharge: Continued Medical Work up, SNF Pending bed offer, Insurance Authorization ? ?Expected Discharge Plan and Services ?Expected Discharge Plan: [place] ?  ?  ?Post Acute Care Choice: [place] ?Living arrangements for the past 2 months: [place] ?                ?  ?  ?  ?  ?  ?  ?  ?  ?  ?  ? ? ?Social Determinants of Health ([name]) Interventions ?  ? ?Readmission Risk Interventions ? ?  08/30/2021  ?  2:40 PM 07/26/2021  ?  8:24 AM 03/29/2019  ? 10:03 AM  ?Readmission Risk Prevention Plan  ?Transportation Screening Complete Complete Complete  ?PCP or Specialist Appt within 5-7 Days   Complete  ?Home Care Screening   Complete  ?Medication Review (RN CM)   Complete  ?[place] or Home Care Consult  Complete   ?Social Work Consult for [place] Planning/Counseling  Complete   ?Palliative Care Screening  Not Applicable   ?Medication Review Press photographer) Complete Complete   ?PCP or Specialist appointment within 3-5 days of discharge Not Complete    ?[place] or Home Care Consult  Complete    ?SW Recovery Care/Counseling Consult Complete    ?Palliative Care Screening Not Applicable    ?[place] Not Applicable    ? ? ?

## 2021-10-23 NOTE — Progress Notes (Signed)
Pt refuses CPAP for tonight.   
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
## 2021-10-23 NOTE — Anesthesia Postprocedure Evaluation (Signed)
Anesthesia Post Note ? ?Patient: [name] ? ?Procedure(s) Performed: ESOPHAGOGASTRODUODENOSCOPY (EGD) WITH PROPOFOL ? ?  ? ?Patient location during evaluation: PACU ?Anesthesia Type: MAC ?Level of consciousness: awake and alert, patient cooperative and oriented ?Pain management: pain level controlled ?Vital Signs Assessment: post-procedure vital signs reviewed and stable ?Respiratory status: spontaneous breathing, nonlabored ventilation and respiratory function stable ?Cardiovascular status: stable and blood pressure returned to baseline ?Postop Assessment: no apparent nausea or vomiting ?Anesthetic complications: no ? ? ?No notable events documented. ? ?Last Vitals:  ?Vitals:  ? 10/23/21 0800 10/23/21 0815  ?BP: (!) 120/59 140/72  ?Pulse: 70 72  ?Resp: (!) 9 10  ?Temp: 37.1 ?C 37.1 ?C  ?SpO2: 97% 97%  ?  ?Last Pain:  ?Vitals:  ? 10/23/21 0815  ?TempSrc:   ?PainSc: 0-No pain  ? ? ?  ?  ?  ?  ?  ?  ? ?Shahzad Thomann,E. Kenedy Haisley ? ? ? ? ?

## 2021-10-23 NOTE — Interval H&P Note (Signed)
History and Physical Interval Note: ? ?10/23/2021 ?7:31 AM ? ?[name]  has presented today for surgery, with the diagnosis of GI BLEED.  The various methods of treatment have been discussed with the patient and family. After consideration of risks, benefits and other options for treatment, the patient has consented to  Procedure(s): ?ESOPHAGOGASTRODUODENOSCOPY (EGD) WITH PROPOFOL (N/A) as a surgical intervention.  The patient's history has been reviewed, patient examined, no change in status, stable for surgery.  I have reviewed the patient's chart and labs.  Questions were answered to the patient's satisfaction.   ? ?Patient hemodynamically stable, oxygenating well.  Breathing comfortably on room air. ?BUN has become more elevated, creatinine stable.  Hemoglobin stable at 7.4 overnight. ? ?Proceeding with upper endoscopy. ? ?[name] ? ? ?

## 2021-10-24 DIAGNOSIS — Z7901 Long term (current) use of anticoagulants: Secondary | ICD-10-CM

## 2021-10-24 DIAGNOSIS — K2210 Ulcer of esophagus without bleeding: Secondary | ICD-10-CM | POA: Diagnosis not present

## 2021-10-24 DIAGNOSIS — D62 Acute posthemorrhagic anemia: Secondary | ICD-10-CM | POA: Diagnosis not present

## 2021-10-24 DIAGNOSIS — K921 Melena: Secondary | ICD-10-CM | POA: Diagnosis not present

## 2021-10-24 LAB — GLUCOSE, CAPILLARY
Glucose-Capillary: 162 mg/dL — ABNORMAL HIGH (ref 70–99)
Glucose-Capillary: 200 mg/dL — ABNORMAL HIGH (ref 70–99)
Glucose-Capillary: 287 mg/dL — ABNORMAL HIGH (ref 70–99)
Glucose-Capillary: 192 mg/dL — ABNORMAL HIGH (ref 70–99)

## 2021-10-24 LAB — CBC
HCT: 24.3 % — ABNORMAL LOW (ref 39.0–52.0)
HCT: 23 % — ABNORMAL LOW (ref 39.0–52.0)
Hemoglobin: 7.7 g/dL — ABNORMAL LOW (ref 13.0–17.0)
Hemoglobin: 7.2 g/dL — ABNORMAL LOW (ref 13.0–17.0)
MCH: 27.7 pg (ref 26.0–34.0)
MCH: 27.3 pg (ref 26.0–34.0)
MCHC: 31.7 g/dL (ref 30.0–36.0)
MCHC: 31.3 g/dL (ref 30.0–36.0)
MCV: 87.4 fL (ref 80.0–100.0)
MCV: 87.1 fL (ref 80.0–100.0)
Platelets: 215 10*3/uL (ref 150–400)
Platelets: 201 10*3/uL (ref 150–400)
RBC: 2.78 MIL/uL — ABNORMAL LOW (ref 4.22–5.81)
RBC: 2.64 MIL/uL — ABNORMAL LOW (ref 4.22–5.81)
RDW: 17.2 % — ABNORMAL HIGH (ref 11.5–15.5)
RDW: 17.6 % — ABNORMAL HIGH (ref 11.5–15.5)
WBC: 6.4 10*3/uL (ref 4.0–10.5)
WBC: 6.2 10*3/uL (ref 4.0–10.5)
nRBC: 0 % (ref 0.0–0.2)
nRBC: 0.3 % — ABNORMAL HIGH (ref 0.0–0.2)

## 2021-10-24 LAB — BASIC METABOLIC PANEL
Anion gap: 10 (ref 5–15)
BUN: 92 mg/dL — ABNORMAL HIGH (ref 8–23)
CO2: 21 mmol/L — ABNORMAL LOW (ref 22–32)
Calcium: 8.3 mg/dL — ABNORMAL LOW (ref 8.9–10.3)
Chloride: 105 mmol/L (ref 98–111)
Creatinine, Ser: 4.16 mg/dL — ABNORMAL HIGH (ref 0.61–1.24)
GFR, Estimated: 15 mL/min — ABNORMAL LOW (ref >60)
Glucose, Bld: 191 mg/dL — ABNORMAL HIGH (ref 70–99)
Potassium: 4 mmol/L (ref 3.5–5.1)
Sodium: 136 mmol/L (ref 135–145)

## 2021-10-24 MED ORDER — PANTOPRAZOLE SODIUM 40 MG PO TBEC
40.0000 mg | DELAYED_RELEASE_TABLET | Freq: Two times a day (BID) | ORAL | Status: DC
  Administered 2021-10-24 – 2021-10-28 (×8): 40 mg via ORAL
  Filled 2021-10-24 (×9): qty 1
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
## 2021-10-24 NOTE — Plan of Care (Signed)
  Problem: Education: Goal: Knowledge of General Education information will improve Description Including pain rating scale, medication(s)/side effects and non-pharmacologic comfort measures Outcome: Progressing   Problem: Health Behavior/Discharge Planning: Goal: Ability to manage health-related needs will improve Outcome: Progressing   

## 2021-10-24 NOTE — Progress Notes (Signed)
Pt refuses CPAP again tonight.  

## 2021-10-24 NOTE — Progress Notes (Addendum)
[facility] Progress Note ? ?Chief Complaint: Upper GI bleeding, ulcerative esophagitis ? ?History: ? ?[name] looks and feels much better today and his bleeding is stopped.  He is on a full liquid diet and would like to return to soft diet of regular food if we think it is appropriate.  At home he was only having food of a mashed potato consistency and thinks he will continue that for the time being. ?He has no further melena no hematemesis and his hemoglobin is stable overnight. ? ?ROS: ?Cardiovascular: Denies chest ?Respiratory: Breathing comfortable ?Urinary: Denies dysuria ? ?Objective: ? ? ?Current Facility-Administered Medications:  ?  0.9 %  sodium chloride infusion (Manually program via Guardrails IV Fluids), , Intravenous, Once, Danis, Estill Cotta III, MD ?  acetaminophen (TYLENOL) tablet 650 mg, 650 mg, Oral, Q6H PRN, Danis, Estill Cotta III, MD ?  albuterol (PROVENTIL) (2.5 MG/3ML) 0.083% nebulizer solution 3 mL, 3 mL, Inhalation, Q6H PRN, [name], MD ?  carvedilol (COREG) tablet 12.5 mg, 12.5 mg, Oral, BID, [name], MD, 12.5 mg at 10/24/21 0858 ?  doxazosin (CARDURA) tablet 4 mg, 4 mg, Oral, Daily, Danis, Estill Cotta III, MD, 4 mg at 10/24/21 0858 ?  feeding supplement (BOOST / RESOURCE BREEZE) liquid 1 Container, 1 Container, Oral, TID BM, Doran Stabler, MD, 1 Container at 10/24/21 1222 ?  guaiFENesin-dextromethorphan (ROBITUSSIN DM) 100-10 MG/5ML syrup 5 mL, 5 mL, Oral, Q4H PRN, [name], MD, 5 mL at 10/22/21 6606 ?  hydrALAZINE (APRESOLINE) injection 5 mg, 5 mg, Intravenous, Q6H PRN, Danis, Estill Cotta III, MD ?  hydrALAZINE (APRESOLINE) tablet 50 mg, 50 mg, Oral, TID, [name], MD, 50 mg at 10/24/21 0858 ?  insulin aspart (novoLOG) injection 0-5 Units, 0-5 Units, Subcutaneous, QHS, [name], MD, 3 Units at 10/23/21 2128 ?  insulin aspart (novoLOG) injection 0-9 Units, 0-9 Units, Subcutaneous, TID WC, [name], MD, 2 Units at 10/24/21 1221 ?  isosorbide  mononitrate (IMDUR) 24 hr tablet 15 mg, 15 mg, Oral, Daily, Danis, Brihanna Devenport L III, MD, 15 mg at 10/24/21 0859 ?  nitroGLYCERIN (NITROSTAT) SL tablet 0.4 mg, 0.4 mg, Sublingual, Q5 min PRN, Danis, Estill Cotta III, MD ?  ondansetron (ZOFRAN) injection 4 mg, 4 mg, Intravenous, Q6H PRN, [name], MD, 4 mg at 10/22/21 0030 ?  pantoprazole (PROTONIX) EC tablet 40 mg, 40 mg, Oral, BID, Domenic Polite, MD ?  sertraline (ZOLOFT) tablet 150 mg, 150 mg, Oral, Daily, [name], MD, 150 mg at 10/24/21 0858 ?  sucralfate (CARAFATE) 1 GM/10ML suspension 1 g, 1 g, Oral, TID AC & HS, [name], MD, 1 g at 10/24/21 0859 ?  traMADol (ULTRAM) tablet 25 mg, 25 mg, Oral, Q6H PRN, [name], MD, 25 mg at 10/21/21 1009 ? ?  ? ?Vital signs in last 24 hrs: ?Vitals:  ? 10/24/21 0334 10/24/21 1202  ?BP: (!) 144/69 138/77  ?Pulse: 72 67  ?Resp: 14 14  ?Temp: 97.6 ?F (36.4 ?C)   ?SpO2: 95% 94%  ? ? ?Intake/Output Summary (Last 24 hours) at 10/24/2021 1300 ?Last data filed at 10/24/2021 1003 ?Gross per 24 hour  ?Intake 558 ml  ?Output 1300 ml  ?Net -742 ml  ? ? ? ?Physical Exam ?Chronically ill-appearing man.  His affect and color are improved, he is sitting up in bed, makes eye contact and looks best I have seen him this  admission. ?HEENT: sclera anicteric, oral mucosa without lesions ?Neck: supple, no thyromegaly, JVD or lymphadenopathy ?Cardiac: RRR without murmurs, S1S2 heard, no peripheral edema ?Pulm: clear to auscultation bilaterally, normal RR and effort noted ?Abdomen: soft, no tenderness, with active bowel sounds. No guarding or palpable hepatosplenomegaly ?Skin; pale ? ?Recent Labs: ? ? ?  Latest Ref Rng & Units 10/24/2021  ?  3:55 AM 10/23/2021  ?  4:34 AM 10/22/2021  ? 11:01 PM  ?CBC  ?WBC 4.0 - 10.5 K/uL 6.4   6.4     ?Hemoglobin 13.0 - 17.0 g/dL 7.7   7.4   7.7    ?Hematocrit 39.0 - 52.0 % 24.3   23.0   23.1    ?Platelets 150 - 400 K/uL 215   187     ? ? ?No results for input(s): INR in the last 168  hours. ? ?  Latest Ref Rng & Units 10/24/2021  ?  3:55 AM 10/23/2021  ?  4:34 AM 10/22/2021  ?  4:10 AM  ?CMP  ?Glucose 70 - 99 mg/dL 191   129   251    ?BUN 8 - 23 mg/dL 92   102   109    ?Creatinine 0.61 - 1.24 mg/dL 4.16   3.96   3.79    ?Sodium 135 - 145 mmol/L 136   137   134    ?Potassium 3.5 - 5.1 mmol/L 4.0   4.0   4.4    ?Chloride 98 - 111 mmol/L 105   106   104    ?CO2 22 - 32 mmol/L '21   20   19    '$ ?Calcium 8.9 - 10.3 mg/dL 8.3   8.4   7.9    ? ? ? ?Radiologic studies: ? ? ?Assessment & Plan  ?Assessment: ?Melena ?Acute on chronic blood loss anemia ?Esophageal stricture with severe circumferential ulceration as sequelae of RFA endoscopic ablation of Barrett's esophagus at [facility] on 09/27/2021 (Dr. Renford Dills). ? ?Additional medical issues as noted before, this admission complicated by recurrent CHF with pulmonary edema ? ? ?His bleeding has stopped with conservative therapy.  No active bleeding on EGD, no discrete lesions amenable to endoscopic intervention. ?Plan: ?Continue twice daily oral PPI until further notice ?Carafate 3 times daily and at bedtime as ordered ?I will start him on a soft consistency diet ? ?He needs to see the  GI group within 2 weeks after discharge and also contact Dr. Renford Dills for recommendations on follow-up with their clinic.  This is particularly important since [name] appears to have another endoscopic procedure planned at [facility] next month. ? ?Remain off oral anticoagulation until he is seen in GI follow-up for further decision on reevaluation of the esophagitis. ? ?We are signing off for inpatient work, call us if urgent issues arise, particular recurrent GI bleeding. ? ?[name] ?Office: [phone] ? ?

## 2021-10-24 NOTE — TOC Progression Note (Signed)
Transition of Care (TOC) - Progression Note  ? ? ?Patient Details  ?Name: [name] ?MRN: [id number] ?Date of Birth: 02-13-1956 ? ?Transition of Care (TOC) CM/SW Contact  ?Bary Castilla, LCSW ?Phone Number: [phone] ?10/24/2021, 3:24 PM ? ?Clinical Narrative:    ? ?CSW spoke with [name] again in regards to bed offers. [name] stated that she spoke with pt and he really wants [place] also discussed that more bed offers may come back on Monday and a follow on [facility]. ? ?TOC team will continue to assist with discharge planning needs.  ? ?Expected Discharge Plan: [place] ?Barriers to Discharge: Continued Medical Work up, SNF Pending bed offer, Insurance Authorization ? ?Expected Discharge Plan and Services ?Expected Discharge Plan: [place] ?  ?  ?Post Acute Care Choice: [place] ?Living arrangements for the past 2 months: [place] ?                ?  ?  ?  ?  ?  ?  ?  ?  ?  ?  ? ? ?Social Determinants of Health ([name]) Interventions ?  ? ?Readmission Risk Interventions ? ?  08/30/2021  ?  2:40 PM 07/26/2021  ?  8:24 AM 03/29/2019  ? 10:03 AM  ?Readmission Risk Prevention Plan  ?Transportation Screening Complete Complete Complete  ?PCP or Specialist Appt within 5-7 Days   Complete  ?Home Care Screening   Complete  ?Medication Review (RN CM)   Complete  ?[place] or Home Care Consult  Complete   ?Social Work Consult for [place] Planning/Counseling  Complete   ?Palliative Care Screening  Not Applicable   ?Medication Review Press photographer) Complete Complete   ?PCP or Specialist appointment within 3-5 days of discharge Not Complete    ?[place] or Home Care Consult Complete    ?SW Recovery Care/Counseling Consult Complete    ?Palliative Care Screening Not Applicable    ?[place] Not Applicable    ? ? ?

## 2021-10-25 ENCOUNTER — Encounter (HOSPITAL_COMMUNITY): Payer: Self-pay | Admitting: Gastroenterology

## 2021-10-25 DIAGNOSIS — K921 Melena: Secondary | ICD-10-CM | POA: Diagnosis not present

## 2021-10-25 DIAGNOSIS — D62 Acute posthemorrhagic anemia: Secondary | ICD-10-CM | POA: Diagnosis not present

## 2021-10-25 LAB — POCT I-STAT, CHEM 8
BUN: 123 mg/dL — ABNORMAL HIGH (ref 8–23)
Calcium, Ion: 1.16 mmol/L (ref 1.15–1.40)
Chloride: 101 mmol/L (ref 98–111)
Creatinine, Ser: 4.2 mg/dL — ABNORMAL HIGH (ref 0.61–1.24)
Glucose, Bld: 153 mg/dL — ABNORMAL HIGH (ref 70–99)
HCT: 24 % — ABNORMAL LOW (ref 39.0–52.0)
Hemoglobin: 8.2 g/dL — ABNORMAL LOW (ref 13.0–17.0)
Potassium: 4.2 mmol/L (ref 3.5–5.1)
Sodium: 135 mmol/L (ref 135–145)
TCO2: 21 mmol/L — ABNORMAL LOW (ref 22–32)

## 2021-10-25 LAB — CBC
HCT: 24 % — ABNORMAL LOW (ref 39.0–52.0)
Hemoglobin: 7.5 g/dL — ABNORMAL LOW (ref 13.0–17.0)
MCH: 27.2 pg (ref 26.0–34.0)
MCHC: 31.3 g/dL (ref 30.0–36.0)
MCV: 87 fL (ref 80.0–100.0)
Platelets: 211 10*3/uL (ref 150–400)
RBC: 2.76 MIL/uL — ABNORMAL LOW (ref 4.22–5.81)
RDW: 17.7 % — ABNORMAL HIGH (ref 11.5–15.5)
WBC: 5.5 10*3/uL (ref 4.0–10.5)
nRBC: 0 % (ref 0.0–0.2)

## 2021-10-25 LAB — BASIC METABOLIC PANEL
Anion gap: 8 (ref 5–15)
BUN: 72 mg/dL — ABNORMAL HIGH (ref 8–23)
CO2: 21 mmol/L — ABNORMAL LOW (ref 22–32)
Calcium: 8.4 mg/dL — ABNORMAL LOW (ref 8.9–10.3)
Chloride: 107 mmol/L (ref 98–111)
Creatinine, Ser: 3.72 mg/dL — ABNORMAL HIGH (ref 0.61–1.24)
GFR, Estimated: 17 mL/min — ABNORMAL LOW (ref >60)
Glucose, Bld: 117 mg/dL — ABNORMAL HIGH (ref 70–99)
Potassium: 3.8 mmol/L (ref 3.5–5.1)
Sodium: 136 mmol/L (ref 135–145)

## 2021-10-25 LAB — GLUCOSE, CAPILLARY
Glucose-Capillary: 127 mg/dL — ABNORMAL HIGH (ref 70–99)
Glucose-Capillary: 224 mg/dL — ABNORMAL HIGH (ref 70–99)
Glucose-Capillary: 191 mg/dL — ABNORMAL HIGH (ref 70–99)
Glucose-Capillary: 198 mg/dL — ABNORMAL HIGH (ref 70–99)

## 2021-10-25 LAB — PREPARE RBC (CROSSMATCH)

## 2021-10-25 LAB — HEMOGLOBIN AND HEMATOCRIT, BLOOD
HCT: 26 % — ABNORMAL LOW (ref 39.0–52.0)
Hemoglobin: 8.3 g/dL — ABNORMAL LOW (ref 13.0–17.0)

## 2021-10-25 MED ORDER — TORSEMIDE 20 MG PO TABS
40.0000 mg | ORAL_TABLET | Freq: Every day | ORAL | Status: DC
Start: 2021-10-25 — End: 2021-10-28
  Administered 2021-10-25 – 2021-10-28 (×4): 40 mg via ORAL
  Filled 2021-10-25 (×4): qty 2

## 2021-10-25 MED ORDER — SODIUM CHLORIDE 0.9% IV SOLUTION: Freq: Once | INTRAVENOUS | Status: AC

## 2021-10-25 NOTE — Care Management Important Message (Signed)
Important Message ? ?Patient Details  ?Name: [name] ?MRN: [id number] ?Date of Birth: [birth year] ? ? ?Medicare Important Message Given:  Yes ? ? ? ? ?Shelda Altes ?10/25/2021, 8:49 AM ?

## 2021-10-25 NOTE — Progress Notes (Signed)
Physical Therapy Treatment ?Patient Details ?Name: [name] ?MRN: [id number] ?DOB: 1955-12-08 ?Today's Date: 10/25/2021 ? ? ?History of Present Illness Pt is a 66 y/o male admitted secondary to increased weakness and falls. Found to have CHF exacerbation and anemia. PMH includes HTN, DM, CKD, CHF, CVA, and esophageal cancer. ? ?  ?PT Comments  ? ? The pt was agreeable to session this afternoon despite reports of continued fatigue. The pt was able to complete short bout of ambulation in the room with minA and use of RW to maintain stability with gait. Due to instability he ambulates with slowed pace, narrow BOS, and minimal stride length and clearance. Mobility progression limited by pt reports of fatigue after 2 x 15 ft ambulation in the room, will continue to benefit from skilled PT and maximal mobility to progress functional strength, endurance, and stability with OOB transfers. Continue to recommend SNF rehab at d/c to help pt achieve goals of returning home with greater independence.  ?   ?Recommendations for follow up therapy are one component of a multi-disciplinary discharge planning process, led by the attending physician.  Recommendations may be updated based on patient status, additional functional criteria and insurance authorization. ? ?Follow Up Recommendations ? Skilled nursing-short term rehab (<3 hours/day) ?  ?  ?Assistance Recommended at Discharge Frequent or constant Supervision/Assistance  ?Patient can return home with the following A little help with walking and/or transfers;A little help with bathing/dressing/bathroom;Assistance with cooking/housework;Help with stairs or ramp for entrance;Assist for transportation;Direct supervision/assist for financial management;Direct supervision/assist for medications management ?  ?Equipment Recommendations ? None recommended by PT  ?  ?Recommendations for Other Services   ? ? ?  ?Precautions / Restrictions Precautions ?Precautions: Fall ?Precaution  Comments: multiple falls at home ?Restrictions ?Weight Bearing Restrictions: No  ?  ? ?Mobility ? Bed Mobility ?Overal bed mobility: Needs Assistance ?Bed Mobility: Supine to Sit, Sit to Supine ?  ?  ?Supine to sit: HOB elevated, Mod assist ?Sit to supine: Min guard ?  ?General bed mobility comments: minG with increased time and some assist for line management. pt using bed rails well initially, then modA to complete trunk elevation. The pt was then able to return to supine with minG for line management ?  ? ?Transfers ?Overall transfer level: Needs assistance ?Equipment used: Rolling walker (2 wheels) ?Transfers: Sit to/from Stand ?Sit to Stand: Min guard ?  ?  ?  ?  ?  ?General transfer comment: minG to power up to standing, cues for hand placement, minG for safety once in standing but pt able to maintain without LOB with RW ?  ? ?Ambulation/Gait ?Ambulation/Gait assistance: Min assist ?Gait Distance (Feet): 15 Feet (+ 61f) ?Assistive device: Rolling walker (2 wheels) ?Gait Pattern/deviations: Step-through pattern, Decreased stride length, Narrow base of support, Shuffle ?Gait velocity: decreased ?  ?  ?General Gait Details: slow with narrow BOS and small steps, mild instability but no overt LOB. ? ? ? ?  ?Balance Overall balance assessment: Needs assistance ?Sitting-balance support: No upper extremity supported, Feet supported ?Sitting balance-Leahy Scale: Fair ?  ?  ?Standing balance support: Bilateral upper extremity supported ?Standing balance-Leahy Scale: Poor ?Standing balance comment: Reliant on BUE support ?  ?  ?  ?  ?  ?  ?  ?  ?  ?  ?  ?  ? ?  ?Cognition Arousal/Alertness: Awake/alert ?Behavior During Therapy: Flat affect ?Overall Cognitive Status: Impaired/Different from baseline ?Area of Impairment: Attention, Memory, Following commands, Safety/judgement, Awareness, Problem solving ?  ?  ?  ?  ?  ?  ?  ?  ?  ?  Current Attention Level: Focused ?Memory: Decreased recall of precautions ?Following  Commands: Follows one step commands with increased time, Follows multi-step commands with increased time, Follows multi-step commands inconsistently ?Safety/Judgement: Decreased awareness of safety, Decreased awareness of deficits ?Awareness: Emergent ?Problem Solving: Slow processing, Decreased initiation, Difficulty sequencing, Requires verbal cues, Requires tactile cues ?General Comments: pt alert but with flat affect. poor insight to severity of deficits, but able to direct care and assist needed. Pt following instructions well through session ?  ?  ? ?  ?Exercises   ? ?  ?General Comments General comments (skin integrity, edema, etc.): BP stable, pt receiving blood during session. ?  ?  ? ?Pertinent Vitals/Pain Pain Assessment ?Pain Assessment: No/denies pain  ? ? ? ?PT Goals (current goals can now be found in the care plan section) Acute Rehab PT Goals ?Patient Stated Goal: to go home ?PT Goal Formulation: With patient ?Time For Goal Achievement: 11/04/21 ?Potential to Achieve Goals: Good ?Progress towards PT goals: Progressing toward goals ? ?  ?Frequency ? ? ? Min 2X/week ? ? ? ?  ?PT Plan Current plan remains appropriate  ? ? ?   ?AM-PAC PT "6 Clicks" Mobility   ?Outcome Measure ? Help needed turning from your back to your side while in a flat bed without using bedrails?: A Little ?Help needed moving from lying on your back to sitting on the side of a flat bed without using bedrails?: A Lot ?Help needed moving to and from a bed to a chair (including a wheelchair)?: A Little ?Help needed standing up from a chair using your arms (e.g., wheelchair or bedside chair)?: A Little ?Help needed to walk in hospital room?: A Lot ?Help needed climbing 3-5 steps with a railing? : Total ?6 Click Score: 14 ? ?  ?End of Session Equipment Utilized During Treatment: Gait belt ?Activity Tolerance: Patient limited by fatigue ?Patient left: in bed;with call bell/phone within reach;with bed alarm set;with family/visitor  present ?Nurse Communication: Mobility status ?PT Visit Diagnosis: Unsteadiness on feet (R26.81);Muscle weakness (generalized) (M62.81);History of falling (Z91.81);Repeated falls (R29.6) ?  ? ? ?Time: 0272-5366 ?PT Time Calculation (min) (ACUTE ONLY): 29 min ? ?Charges:  $Gait Training: 8-22 mins ?$Therapeutic Activity: 8-22 mins          ?          ? ?West Carbo, PT, DPT  ? ?Acute Rehabilitation Department ?Pager #: [phone] - [phone] ? ? ?Sandra Cockayne ?10/25/2021, 3:40 PM ? ?

## 2021-10-25 NOTE — TOC Progression Note (Signed)
Transition of Care (TOC) - Progression Note  ? ? ?Patient Details  ?Name: [name] ?MRN: [id number] ?Date of Birth: June 18, 1955 ? ?Transition of Care (TOC) CM/SW Contact  ?Zenon Mayo, RN ?Phone Number: ?10/25/2021, 11:51 AM ? ?Clinical Narrative:    ?Per progression note, hgb is 7.5, holding diuretic, changing to torsemide today.  Possible dc tomorrow.  ? ? ?Expected Discharge Plan: [place] ?Barriers to Discharge: Continued Medical Work up, SNF Pending bed offer, Insurance Authorization ? ?Expected Discharge Plan and Services ?Expected Discharge Plan: [place] ?  ?  ?Post Acute Care Choice: [place] ?Living arrangements for the past 2 months: [place] ?                ?  ?  ?  ?  ?  ?  ?  ?  ?  ?  ? ? ?Social Determinants of Health ([name]) Interventions ?  ? ?Readmission Risk Interventions ? ?  08/30/2021  ?  2:40 PM 07/26/2021  ?  8:24 AM 03/29/2019  ? 10:03 AM  ?Readmission Risk Prevention Plan  ?Transportation Screening Complete Complete Complete  ?PCP or Specialist Appt within 5-7 Days   Complete  ?Home Care Screening   Complete  ?Medication Review (RN CM)   Complete  ?[place] or Home Care Consult  Complete   ?Social Work Consult for [place] Planning/Counseling  Complete   ?Palliative Care Screening  Not Applicable   ?Medication Review Press photographer) Complete Complete   ?PCP or Specialist appointment within 3-5 days of discharge Not Complete    ?[place] or Home Care Consult Complete    ?SW Recovery Care/Counseling Consult Complete    ?Palliative Care Screening Not Applicable    ?[place] Not Applicable    ? ? ?

## 2021-10-25 NOTE — Progress Notes (Signed)
Pt refused CPAP for tonight.  

## 2021-10-25 NOTE — TOC Progression Note (Addendum)
Transition of Care (TOC) - Progression Note  ? ? ?Patient Details  ?Name: ALASTAIR HENNES ?MRN: [id number] ?Date of Birth: 19-Oct-1955 ? ?Transition of Care (TOC) CM/SW Contact  ?Parker, LCSW ?Phone Number: ?10/25/2021, 11:45 AM ? ?Clinical Narrative:    ? ?CSW contacted Melanie with [facility]; left message requesting return call.  ? ?Threasa Beards called CSW back and notified that a nurse will review referral and will notify CSW if they can accept pt.  ? ?1320: CSW received call back from [place] that [facility] can accept pt. They will start insurance auth today. CSW spoke with pt who confirmed he wants [facility].  ? ?Expected Discharge Plan: [place] ?Barriers to Discharge: Continued Medical Work up, SNF Pending bed offer, Insurance Authorization ? ?Expected Discharge Plan and Services ?Expected Discharge Plan: [place] ?  ?  ?Post Acute Care Choice: [place] ?Living arrangements for the past 2 months: [place] ?                ?  ?  ?  ?  ?  ?  ?  ?  ?  ?  ? ? ?Social Determinants of Health ([name]) Interventions ?  ? ?Readmission Risk Interventions ? ?  08/30/2021  ?  2:40 PM 07/26/2021  ?  8:24 AM 03/29/2019  ? 10:03 AM  ?Readmission Risk Prevention Plan  ?Transportation Screening Complete Complete Complete  ?PCP or Specialist Appt within 5-7 Days   Complete  ?Home Care Screening   Complete  ?Medication Review (RN CM)   Complete  ?[place] or Home Care Consult  Complete   ?Social Work Consult for [place] Planning/Counseling  Complete   ?Palliative Care Screening  Not Applicable   ?Medication Review Press photographer) Complete Complete   ?PCP or Specialist appointment within 3-5 days of discharge Not Complete    ?[place] or Home Care Consult Complete    ?SW Recovery Care/Counseling Consult Complete    ?Palliative Care Screening Not Applicable    ?[place] Not Applicable    ? ? ?
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
## 2021-10-26 DIAGNOSIS — I5043 Acute on chronic combined systolic (congestive) and diastolic (congestive) heart failure: Secondary | ICD-10-CM | POA: Diagnosis not present

## 2021-10-26 LAB — TYPE AND SCREEN
ABO/RH(D): B POS
Antibody Screen: NEGATIVE
Unit division: 0

## 2021-10-26 LAB — BPAM RBC
Blood Product Expiration Date: 202305232359
ISSUE DATE / TIME: 202305151344
Unit Type and Rh: 7300

## 2021-10-26 LAB — CBC
HCT: 27.2 % — ABNORMAL LOW (ref 39.0–52.0)
Hemoglobin: 8.8 g/dL — ABNORMAL LOW (ref 13.0–17.0)
MCH: 27.9 pg (ref 26.0–34.0)
MCHC: 32.4 g/dL (ref 30.0–36.0)
MCV: 86.3 fL (ref 80.0–100.0)
Platelets: 205 10*3/uL (ref 150–400)
RBC: 3.15 MIL/uL — ABNORMAL LOW (ref 4.22–5.81)
RDW: 18.4 % — ABNORMAL HIGH (ref 11.5–15.5)
WBC: 6.6 10*3/uL (ref 4.0–10.5)
nRBC: 0 % (ref 0.0–0.2)

## 2021-10-26 LAB — BASIC METABOLIC PANEL
Anion gap: 5 (ref 5–15)
BUN: 66 mg/dL — ABNORMAL HIGH (ref 8–23)
CO2: 20 mmol/L — ABNORMAL LOW (ref 22–32)
Calcium: 8.1 mg/dL — ABNORMAL LOW (ref 8.9–10.3)
Chloride: 110 mmol/L (ref 98–111)
Creatinine, Ser: 3.73 mg/dL — ABNORMAL HIGH (ref 0.61–1.24)
GFR, Estimated: 17 mL/min — ABNORMAL LOW (ref >60)
Glucose, Bld: 219 mg/dL — ABNORMAL HIGH (ref 70–99)
Potassium: 3.9 mmol/L (ref 3.5–5.1)
Sodium: 135 mmol/L (ref 135–145)

## 2021-10-26 LAB — GLUCOSE, CAPILLARY
Glucose-Capillary: 186 mg/dL — ABNORMAL HIGH (ref 70–99)
Glucose-Capillary: 251 mg/dL — ABNORMAL HIGH (ref 70–99)
Glucose-Capillary: 139 mg/dL — ABNORMAL HIGH (ref 70–99)
Glucose-Capillary: 180 mg/dL — ABNORMAL HIGH (ref 70–99)

## 2021-10-26 MED ORDER — SUCRALFATE 1 GM/10ML PO SUSP: 1.0000 g | Freq: Three times a day (TID) | ORAL | 0 refills | Status: DC

## 2021-10-26 MED ORDER — HYDRALAZINE HCL 100 MG PO TABS: 50.0000 mg | ORAL_TABLET | Freq: Three times a day (TID) | ORAL | Status: DC

## 2021-10-26 MED ORDER — ISOSORBIDE MONONITRATE ER 30 MG PO TB24: 15.0000 mg | ORAL_TABLET | Freq: Every day | ORAL | Status: DC

## 2021-10-26 NOTE — Progress Notes (Signed)
Occupational Therapy Treatment ?Patient Details ?Name: [name] ?MRN: [id number] ?DOB: [birth year] ?Today's Date: 10/26/2021 ? ? ?History of present illness Pt is a 66 y/o male admitted secondary to increased weakness and falls. Found to have CHF exacerbation and anemia. PMH includes HTN, DM, CKD, CHF, CVA, and esophageal cancer. ?  ?OT comments ? Pt seen today with focus on functional mobility and ADL retraining. Pt is Min A for safety during mobility and transfers (chair, toilet and back to bed) and was noted to have LOB x2 likely secondary to narrow base of stance. Pt is reliant on bilateral UE support from RW and expressed fear of falling secondary to h/o falls at home per his report. He was educated in role of OT and is agreeable w/ plan for short term rehab at [facility] when medically able.  ? ?Recommendations for follow up therapy are one component of a multi-disciplinary discharge planning process, led by the attending physician.  Recommendations may be updated based on patient status, additional functional criteria and insurance authorization. ?   ?Follow Up Recommendations ? Skilled nursing-short term rehab (<3 hours/day)  ?  ?Assistance Recommended at Discharge    ?Patient can return home with the following ? A lot of help with bathing/dressing/bathroom;Assistance with cooking/housework;Direct supervision/assist for medications management;Direct supervision/assist for financial management;Assist for transportation;Help with stairs or ramp for entrance;A lot of help with walking and/or transfers ?  ?Equipment Recommendations ? Other (comment) (Defer to next venue)  ?  ?Recommendations for Other Services   ? ?  ?Precautions / Restrictions Precautions ?Precautions: Fall ?Precaution Comments: multiple falls at home ?Restrictions ?Weight Bearing Restrictions: No  ? ? ?  ? ?Mobility Bed Mobility ?Overal bed mobility:  (Pt received up in chair) ?  ? ?Transfers ?Overall transfer level: Needs assistance ?Equipment  used: Rolling walker (2 wheels) ?Transfers: Sit to/from Stand ?Sit to Stand: Min assist ?  ?General transfer comment: Min A to power up to standing, cues for hand placement and Min guard/Min A for safety once ambulating secondary to narrow base of stance. Relaint on UE support on RW. ?  ?  ?Balance Overall balance assessment: Needs assistance ?Sitting-balance support: No upper extremity supported, Feet supported ?Sitting balance-Leahy Scale: Fair ?  ?  ?Standing balance support: Bilateral upper extremity supported, Reliant on assistive device for balance ?Standing balance-Leahy Scale: Poor ?Standing balance comment: Reliant on BUE support ?   ? ?ADL either performed or assessed with clinical judgement  ? ?ADL Overall ADL's : Needs assistance/impaired ?  ?  ?Grooming: Wash/dry hands;Standing;Sitting;Wash/dry face;Minimal assistance ?Grooming Details (indicate cue type and reason): Pt stood at sink to wash hands after toileting with Min A. Pt with narrow BOS and was reliant on sink for support. Pt sat at EOB to wash face. ?  ?  ?Toilet Transfer: Minimal assistance;Rolling walker (2 wheels);Regular Toilet;Grab bars ?Toilet Transfer Details (indicate cue type and reason): Pt is Min A ambulating from chair into bathroom and on/off toilet using grab bar. Pt required hands on assist x2 secondary to LOB and nearly missed commode seat/required hands on assist. ?Toileting- Clothing Manipulation and Hygiene: Minimal assistance;Sitting/lateral lean;Sit to/from stand ?  ?Functional mobility during ADLs: Minimal assistance;Cueing for safety;Cueing for sequencing;Rolling walker (2 wheels) ?General ADL Comments: Pt requires consistent min A for functional mobility in room and bathroom during toileting and ADL/transfers. Pt amb with narrow base of stance and was noted to have LOB x2 during this session. He requires hands on assist for corrections to avoid a possible  fall. Pt was educated on role of OT and PLOC/goals. He is in  agreement with d/c to SNF rehab to assist in maximizing independence with transfers. Pt expresses fear of falling and concrens about upcoming eye appointment as he states impaired vision in left eye impacts his balance. Pt left sitting up at EOB speaking with MD at conclusion of OT session. ?  ? ?Extremity/Trunk Assessment Upper Extremity Assessment ?Upper Extremity Assessment: Generalized weakness ?  ?Lower Extremity Assessment ?Lower Extremity Assessment: Defer to PT evaluation ?  ?Cervical / Trunk Assessment ?Cervical / Trunk Assessment: Kyphotic ?  ? ?Vision Baseline Vision/History: 1 Wears glasses ?Ability to See in Adequate Light: 2 Moderately impaired ?Patient Visual Report: Blurring of vision (Left eye) ?  ?  ?   ?   ? ?Cognition Arousal/Alertness: Awake/alert ?Behavior During Therapy: Flat affect ?Overall Cognitive Status: Impaired/Different from baseline ?Area of Impairment: Attention, Memory, Following commands, Safety/judgement, Awareness, Problem solving ?  ?Current Attention Level: Focused ?Memory: Decreased recall of precautions ?Following Commands: Follows one step commands with increased time, Follows multi-step commands with increased time, Follows multi-step commands inconsistently ?Safety/Judgement: Decreased awareness of safety, Decreased awareness of deficits ?Awareness: Emergent ?Problem Solving: Slow processing, Decreased initiation, Difficulty sequencing, Requires verbal cues, Requires tactile cues ?  ?  ?  ?   ?   ?   ?General Comments  Pt left sitting up at EOB speaking with MD  ? ? ?Pertinent Vitals/ Pain       Pain Assessment ?Pain Assessment: No/denies pain ? ?Home Living Family/patient expects to be discharged to:: Private residence ?Living Arrangements: Alone ?Available Help at Discharge: Personal care attendant [facility]- comes 5 days/ week for about 1 hour) ?Type of Home: House ?Home Access: Stairs to enter ?Entrance Stairs-Number of Steps: 6 ?Entrance Stairs-Rails: Right ?Home  Layout: Multi-level ?Alternate Level Stairs-Number of Steps: 6 + 6 ?Alternate Level Stairs-Rails: Left ?Bathroom Shower/Tub: Walk-in shower ?  ?Bathroom Toilet: Standard ?  ?  ?Home Equipment: Grab bars - tub/shower;Rolling Walker (2 wheels);Cane - quad ?  ?  ?  ? ?  ?Prior Functioning/Environment Prior Level of Function : Needs assist ?Mobility Comments: Uses RW ?ADLs Comments: [name] helps with IADL tasks. Reports independence with ADL tasks ?   ? ?Frequency ? Min 2X/week  ? ? ? ? ?  ?Progress Toward Goals ? ?OT Goals(current goals can now be found in the care plan section) ? Progress towards OT goals: Progressing toward goals ? ?Acute Rehab OT Goals ?Patient Stated Goal: Re-schedule upcoming out-pt eye appointment and SNF Rehab ?OT Goal Formulation: Patient unable to participate in goal setting ?Time For Goal Achievement: 11/05/21 ?Potential to Achieve Goals: Fair  ?Plan Discharge plan remains appropriate   ? ?   ?AM-PAC OT "6 Clicks" Daily Activity     ?Outcome Measure ? ? Help from another person eating meals?: A Little ?Help from another person taking care of personal grooming?: A Little ?Help from another person toileting, which includes using toliet, bedpan, or urinal?: A Lot ?Help from another person bathing (including washing, rinsing, drying)?: A Lot ?Help from another person to put on and taking off regular upper body clothing?: A Little ?Help from another person to put on and taking off regular lower body clothing?: A Lot ?6 Click Score: 15 ? ?  ?End of Session Equipment Utilized During Treatment: Rolling walker (2 wheels) ? ?OT Visit Diagnosis: Unsteadiness on feet (R26.81);Other abnormalities of gait and mobility (R26.89);Repeated falls (R29.6);Muscle weakness (generalized) (M62.81);History of falling (Z91.81);Other symptoms  and signs involving cognitive function ?  ?Activity Tolerance Patient tolerated treatment well ?  ?Patient Left in bed;with call bell/phone within reach;Other (comment) (With MD  in room, pt discussing concerns re: fear of falls and L eye appointment) ?  ?Nurse Communication   ?  ? ?   ? ?Time: 8832-5498 ?OT Time Calculation (min): 25 min ? ?Charges: OT General Charges ?$OT Visit: 1 Visit ?

## 2021-10-26 NOTE — Progress Notes (Signed)
Pt refuses CPAP 
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
## 2021-10-27 DIAGNOSIS — E1122 Type 2 diabetes mellitus with diabetic chronic kidney disease: Secondary | ICD-10-CM | POA: Diagnosis not present

## 2021-10-27 DIAGNOSIS — N184 Chronic kidney disease, stage 4 (severe): Secondary | ICD-10-CM

## 2021-10-27 DIAGNOSIS — D631 Anemia in chronic kidney disease: Secondary | ICD-10-CM

## 2021-10-27 DIAGNOSIS — I130 Hypertensive heart and chronic kidney disease with heart failure and stage 1 through stage 4 chronic kidney disease, or unspecified chronic kidney disease: Secondary | ICD-10-CM | POA: Diagnosis not present

## 2021-10-27 DIAGNOSIS — I2510 Atherosclerotic heart disease of native coronary artery without angina pectoris: Secondary | ICD-10-CM

## 2021-10-27 DIAGNOSIS — I5033 Acute on chronic diastolic (congestive) heart failure: Secondary | ICD-10-CM | POA: Diagnosis not present

## 2021-10-27 DIAGNOSIS — I5022 Chronic systolic (congestive) heart failure: Secondary | ICD-10-CM | POA: Diagnosis not present

## 2021-10-27 LAB — GLUCOSE, CAPILLARY
Glucose-Capillary: 138 mg/dL — ABNORMAL HIGH (ref 70–99)
Glucose-Capillary: 206 mg/dL — ABNORMAL HIGH (ref 70–99)
Glucose-Capillary: 256 mg/dL — ABNORMAL HIGH (ref 70–99)
Glucose-Capillary: 173 mg/dL — ABNORMAL HIGH (ref 70–99)

## 2021-10-27 LAB — BASIC METABOLIC PANEL
Anion gap: 8 (ref 5–15)
BUN: 57 mg/dL — ABNORMAL HIGH (ref 8–23)
CO2: 21 mmol/L — ABNORMAL LOW (ref 22–32)
Calcium: 8.3 mg/dL — ABNORMAL LOW (ref 8.9–10.3)
Chloride: 107 mmol/L (ref 98–111)
Creatinine, Ser: 3.51 mg/dL — ABNORMAL HIGH (ref 0.61–1.24)
GFR, Estimated: 19 mL/min — ABNORMAL LOW (ref >60)
Glucose, Bld: 147 mg/dL — ABNORMAL HIGH (ref 70–99)
Potassium: 3.7 mmol/L (ref 3.5–5.1)
Sodium: 136 mmol/L (ref 135–145)

## 2021-10-27 LAB — CBC
HCT: 25.8 % — ABNORMAL LOW (ref 39.0–52.0)
Hemoglobin: 8.3 g/dL — ABNORMAL LOW (ref 13.0–17.0)
MCH: 27.5 pg (ref 26.0–34.0)
MCHC: 32.2 g/dL (ref 30.0–36.0)
MCV: 85.4 fL (ref 80.0–100.0)
Platelets: 208 10*3/uL (ref 150–400)
RBC: 3.02 MIL/uL — ABNORMAL LOW (ref 4.22–5.81)
RDW: 17.7 % — ABNORMAL HIGH (ref 11.5–15.5)
WBC: 6.5 10*3/uL (ref 4.0–10.5)
nRBC: 0 % (ref 0.0–0.2)

## 2021-10-27 MED ORDER — SODIUM CHLORIDE 0.9 % IV SOLN
510.0000 mg | Freq: Once | INTRAVENOUS | Status: AC
  Administered 2021-10-27: 510 mg via INTRAVENOUS
  Filled 2021-10-27: qty 17

## 2021-10-27 NOTE — Progress Notes (Signed)
Pt continues to refuse CPAP °

## 2021-10-27 NOTE — TOC Progression Note (Signed)
Transition of Care (TOC) - Progression Note  ? ? ?Patient Details  ?Name: [name] ?MRN: [id number] ?Date of Birth: 09-21-1955 ? ?Transition of Care (TOC) CM/SW Contact  ?Maritta Kief Renold Don, LCSWA ?Phone Number: ?10/27/2021, 12:02 PM ? ?Clinical Narrative:    ?CSW contacted [facility] to inquire about pt auth status. Josem Kaufmann is still pending, admission sent over additional clinicals yesterday evening.  ? ? ?Expected Discharge Plan: [place] ?Barriers to Discharge: Ship broker, Continued Medical Work up ? ?Expected Discharge Plan and Services ?Expected Discharge Plan: [place] ?  ?  ?Post Acute Care Choice: [place] ?Living arrangements for the past 2 months: [place] ?                ?  ?  ?  ?  ?  ?  ?  ?  ?  ?  ? ? ?Social Determinants of Health ([name]) Interventions ?  ? ?Readmission Risk Interventions ? ?  08/30/2021  ?  2:40 PM 07/26/2021  ?  8:24 AM 03/29/2019  ? 10:03 AM  ?Readmission Risk Prevention Plan  ?Transportation Screening Complete Complete Complete  ?PCP or Specialist Appt within 5-7 Days   Complete  ?Home Care Screening   Complete  ?Medication Review (RN CM)   Complete  ?[place] or Home Care Consult  Complete   ?Social Work Consult for [place] Planning/Counseling  Complete   ?Palliative Care Screening  Not Applicable   ?Medication Review Press photographer) Complete Complete   ?PCP or Specialist appointment within 3-5 days of discharge Not Complete    ?[place] or Home Care Consult Complete    ?SW Recovery Care/Counseling Consult Complete    ?Palliative Care Screening Not Applicable    ?[place] Not Applicable    ? ? ?

## 2021-10-27 NOTE — Plan of Care (Signed)
  Problem: Activity: Goal: Risk for activity intolerance will decrease Outcome: Progressing   Problem: Safety: Goal: Ability to remain free from injury will improve Outcome: Progressing   
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
## 2021-10-27 NOTE — Progress Notes (Signed)
Patient seen and examined, no changes from my discharge summary yesterday ?-Remains medically stable for DC to SNF ? ?Domenic Polite, MD ?

## 2021-10-28 DIAGNOSIS — K22719 Barrett's esophagus with dysplasia, unspecified: Secondary | ICD-10-CM | POA: Diagnosis not present

## 2021-10-28 DIAGNOSIS — I5022 Chronic systolic (congestive) heart failure: Secondary | ICD-10-CM | POA: Diagnosis not present

## 2021-10-28 DIAGNOSIS — E113511 Type 2 diabetes mellitus with proliferative diabetic retinopathy with macular edema, right eye: Secondary | ICD-10-CM | POA: Diagnosis not present

## 2021-10-28 DIAGNOSIS — I110 Hypertensive heart disease with heart failure: Secondary | ICD-10-CM | POA: Diagnosis not present

## 2021-10-28 DIAGNOSIS — R5383 Other fatigue: Secondary | ICD-10-CM | POA: Diagnosis not present

## 2021-10-28 DIAGNOSIS — J90 Pleural effusion, not elsewhere classified: Secondary | ICD-10-CM | POA: Diagnosis not present

## 2021-10-28 DIAGNOSIS — M546 Pain in thoracic spine: Secondary | ICD-10-CM | POA: Diagnosis not present

## 2021-10-28 DIAGNOSIS — K449 Diaphragmatic hernia without obstruction or gangrene: Secondary | ICD-10-CM | POA: Diagnosis not present

## 2021-10-28 DIAGNOSIS — R0602 Shortness of breath: Secondary | ICD-10-CM | POA: Diagnosis not present

## 2021-10-28 DIAGNOSIS — E1159 Type 2 diabetes mellitus with other circulatory complications: Secondary | ICD-10-CM | POA: Diagnosis not present

## 2021-10-28 DIAGNOSIS — I517 Cardiomegaly: Secondary | ICD-10-CM | POA: Diagnosis not present

## 2021-10-28 DIAGNOSIS — T24009A Burn of unspecified degree of unspecified site of unspecified lower limb, except ankle and foot, initial encounter: Secondary | ICD-10-CM | POA: Diagnosis not present

## 2021-10-28 DIAGNOSIS — I5043 Acute on chronic combined systolic (congestive) and diastolic (congestive) heart failure: Secondary | ICD-10-CM | POA: Diagnosis not present

## 2021-10-28 DIAGNOSIS — N2581 Secondary hyperparathyroidism of renal origin: Secondary | ICD-10-CM | POA: Diagnosis not present

## 2021-10-28 DIAGNOSIS — I25708 Atherosclerosis of coronary artery bypass graft(s), unspecified, with other forms of angina pectoris: Secondary | ICD-10-CM | POA: Diagnosis not present

## 2021-10-28 DIAGNOSIS — Z992 Dependence on renal dialysis: Secondary | ICD-10-CM | POA: Diagnosis not present

## 2021-10-28 DIAGNOSIS — M545 Low back pain, unspecified: Secondary | ICD-10-CM | POA: Diagnosis not present

## 2021-10-28 DIAGNOSIS — I252 Old myocardial infarction: Secondary | ICD-10-CM | POA: Diagnosis not present

## 2021-10-28 DIAGNOSIS — Z7985 Long-term (current) use of injectable non-insulin antidiabetic drugs: Secondary | ICD-10-CM | POA: Diagnosis not present

## 2021-10-28 DIAGNOSIS — T24131D Burn of first degree of right lower leg, subsequent encounter: Secondary | ICD-10-CM | POA: Diagnosis not present

## 2021-10-28 DIAGNOSIS — Z9842 Cataract extraction status, left eye: Secondary | ICD-10-CM | POA: Diagnosis not present

## 2021-10-28 DIAGNOSIS — E113592 Type 2 diabetes mellitus with proliferative diabetic retinopathy without macular edema, left eye: Secondary | ICD-10-CM | POA: Diagnosis not present

## 2021-10-28 DIAGNOSIS — M1990 Unspecified osteoarthritis, unspecified site: Secondary | ICD-10-CM | POA: Diagnosis not present

## 2021-10-28 DIAGNOSIS — I25118 Atherosclerotic heart disease of native coronary artery with other forms of angina pectoris: Secondary | ICD-10-CM | POA: Diagnosis not present

## 2021-10-28 DIAGNOSIS — E785 Hyperlipidemia, unspecified: Secondary | ICD-10-CM | POA: Diagnosis not present

## 2021-10-28 DIAGNOSIS — I129 Hypertensive chronic kidney disease with stage 1 through stage 4 chronic kidney disease, or unspecified chronic kidney disease: Secondary | ICD-10-CM | POA: Diagnosis not present

## 2021-10-28 DIAGNOSIS — R5381 Other malaise: Secondary | ICD-10-CM | POA: Diagnosis not present

## 2021-10-28 DIAGNOSIS — F32A Depression, unspecified: Secondary | ICD-10-CM | POA: Diagnosis not present

## 2021-10-28 DIAGNOSIS — R0781 Pleurodynia: Secondary | ICD-10-CM | POA: Diagnosis not present

## 2021-10-28 DIAGNOSIS — Z87891 Personal history of nicotine dependence: Secondary | ICD-10-CM | POA: Diagnosis not present

## 2021-10-28 DIAGNOSIS — Z794 Long term (current) use of insulin: Secondary | ICD-10-CM | POA: Diagnosis not present

## 2021-10-28 DIAGNOSIS — R7981 Abnormal blood-gas level: Secondary | ICD-10-CM | POA: Diagnosis not present

## 2021-10-28 DIAGNOSIS — R0989 Other specified symptoms and signs involving the circulatory and respiratory systems: Secondary | ICD-10-CM | POA: Diagnosis not present

## 2021-10-28 DIAGNOSIS — I509 Heart failure, unspecified: Secondary | ICD-10-CM | POA: Diagnosis not present

## 2021-10-28 DIAGNOSIS — I493 Ventricular premature depolarization: Secondary | ICD-10-CM | POA: Diagnosis not present

## 2021-10-28 DIAGNOSIS — J9811 Atelectasis: Secondary | ICD-10-CM | POA: Diagnosis not present

## 2021-10-28 DIAGNOSIS — R531 Weakness: Secondary | ICD-10-CM | POA: Diagnosis not present

## 2021-10-28 DIAGNOSIS — Z8673 Personal history of transient ischemic attack (TIA), and cerebral infarction without residual deficits: Secondary | ICD-10-CM | POA: Diagnosis not present

## 2021-10-28 DIAGNOSIS — I5023 Acute on chronic systolic (congestive) heart failure: Secondary | ICD-10-CM | POA: Diagnosis not present

## 2021-10-28 DIAGNOSIS — H4313 Vitreous hemorrhage, bilateral: Secondary | ICD-10-CM | POA: Diagnosis not present

## 2021-10-28 DIAGNOSIS — I5089 Other heart failure: Secondary | ICD-10-CM | POA: Diagnosis not present

## 2021-10-28 DIAGNOSIS — N186 End stage renal disease: Secondary | ICD-10-CM | POA: Diagnosis not present

## 2021-10-28 DIAGNOSIS — I2510 Atherosclerotic heart disease of native coronary artery without angina pectoris: Secondary | ICD-10-CM | POA: Diagnosis not present

## 2021-10-28 DIAGNOSIS — I132 Hypertensive heart and chronic kidney disease with heart failure and with stage 5 chronic kidney disease, or end stage renal disease: Secondary | ICD-10-CM | POA: Diagnosis not present

## 2021-10-28 DIAGNOSIS — Z9841 Cataract extraction status, right eye: Secondary | ICD-10-CM | POA: Diagnosis not present

## 2021-10-28 DIAGNOSIS — K3189 Other diseases of stomach and duodenum: Secondary | ICD-10-CM | POA: Diagnosis not present

## 2021-10-28 DIAGNOSIS — J984 Other disorders of lung: Secondary | ICD-10-CM | POA: Diagnosis not present

## 2021-10-28 DIAGNOSIS — I10 Essential (primary) hypertension: Secondary | ICD-10-CM | POA: Diagnosis not present

## 2021-10-28 DIAGNOSIS — M6281 Muscle weakness (generalized): Secondary | ICD-10-CM | POA: Diagnosis not present

## 2021-10-28 DIAGNOSIS — E119 Type 2 diabetes mellitus without complications: Secondary | ICD-10-CM | POA: Diagnosis not present

## 2021-10-28 DIAGNOSIS — K2950 Unspecified chronic gastritis without bleeding: Secondary | ICD-10-CM | POA: Diagnosis not present

## 2021-10-28 DIAGNOSIS — I5020 Unspecified systolic (congestive) heart failure: Secondary | ICD-10-CM | POA: Diagnosis not present

## 2021-10-28 DIAGNOSIS — Z9889 Other specified postprocedural states: Secondary | ICD-10-CM | POA: Diagnosis not present

## 2021-10-28 DIAGNOSIS — H4312 Vitreous hemorrhage, left eye: Secondary | ICD-10-CM | POA: Diagnosis not present

## 2021-10-28 DIAGNOSIS — Z961 Presence of intraocular lens: Secondary | ICD-10-CM | POA: Diagnosis not present

## 2021-10-28 DIAGNOSIS — F419 Anxiety disorder, unspecified: Secondary | ICD-10-CM | POA: Diagnosis not present

## 2021-10-28 DIAGNOSIS — G4730 Sleep apnea, unspecified: Secondary | ICD-10-CM | POA: Diagnosis not present

## 2021-10-28 DIAGNOSIS — K219 Gastro-esophageal reflux disease without esophagitis: Secondary | ICD-10-CM | POA: Diagnosis not present

## 2021-10-28 DIAGNOSIS — E1122 Type 2 diabetes mellitus with diabetic chronic kidney disease: Secondary | ICD-10-CM | POA: Diagnosis not present

## 2021-10-28 DIAGNOSIS — N185 Chronic kidney disease, stage 5: Secondary | ICD-10-CM | POA: Diagnosis not present

## 2021-10-28 DIAGNOSIS — K2270 Barrett's esophagus without dysplasia: Secondary | ICD-10-CM | POA: Diagnosis not present

## 2021-10-28 DIAGNOSIS — D638 Anemia in other chronic diseases classified elsewhere: Secondary | ICD-10-CM | POA: Diagnosis not present

## 2021-10-28 DIAGNOSIS — R2689 Other abnormalities of gait and mobility: Secondary | ICD-10-CM | POA: Diagnosis not present

## 2021-10-28 DIAGNOSIS — N184 Chronic kidney disease, stage 4 (severe): Secondary | ICD-10-CM | POA: Diagnosis not present

## 2021-10-28 DIAGNOSIS — C159 Malignant neoplasm of esophagus, unspecified: Secondary | ICD-10-CM | POA: Diagnosis not present

## 2021-10-28 DIAGNOSIS — Z452 Encounter for adjustment and management of vascular access device: Secondary | ICD-10-CM | POA: Diagnosis not present

## 2021-10-28 DIAGNOSIS — E1151 Type 2 diabetes mellitus with diabetic peripheral angiopathy without gangrene: Secondary | ICD-10-CM | POA: Diagnosis not present

## 2021-10-28 DIAGNOSIS — N189 Chronic kidney disease, unspecified: Secondary | ICD-10-CM | POA: Diagnosis not present

## 2021-10-28 DIAGNOSIS — J449 Chronic obstructive pulmonary disease, unspecified: Secondary | ICD-10-CM | POA: Diagnosis not present

## 2021-10-28 DIAGNOSIS — I5042 Chronic combined systolic (congestive) and diastolic (congestive) heart failure: Secondary | ICD-10-CM | POA: Diagnosis not present

## 2021-10-28 DIAGNOSIS — F321 Major depressive disorder, single episode, moderate: Secondary | ICD-10-CM | POA: Diagnosis not present

## 2021-10-28 DIAGNOSIS — N1832 Chronic kidney disease, stage 3b: Secondary | ICD-10-CM | POA: Diagnosis not present

## 2021-10-28 DIAGNOSIS — Z951 Presence of aortocoronary bypass graft: Secondary | ICD-10-CM | POA: Diagnosis not present

## 2021-10-28 DIAGNOSIS — N183 Chronic kidney disease, stage 3 unspecified: Secondary | ICD-10-CM | POA: Diagnosis not present

## 2021-10-28 LAB — GLUCOSE, CAPILLARY: Glucose-Capillary: 122 mg/dL — ABNORMAL HIGH (ref 70–99)

## 2021-10-28 MED ORDER — TRAMADOL HCL 50 MG PO TABS: 25.0000 mg | ORAL_TABLET | Freq: Two times a day (BID) | ORAL | 0 refills | Status: DC | PRN

## 2021-10-28 NOTE — Consult Note (Signed)
[facility] Inpatient Consult   10/28/2021  [name] August 19, 1955 [id number]  THN Follow Up:  Per chart review, patient to discharge to [facility]. No [facility]N CM needs.  Of note, [facility] Care Management services does not replace or interfere with any services that are arranged by inpatient case management or social work.   Netta Cedars, MSN, RN [facility] Liaison Toll free office [phone]

## 2021-10-28 NOTE — Progress Notes (Addendum)
Patient is medically stable for discharge to SNF  Feeling well with no dyspnea or edema. Continue to be very weak and deconditioned, not back to his baseline.   BP (!) 151/78 (BP Location: Left Arm)   Pulse 72   Temp 98.4 F (36.9 C) (Oral)   Resp 18   Ht '5\' 10"'$  (1.778 m)   Wt 85.6 kg   SpO2 94%   BMI 27.08 kg/m   Neurology awake and alert ENT with no pallor Cardiovascular with S1 and S2 present and rhythmic with no gallops, rubs or murmurs No JVD.  Respiratory with no wheezing or rales Abdomen soft and non tender No lower extremity edema Skin right leg with dressing in place.   Decompensated heart failure/   Plan to continue medical therapy as per discharge summary from Dr Broadus John.

## 2021-10-28 NOTE — Progress Notes (Signed)
Physical Therapy Treatment Patient Details Name: [name] MRN: [id number] DOB: [birth year] Today's Date: 10/28/2021   History of Present Illness Pt is a 66 y/o male admitted secondary to increased weakness and falls. Found to have CHF exacerbation and anemia. PMH includes HTN, DM, CKD, CHF, CVA, and esophageal cancer.    PT Comments    The pt was agreeable to session, reports he is leaving for rehab today but eager to progress mobility. The pt was able to complete sit-stand transfers with minG and needed up to minA to steady while static stance without UE support to complete lower-body dressing. He was able to progress hallway ambulation, but maintains slowed speed with narrow BOS. No overt LOB this session, limited by impending arrival of PTAR for transport.     Recommendations for follow up therapy are one component of a multi-disciplinary discharge planning process, led by the attending physician.  Recommendations may be updated based on patient status, additional functional criteria and insurance authorization.  Follow Up Recommendations  Skilled nursing-short term rehab (<3 hours/day)     Assistance Recommended at Discharge Frequent or constant Supervision/Assistance  Patient can return home with the following A little help with walking and/or transfers;A little help with bathing/dressing/bathroom;Assistance with cooking/housework;Help with stairs or ramp for entrance;Assist for transportation;Direct supervision/assist for financial management;Direct supervision/assist for medications management   Equipment Recommendations  None recommended by PT    Recommendations for Other Services       Precautions / Restrictions Precautions Precautions: Fall Precaution Comments: multiple falls at home Restrictions Weight Bearing Restrictions: No     Mobility  Bed Mobility Overal bed mobility: Needs Assistance Bed Mobility: Supine to Sit     Supine to sit: HOB elevated, Min  guard     General bed mobility comments: increased time, pt needing cues to use bed rails rather than pulling on therapist    Transfers Overall transfer level: Needs assistance Equipment used: Rolling walker (2 wheels) Transfers: Sit to/from Stand Sit to Stand: Min guard           General transfer comment: minG to power up to standing, cues for hand placement, minG for safety once in standing but pt able to maintain without LOB with RW    Ambulation/Gait Ambulation/Gait assistance: Min assist Gait Distance (Feet): 45 Feet Assistive device: Rolling walker (2 wheels) Gait Pattern/deviations: Step-through pattern, Decreased stride length, Narrow base of support, Shuffle Gait velocity: decreased     General Gait Details: slow with narrow BOS and small steps, mild instability but no overt LOB. limited by arrival of PTAR for transport to rehab       Balance Overall balance assessment: Needs assistance Sitting-balance support: No upper extremity supported, Feet supported Sitting balance-Leahy Scale: Fair     Standing balance support: Bilateral upper extremity supported Standing balance-Leahy Scale: Poor Standing balance comment: Reliant on BUE support                            Cognition Arousal/Alertness: Awake/alert Behavior During Therapy: Flat affect Overall Cognitive Status: Impaired/Different from baseline Area of Impairment: Attention, Memory, Following commands, Safety/judgement, Awareness, Problem solving                   Current Attention Level: Focused Memory: Decreased recall of precautions Following Commands: Follows one step commands with increased time, Follows multi-step commands with increased time, Follows multi-step commands inconsistently Safety/Judgement: Decreased awareness of safety, Decreased awareness of deficits Awareness:  Emergent Problem Solving: Slow processing, Decreased initiation, Difficulty sequencing, Requires verbal  cues, Requires tactile cues General Comments: pt alert but with flat affect. pt soiled of urine due to malfunction of primo fit, but did not report to therapist even when asked. able to follow all cues and multi-step directions but is slowed.           General Comments General comments (skin integrity, edema, etc.): VSS on RA      Pertinent Vitals/Pain Pain Assessment Pain Assessment: No/denies pain     PT Goals (current goals can now be found in the care plan section) Acute Rehab PT Goals Patient Stated Goal: to go home PT Goal Formulation: With patient Time For Goal Achievement: 11/04/21 Potential to Achieve Goals: Good Progress towards PT goals: Progressing toward goals    Frequency    Min 2X/week      PT Plan Current plan remains appropriate       AM-PAC PT "6 Clicks" Mobility   Outcome Measure  Help needed turning from your back to your side while in a flat bed without using bedrails?: A Little Help needed moving from lying on your back to sitting on the side of a flat bed without using bedrails?: A Lot Help needed moving to and from a bed to a chair (including a wheelchair)?: A Little Help needed standing up from a chair using your arms (e.g., wheelchair or bedside chair)?: A Little Help needed to walk in hospital room?: A Little Help needed climbing 3-5 steps with a railing? : Total 6 Click Score: 15    End of Session Equipment Utilized During Treatment: Gait belt Activity Tolerance: Patient limited by fatigue Patient left: in chair;with call bell/phone within reach;with nursing/sitter in room Nurse Communication: Mobility status PT Visit Diagnosis: Unsteadiness on feet (R26.81);Muscle weakness (generalized) (M62.81);History of falling (Z91.81);Repeated falls (R29.6)     Time: 8466-5993 PT Time Calculation (min) (ACUTE ONLY): 19 min  Charges:  $Therapeutic Exercise: 8-22 mins                     West Carbo, PT, DPT   Acute Rehabilitation  Department Pager #: [phone]   Sandra Cockayne 10/28/2021, 10:14 AM

## 2021-10-28 NOTE — Care Management Important Message (Signed)
Important Message  Patient Details  Name: [name] MRN: [id number] Date of Birth: 1956/03/06   Medicare Important Message Given:  Yes     Shelda Altes 10/28/2021, 7:49 AM

## 2021-10-28 NOTE — TOC Transition Note (Signed)
Transition of Care [facility]) - CM/SW Discharge Note   Patient Details  Name: [name] MRN: [id number] Date of Birth: 01/13/1956  [facility]) CM/SW Contact:  Tresa Endo Phone Number: 10/28/2021, 9:06 AM   Clinical Narrative:    Patient will DC to: [facility]  Anticipated DC date: 10/28/2021 Family notified: Pt Caretaker/Friend Transport by: Corey Harold   Per MD patient ready for DC to [facility]. RN to call report prior to discharge [phone]). RN, patient, patient's family, and facility notified of DC. Discharge Summary and FL2 sent to facility. DC packet on chart. Ambulance transport requested for patient.   CSW will sign off for now as social work intervention is no longer needed. Please consult [place] again if new needs arise.       Barriers to Discharge: Ship broker, Continued Medical Work up   Patient Goals and CMS Choice Patient states their goals for this hospitalization and ongoing recovery are:: Pt declines goal setting at this time. CMS Medicare.gov Compare Post Acute Care list provided to:: Patient Represenative (must comment) ([name], Caregiver) Choice offered to / list presented to : Patient (Caregiver, Interior and spatial designer)  Discharge Placement                       Discharge Plan and Services     Post Acute Care Choice: Skilled Nursing Facility                               Social Determinants of Health ([name]) Interventions     Readmission Risk Interventions    08/30/2021    2:40 PM 07/26/2021    8:24 AM 03/29/2019   10:03 AM  Readmission Risk Prevention Plan  Transportation Screening Complete Complete Complete  PCP or Specialist Appt within 5-7 Days   Complete  Home Care Screening   Complete  Medication Review (RN CM)   Complete  HRI or Home Care Consult  Complete   Social Work Consult for [place] Planning/Counseling  Complete   Palliative Care Screening  Not Applicable   Medication Review Designer, fashion/clothing) Complete Complete   PCP or Specialist appointment within 3-5 days of discharge Not Complete    HRI or [place] Complete    SW Recovery Care/Counseling Consult Complete    [place] Not Applicable

## 2021-10-28 NOTE — Plan of Care (Signed)
Patient discharged to [facility] via PTR. Report called to Mariann Laster and AVS with RX sent with patient.   Problem: Education: Goal: Knowledge of General Education information will improve Description: Including pain rating scale, medication(s)/side effects and non-pharmacologic comfort measures Outcome: Adequate for Discharge   Problem: Health Behavior/Discharge Planning: Goal: Ability to manage health-related needs will improve Outcome: Adequate for Discharge   Problem: Clinical Measurements: Goal: Ability to maintain clinical measurements within normal limits will improve Outcome: Adequate for Discharge Goal: Will remain free from infection Outcome: Adequate for Discharge Goal: Diagnostic test results will improve Outcome: Adequate for Discharge Goal: Respiratory complications will improve Outcome: Adequate for Discharge Goal: Cardiovascular complication will be avoided Outcome: Adequate for Discharge   Problem: Activity: Goal: Risk for activity intolerance will decrease Outcome: Adequate for Discharge   Problem: Nutrition: Goal: Adequate nutrition will be maintained Outcome: Adequate for Discharge   Problem: Coping: Goal: Level of anxiety will decrease Outcome: Adequate for Discharge   Problem: Elimination: Goal: Will not experience complications related to bowel motility Outcome: Adequate for Discharge Goal: Will not experience complications related to urinary retention Outcome: Adequate for Discharge   Problem: Pain Managment: Goal: General experience of comfort will improve Outcome: Adequate for Discharge   Problem: Safety: Goal: Ability to remain free from injury will improve Outcome: Adequate for Discharge   Problem: Skin Integrity: Goal: Risk for impaired skin integrity will decrease Outcome: Adequate for Discharge

## 2021-10-31 ENCOUNTER — Other Ambulatory Visit: Payer: Self-pay | Admitting: Family Medicine

## 2021-11-01 ENCOUNTER — Encounter (HOSPITAL_BASED_OUTPATIENT_CLINIC_OR_DEPARTMENT_OTHER): Payer: Self-pay | Admitting: Family

## 2021-11-01 ENCOUNTER — Ambulatory Visit (INDEPENDENT_AMBULATORY_CARE_PROVIDER_SITE_OTHER): Payer: Medicare HMO | Admitting: Family

## 2021-11-01 VITALS — BP 140/72 | HR 72 | Ht 70.0 in | Wt 185.1 lb

## 2021-11-01 DIAGNOSIS — I5042 Chronic combined systolic (congestive) and diastolic (congestive) heart failure: Secondary | ICD-10-CM | POA: Diagnosis not present

## 2021-11-01 DIAGNOSIS — E785 Hyperlipidemia, unspecified: Secondary | ICD-10-CM

## 2021-11-01 DIAGNOSIS — R5383 Other fatigue: Secondary | ICD-10-CM | POA: Diagnosis not present

## 2021-11-01 DIAGNOSIS — I5020 Unspecified systolic (congestive) heart failure: Secondary | ICD-10-CM | POA: Diagnosis not present

## 2021-11-01 DIAGNOSIS — I25118 Atherosclerotic heart disease of native coronary artery with other forms of angina pectoris: Secondary | ICD-10-CM | POA: Diagnosis not present

## 2021-11-01 DIAGNOSIS — N184 Chronic kidney disease, stage 4 (severe): Secondary | ICD-10-CM | POA: Diagnosis not present

## 2021-11-01 DIAGNOSIS — I10 Essential (primary) hypertension: Secondary | ICD-10-CM | POA: Diagnosis not present

## 2021-11-01 DIAGNOSIS — F32A Depression, unspecified: Secondary | ICD-10-CM

## 2021-11-01 DIAGNOSIS — I493 Ventricular premature depolarization: Secondary | ICD-10-CM

## 2021-11-01 MED ORDER — HYDRALAZINE HCL 100 MG PO TABS: 100.0000 mg | ORAL_TABLET | Freq: Three times a day (TID) | ORAL | Status: DC

## 2021-11-01 MED ORDER — TORSEMIDE 40 MG PO TABS: 20.0000 mg | ORAL_TABLET | Freq: Every day | ORAL | 1 refills | Status: DC | PRN

## 2021-11-01 NOTE — Patient Instructions (Addendum)
Medication Instructions:  Your physician has recommended you make the following change in your medication:   CHANGE Hydroxyzine to as needed for allergies or insomnia  CHANGE Carvedilol to 12.'5mg'$   twice daily   *If you need a refill on your cardiac medications before your next appointment, please call your pharmacy*   Lab Work: Your physician recommends that you return for lab work today: CBC, TSH  If you have labs (blood work) drawn today and your tests are completely normal, you will receive your results only by: [place] (if you have [place]) OR A paper copy in the mail If you have any lab test that is abnormal or we need to change your treatment, we will call you to review the results.   Testing/Procedures: None ordered today.    Follow-Up: At [facility], you and your health needs are our priority.  As part of our continuing mission to provide you with exceptional heart care, we have created designated Provider Care Teams.  These Care Teams include your primary Cardiologist (physician) and Advanced Practice Providers (APPs -  Physician Assistants and Nurse Practitioners) who all work together to provide you with the care you need, when you need it.  We recommend signing up for the patient portal called "MyChart".  Sign up information is provided on this After Visit Summary.  MyChart is used to connect with patients for Virtual Visits (Telemedicine).  Patients are able to view lab/test results, encounter notes, upcoming appointments, etc.  Non-urgent messages can be sent to your provider as well.   To learn more about what you can do with MyChart, go to [email or web address].    Your next appointment:   As scheduled with Dr. Oval Linsey in July  [place] when you are discharged from [facility].    Other Instructions  TO [place]:  Please weigh daily and keep log. Use PRN Torsemide for weight gain of weight gain of 2 pounds  overnight or 5 pounds in one week.   Contact cardiology office for blood pressure consistently <110/60 or >140/90.

## 2021-11-01 NOTE — Progress Notes (Signed)
Office Visit    Patient Name: [name] Date of Encounter: 11/01/2021  PCP:  [name], MD   [place]  Cardiologist:  Skeet Latch, MD Advanced Practice Provider:  No care team member to display Electrophysiologist:  None   Chief Complaint    [name] is a 66 y.o. male with a hx of CVA, combined systolic and diastolic heart failure, PAD, esophageal cancer, CAD post CABG, HTN, MI, DM2, CKD 3, PVC  presents today for hospital follow up.  Past Medical History    Past Medical History:  Diagnosis Date   Anemia    Arthritis    hips shoulders   CAD (coronary artery disease) 03/03/2019   Cancer ([place])    Esophageal cancer 2022   CHF (congestive heart failure) ([place]) 2020   Chronic combined systolic and diastolic heart failure ([place]) 04/26/2018   Admitted 11/14-11/20/19-diuresed 10L   Depression 05/31/2021   Fatigue 10/14/2020   Hypertension    Myocardial infarction [facility])    Sleep apnea    uses a bipap machine   Stroke ([place]) 12/14/2020   no last weakness or paralysis   Type 2 diabetes mellitus with diabetic nephropathy ([place]) 04/23/2019   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   Past Surgical History:  Procedure Laterality Date   BIOPSY  06/27/2019   Procedure: BIOPSY;  Surgeon: Rogene Houston, MD;  [facility];  Service: Endoscopy;;  ascending colon polyp   BIOPSY  10/21/2020   Procedure: BIOPSY;  Surgeon: Rogene Houston, MD;  [facility];  Service: Endoscopy;;  esophageal,gastric polyp   BIOPSY  02/24/2021   Procedure: BIOPSY;  Surgeon: Rogene Houston, MD;  [facility];  Service: Endoscopy;;  distal and proximal esophageal biopsies    CARDIAC SURGERY     CATARACT EXTRACTION     COLONOSCOPY N/A 06/27/2019   [name]:Diverticulosis in the entire examined colon. tubular adenoma in ascending colon, 5m tubular adenoma in prox sigmoid. external hemorrhoids   CORONARY ARTERY BYPASS GRAFT N/A  03/25/2019   Procedure: CORONARY ARTERY BYPASS GRAFTING (CABG) X  4 USING LEFT INTERNAL MAMMARY ARTERY AND RIGHT SAPHENOUS VEIN GRAFTS;  Surgeon: LLajuana Matte MD;  [place] [facility]  Service: Open Heart Surgery;  Laterality: N/A;   ESOPHAGOGASTRODUODENOSCOPY (EGD) WITH PROPOFOL N/A 10/21/2020   rehman:Normal hypopharynx.Normal proximal esophagus and mid esophagus.Esophageal mucosal changes consistent with long-segment Barrett's esophagus. (Focal low-grade dysplasia and atypia proximally) z line irregular 37 cm from incisors, 3 cm HH, single gastric polyp (fundic gland) normal duodenal bulb/second portion of duodenum, proximal margin of Barrett's at 34 cm   ESOPHAGOGASTRODUODENOSCOPY (EGD) WITH PROPOFOL N/A 02/24/2021   Procedure: ESOPHAGOGASTRODUODENOSCOPY (EGD) WITH PROPOFOL;  Surgeon: RRogene Houston MD;  [facility];  Service: Endoscopy;  Laterality: N/A;  1:35   ESOPHAGOGASTRODUODENOSCOPY (EGD) WITH PROPOFOL N/A 03/21/2021   Procedure: ESOPHAGOGASTRODUODENOSCOPY (EGD) WITH PROPOFOL;  Surgeon: HCarol Ada MD;  [place] [facility]  Service: Endoscopy;  Laterality: N/A;   ESOPHAGOGASTRODUODENOSCOPY (EGD) WITH PROPOFOL N/A 10/23/2021   Procedure: ESOPHAGOGASTRODUODENOSCOPY (EGD) WITH PROPOFOL;  Surgeon: DDoran Stabler MD;  [place] [facility]  Service: Gastroenterology;  Laterality: N/A;   EXCISION MORTON'S NEUROMA     EYE SURGERY Left    retina   INCISION AND DRAINAGE OF WOUND Right 06/28/2021   Procedure: IRRIGATION AND DEBRIDEMENT WOUND;  Surgeon: PCindra Presume MD;  Location: [facility]  Service: Plastics;  Laterality: Right;  1.5 hour   POLYPECTOMY  06/27/2019   Procedure: POLYPECTOMY;  Surgeon: Rogene Houston, MD;  [facility];  Service: Endoscopy;;  proximal sigmoid colon   RIGHT/LEFT HEART CATH AND CORONARY ANGIOGRAPHY N/A 03/12/2019   Procedure: RIGHT/LEFT HEART CATH AND CORONARY ANGIOGRAPHY;  Surgeon: [name], MD;  Location: [place] [facility];  Service: Cardiovascular;  Laterality: N/A;   SHOULDER ARTHROSCOPY WITH ROTATOR CUFF REPAIR AND SUBACROMIAL DECOMPRESSION Right 08/26/2020   Procedure: RIGHT SHOULDER MINI OPEN ROTATOR CUFF REPAIR AND SUBACROMIAL DECOMPRESSION WITH PATCH GRAFT;  Surgeon: Susa Day, MD;  Location: [facility];  Service: Orthopedics;  Laterality: Right;  90 MINS GENERAL WITH BLOCK   SKIN SPLIT GRAFT Right 06/28/2021   Procedure: SKIN GRAFT SPLIT THICKNESS;  Surgeon: [name], MD;  Location: [place];  Service: Plastics;  Laterality: Right;   TEE WITHOUT CARDIOVERSION N/A 03/25/2019   Procedure: TRANSESOPHAGEAL ECHOCARDIOGRAM (TEE);  Surgeon: [name], MD;  Location: [place];  Service: Open Heart Surgery;  Laterality: N/A;   Allergies  Allergies  Allergen Reactions   Bee Venom Anaphylaxis   Atorvastatin     myalgia   Diltiazem Itching   Reglan [Metoclopramide] Other (See Comments)    Suicidal    Rosuvastatin     myalgias   Valsartan Itching    History of Present Illness    [name] is a 66 y.o. male with a hx of CVA, combined systolic and diastolic heart failure, PAD, esophageal cancer, CAD post CABG, HTN, MI, DM2, CKD 3, PVC last seen 09/14/21. Pleasant gentleman who previously worked as a Forensic psychologist in [employer].  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  Blood pressure control has been difficult.  He has been intolerant to amlodipine, lisinopril, hydrochlorothiazide, beta-blocker due to sensation of fatigue.  He has been started on Lasix by Dr. Moshe Cipro which has improved his lower extremity edema.  At initial appointment he was started on doxazosin.  Follow-up with pharmacist to be started on valsartan.  He then had subsequent orthostatic blood pressures and doxazosin was reduced.  He was evaluated in the ED 12/14/2020 noting dizziness, loss of balance.  He was not orthostatic.  MRI showed small subacute to chronic infarct in left centrum semiovale and no  obvious to LVO. He was started in aspirin and recommended for outpatient follow-up with neurology at which time ASA was increased to '325mg'$ . ZIO ordered 12/24/20 due to lightheadedness with predominantly NSR, 3.5% PVC burden, 3 episodes NSVT up to 5 beats and up to 12 runs of SVT.    Carotid duplex 01/21/21 at outside provider with no significant stenosis bilaterally. Due to exertional dyspnea myoview performed 02/2021 with infarct with mild peri-infarct ischemia recommended for medical management given CKD. When seen 05/2021 was referred to psychology due to worsening depression in setting of multiple medical issues over the last year.   He was seen in clinic 08/04/21 noting weakness, depression, and volume up 6 pounds. Torsemide dose increased for 4 days, referred to SW, referred to home health as well as re-referred to psychology. He was seen 08/25/21 with weight up but edema decreased and same Torsemide dose continued.   Hospitalized 08/29/21-09/01/21 for decompensated heart failure with weight gain of 15 lbs. He was treated with IV diuresis and discharged on Torsemide '40mg'$  QD. Diuresed 10 pounds with discharge weight 203.2 lbs.   Last seen 09/14/2021. His weight was stable at 204 lbs and encouraged to continue Torsemide '40mg'$  QD with additional  $'40mg'a$  PRN.   Admitted 4/18 - 10/04/2021 to [facility] due to hematemesis.  Underwent EGD with RFA 09/27/2021.  Hemoglobin remained stable without transfusion.  GI felt symptoms were treated to recent RFA and did not recommend repeat endoscopy.  Aspirin was able to be resumed on discharge.  He underwent PET scan due to known esophageal adenocarcinoma which oncology felt represented local irritation/inflammation and not progression.  He was discharged with home health.  Admitted 5/10 - 10/28/2021 to [facility] with acute on chronic heart failure.  He was diuresed 8.2 L and down 17 pounds.  He was discharged on torsemide 40 mg daily.  He required 4 units PRBC due to anemia.   Underwent EGD with esophageal stenosis, circumferential clean ulceration in distal esophagus. Discharge weight 187.66.  Presents today for follow up independently. Weight is down 2 pounds since hospital discharge. From paperwork from SNF his Torsemide has been reduced to '20mg'$  PRN. His Hydralazine has been increased to '100mg'$  TID and Carvedilol only being provided once per day. He is enjoying working with PT/OT at the SNF. Feels his stamina is improving. Still with occasional lightheadedness particularly with position changes. BP from SNF has been 140s. Exertional dyspnea stable at baseline. No edema, orthopnea, PND, chest pain, hematuria, melena. Still with difficulties with depression and fatigue, but no SI.  EKGs/Labs/Other Studies Reviewed:   The following studies were reviewed today:  Echo 04/26/2018: - Left ventricle: The cavity size was normal. There was minimal    concentric hypertrophy. Systolic function was mildly reduced. The    estimated ejection fraction was in the range of 45% to 50%.    Diffuse hypokinesis. Findings consistent with left ventricular    diastolic dysfunction, grade indeterminate. Doppler parameters    are consistent with high ventricular filling pressure.  - Mitral valve: Mildly calcified annulus. There was mild    regurgitation.  - Left atrium: The atrium was severely dilated.  - Right atrium: The atrium was mildly dilated.  - Inferior vena cava: The vessel was dilated. The respirophasic    diameter changes were blunted (< 50%), consistent with elevated    central venous pressure. Estimated CVP 15 mmHg.    ___________________________________________________________  Right/Left Heart Cath and Coronary Angiography 03/12/2019: Mid LAD lesion is 80% stenosed. Ost LAD to Mid LAD lesion is 50% stenosed. 1st Diag lesion is 80% stenosed. 2nd Diag lesion is 75% stenosed. Mid LM to Dist LM lesion is 25% stenosed. Prox Cx lesion is 90% stenosed. 1st Mrg lesion is 80%  stenosed. 2nd Mrg lesion is 80% stenosed. Dist RCA lesion is 90% stenosed. Mid RCA lesion is 75% stenosed. RPAV lesion is 80% stenosed. LV end diastolic pressure is moderately elevated. There is no aortic valve stenosis. Hemodynamic findings consistent with mild pulmonary hypertension.   Severe multivessel CAD.  Plan for outpatient evaluation for CABG>  His symptoms are all with exertion.  He feels fine at rest.  I instructed him to avoid any strenuous activity, or anything that would bring on shortness of breath.  Continue aspirin and Imdur.   Continue Crestor.    ___________________________________________________________  Echo 03/15/2019  1. Left ventricular ejection fraction, by visual estimation, is 45 to  50%. The left ventricle has mildly decreased function. There is mildly  increased left ventricular hypertrophy.   2. Left ventricular diastolic Doppler parameters are consistent with  impaired relaxation pattern of LV diastolic filling.   3. Global right ventricle has normal systolic function.The right  ventricular size is normal. No  increase in right ventricular wall  thickness.   4. Left atrial size was severely dilated.   5. Right atrial size was normal.   6. Mild mitral annular calcification.   7. Moderate calcification of the mitral valve leaflet(s).   8. Moderate thickening of the mitral valve leaflet(s).   9. The mitral valve is abnormal. Trace mitral valve regurgitation. No  evidence of mitral stenosis.  10. The tricuspid valve is normal in structure. Tricuspid valve  regurgitation is trivial.  11. The aortic valve is tricuspid Aortic valve regurgitation was not  visualized by color flow Doppler. Mild aortic valve sclerosis without  stenosis.  12. The pulmonic valve was not well visualized. Pulmonic valve  regurgitation is mild by color flow Doppler.  13. Mildly elevated pulmonary artery systolic pressure.    ___________________________________________________________  VAS US DOPPLER PRE CABG 03/21/2019:  Right Carotid: Velocities in the right ICA are consistent with a 1-39%  stenosis.   Left Carotid: There is no evidence of stenosis in the left ICA.  Vertebrals: Bilateral vertebral arteries demonstrate antegrade flow.   Bilateral Extremity: Doppler waveforms remain within normal limits with  compression bilaterally for the radial arteries. Doppler waveforms remain  within normal limits with compression bilaterally for the ulnar arteries.   ___________________________________________________________  ECHO INTRAOPERATIVE TEE 03/25/2019: POST-OP IMPRESSIONS  - Aorta: The aorta appears unchanged from pre-bypass.  - Left Atrial Appendage: The left atrial appendage appears unchanged from  pre-bypass.  - Aortic Valve: The aortic valve appears unchanged from pre-bypass.  - Mitral Valve: The mitral valve appears unchanged from pre-bypass.  - Tricuspid Valve: The tricuspid valve appears unchanged from pre-bypass.  - Pericardium: The pericardium appears unchanged from pre-bypass.   ___________________________________________________________  VAS [place] LOWER EXTREMITY VENOUS 03/30/2019: Right: There is no evidence of deep vein thrombosis in the lower  extremity.  Left: No evidence of common femoral vein obstruction.   ___________________________________________________________  Echo 05/29/2019 1. Left ventricular ejection fraction, by visual estimation, is 50 to  55%. The left ventricle has low normal function. There is mildly increased  left ventricular hypertrophy.   2. Basal anterolateral segment, basal inferior segment, and basal  inferolateral segment are abnormal.   3. The left ventricle demonstrates regional wall motion abnormalities.   4. Global right ventricle has mildly reduced systolic function.The right  ventricular size is normal. No increase in right ventricular wall  thickness.    5. Left atrial size was moderately dilated.   6. Right atrial size was normal.   7. Moderate mitral annular calcification.   8. The mitral valve is grossly normal. Trivial mitral valve  regurgitation.   9. The tricuspid valve is grossly normal. Tricuspid valve regurgitation  is trivial.  10. The aortic valve is tricuspid. Aortic valve regurgitation is not  visualized.  11. The pulmonic valve was grossly normal. Pulmonic valve regurgitation is  trivial.  12. Normal pulmonary artery systolic pressure.  13. The tricuspid regurgitant velocity is 2.30 m/s, and with an assumed  right atrial pressure of 3 mmHg, the estimated right ventricular systolic  pressure is normal at 24.2 mmHg.  14. The inferior vena cava is normal in size with greater than 50%  respiratory variability, suggesting right atrial pressure of 3 mmHg.   ___________________________________________________________  Echo 09/2019:  1. Left ventricular ejection fraction, by estimation, is 55 to 60%. The  left ventricle has normal function. The left ventricle has no regional  wall motion abnormalities. There is moderate left ventricular hypertrophy.  Left ventricular diastolic  parameters are consistent with Grade I diastolic dysfunction (impaired  relaxation).   2. Right ventricular systolic function is normal. The right ventricular  size is normal.   3. Left atrial size was severely dilated.   4. The mitral valve is normal in structure. Trivial mitral valve  regurgitation. No evidence of mitral stenosis.   5. The aortic valve was not well visualized. Aortic valve regurgitation  is not visualized. No aortic stenosis is present.   6. The inferior vena cava is normal in size with greater than 50%  respiratory variability, suggesting right atrial pressure of 3 mmHg.   ___________________________________________________________  Carotid duplex 01/21/21  Right: Mild to moderate partially calcified plaque. No significant  stenosis.  Left: Mild partially calcified plaque. No signifcant stenosis.   ___________________________________________________________  Brantley Fling 02/2021 Findings are consistent with prior myocardial infarction with peri-infarct ischemia. The study is high risk.   No ST deviation was noted.   LV perfusion is abnormal. There is evidence of ischemia. There is evidence of infarction. Defect 1: There is a medium defect with moderate reduction in uptake present in the mid to basal inferior and inferolateral location(s) that is fixed. There is abnormal wall motion in the defect area. Consistent with infarction and peri-infarct ischemia.   Left ventricular function is abnormal. Global function is severely reduced. There was a single regional abnormality. Nuclear stress EF: 28 %. The left ventricular ejection fraction is severely decreased (<30%). End diastolic cavity size is moderately enlarged. End systolic cavity size is moderately enlarged.   Prior study available for comparison from 09/25/2019. There are changes compared to prior study. The left ventricular ejection fraction has decreased. LVEF 35%, moderate size and intensity fixed inferior defect suggestive of scar with mild peri-infarct ischemia   Moderate size and intensity, mostly fixed basal to mid inferior and inferolateral perfusion defect (SDS 1), suggestive of scar with minimal peri-infarct ischemia. LVEF 28% with global hypokinesis and basal to mid inferior akinesis. This is a high risk study. In comparison with a prior study in 2021, the LVEF is lower (was previously 35%), however, there are no new reversible perfusion defects.  ___________________________________________________________  Echo 07/25/21  1. Left ventricular ejection fraction, by estimation, is 40 to 45%. The  left ventricle has mildly decreased function. The left ventricle  demonstrates regional wall motion abnormalities (see scoring  diagram/findings for description). There is  mild  concentric left ventricular hypertrophy. Left ventricular diastolic  function could not be evaluated. There is akinesis of the left  ventricular, basal inferior wall and inferoseptal wall.   2. Right ventricular systolic function is moderately reduced. The right  ventricular size is severely enlarged. There is moderately elevated  pulmonary artery systolic pressure. The estimated right ventricular  systolic pressure is 02.7 mmHg.   3. Left atrial size was moderately dilated.   4. The mitral valve is degenerative. Mild to moderate mitral valve  regurgitation. No evidence of mitral stenosis.   5. The aortic valve is normal in structure. Aortic valve regurgitation is  not visualized. No aortic stenosis is present.   6. Aortic dilatation noted. There is mild dilatation of the aortic root,  measuring 39 mm.   7. The inferior vena cava is dilated in size with <50% respiratory  variability, suggesting right atrial pressure of 15 mmHg.   EKG:  No EKG today  Recent Labs: 05/31/2021: TSH 1.890 10/20/2021: ALT 13; B Natriuretic Peptide 3,286.7 10/23/2021: Magnesium 2.1 10/27/2021: BUN 57; Creatinine, Ser 3.51; Hemoglobin 8.3; Platelets 208; Potassium  3.7; Sodium 136  Recent Lipid Panel    Component Value Date/Time   CHOL 168 12/25/2020 1208   TRIG 272 (H) 12/25/2020 1208   HDL 48 12/25/2020 1208   CHOLHDL 3.5 12/25/2020 1208   CHOLHDL 3.9 04/26/2018 1011   VLDL 20 04/26/2018 1011   LDLCALC 76 12/25/2020 1208    Home Medications   Current Meds  Medication Sig   acetaminophen (TYLENOL) 325 MG tablet Take 2 tablets (650 mg total) by mouth every 6 (six) hours as needed for mild pain, moderate pain or fever.   albuterol (VENTOLIN HFA) 108 (90 Base) MCG/ACT inhaler Inhale 2 puffs into the lungs every 6 (six) hours as needed. (Patient taking differently: Inhale 2 puffs into the lungs every 6 (six) hours as needed for wheezing or shortness of breath.)   ascorbic acid (VITAMIN C) 500 MG  tablet Take 1 tablet (500 mg total) by mouth daily.   carvedilol (COREG) 12.5 MG tablet Take 1 tablet (12.5 mg total) by mouth 2 (two) times daily.   Cholecalciferol (VITAMIN D3 PO) Take 1,000 Units by mouth daily.   doxazosin (CARDURA) 4 MG tablet Take 1 tablet (4 mg total) by mouth daily.   EPINEPHrine 0.3 mg/0.3 mL IJ SOAJ injection Inject 0.3 mg into the muscle as needed for anaphylaxis.   Insulin Pen Needle (B-D ULTRAFINE III SHORT PEN) 31G X 8 MM MISC 1 each by Does not apply route as directed.   isosorbide mononitrate (IMDUR) 30 MG 24 hr tablet Take 0.5 tablets (15 mg total) by mouth daily.   Multiple Vitamin (MULTIVITAMIN WITH MINERALS) TABS tablet Take 1 tablet by mouth daily.   nitroGLYCERIN (NITROSTAT) 0.4 MG SL tablet Place 1 tablet (0.4 mg total) under the tongue every 5 (five) minutes as needed for chest pain.   ondansetron (ZOFRAN-ODT) 4 MG disintegrating tablet TAKE 1 TABLET BY MOUTH EVERY 8 HOURS AS NEEDED FOR NAUSEA AND VOMITING (Patient taking differently: Take 4 mg by mouth every 8 (eight) hours as needed for nausea or vomiting.)   pantoprazole (PROTONIX) 40 MG tablet Take 1 tablet (40 mg total) by mouth 2 (two) times daily before a meal.   REPATHA SURECLICK 409 MG/ML SOAJ Inject 140 mg as directed every 14 (fourteen) days.   Semaglutide (OZEMPIC, 1 MG/DOSE, [place]) Inject 1 mg into the skin once a week.   sertraline (ZOLOFT) 100 MG tablet Take 1 and half tablet  by mouth daily (Patient taking differently: Take 150 mg by mouth daily. Take 1 and half tablet  by mouth daily)   sucralfate (CARAFATE) 1 GM/10ML suspension Take 10 mLs (1 g total) by mouth 3 (three) times daily before meals.   traMADol (ULTRAM) 50 MG tablet Take 0.5 tablets (25 mg total) by mouth every 12 (twelve) hours as needed for moderate pain.   [DISCONTINUED] hydrALAZINE (APRESOLINE) 100 MG tablet Take 0.5 tablets (50 mg total) by mouth 3 (three) times daily. (Patient taking differently: Take 100 mg by mouth 3 (three)  times daily.)   [DISCONTINUED] hydrOXYzine (VISTARIL) 25 MG capsule 1 nightly as needed allergies and insomnia   [DISCONTINUED] Torsemide 40 MG TABS Take 40 mg by mouth daily. Take additional tablet in the afternoon as needed for weight gain of 2 pounds overnight or 5 pounds in one week. (Patient taking differently: Take 20 mg by mouth daily as needed. As needed for weight gain of 2 pounds overnight or 5 pounds in one week.)     Review of Systems  All other systems reviewed and are otherwise negative except as noted above.  Physical Exam    VS:  BP 140/72 (BP Location: Right Arm, Patient Position: Sitting, Cuff Size: Normal)   Pulse 72   Ht '5\' 10"'$  (1.778 m)   Wt 185 lb 1.6 oz (84 kg)   SpO2 91%   BMI 26.56 kg/m  , BMI Body mass index is 26.56 kg/m.  Wt Readings from Last 3 Encounters:  11/01/21 185 lb 1.6 oz (84 kg)  10/28/21 188 lb 11.4 oz (85.6 kg)  10/07/21 193 lb 12.8 oz (87.9 kg)    GEN: Well nourished, well developed, in no acute distress. HEENT: normal. Neck: Supple, no JVD, carotid bruits, or masses. Cardiac: RRR, no murmurs, rubs, or gallops. No clubbing, cyanosis, edema.  Radials/PT 2+ and equal bilaterally.  Respiratory:  Respirations regular and unlabored, clear to auscultation bilaterally. GI: Soft, nontender, nondistended. MS: No deformity or atrophy. Skin: Warm and dry, no rash. Neuro:  Strength and sensation are intact. Psych: Normal affect.  Assessment & Plan    CAD s/p CABG - Myoview 02/2021 with infarct with mild peri-infarct ischemia recommended for medical management given CKD. GDMT includes aspirin, Repatha, Carvedilol. No anginal symptoms. No indication for ischemic evaluation at this time.  Heart healthy diet and regular cardiovascular exercise encouraged.    PVC / SVT / VT -ZIO 12/2020 with predominantly NSR with PVC burden 3.5%, 3 episodes NSVT up to 5 beats and up to 12 runs of SVT.Denies palpitations. Taking Coreg 12.'5mg'$  QD per SNF records,  increase to BID for all day coverage. Diltiazem contraindicated in setting of HF.   HLD - Statin intolerance. On Repatha.  HTN-  BP relatively well controlled. Given some positional lightheadedness concerning for orthostatic hypotension, will continue antihypertensive regimen: Coreg 12.'5mg'$  BID, Doxazosin '4mg'$  QD, Imdur '15mg'$  BID, Hydralazine '100mg'$  TID. Provided instruction for SNF to contact our office if BP consistently >140/90.   HFrEF - 07/2021 LVEF 40-45%. GDMT limited by renal function. Recent admission with HF exacerbation precipitated by anemia requiring 4u PRBC. Discharge weight 187 now 185 lbs. Continue Torsemide '20mg'$  PRN for weight gain of 2 lbs overnight or 5 lbs in one week at SNF. May require daily dosing when he returns home as unsure he will comply with daily weights. Present GDMT includes Coreg, Hydralazine, Imdur, Torsemide. Fluid restriction <2L and low sodium diet encouraged. Daily weights encouraged at SNF.    Depression - Follows with PCP. On Zoloft. Encouraged to discuss possible alternate agent with PCP as not in remission. Has been referred to psychology Dr. Michail Sermon - they were unable to reach patient to schedule appointment. [name] is understandably frustrated with depression and its affect on his wellbeing. Anticipate fatigue due to recent admissions, depression but will check TSH to rule out thyroid abnormality.  Reported side effects on Seroquel.Encouraged to discuss with PCP.   History of CVA- Follows with neurology. Continue repatha, aspirin '325mg'$  QD.  CKD - Follows with Dr. Clover Mealy of nephrology. Careful titration of diuretic and antihypertensive  Esophageal cancer / anemia - Follows with oncology. Update CBC today for monitoring.   Disposition: Follow up in July with Dr. Oval Linsey.  Signed, Loel Dubonnet, NP 11/01/2021, 8:22 PM [place]

## 2021-11-02 ENCOUNTER — Telehealth (HOSPITAL_BASED_OUTPATIENT_CLINIC_OR_DEPARTMENT_OTHER): Payer: Self-pay

## 2021-11-02 LAB — CBC
Hematocrit: 28.1 % — ABNORMAL LOW (ref 37.5–51.0)
Hemoglobin: 9 g/dL — ABNORMAL LOW (ref 13.0–17.7)
MCH: 28.1 pg (ref 26.6–33.0)
MCHC: 32 g/dL (ref 31.5–35.7)
MCV: 88 fL (ref 79–97)
Platelets: 254 10*3/uL (ref 150–450)
RBC: 3.2 x10E6/uL — ABNORMAL LOW (ref 4.14–5.80)
RDW: 15.9 % — ABNORMAL HIGH (ref 11.6–15.4)
WBC: 7.2 10*3/uL (ref 3.4–10.8)

## 2021-11-02 LAB — TSH: TSH: 2.49 u[IU]/mL (ref 0.450–4.500)

## 2021-11-02 NOTE — Telephone Encounter (Addendum)
Results released via mychart per patient request.   RN called facility and fax number [phone], faxed 5/23   ----- Message from Loel Dubonnet, NP sent at 11/02/2021 12:02 PM EDT ----- Normal thyroid function. CBC shows blood counts improving since hospital discharge. Continue current plan of care.   Please fax results to [facility] [phone number [phone]. Discussed with patient in clinic that we would send results via MyChart and fax to SNF as his phone is not working.

## 2021-11-04 ENCOUNTER — Ambulatory Visit: Payer: Medicare HMO | Admitting: Family Medicine

## 2021-11-09 DIAGNOSIS — Z951 Presence of aortocoronary bypass graft: Secondary | ICD-10-CM | POA: Diagnosis not present

## 2021-11-09 DIAGNOSIS — J9811 Atelectasis: Secondary | ICD-10-CM | POA: Diagnosis not present

## 2021-11-09 DIAGNOSIS — R0781 Pleurodynia: Secondary | ICD-10-CM | POA: Diagnosis not present

## 2021-11-09 DIAGNOSIS — J984 Other disorders of lung: Secondary | ICD-10-CM | POA: Diagnosis not present

## 2021-11-09 DIAGNOSIS — I517 Cardiomegaly: Secondary | ICD-10-CM | POA: Diagnosis not present

## 2021-11-10 DIAGNOSIS — K22719 Barrett's esophagus with dysplasia, unspecified: Secondary | ICD-10-CM | POA: Diagnosis not present

## 2021-11-11 ENCOUNTER — Ambulatory Visit: Payer: Medicare HMO | Admitting: Family Medicine

## 2021-11-11 ENCOUNTER — Inpatient Hospital Stay: Payer: Medicare HMO | Admitting: Family Medicine

## 2021-11-11 DIAGNOSIS — Z961 Presence of intraocular lens: Secondary | ICD-10-CM | POA: Diagnosis not present

## 2021-11-11 DIAGNOSIS — Z7985 Long-term (current) use of injectable non-insulin antidiabetic drugs: Secondary | ICD-10-CM | POA: Diagnosis not present

## 2021-11-11 DIAGNOSIS — E113511 Type 2 diabetes mellitus with proliferative diabetic retinopathy with macular edema, right eye: Secondary | ICD-10-CM | POA: Diagnosis not present

## 2021-11-11 DIAGNOSIS — Z794 Long term (current) use of insulin: Secondary | ICD-10-CM | POA: Diagnosis not present

## 2021-11-11 DIAGNOSIS — I5089 Other heart failure: Secondary | ICD-10-CM | POA: Diagnosis not present

## 2021-11-11 DIAGNOSIS — E113592 Type 2 diabetes mellitus with proliferative diabetic retinopathy without macular edema, left eye: Secondary | ICD-10-CM | POA: Diagnosis not present

## 2021-11-11 DIAGNOSIS — H4312 Vitreous hemorrhage, left eye: Secondary | ICD-10-CM | POA: Diagnosis not present

## 2021-11-11 NOTE — Telephone Encounter (Signed)
Received return fax with the note "name unrecognized" in regards to patient who told us this is the facility he is staying at. Attempted to call patient, phone still not working. Contacted his emergency contact who confirmed that [facility] rehab is correct, but she states that she has it heard it called many names. She also states that she has had difficult getting fax's to go through. She has given me the number [phone] to try and reach out to get fax sent to correct place.   [facility] [phone]- spoke with operator and was told that there was not a patient listed with that name.   [place] at [phone] and was also told there is no patient there with that name   Returned call to patients emergency contact and was given several extensions to try to reach someone  Social worker- Chryl Heck ext. [phone]- left message with call back number  Rehab Manager- Bryson Ha ext. [phone]- left message with call back number Case Manager [facility]- ext. [phone]- left message with call back number  DON- [name] ext. [phone]- left message with call back number    RN called emergency contact and updated her that I have left VM with all the above numbers.

## 2021-11-15 NOTE — Telephone Encounter (Signed)
RN attempted to call [name] again and got VM with the following name and number for contact. [name]. Contacted [name] and she confirmed the patient is there in their system and was provided with the following fax number to send his records.   Fax number [phone]

## 2021-11-18 ENCOUNTER — Ambulatory Visit: Payer: Medicare HMO | Admitting: Psychologist

## 2021-11-19 DIAGNOSIS — J984 Other disorders of lung: Secondary | ICD-10-CM | POA: Diagnosis not present

## 2021-11-19 DIAGNOSIS — R0602 Shortness of breath: Secondary | ICD-10-CM | POA: Diagnosis not present

## 2021-11-19 DIAGNOSIS — R7981 Abnormal blood-gas level: Secondary | ICD-10-CM | POA: Diagnosis not present

## 2021-11-19 DIAGNOSIS — R0989 Other specified symptoms and signs involving the circulatory and respiratory systems: Secondary | ICD-10-CM | POA: Diagnosis not present

## 2021-11-19 DIAGNOSIS — J90 Pleural effusion, not elsewhere classified: Secondary | ICD-10-CM | POA: Diagnosis not present

## 2021-11-26 DIAGNOSIS — I5020 Unspecified systolic (congestive) heart failure: Secondary | ICD-10-CM | POA: Diagnosis not present

## 2021-11-26 DIAGNOSIS — T24009A Burn of unspecified degree of unspecified site of unspecified lower limb, except ankle and foot, initial encounter: Secondary | ICD-10-CM | POA: Diagnosis not present

## 2021-11-26 DIAGNOSIS — R5383 Other fatigue: Secondary | ICD-10-CM | POA: Diagnosis not present

## 2021-11-26 DIAGNOSIS — N1832 Chronic kidney disease, stage 3b: Secondary | ICD-10-CM | POA: Diagnosis not present

## 2021-11-26 DIAGNOSIS — I129 Hypertensive chronic kidney disease with stage 1 through stage 4 chronic kidney disease, or unspecified chronic kidney disease: Secondary | ICD-10-CM | POA: Diagnosis not present

## 2021-11-26 DIAGNOSIS — E1122 Type 2 diabetes mellitus with diabetic chronic kidney disease: Secondary | ICD-10-CM | POA: Diagnosis not present

## 2021-11-30 DIAGNOSIS — N185 Chronic kidney disease, stage 5: Secondary | ICD-10-CM | POA: Diagnosis not present

## 2021-11-30 DIAGNOSIS — E119 Type 2 diabetes mellitus without complications: Secondary | ICD-10-CM | POA: Diagnosis not present

## 2021-11-30 DIAGNOSIS — I10 Essential (primary) hypertension: Secondary | ICD-10-CM | POA: Diagnosis not present

## 2021-11-30 DIAGNOSIS — I5022 Chronic systolic (congestive) heart failure: Secondary | ICD-10-CM | POA: Diagnosis not present

## 2021-12-08 ENCOUNTER — Encounter: Payer: Self-pay | Admitting: Vascular Surgery

## 2021-12-08 ENCOUNTER — Ambulatory Visit (INDEPENDENT_AMBULATORY_CARE_PROVIDER_SITE_OTHER): Payer: Medicare HMO | Admitting: Vascular Surgery

## 2021-12-08 ENCOUNTER — Ambulatory Visit: Payer: Medicare HMO | Admitting: Plastic Surgery

## 2021-12-08 VITALS — BP 160/89 | HR 72 | Temp 98.2°F | Resp 96 | Ht 70.0 in | Wt 173.0 lb

## 2021-12-08 DIAGNOSIS — N185 Chronic kidney disease, stage 5: Secondary | ICD-10-CM | POA: Diagnosis not present

## 2021-12-08 NOTE — Progress Notes (Signed)
[facility] Specialist of [place]  Patient name: [name] MRN: [id number] DOB: 14-May-1956 Sex: male  REASON FOR CONSULT: Discuss access for hemodialysis  HPI: [name] is a 66 y.o. male, who is here for discussion of access for hemodialysis.  He has had progressive renal insufficiency and now has indication for dialysis.  He does not have a catheter.  He is right-handed.  He does not have a pacemaker and is not on anticoagulation.  He has multiple medical issues as listed below to include coronary artery disease status post bypass.  Also has esophageal cancer which was treated with nonsurgical means.  His father was a Education officer, environmental at [employer] for many years.  I relayed to the patient we have shared patients over the years prior to his retirement.  Past Medical History:  Diagnosis Date   Anemia    Arthritis    hips shoulders   CAD (coronary artery disease) 03/03/2019   Cancer ([place])    Esophageal cancer 2022   CHF (congestive heart failure) ([place]) 2020   Chronic combined systolic and diastolic heart failure ([place]) 04/26/2018   Admitted 11/14-11/20/19-diuresed 10L   Depression 05/31/2021   Fatigue 10/14/2020   Hypertension    Myocardial infarction [facility])    Sleep apnea    uses a bipap machine   Stroke ([place]) 12/14/2020   no last weakness or paralysis   Type 2 diabetes mellitus with diabetic nephropathy ([place]) 04/23/2019   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    Family History  Problem Relation Age of Onset   Colon cancer Mother    Heart Problems Father    Diabetes Father    Valvular heart disease Father    Sleep apnea Neg Hx    Stroke Neg Hx     SOCIAL HISTORY: Social History   Socioeconomic History   Marital status: Single    Spouse name: Not on file   Number of children: Not on file   Years of education: Not on file   Highest education level: Not on file  Occupational History   Not on file   Tobacco Use   Smoking status: Former    Types: Cigarettes    Quit date: 07/06/1988    Years since quitting: 33.4   Smokeless tobacco: Never  Vaping Use   Vaping Use: Never used  Substance and Sexual Activity   Alcohol use: Not Currently    Alcohol/week: 2.0 standard drinks of alcohol    Types: 2 Glasses of wine per week    Comment: socially   Drug use: No   Sexual activity: Not on file  Other Topics Concern   Not on file  Social History Narrative   Not on file   Social Determinants of Health   Financial Resource Strain: Medium Risk (08/16/2021)   Overall Financial Resource Strain (CARDIA)    Difficulty of Paying Living Expenses: Somewhat hard  Food Insecurity: No Food Insecurity (08/16/2021)   Hunger Vital Sign    Worried About Running Out of Food in the Last Year: Never true    Ran Out of Food in the Last Year: Never true  Transportation Needs: No Transportation Needs (08/16/2021)   PRAPARE - Hydrologist (Medical): No    Lack of Transportation (Non-Medical): No  Physical Activity: Not on file  Stress: Not on file  Social Connections: Not on file  Intimate Partner Violence: Not on file    Allergies  Allergen Reactions   Bee Venom Anaphylaxis   Atorvastatin     myalgia   Diltiazem Itching   Reglan [Metoclopramide] Other (See Comments)    Suicidal    Rosuvastatin     myalgias   Valsartan Itching    Current Outpatient Medications  Medication Sig Dispense Refill   acetaminophen (TYLENOL) 325 MG tablet Take 2 tablets (650 mg total) by mouth every 6 (six) hours as needed for mild pain, moderate pain or fever. 30 tablet 0   albuterol (VENTOLIN HFA) 108 (90 Base) MCG/ACT inhaler Inhale 2 puffs into the lungs every 6 (six) hours as needed. (Patient taking differently: Inhale 2 puffs into the lungs every 6 (six) hours as needed for wheezing or shortness of breath.) 18 g 5   ascorbic acid (VITAMIN C) 500 MG tablet Take 1 tablet (500 mg total) by  mouth daily. 30 tablet 0   carvedilol (COREG) 12.5 MG tablet Take 1 tablet (12.5 mg total) by mouth 2 (two) times daily. 180 tablet 3   Cholecalciferol (VITAMIN D3 PO) Take 1,000 Units by mouth daily.     doxazosin (CARDURA) 4 MG tablet Take 1 tablet (4 mg total) by mouth daily. 90 tablet 1   EPINEPHrine 0.3 mg/0.3 mL IJ SOAJ injection Inject 0.3 mg into the muscle as needed for anaphylaxis. 1 each 4   hydrALAZINE (APRESOLINE) 100 MG tablet Take 1 tablet (100 mg total) by mouth 3 (three) times daily.     hydrOXYzine (VISTARIL) 25 MG capsule TAKE ONE CAPSULE BY MOUTH NIGHTLY AS NEEDED FOR ALLERGIES AND INSOMNIA 30 capsule 1   Insulin Pen Needle (B-D ULTRAFINE III SHORT PEN) 31G X 8 MM MISC 1 each by Does not apply route as directed. 100 each 3   isosorbide mononitrate (IMDUR) 30 MG 24 hr tablet Take 0.5 tablets (15 mg total) by mouth daily. 30 tablet    Multiple Vitamin (MULTIVITAMIN WITH MINERALS) TABS tablet Take 1 tablet by mouth daily.     nitroGLYCERIN (NITROSTAT) 0.4 MG SL tablet Place 1 tablet (0.4 mg total) under the tongue every 5 (five) minutes as needed for chest pain. 25 tablet 2   ondansetron (ZOFRAN-ODT) 4 MG disintegrating tablet TAKE 1 TABLET BY MOUTH EVERY 8 HOURS AS NEEDED FOR NAUSEA AND VOMITING (Patient taking differently: Take 4 mg by mouth every 8 (eight) hours as needed for nausea or vomiting.) 30 tablet 1   pantoprazole (PROTONIX) 40 MG tablet Take 1 tablet (40 mg total) by mouth 2 (two) times daily before a meal. 60 tablet 0   QUEtiapine (SEROQUEL) 25 MG tablet      REPATHA SURECLICK 161 MG/ML SOAJ Inject 140 mg as directed every 14 (fourteen) days. 2 mL 11   Semaglutide (OZEMPIC, 1 MG/DOSE, [place]) Inject 1 mg into the skin once a week.     sertraline (ZOLOFT) 100 MG tablet Take 1 and half tablet  by mouth daily (Patient taking differently: Take 150 mg by mouth daily. Take 1 and half tablet  by mouth daily) 45 tablet 5   sucralfate (CARAFATE) 1 GM/10ML suspension Take 10 mLs (1  g total) by mouth 3 (three) times daily before meals. 420 mL 0   sucralfate (CARAFATE) 1 GM/10ML suspension Take by mouth.     torsemide (DEMADEX) 20 MG tablet Take 40 mg by mouth daily.     Torsemide 40 MG TABS Take 20 mg by mouth daily as needed. As needed for weight gain of 2 pounds overnight or 5 pounds in one  week. 90 tablet 1   traMADol (ULTRAM) 50 MG tablet Take 0.5 tablets (25 mg total) by mouth every 12 (twelve) hours as needed for moderate pain. 10 tablet 0   No current facility-administered medications for this visit.    REVIEW OF SYSTEMS:  '[X]'$  denotes positive finding, '[ ]'$  denotes negative finding Cardiac  Comments:  Chest pain or chest pressure:    Shortness of breath upon exertion:    Short of breath when lying flat:    Irregular heart rhythm:        Vascular    Pain in calf, thigh, or hip brought on by ambulation:    Pain in feet at night that wakes you up from your sleep:     Blood clot in your veins:    Leg swelling:         Pulmonary    Oxygen at home:    Productive cough:     Wheezing:         Neurologic    Sudden weakness in arms or legs:     Sudden numbness in arms or legs:     Sudden onset of difficulty speaking or slurred speech:    Temporary loss of vision in one eye:     Problems with dizziness:         Gastrointestinal    Blood in stool:     Vomited blood:         Genitourinary    Burning when urinating:     Blood in urine:        Psychiatric    Major depression:         Hematologic    Bleeding problems:    Problems with blood clotting too easily:        Skin    Rashes or ulcers:        Constitutional    Fever or chills:      PHYSICAL EXAM: Vitals:   12/08/21 1101  BP: (!) 160/89  Pulse: 72  Resp: (!) 96  Temp: 98.2 F (36.8 C)  TempSrc: Temporal  Weight: 173 lb (78.5 kg)  Height: '5\' 10"'$  (1.778 m)    GENERAL: The patient is a well-nourished male, in no acute distress. The vital signs are documented above. CARDIOVASCULAR:  2+ radial pulses bilaterally.  Moderate cephalic veins in the forearm bilaterally which are superficial and runs straight.  The cephalic vein branches above the wrist on both his right and left arm. PULMONARY: There is good air exchange  MUSCULOSKELETAL: There are no major deformities or cyanosis. NEUROLOGIC: No focal weakness or paresthesias are detected. SKIN: There are no ulcers or rashes noted. PSYCHIATRIC: The patient has a normal affect.  DATA:  None  MEDICAL ISSUES: Had a long discussion with the patient regarding options for hemodialysis.  We have been requested to place a catheter for initiation of hemodialysis and also permanent access.  I did communicate with Dr. Moshe Cipro regarding the urgency of this.  We will schedule his procedure for 12/21/2021 with plans to initiate hemodialysis soon thereafter.  He does appear to be a good candidate for left arm fistula.  I did discuss nonmaturation and potential steal.   Rosetta Posner, MD FACS [facility] Tel [phone] Pager [phone]  Note: Portions of this report may have been transcribed using voice recognition software.  Every effort has been made to ensure accuracy; however, inadvertent computerized transcription errors may still be present.

## 2021-12-08 NOTE — H&P (View-Only) (Signed)
  Vascular and Vein Specialist of [place]  Patient name: [name] MRN: [id number] DOB: 11/20/1955 Sex: male  REASON FOR CONSULT: Discuss access for hemodialysis  HPI: Izael M Wilds is a 66 y.o. male, who is here for discussion of access for hemodialysis.  He has had progressive renal insufficiency and now has indication for dialysis.  He does not have a catheter.  He is right-handed.  He does not have a pacemaker and is not on anticoagulation.  He has multiple medical issues as listed below to include coronary artery disease status post bypass.  Also has esophageal cancer which was treated with nonsurgical means.  His father was a general surgeon at [employer] for many years.  I relayed to the patient we have shared patients over the years prior to his retirement.  Past Medical History:  Diagnosis Date   Anemia    Arthritis    hips shoulders   CAD (coronary artery disease) 03/03/2019   Cancer ([facility])    Esophageal cancer 2022   CHF (congestive heart failure) ([facility]) 2020   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   Admitted 11/14-11/20/19-diuresed 10L   Depression 05/31/2021   Fatigue 10/14/2020   Hypertension    Myocardial infarction ([facility])    Sleep apnea    uses a bipap machine   Stroke ([facility]) 12/14/2020   no last weakness or paralysis   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    Family History  Problem Relation Age of Onset   Colon cancer Mother    Heart Problems Father    Diabetes Father    Valvular heart disease Father    Sleep apnea Neg Hx    Stroke Neg Hx     SOCIAL HISTORY: Social History   Socioeconomic History   Marital status: Single    Spouse name: Not on file   Number of children: Not on file   Years of education: Not on file   Highest education level: Not on file  Occupational History   Not on file   Tobacco Use   Smoking status: Former    Types: Cigarettes    Quit date: 07/06/1988    Years since quitting: 33.4   Smokeless tobacco: Never  Vaping Use   Vaping Use: Never used  Substance and Sexual Activity   Alcohol use: Not Currently    Alcohol/week: 2.0 standard drinks of alcohol    Types: 2 Glasses of wine per week    Comment: socially   Drug use: No   Sexual activity: Not on file  Other Topics Concern   Not on file  Social History Narrative   Not on file   Social Determinants of Health   Financial Resource Strain: Medium Risk (08/16/2021)   Overall Financial Resource Strain (CARDIA)    Difficulty of Paying Living Expenses: Somewhat hard  Food Insecurity: No Food Insecurity (08/16/2021)   Hunger Vital Sign    Worried About Running Out of Food in the Last Year: Never true    Ran Out of Food in the Last Year: Never true  Transportation Needs: No Transportation Needs (08/16/2021)   PRAPARE - Transportation    Lack of Transportation (Medical): No    Lack of Transportation (Non-Medical): No  Physical Activity: Not on file  Stress: Not on file  Social Connections: Not on file  Intimate Partner Violence: Not on file    Allergies    Allergen Reactions   Bee Venom Anaphylaxis   Atorvastatin     myalgia   Diltiazem Itching   Reglan [Metoclopramide] Other (See Comments)    Suicidal    Rosuvastatin     myalgias   Valsartan Itching    Current Outpatient Medications  Medication Sig Dispense Refill   acetaminophen (TYLENOL) 325 MG tablet Take 2 tablets (650 mg total) by mouth every 6 (six) hours as needed for mild pain, moderate pain or fever. 30 tablet 0   albuterol (VENTOLIN HFA) 108 (90 Base) MCG/ACT inhaler Inhale 2 puffs into the lungs every 6 (six) hours as needed. (Patient taking differently: Inhale 2 puffs into the lungs every 6 (six) hours as needed for wheezing or shortness of breath.) 18 g 5   ascorbic acid (VITAMIN C) 500 MG tablet Take 1 tablet (500 mg total) by  mouth daily. 30 tablet 0   carvedilol (COREG) 12.5 MG tablet Take 1 tablet (12.5 mg total) by mouth 2 (two) times daily. 180 tablet 3   Cholecalciferol (VITAMIN D3 PO) Take 1,000 Units by mouth daily.     doxazosin (CARDURA) 4 MG tablet Take 1 tablet (4 mg total) by mouth daily. 90 tablet 1   EPINEPHrine 0.3 mg/0.3 mL IJ SOAJ injection Inject 0.3 mg into the muscle as needed for anaphylaxis. 1 each 4   hydrALAZINE (APRESOLINE) 100 MG tablet Take 1 tablet (100 mg total) by mouth 3 (three) times daily.     hydrOXYzine (VISTARIL) 25 MG capsule TAKE ONE CAPSULE BY MOUTH NIGHTLY AS NEEDED FOR ALLERGIES AND INSOMNIA 30 capsule 1   Insulin Pen Needle (B-D ULTRAFINE III SHORT PEN) 31G X 8 MM MISC 1 each by Does not apply route as directed. 100 each 3   isosorbide mononitrate (IMDUR) 30 MG 24 hr tablet Take 0.5 tablets (15 mg total) by mouth daily. 30 tablet    Multiple Vitamin (MULTIVITAMIN WITH MINERALS) TABS tablet Take 1 tablet by mouth daily.     nitroGLYCERIN (NITROSTAT) 0.4 MG SL tablet Place 1 tablet (0.4 mg total) under the tongue every 5 (five) minutes as needed for chest pain. 25 tablet 2   ondansetron (ZOFRAN-ODT) 4 MG disintegrating tablet TAKE 1 TABLET BY MOUTH EVERY 8 HOURS AS NEEDED FOR NAUSEA AND VOMITING (Patient taking differently: Take 4 mg by mouth every 8 (eight) hours as needed for nausea or vomiting.) 30 tablet 1   pantoprazole (PROTONIX) 40 MG tablet Take 1 tablet (40 mg total) by mouth 2 (two) times daily before a meal. 60 tablet 0   QUEtiapine (SEROQUEL) 25 MG tablet      REPATHA SURECLICK 140 MG/ML SOAJ Inject 140 mg as directed every 14 (fourteen) days. 2 mL 11   Semaglutide (OZEMPIC, 1 MG/DOSE, [place]) Inject 1 mg into the skin once a week.     sertraline (ZOLOFT) 100 MG tablet Take 1 and half tablet  by mouth daily (Patient taking differently: Take 150 mg by mouth daily. Take 1 and half tablet  by mouth daily) 45 tablet 5   sucralfate (CARAFATE) 1 GM/10ML suspension Take 10 mLs (1  g total) by mouth 3 (three) times daily before meals. 420 mL 0   sucralfate (CARAFATE) 1 GM/10ML suspension Take by mouth.     torsemide (DEMADEX) 20 MG tablet Take 40 mg by mouth daily.     Torsemide 40 MG TABS Take 20 mg by mouth daily as needed. As needed for weight gain of 2 pounds overnight or 5 pounds in one   week. 90 tablet 1   traMADol (ULTRAM) 50 MG tablet Take 0.5 tablets (25 mg total) by mouth every 12 (twelve) hours as needed for moderate pain. 10 tablet 0   No current facility-administered medications for this visit.    REVIEW OF SYSTEMS:  [X] denotes positive finding, [ ] denotes negative finding Cardiac  Comments:  Chest pain or chest pressure:    Shortness of breath upon exertion:    Short of breath when lying flat:    Irregular heart rhythm:        Vascular    Pain in calf, thigh, or hip brought on by ambulation:    Pain in feet at night that wakes you up from your sleep:     Blood clot in your veins:    Leg swelling:         Pulmonary    Oxygen at home:    Productive cough:     Wheezing:         Neurologic    Sudden weakness in arms or legs:     Sudden numbness in arms or legs:     Sudden onset of difficulty speaking or slurred speech:    Temporary loss of vision in one eye:     Problems with dizziness:         Gastrointestinal    Blood in stool:     Vomited blood:         Genitourinary    Burning when urinating:     Blood in urine:        Psychiatric    Major depression:         Hematologic    Bleeding problems:    Problems with blood clotting too easily:        Skin    Rashes or ulcers:        Constitutional    Fever or chills:      PHYSICAL EXAM: Vitals:   12/08/21 1101  BP: (!) 160/89  Pulse: 72  Resp: (!) 96  Temp: 98.2 F (36.8 C)  TempSrc: Temporal  Weight: 173 lb (78.5 kg)  Height: 5' 10" (1.778 m)    GENERAL: The patient is a well-nourished male, in no acute distress. The vital signs are documented above. CARDIOVASCULAR:  2+ radial pulses bilaterally.  Moderate cephalic veins in the forearm bilaterally which are superficial and runs straight.  The cephalic vein branches above the wrist on both his right and left arm. PULMONARY: There is good air exchange  MUSCULOSKELETAL: There are no major deformities or cyanosis. NEUROLOGIC: No focal weakness or paresthesias are detected. SKIN: There are no ulcers or rashes noted. PSYCHIATRIC: The patient has a normal affect.  DATA:  None  MEDICAL ISSUES: Had a long discussion with the patient regarding options for hemodialysis.  We have been requested to place a catheter for initiation of hemodialysis and also permanent access.  I did communicate with Dr. Goldsborough regarding the urgency of this.  We will schedule his procedure for 12/21/2021 with plans to initiate hemodialysis soon thereafter.  He does appear to be a good candidate for left arm fistula.  I did discuss nonmaturation and potential steal.   Daelyn Mozer F. Janiah Devinney, MD FACS [facility] [place] Office Tel [phone] Pager [phone]  Note: Portions of this report may have been transcribed using voice recognition software.  Every effort has been made to ensure accuracy; however, inadvertent computerized transcription errors may still be present.  

## 2021-12-09 ENCOUNTER — Ambulatory Visit: Payer: Medicare HMO | Admitting: Plastic Surgery

## 2021-12-15 ENCOUNTER — Other Ambulatory Visit: Payer: Self-pay

## 2021-12-15 DIAGNOSIS — N185 Chronic kidney disease, stage 5: Secondary | ICD-10-CM

## 2021-12-17 ENCOUNTER — Encounter (HOSPITAL_COMMUNITY)
Admission: RE | Admit: 2021-12-17 | Discharge: 2021-12-17 | Disposition: A | Discharge status: Home / Self Care | Payer: Medicare HMO | Source: Ambulatory Visit | Attending: Vascular Surgery | Admitting: Vascular Surgery

## 2021-12-20 DIAGNOSIS — K2270 Barrett's esophagus without dysplasia: Secondary | ICD-10-CM | POA: Diagnosis not present

## 2021-12-20 DIAGNOSIS — K22719 Barrett's esophagus with dysplasia, unspecified: Secondary | ICD-10-CM | POA: Diagnosis not present

## 2021-12-20 DIAGNOSIS — K3189 Other diseases of stomach and duodenum: Secondary | ICD-10-CM | POA: Diagnosis not present

## 2021-12-20 DIAGNOSIS — K449 Diaphragmatic hernia without obstruction or gangrene: Secondary | ICD-10-CM | POA: Diagnosis not present

## 2021-12-20 DIAGNOSIS — C159 Malignant neoplasm of esophagus, unspecified: Secondary | ICD-10-CM | POA: Diagnosis not present

## 2021-12-20 DIAGNOSIS — I110 Hypertensive heart disease with heart failure: Secondary | ICD-10-CM | POA: Diagnosis not present

## 2021-12-20 DIAGNOSIS — K2950 Unspecified chronic gastritis without bleeding: Secondary | ICD-10-CM | POA: Diagnosis not present

## 2021-12-20 DIAGNOSIS — I5042 Chronic combined systolic (congestive) and diastolic (congestive) heart failure: Secondary | ICD-10-CM | POA: Diagnosis not present

## 2021-12-20 DIAGNOSIS — E1122 Type 2 diabetes mellitus with diabetic chronic kidney disease: Secondary | ICD-10-CM | POA: Diagnosis not present

## 2021-12-20 DIAGNOSIS — I2510 Atherosclerotic heart disease of native coronary artery without angina pectoris: Secondary | ICD-10-CM | POA: Diagnosis not present

## 2021-12-20 DIAGNOSIS — N183 Chronic kidney disease, stage 3 unspecified: Secondary | ICD-10-CM | POA: Diagnosis not present

## 2021-12-20 DIAGNOSIS — E1151 Type 2 diabetes mellitus with diabetic peripheral angiopathy without gangrene: Secondary | ICD-10-CM | POA: Diagnosis not present

## 2021-12-21 ENCOUNTER — Other Ambulatory Visit: Payer: Self-pay

## 2021-12-21 ENCOUNTER — Encounter (HOSPITAL_COMMUNITY)
Admission: RE | Disposition: A | Discharge status: Home / Self Care | Payer: Self-pay | Source: Home / Self Care | Attending: Vascular Surgery

## 2021-12-21 ENCOUNTER — Encounter (HOSPITAL_COMMUNITY): Payer: Self-pay | Admitting: Vascular Surgery

## 2021-12-21 ENCOUNTER — Ambulatory Visit (HOSPITAL_COMMUNITY): Payer: Medicare HMO | Admitting: Certified Registered Nurse Anesthetist

## 2021-12-21 ENCOUNTER — Ambulatory Visit (HOSPITAL_COMMUNITY)
Admission: RE | Admit: 2021-12-21 | Discharge: 2021-12-21 | Disposition: A | Discharge status: Home / Self Care | Payer: Medicare HMO | Attending: Vascular Surgery | Admitting: Vascular Surgery

## 2021-12-21 ENCOUNTER — Ambulatory Visit (HOSPITAL_BASED_OUTPATIENT_CLINIC_OR_DEPARTMENT_OTHER): Payer: Medicare HMO | Admitting: Certified Registered Nurse Anesthetist

## 2021-12-21 ENCOUNTER — Ambulatory Visit (HOSPITAL_COMMUNITY): Payer: Medicare HMO

## 2021-12-21 DIAGNOSIS — M1990 Unspecified osteoarthritis, unspecified site: Secondary | ICD-10-CM | POA: Diagnosis not present

## 2021-12-21 DIAGNOSIS — I252 Old myocardial infarction: Secondary | ICD-10-CM

## 2021-12-21 DIAGNOSIS — Z87891 Personal history of nicotine dependence: Secondary | ICD-10-CM | POA: Insufficient documentation

## 2021-12-21 DIAGNOSIS — Z9889 Other specified postprocedural states: Secondary | ICD-10-CM | POA: Diagnosis not present

## 2021-12-21 DIAGNOSIS — N186 End stage renal disease: Secondary | ICD-10-CM | POA: Diagnosis not present

## 2021-12-21 DIAGNOSIS — Z992 Dependence on renal dialysis: Secondary | ICD-10-CM

## 2021-12-21 DIAGNOSIS — I132 Hypertensive heart and chronic kidney disease with heart failure and with stage 5 chronic kidney disease, or end stage renal disease: Secondary | ICD-10-CM | POA: Insufficient documentation

## 2021-12-21 DIAGNOSIS — Z8673 Personal history of transient ischemic attack (TIA), and cerebral infarction without residual deficits: Secondary | ICD-10-CM | POA: Insufficient documentation

## 2021-12-21 DIAGNOSIS — F419 Anxiety disorder, unspecified: Secondary | ICD-10-CM | POA: Diagnosis not present

## 2021-12-21 DIAGNOSIS — K219 Gastro-esophageal reflux disease without esophagitis: Secondary | ICD-10-CM | POA: Diagnosis not present

## 2021-12-21 DIAGNOSIS — Z7985 Long-term (current) use of injectable non-insulin antidiabetic drugs: Secondary | ICD-10-CM | POA: Diagnosis not present

## 2021-12-21 DIAGNOSIS — G4730 Sleep apnea, unspecified: Secondary | ICD-10-CM | POA: Insufficient documentation

## 2021-12-21 DIAGNOSIS — I2510 Atherosclerotic heart disease of native coronary artery without angina pectoris: Secondary | ICD-10-CM | POA: Diagnosis not present

## 2021-12-21 DIAGNOSIS — N185 Chronic kidney disease, stage 5: Secondary | ICD-10-CM

## 2021-12-21 DIAGNOSIS — J449 Chronic obstructive pulmonary disease, unspecified: Secondary | ICD-10-CM | POA: Insufficient documentation

## 2021-12-21 DIAGNOSIS — I517 Cardiomegaly: Secondary | ICD-10-CM | POA: Diagnosis not present

## 2021-12-21 DIAGNOSIS — I5042 Chronic combined systolic (congestive) and diastolic (congestive) heart failure: Secondary | ICD-10-CM

## 2021-12-21 DIAGNOSIS — E1122 Type 2 diabetes mellitus with diabetic chronic kidney disease: Secondary | ICD-10-CM | POA: Diagnosis not present

## 2021-12-21 DIAGNOSIS — Z452 Encounter for adjustment and management of vascular access device: Secondary | ICD-10-CM | POA: Diagnosis not present

## 2021-12-21 DIAGNOSIS — F32A Depression, unspecified: Secondary | ICD-10-CM | POA: Insufficient documentation

## 2021-12-21 DIAGNOSIS — Z951 Presence of aortocoronary bypass graft: Secondary | ICD-10-CM | POA: Diagnosis not present

## 2021-12-21 HISTORY — PX: INSERTION OF DIALYSIS CATHETER: SHX1324

## 2021-12-21 HISTORY — PX: AV FISTULA PLACEMENT: SHX1204

## 2021-12-21 LAB — POCT I-STAT, CHEM 8
BUN: 62 mg/dL — ABNORMAL HIGH (ref 8–23)
Calcium, Ion: 1.25 mmol/L (ref 1.15–1.40)
Chloride: 106 mmol/L (ref 98–111)
Creatinine, Ser: 5.3 mg/dL — ABNORMAL HIGH (ref 0.61–1.24)
Glucose, Bld: 166 mg/dL — ABNORMAL HIGH (ref 70–99)
HCT: 29 % — ABNORMAL LOW (ref 39.0–52.0)
Hemoglobin: 9.9 g/dL — ABNORMAL LOW (ref 13.0–17.0)
Potassium: 3.8 mmol/L (ref 3.5–5.1)
Sodium: 138 mmol/L (ref 135–145)
TCO2: 19 mmol/L — ABNORMAL LOW (ref 22–32)

## 2021-12-21 SURGERY — INSERTION OF ARTERIOVENOUS (AV) GORE-TEX GRAFT ARM
Anesthesia: Monitor Anesthesia Care | Site: Neck | Laterality: Right

## 2021-12-21 MED ORDER — PROPOFOL 10 MG/ML IV BOLUS
INTRAVENOUS | Status: DC | PRN
  Administered 2021-12-21 (×2): 20 mg via INTRAVENOUS

## 2021-12-21 MED ORDER — FENTANYL CITRATE (PF) 100 MCG/2ML IJ SOLN
INTRAMUSCULAR | Status: AC
  Filled 2021-12-21: qty 2

## 2021-12-21 MED ORDER — LIDOCAINE-EPINEPHRINE 0.5 %-1:200000 IJ SOLN
INTRAMUSCULAR | Status: DC | PRN
  Administered 2021-12-21: 18 mL

## 2021-12-21 MED ORDER — FENTANYL CITRATE (PF) 100 MCG/2ML IJ SOLN
INTRAMUSCULAR | Status: DC | PRN
  Administered 2021-12-21 (×2): 50 ug via INTRAVENOUS

## 2021-12-21 MED ORDER — ORAL CARE MOUTH RINSE: 15.0000 mL | Freq: Once | OROMUCOSAL | Status: DC

## 2021-12-21 MED ORDER — CHLORHEXIDINE GLUCONATE 4 % EX LIQD: 60.0000 mL | Freq: Once | CUTANEOUS | Status: DC

## 2021-12-21 MED ORDER — SODIUM CHLORIDE 0.9 % IV SOLN: INTRAVENOUS | Status: DC

## 2021-12-21 MED ORDER — HEPARIN SODIUM (PORCINE) 1000 UNIT/ML IJ SOLN
INTRAMUSCULAR | Status: DC | PRN
  Administered 2021-12-21: 3800 [IU] via INTRAVENOUS

## 2021-12-21 MED ORDER — CHLORHEXIDINE GLUCONATE 0.12 % MT SOLN: 15.0000 mL | Freq: Once | OROMUCOSAL | Status: DC

## 2021-12-21 MED ORDER — HEPARIN SODIUM (PORCINE) 1000 UNIT/ML IJ SOLN
INTRAMUSCULAR | Status: AC
  Filled 2021-12-21: qty 4

## 2021-12-21 MED ORDER — CEFAZOLIN SODIUM-DEXTROSE 2-4 GM/100ML-% IV SOLN
INTRAVENOUS | Status: AC
  Filled 2021-12-21: qty 100

## 2021-12-21 MED ORDER — HYDROCODONE-ACETAMINOPHEN 7.5-325 MG PO TABS: 1.0000 | ORAL_TABLET | Freq: Once | ORAL | Status: DC | PRN

## 2021-12-21 MED ORDER — HEPARIN 6000 UNIT IRRIGATION SOLUTION
Status: DC | PRN
  Administered 2021-12-21: 1

## 2021-12-21 MED ORDER — LIDOCAINE-EPINEPHRINE 0.5 %-1:200000 IJ SOLN
INTRAMUSCULAR | Status: AC
  Filled 2021-12-21: qty 1

## 2021-12-21 MED ORDER — HEPARIN SODIUM (PORCINE) 1000 UNIT/ML IJ SOLN
INTRAMUSCULAR | Status: AC
  Filled 2021-12-21: qty 6

## 2021-12-21 MED ORDER — 0.9 % SODIUM CHLORIDE (POUR BTL) OPTIME
TOPICAL | Status: DC | PRN
  Administered 2021-12-21: 500 mL

## 2021-12-21 MED ORDER — FENTANYL CITRATE PF 50 MCG/ML IJ SOSY: 25.0000 ug | PREFILLED_SYRINGE | INTRAMUSCULAR | Status: DC | PRN

## 2021-12-21 MED ORDER — ONDANSETRON HCL 4 MG/2ML IJ SOLN: 4.0000 mg | Freq: Once | INTRAMUSCULAR | Status: DC | PRN

## 2021-12-21 MED ORDER — SEVOFLURANE IN SOLN
RESPIRATORY_TRACT | Status: AC
  Filled 2021-12-21: qty 250

## 2021-12-21 MED ORDER — OXYCODONE-ACETAMINOPHEN 5-325 MG PO TABS: 1.0000 | ORAL_TABLET | Freq: Four times a day (QID) | ORAL | 0 refills | Status: DC | PRN

## 2021-12-21 MED ORDER — CEFAZOLIN SODIUM-DEXTROSE 2-4 GM/100ML-% IV SOLN
2.0000 g | INTRAVENOUS | Status: AC
  Administered 2021-12-21: 2 g via INTRAVENOUS

## 2021-12-21 MED ORDER — PROPOFOL 500 MG/50ML IV EMUL
INTRAVENOUS | Status: DC | PRN
  Administered 2021-12-21: 100 ug/kg/min via INTRAVENOUS

## 2021-12-21 SURGICAL SUPPLY — 63 items
ARMBAND PINK RESTRICT EXTREMIT (MISCELLANEOUS) ×3 IMPLANT
BAG HAMPER (MISCELLANEOUS) ×3 IMPLANT
BIOPATCH RED 1 DISK 7.0 (GAUZE/BANDAGES/DRESSINGS) ×3 IMPLANT
CANNULA VESSEL 3MM 2 BLNT TIP (CANNULA) ×3 IMPLANT
CATH PALINDROME-P 23CM W/VT (CATHETERS) ×1 IMPLANT
CLIP LIGATING EXTRA MED SLVR (CLIP) ×3 IMPLANT
CLIP LIGATING EXTRA SM BLUE (MISCELLANEOUS) ×3 IMPLANT
COVER LIGHT HANDLE STERIS (MISCELLANEOUS) ×6 IMPLANT
COVER MAYO STAND XLG (MISCELLANEOUS) ×3 IMPLANT
COVER PROBE U/S 5X48 (MISCELLANEOUS) ×3 IMPLANT
COVER SURGICAL LIGHT HANDLE (MISCELLANEOUS) ×4 IMPLANT
DERMABOND ADVANCED (GAUZE/BANDAGES/DRESSINGS) ×2
DERMABOND ADVANCED .7 DNX12 (GAUZE/BANDAGES/DRESSINGS) ×2 IMPLANT
DRAPE C-ARM FOLDED MOBILE STRL (DRAPES) ×3 IMPLANT
DRAPE CHEST BREAST 15X10 FENES (DRAPES) ×3 IMPLANT
ELECT REM PT RETURN 9FT ADLT (ELECTROSURGICAL) ×3
ELECTRODE REM PT RTRN 9FT ADLT (ELECTROSURGICAL) ×2 IMPLANT
GAUZE SPONGE 4X4 12PLY STRL (GAUZE/BANDAGES/DRESSINGS) ×5 IMPLANT
GEL ULTRASOUND 20GR AQUASONIC (MISCELLANEOUS) ×3 IMPLANT
GLOVE BIO SURGEON STRL SZ7 (GLOVE) ×1 IMPLANT
GLOVE BIOGEL PI IND STRL 6.5 (GLOVE) IMPLANT
GLOVE BIOGEL PI IND STRL 7.0 (GLOVE) ×4 IMPLANT
GLOVE BIOGEL PI IND STRL 7.5 (GLOVE) IMPLANT
GLOVE BIOGEL PI INDICATOR 6.5 (GLOVE) ×2
GLOVE BIOGEL PI INDICATOR 7.0 (GLOVE) ×4
GLOVE BIOGEL PI INDICATOR 7.5 (GLOVE) ×1
GLOVE SS BIOGEL STRL SZ 7.5 (GLOVE) IMPLANT
GLOVE SUPERSENSE BIOGEL SZ 7.5 (GLOVE) ×2
GLOVE SURG MICRO LTX SZ7.5 (GLOVE) ×3 IMPLANT
GOWN STRL REUS W/TWL LRG LVL3 (GOWN DISPOSABLE) ×12 IMPLANT
IV NS 500ML (IV SOLUTION) ×1
IV NS 500ML BAXH (IV SOLUTION) ×4 IMPLANT
KIT BLADEGUARD II DBL (SET/KITS/TRAYS/PACK) ×3 IMPLANT
KIT PALINDROME-P 55CM (CATHETERS) IMPLANT
KIT TURNOVER KIT A (KITS) ×3 IMPLANT
MANIFOLD NEPTUNE II (INSTRUMENTS) ×3 IMPLANT
MARKER SKIN DUAL TIP RULER LAB (MISCELLANEOUS) ×6 IMPLANT
NDL 18GX1X1/2 (RX/OR ONLY) (NEEDLE) ×2 IMPLANT
NDL HYPO 18GX1.5 BLUNT FILL (NEEDLE) ×2 IMPLANT
NDL HYPO 25GX1X1/2 BEV (NEEDLE) ×2 IMPLANT
NEEDLE 18GX1X1/2 (RX/OR ONLY) (NEEDLE) ×3 IMPLANT
NEEDLE 22X1 1/2 (OR ONLY) (NEEDLE) IMPLANT
NEEDLE HYPO 18GX1.5 BLUNT FILL (NEEDLE) ×3 IMPLANT
NEEDLE HYPO 25GX1X1/2 BEV (NEEDLE) ×3 IMPLANT
NS IRRIG 500ML POUR BTL (IV SOLUTION) ×1 IMPLANT
PACK CV ACCESS (CUSTOM PROCEDURE TRAY) ×3 IMPLANT
PACK SURGICAL SETUP 50X90 (CUSTOM PROCEDURE TRAY) ×3 IMPLANT
PAD ARMBOARD 7.5X6 YLW CONV (MISCELLANEOUS) ×6 IMPLANT
SET BASIN LINEN APH (SET/KITS/TRAYS/PACK) ×3 IMPLANT
SOAP 2 % CHG 4 OZ (WOUND CARE) ×3 IMPLANT
SOL PREP POV-IOD 4OZ 10% (MISCELLANEOUS) ×3 IMPLANT
SOL PREP PROV IODINE SCRUB 4OZ (MISCELLANEOUS) ×3 IMPLANT
SPONGE T-LAP 18X18 ~~LOC~~+RFID (SPONGE) ×3 IMPLANT
SUT ETHILON 3 0 PS 1 (SUTURE) ×3 IMPLANT
SUT PROLENE 6 0 CC (SUTURE) ×3 IMPLANT
SUT SILK 2 0 FSL 18 (SUTURE) ×2 IMPLANT
SUT VIC AB 3-0 SH 27 (SUTURE) ×1
SUT VIC AB 3-0 SH 27X BRD (SUTURE) ×2 IMPLANT
SYR 10ML LL (SYRINGE) ×3 IMPLANT
SYR 5ML LL (SYRINGE) ×6 IMPLANT
SYR CONTROL 10ML LL (SYRINGE) ×3 IMPLANT
UNDERPAD 30X36 HEAVY ABSORB (UNDERPADS AND DIAPERS) ×3 IMPLANT
WATER STERILE IRR 500ML POUR (IV SOLUTION) ×1 IMPLANT

## 2021-12-21 NOTE — Interval H&P Note (Signed)
History and Physical Interval Note:  12/21/2021 8:52 AM  [name]  has presented today for surgery, with the diagnosis of Chronic Kidney Disease 4.  The various methods of treatment have been discussed with the patient and family. After consideration of risks, benefits and other options for treatment, the patient has consented to  Procedure(s): INSERTION OF LEFT ARM ARTERIOVENOUS (AV) GORE-TEX GRAFT VERSUS ARTERIOVENOUS FISTULA (Left) INSERTION OF TUNNELED DIALYSIS CATHETER (N/A) as a surgical intervention.  The patient's history has been reviewed, patient examined, no change in status, stable for surgery.  I have reviewed the patient's chart and labs.  Questions were answered to the patient's satisfaction.     Curt Jews

## 2021-12-21 NOTE — Discharge Instructions (Signed)
[facility]  Discharge Instructions  AV Fistula or Graft Surgery for Dialysis Access  Please refer to the following instructions for your post-procedure care. Your surgeon or physician assistant will discuss any changes with you.  Activity  You may drive the day following your surgery, if you are comfortable and no longer taking prescription pain medication. Resume full activity as the soreness in your incision resolves.  Bathing/Showering  You may shower after you go home. Keep your incision dry for 48 hours. Do not soak in a bathtub, hot tub, or swim until the incision heals completely. You may not shower if you have a hemodialysis catheter.  Incision Care  Clean your incision with mild soap and water after 48 hours. Pat the area dry with a clean towel. You do not need a bandage unless otherwise instructed. Do not apply any ointments or creams to your incision. You may have skin glue on your incision. Do not peel it off. It will come off on its own in about one week. Your arm may swell a bit after surgery. To reduce swelling use pillows to elevate your arm so it is above your heart. Your doctor will tell you if you need to lightly wrap your arm with an ACE bandage.  Diet  Resume your normal diet. There are not special food restrictions following this procedure. In order to heal from your surgery, it is CRITICAL to get adequate nutrition. Your body requires vitamins, minerals, and protein. Vegetables are the best source of vitamins and minerals. Vegetables also provide the perfect balance of protein. Processed food has little nutritional value, so try to avoid this.  Medications  Resume taking all of your medications. If your incision is causing pain, you may take over-the counter pain relievers such as acetaminophen (Tylenol). If you were prescribed a stronger pain medication, please be aware these medications can cause nausea and constipation. Prevent  nausea by taking the medication with a snack or meal. Avoid constipation by drinking plenty of fluids and eating foods with high amount of fiber, such as fruits, vegetables, and grains.  Do not take Tylenol if you are taking prescription pain medications.  Follow up Your surgeon may want to see you in the office following your access surgery. If so, this will be arranged at the time of your surgery.  Please call us immediately for any of the following conditions:  Increased pain, redness, drainage (pus) from your incision site Fever of 101 degrees or higher Severe or worsening pain at your incision site Hand pain or numbness.  Reduce your risk of vascular disease:  Stop smoking. If you would like help, call [facility] at 1-800-QUIT-NOW 720-877-0928) or [place] at [place] your cholesterol Maintain a desired weight Control your diabetes Keep your blood pressure down  Dialysis  It will take several weeks to several months for your new dialysis access to be ready for use. Your surgeon will determine when it is okay to use it. Your nephrologist will continue to direct your dialysis. You can continue to use your Permcath until your new access is ready for use.   12/21/2021 [name] [id number] 1955-07-16  Surgeon(s): Aziza Stuckert, Arvilla Meres, MD  Procedure(s): LEFT ARM ARTERIOVENOUS (AV) FISTULA CREATION INSERTION OF TUNNELED DIALYSIS CATHETER   May stick graft immediately   May stick graft on designated area only:    Do not stick fistula for 12 weeks    If you have any questions, please  call the office at [phone].

## 2021-12-21 NOTE — Anesthesia Preprocedure Evaluation (Addendum)
Anesthesia Evaluation  Patient identified by MRN, date of birth, ID band Patient awake    Reviewed: Allergy & Precautions, NPO status , Patient's Chart, lab work & pertinent test results  History of Anesthesia Complications Negative for: history of anesthetic complications  Airway Mallampati: II  TM Distance: >3 FB Neck ROM: Full    Dental  (+) Missing, Dental Advisory Given, Poor Dentition   Pulmonary sleep apnea , COPD,  COPD inhaler, former smoker,    breath sounds clear to auscultation       Cardiovascular Exercise Tolerance: Good hypertension, Pt. on medications (-) angina+ CAD, + Past MI, + CABG, + Peripheral Vascular Disease and +CHF (hx)   Rhythm:Regular Rate:Normal  07/2021 ECHO: EF 40 to 45%. The LV has mildly decreased function. mild concentric LVH, akinesis of the left ventricular, basal inferior wall and inferoseptal wall.  RVF is moderately reduced. The right ventricular size is severely enlarged. There is moderately elevated  pulmonary artery systolic pressure, mild-mod MR  02/2021 Stress: Moderate size and intensity, mostly fixed basal to mid inferior and inferolateral perfusion defect (SDS 1), suggestive of scar with minimal peri-infarct ischemia. LVEF 28% with global hypokinesis and basal to mid inferior akinesis. This is a high risk study   Neuro/Psych Anxiety Depression CVA, No Residual Symptoms    GI/Hepatic Neg liver ROS, GERD  Medicated and Controlled,  Endo/Other  negative endocrine ROSdiabetes, Insulin Dependent, Oral Hypoglycemic Agents  Renal/GU Renal Insufficiency, ESRF and DialysisRenal disease     Musculoskeletal  (+) Arthritis ,   Abdominal   Peds  Hematology  (+) Blood dyscrasia, anemia ,   Anesthesia Other Findings   Reproductive/Obstetrics                            Anesthesia Physical  Anesthesia Plan  ASA: 4  Anesthesia Plan: MAC   Post-op Pain  Management: Minimal or no pain anticipated   Induction:   PONV Risk Score and Plan: 1 and Treatment may vary due to age or medical condition  Airway Management Planned: Natural Airway and Nasal Cannula  Additional Equipment: None  Intra-op Plan:   Post-operative Plan:   Informed Consent: I have reviewed the patients History and Physical, chart, labs and discussed the procedure including the risks, benefits and alternatives for the proposed anesthesia with the patient or authorized representative who has indicated his/her understanding and acceptance.       Plan Discussed with: CRNA  Anesthesia Plan Comments:         Anesthesia Quick Evaluation

## 2021-12-21 NOTE — Op Note (Signed)
OPERATIVE REPORT  DATE OF SURGERY: 12/21/2021  PATIENT: BOOKERT GUZZI, 66 y.o. male MRN: [id number]  DOB: 06/12/1956  PRE-OPERATIVE DIAGNOSIS: End-stage renal disease  POST-OPERATIVE DIAGNOSIS:  Same  PROCEDURE: #1 right IJ tunneled hemodialysis catheter, #2 left brachiocephalic AV fistula creation  SURGEON:  Curt Jews, M.D.  PHYSICIAN ASSISTANT: Vevelyn Royals, RNFA  The assistant was needed for exposure and to expedite the case  ANESTHESIA: Local with sedation  EBL: per anesthesia record  Total I/O In: 200 [I.V.:200] Out: -   BLOOD ADMINISTERED: none  DRAINS: none  SPECIMEN: none  COUNTS CORRECT:  YES  PATIENT DISPOSITION:  PACU - hemodynamically stable  PROCEDURE DETAILS: The patient was taken operating placed supine position and the area of the right and left neck and chest prepped and draped in usual sterile fashion.  Using SonoSite ultrasound the right internal jugular vein was identified.  Using local anesthesia followed by an 18-gauge needle the vein was accessed and a guidewire was passed centrally.  Fluoroscopy was used to confirm this.  A separate incision was made at the level of the clavicle and a 23 cm 2 lm tunneled hemodialysis catheter was brought through the tunnel.  The dilator was then passed over the guidewire and then the guide dilator and peel-away sheath was passed over the guidewire.  The dilator and guidewire were removed and the catheter was passed down the peel-away sheath which was removed.  The catheter tip was positioned in the level of the distal right atrium.  Both lumens flushed and aspirated easily and were locked with 1000 unit/cc heparin.  The catheter was secured to the skin with a 3-0 nylon stitch and the entry site was closed with a 4-0 subcuticular Vicryl stitch.  Attention was then turned to the left arm.  The patient had a moderate size cephalic vein at the level of the wrist.  The left arm was prepped and draped in usual sterile  fashion.  Incision was made made between the level of the cephalic vein and the radial artery at the wrist.  The cephalic vein was mobilized proximally distally and tributary branches were occluded with 3-0 silk ties and divided.  The vein was ligated distally and divided.  The vein was gently dilated.  The vein was of good caliber to just above the wrist and then became very sclerotic.  For this reason I decided to abandon use of the cephalic vein at the wrist.  Patient did have a large caliber cephalic vein at the antecubital space.  Using local anesthesia, a separate incision was made at the antecubital space and carried down the cephalic vein which was a very good caliber and the brachial artery which was also of good caliber.  The vein was mobilized and was ligated distally and divided and was brought into approximation with the brachial artery.  The brachial artery was occluded proximally and distally and was opened with an 11 blade and sent lossing Pott scissors.  The vein was slightly spatulated and sewn end-to-side to the artery with a running 6-0 Prolene suture.  Clamps were removed and excellent thrill was noted.  The patient maintained a left radial pulse.  The wounds were irrigated with saline.  Hemostasis obtained after cautery.  The wounds were closed with 3-0 Vicryl in the subcutaneous and subcuticular tissue.  Sterile dressing was applied and the patient was transferred to the recovery room in stable condition with chest x-ray pending   Rosetta Posner, M.D., [facility] 12/21/2021  12:26 PM  Note: Portions of this report may have been transcribed using voice recognition software.  Every effort has been made to ensure accuracy; however, inadvertent computerized transcription errors may still be present.

## 2021-12-21 NOTE — Transfer of Care (Signed)
Immediate Anesthesia Transfer of Care Note  Patient: [name]  Procedure(s) Performed: LEFT ARM ARTERIOVENOUS (AV) FISTULA CREATION (Left: Arm Lower) INSERTION OF TUNNELED DIALYSIS CATHETER (Right: Neck)  Patient Location: PACU  Anesthesia Type:MAC  Level of Consciousness: awake  Airway & Oxygen Therapy: Patient Spontanous Breathing and Patient connected to nasal cannula oxygen  Post-op Assessment: Report given to RN and Post -op Vital signs reviewed and stable  Post vital signs: Reviewed and stable  Last Vitals:  Vitals Value Taken Time  BP 152/75 12/21/21 1147  Temp    Pulse 64 12/21/21 1148  Resp 10 12/21/21 1148  SpO2 96 % 12/21/21 1148  Vitals shown include unvalidated device data.  Last Pain:  Vitals:   12/21/21 0757  TempSrc: Oral  PainSc: 0-No pain         Complications: No notable events documented.

## 2021-12-21 NOTE — Anesthesia Postprocedure Evaluation (Signed)
Anesthesia Post Note  Patient: [name]  Procedure(s) Performed: LEFT ARM ARTERIOVENOUS (AV) FISTULA CREATION (Left: Arm Lower) INSERTION OF TUNNELED DIALYSIS CATHETER (Right: Neck)  Patient location during evaluation: PACU Anesthesia Type: MAC Level of consciousness: awake and alert Pain management: pain level controlled Vital Signs Assessment: post-procedure vital signs reviewed and stable Respiratory status: spontaneous breathing, nonlabored ventilation, respiratory function stable and patient connected to nasal cannula oxygen Cardiovascular status: blood pressure returned to baseline and stable Postop Assessment: no apparent nausea or vomiting Anesthetic complications: no   There were no known notable events for this encounter.   Last Vitals:  Vitals:   12/21/21 1230 12/21/21 1245  BP: (!) 185/84 (!) 174/91  Pulse: 70 68  Resp: 15 16  Temp:  (!) 36.4 C  SpO2: 94% 94%    Last Pain:  Vitals:   12/21/21 1245  TempSrc: Oral  PainSc: 0-No pain                 Trixie Rude

## 2021-12-23 ENCOUNTER — Encounter (HOSPITAL_COMMUNITY): Payer: Self-pay | Admitting: Vascular Surgery

## 2021-12-27 DIAGNOSIS — Z992 Dependence on renal dialysis: Secondary | ICD-10-CM | POA: Diagnosis not present

## 2021-12-27 DIAGNOSIS — N2581 Secondary hyperparathyroidism of renal origin: Secondary | ICD-10-CM | POA: Diagnosis not present

## 2021-12-27 DIAGNOSIS — N186 End stage renal disease: Secondary | ICD-10-CM | POA: Diagnosis not present

## 2021-12-28 ENCOUNTER — Telehealth: Payer: Self-pay | Admitting: Vascular Surgery

## 2021-12-29 ENCOUNTER — Ambulatory Visit (HOSPITAL_BASED_OUTPATIENT_CLINIC_OR_DEPARTMENT_OTHER): Payer: Medicare HMO | Admitting: Cardiovascular Disease

## 2021-12-29 DIAGNOSIS — N2581 Secondary hyperparathyroidism of renal origin: Secondary | ICD-10-CM | POA: Diagnosis not present

## 2021-12-29 DIAGNOSIS — Z992 Dependence on renal dialysis: Secondary | ICD-10-CM | POA: Diagnosis not present

## 2021-12-29 DIAGNOSIS — N186 End stage renal disease: Secondary | ICD-10-CM | POA: Diagnosis not present

## 2021-12-30 DIAGNOSIS — I5022 Chronic systolic (congestive) heart failure: Secondary | ICD-10-CM | POA: Diagnosis not present

## 2021-12-30 DIAGNOSIS — Z7985 Long-term (current) use of injectable non-insulin antidiabetic drugs: Secondary | ICD-10-CM | POA: Diagnosis not present

## 2021-12-30 DIAGNOSIS — E113592 Type 2 diabetes mellitus with proliferative diabetic retinopathy without macular edema, left eye: Secondary | ICD-10-CM | POA: Diagnosis not present

## 2021-12-30 DIAGNOSIS — Z961 Presence of intraocular lens: Secondary | ICD-10-CM | POA: Diagnosis not present

## 2021-12-30 DIAGNOSIS — E113511 Type 2 diabetes mellitus with proliferative diabetic retinopathy with macular edema, right eye: Secondary | ICD-10-CM | POA: Diagnosis not present

## 2021-12-30 DIAGNOSIS — E119 Type 2 diabetes mellitus without complications: Secondary | ICD-10-CM | POA: Diagnosis not present

## 2021-12-30 DIAGNOSIS — I10 Essential (primary) hypertension: Secondary | ICD-10-CM | POA: Diagnosis not present

## 2021-12-30 DIAGNOSIS — Z9842 Cataract extraction status, left eye: Secondary | ICD-10-CM | POA: Diagnosis not present

## 2021-12-30 DIAGNOSIS — Z9841 Cataract extraction status, right eye: Secondary | ICD-10-CM | POA: Diagnosis not present

## 2021-12-30 DIAGNOSIS — N185 Chronic kidney disease, stage 5: Secondary | ICD-10-CM | POA: Diagnosis not present

## 2021-12-30 DIAGNOSIS — H4313 Vitreous hemorrhage, bilateral: Secondary | ICD-10-CM | POA: Diagnosis not present

## 2021-12-30 DIAGNOSIS — Z794 Long term (current) use of insulin: Secondary | ICD-10-CM | POA: Diagnosis not present

## 2021-12-31 DIAGNOSIS — N2581 Secondary hyperparathyroidism of renal origin: Secondary | ICD-10-CM | POA: Diagnosis not present

## 2021-12-31 DIAGNOSIS — Z992 Dependence on renal dialysis: Secondary | ICD-10-CM | POA: Diagnosis not present

## 2021-12-31 DIAGNOSIS — N186 End stage renal disease: Secondary | ICD-10-CM | POA: Diagnosis not present

## 2022-01-03 DIAGNOSIS — N186 End stage renal disease: Secondary | ICD-10-CM | POA: Diagnosis not present

## 2022-01-03 DIAGNOSIS — N2581 Secondary hyperparathyroidism of renal origin: Secondary | ICD-10-CM | POA: Diagnosis not present

## 2022-01-03 DIAGNOSIS — Z992 Dependence on renal dialysis: Secondary | ICD-10-CM | POA: Diagnosis not present

## 2022-01-05 DIAGNOSIS — N2581 Secondary hyperparathyroidism of renal origin: Secondary | ICD-10-CM | POA: Diagnosis not present

## 2022-01-05 DIAGNOSIS — N186 End stage renal disease: Secondary | ICD-10-CM | POA: Diagnosis not present

## 2022-01-05 DIAGNOSIS — Z992 Dependence on renal dialysis: Secondary | ICD-10-CM | POA: Diagnosis not present

## 2022-01-06 ENCOUNTER — Telehealth: Payer: Self-pay | Admitting: *Deleted

## 2022-01-06 NOTE — Telephone Encounter (Signed)
Patient called wanting an appointment with PCP. He states he is in rehab and having shortness of breath, weakness and dizziness. Pt was winded when on the telephone.   Patient was instructed to go to hospital to be evaluated. I also consulted with Dr. Lacinda Axon and he also said pt needed to go to the hospital. Pt was instructed to go to hospital. Verbalized understanding.

## 2022-01-07 DIAGNOSIS — N186 End stage renal disease: Secondary | ICD-10-CM | POA: Diagnosis not present

## 2022-01-07 DIAGNOSIS — Z992 Dependence on renal dialysis: Secondary | ICD-10-CM | POA: Diagnosis not present

## 2022-01-07 DIAGNOSIS — N2581 Secondary hyperparathyroidism of renal origin: Secondary | ICD-10-CM | POA: Diagnosis not present

## 2022-01-10 DIAGNOSIS — N186 End stage renal disease: Secondary | ICD-10-CM | POA: Diagnosis not present

## 2022-01-10 DIAGNOSIS — N2581 Secondary hyperparathyroidism of renal origin: Secondary | ICD-10-CM | POA: Diagnosis not present

## 2022-01-10 DIAGNOSIS — E1122 Type 2 diabetes mellitus with diabetic chronic kidney disease: Secondary | ICD-10-CM | POA: Diagnosis not present

## 2022-01-10 DIAGNOSIS — Z992 Dependence on renal dialysis: Secondary | ICD-10-CM | POA: Diagnosis not present

## 2022-01-10 NOTE — Telephone Encounter (Signed)
Appt scheduled

## 2022-01-11 ENCOUNTER — Encounter (HOSPITAL_BASED_OUTPATIENT_CLINIC_OR_DEPARTMENT_OTHER): Payer: Self-pay | Admitting: Family

## 2022-01-11 ENCOUNTER — Ambulatory Visit (INDEPENDENT_AMBULATORY_CARE_PROVIDER_SITE_OTHER): Payer: Medicare HMO | Admitting: Family

## 2022-01-11 VITALS — BP 152/78 | HR 77 | Ht 70.0 in | Wt 185.7 lb

## 2022-01-11 DIAGNOSIS — I25118 Atherosclerotic heart disease of native coronary artery with other forms of angina pectoris: Secondary | ICD-10-CM

## 2022-01-11 DIAGNOSIS — E785 Hyperlipidemia, unspecified: Secondary | ICD-10-CM

## 2022-01-11 DIAGNOSIS — F32A Depression, unspecified: Secondary | ICD-10-CM

## 2022-01-11 DIAGNOSIS — I5042 Chronic combined systolic (congestive) and diastolic (congestive) heart failure: Secondary | ICD-10-CM

## 2022-01-11 DIAGNOSIS — N186 End stage renal disease: Secondary | ICD-10-CM | POA: Diagnosis not present

## 2022-01-11 NOTE — Progress Notes (Signed)
Office Visit    Patient Name: [name] Date of Encounter: 01/12/2022  PCP:  [name], MD   [place]  Cardiologist:  Skeet Latch, MD Advanced Practice Provider:  No care team member to display Electrophysiologist:  None   Chief Complaint    [name] is a 66 y.o. male presents today for heart failure follow up.  Past Medical History    Past Medical History:  Diagnosis Date   Anemia    Arthritis    hips shoulders   CAD (coronary artery disease) 03/03/2019   Cancer ([place])    Esophageal cancer 2022   CHF (congestive heart failure) ([place]) 2020   Chronic combined systolic and diastolic heart failure ([place]) 04/26/2018   Admitted 11/14-11/20/19-diuresed 10L   Depression 05/31/2021   Fatigue 10/14/2020   Hypertension    Myocardial infarction [facility])    Sleep apnea    uses a bipap machine   Stroke ([place]) 12/14/2020   no last weakness or paralysis   Type 2 diabetes mellitus with diabetic nephropathy ([place]) 04/23/2019   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   Past Surgical History:  Procedure Laterality Date   AV FISTULA PLACEMENT Left 12/21/2021   Procedure: LEFT ARM ARTERIOVENOUS (AV) FISTULA CREATION;  Surgeon: Rosetta Posner, MD;  Location: [facility];  Service: Vascular;  Laterality: Left;   BIOPSY  06/27/2019   Procedure: BIOPSY;  Surgeon: Rogene Houston, MD;  Location: [facility];  Service: Endoscopy;;  ascending colon polyp   BIOPSY  10/21/2020   Procedure: BIOPSY;  Surgeon: Rogene Houston, MD;  [facility];  Service: Endoscopy;;  esophageal,gastric polyp   BIOPSY  02/24/2021   Procedure: BIOPSY;  Surgeon: Rogene Houston, MD;  [facility];  Service: Endoscopy;;  distal and proximal esophageal biopsies    CARDIAC SURGERY     CATARACT EXTRACTION     COLONOSCOPY N/A 06/27/2019   [name]:Diverticulosis in the entire examined colon. tubular adenoma in ascending colon, 65m tubular adenoma in prox  sigmoid. external hemorrhoids   CORONARY ARTERY BYPASS GRAFT N/A 03/25/2019   Procedure: CORONARY ARTERY BYPASS GRAFTING (CABG) X  4 USING LEFT INTERNAL MAMMARY ARTERY AND RIGHT SAPHENOUS VEIN GRAFTS;  Surgeon: LLajuana Matte MD;  [place]  Service: Open Heart Surgery;  Laterality: N/A;   ESOPHAGOGASTRODUODENOSCOPY (EGD) WITH PROPOFOL N/A 10/21/2020   rehman:Normal hypopharynx.Normal proximal esophagus and mid esophagus.Esophageal mucosal changes consistent with long-segment Barrett's esophagus. (Focal low-grade dysplasia and atypia proximally) z line irregular 37 cm from incisors, 3 cm HH, single gastric polyp (fundic gland) normal duodenal bulb/second portion of duodenum, proximal margin of Barrett's at 34 cm   ESOPHAGOGASTRODUODENOSCOPY (EGD) WITH PROPOFOL N/A 02/24/2021   Procedure: ESOPHAGOGASTRODUODENOSCOPY (EGD) WITH PROPOFOL;  Surgeon: RRogene Houston MD;  [facility];  Service: Endoscopy;  Laterality: N/A;  1:35   ESOPHAGOGASTRODUODENOSCOPY (EGD) WITH PROPOFOL N/A 03/21/2021   Procedure: ESOPHAGOGASTRODUODENOSCOPY (EGD) WITH PROPOFOL;  Surgeon: HCarol Ada MD;  [place] [facility]  Service: Endoscopy;  Laterality: N/A;   ESOPHAGOGASTRODUODENOSCOPY (EGD) WITH PROPOFOL N/A 10/23/2021   Procedure: ESOPHAGOGASTRODUODENOSCOPY (EGD) WITH PROPOFOL;  Surgeon: DDoran Stabler MD;  [place] [facility]  Service: Gastroenterology;  Laterality: N/A;   EXCISION MORTON'S NEUROMA     EYE SURGERY Left    retina   INCISION AND DRAINAGE OF WOUND Right 06/28/2021   Procedure: IRRIGATION AND DEBRIDEMENT WOUND;  Surgeon: PCindra Presume MD;  [facility];  Service: Plastics;  Laterality: Right;  1.5 hour   INSERTION OF DIALYSIS CATHETER Right 12/21/2021   Procedure: INSERTION OF TUNNELED DIALYSIS CATHETER;  Surgeon: Rosetta Posner, MD;  [facility];  Service: Vascular;  Laterality: Right;   POLYPECTOMY  06/27/2019   Procedure: POLYPECTOMY;  Surgeon: Rogene Houston, MD;  [facility];  Service: Endoscopy;;  proximal sigmoid colon   RIGHT/LEFT HEART CATH AND CORONARY ANGIOGRAPHY N/A 03/12/2019   Procedure: RIGHT/LEFT HEART CATH AND CORONARY ANGIOGRAPHY;  Surgeon: [name], MD;  Location: [place] [facility];  Service: Cardiovascular;  Laterality: N/A;   SHOULDER ARTHROSCOPY WITH ROTATOR CUFF REPAIR AND SUBACROMIAL DECOMPRESSION Right 08/26/2020   Procedure: RIGHT SHOULDER MINI OPEN ROTATOR CUFF REPAIR AND SUBACROMIAL DECOMPRESSION WITH PATCH GRAFT;  Surgeon: Susa Day, MD;  Location: [facility];  Service: Orthopedics;  Laterality: Right;  90 MINS GENERAL WITH BLOCK   SKIN SPLIT GRAFT Right 06/28/2021   Procedure: SKIN GRAFT SPLIT THICKNESS;  Surgeon: [name], MD;  Location: [place];  Service: Plastics;  Laterality: Right;   TEE WITHOUT CARDIOVERSION N/A 03/25/2019   Procedure: TRANSESOPHAGEAL ECHOCARDIOGRAM (TEE);  Surgeon: [name], MD;  Location: [place];  Service: Open Heart Surgery;  Laterality: N/A;   Allergies  Allergies  Allergen Reactions   Bee Venom Anaphylaxis   Atorvastatin     myalgia   Diltiazem Itching   Reglan [Metoclopramide] Other (See Comments)    Suicidal    Rosuvastatin     myalgias   Valsartan Itching    History of Present Illness    [name] is a 66 y.o. male with a hx of CVA, combined systolic and diastolic heart failure, PAD, esophageal cancer, CAD post CABG, HTN, MI, DM2, ESRD on HD, PVC last seen 11/01/21. Pleasant gentleman who previously worked as a Forensic psychologist in [employer].  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  Blood pressure control has been difficult due to intolerance to amlodipine, lisinopril, hydrochlorothiazide, beta-blocker due to sensation of fatigue.  He has been started on Lasix by Dr. Moshe Cipro which improved his lower extremity edema.  At initial appointment he was started on doxazosin.  Follow-up with pharmacist to be started on valsartan.  He  then had subsequent orthostatic blood pressures and doxazosin was reduced.  He was evaluated in the ED 12/14/2020 noting dizziness, loss of balance.  He was not orthostatic.  MRI showed small subacute to chronic infarct in left centrum semiovale and no obvious to LVO. He was started in aspirin and recommended for outpatient follow-up with neurology at which time ASA was increased to '325mg'$ . ZIO ordered 12/24/20 due to lightheadedness with predominantly NSR, 3.5% PVC burden, 3 episodes NSVT up to 5 beats and up to 12 runs of SVT.    Carotid duplex 01/21/21 at outside provider with no significant stenosis bilaterally. Due to exertional dyspnea myoview performed 02/2021 with infarct with mild peri-infarct ischemia recommended for medical management given CKD. When seen 05/2021 was referred to psychology due to worsening depression in setting of multiple medical issues over the last year however was unable to be reached for appointment.   He was seen in clinic 08/04/21 noting weakness, depression, and volume up 6 pounds. Torsemide dose increased for 4 days, referred to SW, referred to home health as well as re-referred to psychology. He was seen 08/25/21 with weight up but edema decreased and same Torsemide dose continued.   Hospitalized 08/29/21-09/01/21 for decompensated heart failure  with weight gain of 15 lbs. He was treated with IV diuresis and discharged on Torsemide '40mg'$  QD. Diuresed 10 pounds with discharge weight 203.2 lbs.   Admitted 4/18 - 10/04/2021 to [facility] due to hematemesis.  Underwent EGD with RFA 09/27/2021.  Hemoglobin remained stable without transfusion.  GI felt symptoms were treated to recent RFA and did not recommend repeat endoscopy.  Aspirin was able to be resumed on discharge.  He underwent PET scan due to known esophageal adenocarcinoma which oncology felt represented local irritation/inflammation and not progression.  He was discharged with home health.  Admitted 5/10 - 10/28/2021 to [facility]  with acute on chronic heart failure.  He was diuresed 8.2 L and down 17 pounds.  He was discharged on torsemide 40 mg daily.  He required 4 units PRBC due to anemia.  Underwent EGD with esophageal stenosis, circumferential clean ulceration in distal esophagus. Discharge weight 187.66.  On 01/07/22 he started HD. Follows with Dr. Suezanne Cheshire.   Presents today for follow up independently from SNF. His chief complaint remains fatigue since CAB three years ago. Verbalizes that depression is worsening and has considered suidicide but no plan and contracts for safety today. Notes HD makes him tired. BP at SNF routinely in the 140s per his report - no records for review. Still making urine and taking Torsemide. Edema improved. No orthopnea, PND, chest pain. Notes exertional dyspnea. Shares with me that he is predominantly wheelchair bound at facility with exception of PT/OT.   EKGs/Labs/Other Studies Reviewed:   The following studies were reviewed today:  Echo 04/26/2018: - Left ventricle: The cavity size was normal. There was minimal    concentric hypertrophy. Systolic function was mildly reduced. The    estimated ejection fraction was in the range of 45% to 50%.    Diffuse hypokinesis. Findings consistent with left ventricular    diastolic dysfunction, grade indeterminate. Doppler parameters    are consistent with high ventricular filling pressure.  - Mitral valve: Mildly calcified annulus. There was mild    regurgitation.  - Left atrium: The atrium was severely dilated.  - Right atrium: The atrium was mildly dilated.  - Inferior vena cava: The vessel was dilated. The respirophasic    diameter changes were blunted (< 50%), consistent with elevated    central venous pressure. Estimated CVP 15 mmHg.    ___________________________________________________________  Right/Left Heart Cath and Coronary Angiography 03/12/2019: Mid LAD lesion is 80% stenosed. Ost LAD to Mid LAD lesion is 50%  stenosed. 1st Diag lesion is 80% stenosed. 2nd Diag lesion is 75% stenosed. Mid LM to Dist LM lesion is 25% stenosed. Prox Cx lesion is 90% stenosed. 1st Mrg lesion is 80% stenosed. 2nd Mrg lesion is 80% stenosed. Dist RCA lesion is 90% stenosed. Mid RCA lesion is 75% stenosed. RPAV lesion is 80% stenosed. LV end diastolic pressure is moderately elevated. There is no aortic valve stenosis. Hemodynamic findings consistent with mild pulmonary hypertension.   Severe multivessel CAD.  Plan for outpatient evaluation for CABG>  His symptoms are all with exertion.  He feels fine at rest.  I instructed him to avoid any strenuous activity, or anything that would bring on shortness of breath.  Continue aspirin and Imdur.   Continue Crestor.    ___________________________________________________________  Echo 03/15/2019  1. Left ventricular ejection fraction, by visual estimation, is 45 to  50%. The left ventricle has mildly decreased function. There is mildly  increased left ventricular hypertrophy.   2. Left ventricular diastolic Doppler  parameters are consistent with  impaired relaxation pattern of LV diastolic filling.   3. Global right ventricle has normal systolic function.The right  ventricular size is normal. No increase in right ventricular wall  thickness.   4. Left atrial size was severely dilated.   5. Right atrial size was normal.   6. Mild mitral annular calcification.   7. Moderate calcification of the mitral valve leaflet(s).   8. Moderate thickening of the mitral valve leaflet(s).   9. The mitral valve is abnormal. Trace mitral valve regurgitation. No  evidence of mitral stenosis.  10. The tricuspid valve is normal in structure. Tricuspid valve  regurgitation is trivial.  11. The aortic valve is tricuspid Aortic valve regurgitation was not  visualized by color flow Doppler. Mild aortic valve sclerosis without  stenosis.  12. The pulmonic valve was not well visualized.  Pulmonic valve  regurgitation is mild by color flow Doppler.  13. Mildly elevated pulmonary artery systolic pressure.   ___________________________________________________________  VAS US DOPPLER PRE CABG 03/21/2019:  Right Carotid: Velocities in the right ICA are consistent with a 1-39%  stenosis.   Left Carotid: There is no evidence of stenosis in the left ICA.  Vertebrals: Bilateral vertebral arteries demonstrate antegrade flow.   Bilateral Extremity: Doppler waveforms remain within normal limits with  compression bilaterally for the radial arteries. Doppler waveforms remain  within normal limits with compression bilaterally for the ulnar arteries.   ___________________________________________________________  ECHO INTRAOPERATIVE TEE 03/25/2019: POST-OP IMPRESSIONS  - Aorta: The aorta appears unchanged from pre-bypass.  - Left Atrial Appendage: The left atrial appendage appears unchanged from  pre-bypass.  - Aortic Valve: The aortic valve appears unchanged from pre-bypass.  - Mitral Valve: The mitral valve appears unchanged from pre-bypass.  - Tricuspid Valve: The tricuspid valve appears unchanged from pre-bypass.  - Pericardium: The pericardium appears unchanged from pre-bypass.   ___________________________________________________________  VAS [place] LOWER EXTREMITY VENOUS 03/30/2019: Right: There is no evidence of deep vein thrombosis in the lower  extremity.  Left: No evidence of common femoral vein obstruction.   ___________________________________________________________  Echo 05/29/2019 1. Left ventricular ejection fraction, by visual estimation, is 50 to  55%. The left ventricle has low normal function. There is mildly increased  left ventricular hypertrophy.   2. Basal anterolateral segment, basal inferior segment, and basal  inferolateral segment are abnormal.   3. The left ventricle demonstrates regional wall motion abnormalities.   4. Global right ventricle has  mildly reduced systolic function.The right  ventricular size is normal. No increase in right ventricular wall  thickness.   5. Left atrial size was moderately dilated.   6. Right atrial size was normal.   7. Moderate mitral annular calcification.   8. The mitral valve is grossly normal. Trivial mitral valve  regurgitation.   9. The tricuspid valve is grossly normal. Tricuspid valve regurgitation  is trivial.  10. The aortic valve is tricuspid. Aortic valve regurgitation is not  visualized.  11. The pulmonic valve was grossly normal. Pulmonic valve regurgitation is  trivial.  12. Normal pulmonary artery systolic pressure.  13. The tricuspid regurgitant velocity is 2.30 m/s, and with an assumed  right atrial pressure of 3 mmHg, the estimated right ventricular systolic  pressure is normal at 24.2 mmHg.  14. The inferior vena cava is normal in size with greater than 50%  respiratory variability, suggesting right atrial pressure of 3 mmHg.   ___________________________________________________________  Echo 09/2019:  1. Left ventricular ejection fraction, by estimation, is 55  to 60%. The  left ventricle has normal function. The left ventricle has no regional  wall motion abnormalities. There is moderate left ventricular hypertrophy.  Left ventricular diastolic  parameters are consistent with Grade I diastolic dysfunction (impaired  relaxation).   2. Right ventricular systolic function is normal. The right ventricular  size is normal.   3. Left atrial size was severely dilated.   4. The mitral valve is normal in structure. Trivial mitral valve  regurgitation. No evidence of mitral stenosis.   5. The aortic valve was not well visualized. Aortic valve regurgitation  is not visualized. No aortic stenosis is present.   6. The inferior vena cava is normal in size with greater than 50%  respiratory variability, suggesting right atrial pressure of 3 mmHg.    ___________________________________________________________  Carotid duplex 01/21/21  Right: Mild to moderate partially calcified plaque. No significant stenosis.  Left: Mild partially calcified plaque. No signifcant stenosis.   ___________________________________________________________  [name] 02/2021 Findings are consistent with prior myocardial infarction with peri-infarct ischemia. The study is high risk.   No ST deviation was noted.   LV perfusion is abnormal. There is evidence of ischemia. There is evidence of infarction. Defect 1: There is a medium defect with moderate reduction in uptake present in the mid to basal inferior and inferolateral location(s) that is fixed. There is abnormal wall motion in the defect area. Consistent with infarction and peri-infarct ischemia.   Left ventricular function is abnormal. Global function is severely reduced. There was a single regional abnormality. Nuclear stress EF: 28 %. The left ventricular ejection fraction is severely decreased (<30%). End diastolic cavity size is moderately enlarged. End systolic cavity size is moderately enlarged.   Prior study available for comparison from 09/25/2019. There are changes compared to prior study. The left ventricular ejection fraction has decreased. LVEF 35%, moderate size and intensity fixed inferior defect suggestive of scar with mild peri-infarct ischemia   Moderate size and intensity, mostly fixed basal to mid inferior and inferolateral perfusion defect (SDS 1), suggestive of scar with minimal peri-infarct ischemia. LVEF 28% with global hypokinesis and basal to mid inferior akinesis. This is a high risk study. In comparison with a prior study in 2021, the LVEF is lower (was previously 35%), however, there are no new reversible perfusion defects.  ___________________________________________________________  Echo 07/25/21  1. Left ventricular ejection fraction, by estimation, is 40 to 45%. The  left ventricle has  mildly decreased function. The left ventricle  demonstrates regional wall motion abnormalities (see scoring  diagram/findings for description). There is mild  concentric left ventricular hypertrophy. Left ventricular diastolic  function could not be evaluated. There is akinesis of the left  ventricular, basal inferior wall and inferoseptal wall.   2. Right ventricular systolic function is moderately reduced. The right  ventricular size is severely enlarged. There is moderately elevated  pulmonary artery systolic pressure. The estimated right ventricular  systolic pressure is 09.3 mmHg.   3. Left atrial size was moderately dilated.   4. The mitral valve is degenerative. Mild to moderate mitral valve  regurgitation. No evidence of mitral stenosis.   5. The aortic valve is normal in structure. Aortic valve regurgitation is  not visualized. No aortic stenosis is present.   6. Aortic dilatation noted. There is mild dilatation of the aortic root,  measuring 39 mm.   7. The inferior vena cava is dilated in size with <50% respiratory  variability, suggesting right atrial pressure of 15 mmHg.   EKG:  No EKG today  Recent Labs: 10/20/2021: ALT 13; B Natriuretic Peptide 3,286.7 10/23/2021: Magnesium 2.1 11/01/2021: Platelets 254; TSH 2.490 12/21/2021: BUN 62; Creatinine, Ser 5.30; Hemoglobin 9.9; Potassium 3.8; Sodium 138  Recent Lipid Panel    Component Value Date/Time   CHOL 168 12/25/2020 1208   TRIG 272 (H) 12/25/2020 1208   HDL 48 12/25/2020 1208   CHOLHDL 3.5 12/25/2020 1208   CHOLHDL 3.9 04/26/2018 1011   VLDL 20 04/26/2018 1011   LDLCALC 76 12/25/2020 1208    Home Medications   No outpatient medications have been marked as taking for the 01/11/22 encounter (Office Visit) with Loel Dubonnet, NP.     Review of Systems      All other systems reviewed and are otherwise negative except as noted above.  Physical Exam    VS:  BP (!) 152/78   Pulse 77   Ht '5\' 10"'$  (1.778 m)    Wt 185 lb 11.2 oz (84.2 kg)   SpO2 94%   BMI 26.65 kg/m  , BMI Body mass index is 26.65 kg/m.  Wt Readings from Last 3 Encounters:  01/11/22 185 lb 11.2 oz (84.2 kg)  12/08/21 173 lb (78.5 kg)  11/01/21 185 lb 1.6 oz (84 kg)    GEN: Well nourished, well developed, in no acute distress. HEENT: normal. Neck: Supple, no JVD, carotid bruits, or masses. Cardiac: RRR, no murmurs, rubs, or gallops. No clubbing, cyanosis, edema.  Radials/PT 2+ and equal bilaterally.  Respiratory:  Respirations regular and unlabored, clear to auscultation bilaterally. GI: Soft, nontender, nondistended. MS: No deformity or atrophy. Skin: Warm and dry, no rash. Neuro:  Strength and sensation are intact. Psych: Normal affect.  Assessment & Plan    Fatigue - This remains his chief complaint. We again discussed that this is multifactorial HF, cancer, ESRD. Recent CBC, thyroid panel normal. He is on supplements for vitamin D, B12, and folate at SNF. Will defer further management to SNF provider as no concerning cardiac symptoms today.   CAD s/p CABG - Myoview 02/2021 with infarct with mild peri-infarct ischemia recommended for medical management given CKD. GDMT includes aspirin, Repatha, Carvedilol. No anginal symptoms. No indication for ischemic evaluation at this time.  Heart healthy diet and regular cardiovascular exercise encouraged.    PVC / SVT / VT -ZIO 12/2020 with predominantly NSR with PVC burden 3.5%, 3 episodes NSVT up to 5 beats and up to 12 runs of SVT.Denies palpitations. Continue Carvedilol 12.'5mg'$  BID. Diltiazem contraindicated in setting of HF.   HLD - Statin intolerance. On Repatha.  HTN-  BP per his report 140s at SNF. Continue Coreg 12.'5mg'$  BID, Doxazosin '4mg'$  QD, Imdur '15mg'$  BID, Hydralazine '100mg'$  TID. Provided instruction for SNF to contact our office if BP consistently >140/90. If noted, could increase Imdur or Doxazosin dose.   HFrEF - 07/2021 LVEF 40-45%. GDMT limited by renal function. Now  diuresed per HD.  Continue Torsemide as he is still able to make urine. Will defer dosing to nephrology. Present GDMT includes Coreg, Hydralazine, Imdur, Torsemide. Fluid restriction <2L and low sodium diet encouraged. Daily weights encouraged at SNF.    Depression - Follows with PCP. On Zoloft. Encouraged to discuss possible alternate agent with PCP as not in remission. Has been referred to psychology Dr. Michail Sermon - they were unable to reach patient to schedule appointment. Mr. Nehring is understandably frustrated with depression and its affect on his wellbeing. Re-referred today and provided him and SNF phone number to call to schedule.  He does note some suicidal thoughts but no plan and contracts for safety today.    History of CVA- Follows with neurology.   CKD - Follows with Dr. Clover Mealy of nephrology. Careful titration of diuretic and antihypertensive  Esophageal cancer / anemia - Follows with oncology. EGD upcoming 04/07/22  Disposition: Follow up in November with Dr. Oval Linsey.  Signed, Loel Dubonnet, NP 01/12/2022, 8:58 PM [facility]

## 2022-01-11 NOTE — Patient Instructions (Addendum)
Medication Instructions:  Continue your current medications.   *If you need a refill on your cardiac medications before your next appointment, please call your pharmacy*  Lab Work: None ordered today.   Testing/Procedures: None ordered today.   Follow-Up: At [facility], you and your health needs are our priority.  As part of our continuing mission to provide you with exceptional heart care, we have created designated Provider Care Teams.  These Care Teams include your primary Cardiologist (physician) and Advanced Practice Providers (APPs -  Physician Assistants and Nurse Practitioners) who all work together to provide you with the care you need, when you need it.  We recommend signing up for the patient portal called "MyChart".  Sign up information is provided on this After Visit Summary.  MyChart is used to connect with patients for Virtual Visits (Telemedicine).  Patients are able to view lab/test results, encounter notes, upcoming appointments, etc.  Non-urgent messages can be sent to your provider as well.   To learn more about what you can do with MyChart, go to [email or web address].    Your next appointment:   Thursday, November 9th at 9:20 AM with Dr. Oval Linsey  Other Instructions  You have been referred to psychology today. Mental health is an important part of heart health. You have been referred to Dr. Elias Else. He is located at [facility] Primary care at [facility]. They will contact you to schedule an appointment. You may call [phone] to schedule an appointment.

## 2022-01-12 ENCOUNTER — Encounter (HOSPITAL_BASED_OUTPATIENT_CLINIC_OR_DEPARTMENT_OTHER): Payer: Self-pay | Admitting: Family

## 2022-01-12 DIAGNOSIS — R45851 Suicidal ideations: Secondary | ICD-10-CM | POA: Diagnosis not present

## 2022-01-12 DIAGNOSIS — N2581 Secondary hyperparathyroidism of renal origin: Secondary | ICD-10-CM | POA: Diagnosis not present

## 2022-01-12 DIAGNOSIS — Z992 Dependence on renal dialysis: Secondary | ICD-10-CM | POA: Diagnosis not present

## 2022-01-12 DIAGNOSIS — F321 Major depressive disorder, single episode, moderate: Secondary | ICD-10-CM | POA: Diagnosis not present

## 2022-01-12 DIAGNOSIS — N186 End stage renal disease: Secondary | ICD-10-CM | POA: Diagnosis not present

## 2022-01-13 ENCOUNTER — Ambulatory Visit: Payer: Medicare HMO | Admitting: Family Medicine

## 2022-01-14 DIAGNOSIS — Z992 Dependence on renal dialysis: Secondary | ICD-10-CM | POA: Diagnosis not present

## 2022-01-14 DIAGNOSIS — N186 End stage renal disease: Secondary | ICD-10-CM | POA: Diagnosis not present

## 2022-01-14 DIAGNOSIS — N2581 Secondary hyperparathyroidism of renal origin: Secondary | ICD-10-CM | POA: Diagnosis not present

## 2022-01-17 DIAGNOSIS — R41841 Cognitive communication deficit: Secondary | ICD-10-CM | POA: Diagnosis not present

## 2022-01-17 DIAGNOSIS — Z992 Dependence on renal dialysis: Secondary | ICD-10-CM | POA: Diagnosis not present

## 2022-01-17 DIAGNOSIS — R2689 Other abnormalities of gait and mobility: Secondary | ICD-10-CM | POA: Diagnosis not present

## 2022-01-17 DIAGNOSIS — M6281 Muscle weakness (generalized): Secondary | ICD-10-CM | POA: Diagnosis not present

## 2022-01-17 DIAGNOSIS — N2581 Secondary hyperparathyroidism of renal origin: Secondary | ICD-10-CM | POA: Diagnosis not present

## 2022-01-17 DIAGNOSIS — N186 End stage renal disease: Secondary | ICD-10-CM | POA: Diagnosis not present

## 2022-01-18 DIAGNOSIS — R41841 Cognitive communication deficit: Secondary | ICD-10-CM | POA: Diagnosis not present

## 2022-01-18 DIAGNOSIS — M6281 Muscle weakness (generalized): Secondary | ICD-10-CM | POA: Diagnosis not present

## 2022-01-18 DIAGNOSIS — R2689 Other abnormalities of gait and mobility: Secondary | ICD-10-CM | POA: Diagnosis not present

## 2022-01-19 DIAGNOSIS — R2689 Other abnormalities of gait and mobility: Secondary | ICD-10-CM | POA: Diagnosis not present

## 2022-01-19 DIAGNOSIS — M6281 Muscle weakness (generalized): Secondary | ICD-10-CM | POA: Diagnosis not present

## 2022-01-19 DIAGNOSIS — N186 End stage renal disease: Secondary | ICD-10-CM | POA: Diagnosis not present

## 2022-01-19 DIAGNOSIS — Z992 Dependence on renal dialysis: Secondary | ICD-10-CM | POA: Diagnosis not present

## 2022-01-19 DIAGNOSIS — N2581 Secondary hyperparathyroidism of renal origin: Secondary | ICD-10-CM | POA: Diagnosis not present

## 2022-01-19 DIAGNOSIS — R41841 Cognitive communication deficit: Secondary | ICD-10-CM | POA: Diagnosis not present

## 2022-01-20 DIAGNOSIS — R41841 Cognitive communication deficit: Secondary | ICD-10-CM | POA: Diagnosis not present

## 2022-01-20 DIAGNOSIS — M6281 Muscle weakness (generalized): Secondary | ICD-10-CM | POA: Diagnosis not present

## 2022-01-20 DIAGNOSIS — R2689 Other abnormalities of gait and mobility: Secondary | ICD-10-CM | POA: Diagnosis not present

## 2022-01-21 DIAGNOSIS — Z992 Dependence on renal dialysis: Secondary | ICD-10-CM | POA: Diagnosis not present

## 2022-01-21 DIAGNOSIS — N186 End stage renal disease: Secondary | ICD-10-CM | POA: Diagnosis not present

## 2022-01-21 DIAGNOSIS — R2689 Other abnormalities of gait and mobility: Secondary | ICD-10-CM | POA: Diagnosis not present

## 2022-01-21 DIAGNOSIS — M6281 Muscle weakness (generalized): Secondary | ICD-10-CM | POA: Diagnosis not present

## 2022-01-21 DIAGNOSIS — R41841 Cognitive communication deficit: Secondary | ICD-10-CM | POA: Diagnosis not present

## 2022-01-21 DIAGNOSIS — N2581 Secondary hyperparathyroidism of renal origin: Secondary | ICD-10-CM | POA: Diagnosis not present

## 2022-01-23 DIAGNOSIS — I509 Heart failure, unspecified: Secondary | ICD-10-CM | POA: Diagnosis not present

## 2022-01-23 DIAGNOSIS — R1084 Generalized abdominal pain: Secondary | ICD-10-CM | POA: Diagnosis not present

## 2022-01-23 DIAGNOSIS — E1122 Type 2 diabetes mellitus with diabetic chronic kidney disease: Secondary | ICD-10-CM | POA: Diagnosis not present

## 2022-01-23 DIAGNOSIS — K5730 Diverticulosis of large intestine without perforation or abscess without bleeding: Secondary | ICD-10-CM | POA: Diagnosis not present

## 2022-01-23 DIAGNOSIS — R0989 Other specified symptoms and signs involving the circulatory and respiratory systems: Secondary | ICD-10-CM | POA: Diagnosis not present

## 2022-01-23 DIAGNOSIS — Z992 Dependence on renal dialysis: Secondary | ICD-10-CM | POA: Diagnosis not present

## 2022-01-23 DIAGNOSIS — E1142 Type 2 diabetes mellitus with diabetic polyneuropathy: Secondary | ICD-10-CM | POA: Diagnosis not present

## 2022-01-23 DIAGNOSIS — N184 Chronic kidney disease, stage 4 (severe): Secondary | ICD-10-CM | POA: Diagnosis not present

## 2022-01-23 DIAGNOSIS — R0689 Other abnormalities of breathing: Secondary | ICD-10-CM | POA: Diagnosis not present

## 2022-01-23 DIAGNOSIS — S2241XA Multiple fractures of ribs, right side, initial encounter for closed fracture: Secondary | ICD-10-CM | POA: Diagnosis not present

## 2022-01-23 DIAGNOSIS — I130 Hypertensive heart and chronic kidney disease with heart failure and stage 1 through stage 4 chronic kidney disease, or unspecified chronic kidney disease: Secondary | ICD-10-CM | POA: Diagnosis not present

## 2022-01-23 DIAGNOSIS — R072 Precordial pain: Secondary | ICD-10-CM | POA: Diagnosis not present

## 2022-01-23 DIAGNOSIS — J811 Chronic pulmonary edema: Secondary | ICD-10-CM | POA: Diagnosis not present

## 2022-01-23 DIAGNOSIS — K5790 Diverticulosis of intestine, part unspecified, without perforation or abscess without bleeding: Secondary | ICD-10-CM | POA: Diagnosis not present

## 2022-01-23 DIAGNOSIS — J90 Pleural effusion, not elsewhere classified: Secondary | ICD-10-CM | POA: Diagnosis not present

## 2022-01-23 DIAGNOSIS — N281 Cyst of kidney, acquired: Secondary | ICD-10-CM | POA: Diagnosis not present

## 2022-01-23 DIAGNOSIS — I471 Supraventricular tachycardia: Secondary | ICD-10-CM | POA: Diagnosis not present

## 2022-01-23 DIAGNOSIS — R079 Chest pain, unspecified: Secondary | ICD-10-CM | POA: Diagnosis not present

## 2022-01-23 DIAGNOSIS — I959 Hypotension, unspecified: Secondary | ICD-10-CM | POA: Diagnosis not present

## 2022-01-23 DIAGNOSIS — R0602 Shortness of breath: Secondary | ICD-10-CM | POA: Diagnosis not present

## 2022-01-23 DIAGNOSIS — R0789 Other chest pain: Secondary | ICD-10-CM | POA: Diagnosis not present

## 2022-01-24 ENCOUNTER — Encounter (HOSPITAL_COMMUNITY): Payer: Self-pay

## 2022-01-24 ENCOUNTER — Inpatient Hospital Stay: Admit: 2022-01-24 | Payer: Medicare HMO | Admitting: Internal Medicine

## 2022-01-25 DIAGNOSIS — I5021 Acute systolic (congestive) heart failure: Secondary | ICD-10-CM | POA: Diagnosis not present

## 2022-01-25 DIAGNOSIS — J90 Pleural effusion, not elsewhere classified: Secondary | ICD-10-CM | POA: Diagnosis not present

## 2022-01-25 DIAGNOSIS — J811 Chronic pulmonary edema: Secondary | ICD-10-CM | POA: Diagnosis not present

## 2022-01-25 DIAGNOSIS — D649 Anemia, unspecified: Secondary | ICD-10-CM | POA: Diagnosis not present

## 2022-01-25 DIAGNOSIS — R531 Weakness: Secondary | ICD-10-CM | POA: Diagnosis not present

## 2022-01-25 DIAGNOSIS — R0602 Shortness of breath: Secondary | ICD-10-CM | POA: Diagnosis not present

## 2022-01-25 DIAGNOSIS — E119 Type 2 diabetes mellitus without complications: Secondary | ICD-10-CM | POA: Diagnosis not present

## 2022-01-26 ENCOUNTER — Encounter: Payer: Self-pay | Admitting: Vascular Surgery

## 2022-01-26 ENCOUNTER — Ambulatory Visit (INDEPENDENT_AMBULATORY_CARE_PROVIDER_SITE_OTHER): Payer: Medicare HMO | Admitting: Vascular Surgery

## 2022-01-26 VITALS — BP 176/81 | HR 78 | Temp 96.4°F | Resp 14 | Ht 70.0 in | Wt 182.0 lb

## 2022-01-26 DIAGNOSIS — N186 End stage renal disease: Secondary | ICD-10-CM

## 2022-01-26 DIAGNOSIS — M6281 Muscle weakness (generalized): Secondary | ICD-10-CM | POA: Diagnosis not present

## 2022-01-26 DIAGNOSIS — R41841 Cognitive communication deficit: Secondary | ICD-10-CM | POA: Diagnosis not present

## 2022-01-26 DIAGNOSIS — R2689 Other abnormalities of gait and mobility: Secondary | ICD-10-CM | POA: Diagnosis not present

## 2022-01-26 DIAGNOSIS — Z992 Dependence on renal dialysis: Secondary | ICD-10-CM

## 2022-01-26 DIAGNOSIS — N2581 Secondary hyperparathyroidism of renal origin: Secondary | ICD-10-CM | POA: Diagnosis not present

## 2022-01-26 NOTE — Progress Notes (Signed)
[facility] [place]  Patient name: [name] MRN: [id number] DOB: 1955-12-22 Sex: male  REASON FOR VISIT: Follow-up brachiocephalic fistula creation on 12/21/2021  HPI: [name] is a 66 y.o. male here today for follow-up.  He has no symptoms of steal.  He has had complete healing of his antecubital incision  Current Outpatient Medications  Medication Sig Dispense Refill   acetaminophen (TYLENOL) 325 MG tablet Take 2 tablets (650 mg total) by mouth every 6 (six) hours as needed for mild pain, moderate pain or fever. 30 tablet 0   albuterol (VENTOLIN HFA) 108 (90 Base) MCG/ACT inhaler Inhale 2 puffs into the lungs every 6 (six) hours as needed. (Patient taking differently: Inhale 2 puffs into the lungs every 6 (six) hours as needed for wheezing or shortness of breath.) 18 g 5   ascorbic acid (VITAMIN C) 500 MG tablet Take 1 tablet (500 mg total) by mouth daily. 30 tablet 0   calcium acetate (PHOSLO) 667 MG capsule Take 667 mg by mouth in the morning, at noon, and at bedtime.     calcium-vitamin D (OSCAL WITH D) 500-5 MG-MCG tablet Take 1 tablet by mouth 2 (two) times daily.     carvedilol (COREG) 12.5 MG tablet Take 1 tablet (12.5 mg total) by mouth 2 (two) times daily. 180 tablet 3   cholecalciferol (VITAMIN D) 25 MCG (1000 UNIT) tablet Take 1,000 Units by mouth daily.     doxazosin (CARDURA) 4 MG tablet Take 1 tablet (4 mg total) by mouth daily. 90 tablet 1   EPINEPHrine 0.3 mg/0.3 mL IJ SOAJ injection Inject 0.3 mg into the muscle as needed for anaphylaxis. 1 each 4   ferrous sulfate (FEROSUL) 325 (65 FE) MG tablet Take 325 mg by mouth daily.     folic acid (FOLVITE) 681 MCG tablet Take 800 mcg by mouth daily.     Glucerna (GLUCERNA) LIQD Take 237 mLs by mouth 2 (two) times daily.     hydrALAZINE (APRESOLINE) 100 MG tablet Take 1 tablet (100 mg total) by mouth 3 (three) times daily.     hydrOXYzine (VISTARIL) 25 MG  capsule TAKE ONE CAPSULE BY MOUTH NIGHTLY AS NEEDED FOR ALLERGIES AND INSOMNIA 30 capsule 1   Insulin Pen Needle (B-D ULTRAFINE III SHORT PEN) 31G X 8 MM MISC 1 each by Does not apply route as directed. 100 each 3   isosorbide mononitrate (IMDUR) 30 MG 24 hr tablet Take 0.5 tablets (15 mg total) by mouth daily. 30 tablet    lidocaine (ASPERCREME LIDOCAINE) 4 % Place 1 patch onto the skin daily. Wear for 12 hours then remove, wait 12 hours before applying a new patch to lower back     Multiple Vitamins-Minerals (THERATRUM COMPLETE) TABS Take 1 tablet by mouth daily.     nitroGLYCERIN (NITROSTAT) 0.4 MG SL tablet Place 1 tablet (0.4 mg total) under the tongue every 5 (five) minutes as needed for chest pain. 25 tablet 2   omeprazole (PRILOSEC) 40 MG capsule Take 40 mg by mouth in the morning and at bedtime.     ondansetron (ZOFRAN) 4 MG tablet Take 4 mg by mouth every 8 (eight) hours as needed for nausea or vomiting.     oxyCODONE-acetaminophen (PERCOCET) 5-325 MG tablet Take 1 tablet by mouth every 6 (six) hours as needed for severe pain. 8 tablet 0   Polyethylene Glycol 3350 POWD Take 17 g by mouth daily. Mix in 240 mL of fluid  REPATHA SURECLICK 962 MG/ML SOAJ Inject 140 mg as directed every 14 (fourteen) days. 2 mL 11   sertraline (ZOLOFT) 100 MG tablet Take 1 and half tablet  by mouth daily 45 tablet 5   sucralfate (CARAFATE) 1 GM/10ML suspension Take 10 mLs (1 g total) by mouth 3 (three) times daily before meals. 420 mL 0   torsemide (DEMADEX) 20 MG tablet Take 40 mg by mouth daily.     traMADol (ULTRAM) 50 MG tablet Take 0.5 tablets (25 mg total) by mouth every 12 (twelve) hours as needed for moderate pain. 10 tablet 0   vitamin B-12 (CYANOCOBALAMIN) 1000 MCG tablet Take 1,000 mcg by mouth daily.     No current facility-administered medications for this visit.     PHYSICAL EXAM: Vitals:   01/26/22 1520  BP: (!) 176/81  Pulse: 78  Resp: 14  Temp: (!) 96.4 F (35.8 C)  TempSrc:  Temporal  SpO2: 97%  Weight: 182 lb (82.6 kg)  Height: '5\' 10"'$  (1.778 m)    GENERAL: The patient is a well-nourished male, in no acute distress. The vital signs are documented above. Well-healed antecubital incision.  2+ left radial pulse.  Very nice Brianna Bennett development and diameter of his cephalic vein in the upper arm.  The vein does run superficial in the subcutaneous tissue.  He does have some tortuosity of the vein.  MEDICAL ISSUES: Good Chimaobi Casebolt result for left upper arm AV fistula.  Would recommend waiting for a total of 3 months for use of the fistula.  This would be mid October.  After several successful dialysis sessions, would recommend removal of the catheter.  I am available for catheter removal when timing is appropriate   Rosetta Posner, MD FACS [facility] [place] Office Tel [phone]  Note: Portions of this report may have been transcribed using voice recognition software.  Every effort has been made to ensure accuracy; however, inadvertent computerized transcription errors may still be present.

## 2022-01-27 DIAGNOSIS — M6281 Muscle weakness (generalized): Secondary | ICD-10-CM | POA: Diagnosis not present

## 2022-01-27 DIAGNOSIS — R41841 Cognitive communication deficit: Secondary | ICD-10-CM | POA: Diagnosis not present

## 2022-01-27 DIAGNOSIS — R2689 Other abnormalities of gait and mobility: Secondary | ICD-10-CM | POA: Diagnosis not present

## 2022-01-28 ENCOUNTER — Inpatient Hospital Stay: Payer: Medicare HMO | Admitting: Family Medicine

## 2022-01-28 DIAGNOSIS — N2581 Secondary hyperparathyroidism of renal origin: Secondary | ICD-10-CM | POA: Diagnosis not present

## 2022-01-28 DIAGNOSIS — M6281 Muscle weakness (generalized): Secondary | ICD-10-CM | POA: Diagnosis not present

## 2022-01-28 DIAGNOSIS — N186 End stage renal disease: Secondary | ICD-10-CM | POA: Diagnosis not present

## 2022-01-28 DIAGNOSIS — R41841 Cognitive communication deficit: Secondary | ICD-10-CM | POA: Diagnosis not present

## 2022-01-28 DIAGNOSIS — R2689 Other abnormalities of gait and mobility: Secondary | ICD-10-CM | POA: Diagnosis not present

## 2022-01-28 DIAGNOSIS — Z992 Dependence on renal dialysis: Secondary | ICD-10-CM | POA: Diagnosis not present

## 2022-01-29 ENCOUNTER — Inpatient Hospital Stay (HOSPITAL_COMMUNITY)
Admission: EM | Admit: 2022-01-29 | Discharge: 2022-02-04 | DRG: 286 | Disposition: A | Discharge status: Skilled Nursing Facility | Payer: Medicare HMO | Source: Skilled Nursing Facility | Attending: Internal Medicine | Admitting: Internal Medicine

## 2022-01-29 ENCOUNTER — Emergency Department (HOSPITAL_COMMUNITY): Payer: Medicare HMO

## 2022-01-29 ENCOUNTER — Other Ambulatory Visit: Payer: Self-pay

## 2022-01-29 ENCOUNTER — Encounter (HOSPITAL_COMMUNITY): Payer: Self-pay

## 2022-01-29 DIAGNOSIS — Z6825 Body mass index (BMI) 25.0-25.9, adult: Secondary | ICD-10-CM

## 2022-01-29 DIAGNOSIS — I509 Heart failure, unspecified: Secondary | ICD-10-CM | POA: Diagnosis not present

## 2022-01-29 DIAGNOSIS — F32A Depression, unspecified: Secondary | ICD-10-CM | POA: Diagnosis present

## 2022-01-29 DIAGNOSIS — K2270 Barrett's esophagus without dysplasia: Secondary | ICD-10-CM | POA: Diagnosis present

## 2022-01-29 DIAGNOSIS — R0789 Other chest pain: Secondary | ICD-10-CM | POA: Diagnosis not present

## 2022-01-29 DIAGNOSIS — E785 Hyperlipidemia, unspecified: Secondary | ICD-10-CM | POA: Diagnosis not present

## 2022-01-29 DIAGNOSIS — I5022 Chronic systolic (congestive) heart failure: Secondary | ICD-10-CM | POA: Diagnosis not present

## 2022-01-29 DIAGNOSIS — Z992 Dependence on renal dialysis: Secondary | ICD-10-CM | POA: Diagnosis not present

## 2022-01-29 DIAGNOSIS — R2689 Other abnormalities of gait and mobility: Secondary | ICD-10-CM | POA: Diagnosis present

## 2022-01-29 DIAGNOSIS — I471 Supraventricular tachycardia: Secondary | ICD-10-CM | POA: Diagnosis present

## 2022-01-29 DIAGNOSIS — I5042 Chronic combined systolic (congestive) and diastolic (congestive) heart failure: Secondary | ICD-10-CM | POA: Diagnosis present

## 2022-01-29 DIAGNOSIS — Z833 Family history of diabetes mellitus: Secondary | ICD-10-CM

## 2022-01-29 DIAGNOSIS — R7989 Other specified abnormal findings of blood chemistry: Secondary | ICD-10-CM | POA: Diagnosis present

## 2022-01-29 DIAGNOSIS — Z66 Do not resuscitate: Secondary | ICD-10-CM | POA: Diagnosis present

## 2022-01-29 DIAGNOSIS — G4733 Obstructive sleep apnea (adult) (pediatric): Secondary | ICD-10-CM | POA: Diagnosis present

## 2022-01-29 DIAGNOSIS — E8889 Other specified metabolic disorders: Secondary | ICD-10-CM | POA: Diagnosis present

## 2022-01-29 DIAGNOSIS — Z91030 Bee allergy status: Secondary | ICD-10-CM

## 2022-01-29 DIAGNOSIS — R55 Syncope and collapse: Secondary | ICD-10-CM | POA: Diagnosis present

## 2022-01-29 DIAGNOSIS — Z87891 Personal history of nicotine dependence: Secondary | ICD-10-CM

## 2022-01-29 DIAGNOSIS — I25708 Atherosclerosis of coronary artery bypass graft(s), unspecified, with other forms of angina pectoris: Principal | ICD-10-CM | POA: Diagnosis present

## 2022-01-29 DIAGNOSIS — J9811 Atelectasis: Secondary | ICD-10-CM | POA: Diagnosis present

## 2022-01-29 DIAGNOSIS — I491 Atrial premature depolarization: Secondary | ICD-10-CM | POA: Diagnosis not present

## 2022-01-29 DIAGNOSIS — D631 Anemia in chronic kidney disease: Secondary | ICD-10-CM | POA: Diagnosis present

## 2022-01-29 DIAGNOSIS — I10 Essential (primary) hypertension: Secondary | ICD-10-CM | POA: Diagnosis present

## 2022-01-29 DIAGNOSIS — Z86010 Personal history of colonic polyps: Secondary | ICD-10-CM

## 2022-01-29 DIAGNOSIS — I25110 Atherosclerotic heart disease of native coronary artery with unstable angina pectoris: Secondary | ICD-10-CM | POA: Diagnosis not present

## 2022-01-29 DIAGNOSIS — Z8501 Personal history of malignant neoplasm of esophagus: Secondary | ICD-10-CM

## 2022-01-29 DIAGNOSIS — R0602 Shortness of breath: Secondary | ICD-10-CM | POA: Diagnosis not present

## 2022-01-29 DIAGNOSIS — R079 Chest pain, unspecified: Secondary | ICD-10-CM | POA: Diagnosis present

## 2022-01-29 DIAGNOSIS — I25118 Atherosclerotic heart disease of native coronary artery with other forms of angina pectoris: Secondary | ICD-10-CM | POA: Diagnosis present

## 2022-01-29 DIAGNOSIS — E1122 Type 2 diabetes mellitus with diabetic chronic kidney disease: Secondary | ICD-10-CM | POA: Diagnosis present

## 2022-01-29 DIAGNOSIS — I252 Old myocardial infarction: Secondary | ICD-10-CM

## 2022-01-29 DIAGNOSIS — R944 Abnormal results of kidney function studies: Secondary | ICD-10-CM | POA: Diagnosis present

## 2022-01-29 DIAGNOSIS — I25810 Atherosclerosis of coronary artery bypass graft(s) without angina pectoris: Secondary | ICD-10-CM | POA: Diagnosis not present

## 2022-01-29 DIAGNOSIS — Z8719 Personal history of other diseases of the digestive system: Secondary | ICD-10-CM

## 2022-01-29 DIAGNOSIS — Z8673 Personal history of transient ischemic attack (TIA), and cerebral infarction without residual deficits: Secondary | ICD-10-CM

## 2022-01-29 DIAGNOSIS — M19012 Primary osteoarthritis, left shoulder: Secondary | ICD-10-CM | POA: Diagnosis present

## 2022-01-29 DIAGNOSIS — K5900 Constipation, unspecified: Secondary | ICD-10-CM | POA: Diagnosis present

## 2022-01-29 DIAGNOSIS — E782 Mixed hyperlipidemia: Secondary | ICD-10-CM | POA: Diagnosis present

## 2022-01-29 DIAGNOSIS — R0689 Other abnormalities of breathing: Secondary | ICD-10-CM | POA: Diagnosis not present

## 2022-01-29 DIAGNOSIS — K219 Gastro-esophageal reflux disease without esophagitis: Secondary | ICD-10-CM | POA: Diagnosis present

## 2022-01-29 DIAGNOSIS — Z888 Allergy status to other drugs, medicaments and biological substances status: Secondary | ICD-10-CM

## 2022-01-29 DIAGNOSIS — R5383 Other fatigue: Secondary | ICD-10-CM | POA: Diagnosis not present

## 2022-01-29 DIAGNOSIS — Z951 Presence of aortocoronary bypass graft: Secondary | ICD-10-CM | POA: Diagnosis not present

## 2022-01-29 DIAGNOSIS — N184 Chronic kidney disease, stage 4 (severe): Secondary | ICD-10-CM | POA: Diagnosis not present

## 2022-01-29 DIAGNOSIS — Z794 Long term (current) use of insulin: Secondary | ICD-10-CM | POA: Diagnosis not present

## 2022-01-29 DIAGNOSIS — I132 Hypertensive heart and chronic kidney disease with heart failure and with stage 5 chronic kidney disease, or end stage renal disease: Secondary | ICD-10-CM | POA: Diagnosis present

## 2022-01-29 DIAGNOSIS — I200 Unstable angina: Secondary | ICD-10-CM | POA: Diagnosis not present

## 2022-01-29 DIAGNOSIS — M6281 Muscle weakness (generalized): Secondary | ICD-10-CM | POA: Diagnosis not present

## 2022-01-29 DIAGNOSIS — I770 Arteriovenous fistula, acquired: Secondary | ICD-10-CM | POA: Diagnosis not present

## 2022-01-29 DIAGNOSIS — M19011 Primary osteoarthritis, right shoulder: Secondary | ICD-10-CM | POA: Diagnosis present

## 2022-01-29 DIAGNOSIS — N186 End stage renal disease: Secondary | ICD-10-CM | POA: Diagnosis present

## 2022-01-29 DIAGNOSIS — C159 Malignant neoplasm of esophagus, unspecified: Secondary | ICD-10-CM

## 2022-01-29 DIAGNOSIS — R072 Precordial pain: Secondary | ICD-10-CM | POA: Diagnosis not present

## 2022-01-29 DIAGNOSIS — E119 Type 2 diabetes mellitus without complications: Secondary | ICD-10-CM | POA: Diagnosis not present

## 2022-01-29 DIAGNOSIS — R42 Dizziness and giddiness: Secondary | ICD-10-CM | POA: Diagnosis not present

## 2022-01-29 DIAGNOSIS — Z7401 Bed confinement status: Secondary | ICD-10-CM | POA: Diagnosis not present

## 2022-01-29 DIAGNOSIS — I2510 Atherosclerotic heart disease of native coronary artery without angina pectoris: Secondary | ICD-10-CM | POA: Diagnosis not present

## 2022-01-29 DIAGNOSIS — Z79899 Other long term (current) drug therapy: Secondary | ICD-10-CM

## 2022-01-29 DIAGNOSIS — M160 Bilateral primary osteoarthritis of hip: Secondary | ICD-10-CM | POA: Diagnosis present

## 2022-01-29 DIAGNOSIS — Z8249 Family history of ischemic heart disease and other diseases of the circulatory system: Secondary | ICD-10-CM

## 2022-01-29 DIAGNOSIS — I5020 Unspecified systolic (congestive) heart failure: Secondary | ICD-10-CM | POA: Diagnosis not present

## 2022-01-29 DIAGNOSIS — I472 Ventricular tachycardia, unspecified: Secondary | ICD-10-CM | POA: Diagnosis not present

## 2022-01-29 DIAGNOSIS — E440 Moderate protein-calorie malnutrition: Secondary | ICD-10-CM | POA: Diagnosis present

## 2022-01-29 DIAGNOSIS — R110 Nausea: Secondary | ICD-10-CM | POA: Diagnosis not present

## 2022-01-29 DIAGNOSIS — N250 Renal osteodystrophy: Secondary | ICD-10-CM | POA: Diagnosis not present

## 2022-01-29 DIAGNOSIS — J90 Pleural effusion, not elsewhere classified: Secondary | ICD-10-CM | POA: Diagnosis not present

## 2022-01-29 LAB — COMPREHENSIVE METABOLIC PANEL
ALT: 23 U/L (ref 0–44)
AST: 25 U/L (ref 15–41)
Albumin: 3.4 g/dL — ABNORMAL LOW (ref 3.5–5.0)
Alkaline Phosphatase: 163 U/L — ABNORMAL HIGH (ref 38–126)
Anion gap: 9 (ref 5–15)
BUN: 46 mg/dL — ABNORMAL HIGH (ref 8–23)
CO2: 24 mmol/L (ref 22–32)
Calcium: 8.5 mg/dL — ABNORMAL LOW (ref 8.9–10.3)
Chloride: 105 mmol/L (ref 98–111)
Creatinine, Ser: 4.11 mg/dL — ABNORMAL HIGH (ref 0.61–1.24)
GFR, Estimated: 15 mL/min — ABNORMAL LOW (ref >60)
Glucose, Bld: 137 mg/dL — ABNORMAL HIGH (ref 70–99)
Potassium: 3.8 mmol/L (ref 3.5–5.1)
Sodium: 138 mmol/L (ref 135–145)
Total Bilirubin: 0.4 mg/dL (ref 0.3–1.2)
Total Protein: 6.7 g/dL (ref 6.5–8.1)

## 2022-01-29 LAB — CBC WITH DIFFERENTIAL/PLATELET
Abs Immature Granulocytes: 0.06 10*3/uL (ref 0.00–0.07)
Basophils Absolute: 0.1 10*3/uL (ref 0.0–0.1)
Basophils Relative: 1 %
Eosinophils Absolute: 0.2 10*3/uL (ref 0.0–0.5)
Eosinophils Relative: 2 %
HCT: 28.4 % — ABNORMAL LOW (ref 39.0–52.0)
Hemoglobin: 8.8 g/dL — ABNORMAL LOW (ref 13.0–17.0)
Immature Granulocytes: 1 %
Lymphocytes Relative: 7 %
Lymphs Abs: 0.7 10*3/uL (ref 0.7–4.0)
MCH: 27 pg (ref 26.0–34.0)
MCHC: 31 g/dL (ref 30.0–36.0)
MCV: 87.1 fL (ref 80.0–100.0)
Monocytes Absolute: 1 10*3/uL (ref 0.1–1.0)
Monocytes Relative: 11 %
Neutro Abs: 7.7 10*3/uL (ref 1.7–7.7)
Neutrophils Relative %: 78 %
Platelets: 202 10*3/uL (ref 150–400)
RBC: 3.26 MIL/uL — ABNORMAL LOW (ref 4.22–5.81)
RDW: 16.8 % — ABNORMAL HIGH (ref 11.5–15.5)
WBC: 9.7 10*3/uL (ref 4.0–10.5)
nRBC: 0 % (ref 0.0–0.2)

## 2022-01-29 LAB — TROPONIN I (HIGH SENSITIVITY): Troponin I (High Sensitivity): 30 ng/L — ABNORMAL HIGH (ref <18)

## 2022-01-29 LAB — BRAIN NATRIURETIC PEPTIDE: B Natriuretic Peptide: 1906 pg/mL — ABNORMAL HIGH (ref 0.0–100.0)

## 2022-01-29 MED ORDER — ONDANSETRON HCL 4 MG/2ML IJ SOLN
4.0000 mg | Freq: Once | INTRAMUSCULAR | Status: AC
  Administered 2022-01-29: 4 mg via INTRAVENOUS
  Filled 2022-01-29: qty 2

## 2022-01-29 MED ORDER — ASPIRIN 81 MG PO CHEW
324.0000 mg | CHEWABLE_TABLET | Freq: Once | ORAL | Status: AC
  Administered 2022-01-29: 324 mg via ORAL
  Filled 2022-01-29: qty 4

## 2022-01-29 MED ORDER — NITROGLYCERIN 0.4 MG SL SUBL
0.4000 mg | SUBLINGUAL_TABLET | SUBLINGUAL | Status: AC | PRN
  Administered 2022-01-30 (×3): 0.4 mg via SUBLINGUAL
  Filled 2022-01-29: qty 1

## 2022-01-29 NOTE — ED Provider Notes (Signed)
[facility] EMERGENCY DEPARTMENT Provider Note   CSN: [id number] Arrival date & time: 01/29/22  2142     History {Add pertinent medical, surgical, social history, OB history to HPI:1} Chief Complaint  Patient presents with   Chest Pain    [name] is a 66 y.o. male.  The history is provided by the patient.  Chest Pain Associated symptoms: no fever   Patient extensive history including coronary artery disease, esophageal cancer, CHF, end-stage renal disease presents with chest pain and syncope Patient reports he was on the toilet and when he strained he began having chest pain described as tightness.  He reports he also had tingling and numbness in both of his arms.  No focal weakness.  He does report a brief syncopal episode but denies any traumatic injury.  He reports continued chest tightness and fatigue.  He reports shortness of breath.  He is also had nausea and vomiting. He has been on dialysis for at least a month and goes Monday/Wednesday/Friday    Past Medical History:  Diagnosis Date   Anemia    Arthritis    hips shoulders   CAD (coronary artery disease) 03/03/2019   Cancer ([place])    Esophageal cancer 2022   CHF (congestive heart failure) ([place]) 2020   Chronic combined systolic and diastolic heart failure ([place]) 04/26/2018   Admitted 11/14-11/20/19-diuresed 10L   Depression 05/31/2021   Fatigue 10/14/2020   Hypertension    Myocardial infarction [facility])    Sleep apnea    uses a bipap machine   Stroke ([place]) 12/14/2020   no last weakness or paralysis   Type 2 diabetes mellitus with diabetic nephropathy ([place]) 04/23/2019   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 Authorizing Provider  acetaminophen (TYLENOL) 325 MG tablet Take 2 tablets (650 mg total) by mouth every 6 (six) hours as needed for mild pain, moderate pain or fever. 08/02/21   Allie Bossier, MD  albuterol  (VENTOLIN HFA) 108 (90 Base) MCG/ACT inhaler Inhale 2 puffs into the lungs every 6 (six) hours as needed. Patient taking differently: Inhale 2 puffs into the lungs every 6 (six) hours as needed for wheezing or shortness of breath. 06/24/21   Coral Spikes, DO  ascorbic acid (VITAMIN C) 500 MG tablet Take 1 tablet (500 mg total) by mouth daily. 08/03/21   Allie Bossier, MD  calcium acetate (PHOSLO) 667 MG capsule Take 667 mg by mouth in the morning, at noon, and at bedtime.    [provider]  calcium-vitamin D (OSCAL WITH D) 500-5 MG-MCG tablet Take 1 tablet by mouth 2 (two) times daily.    [provider]  carvedilol (COREG) 12.5 MG tablet Take 1 tablet (12.5 mg total) by mouth 2 (two) times daily. 09/14/21   Loel Dubonnet, NP  cholecalciferol (VITAMIN D) 25 MCG (1000 UNIT) tablet Take 1,000 Units by mouth daily.    [provider]  doxazosin (CARDURA) 4 MG tablet Take 1 tablet (4 mg total) by mouth daily. 09/14/21   Loel Dubonnet, NP  EPINEPHrine 0.3 mg/0.3 mL IJ SOAJ injection Inject 0.3 mg into the muscle as needed for anaphylaxis. 01/25/21   [name], MD  ferrous sulfate (FEROSUL) 325 (65 FE) MG tablet Take 325 mg by mouth daily.    [provider]  folic acid (FOLVITE) 094 MCG tablet Take 800 mcg by mouth  daily.    [provider]  Glucerna (GLUCERNA) LIQD Take 237 mLs by mouth 2 (two) times daily.    [provider]  hydrALAZINE (APRESOLINE) 100 MG tablet Take 1 tablet (100 mg total) by mouth 3 (three) times daily. 11/01/21   Loel Dubonnet, NP  hydrOXYzine (VISTARIL) 25 MG capsule TAKE ONE CAPSULE BY MOUTH NIGHTLY AS NEEDED FOR ALLERGIES AND INSOMNIA 11/01/21   [name], MD  Insulin Pen Needle (B-D ULTRAFINE III SHORT PEN) 31G X 8 MM MISC 1 each by Does not apply route as directed. 05/30/19   Cassandria Anger, MD  isosorbide mononitrate (IMDUR) 30 MG 24 hr tablet Take 0.5 tablets (15 mg total) by mouth daily. 10/27/21    Domenic Polite, MD  lidocaine (ASPERCREME LIDOCAINE) 4 % Place 1 patch onto the skin daily. Wear for 12 hours then remove, wait 12 hours before applying a new patch to lower back    [provider]  Multiple Vitamins-Minerals (THERATRUM COMPLETE) TABS Take 1 tablet by mouth daily.    [provider]  nitroGLYCERIN (NITROSTAT) 0.4 MG SL tablet Place 1 tablet (0.4 mg total) under the tongue every 5 (five) minutes as needed for chest pain. 09/14/21   Loel Dubonnet, NP  omeprazole (PRILOSEC) 40 MG capsule Take 40 mg by mouth in the morning and at bedtime.    [provider]  ondansetron (ZOFRAN) 4 MG tablet Take 4 mg by mouth every 8 (eight) hours as needed for nausea or vomiting.    [provider]  oxyCODONE-acetaminophen (PERCOCET) 5-325 MG tablet Take 1 tablet by mouth every 6 (six) hours as needed for severe pain. 12/21/21   Rosetta Posner, MD  Polyethylene Glycol 3350 POWD Take 17 g by mouth daily. Mix in 240 mL of fluid    [provider]  REPATHA SURECLICK 409 MG/ML SOAJ Inject 140 mg as directed every 14 (fourteen) days. 09/14/21   Loel Dubonnet, NP  sertraline (ZOLOFT) 100 MG tablet Take 1 and half tablet  by mouth daily 09/20/21   [name], MD  sucralfate (CARAFATE) 1 GM/10ML suspension Take 10 mLs (1 g total) by mouth 3 (three) times daily before meals. 10/26/21   Domenic Polite, MD  torsemide (DEMADEX) 20 MG tablet Take 40 mg by mouth daily. 10/11/21   [provider]  traMADol (ULTRAM) 50 MG tablet Take 0.5 tablets (25 mg total) by mouth every 12 (twelve) hours as needed for moderate pain. 10/28/21   Arrien, Jimmy Picket, MD  vitamin B-12 (CYANOCOBALAMIN) 1000 MCG tablet Take 1,000 mcg by mouth daily.    [provider]      Allergies    Bee venom, Atorvastatin, Diltiazem, Reglan [metoclopramide], Rosuvastatin, and Valsartan    Review of Systems   Review of Systems  Constitutional:  Negative for fever.   Cardiovascular:  Positive for chest pain.  Neurological:  Positive for syncope.    Physical Exam Updated Vital Signs BP (!) 175/89   Pulse 94   Temp (!) 97.4 F (36.3 C) (Oral)   Resp 19   SpO2 93%  Physical Exam CONSTITUTIONAL: Chronically ill-appearing HEAD: Normocephalic/atraumatic EYES: EOMI/PERRL ENMT: Mucous membranes moist NECK: supple no meningeal signs SPINE/BACK:entire spine nontender CV: S1/S2 noted, no loud harsh murmurs LUNGS: Lungs are clear to auscultation bilaterally, no apparent distress ABDOMEN: soft, nontender, no rebound or guarding, bowel sounds noted throughout abdomen GU:no cva tenderness NEURO: Pt is awake/alert/appropriate, moves all extremitiesx4.  No facial droop.  Equal handgrips.  No arm or leg drift EXTREMITIES: pulses normal/equal, full ROM, dialysis access noted to left arm with thrill noted. SKIN: warm, color normal, dialysis catheter noted to his right chest PSYCH: no abnormalities of mood noted, alert and oriented to situation  ED Results / Procedures / Treatments   Labs (all labs ordered are listed, but only abnormal results are displayed) Labs Reviewed  CBC WITH DIFFERENTIAL/PLATELET - Abnormal; Notable for the following components:      Result Value   RBC 3.26 (*)    Hemoglobin 8.8 (*)    HCT 28.4 (*)    RDW 16.8 (*)    All other components within normal limits  COMPREHENSIVE METABOLIC PANEL - Abnormal; Notable for the following components:   Glucose, Bld 137 (*)    BUN 46 (*)    Creatinine, Ser 4.11 (*)    Calcium 8.5 (*)    Albumin 3.4 (*)    Alkaline Phosphatase 163 (*)    GFR, Estimated 15 (*)    All other components within normal limits  BRAIN NATRIURETIC PEPTIDE - Abnormal; Notable for the following components:   B Natriuretic Peptide 1,906.0 (*)    All other components within normal limits  TROPONIN I (HIGH SENSITIVITY) - Abnormal; Notable for the following components:   Troponin I (High Sensitivity) 30 (*)    All  other components within normal limits  TROPONIN I (HIGH SENSITIVITY)    EKG EKG Interpretation  Date/Time:  Saturday January 29 2022 22:21:17 EDT Ventricular Rate:  94 PR Interval:  180 QRS Duration: 120 QT Interval:  395 QTC Calculation: 494 R Axis:   116 Text Interpretation: Sinus rhythm Nonspecific intraventricular conduction delay T wave abnormality Abnormal ekg Confirmed by Ripley Fraise [phone]) on 01/29/2022 11:23:53 PM  Radiology DG Chest Port 1 View  Result Date: 01/29/2022 CLINICAL DATA:  Shortness of breath and dizziness. History of esophageal cancer. EXAM: PORTABLE CHEST 1 VIEW COMPARISON:  01/25/2022. FINDINGS: Heart is enlarged the mediastinal contour is stable. Sternotomy wires are present over the midline. A stable right internal jugular central venous catheter is noted. There is a small right pleural effusion with mild atelectasis or infiltrate at the right lung base. The left lung is clear. No pneumothorax. No acute osseous abnormality. IMPRESSION: 1. Small right pleural effusion with atelectasis or infiltrate. 2. Cardiomegaly. Electronically Signed   By: Brett Fairy M.D.   On: 01/29/2022 22:19    Procedures Procedures  {Document cardiac monitor, telemetry assessment procedure when appropriate:1}  Medications Ordered in ED Medications  nitroGLYCERIN (NITROSTAT) SL tablet 0.4 mg (has no administration in time range)  aspirin chewable tablet 324 mg (324 mg Oral Given 01/29/22 2354)  ondansetron (ZOFRAN) injection 4 mg (4 mg Intravenous Given 01/29/22 2355)    ED Course/ Medical Decision Making/ A&P Clinical Course as of 01/29/22 2358  Sat Jan 29, 2022  2322 Creatinine(!): 4.11 Chronic renal failure [DW]  2322 Hemoglobin(!): 8.8 Chronic anemia [DW]    Clinical Course User Index [DW] Ripley Fraise, MD                           Medical Decision Making Amount and/or Complexity of Data Reviewed Labs:  Decision-making details documented in ED  Course.  Risk OTC drugs. Prescription drug management.   This patient presents to the ED for concern of chest pain, this involves an extensive number of treatment options, and is a complaint that carries with it a high  risk of complications and morbidity.  The differential diagnosis includes but is not limited to acute coronary syndrome, aortic dissection, pulmonary embolism, pericarditis, pneumothorax, pneumonia, myocarditis, pleurisy, esophageal rupture    Comorbidities that complicate the patient evaluation: Patient's presentation is complicated by their history of renal failure, CAD, cancer  Social Determinants of Health: Patient's  resides in a nursing home   increases the complexity of managing their presentation  Additional history obtained: Additional history obtained from family Records reviewed previous admission documents  Lab Tests: I Ordered, and personally interpreted labs.  The pertinent results include: Chronic renal failure, chronic anemia  Imaging Studies ordered: I ordered imaging studies including X-ray chest   I independently visualized and interpreted imaging which showed no acute findings I agree with the radiologist interpretation  Cardiac Monitoring: The patient was maintained on a cardiac monitor.  I personally viewed and interpreted the cardiac monitor which showed an underlying rhythm of:  sinus rhythm  Medicines ordered and prescription drug management: I ordered medication including ***  for ***  Reevaluation of the patient after these medicines showed that the patient    {resolved/improved/worsened:23923::"improved"}  Test Considered: Patient is low risk / negative by ***, therefore do not feel that *** is indicated.  Critical Interventions:  ***  Consultations Obtained: I requested consultation with the {consultation:26851}, and discussed  findings as well as pertinent plan - they recommend: ***  Reevaluation: After the interventions noted  above, I reevaluated the patient and found that they have :{resolved/improved/worsened:23923::"improved"}  Complexity of problems addressed: Patient's presentation is most consistent with  {WGNF:62130}  Disposition: After consideration of the diagnostic results and the patient's response to treatment,  I feel that the patent would benefit from {disposition:26850}.     {Document critical care time when appropriate:1} {Document review of labs and clinical decision tools ie heart score, Chads2Vasc2 etc:1}  {Document your independent review of radiology images, and any outside records:1} {Document your discussion with family members, caretakers, and with consultants:1} {Document social determinants of health affecting pt's care:1} {Document your decision making why or why not admission, treatments were needed:1} Final Clinical Impression(s) / ED Diagnoses Final diagnoses:  None    Rx / DC Orders ED Discharge Orders     None

## 2022-01-29 NOTE — ED Triage Notes (Signed)
Patient presents to ED via [facility] from [facility].  Patient reports chest pain radiating to left arm '@1900'$ . Patient c/o shortness of breath and dizziness today. Patient has a history CABG and esophageal cancer. EMS obtained CBG 110 . Patient has history of renal failure and goes to dialysis Monday Wed and Friday

## 2022-01-30 DIAGNOSIS — R55 Syncope and collapse: Secondary | ICD-10-CM | POA: Diagnosis present

## 2022-01-30 DIAGNOSIS — R7989 Other specified abnormal findings of blood chemistry: Secondary | ICD-10-CM | POA: Diagnosis not present

## 2022-01-30 DIAGNOSIS — E1122 Type 2 diabetes mellitus with diabetic chronic kidney disease: Secondary | ICD-10-CM | POA: Diagnosis not present

## 2022-01-30 DIAGNOSIS — I25118 Atherosclerotic heart disease of native coronary artery with other forms of angina pectoris: Secondary | ICD-10-CM | POA: Diagnosis not present

## 2022-01-30 DIAGNOSIS — I5022 Chronic systolic (congestive) heart failure: Secondary | ICD-10-CM

## 2022-01-30 DIAGNOSIS — I509 Heart failure, unspecified: Secondary | ICD-10-CM

## 2022-01-30 DIAGNOSIS — I472 Ventricular tachycardia, unspecified: Secondary | ICD-10-CM | POA: Diagnosis not present

## 2022-01-30 DIAGNOSIS — F32A Depression, unspecified: Secondary | ICD-10-CM

## 2022-01-30 DIAGNOSIS — K2270 Barrett's esophagus without dysplasia: Secondary | ICD-10-CM | POA: Diagnosis not present

## 2022-01-30 DIAGNOSIS — I10 Essential (primary) hypertension: Secondary | ICD-10-CM

## 2022-01-30 DIAGNOSIS — E440 Moderate protein-calorie malnutrition: Secondary | ICD-10-CM | POA: Diagnosis not present

## 2022-01-30 DIAGNOSIS — K219 Gastro-esophageal reflux disease without esophagitis: Secondary | ICD-10-CM | POA: Diagnosis not present

## 2022-01-30 DIAGNOSIS — I252 Old myocardial infarction: Secondary | ICD-10-CM | POA: Diagnosis not present

## 2022-01-30 DIAGNOSIS — E782 Mixed hyperlipidemia: Secondary | ICD-10-CM

## 2022-01-30 DIAGNOSIS — N186 End stage renal disease: Secondary | ICD-10-CM | POA: Diagnosis not present

## 2022-01-30 DIAGNOSIS — Z794 Long term (current) use of insulin: Secondary | ICD-10-CM | POA: Diagnosis not present

## 2022-01-30 DIAGNOSIS — R0789 Other chest pain: Secondary | ICD-10-CM | POA: Diagnosis present

## 2022-01-30 DIAGNOSIS — R079 Chest pain, unspecified: Secondary | ICD-10-CM | POA: Diagnosis present

## 2022-01-30 DIAGNOSIS — I5042 Chronic combined systolic (congestive) and diastolic (congestive) heart failure: Secondary | ICD-10-CM | POA: Diagnosis not present

## 2022-01-30 DIAGNOSIS — I25708 Atherosclerosis of coronary artery bypass graft(s), unspecified, with other forms of angina pectoris: Secondary | ICD-10-CM

## 2022-01-30 DIAGNOSIS — K5900 Constipation, unspecified: Secondary | ICD-10-CM | POA: Diagnosis not present

## 2022-01-30 DIAGNOSIS — Z6825 Body mass index (BMI) 25.0-25.9, adult: Secondary | ICD-10-CM | POA: Diagnosis not present

## 2022-01-30 DIAGNOSIS — I471 Supraventricular tachycardia: Secondary | ICD-10-CM | POA: Diagnosis not present

## 2022-01-30 DIAGNOSIS — Z66 Do not resuscitate: Secondary | ICD-10-CM | POA: Diagnosis not present

## 2022-01-30 DIAGNOSIS — Z992 Dependence on renal dialysis: Secondary | ICD-10-CM | POA: Diagnosis not present

## 2022-01-30 DIAGNOSIS — D631 Anemia in chronic kidney disease: Secondary | ICD-10-CM | POA: Diagnosis not present

## 2022-01-30 DIAGNOSIS — Z8501 Personal history of malignant neoplasm of esophagus: Secondary | ICD-10-CM | POA: Diagnosis not present

## 2022-01-30 DIAGNOSIS — J9811 Atelectasis: Secondary | ICD-10-CM | POA: Diagnosis not present

## 2022-01-30 DIAGNOSIS — I132 Hypertensive heart and chronic kidney disease with heart failure and with stage 5 chronic kidney disease, or end stage renal disease: Secondary | ICD-10-CM | POA: Diagnosis not present

## 2022-01-30 DIAGNOSIS — E8889 Other specified metabolic disorders: Secondary | ICD-10-CM | POA: Diagnosis not present

## 2022-01-30 HISTORY — DX: Other chest pain: R07.89

## 2022-01-30 LAB — COMPREHENSIVE METABOLIC PANEL
ALT: 22 U/L (ref 0–44)
AST: 17 U/L (ref 15–41)
Albumin: 3.3 g/dL — ABNORMAL LOW (ref 3.5–5.0)
Alkaline Phosphatase: 149 U/L — ABNORMAL HIGH (ref 38–126)
Anion gap: 9 (ref 5–15)
BUN: 48 mg/dL — ABNORMAL HIGH (ref 8–23)
CO2: 23 mmol/L (ref 22–32)
Calcium: 8.3 mg/dL — ABNORMAL LOW (ref 8.9–10.3)
Chloride: 104 mmol/L (ref 98–111)
Creatinine, Ser: 4.19 mg/dL — ABNORMAL HIGH (ref 0.61–1.24)
GFR, Estimated: 15 mL/min — ABNORMAL LOW (ref >60)
Glucose, Bld: 245 mg/dL — ABNORMAL HIGH (ref 70–99)
Potassium: 4.1 mmol/L (ref 3.5–5.1)
Sodium: 136 mmol/L (ref 135–145)
Total Bilirubin: 0.4 mg/dL (ref 0.3–1.2)
Total Protein: 6.3 g/dL — ABNORMAL LOW (ref 6.5–8.1)

## 2022-01-30 LAB — CBC
HCT: 31.3 % — ABNORMAL LOW (ref 39.0–52.0)
Hemoglobin: 9.7 g/dL — ABNORMAL LOW (ref 13.0–17.0)
MCH: 26.9 pg (ref 26.0–34.0)
MCHC: 31 g/dL (ref 30.0–36.0)
MCV: 86.7 fL (ref 80.0–100.0)
Platelets: 210 10*3/uL (ref 150–400)
RBC: 3.61 MIL/uL — ABNORMAL LOW (ref 4.22–5.81)
RDW: 16.7 % — ABNORMAL HIGH (ref 11.5–15.5)
WBC: 8.2 10*3/uL (ref 4.0–10.5)
nRBC: 0 % (ref 0.0–0.2)

## 2022-01-30 LAB — CBG MONITORING, ED
Glucose-Capillary: 208 mg/dL — ABNORMAL HIGH (ref 70–99)
Glucose-Capillary: 208 mg/dL — ABNORMAL HIGH (ref 70–99)
Glucose-Capillary: 277 mg/dL — ABNORMAL HIGH (ref 70–99)

## 2022-01-30 LAB — MAGNESIUM: Magnesium: 2 mg/dL (ref 1.7–2.4)

## 2022-01-30 LAB — LIPID PANEL
Cholesterol: 98 mg/dL (ref 0–200)
HDL: 56 mg/dL (ref >40)
LDL Cholesterol: 32 mg/dL (ref 0–99)
Total CHOL/HDL Ratio: 1.8 RATIO
Triglycerides: 48 mg/dL (ref <150)
VLDL: 10 mg/dL (ref 0–40)

## 2022-01-30 LAB — TROPONIN I (HIGH SENSITIVITY)
Troponin I (High Sensitivity): 31 ng/L — ABNORMAL HIGH (ref <18)
Troponin I (High Sensitivity): 32 ng/L — ABNORMAL HIGH (ref <18)

## 2022-01-30 MED ORDER — TRAMADOL HCL 50 MG PO TABS
25.0000 mg | ORAL_TABLET | Freq: Two times a day (BID) | ORAL | Status: DC | PRN
  Administered 2022-01-30 – 2022-02-02 (×5): 25 mg via ORAL
  Filled 2022-01-30 (×5): qty 1

## 2022-01-30 MED ORDER — OXYCODONE-ACETAMINOPHEN 5-325 MG PO TABS
1.0000 | ORAL_TABLET | Freq: Three times a day (TID) | ORAL | Status: DC | PRN
  Administered 2022-02-02: 1 via ORAL
  Filled 2022-01-30: qty 1

## 2022-01-30 MED ORDER — CHLORHEXIDINE GLUCONATE CLOTH 2 % EX PADS
6.0000 | MEDICATED_PAD | Freq: Every day | CUTANEOUS | Status: DC
  Administered 2022-01-31 – 2022-02-04 (×5): 6 via TOPICAL

## 2022-01-30 MED ORDER — ACETAMINOPHEN 650 MG RE SUPP: 650.0000 mg | Freq: Four times a day (QID) | RECTAL | Status: DC | PRN

## 2022-01-30 MED ORDER — CALCITRIOL 0.25 MCG PO CAPS
0.2500 ug | ORAL_CAPSULE | ORAL | Status: DC
Start: 2022-01-31 — End: 2022-02-05
  Administered 2022-01-31 – 2022-02-04 (×3): 0.25 ug via ORAL
  Filled 2022-01-30 (×3): qty 1

## 2022-01-30 MED ORDER — OXYCODONE-ACETAMINOPHEN 5-325 MG PO TABS: 1.0000 | ORAL_TABLET | Freq: Four times a day (QID) | ORAL | Status: DC | PRN

## 2022-01-30 MED ORDER — ACETAMINOPHEN 325 MG PO TABS
650.0000 mg | ORAL_TABLET | Freq: Four times a day (QID) | ORAL | Status: DC | PRN
  Administered 2022-02-03: 650 mg via ORAL
  Filled 2022-01-30: qty 2

## 2022-01-30 MED ORDER — ONDANSETRON HCL 4 MG/2ML IJ SOLN
4.0000 mg | Freq: Four times a day (QID) | INTRAMUSCULAR | Status: DC | PRN
  Administered 2022-02-03: 4 mg via INTRAVENOUS
  Filled 2022-01-30: qty 2

## 2022-01-30 MED ORDER — ONDANSETRON HCL 4 MG PO TABS: 4.0000 mg | ORAL_TABLET | Freq: Four times a day (QID) | ORAL | Status: DC | PRN

## 2022-01-30 MED ORDER — HYDROXYZINE HCL 25 MG PO TABS
25.0000 mg | ORAL_TABLET | Freq: Three times a day (TID) | ORAL | Status: DC | PRN
  Administered 2022-01-30: 25 mg via ORAL
  Filled 2022-01-30: qty 1

## 2022-01-30 MED ORDER — SERTRALINE HCL 50 MG PO TABS
150.0000 mg | ORAL_TABLET | Freq: Every day | ORAL | Status: DC
  Administered 2022-01-30 – 2022-02-03 (×5): 150 mg via ORAL
  Filled 2022-01-30 (×5): qty 1

## 2022-01-30 MED ORDER — HYDROXYZINE PAMOATE 25 MG PO CAPS: 25.0000 mg | ORAL_CAPSULE | Freq: Three times a day (TID) | ORAL | Status: DC | PRN

## 2022-01-30 MED ORDER — CALCIUM ACETATE (PHOS BINDER) 667 MG PO CAPS
667.0000 mg | ORAL_CAPSULE | Freq: Three times a day (TID) | ORAL | Status: DC
  Administered 2022-01-30 – 2022-01-31 (×5): 667 mg via ORAL
  Filled 2022-01-30 (×5): qty 1

## 2022-01-30 MED ORDER — PANTOPRAZOLE SODIUM 40 MG PO TBEC
40.0000 mg | DELAYED_RELEASE_TABLET | Freq: Every day | ORAL | Status: DC
  Administered 2022-01-30 – 2022-02-04 (×6): 40 mg via ORAL
  Filled 2022-01-30 (×6): qty 1

## 2022-01-30 MED ORDER — DOXAZOSIN MESYLATE 4 MG PO TABS
4.0000 mg | ORAL_TABLET | Freq: Every day | ORAL | Status: DC
  Administered 2022-01-30 – 2022-02-04 (×6): 4 mg via ORAL
  Filled 2022-01-30 (×6): qty 1

## 2022-01-30 MED ORDER — SERTRALINE HCL 50 MG PO TABS
100.0000 mg | ORAL_TABLET | Freq: Every day | ORAL | Status: DC
Start: 2022-01-30 — End: 2022-01-30

## 2022-01-30 MED ORDER — HYDRALAZINE HCL 25 MG PO TABS
100.0000 mg | ORAL_TABLET | Freq: Three times a day (TID) | ORAL | Status: DC
  Administered 2022-01-30 – 2022-02-04 (×15): 100 mg via ORAL
  Filled 2022-01-30 (×16): qty 4

## 2022-01-30 MED ORDER — MORPHINE SULFATE (PF) 2 MG/ML IV SOLN: 2.0000 mg | INTRAVENOUS | Status: DC | PRN

## 2022-01-30 MED ORDER — CARVEDILOL 12.5 MG PO TABS
12.5000 mg | ORAL_TABLET | Freq: Two times a day (BID) | ORAL | Status: DC
Start: 2022-01-30 — End: 2022-02-02
  Administered 2022-01-30 – 2022-02-02 (×7): 12.5 mg via ORAL
  Filled 2022-01-30 (×7): qty 1

## 2022-01-30 MED ORDER — HEPARIN SODIUM (PORCINE) 5000 UNIT/ML IJ SOLN
5000.0000 [IU] | Freq: Three times a day (TID) | INTRAMUSCULAR | Status: DC
  Administered 2022-01-30 – 2022-02-01 (×6): 5000 [IU] via SUBCUTANEOUS
  Filled 2022-01-30 (×6): qty 1

## 2022-01-30 MED ORDER — ISOSORBIDE MONONITRATE ER 30 MG PO TB24
15.0000 mg | ORAL_TABLET | Freq: Every day | ORAL | Status: DC
  Administered 2022-01-30: 15 mg via ORAL
  Filled 2022-01-30: qty 1

## 2022-01-30 MED ORDER — SUCRALFATE 1 GM/10ML PO SUSP
1.0000 g | Freq: Three times a day (TID) | ORAL | Status: DC
  Administered 2022-01-30 – 2022-01-31 (×4): 1 g via ORAL
  Filled 2022-01-30 (×4): qty 10

## 2022-01-30 NOTE — Assessment & Plan Note (Signed)
-   BNP 1906 - Chest x-ray shows cardiomegaly, but no pulmonary edema - No recent weight gain - Peripheral edema is about at his baseline - Not currently in exacerbation - Euvolemia via dialysis - New Imdur and Coreg

## 2022-01-30 NOTE — Assessment & Plan Note (Signed)
Continue Zoloft 

## 2022-01-30 NOTE — Assessment & Plan Note (Signed)
-   Resume Repatha at discharge

## 2022-01-30 NOTE — TOC Progression Note (Signed)
  Transition of Care [facility]) Screening Note   Patient Details  Name: KYROS SALZWEDEL Date of Birth: 1955-11-15   Transition of Care [facility]) CM/SW Contact:    Boneta Lucks, RN Phone Number: 01/30/2022, 2:08 PM  IN ED - Transfer to [facility].  Transition of Care Department [facility]) has reviewed patient and no TOC needs have been identified at this time. We will continue to monitor patient advancement through interdisciplinary progression rounds. If new patient transition needs arise, please place a TOC consult.       Barriers to Discharge: Continued Medical Work up

## 2022-01-30 NOTE — Plan of Care (Signed)

## 2022-01-30 NOTE — Progress Notes (Signed)
Patient seen and examined; admitted after midnight secondary to chest pain.  Heart score of 5, with risk factors including hypertension, type 2 diabetes mellitus with nephropathy, hyperlipidemia and prior history of CABG. Of note, patient with hx of chronic renal failure stage V on HD M-W-F and status post AV fistula placement.  Flat troponin elevation appreciated; EKG without acute ischemic changes.  Typical features receive as part of his history and pain description; pain improvement after receiving nitroglycerin.  Case discussed with cardiology service who has recommended transfer to [facility] for further evaluation and management.  Please refer to H&P written by Dr. Clearence Ped on 01/30/22 for further info/details on admission.  Plan: -Continue aspirin, beta-blocker and Imdur  -telemetry monitoring  -nephrology and cardiology service aware of patient transfer. -follow clinical response and cardiology service recommendations.  Barton Dubois MD [phone]

## 2022-01-30 NOTE — Assessment & Plan Note (Signed)
Continue Protonix °

## 2022-01-30 NOTE — Consult Note (Signed)
Renal Service Consult Note [facility]  [name] 01/30/2022 [name], MD Requesting Physician: Dr. Dyann Kief  Reason for Consult: ESRD pt w/ chest pain HPI: The patient is a 66 y.o. year-old w/ hx of anemia, CAD sp CABG, esoph cancer (2022), HFrEF, depression, HTN, OSA, h/o CVA, DM2 and ESRD on HD MWF, started HD mid July, who presented to [facility] ED w/ c/o chest pain radiating to L arm w/ SOB and dizziness on 8/19. Pt resides in a SNF. Rec'd SL NTG at SNF and then in ED at [facility] w/ improvement. Hx MI in the past. Also has been constipated. Trop 30, 31. EKG w/o acute changes. CXR w/o edema, small R effusion. Pt is planned to admit for CP observation admit at [facility]. Asked to see for ESRD.    Pt seen in room. On HD for about 4 wks now. Using TDC, L arm AVF maturing. No current c/o's. Goes to HD in [place], lives by himself. Hx CABG here 1-2 yrs ago. No tob/ etoh. Loves going to [place].   ROS - denies CP, no joint pain, no HA, no blurry vision, no rash, no diarrhea, no nausea/ vomiting, no dysuria, no difficulty voiding   Past Medical History  Past Medical History:  Diagnosis Date   Anemia    Arthritis    hips shoulders   CAD (coronary artery disease) 03/03/2019   Cancer ([place])    Esophageal cancer 2022   CHF (congestive heart failure) ([place]) 2020   Chronic combined systolic and diastolic heart failure ([place]) 04/26/2018   Admitted 11/14-11/20/19-diuresed 10L   Depression 05/31/2021   Fatigue 10/14/2020   Hypertension    Myocardial infarction [facility])    Sleep apnea    uses a bipap machine   Stroke ([place]) 12/14/2020   no last weakness or paralysis   Type 2 diabetes mellitus with diabetic nephropathy ([place]) 04/23/2019   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   Past Surgical History  Past Surgical History:  Procedure Laterality Date   AV FISTULA PLACEMENT Left 12/21/2021   Procedure: LEFT ARM ARTERIOVENOUS (AV) FISTULA CREATION;  Surgeon: Rosetta Posner, MD;  [facility]:  [facility];  Service: Vascular;  Laterality: Left;   BIOPSY  06/27/2019   Procedure: BIOPSY;  Surgeon: Rogene Houston, MD;  [facility];  Service: Endoscopy;;  ascending colon polyp   BIOPSY  10/21/2020   Procedure: BIOPSY;  Surgeon: Rogene Houston, MD;  [facility];  Service: Endoscopy;;  esophageal,gastric polyp   BIOPSY  02/24/2021   Procedure: BIOPSY;  Surgeon: Rogene Houston, MD;  [facility];  Service: Endoscopy;;  distal and proximal esophageal biopsies    CARDIAC SURGERY     CATARACT EXTRACTION     COLONOSCOPY N/A 06/27/2019   [name]:Diverticulosis in the entire examined colon. tubular adenoma in ascending colon, 12m tubular adenoma in prox sigmoid. external hemorrhoids   CORONARY ARTERY BYPASS GRAFT N/A 03/25/2019   Procedure: CORONARY ARTERY BYPASS GRAFTING (CABG) X  4 USING LEFT INTERNAL MAMMARY ARTERY AND RIGHT SAPHENOUS VEIN GRAFTS;  Surgeon: LLajuana Matte MD;  [place]  Service: Open Heart Surgery;  Laterality: N/A;   ESOPHAGOGASTRODUODENOSCOPY (EGD) WITH PROPOFOL N/A 10/21/2020   rehman:Normal hypopharynx.Normal proximal esophagus and mid esophagus.Esophageal mucosal changes consistent with long-segment Barrett's esophagus. (Focal low-grade dysplasia and atypia proximally) z line irregular 37 cm from incisors, 3 cm HH, single gastric polyp (fundic gland) normal duodenal bulb/second portion of duodenum, proximal margin  of Barrett's at 34 cm   ESOPHAGOGASTRODUODENOSCOPY (EGD) WITH PROPOFOL N/A 02/24/2021   Procedure: ESOPHAGOGASTRODUODENOSCOPY (EGD) WITH PROPOFOL;  Surgeon: Rogene Houston, MD;  [facility];  Service: Endoscopy;  Laterality: N/A;  1:35   ESOPHAGOGASTRODUODENOSCOPY (EGD) WITH PROPOFOL N/A 03/21/2021   Procedure: ESOPHAGOGASTRODUODENOSCOPY (EGD) WITH PROPOFOL;  Surgeon: Carol Ada, MD;  Location: [place];  Service: Endoscopy;  Laterality: N/A;   ESOPHAGOGASTRODUODENOSCOPY (EGD) WITH PROPOFOL N/A  10/23/2021   Procedure: ESOPHAGOGASTRODUODENOSCOPY (EGD) WITH PROPOFOL;  Surgeon: Doran Stabler, MD;  Location: [place];  Service: Gastroenterology;  Laterality: N/A;   EXCISION MORTON'S NEUROMA     EYE SURGERY Left    retina   INCISION AND DRAINAGE OF WOUND Right 06/28/2021   Procedure: IRRIGATION AND DEBRIDEMENT WOUND;  Surgeon: [name], MD;  Location: [place];  Service: Plastics;  Laterality: Right;  1.5 hour   INSERTION OF DIALYSIS CATHETER Right 12/21/2021   Procedure: INSERTION OF TUNNELED DIALYSIS CATHETER;  Surgeon: Rosetta Posner, MD;  [facility];  Service: Vascular;  Laterality: Right;   POLYPECTOMY  06/27/2019   Procedure: POLYPECTOMY;  Surgeon: Rogene Houston, MD;  [facility];  Service: Endoscopy;;  proximal sigmoid colon   RIGHT/LEFT HEART CATH AND CORONARY ANGIOGRAPHY N/A 03/12/2019   Procedure: RIGHT/LEFT HEART CATH AND CORONARY ANGIOGRAPHY;  Surgeon: [name], MD;  Location: [place] [facility];  Service: Cardiovascular;  Laterality: N/A;   SHOULDER ARTHROSCOPY WITH ROTATOR CUFF REPAIR AND SUBACROMIAL DECOMPRESSION Right 08/26/2020   Procedure: RIGHT SHOULDER MINI OPEN ROTATOR CUFF REPAIR AND SUBACROMIAL DECOMPRESSION WITH PATCH GRAFT;  Surgeon: Susa Day, MD;  Location: [facility];  Service: Orthopedics;  Laterality: Right;  90 MINS GENERAL WITH BLOCK   SKIN SPLIT GRAFT Right 06/28/2021   Procedure: SKIN GRAFT SPLIT THICKNESS;  Surgeon: [name], MD;  Location: [place];  Service: Plastics;  Laterality: Right;   TEE WITHOUT CARDIOVERSION N/A 03/25/2019   Procedure: TRANSESOPHAGEAL ECHOCARDIOGRAM (TEE);  Surgeon: [name], MD;  Location: [place];  Service: Open Heart Surgery;  Laterality: N/A;   Family History  Family History  Problem Relation Age of Onset   Colon cancer Mother    Heart Problems Father    Diabetes Father    Valvular heart disease Father    Sleep apnea Neg Hx    Stroke Neg Hx    Social History   reports that he quit smoking about 33 years ago. His smoking use included cigarettes. He has never used smokeless tobacco. He reports that he does not currently use alcohol after a past usage of about 2.0 standard drinks of alcohol per week. He reports that he does not use drugs. Allergies  Allergies  Allergen Reactions   Bee Venom Anaphylaxis   Atorvastatin     myalgia   Diltiazem Itching   Reglan [Metoclopramide] Other (See Comments)    Suicidal    Rosuvastatin     myalgias   Valsartan Itching   Home medications Prior to Admission medications   Medication Sig Start Date End Date Taking? Authorizing Provider  acetaminophen (TYLENOL) 325 MG tablet Take 2 tablets (650 mg total) by mouth every 6 (six) hours as needed for mild pain, moderate pain or fever. 08/02/21   Allie Bossier, MD  albuterol (VENTOLIN HFA) 108 (90 Base) MCG/ACT inhaler Inhale 2 puffs into the lungs every 6 (six) hours as needed. Patient taking differently: Inhale 2 puffs into the lungs every 6 (six) hours  as needed for wheezing or shortness of breath. 06/24/21   Coral Spikes, DO  ascorbic acid (VITAMIN C) 500 MG tablet Take 1 tablet (500 mg total) by mouth daily. 08/03/21   Allie Bossier, MD  calcium acetate (PHOSLO) 667 MG capsule Take 667 mg by mouth in the morning, at noon, and at bedtime.    [provider]  calcium-vitamin D (OSCAL WITH D) 500-5 MG-MCG tablet Take 1 tablet by mouth 2 (two) times daily.    [provider]  carvedilol (COREG) 12.5 MG tablet Take 1 tablet (12.5 mg total) by mouth 2 (two) times daily. 09/14/21   Loel Dubonnet, NP  cholecalciferol (VITAMIN D) 25 MCG (1000 UNIT) tablet Take 1,000 Units by mouth daily.    [provider]  doxazosin (CARDURA) 4 MG tablet Take 1 tablet (4 mg total) by mouth daily. 09/14/21   Loel Dubonnet, NP  EPINEPHrine 0.3 mg/0.3 mL IJ SOAJ injection Inject 0.3 mg into the muscle as needed for anaphylaxis. 01/25/21   [name], MD   ferrous sulfate (FEROSUL) 325 (65 FE) MG tablet Take 325 mg by mouth daily.    [provider]  folic acid (FOLVITE) 951 MCG tablet Take 800 mcg by mouth daily.    [provider]  Glucerna (GLUCERNA) LIQD Take 237 mLs by mouth 2 (two) times daily.    [provider]  hydrALAZINE (APRESOLINE) 100 MG tablet Take 1 tablet (100 mg total) by mouth 3 (three) times daily. 11/01/21   Loel Dubonnet, NP  hydrOXYzine (VISTARIL) 25 MG capsule TAKE ONE CAPSULE BY MOUTH NIGHTLY AS NEEDED FOR ALLERGIES AND INSOMNIA 11/01/21   [name], MD  Insulin Pen Needle (B-D ULTRAFINE III SHORT PEN) 31G X 8 MM MISC 1 each by Does not apply route as directed. 05/30/19   Cassandria Anger, MD  isosorbide mononitrate (IMDUR) 30 MG 24 hr tablet Take 0.5 tablets (15 mg total) by mouth daily. 10/27/21   Domenic Polite, MD  lidocaine (ASPERCREME LIDOCAINE) 4 % Place 1 patch onto the skin daily. Wear for 12 hours then remove, wait 12 hours before applying a new patch to lower back    [provider]  Multiple Vitamins-Minerals (THERATRUM COMPLETE) TABS Take 1 tablet by mouth daily.    [provider]  nitroGLYCERIN (NITROSTAT) 0.4 MG SL tablet Place 1 tablet (0.4 mg total) under the tongue every 5 (five) minutes as needed for chest pain. 09/14/21   Loel Dubonnet, NP  omeprazole (PRILOSEC) 40 MG capsule Take 40 mg by mouth in the morning and at bedtime.    [provider]  ondansetron (ZOFRAN) 4 MG tablet Take 4 mg by mouth every 8 (eight) hours as needed for nausea or vomiting.    [provider]  oxyCODONE-acetaminophen (PERCOCET) 5-325 MG tablet Take 1 tablet by mouth every 6 (six) hours as needed for severe pain. 12/21/21   Rosetta Posner, MD  Polyethylene Glycol 3350 POWD Take 17 g by mouth daily. Mix in 240 mL of fluid    [provider]  REPATHA SURECLICK 884 MG/ML SOAJ Inject 140 mg as directed every 14 (fourteen) days. 09/14/21   Loel Dubonnet, NP  sertraline (ZOLOFT) 100 MG tablet Take 1 and half tablet  by mouth daily 09/20/21   [name], MD  sucralfate (CARAFATE) 1 GM/10ML suspension Take 10 mLs (1 g total) by mouth 3 (three) times daily before meals. 10/26/21  Domenic Polite, MD  torsemide (DEMADEX) 20 MG tablet Take 40 mg by mouth daily. 10/11/21   [provider]  traMADol (ULTRAM) 50 MG tablet Take 0.5 tablets (25 mg total) by mouth every 12 (twelve) hours as needed for moderate pain. 10/28/21   Arrien, Jimmy Picket, MD  vitamin B-12 (CYANOCOBALAMIN) 1000 MCG tablet Take 1,000 mcg by mouth daily.    [provider]     Vitals:   01/30/22 0600 01/30/22 0630 01/30/22 0645 01/30/22 0830  BP: (!) 178/75 (!) 178/80  (!) 185/80  Pulse:  86    Resp:  (!) '26 19 17  '$ Temp:      TempSrc:      SpO2:  96%     Exam Gen alert, no distress No rash, cyanosis or gangrene Sclera anicteric, throat clear  No jvd or bruits Chest clear bilat to bases, no rales/ wheezing RRR no MRG Abd soft ntnd no mass or ascites +bs GU normal MS no joint effusions or deformity Ext no LE or UE edema, no wounds or ulcers Neuro is alert, Ox 3 , nf    RIJ TDC/ L BC AVF    Home meds include - albuterol, calcium acetate 1 ac, carvedilol 12.5 bid, doxazosin 4 qd, hydralazine 100 tid, isosorbide mononitrate 15 qd, MVI, sl ntg, omeprazole, oxycodone-acetaminophen, repatha q 14d, sertraline, sucralfate 1 gm tid, torsemide 40 qd, tramadol, vits/ supps/ prns   OP HD: MWF [facility] 4h  300/600  80.5kg  3k/2.5Ca bath  Heparin none  RIJ TDC (maturing LUA AVF created on 12/21/21) - last hep B labs: none - mircera 30 ug q2 , last 8/18, due early Sept - iron sucrose '100mg'$  q hd IV thru 8/21 (finished) - calcitriol 0.25 ug tiw po - last HD 8/18, post wt 80.3   BNP 1906, CXR no edema, K 4.1 Creat 4.2 BUN 48  alb 3.3  trop 31, 32    WBC 8k  Hb 9.7     Assessment/ Plan: Chest pain/ hx CAD sp CABG - troponins are not high.  Admit for chest pain observation.  ESRD - on HD MWF. Has not missed HD. Next HD tomorrow.  HTN/ vol - BP's up a bit, up 4 kg by wts, max UF w/ HD tomorrow. Cont home BP meds per cards/ pmd.  Anemia esrd - Hb 9.7 here, just got OP esa not due til early Sept. SP OP IV fe load completed 8/21. Follow.  MBD ckd - CCa in range, add on phos and cont po vdra w hd.  Depression - SSRI GI - taking carafate which should not be used long-term in HD pts (alum toxicity).       [name] Splinter  MD 01/30/2022, 9:15 PM Recent Labs  Lab 01/29/22 2150 01/30/22 0522  HGB 8.8* 9.7*  ALBUMIN 3.4* 3.3*  CALCIUM 8.5* 8.3*  CREATININE 4.11* 4.19*  K 3.8 4.1   Inpatient medications:  calcium acetate  667 mg Oral TID WC   carvedilol  12.5 mg Oral BID   [START ON 01/31/2022] Chlorhexidine Gluconate Cloth  6 each Topical Q0600   doxazosin  4 mg Oral Daily   heparin  5,000 Units Subcutaneous Q8H   hydrALAZINE  100 mg Oral TID   isosorbide mononitrate  15 mg Oral Daily   pantoprazole  40 mg Oral Daily   sertraline  150 mg Oral QHS   sucralfate  1 g Oral TID AC    acetaminophen **OR** acetaminophen, hydrOXYzine, morphine injection,  ondansetron **OR** ondansetron (ZOFRAN) IV, oxyCODONE-acetaminophen, traMADol

## 2022-01-30 NOTE — Assessment & Plan Note (Signed)
-   Continue PhosLo - Hemodialysis is Monday Wednesday Friday - Epic secure chat sent to nephrology on-call to notify that the patient is coming to [place] to monitor

## 2022-01-30 NOTE — Assessment & Plan Note (Signed)
-   Chest pain, syncope, shortness of breath, diaphoresis -Relieved with 2 nitro and then returned -Again given 2 nitro and the pain is improved, but not resolved -BNP 1906 which is actually down from all of his other measurements this year -Troponin is at his baseline 30, 31 -EKG shows a heart rate 94, sinus rhythm, QTc 494, and is grossly unchanged from May 13 EKG -Chest x-ray shows small right pleural effusion and cardiomegaly -Patient was given 324 aspirin in the ED -Patient is high risk with history of CABG, HPI consistent with cardiac chest pain, and multiple comorbidities -Admitting for chest pain observation at [facility].  He will be evaluated by cardiology

## 2022-01-30 NOTE — ED Notes (Signed)
[facility] called at this time to setup transport and bed ready status.

## 2022-01-30 NOTE — H&P (Signed)
History and Physical    Patient: [name] MWN:[id number] DOB: [birth year] DOA: 01/29/2022 DOS: the patient was seen and examined on 01/30/2022 PCP: [name], MD  Patient coming from: SNF  Chief Complaint:  Chief Complaint  Patient presents with   Chest Pain   HPI: [name] is a 66 y.o. male with medical history significant of coronary artery disease, CHF, depression, hypertension, stroke, type 2 diabetes mellitus, and more presents ED with a chief complaint of chest pain.  Patient reports he was straining to have a bowel movement, and he thinks he gave himself a heart attack.  He describes chest pain that feels like a pressure in the center and left side of his chest.  He had associated diaphoresis, nausea, 1 episode of vomiting, shortness of breath.  The pain was nonradiating.  He had a reported syncopal episode, but was in his wheel chair so he did not have a fall.  He was given 2 nitro at the facility which improved the pain, but then it returned and he received 2 more nitro here in the ER.  His current pain is a 2 out of 10 on the pain scale.  Patient reports that it feels very similar to when he had a heart attack in the past, but not as intense.  On review of systems patient reports that he has been constipated.  He has started taking Senokot, but did not take 1 today.  He has no other acute complaints at this time.  Patient does not smoke, does not drink, does not use illicit drugs.  Vaccinated for [place].  He is DNR Review of Systems: As mentioned in the history of present illness. All other systems reviewed and are negative. Past Medical History:  Diagnosis Date   Anemia    Arthritis    hips shoulders   CAD (coronary artery disease) 03/03/2019   Cancer ([place])    Esophageal cancer 2022   CHF (congestive heart failure) ([place]) 2020   Chronic combined systolic and diastolic heart failure ([place]) 04/26/2018   Admitted 11/14-11/20/19-diuresed 10L   Depression 05/31/2021    Fatigue 10/14/2020   Hypertension    Myocardial infarction [facility])    Sleep apnea    uses a bipap machine   Stroke ([place]) 12/14/2020   no last weakness or paralysis   Type 2 diabetes mellitus with diabetic nephropathy ([place]) 04/23/2019   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   Past Surgical History:  Procedure Laterality Date   AV FISTULA PLACEMENT Left 12/21/2021   Procedure: LEFT ARM ARTERIOVENOUS (AV) FISTULA CREATION;  Surgeon: Rosetta Posner, MD;  Location: [facility];  Service: Vascular;  Laterality: Left;   BIOPSY  06/27/2019   Procedure: BIOPSY;  Surgeon: Rogene Houston, MD;  [facility];  Service: Endoscopy;;  ascending colon polyp   BIOPSY  10/21/2020   Procedure: BIOPSY;  Surgeon: Rogene Houston, MD;  [facility];  Service: Endoscopy;;  esophageal,gastric polyp   BIOPSY  02/24/2021   Procedure: BIOPSY;  Surgeon: Rogene Houston, MD;  [facility];  Service: Endoscopy;;  distal and proximal esophageal biopsies    CARDIAC SURGERY     CATARACT EXTRACTION     COLONOSCOPY N/A 06/27/2019   [name]:Diverticulosis in the entire examined colon. tubular adenoma in ascending colon, 87m tubular adenoma in prox sigmoid. external hemorrhoids   CORONARY ARTERY BYPASS GRAFT N/A 03/25/2019   Procedure: CORONARY ARTERY BYPASS GRAFTING (CABG) X  4  USING LEFT INTERNAL MAMMARY ARTERY AND RIGHT SAPHENOUS VEIN GRAFTS;  Surgeon: [name], MD;  Location: [place];  Service: Open Heart Surgery;  Laterality: N/A;   ESOPHAGOGASTRODUODENOSCOPY (EGD) WITH PROPOFOL N/A 10/21/2020   rehman:Normal hypopharynx.Normal proximal esophagus and mid esophagus.Esophageal mucosal changes consistent with long-segment Barrett's esophagus. (Focal low-grade dysplasia and atypia proximally) z line irregular 37 cm from incisors, 3 cm HH, single gastric polyp (fundic gland) normal duodenal bulb/second portion of duodenum, proximal margin of Barrett's at 34 cm   ESOPHAGOGASTRODUODENOSCOPY  (EGD) WITH PROPOFOL N/A 02/24/2021   Procedure: ESOPHAGOGASTRODUODENOSCOPY (EGD) WITH PROPOFOL;  Surgeon: Rogene Houston, MD;  [facility];  Service: Endoscopy;  Laterality: N/A;  1:35   ESOPHAGOGASTRODUODENOSCOPY (EGD) WITH PROPOFOL N/A 03/21/2021   Procedure: ESOPHAGOGASTRODUODENOSCOPY (EGD) WITH PROPOFOL;  Surgeon: Carol Ada, MD;  Location: [place];  Service: Endoscopy;  Laterality: N/A;   ESOPHAGOGASTRODUODENOSCOPY (EGD) WITH PROPOFOL N/A 10/23/2021   Procedure: ESOPHAGOGASTRODUODENOSCOPY (EGD) WITH PROPOFOL;  Surgeon: Doran Stabler, MD;  Location: [place];  Service: Gastroenterology;  Laterality: N/A;   EXCISION MORTON'S NEUROMA     EYE SURGERY Left    retina   INCISION AND DRAINAGE OF WOUND Right 06/28/2021   Procedure: IRRIGATION AND DEBRIDEMENT WOUND;  Surgeon: [name], MD;  Location: [place];  Service: Plastics;  Laterality: Right;  1.5 hour   INSERTION OF DIALYSIS CATHETER Right 12/21/2021   Procedure: INSERTION OF TUNNELED DIALYSIS CATHETER;  Surgeon: Rosetta Posner, MD;  Location[facility];  Service: Vascular;  Laterality: Right;   POLYPECTOMY  06/27/2019   Procedure: POLYPECTOMY;  Surgeon: Rogene Houston, MD;  [facility];  Service: Endoscopy;;  proximal sigmoid colon   RIGHT/LEFT HEART CATH AND CORONARY ANGIOGRAPHY N/A 03/12/2019   Procedure: RIGHT/LEFT HEART CATH AND CORONARY ANGIOGRAPHY;  Surgeon: [name], MD;  Location: [place] CV [facility];  Service: Cardiovascular;  Laterality: N/A;   SHOULDER ARTHROSCOPY WITH ROTATOR CUFF REPAIR AND SUBACROMIAL DECOMPRESSION Right 08/26/2020   Procedure: RIGHT SHOULDER MINI OPEN ROTATOR CUFF REPAIR AND SUBACROMIAL DECOMPRESSION WITH PATCH GRAFT;  Surgeon: Susa Day, MD;  Location: [facility];  Service: Orthopedics;  Laterality: Right;  90 MINS GENERAL WITH BLOCK   SKIN SPLIT GRAFT Right 06/28/2021   Procedure: SKIN GRAFT SPLIT THICKNESS;  Surgeon: [name], MD;  Location: [place];   Service: Plastics;  Laterality: Right;   TEE WITHOUT CARDIOVERSION N/A 03/25/2019   Procedure: TRANSESOPHAGEAL ECHOCARDIOGRAM (TEE);  Surgeon: [name], MD;  Location: [place];  Service: Open Heart Surgery;  Laterality: N/A;   Social History:  reports that he quit smoking about 33 years ago. His smoking use included cigarettes. He has never used smokeless tobacco. He reports that he does not currently use alcohol after a past usage of about 2.0 standard drinks of alcohol per week. He reports that he does not use drugs.  Allergies  Allergen Reactions   Bee Venom Anaphylaxis   Atorvastatin     myalgia   Diltiazem Itching   Reglan [Metoclopramide] Other (See Comments)    Suicidal    Rosuvastatin     myalgias   Valsartan Itching    Family History  Problem Relation Age of Onset   Colon cancer Mother    Heart Problems Father    Diabetes Father    Valvular heart disease Father    Sleep apnea Neg Hx    Stroke Neg Hx     Prior to Admission medications  Medication Sig Start Date End Date Taking? Authorizing Provider  acetaminophen (TYLENOL) 325 MG tablet Take 2 tablets (650 mg total) by mouth every 6 (six) hours as needed for mild pain, moderate pain or fever. 08/02/21   Allie Bossier, MD  albuterol (VENTOLIN HFA) 108 (90 Base) MCG/ACT inhaler Inhale 2 puffs into the lungs every 6 (six) hours as needed. Patient taking differently: Inhale 2 puffs into the lungs every 6 (six) hours as needed for wheezing or shortness of breath. 06/24/21   Coral Spikes, DO  ascorbic acid (VITAMIN C) 500 MG tablet Take 1 tablet (500 mg total) by mouth daily. 08/03/21   Allie Bossier, MD  calcium acetate (PHOSLO) 667 MG capsule Take 667 mg by mouth in the morning, at noon, and at bedtime.    [provider]  calcium-vitamin D (OSCAL WITH D) 500-5 MG-MCG tablet Take 1 tablet by mouth 2 (two) times daily.    [provider]  carvedilol (COREG) 12.5 MG tablet Take 1 tablet (12.5 mg  total) by mouth 2 (two) times daily. 09/14/21   Loel Dubonnet, NP  cholecalciferol (VITAMIN D) 25 MCG (1000 UNIT) tablet Take 1,000 Units by mouth daily.    [provider]  doxazosin (CARDURA) 4 MG tablet Take 1 tablet (4 mg total) by mouth daily. 09/14/21   Loel Dubonnet, NP  EPINEPHrine 0.3 mg/0.3 mL IJ SOAJ injection Inject 0.3 mg into the muscle as needed for anaphylaxis. 01/25/21   [name], MD  ferrous sulfate (FEROSUL) 325 (65 FE) MG tablet Take 325 mg by mouth daily.    [provider]  folic acid (FOLVITE) 073 MCG tablet Take 800 mcg by mouth daily.    [provider]  Glucerna (GLUCERNA) LIQD Take 237 mLs by mouth 2 (two) times daily.    [provider]  hydrALAZINE (APRESOLINE) 100 MG tablet Take 1 tablet (100 mg total) by mouth 3 (three) times daily. 11/01/21   Loel Dubonnet, NP  hydrOXYzine (VISTARIL) 25 MG capsule TAKE ONE CAPSULE BY MOUTH NIGHTLY AS NEEDED FOR ALLERGIES AND INSOMNIA 11/01/21   [name], MD  Insulin Pen Needle (B-D ULTRAFINE III SHORT PEN) 31G X 8 MM MISC 1 each by Does not apply route as directed. 05/30/19   Cassandria Anger, MD  isosorbide mononitrate (IMDUR) 30 MG 24 hr tablet Take 0.5 tablets (15 mg total) by mouth daily. 10/27/21   Domenic Polite, MD  lidocaine (ASPERCREME LIDOCAINE) 4 % Place 1 patch onto the skin daily. Wear for 12 hours then remove, wait 12 hours before applying a new patch to lower back    [provider]  Multiple Vitamins-Minerals (THERATRUM COMPLETE) TABS Take 1 tablet by mouth daily.    [provider]  nitroGLYCERIN (NITROSTAT) 0.4 MG SL tablet Place 1 tablet (0.4 mg total) under the tongue every 5 (five) minutes as needed for chest pain. 09/14/21   Loel Dubonnet, NP  omeprazole (PRILOSEC) 40 MG capsule Take 40 mg by mouth in the morning and at bedtime.    [provider]  ondansetron (ZOFRAN) 4 MG tablet Take 4 mg by mouth every 8 (eight) hours as  needed for nausea or vomiting.    [provider]  oxyCODONE-acetaminophen (PERCOCET) 5-325 MG tablet Take 1 tablet by mouth every 6 (six) hours as needed for severe pain. 12/21/21   Rosetta Posner, MD  Polyethylene Glycol 3350 POWD Take 17 g by mouth daily. Mix in  240 mL of fluid    [provider]  REPATHA SURECLICK 829 MG/ML SOAJ Inject 140 mg as directed every 14 (fourteen) days. 09/14/21   Loel Dubonnet, NP  sertraline (ZOLOFT) 100 MG tablet Take 1 and half tablet  by mouth daily 09/20/21   [name], MD  sucralfate (CARAFATE) 1 GM/10ML suspension Take 10 mLs (1 g total) by mouth 3 (three) times daily before meals. 10/26/21   Domenic Polite, MD  torsemide (DEMADEX) 20 MG tablet Take 40 mg by mouth daily. 10/11/21   [provider]  traMADol (ULTRAM) 50 MG tablet Take 0.5 tablets (25 mg total) by mouth every 12 (twelve) hours as needed for moderate pain. 10/28/21   Arrien, Jimmy Picket, MD  vitamin B-12 (CYANOCOBALAMIN) 1000 MCG tablet Take 1,000 mcg by mouth daily.    [provider]    Physical Exam: Vitals:   01/30/22 0100 01/30/22 0130 01/30/22 0200 01/30/22 0230  BP: (!) 193/97 (!) 179/87 (!) 180/79 (!) 171/94  Pulse: 94 92 89 90  Resp: (!) '24 17 10 '$ (!) 26  Temp:      TempSrc:      SpO2: 94% 95% 96% 94%   1.  General: Patient lying supine in bed,  no acute distress   2. Psychiatric: Alert and oriented x 3, mood and behavior normal for situation, pleasant and cooperative with exam   3. Neurologic: Speech and language are normal, face is symmetric, moves all 4 extremities voluntarily, at baseline without acute deficits on limited exam   4. HEENMT:  Head is atraumatic, normocephalic, pupils reactive to light, neck is supple, trachea is midline, mucous membranes are moist   5. Respiratory : Lungs are clear to auscultation bilaterally without wheezing, rhonchi, rales, no cyanosis, no increase in work of breathing or accessory muscle use    6. Cardiovascular : Heart rate normal, rhythm is regular, murmur present, rubs or gallops, peripheral edema present, but only trace, peripheral pulses palpated   7. Gastrointestinal:  Abdomen is soft, nondistended, nontender to palpation bowel sounds active, no masses or organomegaly palpated   8. Skin:  Skin is warm, dry and intact without rashes, acute lesions, or ulcers on limited exam   9.Musculoskeletal:  No acute deformities or trauma, no asymmetry in tone, no peripheral edema, peripheral pulses palpated, no tenderness to palpation in the extremities  Data Reviewed: In the ED Temp 97.4, heart rate 93-97, respiratory rate 15-27, blood pressure 159/76-184/89, satting 94% White blood cell count 9.7, hemoglobin 8.8 Chemistry reveals an elevated BUN of 46, and creatinine 4.11 in this ESRD patient BNP is lower than it has been in our system, with the usually being in the 3000 and 4000, today is 1906 Trope 30, 31 flat Chest x-ray shows a small right pleural effusion with cardiomegaly EKG reveals a heart rate 94, sinus rhythm, QTc 494 Patient was given nitro, aspirin, Zofran in the ED Admission requested for chest pain rule out in this high risk patient Assessment and Plan: * Chest pain - Chest pain, syncope, shortness of breath, diaphoresis -Relieved with 2 nitro and then returned -Again given 2 nitro and the pain is improved, but not resolved -BNP 1906 which is actually down from all of his other measurements this year -Troponin is at his baseline 30, 31 -EKG shows a heart rate 94, sinus rhythm, QTc 494, and is grossly unchanged from May 13 EKG -Chest x-ray shows small right pleural effusion and cardiomegaly -Patient was given 324 aspirin in  the ED -Patient is high risk with history of CABG, HPI consistent with cardiac chest pain, and multiple comorbidities -Admitting for chest pain observation at [facility].  He will be evaluated by cardiology  Essential hypertension, benign -  Continue hydralazine, Coreg  Depression - Continue Zoloft  ESRD on dialysis [facility]) - Continue PhosLo - Hemodialysis is Monday Wednesday Friday - Epic secure chat sent to nephrology on-call to notify that the patient is coming to [place] to monitor  CHF (congestive heart failure) ([place]) - BNP 1906 - Chest x-ray shows cardiomegaly, but no pulmonary edema - No recent weight gain - Peripheral edema is about at his baseline - Not currently in exacerbation - Euvolemia via dialysis - New Imdur and Coreg  GERD (gastroesophageal reflux disease) - Continue Protonix  Coronary artery disease involving coronary bypass graft of native heart with other forms of angina pectoris ([place]) - See plan for chest pain - Continue Imdur and Coreg  Mixed hyperlipidemia - Resume Repatha at discharge      Advance Care Planning:   Code Status: DNR  Consults: Cardiology, nephrology  Family Communication: Wife at bedside  Severity of Illness: The appropriate patient status for this patient is OBSERVATION. Observation status is judged to be reasonable and necessary in order to provide the required intensity of service to ensure the patient's safety. The patient's presenting symptoms, physical exam findings, and initial radiographic and laboratory data in the context of their medical condition is felt to place them at decreased risk for further clinical deterioration. Furthermore, it is anticipated that the patient will be medically stable for discharge from the hospital within 2 midnights of admission.   Author: Rolla Plate, DO 01/30/2022 3:29 AM  For on call review [email or web address].

## 2022-01-30 NOTE — ED Notes (Signed)
Pt caregiver, [name], given update at this time

## 2022-01-30 NOTE — Assessment & Plan Note (Signed)
-   Continue hydralazine, Coreg

## 2022-01-30 NOTE — ED Notes (Signed)
Registration in room

## 2022-01-30 NOTE — Assessment & Plan Note (Signed)
-   See plan for chest pain - Continue Imdur and Coreg

## 2022-01-31 DIAGNOSIS — I25110 Atherosclerotic heart disease of native coronary artery with unstable angina pectoris: Secondary | ICD-10-CM | POA: Diagnosis not present

## 2022-01-31 DIAGNOSIS — I200 Unstable angina: Secondary | ICD-10-CM

## 2022-01-31 DIAGNOSIS — I252 Old myocardial infarction: Secondary | ICD-10-CM | POA: Diagnosis not present

## 2022-01-31 DIAGNOSIS — I471 Supraventricular tachycardia: Secondary | ICD-10-CM | POA: Diagnosis present

## 2022-01-31 DIAGNOSIS — R079 Chest pain, unspecified: Secondary | ICD-10-CM | POA: Diagnosis not present

## 2022-01-31 DIAGNOSIS — R072 Precordial pain: Secondary | ICD-10-CM | POA: Diagnosis not present

## 2022-01-31 DIAGNOSIS — Z794 Long term (current) use of insulin: Secondary | ICD-10-CM | POA: Diagnosis not present

## 2022-01-31 DIAGNOSIS — N186 End stage renal disease: Secondary | ICD-10-CM | POA: Diagnosis present

## 2022-01-31 DIAGNOSIS — E8889 Other specified metabolic disorders: Secondary | ICD-10-CM | POA: Diagnosis present

## 2022-01-31 DIAGNOSIS — D631 Anemia in chronic kidney disease: Secondary | ICD-10-CM | POA: Diagnosis present

## 2022-01-31 DIAGNOSIS — K2270 Barrett's esophagus without dysplasia: Secondary | ICD-10-CM | POA: Diagnosis present

## 2022-01-31 DIAGNOSIS — I2510 Atherosclerotic heart disease of native coronary artery without angina pectoris: Secondary | ICD-10-CM | POA: Diagnosis not present

## 2022-01-31 DIAGNOSIS — E1122 Type 2 diabetes mellitus with diabetic chronic kidney disease: Secondary | ICD-10-CM | POA: Diagnosis present

## 2022-01-31 DIAGNOSIS — K5900 Constipation, unspecified: Secondary | ICD-10-CM | POA: Diagnosis present

## 2022-01-31 DIAGNOSIS — I472 Ventricular tachycardia, unspecified: Secondary | ICD-10-CM | POA: Diagnosis not present

## 2022-01-31 DIAGNOSIS — I5022 Chronic systolic (congestive) heart failure: Secondary | ICD-10-CM | POA: Diagnosis not present

## 2022-01-31 DIAGNOSIS — K219 Gastro-esophageal reflux disease without esophagitis: Secondary | ICD-10-CM | POA: Diagnosis present

## 2022-01-31 DIAGNOSIS — I5042 Chronic combined systolic (congestive) and diastolic (congestive) heart failure: Secondary | ICD-10-CM | POA: Diagnosis present

## 2022-01-31 DIAGNOSIS — I132 Hypertensive heart and chronic kidney disease with heart failure and with stage 5 chronic kidney disease, or end stage renal disease: Secondary | ICD-10-CM | POA: Diagnosis present

## 2022-01-31 DIAGNOSIS — E440 Moderate protein-calorie malnutrition: Secondary | ICD-10-CM | POA: Diagnosis present

## 2022-01-31 DIAGNOSIS — R55 Syncope and collapse: Secondary | ICD-10-CM | POA: Diagnosis present

## 2022-01-31 DIAGNOSIS — Z8501 Personal history of malignant neoplasm of esophagus: Secondary | ICD-10-CM | POA: Diagnosis not present

## 2022-01-31 DIAGNOSIS — F32A Depression, unspecified: Secondary | ICD-10-CM | POA: Diagnosis present

## 2022-01-31 DIAGNOSIS — R7989 Other specified abnormal findings of blood chemistry: Secondary | ICD-10-CM | POA: Diagnosis present

## 2022-01-31 DIAGNOSIS — Z6825 Body mass index (BMI) 25.0-25.9, adult: Secondary | ICD-10-CM | POA: Diagnosis not present

## 2022-01-31 DIAGNOSIS — Z66 Do not resuscitate: Secondary | ICD-10-CM | POA: Diagnosis present

## 2022-01-31 DIAGNOSIS — I25708 Atherosclerosis of coronary artery bypass graft(s), unspecified, with other forms of angina pectoris: Secondary | ICD-10-CM | POA: Diagnosis present

## 2022-01-31 DIAGNOSIS — Z992 Dependence on renal dialysis: Secondary | ICD-10-CM | POA: Diagnosis not present

## 2022-01-31 DIAGNOSIS — J9811 Atelectasis: Secondary | ICD-10-CM | POA: Diagnosis present

## 2022-01-31 DIAGNOSIS — I25810 Atherosclerosis of coronary artery bypass graft(s) without angina pectoris: Secondary | ICD-10-CM | POA: Diagnosis not present

## 2022-01-31 DIAGNOSIS — I25118 Atherosclerotic heart disease of native coronary artery with other forms of angina pectoris: Secondary | ICD-10-CM | POA: Diagnosis present

## 2022-01-31 DIAGNOSIS — E782 Mixed hyperlipidemia: Secondary | ICD-10-CM | POA: Diagnosis present

## 2022-01-31 DIAGNOSIS — Z951 Presence of aortocoronary bypass graft: Secondary | ICD-10-CM | POA: Diagnosis not present

## 2022-01-31 LAB — CBC
HCT: 28.7 % — ABNORMAL LOW (ref 39.0–52.0)
HCT: 28.1 % — ABNORMAL LOW (ref 39.0–52.0)
Hemoglobin: 9 g/dL — ABNORMAL LOW (ref 13.0–17.0)
Hemoglobin: 9.1 g/dL — ABNORMAL LOW (ref 13.0–17.0)
MCH: 26.9 pg (ref 26.0–34.0)
MCH: 26.8 pg (ref 26.0–34.0)
MCHC: 31.4 g/dL (ref 30.0–36.0)
MCHC: 32.4 g/dL (ref 30.0–36.0)
MCV: 85.9 fL (ref 80.0–100.0)
MCV: 82.9 fL (ref 80.0–100.0)
Platelets: 197 10*3/uL (ref 150–400)
Platelets: 207 10*3/uL (ref 150–400)
RBC: 3.34 MIL/uL — ABNORMAL LOW (ref 4.22–5.81)
RBC: 3.39 MIL/uL — ABNORMAL LOW (ref 4.22–5.81)
RDW: 16.4 % — ABNORMAL HIGH (ref 11.5–15.5)
RDW: 16.3 % — ABNORMAL HIGH (ref 11.5–15.5)
WBC: 6.3 10*3/uL (ref 4.0–10.5)
WBC: 5.3 10*3/uL (ref 4.0–10.5)
nRBC: 0 % (ref 0.0–0.2)
nRBC: 0 % (ref 0.0–0.2)

## 2022-01-31 LAB — RENAL FUNCTION PANEL
Albumin: 3.3 g/dL — ABNORMAL LOW (ref 3.5–5.0)
Albumin: 3.3 g/dL — ABNORMAL LOW (ref 3.5–5.0)
Anion gap: 12 (ref 5–15)
Anion gap: 11 (ref 5–15)
BUN: 51 mg/dL — ABNORMAL HIGH (ref 8–23)
BUN: 25 mg/dL — ABNORMAL HIGH (ref 8–23)
CO2: 20 mmol/L — ABNORMAL LOW (ref 22–32)
CO2: 26 mmol/L (ref 22–32)
Calcium: 8.8 mg/dL — ABNORMAL LOW (ref 8.9–10.3)
Calcium: 8.5 mg/dL — ABNORMAL LOW (ref 8.9–10.3)
Chloride: 103 mmol/L (ref 98–111)
Chloride: 98 mmol/L (ref 98–111)
Creatinine, Ser: 4.72 mg/dL — ABNORMAL HIGH (ref 0.61–1.24)
Creatinine, Ser: 2.88 mg/dL — ABNORMAL HIGH (ref 0.61–1.24)
GFR, Estimated: 13 mL/min — ABNORMAL LOW (ref >60)
GFR, Estimated: 23 mL/min — ABNORMAL LOW (ref >60)
Glucose, Bld: 215 mg/dL — ABNORMAL HIGH (ref 70–99)
Glucose, Bld: 235 mg/dL — ABNORMAL HIGH (ref 70–99)
Phosphorus: 4.2 mg/dL (ref 2.5–4.6)
Phosphorus: 2.5 mg/dL (ref 2.5–4.6)
Potassium: 4.3 mmol/L (ref 3.5–5.1)
Potassium: 3.5 mmol/L (ref 3.5–5.1)
Sodium: 135 mmol/L (ref 135–145)
Sodium: 135 mmol/L (ref 135–145)

## 2022-01-31 LAB — GLUCOSE, CAPILLARY
Glucose-Capillary: 167 mg/dL — ABNORMAL HIGH (ref 70–99)
Glucose-Capillary: 234 mg/dL — ABNORMAL HIGH (ref 70–99)
Glucose-Capillary: 280 mg/dL — ABNORMAL HIGH (ref 70–99)
Glucose-Capillary: 208 mg/dL — ABNORMAL HIGH (ref 70–99)

## 2022-01-31 LAB — HEPATITIS B SURFACE ANTIGEN: Hepatitis B Surface Ag: NONREACTIVE

## 2022-01-31 LAB — LIPID PANEL
Cholesterol: 92 mg/dL (ref 0–200)
HDL: 50 mg/dL (ref >40)
LDL Cholesterol: 24 mg/dL (ref 0–99)
Total CHOL/HDL Ratio: 1.8 RATIO
Triglycerides: 89 mg/dL (ref <150)
VLDL: 18 mg/dL (ref 0–40)

## 2022-01-31 LAB — HEMOGLOBIN A1C
Hgb A1c MFr Bld: 7.7 % — ABNORMAL HIGH (ref 4.8–5.6)
Mean Plasma Glucose: 174.29 mg/dL

## 2022-01-31 LAB — PHOSPHORUS: Phosphorus: 4.2 mg/dL (ref 2.5–4.6)

## 2022-01-31 LAB — HEPATITIS C ANTIBODY: HCV Ab: NONREACTIVE

## 2022-01-31 LAB — HEPATITIS B CORE ANTIBODY, TOTAL: Hep B Core Total Ab: NONREACTIVE

## 2022-01-31 LAB — HEPATITIS B SURFACE ANTIBODY,QUALITATIVE: Hep B S Ab: NONREACTIVE

## 2022-01-31 MED ORDER — SODIUM CHLORIDE 0.9% FLUSH
3.0000 mL | Freq: Two times a day (BID) | INTRAVENOUS | Status: DC
  Administered 2022-01-31 – 2022-02-01 (×3): 3 mL via INTRAVENOUS

## 2022-01-31 MED ORDER — SODIUM CHLORIDE 0.9 % IV SOLN: INTRAVENOUS | Status: DC

## 2022-01-31 MED ORDER — SENNOSIDES-DOCUSATE SODIUM 8.6-50 MG PO TABS
1.0000 | ORAL_TABLET | Freq: Every evening | ORAL | Status: DC | PRN
  Administered 2022-02-03: 1 via ORAL
  Filled 2022-01-31: qty 1

## 2022-01-31 MED ORDER — HEPARIN SODIUM (PORCINE) 1000 UNIT/ML IJ SOLN
INTRAMUSCULAR | Status: AC
  Administered 2022-01-31: 1000 [IU]
  Filled 2022-01-31: qty 4

## 2022-01-31 MED ORDER — SODIUM CHLORIDE 0.9 % IV SOLN: 250.0000 mL | INTRAVENOUS | Status: DC | PRN

## 2022-01-31 MED ORDER — SODIUM CHLORIDE 0.9% FLUSH
3.0000 mL | Freq: Two times a day (BID) | INTRAVENOUS | Status: DC
  Administered 2022-01-31 – 2022-02-01 (×2): 3 mL via INTRAVENOUS

## 2022-01-31 MED ORDER — ASPIRIN 81 MG PO CHEW
81.0000 mg | CHEWABLE_TABLET | ORAL | Status: AC
  Administered 2022-02-01: 81 mg via ORAL
  Filled 2022-01-31: qty 1

## 2022-01-31 MED ORDER — SODIUM CHLORIDE 0.9% FLUSH: 3.0000 mL | INTRAVENOUS | Status: DC | PRN

## 2022-01-31 MED ORDER — SODIUM CHLORIDE 0.9 % IV SOLN
INTRAVENOUS | Status: DC
Start: 2022-02-01 — End: 2022-02-01

## 2022-01-31 MED ORDER — ISOSORBIDE MONONITRATE ER 30 MG PO TB24
30.0000 mg | ORAL_TABLET | Freq: Every day | ORAL | Status: DC
  Administered 2022-01-31 – 2022-02-04 (×5): 30 mg via ORAL
  Filled 2022-01-31 (×5): qty 1

## 2022-01-31 MED ORDER — INSULIN ASPART 100 UNIT/ML IJ SOLN
0.0000 [IU] | Freq: Three times a day (TID) | INTRAMUSCULAR | Status: DC
  Administered 2022-01-31: 5 [IU] via SUBCUTANEOUS
  Administered 2022-02-01: 3 [IU] via SUBCUTANEOUS
  Administered 2022-02-01: 2 [IU] via SUBCUTANEOUS
  Administered 2022-02-02: 7 [IU] via SUBCUTANEOUS
  Administered 2022-02-03: 3 [IU] via SUBCUTANEOUS
  Administered 2022-02-03: 7 [IU] via SUBCUTANEOUS
  Administered 2022-02-03: 1 [IU] via SUBCUTANEOUS
  Administered 2022-02-04: 2 [IU] via SUBCUTANEOUS

## 2022-01-31 MED ORDER — METOPROLOL TARTRATE 5 MG/5ML IV SOLN: 5.0000 mg | INTRAVENOUS | Status: DC | PRN

## 2022-01-31 MED ORDER — GUAIFENESIN-DM 100-10 MG/5ML PO SYRP
5.0000 mL | ORAL_SOLUTION | ORAL | Status: DC | PRN
  Administered 2022-01-31 – 2022-02-03 (×8): 5 mL via ORAL
  Filled 2022-01-31 (×9): qty 5

## 2022-01-31 MED ORDER — TRAZODONE HCL 50 MG PO TABS
50.0000 mg | ORAL_TABLET | Freq: Every evening | ORAL | Status: DC | PRN
  Administered 2022-01-31 – 2022-02-03 (×4): 50 mg via ORAL
  Filled 2022-01-31 (×4): qty 1

## 2022-01-31 MED ORDER — HYDRALAZINE HCL 20 MG/ML IJ SOLN: 10.0000 mg | INTRAMUSCULAR | Status: DC | PRN

## 2022-01-31 NOTE — Inpatient Diabetes Management (Signed)
Inpatient Diabetes Program Recommendations  AACE/ADA: New Consensus Statement on Inpatient Glycemic Control (2015)  Target Ranges:  Prepandial:   less than 140 mg/dL      Peak postprandial:   less than 180 mg/dL (1-2 hours)      Critically ill patients:  140 - 180 mg/dL   Lab Results  Component Value Date   GLUCAP 234 (H) 01/31/2022   HGBA1C 7.7 (H) 01/31/2022    Review of Glycemic Control  Diabetes history: DM 2 Outpatient Diabetes medications: Novolog SSI Current orders for Inpatient glycemic control:  Novolog 0-9 units tid  A1c 7.7% this admission  Pt from SNF. Consult for DM education. Novolog Correction started today glucose trends in the 200 range. Will follow glucose trends at this time.   Thanks,  Tama Headings RN, MSN, BC-ADM Inpatient Diabetes Coordinator Team Pager [phone] (8a-5p)

## 2022-01-31 NOTE — Progress Notes (Signed)
  Shared Decision Making/Informed Consent  The risks [stroke (1 in 1000), death (1 in 1000), kidney failure [usually temporary] (1 in 500), bleeding (1 in 200), allergic reaction [possibly serious] (1 in 200)], benefits (diagnostic support and management of coronary artery disease) and alternatives of a cardiac catheterization were discussed in detail with Mr. Grosso and he is willing to proceed.

## 2022-01-31 NOTE — Progress Notes (Addendum)
Heart rate went as high up to 167 beats/min for a couple of mins.Stopped fluid removal as of this time.No complaint of chest but he has had light headed. Heart rate went  downto normal 80"s ,after UF stopped.PMD at the bedside.No order received.

## 2022-01-31 NOTE — Progress Notes (Signed)
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  Troponins were flat, EKG was nonischemic but given symptoms and history he was transferred to [facility] for further cardiac evaluation.   Assessment & Plan:  Principal Problem:   Chest pain Active Problems:   Essential hypertension, benign   Mixed hyperlipidemia   Coronary artery disease involving coronary bypass graft of native heart with other forms of angina pectoris (HCC)   GERD (gastroesophageal reflux disease)   CHF (congestive heart failure) (HCC)   ESRD on dialysis [facility])   Depression    Chest pain -Given symptomatology and risk factors with previous history of CABG he has been transferred to [facility] for further cardiac evaluation.  LDL 32, A1c 7.7.  Cardiology planning on left heart cath tomorrow.  ESRD on dialysis [facility]) MWF Cardiology team following for hemodialysis   Congestive heart failure with reduced EF, EF 40% Slightly elevated BNP chest x-ray showing cardiomegaly but no pulmonary edema.  Hemodialysis should help.  Continue Coreg, Imdur   Essential hypertension, benign - Continue hydralazine, Coreg  Diabetes mellitus type 2 - A1c 7.7.  We will place him on sliding scale and Accu-Cheks.  Diabetic coordinator consulted   Depression - Continue Zoloft    GERD (gastroesophageal reflux disease) - Continue Protonix   CAD status post CABG -Continue current medications.  Cardiology consulted   Mixed hyperlipidemia - Resume Repatha at discharge    DVT prophylaxis: Subcu heparin Code Status: Full code Family Communication:    Patient seen and examined at bedside, cardiology planning on left heart catheterization tomorrow    Subjective: During my visit patient was on  HD.  Denied any active chest pain but did get lightheaded and dizzy earlier.  Examination:  General exam: Appears calm and comfortable  Respiratory system: Clear to auscultation. Respiratory effort normal. Cardiovascular system: S1 & S2 heard, RRR. No JVD, murmurs, rubs, gallops or clicks. No pedal edema. Gastrointestinal system: Abdomen is nondistended, soft and nontender. No organomegaly or masses felt. Normal bowel sounds heard. Central nervous system: Alert and oriented. No focal neurological deficits. Extremities: Symmetric 5 x 5 power. Skin: No rashes, lesions or ulcers Psychiatry: Judgement and insight appear normal. Mood & affect appropriate.  Right subclavian HD catheter in place Maturing left upper extremity AV fistula   Objective: Vitals:   01/31/22 0000 01/31/22 0230 01/31/22 0730 01/31/22 0800  BP: 138/74 (!) 154/83 (!) 154/77 (!) 158/61  Pulse:  78 76 76  Resp: 18  18 (!) 21  Temp: 98 F (36.7 C) 98 F (36.7 C) 97.8 F (36.6 C)   TempSrc: Oral Oral Oral   SpO2:  98% 95% 93%  Weight:      Height:       No intake or output data in the 24 hours ending 01/31/22 0831 Filed Weights   01/30/22 1817  Weight: 85.1 kg     Data Reviewed:   CBC: Recent Labs  Lab 01/29/22 2150 01/30/22 0522  WBC 9.7 8.2  NEUTROABS 7.7  --   HGB 8.8* 9.7*  HCT 28.4* 31.3*  MCV 87.1 86.7  PLT 202 941   Basic Metabolic Panel: Recent Labs  Lab 01/29/22 2150 01/30/22 0522  NA 138 136  K 3.8 4.1  CL  105 104  CO2 24 23  GLUCOSE 137* 245*  BUN 46* 48*  CREATININE 4.11* 4.19*  CALCIUM 8.5* 8.3*  MG  --  2.0   GFR: Estimated Creatinine Clearance: 18.1 mL/min (A) (by C-G formula based on SCr of 4.19 mg/dL (H)). Liver Function Tests: Recent Labs  Lab 01/29/22 2150 01/30/22 0522  AST 25 17  ALT 23 22  ALKPHOS 163* 149*  BILITOT 0.4 0.4  PROT 6.7 6.3*  ALBUMIN 3.4* 3.3*   No results for input(s): "LIPASE", "AMYLASE" in the last 168 hours. No results for input(s):  "AMMONIA" in the last 168 hours. Coagulation Profile: No results for input(s): "INR", "PROTIME" in the last 168 hours. Cardiac Enzymes: No results for input(s): "CKTOTAL", "CKMB", "CKMBINDEX", "TROPONINI" in the last 168 hours. BNP (last 3 results) No results for input(s): "PROBNP" in the last 8760 hours. HbA1C: No results for input(s): "HGBA1C" in the last 72 hours. CBG: Recent Labs  Lab 01/30/22 0758 01/30/22 1145 01/30/22 1650 01/31/22 0554  GLUCAP 208* 208* 277* 167*   Lipid Profile: Recent Labs    01/30/22 0522  CHOL 98  HDL 56  LDLCALC 32  TRIG 48  CHOLHDL 1.8   Thyroid Function Tests: No results for input(s): "TSH", "T4TOTAL", "FREET4", "T3FREE", "THYROIDAB" in the last 72 hours. Anemia Panel: No results for input(s): "VITAMINB12", "FOLATE", "FERRITIN", "TIBC", "IRON", "RETICCTPCT" in the last 72 hours. Sepsis Labs: No results for input(s): "PROCALCITON", "LATICACIDVEN" in the last 168 hours.  No results found for this or any previous visit (from the past 240 hour(s)).       Radiology Studies: DG Chest Port 1 View  Result Date: 01/29/2022 CLINICAL DATA:  Shortness of breath and dizziness. History of esophageal cancer. EXAM: PORTABLE CHEST 1 VIEW COMPARISON:  01/25/2022. FINDINGS: Heart is enlarged the mediastinal contour is stable. Sternotomy wires are present over the midline. A stable right internal jugular central venous catheter is noted. There is a small right pleural effusion with mild atelectasis or infiltrate at the right lung base. The left lung is clear. No pneumothorax. No acute osseous abnormality. IMPRESSION: 1. Small right pleural effusion with atelectasis or infiltrate. 2. Cardiomegaly. Electronically Signed   By: Brett Fairy M.D.   On: 01/29/2022 22:19        Scheduled Meds:  calcitRIOL  0.25 mcg Oral Q M,W,F-HD   calcium acetate  667 mg Oral TID WC   carvedilol  12.5 mg Oral BID   Chlorhexidine Gluconate Cloth  6 each Topical Q0600    doxazosin  4 mg Oral Daily   heparin  5,000 Units Subcutaneous Q8H   hydrALAZINE  100 mg Oral TID   isosorbide mononitrate  15 mg Oral Daily   pantoprazole  40 mg Oral Daily   sertraline  150 mg Oral QHS   sucralfate  1 g Oral TID AC   Continuous Infusions:   LOS: 0 days   Time spent= 35 mins    Guillermo Difrancesco Arsenio Loader, MD [facility]  If 7PM-7AM, please contact night-coverage  01/31/2022, 8:31 AM

## 2022-01-31 NOTE — Plan of Care (Signed)
  Problem: Education: Goal: Knowledge of General Education information will improve Description: Including pain rating scale, medication(s)/side effects and non-pharmacologic comfort measures Outcome: Progressing   Problem: Health Behavior/Discharge Planning: Goal: Ability to manage health-related needs will improve Outcome: Progressing   Problem: Clinical Measurements: Goal: Ability to maintain clinical measurements within normal limits will improve Outcome: Progressing Goal: Will remain free from infection Outcome: Progressing Goal: Diagnostic test results will improve Outcome: Progressing Goal: Respiratory complications will improve Outcome: Progressing Goal: Cardiovascular complication will be avoided Outcome: Progressing   Problem: Activity: Goal: Risk for activity intolerance will decrease Outcome: Progressing   Problem: Nutrition: Goal: Adequate nutrition will be maintained Outcome: Progressing   Problem: Coping: Goal: Level of anxiety will decrease Outcome: Progressing   Problem: Elimination: Goal: Will not experience complications related to bowel motility Outcome: Progressing Goal: Will not experience complications related to urinary retention Outcome: Progressing   Problem: Pain Managment: Goal: General experience of comfort will improve Outcome: Progressing   Problem: Safety: Goal: Ability to remain free from injury will improve Outcome: Progressing   Problem: Skin Integrity: Goal: Risk for impaired skin integrity will decrease Outcome: Progressing   Problem: Education: Goal: Understanding of CV disease, CV risk reduction, and recovery process will improve Outcome: Progressing Goal: Individualized Educational Video(s) Outcome: Progressing   Problem: Activity: Goal: Ability to return to baseline activity level will improve Outcome: Progressing   Problem: Cardiovascular: Goal: Ability to achieve and maintain adequate cardiovascular perfusion  will improve Outcome: Progressing Goal: Vascular access site(s) Level 0-1 will be maintained Outcome: Progressing   Problem: Health Behavior/Discharge Planning: Goal: Ability to safely manage health-related needs after discharge will improve Outcome: Progressing   Problem: Education: Goal: Ability to describe self-care measures that may prevent or decrease complications (Diabetes Survival Skills Education) will improve Outcome: Progressing Goal: Individualized Educational Video(s) Outcome: Progressing   Problem: Coping: Goal: Ability to adjust to condition or change in health will improve Outcome: Progressing   Problem: Fluid Volume: Goal: Ability to maintain a balanced intake and output will improve Outcome: Progressing   Problem: Health Behavior/Discharge Planning: Goal: Ability to identify and utilize available resources and services will improve Outcome: Progressing Goal: Ability to manage health-related needs will improve Outcome: Progressing   Problem: Metabolic: Goal: Ability to maintain appropriate glucose levels will improve Outcome: Progressing   Problem: Nutritional: Goal: Maintenance of adequate nutrition will improve Outcome: Progressing Goal: Progress toward achieving an optimal weight will improve Outcome: Progressing   Problem: Skin Integrity: Goal: Risk for impaired skin integrity will decrease Outcome: Progressing   Problem: Tissue Perfusion: Goal: Adequacy of tissue perfusion will improve Outcome: Progressing   

## 2022-01-31 NOTE — Consult Note (Signed)
Cardiology Consultation:   Patient ID: [name] MRN: [id number]; DOB: 03/21/1956  Admit date: 01/29/2022 Date of Consult: 01/31/2022  PCP:  [name], MD   [facility] Providers Cardiologist:  Skeet Latch, MD   {   Patient Profile:   [name] is a 66 y.o. male with a hx of chronic combined CHF,  esophageal cancer, CVA, CAD s/p CABG x4 03/2019, HTN, PAD, type 2 DM, ESRD on HD, OSA on ASV, who is being seen 01/31/2022 for the evaluation of chest pain at the request of [name].  History of Present Illness:   [name] with above PMH presented to ER c/o chest pain and syncope.   He currently follows [name] for CAD and CHF, use to see [name] . He had hx of CABG with  LIMA to the LAD, SVG to PLV, OM2 and diagonal 1 on 03/25/2019.   Most recent NM stress myvoiew was done on 02/12/21 which was a high risk study and showed moderate size and intensity, mostly fixed basal to mid inferior and inferolateral perfusion defect (SDS 1), suggestive of scar with minimal peri-infarct ischemia. LVEF 28% with global hypokinesis and basal to mid inferior akinesis.  In comparison with a prior study in 2021, the LVEF is lower (was previously 35%), however, there are no new reversible perfusion defects. He was recommended medical therapy given advanced CKD at follow up appointment on 05/31/21 with [name], maintained on Coreg, ASA, Repatha (intolerant to statin), and Imdur.   Most recent Echo from 07/25/21 showed LVEF 40-45% , akinesis of the left ventricular, basal inferior wall and inferoseptal wall. Moderately reduced RV. RVSP elevated 56.5 mmHg. Mod LAE. Mild to Mod MR. Mild dilatation of the aortic root 39 mm. He is on GDMT with Coreg, hydralazine, Imdur historically.   ZIO monitor from 12/2020 showed predominantly NSR with PVC burden 3.5%, 3 episodes NSVT up to 5 beats and up to 12 runs of SVT  He is historically intolerant to amlodipine, lisinopril,  hydrochlorothiazide, beta-blocker due to sensation of fatigue. He is on coreg and doxazosin and hydralazine and imdur for BP control.   He was last seen by [name] on 01/11/22 for CHF follow up, he resides at SNF, his main complaint was fatigue since his CABG, reports worsening depression with suicide ideation. He is on dialysis, continue making urine on torsemide. It was felt his symptomology is multifactorial from CHF, cancer, ESRD. He had no anginal symptoms, remained on above mentioned medical therapy for CAD/CHF.    Patient presented to the [facility] on 01/29/2022 complaining chest pain.  He reports sitting on the toilet and straining to have a bowel movement, meanwhile developed midsternal chest tightness/pressure radiating to left side of chest, numbness and tingling of both arms.  He reports associated nausea, vomiting, shortness of breath, fatigue.  He felt he was having a heart attack, symptom was similar to previous MI. Symptoms lasted  20 minutes and resolved. He had a subsequent syncope episode in the wheelchair. He does not recall much detail on this, denied having any chest pain prior to passing out.   He reports no injury.  He states he is having overall decondition and not able to do much due to gait instability. He was given 2 sublingual nitroglycerin by SNF staff which seemed to help improving the pain. He is currently chest pain free. He was at [facility] [place] last week for panic attack, had CTA chest  negative for PE.   Admission diagnostic revealed elevated BUN 46, creatinine 4.11 and GFR 15, alk phos 163, albumin 3.4.  BNP elevated 1906.  High sensitive troponin 30 >31 >32.  CBC diff with hemoglobin 8.8, otherwise unremarkable.Chest x-ray revealed small right pleural effusion with atelectasis/infiltrate.  EKG revealed sinus rhythm, 94 bpm, inferolateral T wave inversion that appears old.  He was admitted to family medicine service. He was concerned for ischemic chest  pain and transferred to [facility] for further management. Cardiology is consulted for further input.      Past Medical History:  Diagnosis Date   Anemia    Arthritis    hips shoulders   CAD (coronary artery disease) 03/03/2019   Cancer ([facility])    Esophageal cancer 2022   CHF (congestive heart failure) ([place]) 2020   Chronic combined systolic and diastolic heart failure ([place]) 04/26/2018   Admitted 11/14-11/20/19-diuresed 10L   Depression 05/31/2021   Fatigue 10/14/2020   Hypertension    Myocardial infarction [facility])    Sleep apnea    uses a bipap machine   Stroke ([place]) 12/14/2020   no last weakness or paralysis   Type 2 diabetes mellitus with diabetic nephropathy ([place]) 04/23/2019   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    Past Surgical History:  Procedure Laterality Date   AV FISTULA PLACEMENT Left 12/21/2021   Procedure: LEFT ARM ARTERIOVENOUS (AV) FISTULA CREATION;  Surgeon: Rosetta Posner, MD;  Location: [facility];  Service: Vascular;  Laterality: Left;   BIOPSY  06/27/2019   Procedure: BIOPSY;  Surgeon: Rogene Houston, MD;  [facility];  Service: Endoscopy;;  ascending colon polyp   BIOPSY  10/21/2020   Procedure: BIOPSY;  Surgeon: Rogene Houston, MD;  [facility];  Service: Endoscopy;;  esophageal,gastric polyp   BIOPSY  02/24/2021   Procedure: BIOPSY;  Surgeon: Rogene Houston, MD;  [facility];  Service: Endoscopy;;  distal and proximal esophageal biopsies    CARDIAC SURGERY     CATARACT EXTRACTION     COLONOSCOPY N/A 06/27/2019   [name]:Diverticulosis in the entire examined colon. tubular adenoma in ascending colon, 97m tubular adenoma in prox sigmoid. external hemorrhoids   CORONARY ARTERY BYPASS GRAFT N/A 03/25/2019   Procedure: CORONARY ARTERY BYPASS GRAFTING (CABG) X  4 USING LEFT INTERNAL MAMMARY ARTERY AND RIGHT SAPHENOUS VEIN GRAFTS;  Surgeon: LLajuana Matte MD;  [place]  Service: Open Heart Surgery;  Laterality: N/A;    ESOPHAGOGASTRODUODENOSCOPY (EGD) WITH PROPOFOL N/A 10/21/2020   rehman:Normal hypopharynx.Normal proximal esophagus and mid esophagus.Esophageal mucosal changes consistent with long-segment Barrett's esophagus. (Focal low-grade dysplasia and atypia proximally) z line irregular 37 cm from incisors, 3 cm HH, single gastric polyp (fundic gland) normal duodenal bulb/second portion of duodenum, proximal margin of Barrett's at 34 cm   ESOPHAGOGASTRODUODENOSCOPY (EGD) WITH PROPOFOL N/A 02/24/2021   Procedure: ESOPHAGOGASTRODUODENOSCOPY (EGD) WITH PROPOFOL;  Surgeon: RRogene Houston MD;  [facility];  Service: Endoscopy;  Laterality: N/A;  1:35   ESOPHAGOGASTRODUODENOSCOPY (EGD) WITH PROPOFOL N/A 03/21/2021   Procedure: ESOPHAGOGASTRODUODENOSCOPY (EGD) WITH PROPOFOL;  Surgeon: HCarol Ada MD;  [place]  Service: Endoscopy;  Laterality: N/A;   ESOPHAGOGASTRODUODENOSCOPY (EGD) WITH PROPOFOL N/A 10/23/2021   Procedure: ESOPHAGOGASTRODUODENOSCOPY (EGD) WITH PROPOFOL;  Surgeon: DDoran Stabler MD;  [place]  Service: Gastroenterology;  Laterality: N/A;   EXCISION MORTON'S NEUROMA     EYE SURGERY Left    retina   INCISION AND  DRAINAGE OF WOUND Right 06/28/2021   Procedure: IRRIGATION AND DEBRIDEMENT WOUND;  Surgeon: [name], MD;  Location: [place];  Service: Plastics;  Laterality: Right;  1.5 hour   INSERTION OF DIALYSIS CATHETER Right 12/21/2021   Procedure: INSERTION OF TUNNELED DIALYSIS CATHETER;  Surgeon: Rosetta Posner, MD;  [facility];  Service: Vascular;  Laterality: Right;   POLYPECTOMY  06/27/2019   Procedure: POLYPECTOMY;  Surgeon: Rogene Houston, MD;  [facility];  Service: Endoscopy;;  proximal sigmoid colon   RIGHT/LEFT HEART CATH AND CORONARY ANGIOGRAPHY N/A 03/12/2019   Procedure: RIGHT/LEFT HEART CATH AND CORONARY ANGIOGRAPHY;  Surgeon: [name], MD;  Location: [place] [facility];  Service: Cardiovascular;   Laterality: N/A;   SHOULDER ARTHROSCOPY WITH ROTATOR CUFF REPAIR AND SUBACROMIAL DECOMPRESSION Right 08/26/2020   Procedure: RIGHT SHOULDER MINI OPEN ROTATOR CUFF REPAIR AND SUBACROMIAL DECOMPRESSION WITH PATCH GRAFT;  Surgeon: Susa Day, MD;  [facility]: [facility];  Service: Orthopedics;  Laterality: Right;  90 MINS GENERAL WITH BLOCK   SKIN SPLIT GRAFT Right 06/28/2021   Procedure: SKIN GRAFT SPLIT THICKNESS;  Surgeon: [name], MD;  Location: [place];  Service: Plastics;  Laterality: Right;   TEE WITHOUT CARDIOVERSION N/A 03/25/2019   Procedure: TRANSESOPHAGEAL ECHOCARDIOGRAM (TEE);  Surgeon: [name], MD;  Location: [place];  Service: Open Heart Surgery;  Laterality: N/A;     Home Medications:  Prior to Admission medications   Medication Sig Start Date End Date Taking? Authorizing Provider  acetaminophen (TYLENOL) 325 MG tablet Take 2 tablets (650 mg total) by mouth every 6 (six) hours as needed for mild pain, moderate pain or fever. 08/02/21  Yes Allie Bossier, MD  albuterol (VENTOLIN HFA) 108 (90 Base) MCG/ACT inhaler Inhale 2 puffs into the lungs every 6 (six) hours as needed. Patient taking differently: Inhale 2 puffs into the lungs every 6 (six) hours as needed for wheezing or shortness of breath. 06/24/21  Yes Cook, Jayce G, DO  ARIPiprazole (ABILIFY) 5 MG tablet Take 5 mg by mouth daily.   Yes [provider]  ascorbic acid (VITAMIN C) 500 MG tablet Take 1 tablet (500 mg total) by mouth daily. 08/03/21  Yes Allie Bossier, MD  calcium acetate (PHOSLO) 667 MG capsule Take 667 mg by mouth in the morning, at noon, and at bedtime.   Yes [provider]  calcium-vitamin D (OSCAL WITH D) 500-5 MG-MCG tablet Take 1 tablet by mouth 2 (two) times daily.   Yes [provider]  carvedilol (COREG) 12.5 MG tablet Take 1 tablet (12.5 mg total) by mouth 2 (two) times daily. Patient taking differently: Take 12.5 mg by mouth See admin instructions. Take 12.5 mg  by mouth twice a day on non-dialysis days. (Tue, Thu, Sat, Sun) 09/14/21  Yes Loel Dubonnet, NP  cholecalciferol (VITAMIN D) 25 MCG (1000 UNIT) tablet Take 1,000 Units by mouth daily.   Yes [provider]  doxazosin (CARDURA) 4 MG tablet Take 1 tablet (4 mg total) by mouth daily. 09/14/21  Yes Loel Dubonnet, NP  EPINEPHrine 0.3 mg/0.3 mL IJ SOAJ injection Inject 0.3 mg into the muscle as needed for anaphylaxis. 01/25/21  Yes [name], MD  hydrALAZINE (APRESOLINE) 100 MG tablet Take 1 tablet (100 mg total) by mouth 3 (three) times daily. Patient taking differently: Take 100 mg by mouth See admin instructions. Take 3 times a day on non-dialysis days for HTN. (Tue, Thu, Sat, Sun) 11/01/21  Yes Loel Dubonnet, NP  hydrOXYzine (VISTARIL) 25 MG capsule TAKE ONE CAPSULE BY MOUTH NIGHTLY AS NEEDED FOR ALLERGIES AND INSOMNIA Patient taking differently: Take 25 mg by mouth at bedtime as needed (allerigies and insomnia). 11/01/21  Yes [name], [name], MD  insulin aspart (NOVOLOG) 100 UNIT/ML injection Inject 0-15 Units into the skin 3 (three) times daily before meals. Per sliding scale   Yes [provider]  isosorbide mononitrate (IMDUR) 30 MG 24 hr tablet Take 0.5 tablets (15 mg total) by mouth daily. Patient taking differently: Take 15 mg by mouth 4 (four) times a week. Lavena Bullion 10/27/21  Yes Domenic Polite, MD  lidocaine (ASPERCREME LIDOCAINE) 4 % Place 1 patch onto the skin daily. Wear for 12 hours then remove, wait 12 hours before applying a new patch to lower back   Yes [provider]  Multiple Vitamins-Minerals (THERATRUM COMPLETE) TABS Take 1 tablet by mouth daily.   Yes [provider]  nitroGLYCERIN (NITROSTAT) 0.4 MG SL tablet Place 1 tablet (0.4 mg total) under the tongue every 5 (five) minutes as needed for chest pain. 09/14/21  Yes Loel Dubonnet, NP  Nutritional Supplements (FEEDING SUPPLEMENT, NEPRO CARB STEADY,) LIQD Take 237 mLs by  mouth 2 (two) times daily with a meal.   Yes [provider]  omeprazole (PRILOSEC) 40 MG capsule Take 40 mg by mouth in the morning and at bedtime.   Yes [provider]  ondansetron (ZOFRAN) 4 MG tablet Take 4 mg by mouth every 8 (eight) hours as needed for nausea or vomiting.   Yes [provider]  REPATHA SURECLICK 119 MG/ML SOAJ Inject 140 mg as directed every 14 (fourteen) days. Patient taking differently: Inject 140 mg as directed every 14 (fourteen) days. Starting 02/08/22 09/14/21  Yes Loel Dubonnet, NP  sertraline (ZOLOFT) 100 MG tablet Take 1 and half tablet  by mouth daily Patient taking differently: Take 150 mg by mouth daily. 09/20/21  Yes [name], [name], MD  torsemide (DEMADEX) 20 MG tablet Take 40 mg by mouth daily. 10/11/21  Yes [provider]  traMADol (ULTRAM) 50 MG tablet Take 0.5 tablets (25 mg total) by mouth every 12 (twelve) hours as needed for moderate pain. 10/28/21  Yes Arrien, Jimmy Picket, MD  ferrous sulfate (FEROSUL) 325 (65 FE) MG tablet Take 325 mg by mouth daily. Patient not taking: Reported on 01/30/2022    [provider]  folic acid (FOLVITE) 147 MCG tablet Take 800 mcg by mouth daily. Patient not taking: Reported on 01/30/2022    [provider]  Glucerna (GLUCERNA) LIQD Take 237 mLs by mouth 2 (two) times daily. Patient not taking: Reported on 01/30/2022    [provider]  Insulin Pen Needle (B-D ULTRAFINE III SHORT PEN) 31G X 8 MM MISC 1 each by Does not apply route as directed. 05/30/19   Cassandria Anger, MD  oxyCODONE-acetaminophen (PERCOCET) 5-325 MG tablet Take 1 tablet by mouth every 6 (six) hours as needed for severe pain. Patient not taking: Reported on 01/30/2022 12/21/21   Rosetta Posner, MD  Polyethylene Glycol 3350 POWD Take 17 g by mouth daily. Mix in 240 mL of fluid Patient not taking: Reported on 01/30/2022    [provider]  sucralfate (CARAFATE) 1 GM/10ML suspension  Take 10 mLs (1 g total) by mouth 3 (three) times daily before meals. Patient not taking: Reported on 01/30/2022 10/26/21   Domenic Polite, MD  vitamin B-12 (CYANOCOBALAMIN) 1000  MCG tablet Take 1,000 mcg by mouth daily. Patient not taking: Reported on 01/30/2022    [provider]    Inpatient Medications: Scheduled Meds:  calcitRIOL  0.25 mcg Oral Q M,W,F-HD   calcium acetate  667 mg Oral TID WC   carvedilol  12.5 mg Oral BID   Chlorhexidine Gluconate Cloth  6 each Topical Q0600   doxazosin  4 mg Oral Daily   heparin  5,000 Units Subcutaneous Q8H   hydrALAZINE  100 mg Oral TID   isosorbide mononitrate  15 mg Oral Daily   pantoprazole  40 mg Oral Daily   sertraline  150 mg Oral QHS   sucralfate  1 g Oral TID AC   Continuous Infusions:  PRN Meds: acetaminophen **OR** acetaminophen, guaiFENesin-dextromethorphan, hydrALAZINE, hydrOXYzine, metoprolol tartrate, morphine injection, ondansetron **OR** ondansetron (ZOFRAN) IV, oxyCODONE-acetaminophen, senna-docusate, traMADol, traZODone  Allergies:    Allergies  Allergen Reactions   Bee Venom Anaphylaxis   Atorvastatin Other (See Comments)    myalgia   Diltiazem Itching   Reglan [Metoclopramide] Other (See Comments)    Suicidal    Rosuvastatin Other (See Comments)    myalgias   Valsartan Itching    Social History:   Social History   Socioeconomic History   Marital status: Single    Spouse name: Not on file   Number of children: Not on file   Years of education: Not on file   Highest education level: Not on file  Occupational History   Not on file  Tobacco Use   Smoking status: Former    Types: Cigarettes    Quit date: 07/06/1988    Years since quitting: 33.5   Smokeless tobacco: Never  Vaping Use   Vaping Use: Never used  Substance and Sexual Activity   Alcohol use: Not Currently    Alcohol/week: 2.0 standard drinks of alcohol    Types: 2 Glasses of wine per week    Comment: socially   Drug use: No    Sexual activity: Not on file  Other Topics Concern   Not on file  Social History Narrative   Not on file   Social Determinants of Health   Financial Resource Strain: Medium Risk (08/16/2021)   Overall Financial Resource Strain (CARDIA)    Difficulty of Paying Living Expenses: Somewhat hard  Food Insecurity: No Food Insecurity (08/16/2021)   Hunger Vital Sign    Worried About Running Out of Food in the Last Year: Never true    Ran Out of Food in the Last Year: Never true  Transportation Needs: No Transportation Needs (08/16/2021)   PRAPARE - Hydrologist (Medical): No    Lack of Transportation (Non-Medical): No  Physical Activity: Not on file  Stress: Not on file  Social Connections: Not on file  Intimate Partner Violence: Not on file    Family History:    Family History  Problem Relation Age of Onset   Colon cancer Mother    Heart Problems Father    Diabetes Father    Valvular heart disease Father    Sleep apnea Neg Hx    Stroke Neg Hx      ROS:  Constitutional: Denied fever, chills, malaise, night sweats Eyes: Denied vision change or loss Ears/Nose/Mouth/Throat: Denied ear ache, sore throat, coughing, sinus pain Cardiovascular: see HPI  Respiratory: see HPI  Gastrointestinal: see HPI  Genital/Urinary: Denied dysuria, hematuria, urinary frequency/urgency Musculoskeletal: Denied muscle ache, joint pain, weakness Skin: Denied rash, wound Neuro: Denied  headache, dizziness, syncope Psych: history of depression/anxiety  Endocrine: history of diabetes   Physical Exam/Data:   Vitals:   01/31/22 0830 01/31/22 0900 01/31/22 0930 01/31/22 1000  BP: (!) 154/66 (!) 157/76 (!) 163/71 (!) 159/79  Pulse: 77 72 73   Resp: (!) '24 18 16   ' Temp:      TempSrc:      SpO2: 96% 96% 96%   Weight:      Height:       No intake or output data in the 24 hours ending 01/31/22 1006    01/30/2022    6:17 PM 01/26/2022    3:20 PM 01/11/2022   11:19 AM  Last 3  Weights  Weight (lbs) 187 lb 9.8 oz 182 lb 185 lb 11.2 oz  Weight (kg) 85.1 kg 82.555 kg 84.233 kg     Body mass index is 26.92 kg/m.   Vitals:  Vitals:   01/31/22 1030 01/31/22 1100  BP: (!) 163/81 (!) 168/76  Pulse: 74 82  Resp: (!) 22 15  Temp:    SpO2: 100% 98%   General Appearance: In no apparent distress, laying in bed HEENT: Normocephalic, atraumatic.  Neck: Supple, trachea midline, no JVDs, no carotid bruits. Cardiovascular: Regular rate and rhythm, normal S1-S2,  no murmur Respiratory: Resting breathing unlabored, lungs sounds clear to auscultation bilaterally, no use of accessory muscles. On room air.  No wheezes, rales or rhonchi.   Gastrointestinal: Bowel sounds positive, abdomen soft Extremities: Able to move all extremities in bed without difficulty, no edema, RLE burn scar  Musculoskeletal: Normal muscle bulk and tone Skin: Intact, warm, dry. No rashes or petechiae noted in exposed areas.  Neurologic: Alert, oriented to person, place and time. Fluent speech, no facial droop, no cognitive deficit Psychiatric: Depressed mood  Right sided dialysis catheter in use    EKG:  The EKG was personally reviewed and demonstrates:  SR with old TWI of inferolateral leads   Telemetry:  Telemetry was personally reviewed and demonstrates:  no data since patient is dialysis currently     Relevant CV Studies:  Echo 07/25/21:   1. Left ventricular ejection fraction, by estimation, is 40 to 45%. The  left ventricle has mildly decreased function. The left ventricle  demonstrates regional wall motion abnormalities (see scoring  diagram/findings for description). There is mild  concentric left ventricular hypertrophy. Left ventricular diastolic  function could not be evaluated. There is akinesis of the left  ventricular, basal inferior wall and inferoseptal wall.   2. Right ventricular systolic function is moderately reduced. The right  ventricular size is severely enlarged.  There is moderately elevated  pulmonary artery systolic pressure. The estimated right ventricular  systolic pressure is 14.4 mmHg.   3. Left atrial size was moderately dilated.   4. The mitral valve is degenerative. Mild to moderate mitral valve  regurgitation. No evidence of mitral stenosis.   5. The aortic valve is normal in structure. Aortic valve regurgitation is  not visualized. No aortic stenosis is present.   6. Aortic dilatation noted. There is mild dilatation of the aortic root,  measuring 39 mm.   7. The inferior vena cava is dilated in size with <50% respiratory  variability, suggesting right atrial pressure of 15 mmHg.   NM stress 02/12/21:    Findings are consistent with prior myocardial infarction with peri-infarct ischemia. The study is high risk.   No ST deviation was noted.   LV perfusion is abnormal. There is evidence of ischemia.  There is evidence of infarction. Defect 1: There is a medium defect with moderate reduction in uptake present in the mid to basal inferior and inferolateral location(s) that is fixed. There is abnormal wall motion in the defect area. Consistent with infarction and peri-infarct ischemia.   Left ventricular function is abnormal. Global function is severely reduced. There was a single regional abnormality. Nuclear stress EF: 28 %. The left ventricular ejection fraction is severely decreased (<30%). End diastolic cavity size is moderately enlarged. End systolic cavity size is moderately enlarged.   Prior study available for comparison from 09/25/2019. There are changes compared to prior study. The left ventricular ejection fraction has decreased. LVEF 35%, moderate size and intensity fixed inferior defect suggestive of scar with mild peri-infarct ischemia   Moderate size and intensity, mostly fixed basal to mid inferior and inferolateral perfusion defect (SDS 1), suggestive of scar with minimal peri-infarct ischemia. LVEF 28% with global hypokinesis and  basal to mid inferior akinesis. This is a high risk study. In comparison with a prior study in 2021, the LVEF is lower (was previously 35%), however, there are no new reversible perfusion defects.    Laboratory Data:  High Sensitivity Troponin:   Recent Labs  Lab 01/29/22 2150 01/29/22 2344 01/30/22 0522  TROPONINIHS 30* 31* 32*     Chemistry Recent Labs  Lab 01/29/22 2150 01/30/22 0522  NA 138 136  K 3.8 4.1  CL 105 104  CO2 24 23  GLUCOSE 137* 245*  BUN 46* 48*  CREATININE 4.11* 4.19*  CALCIUM 8.5* 8.3*  MG  --  2.0  GFRNONAA 15* 15*  ANIONGAP 9 9    Recent Labs  Lab 01/29/22 2150 01/30/22 0522  PROT 6.7 6.3*  ALBUMIN 3.4* 3.3*  AST 25 17  ALT 23 22  ALKPHOS 163* 149*  BILITOT 0.4 0.4   Lipids  Recent Labs  Lab 01/30/22 0522  CHOL 98  TRIG 48  HDL 56  LDLCALC 32  CHOLHDL 1.8    Hematology Recent Labs  Lab 01/29/22 2150 01/30/22 0522  WBC 9.7 8.2  RBC 3.26* 3.61*  HGB 8.8* 9.7*  HCT 28.4* 31.3*  MCV 87.1 86.7  MCH 27.0 26.9  MCHC 31.0 31.0  RDW 16.8* 16.7*  PLT 202 210   Thyroid No results for input(s): "TSH", "FREET4" in the last 168 hours.  BNP Recent Labs  Lab 01/29/22 2150  BNP 1,906.0*    DDimer No results for input(s): "DDIMER" in the last 168 hours.   Radiology/Studies:  DG Chest Port 1 View  Result Date: 01/29/2022 CLINICAL DATA:  Shortness of breath and dizziness. History of esophageal cancer. EXAM: PORTABLE CHEST 1 VIEW COMPARISON:  01/25/2022. FINDINGS: Heart is enlarged the mediastinal contour is stable. Sternotomy wires are present over the midline. A stable right internal jugular central venous catheter is noted. There is a small right pleural effusion with mild atelectasis or infiltrate at the right lung base. The left lung is clear. No pneumothorax. No acute osseous abnormality. IMPRESSION: 1. Small right pleural effusion with atelectasis or infiltrate. 2. Cardiomegaly. Electronically Signed   By: Brett Fairy M.D.   On:  01/29/2022 22:19     Assessment and Plan:   Chest pain  Syncope CAD with hx of CABG 2020 - presented with chest pain 01/29/22, followed by syncope, history concerning for ischemic disease - Hs trop 30s flat - EKG without acute findings  - A1C 7.7%, lipid panel pending  - recommend further workup with cardiac cath  -  continue ASA, coreg, repatha; increased imdur from 15 to 80m   Chronic combined CHF - clinically euvolemic at this time - continue HD for volume management , OK to continue torsemide 440mdaily if not anuric  - GDMT: continue Coreg, hydralazine, Imdur   HTN - BP is elevated, on hydralazine 10081mID, coreg 12.5mg74mD, and imdur 15mg15mly, will increased imdur to 30mg 36my   HLD ESRD Depression  Esophageal cancer Hx of CVA - per primary team     Risk Assessment/Risk Scores:     New York Heart Association (NYHA) Functional Class NYHA Class II      For questions or updates, please contact [facility] Please consult [email or web address] for contact info under    Signed, Jacquelyn Antony ZMargie Billet8/21/2023 10:06 AM

## 2022-01-31 NOTE — Progress Notes (Signed)
[facility] Progress Note   Subjective:   Seen on HD. Tolerating well, no new concerns. Denies SOB, CP, dizziness and nausea.   Objective Vitals:   01/31/22 0830 01/31/22 0900 01/31/22 0930 01/31/22 1000  BP: (!) 154/66 (!) 157/76 (!) 163/71 (!) 159/79  Pulse: 77 72 73 71  Resp: (!) '24 18 16 16  '$ Temp:      TempSrc:      SpO2: 96% 96% 96% 97%  Weight:      Height:       Physical Exam General: Alert male in NAD Heart: RRR, no murmurs, rubs or gallops Lungs: CTA bilaterally without wheezing, rhonchi or rales Abdomen: Soft, non-distended, +BS Extremities: No edema b/l lower extremities Dialysis Access:  [facility] access, maturing LUE AVF  Additional Objective Labs: Basic Metabolic Panel: Recent Labs  Lab 01/29/22 2150 01/30/22 0522  NA 138 136  K 3.8 4.1  CL 105 104  CO2 24 23  GLUCOSE 137* 245*  BUN 46* 48*  CREATININE 4.11* 4.19*  CALCIUM 8.5* 8.3*   Liver Function Tests: Recent Labs  Lab 01/29/22 2150 01/30/22 0522  AST 25 17  ALT 23 22  ALKPHOS 163* 149*  BILITOT 0.4 0.4  PROT 6.7 6.3*  ALBUMIN 3.4* 3.3*   No results for input(s): "LIPASE", "AMYLASE" in the last 168 hours. CBC: Recent Labs  Lab 01/29/22 2150 01/30/22 0522  WBC 9.7 8.2  NEUTROABS 7.7  --   HGB 8.8* 9.7*  HCT 28.4* 31.3*  MCV 87.1 86.7  PLT 202 210   Blood Culture    Component Value Date/Time   SDES  08/29/2021 1809    URINE, CLEAN CATCH Performed at [facility], [address], [place], [place] [address]    [place]  08/29/2021 1809    NONE Performed at [facility], [address], [place], [place] [address]    CULT  08/29/2021 1809    NO GROWTH Performed at [facility] [address], [place], [place] [address]    REPTSTATUS 08/31/2021 FINAL 08/29/2021 1809    Cardiac Enzymes: No results for input(s): "CKTOTAL", "CKMB", "CKMBINDEX", "TROPONINI" in the last 168 hours. CBG: Recent Labs  Lab 01/30/22 0758 01/30/22 1145 01/30/22 1650  01/31/22 0554  GLUCAP 208* 208* 277* 167*   Iron Studies: No results for input(s): "IRON", "TIBC", "TRANSFERRIN", "FERRITIN" in the last 72 hours. '@lablastinr3'$ @ Studies/Results: DG Chest Port 1 View  Result Date: 01/29/2022 CLINICAL DATA:  Shortness of breath and dizziness. History of esophageal cancer. EXAM: PORTABLE CHEST 1 VIEW COMPARISON:  01/25/2022. FINDINGS: Heart is enlarged the mediastinal contour is stable. Sternotomy wires are present over the midline. A stable right internal jugular central venous catheter is noted. There is a small right pleural effusion with mild atelectasis or infiltrate at the right lung base. The left lung is clear. No pneumothorax. No acute osseous abnormality. IMPRESSION: 1. Small right pleural effusion with atelectasis or infiltrate. 2. Cardiomegaly. Electronically Signed   By: Brett Fairy M.D.   On: 01/29/2022 22:19   Medications:   calcitRIOL  0.25 mcg Oral Q M,W,F-HD   calcium acetate  667 mg Oral TID WC   carvedilol  12.5 mg Oral BID   Chlorhexidine Gluconate Cloth  6 each Topical Q0600   doxazosin  4 mg Oral Daily   heparin  5,000 Units Subcutaneous Q8H   hydrALAZINE  100 mg Oral TID   isosorbide mononitrate  15 mg Oral Daily   pantoprazole  40 mg Oral Daily  sertraline  150 mg Oral QHS   sucralfate  1 g Oral TID AC    OP Dialysis Orders:  MWF [facility] 4h  300/600  80.5kg  3k/2.5Ca bath  Heparin none  RIJ TDC (maturing LUA AVF created on 12/21/21) - last hep B labs: none - mircera 30 ug q2 , last 8/18, due early Sept - iron sucrose '100mg'$  q hd IV thru 8/21 (finished) - calcitriol 0.25 ug tiw po - last HD 8/18, post wt 80.3    BNP 1906, CXR no edema, K 4.1 Creat 4.2 BUN 48  alb 3.3  trop 31, 32    WBC 8k  Hb 9.7    Assessment/Plan: Chest pain/ hx CAD sp CABG - troponins are not high. Admit for chest pain observation.  ESRD - on HD MWF. Tolerating dialysis well, continue MWF schedule.  HTN/ vol - BP and weights elevated. No volume  overload on exam. Tolerating max UF today, continue carvedilol, doxazosin, hydralazine and imdur.  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 SP OP IV fe load completed 8/21.  MBD ckd - CCa controlled, continue calcitriol. Check phos with next labs, continue calcium acetate.  Depression -on SSRI GI - taking carafate which should not be used long-term in HD pts (alum toxicity). On protonix, try to wean carafate    Anice Paganini, PA-C 01/31/2022, 10:28 AM  [facility] Pager: [phone]

## 2022-02-01 ENCOUNTER — Encounter (HOSPITAL_COMMUNITY): Payer: Self-pay | Admitting: Cardiology

## 2022-02-01 ENCOUNTER — Inpatient Hospital Stay: Payer: Medicare HMO | Admitting: Family Medicine

## 2022-02-01 ENCOUNTER — Encounter (HOSPITAL_COMMUNITY)
Admission: EM | Disposition: A | Discharge status: Skilled Nursing Facility | Payer: Self-pay | Source: Skilled Nursing Facility | Attending: Internal Medicine

## 2022-02-01 DIAGNOSIS — Z951 Presence of aortocoronary bypass graft: Secondary | ICD-10-CM

## 2022-02-01 DIAGNOSIS — I25110 Atherosclerotic heart disease of native coronary artery with unstable angina pectoris: Secondary | ICD-10-CM

## 2022-02-01 DIAGNOSIS — I2510 Atherosclerotic heart disease of native coronary artery without angina pectoris: Secondary | ICD-10-CM

## 2022-02-01 DIAGNOSIS — I25810 Atherosclerosis of coronary artery bypass graft(s) without angina pectoris: Secondary | ICD-10-CM

## 2022-02-01 DIAGNOSIS — Z8501 Personal history of malignant neoplasm of esophagus: Secondary | ICD-10-CM

## 2022-02-01 DIAGNOSIS — N186 End stage renal disease: Secondary | ICD-10-CM | POA: Diagnosis not present

## 2022-02-01 DIAGNOSIS — E785 Hyperlipidemia, unspecified: Secondary | ICD-10-CM

## 2022-02-01 DIAGNOSIS — I200 Unstable angina: Secondary | ICD-10-CM | POA: Diagnosis not present

## 2022-02-01 DIAGNOSIS — I5042 Chronic combined systolic (congestive) and diastolic (congestive) heart failure: Secondary | ICD-10-CM

## 2022-02-01 DIAGNOSIS — Z8673 Personal history of transient ischemic attack (TIA), and cerebral infarction without residual deficits: Secondary | ICD-10-CM

## 2022-02-01 DIAGNOSIS — I25708 Atherosclerosis of coronary artery bypass graft(s), unspecified, with other forms of angina pectoris: Secondary | ICD-10-CM | POA: Diagnosis not present

## 2022-02-01 DIAGNOSIS — E119 Type 2 diabetes mellitus without complications: Secondary | ICD-10-CM

## 2022-02-01 DIAGNOSIS — Z992 Dependence on renal dialysis: Secondary | ICD-10-CM | POA: Diagnosis not present

## 2022-02-01 DIAGNOSIS — I132 Hypertensive heart and chronic kidney disease with heart failure and with stage 5 chronic kidney disease, or end stage renal disease: Secondary | ICD-10-CM

## 2022-02-01 DIAGNOSIS — E1122 Type 2 diabetes mellitus with diabetic chronic kidney disease: Secondary | ICD-10-CM

## 2022-02-01 HISTORY — PX: LEFT HEART CATH AND CORS/GRAFTS ANGIOGRAPHY: CATH118250

## 2022-02-01 LAB — BASIC METABOLIC PANEL
Anion gap: 8 (ref 5–15)
BUN: 36 mg/dL — ABNORMAL HIGH (ref 8–23)
CO2: 22 mmol/L (ref 22–32)
Calcium: 8.1 mg/dL — ABNORMAL LOW (ref 8.9–10.3)
Chloride: 102 mmol/L (ref 98–111)
Creatinine, Ser: 3.65 mg/dL — ABNORMAL HIGH (ref 0.61–1.24)
GFR, Estimated: 18 mL/min — ABNORMAL LOW (ref >60)
Glucose, Bld: 190 mg/dL — ABNORMAL HIGH (ref 70–99)
Potassium: 3.8 mmol/L (ref 3.5–5.1)
Sodium: 132 mmol/L — ABNORMAL LOW (ref 135–145)

## 2022-02-01 LAB — MAGNESIUM: Magnesium: 1.7 mg/dL (ref 1.7–2.4)

## 2022-02-01 LAB — GLUCOSE, CAPILLARY
Glucose-Capillary: 155 mg/dL — ABNORMAL HIGH (ref 70–99)
Glucose-Capillary: 104 mg/dL — ABNORMAL HIGH (ref 70–99)
Glucose-Capillary: 214 mg/dL — ABNORMAL HIGH (ref 70–99)

## 2022-02-01 LAB — HEPATITIS B SURFACE ANTIBODY, QUANTITATIVE: Hep B S AB Quant (Post): <3.1 m[IU]/mL — ABNORMAL LOW (ref >9.9)

## 2022-02-01 SURGERY — LEFT HEART CATH AND CORS/GRAFTS ANGIOGRAPHY
Anesthesia: LOCAL

## 2022-02-01 MED ORDER — MIDAZOLAM HCL 2 MG/2ML IJ SOLN
INTRAMUSCULAR | Status: AC
  Filled 2022-02-01: qty 2

## 2022-02-01 MED ORDER — FENTANYL CITRATE (PF) 100 MCG/2ML IJ SOLN
INTRAMUSCULAR | Status: AC
  Filled 2022-02-01: qty 2

## 2022-02-01 MED ORDER — CHLORHEXIDINE GLUCONATE CLOTH 2 % EX PADS
6.0000 | MEDICATED_PAD | Freq: Every day | CUTANEOUS | Status: DC
  Administered 2022-02-02 – 2022-02-04 (×3): 6 via TOPICAL

## 2022-02-01 MED ORDER — SODIUM CHLORIDE 0.9 % IV SOLN: 250.0000 mL | INTRAVENOUS | Status: DC | PRN

## 2022-02-01 MED ORDER — HEPARIN (PORCINE) IN NACL 1000-0.9 UT/500ML-% IV SOLN
INTRAVENOUS | Status: DC | PRN
  Administered 2022-02-01 (×2): 500 mL

## 2022-02-01 MED ORDER — MAGNESIUM OXIDE -MG SUPPLEMENT 400 (240 MG) MG PO TABS
800.0000 mg | ORAL_TABLET | Freq: Once | ORAL | Status: AC
  Administered 2022-02-01: 800 mg via ORAL
  Filled 2022-02-01 (×2): qty 2

## 2022-02-01 MED ORDER — MIDAZOLAM HCL 2 MG/2ML IJ SOLN
INTRAMUSCULAR | Status: DC | PRN
  Administered 2022-02-01: 1 mg via INTRAVENOUS

## 2022-02-01 MED ORDER — LIDOCAINE HCL (PF) 1 % IJ SOLN
INTRAMUSCULAR | Status: DC | PRN
  Administered 2022-02-01: 15 mL

## 2022-02-01 MED ORDER — HEPARIN SODIUM (PORCINE) 5000 UNIT/ML IJ SOLN
5000.0000 [IU] | Freq: Three times a day (TID) | INTRAMUSCULAR | Status: DC
  Administered 2022-02-01 – 2022-02-04 (×9): 5000 [IU] via SUBCUTANEOUS
  Filled 2022-02-01 (×9): qty 1

## 2022-02-01 MED ORDER — LIDOCAINE HCL (PF) 1 % IJ SOLN
INTRAMUSCULAR | Status: AC
Start: 2022-02-01 — End: ?
  Filled 2022-02-01: qty 30

## 2022-02-01 MED ORDER — HYDRALAZINE HCL 20 MG/ML IJ SOLN: 10.0000 mg | INTRAMUSCULAR | Status: DC | PRN

## 2022-02-01 MED ORDER — IOHEXOL 350 MG/ML SOLN
INTRAVENOUS | Status: DC | PRN
  Administered 2022-02-01: 80 mL

## 2022-02-01 MED ORDER — HEPARIN (PORCINE) IN NACL 1000-0.9 UT/500ML-% IV SOLN
INTRAVENOUS | Status: AC
  Filled 2022-02-01: qty 1000

## 2022-02-01 MED ORDER — SODIUM CHLORIDE 0.9% FLUSH
3.0000 mL | Freq: Two times a day (BID) | INTRAVENOUS | Status: DC
  Administered 2022-02-01 – 2022-02-04 (×5): 3 mL via INTRAVENOUS

## 2022-02-01 MED ORDER — SODIUM CHLORIDE 0.9% FLUSH: 3.0000 mL | INTRAVENOUS | Status: DC | PRN

## 2022-02-01 MED ORDER — HYDRALAZINE HCL 20 MG/ML IJ SOLN: 10.0000 mg | INTRAMUSCULAR | Status: AC | PRN

## 2022-02-01 MED ORDER — FENTANYL CITRATE (PF) 100 MCG/2ML IJ SOLN
INTRAMUSCULAR | Status: DC | PRN
  Administered 2022-02-01: 25 ug via INTRAVENOUS

## 2022-02-01 SURGICAL SUPPLY — 9 items
CATH EXPO 5F MPA-1 (CATHETERS) IMPLANT
CATH INFINITI 5FR MULTPACK ANG (CATHETERS) IMPLANT
CLOSURE MYNX CONTROL 5F (Vascular Products) IMPLANT
KIT HEART LEFT (KITS) ×1 IMPLANT
PACK CARDIAC CATHETERIZATION (CUSTOM PROCEDURE TRAY) ×1 IMPLANT
SHEATH PINNACLE 5F 10CM (SHEATH) IMPLANT
TRANSDUCER W/STOPCOCK (MISCELLANEOUS) ×1 IMPLANT
TUBING CIL FLEX 10 FLL-RA (TUBING) ×1 IMPLANT
WIRE EMERALD 3MM-J .035X150CM (WIRE) IMPLANT

## 2022-02-01 NOTE — Progress Notes (Signed)
[name] Progress Note   Subjective:   Pt had an episode of tachycardia during HD yesterday which resolved quickly with pausing UF. He denies any chest pain or palpitations this AM. No SOB. Reports nausea "off and on." For heart cath later today.   Objective Vitals:   01/31/22 1156 01/31/22 1331 01/31/22 1935 02/01/22 0834  BP: (!) 156/77  116/70 (!) 146/77  Pulse: 79 81 79   Resp:   18   Temp: 97.8 F (36.6 C)  97.8 F (36.6 C) 98.3 F (36.8 C)  TempSrc: Oral  Oral Oral  SpO2: 100%  100%   Weight:      Height:       Physical Exam General: Alert male in NAD Heart: RRR, no murmurs, rubs or gallops Lungs: CTA bilaterally without wheezing, rhonchi or rales Abdomen: Soft, non-tender, non-distended, +BS Extremities: No edema b/l lower extremities Dialysis Access:  TDC, maturing LUE AVF + bruit  Additional Objective Labs: Basic Metabolic Panel: Recent Labs  Lab 01/31/22 0758 01/31/22 1330 02/01/22 0353  NA 135 135 132*  K 4.3 3.5 3.8  CL 103 98 102  CO2 20* 26 22  GLUCOSE 215* 235* 190*  BUN 51* 25* 36*  CREATININE 4.72* 2.88* 3.65*  CALCIUM 8.8* 8.5* 8.1*  PHOS 4.2  4.2 2.5  --    Liver Function Tests: Recent Labs  Lab 01/29/22 2150 01/30/22 0522 01/31/22 0758 01/31/22 1330  AST 25 17  --   --   ALT 23 22  --   --   ALKPHOS 163* 149*  --   --   BILITOT 0.4 0.4  --   --   PROT 6.7 6.3*  --   --   ALBUMIN 3.4* 3.3* 3.3* 3.3*   No results for input(s): "LIPASE", "AMYLASE" in the last 168 hours. CBC: Recent Labs  Lab 01/29/22 2150 01/30/22 0522 01/31/22 0758 01/31/22 1336  WBC 9.7 8.2 6.3 5.3  NEUTROABS 7.7  --   --   --   HGB 8.8* 9.7* 9.0* 9.1*  HCT 28.4* 31.3* 28.7* 28.1*  MCV 87.1 86.7 85.9 82.9  PLT 202 210 197 207   Blood Culture    Component Value Date/Time   SDES  08/29/2021 1809    URINE, CLEAN CATCH Performed at [facility], [address], [place], [place] [address]    [place]  08/29/2021 1809     NONE Performed at [facility], [address] [place], [place] [address]    [place]  08/29/2021 1809    NO GROWTH Performed at [facility] [address], [place], [place] [address]    REPTSTATUS 08/31/2021 FINAL 08/29/2021 1809    Cardiac Enzymes: No results for input(s): "CKTOTAL", "CKMB", "CKMBINDEX", "TROPONINI" in the last 168 hours. CBG: Recent Labs  Lab 01/30/22 1650 01/31/22 0554 01/31/22 1349 01/31/22 1645 01/31/22 2238  GLUCAP 277* 167* 234* 280* 208*   Iron Studies: No results for input(s): "IRON", "TIBC", "TRANSFERRIN", "FERRITIN" in the last 72 hours. '@lablastinr3'$ @ Studies/Results: No results found. Medications:  sodium chloride     sodium chloride     sodium chloride     sodium chloride 10 mL/hr at 02/01/22 9381    calcitRIOL  0.25 mcg Oral Q M,W,F-HD   calcium acetate  667 mg Oral TID WC   carvedilol  12.5 mg Oral BID   Chlorhexidine Gluconate Cloth  6 each Topical Q0600   doxazosin  4 mg Oral Daily   heparin  5,000 Units  Subcutaneous Q8H   hydrALAZINE  100 mg Oral TID   insulin aspart  0-9 Units Subcutaneous TID WC   isosorbide mononitrate  30 mg Oral Daily   magnesium oxide  800 mg Oral Once   pantoprazole  40 mg Oral Daily   sertraline  150 mg Oral QHS   sodium chloride flush  3 mL Intravenous Q12H   sodium chloride flush  3 mL Intravenous Q12H    Dialysis Orders: MWF [facility] 4h  300/600  80.5kg  3k/2.5Ca bath  Heparin none  RIJ TDC (maturing LUA AVF created on 12/21/21) - last hep B labs: none - mircera 30 ug q2 , last 8/18, due early Sept - iron sucrose '100mg'$  q hd IV thru 8/21 (finished) - calcitriol 0.25 ug tiw po - last HD 8/18, post wt 80.3    BNP 1906, CXR no edema, K 4.1 Creat 4.2 BUN 48  alb 3.3  trop 31, 32    WBC 8k  Hb 9.7   Assessment/Plan: Chest pain/ hx CAD sp CABG - cardiology following, plan for heart cath today ESRD - on HD MWF. Did have an episode on tachycardia on HD but resolved quickly with stopping UF.  Will reduce UF goal with HD tomorrow.  HTN/ vol - BP and weights elevated but improved today, getting close to his EDW. continue carvedilol, doxazosin, hydralazine and imdur.  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 SP OP IV fe load completed 8/21.  MBD ckd - CCa controlled, continue calcitriol. Phos 2.5, will hold calcium acetate for now and follow trend.  Depression -on SSRI GI - was taking carafate but not recommended long term for HD patients. Now on protonix.   Anice Paganini, PA-C 02/01/2022, 10:28 AM  [name] Pager: [phone]

## 2022-02-01 NOTE — Interval H&P Note (Signed)
History and Physical Interval Note:  02/01/2022 12:26 PM  [name]  has presented today for surgery, with the diagnosis of chest pain.  The various methods of treatment have been discussed with the patient and family. After consideration of risks, benefits and other options for treatment, the patient has consented to  Procedure(s): LEFT HEART CATH AND CORS/GRAFTS ANGIOGRAPHY (N/A) as a surgical intervention.  The patient's history has been reviewed, patient examined, no change in status, stable for surgery.  I have reviewed the patient's chart and labs.  Questions were answered to the patient's satisfaction.    Cath Lab Visit (complete for each Cath Lab visit)  Clinical Evaluation Leading to the Procedure:   ACS: Yes.    Non-ACS:    Anginal Classification: CCS III  Anti-ischemic medical therapy: Maximal Therapy (2 or more classes of medications)  Non-Invasive Test Results: No non-invasive testing performed  Prior CABG: Previous CABG       Collier Salina [facility] 02/01/2022 12:27 PM

## 2022-02-01 NOTE — Progress Notes (Signed)
Pt receives out-pt HD at [facility] on MWF. Pt has a 10:05 chair time. Will assist as needed.   Melven Sartorius Renal Navigator [phone]

## 2022-02-01 NOTE — Progress Notes (Signed)
PROGRESS NOTE    [name]  TML:[id number] DOB: 06-20-1955 DOA: 01/29/2022 PCP: [name], MD   Brief Narrative:  66 year old with history of DM2, HLD, prior history of CABG, CKD stage V on HD MWF status post AV fistula placement admitted for chest pain.  Troponins were flat, EKG was nonischemic but given symptoms and history he was transferred to [facility] for further cardiac evaluation.  Cardiology recommended LHC.   Assessment & Plan:  Principal Problem:   Chest pain Active Problems:   Essential hypertension, benign   Mixed hyperlipidemia   Coronary artery disease involving coronary bypass graft of native heart with other forms of angina pectoris (HCC)   GERD (gastroesophageal reflux disease)   CHF (congestive heart failure) (HCC)   ESRD on dialysis (HCC)   Depression   Syncope and collapse    Chest pain CAD status post CABG -Given symptomatology and risk factors with previous history of CABG he has been transferred to [facility] for further cardiac evaluation.  LDL 32, A1c 7.7.  Cardiology recommending left heart cath today  ESRD on dialysis [facility]) MWF Getting HD per Nephro.    Congestive heart failure with reduced EF, EF 40% Slightly elevated BNP chest x-ray showing cardiomegaly but no pulmonary edema.  Hemodialysis should help.  Continue Coreg, Imdur   Essential hypertension, benign - Continue hydralazine, Coreg  Diabetes mellitus type 2 - A1c 7.7.  ISS/Accuchecks.  Diabetic coordinator consulted   Depression - Continue Zoloft    GERD (gastroesophageal reflux disease) - Continue Protonix   Mixed hyperlipidemia - Resume Repatha at discharge    DVT prophylaxis: Subcu heparin Code Status: Full code Family Communication:    Patient seen and examined at bedside, cardiology planning on left heart catheterization  Subjective: Seen and examined at bedside, sitting up in the chair, no complaints.  Examination: Constitutional:  Not in acute distress Respiratory: Clear to auscultation bilaterally Cardiovascular: Normal sinus rhythm, no rubs Abdomen: Nontender nondistended good bowel sounds Musculoskeletal: No edema noted Skin: No rashes seen Neurologic: CN 2-12 grossly intact.  And nonfocal Psychiatric: Normal judgment and insight. Alert and oriented x 3. Normal mood.  Right subclavian HD catheter in place Maturing left upper extremity AV fistula   Objective: Vitals:   01/31/22 1137 01/31/22 1156 01/31/22 1331 01/31/22 1935  BP:  (!) 156/77  116/70  Pulse:  79 81 79  Resp:    18  Temp:  97.8 F (36.6 C)  97.8 F (36.6 C)  TempSrc:  Oral  Oral  SpO2:  100%  100%  Weight: 81.3 kg     Height:       No intake or output data in the 24 hours ending 02/01/22 0826 Filed Weights   01/30/22 1817 01/31/22 1137  Weight: 85.1 kg 81.3 kg     Data Reviewed:   CBC: Recent Labs  Lab 01/29/22 2150 01/30/22 0522 01/31/22 0758 01/31/22 1336  WBC 9.7 8.2 6.3 5.3  NEUTROABS 7.7  --   --   --   HGB 8.8* 9.7* 9.0* 9.1*  HCT 28.4* 31.3* 28.7* 28.1*  MCV 87.1 86.7 85.9 82.9  PLT 202 210 197 681   Basic Metabolic Panel: Recent Labs  Lab 01/29/22 2150 01/30/22 0522 01/31/22 0758 01/31/22 1330 02/01/22 0353  NA 138 136 135 135 132*  K 3.8 4.1 4.3 3.5 3.8  CL 105 104 103 98 102  CO2 24 23 20* 26 22  GLUCOSE 137* 245* 215* 235* 190*  BUN 46* 48* 51* 25* 36*  CREATININE 4.11* 4.19* 4.72* 2.88* 3.65*  CALCIUM 8.5* 8.3* 8.8* 8.5* 8.1*  MG  --  2.0  --   --  1.7  PHOS  --   --  4.2  4.2 2.5  --    GFR: Estimated Creatinine Clearance: 20.8 mL/min (A) (by C-G formula based on SCr of 3.65 mg/dL (H)). Liver Function Tests: Recent Labs  Lab 01/29/22 2150 01/30/22 0522 01/31/22 0758 01/31/22 1330  AST 25 17  --   --   ALT 23 22  --   --   ALKPHOS 163* 149*  --   --   BILITOT 0.4 0.4  --   --   PROT 6.7 6.3*  --   --   ALBUMIN 3.4* 3.3* 3.3* 3.3*   No results for input(s): "LIPASE", "AMYLASE" in  the last 168 hours. No results for input(s): "AMMONIA" in the last 168 hours. Coagulation Profile: No results for input(s): "INR", "PROTIME" in the last 168 hours. Cardiac Enzymes: No results for input(s): "CKTOTAL", "CKMB", "CKMBINDEX", "TROPONINI" in the last 168 hours. BNP (last 3 results) No results for input(s): "PROBNP" in the last 8760 hours. HbA1C: Recent Labs    01/31/22 0758  HGBA1C 7.7*   CBG: Recent Labs  Lab 01/30/22 1650 01/31/22 0554 01/31/22 1349 01/31/22 1645 01/31/22 2238  GLUCAP 277* 167* 234* 280* 208*   Lipid Profile: Recent Labs    01/30/22 0522 01/31/22 1330  CHOL 98 92  HDL 56 50  LDLCALC 32 24  TRIG 48 89  CHOLHDL 1.8 1.8   Thyroid Function Tests: No results for input(s): "TSH", "T4TOTAL", "FREET4", "T3FREE", "THYROIDAB" in the last 72 hours. Anemia Panel: No results for input(s): "VITAMINB12", "FOLATE", "FERRITIN", "TIBC", "IRON", "RETICCTPCT" in the last 72 hours. Sepsis Labs: No results for input(s): "PROCALCITON", "LATICACIDVEN" in the last 168 hours.  No results found for this or any previous visit (from the past 240 hour(s)).       Radiology Studies: No results found.      Scheduled Meds:  calcitRIOL  0.25 mcg Oral Q M,W,F-HD   calcium acetate  667 mg Oral TID WC   carvedilol  12.5 mg Oral BID   Chlorhexidine Gluconate Cloth  6 each Topical Q0600   doxazosin  4 mg Oral Daily   heparin  5,000 Units Subcutaneous Q8H   hydrALAZINE  100 mg Oral TID   insulin aspart  0-9 Units Subcutaneous TID WC   isosorbide mononitrate  30 mg Oral Daily   pantoprazole  40 mg Oral Daily   sertraline  150 mg Oral QHS   sodium chloride flush  3 mL Intravenous Q12H   sodium chloride flush  3 mL Intravenous Q12H   Continuous Infusions:  sodium chloride     sodium chloride     sodium chloride     sodium chloride 10 mL/hr at 02/01/22 0637     LOS: 1 day   Time spent= 35 mins    Yuleni Burich Arsenio Loader, MD [facility]  If  7PM-7AM, please contact night-coverage  02/01/2022, 8:26 AM

## 2022-02-01 NOTE — H&P (View-Only) (Signed)
Progress Note  Patient Name: [name] Date of Encounter: 02/01/2022  [facility] Cardiologist: Skeet Latch, MD   Subjective   Denies any CP or SOB last night. Last episode of chest pain was yesterday afternoon.   Inpatient Medications    Scheduled Meds:  calcitRIOL  0.25 mcg Oral Q M,W,F-HD   calcium acetate  667 mg Oral TID WC   carvedilol  12.5 mg Oral BID   Chlorhexidine Gluconate Cloth  6 each Topical Q0600   doxazosin  4 mg Oral Daily   heparin  5,000 Units Subcutaneous Q8H   hydrALAZINE  100 mg Oral TID   insulin aspart  0-9 Units Subcutaneous TID WC   isosorbide mononitrate  30 mg Oral Daily   magnesium oxide  800 mg Oral Once   pantoprazole  40 mg Oral Daily   sertraline  150 mg Oral QHS   sodium chloride flush  3 mL Intravenous Q12H   sodium chloride flush  3 mL Intravenous Q12H   Continuous Infusions:  sodium chloride     sodium chloride     sodium chloride     sodium chloride 10 mL/hr at 02/01/22 0637   PRN Meds: sodium chloride, sodium chloride, acetaminophen **OR** acetaminophen, guaiFENesin-dextromethorphan, hydrALAZINE, hydrOXYzine, metoprolol tartrate, morphine injection, ondansetron **OR** ondansetron (ZOFRAN) IV, oxyCODONE-acetaminophen, senna-docusate, sodium chloride flush, sodium chloride flush, traMADol, traZODone   Vital Signs    Vitals:   01/31/22 1156 01/31/22 1331 01/31/22 1935 02/01/22 0834  BP: (!) 156/77  116/70 (!) 146/77  Pulse: 79 81 79   Resp:   18   Temp: 97.8 F (36.6 C)  97.8 F (36.6 C) 98.3 F (36.8 C)  TempSrc: Oral  Oral Oral  SpO2: 100%  100%   Weight:      Height:       No intake or output data in the 24 hours ending 02/01/22 0902    01/31/2022   11:37 AM 01/30/2022    6:17 PM 01/26/2022    3:20 PM  Last 3 Weights  Weight (lbs) 179 lb 3.7 oz 187 lb 9.8 oz 182 lb  Weight (kg) 81.3 kg 85.1 kg 82.555 kg      Telemetry    NSR without significant ST-T wave changes - Personally Reviewed  ECG     NSR with TWI in the inferolateral leads - Personally Reviewed  Physical Exam   GEN: No acute distress.   Neck: No JVD Cardiac: RRR, no murmurs, rubs, or gallops.  Respiratory: Clear to auscultation bilaterally. GI: Soft, nontender, non-distended  MS: No edema; No deformity. Neuro:  Nonfocal  Psych: Normal affect   Labs    High Sensitivity Troponin:   Recent Labs  Lab 01/29/22 2150 01/29/22 2344 01/30/22 0522  TROPONINIHS 30* 31* 32*     Chemistry Recent Labs  Lab 01/29/22 2150 01/30/22 0522 01/31/22 0758 01/31/22 1330 02/01/22 0353  NA 138 136 135 135 132*  K 3.8 4.1 4.3 3.5 3.8  CL 105 104 103 98 102  CO2 24 23 20* 26 22  GLUCOSE 137* 245* 215* 235* 190*  BUN 46* 48* 51* 25* 36*  CREATININE 4.11* 4.19* 4.72* 2.88* 3.65*  CALCIUM 8.5* 8.3* 8.8* 8.5* 8.1*  MG  --  2.0  --   --  1.7  PROT 6.7 6.3*  --   --   --   ALBUMIN 3.4* 3.3* 3.3* 3.3*  --   AST 25 17  --   --   --  ALT 23 22  --   --   --   ALKPHOS 163* 149*  --   --   --   BILITOT 0.4 0.4  --   --   --   GFRNONAA 15* 15* 13* 23* 18*  ANIONGAP '9 9 12 11 8    '$ Lipids  Recent Labs  Lab 01/31/22 1330  CHOL 92  TRIG 89  HDL 50  LDLCALC 24  CHOLHDL 1.8    Hematology Recent Labs  Lab 01/30/22 0522 01/31/22 0758 01/31/22 1336  WBC 8.2 6.3 5.3  RBC 3.61* 3.34* 3.39*  HGB 9.7* 9.0* 9.1*  HCT 31.3* 28.7* 28.1*  MCV 86.7 85.9 82.9  MCH 26.9 26.9 26.8  MCHC 31.0 31.4 32.4  RDW 16.7* 16.4* 16.3*  PLT 210 197 207   Thyroid No results for input(s): "TSH", "FREET4" in the last 168 hours.  BNP Recent Labs  Lab 01/29/22 2150  BNP 1,906.0*    DDimer No results for input(s): "DDIMER" in the last 168 hours.   Radiology    No results found.  Cardiac Studies   Echo 07/25/2021 1. Left ventricular ejection fraction, by estimation, is 40 to 45%. The  left ventricle has mildly decreased function. The left ventricle  demonstrates regional wall motion abnormalities (see scoring  diagram/findings  for description). There is mild  concentric left ventricular hypertrophy. Left ventricular diastolic  function could not be evaluated. There is akinesis of the left  ventricular, basal inferior wall and inferoseptal wall.   2. Right ventricular systolic function is moderately reduced. The right  ventricular size is severely enlarged. There is moderately elevated  pulmonary artery systolic pressure. The estimated right ventricular  systolic pressure is 88.5 mmHg.   3. Left atrial size was moderately dilated.   4. The mitral valve is degenerative. Mild to moderate mitral valve  regurgitation. No evidence of mitral stenosis.   5. The aortic valve is normal in structure. Aortic valve regurgitation is  not visualized. No aortic stenosis is present.   6. Aortic dilatation noted. There is mild dilatation of the aortic root,  measuring 39 mm.   7. The inferior vena cava is dilated in size with <50% respiratory  variability, suggesting right atrial pressure of 15 mmHg.   Patient Profile     66 y.o. 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   Assessment & Plan    Chest pain  - plan to proceed with cardiac catheterization this afternoon   CAD s/p CABG 2020: on coreg, hydralazine/imdur  Chronic combined CHF: euvolemic on exam.   HTN: coreg, hydralazine/imdur  HLD: on repatha at home  DM II  ESRD on HD: followed by Dr. Moshe Cipro as outpatient. Has HD MWF. Just started on HD a month ago. Still making urine about 4-5 times ago, aware of potential effect of contrast on remaining kidney function  H/o esophageal cancer: followed by [facility], diagnosed via biopsy in 02/2021  H/o CVA      For questions or updates, please contact [facility] Please consult [email or web address] for contact info under        Signed, Almyra Deforest, [place]  02/01/2022, 9:02 AM
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
## 2022-02-01 NOTE — Progress Notes (Signed)
Progress Note  Patient Name: [name] Date of Encounter: 02/01/2022  [facility] Cardiologist: Skeet Latch, MD   Subjective   Denies any CP or SOB last night. Last episode of chest pain was yesterday afternoon.   Inpatient Medications    Scheduled Meds:  calcitRIOL  0.25 mcg Oral Q M,W,F-HD   calcium acetate  667 mg Oral TID WC   carvedilol  12.5 mg Oral BID   Chlorhexidine Gluconate Cloth  6 each Topical Q0600   doxazosin  4 mg Oral Daily   heparin  5,000 Units Subcutaneous Q8H   hydrALAZINE  100 mg Oral TID   insulin aspart  0-9 Units Subcutaneous TID WC   isosorbide mononitrate  30 mg Oral Daily   magnesium oxide  800 mg Oral Once   pantoprazole  40 mg Oral Daily   sertraline  150 mg Oral QHS   sodium chloride flush  3 mL Intravenous Q12H   sodium chloride flush  3 mL Intravenous Q12H   Continuous Infusions:  sodium chloride     sodium chloride     sodium chloride     sodium chloride 10 mL/hr at 02/01/22 0637   PRN Meds: sodium chloride, sodium chloride, acetaminophen **OR** acetaminophen, guaiFENesin-dextromethorphan, hydrALAZINE, hydrOXYzine, metoprolol tartrate, morphine injection, ondansetron **OR** ondansetron (ZOFRAN) IV, oxyCODONE-acetaminophen, senna-docusate, sodium chloride flush, sodium chloride flush, traMADol, traZODone   Vital Signs    Vitals:   01/31/22 1156 01/31/22 1331 01/31/22 1935 02/01/22 0834  BP: (!) 156/77  116/70 (!) 146/77  Pulse: 79 81 79   Resp:   18   Temp: 97.8 F (36.6 C)  97.8 F (36.6 C) 98.3 F (36.8 C)  TempSrc: Oral  Oral Oral  SpO2: 100%  100%   Weight:      Height:       No intake or output data in the 24 hours ending 02/01/22 0902    01/31/2022   11:37 AM 01/30/2022    6:17 PM 01/26/2022    3:20 PM  Last 3 Weights  Weight (lbs) 179 lb 3.7 oz 187 lb 9.8 oz 182 lb  Weight (kg) 81.3 kg 85.1 kg 82.555 kg      Telemetry    NSR without significant ST-T wave changes - Personally Reviewed  ECG     NSR with TWI in the inferolateral leads - Personally Reviewed  Physical Exam   GEN: No acute distress.   Neck: No JVD Cardiac: RRR, no murmurs, rubs, or gallops.  Respiratory: Clear to auscultation bilaterally. GI: Soft, nontender, non-distended  MS: No edema; No deformity. Neuro:  Nonfocal  Psych: Normal affect   Labs    High Sensitivity Troponin:   Recent Labs  Lab 01/29/22 2150 01/29/22 2344 01/30/22 0522  TROPONINIHS 30* 31* 32*     Chemistry Recent Labs  Lab 01/29/22 2150 01/30/22 0522 01/31/22 0758 01/31/22 1330 02/01/22 0353  NA 138 136 135 135 132*  K 3.8 4.1 4.3 3.5 3.8  CL 105 104 103 98 102  CO2 24 23 20* 26 22  GLUCOSE 137* 245* 215* 235* 190*  BUN 46* 48* 51* 25* 36*  CREATININE 4.11* 4.19* 4.72* 2.88* 3.65*  CALCIUM 8.5* 8.3* 8.8* 8.5* 8.1*  MG  --  2.0  --   --  1.7  PROT 6.7 6.3*  --   --   --   ALBUMIN 3.4* 3.3* 3.3* 3.3*  --   AST 25 17  --   --   --  ALT 23 22  --   --   --   ALKPHOS 163* 149*  --   --   --   BILITOT 0.4 0.4  --   --   --   GFRNONAA 15* 15* 13* 23* 18*  ANIONGAP '9 9 12 11 8    '$ Lipids  Recent Labs  Lab 01/31/22 1330  CHOL 92  TRIG 89  HDL 50  LDLCALC 24  CHOLHDL 1.8    Hematology Recent Labs  Lab 01/30/22 0522 01/31/22 0758 01/31/22 1336  WBC 8.2 6.3 5.3  RBC 3.61* 3.34* 3.39*  HGB 9.7* 9.0* 9.1*  HCT 31.3* 28.7* 28.1*  MCV 86.7 85.9 82.9  MCH 26.9 26.9 26.8  MCHC 31.0 31.4 32.4  RDW 16.7* 16.4* 16.3*  PLT 210 197 207   Thyroid No results for input(s): "TSH", "FREET4" in the last 168 hours.  BNP Recent Labs  Lab 01/29/22 2150  BNP 1,906.0*    DDimer No results for input(s): "DDIMER" in the last 168 hours.   Radiology    No results found.  Cardiac Studies   Echo 07/25/2021 1. Left ventricular ejection fraction, by estimation, is 40 to 45%. The  left ventricle has mildly decreased function. The left ventricle  demonstrates regional wall motion abnormalities (see scoring  diagram/findings  for description). There is mild  concentric left ventricular hypertrophy. Left ventricular diastolic  function could not be evaluated. There is akinesis of the left  ventricular, basal inferior wall and inferoseptal wall.   2. Right ventricular systolic function is moderately reduced. The right  ventricular size is severely enlarged. There is moderately elevated  pulmonary artery systolic pressure. The estimated right ventricular  systolic pressure is 91.6 mmHg.   3. Left atrial size was moderately dilated.   4. The mitral valve is degenerative. Mild to moderate mitral valve  regurgitation. No evidence of mitral stenosis.   5. The aortic valve is normal in structure. Aortic valve regurgitation is  not visualized. No aortic stenosis is present.   6. Aortic dilatation noted. There is mild dilatation of the aortic root,  measuring 39 mm.   7. The inferior vena cava is dilated in size with <50% respiratory  variability, suggesting right atrial pressure of 15 mmHg.   Patient Profile     66 y.o. 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   Assessment & Plan    Chest pain  - plan to proceed with cardiac catheterization this afternoon   CAD s/p CABG 2020: on coreg, hydralazine/imdur  Chronic combined CHF: euvolemic on exam.   HTN: coreg, hydralazine/imdur  HLD: on repatha at home  DM II  ESRD on HD: followed by Dr. Moshe Cipro as outpatient. Has HD MWF. Just started on HD a month ago. Still making urine about 4-5 times ago, aware of potential effect of contrast on remaining kidney function  H/o esophageal cancer: followed by [facility], diagnosed via biopsy in 02/2021  H/o CVA      For questions or updates, please contact [facility] Please consult [email or web address] for contact info under        Signed, Almyra Deforest, [place]  02/01/2022, 9:02 AM

## 2022-02-01 NOTE — Plan of Care (Signed)
  Problem: Education: Goal: Knowledge of General Education information will improve Description: Including pain rating scale, medication(s)/side effects and non-pharmacologic comfort measures Outcome: Progressing   Problem: Health Behavior/Discharge Planning: Goal: Ability to manage health-related needs will improve Outcome: Progressing   Problem: Clinical Measurements: Goal: Ability to maintain clinical measurements within normal limits will improve Outcome: Progressing Goal: Will remain free from infection Outcome: Progressing Goal: Diagnostic test results will improve Outcome: Progressing Goal: Respiratory complications will improve Outcome: Progressing Goal: Cardiovascular complication will be avoided Outcome: Progressing   Problem: Activity: Goal: Risk for activity intolerance will decrease Outcome: Progressing   Problem: Nutrition: Goal: Adequate nutrition will be maintained Outcome: Progressing   Problem: Coping: Goal: Level of anxiety will decrease Outcome: Progressing   Problem: Elimination: Goal: Will not experience complications related to bowel motility Outcome: Progressing Goal: Will not experience complications related to urinary retention Outcome: Progressing   Problem: Pain Managment: Goal: General experience of comfort will improve Outcome: Progressing   Problem: Safety: Goal: Ability to remain free from injury will improve Outcome: Progressing   Problem: Skin Integrity: Goal: Risk for impaired skin integrity will decrease Outcome: Progressing   Problem: Education: Goal: Understanding of CV disease, CV risk reduction, and recovery process will improve Outcome: Progressing Goal: Individualized Educational Video(s) Outcome: Progressing   Problem: Activity: Goal: Ability to return to baseline activity level will improve Outcome: Progressing   Problem: Cardiovascular: Goal: Ability to achieve and maintain adequate cardiovascular perfusion  will improve Outcome: Progressing Goal: Vascular access site(s) Level 0-1 will be maintained Outcome: Progressing   Problem: Health Behavior/Discharge Planning: Goal: Ability to safely manage health-related needs after discharge will improve Outcome: Progressing   Problem: Education: Goal: Ability to describe self-care measures that may prevent or decrease complications (Diabetes Survival Skills Education) will improve Outcome: Progressing Goal: Individualized Educational Video(s) Outcome: Progressing   Problem: Coping: Goal: Ability to adjust to condition or change in health will improve Outcome: Progressing   Problem: Fluid Volume: Goal: Ability to maintain a balanced intake and output will improve Outcome: Progressing   Problem: Health Behavior/Discharge Planning: Goal: Ability to identify and utilize available resources and services will improve Outcome: Progressing Goal: Ability to manage health-related needs will improve Outcome: Progressing   Problem: Metabolic: Goal: Ability to maintain appropriate glucose levels will improve Outcome: Progressing   Problem: Nutritional: Goal: Maintenance of adequate nutrition will improve Outcome: Progressing Goal: Progress toward achieving an optimal weight will improve Outcome: Progressing   Problem: Skin Integrity: Goal: Risk for impaired skin integrity will decrease Outcome: Progressing   Problem: Tissue Perfusion: Goal: Adequacy of tissue perfusion will improve Outcome: Progressing   

## 2022-02-02 DIAGNOSIS — E440 Moderate protein-calorie malnutrition: Secondary | ICD-10-CM | POA: Insufficient documentation

## 2022-02-02 DIAGNOSIS — N186 End stage renal disease: Secondary | ICD-10-CM | POA: Diagnosis not present

## 2022-02-02 DIAGNOSIS — Z992 Dependence on renal dialysis: Secondary | ICD-10-CM | POA: Diagnosis not present

## 2022-02-02 DIAGNOSIS — R072 Precordial pain: Secondary | ICD-10-CM

## 2022-02-02 DIAGNOSIS — I25708 Atherosclerosis of coronary artery bypass graft(s), unspecified, with other forms of angina pectoris: Secondary | ICD-10-CM | POA: Diagnosis not present

## 2022-02-02 LAB — CBC
HCT: 29.5 % — ABNORMAL LOW (ref 39.0–52.0)
Hemoglobin: 9.1 g/dL — ABNORMAL LOW (ref 13.0–17.0)
MCH: 26.3 pg (ref 26.0–34.0)
MCHC: 30.8 g/dL (ref 30.0–36.0)
MCV: 85.3 fL (ref 80.0–100.0)
Platelets: 238 10*3/uL (ref 150–400)
RBC: 3.46 MIL/uL — ABNORMAL LOW (ref 4.22–5.81)
RDW: 16.5 % — ABNORMAL HIGH (ref 11.5–15.5)
WBC: 6.3 10*3/uL (ref 4.0–10.5)
nRBC: 0 % (ref 0.0–0.2)

## 2022-02-02 LAB — MAGNESIUM: Magnesium: 1.9 mg/dL (ref 1.7–2.4)

## 2022-02-02 LAB — BASIC METABOLIC PANEL
Anion gap: 10 (ref 5–15)
BUN: 45 mg/dL — ABNORMAL HIGH (ref 8–23)
CO2: 22 mmol/L (ref 22–32)
Calcium: 8.7 mg/dL — ABNORMAL LOW (ref 8.9–10.3)
Chloride: 104 mmol/L (ref 98–111)
Creatinine, Ser: 4.7 mg/dL — ABNORMAL HIGH (ref 0.61–1.24)
GFR, Estimated: 13 mL/min — ABNORMAL LOW (ref >60)
Glucose, Bld: 218 mg/dL — ABNORMAL HIGH (ref 70–99)
Potassium: 3.9 mmol/L (ref 3.5–5.1)
Sodium: 136 mmol/L (ref 135–145)

## 2022-02-02 LAB — GLUCOSE, CAPILLARY
Glucose-Capillary: 212 mg/dL — ABNORMAL HIGH (ref 70–99)
Glucose-Capillary: 102 mg/dL — ABNORMAL HIGH (ref 70–99)
Glucose-Capillary: 325 mg/dL — ABNORMAL HIGH (ref 70–99)
Glucose-Capillary: 198 mg/dL — ABNORMAL HIGH (ref 70–99)

## 2022-02-02 MED ORDER — LIDOCAINE HCL (PF) 1 % IJ SOLN: 5.0000 mL | INTRAMUSCULAR | Status: DC | PRN

## 2022-02-02 MED ORDER — METOPROLOL TARTRATE 50 MG PO TABS
50.0000 mg | ORAL_TABLET | Freq: Two times a day (BID) | ORAL | Status: DC
  Administered 2022-02-02 – 2022-02-04 (×4): 50 mg via ORAL
  Filled 2022-02-02 (×4): qty 1

## 2022-02-02 MED ORDER — RENA-VITE PO TABS
1.0000 | ORAL_TABLET | Freq: Every day | ORAL | Status: DC
  Administered 2022-02-02 – 2022-02-03 (×2): 1 via ORAL
  Filled 2022-02-02 (×2): qty 1

## 2022-02-02 MED ORDER — POLYETHYLENE GLYCOL 3350 17 G PO PACK: 17.0000 g | PACK | Freq: Every day | ORAL | Status: DC | PRN

## 2022-02-02 MED ORDER — ALTEPLASE 2 MG IJ SOLR: 2.0000 mg | Freq: Once | INTRAMUSCULAR | Status: DC | PRN

## 2022-02-02 MED ORDER — ANTICOAGULANT SODIUM CITRATE 4% (200MG/5ML) IV SOLN: 5.0000 mL | Status: DC | PRN

## 2022-02-02 MED ORDER — BOOST PLUS PO LIQD
237.0000 mL | Freq: Two times a day (BID) | ORAL | Status: DC
  Administered 2022-02-03 – 2022-02-04 (×4): 237 mL via ORAL
  Filled 2022-02-02 (×4): qty 237

## 2022-02-02 MED ORDER — HEPARIN SODIUM (PORCINE) 1000 UNIT/ML DIALYSIS
1000.0000 [IU] | INTRAMUSCULAR | Status: DC | PRN
  Administered 2022-02-02: 1000 [IU]
  Filled 2022-02-02 (×2): qty 1

## 2022-02-02 NOTE — Progress Notes (Addendum)
Pt experienced a 2 minute episode of VTACH from 1201 pm to 1203 pm. Pt symptomatic with dizziness and light headedness. Cardiology paged. Pt in sinus rhythm now. RN observing pt and  plan of care ongoing.  Eulas Post, [place] reached out to this RN. PA explained episode not true Vtach but SVT. Medications adjusted by Cardiology team. Plan of care ongoing

## 2022-02-02 NOTE — Progress Notes (Signed)
PROGRESS NOTE    [name]  EUM:[id number] DOB: 03/23/1956 DOA: 01/29/2022 PCP: [name], MD   Brief Narrative: 66 year old with past medical history significant for diabetes type 2, hyperlipidemia, history of CABG, CKD stage V now on hemodialysis Monday Wednesday and Friday status post AV fistula placement admitted for chest pain.  Troponin were flat PE.  EKG was nonischemic.  Given his symptoms he was transferred to [facility] for further cardiac evaluation.  He underwent left heart cath which showed patent graft. He developed V. tach during hemodialysis on 8/22.  No further episodes.  Plan to transfer to skilled for rehab.  PT OT consulted.   Assessment & Plan:   Principal Problem:   Chest pain Active Problems:   Essential hypertension, benign   Mixed hyperlipidemia   Coronary artery disease involving coronary bypass graft of native heart with other forms of angina pectoris (HCC)   GERD (gastroesophageal reflux disease)   CHF (congestive heart failure) (HCC)   ESRD on dialysis (HCC)   Depression   Syncope and collapse   Malnutrition of moderate degree  1-Chest pain CAD status post CABG -Given symptomatology and risk factors with previous history of CABG he has been transferred to [facility] for further cardiac evaluation.  LDL 32, A1c 7.7.   -Cardiology  was consulted, patient underwent left heart cath 8/22, which showed patent CABG.  -Medical management   ESRD on dialysis [facility]) MWF Nephrology managing He had V. tach during hemodialysis on 8/22 He underwent  hemodialysis today with no further events   Congestive heart failure with reduced EF, EF 40% Slightly elevated BNP chest x-ray showing cardiomegaly but no pulmonary edema.  -Continue Coreg, Imdur. -Volume managed with hemodialysis   Essential hypertension, benign - Continue hydralazine, Coreg   Diabetes mellitus type 2 - A1c 7.7.  ISS/Accuchecks.  Diabetic coordinator consulted    Depression - Continue Zoloft     GERD (gastroesophageal reflux disease) - Continue Protonix   Mixed hyperlipidemia - Resume Repatha at discharge       Nutrition Problem: Moderate Malnutrition Etiology: chronic illness (ESRD on HD)    Signs/Symptoms: mild muscle depletion, moderate muscle depletion, mild fat depletion, moderate fat depletion    Interventions: MVI, Boost Plus  Estimated body mass index is 25.18 kg/m as calculated from the following:   Height as of this encounter: '5\' 10"'$  (1.778 m).   Weight as of this encounter: 79.6 kg.   DVT prophylaxis: Heparin Code Status: DNR Family Communication: Discussed with patient Disposition Plan:  Status is: Inpatient Remains inpatient appropriate because: Plan to transfer to skilled when bed available    Consultants:  Cardiology Nephrology   Procedures:  Left hearth cath.   Antimicrobials:    Subjective: He denies chest pain, he feels well.   Objective: Vitals:   02/02/22 1131 02/02/22 1133 02/02/22 1134 02/02/22 1636  BP: (!) 160/79 (!) 160/79 (!) 160/79 (!) 112/59  Pulse: 86 83    Resp:      Temp: 97.8 F (36.6 C)     TempSrc: Oral     SpO2: 94%     Weight:      Height:        Intake/Output Summary (Last 24 hours) at 02/02/2022 1740 Last data filed at 02/02/2022 1230 Gross per 24 hour  Intake --  Output 2200 ml  Net -2200 ml   Filed Weights   01/31/22 1137 02/02/22 0710 02/02/22 1100  Weight: 81.3 kg 81.9 kg 79.6 kg  Examination:  General exam: Appears calm and comfortable  Respiratory system: Clear to auscultation. Respiratory effort normal. Cardiovascular system: S1 & S2 heard, RRR.  Gastrointestinal system: Abdomen is nondistended, soft and nontender. No organomegaly or masses felt. Normal bowel sounds heard. Central nervous system: Alert and oriented.  Extremities: Symmetric 5 x 5 power.   Data Reviewed: I have personally reviewed following labs and imaging  studies  CBC: Recent Labs  Lab 01/29/22 2150 01/30/22 0522 01/31/22 0758 01/31/22 1336 02/02/22 0735  WBC 9.7 8.2 6.3 5.3 6.3  NEUTROABS 7.7  --   --   --   --   HGB 8.8* 9.7* 9.0* 9.1* 9.1*  HCT 28.4* 31.3* 28.7* 28.1* 29.5*  MCV 87.1 86.7 85.9 82.9 85.3  PLT 202 210 197 207 786   Basic Metabolic Panel: Recent Labs  Lab 01/30/22 0522 01/31/22 0758 01/31/22 1330 02/01/22 0353 02/02/22 0417  NA 136 135 135 132* 136  K 4.1 4.3 3.5 3.8 3.9  CL 104 103 98 102 104  CO2 23 20* '26 22 22  '$ GLUCOSE 245* 215* 235* 190* 218*  BUN 48* 51* 25* 36* 45*  CREATININE 4.19* 4.72* 2.88* 3.65* 4.70*  CALCIUM 8.3* 8.8* 8.5* 8.1* 8.7*  MG 2.0  --   --  1.7 1.9  PHOS  --  4.2  4.2 2.5  --   --    GFR: Estimated Creatinine Clearance: 16.2 mL/min (A) (by C-G formula based on SCr of 4.7 mg/dL (H)). Liver Function Tests: Recent Labs  Lab 01/29/22 2150 01/30/22 0522 01/31/22 0758 01/31/22 1330  AST 25 17  --   --   ALT 23 22  --   --   ALKPHOS 163* 149*  --   --   BILITOT 0.4 0.4  --   --   PROT 6.7 6.3*  --   --   ALBUMIN 3.4* 3.3* 3.3* 3.3*   No results for input(s): "LIPASE", "AMYLASE" in the last 168 hours. No results for input(s): "AMMONIA" in the last 168 hours. Coagulation Profile: No results for input(s): "INR", "PROTIME" in the last 168 hours. Cardiac Enzymes: No results for input(s): "CKTOTAL", "CKMB", "CKMBINDEX", "TROPONINI" in the last 168 hours. BNP (last 3 results) No results for input(s): "PROBNP" in the last 8760 hours. HbA1C: Recent Labs    01/31/22 0758  HGBA1C 7.7*   CBG: Recent Labs  Lab 02/01/22 1128 02/01/22 1656 02/01/22 2149 02/02/22 1129 02/02/22 1545  GLUCAP 104* 214* 212* 102* 325*   Lipid Profile: Recent Labs    01/31/22 1330  CHOL 92  HDL 50  LDLCALC 24  TRIG 89  CHOLHDL 1.8   Thyroid Function Tests: No results for input(s): "TSH", "T4TOTAL", "FREET4", "T3FREE", "THYROIDAB" in the last 72 hours. Anemia Panel: No results for  input(s): "VITAMINB12", "FOLATE", "FERRITIN", "TIBC", "IRON", "RETICCTPCT" in the last 72 hours. Sepsis Labs: No results for input(s): "PROCALCITON", "LATICACIDVEN" in the last 168 hours.  No results found for this or any previous visit (from the past 240 hour(s)).       Radiology Studies: CARDIAC CATHETERIZATION  Result Date: 02/01/2022   Mid LM to Dist LM lesion is 25% stenosed.   Ost LAD to Prox LAD lesion is 50% stenosed.   Ost Cx to Prox Cx lesion is 90% stenosed.   Dist RCA-1 lesion is 90% stenosed.   Mid RCA lesion is 75% stenosed.   1st Diag lesion is 80% stenosed.   2nd Diag lesion is 75% stenosed.   1st Mrg  lesion is 80% stenosed.   2nd Mrg lesion is 80% stenosed.   Mid LAD lesion is 100% stenosed.   RPAV lesion is 80% stenosed.   Dist RCA-2 lesion is 100% stenosed.   SVG graft was visualized by angiography and is normal in caliber.   SVG graft was visualized by angiography and is normal in caliber.   LIMA graft was visualized by angiography and is large.   SVG graft was visualized by angiography and is normal in caliber.   The graft exhibits no disease.   The graft exhibits no disease.   The graft exhibits no disease.   The graft exhibits no disease.   LV end diastolic pressure is mildly elevated. Severe 3 vessel obstructive CAD Patent LIMA to the LAD Patent SVG to large second diagonal Patent SVG to OM Patent SVG to PL branch of the RCA Elevated LVEDP 25 mm Hg Plan: continue medical management.        Scheduled Meds:  calcitRIOL  0.25 mcg Oral Q M,W,F-HD   Chlorhexidine Gluconate Cloth  6 each Topical Q0600   Chlorhexidine Gluconate Cloth  6 each Topical Q0600   doxazosin  4 mg Oral Daily   heparin  5,000 Units Subcutaneous Q8H   hydrALAZINE  100 mg Oral TID   insulin aspart  0-9 Units Subcutaneous TID WC   isosorbide mononitrate  30 mg Oral Daily   [START ON 02/03/2022] lactose free nutrition  237 mL Oral BID BM   metoprolol tartrate  50 mg Oral BID   multivitamin  1 tablet  Oral QHS   pantoprazole  40 mg Oral Daily   sertraline  150 mg Oral QHS   sodium chloride flush  3 mL Intravenous Q12H   sodium chloride flush  3 mL Intravenous Q12H   sodium chloride flush  3 mL Intravenous Q12H   Continuous Infusions:  sodium chloride       LOS: 2 days    Time spent: 35 minutes    Saabir Blyth A Nikkole Placzek, MD [facility]   If 7PM-7AM, please contact night-coverage [email or web address]  02/02/2022, 5:40 PM

## 2022-02-02 NOTE — Progress Notes (Signed)
Progress Note  Patient Name: [name] Date of Encounter: 02/02/2022  [facility] Cardiologist: Skeet Latch, MD   Subjective   Denies any significant chest discomfort. Getting dialysis.  Inpatient Medications    Scheduled Meds:  calcitRIOL  0.25 mcg Oral Q M,W,F-HD   carvedilol  12.5 mg Oral BID   Chlorhexidine Gluconate Cloth  6 each Topical Q0600   Chlorhexidine Gluconate Cloth  6 each Topical Q0600   doxazosin  4 mg Oral Daily   heparin  5,000 Units Subcutaneous Q8H   hydrALAZINE  100 mg Oral TID   insulin aspart  0-9 Units Subcutaneous TID WC   isosorbide mononitrate  30 mg Oral Daily   pantoprazole  40 mg Oral Daily   sertraline  150 mg Oral QHS   sodium chloride flush  3 mL Intravenous Q12H   sodium chloride flush  3 mL Intravenous Q12H   sodium chloride flush  3 mL Intravenous Q12H   Continuous Infusions:  sodium chloride     PRN Meds: sodium chloride, acetaminophen **OR** acetaminophen, guaiFENesin-dextromethorphan, hydrALAZINE, hydrOXYzine, metoprolol tartrate, morphine injection, ondansetron **OR** ondansetron (ZOFRAN) IV, oxyCODONE-acetaminophen, senna-docusate, sodium chloride flush, traMADol, traZODone   Vital Signs    Vitals:   02/02/22 0631 02/02/22 0710 02/02/22 0720 02/02/22 0730  BP: 120/66 (!) 159/73 (!) 153/72 (!) 151/76  Pulse: 70 74 68 74  Resp: '16 15 16 '$ (!) 21  Temp: 97.7 F (36.5 C) 97.9 F (36.6 C)    TempSrc: Oral Oral    SpO2: 98% 96% 97% 99%  Weight:  81.9 kg    Height:        Intake/Output Summary (Last 24 hours) at 02/02/2022 0759 Last data filed at 02/01/2022 1200 Gross per 24 hour  Intake --  Output 500 ml  Net -500 ml      02/02/2022    7:10 AM 01/31/2022   11:37 AM 01/30/2022    6:17 PM  Last 3 Weights  Weight (lbs) 180 lb 8.9 oz 179 lb 3.7 oz 187 lb 9.8 oz  Weight (kg) 81.9 kg 81.3 kg 85.1 kg      Telemetry    NSR without significant ventricular ectopy, single episode of SVT with HR 160s during  dialysis on 8/21, lasted only 1 min. - Personally Reviewed  ECG    NSR without significant ST-T wave changes - Personally Reviewed  Physical Exam   GEN: No acute distress.   Neck: No JVD Cardiac: RRR, no murmurs, rubs, or gallops. R femoral cath site soft, covered with dressing, no bleeding or hematoma.  Respiratory: Clear to auscultation bilaterally. GI: Soft, nontender, non-distended  MS: No edema; No deformity. Neuro:  Nonfocal  Psych: Normal affect   Labs    High Sensitivity Troponin:   Recent Labs  Lab 01/29/22 2150 01/29/22 2344 01/30/22 0522  TROPONINIHS 30* 31* 32*     Chemistry Recent Labs  Lab 01/29/22 2150 01/30/22 0522 01/31/22 0758 01/31/22 1330 02/01/22 0353 02/02/22 0417  NA 138 136 135 135 132* 136  K 3.8 4.1 4.3 3.5 3.8 3.9  CL 105 104 103 98 102 104  CO2 24 23 20* '26 22 22  '$ GLUCOSE 137* 245* 215* 235* 190* 218*  BUN 46* 48* 51* 25* 36* 45*  CREATININE 4.11* 4.19* 4.72* 2.88* 3.65* 4.70*  CALCIUM 8.5* 8.3* 8.8* 8.5* 8.1* 8.7*  MG  --  2.0  --   --  1.7 1.9  PROT 6.7 6.3*  --   --   --   --  ALBUMIN 3.4* 3.3* 3.3* 3.3*  --   --   AST 25 17  --   --   --   --   ALT 23 22  --   --   --   --   ALKPHOS 163* 149*  --   --   --   --   BILITOT 0.4 0.4  --   --   --   --   GFRNONAA 15* 15* 13* 23* 18* 13*  ANIONGAP '9 9 12 11 8 10    '$ Lipids  Recent Labs  Lab 01/31/22 1330  CHOL 92  TRIG 89  HDL 50  LDLCALC 24  CHOLHDL 1.8    Hematology Recent Labs  Lab 01/31/22 0758 01/31/22 1336 02/02/22 0735  WBC 6.3 5.3 6.3  RBC 3.34* 3.39* 3.46*  HGB 9.0* 9.1* 9.1*  HCT 28.7* 28.1* 29.5*  MCV 85.9 82.9 85.3  MCH 26.9 26.8 26.3  MCHC 31.4 32.4 30.8  RDW 16.4* 16.3* 16.5*  PLT 197 207 238   Thyroid No results for input(s): "TSH", "FREET4" in the last 168 hours.  BNP Recent Labs  Lab 01/29/22 2150  BNP 1,906.0*    DDimer No results for input(s): "DDIMER" in the last 168 hours.   Radiology    CARDIAC CATHETERIZATION  Result Date:  02/01/2022   Mid LM to Dist LM lesion is 25% stenosed.   Ost LAD to Prox LAD lesion is 50% stenosed.   Ost Cx to Prox Cx lesion is 90% stenosed.   Dist RCA-1 lesion is 90% stenosed.   Mid RCA lesion is 75% stenosed.   1st Diag lesion is 80% stenosed.   2nd Diag lesion is 75% stenosed.   1st Mrg lesion is 80% stenosed.   2nd Mrg lesion is 80% stenosed.   Mid LAD lesion is 100% stenosed.   RPAV lesion is 80% stenosed.   Dist RCA-2 lesion is 100% stenosed.   SVG graft was visualized by angiography and is normal in caliber.   SVG graft was visualized by angiography and is normal in caliber.   LIMA graft was visualized by angiography and is large.   SVG graft was visualized by angiography and is normal in caliber.   The graft exhibits no disease.   The graft exhibits no disease.   The graft exhibits no disease.   The graft exhibits no disease.   LV end diastolic pressure is mildly elevated. Severe 3 vessel obstructive CAD Patent LIMA to the LAD Patent SVG to large second diagonal Patent SVG to OM Patent SVG to PL branch of the RCA Elevated LVEDP 25 mm Hg Plan: continue medical management.    Cardiac Studies   Cath 02/01/2022   Mid LM to Dist LM lesion is 25% stenosed.   Ost LAD to Prox LAD lesion is 50% stenosed.   Ost Cx to Prox Cx lesion is 90% stenosed.   Dist RCA-1 lesion is 90% stenosed.   Mid RCA lesion is 75% stenosed.   1st Diag lesion is 80% stenosed.   2nd Diag lesion is 75% stenosed.   1st Mrg lesion is 80% stenosed.   2nd Mrg lesion is 80% stenosed.   Mid LAD lesion is 100% stenosed.   RPAV lesion is 80% stenosed.   Dist RCA-2 lesion is 100% stenosed.   SVG graft was visualized by angiography and is normal in caliber.   SVG graft was visualized by angiography and is normal in caliber.   LIMA graft was  visualized by angiography and is large.   SVG graft was visualized by angiography and is normal in caliber.   The graft exhibits no disease.   The graft exhibits no disease.   The graft  exhibits no disease.   The graft exhibits no disease.   LV end diastolic pressure is mildly elevated.   Severe 3 vessel obstructive CAD Patent LIMA to the LAD Patent SVG to large second diagonal Patent SVG to OM Patent SVG to PL branch of the RCA Elevated LVEDP 25 mm Hg   Plan: continue medical management.  Patient Profile     66 y.o. 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   Assessment & Plan    Chest pain             - cath 02/01/2022 showed patent LIMA-LAD, SVG-D2, SVG-OM, and SVG-PLA, severe 3v native CAD. Medical management was recommendated.   - no further cardiac work up planned.               CAD s/p CABG 2020: on coreg, hydralazine/imdur   SVT: single episode of SVT with HR 160s during dialysis on 8/21 at 10:41 AM, lasted 1 min before self resolving. Continue coreg.   Chronic combined CHF: euvolemic on exam.    HTN: coreg, hydralazine/imdur   HLD: on repatha at home   DM II   ESRD on HD: followed by Dr. Moshe Cipro as outpatient. Has HD MWF. Just started on HD a month ago. Still making urine about 4-5 times ago, aware of potential effect of contrast on remaining kidney function   H/o esophageal cancer: followed by [facility], diagnosed via biopsy in 02/2021   H/o CVA      For questions or updates, please contact [facility] Please consult [email or web address] for contact info under        Signed, Almyra Deforest, [place]  02/02/2022, 7:59 AM

## 2022-02-02 NOTE — TOC Progression Note (Signed)
Transition of Care [facility]) - Progression Note    Patient Details  Name: RONIEL HALLORAN MRN: [id number] Date of Birth: 1956-02-11  Transition of Care [facility]) CM/SW [place], [place] Phone Number: 02/02/2022, 4:36 PM  Clinical Narrative:     PT recommended SNF for patient when medically ready for dc. Patient agreeable to return back to [facility] for short term rehab. CSW called [facility] and LVM for [place]. CSW awaiting callback to confirm SNF bed for patient. Facility starts [place]. Patient not managed by [employer]. CSW called facility x3 unable to reach admissions. CSW informed Evelena Peat with [facility] leadership. CSW will continue to follow and assist with patients dc planning needs.  Expected Discharge Plan: Skilled Nursing Facility Barriers to Discharge: Continued Medical Work up  Expected Discharge Plan and Services Expected Discharge Plan: [place] In-house Referral: Clinical Social Work     Living arrangements for the past 2 months: [place]                                       Social Determinants of Health ([name]) Interventions    Readmission Risk Interventions    08/30/2021    2:40 PM 07/26/2021    8:24 AM  Readmission Risk Prevention Plan  Transportation Screening Complete Complete  HRI or Home Care Consult  Complete  Social Work Consult for [place] Planning/Counseling  Complete  Palliative Care Screening  Not Applicable  Medication Review Press photographer) Complete Complete  PCP or Specialist appointment within 3-5 days of discharge Not Complete   HRI or [place] Complete   SW Recovery Care/Counseling Consult Complete   [place] Not Applicable

## 2022-02-02 NOTE — Progress Notes (Signed)
Initial Nutrition Assessment  DOCUMENTATION CODES:   Non-severe (moderate) malnutrition in context of chronic illness  INTERVENTION:   Boost Plus PO BID, each supplement provides 360 kcal and 14 gm protein.  Renal MVI daily.  NUTRITION DIAGNOSIS:   Moderate Malnutrition related to chronic illness (ESRD on HD) as evidenced by mild muscle depletion, moderate muscle depletion, mild fat depletion, moderate fat depletion.  GOAL:   Patient will meet greater than or equal to 90% of their needs  MONITOR:   PO intake, Supplement acceptance, Labs  REASON FOR ASSESSMENT:   Malnutrition Screening Tool    ASSESSMENT:   66 yo male admitted with chest pain. PMH includes HTN, DM-2, CAD, CHF, arthritis, CHF, stroke, esophageal cancer, ESRD on HD.  8/22 - S/P cardiac cath  Patient is currently on a renal carb modified diet. He reports good intake of breakfast and lunch today. He has breakfast after HD this morning, then ate lunch an hour later. He is hungry, waiting on dinner at 5:30. He drinks Boost Plus chocolate supplements when he can get them. He lost a lot of weight last year, but thinks he has gained it all back now. He is unsure of his EDW.  Weight history reviewed. Patient with 14% weight decrease in the past 4.5 months. Weight could be fluctuating with fluid status, given hx of CHF and ESRD on HD.   Labs reviewed.  CBG: [phone]  Medications reviewed and include calcitriol, Novolog, Protonix.  Patient meets criteria for moderate malnutrition, given mild-moderate depletion of muscle and subcutaneous fat mass.   NUTRITION - FOCUSED PHYSICAL EXAM:  Flowsheet Row Most Recent Value  Orbital Region Mild depletion  Upper Arm Region Moderate depletion  Thoracic and Lumbar Region Moderate depletion  Buccal Region Mild depletion  Temple Region Moderate depletion  Clavicle Bone Region Moderate depletion  Clavicle and Acromion Bone Region Mild depletion  Scapular Bone Region  Mild depletion  Dorsal Hand Mild depletion  Patellar Region Mild depletion  Anterior Thigh Region Mild depletion  Posterior Calf Region Mild depletion  Edema (RD Assessment) Mild  Hair Reviewed  Eyes Reviewed  Mouth Reviewed  Skin Reviewed  Nails Reviewed       Diet Order:   Diet Order             Diet renal/carb modified with fluid restriction Diet-HS Snack? Nothing; Fluid restriction: 1200 mL Fluid; Room service appropriate? Yes; Fluid consistency: Thin  Diet effective now                   EDUCATION NEEDS:   No education needs have been identified at this time  Skin:  Skin Assessment: Reviewed RN Assessment  Last BM:  8/21  Height:   Ht Readings from Last 1 Encounters:  01/30/22 '5\' 10"'$  (1.778 m)    Weight:   Wt Readings from Last 1 Encounters:  02/02/22 79.6 kg    BMI:  Body mass index is 25.18 kg/m.  Estimated Nutritional Needs:   Kcal:  2400-2600  Protein:  100-120 gm  Fluid:  1 L + UOP    Lucas Mallow RD, LDN, CNSC Please refer to Amion for contact information.
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
## 2022-02-02 NOTE — Progress Notes (Signed)
[facility] Progress Note   Subjective:   Seen on HD. Tolerating well, denies SOB, CP, dizziness, abdominal pain and nausea.   Objective Vitals:   02/02/22 0720 02/02/22 0730 02/02/22 0800 02/02/22 0830  BP: (!) 153/72 (!) 151/76 (!) 150/68 (!) 150/75  Pulse: 68 74 76 70  Resp: 16 (!) 21 (!) 27 (!) 25  Temp:      TempSrc:      SpO2: 97% 99% 97% 98%  Weight:      Height:       Physical Exam General: Alert male in NAD Heart: RRR, no murmurs, rubs or gallops Lungs: CTA bilaterally without wheezing, rhonchi or rales Abdomen: Soft, non-tender, non-distended, +BS Extremities: No edema b/l lower extremities Dialysis Access:  TDC, maturing LUE AVF   Additional Objective Labs: Basic Metabolic Panel: Recent Labs  Lab 01/31/22 0758 01/31/22 1330 02/01/22 0353 02/02/22 0417  NA 135 135 132* 136  K 4.3 3.5 3.8 3.9  CL 103 98 102 104  CO2 20* '26 22 22  '$ GLUCOSE 215* 235* 190* 218*  BUN 51* 25* 36* 45*  CREATININE 4.72* 2.88* 3.65* 4.70*  CALCIUM 8.8* 8.5* 8.1* 8.7*  PHOS 4.2  4.2 2.5  --   --    Liver Function Tests: Recent Labs  Lab 01/29/22 2150 01/30/22 0522 01/31/22 0758 01/31/22 1330  AST 25 17  --   --   ALT 23 22  --   --   ALKPHOS 163* 149*  --   --   BILITOT 0.4 0.4  --   --   PROT 6.7 6.3*  --   --   ALBUMIN 3.4* 3.3* 3.3* 3.3*   No results for input(s): "LIPASE", "AMYLASE" in the last 168 hours. CBC: Recent Labs  Lab 01/29/22 2150 01/30/22 0522 01/31/22 0758 01/31/22 1336 02/02/22 0735  WBC 9.7 8.2 6.3 5.3 6.3  NEUTROABS 7.7  --   --   --   --   HGB 8.8* 9.7* 9.0* 9.1* 9.1*  HCT 28.4* 31.3* 28.7* 28.1* 29.5*  MCV 87.1 86.7 85.9 82.9 85.3  PLT 202 210 197 207 238   Blood Culture    Component Value Date/Time   SDES  08/29/2021 1809    URINE, CLEAN CATCH Performed at [facility], [address], [place], [place] [address]    [place]  08/29/2021 1809    NONE Performed at [facility], [address] [place], [place]  [address]    [place]  08/29/2021 1809    NO GROWTH Performed at [facility] [address], [place], [place] [address]    REPTSTATUS 08/31/2021 FINAL 08/29/2021 1809    Cardiac Enzymes: No results for input(s): "CKTOTAL", "CKMB", "CKMBINDEX", "TROPONINI" in the last 168 hours. CBG: Recent Labs  Lab 01/31/22 2238 02/01/22 0729 02/01/22 1128 02/01/22 1656 02/01/22 2149  GLUCAP 208* 155* 104* 214* 212*   Iron Studies: No results for input(s): "IRON", "TIBC", "TRANSFERRIN", "FERRITIN" in the last 72 hours. '@lablastinr3'$ @ Studies/Results: CARDIAC CATHETERIZATION  Result Date: 02/01/2022   Mid LM to Dist LM lesion is 25% stenosed.   Ost LAD to Prox LAD lesion is 50% stenosed.   Ost Cx to Prox Cx lesion is 90% stenosed.   Dist RCA-1 lesion is 90% stenosed.   Mid RCA lesion is 75% stenosed.   1st Diag lesion is 80% stenosed.   2nd Diag lesion is 75% stenosed.   1st Mrg lesion is 80% stenosed.   2nd Mrg lesion is 80% stenosed.  Mid LAD lesion is 100% stenosed.   RPAV lesion is 80% stenosed.   Dist RCA-2 lesion is 100% stenosed.   SVG graft was visualized by angiography and is normal in caliber.   SVG graft was visualized by angiography and is normal in caliber.   LIMA graft was visualized by angiography and is large.   SVG graft was visualized by angiography and is normal in caliber.   The graft exhibits no disease.   The graft exhibits no disease.   The graft exhibits no disease.   The graft exhibits no disease.   LV end diastolic pressure is mildly elevated. Severe 3 vessel obstructive CAD Patent LIMA to the LAD Patent SVG to large second diagonal Patent SVG to OM Patent SVG to PL branch of the RCA Elevated LVEDP 25 mm Hg Plan: continue medical management.   Medications:  sodium chloride     anticoagulant sodium citrate      calcitRIOL  0.25 mcg Oral Q M,W,F-HD   carvedilol  12.5 mg Oral BID   Chlorhexidine Gluconate Cloth  6 each Topical Q0600   Chlorhexidine Gluconate Cloth  6 each  Topical Q0600   doxazosin  4 mg Oral Daily   heparin  5,000 Units Subcutaneous Q8H   hydrALAZINE  100 mg Oral TID   insulin aspart  0-9 Units Subcutaneous TID WC   isosorbide mononitrate  30 mg Oral Daily   pantoprazole  40 mg Oral Daily   sertraline  150 mg Oral QHS   sodium chloride flush  3 mL Intravenous Q12H   sodium chloride flush  3 mL Intravenous Q12H   sodium chloride flush  3 mL Intravenous Q12H    Dialysis Orders: MWF [facility] 4h  300/600  80.5kg  3k/2.5Ca bath  Heparin none  RIJ TDC (maturing LUA AVF created on 12/21/21) - last hep B labs: none - mircera 30 ug q2 , last 8/18, due early Sept - iron sucrose '100mg'$  q hd IV thru 8/21 (finished) - calcitriol 0.25 ug tiw po - last HD 8/18, post wt 80.3    BNP 1906, CXR no edema, K 4.1 Creat 4.2 BUN 48  alb 3.3  trop 31, 32    WBC 8k  Hb 9.7   Assessment/Plan: Chest pain/ hx CAD sp CABG - cardiology following, plan for heart cath today ESRD - on HD MWF. Did have an episode on tachycardia on HD but resolved quickly with stopping UF. No issues so far today.  HTN/ vol - BP and weights elevated but improved today, getting close to his EDW. continue carvedilol, doxazosin, hydralazine and imdur.  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 SP OP IV fe load completed 8/21.  MBD ckd - CCa controlled, continue calcitriol. Phos 2.5, will hold calcium acetate for now and follow trend.  Depression -on SSRI GI - was taking carafate but not recommended long term for HD patients. Now on protonix.     [name] Paganini, PA-C 02/02/2022, 8:40 AM  [facility] Pager: [phone]

## 2022-02-02 NOTE — Evaluation (Signed)
Occupational Therapy Evaluation Patient Details Name: [name] MRN: [id number] DOB: [birth year] Today's Date: 02/02/2022   History of Present Illness Pt is a 66 y/o male admitted secondary to increased weakness and falls. Found to have CHF exacerbation and anemia. PMH includes HTN, DM, CKD, CHF, CVA, and esophageal cancer.   Clinical Impression   Patient admitted for the diagnosis above.  PTA he was at a local SNF undergoing short term rehab prior to returning home.  Patient presents with deficits listed below.  OT recommends a return to SNF for rehab in order to maximize his functional status.  The patient does not have the needed assist at home.  OT will defer to the next level.        Recommendations for follow up therapy are one component of a multi-disciplinary discharge planning process, led by the attending physician.  Recommendations may be updated based on patient status, additional functional criteria and insurance authorization.   Follow Up Recommendations  Skilled nursing-short term rehab (<3 hours/day)    Assistance Recommended at Discharge Frequent or constant Supervision/Assistance  Patient can return home with the following A little help with walking and/or transfers;A little help with bathing/dressing/bathroom;Assistance with cooking/housework;Assist for transportation;Help with stairs or ramp for entrance    Functional Status Assessment  Patient has had a recent decline in their functional status and demonstrates the ability to make significant improvements in function in a reasonable and predictable amount of time.  Equipment Recommendations  None recommended by OT    Recommendations for Other Services       Precautions / Restrictions Precautions Precautions: Fall Restrictions Weight Bearing Restrictions: No      Mobility Bed Mobility Overal bed mobility: Modified Independent                  Transfers Overall transfer level: Needs  assistance Equipment used: Rolling walker (2 wheels) Transfers: Sit to/from Stand, Bed to chair/wheelchair/BSC Sit to Stand: Supervision     Step pivot transfers: Supervision            Balance Overall balance assessment: Needs assistance Sitting-balance support: Feet supported Sitting balance-Leahy Scale: Good     Standing balance support: Reliant on assistive device for balance Standing balance-Leahy Scale: Fair                             ADL either performed or assessed with clinical judgement   ADL Overall ADL's : Needs assistance/impaired Eating/Feeding: Independent;Sitting   Grooming: Set up;Sitting   Upper Body Bathing: Set up;Sitting   Lower Body Bathing: Minimal assistance;Sit to/from stand   Upper Body Dressing : Set up;Sitting   Lower Body Dressing: Minimal assistance;Sit to/from stand   Toilet Transfer: Engineer, manufacturing (4 wheels);Ambulation   Toileting- Clothing Manipulation and Hygiene: Supervision/safety               Vision Baseline Vision/History: 1 Wears glasses Patient Visual Report: No change from baseline       Perception Perception Perception: Within Functional Limits   Praxis Praxis Praxis: Intact    Pertinent Vitals/Pain Pain Assessment Pain Assessment: No/denies pain     Hand Dominance Right   Extremity/Trunk Assessment Upper Extremity Assessment Upper Extremity Assessment: Overall WFL for tasks assessed   Lower Extremity Assessment Lower Extremity Assessment: Defer to PT evaluation   Cervical / Trunk Assessment Cervical / Trunk Assessment: Normal   Communication Communication Communication: No difficulties   Cognition Arousal/Alertness: Awake/alert Behavior  During Therapy: WFL for tasks assessed/performed Overall Cognitive Status: Within Functional Limits for tasks assessed                                       General Comments   VSS on RA    Exercises     Shoulder  Instructions      Home Living Family/patient expects to be discharged to:: Skilled nursing facility                                        Prior Functioning/Environment Prior Level of Function : Needs assist                        OT Problem List: Decreased strength;Decreased activity tolerance;Impaired balance (sitting and/or standing)      OT Treatment/Interventions:      OT Goals(Current goals can be found in the care plan section) Acute Rehab OT Goals Patient Stated Goal: Return to SNF for continued rehab OT Goal Formulation: With patient Time For Goal Achievement: 02/10/22 Potential to Achieve Goals: Good  OT Frequency:      Co-evaluation              AM-PAC OT "6 Clicks" Daily Activity     Outcome Measure Help from another person eating meals?: None Help from another person taking care of personal grooming?: None Help from another person toileting, which includes using toliet, bedpan, or urinal?: A Little Help from another person bathing (including washing, rinsing, drying)?: A Little Help from another person to put on and taking off regular upper body clothing?: None Help from another person to put on and taking off regular lower body clothing?: A Little 6 Click Score: 21   End of Session Equipment Utilized During Treatment: Rolling walker (2 wheels) Nurse Communication: Mobility status  Activity Tolerance: Patient tolerated treatment well Patient left: in bed;with call bell/phone within reach  OT Visit Diagnosis: Unsteadiness on feet (R26.81)                Time: 6144-3154 OT Time Calculation (min): 21 min Charges:  OT General Charges $OT Visit: 1 Visit OT Evaluation $OT Eval Moderate Complexity: 1 Mod  02/02/2022  RP, OTR/L  Acute Rehabilitation Services  Office:  [phone]   Metta Clines 02/02/2022, 2:17 PM

## 2022-02-02 NOTE — Evaluation (Signed)
Physical Therapy Evaluation Patient Details Name: [name] MRN: [id number] DOB: 1956/04/12 Today's Date: 02/02/2022  History of Present Illness  Pt is a 66 y/o male admitted secondary tochest pain. Pt is s/p heart cath on 8/22. PMH includes HTN, DM, CKD, CHF, CVA, and esophageal cancer.  Clinical Impression  Pt admitted secondary to problem above with deficits below. Pt requiring min guard A for mobility tasks using RW. Requiring standing rests X2-3 throughout. Pt currently at SNF and recommend return at d/c. Will continue to follow acutely.        Recommendations for follow up therapy are one component of a multi-disciplinary discharge planning process, led by the attending physician.  Recommendations may be updated based on patient status, additional functional criteria and insurance authorization.  Follow Up Recommendations Skilled nursing-short term rehab (<3 hours/day) Can patient physically be transported by private vehicle: Yes    Assistance Recommended at Discharge Intermittent Supervision/Assistance  Patient can return home with the following  A little help with walking and/or transfers;A little help with bathing/dressing/bathroom;Assistance with cooking/housework;Help with stairs or ramp for entrance;Assist for transportation    Equipment Recommendations None recommended by PT  Recommendations for Other Services       Functional Status Assessment Patient has had a recent decline in their functional status and demonstrates the ability to make significant improvements in function in a reasonable and predictable amount of time.     Precautions / Restrictions Precautions Precautions: Fall Restrictions Weight Bearing Restrictions: No      Mobility  Bed Mobility Overal bed mobility: Needs Assistance Bed Mobility: Supine to Sit     Supine to sit: Min guard     General bed mobility comments: Min guard for safety. Increased time to come to standing.     Transfers Overall transfer level: Needs assistance Equipment used: Rolling walker (2 wheels) Transfers: Sit to/from Stand Sit to Stand: Min guard           General transfer comment: Min guard for safety. Increased time to come to standing.    Ambulation/Gait Ambulation/Gait assistance: Min guard Gait Distance (Feet): 100 Feet Assistive device: Rolling walker (2 wheels) Gait Pattern/deviations: Step-through pattern, Decreased stride length, Narrow base of support Gait velocity: Decreased     General Gait Details: Functional weakness noted. Required standing rest X2-3. Min guard for safety.  Stairs            Wheelchair Mobility    Modified Rankin (Stroke Patients Only)       Balance Overall balance assessment: Needs assistance Sitting-balance support: No upper extremity supported, Feet supported Sitting balance-Leahy Scale: Good     Standing balance support: Bilateral upper extremity supported, No upper extremity supported Standing balance-Leahy Scale: Fair                               Pertinent Vitals/Pain Pain Assessment Pain Assessment: No/denies pain    Home Living Family/patient expects to be discharged to:: Skilled nursing facility                        Prior Function Prior Level of Function : Needs assist             Mobility Comments: Was working with therapies on ambulation with RW       Hand Dominance   Dominant Hand: Right    Extremity/Trunk Assessment   Upper Extremity Assessment Upper Extremity Assessment: Defer  to OT evaluation    Lower Extremity Assessment Lower Extremity Assessment: Generalized weakness    Cervical / Trunk Assessment Cervical / Trunk Assessment: Kyphotic  Communication   Communication: No difficulties  Cognition Arousal/Alertness: Awake/alert Behavior During Therapy: WFL for tasks assessed/performed Overall Cognitive Status: Within Functional Limits for tasks assessed                                           General Comments      Exercises     Assessment/Plan    PT Assessment Patient needs continued PT services  PT Problem List Decreased strength;Decreased activity tolerance;Decreased mobility;Decreased balance;Pain       PT Treatment Interventions DME instruction;Gait training;Therapeutic activities;Functional mobility training;Balance training;Therapeutic exercise;Patient/family education    PT Goals (Current goals can be found in the Care Plan section)  Acute Rehab PT Goals Patient Stated Goal: to go back to SNF PT Goal Formulation: With patient Time For Goal Achievement: 02/16/22 Potential to Achieve Goals: Good    Frequency Min 2X/week     Co-evaluation               AM-PAC PT "6 Clicks" Mobility  Outcome Measure Help needed turning from your back to your side while in a flat bed without using bedrails?: None Help needed moving from lying on your back to sitting on the side of a flat bed without using bedrails?: A Little Help needed moving to and from a bed to a chair (including a wheelchair)?: A Little Help needed standing up from a chair using your arms (e.g., wheelchair or bedside chair)?: A Little Help needed to walk in hospital room?: A Little Help needed climbing 3-5 steps with a railing? : A Lot 6 Click Score: 18    End of Session Equipment Utilized During Treatment: Gait belt Activity Tolerance: Patient tolerated treatment well Patient left: in bed;with call bell/phone within reach;with bed alarm set Nurse Communication: Mobility status PT Visit Diagnosis: Unsteadiness on feet (R26.81);Muscle weakness (generalized) (M62.81)    Time: 1610-9604 PT Time Calculation (min) (ACUTE ONLY): 12 min   Charges:   PT Evaluation $PT Eval Moderate Complexity: 1 Mod          Reuel Derby, PT, DPT  Acute Rehabilitation Services  Office: [phone]   Rudean Hitt 02/02/2022, 2:40 PM

## 2022-02-03 ENCOUNTER — Inpatient Hospital Stay (HOSPITAL_COMMUNITY)
Admit: 2022-02-03 | Discharge: 2022-02-03 | Disposition: A | Discharge status: Home / Self Care | Payer: Medicare HMO | Attending: Physician Assistant | Admitting: Physician Assistant

## 2022-02-03 ENCOUNTER — Encounter (HOSPITAL_COMMUNITY): Payer: Self-pay

## 2022-02-03 ENCOUNTER — Encounter (HOSPITAL_COMMUNITY): Payer: Medicare HMO

## 2022-02-03 ENCOUNTER — Other Ambulatory Visit: Payer: Self-pay | Admitting: Physician Assistant

## 2022-02-03 DIAGNOSIS — I471 Supraventricular tachycardia: Secondary | ICD-10-CM | POA: Diagnosis not present

## 2022-02-03 DIAGNOSIS — R072 Precordial pain: Secondary | ICD-10-CM | POA: Diagnosis not present

## 2022-02-03 DIAGNOSIS — Z992 Dependence on renal dialysis: Secondary | ICD-10-CM | POA: Diagnosis not present

## 2022-02-03 DIAGNOSIS — N186 End stage renal disease: Secondary | ICD-10-CM | POA: Diagnosis not present

## 2022-02-03 LAB — GLUCOSE, CAPILLARY
Glucose-Capillary: 342 mg/dL — ABNORMAL HIGH (ref 70–99)
Glucose-Capillary: 233 mg/dL — ABNORMAL HIGH (ref 70–99)
Glucose-Capillary: 133 mg/dL — ABNORMAL HIGH (ref 70–99)
Glucose-Capillary: 159 mg/dL — ABNORMAL HIGH (ref 70–99)

## 2022-02-03 LAB — LIPOPROTEIN A (LPA): Lipoprotein (a): 15.6 nmol/L (ref <75.0)

## 2022-02-03 LAB — BASIC METABOLIC PANEL
Anion gap: 8 (ref 5–15)
BUN: 32 mg/dL — ABNORMAL HIGH (ref 8–23)
CO2: 28 mmol/L (ref 22–32)
Calcium: 8.7 mg/dL — ABNORMAL LOW (ref 8.9–10.3)
Chloride: 100 mmol/L (ref 98–111)
Creatinine, Ser: 3.83 mg/dL — ABNORMAL HIGH (ref 0.61–1.24)
GFR, Estimated: 17 mL/min — ABNORMAL LOW (ref >60)
Glucose, Bld: 264 mg/dL — ABNORMAL HIGH (ref 70–99)
Potassium: 3.7 mmol/L (ref 3.5–5.1)
Sodium: 136 mmol/L (ref 135–145)

## 2022-02-03 LAB — MAGNESIUM: Magnesium: 1.9 mg/dL (ref 1.7–2.4)

## 2022-02-03 MED ORDER — ISOSORBIDE MONONITRATE ER 30 MG PO TB24: 30.0000 mg | ORAL_TABLET | Freq: Every day | ORAL | 0 refills | Status: DC

## 2022-02-03 MED ORDER — METOPROLOL TARTRATE 50 MG PO TABS: 50.0000 mg | ORAL_TABLET | Freq: Two times a day (BID) | ORAL | 1 refills | Status: DC

## 2022-02-03 NOTE — TOC Progression Note (Addendum)
Transition of Care [facility]) - Progression Note    Patient Details  Name: CRISTIN SZATKOWSKI MRN: [id number] Date of Birth: 08-10-1955  Transition of Care [facility]) CM/SW [place], [place] Phone Number: 02/03/2022, 8:30 AM  Clinical Narrative:      CSW spoke with Marita [place] at [facility]. Marita Kansas confirmed SNF bed for patinet and confirmed DON will start insurance authorization for patient. CSW awaiting insurance authorization determination. CSW will continue to follow and assist with patients dc planning needs. Expected Discharge Plan: Skilled Nursing Facility Barriers to Discharge: Continued Medical Work up  Expected Discharge Plan and Services Expected Discharge Plan: [place] In-house Referral: Clinical Social Work     Living arrangements for the past 2 months: [place]                                       Social Determinants of Health ([name]) Interventions    Readmission Risk Interventions    08/30/2021    2:40 PM 07/26/2021    8:24 AM  Readmission Risk Prevention Plan  Transportation Screening Complete Complete  HRI or Home Care Consult  Complete  Social Work Consult for [place] Planning/Counseling  Complete  Palliative Care Screening  Not Applicable  Medication Review Press photographer) Complete Complete  PCP or Specialist appointment within 3-5 days of discharge Not Complete   HRI or [place] Complete   SW Recovery Care/Counseling Consult Complete   [place] Not Applicable

## 2022-02-03 NOTE — Discharge Summary (Signed)
Physician Discharge Summary   Patient: [name] MRN: [id number] DOB: November 27, 1955  Admit date:     01/29/2022  Discharge date: 02/03/22  Discharge Physician: Elmarie Shiley   PCP: [name], MD   Recommendations at discharge:   Follow up with PCP for further adjustment of insulin regimen.  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 [facility] Course: 66 year old with past medical history significant for diabetes type 2, hyperlipidemia, history of CABG, CKD stage V now on hemodialysis Monday Wednesday and Friday status post AV fistula placement admitted for chest pain.  Troponin were flat PE.  EKG was nonischemic.  Given his symptoms he was transferred to [facility] for further cardiac evaluation.  He underwent left heart cath which showed patent graft. He developed V. tach during hemodialysis on 8/22.  No further episodes.   Plan to transfer to skilled for rehab.  PT OT consulted.    Assessment and Plan: 1-Chest pain CAD status post CABG -Given symptomatology and risk factors with previous history of CABG he has been transferred to [facility] for further cardiac evaluation.  LDL 32, A1c 7.7.   -Cardiology  was consulted, patient underwent left heart cath 8/22, which showed patent CABG.  -Medical management -Imdur increase to 30 mg daily.  Chest pain free. Stable for discharge.   ESRD on dialysis [facility]) MWF Nephrology managing He had V. tach during hemodialysis on  8/22 He underwent  hemodialysis today with no further events   Congestive heart failure with reduced EF, EF 40% Slightly elevated BNP chest x-ray showing cardiomegaly but no pulmonary edema.  -Continue Coreg, Imdur. -Volume managed with hemodialysis   Essential hypertension, benign - Continue hydralazine,  -Coreg change to metoprolol due to episode of VT   Diabetes mellitus type 2 - A1c 7.7.  ISS/Accuchecks.  Diabetic coordinator consulted -Follow up with PCP for further adjustment.   Depression - Continue Zoloft     GERD (gastroesophageal reflux disease) - Continue Protonix  -Carafate discontinue  Mixed hyperlipidemia - Resume Repatha at discharge   Moderate Protein caloric malnutrition.  Started supplement.         Nutrition Problem: Moderate Malnutrition Etiology: chronic illness (ESRD on HD)       Signs/Symptoms: mild muscle depletion, moderate muscle depletion, mild fat depletion, moderate fat depletion          Consultants: Cardiology Nephrology  Procedures performed: HD, Cath  Disposition: Skilled nursing facility Diet recommendation:  Discharge Diet Orders (From admission, onward)     Start     Ordered   02/03/22 0000  Diet - low sodium heart healthy        02/03/22 1040           Carb modified diet DISCHARGE MEDICATION: Allergies as of 02/03/2022       Reactions   Bee Venom Anaphylaxis   Atorvastatin Other (See Comments)   myalgia   Diltiazem Itching   Reglan [metoclopramide] Other (See Comments)   Suicidal  Rosuvastatin Other (See Comments)   myalgias   Valsartan Itching        Medication List     STOP taking these medications    carvedilol 12.5 MG tablet Commonly known as: COREG   cyanocobalamin 1000 MCG tablet Commonly known as: VITAMIN B12   FeroSul 325 (65 FE) MG tablet Generic drug: ferrous sulfate   folic acid 762 MCG tablet Commonly known as: FOLVITE   oxyCODONE-acetaminophen 5-325 MG tablet Commonly  known as: Percocet   Polyethylene Glycol 3350 Powd   sucralfate 1 GM/10ML suspension Commonly known as: CARAFATE   torsemide 20 MG tablet Commonly known as: DEMADEX       TAKE these medications    acetaminophen 325 MG tablet Commonly known as: TYLENOL Take 2 tablets (650 mg total) by mouth every 6 (six) hours as needed for mild pain, moderate pain or fever.   albuterol 108 (90 Base) MCG/ACT inhaler Commonly known as: VENTOLIN HFA Inhale 2 puffs into the lungs every 6 (six) hours as needed. What changed: reasons to take this   ARIPiprazole 5 MG tablet Commonly known as: ABILIFY Take 5 mg by mouth daily.   ascorbic acid 500 MG tablet Commonly known as: VITAMIN C Take 1 tablet (500 mg total) by mouth daily.   Aspercreme Lidocaine 4 % Generic drug: lidocaine Place 1 patch onto the skin daily. Wear for 12 hours then remove, wait 12 hours before applying a new patch to lower back   B-D ULTRAFINE III SHORT PEN 31G X 8 MM Misc Generic drug: Insulin Pen Needle 1 each by Does not apply route as directed.   calcium acetate 667 MG capsule Commonly known as: PHOSLO Take 667 mg by mouth in the morning, at noon, and at bedtime.   calcium-vitamin D 500-5 MG-MCG tablet Commonly known as: OSCAL WITH D Take 1 tablet by mouth 2 (two) times daily.   cholecalciferol 25 MCG (1000 UNIT) tablet Commonly known as: VITAMIN D3 Take 1,000 Units by mouth daily.   doxazosin 4 MG tablet Commonly known as: CARDURA Take 1 tablet (4 mg total) by mouth daily.   EPINEPHrine 0.3 mg/0.3 mL Soaj injection Commonly known as: EPI-PEN Inject 0.3 mg into the muscle as needed for anaphylaxis.   feeding supplement (NEPRO CARB STEADY) Liqd Take 237 mLs by mouth 2 (two) times daily with a meal. What changed: Another medication with the same name was removed. Continue taking this medication, and follow the directions you see here.   hydrALAZINE 100 MG tablet Commonly known as: APRESOLINE Take 1  tablet (100 mg total) by mouth 3 (three) times daily. What changed:  when to take this additional instructions   hydrOXYzine 25 MG capsule Commonly known as: VISTARIL TAKE ONE CAPSULE BY MOUTH NIGHTLY AS NEEDED FOR ALLERGIES AND INSOMNIA What changed:  how much to take how to take this when to take this reasons to take this additional instructions   insulin aspart 100 UNIT/ML injection Commonly known as: novoLOG Inject 0-15 Units into the skin 3 (three) times daily before meals. Per sliding scale   isosorbide mononitrate 30 MG 24 hr tablet Commonly known as: IMDUR Take 1 tablet (30 mg total) by mouth daily. Start taking on: February 04, 2022 What changed: how much to take   metoprolol tartrate 50 MG tablet Commonly known as: LOPRESSOR Take 1 tablet (50 mg total) by mouth 2 (two) times daily.   nitroGLYCERIN 0.4 MG SL tablet Commonly known as: NITROSTAT Place 1 tablet (0.4 mg total)  under the tongue every 5 (five) minutes as needed for chest pain.   omeprazole 40 MG capsule Commonly known as: PRILOSEC Take 40 mg by mouth in the morning and at bedtime.   ondansetron 4 MG tablet Commonly known as: ZOFRAN Take 4 mg by mouth every 8 (eight) hours as needed for nausea or vomiting.   Repatha SureClick 144 MG/ML Soaj Generic drug: Evolocumab Inject 140 mg as directed every 14 (fourteen) days. What changed: additional instructions   sertraline 100 MG tablet Commonly known as: ZOLOFT Take 1 and half tablet  by mouth daily What changed:  how much to take how to take this when to take this additional instructions   Theratrum Complete Tabs Take 1 tablet by mouth daily.   traMADol 50 MG tablet Commonly known as: ULTRAM Take 0.5 tablets (25 mg total) by mouth every 12 (twelve) hours as needed for moderate pain.        Discharge Exam: Filed Weights   01/31/22 1137 02/02/22 0710 02/02/22 1100  Weight: 81.3 kg 81.9 kg 79.6 kg   General; NAD  Condition at  discharge: stable  The results of significant diagnostics from this hospitalization (including imaging, microbiology, ancillary and laboratory) are listed below for reference.   Imaging Studies: CARDIAC CATHETERIZATION  Result Date: 02/01/2022   Mid LM to Dist LM lesion is 25% stenosed.   Ost LAD to Prox LAD lesion is 50% stenosed.   Ost Cx to Prox Cx lesion is 90% stenosed.   Dist RCA-1 lesion is 90% stenosed.   Mid RCA lesion is 75% stenosed.   1st Diag lesion is 80% stenosed.   2nd Diag lesion is 75% stenosed.   1st Mrg lesion is 80% stenosed.   2nd Mrg lesion is 80% stenosed.   Mid LAD lesion is 100% stenosed.   RPAV lesion is 80% stenosed.   Dist RCA-2 lesion is 100% stenosed.   SVG graft was visualized by angiography and is normal in caliber.   SVG graft was visualized by angiography and is normal in caliber.   LIMA graft was visualized by angiography and is large.   SVG graft was visualized by angiography and is normal in caliber.   The graft exhibits no disease.   The graft exhibits no disease.   The graft exhibits no disease.   The graft exhibits no disease.   LV end diastolic pressure is mildly elevated. Severe 3 vessel obstructive CAD Patent LIMA to the LAD Patent SVG to large second diagonal Patent SVG to OM Patent SVG to PL branch of the RCA Elevated LVEDP 25 mm Hg Plan: continue medical management.   DG Chest Port 1 View  Result Date: 01/29/2022 CLINICAL DATA:  Shortness of breath and dizziness. History of esophageal cancer. EXAM: PORTABLE CHEST 1 VIEW COMPARISON:  01/25/2022. FINDINGS: Heart is enlarged the mediastinal contour is stable. Sternotomy wires are present over the midline. A stable right internal jugular central venous catheter is noted. There is a small right pleural effusion with mild atelectasis or infiltrate at the right lung base. The left lung is clear. No pneumothorax. No acute osseous abnormality. IMPRESSION: 1. Small right pleural effusion with atelectasis or infiltrate.  2. Cardiomegaly. Electronically Signed   By: Brett Fairy M.D.   On: 01/29/2022 22:19    Microbiology: Results for orders placed or performed during the hospital encounter of 09/28/21  Resp Panel by RT-PCR (Flu A&B, Covid) Nasopharyngeal Swab     Status: None   Collection Time: 09/28/21  9:47 AM   Specimen: Nasopharyngeal Swab; Nasopharyngeal(NP) swabs in vial transport medium  Result Value Ref Range Status   SARS Coronavirus 2 by RT PCR NEGATIVE NEGATIVE Final    Comment: (NOTE) SARS-CoV-2 target nucleic acids are NOT DETECTED.  The SARS-CoV-2 RNA is generally detectable in upper respiratory specimens during the acute phase of infection. The lowest concentration of SARS-CoV-2 viral copies this assay can detect is 138 copies/mL. A negative result does not preclude SARS-Cov-2 infection and should not be used as the sole basis for treatment or other patient management decisions. A negative result may occur with  improper specimen collection/handling, submission of specimen other than nasopharyngeal swab, presence of viral mutation(s) within the areas targeted by this assay, and inadequate number of viral copies(<138 copies/mL). A negative result must be combined with clinical observations, patient history, and epidemiological information. The expected result is Negative.  Fact Sheet for Patients:  EntrepreneurPulse.com.au  Fact Sheet for Healthcare Providers:  IncredibleEmployment.be  This test is no t yet approved or cleared by the [place] FDA and  has been authorized for detection and/or diagnosis of SARS-CoV-2 by FDA under an Emergency Use Authorization (EUA). This EUA will remain  in effect (meaning this test can be used) for the duration of the COVID-19 declaration under Section 564(b)(1) of the Act, 21 U.S.C.section 360bbb-3(b)(1), unless the authorization is terminated  or revoked sooner.       Influenza A by PCR NEGATIVE  NEGATIVE Final   Influenza B by PCR NEGATIVE NEGATIVE Final    Comment: (NOTE) The Xpert Xpress SARS-CoV-2/FLU/RSV plus assay is intended as an aid in the diagnosis of influenza from Nasopharyngeal swab specimens and should not be used as a sole basis for treatment. Nasal washings and aspirates are unacceptable for Xpert Xpress SARS-CoV-2/FLU/RSV testing.  Fact Sheet for Patients: EntrepreneurPulse.com.au  Fact Sheet for Healthcare Providers: IncredibleEmployment.be  This test is not yet approved or cleared by the [place] FDA and has been authorized for detection and/or diagnosis of SARS-CoV-2 by FDA under an Emergency Use Authorization (EUA). This EUA will remain in effect (meaning this test can be used) for the duration of the COVID-19 declaration under Section 564(b)(1) of the Act, 21 U.S.C. section 360bbb-3(b)(1), unless the authorization is terminated or revoked.  Performed at [facility], [address], [place], [place] [address]     Labs: CBC: Recent Labs  Lab 01/29/22 2150 01/30/22 0522 01/31/22 0758 01/31/22 1336 02/02/22 0735  WBC 9.7 8.2 6.3 5.3 6.3  NEUTROABS 7.7  --   --   --   --   HGB 8.8* 9.7* 9.0* 9.1* 9.1*  HCT 28.4* 31.3* 28.7* 28.1* 29.5*  MCV 87.1 86.7 85.9 82.9 85.3  PLT 202 210 197 207 093   Basic Metabolic Panel: Recent Labs  Lab 01/30/22 0522 01/31/22 0758 01/31/22 1330 02/01/22 0353 02/02/22 0417 02/03/22 0335  NA 136 135 135 132* 136 136  K 4.1 4.3 3.5 3.8 3.9 3.7  CL 104 103 98 102 104 100  CO2 23 20* '26 22 22 28  '$ GLUCOSE 245* 215* 235* 190* 218* 264*  BUN 48* 51* 25* 36* 45* 32*  CREATININE 4.19* 4.72* 2.88* 3.65* 4.70* 3.83*  CALCIUM 8.3* 8.8* 8.5* 8.1* 8.7* 8.7*  MG 2.0  --   --  1.7 1.9 1.9  PHOS  --  4.2  4.2 2.5  --   --   --    Liver Function Tests: Recent Labs  Lab 01/29/22 2150 01/30/22 0522 01/31/22  9470 01/31/22 1330  AST 25 17  --   --   ALT 23 22  --   --    ALKPHOS 163* 149*  --   --   BILITOT 0.4 0.4  --   --   PROT 6.7 6.3*  --   --   ALBUMIN 3.4* 3.3* 3.3* 3.3*   CBG: Recent Labs  Lab 02/01/22 2149 02/02/22 1129 02/02/22 1545 02/02/22 2119 02/03/22 0824  GLUCAP 212* 102* 325* 198* 342*    Discharge time spent: greater than 30 minutes.  Signed: Elmarie Shiley, MD [facility] 02/03/2022

## 2022-02-03 NOTE — Progress Notes (Signed)
Progress Note  Patient Name: [name] Date of Encounter: 02/03/2022  [facility] Cardiologist: Skeet Latch, MD   Subjective   No chest pain, only time he has had palps was at HD in [place], then had syncope, unaware of other episodes  Inpatient Medications    Scheduled Meds:  calcitRIOL  0.25 mcg Oral Q M,W,F-HD   Chlorhexidine Gluconate Cloth  6 each Topical Q0600   Chlorhexidine Gluconate Cloth  6 each Topical Q0600   doxazosin  4 mg Oral Daily   heparin  5,000 Units Subcutaneous Q8H   hydrALAZINE  100 mg Oral TID   insulin aspart  0-9 Units Subcutaneous TID WC   isosorbide mononitrate  30 mg Oral Daily   lactose free nutrition  237 mL Oral BID BM   metoprolol tartrate  50 mg Oral BID   multivitamin  1 tablet Oral QHS   pantoprazole  40 mg Oral Daily   sertraline  150 mg Oral QHS   sodium chloride flush  3 mL Intravenous Q12H   sodium chloride flush  3 mL Intravenous Q12H   sodium chloride flush  3 mL Intravenous Q12H   Continuous Infusions:  sodium chloride     PRN Meds: sodium chloride, acetaminophen **OR** acetaminophen, guaiFENesin-dextromethorphan, hydrALAZINE, hydrOXYzine, metoprolol tartrate, morphine injection, ondansetron **OR** ondansetron (ZOFRAN) IV, oxyCODONE-acetaminophen, polyethylene glycol, senna-docusate, sodium chloride flush, traMADol, traZODone   Vital Signs    Vitals:   02/02/22 1636 02/02/22 1919 02/02/22 2100 02/03/22 0345  BP: (!) 112/59 (!) 124/58 136/70 134/65  Pulse:  80 75 70  Resp:  '20 18 19  '$ Temp:  98.1 F (36.7 C) 98.6 F (37 C) 98.6 F (37 C)  TempSrc:  Oral Oral Oral  SpO2:  95% 95% 95%  Weight:      Height:        Intake/Output Summary (Last 24 hours) at 02/03/2022 0758 Last data filed at 02/02/2022 2130 Gross per 24 hour  Intake --  Output 2200 ml  Net -2200 ml      02/02/2022   11:00 AM 02/02/2022    7:10 AM 01/31/2022   11:37 AM  Last 3 Weights  Weight (lbs) 175 lb 7.8 oz 180 lb 8.9 oz 179 lb 3.7 oz   Weight (kg) 79.6 kg 81.9 kg 81.3 kg      Telemetry    SR w/ episode of SVT at 12:02 am - Personally Reviewed  ECG    None today - Personally Reviewed  Physical Exam   General: Well developed, well nourished, male in no acute distress Head: Eyes PERRLA, Head normocephalic and atraumatic Lungs: clear bilaterally to auscultation. Heart: HRRR S1 S2, without rub or gallop. soft murmur. 4/4 extremity pulses are 2+ & equal. No JVD. Abdomen: Bowel sounds are present, abdomen soft and non-tender without masses or  hernias noted. Msk: Normal strength and tone for age. Extremities: No clubbing, cyanosis or edema. R groin cath site w/out ecchymosis or hematoma   Skin:  No rashes or lesions noted. Neuro: Alert and oriented X 3. Psych:  Good affect, responds appropriately  Labs    High Sensitivity Troponin:   Recent Labs  Lab 01/29/22 2150 01/29/22 2344 01/30/22 0522  TROPONINIHS 30* 31* 32*     Chemistry Recent Labs  Lab 01/29/22 2150 01/29/22 2150 01/30/22 0522 01/31/22 0758 01/31/22 1330 02/01/22 0353 02/02/22 0417 02/03/22 0335  NA 138  --  136 135 135 132* 136 136  K 3.8  --  4.1 4.3  3.5 3.8 3.9 3.7  CL 105  --  104 103 98 102 104 100  CO2 24  --  23 20* '26 22 22 28  '$ GLUCOSE 137*  --  245* 215* 235* 190* 218* 264*  BUN 46*  --  48* 51* 25* 36* 45* 32*  CREATININE 4.11*  --  4.19* 4.72* 2.88* 3.65* 4.70* 3.83*  CALCIUM 8.5*  --  8.3* 8.8* 8.5* 8.1* 8.7* 8.7*  MG  --    < > 2.0  --   --  1.7 1.9 1.9  PROT 6.7  --  6.3*  --   --   --   --   --   ALBUMIN 3.4*  --  3.3* 3.3* 3.3*  --   --   --   AST 25  --  17  --   --   --   --   --   ALT 23  --  22  --   --   --   --   --   ALKPHOS 163*  --  149*  --   --   --   --   --   BILITOT 0.4  --  0.4  --   --   --   --   --   GFRNONAA 15*  --  15* 13* 23* 18* 13* 17*  ANIONGAP 9  --  '9 12 11 8 10 8   '$ < > = values in this interval not displayed.    Lipids  Recent Labs  Lab 01/31/22 1330  CHOL 92  TRIG 89  HDL 50   LDLCALC 24  CHOLHDL 1.8    Hematology Recent Labs  Lab 01/31/22 0758 01/31/22 1336 02/02/22 0735  WBC 6.3 5.3 6.3  RBC 3.34* 3.39* 3.46*  HGB 9.0* 9.1* 9.1*  HCT 28.7* 28.1* 29.5*  MCV 85.9 82.9 85.3  MCH 26.9 26.8 26.3  MCHC 31.4 32.4 30.8  RDW 16.4* 16.3* 16.5*  PLT 197 207 238   Thyroid No results for input(s): "TSH", "FREET4" in the last 168 hours.  BNP Recent Labs  Lab 01/29/22 2150  BNP 1,906.0*    DDimer No results for input(s): "DDIMER" in the last 168 hours.   Radiology    CARDIAC CATHETERIZATION  Result Date: 02/01/2022   Mid LM to Dist LM lesion is 25% stenosed.   Ost LAD to Prox LAD lesion is 50% stenosed.   Ost Cx to Prox Cx lesion is 90% stenosed.   Dist RCA-1 lesion is 90% stenosed.   Mid RCA lesion is 75% stenosed.   1st Diag lesion is 80% stenosed.   2nd Diag lesion is 75% stenosed.   1st Mrg lesion is 80% stenosed.   2nd Mrg lesion is 80% stenosed.   Mid LAD lesion is 100% stenosed.   RPAV lesion is 80% stenosed.   Dist RCA-2 lesion is 100% stenosed.   SVG graft was visualized by angiography and is normal in caliber.   SVG graft was visualized by angiography and is normal in caliber.   LIMA graft was visualized by angiography and is large.   SVG graft was visualized by angiography and is normal in caliber.   The graft exhibits no disease.   The graft exhibits no disease.   The graft exhibits no disease.   The graft exhibits no disease.   LV end diastolic pressure is mildly elevated. Severe 3 vessel obstructive CAD Patent LIMA to the LAD Patent SVG to large  second diagonal Patent SVG to OM Patent SVG to PL branch of the RCA Elevated LVEDP 25 mm Hg Plan: continue medical management.    Cardiac Studies   Cath 02/01/2022   Mid LM to Dist LM lesion is 25% stenosed.   Ost LAD to Prox LAD lesion is 50% stenosed.   Ost Cx to Prox Cx lesion is 90% stenosed.   Dist RCA-1 lesion is 90% stenosed.   Mid RCA lesion is 75% stenosed.   1st Diag lesion is 80% stenosed.    2nd Diag lesion is 75% stenosed.   1st Mrg lesion is 80% stenosed.   2nd Mrg lesion is 80% stenosed.   Mid LAD lesion is 100% stenosed.   RPAV lesion is 80% stenosed.   Dist RCA-2 lesion is 100% stenosed.   SVG graft was visualized by angiography and is normal in caliber.   SVG graft was visualized by angiography and is normal in caliber.   LIMA graft was visualized by angiography and is large.   SVG graft was visualized by angiography and is normal in caliber.   The graft exhibits no disease.   The graft exhibits no disease.   The graft exhibits no disease.   The graft exhibits no disease.   LV end diastolic pressure is mildly elevated.   Severe 3 vessel obstructive CAD Patent LIMA to the LAD Patent SVG to large second diagonal Patent SVG to OM Patent SVG to PL branch of the RCA Elevated LVEDP 25 mm Hg   Plan: continue medical management. Diagnostic Dominance: Right   Patient Profile     66 y.o. male with PMH of chronic combined CHF, esophageal cancer, CVA, CAD s/p CABG x 4 03/2019, HTN, HLD, DM II, PAD, ESRD on HD and OSA who presented 08/20 with chest pain.   Assessment & Plan    Chest pain             - cath 02/01/2022 showed patent LIMA-LAD, SVG-D2, SVG-OM, and SVG-PLA, severe 3v native CAD. Medical management was recommendated.   - no further cardiac work up planned.               CAD s/p CABG 2020: on Lopressor 50 mg bid, hydralazine/imdur   SVT: had heart pounding during HD in [place] >> syncope. Had another episode last pm, about midnight, no sx, thinks he was asleep. He was on Coreg 12.5 mg bid >> metoprolol 50 mg bid starting w/ last night pm dose. MD advise on pt wearing a monitor at d/c and/or EP eval.  Chronic combined CHF: euvolemic on exam. Vol mgt per HD   HTN: metop, hydralazine/imdur. SBP 160s yesterday, 130s this am.    HLD: on repatha at home   DM II -  on SSI, per IM   ESRD on HD: followed by Dr. Moshe Cipro as outpatient. Has HD MWF. Just started  on HD a month ago. 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

## 2022-02-03 NOTE — Progress Notes (Signed)
[facility] Progress Note   Subjective:   Seen in room, reports he is going home today. Denies SOB, CP, dizziness and nausea.   Objective Vitals:   02/02/22 1919 02/02/22 2100 02/03/22 0345 02/03/22 0900  BP: (!) 124/58 136/70 134/65 121/67  Pulse: 80 75 70 73  Resp: '20 18 19 18  '$ Temp: 98.1 F (36.7 C) 98.6 F (37 C) 98.6 F (37 C) 97.9 F (36.6 C)  TempSrc: Oral Oral Oral Oral  SpO2: 95% 95% 95%   Weight:      Height:       Physical Exam General: Alert male in NAD Heart: RRR, no murmurs, rubs or gallops Lungs: CTA bilaterally without wheezing, rhonchi or rales Abdomen: Soft, non-tender, non-distended, +BS Extremities: No edema b/l lower extremities Dialysis Access:  TDC, maturing LUE AVF + bruit  Additional Objective Labs: Basic Metabolic Panel: Recent Labs  Lab 01/31/22 0758 01/31/22 1330 02/01/22 0353 02/02/22 0417 02/03/22 0335  NA 135 135 132* 136 136  K 4.3 3.5 3.8 3.9 3.7  CL 103 98 102 104 100  CO2 20* '26 22 22 28  '$ GLUCOSE 215* 235* 190* 218* 264*  BUN 51* 25* 36* 45* 32*  CREATININE 4.72* 2.88* 3.65* 4.70* 3.83*  CALCIUM 8.8* 8.5* 8.1* 8.7* 8.7*  PHOS 4.2  4.2 2.5  --   --   --    Liver Function Tests: Recent Labs  Lab 01/29/22 2150 01/30/22 0522 01/31/22 0758 01/31/22 1330  AST 25 17  --   --   ALT 23 22  --   --   ALKPHOS 163* 149*  --   --   BILITOT 0.4 0.4  --   --   PROT 6.7 6.3*  --   --   ALBUMIN 3.4* 3.3* 3.3* 3.3*   No results for input(s): "LIPASE", "AMYLASE" in the last 168 hours. CBC: Recent Labs  Lab 01/29/22 2150 01/30/22 0522 01/31/22 0758 01/31/22 1336 02/02/22 0735  WBC 9.7 8.2 6.3 5.3 6.3  NEUTROABS 7.7  --   --   --   --   HGB 8.8* 9.7* 9.0* 9.1* 9.1*  HCT 28.4* 31.3* 28.7* 28.1* 29.5*  MCV 87.1 86.7 85.9 82.9 85.3  PLT 202 210 197 207 238   Blood Culture    Component Value Date/Time   SDES  08/29/2021 1809    URINE, CLEAN CATCH Performed at [facility], [address], [place],  [place] [address]    [place]  08/29/2021 1809    NONE Performed at [facility], [address] [place], [place] [address]    [place]  08/29/2021 1809    NO GROWTH Performed at [facility] [address], [place], [place] [address]    REPTSTATUS 08/31/2021 FINAL 08/29/2021 1809    Cardiac Enzymes: No results for input(s): "CKTOTAL", "CKMB", "CKMBINDEX", "TROPONINI" in the last 168 hours. CBG: Recent Labs  Lab 02/01/22 2149 02/02/22 1129 02/02/22 1545 02/02/22 2119 02/03/22 0824  GLUCAP 212* 102* 325* 198* 342*   Iron Studies: No results for input(s): "IRON", "TIBC", "TRANSFERRIN", "FERRITIN" in the last 72 hours. '@lablastinr3'$ @ Studies/Results: CARDIAC CATHETERIZATION  Result Date: 02/01/2022   Mid LM to Dist LM lesion is 25% stenosed.   Ost LAD to Prox LAD lesion is 50% stenosed.   Ost Cx to Prox Cx lesion is 90% stenosed.   Dist RCA-1 lesion is 90% stenosed.   Mid RCA lesion is 75% stenosed.   1st Diag lesion is 80% stenosed.  2nd Diag lesion is 75% stenosed.   1st Mrg lesion is 80% stenosed.   2nd Mrg lesion is 80% stenosed.   Mid LAD lesion is 100% stenosed.   RPAV lesion is 80% stenosed.   Dist RCA-2 lesion is 100% stenosed.   SVG graft was visualized by angiography and is normal in caliber.   SVG graft was visualized by angiography and is normal in caliber.   LIMA graft was visualized by angiography and is large.   SVG graft was visualized by angiography and is normal in caliber.   The graft exhibits no disease.   The graft exhibits no disease.   The graft exhibits no disease.   The graft exhibits no disease.   LV end diastolic pressure is mildly elevated. Severe 3 vessel obstructive CAD Patent LIMA to the LAD Patent SVG to large second diagonal Patent SVG to OM Patent SVG to PL branch of the RCA Elevated LVEDP 25 mm Hg Plan: continue medical management.   Medications:  sodium chloride      calcitRIOL  0.25 mcg Oral Q M,W,F-HD   Chlorhexidine Gluconate Cloth  6 each Topical  Q0600   Chlorhexidine Gluconate Cloth  6 each Topical Q0600   doxazosin  4 mg Oral Daily   heparin  5,000 Units Subcutaneous Q8H   hydrALAZINE  100 mg Oral TID   insulin aspart  0-9 Units Subcutaneous TID WC   isosorbide mononitrate  30 mg Oral Daily   lactose free nutrition  237 mL Oral BID BM   metoprolol tartrate  50 mg Oral BID   multivitamin  1 tablet Oral QHS   pantoprazole  40 mg Oral Daily   sertraline  150 mg Oral QHS   sodium chloride flush  3 mL Intravenous Q12H   sodium chloride flush  3 mL Intravenous Q12H   sodium chloride flush  3 mL Intravenous Q12H    Outpatient Dialysis Orders: MWF [facility] 4h  300/600  80.5kg  3k/2.5Ca bath  Heparin none  RIJ TDC (maturing LUA AVF created on 12/21/21) - last hep B labs: none - mircera 30 ug q2 , last 8/18, due early Sept - iron sucrose '100mg'$  q hd IV thru 8/21 (finished) - calcitriol 0.25 ug tiw po - last HD 8/18, post wt 80.3    BNP 1906, CXR no edema, K 4.1 Creat 4.2 BUN 48  alb 3.3  trop 31, 32    WBC 8k  Hb 9.7   Assessment/Plan: Chest pain/ hx CAD sp CABG - cardiology following, s/p heart cath with plan for medical management.  ESRD - on HD MWF. Did have an episode on tachycardia on HD but resolved quickly with stopping UF. No issues last HD HTN/ vol - BP and weights elevated but improved today, getting close to his EDW. continue carvedilol, doxazosin, hydralazine and imdur.  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 SP OP IV fe load completed 8/21.  MBD ckd - CCa controlled, continue calcitriol. Phos 2.5, will hold calcium acetate for now and follow trend.  Depression -on SSRI GI - was taking carafate but not recommended long term for HD patients. Now on protonix.     Anice Paganini, PA-C 02/03/2022, 11:25 AM  [facility] Pager: [phone]

## 2022-02-03 NOTE — Progress Notes (Signed)
D/C order noted. Contacted [facility] and spoke to [facility]. Clinic advised pt should d/c today if insurance auth for snf is received. Pt would resume at clinic tomorrow.   Melven Sartorius Renal Navigator [phone]

## 2022-02-03 NOTE — Care Management Important Message (Signed)
Important Message  Patient Details  Name: JAFET WISSING MRN: [id number] Date of Birth: 24-Nov-1955   Medicare Important Message Given:  Yes     Shelda Altes 02/03/2022, 10:52 AM

## 2022-02-04 DIAGNOSIS — I132 Hypertensive heart and chronic kidney disease with heart failure and with stage 5 chronic kidney disease, or end stage renal disease: Secondary | ICD-10-CM | POA: Diagnosis not present

## 2022-02-04 DIAGNOSIS — I25708 Atherosclerosis of coronary artery bypass graft(s), unspecified, with other forms of angina pectoris: Secondary | ICD-10-CM | POA: Diagnosis not present

## 2022-02-04 DIAGNOSIS — I471 Supraventricular tachycardia: Secondary | ICD-10-CM | POA: Diagnosis not present

## 2022-02-04 DIAGNOSIS — I200 Unstable angina: Secondary | ICD-10-CM

## 2022-02-04 DIAGNOSIS — I770 Arteriovenous fistula, acquired: Secondary | ICD-10-CM | POA: Diagnosis not present

## 2022-02-04 DIAGNOSIS — R5383 Other fatigue: Secondary | ICD-10-CM | POA: Diagnosis not present

## 2022-02-04 DIAGNOSIS — I5022 Chronic systolic (congestive) heart failure: Secondary | ICD-10-CM | POA: Diagnosis not present

## 2022-02-04 DIAGNOSIS — I5020 Unspecified systolic (congestive) heart failure: Secondary | ICD-10-CM | POA: Diagnosis not present

## 2022-02-04 DIAGNOSIS — R079 Chest pain, unspecified: Secondary | ICD-10-CM | POA: Diagnosis not present

## 2022-02-04 DIAGNOSIS — N186 End stage renal disease: Secondary | ICD-10-CM | POA: Diagnosis not present

## 2022-02-04 DIAGNOSIS — M6281 Muscle weakness (generalized): Secondary | ICD-10-CM | POA: Diagnosis not present

## 2022-02-04 DIAGNOSIS — E1122 Type 2 diabetes mellitus with diabetic chronic kidney disease: Secondary | ICD-10-CM | POA: Diagnosis not present

## 2022-02-04 DIAGNOSIS — N184 Chronic kidney disease, stage 4 (severe): Secondary | ICD-10-CM | POA: Diagnosis not present

## 2022-02-04 DIAGNOSIS — Z992 Dependence on renal dialysis: Secondary | ICD-10-CM | POA: Diagnosis not present

## 2022-02-04 DIAGNOSIS — N2581 Secondary hyperparathyroidism of renal origin: Secondary | ICD-10-CM | POA: Diagnosis not present

## 2022-02-04 DIAGNOSIS — Z7401 Bed confinement status: Secondary | ICD-10-CM | POA: Diagnosis not present

## 2022-02-04 DIAGNOSIS — R55 Syncope and collapse: Secondary | ICD-10-CM | POA: Diagnosis not present

## 2022-02-04 DIAGNOSIS — R531 Weakness: Secondary | ICD-10-CM | POA: Diagnosis not present

## 2022-02-04 DIAGNOSIS — I5021 Acute systolic (congestive) heart failure: Secondary | ICD-10-CM | POA: Diagnosis not present

## 2022-02-04 DIAGNOSIS — C159 Malignant neoplasm of esophagus, unspecified: Secondary | ICD-10-CM | POA: Diagnosis not present

## 2022-02-04 DIAGNOSIS — S2241XD Multiple fractures of ribs, right side, subsequent encounter for fracture with routine healing: Secondary | ICD-10-CM | POA: Diagnosis not present

## 2022-02-04 DIAGNOSIS — N250 Renal osteodystrophy: Secondary | ICD-10-CM | POA: Diagnosis not present

## 2022-02-04 DIAGNOSIS — F32A Depression, unspecified: Secondary | ICD-10-CM | POA: Diagnosis not present

## 2022-02-04 DIAGNOSIS — R2689 Other abnormalities of gait and mobility: Secondary | ICD-10-CM | POA: Diagnosis not present

## 2022-02-04 DIAGNOSIS — I10 Essential (primary) hypertension: Secondary | ICD-10-CM | POA: Diagnosis not present

## 2022-02-04 DIAGNOSIS — D631 Anemia in chronic kidney disease: Secondary | ICD-10-CM | POA: Diagnosis not present

## 2022-02-04 DIAGNOSIS — R0789 Other chest pain: Secondary | ICD-10-CM | POA: Diagnosis not present

## 2022-02-04 LAB — MAGNESIUM: Magnesium: 2.1 mg/dL (ref 1.7–2.4)

## 2022-02-04 LAB — BASIC METABOLIC PANEL
Anion gap: 11 (ref 5–15)
BUN: 42 mg/dL — ABNORMAL HIGH (ref 8–23)
CO2: 25 mmol/L (ref 22–32)
Calcium: 9 mg/dL (ref 8.9–10.3)
Chloride: 99 mmol/L (ref 98–111)
Creatinine, Ser: 4.9 mg/dL — ABNORMAL HIGH (ref 0.61–1.24)
GFR, Estimated: 12 mL/min — ABNORMAL LOW (ref >60)
Glucose, Bld: 204 mg/dL — ABNORMAL HIGH (ref 70–99)
Potassium: 4 mmol/L (ref 3.5–5.1)
Sodium: 135 mmol/L (ref 135–145)

## 2022-02-04 LAB — GLUCOSE, CAPILLARY: Glucose-Capillary: 112 mg/dL — ABNORMAL HIGH (ref 70–99)

## 2022-02-04 MED ORDER — HEPARIN SODIUM (PORCINE) 1000 UNIT/ML IJ SOLN
INTRAMUSCULAR | Status: AC
  Administered 2022-02-04: 1000 [IU]
  Filled 2022-02-04: qty 4

## 2022-02-04 MED ORDER — CHLORHEXIDINE GLUCONATE CLOTH 2 % EX PADS
6.0000 | MEDICATED_PAD | Freq: Every day | CUTANEOUS | Status: DC
  Administered 2022-02-04: 6 via TOPICAL

## 2022-02-04 NOTE — Discharge Summary (Signed)
Physician Discharge Summary   Patient: [name] MRN: [id number] DOB: [birth year]  Admit date:     01/29/2022  Discharge date: 02/04/22  Discharge Physician: Elmarie Shiley   PCP: [name], MD   Recommendations at discharge:   Follow up with PCP for further adjustment of insulin regimen.  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 [facility] Course: 66 year old with past medical history significant for diabetes type 2, hyperlipidemia, history of CABG, CKD stage V now on hemodialysis Monday Wednesday and Friday status post AV fistula placement admitted for chest pain.  Troponin were flat PE.  EKG was nonischemic.  Given his symptoms he was transferred to [facility] for further cardiac evaluation.  He underwent left heart cath which showed patent graft. He developed V. tach during hemodialysis on 8/22.  No further episodes.   Plan to transfer to skilled for rehab.  PT OT consulted.   Plan to discharge today after HD.    Assessment and Plan: 1-Chest pain CAD status post CABG -Given symptomatology and risk factors with previous history of CABG he has been transferred to [facility] for further cardiac evaluation.  LDL 32, A1c 7.7.   -Cardiology  was consulted, patient underwent left heart cath 8/22, which showed patent CABG.  -Medical management -Imdur increase to 30 mg daily.  Chest pain free. Stable for discharge.   ESRD on dialysis [facility]) MWF Nephrology managing He had  V. tach during hemodialysis on 8/22 He underwent  hemodialysis today with no further events   Congestive heart failure with reduced EF, EF 40% Slightly elevated BNP chest x-ray showing cardiomegaly but no pulmonary edema.  -Continue Coreg, Imdur. -Volume managed with hemodialysis   Essential hypertension, benign - Continue hydralazine,  -Coreg change to metoprolol due to episode of VT   Diabetes mellitus type 2 - A1c 7.7.  ISS/Accuchecks.  Diabetic coordinator consulted -Follow up with PCP for further adjustment.   Depression - Continue Zoloft     GERD (gastroesophageal reflux disease) - Continue Protonix  -Carafate discontinue  Mixed hyperlipidemia - Resume Repatha at discharge   Moderate Protein caloric malnutrition.  Started supplement.      Stable for discharge today.    Nutrition Problem: Moderate Malnutrition Etiology: chronic illness (ESRD on HD)       Signs/Symptoms: mild muscle depletion, moderate muscle depletion, mild fat depletion, moderate fat depletion          Consultants: Cardiology Nephrology  Procedures performed: HD, Cath  Disposition: Skilled nursing facility Diet recommendation:  Discharge Diet Orders (From admission, onward)     Start     Ordered   02/03/22 0000  Diet - low sodium heart healthy        02/03/22 1040           Carb modified diet DISCHARGE MEDICATION: Allergies as of 02/04/2022       Reactions   Bee Venom Anaphylaxis   Atorvastatin Other (See Comments)   myalgia   Diltiazem Itching  Reglan [metoclopramide] Other (See Comments)   Suicidal    Rosuvastatin Other (See Comments)   myalgias   Valsartan Itching        Medication List     STOP taking these medications    carvedilol 12.5 MG tablet Commonly known as: COREG   cyanocobalamin 1000 MCG tablet Commonly known as: VITAMIN B12   FeroSul 325 (65 FE) MG tablet Generic drug: ferrous sulfate   folic acid 361 MCG tablet Commonly known as:  FOLVITE   oxyCODONE-acetaminophen 5-325 MG tablet Commonly known as: Percocet   Polyethylene Glycol 3350 Powd   sucralfate 1 GM/10ML suspension Commonly known as: CARAFATE   torsemide 20 MG tablet Commonly known as: DEMADEX       TAKE these medications    acetaminophen 325 MG tablet Commonly known as: TYLENOL Take 2 tablets (650 mg total) by mouth every 6 (six) hours as needed for mild pain, moderate pain or fever.   albuterol 108 (90 Base) MCG/ACT inhaler Commonly known as: VENTOLIN HFA Inhale 2 puffs into the lungs every 6 (six) hours as needed. What changed: reasons to take this   ARIPiprazole 5 MG tablet Commonly known as: ABILIFY Take 5 mg by mouth daily.   ascorbic acid 500 MG tablet Commonly known as: VITAMIN C Take 1 tablet (500 mg total) by mouth daily.   Aspercreme Lidocaine 4 % Generic drug: lidocaine Place 1 patch onto the skin daily. Wear for 12 hours then remove, wait 12 hours before applying a new patch to lower back   B-D ULTRAFINE III SHORT PEN 31G X 8 MM Misc Generic drug: Insulin Pen Needle 1 each by Does not apply route as directed.   calcium acetate 667 MG capsule Commonly known as: PHOSLO Take 667 mg by mouth in the morning, at noon, and at bedtime.   calcium-vitamin D 500-5 MG-MCG tablet Commonly known as: OSCAL WITH D Take 1 tablet by mouth 2 (two) times daily.   cholecalciferol 25 MCG (1000 UNIT) tablet Commonly known as: VITAMIN D3 Take 1,000 Units by mouth daily.   doxazosin 4 MG tablet Commonly known as: CARDURA Take 1 tablet (4 mg total) by mouth daily.   EPINEPHrine 0.3 mg/0.3 mL Soaj injection Commonly known as: EPI-PEN Inject 0.3 mg into the muscle as needed for anaphylaxis.   feeding supplement (NEPRO CARB STEADY) Liqd Take 237 mLs by mouth 2 (two) times daily with a meal. What changed: Another medication with the same name was removed. Continue taking this medication, and follow the directions you see here.    hydrALAZINE 100 MG tablet Commonly known as: APRESOLINE Take 1 tablet (100 mg total) by mouth 3 (three) times daily. What changed:  when to take this additional instructions   hydrOXYzine 25 MG capsule Commonly known as: VISTARIL TAKE ONE CAPSULE BY MOUTH NIGHTLY AS NEEDED FOR ALLERGIES AND INSOMNIA What changed:  how much to take how to take this when to take this reasons to take this additional instructions   insulin aspart 100 UNIT/ML injection Commonly known as: novoLOG Inject 0-15 Units into the skin 3 (three) times daily before meals. Per sliding scale   isosorbide mononitrate 30 MG 24 hr tablet Commonly known as: IMDUR Take 1 tablet (30 mg total) by mouth daily. What changed: how much to take   metoprolol tartrate 50 MG tablet Commonly known as: LOPRESSOR Take 1 tablet (50 mg total) by mouth 2 (two) times daily.   nitroGLYCERIN 0.4 MG SL tablet Commonly known as: NITROSTAT Place  1 tablet (0.4 mg total) under the tongue every 5 (five) minutes as needed for chest pain.   omeprazole 40 MG capsule Commonly known as: PRILOSEC Take 40 mg by mouth in the morning and at bedtime.   ondansetron 4 MG tablet Commonly known as: ZOFRAN Take 4 mg by mouth every 8 (eight) hours as needed for nausea or vomiting.   Repatha SureClick 063 MG/ML Soaj Generic drug: Evolocumab Inject 140 mg as directed every 14 (fourteen) days. What changed: additional instructions   sertraline 100 MG tablet Commonly known as: ZOLOFT Take 1 and half tablet  by mouth daily What changed:  how much to take how to take this when to take this additional instructions   Theratrum Complete Tabs Take 1 tablet by mouth daily.   traMADol 50 MG tablet Commonly known as: ULTRAM Take 0.5 tablets (25 mg total) by mouth every 12 (twelve) hours as needed for moderate pain.        Contact information for after-discharge care     Destination     [facility] .   Service: Skilled Nursing Contact information: [address] [place] [phone]                    Discharge Exam: Danley Danker Weights   01/31/22 1137 02/02/22 0710 02/02/22 1100  Weight: 81.3 kg 81.9 kg 79.6 kg   General; NAD  Condition at discharge: stable  The results of significant diagnostics from this hospitalization (including imaging, microbiology, ancillary and laboratory) are listed below for reference.   Imaging Studies: CARDIAC CATHETERIZATION  Result Date: 02/01/2022   Mid LM to Dist LM lesion is 25% stenosed.   Ost LAD to Prox LAD lesion is 50% stenosed.   Ost Cx to Prox Cx lesion is 90% stenosed.   Dist RCA-1 lesion is 90% stenosed.   Mid RCA lesion is 75% stenosed.   1st Diag lesion is 80% stenosed.   2nd Diag lesion is 75% stenosed.   1st Mrg lesion is 80% stenosed.   2nd Mrg lesion is 80% stenosed.   Mid LAD lesion is 100% stenosed.   RPAV lesion is 80% stenosed.   Dist RCA-2 lesion is 100% stenosed.   SVG graft was visualized by angiography and is normal in caliber.   SVG graft was visualized by angiography and is normal in caliber.   LIMA graft was visualized by angiography and is large.   SVG graft was visualized by angiography and is normal in caliber.   The graft exhibits no disease.   The graft exhibits no disease.   The graft exhibits no disease.   The graft exhibits no disease.   LV end diastolic pressure is mildly elevated. Severe 3 vessel obstructive CAD Patent LIMA to the LAD Patent SVG to large second diagonal Patent SVG to OM Patent SVG to PL branch of the RCA Elevated LVEDP 25 mm Hg Plan: continue medical management.   DG Chest Port 1 View  Result Date: 01/29/2022 CLINICAL DATA:  Shortness of breath and dizziness. History of esophageal cancer. EXAM: PORTABLE CHEST 1 VIEW COMPARISON:  01/25/2022. FINDINGS: Heart is enlarged the mediastinal contour is stable. Sternotomy wires are present over the midline. A  stable right internal jugular central venous catheter is noted. There is a small right pleural effusion with mild atelectasis or infiltrate at the right lung base. The left lung is clear. No pneumothorax. No acute osseous abnormality.  IMPRESSION: 1. Small right pleural effusion with atelectasis or infiltrate. 2. Cardiomegaly. Electronically Signed   By: Brett Fairy M.D.   On: 01/29/2022 22:19    Microbiology: Results for orders placed or performed during the hospital encounter of 09/28/21  Resp Panel by RT-PCR (Flu A&B, Covid) Nasopharyngeal Swab     Status: None   Collection Time: 09/28/21  9:47 AM   Specimen: Nasopharyngeal Swab; Nasopharyngeal(NP) swabs in vial transport medium  Result Value Ref Range Status   SARS Coronavirus 2 by RT PCR NEGATIVE NEGATIVE Final    Comment: (NOTE) SARS-CoV-2 target nucleic acids are NOT DETECTED.  The SARS-CoV-2 RNA is generally detectable in upper respiratory specimens during the acute phase of infection. The lowest concentration of SARS-CoV-2 viral copies this assay can detect is 138 copies/mL. A negative result does not preclude SARS-Cov-2 infection and should not be used as the sole basis for treatment or other patient management decisions. A negative result may occur with  improper specimen collection/handling, submission of specimen other than nasopharyngeal swab, presence of viral mutation(s) within the areas targeted by this assay, and inadequate number of viral copies(<138 copies/mL). A negative result must be combined with clinical observations, patient history, and epidemiological information. The expected result is Negative.  Fact Sheet for Patients:  EntrepreneurPulse.com.au  Fact Sheet for Healthcare Providers:  IncredibleEmployment.be  This test is no t yet approved or cleared by the [place] FDA and  has been authorized for detection and/or diagnosis of SARS-CoV-2 by FDA under an Emergency  Use Authorization (EUA). This EUA will remain  in effect (meaning this test can be used) for the duration of the COVID-19 declaration under Section 564(b)(1) of the Act, 21 U.S.C.section 360bbb-3(b)(1), unless the authorization is terminated  or revoked sooner.       Influenza A by PCR NEGATIVE NEGATIVE Final   Influenza B by PCR NEGATIVE NEGATIVE Final    Comment: (NOTE) The Xpert Xpress SARS-CoV-2/FLU/RSV plus assay is intended as an aid in the diagnosis of influenza from Nasopharyngeal swab specimens and should not be used as a sole basis for treatment. Nasal washings and aspirates are unacceptable for Xpert Xpress SARS-CoV-2/FLU/RSV testing.  Fact Sheet for Patients: EntrepreneurPulse.com.au  Fact Sheet for Healthcare Providers: IncredibleEmployment.be  This test is not yet approved or cleared by the [place] FDA and has been authorized for detection and/or diagnosis of SARS-CoV-2 by FDA under an Emergency Use Authorization (EUA). This EUA will remain in effect (meaning this test can be used) for the duration of the COVID-19 declaration under Section 564(b)(1) of the Act, 21 U.S.C. section 360bbb-3(b)(1), unless the authorization is terminated or revoked.  Performed at [facility], [address] [place], [place] [address]     Labs: CBC: Recent Labs  Lab 01/29/22 2150 01/30/22 0522 01/31/22 0758 01/31/22 1336 02/02/22 0735  WBC 9.7 8.2 6.3 5.3 6.3  NEUTROABS 7.7  --   --   --   --   HGB 8.8* 9.7* 9.0* 9.1* 9.1*  HCT 28.4* 31.3* 28.7* 28.1* 29.5*  MCV 87.1 86.7 85.9 82.9 85.3  PLT 202 210 197 207 354   Basic Metabolic Panel: Recent Labs  Lab 01/30/22 0522 01/31/22 0758 01/31/22 1330 02/01/22 0353 02/02/22 0417 02/03/22 0335 02/04/22 0414  NA 136 135 135 132* 136 136 135  K 4.1 4.3 3.5 3.8 3.9 3.7 4.0  CL 104 103 98 102 104 100 99  CO2 23 20* '26 22 22 28 25  '$ GLUCOSE 245* 215* 235* 190*  218* 264* 204*  BUN 48*  51* 25* 36* 45* 32* 42*  CREATININE 4.19* 4.72* 2.88* 3.65* 4.70* 3.83* 4.90*  CALCIUM 8.3* 8.8* 8.5* 8.1* 8.7* 8.7* 9.0  MG 2.0  --   --  1.7 1.9 1.9 2.1  PHOS  --  4.2  4.2 2.5  --   --   --   --    Liver Function Tests: Recent Labs  Lab 01/29/22 2150 01/30/22 0522 01/31/22 0758 01/31/22 1330  AST 25 17  --   --   ALT 23 22  --   --   ALKPHOS 163* 149*  --   --   BILITOT 0.4 0.4  --   --   PROT 6.7 6.3*  --   --   ALBUMIN 3.4* 3.3* 3.3* 3.3*   CBG: Recent Labs  Lab 02/02/22 2119 02/03/22 0824 02/03/22 1131 02/03/22 1819 02/03/22 2117  GLUCAP 198* 342* 233* 133* 159*    Discharge time spent: greater than 30 minutes.  Signed: Elmarie Shiley, MD [facility] 02/04/2022

## 2022-02-04 NOTE — Progress Notes (Addendum)
Call placed to [employer] re auth status and informed no auth started yesterday by SNF and that they manage pt's [employer]. SW started [place] (ref #[id number]) this morning. Spoke to PT who agreed to see pt today for updated therapy notes. Hopeful for insurance determination and dc to SNF today. [name] at [facility] updated.   UPDATE 1145: auth details received per Alyse Low at [facility] and they are able to admit pt after HD today. MD updated.   Wandra Feinstein, MSW, LCSW [phone] (coverage)

## 2022-02-04 NOTE — Plan of Care (Signed)
  Problem: Education: Goal: Knowledge of General Education information will improve Description: Including pain rating scale, medication(s)/side effects and non-pharmacologic comfort measures Outcome: Progressing   Problem: Health Behavior/Discharge Planning: Goal: Ability to manage health-related needs will improve Outcome: Progressing   Problem: Clinical Measurements: Goal: Ability to maintain clinical measurements within normal limits will improve Outcome: Progressing Goal: Will remain free from infection Outcome: Progressing Goal: Diagnostic test results will improve Outcome: Progressing Goal: Respiratory complications will improve Outcome: Progressing Goal: Cardiovascular complication will be avoided Outcome: Progressing   Problem: Activity: Goal: Risk for activity intolerance will decrease Outcome: Progressing   Problem: Nutrition: Goal: Adequate nutrition will be maintained Outcome: Progressing   Problem: Coping: Goal: Level of anxiety will decrease Outcome: Progressing   Problem: Elimination: Goal: Will not experience complications related to bowel motility Outcome: Progressing Goal: Will not experience complications related to urinary retention Outcome: Progressing   Problem: Pain Managment: Goal: General experience of comfort will improve Outcome: Progressing   Problem: Safety: Goal: Ability to remain free from injury will improve Outcome: Progressing   Problem: Skin Integrity: Goal: Risk for impaired skin integrity will decrease Outcome: Progressing   Problem: Education: Goal: Understanding of CV disease, CV risk reduction, and recovery process will improve Outcome: Progressing Goal: Individualized Educational Video(s) Outcome: Progressing   Problem: Activity: Goal: Ability to return to baseline activity level will improve Outcome: Progressing   Problem: Cardiovascular: Goal: Ability to achieve and maintain adequate cardiovascular perfusion  will improve Outcome: Progressing Goal: Vascular access site(s) Level 0-1 will be maintained Outcome: Progressing   Problem: Health Behavior/Discharge Planning: Goal: Ability to safely manage health-related needs after discharge will improve Outcome: Progressing   Problem: Education: Goal: Ability to describe self-care measures that may prevent or decrease complications (Diabetes Survival Skills Education) will improve Outcome: Progressing Goal: Individualized Educational Video(s) Outcome: Progressing   Problem: Coping: Goal: Ability to adjust to condition or change in health will improve Outcome: Progressing   Problem: Fluid Volume: Goal: Ability to maintain a balanced intake and output will improve Outcome: Progressing   Problem: Health Behavior/Discharge Planning: Goal: Ability to identify and utilize available resources and services will improve Outcome: Progressing Goal: Ability to manage health-related needs will improve Outcome: Progressing   Problem: Metabolic: Goal: Ability to maintain appropriate glucose levels will improve Outcome: Progressing   Problem: Nutritional: Goal: Maintenance of adequate nutrition will improve Outcome: Progressing Goal: Progress toward achieving an optimal weight will improve Outcome: Progressing   Problem: Skin Integrity: Goal: Risk for impaired skin integrity will decrease Outcome: Progressing   Problem: Tissue Perfusion: Goal: Adequacy of tissue perfusion will improve Outcome: Progressing   

## 2022-02-04 NOTE — Progress Notes (Addendum)
Contacted [facility] this morning and spoke to [place]. Clinic advised that pt did not d/c yesterday as hoped and pt will not be at treatment today but will hopefully resume on Monday if insurance auth for snf received.   Melven Sartorius Renal Navigator [phone]  Addendum at 4:35 pm: Pt to d/c to snf today. Contacted [facility] to advise clinic of pt's d/c today and that pt will resume care on Monday.

## 2022-02-04 NOTE — TOC Transition Note (Signed)
Transition of Care [facility]) - CM/SW Discharge Note   Patient Details  Name: [name] MRN: [id number] Date of Birth: 1956-05-17  Transition of Care [facility]) CM/SW Contact:  Amador Cunas, [place] Phone Number: 02/04/2022, 1:54 PM   Clinical Narrative:   Pt for dc to [facility] today. Confirmed with [name] at facility they are prepared to admit pt to room 155-1. Pt aware of dc and reports agreeable. Transport requested for 6pm pick up as pt is currently in HD. RN provided with number for report. SW signing off at dc.   Wandra Feinstein, MSW, LCSW [phone] (coverage)      Final next level of care: Skilled Nursing Facility Barriers to Discharge: No Barriers Identified   Patient Goals and CMS Choice Patient states their goals for this hospitalization and ongoing recovery are:: SNF CMS Medicare.gov Compare Post Acute Care list provided to:: Patient Choice offered to / list presented to : Patient  Discharge Placement              Patient chooses bed at:  [facility]) Patient to be transferred to facility by: [facility]   Patient and family notified of of transfer: 02/04/22  Discharge Plan and Services In-house Referral: Clinical Social Work                                   Social Determinants of Health ([name]) Interventions     Readmission Risk Interventions    08/30/2021    2:40 PM 07/26/2021    8:24 AM  Readmission Risk Prevention Plan  Transportation Screening Complete Complete  HRI or Home Care Consult  Complete  Social Work Consult for [place] Planning/Counseling  Complete  Palliative Care Screening  Not Applicable  Medication Review Press photographer) Complete Complete  PCP or Specialist appointment within 3-5 days of discharge Not Complete   HRI or [place] Complete   SW Recovery Care/Counseling Consult Complete   [place] Not Applicable

## 2022-02-04 NOTE — Progress Notes (Signed)
Subjective: Sitting up on side of bed no complaints for discharge to nursing home today after dialysis  Objective Vital signs in last 24 hours: Vitals:   02/03/22 0900 02/03/22 2113 02/04/22 0655 02/04/22 1038  BP: 121/67 125/65 132/67 120/63  Pulse: 73   66  Resp: '18 16 16 17  '$ Temp: 97.9 F (36.6 C) 98.6 F (37 C) 98.6 F (37 C) 98.3 F (36.8 C)  TempSrc: Oral Oral Oral Oral  SpO2:    95%  Weight:      Height:       Weight change:   Physical Exam: General: Alert adult male NAD Heart: RRR no MRG Lungs: CTA bilaterally nonlabored breathing Abdomen: NABS soft, NTND no ascites Extremities: No pedal edema Dialysis Access: PermCath intact dressing, left upper arm AV fistula positive bruit maturing   Outpatient Dialysis Orders: MWF [facility] 4h  300/600  80.5kg  3k/2.5Ca bath  Heparin none  RIJ TDC (maturing LUA AVF created on 12/21/21) - last hep B labs: none - mircera 30 ug q2 , last 8/18, due early Sept - iron sucrose '100mg'$  q hd IV thru 8/21 (finished) - calcitriol 0.25 ug tiw po - last HD 8/18, post wt 80.3  Problem/Plan: Chest pain with history of CAD status post CABG= status post HRT cath = plan medical Rx per cardiology ESRD -HD MWF on schedule HTN/volume -BP and weights were elevated on admission improved after HD UF getting close to the EDW, on carvedilol, hydralazine, Imdur and doxazosin follow-up blood pressures may be able to taper off some with UF on HD Anemia -Hgb 9.1 for ESA dosing early September HD status post OP IV load of iron steroid given 8/18 30 mcg will increase to 60 mcg every 2 weeks Secondary hyperparathyroidism -corrected calcium controlled phosphorus 2.5 holding binder for now, follow-up trend in outpatient HD Depression-on SSRI GI/GERD-was on Carafate but not recommended long-term for HD patient's now on Protonix Deconditioning awaiting SNF placement appears today   Ernest Haber, PA-C [facility]  [phone] 02/04/2022,12:19 PM  LOS: 4 days   Labs: Basic Metabolic Panel: Recent Labs  Lab 01/31/22 0758 01/31/22 1330 02/01/22 0353 02/02/22 0417 02/03/22 0335 02/04/22 0414  NA 135 135   < > 136 136 135  K 4.3 3.5   < > 3.9 3.7 4.0  CL 103 98   < > 104 100 99  CO2 20* 26   < > '22 28 25  '$ GLUCOSE 215* 235*   < > 218* 264* 204*  BUN 51* 25*   < > 45* 32* 42*  CREATININE 4.72* 2.88*   < > 4.70* 3.83* 4.90*  CALCIUM 8.8* 8.5*   < > 8.7* 8.7* 9.0  PHOS 4.2  4.2 2.5  --   --   --   --    < > = values in this interval not displayed.   Liver Function Tests: Recent Labs  Lab 01/29/22 2150 01/30/22 0522 01/31/22 0758 01/31/22 1330  AST 25 17  --   --   ALT 23 22  --   --   ALKPHOS 163* 149*  --   --   BILITOT 0.4 0.4  --   --   PROT 6.7 6.3*  --   --   ALBUMIN 3.4* 3.3* 3.3* 3.3*   No results for input(s): "LIPASE", "AMYLASE" in the last 168 hours. No results for input(s): "AMMONIA" in the last 168 hours. CBC: Recent Labs  Lab 01/29/22 2150 01/30/22 0522 01/31/22  3244 01/31/22 1336 02/02/22 0735  WBC 9.7 8.2 6.3 5.3 6.3  NEUTROABS 7.7  --   --   --   --   HGB 8.8* 9.7* 9.0* 9.1* 9.1*  HCT 28.4* 31.3* 28.7* 28.1* 29.5*  MCV 87.1 86.7 85.9 82.9 85.3  PLT 202 210 197 207 238   Cardiac Enzymes: No results for input(s): "CKTOTAL", "CKMB", "CKMBINDEX", "TROPONINI" in the last 168 hours. CBG: Recent Labs  Lab 02/02/22 2119 02/03/22 0824 02/03/22 1131 02/03/22 1819 02/03/22 2117  GLUCAP 198* 342* 233* 133* 159*    Studies/Results: No results found. Medications:  sodium chloride      calcitRIOL  0.25 mcg Oral Q M,W,F-HD   Chlorhexidine Gluconate Cloth  6 each Topical Q0600   Chlorhexidine Gluconate Cloth  6 each Topical Q0600   Chlorhexidine Gluconate Cloth  6 each Topical Q0600   doxazosin  4 mg Oral Daily   heparin  5,000 Units Subcutaneous Q8H   hydrALAZINE  100 mg Oral TID   insulin aspart  0-9 Units Subcutaneous TID WC   isosorbide mononitrate  30  mg Oral Daily   lactose free nutrition  237 mL Oral BID BM   metoprolol tartrate  50 mg Oral BID   multivitamin  1 tablet Oral QHS   pantoprazole  40 mg Oral Daily   sertraline  150 mg Oral QHS   sodium chloride flush  3 mL Intravenous Q12H   sodium chloride flush  3 mL Intravenous Q12H   sodium chloride flush  3 mL Intravenous Q12H

## 2022-02-04 NOTE — Progress Notes (Signed)
Received patient in bed to unit.  Alert and oriented.  Informed consent signed and in chart.   Treatment initiated: 1240 Treatment completed: 1543  Patient tolerated well.  Transported back to the room  Alert, without acute distress.  Hand-off given to patient's nurse.   Access used: catheter Access issues: none  Total UF removed: 2000 Medication(s) given: calcitriol Post HD VS: 98.1, 134/67(87), HR-63, RR-22, BO47-84 Post HD weight: 81.2kg   [facility]

## 2022-02-06 DIAGNOSIS — R531 Weakness: Secondary | ICD-10-CM | POA: Diagnosis not present

## 2022-02-06 DIAGNOSIS — I5021 Acute systolic (congestive) heart failure: Secondary | ICD-10-CM | POA: Diagnosis not present

## 2022-02-06 DIAGNOSIS — N186 End stage renal disease: Secondary | ICD-10-CM | POA: Diagnosis not present

## 2022-02-06 DIAGNOSIS — I10 Essential (primary) hypertension: Secondary | ICD-10-CM | POA: Diagnosis not present

## 2022-02-07 DIAGNOSIS — N2581 Secondary hyperparathyroidism of renal origin: Secondary | ICD-10-CM | POA: Diagnosis not present

## 2022-02-07 DIAGNOSIS — N186 End stage renal disease: Secondary | ICD-10-CM | POA: Diagnosis not present

## 2022-02-07 DIAGNOSIS — Z992 Dependence on renal dialysis: Secondary | ICD-10-CM | POA: Diagnosis not present

## 2022-02-08 ENCOUNTER — Telehealth: Payer: Self-pay | Admitting: *Deleted

## 2022-02-08 ENCOUNTER — Other Ambulatory Visit (HOSPITAL_COMMUNITY): Payer: Self-pay | Admitting: Internal Medicine

## 2022-02-08 DIAGNOSIS — C159 Malignant neoplasm of esophagus, unspecified: Secondary | ICD-10-CM

## 2022-02-08 NOTE — Telephone Encounter (Signed)
Sorry he is having sleep issues this is very common among individuals with multiple health issues.  These medications-trazodone can sometimes interact with medications such as sertraline which he is currently on.  If patient is open we can try low-dose trazodone if that does not help then we would need to do sitdown discussion before doing additional attempts other medicines.  Would also make sure that patient has a follow-up appointment with myself somewhere in the next month

## 2022-02-08 NOTE — Telephone Encounter (Signed)
Patient left message on machine that he would like a script written for Trazodone to help him sleep- not currently on med list - patient would like script sent to [place] and stated is is unavailable by phone  Last seen 09/2021

## 2022-02-09 DIAGNOSIS — N2581 Secondary hyperparathyroidism of renal origin: Secondary | ICD-10-CM | POA: Diagnosis not present

## 2022-02-09 DIAGNOSIS — N186 End stage renal disease: Secondary | ICD-10-CM | POA: Diagnosis not present

## 2022-02-09 DIAGNOSIS — Z992 Dependence on renal dialysis: Secondary | ICD-10-CM | POA: Diagnosis not present

## 2022-02-09 NOTE — Telephone Encounter (Signed)
Patient has been informed per drs notes regarding trazodone , pt states he was given this medication in the hospital and it helped him sleep he would like to go ahead and have a prescription sent to the pharmacy.

## 2022-02-09 NOTE — Telephone Encounter (Signed)
Left message for patient to return the call for additional details and recommendations.   

## 2022-02-10 ENCOUNTER — Encounter (HOSPITAL_COMMUNITY)
Admission: RE | Admit: 2022-02-10 | Discharge: 2022-02-10 | Disposition: A | Discharge status: Home / Self Care | Payer: Medicare HMO | Source: Ambulatory Visit | Attending: Internal Medicine | Admitting: Internal Medicine

## 2022-02-10 DIAGNOSIS — S2241XD Multiple fractures of ribs, right side, subsequent encounter for fracture with routine healing: Secondary | ICD-10-CM | POA: Diagnosis not present

## 2022-02-10 DIAGNOSIS — E1122 Type 2 diabetes mellitus with diabetic chronic kidney disease: Secondary | ICD-10-CM | POA: Diagnosis not present

## 2022-02-10 DIAGNOSIS — C159 Malignant neoplasm of esophagus, unspecified: Secondary | ICD-10-CM | POA: Insufficient documentation

## 2022-02-10 DIAGNOSIS — Z992 Dependence on renal dialysis: Secondary | ICD-10-CM | POA: Diagnosis not present

## 2022-02-10 DIAGNOSIS — N186 End stage renal disease: Secondary | ICD-10-CM | POA: Diagnosis not present

## 2022-02-10 MED ORDER — FLUDEOXYGLUCOSE F - 18 (FDG) INJECTION
9.9100 | Freq: Once | INTRAVENOUS | Status: AC | PRN
  Administered 2022-02-10: 9.91 via INTRAVENOUS

## 2022-02-11 ENCOUNTER — Telehealth: Payer: Self-pay | Admitting: Family Medicine

## 2022-02-11 ENCOUNTER — Other Ambulatory Visit: Payer: Self-pay | Admitting: Family Medicine

## 2022-02-11 DIAGNOSIS — I5022 Chronic systolic (congestive) heart failure: Secondary | ICD-10-CM | POA: Diagnosis not present

## 2022-02-11 DIAGNOSIS — I25708 Atherosclerosis of coronary artery bypass graft(s), unspecified, with other forms of angina pectoris: Secondary | ICD-10-CM | POA: Diagnosis not present

## 2022-02-11 DIAGNOSIS — R55 Syncope and collapse: Secondary | ICD-10-CM | POA: Diagnosis not present

## 2022-02-11 DIAGNOSIS — R079 Chest pain, unspecified: Secondary | ICD-10-CM | POA: Diagnosis not present

## 2022-02-11 DIAGNOSIS — N184 Chronic kidney disease, stage 4 (severe): Secondary | ICD-10-CM | POA: Diagnosis not present

## 2022-02-11 DIAGNOSIS — I770 Arteriovenous fistula, acquired: Secondary | ICD-10-CM | POA: Diagnosis not present

## 2022-02-11 DIAGNOSIS — R2689 Other abnormalities of gait and mobility: Secondary | ICD-10-CM | POA: Diagnosis not present

## 2022-02-11 DIAGNOSIS — N186 End stage renal disease: Secondary | ICD-10-CM | POA: Diagnosis not present

## 2022-02-11 DIAGNOSIS — Z992 Dependence on renal dialysis: Secondary | ICD-10-CM | POA: Diagnosis not present

## 2022-02-11 DIAGNOSIS — N2581 Secondary hyperparathyroidism of renal origin: Secondary | ICD-10-CM | POA: Diagnosis not present

## 2022-02-11 DIAGNOSIS — I471 Supraventricular tachycardia: Secondary | ICD-10-CM | POA: Diagnosis not present

## 2022-02-11 DIAGNOSIS — M6281 Muscle weakness (generalized): Secondary | ICD-10-CM | POA: Diagnosis not present

## 2022-02-11 MED ORDER — TRAZODONE HCL 50 MG PO TABS: 25.0000 mg | ORAL_TABLET | Freq: Every evening | ORAL | 3 refills | Status: DC | PRN

## 2022-02-11 MED ORDER — SERTRALINE HCL 100 MG PO TABS: ORAL_TABLET | ORAL | 5 refills | Status: DC

## 2022-02-11 NOTE — Telephone Encounter (Signed)
[facility] contacted and verbalized understanding. Pt partner also contacted and verbalized understanding. An order will need to be faxed over to [facility].

## 2022-02-11 NOTE — Telephone Encounter (Signed)
Nurses Please call patient Trazodone was sent into his pharmacy [name] He would have to reduce sertraline to 1 tablet daily of the 100 mg Also do not take tramadol with this combination Recommend follow-up office visit for this fall

## 2022-02-11 NOTE — Telephone Encounter (Signed)
We will need to reduce his sertraline from 100 mg no more per day. Then may have trazodone 50 mg tablet, directions 1/2 tablet to 1 full tablet nightly as needed sleep, #30, 2 refills Patient does need to do a follow-up visit with [place] this fall I will send in the trazodone please encourage him and instruct him to reduce sertraline to 100 mg/day

## 2022-02-11 NOTE — Telephone Encounter (Signed)
Pt has additional message also; this message placed within previous message.

## 2022-02-14 DIAGNOSIS — N2581 Secondary hyperparathyroidism of renal origin: Secondary | ICD-10-CM | POA: Diagnosis not present

## 2022-02-14 DIAGNOSIS — Z992 Dependence on renal dialysis: Secondary | ICD-10-CM | POA: Diagnosis not present

## 2022-02-14 DIAGNOSIS — N186 End stage renal disease: Secondary | ICD-10-CM | POA: Diagnosis not present

## 2022-02-16 DIAGNOSIS — N2581 Secondary hyperparathyroidism of renal origin: Secondary | ICD-10-CM | POA: Diagnosis not present

## 2022-02-16 DIAGNOSIS — Z992 Dependence on renal dialysis: Secondary | ICD-10-CM | POA: Diagnosis not present

## 2022-02-16 DIAGNOSIS — R079 Chest pain, unspecified: Secondary | ICD-10-CM | POA: Diagnosis not present

## 2022-02-16 DIAGNOSIS — N186 End stage renal disease: Secondary | ICD-10-CM | POA: Diagnosis not present

## 2022-02-18 DIAGNOSIS — N186 End stage renal disease: Secondary | ICD-10-CM | POA: Diagnosis not present

## 2022-02-18 DIAGNOSIS — Z992 Dependence on renal dialysis: Secondary | ICD-10-CM | POA: Diagnosis not present

## 2022-02-18 DIAGNOSIS — N2581 Secondary hyperparathyroidism of renal origin: Secondary | ICD-10-CM | POA: Diagnosis not present

## 2022-02-21 ENCOUNTER — Telehealth: Payer: Self-pay | Admitting: Family Medicine

## 2022-02-21 NOTE — Telephone Encounter (Signed)
Patient is requesting prescription for tramadol for chest discomfort he just got release from rehab/ [facility]

## 2022-02-22 NOTE — Telephone Encounter (Signed)
Left message to return call 

## 2022-02-22 NOTE — Telephone Encounter (Signed)
Tramadol does not get along with sertraline.  A different option would be low-dose hydrocodone to half a tablet when necessary caution drowsiness for occasional use if he is interested we can send that in.  He also needs to keep his follow-up visit on the 19th as planned. If he would rather discuss this further at a follow-up visit on the 19th that would be fine

## 2022-02-23 DIAGNOSIS — Z992 Dependence on renal dialysis: Secondary | ICD-10-CM | POA: Diagnosis not present

## 2022-02-23 DIAGNOSIS — N2581 Secondary hyperparathyroidism of renal origin: Secondary | ICD-10-CM | POA: Diagnosis not present

## 2022-02-23 DIAGNOSIS — C159 Malignant neoplasm of esophagus, unspecified: Secondary | ICD-10-CM | POA: Diagnosis not present

## 2022-02-23 DIAGNOSIS — N186 End stage renal disease: Secondary | ICD-10-CM | POA: Diagnosis not present

## 2022-02-24 NOTE — Telephone Encounter (Signed)
Pt returned call and states he can not take Hydrocodone-has a severe reaction. Pt states he would like to discuss this at appt on 03/01/22

## 2022-02-25 DIAGNOSIS — N2581 Secondary hyperparathyroidism of renal origin: Secondary | ICD-10-CM | POA: Diagnosis not present

## 2022-02-25 DIAGNOSIS — Z992 Dependence on renal dialysis: Secondary | ICD-10-CM | POA: Diagnosis not present

## 2022-02-25 DIAGNOSIS — N186 End stage renal disease: Secondary | ICD-10-CM | POA: Diagnosis not present

## 2022-02-28 ENCOUNTER — Encounter (HOSPITAL_COMMUNITY): Payer: Self-pay | Admitting: *Deleted

## 2022-02-28 ENCOUNTER — Inpatient Hospital Stay (HOSPITAL_COMMUNITY)
Admission: EM | Admit: 2022-02-28 | Discharge: 2022-03-08 | DRG: 291 | Disposition: A | Discharge status: Home Health | Payer: Medicare HMO | Attending: Internal Medicine | Admitting: Internal Medicine

## 2022-02-28 ENCOUNTER — Emergency Department (HOSPITAL_COMMUNITY): Payer: Medicare HMO

## 2022-02-28 ENCOUNTER — Observation Stay (HOSPITAL_COMMUNITY): Payer: Medicare HMO

## 2022-02-28 ENCOUNTER — Other Ambulatory Visit: Payer: Self-pay

## 2022-02-28 DIAGNOSIS — Z87891 Personal history of nicotine dependence: Secondary | ICD-10-CM

## 2022-02-28 DIAGNOSIS — E1159 Type 2 diabetes mellitus with other circulatory complications: Secondary | ICD-10-CM | POA: Diagnosis present

## 2022-02-28 DIAGNOSIS — R000 Tachycardia, unspecified: Secondary | ICD-10-CM | POA: Diagnosis not present

## 2022-02-28 DIAGNOSIS — Z992 Dependence on renal dialysis: Secondary | ICD-10-CM | POA: Diagnosis not present

## 2022-02-28 DIAGNOSIS — E1122 Type 2 diabetes mellitus with diabetic chronic kidney disease: Secondary | ICD-10-CM | POA: Diagnosis not present

## 2022-02-28 DIAGNOSIS — I5042 Chronic combined systolic (congestive) and diastolic (congestive) heart failure: Secondary | ICD-10-CM

## 2022-02-28 DIAGNOSIS — I10 Essential (primary) hypertension: Secondary | ICD-10-CM | POA: Diagnosis present

## 2022-02-28 DIAGNOSIS — R7881 Bacteremia: Secondary | ICD-10-CM | POA: Diagnosis not present

## 2022-02-28 DIAGNOSIS — G9341 Metabolic encephalopathy: Secondary | ICD-10-CM | POA: Diagnosis not present

## 2022-02-28 DIAGNOSIS — R4182 Altered mental status, unspecified: Secondary | ICD-10-CM | POA: Diagnosis not present

## 2022-02-28 DIAGNOSIS — G4733 Obstructive sleep apnea (adult) (pediatric): Secondary | ICD-10-CM | POA: Diagnosis not present

## 2022-02-28 DIAGNOSIS — J9690 Respiratory failure, unspecified, unspecified whether with hypoxia or hypercapnia: Secondary | ICD-10-CM | POA: Diagnosis not present

## 2022-02-28 DIAGNOSIS — J9601 Acute respiratory failure with hypoxia: Secondary | ICD-10-CM | POA: Diagnosis present

## 2022-02-28 DIAGNOSIS — K222 Esophageal obstruction: Secondary | ICD-10-CM | POA: Diagnosis not present

## 2022-02-28 DIAGNOSIS — Z794 Long term (current) use of insulin: Secondary | ICD-10-CM

## 2022-02-28 DIAGNOSIS — N186 End stage renal disease: Secondary | ICD-10-CM | POA: Diagnosis present

## 2022-02-28 DIAGNOSIS — I953 Hypotension of hemodialysis: Secondary | ICD-10-CM | POA: Diagnosis present

## 2022-02-28 DIAGNOSIS — D696 Thrombocytopenia, unspecified: Secondary | ICD-10-CM | POA: Diagnosis present

## 2022-02-28 DIAGNOSIS — Z66 Do not resuscitate: Secondary | ICD-10-CM | POA: Diagnosis not present

## 2022-02-28 DIAGNOSIS — R0609 Other forms of dyspnea: Secondary | ICD-10-CM | POA: Diagnosis not present

## 2022-02-28 DIAGNOSIS — I132 Hypertensive heart and chronic kidney disease with heart failure and with stage 5 chronic kidney disease, or end stage renal disease: Secondary | ICD-10-CM | POA: Diagnosis not present

## 2022-02-28 DIAGNOSIS — R0600 Dyspnea, unspecified: Secondary | ICD-10-CM | POA: Diagnosis not present

## 2022-02-28 DIAGNOSIS — C159 Malignant neoplasm of esophagus, unspecified: Secondary | ICD-10-CM

## 2022-02-28 DIAGNOSIS — R0689 Other abnormalities of breathing: Secondary | ICD-10-CM | POA: Diagnosis not present

## 2022-02-28 DIAGNOSIS — E119 Type 2 diabetes mellitus without complications: Secondary | ICD-10-CM | POA: Diagnosis present

## 2022-02-28 DIAGNOSIS — R042 Hemoptysis: Secondary | ICD-10-CM | POA: Diagnosis not present

## 2022-02-28 DIAGNOSIS — Z7189 Other specified counseling: Secondary | ICD-10-CM

## 2022-02-28 DIAGNOSIS — I471 Supraventricular tachycardia, unspecified: Secondary | ICD-10-CM | POA: Diagnosis not present

## 2022-02-28 DIAGNOSIS — I5020 Unspecified systolic (congestive) heart failure: Secondary | ICD-10-CM | POA: Diagnosis not present

## 2022-02-28 DIAGNOSIS — I472 Ventricular tachycardia, unspecified: Secondary | ICD-10-CM | POA: Diagnosis present

## 2022-02-28 DIAGNOSIS — I2510 Atherosclerotic heart disease of native coronary artery without angina pectoris: Secondary | ICD-10-CM | POA: Diagnosis not present

## 2022-02-28 DIAGNOSIS — Z7982 Long term (current) use of aspirin: Secondary | ICD-10-CM

## 2022-02-28 DIAGNOSIS — Z79899 Other long term (current) drug therapy: Secondary | ICD-10-CM

## 2022-02-28 DIAGNOSIS — R079 Chest pain, unspecified: Secondary | ICD-10-CM | POA: Diagnosis not present

## 2022-02-28 DIAGNOSIS — K209 Esophagitis, unspecified without bleeding: Secondary | ICD-10-CM | POA: Diagnosis not present

## 2022-02-28 DIAGNOSIS — J9621 Acute and chronic respiratory failure with hypoxia: Secondary | ICD-10-CM

## 2022-02-28 DIAGNOSIS — B3781 Candidal esophagitis: Secondary | ICD-10-CM | POA: Diagnosis present

## 2022-02-28 DIAGNOSIS — D649 Anemia, unspecified: Secondary | ICD-10-CM | POA: Diagnosis present

## 2022-02-28 DIAGNOSIS — I5022 Chronic systolic (congestive) heart failure: Secondary | ICD-10-CM | POA: Diagnosis not present

## 2022-02-28 DIAGNOSIS — E785 Hyperlipidemia, unspecified: Secondary | ICD-10-CM | POA: Diagnosis not present

## 2022-02-28 DIAGNOSIS — I5043 Acute on chronic combined systolic (congestive) and diastolic (congestive) heart failure: Secondary | ICD-10-CM | POA: Diagnosis not present

## 2022-02-28 DIAGNOSIS — Z951 Presence of aortocoronary bypass graft: Secondary | ICD-10-CM | POA: Diagnosis not present

## 2022-02-28 DIAGNOSIS — I255 Ischemic cardiomyopathy: Secondary | ICD-10-CM | POA: Diagnosis present

## 2022-02-28 DIAGNOSIS — Z833 Family history of diabetes mellitus: Secondary | ICD-10-CM

## 2022-02-28 DIAGNOSIS — R011 Cardiac murmur, unspecified: Secondary | ICD-10-CM | POA: Diagnosis not present

## 2022-02-28 DIAGNOSIS — Z8501 Personal history of malignant neoplasm of esophagus: Secondary | ICD-10-CM

## 2022-02-28 DIAGNOSIS — R1319 Other dysphagia: Secondary | ICD-10-CM | POA: Diagnosis not present

## 2022-02-28 DIAGNOSIS — Z800 Family history of malignant neoplasm of digestive organs: Secondary | ICD-10-CM

## 2022-02-28 DIAGNOSIS — J449 Chronic obstructive pulmonary disease, unspecified: Secondary | ICD-10-CM | POA: Diagnosis present

## 2022-02-28 DIAGNOSIS — K219 Gastro-esophageal reflux disease without esophagitis: Secondary | ICD-10-CM | POA: Diagnosis present

## 2022-02-28 DIAGNOSIS — R1310 Dysphagia, unspecified: Secondary | ICD-10-CM | POA: Diagnosis not present

## 2022-02-28 DIAGNOSIS — Z8673 Personal history of transient ischemic attack (TIA), and cerebral infarction without residual deficits: Secondary | ICD-10-CM

## 2022-02-28 DIAGNOSIS — I130 Hypertensive heart and chronic kidney disease with heart failure and stage 1 through stage 4 chronic kidney disease, or unspecified chronic kidney disease: Secondary | ICD-10-CM | POA: Diagnosis not present

## 2022-02-28 DIAGNOSIS — R0602 Shortness of breath: Principal | ICD-10-CM

## 2022-02-28 DIAGNOSIS — Z8249 Family history of ischemic heart disease and other diseases of the circulatory system: Secondary | ICD-10-CM

## 2022-02-28 DIAGNOSIS — E1151 Type 2 diabetes mellitus with diabetic peripheral angiopathy without gangrene: Secondary | ICD-10-CM | POA: Diagnosis present

## 2022-02-28 DIAGNOSIS — J9691 Respiratory failure, unspecified with hypoxia: Secondary | ICD-10-CM | POA: Diagnosis not present

## 2022-02-28 DIAGNOSIS — I252 Old myocardial infarction: Secondary | ICD-10-CM

## 2022-02-28 DIAGNOSIS — J90 Pleural effusion, not elsewhere classified: Secondary | ICD-10-CM | POA: Diagnosis not present

## 2022-02-28 DIAGNOSIS — K449 Diaphragmatic hernia without obstruction or gangrene: Secondary | ICD-10-CM | POA: Diagnosis not present

## 2022-02-28 DIAGNOSIS — I509 Heart failure, unspecified: Secondary | ICD-10-CM

## 2022-02-28 DIAGNOSIS — J918 Pleural effusion in other conditions classified elsewhere: Secondary | ICD-10-CM | POA: Diagnosis present

## 2022-02-28 DIAGNOSIS — R059 Cough, unspecified: Secondary | ICD-10-CM | POA: Diagnosis not present

## 2022-02-28 DIAGNOSIS — N2581 Secondary hyperparathyroidism of renal origin: Secondary | ICD-10-CM | POA: Diagnosis present

## 2022-02-28 DIAGNOSIS — B9689 Other specified bacterial agents as the cause of diseases classified elsewhere: Secondary | ICD-10-CM | POA: Diagnosis present

## 2022-02-28 DIAGNOSIS — R0902 Hypoxemia: Secondary | ICD-10-CM | POA: Diagnosis not present

## 2022-02-28 DIAGNOSIS — R846 Abnormal cytological findings in specimens from respiratory organs and thorax: Secondary | ICD-10-CM | POA: Diagnosis not present

## 2022-02-28 DIAGNOSIS — J9811 Atelectasis: Secondary | ICD-10-CM | POA: Diagnosis not present

## 2022-02-28 HISTORY — DX: Acute and chronic respiratory failure with hypoxia: J96.21

## 2022-02-28 LAB — CBC WITH DIFFERENTIAL/PLATELET
Abs Immature Granulocytes: 0.03 10*3/uL (ref 0.00–0.07)
Basophils Absolute: 0 10*3/uL (ref 0.0–0.1)
Basophils Relative: 0 %
Eosinophils Absolute: 0 10*3/uL (ref 0.0–0.5)
Eosinophils Relative: 0 %
HCT: 35 % — ABNORMAL LOW (ref 39.0–52.0)
Hemoglobin: 10.7 g/dL — ABNORMAL LOW (ref 13.0–17.0)
Immature Granulocytes: 0 %
Lymphocytes Relative: 5 %
Lymphs Abs: 0.4 10*3/uL — ABNORMAL LOW (ref 0.7–4.0)
MCH: 26.8 pg (ref 26.0–34.0)
MCHC: 30.6 g/dL (ref 30.0–36.0)
MCV: 87.5 fL (ref 80.0–100.0)
Monocytes Absolute: 0.1 10*3/uL (ref 0.1–1.0)
Monocytes Relative: 1 %
Neutro Abs: 8.2 10*3/uL — ABNORMAL HIGH (ref 1.7–7.7)
Neutrophils Relative %: 94 %
Platelets: 198 10*3/uL (ref 150–400)
RBC: 4 MIL/uL — ABNORMAL LOW (ref 4.22–5.81)
RDW: 17.1 % — ABNORMAL HIGH (ref 11.5–15.5)
WBC: 8.8 10*3/uL (ref 4.0–10.5)
nRBC: 0.2 % (ref 0.0–0.2)

## 2022-02-28 LAB — BLOOD GAS, VENOUS
Acid-base deficit: 2.5 mmol/L — ABNORMAL HIGH (ref 0.0–2.0)
Bicarbonate: 23.2 mmol/L (ref 20.0–28.0)
Drawn by: 58332
FIO2: 40 %
O2 Saturation: 31.7 %
Patient temperature: 36.9
pCO2, Ven: 43 mmHg — ABNORMAL LOW (ref 44–60)
pH, Ven: 7.34 (ref 7.25–7.43)
pO2, Ven: <31 mmHg — CL (ref 32–45)

## 2022-02-28 LAB — URINALYSIS, ROUTINE W REFLEX MICROSCOPIC
Bacteria, UA: NONE SEEN
Bilirubin Urine: NEGATIVE
Glucose, UA: >=500 mg/dL — AB
Hgb urine dipstick: NEGATIVE
Ketones, ur: NEGATIVE mg/dL
Leukocytes,Ua: NEGATIVE
Nitrite: NEGATIVE
Protein, ur: >=300 mg/dL — AB
Specific Gravity, Urine: 1.014 (ref 1.005–1.030)
pH: 5 (ref 5.0–8.0)

## 2022-02-28 LAB — COMPREHENSIVE METABOLIC PANEL
ALT: 32 U/L (ref 0–44)
AST: 33 U/L (ref 15–41)
Albumin: 4 g/dL (ref 3.5–5.0)
Alkaline Phosphatase: 135 U/L — ABNORMAL HIGH (ref 38–126)
Anion gap: 19 — ABNORMAL HIGH (ref 5–15)
BUN: 48 mg/dL — ABNORMAL HIGH (ref 8–23)
CO2: 20 mmol/L — ABNORMAL LOW (ref 22–32)
Calcium: 8.7 mg/dL — ABNORMAL LOW (ref 8.9–10.3)
Chloride: 99 mmol/L (ref 98–111)
Creatinine, Ser: 4.47 mg/dL — ABNORMAL HIGH (ref 0.61–1.24)
GFR, Estimated: 14 mL/min — ABNORMAL LOW (ref >60)
Glucose, Bld: 176 mg/dL — ABNORMAL HIGH (ref 70–99)
Potassium: 4.4 mmol/L (ref 3.5–5.1)
Sodium: 138 mmol/L (ref 135–145)
Total Bilirubin: 1 mg/dL (ref 0.3–1.2)
Total Protein: 7.6 g/dL (ref 6.5–8.1)

## 2022-02-28 LAB — TROPONIN I (HIGH SENSITIVITY)
Troponin I (High Sensitivity): 51 ng/L — ABNORMAL HIGH (ref <18)
Troponin I (High Sensitivity): 52 ng/L — ABNORMAL HIGH (ref <18)

## 2022-02-28 LAB — LACTIC ACID, PLASMA
Lactic Acid, Venous: 3.5 mmol/L (ref 0.5–1.9)
Lactic Acid, Venous: 1.9 mmol/L (ref 0.5–1.9)

## 2022-02-28 LAB — PROTIME-INR
INR: 1.4 — ABNORMAL HIGH (ref 0.8–1.2)
Prothrombin Time: 16.6 seconds — ABNORMAL HIGH (ref 11.4–15.2)

## 2022-02-28 LAB — BRAIN NATRIURETIC PEPTIDE: B Natriuretic Peptide: >4500 pg/mL — ABNORMAL HIGH (ref 0.0–100.0)

## 2022-02-28 MED ORDER — SODIUM CHLORIDE 0.9 % IV SOLN
500.0000 mg | INTRAVENOUS | Status: DC
  Administered 2022-02-28: 500 mg via INTRAVENOUS
  Filled 2022-02-28: qty 5

## 2022-02-28 MED ORDER — SERTRALINE HCL 50 MG PO TABS
100.0000 mg | ORAL_TABLET | Freq: Every day | ORAL | Status: DC
  Administered 2022-03-01 – 2022-03-08 (×8): 100 mg via ORAL
  Filled 2022-02-28 (×7): qty 2

## 2022-02-28 MED ORDER — ONDANSETRON HCL 4 MG/2ML IJ SOLN
4.0000 mg | Freq: Four times a day (QID) | INTRAMUSCULAR | Status: DC | PRN
  Administered 2022-03-01 – 2022-03-06 (×5): 4 mg via INTRAVENOUS
  Filled 2022-02-28 (×6): qty 2

## 2022-02-28 MED ORDER — ISOSORBIDE DINITRATE 20 MG PO TABS
30.0000 mg | ORAL_TABLET | Freq: Three times a day (TID) | ORAL | Status: DC
  Administered 2022-02-28 – 2022-03-08 (×18): 30 mg via ORAL
  Filled 2022-02-28 (×9): qty 1
  Filled 2022-02-28: qty 2
  Filled 2022-02-28: qty 1
  Filled 2022-02-28: qty 2
  Filled 2022-02-28 (×8): qty 1
  Filled 2022-02-28: qty 2

## 2022-02-28 MED ORDER — ONDANSETRON HCL 4 MG PO TABS
4.0000 mg | ORAL_TABLET | Freq: Four times a day (QID) | ORAL | Status: DC | PRN
  Administered 2022-03-02: 4 mg via ORAL
  Filled 2022-02-28: qty 1

## 2022-02-28 MED ORDER — INSULIN ASPART 100 UNIT/ML IJ SOLN
0.0000 [IU] | Freq: Three times a day (TID) | INTRAMUSCULAR | Status: DC
  Administered 2022-03-01: 2 [IU] via SUBCUTANEOUS
  Administered 2022-03-01 – 2022-03-02 (×3): 3 [IU] via SUBCUTANEOUS
  Administered 2022-03-02 (×2): 2 [IU] via SUBCUTANEOUS
  Administered 2022-03-03: 1 [IU] via SUBCUTANEOUS
  Administered 2022-03-03 (×2): 2 [IU] via SUBCUTANEOUS
  Administered 2022-03-04 (×2): 1 [IU] via SUBCUTANEOUS
  Administered 2022-03-05 – 2022-03-06 (×5): 2 [IU] via SUBCUTANEOUS
  Administered 2022-03-06: 1 [IU] via SUBCUTANEOUS
  Administered 2022-03-07 (×2): 2 [IU] via SUBCUTANEOUS
  Administered 2022-03-08: 3 [IU] via SUBCUTANEOUS

## 2022-02-28 MED ORDER — IPRATROPIUM-ALBUTEROL 0.5-2.5 (3) MG/3ML IN SOLN: 6.0000 mL | Freq: Once | RESPIRATORY_TRACT | Status: DC

## 2022-02-28 MED ORDER — SODIUM CHLORIDE 0.9 % IV SOLN
2.0000 g | INTRAVENOUS | Status: DC
  Administered 2022-02-28: 2 g via INTRAVENOUS
  Filled 2022-02-28: qty 20

## 2022-02-28 MED ORDER — ASPIRIN 81 MG PO TBEC
325.0000 mg | DELAYED_RELEASE_TABLET | Freq: Every day | ORAL | Status: DC
  Filled 2022-02-28 (×2): qty 1

## 2022-02-28 MED ORDER — CARVEDILOL 12.5 MG PO TABS
12.5000 mg | ORAL_TABLET | Freq: Two times a day (BID) | ORAL | Status: DC
  Administered 2022-02-28: 12.5 mg via ORAL
  Filled 2022-02-28: qty 1

## 2022-02-28 MED ORDER — ALBUTEROL SULFATE (2.5 MG/3ML) 0.083% IN NEBU
INHALATION_SOLUTION | RESPIRATORY_TRACT | Status: AC
  Filled 2022-02-28: qty 6

## 2022-02-28 MED ORDER — CALCIUM ACETATE (PHOS BINDER) 667 MG PO CAPS
667.0000 mg | ORAL_CAPSULE | Freq: Three times a day (TID) | ORAL | Status: DC
  Administered 2022-03-01 – 2022-03-07 (×14): 667 mg via ORAL
  Filled 2022-02-28 (×16): qty 1

## 2022-02-28 MED ORDER — INSULIN ASPART 100 UNIT/ML IJ SOLN
0.0000 [IU] | Freq: Every day | INTRAMUSCULAR | Status: DC
  Administered 2022-03-06 – 2022-03-07 (×2): 3 [IU] via SUBCUTANEOUS

## 2022-02-28 MED ORDER — ASPIRIN 81 MG PO CHEW
324.0000 mg | CHEWABLE_TABLET | Freq: Once | ORAL | Status: AC
  Administered 2022-02-28: 324 mg via ORAL
  Filled 2022-02-28: qty 4

## 2022-02-28 MED ORDER — DOXAZOSIN MESYLATE 2 MG PO TABS
4.0000 mg | ORAL_TABLET | Freq: Every day | ORAL | Status: DC
  Administered 2022-03-01 – 2022-03-08 (×8): 4 mg via ORAL
  Filled 2022-02-28 (×8): qty 2

## 2022-02-28 MED ORDER — HEPARIN SODIUM (PORCINE) 5000 UNIT/ML IJ SOLN
5000.0000 [IU] | Freq: Three times a day (TID) | INTRAMUSCULAR | Status: AC
  Administered 2022-02-28 – 2022-03-07 (×22): 5000 [IU] via SUBCUTANEOUS
  Filled 2022-02-28 (×22): qty 1

## 2022-02-28 MED ORDER — IOHEXOL 350 MG/ML SOLN
100.0000 mL | Freq: Once | INTRAVENOUS | Status: AC | PRN
  Administered 2022-02-28: 75 mL via INTRAVENOUS

## 2022-02-28 MED ORDER — NITROGLYCERIN IN D5W 200-5 MCG/ML-% IV SOLN
5.0000 ug/min | INTRAVENOUS | Status: DC
  Administered 2022-02-28: 5 ug/min via INTRAVENOUS
  Filled 2022-02-28: qty 250

## 2022-02-28 MED ORDER — POLYETHYLENE GLYCOL 3350 17 G PO PACK: 17.0000 g | PACK | Freq: Every day | ORAL | Status: DC | PRN

## 2022-02-28 MED ORDER — TRAZODONE HCL 50 MG PO TABS
25.0000 mg | ORAL_TABLET | Freq: Every evening | ORAL | Status: DC | PRN
  Administered 2022-03-03 (×2): 50 mg via ORAL
  Filled 2022-02-28 (×2): qty 1

## 2022-02-28 NOTE — Assessment & Plan Note (Signed)
Systolic and diastolic CHF.  At this time appears stable and compensated.  He reports he has not missed any HD sessions.  Last echo 07/2021, EF of 40 to 45%. -Per HD

## 2022-02-28 NOTE — ED Notes (Signed)
Pt in bed, pt on bipap, pt tolerating well, pt only able to talk in one to two word sentences.

## 2022-02-28 NOTE — H&P (Addendum)
History and Physical    LINDEL MARCELL QBH:[id number] DOB: 1956-02-24 DOA: 02/28/2022  PCP: [name], MD   Patient coming from: Home  I have personally briefly reviewed patient's old medical records in [place]  Chief Complaint: Difficulty breathing  HPI: [name] is a 66 y.o. male with medical history significant for CABG, ESRD, cardiomyopathy with systolic and diastolic CHF, stroke, hypertension, diabetes mellitus. Patient was brought to the ED from dialysis center with reports of worsening of his difficulty breathing.  At the time of my evaluation, patient is on BiPAP. Patient was having dialysis today and was 1 and 54 minutes into session when it had to be stopped.  He was having significant worsening of his breathing, with use of accessory muscle, was placed on 4 L nasal cannula, and BiPAP here. Patient reports has been having difficulty breathing over the past 2 days.  He reported cough productive of whitish phlegm.  On arrival to the ED there was some wheezing.  Recent hospitalization 8/19 to 8/25 for chest pain, transferred to [facility], due to history of previous CABG.  Underwent cardiac cath 8/22 which showed patent CABG.  ED Course: Temperature 98.9.  Initial heart rate 103 improved to 70s, respiratory rate initially up to 30 improved after BiPAP, blood pressure 120s to 170s.  O2 sats 90% on 4 L nasal cannula, subsequently switched to BiPAP due to tachycardia, tachypnea, accessory muscle use and increased work of breathing. Chest x-ray with mild cardiomegaly without overt pulmonary edema, small right pleural effusion slightly increased, hazy right lung base opacity favor atelectasis. VBG with pH of 7.3, PCO2 of 43.  Lactic acid 3.5.  Troponin 51.  IV ceftriaxone and azithromycin started. Initial concern for flash pulmonary edema, hence nitroglycerin drip initially started and later discontinued with improvement in respiratory status.  Review of Systems:  As per HPI all other systems reviewed and negative.  Past Medical History:  Diagnosis Date   Anemia    Arthritis    hips shoulders   CAD (coronary artery disease) 03/03/2019   Cancer ([place])    Esophageal cancer 2022   CHF (congestive heart failure) ([place]) 2020   Chronic combined systolic and diastolic heart failure ([place]) 04/26/2018   Admitted 11/14-11/20/19-diuresed 10L   Depression 05/31/2021   Fatigue 10/14/2020   Hypertension    Myocardial infarction [facility])    Sleep apnea    uses a bipap machine   Stroke ([place]) 12/14/2020   no last weakness or paralysis   Type 2 diabetes mellitus with diabetic nephropathy ([place]) 04/23/2019   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    Past Surgical History:  Procedure Laterality Date   AV FISTULA PLACEMENT Left 12/21/2021   Procedure: LEFT ARM ARTERIOVENOUS (AV) FISTULA CREATION;  Surgeon: Rosetta Posner, MD;  Location: [facility];  Service: Vascular;  Laterality: Left;   BIOPSY  06/27/2019   Procedure: BIOPSY;  Surgeon: Rogene Houston, MD;  Location: [facility];  Service: Endoscopy;;  ascending colon polyp   BIOPSY  10/21/2020   Procedure: BIOPSY;  Surgeon: Rogene Houston, MD;  [facility];  Service: Endoscopy;;  esophageal,gastric polyp   BIOPSY  02/24/2021   Procedure: BIOPSY;  Surgeon: Rogene Houston, MD;  [facility];  Service: Endoscopy;;  distal and proximal esophageal biopsies    CARDIAC SURGERY     CATARACT EXTRACTION     COLONOSCOPY N/A 06/27/2019   [name]:Diverticulosis in the entire examined colon. tubular  adenoma in ascending colon, 13m tubular adenoma in prox sigmoid. external hemorrhoids   CORONARY ARTERY BYPASS GRAFT N/A 03/25/2019   Procedure: CORONARY ARTERY BYPASS GRAFTING (CABG) X  4 USING LEFT INTERNAL MAMMARY ARTERY AND RIGHT SAPHENOUS VEIN GRAFTS;  Surgeon: LLajuana Matte MD;  [place] [facility]  Service: Open Heart Surgery;  Laterality: N/A;   ESOPHAGOGASTRODUODENOSCOPY (EGD) WITH PROPOFOL N/A  10/21/2020   rehman:Normal hypopharynx.Normal proximal esophagus and mid esophagus.Esophageal mucosal changes consistent with long-segment Barrett's esophagus. (Focal low-grade dysplasia and atypia proximally) z line irregular 37 cm from incisors, 3 cm HH, single gastric polyp (fundic gland) normal duodenal bulb/second portion of duodenum, proximal margin of Barrett's at 34 cm   ESOPHAGOGASTRODUODENOSCOPY (EGD) WITH PROPOFOL N/A 02/24/2021   Procedure: ESOPHAGOGASTRODUODENOSCOPY (EGD) WITH PROPOFOL;  Surgeon: RRogene Houston MD;  [facility];  Service: Endoscopy;  Laterality: N/A;  1:35   ESOPHAGOGASTRODUODENOSCOPY (EGD) WITH PROPOFOL N/A 03/21/2021   Procedure: ESOPHAGOGASTRODUODENOSCOPY (EGD) WITH PROPOFOL;  Surgeon: HCarol Ada MD;  [facility]: [facility]  Service: Endoscopy;  Laterality: N/A;   ESOPHAGOGASTRODUODENOSCOPY (EGD) WITH PROPOFOL N/A 10/23/2021   Procedure: ESOPHAGOGASTRODUODENOSCOPY (EGD) WITH PROPOFOL;  Surgeon: DDoran Stabler MD;  [place] [facility]  Service: Gastroenterology;  Laterality: N/A;   EXCISION MORTON'S NEUROMA     EYE SURGERY Left    retina   INCISION AND DRAINAGE OF WOUND Right 06/28/2021   Procedure: IRRIGATION AND DEBRIDEMENT WOUND;  Surgeon: PCindra Presume MD;  [place] [facility]  Service: Plastics;  Laterality: Right;  1.5 hour   INSERTION OF DIALYSIS CATHETER Right 12/21/2021   Procedure: INSERTION OF TUNNELED DIALYSIS CATHETER;  Surgeon: ERosetta Posner MD;  Location: [facility];  Service: Vascular;  Laterality: Right;   LEFT HEART CATH AND CORS/GRAFTS ANGIOGRAPHY N/A 02/01/2022   Procedure: LEFT HEART CATH AND CORS/GRAFTS ANGIOGRAPHY;  Surgeon: JMartinique Peter M, MD;  [facility];  Service: Cardiovascular;  Laterality: N/A;   POLYPECTOMY  06/27/2019   Procedure: POLYPECTOMY;  Surgeon: RRogene Houston MD;  [facility];  Service: Endoscopy;;  proximal sigmoid colon   RIGHT/LEFT HEART CATH AND CORONARY ANGIOGRAPHY N/A  03/12/2019   Procedure: RIGHT/LEFT HEART CATH AND CORONARY ANGIOGRAPHY;  Surgeon: VJettie Booze MD;  [place] [facility];  Service: Cardiovascular;  Laterality: N/A;   SHOULDER ARTHROSCOPY WITH ROTATOR CUFF REPAIR AND SUBACROMIAL DECOMPRESSION Right 08/26/2020   Procedure: RIGHT SHOULDER MINI OPEN ROTATOR CUFF REPAIR AND SUBACROMIAL DECOMPRESSION WITH PATCH GRAFT;  Surgeon: BSusa Day MD;  Location: [facility];  Service: Orthopedics;  Laterality: Right;  90 MINS GENERAL WITH BLOCK   SKIN SPLIT GRAFT Right 06/28/2021   Procedure: SKIN GRAFT SPLIT THICKNESS;  Surgeon: PCindra Presume MD;  [place] [facility]  Service: Plastics;  Laterality: Right;   TEE WITHOUT CARDIOVERSION N/A 03/25/2019   Procedure: TRANSESOPHAGEAL ECHOCARDIOGRAM (TEE);  Surgeon: LLajuana Matte MD;  [place] [facility] [place]  Service: Open Heart Surgery;  Laterality: N/A;     reports that he quit smoking about 33 years ago. His smoking use included cigarettes. He has never used smokeless tobacco. He reports that he does not currently use alcohol after a past usage of about 2.0 standard drinks of alcohol per week. He reports that he does not use drugs.  Allergies  Allergen Reactions   Bee Venom Anaphylaxis   Atorvastatin Other (See Comments)    myalgia   Diltiazem Itching   Reglan [Metoclopramide] Other (See Comments)    Suicidal  Rosuvastatin Other (See Comments)    myalgias   Valsartan Itching    Family History  Problem Relation Age of Onset   Colon cancer Mother    Heart Problems Father    Diabetes Father    Valvular heart disease Father    Sleep apnea Neg Hx    Stroke Neg Hx     Prior to Admission medications   Medication Sig Start Date End Date Taking? Authorizing Provider  hydrALAZINE (APRESOLINE) 100 MG tablet Take by mouth. 02/12/22  Yes [provider]  insulin aspart (NOVOLOG FLEXPEN) 100 UNIT/ML FlexPen Inject into the skin. 02/12/22  Yes [provider]  sertraline  (ZOLOFT) 100 MG tablet Take by mouth. 02/03/22  Yes [provider]  acetaminophen (TYLENOL) 325 MG tablet Take 2 tablets (650 mg total) by mouth every 6 (six) hours as needed for mild pain, moderate pain or fever. 08/02/21   Allie Bossier, MD  albuterol (VENTOLIN HFA) 108 (90 Base) MCG/ACT inhaler Inhale 2 puffs into the lungs every 6 (six) hours as needed. Patient taking differently: Inhale 2 puffs into the lungs every 6 (six) hours as needed for wheezing or shortness of breath. 06/24/21   Coral Spikes, DO  ARIPiprazole (ABILIFY) 5 MG tablet Take 5 mg by mouth daily.    [provider]  ascorbic acid (VITAMIN C) 500 MG tablet Take 1 tablet (500 mg total) by mouth daily. 08/03/21   Allie Bossier, MD  aspirin EC 325 MG tablet Take 325 mg by mouth daily. 02/21/22   [provider]  calcium acetate (PHOSLO) 667 MG capsule Take 667 mg by mouth in the morning, at noon, and at bedtime.    [provider]  calcium-vitamin D (OSCAL WITH D) 500-5 MG-MCG tablet Take 1 tablet by mouth 2 (two) times daily.    [provider]  carvedilol (COREG) 12.5 MG tablet Take 12.5 mg by mouth 2 (two) times daily. 02/21/22   [provider]  cholecalciferol (VITAMIN D) 25 MCG (1000 UNIT) tablet Take 1,000 Units by mouth daily.    [provider]  doxazosin (CARDURA) 4 MG tablet Take 1 tablet (4 mg total) by mouth daily. 09/14/21   Loel Dubonnet, NP  EPINEPHrine 0.3 mg/0.3 mL IJ SOAJ injection Inject 0.3 mg into the muscle as needed for anaphylaxis. 01/25/21   [name], MD  hydrALAZINE (APRESOLINE) 100 MG tablet Take 1 tablet (100 mg total) by mouth 3 (three) times daily. Patient taking differently: Take 100 mg by mouth See admin instructions. Take 3 times a day on non-dialysis days for HTN. (Tue, Thu, Sat, Sun) 11/01/21   Loel Dubonnet, NP  hydrOXYzine (VISTARIL) 25 MG capsule TAKE ONE CAPSULE BY MOUTH NIGHTLY AS NEEDED FOR ALLERGIES AND  INSOMNIA Patient taking differently: Take 25 mg by mouth at bedtime as needed (allerigies and insomnia). 11/01/21   [name], MD  insulin aspart (NOVOLOG) 100 UNIT/ML injection Inject 0-15 Units into the skin 3 (three) times daily before meals. Per sliding scale    [provider]  Insulin Pen Needle (B-D ULTRAFINE III SHORT PEN) 31G X 8 MM MISC 1 each by Does not apply route as directed. 05/30/19   Cassandria Anger, MD  isosorbide dinitrate (ISORDIL) 30 MG tablet Take 30 mg by mouth 3 (three) times daily. 02/21/22   [provider]  isosorbide mononitrate (IMDUR) 30 MG 24 hr tablet Take 1 tablet (30 mg total) by mouth daily. 02/04/22  Regalado, Belkys A, MD  lidocaine (ASPERCREME LIDOCAINE) 4 % Place 1 patch onto the skin daily. Wear for 12 hours then remove, wait 12 hours before applying a new patch to lower back    [provider]  metoprolol tartrate (LOPRESSOR) 50 MG tablet Take 1 tablet (50 mg total) by mouth 2 (two) times daily. 02/03/22   Regalado, Belkys A, MD  Multiple Vitamins-Minerals (THERATRUM COMPLETE) TABS Take 1 tablet by mouth daily.    [provider]  nitroGLYCERIN (NITROSTAT) 0.4 MG SL tablet Place 1 tablet (0.4 mg total) under the tongue every 5 (five) minutes as needed for chest pain. 09/14/21   Loel Dubonnet, NP  Nutritional Supplements (FEEDING SUPPLEMENT, NEPRO CARB STEADY,) LIQD Take 237 mLs by mouth 2 (two) times daily with a meal.    [provider]  omeprazole (PRILOSEC) 40 MG capsule Take 40 mg by mouth in the morning and at bedtime.    [provider]  ondansetron (ZOFRAN) 4 MG tablet Take 4 mg by mouth every 8 (eight) hours as needed for nausea or vomiting.    [provider]  ondansetron (ZOFRAN-ODT) 4 MG disintegrating tablet Take 4 mg by mouth every 8 (eight) hours as needed. 02/20/22   [provider]  oxyCODONE-acetaminophen (PERCOCET/ROXICET) 5-325 MG tablet Take 1 tablet by mouth  every 6 (six) hours as needed. 02/21/22   [provider]  REPATHA SURECLICK 798 MG/ML SOAJ Inject 140 mg as directed every 14 (fourteen) days. Patient taking differently: Inject 140 mg as directed every 14 (fourteen) days. Starting 02/08/22 09/14/21   Loel Dubonnet, NP  sertraline (ZOLOFT) 100 MG tablet 1 daily 02/11/22   [name], MD  torsemide (DEMADEX) 20 MG tablet Take 40 mg by mouth daily. 02/21/22   [provider]  traZODone (DESYREL) 50 MG tablet Take 0.5-1 tablets (25-50 mg total) by mouth at bedtime as needed for sleep. 02/11/22   [name], MD    Physical Exam: Vitals:   02/28/22 1420 02/28/22 1500 02/28/22 1520 02/28/22 1626  BP: 130/64 137/71 133/68 123/72  Pulse: 79 79 76   Resp: (!) 24 (!) 24 (!) 22   Temp:      TempSrc:      SpO2: 97% 98% 97%   Weight:      Height:        Constitutional: On BiPAP, calm Vitals:   02/28/22 1420 02/28/22 1500 02/28/22 1520 02/28/22 1626  BP: 130/64 137/71 133/68 123/72  Pulse: 79 79 76   Resp: (!) 24 (!) 24 (!) 22   Temp:      TempSrc:      SpO2: 97% 98% 97%   Weight:      Height:       Eyes: PERRL, lids and conjunctivae normal ENMT: Mucous membranes are moist. .  Neck: normal, supple, no masses, no thyromegaly Respiratory: On BiPAP, clear to auscultation bilaterally, no wheezing, no crackles.  Some use of abdominal accessory muscles.  Cardiovascular: Regular rate and rhythm, no murmurs / rubs / gallops. No extremity edema. 2+ pedal pulses.  Abdomen: no tenderness, no masses palpated. No hepatosplenomegaly. Bowel sounds positive.  Musculoskeletal: no clubbing / cyanosis. No joint deformity upper and lower extremities.  Skin: no rashes, lesions, ulcers. No induration Neurologic: No apparent cranial nerve abnormality, moving extremities spontaneously.Marland Kitchen  Psychiatric: Normal judgment and insight. Alert and oriented x 3. Normal mood.   Labs on Admission: I have personally reviewed following labs and  imaging  studies  CBC: Recent Labs  Lab 02/28/22 1238  WBC 8.8  NEUTROABS 8.2*  HGB 10.7*  HCT 35.0*  MCV 87.5  PLT 448   Basic Metabolic Panel: Recent Labs  Lab 02/28/22 1238  NA 138  K 4.4  CL 99  CO2 20*  GLUCOSE 176*  BUN 48*  CREATININE 4.47*  CALCIUM 8.7*   GFR: Estimated Creatinine Clearance: 17 mL/min (A) (by C-G formula based on SCr of 4.47 mg/dL (H)). Liver Function Tests: Recent Labs  Lab 02/28/22 1238  AST 33  ALT 32  ALKPHOS 135*  BILITOT 1.0  PROT 7.6  ALBUMIN 4.0   Coagulation Profile: Recent Labs  Lab 02/28/22 1238  INR 1.4*   Urine analysis:    Component Value Date/Time   COLORURINE YELLOW 02/28/2022 1414   APPEARANCEUR CLEAR 02/28/2022 1414   LABSPEC 1.014 02/28/2022 1414   PHURINE 5.0 02/28/2022 1414   GLUCOSEU >=500 (A) 02/28/2022 1414   HGBUR NEGATIVE 02/28/2022 1414   BILIRUBINUR NEGATIVE 02/28/2022 1414   KETONESUR NEGATIVE 02/28/2022 1414   PROTEINUR >=300 (A) 02/28/2022 1414   NITRITE NEGATIVE 02/28/2022 1414   LEUKOCYTESUR NEGATIVE 02/28/2022 1414    Radiological Exams on Admission: DG Chest Port 1 View  Result Date: 02/28/2022 CLINICAL DATA:  Chest pain and dyspnea EXAM: PORTABLE CHEST 1 VIEW COMPARISON:  01/29/2022 chest radiograph. FINDINGS: Intact sternotomy wires. Pacer pad overlies the left chest. Right internal jugular central venous catheter terminates over the cavoatrial junction. Stable cardiomediastinal silhouette with mild cardiomegaly. No pneumothorax. Small right pleural effusion is slightly increased. No left pleural effusion. Slightly low lung volumes, similar. Cephalization of the pulmonary vasculature without overt pulmonary edema. Mild hazy right lung base opacity is mildly increased. IMPRESSION: 1. Stable mild cardiomegaly without overt pulmonary edema. 2. Small right pleural effusion, slightly increased. 3. Mildly increased hazy right lung base opacity, favor atelectasis. Electronically Signed   By: Ilona Sorrel M.D.   On: 02/28/2022 13:40    EKG: Independently reviewed.  Artifacts present.  Read as atrial flutter, but P waves are present, rate 104.  QTc 484.  Repeat EKG  Assessment/Plan Principal Problem:   Acute respiratory failure with hypoxia (HCC) Active Problems:   Essential hypertension, benign   DM type 2 causing vascular disease (HCC)   S/P CABG (coronary artery bypass graft)   Obstructive sleep apnea   CHF (congestive heart failure) (HCC)   ESRD on dialysis ([place])  Assessment and Plan: * Acute respiratory failure with hypoxia (HCC) Difficulty breathing over the past 2 days, significantly worse today during HD.  O2 sats 90% on 4 L, placed on BiPAP due to increased work of breathing.  Respiratory status has improved.  He is calm.  Reported initial wheezing, but on my exam lungs are clear.  No documented history of COPD.  Quit smoking cigarettes about 33 years ago.  EDP was concerned about flash pulmonary edema, so nitro drip was started.  Chest x-ray at this time-shows stable Mild cardiomegaly without overt pulmonary edema, small right pleural effusion slightly increased, right lung base opacity favors atelectasis. -Started on BiPAP and nitroglycerin drip which was discontinued after improvement in respiratory status. -Hold off on further antibiotics at this time -Obtain CTA chest, I talked to nephrologist Dr. [name], okay to administer IV contrast at this time -Lactic acid 3.5 > 1.9 resolved without intervention, likely secondary to hypoxia, doubt infection. -Addendum-no PE, shows large right pleural effusion increased in size from prior.  Likely etiology for his hypoxia.  Will order ultrasound-guided thoracentesis for a.m. with fluid analysis.  DM type 2 causing vascular disease (HCC) A1c 7.7.  Glucose 176. - SSI- S - .  Essential hypertension, benign Systolic 340B to 524E. -Resume doxazosin, Imdur, carvedilol -hold hydralazine '100mg'$  TID for now, I am not sure if he is  taking this and to avoid hypotension  ESRD on dialysis [facility]) Started dialysis within the past month.  Schedule Monday Wednesday Friday.  HD session terminated today due to sudden worsening of his dyspnea.  I talked with Dr. Arty Baumgartner today, okay with IV contrast for now. -Please consult nephrology in a.m. for HD   CHF (congestive heart failure) (HCC) Systolic and diastolic CHF.  At this time appears stable and compensated.  He reports he has not missed any HD sessions.  Last echo 07/2021, EF of 40 to 45%. -Per HD  Obstructive sleep apnea BIPAP for now while hospitalized.  S/P CABG (coronary artery bypass graft) Recent cardiac cath 8/22, showed patent grafts, severe three-vessel native CAD.Marland Kitchen  History of CABG 2020.  Troponin today 51 > 52. -Resume aspirin, carvedilol, Imdur,    DVT prophylaxis: Heparin Code Status: Full Family Communication: None at bedside Disposition Plan: ~ 2 days Consults called: Pls reconsult nephrology in the morning. Admission status: Obs stepdown  Author: Bethena Roys, MD 02/28/2022 10:12 PM  For on call review [email or web address].

## 2022-02-28 NOTE — ED Notes (Signed)
ED Provider at bedside. 

## 2022-02-28 NOTE — ED Notes (Signed)
Pt in bed with eyes closed, resps even and unlabored.  

## 2022-02-28 NOTE — ED Notes (Signed)
Date and time results received: 02/28/22 1325 (use smartphrase ".now" to insert current time)  Test: VBG Critical Value: PO2 is less than 31  Name of Provider Notified: Nechama Guard

## 2022-02-28 NOTE — Assessment & Plan Note (Addendum)
Difficulty breathing over the past 2 days PTA -placed on BiPAP due to increased work of breathing.   -Respiratory status has improved after thora (1.3L removed).  Quit smoking cigarettes about 33 years ago. . -Started on BiPAP and nitroglycerin drip which was discontinued after improvement in respiratory status. -Hold off on further antibiotics at this time -Obtain CTA chest, I talked to nephrologist Dr. [name], okay to administer IV contrast at this time -Lactic acid 3.5 > 1.9 resolved without intervention, likely secondary to hypoxia, doubt infection. -Addendum-no PE, shows large right pleural effusion increased in size from prior.  Likely etiology for his hypoxia.  -ordered ultrasound-guided thoracentesis>>1.3L -now stable on RA

## 2022-02-28 NOTE — Assessment & Plan Note (Addendum)
Recent cardiac cath 8/22, showed patent grafts, severe three-vessel native CAD.Marland Kitchen  History of CABG 2020.  Troponin today 51 > 52, not consistent with ACS -Resume aspirin, metoprolol, Isordil   -No chest pain -03/01/2022 echo EF 40-45%, global HK, mild TR, RV overload

## 2022-02-28 NOTE — ED Provider Notes (Signed)
[facility] EMERGENCY DEPARTMENT Provider Note   CSN: [id number] Arrival date & time: 02/28/22  1227     History  Chief Complaint  Patient presents with   Shortness of Breath    JONAH GINGRAS is a 66 y.o. male. 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  Patient said his last full session of dialysis was this past Friday.  He does note making small amounts of urine.  He only finished half the session today but was discontinued due to increased shortness of breath with cough productive of clear frothy sputum.  He denies any leg pain or swelling.  Does endorse increased substernal chest tightness that is nonradiating, no radiating to the neck, arm or back.  He has had no fevers, congestion, rhinorrhea or productive cough. However, does note increased cough from baseline.  He has no history of PE or DVT and is not on anticoagulation.   Shortness of Breath      Home Medications Prior to Admission medications   Medication Sig Start Date End Date Taking? Authorizing Provider  acetaminophen (TYLENOL) 325 MG tablet Take 2 tablets (650 mg total) by mouth every 6 (six) hours as needed for mild pain, moderate pain or fever. 08/02/21   Allie Bossier, MD  albuterol (VENTOLIN HFA) 108 (90 Base) MCG/ACT inhaler Inhale 2 puffs into the lungs every 6 (six) hours as needed. Patient taking differently: Inhale 2 puffs into the lungs every 6 (six) hours as needed for wheezing or shortness of breath. 06/24/21   Coral Spikes, DO  ARIPiprazole (ABILIFY) 5 MG tablet Take 5 mg by mouth daily.    [provider]  ascorbic acid (VITAMIN C) 500 MG tablet Take 1 tablet (500 mg total) by mouth daily. 08/03/21   Allie Bossier, MD  calcium acetate (PHOSLO) 667 MG capsule Take 667 mg by mouth in the morning, at noon, and at bedtime.    [provider]  calcium-vitamin D (OSCAL WITH D) 500-5 MG-MCG tablet Take 1 tablet by mouth 2 (two) times daily.    [provider]  cholecalciferol (VITAMIN D) 25 MCG (1000 UNIT) tablet Take 1,000 Units by mouth daily.    [provider]  doxazosin (CARDURA) 4 MG tablet Take 1 tablet (4 mg total) by mouth daily. 09/14/21   Loel Dubonnet, NP  EPINEPHrine 0.3 mg/0.3 mL IJ SOAJ injection Inject 0.3 mg into the muscle as needed for anaphylaxis. 01/25/21   [name], MD  hydrALAZINE (APRESOLINE) 100 MG tablet Take 1 tablet (100 mg total) by mouth 3 (three) times daily. Patient taking differently: Take 100 mg by mouth See admin instructions. Take 3 times a day on non-dialysis days for HTN. (Tue, Thu, Sat, Sun) 11/01/21   Loel Dubonnet, NP  hydrOXYzine (VISTARIL) 25 MG capsule TAKE ONE CAPSULE BY MOUTH NIGHTLY AS NEEDED FOR ALLERGIES AND INSOMNIA Patient taking differently: Take 25 mg by mouth at bedtime as needed (allerigies and insomnia). 11/01/21   [name], MD  insulin aspart (NOVOLOG) 100 UNIT/ML injection Inject 0-15 Units into the skin 3 (three) times daily before meals. Per sliding scale    [provider]  Insulin Pen Needle (B-D ULTRAFINE III SHORT PEN) 31G X 8 MM MISC  1 each by Does not apply route as directed. 05/30/19   Cassandria Anger, MD  isosorbide mononitrate (IMDUR) 30 MG 24 hr tablet Take 1 tablet (30 mg total) by mouth daily. 02/04/22   Regalado, Belkys A, MD  lidocaine (ASPERCREME LIDOCAINE) 4 % Place 1 patch onto the skin daily. Wear for 12 hours then remove, wait 12 hours before applying a new patch to lower back    [provider]  metoprolol tartrate (LOPRESSOR) 50 MG tablet Take 1 tablet (50 mg total) by mouth 2 (two) times daily. 02/03/22   Regalado, Belkys A, MD  Multiple Vitamins-Minerals (THERATRUM  COMPLETE) TABS Take 1 tablet by mouth daily.    [provider]  nitroGLYCERIN (NITROSTAT) 0.4 MG SL tablet Place 1 tablet (0.4 mg total) under the tongue every 5 (five) minutes as needed for chest pain. 09/14/21   Loel Dubonnet, NP  Nutritional Supplements (FEEDING SUPPLEMENT, NEPRO CARB STEADY,) LIQD Take 237 mLs by mouth 2 (two) times daily with a meal.    [provider]  omeprazole (PRILOSEC) 40 MG capsule Take 40 mg by mouth in the morning and at bedtime.    [provider]  ondansetron (ZOFRAN) 4 MG tablet Take 4 mg by mouth every 8 (eight) hours as needed for nausea or vomiting.    [provider]  REPATHA SURECLICK 841 MG/ML SOAJ Inject 140 mg as directed every 14 (fourteen) days. Patient taking differently: Inject 140 mg as directed every 14 (fourteen) days. Starting 02/08/22 09/14/21   Loel Dubonnet, NP  sertraline (ZOLOFT) 100 MG tablet 1 daily 02/11/22   [name], MD  traZODone (DESYREL) 50 MG tablet Take 0.5-1 tablets (25-50 mg total) by mouth at bedtime as needed for sleep. 02/11/22   [name], MD      Allergies    Bee venom, Atorvastatin, Diltiazem, Reglan [metoclopramide], Rosuvastatin, and Valsartan    Review of Systems   Review of Systems  Respiratory:  Positive for shortness of breath.     Physical Exam Updated Vital Signs BP 133/68   Pulse 76   Temp 98.9 F (37.2 C) (Axillary)   Resp (!) 22   Ht '5\' 10"'$  (1.778 m)   Wt 83 kg   SpO2 97%   BMI 26.26 kg/m  Physical Exam Constitutional: Alert and oriented.  Moderate respiratory distress sitting up in bed tachypneic with frothy white clear sputum at mouth Eyes: Conjunctivae are normal. ENT      Head: Normocephalic and atraumatic.      Nose: No congestion.      Mouth/Throat: Thick clear white sputum at mouth      Neck: No stridor. Cardiovascular: S1, S2, equal palpable radial pulses, warm and dry  respiratory: Tachypneic, diffuse crackles mixed with expiratory  wheezing Gastrointestinal: Soft and nontender. Musculoskeletal: Normal range of motion in all extremities. No peripheral edema Neurologic: Normal speech and language. No gross focal neurologic deficits are appreciated. Skin: Skin is warm, dry and intact. No rash noted. Psychiatric: Mood and affect are normal. Speech and behavior are normal.  ED Results / Procedures / Treatments   Labs (all labs ordered are listed, but only abnormal results are displayed) Labs Reviewed  COMPREHENSIVE METABOLIC PANEL - Abnormal; Notable for the following components:      Result Value   CO2 20 (*)    Glucose, Bld 176 (*)    BUN 48 (*)    Creatinine, Ser 4.47 (*)    Calcium 8.7 (*)  Alkaline Phosphatase 135 (*)    GFR, Estimated 14 (*)    Anion gap 19 (*)    All other components within normal limits  LACTIC ACID, PLASMA - Abnormal; Notable for the following components:   Lactic Acid, Venous 3.5 (*)    All other components within normal limits  CBC WITH DIFFERENTIAL/PLATELET - Abnormal; Notable for the following components:   RBC 4.00 (*)    Hemoglobin 10.7 (*)    HCT 35.0 (*)    RDW 17.1 (*)    Neutro Abs 8.2 (*)    Lymphs Abs 0.4 (*)    All other components within normal limits  PROTIME-INR - Abnormal; Notable for the following components:   Prothrombin Time 16.6 (*)    INR 1.4 (*)    All other components within normal limits  URINALYSIS, ROUTINE W REFLEX MICROSCOPIC - Abnormal; Notable for the following components:   Glucose, UA >=500 (*)    Protein, ur >=300 (*)    All other components within normal limits  BLOOD GAS, VENOUS - Abnormal; Notable for the following components:   pCO2, Ven 43 (*)    pO2, Ven <31 (*)    Acid-base deficit 2.5 (*)    All other components within normal limits  TROPONIN I (HIGH SENSITIVITY) - Abnormal; Notable for the following components:   Troponin I (High Sensitivity) 51 (*)    All other components within normal limits  CULTURE, BLOOD (ROUTINE X 2)   CULTURE, BLOOD (ROUTINE X 2)  LACTIC ACID, PLASMA  BRAIN NATRIURETIC PEPTIDE  TROPONIN I (HIGH SENSITIVITY)    EKG EKG Interpretation  Date/Time:  Monday February 28 2022 12:34:34 EDT Ventricular Rate:  104 PR Interval:    QRS Duration: 124 QT Interval:  368 QTC Calculation: 484 R Axis:   122 Text Interpretation: Atrial flutter with predominant 2:1 AV block Nonspecific intraventricular conduction delay Probable inferior infarct, age indeterminate Sinus tachycardia T wave inversions similar to previous in the inferior leads Confirmed by Georgina Snell [phone]) on 02/28/2022 12:51:26 PM  Radiology DG Chest Port 1 View  Result Date: 02/28/2022 CLINICAL DATA:  Chest pain and dyspnea EXAM: PORTABLE CHEST 1 VIEW COMPARISON:  01/29/2022 chest radiograph. FINDINGS: Intact sternotomy wires. Pacer pad overlies the left chest. Right internal jugular central venous catheter terminates over the cavoatrial junction. Stable cardiomediastinal silhouette with mild cardiomegaly. No pneumothorax. Small right pleural effusion is slightly increased. No left pleural effusion. Slightly low lung volumes, similar. Cephalization of the pulmonary vasculature without overt pulmonary edema. Mild hazy right lung base opacity is mildly increased. IMPRESSION: 1. Stable mild cardiomegaly without overt pulmonary edema. 2. Small right pleural effusion, slightly increased. 3. Mildly increased hazy right lung base opacity, favor atelectasis. Electronically Signed   By: Ilona Sorrel M.D.   On: 02/28/2022 13:40    Procedures .Critical Care  Performed by: Elgie Congo, MD Authorized by: Elgie Congo, MD   Critical care provider statement:    Critical care time (minutes):  35   Critical care was necessary to treat or prevent imminent or life-threatening deterioration of the following conditions:  Respiratory failure   Critical care was time spent personally by me on the following activities:  Development of  treatment plan with patient or surrogate, discussions with consultants, evaluation of patient's response to treatment, examination of patient, ordering and review of laboratory studies, ordering and review of radiographic studies, ordering and performing treatments and interventions, pulse oximetry, re-evaluation of patient's condition, review of old charts and  obtaining history from patient or surrogate   Care discussed with: admitting provider       Medications Ordered in ED Medications  nitroGLYCERIN 50 mg in dextrose 5 % 250 mL (0.2 mg/mL) infusion (0 mcg/min Intravenous Stopped 02/28/22 1607)  ipratropium-albuterol (DUONEB) 0.5-2.5 (3) MG/3ML nebulizer solution 6 mL (6 mLs Nebulization Not Given 02/28/22 1348)  cefTRIAXone (ROCEPHIN) 2 g in sodium chloride 0.9 % 100 mL IVPB (0 g Intravenous Stopped 02/28/22 1608)  azithromycin (ZITHROMAX) 500 mg in sodium chloride 0.9 % 250 mL IVPB (500 mg Intravenous New Bag/Given 02/28/22 1527)  aspirin chewable tablet 324 mg (324 mg Oral Given 02/28/22 1302)  albuterol (PROVENTIL) (2.5 MG/3ML) 0.083% nebulizer solution (  Given 02/28/22 1300)    ED Course/ Medical Decision Making/ A&P Clinical Course as of 02/28/22 1621  Mon Feb 28, 2022  1409 Interpreted patient's chest x-ray personally and agree with radiologist interpretation of increased right-sided pleural effusion. [VB]  1619 Repeat troponin is pending.  Repeat lactate down trended to 1.9 with BiPAP and Rocephin and azithromycin.  He has been taken off nitroglycerin drip due to improvement in symptoms and discussed case with hospitalist will be down to evaluate the patient and put in orders for admission for continued treatment of suspected possible pneumonia as well as episode of hypoxic respiratory failure likely secondary to suspected flash pulmonary edema. [VB]    Clinical Course User Index [VB] Elgie Congo, MD                           Medical Decision Making ASMAR BROZEK is a 67  y.o. male. 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   Patient presented hypertensive with SBP in the 170s, tachypneic to the 30s with borderline tachycardia and decreased air movement with scattered wheezes.  No peripheral edema.  Frothy clear sputum at mouth.  Based on patient's presentation with increased work of breathing, hypertension and frothy sputum at the mouth in the setting of incomplete dialysis session, his presentation seems more consistent with pulmonary edema and fluid overload despite no pitting edema of the lower extremities.  He was started on BiPAP and a nitroglycerin drip with significant improvement in his symptoms.  He is afebrile with a normal white blood cell count 8.8 but an elevated lactate 3.5 which is likely from hypoxic episode but will cover with Rocephin and azithromycin due to increased cough per patient as well as chest x-ray which showed increased right-sided pleural effusion as well as right hazy lung opacity in the lower lobe.  Suspect wheezing was more likely secondary to underlying known heart failure but received DuoNebs as well.  I do not think PE with no signs of DVT on exam, no hemoptysis, no tachycardia and improvement with BiPAP and wheezing on exam.  Of note, he did note substernal chest tightness and pain which improved after starting BiPAP.  His EKG had nonspecific T wave changes similar to previous and had a recent catheterization during last hospitalization that showed no obstruction of known CABG.  His troponin is elevated 51 by suspect was demand ischemia in the setting of hypoxia and respiratory distress. Will continue to trend.  Nitroglycerin drip was discontinued after  initial improvement  with BiPAP and nitroglycerin.  He is receiving Rocephin and azithromycin for possible presumed pneumonia and remains on BiPAP.  Will page hospitalist for admission.    Amount and/or Complexity of Data Reviewed Labs: ordered. Decision-making details documented in ED Course.    Details: Potassium 4.4 within normal limits, creatinine 4.47 in the setting of known ESRD. Radiology: ordered.  Risk OTC drugs. Prescription drug management. Decision regarding hospitalization.    Final Clinical Impression(s) / ED Diagnoses Final diagnoses:  Shortness of breath  Acute respiratory failure with hypoxia (HCC)  Pleural effusion    Rx / DC Orders ED Discharge Orders     None         Elgie Congo, MD 02/28/22 1621

## 2022-02-28 NOTE — Assessment & Plan Note (Addendum)
Systolic 120s to 409W. -Resume doxazosin, Imdur, metoprolol  -hydralazine restarted by cardiology team 9/19 -metoprolol changed to succinate 100 mg daily

## 2022-02-28 NOTE — ED Notes (Signed)
Pt denies chest pain at this time.

## 2022-02-28 NOTE — Assessment & Plan Note (Addendum)
A1c 7.7.  Glucose 176. - continue SSI -CBGs largely controlled

## 2022-02-28 NOTE — ED Triage Notes (Signed)
Pt brought in by [facility] from dialysis center with c/o SOB x 2 days, but suddenly worsened during dialysis today. Pt recevied 1 hr and 54 minutes (1.8L removed) of dialysis today. EMS reports pt has been coughing up Shanen Norris colored sputum for the past few days. Audible wheezing on arrival. Pt given Albuterol 2.'5mg'$  neb by EMS.

## 2022-02-28 NOTE — Progress Notes (Signed)
Patient taken off BIPAP and placed on 3L [place].  Patient tolerating at this time.  O2 sats 97%.  Patient transported from ER to ICU on nasal cannula.  BIPAP setup at bedside,

## 2022-02-28 NOTE — Progress Notes (Signed)
RT took pt off bipap and placed him on a 4L [place]. O2 saturations were 96-97. RT accompanied RN and pt to CT to monitor pt. Patient begin to have increased WOB but said he felt fine. RT placed pt back on bipap due to increased WOB upon arrival to ED room.

## 2022-02-28 NOTE — Assessment & Plan Note (Signed)
BIPAP for now while hospitalized.

## 2022-02-28 NOTE — ED Notes (Signed)
RT called to take pt to CT scan

## 2022-02-28 NOTE — ED Notes (Signed)
Pt in bed, pt has bipap in place, pt denies pain at this time.

## 2022-02-28 NOTE — Assessment & Plan Note (Addendum)
Started dialysis within the past month.  Schedule Monday Wednesday Friday.  HD session terminated today due to sudden worsening of his dyspnea.  I talked with Dr. Colodonato>>, okay with IV contrast for now. -nephrology consulted to manage inpatient HD -03/02/22 UF 1.8L -HD 03/04/22

## 2022-02-28 NOTE — ED Notes (Signed)
Pt to and from ct with RN and RT, pt denies pain.

## 2022-03-01 ENCOUNTER — Inpatient Hospital Stay (HOSPITAL_COMMUNITY): Payer: Medicare HMO

## 2022-03-01 ENCOUNTER — Encounter (HOSPITAL_COMMUNITY): Payer: Self-pay | Admitting: Internal Medicine

## 2022-03-01 ENCOUNTER — Ambulatory Visit: Payer: Medicare HMO | Admitting: Family Medicine

## 2022-03-01 DIAGNOSIS — I5022 Chronic systolic (congestive) heart failure: Secondary | ICD-10-CM | POA: Diagnosis not present

## 2022-03-01 DIAGNOSIS — I471 Supraventricular tachycardia, unspecified: Secondary | ICD-10-CM | POA: Diagnosis not present

## 2022-03-01 DIAGNOSIS — J90 Pleural effusion, not elsewhere classified: Secondary | ICD-10-CM | POA: Diagnosis present

## 2022-03-01 DIAGNOSIS — R7881 Bacteremia: Secondary | ICD-10-CM | POA: Diagnosis present

## 2022-03-01 DIAGNOSIS — G4733 Obstructive sleep apnea (adult) (pediatric): Secondary | ICD-10-CM

## 2022-03-01 DIAGNOSIS — R0609 Other forms of dyspnea: Secondary | ICD-10-CM

## 2022-03-01 DIAGNOSIS — E785 Hyperlipidemia, unspecified: Secondary | ICD-10-CM | POA: Diagnosis not present

## 2022-03-01 DIAGNOSIS — Z66 Do not resuscitate: Secondary | ICD-10-CM | POA: Diagnosis present

## 2022-03-01 DIAGNOSIS — N186 End stage renal disease: Secondary | ICD-10-CM | POA: Diagnosis not present

## 2022-03-01 DIAGNOSIS — J9601 Acute respiratory failure with hypoxia: Secondary | ICD-10-CM | POA: Diagnosis not present

## 2022-03-01 DIAGNOSIS — E1159 Type 2 diabetes mellitus with other circulatory complications: Secondary | ICD-10-CM | POA: Diagnosis not present

## 2022-03-01 DIAGNOSIS — I5043 Acute on chronic combined systolic (congestive) and diastolic (congestive) heart failure: Secondary | ICD-10-CM | POA: Diagnosis not present

## 2022-03-01 HISTORY — DX: Bacteremia: R78.81

## 2022-03-01 LAB — GRAM STAIN

## 2022-03-01 LAB — BASIC METABOLIC PANEL
Anion gap: 14 (ref 5–15)
Anion gap: 12 (ref 5–15)
Anion gap: 12 (ref 5–15)
BUN: 56 mg/dL — ABNORMAL HIGH (ref 8–23)
BUN: 59 mg/dL — ABNORMAL HIGH (ref 8–23)
BUN: 49 mg/dL — ABNORMAL HIGH (ref 8–23)
CO2: 21 mmol/L — ABNORMAL LOW (ref 22–32)
CO2: 23 mmol/L (ref 22–32)
CO2: 24 mmol/L (ref 22–32)
Calcium: 8.1 mg/dL — ABNORMAL LOW (ref 8.9–10.3)
Calcium: 8.3 mg/dL — ABNORMAL LOW (ref 8.9–10.3)
Calcium: 8 mg/dL — ABNORMAL LOW (ref 8.9–10.3)
Chloride: 100 mmol/L (ref 98–111)
Chloride: 102 mmol/L (ref 98–111)
Chloride: 98 mmol/L (ref 98–111)
Creatinine, Ser: 5.22 mg/dL — ABNORMAL HIGH (ref 0.61–1.24)
Creatinine, Ser: 5.38 mg/dL — ABNORMAL HIGH (ref 0.61–1.24)
Creatinine, Ser: 4.41 mg/dL — ABNORMAL HIGH (ref 0.61–1.24)
GFR, Estimated: 11 mL/min — ABNORMAL LOW (ref >60)
GFR, Estimated: 11 mL/min — ABNORMAL LOW (ref >60)
GFR, Estimated: 14 mL/min — ABNORMAL LOW (ref >60)
Glucose, Bld: 195 mg/dL — ABNORMAL HIGH (ref 70–99)
Glucose, Bld: 183 mg/dL — ABNORMAL HIGH (ref 70–99)
Glucose, Bld: 180 mg/dL — ABNORMAL HIGH (ref 70–99)
Potassium: 4 mmol/L (ref 3.5–5.1)
Potassium: 4.1 mmol/L (ref 3.5–5.1)
Potassium: 3.3 mmol/L — ABNORMAL LOW (ref 3.5–5.1)
Sodium: 135 mmol/L (ref 135–145)
Sodium: 137 mmol/L (ref 135–145)
Sodium: 134 mmol/L — ABNORMAL LOW (ref 135–145)

## 2022-03-01 LAB — BLOOD CULTURE ID PANEL (REFLEXED) - BCID2

## 2022-03-01 LAB — ECHOCARDIOGRAM COMPLETE
AR max vel: 2.66 cm2
AV Area VTI: 2.94 cm2
AV Area mean vel: 2.71 cm2
AV Mean grad: 3.5 mmHg
AV Peak grad: 7.2 mmHg
Ao pk vel: 1.34 m/s
Area-P 1/2: 3.05 cm2
Calc EF: 36 %
Height: 70 in
MV VTI: 2.56 cm2
S' Lateral: 4.3 cm
Single Plane A2C EF: 27.8 %
Single Plane A4C EF: 50.6 %
Weight: 2903.02 oz

## 2022-03-01 LAB — CBC
HCT: 29.2 % — ABNORMAL LOW (ref 39.0–52.0)
Hemoglobin: 9.2 g/dL — ABNORMAL LOW (ref 13.0–17.0)
MCH: 27 pg (ref 26.0–34.0)
MCHC: 31.5 g/dL (ref 30.0–36.0)
MCV: 85.6 fL (ref 80.0–100.0)
Platelets: 157 10*3/uL (ref 150–400)
RBC: 3.41 MIL/uL — ABNORMAL LOW (ref 4.22–5.81)
RDW: 17.4 % — ABNORMAL HIGH (ref 11.5–15.5)
WBC: 7 10*3/uL (ref 4.0–10.5)
nRBC: 0 % (ref 0.0–0.2)

## 2022-03-01 LAB — GLUCOSE, CAPILLARY
Glucose-Capillary: 154 mg/dL — ABNORMAL HIGH (ref 70–99)
Glucose-Capillary: 164 mg/dL — ABNORMAL HIGH (ref 70–99)
Glucose-Capillary: 171 mg/dL — ABNORMAL HIGH (ref 70–99)
Glucose-Capillary: 175 mg/dL — ABNORMAL HIGH (ref 70–99)
Glucose-Capillary: 210 mg/dL — ABNORMAL HIGH (ref 70–99)
Glucose-Capillary: 243 mg/dL — ABNORMAL HIGH (ref 70–99)

## 2022-03-01 LAB — HEPATITIS B SURFACE ANTIBODY,QUALITATIVE: Hep B S Ab: NONREACTIVE

## 2022-03-01 LAB — MRSA NEXT GEN BY PCR, NASAL: MRSA by PCR Next Gen: NOT DETECTED

## 2022-03-01 LAB — HEPATITIS B CORE ANTIBODY, TOTAL: Hep B Core Total Ab: NONREACTIVE

## 2022-03-01 LAB — LACTATE DEHYDROGENASE: LDH: 111 U/L (ref 98–192)

## 2022-03-01 LAB — GLUCOSE, PLEURAL OR PERITONEAL FLUID: Glucose, Fluid: 197 mg/dL

## 2022-03-01 LAB — MAGNESIUM
Magnesium: 2.1 mg/dL (ref 1.7–2.4)
Magnesium: 1.8 mg/dL (ref 1.7–2.4)

## 2022-03-01 LAB — LACTATE DEHYDROGENASE, PLEURAL OR PERITONEAL FLUID: LD, Fluid: 85 U/L — ABNORMAL HIGH (ref 3–23)

## 2022-03-01 LAB — TROPONIN I (HIGH SENSITIVITY): Troponin I (High Sensitivity): 53 ng/L — ABNORMAL HIGH (ref <18)

## 2022-03-01 LAB — HEPATITIS C ANTIBODY: HCV Ab: NONREACTIVE

## 2022-03-01 LAB — PHOSPHORUS: Phosphorus: 6.1 mg/dL — ABNORMAL HIGH (ref 2.5–4.6)

## 2022-03-01 LAB — HEPATITIS B SURFACE ANTIGEN: Hepatitis B Surface Ag: NONREACTIVE

## 2022-03-01 MED ORDER — AMIODARONE HCL IN DEXTROSE 360-4.14 MG/200ML-% IV SOLN
60.0000 mg/h | INTRAVENOUS | Status: AC
  Administered 2022-03-01: 60 mg/h via INTRAVENOUS
  Filled 2022-03-01: qty 200

## 2022-03-01 MED ORDER — SODIUM CHLORIDE 0.9 % IV SOLN
2.0000 g | INTRAVENOUS | Status: DC
  Administered 2022-03-02: 2 g via INTRAVENOUS
  Filled 2022-03-01 (×2): qty 12.5

## 2022-03-01 MED ORDER — CHLORHEXIDINE GLUCONATE CLOTH 2 % EX PADS
6.0000 | MEDICATED_PAD | Freq: Every day | CUTANEOUS | Status: DC
  Administered 2022-03-01: 6 via TOPICAL

## 2022-03-01 MED ORDER — METOPROLOL TARTRATE 50 MG PO TABS
75.0000 mg | ORAL_TABLET | Freq: Two times a day (BID) | ORAL | Status: AC
  Administered 2022-03-01 – 2022-03-08 (×12): 75 mg via ORAL
  Filled 2022-03-01 (×14): qty 1

## 2022-03-01 MED ORDER — DILTIAZEM HCL 25 MG/5ML IV SOLN
5.0000 mg | Freq: Once | INTRAVENOUS | Status: AC
  Administered 2022-03-01: 5 mg via INTRAVENOUS
  Filled 2022-03-01: qty 5

## 2022-03-01 MED ORDER — LIDOCAINE HCL (PF) 2 % IJ SOLN
INTRAMUSCULAR | Status: AC
  Administered 2022-03-01: 10 mL
  Filled 2022-03-01: qty 10

## 2022-03-01 MED ORDER — AMIODARONE HCL IN DEXTROSE 360-4.14 MG/200ML-% IV SOLN
30.0000 mg/h | INTRAVENOUS | Status: DC
  Administered 2022-03-02 – 2022-03-05 (×6): 30 mg/h via INTRAVENOUS
  Filled 2022-03-01 (×8): qty 200

## 2022-03-01 MED ORDER — ALBUMIN HUMAN 25 % IV SOLN
25.0000 g | Freq: Once | INTRAVENOUS | Status: AC
  Administered 2022-03-01: 25 g via INTRAVENOUS

## 2022-03-01 MED ORDER — ASPIRIN 81 MG PO TBEC
324.0000 mg | DELAYED_RELEASE_TABLET | Freq: Every day | ORAL | Status: DC
  Administered 2022-03-02 – 2022-03-05 (×4): 324 mg via ORAL
  Filled 2022-03-01 (×5): qty 4

## 2022-03-01 MED ORDER — AMIODARONE LOAD VIA INFUSION
150.0000 mg | Freq: Once | INTRAVENOUS | Status: AC
  Filled 2022-03-01: qty 83.34

## 2022-03-01 MED ORDER — METOPROLOL TARTRATE 25 MG PO TABS
25.0000 mg | ORAL_TABLET | Freq: Once | ORAL | Status: AC
  Administered 2022-03-01: 25 mg via ORAL
  Filled 2022-03-01: qty 1

## 2022-03-01 MED ORDER — AMIODARONE IV BOLUS ONLY 150 MG/100ML
INTRAVENOUS | Status: AC
  Filled 2022-03-01: qty 100

## 2022-03-01 MED ORDER — HEPARIN SODIUM (PORCINE) 1000 UNIT/ML IJ SOLN
3800.0000 [IU] | Freq: Once | INTRAMUSCULAR | Status: AC
  Administered 2022-03-01: 3800 [IU]

## 2022-03-01 MED ORDER — CHLORHEXIDINE GLUCONATE CLOTH 2 % EX PADS
6.0000 | MEDICATED_PAD | Freq: Every day | CUTANEOUS | Status: DC
  Administered 2022-02-28 – 2022-03-06 (×6): 6 via TOPICAL

## 2022-03-01 MED ORDER — METOPROLOL TARTRATE 50 MG PO TABS
50.0000 mg | ORAL_TABLET | Freq: Two times a day (BID) | ORAL | Status: DC
  Administered 2022-03-01: 50 mg via ORAL
  Filled 2022-03-01: qty 1

## 2022-03-01 MED ORDER — HYDRALAZINE HCL 25 MG PO TABS
25.0000 mg | ORAL_TABLET | Freq: Three times a day (TID) | ORAL | Status: DC
  Administered 2022-03-02 – 2022-03-04 (×5): 25 mg via ORAL
  Filled 2022-03-01 (×10): qty 1

## 2022-03-01 MED ORDER — AMIODARONE HCL IN DEXTROSE 360-4.14 MG/200ML-% IV SOLN
INTRAVENOUS | Status: AC
  Administered 2022-03-01: 150 mg via INTRAVENOUS
  Filled 2022-03-01: qty 200

## 2022-03-01 MED ORDER — NEPRO/CARBSTEADY PO LIQD
237.0000 mL | Freq: Two times a day (BID) | ORAL | Status: DC
  Administered 2022-03-02 – 2022-03-08 (×7): 237 mL via ORAL

## 2022-03-01 MED ORDER — SODIUM CHLORIDE 0.9 % IV SOLN
2.0000 g | Freq: Once | INTRAVENOUS | Status: AC
  Administered 2022-03-01: 2 g via INTRAVENOUS
  Filled 2022-03-01: qty 12.5

## 2022-03-01 NOTE — Addendum Note (Signed)
Encounter addended by: Markus Daft A on: 03/01/2022 12:58 PM  Actions taken: Imaging Exam ended

## 2022-03-01 NOTE — Progress Notes (Signed)
PHARMACY - PHYSICIAN COMMUNICATION CRITICAL VALUE ALERT - BLOOD CULTURE IDENTIFICATION (BCID)  [name] is an 66 y.o. 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 Wynetta Emery  Current antibiotics: Cefepime   Changes to prescribed antibiotics recommended:  Patient is on recommended antibiotics - No changes needed  No results found for this or any previous visit.  [name] 03/01/2022  5:06 PM

## 2022-03-01 NOTE — Progress Notes (Addendum)
PROGRESS NOTE   [name]  IRS:[id number] DOB: 1955/06/29 DOA: 02/28/2022 PCP: [name], MD   Chief Complaint  Patient presents with   Shortness of Breath   Level of care: Stepdown  Brief Admission History:  66 y.o. male with medical history significant for CABG, ESRD, cardiomyopathy with systolic and diastolic CHF, stroke, hypertension, diabetes mellitus. Patient was brought to the ED from dialysis center with reports of worsening of his difficulty breathing.  At the time of my evaluation, patient is on BiPAP. Patient was having dialysis today and was 1 and 54 minutes into session when it had to be stopped.  He was having significant worsening of his breathing, with use of accessory muscle, was placed on 4 L nasal cannula, and BiPAP here. Patient reports has been having difficulty breathing over the past 2 days.  He reported cough productive of whitish phlegm.  On arrival to the ED there was some wheezing.   Recent hospitalization 8/19 to 8/25 for chest pain, transferred to [facility], due to history of previous CABG.  Underwent cardiac cath 8/22 which showed patent CABG.   ED Course: Temperature 98.9.  Initial heart rate 103 improved to 70s, respiratory rate initially up to 30 improved after BiPAP, blood pressure 120s to 170s.  O2 sats 90% on 4 L nasal cannula, subsequently switched to BiPAP due to tachycardia, tachypnea, accessory muscle use and increased work of breathing. Chest x-ray with mild cardiomegaly without overt pulmonary edema, small right pleural effusion slightly increased, hazy right lung base opacity favor atelectasis. VBG with pH of 7.3, PCO2 of 43.  Lactic acid 3.5.  Troponin 51.   IV ceftriaxone and azithromycin started. Initial concern for flash pulmonary edema, hence nitroglycerin drip initially started and later discontinued with improvement in respiratory status.   Assessment and Plan: * Acute respiratory failure with hypoxia (HCC) Difficulty  breathing over the past 2 days, significantly worse today during HD.  O2 sats 90% on 4 L, placed on BiPAP due to increased work of breathing.  Respiratory status has improved.  He is calm.  Reported initial wheezing, but on my exam lungs are clear.  No documented history of COPD.  Quit smoking cigarettes about 33 years ago.  EDP was concerned about flash pulmonary edema, so nitro drip was started.  Chest x-ray at this time-shows stable Mild cardiomegaly without overt pulmonary edema, small right pleural effusion slightly increased, right lung base opacity favors atelectasis. -Started on BiPAP and nitroglycerin drip which was discontinued after improvement in respiratory status. -Hold off on further antibiotics at this time -Obtain CTA chest, I talked to nephrologist Dr. [name], okay to administer IV contrast at this time -Lactic acid 3.5 > 1.9 resolved without intervention, likely secondary to hypoxia, doubt infection. -Addendum-no PE, shows large right pleural effusion increased in size from prior.  Likely etiology for his hypoxia.  Will order ultrasound-guided thoracentesis for a.m. with fluid analysis.  Pleural effusion on right -- likely major contributor to his dyspneic symptoms -- we have requested US thoracentesis by radiology on 9/19  SVT (supraventricular tachycardia) ([place]) --Pt found unresponsive in sustained SVT on 9/18, resolved after vagal maneuvers -- will ask for cardiology evaluation, TTE has been ordered  DNR (do not resuscitate) --continue DNR order while in hospital   gram neg rod Bacteremia --starting cefepime per pharmacy, follow C&S findings   ESRD on dialysis [facility]) Started dialysis within the past month.  Schedule Monday Wednesday Friday.  HD session terminated today due to  sudden worsening of his dyspnea.  I talked with Dr. Arty Baumgartner today, okay with IV contrast for now. -nephrology consulted to manage inpatient HD -pt says he did not complete HD on   1/61   HFrEF Systolic and diastolic CHF.  At this time appears stable and compensated.  He reports he has not missed any HD sessions.  Last echo 07/2021, EF of 40 to 45%. -Per HD for fluid management, consulted Dr. [name] -cardiology team ordered updated TTE on 9/19  Obstructive sleep apnea BIPAP QHS while hospitalized.  DM type 2 causing vascular disease (HCC) A1c 7.7.  Glucose 176. - SSI- S CBG (last 3)  Recent Labs    02/28/22 2215 03/01/22 0024 03/01/22 0720  GLUCAP 171* 175* 210*     S/P CABG (coronary artery bypass graft) Recent cardiac cath 8/22, showed patent grafts, severe three-vessel native CAD.Marland Kitchen  History of CABG 2020.  Troponin today 51 > 52. -Resume aspirin, metoprolol, Imdur.  Follow up TTE pending.   Essential hypertension, benign Systolic 096E to 454U. -Resume doxazosin, Imdur, metoprolol  -hydralazine restarted by cardiology team 9/19   DVT prophylaxis: [place] heparin Code Status: Full  Family Communication:  Disposition: Status is: Inpatient Remains inpatient appropriate because: intensity    Consultants:  Nephrology Cardiology  Procedures:  US thoracentesis 03/01/22 Hemodialysis  Antimicrobials:    Subjective: He still feels short of breath.  He is agreeable to thoracentesis today.  He denies CP.   Objective: Vitals:   03/01/22 1020 03/01/22 1100 03/01/22 1112 03/01/22 1200  BP: (!) 136/59 (!) 131/55  122/70  Pulse: 71 71  70  Resp: 20 (!) 23  19  Temp:   98.4 F (36.9 C)   TempSrc:   Oral   SpO2: 99% 98%  97%  Weight:      Height:        Intake/Output Summary (Last 24 hours) at 03/01/2022 1301 Last data filed at 03/01/2022 0741 Gross per 24 hour  Intake 577.31 ml  Output 250 ml  Net 327.31 ml   Filed Weights   02/28/22 1234 02/28/22 2225  Weight: 83 kg 82.3 kg   Examination:  General exam: chronically ill appearing male, awake, alert, cooperative. Speaking short sentences.  Respiratory system: moderate increased work of  breathing, diminished BS RLL Cardiovascular system: normal S1 & S2 heard. No JVD, murmurs, rubs, gallops or clicks. No pedal edema. Gastrointestinal system: Abdomen is nondistended, soft and nontender. No organomegaly or masses felt. Normal bowel sounds heard. Central nervous system: Alert and oriented. No focal neurological deficits. Extremities: Symmetric 5 x 5 power. Skin: No rashes, lesions or ulcers. Psychiatry: Judgement and insight appear normal. Mood & affect appropriate.   Data Reviewed: I have personally reviewed following labs and imaging studies  CBC: Recent Labs  Lab 02/28/22 1238 03/01/22 0437  WBC 8.8 7.0  NEUTROABS 8.2*  --   HGB 10.7* 9.2*  HCT 35.0* 29.2*  MCV 87.5 85.6  PLT 198 981    Basic Metabolic Panel: Recent Labs  Lab 02/28/22 1238 03/01/22 0040 03/01/22 0437  NA 138 135 137  K 4.4 4.0 4.1  CL 99 100 102  CO2 20* 21* 23  GLUCOSE 176* 195* 183*  BUN 48* 56* 59*  CREATININE 4.47* 5.22* 5.38*  CALCIUM 8.7* 8.1* 8.3*  MG  --  2.1  --   PHOS  --  6.1*  --     CBG: Recent Labs  Lab 02/28/22 2215 03/01/22 0024 03/01/22 0720 03/01/22 1104  GLUCAP  171* 175* 210* 154*    Recent Results (from the past 240 hour(s))  Culture, blood (Routine x 2)     Status: None (Preliminary result)   Collection Time: 02/28/22 12:38 PM   Specimen: BLOOD RIGHT FOREARM  Result Value Ref Range Status   Specimen Description   Final    BLOOD RIGHT FOREARM BOTTLES DRAWN AEROBIC AND ANAEROBIC   Special Requests Blood Culture adequate volume  Final   Culture   Final    NO GROWTH < 24 HOURS Performed at [facility], [address] [place], [place] [address]    Report Status PENDING  Incomplete  Culture, blood (Routine x 2)     Status: None (Preliminary result)   Collection Time: 02/28/22 12:43 PM   Specimen: Right Antecubital; Blood  Result Value Ref Range Status   Specimen Description   Final    RIGHT ANTECUBITAL BOTTLES DRAWN AEROBIC AND ANAEROBIC   Special  Requests Blood Culture adequate volume  Final   Culture  Setup Time   Final    GRAM NEGATIVE RODS Gram Stain Report Called to,Read Back By and Verified With: L HILTON RN [id number] K FORSYTH   Culture   Final    NO GROWTH < 24 HOURS Performed at [facility], [address], [place], [place] [address]    Report Status PENDING  Incomplete  MRSA Next Gen by PCR, Nasal     Status: None   Collection Time: 02/28/22 10:20 PM   Specimen: Nasal Mucosa; Nasal Swab  Result Value Ref Range Status   MRSA by PCR Next Gen NOT DETECTED NOT DETECTED Final    Comment: (NOTE) The GeneXpert MRSA Assay (FDA approved for NASAL specimens only), is one component of a comprehensive MRSA colonization surveillance program. It is not intended to diagnose MRSA infection nor to guide or monitor treatment for MRSA infections. Test performance is not FDA approved in patients less than 63 years old. Performed at [facility], [address] [place], [place] [address]   Gram stain     Status: None   Collection Time: 03/01/22 10:20 AM   Specimen: Pleura  Result Value Ref Range Status   Specimen Description PLEURAL  Final   Special Requests PLEURAL  Final   Gram Stain   Final    NO ORGANISMS SEEN WBC PRESENT,BOTH PMN AND MONONUCLEAR CYTOSPIN SMEAR Performed at [facility], [address] [place], [place] [address]    Report Status 03/01/2022 FINAL  Final  Culture, body fluid w Gram Stain-bottle     Status: None (Preliminary result)   Collection Time: 03/01/22 10:20 AM   Specimen: Pleura  Result Value Ref Range Status   Specimen Description PLEURAL  Final   Special Requests   Final    BOTTLES DRAWN AEROBIC AND ANAEROBIC Blood Culture adequate volume Performed at [facility], [address], [place], [place] [address]    Culture PENDING  Incomplete   Report Status PENDING  Incomplete     Radiology Studies: DG Chest 1 View  Result Date: 03/01/2022 CLINICAL DATA:  Status post right thoracentesis. EXAM:  CHEST  1 VIEW COMPARISON:  CT chest and chest x-ray from yesterday. FINDINGS: Unchanged tunneled right internal jugular dialysis catheter. Unchanged mild cardiomegaly status post CABG. Decreased now trace small right pleural effusion. No pneumothorax. Improved aeration of the right lower lobe. No acute osseous abnormality. IMPRESSION: 1. Decreased right pleural effusion status post thoracentesis. No pneumothorax. Electronically Signed   By: Orville Govern.D.  On: 03/01/2022 11:26   US THORACENTESIS ASP PLEURAL SPACE W/IMG GUIDE  Result Date: 03/01/2022 INDICATION: Right pleural effusion. EXAM: ULTRASOUND GUIDED RIGHT THORACENTESIS MEDICATIONS: None. COMPLICATIONS: None immediate. PROCEDURE: An ultrasound guided thoracentesis was thoroughly discussed with the patient and questions answered. The benefits, risks, alternatives and complications were also discussed. The patient understands and wishes to proceed with the procedure. Written consent was obtained. Ultrasound was performed to localize and mark an adequate pocket of fluid in the right chest. The area was then prepped and draped in the normal sterile fashion. 1% Lidocaine was used for local anesthesia. Under ultrasound guidance a 6 Fr Safe-T-Centesis catheter was introduced. Thoracentesis was performed. The catheter was removed and a dressing applied. FINDINGS: A total of approximately 1.3 L of dark yellow fluid was removed. Samples were sent to the laboratory as requested by the clinical team. IMPRESSION: Successful ultrasound guided right thoracentesis yielding 1.3 L of pleural fluid. Electronically Signed   By: Titus Dubin M.D.   On: 03/01/2022 11:23   CT Angio Chest Pulmonary Embolism (PE) W or WO Contrast  Result Date: 02/28/2022 CLINICAL DATA:  Acute respiratory failure, cough, shortness of breath, hypoxia EXAM: CT ANGIOGRAPHY CHEST WITH CONTRAST TECHNIQUE: Multidetector CT imaging of the chest was performed using the standard protocol  during bolus administration of intravenous contrast. Multiplanar CT image reconstructions and MIPs were obtained to evaluate the vascular anatomy. RADIATION DOSE REDUCTION: This exam was performed according to the departmental dose-optimization program which includes automated exposure control, adjustment of the mA and/or kV according to patient size and/or use of iterative reconstruction technique. CONTRAST:  21m OMNIPAQUE IOHEXOL 350 MG/ML SOLN COMPARISON:  01/23/2022 FINDINGS: Cardiovascular: There is homogeneous enhancement in thoracic aorta. Coronary artery calcifications are seen. There is previous coronary artery bypass surgery. There is ectasia of main pulmonary artery measuring 4.1 cm suggesting pulmonary arterial hypertension. There are no intraluminal filling defects in central pulmonary artery branches. Evaluation of small peripheral pulmonary artery branches is limited by motion artifacts and atelectasis. Heart is enlarged in size. There is reflux of contrast into hepatic veins suggesting tricuspid incompetence. Tip of right IJ central venous catheter is seen in the region of right atrium. Mediastinum/Nodes: No new significant lymphadenopathy seen. Lungs/Pleura: There is large right pleural effusion with significant interval increase. Density measurements in the right pleural effusion are less than 20 Hounsfield units. There is minimal left pleural effusion. There is compression atelectasis in right lower lobe and to a lesser extent in right middle lobe and right upper lobe. No focal infiltrates are seen in left lung. There is no pneumothorax. Upper Abdomen: There is fatty infiltration in the liver. Spleen is enlarged measuring 15.4 cm in AP diameter. Multiple diverticula are seen in visualized portions of colon. Musculoskeletal: Deformity in the right tenth rib may suggest old ununited fracture. Review of the MIP images confirms the above findings. IMPRESSION: There is no evidence of central pulmonary  artery embolism. Evaluation of small peripheral pulmonary artery branches is limited by motion artifacts. There is ectasia of main pulmonary artery suggesting pulmonary arterial hypertension. Coronary artery disease. Previous coronary artery bypass surgery. Cardiomegaly. Tricuspid incompetence. There is large right pleural effusion with significant interval increase in size. Compression atelectasis is seen in right lung, especially in right lower lobe. There is minimal left pleural effusion. Fatty liver.  Enlarged spleen.  Diverticulosis of colon. Other findings as described in the body of the report. Electronically Signed   By: PElmer PickerM.D.   On: 02/28/2022  18:07   DG Chest Port 1 View  Result Date: 02/28/2022 CLINICAL DATA:  Chest pain and dyspnea EXAM: PORTABLE CHEST 1 VIEW COMPARISON:  01/29/2022 chest radiograph. FINDINGS: Intact sternotomy wires. Pacer pad overlies the left chest. Right internal jugular central venous catheter terminates over the cavoatrial junction. Stable cardiomediastinal silhouette with mild cardiomegaly. No pneumothorax. Small right pleural effusion is slightly increased. No left pleural effusion. Slightly low lung volumes, similar. Cephalization of the pulmonary vasculature without overt pulmonary edema. Mild hazy right lung base opacity is mildly increased. IMPRESSION: 1. Stable mild cardiomegaly without overt pulmonary edema. 2. Small right pleural effusion, slightly increased. 3. Mildly increased hazy right lung base opacity, favor atelectasis. Electronically Signed   By: Ilona Sorrel M.D.   On: 02/28/2022 13:40    Scheduled Meds:  [START ON 03/02/2022] aspirin EC  324 mg Oral Daily   calcium acetate  667 mg Oral TID WC   Chlorhexidine Gluconate Cloth  6 each Topical Q0600   Chlorhexidine Gluconate Cloth  6 each Topical Q0600   doxazosin  4 mg Oral Daily   feeding supplement (NEPRO CARB STEADY)  237 mL Oral BID BM   heparin  5,000 Units Subcutaneous Q8H    hydrALAZINE  25 mg Oral TID   insulin aspart  0-5 Units Subcutaneous QHS   insulin aspart  0-9 Units Subcutaneous TID WC   isosorbide dinitrate  30 mg Oral TID   metoprolol tartrate  75 mg Oral BID   sertraline  100 mg Oral Daily   Continuous Infusions:  ceFEPime (MAXIPIME) IV       LOS: 1 day   Critical Care Procedure Note Authorized and Performed by: Murvin Natal MD  Total Critical Care time:  40 mins  Due to a high probability of clinically significant, life threatening deterioration, the patient required my highest level of preparedness to intervene emergently and I personally spent this critical care time directly and personally managing the patient.  This critical care time included obtaining a history; examining the patient, pulse oximetry; ordering and review of studies; arranging urgent treatment with development of a management plan; evaluation of patient's response of treatment; frequent reassessment; and discussions with other providers.  This critical care time was performed to assess and manage the high probability of imminent and life threatening deterioration that could result in multi-organ failure.  It was exclusive of separately billable procedures and treating other patients and teaching time.    Irwin Brakeman, MD How to contact the [facility] Attending or Consulting provider [place] or covering provider during after hours [place], for this patient?  Check the care team in [facility] and look for a) attending/consulting [facility] provider listed and b) the [facility] team listed Log into [email or web address] and use [place]'s universal password to access. If you do not have the password, please contact the hospital operator. Locate the [facility] provider you are looking for under [facility] and page to a number that you can be directly reached. If you still have difficulty reaching the provider, please page the [facility] (Director on Call) for the Hospitalists listed on amion for assistance.  03/01/2022,  1:01 PM

## 2022-03-01 NOTE — Progress Notes (Addendum)
03/01/2022 7:09 PM  Pt going into abnormal rhythms again. Ordered an EKG. aflutter.  We gave him a small dose of IV diltiazem as patient reports itching from diltiazem.  If he goes into SVT will give adenosine.  I updated RN and the night covering physician.   Vitals:   03/01/22 1840 03/01/22 1845  BP: 118/78 (!) 154/80  Pulse: 100   Resp:    Temp:    SpO2:     Murvin Natal MD

## 2022-03-01 NOTE — Consult Note (Signed)
[facility] Renal Consultation Note    Indication for Consultation:  Management of ESRD/hemodialysis; anemia, hypertension/volume and secondary hyperparathyroidism  HPI: ZUBAIR LOFTON is a 66 y.o. 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  HD was stopped almost half way through HD and EMS called to transport to [facility] ED.  He was placed on 4L [place] due to increased WOB and eventually placed on BiPAP in ED.  VSS, SpO2 90% on 4 liters.  CXR with mild cardiomegaly, right pleural effusions.  CT angio without evidence of PE, large right pleural effusion with atelectasis of right lung.  He was given IV antibiotics and admitted to ICU with flash pulmonary edema.  Labs notable for BNP >4500, lactate 3.5, K 4.4, BUN 48, WBC 8.8, Hgb 10.7, plt 198.  He also developed SVT and Cardiology was consulted.  We were consulted to provide HD during his hospitalization.  Of note, he has had 2, consecutive shortened HD treatments.  On 02/25/22 he came late to treatment due to transportation issues, and on 02/28/22 s/o early due to worsening SOB and chest pain.  He left 2.7 kg above edw.  While examining the patient, he started coughing and his HR rose into the 170's.  After blowing through a straw, his HR improved into the 70's.    Past Medical History:  Diagnosis Date   Anemia    Arthritis    hips shoulders   CAD (coronary artery disease) 03/03/2019   Cancer ([place])    Esophageal cancer 2022   CHF (congestive heart failure) ([place]) 2020   Chronic combined systolic and diastolic heart failure ([place]) 04/26/2018   Admitted 11/14-11/20/19-diuresed 10L   Depression 05/31/2021   Fatigue 10/14/2020   Hypertension    Myocardial infarction [facility])    Sleep apnea    uses a bipap machine   Stroke ([place]) 12/14/2020   no last weakness or  paralysis   Type 2 diabetes mellitus with diabetic nephropathy ([place]) 04/23/2019   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   Past Surgical History:  Procedure Laterality Date   AV FISTULA PLACEMENT Left 12/21/2021   Procedure: LEFT ARM ARTERIOVENOUS (AV) FISTULA CREATION;  Surgeon: Rosetta Posner, MD;  Location: [facility];  Service: Vascular;  Laterality: Left;   BIOPSY  06/27/2019   Procedure: BIOPSY;  Surgeon: Rogene Houston, MD;  Location: [facility];  Service: Endoscopy;;  ascending colon polyp   BIOPSY  10/21/2020   Procedure: BIOPSY;  Surgeon: Rogene Houston, MD;  [facility];  Service: Endoscopy;;  esophageal,gastric polyp   BIOPSY  02/24/2021   Procedure: BIOPSY;  Surgeon: Rogene Houston, MD;  [facility];  Service: Endoscopy;;  distal and proximal esophageal biopsies    CARDIAC SURGERY     CATARACT EXTRACTION     COLONOSCOPY N/A 06/27/2019   [name]:Diverticulosis in the entire examined colon. tubular adenoma in ascending colon, 50m tubular adenoma in prox sigmoid. external hemorrhoids   CORONARY ARTERY BYPASS GRAFT N/A 03/25/2019   Procedure: CORONARY ARTERY BYPASS GRAFTING (CABG) X  4 USING LEFT INTERNAL MAMMARY ARTERY AND RIGHT SAPHENOUS VEIN GRAFTS;  Surgeon: LLajuana Matte MD;  [place]  Service: Open Heart Surgery;  Laterality: N/A;   ESOPHAGOGASTRODUODENOSCOPY (EGD) WITH PROPOFOL N/A 10/21/2020   rehman:Normal hypopharynx.Normal  proximal esophagus and mid esophagus.Esophageal mucosal changes consistent with long-segment Barrett's esophagus. (Focal low-grade dysplasia and atypia proximally) z line irregular 37 cm from incisors, 3 cm HH, single gastric polyp (fundic gland) normal duodenal bulb/second portion of duodenum, proximal margin of Barrett's at 34 cm   ESOPHAGOGASTRODUODENOSCOPY (EGD) WITH PROPOFOL N/A 02/24/2021   Procedure: ESOPHAGOGASTRODUODENOSCOPY (EGD) WITH PROPOFOL;  Surgeon: Rogene Houston, MD;  [facility];   Service: Endoscopy;  Laterality: N/A;  1:35   ESOPHAGOGASTRODUODENOSCOPY (EGD) WITH PROPOFOL N/A 03/21/2021   Procedure: ESOPHAGOGASTRODUODENOSCOPY (EGD) WITH PROPOFOL;  Surgeon: Carol Ada, MD;  Location: [place];  Service: Endoscopy;  Laterality: N/A;   ESOPHAGOGASTRODUODENOSCOPY (EGD) WITH PROPOFOL N/A 10/23/2021   Procedure: ESOPHAGOGASTRODUODENOSCOPY (EGD) WITH PROPOFOL;  Surgeon: Doran Stabler, MD;  Location: [place];  Service: Gastroenterology;  Laterality: N/A;   EXCISION MORTON'S NEUROMA     EYE SURGERY Left    retina   INCISION AND DRAINAGE OF WOUND Right 06/28/2021   Procedure: IRRIGATION AND DEBRIDEMENT WOUND;  Surgeon: [name], MD;  Location: [place];  Service: Plastics;  Laterality: Right;  1.5 hour   INSERTION OF DIALYSIS CATHETER Right 12/21/2021   Procedure: INSERTION OF TUNNELED DIALYSIS CATHETER;  Surgeon: Rosetta Posner, MD;  Location: [facility];  Service: Vascular;  Laterality: Right;   LEFT HEART CATH AND CORS/GRAFTS ANGIOGRAPHY N/A 02/01/2022   Procedure: LEFT HEART CATH AND CORS/GRAFTS ANGIOGRAPHY;  Surgeon: Martinique, Peter M, MD;  Location: [place] [facility];  Service: Cardiovascular;  Laterality: N/A;   POLYPECTOMY  06/27/2019   Procedure: POLYPECTOMY;  Surgeon: Rogene Houston, MD;  [facility];  Service: Endoscopy;;  proximal sigmoid colon   RIGHT/LEFT HEART CATH AND CORONARY ANGIOGRAPHY N/A 03/12/2019   Procedure: RIGHT/LEFT HEART CATH AND CORONARY ANGIOGRAPHY;  Surgeon: [name], MD;  Location: [place] [facility];  Service: Cardiovascular;  Laterality: N/A;   SHOULDER ARTHROSCOPY WITH ROTATOR CUFF REPAIR AND SUBACROMIAL DECOMPRESSION Right 08/26/2020   Procedure: RIGHT SHOULDER MINI OPEN ROTATOR CUFF REPAIR AND SUBACROMIAL DECOMPRESSION WITH PATCH GRAFT;  Surgeon: Susa Day, MD;  Location: [facility];  Service: Orthopedics;  Laterality: Right;  90 MINS GENERAL WITH BLOCK   SKIN SPLIT GRAFT Right 06/28/2021   Procedure: SKIN  GRAFT SPLIT THICKNESS;  Surgeon: [name], MD;  Location: [place];  Service: Plastics;  Laterality: Right;   TEE WITHOUT CARDIOVERSION N/A 03/25/2019   Procedure: TRANSESOPHAGEAL ECHOCARDIOGRAM (TEE);  Surgeon: [name], MD;  Location: [place];  Service: Open Heart Surgery;  Laterality: N/A;   Family History:   Family History  Problem Relation Age of Onset   Colon cancer Mother    Heart Problems Father    Diabetes Father    Valvular heart disease Father    Sleep apnea Neg Hx    Stroke Neg Hx    Social History:  reports that he quit smoking about 33 years ago. His smoking use included cigarettes. He has never used smokeless tobacco. He reports that he does not currently use alcohol after a past usage of about 2.0 standard drinks of alcohol per week. He reports that he does not use drugs. Allergies  Allergen Reactions   Bee Venom Anaphylaxis   Atorvastatin Other (See Comments)    myalgia   Diltiazem Itching   Reglan [Metoclopramide] Other (See Comments)    Suicidal    Rosuvastatin Other (See Comments)    myalgias   Valsartan Itching   Prior to Admission medications  Medication Sig Start Date End Date Taking? Authorizing Provider  albuterol (VENTOLIN HFA) 108 (90 Base) MCG/ACT inhaler Inhale 2 puffs into the lungs every 6 (six) hours as needed. Patient taking differently: Inhale 2 puffs into the lungs every 6 (six) hours as needed for wheezing or shortness of breath. 06/24/21  Yes Cook, Jayce G, DO  ARIPiprazole (ABILIFY) 5 MG tablet Take 5 mg by mouth daily.   Yes [provider]  ascorbic acid (VITAMIN C) 500 MG tablet Take 1 tablet (500 mg total) by mouth daily. 08/03/21  Yes Allie Bossier, MD  aspirin EC 325 MG tablet Take 325 mg by mouth daily. 02/21/22  Yes [provider]  calcium acetate (PHOSLO) 667 MG capsule Take 667 mg by mouth in the morning, at noon, and at bedtime.   Yes [provider]  calcium-vitamin D (OSCAL WITH D) 500-5  MG-MCG tablet Take 1 tablet by mouth 2 (two) times daily.   Yes [provider]  carvedilol (COREG) 12.5 MG tablet Take 12.5 mg by mouth 2 (two) times daily. 02/21/22  Yes [provider]  doxazosin (CARDURA) 4 MG tablet Take 1 tablet (4 mg total) by mouth daily. 09/14/21  Yes Loel Dubonnet, NP  EPINEPHrine 0.3 mg/0.3 mL IJ SOAJ injection Inject 0.3 mg into the muscle as needed for anaphylaxis. 01/25/21  Yes [name], MD  hydrALAZINE (APRESOLINE) 100 MG tablet Take 1 tablet (100 mg total) by mouth 3 (three) times daily. 11/01/21  Yes Loel Dubonnet, NP  hydrOXYzine (VISTARIL) 25 MG capsule TAKE ONE CAPSULE BY MOUTH NIGHTLY AS NEEDED FOR ALLERGIES AND INSOMNIA Patient taking differently: Take 25 mg by mouth at bedtime as needed (allerigies and insomnia). 11/01/21  Yes [name], MD  isosorbide dinitrate (ISORDIL) 30 MG tablet Take 30 mg by mouth 3 (three) times daily. 02/21/22  Yes [provider]  lidocaine (ASPERCREME LIDOCAINE) 4 % Place 1 patch onto the skin daily. Wear for 12 hours then remove, wait 12 hours before applying a new patch to lower back   Yes [provider]  Multiple Vitamins-Minerals (THERATRUM COMPLETE) TABS Take 1 tablet by mouth daily.   Yes [provider]  nitroGLYCERIN (NITROSTAT) 0.4 MG SL tablet Place 1 tablet (0.4 mg total) under the tongue every 5 (five) minutes as needed for chest pain. 09/14/21  Yes Loel Dubonnet, NP  Nutritional Supplements (FEEDING SUPPLEMENT, NEPRO CARB STEADY,) LIQD Take 237 mLs by mouth 2 (two) times daily with a meal.   Yes [provider]  omeprazole (PRILOSEC) 40 MG capsule Take 40 mg by mouth in the morning and at bedtime.   Yes [provider]  ondansetron (ZOFRAN) 4 MG tablet Take 4 mg by mouth every 8 (eight) hours as needed for nausea or vomiting.   Yes [provider]  ondansetron (ZOFRAN-ODT) 4 MG disintegrating tablet Take 4 mg by mouth every 8 (eight)  hours as needed. 02/20/22  Yes [provider]  REPATHA SURECLICK 696 MG/ML SOAJ Inject 140 mg as directed every 14 (fourteen) days. Patient taking differently: Inject 140 mg as directed every 14 (fourteen) days. Starting 02/08/22 09/14/21  Yes Loel Dubonnet, NP  sertraline (ZOLOFT) 100 MG tablet 1 daily 02/11/22  Yes [name], [name], MD  torsemide (DEMADEX) 20 MG tablet Take 40 mg by mouth daily. 02/21/22  Yes [provider]  traZODone (DESYREL) 50 MG tablet Take 0.5-1 tablets (25-50 mg total) by mouth at bedtime as needed for  sleep. 02/11/22  Yes [name], MD  acetaminophen (TYLENOL) 325 MG tablet Take 2 tablets (650 mg total) by mouth every 6 (six) hours as needed for mild pain, moderate pain or fever. 08/02/21   Allie Bossier, MD  cholecalciferol (VITAMIN D) 25 MCG (1000 UNIT) tablet Take 1,000 Units by mouth daily. Patient not taking: Reported on 02/28/2022    [provider]  insulin aspart (NOVOLOG FLEXPEN) 100 UNIT/ML FlexPen Inject into the skin. Patient not taking: Reported on 02/28/2022 02/12/22   [provider]  insulin aspart (NOVOLOG) 100 UNIT/ML injection Inject 0-15 Units into the skin 3 (three) times daily before meals. Per sliding scale Patient not taking: Reported on 02/28/2022    [provider]  Insulin Pen Needle (B-D ULTRAFINE III SHORT PEN) 31G X 8 MM MISC 1 each by Does not apply route as directed. 05/30/19   Cassandria Anger, MD  isosorbide mononitrate (IMDUR) 30 MG 24 hr tablet Take 1 tablet (30 mg total) by mouth daily. Patient not taking: Reported on 02/28/2022 02/04/22   Niel Hummer A, MD  metoprolol tartrate (LOPRESSOR) 50 MG tablet Take 1 tablet (50 mg total) by mouth 2 (two) times daily. Patient not taking: Reported on 02/28/2022 02/03/22   Regalado, Jerald Kief A, MD  oxyCODONE-acetaminophen (PERCOCET/ROXICET) 5-325 MG tablet Take 1 tablet by mouth every 6 (six) hours as needed. Patient not taking: Reported on  02/28/2022 02/21/22   [provider]   Current Facility-Administered Medications  Medication Dose Route Frequency Provider Last Rate Last Admin   aspirin EC tablet 325 mg  325 mg Oral Daily Emokpae, Ejiroghene E, MD       calcium acetate (PHOSLO) capsule 667 mg  667 mg Oral TID WC Emokpae, Ejiroghene E, MD   667 mg at 03/01/22 0747   Chlorhexidine Gluconate Cloth 2 % PADS 6 each  6 each Topical Q0600 Zierle-Ghosh, Asia B, DO   6 each at 02/28/22 2218   doxazosin (CARDURA) tablet 4 mg  4 mg Oral Daily Emokpae, Ejiroghene E, MD       heparin injection 5,000 Units  5,000 Units Subcutaneous Q8H Emokpae, Ejiroghene E, MD   5,000 Units at 03/01/22 0515   hydrALAZINE (APRESOLINE) tablet 25 mg  25 mg Oral TID Arnoldo Lenis, MD       insulin aspart (novoLOG) injection 0-5 Units  0-5 Units Subcutaneous QHS Emokpae, Ejiroghene E, MD       insulin aspart (novoLOG) injection 0-9 Units  0-9 Units Subcutaneous TID WC Emokpae, Ejiroghene E, MD   3 Units at 03/01/22 0746   isosorbide dinitrate (ISORDIL) tablet 30 mg  30 mg Oral TID Emokpae, Ejiroghene E, MD   30 mg at 02/28/22 2304   metoprolol tartrate (LOPRESSOR) tablet 50 mg  50 mg Oral BID Arnoldo Lenis, MD       ondansetron [facility]) tablet 4 mg  4 mg Oral Q6H PRN Emokpae, Ejiroghene E, MD       Or   ondansetron (ZOFRAN) injection 4 mg  4 mg Intravenous Q6H PRN Emokpae, Ejiroghene E, MD       polyethylene glycol (MIRALAX / GLYCOLAX) packet 17 g  17 g Oral Daily PRN Emokpae, Ejiroghene E, MD       sertraline (ZOLOFT) tablet 100 mg  100 mg Oral Daily Emokpae, Ejiroghene E, MD       traZODone (DESYREL) tablet 25-50 mg  25-50 mg Oral QHS PRN Emokpae, Leanne Chang, MD  Labs: Basic Metabolic Panel: Recent Labs  Lab 02/28/22 1238 03/01/22 0040 03/01/22 0437  NA 138 135 137  K 4.4 4.0 4.1  CL 99 100 102  CO2 20* 21* 23  GLUCOSE 176* 195* 183*  BUN 48* 56* 59*  CREATININE 4.47* 5.22* 5.38*  CALCIUM 8.7* 8.1* 8.3*  PHOS  --  6.1*   --    Liver Function Tests: Recent Labs  Lab 02/28/22 1238  AST 33  ALT 32  ALKPHOS 135*  BILITOT 1.0  PROT 7.6  ALBUMIN 4.0   No results for input(s): "LIPASE", "AMYLASE" in the last 168 hours. No results for input(s): "AMMONIA" in the last 168 hours. CBC: Recent Labs  Lab 02/28/22 1238 03/01/22 0437  WBC 8.8 7.0  NEUTROABS 8.2*  --   HGB 10.7* 9.2*  HCT 35.0* 29.2*  MCV 87.5 85.6  PLT 198 157   Cardiac Enzymes: No results for input(s): "CKTOTAL", "CKMB", "CKMBINDEX", "TROPONINI" in the last 168 hours. CBG: Recent Labs  Lab 02/28/22 2215 03/01/22 0024 03/01/22 0720  GLUCAP 171* 175* 210*   Iron Studies: No results for input(s): "IRON", "TIBC", "TRANSFERRIN", "FERRITIN" in the last 72 hours. Studies/Results: CT Angio Chest Pulmonary Embolism (PE) W or WO Contrast  Result Date: 02/28/2022 CLINICAL DATA:  Acute respiratory failure, cough, shortness of breath, hypoxia EXAM: CT ANGIOGRAPHY CHEST WITH CONTRAST TECHNIQUE: Multidetector CT imaging of the chest was performed using the standard protocol during bolus administration of intravenous contrast. Multiplanar CT image reconstructions and MIPs were obtained to evaluate the vascular anatomy. RADIATION DOSE REDUCTION: This exam was performed according to the departmental dose-optimization program which includes automated exposure control, adjustment of the mA and/or kV according to patient size and/or use of iterative reconstruction technique. CONTRAST:  62m OMNIPAQUE IOHEXOL 350 MG/ML SOLN COMPARISON:  01/23/2022 FINDINGS: Cardiovascular: There is homogeneous enhancement in thoracic aorta. Coronary artery calcifications are seen. There is previous coronary artery bypass surgery. There is ectasia of main pulmonary artery measuring 4.1 cm suggesting pulmonary arterial hypertension. There are no intraluminal filling defects in central pulmonary artery branches. Evaluation of small peripheral pulmonary artery branches is limited by  motion artifacts and atelectasis. Heart is enlarged in size. There is reflux of contrast into hepatic veins suggesting tricuspid incompetence. Tip of right IJ central venous catheter is seen in the region of right atrium. Mediastinum/Nodes: No new significant lymphadenopathy seen. Lungs/Pleura: There is large right pleural effusion with significant interval increase. Density measurements in the right pleural effusion are less than 20 Hounsfield units. There is minimal left pleural effusion. There is compression atelectasis in right lower lobe and to a lesser extent in right middle lobe and right upper lobe. No focal infiltrates are seen in left lung. There is no pneumothorax. Upper Abdomen: There is fatty infiltration in the liver. Spleen is enlarged measuring 15.4 cm in AP diameter. Multiple diverticula are seen in visualized portions of colon. Musculoskeletal: Deformity in the right tenth rib may suggest old ununited fracture. Review of the MIP images confirms the above findings. IMPRESSION: There is no evidence of central pulmonary artery embolism. Evaluation of small peripheral pulmonary artery branches is limited by motion artifacts. There is ectasia of main pulmonary artery suggesting pulmonary arterial hypertension. Coronary artery disease. Previous coronary artery bypass surgery. Cardiomegaly. Tricuspid incompetence. There is large right pleural effusion with significant interval increase in size. Compression atelectasis is seen in right lung, especially in right lower lobe. There is minimal left pleural effusion. Fatty liver.  Enlarged spleen.  Diverticulosis  of colon. Other findings as described in the body of the report. Electronically Signed   By: Elmer Picker M.D.   On: 02/28/2022 18:07   DG Chest Port 1 View  Result Date: 02/28/2022 CLINICAL DATA:  Chest pain and dyspnea EXAM: PORTABLE CHEST 1 VIEW COMPARISON:  01/29/2022 chest radiograph. FINDINGS: Intact sternotomy wires. Pacer pad  overlies the left chest. Right internal jugular central venous catheter terminates over the cavoatrial junction. Stable cardiomediastinal silhouette with mild cardiomegaly. No pneumothorax. Small right pleural effusion is slightly increased. No left pleural effusion. Slightly low lung volumes, similar. Cephalization of the pulmonary vasculature without overt pulmonary edema. Mild hazy right lung base opacity is mildly increased. IMPRESSION: 1. Stable mild cardiomegaly without overt pulmonary edema. 2. Small right pleural effusion, slightly increased. 3. Mildly increased hazy right lung base opacity, favor atelectasis. Electronically Signed   By: Ilona Sorrel M.D.   On: 02/28/2022 13:40    ROS: Pertinent items are noted in HPI. Physical Exam: Vitals:   03/01/22 0930 03/01/22 1000 03/01/22 1017 03/01/22 1020  BP: (!) 141/64 132/60 (!) 141/55 (!) 136/59  Pulse: 69 69 70 71  Resp: (!) 22 (!) 21 (!) 22 20  Temp:      TempSrc:      SpO2: 98% 98% 99% 99%  Weight:      Height:          Weight change:   Intake/Output Summary (Last 24 hours) at 03/01/2022 1031 Last data filed at 03/01/2022 0741 Gross per 24 hour  Intake 577.31 ml  Output 250 ml  Net 327.31 ml   BP (!) 136/59   Pulse 71   Temp 97.6 F (36.4 C) (Oral)   Resp 20   Ht '5\' 10"'$  (1.778 m)   Wt 82.3 kg   SpO2 99%   BMI 26.03 kg/m  General appearance: no distress, pale, slowed mentation, and ill-appearing Head: Normocephalic, without obvious abnormality, atraumatic Resp: diminished breath sounds bibasilar Cardio: tachycardic into the 170's GI: soft, non-tender; bowel sounds normal; no masses,  no organomegaly Extremities: edema trace pretibial edema and LUE AVF +T/B Dialysis Access:  Outpatient Dialysis Orders: MWF [facility] 4h  300/600  80kg  3k/2.5Ca bath  Heparin none  RIJ TDC (maturing LUA AVF created on 12/21/21) - last hep B labs: none - mircera 30 ug q2  - iron sucrose '100mg'$  q hd IV thru 8/21 (finished) -  calcitriol 0.25 ug tiw po - last HD 9/18, post wt 82.7 kg (signed off 2 hours early on 9/18 and 1:45 early on 9/15)  Assessment/Plan:  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  Large right pleural effusion, s/p thoracentesis today with improved WOB.  Plan for serial HD to help UF and improve volume status as HR tolerates.  PSVT - cardiology following and to change coreg to lopressor for better rate control.    ESRD -  plan for short session of HD today and full session tomorrow to get back on schedule   Hypertension/volume  - as above, serial HD with UF as tolerated.  Anemia  - continue to follow H/H, redose ESA as needed.  Metabolic bone disease -   continue with home meds  Nutrition - renal diet, carb modified  Large right pleural effusion - s/p thoracentesis of 1.3 liters, clear dark yellow fluid removed.    Donetta Potts, MD [place] 03/01/2022, 10:31 AM

## 2022-03-01 NOTE — Progress Notes (Signed)
[place] Progress Note Patient Name: [name] DOB: [birth year] MRN: [id number]   Date of Service  03/01/2022  HPI/Events of Note  Contacted by [facility] nurse about patient with recurrent SVT and hypotension post dialysis.    eICU Interventions  NS 500 cc IV x 1 bolus ordered BMP and mg ordered stat Amiodarone ordered by primary service which is about to be given     Intervention Category Major Interventions: Arrhythmia - evaluation and management  Mauri [place], P 03/01/2022, 9:15 PM

## 2022-03-01 NOTE — Assessment & Plan Note (Addendum)
--   likely major contributor to his dyspneic symptoms -- 9/19 thora>>1.3L--appears exudative by Light's criteria -cytology negative -repeat attempt 03/04/22>> not enough fluid

## 2022-03-01 NOTE — Assessment & Plan Note (Signed)
--  continue DNR order while in hospital  ?

## 2022-03-01 NOTE — Progress Notes (Signed)
Went in to check on patient.  Patient still on 3L [place] and sat at 95%.  Patient sleeping comfortably.

## 2022-03-01 NOTE — Progress Notes (Signed)
Pharmacy Antibiotic Note  ILIAN [name] is a 66 y.o. male admitted on 02/28/2022 with bacteremia.  Pharmacy has been consulted for cefepime dosing.  Plan: Cefepime 2000 mg IV every MWF w/ HD Monitor labs, c/s, and patient improvement.  Height: '5\' 10"'$  (177.8 cm) Weight: 83.8 kg (184 lb 11.9 oz) IBW/kg (Calculated) : 73  Temp (24hrs), Avg:98.5 F (36.9 C), Min:97.6 F (36.4 C), Max:99.1 F (37.3 C)  Recent Labs  Lab 02/28/22 1238 02/28/22 1531 03/01/22 0040 03/01/22 0437  WBC 8.8  --   --  7.0  CREATININE 4.47*  --  5.22* 5.38*  LATICACIDVEN 3.5* 1.9  --   --     Estimated Creatinine Clearance: 14.1 mL/min (A) (by C-G formula based on SCr of 5.38 mg/dL (H)).    Allergies  Allergen Reactions   Bee Venom Anaphylaxis   Atorvastatin Other (See Comments)    myalgia   Diltiazem Itching   Reglan [Metoclopramide] Other (See Comments)    Suicidal    Rosuvastatin Other (See Comments)    myalgias   Valsartan Itching    Antimicrobials this admission: Cefepime 9/19 >> CTX 9/18 Azith 9/18   Microbiology results: 9/18 BCx: gram neg rods 2/4 bottles BCID: enterobacter cloacae complex 9/18 MRSA PCR: neg  Thank you for allowing pharmacy to be a part of this patient's care.  [name] 03/01/2022 5:13 PM

## 2022-03-01 NOTE — Progress Notes (Signed)
Lab result Aerobic bottle blood culture gram negative rods. Notified Dr. Wynetta Emery.

## 2022-03-01 NOTE — Progress Notes (Signed)
Patient tolerated right sided thoracentesis procedure well today and 1.3 Liters of clear dark yellow fluid removed. Pt verbalized understanding of post procedure instructions and taken to chest xray via stretcher at completion of procedure with no acute distress noted. Chest xray read by radiologist and patient transported back to inpatient bed assignment. Labs that were collected taken to lab at this time for processing.

## 2022-03-01 NOTE — Progress Notes (Signed)
Received patient in bed to unit.  Alert and oriented.  Informed consent signed and in chart.   Treatment initiated: 1630 Treatment completed: 1930  Patient not tolerated well.. because Pt had HR 180 during hemodialysis treatment.  Hand-off given to patient's nurse.   Access used:  catheter. Access issues: none  Total UF removed: 1.8 L Medication(s) given: Albumin 25 g IV. Post HD VS: 141/56 P 80 R 24 Post HD weight: 82 k g.   [facility]

## 2022-03-01 NOTE — Progress Notes (Signed)
*  PRELIMINARY RESULTS* Echocardiogram 2D Echocardiogram has been performed.  [name] 03/01/2022, 12:54 PM

## 2022-03-01 NOTE — Consult Note (Signed)
Cardiology Consultation   Patient ID: [name] MRN: [id number]; DOB: 1955/12/23  Admit date: 02/28/2022 Date of Consult: 03/01/2022  PCP:  [name], MD   [place] Providers Cardiologist:  Skeet Latch, MD        Patient Profile:   [name] is a 66 y.o. male with a hx of of 4 vessel CABG in 03/2019, HTN, HL, DM2, PAD, ESRD on HD, OSA, PSVT, chronic combined systolic/diastolic HF, esophageal cancer who is being seen 03/01/2022 for the evaluation of SVT at the request of Dr Wynetta Emery.  History of Present Illness:   [name] 66 yo male history of 4 vessel CABG in 03/2019, HTN, HL, DM2, PAD, ESRD on HD, OSA, PSVT, chronic combined systolic/diastolic HF, esophageal cancer admitted with SOB during dialysis. Reports compliance with HD sessions, has been on roughly 6 weeks. Found to be volume overloaded on presentation, fluid management per nephrology. Cardiology consulted for episode of SVT last night.     K 4.4 BUN 48 Cr 4.47 WBC 8.8 Hgb 10.7 Plt 198 BNP >4500 Lactic acid 3.5-->1.9 Trop 51-->52-->53 (chronically elevated) EKG SR, RAD, no acute ischemic changes PCXR small right effusion CT PE: no PE, large right pleural effusion   07/2021 echo: LVE 40-45%, mod RV dysfunction 01/2022 cath: severe 3 vessel native disease with patent grafts    Past Medical History:  Diagnosis Date   Anemia    Arthritis    hips shoulders   CAD (coronary artery disease) 03/03/2019   Cancer (HCC)    Esophageal cancer 2022   CHF (congestive heart failure) ([place]) 2020   Chronic combined systolic and diastolic heart failure ([place]) 04/26/2018   Admitted 11/14-11/20/19-diuresed 10L   Depression 05/31/2021   Fatigue 10/14/2020   Hypertension    Myocardial infarction ([place])    Sleep apnea    uses a bipap machine   Stroke ([place]) 12/14/2020   no last weakness or paralysis   Type 2 diabetes mellitus with diabetic nephropathy ([place]) 04/23/2019   Vision loss of right  eye 02/23/2021    Past Surgical History:  Procedure Laterality Date   AV FISTULA PLACEMENT Left 12/21/2021   Procedure: LEFT ARM ARTERIOVENOUS (AV) FISTULA CREATION;  Surgeon: Rosetta Posner, MD;  Location: [facility];  Service: Vascular;  Laterality: Left;   BIOPSY  06/27/2019   Procedure: BIOPSY;  Surgeon: Rogene Houston, MD;  [facility];  Service: Endoscopy;;  ascending colon polyp   BIOPSY  10/21/2020   Procedure: BIOPSY;  Surgeon: Rogene Houston, MD;  [facility];  Service: Endoscopy;;  esophageal,gastric polyp   BIOPSY  02/24/2021   Procedure: BIOPSY;  Surgeon: Rogene Houston, MD;  [facility];  Service: Endoscopy;;  distal and proximal esophageal biopsies    CARDIAC SURGERY     CATARACT EXTRACTION     COLONOSCOPY N/A 06/27/2019   [name]:Diverticulosis in the entire examined colon. tubular adenoma in ascending colon, 71m tubular adenoma in prox sigmoid. external hemorrhoids   CORONARY ARTERY BYPASS GRAFT N/A 03/25/2019   Procedure: CORONARY ARTERY BYPASS GRAFTING (CABG) X  4 USING LEFT INTERNAL MAMMARY ARTERY AND RIGHT SAPHENOUS VEIN GRAFTS;  Surgeon: LLajuana Matte MD;  [place] [facility]  Service: Open Heart Surgery;  Laterality: N/A;   ESOPHAGOGASTRODUODENOSCOPY (EGD) WITH PROPOFOL N/A 10/21/2020   rehman:Normal hypopharynx.Normal proximal esophagus and mid esophagus.Esophageal mucosal changes consistent with long-segment Barrett's esophagus. (Focal low-grade dysplasia and atypia proximally) z line irregular 37 cm  from incisors, 3 cm HH, single gastric polyp (fundic gland) normal duodenal bulb/second portion of duodenum, proximal margin of Barrett's at 34 cm   ESOPHAGOGASTRODUODENOSCOPY (EGD) WITH PROPOFOL N/A 02/24/2021   Procedure: ESOPHAGOGASTRODUODENOSCOPY (EGD) WITH PROPOFOL;  Surgeon: Rogene Houston, MD;  [facility];  Service: Endoscopy;  Laterality: N/A;  1:35   ESOPHAGOGASTRODUODENOSCOPY (EGD) WITH PROPOFOL N/A 03/21/2021    Procedure: ESOPHAGOGASTRODUODENOSCOPY (EGD) WITH PROPOFOL;  Surgeon: Carol Ada, MD;  Location: [facility];  Service: Endoscopy;  Laterality: N/A;   ESOPHAGOGASTRODUODENOSCOPY (EGD) WITH PROPOFOL N/A 10/23/2021   Procedure: ESOPHAGOGASTRODUODENOSCOPY (EGD) WITH PROPOFOL;  Surgeon: Doran Stabler, MD;  Location: [facility];  Service: Gastroenterology;  Laterality: N/A;   EXCISION MORTON'S NEUROMA     EYE SURGERY Left    retina   INCISION AND DRAINAGE OF WOUND Right 06/28/2021   Procedure: IRRIGATION AND DEBRIDEMENT WOUND;  Surgeon: [name], MD;  Location: [facility];  Service: Plastics;  Laterality: Right;  1.5 hour   INSERTION OF DIALYSIS CATHETER Right 12/21/2021   Procedure: INSERTION OF TUNNELED DIALYSIS CATHETER;  Surgeon: Rosetta Posner, MD;  [facility];  Service: Vascular;  Laterality: Right;   LEFT HEART CATH AND CORS/GRAFTS ANGIOGRAPHY N/A 02/01/2022   Procedure: LEFT HEART CATH AND CORS/GRAFTS ANGIOGRAPHY;  Surgeon: Martinique, Peter M, MD;  Location: [facility];  Service: Cardiovascular;  Laterality: N/A;   POLYPECTOMY  06/27/2019   Procedure: POLYPECTOMY;  Surgeon: Rogene Houston, MD;  [facility];  Service: Endoscopy;;  proximal sigmoid colon   RIGHT/LEFT HEART CATH AND CORONARY ANGIOGRAPHY N/A 03/12/2019   Procedure: RIGHT/LEFT HEART CATH AND CORONARY ANGIOGRAPHY;  Surgeon: [name], MD;  Location: [facility];  Service: Cardiovascular;  Laterality: N/A;   SHOULDER ARTHROSCOPY WITH ROTATOR CUFF REPAIR AND SUBACROMIAL DECOMPRESSION Right 08/26/2020   Procedure: RIGHT SHOULDER MINI OPEN ROTATOR CUFF REPAIR AND SUBACROMIAL DECOMPRESSION WITH PATCH GRAFT;  Surgeon: Susa Day, MD;  [facility];  Service: Orthopedics;  Laterality: Right;  90 MINS GENERAL WITH BLOCK   SKIN SPLIT GRAFT Right 06/28/2021   Procedure: SKIN GRAFT SPLIT THICKNESS;  Surgeon: [name], MD;  Location: [facility];  Service: Plastics;  Laterality:  Right;   TEE WITHOUT CARDIOVERSION N/A 03/25/2019   Procedure: TRANSESOPHAGEAL ECHOCARDIOGRAM (TEE);  Surgeon: [name], MD;  Location: [facility];  Service: Open Heart Surgery;  Laterality: N/A;       Inpatient Medications: Scheduled Meds:  aspirin EC  325 mg Oral Daily   calcium acetate  667 mg Oral TID WC   carvedilol  12.5 mg Oral BID   Chlorhexidine Gluconate Cloth  6 each Topical Q0600   doxazosin  4 mg Oral Daily   heparin  5,000 Units Subcutaneous Q8H   insulin aspart  0-5 Units Subcutaneous QHS   insulin aspart  0-9 Units Subcutaneous TID WC   isosorbide dinitrate  30 mg Oral TID   sertraline  100 mg Oral Daily   Continuous Infusions:  PRN Meds: ondansetron **OR** ondansetron (ZOFRAN) IV, polyethylene glycol, traZODone  Allergies:    Allergies  Allergen Reactions   Bee Venom Anaphylaxis   Atorvastatin Other (See Comments)    myalgia   Diltiazem Itching   Reglan [Metoclopramide] Other (See Comments)    Suicidal    Rosuvastatin Other (See Comments)    myalgias   Valsartan Itching    Social History:   Social History   Socioeconomic History   Marital status:  Single    Spouse name: Not on file   Number of children: Not on file   Years of education: Not on file   Highest education level: Not on file  Occupational History   Not on file  Tobacco Use   Smoking status: Former    Types: Cigarettes    Quit date: 07/06/1988    Years since quitting: 33.6   Smokeless tobacco: Never  Vaping Use   Vaping Use: Never used  Substance and Sexual Activity   Alcohol use: Not Currently    Alcohol/week: 2.0 standard drinks of alcohol    Types: 2 Glasses of wine per week    Comment: socially   Drug use: No   Sexual activity: Not on file  Other Topics Concern   Not on file  Social History Narrative   Not on file   Social Determinants of Health   Financial Resource Strain: Medium Risk (08/16/2021)   Overall Financial Resource Strain (CARDIA)    Difficulty of  Paying Living Expenses: Somewhat hard  Food Insecurity: No Food Insecurity (02/28/2022)   Hunger Vital Sign    Worried About Running Out of Food in the Last Year: Never true    Ran Out of Food in the Last Year: Never true  Transportation Needs: No Transportation Needs (02/28/2022)   PRAPARE - Hydrologist (Medical): No    Lack of Transportation (Non-Medical): No  Physical Activity: Not on file  Stress: Not on file  Social Connections: Not on file  Intimate Partner Violence: Not At Risk (02/28/2022)   Humiliation, Afraid, Rape, and Kick questionnaire    Fear of Current or Ex-Partner: No    Emotionally Abused: No    Physically Abused: No    Sexually Abused: No    Family History:    Family History  Problem Relation Age of Onset   Colon cancer Mother    Heart Problems Father    Diabetes Father    Valvular heart disease Father    Sleep apnea Neg Hx    Stroke Neg Hx      ROS:  Please see the history of present illness.   All other ROS reviewed and negative.     Physical Exam/Data:   Vitals:   03/01/22 0600 03/01/22 0700 03/01/22 0741 03/01/22 0800  BP:  (!) 125/58  (!) 151/77  Pulse: 69 66  72  Resp: '17 13  20  '$ Temp:   97.6 F (36.4 C)   TempSrc:   Oral   SpO2: 97% 98%  98%  Weight:      Height:        Intake/Output Summary (Last 24 hours) at 03/01/2022 0844 Last data filed at 03/01/2022 0741 Gross per 24 hour  Intake 577.31 ml  Output 250 ml  Net 327.31 ml      02/28/2022   10:25 PM 02/28/2022   12:34 PM 02/04/2022    3:47 PM  Last 3 Weights  Weight (lbs) 181 lb 7 oz 183 lb 179 lb 0.2 oz  Weight (kg) 82.3 kg 83.008 kg 81.2 kg     Body mass index is 26.03 kg/m.  General:  Well nourished, well developed, in no acute distress HEENT: normal Neck: +JVD Vascular: No carotid bruits; Distal pulses 2+ bilaterally Cardiac:  normal S1, S2; RRR; no murmur Lungs:  crackles bilaterally Abd: soft, nontender, no hepatomegaly  Ext: no  edema Musculoskeletal:  No deformities, BUE and BLE strength normal and equal Skin: warm  and dry  Neuro:  CNs 2-12 intact, no focal abnormalities noted Psych:  Normal affect     Laboratory Data:  High Sensitivity Troponin:   Recent Labs  Lab 02/28/22 1238 02/28/22 1532 03/01/22 0040  TROPONINIHS 51* 52* 53*     Chemistry Recent Labs  Lab 02/28/22 1238 03/01/22 0040 03/01/22 0437  NA 138 135 137  K 4.4 4.0 4.1  CL 99 100 102  CO2 20* 21* 23  GLUCOSE 176* 195* 183*  BUN 48* 56* 59*  CREATININE 4.47* 5.22* 5.38*  CALCIUM 8.7* 8.1* 8.3*  MG  --  2.1  --   GFRNONAA 14* 11* 11*  ANIONGAP 19* 14 12    Recent Labs  Lab 02/28/22 1238  PROT 7.6  ALBUMIN 4.0  AST 33  ALT 32  ALKPHOS 135*  BILITOT 1.0   Lipids No results for input(s): "CHOL", "TRIG", "HDL", "LABVLDL", "LDLCALC", "CHOLHDL" in the last 168 hours.  Hematology Recent Labs  Lab 02/28/22 1238 03/01/22 0437  WBC 8.8 7.0  RBC 4.00* 3.41*  HGB 10.7* 9.2*  HCT 35.0* 29.2*  MCV 87.5 85.6  MCH 26.8 27.0  MCHC 30.6 31.5  RDW 17.1* 17.4*  PLT 198 157   Thyroid No results for input(s): "TSH", "FREET4" in the last 168 hours.  BNP Recent Labs  Lab 02/28/22 1249  BNP >4,500.0*    DDimer No results for input(s): "DDIMER" in the last 168 hours.   Radiology/Studies:  CT Angio Chest Pulmonary Embolism (PE) W or WO Contrast  Result Date: 02/28/2022 CLINICAL DATA:  Acute respiratory failure, cough, shortness of breath, hypoxia EXAM: CT ANGIOGRAPHY CHEST WITH CONTRAST TECHNIQUE: Multidetector CT imaging of the chest was performed using the standard protocol during bolus administration of intravenous contrast. Multiplanar CT image reconstructions and MIPs were obtained to evaluate the vascular anatomy. RADIATION DOSE REDUCTION: This exam was performed according to the departmental dose-optimization program which includes automated exposure control, adjustment of the mA and/or kV according to patient size and/or  use of iterative reconstruction technique. CONTRAST:  42m OMNIPAQUE IOHEXOL 350 MG/ML SOLN COMPARISON:  01/23/2022 FINDINGS: Cardiovascular: There is homogeneous enhancement in thoracic aorta. Coronary artery calcifications are seen. There is previous coronary artery bypass surgery. There is ectasia of main pulmonary artery measuring 4.1 cm suggesting pulmonary arterial hypertension. There are no intraluminal filling defects in central pulmonary artery branches. Evaluation of small peripheral pulmonary artery branches is limited by motion artifacts and atelectasis. Heart is enlarged in size. There is reflux of contrast into hepatic veins suggesting tricuspid incompetence. Tip of right IJ central venous catheter is seen in the region of right atrium. Mediastinum/Nodes: No new significant lymphadenopathy seen. Lungs/Pleura: There is large right pleural effusion with significant interval increase. Density measurements in the right pleural effusion are less than 20 Hounsfield units. There is minimal left pleural effusion. There is compression atelectasis in right lower lobe and to a lesser extent in right middle lobe and right upper lobe. No focal infiltrates are seen in left lung. There is no pneumothorax. Upper Abdomen: There is fatty infiltration in the liver. Spleen is enlarged measuring 15.4 cm in AP diameter. Multiple diverticula are seen in visualized portions of colon. Musculoskeletal: Deformity in the right tenth rib may suggest old ununited fracture. Review of the MIP images confirms the above findings. IMPRESSION: There is no evidence of central pulmonary artery embolism. Evaluation of small peripheral pulmonary artery branches is limited by motion artifacts. There is ectasia of main pulmonary artery suggesting pulmonary arterial  hypertension. Coronary artery disease. Previous coronary artery bypass surgery. Cardiomegaly. Tricuspid incompetence. There is large right pleural effusion with significant interval  increase in size. Compression atelectasis is seen in right lung, especially in right lower lobe. There is minimal left pleural effusion. Fatty liver.  Enlarged spleen.  Diverticulosis of colon. Other findings as described in the body of the report. Electronically Signed   By: Elmer Picker M.D.   On: 02/28/2022 18:07   DG Chest Port 1 View  Result Date: 02/28/2022 CLINICAL DATA:  Chest pain and dyspnea EXAM: PORTABLE CHEST 1 VIEW COMPARISON:  01/29/2022 chest radiograph. FINDINGS: Intact sternotomy wires. Pacer pad overlies the left chest. Right internal jugular central venous catheter terminates over the cavoatrial junction. Stable cardiomediastinal silhouette with mild cardiomegaly. No pneumothorax. Small right pleural effusion is slightly increased. No left pleural effusion. Slightly low lung volumes, similar. Cephalization of the pulmonary vasculature without overt pulmonary edema. Mild hazy right lung base opacity is mildly increased. IMPRESSION: 1. Stable mild cardiomegaly without overt pulmonary edema. 2. Small right pleural effusion, slightly increased. 3. Mildly increased hazy right lung base opacity, favor atelectasis. Electronically Signed   By: Ilona Sorrel M.D.   On: 02/28/2022 13:40     Assessment and Plan:   1.Acute on chronic combined systolic/diastolic HF 11/6438 echo: LVE 40-45%, mod RV dysfunction - BNP >4500, large right effusion on CT - transiently on NG gtt in setting of HTN now off.  - fluid management per HD, defer to neprhology - - medical therapy with coreg 12.'5mg'$  bid, isordil. HOme hydral on hold on admit it appears, can start back at lower dose '25mg'$  tid. ARB allergy with itching so also not candidate for entresto, defer to his primary cardiologist whether to consider ACEi alone. Could consider aldactone now that he is on HD, not candidate for SLGT2i with ESRD - will repeat echo given degree of fluid overload on presentation  2. PSVT - noted during 01/2022 admission  and prior home monitors - was to change from coreg to lopressor at recent discharge. On coreg currently, change back to lopressor and titrate as needed. Can consolidate to toprol later in admission.  - K 4.1, Mg 2.1. TSH last month 2.2   3. Pleural effusion - per primary team   4. CAD - He is s/p CABG in 03/2019 - 01/2022 cath severe 3 vessel native disease, patent grafts SVG-OM2, SVG-D2, LIMA-lAD, SVG-RPL - mild trop 50 and flat chronically elevated and flat, not consistent with ACS  5. Hyperlipidemia - intolerance to statins, on repatha at home.      For questions or updates, please contact [place] Please consult [email or web address] for contact info under    Signed, Carlyle Dolly, MD  03/01/2022 8:44 AM
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
## 2022-03-01 NOTE — Progress Notes (Signed)
Pt went on sustained SVT as high as 170's on monitor, checked on pt, had some blank stare and unconscious upon assessment. Suddenly back to being conscious and was just asked to bear down and blew on the straw. Called [facility]. Labs ordered. MD notified and additional labs ordered. Pt placed on pads for now and back on NSR. Alert and oriented. Denies any chest pain and no other complains at this time.  Lear Ng, RN

## 2022-03-01 NOTE — Assessment & Plan Note (Addendum)
--  Pt found unresponsive in sustained SVT on 9/18, resolved after vagal maneuvers -- appreciate cardiology -continued to have SVT 9/20 evening, one episode 9/21 evening -switched coreg to metoprolol -started amiodarone drip>>continue  -continue metoprolol 75 mg bid

## 2022-03-01 NOTE — Progress Notes (Signed)
Reponded to call from ICU that patient had went unresponsive.  Upon entering room, ICU RNs were attempting vagal maneuver with patient due to SVT rhythm.  Maneuver worked, patient back in [employer].  Hooked up another flow meter just in case it was needed for O2 supply.  Will continue to monitor.

## 2022-03-01 NOTE — Assessment & Plan Note (Addendum)
-  E. [place] -Due to altered mental status, change antibiotics to merrem -repeat blood culture neg to date -cefepime>>merrem -d/c home with cefdinir x 3 days

## 2022-03-01 NOTE — Hospital Course (Signed)
66 y.o. male with medical history significant for CABG, ESRD, cardiomyopathy with systolic and diastolic CHF, stroke, hypertension, diabetes mellitus. Patient was brought to the ED from dialysis center with reports of worsening of his difficulty breathing.  At the time of my evaluation, patient is on BiPAP. Patient was having dialysis today and was 1 and 54 minutes into session when it had to be stopped.  He was having significant worsening of his breathing, with use of accessory muscle, was placed on 4 L nasal cannula, and BiPAP here. Patient reports has been having difficulty breathing over the past 2 days.  He reported cough productive of whitish phlegm.  On arrival to the ED there was some wheezing.   Recent hospitalization 8/19 to 8/25 for chest pain, transferred to [facility], due to history of previous CABG.  Underwent cardiac cath 8/22 which showed patent CABG.   ED Course: Temperature 98.9.  Initial heart rate 103 improved to 70s, respiratory rate initially up to 30 improved after BiPAP, blood pressure 120s to 170s.  O2 sats 90% on 4 L nasal cannula, subsequently switched to BiPAP due to tachycardia, tachypnea, accessory muscle use and increased work of breathing. Chest x-ray with mild cardiomegaly without overt pulmonary edema, small right pleural effusion slightly increased, hazy right lung base opacity favor atelectasis. VBG with pH of 7.3, PCO2 of 43.  Lactic acid 3.5.  Troponin 51.   IV ceftriaxone and azithromycin started. Initial concern for flash pulmonary edema, hence nitroglycerin drip initially started and later discontinued with improvement in respiratory status. However, the patient was noted to have gram-negative bacteremia, and he was started on cefepime empirically.  On the morning of 03/03/2022, the patient was a little more somnolent and confused.  However, he woke up to voice and was able to answer questions appropriately when he was awake.  Work-up was undertaken to clarify  etiology of his altered mental status.

## 2022-03-01 NOTE — Progress Notes (Signed)
Patient currently on 3L [place] with sat of 97%.  WOB is good at this time but Bipap at bedside if needed.

## 2022-03-02 DIAGNOSIS — I5043 Acute on chronic combined systolic (congestive) and diastolic (congestive) heart failure: Secondary | ICD-10-CM | POA: Diagnosis not present

## 2022-03-02 DIAGNOSIS — R7881 Bacteremia: Secondary | ICD-10-CM

## 2022-03-02 DIAGNOSIS — Z66 Do not resuscitate: Secondary | ICD-10-CM

## 2022-03-02 DIAGNOSIS — C159 Malignant neoplasm of esophagus, unspecified: Secondary | ICD-10-CM

## 2022-03-02 DIAGNOSIS — J9601 Acute respiratory failure with hypoxia: Secondary | ICD-10-CM | POA: Diagnosis not present

## 2022-03-02 LAB — CBC
HCT: 28.5 % — ABNORMAL LOW (ref 39.0–52.0)
Hemoglobin: 9 g/dL — ABNORMAL LOW (ref 13.0–17.0)
MCH: 26.9 pg (ref 26.0–34.0)
MCHC: 31.6 g/dL (ref 30.0–36.0)
MCV: 85.1 fL (ref 80.0–100.0)
Platelets: 148 10*3/uL — ABNORMAL LOW (ref 150–400)
RBC: 3.35 MIL/uL — ABNORMAL LOW (ref 4.22–5.81)
RDW: 17.3 % — ABNORMAL HIGH (ref 11.5–15.5)
WBC: 6.3 10*3/uL (ref 4.0–10.5)
nRBC: 0 % (ref 0.0–0.2)

## 2022-03-02 LAB — BODY FLUID CELL COUNT WITH DIFFERENTIAL
Eos, Fluid: 1 %
Lymphs, Fluid: 28 %
Monocyte-Macrophage-Serous Fluid: 57 % (ref 50–90)
Neutrophil Count, Fluid: 14 % (ref 0–25)
Total Nucleated Cell Count, Fluid: 160 cu mm (ref 0–1000)

## 2022-03-02 LAB — GLUCOSE, CAPILLARY
Glucose-Capillary: 177 mg/dL — ABNORMAL HIGH (ref 70–99)
Glucose-Capillary: 236 mg/dL — ABNORMAL HIGH (ref 70–99)
Glucose-Capillary: 185 mg/dL — ABNORMAL HIGH (ref 70–99)
Glucose-Capillary: 155 mg/dL — ABNORMAL HIGH (ref 70–99)

## 2022-03-02 LAB — MISC LABCORP TEST (SEND OUT)

## 2022-03-02 LAB — RENAL FUNCTION PANEL
Albumin: 3.3 g/dL — ABNORMAL LOW (ref 3.5–5.0)
Anion gap: 13 (ref 5–15)
BUN: 51 mg/dL — ABNORMAL HIGH (ref 8–23)
CO2: 23 mmol/L (ref 22–32)
Calcium: 8.1 mg/dL — ABNORMAL LOW (ref 8.9–10.3)
Chloride: 99 mmol/L (ref 98–111)
Creatinine, Ser: 4.76 mg/dL — ABNORMAL HIGH (ref 0.61–1.24)
GFR, Estimated: 13 mL/min — ABNORMAL LOW (ref >60)
Glucose, Bld: 194 mg/dL — ABNORMAL HIGH (ref 70–99)
Phosphorus: 4.3 mg/dL (ref 2.5–4.6)
Potassium: 3.7 mmol/L (ref 3.5–5.1)
Sodium: 135 mmol/L (ref 135–145)

## 2022-03-02 LAB — CYTOLOGY - NON PAP

## 2022-03-02 LAB — MAGNESIUM: Magnesium: 1.9 mg/dL (ref 1.7–2.4)

## 2022-03-02 NOTE — Progress Notes (Signed)
Received patient in bed to unit.  Alert and oriented.  Informed consent signed and in chart.   Treatment initiated: 1620 Treatment completed: 1950  Patient tolerated well.  Transported back to the room  Alert, without acute distress.  Hand-off given to patient's nurse.   Access used: catheter Access issues: none  Total UF removed: 1800 ml  Medication(s) given: none Post HD VS: 135/66 P 66 R 20 Post HD weight: 80.6kg   [facility]

## 2022-03-02 NOTE — Progress Notes (Signed)
Rounding Note    Patient Name: [name] Date of Encounter: 03/02/2022  [place] Cardiologist: Skeet Latch, MD   Subjective   Shortness of breath improved but still not back at baseline  Inpatient Medications    Scheduled Meds:  aspirin EC  324 mg Oral Daily   calcium acetate  667 mg Oral TID WC   Chlorhexidine Gluconate Cloth  6 each Topical Q0600   doxazosin  4 mg Oral Daily   feeding supplement (NEPRO CARB STEADY)  237 mL Oral BID BM   heparin  5,000 Units Subcutaneous Q8H   hydrALAZINE  25 mg Oral TID   insulin aspart  0-5 Units Subcutaneous QHS   insulin aspart  0-9 Units Subcutaneous TID WC   isosorbide dinitrate  30 mg Oral TID   metoprolol tartrate  75 mg Oral BID   sertraline  100 mg Oral Daily   Continuous Infusions:  amiodarone 30 mg/hr (03/02/22 0858)   ceFEPime (MAXIPIME) IV     PRN Meds: ondansetron **OR** ondansetron (ZOFRAN) IV, polyethylene glycol, traZODone   Vital Signs    Vitals:   03/02/22 0500 03/02/22 0600 03/02/22 0700 03/02/22 0800  BP: (!) 140/58 (!) 138/50 134/61 (!) 160/60  Pulse: 63 64 63 65  Resp: '16 11 18 19  '$ Temp:   98.3 F (36.8 C)   TempSrc:   Oral   SpO2: 100%  96% 98%  Weight: 80.5 kg     Height:        Intake/Output Summary (Last 24 hours) at 03/02/2022 0955 Last data filed at 03/02/2022 0858 Gross per 24 hour  Intake 710.86 ml  Output 1500 ml  Net -789.14 ml      03/02/2022    5:00 AM 03/01/2022    7:21 PM 03/01/2022    4:52 PM  Last 3 Weights  Weight (lbs) 177 lb 7.5 oz 180 lb 12.4 oz 184 lb 11.9 oz  Weight (kg) 80.5 kg 82 kg 83.8 kg      Telemetry    SVT 183 bpm paroxysmal, currently sinus rhythm- Personally Reviewed  ECG    Sinus rhythm 90 nonspecific ST-T wave changes- Personally Reviewed, multiple  Physical Exam   GEN: No acute distress.  Chronically ill-appearing Neck: No JVD, dialysis catheter Cardiac: RRR, no murmurs, rubs, or gallops.  Respiratory: Decreased right  base GI: Soft, nontender, non-distended  MS: Trace edema; No deformity. Neuro:  Nonfocal  Psych: Normal affect   Labs    High Sensitivity Troponin:   Recent Labs  Lab 02/28/22 1238 02/28/22 1532 03/01/22 0040  TROPONINIHS 51* 52* 53*     Chemistry Recent Labs  Lab 02/28/22 1238 03/01/22 0040 03/01/22 0437 03/01/22 2123 03/02/22 0238  NA 138 135 137 134* 135  K 4.4 4.0 4.1 3.3* 3.7  CL 99 100 102 98 99  CO2 20* 21* '23 24 23  '$ GLUCOSE 176* 195* 183* 180* 194*  BUN 48* 56* 59* 49* 51*  CREATININE 4.47* 5.22* 5.38* 4.41* 4.76*  CALCIUM 8.7* 8.1* 8.3* 8.0* 8.1*  MG  --  2.1  --  1.8 1.9  PROT 7.6  --   --   --   --   ALBUMIN 4.0  --   --   --  3.3*  AST 33  --   --   --   --   ALT 32  --   --   --   --   ALKPHOS 135*  --   --   --   --  BILITOT 1.0  --   --   --   --   GFRNONAA 14* 11* 11* 14* 13*  ANIONGAP 19* '14 12 12 13    '$ Lipids No results for input(s): "CHOL", "TRIG", "HDL", "LABVLDL", "LDLCALC", "CHOLHDL" in the last 168 hours.  Hematology Recent Labs  Lab 02/28/22 1238 03/01/22 0437 03/02/22 0238  WBC 8.8 7.0 6.3  RBC 4.00* 3.41* 3.35*  HGB 10.7* 9.2* 9.0*  HCT 35.0* 29.2* 28.5*  MCV 87.5 85.6 85.1  MCH 26.8 27.0 26.9  MCHC 30.6 31.5 31.6  RDW 17.1* 17.4* 17.3*  PLT 198 157 148*   Thyroid No results for input(s): "TSH", "FREET4" in the last 168 hours.  BNP Recent Labs  Lab 02/28/22 1249  BNP >4,500.0*    DDimer No results for input(s): "DDIMER" in the last 168 hours.   Radiology    ECHOCARDIOGRAM COMPLETE  Result Date: 03/01/2022    ECHOCARDIOGRAM REPORT   Patient Name:   OIVA DIBARI Date of Exam: 03/01/2022 Medical Rec #:  [id number]        Height:       70.0 in Accession #:    [id number]       Weight:       181.4 lb Date of Birth:  08/26/1955        BSA:          2.003 m Patient Age:   66 years         BP:           131/55 mmHg Patient Gender: M                HR:           69 bpm. Exam Location:  [facility] Procedure: 2D Echo, Cardiac  Doppler and Color Doppler Indications:    Dyspnea  History:        Patient has prior history of Echocardiogram examinations, most                 recent 07/25/2021. CHF and Cardiomyopathy, CAD, Prior CABG,                 Stroke and PAD, Arrythmias:Tachycardia, Signs/Symptoms:Shortness                 of Breath, Edema and Chest Pain; Risk Factors:Hypertension,                 Diabetes and Dyslipidemia.  Sonographer:    Wenda Low Referring Phys: [id number] [place]  1. Left ventricular ejection fraction, by estimation, is 40 to 45%. The left ventricle has mildly decreased function. The left ventricle demonstrates global hypokinesis. There is moderate left ventricular hypertrophy. Left ventricular diastolic parameters are indeterminate.  2. Ventricular septum is flattened in systole consistent with RV pressure overload. . Right ventricular systolic function is moderately reduced. The right ventricular size is severely enlarged. There is mildly elevated pulmonary artery systolic pressure.  3. Left atrial size was severely dilated.  4. Catheter tip noted in RA. Right atrial size was severely dilated.  5. The mitral valve is normal in structure. No evidence of mitral valve regurgitation. No evidence of mitral stenosis.  6. The tricuspid valve is abnormal.  7. The aortic valve is tricuspid. There is mild calcification of the aortic valve. There is mild thickening of the aortic valve. Aortic valve regurgitation is not visualized. No aortic stenosis is present.  8. The inferior vena cava is dilated in  size with >50% respiratory variability, suggesting right atrial pressure of 8 mmHg. FINDINGS  Left Ventricle: Left ventricular ejection fraction, by estimation, is 40 to 45%. The left ventricle has mildly decreased function. The left ventricle demonstrates global hypokinesis. The left ventricular internal cavity size was normal in size. There is  moderate left ventricular hypertrophy. Left ventricular  diastolic parameters are indeterminate. Right Ventricle: Ventricular septum is flattened in systole consistent with RV pressure overload. The right ventricular size is severely enlarged. Right vetricular wall thickness was not well visualized. Right ventricular systolic function is moderately reduced. There is mildly elevated pulmonary artery systolic pressure. The tricuspid regurgitant velocity is 2.91 m/s, and with an assumed right atrial pressure of 8 mmHg, the estimated right ventricular systolic pressure is 70.2 mmHg. Left Atrium: Left atrial size was severely dilated. Right Atrium: Catheter tip noted in RA. Right atrial size was severely dilated. Pericardium: There is no evidence of pericardial effusion. Mitral Valve: The mitral valve is normal in structure. No evidence of mitral valve regurgitation. No evidence of mitral valve stenosis. MV peak gradient, 4.2 mmHg. The mean mitral valve gradient is 2.0 mmHg. Tricuspid Valve: The tricuspid valve is abnormal. Tricuspid valve regurgitation is mild . No evidence of tricuspid stenosis. Aortic Valve: The aortic valve is tricuspid. There is mild calcification of the aortic valve. There is mild thickening of the aortic valve. There is mild aortic valve annular calcification. Aortic valve regurgitation is not visualized. No aortic stenosis  is present. Aortic valve mean gradient measures 3.5 mmHg. Aortic valve peak gradient measures 7.2 mmHg. Aortic valve area, by VTI measures 2.94 cm. Pulmonic Valve: The pulmonic valve was not well visualized. Pulmonic valve regurgitation is not visualized. No evidence of pulmonic stenosis. Aorta: The aortic root is normal in size and structure. Venous: The inferior vena cava is dilated in size with greater than 50% respiratory variability, suggesting right atrial pressure of 8 mmHg. IAS/Shunts: No atrial level shunt detected by color flow Doppler.  LEFT VENTRICLE PLAX 2D LVIDd:         5.40 cm      Diastology LVIDs:         4.30 cm       LV e' medial:    5.87 cm/s LV PW:         1.40 cm      LV E/e' medial:  14.5 LV IVS:        1.20 cm      LV e' lateral:   11.70 cm/s LVOT diam:     2.10 cm      LV E/e' lateral: 7.3 LV SV:         81 LV SV Index:   41 LVOT Area:     3.46 cm  LV Volumes (MOD) LV vol d, MOD A2C: 118.0 ml LV vol d, MOD A4C: 98.3 ml LV vol s, MOD A2C: 85.2 ml LV vol s, MOD A4C: 48.6 ml LV SV MOD A2C:     32.8 ml LV SV MOD A4C:     98.3 ml LV SV MOD BP:      39.3 ml RIGHT VENTRICLE RV Basal diam:  4.80 cm RV Mid diam:    3.50 cm RV S prime:     10.10 cm/s TAPSE (M-mode): 1.7 cm LEFT ATRIUM              Index        RIGHT ATRIUM  Index LA diam:        5.80 cm  2.90 cm/m   RA Area:     26.40 cm LA Vol (A2C):   140.0 ml 69.91 ml/m  RA Volume:   84.40 ml  42.14 ml/m LA Vol (A4C):   91.4 ml  45.64 ml/m LA Biplane Vol: 114.0 ml 56.92 ml/m  AORTIC VALVE                    PULMONIC VALVE AV Area (Vmax):    2.66 cm     PV Vmax:       0.87 m/s AV Area (Vmean):   2.71 cm     PV Peak grad:  3.0 mmHg AV Area (VTI):     2.94 cm AV Vmax:           134.00 cm/s AV Vmean:          88.233 cm/s AV VTI:            0.277 m AV Peak Grad:      7.2 mmHg AV Mean Grad:      3.5 mmHg LVOT Vmax:         103.00 cm/s LVOT Vmean:        69.000 cm/s LVOT VTI:          0.235 m LVOT/AV VTI ratio: 0.85  AORTA Ao Root diam: 3.90 cm Ao Asc diam:  3.40 cm MITRAL VALVE               TRICUSPID VALVE MV Area (PHT): 3.05 cm    TR Peak grad:   33.9 mmHg MV Area VTI:   2.56 cm    TR Vmax:        291.00 cm/s MV Peak grad:  4.2 mmHg MV Mean grad:  2.0 mmHg    SHUNTS MV Vmax:       1.02 m/s    Systemic VTI:  0.24 m MV Vmean:      61.2 cm/s   Systemic Diam: 2.10 cm MV Decel Time: 249 msec MV E velocity: 85.30 cm/s MV A velocity: 75.80 cm/s MV E/A ratio:  1.13 Carlyle Dolly MD Electronically signed by Carlyle Dolly MD Signature Date/Time: 03/01/2022/1:08:36 PM    Final    DG Chest 1 View  Result Date: 03/01/2022 CLINICAL DATA:  Status post right  thoracentesis. EXAM: CHEST  1 VIEW COMPARISON:  CT chest and chest x-ray from yesterday. FINDINGS: Unchanged tunneled right internal jugular dialysis catheter. Unchanged mild cardiomegaly status post CABG. Decreased now trace small right pleural effusion. No pneumothorax. Improved aeration of the right lower lobe. No acute osseous abnormality. IMPRESSION: 1. Decreased right pleural effusion status post thoracentesis. No pneumothorax. Electronically Signed   By: Titus Dubin M.D.   On: 03/01/2022 11:26   US THORACENTESIS ASP PLEURAL SPACE W/IMG GUIDE  Result Date: 03/01/2022 INDICATION: Right pleural effusion. EXAM: ULTRASOUND GUIDED RIGHT THORACENTESIS MEDICATIONS: None. COMPLICATIONS: None immediate. PROCEDURE: An ultrasound guided thoracentesis was thoroughly discussed with the patient and questions answered. The benefits, risks, alternatives and complications were also discussed. The patient understands and wishes to proceed with the procedure. Written consent was obtained. Ultrasound was performed to localize and Zayde Stroupe an adequate pocket of fluid in the right chest. The area was then prepped and draped in the normal sterile fashion. 1% Lidocaine was used for local anesthesia. Under ultrasound guidance a 6 Fr Safe-T-Centesis catheter was introduced. Thoracentesis was performed. The catheter was removed and a dressing applied.  FINDINGS: A total of approximately 1.3 L of dark yellow fluid was removed. Samples were sent to the laboratory as requested by the clinical team. IMPRESSION: Successful ultrasound guided right thoracentesis yielding 1.3 L of pleural fluid. Electronically Signed   By: Titus Dubin M.D.   On: 03/01/2022 11:23   CT Angio Chest Pulmonary Embolism (PE) W or WO Contrast  Result Date: 02/28/2022 CLINICAL DATA:  Acute respiratory failure, cough, shortness of breath, hypoxia EXAM: CT ANGIOGRAPHY CHEST WITH CONTRAST TECHNIQUE: Multidetector CT imaging of the chest was performed using the  standard protocol during bolus administration of intravenous contrast. Multiplanar CT image reconstructions and MIPs were obtained to evaluate the vascular anatomy. RADIATION DOSE REDUCTION: This exam was performed according to the departmental dose-optimization program which includes automated exposure control, adjustment of the mA and/or kV according to patient size and/or use of iterative reconstruction technique. CONTRAST:  36m OMNIPAQUE IOHEXOL 350 MG/ML SOLN COMPARISON:  01/23/2022 FINDINGS: Cardiovascular: There is homogeneous enhancement in thoracic aorta. Coronary artery calcifications are seen. There is previous coronary artery bypass surgery. There is ectasia of main pulmonary artery measuring 4.1 cm suggesting pulmonary arterial hypertension. There are no intraluminal filling defects in central pulmonary artery branches. Evaluation of small peripheral pulmonary artery branches is limited by motion artifacts and atelectasis. Heart is enlarged in size. There is reflux of contrast into hepatic veins suggesting tricuspid incompetence. Tip of right IJ central venous catheter is seen in the region of right atrium. Mediastinum/Nodes: No new significant lymphadenopathy seen. Lungs/Pleura: There is large right pleural effusion with significant interval increase. Density measurements in the right pleural effusion are less than 20 Hounsfield units. There is minimal left pleural effusion. There is compression atelectasis in right lower lobe and to a lesser extent in right middle lobe and right upper lobe. No focal infiltrates are seen in left lung. There is no pneumothorax. Upper Abdomen: There is fatty infiltration in the liver. Spleen is enlarged measuring 15.4 cm in AP diameter. Multiple diverticula are seen in visualized portions of colon. Musculoskeletal: Deformity in the right tenth rib may suggest old ununited fracture. Review of the MIP images confirms the above findings. IMPRESSION: There is no evidence of  central pulmonary artery embolism. Evaluation of small peripheral pulmonary artery branches is limited by motion artifacts. There is ectasia of main pulmonary artery suggesting pulmonary arterial hypertension. Coronary artery disease. Previous coronary artery bypass surgery. Cardiomegaly. Tricuspid incompetence. There is large right pleural effusion with significant interval increase in size. Compression atelectasis is seen in right lung, especially in right lower lobe. There is minimal left pleural effusion. Fatty liver.  Enlarged spleen.  Diverticulosis of colon. Other findings as described in the body of the report. Electronically Signed   By: PElmer PickerM.D.   On: 02/28/2022 18:07   DG Chest Port 1 View  Result Date: 02/28/2022 CLINICAL DATA:  Chest pain and dyspnea EXAM: PORTABLE CHEST 1 VIEW COMPARISON:  01/29/2022 chest radiograph. FINDINGS: Intact sternotomy wires. Pacer pad overlies the left chest. Right internal jugular central venous catheter terminates over the cavoatrial junction. Stable cardiomediastinal silhouette with mild cardiomegaly. No pneumothorax. Small right pleural effusion is slightly increased. No left pleural effusion. Slightly low lung volumes, similar. Cephalization of the pulmonary vasculature without overt pulmonary edema. Mild hazy right lung base opacity is mildly increased. IMPRESSION: 1. Stable mild cardiomegaly without overt pulmonary edema. 2. Small right pleural effusion, slightly increased. 3. Mildly increased hazy right lung base opacity, favor atelectasis. Electronically Signed   By:  Ilona Sorrel M.D.   On: 02/28/2022 13:40    Cardiac Studies   Cardiac catheterization 02/01/2022: Severe 3 vessel obstructive CAD Patent LIMA to the LAD Patent SVG to large second diagonal Patent SVG to OM Patent SVG to PL branch of the RCA Elevated LVEDP 25 mm Hg  Echocardiogram 03/01/2022:   1. Left ventricular ejection fraction, by estimation, is 40 to 45%. The  left  ventricle has mildly decreased function. The left ventricle  demonstrates global hypokinesis. There is moderate left ventricular  hypertrophy. Left ventricular diastolic  parameters are indeterminate.   2. Ventricular septum is flattened in systole consistent with RV pressure  overload. . Right ventricular systolic function is moderately reduced. The  right ventricular size is severely enlarged. There is mildly elevated  pulmonary artery systolic  pressure.   3. Left atrial size was severely dilated.   4. Catheter tip noted in RA. Right atrial size was severely dilated.   5. The mitral valve is normal in structure. No evidence of mitral valve  regurgitation. No evidence of mitral stenosis.   6. The tricuspid valve is abnormal.   7. The aortic valve is tricuspid. There is mild calcification of the  aortic valve. There is mild thickening of the aortic valve. Aortic valve  regurgitation is not visualized. No aortic stenosis is present.   8. The inferior vena cava is dilated in size with >50% respiratory  variability, suggesting right atrial pressure of 8 mmHg.   Patient Profile     66 y.o. male with CAD post CABG with recent cardiac cath  Assessment & Plan    Acute on chronic systolic and diastolic heart failure -Echo this admit EF 40 to 45% unchanged from prior. - Fluid management per hemodialysis -His medical therapy has been carvedilol 12.5 mg twice a day as well as isosorbide.  He has been changed to pulmonology may help with his SVT. -Home hydralazine has been restarted at 25 3 times daily. -Unable to utilize SGLT2 inhibitor such as Jardiance with end-stage renal disease.  Usually would avoid ACE and ARB in the setting.  PSVT -Multiple episodes last evening heart rates of 183 bpm.  Amiodarone IV started.  Seems quiet this morning. -Continue with IV amiodarone today, transition to oral tomorrow.  May be able to ultimately come off of the amiodarone in the future if we are able to  control SVT with metoprolol. - Previously noted in August during admission and on prior home monitoring.  He was changed from carvedilol to Lopressor prior to discharge and he is on carvedilol currently.  Changed back to Lopressor yesterday titrate as needed. -Consolidate to Toprol at the end of this admission. -Brief episode on night of admission.  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  Reassurance  Hyperlipidemia - LDL goal less than 55.  He is intolerant to statins.  He is on Repatha at home.  End-stage renal disease - Per nephrology team.  Pleural effusion large right pleural effusion no PE As per primary team.    For questions or updates, please contact [place] Please consult [email or web address] for contact info under        Signed, Candee Furbish, MD  03/02/2022, 9:55 AM

## 2022-03-02 NOTE — Progress Notes (Signed)
Patient requested to be put back on Bipap for the rest of the night. Will continue to monitor.

## 2022-03-02 NOTE — Progress Notes (Signed)
Patient ID: [name], male   DOB: 13-May-1956, 66 y.o.   MRN: [id number] S:Mr. Swearengin developed SVT and hypotension after HD.  Given IV NS bolus and started on amiodarone.  Feels nauseated this am but overall better.  O:BP (!) 160/60   Pulse 65   Temp 98.3 F (36.8 C) (Oral)   Resp 19   Ht '5\' 10"'$  (1.778 m)   Wt 80.5 kg   SpO2 98%   BMI 25.46 kg/m   Intake/Output Summary (Last 24 hours) at 03/02/2022 0940 Last data filed at 03/02/2022 0858 Gross per 24 hour  Intake 710.86 ml  Output 1500 ml  Net -789.14 ml   Intake/Output: I/O last 3 completed shifts: In: 790 [P.O.:690; IV Piggyback:100] Out: 1750 [Urine:250; Other:1500]  Intake/Output this shift:  Total I/O In: 370.9 [I.V.:370.9] Out: -  Weight change: 0.792 kg Gen: NAD CVS: RRR Resp:CTA, with decreased BS at bases Abd: +BS, soft, NT/ND Ext: no edema, LUE AVF +T/B  Recent Labs  Lab 02/28/22 1238 03/01/22 0040 03/01/22 0437 03/01/22 2123 03/02/22 0238  NA 138 135 137 134* 135  K 4.4 4.0 4.1 3.3* 3.7  CL 99 100 102 98 99  CO2 20* 21* '23 24 23  '$ GLUCOSE 176* 195* 183* 180* 194*  BUN 48* 56* 59* 49* 51*  CREATININE 4.47* 5.22* 5.38* 4.41* 4.76*  ALBUMIN 4.0  --   --   --  3.3*  CALCIUM 8.7* 8.1* 8.3* 8.0* 8.1*  PHOS  --  6.1*  --   --  4.3  AST 33  --   --   --   --   ALT 32  --   --   --   --    Liver Function Tests: Recent Labs  Lab 02/28/22 1238 03/02/22 0238  AST 33  --   ALT 32  --   ALKPHOS 135*  --   BILITOT 1.0  --   PROT 7.6  --   ALBUMIN 4.0 3.3*   No results for input(s): "LIPASE", "AMYLASE" in the last 168 hours. No results for input(s): "AMMONIA" in the last 168 hours. CBC: Recent Labs  Lab 02/28/22 1238 03/01/22 0437 03/02/22 0238  WBC 8.8 7.0 6.3  NEUTROABS 8.2*  --   --   HGB 10.7* 9.2* 9.0*  HCT 35.0* 29.2* 28.5*  MCV 87.5 85.6 85.1  PLT 198 157 148*   Cardiac Enzymes: No results for input(s): "CKTOTAL", "CKMB", "CKMBINDEX", "TROPONINI" in the last 168  hours. CBG: Recent Labs  Lab 03/01/22 0720 03/01/22 1104 03/01/22 1559 03/01/22 2057 03/02/22 0726  GLUCAP 210* 154* 243* 164* 177*    Iron Studies: No results for input(s): "IRON", "TIBC", "TRANSFERRIN", "FERRITIN" in the last 72 hours. Studies/Results: ECHOCARDIOGRAM COMPLETE  Result Date: 03/01/2022    ECHOCARDIOGRAM REPORT   Patient Name:   VON QUINTANAR Date of Exam: 03/01/2022 Medical Rec #:  [id number]        Height:       70.0 in Accession #:    [id number]       Weight:       181.4 lb Date of Birth:  05/20/1956        BSA:          2.003 m Patient Age:    66 years         BP:           131/55 mmHg Patient Gender: M  HR:           69 bpm. Exam Location:  [facility] Procedure: 2D Echo, Cardiac Doppler and Color Doppler Indications:    Dyspnea  History:        Patient has prior history of Echocardiogram examinations, most                 recent 07/25/2021. CHF and Cardiomyopathy, CAD, Prior CABG,                 Stroke and PAD, Arrythmias:Tachycardia, Signs/Symptoms:Shortness                 of Breath, Edema and Chest Pain; Risk Factors:Hypertension,                 Diabetes and Dyslipidemia.  Sonographer:    Wenda Low Referring Phys: [id number] [place]  1. Left ventricular ejection fraction, by estimation, is 40 to 45%. The left ventricle has mildly decreased function. The left ventricle demonstrates global hypokinesis. There is moderate left ventricular hypertrophy. Left ventricular diastolic parameters are indeterminate.  2. Ventricular septum is flattened in systole consistent with RV pressure overload. . Right ventricular systolic function is moderately reduced. The right ventricular size is severely enlarged. There is mildly elevated pulmonary artery systolic pressure.  3. Left atrial size was severely dilated.  4. Catheter tip noted in RA. Right atrial size was severely dilated.  5. The mitral valve is normal in structure. No evidence of mitral  valve regurgitation. No evidence of mitral stenosis.  6. The tricuspid valve is abnormal.  7. The aortic valve is tricuspid. There is mild calcification of the aortic valve. There is mild thickening of the aortic valve. Aortic valve regurgitation is not visualized. No aortic stenosis is present.  8. The inferior vena cava is dilated in size with >50% respiratory variability, suggesting right atrial pressure of 8 mmHg. FINDINGS  Left Ventricle: Left ventricular ejection fraction, by estimation, is 40 to 45%. The left ventricle has mildly decreased function. The left ventricle demonstrates global hypokinesis. The left ventricular internal cavity size was normal in size. There is  moderate left ventricular hypertrophy. Left ventricular diastolic parameters are indeterminate. Right Ventricle: Ventricular septum is flattened in systole consistent with RV pressure overload. The right ventricular size is severely enlarged. Right vetricular wall thickness was not well visualized. Right ventricular systolic function is moderately reduced. There is mildly elevated pulmonary artery systolic pressure. The tricuspid regurgitant velocity is 2.91 m/s, and with an assumed right atrial pressure of 8 mmHg, the estimated right ventricular systolic pressure is 39.0 mmHg. Left Atrium: Left atrial size was severely dilated. Right Atrium: Catheter tip noted in RA. Right atrial size was severely dilated. Pericardium: There is no evidence of pericardial effusion. Mitral Valve: The mitral valve is normal in structure. No evidence of mitral valve regurgitation. No evidence of mitral valve stenosis. MV peak gradient, 4.2 mmHg. The mean mitral valve gradient is 2.0 mmHg. Tricuspid Valve: The tricuspid valve is abnormal. Tricuspid valve regurgitation is mild . No evidence of tricuspid stenosis. Aortic Valve: The aortic valve is tricuspid. There is mild calcification of the aortic valve. There is mild thickening of the aortic valve. There is mild  aortic valve annular calcification. Aortic valve regurgitation is not visualized. No aortic stenosis  is present. Aortic valve mean gradient measures 3.5 mmHg. Aortic valve peak gradient measures 7.2 mmHg. Aortic valve area, by VTI measures 2.94 cm. Pulmonic Valve: The pulmonic valve was not well  visualized. Pulmonic valve regurgitation is not visualized. No evidence of pulmonic stenosis. Aorta: The aortic root is normal in size and structure. Venous: The inferior vena cava is dilated in size with greater than 50% respiratory variability, suggesting right atrial pressure of 8 mmHg. IAS/Shunts: No atrial level shunt detected by color flow Doppler.  LEFT VENTRICLE PLAX 2D LVIDd:         5.40 cm      Diastology LVIDs:         4.30 cm      LV e' medial:    5.87 cm/s LV PW:         1.40 cm      LV E/e' medial:  14.5 LV IVS:        1.20 cm      LV e' lateral:   11.70 cm/s LVOT diam:     2.10 cm      LV E/e' lateral: 7.3 LV SV:         81 LV SV Index:   41 LVOT Area:     3.46 cm  LV Volumes (MOD) LV vol d, MOD A2C: 118.0 ml LV vol d, MOD A4C: 98.3 ml LV vol s, MOD A2C: 85.2 ml LV vol s, MOD A4C: 48.6 ml LV SV MOD A2C:     32.8 ml LV SV MOD A4C:     98.3 ml LV SV MOD BP:      39.3 ml RIGHT VENTRICLE RV Basal diam:  4.80 cm RV Mid diam:    3.50 cm RV S prime:     10.10 cm/s TAPSE (M-mode): 1.7 cm LEFT ATRIUM              Index        RIGHT ATRIUM           Index LA diam:        5.80 cm  2.90 cm/m   RA Area:     26.40 cm LA Vol (A2C):   140.0 ml 69.91 ml/m  RA Volume:   84.40 ml  42.14 ml/m LA Vol (A4C):   91.4 ml  45.64 ml/m LA Biplane Vol: 114.0 ml 56.92 ml/m  AORTIC VALVE                    PULMONIC VALVE AV Area (Vmax):    2.66 cm     PV Vmax:       0.87 m/s AV Area (Vmean):   2.71 cm     PV Peak grad:  3.0 mmHg AV Area (VTI):     2.94 cm AV Vmax:           134.00 cm/s AV Vmean:          88.233 cm/s AV VTI:            0.277 m AV Peak Grad:      7.2 mmHg AV Mean Grad:      3.5 mmHg LVOT Vmax:         103.00 cm/s  LVOT Vmean:        69.000 cm/s LVOT VTI:          0.235 m LVOT/AV VTI ratio: 0.85  AORTA Ao Root diam: 3.90 cm Ao Asc diam:  3.40 cm MITRAL VALVE               TRICUSPID VALVE MV Area (PHT): 3.05 cm    TR Peak grad:   33.9 mmHg MV Area VTI:  2.56 cm    TR Vmax:        291.00 cm/s MV Peak grad:  4.2 mmHg MV Mean grad:  2.0 mmHg    SHUNTS MV Vmax:       1.02 m/s    Systemic VTI:  0.24 m MV Vmean:      61.2 cm/s   Systemic Diam: 2.10 cm MV Decel Time: 249 msec MV E velocity: 85.30 cm/s MV A velocity: 75.80 cm/s MV E/A ratio:  1.13 Carlyle Dolly MD Electronically signed by Carlyle Dolly MD Signature Date/Time: 03/01/2022/1:08:36 PM    Final    DG Chest 1 View  Result Date: 03/01/2022 CLINICAL DATA:  Status post right thoracentesis. EXAM: CHEST  1 VIEW COMPARISON:  CT chest and chest x-ray from yesterday. FINDINGS: Unchanged tunneled right internal jugular dialysis catheter. Unchanged mild cardiomegaly status post CABG. Decreased now trace small right pleural effusion. No pneumothorax. Improved aeration of the right lower lobe. No acute osseous abnormality. IMPRESSION: 1. Decreased right pleural effusion status post thoracentesis. No pneumothorax. Electronically Signed   By: Titus Dubin M.D.   On: 03/01/2022 11:26   US THORACENTESIS ASP PLEURAL SPACE W/IMG GUIDE  Result Date: 03/01/2022 INDICATION: Right pleural effusion. EXAM: ULTRASOUND GUIDED RIGHT THORACENTESIS MEDICATIONS: None. COMPLICATIONS: None immediate. PROCEDURE: An ultrasound guided thoracentesis was thoroughly discussed with the patient and questions answered. The benefits, risks, alternatives and complications were also discussed. The patient understands and wishes to proceed with the procedure. Written consent was obtained. Ultrasound was performed to localize and mark an adequate pocket of fluid in the right chest. The area was then prepped and draped in the normal sterile fashion. 1% Lidocaine was used for local anesthesia. Under  ultrasound guidance a 6 Fr Safe-T-Centesis catheter was introduced. Thoracentesis was performed. The catheter was removed and a dressing applied. FINDINGS: A total of approximately 1.3 L of dark yellow fluid was removed. Samples were sent to the laboratory as requested by the clinical team. IMPRESSION: Successful ultrasound guided right thoracentesis yielding 1.3 L of pleural fluid. Electronically Signed   By: Titus Dubin M.D.   On: 03/01/2022 11:23   CT Angio Chest Pulmonary Embolism (PE) W or WO Contrast  Result Date: 02/28/2022 CLINICAL DATA:  Acute respiratory failure, cough, shortness of breath, hypoxia EXAM: CT ANGIOGRAPHY CHEST WITH CONTRAST TECHNIQUE: Multidetector CT imaging of the chest was performed using the standard protocol during bolus administration of intravenous contrast. Multiplanar CT image reconstructions and MIPs were obtained to evaluate the vascular anatomy. RADIATION DOSE REDUCTION: This exam was performed according to the departmental dose-optimization program which includes automated exposure control, adjustment of the mA and/or kV according to patient size and/or use of iterative reconstruction technique. CONTRAST:  53m OMNIPAQUE IOHEXOL 350 MG/ML SOLN COMPARISON:  01/23/2022 FINDINGS: Cardiovascular: There is homogeneous enhancement in thoracic aorta. Coronary artery calcifications are seen. There is previous coronary artery bypass surgery. There is ectasia of main pulmonary artery measuring 4.1 cm suggesting pulmonary arterial hypertension. There are no intraluminal filling defects in central pulmonary artery branches. Evaluation of small peripheral pulmonary artery branches is limited by motion artifacts and atelectasis. Heart is enlarged in size. There is reflux of contrast into hepatic veins suggesting tricuspid incompetence. Tip of right IJ central venous catheter is seen in the region of right atrium. Mediastinum/Nodes: No new significant lymphadenopathy seen.  Lungs/Pleura: There is large right pleural effusion with significant interval increase. Density measurements in the right pleural effusion are less than 20 Hounsfield units. There is minimal  left pleural effusion. There is compression atelectasis in right lower lobe and to a lesser extent in right middle lobe and right upper lobe. No focal infiltrates are seen in left lung. There is no pneumothorax. Upper Abdomen: There is fatty infiltration in the liver. Spleen is enlarged measuring 15.4 cm in AP diameter. Multiple diverticula are seen in visualized portions of colon. Musculoskeletal: Deformity in the right tenth rib may suggest old ununited fracture. Review of the MIP images confirms the above findings. IMPRESSION: There is no evidence of central pulmonary artery embolism. Evaluation of small peripheral pulmonary artery branches is limited by motion artifacts. There is ectasia of main pulmonary artery suggesting pulmonary arterial hypertension. Coronary artery disease. Previous coronary artery bypass surgery. Cardiomegaly. Tricuspid incompetence. There is large right pleural effusion with significant interval increase in size. Compression atelectasis is seen in right lung, especially in right lower lobe. There is minimal left pleural effusion. Fatty liver.  Enlarged spleen.  Diverticulosis of colon. Other findings as described in the body of the report. Electronically Signed   By: Elmer Picker M.D.   On: 02/28/2022 18:07   DG Chest Port 1 View  Result Date: 02/28/2022 CLINICAL DATA:  Chest pain and dyspnea EXAM: PORTABLE CHEST 1 VIEW COMPARISON:  01/29/2022 chest radiograph. FINDINGS: Intact sternotomy wires. Pacer pad overlies the left chest. Right internal jugular central venous catheter terminates over the cavoatrial junction. Stable cardiomediastinal silhouette with mild cardiomegaly. No pneumothorax. Small right pleural effusion is slightly increased. No left pleural effusion. Slightly low lung  volumes, similar. Cephalization of the pulmonary vasculature without overt pulmonary edema. Mild hazy right lung base opacity is mildly increased. IMPRESSION: 1. Stable mild cardiomegaly without overt pulmonary edema. 2. Small right pleural effusion, slightly increased. 3. Mildly increased hazy right lung base opacity, favor atelectasis. Electronically Signed   By: Ilona Sorrel M.D.   On: 02/28/2022 13:40    aspirin EC  324 mg Oral Daily   calcium acetate  667 mg Oral TID WC   Chlorhexidine Gluconate Cloth  6 each Topical Q0600   doxazosin  4 mg Oral Daily   feeding supplement (NEPRO CARB STEADY)  237 mL Oral BID BM   heparin  5,000 Units Subcutaneous Q8H   hydrALAZINE  25 mg Oral TID   insulin aspart  0-5 Units Subcutaneous QHS   insulin aspart  0-9 Units Subcutaneous TID WC   isosorbide dinitrate  30 mg Oral TID   metoprolol tartrate  75 mg Oral BID   sertraline  100 mg Oral Daily    BMET    Component Value Date/Time   NA 135 03/02/2022 0238   NA 138 10/07/2021 1202   K 3.7 03/02/2022 0238   CL 99 03/02/2022 0238   CO2 23 03/02/2022 0238   GLUCOSE 194 (H) 03/02/2022 0238   BUN 51 (H) 03/02/2022 0238   BUN 42 (H) 10/07/2021 1202   CREATININE 4.76 (H) 03/02/2022 0238   CREATININE 1.94 (H) 05/13/2019 1029   CALCIUM 8.1 (L) 03/02/2022 0238   GFRNONAA 13 (L) 03/02/2022 0238   GFRAA 35 (L) 03/10/2020 1332   CBC    Component Value Date/Time   WBC 6.3 03/02/2022 0238   RBC 3.35 (L) 03/02/2022 0238   HGB 9.0 (L) 03/02/2022 0238   HGB 9.0 (L) 11/01/2021 1439   HCT 28.5 (L) 03/02/2022 0238   HCT 28.1 (L) 11/01/2021 1439   PLT 148 (L) 03/02/2022 0238   PLT 254 11/01/2021 1439   MCV 85.1 03/02/2022 0238  MCV 88 11/01/2021 1439   MCH 26.9 03/02/2022 0238   MCHC 31.6 03/02/2022 0238   RDW 17.3 (H) 03/02/2022 0238   RDW 15.9 (H) 11/01/2021 1439   LYMPHSABS 0.4 (L) 02/28/2022 1238   LYMPHSABS 0.9 10/07/2021 1202   MONOABS 0.1 02/28/2022 1238   EOSABS 0.0 02/28/2022 1238    EOSABS 0.2 10/07/2021 1202   BASOSABS 0.0 02/28/2022 1238   BASOSABS 0.1 10/07/2021 1202    Outpatient Dialysis Orders: MWF [facility] [place] 4h  300/600  80kg  3k/2.5Ca bath  Heparin none  RIJ TDC (maturing LUA AVF created on 12/21/21) - last hep B labs: none - mircera 30 ug q2  - iron sucrose '100mg'$  q hd IV thru 8/21 (finished) - calcitriol 0.25 ug tiw po - last HD 9/18, post wt 82.7 kg (signed off 2 hours early on 9/18 and 1:45 early on 9/15)   Assessment/Plan:  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  Large right pleural effusion, s/p thoracentesis today with improved WOB.  Plan for serial HD to help UF and improve volume status as HR tolerates.  PSVT - cardiology following and to change coreg to lopressor for better rate control.  Started on IV amiodarone last night due to recurrence after HD.  Will change HD orders to 4K bath and decrease goal UF as his volume has improved.   ESRD -  had short session of HD 9/19 with 1.8L UF but followed by SVT and hypotension and give IV bolus of 500 mL.  Plan for another session today to get back on schedule.  Will use 4K and decrease UF goal as above.   Hypertension/volume  - as above, serial HD with UF as tolerated.  Anemia  - continue to follow H/H, redose ESA as needed.  Metabolic bone disease -   continue with home meds  Nutrition - renal diet, carb modified  Large right pleural effusion - s/p thoracentesis of 1.3 liters, clear dark yellow fluid removed 03/01/22.    Donetta Potts, MD [facility]

## 2022-03-02 NOTE — Assessment & Plan Note (Signed)
Follows GI at [facility], Dr. [name] --considered poor candidate for esophagectomy. After multidisciplinary discussion the plan was to continue with endoscopic treatments, and continue to follow-up with oncology for surveillance and any signs of progression of disease -status post EMR, cryotherapy, and most recently RFA in April 2023 -02/10/22 PET scan--no local recurrence

## 2022-03-02 NOTE — Assessment & Plan Note (Addendum)
07/25/21 Echo--EF 40-45%, no WMA, mod RV dysfx, PASP 56.5 - BNP >4500, large right effusion on CT - transiently on NG gtt in setting of HTN now off.  - fluid management per HD, defer to neprhology 03/01/22 Echo--EF 40-45%, global HK, RV overload -03/02/2022 UF 1.8L 03/04/22-HD Volume overload status improving

## 2022-03-02 NOTE — Inpatient Diabetes Management (Signed)
Inpatient Diabetes Program Recommendations  AACE/ADA: New Consensus Statement on Inpatient Glycemic Control   Target Ranges:  Prepandial:   less than 140 mg/dL      Peak postprandial:   less than 180 mg/dL (1-2 hours)      Critically ill patients:  140 - 180 mg/dL    Latest Reference Range & Units 03/02/22 02:38  Glucose 70 - 99 mg/dL 194 (H)    Latest Reference Range & Units 03/01/22 07:20 03/01/22 11:04 03/01/22 15:59 03/01/22 20:57  Glucose-Capillary 70 - 99 mg/dL 210 (H) 154 (H) 243 (H) 164 (H)   Review of Glycemic Control  Diabetes history: DM2 Outpatient Diabetes medications: Novolog 0-15 units TID with meals Current orders for Inpatient glycemic control: Novolog 0-9 units TID with meals, Novolog 0-5 units QHS  Inpatient Diabetes Program Recommendations:    Insulin: Please consider ordering Semglee 5 units Q24H.  Thanks Barnie Alderman, RN, MSN, [place] [facility] Coordinator Inpatient Diabetes Program [phone] (Team Pager from 8am to [place])

## 2022-03-02 NOTE — Progress Notes (Signed)
PROGRESS NOTE  MICHAE [name] FIE:[id number] DOB: 1955-07-27 DOA: 02/28/2022 PCP: [name], MD  Brief History:  66 y.o. male with medical history significant for CABG, ESRD, cardiomyopathy with systolic and diastolic CHF, stroke, hypertension, diabetes mellitus. Patient was brought to the ED from dialysis center with reports of worsening of his difficulty breathing.  At the time of my evaluation, patient is on BiPAP. Patient was having dialysis today and was 1 and 54 minutes into session when it had to be stopped.  He was having significant worsening of his breathing, with use of accessory muscle, was placed on 4 L nasal cannula, and BiPAP here. Patient reports has been having difficulty breathing over the past 2 days.  He reported cough productive of whitish phlegm.  On arrival to the ED there was some wheezing.   Recent hospitalization 8/19 to 8/25 for chest pain, transferred to [facility], due to history of previous CABG.  Underwent cardiac cath 8/22 which showed patent CABG.   ED Course: Temperature 98.9.  Initial heart rate 103 improved to 70s, respiratory rate initially up to 30 improved after BiPAP, blood pressure 120s to 170s.  O2 sats 90% on 4 L nasal cannula, subsequently switched to BiPAP due to tachycardia, tachypnea, accessory muscle use and increased work of breathing. Chest x-ray with mild cardiomegaly without overt pulmonary edema, small right pleural effusion slightly increased, hazy right lung base opacity favor atelectasis. VBG with pH of 7.3, PCO2 of 43.  Lactic acid 3.5.  Troponin 51.   IV ceftriaxone and azithromycin started. Initial concern for flash pulmonary edema, hence nitroglycerin drip initially started and later discontinued with improvement in respiratory status.     Assessment and Plan: * Acute respiratory failure with hypoxia (HCC) Difficulty breathing over the past 2 days PTA -placed on BiPAP due to increased work of breathing.    -Respiratory status has improved after thora (1.3L removed).  Quit smoking cigarettes about 33 years ago. . -Started on BiPAP and nitroglycerin drip which was discontinued after improvement in respiratory status. -Hold off on further antibiotics at this time -Obtain CTA chest, I talked to nephrologist Dr. [name], okay to administer IV contrast at this time -Lactic acid 3.5 > 1.9 resolved without intervention, likely secondary to hypoxia, doubt infection. -Addendum-no PE, shows large right pleural effusion increased in size from prior.  Likely etiology for his hypoxia.  -ordered ultrasound-guided thoracentesis  DM type 2 causing vascular disease (HCC) A1c 7.7.  Glucose 176. - continue SSI   Essential hypertension, benign Systolic 166A to 630Z. -Resume doxazosin, Imdur, metoprolol  -hydralazine restarted by cardiology team 9/19  Acute on chronic combined systolic and diastolic CHF (congestive heart failure) ([place]) 07/25/21 Echo--EF 40-45%, no WMA, mod RV dysfx, PASP 56.5 - BNP >4500, large right effusion on CT - transiently on NG gtt in setting of HTN now off.  - fluid management per HD, defer to neprhology 03/01/22 Echo--EF 40-45%, global HK, RV overload  SVT (supraventricular tachycardia) (HCC) --Pt found unresponsive in sustained SVT on 9/18, resolved after vagal maneuvers -- appreciate cardiology -continued to have SVT 9/19 -switched coreg to metoprolol -started amiodarone drip>>continue till 9/20 -continue metoprolol  Pleural effusion on right -- likely major contributor to his dyspneic symptoms -- 9/19 thora>>1.3L--appears exudative by Light's criteria -cytology negative  Esophageal adenocarcinoma (HCC) Follows GI at [facility], Dr. [name] --considered poor candidate for esophagectomy. After multidisciplinary discussion the plan was to continue with endoscopic treatments, and  continue to follow-up with oncology for surveillance and any signs of progression of  disease -status post EMR, cryotherapy, and most recently RFA in April 2023 -02/10/22 PET scan--no local recurrence   DNR (do not resuscitate) --continue DNR order while in hospital   gram neg rod Bacteremia -E. Cloacae -continue empiric cefepime pending final culture  ESRD on dialysis [facility]) Started dialysis within the past month.  Schedule Monday Wednesday Friday.  HD session terminated today due to sudden worsening of his dyspnea.  I talked with Dr. Arty Baumgartner today, okay with IV contrast for now. -nephrology consulted to manage inpatient HD -pt says he did not complete HD on  9/18   Obstructive sleep apnea BIPAP QHS while hospitalized.  S/P CABG (coronary artery bypass graft) Recent cardiac cath 8/22, showed patent grafts, severe three-vessel native CAD.Marland Kitchen  History of CABG 2020.  Troponin today 51 > 52, not consistent with ACS -Resume aspirin, metoprolol, Imdur.  Follow up TTE pending.        Family Communication:   significant other at bedside updated  Consultants:  cardiology  Code Status:  FULL   DVT Prophylaxis:  [place] Heparin    Procedures: As Listed in Progress Note Above  Antibiotics: Ceftriaxone 9/18 Cefepime 9/19>>     Subjective: Patient denies fevers, chills, headache, chest pain, dyspnea, nausea, vomiting, diarrhea, abdominal pain, dysuria, hematuria, hematochezia, and melena.   Objective: Vitals:   03/02/22 0900 03/02/22 1000 03/02/22 1100 03/02/22 1137  BP: (!) 137/58 (!) 131/56 (!) 127/55   Pulse: 64 64 65   Resp: '17 19 19   '$ Temp:    98.3 F (36.8 C)  TempSrc:    Oral  SpO2: 98% 98% 99%   Weight:      Height:        Intake/Output Summary (Last 24 hours) at 03/02/2022 1544 Last data filed at 03/02/2022 0858 Gross per 24 hour  Intake 470.86 ml  Output 1500 ml  Net -1029.14 ml   Weight change: 0.792 kg Exam:  General:  Pt is alert, follows commands appropriately, not in acute distress HEENT: No icterus, No thrush, No neck mass,  [place]/AT Cardiovascular: RRR, S1/S2, no rubs, no gallops Respiratory: fine bibasilar crackles. No wheeze Abdomen: Soft/+BS, non tender, non distended, no guarding Extremities: No edema, No lymphangitis, No petechiae, No rashes, no synovitis   Data Reviewed: I have personally reviewed following labs and imaging studies Basic Metabolic Panel: Recent Labs  Lab 02/28/22 1238 03/01/22 0040 03/01/22 0437 03/01/22 2123 03/02/22 0238  NA 138 135 137 134* 135  K 4.4 4.0 4.1 3.3* 3.7  CL 99 100 102 98 99  CO2 20* 21* '23 24 23  '$ GLUCOSE 176* 195* 183* 180* 194*  BUN 48* 56* 59* 49* 51*  CREATININE 4.47* 5.22* 5.38* 4.41* 4.76*  CALCIUM 8.7* 8.1* 8.3* 8.0* 8.1*  MG  --  2.1  --  1.8 1.9  PHOS  --  6.1*  --   --  4.3   Liver Function Tests: Recent Labs  Lab 02/28/22 1238 03/02/22 0238  AST 33  --   ALT 32  --   ALKPHOS 135*  --   BILITOT 1.0  --   PROT 7.6  --   ALBUMIN 4.0 3.3*   No results for input(s): "LIPASE", "AMYLASE" in the last 168 hours. No results for input(s): "AMMONIA" in the last 168 hours. Coagulation Profile: Recent Labs  Lab 02/28/22 1238  INR 1.4*   CBC: Recent Labs  Lab 02/28/22 1238 03/01/22  1610 03/02/22 0238  WBC 8.8 7.0 6.3  NEUTROABS 8.2*  --   --   HGB 10.7* 9.2* 9.0*  HCT 35.0* 29.2* 28.5*  MCV 87.5 85.6 85.1  PLT 198 157 148*   Cardiac Enzymes: No results for input(s): "CKTOTAL", "CKMB", "CKMBINDEX", "TROPONINI" in the last 168 hours. BNP: Invalid input(s): "POCBNP" CBG: Recent Labs  Lab 03/01/22 1104 03/01/22 1559 03/01/22 2057 03/02/22 0726 03/02/22 1113  GLUCAP 154* 243* 164* 177* 236*   HbA1C: No results for input(s): "HGBA1C" in the last 72 hours. Urine analysis:    Component Value Date/Time   COLORURINE YELLOW 02/28/2022 1414   APPEARANCEUR CLEAR 02/28/2022 1414   LABSPEC 1.014 02/28/2022 1414   PHURINE 5.0 02/28/2022 1414   GLUCOSEU >=500 (A) 02/28/2022 1414   HGBUR NEGATIVE 02/28/2022 1414   BILIRUBINUR NEGATIVE  02/28/2022 1414   KETONESUR NEGATIVE 02/28/2022 1414   PROTEINUR >=300 (A) 02/28/2022 1414   NITRITE NEGATIVE 02/28/2022 1414   LEUKOCYTESUR NEGATIVE 02/28/2022 1414   Sepsis Labs: '@LABRCNTIP'$ (procalcitonin:4,lacticidven:4) ) Recent Results (from the past 240 hour(s))  Culture, blood (Routine x 2)     Status: None (Preliminary result)   Collection Time: 02/28/22 12:38 PM   Specimen: BLOOD RIGHT FOREARM  Result Value Ref Range Status   Specimen Description   Final    BLOOD RIGHT FOREARM BOTTLES DRAWN AEROBIC AND ANAEROBIC   Special Requests Blood Culture adequate volume  Final   Culture   Final    NO GROWTH 2 DAYS Performed at [facility], [address] [place], [place] [address]    Report Status PENDING  Incomplete  Culture, blood (Routine x 2)     Status: Abnormal (Preliminary result)   Collection Time: 02/28/22 12:43 PM   Specimen: Right Antecubital; Blood  Result Value Ref Range Status   Specimen Description   Final    RIGHT ANTECUBITAL BOTTLES DRAWN AEROBIC AND ANAEROBIC Performed at [facility], [address] [place], [place] [address]    Special Requests   Final    Blood Culture adequate volume Performed at [facility], [address], [place], [place] [address]    Culture  Setup Time   Final    GRAM NEGATIVE RODS Gram Stain Report Called to,Read Back By and Verified With: L HILTON RN 1250 [phone] K FORSYTH IN BOTH AEROBIC AND ANAEROBIC BOTTLES CRITICAL RESULT CALLED TO, READ BACK BY AND VERIFIED WITH: [place] '@1700'$  FH Performed at [facility], [address] [place], [place] [address]    Culture (A)  Final    ENTEROBACTER CLOACAE SUSCEPTIBILITIES TO FOLLOW Performed at [facility], [place] [address], [place], [place] [address]    Report Status PENDING  Incomplete  Blood Culture ID Panel (Reflexed)     Status: Abnormal   Collection Time: 02/28/22 12:43 PM  Result Value Ref Range Status   Enterococcus faecalis NOT DETECTED NOT DETECTED Final    Enterococcus Faecium NOT DETECTED NOT DETECTED Final   Listeria monocytogenes NOT DETECTED NOT DETECTED Final   Staphylococcus species NOT DETECTED NOT DETECTED Final   Staphylococcus aureus (BCID) NOT DETECTED NOT DETECTED Final   Staphylococcus epidermidis NOT DETECTED NOT DETECTED Final   Staphylococcus lugdunensis NOT DETECTED NOT DETECTED Final   Streptococcus species NOT DETECTED NOT DETECTED Final   Streptococcus agalactiae NOT DETECTED NOT DETECTED Final   Streptococcus pneumoniae NOT DETECTED NOT DETECTED Final   Streptococcus pyogenes NOT DETECTED NOT DETECTED Final   A.calcoaceticus-baumannii NOT DETECTED NOT DETECTED Final  Bacteroides fragilis NOT DETECTED NOT DETECTED Final   Enterobacterales DETECTED (A) NOT DETECTED Final    Comment: Enterobacterales represent a large order of gram negative bacteria, not a single organism. CRITICAL RESULT CALLED TO, READ BACK BY AND VERIFIED WITH: Cloyde Reams [id number] '@1700'$  FH    Enterobacter cloacae complex DETECTED (A) NOT DETECTED Final    Comment: CRITICAL RESULT CALLED TO, READ BACK BY AND VERIFIED WITH: PHARMD S. Rogene [place] [id number] '@1700'$  FH    Escherichia coli NOT DETECTED NOT DETECTED Final   Klebsiella aerogenes NOT DETECTED NOT DETECTED Final   Klebsiella oxytoca NOT DETECTED NOT DETECTED Final   Klebsiella pneumoniae NOT DETECTED NOT DETECTED Final   Proteus species NOT DETECTED NOT DETECTED Final   Salmonella species NOT DETECTED NOT DETECTED Final   Serratia marcescens NOT DETECTED NOT DETECTED Final   Haemophilus influenzae NOT DETECTED NOT DETECTED Final   Neisseria meningitidis NOT DETECTED NOT DETECTED Final   Pseudomonas aeruginosa NOT DETECTED NOT DETECTED Final   Stenotrophomonas maltophilia NOT DETECTED NOT DETECTED Final   Candida albicans NOT DETECTED NOT DETECTED Final   Candida auris NOT DETECTED NOT DETECTED Final   Candida glabrata NOT DETECTED NOT DETECTED Final   Candida krusei NOT DETECTED NOT DETECTED  Final   Candida parapsilosis NOT DETECTED NOT DETECTED Final   Candida tropicalis NOT DETECTED NOT DETECTED Final   Cryptococcus neoformans/gattii NOT DETECTED NOT DETECTED Final   CTX-M ESBL NOT DETECTED NOT DETECTED Final   Carbapenem resistance IMP NOT DETECTED NOT DETECTED Final   Carbapenem resistance KPC NOT DETECTED NOT DETECTED Final   Carbapenem resistance NDM NOT DETECTED NOT DETECTED Final   Carbapenem resist OXA 48 LIKE NOT DETECTED NOT DETECTED Final   Carbapenem resistance VIM NOT DETECTED NOT DETECTED Final    Comment: Performed at [facility], [address], [place], [place] [address]  MRSA Next Gen by PCR, Nasal     Status: None   Collection Time: 02/28/22 10:20 PM   Specimen: Nasal Mucosa; Nasal Swab  Result Value Ref Range Status   MRSA by PCR Next Gen NOT DETECTED NOT DETECTED Final    Comment: (NOTE) The GeneXpert MRSA Assay (FDA approved for NASAL specimens only), is one component of a comprehensive MRSA colonization surveillance program. It is not intended to diagnose MRSA infection nor to guide or monitor treatment for MRSA infections. Test performance is not FDA approved in patients less than 54 years old. Performed at [facility], [address], [place], [place] [address]   Gram stain     Status: None   Collection Time: 03/01/22 10:20 AM   Specimen: Pleura  Result Value Ref Range Status   Specimen Description PLEURAL  Final   Special Requests PLEURAL  Final   Gram Stain   Final    NO ORGANISMS SEEN WBC PRESENT,BOTH PMN AND MONONUCLEAR CYTOSPIN SMEAR Performed at [facility], [address], [place], [place] [address]    Report Status 03/01/2022 FINAL  Final  Culture, body fluid w Gram Stain-bottle     Status: None (Preliminary result)   Collection Time: 03/01/22 10:20 AM   Specimen: Pleura  Result Value Ref Range Status   Specimen Description PLEURAL  Final   Special Requests   Final    BOTTLES DRAWN AEROBIC AND ANAEROBIC Blood Culture  adequate volume   Culture   Final    NO GROWTH < 24 HOURS Performed at [facility], [address] [place], [place] [address]  Report Status PENDING  Incomplete     Scheduled Meds:  aspirin EC  324 mg Oral Daily   calcium acetate  667 mg Oral TID WC   Chlorhexidine Gluconate Cloth  6 each Topical Q0600   doxazosin  4 mg Oral Daily   feeding supplement (NEPRO CARB STEADY)  237 mL Oral BID BM   heparin  5,000 Units Subcutaneous Q8H   hydrALAZINE  25 mg Oral TID   insulin aspart  0-5 Units Subcutaneous QHS   insulin aspart  0-9 Units Subcutaneous TID WC   isosorbide dinitrate  30 mg Oral TID   metoprolol tartrate  75 mg Oral BID   sertraline  100 mg Oral Daily   Continuous Infusions:  amiodarone 30 mg/hr (03/02/22 1004)   ceFEPime (MAXIPIME) IV      Procedures/Studies: ECHOCARDIOGRAM COMPLETE  Result Date: 03/01/2022    ECHOCARDIOGRAM REPORT   Patient Name:   NASHAUN HILLMER Date of Exam: 03/01/2022 Medical Rec #:  [id number]        Height:       70.0 in Accession #:    [id number]       Weight:       181.4 lb Date of Birth:  10/05/1955        BSA:          2.003 m Patient Age:    75 years         BP:           131/55 mmHg Patient Gender: M                HR:           69 bpm. Exam Location:  [facility] Procedure: 2D Echo, Cardiac Doppler and Color Doppler Indications:    Dyspnea  History:        Patient has prior history of Echocardiogram examinations, most                 recent 07/25/2021. CHF and Cardiomyopathy, CAD, Prior CABG,                 Stroke and PAD, Arrythmias:Tachycardia, Signs/Symptoms:Shortness                 of Breath, Edema and Chest Pain; Risk Factors:Hypertension,                 Diabetes and Dyslipidemia.  Sonographer:    Wenda Low Referring Phys: [id number] [place]  1. Left ventricular ejection fraction, by estimation, is 40 to 45%. The left ventricle has mildly decreased function. The left ventricle demonstrates global hypokinesis. There  is moderate left ventricular hypertrophy. Left ventricular diastolic parameters are indeterminate.  2. Ventricular septum is flattened in systole consistent with RV pressure overload. . Right ventricular systolic function is moderately reduced. The right ventricular size is severely enlarged. There is mildly elevated pulmonary artery systolic pressure.  3. Left atrial size was severely dilated.  4. Catheter tip noted in RA. Right atrial size was severely dilated.  5. The mitral valve is normal in structure. No evidence of mitral valve regurgitation. No evidence of mitral stenosis.  6. The tricuspid valve is abnormal.  7. The aortic valve is tricuspid. There is mild calcification of the aortic valve. There is mild thickening of the aortic valve. Aortic valve regurgitation is not visualized. No aortic stenosis is present.  8. The inferior vena cava is dilated in size with >50% respiratory variability, suggesting right  atrial pressure of 8 mmHg. FINDINGS  Left Ventricle: Left ventricular ejection fraction, by estimation, is 40 to 45%. The left ventricle has mildly decreased function. The left ventricle demonstrates global hypokinesis. The left ventricular internal cavity size was normal in size. There is  moderate left ventricular hypertrophy. Left ventricular diastolic parameters are indeterminate. Right Ventricle: Ventricular septum is flattened in systole consistent with RV pressure overload. The right ventricular size is severely enlarged. Right vetricular wall thickness was not well visualized. Right ventricular systolic function is moderately reduced. There is mildly elevated pulmonary artery systolic pressure. The tricuspid regurgitant velocity is 2.91 m/s, and with an assumed right atrial pressure of 8 mmHg, the estimated right ventricular systolic pressure is 90.2 mmHg. Left Atrium: Left atrial size was severely dilated. Right Atrium: Catheter tip noted in RA. Right atrial size was severely dilated.  Pericardium: There is no evidence of pericardial effusion. Mitral Valve: The mitral valve is normal in structure. No evidence of mitral valve regurgitation. No evidence of mitral valve stenosis. MV peak gradient, 4.2 mmHg. The mean mitral valve gradient is 2.0 mmHg. Tricuspid Valve: The tricuspid valve is abnormal. Tricuspid valve regurgitation is mild . No evidence of tricuspid stenosis. Aortic Valve: The aortic valve is tricuspid. There is mild calcification of the aortic valve. There is mild thickening of the aortic valve. There is mild aortic valve annular calcification. Aortic valve regurgitation is not visualized. No aortic stenosis  is present. Aortic valve mean gradient measures 3.5 mmHg. Aortic valve peak gradient measures 7.2 mmHg. Aortic valve area, by VTI measures 2.94 cm. Pulmonic Valve: The pulmonic valve was not well visualized. Pulmonic valve regurgitation is not visualized. No evidence of pulmonic stenosis. Aorta: The aortic root is normal in size and structure. Venous: The inferior vena cava is dilated in size with greater than 50% respiratory variability, suggesting right atrial pressure of 8 mmHg. IAS/Shunts: No atrial level shunt detected by color flow Doppler.  LEFT VENTRICLE PLAX 2D LVIDd:         5.40 cm      Diastology LVIDs:         4.30 cm      LV e' medial:    5.87 cm/s LV PW:         1.40 cm      LV E/e' medial:  14.5 LV IVS:        1.20 cm      LV e' lateral:   11.70 cm/s LVOT diam:     2.10 cm      LV E/e' lateral: 7.3 LV SV:         81 LV SV Index:   41 LVOT Area:     3.46 cm  LV Volumes (MOD) LV vol d, MOD A2C: 118.0 ml LV vol d, MOD A4C: 98.3 ml LV vol s, MOD A2C: 85.2 ml LV vol s, MOD A4C: 48.6 ml LV SV MOD A2C:     32.8 ml LV SV MOD A4C:     98.3 ml LV SV MOD BP:      39.3 ml RIGHT VENTRICLE RV Basal diam:  4.80 cm RV Mid diam:    3.50 cm RV S prime:     10.10 cm/s TAPSE (M-mode): 1.7 cm LEFT ATRIUM              Index        RIGHT ATRIUM           Index LA diam:  5.80 cm   2.90 cm/m   RA Area:     26.40 cm LA Vol (A2C):   140.0 ml 69.91 ml/m  RA Volume:   84.40 ml  42.14 ml/m LA Vol (A4C):   91.4 ml  45.64 ml/m LA Biplane Vol: 114.0 ml 56.92 ml/m  AORTIC VALVE                    PULMONIC VALVE AV Area (Vmax):    2.66 cm     PV Vmax:       0.87 m/s AV Area (Vmean):   2.71 cm     PV Peak grad:  3.0 mmHg AV Area (VTI):     2.94 cm AV Vmax:           134.00 cm/s AV Vmean:          88.233 cm/s AV VTI:            0.277 m AV Peak Grad:      7.2 mmHg AV Mean Grad:      3.5 mmHg LVOT Vmax:         103.00 cm/s LVOT Vmean:        69.000 cm/s LVOT VTI:          0.235 m LVOT/AV VTI ratio: 0.85  AORTA Ao Root diam: 3.90 cm Ao Asc diam:  3.40 cm MITRAL VALVE               TRICUSPID VALVE MV Area (PHT): 3.05 cm    TR Peak grad:   33.9 mmHg MV Area VTI:   2.56 cm    TR Vmax:        291.00 cm/s MV Peak grad:  4.2 mmHg MV Mean grad:  2.0 mmHg    SHUNTS MV Vmax:       1.02 m/s    Systemic VTI:  0.24 m MV Vmean:      61.2 cm/s   Systemic Diam: 2.10 cm MV Decel Time: 249 msec MV E velocity: 85.30 cm/s MV A velocity: 75.80 cm/s MV E/A ratio:  1.13 Carlyle Dolly MD Electronically signed by Carlyle Dolly MD Signature Date/Time: 03/01/2022/1:08:36 PM    Final    DG Chest 1 View  Result Date: 03/01/2022 CLINICAL DATA:  Status post right thoracentesis. EXAM: CHEST  1 VIEW COMPARISON:  CT chest and chest x-ray from yesterday. FINDINGS: Unchanged tunneled right internal jugular dialysis catheter. Unchanged mild cardiomegaly status post CABG. Decreased now trace small right pleural effusion. No pneumothorax. Improved aeration of the right lower lobe. No acute osseous abnormality. IMPRESSION: 1. Decreased right pleural effusion status post thoracentesis. No pneumothorax. Electronically Signed   By: Titus Dubin M.D.   On: 03/01/2022 11:26   US THORACENTESIS ASP PLEURAL SPACE W/IMG GUIDE  Result Date: 03/01/2022 INDICATION: Right pleural effusion. EXAM: ULTRASOUND GUIDED RIGHT THORACENTESIS  MEDICATIONS: None. COMPLICATIONS: None immediate. PROCEDURE: An ultrasound guided thoracentesis was thoroughly discussed with the patient and questions answered. The benefits, risks, alternatives and complications were also discussed. The patient understands and wishes to proceed with the procedure. Written consent was obtained. Ultrasound was performed to localize and mark an adequate pocket of fluid in the right chest. The area was then prepped and draped in the normal sterile fashion. 1% Lidocaine was used for local anesthesia. Under ultrasound guidance a 6 Fr Safe-T-Centesis catheter was introduced. Thoracentesis was performed. The catheter was removed and a dressing applied. FINDINGS: A total of approximately 1.3 L of dark  yellow fluid was removed. Samples were sent to the laboratory as requested by the clinical team. IMPRESSION: Successful ultrasound guided right thoracentesis yielding 1.3 L of pleural fluid. Electronically Signed   By: Titus Dubin M.D.   On: 03/01/2022 11:23   CT Angio Chest Pulmonary Embolism (PE) W or WO Contrast  Result Date: 02/28/2022 CLINICAL DATA:  Acute respiratory failure, cough, shortness of breath, hypoxia EXAM: CT ANGIOGRAPHY CHEST WITH CONTRAST TECHNIQUE: Multidetector CT imaging of the chest was performed using the standard protocol during bolus administration of intravenous contrast. Multiplanar CT image reconstructions and MIPs were obtained to evaluate the vascular anatomy. RADIATION DOSE REDUCTION: This exam was performed according to the departmental dose-optimization program which includes automated exposure control, adjustment of the mA and/or kV according to patient size and/or use of iterative reconstruction technique. CONTRAST:  45m OMNIPAQUE IOHEXOL 350 MG/ML SOLN COMPARISON:  01/23/2022 FINDINGS: Cardiovascular: There is homogeneous enhancement in thoracic aorta. Coronary artery calcifications are seen. There is previous coronary artery bypass surgery. There  is ectasia of main pulmonary artery measuring 4.1 cm suggesting pulmonary arterial hypertension. There are no intraluminal filling defects in central pulmonary artery branches. Evaluation of small peripheral pulmonary artery branches is limited by motion artifacts and atelectasis. Heart is enlarged in size. There is reflux of contrast into hepatic veins suggesting tricuspid incompetence. Tip of right IJ central venous catheter is seen in the region of right atrium. Mediastinum/Nodes: No new significant lymphadenopathy seen. Lungs/Pleura: There is large right pleural effusion with significant interval increase. Density measurements in the right pleural effusion are less than 20 Hounsfield units. There is minimal left pleural effusion. There is compression atelectasis in right lower lobe and to a lesser extent in right middle lobe and right upper lobe. No focal infiltrates are seen in left lung. There is no pneumothorax. Upper Abdomen: There is fatty infiltration in the liver. Spleen is enlarged measuring 15.4 cm in AP diameter. Multiple diverticula are seen in visualized portions of colon. Musculoskeletal: Deformity in the right tenth rib may suggest old ununited fracture. Review of the MIP images confirms the above findings. IMPRESSION: There is no evidence of central pulmonary artery embolism. Evaluation of small peripheral pulmonary artery branches is limited by motion artifacts. There is ectasia of main pulmonary artery suggesting pulmonary arterial hypertension. Coronary artery disease. Previous coronary artery bypass surgery. Cardiomegaly. Tricuspid incompetence. There is large right pleural effusion with significant interval increase in size. Compression atelectasis is seen in right lung, especially in right lower lobe. There is minimal left pleural effusion. Fatty liver.  Enlarged spleen.  Diverticulosis of colon. Other findings as described in the body of the report. Electronically Signed   By: PElmer PickerM.D.   On: 02/28/2022 18:07   DG Chest Port 1 View  Result Date: 02/28/2022 CLINICAL DATA:  Chest pain and dyspnea EXAM: PORTABLE CHEST 1 VIEW COMPARISON:  01/29/2022 chest radiograph. FINDINGS: Intact sternotomy wires. Pacer pad overlies the left chest. Right internal jugular central venous catheter terminates over the cavoatrial junction. Stable cardiomediastinal silhouette with mild cardiomegaly. No pneumothorax. Small right pleural effusion is slightly increased. No left pleural effusion. Slightly low lung volumes, similar. Cephalization of the pulmonary vasculature without overt pulmonary edema. Mild hazy right lung base opacity is mildly increased. IMPRESSION: 1. Stable mild cardiomegaly without overt pulmonary edema. 2. Small right pleural effusion, slightly increased. 3. Mildly increased hazy right lung base opacity, favor atelectasis. Electronically Signed   By: JIlona SorrelM.D.   On: 02/28/2022 13:40  NM PET Image Restage (PS) Skull Base to Thigh (F-18 FDG)  Result Date: 02/11/2022 CLINICAL DATA:  Subsequent treatment strategy for esophageal carcinoma. EXAM: NUCLEAR MEDICINE PET SKULL BASE TO THIGH TECHNIQUE: 9.91 mCi F-18 FDG was injected intravenously. Full-ring PET imaging was performed from the skull base to thigh after the radiotracer. CT data was obtained and used for attenuation correction and anatomic localization. Fasting blood glucose: 228 mg/dl COMPARISON:  07/15/2021 FINDINGS: Mediastinal blood pool activity: SUV max 2.22 Liver activity: SUV max NA NECK: No hypermetabolic lymph nodes in the neck. Incidental CT findings: None CHEST: No tracer avid supraclavicular, axillary, mediastinal, or hilar lymph nodes. No abnormal hypermetabolism associated with the esophagus. No tracer avid pulmonary nodules. Small to moderate right pleural effusion is again noted. Incidental CT findings: Cardiac enlargement. Aortic atherosclerosis. Status post CABG. ABDOMEN/PELVIS: No abnormal  hypermetabolic activity within the liver, pancreas, adrenal glands, or spleen. No hypermetabolic lymph nodes in the abdomen or pelvis. Incidental CT findings: Bilateral fluid density kidney cysts are again noted compatible with simple cysts. No follow-up imaging recommended. Similar appearance of nonspecific bilateral perinephric fat stranding. Decrease abdominal ascites. Sigmoid diverticulosis without signs of acute diverticulitis. SKELETON: No focal hypermetabolic activity to suggest skeletal metastasis. Incidental CT findings: Subacute to chronic scratch set healing right posterior rib fractures are again noted. Chronic right anterior rib fracture deformities are also again noted. IMPRESSION: 1. There are no signs to suggest residual or recurrent FDG avid tumor within the esophagus. No FDG avid adenopathy or signs of solid organ metastasis. 2. Persistent right pleural effusion.  Decrease in ascites. 3. Cardiac enlargement. Status post CABG. Aortic Atherosclerosis (ICD10-I70.0). Electronically Signed   By: Kerby Moors M.D.   On: 02/11/2022 07:47   CARDIAC CATHETERIZATION  Result Date: 02/01/2022   Mid LM to Dist LM lesion is 25% stenosed.   Ost LAD to Prox LAD lesion is 50% stenosed.   Ost Cx to Prox Cx lesion is 90% stenosed.   Dist RCA-1 lesion is 90% stenosed.   Mid RCA lesion is 75% stenosed.   1st Diag lesion is 80% stenosed.   2nd Diag lesion is 75% stenosed.   1st Mrg lesion is 80% stenosed.   2nd Mrg lesion is 80% stenosed.   Mid LAD lesion is 100% stenosed.   RPAV lesion is 80% stenosed.   Dist RCA-2 lesion is 100% stenosed.   SVG graft was visualized by angiography and is normal in caliber.   SVG graft was visualized by angiography and is normal in caliber.   LIMA graft was visualized by angiography and is large.   SVG graft was visualized by angiography and is normal in caliber.   The graft exhibits no disease.   The graft exhibits no disease.   The graft exhibits no disease.   The graft exhibits  no disease.   LV end diastolic pressure is mildly elevated. Severe 3 vessel obstructive CAD Patent LIMA to the LAD Patent SVG to large second diagonal Patent SVG to OM Patent SVG to PL branch of the RCA Elevated LVEDP 25 mm Hg Plan: continue medical management.    Orson Eva, DO  [facility]  If 7PM-7AM, please contact night-coverage [email or web address] Password [facility] 03/02/2022, 3:44 PM   LOS: 2 days

## 2022-03-03 ENCOUNTER — Inpatient Hospital Stay (HOSPITAL_COMMUNITY): Payer: Medicare HMO

## 2022-03-03 DIAGNOSIS — J90 Pleural effusion, not elsewhere classified: Secondary | ICD-10-CM | POA: Diagnosis not present

## 2022-03-03 DIAGNOSIS — C159 Malignant neoplasm of esophagus, unspecified: Secondary | ICD-10-CM

## 2022-03-03 DIAGNOSIS — R042 Hemoptysis: Secondary | ICD-10-CM | POA: Diagnosis not present

## 2022-03-03 DIAGNOSIS — G9341 Metabolic encephalopathy: Secondary | ICD-10-CM

## 2022-03-03 DIAGNOSIS — J9601 Acute respiratory failure with hypoxia: Secondary | ICD-10-CM | POA: Diagnosis not present

## 2022-03-03 DIAGNOSIS — R7881 Bacteremia: Secondary | ICD-10-CM | POA: Diagnosis not present

## 2022-03-03 HISTORY — DX: Metabolic encephalopathy: G93.41

## 2022-03-03 LAB — BLOOD GAS, VENOUS
Acid-Base Excess: 4.1 mmol/L — ABNORMAL HIGH (ref 0.0–2.0)
Bicarbonate: 27.4 mmol/L (ref 20.0–28.0)
Drawn by: 6000
O2 Saturation: 91.8 %
Patient temperature: 36.5
pCO2, Ven: 35 mmHg — ABNORMAL LOW (ref 44–60)
pH, Ven: 7.5 — ABNORMAL HIGH (ref 7.25–7.43)
pO2, Ven: 59 mmHg — ABNORMAL HIGH (ref 32–45)

## 2022-03-03 LAB — CBC
HCT: 29.4 % — ABNORMAL LOW (ref 39.0–52.0)
Hemoglobin: 9.4 g/dL — ABNORMAL LOW (ref 13.0–17.0)
MCH: 27.2 pg (ref 26.0–34.0)
MCHC: 32 g/dL (ref 30.0–36.0)
MCV: 85 fL (ref 80.0–100.0)
Platelets: 154 10*3/uL (ref 150–400)
RBC: 3.46 MIL/uL — ABNORMAL LOW (ref 4.22–5.81)
RDW: 17.6 % — ABNORMAL HIGH (ref 11.5–15.5)
WBC: 5.7 10*3/uL (ref 4.0–10.5)
nRBC: 0 % (ref 0.0–0.2)

## 2022-03-03 LAB — CULTURE, BLOOD (ROUTINE X 2): Special Requests: ADEQUATE

## 2022-03-03 LAB — RENAL FUNCTION PANEL
Albumin: 3.3 g/dL — ABNORMAL LOW (ref 3.5–5.0)
Anion gap: 11 (ref 5–15)
BUN: 43 mg/dL — ABNORMAL HIGH (ref 8–23)
CO2: 24 mmol/L (ref 22–32)
Calcium: 8.2 mg/dL — ABNORMAL LOW (ref 8.9–10.3)
Chloride: 100 mmol/L (ref 98–111)
Creatinine, Ser: 4.41 mg/dL — ABNORMAL HIGH (ref 0.61–1.24)
GFR, Estimated: 14 mL/min — ABNORMAL LOW (ref >60)
Glucose, Bld: 227 mg/dL — ABNORMAL HIGH (ref 70–99)
Phosphorus: 2.6 mg/dL (ref 2.5–4.6)
Potassium: 3.5 mmol/L (ref 3.5–5.1)
Sodium: 135 mmol/L (ref 135–145)

## 2022-03-03 LAB — GLUCOSE, CAPILLARY
Glucose-Capillary: 188 mg/dL — ABNORMAL HIGH (ref 70–99)
Glucose-Capillary: 147 mg/dL — ABNORMAL HIGH (ref 70–99)
Glucose-Capillary: 151 mg/dL — ABNORMAL HIGH (ref 70–99)
Glucose-Capillary: 198 mg/dL — ABNORMAL HIGH (ref 70–99)
Glucose-Capillary: 168 mg/dL — ABNORMAL HIGH (ref 70–99)

## 2022-03-03 LAB — FOLATE: Folate: 14.1 ng/mL (ref >5.9)

## 2022-03-03 LAB — T4, FREE: Free T4: 0.88 ng/dL (ref 0.61–1.12)

## 2022-03-03 LAB — HEPATIC FUNCTION PANEL
ALT: 21 U/L (ref 0–44)
AST: 15 U/L (ref 15–41)
Albumin: 3.1 g/dL — ABNORMAL LOW (ref 3.5–5.0)
Alkaline Phosphatase: 93 U/L (ref 38–126)
Bilirubin, Direct: 0.1 mg/dL (ref 0.0–0.2)
Indirect Bilirubin: 0.6 mg/dL (ref 0.3–0.9)
Total Bilirubin: 0.7 mg/dL (ref 0.3–1.2)
Total Protein: 5.9 g/dL — ABNORMAL LOW (ref 6.5–8.1)

## 2022-03-03 LAB — TSH: TSH: 2.367 u[IU]/mL (ref 0.350–4.500)

## 2022-03-03 LAB — HEMOGLOBIN AND HEMATOCRIT, BLOOD
HCT: 28.1 % — ABNORMAL LOW (ref 39.0–52.0)
Hemoglobin: 8.8 g/dL — ABNORMAL LOW (ref 13.0–17.0)

## 2022-03-03 LAB — AMMONIA: Ammonia: 32 umol/L (ref 9–35)

## 2022-03-03 LAB — PROCALCITONIN: Procalcitonin: 37.7 ng/mL

## 2022-03-03 LAB — VITAMIN B12: Vitamin B-12: 564 pg/mL (ref 180–914)

## 2022-03-03 LAB — HEPATITIS B SURFACE ANTIBODY, QUANTITATIVE: Hep B S AB Quant (Post): <3.1 m[IU]/mL — ABNORMAL LOW (ref >9.9)

## 2022-03-03 MED ORDER — HYDROXYZINE HCL 10 MG PO TABS
10.0000 mg | ORAL_TABLET | Freq: Once | ORAL | Status: AC
  Administered 2022-03-03: 10 mg via ORAL
  Filled 2022-03-03: qty 1

## 2022-03-03 MED ORDER — HYDRALAZINE HCL 20 MG/ML IJ SOLN
10.0000 mg | Freq: Four times a day (QID) | INTRAMUSCULAR | Status: DC | PRN
  Administered 2022-03-03 – 2022-03-07 (×2): 10 mg via INTRAVENOUS
  Filled 2022-03-03 (×2): qty 1

## 2022-03-03 MED ORDER — SODIUM CHLORIDE 0.9 % IV SOLN
1.0000 g | Freq: Two times a day (BID) | INTRAVENOUS | Status: DC
  Administered 2022-03-03 (×2): 1 g via INTRAVENOUS
  Filled 2022-03-03 (×3): qty 20

## 2022-03-03 MED ORDER — POTASSIUM CHLORIDE 10 MEQ/100ML IV SOLN
10.0000 meq | INTRAVENOUS | Status: AC
  Administered 2022-03-03 (×3): 10 meq via INTRAVENOUS
  Filled 2022-03-03 (×3): qty 100

## 2022-03-03 MED ORDER — POTASSIUM CHLORIDE CRYS ER 20 MEQ PO TBCR
40.0000 meq | EXTENDED_RELEASE_TABLET | Freq: Once | ORAL | Status: DC
  Filled 2022-03-03: qty 2

## 2022-03-03 MED ORDER — SODIUM CHLORIDE 0.9 % IV SOLN: 1.0000 g | INTRAVENOUS | Status: DC

## 2022-03-03 MED ORDER — PROCHLORPERAZINE EDISYLATE 10 MG/2ML IJ SOLN
10.0000 mg | Freq: Four times a day (QID) | INTRAMUSCULAR | Status: DC | PRN
  Administered 2022-03-03 (×2): 10 mg via INTRAVENOUS
  Filled 2022-03-03 (×2): qty 2

## 2022-03-03 NOTE — Assessment & Plan Note (Addendum)
03/03/2022 AM--patient is somnolent but arouses to voice and answering questions appropriately -CT brain--neg -Ammonia--32 -CBG 151 -Check VBG 7.5/35/59/27 RA B12--564 -Discontinue any hypnotic medications -May be related to cefepime>>switch to [facility] -mental status now improved

## 2022-03-03 NOTE — Progress Notes (Signed)
Patient vomiting. Noted to be more drowsy than earlier. Awakens to voice, previously alert and oriented. Continues to answer orientation questions appropriately but required to be woken up several times while interacting with patient. Flushed IV to give compazine for nausea and vomiting and IV blew and infiltrated. Two new IVs obtained for this patient. Amio infusing again and nausea meds provided. While placing new IVs, patient began coughing up mucous and bright red blood. Dr. Carles Collet notified at 1215 and at patient bedside. Patient informed MD that he was having abdominal pain. Patient taken to head CT and Abd/Pelvis CT. After scan completed patient stated his abdomen was no longer hurting. Bed bath and linen change completed. Resting comfortably at this time.

## 2022-03-03 NOTE — Progress Notes (Addendum)
PROGRESS NOTE  ALMIR BOTTS AJG:[id number] DOB: [birth year] DOA: 02/28/2022 PCP: [name], MD  Brief History:  66 y.o. male with medical history significant for CABG, ESRD, cardiomyopathy with systolic and diastolic CHF, stroke, hypertension, diabetes mellitus. Patient was brought to the ED from dialysis center with reports of worsening of his difficulty breathing.  At the time of my evaluation, patient is on BiPAP. Patient was having dialysis today and was 1 and 54 minutes into session when it had to be stopped.  He was having significant worsening of his breathing, with use of accessory muscle, was placed on 4 L nasal cannula, and BiPAP here. Patient reports has been having difficulty breathing over the past 2 days.  He reported cough productive of whitish phlegm.  On arrival to the ED there was some wheezing.   Recent hospitalization 8/19 to 8/25 for chest pain, transferred to [facility], due to history of previous CABG.  Underwent cardiac cath 8/22 which showed patent CABG.   ED Course: Temperature 98.9.  Initial heart rate 103 improved to 70s, respiratory rate initially up to 30 improved after BiPAP, blood pressure 120s to 170s.  O2 sats 90% on 4 L nasal cannula, subsequently switched to BiPAP due to tachycardia, tachypnea, accessory muscle use and increased work of breathing. Chest x-ray with mild cardiomegaly without overt pulmonary edema, small right pleural effusion slightly increased, hazy right lung base opacity favor atelectasis. VBG with pH of 7.3, PCO2 of 43.  Lactic acid 3.5.  Troponin 51.   IV ceftriaxone and azithromycin started. Initial concern for flash pulmonary edema, hence nitroglycerin drip initially started and later discontinued with improvement in respiratory status. However, the patient was noted to have gram-negative bacteremia, and he was started on cefepime empirically.  On the morning of 03/03/2022, the patient was a little more somnolent and  confused.  However, he woke up to voice and was able to answer questions appropriately when he was awake.  Work-up was undertaken to clarify etiology of his altered mental status.    Assessment and Plan: * Acute respiratory failure with hypoxia (HCC) Difficulty breathing over the past 2 days PTA -placed on BiPAP due to increased work of breathing.   -Respiratory status has improved after thora (1.3L removed).  Quit smoking cigarettes about 33 years ago. . -Started on BiPAP and nitroglycerin drip which was discontinued after improvement in respiratory status. -Hold off on further antibiotics at this time -Obtain CTA chest, I talked to nephrologist Dr. [name], okay to administer IV contrast at this time -Lactic acid 3.5 > 1.9 resolved without intervention, likely secondary to hypoxia, doubt infection. -Addendum-no PE, shows large right pleural effusion increased in size from prior.  Likely etiology for his hypoxia.  -ordered ultrasound-guided thoracentesis  DM type 2 causing vascular disease (HCC) A1c 7.7.  Glucose 176. - continue SSI   Essential hypertension, benign Systolic 355H to 741U. -Resume doxazosin, Imdur, metoprolol  -hydralazine restarted by cardiology team 3/84  Acute metabolic encephalopathy 5/36/4680 AM--patient is somnolent but arouses to voice and answering questions appropriately -CT brain -Ammonia -CBG 151 -Check VBG -Discontinue any hypnotic medications -May be related to cefepime>>switch to merrem  Acute on chronic combined systolic and diastolic CHF (congestive heart failure) ([place]) 07/25/21 Echo--EF 40-45%, no WMA, mod RV dysfx, PASP 56.5 - BNP >4500, large right effusion on CT - transiently on NG gtt in setting of HTN now off.  - fluid management per HD, defer  to neprhology 03/01/22 Echo--EF 40-45%, global HK, RV overload -03/02/2022 UF 1.8L  SVT (supraventricular tachycardia) (HCC) --Pt found unresponsive in sustained SVT on 9/18, resolved after vagal  maneuvers -- appreciate cardiology -continued to have SVT 9/20 evening, although nonsustained -switched coreg to metoprolol -started amiodarone drip>>continue  -continue metoprolol 75 mg bid  Pleural effusion on right -- likely major contributor to his dyspneic symptoms -- 9/19 thora>>1.3L--appears exudative by Light's criteria -cytology negative  Esophageal adenocarcinoma (HCC) Follows GI at [facility], Dr. [name] --considered poor candidate for esophagectomy. After multidisciplinary discussion the plan was to continue with endoscopic treatments, and continue to follow-up with oncology for surveillance and any signs of progression of disease -status post EMR, cryotherapy, and most recently RFA in April 2023 -02/10/22 PET scan--no local recurrence   DNR (do not resuscitate) --continue DNR order while in hospital   gram neg rod Bacteremia -E. [place] -Due to altered mental status, change antibiotics to ceftazidime  ESRD on dialysis [facility]) Started dialysis within the past month.  Schedule Monday Wednesday Friday.  HD session terminated today due to sudden worsening of his dyspnea.  I talked with Dr. Arty Baumgartner today, okay with IV contrast for now. -nephrology consulted to manage inpatient HD -03/02/22 UF 1.8L   Obstructive sleep apnea BIPAP QHS while hospitalized.  S/P CABG (coronary artery bypass graft) Recent cardiac cath 8/22, showed patent grafts, severe three-vessel native CAD.Marland Kitchen  History of CABG 2020.  Troponin today 51 > 52, not consistent with ACS -Resume aspirin, metoprolol, Imdur.   -No chest pain -03/01/2022 echo EF 40-45%, global HK, mild TR, RV overload         Status is: Inpatient Remains inpatient appropriate because:      Family Communication:   significant other at bedside 9/21  Consultants:  renal, cardiology  Code Status:  FULL  DVT Prophylaxis:  [place] Heparin    Procedures: As Listed in Progress Note Above  Antibiotics: Cefepime  9/19>>9/21 Merrem 9/21>>      Subjective: Patient is somnolent but arouses to voice.  He subsequently answers to questions appropriately.  He denies any headache, chest pain, shortness breath.  He had 1 episode of emesis.  There is no hematochezia or melena.  Objective: Vitals:   03/03/22 0800 03/03/22 0900 03/03/22 1000 03/03/22 1100  BP: (!) 146/65 (!) 147/60 (!) 132/54   Pulse: 82 73 72   Resp: (!) '21 17 18   '$ Temp:    98.4 F (36.9 C)  TempSrc:    Oral  SpO2: 97% 96% 95%   Weight:      Height:        Intake/Output Summary (Last 24 hours) at 03/03/2022 1403 Last data filed at 03/02/2022 2000 Gross per 24 hour  Intake 112.97 ml  Output 1800 ml  Net -1687.03 ml   Weight change: -3.3 kg Exam:  General:  Pt is somnolent, but arouses easily, follows commands appropriately, not in acute distress HEENT: No icterus, No thrush, No neck mass, [place]/AT Cardiovascular: RRR, S1/S2, no rubs, no gallops Respiratory: Bibasilar rales.  No wheezing.  Good air movement Abdomen: Soft/+BS, LLQ, RLQ tender, non distended, no guarding Extremities: No edema, No lymphangitis, No petechiae, No rashes, no synovitis Neuro:  CN II-XII intact, strength 4/5 in RUE, RLE, strength 4/5 LUE, LLE; sensation intact bilateral; no dysmetria; babinski equivocal    Data Reviewed: I have personally reviewed following labs and imaging studies Basic Metabolic Panel: Recent Labs  Lab 03/01/22 0040 03/01/22 0437 03/01/22 2123 03/02/22 0238 03/03/22 1610  NA 135 137 134* 135 135  K 4.0 4.1 3.3* 3.7 3.5  CL 100 102 98 99 100  CO2 21* '23 24 23 24  '$ GLUCOSE 195* 183* 180* 194* 227*  BUN 56* 59* 49* 51* 43*  CREATININE 5.22* 5.38* 4.41* 4.76* 4.41*  CALCIUM 8.1* 8.3* 8.0* 8.1* 8.2*  MG 2.1  --  1.8 1.9  --   PHOS 6.1*  --   --  4.3 2.6   Liver Function Tests: Recent Labs  Lab 02/28/22 1238 03/02/22 0238 03/03/22 0612  AST 33  --   --   ALT 32  --   --   ALKPHOS 135*  --   --   BILITOT 1.0  --   --    PROT 7.6  --   --   ALBUMIN 4.0 3.3* 3.3*   No results for input(s): "LIPASE", "AMYLASE" in the last 168 hours. No results for input(s): "AMMONIA" in the last 168 hours. Coagulation Profile: Recent Labs  Lab 02/28/22 1238  INR 1.4*   CBC: Recent Labs  Lab 02/28/22 1238 03/01/22 0437 03/02/22 0238 03/03/22 0612  WBC 8.8 7.0 6.3 5.7  NEUTROABS 8.2*  --   --   --   HGB 10.7* 9.2* 9.0* 9.4*  HCT 35.0* 29.2* 28.5* 29.4*  MCV 87.5 85.6 85.1 85.0  PLT 198 157 148* 154   Cardiac Enzymes: No results for input(s): "CKTOTAL", "CKMB", "CKMBINDEX", "TROPONINI" in the last 168 hours. BNP: Invalid input(s): "POCBNP" CBG: Recent Labs  Lab 03/02/22 1545 03/02/22 1927 03/03/22 0725 03/03/22 1117 03/03/22 1214  GLUCAP 185* 155* 188* 147* 151*   HbA1C: No results for input(s): "HGBA1C" in the last 72 hours. Urine analysis:    Component Value Date/Time   COLORURINE YELLOW 02/28/2022 1414   APPEARANCEUR CLEAR 02/28/2022 1414   LABSPEC 1.014 02/28/2022 1414   PHURINE 5.0 02/28/2022 1414   GLUCOSEU >=500 (A) 02/28/2022 1414   HGBUR NEGATIVE 02/28/2022 1414   BILIRUBINUR NEGATIVE 02/28/2022 1414   KETONESUR NEGATIVE 02/28/2022 1414   PROTEINUR >=300 (A) 02/28/2022 1414   NITRITE NEGATIVE 02/28/2022 1414   LEUKOCYTESUR NEGATIVE 02/28/2022 1414   Sepsis Labs: '@LABRCNTIP'$ (procalcitonin:4,lacticidven:4) ) Recent Results (from the past 240 hour(s))  Culture, blood (Routine x 2)     Status: None (Preliminary result)   Collection Time: 02/28/22 12:38 PM   Specimen: BLOOD RIGHT FOREARM  Result Value Ref Range Status   Specimen Description   Final    BLOOD RIGHT FOREARM BOTTLES DRAWN AEROBIC AND ANAEROBIC   Special Requests Blood Culture adequate volume  Final   Culture   Final    NO GROWTH 3 DAYS Performed at [facility], [address] [place], [place] [address]    Report Status PENDING  Incomplete  Culture, blood (Routine x 2)     Status: Abnormal   Collection Time:  02/28/22 12:43 PM   Specimen: Right Antecubital; Blood  Result Value Ref Range Status   Specimen Description   Final    RIGHT ANTECUBITAL BOTTLES DRAWN AEROBIC AND ANAEROBIC Performed at [facility], [address] [place], [place] [address]    Special Requests   Final    Blood Culture adequate volume Performed at [facility], [address], [place], [place] [address]    Culture  Setup Time   Final    GRAM NEGATIVE RODS Gram Stain Report Called to,Read Back By and Verified With: L HILTON RN 1250 [phone] K FORSYTH IN BOTH AEROBIC AND ANAEROBIC BOTTLES CRITICAL RESULT CALLED  TO, READ BACK BY AND VERIFIED WITH: Cloyde Reams [id number] '@1700'$  FH Performed at [facility], [address], [place], [place] [address]    Culture ENTEROBACTER CLOACAE (A)  Final   Report Status 03/03/2022 FINAL  Final   Organism ID, Bacteria ENTEROBACTER CLOACAE  Final      Susceptibility   Enterobacter cloacae - MIC*    CEFAZOLIN >=64 RESISTANT Resistant     CEFEPIME <=0.12 SENSITIVE Sensitive     CEFTAZIDIME <=1 SENSITIVE Sensitive     CIPROFLOXACIN <=0.25 SENSITIVE Sensitive     GENTAMICIN <=1 SENSITIVE Sensitive     IMIPENEM 0.5 SENSITIVE Sensitive     TRIMETH/SULFA <=20 SENSITIVE Sensitive     PIP/TAZO <=4 SENSITIVE Sensitive     * ENTEROBACTER CLOACAE  Blood Culture ID Panel (Reflexed)     Status: Abnormal   Collection Time: 02/28/22 12:43 PM  Result Value Ref Range Status   Enterococcus faecalis NOT DETECTED NOT DETECTED Final   Enterococcus Faecium NOT DETECTED NOT DETECTED Final   Listeria monocytogenes NOT DETECTED NOT DETECTED Final   Staphylococcus species NOT DETECTED NOT DETECTED Final   Staphylococcus aureus (BCID) NOT DETECTED NOT DETECTED Final   Staphylococcus epidermidis NOT DETECTED NOT DETECTED Final   Staphylococcus lugdunensis NOT DETECTED NOT DETECTED Final   Streptococcus species NOT DETECTED NOT DETECTED Final   Streptococcus agalactiae NOT DETECTED NOT DETECTED Final    Streptococcus pneumoniae NOT DETECTED NOT DETECTED Final   Streptococcus pyogenes NOT DETECTED NOT DETECTED Final   A.calcoaceticus-baumannii NOT DETECTED NOT DETECTED Final   Bacteroides fragilis NOT DETECTED NOT DETECTED Final   Enterobacterales DETECTED (A) NOT DETECTED Final    Comment: Enterobacterales represent a large order of gram negative bacteria, not a single organism. CRITICAL RESULT CALLED TO, READ BACK BY AND VERIFIED WITH: PHARMD S. HURTH [id number] '@1700'$  FH    Enterobacter cloacae complex DETECTED (A) NOT DETECTED Final    Comment: CRITICAL RESULT CALLED TO, READ BACK BY AND VERIFIED WITH: PHARMD S. Rogene [place] [id number] '@1700'$  FH    Escherichia coli NOT DETECTED NOT DETECTED Final   Klebsiella aerogenes NOT DETECTED NOT DETECTED Final   Klebsiella oxytoca NOT DETECTED NOT DETECTED Final   Klebsiella pneumoniae NOT DETECTED NOT DETECTED Final   Proteus species NOT DETECTED NOT DETECTED Final   Salmonella species NOT DETECTED NOT DETECTED Final   Serratia marcescens NOT DETECTED NOT DETECTED Final   Haemophilus influenzae NOT DETECTED NOT DETECTED Final   Neisseria meningitidis NOT DETECTED NOT DETECTED Final   Pseudomonas aeruginosa NOT DETECTED NOT DETECTED Final   Stenotrophomonas maltophilia NOT DETECTED NOT DETECTED Final   Candida albicans NOT DETECTED NOT DETECTED Final   Candida auris NOT DETECTED NOT DETECTED Final   Candida glabrata NOT DETECTED NOT DETECTED Final   Candida krusei NOT DETECTED NOT DETECTED Final   Candida parapsilosis NOT DETECTED NOT DETECTED Final   Candida tropicalis NOT DETECTED NOT DETECTED Final   Cryptococcus neoformans/gattii NOT DETECTED NOT DETECTED Final   CTX-M ESBL NOT DETECTED NOT DETECTED Final   Carbapenem resistance IMP NOT DETECTED NOT DETECTED Final   Carbapenem resistance KPC NOT DETECTED NOT DETECTED Final   Carbapenem resistance NDM NOT DETECTED NOT DETECTED Final   Carbapenem resist OXA 48 LIKE NOT DETECTED NOT DETECTED Final    Carbapenem resistance VIM NOT DETECTED NOT DETECTED Final    Comment: Performed at [facility], [address] [place], [place] [address]  MRSA Next Gen by PCR, Nasal  Status: None   Collection Time: 02/28/22 10:20 PM   Specimen: Nasal Mucosa; Nasal Swab  Result Value Ref Range Status   MRSA by PCR Next Gen NOT DETECTED NOT DETECTED Final    Comment: (NOTE) The GeneXpert MRSA Assay (FDA approved for NASAL specimens only), is one component of a comprehensive MRSA colonization surveillance program. It is not intended to diagnose MRSA infection nor to guide or monitor treatment for MRSA infections. Test performance is not FDA approved in patients less than 77 years old. Performed at [facility], [address], [place], [place] [address]   Gram stain     Status: None   Collection Time: 03/01/22 10:20 AM   Specimen: Pleura  Result Value Ref Range Status   Specimen Description PLEURAL  Final   Special Requests PLEURAL  Final   Gram Stain   Final    NO ORGANISMS SEEN WBC PRESENT,BOTH PMN AND MONONUCLEAR CYTOSPIN SMEAR Performed at [facility], [address] [place], [place] [address]    Report Status 03/01/2022 FINAL  Final  Culture, body fluid w Gram Stain-bottle     Status: None (Preliminary result)   Collection Time: 03/01/22 10:20 AM   Specimen: Pleura  Result Value Ref Range Status   Specimen Description PLEURAL  Final   Special Requests   Final    BOTTLES DRAWN AEROBIC AND ANAEROBIC Blood Culture adequate volume   Culture   Final    NO GROWTH 2 DAYS Performed at [facility], [address], [place], [place] [address]    Report Status PENDING  Incomplete     Scheduled Meds:  aspirin EC  324 mg Oral Daily   calcium acetate  667 mg Oral TID WC   Chlorhexidine Gluconate Cloth  6 each Topical Q0600   doxazosin  4 mg Oral Daily   feeding supplement (NEPRO CARB STEADY)  237 mL Oral BID BM   heparin  5,000 Units Subcutaneous Q8H   hydrALAZINE  25 mg Oral TID    insulin aspart  0-5 Units Subcutaneous QHS   insulin aspart  0-9 Units Subcutaneous TID WC   isosorbide dinitrate  30 mg Oral TID   metoprolol tartrate  75 mg Oral BID   sertraline  100 mg Oral Daily   Continuous Infusions:  amiodarone 30 mg/hr (03/03/22 1138)   meropenem (MERREM) IV     potassium chloride 10 mEq (03/03/22 1240)    Procedures/Studies: CT ABDOMEN PELVIS WO CONTRAST  Result Date: 03/03/2022 CLINICAL DATA:  Abdominal pain, acute, nonlocalized. Recent thoracentesis. Esophageal cancer. EXAM: CT ABDOMEN AND PELVIS WITHOUT CONTRAST TECHNIQUE: Multidetector CT imaging of the abdomen and pelvis was performed following the standard protocol without IV contrast. RADIATION DOSE REDUCTION: This exam was performed according to the departmental dose-optimization program which includes automated exposure control, adjustment of the mA and/or kV according to patient size and/or use of iterative reconstruction technique. COMPARISON:  CT of the abdomen and pelvis 01/23/2022. PET scan 02/10/2022. FINDINGS: Lower chest: Moderate size right pleural effusion is present. Mild dependent atelectasis is associated. There is no scratched at no pneumothorax is present. Dense coronary artery calcifications are present. No significant pericardial effusion is present. Hepatobiliary: No focal liver abnormality is seen. No gallstones, gallbladder wall thickening, or biliary dilatation. Pancreas: Unremarkable. No pancreatic ductal dilatation or surrounding inflammatory changes. Spleen: Normal in size without focal abnormality. Adrenals/Urinary Tract: Adrenal glands are within normal limits bilaterally. Stable bilateral simple cysts are present. Chronic stranding about both kidneys is stable. No other  mass lesion or stone is present. No obstruction is present. Ureters are within normal limits. The urinary bladder is within normal limits. Stomach/Bowel: Stomach and duodenum are within normal limits. Small bowel is  unremarkable. Terminal ileum is normal. The appendix is visualized and within normal limits. Loops of small bowel are present in the anterior right abdomen, likely representing adhesions. The ascending and transverse colon are within normal limits. Descending and sigmoid colon are normal. Vascular/Lymphatic: Atherosclerotic calcifications are present in the aorta and branch vessels. No aneurysm is present. Reproductive: Prostate measures 5.4 cm in transverse diameter. Other: No abdominal wall hernia or abnormality. No abdominopelvic ascites. Musculoskeletal: Vertebral body heights and alignment are normal. No focal osseous abnormalities are present. Bony pelvis is within normal limits. Hips are located and normal. IMPRESSION: 1. Moderate size right pleural effusion and associated atelectasis. 2. No acute or focal lesion to explain the patient's abdominal pain. 3. Loops of small bowel in the anterior right abdomen, likely representing adhesions. No obstruction is associated. 4. Coronary artery disease. 5. Aortic Atherosclerosis (ICD10-I70.0). Electronically Signed   By: San Morelle M.D.   On: 03/03/2022 13:43   CT HEAD WO CONTRAST (5MM)  Result Date: 03/03/2022 CLINICAL DATA:  Altered mental status. EXAM: CT HEAD WITHOUT CONTRAST TECHNIQUE: Contiguous axial images were obtained from the base of the skull through the vertex without intravenous contrast. RADIATION DOSE REDUCTION: This exam was performed according to the departmental dose-optimization program which includes automated exposure control, adjustment of the mA and/or kV according to patient size and/or use of iterative reconstruction technique. COMPARISON:  None Available. FINDINGS: Brain: Mild chronic ischemic white matter disease. No mass effect or midline shift is noted. Ventricular size is within normal limits. There is no evidence of mass lesion, hemorrhage or acute infarction. Vascular: No hyperdense vessel or unexpected calcification.  Skull: Normal. Negative for fracture or focal lesion. Sinuses/Orbits: No acute finding. Other: None. IMPRESSION: No acute intracranial abnormality seen. Electronically Signed   By: Marijo Conception M.D.   On: 03/03/2022 13:31   ECHOCARDIOGRAM COMPLETE  Result Date: 03/01/2022    ECHOCARDIOGRAM REPORT   Patient Name:   WILFORD MERRYFIELD Date of Exam: 03/01/2022 Medical Rec #:  [id number]        Height:       70.0 in Accession #:    [id number]       Weight:       181.4 lb Date of Birth:  1955-11-06        BSA:          2.003 m Patient Age:    61 years         BP:           131/55 mmHg Patient Gender: M                HR:           69 bpm. Exam Location:  [facility] Procedure: 2D Echo, Cardiac Doppler and Color Doppler Indications:    Dyspnea  History:        Patient has prior history of Echocardiogram examinations, most                 recent 07/25/2021. CHF and Cardiomyopathy, CAD, Prior CABG,                 Stroke and PAD, Arrythmias:Tachycardia, Signs/Symptoms:Shortness                 of Breath, Edema and  Chest Pain; Risk Factors:Hypertension,                 Diabetes and Dyslipidemia.  Sonographer:    Wenda Low Referring Phys: [id number] Reader  1. Left ventricular ejection fraction, by estimation, is 40 to 45%. The left ventricle has mildly decreased function. The left ventricle demonstrates global hypokinesis. There is moderate left ventricular hypertrophy. Left ventricular diastolic parameters are indeterminate.  2. Ventricular septum is flattened in systole consistent with RV pressure overload. . Right ventricular systolic function is moderately reduced. The right ventricular size is severely enlarged. There is mildly elevated pulmonary artery systolic pressure.  3. Left atrial size was severely dilated.  4. Catheter tip noted in RA. Right atrial size was severely dilated.  5. The mitral valve is normal in structure. No evidence of mitral valve regurgitation. No evidence of mitral  stenosis.  6. The tricuspid valve is abnormal.  7. The aortic valve is tricuspid. There is mild calcification of the aortic valve. There is mild thickening of the aortic valve. Aortic valve regurgitation is not visualized. No aortic stenosis is present.  8. The inferior vena cava is dilated in size with >50% respiratory variability, suggesting right atrial pressure of 8 mmHg. FINDINGS  Left Ventricle: Left ventricular ejection fraction, by estimation, is 40 to 45%. The left ventricle has mildly decreased function. The left ventricle demonstrates global hypokinesis. The left ventricular internal cavity size was normal in size. There is  moderate left ventricular hypertrophy. Left ventricular diastolic parameters are indeterminate. Right Ventricle: Ventricular septum is flattened in systole consistent with RV pressure overload. The right ventricular size is severely enlarged. Right vetricular wall thickness was not well visualized. Right ventricular systolic function is moderately reduced. There is mildly elevated pulmonary artery systolic pressure. The tricuspid regurgitant velocity is 2.91 m/s, and with an assumed right atrial pressure of 8 mmHg, the estimated right ventricular systolic pressure is 10.9 mmHg. Left Atrium: Left atrial size was severely dilated. Right Atrium: Catheter tip noted in RA. Right atrial size was severely dilated. Pericardium: There is no evidence of pericardial effusion. Mitral Valve: The mitral valve is normal in structure. No evidence of mitral valve regurgitation. No evidence of mitral valve stenosis. MV peak gradient, 4.2 mmHg. The mean mitral valve gradient is 2.0 mmHg. Tricuspid Valve: The tricuspid valve is abnormal. Tricuspid valve regurgitation is mild . No evidence of tricuspid stenosis. Aortic Valve: The aortic valve is tricuspid. There is mild calcification of the aortic valve. There is mild thickening of the aortic valve. There is mild aortic valve annular calcification. Aortic  valve regurgitation is not visualized. No aortic stenosis  is present. Aortic valve mean gradient measures 3.5 mmHg. Aortic valve peak gradient measures 7.2 mmHg. Aortic valve area, by VTI measures 2.94 cm. Pulmonic Valve: The pulmonic valve was not well visualized. Pulmonic valve regurgitation is not visualized. No evidence of pulmonic stenosis. Aorta: The aortic root is normal in size and structure. Venous: The inferior vena cava is dilated in size with greater than 50% respiratory variability, suggesting right atrial pressure of 8 mmHg. IAS/Shunts: No atrial level shunt detected by color flow Doppler.  LEFT VENTRICLE PLAX 2D LVIDd:         5.40 cm      Diastology LVIDs:         4.30 cm      LV e' medial:    5.87 cm/s LV PW:         1.40 cm  LV E/e' medial:  14.5 LV IVS:        1.20 cm      LV e' lateral:   11.70 cm/s LVOT diam:     2.10 cm      LV E/e' lateral: 7.3 LV SV:         81 LV SV Index:   41 LVOT Area:     3.46 cm  LV Volumes (MOD) LV vol d, MOD A2C: 118.0 ml LV vol d, MOD A4C: 98.3 ml LV vol s, MOD A2C: 85.2 ml LV vol s, MOD A4C: 48.6 ml LV SV MOD A2C:     32.8 ml LV SV MOD A4C:     98.3 ml LV SV MOD BP:      39.3 ml RIGHT VENTRICLE RV Basal diam:  4.80 cm RV Mid diam:    3.50 cm RV S prime:     10.10 cm/s TAPSE (M-mode): 1.7 cm LEFT ATRIUM              Index        RIGHT ATRIUM           Index LA diam:        5.80 cm  2.90 cm/m   RA Area:     26.40 cm LA Vol (A2C):   140.0 ml 69.91 ml/m  RA Volume:   84.40 ml  42.14 ml/m LA Vol (A4C):   91.4 ml  45.64 ml/m LA Biplane Vol: 114.0 ml 56.92 ml/m  AORTIC VALVE                    PULMONIC VALVE AV Area (Vmax):    2.66 cm     PV Vmax:       0.87 m/s AV Area (Vmean):   2.71 cm     PV Peak grad:  3.0 mmHg AV Area (VTI):     2.94 cm AV Vmax:           134.00 cm/s AV Vmean:          88.233 cm/s AV VTI:            0.277 m AV Peak Grad:      7.2 mmHg AV Mean Grad:      3.5 mmHg LVOT Vmax:         103.00 cm/s LVOT Vmean:        69.000 cm/s LVOT VTI:           0.235 m LVOT/AV VTI ratio: 0.85  AORTA Ao Root diam: 3.90 cm Ao Asc diam:  3.40 cm MITRAL VALVE               TRICUSPID VALVE MV Area (PHT): 3.05 cm    TR Peak grad:   33.9 mmHg MV Area VTI:   2.56 cm    TR Vmax:        291.00 cm/s MV Peak grad:  4.2 mmHg MV Mean grad:  2.0 mmHg    SHUNTS MV Vmax:       1.02 m/s    Systemic VTI:  0.24 m MV Vmean:      61.2 cm/s   Systemic Diam: 2.10 cm MV Decel Time: 249 msec MV E velocity: 85.30 cm/s MV A velocity: 75.80 cm/s MV E/A ratio:  1.13 Carlyle Dolly MD Electronically signed by Carlyle Dolly MD Signature Date/Time: 03/01/2022/1:08:36 PM    Final    DG Chest 1 View  Result Date: 03/01/2022 CLINICAL DATA:  Status post right thoracentesis. EXAM: CHEST  1 VIEW COMPARISON:  CT chest and chest x-ray from yesterday. FINDINGS: Unchanged tunneled right internal jugular dialysis catheter. Unchanged mild cardiomegaly status post CABG. Decreased now trace small right pleural effusion. No pneumothorax. Improved aeration of the right lower lobe. No acute osseous abnormality. IMPRESSION: 1. Decreased right pleural effusion status post thoracentesis. No pneumothorax. Electronically Signed   By: Titus Dubin M.D.   On: 03/01/2022 11:26   US THORACENTESIS ASP PLEURAL SPACE W/IMG GUIDE  Result Date: 03/01/2022 INDICATION: Right pleural effusion. EXAM: ULTRASOUND GUIDED RIGHT THORACENTESIS MEDICATIONS: None. COMPLICATIONS: None immediate. PROCEDURE: An ultrasound guided thoracentesis was thoroughly discussed with the patient and questions answered. The benefits, risks, alternatives and complications were also discussed. The patient understands and wishes to proceed with the procedure. Written consent was obtained. Ultrasound was performed to localize and mark an adequate pocket of fluid in the right chest. The area was then prepped and draped in the normal sterile fashion. 1% Lidocaine was used for local anesthesia. Under ultrasound guidance a 6 Fr Safe-T-Centesis  catheter was introduced. Thoracentesis was performed. The catheter was removed and a dressing applied. FINDINGS: A total of approximately 1.3 L of dark yellow fluid was removed. Samples were sent to the laboratory as requested by the clinical team. IMPRESSION: Successful ultrasound guided right thoracentesis yielding 1.3 L of pleural fluid. Electronically Signed   By: Titus Dubin M.D.   On: 03/01/2022 11:23   CT Angio Chest Pulmonary Embolism (PE) W or WO Contrast  Result Date: 02/28/2022 CLINICAL DATA:  Acute respiratory failure, cough, shortness of breath, hypoxia EXAM: CT ANGIOGRAPHY CHEST WITH CONTRAST TECHNIQUE: Multidetector CT imaging of the chest was performed using the standard protocol during bolus administration of intravenous contrast. Multiplanar CT image reconstructions and MIPs were obtained to evaluate the vascular anatomy. RADIATION DOSE REDUCTION: This exam was performed according to the departmental dose-optimization program which includes automated exposure control, adjustment of the mA and/or kV according to patient size and/or use of iterative reconstruction technique. CONTRAST:  77m OMNIPAQUE IOHEXOL 350 MG/ML SOLN COMPARISON:  01/23/2022 FINDINGS: Cardiovascular: There is homogeneous enhancement in thoracic aorta. Coronary artery calcifications are seen. There is previous coronary artery bypass surgery. There is ectasia of main pulmonary artery measuring 4.1 cm suggesting pulmonary arterial hypertension. There are no intraluminal filling defects in central pulmonary artery branches. Evaluation of small peripheral pulmonary artery branches is limited by motion artifacts and atelectasis. Heart is enlarged in size. There is reflux of contrast into hepatic veins suggesting tricuspid incompetence. Tip of right IJ central venous catheter is seen in the region of right atrium. Mediastinum/Nodes: No new significant lymphadenopathy seen. Lungs/Pleura: There is large right pleural effusion  with significant interval increase. Density measurements in the right pleural effusion are less than 20 Hounsfield units. There is minimal left pleural effusion. There is compression atelectasis in right lower lobe and to a lesser extent in right middle lobe and right upper lobe. No focal infiltrates are seen in left lung. There is no pneumothorax. Upper Abdomen: There is fatty infiltration in the liver. Spleen is enlarged measuring 15.4 cm in AP diameter. Multiple diverticula are seen in visualized portions of colon. Musculoskeletal: Deformity in the right tenth rib may suggest old ununited fracture. Review of the MIP images confirms the above findings. IMPRESSION: There is no evidence of central pulmonary artery embolism. Evaluation of small peripheral pulmonary artery branches is limited by motion artifacts. There is ectasia of main pulmonary artery suggesting pulmonary arterial  hypertension. Coronary artery disease. Previous coronary artery bypass surgery. Cardiomegaly. Tricuspid incompetence. There is large right pleural effusion with significant interval increase in size. Compression atelectasis is seen in right lung, especially in right lower lobe. There is minimal left pleural effusion. Fatty liver.  Enlarged spleen.  Diverticulosis of colon. Other findings as described in the body of the report. Electronically Signed   By: Elmer Picker M.D.   On: 02/28/2022 18:07   DG Chest Port 1 View  Result Date: 02/28/2022 CLINICAL DATA:  Chest pain and dyspnea EXAM: PORTABLE CHEST 1 VIEW COMPARISON:  01/29/2022 chest radiograph. FINDINGS: Intact sternotomy wires. Pacer pad overlies the left chest. Right internal jugular central venous catheter terminates over the cavoatrial junction. Stable cardiomediastinal silhouette with mild cardiomegaly. No pneumothorax. Small right pleural effusion is slightly increased. No left pleural effusion. Slightly low lung volumes, similar. Cephalization of the pulmonary  vasculature without overt pulmonary edema. Mild hazy right lung base opacity is mildly increased. IMPRESSION: 1. Stable mild cardiomegaly without overt pulmonary edema. 2. Small right pleural effusion, slightly increased. 3. Mildly increased hazy right lung base opacity, favor atelectasis. Electronically Signed   By: Ilona Sorrel M.D.   On: 02/28/2022 13:40   NM PET Image Restage (PS) Skull Base to Thigh (F-18 FDG)  Result Date: 02/11/2022 CLINICAL DATA:  Subsequent treatment strategy for esophageal carcinoma. EXAM: NUCLEAR MEDICINE PET SKULL BASE TO THIGH TECHNIQUE: 9.91 mCi F-18 FDG was injected intravenously. Full-ring PET imaging was performed from the skull base to thigh after the radiotracer. CT data was obtained and used for attenuation correction and anatomic localization. Fasting blood glucose: 228 mg/dl COMPARISON:  07/15/2021 FINDINGS: Mediastinal blood pool activity: SUV max 2.22 Liver activity: SUV max NA NECK: No hypermetabolic lymph nodes in the neck. Incidental CT findings: None CHEST: No tracer avid supraclavicular, axillary, mediastinal, or hilar lymph nodes. No abnormal hypermetabolism associated with the esophagus. No tracer avid pulmonary nodules. Small to moderate right pleural effusion is again noted. Incidental CT findings: Cardiac enlargement. Aortic atherosclerosis. Status post CABG. ABDOMEN/PELVIS: No abnormal hypermetabolic activity within the liver, pancreas, adrenal glands, or spleen. No hypermetabolic lymph nodes in the abdomen or pelvis. Incidental CT findings: Bilateral fluid density kidney cysts are again noted compatible with simple cysts. No follow-up imaging recommended. Similar appearance of nonspecific bilateral perinephric fat stranding. Decrease abdominal ascites. Sigmoid diverticulosis without signs of acute diverticulitis. SKELETON: No focal hypermetabolic activity to suggest skeletal metastasis. Incidental CT findings: Subacute to chronic scratch set healing right  posterior rib fractures are again noted. Chronic right anterior rib fracture deformities are also again noted. IMPRESSION: 1. There are no signs to suggest residual or recurrent FDG avid tumor within the esophagus. No FDG avid adenopathy or signs of solid organ metastasis. 2. Persistent right pleural effusion.  Decrease in ascites. 3. Cardiac enlargement. Status post CABG. Aortic Atherosclerosis (ICD10-I70.0). Electronically Signed   By: Kerby Moors M.D.   On: 02/11/2022 07:47    Orson Eva, DO  [facility]  If 7PM-7AM, please contact night-coverage [email or web address] Password [email or web address] 03/03/2022, 2:03 PM   LOS: 3 days

## 2022-03-03 NOTE — Consult Note (Addendum)
NAME:  [name], MRN:  [id number], DOB:  02-24-1956, LOS: 3 ADMISSION DATE:  02/28/2022, CONSULTATION DATE:  03/03/2022  REFERRING MD:  Carles Collet, TRH, CHIEF COMPLAINT:  hemoptysis   History of Present Illness:  Adm from HD facility with shortness of breath x 2 days , resp distress, sat 90% on 4L [place], requiring BiPAP in ED , SVT resolved with vagal maneuver Recent admit 8/19-25 , underwent cath > patent grafts CTA chest >> large RT effusion , large PA 4.1 cm, enlarged spleen Found to have enterobacteremia , Abx changed from ceftx to cefepime 9/21 more somnolent & confused >> head CT neg CT abdomen>> neg , abx changed to ceftaz ,coughing up bright red blood Seen by cardiology,  PCCM consulted for hemoptysis     Pertinent  Medical History  ESRD on HD CABG Esophageal CA - PET scan 9/1 >> neg OSA   Significant Hospital Events: Including procedures, antibiotic start and stop dates in addition to other pertinent events   9/19 Rt thora >> 1.3 L , transudative, LDH 85, cytology neg 9/19 echo >> EF 40%, decreased RV, RV overload -flat septum 9/19 started on IV amiodarone  Interim History / Subjective:  Copious amounts of liquid sputum with coughing RN concerned about aspiration with tablets He is on amiodarone drip  Objective   Blood pressure (!) 132/54, pulse 72, temperature 98.3 F (36.8 C), temperature source Axillary, resp. rate 18, height '5\' 10"'$  (1.778 m), weight 80.6 kg, SpO2 95 %.        Intake/Output Summary (Last 24 hours) at 03/03/2022 1745 Last data filed at 03/03/2022 1435 Gross per 24 hour  Intake 609.79 ml  Output 1800 ml  Net -1190.21 ml   Filed Weights   03/02/22 1623 03/02/22 1832 03/02/22 2018  Weight: 80.5 kg 82.5 kg 80.6 kg    Examination: General: Acute and chronically ill-appearing, mild distress, on nasal cannula HENT: Mild pallor, no icterus, no JVD Lungs: Decreased breath sounds on right, no accessory muscle use Cardiovascular: S1-S2 regular,  ejection systolic murmur at base , right IJ permacath with dressing site overt with secretions Abdomen: Soft, mild epigastric tenderness, no guarding Extremities: No edema, no deformity Neuro: Awake, alert, interactive, nonfocal   Chest x-ray 9/21 shows small right effusion, cardiomegaly and pulmonary vascular congestion  Labs show normal electrolytes, no leukocytosis, stable anemia and thrombocytopenia  Resolved Hospital Problem list     Assessment & Plan:  Hemoptysis -unclear cause, CT angiogram does not reveal an etiology He does have severe pulm hypertension and suspected aspiration pneumonia Last episode was yesterday and he continues to have copious amounts of purulent sputum -Swallow evaluation -Mildly thrombocytopenic and may also have platelet dysfunction  Right pleural effusion -transudative, related to [place] and ESRD -Small on chest x-ray and ultrasound today, no need for repeat thoracentesis  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, previous RHC 02/2019 showed PCWP 24, mean PA pr 28   Enterobacteremia with indwelling HD cath - will need repeat cultures to document clearance -pending from 9/21,  ID consult,  -On meropenem  HFrEF / SVT -Cardiology assisting, on beta-blocker, hydralazine nitrates combination  ESRD-HD planned for today, renal following  Best Practice (right click and "Reselect all SmartList Selections" daily)   per primary  Code Status:  DNR Last date of multidisciplinary goals of care discussion [per TRH]  Labs   CBC: Recent Labs  Lab 02/28/22 1238 03/01/22 0437 03/02/22  8119 03/03/22 0612 03/03/22 1415  WBC 8.8 7.0 6.3 5.7  --   NEUTROABS 8.2*  --   --   --   --   HGB 10.7* 9.2* 9.0* 9.4* 8.8*  HCT 35.0* 29.2* 28.5* 29.4* 28.1*  MCV 87.5 85.6 85.1 85.0  --   PLT 198 157 148* 154  --     Basic Metabolic Panel: Recent Labs  Lab 03/01/22 0040 03/01/22 0437 03/01/22 2123  03/02/22 0238 03/03/22 0612  NA 135 137 134* 135 135  K 4.0 4.1 3.3* 3.7 3.5  CL 100 102 98 99 100  CO2 21* '23 24 23 24  '$ GLUCOSE 195* 183* 180* 194* 227*  BUN 56* 59* 49* 51* 43*  CREATININE 5.22* 5.38* 4.41* 4.76* 4.41*  CALCIUM 8.1* 8.3* 8.0* 8.1* 8.2*  MG 2.1  --  1.8 1.9  --   PHOS 6.1*  --   --  4.3 2.6   GFR: Estimated Creatinine Clearance: 17.2 mL/min (A) (by C-G formula based on SCr of 4.41 mg/dL (H)). Recent Labs  Lab 02/28/22 1238 02/28/22 1531 03/01/22 0437 03/02/22 0238 03/03/22 0612 03/03/22 1415  PROCALCITON  --   --   --   --   --  37.70  WBC 8.8  --  7.0 6.3 5.7  --   LATICACIDVEN 3.5* 1.9  --   --   --   --     Liver Function Tests: Recent Labs  Lab 02/28/22 1238 03/02/22 0238 03/03/22 0612 03/03/22 1415  AST 33  --   --  15  ALT 32  --   --  21  ALKPHOS 135*  --   --  93  BILITOT 1.0  --   --  0.7  PROT 7.6  --   --  5.9*  ALBUMIN 4.0 3.3* 3.3* 3.1*   No results for input(s): "LIPASE", "AMYLASE" in the last 168 hours. Recent Labs  Lab 03/03/22 1415  AMMONIA 32    ABG    Component Value Date/Time   PHART 7.407 03/25/2019 2110   PCO2ART 32.7 03/25/2019 2110   PO2ART 56.0 (L) 03/25/2019 2110   HCO3 27.4 03/03/2022 1415   TCO2 19 (L) 12/21/2021 0806   ACIDBASEDEF 2.5 (H) 02/28/2022 1308   O2SAT 91.8 03/03/2022 1415     Coagulation Profile: Recent Labs  Lab 02/28/22 1238  INR 1.4*    Cardiac Enzymes: No results for input(s): "CKTOTAL", "CKMB", "CKMBINDEX", "TROPONINI" in the last 168 hours.  HbA1C: Hemoglobin A1C  Date/Time Value Ref Range Status  07/16/2020 12:00 AM normal  Final    Comment:    6.7    Hgb A1c MFr Bld  Date/Time Value Ref Range Status  01/31/2022 07:58 AM 7.7 (H) 4.8 - 5.6 % Final    Comment:    (NOTE) Pre diabetes:          5.7%-6.4%  Diabetes:              >6.4%  Glycemic control for   <7.0% adults with diabetes   07/25/2021 05:39 AM 6.4 (H) 4.8 - 5.6 % Final    Comment:    (NOTE) Pre  diabetes:          5.7%-6.4%  Diabetes:              >6.4%  Glycemic control for   <7.0% adults with diabetes     CBG: Recent Labs  Lab 03/02/22 1927 03/03/22 0725 03/03/22 1117 03/03/22 1214 03/03/22 1617  GLUCAP  155* 188* 147* 151* 198*    Review of Systems:   Purulent sputum Coughing up blood Shortness of breath   Past Medical History:  He,  has a past medical history of Anemia, Arthritis, CAD (coronary artery disease) (03/03/2019), Cancer ([place]), CHF (congestive heart failure) ([place]) (2020), Chronic combined systolic and diastolic heart failure ([place]) (04/26/2018), Depression (05/31/2021), Fatigue (10/14/2020), Hypertension, Myocardial infarction [facility]), Sleep apnea, Stroke ([place]) (12/14/2020), Type 2 diabetes mellitus with diabetic nephropathy ([place]) (04/23/2019), and Vision loss of right eye (02/23/2021).   Surgical History:   Past Surgical History:  Procedure Laterality Date   AV FISTULA PLACEMENT Left 12/21/2021   Procedure: LEFT ARM ARTERIOVENOUS (AV) FISTULA CREATION;  Surgeon: Rosetta Posner, MD;  Location: [facility];  Service: Vascular;  Laterality: Left;   BIOPSY  06/27/2019   Procedure: BIOPSY;  Surgeon: Rogene Houston, MD;  [facility];  Service: Endoscopy;;  ascending colon polyp   BIOPSY  10/21/2020   Procedure: BIOPSY;  Surgeon: Rogene Houston, MD;  [facility];  Service: Endoscopy;;  esophageal,gastric polyp   BIOPSY  02/24/2021   Procedure: BIOPSY;  Surgeon: Rogene Houston, MD;  [facility];  Service: Endoscopy;;  distal and proximal esophageal biopsies    CARDIAC SURGERY     CATARACT EXTRACTION     COLONOSCOPY N/A 06/27/2019   [name]:Diverticulosis in the entire examined colon. tubular adenoma in ascending colon, 22m tubular adenoma in prox sigmoid. external hemorrhoids   CORONARY ARTERY BYPASS GRAFT N/A 03/25/2019   Procedure: CORONARY ARTERY BYPASS GRAFTING (CABG) X  4 USING LEFT INTERNAL MAMMARY ARTERY AND RIGHT  SAPHENOUS VEIN GRAFTS;  Surgeon: LLajuana Matte MD;  [facility]  Service: Open Heart Surgery;  Laterality: N/A;   ESOPHAGOGASTRODUODENOSCOPY (EGD) WITH PROPOFOL N/A 10/21/2020   rehman:Normal hypopharynx.Normal proximal esophagus and mid esophagus.Esophageal mucosal changes consistent with long-segment Barrett's esophagus. (Focal low-grade dysplasia and atypia proximally) z line irregular 37 cm from incisors, 3 cm HH, single gastric polyp (fundic gland) normal duodenal bulb/second portion of duodenum, proximal margin of Barrett's at 34 cm   ESOPHAGOGASTRODUODENOSCOPY (EGD) WITH PROPOFOL N/A 02/24/2021   Procedure: ESOPHAGOGASTRODUODENOSCOPY (EGD) WITH PROPOFOL;  Surgeon: RRogene Houston MD;  [facility];  Service: Endoscopy;  Laterality: N/A;  1:35   ESOPHAGOGASTRODUODENOSCOPY (EGD) WITH PROPOFOL N/A 03/21/2021   Procedure: ESOPHAGOGASTRODUODENOSCOPY (EGD) WITH PROPOFOL;  Surgeon: HCarol Ada MD;  [facility]  Service: Endoscopy;  Laterality: N/A;   ESOPHAGOGASTRODUODENOSCOPY (EGD) WITH PROPOFOL N/A 10/23/2021   Procedure: ESOPHAGOGASTRODUODENOSCOPY (EGD) WITH PROPOFOL;  Surgeon: DDoran Stabler MD;  [facility]  Service: Gastroenterology;  Laterality: N/A;   EXCISION MORTON'S NEUROMA     EYE SURGERY Left    retina   INCISION AND DRAINAGE OF WOUND Right 06/28/2021   Procedure: IRRIGATION AND DEBRIDEMENT WOUND;  Surgeon: PCindra Presume MD;  [place] [facility]  Service: Plastics;  Laterality: Right;  1.5 hour   INSERTION OF DIALYSIS CATHETER Right 12/21/2021   Procedure: INSERTION OF TUNNELED DIALYSIS CATHETER;  Surgeon: ERosetta Posner MD;  [facility];  Service: Vascular;  Laterality: Right;   LEFT HEART CATH AND CORS/GRAFTS ANGIOGRAPHY N/A 02/01/2022   Procedure: LEFT HEART CATH AND CORS/GRAFTS ANGIOGRAPHY;  Surgeon: JMartinique Peter M, MD;  [facility];  Service: Cardiovascular;  Laterality: N/A;   POLYPECTOMY  06/27/2019    Procedure: POLYPECTOMY;  Surgeon: RRogene Houston MD;  [facility];  Service: Endoscopy;;  proximal sigmoid colon   RIGHT/LEFT HEART CATH AND CORONARY ANGIOGRAPHY N/A 03/12/2019   Procedure: RIGHT/LEFT HEART CATH AND CORONARY ANGIOGRAPHY;  Surgeon: [name], MD;  Location: [place] [facility];  Service: Cardiovascular;  Laterality: N/A;   SHOULDER ARTHROSCOPY WITH ROTATOR CUFF REPAIR AND SUBACROMIAL DECOMPRESSION Right 08/26/2020   Procedure: RIGHT SHOULDER MINI OPEN ROTATOR CUFF REPAIR AND SUBACROMIAL DECOMPRESSION WITH PATCH GRAFT;  Surgeon: Susa Day, MD;  [facility];  Service: Orthopedics;  Laterality: Right;  90 MINS GENERAL WITH BLOCK   SKIN SPLIT GRAFT Right 06/28/2021   Procedure: SKIN GRAFT SPLIT THICKNESS;  Surgeon: [name], MD;  Location: [place];  Service: Plastics;  Laterality: Right;   TEE WITHOUT CARDIOVERSION N/A 03/25/2019   Procedure: TRANSESOPHAGEAL ECHOCARDIOGRAM (TEE);  Surgeon: [name], MD;  Location: [place];  Service: Open Heart Surgery;  Laterality: N/A;     Social History:   reports that he quit smoking about 33 years ago. His smoking use included cigarettes. He has never used smokeless tobacco. He reports that he does not currently use alcohol after a past usage of about 2.0 standard drinks of alcohol per week. He reports that he does not use drugs.   Family History:  His family history includes Colon cancer in his mother; Diabetes in his father; Heart Problems in his father; Valvular heart disease in his father. There is no history of Sleep apnea or Stroke.   Allergies Allergies  Allergen Reactions   Bee Venom Anaphylaxis   Atorvastatin Other (See Comments)    myalgia   Diltiazem Itching   Reglan [Metoclopramide] Other (See Comments)    Suicidal    Rosuvastatin Other (See Comments)    myalgias   Valsartan Itching     Home Medications  Prior to Admission medications   Medication Sig Start Date End Date  Taking? Authorizing Provider  albuterol (VENTOLIN HFA) 108 (90 Base) MCG/ACT inhaler Inhale 2 puffs into the lungs every 6 (six) hours as needed. Patient taking differently: Inhale 2 puffs into the lungs every 6 (six) hours as needed for wheezing or shortness of breath. 06/24/21  Yes Cook, Jayce G, DO  ARIPiprazole (ABILIFY) 5 MG tablet Take 5 mg by mouth daily.   Yes [provider]  ascorbic acid (VITAMIN C) 500 MG tablet Take 1 tablet (500 mg total) by mouth daily. 08/03/21  Yes Allie Bossier, MD  aspirin EC 325 MG tablet Take 325 mg by mouth daily. 02/21/22  Yes [provider]  calcium acetate (PHOSLO) 667 MG capsule Take 667 mg by mouth in the morning, at noon, and at bedtime.   Yes [provider]  calcium-vitamin D (OSCAL WITH D) 500-5 MG-MCG tablet Take 1 tablet by mouth 2 (two) times daily.   Yes [provider]  carvedilol (COREG) 12.5 MG tablet Take 12.5 mg by mouth 2 (two) times daily. 02/21/22  Yes [provider]  doxazosin (CARDURA) 4 MG tablet Take 1 tablet (4 mg total) by mouth daily. 09/14/21  Yes Loel Dubonnet, NP  EPINEPHrine 0.3 mg/0.3 mL IJ SOAJ injection Inject 0.3 mg into the muscle as needed for anaphylaxis. 01/25/21  Yes [name], MD  hydrALAZINE (APRESOLINE) 100 MG tablet Take 1 tablet (100 mg total) by mouth 3 (three) times daily. 11/01/21  Yes Loel Dubonnet, NP  hydrOXYzine (VISTARIL) 25 MG capsule TAKE ONE CAPSULE BY MOUTH NIGHTLY AS NEEDED FOR ALLERGIES AND INSOMNIA Patient taking differently: Take 25 mg by mouth  at bedtime as needed (allerigies and insomnia). 11/01/21  Yes [name], MD  isosorbide dinitrate (ISORDIL) 30 MG tablet Take 30 mg by mouth 3 (three) times daily. 02/21/22  Yes [provider]  lidocaine (ASPERCREME LIDOCAINE) 4 % Place 1 patch onto the skin daily. Wear for 12 hours then remove, wait 12 hours before applying a new patch to lower back   Yes [provider]  Multiple  Vitamins-Minerals (THERATRUM COMPLETE) TABS Take 1 tablet by mouth daily.   Yes [provider]  nitroGLYCERIN (NITROSTAT) 0.4 MG SL tablet Place 1 tablet (0.4 mg total) under the tongue every 5 (five) minutes as needed for chest pain. 09/14/21  Yes Loel Dubonnet, NP  Nutritional Supplements (FEEDING SUPPLEMENT, NEPRO CARB STEADY,) LIQD Take 237 mLs by mouth 2 (two) times daily with a meal.   Yes [provider]  omeprazole (PRILOSEC) 40 MG capsule Take 40 mg by mouth in the morning and at bedtime.   Yes [provider]  ondansetron (ZOFRAN) 4 MG tablet Take 4 mg by mouth every 8 (eight) hours as needed for nausea or vomiting.   Yes [provider]  ondansetron (ZOFRAN-ODT) 4 MG disintegrating tablet Take 4 mg by mouth every 8 (eight) hours as needed. 02/20/22  Yes [provider]  REPATHA SURECLICK 902 MG/ML SOAJ Inject 140 mg as directed every 14 (fourteen) days. Patient taking differently: Inject 140 mg as directed every 14 (fourteen) days. Starting 02/08/22 09/14/21  Yes Loel Dubonnet, NP  sertraline (ZOLOFT) 100 MG tablet 1 daily 02/11/22  Yes [name], [name], MD  torsemide (DEMADEX) 20 MG tablet Take 40 mg by mouth daily. 02/21/22  Yes [provider]  traZODone (DESYREL) 50 MG tablet Take 0.5-1 tablets (25-50 mg total) by mouth at bedtime as needed for sleep. 02/11/22  Yes [name], MD  acetaminophen (TYLENOL) 325 MG tablet Take 2 tablets (650 mg total) by mouth every 6 (six) hours as needed for mild pain, moderate pain or fever. 08/02/21   Allie Bossier, MD  cholecalciferol (VITAMIN D) 25 MCG (1000 UNIT) tablet Take 1,000 Units by mouth daily. Patient not taking: Reported on 02/28/2022    [provider]  insulin aspart (NOVOLOG FLEXPEN) 100 UNIT/ML FlexPen Inject into the skin. Patient not taking: Reported on 02/28/2022 02/12/22   [provider]  insulin aspart (NOVOLOG) 100 UNIT/ML injection Inject 0-15 Units into  the skin 3 (three) times daily before meals. Per sliding scale Patient not taking: Reported on 02/28/2022    [provider]  Insulin Pen Needle (B-D ULTRAFINE III SHORT PEN) 31G X 8 MM MISC 1 each by Does not apply route as directed. 05/30/19   Cassandria Anger, MD  isosorbide mononitrate (IMDUR) 30 MG 24 hr tablet Take 1 tablet (30 mg total) by mouth daily. Patient not taking: Reported on 02/28/2022 02/04/22   Niel Hummer A, MD  metoprolol tartrate (LOPRESSOR) 50 MG tablet Take 1 tablet (50 mg total) by mouth 2 (two) times daily. Patient not taking: Reported on 02/28/2022 02/03/22   Regalado, Jerald Kief A, MD  oxyCODONE-acetaminophen (PERCOCET/ROXICET) 5-325 MG tablet Take 1 tablet by mouth every 6 (six) hours as needed. Patient not taking: Reported on 02/28/2022 02/21/22   [provider]     Kara Mead MD. FCCP.  Pulmonary & Critical care Pager : [phone]  If no response to pager , please call [phone] until 7 pm After 7:00 pm call [place]  [phone]  03/03/2022     

## 2022-03-03 NOTE — Progress Notes (Addendum)
Rounding Note    Patient Name: [name] Date of Encounter: 03/03/2022  [place] Cardiologist: Skeet Latch, MD   Subjective   Feels awful this morning. Had nausea starting yesterday evening after HD and says this happens after every HD session. Still nauseated this morning. Reports pain along his throat and this occurs intermittently since being diagnosed with esophageal cancer.   Inpatient Medications    Scheduled Meds:  aspirin EC  324 mg Oral Daily   calcium acetate  667 mg Oral TID WC   Chlorhexidine Gluconate Cloth  6 each Topical Q0600   doxazosin  4 mg Oral Daily   feeding supplement (NEPRO CARB STEADY)  237 mL Oral BID BM   heparin  5,000 Units Subcutaneous Q8H   hydrALAZINE  25 mg Oral TID   insulin aspart  0-5 Units Subcutaneous QHS   insulin aspart  0-9 Units Subcutaneous TID WC   isosorbide dinitrate  30 mg Oral TID   metoprolol tartrate  75 mg Oral BID   sertraline  100 mg Oral Daily   Continuous Infusions:  amiodarone 30 mg/hr (03/02/22 2234)   ceFEPime (MAXIPIME) IV 2 g (03/02/22 2254)   PRN Meds: hydrALAZINE, ondansetron **OR** ondansetron (ZOFRAN) IV, polyethylene glycol, prochlorperazine, traZODone   Vital Signs    Vitals:   03/03/22 0600 03/03/22 0621 03/03/22 0700 03/03/22 0800  BP:  (!) 144/72  (!) 146/65  Pulse: 74 74  82  Resp:    (!) 21  Temp:   97.8 F (36.6 C)   TempSrc:   Oral   SpO2: 96% 96%  97%  Weight:      Height:        Intake/Output Summary (Last 24 hours) at 03/03/2022 1047 Last data filed at 03/02/2022 2000 Gross per 24 hour  Intake 112.97 ml  Output 1800 ml  Net -1687.03 ml      03/02/2022    8:18 PM 03/02/2022    6:32 PM 03/02/2022    4:23 PM  Last 3 Weights  Weight (lbs) 177 lb 11.1 oz 181 lb 14.1 oz 177 lb 7.5 oz  Weight (kg) 80.6 kg 82.5 kg 80.5 kg      Telemetry    NSR, HR in 50's to 60's. 17 beats NSVT overnight. Brief episodes of SVT overnight with HR in the 150's.  - Personally  Reviewed  ECG    No new tracings.   Physical Exam   GEN: Appears uncomfortable in bed.  Neck: No JVD Cardiac: RRR, no murmurs, rubs, or gallops.  Respiratory: Clear to auscultation bilaterally without wheezing or rales. GI: Soft, nontender, non-distended  MS: No pitting edema; No deformity. Neuro:  Nonfocal  Psych: Normal affect   Labs    High Sensitivity Troponin:   Recent Labs  Lab 02/28/22 1238 02/28/22 1532 03/01/22 0040  TROPONINIHS 51* 52* 53*     Chemistry Recent Labs  Lab 02/28/22 1238 03/01/22 0040 03/01/22 0437 03/01/22 2123 03/02/22 0238 03/03/22 0612  NA 138 135   < > 134* 135 135  K 4.4 4.0   < > 3.3* 3.7 3.5  CL 99 100   < > 98 99 100  CO2 20* 21*   < > '24 23 24  '$ GLUCOSE 176* 195*   < > 180* 194* 227*  BUN 48* 56*   < > 49* 51* 43*  CREATININE 4.47* 5.22*   < > 4.41* 4.76* 4.41*  CALCIUM 8.7* 8.1*   < > 8.0* 8.1*  8.2*  MG  --  2.1  --  1.8 1.9  --   PROT 7.6  --   --   --   --   --   ALBUMIN 4.0  --   --   --  3.3* 3.3*  AST 33  --   --   --   --   --   ALT 32  --   --   --   --   --   ALKPHOS 135*  --   --   --   --   --   BILITOT 1.0  --   --   --   --   --   GFRNONAA 14* 11*   < > 14* 13* 14*  ANIONGAP 19* 14   < > '12 13 11   '$ < > = values in this interval not displayed.    Lipids No results for input(s): "CHOL", "TRIG", "HDL", "LABVLDL", "LDLCALC", "CHOLHDL" in the last 168 hours.  Hematology Recent Labs  Lab 03/01/22 0437 03/02/22 0238 03/03/22 0612  WBC 7.0 6.3 5.7  RBC 3.41* 3.35* 3.46*  HGB 9.2* 9.0* 9.4*  HCT 29.2* 28.5* 29.4*  MCV 85.6 85.1 85.0  MCH 27.0 26.9 27.2  MCHC 31.5 31.6 32.0  RDW 17.4* 17.3* 17.6*  PLT 157 148* 154   Thyroid No results for input(s): "TSH", "FREET4" in the last 168 hours.  BNP Recent Labs  Lab 02/28/22 1249  BNP >4,500.0*    DDimer No results for input(s): "DDIMER" in the last 168 hours.   Radiology     Cardiac Studies   Cardiac Catheterization: 01/2022 Mid LM to Dist LM lesion is  25% stenosed.   Ost LAD to Prox LAD lesion is 50% stenosed.   Ost Cx to Prox Cx lesion is 90% stenosed.   Dist RCA-1 lesion is 90% stenosed.   Mid RCA lesion is 75% stenosed.   1st Diag lesion is 80% stenosed.   2nd Diag lesion is 75% stenosed.   1st Mrg lesion is 80% stenosed.   2nd Mrg lesion is 80% stenosed.   Mid LAD lesion is 100% stenosed.   RPAV lesion is 80% stenosed.   Dist RCA-2 lesion is 100% stenosed.   SVG graft was visualized by angiography and is normal in caliber.   SVG graft was visualized by angiography and is normal in caliber.   LIMA graft was visualized by angiography and is large.   SVG graft was visualized by angiography and is normal in caliber.   The graft exhibits no disease.   The graft exhibits no disease.   The graft exhibits no disease.   The graft exhibits no disease.   LV end diastolic pressure is mildly elevated.   Severe 3 vessel obstructive CAD Patent LIMA to the LAD Patent SVG to large second diagonal Patent SVG to OM Patent SVG to PL branch of the RCA Elevated LVEDP 25 mm Hg   Plan: continue medical management.  Echocardiogram: 02/2022 IMPRESSIONS     1. Left ventricular ejection fraction, by estimation, is 40 to 45%. The  left ventricle has mildly decreased function. The left ventricle  demonstrates global hypokinesis. There is moderate left ventricular  hypertrophy. Left ventricular diastolic  parameters are indeterminate.   2. Ventricular septum is flattened in systole consistent with RV pressure  overload. . Right ventricular systolic function is moderately reduced. The  right ventricular size is severely enlarged. There is mildly elevated  pulmonary artery systolic  pressure.   3. Left atrial size was severely dilated.   4. Catheter tip noted in RA. Right atrial size was severely dilated.   5. The mitral valve is normal in structure. No evidence of mitral valve  regurgitation. No evidence of mitral stenosis.   6. The tricuspid  valve is abnormal.   7. The aortic valve is tricuspid. There is mild calcification of the  aortic valve. There is mild thickening of the aortic valve. Aortic valve  regurgitation is not visualized. No aortic stenosis is present.   8. The inferior vena cava is dilated in size with >50% respiratory  variability, suggesting right atrial pressure of 8 mmHg.    Patient Profile     66 y.o. 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 Cardiology consulted due to SVT.   Assessment & Plan    1. Acute HFrEF - Recent echo showed his EF was at 40-45% as outlined above and catheterization in 01/2022 showed patent bypass grafts.  - He has been continued on Lopressor '75mg'$  BID, Isordil '30mg'$  TID and Hydralazine '25mg'$  TID. Not on ACE-I/ARB/ARNI/SGLT2 inhibitor given his ESRD. Volume management per HD.   2. SVT/NSVT - He had multiple episodes despite switching from Coreg to Lopressor, therefore he was ultimately switched to IV Amiodarone on 03/01/2022. Has remained on Lopressor '75mg'$  BID as well.   - He did have 17 beats NSVT overnight and had several episodes of SVT with HR in the 150's (did not receive Lopressor last night by review of the [facility]). Mg was at 1.9 yesterday and K+ is at 3.5 today. Will order additional K+ supplementation to keep K+ ~ 4.0. Will review with Dr. Harrington Challenger but would anticipate continuing IV Amiodarone today given his recurrent arrhythmias overnight.   3. CAD - He is s/p CABG in 03/2019 with patent grafts by recent cath in 01/2022. Remains on ASA and beta-blocker therapy. Intolerant to statins and on Repatha as an outpatient.   4. ESRD/Right Pleural Effusion - Being followed by Nephrology and underwent HD yesterday evening. He did undergo thoracentesis on 9/19 with  1.3 L of fluid removed.   For questions or updates, please contact [place] Please consult [email or web address] for contact info under        Signed, Erma Heritage, PA-C  03/03/2022, 10:47 AM    Patient seen and examined   I agree with findings as noted by B Strader above  Pt very nauseated.   No CP     Neck is full   Lungs are CTA  Cardiac exam:   RRR  NO S3  No significant murmurs  Ext are without edema   Tele:   SVT last night with short burst VT  OVerall lasted about 3 min      One shorter burst of SVT later this AM  IMpression      HFrEF    Volume managed by HD  OVerall not bad  Rhythm  SVT  NSVT    I would keep on IV amiodarone today       REplete electrolytes     CAD   REcent cath with patent grafts       HL   Repatha as outpt  Dorris Carnes MD

## 2022-03-03 NOTE — Progress Notes (Signed)
Dt tat notified at 1500 that patient had coughed up a large amount of blood and mucous again. Dr Tat at bedside with patient. Patient coughed up small amount of blood again after MD left room. Chest x ray ordered. Family remains at bedside.

## 2022-03-03 NOTE — Progress Notes (Signed)
Patient ID: [name], male   DOB: 1956-02-22, 66 y.o.   MRN: [id number] S: no acute events overnight. Tolerated hd yesterday with net UF 1.8L.he reports that he doesn't feel like he has a lot of fluid on. Does report some anxiety O:BP (!) 146/65   Pulse 82   Temp 97.8 F (36.6 C) (Oral)   Resp (!) 21   Ht '5\' 10"'$  (1.778 m)   Wt 80.6 kg   SpO2 97%   BMI 25.50 kg/m   Intake/Output Summary (Last 24 hours) at 03/03/2022 4098 Last data filed at 03/02/2022 2000 Gross per 24 hour  Intake 112.97 ml  Output 1800 ml  Net -1687.03 ml   Intake/Output: I/O last 3 completed shifts: In: 483.8 [I.V.:483.8] Out: 3300 [Other:3300]  Intake/Output this shift:  No intake/output data recorded. Weight change: -3.3 kg Gen: NAD CVS: RRR Resp:CTA b/l Abd: soft, NT/ND Ext: no edema b/l LEs Neuro: awake, alert, following commands, speech clear and coherent Dialysis access: LUE AVF +b/t  Recent Labs  Lab 02/28/22 1238 03/01/22 0040 03/01/22 0437 03/01/22 2123 03/02/22 0238 03/03/22 0612  NA 138 135 137 134* 135 135  K 4.4 4.0 4.1 3.3* 3.7 3.5  CL 99 100 102 98 99 100  CO2 20* 21* '23 24 23 24  '$ GLUCOSE 176* 195* 183* 180* 194* 227*  BUN 48* 56* 59* 49* 51* 43*  CREATININE 4.47* 5.22* 5.38* 4.41* 4.76* 4.41*  ALBUMIN 4.0  --   --   --  3.3* 3.3*  CALCIUM 8.7* 8.1* 8.3* 8.0* 8.1* 8.2*  PHOS  --  6.1*  --   --  4.3 2.6  AST 33  --   --   --   --   --   ALT 32  --   --   --   --   --    Liver Function Tests: Recent Labs  Lab 02/28/22 1238 03/02/22 0238 03/03/22 0612  AST 33  --   --   ALT 32  --   --   ALKPHOS 135*  --   --   BILITOT 1.0  --   --   PROT 7.6  --   --   ALBUMIN 4.0 3.3* 3.3*   No results for input(s): "LIPASE", "AMYLASE" in the last 168 hours. No results for input(s): "AMMONIA" in the last 168 hours. CBC: Recent Labs  Lab 02/28/22 1238 03/01/22 0437 03/02/22 0238 03/03/22 0612  WBC 8.8 7.0 6.3 5.7  NEUTROABS 8.2*  --   --   --   HGB 10.7* 9.2* 9.0* 9.4*   HCT 35.0* 29.2* 28.5* 29.4*  MCV 87.5 85.6 85.1 85.0  PLT 198 157 148* 154   Cardiac Enzymes: No results for input(s): "CKTOTAL", "CKMB", "CKMBINDEX", "TROPONINI" in the last 168 hours. CBG: Recent Labs  Lab 03/02/22 0726 03/02/22 1113 03/02/22 1545 03/02/22 1927 03/03/22 0725  GLUCAP 177* 236* 185* 155* 188*    Iron Studies: No results for input(s): "IRON", "TIBC", "TRANSFERRIN", "FERRITIN" in the last 72 hours. Studies/Results: ECHOCARDIOGRAM COMPLETE  Result Date: 03/01/2022    ECHOCARDIOGRAM REPORT   Patient Name:   [name] Date of Exam: 03/01/2022 Medical Rec #:  [id number]        Height:       70.0 in Accession #:    [id number]       Weight:       181.4 lb Date of Birth:  1955/11/09        BSA:  2.003 m Patient Age:    89 years         BP:           131/55 mmHg Patient Gender: M                HR:           69 bpm. Exam Location:  [facility] Procedure: 2D Echo, Cardiac Doppler and Color Doppler Indications:    Dyspnea  History:        Patient has prior history of Echocardiogram examinations, most                 recent 07/25/2021. CHF and Cardiomyopathy, CAD, Prior CABG,                 Stroke and PAD, Arrythmias:Tachycardia, Signs/Symptoms:Shortness                 of Breath, Edema and Chest Pain; Risk Factors:Hypertension,                 Diabetes and Dyslipidemia.  Sonographer:    Wenda Low Referring Phys: [id number] [place]  1. Left ventricular ejection fraction, by estimation, is 40 to 45%. The left ventricle has mildly decreased function. The left ventricle demonstrates global hypokinesis. There is moderate left ventricular hypertrophy. Left ventricular diastolic parameters are indeterminate.  2. Ventricular septum is flattened in systole consistent with RV pressure overload. . Right ventricular systolic function is moderately reduced. The right ventricular size is severely enlarged. There is mildly elevated pulmonary artery systolic  pressure.  3. Left atrial size was severely dilated.  4. Catheter tip noted in RA. Right atrial size was severely dilated.  5. The mitral valve is normal in structure. No evidence of mitral valve regurgitation. No evidence of mitral stenosis.  6. The tricuspid valve is abnormal.  7. The aortic valve is tricuspid. There is mild calcification of the aortic valve. There is mild thickening of the aortic valve. Aortic valve regurgitation is not visualized. No aortic stenosis is present.  8. The inferior vena cava is dilated in size with >50% respiratory variability, suggesting right atrial pressure of 8 mmHg. FINDINGS  Left Ventricle: Left ventricular ejection fraction, by estimation, is 40 to 45%. The left ventricle has mildly decreased function. The left ventricle demonstrates global hypokinesis. The left ventricular internal cavity size was normal in size. There is  moderate left ventricular hypertrophy. Left ventricular diastolic parameters are indeterminate. Right Ventricle: Ventricular septum is flattened in systole consistent with RV pressure overload. The right ventricular size is severely enlarged. Right vetricular wall thickness was not well visualized. Right ventricular systolic function is moderately reduced. There is mildly elevated pulmonary artery systolic pressure. The tricuspid regurgitant velocity is 2.91 m/s, and with an assumed right atrial pressure of 8 mmHg, the estimated right ventricular systolic pressure is 64.4 mmHg. Left Atrium: Left atrial size was severely dilated. Right Atrium: Catheter tip noted in RA. Right atrial size was severely dilated. Pericardium: There is no evidence of pericardial effusion. Mitral Valve: The mitral valve is normal in structure. No evidence of mitral valve regurgitation. No evidence of mitral valve stenosis. MV peak gradient, 4.2 mmHg. The mean mitral valve gradient is 2.0 mmHg. Tricuspid Valve: The tricuspid valve is abnormal. Tricuspid valve regurgitation is mild  . No evidence of tricuspid stenosis. Aortic Valve: The aortic valve is tricuspid. There is mild calcification of the aortic valve. There is mild thickening of the aortic valve. There  is mild aortic valve annular calcification. Aortic valve regurgitation is not visualized. No aortic stenosis  is present. Aortic valve mean gradient measures 3.5 mmHg. Aortic valve peak gradient measures 7.2 mmHg. Aortic valve area, by VTI measures 2.94 cm. Pulmonic Valve: The pulmonic valve was not well visualized. Pulmonic valve regurgitation is not visualized. No evidence of pulmonic stenosis. Aorta: The aortic root is normal in size and structure. Venous: The inferior vena cava is dilated in size with greater than 50% respiratory variability, suggesting right atrial pressure of 8 mmHg. IAS/Shunts: No atrial level shunt detected by color flow Doppler.  LEFT VENTRICLE PLAX 2D LVIDd:         5.40 cm      Diastology LVIDs:         4.30 cm      LV e' medial:    5.87 cm/s LV PW:         1.40 cm      LV E/e' medial:  14.5 LV IVS:        1.20 cm      LV e' lateral:   11.70 cm/s LVOT diam:     2.10 cm      LV E/e' lateral: 7.3 LV SV:         81 LV SV Index:   41 LVOT Area:     3.46 cm  LV Volumes (MOD) LV vol d, MOD A2C: 118.0 ml LV vol d, MOD A4C: 98.3 ml LV vol s, MOD A2C: 85.2 ml LV vol s, MOD A4C: 48.6 ml LV SV MOD A2C:     32.8 ml LV SV MOD A4C:     98.3 ml LV SV MOD BP:      39.3 ml RIGHT VENTRICLE RV Basal diam:  4.80 cm RV Mid diam:    3.50 cm RV S prime:     10.10 cm/s TAPSE (M-mode): 1.7 cm LEFT ATRIUM              Index        RIGHT ATRIUM           Index LA diam:        5.80 cm  2.90 cm/m   RA Area:     26.40 cm LA Vol (A2C):   140.0 ml 69.91 ml/m  RA Volume:   84.40 ml  42.14 ml/m LA Vol (A4C):   91.4 ml  45.64 ml/m LA Biplane Vol: 114.0 ml 56.92 ml/m  AORTIC VALVE                    PULMONIC VALVE AV Area (Vmax):    2.66 cm     PV Vmax:       0.87 m/s AV Area (Vmean):   2.71 cm     PV Peak grad:  3.0 mmHg AV Area  (VTI):     2.94 cm AV Vmax:           134.00 cm/s AV Vmean:          88.233 cm/s AV VTI:            0.277 m AV Peak Grad:      7.2 mmHg AV Mean Grad:      3.5 mmHg LVOT Vmax:         103.00 cm/s LVOT Vmean:        69.000 cm/s LVOT VTI:          0.235 m LVOT/AV VTI ratio: 0.85  AORTA  Ao Root diam: 3.90 cm Ao Asc diam:  3.40 cm MITRAL VALVE               TRICUSPID VALVE MV Area (PHT): 3.05 cm    TR Peak grad:   33.9 mmHg MV Area VTI:   2.56 cm    TR Vmax:        291.00 cm/s MV Peak grad:  4.2 mmHg MV Mean grad:  2.0 mmHg    SHUNTS MV Vmax:       1.02 m/s    Systemic VTI:  0.24 m MV Vmean:      61.2 cm/s   Systemic Diam: 2.10 cm MV Decel Time: 249 msec MV E velocity: 85.30 cm/s MV A velocity: 75.80 cm/s MV E/A ratio:  1.13 Carlyle Dolly MD Electronically signed by Carlyle Dolly MD Signature Date/Time: 03/01/2022/1:08:36 PM    Final    DG Chest 1 View  Result Date: 03/01/2022 CLINICAL DATA:  Status post right thoracentesis. EXAM: CHEST  1 VIEW COMPARISON:  CT chest and chest x-ray from yesterday. FINDINGS: Unchanged tunneled right internal jugular dialysis catheter. Unchanged mild cardiomegaly status post CABG. Decreased now trace small right pleural effusion. No pneumothorax. Improved aeration of the right lower lobe. No acute osseous abnormality. IMPRESSION: 1. Decreased right pleural effusion status post thoracentesis. No pneumothorax. Electronically Signed   By: Titus Dubin M.D.   On: 03/01/2022 11:26   US THORACENTESIS ASP PLEURAL SPACE W/IMG GUIDE  Result Date: 03/01/2022 INDICATION: Right pleural effusion. EXAM: ULTRASOUND GUIDED RIGHT THORACENTESIS MEDICATIONS: None. COMPLICATIONS: None immediate. PROCEDURE: An ultrasound guided thoracentesis was thoroughly discussed with the patient and questions answered. The benefits, risks, alternatives and complications were also discussed. The patient understands and wishes to proceed with the procedure. Written consent was obtained. Ultrasound was  performed to localize and mark an adequate pocket of fluid in the right chest. The area was then prepped and draped in the normal sterile fashion. 1% Lidocaine was used for local anesthesia. Under ultrasound guidance a 6 Fr Safe-T-Centesis catheter was introduced. Thoracentesis was performed. The catheter was removed and a dressing applied. FINDINGS: A total of approximately 1.3 L of dark yellow fluid was removed. Samples were sent to the laboratory as requested by the clinical team. IMPRESSION: Successful ultrasound guided right thoracentesis yielding 1.3 L of pleural fluid. Electronically Signed   By: Titus Dubin M.D.   On: 03/01/2022 11:23     aspirin EC  324 mg Oral Daily   calcium acetate  667 mg Oral TID WC   Chlorhexidine Gluconate Cloth  6 each Topical Q0600   doxazosin  4 mg Oral Daily   feeding supplement (NEPRO CARB STEADY)  237 mL Oral BID BM   heparin  5,000 Units Subcutaneous Q8H   hydrALAZINE  25 mg Oral TID   insulin aspart  0-5 Units Subcutaneous QHS   insulin aspart  0-9 Units Subcutaneous TID WC   isosorbide dinitrate  30 mg Oral TID   metoprolol tartrate  75 mg Oral BID   sertraline  100 mg Oral Daily    BMET    Component Value Date/Time   NA 135 03/03/2022 0612   NA 138 10/07/2021 1202   K 3.5 03/03/2022 0612   CL 100 03/03/2022 0612   CO2 24 03/03/2022 0612   GLUCOSE 227 (H) 03/03/2022 0612   BUN 43 (H) 03/03/2022 0612   BUN 42 (H) 10/07/2021 1202   CREATININE 4.41 (H) 03/03/2022 0612   CREATININE 1.94 (H)  05/13/2019 1029   CALCIUM 8.2 (L) 03/03/2022 0612   GFRNONAA 14 (L) 03/03/2022 0612   GFRAA 35 (L) 03/10/2020 1332   CBC    Component Value Date/Time   WBC 5.7 03/03/2022 0612   RBC 3.46 (L) 03/03/2022 0612   HGB 9.4 (L) 03/03/2022 0612   HGB 9.0 (L) 11/01/2021 1439   HCT 29.4 (L) 03/03/2022 0612   HCT 28.1 (L) 11/01/2021 1439   PLT 154 03/03/2022 0612   PLT 254 11/01/2021 1439   MCV 85.0 03/03/2022 0612   MCV 88 11/01/2021 1439   MCH 27.2  03/03/2022 0612   MCHC 32.0 03/03/2022 0612   RDW 17.6 (H) 03/03/2022 0612   RDW 15.9 (H) 11/01/2021 1439   LYMPHSABS 0.4 (L) 02/28/2022 1238   LYMPHSABS 0.9 10/07/2021 1202   MONOABS 0.1 02/28/2022 1238   EOSABS 0.0 02/28/2022 1238   EOSABS 0.2 10/07/2021 1202   BASOSABS 0.0 02/28/2022 1238   BASOSABS 0.1 10/07/2021 1202    Outpatient Dialysis Orders: MWF [facility] 4h  300/600  80kg  3k/2.5Ca bath  Heparin none  RIJ TDC (maturing LUA AVF created on 12/21/21) - last hep B labs: none - mircera 30 ug q2  - iron sucrose '100mg'$  q hd IV thru 8/21 (finished) - calcitriol 0.25 ug tiw po - last HD 9/18, post wt 82.7 kg (signed off 2 hours early on 9/18 and 1:45 early on 9/15)   Assessment/Plan:  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  Large right pleural effusion, s/p thoracentesis today with improved WOB.  Plan for serial HD to help UF and improve volume status as HR tolerates.  PSVT - cardiology following and to change coreg to lopressor for better rate control.  Started on IV amiodarone last night due to recurrence after HD. Will keep on 4K bath and UF as tolerated  ESRD -  had short session of HD 9/19 with 1.8L UF but followed by SVT and hypotension and give IV bolus of 500 mL.  Tolerated HD 9/20, will keep on MWF schedule  Hypertension/volume  - as above, Volume status improved  Anemia  - continue to follow H/H, redose ESA as needed.  Metabolic bone disease -   continue with home meds. PO4 WNL 9/21  Nutrition - renal diet, carb modified  Large right pleural effusion - s/p thoracentesis of 1.3 liters, clear dark yellow fluid removed 03/01/22.    Gean Quint, MD [facility]

## 2022-03-04 ENCOUNTER — Inpatient Hospital Stay (HOSPITAL_COMMUNITY): Payer: Medicare HMO

## 2022-03-04 DIAGNOSIS — R042 Hemoptysis: Secondary | ICD-10-CM

## 2022-03-04 DIAGNOSIS — J9601 Acute respiratory failure with hypoxia: Secondary | ICD-10-CM | POA: Diagnosis not present

## 2022-03-04 DIAGNOSIS — G9341 Metabolic encephalopathy: Secondary | ICD-10-CM | POA: Diagnosis not present

## 2022-03-04 DIAGNOSIS — R7881 Bacteremia: Secondary | ICD-10-CM | POA: Diagnosis not present

## 2022-03-04 DIAGNOSIS — I5043 Acute on chronic combined systolic (congestive) and diastolic (congestive) heart failure: Secondary | ICD-10-CM | POA: Diagnosis not present

## 2022-03-04 LAB — CBC WITH DIFFERENTIAL/PLATELET
Abs Immature Granulocytes: 0.05 10*3/uL (ref 0.00–0.07)
Basophils Absolute: 0 10*3/uL (ref 0.0–0.1)
Basophils Relative: 0 %
Eosinophils Absolute: 0.1 10*3/uL (ref 0.0–0.5)
Eosinophils Relative: 1 %
HCT: 26.8 % — ABNORMAL LOW (ref 39.0–52.0)
Hemoglobin: 8.5 g/dL — ABNORMAL LOW (ref 13.0–17.0)
Immature Granulocytes: 1 %
Lymphocytes Relative: 13 %
Lymphs Abs: 1 10*3/uL (ref 0.7–4.0)
MCH: 26.9 pg (ref 26.0–34.0)
MCHC: 31.7 g/dL (ref 30.0–36.0)
MCV: 84.8 fL (ref 80.0–100.0)
Monocytes Absolute: 1 10*3/uL (ref 0.1–1.0)
Monocytes Relative: 13 %
Neutro Abs: 5.4 10*3/uL (ref 1.7–7.7)
Neutrophils Relative %: 72 %
Platelets: 152 10*3/uL (ref 150–400)
RBC: 3.16 MIL/uL — ABNORMAL LOW (ref 4.22–5.81)
RDW: 17.5 % — ABNORMAL HIGH (ref 11.5–15.5)
WBC: 7.5 10*3/uL (ref 4.0–10.5)
nRBC: 0 % (ref 0.0–0.2)

## 2022-03-04 LAB — BASIC METABOLIC PANEL
Anion gap: 13 (ref 5–15)
BUN: 61 mg/dL — ABNORMAL HIGH (ref 8–23)
CO2: 23 mmol/L (ref 22–32)
Calcium: 8.2 mg/dL — ABNORMAL LOW (ref 8.9–10.3)
Chloride: 100 mmol/L (ref 98–111)
Creatinine, Ser: 5.6 mg/dL — ABNORMAL HIGH (ref 0.61–1.24)
GFR, Estimated: 11 mL/min — ABNORMAL LOW (ref >60)
Glucose, Bld: 151 mg/dL — ABNORMAL HIGH (ref 70–99)
Potassium: 3.9 mmol/L (ref 3.5–5.1)
Sodium: 136 mmol/L (ref 135–145)

## 2022-03-04 LAB — GLUCOSE, CAPILLARY
Glucose-Capillary: 131 mg/dL — ABNORMAL HIGH (ref 70–99)
Glucose-Capillary: 149 mg/dL — ABNORMAL HIGH (ref 70–99)
Glucose-Capillary: 114 mg/dL — ABNORMAL HIGH (ref 70–99)
Glucose-Capillary: 102 mg/dL — ABNORMAL HIGH (ref 70–99)
Glucose-Capillary: 176 mg/dL — ABNORMAL HIGH (ref 70–99)

## 2022-03-04 LAB — MAGNESIUM: Magnesium: 1.9 mg/dL (ref 1.7–2.4)

## 2022-03-04 LAB — LACTATE DEHYDROGENASE: LDH: 104 U/L (ref 98–192)

## 2022-03-04 MED ORDER — HEPARIN SODIUM (PORCINE) 1000 UNIT/ML IJ SOLN
INTRAMUSCULAR | Status: AC
  Administered 2022-03-04: 1000 [IU]
  Filled 2022-03-04: qty 4

## 2022-03-04 MED ORDER — DARBEPOETIN ALFA 60 MCG/0.3ML IJ SOSY
60.0000 ug | PREFILLED_SYRINGE | INTRAMUSCULAR | Status: DC
  Filled 2022-03-04: qty 0.3

## 2022-03-04 MED ORDER — SODIUM CHLORIDE 0.9 % IV SOLN
500.0000 mg | INTRAVENOUS | Status: DC
  Administered 2022-03-04 – 2022-03-07 (×4): 500 mg via INTRAVENOUS
  Filled 2022-03-04 (×5): qty 10

## 2022-03-04 MED ORDER — DARBEPOETIN ALFA 60 MCG/0.3ML IJ SOSY
PREFILLED_SYRINGE | INTRAMUSCULAR | Status: AC
  Administered 2022-03-04: 60 ug via INTRAVENOUS
  Filled 2022-03-04: qty 0.3

## 2022-03-04 NOTE — Progress Notes (Signed)
Patient is currently resting at this time and BIPAP continues to be on standby. RT spoke with nurse about pt and will apply BIPAP if needed.

## 2022-03-04 NOTE — Progress Notes (Addendum)
Patient ID: [name], male   DOB: Jan 13, 1956, 65 y.o.   MRN: [id number] S: started having hemoptysis yesterday afternoon. On meropenem now. He reports that he feels terrible. Nauseated now with sputum production. He denies having excessive fluid or worsening SOB at this time. Discussed with RN, has not had any episodes of hemoptysis or blood in sputum today. O:BP (!) 150/71   Pulse 73   Temp 98.3 F (36.8 C) (Axillary)   Resp 12   Ht '5\' 10"'$  (1.778 m)   Wt 80.6 kg   SpO2 99%   BMI 25.50 kg/m   Intake/Output Summary (Last 24 hours) at 03/04/2022 1101 Last data filed at 03/03/2022 1435 Gross per 24 hour  Intake 609.79 ml  Output --  Net 609.79 ml   Intake/Output: I/O last 3 completed shifts: In: 609.8 [I.V.:368.6; IV Piggyback:241.2] Out: 1800 [Other:1800]  Intake/Output this shift:  No intake/output data recorded. Weight change:  Gen: NAD CVS: RRR Resp: slightly diminished air entry bibasilar, no overt w/r/r/c Abd: soft, NT/ND Ext: no edema b/l LEs Neuro: awake, alert, following commands, speech clear and coherent Dialysis access: LUE AVF +b/t  Recent Labs  Lab 02/28/22 1238 03/01/22 0040 03/01/22 0437 03/01/22 2123 03/02/22 0238 03/03/22 0612 03/03/22 1415 03/04/22 0336  NA 138 135 137 134* 135 135  --  136  K 4.4 4.0 4.1 3.3* 3.7 3.5  --  3.9  CL 99 100 102 98 99 100  --  100  CO2 20* 21* '23 24 23 24  '$ --  23  GLUCOSE 176* 195* 183* 180* 194* 227*  --  151*  BUN 48* 56* 59* 49* 51* 43*  --  61*  CREATININE 4.47* 5.22* 5.38* 4.41* 4.76* 4.41*  --  5.60*  ALBUMIN 4.0  --   --   --  3.3* 3.3* 3.1*  --   CALCIUM 8.7* 8.1* 8.3* 8.0* 8.1* 8.2*  --  8.2*  PHOS  --  6.1*  --   --  4.3 2.6  --   --   AST 33  --   --   --   --   --  15  --   ALT 32  --   --   --   --   --  21  --    Liver Function Tests: Recent Labs  Lab 02/28/22 1238 03/02/22 0238 03/03/22 0612 03/03/22 1415  AST 33  --   --  15  ALT 32  --   --  21  ALKPHOS 135*  --   --  93  BILITOT 1.0   --   --  0.7  PROT 7.6  --   --  5.9*  ALBUMIN 4.0 3.3* 3.3* 3.1*   No results for input(s): "LIPASE", "AMYLASE" in the last 168 hours. Recent Labs  Lab 03/03/22 1415  AMMONIA 32   CBC: Recent Labs  Lab 02/28/22 1238 03/01/22 0437 03/02/22 0238 03/03/22 0612 03/03/22 1415 03/04/22 0336  WBC 8.8 7.0 6.3 5.7  --  7.5  NEUTROABS 8.2*  --   --   --   --  5.4  HGB 10.7* 9.2* 9.0* 9.4* 8.8* 8.5*  HCT 35.0* 29.2* 28.5* 29.4* 28.1* 26.8*  MCV 87.5 85.6 85.1 85.0  --  84.8  PLT 198 157 148* 154  --  152   Cardiac Enzymes: No results for input(s): "CKTOTAL", "CKMB", "CKMBINDEX", "TROPONINI" in the last 168 hours. CBG: Recent Labs  Lab 03/03/22 1117 03/03/22 1214 03/03/22 1617  03/03/22 1931 03/04/22 0802  GLUCAP 147* 151* 198* 168* 131*    Iron Studies: No results for input(s): "IRON", "TIBC", "TRANSFERRIN", "FERRITIN" in the last 72 hours. Studies/Results: [place] CHEST (PLEURAL EFFUSION)  Result Date: 03/04/2022 CLINICAL DATA:  Pleural effusion EXAM: CHEST ULTRASOUND COMPARISON:  Radiograph 03/03/2022 FINDINGS: There is a small right pleural effusion. IMPRESSION: Small right pleural effusion. Electronically Signed   By: Maurine Simmering M.D.   On: 03/04/2022 09:29   DG CHEST PORT 1 VIEW  Result Date: 03/03/2022 CLINICAL DATA:  Coughing up blood today EXAM: PORTABLE CHEST 1 VIEW COMPARISON:  Radiograph 03/01/2022 FINDINGS: Unchanged mild cardiomegaly. Prior median sternotomy and postsurgical changes of CABG. Right neck catheter tip overlies the right atrium. Central pulmonary vascular congestion. Stable small right pleural effusion. No evidence of pneumothorax. Bones are unchanged. IMPRESSION: Mild cardiomegaly and central pulmonary vascular congestion. Stable small right pleural effusion.  Bibasilar atelectasis. Electronically Signed   By: Maurine Simmering M.D.   On: 03/03/2022 16:41   CT ABDOMEN PELVIS WO CONTRAST  Result Date: 03/03/2022 CLINICAL DATA:  Abdominal pain, acute,  nonlocalized. Recent thoracentesis. Esophageal cancer. EXAM: CT ABDOMEN AND PELVIS WITHOUT CONTRAST TECHNIQUE: Multidetector CT imaging of the abdomen and pelvis was performed following the standard protocol without IV contrast. RADIATION DOSE REDUCTION: This exam was performed according to the departmental dose-optimization program which includes automated exposure control, adjustment of the mA and/or kV according to patient size and/or use of iterative reconstruction technique. COMPARISON:  CT of the abdomen and pelvis 01/23/2022. PET scan 02/10/2022. FINDINGS: Lower chest: Moderate size right pleural effusion is present. Mild dependent atelectasis is associated. There is no scratched at no pneumothorax is present. Dense coronary artery calcifications are present. No significant pericardial effusion is present. Hepatobiliary: No focal liver abnormality is seen. No gallstones, gallbladder wall thickening, or biliary dilatation. Pancreas: Unremarkable. No pancreatic ductal dilatation or surrounding inflammatory changes. Spleen: Normal in size without focal abnormality. Adrenals/Urinary Tract: Adrenal glands are within normal limits bilaterally. Stable bilateral simple cysts are present. Chronic stranding about both kidneys is stable. No other mass lesion or stone is present. No obstruction is present. Ureters are within normal limits. The urinary bladder is within normal limits. Stomach/Bowel: Stomach and duodenum are within normal limits. Small bowel is unremarkable. Terminal ileum is normal. The appendix is visualized and within normal limits. Loops of small bowel are present in the anterior right abdomen, likely representing adhesions. The ascending and transverse colon are within normal limits. Descending and sigmoid colon are normal. Vascular/Lymphatic: Atherosclerotic calcifications are present in the aorta and branch vessels. No aneurysm is present. Reproductive: Prostate measures 5.4 cm in transverse  diameter. Other: No abdominal wall hernia or abnormality. No abdominopelvic ascites. Musculoskeletal: Vertebral body heights and alignment are normal. No focal osseous abnormalities are present. Bony pelvis is within normal limits. Hips are located and normal. IMPRESSION: 1. Moderate size right pleural effusion and associated atelectasis. 2. No acute or focal lesion to explain the patient's abdominal pain. 3. Loops of small bowel in the anterior right abdomen, likely representing adhesions. No obstruction is associated. 4. Coronary artery disease. 5. Aortic Atherosclerosis (ICD10-I70.0). Electronically Signed   By: San Morelle M.D.   On: 03/03/2022 13:43   CT HEAD WO CONTRAST (5MM)  Result Date: 03/03/2022 CLINICAL DATA:  Altered mental status. EXAM: CT HEAD WITHOUT CONTRAST TECHNIQUE: Contiguous axial images were obtained from the base of the skull through the vertex without intravenous contrast. RADIATION DOSE REDUCTION: This exam was performed according to  the departmental dose-optimization program which includes automated exposure control, adjustment of the mA and/or kV according to patient size and/or use of iterative reconstruction technique. COMPARISON:  None Available. FINDINGS: Brain: Mild chronic ischemic white matter disease. No mass effect or midline shift is noted. Ventricular size is within normal limits. There is no evidence of mass lesion, hemorrhage or acute infarction. Vascular: No hyperdense vessel or unexpected calcification. Skull: Normal. Negative for fracture or focal lesion. Sinuses/Orbits: No acute finding. Other: None. IMPRESSION: No acute intracranial abnormality seen. Electronically Signed   By: Marijo Conception M.D.   On: 03/03/2022 13:31     aspirin EC  324 mg Oral Daily   calcium acetate  667 mg Oral TID WC   Chlorhexidine Gluconate Cloth  6 each Topical Q0600   doxazosin  4 mg Oral Daily   feeding supplement (NEPRO CARB STEADY)  237 mL Oral BID BM   heparin  5,000  Units Subcutaneous Q8H   hydrALAZINE  25 mg Oral TID   insulin aspart  0-5 Units Subcutaneous QHS   insulin aspart  0-9 Units Subcutaneous TID WC   isosorbide dinitrate  30 mg Oral TID   metoprolol tartrate  75 mg Oral BID   sertraline  100 mg Oral Daily    BMET    Component Value Date/Time   NA 136 03/04/2022 0336   NA 138 10/07/2021 1202   K 3.9 03/04/2022 0336   CL 100 03/04/2022 0336   CO2 23 03/04/2022 0336   GLUCOSE 151 (H) 03/04/2022 0336   BUN 61 (H) 03/04/2022 0336   BUN 42 (H) 10/07/2021 1202   CREATININE 5.60 (H) 03/04/2022 0336   CREATININE 1.94 (H) 05/13/2019 1029   CALCIUM 8.2 (L) 03/04/2022 0336   GFRNONAA 11 (L) 03/04/2022 0336   GFRAA 35 (L) 03/10/2020 1332   CBC    Component Value Date/Time   WBC 7.5 03/04/2022 0336   RBC 3.16 (L) 03/04/2022 0336   HGB 8.5 (L) 03/04/2022 0336   HGB 9.0 (L) 11/01/2021 1439   HCT 26.8 (L) 03/04/2022 0336   HCT 28.1 (L) 11/01/2021 1439   PLT 152 03/04/2022 0336   PLT 254 11/01/2021 1439   MCV 84.8 03/04/2022 0336   MCV 88 11/01/2021 1439   MCH 26.9 03/04/2022 0336   MCHC 31.7 03/04/2022 0336   RDW 17.5 (H) 03/04/2022 0336   RDW 15.9 (H) 11/01/2021 1439   LYMPHSABS 1.0 03/04/2022 0336   LYMPHSABS 0.9 10/07/2021 1202   MONOABS 1.0 03/04/2022 0336   EOSABS 0.1 03/04/2022 0336   EOSABS 0.2 10/07/2021 1202   BASOSABS 0.0 03/04/2022 0336   BASOSABS 0.1 10/07/2021 1202    Outpatient Dialysis Orders: MWF [facility] 4h  300/600  80kg  3k/2.5Ca bath  Heparin none  RIJ TDC (maturing LUA AVF created on 12/21/21) - last hep B labs: none - mircera 30 ug q2  - iron sucrose '100mg'$  q hd IV thru 8/21 (finished) - calcitriol 0.25 ug tiw po - last HD 9/18, post wt 82.7 kg (signed off 2 hours early on 9/18 and 1:45 early on 9/15)   Assessment/Plan:  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  Large right pleural effusion, s/p thoracentesis with improved WOB.   Will UF as tolerated. Volume status seems to be much better  PSVT - cardiology following and to change coreg to lopressor for better rate control.  Started on IV amiodarone  last night due to recurrence after HD. Will keep on 4K bath and UF as tolerated Hemoptysis: pulm consulted, per primary. No heparin with HD  ESRD -  had short session of HD 9/19 with 1.8L UF but followed by SVT and hypotension and give IV bolus of 500 mL.  Tolerated HD 9/20, will keep on MWF schedule, HD today  Hypertension/volume  - as above, Volume status improved  Anemia  - continue to follow H/H, will give a dose of ESA today  Metabolic bone disease -   continue with home meds. PO4 WNL 9/21  Nutrition - renal diet, carb modified  Large right pleural effusion - s/p thoracentesis of 1.3 liters, clear dark yellow fluid removed 03/01/22.    We will be available over the weekend if needed. Please call covering nephrologist with any questions/concerns.  Gean Quint, MD [facility]

## 2022-03-04 NOTE — Care Management Important Message (Signed)
Important Message  Patient Details  Name: [name] MRN: [id number] Date of Birth: Oct 22, 1955   Medicare Important Message Given:  Yes     Tommy Medal 03/04/2022, 11:49 AM

## 2022-03-04 NOTE — Progress Notes (Signed)
Rounding Note    Patient Name: [name] Date of Encounter: 03/04/2022  [place] Cardiologist: Skeet Latch, MD   Subjective   Patient remains nauseated   Breathing is OK    Inpatient Medications    Scheduled Meds:  aspirin EC  324 mg Oral Daily   calcium acetate  667 mg Oral TID WC   Chlorhexidine Gluconate Cloth  6 each Topical Q0600   darbepoetin (ARANESP) injection - DIALYSIS  60 mcg Intravenous Q Fri-HD   doxazosin  4 mg Oral Daily   feeding supplement (NEPRO CARB STEADY)  237 mL Oral BID BM   heparin  5,000 Units Subcutaneous Q8H   hydrALAZINE  25 mg Oral TID   insulin aspart  0-5 Units Subcutaneous QHS   insulin aspart  0-9 Units Subcutaneous TID WC   isosorbide dinitrate  30 mg Oral TID   metoprolol tartrate  75 mg Oral BID   sertraline  100 mg Oral Daily   Continuous Infusions:  amiodarone 30 mg/hr (03/04/22 0038)   meropenem (MERREM) IV     PRN Meds: hydrALAZINE, ondansetron **OR** ondansetron (ZOFRAN) IV, polyethylene glycol, prochlorperazine, traZODone   Vital Signs    Vitals:   03/04/22 0200 03/04/22 0300 03/04/22 0400 03/04/22 0500  BP: (!) 158/126 127/61 (!) 157/64 (!) 150/71  Pulse:    73  Resp: 16 (!) '4 19 12  '$ Temp:      TempSrc:      SpO2: 99% 99% 99% 99%  Weight:      Height:        Intake/Output Summary (Last 24 hours) at 03/04/2022 1112 Last data filed at 03/03/2022 1435 Gross per 24 hour  Intake 609.79 ml  Output --  Net 609.79 ml      03/02/2022    8:18 PM 03/02/2022    6:32 PM 03/02/2022    4:23 PM  Last 3 Weights  Weight (lbs) 177 lb 11.1 oz 181 lb 14.1 oz 177 lb 7.5 oz  Weight (kg) 80.6 kg 82.5 kg 80.5 kg      Telemetry   SR    Short burst of SVT (9 sec)     ECG    No new tracings.   Physical Exam   GEN: Appears uncomfortable laying in bed.  Neck: No JVD Cardiac: RRR, no murmurs   Respiratory: Clear to auscultation bilaterally without wheezing or rales. GI: Soft, nontender, non-distended   MS: No LE edema; No deformity. Neuro:  Nonfocal  Psych: Normal affect   Labs    High Sensitivity Troponin:   Recent Labs  Lab 02/28/22 1238 02/28/22 1532 03/01/22 0040  TROPONINIHS 51* 52* 53*     Chemistry Recent Labs  Lab 02/28/22 1238 03/01/22 0040 03/01/22 2123 03/02/22 0238 03/03/22 0612 03/03/22 1415 03/04/22 0336  NA 138   < > 134* 135 135  --  136  K 4.4   < > 3.3* 3.7 3.5  --  3.9  CL 99   < > 98 99 100  --  100  CO2 20*   < > '24 23 24  '$ --  23  GLUCOSE 176*   < > 180* 194* 227*  --  151*  BUN 48*   < > 49* 51* 43*  --  61*  CREATININE 4.47*   < > 4.41* 4.76* 4.41*  --  5.60*  CALCIUM 8.7*   < > 8.0* 8.1* 8.2*  --  8.2*  MG  --    < >  1.8 1.9  --   --  1.9  PROT 7.6  --   --   --   --  5.9*  --   ALBUMIN 4.0  --   --  3.3* 3.3* 3.1*  --   AST 33  --   --   --   --  15  --   ALT 32  --   --   --   --  21  --   ALKPHOS 135*  --   --   --   --  93  --   BILITOT 1.0  --   --   --   --  0.7  --   GFRNONAA 14*   < > 14* 13* 14*  --  11*  ANIONGAP 19*   < > '12 13 11  '$ --  13   < > = values in this interval not displayed.    Lipids No results for input(s): "CHOL", "TRIG", "HDL", "LABVLDL", "LDLCALC", "CHOLHDL" in the last 168 hours.  Hematology Recent Labs  Lab 03/02/22 0238 03/03/22 0612 03/03/22 1415 03/04/22 0336  WBC 6.3 5.7  --  7.5  RBC 3.35* 3.46*  --  3.16*  HGB 9.0* 9.4* 8.8* 8.5*  HCT 28.5* 29.4* 28.1* 26.8*  MCV 85.1 85.0  --  84.8  MCH 26.9 27.2  --  26.9  MCHC 31.6 32.0  --  31.7  RDW 17.3* 17.6*  --  17.5*  PLT 148* 154  --  152   Thyroid  Recent Labs  Lab 03/03/22 1415  TSH 2.367  FREET4 0.88    BNP Recent Labs  Lab 02/28/22 1249  BNP >4,500.0*    DDimer No results for input(s): "DDIMER" in the last 168 hours.   Radiology     Cardiac Studies   Cardiac Catheterization: 01/2022 Mid LM to Dist LM lesion is 25% stenosed.   Ost LAD to Prox LAD lesion is 50% stenosed.   Ost Cx to Prox Cx lesion is 90% stenosed.   Dist  RCA-1 lesion is 90% stenosed.   Mid RCA lesion is 75% stenosed.   1st Diag lesion is 80% stenosed.   2nd Diag lesion is 75% stenosed.   1st Mrg lesion is 80% stenosed.   2nd Mrg lesion is 80% stenosed.   Mid LAD lesion is 100% stenosed.   RPAV lesion is 80% stenosed.   Dist RCA-2 lesion is 100% stenosed.   SVG graft was visualized by angiography and is normal in caliber.   SVG graft was visualized by angiography and is normal in caliber.   LIMA graft was visualized by angiography and is large.   SVG graft was visualized by angiography and is normal in caliber.   The graft exhibits no disease.   The graft exhibits no disease.   The graft exhibits no disease.   The graft exhibits no disease.   LV end diastolic pressure is mildly elevated.   Severe 3 vessel obstructive CAD Patent LIMA to the LAD Patent SVG to large second diagonal Patent SVG to OM Patent SVG to PL branch of the RCA Elevated LVEDP 25 mm Hg   Plan: continue medical management.  Echocardiogram: 02/2022 IMPRESSIONS     1. Left ventricular ejection fraction, by estimation, is 40 to 45%. The  left ventricle has mildly decreased function. The left ventricle  demonstrates global hypokinesis. There is moderate left ventricular  hypertrophy. Left ventricular diastolic  parameters are indeterminate.   2. Ventricular septum  is flattened in systole consistent with RV pressure  overload. . Right ventricular systolic function is moderately reduced. The  right ventricular size is severely enlarged. There is mildly elevated  pulmonary artery systolic  pressure.   3. Left atrial size was severely dilated.   4. Catheter tip noted in RA. Right atrial size was severely dilated.   5. The mitral valve is normal in structure. No evidence of mitral valve  regurgitation. No evidence of mitral stenosis.   6. The tricuspid valve is abnormal.   7. The aortic valve is tricuspid. There is mild calcification of the  aortic valve. There  is mild thickening of the aortic valve. Aortic valve  regurgitation is not visualized. No aortic stenosis is present.   8. The inferior vena cava is dilated in size with >50% respiratory  variability, suggesting right atrial pressure of 8 mmHg.    Patient Profile     66 y.o. 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 Cardiology consulted due to SVT.   Assessment & Plan    1. Acute HFrEF - Recent echo showed his EF was at 40-45% as outlined above and catheterization in 01/2022 showed patent bypass grafts.  - He has been continued on Lopressor '75mg'$  BID, Isordil '30mg'$  TID and Hydralazine '25mg'$  TID. Not on ACE-I/ARB/ARNI/SGLT2 inhibitor given his ESRD. Volume status appears OK   Managed by HD    2. SVT/NSVT - He had multiple episodes despite switching from Coreg to Lopressor, therefore he was ultimately switched to IV Amiodarone on 03/01/2022. Has remained on Lopressor '75mg'$  BID as well.   SHort burst of SVT   9 sec   Better than yesterday     Keep amiodarone IV given N/      He had N/ prior to starting   amio so I am not convinced causing or exacerbating it    When taking po better would switch to 400 bid while here then 200 bid   3. CAD - He is s/p CABG in 03/2019 with patent grafts by recent cath in 01/2022. Remains on ASA and beta-blocker therapy.   4  HL   Intolerant to statins.  On Repatha as an outpatient.   5  ESRD/Right Pleural Effusion - Being followed by Nephrology Undergoing HD  For questions or updates, please contact [place] Please consult [email or web address] for contact info under        Signed, Dorris Carnes, MD  03/04/2022, 11:12 AM

## 2022-03-04 NOTE — Progress Notes (Addendum)
PROGRESS NOTE  [name] TZG:[id number] DOB: 1956/02/02 DOA: 02/28/2022 PCP: [name], MD  Brief History:  66 y.o. male with medical history significant for CABG, ESRD, cardiomyopathy with systolic and diastolic CHF, stroke, hypertension, diabetes mellitus. Patient was brought to the ED from dialysis center with reports of worsening of his difficulty breathing.  At the time of my evaluation, patient is on BiPAP. Patient was having dialysis today and was 1 and 54 minutes into session when it had to be stopped.  He was having significant worsening of his breathing, with use of accessory muscle, was placed on 4 L nasal cannula, and BiPAP here. Patient reports has been having difficulty breathing over the past 2 days.  He reported cough productive of whitish phlegm.  On arrival to the ED there was some wheezing.   Recent hospitalization 8/19 to 8/25 for chest pain, transferred to [facility], due to history of previous CABG.  Underwent cardiac cath 8/22 which showed patent CABG.   ED Course: Temperature 98.9.  Initial heart rate 103 improved to 70s, respiratory rate initially up to 30 improved after BiPAP, blood pressure 120s to 170s.  O2 sats 90% on 4 L nasal cannula, subsequently switched to BiPAP due to tachycardia, tachypnea, accessory muscle use and increased work of breathing. Chest x-ray with mild cardiomegaly without overt pulmonary edema, small right pleural effusion slightly increased, hazy right lung base opacity favor atelectasis. VBG with pH of 7.3, PCO2 of 43.  Lactic acid 3.5.  Troponin 51.   IV ceftriaxone and azithromycin started. Initial concern for flash pulmonary edema, hence nitroglycerin drip initially started and later discontinued with improvement in respiratory status. However, the patient was noted to have gram-negative bacteremia, and he was started on cefepime empirically.  On the morning of 03/03/2022, the patient was a little more somnolent and  confused.  However, he woke up to voice and was able to answer questions appropriately when he was awake.  Work-up was undertaken to clarify etiology of his altered mental status.    Assessment and Plan: * Acute respiratory failure with hypoxia (HCC) Difficulty breathing over the past 2 days PTA -placed on BiPAP due to increased work of breathing.   -Respiratory status has improved after thora (1.3L removed).  Quit smoking cigarettes about 33 years ago. . -Started on BiPAP and nitroglycerin drip which was discontinued after improvement in respiratory status. -Hold off on further antibiotics at this time -Obtain CTA chest, I talked to nephrologist Dr. [name], okay to administer IV contrast at this time -Lactic acid 3.5 > 1.9 resolved without intervention, likely secondary to hypoxia, doubt infection. -Addendum-no PE, shows large right pleural effusion increased in size from prior.  Likely etiology for his hypoxia.  -ordered ultrasound-guided thoracentesis>>1.3L -now stable on RA  DM type 2 causing vascular disease (HCC) A1c 7.7.  Glucose 176. - continue SSI   Essential hypertension, benign Systolic 759F to 638G. -Resume doxazosin, Imdur, metoprolol  -hydralazine restarted by cardiology team 6/65  Acute metabolic encephalopathy 9/93/5701 AM--patient is somnolent but arouses to voice and answering questions appropriately -CT brain--neg -Ammonia--32 -CBG 151 -Check VBG 7.5/35/59/27 RA B12--564 -Discontinue any hypnotic medications -May be related to cefepime>>switch to [name]  Acute on chronic combined systolic and diastolic CHF (congestive heart failure) ([place]) 07/25/21 Echo--EF 40-45%, no WMA, mod RV dysfx, PASP 56.5 - BNP >4500, large right effusion on CT - transiently on NG gtt in setting of HTN now off.  -  fluid management per HD, defer to neprhology 03/01/22 Echo--EF 40-45%, global HK, RV overload -03/02/2022 UF 1.8L 03/04/22-HD Volume overload status improving  SVT  (supraventricular tachycardia) ([place]) --Pt found unresponsive in sustained SVT on 9/18, resolved after vagal maneuvers -- appreciate cardiology -continued to have SVT 9/20 evening, one episode 9/21 evening -switched coreg to metoprolol -started amiodarone drip>>continue  -continue metoprolol 75 mg bid  Pleural effusion on right -- likely major contributor to his dyspneic symptoms -- 9/19 thora>>1.3L--appears exudative by Light's criteria -cytology negative -repeat attempt 03/04/22>> not enough fluid  Hemoptysis 03/03/22 mod amounts Appreciate pulm consult Appears resolved May be related to aspiration/pulm HTN Monitor H/H -stable on RA  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 [name] --considered poor candidate for esophagectomy. After multidisciplinary discussion the plan was to continue with endoscopic treatments, and continue to follow-up with oncology for surveillance and any signs of progression of disease -status post EMR, cryotherapy, and most recently RFA in April 2023 -02/10/22 PET scan--no local recurrence   DNR (do not resuscitate) --continue DNR order while in hospital   gram neg rod Bacteremia -E. [place] -Due to altered mental status, change antibiotics to merrem -repeat blood culture neg to date  ESRD on dialysis [facility]) Started dialysis within the past month.  Schedule Monday Wednesday Friday.  HD session terminated today due to sudden worsening of his dyspnea.  I talked with Dr. Colodonato>>, okay with IV contrast for now. -nephrology consulted to manage inpatient HD -03/02/22 UF 1.8L -HD 03/04/22   Obstructive sleep apnea BIPAP QHS while hospitalized.  S/P CABG (coronary artery bypass graft) Recent cardiac cath 8/22, showed patent grafts, severe three-vessel native CAD.Marland Kitchen  History of CABG 2020.  Troponin today 51 > 52, not consistent with ACS -Resume aspirin, metoprolol, Imdur.   -No chest pain -03/01/2022 echo EF 40-45%, global HK, mild TR,  RV overload       Family Communication:   significant other at bedside 9/22   Consultants:  renal, cardiology   Code Status:  FULL  DVT Prophylaxis:  [place] Heparin      Procedures: As Listed in Progress Note Above   Antibiotics: Cefepime 9/19>>9/21 Merrem 9/21>>        Subjective: Patient denies fevers, chills, headache, chest pain, dyspnea, vomiting, diarrhea, abdominal pain, dysuria, hematuria, hematochezia, and melena. Complains of nausea.  No further hemoptysis.    Objective: Vitals:   03/04/22 1500 03/04/22 1510 03/04/22 1512 03/04/22 1515  BP: (!) 150/51 (!) 150/51  132/61  Pulse: 74 72  72  Resp: '19 20  13  '$ Temp:  98.1 F (36.7 C)    TempSrc:  Oral    SpO2: 99% 100%  98%  Weight:   83 kg   Height:       No intake or output data in the 24 hours ending 03/04/22 1521 Weight change:  Exam:  General:  Pt is alert, follows commands appropriately, not in acute distress HEENT: No icterus, No thrush, No neck mass, [place]/AT Cardiovascular: RRR, S1/S2, no rubs, no gallops Respiratory: bibasilar rales. No wheeze Abdomen: Soft/+BS, non tender, non distended, no guarding Extremities: No edema, No lymphangitis, No petechiae, No rashes, no synovitis   Data Reviewed: I have personally reviewed following labs and imaging studies Basic Metabolic Panel: Recent Labs  Lab 03/01/22 0040 03/01/22 0437 03/01/22 2123 03/02/22 0238 03/03/22 0612 03/04/22 0336  NA 135 137 134* 135 135 136  K 4.0 4.1 3.3* 3.7 3.5 3.9  CL 100 102 98 99 100 100  CO2 21* '23 24 23 24 23  '$ GLUCOSE 195* 183* 180* 194* 227* 151*  BUN 56* 59* 49* 51* 43* 61*  CREATININE 5.22* 5.38* 4.41* 4.76* 4.41* 5.60*  CALCIUM 8.1* 8.3* 8.0* 8.1* 8.2* 8.2*  MG 2.1  --  1.8 1.9  --  1.9  PHOS 6.1*  --   --  4.3 2.6  --    Liver Function Tests: Recent Labs  Lab 02/28/22 1238 03/02/22 0238 03/03/22 0612 03/03/22 1415  AST 33  --   --  15  ALT 32  --   --  21  ALKPHOS 135*  --   --  93  BILITOT 1.0   --   --  0.7  PROT 7.6  --   --  5.9*  ALBUMIN 4.0 3.3* 3.3* 3.1*   No results for input(s): "LIPASE", "AMYLASE" in the last 168 hours. Recent Labs  Lab 03/03/22 1415  AMMONIA 32   Coagulation Profile: Recent Labs  Lab 02/28/22 1238  INR 1.4*   CBC: Recent Labs  Lab 02/28/22 1238 03/01/22 0437 03/02/22 0238 03/03/22 0612 03/03/22 1415 03/04/22 0336  WBC 8.8 7.0 6.3 5.7  --  7.5  NEUTROABS 8.2*  --   --   --   --  5.4  HGB 10.7* 9.2* 9.0* 9.4* 8.8* 8.5*  HCT 35.0* 29.2* 28.5* 29.4* 28.1* 26.8*  MCV 87.5 85.6 85.1 85.0  --  84.8  PLT 198 157 148* 154  --  152   Cardiac Enzymes: No results for input(s): "CKTOTAL", "CKMB", "CKMBINDEX", "TROPONINI" in the last 168 hours. BNP: Invalid input(s): "POCBNP" CBG: Recent Labs  Lab 03/03/22 1214 03/03/22 1617 03/03/22 1931 03/04/22 0802 03/04/22 1150  GLUCAP 151* 198* 168* 131* 149*   HbA1C: No results for input(s): "HGBA1C" in the last 72 hours. Urine analysis:    Component Value Date/Time   COLORURINE YELLOW 02/28/2022 1414   APPEARANCEUR CLEAR 02/28/2022 1414   LABSPEC 1.014 02/28/2022 1414   PHURINE 5.0 02/28/2022 1414   GLUCOSEU >=500 (A) 02/28/2022 1414   HGBUR NEGATIVE 02/28/2022 1414   BILIRUBINUR NEGATIVE 02/28/2022 1414   KETONESUR NEGATIVE 02/28/2022 1414   PROTEINUR >=300 (A) 02/28/2022 1414   NITRITE NEGATIVE 02/28/2022 1414   LEUKOCYTESUR NEGATIVE 02/28/2022 1414   Sepsis Labs: '@LABRCNTIP'$ (procalcitonin:4,lacticidven:4) ) Recent Results (from the past 240 hour(s))  Culture, blood (Routine x 2)     Status: None (Preliminary result)   Collection Time: 02/28/22 12:38 PM   Specimen: BLOOD RIGHT FOREARM  Result Value Ref Range Status   Specimen Description   Final    BLOOD RIGHT FOREARM BOTTLES DRAWN AEROBIC AND ANAEROBIC   Special Requests Blood Culture adequate volume  Final   Culture   Final    NO GROWTH 3 DAYS Performed at [facility], [address], [place], [place] [address]    Report  Status PENDING  Incomplete  Culture, blood (Routine x 2)     Status: Abnormal   Collection Time: 02/28/22 12:43 PM   Specimen: Right Antecubital; Blood  Result Value Ref Range Status   Specimen Description   Final    RIGHT ANTECUBITAL BOTTLES DRAWN AEROBIC AND ANAEROBIC Performed at [facility], [address] [place], [place] [address]    Special Requests   Final    Blood Culture adequate volume Performed at [facility], [address], [place], [place] [address]    Culture  Setup Time   Final    GRAM NEGATIVE RODS Gram Stain Report Called to,Read  Back By and Verified With: L HILTON RN [phone] K FORSYTH IN BOTH AEROBIC AND ANAEROBIC BOTTLES CRITICAL RESULT CALLED TO, READ BACK BY AND VERIFIED WITH: Cloyde Reams [id number] '@1700'$  FH Performed at [facility], [address] [place], [place] [address]    Culture ENTEROBACTER CLOACAE (A)  Final   Report Status 03/03/2022 FINAL  Final   Organism ID, Bacteria ENTEROBACTER CLOACAE  Final      Susceptibility   Enterobacter cloacae - MIC*    CEFAZOLIN >=64 RESISTANT Resistant     CEFEPIME <=0.12 SENSITIVE Sensitive     CEFTAZIDIME <=1 SENSITIVE Sensitive     CIPROFLOXACIN <=0.25 SENSITIVE Sensitive     GENTAMICIN <=1 SENSITIVE Sensitive     IMIPENEM 0.5 SENSITIVE Sensitive     TRIMETH/SULFA <=20 SENSITIVE Sensitive     PIP/TAZO <=4 SENSITIVE Sensitive     * ENTEROBACTER CLOACAE  Blood Culture ID Panel (Reflexed)     Status: Abnormal   Collection Time: 02/28/22 12:43 PM  Result Value Ref Range Status   Enterococcus faecalis NOT DETECTED NOT DETECTED Final   Enterococcus Faecium NOT DETECTED NOT DETECTED Final   Listeria monocytogenes NOT DETECTED NOT DETECTED Final   Staphylococcus species NOT DETECTED NOT DETECTED Final   Staphylococcus aureus (BCID) NOT DETECTED NOT DETECTED Final   Staphylococcus epidermidis NOT DETECTED NOT DETECTED Final   Staphylococcus lugdunensis NOT DETECTED NOT DETECTED Final   Streptococcus species  NOT DETECTED NOT DETECTED Final   Streptococcus agalactiae NOT DETECTED NOT DETECTED Final   Streptococcus pneumoniae NOT DETECTED NOT DETECTED Final   Streptococcus pyogenes NOT DETECTED NOT DETECTED Final   A.calcoaceticus-baumannii NOT DETECTED NOT DETECTED Final   Bacteroides fragilis NOT DETECTED NOT DETECTED Final   Enterobacterales DETECTED (A) NOT DETECTED Final    Comment: Enterobacterales represent a large order of gram negative bacteria, not a single organism. CRITICAL RESULT CALLED TO, READ BACK BY AND VERIFIED WITH: PHARMD S. HURTH [id number] '@1700'$  FH    Enterobacter cloacae complex DETECTED (A) NOT DETECTED Final    Comment: CRITICAL RESULT CALLED TO, READ BACK BY AND VERIFIED WITH: Cloyde Reams [id number] '@1700'$  FH    Escherichia coli NOT DETECTED NOT DETECTED Final   Klebsiella aerogenes NOT DETECTED NOT DETECTED Final   Klebsiella oxytoca NOT DETECTED NOT DETECTED Final   Klebsiella pneumoniae NOT DETECTED NOT DETECTED Final   Proteus species NOT DETECTED NOT DETECTED Final   Salmonella species NOT DETECTED NOT DETECTED Final   Serratia marcescens NOT DETECTED NOT DETECTED Final   Haemophilus influenzae NOT DETECTED NOT DETECTED Final   Neisseria meningitidis NOT DETECTED NOT DETECTED Final   Pseudomonas aeruginosa NOT DETECTED NOT DETECTED Final   Stenotrophomonas maltophilia NOT DETECTED NOT DETECTED Final   Candida albicans NOT DETECTED NOT DETECTED Final   Candida auris NOT DETECTED NOT DETECTED Final   Candida glabrata NOT DETECTED NOT DETECTED Final   Candida krusei NOT DETECTED NOT DETECTED Final   Candida parapsilosis NOT DETECTED NOT DETECTED Final   Candida tropicalis NOT DETECTED NOT DETECTED Final   Cryptococcus neoformans/gattii NOT DETECTED NOT DETECTED Final   CTX-M ESBL NOT DETECTED NOT DETECTED Final   Carbapenem resistance IMP NOT DETECTED NOT DETECTED Final   Carbapenem resistance KPC NOT DETECTED NOT DETECTED Final   Carbapenem resistance NDM NOT  DETECTED NOT DETECTED Final   Carbapenem resist OXA 48 LIKE NOT DETECTED NOT DETECTED Final   Carbapenem resistance VIM NOT DETECTED NOT DETECTED Final    Comment: Performed at [facility], [place] [address], [place], [place] [address]  MRSA Next Gen by PCR, Nasal     Status: None   Collection Time: 02/28/22 10:20 PM   Specimen: Nasal Mucosa; Nasal Swab  Result Value Ref Range Status   MRSA by PCR Next Gen NOT DETECTED NOT DETECTED Final    Comment: (NOTE) The GeneXpert MRSA Assay (FDA approved for NASAL specimens only), is one component of a comprehensive MRSA colonization surveillance program. It is not intended to diagnose MRSA infection nor to guide or monitor treatment for MRSA infections. Test performance is not FDA approved in patients less than 32 years old. Performed at [facility], [address], [place], [place] [address]   Gram stain     Status: None   Collection Time: 03/01/22 10:20 AM   Specimen: Pleura  Result Value Ref Range Status   Specimen Description PLEURAL  Final   Special Requests PLEURAL  Final   Gram Stain   Final    NO ORGANISMS SEEN WBC PRESENT,BOTH PMN AND MONONUCLEAR CYTOSPIN SMEAR Performed at [facility], [address], [place], [place] [address]    Report Status 03/01/2022 FINAL  Final  Culture, body fluid w Gram Stain-bottle     Status: None (Preliminary result)   Collection Time: 03/01/22 10:20 AM   Specimen: Pleura  Result Value Ref Range Status   Specimen Description PLEURAL  Final   Special Requests   Final    BOTTLES DRAWN AEROBIC AND ANAEROBIC Blood Culture adequate volume   Culture   Final    NO GROWTH 2 DAYS Performed at [facility], [address], [place], [place] [address]    Report Status PENDING  Incomplete  Culture, blood (Routine X 2) w Reflex to ID Panel     Status: None (Preliminary result)   Collection Time: 03/03/22  7:56 PM   Specimen: BLOOD RIGHT HAND  Result Value Ref Range Status   Specimen Description BLOOD  RIGHT HAND  Final   Special Requests   Final    BOTTLES DRAWN AEROBIC AND ANAEROBIC Blood Culture adequate volume Performed at [facility], [address], [place], [place] [address]    Culture PENDING  Incomplete   Report Status PENDING  Incomplete  Culture, blood (Routine X 2) w Reflex to ID Panel     Status: None (Preliminary result)   Collection Time: 03/03/22  7:56 PM   Specimen: BLOOD RIGHT HAND  Result Value Ref Range Status   Specimen Description BLOOD RIGHT HAND  Final   Special Requests   Final    BOTTLES DRAWN AEROBIC AND ANAEROBIC Blood Culture adequate volume Performed at [facility], [address], [place], [place] [address]    Culture PENDING  Incomplete   Report Status PENDING  Incomplete     Scheduled Meds:  aspirin EC  324 mg Oral Daily   calcium acetate  667 mg Oral TID WC   Chlorhexidine Gluconate Cloth  6 each Topical Q0600   darbepoetin (ARANESP) injection - DIALYSIS  60 mcg Intravenous Q Fri-HD   doxazosin  4 mg Oral Daily   feeding supplement (NEPRO CARB STEADY)  237 mL Oral BID BM   heparin  5,000 Units Subcutaneous Q8H   hydrALAZINE  25 mg Oral TID   insulin aspart  0-5 Units Subcutaneous QHS   insulin aspart  0-9 Units Subcutaneous TID WC   isosorbide dinitrate  30 mg Oral TID   metoprolol tartrate  75 mg Oral BID  sertraline  100 mg Oral Daily   Continuous Infusions:  amiodarone 30 mg/hr (03/04/22 0038)   meropenem (MERREM) IV      Procedures/Studies: [place] CHEST (PLEURAL EFFUSION)  Result Date: 03/04/2022 CLINICAL DATA:  Pleural effusion EXAM: CHEST ULTRASOUND COMPARISON:  Radiograph 03/03/2022 FINDINGS: There is a small right pleural effusion. IMPRESSION: Small right pleural effusion. Electronically Signed   By: Maurine Simmering M.D.   On: 03/04/2022 09:29   DG CHEST PORT 1 VIEW  Result Date: 03/03/2022 CLINICAL DATA:  Coughing up blood today EXAM: PORTABLE CHEST 1 VIEW COMPARISON:  Radiograph 03/01/2022 FINDINGS: Unchanged mild cardiomegaly.  Prior median sternotomy and postsurgical changes of CABG. Right neck catheter tip overlies the right atrium. Central pulmonary vascular congestion. Stable small right pleural effusion. No evidence of pneumothorax. Bones are unchanged. IMPRESSION: Mild cardiomegaly and central pulmonary vascular congestion. Stable small right pleural effusion.  Bibasilar atelectasis. Electronically Signed   By: Maurine Simmering M.D.   On: 03/03/2022 16:41   CT ABDOMEN PELVIS WO CONTRAST  Result Date: 03/03/2022 CLINICAL DATA:  Abdominal pain, acute, nonlocalized. Recent thoracentesis. Esophageal cancer. EXAM: CT ABDOMEN AND PELVIS WITHOUT CONTRAST TECHNIQUE: Multidetector CT imaging of the abdomen and pelvis was performed following the standard protocol without IV contrast. RADIATION DOSE REDUCTION: This exam was performed according to the departmental dose-optimization program which includes automated exposure control, adjustment of the mA and/or kV according to patient size and/or use of iterative reconstruction technique. COMPARISON:  CT of the abdomen and pelvis 01/23/2022. PET scan 02/10/2022. FINDINGS: Lower chest: Moderate size right pleural effusion is present. Mild dependent atelectasis is associated. There is no scratched at no pneumothorax is present. Dense coronary artery calcifications are present. No significant pericardial effusion is present. Hepatobiliary: No focal liver abnormality is seen. No gallstones, gallbladder wall thickening, or biliary dilatation. Pancreas: Unremarkable. No pancreatic ductal dilatation or surrounding inflammatory changes. Spleen: Normal in size without focal abnormality. Adrenals/Urinary Tract: Adrenal glands are within normal limits bilaterally. Stable bilateral simple cysts are present. Chronic stranding about both kidneys is stable. No other mass lesion or stone is present. No obstruction is present. Ureters are within normal limits. The urinary bladder is within normal limits.  Stomach/Bowel: Stomach and duodenum are within normal limits. Small bowel is unremarkable. Terminal ileum is normal. The appendix is visualized and within normal limits. Loops of small bowel are present in the anterior right abdomen, likely representing adhesions. The ascending and transverse colon are within normal limits. Descending and sigmoid colon are normal. Vascular/Lymphatic: Atherosclerotic calcifications are present in the aorta and branch vessels. No aneurysm is present. Reproductive: Prostate measures 5.4 cm in transverse diameter. Other: No abdominal wall hernia or abnormality. No abdominopelvic ascites. Musculoskeletal: Vertebral body heights and alignment are normal. No focal osseous abnormalities are present. Bony pelvis is within normal limits. Hips are located and normal. IMPRESSION: 1. Moderate size right pleural effusion and associated atelectasis. 2. No acute or focal lesion to explain the patient's abdominal pain. 3. Loops of small bowel in the anterior right abdomen, likely representing adhesions. No obstruction is associated. 4. Coronary artery disease. 5. Aortic Atherosclerosis (ICD10-I70.0). Electronically Signed   By: San Morelle M.D.   On: 03/03/2022 13:43   CT HEAD WO CONTRAST (5MM)  Result Date: 03/03/2022 CLINICAL DATA:  Altered mental status. EXAM: CT HEAD WITHOUT CONTRAST TECHNIQUE: Contiguous axial images were obtained from the base of the skull through the vertex without intravenous contrast. RADIATION DOSE REDUCTION: This exam was performed according to the departmental dose-optimization  program which includes automated exposure control, adjustment of the mA and/or kV according to patient size and/or use of iterative reconstruction technique. COMPARISON:  None Available. FINDINGS: Brain: Mild chronic ischemic white matter disease. No mass effect or midline shift is noted. Ventricular size is within normal limits. There is no evidence of mass lesion, hemorrhage or  acute infarction. Vascular: No hyperdense vessel or unexpected calcification. Skull: Normal. Negative for fracture or focal lesion. Sinuses/Orbits: No acute finding. Other: None. IMPRESSION: No acute intracranial abnormality seen. Electronically Signed   By: Marijo Conception M.D.   On: 03/03/2022 13:31   ECHOCARDIOGRAM COMPLETE  Result Date: 03/01/2022    ECHOCARDIOGRAM REPORT   Patient Name:   [name] Date of Exam: 03/01/2022 Medical Rec #:  [id number]        Height:       70.0 in Accession #:    [id number]       Weight:       181.4 lb Date of Birth:  10-Mar-1956        BSA:          2.003 m Patient Age:    65 years         BP:           131/55 mmHg Patient Gender: M                HR:           69 bpm. Exam Location:  [facility] Procedure: 2D Echo, Cardiac Doppler and Color Doppler Indications:    Dyspnea  History:        Patient has prior history of Echocardiogram examinations, most                 recent 07/25/2021. CHF and Cardiomyopathy, CAD, Prior CABG,                 Stroke and PAD, Arrythmias:Tachycardia, Signs/Symptoms:Shortness                 of Breath, Edema and Chest Pain; Risk Factors:Hypertension,                 Diabetes and Dyslipidemia.  Sonographer:    Wenda Low Referring Phys: [id number] [place]  1. Left ventricular ejection fraction, by estimation, is 40 to 45%. The left ventricle has mildly decreased function. The left ventricle demonstrates global hypokinesis. There is moderate left ventricular hypertrophy. Left ventricular diastolic parameters are indeterminate.  2. Ventricular septum is flattened in systole consistent with RV pressure overload. . Right ventricular systolic function is moderately reduced. The right ventricular size is severely enlarged. There is mildly elevated pulmonary artery systolic pressure.  3. Left atrial size was severely dilated.  4. Catheter tip noted in RA. Right atrial size was severely dilated.  5. The mitral valve is normal in  structure. No evidence of mitral valve regurgitation. No evidence of mitral stenosis.  6. The tricuspid valve is abnormal.  7. The aortic valve is tricuspid. There is mild calcification of the aortic valve. There is mild thickening of the aortic valve. Aortic valve regurgitation is not visualized. No aortic stenosis is present.  8. The inferior vena cava is dilated in size with >50% respiratory variability, suggesting right atrial pressure of 8 mmHg. FINDINGS  Left Ventricle: Left ventricular ejection fraction, by estimation, is 40 to 45%. The left ventricle has mildly decreased function. The left ventricle demonstrates global hypokinesis. The left ventricular internal cavity size  was normal in size. There is  moderate left ventricular hypertrophy. Left ventricular diastolic parameters are indeterminate. Right Ventricle: Ventricular septum is flattened in systole consistent with RV pressure overload. The right ventricular size is severely enlarged. Right vetricular wall thickness was not well visualized. Right ventricular systolic function is moderately reduced. There is mildly elevated pulmonary artery systolic pressure. The tricuspid regurgitant velocity is 2.91 m/s, and with an assumed right atrial pressure of 8 mmHg, the estimated right ventricular systolic pressure is 72.5 mmHg. Left Atrium: Left atrial size was severely dilated. Right Atrium: Catheter tip noted in RA. Right atrial size was severely dilated. Pericardium: There is no evidence of pericardial effusion. Mitral Valve: The mitral valve is normal in structure. No evidence of mitral valve regurgitation. No evidence of mitral valve stenosis. MV peak gradient, 4.2 mmHg. The mean mitral valve gradient is 2.0 mmHg. Tricuspid Valve: The tricuspid valve is abnormal. Tricuspid valve regurgitation is mild . No evidence of tricuspid stenosis. Aortic Valve: The aortic valve is tricuspid. There is mild calcification of the aortic valve. There is mild thickening  of the aortic valve. There is mild aortic valve annular calcification. Aortic valve regurgitation is not visualized. No aortic stenosis  is present. Aortic valve mean gradient measures 3.5 mmHg. Aortic valve peak gradient measures 7.2 mmHg. Aortic valve area, by VTI measures 2.94 cm. Pulmonic Valve: The pulmonic valve was not well visualized. Pulmonic valve regurgitation is not visualized. No evidence of pulmonic stenosis. Aorta: The aortic root is normal in size and structure. Venous: The inferior vena cava is dilated in size with greater than 50% respiratory variability, suggesting right atrial pressure of 8 mmHg. IAS/Shunts: No atrial level shunt detected by color flow Doppler.  LEFT VENTRICLE PLAX 2D LVIDd:         5.40 cm      Diastology LVIDs:         4.30 cm      LV e' medial:    5.87 cm/s LV PW:         1.40 cm      LV E/e' medial:  14.5 LV IVS:        1.20 cm      LV e' lateral:   11.70 cm/s LVOT diam:     2.10 cm      LV E/e' lateral: 7.3 LV SV:         81 LV SV Index:   41 LVOT Area:     3.46 cm  LV Volumes (MOD) LV vol d, MOD A2C: 118.0 ml LV vol d, MOD A4C: 98.3 ml LV vol s, MOD A2C: 85.2 ml LV vol s, MOD A4C: 48.6 ml LV SV MOD A2C:     32.8 ml LV SV MOD A4C:     98.3 ml LV SV MOD BP:      39.3 ml RIGHT VENTRICLE RV Basal diam:  4.80 cm RV Mid diam:    3.50 cm RV S prime:     10.10 cm/s TAPSE (M-mode): 1.7 cm LEFT ATRIUM              Index        RIGHT ATRIUM           Index LA diam:        5.80 cm  2.90 cm/m   RA Area:     26.40 cm LA Vol (A2C):   140.0 ml 69.91 ml/m  RA Volume:   84.40 ml  42.14 ml/m LA Vol (  A4C):   91.4 ml  45.64 ml/m LA Biplane Vol: 114.0 ml 56.92 ml/m  AORTIC VALVE                    PULMONIC VALVE AV Area (Vmax):    2.66 cm     PV Vmax:       0.87 m/s AV Area (Vmean):   2.71 cm     PV Peak grad:  3.0 mmHg AV Area (VTI):     2.94 cm AV Vmax:           134.00 cm/s AV Vmean:          88.233 cm/s AV VTI:            0.277 m AV Peak Grad:      7.2 mmHg AV Mean Grad:      3.5  mmHg LVOT Vmax:         103.00 cm/s LVOT Vmean:        69.000 cm/s LVOT VTI:          0.235 m LVOT/AV VTI ratio: 0.85  AORTA Ao Root diam: 3.90 cm Ao Asc diam:  3.40 cm MITRAL VALVE               TRICUSPID VALVE MV Area (PHT): 3.05 cm    TR Peak grad:   33.9 mmHg MV Area VTI:   2.56 cm    TR Vmax:        291.00 cm/s MV Peak grad:  4.2 mmHg MV Mean grad:  2.0 mmHg    SHUNTS MV Vmax:       1.02 m/s    Systemic VTI:  0.24 m MV Vmean:      61.2 cm/s   Systemic Diam: 2.10 cm MV Decel Time: 249 msec MV E velocity: 85.30 cm/s MV A velocity: 75.80 cm/s MV E/A ratio:  1.13 Carlyle Dolly MD Electronically signed by Carlyle Dolly MD Signature Date/Time: 03/01/2022/1:08:36 PM    Final    DG Chest 1 View  Result Date: 03/01/2022 CLINICAL DATA:  Status post right thoracentesis. EXAM: CHEST  1 VIEW COMPARISON:  CT chest and chest x-ray from yesterday. FINDINGS: Unchanged tunneled right internal jugular dialysis catheter. Unchanged mild cardiomegaly status post CABG. Decreased now trace small right pleural effusion. No pneumothorax. Improved aeration of the right lower lobe. No acute osseous abnormality. IMPRESSION: 1. Decreased right pleural effusion status post thoracentesis. No pneumothorax. Electronically Signed   By: Titus Dubin M.D.   On: 03/01/2022 11:26   US THORACENTESIS ASP PLEURAL SPACE W/IMG GUIDE  Result Date: 03/01/2022 INDICATION: Right pleural effusion. EXAM: ULTRASOUND GUIDED RIGHT THORACENTESIS MEDICATIONS: None. COMPLICATIONS: None immediate. PROCEDURE: An ultrasound guided thoracentesis was thoroughly discussed with the patient and questions answered. The benefits, risks, alternatives and complications were also discussed. The patient understands and wishes to proceed with the procedure. Written consent was obtained. Ultrasound was performed to localize and mark an adequate pocket of fluid in the right chest. The area was then prepped and draped in the normal sterile fashion. 1% Lidocaine was  used for local anesthesia. Under ultrasound guidance a 6 Fr Safe-T-Centesis catheter was introduced. Thoracentesis was performed. The catheter was removed and a dressing applied. FINDINGS: A total of approximately 1.3 L of dark yellow fluid was removed. Samples were sent to the laboratory as requested by the clinical team. IMPRESSION: Successful ultrasound guided right thoracentesis yielding 1.3 L of pleural fluid. Electronically Signed   By: Titus Dubin  M.D.   On: 03/01/2022 11:23   CT Angio Chest Pulmonary Embolism (PE) W or WO Contrast  Result Date: 02/28/2022 CLINICAL DATA:  Acute respiratory failure, cough, shortness of breath, hypoxia EXAM: CT ANGIOGRAPHY CHEST WITH CONTRAST TECHNIQUE: Multidetector CT imaging of the chest was performed using the standard protocol during bolus administration of intravenous contrast. Multiplanar CT image reconstructions and MIPs were obtained to evaluate the vascular anatomy. RADIATION DOSE REDUCTION: This exam was performed according to the departmental dose-optimization program which includes automated exposure control, adjustment of the mA and/or kV according to patient size and/or use of iterative reconstruction technique. CONTRAST:  35m OMNIPAQUE IOHEXOL 350 MG/ML SOLN COMPARISON:  01/23/2022 FINDINGS: Cardiovascular: There is homogeneous enhancement in thoracic aorta. Coronary artery calcifications are seen. There is previous coronary artery bypass surgery. There is ectasia of main pulmonary artery measuring 4.1 cm suggesting pulmonary arterial hypertension. There are no intraluminal filling defects in central pulmonary artery branches. Evaluation of small peripheral pulmonary artery branches is limited by motion artifacts and atelectasis. Heart is enlarged in size. There is reflux of contrast into hepatic veins suggesting tricuspid incompetence. Tip of right IJ central venous catheter is seen in the region of right atrium. Mediastinum/Nodes: No new significant  lymphadenopathy seen. Lungs/Pleura: There is large right pleural effusion with significant interval increase. Density measurements in the right pleural effusion are less than 20 Hounsfield units. There is minimal left pleural effusion. There is compression atelectasis in right lower lobe and to a lesser extent in right middle lobe and right upper lobe. No focal infiltrates are seen in left lung. There is no pneumothorax. Upper Abdomen: There is fatty infiltration in the liver. Spleen is enlarged measuring 15.4 cm in AP diameter. Multiple diverticula are seen in visualized portions of colon. Musculoskeletal: Deformity in the right tenth rib may suggest old ununited fracture. Review of the MIP images confirms the above findings. IMPRESSION: There is no evidence of central pulmonary artery embolism. Evaluation of small peripheral pulmonary artery branches is limited by motion artifacts. There is ectasia of main pulmonary artery suggesting pulmonary arterial hypertension. Coronary artery disease. Previous coronary artery bypass surgery. Cardiomegaly. Tricuspid incompetence. There is large right pleural effusion with significant interval increase in size. Compression atelectasis is seen in right lung, especially in right lower lobe. There is minimal left pleural effusion. Fatty liver.  Enlarged spleen.  Diverticulosis of colon. Other findings as described in the body of the report. Electronically Signed   By: PElmer PickerM.D.   On: 02/28/2022 18:07   DG Chest Port 1 View  Result Date: 02/28/2022 CLINICAL DATA:  Chest pain and dyspnea EXAM: PORTABLE CHEST 1 VIEW COMPARISON:  01/29/2022 chest radiograph. FINDINGS: Intact sternotomy wires. Pacer pad overlies the left chest. Right internal jugular central venous catheter terminates over the cavoatrial junction. Stable cardiomediastinal silhouette with mild cardiomegaly. No pneumothorax. Small right pleural effusion is slightly increased. No left pleural effusion.  Slightly low lung volumes, similar. Cephalization of the pulmonary vasculature without overt pulmonary edema. Mild hazy right lung base opacity is mildly increased. IMPRESSION: 1. Stable mild cardiomegaly without overt pulmonary edema. 2. Small right pleural effusion, slightly increased. 3. Mildly increased hazy right lung base opacity, favor atelectasis. Electronically Signed   By: JIlona SorrelM.D.   On: 02/28/2022 13:40   NM PET Image Restage (PS) Skull Base to Thigh (F-18 FDG)  Result Date: 02/11/2022 CLINICAL DATA:  Subsequent treatment strategy for esophageal carcinoma. EXAM: NUCLEAR MEDICINE PET SKULL BASE TO THIGH TECHNIQUE: 9.91  mCi F-18 FDG was injected intravenously. Full-ring PET imaging was performed from the skull base to thigh after the radiotracer. CT data was obtained and used for attenuation correction and anatomic localization. Fasting blood glucose: 228 mg/dl COMPARISON:  07/15/2021 FINDINGS: Mediastinal blood pool activity: SUV max 2.22 Liver activity: SUV max NA NECK: No hypermetabolic lymph nodes in the neck. Incidental CT findings: None CHEST: No tracer avid supraclavicular, axillary, mediastinal, or hilar lymph nodes. No abnormal hypermetabolism associated with the esophagus. No tracer avid pulmonary nodules. Small to moderate right pleural effusion is again noted. Incidental CT findings: Cardiac enlargement. Aortic atherosclerosis. Status post CABG. ABDOMEN/PELVIS: No abnormal hypermetabolic activity within the liver, pancreas, adrenal glands, or spleen. No hypermetabolic lymph nodes in the abdomen or pelvis. Incidental CT findings: Bilateral fluid density kidney cysts are again noted compatible with simple cysts. No follow-up imaging recommended. Similar appearance of nonspecific bilateral perinephric fat stranding. Decrease abdominal ascites. Sigmoid diverticulosis without signs of acute diverticulitis. SKELETON: No focal hypermetabolic activity to suggest skeletal metastasis.  Incidental CT findings: Subacute to chronic scratch set healing right posterior rib fractures are again noted. Chronic right anterior rib fracture deformities are also again noted. IMPRESSION: 1. There are no signs to suggest residual or recurrent FDG avid tumor within the esophagus. No FDG avid adenopathy or signs of solid organ metastasis. 2. Persistent right pleural effusion.  Decrease in ascites. 3. Cardiac enlargement. Status post CABG. Aortic Atherosclerosis (ICD10-I70.0). Electronically Signed   By: Kerby Moors M.D.   On: 02/11/2022 07:47    Orson Eva, DO  [facility]  If 7PM-7AM, please contact night-coverage [email or web address] Password [email or web address] 03/04/2022, 3:21 PM   LOS: 4 days

## 2022-03-04 NOTE — Progress Notes (Signed)
Received patient in bed to unit.  Alert and oriented.  Informed consent signed and in chart.   Treatment initiated: 1510 Treatment completed: [place]  Patient tolerated well.  Transported back to the room  Alert, without acute distress.  Hand-off given to patient's nurse.   Access used: catheter Access issues: none  Total UF removed: 2.2 L Medication(s) given: Aranesp 60 mcg Post HD VS: 150/70 P 84 R 18 Post HD weight: 81 kg    [facility]

## 2022-03-04 NOTE — Assessment & Plan Note (Signed)
03/03/22 mod amounts Appreciate pulm consult Appears resolved May be related to aspiration/pulm HTN Monitor H/H -stable on RA

## 2022-03-04 NOTE — Evaluation (Signed)
Clinical/Bedside Swallow Evaluation Patient Details  Name: [name] MRN: [id number] Date of Birth: 06-Jun-1956  Today's Date: 03/04/2022 Time: SLP Start Time (ACUTE ONLY): 16 SLP Stop Time (ACUTE ONLY): 9381 SLP Time Calculation (min) (ACUTE ONLY): 28 min  Past Medical History:  Past Medical History:  Diagnosis Date   Anemia    Arthritis    hips shoulders   CAD (coronary artery disease) 03/03/2019   Cancer ([place])    Esophageal cancer 2022   CHF (congestive heart failure) ([place]) 2020   Chronic combined systolic and diastolic heart failure ([place]) 04/26/2018   Admitted 11/14-11/20/19-diuresed 10L   Depression 05/31/2021   Fatigue 10/14/2020   Hypertension    Myocardial infarction [facility])    Sleep apnea    uses a bipap machine   Stroke ([place]) 12/14/2020   no last weakness or paralysis   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/23/2019   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   Past Surgical History:  Past Surgical History:  Procedure Laterality Date   AV FISTULA PLACEMENT Left 12/21/2021   Procedure: LEFT ARM ARTERIOVENOUS (AV) FISTULA CREATION;  Surgeon: Rosetta Posner, MD;  [facility]: [facility];  Service: Vascular;  Laterality: Left;   BIOPSY  06/27/2019   Procedure: BIOPSY;  Surgeon: Rogene Houston, MD;  [facility];  Service: Endoscopy;;  ascending colon polyp   BIOPSY  10/21/2020   Procedure: BIOPSY;  Surgeon: Rogene Houston, MD;  [facility];  Service: Endoscopy;;  esophageal,gastric polyp   BIOPSY  02/24/2021   Procedure: BIOPSY;  Surgeon: Rogene Houston, MD;  [facility];  Service: Endoscopy;;  distal and proximal esophageal biopsies    CARDIAC SURGERY     CATARACT EXTRACTION     COLONOSCOPY N/A 06/27/2019   [name]:Diverticulosis in the entire examined colon. tubular adenoma in ascending colon, 25m tubular adenoma in prox sigmoid. external hemorrhoids   CORONARY ARTERY BYPASS GRAFT N/A 03/25/2019   Procedure:  CORONARY ARTERY BYPASS GRAFTING (CABG) X  4 USING LEFT INTERNAL MAMMARY ARTERY AND RIGHT SAPHENOUS VEIN GRAFTS;  Surgeon: LLajuana Matte MD;  [place] [place]  Service: Open Heart Surgery;  Laterality: N/A;   ESOPHAGOGASTRODUODENOSCOPY (EGD) WITH PROPOFOL N/A 10/21/2020   rehman:Normal hypopharynx.Normal proximal esophagus and mid esophagus.Esophageal mucosal changes consistent with long-segment Barrett's esophagus. (Focal low-grade dysplasia and atypia proximally) z line irregular 37 cm from incisors, 3 cm HH, single gastric polyp (fundic gland) normal duodenal bulb/second portion of duodenum, proximal margin of Barrett's at 34 cm   ESOPHAGOGASTRODUODENOSCOPY (EGD) WITH PROPOFOL N/A 02/24/2021   Procedure: ESOPHAGOGASTRODUODENOSCOPY (EGD) WITH PROPOFOL;  Surgeon: RRogene Houston MD;  [facility];  Service: Endoscopy;  Laterality: N/A;  1:35   ESOPHAGOGASTRODUODENOSCOPY (EGD) WITH PROPOFOL N/A 03/21/2021   Procedure: ESOPHAGOGASTRODUODENOSCOPY (EGD) WITH PROPOFOL;  Surgeon: HCarol Ada MD;  [place]  Service: Endoscopy;  Laterality: N/A;   ESOPHAGOGASTRODUODENOSCOPY (EGD) WITH PROPOFOL N/A 10/23/2021   Procedure: ESOPHAGOGASTRODUODENOSCOPY (EGD) WITH PROPOFOL;  Surgeon: DDoran Stabler MD;  [facility]  Service: Gastroenterology;  Laterality: N/A;   EXCISION MORTON'S NEUROMA     EYE SURGERY Left    retina   INCISION AND DRAINAGE OF WOUND Right 06/28/2021   Procedure: IRRIGATION AND DEBRIDEMENT WOUND;  Surgeon: PCindra Presume MD;  [place] [facility]  Service: Plastics;  Laterality: Right;  1.5 hour   INSERTION OF DIALYSIS CATHETER Right 12/21/2021   Procedure: INSERTION OF TUNNELED DIALYSIS CATHETER;  Surgeon: Rosetta Posner, MD;  [facility];  Service: Vascular;  Laterality: Right;   LEFT HEART CATH AND CORS/GRAFTS ANGIOGRAPHY N/A 02/01/2022   Procedure: LEFT HEART CATH AND CORS/GRAFTS ANGIOGRAPHY;  Surgeon: Martinique, Peter M, MD;  Location: [place] [facility];  Service: Cardiovascular;  Laterality: N/A;   POLYPECTOMY  06/27/2019   Procedure: POLYPECTOMY;  Surgeon: Rogene Houston, MD;  [facility];  Service: Endoscopy;;  proximal sigmoid colon   RIGHT/LEFT HEART CATH AND CORONARY ANGIOGRAPHY N/A 03/12/2019   Procedure: RIGHT/LEFT HEART CATH AND CORONARY ANGIOGRAPHY;  Surgeon: [name], MD;  Location: [place] [facility];  Service: Cardiovascular;  Laterality: N/A;   SHOULDER ARTHROSCOPY WITH ROTATOR CUFF REPAIR AND SUBACROMIAL DECOMPRESSION Right 08/26/2020   Procedure: RIGHT SHOULDER MINI OPEN ROTATOR CUFF REPAIR AND SUBACROMIAL DECOMPRESSION WITH PATCH GRAFT;  Surgeon: Susa Day, MD;  [facility];  Service: Orthopedics;  Laterality: Right;  90 MINS GENERAL WITH BLOCK   SKIN SPLIT GRAFT Right 06/28/2021   Procedure: SKIN GRAFT SPLIT THICKNESS;  Surgeon: [name], MD;  Location: [place];  Service: Plastics;  Laterality: Right;   TEE WITHOUT CARDIOVERSION N/A 03/25/2019   Procedure: TRANSESOPHAGEAL ECHOCARDIOGRAM (TEE);  Surgeon: [name], MD;  Location: [place];  Service: Open Heart Surgery;  Laterality: N/A;   HPI:  66 y.o. male with medical history significant for CABG, ESRD, cardiomyopathy with systolic and diastolic CHF, stroke, hypertension, diabetes mellitus.  Patient was brought to the ED from dialysis center with reports of worsening of his difficulty breathing.  At the time of my evaluation, patient is on BiPAP.  Patient was having dialysis today and was 1 and 54 minutes into session when it had to be stopped.  He was having significant worsening of his breathing, with use of accessory muscle, was placed on 4 L nasal cannula, and BiPAP here. Pt with hx of esophageal cancer and note Upper GI endoscopy on 10/23/21 completed secondary to acute on chronic anemia and bleeding that was presumed to be from post-treatment esophagitis; findings included one benign-appearing intrinsic moderate  stenosis in the distal esophagus measuring 1cmx3cm - it was traversed. BSE requested.    Assessment / Plan / Recommendation  Clinical Impression  Clinical swallowing evaluation completed while Pt was sitting upright in bed; Pt reports significant discomfort with swallowing that started ~ 2 weeks ago. Pt reports some regurgitation of PO and most recently some emesis containing blood and today emesis of white foamy phelgm. Pt with history of esophageal cancer and esophageal obstruction, generally detailed above in my H&P from upper endoscopy report completed 10/23/21. Pt consumed minimal trials of thin liquids and puree reporting globus sensation and discomfort but no overt s/sx of aspiration were observed with trials. Recommend consider barium pill esophagram and we can complete MBSS if would be of benefit, however with known esophageal dysphagia, barium pill esophagram may be the place to start. Recommend initiate puree diet with thin liquids. ST reviewed recommendations with RN, MD, Patient and Pt's wife. SLP Visit Diagnosis: Dysphagia, unspecified (R13.10)    Aspiration Risk  Risk for inadequate nutrition/hydration    Diet Recommendation Dysphagia 1 (Puree);Thin liquid   Liquid Administration via: Cup;Straw Medication Administration: Whole meds with liquid Supervision: Patient able to self feed Compensations: Minimize environmental distractions;Slow rate;Small sips/bites Postural Changes: Seated upright at 90 degrees    Other  Recommendations Recommended Consults: Consider GI evaluation;Consider esophageal assessment Oral Care Recommendations: Oral care BID    Recommendations  for follow up therapy are one component of a multi-disciplinary discharge planning process, led by the attending physician.  Recommendations may be updated based on patient status, additional functional criteria and insurance authorization.           Frequency and Duration min 1 x/week  1 week       Prognosis  Prognosis for Safe Diet Advancement: Good      Swallow Study   General Date of Onset: 02/28/22 HPI: 66 y.o. male with medical history significant for CABG, ESRD, cardiomyopathy with systolic and diastolic CHF, stroke, hypertension, diabetes mellitus.  Patient was brought to the ED from dialysis center with reports of worsening of his difficulty breathing.  At the time of my evaluation, patient is on BiPAP.  Patient was having dialysis today and was 1 and 54 minutes into session when it had to be stopped.  He was having significant worsening of his breathing, with use of accessory muscle, was placed on 4 L nasal cannula, and BiPAP here. Pt with hx of esophageal cancer and note Upper GI endoscopy on 10/23/21 completed secondary to acute on chronic anemia and bleeding that was presumed to be from post-treatment esophagitis; findings included one benign-appearing intrinsic moderate stenosis in the distal esophagus measuring 1cmx3cm - it was traversed. BSE requested. Type of Study: Bedside Swallow Evaluation Previous Swallow Assessment: none in chart Diet Prior to this Study: NPO Temperature Spikes Noted: No Respiratory Status: Nasal cannula Behavior/Cognition: Alert;Cooperative;Pleasant mood Oral Cavity Assessment: Within Functional Limits Oral Care Completed by SLP: Recent completion by staff Oral Cavity - Dentition: Adequate natural dentition Vision: Functional for self-feeding Self-Feeding Abilities: Able to feed self Patient Positioning: Upright in bed Baseline Vocal Quality: Normal Volitional Cough: Strong Volitional Swallow: Able to elicit    Oral/Motor/Sensory Function     Ice Chips Ice chips: Within functional limits   Thin Liquid Thin Liquid: Within functional limits    Nectar Thick Nectar Thick Liquid: Not tested   Honey Thick Honey Thick Liquid: Not tested   Puree Puree: Within functional limits   Solid    Nakya Weyand H. Roddie Mc, CCC-SLP Speech Language Pathologist  Solid:  Not tested      Wende Bushy 03/04/2022,5:35 PM

## 2022-03-05 DIAGNOSIS — R7881 Bacteremia: Secondary | ICD-10-CM | POA: Diagnosis not present

## 2022-03-05 DIAGNOSIS — G9341 Metabolic encephalopathy: Secondary | ICD-10-CM | POA: Diagnosis not present

## 2022-03-05 DIAGNOSIS — I5043 Acute on chronic combined systolic (congestive) and diastolic (congestive) heart failure: Secondary | ICD-10-CM | POA: Diagnosis not present

## 2022-03-05 DIAGNOSIS — R1310 Dysphagia, unspecified: Secondary | ICD-10-CM

## 2022-03-05 DIAGNOSIS — R1319 Other dysphagia: Secondary | ICD-10-CM

## 2022-03-05 DIAGNOSIS — J9601 Acute respiratory failure with hypoxia: Secondary | ICD-10-CM | POA: Diagnosis not present

## 2022-03-05 DIAGNOSIS — R042 Hemoptysis: Secondary | ICD-10-CM

## 2022-03-05 LAB — CULTURE, BLOOD (ROUTINE X 2)
Culture: NO GROWTH
Special Requests: ADEQUATE

## 2022-03-05 LAB — GLUCOSE, CAPILLARY
Glucose-Capillary: 154 mg/dL — ABNORMAL HIGH (ref 70–99)
Glucose-Capillary: 178 mg/dL — ABNORMAL HIGH (ref 70–99)
Glucose-Capillary: 163 mg/dL — ABNORMAL HIGH (ref 70–99)
Glucose-Capillary: 194 mg/dL — ABNORMAL HIGH (ref 70–99)

## 2022-03-05 NOTE — Progress Notes (Signed)
PROGRESS NOTE  [name] IEP:[id number] DOB: November 24, 1955 DOA: 02/28/2022 PCP: [name], MD  Brief History:  66 y.o. male with medical history significant for CABG, ESRD, cardiomyopathy with systolic and diastolic CHF, stroke, hypertension, diabetes mellitus. Patient was brought to the ED from dialysis center with reports of worsening of his difficulty breathing.  At the time of my evaluation, patient is on BiPAP. Patient was having dialysis today and was 1 and 54 minutes into session when it had to be stopped.  He was having significant worsening of his breathing, with use of accessory muscle, was placed on 4 L nasal cannula, and BiPAP here. Patient reports has been having difficulty breathing over the past 2 days.  He reported cough productive of whitish phlegm.  On arrival to the ED there was some wheezing.   Recent hospitalization 8/19 to 8/25 for chest pain, transferred to [facility], due to history of previous CABG.  Underwent cardiac cath 8/22 which showed patent CABG.   ED Course: Temperature 98.9.  Initial heart rate 103 improved to 70s, respiratory rate initially up to 30 improved after BiPAP, blood pressure 120s to 170s.  O2 sats 90% on 4 L nasal cannula, subsequently switched to BiPAP due to tachycardia, tachypnea, accessory muscle use and increased work of breathing. Chest x-ray with mild cardiomegaly without overt pulmonary edema, small right pleural effusion slightly increased, hazy right lung base opacity favor atelectasis. VBG with pH of 7.3, PCO2 of 43.  Lactic acid 3.5.  Troponin 51.   IV ceftriaxone and azithromycin started. Initial concern for flash pulmonary edema, hence nitroglycerin drip initially started and later discontinued with improvement in respiratory status. However, the patient was noted to have gram-negative bacteremia, and he was started on cefepime empirically.  On the morning of 03/03/2022, the patient was a little more somnolent and  confused.  However, he woke up to voice and was able to answer questions appropriately when he was awake.  Work-up was undertaken to clarify etiology of his altered mental status.     Assessment and Plan: * Acute respiratory failure with hypoxia (HCC) Difficulty breathing over the past 2 days PTA -placed on BiPAP due to increased work of breathing.   -Respiratory status has improved after thora (1.3L removed).  Quit smoking cigarettes about 33 years ago. . -Started on BiPAP and nitroglycerin drip which was discontinued after improvement in respiratory status. -Hold off on further antibiotics at this time -Obtain CTA chest, I talked to nephrologist Dr. [name], okay to administer IV contrast at this time -Lactic acid 3.5 > 1.9 resolved without intervention, likely secondary to hypoxia, doubt infection. -Addendum-no PE, shows large right pleural effusion increased in size from prior.  Likely etiology for his hypoxia.  -ordered ultrasound-guided thoracentesis>>1.3L -now stable on RA  DM type 2 causing vascular disease (HCC) A1c 7.7.  Glucose 176. - continue SSI -CBGs largely controlled   Essential hypertension, benign Systolic 660Y to 301S. -Resume doxazosin, Imdur, metoprolol  -hydralazine restarted by cardiology team 9/19>>hold for now due to soft BPs  Acute metabolic encephalopathy 0/03/9322 AM--patient is somnolent but arouses to voice and answering questions appropriately -CT brain--neg -Ammonia--32 -CBG 151 -Check VBG 7.5/35/59/27 RA B12--564 -Discontinue any hypnotic medications -May be related to cefepime>>switch to [facility] -mental status now improved  Acute on chronic combined systolic and diastolic CHF (congestive heart failure) ([place]) 07/25/21 Echo--EF 40-45%, no WMA, mod RV dysfx, PASP 56.5 - BNP >4500, large right  effusion on CT - transiently on NG gtt in setting of HTN now off.  - fluid management per HD 03/01/22 Echo--EF 40-45%, global HK, RV  overload -03/02/2022 UF 1.8L 03/04/22-UF 2.2L Volume overload status improving  SVT (supraventricular tachycardia) ([place]) --Pt found unresponsive in sustained SVT on 9/18, resolved after vagal maneuvers -- appreciate cardiology -continued to have SVT 9/20 evening, one episode 9/21 evening -one episode 9/22 evening lasting 8 sec -switched coreg to metoprolol -started amiodarone drip>>continue  -continue metoprolol 75 mg bid -overall improving -plan switch to po amio today if tolerates po intake  Pleural effusion on right -- likely major contributor to his dyspneic symptoms -- 9/19 thora>>1.3L--appears exudative by Light's criteria -cytology negative -repeat attempt 03/04/22>> not enough fluid  Dysphagia Appears to be predominantly esophageal etiology Evaluated by speech>>dys1 with thin, adv as tolerated  Hemoptysis 03/03/22 mod amounts Appreciate pulm consult Appears resolved May be related to aspiration/pulm HTN Monitor H/H -stable on RA  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 [name] --considered poor candidate for esophagectomy. After multidisciplinary discussion the plan was to continue with endoscopic treatments, and continue to follow-up with oncology for surveillance and any signs of progression of disease -status post EMR, cryotherapy, and most recently RFA in April 2023 -02/10/22 PET scan--no local recurrence   DNR (do not resuscitate) --continue DNR order while in hospital   gram neg rod Bacteremia -E. [place] -Due to altered mental status, change antibiotics to merrem -repeat blood culture neg to date  ESRD on dialysis [facility]) Started dialysis within the past month.  Schedule Monday Wednesday Friday.  HD session terminated today due to sudden worsening of his dyspnea.  I talked with Dr. Colodonato>>, okay with IV contrast for now. -nephrology consulted to manage inpatient HD -03/02/22 UF 1.8L -03/04/22 UF 2.2L -HD 03/04/22   Obstructive sleep  apnea BIPAP QHS while hospitalized.  S/P CABG (coronary artery bypass graft) Recent cardiac cath 8/22, showed patent grafts, severe three-vessel native CAD.Marland Kitchen  History of CABG 2020.  Troponin today 51 > 52, not consistent with ACS -Resume aspirin, metoprolol, Isordil   -No chest pain -03/01/2022 echo EF 40-45%, global HK, mild TR, RV overload    Family Communication:   significant other at bedside 9/22   Consultants:  renal, cardiology   Code Status:  FULL  DVT Prophylaxis:  [place] Heparin      Procedures: As Listed in Progress Note Above   Antibiotics: Cefepime 9/19>>9/21 Merrem 9/21>>           Subjective: Nausea is getting better.  Denies any emesis.  Denies hemoptysis.  Denies f/c, cp, sob, abd pain, headache.  Objective: Vitals:   03/05/22 0900 03/05/22 1100 03/05/22 1151 03/05/22 1200  BP: (!) 103/39 (!) 97/40  (!) 123/56  Pulse: 74 76  73  Resp: 20 (!) 30  (!) 21  Temp:   98.2 F (36.8 C)   TempSrc:   Oral   SpO2: 94% 93%  98%  Weight:      Height:        Intake/Output Summary (Last 24 hours) at 03/05/2022 1253 Last data filed at 03/05/2022 0419 Gross per 24 hour  Intake 725.56 ml  Output 2450 ml  Net -1724.44 ml   Weight change:  Exam:  General:  Pt is alert, follows commands appropriately, not in acute distress HEENT: No icterus, No thrush, No neck mass, [place]/AT Cardiovascular: RRR, S1/S2, no rubs, no gallops Respiratory: fine bibasilar rales. No wheeze Abdomen: Soft/+BS, non tender, non distended,  no guarding Extremities: No edema, No lymphangitis, No petechiae, No rashes, no synovitis   Data Reviewed: I have personally reviewed following labs and imaging studies Basic Metabolic Panel: Recent Labs  Lab 03/01/22 0040 03/01/22 0437 03/01/22 2123 03/02/22 0238 03/03/22 0612 03/04/22 0336  NA 135 137 134* 135 135 136  K 4.0 4.1 3.3* 3.7 3.5 3.9  CL 100 102 98 99 100 100  CO2 21* '23 24 23 24 23  '$ GLUCOSE 195* 183* 180* 194* 227* 151*  BUN  56* 59* 49* 51* 43* 61*  CREATININE 5.22* 5.38* 4.41* 4.76* 4.41* 5.60*  CALCIUM 8.1* 8.3* 8.0* 8.1* 8.2* 8.2*  MG 2.1  --  1.8 1.9  --  1.9  PHOS 6.1*  --   --  4.3 2.6  --    Liver Function Tests: Recent Labs  Lab 02/28/22 1238 03/02/22 0238 03/03/22 0612 03/03/22 1415  AST 33  --   --  15  ALT 32  --   --  21  ALKPHOS 135*  --   --  93  BILITOT 1.0  --   --  0.7  PROT 7.6  --   --  5.9*  ALBUMIN 4.0 3.3* 3.3* 3.1*   No results for input(s): "LIPASE", "AMYLASE" in the last 168 hours. Recent Labs  Lab 03/03/22 1415  AMMONIA 32   Coagulation Profile: Recent Labs  Lab 02/28/22 1238  INR 1.4*   CBC: Recent Labs  Lab 02/28/22 1238 03/01/22 0437 03/02/22 0238 03/03/22 0612 03/03/22 1415 03/04/22 0336  WBC 8.8 7.0 6.3 5.7  --  7.5  NEUTROABS 8.2*  --   --   --   --  5.4  HGB 10.7* 9.2* 9.0* 9.4* 8.8* 8.5*  HCT 35.0* 29.2* 28.5* 29.4* 28.1* 26.8*  MCV 87.5 85.6 85.1 85.0  --  84.8  PLT 198 157 148* 154  --  152   Cardiac Enzymes: No results for input(s): "CKTOTAL", "CKMB", "CKMBINDEX", "TROPONINI" in the last 168 hours. BNP: Invalid input(s): "POCBNP" CBG: Recent Labs  Lab 03/04/22 1700 03/04/22 1937 03/04/22 2105 03/05/22 0730 03/05/22 1146  GLUCAP 114* 102* 176* 154* 178*   HbA1C: No results for input(s): "HGBA1C" in the last 72 hours. Urine analysis:    Component Value Date/Time   COLORURINE YELLOW 02/28/2022 1414   APPEARANCEUR CLEAR 02/28/2022 1414   LABSPEC 1.014 02/28/2022 1414   PHURINE 5.0 02/28/2022 1414   GLUCOSEU >=500 (A) 02/28/2022 1414   HGBUR NEGATIVE 02/28/2022 1414   BILIRUBINUR NEGATIVE 02/28/2022 1414   KETONESUR NEGATIVE 02/28/2022 1414   PROTEINUR >=300 (A) 02/28/2022 1414   NITRITE NEGATIVE 02/28/2022 1414   LEUKOCYTESUR NEGATIVE 02/28/2022 1414   Sepsis Labs: '@LABRCNTIP'$ (procalcitonin:4,lacticidven:4) ) Recent Results (from the past 240 hour(s))  Culture, blood (Routine x 2)     Status: None   Collection Time:  02/28/22 12:38 PM   Specimen: BLOOD RIGHT FOREARM  Result Value Ref Range Status   Specimen Description   Final    BLOOD RIGHT FOREARM BOTTLES DRAWN AEROBIC AND ANAEROBIC   Special Requests Blood Culture adequate volume  Final   Culture   Final    NO GROWTH 5 DAYS Performed at [facility], [address] [place], [place] [address]    Report Status 03/05/2022 FINAL  Final  Culture, blood (Routine x 2)     Status: Abnormal   Collection Time: 02/28/22 12:43 PM   Specimen: Right Antecubital; Blood  Result Value Ref Range Status   Specimen Description  Final    RIGHT ANTECUBITAL BOTTLES DRAWN AEROBIC AND ANAEROBIC Performed at [facility], [address] [place], [place] [address]    Special Requests   Final    Blood Culture adequate volume Performed at [address] [place], [place] [address]    Culture  Setup Time   Final    GRAM NEGATIVE RODS Gram Stain Report Called to,Read Back By and Verified With: L HILTON RN [phone] [id number] K FORSYTH IN BOTH AEROBIC AND ANAEROBIC BOTTLES CRITICAL RESULT CALLED TO, READ BACK BY AND VERIFIED WITH: Cloyde Reams [id number] '@1700'$  FH Performed at [facility], [address] [place], [place] [address]    Culture ENTEROBACTER CLOACAE (A)  Final   Report Status 03/03/2022 FINAL  Final   Organism ID, Bacteria ENTEROBACTER CLOACAE  Final      Susceptibility   Enterobacter cloacae - MIC*    CEFAZOLIN >=64 RESISTANT Resistant     CEFEPIME <=0.12 SENSITIVE Sensitive     CEFTAZIDIME <=1 SENSITIVE Sensitive     CIPROFLOXACIN <=0.25 SENSITIVE Sensitive     GENTAMICIN <=1 SENSITIVE Sensitive     IMIPENEM 0.5 SENSITIVE Sensitive     TRIMETH/SULFA <=20 SENSITIVE Sensitive     PIP/TAZO <=4 SENSITIVE Sensitive     * ENTEROBACTER CLOACAE  Blood Culture ID Panel (Reflexed)     Status: Abnormal   Collection Time: 02/28/22 12:43 PM  Result Value Ref Range Status   Enterococcus faecalis NOT DETECTED NOT DETECTED Final   Enterococcus Faecium NOT  DETECTED NOT DETECTED Final   Listeria monocytogenes NOT DETECTED NOT DETECTED Final   Staphylococcus species NOT DETECTED NOT DETECTED Final   Staphylococcus aureus (BCID) NOT DETECTED NOT DETECTED Final   Staphylococcus epidermidis NOT DETECTED NOT DETECTED Final   Staphylococcus lugdunensis NOT DETECTED NOT DETECTED Final   Streptococcus species NOT DETECTED NOT DETECTED Final   Streptococcus agalactiae NOT DETECTED NOT DETECTED Final   Streptococcus pneumoniae NOT DETECTED NOT DETECTED Final   Streptococcus pyogenes NOT DETECTED NOT DETECTED Final   A.calcoaceticus-baumannii NOT DETECTED NOT DETECTED Final   Bacteroides fragilis NOT DETECTED NOT DETECTED Final   Enterobacterales DETECTED (A) NOT DETECTED Final    Comment: Enterobacterales represent a large order of gram negative bacteria, not a single organism. CRITICAL RESULT CALLED TO, READ BACK BY AND VERIFIED WITH: PHARMD S. HURTH [id number] '@1700'$  FH    Enterobacter cloacae complex DETECTED (A) NOT DETECTED Final    Comment: CRITICAL RESULT CALLED TO, READ BACK BY AND VERIFIED WITH: PHARMD S. Rogene [place] [id number] '@1700'$  FH    Escherichia coli NOT DETECTED NOT DETECTED Final   Klebsiella aerogenes NOT DETECTED NOT DETECTED Final   Klebsiella oxytoca NOT DETECTED NOT DETECTED Final   Klebsiella pneumoniae NOT DETECTED NOT DETECTED Final   Proteus species NOT DETECTED NOT DETECTED Final   Salmonella species NOT DETECTED NOT DETECTED Final   Serratia marcescens NOT DETECTED NOT DETECTED Final   Haemophilus influenzae NOT DETECTED NOT DETECTED Final   Neisseria meningitidis NOT DETECTED NOT DETECTED Final   Pseudomonas aeruginosa NOT DETECTED NOT DETECTED Final   Stenotrophomonas maltophilia NOT DETECTED NOT DETECTED Final   Candida albicans NOT DETECTED NOT DETECTED Final   Candida auris NOT DETECTED NOT DETECTED Final   Candida glabrata NOT DETECTED NOT DETECTED Final   Candida krusei NOT DETECTED NOT DETECTED Final   Candida parapsilosis  NOT DETECTED NOT DETECTED Final   Candida tropicalis NOT DETECTED NOT DETECTED Final   Cryptococcus neoformans/gattii NOT DETECTED NOT DETECTED  Final   CTX-M ESBL NOT DETECTED NOT DETECTED Final   Carbapenem resistance IMP NOT DETECTED NOT DETECTED Final   Carbapenem resistance KPC NOT DETECTED NOT DETECTED Final   Carbapenem resistance NDM NOT DETECTED NOT DETECTED Final   Carbapenem resist OXA 48 LIKE NOT DETECTED NOT DETECTED Final   Carbapenem resistance VIM NOT DETECTED NOT DETECTED Final    Comment: Performed at [facility], [place] [address], [place], [place] [address]  MRSA Next Gen by PCR, Nasal     Status: None   Collection Time: 02/28/22 10:20 PM   Specimen: Nasal Mucosa; Nasal Swab  Result Value Ref Range Status   MRSA by PCR Next Gen NOT DETECTED NOT DETECTED Final    Comment: (NOTE) The GeneXpert MRSA Assay (FDA approved for NASAL specimens only), is one component of a comprehensive MRSA colonization surveillance program. It is not intended to diagnose MRSA infection nor to guide or monitor treatment for MRSA infections. Test performance is not FDA approved in patients less than 24 years old. Performed at [facility], [address], [place], [place] [address]   Gram stain     Status: None   Collection Time: 03/01/22 10:20 AM   Specimen: Pleura  Result Value Ref Range Status   Specimen Description PLEURAL  Final   Special Requests PLEURAL  Final   Gram Stain   Final    NO ORGANISMS SEEN WBC PRESENT,BOTH PMN AND MONONUCLEAR CYTOSPIN SMEAR Performed at [facility], [address] [place], [place] [address]    Report Status 03/01/2022 FINAL  Final  Culture, body fluid w Gram Stain-bottle     Status: None (Preliminary result)   Collection Time: 03/01/22 10:20 AM   Specimen: Pleura  Result Value Ref Range Status   Specimen Description PLEURAL  Final   Special Requests   Final    BOTTLES DRAWN AEROBIC AND ANAEROBIC Blood Culture adequate volume   Culture   Final     NO GROWTH 4 DAYS Performed at [facility], [address] [place], [place] [address]    Report Status PENDING  Incomplete  Culture, blood (Routine X 2) w Reflex to ID Panel     Status: None (Preliminary result)   Collection Time: 03/03/22  7:56 PM   Specimen: BLOOD RIGHT HAND  Result Value Ref Range Status   Specimen Description BLOOD RIGHT HAND  Final   Special Requests   Final    BOTTLES DRAWN AEROBIC AND ANAEROBIC Blood Culture adequate volume   Culture   Final    NO GROWTH 2 DAYS Performed at [facility], [address], [place], [place] [address]    Report Status PENDING  Incomplete  Culture, blood (Routine X 2) w Reflex to ID Panel     Status: None (Preliminary result)   Collection Time: 03/03/22  7:56 PM   Specimen: BLOOD RIGHT HAND  Result Value Ref Range Status   Specimen Description BLOOD RIGHT HAND  Final   Special Requests   Final    BOTTLES DRAWN AEROBIC AND ANAEROBIC Blood Culture adequate volume   Culture   Final    NO GROWTH 2 DAYS Performed at [facility], [address], [place], [place] [address]    Report Status PENDING  Incomplete     Scheduled Meds:  aspirin EC  324 mg Oral Daily   calcium acetate  667 mg Oral TID WC   Chlorhexidine Gluconate Cloth  6 each Topical Q0600   darbepoetin (ARANESP) injection -  DIALYSIS  60 mcg Intravenous Q Fri-HD   doxazosin  4 mg Oral Daily   feeding supplement (NEPRO CARB STEADY)  237 mL Oral BID BM   heparin  5,000 Units Subcutaneous Q8H   insulin aspart  0-5 Units Subcutaneous QHS   insulin aspart  0-9 Units Subcutaneous TID WC   isosorbide dinitrate  30 mg Oral TID   metoprolol tartrate  75 mg Oral BID   sertraline  100 mg Oral Daily   Continuous Infusions:  amiodarone 30 mg/hr (03/05/22 1057)   meropenem (MERREM) IV 500 mg (03/04/22 2140)    Procedures/Studies: [place] CHEST (PLEURAL EFFUSION)  Result Date: 03/04/2022 CLINICAL DATA:  Pleural effusion EXAM: CHEST ULTRASOUND COMPARISON:  Radiograph 03/03/2022  FINDINGS: There is a small right pleural effusion. IMPRESSION: Small right pleural effusion. Electronically Signed   By: Maurine Simmering M.D.   On: 03/04/2022 09:29   DG CHEST PORT 1 VIEW  Result Date: 03/03/2022 CLINICAL DATA:  Coughing up blood today EXAM: PORTABLE CHEST 1 VIEW COMPARISON:  Radiograph 03/01/2022 FINDINGS: Unchanged mild cardiomegaly. Prior median sternotomy and postsurgical changes of CABG. Right neck catheter tip overlies the right atrium. Central pulmonary vascular congestion. Stable small right pleural effusion. No evidence of pneumothorax. Bones are unchanged. IMPRESSION: Mild cardiomegaly and central pulmonary vascular congestion. Stable small right pleural effusion.  Bibasilar atelectasis. Electronically Signed   By: Maurine Simmering M.D.   On: 03/03/2022 16:41   CT ABDOMEN PELVIS WO CONTRAST  Result Date: 03/03/2022 CLINICAL DATA:  Abdominal pain, acute, nonlocalized. Recent thoracentesis. Esophageal cancer. EXAM: CT ABDOMEN AND PELVIS WITHOUT CONTRAST TECHNIQUE: Multidetector CT imaging of the abdomen and pelvis was performed following the standard protocol without IV contrast. RADIATION DOSE REDUCTION: This exam was performed according to the departmental dose-optimization program which includes automated exposure control, adjustment of the mA and/or kV according to patient size and/or use of iterative reconstruction technique. COMPARISON:  CT of the abdomen and pelvis 01/23/2022. PET scan 02/10/2022. FINDINGS: Lower chest: Moderate size right pleural effusion is present. Mild dependent atelectasis is associated. There is no scratched at no pneumothorax is present. Dense coronary artery calcifications are present. No significant pericardial effusion is present. Hepatobiliary: No focal liver abnormality is seen. No gallstones, gallbladder wall thickening, or biliary dilatation. Pancreas: Unremarkable. No pancreatic ductal dilatation or surrounding inflammatory changes. Spleen: Normal in  size without focal abnormality. Adrenals/Urinary Tract: Adrenal glands are within normal limits bilaterally. Stable bilateral simple cysts are present. Chronic stranding about both kidneys is stable. No other mass lesion or stone is present. No obstruction is present. Ureters are within normal limits. The urinary bladder is within normal limits. Stomach/Bowel: Stomach and duodenum are within normal limits. Small bowel is unremarkable. Terminal ileum is normal. The appendix is visualized and within normal limits. Loops of small bowel are present in the anterior right abdomen, likely representing adhesions. The ascending and transverse colon are within normal limits. Descending and sigmoid colon are normal. Vascular/Lymphatic: Atherosclerotic calcifications are present in the aorta and branch vessels. No aneurysm is present. Reproductive: Prostate measures 5.4 cm in transverse diameter. Other: No abdominal wall hernia or abnormality. No abdominopelvic ascites. Musculoskeletal: Vertebral body heights and alignment are normal. No focal osseous abnormalities are present. Bony pelvis is within normal limits. Hips are located and normal. IMPRESSION: 1. Moderate size right pleural effusion and associated atelectasis. 2. No acute or focal lesion to explain the patient's abdominal pain. 3. Loops of small bowel in the anterior right abdomen, likely representing adhesions. No  obstruction is associated. 4. Coronary artery disease. 5. Aortic Atherosclerosis (ICD10-I70.0). Electronically Signed   By: San Morelle M.D.   On: 03/03/2022 13:43   CT HEAD WO CONTRAST (5MM)  Result Date: 03/03/2022 CLINICAL DATA:  Altered mental status. EXAM: CT HEAD WITHOUT CONTRAST TECHNIQUE: Contiguous axial images were obtained from the base of the skull through the vertex without intravenous contrast. RADIATION DOSE REDUCTION: This exam was performed according to the departmental dose-optimization program which includes automated  exposure control, adjustment of the mA and/or kV according to patient size and/or use of iterative reconstruction technique. COMPARISON:  None Available. FINDINGS: Brain: Mild chronic ischemic white matter disease. No mass effect or midline shift is noted. Ventricular size is within normal limits. There is no evidence of mass lesion, hemorrhage or acute infarction. Vascular: No hyperdense vessel or unexpected calcification. Skull: Normal. Negative for fracture or focal lesion. Sinuses/Orbits: No acute finding. Other: None. IMPRESSION: No acute intracranial abnormality seen. Electronically Signed   By: Marijo Conception M.D.   On: 03/03/2022 13:31   ECHOCARDIOGRAM COMPLETE  Result Date: 03/01/2022    ECHOCARDIOGRAM REPORT   Patient Name:   [name] Date of Exam: 03/01/2022 Medical Rec #:  [id number]        Height:       70.0 in Accession #:    [id number]       Weight:       181.4 lb Date of Birth:  1956-01-12        BSA:          2.003 m Patient Age:    55 years         BP:           131/55 mmHg Patient Gender: M                HR:           69 bpm. Exam Location:  [facility] Procedure: 2D Echo, Cardiac Doppler and Color Doppler Indications:    Dyspnea  History:        Patient has prior history of Echocardiogram examinations, most                 recent 07/25/2021. CHF and Cardiomyopathy, CAD, Prior CABG,                 Stroke and PAD, Arrythmias:Tachycardia, Signs/Symptoms:Shortness                 of Breath, Edema and Chest Pain; Risk Factors:Hypertension,                 Diabetes and Dyslipidemia.  Sonographer:    Wenda Low Referring Phys: [id number] [place]  1. Left ventricular ejection fraction, by estimation, is 40 to 45%. The left ventricle has mildly decreased function. The left ventricle demonstrates global hypokinesis. There is moderate left ventricular hypertrophy. Left ventricular diastolic parameters are indeterminate.  2. Ventricular septum is flattened in systole  consistent with RV pressure overload. . Right ventricular systolic function is moderately reduced. The right ventricular size is severely enlarged. There is mildly elevated pulmonary artery systolic pressure.  3. Left atrial size was severely dilated.  4. Catheter tip noted in RA. Right atrial size was severely dilated.  5. The mitral valve is normal in structure. No evidence of mitral valve regurgitation. No evidence of mitral stenosis.  6. The tricuspid valve is abnormal.  7. The aortic valve is tricuspid. There is mild calcification  of the aortic valve. There is mild thickening of the aortic valve. Aortic valve regurgitation is not visualized. No aortic stenosis is present.  8. The inferior vena cava is dilated in size with >50% respiratory variability, suggesting right atrial pressure of 8 mmHg. FINDINGS  Left Ventricle: Left ventricular ejection fraction, by estimation, is 40 to 45%. The left ventricle has mildly decreased function. The left ventricle demonstrates global hypokinesis. The left ventricular internal cavity size was normal in size. There is  moderate left ventricular hypertrophy. Left ventricular diastolic parameters are indeterminate. Right Ventricle: Ventricular septum is flattened in systole consistent with RV pressure overload. The right ventricular size is severely enlarged. Right vetricular wall thickness was not well visualized. Right ventricular systolic function is moderately reduced. There is mildly elevated pulmonary artery systolic pressure. The tricuspid regurgitant velocity is 2.91 m/s, and with an assumed right atrial pressure of 8 mmHg, the estimated right ventricular systolic pressure is 71.2 mmHg. Left Atrium: Left atrial size was severely dilated. Right Atrium: Catheter tip noted in RA. Right atrial size was severely dilated. Pericardium: There is no evidence of pericardial effusion. Mitral Valve: The mitral valve is normal in structure. No evidence of mitral valve regurgitation.  No evidence of mitral valve stenosis. MV peak gradient, 4.2 mmHg. The mean mitral valve gradient is 2.0 mmHg. Tricuspid Valve: The tricuspid valve is abnormal. Tricuspid valve regurgitation is mild . No evidence of tricuspid stenosis. Aortic Valve: The aortic valve is tricuspid. There is mild calcification of the aortic valve. There is mild thickening of the aortic valve. There is mild aortic valve annular calcification. Aortic valve regurgitation is not visualized. No aortic stenosis  is present. Aortic valve mean gradient measures 3.5 mmHg. Aortic valve peak gradient measures 7.2 mmHg. Aortic valve area, by VTI measures 2.94 cm. Pulmonic Valve: The pulmonic valve was not well visualized. Pulmonic valve regurgitation is not visualized. No evidence of pulmonic stenosis. Aorta: The aortic root is normal in size and structure. Venous: The inferior vena cava is dilated in size with greater than 50% respiratory variability, suggesting right atrial pressure of 8 mmHg. IAS/Shunts: No atrial level shunt detected by color flow Doppler.  LEFT VENTRICLE PLAX 2D LVIDd:         5.40 cm      Diastology LVIDs:         4.30 cm      LV e' medial:    5.87 cm/s LV PW:         1.40 cm      LV E/e' medial:  14.5 LV IVS:        1.20 cm      LV e' lateral:   11.70 cm/s LVOT diam:     2.10 cm      LV E/e' lateral: 7.3 LV SV:         81 LV SV Index:   41 LVOT Area:     3.46 cm  LV Volumes (MOD) LV vol d, MOD A2C: 118.0 ml LV vol d, MOD A4C: 98.3 ml LV vol s, MOD A2C: 85.2 ml LV vol s, MOD A4C: 48.6 ml LV SV MOD A2C:     32.8 ml LV SV MOD A4C:     98.3 ml LV SV MOD BP:      39.3 ml RIGHT VENTRICLE RV Basal diam:  4.80 cm RV Mid diam:    3.50 cm RV S prime:     10.10 cm/s TAPSE (M-mode): 1.7 cm LEFT ATRIUM  Index        RIGHT ATRIUM           Index LA diam:        5.80 cm  2.90 cm/m   RA Area:     26.40 cm LA Vol (A2C):   140.0 ml 69.91 ml/m  RA Volume:   84.40 ml  42.14 ml/m LA Vol (A4C):   91.4 ml  45.64 ml/m LA Biplane  Vol: 114.0 ml 56.92 ml/m  AORTIC VALVE                    PULMONIC VALVE AV Area (Vmax):    2.66 cm     PV Vmax:       0.87 m/s AV Area (Vmean):   2.71 cm     PV Peak grad:  3.0 mmHg AV Area (VTI):     2.94 cm AV Vmax:           134.00 cm/s AV Vmean:          88.233 cm/s AV VTI:            0.277 m AV Peak Grad:      7.2 mmHg AV Mean Grad:      3.5 mmHg LVOT Vmax:         103.00 cm/s LVOT Vmean:        69.000 cm/s LVOT VTI:          0.235 m LVOT/AV VTI ratio: 0.85  AORTA Ao Root diam: 3.90 cm Ao Asc diam:  3.40 cm MITRAL VALVE               TRICUSPID VALVE MV Area (PHT): 3.05 cm    TR Peak grad:   33.9 mmHg MV Area VTI:   2.56 cm    TR Vmax:        291.00 cm/s MV Peak grad:  4.2 mmHg MV Mean grad:  2.0 mmHg    SHUNTS MV Vmax:       1.02 m/s    Systemic VTI:  0.24 m MV Vmean:      61.2 cm/s   Systemic Diam: 2.10 cm MV Decel Time: 249 msec MV E velocity: 85.30 cm/s MV A velocity: 75.80 cm/s MV E/A ratio:  1.13 Carlyle Dolly MD Electronically signed by Carlyle Dolly MD Signature Date/Time: 03/01/2022/1:08:36 PM    Final    DG Chest 1 View  Result Date: 03/01/2022 CLINICAL DATA:  Status post right thoracentesis. EXAM: CHEST  1 VIEW COMPARISON:  CT chest and chest x-ray from yesterday. FINDINGS: Unchanged tunneled right internal jugular dialysis catheter. Unchanged mild cardiomegaly status post CABG. Decreased now trace small right pleural effusion. No pneumothorax. Improved aeration of the right lower lobe. No acute osseous abnormality. IMPRESSION: 1. Decreased right pleural effusion status post thoracentesis. No pneumothorax. Electronically Signed   By: Titus Dubin M.D.   On: 03/01/2022 11:26   US THORACENTESIS ASP PLEURAL SPACE W/IMG GUIDE  Result Date: 03/01/2022 INDICATION: Right pleural effusion. EXAM: ULTRASOUND GUIDED RIGHT THORACENTESIS MEDICATIONS: None. COMPLICATIONS: None immediate. PROCEDURE: An ultrasound guided thoracentesis was thoroughly discussed with the patient and questions  answered. The benefits, risks, alternatives and complications were also discussed. The patient understands and wishes to proceed with the procedure. Written consent was obtained. Ultrasound was performed to localize and mark an adequate pocket of fluid in the right chest. The area was then prepped and draped in the normal sterile fashion. 1% Lidocaine was used for local anesthesia.  Under ultrasound guidance a 6 Fr Safe-T-Centesis catheter was introduced. Thoracentesis was performed. The catheter was removed and a dressing applied. FINDINGS: A total of approximately 1.3 L of dark yellow fluid was removed. Samples were sent to the laboratory as requested by the clinical team. IMPRESSION: Successful ultrasound guided right thoracentesis yielding 1.3 L of pleural fluid. Electronically Signed   By: Titus Dubin M.D.   On: 03/01/2022 11:23   CT Angio Chest Pulmonary Embolism (PE) W or WO Contrast  Result Date: 02/28/2022 CLINICAL DATA:  Acute respiratory failure, cough, shortness of breath, hypoxia EXAM: CT ANGIOGRAPHY CHEST WITH CONTRAST TECHNIQUE: Multidetector CT imaging of the chest was performed using the standard protocol during bolus administration of intravenous contrast. Multiplanar CT image reconstructions and MIPs were obtained to evaluate the vascular anatomy. RADIATION DOSE REDUCTION: This exam was performed according to the departmental dose-optimization program which includes automated exposure control, adjustment of the mA and/or kV according to patient size and/or use of iterative reconstruction technique. CONTRAST:  62m OMNIPAQUE IOHEXOL 350 MG/ML SOLN COMPARISON:  01/23/2022 FINDINGS: Cardiovascular: There is homogeneous enhancement in thoracic aorta. Coronary artery calcifications are seen. There is previous coronary artery bypass surgery. There is ectasia of main pulmonary artery measuring 4.1 cm suggesting pulmonary arterial hypertension. There are no intraluminal filling defects in central  pulmonary artery branches. Evaluation of small peripheral pulmonary artery branches is limited by motion artifacts and atelectasis. Heart is enlarged in size. There is reflux of contrast into hepatic veins suggesting tricuspid incompetence. Tip of right IJ central venous catheter is seen in the region of right atrium. Mediastinum/Nodes: No new significant lymphadenopathy seen. Lungs/Pleura: There is large right pleural effusion with significant interval increase. Density measurements in the right pleural effusion are less than 20 Hounsfield units. There is minimal left pleural effusion. There is compression atelectasis in right lower lobe and to a lesser extent in right middle lobe and right upper lobe. No focal infiltrates are seen in left lung. There is no pneumothorax. Upper Abdomen: There is fatty infiltration in the liver. Spleen is enlarged measuring 15.4 cm in AP diameter. Multiple diverticula are seen in visualized portions of colon. Musculoskeletal: Deformity in the right tenth rib may suggest old ununited fracture. Review of the MIP images confirms the above findings. IMPRESSION: There is no evidence of central pulmonary artery embolism. Evaluation of small peripheral pulmonary artery branches is limited by motion artifacts. There is ectasia of main pulmonary artery suggesting pulmonary arterial hypertension. Coronary artery disease. Previous coronary artery bypass surgery. Cardiomegaly. Tricuspid incompetence. There is large right pleural effusion with significant interval increase in size. Compression atelectasis is seen in right lung, especially in right lower lobe. There is minimal left pleural effusion. Fatty liver.  Enlarged spleen.  Diverticulosis of colon. Other findings as described in the body of the report. Electronically Signed   By: PElmer PickerM.D.   On: 02/28/2022 18:07   DG Chest Port 1 View  Result Date: 02/28/2022 CLINICAL DATA:  Chest pain and dyspnea EXAM: PORTABLE CHEST 1  VIEW COMPARISON:  01/29/2022 chest radiograph. FINDINGS: Intact sternotomy wires. Pacer pad overlies the left chest. Right internal jugular central venous catheter terminates over the cavoatrial junction. Stable cardiomediastinal silhouette with mild cardiomegaly. No pneumothorax. Small right pleural effusion is slightly increased. No left pleural effusion. Slightly low lung volumes, similar. Cephalization of the pulmonary vasculature without overt pulmonary edema. Mild hazy right lung base opacity is mildly increased. IMPRESSION: 1. Stable mild cardiomegaly without overt pulmonary edema. 2.  Small right pleural effusion, slightly increased. 3. Mildly increased hazy right lung base opacity, favor atelectasis. Electronically Signed   By: Ilona Sorrel M.D.   On: 02/28/2022 13:40   NM PET Image Restage (PS) Skull Base to Thigh (F-18 FDG)  Result Date: 02/11/2022 CLINICAL DATA:  Subsequent treatment strategy for esophageal carcinoma. EXAM: NUCLEAR MEDICINE PET SKULL BASE TO THIGH TECHNIQUE: 9.91 mCi F-18 FDG was injected intravenously. Full-ring PET imaging was performed from the skull base to thigh after the radiotracer. CT data was obtained and used for attenuation correction and anatomic localization. Fasting blood glucose: 228 mg/dl COMPARISON:  07/15/2021 FINDINGS: Mediastinal blood pool activity: SUV max 2.22 Liver activity: SUV max NA NECK: No hypermetabolic lymph nodes in the neck. Incidental CT findings: None CHEST: No tracer avid supraclavicular, axillary, mediastinal, or hilar lymph nodes. No abnormal hypermetabolism associated with the esophagus. No tracer avid pulmonary nodules. Small to moderate right pleural effusion is again noted. Incidental CT findings: Cardiac enlargement. Aortic atherosclerosis. Status post CABG. ABDOMEN/PELVIS: No abnormal hypermetabolic activity within the liver, pancreas, adrenal glands, or spleen. No hypermetabolic lymph nodes in the abdomen or pelvis. Incidental CT findings:  Bilateral fluid density kidney cysts are again noted compatible with simple cysts. No follow-up imaging recommended. Similar appearance of nonspecific bilateral perinephric fat stranding. Decrease abdominal ascites. Sigmoid diverticulosis without signs of acute diverticulitis. SKELETON: No focal hypermetabolic activity to suggest skeletal metastasis. Incidental CT findings: Subacute to chronic scratch set healing right posterior rib fractures are again noted. Chronic right anterior rib fracture deformities are also again noted. IMPRESSION: 1. There are no signs to suggest residual or recurrent FDG avid tumor within the esophagus. No FDG avid adenopathy or signs of solid organ metastasis. 2. Persistent right pleural effusion.  Decrease in ascites. 3. Cardiac enlargement. Status post CABG. Aortic Atherosclerosis (ICD10-I70.0). Electronically Signed   By: Kerby Moors M.D.   On: 02/11/2022 07:47    Orson Eva, DO  [facility]  If 7PM-7AM, please contact night-coverage [email or web address] Password [facility] 03/05/2022, 12:53 PM   LOS: 5 days

## 2022-03-05 NOTE — Assessment & Plan Note (Signed)
Appears to be predominantly esophageal etiology Evaluated by speech>>dys1 with thin, adv as tolerated GI consult appreciated>>EGD 9/26>>, significant esophagitis looks like Candida  -GI advocated for empiric tx with fluconazole x 14 days -tolerating dys 3 diet at time of d/c

## 2022-03-06 DIAGNOSIS — E1122 Type 2 diabetes mellitus with diabetic chronic kidney disease: Secondary | ICD-10-CM | POA: Diagnosis not present

## 2022-03-06 DIAGNOSIS — G9341 Metabolic encephalopathy: Secondary | ICD-10-CM | POA: Diagnosis not present

## 2022-03-06 DIAGNOSIS — J9601 Acute respiratory failure with hypoxia: Secondary | ICD-10-CM | POA: Diagnosis not present

## 2022-03-06 DIAGNOSIS — I5042 Chronic combined systolic (congestive) and diastolic (congestive) heart failure: Secondary | ICD-10-CM | POA: Diagnosis not present

## 2022-03-06 DIAGNOSIS — R7881 Bacteremia: Secondary | ICD-10-CM | POA: Diagnosis not present

## 2022-03-06 DIAGNOSIS — Z7189 Other specified counseling: Secondary | ICD-10-CM

## 2022-03-06 DIAGNOSIS — I130 Hypertensive heart and chronic kidney disease with heart failure and stage 1 through stage 4 chronic kidney disease, or unspecified chronic kidney disease: Secondary | ICD-10-CM | POA: Diagnosis not present

## 2022-03-06 DIAGNOSIS — I5043 Acute on chronic combined systolic (congestive) and diastolic (congestive) heart failure: Secondary | ICD-10-CM | POA: Diagnosis not present

## 2022-03-06 DIAGNOSIS — E1151 Type 2 diabetes mellitus with diabetic peripheral angiopathy without gangrene: Secondary | ICD-10-CM | POA: Diagnosis not present

## 2022-03-06 DIAGNOSIS — I2510 Atherosclerotic heart disease of native coronary artery without angina pectoris: Secondary | ICD-10-CM | POA: Diagnosis not present

## 2022-03-06 DIAGNOSIS — R011 Cardiac murmur, unspecified: Secondary | ICD-10-CM | POA: Diagnosis not present

## 2022-03-06 DIAGNOSIS — N186 End stage renal disease: Secondary | ICD-10-CM | POA: Diagnosis not present

## 2022-03-06 LAB — CBC
HCT: 27.7 % — ABNORMAL LOW (ref 39.0–52.0)
Hemoglobin: 8.7 g/dL — ABNORMAL LOW (ref 13.0–17.0)
MCH: 26.7 pg (ref 26.0–34.0)
MCHC: 31.4 g/dL (ref 30.0–36.0)
MCV: 85 fL (ref 80.0–100.0)
Platelets: 192 10*3/uL (ref 150–400)
RBC: 3.26 MIL/uL — ABNORMAL LOW (ref 4.22–5.81)
RDW: 17.6 % — ABNORMAL HIGH (ref 11.5–15.5)
WBC: 9.1 10*3/uL (ref 4.0–10.5)
nRBC: 0 % (ref 0.0–0.2)

## 2022-03-06 LAB — BASIC METABOLIC PANEL
Anion gap: 11 (ref 5–15)
BUN: 46 mg/dL — ABNORMAL HIGH (ref 8–23)
CO2: 25 mmol/L (ref 22–32)
Calcium: 8.2 mg/dL — ABNORMAL LOW (ref 8.9–10.3)
Chloride: 98 mmol/L (ref 98–111)
Creatinine, Ser: 5.66 mg/dL — ABNORMAL HIGH (ref 0.61–1.24)
GFR, Estimated: 10 mL/min — ABNORMAL LOW (ref >60)
Glucose, Bld: 226 mg/dL — ABNORMAL HIGH (ref 70–99)
Potassium: 3.6 mmol/L (ref 3.5–5.1)
Sodium: 134 mmol/L — ABNORMAL LOW (ref 135–145)

## 2022-03-06 LAB — GLUCOSE, CAPILLARY
Glucose-Capillary: 177 mg/dL — ABNORMAL HIGH (ref 70–99)
Glucose-Capillary: 139 mg/dL — ABNORMAL HIGH (ref 70–99)
Glucose-Capillary: 169 mg/dL — ABNORMAL HIGH (ref 70–99)
Glucose-Capillary: 267 mg/dL — ABNORMAL HIGH (ref 70–99)

## 2022-03-06 LAB — CULTURE, BODY FLUID W GRAM STAIN -BOTTLE
Culture: NO GROWTH
Special Requests: ADEQUATE

## 2022-03-06 LAB — MAGNESIUM: Magnesium: 2 mg/dL (ref 1.7–2.4)

## 2022-03-06 MED ORDER — CHLORHEXIDINE GLUCONATE CLOTH 2 % EX PADS
6.0000 | MEDICATED_PAD | Freq: Every day | CUTANEOUS | Status: DC
  Administered 2022-03-06 – 2022-03-08 (×3): 6 via TOPICAL

## 2022-03-06 MED ORDER — AMIODARONE HCL 200 MG PO TABS
400.0000 mg | ORAL_TABLET | Freq: Two times a day (BID) | ORAL | Status: DC
  Administered 2022-03-06 – 2022-03-08 (×5): 400 mg via ORAL
  Filled 2022-03-06 (×5): qty 2

## 2022-03-06 MED ORDER — ASPIRIN 325 MG PO TABS
325.0000 mg | ORAL_TABLET | Freq: Every day | ORAL | Status: DC
  Administered 2022-03-06 – 2022-03-08 (×3): 325 mg via ORAL
  Filled 2022-03-06 (×2): qty 1

## 2022-03-06 NOTE — Progress Notes (Signed)
PROGRESS NOTE  [name] JSE:[id number] DOB: 1956/02/13 DOA: 02/28/2022 PCP: [name], MD  Brief History:  66 y.o. male with medical history significant for CABG, ESRD, cardiomyopathy with systolic and diastolic CHF, stroke, hypertension, diabetes mellitus. Patient was brought to the ED from dialysis center with reports of worsening of his difficulty breathing.  At the time of my evaluation, patient is on BiPAP. Patient was having dialysis today and was 1 and 54 minutes into session when it had to be stopped.  He was having significant worsening of his breathing, with use of accessory muscle, was placed on 4 L nasal cannula, and BiPAP here. Patient reports has been having difficulty breathing over the past 2 days.  He reported cough productive of whitish phlegm.  On arrival to the ED there was some wheezing.   Recent hospitalization 8/19 to 8/25 for chest pain, transferred to [facility], due to history of previous CABG.  Underwent cardiac cath 8/22 which showed patent CABG.   ED Course: Temperature 98.9.  Initial heart rate 103 improved to 70s, respiratory rate initially up to 30 improved after BiPAP, blood pressure 120s to 170s.  O2 sats 90% on 4 L nasal cannula, subsequently switched to BiPAP due to tachycardia, tachypnea, accessory muscle use and increased work of breathing. Chest x-ray with mild cardiomegaly without overt pulmonary edema, small right pleural effusion slightly increased, hazy right lung base opacity favor atelectasis. VBG with pH of 7.3, PCO2 of 43.  Lactic acid 3.5.  Troponin 51.   IV ceftriaxone and azithromycin started. Initial concern for flash pulmonary edema, hence nitroglycerin drip initially started and later discontinued with improvement in respiratory status. However, the patient was noted to have gram-negative bacteremia, and he was started on cefepime empirically.  On the morning of 03/03/2022, the patient was a little more somnolent and  confused.  However, he woke up to voice and was able to answer questions appropriately when he was awake.  Work-up was undertaken to clarify etiology of his altered mental status.     Assessment and Plan: * Acute respiratory failure with hypoxia (HCC) Difficulty breathing over the past 2 days PTA -placed on BiPAP due to increased work of breathing.   -Respiratory status has improved after thora (1.3L removed).  Quit smoking cigarettes about 33 years ago. . -Started on BiPAP and nitroglycerin drip which was discontinued after improvement in respiratory status. -Hold off on further antibiotics at this time -Obtain CTA chest, I talked to nephrologist Dr. [name], okay to administer IV contrast at this time -Lactic acid 3.5 > 1.9 resolved without intervention, likely secondary to hypoxia, doubt infection. -Addendum-no PE, shows large right pleural effusion increased in size from prior.  Likely etiology for his hypoxia.  -ordered ultrasound-guided thoracentesis>>1.3L -now stable on RA  DM type 2 causing vascular disease (HCC) A1c 7.7.  Glucose 176. - continue SSI -CBGs largely controlled   Essential hypertension, benign Systolic 073X to 106Y. -Resume doxazosin, Imdur, metoprolol  -hydralazine restarted by cardiology team 9/19>>hold for now due to soft BPs  Acute metabolic encephalopathy 6/94/8546 AM--patient is somnolent but arouses to voice and answering questions appropriately -CT brain--neg -Ammonia--32 -CBG 151 -Check VBG 7.5/35/59/27 RA B12--564 -Discontinue any hypnotic medications -May be related to cefepime>>switch to [facility] -mental status now improved  Acute on chronic combined systolic and diastolic CHF (congestive heart failure) ([place]) 07/25/21 Echo--EF 40-45%, no WMA, mod RV dysfx, PASP 56.5 - BNP >4500, large right  effusion on CT - transiently on NG gtt in setting of HTN now off.  - fluid management per HD 03/01/22 Echo--EF 40-45%, global HK, RV  overload -03/02/2022 UF 1.8L 03/04/22-UF 2.2L Volume overload status improving  SVT (supraventricular tachycardia) ([place]) --Pt found unresponsive in sustained SVT on 9/18, resolved after vagal maneuvers -- appreciate cardiology -continued to have SVT 9/20 evening, one episode 9/21 evening -one episode 9/22 evening lasting 8 sec -[phone]>>>no SVT episodes -switched coreg to metoprolol -started amiodarone drip>>continue  -continue metoprolol 75 mg bid -overall improving - switch to po amio today   Pleural effusion on right -- likely major contributor to his dyspneic symptoms -- 9/19 thora>>1.3L--appears exudative by Light's criteria -cytology negative -repeat attempt 03/04/22>> not enough fluid  Goals of care, counseling/discussion Confirmed with pt and spouse = DNR  Dysphagia Appears to be predominantly esophageal etiology Evaluated by speech>>dys1 with thin, adv as tolerated  Hemoptysis 03/03/22 mod amounts Appreciate pulm consult Appears resolved May be related to aspiration/pulm HTN Monitor H/H -stable on RA  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 [name] --considered poor candidate for esophagectomy. After multidisciplinary discussion the plan was to continue with endoscopic treatments, and continue to follow-up with oncology for surveillance and any signs of progression of disease -status post EMR, cryotherapy, and most recently RFA in April 2023 -02/10/22 PET scan--no local recurrence   DNR (do not resuscitate) --continue DNR order while in hospital   gram neg rod Bacteremia -E. [place]ae -Due to altered mental status, change antibiotics to merrem -repeat blood culture neg to date  ESRD on dialysis [facility]) Started dialysis within the past month.  Schedule Monday Wednesday Friday.  HD session terminated today due to sudden worsening of his dyspnea.  I talked with Dr. Colodonato>>, okay with IV contrast for now. -nephrology consulted to manage  inpatient HD -03/02/22 UF 1.8L -03/04/22 UF 2.2L -HD 03/04/22   Obstructive sleep apnea BIPAP QHS while hospitalized.  S/P CABG (coronary artery bypass graft) Recent cardiac cath 8/22, showed patent grafts, severe three-vessel native CAD.Marland Kitchen  History of CABG 2020.  Troponin today 51 > 52, not consistent with ACS -Resume aspirin, metoprolol, Isordil   -No chest pain -03/01/2022 echo EF 40-45%, global HK, mild TR, RV overload      Code Status:  FULL  DVT Prophylaxis:  [place] Heparin      Procedures: As Listed in Progress Note Above   Antibiotics: Cefepime 9/19>>9/21 Merrem 9/21>>            Subjective: Patient denies fevers, chills, headache, chest pain, dyspnea, nausea, vomiting, diarrhea, abdominal pain, dysuria, hematuria, hematochezia, and melena. No hemoptysis  Objective: Vitals:   03/06/22 1000 03/06/22 1100 03/06/22 1130 03/06/22 1200  BP: (!) 132/48 (!) 173/52  (!) 118/48  Pulse: 69 69 68 66  Resp: (!) '24 19 18 17  '$ Temp:   98.4 F (36.9 C)   TempSrc:   Oral   SpO2: 92% 92% (!) 89% 93%  Weight:      Height:        Intake/Output Summary (Last 24 hours) at 03/06/2022 1255 Last data filed at 03/05/2022 2000 Gross per 24 hour  Intake 621.15 ml  Output --  Net 621.15 ml   Weight change: -2.8 kg Exam:  General:  Pt is alert, follows commands appropriately, not in acute distress HEENT: No icterus, No thrush, No neck mass, [place]/AT Cardiovascular: RRR, S1/S2, no rubs, no gallops Respiratory: bibasilar rales. No wheeze Abdomen: Soft/+BS, non tender, non distended, no guarding  Extremities: No edema, No lymphangitis, No petechiae, No rashes, no synovitis   Data Reviewed: I have personally reviewed following labs and imaging studies Basic Metabolic Panel: Recent Labs  Lab 03/01/22 0040 03/01/22 0437 03/01/22 2123 03/02/22 0238 03/03/22 0612 03/04/22 0336 03/06/22 0413  NA 135   < > 134* 135 135 136 134*  K 4.0   < > 3.3* 3.7 3.5 3.9 3.6  CL 100   < > 98  99 100 100 98  CO2 21*   < > '24 23 24 23 25  '$ GLUCOSE 195*   < > 180* 194* 227* 151* 226*  BUN 56*   < > 49* 51* 43* 61* 46*  CREATININE 5.22*   < > 4.41* 4.76* 4.41* 5.60* 5.66*  CALCIUM 8.1*   < > 8.0* 8.1* 8.2* 8.2* 8.2*  MG 2.1  --  1.8 1.9  --  1.9 2.0  PHOS 6.1*  --   --  4.3 2.6  --   --    < > = values in this interval not displayed.   Liver Function Tests: Recent Labs  Lab 02/28/22 1238 03/02/22 0238 03/03/22 0612 03/03/22 1415  AST 33  --   --  15  ALT 32  --   --  21  ALKPHOS 135*  --   --  93  BILITOT 1.0  --   --  0.7  PROT 7.6  --   --  5.9*  ALBUMIN 4.0 3.3* 3.3* 3.1*   No results for input(s): "LIPASE", "AMYLASE" in the last 168 hours. Recent Labs  Lab 03/03/22 1415  AMMONIA 32   Coagulation Profile: Recent Labs  Lab 02/28/22 1238  INR 1.4*   CBC: Recent Labs  Lab 02/28/22 1238 03/01/22 0437 03/02/22 0238 03/03/22 0612 03/03/22 1415 03/04/22 0336 03/06/22 0413  WBC 8.8 7.0 6.3 5.7  --  7.5 9.1  NEUTROABS 8.2*  --   --   --   --  5.4  --   HGB 10.7* 9.2* 9.0* 9.4* 8.8* 8.5* 8.7*  HCT 35.0* 29.2* 28.5* 29.4* 28.1* 26.8* 27.7*  MCV 87.5 85.6 85.1 85.0  --  84.8 85.0  PLT 198 157 148* 154  --  152 192   Cardiac Enzymes: No results for input(s): "CKTOTAL", "CKMB", "CKMBINDEX", "TROPONINI" in the last 168 hours. BNP: Invalid input(s): "POCBNP" CBG: Recent Labs  Lab 03/05/22 1146 03/05/22 1608 03/05/22 2131 03/06/22 0718 03/06/22 1131  GLUCAP 178* 163* 194* 177* 139*   HbA1C: No results for input(s): "HGBA1C" in the last 72 hours. Urine analysis:    Component Value Date/Time   COLORURINE YELLOW 02/28/2022 1414   APPEARANCEUR CLEAR 02/28/2022 1414   LABSPEC 1.014 02/28/2022 1414   PHURINE 5.0 02/28/2022 1414   GLUCOSEU >=500 (A) 02/28/2022 1414   HGBUR NEGATIVE 02/28/2022 1414   BILIRUBINUR NEGATIVE 02/28/2022 1414   KETONESUR NEGATIVE 02/28/2022 1414   PROTEINUR >=300 (A) 02/28/2022 1414   NITRITE NEGATIVE 02/28/2022 1414    LEUKOCYTESUR NEGATIVE 02/28/2022 1414   Sepsis Labs: '@LABRCNTIP'$ (procalcitonin:4,lacticidven:4) ) Recent Results (from the past 240 hour(s))  Culture, blood (Routine x 2)     Status: None   Collection Time: 02/28/22 12:38 PM   Specimen: BLOOD RIGHT FOREARM  Result Value Ref Range Status   Specimen Description   Final    BLOOD RIGHT FOREARM BOTTLES DRAWN AEROBIC AND ANAEROBIC   Special Requests Blood Culture adequate volume  Final   Culture   Final    NO GROWTH 5 DAYS  Performed at [facility], [address], [place], [place] [address]    Report Status 03/05/2022 FINAL  Final  Culture, blood (Routine x 2)     Status: Abnormal   Collection Time: 02/28/22 12:43 PM   Specimen: Right Antecubital; Blood  Result Value Ref Range Status   Specimen Description   Final    RIGHT ANTECUBITAL BOTTLES DRAWN AEROBIC AND ANAEROBIC Performed at [facility], [address], [place], [place] [address]    Special Requests   Final    Blood Culture adequate volume Performed at [facility] [place], [place] [address]    Culture  Setup Time   Final    GRAM NEGATIVE RODS Gram Stain Report Called to,Read Back By and Verified With: L HILTON RN [id number] K FORSYTH IN BOTH AEROBIC AND ANAEROBIC BOTTLES CRITICAL RESULT CALLED TO, READ BACK BY AND VERIFIED WITH: Cloyde Reams [id number] '@1700'$  FH Performed at [facility], [address] [place],  [address]    Culture ENTEROBACTER CLOACAE (A)  Final   Report Status 03/03/2022 FINAL  Final   Organism ID, Bacteria ENTEROBACTER CLOACAE  Final      Susceptibility   Enterobacter cloacae - MIC*    CEFAZOLIN >=64 RESISTANT Resistant     CEFEPIME <=0.12 SENSITIVE Sensitive     CEFTAZIDIME <=1 SENSITIVE Sensitive     CIPROFLOXACIN <=0.25 SENSITIVE Sensitive     GENTAMICIN <=1 SENSITIVE Sensitive     IMIPENEM 0.5 SENSITIVE Sensitive     TRIMETH/SULFA <=20 SENSITIVE Sensitive     PIP/TAZO <=4 SENSITIVE Sensitive     * ENTEROBACTER CLOACAE   Blood Culture ID Panel (Reflexed)     Status: Abnormal   Collection Time: 02/28/22 12:43 PM  Result Value Ref Range Status   Enterococcus faecalis NOT DETECTED NOT DETECTED Final   Enterococcus Faecium NOT DETECTED NOT DETECTED Final   Listeria monocytogenes NOT DETECTED NOT DETECTED Final   Staphylococcus species NOT DETECTED NOT DETECTED Final   Staphylococcus aureus (BCID) NOT DETECTED NOT DETECTED Final   Staphylococcus epidermidis NOT DETECTED NOT DETECTED Final   Staphylococcus lugdunensis NOT DETECTED NOT DETECTED Final   Streptococcus species NOT DETECTED NOT DETECTED Final   Streptococcus agalactiae NOT DETECTED NOT DETECTED Final   Streptococcus pneumoniae NOT DETECTED NOT DETECTED Final   Streptococcus pyogenes NOT DETECTED NOT DETECTED Final   A.calcoaceticus-baumannii NOT DETECTED NOT DETECTED Final   Bacteroides fragilis NOT DETECTED NOT DETECTED Final   Enterobacterales DETECTED (A) NOT DETECTED Final    Comment: Enterobacterales represent a large order of gram negative bacteria, not a single organism. CRITICAL RESULT CALLED TO, READ BACK BY AND VERIFIED WITH: PHARMD S. HURTH [id number] '@1700'$  FH    Enterobacter cloacae complex DETECTED (A) NOT DETECTED Final    Comment: CRITICAL RESULT CALLED TO, READ BACK BY AND VERIFIED WITH: PHARMD S. Rogene [place] [id number] '@1700'$  FH    Escherichia coli NOT DETECTED NOT DETECTED Final   Klebsiella aerogenes NOT DETECTED NOT DETECTED Final   Klebsiella oxytoca NOT DETECTED NOT DETECTED Final   Klebsiella pneumoniae NOT DETECTED NOT DETECTED Final   Proteus species NOT DETECTED NOT DETECTED Final   Salmonella species NOT DETECTED NOT DETECTED Final   Serratia marcescens NOT DETECTED NOT DETECTED Final   Haemophilus influenzae NOT DETECTED NOT DETECTED Final   Neisseria meningitidis NOT DETECTED NOT DETECTED Final   Pseudomonas aeruginosa NOT DETECTED NOT DETECTED Final   Stenotrophomonas maltophilia NOT DETECTED NOT DETECTED Final   Candida  albicans NOT  DETECTED NOT DETECTED Final   Candida auris NOT DETECTED NOT DETECTED Final   Candida glabrata NOT DETECTED NOT DETECTED Final   Candida krusei NOT DETECTED NOT DETECTED Final   Candida parapsilosis NOT DETECTED NOT DETECTED Final   Candida tropicalis NOT DETECTED NOT DETECTED Final   Cryptococcus neoformans/gattii NOT DETECTED NOT DETECTED Final   CTX-M ESBL NOT DETECTED NOT DETECTED Final   Carbapenem resistance IMP NOT DETECTED NOT DETECTED Final   Carbapenem resistance KPC NOT DETECTED NOT DETECTED Final   Carbapenem resistance NDM NOT DETECTED NOT DETECTED Final   Carbapenem resist OXA 48 LIKE NOT DETECTED NOT DETECTED Final   Carbapenem resistance VIM NOT DETECTED NOT DETECTED Final    Comment: Performed at [facility] [address] [place], [place] [address]  MRSA Next Gen by PCR, Nasal     Status: None   Collection Time: 02/28/22 10:20 PM   Specimen: Nasal Mucosa; Nasal Swab  Result Value Ref Range Status   MRSA by PCR Next Gen NOT DETECTED NOT DETECTED Final    Comment: (NOTE) The GeneXpert MRSA Assay (FDA approved for NASAL specimens only), is one component of a comprehensive MRSA colonization surveillance program. It is not intended to diagnose MRSA infection nor to guide or monitor treatment for MRSA infections. Test performance is not FDA approved in patients less than 73 years old. Performed at [facility], [address], [place], [place] [address]   Gram stain     Status: None   Collection Time: 03/01/22 10:20 AM   Specimen: Pleura  Result Value Ref Range Status   Specimen Description PLEURAL  Final   Special Requests PLEURAL  Final   Gram Stain   Final    NO ORGANISMS SEEN WBC PRESENT,BOTH PMN AND MONONUCLEAR CYTOSPIN SMEAR Performed at [facility], [address], [place], [place] [address]    Report Status 03/01/2022 FINAL  Final  Culture, body fluid w Gram Stain-bottle     Status: None   Collection Time: 03/01/22 10:20 AM   Specimen:  Pleura  Result Value Ref Range Status   Specimen Description PLEURAL  Final   Special Requests   Final    BOTTLES DRAWN AEROBIC AND ANAEROBIC Blood Culture adequate volume   Culture   Final    NO GROWTH 5 DAYS Performed at [facility], [address], [place], [place] [address]    Report Status 03/06/2022 FINAL  Final  Culture, blood (Routine X 2) w Reflex to ID Panel     Status: None (Preliminary result)   Collection Time: 03/03/22  7:56 PM   Specimen: BLOOD RIGHT HAND  Result Value Ref Range Status   Specimen Description BLOOD RIGHT HAND  Final   Special Requests   Final    BOTTLES DRAWN AEROBIC AND ANAEROBIC Blood Culture adequate volume   Culture   Final    NO GROWTH 3 DAYS Performed at [facility], [address], [place],  [address]    Report Status PENDING  Incomplete  Culture, blood (Routine X 2) w Reflex to ID Panel     Status: None (Preliminary result)   Collection Time: 03/03/22  7:56 PM   Specimen: BLOOD RIGHT HAND  Result Value Ref Range Status   Specimen Description BLOOD RIGHT HAND  Final   Special Requests   Final    BOTTLES DRAWN AEROBIC AND ANAEROBIC Blood Culture adequate volume   Culture   Final    NO GROWTH 3 DAYS Performed at [facility], [address] [place], [place] [address]    Report Status PENDING  Incomplete     Scheduled Meds:  amiodarone  400 mg Oral BID   aspirin  325 mg Oral Daily   calcium acetate  667 mg Oral TID WC   Chlorhexidine Gluconate Cloth  6 each Topical Q0600   Chlorhexidine Gluconate Cloth  6 each Topical Q0600   darbepoetin (ARANESP) injection - DIALYSIS  60 mcg Intravenous Q Fri-HD   doxazosin  4 mg Oral Daily   feeding supplement (NEPRO CARB STEADY)  237 mL Oral BID BM   heparin  5,000 Units Subcutaneous Q8H   insulin aspart  0-5 Units Subcutaneous QHS   insulin aspart  0-9 Units Subcutaneous TID WC   isosorbide dinitrate  30 mg Oral TID   metoprolol tartrate  75 mg Oral BID   sertraline  100 mg Oral Daily    Continuous Infusions:  meropenem (MERREM) IV 500 mg (03/05/22 2316)    Procedures/Studies: [place] CHEST (PLEURAL EFFUSION)  Result Date: 03/04/2022 CLINICAL DATA:  Pleural effusion EXAM: CHEST ULTRASOUND COMPARISON:  Radiograph 03/03/2022 FINDINGS: There is a small right pleural effusion. IMPRESSION: Small right pleural effusion. Electronically Signed   By: Maurine Simmering M.D.   On: 03/04/2022 09:29   DG CHEST PORT 1 VIEW  Result Date: 03/03/2022 CLINICAL DATA:  Coughing up blood today EXAM: PORTABLE CHEST 1 VIEW COMPARISON:  Radiograph 03/01/2022 FINDINGS: Unchanged mild cardiomegaly. Prior median sternotomy and postsurgical changes of CABG. Right neck catheter tip overlies the right atrium. Central pulmonary vascular congestion. Stable small right pleural effusion. No evidence of pneumothorax. Bones are unchanged. IMPRESSION: Mild cardiomegaly and central pulmonary vascular congestion. Stable small right pleural effusion.  Bibasilar atelectasis. Electronically Signed   By: Maurine Simmering M.D.   On: 03/03/2022 16:41   CT ABDOMEN PELVIS WO CONTRAST  Result Date: 03/03/2022 CLINICAL DATA:  Abdominal pain, acute, nonlocalized. Recent thoracentesis. Esophageal cancer. EXAM: CT ABDOMEN AND PELVIS WITHOUT CONTRAST TECHNIQUE: Multidetector CT imaging of the abdomen and pelvis was performed following the standard protocol without IV contrast. RADIATION DOSE REDUCTION: This exam was performed according to the departmental dose-optimization program which includes automated exposure control, adjustment of the mA and/or kV according to patient size and/or use of iterative reconstruction technique. COMPARISON:  CT of the abdomen and pelvis 01/23/2022. PET scan 02/10/2022. FINDINGS: Lower chest: Moderate size right pleural effusion is present. Mild dependent atelectasis is associated. There is no scratched at no pneumothorax is present. Dense coronary artery calcifications are present. No significant pericardial  effusion is present. Hepatobiliary: No focal liver abnormality is seen. No gallstones, gallbladder wall thickening, or biliary dilatation. Pancreas: Unremarkable. No pancreatic ductal dilatation or surrounding inflammatory changes. Spleen: Normal in size without focal abnormality. Adrenals/Urinary Tract: Adrenal glands are within normal limits bilaterally. Stable bilateral simple cysts are present. Chronic stranding about both kidneys is stable. No other mass lesion or stone is present. No obstruction is present. Ureters are within normal limits. The urinary bladder is within normal limits. Stomach/Bowel: Stomach and duodenum are within normal limits. Small bowel is unremarkable. Terminal ileum is normal. The appendix is visualized and within normal limits. Loops of small bowel are present in the anterior right abdomen, likely representing adhesions. The ascending and transverse colon are within normal limits. Descending and sigmoid colon are normal. Vascular/Lymphatic: Atherosclerotic calcifications are present in the aorta and branch vessels. No aneurysm is present. Reproductive: Prostate measures 5.4 cm in transverse diameter. Other: No abdominal wall hernia  or abnormality. No abdominopelvic ascites. Musculoskeletal: Vertebral body heights and alignment are normal. No focal osseous abnormalities are present. Bony pelvis is within normal limits. Hips are located and normal. IMPRESSION: 1. Moderate size right pleural effusion and associated atelectasis. 2. No acute or focal lesion to explain the patient's abdominal pain. 3. Loops of small bowel in the anterior right abdomen, likely representing adhesions. No obstruction is associated. 4. Coronary artery disease. 5. Aortic Atherosclerosis (ICD10-I70.0). Electronically Signed   By: San Morelle M.D.   On: 03/03/2022 13:43   CT HEAD WO CONTRAST (5MM)  Result Date: 03/03/2022 CLINICAL DATA:  Altered mental status. EXAM: CT HEAD WITHOUT CONTRAST TECHNIQUE:  Contiguous axial images were obtained from the base of the skull through the vertex without intravenous contrast. RADIATION DOSE REDUCTION: This exam was performed according to the departmental dose-optimization program which includes automated exposure control, adjustment of the mA and/or kV according to patient size and/or use of iterative reconstruction technique. COMPARISON:  None Available. FINDINGS: Brain: Mild chronic ischemic white matter disease. No mass effect or midline shift is noted. Ventricular size is within normal limits. There is no evidence of mass lesion, hemorrhage or acute infarction. Vascular: No hyperdense vessel or unexpected calcification. Skull: Normal. Negative for fracture or focal lesion. Sinuses/Orbits: No acute finding. Other: None. IMPRESSION: No acute intracranial abnormality seen. Electronically Signed   By: Marijo Conception M.D.   On: 03/03/2022 13:31   ECHOCARDIOGRAM COMPLETE  Result Date: 03/01/2022    ECHOCARDIOGRAM REPORT   Patient Name:   JANET DECESARE Date of Exam: 03/01/2022 Medical Rec #:  [id number]        Height:       70.0 in Accession #:    [id number]       Weight:       181.4 lb Date of Birth:  [birth year]        BSA:          2.003 m Patient Age:    25 years         BP:           131/55 mmHg Patient Gender: M                HR:           69 bpm. Exam Location:  [facility] Procedure: 2D Echo, Cardiac Doppler and Color Doppler Indications:    Dyspnea  History:        Patient has prior history of Echocardiogram examinations, most                 recent 07/25/2021. CHF and Cardiomyopathy, CAD, Prior CABG,                 Stroke and PAD, Arrythmias:Tachycardia, Signs/Symptoms:Shortness                 of Breath, Edema and Chest Pain; Risk Factors:Hypertension,                 Diabetes and Dyslipidemia.  Sonographer:    Wenda Low Referring Phys: [id number] [place]  1. Left ventricular ejection fraction, by estimation, is 40 to 45%. The left  ventricle has mildly decreased function. The left ventricle demonstrates global hypokinesis. There is moderate left ventricular hypertrophy. Left ventricular diastolic parameters are indeterminate.  2. Ventricular septum is flattened in systole consistent with RV pressure overload. . Right ventricular systolic function is moderately reduced. The right ventricular size is severely enlarged. There  is mildly elevated pulmonary artery systolic pressure.  3. Left atrial size was severely dilated.  4. Catheter tip noted in RA. Right atrial size was severely dilated.  5. The mitral valve is normal in structure. No evidence of mitral valve regurgitation. No evidence of mitral stenosis.  6. The tricuspid valve is abnormal.  7. The aortic valve is tricuspid. There is mild calcification of the aortic valve. There is mild thickening of the aortic valve. Aortic valve regurgitation is not visualized. No aortic stenosis is present.  8. The inferior vena cava is dilated in size with >50% respiratory variability, suggesting right atrial pressure of 8 mmHg. FINDINGS  Left Ventricle: Left ventricular ejection fraction, by estimation, is 40 to 45%. The left ventricle has mildly decreased function. The left ventricle demonstrates global hypokinesis. The left ventricular internal cavity size was normal in size. There is  moderate left ventricular hypertrophy. Left ventricular diastolic parameters are indeterminate. Right Ventricle: Ventricular septum is flattened in systole consistent with RV pressure overload. The right ventricular size is severely enlarged. Right vetricular wall thickness was not well visualized. Right ventricular systolic function is moderately reduced. There is mildly elevated pulmonary artery systolic pressure. The tricuspid regurgitant velocity is 2.91 m/s, and with an assumed right atrial pressure of 8 mmHg, the estimated right ventricular systolic pressure is 44.0 mmHg. Left Atrium: Left atrial size was severely  dilated. Right Atrium: Catheter tip noted in RA. Right atrial size was severely dilated. Pericardium: There is no evidence of pericardial effusion. Mitral Valve: The mitral valve is normal in structure. No evidence of mitral valve regurgitation. No evidence of mitral valve stenosis. MV peak gradient, 4.2 mmHg. The mean mitral valve gradient is 2.0 mmHg. Tricuspid Valve: The tricuspid valve is abnormal. Tricuspid valve regurgitation is mild . No evidence of tricuspid stenosis. Aortic Valve: The aortic valve is tricuspid. There is mild calcification of the aortic valve. There is mild thickening of the aortic valve. There is mild aortic valve annular calcification. Aortic valve regurgitation is not visualized. No aortic stenosis  is present. Aortic valve mean gradient measures 3.5 mmHg. Aortic valve peak gradient measures 7.2 mmHg. Aortic valve area, by VTI measures 2.94 cm. Pulmonic Valve: The pulmonic valve was not well visualized. Pulmonic valve regurgitation is not visualized. No evidence of pulmonic stenosis. Aorta: The aortic root is normal in size and structure. Venous: The inferior vena cava is dilated in size with greater than 50% respiratory variability, suggesting right atrial pressure of 8 mmHg. IAS/Shunts: No atrial level shunt detected by color flow Doppler.  LEFT VENTRICLE PLAX 2D LVIDd:         5.40 cm      Diastology LVIDs:         4.30 cm      LV e' medial:    5.87 cm/s LV PW:         1.40 cm      LV E/e' medial:  14.5 LV IVS:        1.20 cm      LV e' lateral:   11.70 cm/s LVOT diam:     2.10 cm      LV E/e' lateral: 7.3 LV SV:         81 LV SV Index:   41 LVOT Area:     3.46 cm  LV Volumes (MOD) LV vol d, MOD A2C: 118.0 ml LV vol d, MOD A4C: 98.3 ml LV vol s, MOD A2C: 85.2 ml LV vol s, MOD A4C:  48.6 ml LV SV MOD A2C:     32.8 ml LV SV MOD A4C:     98.3 ml LV SV MOD BP:      39.3 ml RIGHT VENTRICLE RV Basal diam:  4.80 cm RV Mid diam:    3.50 cm RV S prime:     10.10 cm/s TAPSE (M-mode): 1.7 cm  LEFT ATRIUM              Index        RIGHT ATRIUM           Index LA diam:        5.80 cm  2.90 cm/m   RA Area:     26.40 cm LA Vol (A2C):   140.0 ml 69.91 ml/m  RA Volume:   84.40 ml  42.14 ml/m LA Vol (A4C):   91.4 ml  45.64 ml/m LA Biplane Vol: 114.0 ml 56.92 ml/m  AORTIC VALVE                    PULMONIC VALVE AV Area (Vmax):    2.66 cm     PV Vmax:       0.87 m/s AV Area (Vmean):   2.71 cm     PV Peak grad:  3.0 mmHg AV Area (VTI):     2.94 cm AV Vmax:           134.00 cm/s AV Vmean:          88.233 cm/s AV VTI:            0.277 m AV Peak Grad:      7.2 mmHg AV Mean Grad:      3.5 mmHg LVOT Vmax:         103.00 cm/s LVOT Vmean:        69.000 cm/s LVOT VTI:          0.235 m LVOT/AV VTI ratio: 0.85  AORTA Ao Root diam: 3.90 cm Ao Asc diam:  3.40 cm MITRAL VALVE               TRICUSPID VALVE MV Area (PHT): 3.05 cm    TR Peak grad:   33.9 mmHg MV Area VTI:   2.56 cm    TR Vmax:        291.00 cm/s MV Peak grad:  4.2 mmHg MV Mean grad:  2.0 mmHg    SHUNTS MV Vmax:       1.02 m/s    Systemic VTI:  0.24 m MV Vmean:      61.2 cm/s   Systemic Diam: 2.10 cm MV Decel Time: 249 msec MV E velocity: 85.30 cm/s MV A velocity: 75.80 cm/s MV E/A ratio:  1.13 Carlyle Dolly MD Electronically signed by Carlyle Dolly MD Signature Date/Time: 03/01/2022/1:08:36 PM    Final    DG Chest 1 View  Result Date: 03/01/2022 CLINICAL DATA:  Status post right thoracentesis. EXAM: CHEST  1 VIEW COMPARISON:  CT chest and chest x-ray from yesterday. FINDINGS: Unchanged tunneled right internal jugular dialysis catheter. Unchanged mild cardiomegaly status post CABG. Decreased now trace small right pleural effusion. No pneumothorax. Improved aeration of the right lower lobe. No acute osseous abnormality. IMPRESSION: 1. Decreased right pleural effusion status post thoracentesis. No pneumothorax. Electronically Signed   By: Titus Dubin M.D.   On: 03/01/2022 11:26   US THORACENTESIS ASP PLEURAL SPACE W/IMG GUIDE  Result Date:  03/01/2022 INDICATION: Right pleural effusion. EXAM: ULTRASOUND GUIDED RIGHT THORACENTESIS  MEDICATIONS: None. COMPLICATIONS: None immediate. PROCEDURE: An ultrasound guided thoracentesis was thoroughly discussed with the patient and questions answered. The benefits, risks, alternatives and complications were also discussed. The patient understands and wishes to proceed with the procedure. Written consent was obtained. Ultrasound was performed to localize and mark an adequate pocket of fluid in the right chest. The area was then prepped and draped in the normal sterile fashion. 1% Lidocaine was used for local anesthesia. Under ultrasound guidance a 6 Fr Safe-T-Centesis catheter was introduced. Thoracentesis was performed. The catheter was removed and a dressing applied. FINDINGS: A total of approximately 1.3 L of dark yellow fluid was removed. Samples were sent to the laboratory as requested by the clinical team. IMPRESSION: Successful ultrasound guided right thoracentesis yielding 1.3 L of pleural fluid. Electronically Signed   By: Titus Dubin M.D.   On: 03/01/2022 11:23   CT Angio Chest Pulmonary Embolism (PE) W or WO Contrast  Result Date: 02/28/2022 CLINICAL DATA:  Acute respiratory failure, cough, shortness of breath, hypoxia EXAM: CT ANGIOGRAPHY CHEST WITH CONTRAST TECHNIQUE: Multidetector CT imaging of the chest was performed using the standard protocol during bolus administration of intravenous contrast. Multiplanar CT image reconstructions and MIPs were obtained to evaluate the vascular anatomy. RADIATION DOSE REDUCTION: This exam was performed according to the departmental dose-optimization program which includes automated exposure control, adjustment of the mA and/or kV according to patient size and/or use of iterative reconstruction technique. CONTRAST:  44m OMNIPAQUE IOHEXOL 350 MG/ML SOLN COMPARISON:  01/23/2022 FINDINGS: Cardiovascular: There is homogeneous enhancement in thoracic aorta.  Coronary artery calcifications are seen. There is previous coronary artery bypass surgery. There is ectasia of main pulmonary artery measuring 4.1 cm suggesting pulmonary arterial hypertension. There are no intraluminal filling defects in central pulmonary artery branches. Evaluation of small peripheral pulmonary artery branches is limited by motion artifacts and atelectasis. Heart is enlarged in size. There is reflux of contrast into hepatic veins suggesting tricuspid incompetence. Tip of right IJ central venous catheter is seen in the region of right atrium. Mediastinum/Nodes: No new significant lymphadenopathy seen. Lungs/Pleura: There is large right pleural effusion with significant interval increase. Density measurements in the right pleural effusion are less than 20 Hounsfield units. There is minimal left pleural effusion. There is compression atelectasis in right lower lobe and to a lesser extent in right middle lobe and right upper lobe. No focal infiltrates are seen in left lung. There is no pneumothorax. Upper Abdomen: There is fatty infiltration in the liver. Spleen is enlarged measuring 15.4 cm in AP diameter. Multiple diverticula are seen in visualized portions of colon. Musculoskeletal: Deformity in the right tenth rib may suggest old ununited fracture. Review of the MIP images confirms the above findings. IMPRESSION: There is no evidence of central pulmonary artery embolism. Evaluation of small peripheral pulmonary artery branches is limited by motion artifacts. There is ectasia of main pulmonary artery suggesting pulmonary arterial hypertension. Coronary artery disease. Previous coronary artery bypass surgery. Cardiomegaly. Tricuspid incompetence. There is large right pleural effusion with significant interval increase in size. Compression atelectasis is seen in right lung, especially in right lower lobe. There is minimal left pleural effusion. Fatty liver.  Enlarged spleen.  Diverticulosis of colon.  Other findings as described in the body of the report. Electronically Signed   By: PElmer PickerM.D.   On: 02/28/2022 18:07   DG Chest Port 1 View  Result Date: 02/28/2022 CLINICAL DATA:  Chest pain and dyspnea EXAM: PORTABLE CHEST 1 VIEW COMPARISON:  01/29/2022 chest radiograph. FINDINGS: Intact sternotomy wires. Pacer pad overlies the left chest. Right internal jugular central venous catheter terminates over the cavoatrial junction. Stable cardiomediastinal silhouette with mild cardiomegaly. No pneumothorax. Small right pleural effusion is slightly increased. No left pleural effusion. Slightly low lung volumes, similar. Cephalization of the pulmonary vasculature without overt pulmonary edema. Mild hazy right lung base opacity is mildly increased. IMPRESSION: 1. Stable mild cardiomegaly without overt pulmonary edema. 2. Small right pleural effusion, slightly increased. 3. Mildly increased hazy right lung base opacity, favor atelectasis. Electronically Signed   By: Ilona Sorrel M.D.   On: 02/28/2022 13:40   NM PET Image Restage (PS) Skull Base to Thigh (F-18 FDG)  Result Date: 02/11/2022 CLINICAL DATA:  Subsequent treatment strategy for esophageal carcinoma. EXAM: NUCLEAR MEDICINE PET SKULL BASE TO THIGH TECHNIQUE: 9.91 mCi F-18 FDG was injected intravenously. Full-ring PET imaging was performed from the skull base to thigh after the radiotracer. CT data was obtained and used for attenuation correction and anatomic localization. Fasting blood glucose: 228 mg/dl COMPARISON:  07/15/2021 FINDINGS: Mediastinal blood pool activity: SUV max 2.22 Liver activity: SUV max NA NECK: No hypermetabolic lymph nodes in the neck. Incidental CT findings: None CHEST: No tracer avid supraclavicular, axillary, mediastinal, or hilar lymph nodes. No abnormal hypermetabolism associated with the esophagus. No tracer avid pulmonary nodules. Small to moderate right pleural effusion is again noted. Incidental CT findings:  Cardiac enlargement. Aortic atherosclerosis. Status post CABG. ABDOMEN/PELVIS: No abnormal hypermetabolic activity within the liver, pancreas, adrenal glands, or spleen. No hypermetabolic lymph nodes in the abdomen or pelvis. Incidental CT findings: Bilateral fluid density kidney cysts are again noted compatible with simple cysts. No follow-up imaging recommended. Similar appearance of nonspecific bilateral perinephric fat stranding. Decrease abdominal ascites. Sigmoid diverticulosis without signs of acute diverticulitis. SKELETON: No focal hypermetabolic activity to suggest skeletal metastasis. Incidental CT findings: Subacute to chronic scratch set healing right posterior rib fractures are again noted. Chronic right anterior rib fracture deformities are also again noted. IMPRESSION: 1. There are no signs to suggest residual or recurrent FDG avid tumor within the esophagus. No FDG avid adenopathy or signs of solid organ metastasis. 2. Persistent right pleural effusion.  Decrease in ascites. 3. Cardiac enlargement. Status post CABG. Aortic Atherosclerosis (ICD10-I70.0). Electronically Signed   By: Kerby Moors M.D.   On: 02/11/2022 07:47    Orson Eva, DO  [facility]  If 7PM-7AM, please contact night-coverage [email or web address] Password [facility] 03/06/2022, 12:55 PM   LOS: 6 days

## 2022-03-06 NOTE — Plan of Care (Signed)
  Problem: Education: Goal: Knowledge of General Education information will improve Description Including pain rating scale, medication(s)/side effects and non-pharmacologic comfort measures Outcome: Progressing   Problem: Health Behavior/Discharge Planning: Goal: Ability to manage health-related needs will improve Outcome: Progressing   

## 2022-03-06 NOTE — Assessment & Plan Note (Signed)
Confirmed with pt and spouse = DNR

## 2022-03-07 DIAGNOSIS — R1310 Dysphagia, unspecified: Secondary | ICD-10-CM

## 2022-03-07 DIAGNOSIS — I5020 Unspecified systolic (congestive) heart failure: Secondary | ICD-10-CM

## 2022-03-07 DIAGNOSIS — K222 Esophageal obstruction: Secondary | ICD-10-CM

## 2022-03-07 DIAGNOSIS — I471 Supraventricular tachycardia: Secondary | ICD-10-CM | POA: Diagnosis not present

## 2022-03-07 DIAGNOSIS — G9341 Metabolic encephalopathy: Secondary | ICD-10-CM | POA: Diagnosis not present

## 2022-03-07 DIAGNOSIS — I5043 Acute on chronic combined systolic (congestive) and diastolic (congestive) heart failure: Secondary | ICD-10-CM | POA: Diagnosis not present

## 2022-03-07 DIAGNOSIS — J9601 Acute respiratory failure with hypoxia: Secondary | ICD-10-CM | POA: Diagnosis not present

## 2022-03-07 DIAGNOSIS — R7881 Bacteremia: Secondary | ICD-10-CM | POA: Diagnosis not present

## 2022-03-07 LAB — RENAL FUNCTION PANEL
Albumin: 2.8 g/dL — ABNORMAL LOW (ref 3.5–5.0)
Anion gap: 13 (ref 5–15)
BUN: 63 mg/dL — ABNORMAL HIGH (ref 8–23)
CO2: 24 mmol/L (ref 22–32)
Calcium: 8.2 mg/dL — ABNORMAL LOW (ref 8.9–10.3)
Chloride: 98 mmol/L (ref 98–111)
Creatinine, Ser: 7.06 mg/dL — ABNORMAL HIGH (ref 0.61–1.24)
GFR, Estimated: 8 mL/min — ABNORMAL LOW (ref >60)
Glucose, Bld: 237 mg/dL — ABNORMAL HIGH (ref 70–99)
Phosphorus: 4.2 mg/dL (ref 2.5–4.6)
Potassium: 4.1 mmol/L (ref 3.5–5.1)
Sodium: 135 mmol/L (ref 135–145)

## 2022-03-07 LAB — GLUCOSE, CAPILLARY
Glucose-Capillary: 197 mg/dL — ABNORMAL HIGH (ref 70–99)
Glucose-Capillary: 162 mg/dL — ABNORMAL HIGH (ref 70–99)
Glucose-Capillary: 119 mg/dL — ABNORMAL HIGH (ref 70–99)
Glucose-Capillary: 255 mg/dL — ABNORMAL HIGH (ref 70–99)

## 2022-03-07 LAB — CBC
HCT: 28 % — ABNORMAL LOW (ref 39.0–52.0)
Hemoglobin: 8.9 g/dL — ABNORMAL LOW (ref 13.0–17.0)
MCH: 27.2 pg (ref 26.0–34.0)
MCHC: 31.8 g/dL (ref 30.0–36.0)
MCV: 85.6 fL (ref 80.0–100.0)
Platelets: 244 10*3/uL (ref 150–400)
RBC: 3.27 MIL/uL — ABNORMAL LOW (ref 4.22–5.81)
RDW: 17.4 % — ABNORMAL HIGH (ref 11.5–15.5)
WBC: 9 10*3/uL (ref 4.0–10.5)
nRBC: 0 % (ref 0.0–0.2)

## 2022-03-07 MED ORDER — HYDRALAZINE HCL 25 MG PO TABS
25.0000 mg | ORAL_TABLET | Freq: Three times a day (TID) | ORAL | Status: DC
  Administered 2022-03-07 – 2022-03-08 (×4): 25 mg via ORAL
  Filled 2022-03-07 (×4): qty 1

## 2022-03-07 MED ORDER — SODIUM CHLORIDE 0.9 % IV SOLN
INTRAVENOUS | Status: DC
  Administered 2022-03-08: 1000 mL via INTRAVENOUS

## 2022-03-07 MED ORDER — METOPROLOL SUCCINATE ER 50 MG PO TB24
100.0000 mg | ORAL_TABLET | Freq: Every day | ORAL | Status: DC
  Filled 2022-03-07: qty 2

## 2022-03-07 MED ORDER — PANTOPRAZOLE SODIUM 40 MG PO TBEC
40.0000 mg | DELAYED_RELEASE_TABLET | Freq: Two times a day (BID) | ORAL | Status: DC
  Administered 2022-03-07 – 2022-03-08 (×3): 40 mg via ORAL
  Filled 2022-03-07 (×3): qty 1

## 2022-03-07 NOTE — Procedures (Signed)
   HEMODIALYSIS TREATMENT NOTE:   Uneventful 4 hour heparin-free treatment completed using RIJ TDC. Cath entry site is unremarkable. Goal met: 3.5 liters removed without interruption in UF.  All blood was returned.  No changes from pre-HD assessment.  Hand-off given to Rulon Sera, RN.   Rockwell Alexandria, RN

## 2022-03-07 NOTE — Progress Notes (Signed)
Patient ID: [name], male   DOB: 06/09/1956, 66 y.o.   MRN: [id number] S: Had some N/V this am but feeling better now. O:BP (!) 172/61   Pulse 72   Temp 98.1 F (36.7 C) (Oral)   Resp 18   Ht '5\' 10"'$  (1.778 m)   Wt 82.5 kg   SpO2 96%   BMI 26.10 kg/m   Intake/Output Summary (Last 24 hours) at 03/07/2022 3474 Last data filed at 03/06/2022 2000 Gross per 24 hour  Intake 206.86 ml  Output 200 ml  Net 6.86 ml   Intake/Output: I/O last 3 completed shifts: In: 273.6 [I.V.:273.6] Out: 200 [Urine:200]  Intake/Output this shift:  No intake/output data recorded. Weight change: 2.3 kg Gen: NAD CVS: RRR Resp: CTA Abd: +BS, soft, NT/ND Ext: no edema, LUE AVF +T/B  Recent Labs  Lab 02/28/22 1238 03/01/22 0040 03/01/22 0437 03/01/22 2123 03/02/22 0238 03/03/22 0612 03/03/22 1415 03/04/22 0336 03/06/22 0413  NA 138 135 137 134* 135 135  --  136 134*  K 4.4 4.0 4.1 3.3* 3.7 3.5  --  3.9 3.6  CL 99 100 102 98 99 100  --  100 98  CO2 20* 21* '23 24 23 24  '$ --  23 25  GLUCOSE 176* 195* 183* 180* 194* 227*  --  151* 226*  BUN 48* 56* 59* 49* 51* 43*  --  61* 46*  CREATININE 4.47* 5.22* 5.38* 4.41* 4.76* 4.41*  --  5.60* 5.66*  ALBUMIN 4.0  --   --   --  3.3* 3.3* 3.1*  --   --   CALCIUM 8.7* 8.1* 8.3* 8.0* 8.1* 8.2*  --  8.2* 8.2*  PHOS  --  6.1*  --   --  4.3 2.6  --   --   --   AST 33  --   --   --   --   --  15  --   --   ALT 32  --   --   --   --   --  21  --   --    Liver Function Tests: Recent Labs  Lab 02/28/22 1238 03/02/22 0238 03/03/22 0612 03/03/22 1415  AST 33  --   --  15  ALT 32  --   --  21  ALKPHOS 135*  --   --  93  BILITOT 1.0  --   --  0.7  PROT 7.6  --   --  5.9*  ALBUMIN 4.0 3.3* 3.3* 3.1*   No results for input(s): "LIPASE", "AMYLASE" in the last 168 hours. Recent Labs  Lab 03/03/22 1415  AMMONIA 32   CBC: Recent Labs  Lab 02/28/22 1238 03/01/22 0437 03/02/22 0238 03/03/22 0612 03/03/22 1415 03/04/22 0336 03/06/22 0413  WBC 8.8  7.0 6.3 5.7  --  7.5 9.1  NEUTROABS 8.2*  --   --   --   --  5.4  --   HGB 10.7* 9.2* 9.0* 9.4* 8.8* 8.5* 8.7*  HCT 35.0* 29.2* 28.5* 29.4* 28.1* 26.8* 27.7*  MCV 87.5 85.6 85.1 85.0  --  84.8 85.0  PLT 198 157 148* 154  --  152 192   Cardiac Enzymes: No results for input(s): "CKTOTAL", "CKMB", "CKMBINDEX", "TROPONINI" in the last 168 hours. CBG: Recent Labs  Lab 03/06/22 0718 03/06/22 1131 03/06/22 1600 03/06/22 2113 03/07/22 0722  GLUCAP 177* 139* 169* 267* 197*    Iron Studies: No results for input(s): "IRON", "TIBC", "TRANSFERRIN", "  FERRITIN" in the last 72 hours. Studies/Results: No results found.  amiodarone  400 mg Oral BID   aspirin  325 mg Oral Daily   calcium acetate  667 mg Oral TID WC   Chlorhexidine Gluconate Cloth  6 each Topical Q0600   Chlorhexidine Gluconate Cloth  6 each Topical Q0600   darbepoetin (ARANESP) injection - DIALYSIS  60 mcg Intravenous Q Fri-HD   doxazosin  4 mg Oral Daily   feeding supplement (NEPRO CARB STEADY)  237 mL Oral BID BM   heparin  5,000 Units Subcutaneous Q8H   hydrALAZINE  25 mg Oral Q8H   insulin aspart  0-5 Units Subcutaneous QHS   insulin aspart  0-9 Units Subcutaneous TID WC   isosorbide dinitrate  30 mg Oral TID   [START ON 03/08/2022] metoprolol succinate  100 mg Oral Daily   metoprolol tartrate  75 mg Oral BID   sertraline  100 mg Oral Daily    BMET    Component Value Date/Time   NA 134 (L) 03/06/2022 0413   NA 138 10/07/2021 1202   K 3.6 03/06/2022 0413   CL 98 03/06/2022 0413   CO2 25 03/06/2022 0413   GLUCOSE 226 (H) 03/06/2022 0413   BUN 46 (H) 03/06/2022 0413   BUN 42 (H) 10/07/2021 1202   CREATININE 5.66 (H) 03/06/2022 0413   CREATININE 1.94 (H) 05/13/2019 1029   CALCIUM 8.2 (L) 03/06/2022 0413   GFRNONAA 10 (L) 03/06/2022 0413   GFRAA 35 (L) 03/10/2020 1332   CBC    Component Value Date/Time   WBC 9.1 03/06/2022 0413   RBC 3.26 (L) 03/06/2022 0413   HGB 8.7 (L) 03/06/2022 0413   HGB 9.0 (L)  11/01/2021 1439   HCT 27.7 (L) 03/06/2022 0413   HCT 28.1 (L) 11/01/2021 1439   PLT 192 03/06/2022 0413   PLT 254 11/01/2021 1439   MCV 85.0 03/06/2022 0413   MCV 88 11/01/2021 1439   MCH 26.7 03/06/2022 0413   MCHC 31.4 03/06/2022 0413   RDW 17.6 (H) 03/06/2022 0413   RDW 15.9 (H) 11/01/2021 1439   LYMPHSABS 1.0 03/04/2022 0336   LYMPHSABS 0.9 10/07/2021 1202   MONOABS 1.0 03/04/2022 0336   EOSABS 0.1 03/04/2022 0336   EOSABS 0.2 10/07/2021 1202   BASOSABS 0.0 03/04/2022 0336   BASOSABS 0.1 10/07/2021 1202   Outpatient Dialysis Orders: MWF [facility] 4h  300/600  80kg  3k/2.5Ca bath  Heparin none  RIJ TDC (maturing LUA AVF created on 12/21/21) - last hep B labs: none - mircera 30 ug q2  - iron sucrose '100mg'$  q hd IV thru 8/21 (finished) - calcitriol 0.25 ug tiw po - last HD 9/18, post wt 82.7 kg (signed off 2 hours early on 9/18 and 1:45 early on 9/15)   Assessment/Plan:  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  Large right pleural effusion, s/p thoracentesis 03/01/22 with improved WOB.  Plan for HD and UF as tolerated.   PSVT - cardiology following and to change coreg to lopressor for better rate control.  Started on IV amiodarone last night due to recurrence after HD.  Will change HD orders to 4K bath and decrease goal UF as his volume has improved.   ESRD -  had short session of HD 9/19 with 1.8L UF but followed by SVT and hypotension and give IV bolus of 500 mL.  Plan to continue with HD on  MWF schedule.  Will use 4K and follow.   Hypertension/volume  - as above, serial HD with UF as tolerated.  Anemia  - continue to follow H/H, redose ESA as needed.  Metabolic bone disease -   continue with home meds  Nutrition - renal diet, carb modified  Large right pleural effusion - s/p thoracentesis of 1.3 liters, clear dark yellow fluid removed 03/01/22.    Donetta Potts, MD [facility]

## 2022-03-07 NOTE — H&P (View-Only) (Signed)
$'@LOGO'I$ @   Referring Provider: Dr. Carles Collet Primary Care Physician:  [name], MD Primary Gastroenterologist:  Dr. [name] [facility])  Date of Admission: 02/28/22 Date of Consultation: 03/07/22  Reason for Consultation:  Dysphagia   HPI:  [name] is a 66 y.o. year old male with history of DM, cardiomyopathy with systolic and diastolic CHF, CABG, ESRD on dialysis, stroke, HTN, diabetes, Barrett's esophagus, esophageal adenocarcinoma, who presented to the emergency room on 9/18 from dialysis center with reports of worsening difficulty breathing requiring supplemental O2 and BiPAP.  He was admitted with acute respiratory failure with hypoxia, acute on chronic combined systolic and diastolic CHF.  Ultimately found to have large right pleural effusion requiring thoracentesis with improvement in respiratory status.  He has also been found to have sustained SVT during this admission requiring amiodarone drip, but now on p.o. amiodarone as well as gram-negative bacteremia requiring antibiotics, but repeat blood cultures have been negative thus far (day 3).  Patient also complaining of dysphagia.  Speech therapy evaluation 9/22.  Patient reported significant discomfort with swallowing that started about 2 weeks ago, regurgitation of p.o.  Patient consumed minimal trials of thin liquids and pure reporting globus sensation and discomfort.  Recommended considering barium pill esophagram, dysphagia 1 diet with thin liquids, consider GI evaluation. BPE not completed. GI now consulted for further evaluation.   Regarding patient's history of esophageal adenocarcinoma, he was diagnosed in 2022.  He was considered a poor candidate for esophagectomy.  He is now status post EMR, cryotherapy, and most recently RFA in April 2023.  Most recent EGD 12/20/2021 with esophageal mucosal changes secondary to established Barrett's disease treated with argon plasma coagulation and biopsied, medium sized hiatal hernia.   Esophageal biopsy unremarkable.  Gastric biopsy with cardio oxyntic mucosa with chronic gastritis, foveolar hyperplasia, PPI effect, no H. pylori, metaplasia, or dysplasia.  He is scheduled for repeat EGD 04/07/2022. He is following closely with oncology for monitoring.  Most recent PET scan 02/10/2022 with no signs to suggest residual or recurrent FDG avid tumor within the esophagus, no FDG avid adenopathy or signs of solid organ metastasis.   Today:  Having emesis when I walked in this morning after having puree diet. Patient report 3 weeks to 1 month history of solid food dysphagia with sensation of items getting hung in mid esophagus with associated pain. Intermittent regurgitation. Since being on puree diet, reports these items go down fine, but feels like they stop once they get to his stomach if he is slouching/not sitting up straight, or eating too quickly. In this setting, he starts coughing and ends up regurgitation what he just ate. Denies any reflux symptoms or chronic issues with nausea, vomiting, or abdominal pain. Also states he actually was never having dysphagia issues and esophageal adenocarcinoma was found incidentally at the time of a routine EGD. Denies sensation of items going down the wrong way unless he gets into a hurry.     PPI: Not sure if he has been taking BID.  NSAIDs: Not sure aside from aspirin 325 mg.  GI bleeding: None that he is aware of.  Bowels are moving well.   Thinks he has been losing weight. Unable to quantify. Per chart review, looks like baseline weight was around 202 lbs which is what he weighed in April 2023. Weight declined to 173 lbs in June 2023, but back up to 185 lbs in August. He weighed 183 when he came in to the hospital.   Planning  to have dialysis today.     Spoke with [name], patient's care taker. Reports he has been at home for the last 2 weeks. He has been having postprandial vomiting intermittently since having treatments for  esophageal cancer. Can be right after eating or a little while later. Thinks dialysis causes this to be worse. Takes PPI BID. Reports EGD was postponed until December.     Past Medical History:  Diagnosis Date   Anemia    Arthritis    hips shoulders   CAD (coronary artery disease) 03/03/2019   Cancer ([place])    Esophageal cancer 2022   CHF (congestive heart failure) ([place]) 2020   Chronic combined systolic and diastolic heart failure ([place]) 04/26/2018   Admitted 11/14-11/20/19-diuresed 10L   Depression 05/31/2021   Fatigue 10/14/2020   Hypertension    Myocardial infarction [facility])    Sleep apnea    uses a bipap machine   Stroke ([place]) 12/14/2020   no last weakness or paralysis   Type 2 diabetes mellitus with diabetic nephropathy ([place]) 04/23/2019   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    Past Surgical History:  Procedure Laterality Date   AV FISTULA PLACEMENT Left 12/21/2021   Procedure: LEFT ARM ARTERIOVENOUS (AV) FISTULA CREATION;  Surgeon: Rosetta Posner, MD;  Location: [facility];  Service: Vascular;  Laterality: Left;   BIOPSY  06/27/2019   Procedure: BIOPSY;  Surgeon: Rogene Houston, MD;  [facility];  Service: Endoscopy;;  ascending colon polyp   BIOPSY  10/21/2020   Procedure: BIOPSY;  Surgeon: Rogene Houston, MD;  [facility];  Service: Endoscopy;;  esophageal,gastric polyp   BIOPSY  02/24/2021   Procedure: BIOPSY;  Surgeon: Rogene Houston, MD;  [facility];  Service: Endoscopy;;  distal and proximal esophageal biopsies    CARDIAC SURGERY     CATARACT EXTRACTION     COLONOSCOPY N/A 06/27/2019   [name]:Diverticulosis in the entire examined colon. tubular adenoma in ascending colon, 10m tubular adenoma in prox sigmoid. external hemorrhoids   CORONARY ARTERY BYPASS GRAFT N/A 03/25/2019   Procedure: CORONARY ARTERY BYPASS GRAFTING (CABG) X  4 USING LEFT INTERNAL MAMMARY ARTERY AND RIGHT SAPHENOUS VEIN GRAFTS;  Surgeon: LLajuana Matte MD;   [place]  Service: Open Heart Surgery;  Laterality: N/A;   ESOPHAGOGASTRODUODENOSCOPY (EGD) WITH PROPOFOL N/A 10/21/2020   rehman:Normal hypopharynx.Normal proximal esophagus and mid esophagus.Esophageal mucosal changes consistent with long-segment Barrett's esophagus. (Focal low-grade dysplasia and atypia proximally) z line irregular 37 cm from incisors, 3 cm HH, single gastric polyp (fundic gland) normal duodenal bulb/second portion of duodenum, proximal margin of Barrett's at 34 cm   ESOPHAGOGASTRODUODENOSCOPY (EGD) WITH PROPOFOL N/A 02/24/2021   Procedure: ESOPHAGOGASTRODUODENOSCOPY (EGD) WITH PROPOFOL;  Surgeon: RRogene Houston MD;  [facility];  Service: Endoscopy;  Laterality: N/A;  1:35   ESOPHAGOGASTRODUODENOSCOPY (EGD) WITH PROPOFOL N/A 03/21/2021   Procedure: ESOPHAGOGASTRODUODENOSCOPY (EGD) WITH PROPOFOL;  Surgeon: HCarol Ada MD;  [place]  Service: Endoscopy;  Laterality: N/A;   ESOPHAGOGASTRODUODENOSCOPY (EGD) WITH PROPOFOL N/A 10/23/2021   Procedure: ESOPHAGOGASTRODUODENOSCOPY (EGD) WITH PROPOFOL;  Surgeon: DDoran Stabler MD;  [place]  Service: Gastroenterology;  Laterality: N/A;   EXCISION MORTON'S NEUROMA     EYE SURGERY Left    retina   INCISION AND DRAINAGE OF WOUND Right 06/28/2021   Procedure: IRRIGATION AND DEBRIDEMENT WOUND;  Surgeon: PCindra Presume MD;  [place]  Service: Plastics;  Laterality: Right;  1.5 hour   INSERTION OF DIALYSIS CATHETER Right 12/21/2021   Procedure: INSERTION OF TUNNELED DIALYSIS CATHETER;  Surgeon: Rosetta Posner, MD;  [facility]: [facility];  Service: Vascular;  Laterality: Right;   LEFT HEART CATH AND CORS/GRAFTS ANGIOGRAPHY N/A 02/01/2022   Procedure: LEFT HEART CATH AND CORS/GRAFTS ANGIOGRAPHY;  Surgeon: Martinique, Peter M, MD;  Location: [place] [facility];  Service: Cardiovascular;  Laterality: N/A;   POLYPECTOMY  06/27/2019   Procedure: POLYPECTOMY;  Surgeon: Rogene Houston, MD;   [facility];  Service: Endoscopy;;  proximal sigmoid colon   RIGHT/LEFT HEART CATH AND CORONARY ANGIOGRAPHY N/A 03/12/2019   Procedure: RIGHT/LEFT HEART CATH AND CORONARY ANGIOGRAPHY;  Surgeon: [name], MD;  Location: [place] [facility];  Service: Cardiovascular;  Laterality: N/A;   SHOULDER ARTHROSCOPY WITH ROTATOR CUFF REPAIR AND SUBACROMIAL DECOMPRESSION Right 08/26/2020   Procedure: RIGHT SHOULDER MINI OPEN ROTATOR CUFF REPAIR AND SUBACROMIAL DECOMPRESSION WITH PATCH GRAFT;  Surgeon: Susa Day, MD;  [facility]: [facility];  Service: Orthopedics;  Laterality: Right;  90 MINS GENERAL WITH BLOCK   SKIN SPLIT GRAFT Right 06/28/2021   Procedure: SKIN GRAFT SPLIT THICKNESS;  Surgeon: [name], MD;  Location: [place];  Service: Plastics;  Laterality: Right;   TEE WITHOUT CARDIOVERSION N/A 03/25/2019   Procedure: TRANSESOPHAGEAL ECHOCARDIOGRAM (TEE);  Surgeon: [name], MD;  Location: [place];  Service: Open Heart Surgery;  Laterality: N/A;    Prior to Admission medications   Medication Sig Start Date End Date Taking? Authorizing Provider  albuterol (VENTOLIN HFA) 108 (90 Base) MCG/ACT inhaler Inhale 2 puffs into the lungs every 6 (six) hours as needed. Patient taking differently: Inhale 2 puffs into the lungs every 6 (six) hours as needed for wheezing or shortness of breath. 06/24/21  Yes Cook, Jayce G, DO  ARIPiprazole (ABILIFY) 5 MG tablet Take 5 mg by mouth daily.   Yes [provider]  ascorbic acid (VITAMIN C) 500 MG tablet Take 1 tablet (500 mg total) by mouth daily. 08/03/21  Yes Allie Bossier, MD  aspirin EC 325 MG tablet Take 325 mg by mouth daily. 02/21/22  Yes [provider]  calcium acetate (PHOSLO) 667 MG capsule Take 667 mg by mouth in the morning, at noon, and at bedtime.   Yes [provider]  calcium-vitamin D (OSCAL WITH D) 500-5 MG-MCG tablet Take 1 tablet by mouth 2 (two) times daily.   Yes [provider]   carvedilol (COREG) 12.5 MG tablet Take 12.5 mg by mouth 2 (two) times daily. 02/21/22  Yes [provider]  doxazosin (CARDURA) 4 MG tablet Take 1 tablet (4 mg total) by mouth daily. 09/14/21  Yes Loel Dubonnet, NP  EPINEPHrine 0.3 mg/0.3 mL IJ SOAJ injection Inject 0.3 mg into the muscle as needed for anaphylaxis. 01/25/21  Yes [name], MD  hydrALAZINE (APRESOLINE) 100 MG tablet Take 1 tablet (100 mg total) by mouth 3 (three) times daily. 11/01/21  Yes Loel Dubonnet, NP  hydrOXYzine (VISTARIL) 25 MG capsule TAKE ONE CAPSULE BY MOUTH NIGHTLY AS NEEDED FOR ALLERGIES AND INSOMNIA Patient taking differently: Take 25 mg by mouth at bedtime as needed (allerigies and insomnia). 11/01/21  Yes [name], MD  isosorbide dinitrate (ISORDIL) 30 MG tablet Take 30 mg by mouth 3 (three) times daily. 02/21/22  Yes [provider]  lidocaine (ASPERCREME LIDOCAINE) 4 % Place 1 patch onto the skin daily. Wear for 12 hours  then remove, wait 12 hours before applying a new patch to lower back   Yes [provider]  Multiple Vitamins-Minerals (THERATRUM COMPLETE) TABS Take 1 tablet by mouth daily.   Yes [provider]  nitroGLYCERIN (NITROSTAT) 0.4 MG SL tablet Place 1 tablet (0.4 mg total) under the tongue every 5 (five) minutes as needed for chest pain. 09/14/21  Yes Loel Dubonnet, NP  Nutritional Supplements (FEEDING SUPPLEMENT, NEPRO CARB STEADY,) LIQD Take 237 mLs by mouth 2 (two) times daily with a meal.   Yes [provider]  omeprazole (PRILOSEC) 40 MG capsule Take 40 mg by mouth in the morning and at bedtime.   Yes [provider]  ondansetron (ZOFRAN) 4 MG tablet Take 4 mg by mouth every 8 (eight) hours as needed for nausea or vomiting.   Yes [provider]  ondansetron (ZOFRAN-ODT) 4 MG disintegrating tablet Take 4 mg by mouth every 8 (eight) hours as needed. 02/20/22  Yes [provider]  REPATHA SURECLICK 782 MG/ML SOAJ  Inject 140 mg as directed every 14 (fourteen) days. Patient taking differently: Inject 140 mg as directed every 14 (fourteen) days. Starting 02/08/22 09/14/21  Yes Loel Dubonnet, NP  sertraline (ZOLOFT) 100 MG tablet 1 daily 02/11/22  Yes [name], [name], MD  torsemide (DEMADEX) 20 MG tablet Take 40 mg by mouth daily. 02/21/22  Yes [provider]  traZODone (DESYREL) 50 MG tablet Take 0.5-1 tablets (25-50 mg total) by mouth at bedtime as needed for sleep. 02/11/22  Yes [name], MD  acetaminophen (TYLENOL) 325 MG tablet Take 2 tablets (650 mg total) by mouth every 6 (six) hours as needed for mild pain, moderate pain or fever. 08/02/21   Allie Bossier, MD  cholecalciferol (VITAMIN D) 25 MCG (1000 UNIT) tablet Take 1,000 Units by mouth daily. Patient not taking: Reported on 02/28/2022    [provider]  insulin aspart (NOVOLOG FLEXPEN) 100 UNIT/ML FlexPen Inject into the skin. Patient not taking: Reported on 02/28/2022 02/12/22   [provider]  insulin aspart (NOVOLOG) 100 UNIT/ML injection Inject 0-15 Units into the skin 3 (three) times daily before meals. Per sliding scale Patient not taking: Reported on 02/28/2022    [provider]  Insulin Pen Needle (B-D ULTRAFINE III SHORT PEN) 31G X 8 MM MISC 1 each by Does not apply route as directed. 05/30/19   Cassandria Anger, MD  isosorbide mononitrate (IMDUR) 30 MG 24 hr tablet Take 1 tablet (30 mg total) by mouth daily. Patient not taking: Reported on 02/28/2022 02/04/22   Niel Hummer A, MD  metoprolol tartrate (LOPRESSOR) 50 MG tablet Take 1 tablet (50 mg total) by mouth 2 (two) times daily. Patient not taking: Reported on 02/28/2022 02/03/22   Regalado, Jerald Kief A, MD  oxyCODONE-acetaminophen (PERCOCET/ROXICET) 5-325 MG tablet Take 1 tablet by mouth every 6 (six) hours as needed. Patient not taking: Reported on 02/28/2022 02/21/22   [provider]    Current Facility-Administered Medications   Medication Dose Route Frequency Provider Last Rate Last Admin   amiodarone (PACERONE) tablet 400 mg  400 mg Oral BID Tat, Shanon Brow, MD   400 mg at 03/06/22 2124   aspirin tablet 325 mg  325 mg Oral Daily Tat, David, MD   325 mg at 03/06/22 1238   calcium acetate (PHOSLO) capsule 667 mg  667 mg Oral TID WC Emokpae, Ejiroghene E, MD   667 mg at 03/07/22 0747   Chlorhexidine Gluconate Cloth 2 %  PADS 6 each  6 each Topical Q0600 Zierle-Ghosh, Asia B, DO   6 each at 03/06/22 0820   Chlorhexidine Gluconate Cloth 2 % PADS 6 each  6 each Topical Q0600 Corliss Parish, MD   6 each at 03/06/22 1238   Darbepoetin Alfa (ARANESP) injection 60 mcg  60 mcg Intravenous Q Barbie Haggis, MD   60 mcg at 03/04/22 1814   doxazosin (CARDURA) tablet 4 mg  4 mg Oral Daily Emokpae, Ejiroghene E, MD   4 mg at 03/06/22 0818   feeding supplement (NEPRO CARB STEADY) liquid 237 mL  237 mL Oral BID BM Johnson, Clanford L, MD   237 mL at 03/03/22 1428   heparin injection 5,000 Units  5,000 Units Subcutaneous Q8H Emokpae, Ejiroghene E, MD   5,000 Units at 03/07/22 5638   hydrALAZINE (APRESOLINE) injection 10 mg  10 mg Intravenous Q6H PRN Adefeso, Oladapo, DO   10 mg at 03/07/22 7564   hydrALAZINE (APRESOLINE) tablet 25 mg  25 mg Oral Q8H Satira Sark, MD       insulin aspart (novoLOG) injection 0-5 Units  0-5 Units Subcutaneous QHS Emokpae, Ejiroghene E, MD   3 Units at 03/06/22 2125   insulin aspart (novoLOG) injection 0-9 Units  0-9 Units Subcutaneous TID WC Emokpae, Ejiroghene E, MD   2 Units at 03/07/22 0748   isosorbide dinitrate (ISORDIL) tablet 30 mg  30 mg Oral TID Emokpae, Ejiroghene E, MD   30 mg at 03/06/22 2125   meropenem (MERREM) 500 mg in sodium chloride 0.9 % 100 mL IVPB  500 mg Intravenous Q24H Orson Eva, MD 200 mL/hr at 03/06/22 2136 500 mg at 03/06/22 2136   [START ON 03/08/2022] metoprolol succinate (TOPROL-XL) 24 hr tablet 100 mg  100 mg Oral Daily Satira Sark, MD       metoprolol  tartrate (LOPRESSOR) tablet 75 mg  75 mg Oral BID Satira Sark, MD   75 mg at 03/06/22 2125   ondansetron (ZOFRAN) tablet 4 mg  4 mg Oral Q6H PRN Emokpae, Ejiroghene E, MD   4 mg at 03/02/22 0719   Or   ondansetron (ZOFRAN) injection 4 mg  4 mg Intravenous Q6H PRN Emokpae, Ejiroghene E, MD   4 mg at 03/06/22 2325   polyethylene glycol (MIRALAX / GLYCOLAX) packet 17 g  17 g Oral Daily PRN Emokpae, Ejiroghene E, MD       prochlorperazine (COMPAZINE) injection 10 mg  10 mg Intravenous Q6H PRN Adefeso, Oladapo, DO   10 mg at 03/03/22 1146   sertraline (ZOLOFT) tablet 100 mg  100 mg Oral Daily Emokpae, Ejiroghene E, MD   100 mg at 03/06/22 0819   traZODone (DESYREL) tablet 25-50 mg  25-50 mg Oral QHS PRN Emokpae, Ejiroghene E, MD   50 mg at 03/03/22 2233    Allergies as of 02/28/2022 - Review Complete 02/28/2022  Allergen Reaction Noted   Bee venom Anaphylaxis 07/29/2015   Atorvastatin Other (See Comments) 02/20/2020   Diltiazem Itching 02/05/2021   Reglan [metoclopramide] Other (See Comments) 09/16/2021   Rosuvastatin Other (See Comments) 05/05/2020   Valsartan Itching 02/18/2021    Family History  Problem Relation Age of Onset   Colon cancer Mother    Heart Problems Father    Diabetes Father    Valvular heart disease Father    Sleep apnea Neg Hx    Stroke Neg Hx     Social History   Socioeconomic History   Marital status: Single  Spouse name: Not on file   Number of children: Not on file   Years of education: Not on file   Highest education level: Not on file  Occupational History   Not on file  Tobacco Use   Smoking status: Former    Types: Cigarettes    Quit date: 07/06/1988    Years since quitting: 33.6   Smokeless tobacco: Never  Vaping Use   Vaping Use: Never used  Substance and Sexual Activity   Alcohol use: Not Currently    Alcohol/week: 2.0 standard drinks of alcohol    Types: 2 Glasses of wine per week    Comment: socially   Drug use: No   Sexual  activity: Not on file  Other Topics Concern   Not on file  Social History Narrative   Not on file   Social Determinants of Health   Financial Resource Strain: Medium Risk (08/16/2021)   Overall Financial Resource Strain (CARDIA)    Difficulty of Paying Living Expenses: Somewhat hard  Food Insecurity: No Food Insecurity (02/28/2022)   Hunger Vital Sign    Worried About Running Out of Food in the Last Year: Never true    Ran Out of Food in the Last Year: Never true  Transportation Needs: No Transportation Needs (02/28/2022)   PRAPARE - Hydrologist (Medical): No    Lack of Transportation (Non-Medical): No  Physical Activity: Not on file  Stress: Not on file  Social Connections: Not on file  Intimate Partner Violence: Not At Risk (02/28/2022)   Humiliation, Afraid, Rape, and Kick questionnaire    Fear of Current or Ex-Partner: No    Emotionally Abused: No    Physically Abused: No    Sexually Abused: No    Review of Systems: Gen: Denies fever, chills, cold or flu like symptoms, pre-syncope, or syncope. CV: Denies chest pain, heart palpitations. Resp: Denies shortness of breath. Reports his breathing is much improved since admission.  GI: See HPI GU : Denies urinary burning, urinary frequency, urinary incontinence.  MS: Denies joint pain. Derm: Denies rash. Psych: Denies depression, anxiety. Heme: See HPI  Physical Exam: Vital signs in last 24 hours: Temp:  [98.1 F (36.7 C)-98.7 F (37.1 C)] 98.1 F (36.7 C) (09/25 0712) Pulse Rate:  [66-73] 72 (09/25 0712) Resp:  [14-28] 18 (09/25 0712) BP: (118-199)/(45-65) 172/61 (09/25 0700) SpO2:  [89 %-98 %] 96 % (09/25 0712) Weight:  [82.5 kg] 82.5 kg (09/25 0500) Last BM Date : 03/06/22 General:   Alert,  Vomiting when I walked in the room. Overall, well-developed, well-nourished.  Head:  Normocephalic and atraumatic. Eyes:  Sclera clear, no icterus.   Conjunctiva pink. Ears:  Normal auditory  acuity. Nose:  No deformity, discharge,  or lesions. Lungs:  Clear throughout to auscultation. Some decreased breath sounds in right base.  No acute distress. Heart:  Regular rate and rhythm; no murmurs, clicks, rubs,  or gallops. Abdomen:  Soft, nontender and nondistended. No masses, hepatosplenomegaly or hernias noted. Normal bowel sounds, without guarding, and without rebound.   Rectal:  Deferred   Msk:  Symmetrical without gross deformities. Normal posture. Extremities:  Without edema. Neurologic:  Alert and  oriented x4;  grossly normal neurologically. Skin:  Intact without significant lesions or rashes. Psych:  Normal mood and affect.  Intake/Output from previous day: 09/24 0701 - 09/25 0700 In: 206.9 [I.V.:206.9] Out: 200 [Urine:200] Intake/Output this shift: No intake/output data recorded.  Lab Results: Recent Labs  03/06/22 0413  WBC 9.1  HGB 8.7*  HCT 27.7*  PLT 192   BMET Recent Labs    03/06/22 0413  NA 134*  K 3.6  CL 98  CO2 25  GLUCOSE 226*  BUN 46*  CREATININE 5.66*  CALCIUM 8.2*    Impression: 66 y.o. year old male with history of DM, cardiomyopathy with systolic and diastolic CHF, CABG, ESRD on dialysis, stroke, HTN, diabetes, Barrett's esophagus, esophageal adenocarcinoma, who presented to the emergency room on 9/18 from dialysis center with reports of worsening difficulty breathing requiring supplemental O2 and BiPAP.  He was admitted with acute respiratory failure with hypoxia, acute on chronic combined systolic and diastolic CHF.  Ultimately found to have large right pleural effusion requiring thoracentesis with improvement in respiratory status.  He has also been found to have sustained SVT during this admission requiring amiodarone drip, but now on p.o. amiodarone as well as gram-negative bacteremia requiring antibiotics, but repeat blood cultures have been negative thus far (day 3).  Patient also complaining of dysphagia.  Speech therapy  evaluation 9/22.  Patient reported significant discomfort with swallowing, regurgitation of p.o..  Bedside swallow evaluation completed with patient consuming minimal trials of thin liquids and pure reporting globus sensation and discomfort, no overt aspiration.  Recommended considering GI evaluation, barium pill esophagram, dysphagia 1 diet with thin liquids.  Dysphagia:  New onset solid food dysphagia x1 month with associated postprandial regurgitation.  He does have history of esophageal adenocarcinoma diagnosed in 2022 now s/p EMR, cryotherapy, RFA in April 2023 with most recent EGD in July with esophageal mucosal changes secondary to established Barrett's disease treated with argon plasma coagulation and biopsied, medium sized hiatal hernia.  Esophageal biopsy unremarkable.  Gastric biopsy with cardio oxyntic mucosa with chronic gastritis, foveolar hyperplasia, PPI effect, no H. pylori, metaplasia, or dysplasia. PET scan  02/10/2022 with no signs of residual, recurrence, or metastasis. Notably, patient reports no dysphagia even at the time of esophageal adenocarcinoma diagnosis. Since being on puree diet, he reports items seem to go down his esophagus fine as long as he is sitting up right, eating slow, and taking small bites, otherwise, he ends up with postprandial vomiting. Reports feeling the puree items sitting in his upper abdomen in this case. It is also notable that he lost about 30 lbs between April and June, but has gained about 10 lbs back since that time with weight stable over the last month.  Chronic GERD has been well controlled on PPI BID prior to admission, though not receiving at this time.   Etiology of dysphagia isn't clear, but could be secondary to esophageal web, ring or stricture. I feel that malignancy is less likely with recent EGD 2 months ago, but unable to rule this out. Could also have underlying motility disorder. No overt aspiration with bedside swallow study though can't  rule this out as he was coughing quite a bit prior to emesis this morning.   At this point, would recommend EGD for further evaluation. Consider BPE and MBSS if EGD is unrevealing or if postprandial coughing continues.   Plan: Proceed with upper endoscopy +/- dilation with propofol by Dr. Abbey Chatters tomorrow. The risks, benefits, and alternatives have been discussed with the patient in detail. The patient states understanding and desires to proceed.   Heparin will be held after last dose tonight.  Continue Puree diet and thin liquids as tolerated for now.  NPO at midnight.  Needs to sit up right 90 degrees while  eating and remain upright for at least 60-90 minutes before reclining. Shouldn't lay flat for at least 3 hours after eating.  May benefit from BPE/MBSS if EGD is unrevealing or if postprandial coughing continues. Resume PPI BID.      LOS: 7 days    03/07/2022, 9:12 AM   Aliene Altes, [facility] G[facility]

## 2022-03-07 NOTE — Progress Notes (Signed)
Progress Note  Patient Name: [name] Date of Encounter: 03/07/2022  Primary Cardiologist: Skeet Latch, MD  Subjective   Intermittent cough and some emesis.  No abdominal pain.  No chest pain.  Inpatient Medications    Scheduled Meds:  amiodarone  400 mg Oral BID   aspirin  325 mg Oral Daily   calcium acetate  667 mg Oral TID WC   Chlorhexidine Gluconate Cloth  6 each Topical Q0600   Chlorhexidine Gluconate Cloth  6 each Topical Q0600   darbepoetin (ARANESP) injection - DIALYSIS  60 mcg Intravenous Q Fri-HD   doxazosin  4 mg Oral Daily   feeding supplement (NEPRO CARB STEADY)  237 mL Oral BID BM   heparin  5,000 Units Subcutaneous Q8H   insulin aspart  0-5 Units Subcutaneous QHS   insulin aspart  0-9 Units Subcutaneous TID WC   isosorbide dinitrate  30 mg Oral TID   metoprolol tartrate  75 mg Oral BID   sertraline  100 mg Oral Daily   Continuous Infusions:  meropenem (MERREM) IV 500 mg (03/06/22 2136)   PRN Meds: hydrALAZINE, ondansetron **OR** ondansetron (ZOFRAN) IV, polyethylene glycol, prochlorperazine, traZODone   Vital Signs    Vitals:   03/07/22 0400 03/07/22 0500 03/07/22 0700 03/07/22 0712  BP: (!) 196/65  (!) 172/61   Pulse:   73 72  Resp: 14  (!) 23 18  Temp:  98.3 F (36.8 C)  98.1 F (36.7 C)  TempSrc:  Oral  Oral  SpO2:   95% 96%  Weight:  82.5 kg    Height:        Intake/Output Summary (Last 24 hours) at 03/07/2022 0824 Last data filed at 03/06/2022 2000 Gross per 24 hour  Intake 206.86 ml  Output 200 ml  Net 6.86 ml   Filed Weights   03/04/22 1832 03/06/22 0631 03/07/22 0500  Weight: 81 kg 80.2 kg 82.5 kg    Telemetry    Sinus rhythm.  Personally reviewed.  ECG    An ECG dated 03/01/2022 was personally reviewed today and demonstrated:  Sinus rhythm with PAC, diffuse ST-T wave abnormalities.  Physical Exam   GEN: Chronically ill-appearing, no acute distress.   Neck: No JVD. Cardiac: RRR, no gallop.  Respiratory:  Nonlabored.  Decreased breath sounds right base. GI: Soft, nontender, bowel sounds present. MS: No edema; No deformity. Neuro:  Nonfocal. Psych: Alert and oriented x 3. Normal affect.  Labs    Chemistry Recent Labs  Lab 02/28/22 1238 03/01/22 0040 03/02/22 0238 03/03/22 0612 03/03/22 1415 03/04/22 0336 03/06/22 0413  NA 138   < > 135 135  --  136 134*  K 4.4   < > 3.7 3.5  --  3.9 3.6  CL 99   < > 99 100  --  100 98  CO2 20*   < > 23 24  --  23 25  GLUCOSE 176*   < > 194* 227*  --  151* 226*  BUN 48*   < > 51* 43*  --  61* 46*  CREATININE 4.47*   < > 4.76* 4.41*  --  5.60* 5.66*  CALCIUM 8.7*   < > 8.1* 8.2*  --  8.2* 8.2*  PROT 7.6  --   --   --  5.9*  --   --   ALBUMIN 4.0  --  3.3* 3.3* 3.1*  --   --   AST 33  --   --   --  15  --   --   ALT 32  --   --   --  21  --   --   ALKPHOS 135*  --   --   --  93  --   --   BILITOT 1.0  --   --   --  0.7  --   --   GFRNONAA 14*   < > 13* 14*  --  11* 10*  ANIONGAP 19*   < > 13 11  --  13 11   < > = values in this interval not displayed.     Hematology Recent Labs  Lab 03/03/22 0612 03/03/22 1415 03/04/22 0336 03/06/22 0413  WBC 5.7  --  7.5 9.1  RBC 3.46*  --  3.16* 3.26*  HGB 9.4* 8.8* 8.5* 8.7*  HCT 29.4* 28.1* 26.8* 27.7*  MCV 85.0  --  84.8 85.0  MCH 27.2  --  26.9 26.7  MCHC 32.0  --  31.7 31.4  RDW 17.6*  --  17.5* 17.6*  PLT 154  --  152 192    Cardiac Enzymes Recent Labs  Lab 02/28/22 1238 02/28/22 1532 03/01/22 0040  TROPONINIHS 51* 52* 53*    BNP Recent Labs  Lab 02/28/22 1249  BNP >4,500.0*     Radiology    No results found.  Cardiac Studies   Echocardiogram 03/01/2022:  1. Left ventricular ejection fraction, by estimation, is 40 to 45%. The  left ventricle has mildly decreased function. The left ventricle  demonstrates global hypokinesis. There is moderate left ventricular  hypertrophy. Left ventricular diastolic  parameters are indeterminate.   2. Ventricular septum is flattened  in systole consistent with RV pressure  overload. . Right ventricular systolic function is moderately reduced. The  right ventricular size is severely enlarged. There is mildly elevated  pulmonary artery systolic  pressure.   3. Left atrial size was severely dilated.   4. Catheter tip noted in RA. Right atrial size was severely dilated.   5. The mitral valve is normal in structure. No evidence of mitral valve  regurgitation. No evidence of mitral stenosis.   6. The tricuspid valve is abnormal.   7. The aortic valve is tricuspid. There is mild calcification of the  aortic valve. There is mild thickening of the aortic valve. Aortic valve  regurgitation is not visualized. No aortic stenosis is present.   8. The inferior vena cava is dilated in size with >50% respiratory  variability, suggesting right atrial pressure of 8 mmHg.   Assessment & Plan    1.  HFrEF with ischemic cardiomyopathy, LVEF 40 to 45% by recent assessment, also moderate RV dysfunction with at least mildly elevated estimated PASP.  Right pleural effusion at presentation now status post thoracentesis.  GDMT limited by comorbidities.  Currently on Lopressor and isosorbide dinitrate.  Volume status managed by HD.  2.  PSVT and NSVT.  Beta-blocker switched from Coreg to metoprolol and ultimately started on amiodarone for rhythm suppression per chart review.  3.  Multivessel CAD status post CABG.  Cardiac catheterization in August 2023 showed patent bypass grafts with plan for medical therapy.  4.  ESRD on hemodialysis.  5.  Esophageal adenocarcinoma, poor candidate for esophagectomy per chart review.  Status post EMR, cryotherapy, and RFA in April.  He follows at [facility].  Having recent issues with dysphagia, GI consultation in process and considering follow-up endoscopy.  6.  OSA with use of BiPAP.  7.  Metabolic encephalopathy.  Chart reviewed including follow-up with Dr. Harrington Challenger from last week.  Recommend resuming  hydralazine at lower dose, continue isosorbide dinitrate and transition from Lopressor to Toprol-XL.  Can continue amiodarone 400 mg twice daily for now.  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  Anticipate hemodialysis session today.  We will continue to follow.  Signed, Rozann Lesches, MD  03/07/2022, 8:24 AM

## 2022-03-07 NOTE — Consult Note (Cosign Needed Addendum)
$'@LOGO'O$ @   Referring Provider: Dr. Carles Collet Primary Care Physician:  [name], MD Primary Gastroenterologist:  Dr. [name] [facility])  Date of Admission: 02/28/22 Date of Consultation: 03/07/22  Reason for Consultation:  Dysphagia   HPI:  [name] is a 66 y.o. year old male with history of DM, cardiomyopathy with systolic and diastolic CHF, CABG, ESRD on dialysis, stroke, HTN, diabetes, Barrett's esophagus, esophageal adenocarcinoma, who presented to the emergency room on 9/18 from dialysis center with reports of worsening difficulty breathing requiring supplemental O2 and BiPAP.  He was admitted with acute respiratory failure with hypoxia, acute on chronic combined systolic and diastolic CHF.  Ultimately found to have large right pleural effusion requiring thoracentesis with improvement in respiratory status.  He has also been found to have sustained SVT during this admission requiring amiodarone drip, but now on p.o. amiodarone as well as gram-negative bacteremia requiring antibiotics, but repeat blood cultures have been negative thus far (day 3).  Patient also complaining of dysphagia.  Speech therapy evaluation 9/22.  Patient reported significant discomfort with swallowing that started about 2 weeks ago, regurgitation of p.o.  Patient consumed minimal trials of thin liquids and pure reporting globus sensation and discomfort.  Recommended considering barium pill esophagram, dysphagia 1 diet with thin liquids, consider GI evaluation. BPE not completed. GI now consulted for further evaluation.   Regarding patient's history of esophageal adenocarcinoma, he was diagnosed in 2022.  He was considered a poor candidate for esophagectomy.  He is now status post EMR, cryotherapy, and most recently RFA in April 2023.  Most recent EGD 12/20/2021 with esophageal mucosal changes secondary to established Barrett's disease treated with argon plasma coagulation and biopsied, medium sized hiatal hernia.   Esophageal biopsy unremarkable.  Gastric biopsy with cardio oxyntic mucosa with chronic gastritis, foveolar hyperplasia, PPI effect, no H. pylori, metaplasia, or dysplasia.  He is scheduled for repeat EGD 04/07/2022. He is following closely with oncology for monitoring.  Most recent PET scan 02/10/2022 with no signs to suggest residual or recurrent FDG avid tumor within the esophagus, no FDG avid adenopathy or signs of solid organ metastasis.   Today:  Having emesis when I walked in this morning after having puree diet. Patient report 3 weeks to 1 month history of solid food dysphagia with sensation of items getting hung in mid esophagus with associated pain. Intermittent regurgitation. Since being on puree diet, reports these items go down fine, but feels like they stop once they get to his stomach if he is slouching/not sitting up straight, or eating too quickly. In this setting, he starts coughing and ends up regurgitation what he just ate. Denies any reflux symptoms or chronic issues with nausea, vomiting, or abdominal pain. Also states he actually was never having dysphagia issues and esophageal adenocarcinoma was found incidentally at the time of a routine EGD. Denies sensation of items going down the wrong way unless he gets into a hurry.     PPI: Not sure if he has been taking BID.  NSAIDs: Not sure aside from aspirin 325 mg.  GI bleeding: None that he is aware of.  Bowels are moving well.   Thinks he has been losing weight. Unable to quantify. Per chart review, looks like baseline weight was around 202 lbs which is what he weighed in April 2023. Weight declined to 173 lbs in June 2023, but back up to 185 lbs in August. He weighed 183 when he came in to the hospital.   Planning  to have dialysis today.     Spoke with [name], patient's care taker. Reports he has been at home for the last 2 weeks. He has been having postprandial vomiting intermittently since having treatments for  esophageal cancer. Can be right after eating or a little while later. Thinks dialysis causes this to be worse. Takes PPI BID. Reports EGD was postponed until December.     Past Medical History:  Diagnosis Date   Anemia    Arthritis    hips shoulders   CAD (coronary artery disease) 03/03/2019   Cancer ([place])    Esophageal cancer 2022   CHF (congestive heart failure) ([place]) 2020   Chronic combined systolic and diastolic heart failure ([place]) 04/26/2018   Admitted 11/14-11/20/19-diuresed 10L   Depression 05/31/2021   Fatigue 10/14/2020   Hypertension    Myocardial infarction [facility])    Sleep apnea    uses a bipap machine   Stroke ([place]) 12/14/2020   no last weakness or paralysis   Type 2 diabetes mellitus with diabetic nephropathy ([place]) 04/23/2019   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    Past Surgical History:  Procedure Laterality Date   AV FISTULA PLACEMENT Left 12/21/2021   Procedure: LEFT ARM ARTERIOVENOUS (AV) FISTULA CREATION;  Surgeon: Rosetta Posner, MD;  Location: [facility];  Service: Vascular;  Laterality: Left;   BIOPSY  06/27/2019   Procedure: BIOPSY;  Surgeon: Rogene Houston, MD;  [facility];  Service: Endoscopy;;  ascending colon polyp   BIOPSY  10/21/2020   Procedure: BIOPSY;  Surgeon: Rogene Houston, MD;  [facility];  Service: Endoscopy;;  esophageal,gastric polyp   BIOPSY  02/24/2021   Procedure: BIOPSY;  Surgeon: Rogene Houston, MD;  [facility];  Service: Endoscopy;;  distal and proximal esophageal biopsies    CARDIAC SURGERY     CATARACT EXTRACTION     COLONOSCOPY N/A 06/27/2019   [name]:Diverticulosis in the entire examined colon. tubular adenoma in ascending colon, 32m tubular adenoma in prox sigmoid. external hemorrhoids   CORONARY ARTERY BYPASS GRAFT N/A 03/25/2019   Procedure: CORONARY ARTERY BYPASS GRAFTING (CABG) X  4 USING LEFT INTERNAL MAMMARY ARTERY AND RIGHT SAPHENOUS VEIN GRAFTS;  Surgeon: LLajuana Matte MD;   [place]  Service: Open Heart Surgery;  Laterality: N/A;   ESOPHAGOGASTRODUODENOSCOPY (EGD) WITH PROPOFOL N/A 10/21/2020   rehman:Normal hypopharynx.Normal proximal esophagus and mid esophagus.Esophageal mucosal changes consistent with long-segment Barrett's esophagus. (Focal low-grade dysplasia and atypia proximally) z line irregular 37 cm from incisors, 3 cm HH, single gastric polyp (fundic gland) normal duodenal bulb/second portion of duodenum, proximal margin of Barrett's at 34 cm   ESOPHAGOGASTRODUODENOSCOPY (EGD) WITH PROPOFOL N/A 02/24/2021   Procedure: ESOPHAGOGASTRODUODENOSCOPY (EGD) WITH PROPOFOL;  Surgeon: RRogene Houston MD;  [facility];  Service: Endoscopy;  Laterality: N/A;  1:35   ESOPHAGOGASTRODUODENOSCOPY (EGD) WITH PROPOFOL N/A 03/21/2021   Procedure: ESOPHAGOGASTRODUODENOSCOPY (EGD) WITH PROPOFOL;  Surgeon: HCarol Ada MD;  [place]  Service: Endoscopy;  Laterality: N/A;   ESOPHAGOGASTRODUODENOSCOPY (EGD) WITH PROPOFOL N/A 10/23/2021   Procedure: ESOPHAGOGASTRODUODENOSCOPY (EGD) WITH PROPOFOL;  Surgeon: DDoran Stabler MD;  [place]  Service: Gastroenterology;  Laterality: N/A;   EXCISION MORTON'S NEUROMA     EYE SURGERY Left    retina   INCISION AND DRAINAGE OF WOUND Right 06/28/2021   Procedure: IRRIGATION AND DEBRIDEMENT WOUND;  Surgeon: PCindra Presume MD;  [place]  Service: Plastics;  Laterality: Right;  1.5 hour   INSERTION OF DIALYSIS CATHETER Right 12/21/2021   Procedure: INSERTION OF TUNNELED DIALYSIS CATHETER;  Surgeon: Rosetta Posner, MD;  [facility];  Service: Vascular;  Laterality: Right;   LEFT HEART CATH AND CORS/GRAFTS ANGIOGRAPHY N/A 02/01/2022   Procedure: LEFT HEART CATH AND CORS/GRAFTS ANGIOGRAPHY;  Surgeon: Martinique, Peter M, MD;  Location: [place] [facility];  Service: Cardiovascular;  Laterality: N/A;   POLYPECTOMY  06/27/2019   Procedure: POLYPECTOMY;  Surgeon: Rogene Houston, MD;   [facility];  Service: Endoscopy;;  proximal sigmoid colon   RIGHT/LEFT HEART CATH AND CORONARY ANGIOGRAPHY N/A 03/12/2019   Procedure: RIGHT/LEFT HEART CATH AND CORONARY ANGIOGRAPHY;  Surgeon: [name], MD;  Location: [place] [facility];  Service: Cardiovascular;  Laterality: N/A;   SHOULDER ARTHROSCOPY WITH ROTATOR CUFF REPAIR AND SUBACROMIAL DECOMPRESSION Right 08/26/2020   Procedure: RIGHT SHOULDER MINI OPEN ROTATOR CUFF REPAIR AND SUBACROMIAL DECOMPRESSION WITH PATCH GRAFT;  Surgeon: Susa Day, MD;  Location: [facility];  Service: Orthopedics;  Laterality: Right;  90 MINS GENERAL WITH BLOCK   SKIN SPLIT GRAFT Right 06/28/2021   Procedure: SKIN GRAFT SPLIT THICKNESS;  Surgeon: [name], MD;  Location: [place];  Service: Plastics;  Laterality: Right;   TEE WITHOUT CARDIOVERSION N/A 03/25/2019   Procedure: TRANSESOPHAGEAL ECHOCARDIOGRAM (TEE);  Surgeon: [name], MD;  Location: [place];  Service: Open Heart Surgery;  Laterality: N/A;    Prior to Admission medications   Medication Sig Start Date End Date Taking? Authorizing Provider  albuterol (VENTOLIN HFA) 108 (90 Base) MCG/ACT inhaler Inhale 2 puffs into the lungs every 6 (six) hours as needed. Patient taking differently: Inhale 2 puffs into the lungs every 6 (six) hours as needed for wheezing or shortness of breath. 06/24/21  Yes Cook, Jayce G, DO  ARIPiprazole (ABILIFY) 5 MG tablet Take 5 mg by mouth daily.   Yes [provider]  ascorbic acid (VITAMIN C) 500 MG tablet Take 1 tablet (500 mg total) by mouth daily. 08/03/21  Yes Allie Bossier, MD  aspirin EC 325 MG tablet Take 325 mg by mouth daily. 02/21/22  Yes [provider]  calcium acetate (PHOSLO) 667 MG capsule Take 667 mg by mouth in the morning, at noon, and at bedtime.   Yes [provider]  calcium-vitamin D (OSCAL WITH D) 500-5 MG-MCG tablet Take 1 tablet by mouth 2 (two) times daily.   Yes [provider]   carvedilol (COREG) 12.5 MG tablet Take 12.5 mg by mouth 2 (two) times daily. 02/21/22  Yes [provider]  doxazosin (CARDURA) 4 MG tablet Take 1 tablet (4 mg total) by mouth daily. 09/14/21  Yes Loel Dubonnet, NP  EPINEPHrine 0.3 mg/0.3 mL IJ SOAJ injection Inject 0.3 mg into the muscle as needed for anaphylaxis. 01/25/21  Yes [name], MD  hydrALAZINE (APRESOLINE) 100 MG tablet Take 1 tablet (100 mg total) by mouth 3 (three) times daily. 11/01/21  Yes Loel Dubonnet, NP  hydrOXYzine (VISTARIL) 25 MG capsule TAKE ONE CAPSULE BY MOUTH NIGHTLY AS NEEDED FOR ALLERGIES AND INSOMNIA Patient taking differently: Take 25 mg by mouth at bedtime as needed (allerigies and insomnia). 11/01/21  Yes [name], MD  isosorbide dinitrate (ISORDIL) 30 MG tablet Take 30 mg by mouth 3 (three) times daily. 02/21/22  Yes [provider]  lidocaine (ASPERCREME LIDOCAINE) 4 % Place 1 patch onto the skin daily. Wear for 12 hours  then remove, wait 12 hours before applying a new patch to lower back   Yes [provider]  Multiple Vitamins-Minerals (THERATRUM COMPLETE) TABS Take 1 tablet by mouth daily.   Yes [provider]  nitroGLYCERIN (NITROSTAT) 0.4 MG SL tablet Place 1 tablet (0.4 mg total) under the tongue every 5 (five) minutes as needed for chest pain. 09/14/21  Yes Loel Dubonnet, NP  Nutritional Supplements (FEEDING SUPPLEMENT, NEPRO CARB STEADY,) LIQD Take 237 mLs by mouth 2 (two) times daily with a meal.   Yes [provider]  omeprazole (PRILOSEC) 40 MG capsule Take 40 mg by mouth in the morning and at bedtime.   Yes [provider]  ondansetron (ZOFRAN) 4 MG tablet Take 4 mg by mouth every 8 (eight) hours as needed for nausea or vomiting.   Yes [provider]  ondansetron (ZOFRAN-ODT) 4 MG disintegrating tablet Take 4 mg by mouth every 8 (eight) hours as needed. 02/20/22  Yes [provider]  REPATHA SURECLICK 786 MG/ML SOAJ  Inject 140 mg as directed every 14 (fourteen) days. Patient taking differently: Inject 140 mg as directed every 14 (fourteen) days. Starting 02/08/22 09/14/21  Yes Loel Dubonnet, NP  sertraline (ZOLOFT) 100 MG tablet 1 daily 02/11/22  Yes [name], [name], MD  torsemide (DEMADEX) 20 MG tablet Take 40 mg by mouth daily. 02/21/22  Yes [provider]  traZODone (DESYREL) 50 MG tablet Take 0.5-1 tablets (25-50 mg total) by mouth at bedtime as needed for sleep. 02/11/22  Yes [name], MD  acetaminophen (TYLENOL) 325 MG tablet Take 2 tablets (650 mg total) by mouth every 6 (six) hours as needed for mild pain, moderate pain or fever. 08/02/21   Allie Bossier, MD  cholecalciferol (VITAMIN D) 25 MCG (1000 UNIT) tablet Take 1,000 Units by mouth daily. Patient not taking: Reported on 02/28/2022    [provider]  insulin aspart (NOVOLOG FLEXPEN) 100 UNIT/ML FlexPen Inject into the skin. Patient not taking: Reported on 02/28/2022 02/12/22   [provider]  insulin aspart (NOVOLOG) 100 UNIT/ML injection Inject 0-15 Units into the skin 3 (three) times daily before meals. Per sliding scale Patient not taking: Reported on 02/28/2022    [provider]  Insulin Pen Needle (B-D ULTRAFINE III SHORT PEN) 31G X 8 MM MISC 1 each by Does not apply route as directed. 05/30/19   Cassandria Anger, MD  isosorbide mononitrate (IMDUR) 30 MG 24 hr tablet Take 1 tablet (30 mg total) by mouth daily. Patient not taking: Reported on 02/28/2022 02/04/22   Niel Hummer A, MD  metoprolol tartrate (LOPRESSOR) 50 MG tablet Take 1 tablet (50 mg total) by mouth 2 (two) times daily. Patient not taking: Reported on 02/28/2022 02/03/22   Regalado, Jerald Kief A, MD  oxyCODONE-acetaminophen (PERCOCET/ROXICET) 5-325 MG tablet Take 1 tablet by mouth every 6 (six) hours as needed. Patient not taking: Reported on 02/28/2022 02/21/22   [provider]    Current Facility-Administered Medications   Medication Dose Route Frequency Provider Last Rate Last Admin   amiodarone (PACERONE) tablet 400 mg  400 mg Oral BID Tat, Shanon Brow, MD   400 mg at 03/06/22 2124   aspirin tablet 325 mg  325 mg Oral Daily Tat, David, MD   325 mg at 03/06/22 1238   calcium acetate (PHOSLO) capsule 667 mg  667 mg Oral TID WC Emokpae, Ejiroghene E, MD   667 mg at 03/07/22 0747   Chlorhexidine Gluconate Cloth 2 %  PADS 6 each  6 each Topical Q0600 Zierle-Ghosh, Asia B, DO   6 each at 03/06/22 0820   Chlorhexidine Gluconate Cloth 2 % PADS 6 each  6 each Topical Q0600 Corliss Parish, MD   6 each at 03/06/22 1238   Darbepoetin Alfa (ARANESP) injection 60 mcg  60 mcg Intravenous Q Barbie Haggis, MD   60 mcg at 03/04/22 1814   doxazosin (CARDURA) tablet 4 mg  4 mg Oral Daily Emokpae, Ejiroghene E, MD   4 mg at 03/06/22 0818   feeding supplement (NEPRO CARB STEADY) liquid 237 mL  237 mL Oral BID BM Johnson, Clanford L, MD   237 mL at 03/03/22 1428   heparin injection 5,000 Units  5,000 Units Subcutaneous Q8H Emokpae, Ejiroghene E, MD   5,000 Units at 03/07/22 2836   hydrALAZINE (APRESOLINE) injection 10 mg  10 mg Intravenous Q6H PRN Adefeso, Oladapo, DO   10 mg at 03/07/22 6294   hydrALAZINE (APRESOLINE) tablet 25 mg  25 mg Oral Q8H Satira Sark, MD       insulin aspart (novoLOG) injection 0-5 Units  0-5 Units Subcutaneous QHS Emokpae, Ejiroghene E, MD   3 Units at 03/06/22 2125   insulin aspart (novoLOG) injection 0-9 Units  0-9 Units Subcutaneous TID WC Emokpae, Ejiroghene E, MD   2 Units at 03/07/22 0748   isosorbide dinitrate (ISORDIL) tablet 30 mg  30 mg Oral TID Emokpae, Ejiroghene E, MD   30 mg at 03/06/22 2125   meropenem (MERREM) 500 mg in sodium chloride 0.9 % 100 mL IVPB  500 mg Intravenous Q24H Orson Eva, MD 200 mL/hr at 03/06/22 2136 500 mg at 03/06/22 2136   [START ON 03/08/2022] metoprolol succinate (TOPROL-XL) 24 hr tablet 100 mg  100 mg Oral Daily Satira Sark, MD       metoprolol  tartrate (LOPRESSOR) tablet 75 mg  75 mg Oral BID Satira Sark, MD   75 mg at 03/06/22 2125   ondansetron (ZOFRAN) tablet 4 mg  4 mg Oral Q6H PRN Emokpae, Ejiroghene E, MD   4 mg at 03/02/22 0719   Or   ondansetron (ZOFRAN) injection 4 mg  4 mg Intravenous Q6H PRN Emokpae, Ejiroghene E, MD   4 mg at 03/06/22 2325   polyethylene glycol (MIRALAX / GLYCOLAX) packet 17 g  17 g Oral Daily PRN Emokpae, Ejiroghene E, MD       prochlorperazine (COMPAZINE) injection 10 mg  10 mg Intravenous Q6H PRN Adefeso, Oladapo, DO   10 mg at 03/03/22 1146   sertraline (ZOLOFT) tablet 100 mg  100 mg Oral Daily Emokpae, Ejiroghene E, MD   100 mg at 03/06/22 0819   traZODone (DESYREL) tablet 25-50 mg  25-50 mg Oral QHS PRN Emokpae, Ejiroghene E, MD   50 mg at 03/03/22 2233    Allergies as of 02/28/2022 - Review Complete 02/28/2022  Allergen Reaction Noted   Bee venom Anaphylaxis 07/29/2015   Atorvastatin Other (See Comments) 02/20/2020   Diltiazem Itching 02/05/2021   Reglan [metoclopramide] Other (See Comments) 09/16/2021   Rosuvastatin Other (See Comments) 05/05/2020   Valsartan Itching 02/18/2021    Family History  Problem Relation Age of Onset   Colon cancer Mother    Heart Problems Father    Diabetes Father    Valvular heart disease Father    Sleep apnea Neg Hx    Stroke Neg Hx     Social History   Socioeconomic History   Marital status: Single  Spouse name: Not on file   Number of children: Not on file   Years of education: Not on file   Highest education level: Not on file  Occupational History   Not on file  Tobacco Use   Smoking status: Former    Types: Cigarettes    Quit date: 07/06/1988    Years since quitting: 33.6   Smokeless tobacco: Never  Vaping Use   Vaping Use: Never used  Substance and Sexual Activity   Alcohol use: Not Currently    Alcohol/week: 2.0 standard drinks of alcohol    Types: 2 Glasses of wine per week    Comment: socially   Drug use: No   Sexual  activity: Not on file  Other Topics Concern   Not on file  Social History Narrative   Not on file   Social Determinants of Health   Financial Resource Strain: Medium Risk (08/16/2021)   Overall Financial Resource Strain (CARDIA)    Difficulty of Paying Living Expenses: Somewhat hard  Food Insecurity: No Food Insecurity (02/28/2022)   Hunger Vital Sign    Worried About Running Out of Food in the Last Year: Never true    Ran Out of Food in the Last Year: Never true  Transportation Needs: No Transportation Needs (02/28/2022)   PRAPARE - Hydrologist (Medical): No    Lack of Transportation (Non-Medical): No  Physical Activity: Not on file  Stress: Not on file  Social Connections: Not on file  Intimate Partner Violence: Not At Risk (02/28/2022)   Humiliation, Afraid, Rape, and Kick questionnaire    Fear of Current or Ex-Partner: No    Emotionally Abused: No    Physically Abused: No    Sexually Abused: No    Review of Systems: Gen: Denies fever, chills, cold or flu like symptoms, pre-syncope, or syncope. CV: Denies chest pain, heart palpitations. Resp: Denies shortness of breath. Reports his breathing is much improved since admission.  GI: See HPI GU : Denies urinary burning, urinary frequency, urinary incontinence.  MS: Denies joint pain. Derm: Denies rash. Psych: Denies depression, anxiety. Heme: See HPI  Physical Exam: Vital signs in last 24 hours: Temp:  [98.1 F (36.7 C)-98.7 F (37.1 C)] 98.1 F (36.7 C) (09/25 0712) Pulse Rate:  [66-73] 72 (09/25 0712) Resp:  [14-28] 18 (09/25 0712) BP: (118-199)/(45-65) 172/61 (09/25 0700) SpO2:  [89 %-98 %] 96 % (09/25 0712) Weight:  [82.5 kg] 82.5 kg (09/25 0500) Last BM Date : 03/06/22 General:   Alert,  Vomiting when I walked in the room. Overall, well-developed, well-nourished.  Head:  Normocephalic and atraumatic. Eyes:  Sclera clear, no icterus.   Conjunctiva pink. Ears:  Normal auditory  acuity. Nose:  No deformity, discharge,  or lesions. Lungs:  Clear throughout to auscultation. Some decreased breath sounds in right base.  No acute distress. Heart:  Regular rate and rhythm; no murmurs, clicks, rubs,  or gallops. Abdomen:  Soft, nontender and nondistended. No masses, hepatosplenomegaly or hernias noted. Normal bowel sounds, without guarding, and without rebound.   Rectal:  Deferred   Msk:  Symmetrical without gross deformities. Normal posture. Extremities:  Without edema. Neurologic:  Alert and  oriented x4;  grossly normal neurologically. Skin:  Intact without significant lesions or rashes. Psych:  Normal mood and affect.  Intake/Output from previous day: 09/24 0701 - 09/25 0700 In: 206.9 [I.V.:206.9] Out: 200 [Urine:200] Intake/Output this shift: No intake/output data recorded.  Lab Results: Recent Labs  03/06/22 0413  WBC 9.1  HGB 8.7*  HCT 27.7*  PLT 192   BMET Recent Labs    03/06/22 0413  NA 134*  K 3.6  CL 98  CO2 25  GLUCOSE 226*  BUN 46*  CREATININE 5.66*  CALCIUM 8.2*    Impression: 65 y.o. year old male with history of DM, cardiomyopathy with systolic and diastolic CHF, CABG, ESRD on dialysis, stroke, HTN, diabetes, Barrett's esophagus, esophageal adenocarcinoma, who presented to the emergency room on 9/18 from dialysis center with reports of worsening difficulty breathing requiring supplemental O2 and BiPAP.  He was admitted with acute respiratory failure with hypoxia, acute on chronic combined systolic and diastolic CHF.  Ultimately found to have large right pleural effusion requiring thoracentesis with improvement in respiratory status.  He has also been found to have sustained SVT during this admission requiring amiodarone drip, but now on p.o. amiodarone as well as gram-negative bacteremia requiring antibiotics, but repeat blood cultures have been negative thus far (day 3).  Patient also complaining of dysphagia.  Speech therapy  evaluation 9/22.  Patient reported significant discomfort with swallowing, regurgitation of p.o..  Bedside swallow evaluation completed with patient consuming minimal trials of thin liquids and pure reporting globus sensation and discomfort, no overt aspiration.  Recommended considering GI evaluation, barium pill esophagram, dysphagia 1 diet with thin liquids.  Dysphagia:  New onset solid food dysphagia x1 month with associated postprandial regurgitation.  He does have history of esophageal adenocarcinoma diagnosed in 2022 now s/p EMR, cryotherapy, RFA in April 2023 with most recent EGD in July with esophageal mucosal changes secondary to established Barrett's disease treated with argon plasma coagulation and biopsied, medium sized hiatal hernia.  Esophageal biopsy unremarkable.  Gastric biopsy with cardio oxyntic mucosa with chronic gastritis, foveolar hyperplasia, PPI effect, no H. pylori, metaplasia, or dysplasia. PET scan  02/10/2022 with no signs of residual, recurrence, or metastasis. Notably, patient reports no dysphagia even at the time of esophageal adenocarcinoma diagnosis. Since being on puree diet, he reports items seem to go down his esophagus fine as long as he is sitting up right, eating slow, and taking small bites, otherwise, he ends up with postprandial vomiting. Reports feeling the puree items sitting in his upper abdomen in this case. It is also notable that he lost about 30 lbs between April and June, but has gained about 10 lbs back since that time with weight stable over the last month.  Chronic GERD has been well controlled on PPI BID prior to admission, though not receiving at this time.   Etiology of dysphagia isn't clear, but could be secondary to esophageal web, ring or stricture. I feel that malignancy is less likely with recent EGD 2 months ago, but unable to rule this out. Could also have underlying motility disorder. No overt aspiration with bedside swallow study though can't  rule this out as he was coughing quite a bit prior to emesis this morning.   At this point, would recommend EGD for further evaluation. Consider BPE and MBSS if EGD is unrevealing or if postprandial coughing continues.   Plan: Proceed with upper endoscopy +/- dilation with propofol by Dr. Abbey Chatters tomorrow. The risks, benefits, and alternatives have been discussed with the patient in detail. The patient states understanding and desires to proceed.   Heparin will be held after last dose tonight.  Continue Puree diet and thin liquids as tolerated for now.  NPO at midnight.  Needs to sit up right 90 degrees while  eating and remain upright for at least 60-90 minutes before reclining. Shouldn't lay flat for at least 3 hours after eating.  May benefit from BPE/MBSS if EGD is unrevealing or if postprandial coughing continues. Resume PPI BID.      LOS: 7 days    03/07/2022, 9:12 AM   Aliene Altes, [facility]

## 2022-03-07 NOTE — Progress Notes (Signed)
SLP Cancellation Note  Patient Details Name: [name] MRN: [id number] DOB: 1956/02/24   Cancelled treatment:       Reason Eval/Treat Not Completed: Other (comment) (Acknowledge GI plans for EGD tomorrow. SLP will follow up pending those results.)  Thank you,  Genene Churn, [place]   [place] 03/07/2022, 2:17 PM

## 2022-03-07 NOTE — Progress Notes (Addendum)
PROGRESS NOTE  [name] YSA:[id number] DOB: [birth year] DOA: 02/28/2022 PCP: [name], MD  Brief History:  66 y.o. male with medical history significant for CABG, ESRD, cardiomyopathy with systolic and diastolic CHF, stroke, hypertension, diabetes mellitus. Patient was brought to the ED from dialysis center with reports of worsening of his difficulty breathing.  At the time of my evaluation, patient is on BiPAP. Patient was having dialysis today and was 1 and 54 minutes into session when it had to be stopped.  He was having significant worsening of his breathing, with use of accessory muscle, was placed on 4 L nasal cannula, and BiPAP here. Patient reports has been having difficulty breathing over the past 2 days.  He reported cough productive of whitish phlegm.  On arrival to the ED there was some wheezing.   Recent hospitalization 8/19 to 8/25 for chest pain, transferred to [facility], due to history of previous CABG.  Underwent cardiac cath 8/22 which showed patent CABG.   ED Course: Temperature 98.9.  Initial heart rate 103 improved to 70s, respiratory rate initially up to 30 improved after BiPAP, blood pressure 120s to 170s.  O2 sats 90% on 4 L nasal cannula, subsequently switched to BiPAP due to tachycardia, tachypnea, accessory muscle use and increased work of breathing. Chest x-ray with mild cardiomegaly without overt pulmonary edema, small right pleural effusion slightly increased, hazy right lung base opacity favor atelectasis. VBG with pH of 7.3, PCO2 of 43.  Lactic acid 3.5.  Troponin 51.   IV ceftriaxone and azithromycin started. Initial concern for flash pulmonary edema, hence nitroglycerin drip initially started and later discontinued with improvement in respiratory status. However, the patient was noted to have gram-negative bacteremia, and he was started on cefepime empirically.  On the morning of 03/03/2022, the patient was a little more somnolent and  confused.  However, he woke up to voice and was able to answer questions appropriately when he was awake.  Work-up was undertaken to clarify etiology of his altered mental status.     Assessment and Plan: * Acute respiratory failure with hypoxia (HCC) Difficulty breathing over the past 2 days PTA -placed on BiPAP due to increased work of breathing.   -Respiratory status has improved after thora (1.3L removed).  Quit smoking cigarettes about 33 years ago. . -Started on BiPAP and nitroglycerin drip which was discontinued after improvement in respiratory status. -Hold off on further antibiotics at this time -Obtain CTA chest, I talked to nephrologist Dr. [name], okay to administer IV contrast at this time -Lactic acid 3.5 > 1.9 resolved without intervention, likely secondary to hypoxia, doubt infection. -Addendum-no PE, shows large right pleural effusion increased in size from prior.  Likely etiology for his hypoxia.  -ordered ultrasound-guided thoracentesis>>1.3L -now stable on RA  DM type 2 causing vascular disease (HCC) A1c 7.7.  Glucose 176. - continue SSI -CBGs largely controlled   Essential hypertension, benign Systolic 323F to 573U. -Resume doxazosin, Imdur, metoprolol  -hydralazine restarted by cardiology team 2/02  Acute metabolic encephalopathy 5/42/7062 AM--patient is somnolent but arouses to voice and answering questions appropriately -CT brain--neg -Ammonia--32 -CBG 151 -Check VBG 7.5/35/59/27 RA B12--564 -Discontinue any hypnotic medications -May be related to cefepime>>switch to [facility] -mental status now improved and back to baseline  Acute on chronic combined systolic and diastolic CHF (congestive heart failure) ([place]) 07/25/21 Echo--EF 40-45%, no WMA, mod RV dysfx, PASP 56.5 - BNP >4500, large right effusion on  CT - transiently on NG gtt in setting of HTN now off.  - fluid management per HD 03/01/22 Echo--EF 40-45%, global HK, RV overload -03/02/2022 UF  1.8L 03/04/22-UF 2.2L Volume overload status improving  SVT (supraventricular tachycardia) ([place]) --Pt found unresponsive in sustained SVT on 9/18, resolved after vagal maneuvers -- appreciate cardiology -continued to have SVT 9/20 evening, one episode 9/21 evening -one episode 9/22 evening lasting 8 sec -9/23-9/24>>>no SVT episodes -switched coreg to metoprolol -started amiodarone drip>>transitioned to po 03/06/22 -continue metoprolol 75 mg bid>>metoprolol succinate -overall improving - switch to po amio 9/24  Pleural effusion on right -- likely major contributor to his dyspneic symptoms -- 9/19 thora>>1.3L--appears exudative by Light's criteria -cytology negative -repeat attempt 03/04/22>> not enough fluid  Goals of care, counseling/discussion Confirmed with pt and spouse = DNR  Dysphagia Appears to be predominantly esophageal etiology Evaluated by speech>>dys1 with thin, adv as tolerated GI consult appreciated>>EGD 9/26  Hemoptysis 03/03/22 mod amounts Appreciate pulm consult Appears resolved May be related to aspiration/pulm HTN Monitor H/H -stable on RA  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 [name] --considered poor candidate for esophagectomy. After multidisciplinary discussion the plan was to continue with endoscopic treatments, and continue to follow-up with oncology for surveillance and any signs of progression of disease -status post EMR, cryotherapy, and most recently RFA in April 2023 -02/10/22 PET scan--no local recurrence   DNR (do not resuscitate) --continue DNR order while in hospital   gram neg rod Bacteremia -E. [place] -Due to altered mental status, change antibiotics to merrem -repeat blood culture neg to date  ESRD on dialysis [facility]) Started dialysis within the past month.  Schedule Monday Wednesday Friday.  HD session terminated today due to sudden worsening of his dyspnea.  I talked with Dr. Colodonato>>, okay with IV contrast  for now. -nephrology consulted to manage inpatient HD -03/02/22 UF 1.8L -03/04/22 UF 2.2L -HD 03/07/22   Obstructive sleep apnea BIPAP QHS while hospitalized.  S/P CABG (coronary artery bypass graft) Recent cardiac cath 8/22, showed patent grafts, severe three-vessel native CAD.Marland Kitchen  History of CABG 2020.  Troponin today 51 > 52, not consistent with ACS -Resume aspirin, metoprolol, Isordil   -No chest pain -03/01/2022 echo EF 40-45%, global HK, mild TR, RV overload      Family Communication:   spouse at bedside 9/25  Consultants:  GI, cardiology, pullm  Code Status:  DNR  DVT Prophylaxis:   Heparin    Procedures: As Listed in Progress Note Above  Antibiotics: Cefepime 9/19>>9/21 Merrem 9/21>>        Subjective: Patient denies fevers, chills, headache, chest pain, dyspnea, nausea, vomiting, diarrhea, abdominal pain, dysuria, hematuria, hematochezia, and melena. No more hemoptysis  Objective: Vitals:   03/07/22 1421 03/07/22 1520 03/07/22 1531 03/07/22 1600  BP:  (!) 151/65 (!) 195/58 (!) 196/56  Pulse:  69    Resp:  '20 18 18  '$ Temp: 98.1 F (36.7 C) 98.2 F (36.8 C)    TempSrc:  Oral    SpO2:  93% 100%   Weight:      Height:        Intake/Output Summary (Last 24 hours) at 03/07/2022 1631 Last data filed at 03/06/2022 2000 Gross per 24 hour  Intake 206.86 ml  Output 200 ml  Net 6.86 ml   Weight change: 2.3 kg Exam:  General:  Pt is alert, follows commands appropriately, not in acute distress HEENT: No icterus, No thrush, No neck mass, [place]/AT Cardiovascular: RRR, S1/S2, no rubs,  no gallops Respiratory: bibasilar crackles. No wheeze Abdomen: Soft/+BS, non tender, non distended, no guarding Extremities: No edema, No lymphangitis, No petechiae, No rashes, no synovitis   Data Reviewed: I have personally reviewed following labs and imaging studies Basic Metabolic Panel: Recent Labs  Lab 03/01/22 0040 03/01/22 0437 03/01/22 2123 03/02/22 0238  03/03/22 0612 03/04/22 0336 03/06/22 0413  NA 135   < > 134* 135 135 136 134*  K 4.0   < > 3.3* 3.7 3.5 3.9 3.6  CL 100   < > 98 99 100 100 98  CO2 21*   < > '24 23 24 23 25  '$ GLUCOSE 195*   < > 180* 194* 227* 151* 226*  BUN 56*   < > 49* 51* 43* 61* 46*  CREATININE 5.22*   < > 4.41* 4.76* 4.41* 5.60* 5.66*  CALCIUM 8.1*   < > 8.0* 8.1* 8.2* 8.2* 8.2*  MG 2.1  --  1.8 1.9  --  1.9 2.0  PHOS 6.1*  --   --  4.3 2.6  --   --    < > = values in this interval not displayed.   Liver Function Tests: Recent Labs  Lab 03/02/22 0238 03/03/22 0612 03/03/22 1415  AST  --   --  15  ALT  --   --  21  ALKPHOS  --   --  93  BILITOT  --   --  0.7  PROT  --   --  5.9*  ALBUMIN 3.3* 3.3* 3.1*   No results for input(s): "LIPASE", "AMYLASE" in the last 168 hours. Recent Labs  Lab 03/03/22 1415  AMMONIA 32   Coagulation Profile: No results for input(s): "INR", "PROTIME" in the last 168 hours. CBC: Recent Labs  Lab 03/01/22 0437 03/02/22 0238 03/03/22 0612 03/03/22 1415 03/04/22 0336 03/06/22 0413  WBC 7.0 6.3 5.7  --  7.5 9.1  NEUTROABS  --   --   --   --  5.4  --   HGB 9.2* 9.0* 9.4* 8.8* 8.5* 8.7*  HCT 29.2* 28.5* 29.4* 28.1* 26.8* 27.7*  MCV 85.6 85.1 85.0  --  84.8 85.0  PLT 157 148* 154  --  152 192   Cardiac Enzymes: No results for input(s): "CKTOTAL", "CKMB", "CKMBINDEX", "TROPONINI" in the last 168 hours. BNP: Invalid input(s): "POCBNP" CBG: Recent Labs  Lab 03/06/22 1131 03/06/22 1600 03/06/22 2113 03/07/22 0722 03/07/22 1146  GLUCAP 139* 169* 267* 197* 162*   HbA1C: No results for input(s): "HGBA1C" in the last 72 hours. Urine analysis:    Component Value Date/Time   COLORURINE YELLOW 02/28/2022 1414   APPEARANCEUR CLEAR 02/28/2022 1414   LABSPEC 1.014 02/28/2022 1414   PHURINE 5.0 02/28/2022 1414   GLUCOSEU >=500 (A) 02/28/2022 1414   HGBUR NEGATIVE 02/28/2022 1414   BILIRUBINUR NEGATIVE 02/28/2022 1414   KETONESUR NEGATIVE 02/28/2022 1414    PROTEINUR >=300 (A) 02/28/2022 1414   NITRITE NEGATIVE 02/28/2022 1414   LEUKOCYTESUR NEGATIVE 02/28/2022 1414   Sepsis Labs: '@LABRCNTIP'$ (procalcitonin:4,lacticidven:4) ) Recent Results (from the past 240 hour(s))  Culture, blood (Routine x 2)     Status: None   Collection Time: 02/28/22 12:38 PM   Specimen: BLOOD RIGHT FOREARM  Result Value Ref Range Status   Specimen Description   Final    BLOOD RIGHT FOREARM BOTTLES DRAWN AEROBIC AND ANAEROBIC   Special Requests Blood Culture adequate volume  Final   Culture   Final    NO GROWTH 5 DAYS Performed  at [facility], [address] [place], [place] [address]    Report Status 03/05/2022 FINAL  Final  Culture, blood (Routine x 2)     Status: Abnormal   Collection Time: 02/28/22 12:43 PM   Specimen: Right Antecubital; Blood  Result Value Ref Range Status   Specimen Description   Final    RIGHT ANTECUBITAL BOTTLES DRAWN AEROBIC AND ANAEROBIC Performed at [facility], [address] [place], [place] [address]    Special Requests   Final    Blood Culture adequate volume Performed at [facility] [place], [place] [address]    Culture  Setup Time   Final    GRAM NEGATIVE RODS Gram Stain Report Called to,Read Back By and Verified With: L HILTON RN [phone] K FORSYTH IN BOTH AEROBIC AND ANAEROBIC BOTTLES CRITICAL RESULT CALLED TO, READ BACK BY AND VERIFIED WITH: Cloyde Reams [id number] '@1700'$  FH Performed at [facility], [address] [place], [place] [address]    Culture ENTEROBACTER CLOACAE (A)  Final   Report Status 03/03/2022 FINAL  Final   Organism ID, Bacteria ENTEROBACTER CLOACAE  Final      Susceptibility   Enterobacter cloacae - MIC*    CEFAZOLIN >=64 RESISTANT Resistant     CEFEPIME <=0.12 SENSITIVE Sensitive     CEFTAZIDIME <=1 SENSITIVE Sensitive     CIPROFLOXACIN <=0.25 SENSITIVE Sensitive     GENTAMICIN <=1 SENSITIVE Sensitive     IMIPENEM 0.5 SENSITIVE Sensitive     TRIMETH/SULFA <=20 SENSITIVE  Sensitive     PIP/TAZO <=4 SENSITIVE Sensitive     * ENTEROBACTER CLOACAE  Blood Culture ID Panel (Reflexed)     Status: Abnormal   Collection Time: 02/28/22 12:43 PM  Result Value Ref Range Status   Enterococcus faecalis NOT DETECTED NOT DETECTED Final   Enterococcus Faecium NOT DETECTED NOT DETECTED Final   Listeria monocytogenes NOT DETECTED NOT DETECTED Final   Staphylococcus species NOT DETECTED NOT DETECTED Final   Staphylococcus aureus (BCID) NOT DETECTED NOT DETECTED Final   Staphylococcus epidermidis NOT DETECTED NOT DETECTED Final   Staphylococcus lugdunensis NOT DETECTED NOT DETECTED Final   Streptococcus species NOT DETECTED NOT DETECTED Final   Streptococcus agalactiae NOT DETECTED NOT DETECTED Final   Streptococcus pneumoniae NOT DETECTED NOT DETECTED Final   Streptococcus pyogenes NOT DETECTED NOT DETECTED Final   A.calcoaceticus-baumannii NOT DETECTED NOT DETECTED Final   Bacteroides fragilis NOT DETECTED NOT DETECTED Final   Enterobacterales DETECTED (A) NOT DETECTED Final    Comment: Enterobacterales represent a large order of gram negative bacteria, not a single organism. CRITICAL RESULT CALLED TO, READ BACK BY AND VERIFIED WITH: PHARMD S. HURTH [id number] '@1700'$  FH    Enterobacter cloacae complex DETECTED (A) NOT DETECTED Final    Comment: CRITICAL RESULT CALLED TO, READ BACK BY AND VERIFIED WITH: PHARMD S. Rogene [place] [id number] '@1700'$  FH    Escherichia coli NOT DETECTED NOT DETECTED Final   Klebsiella aerogenes NOT DETECTED NOT DETECTED Final   Klebsiella oxytoca NOT DETECTED NOT DETECTED Final   Klebsiella pneumoniae NOT DETECTED NOT DETECTED Final   Proteus species NOT DETECTED NOT DETECTED Final   Salmonella species NOT DETECTED NOT DETECTED Final   Serratia marcescens NOT DETECTED NOT DETECTED Final   Haemophilus influenzae NOT DETECTED NOT DETECTED Final   Neisseria meningitidis NOT DETECTED NOT DETECTED Final   Pseudomonas aeruginosa NOT DETECTED NOT DETECTED Final    Stenotrophomonas maltophilia NOT DETECTED NOT DETECTED Final   Candida albicans NOT DETECTED  NOT DETECTED Final   Candida auris NOT DETECTED NOT DETECTED Final   Candida glabrata NOT DETECTED NOT DETECTED Final   Candida krusei NOT DETECTED NOT DETECTED Final   Candida parapsilosis NOT DETECTED NOT DETECTED Final   Candida tropicalis NOT DETECTED NOT DETECTED Final   Cryptococcus neoformans/gattii NOT DETECTED NOT DETECTED Final   CTX-M ESBL NOT DETECTED NOT DETECTED Final   Carbapenem resistance IMP NOT DETECTED NOT DETECTED Final   Carbapenem resistance KPC NOT DETECTED NOT DETECTED Final   Carbapenem resistance NDM NOT DETECTED NOT DETECTED Final   Carbapenem resist OXA 48 LIKE NOT DETECTED NOT DETECTED Final   Carbapenem resistance VIM NOT DETECTED NOT DETECTED Final    Comment: Performed at [facility] [address] [place], [place] [address]  MRSA Next Gen by PCR, Nasal     Status: None   Collection Time: 02/28/22 10:20 PM   Specimen: Nasal Mucosa; Nasal Swab  Result Value Ref Range Status   MRSA by PCR Next Gen NOT DETECTED NOT DETECTED Final    Comment: (NOTE) The GeneXpert MRSA Assay (FDA approved for NASAL specimens only), is one component of a comprehensive MRSA colonization surveillance program. It is not intended to diagnose MRSA infection nor to guide or monitor treatment for MRSA infections. Test performance is not FDA approved in patients less than 61 years old. Performed at [facility], [address], [place], [place] [address]   Gram stain     Status: None   Collection Time: 03/01/22 10:20 AM   Specimen: Pleura  Result Value Ref Range Status   Specimen Description PLEURAL  Final   Special Requests PLEURAL  Final   Gram Stain   Final    NO ORGANISMS SEEN WBC PRESENT,BOTH PMN AND MONONUCLEAR CYTOSPIN SMEAR Performed at [facility], [address] [place], [place] [address]    Report Status 03/01/2022 FINAL  Final  Culture, body fluid w Gram  Stain-bottle     Status: None   Collection Time: 03/01/22 10:20 AM   Specimen: Pleura  Result Value Ref Range Status   Specimen Description PLEURAL  Final   Special Requests   Final    BOTTLES DRAWN AEROBIC AND ANAEROBIC Blood Culture adequate volume   Culture   Final    NO GROWTH 5 DAYS Performed at [facility], [address], [place], [place] [address]    Report Status 03/06/2022 FINAL  Final  Culture, blood (Routine X 2) w Reflex to ID Panel     Status: None (Preliminary result)   Collection Time: 03/03/22  7:56 PM   Specimen: BLOOD RIGHT HAND  Result Value Ref Range Status   Specimen Description BLOOD RIGHT HAND  Final   Special Requests   Final    BOTTLES DRAWN AEROBIC AND ANAEROBIC Blood Culture adequate volume   Culture   Final    NO GROWTH 3 DAYS Performed at [facility], [address] [place], [place] [address]    Report Status PENDING  Incomplete  Culture, blood (Routine X 2) w Reflex to ID Panel     Status: None (Preliminary result)   Collection Time: 03/03/22  7:56 PM   Specimen: BLOOD RIGHT HAND  Result Value Ref Range Status   Specimen Description BLOOD RIGHT HAND  Final   Special Requests   Final    BOTTLES DRAWN AEROBIC AND ANAEROBIC Blood Culture adequate volume   Culture   Final    NO GROWTH 3 DAYS Performed at [facility], [address] [place], [place] [address]    Report Status PENDING  Incomplete     Scheduled Meds:  amiodarone  400 mg Oral BID   aspirin  325 mg Oral Daily   calcium acetate  667 mg Oral TID WC   Chlorhexidine Gluconate Cloth  6 each Topical Q0600   Chlorhexidine Gluconate Cloth  6 each Topical Q0600   darbepoetin (ARANESP) injection - DIALYSIS  60 mcg Intravenous Q Fri-HD   doxazosin  4 mg Oral Daily   feeding supplement (NEPRO CARB STEADY)  237 mL Oral BID BM   heparin  5,000 Units Subcutaneous Q8H   hydrALAZINE  25 mg Oral Q8H   insulin aspart  0-5 Units Subcutaneous QHS   insulin aspart  0-9 Units Subcutaneous TID WC    isosorbide dinitrate  30 mg Oral TID   [START ON 03/08/2022] metoprolol succinate  100 mg Oral Daily   metoprolol tartrate  75 mg Oral BID   pantoprazole  40 mg Oral BID   sertraline  100 mg Oral Daily   Continuous Infusions:  meropenem (MERREM) IV 500 mg (03/06/22 2136)    Procedures/Studies: LONG TERM MONITOR-LIVE TELEMETRY (3-14 DAYS)  Result Date: 03/06/2022 14 Day Zio Monitor Quality: Fair.  Baseline artifact. Predominant rhythm: Sinus rhythm Average heart rate: 71 bpm Max heart rate: 94 bpm Min heart rate: 57 bpm Pauses >2.5 seconds: none Up to 19 beat runs of NSVT.  Maximum rate 160 bpm 17 SVT runs lasting up to 46 seconds.  Maximum rate 171 bpm Rare PACs Occasional PVCs (1.2%).  Rare ventricular couplets and triplets Occasional ventricular bigeminy and trigeminy Tiffany C. Oval Linsey, MD, [facility] 03/06/2022 1:23 PM  [place] CHEST (PLEURAL EFFUSION)  Result Date: 03/04/2022 CLINICAL DATA:  Pleural effusion EXAM: CHEST ULTRASOUND COMPARISON:  Radiograph 03/03/2022 FINDINGS: There is a small right pleural effusion. IMPRESSION: Small right pleural effusion. Electronically Signed   By: Maurine Simmering M.D.   On: 03/04/2022 09:29   DG CHEST PORT 1 VIEW  Result Date: 03/03/2022 CLINICAL DATA:  Coughing up blood today EXAM: PORTABLE CHEST 1 VIEW COMPARISON:  Radiograph 03/01/2022 FINDINGS: Unchanged mild cardiomegaly. Prior median sternotomy and postsurgical changes of CABG. Right neck catheter tip overlies the right atrium. Central pulmonary vascular congestion. Stable small right pleural effusion. No evidence of pneumothorax. Bones are unchanged. IMPRESSION: Mild cardiomegaly and central pulmonary vascular congestion. Stable small right pleural effusion.  Bibasilar atelectasis. Electronically Signed   By: Maurine Simmering M.D.   On: 03/03/2022 16:41   CT ABDOMEN PELVIS WO CONTRAST  Result Date: 03/03/2022 CLINICAL DATA:  Abdominal pain, acute, nonlocalized. Recent thoracentesis. Esophageal cancer. EXAM: CT  ABDOMEN AND PELVIS WITHOUT CONTRAST TECHNIQUE: Multidetector CT imaging of the abdomen and pelvis was performed following the standard protocol without IV contrast. RADIATION DOSE REDUCTION: This exam was performed according to the departmental dose-optimization program which includes automated exposure control, adjustment of the mA and/or kV according to patient size and/or use of iterative reconstruction technique. COMPARISON:  CT of the abdomen and pelvis 01/23/2022. PET scan 02/10/2022. FINDINGS: Lower chest: Moderate size right pleural effusion is present. Mild dependent atelectasis is associated. There is no scratched at no pneumothorax is present. Dense coronary artery calcifications are present. No significant pericardial effusion is present. Hepatobiliary: No focal liver abnormality is seen. No gallstones, gallbladder wall thickening, or biliary dilatation. Pancreas: Unremarkable. No pancreatic ductal dilatation or surrounding inflammatory changes. Spleen: Normal in size without focal abnormality. Adrenals/Urinary Tract: Adrenal glands are within normal limits  bilaterally. Stable bilateral simple cysts are present. Chronic stranding about both kidneys is stable. No other mass lesion or stone is present. No obstruction is present. Ureters are within normal limits. The urinary bladder is within normal limits. Stomach/Bowel: Stomach and duodenum are within normal limits. Small bowel is unremarkable. Terminal ileum is normal. The appendix is visualized and within normal limits. Loops of small bowel are present in the anterior right abdomen, likely representing adhesions. The ascending and transverse colon are within normal limits. Descending and sigmoid colon are normal. Vascular/Lymphatic: Atherosclerotic calcifications are present in the aorta and branch vessels. No aneurysm is present. Reproductive: Prostate measures 5.4 cm in transverse diameter. Other: No abdominal wall hernia or abnormality. No  abdominopelvic ascites. Musculoskeletal: Vertebral body heights and alignment are normal. No focal osseous abnormalities are present. Bony pelvis is within normal limits. Hips are located and normal. IMPRESSION: 1. Moderate size right pleural effusion and associated atelectasis. 2. No acute or focal lesion to explain the patient's abdominal pain. 3. Loops of small bowel in the anterior right abdomen, likely representing adhesions. No obstruction is associated. 4. Coronary artery disease. 5. Aortic Atherosclerosis (ICD10-I70.0). Electronically Signed   By: San Morelle M.D.   On: 03/03/2022 13:43   CT HEAD WO CONTRAST (5MM)  Result Date: 03/03/2022 CLINICAL DATA:  Altered mental status. EXAM: CT HEAD WITHOUT CONTRAST TECHNIQUE: Contiguous axial images were obtained from the base of the skull through the vertex without intravenous contrast. RADIATION DOSE REDUCTION: This exam was performed according to the departmental dose-optimization program which includes automated exposure control, adjustment of the mA and/or kV according to patient size and/or use of iterative reconstruction technique. COMPARISON:  None Available. FINDINGS: Brain: Mild chronic ischemic white matter disease. No mass effect or midline shift is noted. Ventricular size is within normal limits. There is no evidence of mass lesion, hemorrhage or acute infarction. Vascular: No hyperdense vessel or unexpected calcification. Skull: Normal. Negative for fracture or focal lesion. Sinuses/Orbits: No acute finding. Other: None. IMPRESSION: No acute intracranial abnormality seen. Electronically Signed   By: Marijo Conception M.D.   On: 03/03/2022 13:31   ECHOCARDIOGRAM COMPLETE  Result Date: 03/01/2022    ECHOCARDIOGRAM REPORT   Patient Name:   [name] Date of Exam: 03/01/2022 Medical Rec #:  [id number]        Height:       70.0 in Accession #:    [id number]       Weight:       181.4 lb Date of Birth:  1955/11/01        BSA:          2.003  m Patient Age:    42 years         BP:           131/55 mmHg Patient Gender: M                HR:           69 bpm. Exam Location:  [facility] Procedure: 2D Echo, Cardiac Doppler and Color Doppler Indications:    Dyspnea  History:        Patient has prior history of Echocardiogram examinations, most                 recent 07/25/2021. CHF and Cardiomyopathy, CAD, Prior CABG,                 Stroke and PAD, Arrythmias:Tachycardia, Signs/Symptoms:Shortness  of Breath, Edema and Chest Pain; Risk Factors:Hypertension,                 Diabetes and Dyslipidemia.  Sonographer:    Wenda Low Referring Phys: [id number] [place]  1. Left ventricular ejection fraction, by estimation, is 40 to 45%. The left ventricle has mildly decreased function. The left ventricle demonstrates global hypokinesis. There is moderate left ventricular hypertrophy. Left ventricular diastolic parameters are indeterminate.  2. Ventricular septum is flattened in systole consistent with RV pressure overload. . Right ventricular systolic function is moderately reduced. The right ventricular size is severely enlarged. There is mildly elevated pulmonary artery systolic pressure.  3. Left atrial size was severely dilated.  4. Catheter tip noted in RA. Right atrial size was severely dilated.  5. The mitral valve is normal in structure. No evidence of mitral valve regurgitation. No evidence of mitral stenosis.  6. The tricuspid valve is abnormal.  7. The aortic valve is tricuspid. There is mild calcification of the aortic valve. There is mild thickening of the aortic valve. Aortic valve regurgitation is not visualized. No aortic stenosis is present.  8. The inferior vena cava is dilated in size with >50% respiratory variability, suggesting right atrial pressure of 8 mmHg. FINDINGS  Left Ventricle: Left ventricular ejection fraction, by estimation, is 40 to 45%. The left ventricle has mildly decreased function. The left  ventricle demonstrates global hypokinesis. The left ventricular internal cavity size was normal in size. There is  moderate left ventricular hypertrophy. Left ventricular diastolic parameters are indeterminate. Right Ventricle: Ventricular septum is flattened in systole consistent with RV pressure overload. The right ventricular size is severely enlarged. Right vetricular wall thickness was not well visualized. Right ventricular systolic function is moderately reduced. There is mildly elevated pulmonary artery systolic pressure. The tricuspid regurgitant velocity is 2.91 m/s, and with an assumed right atrial pressure of 8 mmHg, the estimated right ventricular systolic pressure is 93.7 mmHg. Left Atrium: Left atrial size was severely dilated. Right Atrium: Catheter tip noted in RA. Right atrial size was severely dilated. Pericardium: There is no evidence of pericardial effusion. Mitral Valve: The mitral valve is normal in structure. No evidence of mitral valve regurgitation. No evidence of mitral valve stenosis. MV peak gradient, 4.2 mmHg. The mean mitral valve gradient is 2.0 mmHg. Tricuspid Valve: The tricuspid valve is abnormal. Tricuspid valve regurgitation is mild . No evidence of tricuspid stenosis. Aortic Valve: The aortic valve is tricuspid. There is mild calcification of the aortic valve. There is mild thickening of the aortic valve. There is mild aortic valve annular calcification. Aortic valve regurgitation is not visualized. No aortic stenosis  is present. Aortic valve mean gradient measures 3.5 mmHg. Aortic valve peak gradient measures 7.2 mmHg. Aortic valve area, by VTI measures 2.94 cm. Pulmonic Valve: The pulmonic valve was not well visualized. Pulmonic valve regurgitation is not visualized. No evidence of pulmonic stenosis. Aorta: The aortic root is normal in size and structure. Venous: The inferior vena cava is dilated in size with greater than 50% respiratory variability, suggesting right atrial  pressure of 8 mmHg. IAS/Shunts: No atrial level shunt detected by color flow Doppler.  LEFT VENTRICLE PLAX 2D LVIDd:         5.40 cm      Diastology LVIDs:         4.30 cm      LV e' medial:    5.87 cm/s LV PW:  1.40 cm      LV E/e' medial:  14.5 LV IVS:        1.20 cm      LV e' lateral:   11.70 cm/s LVOT diam:     2.10 cm      LV E/e' lateral: 7.3 LV SV:         81 LV SV Index:   41 LVOT Area:     3.46 cm  LV Volumes (MOD) LV vol d, MOD A2C: 118.0 ml LV vol d, MOD A4C: 98.3 ml LV vol s, MOD A2C: 85.2 ml LV vol s, MOD A4C: 48.6 ml LV SV MOD A2C:     32.8 ml LV SV MOD A4C:     98.3 ml LV SV MOD BP:      39.3 ml RIGHT VENTRICLE RV Basal diam:  4.80 cm RV Mid diam:    3.50 cm RV S prime:     10.10 cm/s TAPSE (M-mode): 1.7 cm LEFT ATRIUM              Index        RIGHT ATRIUM           Index LA diam:        5.80 cm  2.90 cm/m   RA Area:     26.40 cm LA Vol (A2C):   140.0 ml 69.91 ml/m  RA Volume:   84.40 ml  42.14 ml/m LA Vol (A4C):   91.4 ml  45.64 ml/m LA Biplane Vol: 114.0 ml 56.92 ml/m  AORTIC VALVE                    PULMONIC VALVE AV Area (Vmax):    2.66 cm     PV Vmax:       0.87 m/s AV Area (Vmean):   2.71 cm     PV Peak grad:  3.0 mmHg AV Area (VTI):     2.94 cm AV Vmax:           134.00 cm/s AV Vmean:          88.233 cm/s AV VTI:            0.277 m AV Peak Grad:      7.2 mmHg AV Mean Grad:      3.5 mmHg LVOT Vmax:         103.00 cm/s LVOT Vmean:        69.000 cm/s LVOT VTI:          0.235 m LVOT/AV VTI ratio: 0.85  AORTA Ao Root diam: 3.90 cm Ao Asc diam:  3.40 cm MITRAL VALVE               TRICUSPID VALVE MV Area (PHT): 3.05 cm    TR Peak grad:   33.9 mmHg MV Area VTI:   2.56 cm    TR Vmax:        291.00 cm/s MV Peak grad:  4.2 mmHg MV Mean grad:  2.0 mmHg    SHUNTS MV Vmax:       1.02 m/s    Systemic VTI:  0.24 m MV Vmean:      61.2 cm/s   Systemic Diam: 2.10 cm MV Decel Time: 249 msec MV E velocity: 85.30 cm/s MV A velocity: 75.80 cm/s MV E/A ratio:  1.13 Carlyle Dolly MD  Electronically signed by Carlyle Dolly MD Signature Date/Time: 03/01/2022/1:08:36 PM    Final    DG Chest 1 View  Result Date: 03/01/2022 CLINICAL DATA:  Status post right thoracentesis. EXAM: CHEST  1 VIEW COMPARISON:  CT chest and chest x-ray from yesterday. FINDINGS: Unchanged tunneled right internal jugular dialysis catheter. Unchanged mild cardiomegaly status post CABG. Decreased now trace small right pleural effusion. No pneumothorax. Improved aeration of the right lower lobe. No acute osseous abnormality. IMPRESSION: 1. Decreased right pleural effusion status post thoracentesis. No pneumothorax. Electronically Signed   By: Titus Dubin M.D.   On: 03/01/2022 11:26   US THORACENTESIS ASP PLEURAL SPACE W/IMG GUIDE  Result Date: 03/01/2022 INDICATION: Right pleural effusion. EXAM: ULTRASOUND GUIDED RIGHT THORACENTESIS MEDICATIONS: None. COMPLICATIONS: None immediate. PROCEDURE: An ultrasound guided thoracentesis was thoroughly discussed with the patient and questions answered. The benefits, risks, alternatives and complications were also discussed. The patient understands and wishes to proceed with the procedure. Written consent was obtained. Ultrasound was performed to localize and mark an adequate pocket of fluid in the right chest. The area was then prepped and draped in the normal sterile fashion. 1% Lidocaine was used for local anesthesia. Under ultrasound guidance a 6 Fr Safe-T-Centesis catheter was introduced. Thoracentesis was performed. The catheter was removed and a dressing applied. FINDINGS: A total of approximately 1.3 L of dark yellow fluid was removed. Samples were sent to the laboratory as requested by the clinical team. IMPRESSION: Successful ultrasound guided right thoracentesis yielding 1.3 L of pleural fluid. Electronically Signed   By: Titus Dubin M.D.   On: 03/01/2022 11:23   CT Angio Chest Pulmonary Embolism (PE) W or WO Contrast  Result Date: 02/28/2022 CLINICAL DATA:   Acute respiratory failure, cough, shortness of breath, hypoxia EXAM: CT ANGIOGRAPHY CHEST WITH CONTRAST TECHNIQUE: Multidetector CT imaging of the chest was performed using the standard protocol during bolus administration of intravenous contrast. Multiplanar CT image reconstructions and MIPs were obtained to evaluate the vascular anatomy. RADIATION DOSE REDUCTION: This exam was performed according to the departmental dose-optimization program which includes automated exposure control, adjustment of the mA and/or kV according to patient size and/or use of iterative reconstruction technique. CONTRAST:  50m OMNIPAQUE IOHEXOL 350 MG/ML SOLN COMPARISON:  01/23/2022 FINDINGS: Cardiovascular: There is homogeneous enhancement in thoracic aorta. Coronary artery calcifications are seen. There is previous coronary artery bypass surgery. There is ectasia of main pulmonary artery measuring 4.1 cm suggesting pulmonary arterial hypertension. There are no intraluminal filling defects in central pulmonary artery branches. Evaluation of small peripheral pulmonary artery branches is limited by motion artifacts and atelectasis. Heart is enlarged in size. There is reflux of contrast into hepatic veins suggesting tricuspid incompetence. Tip of right IJ central venous catheter is seen in the region of right atrium. Mediastinum/Nodes: No new significant lymphadenopathy seen. Lungs/Pleura: There is large right pleural effusion with significant interval increase. Density measurements in the right pleural effusion are less than 20 Hounsfield units. There is minimal left pleural effusion. There is compression atelectasis in right lower lobe and to a lesser extent in right middle lobe and right upper lobe. No focal infiltrates are seen in left lung. There is no pneumothorax. Upper Abdomen: There is fatty infiltration in the liver. Spleen is enlarged measuring 15.4 cm in AP diameter. Multiple diverticula are seen in visualized portions of  colon. Musculoskeletal: Deformity in the right tenth rib may suggest old ununited fracture. Review of the MIP images confirms the above findings. IMPRESSION: There is no evidence of central pulmonary artery embolism. Evaluation of small peripheral pulmonary artery branches is limited by motion artifacts. There is ectasia of  main pulmonary artery suggesting pulmonary arterial hypertension. Coronary artery disease. Previous coronary artery bypass surgery. Cardiomegaly. Tricuspid incompetence. There is large right pleural effusion with significant interval increase in size. Compression atelectasis is seen in right lung, especially in right lower lobe. There is minimal left pleural effusion. Fatty liver.  Enlarged spleen.  Diverticulosis of colon. Other findings as described in the body of the report. Electronically Signed   By: Elmer Picker M.D.   On: 02/28/2022 18:07   DG Chest Port 1 View  Result Date: 02/28/2022 CLINICAL DATA:  Chest pain and dyspnea EXAM: PORTABLE CHEST 1 VIEW COMPARISON:  01/29/2022 chest radiograph. FINDINGS: Intact sternotomy wires. Pacer pad overlies the left chest. Right internal jugular central venous catheter terminates over the cavoatrial junction. Stable cardiomediastinal silhouette with mild cardiomegaly. No pneumothorax. Small right pleural effusion is slightly increased. No left pleural effusion. Slightly low lung volumes, similar. Cephalization of the pulmonary vasculature without overt pulmonary edema. Mild hazy right lung base opacity is mildly increased. IMPRESSION: 1. Stable mild cardiomegaly without overt pulmonary edema. 2. Small right pleural effusion, slightly increased. 3. Mildly increased hazy right lung base opacity, favor atelectasis. Electronically Signed   By: Ilona Sorrel M.D.   On: 02/28/2022 13:40   NM PET Image Restage (PS) Skull Base to Thigh (F-18 FDG)  Result Date: 02/11/2022 CLINICAL DATA:  Subsequent treatment strategy for esophageal carcinoma.  EXAM: NUCLEAR MEDICINE PET SKULL BASE TO THIGH TECHNIQUE: 9.91 mCi F-18 FDG was injected intravenously. Full-ring PET imaging was performed from the skull base to thigh after the radiotracer. CT data was obtained and used for attenuation correction and anatomic localization. Fasting blood glucose: 228 mg/dl COMPARISON:  07/15/2021 FINDINGS: Mediastinal blood pool activity: SUV max 2.22 Liver activity: SUV max NA NECK: No hypermetabolic lymph nodes in the neck. Incidental CT findings: None CHEST: No tracer avid supraclavicular, axillary, mediastinal, or hilar lymph nodes. No abnormal hypermetabolism associated with the esophagus. No tracer avid pulmonary nodules. Small to moderate right pleural effusion is again noted. Incidental CT findings: Cardiac enlargement. Aortic atherosclerosis. Status post CABG. ABDOMEN/PELVIS: No abnormal hypermetabolic activity within the liver, pancreas, adrenal glands, or spleen. No hypermetabolic lymph nodes in the abdomen or pelvis. Incidental CT findings: Bilateral fluid density kidney cysts are again noted compatible with simple cysts. No follow-up imaging recommended. Similar appearance of nonspecific bilateral perinephric fat stranding. Decrease abdominal ascites. Sigmoid diverticulosis without signs of acute diverticulitis. SKELETON: No focal hypermetabolic activity to suggest skeletal metastasis. Incidental CT findings: Subacute to chronic scratch set healing right posterior rib fractures are again noted. Chronic right anterior rib fracture deformities are also again noted. IMPRESSION: 1. There are no signs to suggest residual or recurrent FDG avid tumor within the esophagus. No FDG avid adenopathy or signs of solid organ metastasis. 2. Persistent right pleural effusion.  Decrease in ascites. 3. Cardiac enlargement. Status post CABG. Aortic Atherosclerosis (ICD10-I70.0). Electronically Signed   By: Kerby Moors M.D.   On: 02/11/2022 07:47    Orson Eva, DO  [facility]  If 7PM-7AM, please contact night-coverage [email or web address] Password [email or web address] 03/07/2022, 4:31 PM   LOS: 7 days

## 2022-03-08 ENCOUNTER — Inpatient Hospital Stay (HOSPITAL_COMMUNITY): Payer: Medicare HMO | Admitting: Anesthesiology

## 2022-03-08 ENCOUNTER — Encounter (HOSPITAL_COMMUNITY): Payer: Self-pay | Admitting: Internal Medicine

## 2022-03-08 ENCOUNTER — Telehealth: Payer: Self-pay | Admitting: Gastroenterology

## 2022-03-08 ENCOUNTER — Encounter (HOSPITAL_COMMUNITY)
Admission: EM | Disposition: A | Discharge status: Home Health | Payer: Self-pay | Source: Home / Self Care | Attending: Internal Medicine

## 2022-03-08 DIAGNOSIS — K209 Esophagitis, unspecified without bleeding: Secondary | ICD-10-CM

## 2022-03-08 DIAGNOSIS — N186 End stage renal disease: Secondary | ICD-10-CM

## 2022-03-08 DIAGNOSIS — I471 Supraventricular tachycardia: Secondary | ICD-10-CM | POA: Diagnosis not present

## 2022-03-08 DIAGNOSIS — I5020 Unspecified systolic (congestive) heart failure: Secondary | ICD-10-CM | POA: Diagnosis not present

## 2022-03-08 DIAGNOSIS — I5042 Chronic combined systolic (congestive) and diastolic (congestive) heart failure: Secondary | ICD-10-CM

## 2022-03-08 DIAGNOSIS — B3781 Candidal esophagitis: Secondary | ICD-10-CM

## 2022-03-08 DIAGNOSIS — I132 Hypertensive heart and chronic kidney disease with heart failure and with stage 5 chronic kidney disease, or end stage renal disease: Secondary | ICD-10-CM

## 2022-03-08 DIAGNOSIS — Z992 Dependence on renal dialysis: Secondary | ICD-10-CM

## 2022-03-08 DIAGNOSIS — R7881 Bacteremia: Secondary | ICD-10-CM | POA: Diagnosis not present

## 2022-03-08 DIAGNOSIS — K449 Diaphragmatic hernia without obstruction or gangrene: Secondary | ICD-10-CM

## 2022-03-08 DIAGNOSIS — J9601 Acute respiratory failure with hypoxia: Secondary | ICD-10-CM | POA: Diagnosis not present

## 2022-03-08 DIAGNOSIS — I5043 Acute on chronic combined systolic (congestive) and diastolic (congestive) heart failure: Secondary | ICD-10-CM | POA: Diagnosis not present

## 2022-03-08 DIAGNOSIS — K222 Esophageal obstruction: Secondary | ICD-10-CM

## 2022-03-08 DIAGNOSIS — G9341 Metabolic encephalopathy: Secondary | ICD-10-CM | POA: Diagnosis not present

## 2022-03-08 DIAGNOSIS — Z87891 Personal history of nicotine dependence: Secondary | ICD-10-CM

## 2022-03-08 HISTORY — PX: ESOPHAGEAL BRUSHING: SHX6842

## 2022-03-08 HISTORY — PX: ESOPHAGOGASTRODUODENOSCOPY (EGD) WITH PROPOFOL: SHX5813

## 2022-03-08 LAB — CULTURE, BLOOD (ROUTINE X 2)
Culture: NO GROWTH
Culture: NO GROWTH
Special Requests: ADEQUATE
Special Requests: ADEQUATE

## 2022-03-08 LAB — GLUCOSE, CAPILLARY
Glucose-Capillary: 201 mg/dL — ABNORMAL HIGH (ref 70–99)
Glucose-Capillary: 112 mg/dL — ABNORMAL HIGH (ref 70–99)

## 2022-03-08 LAB — KOH PREP: KOH Prep: NONE SEEN

## 2022-03-08 SURGERY — ESOPHAGOGASTRODUODENOSCOPY (EGD) WITH PROPOFOL
Anesthesia: General

## 2022-03-08 MED ORDER — CEFDINIR 300 MG PO CAPS: 300.0000 mg | ORAL_CAPSULE | Freq: Every day | ORAL | Status: DC

## 2022-03-08 MED ORDER — CEFDINIR 300 MG PO CAPS: 300.0000 mg | ORAL_CAPSULE | Freq: Every day | ORAL | 0 refills | Status: DC

## 2022-03-08 MED ORDER — PROPOFOL 10 MG/ML IV BOLUS
INTRAVENOUS | Status: DC | PRN
  Administered 2022-03-08: 40 mg via INTRAVENOUS

## 2022-03-08 MED ORDER — FLUCONAZOLE 100 MG PO TABS: 100.0000 mg | ORAL_TABLET | Freq: Every day | ORAL | Status: DC

## 2022-03-08 MED ORDER — HYDRALAZINE HCL 50 MG PO TABS: 50.0000 mg | ORAL_TABLET | Freq: Three times a day (TID) | ORAL | 1 refills | Status: DC

## 2022-03-08 MED ORDER — METOPROLOL SUCCINATE ER 50 MG PO TB24
100.0000 mg | ORAL_TABLET | Freq: Every day | ORAL | Status: DC
  Filled 2022-03-08: qty 2

## 2022-03-08 MED ORDER — METOPROLOL SUCCINATE ER 100 MG PO TB24: 100.0000 mg | ORAL_TABLET | Freq: Every day | ORAL | 1 refills | Status: DC

## 2022-03-08 MED ORDER — FLUCONAZOLE 100 MG PO TABS
200.0000 mg | ORAL_TABLET | Freq: Once | ORAL | Status: AC
  Administered 2022-03-08: 200 mg via ORAL
  Filled 2022-03-08: qty 2

## 2022-03-08 MED ORDER — PANTOPRAZOLE SODIUM 40 MG PO TBEC: 40.0000 mg | DELAYED_RELEASE_TABLET | Freq: Two times a day (BID) | ORAL | 1 refills | Status: DC

## 2022-03-08 MED ORDER — HYDRALAZINE HCL 25 MG PO TABS
50.0000 mg | ORAL_TABLET | Freq: Three times a day (TID) | ORAL | Status: DC
  Administered 2022-03-08: 50 mg via ORAL
  Filled 2022-03-08: qty 2

## 2022-03-08 MED ORDER — AMIODARONE HCL 400 MG PO TABS: 400.0000 mg | ORAL_TABLET | Freq: Two times a day (BID) | ORAL | 1 refills | Status: DC

## 2022-03-08 MED ORDER — PROPOFOL 500 MG/50ML IV EMUL
INTRAVENOUS | Status: DC | PRN
  Administered 2022-03-08: 150 ug/kg/min via INTRAVENOUS

## 2022-03-08 MED ORDER — LACTATED RINGERS IV SOLN: INTRAVENOUS | Status: DC

## 2022-03-08 MED ORDER — FLUCONAZOLE 100 MG PO TABS: 100.0000 mg | ORAL_TABLET | Freq: Every day | ORAL | 0 refills | Status: DC

## 2022-03-08 NOTE — Progress Notes (Signed)
Patient ID: [name], male   DOB: 03/22/1956, 66 y.o.   MRN: [id number] S: Feeling better. To have EGD today and possible esophageal dilation due to dysphagia.   O:BP (!) 160/50 (BP Location: Left Leg)   Pulse 67   Temp 98 F (36.7 C)   Resp 15   Ht '5\' 10"'$  (1.778 m)   Wt 82.5 kg   SpO2 94%   BMI 26.10 kg/m   Intake/Output Summary (Last 24 hours) at 03/08/2022 0852 Last data filed at 03/08/2022 0600 Gross per 24 hour  Intake 239.53 ml  Output 3600 ml  Net -3360.47 ml   Intake/Output: I/O last 3 completed shifts: In: 446.4 [P.O.:120; I.V.:326.4] Out: 3600 [Other:3600]  Intake/Output this shift:  No intake/output data recorded. Weight change:  Gen:NAD CVS: RRR Resp: CTA Abd: +BS, soft, NT/ND Ext: no edema, LUE AVF +T/B  Recent Labs  Lab 03/01/22 2123 03/02/22 0238 03/03/22 0612 03/03/22 1415 03/04/22 0336 03/06/22 0413 03/07/22 1654  NA 134* 135 135  --  136 134* 135  K 3.3* 3.7 3.5  --  3.9 3.6 4.1  CL 98 99 100  --  100 98 98  CO2 '24 23 24  '$ --  '23 25 24  '$ GLUCOSE 180* 194* 227*  --  151* 226* 237*  BUN 49* 51* 43*  --  61* 46* 63*  CREATININE 4.41* 4.76* 4.41*  --  5.60* 5.66* 7.06*  ALBUMIN  --  3.3* 3.3* 3.1*  --   --  2.8*  CALCIUM 8.0* 8.1* 8.2*  --  8.2* 8.2* 8.2*  PHOS  --  4.3 2.6  --   --   --  4.2  AST  --   --   --  15  --   --   --   ALT  --   --   --  21  --   --   --    Liver Function Tests: Recent Labs  Lab 03/03/22 0612 03/03/22 1415 03/07/22 1654  AST  --  15  --   ALT  --  21  --   ALKPHOS  --  93  --   BILITOT  --  0.7  --   PROT  --  5.9*  --   ALBUMIN 3.3* 3.1* 2.8*   No results for input(s): "LIPASE", "AMYLASE" in the last 168 hours. Recent Labs  Lab 03/03/22 1415  AMMONIA 32   CBC: Recent Labs  Lab 03/02/22 0238 03/03/22 0612 03/03/22 1415 03/04/22 0336 03/06/22 0413 03/07/22 1654  WBC 6.3 5.7  --  7.5 9.1 9.0  NEUTROABS  --   --   --  5.4  --   --   HGB 9.0* 9.4*   < > 8.5* 8.7* 8.9*  HCT 28.5* 29.4*   < >  26.8* 27.7* 28.0*  MCV 85.1 85.0  --  84.8 85.0 85.6  PLT 148* 154  --  152 192 244   < > = values in this interval not displayed.   Cardiac Enzymes: No results for input(s): "CKTOTAL", "CKMB", "CKMBINDEX", "TROPONINI" in the last 168 hours. CBG: Recent Labs  Lab 03/07/22 0722 03/07/22 1146 03/07/22 1959 03/07/22 2224 03/08/22 0702  GLUCAP 197* 162* 119* 255* 201*    Iron Studies: No results for input(s): "IRON", "TIBC", "TRANSFERRIN", "FERRITIN" in the last 72 hours. Studies/Results: No results found.  amiodarone  400 mg Oral BID   aspirin  325 mg Oral Daily   calcium acetate  667  mg Oral TID WC   Chlorhexidine Gluconate Cloth  6 each Topical Q0600   darbepoetin (ARANESP) injection - DIALYSIS  60 mcg Intravenous Q Fri-HD   doxazosin  4 mg Oral Daily   feeding supplement (NEPRO CARB STEADY)  237 mL Oral BID BM   hydrALAZINE  25 mg Oral Q8H   insulin aspart  0-5 Units Subcutaneous QHS   insulin aspart  0-9 Units Subcutaneous TID WC   isosorbide dinitrate  30 mg Oral TID   metoprolol succinate  100 mg Oral Daily   metoprolol tartrate  75 mg Oral BID   pantoprazole  40 mg Oral BID   sertraline  100 mg Oral Daily    BMET    Component Value Date/Time   NA 135 03/07/2022 1654   NA 138 10/07/2021 1202   K 4.1 03/07/2022 1654   CL 98 03/07/2022 1654   CO2 24 03/07/2022 1654   GLUCOSE 237 (H) 03/07/2022 1654   BUN 63 (H) 03/07/2022 1654   BUN 42 (H) 10/07/2021 1202   CREATININE 7.06 (H) 03/07/2022 1654   CREATININE 1.94 (H) 05/13/2019 1029   CALCIUM 8.2 (L) 03/07/2022 1654   GFRNONAA 8 (L) 03/07/2022 1654   GFRAA 35 (L) 03/10/2020 1332   CBC    Component Value Date/Time   WBC 9.0 03/07/2022 1654   RBC 3.27 (L) 03/07/2022 1654   HGB 8.9 (L) 03/07/2022 1654   HGB 9.0 (L) 11/01/2021 1439   HCT 28.0 (L) 03/07/2022 1654   HCT 28.1 (L) 11/01/2021 1439   PLT 244 03/07/2022 1654   PLT 254 11/01/2021 1439   MCV 85.6 03/07/2022 1654   MCV 88 11/01/2021 1439   MCH 27.2  03/07/2022 1654   MCHC 31.8 03/07/2022 1654   RDW 17.4 (H) 03/07/2022 1654   RDW 15.9 (H) 11/01/2021 1439   LYMPHSABS 1.0 03/04/2022 0336   LYMPHSABS 0.9 10/07/2021 1202   MONOABS 1.0 03/04/2022 0336   EOSABS 0.1 03/04/2022 0336   EOSABS 0.2 10/07/2021 1202   BASOSABS 0.0 03/04/2022 0336   BASOSABS 0.1 10/07/2021 1202   Outpatient Dialysis Orders: MWF [facility] 4h  300/600  80kg  3k/2.5Ca bath  Heparin none  RIJ TDC (maturing LUA AVF created on 12/21/21) - last hep B labs: none - mircera 30 ug q2  - iron sucrose '100mg'$  q hd IV thru 8/21 (finished) - calcitriol 0.25 ug tiw po - last HD 9/18, post wt 82.7 kg (signed off 2 hours early on 9/18 and 1:45 early on 9/15)   Assessment/Plan:  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  Large right pleural effusion, s/p thoracentesis 03/01/22 with improved WOB.  Markedly improved with HD and UF.  Dysphagia - history of esophageal adenocarcinoma (diagnosed in 2022 and poor surgical candidate, s/p EMR, cryotherapy, and RFA).  For EGD today and possible esophageal dilation.   PSVT - cardiology following and to change coreg to lopressor for better rate control.  Started on IV amiodarone and now on po.   ESRD -  had short session of HD 9/19 with 1.8L UF but followed by SVT and hypotension and give IV bolus of 500 mL.  Plan to continue with HD on MWF schedule.  Will use 4K and follow.   Hypertension/volume  - as above, serial HD with UF as tolerated.  Anemia  - continue to follow H/H, redose ESA as needed.  Metabolic bone disease -  continue with home meds  Nutrition - renal diet, carb modified  Large right pleural effusion - s/p thoracentesis of 1.3 liters, clear dark yellow fluid removed 03/01/22.    Disposition - hopefully can be discharged after EGD today and go to outpatient HD tomorrow.  If not will plan for HD here in am.   Donetta Potts, MD [facility]

## 2022-03-08 NOTE — Progress Notes (Signed)
Pt ready for discharge. Discharge instructions reviewed with pt and wife, both state understanding. Pt ambulatory in room with only standby assist, assisted to put on clothing by his wife without difficulty or issue. Pt ate 100% of supper tray provided without difficulty. IV removed and pt discharged via WC to POV with all personal belongings in his possession.

## 2022-03-08 NOTE — Anesthesia Preprocedure Evaluation (Signed)
Anesthesia Evaluation  Patient identified by MRN, date of birth, ID band Patient awake    Reviewed: Allergy & Precautions, H&P , NPO status , Patient's Chart, lab work & pertinent test results, reviewed documented beta blocker date and time   Airway Mallampati: II  TM Distance: >3 FB Neck ROM: full    Dental no notable dental hx.    Pulmonary sleep apnea , former smoker,    Pulmonary exam normal breath sounds clear to auscultation       Cardiovascular Exercise Tolerance: Good hypertension, + CAD, + Past MI and +CHF   Rhythm:regular Rate:Normal     Neuro/Psych  Neuromuscular disease CVA negative psych ROS   GI/Hepatic Neg liver ROS, GERD  Medicated,  Endo/Other  negative endocrine ROSdiabetes, Type 2  Renal/GU ESRF and DialysisRenal disease  negative genitourinary   Musculoskeletal   Abdominal   Peds  Hematology  (+) Blood dyscrasia, anemia ,   Anesthesia Other Findings 1. Left ventricular ejection fraction, by estimation, is 40 to 45%. The  left ventricle has mildly decreased function. The left ventricle  demonstrates global hypokinesis. There is moderate left ventricular  hypertrophy. Left ventricular diastolic  parameters are indeterminate.  2. Ventricular septum is flattened in systole consistent with RV pressure  overload. . Right ventricular systolic function is moderately reduced. The  right ventricular size is severely enlarged. There is mildly elevated  pulmonary artery systolic  pressure.  3. Left atrial size was severely dilated.  4. Catheter tip noted in RA. Right atrial size was severely dilated.  5. The mitral valve is normal in structure. No evidence of mitral valve  regurgitation. No evidence of mitral stenosis.  6. The tricuspid valve is abnormal.  7. The aortic valve is tricuspid. There is mild calcification of the  aortic valve. There is mild thickening of the aortic valve. Aortic valve   regurgitation is not visualized. No aortic stenosis is present.  8. The inferior vena cava is dilated in size with >50% respiratory  variability, suggesting right atrial pressure of 8 mmHg.   Reproductive/Obstetrics negative OB ROS                             Anesthesia Physical Anesthesia Plan  ASA: 3 and emergent  Anesthesia Plan: General   Post-op Pain Management:    Induction:   PONV Risk Score and Plan: Propofol infusion  Airway Management Planned:   Additional Equipment:   Intra-op Plan:   Post-operative Plan:   Informed Consent: I have reviewed the patients History and Physical, chart, labs and discussed the procedure including the risks, benefits and alternatives for the proposed anesthesia with the patient or authorized representative who has indicated his/her understanding and acceptance.     Dental Advisory Given  Plan Discussed with: CRNA  Anesthesia Plan Comments:         Anesthesia Quick Evaluation

## 2022-03-08 NOTE — Telephone Encounter (Signed)
Patient needs hospital follow-up in 2-4 weeks. Please arrange with any available APP ([facility] or [place] office). Previously a Dr. Laural Golden patient and prefers to stay as a Dr. Jenetta Downer patient moving forward.

## 2022-03-08 NOTE — Transfer of Care (Signed)
Immediate Anesthesia Transfer of Care Note  Patient: [name]  Procedure(s) Performed: ESOPHAGOGASTRODUODENOSCOPY (EGD) WITH PROPOFOL ESOPHAGEAL BRUSHING  Patient Location: PACU  Anesthesia Type:General  Level of Consciousness: awake, alert , oriented and patient cooperative  Airway & Oxygen Therapy: Patient Spontanous Breathing  Post-op Assessment: Report given to RN, Post -op Vital signs reviewed and stable and Patient moving all extremities X 4  Post vital signs: Reviewed and stable  Last Vitals:  Vitals Value Taken Time  BP    Temp    Pulse    Resp    SpO2      Last Pain:  Vitals:   03/08/22 1251  TempSrc:   PainSc: 0-No pain      Patients Stated Pain Goal: 8 (83/29/19 1660)  Complications: No notable events documented.

## 2022-03-08 NOTE — Progress Notes (Signed)
Progress Note  Patient Name: [name] Date of Encounter: 03/08/2022  Primary Cardiologist: Skeet Latch, MD  Subjective   Patient n.p.o. for EGD today.  No chest pain or palpitations.  Inpatient Medications    Scheduled Meds:  amiodarone  400 mg Oral BID   aspirin  325 mg Oral Daily   calcium acetate  667 mg Oral TID WC   Chlorhexidine Gluconate Cloth  6 each Topical Q0600   darbepoetin (ARANESP) injection - DIALYSIS  60 mcg Intravenous Q Fri-HD   doxazosin  4 mg Oral Daily   feeding supplement (NEPRO CARB STEADY)  237 mL Oral BID BM   hydrALAZINE  25 mg Oral Q8H   insulin aspart  0-5 Units Subcutaneous QHS   insulin aspart  0-9 Units Subcutaneous TID WC   isosorbide dinitrate  30 mg Oral TID   metoprolol succinate  100 mg Oral Daily   pantoprazole  40 mg Oral BID   sertraline  100 mg Oral Daily   Continuous Infusions:  sodium chloride 20 mL/hr at 03/07/22 2330   meropenem (MERREM) IV 500 mg (03/07/22 2333)   PRN Meds: hydrALAZINE, ondansetron **OR** ondansetron (ZOFRAN) IV, polyethylene glycol, prochlorperazine, traZODone   Vital Signs    Vitals:   03/07/22 2252 03/08/22 0453 03/08/22 0859 03/08/22 1038  BP: (!) 186/66 (!) 160/50 (!) 158/47 (!) 123/42  Pulse: 76 67 66 66  Resp: '15 15 16 20  '$ Temp: 99 F (37.2 C) 98 F (36.7 C) 98.6 F (37 C) 97.7 F (36.5 C)  TempSrc:   Oral Oral  SpO2: 95% 94% 94% 93%  Weight:      Height:        Intake/Output Summary (Last 24 hours) at 03/08/2022 1048 Last data filed at 03/08/2022 0600 Gross per 24 hour  Intake 239.53 ml  Output 3600 ml  Net -3360.47 ml   Filed Weights   03/04/22 1832 03/06/22 0631 03/07/22 0500  Weight: 81 kg 80.2 kg 82.5 kg    Telemetry    Sinus rhythm.  Personally reviewed.  ECG    An ECG dated 03/01/2022 was personally reviewed today and demonstrated:  Sinus rhythm with PAC, diffuse ST-T wave abnormalities.  Physical Exam   GEN: Chronically ill-appearing, no acute  distress.   Neck: No JVD. Cardiac: RRR without gallop. Respiratory: Nonlabored.  Decreased basilar breath sounds. GI: Soft, nontender, bowel sounds present. MS: No edema; No deformity. Neuro:  Nonfocal. Psych: Alert and oriented x 3. Normal affect.  Labs    Chemistry Recent Labs  Lab 03/03/22 0612 03/03/22 1415 03/04/22 0336 03/06/22 0413 03/07/22 1654  NA 135  --  136 134* 135  K 3.5  --  3.9 3.6 4.1  CL 100  --  100 98 98  CO2 24  --  '23 25 24  '$ GLUCOSE 227*  --  151* 226* 237*  BUN 43*  --  61* 46* 63*  CREATININE 4.41*  --  5.60* 5.66* 7.06*  CALCIUM 8.2*  --  8.2* 8.2* 8.2*  PROT  --  5.9*  --   --   --   ALBUMIN 3.3* 3.1*  --   --  2.8*  AST  --  15  --   --   --   ALT  --  21  --   --   --   ALKPHOS  --  93  --   --   --   BILITOT  --  0.7  --   --   --  GFRNONAA 14*  --  11* 10* 8*  ANIONGAP 11  --  '13 11 13     '$ Hematology Recent Labs  Lab 03/04/22 0336 03/06/22 0413 03/07/22 1654  WBC 7.5 9.1 9.0  RBC 3.16* 3.26* 3.27*  HGB 8.5* 8.7* 8.9*  HCT 26.8* 27.7* 28.0*  MCV 84.8 85.0 85.6  MCH 26.9 26.7 27.2  MCHC 31.7 31.4 31.8  RDW 17.5* 17.6* 17.4*  PLT 152 192 244    Cardiac Enzymes Recent Labs  Lab 02/28/22 1238 02/28/22 1532 03/01/22 0040  TROPONINIHS 51* 52* 53*    BNP No results for input(s): "BNP", "PROBNP" in the last 168 hours.    Radiology    No results found.  Cardiac Studies   Echocardiogram 03/01/2022:  1. Left ventricular ejection fraction, by estimation, is 40 to 45%. The  left ventricle has mildly decreased function. The left ventricle  demonstrates global hypokinesis. There is moderate left ventricular  hypertrophy. Left ventricular diastolic  parameters are indeterminate.   2. Ventricular septum is flattened in systole consistent with RV pressure  overload. . Right ventricular systolic function is moderately reduced. The  right ventricular size is severely enlarged. There is mildly elevated  pulmonary artery  systolic  pressure.   3. Left atrial size was severely dilated.   4. Catheter tip noted in RA. Right atrial size was severely dilated.   5. The mitral valve is normal in structure. No evidence of mitral valve  regurgitation. No evidence of mitral stenosis.   6. The tricuspid valve is abnormal.   7. The aortic valve is tricuspid. There is mild calcification of the  aortic valve. There is mild thickening of the aortic valve. Aortic valve  regurgitation is not visualized. No aortic stenosis is present.   8. The inferior vena cava is dilated in size with >50% respiratory  variability, suggesting right atrial pressure of 8 mmHg.   Assessment & Plan    1.  HFrEF with ischemic cardiomyopathy, LVEF 40 to 45% by recent assessment, also moderate RV dysfunction with at least mildly elevated estimated PASP.  Right pleural effusion at presentation now status post thoracentesis.  GDMT limited by comorbidities.  Therapy adjusted to Toprol-XL, hydralazine, and isosorbide dinitrate..  Volume status managed by HD.  2.  PSVT and NSVT.  Beta-blocker switched from Coreg to metoprolol and ultimately started on amiodarone for rhythm suppression per chart review.  3.  Multivessel CAD status post CABG.  Cardiac catheterization in August 2023 showed patent bypass grafts with plan for medical therapy.  4.  ESRD on hemodialysis.  5.  Esophageal adenocarcinoma, poor candidate for esophagectomy per chart review.  Status post EMR, cryotherapy, and RFA in April.  He follows at [facility].  Having recent issues with dysphagia, GI consultation in process and for EGD today.  6.  OSA with use of BiPAP.  7.  Metabolic encephalopathy.  Continue Toprol-XL, oral amiodarone load, and isosorbide dinitrate.  Will uptitrate hydralazine.  Continue aspirin at present dose as discussed yesterday.  For EGD today per gastroenterology.  We will continue to follow.  Signed, Rozann Lesches, MD  03/08/2022, 10:48 AM

## 2022-03-08 NOTE — Op Note (Signed)
[facility] Patient Name: [name] Procedure Date: 03/08/2022 12:44 PM MRN: [id number] Date of Birth: 02-May-1956 Attending MD: Elon Alas. Abbey Chatters DO CSN: [id number] Age: 66 Admit Type: Outpatient Procedure:                Upper GI endoscopy Indications:              Dysphagia Providers:                Elon Alas. Abbey Chatters, DO, Gwynneth Albright RN, RN,                            Caprice Kluver, Aram Candela, Everardo Pacific Referring MD:              Medicines:                See the Anesthesia note for documentation of the                            administered medications Complications:            No immediate complications. Estimated Blood Loss:     Estimated blood loss was minimal. Procedure:                Pre-Anesthesia Assessment:                           - The anesthesia plan was to use monitored                            anesthesia care (MAC).                           After obtaining informed consent, the endoscope was                            passed under direct vision. Throughout the                            procedure, the patient's blood pressure, pulse, and                            oxygen saturations were monitored continuously. The                            [id number]-H190 ([id number]) scope was introduced through the                            mouth, and advanced to the second part of duodenum.                            The upper GI endoscopy was accomplished without                            difficulty. The patient tolerated the procedure                            well. Scope  In: 12:56:58 PM Scope Out: 1:00:53 PM Total Procedure Duration: 0 hours 3 minutes 55 seconds  Findings:      A medium-sized hiatal hernia was present.      Moderately severe esophagitis with no bleeding was found in the lower       third of the esophagus. Cells for cytology were obtained by brushing.      The entire examined stomach was normal.      The duodenal bulb, first  portion of the duodenum and second portion of       the duodenum were normal. Impression:               - Medium-sized hiatal hernia.                           - Moderately severe candidiasis esophagitis with no                            bleeding. Cells for cytology obtained.                           - Normal stomach.                           - Normal duodenal bulb, first portion of the                            duodenum and second portion of the duodenum. Moderate Sedation:      Per Anesthesia Care Recommendation:           - Return patient to hospital ward for ongoing care.                           - Mechanical soft diet.                           - 14 day couorse Diflucan Procedure Code(s):        --- Professional ---                           [phone], Esophagogastroduodenoscopy, flexible,                            transoral; diagnostic, including collection of                            specimen(s) by brushing or washing, when performed                            (separate procedure) Diagnosis Code(s):        --- Professional ---                           K44.9, Diaphragmatic hernia without obstruction or                            gangrene  B37.81, Candidal esophagitis                           R13.10, Dysphagia, unspecified CPT copyright 2019 [employer]. All rights reserved. The codes documented in this report are preliminary and upon coder review may  be revised to meet current compliance requirements. Elon Alas. Abbey Chatters, DO [place] Abbey Chatters, DO 03/08/2022 1:04:10 PM This report has been signed electronically. Number of Addenda: 0

## 2022-03-08 NOTE — TOC Transition Note (Signed)
Transition of Care [facility]) - CM/SW Discharge Note   Patient Details  Name: [name] MRN: [id number] Date of Birth: 1955-08-15  [facility] CM/SW Contact:  Iona Beard, [place] Phone Number: 03/08/2022, 3:43 PM   Clinical Narrative:    CSW updated of plan for D/C home today. CSW updated Tommi Rumps with [facility] of plan for D/C. HH orders have been placed. CSW added HH information to AVS. TOC signing off.   Final next level of care: [place] Barriers to Discharge: Barriers Resolved   Patient Goals and CMS Choice Patient states their goals for this hospitalization and ongoing recovery are:: return home CMS Medicare.gov Compare Post Acute Care list provided to:: Patient Choice offered to / list presented to : Patient  Discharge Placement                       Discharge Plan and Services In-house Referral: Clinical Social Work   Post Acute Care Choice: Resumption of Svcs/PTA Provider                      [facility] Agency: [place] Date [place]: 03/08/22 Time [place]: 8828 Representative spoke with at [place]: [place] ([place]) Interventions     Readmission Risk Interventions    03/01/2022    8:14 AM 08/30/2021    2:40 PM 07/26/2021    8:24 AM  Readmission Risk Prevention Plan  Transportation Screening Complete Complete Complete  HRI or Home Care Consult   Complete  Social Work Consult for [place] Planning/Counseling   Complete  Palliative Care Screening   Not Applicable  Medication Review Press photographer) Complete Complete Complete  PCP or Specialist appointment within 3-5 days of discharge  Not Complete   HRI or [place] Complete Complete   SW Recovery Care/Counseling Consult Complete Complete   Palliative Care Screening Not Applicable Not [place] Not Applicable Not Applicable

## 2022-03-08 NOTE — Interval H&P Note (Signed)
History and Physical Interval Note:  03/08/2022 12:47 PM  [name]  has presented today for surgery, with the diagnosis of dysphagia, regurgitation.  The various methods of treatment have been discussed with the patient and family. After consideration of risks, benefits and other options for treatment, the patient has consented to  Procedure(s): ESOPHAGOGASTRODUODENOSCOPY (EGD) WITH PROPOFOL (N/A) ESOPHAGEAL DILATION (N/A) as a surgical intervention.  The patient's history has been reviewed, patient examined, no change in status, stable for surgery.  I have reviewed the patient's chart and labs.  Questions were answered to the patient's satisfaction.     Eloise Harman

## 2022-03-08 NOTE — Discharge Summary (Signed)
Physician Discharge Summary   Patient: [name] MRN: [id number] DOB: January 01, 1956  Admit date:     02/28/2022  Discharge date: 03/08/22  Discharge Physician: Shanon Brow Treniyah Lynn   PCP: [name], MD   Recommendations at discharge:   Please follow up with primary care provider within 1-2 weeks  Please repeat BMP and CBC in one week     Hospital Course: 66 y.o. male with medical history significant for CABG, ESRD, cardiomyopathy with systolic and diastolic CHF, stroke, hypertension, diabetes mellitus. Patient was brought to the ED from dialysis center with reports of worsening of his difficulty breathing.  At the time of my evaluation, patient is on BiPAP. Patient was having dialysis today and was 1 and 54 minutes into session when it had to be stopped.  He was having significant worsening of his breathing, with use of accessory muscle, was placed on 4 L nasal cannula, and BiPAP here. Patient reports has been having difficulty breathing over the past 2 days.  He reported cough productive of whitish phlegm.  On arrival to the ED there was some wheezing.   Recent hospitalization 8/19 to 8/25 for chest pain, transferred to [facility], due to history of previous CABG.  Underwent cardiac cath 8/22 which showed patent CABG.   ED Course: Temperature 98.9.  Initial heart rate 103 improved to 70s, respiratory rate initially up to 30 improved after BiPAP, blood pressure 120s to 170s.  O2 sats 90% on 4 L nasal cannula, subsequently switched to BiPAP due to tachycardia, tachypnea, accessory muscle use and increased work of breathing. Chest x-ray with mild cardiomegaly without overt pulmonary edema, small right pleural effusion slightly increased, hazy right lung base opacity favor atelectasis. VBG with pH of 7.3, PCO2 of 43.  Lactic acid 3.5.  Troponin 51.   IV ceftriaxone and azithromycin started. Initial concern for flash pulmonary edema, hence nitroglycerin drip initially started and later  discontinued with improvement in respiratory status. However, the patient was noted to have gram-negative bacteremia, and he was started on cefepime empirically.  On the morning of 03/03/2022, the patient was a little more somnolent and confused.  However, he woke up to voice and was able to answer questions appropriately when he was awake.  Work-up was undertaken to clarify etiology of his altered mental status.  Assessment and Plan: * Acute respiratory failure with hypoxia (HCC) Difficulty breathing over the past 2 days PTA -placed on BiPAP due to increased work of breathing.   -Respiratory status has improved after thora (1.3L removed).  Quit smoking cigarettes about 33 years ago. . -Started on BiPAP and nitroglycerin drip which was discontinued after improvement in respiratory status. -Hold off on further antibiotics at this time -Obtain CTA chest, I talked to nephrologist Dr. [name], okay to administer IV contrast at this time -Lactic acid 3.5 > 1.9 resolved without intervention, likely secondary to hypoxia, doubt infection. -Addendum-no PE, shows large right pleural effusion increased in size from prior.  Likely etiology for his hypoxia.  -ordered ultrasound-guided thoracentesis>>1.3L -now stable on RA  DM type 2 causing vascular disease (HCC) A1c 7.7.  Glucose 176. - continue SSI -CBGs largely controlled  Essential hypertension, benign Systolic 102H to 852D. -Resume doxazosin, Imdur, metoprolol  -hydralazine restarted by cardiology team 9/19 -metoprolol changed to succinate 100 mg daily  Acute metabolic encephalopathy 7/82/4235 AM--patient is somnolent but arouses to voice and answering questions appropriately -CT brain--neg -Ammonia--32 -CBG 151 -Check VBG 7.5/35/59/27 RA B12--564 -Discontinue any hypnotic medications -May be  related to cefepime>>switch to merrem -mental status now improved and back to baseline  Acute on chronic combined systolic and diastolic CHF  (congestive heart failure) ([place]) 07/25/21 Echo--EF 40-45%, no WMA, mod RV dysfx, PASP 56.5 - BNP >4500, large right effusion on CT - transiently on NG gtt in setting of HTN now off.  - fluid management per HD 03/01/22 Echo--EF 40-45%, global HK, RV overload -03/02/2022 UF 1.8L 03/04/22-UF 2.2L 03/07/22--3.5L removed Volume overload status improved D/c torsemide--pt essentially anuric at this point Continue MWF schedule  SVT (supraventricular tachycardia) ([place]) --Pt found unresponsive in sustained SVT on 9/18, resolved after vagal maneuvers -- appreciate cardiology -continued to have SVT 9/20 evening, one episode 9/21 evening -one episode 9/22 evening lasting 8 sec -9/23-9/24>>>no SVT episodes -switched coreg to metoprolol -started amiodarone drip>>transitioned to po 03/06/22 -continue metoprolol 75 mg bid>>metoprolol succinate -overall improving - switch to po amio 9/24>>400 bid through 9/30, then 200 bid x 7 days, then 200 daily  Pleural effusion on right -- likely major contributor to his dyspneic symptoms -- 9/19 thora>>1.3L--appears exudative by Light's criteria -cytology negative -repeat attempt 03/04/22>> not enough fluid  Goals of care, counseling/discussion Confirmed with pt and spouse = DNR  Dysphagia Appears to be predominantly esophageal etiology Evaluated by speech>>dys1 with thin, adv as tolerated GI consult appreciated>>EGD 9/26>>, significant esophagitis looks like Candida  -GI advocated for empiric tx with fluconazole x 14 days -tolerating dys 3 diet at time of d/c  Hemoptysis 03/03/22 mod amounts Appreciate pulm consult Appears resolved May be related to aspiration/pulm HTN Monitor H/H -stable on RA  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 [name] --considered poor candidate for esophagectomy. After multidisciplinary discussion the plan was to continue with endoscopic treatments, and continue to follow-up with oncology for surveillance and  any signs of progression of disease -status post EMR, cryotherapy, and most recently RFA in April 2023 -02/10/22 PET scan--no local recurrence   DNR (do not resuscitate) --continue DNR order while in hospital   gram neg rod Bacteremia -E. [place] -Due to altered mental status, change antibiotics to merrem -repeat blood culture neg to date -cefepime>>merrem -d/c home with cefdinir x 3 days  ESRD on dialysis [facility]) Started dialysis within the past month.  Schedule Monday Wednesday Friday.  HD session terminated today due to sudden worsening of his dyspnea.  I talked with Dr. Colodonato>>, okay with IV contrast for now. -nephrology consulted to manage inpatient HD -03/02/22 UF 1.8L -03/04/22 UF 2.2L -HD 03/07/22   Obstructive sleep apnea BIPAP QHS while hospitalized.  S/P CABG (coronary artery bypass graft) Recent cardiac cath 8/22, showed patent grafts, severe three-vessel native CAD.Marland Kitchen  History of CABG 2020.  Troponin today 51 > 52, not consistent with ACS -Resume aspirin, metoprolol, Isordil   -No chest pain -03/01/2022 echo EF 40-45%, global HK, mild TR, RV overload         Consultants: GI, cardiology, renal Procedures performed: none  Disposition: Home Diet recommendation:  Cardiac diet DISCHARGE MEDICATION: Allergies as of 03/08/2022       Reactions   Bee Venom Anaphylaxis   Atorvastatin Other (See Comments)   myalgia   Diltiazem Itching   Reglan [metoclopramide] Other (See Comments)   Suicidal    Rosuvastatin Other (See Comments)   myalgias   Valsartan Itching        Medication List     STOP taking these medications    carvedilol 12.5 MG tablet Commonly known as: COREG   cholecalciferol 25 MCG (1000 UNIT)  tablet Commonly known as: VITAMIN D3   hydrOXYzine 25 MG capsule Commonly known as: VISTARIL   insulin aspart 100 UNIT/ML injection Commonly known as: novoLOG   isosorbide mononitrate 30 MG 24 hr tablet Commonly known as: IMDUR   metoprolol  tartrate 50 MG tablet Commonly known as: LOPRESSOR   NovoLOG FlexPen 100 UNIT/ML FlexPen Generic drug: insulin aspart   omeprazole 40 MG capsule Commonly known as: PRILOSEC   ondansetron 4 MG disintegrating tablet Commonly known as: ZOFRAN-ODT   ondansetron 4 MG tablet Commonly known as: ZOFRAN   oxyCODONE-acetaminophen 5-325 MG tablet Commonly known as: PERCOCET/ROXICET   torsemide 20 MG tablet Commonly known as: DEMADEX       TAKE these medications    acetaminophen 325 MG tablet Commonly known as: TYLENOL Take 2 tablets (650 mg total) by mouth every 6 (six) hours as needed for mild pain, moderate pain or fever.   albuterol 108 (90 Base) MCG/ACT inhaler Commonly known as: VENTOLIN HFA Inhale 2 puffs into the lungs every 6 (six) hours as needed. What changed: reasons to take this   amiodarone 400 MG tablet Commonly known as: PACERONE Take 1 tablet (400 mg total) by mouth 2 (two) times daily. Through 03/12/22.  Then 200 mg BID x 7 days starting 03/13/22.  Then 200 mg once daily starting 03/20/22   ARIPiprazole 5 MG tablet Commonly known as: ABILIFY Take 5 mg by mouth daily.   ascorbic acid 500 MG tablet Commonly known as: VITAMIN C Take 1 tablet (500 mg total) by mouth daily.   Aspercreme Lidocaine 4 % Generic drug: lidocaine Place 1 patch onto the skin daily. Wear for 12 hours then remove, wait 12 hours before applying a new patch to lower back   aspirin EC 325 MG tablet Take 325 mg by mouth daily.   B-D ULTRAFINE III SHORT PEN 31G X 8 MM Misc Generic drug: Insulin Pen Needle 1 each by Does not apply route as directed.   calcium acetate 667 MG capsule Commonly known as: PHOSLO Take 667 mg by mouth in the morning, at noon, and at bedtime.   calcium-vitamin D 500-5 MG-MCG tablet Commonly known as: OSCAL WITH D Take 1 tablet by mouth 2 (two) times daily.   cefdinir 300 MG capsule Commonly known as: OMNICEF Take 1 capsule (300 mg total) by mouth  daily. Start taking on: March 09, 2022   doxazosin 4 MG tablet Commonly known as: CARDURA Take 1 tablet (4 mg total) by mouth daily.   EPINEPHrine 0.3 mg/0.3 mL Soaj injection Commonly known as: EPI-PEN Inject 0.3 mg into the muscle as needed for anaphylaxis.   feeding supplement (NEPRO CARB STEADY) Liqd Take 237 mLs by mouth 2 (two) times daily with a meal.   fluconazole 100 MG tablet Commonly known as: DIFLUCAN Take 1 tablet (100 mg total) by mouth daily. Start taking on: March 09, 2022   hydrALAZINE 50 MG tablet Commonly known as: APRESOLINE Take 1 tablet (50 mg total) by mouth every 8 (eight) hours. What changed:  medication strength how much to take when to take this   isosorbide dinitrate 30 MG tablet Commonly known as: ISORDIL Take 30 mg by mouth 3 (three) times daily.   metoprolol succinate 100 MG 24 hr tablet Commonly known as: TOPROL-XL Take 1 tablet (100 mg total) by mouth daily. Take with or immediately following a meal.   nitroGLYCERIN 0.4 MG SL tablet Commonly known as: NITROSTAT Place 1 tablet (0.4 mg total) under the tongue every  5 (five) minutes as needed for chest pain.   pantoprazole 40 MG tablet Commonly known as: PROTONIX Take 1 tablet (40 mg total) by mouth 2 (two) times daily.   Repatha SureClick 846 MG/ML Soaj Generic drug: Evolocumab Inject 140 mg as directed every 14 (fourteen) days. What changed: additional instructions   sertraline 100 MG tablet Commonly known as: ZOLOFT 1 daily   Theratrum Complete Tabs Take 1 tablet by mouth daily.   traZODone 50 MG tablet Commonly known as: DESYREL Take 0.5-1 tablets (25-50 mg total) by mouth at bedtime as needed for sleep.        Follow-up Information     [facility] up.   Specialty: Home Health Services Why: Home health agency will call to set up appointments. Contact information: [place] [address] [phone]                 Discharge Exam: Filed Weights   03/06/22 0631 03/07/22 0500 03/08/22 1206  Weight: 80.2 kg 82.5 kg 82.5 kg   HEENT:  [place]/AT, No thrush, no icterus CV:  RRR, no rub, no S3, no S4 Lung:  CTA, no wheeze, no rhonchi Abd:  soft/+BS, NT Ext:  No edema, no lymphangitis, no synovitis, no rash   Condition at discharge: stable  The results of significant diagnostics from this hospitalization (including imaging, microbiology, ancillary and laboratory) are listed below for reference.   Imaging Studies: LONG TERM MONITOR-LIVE TELEMETRY (3-14 DAYS)  Result Date: 03/06/2022 14 Day Zio Monitor Quality: Fair.  Baseline artifact. Predominant rhythm: Sinus rhythm Average heart rate: 71 bpm Max heart rate: 94 bpm Min heart rate: 57 bpm Pauses >2.5 seconds: none Up to 19 beat runs of NSVT.  Maximum rate 160 bpm 17 SVT runs lasting up to 46 seconds.  Maximum rate 171 bpm Rare PACs Occasional PVCs (1.2%).  Rare ventricular couplets and triplets Occasional ventricular bigeminy and trigeminy Tiffany C. Oval Linsey, MD, [facility] 03/06/2022 1:23 PM  [place] CHEST (PLEURAL EFFUSION)  Result Date: 03/04/2022 CLINICAL DATA:  Pleural effusion EXAM: CHEST ULTRASOUND COMPARISON:  Radiograph 03/03/2022 FINDINGS: There is a small right pleural effusion. IMPRESSION: Small right pleural effusion. Electronically Signed   By: Maurine Simmering M.D.   On: 03/04/2022 09:29   DG CHEST PORT 1 VIEW  Result Date: 03/03/2022 CLINICAL DATA:  Coughing up blood today EXAM: PORTABLE CHEST 1 VIEW COMPARISON:  Radiograph 03/01/2022 FINDINGS: Unchanged mild cardiomegaly. Prior median sternotomy and postsurgical changes of CABG. Right neck catheter tip overlies the right atrium. Central pulmonary vascular congestion. Stable small right pleural effusion. No evidence of pneumothorax. Bones are unchanged. IMPRESSION: Mild cardiomegaly and central pulmonary vascular congestion. Stable small right pleural effusion.  Bibasilar atelectasis. Electronically  Signed   By: Maurine Simmering M.D.   On: 03/03/2022 16:41   CT ABDOMEN PELVIS WO CONTRAST  Result Date: 03/03/2022 CLINICAL DATA:  Abdominal pain, acute, nonlocalized. Recent thoracentesis. Esophageal cancer. EXAM: CT ABDOMEN AND PELVIS WITHOUT CONTRAST TECHNIQUE: Multidetector CT imaging of the abdomen and pelvis was performed following the standard protocol without IV contrast. RADIATION DOSE REDUCTION: This exam was performed according to the departmental dose-optimization program which includes automated exposure control, adjustment of the mA and/or kV according to patient size and/or use of iterative reconstruction technique. COMPARISON:  CT of the abdomen and pelvis 01/23/2022. PET scan 02/10/2022. FINDINGS: Lower chest: Moderate size right pleural effusion is present. Mild dependent atelectasis is associated. There is no scratched at no pneumothorax is  present. Dense coronary artery calcifications are present. No significant pericardial effusion is present. Hepatobiliary: No focal liver abnormality is seen. No gallstones, gallbladder wall thickening, or biliary dilatation. Pancreas: Unremarkable. No pancreatic ductal dilatation or surrounding inflammatory changes. Spleen: Normal in size without focal abnormality. Adrenals/Urinary Tract: Adrenal glands are within normal limits bilaterally. Stable bilateral simple cysts are present. Chronic stranding about both kidneys is stable. No other mass lesion or stone is present. No obstruction is present. Ureters are within normal limits. The urinary bladder is within normal limits. Stomach/Bowel: Stomach and duodenum are within normal limits. Small bowel is unremarkable. Terminal ileum is normal. The appendix is visualized and within normal limits. Loops of small bowel are present in the anterior right abdomen, likely representing adhesions. The ascending and transverse colon are within normal limits. Descending and sigmoid colon are normal. Vascular/Lymphatic:  Atherosclerotic calcifications are present in the aorta and branch vessels. No aneurysm is present. Reproductive: Prostate measures 5.4 cm in transverse diameter. Other: No abdominal wall hernia or abnormality. No abdominopelvic ascites. Musculoskeletal: Vertebral body heights and alignment are normal. No focal osseous abnormalities are present. Bony pelvis is within normal limits. Hips are located and normal. IMPRESSION: 1. Moderate size right pleural effusion and associated atelectasis. 2. No acute or focal lesion to explain the patient's abdominal pain. 3. Loops of small bowel in the anterior right abdomen, likely representing adhesions. No obstruction is associated. 4. Coronary artery disease. 5. Aortic Atherosclerosis (ICD10-I70.0). Electronically Signed   By: San Morelle M.D.   On: 03/03/2022 13:43   CT HEAD WO CONTRAST (5MM)  Result Date: 03/03/2022 CLINICAL DATA:  Altered mental status. EXAM: CT HEAD WITHOUT CONTRAST TECHNIQUE: Contiguous axial images were obtained from the base of the skull through the vertex without intravenous contrast. RADIATION DOSE REDUCTION: This exam was performed according to the departmental dose-optimization program which includes automated exposure control, adjustment of the mA and/or kV according to patient size and/or use of iterative reconstruction technique. COMPARISON:  None Available. FINDINGS: Brain: Mild chronic ischemic white matter disease. No mass effect or midline shift is noted. Ventricular size is within normal limits. There is no evidence of mass lesion, hemorrhage or acute infarction. Vascular: No hyperdense vessel or unexpected calcification. Skull: Normal. Negative for fracture or focal lesion. Sinuses/Orbits: No acute finding. Other: None. IMPRESSION: No acute intracranial abnormality seen. Electronically Signed   By: Marijo Conception M.D.   On: 03/03/2022 13:31   ECHOCARDIOGRAM COMPLETE  Result Date: 03/01/2022    ECHOCARDIOGRAM REPORT    Patient Name:   [name] Date of Exam: 03/01/2022 Medical Rec #:  [id number]        Height:       70.0 in Accession #:    [id number]       Weight:       181.4 lb Date of Birth:  April 03, 1956        BSA:          2.003 m Patient Age:    89 years         BP:           131/55 mmHg Patient Gender: M                HR:           69 bpm. Exam Location:  [facility] Procedure: 2D Echo, Cardiac Doppler and Color Doppler Indications:    Dyspnea  History:        Patient has prior history  of Echocardiogram examinations, most                 recent 07/25/2021. CHF and Cardiomyopathy, CAD, Prior CABG,                 Stroke and PAD, Arrythmias:Tachycardia, Signs/Symptoms:Shortness                 of Breath, Edema and Chest Pain; Risk Factors:Hypertension,                 Diabetes and Dyslipidemia.  Sonographer:    Wenda Low Referring Phys: [id number] [place]  1. Left ventricular ejection fraction, by estimation, is 40 to 45%. The left ventricle has mildly decreased function. The left ventricle demonstrates global hypokinesis. There is moderate left ventricular hypertrophy. Left ventricular diastolic parameters are indeterminate.  2. Ventricular septum is flattened in systole consistent with RV pressure overload. . Right ventricular systolic function is moderately reduced. The right ventricular size is severely enlarged. There is mildly elevated pulmonary artery systolic pressure.  3. Left atrial size was severely dilated.  4. Catheter tip noted in RA. Right atrial size was severely dilated.  5. The mitral valve is normal in structure. No evidence of mitral valve regurgitation. No evidence of mitral stenosis.  6. The tricuspid valve is abnormal.  7. The aortic valve is tricuspid. There is mild calcification of the aortic valve. There is mild thickening of the aortic valve. Aortic valve regurgitation is not visualized. No aortic stenosis is present.  8. The inferior vena cava is dilated in size with  >50% respiratory variability, suggesting right atrial pressure of 8 mmHg. FINDINGS  Left Ventricle: Left ventricular ejection fraction, by estimation, is 40 to 45%. The left ventricle has mildly decreased function. The left ventricle demonstrates global hypokinesis. The left ventricular internal cavity size was normal in size. There is  moderate left ventricular hypertrophy. Left ventricular diastolic parameters are indeterminate. Right Ventricle: Ventricular septum is flattened in systole consistent with RV pressure overload. The right ventricular size is severely enlarged. Right vetricular wall thickness was not well visualized. Right ventricular systolic function is moderately reduced. There is mildly elevated pulmonary artery systolic pressure. The tricuspid regurgitant velocity is 2.91 m/s, and with an assumed right atrial pressure of 8 mmHg, the estimated right ventricular systolic pressure is 09.2 mmHg. Left Atrium: Left atrial size was severely dilated. Right Atrium: Catheter tip noted in RA. Right atrial size was severely dilated. Pericardium: There is no evidence of pericardial effusion. Mitral Valve: The mitral valve is normal in structure. No evidence of mitral valve regurgitation. No evidence of mitral valve stenosis. MV peak gradient, 4.2 mmHg. The mean mitral valve gradient is 2.0 mmHg. Tricuspid Valve: The tricuspid valve is abnormal. Tricuspid valve regurgitation is mild . No evidence of tricuspid stenosis. Aortic Valve: The aortic valve is tricuspid. There is mild calcification of the aortic valve. There is mild thickening of the aortic valve. There is mild aortic valve annular calcification. Aortic valve regurgitation is not visualized. No aortic stenosis  is present. Aortic valve mean gradient measures 3.5 mmHg. Aortic valve peak gradient measures 7.2 mmHg. Aortic valve area, by VTI measures 2.94 cm. Pulmonic Valve: The pulmonic valve was not well visualized. Pulmonic valve regurgitation is not  visualized. No evidence of pulmonic stenosis. Aorta: The aortic root is normal in size and structure. Venous: The inferior vena cava is dilated in size with greater than 50% respiratory variability, suggesting right atrial pressure of 8 mmHg.  IAS/Shunts: No atrial level shunt detected by color flow Doppler.  LEFT VENTRICLE PLAX 2D LVIDd:         5.40 cm      Diastology LVIDs:         4.30 cm      LV e' medial:    5.87 cm/s LV PW:         1.40 cm      LV E/e' medial:  14.5 LV IVS:        1.20 cm      LV e' lateral:   11.70 cm/s LVOT diam:     2.10 cm      LV E/e' lateral: 7.3 LV SV:         81 LV SV Index:   41 LVOT Area:     3.46 cm  LV Volumes (MOD) LV vol d, MOD A2C: 118.0 ml LV vol d, MOD A4C: 98.3 ml LV vol s, MOD A2C: 85.2 ml LV vol s, MOD A4C: 48.6 ml LV SV MOD A2C:     32.8 ml LV SV MOD A4C:     98.3 ml LV SV MOD BP:      39.3 ml RIGHT VENTRICLE RV Basal diam:  4.80 cm RV Mid diam:    3.50 cm RV S prime:     10.10 cm/s TAPSE (M-mode): 1.7 cm LEFT ATRIUM              Index        RIGHT ATRIUM           Index LA diam:        5.80 cm  2.90 cm/m   RA Area:     26.40 cm LA Vol (A2C):   140.0 ml 69.91 ml/m  RA Volume:   84.40 ml  42.14 ml/m LA Vol (A4C):   91.4 ml  45.64 ml/m LA Biplane Vol: 114.0 ml 56.92 ml/m  AORTIC VALVE                    PULMONIC VALVE AV Area (Vmax):    2.66 cm     PV Vmax:       0.87 m/s AV Area (Vmean):   2.71 cm     PV Peak grad:  3.0 mmHg AV Area (VTI):     2.94 cm AV Vmax:           134.00 cm/s AV Vmean:          88.233 cm/s AV VTI:            0.277 m AV Peak Grad:      7.2 mmHg AV Mean Grad:      3.5 mmHg LVOT Vmax:         103.00 cm/s LVOT Vmean:        69.000 cm/s LVOT VTI:          0.235 m LVOT/AV VTI ratio: 0.85  AORTA Ao Root diam: 3.90 cm Ao Asc diam:  3.40 cm MITRAL VALVE               TRICUSPID VALVE MV Area (PHT): 3.05 cm    TR Peak grad:   33.9 mmHg MV Area VTI:   2.56 cm    TR Vmax:        291.00 cm/s MV Peak grad:  4.2 mmHg MV Mean grad:  2.0 mmHg    SHUNTS MV  Vmax:       1.02 m/s  Systemic VTI:  0.24 m MV Vmean:      61.2 cm/s   Systemic Diam: 2.10 cm MV Decel Time: 249 msec MV E velocity: 85.30 cm/s MV A velocity: 75.80 cm/s MV E/A ratio:  1.13 Carlyle Dolly MD Electronically signed by Carlyle Dolly MD Signature Date/Time: 03/01/2022/1:08:36 PM    Final    DG Chest 1 View  Result Date: 03/01/2022 CLINICAL DATA:  Status post right thoracentesis. EXAM: CHEST  1 VIEW COMPARISON:  CT chest and chest x-ray from yesterday. FINDINGS: Unchanged tunneled right internal jugular dialysis catheter. Unchanged mild cardiomegaly status post CABG. Decreased now trace small right pleural effusion. No pneumothorax. Improved aeration of the right lower lobe. No acute osseous abnormality. IMPRESSION: 1. Decreased right pleural effusion status post thoracentesis. No pneumothorax. Electronically Signed   By: Titus Dubin M.D.   On: 03/01/2022 11:26   US THORACENTESIS ASP PLEURAL SPACE W/IMG GUIDE  Result Date: 03/01/2022 INDICATION: Right pleural effusion. EXAM: ULTRASOUND GUIDED RIGHT THORACENTESIS MEDICATIONS: None. COMPLICATIONS: None immediate. PROCEDURE: An ultrasound guided thoracentesis was thoroughly discussed with the patient and questions answered. The benefits, risks, alternatives and complications were also discussed. The patient understands and wishes to proceed with the procedure. Written consent was obtained. Ultrasound was performed to localize and mark an adequate pocket of fluid in the right chest. The area was then prepped and draped in the normal sterile fashion. 1% Lidocaine was used for local anesthesia. Under ultrasound guidance a 6 Fr Safe-T-Centesis catheter was introduced. Thoracentesis was performed. The catheter was removed and a dressing applied. FINDINGS: A total of approximately 1.3 L of dark yellow fluid was removed. Samples were sent to the laboratory as requested by the clinical team. IMPRESSION: Successful ultrasound guided right  thoracentesis yielding 1.3 L of pleural fluid. Electronically Signed   By: Titus Dubin M.D.   On: 03/01/2022 11:23   CT Angio Chest Pulmonary Embolism (PE) W or WO Contrast  Result Date: 02/28/2022 CLINICAL DATA:  Acute respiratory failure, cough, shortness of breath, hypoxia EXAM: CT ANGIOGRAPHY CHEST WITH CONTRAST TECHNIQUE: Multidetector CT imaging of the chest was performed using the standard protocol during bolus administration of intravenous contrast. Multiplanar CT image reconstructions and MIPs were obtained to evaluate the vascular anatomy. RADIATION DOSE REDUCTION: This exam was performed according to the departmental dose-optimization program which includes automated exposure control, adjustment of the mA and/or kV according to patient size and/or use of iterative reconstruction technique. CONTRAST:  22m OMNIPAQUE IOHEXOL 350 MG/ML SOLN COMPARISON:  01/23/2022 FINDINGS: Cardiovascular: There is homogeneous enhancement in thoracic aorta. Coronary artery calcifications are seen. There is previous coronary artery bypass surgery. There is ectasia of main pulmonary artery measuring 4.1 cm suggesting pulmonary arterial hypertension. There are no intraluminal filling defects in central pulmonary artery branches. Evaluation of small peripheral pulmonary artery branches is limited by motion artifacts and atelectasis. Heart is enlarged in size. There is reflux of contrast into hepatic veins suggesting tricuspid incompetence. Tip of right IJ central venous catheter is seen in the region of right atrium. Mediastinum/Nodes: No new significant lymphadenopathy seen. Lungs/Pleura: There is large right pleural effusion with significant interval increase. Density measurements in the right pleural effusion are less than 20 Hounsfield units. There is minimal left pleural effusion. There is compression atelectasis in right lower lobe and to a lesser extent in right middle lobe and right upper lobe. No focal  infiltrates are seen in left lung. There is no pneumothorax. Upper Abdomen: There is fatty infiltration in the liver.  Spleen is enlarged measuring 15.4 cm in AP diameter. Multiple diverticula are seen in visualized portions of colon. Musculoskeletal: Deformity in the right tenth rib may suggest old ununited fracture. Review of the MIP images confirms the above findings. IMPRESSION: There is no evidence of central pulmonary artery embolism. Evaluation of small peripheral pulmonary artery branches is limited by motion artifacts. There is ectasia of main pulmonary artery suggesting pulmonary arterial hypertension. Coronary artery disease. Previous coronary artery bypass surgery. Cardiomegaly. Tricuspid incompetence. There is large right pleural effusion with significant interval increase in size. Compression atelectasis is seen in right lung, especially in right lower lobe. There is minimal left pleural effusion. Fatty liver.  Enlarged spleen.  Diverticulosis of colon. Other findings as described in the body of the report. Electronically Signed   By: Elmer Picker M.D.   On: 02/28/2022 18:07   DG Chest Port 1 View  Result Date: 02/28/2022 CLINICAL DATA:  Chest pain and dyspnea EXAM: PORTABLE CHEST 1 VIEW COMPARISON:  01/29/2022 chest radiograph. FINDINGS: Intact sternotomy wires. Pacer pad overlies the left chest. Right internal jugular central venous catheter terminates over the cavoatrial junction. Stable cardiomediastinal silhouette with mild cardiomegaly. No pneumothorax. Small right pleural effusion is slightly increased. No left pleural effusion. Slightly low lung volumes, similar. Cephalization of the pulmonary vasculature without overt pulmonary edema. Mild hazy right lung base opacity is mildly increased. IMPRESSION: 1. Stable mild cardiomegaly without overt pulmonary edema. 2. Small right pleural effusion, slightly increased. 3. Mildly increased hazy right lung base opacity, favor atelectasis.  Electronically Signed   By: Ilona Sorrel M.D.   On: 02/28/2022 13:40   NM PET Image Restage (PS) Skull Base to Thigh (F-18 FDG)  Result Date: 02/11/2022 CLINICAL DATA:  Subsequent treatment strategy for esophageal carcinoma. EXAM: NUCLEAR MEDICINE PET SKULL BASE TO THIGH TECHNIQUE: 9.91 mCi F-18 FDG was injected intravenously. Full-ring PET imaging was performed from the skull base to thigh after the radiotracer. CT data was obtained and used for attenuation correction and anatomic localization. Fasting blood glucose: 228 mg/dl COMPARISON:  07/15/2021 FINDINGS: Mediastinal blood pool activity: SUV max 2.22 Liver activity: SUV max NA NECK: No hypermetabolic lymph nodes in the neck. Incidental CT findings: None CHEST: No tracer avid supraclavicular, axillary, mediastinal, or hilar lymph nodes. No abnormal hypermetabolism associated with the esophagus. No tracer avid pulmonary nodules. Small to moderate right pleural effusion is again noted. Incidental CT findings: Cardiac enlargement. Aortic atherosclerosis. Status post CABG. ABDOMEN/PELVIS: No abnormal hypermetabolic activity within the liver, pancreas, adrenal glands, or spleen. No hypermetabolic lymph nodes in the abdomen or pelvis. Incidental CT findings: Bilateral fluid density kidney cysts are again noted compatible with simple cysts. No follow-up imaging recommended. Similar appearance of nonspecific bilateral perinephric fat stranding. Decrease abdominal ascites. Sigmoid diverticulosis without signs of acute diverticulitis. SKELETON: No focal hypermetabolic activity to suggest skeletal metastasis. Incidental CT findings: Subacute to chronic scratch set healing right posterior rib fractures are again noted. Chronic right anterior rib fracture deformities are also again noted. IMPRESSION: 1. There are no signs to suggest residual or recurrent FDG avid tumor within the esophagus. No FDG avid adenopathy or signs of solid organ metastasis. 2. Persistent right  pleural effusion.  Decrease in ascites. 3. Cardiac enlargement. Status post CABG. Aortic Atherosclerosis (ICD10-I70.0). Electronically Signed   By: Kerby Moors M.D.   On: 02/11/2022 07:47    Microbiology: Results for orders placed or performed during the hospital encounter of 02/28/22  Culture, blood (Routine x 2)  Status: None   Collection Time: 02/28/22 12:38 PM   Specimen: BLOOD RIGHT FOREARM  Result Value Ref Range Status   Specimen Description   Final    BLOOD RIGHT FOREARM BOTTLES DRAWN AEROBIC AND ANAEROBIC   Special Requests Blood Culture adequate volume  Final   Culture   Final    NO GROWTH 5 DAYS Performed at [facility], [address] [place], [place] [address]    Report Status 03/05/2022 FINAL  Final  Culture, blood (Routine x 2)     Status: Abnormal   Collection Time: 02/28/22 12:43 PM   Specimen: Right Antecubital; Blood  Result Value Ref Range Status   Specimen Description   Final    RIGHT ANTECUBITAL BOTTLES DRAWN AEROBIC AND ANAEROBIC Performed at [facility], [address] [place], [place] [address]    Special Requests   Final    Blood Culture adequate volume Performed at [facility], [address], [place], [place] [address]    Culture  Setup Time   Final    GRAM NEGATIVE RODS Gram Stain Report Called to,Read Back By and Verified With: L HILTON RN [phone] K FORSYTH IN BOTH AEROBIC AND ANAEROBIC BOTTLES CRITICAL RESULT CALLED TO, READ BACK BY AND VERIFIED WITH: Cloyde Reams [id number] '@1700'$  FH Performed at [facility], [address] [place], [place] [address]    Culture ENTEROBACTER CLOACAE (A)  Final   Report Status 03/03/2022 FINAL  Final   Organism ID, Bacteria ENTEROBACTER CLOACAE  Final      Susceptibility   Enterobacter cloacae - MIC*    CEFAZOLIN >=64 RESISTANT Resistant     CEFEPIME <=0.12 SENSITIVE Sensitive     CEFTAZIDIME <=1 SENSITIVE Sensitive     CIPROFLOXACIN <=0.25 SENSITIVE Sensitive     GENTAMICIN <=1 SENSITIVE Sensitive      IMIPENEM 0.5 SENSITIVE Sensitive     TRIMETH/SULFA <=20 SENSITIVE Sensitive     PIP/TAZO <=4 SENSITIVE Sensitive     * ENTEROBACTER CLOACAE  Blood Culture ID Panel (Reflexed)     Status: Abnormal   Collection Time: 02/28/22 12:43 PM  Result Value Ref Range Status   Enterococcus faecalis NOT DETECTED NOT DETECTED Final   Enterococcus Faecium NOT DETECTED NOT DETECTED Final   Listeria monocytogenes NOT DETECTED NOT DETECTED Final   Staphylococcus species NOT DETECTED NOT DETECTED Final   Staphylococcus aureus (BCID) NOT DETECTED NOT DETECTED Final   Staphylococcus epidermidis NOT DETECTED NOT DETECTED Final   Staphylococcus lugdunensis NOT DETECTED NOT DETECTED Final   Streptococcus species NOT DETECTED NOT DETECTED Final   Streptococcus agalactiae NOT DETECTED NOT DETECTED Final   Streptococcus pneumoniae NOT DETECTED NOT DETECTED Final   Streptococcus pyogenes NOT DETECTED NOT DETECTED Final   A.calcoaceticus-baumannii NOT DETECTED NOT DETECTED Final   Bacteroides fragilis NOT DETECTED NOT DETECTED Final   Enterobacterales DETECTED (A) NOT DETECTED Final    Comment: Enterobacterales represent a large order of gram negative bacteria, not a single organism. CRITICAL RESULT CALLED TO, READ BACK BY AND VERIFIED WITH: PHARMD S. HURTH [id number] '@1700'$  FH    Enterobacter cloacae complex DETECTED (A) NOT DETECTED Final    Comment: CRITICAL RESULT CALLED TO, READ BACK BY AND VERIFIED WITH: PHARMD S. HURTH [id number] '@1700'$  FH    Escherichia coli NOT DETECTED NOT DETECTED Final   Klebsiella aerogenes NOT DETECTED NOT DETECTED Final   Klebsiella oxytoca NOT DETECTED NOT DETECTED Final   Klebsiella pneumoniae NOT DETECTED NOT DETECTED Final   Proteus species NOT DETECTED NOT  DETECTED Final   Salmonella species NOT DETECTED NOT DETECTED Final   Serratia marcescens NOT DETECTED NOT DETECTED Final   Haemophilus influenzae NOT DETECTED NOT DETECTED Final   Neisseria meningitidis NOT DETECTED NOT  DETECTED Final   Pseudomonas aeruginosa NOT DETECTED NOT DETECTED Final   Stenotrophomonas maltophilia NOT DETECTED NOT DETECTED Final   Candida albicans NOT DETECTED NOT DETECTED Final   Candida auris NOT DETECTED NOT DETECTED Final   Candida glabrata NOT DETECTED NOT DETECTED Final   Candida krusei NOT DETECTED NOT DETECTED Final   Candida parapsilosis NOT DETECTED NOT DETECTED Final   Candida tropicalis NOT DETECTED NOT DETECTED Final   Cryptococcus neoformans/gattii NOT DETECTED NOT DETECTED Final   CTX-M ESBL NOT DETECTED NOT DETECTED Final   Carbapenem resistance IMP NOT DETECTED NOT DETECTED Final   Carbapenem resistance KPC NOT DETECTED NOT DETECTED Final   Carbapenem resistance NDM NOT DETECTED NOT DETECTED Final   Carbapenem resist OXA 48 LIKE NOT DETECTED NOT DETECTED Final   Carbapenem resistance VIM NOT DETECTED NOT DETECTED Final    Comment: Performed at [facility], [address] [place], [place] [address]  MRSA Next Gen by PCR, Nasal     Status: None   Collection Time: 02/28/22 10:20 PM   Specimen: Nasal Mucosa; Nasal Swab  Result Value Ref Range Status   MRSA by PCR Next Gen NOT DETECTED NOT DETECTED Final    Comment: (NOTE) The GeneXpert MRSA Assay (FDA approved for NASAL specimens only), is one component of a comprehensive MRSA colonization surveillance program. It is not intended to diagnose MRSA infection nor to guide or monitor treatment for MRSA infections. Test performance is not FDA approved in patients less than 58 years old. Performed at [facility], [address], [place], [place] [address]   Gram stain     Status: None   Collection Time: 03/01/22 10:20 AM   Specimen: Pleura  Result Value Ref Range Status   Specimen Description PLEURAL  Final   Special Requests PLEURAL  Final   Gram Stain   Final    NO ORGANISMS SEEN WBC PRESENT,BOTH PMN AND MONONUCLEAR CYTOSPIN SMEAR Performed at [facility], [address], [place], [place] [address]     Report Status 03/01/2022 FINAL  Final  Culture, body fluid w Gram Stain-bottle     Status: None   Collection Time: 03/01/22 10:20 AM   Specimen: Pleura  Result Value Ref Range Status   Specimen Description PLEURAL  Final   Special Requests   Final    BOTTLES DRAWN AEROBIC AND ANAEROBIC Blood Culture adequate volume   Culture   Final    NO GROWTH 5 DAYS Performed at [facility], [address], [place], [place] [address]    Report Status 03/06/2022 FINAL  Final  Culture, blood (Routine X 2) w Reflex to ID Panel     Status: None (Preliminary result)   Collection Time: 03/03/22  7:56 PM   Specimen: BLOOD RIGHT HAND  Result Value Ref Range Status   Specimen Description BLOOD RIGHT HAND  Final   Special Requests   Final    BOTTLES DRAWN AEROBIC AND ANAEROBIC Blood Culture adequate volume   Culture   Final    NO GROWTH 3 DAYS Performed at [facility], [address], [place], [place] [address]    Report Status PENDING  Incomplete  Culture, blood (Routine X 2) w Reflex to ID Panel     Status: None (Preliminary result)  Collection Time: 03/03/22  7:56 PM   Specimen: BLOOD RIGHT HAND  Result Value Ref Range Status   Specimen Description BLOOD RIGHT HAND  Final   Special Requests   Final    BOTTLES DRAWN AEROBIC AND ANAEROBIC Blood Culture adequate volume   Culture   Final    NO GROWTH 3 DAYS Performed at [facility], [address] [place], [place] [address]    Report Status PENDING  Incomplete  KOH prep     Status: None   Collection Time: 03/08/22  1:00 PM   Specimen: PATH Cytology brushing; Body Fluid  Result Value Ref Range Status   Specimen Description ESOPHAGUS  Final   Special Requests NONE  Final   KOH Prep   Final    NO YEAST OR FUNGAL ELEMENTS SEEN Performed at [facility], [address] [place],  [address]    Report Status 03/08/2022 FINAL  Final    Labs: CBC: Recent Labs  Lab 03/02/22 0238 03/03/22 0612 03/03/22 1415 03/04/22 0336 03/06/22 0413  03/07/22 1654  WBC 6.3 5.7  --  7.5 9.1 9.0  NEUTROABS  --   --   --  5.4  --   --   HGB 9.0* 9.4* 8.8* 8.5* 8.7* 8.9*  HCT 28.5* 29.4* 28.1* 26.8* 27.7* 28.0*  MCV 85.1 85.0  --  84.8 85.0 85.6  PLT 148* 154  --  152 192 528   Basic Metabolic Panel: Recent Labs  Lab 03/01/22 2123 03/02/22 0238 03/03/22 0612 03/04/22 0336 03/06/22 0413 03/07/22 1654  NA 134* 135 135 136 134* 135  K 3.3* 3.7 3.5 3.9 3.6 4.1  CL 98 99 100 100 98 98  CO2 '24 23 24 23 25 24  '$ GLUCOSE 180* 194* 227* 151* 226* 237*  BUN 49* 51* 43* 61* 46* 63*  CREATININE 4.41* 4.76* 4.41* 5.60* 5.66* 7.06*  CALCIUM 8.0* 8.1* 8.2* 8.2* 8.2* 8.2*  MG 1.8 1.9  --  1.9 2.0  --   PHOS  --  4.3 2.6  --   --  4.2   Liver Function Tests: Recent Labs  Lab 03/02/22 0238 03/03/22 0612 03/03/22 1415 03/07/22 1654  AST  --   --  15  --   ALT  --   --  21  --   ALKPHOS  --   --  93  --   BILITOT  --   --  0.7  --   PROT  --   --  5.9*  --   ALBUMIN 3.3* 3.3* 3.1* 2.8*   CBG: Recent Labs  Lab 03/07/22 1146 03/07/22 1959 03/07/22 2224 03/08/22 0702 03/08/22 1128  GLUCAP 162* 119* 255* 201* 112*    Discharge time spent: greater than 30 minutes.  Signed: Orson Eva, MD [facility] 03/08/2022

## 2022-03-09 ENCOUNTER — Telehealth: Payer: Self-pay | Admitting: *Deleted

## 2022-03-09 ENCOUNTER — Other Ambulatory Visit (HOSPITAL_COMMUNITY): Payer: Self-pay | Admitting: Internal Medicine

## 2022-03-09 ENCOUNTER — Other Ambulatory Visit: Payer: Self-pay | Admitting: Internal Medicine

## 2022-03-09 DIAGNOSIS — B3781 Candidal esophagitis: Secondary | ICD-10-CM | POA: Diagnosis not present

## 2022-03-09 DIAGNOSIS — C169 Malignant neoplasm of stomach, unspecified: Secondary | ICD-10-CM

## 2022-03-09 DIAGNOSIS — Z992 Dependence on renal dialysis: Secondary | ICD-10-CM | POA: Diagnosis not present

## 2022-03-09 DIAGNOSIS — N2581 Secondary hyperparathyroidism of renal origin: Secondary | ICD-10-CM | POA: Diagnosis not present

## 2022-03-09 DIAGNOSIS — N186 End stage renal disease: Secondary | ICD-10-CM | POA: Diagnosis not present

## 2022-03-09 DIAGNOSIS — I509 Heart failure, unspecified: Secondary | ICD-10-CM | POA: Diagnosis not present

## 2022-03-09 NOTE — Patient Outreach (Signed)
  Care Coordination [facility] Note Transition Care Management Unsuccessful Follow-up Telephone Call  Date of discharge and from where:  03/08/22 from [facility]  Attempts:  1st Attempt  Reason for unsuccessful TCM follow-up call:  Left voice message  Chong Sicilian, BSN, RN-BC RN Care Coordinator [place]: [phone] Main #: [phone]

## 2022-03-10 ENCOUNTER — Ambulatory Visit (HOSPITAL_BASED_OUTPATIENT_CLINIC_OR_DEPARTMENT_OTHER): Payer: Medicare HMO | Admitting: Family

## 2022-03-10 ENCOUNTER — Telehealth: Payer: Self-pay | Admitting: *Deleted

## 2022-03-10 NOTE — Anesthesia Postprocedure Evaluation (Signed)
Anesthesia Post Note  Patient: PARAM CAPRI  Procedure(s) Performed: ESOPHAGOGASTRODUODENOSCOPY (EGD) WITH PROPOFOL ESOPHAGEAL BRUSHING  Patient location during evaluation: Phase II Anesthesia Type: General Level of consciousness: awake Pain management: pain level controlled Vital Signs Assessment: post-procedure vital signs reviewed and stable Respiratory status: spontaneous breathing and respiratory function stable Cardiovascular status: blood pressure returned to baseline and stable Postop Assessment: no headache and no apparent nausea or vomiting Anesthetic complications: no Comments: Late entry   No notable events documented.   Last Vitals:  Vitals:   03/08/22 1358 03/08/22 1655  BP: (!) 193/57 (!) 169/57  Pulse: 61 64  Resp: 18 18  Temp:    SpO2: 96% 98%    Last Pain:  Vitals:   03/08/22 1329  TempSrc:   PainSc: 0-No pain                 [name]

## 2022-03-10 NOTE — Patient Outreach (Signed)
  Care Coordination [facility] Note Transition Care Management Unsuccessful Follow-up Telephone Call  Date of discharge and from where:  03/08/22 with RNCM  Attempts:  2nd Attempt  Reason for unsuccessful TCM follow-up call:  Left voice message  Chong Sicilian, BSN, RN-BC RN Care Coordinator [place]: [phone] Main #: [phone]

## 2022-03-11 DIAGNOSIS — N186 End stage renal disease: Secondary | ICD-10-CM | POA: Diagnosis not present

## 2022-03-11 DIAGNOSIS — Z992 Dependence on renal dialysis: Secondary | ICD-10-CM | POA: Diagnosis not present

## 2022-03-11 DIAGNOSIS — N2581 Secondary hyperparathyroidism of renal origin: Secondary | ICD-10-CM | POA: Diagnosis not present

## 2022-03-12 DIAGNOSIS — Z992 Dependence on renal dialysis: Secondary | ICD-10-CM | POA: Diagnosis not present

## 2022-03-12 DIAGNOSIS — E1122 Type 2 diabetes mellitus with diabetic chronic kidney disease: Secondary | ICD-10-CM | POA: Diagnosis not present

## 2022-03-12 DIAGNOSIS — N186 End stage renal disease: Secondary | ICD-10-CM | POA: Diagnosis not present

## 2022-03-14 DIAGNOSIS — N2581 Secondary hyperparathyroidism of renal origin: Secondary | ICD-10-CM | POA: Diagnosis not present

## 2022-03-14 DIAGNOSIS — Z992 Dependence on renal dialysis: Secondary | ICD-10-CM | POA: Diagnosis not present

## 2022-03-14 DIAGNOSIS — N186 End stage renal disease: Secondary | ICD-10-CM | POA: Diagnosis not present

## 2022-03-15 ENCOUNTER — Telehealth: Payer: Self-pay | Admitting: *Deleted

## 2022-03-15 ENCOUNTER — Ambulatory Visit (INDEPENDENT_AMBULATORY_CARE_PROVIDER_SITE_OTHER): Payer: Medicare HMO | Admitting: Family Medicine

## 2022-03-15 ENCOUNTER — Encounter (HOSPITAL_COMMUNITY): Payer: Self-pay | Admitting: Internal Medicine

## 2022-03-15 VITALS — BP 111/56 | HR 64 | Temp 97.6°F | Ht 70.0 in | Wt 190.0 lb

## 2022-03-15 DIAGNOSIS — Z23 Encounter for immunization: Secondary | ICD-10-CM

## 2022-03-15 DIAGNOSIS — I10 Essential (primary) hypertension: Secondary | ICD-10-CM

## 2022-03-15 MED ORDER — METOPROLOL SUCCINATE ER 50 MG PO TB24: 50.0000 mg | ORAL_TABLET | Freq: Every day | ORAL | 3 refills | Status: DC

## 2022-03-15 MED ORDER — DOXAZOSIN MESYLATE 2 MG PO TABS: 2.0000 mg | ORAL_TABLET | Freq: Every day | ORAL | 5 refills | Status: DC

## 2022-03-15 NOTE — Progress Notes (Signed)
   Subjective:    Patient ID: [name], male    DOB: 05/18/1956, 66 y.o.   MRN: [id number]  HPI  Hospital follow up , medication check This patient is facing some very challenging issues He has underlying heart disease, diabetes but he also has renal failure He is on dialysis 3 times a week Most of the time he is wheelchair-bound He also states he finds himself feeling weak and washed out at times low appetite low energy.  Was dealing with significant depression but he is learning how to work his way through that In addition to this he does take his medicines on a regular basis While recently in the hospital the hospitalist recommended for him to have his medications reviewed He had a significant pulmonary effusion while as an outpatient that had to be drained in order to help him breathing Review of Systems     Objective:   Physical Exam Lungs clear no crackles heart regular pulse normal extremities trace edema blood pressure good but on the lower end of normal sitting and standing       Assessment & Plan:   Transitions of care  Relative hypotension-reduce metoprolol from 100 mg down to 50 mg.  Reduce the dose of doxazosin from 4 mg down to 2 mg  Also stop trazodone due to potential side effects  May continue Abilify  Continue sertraline, Repatha, pantoprazole4 Flu vaccine recommended patient defers Will consult home health because of pressure sore on the sacrum region stage I  [facility] is his provider Possibly they will use DuoDERM  Follow-up with [place] sooner otherwise recheck 4 weeks warning signs what to watch for discussed

## 2022-03-15 NOTE — Patient Outreach (Signed)
  Care Coordination [facility] Note Transition Care Management Unsuccessful Follow-up Telephone Call  Date of discharge and from where:  03/08/22 from [facility]  Attempts:  3rd Attempt  Reason for unsuccessful TCM follow-up call:  No answer/busy  Patient was seen by PCP today for a hospital follow-up.  Chong Sicilian, BSN, RN-BC RN Care Coordinator [place] Direct Dial: [phone] Main #: [phone]

## 2022-03-16 DIAGNOSIS — Z992 Dependence on renal dialysis: Secondary | ICD-10-CM | POA: Diagnosis not present

## 2022-03-16 DIAGNOSIS — N2581 Secondary hyperparathyroidism of renal origin: Secondary | ICD-10-CM | POA: Diagnosis not present

## 2022-03-16 DIAGNOSIS — N186 End stage renal disease: Secondary | ICD-10-CM | POA: Diagnosis not present

## 2022-03-18 DIAGNOSIS — N2581 Secondary hyperparathyroidism of renal origin: Secondary | ICD-10-CM | POA: Diagnosis not present

## 2022-03-18 DIAGNOSIS — Z992 Dependence on renal dialysis: Secondary | ICD-10-CM | POA: Diagnosis not present

## 2022-03-18 DIAGNOSIS — N186 End stage renal disease: Secondary | ICD-10-CM | POA: Diagnosis not present

## 2022-03-21 DIAGNOSIS — N2581 Secondary hyperparathyroidism of renal origin: Secondary | ICD-10-CM | POA: Diagnosis not present

## 2022-03-21 DIAGNOSIS — N186 End stage renal disease: Secondary | ICD-10-CM | POA: Diagnosis not present

## 2022-03-21 DIAGNOSIS — Z992 Dependence on renal dialysis: Secondary | ICD-10-CM | POA: Diagnosis not present

## 2022-03-23 DIAGNOSIS — N2581 Secondary hyperparathyroidism of renal origin: Secondary | ICD-10-CM | POA: Diagnosis not present

## 2022-03-23 DIAGNOSIS — Z992 Dependence on renal dialysis: Secondary | ICD-10-CM | POA: Diagnosis not present

## 2022-03-23 DIAGNOSIS — N186 End stage renal disease: Secondary | ICD-10-CM | POA: Diagnosis not present

## 2022-03-28 DIAGNOSIS — N186 End stage renal disease: Secondary | ICD-10-CM | POA: Diagnosis not present

## 2022-03-28 DIAGNOSIS — N2581 Secondary hyperparathyroidism of renal origin: Secondary | ICD-10-CM | POA: Diagnosis not present

## 2022-03-28 DIAGNOSIS — Z992 Dependence on renal dialysis: Secondary | ICD-10-CM | POA: Diagnosis not present

## 2022-03-29 ENCOUNTER — Ambulatory Visit (HOSPITAL_BASED_OUTPATIENT_CLINIC_OR_DEPARTMENT_OTHER): Payer: Medicare HMO | Admitting: Family

## 2022-03-29 ENCOUNTER — Telehealth (HOSPITAL_BASED_OUTPATIENT_CLINIC_OR_DEPARTMENT_OTHER): Payer: Self-pay

## 2022-03-29 NOTE — Telephone Encounter (Signed)
Called patient and got him rescheduled for 11/2 with [name], NP

## 2022-03-29 NOTE — Progress Notes (Deleted)
Office Visit    Patient Name: [name] Date of Encounter: 03/29/2022  PCP:  [name], MD   [place]  Cardiologist:  Skeet Latch, MD Advanced Practice Provider:  No care team member to display Electrophysiologist:  None   Chief Complaint    [name] is a 66 y.o. male presents today for heart failure follow up.  Past Medical History    Past Medical History:  Diagnosis Date   Anemia    Arthritis    hips shoulders   CAD (coronary artery disease) 03/03/2019   Cancer ([place])    Esophageal cancer 2022   CHF (congestive heart failure) ([place]) 2020   Chronic combined systolic and diastolic heart failure ([place]) 04/26/2018   Admitted 11/14-11/20/19-diuresed 10L   Depression 05/31/2021   Fatigue 10/14/2020   Hypertension    Myocardial infarction [facility])    Sleep apnea    uses a bipap machine   Stroke ([place]) 12/14/2020   no last weakness or paralysis   Type 2 diabetes mellitus with diabetic nephropathy ( [place]) 04/23/2019   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   Past Surgical History:  Procedure Laterality Date   AV FISTULA PLACEMENT Left 12/21/2021   Procedure: LEFT ARM ARTERIOVENOUS (AV) FISTULA CREATION;  Surgeon: Rosetta Posner, MD;  Location: [facility];  Service: Vascular;  Laterality: Left;   BIOPSY  06/27/2019   Procedure: BIOPSY;  Surgeon: Rogene Houston, MD;  [facility];  Service: Endoscopy;;  ascending colon polyp   BIOPSY  10/21/2020   Procedure: BIOPSY;  Surgeon: Rogene Houston, MD;  [facility];  Service: Endoscopy;;  esophageal,gastric polyp   BIOPSY  02/24/2021   Procedure: BIOPSY;  Surgeon: Rogene Houston, MD;  [facility];  Service: Endoscopy;;  distal and proximal esophageal biopsies    CARDIAC SURGERY     CATARACT EXTRACTION     COLONOSCOPY N/A 06/27/2019   [name]:Diverticulosis in the entire examined colon. tubular adenoma in ascending colon, 82m tubular adenoma in prox  sigmoid. external hemorrhoids   CORONARY ARTERY BYPASS GRAFT N/A 03/25/2019   Procedure: CORONARY ARTERY BYPASS GRAFTING (CABG) X  4 USING LEFT INTERNAL MAMMARY ARTERY AND RIGHT SAPHENOUS VEIN GRAFTS;  Surgeon: LLajuana Matte MD;  [place]  Service: Open Heart Surgery;  Laterality: N/A;   ESOPHAGEAL BRUSHING  03/08/2022   Procedure: ESOPHAGEAL BRUSHING;  Surgeon: CEloise Harman DO;  [facility];  Service: Endoscopy;;   ESOPHAGOGASTRODUODENOSCOPY (EGD) WITH PROPOFOL N/A 10/21/2020   rehman:Normal hypopharynx.Normal proximal esophagus and mid esophagus.Esophageal mucosal changes consistent with long-segment Barrett's esophagus. (Focal low-grade dysplasia and atypia proximally) z line irregular 37 cm from incisors, 3 cm HH, single gastric polyp (fundic gland) normal duodenal bulb/second portion of duodenum, proximal margin of Barrett's at 34 cm   ESOPHAGOGASTRODUODENOSCOPY (EGD) WITH PROPOFOL N/A 02/24/2021   Procedure: ESOPHAGOGASTRODUODENOSCOPY (EGD) WITH PROPOFOL;  Surgeon: RRogene Houston MD;  [facility];  Service: Endoscopy;  Laterality: N/A;  1:35   ESOPHAGOGASTRODUODENOSCOPY (EGD) WITH PROPOFOL N/A 03/21/2021   Procedure: ESOPHAGOGASTRODUODENOSCOPY (EGD) WITH PROPOFOL;  Surgeon: HCarol Ada MD;  [place]  Service: Endoscopy;  Laterality: N/A;   ESOPHAGOGASTRODUODENOSCOPY (EGD) WITH PROPOFOL N/A 10/23/2021   Procedure: ESOPHAGOGASTRODUODENOSCOPY (EGD) WITH PROPOFOL;  Surgeon: DDoran Stabler MD;  [place]: [place]  Service: Gastroenterology;  Laterality: N/A;   ESOPHAGOGASTRODUODENOSCOPY (EGD) WITH PROPOFOL N/A 03/08/2022   Procedure: ESOPHAGOGASTRODUODENOSCOPY (EGD) WITH PROPOFOL;  Surgeon: Eloise Harman, DO;  [facility];  Service: Endoscopy;  Laterality: N/A;   EXCISION MORTON'S NEUROMA     EYE SURGERY Left    retina   INCISION AND DRAINAGE OF WOUND Right 06/28/2021   Procedure: IRRIGATION AND DEBRIDEMENT  WOUND;  Surgeon: [name], MD;  Location: [place];  Service: Plastics;  Laterality: Right;  1.5 hour   INSERTION OF DIALYSIS CATHETER Right 12/21/2021   Procedure: INSERTION OF TUNNELED DIALYSIS CATHETER;  Surgeon: Rosetta Posner, MD;  [facility];  Service: Vascular;  Laterality: Right;   LEFT HEART CATH AND CORS/GRAFTS ANGIOGRAPHY N/A 02/01/2022   Procedure: LEFT HEART CATH AND CORS/GRAFTS ANGIOGRAPHY;  Surgeon: Martinique, Peter M, MD;  Location: [place] [facility];  Service: Cardiovascular;  Laterality: N/A;   POLYPECTOMY  06/27/2019   Procedure: POLYPECTOMY;  Surgeon: Rogene Houston, MD;  [facility];  Service: Endoscopy;;  proximal sigmoid colon   RIGHT/LEFT HEART CATH AND CORONARY ANGIOGRAPHY N/A 03/12/2019   Procedure: RIGHT/LEFT HEART CATH AND CORONARY ANGIOGRAPHY;  Surgeon: [name], MD;  Location: [place] [facility];  Service: Cardiovascular;  Laterality: N/A;   SHOULDER ARTHROSCOPY WITH ROTATOR CUFF REPAIR AND SUBACROMIAL DECOMPRESSION Right 08/26/2020   Procedure: RIGHT SHOULDER MINI OPEN ROTATOR CUFF REPAIR AND SUBACROMIAL DECOMPRESSION WITH PATCH GRAFT;  Surgeon: Susa Day, MD;  Location: [facility];  Service: Orthopedics;  Laterality: Right;  90 MINS GENERAL WITH BLOCK   SKIN SPLIT GRAFT Right 06/28/2021   Procedure: SKIN GRAFT SPLIT THICKNESS;  Surgeon: [name], MD;  Location: [place];  Service: Plastics;  Laterality: Right;   TEE WITHOUT CARDIOVERSION N/A 03/25/2019   Procedure: TRANSESOPHAGEAL ECHOCARDIOGRAM (TEE);  Surgeon: [name], MD;  Location: [place];  Service: Open Heart Surgery;  Laterality: N/A;   Allergies  Allergies  Allergen Reactions   Bee Venom Anaphylaxis   Atorvastatin Other (See Comments)    myalgia   Diltiazem Itching   Reglan [Metoclopramide] Other (See Comments)    Suicidal    Rosuvastatin Other (See Comments)    myalgias   Valsartan Itching    History of Present Illness    [name] is a 66  y.o. male with a hx of CVA 09/979, combined systolic and diastolic heart failure, PAD, depression, esophageal cancer, CAD post CABG 03/2019, HTN, MI, DM2, ESRD on HD, PVC last seen while hospitalized. Pleasant gentleman who previously worked as a Forensic psychologist in [employer].  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  Blood pressure control has been difficult due to intolerance to amlodipine, lisinopril, hydrochlorothiazide, beta-blocker due to sensation of fatigue. Has been able to tolerate Doxazosin and Valsartan.   Admitted 12/14/2020 MRI small subacute to chronic infarct in left centrum semiovale and no obvious to LVO. At follow-up with neurology ASA was increased to '325mg'$ . ZIO 12/24/20 due to lightheadedness with predominantly NSR, 3.5% PVC burden, 3 episodes NSVT up to 5 beats and up to 12 runs of SVT.    Carotid duplex 01/21/21 no significant stenosis bilaterally. Due to exertional dyspnea myoview performed 02/2021 with infarct with mild peri-infarct ischemia recommended for medical management given CKD.   He was seen 08/04/21 volume up - Torsemide dose increased for 4 days, referred to SW, referred to home health as well as re-referred to psychology for depression. Seen 08/25/21 with weight up but edema decreased and same Torsemide dose continued.   Hospitalized 08/29/21-09/01/21 for decompensated heart failure with weight gain of 15  lbs. IV diuresed and discharged on Torsemide '40mg'$  QD. Diuresed 10 pounds with discharge weight 203.2 lbs.   Admitted 4/18 - 10/04/2021 to [facility] due to hematemesis.  Underwent EGD with RFA 09/27/2021.  Hemoglobin remained stable w/o transfusion.  GI felt symptoms were treated to recent RFA and did not recommend repeat endoscopy.  Aspirin resumed on discharge. He underwent PET scan due to known esophageal adenocarcinoma which oncology felt represented local irritation/inflammation and not progression.    Admitted 5/10 - 10/28/2021 with acute on chronic heart failure.  He was  diuresed 8.2 L and down 17 pounds. Discharged on torsemide 40 mg daily.  He required 4 units PRBC.  EGD with esophageal stenosis, circumferential clean ulceration in distal esophagus. Discharge weight 187.66.  On 01/07/22 he started HD. Follows with Dr. Suezanne Cheshire.   Admitted 8/19-8/25/23 for chest pain. LHC 02/01/22 with patent grafts recommended for medical management. VT during HD 02/01/22 that did not recur.   Admitted 9/18-9/26/23 after dyspnea at HD requiring 4L and BIPAP. Diagnosed and treated for gram negative bacteremia. CT showed no PE but did show large right pleural effusion. Underwent 1.3L thoracentesis. Metoprolol succinate adjusted to '100mg'$  QD ***  EKGs/Labs/Other Studies Reviewed:   The following studies were reviewed today:  Echocardiogram 03/01/2022:  1. Left ventricular ejection fraction, by estimation, is 40 to 45%. The  left ventricle has mildly decreased function. The left ventricle  demonstrates global hypokinesis. There is moderate left ventricular  hypertrophy. Left ventricular diastolic  parameters are indeterminate.   2. Ventricular septum is flattened in systole consistent with RV pressure  overload. . Right ventricular systolic function is moderately reduced. The  right ventricular size is severely enlarged. There is mildly elevated  pulmonary artery systolic  pressure.   3. Left atrial size was severely dilated.   4. Catheter tip noted in RA. Right atrial size was severely dilated.   5. The mitral valve is normal in structure. No evidence of mitral valve  regurgitation. No evidence of mitral stenosis.   6. The tricuspid valve is abnormal.   7. The aortic valve is tricuspid. There is mild calcification of the  aortic valve. There is mild thickening of the aortic valve. Aortic valve  regurgitation is not visualized. No aortic stenosis is present.   8. The inferior vena cava is dilated in size with >50% respiratory  variability, suggesting right atrial  pressure of 8 mmHg.   LHC 02/01/22   Mid LM to Dist LM lesion is 25% stenosed.   Ost LAD to Prox LAD lesion is 50% stenosed.   Ost Cx to Prox Cx lesion is 90% stenosed.   Dist RCA-1 lesion is 90% stenosed.   Mid RCA lesion is 75% stenosed.   1st Diag lesion is 80% stenosed.   2nd Diag lesion is 75% stenosed.   1st Mrg lesion is 80% stenosed.   2nd Mrg lesion is 80% stenosed.   Mid LAD lesion is 100% stenosed.   RPAV lesion is 80% stenosed.   Dist RCA-2 lesion is 100% stenosed.   SVG graft was visualized by angiography and is normal in caliber.   SVG graft was visualized by angiography and is normal in caliber.   LIMA graft was visualized by angiography and is large.   SVG graft was visualized by angiography and is normal in caliber.   The graft exhibits no disease.   The graft exhibits no disease.   The graft exhibits no disease.   The graft exhibits no disease.  LV end diastolic pressure is mildly elevated.   Severe 3 vessel obstructive CAD Patent LIMA to the LAD Patent SVG to large second diagonal Patent SVG to OM Patent SVG to PL branch of the RCA Elevated LVEDP 25 mm Hg   Plan: continue medical management.  Echo 04/26/2018: - Left ventricle: The cavity size was normal. There was minimal    concentric hypertrophy. Systolic function was mildly reduced. The    estimated ejection fraction was in the range of 45% to 50%.    Diffuse hypokinesis. Findings consistent with left ventricular    diastolic dysfunction, grade indeterminate. Doppler parameters    are consistent with high ventricular filling pressure.  - Mitral valve: Mildly calcified annulus. There was mild    regurgitation.  - Left atrium: The atrium was severely dilated.  - Right atrium: The atrium was mildly dilated.  - Inferior vena cava: The vessel was dilated. The respirophasic    diameter changes were blunted (< 50%), consistent with elevated    central venous pressure. Estimated CVP 15 mmHg.     ___________________________________________________________  Right/Left Heart Cath and Coronary Angiography 03/12/2019: Mid LAD lesion is 80% stenosed. Ost LAD to Mid LAD lesion is 50% stenosed. 1st Diag lesion is 80% stenosed. 2nd Diag lesion is 75% stenosed. Mid LM to Dist LM lesion is 25% stenosed. Prox Cx lesion is 90% stenosed. 1st Mrg lesion is 80% stenosed. 2nd Mrg lesion is 80% stenosed. Dist RCA lesion is 90% stenosed. Mid RCA lesion is 75% stenosed. RPAV lesion is 80% stenosed. LV end diastolic pressure is moderately elevated. There is no aortic valve stenosis. Hemodynamic findings consistent with mild pulmonary hypertension.   Severe multivessel CAD.  Plan for outpatient evaluation for CABG>  His symptoms are all with exertion.  He feels fine at rest.  I instructed him to avoid any strenuous activity, or anything that would bring on shortness of breath.  Continue aspirin and Imdur.   Continue Crestor.    ___________________________________________________________  Echo 03/15/2019  1. Left ventricular ejection fraction, by visual estimation, is 45 to  50%. The left ventricle has mildly decreased function. There is mildly  increased left ventricular hypertrophy.   2. Left ventricular diastolic Doppler parameters are consistent with  impaired relaxation pattern of LV diastolic filling.   3. Global right ventricle has normal systolic function.The right  ventricular size is normal. No increase in right ventricular wall  thickness.   4. Left atrial size was severely dilated.   5. Right atrial size was normal.   6. Mild mitral annular calcification.   7. Moderate calcification of the mitral valve leaflet(s).   8. Moderate thickening of the mitral valve leaflet(s).   9. The mitral valve is abnormal. Trace mitral valve regurgitation. No  evidence of mitral stenosis.  10. The tricuspid valve is normal in structure. Tricuspid valve  regurgitation is trivial.  11. The aortic valve  is tricuspid Aortic valve regurgitation was not  visualized by color flow Doppler. Mild aortic valve sclerosis without  stenosis.  12. The pulmonic valve was not well visualized. Pulmonic valve  regurgitation is mild by color flow Doppler.  13. Mildly elevated pulmonary artery systolic pressure.   ___________________________________________________________  VAS US DOPPLER PRE CABG 03/21/2019:  Right Carotid: Velocities in the right ICA are consistent with a 1-39%  stenosis.   Left Carotid: There is no evidence of stenosis in the left ICA.  Vertebrals: Bilateral vertebral arteries demonstrate antegrade flow.   Bilateral Extremity: Doppler waveforms remain  within normal limits with  compression bilaterally for the radial arteries. Doppler waveforms remain  within normal limits with compression bilaterally for the ulnar arteries.   ___________________________________________________________  ECHO INTRAOPERATIVE TEE 03/25/2019: POST-OP IMPRESSIONS  - Aorta: The aorta appears unchanged from pre-bypass.  - Left Atrial Appendage: The left atrial appendage appears unchanged from  pre-bypass.  - Aortic Valve: The aortic valve appears unchanged from pre-bypass.  - Mitral Valve: The mitral valve appears unchanged from pre-bypass.  - Tricuspid Valve: The tricuspid valve appears unchanged from pre-bypass.  - Pericardium: The pericardium appears unchanged from pre-bypass.   ___________________________________________________________  VAS [place] LOWER EXTREMITY VENOUS 03/30/2019: Right: There is no evidence of deep vein thrombosis in the lower  extremity.  Left: No evidence of common femoral vein obstruction.   ___________________________________________________________  Echo 05/29/2019 1. Left ventricular ejection fraction, by visual estimation, is 50 to  55%. The left ventricle has low normal function. There is mildly increased  left ventricular hypertrophy.   2. Basal anterolateral segment,  basal inferior segment, and basal  inferolateral segment are abnormal.   3. The left ventricle demonstrates regional wall motion abnormalities.   4. Global right ventricle has mildly reduced systolic function.The right  ventricular size is normal. No increase in right ventricular wall  thickness.   5. Left atrial size was moderately dilated.   6. Right atrial size was normal.   7. Moderate mitral annular calcification.   8. The mitral valve is grossly normal. Trivial mitral valve  regurgitation.   9. The tricuspid valve is grossly normal. Tricuspid valve regurgitation  is trivial.  10. The aortic valve is tricuspid. Aortic valve regurgitation is not  visualized.  11. The pulmonic valve was grossly normal. Pulmonic valve regurgitation is  trivial.  12. Normal pulmonary artery systolic pressure.  13. The tricuspid regurgitant velocity is 2.30 m/s, and with an assumed  right atrial pressure of 3 mmHg, the estimated right ventricular systolic  pressure is normal at 24.2 mmHg.  14. The inferior vena cava is normal in size with greater than 50%  respiratory variability, suggesting right atrial pressure of 3 mmHg.   ___________________________________________________________  Echo 09/2019:  1. Left ventricular ejection fraction, by estimation, is 55 to 60%. The  left ventricle has normal function. The left ventricle has no regional  wall motion abnormalities. There is moderate left ventricular hypertrophy.  Left ventricular diastolic  parameters are consistent with Grade I diastolic dysfunction (impaired  relaxation).   2. Right ventricular systolic function is normal. The right ventricular  size is normal.   3. Left atrial size was severely dilated.   4. The mitral valve is normal in structure. Trivial mitral valve  regurgitation. No evidence of mitral stenosis.   5. The aortic valve was not well visualized. Aortic valve regurgitation  is not visualized. No aortic stenosis is present.    6. The inferior vena cava is normal in size with greater than 50%  respiratory variability, suggesting right atrial pressure of 3 mmHg.   ___________________________________________________________  Carotid duplex 01/21/21  Right: Mild to moderate partially calcified plaque. No significant stenosis.  Left: Mild partially calcified plaque. No signifcant stenosis.   ___________________________________________________________  Brantley Fling 02/2021 Findings are consistent with prior myocardial infarction with peri-infarct ischemia. The study is high risk.   No ST deviation was noted.   LV perfusion is abnormal. There is evidence of ischemia. There is evidence of infarction. Defect 1: There is a medium defect with moderate reduction in uptake present in the  mid to basal inferior and inferolateral location(s) that is fixed. There is abnormal wall motion in the defect area. Consistent with infarction and peri-infarct ischemia.   Left ventricular function is abnormal. Global function is severely reduced. There was a single regional abnormality. Nuclear stress EF: 28 %. The left ventricular ejection fraction is severely decreased (<30%). End diastolic cavity size is moderately enlarged. End systolic cavity size is moderately enlarged.   Prior study available for comparison from 09/25/2019. There are changes compared to prior study. The left ventricular ejection fraction has decreased. LVEF 35%, moderate size and intensity fixed inferior defect suggestive of scar with mild peri-infarct ischemia   Moderate size and intensity, mostly fixed basal to mid inferior and inferolateral perfusion defect (SDS 1), suggestive of scar with minimal peri-infarct ischemia. LVEF 28% with global hypokinesis and basal to mid inferior akinesis. This is a high risk study. In comparison with a prior study in 2021, the LVEF is lower (was previously 35%), however, there are no new reversible perfusion defects.   ___________________________________________________________  Echo 07/25/21  1. Left ventricular ejection fraction, by estimation, is 40 to 45%. The  left ventricle has mildly decreased function. The left ventricle  demonstrates regional wall motion abnormalities (see scoring  diagram/findings for description). There is mild  concentric left ventricular hypertrophy. Left ventricular diastolic  function could not be evaluated. There is akinesis of the left  ventricular, basal inferior wall and inferoseptal wall.   2. Right ventricular systolic function is moderately reduced. The right  ventricular size is severely enlarged. There is moderately elevated  pulmonary artery systolic pressure. The estimated right ventricular  systolic pressure is 14.4 mmHg.   3. Left atrial size was moderately dilated.   4. The mitral valve is degenerative. Mild to moderate mitral valve  regurgitation. No evidence of mitral stenosis.   5. The aortic valve is normal in structure. Aortic valve regurgitation is  not visualized. No aortic stenosis is present.   6. Aortic dilatation noted. There is mild dilatation of the aortic root,  measuring 39 mm.   7. The inferior vena cava is dilated in size with <50% respiratory  variability, suggesting right atrial pressure of 15 mmHg.   EKG:  No EKG today  Recent Labs: 02/28/2022: B Natriuretic Peptide >4,500.0 03/03/2022: ALT 21; TSH 2.367 03/06/2022: Magnesium 2.0 03/07/2022: BUN 63; Creatinine, Ser 7.06; Hemoglobin 8.9; Platelets 244; Potassium 4.1; Sodium 135  Recent Lipid Panel    Component Value Date/Time   CHOL 92 01/31/2022 1330   CHOL 168 12/25/2020 1208   TRIG 89 01/31/2022 1330   HDL 50 01/31/2022 1330   HDL 48 12/25/2020 1208   CHOLHDL 1.8 01/31/2022 1330   VLDL 18 01/31/2022 1330   LDLCALC 24 01/31/2022 1330   LDLCALC 76 12/25/2020 1208    Home Medications   No outpatient medications have been marked as taking for the 03/29/22 encounter  (Appointment) with Loel Dubonnet, NP.     Review of Systems      All other systems reviewed and are otherwise negative except as noted above.  Physical Exam    VS:  There were no vitals taken for this visit. , BMI There is no height or weight on file to calculate BMI.  Wt Readings from Last 3 Encounters:  03/15/22 190 lb (86.2 kg)  03/08/22 181 lb 14.1 oz (82.5 kg)  02/04/22 179 lb 0.2 oz (81.2 kg)   *** GEN: Well nourished, well developed, in no acute distress. HEENT: normal. Neck:  Supple, no JVD, carotid bruits, or masses. Cardiac: RRR, no murmurs, rubs, or gallops. No clubbing, cyanosis, edema.  Radials/PT 2+ and equal bilaterally.  Respiratory:  Respirations regular and unlabored, clear to auscultation bilaterally. GI: Soft, nontender, nondistended. MS: No deformity or atrophy. Skin: Warm and dry, no rash. Neuro:  Strength and sensation are intact. Psych: Normal affect.  Assessment & Plan    Fatigue - This remains his chief complaint. We again discussed that this is multifactorial HF, cancer, ESRD. Recent CBC, thyroid panel normal. He is on supplements for vitamin D, B12, and folate at SNF. Will defer further management to SNF provider as no concerning cardiac symptoms today. ***  CAD s/p CABG - Myoview 02/2021 with infarct with mild peri-infarct ischemia recommended for medical management given CKD. GDMT includes aspirin, Repatha, Carvedilol. No anginal symptoms. No indication for ischemic evaluation at this time.  Heart healthy diet and regular cardiovascular exercise encouraged.  ***  PVC / SVT / VT -ZIO 12/2020 with predominantly NSR with PVC burden 3.5%, 3 episodes NSVT up to 5 beats and up to 12 runs of SVT. Carvedilol switched to Metoprolol and Amiodarone initiated during recent admission. ***  HLD - Statin intolerance. On Repatha.***  HTN-  BP per his report 140s at SNF. Continue Coreg 12.'5mg'$  BID, Doxazosin '4mg'$  QD, Imdur '15mg'$  BID, Hydralazine '100mg'$  TID. Previously  provided instruction for SNF to contact our office if BP consistently >140/90. If noted, could increase Imdur or Doxazosin dose. ***  HFrEF - 07/2021 and 02/2022 LVEF 40-45%, moderate RV dysfunction. GDMT limited by renal function. Now diuresed per HD.  Continue Torsemide as he is still able to make urine. Will defer dosing to nephrology. Present GDMT includes Coreg, Hydralazine, Imdur, Torsemide. Fluid restriction <2L and low sodium diet encouraged. Daily weights encouraged at SNF.    Depression - Follows with PCP. ***  History of CVA- Follows with neurology. *** Continue Repatha.   CKD - Follows with Dr. Clover Mealy of nephrology. Careful titration of diuretic and antihypertensive***  Esophageal cancer / anemia - Follows with oncology. ***  Disposition: Follow up *** with Dr. Oval Linsey.  Signed, Loel Dubonnet, NP 03/29/2022, 8:12 AM [place]

## 2022-03-30 DIAGNOSIS — N186 End stage renal disease: Secondary | ICD-10-CM | POA: Diagnosis not present

## 2022-03-30 DIAGNOSIS — R6889 Other general symptoms and signs: Secondary | ICD-10-CM | POA: Diagnosis not present

## 2022-03-30 DIAGNOSIS — N2581 Secondary hyperparathyroidism of renal origin: Secondary | ICD-10-CM | POA: Diagnosis not present

## 2022-03-30 DIAGNOSIS — Z992 Dependence on renal dialysis: Secondary | ICD-10-CM | POA: Diagnosis not present

## 2022-04-01 DIAGNOSIS — Z992 Dependence on renal dialysis: Secondary | ICD-10-CM | POA: Diagnosis not present

## 2022-04-01 DIAGNOSIS — N186 End stage renal disease: Secondary | ICD-10-CM | POA: Diagnosis not present

## 2022-04-01 DIAGNOSIS — N2581 Secondary hyperparathyroidism of renal origin: Secondary | ICD-10-CM | POA: Diagnosis not present

## 2022-04-04 DIAGNOSIS — Z992 Dependence on renal dialysis: Secondary | ICD-10-CM | POA: Diagnosis not present

## 2022-04-04 DIAGNOSIS — N2581 Secondary hyperparathyroidism of renal origin: Secondary | ICD-10-CM | POA: Diagnosis not present

## 2022-04-04 DIAGNOSIS — N186 End stage renal disease: Secondary | ICD-10-CM | POA: Diagnosis not present

## 2022-04-06 DIAGNOSIS — N186 End stage renal disease: Secondary | ICD-10-CM | POA: Diagnosis not present

## 2022-04-06 DIAGNOSIS — Z992 Dependence on renal dialysis: Secondary | ICD-10-CM | POA: Diagnosis not present

## 2022-04-06 DIAGNOSIS — N2581 Secondary hyperparathyroidism of renal origin: Secondary | ICD-10-CM | POA: Diagnosis not present

## 2022-04-06 DIAGNOSIS — R6889 Other general symptoms and signs: Secondary | ICD-10-CM | POA: Diagnosis not present

## 2022-04-07 DIAGNOSIS — E1165 Type 2 diabetes mellitus with hyperglycemia: Secondary | ICD-10-CM | POA: Diagnosis not present

## 2022-04-07 DIAGNOSIS — N19 Unspecified kidney failure: Secondary | ICD-10-CM | POA: Diagnosis not present

## 2022-04-07 DIAGNOSIS — I25810 Atherosclerosis of coronary artery bypass graft(s) without angina pectoris: Secondary | ICD-10-CM | POA: Diagnosis not present

## 2022-04-07 DIAGNOSIS — E113593 Type 2 diabetes mellitus with proliferative diabetic retinopathy without macular edema, bilateral: Secondary | ICD-10-CM | POA: Diagnosis not present

## 2022-04-08 DIAGNOSIS — Z992 Dependence on renal dialysis: Secondary | ICD-10-CM | POA: Diagnosis not present

## 2022-04-08 DIAGNOSIS — N2581 Secondary hyperparathyroidism of renal origin: Secondary | ICD-10-CM | POA: Diagnosis not present

## 2022-04-08 DIAGNOSIS — N186 End stage renal disease: Secondary | ICD-10-CM | POA: Diagnosis not present

## 2022-04-11 ENCOUNTER — Other Ambulatory Visit: Payer: Self-pay | Admitting: *Deleted

## 2022-04-11 ENCOUNTER — Other Ambulatory Visit: Payer: Self-pay | Admitting: Family Medicine

## 2022-04-11 DIAGNOSIS — Z992 Dependence on renal dialysis: Secondary | ICD-10-CM | POA: Diagnosis not present

## 2022-04-11 DIAGNOSIS — N186 End stage renal disease: Secondary | ICD-10-CM | POA: Diagnosis not present

## 2022-04-11 DIAGNOSIS — N2581 Secondary hyperparathyroidism of renal origin: Secondary | ICD-10-CM | POA: Diagnosis not present

## 2022-04-12 ENCOUNTER — Ambulatory Visit (INDEPENDENT_AMBULATORY_CARE_PROVIDER_SITE_OTHER): Payer: Medicare HMO | Admitting: Family Medicine

## 2022-04-12 VITALS — BP 124/61 | HR 76 | Wt 189.0 lb

## 2022-04-12 DIAGNOSIS — N184 Chronic kidney disease, stage 4 (severe): Secondary | ICD-10-CM

## 2022-04-12 DIAGNOSIS — L03032 Cellulitis of left toe: Secondary | ICD-10-CM

## 2022-04-12 DIAGNOSIS — I10 Essential (primary) hypertension: Secondary | ICD-10-CM | POA: Diagnosis not present

## 2022-04-12 DIAGNOSIS — E1122 Type 2 diabetes mellitus with diabetic chronic kidney disease: Secondary | ICD-10-CM | POA: Diagnosis not present

## 2022-04-12 DIAGNOSIS — Z992 Dependence on renal dialysis: Secondary | ICD-10-CM

## 2022-04-12 DIAGNOSIS — N186 End stage renal disease: Secondary | ICD-10-CM

## 2022-04-12 DIAGNOSIS — I5042 Chronic combined systolic (congestive) and diastolic (congestive) heart failure: Secondary | ICD-10-CM

## 2022-04-12 DIAGNOSIS — E1159 Type 2 diabetes mellitus with other circulatory complications: Secondary | ICD-10-CM

## 2022-04-12 DIAGNOSIS — I5043 Acute on chronic combined systolic (congestive) and diastolic (congestive) heart failure: Secondary | ICD-10-CM | POA: Diagnosis not present

## 2022-04-12 MED ORDER — CEFDINIR 300 MG PO CAPS: ORAL_CAPSULE | ORAL | 0 refills | Status: DC

## 2022-04-12 NOTE — Progress Notes (Signed)
   Subjective:    Patient ID: [name], male    DOB: [birth year], 66 y.o.   MRN: [id number]  HPI 4 week follow up  C/o weakness and right great toe redness   Request for home health services to help with AdL's and PT at home  Patient is homebound Does dialysis 3 days a week Mild depression but patient states stable Has underlying heart disease breathing doing okay no significant swelling stable currently Patient has subpar stamina difficult time helping himself  Review of Systems     Objective:   Physical Exam General-in no acute distress Eyes-no discharge Lungs-respiratory rate normal, CTA CV-no murmurs,RRR Extremities skin warm dry no edema Neuro grossly normal Behavior normal, alert  Cellulitis left great toenail with some fluctuance       Assessment & Plan:  1. Cellulitis of great toe, left Antibiotic have to adjust for his dialysis taken on the 3 days a week +1 dose on Sundays  2. Chronic combined systolic and diastolic heart failure (HCC) Stable  3. ESRD on dialysis ([place]) 3 days a week  Patient is homebound Long-term prognosis not good Patient would benefit from assistance Lives by himself at home Has a dear friend who looks in on him a few times a week Patient needs a helper at home

## 2022-04-13 ENCOUNTER — Telehealth: Payer: Self-pay | Admitting: *Deleted

## 2022-04-13 ENCOUNTER — Other Ambulatory Visit: Payer: Self-pay

## 2022-04-13 DIAGNOSIS — N186 End stage renal disease: Secondary | ICD-10-CM | POA: Diagnosis not present

## 2022-04-13 DIAGNOSIS — N2581 Secondary hyperparathyroidism of renal origin: Secondary | ICD-10-CM | POA: Diagnosis not present

## 2022-04-13 DIAGNOSIS — I5043 Acute on chronic combined systolic (congestive) and diastolic (congestive) heart failure: Secondary | ICD-10-CM

## 2022-04-13 DIAGNOSIS — R6889 Other general symptoms and signs: Secondary | ICD-10-CM | POA: Diagnosis not present

## 2022-04-13 DIAGNOSIS — Z992 Dependence on renal dialysis: Secondary | ICD-10-CM | POA: Diagnosis not present

## 2022-04-13 NOTE — Chronic Care Management (AMB) (Signed)
  Care Coordination  Outreach Note  04/13/2022 Name: [name] MRN: [id number] DOB: 22-Aug-1955   Care Coordination Outreach Attempts: An unsuccessful telephone outreach was attempted today to offer the patient information about available care coordination services as a benefit of their health plan.   Follow Up Plan:  Additional outreach attempts will be made to offer the patient care coordination information and services.   Encounter Outcome:  No Answer  [place]  Direct Dial: [phone]

## 2022-04-14 ENCOUNTER — Ambulatory Visit (HOSPITAL_BASED_OUTPATIENT_CLINIC_OR_DEPARTMENT_OTHER): Payer: Medicare HMO | Admitting: Family

## 2022-04-14 DIAGNOSIS — Z9842 Cataract extraction status, left eye: Secondary | ICD-10-CM | POA: Diagnosis not present

## 2022-04-14 DIAGNOSIS — H4313 Vitreous hemorrhage, bilateral: Secondary | ICD-10-CM | POA: Diagnosis not present

## 2022-04-14 DIAGNOSIS — Z9841 Cataract extraction status, right eye: Secondary | ICD-10-CM | POA: Diagnosis not present

## 2022-04-14 DIAGNOSIS — E113592 Type 2 diabetes mellitus with proliferative diabetic retinopathy without macular edema, left eye: Secondary | ICD-10-CM | POA: Diagnosis not present

## 2022-04-14 DIAGNOSIS — E113511 Type 2 diabetes mellitus with proliferative diabetic retinopathy with macular edema, right eye: Secondary | ICD-10-CM | POA: Diagnosis not present

## 2022-04-14 DIAGNOSIS — Z961 Presence of intraocular lens: Secondary | ICD-10-CM | POA: Diagnosis not present

## 2022-04-14 NOTE — Chronic Care Management (AMB) (Signed)
  Care Coordination   Note   04/14/2022 Name: [name] MRN: [id number] DOB: 06-14-1955  [name] is a 66 y.o. year old male who sees [name], [name], MD for primary care. I reached out to [name] by phone today to offer care coordination services.  [name] was given information about Care Coordination services today including:   The Care Coordination services include support from the care team which includes your Nurse Coordinator, Clinical Social Worker, or Pharmacist.  The Care Coordination team is here to help remove barriers to the health concerns and goals most important to you. Care Coordination services are voluntary, and the patient may decline or stop services at any time by request to their care team member.   Care Coordination Consent Status: Patient agreed to services and verbal consent obtained.   Follow up plan:  Telephone appointment with care coordination team member scheduled for:  04/21/22  Encounter Outcome:  Pt. Scheduled  [name]  Direct Dial: [phone]

## 2022-04-15 DIAGNOSIS — Z992 Dependence on renal dialysis: Secondary | ICD-10-CM | POA: Diagnosis not present

## 2022-04-15 DIAGNOSIS — N2581 Secondary hyperparathyroidism of renal origin: Secondary | ICD-10-CM | POA: Diagnosis not present

## 2022-04-15 DIAGNOSIS — N186 End stage renal disease: Secondary | ICD-10-CM | POA: Diagnosis not present

## 2022-04-18 DIAGNOSIS — Z992 Dependence on renal dialysis: Secondary | ICD-10-CM | POA: Diagnosis not present

## 2022-04-18 DIAGNOSIS — N2581 Secondary hyperparathyroidism of renal origin: Secondary | ICD-10-CM | POA: Diagnosis not present

## 2022-04-18 DIAGNOSIS — N186 End stage renal disease: Secondary | ICD-10-CM | POA: Diagnosis not present

## 2022-04-19 ENCOUNTER — Encounter (HOSPITAL_COMMUNITY): Payer: Self-pay

## 2022-04-19 ENCOUNTER — Ambulatory Visit (HOSPITAL_COMMUNITY): Payer: Medicare HMO

## 2022-04-19 ENCOUNTER — Telehealth (HOSPITAL_BASED_OUTPATIENT_CLINIC_OR_DEPARTMENT_OTHER): Payer: Self-pay | Admitting: *Deleted

## 2022-04-19 ENCOUNTER — Ambulatory Visit (HOSPITAL_BASED_OUTPATIENT_CLINIC_OR_DEPARTMENT_OTHER): Payer: Medicare HMO | Admitting: Cardiovascular Disease

## 2022-04-19 NOTE — Telephone Encounter (Signed)
Left message to call back to get today's visit with [name] rescheduled

## 2022-04-20 ENCOUNTER — Encounter: Payer: Self-pay | Admitting: Vascular Surgery

## 2022-04-20 ENCOUNTER — Other Ambulatory Visit: Payer: Self-pay

## 2022-04-20 ENCOUNTER — Ambulatory Visit (INDEPENDENT_AMBULATORY_CARE_PROVIDER_SITE_OTHER): Payer: Medicare HMO

## 2022-04-20 ENCOUNTER — Ambulatory Visit (INDEPENDENT_AMBULATORY_CARE_PROVIDER_SITE_OTHER): Payer: Medicare HMO | Admitting: Vascular Surgery

## 2022-04-20 VITALS — BP 155/83 | HR 80 | Temp 98.2°F | Ht 70.0 in | Wt 190.0 lb

## 2022-04-20 DIAGNOSIS — N186 End stage renal disease: Secondary | ICD-10-CM

## 2022-04-20 DIAGNOSIS — N2581 Secondary hyperparathyroidism of renal origin: Secondary | ICD-10-CM | POA: Diagnosis not present

## 2022-04-20 DIAGNOSIS — Z992 Dependence on renal dialysis: Secondary | ICD-10-CM | POA: Diagnosis not present

## 2022-04-20 DIAGNOSIS — L03032 Cellulitis of left toe: Secondary | ICD-10-CM

## 2022-04-20 NOTE — Progress Notes (Signed)
[facility] [place]  Patient name: [name] MRN: [id number] DOB: 1956-06-12 Sex: male  REASON FOR VISIT: Evaluation left brachiocephalic AV fistula  HPI: [name] is a 66 y.o. male known to me from prior left brachiocephalic AV fistula creation on 12/21/2021.  At my last office visit, he appeared to have a nicely developing fistula.  He has had several uses of his fistula and suffered a significant infiltration several weeks ago.  He reports that the initial technician had no difficulty but subsequently the technician had difficulty with multiple sticks and ended up with an infiltration.  He continues to have a right IJ tunnel catheter and is currently using this for dialysis.  Past Medical History:  Diagnosis Date   Anemia    Arthritis    hips shoulders   CAD (coronary artery disease) 03/03/2019   Cancer ([place])    Esophageal cancer 2022   CHF (congestive heart failure) ([place]) 2020   Chronic combined systolic and diastolic heart failure ([place]) 04/26/2018   Admitted 11/14-11/20/19-diuresed 10L   Depression 05/31/2021   Fatigue 10/14/2020   Hypertension    Myocardial infarction [facility])    Sleep apnea    uses a bipap machine   Stroke ([place]) 12/14/2020   no last weakness or paralysis   Type 2 diabetes mellitus with diabetic nephropathy ([place]) 04/23/2019   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    Family History  Problem Relation Age of Onset   Colon cancer Mother    Heart Problems Father    Diabetes Father    Valvular heart disease Father    Sleep apnea Neg Hx    Stroke Neg Hx     SOCIAL HISTORY: Social History   Tobacco Use   Smoking status: Former    Types: Cigarettes    Quit date: 07/06/1988    Years since quitting: 33.8   Smokeless tobacco: Never  Substance Use Topics   Alcohol use: Not Currently    Alcohol/week: 2.0 standard drinks of alcohol    Types: 2 Glasses of wine per week    Comment:  socially    Allergies  Allergen Reactions   Bee Venom Anaphylaxis   Atorvastatin Other (See Comments)    myalgia   Diltiazem Itching   Reglan [Metoclopramide] Other (See Comments)    Suicidal    Rosuvastatin Other (See Comments)    myalgias   Valsartan Itching    Current Outpatient Medications  Medication Sig Dispense Refill   acetaminophen (TYLENOL) 325 MG tablet Take 2 tablets (650 mg total) by mouth every 6 (six) hours as needed for mild pain, moderate pain or fever. 30 tablet 0   albuterol (VENTOLIN HFA) 108 (90 Base) MCG/ACT inhaler Inhale 2 puffs into the lungs every 6 (six) hours as needed. (Patient taking differently: Inhale 2 puffs into the lungs every 6 (six) hours as needed for wheezing or shortness of breath.) 18 g 5   amiodarone (PACERONE) 400 MG tablet Take 1 tablet (400 mg total) by mouth 2 (two) times daily. Through 03/12/22.  Then 200 mg BID x 7 days starting 03/13/22.  Then 200 mg once daily starting 03/20/22 60 tablet 1   ARIPiprazole (ABILIFY) 5 MG tablet Take 5 mg by mouth daily.     ascorbic acid (VITAMIN C) 500 MG tablet Take 1 tablet (500 mg total) by mouth daily. 30 tablet 0   aspirin EC 325 MG tablet Take 325 mg by mouth daily.  calcium acetate (PHOSLO) 667 MG capsule Take 667 mg by mouth in the morning, at noon, and at bedtime.     calcium-vitamin D (OSCAL WITH D) 500-5 MG-MCG tablet Take 1 tablet by mouth 2 (two) times daily.     cefdinir (OMNICEF) 300 MG capsule Take 1 today then 1 take 3 times a week on dialysis days +1 on Sundays do this for 2 weeks 10 capsule 0   doxazosin (CARDURA) 2 MG tablet Take 1 tablet (2 mg total) by mouth daily. 30 tablet 5   EPINEPHrine 0.3 mg/0.3 mL IJ SOAJ injection Inject 0.3 mg into the muscle as needed for anaphylaxis. 1 each 4   hydrALAZINE (APRESOLINE) 50 MG tablet TAKE 1 TABLET BY MOUTH EVERY 8 HOURS 90 tablet 1   isosorbide dinitrate (ISORDIL) 30 MG tablet Take 30 mg by mouth 3 (three) times daily.     lidocaine  (ASPERCREME LIDOCAINE) 4 % Place 1 patch onto the skin daily. Wear for 12 hours then remove, wait 12 hours before applying a new patch to lower back     metoprolol succinate (TOPROL-XL) 50 MG 24 hr tablet Take 1 tablet (50 mg total) by mouth daily. Take with or immediately following a meal. 30 tablet 3   Multiple Vitamins-Minerals (THERATRUM COMPLETE) TABS Take 1 tablet by mouth daily.     nitroGLYCERIN (NITROSTAT) 0.4 MG SL tablet Place 1 tablet (0.4 mg total) under the tongue every 5 (five) minutes as needed for chest pain. 25 tablet 2   Nutritional Supplements (FEEDING SUPPLEMENT, NEPRO CARB STEADY,) LIQD Take 237 mLs by mouth 2 (two) times daily with a meal.     pantoprazole (PROTONIX) 40 MG tablet TAKE 1 TABLET BY MOUTH TWICE DAILY 60 tablet 1   REPATHA SURECLICK 867 MG/ML SOAJ Inject 140 mg as directed every 14 (fourteen) days. (Patient taking differently: Inject 140 mg as directed every 14 (fourteen) days. Starting 02/08/22) 2 mL 11   sertraline (ZOLOFT) 100 MG tablet 1 daily 30 tablet 5   fluconazole (DIFLUCAN) 100 MG tablet Take 1 tablet (100 mg total) by mouth daily. (Patient not taking: Reported on 04/20/2022) 13 tablet 0   Insulin Pen Needle (B-D ULTRAFINE III SHORT PEN) 31G X 8 MM MISC 1 each by Does not apply route as directed. (Patient not taking: Reported on 04/20/2022) 100 each 3   No current facility-administered medications for this visit.    REVIEW OF SYSTEMS:  '[X]'$  denotes positive finding, '[ ]'$  denotes negative finding Cardiac  Comments:  Chest pain or chest pressure:    Shortness of breath upon exertion:    Short of breath when lying flat:    Irregular heart rhythm:        Vascular    Pain in calf, thigh, or hip brought on by ambulation:    Pain in feet at night that wakes you up from your sleep:     Blood clot in your veins:    Leg swelling:           PHYSICAL EXAM: Vitals:   04/20/22 1511  BP: (!) 155/83  Pulse: 80  Temp: 98.2 F (36.8 C)  SpO2: 90%  Weight:  190 lb (86.2 kg)  Height: '5\' 10"'$  (1.778 m)    GENERAL: The patient is a well-nourished male, in no acute distress. The vital signs are documented above. CARDIOVASCULAR: He has a excellent thrill in his upper arm fistula.  The vein runs in a straight course and is of good caliber and  is superficial under the skin.  He does have significant infiltration and bruising in the upper lateral arm. PULMONARY: There is good air exchange  MUSCULOSKELETAL: There are no major deformities or cyanosis. NEUROLOGIC: No focal weakness or paresthesias are detected. SKIN: There are no ulcers or rashes noted. PSYCHIATRIC: The patient has a normal affect.  DATA:  Duplex today shows wide patency of his fistula with no evidence of technical issue.  He does have a branch in the proximal vein above the access site.  MEDICAL ISSUES: I discussed these findings with the patient and his friend present.  I do not see any technical issues with his access.  The vein is of adequate size and is superficial and runs in a straight course.  I feel that he should have a high chance of success with this fistula.  I have recommended allowing this to rest for an additional 2 to 3 weeks to allow resolution of the infiltration and then attempt access again.  If he is unable to use the fistula, his next access would be a left upper arm prosthetic graft.  We will see him on an as-needed basis if he continues to have difficulty with access    Rosetta Posner, MD FACS [facility] [place] Office Tel [phone]  Note: Portions of this report may have been transcribed using voice recognition software.  Every effort has been made to ensure accuracy; however, inadvertent computerized transcription errors may still be present.

## 2022-04-21 ENCOUNTER — Ambulatory Visit (HOSPITAL_BASED_OUTPATIENT_CLINIC_OR_DEPARTMENT_OTHER): Payer: Medicare HMO | Admitting: Cardiovascular Disease

## 2022-04-21 ENCOUNTER — Encounter: Payer: Self-pay | Admitting: *Deleted

## 2022-04-21 ENCOUNTER — Ambulatory Visit: Payer: Self-pay | Admitting: *Deleted

## 2022-04-21 DIAGNOSIS — E1159 Type 2 diabetes mellitus with other circulatory complications: Secondary | ICD-10-CM

## 2022-04-21 DIAGNOSIS — I471 Supraventricular tachycardia, unspecified: Secondary | ICD-10-CM

## 2022-04-21 DIAGNOSIS — I5043 Acute on chronic combined systolic (congestive) and diastolic (congestive) heart failure: Secondary | ICD-10-CM

## 2022-04-21 NOTE — Patient Instructions (Signed)
Visit Information  Thank you for taking time to visit with me today. Please don't hesitate to contact me if I can be of assistance to you.   Following are the goals we discussed today:   Goals Addressed               This Visit's Progress     Receive Assistance Applying for [place]. (pt-stated)   On track     Care Coordination Interventions:  Discussed plans with patient for ongoing care management follow up. Provided patient with direct contact information for care management team. Screening for signs and symptoms of depression related to chronic disease state.  Assessed social determinant of health barriers. Active listening/reflection utilized.  Problem solving/task-centered strategies developed. Quality of sleep assessed and sleep hygiene techniques promoted.  Verbalization of feelings encouraged.  Discussed caregiver resources and support. Mailed the following list of agencies and resources, on 04/21/2022:             [place] Providers Encouraged to review list of agencies and resources provided, and be prepared to discuss during our next scheduled telephone outreach call. Continue to receive home health physical therapy services two days per week, through [facility].   Continue to attend dialysis treatments on Monday's, Wednesday's, and Friday's. CSW collaboration with [place] to place referral for transportation services and resources. ~ In the meantime, continue utilizing transportation services through [employer]) and [employer].       Our next appointment is by telephone on 05/04/2022 at 11:15 am.  Please call the care guide team at [phone] if you need to cancel or reschedule your appointment.   If you are experiencing a  Mental Health or [place] or need someone to talk to, please call the Suicide and Crisis Lifeline: 988 call the [place] National Suicide Prevention Lifeline: 531-572-8547 or TTY: 819-569-5758 TTY 367-434-8328) to talk to a trained counselor call 1-800-273-TALK (toll free, 24 hour hotline) go to [facility] [address] [place] [phone]) call the [place]: (501)[phone] call 911  Patient verbalizes understanding of instructions and care plan provided today and agrees to view in [place]. Active MyChart status and patient understanding of how to access instructions and care plan via MyChart confirmed with patient.     Telephone follow up appointment with care management team member scheduled for:  05/04/2022 at 11:15 am.  [name], BSW, MSW, [place]  Licensed Clinical Social Worker  [place]  Mailing [place]. [address], [place], [place] [address] Physical Address-[address], [place], [place] [address] Toll Free Main # [phone] Fax # [phone] Cell # [phone] [email or web address]

## 2022-04-21 NOTE — Patient Outreach (Signed)
  Care Coordination   Initial Visit Note   04/21/2022  Name: [name] MRN: [id number] DOB: [birth year]  [name] is a 66 y.o. year old male who sees [name], [name], MD for primary care. I spoke with [name] by phone today.  What matters to the patients health and wellness today?  Receive Assistance Applying for [place].    Goals Addressed               This Visit's Progress     Receive Assistance Applying for [place]. (pt-stated)   On track     Care Coordination Interventions:  Discussed plans with patient for ongoing care management follow up. Provided patient with direct contact information for care management team. Screening for signs and symptoms of depression related to chronic disease state.  Assessed social determinant of health barriers. Active listening/reflection utilized.  Problem solving/task-centered strategies developed. Quality of sleep assessed and sleep hygiene techniques promoted.  Verbalization of feelings encouraged.  Discussed caregiver resources and support. Mailed the following list of agencies and resources, on 04/21/2022:             [place] Providers Encouraged to review list of agencies and resources provided, and be prepared to discuss during our next scheduled telephone outreach call. Continue to receive home health physical therapy services two days per week, through [facility].   Continue to attend dialysis treatments on Monday's, Wednesday's, and Friday's. CSW collaboration with [place] to place referral for transportation services and resources. ~ In the meantime, continue utilizing transportation services through [employer] engineer) and Architect.         [name]  assessments and interventions completed:  Yes.  [name] Interventions Today    Flowsheet Row Most Recent Value  [name] Interventions   Food Insecurity Interventions Intervention Not Indicated  Housing Interventions Intervention Not Indicated  Transportation Interventions Patient Resources (Friends/Family), Other (Comment), Intervention Not Indicated  [RCATS & Safe Hands]  Utilities Interventions Intervention Not Indicated  Alcohol Usage Interventions Intervention Not Indicated (Score <7)  Financial Strain Interventions Intervention Not Indicated  Physical Activity Interventions Patient Refused  Stress Interventions Intervention Not Indicated, Offered [employer], Provide Counseling  Social Connections Interventions Intervention Not Indicated     Care Coordination Interventions Activated:  Yes.   Care Coordination Interventions:  Yes, provided.   Follow up plan: Follow up call scheduled for 05/04/2022 at 11:15 am.  Encounter Outcome:  Pt. Visit Completed.   [name], BSW, MSW, LCSW  Licensed Education officer, [facility]  Mailing [place] [address] [place], [place] [address] Physical Address-[address], [place], [place] [address] Toll Free Main # [phone] Fax # [phone] Cell # [phone] [email or web address]

## 2022-04-22 ENCOUNTER — Telehealth: Payer: Self-pay

## 2022-04-22 DIAGNOSIS — N2581 Secondary hyperparathyroidism of renal origin: Secondary | ICD-10-CM | POA: Diagnosis not present

## 2022-04-22 DIAGNOSIS — Z992 Dependence on renal dialysis: Secondary | ICD-10-CM | POA: Diagnosis not present

## 2022-04-22 DIAGNOSIS — N186 End stage renal disease: Secondary | ICD-10-CM | POA: Diagnosis not present

## 2022-04-22 NOTE — Telephone Encounter (Signed)
   Telephone encounter was:  Unsuccessful.  04/22/2022 Name: [name] MRN: [id number] DOB: 01/31/1956  Unsuccessful outbound call made today to assist with:  Transportation Needs   Outreach Attempt:  1st Attempt  A HIPAA compliant voice message was left requesting a return call.  Instructed patient to call back     [place], [place] [facility]  [phone] [address], [place], [place] [address] Phone: [phone] Email: [email or web address]

## 2022-04-23 IMAGING — DX DG CHEST 1V PORT
1 series · 1 of 1 positions shown · non-contrast
Comparison: 07/20/2021

CLINICAL DATA: Shortness of breath and weakness over the last
several weeks.

EXAM:
PORTABLE CHEST 1 VIEW

[chest ap]
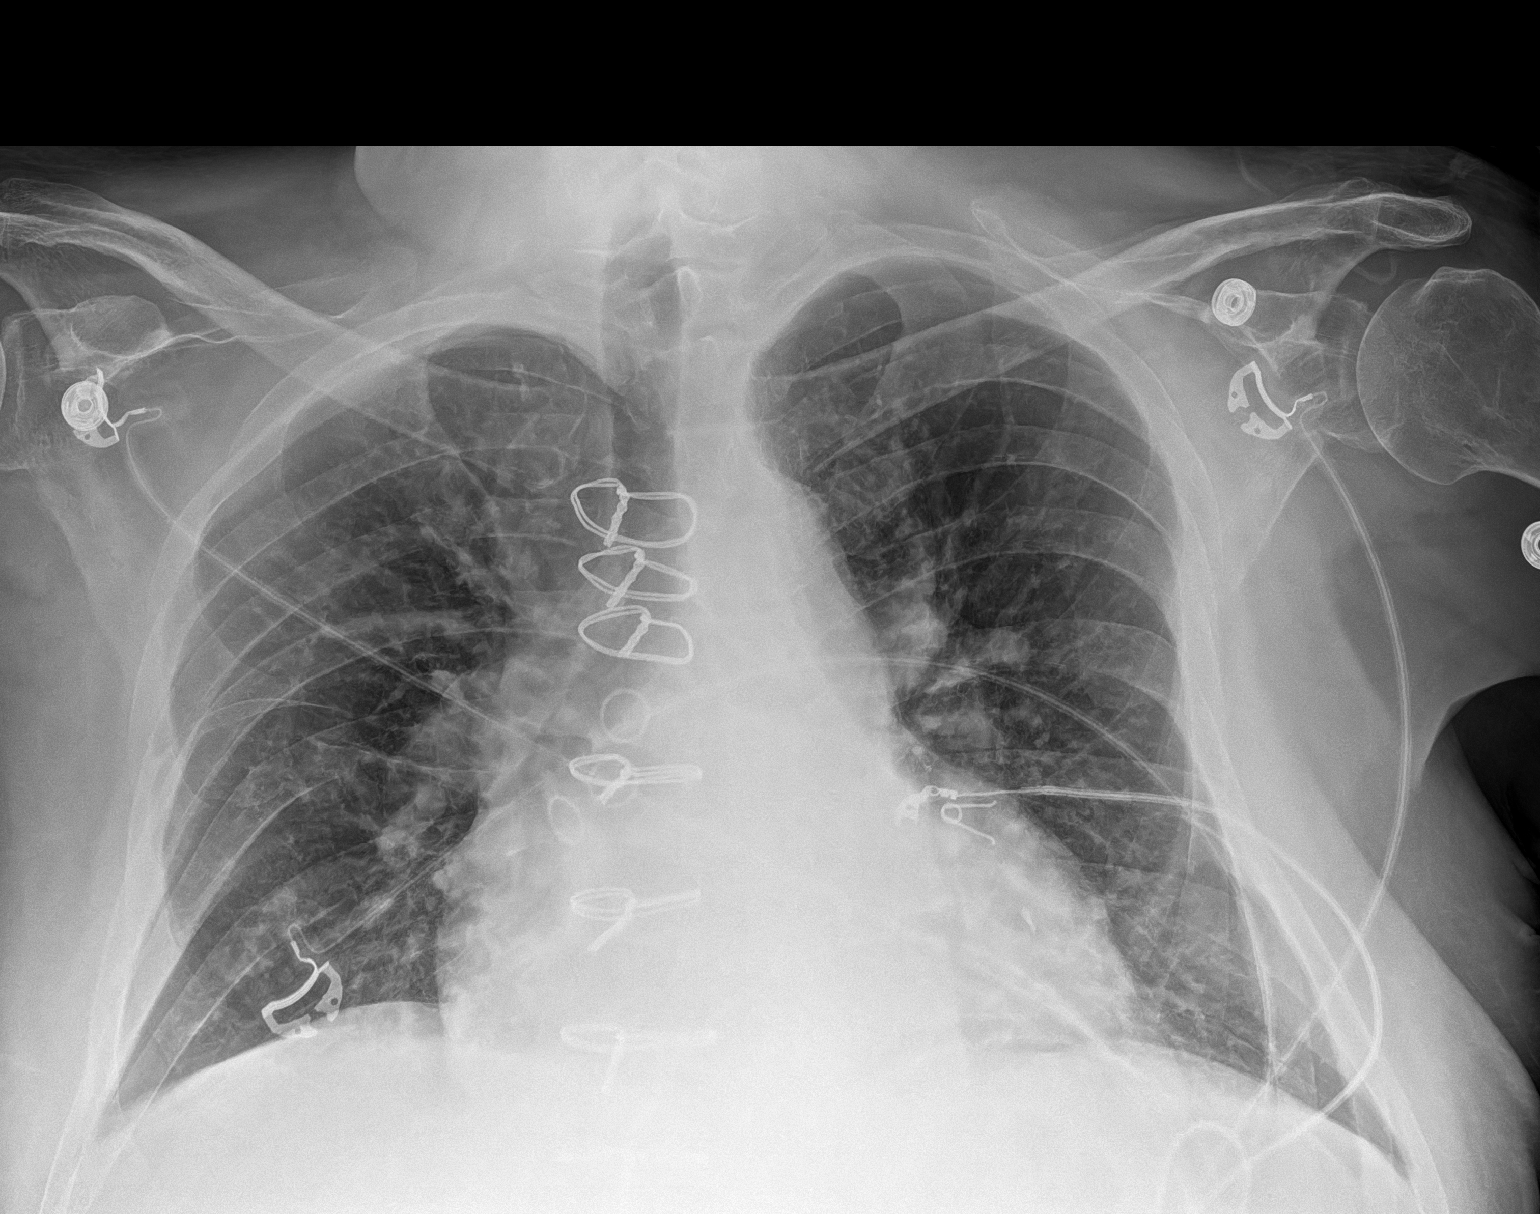

[1 of 1 positions shown; findings below may reference images not displayed]

FINDINGS: Artifact overlies the chest. Previous median sternotomy and CABG.
Mild cardiomegaly. No evidence of heart failure or effusion. The
lungs are clear, allowing for a slightly suboptimal inspiration.
IMPRESSION: Previous CABG.  Cardiomegaly.  No active disease suspected.

## 2022-04-24 ENCOUNTER — Encounter (INDEPENDENT_AMBULATORY_CARE_PROVIDER_SITE_OTHER): Payer: Self-pay | Admitting: Gastroenterology

## 2022-04-25 ENCOUNTER — Telehealth: Payer: Self-pay

## 2022-04-25 DIAGNOSIS — N186 End stage renal disease: Secondary | ICD-10-CM | POA: Diagnosis not present

## 2022-04-25 DIAGNOSIS — N2581 Secondary hyperparathyroidism of renal origin: Secondary | ICD-10-CM | POA: Diagnosis not present

## 2022-04-25 DIAGNOSIS — Z992 Dependence on renal dialysis: Secondary | ICD-10-CM | POA: Diagnosis not present

## 2022-04-25 NOTE — Telephone Encounter (Signed)
   Telephone encounter was:  Unsuccessful.  04/25/2022 Name: [name] MRN: [id number] DOB: [birth year]  Unsuccessful outbound call made today to assist with:  Transportation Needs   Outreach Attempt:  2nd Attempt  A HIPAA compliant voice message was left requesting a return call.  Instructed patient to call back .    [place], Care Management  [phone] [address], [place], [place] [address] Phone: [phone] Email: [email or web address]

## 2022-04-26 NOTE — Telephone Encounter (Signed)
   Telephone encounter was:  Unsuccessful.  04/26/2022 Name: [name] MRN: [id number] DOB: 1955-07-26  Unsuccessful outbound call made today to assist with:  Transportation Needs   Outreach Attempt:  3rd Attempt.  Referral closed unable to contact patient.  A HIPAA compliant voice message was left requesting a return call.  Instructed patient to call back    [place], [place] Management  [phone] [address], [place], [place] [address] Phone: [phone] Email: [email or web address]

## 2022-04-27 DIAGNOSIS — N186 End stage renal disease: Secondary | ICD-10-CM | POA: Diagnosis not present

## 2022-04-27 DIAGNOSIS — N2581 Secondary hyperparathyroidism of renal origin: Secondary | ICD-10-CM | POA: Diagnosis not present

## 2022-04-27 DIAGNOSIS — Z992 Dependence on renal dialysis: Secondary | ICD-10-CM | POA: Diagnosis not present

## 2022-04-28 ENCOUNTER — Telehealth: Payer: Self-pay | Admitting: Family Medicine

## 2022-04-28 NOTE — Telephone Encounter (Signed)
So I would recommend following the blood pressure his blood pressure can be volatile meaning that it can move up and down I would only want to adjust medicines if this is a trend, he has an office visit with [place] on the 21st

## 2022-04-28 NOTE — Telephone Encounter (Signed)
Mark M-PT with [facility] called in and states that patient blood pressure was 160/80 and pt felt nauseated when he woke up. All other vitals were good. Please advise. Thank you

## 2022-04-29 DIAGNOSIS — Z992 Dependence on renal dialysis: Secondary | ICD-10-CM | POA: Diagnosis not present

## 2022-04-29 DIAGNOSIS — N2581 Secondary hyperparathyroidism of renal origin: Secondary | ICD-10-CM | POA: Diagnosis not present

## 2022-04-29 DIAGNOSIS — N186 End stage renal disease: Secondary | ICD-10-CM | POA: Diagnosis not present

## 2022-04-29 NOTE — Telephone Encounter (Signed)
Pt contacted and verbalized understanding.  

## 2022-05-01 DIAGNOSIS — Z992 Dependence on renal dialysis: Secondary | ICD-10-CM | POA: Diagnosis not present

## 2022-05-01 DIAGNOSIS — N186 End stage renal disease: Secondary | ICD-10-CM | POA: Diagnosis not present

## 2022-05-01 DIAGNOSIS — N2581 Secondary hyperparathyroidism of renal origin: Secondary | ICD-10-CM | POA: Diagnosis not present

## 2022-05-03 ENCOUNTER — Inpatient Hospital Stay (HOSPITAL_COMMUNITY)
Admission: EM | Admit: 2022-05-03 | Discharge: 2022-05-05 | DRG: 291 | Disposition: A | Discharge status: Home Health | Payer: Medicare HMO | Attending: Family Medicine | Admitting: Family Medicine

## 2022-05-03 ENCOUNTER — Other Ambulatory Visit: Payer: Self-pay

## 2022-05-03 ENCOUNTER — Ambulatory Visit: Payer: Medicare HMO | Admitting: Family Medicine

## 2022-05-03 ENCOUNTER — Emergency Department (HOSPITAL_COMMUNITY): Payer: Medicare HMO

## 2022-05-03 ENCOUNTER — Encounter (HOSPITAL_COMMUNITY): Payer: Self-pay | Admitting: Emergency Medicine

## 2022-05-03 DIAGNOSIS — I132 Hypertensive heart and chronic kidney disease with heart failure and with stage 5 chronic kidney disease, or end stage renal disease: Secondary | ICD-10-CM | POA: Diagnosis not present

## 2022-05-03 DIAGNOSIS — C159 Malignant neoplasm of esophagus, unspecified: Secondary | ICD-10-CM

## 2022-05-03 DIAGNOSIS — Z66 Do not resuscitate: Secondary | ICD-10-CM

## 2022-05-03 DIAGNOSIS — E8779 Other fluid overload: Secondary | ICD-10-CM | POA: Diagnosis not present

## 2022-05-03 DIAGNOSIS — Z7982 Long term (current) use of aspirin: Secondary | ICD-10-CM | POA: Diagnosis not present

## 2022-05-03 DIAGNOSIS — Z888 Allergy status to other drugs, medicaments and biological substances status: Secondary | ICD-10-CM

## 2022-05-03 DIAGNOSIS — R1319 Other dysphagia: Secondary | ICD-10-CM

## 2022-05-03 DIAGNOSIS — E1151 Type 2 diabetes mellitus with diabetic peripheral angiopathy without gangrene: Secondary | ICD-10-CM

## 2022-05-03 DIAGNOSIS — R0689 Other abnormalities of breathing: Secondary | ICD-10-CM | POA: Diagnosis not present

## 2022-05-03 DIAGNOSIS — N250 Renal osteodystrophy: Secondary | ICD-10-CM | POA: Diagnosis not present

## 2022-05-03 DIAGNOSIS — Z87891 Personal history of nicotine dependence: Secondary | ICD-10-CM

## 2022-05-03 DIAGNOSIS — M1990 Unspecified osteoarthritis, unspecified site: Secondary | ICD-10-CM | POA: Diagnosis present

## 2022-05-03 DIAGNOSIS — R110 Nausea: Secondary | ICD-10-CM | POA: Diagnosis not present

## 2022-05-03 DIAGNOSIS — E119 Type 2 diabetes mellitus without complications: Secondary | ICD-10-CM | POA: Diagnosis present

## 2022-05-03 DIAGNOSIS — Z79899 Other long term (current) drug therapy: Secondary | ICD-10-CM | POA: Diagnosis not present

## 2022-05-03 DIAGNOSIS — D638 Anemia in other chronic diseases classified elsewhere: Secondary | ICD-10-CM

## 2022-05-03 DIAGNOSIS — B9689 Other specified bacterial agents as the cause of diseases classified elsewhere: Secondary | ICD-10-CM | POA: Diagnosis present

## 2022-05-03 DIAGNOSIS — J9811 Atelectasis: Secondary | ICD-10-CM | POA: Diagnosis not present

## 2022-05-03 DIAGNOSIS — H5461 Unqualified visual loss, right eye, normal vision left eye: Secondary | ICD-10-CM | POA: Diagnosis present

## 2022-05-03 DIAGNOSIS — E1159 Type 2 diabetes mellitus with other circulatory complications: Secondary | ICD-10-CM

## 2022-05-03 DIAGNOSIS — I5043 Acute on chronic combined systolic (congestive) and diastolic (congestive) heart failure: Secondary | ICD-10-CM

## 2022-05-03 DIAGNOSIS — E876 Hypokalemia: Secondary | ICD-10-CM | POA: Diagnosis not present

## 2022-05-03 DIAGNOSIS — Z833 Family history of diabetes mellitus: Secondary | ICD-10-CM

## 2022-05-03 DIAGNOSIS — N2581 Secondary hyperparathyroidism of renal origin: Secondary | ICD-10-CM | POA: Diagnosis not present

## 2022-05-03 DIAGNOSIS — N186 End stage renal disease: Secondary | ICD-10-CM | POA: Diagnosis not present

## 2022-05-03 DIAGNOSIS — I252 Old myocardial infarction: Secondary | ICD-10-CM | POA: Diagnosis not present

## 2022-05-03 DIAGNOSIS — I10 Essential (primary) hypertension: Secondary | ICD-10-CM

## 2022-05-03 DIAGNOSIS — I130 Hypertensive heart and chronic kidney disease with heart failure and stage 1 through stage 4 chronic kidney disease, or unspecified chronic kidney disease: Secondary | ICD-10-CM

## 2022-05-03 DIAGNOSIS — Z1152 Encounter for screening for COVID-19: Secondary | ICD-10-CM

## 2022-05-03 DIAGNOSIS — Z91030 Bee allergy status: Secondary | ICD-10-CM

## 2022-05-03 DIAGNOSIS — M898X9 Other specified disorders of bone, unspecified site: Secondary | ICD-10-CM | POA: Diagnosis present

## 2022-05-03 DIAGNOSIS — E8770 Fluid overload, unspecified: Secondary | ICD-10-CM | POA: Diagnosis not present

## 2022-05-03 DIAGNOSIS — Z8249 Family history of ischemic heart disease and other diseases of the circulatory system: Secondary | ICD-10-CM | POA: Diagnosis not present

## 2022-05-03 DIAGNOSIS — I25118 Atherosclerotic heart disease of native coronary artery with other forms of angina pectoris: Secondary | ICD-10-CM | POA: Diagnosis present

## 2022-05-03 DIAGNOSIS — I25708 Atherosclerosis of coronary artery bypass graft(s), unspecified, with other forms of angina pectoris: Secondary | ICD-10-CM

## 2022-05-03 DIAGNOSIS — Z8501 Personal history of malignant neoplasm of esophagus: Secondary | ICD-10-CM

## 2022-05-03 DIAGNOSIS — I491 Atrial premature depolarization: Secondary | ICD-10-CM | POA: Diagnosis not present

## 2022-05-03 DIAGNOSIS — G4733 Obstructive sleep apnea (adult) (pediatric): Secondary | ICD-10-CM

## 2022-05-03 DIAGNOSIS — J9601 Acute respiratory failure with hypoxia: Secondary | ICD-10-CM | POA: Diagnosis not present

## 2022-05-03 DIAGNOSIS — F32A Depression, unspecified: Secondary | ICD-10-CM | POA: Diagnosis present

## 2022-05-03 DIAGNOSIS — I5040 Unspecified combined systolic (congestive) and diastolic (congestive) heart failure: Secondary | ICD-10-CM | POA: Diagnosis not present

## 2022-05-03 DIAGNOSIS — D631 Anemia in chronic kidney disease: Secondary | ICD-10-CM | POA: Diagnosis not present

## 2022-05-03 DIAGNOSIS — I5042 Chronic combined systolic (congestive) and diastolic (congestive) heart failure: Secondary | ICD-10-CM | POA: Diagnosis present

## 2022-05-03 DIAGNOSIS — Z794 Long term (current) use of insulin: Secondary | ICD-10-CM | POA: Diagnosis not present

## 2022-05-03 DIAGNOSIS — I2510 Atherosclerotic heart disease of native coronary artery without angina pectoris: Secondary | ICD-10-CM

## 2022-05-03 DIAGNOSIS — Z992 Dependence on renal dialysis: Secondary | ICD-10-CM | POA: Diagnosis not present

## 2022-05-03 DIAGNOSIS — E1122 Type 2 diabetes mellitus with diabetic chronic kidney disease: Secondary | ICD-10-CM | POA: Diagnosis present

## 2022-05-03 DIAGNOSIS — R0602 Shortness of breath: Secondary | ICD-10-CM | POA: Diagnosis not present

## 2022-05-03 DIAGNOSIS — Z8673 Personal history of transient ischemic attack (TIA), and cerebral infarction without residual deficits: Secondary | ICD-10-CM

## 2022-05-03 DIAGNOSIS — R000 Tachycardia, unspecified: Secondary | ICD-10-CM | POA: Diagnosis not present

## 2022-05-03 DIAGNOSIS — R7989 Other specified abnormal findings of blood chemistry: Secondary | ICD-10-CM | POA: Diagnosis not present

## 2022-05-03 DIAGNOSIS — R1310 Dysphagia, unspecified: Secondary | ICD-10-CM

## 2022-05-03 DIAGNOSIS — J90 Pleural effusion, not elsewhere classified: Secondary | ICD-10-CM | POA: Diagnosis not present

## 2022-05-03 DIAGNOSIS — J9621 Acute and chronic respiratory failure with hypoxia: Secondary | ICD-10-CM | POA: Diagnosis present

## 2022-05-03 DIAGNOSIS — R7881 Bacteremia: Secondary | ICD-10-CM | POA: Diagnosis present

## 2022-05-03 DIAGNOSIS — I959 Hypotension, unspecified: Secondary | ICD-10-CM | POA: Diagnosis not present

## 2022-05-03 LAB — COMPREHENSIVE METABOLIC PANEL
ALT: 15 U/L (ref 0–44)
AST: 16 U/L (ref 15–41)
Albumin: 3.5 g/dL (ref 3.5–5.0)
Alkaline Phosphatase: 131 U/L — ABNORMAL HIGH (ref 38–126)
Anion gap: 13 (ref 5–15)
BUN: 21 mg/dL (ref 8–23)
CO2: 27 mmol/L (ref 22–32)
Calcium: 8.7 mg/dL — ABNORMAL LOW (ref 8.9–10.3)
Chloride: 98 mmol/L (ref 98–111)
Creatinine, Ser: 2.88 mg/dL — ABNORMAL HIGH (ref 0.61–1.24)
GFR, Estimated: 23 mL/min — ABNORMAL LOW (ref >60)
Glucose, Bld: 116 mg/dL — ABNORMAL HIGH (ref 70–99)
Potassium: 3.2 mmol/L — ABNORMAL LOW (ref 3.5–5.1)
Sodium: 138 mmol/L (ref 135–145)
Total Bilirubin: 1.4 mg/dL — ABNORMAL HIGH (ref 0.3–1.2)
Total Protein: 6.7 g/dL (ref 6.5–8.1)

## 2022-05-03 LAB — CBC WITH DIFFERENTIAL/PLATELET
Abs Immature Granulocytes: 0.05 10*3/uL (ref 0.00–0.07)
Basophils Absolute: 0.1 10*3/uL (ref 0.0–0.1)
Basophils Relative: 1 %
Eosinophils Absolute: 0.1 10*3/uL (ref 0.0–0.5)
Eosinophils Relative: 1 %
HCT: 31.8 % — ABNORMAL LOW (ref 39.0–52.0)
Hemoglobin: 9.9 g/dL — ABNORMAL LOW (ref 13.0–17.0)
Immature Granulocytes: 1 %
Lymphocytes Relative: 2 %
Lymphs Abs: 0.2 10*3/uL — ABNORMAL LOW (ref 0.7–4.0)
MCH: 27 pg (ref 26.0–34.0)
MCHC: 31.1 g/dL (ref 30.0–36.0)
MCV: 86.6 fL (ref 80.0–100.0)
Monocytes Absolute: 0.3 10*3/uL (ref 0.1–1.0)
Monocytes Relative: 3 %
Neutro Abs: 9.5 10*3/uL — ABNORMAL HIGH (ref 1.7–7.7)
Neutrophils Relative %: 92 %
Platelets: 206 10*3/uL (ref 150–400)
RBC: 3.67 MIL/uL — ABNORMAL LOW (ref 4.22–5.81)
RDW: 18.9 % — ABNORMAL HIGH (ref 11.5–15.5)
WBC: 10.1 10*3/uL (ref 4.0–10.5)
nRBC: 0 % (ref 0.0–0.2)

## 2022-05-03 LAB — LACTIC ACID, PLASMA
Lactic Acid, Venous: 1.1 mmol/L (ref 0.5–1.9)
Lactic Acid, Venous: 1.2 mmol/L (ref 0.5–1.9)

## 2022-05-03 LAB — D-DIMER, QUANTITATIVE: D-Dimer, Quant: 2.26 ug/mL-FEU — ABNORMAL HIGH (ref 0.00–0.50)

## 2022-05-03 LAB — HIV ANTIBODY (ROUTINE TESTING W REFLEX): HIV Screen 4th Generation wRfx: NONREACTIVE

## 2022-05-03 LAB — BRAIN NATRIURETIC PEPTIDE: B Natriuretic Peptide: >4500 pg/mL — ABNORMAL HIGH (ref 0.0–100.0)

## 2022-05-03 LAB — RESP PANEL BY RT-PCR (FLU A&B, COVID) ARPGX2
Influenza A by PCR: NEGATIVE
Influenza B by PCR: NEGATIVE
SARS Coronavirus 2 by RT PCR: NEGATIVE

## 2022-05-03 MED ORDER — IOHEXOL 350 MG/ML SOLN
75.0000 mL | Freq: Once | INTRAVENOUS | Status: AC | PRN
  Administered 2022-05-03: 75 mL via INTRAVENOUS

## 2022-05-03 MED ORDER — ALBUTEROL SULFATE (2.5 MG/3ML) 0.083% IN NEBU: 2.5000 mg | INHALATION_SOLUTION | Freq: Four times a day (QID) | RESPIRATORY_TRACT | Status: DC | PRN

## 2022-05-03 MED ORDER — INSULIN ASPART 100 UNIT/ML IJ SOLN
0.0000 [IU] | Freq: Three times a day (TID) | INTRAMUSCULAR | Status: DC
  Administered 2022-05-04 – 2022-05-05 (×3): 2 [IU] via SUBCUTANEOUS

## 2022-05-03 MED ORDER — POTASSIUM CHLORIDE CRYS ER 20 MEQ PO TBCR
10.0000 meq | EXTENDED_RELEASE_TABLET | Freq: Once | ORAL | Status: AC
  Administered 2022-05-03: 10 meq via ORAL
  Filled 2022-05-03: qty 1

## 2022-05-03 MED ORDER — FUROSEMIDE 10 MG/ML IJ SOLN
80.0000 mg | Freq: Once | INTRAMUSCULAR | Status: AC
  Administered 2022-05-03: 80 mg via INTRAVENOUS
  Filled 2022-05-03: qty 8

## 2022-05-03 MED ORDER — SODIUM CHLORIDE 0.9% FLUSH
3.0000 mL | Freq: Two times a day (BID) | INTRAVENOUS | Status: DC
  Administered 2022-05-03 – 2022-05-05 (×4): 3 mL via INTRAVENOUS

## 2022-05-03 MED ORDER — ACETAMINOPHEN 325 MG PO TABS: 650.0000 mg | ORAL_TABLET | Freq: Four times a day (QID) | ORAL | Status: DC | PRN

## 2022-05-03 MED ORDER — ACETAMINOPHEN 650 MG RE SUPP: 650.0000 mg | Freq: Four times a day (QID) | RECTAL | Status: DC | PRN

## 2022-05-03 MED ORDER — HEPARIN SODIUM (PORCINE) 5000 UNIT/ML IJ SOLN
5000.0000 [IU] | Freq: Three times a day (TID) | INTRAMUSCULAR | Status: DC
  Administered 2022-05-03 – 2022-05-05 (×4): 5000 [IU] via SUBCUTANEOUS
  Filled 2022-05-03 (×4): qty 1

## 2022-05-03 NOTE — H&P (Addendum)
History and Physical    Patient: [name] UJW:[id number] DOB: 09/12/1955 DOA: 05/03/2022 DOS: the patient was seen and examined on 05/03/2022 PCP: [name], MD  Patient coming from: Hemodialysis via EMS  Chief Complaint: Nausea  HPI: [name] is a 66 y.o. male with medical history significant of hypertension, CAD s/p CABG, combined systolic and diastolic CHF, SVT, CVA, esophageal adenocarcinoma, diabetes mellitus type II, presents with acute onset of nausea and shortness of breath.  Due to the holiday weekend and his hemodialysis schedule had gotten changed from Monday/Wednesday/Friday and he had been scheduled for hemodialysis this morning.  He had eaten some of his breakfast this morning and vomited.  Emesis was just of food contents and nonbloody in appearance.  Thereafter patient reported feeling okay and his wife took him to hemodialysis.  He was on the machine for approximately 3 hours and developed nausea again.  He was noted to be hypertensive at the time and O2 saturations dropped down to as low as 88% on room air.  Normally patient is not on oxygen at baseline.  He denied having any recent fever, chest pain, headache, dizziness, abdominal pain, diarrhea, and he did not vomit again.  He has been on antibiotics for treatment of a right toe wound that he sustained.  Denies having any redness or erythema from the wound.  He is supposed to be set up to see podiatry in the outpatient setting.    Last hospitalized 9/18-9/26 for acute respiratory failure hypoxia while receiving hemodialysis patient was noted to have a large right-sided pleural effusion for which patient underwent thoracentesis removing 1.3 L of fluid.  In the emergency department patient was noted to be tachypneic with blood pressures 120/56-166/83, and O2 saturations currently maintained on 3 L of nasal cannula oxygen.  Labs noted hemoglobin 9.9, potassium 3.2, BUN 21, creatinine 2.88, [place] phosphatase  131, total bilirubin 1.4, D-dimer 2.26, and BNP > 4500.  CT angiogram of the chest was done which noted no overt pulmonary embolism, cardiomegaly, small right pleural effusion, and findings suspicious for early/developing pulmonary edema.  Review of Systems: As mentioned in the history of present illness. All other systems reviewed and are negative. Past Medical History:  Diagnosis Date   Anemia    Arthritis    hips shoulders   CAD (coronary artery disease) 03/03/2019   Cancer ([place])    Esophageal cancer 2022   CHF (congestive heart failure) ([place]) 2020   Chronic combined systolic and diastolic heart failure ([place]) 04/26/2018   Admitted 11/14-11/20/19-diuresed 10L   Depression 05/31/2021   Fatigue 10/14/2020   Hypertension    Myocardial infarction [facility])    Sleep apnea    uses a bipap machine   Stroke ([place]) 12/14/2020   no last weakness or paralysis   Type 2 diabetes mellitus with diabetic nephropathy ([place]) 04/23/2019   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   Past Surgical History:  Procedure Laterality Date   AV FISTULA PLACEMENT Left 12/21/2021   Procedure: LEFT ARM ARTERIOVENOUS (AV) FISTULA CREATION;  Surgeon: Rosetta Posner, MD;  Location: [facility];  Service: Vascular;  Laterality: Left;   BIOPSY  06/27/2019   Procedure: BIOPSY;  Surgeon: Rogene Houston, MD;  [facility];  Service: Endoscopy;;  ascending colon polyp   BIOPSY  10/21/2020   Procedure: BIOPSY;  Surgeon: Rogene [place], MD;  [facility];  Service: Endoscopy;;  esophageal,gastric polyp   BIOPSY  02/24/2021   Procedure:  BIOPSY;  Surgeon: Rogene Houston, MD;  [facility];  Service: Endoscopy;;  distal and proximal esophageal biopsies    CARDIAC SURGERY     CATARACT EXTRACTION     COLONOSCOPY N/A 06/27/2019   [name]:Diverticulosis in the entire examined colon. tubular adenoma in ascending colon, 70m tubular adenoma in prox sigmoid. external hemorrhoids   CORONARY ARTERY BYPASS GRAFT  N/A 03/25/2019   Procedure: CORONARY ARTERY BYPASS GRAFTING (CABG) X  4 USING LEFT INTERNAL MAMMARY ARTERY AND RIGHT SAPHENOUS VEIN GRAFTS;  Surgeon: LLajuana Matte MD;  [place]  Service: Open Heart Surgery;  Laterality: N/A;   ESOPHAGEAL BRUSHING  03/08/2022   Procedure: ESOPHAGEAL BRUSHING;  Surgeon: CEloise Harman DO;  [facility];  Service: Endoscopy;;   ESOPHAGOGASTRODUODENOSCOPY (EGD) WITH PROPOFOL N/A 10/21/2020   rehman:Normal hypopharynx.Normal proximal esophagus and mid esophagus.Esophageal mucosal changes consistent with long-segment Barrett's esophagus. (Focal low-grade dysplasia and atypia proximally) z line irregular 37 cm from incisors, 3 cm HH, single gastric polyp (fundic gland) normal duodenal bulb/second portion of duodenum, proximal margin of Barrett's at 34 cm   ESOPHAGOGASTRODUODENOSCOPY (EGD) WITH PROPOFOL N/A 02/24/2021   Procedure: ESOPHAGOGASTRODUODENOSCOPY (EGD) WITH PROPOFOL;  Surgeon: RRogene Houston MD;  [facility];  Service: Endoscopy;  Laterality: N/A;  1:35   ESOPHAGOGASTRODUODENOSCOPY (EGD) WITH PROPOFOL N/A 03/21/2021   Procedure: ESOPHAGOGASTRODUODENOSCOPY (EGD) WITH PROPOFOL;  Surgeon: HCarol Ada MD;  [place]  Service: Endoscopy;  Laterality: N/A;   ESOPHAGOGASTRODUODENOSCOPY (EGD) WITH PROPOFOL N/A 10/23/2021   Procedure: ESOPHAGOGASTRODUODENOSCOPY (EGD) WITH PROPOFOL;  Surgeon: DDoran Stabler MD;  [place] [facility]  Service: Gastroenterology;  Laterality: N/A;   ESOPHAGOGASTRODUODENOSCOPY (EGD) WITH PROPOFOL N/A 03/08/2022   Procedure: ESOPHAGOGASTRODUODENOSCOPY (EGD) WITH PROPOFOL;  Surgeon: CEloise Harman DO;  [facility];  Service: Endoscopy;  Laterality: N/A;   EXCISION MORTON'S NEUROMA     EYE SURGERY Left    retina   INCISION AND DRAINAGE OF WOUND Right 06/28/2021   Procedure: IRRIGATION AND DEBRIDEMENT WOUND;  Surgeon: PCindra Presume MD;  [place] [facility]  Service:  Plastics;  Laterality: Right;  1.5 hour   INSERTION OF DIALYSIS CATHETER Right 12/21/2021   Procedure: INSERTION OF TUNNELED DIALYSIS CATHETER;  Surgeon: ERosetta Posner MD;  [facility];  Service: Vascular;  Laterality: Right;   LEFT HEART CATH AND CORS/GRAFTS ANGIOGRAPHY N/A 02/01/2022   Procedure: LEFT HEART CATH AND CORS/GRAFTS ANGIOGRAPHY;  Surgeon: JMartinique Peter M, MD;  [facility];  Service: Cardiovascular;  Laterality: N/A;   POLYPECTOMY  06/27/2019   Procedure: POLYPECTOMY;  Surgeon: RRogene Houston MD;  [facility];  Service: Endoscopy;;  proximal sigmoid colon   RIGHT/LEFT HEART CATH AND CORONARY ANGIOGRAPHY N/A 03/12/2019   Procedure: RIGHT/LEFT HEART CATH AND CORONARY ANGIOGRAPHY;  Surgeon: VJettie Booze MD;  [place];  Service: Cardiovascular;  Laterality: N/A;   SHOULDER ARTHROSCOPY WITH ROTATOR CUFF REPAIR AND SUBACROMIAL DECOMPRESSION Right 08/26/2020   Procedure: RIGHT SHOULDER MINI OPEN ROTATOR CUFF REPAIR AND SUBACROMIAL DECOMPRESSION WITH PATCH GRAFT;  Surgeon: BSusa Day MD;  [facility];  Service: Orthopedics;  Laterality: Right;  90 MINS GENERAL WITH BLOCK   SKIN SPLIT GRAFT Right 06/28/2021   Procedure: SKIN GRAFT SPLIT THICKNESS;  Surgeon: PCindra Presume MD;  [place] [facility]  Service: Plastics;  Laterality: Right;   TEE WITHOUT CARDIOVERSION N/A 03/25/2019   Procedure: TRANSESOPHAGEAL ECHOCARDIOGRAM (TEE);  Surgeon: LKipp Brood  Lucile Crater, MD;  Location: [place];  Service: Open Heart Surgery;  Laterality: N/A;   Social History:  reports that he quit smoking about 33 years ago. His smoking use included cigarettes. He has been exposed to tobacco smoke. He has never used smokeless tobacco. He reports that he does not currently use alcohol after a past usage of about 2.0 standard drinks of alcohol per week. He reports that he does not use drugs.  Allergies  Allergen Reactions   Bee Venom Anaphylaxis    Atorvastatin Other (See Comments)    myalgia   Diltiazem Itching   Reglan [Metoclopramide] Other (See Comments)    Suicidal    Rosuvastatin Other (See Comments)    myalgias   Valsartan Itching    Family History  Problem Relation Age of Onset   Colon cancer Mother    Heart Problems Father    Diabetes Father    Valvular heart disease Father    Sleep apnea Neg Hx    Stroke Neg Hx     Prior to Admission medications   Medication Sig Start Date End Date Taking? Authorizing Provider  acetaminophen (TYLENOL) 325 MG tablet Take 2 tablets (650 mg total) by mouth every 6 (six) hours as needed for mild pain, moderate pain or fever. 08/02/21   Allie Bossier, MD  albuterol (VENTOLIN HFA) 108 (90 Base) MCG/ACT inhaler Inhale 2 puffs into the lungs every 6 (six) hours as needed. Patient taking differently: Inhale 2 puffs into the lungs every 6 (six) hours as needed for wheezing or shortness of breath. 06/24/21   Coral Spikes, DO  amiodarone (PACERONE) 400 MG tablet Take 1 tablet (400 mg total) by mouth 2 (two) times daily. Through 03/12/22.  Then 200 mg BID x 7 days starting 03/13/22.  Then 200 mg once daily starting 03/20/22 03/08/22   Orson Eva, MD  ARIPiprazole (ABILIFY) 5 MG tablet Take 5 mg by mouth daily.    [provider]  ascorbic acid (VITAMIN C) 500 MG tablet Take 1 tablet (500 mg total) by mouth daily. 08/03/21   Allie Bossier, MD  aspirin EC 325 MG tablet Take 325 mg by mouth daily. 02/21/22   [provider]  calcium acetate (PHOSLO) 667 MG capsule Take 667 mg by mouth in the morning, at noon, and at bedtime.    [provider]  calcium-vitamin D (OSCAL WITH D) 500-5 MG-MCG tablet Take 1 tablet by mouth 2 (two) times daily.    [provider]  cefdinir (OMNICEF) 300 MG capsule Take 1 today then 1 take 3 times a week on dialysis days +1 on Sundays do this for 2 weeks 04/12/22   [name], MD  doxazosin (CARDURA) 2 MG tablet Take 1 tablet (2 mg  total) by mouth daily. 03/15/22   [name], MD  EPINEPHrine 0.3 mg/0.3 mL IJ SOAJ injection Inject 0.3 mg into the muscle as needed for anaphylaxis. 01/25/21   [name], MD  fluconazole (DIFLUCAN) 100 MG tablet Take 1 tablet (100 mg total) by mouth daily. Patient not taking: Reported on 04/20/2022 03/09/22   Orson Eva, MD  hydrALAZINE (APRESOLINE) 50 MG tablet TAKE 1 TABLET BY MOUTH EVERY 8 HOURS 04/13/22   [name], [name], MD  Insulin Pen Needle (B-D ULTRAFINE III SHORT PEN) 31G X 8 MM MISC 1 each by Does not apply route as directed. Patient not taking: Reported on 04/20/2022 05/30/19   Cassandria Anger, MD  isosorbide dinitrate (  ISORDIL) 30 MG tablet Take 30 mg by mouth 3 (three) times daily. 02/21/22   [provider]  lidocaine (ASPERCREME LIDOCAINE) 4 % Place 1 patch onto the skin daily. Wear for 12 hours then remove, wait 12 hours before applying a new patch to lower back    [provider]  metoprolol succinate (TOPROL-XL) 50 MG 24 hr tablet Take 1 tablet (50 mg total) by mouth daily. Take with or immediately following a meal. 03/15/22   [name], [name], MD  Multiple Vitamins-Minerals [facility] COMPLETE) TABS Take 1 tablet by mouth daily.    [provider]  nitroGLYCERIN (NITROSTAT) 0.4 MG SL tablet Place 1 tablet (0.4 mg total) under the tongue every 5 (five) minutes as needed for chest pain. 09/14/21   Loel Dubonnet, NP  Nutritional Supplements (FEEDING SUPPLEMENT, NEPRO CARB STEADY,) LIQD Take 237 mLs by mouth 2 (two) times daily with a meal.    [provider]  pantoprazole (PROTONIX) 40 MG tablet TAKE 1 TABLET BY MOUTH TWICE DAILY 04/13/22   [name], [name], MD  REPATHA SURECLICK 786 MG/ML SOAJ Inject 140 mg as directed every 14 (fourteen) days. Patient taking differently: Inject 140 mg as directed every 14 (fourteen) days. Starting 02/08/22 09/14/21   Loel Dubonnet, NP  sertraline (ZOLOFT) 100 MG tablet 1 daily 02/11/22   [name], MD    Physical Exam: Vitals:   05/03/22 1600 05/03/22 1630 05/03/22 1700 05/03/22 1730  BP: (!) 161/91 (!) 163/71 (!) 145/59 (!) 155/60  Pulse: 86 86 84 84  Resp: 19 16 (!) 22 (!) 23  Temp:      TempSrc:      SpO2: 96% 96% 95% 94%  Weight:      Height:        Constitutional: Elderly man who appears short of breath and chronically ill, but in no acute distress. Eyes: PERRL, lids and conjunctivae normal ENMT: Mucous membranes are moist.  JVD present. Neck: normal, supple, no masses, no thyromegaly Respiratory: clear to auscultation bilaterally, no wheezing, no crackles. Normal respiratory effort. No accessory muscle use.  Cardiovascular: Regular rate and rhythm with intermittent PVCs.  +1 pitting bilateral lower extremity edema.  Right jugular venous hemodialysis catheter in place. Abdomen: no tenderness, no masses palpated.  Bowel sounds positive.  Musculoskeletal: no clubbing / cyanosis. No joint deformity upper and lower extremities. Good ROM, no contractures. Normal muscle tone.  Skin: Healing wound of the dorsal aspect of the right great toe without erythema. Neurologic: CN 2-12 grossly intact.  Able to move all extremities. Psychiatric: Normal judgment and insight. Alert and oriented x 3. Normal mood.   Data Reviewed:  EKG reveals sinus rhythm at 70 bpm with ventricular trigeminy.  Reviewed labs, imaging, and pertinent records as noted above in HPI.  Assessment and Plan: Acute respiratory failure with hypoxia secondary to fluid overload with ESRD on HD Patient has been receiving dialysis today after missing his regularly scheduled session yesterday and he developed nausea and shortness of breath.  O2 saturations were noted to be as low as 88% on room air with improvement on 3 L of nasal cannula oxygen to greater than 92%.  BNP was greater than 4500.  CT angiogram of the chest obtained due to elevated D-dimer 2.264 which did not note any overt signs of a pulmonary embolism.   It did note cardiomegaly with right-sided pleural effusion, and findings consistent with early/developing pulmonary dialysis. -Admit to a telemetry bed -Continuous pulse oximetry  with nasal cannula oxygen maintain O2 saturation greater than 92% -Strict I&Os -Renal and carb modified diet with fluid restriction -Lasix 80 mg IV x1 dose  -Dr. [name] nephrology consulted, and would plan on dialyzing patient in a.m.  Combined systolic and diastolic CHF Acute on chronic.  Patient with at least 1+ pitting bilateral lower extremity edema and JVD present.  Last echocardiogram noted EF to be 40 -45% with global hypokinesis of the left ventricle. -Fluid management with hemodialysis  Hypokalemia Acute.  Potassium 3.2 on admission. -Give potassium chloride 10 mEq p.o. -Check renal function panel daily  Essential hypertension Blood pressure currently 120/56 -166/83. -Resume home medication regimen as tolerated  Dysphagia -Aspiration precautions with dysphagia 3 diet  Diabetes mellitus type 2 Last hemoglobin A1c was 7.7. -Hypoglycemic protocols -CBGs before every meal with very sensitive SSI -Adjust insulin regimen as needed  Esophageal adenocarcinoma Follows with [facility], Dr. [name]. Status post EMR, cryotherapy, and most recently RFA in April 2023. 02/10/22 PET scan showed no local recurrence.Considered poor candidate for esophagectomy. After multidisciplinary discussion the plan was to continue with endoscopic treatments, and continue to follow-up with oncology for surveillance and any signs of progression of disease -Continue outpatient follow-up  CAD Status post CABG in 2020.  Patient had recent cardiac cath 02/01/2022 which showed patent grafts, severe three-vessel native CAD.   -Continue aspirin, Coreg, and Imdur  Anemia chronic disease Hemoglobin 9.9 g/dL which appears improved from prior. -CBC tomorrow morning  OSA -Continue BiPAP nightly at least for tonight, but patient not using  currently at home.  DNR Confirmed by the patient with his wife present at bedside  Advance Care Planning:   Code Status: DNR   Consults: Nephrology  Family Communication: Wife at stated at bedside  Severity of Illness: The appropriate patient status for this patient is INPATIENT. Inpatient status is judged to be reasonable and necessary in order to provide the required intensity of service to ensure the patient's safety. The patient's presenting symptoms, physical exam findings, and initial radiographic and laboratory data in the context of their chronic comorbidities is felt to place them at high risk for further clinical deterioration. Furthermore, it is not anticipated that the patient will be medically stable for discharge from the hospital within 2 midnights of admission.   * I certify that at the point of admission it is my clinical judgment that the patient will require inpatient hospital care spanning beyond 2 midnights from the point of admission due to high intensity of service, high risk for further deterioration and high frequency of surveillance required.*  Author: Norval Morton, MD 05/03/2022 5:54 PM  For on call review [email or web address].

## 2022-05-03 NOTE — ED Provider Notes (Signed)
[facility] EMERGENCY DEPARTMENT Provider Note   CSN: [id number] Arrival date & time: 05/03/22  1322     History  No chief complaint on file.   [name] is a 66 y.o. male.  HPI     [name] is a 66 y.o. male with past medical history of coronary artery disease, hypertension, type 2 diabetes, CHF, anemia, and end-stage renal disease on dialysis Monday Wednesday Friday.  He presents to the Emergency Department via EMS from dialysis center for evaluation of hypoxia and hypertension.  Patient states he missed his routine dialysis treatment yesterday went to dialysis this morning states he was not feeling well upon waking.  Ate breakfast and had 1 episode of vomiting shortly after.  He went to his dialysis appointment and completed 3 hours of his treatment when he suddenly became nauseous and short of breath and noted to have elevated blood pressure.  On EMS arrival, patient was noted to have O2 sat of 88% on room air and was placed on 3 L of oxygen by nasal cannula.  He was satting 92% on 3 L upon arrival here.  He denies any chest pain, headache or dizziness.  No continued vomiting.  No abdominal pain, diarrhea.  Patient does not use oxygen at baseline   Home Medications Prior to Admission medications   Medication Sig Start Date End Date Taking? Authorizing Provider  acetaminophen (TYLENOL) 325 MG tablet Take 2 tablets (650 mg total) by mouth every 6 (six) hours as needed for mild pain, moderate pain or fever. 08/02/21   Allie Bossier, MD  albuterol (VENTOLIN HFA) 108 (90 Base) MCG/ACT inhaler Inhale 2 puffs into the lungs every 6 (six) hours as needed. Patient taking differently: Inhale 2 puffs into the lungs every 6 (six) hours as needed for wheezing or shortness of breath. 06/24/21   Coral Spikes, DO  amiodarone (PACERONE) 400 MG tablet Take 1 tablet (400 mg total) by mouth 2 (two) times daily. Through 03/12/22.  Then 200 mg BID x 7 days starting 03/13/22.  Then 200 mg  once daily starting 03/20/22 03/08/22   Orson Eva, MD  ARIPiprazole (ABILIFY) 5 MG tablet Take 5 mg by mouth daily.    [provider]  ascorbic acid (VITAMIN C) 500 MG tablet Take 1 tablet (500 mg total) by mouth daily. 08/03/21   Allie Bossier, MD  aspirin EC 325 MG tablet Take 325 mg by mouth daily. 02/21/22   [provider]  calcium acetate (PHOSLO) 667 MG capsule Take 667 mg by mouth in the morning, at noon, and at bedtime.    [provider]  calcium-vitamin D (OSCAL WITH D) 500-5 MG-MCG tablet Take 1 tablet by mouth 2 (two) times daily.    [provider]  cefdinir (OMNICEF) 300 MG capsule Take 1 today then 1 take 3 times a week on dialysis days +1 on Sundays do this for 2 weeks 04/12/22   [name], MD  doxazosin (CARDURA) 2 MG tablet Take 1 tablet (2 mg total) by mouth daily. 03/15/22   [name], MD  EPINEPHrine 0.3 mg/0.3 mL IJ SOAJ injection Inject 0.3 mg into the muscle as needed for anaphylaxis. 01/25/21   [name], MD  fluconazole (DIFLUCAN) 100 MG tablet Take 1 tablet (100 mg total) by mouth daily. Patient not taking: Reported on 04/20/2022 03/09/22   Orson Eva, MD  hydrALAZINE (APRESOLINE) 50 MG tablet TAKE 1 TABLET BY MOUTH EVERY 8 HOURS  04/13/22   [name], MD  Insulin Pen Needle (B-D ULTRAFINE III SHORT PEN) 31G X 8 MM MISC 1 each by Does not apply route as directed. Patient not taking: Reported on 04/20/2022 05/30/19   Cassandria Anger, MD  isosorbide dinitrate (ISORDIL) 30 MG tablet Take 30 mg by mouth 3 (three) times daily. 02/21/22   [provider]  lidocaine (ASPERCREME LIDOCAINE) 4 % Place 1 patch onto the skin daily. Wear for 12 hours then remove, wait 12 hours before applying a new patch to lower back    [provider]  metoprolol succinate (TOPROL-XL) 50 MG 24 hr tablet Take 1 tablet (50 mg total) by mouth daily. Take with or immediately following a meal. 03/15/22   [name], [name], MD   Multiple Vitamins-Minerals [facility] COMPLETE) TABS Take 1 tablet by mouth daily.    [provider]  nitroGLYCERIN (NITROSTAT) 0.4 MG SL tablet Place 1 tablet (0.4 mg total) under the tongue every 5 (five) minutes as needed for chest pain. 09/14/21   Loel Dubonnet, NP  Nutritional Supplements (FEEDING SUPPLEMENT, NEPRO CARB STEADY,) LIQD Take 237 mLs by mouth 2 (two) times daily with a meal.    [provider]  pantoprazole (PROTONIX) 40 MG tablet TAKE 1 TABLET BY MOUTH TWICE DAILY 04/13/22   [name], [name], MD  REPATHA SURECLICK 701 MG/ML SOAJ Inject 140 mg as directed every 14 (fourteen) days. Patient taking differently: Inject 140 mg as directed every 14 (fourteen) days. Starting 02/08/22 09/14/21   Loel Dubonnet, NP  sertraline (ZOLOFT) 100 MG tablet 1 daily 02/11/22   [name], MD      Allergies    Bee venom, Atorvastatin, Diltiazem, Reglan [metoclopramide], Rosuvastatin, and Valsartan    Review of Systems   Review of Systems  Constitutional:  Negative for appetite change, chills and fever.  Respiratory:  Positive for shortness of breath. Negative for cough.   Cardiovascular:  Negative for chest pain and leg swelling.  Gastrointestinal:  Negative for abdominal pain, diarrhea, nausea and vomiting.  Musculoskeletal:  Negative for arthralgias.  Skin:  Negative for color change and rash.  Neurological:  Negative for dizziness and numbness.  Psychiatric/Behavioral:  Negative for confusion.     Physical Exam Updated Vital Signs BP (!) 146/62 (BP Location: Right Arm)   Pulse 93   Temp 99.2 F (37.3 C) (Oral)   Resp (!) 25   Ht _0  (1.778 m)   Wt 86.2 kg   SpO2 92%   BMI 27.26 kg/m  Physical Exam Vitals and nursing note reviewed.  Constitutional:      Appearance: He is ill-appearing.  HENT:     Mouth/Throat:     Mouth: Mucous membranes are moist.  Eyes:     Extraocular Movements: Extraocular movements intact.     Conjunctiva/sclera:  Conjunctivae normal.     Pupils: Pupils are equal, round, and reactive to light.  Cardiovascular:     Rate and Rhythm: Normal rate and regular rhythm.     Pulses: Normal pulses.  Pulmonary:     Breath sounds: No wheezing or rales.     Comments: Increased work of breathing on exam, lung sounds somewhat diminished bilaterally, no wheezes or rales. Abdominal:     General: There is no distension.     Palpations: Abdomen is soft.     Tenderness: There is no abdominal tenderness.  Musculoskeletal:     Cervical back: Normal range of motion.  Right lower leg: No edema.     Left lower leg: No edema.  Skin:    General: Skin is warm.     Capillary Refill: Capillary refill takes less than 2 seconds.  Neurological:     General: No focal deficit present.     Mental Status: He is alert.     Sensory: No sensory deficit.     Motor: Motor function is intact. No weakness.     Comments: CN II through XII intact.  Speech clear.  No pronator drift.  No facial droop     ED Results / Procedures / Treatments   Labs (all labs ordered are listed, but only abnormal results are displayed) Labs Reviewed  BRAIN NATRIURETIC PEPTIDE - Abnormal; Notable for the following components:      Result Value   B Natriuretic Peptide >4,500.0 (*)    All other components within normal limits  CBC WITH DIFFERENTIAL/PLATELET - Abnormal; Notable for the following components:   RBC 3.67 (*)    Hemoglobin 9.9 (*)    HCT 31.8 (*)    RDW 18.9 (*)    Neutro Abs 9.5 (*)    Lymphs Abs 0.2 (*)    All other components within normal limits  D-DIMER, QUANTITATIVE - Abnormal; Notable for the following components:   D-Dimer, Quant 2.26 (*)    All other components within normal limits  COMPREHENSIVE METABOLIC PANEL - Abnormal; Notable for the following components:   Potassium 3.2 (*)    Glucose, Bld 116 (*)    Creatinine, Ser 2.88 (*)    Calcium 8.7 (*)    Alkaline Phosphatase 131 (*)    Total Bilirubin 1.4 (*)    GFR,  Estimated 23 (*)    All other components within normal limits  CULTURE, BLOOD (ROUTINE X 2)  CULTURE, BLOOD (ROUTINE X 2)  RESP PANEL BY RT-PCR (FLU A&B, COVID) ARPGX2  LACTIC ACID, PLASMA  LACTIC ACID, PLASMA  URINALYSIS, ROUTINE W REFLEX MICROSCOPIC    EKG EKG Interpretation  Date/Time:  Tuesday May 03 2022 16:10:13 EST Ventricular Rate:  85 PR Interval:  164 QRS Duration: 124 QT Interval:  444 QTC Calculation: 528 R Axis:   104 Text Interpretation: Sinus rhythm Ventricular trigeminy Confirmed by Godfrey Pick ([id number]) on 05/03/2022 4:15:05 PM  Radiology CT Angio Chest PE W and/or Wo Contrast  Result Date: 05/03/2022 CLINICAL DATA:  Pulmonary embolism (PE) suspected, low to intermediate prob, positive D-dimer Pt arrived via [facility] c/o 3 hrs and 5 mins of dialysis treatment when he had a sudden onset of Nausea and hypertension. EXAM: CT ANGIOGRAPHY CHEST WITH CONTRAST TECHNIQUE: Multidetector CT imaging of the chest was performed using the standard protocol during bolus administration of intravenous contrast. Multiplanar CT image reconstructions and MIPs were obtained to evaluate the vascular anatomy. RADIATION DOSE REDUCTION: This exam was performed according to the departmental dose-optimization program which includes automated exposure control, adjustment of the mA and/or kV according to patient size and/or use of iterative reconstruction technique. CONTRAST:  94m OMNIPAQUE IOHEXOL 350 MG/ML SOLN COMPARISON:  Chest XR, concurrent.  CTA chest, 02/28/2022. FINDINGS: Suboptimal evaluation, secondary to motion degradation. Cardiovascular: Satisfactory opacification of the pulmonary arteries to the segmental level. No segmental or larger pulmonary embolus. Dilated main PA, measuring up to 4.2 cm. Multi chamber cardiac enlargement. No pericardial effusion. 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 RIGHT jugular approach dialysis catheter,  well-positioned with tip within the proximal RIGHT atrium. Mediastinum/Nodes: No enlarged mediastinal,  hilar, or axillary lymph nodes. Thyroid gland, trachea, and esophagus demonstrate no significant findings. Lungs/Pleura: Small volume RIGHT pleural effusion with adjacent dependent atelectasis. Mild bilateral pulmonary interstitial thickening with trace ground-glass opacities. No suspicious pulmonary nodule or mass. No pneumothorax. Upper Abdomen: No acute abnormality. Musculoskeletal: Postsurgical changes of median sternotomy. No acute chest wall abnormality. No significant osseous findings. Review of the MIP images confirms the above findings. IMPRESSION: Suboptimal evaluation, within these constraints; 1.  No segmental or larger pulmonary embolus. 2. Main pulmonary artery dilation, likely reflecting underlying pulmonary arterial hypertension. 3. Cardiomegaly, small volume RIGHT pleural effusion and interstitial thickening. Findings suspicious for early/developing pulmonary edema. Additional incidental, chronic and senescent findings as above. Electronically Signed   By: Michaelle Birks M.D.   On: 05/03/2022 17:06   DG Chest Port 1 View  Result Date: 05/03/2022 CLINICAL DATA:  Shortness of breath, onset of nausea and hypertension during dialysis treatment EXAM: PORTABLE CHEST 1 VIEW COMPARISON:  Portable exam 1420 hours compared to 03/03/2022 FINDINGS: RIGHT jugular central venous catheter with tip projecting over RIGHT atrium. Enlargement of cardiac silhouette with slight vascular congestion post CABG. RIGHT pleural effusion and mild RIGHT basilar atelectasis seen. Accentuation of interstitial markings similar to prior exam. Linear atelectasis RIGHT upper lobe unchanged. No pneumothorax or acute osseous findings. IMPRESSION: Enlargement of cardiac silhouette with vascular congestion. Small RIGHT pleural effusion and basilar atelectasis with persistent accentuation of interstitial markings and linear  atelectasis in RIGHT upper lobe. Electronically Signed   By: Lavonia Dana M.D.   On: 05/03/2022 14:34    Procedures Procedures    Medications Ordered in ED Medications - No data to display  ED Course/ Medical Decision Making/ A&P                           Medical Decision Making Patient sent here from dialysis center for evaluation of hypoxia, hypertension and nausea.  Symptoms began suddenly while receiving his dialysis treatment.  States he did not feel well this morning prior to going to dialysis had 1 episode of vomiting after eating breakfast.  Denies any chest pain.  He is not on supplemental oxygen at baseline.  On my exam, patient on 3 L oxygen by nasal cannula O2 sats in the lower to mid 90s.  He has some continued increase work of breathing.  Lung sounds slightly diminished on the right.  No peripheral edema.  Patient's source of symptoms unclear at this time.  Possible evolving sepsis.  On review of patient's medical record, he was noted to require hospitalization and September for pleural effusion and underwent thoracentesis.  He is sudden onset symptoms today may be recurrence of his pleural effusion, infection also considered.  Patient does still make urine.  His blood pressure here has been okay, initially tachypneic but that has improved.  There is been no tachycardia  Amount and/or Complexity of Data Reviewed Labs: ordered.    Details: Labs interpreted by me, no evidence of leukocytosis, hemoglobin 9.9 this is improved by one-point from 1 month ago, chemistries show potassium of 3.2.  Serum creatinine 2.88, BUN reassuring.  Mildly elevated transaminases with alk phos of 131, total bilirubin 1.4 AST and ALT are unremarkable.  Lactic acid also reassuring.  D-dimer elevated at 2.26 and BNP is elevated at greater than 4500 this appears to be baseline as his BNP was same 2 months ago and similar for last 9 months.   Radiology: ordered.  Details: Chest x-ray shows enlargement of  cardiac silhouette with vascular congestion, small right-sided pleural effusion  Proceeded with CT angio of the chest for further evaluation of elevated D-dimer after discussion and approval from nephrology.  CT angio without evidence of PE.  Does show likely small right pleural effusion and suspicious for early or developing pulmonary edema ECG/medicine tests: ordered.    Details: EKG shows sinus rhythm with ventricular trigeminy Discussion of management or test interpretation with external provider(s): Patient currently on dialysis, continues to make some urine has been on dialysis less than 6 months.  With elevated D-dimer I feel that patient needs further evaluation for possible PE versus recurrent pleural effusion versus infection.  Would like to proceed with CT angio of the chest, considered VQ scan but availability is limited and would delay patient's treatment plan, so will call nephrology for recommendations for proceeding with CT angio of the chest.  Consulted nephrology, Dr. Moshe Cipro discussed findings, she agreeable to proceed with CT imaging.  On patient recheck, O2 sats remained in the mid 90s on 3 L O2, when patient's oxygen was discontinued, O2 sats dropped to upper 80s he was placed back on 3 L. CT suggests pulmonary edema, continues to have small right pleural effusion.  I feel that given his hypoxic state that he would benefit from hospital admission.  May need further dialysis.  Discussed findings with [facility] hospitalist, Dr. Tamala Julian who agrees to admit.     Risk Prescription drug management.           Final Clinical Impression(s) / ED Diagnoses Final diagnoses:  Acute respiratory failure with hypoxia [facility])    Rx / DC Orders ED Discharge Orders     None         Kem Parkinson, PA-C 05/03/22 1815    Godfrey Pick, MD 05/04/22 1026

## 2022-05-03 NOTE — Progress Notes (Signed)
Pt refused BIPAP for tonight but states may try tomorrow night.

## 2022-05-03 NOTE — ED Triage Notes (Signed)
Pt arrived via [facility] c/o 3 hrs and 5 mins of dialysis treatment when he had a sudden onset of Nausea and hypertension. Denies dizziness. SpO2 88 on RA, 3L [name] and SpO2 went up to high 90. Cbg 163.

## 2022-05-04 ENCOUNTER — Ambulatory Visit: Payer: Self-pay | Admitting: *Deleted

## 2022-05-04 DIAGNOSIS — J9601 Acute respiratory failure with hypoxia: Secondary | ICD-10-CM | POA: Diagnosis not present

## 2022-05-04 DIAGNOSIS — R7881 Bacteremia: Secondary | ICD-10-CM

## 2022-05-04 DIAGNOSIS — Z66 Do not resuscitate: Secondary | ICD-10-CM | POA: Diagnosis not present

## 2022-05-04 DIAGNOSIS — I5043 Acute on chronic combined systolic (congestive) and diastolic (congestive) heart failure: Secondary | ICD-10-CM | POA: Diagnosis not present

## 2022-05-04 LAB — BLOOD CULTURE ID PANEL (REFLEXED) - BCID2

## 2022-05-04 LAB — CBC
HCT: 32.5 % — ABNORMAL LOW (ref 39.0–52.0)
Hemoglobin: 9.9 g/dL — ABNORMAL LOW (ref 13.0–17.0)
MCH: 27 pg (ref 26.0–34.0)
MCHC: 30.5 g/dL (ref 30.0–36.0)
MCV: 88.8 fL (ref 80.0–100.0)
Platelets: 205 10*3/uL (ref 150–400)
RBC: 3.66 MIL/uL — ABNORMAL LOW (ref 4.22–5.81)
RDW: 19.4 % — ABNORMAL HIGH (ref 11.5–15.5)
WBC: 14.3 10*3/uL — ABNORMAL HIGH (ref 4.0–10.5)
nRBC: 0 % (ref 0.0–0.2)

## 2022-05-04 LAB — RENAL FUNCTION PANEL
Albumin: 3.1 g/dL — ABNORMAL LOW (ref 3.5–5.0)
Anion gap: 11 (ref 5–15)
BUN: 27 mg/dL — ABNORMAL HIGH (ref 8–23)
CO2: 27 mmol/L (ref 22–32)
Calcium: 8.3 mg/dL — ABNORMAL LOW (ref 8.9–10.3)
Chloride: 100 mmol/L (ref 98–111)
Creatinine, Ser: 3.94 mg/dL — ABNORMAL HIGH (ref 0.61–1.24)
GFR, Estimated: 16 mL/min — ABNORMAL LOW (ref >60)
Glucose, Bld: 158 mg/dL — ABNORMAL HIGH (ref 70–99)
Phosphorus: 4.6 mg/dL (ref 2.5–4.6)
Potassium: 3.8 mmol/L (ref 3.5–5.1)
Sodium: 138 mmol/L (ref 135–145)

## 2022-05-04 LAB — HEPATITIS B SURFACE ANTIGEN: Hepatitis B Surface Ag: NONREACTIVE

## 2022-05-04 LAB — GLUCOSE, CAPILLARY
Glucose-Capillary: 249 mg/dL — ABNORMAL HIGH (ref 70–99)
Glucose-Capillary: 346 mg/dL — ABNORMAL HIGH (ref 70–99)

## 2022-05-04 MED ORDER — SODIUM CHLORIDE 0.9 % IV SOLN
2.0000 g | INTRAVENOUS | Status: DC
  Administered 2022-05-04: 2 g via INTRAVENOUS

## 2022-05-04 MED ORDER — HEPARIN SODIUM (PORCINE) 1000 UNIT/ML DIALYSIS
1000.0000 [IU] | INTRAMUSCULAR | Status: DC | PRN
  Administered 2022-05-04: 3800 [IU]

## 2022-05-04 MED ORDER — CALCITRIOL 0.25 MCG PO CAPS
0.2500 ug | ORAL_CAPSULE | Freq: Once | ORAL | Status: AC
  Administered 2022-05-04: 0.25 ug via ORAL
  Filled 2022-05-04: qty 1

## 2022-05-04 MED ORDER — CHLORHEXIDINE GLUCONATE CLOTH 2 % EX PADS
6.0000 | MEDICATED_PAD | Freq: Every day | CUTANEOUS | Status: DC
  Administered 2022-05-04 – 2022-05-05 (×2): 6 via TOPICAL

## 2022-05-04 MED ORDER — CALCIUM ACETATE (PHOS BINDER) 667 MG PO CAPS
667.0000 mg | ORAL_CAPSULE | Freq: Three times a day (TID) | ORAL | Status: DC
  Administered 2022-05-04 – 2022-05-05 (×4): 667 mg via ORAL
  Filled 2022-05-04 (×4): qty 1

## 2022-05-04 MED ORDER — HEPARIN SODIUM (PORCINE) 1000 UNIT/ML IJ SOLN
INTRAMUSCULAR | Status: AC
  Administered 2022-05-04: 1700 [IU] via INTRAVENOUS_CENTRAL
  Filled 2022-05-04: qty 6

## 2022-05-04 MED ORDER — HEPARIN SODIUM (PORCINE) 1000 UNIT/ML DIALYSIS: 20.0000 [IU]/kg | INTRAMUSCULAR | Status: DC | PRN

## 2022-05-04 MED ORDER — ALTEPLASE 2 MG IJ SOLR: 2.0000 mg | Freq: Once | INTRAMUSCULAR | Status: DC | PRN

## 2022-05-04 MED ORDER — SODIUM CHLORIDE 0.9 % IV SOLN
2.0000 g | INTRAVENOUS | Status: DC
  Administered 2022-05-04: 2 g via INTRAVENOUS
  Filled 2022-05-04: qty 20

## 2022-05-04 NOTE — Procedures (Signed)
   HEMODIALYSIS TREATMENT NOTE:   3.5 hour sequential UF only session completed using RIJ tDC.  Cath entry site is unremarkable. Goal met: 5 liters removed without interruption in UF.  All blood was returned.  Hemodynamically stable throughout treatment.  Cefepime given during last 30 minutes.  Unable to wean O2.  Pre-HD, was saturating 87% on room air.  Post HD, 91% on 1L.  Hand-off given to Sharrell Ku, LPN.   Rockwell Alexandria, RN

## 2022-05-04 NOTE — Progress Notes (Signed)
PHARMACY - PHYSICIAN COMMUNICATION CRITICAL VALUE ALERT - BLOOD CULTURE IDENTIFICATION (BCID)  [name] is an 66 y.o. male who presented to [facility] on 05/03/2022 with a chief complaint of SOB and hypoxia  Assessment:  ESRD on HD MWF. Was on holiday schedule and dialyzed Tuesday this week but developed SOB. Plan to still dialyze today . BCX is now positive for Enterobacter Cloacae complex.   Name of physician (or Provider) Contacted: Dr. Wynetta Emery  Current antibiotics: Ceftriaxone 2gm IV q24  Changes to prescribed antibiotics recommended:  D/C ceftriaxone Cefepime 2gm IV after qHD F/u cxs and clinical progress  Results for orders placed or performed during the hospital encounter of 05/03/22  Blood Culture ID Panel (Reflexed) (Collected: 05/03/2022  2:37 PM)  Result Value Ref Range   Enterococcus faecalis NOT DETECTED NOT DETECTED   Enterococcus Faecium NOT DETECTED NOT DETECTED   Listeria monocytogenes NOT DETECTED NOT DETECTED   Staphylococcus species NOT DETECTED NOT DETECTED   Staphylococcus aureus (BCID) NOT DETECTED NOT DETECTED   Staphylococcus epidermidis NOT DETECTED NOT DETECTED   Staphylococcus lugdunensis NOT DETECTED NOT DETECTED   Streptococcus species NOT DETECTED NOT DETECTED   Streptococcus agalactiae NOT DETECTED NOT DETECTED   Streptococcus pneumoniae NOT DETECTED NOT DETECTED   Streptococcus pyogenes NOT DETECTED NOT DETECTED   A.calcoaceticus-baumannii NOT DETECTED NOT DETECTED   Bacteroides fragilis NOT DETECTED NOT DETECTED   Enterobacterales DETECTED (A) NOT DETECTED   Enterobacter cloacae complex DETECTED (A) NOT DETECTED   Escherichia coli NOT DETECTED NOT DETECTED   Klebsiella aerogenes NOT DETECTED NOT DETECTED   Klebsiella oxytoca NOT DETECTED NOT DETECTED   Klebsiella pneumoniae NOT DETECTED NOT DETECTED   Proteus species NOT DETECTED NOT DETECTED   Salmonella species NOT DETECTED NOT DETECTED   Serratia marcescens NOT DETECTED NOT  DETECTED   Haemophilus influenzae NOT DETECTED NOT DETECTED   Neisseria meningitidis NOT DETECTED NOT DETECTED   Pseudomonas aeruginosa NOT DETECTED NOT DETECTED   Stenotrophomonas maltophilia NOT DETECTED NOT DETECTED   Candida albicans NOT DETECTED NOT DETECTED   Candida auris NOT DETECTED NOT DETECTED   Candida glabrata NOT DETECTED NOT DETECTED   Candida krusei NOT DETECTED NOT DETECTED   Candida parapsilosis NOT DETECTED NOT DETECTED   Candida tropicalis NOT DETECTED NOT DETECTED   Cryptococcus neoformans/gattii NOT DETECTED NOT DETECTED   CTX-M ESBL NOT DETECTED NOT DETECTED   Carbapenem resistance IMP NOT DETECTED NOT DETECTED   Carbapenem resistance KPC NOT DETECTED NOT DETECTED   Carbapenem resistance NDM NOT DETECTED NOT DETECTED   Carbapenem resist OXA 48 LIKE NOT DETECTED NOT DETECTED   Carbapenem resistance VIM NOT DETECTED NOT DETECTED    [name] 05/04/2022  1:53 PM
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
## 2022-05-04 NOTE — Progress Notes (Signed)
Patient slept throughout this shift, he did wake to speak to me a few times during his shift assessment and med administration. Denies pain or discomfort at this time.

## 2022-05-04 NOTE — Patient Outreach (Signed)
  Care Coordination   05/04/2022  Name: SAMIE REASONS MRN: [id number] DOB: 1955/12/25   Care Coordination Outreach Attempts:  An unsuccessful telephone outreach was attempted today to offer the patient information about available care coordination services as a benefit of their health plan. HIPAA compliant message left on voicemail, providing contact information for CSW, encouraging patient to return CSW's call at his earliest convenience.  CSW noted that patient is currently hospitalized, as of 05/03/2022.  Follow Up Plan:  Additional outreach attempts will be made to offer the patient care coordination information and services.   Encounter Outcome:  No Answer.   Care Coordination Interventions Activated:  No.    Care Coordination Interventions:  No, not indicated.    [name], BSW, MSW, LCSW  Licensed Education officer, [facility]  Mailing [place] [address] [place], [place] [address] Physical Address-[address] [place], [place] [address] Toll Free Main # [phone] Fax # [phone] Cell # [phone] [email or web address]

## 2022-05-04 NOTE — Care Management Important Message (Signed)
Important Message  Patient Details  Name: [name] MRN: [id number] Date of Birth: 1956/04/22   Medicare Important Message Given:  N/A - LOS <3 / Initial given by admissions     Tommy Medal 05/04/2022, 10:42 AM
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
## 2022-05-04 NOTE — Progress Notes (Signed)
PROGRESS NOTE   [name]  ATF:[id number] DOB: 09/14/1955 DOA: 05/03/2022 PCP: [name], MD   No chief complaint on file.  Level of care: Telemetry  Brief Admission History:   66 y.o. male with medical history significant of hypertension, CAD s/p CABG, combined systolic and diastolic CHF, SVT, CVA, esophageal adenocarcinoma, diabetes mellitus type II, presents with acute onset of nausea and shortness of breath.  Due to the holiday weekend and his hemodialysis schedule had gotten changed from Monday/Wednesday/Friday and he had been scheduled for hemodialysis this morning.  He had eaten some of his breakfast this morning and vomited.  Emesis was just of food contents and nonbloody in appearance.  Thereafter patient reported feeling okay and his wife took him to hemodialysis.  He was on the machine for approximately 3 hours and developed nausea again.  He was noted to be hypertensive at the time and O2 saturations dropped down to as low as 88% on room air.  Normally patient is not on oxygen at baseline.  He denied having any recent fever, chest pain, headache, dizziness, abdominal pain, diarrhea, and he did not vomit again.  He has been on antibiotics for treatment of a right toe wound that he sustained.  Denies having any redness or erythema from the wound.  He is supposed to be set up to see podiatry in the outpatient setting.     Last hospitalized 9/18-9/26 for acute respiratory failure hypoxia while receiving hemodialysis patient was noted to have a large right-sided pleural effusion for which patient underwent thoracentesis removing 1.3 L of fluid.   In the emergency department patient was noted to be tachypneic with blood pressures 120/56-166/83, and O2 saturations currently maintained on 3 L of nasal cannula oxygen.  Labs noted hemoglobin 9.9, potassium 3.2, BUN 21, creatinine 2.88, [place] phosphatase 131, total bilirubin 1.4, D-dimer 2.26, and BNP > 4500.  CT angiogram of the chest  was done which noted no overt pulmonary embolism, cardiomegaly, small right pleural effusion, and findings suspicious for early/developing pulmonary edema.   Assessment and Plan:  Enterobacter cloacae complex Bacteremia  - discussed with pharm D - changing antibiotic to Cefepime 2gm IV QHD - source likely TDC  Acute respiratory failure with hypoxia secondary to fluid overload with ESRD on HD  - HD planned for 11/22 for volume removal   Combined systolic and diastolic CHF Acute on chronic.  Patient with at least 1+ pitting bilateral lower extremity edema and JVD present.  Last echocardiogram noted EF to be 40 -45% with global hypokinesis of the left ventricle. -Fluid management with hemodialysis  Hypokalemia Acute.  Potassium 3.2 on admission. -Give potassium chloride 10 mEq p.o. -Check renal function panel daily   Essential hypertension Blood pressure currently 120/56 -166/83. -Resume home medication regimen as tolerated   Dysphagia -Aspiration precautions with dysphagia 3 diet   Diabetes mellitus type 2 Last hemoglobin A1c was 7.7. -Hypoglycemic protocols -CBGs before every meal with very sensitive SSI -Adjust insulin regimen as needed   Esophageal adenocarcinoma Follows with [facility], Dr. [name]. Status post EMR, cryotherapy, and most recently RFA in April 2023. 02/10/22 PET scan showed no local recurrence.Considered poor candidate for esophagectomy. After multidisciplinary discussion the plan was to continue with endoscopic treatments, and continue to follow-up with oncology for surveillance and any signs of progression of disease -Continue outpatient follow-up   CAD Status post CABG in 2020.  Patient had recent cardiac cath 02/01/2022 which showed patent grafts, severe three-vessel native CAD.   -  Continue aspirin, Coreg, and Imdur   Anemia chronic disease Hemoglobin 9.9 g/dL which appears improved from prior. -CBC will be monitored    OSA -Continue CPAP nightly    DNR Confirmed by the patient with his wife present at bedside   DVT prophylaxis: [place] heparin Code Status: DNR  Family Communication:  Disposition: Status is: Inpatient Remains inpatient appropriate because: IV antibiotics required    Consultants:  Nephrology  Procedures:  Hemodialysis 11/22 Antimicrobials:  Ceftriaxone 11/22 Cefepime 11/22>>  Subjective: Pt reports no specific complaint this morning.   Objective: Vitals:   05/03/22 2138 05/04/22 0011 05/04/22 0447 05/04/22 0500  BP: 138/63 (!) 141/67 128/67   Pulse: 76 77 77   Resp: '14 16 18   '$ Temp: 98.6 F (37 C) 98.5 F (36.9 C) (!) 97.2 F (36.2 C)   TempSrc:      SpO2: 96% 97% 99%   Weight:    87.1 kg  Height:        Intake/Output Summary (Last 24 hours) at 05/04/2022 1405 Last data filed at 05/04/2022 0500 Gross per 24 hour  Intake 240 ml  Output --  Net 240 ml   Filed Weights   05/03/22 1333 05/04/22 0500  Weight: 86.2 kg 87.1 kg   Examination:  General exam: blind right eye, appears chronically ill; Appears calm and comfortable  Respiratory system: crackles heard.  Cardiovascular system: normal S1 & S2 heard. No JVD, murmurs, rubs, gallops or clicks. No pedal edema. Gastrointestinal system: Abdomen is nondistended, soft and nontender. No organomegaly or masses felt. Normal bowel sounds heard. Central nervous system: Alert and oriented. No focal neurological deficits. Extremities: Symmetric 5 x 5 power. Skin: No rashes, lesions or ulcers. Psychiatry: Judgement and insight appear normal. Mood & affect appropriate.   Data Reviewed: I have personally reviewed following labs and imaging studies  CBC: Recent Labs  Lab 05/03/22 1410 05/04/22 0314  WBC 10.1 14.3*  NEUTROABS 9.5*  --   HGB 9.9* 9.9*  HCT 31.8* 32.5*  MCV 86.6 88.8  PLT 206 846    Basic Metabolic Panel: Recent Labs  Lab 05/03/22 1410 05/04/22 0314  NA 138 138  K 3.2* 3.8  CL 98 100  CO2 27 27  GLUCOSE 116* 158*  BUN 21  27*  CREATININE 2.88* 3.94*  CALCIUM 8.7* 8.3*  PHOS  --  4.6    CBG: No results for input(s): "GLUCAP" in the last 168 hours.  Recent Results (from the past 240 hour(s))  Culture, blood (routine x 2)     Status: None (Preliminary result)   Collection Time: 05/03/22  2:37 PM   Specimen: BLOOD  Result Value Ref Range Status   Specimen Description   Final    BLOOD BLOOD RIGHT HAND Performed at [facility], [address], [place], [place] [address]    Special Requests   Final    BOTTLES DRAWN AEROBIC AND ANAEROBIC Blood Culture adequate volume Performed at [facility], [address], [place], [place] [address]    Culture  Setup Time   Final    [place] M. Rossen RN at 1007 05/04/22  R.[place] AEROBIC BOTTLE ONLY Organism ID to follow Performed at [facility], [place] [address], [place], [place] [address]    Culture PENDING  Incomplete   Report Status PENDING  Incomplete  Blood Culture ID Panel (Reflexed)     Status: Abnormal   Collection Time: 05/03/22  2:37 PM  Result Value Ref Range Status  Enterococcus faecalis NOT DETECTED NOT DETECTED Final   Enterococcus Faecium NOT DETECTED NOT DETECTED Final   Listeria monocytogenes NOT DETECTED NOT DETECTED Final   Staphylococcus species NOT DETECTED NOT DETECTED Final   Staphylococcus aureus (BCID) NOT DETECTED NOT DETECTED Final   Staphylococcus epidermidis NOT DETECTED NOT DETECTED Final   Staphylococcus lugdunensis NOT DETECTED NOT DETECTED Final   Streptococcus species NOT DETECTED NOT DETECTED Final   Streptococcus agalactiae NOT DETECTED NOT DETECTED Final   Streptococcus pneumoniae NOT DETECTED NOT DETECTED Final   Streptococcus pyogenes NOT DETECTED NOT DETECTED Final   A.calcoaceticus-baumannii NOT DETECTED NOT DETECTED Final   Bacteroides fragilis NOT DETECTED NOT DETECTED Final   Enterobacterales DETECTED (A) NOT DETECTED Final    Comment: Enterobacterales represent a large order of gram negative  bacteria, not a single organism. CRITICAL RESULT CALLED TO, READ BACK BY AND VERIFIED WITH: PHARMD LORI P. [phone] FCP    Enterobacter cloacae complex DETECTED (A) NOT DETECTED Final    Comment: CRITICAL RESULT CALLED TO, READ BACK BY AND VERIFIED WITH: PHARMD LORI P. [id number] FCP    Escherichia coli NOT DETECTED NOT DETECTED Final   Klebsiella aerogenes NOT DETECTED NOT DETECTED Final   Klebsiella oxytoca NOT DETECTED NOT DETECTED Final   Klebsiella pneumoniae NOT DETECTED NOT DETECTED Final   Proteus species NOT DETECTED NOT DETECTED Final   Salmonella species NOT DETECTED NOT DETECTED Final   Serratia marcescens NOT DETECTED NOT DETECTED Final   Haemophilus influenzae NOT DETECTED NOT DETECTED Final   Neisseria meningitidis NOT DETECTED NOT DETECTED Final   Pseudomonas aeruginosa NOT DETECTED NOT DETECTED Final   Stenotrophomonas maltophilia NOT DETECTED NOT DETECTED Final   Candida albicans NOT DETECTED NOT DETECTED Final   Candida auris NOT DETECTED NOT DETECTED Final   Candida glabrata NOT DETECTED NOT DETECTED Final   Candida krusei NOT DETECTED NOT DETECTED Final   Candida parapsilosis NOT DETECTED NOT DETECTED Final   Candida tropicalis NOT DETECTED NOT DETECTED Final   Cryptococcus neoformans/gattii NOT DETECTED NOT DETECTED Final   CTX-M ESBL NOT DETECTED NOT DETECTED Final   Carbapenem resistance IMP NOT DETECTED NOT DETECTED Final   Carbapenem resistance KPC NOT DETECTED NOT DETECTED Final   Carbapenem resistance NDM NOT DETECTED NOT DETECTED Final   Carbapenem resist OXA 48 LIKE NOT DETECTED NOT DETECTED Final   Carbapenem resistance VIM NOT DETECTED NOT DETECTED Final    Comment: Performed at [facility], [address], [place], [place] [address]  Culture, blood (routine x 2)     Status: None (Preliminary result)   Collection Time: 05/03/22  2:42 PM   Specimen: BLOOD  Result Value Ref Range Status   Specimen Description BLOOD BLOOD LEFT HAND  Final    Special Requests   Final    Blood Culture results may not be optimal due to an excessive volume of blood received in culture bottles BOTTLES DRAWN AEROBIC AND ANAEROBIC   Culture  Setup Time   Final    [place] M. Rossen at 1328  05/04/22 R.BYRD Performed at [facility], [address], [place], [place] [address]    Culture PENDING  Incomplete   Report Status PENDING  Incomplete  Resp Panel by RT-PCR (Flu A&B, Covid) Anterior Nasal Swab     Status: None   Collection Time: 05/03/22  3:09 PM   Specimen: Anterior Nasal Swab  Result Value Ref Range Status   SARS Coronavirus 2 by RT PCR NEGATIVE NEGATIVE  Final    Comment: (NOTE) SARS-CoV-2 target nucleic acids are NOT DETECTED.  The SARS-CoV-2 RNA is generally detectable in upper respiratory specimens during the acute phase of infection. The lowest concentration of SARS-CoV-2 viral copies this assay can detect is 138 copies/mL. A negative result does not preclude SARS-Cov-2 infection and should not be used as the sole basis for treatment or other patient management decisions. A negative result may occur with  improper specimen collection/handling, submission of specimen other than nasopharyngeal swab, presence of viral mutation(s) within the areas targeted by this assay, and inadequate number of viral copies(<138 copies/mL). A negative result must be combined with clinical observations, patient history, and epidemiological information. The expected result is Negative.  Fact Sheet for Patients:  EntrepreneurPulse.com.au  Fact Sheet for Healthcare Providers:  IncredibleEmployment.be  This test is no t yet approved or cleared by the [place] FDA and  has been authorized for detection and/or diagnosis of SARS-CoV-2 by FDA under an Emergency Use Authorization (EUA). This EUA will remain  in effect (meaning this test can be used) for the duration of the COVID-19 declaration under  Section 564(b)(1) of the Act, 21 U.S.C.section 360bbb-3(b)(1), unless the authorization is terminated  or revoked sooner.       Influenza A by PCR NEGATIVE NEGATIVE Final   Influenza B by PCR NEGATIVE NEGATIVE Final    Comment: (NOTE) The Xpert Xpress SARS-CoV-2/FLU/RSV plus assay is intended as an aid in the diagnosis of influenza from Nasopharyngeal swab specimens and should not be used as a sole basis for treatment. Nasal washings and aspirates are unacceptable for Xpert Xpress SARS-CoV-2/FLU/RSV testing.  Fact Sheet for Patients: EntrepreneurPulse.com.au  Fact Sheet for Healthcare Providers: IncredibleEmployment.be  This test is not yet approved or cleared by the [place] FDA and has been authorized for detection and/or diagnosis of SARS-CoV-2 by FDA under an Emergency Use Authorization (EUA). This EUA will remain in effect (meaning this test can be used) for the duration of the COVID-19 declaration under Section 564(b)(1) of the Act, 21 U.S.C. section 360bbb-3(b)(1), unless the authorization is terminated or revoked.  Performed at [facility], [address], [place], [place] [address]   Culture, blood (single) w Reflex to ID Panel     Status: None (Preliminary result)   Collection Time: 05/03/22  8:02 PM   Specimen: BLOOD RIGHT HAND  Result Value Ref Range Status   Specimen Description BLOOD RIGHT HAND  Final   Special Requests   Final    BOTTLES DRAWN AEROBIC AND ANAEROBIC Blood Culture adequate volume Performed at [facility], [address] [place], [place] [address]    Culture PENDING  Incomplete   Report Status PENDING  Incomplete     Radiology Studies: CT Angio Chest PE W and/or Wo Contrast  Result Date: 05/03/2022 CLINICAL DATA:  Pulmonary embolism (PE) suspected, low to intermediate prob, positive D-dimer Pt arrived via [facility] c/o 3 hrs and 5 mins of dialysis treatment when he had a sudden onset of Nausea and  hypertension. EXAM: CT ANGIOGRAPHY CHEST WITH CONTRAST TECHNIQUE: Multidetector CT imaging of the chest was performed using the standard protocol during bolus administration of intravenous contrast. Multiplanar CT image reconstructions and MIPs were obtained to evaluate the vascular anatomy. RADIATION DOSE REDUCTION: This exam was performed according to the departmental dose-optimization program which includes automated exposure control, adjustment of the mA and/or kV according to patient size and/or use of iterative reconstruction technique. CONTRAST:  54m OMNIPAQUE IOHEXOL 350 MG/ML SOLN COMPARISON:  Chest XR, concurrent.  CTA chest, 02/28/2022. FINDINGS: Suboptimal evaluation, secondary to motion degradation. Cardiovascular: Satisfactory opacification of the pulmonary arteries to the segmental level. No segmental or larger pulmonary embolus. Dilated main PA, measuring up to 4.2 cm. Multi chamber cardiac enlargement. No pericardial effusion. 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 RIGHT jugular approach dialysis catheter, well-positioned with tip within the proximal RIGHT atrium. Mediastinum/Nodes: No enlarged mediastinal, hilar, or axillary lymph nodes. Thyroid gland, trachea, and esophagus demonstrate no significant findings. Lungs/Pleura: Small volume RIGHT pleural effusion with adjacent dependent atelectasis. Mild bilateral pulmonary interstitial thickening with trace ground-glass opacities. No suspicious pulmonary nodule or mass. No pneumothorax. Upper Abdomen: No acute abnormality. Musculoskeletal: Postsurgical changes of median sternotomy. No acute chest wall abnormality. No significant osseous findings. Review of the MIP images confirms the above findings. IMPRESSION: Suboptimal evaluation, within these constraints; 1.  No segmental or larger pulmonary embolus. 2. Main pulmonary artery dilation, likely reflecting underlying pulmonary arterial hypertension.  3. Cardiomegaly, small volume RIGHT pleural effusion and interstitial thickening. Findings suspicious for early/developing pulmonary edema. Additional incidental, chronic and senescent findings as above. Electronically Signed   By: Michaelle Birks M.D.   On: 05/03/2022 17:06   DG Chest Port 1 View  Result Date: 05/03/2022 CLINICAL DATA:  Shortness of breath, onset of nausea and hypertension during dialysis treatment EXAM: PORTABLE CHEST 1 VIEW COMPARISON:  Portable exam 1420 hours compared to 03/03/2022 FINDINGS: RIGHT jugular central venous catheter with tip projecting over RIGHT atrium. Enlargement of cardiac silhouette with slight vascular congestion post CABG. RIGHT pleural effusion and mild RIGHT basilar atelectasis seen. Accentuation of interstitial markings similar to prior exam. Linear atelectasis RIGHT upper lobe unchanged. No pneumothorax or acute osseous findings. IMPRESSION: Enlargement of cardiac silhouette with vascular congestion. Small RIGHT pleural effusion and basilar atelectasis with persistent accentuation of interstitial markings and linear atelectasis in RIGHT upper lobe. Electronically Signed   By: Lavonia Dana M.D.   On: 05/03/2022 14:34    Scheduled Meds:  calcium acetate  667 mg Oral TID WC   Chlorhexidine Gluconate Cloth  6 each Topical Daily   Chlorhexidine Gluconate Cloth  6 each Topical Q0600   heparin  5,000 Units Subcutaneous Q8H   insulin aspart  0-6 Units Subcutaneous TID WC   sodium chloride flush  3 mL Intravenous Q12H   Continuous Infusions:  ceFEPime (MAXIPIME) IV       LOS: 1 day   Time spent: 38 mins  Novali Vollman Wynetta Emery, MD How to contact the [facility] Attending or Consulting provider [place] or covering provider during after hours [place], for this patient?  Check the care team in [facility] and look for a) attending/consulting [facility] provider listed and b) the [facility] team listed Log into [email or web address] and use [place]'s universal password to access. If you do not have the  password, please contact the hospital operator. Locate the [facility] provider you are looking for under [facility] and page to a number that you can be directly reached. If you still have difficulty reaching the provider, please page the [facility] (Director on Call) for the Hospitalists listed on amion for assistance.  05/04/2022, 2:05 PM

## 2022-05-04 NOTE — Progress Notes (Signed)
Pt refused BIPAP again tonight and will call if he changes his mind

## 2022-05-04 NOTE — Consult Note (Signed)
Reason for Consult: To manage dialysis and dialysis related needs Referring Physician: Talon Regala is an 66 y.o. male with T2DM, HTN, CAD s/p CABG, combined systolic and diastolic heart failure, esophageal CA-  not candidate for esophagectomy  as well as ESRD-  started on HD this past summer.  He had a hosp from 9/18 to 9/26 with SOB/hypoxia- had a large right sided pleural effusion tapped 1.3 liters with resolution of sxms.  He presented to HD yesterday ( holiday schedule)  because 3 hours into HD he developed nausea and worsening SOB-  CXR showing pulm edema and small right pleural effusion, CT done- neg for PE.  He continued to be hypoxic so was admitted   Dialyzes at [place] MWF 4 hours  EDW 83- does not miss-  has not been getting to EDW as OP, leaving out about 3 kg over-  coming in with too much to pull. HD [place] 2/2.5, Dialyzer 180, Heparin yes. Access [facility]-  resting AVF after infiltration.mircera 150 q 2 weeks, iron 100 weekly- last hgb 9.8, calcitriol 0.25  Past Medical History:  Diagnosis Date   Anemia    Arthritis    hips shoulders   CAD (coronary artery disease) 03/03/2019   Cancer ([place])    Esophageal cancer 2022   CHF (congestive heart failure) ([place]) 2020   Chronic combined systolic and diastolic heart failure ([place]) 04/26/2018   Admitted 11/14-11/20/19-diuresed 10L   Depression 05/31/2021   Fatigue 10/14/2020   Hypertension    Myocardial infarction [facility])    Sleep apnea    uses a bipap machine   Stroke ([place]) 12/14/2020   no last weakness or paralysis   Type 2 diabetes mellitus with diabetic nephropathy ([place]) 04/23/2019   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    Past Surgical History:  Procedure Laterality Date   AV FISTULA PLACEMENT Left 12/21/2021   Procedure: LEFT ARM ARTERIOVENOUS (AV) FISTULA CREATION;  Surgeon: Rosetta Posner, MD;  Location: [facility];  Service: Vascular;  Laterality: Left;   BIOPSY  06/27/2019   Procedure: BIOPSY;  Surgeon: Rogene Houston, MD;  [facility];  Service: Endoscopy;;  ascending colon polyp   BIOPSY  10/21/2020   Procedure: BIOPSY;  Surgeon: Rogene Houston, MD;  [facility];  Service: Endoscopy;;  esophageal,gastric polyp   BIOPSY  02/24/2021   Procedure: BIOPSY;  Surgeon: Rogene Houston, MD;  [facility];  Service: Endoscopy;;  distal and proximal esophageal biopsies    CARDIAC SURGERY     CATARACT EXTRACTION     COLONOSCOPY N/A 06/27/2019   [name]:Diverticulosis in the entire examined colon. tubular adenoma in ascending colon, 67m tubular adenoma in prox sigmoid. external hemorrhoids   CORONARY ARTERY BYPASS GRAFT N/A 03/25/2019   Procedure: CORONARY ARTERY BYPASS GRAFTING (CABG) X  4 USING LEFT INTERNAL MAMMARY ARTERY AND RIGHT SAPHENOUS VEIN GRAFTS;  Surgeon: LLajuana Matte MD;  [place]  Service: Open Heart Surgery;  Laterality: N/A;   ESOPHAGEAL BRUSHING  03/08/2022   Procedure: ESOPHAGEAL BRUSHING;  Surgeon: CEloise Harman DO;  [facility];  Service: Endoscopy;;   ESOPHAGOGASTRODUODENOSCOPY (EGD) WITH PROPOFOL N/A 10/21/2020   rehman:Normal hypopharynx.Normal proximal esophagus and mid esophagus.Esophageal mucosal changes consistent with long-segment Barrett's esophagus. (Focal low-grade dysplasia and atypia proximally) z line irregular 37 cm from incisors, 3 cm HH, single gastric polyp (fundic gland) normal duodenal bulb/second portion of duodenum, proximal margin of Barrett's at 34  cm   ESOPHAGOGASTRODUODENOSCOPY (EGD) WITH PROPOFOL N/A 02/24/2021   Procedure: ESOPHAGOGASTRODUODENOSCOPY (EGD) WITH PROPOFOL;  Surgeon: Rogene Houston, MD;  [facility];  Service: Endoscopy;  Laterality: N/A;  1:35   ESOPHAGOGASTRODUODENOSCOPY (EGD) WITH PROPOFOL N/A 03/21/2021   Procedure: ESOPHAGOGASTRODUODENOSCOPY (EGD) WITH PROPOFOL;  Surgeon: Carol Ada, MD;  Location: [place];  Service: Endoscopy;  Laterality: N/A;    ESOPHAGOGASTRODUODENOSCOPY (EGD) WITH PROPOFOL N/A 10/23/2021   Procedure: ESOPHAGOGASTRODUODENOSCOPY (EGD) WITH PROPOFOL;  Surgeon: Doran Stabler, MD;  Location: [place];  Service: Gastroenterology;  Laterality: N/A;   ESOPHAGOGASTRODUODENOSCOPY (EGD) WITH PROPOFOL N/A 03/08/2022   Procedure: ESOPHAGOGASTRODUODENOSCOPY (EGD) WITH PROPOFOL;  Surgeon: Eloise Harman, DO;  [facility];  Service: Endoscopy;  Laterality: N/A;   EXCISION MORTON'S NEUROMA     EYE SURGERY Left    retina   INCISION AND DRAINAGE OF WOUND Right 06/28/2021   Procedure: IRRIGATION AND DEBRIDEMENT WOUND;  Surgeon: [name], MD;  Location: [place];  Service: Plastics;  Laterality: Right;  1.5 hour   INSERTION OF DIALYSIS CATHETER Right 12/21/2021   Procedure: INSERTION OF TUNNELED DIALYSIS CATHETER;  Surgeon: Rosetta Posner, MD;  [facility];  Service: Vascular;  Laterality: Right;   LEFT HEART CATH AND CORS/GRAFTS ANGIOGRAPHY N/A 02/01/2022   Procedure: LEFT HEART CATH AND CORS/GRAFTS ANGIOGRAPHY;  Surgeon: Martinique, Peter M, MD;  Location: [place] [facility];  Service: Cardiovascular;  Laterality: N/A;   POLYPECTOMY  06/27/2019   Procedure: POLYPECTOMY;  Surgeon: Rogene Houston, MD;  [facility];  Service: Endoscopy;;  proximal sigmoid colon   RIGHT/LEFT HEART CATH AND CORONARY ANGIOGRAPHY N/A 03/12/2019   Procedure: RIGHT/LEFT HEART CATH AND CORONARY ANGIOGRAPHY;  Surgeon: [name], MD;  Location: [place] [facility];  Service: Cardiovascular;  Laterality: N/A;   SHOULDER ARTHROSCOPY WITH ROTATOR CUFF REPAIR AND SUBACROMIAL DECOMPRESSION Right 08/26/2020   Procedure: RIGHT SHOULDER MINI OPEN ROTATOR CUFF REPAIR AND SUBACROMIAL DECOMPRESSION WITH PATCH GRAFT;  Surgeon: Susa Day, MD;  Location: [facility];  Service: Orthopedics;  Laterality: Right;  90 MINS GENERAL WITH BLOCK   SKIN SPLIT GRAFT Right 06/28/2021   Procedure: SKIN GRAFT SPLIT THICKNESS;  Surgeon: [name], MD;  Location: [place];  Service: Plastics;  Laterality: Right;   TEE WITHOUT CARDIOVERSION N/A 03/25/2019   Procedure: TRANSESOPHAGEAL ECHOCARDIOGRAM (TEE);  Surgeon: [name], MD;  Location: [place];  Service: Open Heart Surgery;  Laterality: N/A;    Family History  Problem Relation Age of Onset   Colon cancer Mother    Heart Problems Father    Diabetes Father    Valvular heart disease Father    Sleep apnea Neg Hx    Stroke Neg Hx     Social History:  reports that he quit smoking about 33 years ago. His smoking use included cigarettes. He has been exposed to tobacco smoke. He has never used smokeless tobacco. He reports that he does not currently use alcohol after a past usage of about 2.0 standard drinks of alcohol per week. He reports that he does not use drugs.  Allergies:  Allergies  Allergen Reactions   Bee Venom Anaphylaxis   Atorvastatin Other (See Comments)    myalgia   Diltiazem Itching   Reglan [Metoclopramide] Other (See Comments)    Suicidal    Rosuvastatin Other (See Comments)    myalgias   Valsartan Itching    Medications: I have reviewed the patient's current medications.  Results for orders placed or performed during the hospital encounter of 05/03/22 (from the past 48 hour(s))  Brain natriuretic peptide     Status: Abnormal   Collection Time: 05/03/22  2:08 PM  Result Value Ref Range   B Natriuretic Peptide >4,500.0 (H) 0.0 - 100.0 pg/mL    Comment: Performed at [facility], [address] [place], [place] [address]  CBC with Differential     Status: Abnormal   Collection Time: 05/03/22  2:10 PM  Result Value Ref Range   WBC 10.1 4.0 - 10.5 K/uL   RBC 3.67 (L) 4.22 - 5.81 MIL/uL   Hemoglobin 9.9 (L) 13.0 - 17.0 g/dL   HCT 31.8 (L) 39.0 - 52.0 %   MCV 86.6 80.0 - 100.0 fL   MCH 27.0 26.0 - 34.0 pg   MCHC 31.1 30.0 - 36.0 g/dL   RDW 18.9 (H) 11.5 - 15.5 %   Platelets 206 150 - 400 K/uL   nRBC 0.0 0.0 - 0.2 %   Neutrophils  Relative % 92 %   Neutro Abs 9.5 (H) 1.7 - 7.7 K/uL   Lymphocytes Relative 2 %   Lymphs Abs 0.2 (L) 0.7 - 4.0 K/uL   Monocytes Relative 3 %   Monocytes Absolute 0.3 0.1 - 1.0 K/uL   Eosinophils Relative 1 %   Eosinophils Absolute 0.1 0.0 - 0.5 K/uL   Basophils Relative 1 %   Basophils Absolute 0.1 0.0 - 0.1 K/uL   Immature Granulocytes 1 %   Abs Immature Granulocytes 0.05 0.00 - 0.07 K/uL    Comment: Performed at [facility], [address], [place], [place] [address]  D-dimer, quantitative     Status: Abnormal   Collection Time: 05/03/22  2:10 PM  Result Value Ref Range   D-Dimer, Quant 2.26 (H) 0.00 - 0.50 ug/mL-FEU    Comment: (NOTE) At the manufacturer cut-off value of 0.5 g/mL FEU, this assay has a negative predictive value of 95-100%.This assay is intended for use in conjunction with a clinical pretest probability (PTP) assessment model to exclude pulmonary embolism (PE) and deep venous thrombosis (DVT) in outpatients suspected of PE or DVT. Results should be correlated with clinical presentation. Performed at [facility], [address] [place], [place] [address]   Comprehensive metabolic panel     Status: Abnormal   Collection Time: 05/03/22  2:10 PM  Result Value Ref Range   Sodium 138 135 - 145 mmol/L   Potassium 3.2 (L) 3.5 - 5.1 mmol/L   Chloride 98 98 - 111 mmol/L   CO2 27 22 - 32 mmol/L   Glucose, Bld 116 (H) 70 - 99 mg/dL    Comment: Glucose reference range applies only to samples taken after fasting for at least 8 hours.   BUN 21 8 - 23 mg/dL   Creatinine, Ser 2.88 (H) 0.61 - 1.24 mg/dL   Calcium 8.7 (L) 8.9 - 10.3 mg/dL   Total Protein 6.7 6.5 - 8.1 g/dL   Albumin 3.5 3.5 - 5.0 g/dL   AST 16 15 - 41 U/L   ALT 15 0 - 44 U/L   Alkaline Phosphatase 131 (H) 38 - 126 U/L   Total Bilirubin 1.4 (H) 0.3 - 1.2 mg/dL   GFR, Estimated 23 (L) >60 mL/min    Comment: (NOTE) Calculated using the CKD-EPI Creatinine Equation (2021)    Anion gap 13 5 - 15    Comment:  Performed at [facility], [address], [place],  [address]  Lactic acid, plasma     Status: None   Collection Time: 05/03/22  2:11 PM  Result Value Ref Range   Lactic Acid, Venous 1.1 0.5 - 1.9 mmol/L    Comment: Performed at [facility], [address] [place], [place] [address]  Culture, blood (routine x 2)     Status: None (Preliminary result)   Collection Time: 05/03/22  2:37 PM   Specimen: BLOOD  Result Value Ref Range   Specimen Description BLOOD BLOOD RIGHT HAND    Special Requests      BOTTLES DRAWN AEROBIC AND ANAEROBIC Blood Culture adequate volume Performed at [facility], [address], [place], [place] [address]    Culture PENDING    Report Status PENDING   Culture, blood (routine x 2)     Status: None (Preliminary result)   Collection Time: 05/03/22  2:42 PM   Specimen: BLOOD  Result Value Ref Range   Specimen Description BLOOD BLOOD LEFT HAND    Special Requests      Blood Culture results may not be optimal due to an excessive volume of blood received in culture bottles BOTTLES DRAWN AEROBIC AND ANAEROBIC Performed at [facility], [address], [place], [place] [address]    Culture PENDING    Report Status PENDING   HIV Antibody (routine testing w rflx)     Status: None   Collection Time: 05/03/22  2:47 PM  Result Value Ref Range   HIV Screen 4th Generation wRfx Non Reactive Non Reactive    Comment: Performed at [facility], [place] [address], [place], [place] [address]  Resp Panel by RT-PCR (Flu A&B, Covid) Anterior Nasal Swab     Status: None   Collection Time: 05/03/22  3:09 PM   Specimen: Anterior Nasal Swab  Result Value Ref Range   SARS Coronavirus 2 by RT PCR NEGATIVE NEGATIVE    Comment: (NOTE) SARS-CoV-2 target nucleic acids are NOT DETECTED.  The SARS-CoV-2 RNA is generally detectable in upper respiratory specimens during the acute phase of infection. The lowest concentration of SARS-CoV-2 viral copies this assay can detect is 138  copies/mL. A negative result does not preclude SARS-Cov-2 infection and should not be used as the sole basis for treatment or other patient management decisions. A negative result may occur with  improper specimen collection/handling, submission of specimen other than nasopharyngeal swab, presence of viral mutation(s) within the areas targeted by this assay, and inadequate number of viral copies(<138 copies/mL). A negative result must be combined with clinical observations, patient history, and epidemiological information. The expected result is Negative.  Fact Sheet for Patients:  EntrepreneurPulse.com.au  Fact Sheet for Healthcare Providers:  IncredibleEmployment.be  This test is no t yet approved or cleared by the [place] FDA and  has been authorized for detection and/or diagnosis of SARS-CoV-2 by FDA under an Emergency Use Authorization (EUA). This EUA will remain  in effect (meaning this test can be used) for the duration of the COVID-19 declaration under Section 564(b)(1) of the Act, 21 U.S.C.section 360bbb-3(b)(1), unless the authorization is terminated  or revoked sooner.       Influenza A by PCR NEGATIVE NEGATIVE   Influenza B by PCR NEGATIVE NEGATIVE    Comment: (NOTE) The Xpert Xpress SARS-CoV-2/FLU/RSV plus assay is intended as an aid in the diagnosis of influenza from Nasopharyngeal swab specimens and should not be used as a sole basis for treatment. Nasal washings and aspirates are unacceptable for Xpert Xpress SARS-CoV-2/FLU/RSV testing.  Fact Sheet for Patients: EntrepreneurPulse.com.au  Fact Sheet for Healthcare Providers: IncredibleEmployment.be  This test is not yet approved or cleared by the [place] FDA and has been authorized for detection and/or diagnosis of SARS-CoV-2 by FDA under an Emergency Use Authorization (EUA). This EUA will remain in effect (meaning this test  can be used) for the duration of the COVID-19 declaration under Section 564(b)(1) of the Act, 21 U.S.C. section 360bbb-3(b)(1), unless the authorization is terminated or revoked.  Performed at [facility], [address], [place], [place] [address]   Lactic acid, plasma     Status: None   Collection Time: 05/03/22  3:10 PM  Result Value Ref Range   Lactic Acid, Venous 1.2 0.5 - 1.9 mmol/L    Comment: Performed at [facility], [name], [place], [place] [address]  Culture, blood (single) w Reflex to ID Panel     Status: None (Preliminary result)   Collection Time: 05/03/22  8:02 PM   Specimen: BLOOD RIGHT HAND  Result Value Ref Range   Specimen Description BLOOD RIGHT HAND    Special Requests      BOTTLES DRAWN AEROBIC AND ANAEROBIC Blood Culture adequate volume Performed at [facility], [address] [place], [place] [address]    Culture PENDING    Report Status PENDING   CBC     Status: Abnormal   Collection Time: 05/04/22  3:14 AM  Result Value Ref Range   WBC 14.3 (H) 4.0 - 10.5 K/uL   RBC 3.66 (L) 4.22 - 5.81 MIL/uL   Hemoglobin 9.9 (L) 13.0 - 17.0 g/dL   HCT 32.5 (L) 39.0 - 52.0 %   MCV 88.8 80.0 - 100.0 fL   MCH 27.0 26.0 - 34.0 pg   MCHC 30.5 30.0 - 36.0 g/dL   RDW 19.4 (H) 11.5 - 15.5 %   Platelets 205 150 - 400 K/uL   nRBC 0.0 0.0 - 0.2 %    Comment: Performed at [facility], [address] [place], [place] [address]  Renal function panel     Status: Abnormal   Collection Time: 05/04/22  3:14 AM  Result Value Ref Range   Sodium 138 135 - 145 mmol/L   Potassium 3.8 3.5 - 5.1 mmol/L   Chloride 100 98 - 111 mmol/L   CO2 27 22 - 32 mmol/L   Glucose, Bld 158 (H) 70 - 99 mg/dL    Comment: Glucose reference range applies only to samples taken after fasting for at least 8 hours.   BUN 27 (H) 8 - 23 mg/dL   Creatinine, Ser 3.94 (H) 0.61 - 1.24 mg/dL    Comment: DELTA CHECK NOTED   Calcium 8.3 (L) 8.9 - 10.3 mg/dL   Phosphorus 4.6 2.5 - 4.6 mg/dL   Albumin  3.1 (L) 3.5 - 5.0 g/dL   GFR, Estimated 16 (L) >60 mL/min    Comment: (NOTE) Calculated using the CKD-EPI Creatinine Equation (2021)    Anion gap 11 5 - 15    Comment: Performed at [facility], [address] [place], [place] [address]    CT Angio Chest PE W and/or Wo Contrast  Result Date: 05/03/2022 CLINICAL DATA:  Pulmonary embolism (PE) suspected, low to intermediate prob, positive D-dimer Pt arrived via [facility] c/o 3 hrs and 5 mins of dialysis treatment when he had a sudden onset of Nausea and hypertension. EXAM: CT ANGIOGRAPHY CHEST WITH CONTRAST TECHNIQUE: Multidetector CT imaging of the chest was performed using the standard protocol during  bolus administration of intravenous contrast. Multiplanar CT image reconstructions and MIPs were obtained to evaluate the vascular anatomy. RADIATION DOSE REDUCTION: This exam was performed according to the departmental dose-optimization program which includes automated exposure control, adjustment of the mA and/or kV according to patient size and/or use of iterative reconstruction technique. CONTRAST:  28m OMNIPAQUE IOHEXOL 350 MG/ML SOLN COMPARISON:  Chest XR, concurrent.  CTA chest, 02/28/2022. FINDINGS: Suboptimal evaluation, secondary to motion degradation. Cardiovascular: Satisfactory opacification of the pulmonary arteries to the segmental level. No segmental or larger pulmonary embolus. Dilated main PA, measuring up to 4.2 cm. Multi chamber cardiac enlargement. No pericardial effusion. 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 RIGHT jugular approach dialysis catheter, well-positioned with tip within the proximal RIGHT atrium. Mediastinum/Nodes: No enlarged mediastinal, hilar, or axillary lymph nodes. Thyroid gland, trachea, and esophagus demonstrate no significant findings. Lungs/Pleura: Small volume RIGHT pleural effusion with adjacent dependent atelectasis. Mild bilateral pulmonary interstitial  thickening with trace ground-glass opacities. No suspicious pulmonary nodule or mass. No pneumothorax. Upper Abdomen: No acute abnormality. Musculoskeletal: Postsurgical changes of median sternotomy. No acute chest wall abnormality. No significant osseous findings. Review of the MIP images confirms the above findings. IMPRESSION: Suboptimal evaluation, within these constraints; 1.  No segmental or larger pulmonary embolus. 2. Main pulmonary artery dilation, likely reflecting underlying pulmonary arterial hypertension. 3. Cardiomegaly, small volume RIGHT pleural effusion and interstitial thickening. Findings suspicious for early/developing pulmonary edema. Additional incidental, chronic and senescent findings as above. Electronically Signed   By: JMichaelle BirksM.D.   On: 05/03/2022 17:06   DG Chest Port 1 View  Result Date: 05/03/2022 CLINICAL DATA:  Shortness of breath, onset of nausea and hypertension during dialysis treatment EXAM: PORTABLE CHEST 1 VIEW COMPARISON:  Portable exam 1420 hours compared to 03/03/2022 FINDINGS: RIGHT jugular central venous catheter with tip projecting over RIGHT atrium. Enlargement of cardiac silhouette with slight vascular congestion post CABG. RIGHT pleural effusion and mild RIGHT basilar atelectasis seen. Accentuation of interstitial markings similar to prior exam. Linear atelectasis RIGHT upper lobe unchanged. No pneumothorax or acute osseous findings. IMPRESSION: Enlargement of cardiac silhouette with vascular congestion. Small RIGHT pleural effusion and basilar atelectasis with persistent accentuation of interstitial markings and linear atelectasis in RIGHT upper lobe. Electronically Signed   By: MLavonia DanaM.D.   On: 05/03/2022 14:34    ROS: really negative at this time Blood pressure 128/67, pulse 77, temperature (!) 97.2 F (36.2 C), resp. rate 18, height '5\' 10"'$  (1.778 m), weight 87.1 kg, SpO2 99 %. General appearance: alert, no distress, and flat affect Resp:  diminished breath sounds bibasilar Cardio: regular rate and rhythm, S1, S2 normal, no murmur, click, rub or gallop GI: soft, non-tender; bowel sounds normal; no masses,  no organomegaly Extremities: edema 1-2+ Left AVF-  good thrill and bruit-  slight bruising but pt says better  -  [facility]: 66year old WM with many medical problems to include ESRD-  presents with SOB and hypoxia-  indications of pulmonary edema  1 SOB-  all indicators are of volume overload/pulm edema-  has been coming and staying for treatments but not to EDW as goals are too high.   2 ESRD: normally MWF as OP-  Sun/Tues/Fri holiday schedule this week.  Will plan on extra treatment today focusing on volume removal really only, hoping that might fix the hypoxia issue so that he could go home -  next would be due Friday-  hopefully  at the OP center  3 Hypertension: BP controlled-  holding all BP meds for now-  may need to stop some as OP to improve the UF at HD 4. Anemia of ESRD: is close to where has been as OP and just under 10-  no dosing of ESA here 5. 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 AVF patent but resting after infiltration   Nyana Haren A Azayla Polo 05/04/2022, 7:30 AM

## 2022-05-05 DIAGNOSIS — Z66 Do not resuscitate: Secondary | ICD-10-CM | POA: Diagnosis not present

## 2022-05-05 DIAGNOSIS — E876 Hypokalemia: Secondary | ICD-10-CM | POA: Diagnosis not present

## 2022-05-05 DIAGNOSIS — J9601 Acute respiratory failure with hypoxia: Secondary | ICD-10-CM | POA: Diagnosis not present

## 2022-05-05 DIAGNOSIS — I5043 Acute on chronic combined systolic (congestive) and diastolic (congestive) heart failure: Secondary | ICD-10-CM | POA: Diagnosis not present

## 2022-05-05 LAB — RENAL FUNCTION PANEL
Albumin: 3.3 g/dL — ABNORMAL LOW (ref 3.5–5.0)
Anion gap: 13 (ref 5–15)
BUN: 47 mg/dL — ABNORMAL HIGH (ref 8–23)
CO2: 21 mmol/L — ABNORMAL LOW (ref 22–32)
Calcium: 8.2 mg/dL — ABNORMAL LOW (ref 8.9–10.3)
Chloride: 96 mmol/L — ABNORMAL LOW (ref 98–111)
Creatinine, Ser: 5.49 mg/dL — ABNORMAL HIGH (ref 0.61–1.24)
GFR, Estimated: 11 mL/min — ABNORMAL LOW (ref >60)
Glucose, Bld: 214 mg/dL — ABNORMAL HIGH (ref 70–99)
Phosphorus: 4.6 mg/dL (ref 2.5–4.6)
Potassium: 4.2 mmol/L (ref 3.5–5.1)
Sodium: 130 mmol/L — ABNORMAL LOW (ref 135–145)

## 2022-05-05 LAB — GLUCOSE, CAPILLARY
Glucose-Capillary: 247 mg/dL — ABNORMAL HIGH (ref 70–99)
Glucose-Capillary: 209 mg/dL — ABNORMAL HIGH (ref 70–99)

## 2022-05-05 LAB — CBC WITH DIFFERENTIAL/PLATELET
Abs Immature Granulocytes: 0.05 10*3/uL (ref 0.00–0.07)
Basophils Absolute: 0.1 10*3/uL (ref 0.0–0.1)
Basophils Relative: 1 %
Eosinophils Absolute: 0.2 10*3/uL (ref 0.0–0.5)
Eosinophils Relative: 2 %
HCT: 34 % — ABNORMAL LOW (ref 39.0–52.0)
Hemoglobin: 10.3 g/dL — ABNORMAL LOW (ref 13.0–17.0)
Immature Granulocytes: 1 %
Lymphocytes Relative: 10 %
Lymphs Abs: 1.1 10*3/uL (ref 0.7–4.0)
MCH: 26.5 pg (ref 26.0–34.0)
MCHC: 30.3 g/dL (ref 30.0–36.0)
MCV: 87.6 fL (ref 80.0–100.0)
Monocytes Absolute: 1 10*3/uL (ref 0.1–1.0)
Monocytes Relative: 9 %
Neutro Abs: 8.6 10*3/uL — ABNORMAL HIGH (ref 1.7–7.7)
Neutrophils Relative %: 77 %
Platelets: 227 10*3/uL (ref 150–400)
RBC: 3.88 MIL/uL — ABNORMAL LOW (ref 4.22–5.81)
RDW: 18.8 % — ABNORMAL HIGH (ref 11.5–15.5)
WBC: 10.9 10*3/uL — ABNORMAL HIGH (ref 4.0–10.5)
nRBC: 0 % (ref 0.0–0.2)

## 2022-05-05 LAB — HEPATITIS B SURFACE ANTIBODY, QUANTITATIVE: Hep B S AB Quant (Post): 55.1 m[IU]/mL (ref >9.9)

## 2022-05-05 MED ORDER — METOPROLOL SUCCINATE ER 50 MG PO TB24
50.0000 mg | ORAL_TABLET | Freq: Every day | ORAL | Status: DC
  Administered 2022-05-05: 50 mg via ORAL
  Filled 2022-05-05: qty 1

## 2022-05-05 MED ORDER — ASPIRIN 325 MG PO TBEC
325.0000 mg | DELAYED_RELEASE_TABLET | Freq: Every day | ORAL | Status: DC
  Administered 2022-05-05: 325 mg via ORAL
  Filled 2022-05-05: qty 1

## 2022-05-05 MED ORDER — HYDRALAZINE HCL 25 MG PO TABS
50.0000 mg | ORAL_TABLET | Freq: Three times a day (TID) | ORAL | Status: DC
  Administered 2022-05-05: 50 mg via ORAL
  Filled 2022-05-05: qty 2

## 2022-05-05 MED ORDER — ARIPIPRAZOLE 5 MG PO TABS
5.0000 mg | ORAL_TABLET | Freq: Every day | ORAL | Status: DC
  Administered 2022-05-05: 5 mg via ORAL
  Filled 2022-05-05: qty 1

## 2022-05-05 MED ORDER — INSULIN ASPART 100 UNIT/ML IJ SOLN
3.0000 [IU] | Freq: Three times a day (TID) | INTRAMUSCULAR | Status: DC
  Administered 2022-05-05: 3 [IU] via SUBCUTANEOUS

## 2022-05-05 MED ORDER — OYSTER SHELL CALCIUM/D3 500-5 MG-MCG PO TABS
1.0000 | ORAL_TABLET | Freq: Two times a day (BID) | ORAL | Status: DC
  Administered 2022-05-05: 1 via ORAL
  Filled 2022-05-05: qty 1

## 2022-05-05 MED ORDER — AMIODARONE HCL 200 MG PO TABS: 200.0000 mg | ORAL_TABLET | Freq: Every day | ORAL | 1 refills | Status: DC

## 2022-05-05 MED ORDER — PANTOPRAZOLE SODIUM 40 MG PO TBEC
40.0000 mg | DELAYED_RELEASE_TABLET | Freq: Two times a day (BID) | ORAL | Status: DC
  Administered 2022-05-05: 40 mg via ORAL
  Filled 2022-05-05: qty 1

## 2022-05-05 MED ORDER — AMIODARONE HCL 200 MG PO TABS: 400.0000 mg | ORAL_TABLET | Freq: Two times a day (BID) | ORAL | Status: DC

## 2022-05-05 MED ORDER — SERTRALINE HCL 50 MG PO TABS
100.0000 mg | ORAL_TABLET | Freq: Every day | ORAL | Status: DC
  Administered 2022-05-05: 100 mg via ORAL
  Filled 2022-05-05: qty 2

## 2022-05-05 MED ORDER — VITAMIN C 500 MG PO TABS
500.0000 mg | ORAL_TABLET | Freq: Every day | ORAL | Status: DC
  Administered 2022-05-05: 500 mg via ORAL
  Filled 2022-05-05: qty 1

## 2022-05-05 MED ORDER — CALCIUM ACETATE (PHOS BINDER) 667 MG PO CAPS: 667.0000 mg | ORAL_CAPSULE | Freq: Three times a day (TID) | ORAL | Status: DC

## 2022-05-05 MED ORDER — AMIODARONE HCL 200 MG PO TABS
200.0000 mg | ORAL_TABLET | Freq: Every day | ORAL | Status: DC
  Administered 2022-05-05: 200 mg via ORAL
  Filled 2022-05-05: qty 1

## 2022-05-05 MED ORDER — SODIUM CHLORIDE 0.9 % IV SOLN: 2.0000 g | INTRAVENOUS | Status: DC

## 2022-05-05 MED ORDER — ISOSORBIDE DINITRATE 20 MG PO TABS
30.0000 mg | ORAL_TABLET | Freq: Three times a day (TID) | ORAL | Status: DC
  Administered 2022-05-05: 30 mg via ORAL
  Filled 2022-05-05: qty 2

## 2022-05-05 MED ORDER — DOXAZOSIN MESYLATE 2 MG PO TABS
2.0000 mg | ORAL_TABLET | Freq: Every day | ORAL | Status: DC
  Administered 2022-05-05: 2 mg via ORAL
  Filled 2022-05-05: qty 1

## 2022-05-05 NOTE — Discharge Instructions (Addendum)
PLEASE GO TO REGULAR DIALYSIS AS SCHEDULED.  THEY WILL CONTINUE TO GIVE ANTIBIOTICS WITH DIALYSIS.     IMPORTANT INFORMATION: PAY CLOSE ATTENTION   PHYSICIAN DISCHARGE INSTRUCTIONS  Follow with Primary care provider  [name], MD  and other consultants as instructed by your Hospitalist Physician  [place] IF SYMPTOMS COME BACK, WORSEN OR NEW PROBLEM DEVELOPS   Please note: You were cared for by a hospitalist during your hospital stay. Every effort will be made to forward records to your primary care provider.  You can request that your primary care provider send for your hospital records if they have not received them.  Once you are discharged, your primary care physician will handle any further medical issues. Please note that NO REFILLS for any discharge medications will be authorized once you are discharged, as it is imperative that you return to your primary care physician (or establish a relationship with a primary care physician if you do not have one) for your post hospital discharge needs so that they can reassess your need for medications and monitor your lab values.  Please get a complete blood count and chemistry panel checked by your Primary MD at your next visit, and again as instructed by your Primary MD.  Get Medicines reviewed and adjusted: Please take all your medications with you for your next visit with your Primary MD  Laboratory/radiological data: Please request your Primary MD to go over all hospital tests and procedure/radiological results at the follow up, please ask your primary care provider to get all Hospital records sent to his/her office.  In some cases, they will be blood work, cultures and biopsy results pending at the time of your discharge. Please request that your primary care provider follow up on these results.  If you are diabetic, please bring your blood sugar readings with you to your follow up appointment  with primary care.    Please call and make your follow up appointments as soon as possible.    Also Note the following: If you experience worsening of your admission symptoms, develop shortness of breath, life threatening emergency, suicidal or homicidal thoughts you must seek medical attention immediately by calling 911 or calling your MD immediately  if symptoms less severe.  You must read complete instructions/literature along with all the possible adverse reactions/side effects for all the Medicines you take and that have been prescribed to you. Take any new Medicines after you have completely understood and accpet all the possible adverse reactions/side effects.   Do not drive when taking Pain medications or sleeping medications (Benzodiazepines)  Do not take more than prescribed Pain, Sleep and Anxiety Medications. It is not advisable to combine anxiety,sleep and pain medications without talking with your primary care practitioner  Special Instructions: If you have smoked or chewed Tobacco  in the last 2 yrs please stop smoking, stop any regular Alcohol  and or any Recreational drug use.  Wear Seat belts while driving.  Do not drive if taking any narcotic, mind altering or controlled substances or recreational drugs or alcohol.

## 2022-05-05 NOTE — Discharge Summary (Signed)
Physician Discharge Summary  [name] EYC:[id number] DOB: Nov 02, 1955 DOA: 05/03/2022  PCP: [name], MD  Admit date: 05/03/2022 Discharge date: 05/05/2022  Admitted From:  HOME  Disposition: [place]   Recommendations for Outpatient Follow-up:  Go to regular hemodialysis treatment as schedule Continue IV cefepime 2 gm with every HD treatment   Home Health: PT   Discharge Condition: STABLE   CODE STATUS: DNR DIET: renal     Brief Hospitalization Summary: Please see all hospital notes, images, labs for full details of the hospitalization. ADMISSION HPI:  66 y.o. male with medical history significant of hypertension, CAD s/p CABG, combined systolic and diastolic CHF, SVT, CVA, esophageal adenocarcinoma, diabetes mellitus type II, presents with acute onset of nausea and shortness of breath.  Due to the holiday weekend and his hemodialysis schedule had gotten changed from Monday/Wednesday/Friday and he had been scheduled for hemodialysis this morning.  He had eaten some of his breakfast this morning and vomited.  Emesis was just of food contents and nonbloody in appearance.  Thereafter patient reported feeling okay and his wife took him to hemodialysis.  He was on the machine for approximately 3 hours and developed nausea again.  He was noted to be hypertensive at the time and O2 saturations dropped down to as low as 88% on room air.  Normally patient is not on oxygen at baseline.  He denied having any recent fever, chest pain, headache, dizziness, abdominal pain, diarrhea, and he did not vomit again.  He has been on antibiotics for treatment of a right toe wound that he sustained.  Denies having any redness or erythema from the wound.  He is supposed to be set up to see podiatry in the outpatient setting.     Last hospitalized 9/18-9/26 for acute respiratory failure hypoxia while receiving hemodialysis patient was noted to have a large right-sided pleural effusion for  which patient underwent thoracentesis removing 1.3 L of fluid.   In the emergency department patient was noted to be tachypneic with blood pressures 120/56-166/83, and O2 saturations currently maintained on 3 L of nasal cannula oxygen.  Labs noted hemoglobin 9.9, potassium 3.2, BUN 21, creatinine 2.88, [place] phosphatase 131, total bilirubin 1.4, D-dimer 2.26, and BNP > 4500.  CT angiogram of the chest was done which noted no overt pulmonary embolism, cardiomegaly, small right pleural effusion, and findings suspicious for early/developing pulmonary edema.  HOSPITAL COURSE BY PROBLEM   Enterobacter cloacae complex Bacteremia  - discussed with pharm D - pharm D recommended Cefepime 2gm IV with each HD treatment - source thought to be [facility] - discussed with nephrologist Dr. Moshe Cipro who felt it was ok for pt to discharge home today and she will make arrangements for him to continue antibiotics with outpatient HD treatments and arrange for [facility] to be removed and to start using his AV fistula again.  Pt is agreeable to this and he is nontoxic appearing, WBC down and vitals are stable.     Acute respiratory failure with hypoxia secondary to fluid overload with ESRD on HD  - HD planned for 11/22 for volume removal and his respiratory distress has resolved    Combined systolic and diastolic CHF Acute on chronic.  Patient with at least 1+ pitting bilateral lower extremity edema and JVD present.  Last echocardiogram noted EF to be 40 -45% with global hypokinesis of the left ventricle. -Fluid management with hemodialysis - He has diuresed 5L and feeling much better now.  DC  home today.    Hypokalemia Acute.  Potassium 3.2 on admission. -treated and resolved    Essential hypertension -Blood pressure currently controlled  -Resume home medication regimen   Dysphagia -Aspiration precautions with dysphagia 3 diet   Diabetes mellitus type 2 Last hemoglobin A1c was 7.7. -resume home treatment program  at discharge   Esophageal adenocarcinoma Follows with [facility], Dr. [name]. Status post EMR, cryotherapy, and most recently RFA in April 2023. 02/10/22 PET scan showed no local recurrence.Considered poor candidate for esophagectomy. After multidisciplinary discussion the plan was to continue with endoscopic treatments, and continue to follow-up with oncology for surveillance and any signs of progression of disease -Continue outpatient follow-up   CAD Status post CABG in 2020.  Patient had recent cardiac cath 02/01/2022 which showed patent grafts, severe three-vessel native CAD.   -Continue aspirin, Coreg, and Imdur   Anemia chronic disease Hemoglobin 10 g/dL which appears improved from prior. -CBC will be monitored outpatient    OSA -Continue CPAP nightly   DNR Confirmed by the patient with his wife present at bedside  Leukocytosis  - WBC down to 10 after antibiotics    Discharge Diagnoses:  Principal Problem:   Acute respiratory failure with hypoxia ([place]) Active Problems:   Essential hypertension, benign   DM type 2 causing vascular disease ([place])   Coronary artery disease involving coronary bypass graft of native heart with other forms of angina pectoris (HCC)   Anemia of chronic disease   Obstructive sleep apnea   Acute on chronic combined systolic and diastolic CHF (congestive heart failure) ([place])   ESRD on dialysis ([place])   DNR (do not resuscitate)   Esophageal adenocarcinoma ([place])   Dysphagia   Fluid overload   Hypokalemia   Discharge Instructions:  Allergies as of 05/05/2022       Reactions   Bee Venom Anaphylaxis   Atorvastatin Other (See Comments)   myalgia   Diltiazem Itching   Reglan [metoclopramide] Other (See Comments)   Suicidal    Rosuvastatin Other (See Comments)   myalgias   Valsartan Itching        Medication List     STOP taking these medications    B-D ULTRAFINE III SHORT PEN 31G X 8 MM Misc Generic drug: Insulin Pen Needle    carvedilol 12.5 MG tablet Commonly known as: COREG       TAKE these medications    acetaminophen 325 MG tablet Commonly known as: TYLENOL Take 2 tablets (650 mg total) by mouth every 6 (six) hours as needed for mild pain, moderate pain or fever.   albuterol 108 (90 Base) MCG/ACT inhaler Commonly known as: VENTOLIN HFA Inhale 2 puffs into the lungs every 6 (six) hours as needed. What changed: reasons to take this   amiodarone 200 MG tablet Commonly known as: PACERONE Take 1 tablet (200 mg total) by mouth daily. 200 mg once daily What changed:  medication strength how much to take when to take this additional instructions   ARIPiprazole 5 MG tablet Commonly known as: ABILIFY Take 5 mg by mouth daily.   ascorbic acid 500 MG tablet Commonly known as: VITAMIN C Take 1 tablet (500 mg total) by mouth daily.   Aspercreme Lidocaine 4 % Generic drug: lidocaine Place 1 patch onto the skin daily. Wear for 12 hours then remove, wait 12 hours before applying a new patch to lower back   aspirin EC 325 MG tablet Take 325 mg by mouth daily.   calcium acetate 667 MG  capsule Commonly known as: PHOSLO Take 667 mg by mouth in the morning, at noon, and at bedtime.   calcium-vitamin D 500-5 MG-MCG tablet Commonly known as: OSCAL WITH D Take 1 tablet by mouth 2 (two) times daily.   ceFEPIme 2 g in sodium chloride 0.9 % 100 mL Inject 2 g into the vein every Monday, Wednesday, and Friday with hemodialysis. Start taking on: May 06, 2022   doxazosin 2 MG tablet Commonly known as: CARDURA Take 1 tablet (2 mg total) by mouth daily.   EPINEPHrine 0.3 mg/0.3 mL Soaj injection Commonly known as: EPI-PEN Inject 0.3 mg into the muscle as needed for anaphylaxis.   feeding supplement (NEPRO CARB STEADY) Liqd Take 237 mLs by mouth 2 (two) times daily with a meal.   hydrALAZINE 50 MG tablet Commonly known as: APRESOLINE TAKE 1 TABLET BY MOUTH EVERY 8 HOURS   isosorbide dinitrate  30 MG tablet Commonly known as: ISORDIL Take 30 mg by mouth 3 (three) times daily.   metoprolol succinate 50 MG 24 hr tablet Commonly known as: TOPROL-XL Take 1 tablet (50 mg total) by mouth daily. Take with or immediately following a meal.   nitroGLYCERIN 0.4 MG SL tablet Commonly known as: NITROSTAT Place 1 tablet (0.4 mg total) under the tongue every 5 (five) minutes as needed for chest pain.   pantoprazole 40 MG tablet Commonly known as: PROTONIX TAKE 1 TABLET BY MOUTH TWICE DAILY   Repatha SureClick 007 MG/ML Soaj Generic drug: Evolocumab Inject 140 mg as directed every 14 (fourteen) days. What changed: additional instructions   sertraline 100 MG tablet Commonly known as: ZOLOFT 1 daily What changed:  how much to take how to take this when to take this additional instructions   Theratrum Complete Tabs Take 1 tablet by mouth daily.        Follow-up Information     Hemodialysis. Go to.   Why: as scheduled               Allergies  Allergen Reactions   Bee Venom Anaphylaxis   Atorvastatin Other (See Comments)    myalgia   Diltiazem Itching   Reglan [Metoclopramide] Other (See Comments)    Suicidal    Rosuvastatin Other (See Comments)    myalgias   Valsartan Itching   Allergies as of 05/05/2022       Reactions   Bee Venom Anaphylaxis   Atorvastatin Other (See Comments)   myalgia   Diltiazem Itching   Reglan [metoclopramide] Other (See Comments)   Suicidal    Rosuvastatin Other (See Comments)   myalgias   Valsartan Itching        Medication List     STOP taking these medications    B-D ULTRAFINE III SHORT PEN 31G X 8 MM Misc Generic drug: Insulin Pen Needle   carvedilol 12.5 MG tablet Commonly known as: COREG       TAKE these medications    acetaminophen 325 MG tablet Commonly known as: TYLENOL Take 2 tablets (650 mg total) by mouth every 6 (six) hours as needed for mild pain, moderate pain or fever.   albuterol 108 (90  Base) MCG/ACT inhaler Commonly known as: VENTOLIN HFA Inhale 2 puffs into the lungs every 6 (six) hours as needed. What changed: reasons to take this   amiodarone 200 MG tablet Commonly known as: PACERONE Take 1 tablet (200 mg total) by mouth daily. 200 mg once daily What changed:  medication strength how much to take when to  take this additional instructions   ARIPiprazole 5 MG tablet Commonly known as: ABILIFY Take 5 mg by mouth daily.   ascorbic acid 500 MG tablet Commonly known as: VITAMIN C Take 1 tablet (500 mg total) by mouth daily.   Aspercreme Lidocaine 4 % Generic drug: lidocaine Place 1 patch onto the skin daily. Wear for 12 hours then remove, wait 12 hours before applying a new patch to lower back   aspirin EC 325 MG tablet Take 325 mg by mouth daily.   calcium acetate 667 MG capsule Commonly known as: PHOSLO Take 667 mg by mouth in the morning, at noon, and at bedtime.   calcium-vitamin D 500-5 MG-MCG tablet Commonly known as: OSCAL WITH D Take 1 tablet by mouth 2 (two) times daily.   ceFEPIme 2 g in sodium chloride 0.9 % 100 mL Inject 2 g into the vein every Monday, Wednesday, and Friday with hemodialysis. Start taking on: May 06, 2022   doxazosin 2 MG tablet Commonly known as: CARDURA Take 1 tablet (2 mg total) by mouth daily.   EPINEPHrine 0.3 mg/0.3 mL Soaj injection Commonly known as: EPI-PEN Inject 0.3 mg into the muscle as needed for anaphylaxis.   feeding supplement (NEPRO CARB STEADY) Liqd Take 237 mLs by mouth 2 (two) times daily with a meal.   hydrALAZINE 50 MG tablet Commonly known as: APRESOLINE TAKE 1 TABLET BY MOUTH EVERY 8 HOURS   isosorbide dinitrate 30 MG tablet Commonly known as: ISORDIL Take 30 mg by mouth 3 (three) times daily.   metoprolol succinate 50 MG 24 hr tablet Commonly known as: TOPROL-XL Take 1 tablet (50 mg total) by mouth daily. Take with or immediately following a meal.   nitroGLYCERIN 0.4 MG SL  tablet Commonly known as: NITROSTAT Place 1 tablet (0.4 mg total) under the tongue every 5 (five) minutes as needed for chest pain.   pantoprazole 40 MG tablet Commonly known as: PROTONIX TAKE 1 TABLET BY MOUTH TWICE DAILY   Repatha SureClick 622 MG/ML Soaj Generic drug: Evolocumab Inject 140 mg as directed every 14 (fourteen) days. What changed: additional instructions   sertraline 100 MG tablet Commonly known as: ZOLOFT 1 daily What changed:  how much to take how to take this when to take this additional instructions   Theratrum Complete Tabs Take 1 tablet by mouth daily.        Procedures/Studies: CT Angio Chest PE W and/or Wo Contrast  Result Date: 05/03/2022 CLINICAL DATA:  Pulmonary embolism (PE) suspected, low to intermediate prob, positive D-dimer Pt arrived via [facility] c/o 3 hrs and 5 mins of dialysis treatment when he had a sudden onset of Nausea and hypertension. EXAM: CT ANGIOGRAPHY CHEST WITH CONTRAST TECHNIQUE: Multidetector CT imaging of the chest was performed using the standard protocol during bolus administration of intravenous contrast. Multiplanar CT image reconstructions and MIPs were obtained to evaluate the vascular anatomy. RADIATION DOSE REDUCTION: This exam was performed according to the departmental dose-optimization program which includes automated exposure control, adjustment of the mA and/or kV according to patient size and/or use of iterative reconstruction technique. CONTRAST:  83m OMNIPAQUE IOHEXOL 350 MG/ML SOLN COMPARISON:  Chest XR, concurrent.  CTA chest, 02/28/2022. FINDINGS: Suboptimal evaluation, secondary to motion degradation. Cardiovascular: Satisfactory opacification of the pulmonary arteries to the segmental level. No segmental or larger pulmonary embolus. Dilated main PA, measuring up to 4.2 cm. Multi chamber cardiac enlargement. No pericardial effusion. Multivessel coronary bypass, with severe atherosclerosis of the native coronary  vessels. RIGHT jugular  approach dialysis catheter, well-positioned with tip within the proximal RIGHT atrium. Mediastinum/Nodes: No enlarged mediastinal, hilar, or axillary lymph nodes. Thyroid gland, trachea, and esophagus demonstrate no significant findings. Lungs/Pleura: Small volume RIGHT pleural effusion with adjacent dependent atelectasis. Mild bilateral pulmonary interstitial thickening with trace ground-glass opacities. No suspicious pulmonary nodule or mass. No pneumothorax. Upper Abdomen: No acute abnormality. Musculoskeletal: Postsurgical changes of median sternotomy. No acute chest wall abnormality. No significant osseous findings. Review of the MIP images confirms the above findings. IMPRESSION: Suboptimal evaluation, within these constraints; 1.  No segmental or larger pulmonary embolus. 2. Main pulmonary artery dilation, likely reflecting underlying pulmonary arterial hypertension. 3. Cardiomegaly, small volume RIGHT pleural effusion and interstitial thickening. Findings suspicious for early/developing pulmonary edema. Additional incidental, chronic and senescent findings as above. Electronically Signed   By: Michaelle Birks M.D.   On: 05/03/2022 17:06   DG Chest Port 1 View  Result Date: 05/03/2022 CLINICAL DATA:  Shortness of breath, onset of nausea and hypertension during dialysis treatment EXAM: PORTABLE CHEST 1 VIEW COMPARISON:  Portable exam 1420 hours compared to 03/03/2022 FINDINGS: RIGHT jugular central venous catheter with tip projecting over RIGHT atrium. Enlargement of cardiac silhouette with slight vascular congestion post CABG. RIGHT pleural effusion and mild RIGHT basilar atelectasis seen. Accentuation of interstitial markings similar to prior exam. Linear atelectasis RIGHT upper lobe unchanged. No pneumothorax or acute osseous findings. IMPRESSION: Enlargement of cardiac silhouette with vascular congestion. Small RIGHT pleural effusion and basilar atelectasis with persistent  accentuation of interstitial markings and linear atelectasis in RIGHT upper lobe. Electronically Signed   By: Lavonia Dana M.D.   On: 05/03/2022 14:34   VAS US DUPLEX DIALYSIS ACCESS (AVF, AVG)  Result Date: 04/22/2022 DIALYSIS ACCESS Patient Name:  [name]  Date of Exam:   04/20/2022 Medical Rec #: [id number]         Accession #:    [id number] Date of Birth: September 23, 1955         Patient Gender: M Patient Age:   38 years Exam Location:  [facility] Procedure:      VAS US DUPLEX DIALYSIS ACCESS (AVF, AVG) Referring Phys: TODD EARLY --------------------------------------------------------------------------------  Reason for Exam: Unable to dialyze through AVF/AVG. Access Site: Left Upper Extremity. Access Type: Brachial-cephalic AVF. History: Created on 12/21/21. First several attempts at using AVF have been          unsuccessful. Performing Technologist: Ralene Cork RVT  Examination Guidelines: A complete evaluation includes B-mode imaging, spectral Doppler, color Doppler, and power Doppler as needed of all accessible portions of each vessel. Unilateral testing is considered an integral part of a complete examination. Limited examinations for reoccurring indications may be performed as noted.  Findings: +--------------------+----------+-----------------+--------+ AVF                 PSV (cm/s)Flow Vol (mL/min)Comments +--------------------+----------+-----------------+--------+ Native artery inflow   401          1962                +--------------------+----------+-----------------+--------+ AVF Anastomosis        767                              +--------------------+----------+-----------------+--------+  +------------+----------+-------------+----------+--------------+ OUTFLOW VEINPSV (cm/s)Diameter (cm)Depth (cm)   Describe    +------------+----------+-------------+----------+--------------+ Prox UA        147        0.76  0.66                   +------------+----------+-------------+----------+--------------+ Mid UA         140        0.63        0.76                  +------------+----------+-------------+----------+--------------+ Dist UA        210        0.79        0.61   branch 0.41 cm +------------+----------+-------------+----------+--------------+ AC Fossa       170        0.91        0.47                  +------------+----------+-------------+----------+--------------+   Summary: Patent left BCAVF. No significant stenosis. Branch as above. Wall thickening in the proximal upper arm.  *See table(s) above for measurements and observations.  Diagnosing physician: Orlie Pollen Electronically signed by Orlie Pollen on 04/22/2022 at 10:17:52 AM.   --------------------------------------------------------------------------------   Final      Subjective: Pt says he feels great and his appetite is good and he wants to go home.    Discharge Exam: Vitals:   05/05/22 0305 05/05/22 0913  BP: (!) 165/82 (!) 145/72  Pulse: 80 85  Resp: 19   Temp: 98.2 F (36.8 C)   SpO2: 97%    Vitals:   05/04/22 2111 05/05/22 0305 05/05/22 0500 05/05/22 0913  BP: 136/63 (!) 165/82  (!) 145/72  Pulse: 87 80  85  Resp: 20 19    Temp: (!) 97.3 F (36.3 C) 98.2 F (36.8 C)    TempSrc:      SpO2: 93% 97%    Weight:   84.3 kg   Height:       General: Pt is alert, awake, not in acute distress Cardiovascular: normal S1/S2 +, no rubs, no gallops Respiratory: CTA bilaterally, no wheezing, no rhonchi Abdominal: Soft, NT, ND, bowel sounds + Extremities: no edema, no cyanosis   The results of significant diagnostics from this hospitalization (including imaging, microbiology, ancillary and laboratory) are listed below for reference.     Microbiology: Recent Results (from the past 240 hour(s))  Culture, blood (routine x 2)     Status: None (Preliminary result)   Collection Time: 05/03/22  2:37 PM   Specimen: BLOOD  Result Value Ref  Range Status   Specimen Description   Final    BLOOD BLOOD RIGHT HAND Performed at [facility], [address] [place], [place] [address]    Special Requests   Final    BOTTLES DRAWN AEROBIC AND ANAEROBIC Blood Culture adequate volume Performed at [facility], [address], [place], [place] [address]    Culture  Setup Time   Final    Seymour M. Rossen RN at 1007 05/04/22  R.[place] AEROBIC BOTTLE ONLY Organism ID to follow Performed at [facility], [place] [address] [place], [place] [address]    Culture GRAM NEGATIVE RODS  Final   Report Status PENDING  Incomplete  Blood Culture ID Panel (Reflexed)     Status: Abnormal   Collection Time: 05/03/22  2:37 PM  Result Value Ref Range Status   Enterococcus faecalis NOT DETECTED NOT DETECTED Final   Enterococcus Faecium NOT DETECTED NOT DETECTED Final   Listeria monocytogenes NOT DETECTED NOT DETECTED Final   Staphylococcus species NOT DETECTED NOT DETECTED Final  Staphylococcus aureus (BCID) NOT DETECTED NOT DETECTED Final   Staphylococcus epidermidis NOT DETECTED NOT DETECTED Final   Staphylococcus lugdunensis NOT DETECTED NOT DETECTED Final   Streptococcus species NOT DETECTED NOT DETECTED Final   Streptococcus agalactiae NOT DETECTED NOT DETECTED Final   Streptococcus pneumoniae NOT DETECTED NOT DETECTED Final   Streptococcus pyogenes NOT DETECTED NOT DETECTED Final   A.calcoaceticus-baumannii NOT DETECTED NOT DETECTED Final   Bacteroides fragilis NOT DETECTED NOT DETECTED Final   Enterobacterales DETECTED (A) NOT DETECTED Final    Comment: Enterobacterales represent a large order of gram negative bacteria, not a single organism. CRITICAL RESULT CALLED TO, READ BACK BY AND VERIFIED WITH: PHARMD LORI P. [id number] FCP    Enterobacter cloacae complex DETECTED (A) NOT DETECTED Final    Comment: CRITICAL RESULT CALLED TO, READ BACK BY AND VERIFIED WITH: PHARMD LORI P. [id number] FCP    Escherichia coli NOT  DETECTED NOT DETECTED Final   Klebsiella aerogenes NOT DETECTED NOT DETECTED Final   Klebsiella oxytoca NOT DETECTED NOT DETECTED Final   Klebsiella pneumoniae NOT DETECTED NOT DETECTED Final   Proteus species NOT DETECTED NOT DETECTED Final   Salmonella species NOT DETECTED NOT DETECTED Final   Serratia marcescens NOT DETECTED NOT DETECTED Final   Haemophilus influenzae NOT DETECTED NOT DETECTED Final   Neisseria meningitidis NOT DETECTED NOT DETECTED Final   Pseudomonas aeruginosa NOT DETECTED NOT DETECTED Final   Stenotrophomonas maltophilia NOT DETECTED NOT DETECTED Final   Candida albicans NOT DETECTED NOT DETECTED Final   Candida auris NOT DETECTED NOT DETECTED Final   Candida glabrata NOT DETECTED NOT DETECTED Final   Candida krusei NOT DETECTED NOT DETECTED Final   Candida parapsilosis NOT DETECTED NOT DETECTED Final   Candida tropicalis NOT DETECTED NOT DETECTED Final   Cryptococcus neoformans/gattii NOT DETECTED NOT DETECTED Final   CTX-M ESBL NOT DETECTED NOT DETECTED Final   Carbapenem resistance IMP NOT DETECTED NOT DETECTED Final   Carbapenem resistance KPC NOT DETECTED NOT DETECTED Final   Carbapenem resistance NDM NOT DETECTED NOT DETECTED Final   Carbapenem resist OXA 48 LIKE NOT DETECTED NOT DETECTED Final   Carbapenem resistance VIM NOT DETECTED NOT DETECTED Final    Comment: Performed at [facility], [address] [place], [place] [address]  Culture, blood (routine x 2)     Status: None (Preliminary result)   Collection Time: 05/03/22  2:42 PM   Specimen: BLOOD LEFT HAND  Result Value Ref Range Status   Specimen Description   Final    BLOOD LEFT HAND Performed at [facility], [place] [address], [place], [place] [address]    Special Requests   Final    Blood Culture results may not be optimal due to an excessive volume of blood received in culture bottles [place] Performed at [facility], [address] [place],  [place] [address]    Culture  Setup Time   Final    Ebony M. Rossen at 1328  05/04/22 R.BYRD AEROBIC BOTTLE ONLY    Culture   Final    GRAM NEGATIVE RODS IDENTIFICATION AND SUSCEPTIBILITIES TO FOLLOW Performed at [facility], [place] [address], [place], [place] [address]    Report Status PENDING  Incomplete  Resp Panel by RT-PCR (Flu A&B, Covid) Anterior Nasal Swab     Status: None   Collection Time: 05/03/22  3:09 PM   Specimen: Anterior Nasal Swab  Result Value Ref  Range Status   SARS Coronavirus 2 by RT PCR NEGATIVE NEGATIVE Final    Comment: (NOTE) SARS-CoV-2 target nucleic acids are NOT DETECTED.  The SARS-CoV-2 RNA is generally detectable in upper respiratory specimens during the acute phase of infection. The lowest concentration of SARS-CoV-2 viral copies this assay can detect is 138 copies/mL. A negative result does not preclude SARS-Cov-2 infection and should not be used as the sole basis for treatment or other patient management decisions. A negative result may occur with  improper specimen collection/handling, submission of specimen other than nasopharyngeal swab, presence of viral mutation(s) within the areas targeted by this assay, and inadequate number of viral copies(<138 copies/mL). A negative result must be combined with clinical observations, patient history, and epidemiological information. The expected result is Negative.  Fact Sheet for Patients:  EntrepreneurPulse.com.au  Fact Sheet for Healthcare Providers:  IncredibleEmployment.be  This test is no t yet approved or cleared by the [place] FDA and  has been authorized for detection and/or diagnosis of SARS-CoV-2 by FDA under an Emergency Use Authorization (EUA). This EUA will remain  in effect (meaning this test can be used) for the duration of the COVID-19 declaration under Section 564(b)(1) of the Act, 21 U.S.C.section 360bbb-3(b)(1), unless the  authorization is terminated  or revoked sooner.       Influenza A by PCR NEGATIVE NEGATIVE Final   Influenza B by PCR NEGATIVE NEGATIVE Final    Comment: (NOTE) The Xpert Xpress SARS-CoV-2/FLU/RSV plus assay is intended as an aid in the diagnosis of influenza from Nasopharyngeal swab specimens and should not be used as a sole basis for treatment. Nasal washings and aspirates are unacceptable for Xpert Xpress SARS-CoV-2/FLU/RSV testing.  Fact Sheet for Patients: EntrepreneurPulse.com.au  Fact Sheet for Healthcare Providers: IncredibleEmployment.be  This test is not yet approved or cleared by the [place] FDA and has been authorized for detection and/or diagnosis of SARS-CoV-2 by FDA under an Emergency Use Authorization (EUA). This EUA will remain in effect (meaning this test can be used) for the duration of the COVID-19 declaration under Section 564(b)(1) of the Act, 21 U.S.C. section 360bbb-3(b)(1), unless the authorization is terminated or revoked.  Performed at [facility], [address] [place], [place] [address]   Culture, blood (single) w Reflex to ID Panel     Status: None (Preliminary result)   Collection Time: 05/03/22  8:02 PM   Specimen: BLOOD RIGHT HAND  Result Value Ref Range Status   Specimen Description BLOOD RIGHT HAND  Final   Special Requests   Final    BOTTLES DRAWN AEROBIC AND ANAEROBIC Blood Culture adequate volume   Culture   Final    NO GROWTH 2 DAYS Performed at [facility], [address], [place], [place] [address]    Report Status PENDING  Incomplete     Labs: BNP (last 3 results) Recent Labs    01/29/22 2150 02/28/22 1249 05/03/22 1408  BNP 1,906.0* >4,500.0* >0,300.9*   Basic Metabolic Panel: Recent Labs  Lab 05/03/22 1410 05/04/22 0314 05/05/22 0353  NA 138 138 130*  K 3.2* 3.8 4.2  CL 98 100 96*  CO2 27 27 21*  GLUCOSE 116* 158* 214*  BUN 21 27* 47*  CREATININE 2.88* 3.94* 5.49*   CALCIUM 8.7* 8.3* 8.2*  PHOS  --  4.6 4.6   Liver Function Tests: Recent Labs  Lab 05/03/22 1410 05/04/22 0314 05/05/22 0353  AST 16  --   --   ALT 15  --   --  ALKPHOS 131*  --   --   BILITOT 1.4*  --   --   PROT 6.7  --   --   ALBUMIN 3.5 3.1* 3.3*   No results for input(s): "LIPASE", "AMYLASE" in the last 168 hours. No results for input(s): "AMMONIA" in the last 168 hours. CBC: Recent Labs  Lab 05/03/22 1410 05/04/22 0314 05/05/22 0353  WBC 10.1 14.3* 10.9*  NEUTROABS 9.5*  --  8.6*  HGB 9.9* 9.9* 10.3*  HCT 31.8* 32.5* 34.0*  MCV 86.6 88.8 87.6  PLT 206 205 227   Cardiac Enzymes: No results for input(s): "CKTOTAL", "CKMB", "CKMBINDEX", "TROPONINI" in the last 168 hours. BNP: Invalid input(s): "POCBNP" CBG: Recent Labs  Lab 05/04/22 1816 05/04/22 2130 05/05/22 0302 05/05/22 0757  GLUCAP 249* 346* 247* 209*   D-Dimer Recent Labs    05/03/22 1410  DDIMER 2.26*   Hgb A1c No results for input(s): "HGBA1C" in the last 72 hours. Lipid Profile No results for input(s): "CHOL", "HDL", "LDLCALC", "TRIG", "CHOLHDL", "LDLDIRECT" in the last 72 hours. Thyroid function studies No results for input(s): "TSH", "T4TOTAL", "T3FREE", "THYROIDAB" in the last 72 hours.  Invalid input(s): "FREET3" Anemia work up No results for input(s): "VITAMINB12", "FOLATE", "FERRITIN", "TIBC", "IRON", "RETICCTPCT" in the last 72 hours. Urinalysis    Component Value Date/Time   COLORURINE YELLOW 02/28/2022 1414   APPEARANCEUR CLEAR 02/28/2022 1414   LABSPEC 1.014 02/28/2022 1414   PHURINE 5.0 02/28/2022 1414   GLUCOSEU >=500 (A) 02/28/2022 1414   HGBUR NEGATIVE 02/28/2022 1414   BILIRUBINUR NEGATIVE 02/28/2022 1414   KETONESUR NEGATIVE 02/28/2022 1414   PROTEINUR >=300 (A) 02/28/2022 1414   NITRITE NEGATIVE 02/28/2022 1414   LEUKOCYTESUR NEGATIVE 02/28/2022 1414   Sepsis Labs Recent Labs  Lab 05/03/22 1410 05/04/22 0314 05/05/22 0353  WBC 10.1 14.3* 10.9*    Microbiology Recent Results (from the past 240 hour(s))  Culture, blood (routine x 2)     Status: None (Preliminary result)   Collection Time: 05/03/22  2:37 PM   Specimen: BLOOD  Result Value Ref Range Status   Specimen Description   Final    BLOOD BLOOD RIGHT HAND Performed at [facility], [address] [place], [place] [address]    Special Requests   Final    BOTTLES DRAWN AEROBIC AND ANAEROBIC Blood Culture adequate volume Performed at [facility], [address], [place], [place] [address]    Culture  Setup Time   Final    [place] M. Rossen RN at 1007 05/04/22  R.Byrd AEROBIC BOTTLE ONLY Organism ID to follow Performed at [facility], [place] [address] [place], [place] [address]    Culture GRAM NEGATIVE RODS  Final   Report Status PENDING  Incomplete  Blood Culture ID Panel (Reflexed)     Status: Abnormal   Collection Time: 05/03/22  2:37 PM  Result Value Ref Range Status   Enterococcus faecalis NOT DETECTED NOT DETECTED Final   Enterococcus Faecium NOT DETECTED NOT DETECTED Final   Listeria monocytogenes NOT DETECTED NOT DETECTED Final   Staphylococcus species NOT DETECTED NOT DETECTED Final   Staphylococcus aureus (BCID) NOT DETECTED NOT DETECTED Final   Staphylococcus epidermidis NOT DETECTED NOT DETECTED Final   Staphylococcus lugdunensis NOT DETECTED NOT DETECTED Final   Streptococcus species NOT DETECTED NOT DETECTED Final   Streptococcus agalactiae NOT DETECTED NOT DETECTED Final   Streptococcus pneumoniae NOT DETECTED NOT DETECTED Final   Streptococcus pyogenes NOT DETECTED NOT DETECTED Final  A.calcoaceticus-baumannii NOT DETECTED NOT DETECTED Final   Bacteroides fragilis NOT DETECTED NOT DETECTED Final   Enterobacterales DETECTED (A) NOT DETECTED Final    Comment: Enterobacterales represent a large order of gram negative bacteria, not a single organism. CRITICAL RESULT CALLED TO, READ BACK BY AND VERIFIED WITH: PHARMD LORI P. [phone]  FCP    Enterobacter cloacae complex DETECTED (A) NOT DETECTED Final    Comment: CRITICAL RESULT CALLED TO, READ BACK BY AND VERIFIED WITH: PHARMD LORI P. [phone] FCP    Escherichia coli NOT DETECTED NOT DETECTED Final   Klebsiella aerogenes NOT DETECTED NOT DETECTED Final   Klebsiella oxytoca NOT DETECTED NOT DETECTED Final   Klebsiella pneumoniae NOT DETECTED NOT DETECTED Final   Proteus species NOT DETECTED NOT DETECTED Final   Salmonella species NOT DETECTED NOT DETECTED Final   Serratia marcescens NOT DETECTED NOT DETECTED Final   Haemophilus influenzae NOT DETECTED NOT DETECTED Final   Neisseria meningitidis NOT DETECTED NOT DETECTED Final   Pseudomonas aeruginosa NOT DETECTED NOT DETECTED Final   Stenotrophomonas maltophilia NOT DETECTED NOT DETECTED Final   Candida albicans NOT DETECTED NOT DETECTED Final   Candida auris NOT DETECTED NOT DETECTED Final   Candida glabrata NOT DETECTED NOT DETECTED Final   Candida krusei NOT DETECTED NOT DETECTED Final   Candida parapsilosis NOT DETECTED NOT DETECTED Final   Candida tropicalis NOT DETECTED NOT DETECTED Final   Cryptococcus neoformans/gattii NOT DETECTED NOT DETECTED Final   CTX-M ESBL NOT DETECTED NOT DETECTED Final   Carbapenem resistance IMP NOT DETECTED NOT DETECTED Final   Carbapenem resistance KPC NOT DETECTED NOT DETECTED Final   Carbapenem resistance NDM NOT DETECTED NOT DETECTED Final   Carbapenem resist OXA 48 LIKE NOT DETECTED NOT DETECTED Final   Carbapenem resistance VIM NOT DETECTED NOT DETECTED Final    Comment: Performed at [facility], [address], [place], [place] [address]  Culture, blood (routine x 2)     Status: None (Preliminary result)   Collection Time: 05/03/22  2:42 PM   Specimen: BLOOD LEFT HAND  Result Value Ref Range Status   Specimen Description   Final    BLOOD LEFT HAND Performed at [facility], [place] [address], [place], [place] [address]    Special Requests   Final     Blood Culture results may not be optimal due to an excessive volume of blood received in culture bottles [place] Performed at [facility], [address], [place], [place] [address]    Culture  Setup Time   Final    [place] M. Rossen at 1328  05/04/22 R.BYRD AEROBIC BOTTLE ONLY    Culture   Final    GRAM NEGATIVE RODS IDENTIFICATION AND SUSCEPTIBILITIES TO FOLLOW Performed at [facility], [place] [address], [place], [place] [address]    Report Status PENDING  Incomplete  Resp Panel by RT-PCR (Flu A&B, Covid) Anterior Nasal Swab     Status: None   Collection Time: 05/03/22  3:09 PM   Specimen: Anterior Nasal Swab  Result Value Ref Range Status   SARS Coronavirus 2 by RT PCR NEGATIVE NEGATIVE Final    Comment: (NOTE) SARS-CoV-2 target nucleic acids are NOT DETECTED.  The SARS-CoV-2 RNA is generally detectable in upper respiratory specimens during the acute phase of infection. The lowest concentration of SARS-CoV-2 viral copies this assay can detect is 138 copies/mL. A negative result does not preclude SARS-Cov-2 infection  and should not be used as the sole basis for treatment or other patient management decisions. A negative result may occur with  improper specimen collection/handling, submission of specimen other than nasopharyngeal swab, presence of viral mutation(s) within the areas targeted by this assay, and inadequate number of viral copies(<138 copies/mL). A negative result must be combined with clinical observations, patient history, and epidemiological information. The expected result is Negative.  Fact Sheet for Patients:  EntrepreneurPulse.com.au  Fact Sheet for Healthcare Providers:  IncredibleEmployment.be  This test is no t yet approved or cleared by the [place] FDA and  has been authorized for detection and/or diagnosis of SARS-CoV-2 by FDA under an Emergency Use  Authorization (EUA). This EUA will remain  in effect (meaning this test can be used) for the duration of the COVID-19 declaration under Section 564(b)(1) of the Act, 21 U.S.C.section 360bbb-3(b)(1), unless the authorization is terminated  or revoked sooner.       Influenza A by PCR NEGATIVE NEGATIVE Final   Influenza B by PCR NEGATIVE NEGATIVE Final    Comment: (NOTE) The Xpert Xpress SARS-CoV-2/FLU/RSV plus assay is intended as an aid in the diagnosis of influenza from Nasopharyngeal swab specimens and should not be used as a sole basis for treatment. Nasal washings and aspirates are unacceptable for Xpert Xpress SARS-CoV-2/FLU/RSV testing.  Fact Sheet for Patients: EntrepreneurPulse.com.au  Fact Sheet for Healthcare Providers: IncredibleEmployment.be  This test is not yet approved or cleared by the [place] FDA and has been authorized for detection and/or diagnosis of SARS-CoV-2 by FDA under an Emergency Use Authorization (EUA). This EUA will remain in effect (meaning this test can be used) for the duration of the COVID-19 declaration under Section 564(b)(1) of the Act, 21 U.S.C. section 360bbb-3(b)(1), unless the authorization is terminated or revoked.  Performed at [facility], [address], [place], [place] [address]   Culture, blood (single) w Reflex to ID Panel     Status: None (Preliminary result)   Collection Time: 05/03/22  8:02 PM   Specimen: BLOOD RIGHT HAND  Result Value Ref Range Status   Specimen Description BLOOD RIGHT HAND  Final   Special Requests   Final    BOTTLES DRAWN AEROBIC AND ANAEROBIC Blood Culture adequate volume   Culture   Final    NO GROWTH 2 DAYS Performed at [facility], [address] [place], [place] [address]    Report Status PENDING  Incomplete   Time coordinating discharge: 33 mins   SIGNED:  Irwin Brakeman, MD  [facility] 05/05/2022, 10:17 AM How to contact the [facility]  Attending or Consulting provider [place] or covering provider during after hours [place], for this patient?  Check the care team in [facility] and look for a) attending/consulting TRH provider listed and b) the [facility] team listed Log into [email or web address] and use [place]'s universal password to access. If you do not have the password, please contact the hospital operator. Locate the [facility] provider you are looking for under [facility] and page to a number that you can be directly reached. If you still have difficulty reaching the provider, please page the [facility] (Director on Call) for the Hospitalists listed on amion for assistance.

## 2022-05-05 NOTE — TOC Transition Note (Signed)
Transition of Care [name]) - CM/SW Discharge Note   Patient Details  Name: [name] MRN: [id number] Date of Birth: [birth year]  [name] CM/SW Contact:  Iona Beard, [place] Phone Number: 05/05/2022, 10:50 AM   Clinical Narrative:    CSW updated of plan for D/C. CSW updated Tommi Rumps with Alvis Lemmings of plan for D/C. MD placed [name] PT orders. TOC signing off.   Final next level of care: [place] Barriers to Discharge: Barriers Resolved   Patient Goals and CMS Choice Patient states their goals for this hospitalization and ongoing recovery are:: return home CMS Medicare.gov Compare Post Acute Care list provided to:: Patient Choice offered to / list presented to : Patient  Discharge Placement                       Discharge Plan and Services In-house Referral: Clinical Social Work   Post Acute Care Choice: Home Health                    HH Arranged: PT [facility] Agency: [place] Date [place]: 05/05/22 Time [place]: 0908 Representative spoke with at [place]: [place] ([place]) Interventions     Readmission Risk Interventions    05/04/2022    9:06 AM 03/01/2022    8:14 AM 08/30/2021    2:40 PM  Readmission Risk Prevention Plan  Transportation Screening Complete Complete Complete  Medication Review Press photographer) Complete Complete Complete  PCP or Specialist appointment within 3-5 days of discharge   Not Complete  HRI or [place] Complete Complete Complete  SW Recovery Care/Counseling Consult Complete Complete Complete  Palliative Care Screening Not Applicable Not Applicable Not [place] Not Applicable Not Applicable Not Applicable

## 2022-05-05 NOTE — Progress Notes (Signed)
Subjective:  were able to remove 5 liters with HD yesterday -  BP did not drop- post weight 82.3-  but is 84.3 this AM-  he is sitting up -  just ate breakfast-  94% on room air.  Was told that 1/4 blood cultures was positive for enterobacter-  given cefepime yesterday    Objective Vital signs in last 24 hours: Vitals:   05/04/22 1820 05/04/22 2111 05/05/22 0305 05/05/22 0500  BP: (!) 153/71 136/63 (!) 165/82   Pulse: 78 87 80   Resp: '18 20 19   '$ Temp: (!) 97.5 F (36.4 C) (!) 97.3 F (36.3 C) 98.2 F (36.8 C)   TempSrc: Oral     SpO2: 92% 93% 97%   Weight: 82.3 kg   84.3 kg  Height:       Weight change: 1.017 kg  Intake/Output Summary (Last 24 hours) at 05/05/2022 0855 Last data filed at 05/05/2022 0600 Gross per 24 hour  Intake 1160 ml  Output 5275 ml  Net -4115 ml    Dialyzes at [facility] MWF 4 hours  EDW 83- does not miss-  has not been getting to EDW as OP, leaving out about 3 kg over-  coming in with too much to pull. HD Bath 2/2.5, Dialyzer 180, Heparin yes. Access [facility]-  resting AVF after infiltration.mircera 150 q 2 weeks, iron 100 weekly- last hgb 9.8, calcitriol 0.25   Assessment/Plan: 66 year old WM with many medical problems to include ESRD-  presents with SOB and hypoxia-  indications of pulmonary edema  1 SOB-  all indicators are of volume overload/pulm edema-  has been coming and staying for treatments but not to EDW as goals are too high.  Feels much better with 5 liters UF with HD yesterday -  I actually think edw is lower than where it is set  2 ESRD: normally MWF as OP-  Sun/Tues/Fri holiday schedule this week.  Had extra treatment 11/22 focusing on volume removal really only, hoping that might fix the hypoxia issue so that he could go home -  next would be due Friday-  hopefully at the OP center  3 Hypertension: BP controlled-  holding all BP meds for now-  may need to stop some as OP to improve the UF at HD 4. Anemia of ESRD: is close to where has been as  OP and just under 10-  no dosing of ESA here 5. 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 AVF patent but resting after infiltration  7. 1/4 blood cultures positive for enterobacter-  not that symptomatic but WBC 14-  hate to presume contaminent with a gram negative organism.  He clinically looks good.  I think I feel comfortable continuing to treat with cefepime at OP kidney center-  presumed source maybe the [facility]-  will instruct them to use AVF in order to get [facility] out asap   [place]: Basic Metabolic Panel: Recent Labs  Lab 05/03/22 1410 05/04/22 0314 05/05/22 0353  NA 138 138 130*  K 3.2* 3.8 4.2  CL 98 100 96*  CO2 27 27 21*  GLUCOSE 116* 158* 214*  BUN 21 27* 47*  CREATININE 2.88* 3.94* 5.49*  CALCIUM 8.7* 8.3* 8.2*  PHOS  --  4.6 4.6   Liver Function Tests: Recent Labs  Lab 05/03/22 1410 05/04/22 0314 05/05/22 0353  AST 16  --   --   ALT 15  --   --  ALKPHOS 131*  --   --   BILITOT 1.4*  --   --   PROT 6.7  --   --   ALBUMIN 3.5 3.1* 3.3*   No results for input(s): "LIPASE", "AMYLASE" in the last 168 hours. No results for input(s): "AMMONIA" in the last 168 hours. CBC: Recent Labs  Lab 05/03/22 1410 05/04/22 0314 05/05/22 0353  WBC 10.1 14.3* 10.9*  NEUTROABS 9.5*  --  8.6*  HGB 9.9* 9.9* 10.3*  HCT 31.8* 32.5* 34.0*  MCV 86.6 88.8 87.6  PLT 206 205 227   Cardiac Enzymes: No results for input(s): "CKTOTAL", "CKMB", "CKMBINDEX", "TROPONINI" in the last 168 hours. CBG: Recent Labs  Lab 05/04/22 1816 05/04/22 2130 05/05/22 0302 05/05/22 0757  GLUCAP 249* 346* 247* 209*    Iron Studies: No results for input(s): "IRON", "TIBC", "TRANSFERRIN", "FERRITIN" in the last 72 hours. Studies/Results: CT Angio Chest PE W and/or Wo Contrast  Result Date: 05/03/2022 CLINICAL DATA:  Pulmonary embolism (PE) suspected, low to intermediate prob, positive D-dimer Pt arrived via [facility] c/o 3 hrs and 5 mins of  dialysis treatment when he had a sudden onset of Nausea and hypertension. EXAM: CT ANGIOGRAPHY CHEST WITH CONTRAST TECHNIQUE: Multidetector CT imaging of the chest was performed using the standard protocol during bolus administration of intravenous contrast. Multiplanar CT image reconstructions and MIPs were obtained to evaluate the vascular anatomy. RADIATION DOSE REDUCTION: This exam was performed according to the departmental dose-optimization program which includes automated exposure control, adjustment of the mA and/or kV according to patient size and/or use of iterative reconstruction technique. CONTRAST:  55m OMNIPAQUE IOHEXOL 350 MG/ML SOLN COMPARISON:  Chest XR, concurrent.  CTA chest, 02/28/2022. FINDINGS: Suboptimal evaluation, secondary to motion degradation. Cardiovascular: Satisfactory opacification of the pulmonary arteries to the segmental level. No segmental or larger pulmonary embolus. Dilated main PA, measuring up to 4.2 cm. Multi chamber cardiac enlargement. No pericardial effusion. 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 RIGHT jugular approach dialysis catheter, well-positioned with tip within the proximal RIGHT atrium. Mediastinum/Nodes: No enlarged mediastinal, hilar, or axillary lymph nodes. Thyroid gland, trachea, and esophagus demonstrate no significant findings. Lungs/Pleura: Small volume RIGHT pleural effusion with adjacent dependent atelectasis. Mild bilateral pulmonary interstitial thickening with trace ground-glass opacities. No suspicious pulmonary nodule or mass. No pneumothorax. Upper Abdomen: No acute abnormality. Musculoskeletal: Postsurgical changes of median sternotomy. No acute chest wall abnormality. No significant osseous findings. Review of the MIP images confirms the above findings. IMPRESSION: Suboptimal evaluation, within these constraints; 1.  No segmental or larger pulmonary embolus. 2. Main pulmonary artery dilation,  likely reflecting underlying pulmonary arterial hypertension. 3. Cardiomegaly, small volume RIGHT pleural effusion and interstitial thickening. Findings suspicious for early/developing pulmonary edema. Additional incidental, chronic and senescent findings as above. Electronically Signed   By: JMichaelle BirksM.D.   On: 05/03/2022 17:06   DG Chest Port 1 View  Result Date: 05/03/2022 CLINICAL DATA:  Shortness of breath, onset of nausea and hypertension during dialysis treatment EXAM: PORTABLE CHEST 1 VIEW COMPARISON:  Portable exam 1420 hours compared to 03/03/2022 FINDINGS: RIGHT jugular central venous catheter with tip projecting over RIGHT atrium. Enlargement of cardiac silhouette with slight vascular congestion post CABG. RIGHT pleural effusion and mild RIGHT basilar atelectasis seen. Accentuation of interstitial markings similar to prior exam. Linear atelectasis RIGHT upper lobe unchanged. No pneumothorax or acute osseous findings. IMPRESSION: Enlargement of cardiac silhouette with vascular congestion. Small RIGHT pleural effusion and basilar atelectasis with persistent accentuation of  interstitial markings and linear atelectasis in RIGHT upper lobe. Electronically Signed   By: Lavonia Dana M.D.   On: 05/03/2022 14:34   Medications: Infusions:  ceFEPime (MAXIPIME) IV 2 g (05/04/22 1740)    Scheduled Medications:  amiodarone  200 mg Oral Daily   ARIPiprazole  5 mg Oral Daily   ascorbic acid  500 mg Oral Daily   aspirin EC  325 mg Oral Daily   calcium acetate  667 mg Oral TID WC   calcium acetate  667 mg Oral TID WC   calcium-vitamin D  1 tablet Oral BID   Chlorhexidine Gluconate Cloth  6 each Topical Daily   Chlorhexidine Gluconate Cloth  6 each Topical Q0600   doxazosin  2 mg Oral Daily   heparin  5,000 Units Subcutaneous Q8H   hydrALAZINE  50 mg Oral Q8H   insulin aspart  0-6 Units Subcutaneous TID WC   insulin aspart  3 Units Subcutaneous TID WC   isosorbide dinitrate  30 mg Oral TID    metoprolol succinate  50 mg Oral Daily   pantoprazole  40 mg Oral BID   sertraline  100 mg Oral Daily   sodium chloride flush  3 mL Intravenous Q12H    have reviewed scheduled and prn medications.  Physical Exam: General: sitting up--  NAD-  asking me about me-  is concerned that I am here on a holiday Heart: RRR Lungs: mostly clear Abdomen: soft, non tender Extremities: better edema Dialysis Access: left upper AVF- patent and less bruising-  TDC non tender    05/05/2022,8:55 AM  LOS: 2 days

## 2022-05-06 ENCOUNTER — Other Ambulatory Visit: Payer: Self-pay | Admitting: Family Medicine

## 2022-05-06 ENCOUNTER — Telehealth: Payer: Self-pay

## 2022-05-06 DIAGNOSIS — N186 End stage renal disease: Secondary | ICD-10-CM | POA: Diagnosis not present

## 2022-05-06 DIAGNOSIS — R7881 Bacteremia: Secondary | ICD-10-CM

## 2022-05-06 DIAGNOSIS — N2581 Secondary hyperparathyroidism of renal origin: Secondary | ICD-10-CM | POA: Diagnosis not present

## 2022-05-06 DIAGNOSIS — Z992 Dependence on renal dialysis: Secondary | ICD-10-CM | POA: Diagnosis not present

## 2022-05-06 LAB — CULTURE, BLOOD (ROUTINE X 2)

## 2022-05-06 MED ORDER — SODIUM CHLORIDE 0.9 % IV SOLN: 2.0000 g | INTRAVENOUS | 0 refills | Status: DC

## 2022-05-06 MED ORDER — SODIUM CHLORIDE 0.9 % IV SOLN: 2.0000 g | INTRAVENOUS | 0 refills | Status: AC

## 2022-05-06 NOTE — Progress Notes (Signed)
HD center requesting written Rx for cefepime.  Multiple attempts to print Rx but would not print.

## 2022-05-06 NOTE — Telephone Encounter (Signed)
Transition Care Management Follow-up Telephone Call Date of discharge and from where: 05/05/22 [name]  How have you been since you were released from the hospital? Stable  Any questions or concerns? Yes; patient with questions about diabetic testing supplies   Items Reviewed: Did the pt receive and understand the discharge instructions provided? Yes  Medications obtained and verified? Yes  Other?  N/a  Any new allergies since your discharge? No  Dietary orders reviewed? Yes Do you have support at home? Yes   Home Care and Equipment/Supplies: Were home health services ordered? yes If so, what is the name of the agency?    Has the agency set up a time to come to the patient's home? yes Were any new equipment or medical supplies ordered?  No What is the name of the medical supply agency? N/a Were you able to get the supplies/equipment? not applicable Do you have any questions related to the use of the equipment or supplies? No  Functional Questionnaire: (I = Independent and D = Dependent) ADLs: I  Bathing/Dressing- I  Meal Prep- I  Eating- I  Maintaining continence- I  Transferring/Ambulation- I  Managing Meds- I  Follow up appointments reviewed:  PCP Hospital f/u appt confirmed? Yes  Scheduled to see Dr. Wolfgang Phoenix on 05/12/22 @ 11:20. [facility] f/u appt confirmed?  Continue with dialysis schedule    Are transportation arrangements needed? No  If their condition worsens, is the pt aware to call PCP or go to the Emergency Dept.? Yes Was the patient provided with contact information for the PCP's office or ED? Yes Was to pt encouraged to call back with questions or concerns? Yes

## 2022-05-07 ENCOUNTER — Telehealth: Payer: Self-pay | Admitting: Physician Assistant

## 2022-05-07 NOTE — Telephone Encounter (Signed)
Transition of care contact from inpatient facility  Date of Discharge: 05/05/22 Date of Contact: 05/07/22 Method of contact: Phone  Attempted to contact patient to discuss transition of care from inpatient admission. Patient did not answer the phone. Message was left on the patient's voicemail with call back instructions.  Anice Paganini, PA-C 05/07/2022, 10:33 AM  [employer]

## 2022-05-08 LAB — CULTURE, BLOOD (SINGLE)
Culture: NO GROWTH
Special Requests: ADEQUATE

## 2022-05-09 DIAGNOSIS — Z992 Dependence on renal dialysis: Secondary | ICD-10-CM | POA: Diagnosis not present

## 2022-05-09 DIAGNOSIS — N186 End stage renal disease: Secondary | ICD-10-CM | POA: Diagnosis not present

## 2022-05-09 DIAGNOSIS — N2581 Secondary hyperparathyroidism of renal origin: Secondary | ICD-10-CM | POA: Diagnosis not present

## 2022-05-09 LAB — GLUCOSE, CAPILLARY
Glucose-Capillary: 149 mg/dL — ABNORMAL HIGH (ref 70–99)
Glucose-Capillary: 250 mg/dL — ABNORMAL HIGH (ref 70–99)

## 2022-05-09 IMAGING — DX DG CHEST 2V
2 series · 2 of 2 positions shown · non-contrast
Comparison: Chest x-ray dated July 24, 2021.

CLINICAL DATA: Shortness of breath.

EXAM:
CHEST - 2 VIEW

[chest pa]
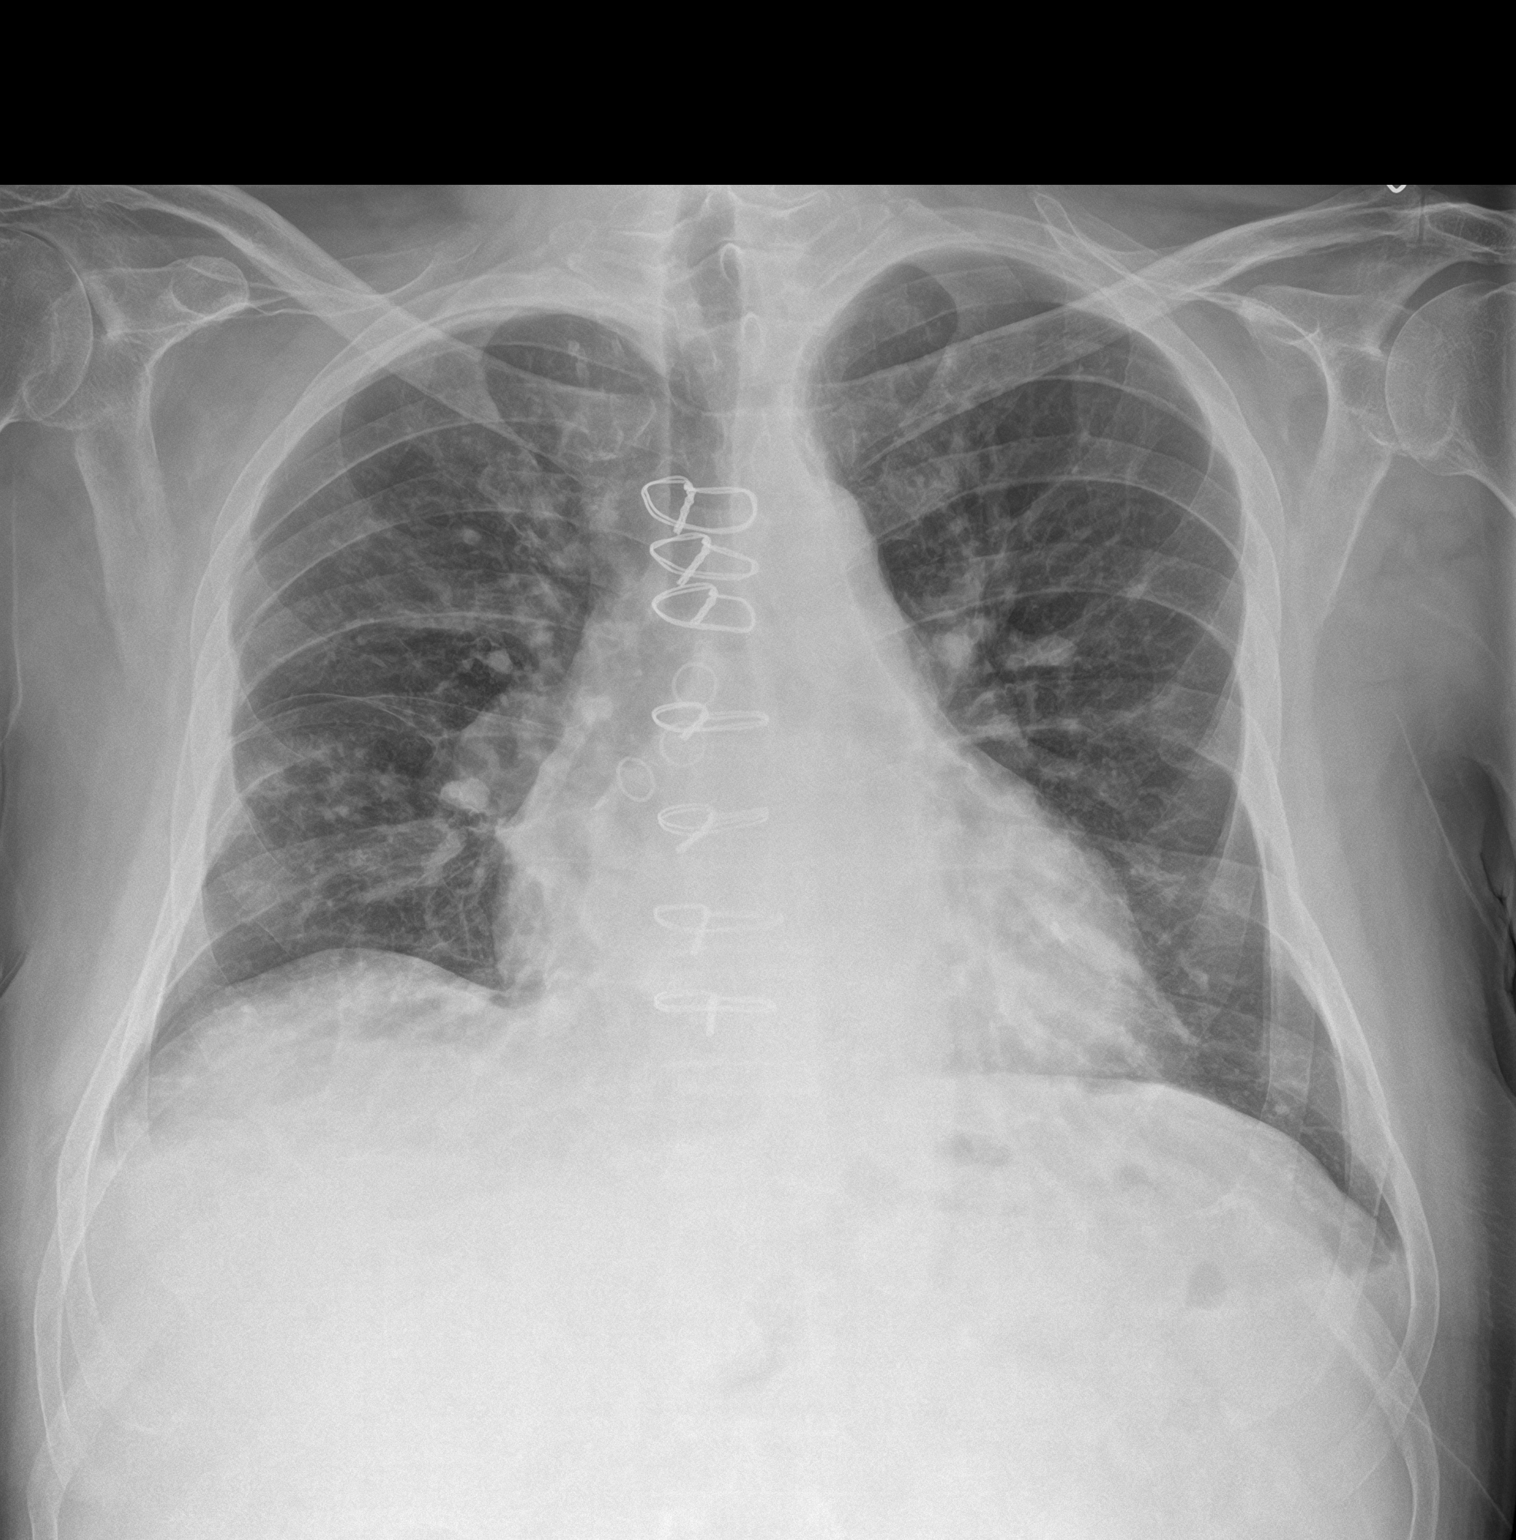

[chest lat]
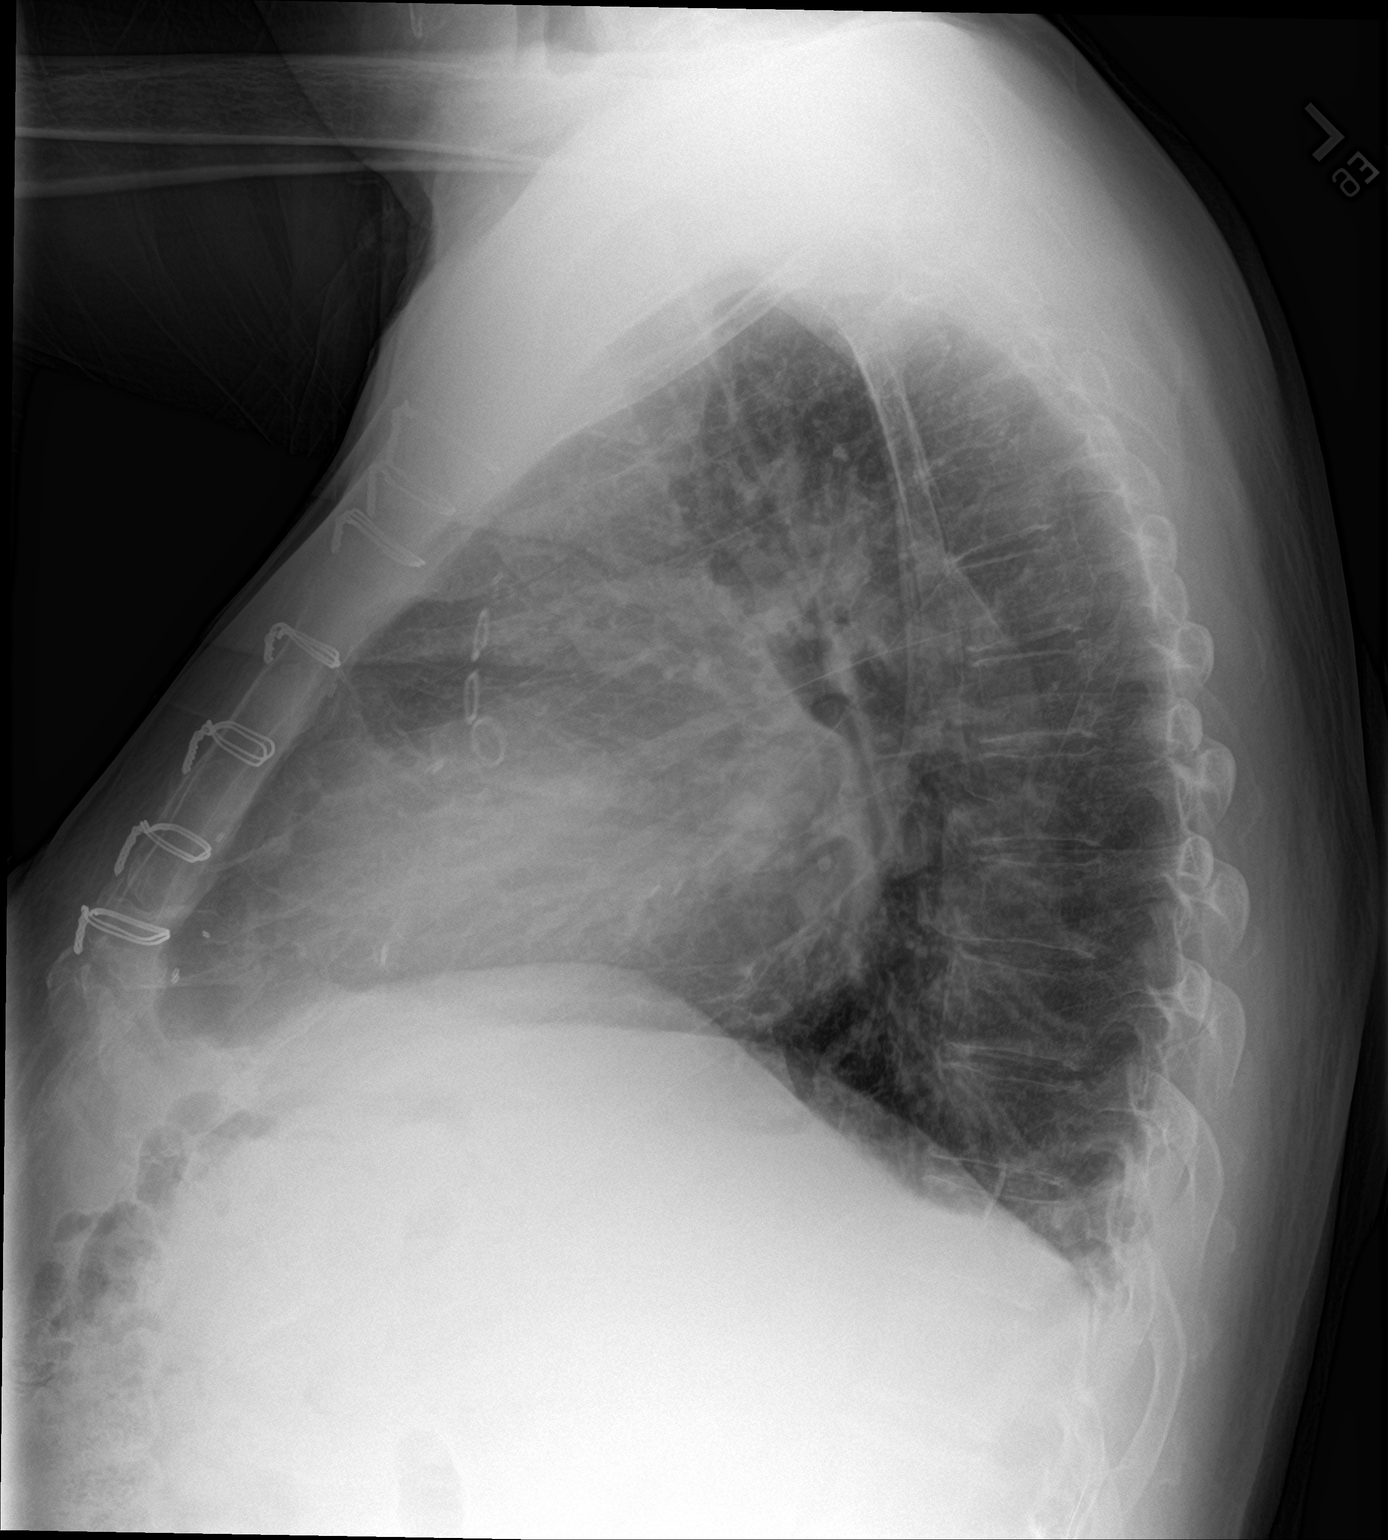

[2 of 2 positions shown; findings below may reference images not displayed]

FINDINGS: Unchanged mild cardiomegaly status post CABG. Chronic mild pulmonary
vascular congestion. New mild patchy opacity in the right lower
lobe. Unchanged trace bilateral pleural effusions. No pneumothorax.
No acute osseous abnormality.
IMPRESSION: 1. New mild patchy opacity in the right lower lobe which may reflect
atelectasis or early infiltrate.
2. Unchanged chronic mild pulmonary vascular congestion and trace
bilateral pleural effusions.

## 2022-05-10 ENCOUNTER — Telehealth: Payer: Self-pay | Admitting: *Deleted

## 2022-05-10 DIAGNOSIS — E1165 Type 2 diabetes mellitus with hyperglycemia: Secondary | ICD-10-CM | POA: Diagnosis not present

## 2022-05-10 DIAGNOSIS — I25810 Atherosclerosis of coronary artery bypass graft(s) without angina pectoris: Secondary | ICD-10-CM | POA: Diagnosis not present

## 2022-05-10 DIAGNOSIS — N19 Unspecified kidney failure: Secondary | ICD-10-CM | POA: Diagnosis not present

## 2022-05-10 DIAGNOSIS — E113593 Type 2 diabetes mellitus with proliferative diabetic retinopathy without macular edema, bilateral: Secondary | ICD-10-CM | POA: Diagnosis not present

## 2022-05-10 LAB — CULTURE, BLOOD (ROUTINE X 2): Special Requests: ADEQUATE

## 2022-05-10 NOTE — Progress Notes (Signed)
  Care Coordination Note  05/10/2022 Name: [name] MRN: [id number] DOB: 1955-07-23  [name] is a 66 y.o. year old male who is a primary care patient of [name], [name], MD and is actively engaged with the care management team. I reached out to [name] by phone today to assist with re-scheduling a follow up visit with the Licensed Clinical Social Worker  Follow up plan: Unsuccessful telephone outreach attempt made. A HIPAA compliant phone message was left for the patient providing contact information and requesting a return call.   [place]  Direct Dial: [phone]

## 2022-05-11 DIAGNOSIS — Z992 Dependence on renal dialysis: Secondary | ICD-10-CM | POA: Diagnosis not present

## 2022-05-11 DIAGNOSIS — N186 End stage renal disease: Secondary | ICD-10-CM | POA: Diagnosis not present

## 2022-05-11 DIAGNOSIS — N2581 Secondary hyperparathyroidism of renal origin: Secondary | ICD-10-CM | POA: Diagnosis not present

## 2022-05-12 ENCOUNTER — Ambulatory Visit (INDEPENDENT_AMBULATORY_CARE_PROVIDER_SITE_OTHER): Payer: Medicare HMO | Admitting: Family Medicine

## 2022-05-12 ENCOUNTER — Telehealth: Payer: Self-pay | Admitting: Family Medicine

## 2022-05-12 VITALS — BP 136/68 | HR 72 | Temp 97.7°F | Ht 70.0 in | Wt 192.0 lb

## 2022-05-12 DIAGNOSIS — E1122 Type 2 diabetes mellitus with diabetic chronic kidney disease: Secondary | ICD-10-CM | POA: Diagnosis not present

## 2022-05-12 DIAGNOSIS — I5043 Acute on chronic combined systolic (congestive) and diastolic (congestive) heart failure: Secondary | ICD-10-CM

## 2022-05-12 DIAGNOSIS — R270 Ataxia, unspecified: Secondary | ICD-10-CM

## 2022-05-12 DIAGNOSIS — E1159 Type 2 diabetes mellitus with other circulatory complications: Secondary | ICD-10-CM | POA: Diagnosis not present

## 2022-05-12 DIAGNOSIS — L853 Xerosis cutis: Secondary | ICD-10-CM | POA: Diagnosis not present

## 2022-05-12 DIAGNOSIS — N186 End stage renal disease: Secondary | ICD-10-CM | POA: Diagnosis not present

## 2022-05-12 DIAGNOSIS — I10 Essential (primary) hypertension: Secondary | ICD-10-CM | POA: Diagnosis not present

## 2022-05-12 DIAGNOSIS — C159 Malignant neoplasm of esophagus, unspecified: Secondary | ICD-10-CM | POA: Diagnosis not present

## 2022-05-12 DIAGNOSIS — Z992 Dependence on renal dialysis: Secondary | ICD-10-CM | POA: Diagnosis not present

## 2022-05-12 MED ORDER — ONDANSETRON 4 MG PO TBDP: ORAL_TABLET | ORAL | 1 refills | Status: DC

## 2022-05-12 MED ORDER — MUPIROCIN 2 % EX OINT: TOPICAL_OINTMENT | CUTANEOUS | 3 refills | Status: DC

## 2022-05-12 NOTE — Progress Notes (Signed)
05/12/22- script written out for lift chair and palliative care referral placed

## 2022-05-12 NOTE — Telephone Encounter (Signed)
Script written out for lift chair-left message for patient to return call. Need to know where he would like script to go.

## 2022-05-12 NOTE — Addendum Note (Signed)
Addended by: Vicente Males on: 05/12/2022 02:13 PM   Modules accepted: Orders

## 2022-05-12 NOTE — Progress Notes (Signed)
   Subjective:    Patient ID: [name], male    DOB: 07-16-1955, 66 y.o.   MRN: [id number]  [facility] follow-up Patient recently in the hospital with kidney failure, dialysis, congestive heart failure, infection of the port He is supposed to have the chest port taken out in the near future Getting IV antibiotics with every dialysis Denies any chest pressure tightness or shortness of breath currently Sugars are doing reasonable Patient has a lot of fatigue tiredness shortness of breath that comes along with his chronic health issues He does not have anyone helping him out His insurance [employer] apparently only cover certain places for home health and has made it difficult for him to get home health He would benefit from having someone come in and help him In my opinion he would benefit from palliative care We did discuss palliative care and he is open to consultation for palliative care   Review of Systems     Objective:   Physical Exam  General-in no acute distress Eyes-no discharge Lungs-respiratory rate normal, CTA CV-no murmurs,RRR Extremities skin warm dry no edema Neuro grossly normal Behavior normal, alert       Assessment & Plan:  1. Essential hypertension, benign Blood pressure decent control continue current measures  2. DM type 2 causing vascular disease (HCC) Keeping sugars under decent control following through with specialist  3. Acute on chronic combined systolic and diastolic CHF (congestive heart failure) (HCC) No sign of overt CHF currently.  Stable on current regimen  4. 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 Dry skin Recommend lotion of the legs Bactroban to the open wounds twice daily  Also patient is debilitated homebound, spends most of his time in a chair or wheelchair does not have the strength to walk any significant distance has chronic health issues including esophageal cancer, fragile  diabetes, CHF, end-stage renal disease-given all of this he would benefit from palliative care.  We will go ahead with consultation. He also needs a lift chair to help him with ADLs-he does have a friend of the family that helps him but she is not able to do any significant lifting He will do a follow-up in 3 months sooner if he needs [place]

## 2022-05-12 NOTE — Progress Notes (Signed)
   Subjective:    Patient ID: [name], male    DOB: 05/05/1956, 66 y.o.   MRN: [id number]  HPI Follow up for recent hospitalization for infection of port And respiratory infection on IV antibiotic given by dialysis Right great toe follow up    Review of Systems     Objective:   Physical Exam        Assessment & Plan:

## 2022-05-13 DIAGNOSIS — E1165 Type 2 diabetes mellitus with hyperglycemia: Secondary | ICD-10-CM | POA: Diagnosis not present

## 2022-05-13 DIAGNOSIS — N2581 Secondary hyperparathyroidism of renal origin: Secondary | ICD-10-CM | POA: Diagnosis not present

## 2022-05-13 DIAGNOSIS — N186 End stage renal disease: Secondary | ICD-10-CM | POA: Diagnosis not present

## 2022-05-13 DIAGNOSIS — Z992 Dependence on renal dialysis: Secondary | ICD-10-CM | POA: Diagnosis not present

## 2022-05-16 DIAGNOSIS — N186 End stage renal disease: Secondary | ICD-10-CM | POA: Diagnosis not present

## 2022-05-16 DIAGNOSIS — Z992 Dependence on renal dialysis: Secondary | ICD-10-CM | POA: Diagnosis not present

## 2022-05-16 DIAGNOSIS — N2581 Secondary hyperparathyroidism of renal origin: Secondary | ICD-10-CM | POA: Diagnosis not present

## 2022-05-16 NOTE — Progress Notes (Signed)
  Care Coordination Note  05/16/2022 Name: LUQMAN PERRELLI MRN: [id number] DOB: 07/14/1955  ROMANI WILBON is a 66 y.o. year old male who is a primary care patient of [name], [name], MD and is actively engaged with the care management team. I reached out to [name] by phone today to assist with re-scheduling a follow up visit with the Licensed Clinical Social Worker  Follow up plan: Telephone appointment with care management team member scheduled for:05/31/22  [place]  Direct Dial: [phone]

## 2022-05-17 ENCOUNTER — Telehealth: Payer: Self-pay

## 2022-05-17 NOTE — Telephone Encounter (Signed)
224 pm.  Phone call made to patient for new Palliative Care Referral.  No answer.  Message left requesting a callback.

## 2022-05-18 DIAGNOSIS — N2581 Secondary hyperparathyroidism of renal origin: Secondary | ICD-10-CM | POA: Diagnosis not present

## 2022-05-18 DIAGNOSIS — Z992 Dependence on renal dialysis: Secondary | ICD-10-CM | POA: Diagnosis not present

## 2022-05-18 DIAGNOSIS — N186 End stage renal disease: Secondary | ICD-10-CM | POA: Diagnosis not present

## 2022-05-19 ENCOUNTER — Ambulatory Visit (HOSPITAL_COMMUNITY)
Admission: RE | Admit: 2022-05-19 | Discharge: 2022-05-19 | Disposition: A | Discharge status: Home / Self Care | Payer: Medicare HMO | Source: Ambulatory Visit | Attending: Internal Medicine | Admitting: Internal Medicine

## 2022-05-19 ENCOUNTER — Other Ambulatory Visit (HOSPITAL_COMMUNITY): Payer: Self-pay | Admitting: Internal Medicine

## 2022-05-19 DIAGNOSIS — C169 Malignant neoplasm of stomach, unspecified: Secondary | ICD-10-CM | POA: Insufficient documentation

## 2022-05-19 DIAGNOSIS — J9811 Atelectasis: Secondary | ICD-10-CM | POA: Diagnosis not present

## 2022-05-19 DIAGNOSIS — N2889 Other specified disorders of kidney and ureter: Secondary | ICD-10-CM | POA: Diagnosis not present

## 2022-05-19 DIAGNOSIS — J90 Pleural effusion, not elsewhere classified: Secondary | ICD-10-CM | POA: Diagnosis not present

## 2022-05-20 ENCOUNTER — Telehealth: Payer: Self-pay

## 2022-05-20 DIAGNOSIS — N2581 Secondary hyperparathyroidism of renal origin: Secondary | ICD-10-CM | POA: Diagnosis not present

## 2022-05-20 DIAGNOSIS — N186 End stage renal disease: Secondary | ICD-10-CM | POA: Diagnosis not present

## 2022-05-20 DIAGNOSIS — Z992 Dependence on renal dialysis: Secondary | ICD-10-CM | POA: Diagnosis not present

## 2022-05-20 NOTE — Telephone Encounter (Signed)
1120 am.  2nd attempt to reach patient for Palliative Care referral unsuccessful.  Message left for patient requesting a call back.

## 2022-05-20 NOTE — Telephone Encounter (Signed)
Patient has been advised and he will call us back with that information after contacting his insurance.

## 2022-05-23 DIAGNOSIS — N186 End stage renal disease: Secondary | ICD-10-CM | POA: Diagnosis not present

## 2022-05-23 DIAGNOSIS — Z992 Dependence on renal dialysis: Secondary | ICD-10-CM | POA: Diagnosis not present

## 2022-05-23 DIAGNOSIS — N2581 Secondary hyperparathyroidism of renal origin: Secondary | ICD-10-CM | POA: Diagnosis not present

## 2022-05-24 ENCOUNTER — Telehealth: Payer: Self-pay

## 2022-05-24 DIAGNOSIS — Z992 Dependence on renal dialysis: Secondary | ICD-10-CM | POA: Diagnosis not present

## 2022-05-24 DIAGNOSIS — I200 Unstable angina: Secondary | ICD-10-CM

## 2022-05-24 DIAGNOSIS — T8249XA Other complication of vascular dialysis catheter, initial encounter: Secondary | ICD-10-CM | POA: Diagnosis not present

## 2022-05-24 DIAGNOSIS — N186 End stage renal disease: Secondary | ICD-10-CM | POA: Diagnosis not present

## 2022-05-24 NOTE — Telephone Encounter (Signed)
345 pm.  Follow up call made to patient regarding palliative care referral.  Explained services and coverage area to patient.  He would prefer home visits rather than virtual.  Information provided on [facility].  Patient requested additional information sent to his email and he will contact [facility] if he is interested in services. Link sent at his request.  Will close referral as patient prefers in home visits.

## 2022-05-25 DIAGNOSIS — C159 Malignant neoplasm of esophagus, unspecified: Secondary | ICD-10-CM | POA: Diagnosis not present

## 2022-05-25 DIAGNOSIS — N2581 Secondary hyperparathyroidism of renal origin: Secondary | ICD-10-CM | POA: Diagnosis not present

## 2022-05-25 DIAGNOSIS — N186 End stage renal disease: Secondary | ICD-10-CM | POA: Diagnosis not present

## 2022-05-25 DIAGNOSIS — Z992 Dependence on renal dialysis: Secondary | ICD-10-CM | POA: Diagnosis not present

## 2022-05-26 ENCOUNTER — Ambulatory Visit (HOSPITAL_BASED_OUTPATIENT_CLINIC_OR_DEPARTMENT_OTHER): Payer: Medicare HMO | Admitting: Family

## 2022-05-26 DIAGNOSIS — E1121 Type 2 diabetes mellitus with diabetic nephropathy: Secondary | ICD-10-CM | POA: Diagnosis not present

## 2022-05-26 DIAGNOSIS — Z992 Dependence on renal dialysis: Secondary | ICD-10-CM | POA: Diagnosis not present

## 2022-05-26 DIAGNOSIS — Z538 Procedure and treatment not carried out for other reasons: Secondary | ICD-10-CM | POA: Diagnosis not present

## 2022-05-26 DIAGNOSIS — R000 Tachycardia, unspecified: Secondary | ICD-10-CM | POA: Diagnosis not present

## 2022-05-26 DIAGNOSIS — I132 Hypertensive heart and chronic kidney disease with heart failure and with stage 5 chronic kidney disease, or end stage renal disease: Secondary | ICD-10-CM | POA: Diagnosis not present

## 2022-05-26 DIAGNOSIS — I2510 Atherosclerotic heart disease of native coronary artery without angina pectoris: Secondary | ICD-10-CM | POA: Diagnosis not present

## 2022-05-26 DIAGNOSIS — I509 Heart failure, unspecified: Secondary | ICD-10-CM | POA: Diagnosis not present

## 2022-05-26 DIAGNOSIS — I4940 Unspecified premature depolarization: Secondary | ICD-10-CM | POA: Diagnosis not present

## 2022-05-26 DIAGNOSIS — I959 Hypotension, unspecified: Secondary | ICD-10-CM | POA: Diagnosis not present

## 2022-05-26 DIAGNOSIS — C159 Malignant neoplasm of esophagus, unspecified: Secondary | ICD-10-CM | POA: Diagnosis not present

## 2022-05-26 DIAGNOSIS — E1122 Type 2 diabetes mellitus with diabetic chronic kidney disease: Secondary | ICD-10-CM | POA: Diagnosis not present

## 2022-05-26 DIAGNOSIS — R9431 Abnormal electrocardiogram [ECG] [EKG]: Secondary | ICD-10-CM | POA: Diagnosis not present

## 2022-05-26 DIAGNOSIS — N186 End stage renal disease: Secondary | ICD-10-CM | POA: Diagnosis not present

## 2022-05-27 ENCOUNTER — Inpatient Hospital Stay (HOSPITAL_COMMUNITY)
Admission: EM | Admit: 2022-05-27 | Discharge: 2022-05-31 | DRG: 377 | Disposition: A | Discharge status: Home / Self Care | Payer: Medicare HMO | Attending: Internal Medicine | Admitting: Internal Medicine

## 2022-05-27 ENCOUNTER — Encounter (HOSPITAL_COMMUNITY)
Admission: EM | Disposition: A | Discharge status: Home / Self Care | Payer: Self-pay | Source: Home / Self Care | Attending: Internal Medicine

## 2022-05-27 ENCOUNTER — Other Ambulatory Visit: Payer: Self-pay

## 2022-05-27 ENCOUNTER — Encounter (HOSPITAL_COMMUNITY): Payer: Self-pay

## 2022-05-27 ENCOUNTER — Observation Stay (HOSPITAL_COMMUNITY): Payer: Medicare HMO | Admitting: Anesthesiology

## 2022-05-27 DIAGNOSIS — N2581 Secondary hyperparathyroidism of renal origin: Secondary | ICD-10-CM | POA: Diagnosis not present

## 2022-05-27 DIAGNOSIS — G4733 Obstructive sleep apnea (adult) (pediatric): Secondary | ICD-10-CM | POA: Diagnosis present

## 2022-05-27 DIAGNOSIS — K920 Hematemesis: Secondary | ICD-10-CM | POA: Diagnosis present

## 2022-05-27 DIAGNOSIS — I2510 Atherosclerotic heart disease of native coronary artery without angina pectoris: Secondary | ICD-10-CM | POA: Diagnosis present

## 2022-05-27 DIAGNOSIS — R000 Tachycardia, unspecified: Secondary | ICD-10-CM | POA: Diagnosis not present

## 2022-05-27 DIAGNOSIS — R1111 Vomiting without nausea: Secondary | ICD-10-CM | POA: Diagnosis not present

## 2022-05-27 DIAGNOSIS — Z794 Long term (current) use of insulin: Secondary | ICD-10-CM | POA: Diagnosis not present

## 2022-05-27 DIAGNOSIS — N186 End stage renal disease: Secondary | ICD-10-CM | POA: Diagnosis present

## 2022-05-27 DIAGNOSIS — I10 Essential (primary) hypertension: Secondary | ICD-10-CM | POA: Diagnosis not present

## 2022-05-27 DIAGNOSIS — Z87891 Personal history of nicotine dependence: Secondary | ICD-10-CM

## 2022-05-27 DIAGNOSIS — D62 Acute posthemorrhagic anemia: Secondary | ICD-10-CM | POA: Diagnosis present

## 2022-05-27 DIAGNOSIS — F32A Depression, unspecified: Secondary | ICD-10-CM | POA: Diagnosis present

## 2022-05-27 DIAGNOSIS — K319 Disease of stomach and duodenum, unspecified: Secondary | ICD-10-CM | POA: Diagnosis present

## 2022-05-27 DIAGNOSIS — R6889 Other general symptoms and signs: Secondary | ICD-10-CM | POA: Diagnosis not present

## 2022-05-27 DIAGNOSIS — Z992 Dependence on renal dialysis: Secondary | ICD-10-CM | POA: Diagnosis not present

## 2022-05-27 DIAGNOSIS — K449 Diaphragmatic hernia without obstruction or gangrene: Secondary | ICD-10-CM | POA: Diagnosis present

## 2022-05-27 DIAGNOSIS — I5042 Chronic combined systolic (congestive) and diastolic (congestive) heart failure: Secondary | ICD-10-CM | POA: Diagnosis present

## 2022-05-27 DIAGNOSIS — Z79899 Other long term (current) drug therapy: Secondary | ICD-10-CM

## 2022-05-27 DIAGNOSIS — E1121 Type 2 diabetes mellitus with diabetic nephropathy: Secondary | ICD-10-CM | POA: Diagnosis not present

## 2022-05-27 DIAGNOSIS — Z743 Need for continuous supervision: Secondary | ICD-10-CM | POA: Diagnosis not present

## 2022-05-27 DIAGNOSIS — Z8249 Family history of ischemic heart disease and other diseases of the circulatory system: Secondary | ICD-10-CM

## 2022-05-27 DIAGNOSIS — Z86010 Personal history of colonic polyps: Secondary | ICD-10-CM

## 2022-05-27 DIAGNOSIS — I132 Hypertensive heart and chronic kidney disease with heart failure and with stage 5 chronic kidney disease, or end stage renal disease: Secondary | ICD-10-CM | POA: Diagnosis present

## 2022-05-27 DIAGNOSIS — I429 Cardiomyopathy, unspecified: Secondary | ICD-10-CM | POA: Diagnosis present

## 2022-05-27 DIAGNOSIS — E1151 Type 2 diabetes mellitus with diabetic peripheral angiopathy without gangrene: Secondary | ICD-10-CM | POA: Diagnosis not present

## 2022-05-27 DIAGNOSIS — N250 Renal osteodystrophy: Secondary | ICD-10-CM | POA: Diagnosis not present

## 2022-05-27 DIAGNOSIS — K2951 Unspecified chronic gastritis with bleeding: Principal | ICD-10-CM | POA: Diagnosis present

## 2022-05-27 DIAGNOSIS — I25708 Atherosclerosis of coronary artery bypass graft(s), unspecified, with other forms of angina pectoris: Secondary | ICD-10-CM | POA: Diagnosis not present

## 2022-05-27 DIAGNOSIS — Z800 Family history of malignant neoplasm of digestive organs: Secondary | ICD-10-CM

## 2022-05-27 DIAGNOSIS — D631 Anemia in chronic kidney disease: Secondary | ICD-10-CM | POA: Diagnosis present

## 2022-05-27 DIAGNOSIS — R58 Hemorrhage, not elsewhere classified: Secondary | ICD-10-CM | POA: Diagnosis not present

## 2022-05-27 DIAGNOSIS — G4730 Sleep apnea, unspecified: Secondary | ICD-10-CM | POA: Diagnosis not present

## 2022-05-27 DIAGNOSIS — E1122 Type 2 diabetes mellitus with diabetic chronic kidney disease: Secondary | ICD-10-CM | POA: Diagnosis present

## 2022-05-27 DIAGNOSIS — K317 Polyp of stomach and duodenum: Secondary | ICD-10-CM | POA: Diagnosis present

## 2022-05-27 DIAGNOSIS — Z833 Family history of diabetes mellitus: Secondary | ICD-10-CM

## 2022-05-27 DIAGNOSIS — K2289 Other specified disease of esophagus: Secondary | ICD-10-CM | POA: Diagnosis not present

## 2022-05-27 DIAGNOSIS — C159 Malignant neoplasm of esophagus, unspecified: Secondary | ICD-10-CM | POA: Diagnosis present

## 2022-05-27 DIAGNOSIS — I509 Heart failure, unspecified: Secondary | ICD-10-CM | POA: Diagnosis not present

## 2022-05-27 DIAGNOSIS — Z951 Presence of aortocoronary bypass graft: Secondary | ICD-10-CM

## 2022-05-27 DIAGNOSIS — K209 Esophagitis, unspecified without bleeding: Secondary | ICD-10-CM | POA: Diagnosis not present

## 2022-05-27 DIAGNOSIS — Z66 Do not resuscitate: Secondary | ICD-10-CM | POA: Diagnosis present

## 2022-05-27 DIAGNOSIS — H5461 Unqualified visual loss, right eye, normal vision left eye: Secondary | ICD-10-CM | POA: Diagnosis present

## 2022-05-27 DIAGNOSIS — K922 Gastrointestinal hemorrhage, unspecified: Principal | ICD-10-CM | POA: Diagnosis present

## 2022-05-27 DIAGNOSIS — K2270 Barrett's esophagus without dysplasia: Secondary | ICD-10-CM | POA: Diagnosis present

## 2022-05-27 DIAGNOSIS — I252 Old myocardial infarction: Secondary | ICD-10-CM

## 2022-05-27 DIAGNOSIS — R9431 Abnormal electrocardiogram [ECG] [EKG]: Secondary | ICD-10-CM | POA: Diagnosis not present

## 2022-05-27 DIAGNOSIS — Z8673 Personal history of transient ischemic attack (TIA), and cerebral infarction without residual deficits: Secondary | ICD-10-CM

## 2022-05-27 HISTORY — PX: ESOPHAGOGASTRODUODENOSCOPY (EGD) WITH PROPOFOL: SHX5813

## 2022-05-27 LAB — COMPREHENSIVE METABOLIC PANEL
ALT: 17 U/L (ref 0–44)
AST: 15 U/L (ref 15–41)
Albumin: 3.7 g/dL (ref 3.5–5.0)
Alkaline Phosphatase: 111 U/L (ref 38–126)
Anion gap: 15 (ref 5–15)
BUN: 66 mg/dL — ABNORMAL HIGH (ref 8–23)
CO2: 24 mmol/L (ref 22–32)
Calcium: 8.6 mg/dL — ABNORMAL LOW (ref 8.9–10.3)
Chloride: 96 mmol/L — ABNORMAL LOW (ref 98–111)
Creatinine, Ser: 4.84 mg/dL — ABNORMAL HIGH (ref 0.61–1.24)
GFR, Estimated: 13 mL/min — ABNORMAL LOW (ref >60)
Glucose, Bld: 231 mg/dL — ABNORMAL HIGH (ref 70–99)
Potassium: 4.5 mmol/L (ref 3.5–5.1)
Sodium: 135 mmol/L (ref 135–145)
Total Bilirubin: 1.1 mg/dL (ref 0.3–1.2)
Total Protein: 7 g/dL (ref 6.5–8.1)

## 2022-05-27 LAB — I-STAT CHEM 8, ED
BUN: 69 mg/dL — ABNORMAL HIGH (ref 8–23)
BUN: 85 mg/dL — ABNORMAL HIGH (ref 8–23)
Calcium, Ion: 1.02 mmol/L — ABNORMAL LOW (ref 1.15–1.40)
Calcium, Ion: 0.92 mmol/L — ABNORMAL LOW (ref 1.15–1.40)
Chloride: 99 mmol/L (ref 98–111)
Chloride: 98 mmol/L (ref 98–111)
Creatinine, Ser: 5.5 mg/dL — ABNORMAL HIGH (ref 0.61–1.24)
Creatinine, Ser: 5.5 mg/dL — ABNORMAL HIGH (ref 0.61–1.24)
Glucose, Bld: 233 mg/dL — ABNORMAL HIGH (ref 70–99)
Glucose, Bld: 230 mg/dL — ABNORMAL HIGH (ref 70–99)
HCT: 39 % (ref 39.0–52.0)
HCT: 39 % (ref 39.0–52.0)
Hemoglobin: 13.3 g/dL (ref 13.0–17.0)
Hemoglobin: 13.3 g/dL (ref 13.0–17.0)
Potassium: 4.8 mmol/L (ref 3.5–5.1)
Potassium: 5.5 mmol/L — ABNORMAL HIGH (ref 3.5–5.1)
Sodium: 133 mmol/L — ABNORMAL LOW (ref 135–145)
Sodium: 133 mmol/L — ABNORMAL LOW (ref 135–145)
TCO2: 24 mmol/L (ref 22–32)
TCO2: 26 mmol/L (ref 22–32)

## 2022-05-27 LAB — TYPE AND SCREEN
ABO/RH(D): B POS
Antibody Screen: NEGATIVE

## 2022-05-27 LAB — CBC WITH DIFFERENTIAL/PLATELET
Abs Immature Granulocytes: 0.03 10*3/uL (ref 0.00–0.07)
Basophils Absolute: 0.1 10*3/uL (ref 0.0–0.1)
Basophils Relative: 1 %
Eosinophils Absolute: 0.1 10*3/uL (ref 0.0–0.5)
Eosinophils Relative: 2 %
HCT: 39.9 % (ref 39.0–52.0)
Hemoglobin: 12.1 g/dL — ABNORMAL LOW (ref 13.0–17.0)
Immature Granulocytes: 0 %
Lymphocytes Relative: 9 %
Lymphs Abs: 0.8 10*3/uL (ref 0.7–4.0)
MCH: 26.8 pg (ref 26.0–34.0)
MCHC: 30.3 g/dL (ref 30.0–36.0)
MCV: 88.3 fL (ref 80.0–100.0)
Monocytes Absolute: 0.7 10*3/uL (ref 0.1–1.0)
Monocytes Relative: 8 %
Neutro Abs: 6.9 10*3/uL (ref 1.7–7.7)
Neutrophils Relative %: 80 %
Platelets: 244 10*3/uL (ref 150–400)
RBC: 4.52 MIL/uL (ref 4.22–5.81)
RDW: 18.4 % — ABNORMAL HIGH (ref 11.5–15.5)
WBC: 8.6 10*3/uL (ref 4.0–10.5)
nRBC: 0 % (ref 0.0–0.2)

## 2022-05-27 LAB — GLUCOSE, CAPILLARY
Glucose-Capillary: 149 mg/dL — ABNORMAL HIGH (ref 70–99)
Glucose-Capillary: 125 mg/dL — ABNORMAL HIGH (ref 70–99)

## 2022-05-27 SURGERY — ESOPHAGOGASTRODUODENOSCOPY (EGD) WITH PROPOFOL
Anesthesia: General

## 2022-05-27 MED ORDER — PANTOPRAZOLE INFUSION (NEW) - SIMPLE MED
8.0000 mg/h | INTRAVENOUS | Status: AC
  Administered 2022-05-27 – 2022-05-29 (×4): 8 mg/h via INTRAVENOUS
  Filled 2022-05-27 (×3): qty 80
  Filled 2022-05-27 (×3): qty 100
  Filled 2022-05-27: qty 80

## 2022-05-27 MED ORDER — PROCHLORPERAZINE EDISYLATE 10 MG/2ML IJ SOLN
10.0000 mg | Freq: Once | INTRAMUSCULAR | Status: AC
  Administered 2022-05-27: 10 mg via INTRAVENOUS
  Filled 2022-05-27: qty 2

## 2022-05-27 MED ORDER — INSULIN ASPART 100 UNIT/ML IJ SOLN: 0.0000 [IU] | INTRAMUSCULAR | Status: DC

## 2022-05-27 MED ORDER — PROPOFOL 10 MG/ML IV BOLUS
INTRAVENOUS | Status: DC | PRN
  Administered 2022-05-27: 90 mg via INTRAVENOUS

## 2022-05-27 MED ORDER — DOXAZOSIN MESYLATE 2 MG PO TABS
2.0000 mg | ORAL_TABLET | Freq: Every day | ORAL | Status: DC
  Administered 2022-05-28: 2 mg via ORAL
  Filled 2022-05-27: qty 1

## 2022-05-27 MED ORDER — ARIPIPRAZOLE 5 MG PO TABS
5.0000 mg | ORAL_TABLET | Freq: Every day | ORAL | Status: DC
  Administered 2022-05-28 – 2022-05-31 (×4): 5 mg via ORAL
  Filled 2022-05-27 (×4): qty 1

## 2022-05-27 MED ORDER — PANTOPRAZOLE SODIUM 40 MG IV SOLR
40.0000 mg | Freq: Two times a day (BID) | INTRAVENOUS | Status: DC
  Administered 2022-05-31 (×2): 40 mg via INTRAVENOUS
  Filled 2022-05-27 (×2): qty 10

## 2022-05-27 MED ORDER — SERTRALINE HCL 50 MG PO TABS
100.0000 mg | ORAL_TABLET | Freq: Every day | ORAL | Status: DC
  Administered 2022-05-28 – 2022-05-31 (×4): 100 mg via ORAL
  Filled 2022-05-27 (×4): qty 2

## 2022-05-27 MED ORDER — ACETAMINOPHEN 325 MG PO TABS: 650.0000 mg | ORAL_TABLET | Freq: Four times a day (QID) | ORAL | Status: DC | PRN

## 2022-05-27 MED ORDER — SODIUM CHLORIDE 0.9 % IV BOLUS
500.0000 mL | Freq: Once | INTRAVENOUS | Status: AC
  Administered 2022-05-27: 500 mL via INTRAVENOUS

## 2022-05-27 MED ORDER — PROPOFOL 500 MG/50ML IV EMUL
INTRAVENOUS | Status: AC
  Filled 2022-05-27: qty 50

## 2022-05-27 MED ORDER — ONDANSETRON 4 MG PO TBDP
4.0000 mg | ORAL_TABLET | Freq: Once | ORAL | Status: DC
  Filled 2022-05-27: qty 1

## 2022-05-27 MED ORDER — ONDANSETRON HCL 4 MG/2ML IJ SOLN: 4.0000 mg | Freq: Four times a day (QID) | INTRAMUSCULAR | Status: DC | PRN

## 2022-05-27 MED ORDER — CHLORHEXIDINE GLUCONATE CLOTH 2 % EX PADS
6.0000 | MEDICATED_PAD | Freq: Every day | CUTANEOUS | Status: DC
  Administered 2022-05-28 – 2022-05-30 (×3): 6 via TOPICAL

## 2022-05-27 MED ORDER — ONDANSETRON HCL 4 MG PO TABS: 4.0000 mg | ORAL_TABLET | Freq: Four times a day (QID) | ORAL | Status: DC | PRN

## 2022-05-27 MED ORDER — SODIUM CHLORIDE 0.9 % IV SOLN: INTRAVENOUS | Status: DC | PRN

## 2022-05-27 MED ORDER — PANTOPRAZOLE SODIUM 40 MG IV SOLR
40.0000 mg | Freq: Once | INTRAVENOUS | Status: AC
  Administered 2022-05-27: 40 mg via INTRAVENOUS
  Filled 2022-05-27: qty 10

## 2022-05-27 MED ORDER — METOPROLOL SUCCINATE ER 50 MG PO TB24
50.0000 mg | ORAL_TABLET | Freq: Every day | ORAL | Status: DC
  Administered 2022-05-27 – 2022-05-30 (×4): 50 mg via ORAL
  Filled 2022-05-27 (×5): qty 1

## 2022-05-27 MED ORDER — ETOMIDATE 2 MG/ML IV SOLN
INTRAVENOUS | Status: AC
  Filled 2022-05-27: qty 20

## 2022-05-27 MED ORDER — AMIODARONE HCL 200 MG PO TABS
200.0000 mg | ORAL_TABLET | Freq: Every day | ORAL | Status: DC
  Administered 2022-05-28 – 2022-05-30 (×3): 200 mg via ORAL
  Filled 2022-05-27 (×4): qty 1

## 2022-05-27 MED ORDER — ACETAMINOPHEN 650 MG RE SUPP: 650.0000 mg | Freq: Four times a day (QID) | RECTAL | Status: DC | PRN

## 2022-05-27 MED ORDER — ONDANSETRON HCL 4 MG/2ML IJ SOLN
4.0000 mg | Freq: Once | INTRAMUSCULAR | Status: AC
  Administered 2022-05-27: 4 mg via INTRAVENOUS

## 2022-05-27 MED ORDER — PANTOPRAZOLE 80MG IVPB - SIMPLE MED
80.0000 mg | Freq: Once | INTRAVENOUS | Status: AC
  Administered 2022-05-27: 80 mg via INTRAVENOUS
  Filled 2022-05-27: qty 100

## 2022-05-27 MED ORDER — ONDANSETRON HCL 4 MG/2ML IJ SOLN
4.0000 mg | Freq: Three times a day (TID) | INTRAMUSCULAR | Status: DC
  Administered 2022-05-27 – 2022-05-31 (×11): 4 mg via INTRAVENOUS
  Filled 2022-05-27 (×12): qty 2

## 2022-05-27 MED ORDER — ISOSORBIDE DINITRATE 20 MG PO TABS
30.0000 mg | ORAL_TABLET | Freq: Three times a day (TID) | ORAL | Status: DC
  Administered 2022-05-27 – 2022-05-31 (×12): 30 mg via ORAL
  Filled 2022-05-27 (×7): qty 1
  Filled 2022-05-27: qty 2
  Filled 2022-05-27 (×2): qty 1
  Filled 2022-05-27: qty 2
  Filled 2022-05-27 (×2): qty 1

## 2022-05-27 MED ORDER — PROCHLORPERAZINE EDISYLATE 10 MG/2ML IJ SOLN: 10.0000 mg | Freq: Four times a day (QID) | INTRAMUSCULAR | Status: DC | PRN

## 2022-05-27 MED ORDER — CALCIUM ACETATE (PHOS BINDER) 667 MG PO CAPS
667.0000 mg | ORAL_CAPSULE | Freq: Three times a day (TID) | ORAL | Status: DC
  Administered 2022-05-30 – 2022-05-31 (×4): 667 mg via ORAL
  Filled 2022-05-27 (×4): qty 1

## 2022-05-27 MED ORDER — EPINEPHRINE 1 MG/10ML IJ SOSY
PREFILLED_SYRINGE | INTRAMUSCULAR | Status: AC
  Filled 2022-05-27: qty 10

## 2022-05-27 NOTE — Hospital Course (Addendum)
66 year old male with a history of coronary disease status post CABG, systolic and diastolic CHF, diabetes mellitus type 2, hypertension, ESRD, SVT, esophageal adenocarcinoma, and OSA presenting with hematemesis during his dialysis session earlier today on 05/27/2022.  The patient actually states that he had an episode of hematemesis on the evening of 05/26/2022.  The patient was recently admitted to the hospital from 05/03/2022 to 05/05/2022 secondary to Enterobacter cloacae bacteremia.  He was treated with cefepime on dialysis.  He was also fluid overloaded during that admission.  He was discharged in stable condition to complete a course of cefepime. His dialysis catheter was removed on 05/24/2022 at [facility], and a new dialysis catheter was placed on the same day. He was at [facility] to have his usual surveillance EGD on 05/26/2022, but ended up having SVT and hypotension.  He was transferred to the ED.  He stabilized and was discharged home.  Notably, the patient was recently admitted to the hospital The patient had been in his usual state of health until yesterday, 05/26/2022.  Apparently he was going for an EGD, but he was noted to have SVT with heart rate in the 160s.  It was canceled.  The patient denies any NSAIDs. He only completed 30 minutes of his dialysis on 05/27/2022. Patient denies fevers, chills, headache, chest pain, dyspnea, nausea, vomiting, diarrhea, abdominal pain, dysuria, hematuria, hematochezia, and melena. In the ED, the patient was afebrile hemodynamically stable with oxygen saturation 100% room air.  WBC 8.6, hemoglobin 12.1, platelets 244,000.  Sodium 135, potassium 4.5, bicarbonate 24, serum creatinine 4.4.  LFTs were unremarkable.  CT of the abdomen and chest did not show any metastasis.  There is no obvious mass in the esophagus.  There is interval improvement of aeration in the right lung with persistent right pleural effusion.  There is no bowel wall  thickening.  Left lung is clear.  GI was consulted to assist with management.

## 2022-05-27 NOTE — ED Provider Notes (Signed)
[facility] EMERGENCY DEPARTMENT Provider Note   CSN: [id number] Arrival date & time: 05/27/22  1112     History {Add pertinent medical, surgical, social history, OB history to HPI:1} Chief Complaint  Patient presents with   Hypotension    [name] is a 66 y.o. male.  Patient has history of diabetes hypertension end-stage renal disease esophageal cancer he was in dialysis today and started vomiting blood.   Emesis      Home Medications Prior to Admission medications   Medication Sig Start Date End Date Taking? Authorizing Provider  acetaminophen (TYLENOL) 325 MG tablet Take 2 tablets (650 mg total) by mouth every 6 (six) hours as needed for mild pain, moderate pain or fever. 08/02/21   Allie Bossier, MD  albuterol (VENTOLIN HFA) 108 (90 Base) MCG/ACT inhaler Inhale 2 puffs into the lungs every 6 (six) hours as needed. Patient taking differently: Inhale 2 puffs into the lungs every 6 (six) hours as needed for wheezing or shortness of breath. 06/24/21   Coral Spikes, DO  amiodarone (PACERONE) 200 MG tablet Take 1 tablet (200 mg total) by mouth daily. 200 mg once daily 05/05/22   Johnson, Clanford L, MD  ARIPiprazole (ABILIFY) 5 MG tablet Take 5 mg by mouth daily.    [provider]  ascorbic acid (VITAMIN C) 500 MG tablet Take 1 tablet (500 mg total) by mouth daily. 08/03/21   Allie Bossier, MD  aspirin EC 325 MG tablet Take 325 mg by mouth daily. 02/21/22   [provider]  calcium acetate (PHOSLO) 667 MG capsule Take 667 mg by mouth in the morning, at noon, and at bedtime.    [provider]  calcium-vitamin D (OSCAL WITH D) 500-5 MG-MCG tablet Take 1 tablet by mouth 2 (two) times daily.    [provider]  doxazosin (CARDURA) 2 MG tablet Take 1 tablet (2 mg total) by mouth daily. 03/15/22   [name], MD  EPINEPHrine 0.3 mg/0.3 mL IJ SOAJ injection Inject 0.3 mg into the muscle as needed for anaphylaxis. 01/25/21   [name], MD  hydrALAZINE (APRESOLINE) 50 MG tablet TAKE 1 TABLET BY MOUTH EVERY 8 HOURS 04/13/22   [name], [name], MD  isosorbide dinitrate (ISORDIL) 30 MG tablet Take 30 mg by mouth 3 (three) times daily. 02/21/22   [provider]  lidocaine (ASPERCREME LIDOCAINE) 4 % Place 1 patch onto the skin daily. Wear for 12 hours then remove, wait 12 hours before applying a new patch to lower back    [provider]  metoprolol succinate (TOPROL-XL) 50 MG 24 hr tablet Take 1 tablet (50 mg total) by mouth daily. Take with or immediately following a meal. 03/15/22   [name], [name], MD  Multiple Vitamins-Minerals [facility] COMPLETE) TABS Take 1 tablet by mouth daily.    [provider]  mupirocin ointment (BACTROBAN) 2 % Apply thin amount twice daily to open wounds on the legs 05/12/22   [name], [name], MD  nitroGLYCERIN (NITROSTAT) 0.4 MG SL tablet Place 1 tablet (0.4 mg total) under the tongue every 5 (five) minutes as needed for chest pain. 09/14/21   Loel Dubonnet, NP  Nutritional Supplements (FEEDING SUPPLEMENT, NEPRO CARB STEADY,) LIQD Take 237 mLs by mouth 2 (two) times daily with a meal.    [provider]  ondansetron (ZOFRAN-ODT) 4 MG disintegrating tablet TAKE 1 TABLET BY MOUTH EVERY 8 HOURS AS NEEDED FOR NAUSEA AND VOMITING 05/12/22  [name], MD  pantoprazole (PROTONIX) 40 MG tablet TAKE 1 TABLET BY MOUTH TWICE DAILY 04/13/22   [name], [name], MD  REPATHA SURECLICK 160 MG/ML SOAJ Inject 140 mg as directed every 14 (fourteen) days. Patient taking differently: Inject 140 mg as directed every 14 (fourteen) days. Starting 02/08/22 09/14/21   Loel Dubonnet, NP  sertraline (ZOLOFT) 100 MG tablet 1 daily Patient taking differently: Take 100 mg by mouth daily. 02/11/22   [name], MD      Allergies    Bee venom, Atorvastatin, Diltiazem, Reglan [metoclopramide], Rosuvastatin, and Valsartan    Review of Systems   Review of Systems  Gastrointestinal:   Positive for vomiting.    Physical Exam Updated Vital Signs BP (!) 143/82 (BP Location: Right Arm)   Pulse 93   Temp 98.1 F (36.7 C) (Oral)   Resp 18   Ht '5\' 10"'$  (1.778 m)   Wt 86.2 kg   SpO2 100%   BMI 27.26 kg/m  Physical Exam  ED Results / Procedures / Treatments   Labs (all labs ordered are listed, but only abnormal results are displayed) Labs Reviewed  CBC WITH DIFFERENTIAL/PLATELET - Abnormal; Notable for the following components:      Result Value   Hemoglobin 12.1 (*)    RDW 18.4 (*)    All other components within normal limits  COMPREHENSIVE METABOLIC PANEL - Abnormal; Notable for the following components:   Chloride 96 (*)    Glucose, Bld 231 (*)    BUN 66 (*)    Creatinine, Ser 4.84 (*)    Calcium 8.6 (*)    GFR, Estimated 13 (*)    All other components within normal limits  I-STAT CHEM 8, ED - Abnormal; Notable for the following components:   Sodium 133 (*)    BUN 69 (*)    Creatinine, Ser 5.50 (*)    Glucose, Bld 233 (*)    Calcium, Ion 1.02 (*)    All other components within normal limits  I-STAT CHEM 8, ED - Abnormal; Notable for the following components:   Sodium 133 (*)    Potassium 5.5 (*)    BUN 85 (*)    Creatinine, Ser 5.50 (*)    Glucose, Bld 230 (*)    Calcium, Ion 0.92 (*)    All other components within normal limits  TYPE AND SCREEN    EKG None  Radiology No results found.  Procedures Procedures  {Document cardiac monitor, telemetry assessment procedure when appropriate:1}  Medications Ordered in ED Medications  pantoprazole (PROTONIX) 80 mg /NS 100 mL IVPB (has no administration in time range)  pantoprozole (PROTONIX) 80 mg /NS 100 mL infusion (has no administration in time range)  pantoprazole (PROTONIX) injection 40 mg (has no administration in time range)  sodium chloride 0.9 % bolus 500 mL (0 mLs Intravenous Stopped 05/27/22 1313)  pantoprazole (PROTONIX) injection 40 mg (40 mg Intravenous Given 05/27/22 1209)   ondansetron (ZOFRAN) injection 4 mg (4 mg Intravenous Given 05/27/22 1202)  prochlorperazine (COMPAZINE) injection 10 mg (10 mg Intravenous Given 05/27/22 1313)  sodium chloride 0.9 % bolus 500 mL (500 mLs Intravenous New Bag/Given 05/27/22 1313)    ED Course/ Medical Decision Making/ A&P  CRITICAL CARE Performed by: Milton [name] Total critical care time: 40 minutes Critical care time was exclusive of separately billable procedures and treating other patients. Critical care was necessary to treat or prevent imminent or life-threatening deterioration. Critical care was time spent personally by  me on the following activities: development of treatment plan with patient and/or surrogate as well as nursing, discussions with consultants, evaluation of patient's response to treatment, examination of patient, obtaining history from patient or surrogate, ordering and performing treatments and interventions, ordering and review of laboratory studies, ordering and review of radiographic studies, pulse oximetry and re-evaluation of patient's condition.                          Medical Decision Making Amount and/or Complexity of Data Reviewed Labs: ordered.  Risk Prescription drug management. Decision regarding hospitalization.   Patient with esophageal cancer and vomiting blood.  He is started on Protonix and will see GI today and possibly get an upper endoscopy done.  He is admitted to medicine  {Document critical care time when appropriate:1} {Document review of labs and clinical decision tools ie heart score, Chads2Vasc2 etc:1}  {Document your independent review of radiology images, and any outside records:1} {Document your discussion with family members, caretakers, and with consultants:1} {Document social determinants of health affecting pt's care:1} {Document your decision making why or why not admission, treatments were needed:1} Final Clinical Impression(s) / ED Diagnoses Final  diagnoses:  Upper GI bleed    Rx / DC Orders ED Discharge Orders     None

## 2022-05-27 NOTE — Progress Notes (Signed)
Bo, Dialysis RN came to room to deaccess left arm fistula that was accessed from patient dialysis center earlier today. Clean, dry dressings in place. BO also changed the dialysis catheter on right side of upper chest due to dressing being soiled/dirty. Both left arm and right upper chest dressings are currently clean, dry and intact.

## 2022-05-27 NOTE — Progress Notes (Signed)
Caregiver notified that patient transported to ICU 5

## 2022-05-27 NOTE — Transfer of Care (Signed)
Immediate Anesthesia Transfer of Care Note  Patient: [name]  Procedure(s) Performed: ESOPHAGOGASTRODUODENOSCOPY (EGD) WITH PROPOFOL  Patient Location: PACU  Anesthesia Type:General  Level of Consciousness: awake and alert   Airway & Oxygen Therapy: Patient Spontanous Breathing and Patient connected to nasal cannula oxygen  Post-op Assessment: Report given to RN and Post -op Vital signs reviewed and stable  Post vital signs: Reviewed and stable  Last Vitals:  Vitals Value Taken Time  BP 141/87   Temp 97.5   Pulse 88   Resp 24   SpO2 98     Last Pain:  Vitals:   05/27/22 1457  TempSrc:   PainSc: 0-No pain         Complications: No notable events documented.

## 2022-05-27 NOTE — ED Triage Notes (Signed)
EMS was at dialysis center when he started vomiting blood. States that he was hypotensive on arrival with SBP in 80's. EMS infusing NS though temp dialysis cath. Complaining of headache. Patient completed on 30 minutes of treatment. States that headache started while at facility. Denies vision changes, etc. States that he feels sleepy. States that he has now developed indigestion.

## 2022-05-27 NOTE — Anesthesia Preprocedure Evaluation (Signed)
Anesthesia Evaluation  Patient identified by MRN, date of birth, ID band Patient awake    Reviewed: Allergy & Precautions, H&P , NPO status , Patient's Chart, lab work & pertinent test results, reviewed documented beta blocker date and time   Airway Mallampati: II  TM Distance: >3 FB Neck ROM: full    Dental no notable dental hx.    Pulmonary sleep apnea , former smoker   Pulmonary exam normal breath sounds clear to auscultation       Cardiovascular Exercise Tolerance: Good hypertension, + angina  + CAD, + Past MI, + Peripheral Vascular Disease and +CHF   Rhythm:regular Rate:Normal     Neuro/Psych  PSYCHIATRIC DISORDERS  Depression     Neuromuscular disease CVA, Residual Symptoms    GI/Hepatic Neg liver ROS,GERD  Medicated,,  Endo/Other  negative endocrine ROSdiabetes    Renal/GU CRF, ESRF and DialysisRenal disease  negative genitourinary   Musculoskeletal   Abdominal   Peds  Hematology  (+) Blood dyscrasia, anemia   Anesthesia Other Findings   Reproductive/Obstetrics negative OB ROS                             Anesthesia Physical Anesthesia Plan  ASA: 4 and emergent  Anesthesia Plan: General and General ETT   Post-op Pain Management:    Induction:   PONV Risk Score and Plan:   Airway Management Planned:   Additional Equipment:   Intra-op Plan:   Post-operative Plan:   Informed Consent: I have reviewed the patients History and Physical, chart, labs and discussed the procedure including the risks, benefits and alternatives for the proposed anesthesia with the patient or authorized representative who has indicated his/her understanding and acceptance.     Dental Advisory Given  Plan Discussed with: CRNA  Anesthesia Plan Comments:        Anesthesia Quick Evaluation

## 2022-05-27 NOTE — Brief Op Note (Signed)
05/27/2022  3:26 PM  PATIENT:  [name]  66 y.o. male  PRE-OPERATIVE DIAGNOSIS:  hematemesis  POST-OPERATIVE DIAGNOSIS:  large clot in esophagus, old blood in stomach  PROCEDURE:  Procedure(s): ESOPHAGOGASTRODUODENOSCOPY (EGD) WITH PROPOFOL (N/A)  SURGEON:  Surgeon(s) and Role:    * Harvel Quale, MD - Primary  Patient underwent EGD under propofol sedation.  Tolerated the procedure adequately.  Findings: - Large amopunt of clotted blood in the esophagus. Possible MW tear? - Clotted blood in the entire stomach.  - Normal examined duodenum.  - No specimens collected.   RECOMMENDATIONS - Admit the patient to ICU.  - NPO.  - Check H/H every 8 hours - PPI drip. - Standing Zofran 4 mg every 8 hours - Compazine PRN if recurrent nausea/vomiting. - Will need repeat EGD during this admission, possibly on Monday in case surgical/IR backup is needed.  [name], MD Gastroenterology and Hepatology [facility] Gastroenterology

## 2022-05-27 NOTE — H&P (Addendum)
History and Physical    Patient: [name] BZJ:[id number] DOB: 11/27/1955 DOA: 05/27/2022 DOS: the patient was seen and examined on 05/27/2022 PCP: [name], MD  Patient coming from: Home  Chief Complaint:  Chief Complaint  Patient presents with   Hypotension   HPI: [name] is a 66 year old male with a history of coronary disease status post CABG, systolic and diastolic CHF, diabetes mellitus type 2, hypertension, ESRD, SVT, esophageal adenocarcinoma, and OSA presenting with hematemesis during his dialysis session earlier today on 05/27/2022.  The patient actually states that he had an episode of hematemesis on the evening of 05/26/2022.  The patient was recently admitted to the hospital from 05/03/2022 to 05/05/2022 secondary to Enterobacter cloacae bacteremia.  He was treated with cefepime on dialysis.  He was also fluid overloaded during that admission.  He was discharged in stable condition to complete a course of cefepime. His dialysis catheter was removed on 05/24/2022 at [place] [facility], and a new dialysis catheter was placed on the same day. He was at [facility] to have his usual surveillance EGD on 05/26/2022, but ended up having SVT and hypotension.  He was transferred to the ED.  He stabilized and was discharged home.  Notably, the patient was recently admitted to the hospital The patient had been in his usual state of health until yesterday, 05/26/2022.  Apparently he was going for an EGD, but he was noted to have SVT with heart rate in the 160s.  It was canceled.  The patient denies any NSAIDs. He only completed 30 minutes of his dialysis on 05/27/2022. Patient denies fevers, chills, headache, chest pain, dyspnea, nausea, vomiting, diarrhea, abdominal pain, dysuria, hematuria, hematochezia, and melena. In the ED, the patient was afebrile hemodynamically stable with oxygen saturation 100% room air.  WBC 8.6, hemoglobin 12.1, platelets 244,000.  Sodium  135, potassium 4.5, bicarbonate 24, serum creatinine 4.4.  LFTs were unremarkable.  CT of the abdomen and chest did not show any metastasis.  There is no obvious mass in the esophagus.  There is interval improvement of aeration in the right lung with persistent right pleural effusion.  There is no bowel wall thickening.  Left lung is clear.  GI was consulted to assist with management.  Review of Systems: As mentioned in the history of present illness. All other systems reviewed and are negative. Past Medical History:  Diagnosis Date   Anemia    Arthritis    hips shoulders   CAD (coronary artery disease) 03/03/2019   Cancer ([place])    Esophageal cancer 2022   CHF (congestive heart failure) ([place]) 2020   Chronic combined systolic and diastolic heart failure ([place]) 04/26/2018   Admitted 11/14-11/20/19-diuresed 10L   Depression 05/31/2021   Fatigue 10/14/2020   Hypertension    Myocardial infarction [facility])    Sleep apnea    uses a bipap machine   Stroke ([place]) 12/14/2020   no last weakness or paralysis   Type 2 diabetes mellitus with diabetic nephropathy ([place]) 04/23/2019   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   Past Surgical History:  Procedure Laterality Date   AV FISTULA PLACEMENT Left 12/21/2021   Procedure: LEFT ARM ARTERIOVENOUS (AV) FISTULA CREATION;  Surgeon: Rosetta Posner, MD;  Location: [facility];  Service: Vascular;  Laterality: Left;   BIOPSY  06/27/2019   Procedure: BIOPSY;  Surgeon: Rogene Houston, MD;  [facility];  Service: Endoscopy;;  ascending colon polyp   BIOPSY  10/21/2020   Procedure: BIOPSY;  Surgeon: Rogene Houston, MD;  [facility];  Service: Endoscopy;;  esophageal,gastric polyp   BIOPSY  02/24/2021   Procedure: BIOPSY;  Surgeon: Rogene Houston, MD;  [facility];  Service: Endoscopy;;  distal and proximal esophageal biopsies    CARDIAC SURGERY     CATARACT EXTRACTION     COLONOSCOPY N/A 06/27/2019   [name]:Diverticulosis in the  entire examined colon. tubular adenoma in ascending colon, 65m tubular adenoma in prox sigmoid. external hemorrhoids   CORONARY ARTERY BYPASS GRAFT N/A 03/25/2019   Procedure: CORONARY ARTERY BYPASS GRAFTING (CABG) X  4 USING LEFT INTERNAL MAMMARY ARTERY AND RIGHT SAPHENOUS VEIN GRAFTS;  Surgeon: LLajuana Matte MD;  [place]  Service: Open Heart Surgery;  Laterality: N/A;   ESOPHAGEAL BRUSHING  03/08/2022   Procedure: ESOPHAGEAL BRUSHING;  Surgeon: CEloise Harman DO;  [facility];  Service: Endoscopy;;   ESOPHAGOGASTRODUODENOSCOPY (EGD) WITH PROPOFOL N/A 10/21/2020   rehman:Normal hypopharynx.Normal proximal esophagus and mid esophagus.Esophageal mucosal changes consistent with long-segment Barrett's esophagus. (Focal low-grade dysplasia and atypia proximally) z line irregular 37 cm from incisors, 3 cm HH, single gastric polyp (fundic gland) normal duodenal bulb/second portion of duodenum, proximal margin of Barrett's at 34 cm   ESOPHAGOGASTRODUODENOSCOPY (EGD) WITH PROPOFOL N/A 02/24/2021   Procedure: ESOPHAGOGASTRODUODENOSCOPY (EGD) WITH PROPOFOL;  Surgeon: RRogene Houston MD;  [facility];  Service: Endoscopy;  Laterality: N/A;  1:35   ESOPHAGOGASTRODUODENOSCOPY (EGD) WITH PROPOFOL N/A 03/21/2021   Procedure: ESOPHAGOGASTRODUODENOSCOPY (EGD) WITH PROPOFOL;  Surgeon: HCarol Ada MD;  [place]  Service: Endoscopy;  Laterality: N/A;   ESOPHAGOGASTRODUODENOSCOPY (EGD) WITH PROPOFOL N/A 10/23/2021   Procedure: ESOPHAGOGASTRODUODENOSCOPY (EGD) WITH PROPOFOL;  Surgeon: DDoran Stabler MD;  [place]  Service: Gastroenterology;  Laterality: N/A;   ESOPHAGOGASTRODUODENOSCOPY (EGD) WITH PROPOFOL N/A 03/08/2022   Procedure: ESOPHAGOGASTRODUODENOSCOPY (EGD) WITH PROPOFOL;  Surgeon: CEloise Harman DO;  [facility];  Service: Endoscopy;  Laterality: N/A;   EXCISION MORTON'S NEUROMA     EYE SURGERY Left    retina    INCISION AND DRAINAGE OF WOUND Right 06/28/2021   Procedure: IRRIGATION AND DEBRIDEMENT WOUND;  Surgeon: PCindra Presume MD;  [place] [facility]  Service: Plastics;  Laterality: Right;  1.5 hour   INSERTION OF DIALYSIS CATHETER Right 12/21/2021   Procedure: INSERTION OF TUNNELED DIALYSIS CATHETER;  Surgeon: ERosetta Posner MD;  [facility];  Service: Vascular;  Laterality: Right;   LEFT HEART CATH AND CORS/GRAFTS ANGIOGRAPHY N/A 02/01/2022   Procedure: LEFT HEART CATH AND CORS/GRAFTS ANGIOGRAPHY;  Surgeon: JMartinique Peter M, MD;  [place];  Service: Cardiovascular;  Laterality: N/A;   POLYPECTOMY  06/27/2019   Procedure: POLYPECTOMY;  Surgeon: RRogene Houston MD;  [facility];  Service: Endoscopy;;  proximal sigmoid colon   RIGHT/LEFT HEART CATH AND CORONARY ANGIOGRAPHY N/A 03/12/2019   Procedure: RIGHT/LEFT HEART CATH AND CORONARY ANGIOGRAPHY;  Surgeon: VJettie Booze MD;  [place];  Service: Cardiovascular;  Laterality: N/A;   SHOULDER ARTHROSCOPY WITH ROTATOR CUFF REPAIR AND SUBACROMIAL DECOMPRESSION Right 08/26/2020   Procedure: RIGHT SHOULDER MINI OPEN ROTATOR CUFF REPAIR AND SUBACROMIAL DECOMPRESSION WITH PATCH GRAFT;  Surgeon: BSusa Day MD;  [facility];  Service: Orthopedics;  Laterality: Right;  90 MINS GENERAL WITH BLOCK   SKIN SPLIT GRAFT Right 06/28/2021   Procedure: SKIN GRAFT SPLIT THICKNESS;  Surgeon:  [name], MD;  Location: [place];  Service: Plastics;  Laterality: Right;   TEE WITHOUT CARDIOVERSION N/A 03/25/2019   Procedure: TRANSESOPHAGEAL ECHOCARDIOGRAM (TEE);  Surgeon: [name], MD;  Location: [place];  Service: Open Heart Surgery;  Laterality: N/A;   Social History:  reports that he quit smoking about 33 years ago. His smoking use included cigarettes. He has been exposed to tobacco smoke. He has never used smokeless tobacco. He reports that he does not currently use alcohol after a past usage  of about 2.0 standard drinks of alcohol per week. He reports that he does not use drugs.  Allergies  Allergen Reactions   Bee Venom Anaphylaxis   Atorvastatin Other (See Comments)    myalgia   Diltiazem Itching   Reglan [Metoclopramide] Other (See Comments)    Suicidal    Rosuvastatin Other (See Comments)    myalgias   Valsartan Itching    Family History  Problem Relation Age of Onset   Colon cancer Mother    Heart Problems Father    Diabetes Father    Valvular heart disease Father    Sleep apnea Neg Hx    Stroke Neg Hx     Prior to Admission medications   Medication Sig Start Date End Date Taking? Authorizing Provider  acetaminophen (TYLENOL) 325 MG tablet Take 2 tablets (650 mg total) by mouth every 6 (six) hours as needed for mild pain, moderate pain or fever. 08/02/21   Allie Bossier, MD  albuterol (VENTOLIN HFA) 108 (90 Base) MCG/ACT inhaler Inhale 2 puffs into the lungs every 6 (six) hours as needed. Patient taking differently: Inhale 2 puffs into the lungs every 6 (six) hours as needed for wheezing or shortness of breath. 06/24/21   Coral Spikes, DO  amiodarone (PACERONE) 200 MG tablet Take 1 tablet (200 mg total) by mouth daily. 200 mg once daily 05/05/22   Johnson, Clanford L, MD  ARIPiprazole (ABILIFY) 5 MG tablet Take 5 mg by mouth daily.    [provider]  ascorbic acid (VITAMIN C) 500 MG tablet Take 1 tablet (500 mg total) by mouth daily. 08/03/21   Allie Bossier, MD  aspirin EC 325 MG tablet Take 325 mg by mouth daily. 02/21/22   [provider]  calcium acetate (PHOSLO) 667 MG capsule Take 667 mg by mouth in the morning, at noon, and at bedtime.    [provider]  calcium-vitamin D (OSCAL WITH D) 500-5 MG-MCG tablet Take 1 tablet by mouth 2 (two) times daily.    [provider]  doxazosin (CARDURA) 2 MG tablet Take 1 tablet (2 mg total) by mouth daily. 03/15/22   [name], MD  EPINEPHrine 0.3 mg/0.3 mL IJ SOAJ injection  Inject 0.3 mg into the muscle as needed for anaphylaxis. 01/25/21   [name], MD  hydrALAZINE (APRESOLINE) 50 MG tablet TAKE 1 TABLET BY MOUTH EVERY 8 HOURS 04/13/22   [name], [name], MD  isosorbide dinitrate (ISORDIL) 30 MG tablet Take 30 mg by mouth 3 (three) times daily. 02/21/22   [provider]  lidocaine (ASPERCREME LIDOCAINE) 4 % Place 1 patch onto the skin daily. Wear for 12 hours then remove, wait 12 hours before applying a new patch to lower back    [provider]  metoprolol succinate (TOPROL-XL) 50 MG 24 hr tablet Take 1 tablet (50 mg total) by mouth daily. Take with or immediately following a meal. 03/15/22   Wolfgang Phoenix, [employer]  A, MD  Multiple Vitamins-Minerals (THERATRUM COMPLETE) TABS Take 1 tablet by mouth daily.    [provider]  mupirocin ointment (BACTROBAN) 2 % Apply thin amount twice daily to open wounds on the legs 05/12/22   [name], [name], MD  nitroGLYCERIN (NITROSTAT) 0.4 MG SL tablet Place 1 tablet (0.4 mg total) under the tongue every 5 (five) minutes as needed for chest pain. 09/14/21   Loel Dubonnet, NP  Nutritional Supplements (FEEDING SUPPLEMENT, NEPRO CARB STEADY,) LIQD Take 237 mLs by mouth 2 (two) times daily with a meal.    [provider]  ondansetron (ZOFRAN-ODT) 4 MG disintegrating tablet TAKE 1 TABLET BY MOUTH EVERY 8 HOURS AS NEEDED FOR NAUSEA AND VOMITING 05/12/22   [name], MD  pantoprazole (PROTONIX) 40 MG tablet TAKE 1 TABLET BY MOUTH TWICE DAILY 04/13/22   [name], [name], MD  REPATHA SURECLICK 353 MG/ML SOAJ Inject 140 mg as directed every 14 (fourteen) days. Patient taking differently: Inject 140 mg as directed every 14 (fourteen) days. Starting 02/08/22 09/14/21   Loel Dubonnet, NP  sertraline (ZOLOFT) 100 MG tablet 1 daily Patient taking differently: Take 100 mg by mouth daily. 02/11/22   [name], MD    Physical Exam: Vitals:   05/27/22 1130 05/27/22 1132 05/27/22 1300  BP:  (!) 140/68 (!)  143/82  Pulse:  86 93  Resp:  15 18  Temp:  98.7 F (37.1 C) 98.1 F (36.7 C)  TempSrc:  Oral Oral  SpO2:  97% 100%  Weight: 86.2 kg    Height: '5\' 10"'$  (1.778 m)     GENERAL:  A&O x 3, NAD, well developed, cooperative, follows commands HEENT: [place]/AT, No thrush, No icterus, No oral ulcers Neck:  No neck mass, No meningismus, soft, supple CV: RRR, no S3, no S4, no rub, no JVD Lungs:  CTA, no wheeze, no rhonchi, good air movement Abd: soft/NT +BS, nondistended Ext: No edema, no lymphangitis, no cyanosis, no rashes Neuro:  CN II-XII intact, strength 4/5 in RUE, RLE, strength 4/5 LUE, LLE; sensation intact bilateral; no dysmetria; babinski equivocal   Data Reviewed: Data reviewed above in history  Assessment and Plan: Hematemesis -GI consulted -Start pantoprazole drip -Plans noted for EGD today  Chronic combined systolic and diastolic CHF -Clinically euvolemic -07/25/21 Echo--EF 40-45%, no WMA, mod RV dysfx, PASP 56.5  -03/01/22 Echo--EF 40-45%, global HK, RV overload  -Fluid removal via dialysis  ESRD -He dialyzes on Monday, Wednesday, Friday -Consult nephrology for maintenance dialysis -He was started on dialysis sometime in August 2023 -Nephrology consulted for maintenance dialysis  Right pleural effusion -This has been chronic -He has had previous thoracocentesis in the past, last done 03/01/2022  S/P CABG (coronary artery bypass graft) Recent cardiac cath 8/22, showed patent grafts, severe three-vessel native CAD.Marland Kitchen  History of CABG 2020. -No chest pain presently  SVT history -Continue amiodarone 200 mg daily  Essential hypertension -Restart metoprolol -Holding hydralazine and doxazosin for soft blood pressures  Esophageal adenocarcinoma (HCC) Follows GI at [facility], Dr. [name] --considered poor candidate for esophagectomy. After multidisciplinary discussion the plan was to continue with endoscopic treatments, and continue to follow-up with oncology for  surveillance and any signs of progression of disease -status post EMR, cryotherapy, and most recently RFA in April 2023 -02/10/22 PET scan--no local recurrence      Advance Care Planning: DNR--confirmed with pt and significant other  Consults: GI, renal  Family Communication: significant other 12/15  Severity of Illness: The  appropriate patient status for this patient is OBSERVATION. Observation status is judged to be reasonable and necessary in order to provide the required intensity of service to ensure the patient's safety. The patient's presenting symptoms, physical exam findings, and initial radiographic and laboratory data in the context of their medical condition is felt to place them at decreased risk for further clinical deterioration. Furthermore, it is anticipated that the patient will be medically stable for discharge from the hospital within 2 midnights of admission.   Author: Orson Eva, MD 05/27/2022 2:35 PM  For on call review [email or web address].

## 2022-05-27 NOTE — Progress Notes (Signed)
Patient arrived to unit from PACU, post procedure. Patient is alert and oriented x4 and was able to stand at the side of the bed briefly. Oral suction set up at bedside and patient has vomit bag just in case. Patient thus far has had no episodes of vomiting and denies any nausea, pain or discomfort at this time. Patient tolerated PO meds with sips of water well. OK to give per verbal from Dr Tat and Dr Jenetta Downer. Will continue to monitor. Friend of patient at bedside, emotional support provide.

## 2022-05-27 NOTE — ED Notes (Signed)
Patient transferred to EGD.

## 2022-05-27 NOTE — Op Note (Signed)
[facility] Patient Name: [name] Procedure Date: 05/27/2022 2:49 PM MRN: [id number] Date of Birth: 31-Oct-1955 Attending MD: [name] , , [id number] CSN: [id number] Age: 66 Admit Type: Outpatient Procedure:                Upper GI endoscopy Indications:              Hematemesis Providers:                [name], Rosina Lowenstein, RN, Aram Candela Referring MD:              Medicines:                Monitored Anesthesia Care Complications:            No immediate complications. Estimated Blood Loss:     Estimated blood loss: none. Procedure:                Pre-Anesthesia Assessment:                           - Prior to the procedure, a History and Physical                            was performed, and patient medications, allergies                            and sensitivities were reviewed. The patient's                            tolerance of previous anesthesia was reviewed.                           - The risks and benefits of the procedure and the                            sedation options and risks were discussed with the                            patient. All questions were answered and informed                            consent was obtained.                           - ASA Grade Assessment: III - A patient with severe                            systemic disease.                           After obtaining informed consent, the endoscope was                            passed under direct vision. Throughout the                            procedure, the patient's blood pressure, pulse,  and                            oxygen saturations were monitored continuously. The                            [id number]-H190 ([id number]) scope was introduced through the                            mouth, and advanced to the second part of duodenum.                            The upper GI endoscopy was accomplished without                            difficulty. The patient  tolerated the procedure                            well. Scope In: 3:05:21 PM Scope Out: 3:11:56 PM Total Procedure Duration: 0 hours 6 minutes 35 seconds  Findings:      Clotted blood was found in the entire esophagus. There was a large       adhered clot with a tubular shap, with base possibly adhered to the       lower esophagus but no lesion could be adequately visualized due to the       clot size. No acitve bleeding or large masses were visualized, although       visualization was limited by large clot. Query there is an underlying       ulcer at GE junction - Mack Guise tear?      Clots and old blood was found in the entire examined stomach. Upon       careful inspection, no presence of active bleeding was present.      The examined duodenum was normal. No hematin or old blood was seen. Impression:               - Large amopunt of clotted blood in the esophagus.                            Possible MW tear?                           - Clotted blood in the entire stomach.                           - Normal examined duodenum.                           - No specimens collected. Moderate Sedation:      Per Anesthesia Care Recommendation:           - Admit the patient to ICU.                           - NPO.                           - Check H/H  every 8 hours                           - PPI drip.                           - Standing Zofran 4 mg every 8 hours                           - Compazine PRN if recurrent nausea/vomiting.                           - Will need repeat EGD during this admission,                            possibly on Monday in case surgical/IR backup is                            needed. Procedure Code(s):        --- Professional ---                           [id number], Esophagogastroduodenoscopy, flexible,                            transoral; diagnostic, including collection of                            specimen(s) by brushing or washing, when performed                             (separate procedure) Diagnosis Code(s):        --- Professional ---                           K22.89, Other specified disease of esophagus                           K92.2, Gastrointestinal hemorrhage, unspecified                           K92.0, Hematemesis CPT copyright 2022 [employer] Association. All rights reserved. The codes documented in this report are preliminary and upon coder review may  be revised to meet current compliance requirements. [name], MD [name],  05/27/2022 3:28:10 PM This report has been signed electronically. Number of Addenda: 0

## 2022-05-27 NOTE — Consult Note (Signed)
Gastroenterology Consult   Referring Provider: No ref. provider found Primary Care Physician:  [name], MD Primary Gastroenterologist:  Dr. Jenetta Downer, Dr. [name] [facility])  Patient ID: [name]; [id number]; [birth year]   Admit date: 05/27/2022  LOS: 0 days   Date of Consultation: 05/27/2022  Reason for Consultation:  hematemesis  History of Present Illness   [name] is a 66 y.o. year old male with a history of DM, cardiomyopathy with systolic and diastolic CHF, CABG, ESRD on dialysis, stroke, HTN, diabetes, Barrett's esophagus, esophageal adenocarcinoma (daignosed in 2022, s/p EMR, cryotherapy, and RFA in April 2023, not candidate for esophagectomy). Follows with oncology for monitoring of esophageal adenocarcinoma as well as [facility]. Presented via EMS after hematemesis while at dialysis, only completed 30 minutes of therapy.   ED Course: Hgb 12.1, Cr 4.84, Calcium 8.6, K 4.5, Na 135.  Given zofran and PPI injection and compazine. Initially hypotensive with EMS, received 1L NS, VSS currently stable.     Consult: Seen by our team in September for dysphagia and emesis. Underwent EGD as outlined below. Treated with diflucan for candida esophagitis. No pathology taken.   PET scan 02/10/22 with no signs to suggest residual or recurrent FDG avid tumor within the esophagus or metastasis.   This morning started vomiting blood around 10:30. Did not have anything to eat this morning. Has had about a dozen episodes. Reports prior to his last scope at [facility] in July he did have some bloody emesis after that. Spent some time at [facility] for bleeding that time and received a few units of blood. Seen [facility] yesterday and was supposed to have EGD yesterday but had tachycardia (HR 165) and had low BP and was sent to the ED. Reports he was given nitro and did some blood work and had some cardiac enzymes checked multiple times and eventually discharged home.   Able to eat dinner yesterday  without any issue. Only takes a baby aspirin. Having a lot of reflux symptoms. Taking pantoprazole 40 gm BID. Avoiding NSAIDs. Denies alcohol use and tobacco use. Not having chest pain. Was nauseated prior to vomiting. Has bene dizzy and lightheaded. No melena or BRBPR, abdominal pain. Continues top have some issues with dysphagia.     EGD 03/08/22: -medium sized hiatal hernia -moderately severe candida esophagitis with no bleeding -normal stomach -normal duodenum - Advised mechanical soft diet, 2 week course of Diflucan   Past Medical History:  Diagnosis Date   Anemia    Arthritis    hips shoulders   CAD (coronary artery disease) 03/03/2019   Cancer ([place])    Esophageal cancer 2022   CHF (congestive heart failure) ([place]) 2020   Chronic combined systolic and diastolic heart failure ([place]) 04/26/2018   Admitted 11/14-11/20/19-diuresed 10L   Depression 05/31/2021   Fatigue 10/14/2020   Hypertension    Myocardial infarction [facility])    Sleep apnea    uses a bipap machine   Stroke ([place]) 12/14/2020   no last weakness or paralysis   Type 2 diabetes mellitus with diabetic nephropathy ([place]) 04/23/2019   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    Past Surgical History:  Procedure Laterality Date   AV FISTULA PLACEMENT Left 12/21/2021   Procedure: LEFT ARM ARTERIOVENOUS (AV) FISTULA CREATION;  Surgeon: Rosetta Posner, MD;  Location: [facility];  Service: Vascular;  Laterality: Left;   BIOPSY  06/27/2019   Procedure: BIOPSY;  Surgeon: Rogene Houston, MD;  [facility];  Service: Endoscopy;;  ascending colon polyp   BIOPSY  10/21/2020   Procedure: BIOPSY;  Surgeon: Rogene Houston, MD;  [facility];  Service: Endoscopy;;  esophageal,gastric polyp   BIOPSY  02/24/2021   Procedure: BIOPSY;  Surgeon: Rogene Houston, MD;  [facility];  Service: Endoscopy;;  distal and proximal esophageal biopsies    CARDIAC SURGERY     CATARACT EXTRACTION     COLONOSCOPY N/A  06/27/2019   [name]:Diverticulosis in the entire examined colon. tubular adenoma in ascending colon, 73m tubular adenoma in prox sigmoid. external hemorrhoids   CORONARY ARTERY BYPASS GRAFT N/A 03/25/2019   Procedure: CORONARY ARTERY BYPASS GRAFTING (CABG) X  4 USING LEFT INTERNAL MAMMARY ARTERY AND RIGHT SAPHENOUS VEIN GRAFTS;  Surgeon: LLajuana Matte MD;  [place] [facility] [place]  Service: Open Heart Surgery;  Laterality: N/A;   ESOPHAGEAL BRUSHING  03/08/2022   Procedure: ESOPHAGEAL BRUSHING;  Surgeon: CEloise Harman DO;  [facility];  Service: Endoscopy;;   ESOPHAGOGASTRODUODENOSCOPY (EGD) WITH PROPOFOL N/A 10/21/2020   rehman:Normal hypopharynx.Normal proximal esophagus and mid esophagus.Esophageal mucosal changes consistent with long-segment Barrett's esophagus. (Focal low-grade dysplasia and atypia proximally) z line irregular 37 cm from incisors, 3 cm HH, single gastric polyp (fundic gland) normal duodenal bulb/second portion of duodenum, proximal margin of Barrett's at 34 cm   ESOPHAGOGASTRODUODENOSCOPY (EGD) WITH PROPOFOL N/A 02/24/2021   Procedure: ESOPHAGOGASTRODUODENOSCOPY (EGD) WITH PROPOFOL;  Surgeon: RRogene Houston MD;  [facility];  Service: Endoscopy;  Laterality: N/A;  1:35   ESOPHAGOGASTRODUODENOSCOPY (EGD) WITH PROPOFOL N/A 03/21/2021   Procedure: ESOPHAGOGASTRODUODENOSCOPY (EGD) WITH PROPOFOL;  Surgeon: HCarol Ada MD;  [place] [facility]  Service: Endoscopy;  Laterality: N/A;   ESOPHAGOGASTRODUODENOSCOPY (EGD) WITH PROPOFOL N/A 10/23/2021   Procedure: ESOPHAGOGASTRODUODENOSCOPY (EGD) WITH PROPOFOL;  Surgeon: DDoran Stabler MD;  [place] [facility]  Service: Gastroenterology;  Laterality: N/A;   ESOPHAGOGASTRODUODENOSCOPY (EGD) WITH PROPOFOL N/A 03/08/2022   Procedure: ESOPHAGOGASTRODUODENOSCOPY (EGD) WITH PROPOFOL;  Surgeon: CEloise Harman DO;  [facility];  Service: Endoscopy;  Laterality: N/A;   EXCISION MORTON'S  NEUROMA     EYE SURGERY Left    retina   INCISION AND DRAINAGE OF WOUND Right 06/28/2021   Procedure: IRRIGATION AND DEBRIDEMENT WOUND;  Surgeon: PCindra Presume MD;  [place]  Service: Plastics;  Laterality: Right;  1.5 hour   INSERTION OF DIALYSIS CATHETER Right 12/21/2021   Procedure: INSERTION OF TUNNELED DIALYSIS CATHETER;  Surgeon: ERosetta Posner MD;  [facility];  Service: Vascular;  Laterality: Right;   LEFT HEART CATH AND CORS/GRAFTS ANGIOGRAPHY N/A 02/01/2022   Procedure: LEFT HEART CATH AND CORS/GRAFTS ANGIOGRAPHY;  Surgeon: JMartinique Peter M, MD;  [facility];  Service: Cardiovascular;  Laterality: N/A;   POLYPECTOMY  06/27/2019   Procedure: POLYPECTOMY;  Surgeon: RRogene Houston MD;  [facility];  Service: Endoscopy;;  proximal sigmoid colon   RIGHT/LEFT HEART CATH AND CORONARY ANGIOGRAPHY N/A 03/12/2019   Procedure: RIGHT/LEFT HEART CATH AND CORONARY ANGIOGRAPHY;  Surgeon: VJettie Booze MD;  [facility];  Service: Cardiovascular;  Laterality: N/A;   SHOULDER ARTHROSCOPY WITH ROTATOR CUFF REPAIR AND SUBACROMIAL DECOMPRESSION Right 08/26/2020   Procedure: RIGHT SHOULDER MINI OPEN ROTATOR CUFF REPAIR AND SUBACROMIAL DECOMPRESSION WITH PATCH GRAFT;  Surgeon: BSusa Day MD;  [facility];  Service: Orthopedics;  Laterality: Right;  90 MINS GENERAL WITH BLOCK   SKIN SPLIT GRAFT Right  06/28/2021   Procedure: SKIN GRAFT SPLIT THICKNESS;  Surgeon: [name], MD;  Location: [place];  Service: Plastics;  Laterality: Right;   TEE WITHOUT CARDIOVERSION N/A 03/25/2019   Procedure: TRANSESOPHAGEAL ECHOCARDIOGRAM (TEE);  Surgeon: [name], MD;  Location: [place];  Service: Open Heart Surgery;  Laterality: N/A;    Prior to Admission medications   Medication Sig Start Date End Date Taking? Authorizing Provider  acetaminophen (TYLENOL) 325 MG tablet Take 2 tablets (650 mg total) by mouth every 6 (six) hours as  needed for mild pain, moderate pain or fever. 08/02/21   Allie Bossier, MD  albuterol (VENTOLIN HFA) 108 (90 Base) MCG/ACT inhaler Inhale 2 puffs into the lungs every 6 (six) hours as needed. Patient taking differently: Inhale 2 puffs into the lungs every 6 (six) hours as needed for wheezing or shortness of breath. 06/24/21   Coral Spikes, DO  amiodarone (PACERONE) 200 MG tablet Take 1 tablet (200 mg total) by mouth daily. 200 mg once daily 05/05/22   Johnson, Clanford L, MD  ARIPiprazole (ABILIFY) 5 MG tablet Take 5 mg by mouth daily.    [provider]  ascorbic acid (VITAMIN C) 500 MG tablet Take 1 tablet (500 mg total) by mouth daily. 08/03/21   Allie Bossier, MD  aspirin EC 325 MG tablet Take 325 mg by mouth daily. 02/21/22   [provider]  calcium acetate (PHOSLO) 667 MG capsule Take 667 mg by mouth in the morning, at noon, and at bedtime.    [provider]  calcium-vitamin D (OSCAL WITH D) 500-5 MG-MCG tablet Take 1 tablet by mouth 2 (two) times daily.    [provider]  doxazosin (CARDURA) 2 MG tablet Take 1 tablet (2 mg total) by mouth daily. 03/15/22   [name], MD  EPINEPHrine 0.3 mg/0.3 mL IJ SOAJ injection Inject 0.3 mg into the muscle as needed for anaphylaxis. 01/25/21   [name], MD  hydrALAZINE (APRESOLINE) 50 MG tablet TAKE 1 TABLET BY MOUTH EVERY 8 HOURS 04/13/22   [name], [name], MD  isosorbide dinitrate (ISORDIL) 30 MG tablet Take 30 mg by mouth 3 (three) times daily. 02/21/22   [provider]  lidocaine (ASPERCREME LIDOCAINE) 4 % Place 1 patch onto the skin daily. Wear for 12 hours then remove, wait 12 hours before applying a new patch to lower back    [provider]  metoprolol succinate (TOPROL-XL) 50 MG 24 hr tablet Take 1 tablet (50 mg total) by mouth daily. Take with or immediately following a meal. 03/15/22   [name], [name], MD  Multiple Vitamins-Minerals [facility] COMPLETE) TABS Take 1 tablet by mouth  daily.    [provider]  mupirocin ointment (BACTROBAN) 2 % Apply thin amount twice daily to open wounds on the legs 05/12/22   [name], [name], MD  nitroGLYCERIN (NITROSTAT) 0.4 MG SL tablet Place 1 tablet (0.4 mg total) under the tongue every 5 (five) minutes as needed for chest pain. 09/14/21   Loel Dubonnet, NP  Nutritional Supplements (FEEDING SUPPLEMENT, NEPRO CARB STEADY,) LIQD Take 237 mLs by mouth 2 (two) times daily with a meal.    [provider]  ondansetron (ZOFRAN-ODT) 4 MG disintegrating tablet TAKE 1 TABLET BY MOUTH EVERY 8 HOURS AS NEEDED FOR NAUSEA AND VOMITING 05/12/22   [name], MD  pantoprazole (PROTONIX) 40 MG tablet TAKE 1 TABLET BY MOUTH TWICE DAILY 04/13/22   [name], [name],  MD  REPATHA SURECLICK 354 MG/ML SOAJ Inject 140 mg as directed every 14 (fourteen) days. Patient taking differently: Inject 140 mg as directed every 14 (fourteen) days. Starting 02/08/22 09/14/21   Loel Dubonnet, NP  sertraline (ZOLOFT) 100 MG tablet 1 daily Patient taking differently: Take 100 mg by mouth daily. 02/11/22   [name], MD    Current Facility-Administered Medications  Medication Dose Route Frequency Provider Last Rate Last Admin   prochlorperazine (COMPAZINE) injection 10 mg  10 mg Intravenous Once Milton [name], MD       sodium chloride 0.9 % bolus 500 mL  500 mL Intravenous Once Milton [name], MD       Current Outpatient Medications  Medication Sig Dispense Refill   acetaminophen (TYLENOL) 325 MG tablet Take 2 tablets (650 mg total) by mouth every 6 (six) hours as needed for mild pain, moderate pain or fever. 30 tablet 0   albuterol (VENTOLIN HFA) 108 (90 Base) MCG/ACT inhaler Inhale 2 puffs into the lungs every 6 (six) hours as needed. (Patient taking differently: Inhale 2 puffs into the lungs every 6 (six) hours as needed for wheezing or shortness of breath.) 18 g 5   amiodarone (PACERONE) 200 MG tablet Take 1 tablet (200 mg total) by mouth  daily. 200 mg once daily 30 tablet 1   ARIPiprazole (ABILIFY) 5 MG tablet Take 5 mg by mouth daily.     ascorbic acid (VITAMIN C) 500 MG tablet Take 1 tablet (500 mg total) by mouth daily. 30 tablet 0   aspirin EC 325 MG tablet Take 325 mg by mouth daily.     calcium acetate (PHOSLO) 667 MG capsule Take 667 mg by mouth in the morning, at noon, and at bedtime.     calcium-vitamin D (OSCAL WITH D) 500-5 MG-MCG tablet Take 1 tablet by mouth 2 (two) times daily.     doxazosin (CARDURA) 2 MG tablet Take 1 tablet (2 mg total) by mouth daily. 30 tablet 5   EPINEPHrine 0.3 mg/0.3 mL IJ SOAJ injection Inject 0.3 mg into the muscle as needed for anaphylaxis. 1 each 4   hydrALAZINE (APRESOLINE) 50 MG tablet TAKE 1 TABLET BY MOUTH EVERY 8 HOURS 90 tablet 1   isosorbide dinitrate (ISORDIL) 30 MG tablet Take 30 mg by mouth 3 (three) times daily.     lidocaine (ASPERCREME LIDOCAINE) 4 % Place 1 patch onto the skin daily. Wear for 12 hours then remove, wait 12 hours before applying a new patch to lower back     metoprolol succinate (TOPROL-XL) 50 MG 24 hr tablet Take 1 tablet (50 mg total) by mouth daily. Take with or immediately following a meal. 30 tablet 3   Multiple Vitamins-Minerals (THERATRUM COMPLETE) TABS Take 1 tablet by mouth daily.     mupirocin ointment (BACTROBAN) 2 % Apply thin amount twice daily to open wounds on the legs 22 g 3   nitroGLYCERIN (NITROSTAT) 0.4 MG SL tablet Place 1 tablet (0.4 mg total) under the tongue every 5 (five) minutes as needed for chest pain. 25 tablet 2   Nutritional Supplements (FEEDING SUPPLEMENT, NEPRO CARB STEADY,) LIQD Take 237 mLs by mouth 2 (two) times daily with a meal.     ondansetron (ZOFRAN-ODT) 4 MG disintegrating tablet TAKE 1 TABLET BY MOUTH EVERY 8 HOURS AS NEEDED FOR NAUSEA AND VOMITING 30 tablet 1   pantoprazole (PROTONIX) 40 MG tablet TAKE 1 TABLET BY MOUTH TWICE DAILY 60 tablet 1   REPATHA SURECLICK 656 MG/ML  SOAJ Inject 140 mg as directed every 14  (fourteen) days. (Patient taking differently: Inject 140 mg as directed every 14 (fourteen) days. Starting 02/08/22) 2 mL 11   sertraline (ZOLOFT) 100 MG tablet 1 daily (Patient taking differently: Take 100 mg by mouth daily.) 30 tablet 5    Allergies as of 05/27/2022 - Review Complete 05/27/2022  Allergen Reaction Noted   Bee venom Anaphylaxis 07/29/2015   Atorvastatin Other (See Comments) 02/20/2020   Diltiazem Itching 02/05/2021   Reglan [metoclopramide] Other (See Comments) 09/16/2021   Rosuvastatin Other (See Comments) 05/05/2020   Valsartan Itching 02/18/2021    Family History  Problem Relation Age of Onset   Colon cancer Mother    Heart Problems Father    Diabetes Father    Valvular heart disease Father    Sleep apnea Neg Hx    Stroke Neg Hx     Social History   Socioeconomic History   Marital status: Single    Spouse name: Not on file   Number of children: Not on file   Years of education: 12   Highest education level: 12th grade  Occupational History   Not on file  Tobacco Use   Smoking status: Former    Types: Cigarettes    Quit date: 07/06/1988    Years since quitting: 33.9    Passive exposure: Past   Smokeless tobacco: Never  Vaping Use   Vaping Use: Never used  Substance and Sexual Activity   Alcohol use: Not Currently    Alcohol/week: 2.0 standard drinks of alcohol    Types: 2 Glasses of wine per week    Comment: socially   Drug use: No   Sexual activity: Not Currently    Partners: Female  Other Topics Concern   Not on file  Social History Narrative   Not on file   Social Determinants of Health   Financial Resource Strain: Low Risk  (04/21/2022)   Overall Financial Resource Strain (CARDIA)    Difficulty of Paying Living Expenses: Not hard at all  Food Insecurity: No Food Insecurity (04/21/2022)   Hunger Vital Sign    Worried About Running Out of Food in the Last Year: Never true    Ran Out of Food in the Last Year: Never true  Transportation  Needs: No Transportation Needs (04/21/2022)   PRAPARE - Hydrologist (Medical): No    Lack of Transportation (Non-Medical): No  Physical Activity: Inactive (04/21/2022)   Exercise Vital Sign    Days of Exercise per Week: 0 days    Minutes of Exercise per Session: 0 min  Stress: No Stress Concern Present (04/21/2022)   [place]    Feeling of Stress : Only a little  Social Connections: Unknown (04/21/2022)   Social Connection and Isolation Panel [NHANES]    Frequency of Communication with Friends and Family: More than three times a week    Frequency of Social Gatherings with Friends and Family: More than three times a week    Attends Religious Services: More than 4 times per year    Active Member of [employer] or Organizations: Yes    Attends Archivist Meetings: More than 4 times per year    Marital Status: Not on file  Intimate Partner Violence: Not At Risk (04/21/2022)   Humiliation, Afraid, Rape, and Kick questionnaire    Fear of Current or Ex-Partner: No    Emotionally Abused: No  Physically Abused: No    Sexually Abused: No     Review of Systems   Gen: Denies any fever, chills, loss of appetite, change in weight or weight loss CV: Denies chest pain, heart palpitations, syncope, edema  Resp: Denies shortness of breath with rest, cough, wheezing, coughing up blood, and pleurisy. GI: see HPI GU : Denies urinary burning, blood in urine, urinary frequency, and urinary incontinence. MS: Denies joint pain, limitation of movement, swelling, cramps, and atrophy.  Derm: Denies rash, itching, dry skin, hives. Psych: Denies depression, anxiety, memory loss, hallucinations, and confusion. Heme: Denies bruising or bleeding Neuro:  Denies any headaches, dizziness, paresthesias, shaking  Physical Exam   Vital Signs in last 24 hours: Temp:  [98.7 F (37.1 C)] 98.7 F (37.1 C) (12/15  1132) Pulse Rate:  [86] 86 (12/15 1132) Resp:  [15] 15 (12/15 1132) BP: (140)/(68) 140/68 (12/15 1132) SpO2:  [97 %] 97 % (12/15 1132) Weight:  [86.2 kg] 86.2 kg (12/15 1130)    General:   Alert,  Well-developed, well-nourished, pleasant and cooperative in NAD Head:  Normocephalic and atraumatic. Eyes:  Sclera clear, no icterus.   Conjunctiva pink. Ears:  Normal auditory acuity. Mouth:  No deformity or lesions, dentition normal. Presence of blood Lungs:  Clear throughout to auscultation.   No wheezes, crackles, or rhonchi. No acute distress. Heart:  Regular rate and rhythm; no murmurs, clicks, rubs,  or gallops. Abdomen:  Soft, nontender and nondistended. No masses, hepatosplenomegaly or hernias noted. Normal bowel sounds, without guarding, and without rebound.   Rectal: deferred   Extremities:  Without clubbing or edema. Neurologic:  Alert and  oriented x4. Skin:  Intact without significant lesions or rashes. Psych:  Alert and cooperative. Normal mood and affect.  Intake/Output from previous day: No intake/output data recorded. Intake/Output this shift: No intake/output data recorded.   Labs/Studies   Recent Labs Recent Labs    05/27/22 1138 05/27/22 1200 05/27/22 1254  WBC 8.6  --   --   HGB 12.1* 13.3 13.3  HCT 39.9 39.0 39.0  PLT 244  --   --    BMET Recent Labs    05/27/22 1138 05/27/22 1200 05/27/22 1254  NA 135 133* 133*  K 4.5 4.8 5.5*  CL 96* 99 98  CO2 24  --   --   GLUCOSE 231* 233* 230*  BUN 66* 69* 85*  CREATININE 4.84* 5.50* 5.50*  CALCIUM 8.6*  --   --    LFT Recent Labs    05/27/22 1138  PROT 7.0  ALBUMIN 3.7  AST 15  ALT 17  ALKPHOS 111  BILITOT 1.1   PT/INR No results for input(s): "LABPROT", "INR" in the last 72 hours. Hepatitis Panel No results for input(s): "HEPBSAG", "HCVAB", "HEPAIGM", "HEPBIGM" in the last 72 hours. C-Diff No results for input(s): "CDIFFTOX" in the last 72 hours.  Radiology/Studies No results  found.   Assessment   [name] is a 66 y.o. year old male with a history of DM, cardiomyopathy with systolic and diastolic CHF, CABG, ESRD on dialysis, stroke, HTN, diabetes, Barrett's esophagus, esophageal adenocarcinoma (daignosed in 2022, s/p EMR, cryotherapy, and RFA in April 2023, not candidate for esophagectomy). Follows with oncology for monitoring of esophageal adenocarcinoma as well as [facility]. Presented via EMS after hematemesis while at dialysis, only completed 30 minutes of therapy.  GI consulted for further evaluation.  Hematemesis: Presented with hematemesis from dialysis. Hgb stable on arrival. Has been fatigued. Was due  for EGD with [facility] yesterday that was aborted due to tachycardia. Has had about a dozen episodes of hematemesis since this morning and ongoing nausea. Will undergo EGD today with general anesthesia for further evaluation. Will give PPI bolus and infusion. Monitor H/H regularly.   Plan / Recommendations   Monitor H/H, transfuse for Hgb <7 NPO EGD today with Dr. Jenetta Downer, perform under general anesthesia.  Pantoprazole bolus followed by infusion.      05/27/2022, 1:07 PM  Venetia Night, MSN, FNP-BC, AGACNP-BC [facility]

## 2022-05-28 DIAGNOSIS — Z992 Dependence on renal dialysis: Secondary | ICD-10-CM | POA: Diagnosis not present

## 2022-05-28 DIAGNOSIS — I252 Old myocardial infarction: Secondary | ICD-10-CM | POA: Diagnosis not present

## 2022-05-28 DIAGNOSIS — K449 Diaphragmatic hernia without obstruction or gangrene: Secondary | ICD-10-CM | POA: Diagnosis present

## 2022-05-28 DIAGNOSIS — K317 Polyp of stomach and duodenum: Secondary | ICD-10-CM | POA: Diagnosis present

## 2022-05-28 DIAGNOSIS — I5042 Chronic combined systolic (congestive) and diastolic (congestive) heart failure: Secondary | ICD-10-CM | POA: Diagnosis present

## 2022-05-28 DIAGNOSIS — Z8249 Family history of ischemic heart disease and other diseases of the circulatory system: Secondary | ICD-10-CM | POA: Diagnosis not present

## 2022-05-28 DIAGNOSIS — Z87891 Personal history of nicotine dependence: Secondary | ICD-10-CM | POA: Diagnosis not present

## 2022-05-28 DIAGNOSIS — H5461 Unqualified visual loss, right eye, normal vision left eye: Secondary | ICD-10-CM | POA: Diagnosis present

## 2022-05-28 DIAGNOSIS — F32A Depression, unspecified: Secondary | ICD-10-CM | POA: Diagnosis present

## 2022-05-28 DIAGNOSIS — G4730 Sleep apnea, unspecified: Secondary | ICD-10-CM | POA: Diagnosis not present

## 2022-05-28 DIAGNOSIS — Z800 Family history of malignant neoplasm of digestive organs: Secondary | ICD-10-CM | POA: Diagnosis not present

## 2022-05-28 DIAGNOSIS — I2510 Atherosclerotic heart disease of native coronary artery without angina pectoris: Secondary | ICD-10-CM | POA: Diagnosis present

## 2022-05-28 DIAGNOSIS — D62 Acute posthemorrhagic anemia: Secondary | ICD-10-CM | POA: Diagnosis present

## 2022-05-28 DIAGNOSIS — D631 Anemia in chronic kidney disease: Secondary | ICD-10-CM | POA: Diagnosis present

## 2022-05-28 DIAGNOSIS — K2270 Barrett's esophagus without dysplasia: Secondary | ICD-10-CM | POA: Diagnosis present

## 2022-05-28 DIAGNOSIS — K922 Gastrointestinal hemorrhage, unspecified: Secondary | ICD-10-CM | POA: Diagnosis not present

## 2022-05-28 DIAGNOSIS — E1151 Type 2 diabetes mellitus with diabetic peripheral angiopathy without gangrene: Secondary | ICD-10-CM | POA: Diagnosis not present

## 2022-05-28 DIAGNOSIS — I132 Hypertensive heart and chronic kidney disease with heart failure and with stage 5 chronic kidney disease, or end stage renal disease: Secondary | ICD-10-CM | POA: Diagnosis present

## 2022-05-28 DIAGNOSIS — Z79899 Other long term (current) drug therapy: Secondary | ICD-10-CM | POA: Diagnosis not present

## 2022-05-28 DIAGNOSIS — K920 Hematemesis: Secondary | ICD-10-CM | POA: Diagnosis present

## 2022-05-28 DIAGNOSIS — K2951 Unspecified chronic gastritis with bleeding: Secondary | ICD-10-CM | POA: Diagnosis present

## 2022-05-28 DIAGNOSIS — K209 Esophagitis, unspecified without bleeding: Secondary | ICD-10-CM | POA: Diagnosis not present

## 2022-05-28 DIAGNOSIS — Z66 Do not resuscitate: Secondary | ICD-10-CM | POA: Diagnosis present

## 2022-05-28 DIAGNOSIS — C159 Malignant neoplasm of esophagus, unspecified: Secondary | ICD-10-CM | POA: Diagnosis present

## 2022-05-28 DIAGNOSIS — I429 Cardiomyopathy, unspecified: Secondary | ICD-10-CM | POA: Diagnosis present

## 2022-05-28 DIAGNOSIS — Z86010 Personal history of colonic polyps: Secondary | ICD-10-CM | POA: Diagnosis not present

## 2022-05-28 DIAGNOSIS — E1122 Type 2 diabetes mellitus with diabetic chronic kidney disease: Secondary | ICD-10-CM | POA: Diagnosis present

## 2022-05-28 DIAGNOSIS — Z951 Presence of aortocoronary bypass graft: Secondary | ICD-10-CM | POA: Diagnosis not present

## 2022-05-28 DIAGNOSIS — N186 End stage renal disease: Secondary | ICD-10-CM | POA: Diagnosis present

## 2022-05-28 LAB — HEMOGLOBIN AND HEMATOCRIT, BLOOD
HCT: 31.7 % — ABNORMAL LOW (ref 39.0–52.0)
HCT: 31.7 % — ABNORMAL LOW (ref 39.0–52.0)
Hemoglobin: 9.8 g/dL — ABNORMAL LOW (ref 13.0–17.0)
Hemoglobin: 9.7 g/dL — ABNORMAL LOW (ref 13.0–17.0)

## 2022-05-28 LAB — CBC
HCT: 33.8 % — ABNORMAL LOW (ref 39.0–52.0)
Hemoglobin: 10.4 g/dL — ABNORMAL LOW (ref 13.0–17.0)
MCH: 27.2 pg (ref 26.0–34.0)
MCHC: 30.8 g/dL (ref 30.0–36.0)
MCV: 88.3 fL (ref 80.0–100.0)
Platelets: 246 10*3/uL (ref 150–400)
RBC: 3.83 MIL/uL — ABNORMAL LOW (ref 4.22–5.81)
RDW: 18.4 % — ABNORMAL HIGH (ref 11.5–15.5)
WBC: 7.1 10*3/uL (ref 4.0–10.5)
nRBC: 0 % (ref 0.0–0.2)

## 2022-05-28 LAB — COMPREHENSIVE METABOLIC PANEL
ALT: 12 U/L (ref 0–44)
AST: 8 U/L — ABNORMAL LOW (ref 15–41)
Albumin: 3.1 g/dL — ABNORMAL LOW (ref 3.5–5.0)
Alkaline Phosphatase: 84 U/L (ref 38–126)
Anion gap: 11 (ref 5–15)
BUN: 74 mg/dL — ABNORMAL HIGH (ref 8–23)
CO2: 22 mmol/L (ref 22–32)
Calcium: 8.3 mg/dL — ABNORMAL LOW (ref 8.9–10.3)
Chloride: 105 mmol/L (ref 98–111)
Creatinine, Ser: 5.36 mg/dL — ABNORMAL HIGH (ref 0.61–1.24)
GFR, Estimated: 11 mL/min — ABNORMAL LOW (ref >60)
Glucose, Bld: 131 mg/dL — ABNORMAL HIGH (ref 70–99)
Potassium: 4.2 mmol/L (ref 3.5–5.1)
Sodium: 138 mmol/L (ref 135–145)
Total Bilirubin: 0.7 mg/dL (ref 0.3–1.2)
Total Protein: 6 g/dL — ABNORMAL LOW (ref 6.5–8.1)

## 2022-05-28 LAB — GLUCOSE, CAPILLARY
Glucose-Capillary: 116 mg/dL — ABNORMAL HIGH (ref 70–99)
Glucose-Capillary: 128 mg/dL — ABNORMAL HIGH (ref 70–99)
Glucose-Capillary: 129 mg/dL — ABNORMAL HIGH (ref 70–99)
Glucose-Capillary: 152 mg/dL — ABNORMAL HIGH (ref 70–99)
Glucose-Capillary: 177 mg/dL — ABNORMAL HIGH (ref 70–99)
Glucose-Capillary: 195 mg/dL — ABNORMAL HIGH (ref 70–99)
Glucose-Capillary: 99 mg/dL (ref 70–99)

## 2022-05-28 LAB — HEPATITIS B SURFACE ANTIGEN: Hepatitis B Surface Ag: NONREACTIVE

## 2022-05-28 LAB — MRSA NEXT GEN BY PCR, NASAL: MRSA by PCR Next Gen: NOT DETECTED

## 2022-05-28 MED ORDER — INSULIN ASPART 100 UNIT/ML IJ SOLN
0.0000 [IU] | INTRAMUSCULAR | Status: DC
  Administered 2022-05-28 – 2022-05-29 (×2): 1 [IU] via SUBCUTANEOUS
  Administered 2022-05-29 (×2): 2 [IU] via SUBCUTANEOUS
  Administered 2022-05-30 (×2): 1 [IU] via SUBCUTANEOUS

## 2022-05-28 NOTE — Progress Notes (Signed)
PROGRESS NOTE  [name] AQT:[id number] DOB: Jul 23, 1955 DOA: 05/27/2022 PCP: [name], MD  Brief History:  66 year old male with a history of coronary disease status post CABG, systolic and diastolic CHF, diabetes mellitus type 2, hypertension, ESRD, SVT, esophageal adenocarcinoma, and OSA presenting with hematemesis during his dialysis session earlier today on 05/27/2022.  The patient actually states that he had an episode of hematemesis on the evening of 05/26/2022.  The patient was recently admitted to the hospital from 05/03/2022 to 05/05/2022 secondary to Enterobacter cloacae bacteremia.  He was treated with cefepime on dialysis.  He was also fluid overloaded during that admission.  He was discharged in stable condition to complete a course of cefepime. His dialysis catheter was removed on 05/24/2022 at [facility], and a new dialysis catheter was placed on the same day. He was at [facility] to have his usual surveillance EGD on 05/26/2022, but ended up having SVT and hypotension.  He was transferred to the ED.  He stabilized and was discharged home.  Notably, the patient was recently admitted to the hospital The patient had been in his usual state of health until yesterday, 05/26/2022.  Apparently he was going for an EGD, but he was noted to have SVT with heart rate in the 160s.  It was canceled.  The patient denies any NSAIDs. He only completed 30 minutes of his dialysis on 05/27/2022. Patient denies fevers, chills, headache, chest pain, dyspnea, nausea, vomiting, diarrhea, abdominal pain, dysuria, hematuria, hematochezia, and melena. In the ED, the patient was afebrile hemodynamically stable with oxygen saturation 100% room air.  WBC 8.6, hemoglobin 12.1, platelets 244,000.  Sodium 135, potassium 4.5, bicarbonate 24, serum creatinine 4.4.  LFTs were unremarkable.  CT of the abdomen and chest did not show any metastasis.  There is no obvious mass in the  esophagus.  There is interval improvement of aeration in the right lung with persistent right pleural effusion.  There is no bowel wall thickening.  Left lung is clear.  GI was consulted to assist with management.   Assessment/Plan: Hematemesis/Acute Blood Loss Anemia -GI consulted -continue pantoprazole drip -12/15 EGD clotted blood in the entire esophagus, clots and old blood in the stomach.  No active bleeding noted. -Hgb 13.3>>10.1   Chronic combined systolic and diastolic CHF -Clinically euvolemic -07/25/21 Echo--EF 40-45%, no WMA, mod RV dysfx, PASP 56.5  -03/01/22 Echo--EF 40-45%, global HK, RV overload  -Fluid removal via dialysis   ESRD -He dialyzes on Monday, Wednesday, Friday -Consult nephrology for maintenance dialysis -He was started on dialysis sometime in August 2023 -Nephrology consulted for maintenance dialysis   Right pleural effusion -This has been chronic -He has had previous thoracocentesis in the past, last done 03/01/2022 -stable on RA   S/P CABG (coronary artery bypass graft) Recent cardiac cath 01/2021, showed patent grafts, severe three-vessel native CAD.Marland Kitchen  History of CABG 2020. -No chest pain presently   SVT history -Continue amiodarone 200 mg daily   Essential hypertension -Restart metoprolol -Holding hydralazine and doxazosin for soft blood pressures   Esophageal adenocarcinoma (HCC) Follows GI at [facility], Dr. [name] --considered poor candidate for esophagectomy. After multidisciplinary discussion the plan was to continue with endoscopic treatments, and continue to follow-up with oncology for surveillance and any signs of progression of disease -status post EMR, cryotherapy, and most recently RFA in April 2023 -02/10/22 PET scan--no local recurrence  Family Communication:  significant other updated 12/16  Consultants:  GI  Code Status:  DNR  DVT Prophylaxis:  SCDs   Procedures: As Listed in Progress Note  Above  Antibiotics: None        Subjective: He still has small amounts of hematemesis.  He denies fevers, chills, chest pain, shortness of breath, diarrhea, abdominal pain.  Objective: Vitals:   05/28/22 1000 05/28/22 1100 05/28/22 1136 05/28/22 1200  BP: 123/66 134/61  (!) 119/54  Pulse: 80 78  75  Resp: '15 13  16  '$ Temp:   97.9 F (36.6 C)   TempSrc:   Oral   SpO2: 96% 95%  98%  Weight:      Height:        Intake/Output Summary (Last 24 hours) at 05/28/2022 1321 Last data filed at 05/28/2022 1014 Gross per 24 hour  Intake 227.84 ml  Output 250 ml  Net -22.16 ml   Weight change:  Exam:  General:  Pt is alert, follows commands appropriately, not in acute distress HEENT: No icterus, No thrush, No neck mass, [place]/AT Cardiovascular: RRR, S1/S2, no rubs, no gallops Respiratory: CTA bilaterally, no wheezing, no crackles, no rhonchi Abdomen: Soft/+BS, non tender, non distended, no guarding Extremities: No edema, No lymphangitis, No petechiae, No rashes, no synovitis   Data Reviewed: I have personally reviewed following labs and imaging studies Basic Metabolic Panel: Recent Labs  Lab 05/27/22 1138 05/27/22 1200 05/27/22 1254 05/28/22 0354  NA 135 133* 133* 138  K 4.5 4.8 5.5* 4.2  CL 96* 99 98 105  CO2 24  --   --  22  GLUCOSE 231* 233* 230* 131*  BUN 66* 69* 85* 74*  CREATININE 4.84* 5.50* 5.50* 5.36*  CALCIUM 8.6*  --   --  8.3*   Liver Function Tests: Recent Labs  Lab 05/27/22 1138 05/28/22 0354  AST 15 8*  ALT 17 12  ALKPHOS 111 84  BILITOT 1.1 0.7  PROT 7.0 6.0*  ALBUMIN 3.7 3.1*   No results for input(s): "LIPASE", "AMYLASE" in the last 168 hours. No results for input(s): "AMMONIA" in the last 168 hours. Coagulation Profile: No results for input(s): "INR", "PROTIME" in the last 168 hours. CBC: Recent Labs  Lab 05/27/22 1138 05/27/22 1200 05/27/22 1254 05/27/22 2101 05/28/22 0354  WBC 8.6  --   --   --  7.1  NEUTROABS 6.9  --   --    --   --   HGB 12.1* 13.3 13.3 10.1* 10.4*  HCT 39.9 39.0 39.0 32.4* 33.8*  MCV 88.3  --   --   --  88.3  PLT 244  --   --   --  246   Cardiac Enzymes: No results for input(s): "CKTOTAL", "CKMB", "CKMBINDEX", "TROPONINI" in the last 168 hours. BNP: Invalid input(s): "POCBNP" CBG: Recent Labs  Lab 05/27/22 2012 05/28/22 0049 05/28/22 0342 05/28/22 0758 05/28/22 1134  GLUCAP 125* 129* 116* 128* 152*   HbA1C: No results for input(s): "HGBA1C" in the last 72 hours. Urine analysis:    Component Value Date/Time   COLORURINE YELLOW 02/28/2022 1414   APPEARANCEUR CLEAR 02/28/2022 1414   LABSPEC 1.014 02/28/2022 1414   PHURINE 5.0 02/28/2022 1414   GLUCOSEU >=500 (A) 02/28/2022 1414   HGBUR NEGATIVE 02/28/2022 1414   BILIRUBINUR NEGATIVE 02/28/2022 1414   KETONESUR NEGATIVE 02/28/2022 1414   PROTEINUR >=300 (A) 02/28/2022 1414   NITRITE NEGATIVE 02/28/2022 1414   LEUKOCYTESUR NEGATIVE 02/28/2022 1414   Sepsis Labs: @  LABRCNTIP(procalcitonin:4,lacticidven:4) )No results found for this or any previous visit (from the past 240 hour(s)).   Scheduled Meds:  amiodarone  200 mg Oral Daily   ARIPiprazole  5 mg Oral Daily   calcium acetate  667 mg Oral TID WC   Chlorhexidine Gluconate Cloth  6 each Topical Q0600   insulin aspart  0-6 Units Subcutaneous Q4H   isosorbide dinitrate  30 mg Oral TID   metoprolol succinate  50 mg Oral Daily   ondansetron (ZOFRAN) IV  4 mg Intravenous Q8H   [START ON 05/31/2022] pantoprazole  40 mg Intravenous Q12H   sertraline  100 mg Oral Daily   Continuous Infusions:  pantoprazole 8 mg/hr (05/28/22 1236)    Procedures/Studies: CT CHEST ABDOMEN PELVIS WO CONTRAST  Result Date: 05/21/2022 CLINICAL DATA:  Malignant neoplasm of stomach; adenocarcinoma of esophagus. * Tracking Code: BO * EXAM: CT CHEST, ABDOMEN AND PELVIS WITHOUT CONTRAST TECHNIQUE: Multidetector CT imaging of the chest, abdomen and pelvis was performed following the standard protocol  without IV contrast. RADIATION DOSE REDUCTION: This exam was performed according to the departmental dose-optimization program which includes automated exposure control, adjustment of the mA and/or kV according to patient size and/or use of iterative reconstruction technique. COMPARISON:  Chest CTA 05/03/2022.  Abdominopelvic CT 03/03/2022. FINDINGS: CT CHEST FINDINGS Cardiovascular: Atherosclerosis of the aorta, great vessels and coronary arteries status post median sternotomy and CABG. A right IJ central venous catheter projects to the upper right atrium, unchanged. Stable mild cardiomegaly. No significant pericardial fluid. Mediastinum/Nodes: There are no enlarged mediastinal, hilar or axillary lymph nodes. Hilar assessment is limited by the lack of intravenous contrast, although the hilar contours appear unchanged. The thyroid gland, trachea and esophagus demonstrate no significant findings. Lungs/Pleura: Moderate-sized dependent right pleural effusion is similar to the recent prior examination. Interval improved aeration of the right lower lobe with residual atelectasis. The aeration of the right upper and middle lobes has also improved. The left lung is clear. No suspicious pulmonary nodules. Musculoskeletal/Chest wall: No chest wall mass or suspicious osseous findings. Several nonacute right-sided rib fractures appear unchanged. CT ABDOMEN AND PELVIS FINDINGS Hepatobiliary: No focal hepatic abnormalities on noncontrast imaging. No evidence of gallstones, gallbladder wall thickening or biliary dilatation. Pancreas: Unremarkable. No pancreatic ductal dilatation or surrounding inflammatory changes. Spleen: Normal in size without focal abnormality. Adrenals/Urinary Tract: Both adrenal glands appear normal. No evidence of urinary tract calculus, suspicious renal lesion or hydronephrosis. Unchanged same nephric soft tissue stranding bilaterally and low-density renal lesions consistent with cysts; no follow-up  imaging recommended. The bladder appears unremarkable for its degree of distention. Stomach/Bowel: No enteric contrast administered. No obvious mass or concerning thickening of the distal esophagus or stomach identified. No bowel wall thickening, distention or surrounding inflammation. The appendix appears normal. There is diverticulosis of the descending and sigmoid colon. Vascular/Lymphatic: There are no enlarged abdominal or pelvic lymph nodes. Aortic and branch vessel atherosclerosis without evidence of aneurysm. Reproductive: Stable mild enlargement of the prostate gland. Other: Small periumbilical hernia containing only fat. No ascites, peritoneal nodularity or free air. Musculoskeletal: No acute or significant osseous findings. Mild lumbar spondylosis. IMPRESSION: 1. No evidence of metastatic disease within the chest, abdomen or pelvis on noncontrast imaging. No obvious lesion of the esophagus or stomach on noncontrast imaging. 2. Interval improved aeration of the right lung with persistent moderate-sized right pleural effusion. 3. Distal colonic diverticulosis without acute inflammation. 4.  Aortic Atherosclerosis (ICD10-I70.0). Electronically Signed   By: Richardean Sale M.D.   On:  05/21/2022 14:26   CT Angio Chest PE W and/or Wo Contrast  Result Date: 05/03/2022 CLINICAL DATA:  Pulmonary embolism (PE) suspected, low to intermediate prob, positive D-dimer Pt arrived via [facility] c/o 3 hrs and 5 mins of dialysis treatment when he had a sudden onset of Nausea and hypertension. EXAM: CT ANGIOGRAPHY CHEST WITH CONTRAST TECHNIQUE: Multidetector CT imaging of the chest was performed using the standard protocol during bolus administration of intravenous contrast. Multiplanar CT image reconstructions and MIPs were obtained to evaluate the vascular anatomy. RADIATION DOSE REDUCTION: This exam was performed according to the departmental dose-optimization program which includes automated exposure control, adjustment  of the mA and/or kV according to patient size and/or use of iterative reconstruction technique. CONTRAST:  56m OMNIPAQUE IOHEXOL 350 MG/ML SOLN COMPARISON:  Chest XR, concurrent.  CTA chest, 02/28/2022. FINDINGS: Suboptimal evaluation, secondary to motion degradation. Cardiovascular: Satisfactory opacification of the pulmonary arteries to the segmental level. No segmental or larger pulmonary embolus. Dilated main PA, measuring up to 4.2 cm. Multi chamber cardiac enlargement. No pericardial effusion. 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 RIGHT jugular approach dialysis catheter, well-positioned with tip within the proximal RIGHT atrium. Mediastinum/Nodes: No enlarged mediastinal, hilar, or axillary lymph nodes. Thyroid gland, trachea, and esophagus demonstrate no significant findings. Lungs/Pleura: Small volume RIGHT pleural effusion with adjacent dependent atelectasis. Mild bilateral pulmonary interstitial thickening with trace ground-glass opacities. No suspicious pulmonary nodule or mass. No pneumothorax. Upper Abdomen: No acute abnormality. Musculoskeletal: Postsurgical changes of median sternotomy. No acute chest wall abnormality. No significant osseous findings. Review of the MIP images confirms the above findings. IMPRESSION: Suboptimal evaluation, within these constraints; 1.  No segmental or larger pulmonary embolus. 2. Main pulmonary artery dilation, likely reflecting underlying pulmonary arterial hypertension. 3. Cardiomegaly, small volume RIGHT pleural effusion and interstitial thickening. Findings suspicious for early/developing pulmonary edema. Additional incidental, chronic and senescent findings as above. Electronically Signed   By: JMichaelle BirksM.D.   On: 05/03/2022 17:06   DG Chest Port 1 View  Result Date: 05/03/2022 CLINICAL DATA:  Shortness of breath, onset of nausea and hypertension during dialysis treatment EXAM: PORTABLE CHEST 1 VIEW  COMPARISON:  Portable exam 1420 hours compared to 03/03/2022 FINDINGS: RIGHT jugular central venous catheter with tip projecting over RIGHT atrium. Enlargement of cardiac silhouette with slight vascular congestion post CABG. RIGHT pleural effusion and mild RIGHT basilar atelectasis seen. Accentuation of interstitial markings similar to prior exam. Linear atelectasis RIGHT upper lobe unchanged. No pneumothorax or acute osseous findings. IMPRESSION: Enlargement of cardiac silhouette with vascular congestion. Small RIGHT pleural effusion and basilar atelectasis with persistent accentuation of interstitial markings and linear atelectasis in RIGHT upper lobe. Electronically Signed   By: MLavonia DanaM.D.   On: 05/03/2022 14:34    DOrson Eva DO  [facility]  If 7PM-7AM, please contact night-coverage [email or web address] Password [email or web address] 05/28/2022, 1:21 PM   LOS: 0 days

## 2022-05-28 NOTE — Procedures (Signed)
  HEMODIALYSIS TREATMENT NOTE:   Uneventful 3.5 hour heparin-free treatment completed.  Goal met: 2 liters removed without interruption in UF. All blood was returned.  No changes from pre-HD assessment.   [place]well [place], RN AP KDU

## 2022-05-28 NOTE — Progress Notes (Signed)
[facility] Brief Progress Note  Patient admitted from ED yesterday for eval of UGIB starting at out HD at [facility].  Underwent urgent EGD with findings of large amount of blood in esophagus and entire stomach.    Now in ICU Hb 10.4 this AM,  was 13.3 at presentation.  BPs are s table, HR 70s.  On [place] 3L.  GI following  Rec 9mn of HD yesterday.  HD Rx: 4h, AVF, 300/600, 2K, Also has TDC  EDW 83kg. Left HD yesterday at 84.9kg  Plan for HD today: 2K, 3.5h, no heparin, 1-2L UF max.
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
## 2022-05-28 NOTE — Progress Notes (Signed)
[name], M.D. Gastroenterology & Hepatology   Interval History:  No acute events overnight. Patient has not presented any more emesis episodes.  Denies any abdominal pain, nausea, fever, chills, lightheadedness or dizziness.  States he is feeling more awake than yesterday. Hemoglobin today went down to 10.4.  Inpatient Medications:  Current Facility-Administered Medications:    acetaminophen (TYLENOL) tablet 650 mg, 650 mg, Oral, Q6H PRN **OR** acetaminophen (TYLENOL) suppository 650 mg, 650 mg, Rectal, Q6H PRN, Tat, David, MD   amiodarone (PACERONE) tablet 200 mg, 200 mg, Oral, Daily, Tat, David, MD, 200 mg at 05/28/22 0913   ARIPiprazole (ABILIFY) tablet 5 mg, 5 mg, Oral, Daily, Tat, David, MD, 5 mg at 05/28/22 0913   calcium acetate (PHOSLO) capsule 667 mg, 667 mg, Oral, TID WC, Tat, Shanon Brow, MD   Chlorhexidine Gluconate Cloth 2 % PADS 6 each, 6 each, Topical, Q0600, Tat, Shanon Brow, MD, 6 each at 05/28/22 0504   doxazosin (CARDURA) tablet 2 mg, 2 mg, Oral, Daily, Tat, David, MD, 2 mg at 05/28/22 0913   insulin aspart (novoLOG) injection 0-6 Units, 0-6 Units, Subcutaneous, Q4H, Tat, David, MD   isosorbide dinitrate (ISORDIL) tablet 30 mg, 30 mg, Oral, TID, Tat, David, MD, 30 mg at 05/28/22 0913   metoprolol succinate (TOPROL-XL) 24 hr tablet 50 mg, 50 mg, Oral, Daily, Tat, David, MD, 50 mg at 05/28/22 0913   ondansetron (ZOFRAN) tablet 4 mg, 4 mg, Oral, Q6H PRN **OR** ondansetron (ZOFRAN) injection 4 mg, 4 mg, Intravenous, Q6H PRN, Tat, David, MD   ondansetron (ZOFRAN) injection 4 mg, 4 mg, Intravenous, Q8H, Montez Morita, Asmaa Tirpak, MD, 4 mg at 05/28/22 0501   [START ON 05/31/2022] pantoprazole (PROTONIX) injection 40 mg, 40 mg, Intravenous, Q12H, Tat, David, MD   pantoprozole (PROTONIX) 80 mg /NS 100 mL infusion, 8 mg/hr, Intravenous, Continuous, Tat, David, MD, Last Rate: 10 mL/hr at 05/28/22 0642, 8 mg/hr at 05/28/22 0642   prochlorperazine (COMPAZINE) injection 10 mg, 10 mg,  Intravenous, Q6H PRN, Montez Morita, Quillian Quince, MD   sertraline (ZOLOFT) tablet 100 mg, 100 mg, Oral, Daily, Tat, David, MD, 100 mg at 05/28/22 0913   I/O    Intake/Output Summary (Last 24 hours) at 05/28/2022 1216 Last data filed at 05/28/2022 1014 Gross per 24 hour  Intake 227.84 ml  Output 250 ml  Net -22.16 ml     Physical Exam: Temp:  [97.5 F (36.4 C)-99.4 F (37.4 C)] 97.9 F (36.6 C) (12/16 1136) Pulse Rate:  [76-93] 80 (12/16 0913) Resp:  [13-30] 14 (12/16 0600) BP: (112-171)/(59-87) 141/62 (12/16 0913) SpO2:  [94 %-100 %] 97 % (12/16 0800) Weight:  [83.9 kg-84.6 kg] 84.6 kg (12/16 0343)  Temp (24hrs), Avg:98.2 F (36.8 C), Min:97.5 F (36.4 C), Max:99.4 F (37.4 C) GENERAL: The patient is AO x3, in no acute distress. HEENT: Head is normocephalic and atraumatic. EOMI are intact. Mouth is well hydrated and without lesions. NECK: Supple. No masses LUNGS: Clear to auscultation. No presence of rhonchi/wheezing/rales. Adequate chest expansion HEART: RRR, normal s1 and s2. ABDOMEN: Soft, nontender, no guarding, no peritoneal signs, and nondistended. BS +. No masses. EXTREMITIES: Without any cyanosis, clubbing, rash, lesions or edema. NEUROLOGIC: AOx3, no focal motor deficit. SKIN: no jaundice, no rashes  Laboratory Data: CBC:     Component Value Date/Time   WBC 7.1 05/28/2022 0354   RBC 3.83 (L) 05/28/2022 0354   HGB 10.4 (L) 05/28/2022 0354   HGB 9.0 (L) 11/01/2021 1439   HCT 33.8 (L) 05/28/2022 0354   HCT  28.1 (L) 11/01/2021 1439   PLT 246 05/28/2022 0354   PLT 254 11/01/2021 1439   MCV 88.3 05/28/2022 0354   MCV 88 11/01/2021 1439   MCH 27.2 05/28/2022 0354   MCHC 30.8 05/28/2022 0354   RDW 18.4 (H) 05/28/2022 0354   RDW 15.9 (H) 11/01/2021 1439   LYMPHSABS 0.8 05/27/2022 1138   LYMPHSABS 0.9 10/07/2021 1202   MONOABS 0.7 05/27/2022 1138   EOSABS 0.1 05/27/2022 1138   EOSABS 0.2 10/07/2021 1202   BASOSABS 0.1 05/27/2022 1138   BASOSABS 0.1  10/07/2021 1202   COAG:  Lab Results  Component Value Date   INR 1.4 (H) 02/28/2022   INR 1.3 (H) 09/28/2021   INR 1.2 08/29/2021    BMP:     Latest Ref Rng & Units 05/28/2022    3:54 AM 05/27/2022   12:54 PM 05/27/2022   12:00 PM  BMP  Glucose 70 - 99 mg/dL 131  230  233   BUN 8 - 23 mg/dL 74  85  69   Creatinine 0.61 - 1.24 mg/dL 5.36  5.50  5.50   Sodium 135 - 145 mmol/L 138  133  133   Potassium 3.5 - 5.1 mmol/L 4.2  5.5  4.8   Chloride 98 - 111 mmol/L 105  98  99   CO2 22 - 32 mmol/L 22     Calcium 8.9 - 10.3 mg/dL 8.3       HEPATIC:     Latest Ref Rng & Units 05/28/2022    3:54 AM 05/27/2022   11:38 AM 05/05/2022    3:53 AM  Hepatic Function  Total Protein 6.5 - 8.1 g/dL 6.0  7.0    Albumin 3.5 - 5.0 g/dL 3.1  3.7  3.3   AST 15 - 41 U/L 8  15    ALT 0 - 44 U/L 12  17    Alk Phosphatase 38 - 126 U/L 84  111    Total Bilirubin 0.3 - 1.2 mg/dL 0.7  1.1      CARDIAC:  Lab Results  Component Value Date   CKTOTAL 114 08/21/2019   TROPONINI 0.15 ([place]) 04/26/2018      Imaging: I personally reviewed and interpreted the available labs, imaging and endoscopic files.   Assessment/Plan: 66 y.o. year old male with a history of DM, cardiomyopathy with systolic and diastolic CHF, CABG, ESRD on dialysis, stroke, HTN, diabetes, Barrett's esophagus, esophageal adenocarcinoma (diagnosed in 2022, s/p EMR, cryotherapy, and RFA in April 2023, not candidate for esophagectomy), who came to the hospital after presenting recurrent episodes of hematemesis.  The patient had large-volume hematemesis.  He was hemodynamically stable but had alteration of his mental status.  Due to this, he underwent an EGD emergently yesterday which showed presence of a large clot in his esophagus which was likely adhered to the lower third of the esophagus, stomach had presence of large amount of old blood but no active bleeding.  The patient is presenting with new onset of upper gastrointestinal bleeding  for which she will need to continue with PPI drip for 72 hours and current antiemetic regimen.  Ideally, we should perform a second look to his upper GI tract and determine the etiology of his bleeding, which was possibly related to severe esophagitis or Mallory-Weiss tear, less likely related to a mass as this was not visualized but the inspection was limited due to the presence of clots.  - Clear liquid diet - Check H/H every 12  hours - Active T/S - PPI drip. - Standing Zofran 4 mg every 8 hours - Compazine PRN if recurrent nausea/vomiting. - Will need repeat EGD during this admission, possibly on Monday in case surgical/IR backup is needed.  [name] Peppers, MD Gastroenterology and Hepatology [facility]

## 2022-05-28 NOTE — Care Management Obs Status (Signed)
MEDICARE OBSERVATION STATUS NOTIFICATION   Patient Details  Name: [name] MRN: [id number] Date of Birth: [birth year]   Medicare Observation Status Notification Given:  Yes    Boneta Lucks, RN 05/28/2022, 12:50 PM

## 2022-05-29 DIAGNOSIS — D62 Acute posthemorrhagic anemia: Secondary | ICD-10-CM | POA: Diagnosis not present

## 2022-05-29 DIAGNOSIS — N186 End stage renal disease: Secondary | ICD-10-CM | POA: Diagnosis not present

## 2022-05-29 DIAGNOSIS — K920 Hematemesis: Secondary | ICD-10-CM | POA: Diagnosis not present

## 2022-05-29 DIAGNOSIS — C159 Malignant neoplasm of esophagus, unspecified: Secondary | ICD-10-CM | POA: Diagnosis not present

## 2022-05-29 DIAGNOSIS — K922 Gastrointestinal hemorrhage, unspecified: Secondary | ICD-10-CM | POA: Diagnosis not present

## 2022-05-29 LAB — GLUCOSE, CAPILLARY
Glucose-Capillary: 115 mg/dL — ABNORMAL HIGH (ref 70–99)
Glucose-Capillary: 121 mg/dL — ABNORMAL HIGH (ref 70–99)
Glucose-Capillary: 128 mg/dL — ABNORMAL HIGH (ref 70–99)
Glucose-Capillary: 145 mg/dL — ABNORMAL HIGH (ref 70–99)
Glucose-Capillary: 151 mg/dL — ABNORMAL HIGH (ref 70–99)
Glucose-Capillary: 215 mg/dL — ABNORMAL HIGH (ref 70–99)
Glucose-Capillary: 221 mg/dL — ABNORMAL HIGH (ref 70–99)

## 2022-05-29 LAB — HEMOGLOBIN AND HEMATOCRIT, BLOOD
HCT: 31.7 % — ABNORMAL LOW (ref 39.0–52.0)
HCT: 30.8 % — ABNORMAL LOW (ref 39.0–52.0)
HCT: 30.5 % — ABNORMAL LOW (ref 39.0–52.0)
Hemoglobin: 9.8 g/dL — ABNORMAL LOW (ref 13.0–17.0)
Hemoglobin: 9.4 g/dL — ABNORMAL LOW (ref 13.0–17.0)
Hemoglobin: 9.5 g/dL — ABNORMAL LOW (ref 13.0–17.0)

## 2022-05-29 LAB — HEPATITIS B SURFACE ANTIBODY, QUANTITATIVE: Hep B S AB Quant (Post): 99 m[IU]/mL (ref >9.9)

## 2022-05-29 NOTE — H&P (View-Only) (Signed)
[name], M.D. Gastroenterology & Hepatology   Interval History:  NAEON. Patient reports feeling well and denies having any nausea or vomiting.  Had a couple of episodes of melena yesterday but no hematochezia.  Denies any abdominal pain, fever, chills.  Inpatient Medications:  Current Facility-Administered Medications:    acetaminophen (TYLENOL) tablet 650 mg, 650 mg, Oral, Q6H PRN **OR** acetaminophen (TYLENOL) suppository 650 mg, 650 mg, Rectal, Q6H PRN, Tat, David, MD   amiodarone (PACERONE) tablet 200 mg, 200 mg, Oral, Daily, Tat, David, MD, 200 mg at 05/28/22 0913   ARIPiprazole (ABILIFY) tablet 5 mg, 5 mg, Oral, Daily, Tat, David, MD, 5 mg at 05/28/22 0913   calcium acetate (PHOSLO) capsule 667 mg, 667 mg, Oral, TID WC, Tat, Shanon Brow, MD   Chlorhexidine Gluconate Cloth 2 % PADS 6 each, 6 each, Topical, Q0600, Tat, Shanon Brow, MD, 6 each at 05/29/22 0630   insulin aspart (novoLOG) injection 0-6 Units, 0-6 Units, Subcutaneous, Q4H, Tat, Shanon Brow, MD, 1 Units at 05/28/22 1223   isosorbide dinitrate (ISORDIL) tablet 30 mg, 30 mg, Oral, TID, Tat, David, MD, 30 mg at 05/28/22 2158   metoprolol succinate (TOPROL-XL) 24 hr tablet 50 mg, 50 mg, Oral, Daily, Tat, David, MD, 50 mg at 05/28/22 0913   ondansetron (ZOFRAN) tablet 4 mg, 4 mg, Oral, Q6H PRN **OR** ondansetron (ZOFRAN) injection 4 mg, 4 mg, Intravenous, Q6H PRN, Tat, David, MD   ondansetron (ZOFRAN) injection 4 mg, 4 mg, Intravenous, Q8H, Montez Morita, Noam Franzen, MD, 4 mg at 05/29/22 0630   [START ON 05/31/2022] pantoprazole (PROTONIX) injection 40 mg, 40 mg, Intravenous, Q12H, Tat, David, MD   pantoprozole (PROTONIX) 80 mg /NS 100 mL infusion, 8 mg/hr, Intravenous, Continuous, Tat, David, MD, Last Rate: 10 mL/hr at 05/28/22 1730, 8 mg/hr at 05/28/22 1730   prochlorperazine (COMPAZINE) injection 10 mg, 10 mg, Intravenous, Q6H PRN, Montez Morita, Quillian Quince, MD   sertraline (ZOLOFT) tablet 100 mg, 100 mg, Oral, Daily, Tat, David, MD, 100  mg at 05/28/22 0913   I/O    Intake/Output Summary (Last 24 hours) at 05/29/2022 0913 Last data filed at 05/28/2022 1843 Gross per 24 hour  Intake 807.84 ml  Output 2450 ml  Net -1642.16 ml     Physical Exam: Temp:  [97.3 F (36.3 C)-98.2 F (36.8 C)] 97.3 F (36.3 C) (12/17 0740) Pulse Rate:  [68-80] 71 (12/17 0500) Resp:  [11-21] 17 (12/17 0500) BP: (102-150)/(40-80) 126/49 (12/17 0500) SpO2:  [94 %-100 %] 97 % (12/17 0500) Weight:  [81.4 kg-84.8 kg] 81.4 kg (12/17 0500)  Temp (24hrs), Avg:97.9 F (36.6 C), Min:97.3 F (36.3 C), Max:98.2 F (36.8 C)  GENERAL: The patient is AO x3, in no acute distress. HEENT: Head is normocephalic and atraumatic. EOMI are intact. Mouth is well hydrated and without lesions. NECK: Supple. No masses LUNGS: Clear to auscultation. No presence of rhonchi/wheezing/rales. Adequate chest expansion HEART: RRR, normal s1 and s2. ABDOMEN: Soft, nontender, no guarding, no peritoneal signs, and nondistended. BS +. No masses. EXTREMITIES: Without any cyanosis, clubbing, rash, lesions or edema. NEUROLOGIC: AOx3, no focal motor deficit. SKIN: no jaundice, no rashes  Laboratory Data: CBC:     Component Value Date/Time   WBC 7.1 05/28/2022 0354   RBC 3.83 (L) 05/28/2022 0354   HGB 9.8 (L) 05/29/2022 0448   HGB 9.0 (L) 11/01/2021 1439   HCT 31.7 (L) 05/29/2022 0448   HCT 28.1 (L) 11/01/2021 1439   PLT 246 05/28/2022 0354   PLT 254 11/01/2021 1439   MCV 88.3  05/28/2022 0354   MCV 88 11/01/2021 1439   MCH 27.2 05/28/2022 0354   MCHC 30.8 05/28/2022 0354   RDW 18.4 (H) 05/28/2022 0354   RDW 15.9 (H) 11/01/2021 1439   LYMPHSABS 0.8 05/27/2022 1138   LYMPHSABS 0.9 10/07/2021 1202   MONOABS 0.7 05/27/2022 1138   EOSABS 0.1 05/27/2022 1138   EOSABS 0.2 10/07/2021 1202   BASOSABS 0.1 05/27/2022 1138   BASOSABS 0.1 10/07/2021 1202   COAG:  Lab Results  Component Value Date   INR 1.4 (H) 02/28/2022   INR 1.3 (H) 09/28/2021   INR 1.2 08/29/2021     BMP:     Latest Ref Rng & Units 05/28/2022    3:54 AM 05/27/2022   12:54 PM 05/27/2022   12:00 PM  BMP  Glucose 70 - 99 mg/dL 131  230  233   BUN 8 - 23 mg/dL 74  85  69   Creatinine 0.61 - 1.24 mg/dL 5.36  5.50  5.50   Sodium 135 - 145 mmol/L 138  133  133   Potassium 3.5 - 5.1 mmol/L 4.2  5.5  4.8   Chloride 98 - 111 mmol/L 105  98  99   CO2 22 - 32 mmol/L 22     Calcium 8.9 - 10.3 mg/dL 8.3       HEPATIC:     Latest Ref Rng & Units 05/28/2022    3:54 AM 05/27/2022   11:38 AM 05/05/2022    3:53 AM  Hepatic Function  Total Protein 6.5 - 8.1 g/dL 6.0  7.0    Albumin 3.5 - 5.0 g/dL 3.1  3.7  3.3   AST 15 - 41 U/L 8  15    ALT 0 - 44 U/L 12  17    Alk Phosphatase 38 - 126 U/L 84  111    Total Bilirubin 0.3 - 1.2 mg/dL 0.7  1.1      CARDIAC:  Lab Results  Component Value Date   CKTOTAL 114 08/21/2019   TROPONINI 0.15 ([place]) 04/26/2018      Imaging: I personally reviewed and interpreted the available labs, imaging and endoscopic files.   Assessment/Plan: 66 y.o. year old male with a history of DM, cardiomyopathy with systolic and diastolic CHF, CABG, ESRD on dialysis, stroke, HTN, diabetes, Barrett's esophagus, esophageal adenocarcinoma (diagnosed in 2022, s/p EMR, cryotherapy, and RFA in April 2023, not candidate for esophagectomy), who came to the hospital after presenting recurrent episodes of hematemesis.  The patient had large-volume hematemesis.  He was hemodynamically stable but had alteration of his mental status.  Due to this, he underwent an EGD emergently yesterday which showed presence of a large clot in his esophagus which was likely adhered to the lower third of the esophagus (unclear if this was an esophageal ulcer versus Mallory-Weiss tear, less likely due to malignant ulceration), stomach had presence of large amount of old blood but no active bleeding.   The patient is presenting with new onset of upper gastrointestinal bleeding for which he will need to  continue with PPI drip for 72 hours and current antiemetic regimen.  We we will perform a second look to his upper GI tract tomorrow and determine the etiology of his bleeding, as been his patient was limited by large amount of clots.   - Clear liquid diet, n.p.o. after midnight - Check H/H every day - Active T/S - PPI drip. - Standing Zofran 4 mg every 8 hours - Compazine PRN if recurrent  nausea/vomiting. - Will repeat EGD tomorrow in case surgical/IR backup is needed.  [name] Peppers, MD Gastroenterology and Hepatology [facility]

## 2022-05-29 NOTE — Progress Notes (Signed)
[name], M.D. Gastroenterology & Hepatology   Interval History:  NAEON. Patient reports feeling well and denies having any nausea or vomiting.  Had a couple of episodes of melena yesterday but no hematochezia.  Denies any abdominal pain, fever, chills.  Inpatient Medications:  Current Facility-Administered Medications:    acetaminophen (TYLENOL) tablet 650 mg, 650 mg, Oral, Q6H PRN **OR** acetaminophen (TYLENOL) suppository 650 mg, 650 mg, Rectal, Q6H PRN, Tat, David, MD   amiodarone (PACERONE) tablet 200 mg, 200 mg, Oral, Daily, Tat, David, MD, 200 mg at 05/28/22 0913   ARIPiprazole (ABILIFY) tablet 5 mg, 5 mg, Oral, Daily, Tat, David, MD, 5 mg at 05/28/22 0913   calcium acetate (PHOSLO) capsule 667 mg, 667 mg, Oral, TID WC, Tat, Shanon Brow, MD   Chlorhexidine Gluconate Cloth 2 % PADS 6 each, 6 each, Topical, Q0600, Tat, Shanon Brow, MD, 6 each at 05/29/22 0630   insulin aspart (novoLOG) injection 0-6 Units, 0-6 Units, Subcutaneous, Q4H, Tat, Shanon Brow, MD, 1 Units at 05/28/22 1223   isosorbide dinitrate (ISORDIL) tablet 30 mg, 30 mg, Oral, TID, Tat, David, MD, 30 mg at 05/28/22 2158   metoprolol succinate (TOPROL-XL) 24 hr tablet 50 mg, 50 mg, Oral, Daily, Tat, David, MD, 50 mg at 05/28/22 0913   ondansetron (ZOFRAN) tablet 4 mg, 4 mg, Oral, Q6H PRN **OR** ondansetron (ZOFRAN) injection 4 mg, 4 mg, Intravenous, Q6H PRN, Tat, David, MD   ondansetron (ZOFRAN) injection 4 mg, 4 mg, Intravenous, Q8H, Montez Morita, Annis Lagoy, MD, 4 mg at 05/29/22 0630   [START ON 05/31/2022] pantoprazole (PROTONIX) injection 40 mg, 40 mg, Intravenous, Q12H, Tat, David, MD   pantoprozole (PROTONIX) 80 mg /NS 100 mL infusion, 8 mg/hr, Intravenous, Continuous, Tat, David, MD, Last Rate: 10 mL/hr at 05/28/22 1730, 8 mg/hr at 05/28/22 1730   prochlorperazine (COMPAZINE) injection 10 mg, 10 mg, Intravenous, Q6H PRN, Montez Morita, Quillian Quince, MD   sertraline (ZOLOFT) tablet 100 mg, 100 mg, Oral, Daily, Tat, David, MD, 100  mg at 05/28/22 0913   I/O    Intake/Output Summary (Last 24 hours) at 05/29/2022 0913 Last data filed at 05/28/2022 1843 Gross per 24 hour  Intake 807.84 ml  Output 2450 ml  Net -1642.16 ml     Physical Exam: Temp:  [97.3 F (36.3 C)-98.2 F (36.8 C)] 97.3 F (36.3 C) (12/17 0740) Pulse Rate:  [68-80] 71 (12/17 0500) Resp:  [11-21] 17 (12/17 0500) BP: (102-150)/(40-80) 126/49 (12/17 0500) SpO2:  [94 %-100 %] 97 % (12/17 0500) Weight:  [81.4 kg-84.8 kg] 81.4 kg (12/17 0500)  Temp (24hrs), Avg:97.9 F (36.6 C), Min:97.3 F (36.3 C), Max:98.2 F (36.8 C)  GENERAL: The patient is AO x3, in no acute distress. HEENT: Head is normocephalic and atraumatic. EOMI are intact. Mouth is well hydrated and without lesions. NECK: Supple. No masses LUNGS: Clear to auscultation. No presence of rhonchi/wheezing/rales. Adequate chest expansion HEART: RRR, normal s1 and s2. ABDOMEN: Soft, nontender, no guarding, no peritoneal signs, and nondistended. BS +. No masses. EXTREMITIES: Without any cyanosis, clubbing, rash, lesions or edema. NEUROLOGIC: AOx3, no focal motor deficit. SKIN: no jaundice, no rashes  Laboratory Data: CBC:     Component Value Date/Time   WBC 7.1 05/28/2022 0354   RBC 3.83 (L) 05/28/2022 0354   HGB 9.8 (L) 05/29/2022 0448   HGB 9.0 (L) 11/01/2021 1439   HCT 31.7 (L) 05/29/2022 0448   HCT 28.1 (L) 11/01/2021 1439   PLT 246 05/28/2022 0354   PLT 254 11/01/2021 1439   MCV 88.3  05/28/2022 0354   MCV 88 11/01/2021 1439   MCH 27.2 05/28/2022 0354   MCHC 30.8 05/28/2022 0354   RDW 18.4 (H) 05/28/2022 0354   RDW 15.9 (H) 11/01/2021 1439   LYMPHSABS 0.8 05/27/2022 1138   LYMPHSABS 0.9 10/07/2021 1202   MONOABS 0.7 05/27/2022 1138   EOSABS 0.1 05/27/2022 1138   EOSABS 0.2 10/07/2021 1202   BASOSABS 0.1 05/27/2022 1138   BASOSABS 0.1 10/07/2021 1202   COAG:  Lab Results  Component Value Date   INR 1.4 (H) 02/28/2022   INR 1.3 (H) 09/28/2021   INR 1.2 08/29/2021     BMP:     Latest Ref Rng & Units 05/28/2022    3:54 AM 05/27/2022   12:54 PM 05/27/2022   12:00 PM  BMP  Glucose 70 - 99 mg/dL 131  230  233   BUN 8 - 23 mg/dL 74  85  69   Creatinine 0.61 - 1.24 mg/dL 5.36  5.50  5.50   Sodium 135 - 145 mmol/L 138  133  133   Potassium 3.5 - 5.1 mmol/L 4.2  5.5  4.8   Chloride 98 - 111 mmol/L 105  98  99   CO2 22 - 32 mmol/L 22     Calcium 8.9 - 10.3 mg/dL 8.3       HEPATIC:     Latest Ref Rng & Units 05/28/2022    3:54 AM 05/27/2022   11:38 AM 05/05/2022    3:53 AM  Hepatic Function  Total Protein 6.5 - 8.1 g/dL 6.0  7.0    Albumin 3.5 - 5.0 g/dL 3.1  3.7  3.3   AST 15 - 41 U/L 8  15    ALT 0 - 44 U/L 12  17    Alk Phosphatase 38 - 126 U/L 84  111    Total Bilirubin 0.3 - 1.2 mg/dL 0.7  1.1      CARDIAC:  Lab Results  Component Value Date   CKTOTAL 114 08/21/2019   TROPONINI 0.15 ([place]) 04/26/2018      Imaging: I personally reviewed and interpreted the available labs, imaging and endoscopic files.   Assessment/Plan: 66 y.o. year old male with a history of DM, cardiomyopathy with systolic and diastolic CHF, CABG, ESRD on dialysis, stroke, HTN, diabetes, Barrett's esophagus, esophageal adenocarcinoma (diagnosed in 2022, s/p EMR, cryotherapy, and RFA in April 2023, not candidate for esophagectomy), who came to the hospital after presenting recurrent episodes of hematemesis.  The patient had large-volume hematemesis.  He was hemodynamically stable but had alteration of his mental status.  Due to this, he underwent an EGD emergently yesterday which showed presence of a large clot in his esophagus which was likely adhered to the lower third of the esophagus (unclear if this was an esophageal ulcer versus Mallory-Weiss tear, less likely due to malignant ulceration), stomach had presence of large amount of old blood but no active bleeding.   The patient is presenting with new onset of upper gastrointestinal bleeding for which he will need to  continue with PPI drip for 72 hours and current antiemetic regimen.  We we will perform a second look to his upper GI tract tomorrow and determine the etiology of his bleeding, as been his patient was limited by large amount of clots.   - Clear liquid diet, n.p.o. after midnight - Check H/H every day - Active T/S - PPI drip. - Standing Zofran 4 mg every 8 hours - Compazine PRN if recurrent  nausea/vomiting. - Will repeat EGD tomorrow in case surgical/IR backup is needed.  [name] Peppers, MD Gastroenterology and Hepatology [facility]

## 2022-05-29 NOTE — Progress Notes (Signed)
PROGRESS NOTE  [name]:[id number] DOB: August 04, 1957 DOA: 05/27/2022 PCP: [name], MD  Brief History:  66 year old male with a history of coronary disease status post CABG, systolic and diastolic CHF, diabetes mellitus type 2, hypertension, ESRD, SVT, esophageal adenocarcinoma, and OSA presenting with hematemesis during his dialysis session earlier today on 05/27/2022.  The patient actually states that he had an episode of hematemesis on the evening of 05/26/2022.  The patient was recently admitted to the hospital from 05/03/2022 to 05/05/2022 secondary to Enterobacter cloacae bacteremia.  He was treated with cefepime on dialysis.  He was also fluid overloaded during that admission.  He was discharged in stable condition to complete a course of cefepime. His dialysis catheter was removed on 05/24/2022 at [facility], and a new dialysis catheter was placed on the same day. He was at [facility] to have his usual surveillance EGD on 05/26/2022, but ended up having SVT and hypotension.  He was transferred to the ED.  He stabilized and was discharged home.  Notably, the patient was recently admitted to the hospital The patient had been in his usual state of health until yesterday, 05/26/2022.  Apparently he was going for an EGD, but he was noted to have SVT with heart rate in the 160s.  It was canceled.  The patient denies any NSAIDs. He only completed 30 minutes of his dialysis on 05/27/2022. Patient denies fevers, chills, headache, chest pain, dyspnea, nausea, vomiting, diarrhea, abdominal pain, dysuria, hematuria, hematochezia, and melena. In the ED, the patient was afebrile hemodynamically stable with oxygen saturation 100% room air.  WBC 8.6, hemoglobin 12.1, platelets 244,000.  Sodium 135, potassium 4.5, bicarbonate 24, serum creatinine 4.4.  LFTs were unremarkable.  CT of the abdomen and chest did not show any metastasis.  There is no obvious mass in the  esophagus.  There is interval improvement of aeration in the right lung with persistent right pleural effusion.  There is no bowel wall thickening.  Left lung is clear.  GI was consulted to assist with management.   Assessment/Plan: Hematemesis/Acute Blood Loss Anemia -GI consulted -continue pantoprazole drip -12/15 EGD clotted blood in the entire esophagus, clots and old blood in the stomach.  No active bleeding noted. -Hgb 13.3>>10.1>>9.4 -planning repeat EGD 12/18   Chronic combined systolic and diastolic CHF -Clinically euvolemic -07/25/21 Echo--EF 40-45%, no WMA, mod RV dysfx, PASP 56.5  -03/01/22 Echo--EF 40-45%, global HK, RV overload  -Fluid removal via dialysis   ESRD -He dialyzes on Monday, Wednesday, Friday -Consult nephrology for maintenance dialysis -He was started on dialysis sometime in August 2023 -Nephrology consulted for maintenance dialysis -dialyzed on 12/16   Right pleural effusion -This has been chronic -He has had previous thoracocentesis in the past, last done 03/01/2022 -stable on RA   S/P CABG (coronary artery bypass graft) Recent cardiac cath 01/2021, showed patent grafts, severe three-vessel native CAD.Marland Kitchen  History of CABG 2020. -No chest pain presently   SVT history -Continue amiodarone 200 mg daily   Essential hypertension -Restart metoprolol succinate -Holding hydralazine and doxazosin for soft blood pressures initially   Esophageal adenocarcinoma (HCC) Follows GI at [facility], Dr. [name] --considered poor candidate for esophagectomy. After multidisciplinary discussion the plan was to continue with endoscopic treatments, and continue to follow-up with oncology for surveillance and any signs of progression of disease -status post EMR, cryotherapy, and most recently RFA in April 2023 -02/10/22 PET scan--no local recurrence  Family Communication:  significant other updated 12/16   Consultants:  GI   Code Status:  DNR    DVT Prophylaxis:  SCDs     Procedures: As Listed in Progress Note Above   Antibiotics: None       Subjective: No further hematemesis.  Denies f/c, cp, sob, n/v/d, abd pain  Objective: Vitals:   05/29/22 0500 05/29/22 0740 05/29/22 0951 05/29/22 1137  BP: (!) 126/49  (!) 141/62   Pulse: 71  71 73  Resp: 17   (!) 21  Temp:  (!) 97.3 F (36.3 C)  (!) 97.5 F (36.4 C)  TempSrc:  Oral  Oral  SpO2: 97% 98%  97%  Weight: 81.4 kg     Height:        Intake/Output Summary (Last 24 hours) at 05/29/2022 1553 Last data filed at 05/29/2022 1000 Gross per 24 hour  Intake 687.84 ml  Output 2200 ml  Net -1512.16 ml   Weight change: -1.383 kg Exam:  General:  Pt is alert, follows commands appropriately, not in acute distress HEENT: No icterus, No thrush, No neck mass, [place]/AT Cardiovascular: RRR, S1/S2, no rubs, no gallops Respiratory: bibasilar crackles.  No wheeze Abdomen: Soft/+BS, non tender, non distended, no guarding Extremities: No edema, No lymphangitis, No petechiae, No rashes, no synovitis   Data Reviewed: I have personally reviewed following labs and imaging studies Basic Metabolic Panel: Recent Labs  Lab 05/27/22 1138 05/27/22 1200 05/27/22 1254 05/28/22 0354  NA 135 133* 133* 138  K 4.5 4.8 5.5* 4.2  CL 96* 99 98 105  CO2 24  --   --  22  GLUCOSE 231* 233* 230* 131*  BUN 66* 69* 85* 74*  CREATININE 4.84* 5.50* 5.50* 5.36*  CALCIUM 8.6*  --   --  8.3*   Liver Function Tests: Recent Labs  Lab 05/27/22 1138 05/28/22 0354  AST 15 8*  ALT 17 12  ALKPHOS 111 84  BILITOT 1.1 0.7  PROT 7.0 6.0*  ALBUMIN 3.7 3.1*   No results for input(s): "LIPASE", "AMYLASE" in the last 168 hours. No results for input(s): "AMMONIA" in the last 168 hours. Coagulation Profile: No results for input(s): "INR", "PROTIME" in the last 168 hours. CBC: Recent Labs  Lab 05/27/22 1138 05/27/22 1200 05/28/22 0354 05/28/22 1353 05/28/22 2219 05/29/22 0448  05/29/22 1301  WBC 8.6  --  7.1  --   --   --   --   NEUTROABS 6.9  --   --   --   --   --   --   HGB 12.1*   < > 10.4* 9.8* 9.7* 9.8* 9.4*  HCT 39.9   < > 33.8* 31.7* 31.7* 31.7* 30.8*  MCV 88.3  --  88.3  --   --   --   --   PLT 244  --  246  --   --   --   --    < > = values in this interval not displayed.   Cardiac Enzymes: No results for input(s): "CKTOTAL", "CKMB", "CKMBINDEX", "TROPONINI" in the last 168 hours. BNP: Invalid input(s): "POCBNP" CBG: Recent Labs  Lab 05/29/22 0016 05/29/22 0414 05/29/22 0739 05/29/22 0754 05/29/22 1141  GLUCAP 145* 115* 128* 121* 151*   HbA1C: No results for input(s): "HGBA1C" in the last 72 hours. Urine analysis:    Component Value Date/Time   COLORURINE YELLOW 02/28/2022 [place] 02/28/2022 1414   LABSPEC 1.014 02/28/2022 1414   PHURINE  5.0 02/28/2022 1414   GLUCOSEU >=500 (A) 02/28/2022 1414   HGBUR NEGATIVE 02/28/2022 1414   BILIRUBINUR NEGATIVE 02/28/2022 1414   KETONESUR NEGATIVE 02/28/2022 1414   PROTEINUR >=300 (A) 02/28/2022 1414   NITRITE NEGATIVE 02/28/2022 1414   LEUKOCYTESUR NEGATIVE 02/28/2022 1414   Sepsis Labs: '@LABRCNTIP'$ (procalcitonin:4,lacticidven:4) ) Recent Results (from the past 240 hour(s))  MRSA Next Gen by PCR, Nasal     Status: None   Collection Time: 05/27/22  5:00 PM   Specimen: Nasal Mucosa; Nasal Swab  Result Value Ref Range Status   MRSA by PCR Next Gen NOT DETECTED NOT DETECTED Final    Comment: (NOTE) The GeneXpert MRSA Assay (FDA approved for NASAL specimens only), is one component of a comprehensive MRSA colonization surveillance program. It is not intended to diagnose MRSA infection nor to guide or monitor treatment for MRSA infections. Test performance is not FDA approved in patients less than 74 years old. Performed at [facility], [address], [place], [place] [address]      Scheduled Meds:  amiodarone  200 mg Oral Daily   ARIPiprazole  5 mg Oral Daily    calcium acetate  667 mg Oral TID WC   Chlorhexidine Gluconate Cloth  6 each Topical Q0600   insulin aspart  0-6 Units Subcutaneous Q4H   isosorbide dinitrate  30 mg Oral TID   metoprolol succinate  50 mg Oral Daily   ondansetron (ZOFRAN) IV  4 mg Intravenous Q8H   [START ON 05/31/2022] pantoprazole  40 mg Intravenous Q12H   sertraline  100 mg Oral Daily   Continuous Infusions:  pantoprazole 8 mg/hr (05/29/22 0950)    Procedures/Studies: CT CHEST ABDOMEN PELVIS WO CONTRAST  Result Date: 05/21/2022 CLINICAL DATA:  Malignant neoplasm of stomach; adenocarcinoma of esophagus. * Tracking Code: BO * EXAM: CT CHEST, ABDOMEN AND PELVIS WITHOUT CONTRAST TECHNIQUE: Multidetector CT imaging of the chest, abdomen and pelvis was performed following the standard protocol without IV contrast. RADIATION DOSE REDUCTION: This exam was performed according to the departmental dose-optimization program which includes automated exposure control, adjustment of the mA and/or kV according to patient size and/or use of iterative reconstruction technique. COMPARISON:  Chest CTA 05/03/2022.  Abdominopelvic CT 03/03/2022. FINDINGS: CT CHEST FINDINGS Cardiovascular: Atherosclerosis of the aorta, great vessels and coronary arteries status post median sternotomy and CABG. A right IJ central venous catheter projects to the upper right atrium, unchanged. Stable mild cardiomegaly. No significant pericardial fluid. Mediastinum/Nodes: There are no enlarged mediastinal, hilar or axillary lymph nodes. Hilar assessment is limited by the lack of intravenous contrast, although the hilar contours appear unchanged. The thyroid gland, trachea and esophagus demonstrate no significant findings. Lungs/Pleura: Moderate-sized dependent right pleural effusion is similar to the recent prior examination. Interval improved aeration of the right lower lobe with residual atelectasis. The aeration of the right upper and middle lobes has also improved. The  left lung is clear. No suspicious pulmonary nodules. Musculoskeletal/Chest wall: No chest wall mass or suspicious osseous findings. Several nonacute right-sided rib fractures appear unchanged. CT ABDOMEN AND PELVIS FINDINGS Hepatobiliary: No focal hepatic abnormalities on noncontrast imaging. No evidence of gallstones, gallbladder wall thickening or biliary dilatation. Pancreas: Unremarkable. No pancreatic ductal dilatation or surrounding inflammatory changes. Spleen: Normal in size without focal abnormality. Adrenals/Urinary Tract: Both adrenal glands appear normal. No evidence of urinary tract calculus, suspicious renal lesion or hydronephrosis. Unchanged same nephric soft tissue stranding bilaterally and low-density renal lesions consistent with cysts; no follow-up imaging recommended. The bladder appears unremarkable  for its degree of distention. Stomach/Bowel: No enteric contrast administered. No obvious mass or concerning thickening of the distal esophagus or stomach identified. No bowel wall thickening, distention or surrounding inflammation. The appendix appears normal. There is diverticulosis of the descending and sigmoid colon. Vascular/Lymphatic: There are no enlarged abdominal or pelvic lymph nodes. Aortic and branch vessel atherosclerosis without evidence of aneurysm. Reproductive: Stable mild enlargement of the prostate gland. Other: Small periumbilical hernia containing only fat. No ascites, peritoneal nodularity or free air. Musculoskeletal: No acute or significant osseous findings. Mild lumbar spondylosis. IMPRESSION: 1. No evidence of metastatic disease within the chest, abdomen or pelvis on noncontrast imaging. No obvious lesion of the esophagus or stomach on noncontrast imaging. 2. Interval improved aeration of the right lung with persistent moderate-sized right pleural effusion. 3. Distal colonic diverticulosis without acute inflammation. 4.  Aortic Atherosclerosis (ICD10-I70.0). Electronically  Signed   By: Richardean Sale M.D.   On: 05/21/2022 14:26   CT Angio Chest PE W and/or Wo Contrast  Result Date: 05/03/2022 CLINICAL DATA:  Pulmonary embolism (PE) suspected, low to intermediate prob, positive D-dimer Pt arrived via [facility] c/o 3 hrs and 5 mins of dialysis treatment when he had a sudden onset of Nausea and hypertension. EXAM: CT ANGIOGRAPHY CHEST WITH CONTRAST TECHNIQUE: Multidetector CT imaging of the chest was performed using the standard protocol during bolus administration of intravenous contrast. Multiplanar CT image reconstructions and MIPs were obtained to evaluate the vascular anatomy. RADIATION DOSE REDUCTION: This exam was performed according to the departmental dose-optimization program which includes automated exposure control, adjustment of the mA and/or kV according to patient size and/or use of iterative reconstruction technique. CONTRAST:  82m OMNIPAQUE IOHEXOL 350 MG/ML SOLN COMPARISON:  Chest XR, concurrent.  CTA chest, 02/28/2022. FINDINGS: Suboptimal evaluation, secondary to motion degradation. Cardiovascular: Satisfactory opacification of the pulmonary arteries to the segmental level. No segmental or larger pulmonary embolus. Dilated main PA, measuring up to 4.2 cm. Multi chamber cardiac enlargement. No pericardial effusion. 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 RIGHT jugular approach dialysis catheter, well-positioned with tip within the proximal RIGHT atrium. Mediastinum/Nodes: No enlarged mediastinal, hilar, or axillary lymph nodes. Thyroid gland, trachea, and esophagus demonstrate no significant findings. Lungs/Pleura: Small volume RIGHT pleural effusion with adjacent dependent atelectasis. Mild bilateral pulmonary interstitial thickening with trace ground-glass opacities. No suspicious pulmonary nodule or mass. No pneumothorax. Upper Abdomen: No acute abnormality. Musculoskeletal: Postsurgical changes of median  sternotomy. No acute chest wall abnormality. No significant osseous findings. Review of the MIP images confirms the above findings. IMPRESSION: Suboptimal evaluation, within these constraints; 1.  No segmental or larger pulmonary embolus. 2. Main pulmonary artery dilation, likely reflecting underlying pulmonary arterial hypertension. 3. Cardiomegaly, small volume RIGHT pleural effusion and interstitial thickening. Findings suspicious for early/developing pulmonary edema. Additional incidental, chronic and senescent findings as above. Electronically Signed   By: JMichaelle BirksM.D.   On: 05/03/2022 17:06   DG Chest Port 1 View  Result Date: 05/03/2022 CLINICAL DATA:  Shortness of breath, onset of nausea and hypertension during dialysis treatment EXAM: PORTABLE CHEST 1 VIEW COMPARISON:  Portable exam 1420 hours compared to 03/03/2022 FINDINGS: RIGHT jugular central venous catheter with tip projecting over RIGHT atrium. Enlargement of cardiac silhouette with slight vascular congestion post CABG. RIGHT pleural effusion and mild RIGHT basilar atelectasis seen. Accentuation of interstitial markings similar to prior exam. Linear atelectasis RIGHT upper lobe unchanged. No pneumothorax or acute osseous findings. IMPRESSION: Enlargement of cardiac silhouette with vascular  congestion. Small RIGHT pleural effusion and basilar atelectasis with persistent accentuation of interstitial markings and linear atelectasis in RIGHT upper lobe. Electronically Signed   By: Lavonia Dana M.D.   On: 05/03/2022 14:34    Orson Eva, DO  [facility]  If 7PM-7AM, please contact night-coverage [email or web address] Password [email or web address] 05/29/2022, 3:53 PM   LOS: 1 day

## 2022-05-29 NOTE — Anesthesia Postprocedure Evaluation (Signed)
Anesthesia Post Note  Patient: [name]  Procedure(s) Performed: ESOPHAGOGASTRODUODENOSCOPY (EGD) WITH PROPOFOL  Patient location during evaluation: Phase II Anesthesia Type: General Level of consciousness: awake Pain management: pain level controlled Vital Signs Assessment: post-procedure vital signs reviewed and stable Respiratory status: spontaneous breathing and respiratory function stable Cardiovascular status: blood pressure returned to baseline and stable Postop Assessment: no headache and no apparent nausea or vomiting Anesthetic complications: no Comments: Late entry   No notable events documented.   Last Vitals:  Vitals:   05/29/22 0951 05/29/22 1137  BP: (!) 141/62   Pulse: 71 73  Resp:  (!) 21  Temp:  (!) 36.4 C  SpO2:  97%    Last Pain:  Vitals:   05/29/22 1137  TempSrc: Oral  PainSc:                  [name]

## 2022-05-30 ENCOUNTER — Encounter (HOSPITAL_COMMUNITY)
Admission: EM | Disposition: A | Discharge status: Home / Self Care | Payer: Self-pay | Source: Home / Self Care | Attending: Internal Medicine

## 2022-05-30 ENCOUNTER — Inpatient Hospital Stay (HOSPITAL_COMMUNITY): Payer: Medicare HMO | Admitting: Certified Registered"

## 2022-05-30 ENCOUNTER — Encounter (HOSPITAL_COMMUNITY): Payer: Self-pay | Admitting: Internal Medicine

## 2022-05-30 DIAGNOSIS — K209 Esophagitis, unspecified without bleeding: Secondary | ICD-10-CM

## 2022-05-30 DIAGNOSIS — K449 Diaphragmatic hernia without obstruction or gangrene: Secondary | ICD-10-CM

## 2022-05-30 DIAGNOSIS — G4730 Sleep apnea, unspecified: Secondary | ICD-10-CM

## 2022-05-30 DIAGNOSIS — K2270 Barrett's esophagus without dysplasia: Secondary | ICD-10-CM

## 2022-05-30 DIAGNOSIS — K317 Polyp of stomach and duodenum: Secondary | ICD-10-CM

## 2022-05-30 DIAGNOSIS — Z794 Long term (current) use of insulin: Secondary | ICD-10-CM

## 2022-05-30 DIAGNOSIS — K922 Gastrointestinal hemorrhage, unspecified: Secondary | ICD-10-CM | POA: Diagnosis not present

## 2022-05-30 DIAGNOSIS — E1151 Type 2 diabetes mellitus with diabetic peripheral angiopathy without gangrene: Secondary | ICD-10-CM

## 2022-05-30 DIAGNOSIS — N186 End stage renal disease: Secondary | ICD-10-CM | POA: Diagnosis not present

## 2022-05-30 DIAGNOSIS — D62 Acute posthemorrhagic anemia: Secondary | ICD-10-CM | POA: Diagnosis not present

## 2022-05-30 DIAGNOSIS — K920 Hematemesis: Secondary | ICD-10-CM | POA: Diagnosis not present

## 2022-05-30 HISTORY — PX: ESOPHAGOGASTRODUODENOSCOPY (EGD) WITH PROPOFOL: SHX5813

## 2022-05-30 HISTORY — PX: BIOPSY: SHX5522

## 2022-05-30 LAB — GLUCOSE, CAPILLARY
Glucose-Capillary: 131 mg/dL — ABNORMAL HIGH (ref 70–99)
Glucose-Capillary: 119 mg/dL — ABNORMAL HIGH (ref 70–99)
Glucose-Capillary: 168 mg/dL — ABNORMAL HIGH (ref 70–99)
Glucose-Capillary: 145 mg/dL — ABNORMAL HIGH (ref 70–99)
Glucose-Capillary: 146 mg/dL — ABNORMAL HIGH (ref 70–99)
Glucose-Capillary: 156 mg/dL — ABNORMAL HIGH (ref 70–99)
Glucose-Capillary: 231 mg/dL — ABNORMAL HIGH (ref 70–99)

## 2022-05-30 LAB — HEMOGLOBIN AND HEMATOCRIT, BLOOD
HCT: 32.2 % — ABNORMAL LOW (ref 39.0–52.0)
Hemoglobin: 10 g/dL — ABNORMAL LOW (ref 13.0–17.0)

## 2022-05-30 SURGERY — ESOPHAGOGASTRODUODENOSCOPY (EGD) WITH PROPOFOL
Anesthesia: General

## 2022-05-30 MED ORDER — LIDOCAINE HCL (CARDIAC) PF 100 MG/5ML IV SOSY
PREFILLED_SYRINGE | INTRAVENOUS | Status: DC | PRN
  Administered 2022-05-30: 50 mg via INTRATRACHEAL

## 2022-05-30 MED ORDER — CHLORHEXIDINE GLUCONATE CLOTH 2 % EX PADS
6.0000 | MEDICATED_PAD | Freq: Every day | CUTANEOUS | Status: DC
  Administered 2022-05-30 – 2022-05-31 (×2): 6 via TOPICAL

## 2022-05-30 MED ORDER — SODIUM CHLORIDE 0.9 % IV SOLN: INTRAVENOUS | Status: DC

## 2022-05-30 MED ORDER — PANTOPRAZOLE SODIUM 40 MG PO TBEC: 40.0000 mg | DELAYED_RELEASE_TABLET | Freq: Two times a day (BID) | ORAL | 1 refills | Status: DC

## 2022-05-30 MED ORDER — PROPOFOL 10 MG/ML IV BOLUS
INTRAVENOUS | Status: DC | PRN
  Administered 2022-05-30: 80 mg via INTRAVENOUS
  Administered 2022-05-30 (×2): 50 mg via INTRAVENOUS

## 2022-05-30 MED ORDER — LACTATED RINGERS IV SOLN: INTRAVENOUS | Status: DC

## 2022-05-30 MED ORDER — CALCITRIOL 0.25 MCG PO CAPS
0.5000 ug | ORAL_CAPSULE | ORAL | Status: DC
  Administered 2022-05-31: 0.5 ug via ORAL
  Filled 2022-05-30: qty 2
  Filled 2022-05-30: qty 1

## 2022-05-30 MED ORDER — SODIUM CHLORIDE FLUSH 0.9 % IV SOLN
INTRAVENOUS | Status: AC
  Filled 2022-05-30: qty 10

## 2022-05-30 MED ORDER — INSULIN ASPART 100 UNIT/ML IJ SOLN
0.0000 [IU] | Freq: Three times a day (TID) | INTRAMUSCULAR | Status: DC
  Administered 2022-05-31 (×2): 1 [IU] via SUBCUTANEOUS

## 2022-05-30 NOTE — Discharge Summary (Incomplete)
Physician Discharge Summary   Patient: [name] MRN: [id number] DOB: 1955/09/29  Admit date:     05/27/2022  Discharge date: 05/31/2022  Discharge Physician: Shanon Brow Tiffiany Beadles   PCP: [name], MD   Recommendations at discharge:   Please follow up with primary care provider within 1-2 weeks  Please repeat BMP and CBC in one week      Hospital Course: 66 year old male with a history of coronary disease status post CABG, systolic and diastolic CHF, diabetes mellitus type 2, hypertension, ESRD, SVT, esophageal adenocarcinoma, and OSA presenting with hematemesis during his dialysis session earlier today on 05/27/2022.  The patient actually states that he had an episode of hematemesis on the evening of 05/26/2022.  The patient was recently admitted to the hospital from 05/03/2022 to 05/05/2022 secondary to Enterobacter cloacae bacteremia.  He was treated with cefepime on dialysis.  He was also fluid overloaded during that admission.  He was discharged in stable condition to complete a course of cefepime. His dialysis catheter was removed on 05/24/2022 at [facility], and a new dialysis catheter was placed on the same day. He was at [facility] to have his usual surveillance EGD on 05/26/2022, but ended up having SVT and hypotension.  He was transferred to the ED.  He stabilized and was discharged home.  Notably, the patient was recently admitted to the hospital The patient had been in his usual state of health until yesterday, 05/26/2022.  Apparently he was going for an EGD, but he was noted to have SVT with heart rate in the 160s.  It was canceled.  The patient denies any NSAIDs. He only completed 30 minutes of his dialysis on 05/27/2022. Patient denies fevers, chills, headache, chest pain, dyspnea, nausea, vomiting, diarrhea, abdominal pain, dysuria, hematuria, hematochezia, and melena. In the ED, the patient was afebrile hemodynamically stable with oxygen saturation 100% room  air.  WBC 8.6, hemoglobin 12.1, platelets 244,000.  Sodium 135, potassium 4.5, bicarbonate 24, serum creatinine 4.4.  LFTs were unremarkable.  CT of the abdomen and chest did not show any metastasis.  There is no obvious mass in the esophagus.  There is interval improvement of aeration in the right lung with persistent right pleural effusion.  There is no bowel wall thickening.  Left lung is clear.  GI was consulted to assist with management.  Assessment and Plan: Hematemesis/Acute Blood Loss Anemia -GI consulted -continue pantoprazole drip -12/15 EGD clotted blood in the entire esophagus, clots and old blood in the stomach.  No active bleeding noted. -Hgb 13.3>>10.1>>9.4>>10.0>>10.3 -repeat EGD 12/18--no active bleeding or stigmata of bleeding; Moderately severe esophagitis with no bleeding;   Esophageal mucosal changes secondary to established short-segment Barrett's disease -transition to bid pantoprazole -advance diet which pt tolerated   Chronic combined systolic and diastolic CHF -Clinically euvolemic -07/25/21 Echo--EF 40-45%, no WMA, mod RV dysfx, PASP 56.5  -03/01/22 Echo--EF 40-45%, global HK, RV overload  -Fluid removal via dialysis   ESRD -He dialyzes on Monday, Wednesday, Friday -Consult nephrology for maintenance dialysis -He was started on dialysis sometime in August 2023 -Nephrology consulted for maintenance dialysis -dialyzed on 12/16 -plan next HD 12/19   Right pleural effusion -This has been chronic -He has had previous thoracocentesis in the past, last done 03/01/2022 -stable on RA   S/P CABG (coronary artery bypass graft) Recent cardiac cath 01/2021, showed patent grafts, severe three-vessel native CAD.Marland Kitchen  History of CABG 2020. -No chest pain presently   SVT history -Continue amiodarone  200 mg daily -remained in sinus during the hositalization   Essential hypertension -Restart metoprolol succinate -Holding hydralazine and doxazosin for soft blood pressures  initially   Esophageal adenocarcinoma (HCC) Follows GI at [facility], Dr. [name] --considered poor candidate for esophagectomy. After multidisciplinary discussion the plan was to continue with endoscopic treatments, and continue to follow-up with oncology for surveillance and any signs of progression of disease -status post EMR, cryotherapy, and most recently RFA in April 2023 -02/10/22 PET scan--no local recurrence          Pain control - [facility] Controlled Substance Reporting System database was reviewed. and patient was instructed, not to drive, operate heavy machinery, perform activities at heights, swimming or participation in water activities or provide baby-sitting services while on Pain, Sleep and Anxiety Medications; until their outpatient Physician has advised to do so again. Also recommended to not to take more than prescribed Pain, Sleep and Anxiety Medications.  Consultants: renal, GI Procedures performed: EGD x 2  Disposition: Home Diet recommendation:  Renal diet DISCHARGE MEDICATION: Allergies as of 05/31/2022       Reactions   Bee Venom Anaphylaxis   Atorvastatin Other (See Comments)   myalgia   Diltiazem Itching   Reglan [metoclopramide] Other (See Comments)   Suicidal    Rosuvastatin Other (See Comments)   myalgias   Valsartan Itching        Medication List     STOP taking these medications    Aspercreme Lidocaine 4 % Generic drug: lidocaine   carvedilol 12.5 MG tablet Commonly known as: COREG   NovoLOG 100 UNIT/ML injection Generic drug: insulin aspart       TAKE these medications    acetaminophen 325 MG tablet Commonly known as: TYLENOL Take 2 tablets (650 mg total) by mouth every 6 (six) hours as needed for mild pain, moderate pain or fever.   albuterol 108 (90 Base) MCG/ACT inhaler Commonly known as: VENTOLIN HFA Inhale 2 puffs into the lungs every 6 (six) hours as needed. What changed: reasons to take this   amiodarone 200 MG  tablet Commonly known as: PACERONE Take 1 tablet (200 mg total) by mouth daily. 200 mg once daily   ARIPiprazole 5 MG tablet Commonly known as: ABILIFY Take 5 mg by mouth daily.   ascorbic acid 500 MG tablet Commonly known as: VITAMIN C Take 1 tablet (500 mg total) by mouth daily.   aspirin EC 325 MG tablet Take 325 mg by mouth daily.   calcium acetate 667 MG capsule Commonly known as: PHOSLO Take 667 mg by mouth in the morning, at noon, and at bedtime.   calcium-vitamin D 500-5 MG-MCG tablet Commonly known as: OSCAL WITH D Take 1 tablet by mouth 2 (two) times daily.   doxazosin 2 MG tablet Commonly known as: CARDURA Take 1 tablet (2 mg total) by mouth daily.   EPINEPHrine 0.3 mg/0.3 mL Soaj injection Commonly known as: EPI-PEN Inject 0.3 mg into the muscle as needed for anaphylaxis.   feeding supplement (NEPRO CARB STEADY) Liqd Take 237 mLs by mouth 2 (two) times daily with a meal.   hydrALAZINE 50 MG tablet Commonly known as: APRESOLINE TAKE 1 TABLET BY MOUTH EVERY 8 HOURS   isosorbide dinitrate 30 MG tablet Commonly known as: ISORDIL Take 30 mg by mouth 3 (three) times daily.   Lantus 100 UNIT/ML injection Generic drug: insulin glargine 8 mLs daily. Sliding scale   metoprolol succinate 50 MG 24 hr tablet Commonly known as: TOPROL-XL TAKE 1  TABLET BY MOUTH DAILY with OR immediately AFTER a meal What changed: See the new instructions.   mupirocin ointment 2 % Commonly known as: BACTROBAN Apply thin amount twice daily to open wounds on the legs   nitroGLYCERIN 0.4 MG SL tablet Commonly known as: NITROSTAT Place 1 tablet (0.4 mg total) under the tongue every 5 (five) minutes as needed for chest pain.   ondansetron 4 MG disintegrating tablet Commonly known as: ZOFRAN-ODT TAKE 1 TABLET BY MOUTH EVERY 8 HOURS AS NEEDED FOR NAUSEA AND VOMITING   pantoprazole 40 MG tablet Commonly known as: PROTONIX Take 1 tablet (40 mg total) by mouth 2 (two) times daily.    Repatha SureClick 408 MG/ML Soaj Generic drug: Evolocumab Inject 140 mg as directed every 14 (fourteen) days. What changed: additional instructions   sertraline 100 MG tablet Commonly known as: ZOLOFT 1 daily What changed:  how much to take how to take this when to take this additional instructions   Theratrum Complete Tabs Take 1 tablet by mouth daily.               Durable Medical Equipment  (From admission, onward)           Start     Ordered   05/31/22 1402  For home use only DME oxygen  Once       Comments: Dx CHF  Question Answer Comment  Length of Need 6 Months   Mode or (Route) Nasal cannula   Liters per Minute 2   Frequency Continuous (stationary and portable oxygen unit needed)   Oxygen conserving device Yes   Oxygen delivery system Gas      05/31/22 1401            Follow-up Information     [facility].   Specialty: Home Health Services Why: RN/PT will call to schedule your next visit. Contact information: [place] [phone]                Discharge Exam: Filed Weights   05/31/22 0812 05/31/22 1225 05/31/22 1630  Weight: 81.3 kg 81.3 kg 79.8 kg   HEENT:  [place]/AT, No thrush, no icterus CV:  RRR, no rub, no S3, no S4 Lung:  bibasilar rales. No wheeze Abd:  soft/+BS, NT Ext:  No edema, no lymphangitis, no synovitis, no rash   Condition at discharge: stable  The results of significant diagnostics from this hospitalization (including imaging, microbiology, ancillary and laboratory) are listed below for reference.   Imaging Studies: CT CHEST ABDOMEN PELVIS WO CONTRAST  Result Date: 05/21/2022 CLINICAL DATA:  Malignant neoplasm of stomach; adenocarcinoma of esophagus. * Tracking Code: BO * EXAM: CT CHEST, ABDOMEN AND PELVIS WITHOUT CONTRAST TECHNIQUE: Multidetector CT imaging of the chest, abdomen and pelvis was performed following the standard protocol without  IV contrast. RADIATION DOSE REDUCTION: This exam was performed according to the departmental dose-optimization program which includes automated exposure control, adjustment of the mA and/or kV according to patient size and/or use of iterative reconstruction technique. COMPARISON:  Chest CTA 05/03/2022.  Abdominopelvic CT 03/03/2022. FINDINGS: CT CHEST FINDINGS Cardiovascular: Atherosclerosis of the aorta, great vessels and coronary arteries status post median sternotomy and CABG. A right IJ central venous catheter projects to the upper right atrium, unchanged. Stable mild cardiomegaly. No significant pericardial fluid. Mediastinum/Nodes: There are no enlarged mediastinal, hilar or axillary lymph nodes. Hilar assessment is limited by the lack of intravenous contrast, although the hilar  contours appear unchanged. The thyroid gland, trachea and esophagus demonstrate no significant findings. Lungs/Pleura: Moderate-sized dependent right pleural effusion is similar to the recent prior examination. Interval improved aeration of the right lower lobe with residual atelectasis. The aeration of the right upper and middle lobes has also improved. The left lung is clear. No suspicious pulmonary nodules. Musculoskeletal/Chest wall: No chest wall mass or suspicious osseous findings. Several nonacute right-sided rib fractures appear unchanged. CT ABDOMEN AND PELVIS FINDINGS Hepatobiliary: No focal hepatic abnormalities on noncontrast imaging. No evidence of gallstones, gallbladder wall thickening or biliary dilatation. Pancreas: Unremarkable. No pancreatic ductal dilatation or surrounding inflammatory changes. Spleen: Normal in size without focal abnormality. Adrenals/Urinary Tract: Both adrenal glands appear normal. No evidence of urinary tract calculus, suspicious renal lesion or hydronephrosis. Unchanged same nephric soft tissue stranding bilaterally and low-density renal lesions consistent with cysts; no follow-up imaging  recommended. The bladder appears unremarkable for its degree of distention. Stomach/Bowel: No enteric contrast administered. No obvious mass or concerning thickening of the distal esophagus or stomach identified. No bowel wall thickening, distention or surrounding inflammation. The appendix appears normal. There is diverticulosis of the descending and sigmoid colon. Vascular/Lymphatic: There are no enlarged abdominal or pelvic lymph nodes. Aortic and branch vessel atherosclerosis without evidence of aneurysm. Reproductive: Stable mild enlargement of the prostate gland. Other: Small periumbilical hernia containing only fat. No ascites, peritoneal nodularity or free air. Musculoskeletal: No acute or significant osseous findings. Mild lumbar spondylosis. IMPRESSION: 1. No evidence of metastatic disease within the chest, abdomen or pelvis on noncontrast imaging. No obvious lesion of the esophagus or stomach on noncontrast imaging. 2. Interval improved aeration of the right lung with persistent moderate-sized right pleural effusion. 3. Distal colonic diverticulosis without acute inflammation. 4.  Aortic Atherosclerosis (ICD10-I70.0). Electronically Signed   By: Richardean Sale M.D.   On: 05/21/2022 14:26   CT Angio Chest PE W and/or Wo Contrast  Result Date: 05/03/2022 CLINICAL DATA:  Pulmonary embolism (PE) suspected, low to intermediate prob, positive D-dimer Pt arrived via [facility] c/o 3 hrs and 5 mins of dialysis treatment when he had a sudden onset of Nausea and hypertension. EXAM: CT ANGIOGRAPHY CHEST WITH CONTRAST TECHNIQUE: Multidetector CT imaging of the chest was performed using the standard protocol during bolus administration of intravenous contrast. Multiplanar CT image reconstructions and MIPs were obtained to evaluate the vascular anatomy. RADIATION DOSE REDUCTION: This exam was performed according to the departmental dose-optimization program which includes automated exposure control, adjustment of the  mA and/or kV according to patient size and/or use of iterative reconstruction technique. CONTRAST:  80m OMNIPAQUE IOHEXOL 350 MG/ML SOLN COMPARISON:  Chest XR, concurrent.  CTA chest, 02/28/2022. FINDINGS: Suboptimal evaluation, secondary to motion degradation. Cardiovascular: Satisfactory opacification of the pulmonary arteries to the segmental level. No segmental or larger pulmonary embolus. Dilated main PA, measuring up to 4.2 cm. Multi chamber cardiac enlargement. No pericardial effusion. 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 RIGHT jugular approach dialysis catheter, well-positioned with tip within the proximal RIGHT atrium. Mediastinum/Nodes: No enlarged mediastinal, hilar, or axillary lymph nodes. Thyroid gland, trachea, and esophagus demonstrate no significant findings. Lungs/Pleura: Small volume RIGHT pleural effusion with adjacent dependent atelectasis. Mild bilateral pulmonary interstitial thickening with trace ground-glass opacities. No suspicious pulmonary nodule or mass. No pneumothorax. Upper Abdomen: No acute abnormality. Musculoskeletal: Postsurgical changes of median sternotomy. No acute chest wall abnormality. No significant osseous findings. Review of the MIP images confirms the above findings. IMPRESSION: Suboptimal evaluation, within these  constraints; 1.  No segmental or larger pulmonary embolus. 2. Main pulmonary artery dilation, likely reflecting underlying pulmonary arterial hypertension. 3. Cardiomegaly, small volume RIGHT pleural effusion and interstitial thickening. Findings suspicious for early/developing pulmonary edema. Additional incidental, chronic and senescent findings as above. Electronically Signed   By: Michaelle Birks M.D.   On: 05/03/2022 17:06   DG Chest Port 1 View  Result Date: 05/03/2022 CLINICAL DATA:  Shortness of breath, onset of nausea and hypertension during dialysis treatment EXAM: PORTABLE CHEST 1 VIEW  COMPARISON:  Portable exam 1420 hours compared to 03/03/2022 FINDINGS: RIGHT jugular central venous catheter with tip projecting over RIGHT atrium. Enlargement of cardiac silhouette with slight vascular congestion post CABG. RIGHT pleural effusion and mild RIGHT basilar atelectasis seen. Accentuation of interstitial markings similar to prior exam. Linear atelectasis RIGHT upper lobe unchanged. No pneumothorax or acute osseous findings. IMPRESSION: Enlargement of cardiac silhouette with vascular congestion. Small RIGHT pleural effusion and basilar atelectasis with persistent accentuation of interstitial markings and linear atelectasis in RIGHT upper lobe. Electronically Signed   By: Lavonia Dana M.D.   On: 05/03/2022 14:34    Microbiology: Results for orders placed or performed during the hospital encounter of 05/27/22  MRSA Next Gen by PCR, Nasal     Status: None   Collection Time: 05/27/22  5:00 PM   Specimen: Nasal Mucosa; Nasal Swab  Result Value Ref Range Status   MRSA by PCR Next Gen NOT DETECTED NOT DETECTED Final    Comment: (NOTE) The GeneXpert MRSA Assay (FDA approved for NASAL specimens only), is one component of a comprehensive MRSA colonization surveillance program. It is not intended to diagnose MRSA infection nor to guide or monitor treatment for MRSA infections. Test performance is not FDA approved in patients less than 72 years old. Performed at [facility], [address], [place], [place] [address]     Labs: CBC: Recent Labs  Lab 05/27/22 1138 05/27/22 1200 05/28/22 0354 05/28/22 1353 05/29/22 0448 05/29/22 1301 05/29/22 2104 05/30/22 0336 05/31/22 0512  WBC 8.6  --  7.1  --   --   --   --   --  6.4  NEUTROABS 6.9  --   --   --   --   --   --   --   --   HGB 12.1*   < > 10.4*   < > 9.8* 9.4* 9.5* 10.0* 10.3*  HCT 39.9   < > 33.8*   < > 31.7* 30.8* 30.5* 32.2* 33.4*  MCV 88.3  --  88.3  --   --   --   --   --  87.9  PLT 244  --  246  --   --   --   --   --  203    < > = values in this interval not displayed.   Basic Metabolic Panel: Recent Labs  Lab 05/27/22 1138 05/27/22 1200 05/27/22 1254 05/28/22 0354 05/31/22 1316  NA 135 133* 133* 138 135  K 4.5 4.8 5.5* 4.2 4.3  CL 96* 99 98 105 87*  CO2 24  --   --  22 41*  GLUCOSE 231* 233* 230* 131* 122*  BUN 66* 69* 85* 74* 17  CREATININE 4.84* 5.50* 5.50* 5.36* 0.68  CALCIUM 8.6*  --   --  8.3* 9.1  PHOS  --   --   --   --  3.1   Liver Function Tests: Recent Labs  Lab 05/27/22 1138 05/28/22 0354  05/31/22 1316  AST 15 8*  --   ALT 17 12  --   ALKPHOS 111 84  --   BILITOT 1.1 0.7  --   PROT 7.0 6.0*  --   ALBUMIN 3.7 3.1* 3.2*   CBG: Recent Labs  Lab 05/30/22 1550 05/30/22 2055 05/31/22 0718 05/31/22 1134 05/31/22 1656  GLUCAP 156* 231* 158* 177* 93    Discharge time spent: greater than 30 minutes.  Signed: Orson Eva, MD [facility] 05/31/2022

## 2022-05-30 NOTE — Progress Notes (Signed)
Pt CG [name] notified of move to room 340

## 2022-05-30 NOTE — Transfer of Care (Signed)
Immediate Anesthesia Transfer of Care Note  Patient: [name]  Procedure(s) Performed: ESOPHAGOGASTRODUODENOSCOPY (EGD) WITH PROPOFOL BIOPSY  Patient Location: PACU  Anesthesia Type:General  Level of Consciousness: awake, alert , oriented, and patient cooperative  Airway & Oxygen Therapy: Patient Spontanous Breathing and Patient connected to nasal cannula oxygen  Post-op Assessment: Report given to RN, Post -op Vital signs reviewed and stable, and Patient moving all extremities  Post vital signs: Reviewed and stable  Last Vitals:  Vitals Value Taken Time  BP    Temp    Pulse 68 05/30/22 1134  Resp 22 05/30/22 1134  SpO2 89 % 05/30/22 1134  Vitals shown include unvalidated device data.  Last Pain:  Vitals:   05/30/22 1117  TempSrc:   PainSc: 0-No pain      Patients Stated Pain Goal: 5 (67/70/34 0352)  Complications: No notable events documented.

## 2022-05-30 NOTE — Progress Notes (Signed)
PROGRESS NOTE  [name] BWG:[id number] DOB: 1955/12/02 DOA: 05/27/2022 PCP: [name], MD  Brief History:  66 year old male with a history of coronary disease status post CABG, systolic and diastolic CHF, diabetes mellitus type 2, hypertension, ESRD, SVT, esophageal adenocarcinoma, and OSA presenting with hematemesis during his dialysis session earlier today on 05/27/2022.  The patient actually states that he had an episode of hematemesis on the evening of 05/26/2022.  The patient was recently admitted to the hospital from 05/03/2022 to 05/05/2022 secondary to Enterobacter cloacae bacteremia.  He was treated with cefepime on dialysis.  He was also fluid overloaded during that admission.  He was discharged in stable condition to complete a course of cefepime. His dialysis catheter was removed on 05/24/2022 at [facility], and a new dialysis catheter was placed on the same day. He was at [facility] to have his usual surveillance EGD on 05/26/2022, but ended up having SVT and hypotension.  He was transferred to the ED.  He stabilized and was discharged home.  Notably, the patient was recently admitted to the hospital The patient had been in his usual state of health until yesterday, 05/26/2022.  Apparently he was going for an EGD, but he was noted to have SVT with heart rate in the 160s.  It was canceled.  The patient denies any NSAIDs. He only completed 30 minutes of his dialysis on 05/27/2022. Patient denies fevers, chills, headache, chest pain, dyspnea, nausea, vomiting, diarrhea, abdominal pain, dysuria, hematuria, hematochezia, and melena. In the ED, the patient was afebrile hemodynamically stable with oxygen saturation 100% room air.  WBC 8.6, hemoglobin 12.1, platelets 244,000.  Sodium 135, potassium 4.5, bicarbonate 24, serum creatinine 4.4.  LFTs were unremarkable.  CT of the abdomen and chest did not show any metastasis.  There is no obvious mass in the  esophagus.  There is interval improvement of aeration in the right lung with persistent right pleural effusion.  There is no bowel wall thickening.  Left lung is clear.  GI was consulted to assist with management.   Assessment/Plan:  Hematemesis/Acute Blood Loss Anemia -GI consulted -continue pantoprazole drip -12/15 EGD clotted blood in the entire esophagus, clots and old blood in the stomach.  No active bleeding noted. -Hgb 13.3>>10.1>>9.4>>10.0 -repeat EGD 12/18--no active bleeding or stigmata of bleeding; Moderately severe esophagitis with no bleeding;   Esophageal mucosal changes secondary to established short-segment Barrett's disease -transition to bid pantoprazole -advance diet   Chronic combined systolic and diastolic CHF -Clinically euvolemic -07/25/21 Echo--EF 40-45%, no WMA, mod RV dysfx, PASP 56.5  -03/01/22 Echo--EF 40-45%, global HK, RV overload  -Fluid removal via dialysis   ESRD -He dialyzes on Monday, Wednesday, Friday -Consult nephrology for maintenance dialysis -He was started on dialysis sometime in August 2023 -Nephrology consulted for maintenance dialysis -dialyzed on 12/16 -plan next HD 12/19   Right pleural effusion -This has been chronic -He has had previous thoracocentesis in the past, last done 03/01/2022 -stable on RA   S/P CABG (coronary artery bypass graft) Recent cardiac cath 01/2021, showed patent grafts, severe three-vessel native CAD.Marland Kitchen  History of CABG 2020. -No chest pain presently   SVT history -Continue amiodarone 200 mg daily   Essential hypertension -Restart metoprolol succinate -Holding hydralazine and doxazosin for soft blood pressures initially   Esophageal adenocarcinoma (HCC) Follows GI at [facility], Dr. [name] --considered poor candidate for esophagectomy. After multidisciplinary discussion the plan was to continue with  endoscopic treatments, and continue to follow-up with oncology for surveillance and any signs of progression of  disease -status post EMR, cryotherapy, and most recently RFA in April 2023 -02/10/22 PET scan--no local recurrence                     Family Communication:  significant other updated 12/18   Consultants:  GI   Code Status:  DNR   DVT Prophylaxis:  SCDs     Procedures: As Listed in Progress Note Above   Antibiotics: None    Subjective: Patient denies fevers, chills, headache, chest pain, dyspnea, nausea, vomiting, diarrhea, abdominal pain, dysuria, hematuria, hematochezia, and melena.   Objective: Vitals:   05/30/22 1145 05/30/22 1200 05/30/22 1223 05/30/22 1232  BP: (!) 113/58   (!) 133/57  Pulse: 67 69 70   Resp: 20 (!) 21 19   Temp:      TempSrc:      SpO2:  99% 95%   Weight:      Height:        Intake/Output Summary (Last 24 hours) at 05/30/2022 1330 Last data filed at 05/30/2022 1133 Gross per 24 hour  Intake 471.68 ml  Output 160 ml  Net 311.68 ml   Weight change: -3.6 kg Exam:  General:  Pt is alert, follows commands appropriately, not in acute distress HEENT: No icterus, No thrush, No neck mass, [place]/AT Cardiovascular: RRR, S1/S2, no rubs, no gallops Respiratory: CTA bilaterally, no wheezing, no crackles, no rhonchi Abdomen: Soft/+BS, non tender, non distended, no guarding Extremities: No edema, No lymphangitis, No petechiae, No rashes, no synovitis   Data Reviewed: I have personally reviewed following labs and imaging studies Basic Metabolic Panel: Recent Labs  Lab 05/27/22 1138 05/27/22 1200 05/27/22 1254 05/28/22 0354  NA 135 133* 133* 138  K 4.5 4.8 5.5* 4.2  CL 96* 99 98 105  CO2 24  --   --  22  GLUCOSE 231* 233* 230* 131*  BUN 66* 69* 85* 74*  CREATININE 4.84* 5.50* 5.50* 5.36*  CALCIUM 8.6*  --   --  8.3*   Liver Function Tests: Recent Labs  Lab 05/27/22 1138 05/28/22 0354  AST 15 8*  ALT 17 12  ALKPHOS 111 84  BILITOT 1.1 0.7  PROT 7.0 6.0*  ALBUMIN 3.7 3.1*   No results for input(s): "LIPASE", "AMYLASE" in  the last 168 hours. No results for input(s): "AMMONIA" in the last 168 hours. Coagulation Profile: No results for input(s): "INR", "PROTIME" in the last 168 hours. CBC: Recent Labs  Lab 05/27/22 1138 05/27/22 1200 05/28/22 0354 05/28/22 1353 05/28/22 2219 05/29/22 0448 05/29/22 1301 05/29/22 2104 05/30/22 0336  WBC 8.6  --  7.1  --   --   --   --   --   --   NEUTROABS 6.9  --   --   --   --   --   --   --   --   HGB 12.1*   < > 10.4*   < > 9.7* 9.8* 9.4* 9.5* 10.0*  HCT 39.9   < > 33.8*   < > 31.7* 31.7* 30.8* 30.5* 32.2*  MCV 88.3  --  88.3  --   --   --   --   --   --   PLT 244  --  246  --   --   --   --   --   --    < > = values  in this interval not displayed.   Cardiac Enzymes: No results for input(s): "CKTOTAL", "CKMB", "CKMBINDEX", "TROPONINI" in the last 168 hours. BNP: Invalid input(s): "POCBNP" CBG: Recent Labs  Lab 05/29/22 2059 05/30/22 0429 05/30/22 0732 05/30/22 1041 05/30/22 1221  GLUCAP 221* 131* 168* 145* 146*   HbA1C: No results for input(s): "HGBA1C" in the last 72 hours. Urine analysis:    Component Value Date/Time   COLORURINE YELLOW 02/28/2022 1414   APPEARANCEUR CLEAR 02/28/2022 1414   LABSPEC 1.014 02/28/2022 1414   PHURINE 5.0 02/28/2022 1414   GLUCOSEU >=500 (A) 02/28/2022 1414   HGBUR NEGATIVE 02/28/2022 1414   BILIRUBINUR NEGATIVE 02/28/2022 1414   KETONESUR NEGATIVE 02/28/2022 1414   PROTEINUR >=300 (A) 02/28/2022 1414   NITRITE NEGATIVE 02/28/2022 1414   LEUKOCYTESUR NEGATIVE 02/28/2022 1414   Sepsis Labs: '@LABRCNTIP'$ (procalcitonin:4,lacticidven:4) ) Recent Results (from the past 240 hour(s))  MRSA Next Gen by PCR, Nasal     Status: None   Collection Time: 05/27/22  5:00 PM   Specimen: Nasal Mucosa; Nasal Swab  Result Value Ref Range Status   MRSA by PCR Next Gen NOT DETECTED NOT DETECTED Final    Comment: (NOTE) The GeneXpert MRSA Assay (FDA approved for NASAL specimens only), is one component of a comprehensive MRSA  colonization surveillance program. It is not intended to diagnose MRSA infection nor to guide or monitor treatment for MRSA infections. Test performance is not FDA approved in patients less than 45 years old. Performed at [facility], [address] [place], [place] [address]      Scheduled Meds:  amiodarone  200 mg Oral Daily   ARIPiprazole  5 mg Oral Daily   [START ON 05/31/2022] calcitRIOL  0.5 mcg Oral Q T,Th,Sa-HD   calcium acetate  667 mg Oral TID WC   Chlorhexidine Gluconate Cloth  6 each Topical Q0600   Chlorhexidine Gluconate Cloth  6 each Topical Q0600   insulin aspart  0-6 Units Subcutaneous Q4H   isosorbide dinitrate  30 mg Oral TID   metoprolol succinate  50 mg Oral Daily   ondansetron (ZOFRAN) IV  4 mg Intravenous Q8H   [START ON 05/31/2022] pantoprazole  40 mg Intravenous Q12H   sertraline  100 mg Oral Daily   Continuous Infusions:  pantoprazole 8 mg/hr (05/29/22 0950)    Procedures/Studies: CT CHEST ABDOMEN PELVIS WO CONTRAST  Result Date: 05/21/2022 CLINICAL DATA:  Malignant neoplasm of stomach; adenocarcinoma of esophagus. * Tracking Code: BO * EXAM: CT CHEST, ABDOMEN AND PELVIS WITHOUT CONTRAST TECHNIQUE: Multidetector CT imaging of the chest, abdomen and pelvis was performed following the standard protocol without IV contrast. RADIATION DOSE REDUCTION: This exam was performed according to the departmental dose-optimization program which includes automated exposure control, adjustment of the mA and/or kV according to patient size and/or use of iterative reconstruction technique. COMPARISON:  Chest CTA 05/03/2022.  Abdominopelvic CT 03/03/2022. FINDINGS: CT CHEST FINDINGS Cardiovascular: Atherosclerosis of the aorta, great vessels and coronary arteries status post median sternotomy and CABG. A right IJ central venous catheter projects to the upper right atrium, unchanged. Stable mild cardiomegaly. No significant pericardial fluid. Mediastinum/Nodes: There are no  enlarged mediastinal, hilar or axillary lymph nodes. Hilar assessment is limited by the lack of intravenous contrast, although the hilar contours appear unchanged. The thyroid gland, trachea and esophagus demonstrate no significant findings. Lungs/Pleura: Moderate-sized dependent right pleural effusion is similar to the recent prior examination. Interval improved aeration of the right lower lobe with residual atelectasis. The aeration of the right upper  and middle lobes has also improved. The left lung is clear. No suspicious pulmonary nodules. Musculoskeletal/Chest wall: No chest wall mass or suspicious osseous findings. Several nonacute right-sided rib fractures appear unchanged. CT ABDOMEN AND PELVIS FINDINGS Hepatobiliary: No focal hepatic abnormalities on noncontrast imaging. No evidence of gallstones, gallbladder wall thickening or biliary dilatation. Pancreas: Unremarkable. No pancreatic ductal dilatation or surrounding inflammatory changes. Spleen: Normal in size without focal abnormality. Adrenals/Urinary Tract: Both adrenal glands appear normal. No evidence of urinary tract calculus, suspicious renal lesion or hydronephrosis. Unchanged same nephric soft tissue stranding bilaterally and low-density renal lesions consistent with cysts; no follow-up imaging recommended. The bladder appears unremarkable for its degree of distention. Stomach/Bowel: No enteric contrast administered. No obvious mass or concerning thickening of the distal esophagus or stomach identified. No bowel wall thickening, distention or surrounding inflammation. The appendix appears normal. There is diverticulosis of the descending and sigmoid colon. Vascular/Lymphatic: There are no enlarged abdominal or pelvic lymph nodes. Aortic and branch vessel atherosclerosis without evidence of aneurysm. Reproductive: Stable mild enlargement of the prostate gland. Other: Small periumbilical hernia containing only fat. No ascites, peritoneal nodularity  or free air. Musculoskeletal: No acute or significant osseous findings. Mild lumbar spondylosis. IMPRESSION: 1. No evidence of metastatic disease within the chest, abdomen or pelvis on noncontrast imaging. No obvious lesion of the esophagus or stomach on noncontrast imaging. 2. Interval improved aeration of the right lung with persistent moderate-sized right pleural effusion. 3. Distal colonic diverticulosis without acute inflammation. 4.  Aortic Atherosclerosis (ICD10-I70.0). Electronically Signed   By: Richardean Sale M.D.   On: 05/21/2022 14:26   CT Angio Chest PE W and/or Wo Contrast  Result Date: 05/03/2022 CLINICAL DATA:  Pulmonary embolism (PE) suspected, low to intermediate prob, positive D-dimer Pt arrived via [facility] c/o 3 hrs and 5 mins of dialysis treatment when he had a sudden onset of Nausea and hypertension. EXAM: CT ANGIOGRAPHY CHEST WITH CONTRAST TECHNIQUE: Multidetector CT imaging of the chest was performed using the standard protocol during bolus administration of intravenous contrast. Multiplanar CT image reconstructions and MIPs were obtained to evaluate the vascular anatomy. RADIATION DOSE REDUCTION: This exam was performed according to the departmental dose-optimization program which includes automated exposure control, adjustment of the mA and/or kV according to patient size and/or use of iterative reconstruction technique. CONTRAST:  62m OMNIPAQUE IOHEXOL 350 MG/ML SOLN COMPARISON:  Chest XR, concurrent.  CTA chest, 02/28/2022. FINDINGS: Suboptimal evaluation, secondary to motion degradation. Cardiovascular: Satisfactory opacification of the pulmonary arteries to the segmental level. No segmental or larger pulmonary embolus. Dilated main PA, measuring up to 4.2 cm. Multi chamber cardiac enlargement. No pericardial effusion. 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 RIGHT jugular approach dialysis catheter, well-positioned with tip within the  proximal RIGHT atrium. Mediastinum/Nodes: No enlarged mediastinal, hilar, or axillary lymph nodes. Thyroid gland, trachea, and esophagus demonstrate no significant findings. Lungs/Pleura: Small volume RIGHT pleural effusion with adjacent dependent atelectasis. Mild bilateral pulmonary interstitial thickening with trace ground-glass opacities. No suspicious pulmonary nodule or mass. No pneumothorax. Upper Abdomen: No acute abnormality. Musculoskeletal: Postsurgical changes of median sternotomy. No acute chest wall abnormality. No significant osseous findings. Review of the MIP images confirms the above findings. IMPRESSION: Suboptimal evaluation, within these constraints; 1.  No segmental or larger pulmonary embolus. 2. Main pulmonary artery dilation, likely reflecting underlying pulmonary arterial hypertension. 3. Cardiomegaly, small volume RIGHT pleural effusion and interstitial thickening. Findings suspicious for early/developing pulmonary edema. Additional incidental, chronic and senescent findings as  above. Electronically Signed   By: Michaelle Birks M.D.   On: 05/03/2022 17:06   DG Chest Port 1 View  Result Date: 05/03/2022 CLINICAL DATA:  Shortness of breath, onset of nausea and hypertension during dialysis treatment EXAM: PORTABLE CHEST 1 VIEW COMPARISON:  Portable exam 1420 hours compared to 03/03/2022 FINDINGS: RIGHT jugular central venous catheter with tip projecting over RIGHT atrium. Enlargement of cardiac silhouette with slight vascular congestion post CABG. RIGHT pleural effusion and mild RIGHT basilar atelectasis seen. Accentuation of interstitial markings similar to prior exam. Linear atelectasis RIGHT upper lobe unchanged. No pneumothorax or acute osseous findings. IMPRESSION: Enlargement of cardiac silhouette with vascular congestion. Small RIGHT pleural effusion and basilar atelectasis with persistent accentuation of interstitial markings and linear atelectasis in RIGHT upper lobe.  Electronically Signed   By: Lavonia Dana M.D.   On: 05/03/2022 14:34    Orson Eva, DO  [facility]  If 7PM-7AM, please contact night-coverage [email or web address] Password [email or web address] 05/30/2022, 1:30 PM   LOS: 2 days

## 2022-05-30 NOTE — Consult Note (Addendum)
Reason for Consult: To manage dialysis and dialysis related needs Referring Physician: Tat  [name] is a 66 year old WM with CAD, combined CHF, DM, HTN, esophageal adenocarcinoma and ESRD with recent bacteremia s/p TDC exchange on 12/12-  apparently finished with abx.  He was due for a surveillance EGD for his cancer on 12/14 but procedure was cancelled due to SVT.  He presents to the hospital this time with hematemesis-  underwent EGD found blood but no active bleeding-  placed on protonix drip-  he underwent HD here on Saturday off schedule.  Hbg appears to be stable last 48 hours.  Per patient they are doing another EGD today    Dialyzes at [facility]  MWF 4 hours  EDW 83. HD [place] 2/2.5, Dialyzer 180, Heparin yes. Access [facility]-  but has AVF.  Calcitriol 0.5 TIW  Past Medical History:  Diagnosis Date   Anemia    Arthritis    hips shoulders   CAD (coronary artery disease) 03/03/2019   Cancer ([place])    Esophageal cancer 2022   CHF (congestive heart failure) ([place]) 2020   Chronic combined systolic and diastolic heart failure ([place]) 04/26/2018   Admitted 11/14-11/20/19-diuresed 10L   Depression 05/31/2021   Fatigue 10/14/2020   Hypertension    Myocardial infarction [facility])    Sleep apnea    uses a bipap machine   Stroke ([place]) 12/14/2020   no last weakness or paralysis   Type 2 diabetes mellitus with diabetic nephropathy ([place]) 04/23/2019   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    Past Surgical History:  Procedure Laterality Date   AV FISTULA PLACEMENT Left 12/21/2021   Procedure: LEFT ARM ARTERIOVENOUS (AV) FISTULA CREATION;  Surgeon: Rosetta Posner, MD;  Location: [facility];  Service: Vascular;  Laterality: Left;   BIOPSY  06/27/2019   Procedure: BIOPSY;  Surgeon: Rogene Houston, MD;  [facility];  Service: Endoscopy;;  ascending colon polyp   BIOPSY  10/21/2020   Procedure: BIOPSY;  Surgeon: Rogene Houston, MD;  [facility];  Service: Endoscopy;;   esophageal,gastric polyp   BIOPSY  02/24/2021   Procedure: BIOPSY;  Surgeon: Rogene Houston, MD;  [facility];  Service: Endoscopy;;  distal and proximal esophageal biopsies    CARDIAC SURGERY     CATARACT EXTRACTION     COLONOSCOPY N/A 06/27/2019   [name]:Diverticulosis in the entire examined colon. tubular adenoma in ascending colon, 88m tubular adenoma in prox sigmoid. external hemorrhoids   CORONARY ARTERY BYPASS GRAFT N/A 03/25/2019   Procedure: CORONARY ARTERY BYPASS GRAFTING (CABG) X  4 USING LEFT INTERNAL MAMMARY ARTERY AND RIGHT SAPHENOUS VEIN GRAFTS;  Surgeon: LLajuana Matte MD;  [place]  Service: Open Heart Surgery;  Laterality: N/A;   ESOPHAGEAL BRUSHING  03/08/2022   Procedure: ESOPHAGEAL BRUSHING;  Surgeon: CEloise Harman DO;  [facility];  Service: Endoscopy;;   ESOPHAGOGASTRODUODENOSCOPY (EGD) WITH PROPOFOL N/A 10/21/2020   rehman:Normal hypopharynx.Normal proximal esophagus and mid esophagus.Esophageal mucosal changes consistent with long-segment Barrett's esophagus. (Focal low-grade dysplasia and atypia proximally) z line irregular 37 cm from incisors, 3 cm HH, single gastric polyp (fundic gland) normal duodenal bulb/second portion of duodenum, proximal margin of Barrett's at 34 cm   ESOPHAGOGASTRODUODENOSCOPY (EGD) WITH PROPOFOL N/A 02/24/2021   Procedure: ESOPHAGOGASTRODUODENOSCOPY (EGD) WITH PROPOFOL;  Surgeon: RRogene Houston MD;  [facility];  Service: Endoscopy;  Laterality: N/A;  1:35   ESOPHAGOGASTRODUODENOSCOPY (EGD) WITH PROPOFOL  N/A 03/21/2021   Procedure: ESOPHAGOGASTRODUODENOSCOPY (EGD) WITH PROPOFOL;  Surgeon: Carol Ada, MD;  Location: [place];  Service: Endoscopy;  Laterality: N/A;   ESOPHAGOGASTRODUODENOSCOPY (EGD) WITH PROPOFOL N/A 10/23/2021   Procedure: ESOPHAGOGASTRODUODENOSCOPY (EGD) WITH PROPOFOL;  Surgeon: Doran Stabler, MD;  Location: [place];  Service: Gastroenterology;  Laterality:  N/A;   ESOPHAGOGASTRODUODENOSCOPY (EGD) WITH PROPOFOL N/A 03/08/2022   Procedure: ESOPHAGOGASTRODUODENOSCOPY (EGD) WITH PROPOFOL;  Surgeon: Eloise Harman, DO;  [facility];  Service: Endoscopy;  Laterality: N/A;   EXCISION MORTON'S NEUROMA     EYE SURGERY Left    retina   INCISION AND DRAINAGE OF WOUND Right 06/28/2021   Procedure: IRRIGATION AND DEBRIDEMENT WOUND;  Surgeon: [name], MD;  Location: [place];  Service: Plastics;  Laterality: Right;  1.5 hour   INSERTION OF DIALYSIS CATHETER Right 12/21/2021   Procedure: INSERTION OF TUNNELED DIALYSIS CATHETER;  Surgeon: Rosetta Posner, MD;  [facility];  Service: Vascular;  Laterality: Right;   LEFT HEART CATH AND CORS/GRAFTS ANGIOGRAPHY N/A 02/01/2022   Procedure: LEFT HEART CATH AND CORS/GRAFTS ANGIOGRAPHY;  Surgeon: Martinique, Peter M, MD;  Location: [place] [facility];  Service: Cardiovascular;  Laterality: N/A;   POLYPECTOMY  06/27/2019   Procedure: POLYPECTOMY;  Surgeon: Rogene Houston, MD;  [facility];  Service: Endoscopy;;  proximal sigmoid colon   RIGHT/LEFT HEART CATH AND CORONARY ANGIOGRAPHY N/A 03/12/2019   Procedure: RIGHT/LEFT HEART CATH AND CORONARY ANGIOGRAPHY;  Surgeon: [name], MD;  Location: [place] [facility];  Service: Cardiovascular;  Laterality: N/A;   SHOULDER ARTHROSCOPY WITH ROTATOR CUFF REPAIR AND SUBACROMIAL DECOMPRESSION Right 08/26/2020   Procedure: RIGHT SHOULDER MINI OPEN ROTATOR CUFF REPAIR AND SUBACROMIAL DECOMPRESSION WITH PATCH GRAFT;  Surgeon: Susa Day, MD;  Location: [facility];  Service: Orthopedics;  Laterality: Right;  90 MINS GENERAL WITH BLOCK   SKIN SPLIT GRAFT Right 06/28/2021   Procedure: SKIN GRAFT SPLIT THICKNESS;  Surgeon: [name], MD;  Location: [place];  Service: Plastics;  Laterality: Right;   TEE WITHOUT CARDIOVERSION N/A 03/25/2019   Procedure: TRANSESOPHAGEAL ECHOCARDIOGRAM (TEE);  Surgeon: [name], MD;  Location: [place];   Service: Open Heart Surgery;  Laterality: N/A;    Family History  Problem Relation Age of Onset   Colon cancer Mother    Heart Problems Father    Diabetes Father    Valvular heart disease Father    Sleep apnea Neg Hx    Stroke Neg Hx     Social History:  reports that he quit smoking about 33 years ago. His smoking use included cigarettes. He has been exposed to tobacco smoke. He has never used smokeless tobacco. He reports that he does not currently use alcohol after a past usage of about 2.0 standard drinks of alcohol per week. He reports that he does not use drugs.  Allergies:  Allergies  Allergen Reactions   Bee Venom Anaphylaxis   Atorvastatin Other (See Comments)    myalgia   Diltiazem Itching   Reglan [Metoclopramide] Other (See Comments)    Suicidal    Rosuvastatin Other (See Comments)    myalgias   Valsartan Itching    Medications: I have reviewed the patient's current medications.  Results for orders placed or performed during the hospital encounter of 05/27/22 (from the past 48 hour(s))  Glucose, capillary     Status: Abnormal   Collection Time: 05/28/22 11:34 AM  Result Value Ref Range  Glucose-Capillary 152 (H) 70 - 99 mg/dL    Comment: Glucose reference range applies only to samples taken after fasting for at least 8 hours.  Hemoglobin and hematocrit, blood     Status: Abnormal   Collection Time: 05/28/22  1:53 PM  Result Value Ref Range   Hemoglobin 9.8 (L) 13.0 - 17.0 g/dL   HCT 31.7 (L) 39.0 - 52.0 %    Comment: Performed at [facility], [address], [place], [place] [address]  Glucose, capillary     Status: None   Collection Time: 05/28/22  5:08 PM  Result Value Ref Range   Glucose-Capillary 99 70 - 99 mg/dL    Comment: Glucose reference range applies only to samples taken after fasting for at least 8 hours.  Glucose, capillary     Status: Abnormal   Collection Time: 05/28/22  7:48 PM  Result Value Ref Range   Glucose-Capillary 195 (H) 70 - 99  mg/dL    Comment: Glucose reference range applies only to samples taken after fasting for at least 8 hours.   Comment 1 Notify RN    Comment 2 Document in Chart   Glucose, capillary     Status: Abnormal   Collection Time: 05/28/22  9:57 PM  Result Value Ref Range   Glucose-Capillary 177 (H) 70 - 99 mg/dL    Comment: Glucose reference range applies only to samples taken after fasting for at least 8 hours.  Hemoglobin and hematocrit, blood     Status: Abnormal   Collection Time: 05/28/22 10:19 PM  Result Value Ref Range   Hemoglobin 9.7 (L) 13.0 - 17.0 g/dL   HCT 31.7 (L) 39.0 - 52.0 %    Comment: Performed at [facility], [address], [place], [place] [address]  Glucose, capillary     Status: Abnormal   Collection Time: 05/29/22 12:16 AM  Result Value Ref Range   Glucose-Capillary 145 (H) 70 - 99 mg/dL    Comment: Glucose reference range applies only to samples taken after fasting for at least 8 hours.   Comment 1 Notify RN    Comment 2 Document in Chart   Glucose, capillary     Status: Abnormal   Collection Time: 05/29/22  4:14 AM  Result Value Ref Range   Glucose-Capillary 115 (H) 70 - 99 mg/dL    Comment: Glucose reference range applies only to samples taken after fasting for at least 8 hours.   Comment 1 Notify RN    Comment 2 Document in Chart   Hemoglobin and hematocrit, blood     Status: Abnormal   Collection Time: 05/29/22  4:48 AM  Result Value Ref Range   Hemoglobin 9.8 (L) 13.0 - 17.0 g/dL   HCT 31.7 (L) 39.0 - 52.0 %    Comment: Performed at [facility], [address] [place], [place] [address]  Glucose, capillary     Status: Abnormal   Collection Time: 05/29/22  7:39 AM  Result Value Ref Range   Glucose-Capillary 128 (H) 70 - 99 mg/dL    Comment: Glucose reference range applies only to samples taken after fasting for at least 8 hours.  Glucose, capillary     Status: Abnormal   Collection Time: 05/29/22  7:54 AM  Result Value Ref Range   Glucose-Capillary  121 (H) 70 - 99 mg/dL    Comment: Glucose reference range applies only to samples taken after fasting for at least 8 hours.  Glucose, capillary     Status: Abnormal  Collection Time: 05/29/22 11:41 AM  Result Value Ref Range   Glucose-Capillary 151 (H) 70 - 99 mg/dL    Comment: Glucose reference range applies only to samples taken after fasting for at least 8 hours.  Hemoglobin and hematocrit, blood     Status: Abnormal   Collection Time: 05/29/22  1:01 PM  Result Value Ref Range   Hemoglobin 9.4 (L) 13.0 - 17.0 g/dL   HCT 30.8 (L) 39.0 - 52.0 %    Comment: Performed at [facility], [address], [place], [place] [address]  Glucose, capillary     Status: Abnormal   Collection Time: 05/29/22  4:10 PM  Result Value Ref Range   Glucose-Capillary 215 (H) 70 - 99 mg/dL    Comment: Glucose reference range applies only to samples taken after fasting for at least 8 hours.  Glucose, capillary     Status: Abnormal   Collection Time: 05/29/22  8:59 PM  Result Value Ref Range   Glucose-Capillary 221 (H) 70 - 99 mg/dL    Comment: Glucose reference range applies only to samples taken after fasting for at least 8 hours.   Comment 1 Notify RN    Comment 2 Document in Chart   Hemoglobin and hematocrit, blood     Status: Abnormal   Collection Time: 05/29/22  9:04 PM  Result Value Ref Range   Hemoglobin 9.5 (L) 13.0 - 17.0 g/dL   HCT 30.5 (L) 39.0 - 52.0 %    Comment: Performed at [facility], [address], [place], [place] [address]  Hemoglobin and hematocrit, blood     Status: Abnormal   Collection Time: 05/30/22  3:36 AM  Result Value Ref Range   Hemoglobin 10.0 (L) 13.0 - 17.0 g/dL   HCT 32.2 (L) 39.0 - 52.0 %    Comment: Performed at [facility], [address], [place],  [address]  Glucose, capillary     Status: Abnormal   Collection Time: 05/30/22  4:29 AM  Result Value Ref Range   Glucose-Capillary 131 (H) 70 - 99 mg/dL    Comment: Glucose reference range applies only to  samples taken after fasting for at least 8 hours.   Comment 1 Notify RN    Comment 2 Document in Chart   Glucose, capillary     Status: Abnormal   Collection Time: 05/30/22  7:32 AM  Result Value Ref Range   Glucose-Capillary 168 (H) 70 - 99 mg/dL    Comment: Glucose reference range applies only to samples taken after fasting for at least 8 hours.    No results found.  ROS: "I'm OK" Blood pressure (!) 146/60, pulse 71, temperature 98 F (36.7 C), temperature source Oral, resp. rate 13, height '5\' 10"'$  (1.778 m), weight 81.2 kg, SpO2 99 %. General appearance: no distress and pale Resp: clear to auscultation bilaterally Cardio: regular rate and rhythm, S1, S2 normal, no murmur, click, rub or gallop GI: soft, non-tender; bowel sounds normal; no masses,  no organomegaly Extremities: extremities normal, atraumatic, no cyanosis or edema Left AVF-  bruised but patent "they keep missing it" also right [facility]  Assessment/Plan: 66 year old WM with many medical issues including esophageal CA-  presented with hematemesis 1 Hematamesis-  presumably due to his esophageal CA-  GI involved-  have done oe EGD and due for another-  hgb stable last 24 hours- protonix drip 2 ESRD: normally MWF-  last done Saturday off schedule-  many pts to run today so will need  to put off til Tuesday and again run off schedule-  using TDC is seems mostly -  no heparin  3 Hypertension: volume good-  BP adequate-  if anything high- on toprol only  4. Anemia of ESRD: had not been getting ESA as hgb was good as OP-  will resume 5. Metabolic Bone Disease: continue calcitriol-  was on phoslo as OP-  holding for now as is NPO   [name] 05/30/2022, 8:15 AM

## 2022-05-30 NOTE — Interval H&P Note (Signed)
History and Physical Interval Note:  05/30/2022 10:57 AM  [name]  has presented today for surgery, with the diagnosis of hematemesis.  The various methods of treatment have been discussed with the patient and family. After consideration of risks, benefits and other options for treatment, the patient has consented to  Procedure(s): ESOPHAGOGASTRODUODENOSCOPY (EGD) WITH PROPOFOL (N/A) as a surgical intervention.  The patient's history has been reviewed, patient examined, no change in status, stable for surgery.  I have reviewed the patient's chart and labs.  Questions were answered to the patient's satisfaction.     Eloise Harman

## 2022-05-30 NOTE — Op Note (Signed)
[facility] Patient Name: [name] Procedure Date: 05/30/2022 10:57 AM MRN: [id number] Date of Birth: Nov 09, 1955 Attending MD: Elon Alas. Abbey Chatters , [place], [id number] CSN: [id number] Age: 66 Admit Type: Inpatient Procedure:                Upper GI endoscopy Indications:              Acute post hemorrhagic anemia, Hematemesis Providers:                Elon Alas. Abbey Chatters, DO, Caprice Kluver, Crystal Page,                            Everardo Pacific Referring MD:              Medicines:                See the Anesthesia note for documentation of the                            administered medications Complications:            No immediate complications. Estimated Blood Loss:     Estimated blood loss was minimal. Procedure:                Pre-Anesthesia Assessment:                           - The anesthesia plan was to use monitored                            anesthesia care (MAC).                           After obtaining informed consent, the endoscope was                            passed under direct vision. Throughout the                            procedure, the patient's blood pressure, pulse, and                            oxygen saturations were monitored continuously. The                            GIF-H190 ([id number]) scope was introduced through the                            mouth, and advanced to the second part of duodenum.                            The upper GI endoscopy was accomplished without                            difficulty. The patient tolerated the procedure                            well. Scope  In: 11:21:26 AM Scope Out: 11:29:02 AM Total Procedure Duration: 0 hours 7 minutes 36 seconds  Findings:      Moderately severe esophagitis with no bleeding was found 34 to 39 cm       from the incisors.      A medium-sized hiatal hernia was present.      One 10 mm pedunculated polyp with no bleeding and no stigmata of recent       bleeding was found in the  gastric fundus. Biopsies were taken with a       cold forceps for histology.      The duodenal bulb, first portion of the duodenum and second portion of       the duodenum were normal.      There were esophageal mucosal changes secondary to established       short-segment Barrett's disease present in the distal esophagus. The       maximum longitudinal extent of these mucosal changes was 3 cm in length. Impression:               - Moderately severe esophagitis with no bleeding.                           - Medium-sized hiatal hernia.                           - One gastric polyp. Biopsied.                           - Normal duodenal bulb, first portion of the                            duodenum and second portion of the duodenum.                           - Esophageal mucosal changes secondary to                            established short-segment Barrett's disease. Moderate Sedation:      Per Anesthesia Care Recommendation:           - Return patient to hospital ward for ongoing care.                           - Soft diet.                           - Use a proton pump inhibitor PO BID.                           - No ibuprofen, naproxen, or other non-steroidal                            anti-inflammatory drugs.                           - CBC in one week                           -  Follow up with primary GI at [facility] Procedure Code(s):        --- Professional ---                           [phone], Esophagogastroduodenoscopy, flexible,                            transoral; with biopsy, single or multiple Diagnosis Code(s):        --- Professional ---                           K20.90, Esophagitis, unspecified without bleeding                           K44.9, Diaphragmatic hernia without obstruction or                            gangrene                           K31.7, Polyp of stomach and duodenum                           K22.70, Barrett's esophagus without dysplasia                            D62, Acute posthemorrhagic anemia                           K92.0, Hematemesis CPT copyright 2022 American Medical Association. All rights reserved. The codes documented in this report are preliminary and upon coder review may  be revised to meet current compliance requirements. Elon Alas. Abbey Chatters, DO [place] Abbey Chatters, DO 05/30/2022 11:36:26 AM This report has been signed electronically. Number of Addenda: 0

## 2022-05-30 NOTE — Progress Notes (Signed)
Patient briefly seen this morning. NPO for repeat EGD. No overt GI bleeding. Hgb 10, improved from 9.5 yesterday.   Discussed risks and benefits of EGD again with stated understanding.

## 2022-05-30 NOTE — Anesthesia Preprocedure Evaluation (Signed)
Anesthesia Evaluation  Patient identified by MRN, date of birth, ID band Patient awake    Reviewed: Allergy & Precautions, H&P , NPO status , Patient's Chart, lab work & pertinent test results, reviewed documented beta blocker date and time   Airway Mallampati: II  TM Distance: >3 FB Neck ROM: full    Dental no notable dental hx.    Pulmonary sleep apnea , former smoker   Pulmonary exam normal breath sounds clear to auscultation       Cardiovascular Exercise Tolerance: Good hypertension, + angina  + CAD, + Past MI, + Peripheral Vascular Disease and +CHF   Rhythm:regular Rate:Normal     Neuro/Psych  PSYCHIATRIC DISORDERS  Depression     Neuromuscular disease CVA, Residual Symptoms    GI/Hepatic Neg liver ROS,GERD  Medicated,,  Endo/Other  negative endocrine ROSdiabetes    Renal/GU CRF, ESRF and DialysisRenal disease  negative genitourinary   Musculoskeletal   Abdominal   Peds  Hematology  (+) Blood dyscrasia, anemia   Anesthesia Other Findings   Reproductive/Obstetrics negative OB ROS                              Anesthesia Physical Anesthesia Plan  ASA: 4 and emergent  Anesthesia Plan: General and General ETT   Post-op Pain Management:    Induction:   PONV Risk Score and Plan:   Airway Management Planned:   Additional Equipment:   Intra-op Plan:   Post-operative Plan:   Informed Consent: I have reviewed the patients History and Physical, chart, labs and discussed the procedure including the risks, benefits and alternatives for the proposed anesthesia with the patient or authorized representative who has indicated his/her understanding and acceptance.     Dental Advisory Given  Plan Discussed with: CRNA  Anesthesia Plan Comments:         Anesthesia Quick Evaluation

## 2022-05-31 ENCOUNTER — Ambulatory Visit (HOSPITAL_BASED_OUTPATIENT_CLINIC_OR_DEPARTMENT_OTHER): Payer: Medicare HMO | Admitting: Family

## 2022-05-31 ENCOUNTER — Other Ambulatory Visit: Payer: Self-pay | Admitting: Family Medicine

## 2022-05-31 ENCOUNTER — Ambulatory Visit: Payer: Self-pay | Admitting: *Deleted

## 2022-05-31 DIAGNOSIS — C159 Malignant neoplasm of esophagus, unspecified: Secondary | ICD-10-CM | POA: Diagnosis not present

## 2022-05-31 DIAGNOSIS — D62 Acute posthemorrhagic anemia: Secondary | ICD-10-CM | POA: Diagnosis not present

## 2022-05-31 DIAGNOSIS — N186 End stage renal disease: Secondary | ICD-10-CM | POA: Diagnosis not present

## 2022-05-31 DIAGNOSIS — K922 Gastrointestinal hemorrhage, unspecified: Secondary | ICD-10-CM | POA: Diagnosis not present

## 2022-05-31 LAB — CBC
HCT: 33.4 % — ABNORMAL LOW (ref 39.0–52.0)
Hemoglobin: 10.3 g/dL — ABNORMAL LOW (ref 13.0–17.0)
MCH: 27.1 pg (ref 26.0–34.0)
MCHC: 30.8 g/dL (ref 30.0–36.0)
MCV: 87.9 fL (ref 80.0–100.0)
Platelets: 203 10*3/uL (ref 150–400)
RBC: 3.8 MIL/uL — ABNORMAL LOW (ref 4.22–5.81)
RDW: 17.1 % — ABNORMAL HIGH (ref 11.5–15.5)
WBC: 6.4 10*3/uL (ref 4.0–10.5)
nRBC: 0 % (ref 0.0–0.2)

## 2022-05-31 LAB — RENAL FUNCTION PANEL
Albumin: 3.2 g/dL — ABNORMAL LOW (ref 3.5–5.0)
Anion gap: 7 (ref 5–15)
BUN: 17 mg/dL (ref 8–23)
CO2: 41 mmol/L — ABNORMAL HIGH (ref 22–32)
Calcium: 9.1 mg/dL (ref 8.9–10.3)
Chloride: 87 mmol/L — ABNORMAL LOW (ref 98–111)
Creatinine, Ser: 0.68 mg/dL (ref 0.61–1.24)
GFR, Estimated: >60 mL/min (ref >60)
Glucose, Bld: 122 mg/dL — ABNORMAL HIGH (ref 70–99)
Phosphorus: 3.1 mg/dL (ref 2.5–4.6)
Potassium: 4.3 mmol/L (ref 3.5–5.1)
Sodium: 135 mmol/L (ref 135–145)

## 2022-05-31 LAB — GLUCOSE, CAPILLARY
Glucose-Capillary: 158 mg/dL — ABNORMAL HIGH (ref 70–99)
Glucose-Capillary: 177 mg/dL — ABNORMAL HIGH (ref 70–99)
Glucose-Capillary: 93 mg/dL (ref 70–99)

## 2022-05-31 LAB — SURGICAL PATHOLOGY

## 2022-05-31 MED ORDER — ONDANSETRON HCL 4 MG/2ML IJ SOLN
INTRAMUSCULAR | Status: AC
  Administered 2022-05-31: 4 mg via INTRAVENOUS
  Filled 2022-05-31: qty 2

## 2022-05-31 NOTE — Procedures (Signed)
   HEMODIALYSIS TREATMENT NOTE:   Uneventful 3.75 hour heparin-free treatment completed using RIJ TDC. Goal met: 1.5 liters removed without interruption in UF.  All blood was returned.  No changes from pre-HD assessment.  Hand-off given to Rise Patience, LPN.   Rockwell Alexandria, RN

## 2022-05-31 NOTE — Patient Outreach (Signed)
  Care Coordination   05/31/2022  Name: [name] MRN: [id number] DOB: 1956/06/12   Care Coordination Outreach Attempts:  An unsuccessful telephone outreach was attempted today to offer the patient information about available care coordination services as a benefit of their health plan. HIPAA compliant message left on voicemail, providing contact information for CSW, encouraging patient to return CSW's call at his earliest convenience.  CSW noted that patient is currently hospitalized and undergoing Esophagogastroduodenoscopy with Propofol Biopsy.  Follow Up Plan:  Additional outreach attempts will be made to offer the patient care coordination information and services.   Encounter Outcome:  No Answer.   Care Coordination Interventions:  No, not indicated.    [name], BSW, MSW, LCSW  Licensed Education officer, [facility]  Mailing [place] [address], [place], [place] [address] Physical Address-[address] [place], [place] [address] Toll Free Main # [phone] Fax # [phone] Cell # [phone] [email or web address]

## 2022-05-31 NOTE — Progress Notes (Signed)
Pt transfer from ICU last noc, oriented to room.  Pt denies pain.  ABD soft, non tender.  Last BM 12/17.  Denies nausea.   NP cough.  [place] patent, minimal urine output.  NSR on monitor.

## 2022-05-31 NOTE — Progress Notes (Signed)
SATURATION QUALIFICATIONS: (This note is used to comply with regulatory documentation for home oxygen)   Patient Saturations on Room Air at Rest = 88   Patient Saturations on Room Air while Ambulating = N/A   Patient Saturations on 2 Liters of oxygen while Ambulating = 96   Please briefly explain why patient needs home oxygen: To maintain 02 sat at 90% or above during ambulation.  Orson Eva, DO

## 2022-05-31 NOTE — Plan of Care (Signed)
  Problem: Acute Rehab PT Goals(only PT should resolve) Goal: Pt Will Go Supine/Side To Sit Outcome: Progressing Flowsheets (Taken 05/31/2022 1231) Pt will go Supine/Side to Sit:  Independently  with modified independence Goal: Patient Will Transfer Sit To/From Stand Outcome: Progressing Flowsheets (Taken 05/31/2022 1231) Patient will transfer sit to/from stand:  with modified independence  with supervision Goal: Pt Will Transfer Bed To Chair/Chair To Bed Outcome: Progressing Flowsheets (Taken 05/31/2022 1231) Pt will Transfer Bed to Chair/Chair to Bed:  with modified independence  with supervision Goal: Pt Will Ambulate Outcome: Progressing Flowsheets (Taken 05/31/2022 1231) Pt will Ambulate:  50 feet  with supervision  with rolling walker   12:32 PM, 05/31/22 Lonell Grandchild, MPT Physical Therapist with [facility] [phone] office [phone] mobile phone

## 2022-05-31 NOTE — Progress Notes (Addendum)
[facility] Progress Note    Assessment/ Plan:   1 Hematamesis-  presumably due to his esophageal CA-  GI involved-  s/p EGD 12/15 without active bleeding (clots and old blood in esophagus and stomach). On PPI BID. Hgb has been stable 2 ESRD: normally MWF-  last done Saturday off schedule-  HD currently off schedule this week given high inpatient HD census, will tentatively plan for HD today and then Thursday if still here-  using TDC (has had issues with LUE AVF in the past, "staff kept missing AVF, bruised)-  no heparin  3 Hypertension: BP acceptable, will UF as tolerated 4. Anemia of ESRD: had not been getting ESA as hgb was good as OP-  hgb 10.3 today, monitor for now 5. Metabolic Bone Disease: continue calcitriol-  phoslo resumed (no longer NPO)  Dialyzes at [facility]  MWF 4 hours  EDW 83. HD Bath 2/2.5, Dialyzer 180, Heparin yes. Access [facility]-  but has AVF.  Calcitriol 0.5 TIW  Subjective:   Patient seen and examined bedside. No acute events, no complaints today   Objective:   BP 138/68 (BP Location: Right Arm)   Pulse 68   Temp 98.1 F (36.7 C) (Oral)   Resp 17   Ht '5\' 10"'$  (1.778 m)   Wt 81.2 kg   SpO2 96%   BMI 25.69 kg/m   Intake/Output Summary (Last 24 hours) at 05/31/2022 0810 Last data filed at 05/31/2022 0534 Gross per 24 hour  Intake 285.3 ml  Output 550 ml  Net -264.7 ml   Weight change:   Physical Exam: Gen: NAD, laying flat in bed CVS: RRR Resp: CTA BL Abd: soft, nt/nd Ext: no sig edema Neuro: awake, alert Dialysis access: LUE AVF +b/t and RIJ TDC c/d/i  Imaging: No results found.  Labs: BMET Recent Labs  Lab 05/27/22 1138 05/27/22 1200 05/27/22 1254 05/28/22 0354  NA 135 133* 133* 138  K 4.5 4.8 5.5* 4.2  CL 96* 99 98 105  CO2 24  --   --  22  GLUCOSE 231* 233* 230* 131*  BUN 66* 69* 85* 74*  CREATININE 4.84* 5.50* 5.50* 5.36*  CALCIUM 8.6*  --   --  8.3*   CBC Recent Labs  Lab 05/27/22 1138 05/27/22 1200 05/28/22 0354  05/28/22 1353 05/29/22 1301 05/29/22 2104 05/30/22 0336 05/31/22 0512  WBC 8.6  --  7.1  --   --   --   --  6.4  NEUTROABS 6.9  --   --   --   --   --   --   --   HGB 12.1*   < > 10.4*   < > 9.4* 9.5* 10.0* 10.3*  HCT 39.9   < > 33.8*   < > 30.8* 30.5* 32.2* 33.4*  MCV 88.3  --  88.3  --   --   --   --  87.9  PLT 244  --  246  --   --   --   --  203   < > = values in this interval not displayed.    Medications:     amiodarone  200 mg Oral Daily   ARIPiprazole  5 mg Oral Daily   calcitRIOL  0.5 mcg Oral Q T,Th,Sa-HD   calcium acetate  667 mg Oral TID WC   Chlorhexidine Gluconate Cloth  6 each Topical Q0600   Chlorhexidine Gluconate Cloth  6 each Topical Q0600   insulin aspart  0-6 Units Subcutaneous  TID WC   isosorbide dinitrate  30 mg Oral TID   metoprolol succinate  50 mg Oral Daily   ondansetron (ZOFRAN) IV  4 mg Intravenous Q8H   pantoprazole  40 mg Intravenous Q12H   sertraline  100 mg Oral Daily      Gean Quint, MD [facility] 05/31/2022, 8:10 AM

## 2022-05-31 NOTE — Anesthesia Postprocedure Evaluation (Signed)
Anesthesia Post Note  Patient: [name]  Procedure(s) Performed: ESOPHAGOGASTRODUODENOSCOPY (EGD) WITH PROPOFOL BIOPSY  Patient location during evaluation: Phase II Anesthesia Type: General Level of consciousness: awake Pain management: pain level controlled Vital Signs Assessment: post-procedure vital signs reviewed and stable Respiratory status: spontaneous breathing and respiratory function stable Cardiovascular status: blood pressure returned to baseline and stable Postop Assessment: no headache and no apparent nausea or vomiting Anesthetic complications: no Comments: Late entry   No notable events documented.   Last Vitals:  Vitals:   05/31/22 0208 05/31/22 0542  BP: 135/70 138/68  Pulse: 69 68  Resp: 18 17  Temp: 36.8 C 36.7 C  SpO2: 96% 96%    Last Pain:  Vitals:   05/31/22 0542  TempSrc: Oral  PainSc:                  [name]

## 2022-05-31 NOTE — TOC Transition Note (Signed)
Transition of Care [facility]) - CM/SW Discharge Note   Patient Details  Name: [name] MRN: [id number] Date of Birth: February 25, 1956  Transition of Care [facility]) CM/SW Contact:  Shade Flood, LCSW Phone Number: 05/31/2022, 2:17 PM   Clinical Narrative:     Pt stable for dc home today with O2 per MD. Damaris Schooner with pt to discuss CMS provider options. Referred to [facility]. POC and etank will be delivered to pt's room after he gets out of HD. Updated RN. Once O2 supplies here, pt can dc.  Updated [facility] [place]. No other TOC needs.  Final next level of care: [place] Barriers to Discharge: Barriers Resolved   Patient Goals and CMS Choice Patient states their goals for this hospitalization and ongoing recovery are:: Home with [facility] CMS Medicare.gov Compare Post Acute Care list provided to:: Patient Choice offered to / list presented to : Patient  Discharge Placement                       Discharge Plan and Services                DME Arranged: Oxygen DME Agency: [facility] Date DME Agency Contacted: 05/31/22   Representative spoke with at DME Agency: Erasmo Downer HH Arranged: RN, PT [facility] Agency: [place] Date [place]: 05/28/22 Time [place]: 0762 Representative spoke with at [place]: [place] ([place]) Interventions     Readmission Risk Interventions    05/04/2022    9:06 AM 03/01/2022    8:14 AM 08/30/2021    2:40 PM  Readmission Risk Prevention Plan  Transportation Screening Complete Complete Complete  Medication Review Press photographer) Complete Complete Complete  PCP or Specialist appointment within 3-5 days of discharge   Not Complete  HRI or [place] Complete Complete Complete  SW Recovery Care/Counseling Consult Complete Complete Complete  Palliative Care Screening Not Applicable Not Applicable Not [place] Not Applicable Not Applicable Not  Applicable

## 2022-05-31 NOTE — Evaluation (Signed)
Physical Therapy Evaluation Patient Details Name: [name] MRN: [id number] DOB: 08-18-1955 Today's Date: 05/31/2022  History of Present Illness  [name] is a 66 year old male with a history of coronary disease status post CABG, systolic and diastolic CHF, diabetes mellitus type 2, hypertension, ESRD, SVT, esophageal adenocarcinoma, and OSA presenting with hematemesis during his dialysis session earlier today on 05/27/2022.  The patient actually states that he had an episode of hematemesis on the evening of 05/26/2022.  The patient was recently admitted to the hospital from 05/03/2022 to 05/05/2022 secondary to Enterobacter cloacae bacteremia.  He was treated with cefepime on dialysis.  He was also fluid overloaded during that admission.  He was discharged in stable condition to complete a course of cefepime. His dialysis catheter was removed on 05/24/2022 at [place] [facility], and a new dialysis catheter was placed on the same day.  He was at [facility] to have his usual surveillance EGD on 05/26/2022, but ended up having SVT and hypotension.  He was transferred to the ED.  He stabilized and was discharged home.     Notably, the patient was recently admitted to the hospital  The patient had been in his usual state of health until yesterday, 05/26/2022.  Apparently he was going for an EGD, but he was noted to have SVT with heart rate in the 160s.  It was canceled.  The patient denies any NSAIDs.  He only completed 30 minutes of his dialysis on 05/27/2022.  Patient denies fevers, chills, headache, chest pain, dyspnea, nausea, vomiting, diarrhea, abdominal pain, dysuria, hematuria, hematochezia, and melena.   Clinical Impression  Patient functioning near baseline for functional mobility and gait demonstrating good return for bed mobility, transferring to chair at bedside and to/from commode in bathroom, but limited to ambulating in room mostly due to c/o fatigue.  Patient tolerated  staying up in chair after therapy.  Patient will benefit from continued skilled physical therapy in hospital and recommended venue below to increase strength, balance, endurance for safe ADLs and gait.          Recommendations for follow up therapy are one component of a multi-disciplinary discharge planning process, led by the attending physician.  Recommendations may be updated based on patient status, additional functional criteria and insurance authorization.  Follow Up Recommendations Home health PT      Assistance Recommended at Discharge Set up Supervision/Assistance  Patient can return home with the following  A little help with walking and/or transfers;A little help with bathing/dressing/bathroom;Help with stairs or ramp for entrance;Assistance with cooking/housework    Equipment Recommendations None recommended by PT  Recommendations for Other Services       Functional Status Assessment Patient has had a recent decline in their functional status and demonstrates the ability to make significant improvements in function in a reasonable and predictable amount of time.     Precautions / Restrictions Precautions Precautions: Fall Restrictions Weight Bearing Restrictions: No      Mobility  Bed Mobility Overal bed mobility: Modified Independent             General bed mobility comments: increased time    Transfers Overall transfer level: Needs assistance Equipment used: Rolling walker (2 wheels) Transfers: Sit to/from Stand, Bed to chair/wheelchair/BSC Sit to Stand: Supervision   Step pivot transfers: Supervision       General transfer comment: increased time, labored movement    Ambulation/Gait Ambulation/Gait assistance: Supervision, Min guard Gait Distance (Feet): 15 Feet Assistive device:  Rolling walker (2 wheels) Gait Pattern/deviations: Decreased step length - right, Decreased step length - left, Decreased stride length Gait velocity: decreased      General Gait Details: slow labored cadence without loss of balance, limited mostly due to fatigue  Stairs            Wheelchair Mobility    Modified Rankin (Stroke Patients Only)       Balance Overall balance assessment: Needs assistance Sitting-balance support: Feet supported, No upper extremity supported Sitting balance-Leahy Scale: Good Sitting balance - Comments: seated at EOB   Standing balance support: During functional activity, Bilateral upper extremity supported, Reliant on assistive device for balance Standing balance-Leahy Scale: Fair Standing balance comment: using RW                             Pertinent Vitals/Pain Pain Assessment Pain Assessment: No/denies pain    Home Living Family/patient expects to be discharged to:: Private residence Living Arrangements: Alone Available Help at Discharge: Personal care attendant Type of Home: House Home Access: Stairs to enter Entrance Stairs-Rails: Right Entrance Stairs-Number of Steps: 6 Alternate Level Stairs-Number of Steps: 6 + 6 Home Layout: Multi-level Home Equipment: Grab bars - tub/shower;Rolling Walker (2 wheels);Cane - quad      Prior Function Prior Level of Function : Needs assist       Physical Assist : Mobility (physical);ADLs (physical) Mobility (physical): Bed mobility;Transfers;Gait;Stairs   Mobility Comments: Household ambulator using RW, uses wheelchair for longer distances ADLs Comments: [name] helps with IADL tasks. Reports independence with ADL tasks     Hand Dominance   Dominant Hand: Right    Extremity/Trunk Assessment   Upper Extremity Assessment Upper Extremity Assessment: Overall WFL for tasks assessed    Lower Extremity Assessment Lower Extremity Assessment: Generalized weakness    Cervical / Trunk Assessment Cervical / Trunk Assessment: Kyphotic  Communication   Communication: No difficulties  Cognition Arousal/Alertness: Awake/alert Behavior  During Therapy: WFL for tasks assessed/performed Overall Cognitive Status: Within Functional Limits for tasks assessed                                          General Comments      Exercises     Assessment/Plan    PT Assessment Patient needs continued PT services  PT Problem List Decreased strength;Decreased activity tolerance;Decreased balance;Decreased mobility       PT Treatment Interventions DME instruction;Gait training;Stair training;Functional mobility training;Therapeutic activities;Therapeutic exercise;Patient/family education;Balance training    PT Goals (Current goals can be found in the Care Plan section)  Acute Rehab PT Goals Patient Stated Goal: return home PT Goal Formulation: With patient Time For Goal Achievement: 06/03/22 Potential to Achieve Goals: Good    Frequency Min 3X/week     Co-evaluation               AM-PAC PT "6 Clicks" Mobility  Outcome Measure Help needed turning from your back to your side while in a flat bed without using bedrails?: None Help needed moving from lying on your back to sitting on the side of a flat bed without using bedrails?: None Help needed moving to and from a bed to a chair (including a wheelchair)?: A Little Help needed standing up from a chair using your arms (e.g., wheelchair or bedside chair)?: A Little Help needed to walk in hospital  room?: A Little Help needed climbing 3-5 steps with a railing? : A Little 6 Click Score: 20    End of Session   Activity Tolerance: Patient tolerated treatment well;Patient limited by fatigue Patient left: in chair;with call bell/phone within reach Nurse Communication: Mobility status PT Visit Diagnosis: Unsteadiness on feet (R26.81);Other abnormalities of gait and mobility (R26.89);Muscle weakness (generalized) (M62.81)    Time: 9373-4287 PT Time Calculation (min) (ACUTE ONLY): 31 min   Charges:   PT Evaluation $PT Eval Moderate Complexity: 1 Mod PT  Treatments $Therapeutic Activity: 23-37 mins        12:29 PM, 05/31/22 Lonell Grandchild, MPT Physical Therapist with [facility] [phone] office [phone] mobile phone

## 2022-06-01 ENCOUNTER — Encounter (HOSPITAL_COMMUNITY): Payer: Self-pay | Admitting: Gastroenterology

## 2022-06-01 ENCOUNTER — Other Ambulatory Visit: Payer: Self-pay

## 2022-06-01 ENCOUNTER — Telehealth: Payer: Self-pay | Admitting: *Deleted

## 2022-06-01 LAB — HEMOGLOBIN AND HEMATOCRIT, BLOOD
HCT: 32.4 % — ABNORMAL LOW (ref 39.0–52.0)
Hemoglobin: 10.1 g/dL — ABNORMAL LOW (ref 13.0–17.0)

## 2022-06-01 NOTE — Progress Notes (Signed)
  Care Coordination Note  06/01/2022 Name: CREEK GAN MRN: [id number] DOB: 1955-12-05  LARICO DIMOCK is a 66 y.o. year old male who is a primary care patient of [name], [name], MD and is actively engaged with the care management team. I reached out to [name] by phone today to assist with re-scheduling a follow up visit with the Licensed Clinical Social Worker  Follow up plan: Unsuccessful telephone outreach attempt made. A HIPAA compliant phone message was left for the patient providing contact information and requesting a return call.   [place]  Direct Dial: [phone]

## 2022-06-01 NOTE — Patient Outreach (Signed)
06/01/22  [place] 08/20/1955 [id number]  [facility] Liaison remote review of patient transitioning from [facility].   Primary Care Provider: [name], MD with [facility]    Patient was noted as active with [facility] Licensed Clinical Social Worker for assistance with personal care services per LCSW notes.  Updated [facility] LCSW of patient's transition home with home health and new oxygen needs for home.  For questions,   Natividad Brood, RN BSN [place]  [phone] business mobile phone Toll free office [phone]  *[place]  [phone] Fax number: [phone] [email or web address]

## 2022-06-02 ENCOUNTER — Telehealth: Payer: Self-pay | Admitting: *Deleted

## 2022-06-02 NOTE — Patient Outreach (Signed)
  Care Coordination TOC Note Transition Care Management Unsuccessful Follow-up Telephone Call  Date of discharge and from where:  [facility] on 05/31/22  Attempts:  1st Attempt  Reason for unsuccessful TCM follow-up call:  Left voice message  Chong Sicilian, BSN, RN-BC RN Care Coordinator [place]: [phone] Main #: [phone]

## 2022-06-03 ENCOUNTER — Telehealth: Payer: Self-pay | Admitting: *Deleted

## 2022-06-03 NOTE — Patient Outreach (Signed)
  Care Coordination TOC Note Transition Care Management Follow-up Telephone Call Date of discharge and from where: [facility] on 05/31/22 How have you been since you were released from the hospital? "Feeling weak and tired. I felt good until I got sick and started throwing up blood." Any questions or concerns? Yes. Having difficulty securing transportation to dialysis.   Items Reviewed: Did the pt receive and understand the discharge instructions provided? Yes  Medications obtained and verified? Yes  Other? Yes . Discussed transportation options Any new allergies since your discharge? No  Dietary orders reviewed? Yes Do you have support at home?  Inconsistent   Home Care and Equipment/Supplies: Were home health services ordered? yes If so, what is the name of the agency? [facility]  Has the agency set up a time to come to the patient's home? No. Patient was receiving services through them already and has their number. He will call today to follow-up. Were any new equipment or medical supplies ordered?  No What is the name of the medical supply agency?  Were you able to get the supplies/equipment? not applicable Do you have any questions related to the use of the equipment or supplies? No  Functional Questionnaire: (I = Independent and D = Dependent) ADLs: I  Bathing/Dressing- I  Meal Prep- I  Eating- I  Maintaining continence- I  Transferring/Ambulation- I  Managing Meds- I  Follow up appointments reviewed:  PCP Hospital f/u appt confirmed? No   Specialist Hospital f/u appt confirmed?  Not Indicated  . Are transportation arrangements needed? Yes  If their condition worsens, is the pt aware to call PCP or go to the Emergency Dept.? Yes Was the patient provided with contact information for the PCP's office or ED? Yes Was to pt encouraged to call back with questions or concerns? Yes  [name] assessments and interventions completed:   Yes [name] Interventions Today    Flowsheet  Row Most Recent Value  [name] Interventions   Housing Interventions Intervention Not Indicated  Transportation Interventions AMB Referral        Care Coordination Interventions:  PCP follow up appointment requested Referred for Care Coordination Services:  Clinical Social Work. Scheduled f/u with [name], LCSW for 06/14/22 Provided with [facility] 24 hour nurse/concierge line number    Encounter Outcome:  Pt. Visit Completed

## 2022-06-08 ENCOUNTER — Ambulatory Visit: Payer: Self-pay | Admitting: *Deleted

## 2022-06-08 ENCOUNTER — Encounter: Payer: Self-pay | Admitting: *Deleted

## 2022-06-08 DIAGNOSIS — Z992 Dependence on renal dialysis: Secondary | ICD-10-CM | POA: Diagnosis not present

## 2022-06-08 DIAGNOSIS — R6889 Other general symptoms and signs: Secondary | ICD-10-CM | POA: Diagnosis not present

## 2022-06-08 DIAGNOSIS — N186 End stage renal disease: Secondary | ICD-10-CM | POA: Diagnosis not present

## 2022-06-08 DIAGNOSIS — K920 Hematemesis: Secondary | ICD-10-CM | POA: Diagnosis not present

## 2022-06-08 DIAGNOSIS — N2581 Secondary hyperparathyroidism of renal origin: Secondary | ICD-10-CM | POA: Diagnosis not present

## 2022-06-08 NOTE — Patient Instructions (Signed)
Visit Information  Thank you for taking time to visit with me today. Please don't hesitate to contact me if I can be of assistance to you.   Following are the goals we discussed today:   Goals Addressed               This Visit's Progress     Receive Assistance Applying for [place]. (pt-stated)   On track     Care Coordination Interventions:  Active listening/reflection utilized.  Verbalization of feelings encouraged. Emotional support provided. Problem solving interventions employed. Task-centered strategies activated. Solution-focused activities implemented. Caregiver stress acknowledged. Caregiver resources reviewed.   Continue to receive home health nursing & physical therapy services, through [facility] [phone]). Continue to receive home oxygen, through [facility] (# [phone]).   Please complete application for Personal Care Services & submit to Primary Care Provider, Dr. [name], for review & signature. CSW will obtain completed & signed [place] application from Primary Care Provider, Dr. [name] & submit to [facility] [phone]) for processing.   Please keep [place] Provider List, until approved for [place], through [facility] [phone]). Continue to attend dialysis treatments on Monday's, Wednesday's & Friday's. Please consider using one or more of the following transportation services, all free of charge to [place] Adult Medicaid recipients:   ~ [place] [phone]) ~ "[employer], music [phone]) ~ Medicaid Transportation (# [phone]) ~ [employer] Transportation (# [phone]) ~ [place] (# [phone]) CSW collaboration with Primary Care Provider, Dr. [name], to request order for Power Lift Chair be faxed  to [facility] [phone]).       Our next appointment is by telephone on 06/14/2022 at 2:15 pm.  Please call the care guide team at [phone] if you need to cancel or reschedule your appointment.   If you are experiencing a Mental Health or [place] or need someone to talk to, please call the Suicide and Crisis Lifeline: 988 call the [place] National Suicide Prevention Lifeline: 985 068 9783 or TTY: 912-619-0371 TTY 409-086-0502) to talk to a trained counselor call 1-800-273-TALK (toll free, 24 hour hotline) go to [facility] [address], [place] [phone]) call the [place]: [phone] call 911  Patient verbalizes understanding of instructions and care plan provided today and agrees to view in [place]. Active MyChart status and patient understanding of how to access instructions and care plan via MyChart confirmed with patient.     Telephone follow up appointment with care management team member scheduled for:  06/14/2022 at 2:15 pm.    [name], BSW, MSW, [place]  Licensed Clinical Social Worker  [place]  Mailing [place]. [address], [place], [place] [address] Physical Address-[address] [place], [place] [address] Toll Free Main # [phone] Fax # [phone] Cell # [phone] [email or web address]

## 2022-06-08 NOTE — Patient Outreach (Signed)
  Care Coordination   Follow Up Visit Note   06/08/2022  Name: [name] MRN: [id number] DOB: [birth year]  BRADFORD CAZIER is a 66 y.o. year old male who sees [name], [name], MD for primary care. I spoke with [name] by phone today.  What matters to the patients health and wellness today?  Receive Assistance Applying for [place].    Goals Addressed               This Visit's Progress     Receive Assistance Applying for [place]. (pt-stated)   On track     Care Coordination Interventions:  Active listening/reflection utilized.  Verbalization of feelings encouraged. Emotional support provided. Problem solving interventions employed. Task-centered strategies activated. Solution-focused activities implemented. Caregiver stress acknowledged. Caregiver resources reviewed.   Continue to receive home health nursing & physical therapy services, through [facility] [phone]). Continue to receive home oxygen, through [facility] (# [phone]).   Please complete application for Personal Care Services & submit to Primary Care Provider, Dr. [name], for review & signature. CSW will obtain completed & signed [place] application from Primary Care Provider, Dr. [name] & submit to [facility] [phone]) for processing.   Please keep [place] Provider List, until approved for [place], through [facility] [phone]). Continue to attend dialysis treatments on Monday's, Wednesday's & Friday's. Please consider using one or more of the following transportation services, all free of charge to [place] Adult Medicaid recipients:   ~ [place] [phone]) ~ "[employer], music [phone]) ~ Medicaid Transportation (# [phone]) ~ [employer] Transportation (#  [phone]) ~ [place] (# [phone]) CSW collaboration with Primary Care Provider, Dr. [name], to request order for Power Lift Chair be faxed to [facility] [phone]).       [name] assessments and interventions completed:  Yes.  Care Coordination Interventions:  Yes, provided.   Follow up plan: Follow up call scheduled for 06/14/2022 at 2:15 pm.  Encounter Outcome:  Pt. Visit Completed.   [name], BSW, MSW, LCSW  Licensed Education officer, [facility]  Mailing [place] [address] [place], [place] [address] Physical Address-[address] [place], [place] [address] Toll Free Main # [phone] Fax # [phone] Cell # [phone] [email or web address]

## 2022-06-09 NOTE — Progress Notes (Signed)
Patient spoke with Di Kindle 06/09/23. Closing outreach   [place]  Direct Dial: [phone]

## 2022-06-10 DIAGNOSIS — N186 End stage renal disease: Secondary | ICD-10-CM | POA: Diagnosis not present

## 2022-06-10 DIAGNOSIS — Z992 Dependence on renal dialysis: Secondary | ICD-10-CM | POA: Diagnosis not present

## 2022-06-10 DIAGNOSIS — N2581 Secondary hyperparathyroidism of renal origin: Secondary | ICD-10-CM | POA: Diagnosis not present

## 2022-06-10 DIAGNOSIS — R6889 Other general symptoms and signs: Secondary | ICD-10-CM | POA: Diagnosis not present

## 2022-06-12 DIAGNOSIS — Z992 Dependence on renal dialysis: Secondary | ICD-10-CM | POA: Diagnosis not present

## 2022-06-12 DIAGNOSIS — N186 End stage renal disease: Secondary | ICD-10-CM | POA: Diagnosis not present

## 2022-06-12 DIAGNOSIS — E1122 Type 2 diabetes mellitus with diabetic chronic kidney disease: Secondary | ICD-10-CM | POA: Diagnosis not present

## 2022-06-13 ENCOUNTER — Other Ambulatory Visit: Payer: Self-pay | Admitting: Family Medicine

## 2022-06-14 ENCOUNTER — Encounter: Payer: Self-pay | Admitting: *Deleted

## 2022-06-14 ENCOUNTER — Ambulatory Visit: Payer: Self-pay | Admitting: *Deleted

## 2022-06-14 NOTE — Patient Instructions (Signed)
Visit Information  Thank you for taking time to visit with me today. Please don't hesitate to contact me if I can be of assistance to you.   Following are the goals we discussed today:   Goals Addressed               This Visit's Progress     Receive Assistance Applying for [place]. (pt-stated)   On track     Care Coordination Interventions:  Active listening/reflection utilized.  Verbalization of feelings encouraged. Feelings of frustration & loss validated, while discussing termination of private pay caregiver services. Emotional support provided. Problem solving interventions employed. Task-centered strategies activated. Solution-focused activities reviewed. Caregiver stress acknowledged. Caregiver resources reviewed.  Support groups discussed, from list provided & self-enrollment emphasized.  Continue to receive home health nursing & physical therapy services, through [facility] [phone]). Continue to receive home oxygen (portable tank, concentrator & supplies), through [employer] [phone]).   CSW collaboration with Primary Care Provider, Dr. [name] [phone]), to request order for Power Lift Chair be faxed to [facility] [phone]). ~ HIPAA compliant message left with receptionist at [place] [phone]), to request order for Power Lift Chair be faxed to [facility] [phone]). CSW collaboration with Primary Care Provider, Dr. [name], to request completion & signature of [place] application & submission to [facility] [phone]), for processing.   ~ HIPAA compliant message left with receptionist at [place] [phone]), to request completion & signature of [place] application & submission to [facility] [phone]), for processing.   Please keep [place] Provider List,  until approved for [place], through [facility] [phone]). Continue to attend dialysis treatments on Monday's, Wednesday's & Friday's, receiving transportation assistance through [place] [phone]). Please continue to consider using one or more of the following transportation services, all free of charge to [place] Adult Medicaid recipients:   ~ [place] [phone]) ~ "[employer], music [phone]) ~ Medicaid Transportation (# [phone]) ~ [employer] (# [phone]) ~ [place] (# [phone])        Our next appointment is by telephone on 06/27/2022 at 9:00 am.  Please call the care guide team at [phone] if you need to cancel or reschedule your appointment.   If you are experiencing a Mental Health or [place] or need someone to talk to, please call the Suicide and Crisis Lifeline: 988 call the [place] National Suicide Prevention Lifeline: (757) 001-7105 or TTY: 8063460866 TTY (928)322-3371) to talk to a trained counselor call 1-800-273-TALK (toll free, 24 hour hotline) go to [facility] [address], [place] [phone]) call the [place]: [phone] call 911  Patient verbalizes understanding of instructions and care plan provided today and agrees to view in [place]. Active MyChart status and patient understanding of how to access instructions and care plan via MyChart confirmed with patient.     Telephone follow up appointment with care management team member scheduled for:  06/27/2022 at 9:00 am.  [name], BSW, MSW, [place]  Licensed Clinical Social Worker  [place]  Mailing [place]. [address] [place], [place]  [address] Physical Address-[address] [place], [place] [address] Toll Free Main # [phone] Fax # [phone] Cell # [phone] [email or web address]

## 2022-06-14 NOTE — Patient Outreach (Signed)
  Care Coordination   Follow Up Visit Note   06/14/2022  Name: EWALD BEG MRN: [id number] DOB: 06-10-1956  IFEOLUWA BELLER is a 67 y.o. year old male who sees [name], [name], MD for primary care. I spoke with [name] by phone today.  What matters to the patients health and wellness today?  Receive Assistance Applying for [place].    Goals Addressed               This Visit's Progress     Receive Assistance Applying for [place]. (pt-stated)   On track     Care Coordination Interventions:  Active listening/reflection utilized.  Verbalization of feelings encouraged. Feelings of frustration & loss validated, while discussing termination of private pay caregiver services. Emotional support provided. Problem solving interventions employed. Task-centered strategies activated. Solution-focused activities reviewed. Caregiver stress acknowledged. Caregiver resources reviewed.  Support groups discussed, from list provided & self-enrollment emphasized.  Continue to receive home health nursing & physical therapy services, through [facility] [phone]). Continue to receive home oxygen (portable tank, concentrator & supplies), through [employer] [phone]).   CSW collaboration with Primary Care Provider, Dr. [name] [phone]), to request order for Power Lift Chair be faxed to [facility] [phone]). ~ HIPAA compliant message left with receptionist at [place] [phone]), to request order for Power Lift Chair be faxed to [facility] [phone]). CSW collaboration with Primary Care Provider, Dr. [name], to request completion & signature of [place] application & submission to [facility] [phone]), for processing.   ~ HIPAA compliant message left with receptionist at [place] [phone]), to request  completion & signature of [place] application & submission to [facility] [phone]), for processing.   Please keep [place] Provider List, until approved for [place], through [facility] [phone]). Continue to attend dialysis treatments on Monday's, Wednesday's & Friday's, receiving transportation assistance through [place] [phone]). Please continue to consider using one or more of the following transportation services, all free of charge to [place] Adult Medicaid recipients:   ~ [place] [phone]) ~ "[employer], music [phone]) ~ Medicaid Transportation (# [phone]) ~ [place] (# [phone]) ~ [place] (# [phone])        [name] assessments and interventions completed:  Yes.  Care Coordination Interventions:  Yes, provided.   Follow up plan: Follow up call scheduled for 06/27/2022 at 9:00 am.   Encounter Outcome:  Pt. Visit Completed.   [name], BSW, MSW, LCSW  Licensed Education officer, [facility]  Mailing [place] [place], [place] [address] Physical Address-[address], [place], [place] [address] Toll Free Main # [phone] Fax # [phone] Cell # [phone] [email or web address]

## 2022-06-15 DIAGNOSIS — Z992 Dependence on renal dialysis: Secondary | ICD-10-CM | POA: Diagnosis not present

## 2022-06-15 DIAGNOSIS — N186 End stage renal disease: Secondary | ICD-10-CM | POA: Diagnosis not present

## 2022-06-15 DIAGNOSIS — R6889 Other general symptoms and signs: Secondary | ICD-10-CM | POA: Diagnosis not present

## 2022-06-15 DIAGNOSIS — N2581 Secondary hyperparathyroidism of renal origin: Secondary | ICD-10-CM | POA: Diagnosis not present

## 2022-06-16 NOTE — Progress Notes (Signed)
06/16/22-Script faxed to [facility]

## 2022-06-20 DIAGNOSIS — Z992 Dependence on renal dialysis: Secondary | ICD-10-CM | POA: Diagnosis not present

## 2022-06-20 DIAGNOSIS — N2581 Secondary hyperparathyroidism of renal origin: Secondary | ICD-10-CM | POA: Diagnosis not present

## 2022-06-20 DIAGNOSIS — N186 End stage renal disease: Secondary | ICD-10-CM | POA: Diagnosis not present

## 2022-06-21 ENCOUNTER — Other Ambulatory Visit: Payer: Self-pay

## 2022-06-21 ENCOUNTER — Telehealth: Payer: Self-pay

## 2022-06-21 DIAGNOSIS — N186 End stage renal disease: Secondary | ICD-10-CM

## 2022-06-21 NOTE — Telephone Encounter (Signed)
Patient has been informed per drs notes and recommendations

## 2022-06-21 NOTE — Telephone Encounter (Signed)
Patient calls and would like referral to palliative care and home health aide assistance. He also requests a prescription for Inogen portable oxygen . Please advise

## 2022-06-21 NOTE — Telephone Encounter (Signed)
Nurses #1 please go ahead with referral for palliative care 2.  May also benefit from social service-[facility] consult as well It should be noted that the last time we tried to get palliative care we could not find any home health organization willing to do palliative care with his insurance-partly this was because lack of availability of places doing this as well as lack of places excepting his insurance for this-please make sure that the patient is aware of this issue Also recently [facility] hospice that does palliative care stated that they will only do palliative care for cancer related patient's So all of these issues make this a very complex problem To go ahead with referral Perhaps social worker may have some additional ideas that could be helpful with getting him assistance  Diagnosis-end-stage renal disease, congestive heart failure, weakness

## 2022-06-22 DIAGNOSIS — R6889 Other general symptoms and signs: Secondary | ICD-10-CM | POA: Diagnosis not present

## 2022-06-22 DIAGNOSIS — Z992 Dependence on renal dialysis: Secondary | ICD-10-CM | POA: Diagnosis not present

## 2022-06-22 DIAGNOSIS — N2581 Secondary hyperparathyroidism of renal origin: Secondary | ICD-10-CM | POA: Diagnosis not present

## 2022-06-22 DIAGNOSIS — N186 End stage renal disease: Secondary | ICD-10-CM | POA: Diagnosis not present

## 2022-06-23 ENCOUNTER — Emergency Department (HOSPITAL_COMMUNITY): Payer: Medicare HMO

## 2022-06-23 ENCOUNTER — Observation Stay (HOSPITAL_COMMUNITY)
Admission: EM | Admit: 2022-06-23 | Discharge: 2022-06-23 | Disposition: A | Discharge status: Home / Self Care | Payer: Medicare HMO | Attending: Family Medicine | Admitting: Family Medicine

## 2022-06-23 ENCOUNTER — Encounter (HOSPITAL_COMMUNITY): Payer: Self-pay

## 2022-06-23 ENCOUNTER — Other Ambulatory Visit: Payer: Self-pay

## 2022-06-23 DIAGNOSIS — Z87891 Personal history of nicotine dependence: Secondary | ICD-10-CM | POA: Diagnosis not present

## 2022-06-23 DIAGNOSIS — E1140 Type 2 diabetes mellitus with diabetic neuropathy, unspecified: Secondary | ICD-10-CM | POA: Insufficient documentation

## 2022-06-23 DIAGNOSIS — K219 Gastro-esophageal reflux disease without esophagitis: Secondary | ICD-10-CM

## 2022-06-23 DIAGNOSIS — I471 Supraventricular tachycardia, unspecified: Secondary | ICD-10-CM

## 2022-06-23 DIAGNOSIS — E1165 Type 2 diabetes mellitus with hyperglycemia: Secondary | ICD-10-CM | POA: Diagnosis not present

## 2022-06-23 DIAGNOSIS — N184 Chronic kidney disease, stage 4 (severe): Secondary | ICD-10-CM

## 2022-06-23 DIAGNOSIS — Z992 Dependence on renal dialysis: Secondary | ICD-10-CM

## 2022-06-23 DIAGNOSIS — I429 Cardiomyopathy, unspecified: Secondary | ICD-10-CM

## 2022-06-23 DIAGNOSIS — I2510 Atherosclerotic heart disease of native coronary artery without angina pectoris: Secondary | ICD-10-CM | POA: Insufficient documentation

## 2022-06-23 DIAGNOSIS — I10 Essential (primary) hypertension: Secondary | ICD-10-CM

## 2022-06-23 DIAGNOSIS — E8809 Other disorders of plasma-protein metabolism, not elsewhere classified: Secondary | ICD-10-CM | POA: Diagnosis not present

## 2022-06-23 DIAGNOSIS — I132 Hypertensive heart and chronic kidney disease with heart failure and with stage 5 chronic kidney disease, or end stage renal disease: Secondary | ICD-10-CM | POA: Diagnosis not present

## 2022-06-23 DIAGNOSIS — Z7984 Long term (current) use of oral hypoglycemic drugs: Secondary | ICD-10-CM | POA: Diagnosis not present

## 2022-06-23 DIAGNOSIS — Z8501 Personal history of malignant neoplasm of esophagus: Secondary | ICD-10-CM | POA: Insufficient documentation

## 2022-06-23 DIAGNOSIS — E1122 Type 2 diabetes mellitus with diabetic chronic kidney disease: Secondary | ICD-10-CM | POA: Insufficient documentation

## 2022-06-23 DIAGNOSIS — Z79899 Other long term (current) drug therapy: Secondary | ICD-10-CM | POA: Insufficient documentation

## 2022-06-23 DIAGNOSIS — I25708 Atherosclerosis of coronary artery bypass graft(s), unspecified, with other forms of angina pectoris: Secondary | ICD-10-CM | POA: Diagnosis present

## 2022-06-23 DIAGNOSIS — I491 Atrial premature depolarization: Secondary | ICD-10-CM | POA: Diagnosis not present

## 2022-06-23 DIAGNOSIS — I25810 Atherosclerosis of coronary artery bypass graft(s) without angina pectoris: Secondary | ICD-10-CM | POA: Diagnosis not present

## 2022-06-23 DIAGNOSIS — R7989 Other specified abnormal findings of blood chemistry: Secondary | ICD-10-CM | POA: Insufficient documentation

## 2022-06-23 DIAGNOSIS — N186 End stage renal disease: Secondary | ICD-10-CM | POA: Diagnosis not present

## 2022-06-23 DIAGNOSIS — R42 Dizziness and giddiness: Secondary | ICD-10-CM | POA: Diagnosis not present

## 2022-06-23 DIAGNOSIS — Z7982 Long term (current) use of aspirin: Secondary | ICD-10-CM | POA: Insufficient documentation

## 2022-06-23 DIAGNOSIS — R0789 Other chest pain: Secondary | ICD-10-CM | POA: Diagnosis not present

## 2022-06-23 DIAGNOSIS — Z951 Presence of aortocoronary bypass graft: Secondary | ICD-10-CM

## 2022-06-23 DIAGNOSIS — R002 Palpitations: Secondary | ICD-10-CM

## 2022-06-23 DIAGNOSIS — J90 Pleural effusion, not elsewhere classified: Secondary | ICD-10-CM | POA: Diagnosis not present

## 2022-06-23 DIAGNOSIS — I120 Hypertensive chronic kidney disease with stage 5 chronic kidney disease or end stage renal disease: Secondary | ICD-10-CM | POA: Diagnosis not present

## 2022-06-23 DIAGNOSIS — I5042 Chronic combined systolic (congestive) and diastolic (congestive) heart failure: Secondary | ICD-10-CM

## 2022-06-23 DIAGNOSIS — R079 Chest pain, unspecified: Secondary | ICD-10-CM

## 2022-06-23 DIAGNOSIS — R000 Tachycardia, unspecified: Secondary | ICD-10-CM | POA: Diagnosis not present

## 2022-06-23 DIAGNOSIS — Z8673 Personal history of transient ischemic attack (TIA), and cerebral infarction without residual deficits: Secondary | ICD-10-CM | POA: Insufficient documentation

## 2022-06-23 DIAGNOSIS — J811 Chronic pulmonary edema: Secondary | ICD-10-CM | POA: Diagnosis not present

## 2022-06-23 DIAGNOSIS — I25119 Atherosclerotic heart disease of native coronary artery with unspecified angina pectoris: Secondary | ICD-10-CM

## 2022-06-23 LAB — TROPONIN I (HIGH SENSITIVITY)
Troponin I (High Sensitivity): 42 ng/L — ABNORMAL HIGH (ref <18)
Troponin I (High Sensitivity): 40 ng/L — ABNORMAL HIGH (ref <18)

## 2022-06-23 LAB — CBC WITH DIFFERENTIAL/PLATELET
Abs Immature Granulocytes: 0.04 10*3/uL (ref 0.00–0.07)
Basophils Absolute: 0.1 10*3/uL (ref 0.0–0.1)
Basophils Relative: 1 %
Eosinophils Absolute: 0.2 10*3/uL (ref 0.0–0.5)
Eosinophils Relative: 2 %
HCT: 39.6 % (ref 39.0–52.0)
Hemoglobin: 11.7 g/dL — ABNORMAL LOW (ref 13.0–17.0)
Immature Granulocytes: 0 %
Lymphocytes Relative: 17 %
Lymphs Abs: 1.8 10*3/uL (ref 0.7–4.0)
MCH: 26.7 pg (ref 26.0–34.0)
MCHC: 29.5 g/dL — ABNORMAL LOW (ref 30.0–36.0)
MCV: 90.2 fL (ref 80.0–100.0)
Monocytes Absolute: 1.1 10*3/uL — ABNORMAL HIGH (ref 0.1–1.0)
Monocytes Relative: 11 %
Neutro Abs: 7 10*3/uL (ref 1.7–7.7)
Neutrophils Relative %: 69 %
Platelets: 294 10*3/uL (ref 150–400)
RBC: 4.39 MIL/uL (ref 4.22–5.81)
RDW: 18.6 % — ABNORMAL HIGH (ref 11.5–15.5)
WBC: 10.2 10*3/uL (ref 4.0–10.5)
nRBC: 0 % (ref 0.0–0.2)

## 2022-06-23 LAB — HEPATITIS B SURFACE ANTIGEN: Hepatitis B Surface Ag: NONREACTIVE

## 2022-06-23 LAB — COMPREHENSIVE METABOLIC PANEL
ALT: 17 U/L (ref 0–44)
AST: 27 U/L (ref 15–41)
Albumin: 3.4 g/dL — ABNORMAL LOW (ref 3.5–5.0)
Alkaline Phosphatase: 129 U/L — ABNORMAL HIGH (ref 38–126)
Anion gap: 12 (ref 5–15)
BUN: 28 mg/dL — ABNORMAL HIGH (ref 8–23)
CO2: 28 mmol/L (ref 22–32)
Calcium: 9.6 mg/dL (ref 8.9–10.3)
Chloride: 96 mmol/L — ABNORMAL LOW (ref 98–111)
Creatinine, Ser: 3.45 mg/dL — ABNORMAL HIGH (ref 0.61–1.24)
GFR, Estimated: 19 mL/min — ABNORMAL LOW (ref >60)
Glucose, Bld: 267 mg/dL — ABNORMAL HIGH (ref 70–99)
Potassium: 4.1 mmol/L (ref 3.5–5.1)
Sodium: 136 mmol/L (ref 135–145)
Total Bilirubin: 0.7 mg/dL (ref 0.3–1.2)
Total Protein: 6.8 g/dL (ref 6.5–8.1)

## 2022-06-23 LAB — CBG MONITORING, ED
Glucose-Capillary: 265 mg/dL — ABNORMAL HIGH (ref 70–99)
Glucose-Capillary: 205 mg/dL — ABNORMAL HIGH (ref 70–99)

## 2022-06-23 MED ORDER — INSULIN ASPART 100 UNIT/ML IJ SOLN
0.0000 [IU] | Freq: Three times a day (TID) | INTRAMUSCULAR | Status: DC
  Administered 2022-06-23: 3 [IU] via SUBCUTANEOUS
  Administered 2022-06-23: 2 [IU] via SUBCUTANEOUS
  Filled 2022-06-23 (×2): qty 1

## 2022-06-23 MED ORDER — INSULIN GLARGINE-YFGN 100 UNIT/ML ~~LOC~~ SOLN
10.0000 [IU] | Freq: Every day | SUBCUTANEOUS | Status: DC
  Filled 2022-06-23: qty 0.1

## 2022-06-23 MED ORDER — TRAMADOL HCL 50 MG PO TABS
50.0000 mg | ORAL_TABLET | Freq: Once | ORAL | Status: AC
  Administered 2022-06-23: 50 mg via ORAL
  Filled 2022-06-23: qty 1

## 2022-06-23 MED ORDER — CHLORHEXIDINE GLUCONATE CLOTH 2 % EX PADS: 6.0000 | MEDICATED_PAD | Freq: Every day | CUTANEOUS | Status: DC

## 2022-06-23 MED ORDER — INSULIN ASPART 100 UNIT/ML IJ SOLN: 0.0000 [IU] | Freq: Every day | INTRAMUSCULAR | Status: DC

## 2022-06-23 MED ORDER — METOPROLOL TARTRATE 75 MG PO TABS: 75.0000 mg | ORAL_TABLET | Freq: Two times a day (BID) | ORAL | 5 refills | Status: DC

## 2022-06-23 MED ORDER — GLUCERNA SHAKE PO LIQD
237.0000 mL | Freq: Three times a day (TID) | ORAL | Status: DC
  Filled 2022-06-23 (×5): qty 237

## 2022-06-23 MED ORDER — ASPIRIN 325 MG PO TBEC: 325.0000 mg | DELAYED_RELEASE_TABLET | Freq: Every day | ORAL | 3 refills | Status: DC

## 2022-06-23 MED ORDER — ONDANSETRON 8 MG PO TBDP
8.0000 mg | ORAL_TABLET | Freq: Once | ORAL | Status: AC
  Administered 2022-06-23: 8 mg via ORAL
  Filled 2022-06-23: qty 1

## 2022-06-23 MED ORDER — HYDRALAZINE HCL 50 MG PO TABS: 50.0000 mg | ORAL_TABLET | Freq: Three times a day (TID) | ORAL | 3 refills | Status: DC

## 2022-06-23 MED ORDER — MORPHINE SULFATE (PF) 4 MG/ML IV SOLN
4.0000 mg | Freq: Once | INTRAVENOUS | Status: AC
  Administered 2022-06-23: 4 mg via INTRAVENOUS
  Filled 2022-06-23: qty 1

## 2022-06-23 NOTE — Discharge Instructions (Signed)
1)Change Metoprolol to 75 mg twice daily 2)Follow up with Dr. [name] at [facility] for your endoscopy and treatment of esophageal cancer 3)Follow up with your cardiologist within the week for recheck and reevaluation

## 2022-06-23 NOTE — ED Triage Notes (Signed)
Rcems from home .  Ems was called out for CP and SOB. Upon arrival ems states patient was in SVT. They gave '5mg'$  Cardizem and a 559m NS bolus in a 18g Right AC. Said that it went down to the 80s and then went back up for 180s.  Pt is al dialysis patient and has a left arm fistula. Last treatment was yesterday.  Reports a 9/10 chest pain.   Hr now 763

## 2022-06-23 NOTE — H&P (Signed)
History and Physical    Patient: [name] QJF:[id number] DOB: 12/17/1955 DOA: 06/23/2022 DOS: the patient was seen and examined on 06/23/2022 PCP: [name], MD  Patient coming from: Home  Chief Complaint:  Chief Complaint  Patient presents with   SVT   HPI: [name] is a 67 y.o. male with medical history significant of hypertension, CAD s/p CABG, combined systolic and diastolic CHF, S9HT, SVT, ESRD on HD (MWF), OSA, esophageal adenocarcinoma, GERD who presents to the emergency department due to chest pain which started around 9 PM last night.  Pain was described as "elephant sitting on left side of my chest".  Chest pain was reproducible, nonradiating and was rated as 9/10 on pain scale, it was associated with lightheadedness, shortness of breath.  EMS was activated and on arrival of EMS team, patient was found to be in SVT, but this quickly reverted back to normal sinus rhythm.  Patient denies fever, chills, nausea, vomiting, diaphoresis.  Last dialysis was yesterday.  ED Course:  In the emergency department, BP was 157/91, respiratory rate 21/min, Pulse 46 bpm, temperature 97.63F, O2 sat 94%.  Workup in the ED showed normocytic anemia, BMP was normal except for chloride of 96 and blood glucose of 267, BUN/creatinine 28/3.45, albumin 3.4, troponin 42 Chest x-ray showed moderate right pleural effusion. Associated right basilar opacity favored to reflect atelectasis/effusion, although infiltrate could be considered in the correct clinical setting. Cardiomegaly with mild diffuse pulmonary vascular congestion without overt pulmonary edema. IV morphine 4 mg tabs was given, Zofran was given.  Hospitalist was asked to admit patient for further evaluation and management.  Review of Systems: Review of systems as noted in the HPI. All other systems reviewed and are negative.   Past Medical History:  Diagnosis Date   Anemia    Arthritis    hips shoulders   CAD (coronary artery  disease) 03/03/2019   Cancer ([place])    Esophageal cancer 2022   CHF (congestive heart failure) ([place]) 2020   Chronic combined systolic and diastolic heart failure ([place]) 04/26/2018   Admitted 11/14-11/20/19-diuresed 10L   Depression 05/31/2021   Fatigue 10/14/2020   Hypertension    Myocardial infarction [facility])    Sleep apnea    uses a bipap machine   Stroke ([place]) 12/14/2020   no last weakness or paralysis   Type 2 diabetes mellitus with diabetic nephropathy ([place]) 04/23/2019   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   Past Surgical History:  Procedure Laterality Date   AV FISTULA PLACEMENT Left 12/21/2021   Procedure: LEFT ARM ARTERIOVENOUS (AV) FISTULA CREATION;  Surgeon: Rosetta Posner, MD;  Location: [facility];  Service: Vascular;  Laterality: Left;   BIOPSY  06/27/2019   Procedure: BIOPSY;  Surgeon: Rogene Houston, MD;  [facility];  Service: Endoscopy;;  ascending colon polyp   BIOPSY  10/21/2020   Procedure: BIOPSY;  Surgeon: Rogene Houston, MD;  [facility];  Service: Endoscopy;;  esophageal,gastric polyp   BIOPSY  02/24/2021   Procedure: BIOPSY;  Surgeon: Rogene Houston, MD;  [facility];  Service: Endoscopy;;  distal and proximal esophageal biopsies    BIOPSY  05/30/2022   Procedure: BIOPSY;  Surgeon: Eloise Harman, DO;  [facility];  Service: Endoscopy;;   CARDIAC SURGERY     CATARACT EXTRACTION     COLONOSCOPY N/A 06/27/2019   [name]:Diverticulosis in the entire examined colon. tubular adenoma in ascending colon, 58m tubular  adenoma in prox sigmoid. external hemorrhoids   CORONARY ARTERY BYPASS GRAFT N/A 03/25/2019   Procedure: CORONARY ARTERY BYPASS GRAFTING (CABG) X  4 USING LEFT INTERNAL MAMMARY ARTERY AND RIGHT SAPHENOUS VEIN GRAFTS;  Surgeon: [name], MD;  Location: [place];  Service: Open Heart Surgery;  Laterality: N/A;   ESOPHAGEAL BRUSHING  03/08/2022   Procedure: ESOPHAGEAL BRUSHING;  Surgeon: Eloise Harman, DO;  [facility];  Service: Endoscopy;;   ESOPHAGOGASTRODUODENOSCOPY (EGD) WITH PROPOFOL N/A 10/21/2020   rehman:Normal hypopharynx.Normal proximal esophagus and mid esophagus.Esophageal mucosal changes consistent with long-segment Barrett's esophagus. (Focal low-grade dysplasia and atypia proximally) z line irregular 37 cm from incisors, 3 cm HH, single gastric polyp (fundic gland) normal duodenal bulb/second portion of duodenum, proximal margin of Barrett's at 34 cm   ESOPHAGOGASTRODUODENOSCOPY (EGD) WITH PROPOFOL N/A 02/24/2021   Procedure: ESOPHAGOGASTRODUODENOSCOPY (EGD) WITH PROPOFOL;  Surgeon: Rogene Houston, MD;  [facility];  Service: Endoscopy;  Laterality: N/A;  1:35   ESOPHAGOGASTRODUODENOSCOPY (EGD) WITH PROPOFOL N/A 03/21/2021   Procedure: ESOPHAGOGASTRODUODENOSCOPY (EGD) WITH PROPOFOL;  Surgeon: Carol Ada, MD;  Location: [place];  Service: Endoscopy;  Laterality: N/A;   ESOPHAGOGASTRODUODENOSCOPY (EGD) WITH PROPOFOL N/A 10/23/2021   Procedure: ESOPHAGOGASTRODUODENOSCOPY (EGD) WITH PROPOFOL;  Surgeon: Doran Stabler, MD;  Location: [place];  Service: Gastroenterology;  Laterality: N/A;   ESOPHAGOGASTRODUODENOSCOPY (EGD) WITH PROPOFOL N/A 03/08/2022   Procedure: ESOPHAGOGASTRODUODENOSCOPY (EGD) WITH PROPOFOL;  Surgeon: Eloise Harman, DO;  [facility];  Service: Endoscopy;  Laterality: N/A;   ESOPHAGOGASTRODUODENOSCOPY (EGD) WITH PROPOFOL N/A 05/27/2022   Procedure: ESOPHAGOGASTRODUODENOSCOPY (EGD) WITH PROPOFOL;  Surgeon: Harvel Quale, MD;  [facility];  Service: Gastroenterology;  Laterality: N/A;   ESOPHAGOGASTRODUODENOSCOPY (EGD) WITH PROPOFOL N/A 05/30/2022   Procedure: ESOPHAGOGASTRODUODENOSCOPY (EGD) WITH PROPOFOL;  Surgeon: Eloise Harman, DO;  [facility];  Service: Endoscopy;  Laterality: N/A;   EXCISION MORTON'S NEUROMA     EYE SURGERY Left    retina   INCISION AND  DRAINAGE OF WOUND Right 06/28/2021   Procedure: IRRIGATION AND DEBRIDEMENT WOUND;  Surgeon: [name], MD;  Location: [place];  Service: Plastics;  Laterality: Right;  1.5 hour   INSERTION OF DIALYSIS CATHETER Right 12/21/2021   Procedure: INSERTION OF TUNNELED DIALYSIS CATHETER;  Surgeon: Rosetta Posner, MD;  [facility];  Service: Vascular;  Laterality: Right;   LEFT HEART CATH AND CORS/GRAFTS ANGIOGRAPHY N/A 02/01/2022   Procedure: LEFT HEART CATH AND CORS/GRAFTS ANGIOGRAPHY;  Surgeon: Martinique, Peter M, MD;  Location: [place] [facility];  Service: Cardiovascular;  Laterality: N/A;   POLYPECTOMY  06/27/2019   Procedure: POLYPECTOMY;  Surgeon: Rogene Houston, MD;  [facility];  Service: Endoscopy;;  proximal sigmoid colon   RIGHT/LEFT HEART CATH AND CORONARY ANGIOGRAPHY N/A 03/12/2019   Procedure: RIGHT/LEFT HEART CATH AND CORONARY ANGIOGRAPHY;  Surgeon: [name], MD;  Location: [place] [facility];  Service: Cardiovascular;  Laterality: N/A;   SHOULDER ARTHROSCOPY WITH ROTATOR CUFF REPAIR AND SUBACROMIAL DECOMPRESSION Right 08/26/2020   Procedure: RIGHT SHOULDER MINI OPEN ROTATOR CUFF REPAIR AND SUBACROMIAL DECOMPRESSION WITH PATCH GRAFT;  Surgeon: Susa Day, MD;  Location: [facility];  Service: Orthopedics;  Laterality: Right;  90 MINS GENERAL WITH BLOCK   SKIN SPLIT GRAFT Right 06/28/2021   Procedure: SKIN GRAFT SPLIT THICKNESS;  Surgeon: [name], MD;  Location: [place];  Service: Plastics;  Laterality: Right;   TEE WITHOUT CARDIOVERSION N/A  03/25/2019   Procedure: TRANSESOPHAGEAL ECHOCARDIOGRAM (TEE);  Surgeon: [name], MD;  Location: [place];  Service: Open Heart Surgery;  Laterality: N/A;    Social History:  reports that he quit smoking about 33 years ago. His smoking use included cigarettes. He has been exposed to tobacco smoke. He has never used smokeless tobacco. He reports that he does not currently use alcohol after a past usage of about  2.0 standard drinks of alcohol per week. He reports that he does not use drugs.   Allergies  Allergen Reactions   Bee Venom Anaphylaxis   Atorvastatin Other (See Comments)    myalgia   Diltiazem Itching   Reglan [Metoclopramide] Other (See Comments)    Suicidal    Rosuvastatin Other (See Comments)    myalgias   Valsartan Itching    Family History  Problem Relation Age of Onset   Colon cancer Mother    Heart Problems Father    Diabetes Father    Valvular heart disease Father    Sleep apnea Neg Hx    Stroke Neg Hx      Prior to Admission medications   Medication Sig Start Date End Date Taking? Authorizing Provider  acetaminophen (TYLENOL) 325 MG tablet Take 2 tablets (650 mg total) by mouth every 6 (six) hours as needed for mild pain, moderate pain or fever. 08/02/21   Allie Bossier, MD  albuterol (VENTOLIN HFA) 108 (90 Base) MCG/ACT inhaler Inhale 2 puffs into the lungs every 6 (six) hours as needed. Patient taking differently: Inhale 2 puffs into the lungs every 6 (six) hours as needed for wheezing or shortness of breath. 06/24/21   Coral Spikes, DO  amiodarone (PACERONE) 200 MG tablet Take 1 tablet (200 mg total) by mouth daily. 200 mg once daily 05/05/22   Johnson, Clanford L, MD  ARIPiprazole (ABILIFY) 5 MG tablet Take 5 mg by mouth daily.    [provider]  ascorbic acid (VITAMIN C) 500 MG tablet Take 1 tablet (500 mg total) by mouth daily. 08/03/21   Allie Bossier, MD  aspirin EC 325 MG tablet Take 325 mg by mouth daily. 02/21/22   [provider]  calcium acetate (PHOSLO) 667 MG capsule Take 667 mg by mouth in the morning, at noon, and at bedtime.    [provider]  calcium-vitamin D (OSCAL WITH D) 500-5 MG-MCG tablet Take 1 tablet by mouth 2 (two) times daily.    [provider]  doxazosin (CARDURA) 2 MG tablet Take 1 tablet (2 mg total) by mouth daily. 03/15/22   [name], MD  EPINEPHrine 0.3 mg/0.3 mL IJ SOAJ injection Inject  0.3 mg into the muscle as needed for anaphylaxis. 01/25/21   [name], MD  hydrALAZINE (APRESOLINE) 50 MG tablet TAKE 1 TABLET BY MOUTH EVERY 8 HOURS 04/13/22   [name], [name], MD  isosorbide dinitrate (ISORDIL) 30 MG tablet Take 30 mg by mouth 3 (three) times daily. 02/21/22   [provider]  LANTUS 100 UNIT/ML injection 8 mLs daily. Sliding scale 04/07/22   [provider]  metoprolol succinate (TOPROL-XL) 50 MG 24 hr tablet TAKE 1 TABLET BY MOUTH DAILY with OR immediately AFTER a meal 05/31/22   [name], [name], MD  Multiple Vitamins-Minerals (THERATRUM COMPLETE) TABS Take 1 tablet by mouth daily.    [provider]  mupirocin ointment (BACTROBAN) 2 % Apply thin amount twice daily to open wounds on the legs 05/12/22   [name],  [name], MD  nitroGLYCERIN (NITROSTAT) 0.4 MG SL tablet Place 1 tablet (0.4 mg total) under the tongue every 5 (five) minutes as needed for chest pain. 09/14/21   Loel Dubonnet, NP  Nutritional Supplements (FEEDING SUPPLEMENT, NEPRO CARB STEADY,) LIQD Take 237 mLs by mouth 2 (two) times daily with a meal.    [provider]  ondansetron (ZOFRAN-ODT) 4 MG disintegrating tablet TAKE 1 TABLET BY MOUTH EVERY 8 HOURS AS NEEDED FOR NAUSEA AND VOMITING 06/14/22   [name], MD  pantoprazole (PROTONIX) 40 MG tablet Take 1 tablet (40 mg total) by mouth 2 (two) times daily. 05/30/22   Orson Eva, MD  REPATHA SURECLICK 295 MG/ML SOAJ Inject 140 mg as directed every 14 (fourteen) days. Patient taking differently: Inject 140 mg as directed every 14 (fourteen) days. Starting 02/08/22 09/14/21   Loel Dubonnet, NP  sertraline (ZOLOFT) 100 MG tablet 1 daily Patient taking differently: Take 100 mg by mouth daily. 02/11/22   [name], MD    Physical Exam: BP (!) 146/89   Pulse 87   Temp 97.6 F (36.4 C) (Oral)   Resp 12   Ht '5\' 10"'$  (1.778 m)   Wt 79 kg   SpO2 96%   BMI 24.99 kg/m   General: 67 y.o. year-old male well developed  well nourished in no acute distress.  Alert and oriented x3. HEENT: NCAT, EOMI Neck: Supple, trachea medial Cardiovascular: Regular rate and rhythm with no rubs or gallops.  No thyromegaly or JVD noted.  +2 lower extremity edema. 2/4 pulses in all 4 extremities. Respiratory: Clear to auscultation with no wheezes or rales. Good inspiratory effort. Abdomen: Soft, nontender nondistended with normal bowel sounds x4 quadrants. Muskuloskeletal: No cyanosis or clubbing noted bilaterally Neuro: CN II-XII intact, strength 5/5 x 4, sensation, reflexes intact Skin: Noted abrasion on legs.  Warm and dry Psychiatry: Judgement and insight appear normal. Mood is appropriate for condition and setting          Labs on Admission:  Basic Metabolic Panel: Recent Labs  Lab 06/23/22 0335  NA 136  K 4.1  CL 96*  CO2 28  GLUCOSE 267*  BUN 28*  CREATININE 3.45*  CALCIUM 9.6   Liver Function Tests: Recent Labs  Lab 06/23/22 0335  AST 27  ALT 17  ALKPHOS 129*  BILITOT 0.7  PROT 6.8  ALBUMIN 3.4*   No results for input(s): "LIPASE", "AMYLASE" in the last 168 hours. No results for input(s): "AMMONIA" in the last 168 hours. CBC: Recent Labs  Lab 06/23/22 0335  WBC 10.2  NEUTROABS 7.0  HGB 11.7*  HCT 39.6  MCV 90.2  PLT 294   Cardiac Enzymes: No results for input(s): "CKTOTAL", "CKMB", "CKMBINDEX", "TROPONINI" in the last 168 hours.  BNP (last 3 results) Recent Labs    01/29/22 2150 02/28/22 1249 05/03/22 1408  BNP 1,906.0* >4,500.0* >4,500.0*    ProBNP (last 3 results) No results for input(s): "PROBNP" in the last 8760 hours.  CBG: No results for input(s): "GLUCAP" in the last 168 hours.  Radiological Exams on Admission: DG Chest Port 1 View  Result Date: 06/23/2022 CLINICAL DATA:  Initial evaluation for acute chest pain. EXAM: PORTABLE CHEST 1 VIEW COMPARISON:  Prior radiograph from 05/03/2022. FINDINGS: Dual lumen right IJ approach central venous catheter in place with tip  overlying the right atrium. Median sternotomy wires underlying CABG markers noted. Cardiomegaly, stable. Mediastinal silhouette within normal limits. Lungs normally inflated. Mild diffuse pulmonary vascular congestion  without overt pulmonary edema. Moderate right pleural effusion. Associated right basilar opacity favored to reflect atelectasis/effusion. Infiltrate not excluded. No pneumothorax. Osseous structures demonstrate no acute finding. IMPRESSION: 1. Moderate right pleural effusion. Associated right basilar opacity favored to reflect atelectasis/effusion, although infiltrate could be considered in the correct clinical setting. 2. Cardiomegaly with mild diffuse pulmonary vascular congestion without overt pulmonary edema. Electronically Signed   By: Jeannine Boga M.D.   On: 06/23/2022 04:05    EKG: I independently viewed the EKG done and my findings are as followed: SVT at a rate of 177 bpm with QTc of 527 ms  Assessment/Plan Present on Admission:  Atypical chest pain  SVT (supraventricular tachycardia)  Hyperglycemia due to diabetes mellitus (HCC)  GERD (gastroesophageal reflux disease)  Elevated troponin  Essential hypertension, benign  Coronary artery disease involving coronary bypass graft of native heart with other forms of angina pectoris (HCC)  CKD (chronic kidney disease) stage 4, GFR 15-29 ml/min (HCC)  Chronic combined systolic and diastolic CHF (congestive heart failure) (HCC)  Principal Problem:   Atypical chest pain Active Problems:   CKD (chronic kidney disease) stage 4, GFR 15-29 ml/min (HCC)   ESRD on dialysis (HCC)   Chronic combined systolic and diastolic CHF (congestive heart failure) (HCC)   SVT (supraventricular tachycardia)   Essential hypertension, benign   Coronary artery disease involving coronary bypass graft of native heart with other forms of angina pectoris (HCC)   Elevated troponin   S/P CABG (coronary artery bypass graft)   GERD (gastroesophageal  reflux disease)   Hyperglycemia due to diabetes mellitus (HCC)   Atypical Chest Pain, rule out ACS Elevated troponin possibly due to type II demand ischemia Cardiovascular risk factors include hypertension, CAD s/p CABG, combined systolic and diastolic CHF, S9FW, Continue telemetry  Troponins x 2 - 42  > 40(this was 53 on 03/01/2022) EKG showed SVT at a rate of 177 bpm with QTc of 527 ms Cardiology will be consulted to help decide if Stress test is needed in am Versus other  diagnostic modalities.    Chest pain improved with IV morphine given  History of SVT Continue amiodarone, Toprol-XL  T2DM with hyperglycemia Hemoglobin A1c was 7.7 about 4 months ago Continue Semglee 10 units nightly and adjust dose accordingly Continue ISS and hypoglycemia protocol  Hypoalbuminemia possibly secondary to mild protein caloric malnutrition Albumin 3.4, protein supplement will be provided  Abrasion on legs Continue wound care  CAD status post CABG Cardiac cath done on 01/2021 showed patent grafts, severe three-vessel native CAD History of CABG 2020 Chest pain resolved with IV morphine given in the ED Continue Isordil, Toprol-XL  GERD Continue Protonix  Chronic combined systolic and diastolic CHF Echocardiogram done on 03/01/2022 showed LVEF of 40 to 45%, global hypokinesis, RV overload Currently euvolemic, patient will continue with dialysis for any fluid removal  Essential hypertension Continue hydralazine, Toprol-XL  Esophageal adenocarcinoma Patient follows with GI at [facility], Dr. [name] per medical record  Chronic kidney disease stage IV - patient on HD (MWF) Last dialysis was yesterday and was normal Neurology will be consulted for maintenance dialysis Continue PhosLo  Other Home meds: Doxazosin, albuterol  DVT prophylaxis: SCDs (chemoprophylaxis was not started due to recent history of hematemesis)  Code Status: DNR  Family Communication: None at  bedside  Consults: Cardiology, nephrology  Severity of Illness: The appropriate patient status for this patient is OBSERVATION. Observation status is judged to be reasonable and necessary in order to provide the required intensity  of service to ensure the patient's safety. The patient's presenting symptoms, physical exam findings, and initial radiographic and laboratory data in the context of their medical condition is felt to place them at decreased risk for further clinical deterioration. Furthermore, it is anticipated that the patient will be medically stable for discharge from the hospital within 2 midnights of admission.   Author: Bernadette Hoit, DO 06/23/2022 7:03 AM  For on call review [email or web address].

## 2022-06-23 NOTE — Consult Note (Signed)
CARDIOLOGY CONSULT NOTE    Patient ID: [name]; [id number]; [birth year]   Admit date: 06/23/2022 Date of Consult: 06/23/2022  Primary Care Provider: [name], MD Primary Cardiologist: [name] Primary Electrophysiologist:  None   Patient Profile:   Patient is a 67 year old M who is being seen today for evaluation of chest pain at the request of Dr. Denton Brick.  History of Present Illness:   Patient is a 67 year old M known to have CAD s/p 4v CABG (LIMA to LAD, SVG to D2, OM and PLV) with LVEF 40 to 45%, ESRD DD, HTN, DM 2, SVT on metoprolol and amiodarone, esophageal adenocarcinoma (not a surgical candidate and currently undergoing palliative treatments), OSA, GERD is currently admitted to hospitalist team for evaluation of chest pain.  Patient reported that he was sitting on the couch and watching TV last night when he started to have sudden onset of palpitations associated with chest pain lasting for almost 4 to 5 hours before he called his best friend around 2 AM in the morning which prompted ER visit. Associated with dizziness/lightheadedness. The chest pain improved when he sat up and worsened on lying down position. He did take SL NTG 0.4 mg as needed tablets which did not improve his pain.  EKG showed normal sinus rhythm, ST changes which were old.  No evidence of new ST changes.  High-sensitivity troponins were flat, 42, 40. It was similar to the levels from 3 months ago.  Echo from 9/23 showed LVEF 40 to 45%, indeterminate diastolic parameters, RV systolic function is moderately reduced, mildly elevated PASP, LA and RA severely dilated.  Past Medical History:  Diagnosis Date   Anemia    Arthritis    hips shoulders   CAD (coronary artery disease) 03/03/2019   Cancer ([place])    Esophageal cancer 2022   CHF (congestive heart failure) ([place]) 2020   Chronic combined systolic and diastolic heart failure ([place]) 04/26/2018   Admitted 11/14-11/20/19-diuresed 10L    Depression 05/31/2021   Fatigue 10/14/2020   Hypertension    Myocardial infarction [facility])    Sleep apnea    uses a bipap machine   Stroke ([place]) 12/14/2020   no last weakness or paralysis   Type 2 diabetes mellitus with diabetic nephropathy ([place]) 04/23/2019   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    Past Surgical History:  Procedure Laterality Date   AV FISTULA PLACEMENT Left 12/21/2021   Procedure: LEFT ARM ARTERIOVENOUS (AV) FISTULA CREATION;  Surgeon: Rosetta Posner, MD;  [facility]: [facility];  Service: Vascular;  Laterality: Left;   BIOPSY  06/27/2019   Procedure: BIOPSY;  Surgeon: Rogene Houston, MD;  [facility];  Service: Endoscopy;;  ascending colon polyp   BIOPSY  10/21/2020   Procedure: BIOPSY;  Surgeon: Rogene Houston, MD;  [facility];  Service: Endoscopy;;  esophageal,gastric polyp   BIOPSY  02/24/2021   Procedure: BIOPSY;  Surgeon: Rogene Houston, MD;  [facility];  Service: Endoscopy;;  distal and proximal esophageal biopsies    BIOPSY  05/30/2022   Procedure: BIOPSY;  Surgeon: Eloise Harman, DO;  [facility];  Service: Endoscopy;;   CARDIAC SURGERY     CATARACT EXTRACTION     COLONOSCOPY N/A 06/27/2019   [name]:Diverticulosis in the entire examined colon. tubular adenoma in ascending colon, 20m tubular adenoma in prox sigmoid. external hemorrhoids   CORONARY ARTERY BYPASS GRAFT N/A 03/25/2019   Procedure: CORONARY ARTERY BYPASS  GRAFTING (CABG) X  4 USING LEFT INTERNAL MAMMARY ARTERY AND RIGHT SAPHENOUS VEIN GRAFTS;  Surgeon: [name], MD;  Location: [place];  Service: Open Heart Surgery;  Laterality: N/A;   ESOPHAGEAL BRUSHING  03/08/2022   Procedure: ESOPHAGEAL BRUSHING;  Surgeon: Eloise Harman, DO;  [facility];  Service: Endoscopy;;   ESOPHAGOGASTRODUODENOSCOPY (EGD) WITH PROPOFOL N/A 10/21/2020   rehman:Normal hypopharynx.Normal proximal esophagus and mid esophagus.Esophageal mucosal changes  consistent with long-segment Barrett's esophagus. (Focal low-grade dysplasia and atypia proximally) z line irregular 37 cm from incisors, 3 cm HH, single gastric polyp (fundic gland) normal duodenal bulb/second portion of duodenum, proximal margin of Barrett's at 34 cm   ESOPHAGOGASTRODUODENOSCOPY (EGD) WITH PROPOFOL N/A 02/24/2021   Procedure: ESOPHAGOGASTRODUODENOSCOPY (EGD) WITH PROPOFOL;  Surgeon: Rogene Houston, MD;  [facility];  Service: Endoscopy;  Laterality: N/A;  1:35   ESOPHAGOGASTRODUODENOSCOPY (EGD) WITH PROPOFOL N/A 03/21/2021   Procedure: ESOPHAGOGASTRODUODENOSCOPY (EGD) WITH PROPOFOL;  Surgeon: Carol Ada, MD;  Location: [place];  Service: Endoscopy;  Laterality: N/A;   ESOPHAGOGASTRODUODENOSCOPY (EGD) WITH PROPOFOL N/A 10/23/2021   Procedure: ESOPHAGOGASTRODUODENOSCOPY (EGD) WITH PROPOFOL;  Surgeon: Doran Stabler, MD;  Location: [place];  Service: Gastroenterology;  Laterality: N/A;   ESOPHAGOGASTRODUODENOSCOPY (EGD) WITH PROPOFOL N/A 03/08/2022   Procedure: ESOPHAGOGASTRODUODENOSCOPY (EGD) WITH PROPOFOL;  Surgeon: Eloise Harman, DO;  [facility];  Service: Endoscopy;  Laterality: N/A;   ESOPHAGOGASTRODUODENOSCOPY (EGD) WITH PROPOFOL N/A 05/27/2022   Procedure: ESOPHAGOGASTRODUODENOSCOPY (EGD) WITH PROPOFOL;  Surgeon: Harvel Quale, MD;  [facility];  Service: Gastroenterology;  Laterality: N/A;   ESOPHAGOGASTRODUODENOSCOPY (EGD) WITH PROPOFOL N/A 05/30/2022   Procedure: ESOPHAGOGASTRODUODENOSCOPY (EGD) WITH PROPOFOL;  Surgeon: Eloise Harman, DO;  [facility];  Service: Endoscopy;  Laterality: N/A;   EXCISION MORTON'S NEUROMA     EYE SURGERY Left    retina   INCISION AND DRAINAGE OF WOUND Right 06/28/2021   Procedure: IRRIGATION AND DEBRIDEMENT WOUND;  Surgeon: [name], MD;  Location: [place];  Service: Plastics;  Laterality: Right;  1.5 hour   INSERTION OF DIALYSIS CATHETER Right 12/21/2021    Procedure: INSERTION OF TUNNELED DIALYSIS CATHETER;  Surgeon: Rosetta Posner, MD;  [facility];  Service: Vascular;  Laterality: Right;   LEFT HEART CATH AND CORS/GRAFTS ANGIOGRAPHY N/A 02/01/2022   Procedure: LEFT HEART CATH AND CORS/GRAFTS ANGIOGRAPHY;  Surgeon: Martinique, Peter M, MD;  Location: [place] [facility];  Service: Cardiovascular;  Laterality: N/A;   POLYPECTOMY  06/27/2019   Procedure: POLYPECTOMY;  Surgeon: Rogene Houston, MD;  [facility];  Service: Endoscopy;;  proximal sigmoid colon   RIGHT/LEFT HEART CATH AND CORONARY ANGIOGRAPHY N/A 03/12/2019   Procedure: RIGHT/LEFT HEART CATH AND CORONARY ANGIOGRAPHY;  Surgeon: [name], MD;  Location: [place] [facility];  Service: Cardiovascular;  Laterality: N/A;   SHOULDER ARTHROSCOPY WITH ROTATOR CUFF REPAIR AND SUBACROMIAL DECOMPRESSION Right 08/26/2020   Procedure: RIGHT SHOULDER MINI OPEN ROTATOR CUFF REPAIR AND SUBACROMIAL DECOMPRESSION WITH PATCH GRAFT;  Surgeon: Susa Day, MD;  Location: [facility];  Service: Orthopedics;  Laterality: Right;  90 MINS GENERAL WITH BLOCK   SKIN SPLIT GRAFT Right 06/28/2021   Procedure: SKIN GRAFT SPLIT THICKNESS;  Surgeon: [name], MD;  Location: [place];  Service: Plastics;  Laterality: Right;   TEE WITHOUT CARDIOVERSION N/A 03/25/2019   Procedure: TRANSESOPHAGEAL ECHOCARDIOGRAM (TEE);  Surgeon: [name], MD;  Location: [place];  Service: Open  Heart Surgery;  Laterality: N/A;     Home Medications:  Prior to Admission medications   Medication Sig Start Date End Date Taking? Authorizing Provider  acetaminophen (TYLENOL) 325 MG tablet Take 2 tablets (650 mg total) by mouth every 6 (six) hours as needed for mild pain, moderate pain or fever. 08/02/21  Yes Allie Bossier, MD  albuterol (VENTOLIN HFA) 108 (90 Base) MCG/ACT inhaler Inhale 2 puffs into the lungs every 6 (six) hours as needed. Patient taking differently: Inhale 2 puffs into the lungs every 6 (six)  hours as needed for wheezing or shortness of breath. 06/24/21  Yes Cook, Jayce G, DO  amiodarone (PACERONE) 200 MG tablet Take 1 tablet (200 mg total) by mouth daily. 200 mg once daily 05/05/22  Yes Johnson, Clanford L, MD  ascorbic acid (VITAMIN C) 500 MG tablet Take 1 tablet (500 mg total) by mouth daily. 08/03/21  Yes Allie Bossier, MD  aspirin EC 325 MG tablet Take 325 mg by mouth daily. 02/21/22  Yes [provider]  calcium acetate (PHOSLO) 667 MG capsule Take 667 mg by mouth in the morning, at noon, and at bedtime.   Yes [provider]  calcium-vitamin D (OSCAL WITH D) 500-5 MG-MCG tablet Take 1 tablet by mouth 2 (two) times daily.   Yes [provider]  doxazosin (CARDURA) 2 MG tablet Take 1 tablet (2 mg total) by mouth daily. 03/15/22  Yes [name], Scott A, MD  EPINEPHrine 0.3 mg/0.3 mL IJ SOAJ injection Inject 0.3 mg into the muscle as needed for anaphylaxis. 01/25/21  Yes [name], MD  hydrALAZINE (APRESOLINE) 50 MG tablet TAKE 1 TABLET BY MOUTH EVERY 8 HOURS 04/13/22  Yes [name], Scott A, MD  isosorbide dinitrate (ISORDIL) 30 MG tablet Take 30 mg by mouth 3 (three) times daily. 02/21/22  Yes [provider]  LANTUS 100 UNIT/ML injection 8 mLs daily. Sliding scale 04/07/22  Yes [provider]  metoprolol succinate (TOPROL-XL) 50 MG 24 hr tablet TAKE 1 TABLET BY MOUTH DAILY with OR immediately AFTER a meal 05/31/22  Yes [name], Scott A, MD  nitroGLYCERIN (NITROSTAT) 0.4 MG SL tablet Place 1 tablet (0.4 mg total) under the tongue every 5 (five) minutes as needed for chest pain. 09/14/21  Yes Loel Dubonnet, NP  pantoprazole (PROTONIX) 40 MG tablet Take 1 tablet (40 mg total) by mouth 2 (two) times daily. 05/30/22  Yes Tat, Shanon Brow, MD  sertraline (ZOLOFT) 100 MG tablet 1 daily Patient taking differently: Take 100 mg by mouth daily. 02/11/22  Yes [name], MD  Multiple Vitamins-Minerals (THERATRUM COMPLETE) TABS Take 1 tablet by mouth daily.     [provider]  mupirocin ointment (BACTROBAN) 2 % Apply thin amount twice daily to open wounds on the legs Patient not taking: Reported on 06/23/2022 05/12/22   [name], MD  ondansetron (ZOFRAN-ODT) 4 MG disintegrating tablet TAKE 1 TABLET BY MOUTH EVERY 8 HOURS AS NEEDED FOR NAUSEA AND VOMITING Patient not taking: Reported on 06/23/2022 06/14/22   [name], MD  REPATHA SURECLICK 419 MG/ML SOAJ Inject 140 mg as directed every 14 (fourteen) days. Patient not taking: Reported on 06/23/2022 09/14/21   Loel Dubonnet, NP    Inpatient Medications: Scheduled Meds:  Chlorhexidine Gluconate Cloth  6 each Topical Q0600   feeding supplement (GLUCERNA SHAKE)  237 mL Oral TID BM   insulin aspart  0-5 Units Subcutaneous QHS   insulin aspart  0-6 Units Subcutaneous TID  WC   insulin glargine-yfgn  10 Units Subcutaneous QHS   traMADol  50 mg Oral Once   Continuous Infusions:  PRN Meds:   Allergies:    Allergies  Allergen Reactions   Bee Venom Anaphylaxis   Atorvastatin Other (See Comments)    myalgia   Diltiazem Itching   Reglan [Metoclopramide] Other (See Comments)    Suicidal    Rosuvastatin Other (See Comments)    myalgias   Valsartan Itching    Social History:   Social History   Socioeconomic History   Marital status: Single    Spouse name: Not on file   Number of children: Not on file   Years of education: 12   Highest education level: 12th grade  Occupational History   Not on file  Tobacco Use   Smoking status: Former    Types: Cigarettes    Quit date: 07/06/1988    Years since quitting: 33.9    Passive exposure: Past   Smokeless tobacco: Never  Vaping Use   Vaping Use: Never used  Substance and Sexual Activity   Alcohol use: Not Currently    Alcohol/week: 2.0 standard drinks of alcohol    Types: 2 Glasses of wine per week    Comment: socially   Drug use: No   Sexual activity: Not Currently    Partners: Female  Other Topics Concern   Not  on file  Social History Narrative   Not on file   Social Determinants of Health   Financial Resource Strain: Low Risk  (04/21/2022)   Overall Financial Resource Strain (CARDIA)    Difficulty of Paying Living Expenses: Not hard at all  Food Insecurity: No Food Insecurity (04/21/2022)   Hunger Vital Sign    Worried About Running Out of Food in the Last Year: Never true    Ran Out of Food in the Last Year: Never true  Transportation Needs: Unmet Transportation Needs (06/03/2022)   PRAPARE - Hydrologist (Medical): Yes    Lack of Transportation (Non-Medical): No  Physical Activity: Inactive (04/21/2022)   Exercise Vital Sign    Days of Exercise per Week: 0 days    Minutes of Exercise per Session: 0 min  Stress: No Stress Concern Present (04/21/2022)   [place]    Feeling of Stress : Only a little  Social Connections: Unknown (04/21/2022)   Social Connection and Isolation Panel [NHANES]    Frequency of Communication with Friends and Family: More than three times a week    Frequency of Social Gatherings with Friends and Family: More than three times a week    Attends Religious Services: More than 4 times per year    Active Member of [employer] or Organizations: Yes    Attends Archivist Meetings: More than 4 times per year    Marital Status: Not on file  Intimate Partner Violence: Not At Risk (04/21/2022)   Humiliation, Afraid, Rape, and Kick questionnaire    Fear of Current or Ex-Partner: No    Emotionally Abused: No    Physically Abused: No    Sexually Abused: No    Family History:    Family History  Problem Relation Age of Onset   Colon cancer Mother    Heart Problems Father    Diabetes Father    Valvular heart disease Father    Sleep apnea Neg Hx    Stroke Neg Hx  ROS:  Please see the history of present illness.  ROS  All other ROS reviewed and negative.      Physical Exam/Data:   Vitals:   06/23/22 0600 06/23/22 0630 06/23/22 0807 06/23/22 1000  BP: (!) 162/91 (!) 146/89  (!) 166/89  Pulse: 88 87  90  Resp: '13 12  13  '$ Temp:   98.2 F (36.8 C)   TempSrc:   Oral   SpO2: 96% 96%  99%  Weight:      Height:       No intake or output data in the 24 hours ending 06/23/22 1155 Filed Weights   06/23/22 0314  Weight: 79 kg   Body mass index is 24.99 kg/m.  General:  Well nourished, well developed, in no acute distres HEENT: normal Lymph: no adenopathy Neck: no JVD Endocrine:  No thryomegaly Vascular: No carotid bruits; FA pulses 2+ bilaterally without bruits  Cardiac:  normal S1, S2; RRR; no murmur  Lungs:  clear to auscultation bilaterally, no wheezing, rhonchi or rales  Abd: soft, nontender, no hepatomegaly  Ext: no edema Musculoskeletal:  No deformities, BUE and BLE strength normal and equal Skin: warm and dry , trace pitting edema Neuro:  CNs 2-12 intact, no focal abnormalities noted Psych:  Normal affect   EKG:  The EKG was personally reviewed and demonstrates:  NSR and no new ST-T changes Telemetry:  Telemetry was personally reviewed and demonstrates:  NSR  Relevant CV Studies:   Laboratory Data:  Chemistry Recent Labs  Lab 06/23/22 0335  NA 136  K 4.1  CL 96*  CO2 28  GLUCOSE 267*  BUN 28*  CREATININE 3.45*  CALCIUM 9.6  GFRNONAA 19*  ANIONGAP 12    Recent Labs  Lab 06/23/22 0335  PROT 6.8  ALBUMIN 3.4*  AST 27  ALT 17  ALKPHOS 129*  BILITOT 0.7   Hematology Recent Labs  Lab 06/23/22 0335  WBC 10.2  RBC 4.39  HGB 11.7*  HCT 39.6  MCV 90.2  MCH 26.7  MCHC 29.5*  RDW 18.6*  PLT 294   Cardiac EnzymesNo results for input(s): "TROPONINI" in the last 168 hours. No results for input(s): "TROPIPOC" in the last 168 hours.  BNPNo results for input(s): "BNP", "PROBNP" in the last 168 hours.  DDimer No results for input(s): "DDIMER" in the last 168 hours.  Radiology/Studies:  DG Chest Port 1  View  Result Date: 06/23/2022 CLINICAL DATA:  Initial evaluation for acute chest pain. EXAM: PORTABLE CHEST 1 VIEW COMPARISON:  Prior radiograph from 05/03/2022. FINDINGS: Dual lumen right IJ approach central venous catheter in place with tip overlying the right atrium. Median sternotomy wires underlying CABG markers noted. Cardiomegaly, stable. Mediastinal silhouette within normal limits. Lungs normally inflated. Mild diffuse pulmonary vascular congestion without overt pulmonary edema. Moderate right pleural effusion. Associated right basilar opacity favored to reflect atelectasis/effusion. Infiltrate not excluded. No pneumothorax. Osseous structures demonstrate no acute finding. IMPRESSION: 1. Moderate right pleural effusion. Associated right basilar opacity favored to reflect atelectasis/effusion, although infiltrate could be considered in the correct clinical setting. 2. Cardiomegaly with mild diffuse pulmonary vascular congestion without overt pulmonary edema. Electronically Signed   By: Jeannine Boga M.D.   On: 06/23/2022 04:05    Assessment and Plan:   Patient is a 67 year old M known to have CAD s/p 4v CABG (LIMA to LAD, SVG to D2, OM and PLV) with LVEF 40 to 45%, ESRD DD, HTN, DM 2, SVT on metoprolol and amiodarone, esophageal adenocarcinoma (not  a surgical candidate and currently undergoing palliative treatments), OSA, GERD is currently admitted to hospitalist team for evaluation of chest pain.  # Palpitations associated with chest pain -Patient had multiple episodes of SVT in 02/2022 hospitalization where and he was started on amiodarone drip and carvedilol was switched to metoprolol. He was eventually discharged on metoprolol succinate 50 mg once daily and amiodarone 200 mg (tapering regimen).  EKG in the ER showed normal sinus rhythm and no evidence of SVT. He does have ST-T changes which were old. Switch metoprolol succinate to tartrate 75 mg twice daily and continue amiodarone 200 mg  once daily. 1 week event monitor upon discharge to quantify PVC burden and evaluation of SVT episodes. -Patient had multiple chest pain episodes in the last 1 year for which he underwent LHC in 01/2022 which showed severe multivessel CAD and patent grafts. Chest pain that happened last night was mostly positional, worsened in lying down position and improved when he sat up. This could be most likely from his GERD/esophageal cancer but definitely not ischemic in nature with the most recent LHC from 8/23 showing patent grafts and EKG showing no new ST-T changes on admission. No indication of further cardiac testing with stress test or LHC at this point.  # CAD s/p 4v CABG (LIMA to LAD, SVG to D2, OM and PLV) in 2020 with LVEF 40 to 45% (LHC from 8/23 showed patent grafts) -Continue aspirin 81 mg once daily -Intolerant to statins. Continue Repatha every 2 weeks. -Switch metoprolol succinate to tartrate 75 mg twice daily -Not a candidate for ACE/ARB/ARNI/SGLT2 inhibitor/MRA due to ESRD DD.  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  # History of SVT # Palpitations associated with chest pain on 06/22/2022 -Patient was initially on carvedilol that was switched to metoprolol for better rate control of SVT episodes.Switch metoprolol succinate to tartrate 75 mg twice daily due to palpitation episode lasting for 4 to 5 hours last night.  Continue amiodarone 200 mg once daily.  1 week event monitor upon discharge.  # ESRD DD -Continue dialysis sessions according to the schedule  CODE STATUS: DNR.  He will need of care discussion eventually.  I have spent a total of 45 minutes with patient reviewing chart , telemetry, EKGs, labs and examining patient as well as establishing an assessment and plan that was discussed with the patient.  > 50% of time was spent in direct patient care.      For questions or updates, please contact [facility] Please consult [email or web address] for contact info under Cardiology/STEMI.   Signed, Vangie Bicker, MD 06/23/2022 11:55 AM

## 2022-06-23 NOTE — ED Provider Notes (Signed)
[facility] Provider Note   CSN: [id number] Arrival date & time: 06/23/22  4128     History  Chief Complaint  Patient presents with   SVT    [name] is a 67 y.o. male.  Patient is a 67 year old male with extensive past medical history including end-stage renal disease on hemodialysis, coronary artery disease with CABG x 4, esophageal carcinoma, peripheral artery disease, diabetes, hyperlipidemia, GERD.  Patient presenting today with complaints of chest pain.  Patient started at about 9 PM with a "elephant sitting on the left side of his chest".  He reports palpitations.  EMS was called and patient found to be in SVT, then spontaneously reverted to sinus rhythm, then back to SVT, then back to sinus rhythm.  He arrives here in sinus rhythm, but complaining of ongoing pain to the left chest.  He describes shortness of breath, but no nausea or diaphoresis.  Last dialysis session was yesterday and was normal.  The history is provided by the patient.       Home Medications Prior to Admission medications   Medication Sig Start Date End Date Taking? Authorizing Provider  acetaminophen (TYLENOL) 325 MG tablet Take 2 tablets (650 mg total) by mouth every 6 (six) hours as needed for mild pain, moderate pain or fever. 08/02/21   Allie Bossier, MD  albuterol (VENTOLIN HFA) 108 (90 Base) MCG/ACT inhaler Inhale 2 puffs into the lungs every 6 (six) hours as needed. Patient taking differently: Inhale 2 puffs into the lungs every 6 (six) hours as needed for wheezing or shortness of breath. 06/24/21   Coral Spikes, DO  amiodarone (PACERONE) 200 MG tablet Take 1 tablet (200 mg total) by mouth daily. 200 mg once daily 05/05/22   Johnson, Clanford L, MD  ARIPiprazole (ABILIFY) 5 MG tablet Take 5 mg by mouth daily.    [provider]  ascorbic acid (VITAMIN C) 500 MG tablet Take 1 tablet (500 mg total) by mouth daily. 08/03/21   Allie Bossier, MD  aspirin EC 325 MG  tablet Take 325 mg by mouth daily. 02/21/22   [provider]  calcium acetate (PHOSLO) 667 MG capsule Take 667 mg by mouth in the morning, at noon, and at bedtime.    [provider]  calcium-vitamin D (OSCAL WITH D) 500-5 MG-MCG tablet Take 1 tablet by mouth 2 (two) times daily.    [provider]  doxazosin (CARDURA) 2 MG tablet Take 1 tablet (2 mg total) by mouth daily. 03/15/22   [name], MD  EPINEPHrine 0.3 mg/0.3 mL IJ SOAJ injection Inject 0.3 mg into the muscle as needed for anaphylaxis. 01/25/21   [name], MD  hydrALAZINE (APRESOLINE) 50 MG tablet TAKE 1 TABLET BY MOUTH EVERY 8 HOURS 04/13/22   [name], [name], MD  isosorbide dinitrate (ISORDIL) 30 MG tablet Take 30 mg by mouth 3 (three) times daily. 02/21/22   [provider]  LANTUS 100 UNIT/ML injection 8 mLs daily. Sliding scale 04/07/22   [provider]  metoprolol succinate (TOPROL-XL) 50 MG 24 hr tablet TAKE 1 TABLET BY MOUTH DAILY with OR immediately AFTER a meal 05/31/22   [name], [name], MD  Multiple Vitamins-Minerals (THERATRUM COMPLETE) TABS Take 1 tablet by mouth daily.    [provider]  mupirocin ointment (BACTROBAN) 2 % Apply thin amount twice daily to open wounds on the legs 05/12/22   [name], [name], MD  nitroGLYCERIN (NITROSTAT) 0.4 MG  SL tablet Place 1 tablet (0.4 mg total) under the tongue every 5 (five) minutes as needed for chest pain. 09/14/21   Loel Dubonnet, NP  Nutritional Supplements (FEEDING SUPPLEMENT, NEPRO CARB STEADY,) LIQD Take 237 mLs by mouth 2 (two) times daily with a meal.    [provider]  ondansetron (ZOFRAN-ODT) 4 MG disintegrating tablet TAKE 1 TABLET BY MOUTH EVERY 8 HOURS AS NEEDED FOR NAUSEA AND VOMITING 06/14/22   [name], MD  pantoprazole (PROTONIX) 40 MG tablet Take 1 tablet (40 mg total) by mouth 2 (two) times daily. 05/30/22   Orson Eva, MD  REPATHA SURECLICK 025 MG/ML SOAJ Inject 140 mg as directed every  14 (fourteen) days. Patient taking differently: Inject 140 mg as directed every 14 (fourteen) days. Starting 02/08/22 09/14/21   Loel Dubonnet, NP  sertraline (ZOLOFT) 100 MG tablet 1 daily Patient taking differently: Take 100 mg by mouth daily. 02/11/22   [name], MD      Allergies    Bee venom, Atorvastatin, Diltiazem, Reglan [metoclopramide], Rosuvastatin, and Valsartan    Review of Systems   Review of Systems  All other systems reviewed and are negative.   Physical Exam Updated Vital Signs BP 132/69   Resp 16   Ht '5\' 10"'$  (1.778 m)   Wt 79 kg   BMI 24.99 kg/m  Physical Exam Vitals and nursing note reviewed.  Constitutional:      General: He is not in acute distress.    Appearance: He is well-developed. He is not diaphoretic.     Comments: Patient is a chronically ill-appearing male in no acute distress.  HENT:     Head: Normocephalic and atraumatic.  Cardiovascular:     Rate and Rhythm: Normal rate and regular rhythm.     Heart sounds: No murmur heard.    No friction rub.  Pulmonary:     Effort: Pulmonary effort is normal. No respiratory distress.     Breath sounds: Normal breath sounds. No wheezing or rales.  Abdominal:     General: Bowel sounds are normal. There is no distension.     Palpations: Abdomen is soft.     Tenderness: There is no abdominal tenderness.  Musculoskeletal:        General: Normal range of motion.     Cervical back: Normal range of motion and neck supple.  Skin:    General: Skin is warm and dry.  Neurological:     Mental Status: He is alert and oriented to person, place, and time.     Coordination: Coordination normal.     ED Results / Procedures / Treatments   Labs (all labs ordered are listed, but only abnormal results are displayed) Labs Reviewed  COMPREHENSIVE METABOLIC PANEL  CBC WITH DIFFERENTIAL/PLATELET  TROPONIN I (HIGH SENSITIVITY)    EKG EKG Interpretation  Date/Time:  Thursday June 23 2022 03:10:16  EST Ventricular Rate:  91 PR Interval:  199 QRS Duration: 123 QT Interval:  395 QTC Calculation: 486 R Axis:   121 Text Interpretation: Sinus rhythm Multiple premature complexes, vent & supraven Nonspecific intraventricular conduction delay Nonspecific repol abnormality, diffuse leads No significant change since 05/27/2022 Confirmed by Veryl Speak [id number] on 06/23/2022 3:19:49 AM  Radiology No results found.  Procedures Procedures    Medications Ordered in ED Medications  ondansetron (ZOFRAN-ODT) disintegrating tablet 8 mg (has no administration in time range)  morphine (PF) 4 MG/ML injection 4 mg (has no administration in time range)  ED Course/ Medical Decision Making/ A&P  Patient is a 67 year old male with extensive past medical history as outlined in the HPI.  Patient presenting with complaints of chest pain.  He describes "an elephant sitting on the left side of his chest".  He was brought by EMS after going in and out of SVT in the ambulance.  He was given Cardizem.  Patient arrives here with stable vital signs and is afebrile.  Physical examination reveals heart to be regular rate and rhythm with diminished breath sounds on the right, but physical examination basically otherwise unremarkable.  Workup initiated including CBC, metabolic panel, troponin.  Laboratory studies consistent with his baseline as he is a dialysis patient.  He does have a mild elevation of his troponin, but I do feel as though this is chronic and related to his renal function.  Chest x-ray shows either an effusion or infiltrate in the right base.  This was present on prior chest x-ray and I suspect an effusion.  EKG shows a sinus rhythm with no acute changes, but was noted to go in and out of SVT on 2 occasions while here in the ER.  Patient was given morphine for pain and seems to be feeling somewhat better.  Due to his history of coronary artery disease with CABG, dialysis state, and going in and  out of SVT while here in the ER, I feel as though patient should be admitted for further observation and trending of troponin.  I have spoken with the hospitalist who agrees to admit.  Final Clinical Impression(s) / ED Diagnoses Final diagnoses:  None    Rx / DC Orders ED Discharge Orders     None         Veryl Speak, MD 06/23/22 [phone]

## 2022-06-23 NOTE — Discharge Summary (Signed)
[name], is a 67 y.o. male  DOB May 10, 1956  MRN [id number].  Admission date:  06/23/2022  Admitting Physician  Bernadette Hoit, DO  Discharge Date:  06/23/2022   Primary MD  [name], MD  Recommendations for primary care physician for things to follow:   1)Change Metoprolol to 75 mg twice daily 2)Follow up with Dr. [name] at [facility] for your endoscopy and treatment of esophageal cancer 3)Follow up with your cardiologist within the week for recheck and reevaluation  Admission Diagnosis  Chest pain [R07.9]   Discharge Diagnosis  Chest pain [R07.9]    Principal Problem:   Atypical chest pain Active Problems:   CKD (chronic kidney disease) stage 4, GFR 15-29 ml/min (HCC)   ESRD on dialysis ([place])   Chronic combined systolic and diastolic CHF (congestive heart failure) (HCC)   SVT (supraventricular tachycardia)   Essential hypertension, benign   Coronary artery disease involving coronary bypass graft of native heart with other forms of angina pectoris (HCC)   Elevated troponin   S/P CABG (coronary artery bypass graft)   GERD (gastroesophageal reflux disease)   Hyperglycemia due to diabetes mellitus ([place])      Past Medical History:  Diagnosis Date   Anemia    Arthritis    hips shoulders   CAD (coronary artery disease) 03/03/2019   Cancer ([place])    Esophageal cancer 2022   CHF (congestive heart failure) ([place]) 2020   Chronic combined systolic and diastolic heart failure ([place]) 04/26/2018   Admitted 11/14-11/20/19-diuresed 10L   Depression 05/31/2021   Fatigue 10/14/2020   Hypertension    Myocardial infarction [facility])    Sleep apnea    uses a bipap machine   Stroke ([place]) 12/14/2020   no last weakness or paralysis   Type 2 diabetes mellitus with diabetic nephropathy ([place]) 04/23/2019   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    Past Surgical History:  Procedure Laterality Date   AV  FISTULA PLACEMENT Left 12/21/2021   Procedure: LEFT ARM ARTERIOVENOUS (AV) FISTULA CREATION;  Surgeon: Rosetta Posner, MD;  Location: [facility];  Service: Vascular;  Laterality: Left;   BIOPSY  06/27/2019   Procedure: BIOPSY;  Surgeon: Rogene Houston, MD;  [facility];  Service: Endoscopy;;  ascending colon polyp   BIOPSY  10/21/2020   Procedure: BIOPSY;  Surgeon: Rogene Houston, MD;  [facility];  Service: Endoscopy;;  esophageal,gastric polyp   BIOPSY  02/24/2021   Procedure: BIOPSY;  Surgeon: Rogene Houston, MD;  [facility];  Service: Endoscopy;;  distal and proximal esophageal biopsies    BIOPSY  05/30/2022   Procedure: BIOPSY;  Surgeon: Eloise Harman, DO;  [facility];  Service: Endoscopy;;   CARDIAC SURGERY     CATARACT EXTRACTION     COLONOSCOPY N/A 06/27/2019   [name]:Diverticulosis in the entire examined colon. tubular adenoma in ascending colon, 33m tubular adenoma in prox sigmoid. external hemorrhoids   CORONARY ARTERY BYPASS GRAFT N/A 03/25/2019  Procedure: CORONARY ARTERY BYPASS GRAFTING (CABG) X  4 USING LEFT INTERNAL MAMMARY ARTERY AND RIGHT SAPHENOUS VEIN GRAFTS;  Surgeon: [name], MD;  Location: [place];  Service: Open Heart Surgery;  Laterality: N/A;   ESOPHAGEAL BRUSHING  03/08/2022   Procedure: ESOPHAGEAL BRUSHING;  Surgeon: Eloise Harman, DO;  [facility];  Service: Endoscopy;;   ESOPHAGOGASTRODUODENOSCOPY (EGD) WITH PROPOFOL N/A 10/21/2020   rehman:Normal hypopharynx.Normal proximal esophagus and mid esophagus.Esophageal mucosal changes consistent with long-segment Barrett's esophagus. (Focal low-grade dysplasia and atypia proximally) z line irregular 37 cm from incisors, 3 cm HH, single gastric polyp (fundic gland) normal duodenal bulb/second portion of duodenum, proximal margin of Barrett's at 34 cm   ESOPHAGOGASTRODUODENOSCOPY (EGD) WITH PROPOFOL N/A 02/24/2021   Procedure:  ESOPHAGOGASTRODUODENOSCOPY (EGD) WITH PROPOFOL;  Surgeon: Rogene Houston, MD;  [facility];  Service: Endoscopy;  Laterality: N/A;  1:35   ESOPHAGOGASTRODUODENOSCOPY (EGD) WITH PROPOFOL N/A 03/21/2021   Procedure: ESOPHAGOGASTRODUODENOSCOPY (EGD) WITH PROPOFOL;  Surgeon: Carol Ada, MD;  Location: [place];  Service: Endoscopy;  Laterality: N/A;   ESOPHAGOGASTRODUODENOSCOPY (EGD) WITH PROPOFOL N/A 10/23/2021   Procedure: ESOPHAGOGASTRODUODENOSCOPY (EGD) WITH PROPOFOL;  Surgeon: Doran Stabler, MD;  Location: [place];  Service: Gastroenterology;  Laterality: N/A;   ESOPHAGOGASTRODUODENOSCOPY (EGD) WITH PROPOFOL N/A 03/08/2022   Procedure: ESOPHAGOGASTRODUODENOSCOPY (EGD) WITH PROPOFOL;  Surgeon: Eloise Harman, DO;  [facility];  Service: Endoscopy;  Laterality: N/A;   ESOPHAGOGASTRODUODENOSCOPY (EGD) WITH PROPOFOL N/A 05/27/2022   Procedure: ESOPHAGOGASTRODUODENOSCOPY (EGD) WITH PROPOFOL;  Surgeon: Harvel Quale, MD;  [facility];  Service: Gastroenterology;  Laterality: N/A;   ESOPHAGOGASTRODUODENOSCOPY (EGD) WITH PROPOFOL N/A 05/30/2022   Procedure: ESOPHAGOGASTRODUODENOSCOPY (EGD) WITH PROPOFOL;  Surgeon: Eloise Harman, DO;  [facility];  Service: Endoscopy;  Laterality: N/A;   EXCISION MORTON'S NEUROMA     EYE SURGERY Left    retina   INCISION AND DRAINAGE OF WOUND Right 06/28/2021   Procedure: IRRIGATION AND DEBRIDEMENT WOUND;  Surgeon: [name], MD;  Location: [place];  Service: Plastics;  Laterality: Right;  1.5 hour   INSERTION OF DIALYSIS CATHETER Right 12/21/2021   Procedure: INSERTION OF TUNNELED DIALYSIS CATHETER;  Surgeon: Rosetta Posner, MD;  [facility];  Service: Vascular;  Laterality: Right;   LEFT HEART CATH AND CORS/GRAFTS ANGIOGRAPHY N/A 02/01/2022   Procedure: LEFT HEART CATH AND CORS/GRAFTS ANGIOGRAPHY;  Surgeon: Martinique, Peter M, MD;  Location: [place] [facility];  Service: Cardiovascular;   Laterality: N/A;   POLYPECTOMY  06/27/2019   Procedure: POLYPECTOMY;  Surgeon: Rogene Houston, MD;  [facility];  Service: Endoscopy;;  proximal sigmoid colon   RIGHT/LEFT HEART CATH AND CORONARY ANGIOGRAPHY N/A 03/12/2019   Procedure: RIGHT/LEFT HEART CATH AND CORONARY ANGIOGRAPHY;  Surgeon: [name], MD;  Location: [place] [facility];  Service: Cardiovascular;  Laterality: N/A;   SHOULDER ARTHROSCOPY WITH ROTATOR CUFF REPAIR AND SUBACROMIAL DECOMPRESSION Right 08/26/2020   Procedure: RIGHT SHOULDER MINI OPEN ROTATOR CUFF REPAIR AND SUBACROMIAL DECOMPRESSION WITH PATCH GRAFT;  Surgeon: Susa Day, MD;  [facility];  Service: Orthopedics;  Laterality: Right;  90 MINS GENERAL WITH BLOCK   SKIN SPLIT GRAFT Right 06/28/2021   Procedure: SKIN GRAFT SPLIT THICKNESS;  Surgeon: [name], MD;  Location: [place];  Service: Plastics;  Laterality: Right;   TEE WITHOUT CARDIOVERSION N/A 03/25/2019   Procedure: TRANSESOPHAGEAL ECHOCARDIOGRAM (TEE);  Surgeon: [name], MD;  [facility]  OR;  Service: Open Heart Surgery;  Laterality: N/A;       HPI  from the history and physical done on the day of admission:    HPI: LYDELL MOGA is a 67 y.o. male with medical history significant of hypertension, CAD s/p CABG, combined systolic and diastolic CHF, N8MV, SVT, ESRD on HD (MWF), OSA, esophageal adenocarcinoma, GERD who presents to the emergency department due to chest pain which started around 9 PM last night.  Pain was described as "elephant sitting on left side of my chest".  Chest pain was reproducible, nonradiating and was rated as 9/10 on pain scale, it was associated with lightheadedness, shortness of breath.  EMS was activated and on arrival of EMS team, patient was found to be in SVT, but this quickly reverted back to normal sinus rhythm.  Patient denies fever, chills, nausea, vomiting, diaphoresis.  Last dialysis was yesterday.   ED Course:  In the  emergency department, BP was 157/91, respiratory rate 21/min, Pulse 46 bpm, temperature 97.38F, O2 sat 94%.  Workup in the ED showed normocytic anemia, BMP was normal except for chloride of 96 and blood glucose of 267, BUN/creatinine 28/3.45, albumin 3.4, troponin 42 Chest x-ray showed moderate right pleural effusion. Associated right basilar opacity favored to reflect atelectasis/effusion, although infiltrate could be considered in the correct clinical setting. Cardiomegaly with mild diffuse pulmonary vascular congestion without overt pulmonary edema. IV morphine 4 mg tabs was given, Zofran was given.  Hospitalist was asked to admit patient for further evaluation and management.   Review of Systems: Review of systems as noted in the HPI. All other systems reviewed and are negative.     Hospital Course:   1)Atypical Chest Pain- -retrosternal area and epigastric discomfort in the setting of esophageal cancer H/o CAD status post CABG Cardiac cath done on 01/2021 showed patent grafts, severe three-vessel native CAD History of CABG 2020 Elevated troponin   Troponins x 2 - 42  > 40(this was 53 on 03/01/2022) EKG showed SVT at a rate of 177 bpm with QTc of 527 ms -Cardiology consult appreciated -As per cardiologist chest discomfort most likely due to esophageal cancer rather than ACS  -Cardiologist recommend changing to metoprolol 75 mg tartrate twice daily, continue isosorbide -Outpatient follow-up with cardiologist advised  2)History of SVT--stable, cardiology consult appreciated recommends continuing amiodarone and changing metoprolol Tartrate to 75 mg twice daily  3)Esophageal Cancer -----follow up with Dr. [name] at [facility] for Endoscopy and treatment of esophageal cancer   4)T2DM with hyperglycemia Hemoglobin A1c was 7.7 about 4 months ago -Continue preadmission insulin/diabetic regimen follow-up with PCP for further adjustments   5)GERD --retrosternal area and epigastric  discomfort in the setting of esophageal cancer Continue Protonix   6)Chronic combined systolic and diastolic CHF Echocardiogram done on 03/01/2022 showed LVEF of 40 to 45%, global hypokinesis, RV overload Currently euvolemic,  -Continue to use hemodialysis to maintain volume status  7)ESRD--continue hemodialysis on Mondays Wednesdays and Fridays, patient last had hemodialysis yesterday   8)HTN-Essential hypertension Continue hydralazine, isosorbide and metoprolol   Discharge Condition: Home in stable condition from the ED, -Discharge instructions and plan of care discussed with patient and patient's male friend/caregiver Ms [name] at bedside  Follow UP--GI, cardiology and nephrology   Follow-up Information     [name], MD Follow up in 1 week(s).   Specialty: Family Medicine Contact information: [address] [place] [address] [phone]  Skeet Latch, MD Follow up in 1 week(s).   Specialty: Cardiology Contact information: [address] [place] [address] [phone]                 Consults obtained -cardiology  Diet and Activity recommendation:  As advised  Discharge Instructions    Discharge Instructions     Call MD for:  difficulty breathing, headache or visual disturbances   Complete by: As directed    Call MD for:  persistant dizziness or light-headedness   Complete by: As directed    Call MD for:  persistant nausea and vomiting   Complete by: As directed    Call MD for:  temperature >100.4   Complete by: As directed    Diet - low sodium heart healthy   Complete by: As directed    Discharge instructions   Complete by: As directed    1)Change Metoprolol to 75 mg twice daily 2)Follow up with Dr. [name] at [facility] for your endoscopy and treatment of esophageal cancer 3)Follow up with your cardiologist within the week for recheck and reevaluation   Increase activity slowly   Complete by: As  directed    No wound care   Complete by: As directed         Discharge Medications     Allergies as of 06/23/2022       Reactions   Bee Venom Anaphylaxis   Atorvastatin Other (See Comments)   myalgia   Diltiazem Itching   Reglan [metoclopramide] Other (See Comments)   Suicidal    Rosuvastatin Other (See Comments)   myalgias   Valsartan Itching        Medication List     STOP taking these medications    metoprolol succinate 50 MG 24 hr tablet Commonly known as: TOPROL-XL   mupirocin ointment 2 % Commonly known as: BACTROBAN   ondansetron 4 MG disintegrating tablet Commonly known as: ZOFRAN-ODT       TAKE these medications    acetaminophen 325 MG tablet Commonly known as: TYLENOL Take 2 tablets (650 mg total) by mouth every 6 (six) hours as needed for mild pain, moderate pain or fever.   albuterol 108 (90 Base) MCG/ACT inhaler Commonly known as: VENTOLIN HFA Inhale 2 puffs into the lungs every 6 (six) hours as needed. What changed: reasons to take this   amiodarone 200 MG tablet Commonly known as: PACERONE Take 1 tablet (200 mg total) by mouth daily. 200 mg once daily   ascorbic acid 500 MG tablet Commonly known as: VITAMIN C Take 1 tablet (500 mg total) by mouth daily.   aspirin EC 325 MG tablet Take 1 tablet (325 mg total) by mouth daily with breakfast. What changed: when to take this   calcium acetate 667 MG capsule Commonly known as: PHOSLO Take 667 mg by mouth in the morning, at noon, and at bedtime.   calcium-vitamin D 500-5 MG-MCG tablet Commonly known as: OSCAL WITH D Take 1 tablet by mouth 2 (two) times daily.   doxazosin 2 MG tablet Commonly known as: CARDURA Take 1 tablet (2 mg total) by mouth daily.   EPINEPHrine 0.3 mg/0.3 mL Soaj injection Commonly known as: EPI-PEN Inject 0.3 mg into the muscle as needed for anaphylaxis.   hydrALAZINE 50 MG tablet Commonly known as: APRESOLINE Take 1 tablet (50 mg total) by mouth 3  (three) times daily. What changed: when to take this   isosorbide dinitrate 30 MG tablet Commonly known as: ISORDIL Take 30  mg by mouth 3 (three) times daily.   Lantus 100 UNIT/ML injection Generic drug: insulin glargine 8 mLs daily. Sliding scale   Metoprolol Tartrate 75 MG Tabs Take 75 mg by mouth 2 (two) times daily.   nitroGLYCERIN 0.4 MG SL tablet Commonly known as: NITROSTAT Place 1 tablet (0.4 mg total) under the tongue every 5 (five) minutes as needed for chest pain.   pantoprazole 40 MG tablet Commonly known as: PROTONIX Take 1 tablet (40 mg total) by mouth 2 (two) times daily.   Repatha SureClick 810 MG/ML Soaj Generic drug: Evolocumab Inject 140 mg as directed every 14 (fourteen) days.   sertraline 100 MG tablet Commonly known as: ZOLOFT 1 daily What changed:  how much to take how to take this when to take this additional instructions   Theratrum Complete Tabs Take 1 tablet by mouth daily.        Major procedures and Radiology Reports - PLEASE review detailed and final reports for all details, in brief -   DG Chest Port 1 View  Result Date: 06/23/2022 CLINICAL DATA:  Initial evaluation for acute chest pain. EXAM: PORTABLE CHEST 1 VIEW COMPARISON:  Prior radiograph from 05/03/2022. FINDINGS: Dual lumen right IJ approach central venous catheter in place with tip overlying the right atrium. Median sternotomy wires underlying CABG markers noted. Cardiomegaly, stable. Mediastinal silhouette within normal limits. Lungs normally inflated. Mild diffuse pulmonary vascular congestion without overt pulmonary edema. Moderate right pleural effusion. Associated right basilar opacity favored to reflect atelectasis/effusion. Infiltrate not excluded. No pneumothorax. Osseous structures demonstrate no acute finding. IMPRESSION: 1. Moderate right pleural effusion. Associated right basilar opacity favored to reflect atelectasis/effusion, although infiltrate could be considered in  the correct clinical setting. 2. Cardiomegaly with mild diffuse pulmonary vascular congestion without overt pulmonary edema. Electronically Signed   By: Jeannine Boga M.D.   On: 06/23/2022 04:05    Today   Subjective    Newt Minion today has no new complaints No further chest pains -No dizziness no palpitations        -Discharge instructions and plan of care discussed with patient and patient's male friend/caregiver Ms [name] at bedside  Patient has been seen and examined prior to discharge   Objective   Blood pressure (!) 192/104, pulse 91, temperature 98.2 F (36.8 C), temperature source Oral, resp. rate 18, height '5\' 10"'$  (1.778 m), weight 79 kg, SpO2 97 %.  No intake or output data in the 24 hours ending 06/23/22 1307  Exam Gen:- Awake Alert, no acute distress  HEENT:- [place].AT, No sclera icterus Neck-Supple Neck, right IJ hemodialysis catheter Lungs-  CTAB , good air movement bilaterally CV- S1, S2 normal, regular, prior CABG scar Abd-  +ve B.Sounds, Abd Soft, No tenderness,    Extremity/Skin:-   good pulses Psych-affect is appropriate, oriented x3 Neuro-no new focal deficits, no tremors  MSK-Lt forearm AV fistula thrill and bruit   Data Review   CBC w Diff:  Lab Results  Component Value Date   WBC 10.2 06/23/2022   HGB 11.7 (L) 06/23/2022   HGB 9.0 (L) 11/01/2021   HCT 39.6 06/23/2022   HCT 28.1 (L) 11/01/2021   PLT 294 06/23/2022   PLT 254 11/01/2021   LYMPHOPCT 17 06/23/2022   MONOPCT 11 06/23/2022   EOSPCT 2 06/23/2022   BASOPCT 1 06/23/2022    CMP:  Lab Results  Component Value Date   NA 136 06/23/2022   NA 138 10/07/2021   K 4.1 06/23/2022  CL 96 (L) 06/23/2022   CO2 28 06/23/2022   BUN 28 (H) 06/23/2022   BUN 42 (H) 10/07/2021   CREATININE 3.45 (H) 06/23/2022   CREATININE 1.94 (H) 05/13/2019   PROT 6.8 06/23/2022   PROT 5.8 (L) 10/07/2021   ALBUMIN 3.4 (L) 06/23/2022   ALBUMIN 3.7 (L) 10/07/2021   BILITOT 0.7 06/23/2022    BILITOT 0.2 10/07/2021   ALKPHOS 129 (H) 06/23/2022   AST 27 06/23/2022   ALT 17 06/23/2022  .  Total Discharge time is about 33 minutes  Roxan Hockey M.D on 06/23/2022 at 1:07 PM  Go to [email or web address] -  for contact info  [facility] - Office  [phone]

## 2022-06-24 DIAGNOSIS — R6889 Other general symptoms and signs: Secondary | ICD-10-CM | POA: Diagnosis not present

## 2022-06-24 DIAGNOSIS — N2581 Secondary hyperparathyroidism of renal origin: Secondary | ICD-10-CM | POA: Diagnosis not present

## 2022-06-24 DIAGNOSIS — N186 End stage renal disease: Secondary | ICD-10-CM | POA: Diagnosis not present

## 2022-06-24 DIAGNOSIS — Z992 Dependence on renal dialysis: Secondary | ICD-10-CM | POA: Diagnosis not present

## 2022-06-24 LAB — HEPATITIS B SURFACE ANTIBODY, QUANTITATIVE: Hep B S AB Quant (Post): 81.2 m[IU]/mL (ref >9.9)

## 2022-06-27 ENCOUNTER — Encounter: Payer: Self-pay | Admitting: *Deleted

## 2022-06-27 ENCOUNTER — Ambulatory Visit: Payer: Self-pay | Admitting: *Deleted

## 2022-06-27 DIAGNOSIS — Z992 Dependence on renal dialysis: Secondary | ICD-10-CM | POA: Diagnosis not present

## 2022-06-27 DIAGNOSIS — N186 End stage renal disease: Secondary | ICD-10-CM | POA: Diagnosis not present

## 2022-06-27 DIAGNOSIS — N2581 Secondary hyperparathyroidism of renal origin: Secondary | ICD-10-CM | POA: Diagnosis not present

## 2022-06-27 NOTE — Patient Instructions (Signed)
Visit Information  Thank you for taking time to visit with me today. Please don't hesitate to contact me if I can be of assistance to you.   Following are the goals we discussed today:   Goals Addressed               This Visit's Progress     Receive Assistance Applying for [place]. (pt-stated)   On track     Care Coordination Interventions:  Problem solving interventions implemented. Task-centered strategies employed. Solution-focused activities revised. Active listening & reflection utilized.  Verbalization of feelings encouraged. Feelings of frustration & loss of independence validated. Emotional support provided. Continue to receive home health nursing & physical therapy services, through [facility] [phone]). Continue to receive home oxygen (portable tank, concentrator & supplies), through [employer] [phone]).   CSW collaboration with representative from [place] [phone]), to inquire about order for Power Lift Chair, faxed by Primary Care Provider, Dr. [name] with [place] [phone]# [phone]). ~ Representative with [employer] [phone]) confirmed receipt of order for Power Lift Chair from Primary Care Provider, Dr. [name] with [place] [phone]), but reported out-of-pocket expense of $3,000.00, which patient is unable to afford. CSW collaboration with Primary Care Provider, Dr. [name] with [place] [phone]), to inquire about referral for Palliative Care Consult for End-Stage Renal Disease & Congestive Heart Failure. ~ HIPAA compliant message left with receptionist at Primary Care Provider, Dr. [name] with [place] [phone]# [phone]), to inquire about referral for Palliative Care Consult. CSW collaboration with Primary Care Provider, Dr. [name] with [place] [phone]), to  request completion & signature of [place] application & submission to [facility] [phone]), for processing.   ~ Secure chat message via Epic sent to Primary Care Provider, Dr. [name] with [place] [phone]), to request completion & signature of [place] application & submission to [facility] [phone]), for processing. ~ HIPAA compliant message left with receptionist at Primary Care Provider, Dr. [name] with [place] [phone]), to request completion & signature of [place] application & submission to [facility] [phone]), for processing.   Please keep [place] Provider List, until approved for [place], through [facility] [phone]). Continue to attend dialysis treatments on Monday's, Wednesday's & Friday's, receiving transportation assistance through [place] [phone]). Please consider using one or more of the following transportation services, all free of charge to [place] Adult Medicaid recipients:   ~ [place] [phone]) ~ "[employer], music [phone]) ~ Medicaid Transportation (# [phone]) ~ [employer] (# [phone]) ~ [place] (# [phone])        Our next appointment is by telephone on 07/08/2022 at 9:45 am.  Please call the care guide team at [phone] if you need to cancel or reschedule your appointment.   If you are experiencing a Mental Health or [place] or need someone to talk to, please call the Suicide and Crisis Lifeline: 988 call the [place] National Suicide Prevention Lifeline: (218) 864-5831 or TTY: (574)078-4727 TTY 613-017-0474) to talk to a trained counselor call  1-800-273-TALK (toll free, 24 hour hotline) go to [facility] [address] [place] [phone]) call the [place]:  [phone] call 911  Patient verbalizes understanding of instructions and care plan provided today and agrees to view in [place]. Active MyChart status and patient understanding of how to access instructions and care plan via MyChart confirmed with patient.     Telephone follow up appointment with care management team member scheduled for:  07/08/2022 at 9:45 am.  [name], BSW, MSW, [place]  Licensed Clinical Social Worker  [place]  Mailing [place]. [address], [place], [place] [address] Physical Address-[address], [place], [place] [address] Toll Free Main # [phone] Fax # [phone] Cell # [phone] [email or web address]

## 2022-06-27 NOTE — Patient Outreach (Signed)
Care Coordination   Follow Up Visit Note   06/27/2022  Name: [name] MRN: [id number] DOB: October 03, 1955  [name] is a 67 y.o. year old male who sees [name], [name], MD for primary care. I spoke with [name] by phone today.  What matters to the patients health and wellness today?  Receive Assistance Applying for [place].   Goals Addressed               This Visit's Progress     Receive Assistance Applying for [place]. (pt-stated)   On track     Care Coordination Interventions:  Problem solving interventions implemented. Task-centered strategies employed. Solution-focused activities revised. Active listening & reflection utilized.  Verbalization of feelings encouraged. Feelings of frustration & loss of independence validated. Emotional support provided. Continue to receive home health nursing & physical therapy services, through [facility] [phone]). Continue to receive home oxygen (portable tank, concentrator & supplies), through [employer] [phone]).   CSW collaboration with representative from [place] [phone]), to inquire about order for Power Lift Chair, faxed by Primary Care Provider, Dr. Sallee [name] with [place] [phone]# [phone]). ~ Representative with [employer] [phone]) confirmed receipt of order for Power Lift Chair from Primary Care Provider, Dr. Sallee [name] with [place] [phone]), but reported out-of-pocket expense of $3,000.00, which patient is unable to afford. CSW collaboration with Primary Care Provider, Dr. Sallee [name] with [facility] [phone]), to inquire about referral for Palliative Care Consult for End-Stage Renal Disease & Congestive Heart Failure. ~ HIPAA compliant message left with receptionist at Primary Care Provider, Dr. Sallee [name] with [place] [phone]#  [phone]), to inquire about referral for Palliative Care Consult. CSW collaboration with Primary Care Provider, Dr. Sallee [name] with [place] [phone]), to request completion & signature of [place] application & submission to [facility] [phone]), for processing.   ~ Secure chat message via Epic sent to Primary Care Provider, Dr. Sallee [name] with [place] [phone]), to request completion & signature of [place] application & submission to [facility] [phone]), for processing. ~ HIPAA compliant message left with receptionist at Primary Care Provider, Dr. Sallee [name] with [place] [phone]), to request completion & signature of [place] application & submission to [facility] [phone]), for processing.   Please keep [place] Provider List, until approved for [place], through [facility] [phone]). Continue to attend dialysis treatments on Monday's, Wednesday's & Friday's, receiving transportation assistance through [place] [phone]). Please consider using one or more of the following transportation services, all free of charge to [place] Adult Medicaid recipients:   ~ [place] [phone]) ~ "[employer], music [phone]) ~ Medicaid Transportation (# [phone]) ~ [place] (# [phone]) ~ [place] (# [phone])        [name] assessments and interventions completed:  Yes.  Care Coordination Interventions:  Yes, provided.   Follow up plan: Follow up call scheduled for 07/08/2022 at 9:45 am.   Encounter Outcome:  Pt. Visit Completed.   [name], BSW, MSW, LCSW  Licensed [facility]  Mailing [place] [address] [place], [place] [address] Physical Address-[address]  [place], [place] [address] Toll Free Main # [phone] Fax # [phone] Cell # [phone] [email or web address]

## 2022-06-29 ENCOUNTER — Telehealth: Payer: Self-pay

## 2022-06-29 ENCOUNTER — Other Ambulatory Visit (HOSPITAL_COMMUNITY): Payer: Self-pay

## 2022-06-29 DIAGNOSIS — Z992 Dependence on renal dialysis: Secondary | ICD-10-CM | POA: Diagnosis not present

## 2022-06-29 DIAGNOSIS — R6889 Other general symptoms and signs: Secondary | ICD-10-CM | POA: Diagnosis not present

## 2022-06-29 DIAGNOSIS — I739 Peripheral vascular disease, unspecified: Secondary | ICD-10-CM

## 2022-06-29 DIAGNOSIS — N186 End stage renal disease: Secondary | ICD-10-CM | POA: Diagnosis not present

## 2022-06-29 DIAGNOSIS — N2581 Secondary hyperparathyroidism of renal origin: Secondary | ICD-10-CM | POA: Diagnosis not present

## 2022-06-29 DIAGNOSIS — I25708 Atherosclerosis of coronary artery bypass graft(s), unspecified, with other forms of angina pectoris: Secondary | ICD-10-CM

## 2022-06-29 DIAGNOSIS — E782 Mixed hyperlipidemia: Secondary | ICD-10-CM

## 2022-06-29 MED ORDER — REPATHA SURECLICK 140 MG/ML ~~LOC~~ SOAJ: 1.0000 mL | SUBCUTANEOUS | 1 refills | Status: DC

## 2022-06-29 NOTE — Telephone Encounter (Signed)
Pharmacy Patient Advocate Encounter  Prior Authorization for Repatha '140mg'$ /ml has been approved.    key# [id number] Effective dates: 1.1.24 through 12.31.24  Please note that rx Praluent is no longer covered on the patients formulary.

## 2022-07-01 DIAGNOSIS — N186 End stage renal disease: Secondary | ICD-10-CM | POA: Diagnosis not present

## 2022-07-01 DIAGNOSIS — N2581 Secondary hyperparathyroidism of renal origin: Secondary | ICD-10-CM | POA: Diagnosis not present

## 2022-07-01 DIAGNOSIS — Z992 Dependence on renal dialysis: Secondary | ICD-10-CM | POA: Diagnosis not present

## 2022-07-01 DIAGNOSIS — R6889 Other general symptoms and signs: Secondary | ICD-10-CM | POA: Diagnosis not present

## 2022-07-04 DIAGNOSIS — R6889 Other general symptoms and signs: Secondary | ICD-10-CM | POA: Diagnosis not present

## 2022-07-04 DIAGNOSIS — Z992 Dependence on renal dialysis: Secondary | ICD-10-CM | POA: Diagnosis not present

## 2022-07-04 DIAGNOSIS — N2581 Secondary hyperparathyroidism of renal origin: Secondary | ICD-10-CM | POA: Diagnosis not present

## 2022-07-04 DIAGNOSIS — N186 End stage renal disease: Secondary | ICD-10-CM | POA: Diagnosis not present

## 2022-07-06 ENCOUNTER — Telehealth (HOSPITAL_BASED_OUTPATIENT_CLINIC_OR_DEPARTMENT_OTHER): Payer: Self-pay | Admitting: Cardiovascular Disease

## 2022-07-06 DIAGNOSIS — N186 End stage renal disease: Secondary | ICD-10-CM | POA: Diagnosis not present

## 2022-07-06 DIAGNOSIS — R6889 Other general symptoms and signs: Secondary | ICD-10-CM | POA: Diagnosis not present

## 2022-07-06 DIAGNOSIS — Z992 Dependence on renal dialysis: Secondary | ICD-10-CM | POA: Diagnosis not present

## 2022-07-06 DIAGNOSIS — N2581 Secondary hyperparathyroidism of renal origin: Secondary | ICD-10-CM | POA: Diagnosis not present

## 2022-07-06 DIAGNOSIS — I471 Supraventricular tachycardia, unspecified: Secondary | ICD-10-CM

## 2022-07-06 NOTE — Telephone Encounter (Signed)
Pt c/o medication issue:  1. Name of Medication: metoprolol tartrate 75 MG TABS   2. How are you currently taking this medication (dosage and times per day)? Take 75 mg by mouth 2 (two) times daily.   3. Are you having a reaction (difficulty breathing--STAT)? no  4. What is your medication issue? Wife calling in to say the patient has stop taking the medication. States its makes him hallucinate, he is take the metoprolol what he was on before instead. Please advise

## 2022-07-07 ENCOUNTER — Telehealth (HOSPITAL_BASED_OUTPATIENT_CLINIC_OR_DEPARTMENT_OTHER): Payer: Self-pay | Admitting: Cardiovascular Disease

## 2022-07-07 ENCOUNTER — Ambulatory Visit: Payer: Medicare HMO | Attending: Cardiovascular Disease

## 2022-07-07 DIAGNOSIS — E113593 Type 2 diabetes mellitus with proliferative diabetic retinopathy without macular edema, bilateral: Secondary | ICD-10-CM | POA: Diagnosis not present

## 2022-07-07 DIAGNOSIS — N19 Unspecified kidney failure: Secondary | ICD-10-CM | POA: Diagnosis not present

## 2022-07-07 DIAGNOSIS — Z992 Dependence on renal dialysis: Secondary | ICD-10-CM | POA: Diagnosis not present

## 2022-07-07 DIAGNOSIS — I471 Supraventricular tachycardia, unspecified: Secondary | ICD-10-CM

## 2022-07-07 DIAGNOSIS — Z515 Encounter for palliative care: Secondary | ICD-10-CM | POA: Diagnosis not present

## 2022-07-07 DIAGNOSIS — N186 End stage renal disease: Secondary | ICD-10-CM | POA: Diagnosis not present

## 2022-07-07 DIAGNOSIS — I25810 Atherosclerosis of coronary artery bypass graft(s) without angina pectoris: Secondary | ICD-10-CM | POA: Diagnosis not present

## 2022-07-07 DIAGNOSIS — I5042 Chronic combined systolic (congestive) and diastolic (congestive) heart failure: Secondary | ICD-10-CM | POA: Diagnosis not present

## 2022-07-07 DIAGNOSIS — E1165 Type 2 diabetes mellitus with hyperglycemia: Secondary | ICD-10-CM | POA: Diagnosis not present

## 2022-07-07 MED ORDER — METOPROLOL SUCCINATE ER 50 MG PO TB24: ORAL_TABLET | ORAL | 0 refills | Status: DC

## 2022-07-07 NOTE — Telephone Encounter (Signed)
Routed to MD for review, will call to update patient when we have recommendations!

## 2022-07-07 NOTE — Telephone Encounter (Signed)
Patient states she was returning a call. Please advise  

## 2022-07-07 NOTE — Addendum Note (Signed)
Addended by: Alvina Filbert B on: 07/07/2022 01:12 PM   Modules accepted: Orders, Level of Service

## 2022-07-07 NOTE — Telephone Encounter (Signed)
Admitted 06/23/22 for chest pain. EMS reported episodes of SVT. Pain was retrosternal and epigastric in setting of esophageal cancer. HD troponin 42 .asi 42. EG SVT 177 with QTc 527 ms. Cardiology consult by Dr. Dellia Cloud recommended stop metoprolol succinate and start metoprolol tartrate '75mg'$  BID.   Pre Dr. Londell Moh note, "Switch metoprolol succinate to tartrate 75 mg twice daily and continue amiodarone 200 mg once daily. 1 week event monitor upon discharge to quantify PVC burden and evaluation of SVT episodes."  If he wishes, may return to Metoprolol Succinate at dose of '75mg'$  QD. Please mail 7 day ZIO monitor. F/u with Dr. Oval Linsey or Loel Dubonnet, NP after monitor result (likely 3-4 week f/u).  Loel Dubonnet, NP

## 2022-07-07 NOTE — Progress Notes (Unsigned)
Enrolled for [employer] to mail a ZIO XT long term holter monitor to the patients address on file.  

## 2022-07-07 NOTE — Telephone Encounter (Signed)
This encounter was created in error - please disregard.

## 2022-07-07 NOTE — Telephone Encounter (Signed)
Advised [name], verbalized understanding. Ok per [facility]  Scheduled follow up and ordered zio

## 2022-07-08 ENCOUNTER — Ambulatory Visit: Payer: Self-pay | Admitting: *Deleted

## 2022-07-08 DIAGNOSIS — Z992 Dependence on renal dialysis: Secondary | ICD-10-CM | POA: Diagnosis not present

## 2022-07-08 DIAGNOSIS — R6889 Other general symptoms and signs: Secondary | ICD-10-CM | POA: Diagnosis not present

## 2022-07-08 DIAGNOSIS — N2581 Secondary hyperparathyroidism of renal origin: Secondary | ICD-10-CM | POA: Diagnosis not present

## 2022-07-08 DIAGNOSIS — N186 End stage renal disease: Secondary | ICD-10-CM | POA: Diagnosis not present

## 2022-07-08 NOTE — Patient Outreach (Signed)
  Care Coordination   07/08/2022  Name: [name] MRN: [id number] DOB: 1956/01/05   Care Coordination Outreach Attempts:  An unsuccessful telephone outreach was attempted today to offer the patient information about available care coordination services as a benefit of their health plan. HIPAA compliant message left on voicemail, providing contact information for CSW, encouraging patient to return CSW's call at his earliest covnenience.  Follow Up Plan:  Additional outreach attempts will be made to offer the patient care coordination information and services.   Encounter Outcome:  No Answer.   Care Coordination Interventions:  No, not indicated.    [name], BSW, MSW, LCSW  Licensed Education officer, environmental Health System  Mailing [place] [address] [place], [place] [address] Physical Address-[address], [place], [place] [address] Toll Free Main # [phone] Fax # [phone] Cell # [phone] [email or web address]

## 2022-07-11 DIAGNOSIS — N186 End stage renal disease: Secondary | ICD-10-CM | POA: Diagnosis not present

## 2022-07-11 DIAGNOSIS — Z992 Dependence on renal dialysis: Secondary | ICD-10-CM | POA: Diagnosis not present

## 2022-07-11 DIAGNOSIS — N2581 Secondary hyperparathyroidism of renal origin: Secondary | ICD-10-CM | POA: Diagnosis not present

## 2022-07-11 DIAGNOSIS — R6889 Other general symptoms and signs: Secondary | ICD-10-CM | POA: Diagnosis not present

## 2022-07-12 ENCOUNTER — Telehealth: Payer: Self-pay | Admitting: Family Medicine

## 2022-07-12 DIAGNOSIS — I25118 Atherosclerotic heart disease of native coronary artery with other forms of angina pectoris: Secondary | ICD-10-CM | POA: Diagnosis not present

## 2022-07-12 DIAGNOSIS — K219 Gastro-esophageal reflux disease without esophagitis: Secondary | ICD-10-CM | POA: Diagnosis not present

## 2022-07-12 DIAGNOSIS — E1165 Type 2 diabetes mellitus with hyperglycemia: Secondary | ICD-10-CM | POA: Diagnosis not present

## 2022-07-12 DIAGNOSIS — T24231D Burn of second degree of right lower leg, subsequent encounter: Secondary | ICD-10-CM | POA: Diagnosis not present

## 2022-07-12 DIAGNOSIS — N186 End stage renal disease: Secondary | ICD-10-CM | POA: Diagnosis not present

## 2022-07-12 DIAGNOSIS — I132 Hypertensive heart and chronic kidney disease with heart failure and with stage 5 chronic kidney disease, or end stage renal disease: Secondary | ICD-10-CM | POA: Diagnosis not present

## 2022-07-12 DIAGNOSIS — D631 Anemia in chronic kidney disease: Secondary | ICD-10-CM | POA: Diagnosis not present

## 2022-07-12 DIAGNOSIS — E1122 Type 2 diabetes mellitus with diabetic chronic kidney disease: Secondary | ICD-10-CM | POA: Diagnosis not present

## 2022-07-12 DIAGNOSIS — I5043 Acute on chronic combined systolic (congestive) and diastolic (congestive) heart failure: Secondary | ICD-10-CM | POA: Diagnosis not present

## 2022-07-12 NOTE — Telephone Encounter (Signed)
Cathy contacted at [phone] and gave verbal OK.

## 2022-07-12 NOTE — Telephone Encounter (Signed)
Cathy from [facility] calling to obtain orders for skilled nursing, occupational therapy, and physical therapy. Please advise. Thank you

## 2022-07-12 NOTE — Telephone Encounter (Signed)
May have verbal orders for this

## 2022-07-13 DIAGNOSIS — N186 End stage renal disease: Secondary | ICD-10-CM | POA: Diagnosis not present

## 2022-07-13 DIAGNOSIS — Z992 Dependence on renal dialysis: Secondary | ICD-10-CM | POA: Diagnosis not present

## 2022-07-13 DIAGNOSIS — N2581 Secondary hyperparathyroidism of renal origin: Secondary | ICD-10-CM | POA: Diagnosis not present

## 2022-07-13 DIAGNOSIS — E1122 Type 2 diabetes mellitus with diabetic chronic kidney disease: Secondary | ICD-10-CM | POA: Diagnosis not present

## 2022-07-14 ENCOUNTER — Other Ambulatory Visit: Payer: Self-pay | Admitting: Family Medicine

## 2022-07-15 DIAGNOSIS — N186 End stage renal disease: Secondary | ICD-10-CM | POA: Diagnosis not present

## 2022-07-15 DIAGNOSIS — R6889 Other general symptoms and signs: Secondary | ICD-10-CM | POA: Diagnosis not present

## 2022-07-15 DIAGNOSIS — Z992 Dependence on renal dialysis: Secondary | ICD-10-CM | POA: Diagnosis not present

## 2022-07-15 DIAGNOSIS — N2581 Secondary hyperparathyroidism of renal origin: Secondary | ICD-10-CM | POA: Diagnosis not present

## 2022-07-18 ENCOUNTER — Telehealth: Payer: Self-pay | Admitting: *Deleted

## 2022-07-18 ENCOUNTER — Other Ambulatory Visit: Payer: Self-pay | Admitting: Family Medicine

## 2022-07-18 DIAGNOSIS — R6889 Other general symptoms and signs: Secondary | ICD-10-CM | POA: Diagnosis not present

## 2022-07-18 DIAGNOSIS — N186 End stage renal disease: Secondary | ICD-10-CM | POA: Diagnosis not present

## 2022-07-18 DIAGNOSIS — N2581 Secondary hyperparathyroidism of renal origin: Secondary | ICD-10-CM | POA: Diagnosis not present

## 2022-07-18 DIAGNOSIS — Z992 Dependence on renal dialysis: Secondary | ICD-10-CM | POA: Diagnosis not present

## 2022-07-18 MED ORDER — TRAMADOL HCL 50 MG PO TABS: 50.0000 mg | ORAL_TABLET | Freq: Three times a day (TID) | ORAL | 0 refills | Status: AC | PRN

## 2022-07-18 NOTE — Telephone Encounter (Signed)
Prescription was sent in if he has ongoing troubles with his ribs or any other issues he may need to be worked in

## 2022-07-18 NOTE — Telephone Encounter (Signed)
Patient called requesting prescription of Tramadol sent to [facility]  Patient states he fell down steps and injured ribs

## 2022-07-18 NOTE — Telephone Encounter (Signed)
Left message to return call.  

## 2022-07-19 DIAGNOSIS — I5043 Acute on chronic combined systolic (congestive) and diastolic (congestive) heart failure: Secondary | ICD-10-CM | POA: Diagnosis not present

## 2022-07-19 DIAGNOSIS — D631 Anemia in chronic kidney disease: Secondary | ICD-10-CM | POA: Diagnosis not present

## 2022-07-19 DIAGNOSIS — N186 End stage renal disease: Secondary | ICD-10-CM | POA: Diagnosis not present

## 2022-07-19 DIAGNOSIS — E1165 Type 2 diabetes mellitus with hyperglycemia: Secondary | ICD-10-CM | POA: Diagnosis not present

## 2022-07-19 DIAGNOSIS — I25118 Atherosclerotic heart disease of native coronary artery with other forms of angina pectoris: Secondary | ICD-10-CM | POA: Diagnosis not present

## 2022-07-19 DIAGNOSIS — I132 Hypertensive heart and chronic kidney disease with heart failure and with stage 5 chronic kidney disease, or end stage renal disease: Secondary | ICD-10-CM | POA: Diagnosis not present

## 2022-07-19 DIAGNOSIS — E1122 Type 2 diabetes mellitus with diabetic chronic kidney disease: Secondary | ICD-10-CM | POA: Diagnosis not present

## 2022-07-19 DIAGNOSIS — T24231D Burn of second degree of right lower leg, subsequent encounter: Secondary | ICD-10-CM | POA: Diagnosis not present

## 2022-07-19 DIAGNOSIS — K219 Gastro-esophageal reflux disease without esophagitis: Secondary | ICD-10-CM | POA: Diagnosis not present

## 2022-07-19 NOTE — Telephone Encounter (Signed)
Pt left voicemail on phone returning call. Nurse returned call but had to leave message. Left detailed message (OK per DPR)

## 2022-07-20 ENCOUNTER — Telehealth: Payer: Self-pay | Admitting: Family Medicine

## 2022-07-20 ENCOUNTER — Telehealth: Payer: Self-pay | Admitting: *Deleted

## 2022-07-20 DIAGNOSIS — N186 End stage renal disease: Secondary | ICD-10-CM | POA: Diagnosis not present

## 2022-07-20 DIAGNOSIS — Z992 Dependence on renal dialysis: Secondary | ICD-10-CM | POA: Diagnosis not present

## 2022-07-20 DIAGNOSIS — N2581 Secondary hyperparathyroidism of renal origin: Secondary | ICD-10-CM | POA: Diagnosis not present

## 2022-07-20 DIAGNOSIS — R6889 Other general symptoms and signs: Secondary | ICD-10-CM | POA: Diagnosis not present

## 2022-07-20 NOTE — Telephone Encounter (Signed)
Verbal orders given to  Short from [facility]

## 2022-07-20 NOTE — Telephone Encounter (Signed)
may get physical therapy order thank you

## 2022-07-20 NOTE — Progress Notes (Signed)
  Care Coordination Note  07/20/2022 Name: KARTHIK WHITTINGHILL MRN: [id number] DOB: 17-Jun-1955  CAMERAN PETTEY is a 67 y.o. year old male who is a primary care patient of [name], [name], MD and is actively engaged with the care management team. I reached out to [name] by phone today to assist with re-scheduling a follow up visit with the RN Case Manager  Follow up plan: Unsuccessful telephone outreach attempt made. A HIPAA compliant phone message was left for the patient providing contact information and requesting a return call.   SIGNATURE

## 2022-07-20 NOTE — Telephone Encounter (Signed)
[name] from [facility] calling to receive verbal orders for Physical Therapy. One time a week for 8 weeks; will work on balance and gait. Please advise. Thank you  OK to leave detailed message [phone]

## 2022-07-21 ENCOUNTER — Telehealth: Payer: Self-pay | Admitting: Physician Assistant

## 2022-07-21 ENCOUNTER — Telehealth: Payer: Self-pay | Admitting: Family Medicine

## 2022-07-21 NOTE — Telephone Encounter (Signed)
[name] called into office and gave Engineer, water OK for [name] to speak with provider and be involved in his care.   Please advise. Thank you.
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
## 2022-07-21 NOTE — Telephone Encounter (Signed)
Pt daughter Amy called in to speak with Dr.Scott. Amy is not on DPR at this time. Informed Amy that she would need to have [place] call and give [place] the verbal OK to speak to Amy about his care.   Dr.Amy Godina (ER Dr) would like to speak with Dr.Scott for an update and services that our office can provide. Amy states she has multiple places her such as [facility] for help with father since he his home alone.  Amy Cell [phone]

## 2022-07-22 DIAGNOSIS — N186 End stage renal disease: Secondary | ICD-10-CM | POA: Diagnosis not present

## 2022-07-22 DIAGNOSIS — Z992 Dependence on renal dialysis: Secondary | ICD-10-CM | POA: Diagnosis not present

## 2022-07-22 DIAGNOSIS — N2581 Secondary hyperparathyroidism of renal origin: Secondary | ICD-10-CM | POA: Diagnosis not present

## 2022-07-25 DIAGNOSIS — Z992 Dependence on renal dialysis: Secondary | ICD-10-CM | POA: Diagnosis not present

## 2022-07-25 DIAGNOSIS — N186 End stage renal disease: Secondary | ICD-10-CM | POA: Diagnosis not present

## 2022-07-25 DIAGNOSIS — N2581 Secondary hyperparathyroidism of renal origin: Secondary | ICD-10-CM | POA: Diagnosis not present

## 2022-07-25 NOTE — Telephone Encounter (Signed)
Pt daughter Amy contacted and states that end of day on Wednesday 07/27/21 would work for her.  Contacted Desiree Hane regarding Home health.

## 2022-07-25 NOTE — Telephone Encounter (Signed)
Please touch base with family member 2 things #1 explained to her that I will be happy to talk with her would like to typically make my phone calls during the lunch hour or right at the end of the day please see what works best for patient (within reason) When I call we can discuss concerns that she has Please let me know what works best thanks  #2-please also explain to her that we have the ability to consult medical social worker to try to help her dad.  Try to find out some of the needs that she feels he needs currently so we can get the ball rolling on this. ( Also we have tried previously to get home health initiated but had a hard time finding a local home health taking on new patients and once working with [facility] but we can certainly try again)

## 2022-07-26 ENCOUNTER — Telehealth: Payer: Self-pay | Admitting: Family Medicine

## 2022-07-26 DIAGNOSIS — E1122 Type 2 diabetes mellitus with diabetic chronic kidney disease: Secondary | ICD-10-CM | POA: Diagnosis not present

## 2022-07-26 DIAGNOSIS — I132 Hypertensive heart and chronic kidney disease with heart failure and with stage 5 chronic kidney disease, or end stage renal disease: Secondary | ICD-10-CM | POA: Diagnosis not present

## 2022-07-26 DIAGNOSIS — Z992 Dependence on renal dialysis: Secondary | ICD-10-CM | POA: Diagnosis not present

## 2022-07-26 DIAGNOSIS — I5043 Acute on chronic combined systolic (congestive) and diastolic (congestive) heart failure: Secondary | ICD-10-CM | POA: Diagnosis not present

## 2022-07-26 DIAGNOSIS — N186 End stage renal disease: Secondary | ICD-10-CM | POA: Diagnosis not present

## 2022-07-26 DIAGNOSIS — I25118 Atherosclerotic heart disease of native coronary artery with other forms of angina pectoris: Secondary | ICD-10-CM | POA: Diagnosis not present

## 2022-07-26 DIAGNOSIS — D631 Anemia in chronic kidney disease: Secondary | ICD-10-CM | POA: Diagnosis not present

## 2022-07-26 DIAGNOSIS — E1165 Type 2 diabetes mellitus with hyperglycemia: Secondary | ICD-10-CM | POA: Diagnosis not present

## 2022-07-26 DIAGNOSIS — T24231D Burn of second degree of right lower leg, subsequent encounter: Secondary | ICD-10-CM | POA: Diagnosis not present

## 2022-07-26 DIAGNOSIS — Z452 Encounter for adjustment and management of vascular access device: Secondary | ICD-10-CM | POA: Diagnosis not present

## 2022-07-26 DIAGNOSIS — K219 Gastro-esophageal reflux disease without esophagitis: Secondary | ICD-10-CM | POA: Diagnosis not present

## 2022-07-26 NOTE — Telephone Encounter (Signed)
May have verbal orders

## 2022-07-26 NOTE — Telephone Encounter (Signed)
[name] with Home Health calling to request verbal orders for Home Health OT one week 4. Please advise. Thank you.   [phone]

## 2022-07-26 NOTE — Progress Notes (Signed)
  Care Coordination Note  07/26/2022 Name: [name] MRN: [id number] DOB: 1955-11-13  [name] is a 67 y.o. year old male who is a primary care patient of [name], [name], MD and is actively engaged with the care management team. I reached out to [name] by phone today to assist with re-scheduling a follow up visit with the Licensed Clinical Social Worker  Follow up plan: Telephone appointment with care management team member scheduled for:07/27/22  [place]  Direct Dial: [phone]

## 2022-07-27 ENCOUNTER — Telehealth: Payer: Self-pay

## 2022-07-27 ENCOUNTER — Encounter: Payer: Self-pay | Admitting: *Deleted

## 2022-07-27 ENCOUNTER — Ambulatory Visit: Payer: Self-pay | Admitting: *Deleted

## 2022-07-27 DIAGNOSIS — N186 End stage renal disease: Secondary | ICD-10-CM | POA: Diagnosis not present

## 2022-07-27 DIAGNOSIS — Z992 Dependence on renal dialysis: Secondary | ICD-10-CM | POA: Diagnosis not present

## 2022-07-27 DIAGNOSIS — N2581 Secondary hyperparathyroidism of renal origin: Secondary | ICD-10-CM | POA: Diagnosis not present

## 2022-07-27 NOTE — Telephone Encounter (Signed)
Left message to return call with Mallory ([facility])

## 2022-07-27 NOTE — Telephone Encounter (Signed)
Please go ahead and order this thank you

## 2022-07-27 NOTE — Telephone Encounter (Signed)
Hi [name]-Dr. [name]  I did discuss the case with his sister who has concerns about the patient's ability to take care of himself No doubt about it he would benefit from having a personal assessment We are in the process of reaching out to the social worker to get any additional input from them as well  (A staff message was sent to [employer] regarding this patient)

## 2022-07-27 NOTE — Telephone Encounter (Signed)
Orders pending sig.

## 2022-07-27 NOTE — Patient Outreach (Signed)
Care Coordination   Follow Up Visit Note   07/27/2022  Name: [name] MRN: [id number] DOB: 02-12-1956  [name] is a 67 y.o. year old male who sees [name], [name], MD for primary care. I spoke with [name] by phone today.  What matters to the patients health and wellness today?  Receive Assistance Applying for [place].    Goals Addressed               This Visit's Progress     Receive Assistance Applying for [place]. (pt-stated)   On track     Care Coordination Interventions:  Interventions Today    Flowsheet Row Most Recent Value  Chronic Disease   Chronic disease during today's visit Diabetes, Hypertension (HTN), Congestive Heart Failure (CHF), Chronic Kidney Disease/End Stage Renal Disease (ESRD), Other  [Major Depression, Single Episde, Severe]  General Interventions   General Interventions Discussed/Reviewed General Interventions Discussed, General Interventions Reviewed, Annual Eye Exam, Labs, Annual Foot Exam, Lipid Profile, Durable Medical Equipment (DME), Vaccines, Health Screening, [employer], Doctor Visits, Communication with, Level of Care  [facility] Care Provider & Representative with [facility]]  Labs Hgb A1c every 3 months, Kidney Function  Vaccines COVID-19, Flu, Pneumonia, RSV, Shingles  [Encouraged]  Doctor Visits Discussed/Reviewed Doctor Visits Discussed, Doctor Visits Reviewed, Annual Wellness Visits, PCP, Specialist  Health Screening Colonoscopy, Prostate  Durable Medical Equipment (DME) BP Cuff, Glucomoter, Insulin Pump, Oxygen, Walker, Community education officer  PCP/Specialist Visits Compliance with follow-up visit  Communication with PCP/Specialists  Level of Care Applications, Assisted Living, [place]  Exercise Interventions   Exercise Discussed/Reviewed Exercise Discussed, Exercise Reviewed,  Physical Activity, Weight Managment, Assistive device use and maintanence  Physical Activity Discussed/Reviewed Physical Activity Discussed, Physical Activity Reviewed, Types of exercise  [Encouraged]  Weight Management Weight maintenance  Education Interventions   Education Provided Provided Engineer, site, Provided Education  Provided Verbal Education On Nutrition, Foot Care, Eye Care, Labs, Blood Sugar Monitoring, Mental Health/Coping with Illness, Applications, Exercise, Medication, When to see the doctor, [place] Discussed, Mental Health Reviewed, Coping Strategies, Crisis, Anxiety, Depression, Suicide  Nutrition Interventions   Nutrition Discussed/Reviewed Nutrition Discussed, Nutrition Reviewed, Adding fruits and vegetables, Fluid intake, Portion sizes, Decreasing sugar intake, Decreasing salt, Decreasing fats, Increaing proteins  Pharmacy Interventions   Pharmacy Dicussed/Reviewed Pharmacy Topics Discussed, Pharmacy Topics Reviewed, Medication Adherence, Affording Medications  Safety Interventions   Safety Discussed/Reviewed Safety Discussed, Safety Reviewed, Fall Risk, Home Safety  Home Herbalist, Refer for community resources, Contact home health agency  Advanced Directive Interventions   Advanced Directives Discussed/Reviewed Advanced Directives Discussed, Advanced Directives Reviewed     Active listening & reflection utilized.  Verbalization of feelings encouraged. Emotional support provided. Feelings of frustration & loss of independence validated. Caregiver stress/burnout acknowledged. Caregiver resources reviewed. Self-enrollment in caregiver support group of interest emphasized, from list provided. Problem solving interventions implemented. Task-centered strategies employed. Solution-focused activities  enacted. Brief Cognitive Behavioral Therapy initiated. Acceptance & Commitment Therapy introduced. Client-Centered Therapy performed. Crisis resources, information, agencies & services discussed. Referral to psychiatrist for psychotropic medication management encouraged. Referral to therapist for psychotherapeutic counseling services encouraged. Reviewed prescription medications & discussed importance of compliance. CSW collaboration with Primary Care Provider, Dr. [name] with [facility] [phone]), via [place] in [place],  to request completion & signature of Personal Care Services application & submission to [facility] [phone]), for processing, on 06/27/2022 & on 07/27/2022.   CSW collaboration with receptionist at [place] [phone]), to confirm receipt of [place] application, request completion & signature by Primary Care Provider, Dr. [name] & encourage fax submission to [facility] [phone]), for processing, on 06/27/2022 & on 07/27/2022. Please keep [place] Provider List, until approved for [place], through [facility] [phone]). Please consider using one or more of the following transportation services, all free of charge to [place] Adult Medicaid recipients:   ~ [place] [phone]) ~ "ATeacher, music [phone]) ~ Medicaid Transportation (# [phone]) ~ [place] (# [id number]) ~ [place] (# [phone])        [name] assessments and interventions completed:  Yes.  Care Coordination Interventions:  Yes, provided.   Follow up plan: Follow up call scheduled for 08/10/2022 at 1:45 pm.  Encounter Outcome:  Pt. Visit Completed.   [name], BSW, MSW, LCSW  Licensed  Education officer, environmental Health System  Mailing [place] [address], [place], [place] [address] Physical Address-[address], [place], [place] [address] Toll Free Main # [phone] Fax # [phone] Cell # [phone] [email or web address]

## 2022-07-27 NOTE — Patient Instructions (Signed)
Visit Information  Thank you for taking time to visit with me today. Please don't hesitate to contact me if I can be of assistance to you.   Following are the goals we discussed today:   Goals Addressed               This Visit's Progress     Receive Assistance Applying for [place]. (pt-stated)   On track     Care Coordination Interventions:  Interventions Today    Flowsheet Row Most Recent Value  Chronic Disease   Chronic disease during today's visit Diabetes, Hypertension (HTN), Congestive Heart Failure (CHF), Chronic Kidney Disease/End Stage Renal Disease (ESRD), Other  [Major Depression, Single Episde, Severe]  General Interventions   General Interventions Discussed/Reviewed General Interventions Discussed, General Interventions Reviewed, Annual Eye Exam, Labs, Annual Foot Exam, Lipid Profile, Durable Medical Equipment (DME), Vaccines, Health Screening, [employer], Doctor Visits, Communication with, Level of Care  [facility] Care Provider & Representative with [facility]]  Labs Hgb A1c every 3 months, Kidney Function  Vaccines COVID-19, Flu, Pneumonia, RSV, Shingles  [Encouraged]  Doctor Visits Discussed/Reviewed Doctor Visits Discussed, Doctor Visits Reviewed, Annual Wellness Visits, PCP, Specialist  Health Screening Colonoscopy, Prostate  Durable Medical Equipment (DME) BP Cuff, Glucomoter, Insulin Pump, Oxygen, Walker, Community education officer  PCP/Specialist Visits Compliance with follow-up visit  Communication with PCP/Specialists  Level of Care Applications, Assisted Living, [name]  Exercise Interventions   Exercise Discussed/Reviewed Exercise Discussed, Exercise Reviewed, Physical Activity, Weight Managment, Assistive device use and maintanence  Physical Activity Discussed/Reviewed Physical Activity Discussed, Physical Activity Reviewed, Types of exercise   [Encouraged]  Weight Management Weight maintenance  Education Interventions   Education Provided Provided Engineer, site, Provided Education  Provided Verbal Education On Nutrition, Foot Care, Eye Care, Labs, Blood Sugar Monitoring, Mental Health/Coping with Illness, Applications, Exercise, Medication, When to see the doctor, [place] Discussed, Mental Health Reviewed, Coping Strategies, Crisis, Anxiety, Depression, Suicide  Nutrition Interventions   Nutrition Discussed/Reviewed Nutrition Discussed, Nutrition Reviewed, Adding fruits and vegetables, Fluid intake, Portion sizes, Decreasing sugar intake, Decreasing salt, Decreasing fats, Increaing proteins  Pharmacy Interventions   Pharmacy Dicussed/Reviewed Pharmacy Topics Discussed, Pharmacy Topics Reviewed, Medication Adherence, Affording Medications  Safety Interventions   Safety Discussed/Reviewed Safety Discussed, Safety Reviewed, Fall Risk, Home Safety  Home Herbalist, Refer for community resources, Contact home health agency  Advanced Directive Interventions   Advanced Directives Discussed/Reviewed Advanced Directives Discussed, Advanced Directives Reviewed     Active listening & reflection utilized.  Verbalization of feelings encouraged. Emotional support provided. Feelings of frustration & loss of independence validated. Caregiver stress/burnout acknowledged. Caregiver resources reviewed. Self-enrollment in caregiver support group of interest emphasized, from list provided. Problem solving interventions implemented. Task-centered strategies employed. Solution-focused activities enacted. Brief Cognitive Behavioral Therapy initiated. Acceptance & Commitment Therapy introduced. Client-Centered Therapy performed. Crisis resources, information, agencies & services  discussed. Referral to psychiatrist for psychotropic medication management encouraged. Referral to therapist for psychotherapeutic counseling services encouraged. Reviewed prescription medications & discussed importance of compliance. CSW collaboration with Primary Care Provider, Dr. [name] with [facility] [phone]# [phone]), via [place] in [place], to request completion & signature of [place] application & submission to [facility] [phone]), for processing, on 06/27/2022 & on 07/27/2022.   CSW collaboration with receptionist at [employer]  [facility] (# [phone]), to confirm receipt of [place] application, request completion & signature by Primary Care Provider, Dr. [name] & encourage fax submission to [facility] [phone]), for processing, on 06/27/2022 & on 07/27/2022. Please keep [place] Provider List, until approved for [place], through [facility] [phone]). Please consider using one or more of the following transportation services, all free of charge to [place] Adult Medicaid recipients:   ~ [place] [phone]) ~ "[employer], music [phone]) ~ Medicaid Transportation (# [phone]) ~ [employer] (# [id number]) ~ [place] (# [phone])        Our next appointment is by telephone on 08/10/2022 at 1:45 pm.  Please call the care guide team at [phone] if you need to cancel or reschedule your appointment.   If you are experiencing a Mental Health or [place] or need someone to talk to, please call the Suicide and Crisis Lifeline: 988 call the [place] National Suicide Prevention Lifeline: 904 441 2347 or TTY: 930-637-5217 TTY 629-361-7900) to talk to a trained counselor call  1-800-273-TALK (toll free, 24 hour hotline) go to [facility] [address], [place] [phone]) call the [place]: [phone] call 911  Patient verbalizes understanding of instructions and care plan provided today and agrees to view in [place]. Active MyChart status and patient understanding of how to access instructions and care plan via MyChart confirmed with patient.     Telephone follow up appointment with care management team member scheduled for:  08/10/2022 at 1:45 pm.  [name], BSW, MSW, [place]  Licensed Clinical Social Worker  [place]  [place]. [address] [place], [place] [address] Physical Address-[address] [place], [place] [address] Toll Free Main # [phone] Fax # [phone] Cell # [phone] [email or web address]

## 2022-07-27 NOTE — Telephone Encounter (Signed)
Orders for OT with [place] given [phone] also states patient  bp today 158/70 no symptoms . States patient would benefit from a bedside commode may order via [employer], please advise

## 2022-07-27 NOTE — Telephone Encounter (Signed)
[name] returned call and orders for OT were given. See other telephone message

## 2022-07-28 ENCOUNTER — Telehealth: Payer: Self-pay | Admitting: *Deleted

## 2022-07-28 DIAGNOSIS — I5043 Acute on chronic combined systolic (congestive) and diastolic (congestive) heart failure: Secondary | ICD-10-CM | POA: Diagnosis not present

## 2022-07-28 DIAGNOSIS — E1122 Type 2 diabetes mellitus with diabetic chronic kidney disease: Secondary | ICD-10-CM | POA: Diagnosis not present

## 2022-07-28 DIAGNOSIS — I25118 Atherosclerotic heart disease of native coronary artery with other forms of angina pectoris: Secondary | ICD-10-CM | POA: Diagnosis not present

## 2022-07-28 DIAGNOSIS — N186 End stage renal disease: Secondary | ICD-10-CM | POA: Diagnosis not present

## 2022-07-28 DIAGNOSIS — I132 Hypertensive heart and chronic kidney disease with heart failure and with stage 5 chronic kidney disease, or end stage renal disease: Secondary | ICD-10-CM | POA: Diagnosis not present

## 2022-07-28 DIAGNOSIS — E1165 Type 2 diabetes mellitus with hyperglycemia: Secondary | ICD-10-CM | POA: Diagnosis not present

## 2022-07-28 DIAGNOSIS — K219 Gastro-esophageal reflux disease without esophagitis: Secondary | ICD-10-CM | POA: Diagnosis not present

## 2022-07-28 DIAGNOSIS — T24231D Burn of second degree of right lower leg, subsequent encounter: Secondary | ICD-10-CM | POA: Diagnosis not present

## 2022-07-28 DIAGNOSIS — D631 Anemia in chronic kidney disease: Secondary | ICD-10-CM | POA: Diagnosis not present

## 2022-07-28 NOTE — Telephone Encounter (Signed)
FYILawerance Bach with [facility] called to just let you that patient had fall yesterday. His walker got stuck on something in the garage. Patient had no injury and no pain just wanted to let you    [phone]

## 2022-07-28 NOTE — Telephone Encounter (Signed)
Order faxed to [facility].  

## 2022-07-28 NOTE — Telephone Encounter (Signed)
If for some reason you cannot find the forms then feel free to send back to [place] again new forms to fill out thank you

## 2022-07-29 ENCOUNTER — Other Ambulatory Visit: Payer: Self-pay | Admitting: Family Medicine

## 2022-07-29 DIAGNOSIS — Z992 Dependence on renal dialysis: Secondary | ICD-10-CM | POA: Diagnosis not present

## 2022-07-29 DIAGNOSIS — N2581 Secondary hyperparathyroidism of renal origin: Secondary | ICD-10-CM | POA: Diagnosis not present

## 2022-07-29 DIAGNOSIS — N186 End stage renal disease: Secondary | ICD-10-CM | POA: Diagnosis not present

## 2022-08-02 ENCOUNTER — Ambulatory Visit (INDEPENDENT_AMBULATORY_CARE_PROVIDER_SITE_OTHER): Payer: Medicare HMO | Admitting: Family

## 2022-08-02 ENCOUNTER — Encounter (HOSPITAL_BASED_OUTPATIENT_CLINIC_OR_DEPARTMENT_OTHER): Payer: Self-pay | Admitting: Family

## 2022-08-02 VITALS — BP 150/70 | HR 90 | Ht 70.0 in | Wt 206.0 lb

## 2022-08-02 DIAGNOSIS — R110 Nausea: Secondary | ICD-10-CM

## 2022-08-02 DIAGNOSIS — I25118 Atherosclerotic heart disease of native coronary artery with other forms of angina pectoris: Secondary | ICD-10-CM

## 2022-08-02 DIAGNOSIS — I471 Supraventricular tachycardia, unspecified: Secondary | ICD-10-CM | POA: Diagnosis not present

## 2022-08-02 DIAGNOSIS — R002 Palpitations: Secondary | ICD-10-CM | POA: Diagnosis not present

## 2022-08-02 DIAGNOSIS — I480 Paroxysmal atrial fibrillation: Secondary | ICD-10-CM

## 2022-08-02 MED ORDER — METOPROLOL SUCCINATE ER 50 MG PO TB24: 50.0000 mg | ORAL_TABLET | Freq: Every day | ORAL | 11 refills | Status: DC

## 2022-08-02 MED ORDER — ONDANSETRON 4 MG PO TBDP: ORAL_TABLET | ORAL | 2 refills | Status: DC

## 2022-08-02 MED ORDER — AMIODARONE HCL 200 MG PO TABS: 200.0000 mg | ORAL_TABLET | Freq: Every day | ORAL | 2 refills | Status: DC

## 2022-08-02 NOTE — Patient Instructions (Signed)
Medication Instructions:  Your physician has recommended you make the following change in your medication:    Try to take Zofran 30 minutes before your morning medications.   Recommend splitting up your morning medications. Take half of them and then after 30 minutes take the other half.  RESUME Metoprolol Succinate at dose of 55m daily in the evening  ______  If we need the pharmacy to repackage your pills and it is expensive - we can consider a giftcard from the patient care fund to help with the cost.   *If you need a refill on your cardiac medications before your next appointment, please call your pharmacy*   Lab Work/Testing/Procedures: None ordered today.   Follow-Up: [facility] you and your health needs are our priority.  As part of our continuing mission to provide you with exceptional heart care, we have created designated Provider Care Teams.  These Care Teams include your primary Cardiologist (physician) and Advanced Practice Providers (APPs -  Physician Assistants and Nurse Practitioners) who all work together to provide you with the care you need, when you need it.  We recommend signing up for the patient portal called "MyChart".  Sign up information is provided on this After Visit Summary.  MyChart is used to connect with patients for Virtual Visits (Telemedicine).  Patients are able to view lab/test results, encounter notes, upcoming appointments, etc.  Non-urgent messages can be sent to your provider as well.   To learn more about what you can do with MyChart, go to [email or web address]    Your next appointment:   2-3 month(s)  Provider:   TSkeet Latch MD or CLaurann [place] NP    Other Instructions  We will reach out to [place] your [place] oxygen and let you know an update.

## 2022-08-02 NOTE — Progress Notes (Signed)
Office Visit    Patient Name: [name] Date of Encounter: 08/02/2022  PCP:  [name], MD   Kim  Cardiologist:  Skeet Latch, MD  Advanced Practice Provider:  No care team member to display Electrophysiologist:  None      Chief Complaint    [name] is a 67 y.o. male presents today for SVT, HFrEF, HTN follow up   Past Medical History    Past Medical History:  Diagnosis Date   Anemia    Arthritis    hips shoulders   CAD (coronary artery disease) 03/03/2019   Cancer ([place])    Esophageal cancer 2022   CHF (congestive heart failure) ([place]) 2020   Chronic combined systolic and diastolic heart failure ([place]) 04/26/2018   Admitted 11/14-11/20/19-diuresed 10L   Depression 05/31/2021   Fatigue 10/14/2020   Hypertension    Myocardial infarction [facility])    Sleep apnea    uses a bipap machine   Stroke ([place]) 12/14/2020   no last weakness or paralysis   Type 2 diabetes mellitus with diabetic nephropathy ([place]) 04/23/2019   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   Past Surgical History:  Procedure Laterality Date   AV FISTULA PLACEMENT Left 12/21/2021   Procedure: LEFT ARM ARTERIOVENOUS (AV) FISTULA CREATION;  Surgeon: Rosetta Posner, MD;  Location: [facility];  Service: Vascular;  Laterality: Left;   BIOPSY  06/27/2019   Procedure: BIOPSY;  Surgeon: Rogene Houston, MD;  [facility];  Service: Endoscopy;;  ascending colon polyp   BIOPSY  10/21/2020   Procedure: BIOPSY;  Surgeon: Rogene Houston, MD;  [facility];  Service: Endoscopy;;  esophageal,gastric polyp   BIOPSY  02/24/2021   Procedure: BIOPSY;  Surgeon: Rogene Houston, MD;  [facility];  Service: Endoscopy;;  distal and proximal esophageal biopsies    BIOPSY  05/30/2022   Procedure: BIOPSY;  Surgeon: Eloise Harman, DO;  [facility];  Service: Endoscopy;;   CARDIAC SURGERY     CATARACT EXTRACTION     COLONOSCOPY N/A  06/27/2019   [name]:Diverticulosis in the entire examined colon. tubular adenoma in ascending colon, 27m tubular adenoma in prox sigmoid. external hemorrhoids   CORONARY ARTERY BYPASS GRAFT N/A 03/25/2019   Procedure: CORONARY ARTERY BYPASS GRAFTING (CABG) X  4 USING LEFT INTERNAL MAMMARY ARTERY AND RIGHT SAPHENOUS VEIN GRAFTS;  Surgeon: LLajuana Matte MD;  [place]  Service: Open Heart Surgery;  Laterality: N/A;   ESOPHAGEAL BRUSHING  03/08/2022   Procedure: ESOPHAGEAL BRUSHING;  Surgeon: CEloise Harman DO;  [facility];  Service: Endoscopy;;   ESOPHAGOGASTRODUODENOSCOPY (EGD) WITH PROPOFOL N/A 10/21/2020   rehman:Normal hypopharynx.Normal proximal esophagus and mid esophagus.Esophageal mucosal changes consistent with long-segment Barrett's esophagus. (Focal low-grade dysplasia and atypia proximally) z line irregular 37 cm from incisors, 3 cm HH, single gastric polyp (fundic gland) normal duodenal bulb/second portion of duodenum, proximal margin of Barrett's at 34 cm   ESOPHAGOGASTRODUODENOSCOPY (EGD) WITH PROPOFOL N/A 02/24/2021   Procedure: ESOPHAGOGASTRODUODENOSCOPY (EGD) WITH PROPOFOL;  Surgeon: RRogene Houston MD;  [facility];  Service: Endoscopy;  Laterality: N/A;  1:35   ESOPHAGOGASTRODUODENOSCOPY (EGD) WITH PROPOFOL N/A 03/21/2021   Procedure: ESOPHAGOGASTRODUODENOSCOPY (EGD) WITH PROPOFOL;  Surgeon: HCarol Ada MD;  [place]  Service: Endoscopy;  Laterality: N/A;   ESOPHAGOGASTRODUODENOSCOPY (EGD) WITH PROPOFOL N/A 10/23/2021   Procedure: ESOPHAGOGASTRODUODENOSCOPY (EGD) WITH PROPOFOL;  Surgeon: DLoletha Carrow  Kirke Corin, MD;  Location: [place];  Service: Gastroenterology;  Laterality: N/A;   ESOPHAGOGASTRODUODENOSCOPY (EGD) WITH PROPOFOL N/A 03/08/2022   Procedure: ESOPHAGOGASTRODUODENOSCOPY (EGD) WITH PROPOFOL;  Surgeon: Eloise Harman, DO;  [facility];  Service: Endoscopy;  Laterality: N/A;    ESOPHAGOGASTRODUODENOSCOPY (EGD) WITH PROPOFOL N/A 05/27/2022   Procedure: ESOPHAGOGASTRODUODENOSCOPY (EGD) WITH PROPOFOL;  Surgeon: Harvel Quale, MD;  [facility];  Service: Gastroenterology;  Laterality: N/A;   ESOPHAGOGASTRODUODENOSCOPY (EGD) WITH PROPOFOL N/A 05/30/2022   Procedure: ESOPHAGOGASTRODUODENOSCOPY (EGD) WITH PROPOFOL;  Surgeon: Eloise Harman, DO;  [facility];  Service: Endoscopy;  Laterality: N/A;   EXCISION MORTON'S NEUROMA     EYE SURGERY Left    retina   INCISION AND DRAINAGE OF WOUND Right 06/28/2021   Procedure: IRRIGATION AND DEBRIDEMENT WOUND;  Surgeon: [name], MD;  Location: [place];  Service: Plastics;  Laterality: Right;  1.5 hour   INSERTION OF DIALYSIS CATHETER Right 12/21/2021   Procedure: INSERTION OF TUNNELED DIALYSIS CATHETER;  Surgeon: Rosetta Posner, MD;  [facility];  Service: Vascular;  Laterality: Right;   LEFT HEART CATH AND CORS/GRAFTS ANGIOGRAPHY N/A 02/01/2022   Procedure: LEFT HEART CATH AND CORS/GRAFTS ANGIOGRAPHY;  Surgeon: Martinique, Peter M, MD;  Location: [place] CV [facility];  Service: Cardiovascular;  Laterality: N/A;   POLYPECTOMY  06/27/2019   Procedure: POLYPECTOMY;  Surgeon: Rogene Houston, MD;  [facility];  Service: Endoscopy;;  proximal sigmoid colon   RIGHT/LEFT HEART CATH AND CORONARY ANGIOGRAPHY N/A 03/12/2019   Procedure: RIGHT/LEFT HEART CATH AND CORONARY ANGIOGRAPHY;  Surgeon: [name], MD;  Location: [place] [facility];  Service: Cardiovascular;  Laterality: N/A;   SHOULDER ARTHROSCOPY WITH ROTATOR CUFF REPAIR AND SUBACROMIAL DECOMPRESSION Right 08/26/2020   Procedure: RIGHT SHOULDER MINI OPEN ROTATOR CUFF REPAIR AND SUBACROMIAL DECOMPRESSION WITH PATCH GRAFT;  Surgeon: Susa Day, MD;  Location: [facility];  Service: Orthopedics;  Laterality: Right;  90 MINS GENERAL WITH BLOCK   SKIN SPLIT GRAFT Right 06/28/2021   Procedure: SKIN GRAFT SPLIT THICKNESS;  Surgeon:  [name], MD;  Location: [place];  Service: Plastics;  Laterality: Right;   TEE WITHOUT CARDIOVERSION N/A 03/25/2019   Procedure: TRANSESOPHAGEAL ECHOCARDIOGRAM (TEE);  Surgeon: [name], MD;  Location: [place];  Service: Open Heart Surgery;  Laterality: N/A;    Allergies  Allergies  Allergen Reactions   Bee Venom Anaphylaxis   Atorvastatin Other (See Comments)    myalgia   Diltiazem Itching   Lopressor [Metoprolol]     NIGHTMARES    Reglan [Metoclopramide] Other (See Comments)    Suicidal    Rosuvastatin Other (See Comments)    myalgias   Valsartan Itching    History of Present Illness    RAHIL REH is a 67 y.o. male with a hx of CVA 0000000, combined systolic and diastolic heart failure, PAD, depression, esophageal cancer, CAD post CABG 03/2019, HTN, MI, DM2, ESRD on HD, PVC last seen while hospitalized. Pleasant gentleman who previously worked as a Forensic psychologist in [employer].   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  Blood pressure control has been difficult due to intolerance to amlodipine, lisinopril, hydrochlorothiazide, beta-blocker due to sensation of fatigue. Has been able to tolerate Doxazosin and Valsartan.    Admitted 12/14/2020 MRI small subacute to chronic infarct in left centrum semiovale and no obvious to LVO. At follow-up with neurology ASA was increased to 329m. ZIO 12/24/20 due  to lightheadedness with predominantly NSR, 3.5% PVC burden, 3 episodes NSVT up to 5 beats and up to 12 runs of SVT.     Carotid duplex 01/21/21 no significant stenosis bilaterally. Due to exertional dyspnea myoview performed 02/2021 with infarct with mild peri-infarct ischemia recommended for medical management given CKD.    He was seen 08/04/21 volume up - Torsemide dose increased for 4 days, referred to SW, referred to home health as well as re-referred to psychology for depression. Seen 08/25/21 with weight up but edema decreased and same Torsemide dose continued.     Hospitalized 08/29/21-09/01/21 for decompensated heart failure with weight gain of 15 lbs. IV diuresed and discharged on Torsemide 60m QD. Diuresed 10 pounds with discharge weight 203.2 lbs.    [facility].  Underwent EGD with RFA 09/27/2021.  Hemoglobin remained stable w/o transfusion.  GI felt symptoms were treated to recent RFA and did not recommend repeat endoscopy.  Aspirin resumed on discharge. He underwent PET scan due to known esophageal adenocarcinoma which oncology felt represented local irritation/inflammation and not progression.     Admitted 5/10 - 10/28/2021 with acute on chronic heart failure.  He was diuresed 8.2 L and down 17 pounds. Discharged on torsemide 40 mg daily.  He required 4 units PRBC.  EGD with esophageal stenosis, circumferential clean ulceration in distal esophagus. Discharge weight 187.66.   On 01/07/22 he started HD. Follows with Dr. GSuezanne Cheshire    Admitted 8/19-8/25/23 for chest pain. LHC 02/01/22 with patent grafts recommended for medical management. VT during HD 02/01/22 that did not recur.    Admitted 9/18-9/26/23 after dyspnea at HD requiring 4L and BIPAP. Diagnosed and treated for gram negative bacteremia. CT showed no PE but did show large right pleural effusion. Underwent 1.3L thoracentesis. Metoprolol succinate adjusted to 1019mQD  Admitted 11/21-11/23/23 with Enterobacter cloacae complex bacteremia with [facility].  Admitted 12/15 - 05/31/2022 with hematemesis and acute blood loss anemia requiring pantoprazole drip.  Repeat EGD 05/30/2022 no active bleeding or stigmata of bleeding, moderately severe esophagitis with no bleeding, esophageal mucosal changes secondary to established short segment Barrett's disease.  Amiodarone 200 mg daily was continued due to SVT.  Hydralazine and doxazosin initially held for soft blood pressures.  Seen in ED for chest pain 06/23/2022 and cardiology consulted.   Sudden onset of palpitations associate with chest pain while sitting watching TV.  Associated dizziness/lightheadedness.  Sublingual nitroglycerin did not improve his pain. HS troponin 42 ? 40 which were similar to previous. Chest pain deemed noncardiac, most likely due to GERD/esophageal cancer.  Metoprolol succinate switched to tartrate 75 mg twice daily. One week monitor placed.  However he is visibly did not tolerate and requested to return to succinate.  Presents today for follow up with his friend [place] 2 week history of nausea and has been unable to take his cardiac medications. He took Zofran this morning and gives him a few hours of relief. He is waking up nauseous. Has endoscopy later this week and plans to discuss with GI. Can get down a little bit of water or a small piece of bread or slice of cheese.   Feels dizzy, lightheaded, unsteady on his feet. Completed HH PT.Has had multiple falls thankfully without significant injury. 3-4 weeks ago did fall down his 2 stairs between rooms and bruised his ribs. Most concerned by weakness, disorientation. Tells me his head feels like he is in  a brain fog. He notes this has been ongoing over a month. Also notes he has been out of oxygen at home for about a month and help line was not helpful.   His blood pressure at HD has been 140s-150s. Notes he did not go Monday to HD as he was having diarrhea. Notes just one day of diarrhea.   Notes will sleep an hour or two then be wide awake. Is interested in medication to help with sleep. Goes to bed around 10:30pm and gets out of the bed around 7-7:30am. Wakes up 5-6 times during the night. PCP previously had him on trazadone but he does not think he tolerated well. Encouraged to trial OTC melatonin and discuss with PCP at follow up.   He is taking AM: Amiodarone 262m Aspirin 81 Doxazosin 265mHydralazine Pantroprazole Zoloft Vitamin c Calcium acetate NOON Hydralazine, calcium acetate PM *  calcium, hydralazine pantroprazole,   Has one month of medicine.   [facility]. He has home fill system - should be able to fill at home.    EKGs/Labs/Other Studies Reviewed:   The following studies were reviewed today:  Echocardiogram 03/01/2022:  1. Left ventricular ejection fraction, by estimation, is 40 to 45%. The  left ventricle has mildly decreased function. The left ventricle  demonstrates global hypokinesis. There is moderate left ventricular  hypertrophy. Left ventricular diastolic  parameters are indeterminate.   2. Ventricular septum is flattened in systole consistent with RV pressure  overload. . Right ventricular systolic function is moderately reduced. The  right ventricular size is severely enlarged. There is mildly elevated  pulmonary artery systolic  pressure.   3. Left atrial size was severely dilated.   4. Catheter tip noted in RA. Right atrial size was severely dilated.   5. The mitral valve is normal in structure. No evidence of mitral valve  regurgitation. No evidence of mitral stenosis.   6. The tricuspid valve is abnormal.   7. The aortic valve is tricuspid. There is mild calcification of the  aortic valve. There is mild thickening of the aortic valve. Aortic valve  regurgitation is not visualized. No aortic stenosis is present.   8. The inferior vena cava is dilated in size with >50% respiratory  variability, suggesting right atrial pressure of 8 mmHg.    LHC 02/01/22   Mid LM to Dist LM lesion is 25% stenosed.   Ost LAD to Prox LAD lesion is 50% stenosed.   Ost Cx to Prox Cx lesion is 90% stenosed.   Dist RCA-1 lesion is 90% stenosed.   Mid RCA lesion is 75% stenosed.   1st Diag lesion is 80% stenosed.   2nd Diag lesion is 75% stenosed.   1st Mrg lesion is 80% stenosed.   2nd Mrg lesion is 80% stenosed.   Mid LAD lesion is 100% stenosed.   RPAV lesion is 80% stenosed.   Dist RCA-2 lesion is 100%  stenosed.   SVG graft was visualized by angiography and is normal in caliber.   SVG graft was visualized by angiography and is normal in caliber.   LIMA graft was visualized by angiography and is large.   SVG graft was visualized by angiography and is normal in caliber.   The graft exhibits no disease.   The graft exhibits no disease.   The graft exhibits no disease.   The graft exhibits no disease.   LV end diastolic pressure is mildly elevated.  Severe 3 vessel obstructive CAD Patent LIMA to the LAD Patent SVG to large second diagonal Patent SVG to OM Patent SVG to PL branch of the RCA Elevated LVEDP 25 mm Hg   Plan: continue medical management.   Echo 04/26/2018: - Left ventricle: The cavity size was normal. There was minimal    concentric hypertrophy. Systolic function was mildly reduced. The    estimated ejection fraction was in the range of 45% to 50%.    Diffuse hypokinesis. Findings consistent with left ventricular    diastolic dysfunction, grade indeterminate. Doppler parameters    are consistent with high ventricular filling pressure.  - Mitral valve: Mildly calcified annulus. There was mild    regurgitation.  - Left atrium: The atrium was severely dilated.  - Right atrium: The atrium was mildly dilated.  - Inferior vena cava: The vessel was dilated. The respirophasic    diameter changes were blunted (< 50%), consistent with elevated    central venous pressure. Estimated CVP 15 mmHg.    ___________________________________________________________   Right/Left Heart Cath and Coronary Angiography 03/12/2019: Mid LAD lesion is 80% stenosed. Ost LAD to Mid LAD lesion is 50% stenosed. 1st Diag lesion is 80% stenosed. 2nd Diag lesion is 75% stenosed. Mid LM to Dist LM lesion is 25% stenosed. Prox Cx lesion is 90% stenosed. 1st Mrg lesion is 80% stenosed. 2nd Mrg lesion is 80% stenosed. Dist RCA lesion is 90% stenosed. Mid RCA lesion is 75% stenosed. RPAV lesion is  80% stenosed. LV end diastolic pressure is moderately elevated. There is no aortic valve stenosis. Hemodynamic findings consistent with mild pulmonary hypertension.   Severe multivessel CAD.  Plan for outpatient evaluation for CABG>  His symptoms are all with exertion.  He feels fine at rest.  I instructed him to avoid any strenuous activity, or anything that would bring on shortness of breath.  Continue aspirin and Imdur.   Continue Crestor.    ___________________________________________________________   Echo 03/15/2019  1. Left ventricular ejection fraction, by visual estimation, is 45 to  50%. The left ventricle has mildly decreased function. There is mildly  increased left ventricular hypertrophy.   2. Left ventricular diastolic Doppler parameters are consistent with  impaired relaxation pattern of LV diastolic filling.   3. Global right ventricle has normal systolic function.The right  ventricular size is normal. No increase in right ventricular wall  thickness.   4. Left atrial size was severely dilated.   5. Right atrial size was normal.   6. Mild mitral annular calcification.   7. Moderate calcification of the mitral valve leaflet(s).   8. Moderate thickening of the mitral valve leaflet(s).   9. The mitral valve is abnormal. Trace mitral valve regurgitation. No  evidence of mitral stenosis.  10. The tricuspid valve is normal in structure. Tricuspid valve  regurgitation is trivial.  11. The aortic valve is tricuspid Aortic valve regurgitation was not  visualized by color flow Doppler. Mild aortic valve sclerosis without  stenosis.  12. The pulmonic valve was not well visualized. Pulmonic valve  regurgitation is mild by color flow Doppler.  13. Mildly elevated pulmonary artery systolic pressure.   ___________________________________________________________   VAS US DOPPLER PRE CABG 03/21/2019:  Right Carotid: Velocities in the right ICA are consistent with a 1-39%  stenosis.    Left Carotid: There is no evidence of stenosis in the left ICA.  Vertebrals: Bilateral vertebral arteries demonstrate antegrade flow.   Bilateral Extremity: Doppler waveforms remain within normal limits with  compression bilaterally for the radial arteries. Doppler waveforms remain  within normal limits with compression bilaterally for the ulnar arteries.   ___________________________________________________________   ECHO INTRAOPERATIVE TEE 03/25/2019: POST-OP IMPRESSIONS  - Aorta: The aorta appears unchanged from pre-bypass.  - Left Atrial Appendage: The left atrial appendage appears unchanged from  pre-bypass.  - Aortic Valve: The aortic valve appears unchanged from pre-bypass.  - Mitral Valve: The mitral valve appears unchanged from pre-bypass.  - Tricuspid Valve: The tricuspid valve appears unchanged from pre-bypass.  - Pericardium: The pericardium appears unchanged from pre-bypass.   ___________________________________________________________   VAS [place] LOWER EXTREMITY VENOUS 03/30/2019: Right: There is no evidence of deep vein thrombosis in the lower  extremity.  Left: No evidence of common femoral vein obstruction.   ___________________________________________________________   Echo 05/29/2019 1. Left ventricular ejection fraction, by visual estimation, is 50 to  55%. The left ventricle has low normal function. There is mildly increased  left ventricular hypertrophy.   2. Basal anterolateral segment, basal inferior segment, and basal  inferolateral segment are abnormal.   3. The left ventricle demonstrates regional wall motion abnormalities.   4. Global right ventricle has mildly reduced systolic function.The right  ventricular size is normal. No increase in right ventricular wall  thickness.   5. Left atrial size was moderately dilated.   6. Right atrial size was normal.   7. Moderate mitral annular calcification.   8. The mitral valve is grossly normal. Trivial  mitral valve  regurgitation.   9. The tricuspid valve is grossly normal. Tricuspid valve regurgitation  is trivial.  10. The aortic valve is tricuspid. Aortic valve regurgitation is not  visualized.  11. The pulmonic valve was grossly normal. Pulmonic valve regurgitation is  trivial.  12. Normal pulmonary artery systolic pressure.  13. The tricuspid regurgitant velocity is 2.30 m/s, and with an assumed  right atrial pressure of 3 mmHg, the estimated right ventricular systolic  pressure is normal at 24.2 mmHg.  14. The inferior vena cava is normal in size with greater than 50%  respiratory variability, suggesting right atrial pressure of 3 mmHg.   ___________________________________________________________   Echo 09/2019:  1. Left ventricular ejection fraction, by estimation, is 55 to 60%. The  left ventricle has normal function. The left ventricle has no regional  wall motion abnormalities. There is moderate left ventricular hypertrophy.  Left ventricular diastolic  parameters are consistent with Grade I diastolic dysfunction (impaired  relaxation).   2. Right ventricular systolic function is normal. The right ventricular  size is normal.   3. Left atrial size was severely dilated.   4. The mitral valve is normal in structure. Trivial mitral valve  regurgitation. No evidence of mitral stenosis.   5. The aortic valve was not well visualized. Aortic valve regurgitation  is not visualized. No aortic stenosis is present.   6. The inferior vena cava is normal in size with greater than 50%  respiratory variability, suggesting right atrial pressure of 3 mmHg.   ___________________________________________________________   Carotid duplex 01/21/21  Right: Mild to moderate partially calcified plaque. No significant stenosis.  Left: Mild partially calcified plaque. No signifcant stenosis.   ___________________________________________________________   Brantley Fling 02/2021 Findings are consistent  with prior myocardial infarction with peri-infarct ischemia. The study is high risk.   No ST deviation was noted.   LV perfusion is abnormal. There is evidence of ischemia. There is evidence of infarction. Defect 1: There is a medium defect with moderate reduction in uptake present in  the mid to basal inferior and inferolateral location(s) that is fixed. There is abnormal wall motion in the defect area. Consistent with infarction and peri-infarct ischemia.   Left ventricular function is abnormal. Global function is severely reduced. There was a single regional abnormality. Nuclear stress EF: 28 %. The left ventricular ejection fraction is severely decreased (<30%). End diastolic cavity size is moderately enlarged. End systolic cavity size is moderately enlarged.   Prior study available for comparison from 09/25/2019. There are changes compared to prior study. The left ventricular ejection fraction has decreased. LVEF 35%, moderate size and intensity fixed inferior defect suggestive of scar with mild peri-infarct ischemia   Moderate size and intensity, mostly fixed basal to mid inferior and inferolateral perfusion defect (SDS 1), suggestive of scar with minimal peri-infarct ischemia. LVEF 28% with global hypokinesis and basal to mid inferior akinesis. This is a high risk study. In comparison with a prior study in 2021, the LVEF is lower (was previously 35%), however, there are no new reversible perfusion defects.  ___________________________________________________________   Echo 07/25/21  1. Left ventricular ejection fraction, by estimation, is 40 to 45%. The  left ventricle has mildly decreased function. The left ventricle  demonstrates regional wall motion abnormalities (see scoring  diagram/findings for description). There is mild  concentric left ventricular hypertrophy. Left ventricular diastolic  function could not be evaluated. There is akinesis of the left  ventricular, basal inferior wall and  inferoseptal wall.   2. Right ventricular systolic function is moderately reduced. The right  ventricular size is severely enlarged. There is moderately elevated  pulmonary artery systolic pressure. The estimated right ventricular  systolic pressure is 123XX123 mmHg.   3. Left atrial size was moderately dilated.   4. The mitral valve is degenerative. Mild to moderate mitral valve  regurgitation. No evidence of mitral stenosis.   5. The aortic valve is normal in structure. Aortic valve regurgitation is  not visualized. No aortic stenosis is present.   6. Aortic dilatation noted. There is mild dilatation of the aortic root,  measuring 39 mm.   7. The inferior vena cava is dilated in size with <50% respiratory  variability, suggesting right atrial pressure of 15 mmHg.     EKG:  EKG is not ordered today.    Recent Labs: 03/03/2022: TSH 2.367 03/06/2022: Magnesium 2.0 05/03/2022: B Natriuretic Peptide >4,500.0 06/23/2022: ALT 17; BUN 28; Creatinine, Ser 3.45; Hemoglobin 11.7; Platelets 294; Potassium 4.1; Sodium 136  Recent Lipid Panel    Component Value Date/Time   CHOL 92 01/31/2022 1330   CHOL 168 12/25/2020 1208   TRIG 89 01/31/2022 1330   HDL 50 01/31/2022 1330   HDL 48 12/25/2020 1208   CHOLHDL 1.8 01/31/2022 1330   VLDL 18 01/31/2022 1330   LDLCALC 24 01/31/2022 1330   LDLCALC 76 12/25/2020 1208    Home Medications   Current Meds  Medication Sig   acetaminophen (TYLENOL) 325 MG tablet Take 2 tablets (650 mg total) by mouth every 6 (six) hours as needed for mild pain, moderate pain or fever.   albuterol (VENTOLIN HFA) 108 (90 Base) MCG/ACT inhaler Inhale 2 puffs into the lungs every 6 (six) hours as needed. (Patient taking differently: Inhale 2 puffs into the lungs every 6 (six) hours as needed for wheezing or shortness of breath.)   ascorbic acid (VITAMIN C) 500 MG tablet Take 1 tablet (500 mg total) by mouth daily.   calcium acetate (PHOSLO) 667 MG capsule Take 667 mg by mouth  in the morning, at noon, and at bedtime.   EPINEPHrine 0.3 mg/0.3 mL IJ SOAJ injection Inject 0.3 mg into the muscle as needed for anaphylaxis.   Evolocumab (REPATHA SURECLICK) [id number] MG/ML SOAJ Inject 140 mg into the skin every 14 (fourteen) days.   LANTUS 100 UNIT/ML injection 8 mLs daily. Sliding scale   nitroGLYCERIN (NITROSTAT) 0.4 MG SL tablet Place 1 tablet (0.4 mg total) under the tongue every 5 (five) minutes as needed for chest pain.   pantoprazole (PROTONIX) 40 MG tablet Take 1 tablet (40 mg total) by mouth 2 (two) times daily.   sertraline (ZOLOFT) 100 MG tablet TAKE 1 TABLET BY MOUTH DAILY   [DISCONTINUED] ondansetron (ZOFRAN-ODT) 4 MG disintegrating tablet TAKE 1 TABLET BY MOUTH EVERY 8 HOURS AS NEEDED FOR NAUSEA AND VOMITING     Review of Systems     All other systems reviewed and are otherwise negative except as noted above.  Physical Exam    VS:  BP (!) 150/70   Pulse 90   Ht 5' 10"$  (1.778 m)   Wt 206 lb (93.4 kg)   BMI 29.56 kg/m  , BMI Body mass index is 29.56 kg/m.  Wt Readings from Last 3 Encounters:  08/02/22 206 lb (93.4 kg)  06/23/22 174 lb 2.6 oz (79 kg)  05/31/22 175 lb 14.8 oz (79.8 kg)    GEN: Well nourished, overweight, well developed, in no acute distress. HEENT: normal. Neck: Supple, no JVD, carotid bruits, or masses. Cardiac: RRR, no murmurs, rubs, or gallops. No clubbing, cyanosis, edema.  Radials/PT 2+ and equal bilaterally.  Respiratory:  Respirations regular and unlabored, clear to auscultation bilaterally. GI: Soft, nontender, nondistended. MS: No deformity or atrophy. Skin: Warm and dry, no rash. Neuro:  Strength and sensation are intact. Psych: Normal affect.  Assessment & Plan    CAD s/p CABG x4 with patent grafts by [facility] 01/2022 - No chest pain. Continue aspirin, metoprolol, repatha.   HFrEF - volume status per HD. GDMT limited by renal function. GDMT includes hydralazine, metoprolol.   On home O2 - has been out for 2-4 weeks with  sensation of needing a deep breath, fatigue. O2 level in clinic appropriate. [facility] phone [phone]. Per my discussion with them I as the provider am unable to request someone come to the home. Provided [name] (his friend) with their number to call and place ticket.  Hx of SVT / Palpitations - Reports episodes of palpitations but has been taking medications irregularly over the last 2 weeks. Notes Metoprolol is difficult to split in half, will reduce Toprol from 62m to 559mQD. Refill provided. Discussed tactics to manage nausea and promote medication management as below.  Nausea / medication management - 2 week history of nausea likely related to GERD/esophageal cancer. Refill of Zofran provided today. Encouraged to take 30 minutes prior to morning meals. Discussed could take half of his morning medications then after 30 minutes take the other half. Discussed moving some medications to evening dosing but he has plenty of pill packs and notes his pharmacy will charge him to repack. If needed, could give him resources through Patient Care Fund to assist with cost of repacking. He will contact usKoreaf these tactics do not improve nausea.  ESRD on HD - Follows with nephrology (Dr. GoClover Mealy HYPERTENSION CONTROL Vitals:   08/02/22 1340 08/02/22 1709  BP: (!) 154/74 (!) 150/70    The patient's blood pressure is elevated above target today.  In order to  address the patient's elevated BP: Follow up with general cardiology has been recommended. (did not take medications today due to nausea - addressed at this visit)      Disposition: Follow up in 2 month(s) with Skeet Latch, MD or APP.  Signed, Loel Dubonnet, NP 08/02/2022, 5:10 PM [place]

## 2022-08-02 NOTE — Telephone Encounter (Signed)
So noted thank you-he has a follow-up office visit next week with [place]

## 2022-08-03 DIAGNOSIS — Z992 Dependence on renal dialysis: Secondary | ICD-10-CM | POA: Diagnosis not present

## 2022-08-03 DIAGNOSIS — N2581 Secondary hyperparathyroidism of renal origin: Secondary | ICD-10-CM | POA: Diagnosis not present

## 2022-08-03 DIAGNOSIS — N186 End stage renal disease: Secondary | ICD-10-CM | POA: Diagnosis not present

## 2022-08-04 ENCOUNTER — Telehealth: Payer: Self-pay | Admitting: Family Medicine

## 2022-08-04 ENCOUNTER — Encounter: Payer: Self-pay | Admitting: Family Medicine

## 2022-08-04 DIAGNOSIS — K2100 Gastro-esophageal reflux disease with esophagitis, without bleeding: Secondary | ICD-10-CM | POA: Diagnosis not present

## 2022-08-04 DIAGNOSIS — K2950 Unspecified chronic gastritis without bleeding: Secondary | ICD-10-CM | POA: Diagnosis not present

## 2022-08-04 DIAGNOSIS — R1310 Dysphagia, unspecified: Secondary | ICD-10-CM | POA: Diagnosis not present

## 2022-08-04 DIAGNOSIS — K449 Diaphragmatic hernia without obstruction or gangrene: Secondary | ICD-10-CM | POA: Diagnosis not present

## 2022-08-04 DIAGNOSIS — R1110 Vomiting, unspecified: Secondary | ICD-10-CM | POA: Diagnosis not present

## 2022-08-04 DIAGNOSIS — C159 Malignant neoplasm of esophagus, unspecified: Secondary | ICD-10-CM | POA: Diagnosis not present

## 2022-08-04 DIAGNOSIS — R130 Aphagia: Secondary | ICD-10-CM | POA: Diagnosis not present

## 2022-08-04 DIAGNOSIS — Z8719 Personal history of other diseases of the digestive system: Secondary | ICD-10-CM | POA: Diagnosis not present

## 2022-08-04 DIAGNOSIS — Z8501 Personal history of malignant neoplasm of esophagus: Secondary | ICD-10-CM | POA: Diagnosis not present

## 2022-08-04 NOTE — Telephone Encounter (Signed)
Nurses Please talk with the patient Find out does he currently have a home health nurse coming out to see him? If so what home health agency are they with  Also we have been working through the social service worker to try to get him evaluated for having an aide in the home.  At this present moment has he heard anything from this process?  Finally please talk with the patient to find out what type of ADLs does he need help with?  Shopping?  Fixing food?  Light housekeeping?  Bathing?  Bathroom?  Transportation within the house?  This will help [place] address his needs as best as possible  Thanks-[name]

## 2022-08-04 NOTE — Telephone Encounter (Signed)
Nurses Please communicate with Dr. Harlene Salts is an emergency department physician who works with [place] Not quite sure why she is having a hard time signing onto my chart with [facility] [place] I have mentioned in significant website then [facility] Please work with [employer] manager-to help connect [name] with appropriate people so that she can have access [name] on previous discussion granted her permission for the ability to represent him and utilizes MyChart)  #2-please let Dr.Chriswell know that I did look over his medications and looked over the last cardiology note.  I do not see anything obvious that is causing his fatigue.  Certainly he has multiple health issues going on all at 1 time which could be contributing.  We have an office visit with him coming up on February 29 at 10 AM at that time I will make sure that we give a thorough evaluation and review with [name] and discussed with him any concerns or questions.  I certainly can give a call to [name] during part of that visit to be able to have her on speaker phone while we discussed the plan  If for some reason she is not available I can always do a follow-up call later that day  Thanks-Dr. Nicki [name]

## 2022-08-05 DIAGNOSIS — N186 End stage renal disease: Secondary | ICD-10-CM | POA: Diagnosis not present

## 2022-08-05 DIAGNOSIS — Z992 Dependence on renal dialysis: Secondary | ICD-10-CM | POA: Diagnosis not present

## 2022-08-05 DIAGNOSIS — N2581 Secondary hyperparathyroidism of renal origin: Secondary | ICD-10-CM | POA: Diagnosis not present

## 2022-08-05 NOTE — Telephone Encounter (Signed)
Sent mychart message to patient

## 2022-08-08 DIAGNOSIS — N186 End stage renal disease: Secondary | ICD-10-CM | POA: Diagnosis not present

## 2022-08-08 DIAGNOSIS — N2581 Secondary hyperparathyroidism of renal origin: Secondary | ICD-10-CM | POA: Diagnosis not present

## 2022-08-08 DIAGNOSIS — Z992 Dependence on renal dialysis: Secondary | ICD-10-CM | POA: Diagnosis not present

## 2022-08-10 ENCOUNTER — Ambulatory Visit: Payer: Self-pay | Admitting: *Deleted

## 2022-08-10 DIAGNOSIS — Z992 Dependence on renal dialysis: Secondary | ICD-10-CM | POA: Diagnosis not present

## 2022-08-10 DIAGNOSIS — N2581 Secondary hyperparathyroidism of renal origin: Secondary | ICD-10-CM | POA: Diagnosis not present

## 2022-08-10 DIAGNOSIS — N186 End stage renal disease: Secondary | ICD-10-CM | POA: Diagnosis not present

## 2022-08-10 NOTE — Patient Outreach (Signed)
  Care Coordination   08/10/2022  Name: [name] MRN: [id number] DOB: 1955-12-03   Care Coordination Outreach Attempts:  An unsuccessful telephone outreach was attempted today to offer the patient information about available care coordination services as a benefit of their health plan. HIPAA compliant message left on voicemail, providing contact information for CSW, encouraging patient to return CSW's call at his earliest convenience.  Follow Up Plan:  Additional outreach attempts will be made to offer the patient care coordination information and services.   Encounter Outcome:  No Answer.   Care Coordination Interventions:  No, not indicated.    [name], BSW, MSW, LCSW  Licensed Education officer, [facility]  Mailing [place] [address], [place], [place] [address] Physical Address-[address] [place], [place] [address] Toll Free Main # [phone] Fax # [phone] Cell # [phone] [email or web address]

## 2022-08-11 ENCOUNTER — Other Ambulatory Visit: Payer: Self-pay | Admitting: Family Medicine

## 2022-08-11 ENCOUNTER — Telehealth: Payer: Self-pay

## 2022-08-11 ENCOUNTER — Ambulatory Visit (INDEPENDENT_AMBULATORY_CARE_PROVIDER_SITE_OTHER): Payer: Medicare HMO | Admitting: Family Medicine

## 2022-08-11 VITALS — BP 132/68 | HR 90 | Wt 199.0 lb

## 2022-08-11 DIAGNOSIS — E440 Moderate protein-calorie malnutrition: Secondary | ICD-10-CM | POA: Diagnosis not present

## 2022-08-11 DIAGNOSIS — N186 End stage renal disease: Secondary | ICD-10-CM | POA: Diagnosis not present

## 2022-08-11 DIAGNOSIS — Z72820 Sleep deprivation: Secondary | ICD-10-CM | POA: Diagnosis not present

## 2022-08-11 DIAGNOSIS — G4730 Sleep apnea, unspecified: Secondary | ICD-10-CM | POA: Diagnosis not present

## 2022-08-11 DIAGNOSIS — D631 Anemia in chronic kidney disease: Secondary | ICD-10-CM | POA: Diagnosis not present

## 2022-08-11 DIAGNOSIS — I739 Peripheral vascular disease, unspecified: Secondary | ICD-10-CM

## 2022-08-11 DIAGNOSIS — C159 Malignant neoplasm of esophagus, unspecified: Secondary | ICD-10-CM

## 2022-08-11 DIAGNOSIS — Z992 Dependence on renal dialysis: Secondary | ICD-10-CM

## 2022-08-11 DIAGNOSIS — I5043 Acute on chronic combined systolic (congestive) and diastolic (congestive) heart failure: Secondary | ICD-10-CM | POA: Diagnosis not present

## 2022-08-11 DIAGNOSIS — E1159 Type 2 diabetes mellitus with other circulatory complications: Secondary | ICD-10-CM | POA: Diagnosis not present

## 2022-08-11 DIAGNOSIS — R0602 Shortness of breath: Secondary | ICD-10-CM

## 2022-08-11 DIAGNOSIS — I25118 Atherosclerotic heart disease of native coronary artery with other forms of angina pectoris: Secondary | ICD-10-CM | POA: Diagnosis not present

## 2022-08-11 DIAGNOSIS — I509 Heart failure, unspecified: Secondary | ICD-10-CM | POA: Diagnosis not present

## 2022-08-11 DIAGNOSIS — T24231D Burn of second degree of right lower leg, subsequent encounter: Secondary | ICD-10-CM | POA: Diagnosis not present

## 2022-08-11 DIAGNOSIS — E1165 Type 2 diabetes mellitus with hyperglycemia: Secondary | ICD-10-CM | POA: Diagnosis not present

## 2022-08-11 DIAGNOSIS — I132 Hypertensive heart and chronic kidney disease with heart failure and with stage 5 chronic kidney disease, or end stage renal disease: Secondary | ICD-10-CM | POA: Diagnosis not present

## 2022-08-11 DIAGNOSIS — F321 Major depressive disorder, single episode, moderate: Secondary | ICD-10-CM | POA: Diagnosis not present

## 2022-08-11 DIAGNOSIS — E1122 Type 2 diabetes mellitus with diabetic chronic kidney disease: Secondary | ICD-10-CM | POA: Diagnosis not present

## 2022-08-11 DIAGNOSIS — K219 Gastro-esophageal reflux disease without esophagitis: Secondary | ICD-10-CM | POA: Diagnosis not present

## 2022-08-11 NOTE — Telephone Encounter (Signed)
Pt is calling back Dr Nicki Reaper wanted to know his [place] it is [facility]

## 2022-08-11 NOTE — Progress Notes (Signed)
Subjective:    Patient ID: [name], male    DOB: 04/26/1956, 67 y.o.   MRN: [id number]  HPI Patient arrives for 3 month follow up. 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[facility]), Chronic  Malnutrition of moderate degree (HCC), Chronic  Depression, major, single episode, moderate (HCC), Chronic  Peripheral arterial disease ([place]), Chronic  DM type 2 causing vascular disease ([place]), Chronic    Review of Systems     Objective:   Physical Exam General-in no acute distress Eyes-no discharge Lungs-respiratory rate normal, CTA CV-no murmurs,RRR Extremities skin warm dry no edema Neuro grossly normal Behavior normal, alert        Assessment & Plan:  Very nice patient Has significant complex issues Is on dialysis has heart failure diabetes sleep apnea Is still situation is difficult He does have a dear friend who helps him he also has family members who are concerned but he does not have anyone who stays with him He would benefit from having an assistant preferably on a daily basis for multiple hours a day 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  He cannot propel himself with his arms with a wheelchair We will look into a mobilized scooter He sees cardiology on a regular basis He will continue to see the nephrologist through dialysis that he does 3 times per week He will also see sleep specialist we will do the referral for sleep apnea update This could be contributing to his fatigue and tiredness He sees a diabetes doctor every 4 to  6 months but we do not have documentation unfortunately they are not on the electronic record system Recent labs reviewed Medications reviewed Follow-up in 3 to 4 months His sister participated in the discussion during the assessment and planning, she is emergency room physician who currently works for the [employer], the patient gives full permission for her to have access to his record and to help discuss medical issues with she will keep [place] updated with any insights that she has We are certainly open to this participation  1. Peripheral arterial disease ([place]) Followed by specialist.  No claudication currently.  2. Depression, major, single episode, moderate ([place]) Patient trying to manage this as best he can.  Dealing with chronic illness and inability to do activity compared to where he was also his home situation is subpar currently taking Zoloft  3. Malignant neoplasm of esophagus, unspecified location [facility]) Managed by [facility] gastroenterology  4. Congestive heart failure, unspecified HF chronicity, unspecified heart failure type [facility]) Managed by cardiology patient having some issues with significant weakness and shortness of breath when he tries to do much walking with a walker or stay physically active. - Ambulatory referral to Neurology  5. Malnutrition of moderate degree ([place]) He has a difficult time eating and keeping food down because of esophageal issues could have some mild achalasia issues due to esophageal scarring.  Doubtful as gastroparesis  6. DM type 2 causing vascular disease ([place]) We will try to get the results of his most recent A1c through his endocrinologist patient is uncertain what it was  7. ESRD (  end stage renal disease) on dialysis [facility]) Dialysis 3 times a week this drains him.  This contributes to his fatigue.  I believe his fatigue is multifactorial partly due to heart failure partly due to comorbid disease and also the cancer as well as  end-stage renal disease on dialysis  8. Poor sleep Patient has significant sleep apnea issues we will get him in with sleep specialist for sleep study more than likely will need to have updated settings for his CPAP machine - Ambulatory referral to Neurology  9. Sleep apnea, unspecified type Please see discussion above - Ambulatory referral to Neurology Follow-up 3 months  We will assist with trying to find out from his home health provider how to go about seeing if he qualifies for a scooter or motorized wheelchair for ambulatory purposes

## 2022-08-12 DIAGNOSIS — N186 End stage renal disease: Secondary | ICD-10-CM | POA: Diagnosis not present

## 2022-08-12 DIAGNOSIS — N2581 Secondary hyperparathyroidism of renal origin: Secondary | ICD-10-CM | POA: Diagnosis not present

## 2022-08-12 DIAGNOSIS — Z992 Dependence on renal dialysis: Secondary | ICD-10-CM | POA: Diagnosis not present

## 2022-08-15 ENCOUNTER — Encounter: Payer: Self-pay | Admitting: Family Medicine

## 2022-08-15 DIAGNOSIS — N2581 Secondary hyperparathyroidism of renal origin: Secondary | ICD-10-CM | POA: Diagnosis not present

## 2022-08-15 DIAGNOSIS — N186 End stage renal disease: Secondary | ICD-10-CM | POA: Diagnosis not present

## 2022-08-15 DIAGNOSIS — E1165 Type 2 diabetes mellitus with hyperglycemia: Secondary | ICD-10-CM | POA: Diagnosis not present

## 2022-08-15 DIAGNOSIS — Z992 Dependence on renal dialysis: Secondary | ICD-10-CM | POA: Diagnosis not present

## 2022-08-16 ENCOUNTER — Ambulatory Visit: Payer: Self-pay | Admitting: *Deleted

## 2022-08-16 ENCOUNTER — Encounter: Payer: Self-pay | Admitting: *Deleted

## 2022-08-16 ENCOUNTER — Telehealth: Payer: Self-pay | Admitting: Family Medicine

## 2022-08-16 DIAGNOSIS — I25118 Atherosclerotic heart disease of native coronary artery with other forms of angina pectoris: Secondary | ICD-10-CM | POA: Diagnosis not present

## 2022-08-16 DIAGNOSIS — N186 End stage renal disease: Secondary | ICD-10-CM | POA: Diagnosis not present

## 2022-08-16 DIAGNOSIS — D631 Anemia in chronic kidney disease: Secondary | ICD-10-CM | POA: Diagnosis not present

## 2022-08-16 DIAGNOSIS — I132 Hypertensive heart and chronic kidney disease with heart failure and with stage 5 chronic kidney disease, or end stage renal disease: Secondary | ICD-10-CM | POA: Diagnosis not present

## 2022-08-16 DIAGNOSIS — K219 Gastro-esophageal reflux disease without esophagitis: Secondary | ICD-10-CM | POA: Diagnosis not present

## 2022-08-16 DIAGNOSIS — I5043 Acute on chronic combined systolic (congestive) and diastolic (congestive) heart failure: Secondary | ICD-10-CM | POA: Diagnosis not present

## 2022-08-16 DIAGNOSIS — T24231D Burn of second degree of right lower leg, subsequent encounter: Secondary | ICD-10-CM | POA: Diagnosis not present

## 2022-08-16 DIAGNOSIS — E1165 Type 2 diabetes mellitus with hyperglycemia: Secondary | ICD-10-CM | POA: Diagnosis not present

## 2022-08-16 DIAGNOSIS — E1122 Type 2 diabetes mellitus with diabetic chronic kidney disease: Secondary | ICD-10-CM | POA: Diagnosis not present

## 2022-08-16 NOTE — Patient Instructions (Signed)
Visit Information  Thank you for taking time to visit with me today. Please don't hesitate to contact me if I can be of assistance to you.   Following are the goals we discussed today:   Goals Addressed               This Visit's Progress     Receive Assistance Applying for [place]. (pt-stated)   On track     Care Coordination Interventions:  Interventions Today    Flowsheet Row Most Recent Value  Chronic Disease   Chronic disease during today's visit Diabetes, Hypertension (HTN), Congestive Heart Failure (CHF), Chronic Kidney Disease/End Stage Renal Disease (ESRD), Other  [Major Depression, Single Episde, Severe]  General Interventions   General Interventions Discussed/Reviewed General Interventions Discussed, General Interventions Reviewed, Annual Eye Exam, Labs, Annual Foot Exam, Lipid Profile, Durable Medical Equipment (DME), Vaccines, Health Screening, [employer], Doctor Visits, Communication with, Level of Care  [facility] Care Provider & Representative with [facility]]  Labs Hgb A1c every 3 months, Kidney Function  Vaccines COVID-19, Flu, Pneumonia, RSV, Shingles  [Encouraged]  Doctor Visits Discussed/Reviewed Doctor Visits Discussed, Doctor Visits Reviewed, Annual Wellness Visits, PCP, Specialist  Health Screening Colonoscopy, Prostate  Durable Medical Equipment (DME) BP Cuff, Glucomoter, Insulin Pump, Oxygen, Walker, Community education officer  PCP/Specialist Visits Compliance with follow-up visit  Communication with PCP/Specialists  Level of Care Applications, Assisted Living, [place]  Exercise Interventions   Exercise Discussed/Reviewed Exercise Discussed, Exercise Reviewed, Physical Activity, Weight Managment, Assistive device use and maintanence  Physical Activity Discussed/Reviewed Physical Activity Discussed, Physical Activity Reviewed, Types of exercise   [Encouraged]  Weight Management Weight maintenance  Education Interventions   Education Provided Provided Engineer, site, Provided Education  Provided Verbal Education On Nutrition, Foot Care, Eye Care, Labs, Blood Sugar Monitoring, Mental Health/Coping with Illness, Applications, Exercise, Medication, When to see the doctor, [place] Discussed, Mental Health Reviewed, Coping Strategies, Crisis, Anxiety, Depression, Suicide  Nutrition Interventions   Nutrition Discussed/Reviewed Nutrition Discussed, Nutrition Reviewed, Adding fruits and vegetables, Fluid intake, Portion sizes, Decreasing sugar intake, Decreasing salt, Decreasing fats, Increaing proteins  Pharmacy Interventions   Pharmacy Dicussed/Reviewed Pharmacy Topics Discussed, Pharmacy Topics Reviewed, Medication Adherence, Affording Medications  Safety Interventions   Safety Discussed/Reviewed Safety Discussed, Safety Reviewed, Fall Risk, Home Safety  Home Herbalist, Refer for community resources, Contact home health agency  Advanced Directive Interventions   Advanced Directives Discussed/Reviewed Advanced Directives Discussed, Advanced Directives Reviewed     Active listening & reflection utilized.  Verbalization of feelings encouraged. Emotional support provided. Feelings of frustration regarding loss of independence validated. Caregiver stress & burnout acknowledged. Caregiver resources reviewed. Caregiver support groups provided. Self-enrollment in caregiver support group of interest emphasized. Problem solving interventions indicated. Task-centered strategies employed. Solution-focused activities activated. Cognitive Behavioral Therapy initiated. Acceptance & Commitment Therapy implemented. Client-Centered Therapy performed. CSW collaboration with Primary Care  Provider, Dr. [name] with [facility] [phone]# [phone]), via [facility] Message, to request review & signature of [place] application, faxed to [place] [phone]), on 08/16/2022. CSW collaboration with Primary Care Provider, Dr. [name] with [place] [phone]# [phone]), via [name], to request completed & signed [place] application be faxed to [place] (# [phone]), for processing. CSW collaboration  with receptionist at [place] [phone]), to confirm receipt of [place] application & encourage fax submission to [name] [phone]), for processing, once obtained from Primary Care Provider, Dr. [name]. Please accept all calls from representative with [place] (# [phone]), in an effort to [place] in the home. Please keep [place] Provider list, until approved for [place], through [facility] [phone]). Please decide on at least 3 agencies of interest, from the list of [place] Providers & be prepared to provide these agencies to representative with [place] [phone]), once approved for [place]. Please continue to consider utilizing one or more of the following transportation agencies, all free of charge to [place] Adult Medicaid recipients:   ~ [place] [phone]) ~ "[employer], music [phone]) ~ Medicaid Transportation (# [phone]) ~ [place] (# [id number]) ~ [place] (# [phone]) Please continue to participate in home health services, through [facility] [phone]# [phone]).      Our  next appointment is by telephone on 08/31/2022 at 9:00 am.  Please call the care guide team at [phone] if you need to cancel or reschedule your appointment.   If you are experiencing a Mental Health or [place] or need someone to talk to, please call the Suicide and Crisis Lifeline: 988 call the [place] National Suicide Prevention Lifeline: (615)268-4464 or TTY: 519-742-2705 TTY (501)842-6633) to talk to a trained counselor call 1-800-273-TALK (toll free, 24 hour hotline) go to [facility] [address] [place] [phone]) call the [place]: [phone] call 911  Patient verbalizes understanding of instructions and care plan provided today and agrees to view in [place]. Active MyChart status and patient understanding of how to access instructions and care plan via MyChart confirmed with patient.     Telephone follow up appointment with care management team member scheduled for:  08/31/2022 at 9:00 am.  [name], BSW, MSW, [place]  Licensed Clinical Social Worker  [place]  Mailing [place]. [address] [place], [place] [address] Physical Address-[address], [place], [place] [address] Toll Free Main # [phone] Fax # [phone] Cell # [phone] [email or web address]

## 2022-08-16 NOTE — Patient Outreach (Signed)
Care Coordination   Follow Up Visit Note   08/16/2022  Name: [name] MRN: [id number] DOB: [birth year]  [name] is a 67 y.o. year old male who sees [name], [name], MD for primary care. I spoke with [name] by phone today.  What matters to the patients health and wellness today?   Receive Assistance Applying for [place].   Goals Addressed               This Visit's Progress     Receive Assistance Applying for [place]. (pt-stated)   On track     Care Coordination Interventions:  Interventions Today    Flowsheet Row Most Recent Value  Chronic Disease   Chronic disease during today's visit Diabetes, Hypertension (HTN), Congestive Heart Failure (CHF), Chronic Kidney Disease/End Stage Renal Disease (ESRD), Other  [Major Depression, Single Episde, Severe]  General Interventions   General Interventions Discussed/Reviewed General Interventions Discussed, General Interventions Reviewed, Annual Eye Exam, Labs, Annual Foot Exam, Lipid Profile, Durable Medical Equipment (DME), Vaccines, Health Screening, [employer], Doctor Visits, Communication with, Level of Care  [name] Care Provider & Representative with [facility]]  Labs Hgb A1c every 3 months, Kidney Function  Vaccines COVID-19, Flu, Pneumonia, RSV, Shingles  [Encouraged]  Doctor Visits Discussed/Reviewed Doctor Visits Discussed, Doctor Visits Reviewed, Annual Wellness Visits, PCP, Specialist  Health Screening Colonoscopy, Prostate  Durable Medical Equipment (DME) BP Cuff, Glucomoter, Insulin Pump, Oxygen, Walker, Community education officer  PCP/Specialist Visits Compliance with follow-up visit  Communication with PCP/Specialists  Level of Care Applications, Assisted Living, [place]  Exercise Interventions   Exercise Discussed/Reviewed Exercise Discussed, Exercise Reviewed,  Physical Activity, Weight Managment, Assistive device use and maintanence  Physical Activity Discussed/Reviewed Physical Activity Discussed, Physical Activity Reviewed, Types of exercise  [Encouraged]  Weight Management Weight maintenance  Education Interventions   Education Provided Provided Engineer, site, Provided Education  Provided Verbal Education On Nutrition, Foot Care, Eye Care, Labs, Blood Sugar Monitoring, Mental Health/Coping with Illness, Applications, Exercise, Medication, When to see the doctor, [name] Discussed, Mental Health Reviewed, Coping Strategies, Crisis, Anxiety, Depression, Suicide  Nutrition Interventions   Nutrition Discussed/Reviewed Nutrition Discussed, Nutrition Reviewed, Adding fruits and vegetables, Fluid intake, Portion sizes, Decreasing sugar intake, Decreasing salt, Decreasing fats, Increaing proteins  Pharmacy Interventions   Pharmacy Dicussed/Reviewed Pharmacy Topics Discussed, Pharmacy Topics Reviewed, Medication Adherence, Affording Medications  Safety Interventions   Safety Discussed/Reviewed Safety Discussed, Safety Reviewed, Fall Risk, Home Safety  Home Herbalist, Refer for community resources, Contact home health agency  Advanced Directive Interventions   Advanced Directives Discussed/Reviewed Advanced Directives Discussed, Advanced Directives Reviewed     Active listening & reflection utilized.  Verbalization of feelings encouraged. Emotional support provided. Feelings of frustration regarding loss of independence validated. Caregiver stress & burnout acknowledged. Caregiver resources reviewed. Caregiver support groups provided. Self-enrollment in caregiver support group of interest emphasized. Problem solving interventions indicated. Task-centered strategies  employed. Solution-focused activities activated. Cognitive Behavioral Therapy initiated. Acceptance & Commitment Therapy implemented. Client-Centered Therapy performed. CSW collaboration with Primary Care Provider, [name] with [name], via [facility] Message, to request review & signature of [name], faxed to [name] [phone]), on 08/16/2022. CSW collaboration with Primary Care Provider, [name]  [name] with [name] [phone]), via [name], to request completed & signed [name] be faxed to [name], for processing. CSW collaboration with receptionist at [name] [phone]), to confirm receipt of [place] application & encourage fax submission to [name] [phone]), for processing, once obtained from Primary Care Provider, [name]. Please accept all calls from representative with [place] (# [phone]), in an effort to [place] in the home. Please keep [place] Provider list, until approved for [place], through [facility] [phone]). Please decide on at least 3 agencies of interest, from the list of [place] Providers & be prepared to provide these agencies to representative with [place] [phone]), once approved for [place]. Please continue to consider utilizing one or more of the following transportation agencies, all free of charge to [place] Adult Medicaid recipients:   ~ [place] [phone]) ~ "[employer], music [phone]) ~ Medicaid Transportation (# [phone]) ~ [place] (# [id number]) ~ [place] (# [phone]) Please continue to participate in home health services, through [facility] [phone]# [phone]).      [name] assessments and interventions completed:  Yes.  Care Coordination Interventions:  Yes, provided.   Follow up plan: Follow up call scheduled for 08/31/2022 at 9:00 am.  Encounter Outcome:  Pt. Visit Completed.   [name], BSW, MSW, LCSW  Licensed Education officer, [facility]  Mailing [place] [address] [place], [place] [address] Physical Address-[address] [place], [place] [address] Toll Free Main # [phone] Fax # [phone] Cell # [phone] [email or web address]

## 2022-08-16 NOTE — Telephone Encounter (Signed)
Nurses There are multiple levels for this issue that was brought up I did communicate with the social service worker to find out where in the process his application was regarding having a Psychologist, sport and exercise. She looked into this and stated that the original application could not be found (the prior application was sent in but apparently it is nowhere to be found and so therefore his situation has not progressed any in regards to getting an assistant.) Social worker was faxing another form to our office today for me to work on (again) Please forward this form to me ASAP  Also I found out from the social worker that his insurance will not pay for a scooter according to the Education officer, museum.  Stated that the state services will not pay for a scooter if they are doing a personal care assistant.  Essentially has to be 1 or the other.  In my opinion a personal care assistant is more important than the scooter.  Social worker did state that family can buy a scooter out of their pocket if they wish to.  This is a very complex situation.  I am sympathetic to what is going on.  Unfortunately multiple layers of red tape reflective of a complex medical system [employer], government, etc.) make this a difficult situation to get done.  Hopefully with resubmitting the form this will get a personal care assistant for [place].  We are more than willing to do what ever we can to try to help.  Nurses-you may forward a copy of this message to family (Amy) so she will be aware of what is going on.  Cassie Freer MD

## 2022-08-16 NOTE — Telephone Encounter (Signed)
Please go ahead with verbal orders.

## 2022-08-16 NOTE — Telephone Encounter (Signed)
[name]-with home health needs verbal order for extended home health OT once a week three weeks also prescription for beside commode. [name]-[phone].

## 2022-08-17 DIAGNOSIS — Z992 Dependence on renal dialysis: Secondary | ICD-10-CM | POA: Diagnosis not present

## 2022-08-17 DIAGNOSIS — N2581 Secondary hyperparathyroidism of renal origin: Secondary | ICD-10-CM | POA: Diagnosis not present

## 2022-08-17 DIAGNOSIS — N186 End stage renal disease: Secondary | ICD-10-CM | POA: Diagnosis not present

## 2022-08-17 NOTE — Telephone Encounter (Signed)
Verbal orders given to [place]

## 2022-08-18 ENCOUNTER — Emergency Department (HOSPITAL_COMMUNITY): Payer: Medicare HMO

## 2022-08-18 ENCOUNTER — Telehealth: Payer: Self-pay | Admitting: Family Medicine

## 2022-08-18 ENCOUNTER — Inpatient Hospital Stay (HOSPITAL_COMMUNITY)
Admission: EM | Admit: 2022-08-18 | Discharge: 2022-08-21 | DRG: 308 | Disposition: A | Discharge status: Home Health | Payer: Medicare HMO | Attending: Internal Medicine | Admitting: Internal Medicine

## 2022-08-18 ENCOUNTER — Other Ambulatory Visit (HOSPITAL_COMMUNITY): Payer: Self-pay | Admitting: *Deleted

## 2022-08-18 ENCOUNTER — Other Ambulatory Visit: Payer: Self-pay

## 2022-08-18 ENCOUNTER — Encounter (HOSPITAL_COMMUNITY): Payer: Self-pay

## 2022-08-18 ENCOUNTER — Inpatient Hospital Stay (HOSPITAL_COMMUNITY): Payer: Medicare HMO

## 2022-08-18 DIAGNOSIS — K2950 Unspecified chronic gastritis without bleeding: Secondary | ICD-10-CM | POA: Diagnosis present

## 2022-08-18 DIAGNOSIS — I4719 Other supraventricular tachycardia: Secondary | ICD-10-CM | POA: Diagnosis present

## 2022-08-18 DIAGNOSIS — N2581 Secondary hyperparathyroidism of renal origin: Secondary | ICD-10-CM | POA: Diagnosis present

## 2022-08-18 DIAGNOSIS — R109 Unspecified abdominal pain: Secondary | ICD-10-CM | POA: Diagnosis not present

## 2022-08-18 DIAGNOSIS — E7849 Other hyperlipidemia: Secondary | ICD-10-CM | POA: Diagnosis not present

## 2022-08-18 DIAGNOSIS — D62 Acute posthemorrhagic anemia: Secondary | ICD-10-CM | POA: Diagnosis present

## 2022-08-18 DIAGNOSIS — I471 Supraventricular tachycardia, unspecified: Principal | ICD-10-CM | POA: Diagnosis present

## 2022-08-18 DIAGNOSIS — D649 Anemia, unspecified: Secondary | ICD-10-CM | POA: Diagnosis not present

## 2022-08-18 DIAGNOSIS — E1122 Type 2 diabetes mellitus with diabetic chronic kidney disease: Secondary | ICD-10-CM | POA: Diagnosis present

## 2022-08-18 DIAGNOSIS — Z8249 Family history of ischemic heart disease and other diseases of the circulatory system: Secondary | ICD-10-CM | POA: Diagnosis not present

## 2022-08-18 DIAGNOSIS — F32A Depression, unspecified: Secondary | ICD-10-CM | POA: Diagnosis present

## 2022-08-18 DIAGNOSIS — R079 Chest pain, unspecified: Secondary | ICD-10-CM

## 2022-08-18 DIAGNOSIS — E8770 Fluid overload, unspecified: Secondary | ICD-10-CM

## 2022-08-18 DIAGNOSIS — Z91030 Bee allergy status: Secondary | ICD-10-CM

## 2022-08-18 DIAGNOSIS — Z789 Other specified health status: Secondary | ICD-10-CM | POA: Diagnosis not present

## 2022-08-18 DIAGNOSIS — R112 Nausea with vomiting, unspecified: Secondary | ICD-10-CM | POA: Diagnosis not present

## 2022-08-18 DIAGNOSIS — Z79899 Other long term (current) drug therapy: Secondary | ICD-10-CM | POA: Diagnosis not present

## 2022-08-18 DIAGNOSIS — R846 Abnormal cytological findings in specimens from respiratory organs and thorax: Secondary | ICD-10-CM | POA: Diagnosis not present

## 2022-08-18 DIAGNOSIS — Z992 Dependence on renal dialysis: Secondary | ICD-10-CM | POA: Diagnosis not present

## 2022-08-18 DIAGNOSIS — I132 Hypertensive heart and chronic kidney disease with heart failure and with stage 5 chronic kidney disease, or end stage renal disease: Secondary | ICD-10-CM | POA: Diagnosis not present

## 2022-08-18 DIAGNOSIS — G4733 Obstructive sleep apnea (adult) (pediatric): Secondary | ICD-10-CM | POA: Diagnosis present

## 2022-08-18 DIAGNOSIS — J9811 Atelectasis: Secondary | ICD-10-CM | POA: Diagnosis present

## 2022-08-18 DIAGNOSIS — N186 End stage renal disease: Secondary | ICD-10-CM | POA: Diagnosis present

## 2022-08-18 DIAGNOSIS — I447 Left bundle-branch block, unspecified: Secondary | ICD-10-CM | POA: Diagnosis not present

## 2022-08-18 DIAGNOSIS — R0902 Hypoxemia: Secondary | ICD-10-CM | POA: Diagnosis not present

## 2022-08-18 DIAGNOSIS — E8889 Other specified metabolic disorders: Secondary | ICD-10-CM | POA: Diagnosis present

## 2022-08-18 DIAGNOSIS — M19011 Primary osteoarthritis, right shoulder: Secondary | ICD-10-CM | POA: Diagnosis present

## 2022-08-18 DIAGNOSIS — J9621 Acute and chronic respiratory failure with hypoxia: Secondary | ICD-10-CM | POA: Diagnosis not present

## 2022-08-18 DIAGNOSIS — M19012 Primary osteoarthritis, left shoulder: Secondary | ICD-10-CM | POA: Diagnosis present

## 2022-08-18 DIAGNOSIS — R0789 Other chest pain: Secondary | ICD-10-CM | POA: Diagnosis not present

## 2022-08-18 DIAGNOSIS — I252 Old myocardial infarction: Secondary | ICD-10-CM | POA: Diagnosis not present

## 2022-08-18 DIAGNOSIS — Z7982 Long term (current) use of aspirin: Secondary | ICD-10-CM

## 2022-08-18 DIAGNOSIS — E785 Hyperlipidemia, unspecified: Secondary | ICD-10-CM | POA: Diagnosis present

## 2022-08-18 DIAGNOSIS — Z794 Long term (current) use of insulin: Secondary | ICD-10-CM | POA: Diagnosis not present

## 2022-08-18 DIAGNOSIS — D631 Anemia in chronic kidney disease: Secondary | ICD-10-CM | POA: Diagnosis not present

## 2022-08-18 DIAGNOSIS — K449 Diaphragmatic hernia without obstruction or gangrene: Secondary | ICD-10-CM | POA: Diagnosis present

## 2022-08-18 DIAGNOSIS — W19XXXA Unspecified fall, initial encounter: Secondary | ICD-10-CM | POA: Diagnosis present

## 2022-08-18 DIAGNOSIS — N250 Renal osteodystrophy: Secondary | ICD-10-CM | POA: Diagnosis not present

## 2022-08-18 DIAGNOSIS — Z800 Family history of malignant neoplasm of digestive organs: Secondary | ICD-10-CM

## 2022-08-18 DIAGNOSIS — M160 Bilateral primary osteoarthritis of hip: Secondary | ICD-10-CM | POA: Diagnosis present

## 2022-08-18 DIAGNOSIS — K2100 Gastro-esophageal reflux disease with esophagitis, without bleeding: Secondary | ICD-10-CM | POA: Diagnosis present

## 2022-08-18 DIAGNOSIS — R0602 Shortness of breath: Secondary | ICD-10-CM | POA: Diagnosis not present

## 2022-08-18 DIAGNOSIS — Z86010 Personal history of colonic polyps: Secondary | ICD-10-CM

## 2022-08-18 DIAGNOSIS — Z8673 Personal history of transient ischemic attack (TIA), and cerebral infarction without residual deficits: Secondary | ICD-10-CM

## 2022-08-18 DIAGNOSIS — I3139 Other pericardial effusion (noninflammatory): Secondary | ICD-10-CM | POA: Diagnosis not present

## 2022-08-18 DIAGNOSIS — I5042 Chronic combined systolic (congestive) and diastolic (congestive) heart failure: Secondary | ICD-10-CM | POA: Diagnosis not present

## 2022-08-18 DIAGNOSIS — Z87891 Personal history of nicotine dependence: Secondary | ICD-10-CM | POA: Diagnosis not present

## 2022-08-18 DIAGNOSIS — I959 Hypotension, unspecified: Secondary | ICD-10-CM | POA: Diagnosis present

## 2022-08-18 DIAGNOSIS — J90 Pleural effusion, not elsewhere classified: Secondary | ICD-10-CM | POA: Diagnosis not present

## 2022-08-18 DIAGNOSIS — Z951 Presence of aortocoronary bypass graft: Secondary | ICD-10-CM

## 2022-08-18 DIAGNOSIS — J9601 Acute respiratory failure with hypoxia: Secondary | ICD-10-CM | POA: Diagnosis not present

## 2022-08-18 DIAGNOSIS — H5461 Unqualified visual loss, right eye, normal vision left eye: Secondary | ICD-10-CM | POA: Diagnosis present

## 2022-08-18 DIAGNOSIS — C159 Malignant neoplasm of esophagus, unspecified: Secondary | ICD-10-CM | POA: Diagnosis not present

## 2022-08-18 DIAGNOSIS — S2232XA Fracture of one rib, left side, initial encounter for closed fracture: Secondary | ICD-10-CM | POA: Diagnosis present

## 2022-08-18 DIAGNOSIS — Z888 Allergy status to other drugs, medicaments and biological substances status: Secondary | ICD-10-CM

## 2022-08-18 DIAGNOSIS — R739 Hyperglycemia, unspecified: Secondary | ICD-10-CM | POA: Diagnosis not present

## 2022-08-18 DIAGNOSIS — I700 Atherosclerosis of aorta: Secondary | ICD-10-CM | POA: Diagnosis not present

## 2022-08-18 DIAGNOSIS — E1165 Type 2 diabetes mellitus with hyperglycemia: Secondary | ICD-10-CM | POA: Diagnosis not present

## 2022-08-18 DIAGNOSIS — Z8719 Personal history of other diseases of the digestive system: Secondary | ICD-10-CM

## 2022-08-18 DIAGNOSIS — I25810 Atherosclerosis of coronary artery bypass graft(s) without angina pectoris: Secondary | ICD-10-CM | POA: Diagnosis not present

## 2022-08-18 DIAGNOSIS — R2689 Other abnormalities of gait and mobility: Secondary | ICD-10-CM | POA: Diagnosis present

## 2022-08-18 DIAGNOSIS — Z91119 Patient's noncompliance with dietary regimen due to unspecified reason: Secondary | ICD-10-CM

## 2022-08-18 DIAGNOSIS — I2510 Atherosclerotic heart disease of native coronary artery without angina pectoris: Secondary | ICD-10-CM | POA: Diagnosis present

## 2022-08-18 DIAGNOSIS — R1110 Vomiting, unspecified: Secondary | ICD-10-CM | POA: Diagnosis not present

## 2022-08-18 DIAGNOSIS — Z833 Family history of diabetes mellitus: Secondary | ICD-10-CM

## 2022-08-18 LAB — TROPONIN I (HIGH SENSITIVITY)
Troponin I (High Sensitivity): 23 ng/L — ABNORMAL HIGH (ref <18)
Troponin I (High Sensitivity): 23 ng/L — ABNORMAL HIGH (ref <18)
Troponin I (High Sensitivity): 20 ng/L — ABNORMAL HIGH (ref <18)

## 2022-08-18 LAB — ECHOCARDIOGRAM COMPLETE
AR max vel: 1.97 cm2
AV Area VTI: 2.29 cm2
AV Area mean vel: 2.22 cm2
AV Mean grad: 4.7 mmHg
AV Peak grad: 10.8 mmHg
Ao pk vel: 1.65 m/s
Area-P 1/2: 4.68 cm2
Est EF: 50
Height: 70 in
MV VTI: 2.15 cm2
S' Lateral: 4.3 cm
Weight: 3184.01 oz

## 2022-08-18 LAB — COMPREHENSIVE METABOLIC PANEL
ALT: 8 U/L (ref 0–44)
AST: 12 U/L — ABNORMAL LOW (ref 15–41)
Albumin: 3.2 g/dL — ABNORMAL LOW (ref 3.5–5.0)
Alkaline Phosphatase: 113 U/L (ref 38–126)
Anion gap: 12 (ref 5–15)
BUN: 25 mg/dL — ABNORMAL HIGH (ref 8–23)
CO2: 29 mmol/L (ref 22–32)
Calcium: 10.1 mg/dL (ref 8.9–10.3)
Chloride: 92 mmol/L — ABNORMAL LOW (ref 98–111)
Creatinine, Ser: 3.53 mg/dL — ABNORMAL HIGH (ref 0.61–1.24)
GFR, Estimated: 18 mL/min — ABNORMAL LOW (ref >60)
Glucose, Bld: 321 mg/dL — ABNORMAL HIGH (ref 70–99)
Potassium: 3.6 mmol/L (ref 3.5–5.1)
Sodium: 133 mmol/L — ABNORMAL LOW (ref 135–145)
Total Bilirubin: 1.1 mg/dL (ref 0.3–1.2)
Total Protein: 6.3 g/dL — ABNORMAL LOW (ref 6.5–8.1)

## 2022-08-18 LAB — LACTIC ACID, PLASMA: Lactic Acid, Venous: 1.6 mmol/L (ref 0.5–1.9)

## 2022-08-18 LAB — BODY FLUID CELL COUNT WITH DIFFERENTIAL
Eos, Fluid: 1 %
Lymphs, Fluid: 14 %
Monocyte-Macrophage-Serous Fluid: 84 % (ref 50–90)
Neutrophil Count, Fluid: 1 % (ref 0–25)
Total Nucleated Cell Count, Fluid: 263 cu mm (ref 0–1000)

## 2022-08-18 LAB — PROTEIN, PLEURAL OR PERITONEAL FLUID: Total protein, fluid: 3.1 g/dL

## 2022-08-18 LAB — APTT: aPTT: 37 seconds — ABNORMAL HIGH (ref 24–36)

## 2022-08-18 LAB — SAMPLE TO BLOOD BANK

## 2022-08-18 LAB — PROTIME-INR
INR: 1.1 (ref 0.8–1.2)
Prothrombin Time: 14.3 seconds (ref 11.4–15.2)

## 2022-08-18 LAB — CBG MONITORING, ED: Glucose-Capillary: 318 mg/dL — ABNORMAL HIGH (ref 70–99)

## 2022-08-18 LAB — MAGNESIUM: Magnesium: 3.1 mg/dL — ABNORMAL HIGH (ref 1.7–2.4)

## 2022-08-18 LAB — CBC
HCT: 28 % — ABNORMAL LOW (ref 39.0–52.0)
Hemoglobin: 8.6 g/dL — ABNORMAL LOW (ref 13.0–17.0)
MCH: 27.7 pg (ref 26.0–34.0)
MCHC: 30.7 g/dL (ref 30.0–36.0)
MCV: 90 fL (ref 80.0–100.0)
Platelets: 316 10*3/uL (ref 150–400)
RBC: 3.11 MIL/uL — ABNORMAL LOW (ref 4.22–5.81)
RDW: 20.6 % — ABNORMAL HIGH (ref 11.5–15.5)
WBC: 10.5 10*3/uL (ref 4.0–10.5)
nRBC: 0 % (ref 0.0–0.2)

## 2022-08-18 LAB — BRAIN NATRIURETIC PEPTIDE: B Natriuretic Peptide: 3202 pg/mL — ABNORMAL HIGH (ref 0.0–100.0)

## 2022-08-18 LAB — LIPASE, BLOOD: Lipase: 52 U/L — ABNORMAL HIGH (ref 11–51)

## 2022-08-18 LAB — GRAM STAIN

## 2022-08-18 LAB — LACTATE DEHYDROGENASE, PLEURAL OR PERITONEAL FLUID: LD, Fluid: 96 U/L — ABNORMAL HIGH (ref 3–23)

## 2022-08-18 LAB — GLUCOSE, CAPILLARY
Glucose-Capillary: 262 mg/dL — ABNORMAL HIGH (ref 70–99)
Glucose-Capillary: 178 mg/dL — ABNORMAL HIGH (ref 70–99)

## 2022-08-18 LAB — D-DIMER, QUANTITATIVE: D-Dimer, Quant: 2.83 ug/mL-FEU — ABNORMAL HIGH (ref 0.00–0.50)

## 2022-08-18 LAB — PROCALCITONIN: Procalcitonin: 0.4 ng/mL

## 2022-08-18 MED ORDER — PERFLUTREN LIPID MICROSPHERE
1.0000 mL | INTRAVENOUS | Status: AC | PRN
  Administered 2022-08-18: 3 mL via INTRAVENOUS

## 2022-08-18 MED ORDER — INSULIN ASPART 100 UNIT/ML IJ SOLN
0.0000 [IU] | Freq: Three times a day (TID) | INTRAMUSCULAR | Status: DC
  Administered 2022-08-18 – 2022-08-19 (×2): 3 [IU] via SUBCUTANEOUS
  Administered 2022-08-19: 2 [IU] via SUBCUTANEOUS
  Administered 2022-08-19 – 2022-08-20 (×2): 1 [IU] via SUBCUTANEOUS
  Administered 2022-08-21: 3 [IU] via SUBCUTANEOUS
  Administered 2022-08-21: 1 [IU] via SUBCUTANEOUS

## 2022-08-18 MED ORDER — ONDANSETRON HCL 4 MG PO TABS
4.0000 mg | ORAL_TABLET | Freq: Four times a day (QID) | ORAL | Status: DC | PRN
  Administered 2022-08-20: 4 mg via ORAL
  Filled 2022-08-18: qty 1

## 2022-08-18 MED ORDER — CALCIUM GLUCONATE 10 % IV SOLN
INTRAVENOUS | Status: AC
  Filled 2022-08-18: qty 10

## 2022-08-18 MED ORDER — ASPIRIN 81 MG PO CHEW
162.0000 mg | CHEWABLE_TABLET | ORAL | Status: AC
  Administered 2022-08-18: 162 mg via ORAL
  Filled 2022-08-18: qty 2

## 2022-08-18 MED ORDER — AMIODARONE HCL IN DEXTROSE 360-4.14 MG/200ML-% IV SOLN
60.0000 mg/h | INTRAVENOUS | Status: AC
  Administered 2022-08-18: 60 mg/h via INTRAVENOUS
  Filled 2022-08-18: qty 200

## 2022-08-18 MED ORDER — GUAIFENESIN-DM 100-10 MG/5ML PO SYRP
5.0000 mL | ORAL_SOLUTION | ORAL | Status: DC | PRN
  Administered 2022-08-18 – 2022-08-20 (×4): 5 mL via ORAL
  Filled 2022-08-18 (×4): qty 5

## 2022-08-18 MED ORDER — EPINEPHRINE HCL 5 MG/250ML IV SOLN IN NS
INTRAVENOUS | Status: AC
  Filled 2022-08-18: qty 250

## 2022-08-18 MED ORDER — POTASSIUM CHLORIDE CRYS ER 20 MEQ PO TBCR
40.0000 meq | EXTENDED_RELEASE_TABLET | Freq: Once | ORAL | Status: AC
  Administered 2022-08-18: 40 meq via ORAL
  Filled 2022-08-18: qty 2

## 2022-08-18 MED ORDER — NOREPINEPHRINE 4 MG/250ML-% IV SOLN
0.0000 ug/min | INTRAVENOUS | Status: DC
  Administered 2022-08-18: 2 ug/min via INTRAVENOUS
  Filled 2022-08-18: qty 250

## 2022-08-18 MED ORDER — NITROGLYCERIN 0.4 MG SL SUBL
0.4000 mg | SUBLINGUAL_TABLET | Freq: Once | SUBLINGUAL | Status: AC
  Administered 2022-08-18: 0.4 mg via SUBLINGUAL
  Filled 2022-08-18: qty 1

## 2022-08-18 MED ORDER — SODIUM CHLORIDE 0.9 % IV BOLUS (SEPSIS)
1000.0000 mL | Freq: Once | INTRAVENOUS | Status: AC
  Administered 2022-08-18: 1000 mL via INTRAVENOUS

## 2022-08-18 MED ORDER — ONDANSETRON HCL 4 MG/2ML IJ SOLN
4.0000 mg | Freq: Once | INTRAMUSCULAR | Status: AC
  Administered 2022-08-18: 4 mg via INTRAVENOUS
  Filled 2022-08-18: qty 2

## 2022-08-18 MED ORDER — METOPROLOL SUCCINATE ER 50 MG PO TB24
50.0000 mg | ORAL_TABLET | Freq: Every day | ORAL | Status: DC
  Administered 2022-08-19 – 2022-08-21 (×3): 50 mg via ORAL
  Filled 2022-08-18 (×3): qty 1

## 2022-08-18 MED ORDER — IOHEXOL 350 MG/ML SOLN
100.0000 mL | Freq: Once | INTRAVENOUS | Status: AC | PRN
  Administered 2022-08-18: 100 mL via INTRAVENOUS

## 2022-08-18 MED ORDER — CHLORHEXIDINE GLUCONATE CLOTH 2 % EX PADS
6.0000 | MEDICATED_PAD | Freq: Every day | CUTANEOUS | Status: DC
  Administered 2022-08-19 – 2022-08-21 (×3): 6 via TOPICAL

## 2022-08-18 MED ORDER — PANTOPRAZOLE SODIUM 40 MG PO TBEC
40.0000 mg | DELAYED_RELEASE_TABLET | Freq: Two times a day (BID) | ORAL | Status: DC
  Administered 2022-08-18 – 2022-08-21 (×6): 40 mg via ORAL
  Filled 2022-08-18 (×6): qty 1

## 2022-08-18 MED ORDER — SERTRALINE HCL 50 MG PO TABS
50.0000 mg | ORAL_TABLET | Freq: Every day | ORAL | Status: DC
  Administered 2022-08-19 – 2022-08-21 (×3): 50 mg via ORAL
  Filled 2022-08-18 (×3): qty 1

## 2022-08-18 MED ORDER — AMIODARONE HCL IN DEXTROSE 360-4.14 MG/200ML-% IV SOLN
30.0000 mg/h | INTRAVENOUS | Status: DC
  Administered 2022-08-18 – 2022-08-19 (×2): 30 mg/h via INTRAVENOUS
  Filled 2022-08-18 (×3): qty 200

## 2022-08-18 MED ORDER — AMIODARONE IV BOLUS ONLY 150 MG/100ML
INTRAVENOUS | Status: AC
  Filled 2022-08-18: qty 100

## 2022-08-18 MED ORDER — LIDOCAINE HCL (PF) 2 % IJ SOLN
INTRAMUSCULAR | Status: AC
  Filled 2022-08-18: qty 10

## 2022-08-18 MED ORDER — CALCIUM ACETATE (PHOS BINDER) 667 MG PO CAPS
667.0000 mg | ORAL_CAPSULE | Freq: Three times a day (TID) | ORAL | Status: DC
  Administered 2022-08-18 – 2022-08-21 (×8): 667 mg via ORAL
  Filled 2022-08-18 (×8): qty 1

## 2022-08-18 MED ORDER — AMIODARONE LOAD VIA INFUSION
150.0000 mg | Freq: Once | INTRAVENOUS | Status: AC
  Administered 2022-08-18: 150 mg via INTRAVENOUS
  Filled 2022-08-18: qty 83.34

## 2022-08-18 MED ORDER — ETOMIDATE 2 MG/ML IV SOLN
INTRAVENOUS | Status: AC
  Administered 2022-08-18: 20 mg
  Filled 2022-08-18: qty 10

## 2022-08-18 MED ORDER — ONDANSETRON HCL 4 MG/2ML IJ SOLN: 4.0000 mg | Freq: Four times a day (QID) | INTRAMUSCULAR | Status: DC | PRN

## 2022-08-18 MED ORDER — ETOMIDATE 2 MG/ML IV SOLN: 10.0000 mg | Freq: Once | INTRAVENOUS | Status: DC

## 2022-08-18 MED ORDER — ACETAMINOPHEN 325 MG PO TABS: 650.0000 mg | ORAL_TABLET | Freq: Four times a day (QID) | ORAL | Status: DC | PRN

## 2022-08-18 MED ORDER — ACETAMINOPHEN 650 MG RE SUPP: 650.0000 mg | Freq: Four times a day (QID) | RECTAL | Status: DC | PRN

## 2022-08-18 MED ORDER — INSULIN ASPART 100 UNIT/ML IJ SOLN
0.0000 [IU] | Freq: Every day | INTRAMUSCULAR | Status: DC
  Administered 2022-08-20: 2 [IU] via SUBCUTANEOUS

## 2022-08-18 NOTE — ED Notes (Signed)
Pt has purple ecchymosis across lower abdomen he states is from insulin injections.

## 2022-08-18 NOTE — Telephone Encounter (Signed)
This form was just brought in given to [place].  Also patient ended up having to go to the ER earlier today and was admitted.  So therefore no need to call the patient.  We will connect with him when he is released.

## 2022-08-18 NOTE — Progress Notes (Signed)
Right sided thoracentesis complete at this at bedside in room ICU by Soyla Dryer, NP. Patient tolerated procedure well and 1200 mL of pleural fluid removed with labs collected and sent to lab for processing. Patient verbalized understanding of post procedure instructions, patient repositioned back into a lying position in bed and has no acute distress noted at completion of procedure. Portable chest xray ordered.

## 2022-08-18 NOTE — Progress Notes (Signed)
*  PRELIMINARY RESULTS* Echocardiogram 2D Echocardiogram has been performed with [facility].  [name] 08/18/2022, 10:52 AM

## 2022-08-18 NOTE — ED Notes (Signed)
X-ray at bedside

## 2022-08-18 NOTE — ED Triage Notes (Signed)
Pt called EMS after waking up with substernal CP since around 0630. Pt rated pain at time as 8/10.  Pt had dialysis yesterday for 4 hours. Pt reports N/V/D x 3 days.  Denies cough, congestion. EMS reports pt's O2 sat in mid 80's improving to 93% with 4L [place].  Pt denies use of oxygen at home.

## 2022-08-18 NOTE — H&P (Addendum)
History and Physical    Patient: [name] [id number] DOB: 1955-08-17 DOA: 08/18/2022 DOS: the patient was seen and examined on 08/18/2022 PCP: [name], MD  Patient coming from: Home  Chief Complaint:  Chief Complaint  Patient presents with   Chest Pain   HPI: [name] is a  67 year old male with a history of coronary disease status post CABG, systolic and diastolic CHF, diabetes mellitus type 2, hypertension, ESRD, SVT, esophageal adenocarcinoma, and OSA presenting with substernal chest pain and shortness of breath.  The patient states that he began developing substernal chest discomfort around 2 AM on 08/18/2022 about 30 to 60 minutes after eating toast and jelly.  Notably, the patient has had intermittent nausea and vomiting for the past week.  He had 4 episodes of nonbloody emesis on 08/17/2022.  In addition, the patient has been complaining of some worsening shortness of breath this past week. He denies any fevers, chills, coughing, hemoptysis, hematemesis,.  He endorses compliance with dialysis on Monday, Wednesday, and Fridays.  His last dialysis was on Wednesday, 08/17/2022.  Because of his chest pain shortness of breath, the patient presented for further evaluation and treatment. Notably, the patient is supposed to be on 2 L nasal cannula at home, but he has been out of oxygen for a couple weeks.  The patient also has long history of gait instability and falls.  He states that he falls about once per week, last fall on 08/12/2022. The patient has had numerous hospital admissions.  Most recently he was hospitalized for about 24 hours on 06/23/2022 for atypical chest pain.  He was discharged home in stable condition.  He had another hospital admission from 05/27/2022 to 05/31/2022 for hematemesis.  EGD on 05/31/2023 showed moderate severe esophagitis without any active bleeding.  In the ED, the patient was afebrile.  He went into SVT and became hypotensive.  He was started on  amiodarone drip.  He required Levophed for short period of time.  He spontaneously converted to sinus rhythm.  He was placed on 4 L nasal cannula with oxygen saturation 100%.  WBC 10.5, hemoglobin 8.6, platelets 216,000.  Sodium 133, potassium 3.6, bicarbonate 29, serum creatinine 3.53.  Lactic acid 1.6.  PCT 0.40.  D-dimer 2.83.  EKG initially showed SVT with nonspecific ST changes. CTA chest was negative for PE but showed a moderate right pleural effusion with atelectasis.  There is nondisplaced left sixth rib fracture.  There is also fluid collection in the left chest wall.  CT of the abdomen was negative for any acute findings.   Review of Systems: As mentioned in the history of present illness. All other systems reviewed and are negative. Past Medical History:  Diagnosis Date   Anemia    Arthritis    hips shoulders   CAD (coronary artery disease) 03/03/2019   Cancer ([place])    Esophageal cancer 2022   CHF (congestive heart failure) ([place]) 2020   Chronic combined systolic and diastolic heart failure ([place]) 04/26/2018   Admitted 11/14-11/20/19-diuresed 10L   Depression 05/31/2021   Fatigue 10/14/2020   Hypertension    Myocardial infarction [facility])    Sleep apnea    uses a bipap machine   Stroke ([place]) 12/14/2020   no last weakness or paralysis   Type 2 diabetes mellitus with diabetic nephropathy ([place]) 04/23/2019   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   Past Surgical History:  Procedure Laterality Date   AV FISTULA PLACEMENT Left 12/21/2021  Procedure: LEFT ARM ARTERIOVENOUS (AV) FISTULA CREATION;  Surgeon: Rosetta Posner, MD;  [facility];  Service: Vascular;  Laterality: Left;   BIOPSY  06/27/2019   Procedure: BIOPSY;  Surgeon: Rogene Houston, MD;  [facility];  Service: Endoscopy;;  ascending colon polyp   BIOPSY  10/21/2020   Procedure: BIOPSY;  Surgeon: Rogene Houston, MD;  [facility];  Service: Endoscopy;;  esophageal,gastric polyp   BIOPSY   02/24/2021   Procedure: BIOPSY;  Surgeon: Rogene Houston, MD;  [facility];  Service: Endoscopy;;  distal and proximal esophageal biopsies    BIOPSY  05/30/2022   Procedure: BIOPSY;  Surgeon: Eloise Harman, DO;  [facility];  Service: Endoscopy;;   CARDIAC SURGERY     CATARACT EXTRACTION     COLONOSCOPY N/A 06/27/2019   [name]:Diverticulosis in the entire examined colon. tubular adenoma in ascending colon, 22m tubular adenoma in prox sigmoid. external hemorrhoids   CORONARY ARTERY BYPASS GRAFT N/A 03/25/2019   Procedure: CORONARY ARTERY BYPASS GRAFTING (CABG) X  4 USING LEFT INTERNAL MAMMARY ARTERY AND RIGHT SAPHENOUS VEIN GRAFTS;  Surgeon: LLajuana Matte MD;  [place] [facility]  Service: Open Heart Surgery;  Laterality: N/A;   ESOPHAGEAL BRUSHING  03/08/2022   Procedure: ESOPHAGEAL BRUSHING;  Surgeon: CEloise Harman DO;  [facility];  Service: Endoscopy;;   ESOPHAGOGASTRODUODENOSCOPY (EGD) WITH PROPOFOL N/A 10/21/2020   [name]:Normal hypopharynx.Normal proximal esophagus and mid esophagus.Esophageal mucosal changes consistent with long-segment Barrett's esophagus. (Focal low-grade dysplasia and atypia proximally) z line irregular 37 cm from incisors, 3 cm HH, single gastric polyp (fundic gland) normal duodenal bulb/second portion of duodenum, proximal margin of Barrett's at 34 cm   ESOPHAGOGASTRODUODENOSCOPY (EGD) WITH PROPOFOL N/A 02/24/2021   Procedure: ESOPHAGOGASTRODUODENOSCOPY (EGD) WITH PROPOFOL;  Surgeon: RRogene Houston MD;  [facility];  Service: Endoscopy;  Laterality: N/A;  1:35   ESOPHAGOGASTRODUODENOSCOPY (EGD) WITH PROPOFOL N/A 03/21/2021   Procedure: ESOPHAGOGASTRODUODENOSCOPY (EGD) WITH PROPOFOL;  Surgeon: HCarol Ada MD;  [facility]: [facility]  Service: Endoscopy;  Laterality: N/A;   ESOPHAGOGASTRODUODENOSCOPY (EGD) WITH PROPOFOL N/A 10/23/2021   Procedure: ESOPHAGOGASTRODUODENOSCOPY (EGD) WITH PROPOFOL;  Surgeon:  DDoran Stabler MD;  Location: [facility]  Service: Gastroenterology;  Laterality: N/A;   ESOPHAGOGASTRODUODENOSCOPY (EGD) WITH PROPOFOL N/A 03/08/2022   Procedure: ESOPHAGOGASTRODUODENOSCOPY (EGD) WITH PROPOFOL;  Surgeon: CEloise Harman DO;  [facility];  Service: Endoscopy;  Laterality: N/A;   ESOPHAGOGASTRODUODENOSCOPY (EGD) WITH PROPOFOL N/A 05/27/2022   Procedure: ESOPHAGOGASTRODUODENOSCOPY (EGD) WITH PROPOFOL;  Surgeon: CHarvel Quale MD;  [facility];  Service: Gastroenterology;  Laterality: N/A;   ESOPHAGOGASTRODUODENOSCOPY (EGD) WITH PROPOFOL N/A 05/30/2022   Procedure: ESOPHAGOGASTRODUODENOSCOPY (EGD) WITH PROPOFOL;  Surgeon: CEloise Harman DO;  [facility];  Service: Endoscopy;  Laterality: N/A;   EXCISION MORTON'S NEUROMA     EYE SURGERY Left    retina   INCISION AND DRAINAGE OF WOUND Right 06/28/2021   Procedure: IRRIGATION AND DEBRIDEMENT WOUND;  Surgeon: PCindra Presume MD;  [place] [facility]  Service: Plastics;  Laterality: Right;  1.5 hour   INSERTION OF DIALYSIS CATHETER Right 12/21/2021   Procedure: INSERTION OF TUNNELED DIALYSIS CATHETER;  Surgeon: ERosetta Posner MD;  [facility];  Service: Vascular;  Laterality: Right;   LEFT HEART CATH AND CORS/GRAFTS ANGIOGRAPHY N/A 02/01/2022   Procedure: LEFT HEART CATH AND CORS/GRAFTS ANGIOGRAPHY;  Surgeon: JMartinique Peter M, MD;  Location: [place] [facility];  Service: Cardiovascular;  Laterality: N/A;   POLYPECTOMY  06/27/2019   Procedure: POLYPECTOMY;  Surgeon: Rogene Houston, MD;  [facility];  Service: Endoscopy;;  proximal sigmoid colon   RIGHT/LEFT HEART CATH AND CORONARY ANGIOGRAPHY N/A 03/12/2019   Procedure: RIGHT/LEFT HEART CATH AND CORONARY ANGIOGRAPHY;  Surgeon: [name], MD;  Location: [place] [facility];  Service: Cardiovascular;  Laterality: N/A;   SHOULDER ARTHROSCOPY WITH ROTATOR CUFF REPAIR AND SUBACROMIAL DECOMPRESSION Right  08/26/2020   Procedure: RIGHT SHOULDER MINI OPEN ROTATOR CUFF REPAIR AND SUBACROMIAL DECOMPRESSION WITH PATCH GRAFT;  Surgeon: Susa Day, MD;  [facility];  Service: Orthopedics;  Laterality: Right;  90 MINS GENERAL WITH BLOCK   SKIN SPLIT GRAFT Right 06/28/2021   Procedure: SKIN GRAFT SPLIT THICKNESS;  Surgeon: [name], MD;  Location: [place];  Service: Plastics;  Laterality: Right;   TEE WITHOUT CARDIOVERSION N/A 03/25/2019   Procedure: TRANSESOPHAGEAL ECHOCARDIOGRAM (TEE);  Surgeon: [name], MD;  Location: [place];  Service: Open Heart Surgery;  Laterality: N/A;   Social History:  reports that he quit smoking about 34 years ago. His smoking use included cigarettes. He has been exposed to tobacco smoke. He has never used smokeless tobacco. He reports that he does not currently use alcohol after a past usage of about 2.0 standard drinks of alcohol per week. He reports that he does not use drugs.  Allergies  Allergen Reactions   Bee Venom Anaphylaxis   Atorvastatin Other (See Comments)    myalgia   Diltiazem Itching   Lopressor [Metoprolol]     NIGHTMARES    Reglan [Metoclopramide] Other (See Comments)    Suicidal    Rosuvastatin Other (See Comments)    myalgias   Valsartan Itching    Family History  Problem Relation Age of Onset   Colon cancer Mother    Heart Problems Father    Diabetes Father    Valvular heart disease Father    Sleep apnea Neg Hx    Stroke Neg Hx     Prior to Admission medications   Medication Sig Start Date End Date Taking? Authorizing Provider  albuterol (VENTOLIN HFA) 108 (90 Base) MCG/ACT inhaler INHALE TWO PUFFS BY MOUTH EVERY 6 HOURS AS NEEDED 08/11/22  Yes [name], [name], MD  amiodarone (PACERONE) 200 MG tablet Take 1 tablet (200 mg total) by mouth daily. 200 mg once daily 08/02/22  Yes Loel Dubonnet, NP  aspirin EC 325 MG tablet Take 1 tablet (325 mg total) by mouth daily with breakfast. 06/23/22  Yes Emokpae, Courage, MD   calcium acetate (PHOSLO) 667 MG capsule Take 667 mg by mouth in the morning, at noon, and at bedtime.   Yes [provider]  doxazosin (CARDURA) 2 MG tablet TAKE 1 TABLET BY MOUTH DAILY 07/29/22  Yes [name], Scott A, MD  EPINEPHrine 0.3 mg/0.3 mL IJ SOAJ injection Inject 0.3 mg into the muscle as needed for anaphylaxis. 01/25/21  Yes [name], MD  hydrALAZINE (APRESOLINE) 50 MG tablet Take 1 tablet (50 mg total) by mouth 3 (three) times daily. 06/23/22  Yes Emokpae, Courage, MD  LANTUS 100 UNIT/ML injection 8 mLs daily. Sliding scale 04/07/22  Yes [provider]  metoprolol succinate (TOPROL-XL) 50 MG 24 hr tablet Take 1 tablet (50 mg total) by mouth daily. 08/02/22  Yes Loel Dubonnet, NP  nitroGLYCERIN (NITROSTAT) 0.4 MG SL tablet Place 1 tablet (0.4 mg total) under  the tongue every 5 (five) minutes as needed for chest pain. 09/14/21  Yes Loel Dubonnet, NP  NOVOLOG FLEXPEN 100 UNIT/ML FlexPen Inject 4-10 Units into the skin 3 (three) times daily with meals. 07/07/22  Yes [provider]  ondansetron (ZOFRAN-ODT) 4 MG disintegrating tablet Take 1 tablet by mouth every 8 hours as needed for nausea and vomiting. 08/02/22  Yes Loel Dubonnet, NP  pantoprazole (PROTONIX) 40 MG tablet Take 1 tablet (40 mg total) by mouth 2 (two) times daily. 05/30/22  Yes Zollie Clemence, Shanon Brow, MD  sertraline (ZOLOFT) 100 MG tablet TAKE 1 TABLET BY MOUTH DAILY 07/29/22  Yes [name], [name], MD  acetaminophen (TYLENOL) 325 MG tablet Take 2 tablets (650 mg total) by mouth every 6 (six) hours as needed for mild pain, moderate pain or fever. 08/02/21   Allie Bossier, MD  ascorbic acid (VITAMIN C) 500 MG tablet Take 1 tablet (500 mg total) by mouth daily. Patient not taking: Reported on 08/18/2022 08/03/21   Allie Bossier, MD  Evolocumab (REPATHA SURECLICK) [id number] MG/ML SOAJ Inject 140 mg into the skin every 14 (fourteen) days. 06/29/22   Loel Dubonnet, NP    Physical Exam: Vitals:   08/18/22  1417 08/18/22 1430 08/18/22 1500 08/18/22 1548  BP: 137/61     Pulse: 76 74 75   Resp:  15 17   Temp:    98.3 F (36.8 C)  TempSrc:    Oral  SpO2: 96% 96% 97%   Weight:      Height:       GENERAL:  A&O x 3, NAD, well developed, cooperative, follows commands HEENT: House/AT, No thrush, No icterus, No oral ulcers Neck:  No neck mass, No meningismus, soft, supple CV: RRR, no S3, no S4, no rub, no JVD Lungs:  bibasilar rales.  Diminished BS on right.  Chest wall and left flank ecchymosis Abd: soft/NT +BS, nondistended Ext: No edema, no lymphangitis, no cyanosis, no rashes Neuro:  CN II-XII intact, strength 4/5 in RUE, RLE, strength 4/5 LUE, LLE; sensation intact bilateral; no dysmetria; babinski equivocal  Data Reviewed: Data reviewed in history  Assessment and Plan: Chest pain -mostly atypical by history -related to his GI issues, esophageal cancer, vomiting -troponins flat --08/18/22 Echo EF 50%, no WMA, no WMA, normal RVF, PASP 67.1  SVT -previously on  amiodarone 200 mg daily -became hypotensive and required levophed short time  -placed on amio drip>>converted to sinus --08/18/22 Echo EF 50%, no WMA, no WMA, normal RVF, PASP 67.1 -appreciate cardiology consult  Intractable nausea and vomiting -related to his esophagitis/esophageal adenocarcinoma -08/04/22 EGD--Dr. Jerilynn Mages C reflux esophagitis, medium hiatus hernia;  PATHOLOGY>>Cardio-oxyntic mucosa with moderate chronic active gastritis and proton pump inhibitor (PPI) effect.  NO dysplasia or metaplasia on esophageal biopsy -Dr. Evangeline Gula increased omeprazole to TID -start IV pantoprazole -clear liquid diet for now  Acute on chronic respiratory failure with hypoxia -chronically on 2L at home but "ran out" oxygen last 2-3 weeks -now on 4L -wean back to 2L -started on home oxygen after his 12/15-12/19/23 admission -due to fluid overload and right pleural effusion -08/18/22 CTA chest--mod R-pleural effusion, no PE,  nondisplaced left 6th rib fracture  Chronic combined systolic and diastolic CHF -123456 Echo--EF 40-45%, no WMA, mod RV dysfx, PASP 56.5  -03/01/22 Echo--EF 40-45%, global HK, RV overload  -08/18/22 Echo EF 50%, no WMA, no WMA, normal RVF, PASP 67.1 -Fluid removal via dialysis  Acute Blood Loss Anemia -06/23/22 Hgb 11.7 -presented with  Hgb 8.6 -due to fall and ecchymosis/hematoma on left chest/flank   ESRD -He dialyzes on Monday, Wednesday, Friday -Consulted nephrology for maintenance dialysis -He was started on dialysis sometime in August 2023 -Nephrology consulted for maintenance dialysis -last dialyzed on 08/16/32 -plan next HD 3/8   Right pleural effusion -This has been chronic -He has had previous thoracocentesis in the past, last done 03/01/2022 -request thora 3/7   S/P CABG (coronary artery bypass graft) Recent cardiac cath 01/2021, showed patent grafts, severe three-vessel native CAD.Marland Kitchen  History of CABG 2020.   Essential hypertension -Restart metoprolol succinate -Holding hydralazine and doxazosin for soft blood pressures initially   Esophageal adenocarcinoma (HCC) Follows GI at [facility], Dr. [name] --considered poor candidate for esophagectomy. After multidisciplinary discussion the plan was to continue with endoscopic treatments, and continue to follow-up with oncology for surveillance and any signs of progression of disease -status post EMR, cryotherapy, and most recently RFA in April 2023 -10/01/21 PET scan--no local recurrence -08/04/22 EGD--Dr. Jerilynn Mages C reflux esophagitis, medium hiatus hernia;  PATHOLOGY>>Cardio-oxyntic mucosa with moderate chronic active gastritis and proton pump inhibitor (PPI) effect.  NO dysplasia or metaplasia on esophageal biopsy -Dr. Evangeline Gula increased omeprazole to TID  Uncontrolled diabetes mellitus type 2 with hyperglycemia -01/31/22 A1C--7.7 -novolog sliding scale -recheck A1C   Advance Care Planning: FULL CODE  Consults:  cardiology, renal  Family Communication: sister at bedside 3/7  Severity of Illness: The appropriate patient status for this patient is INPATIENT. Inpatient status is judged to be reasonable and necessary in order to provide the required intensity of service to ensure the patient's safety. The patient's presenting symptoms, physical exam findings, and initial radiographic and laboratory data in the context of their chronic comorbidities is felt to place them at high risk for further clinical deterioration. Furthermore, it is not anticipated that the patient will be medically stable for discharge from the hospital within 2 midnights of admission.   * I certify that at the point of admission it is my clinical judgment that the patient will require inpatient hospital care spanning beyond 2 midnights from the point of admission due to high intensity of service, high risk for further deterioration and high frequency of surveillance required.*  Author: Orson Eva, MD 08/18/2022 4:44 PM  For on call review [email or web address].

## 2022-08-18 NOTE — ED Notes (Signed)
Echo at bedside

## 2022-08-18 NOTE — Telephone Encounter (Signed)
Front  I filled out the form (again) please make sure he has all of our information regarding office phone number etc. on it.  Please fax this as requested below Please make sure that a copy of this is scanned into the EMR and also please provide me a copy just in case it gets lost again Thank you-Dr. [name]  Nurses Please connect with his home health nurse let them know that we are trying to get an assistant through the state, help him on a daily basis.  We have sent in the form hopefully they will come and evaluate him.  Please also make sure the patient is aware that they will be calling him to set this up.  But also find out from the home health nurse are there any helpers or aids they consent to him under his insurance once a week or anything else that could be done to help him he has a very difficult time with ADLs because of his weakness.  Please let me know which find out there if the home health nurse needs to call me she can call me on my cell number [phone] on any workday that I am here thank you  Saporito, Maree Erie, LCSW  [name], MD Good morning Dr. Wolfgang Phoenix, Unfortunately I was unable to find the form, so I just went ahead and generated a new one this morning.  I have faxed to your office, in attention to you.  Once completed, please have your nurse fax to [facility] (Fax # [phone]), for processing.  Thank you!  Di Kindle

## 2022-08-18 NOTE — Procedures (Signed)
PROCEDURE SUMMARY:  Successful US guided right thoracentesis. Yielded 1.2 L of clear yellow fluid. Pt tolerated procedure well. No immediate complications.  Specimen sent for labs. CXR ordered; no post-procedure pneumothorax identified.   EBL < 2 mL  Theresa Duty, NP 08/18/2022 4:00 PM

## 2022-08-18 NOTE — Consult Note (Signed)
Cardiology Consultation   Patient ID: [name] MRN: [id number]; DOB: 1955/10/16  Admit date: 08/18/2022 Date of Consult: 08/18/2022  PCP:  [name], MD   [facility] Providers Cardiologist:  Skeet Latch, MD        Patient Profile:   [name] is a 67 y.o. male with a hx of CAD, HFrEF, PSVT on amio, HTN, HL  who is being seen 08/18/2022 for the evaluation of chest pain at the request of Dr Philip Aspen.  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  Had just eatin some toast and jelly at night, was sitting on the side of the bed and started feeling nauseous. Shortly after left sided sharp 10/10 chest pain with some SOB, palpitations. Lasted 45 min and did not resolve, at that point triggered his life alert. EMS arrived and brought to the ER, pain resolved about about 1 hour in the ER, so total 1hr 45 minutes. Pain similar to his chronic prior chest pains.   In ER runs of SVT, transient hypotension SBPs to 60s started on low dose levophed but able to wean off very quickly. Started on amio gtt, SVT has resolved and chest symptoms have resolved.     ER 111/55 p 87 89% 2L [place] BNP 3202 (>4500 3 and 5 months ago). Hgb 8.6 (11.7 1 month ago) Plt 316 K 3.6 Cr 3.53 BUN 25 Ddimer 2.83 Mg 3.1 Trop 23-->23-->20 Echo LVEF 50%, no WMAs  CXR : mild edema EKG SR no acute ischemic changes, then SVT with aberrancy   01/2022 cath severe 3 vessel native disease, patent grafts SVG-OM2, SVG-D2, LIMA-lAD, SVG-RPL  02/2022 echo: LVEF 40-45%, mod RV dysfunction  Past Medical History:  Diagnosis Date   Anemia    Arthritis    hips shoulders   CAD (coronary artery disease) 03/03/2019   Cancer ([place])    Esophageal cancer 2022   CHF (congestive heart failure) ([place]) 2020    Chronic combined systolic and diastolic heart failure ([place]) 04/26/2018   Admitted 11/14-11/20/19-diuresed 10L   Depression 05/31/2021   Fatigue 10/14/2020   Hypertension    Myocardial infarction [facility])    Sleep apnea    uses a bipap machine   Stroke ([place]) 12/14/2020   no last weakness or paralysis   Type 2 diabetes mellitus with diabetic nephropathy ([place]) 04/23/2019   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    Past Surgical History:  Procedure Laterality Date   AV FISTULA PLACEMENT Left 12/21/2021   Procedure: LEFT ARM ARTERIOVENOUS (AV) FISTULA CREATION;  Surgeon: Rosetta Posner, MD;  [facility]: [facility];  Service: Vascular;  Laterality: Left;   BIOPSY  06/27/2019   Procedure: BIOPSY;  Surgeon: Rogene Houston, MD;  [facility];  Service: Endoscopy;;  ascending colon polyp   BIOPSY  10/21/2020   Procedure: BIOPSY;  Surgeon: Rogene Houston, MD;  [facility];  Service: Endoscopy;;  esophageal,gastric polyp   BIOPSY  02/24/2021   Procedure: BIOPSY;  Surgeon: Rogene Houston, MD;  [facility];  Service: Endoscopy;;  distal and proximal esophageal biopsies    BIOPSY  05/30/2022   Procedure: BIOPSY;  Surgeon: Eloise Harman, DO;  [facility];  Service: Endoscopy;;   CARDIAC SURGERY  CATARACT EXTRACTION     COLONOSCOPY N/A 06/27/2019   [name]:Diverticulosis in the entire examined colon. tubular adenoma in ascending colon, 26m tubular adenoma in prox sigmoid. external hemorrhoids   CORONARY ARTERY BYPASS GRAFT N/A 03/25/2019   Procedure: CORONARY ARTERY BYPASS GRAFTING (CABG) X  4 USING LEFT INTERNAL MAMMARY ARTERY AND RIGHT SAPHENOUS VEIN GRAFTS;  Surgeon: LLajuana Matte MD;  [place]  Service: Open Heart Surgery;  Laterality: N/A;   ESOPHAGEAL BRUSHING  03/08/2022   Procedure: ESOPHAGEAL BRUSHING;  Surgeon: CEloise Harman DO;  [facility];  Service: Endoscopy;;   ESOPHAGOGASTRODUODENOSCOPY (EGD) WITH PROPOFOL  N/A 10/21/2020   rehman:Normal hypopharynx.Normal proximal esophagus and mid esophagus.Esophageal mucosal changes consistent with long-segment Barrett's esophagus. (Focal low-grade dysplasia and atypia proximally) z line irregular 37 cm from incisors, 3 cm HH, single gastric polyp (fundic gland) normal duodenal bulb/second portion of duodenum, proximal margin of Barrett's at 34 cm   ESOPHAGOGASTRODUODENOSCOPY (EGD) WITH PROPOFOL N/A 02/24/2021   Procedure: ESOPHAGOGASTRODUODENOSCOPY (EGD) WITH PROPOFOL;  Surgeon: RRogene Houston MD;  [facility];  Service: Endoscopy;  Laterality: N/A;  1:35   ESOPHAGOGASTRODUODENOSCOPY (EGD) WITH PROPOFOL N/A 03/21/2021   Procedure: ESOPHAGOGASTRODUODENOSCOPY (EGD) WITH PROPOFOL;  Surgeon: HCarol Ada MD;  [place]  Service: Endoscopy;  Laterality: N/A;   ESOPHAGOGASTRODUODENOSCOPY (EGD) WITH PROPOFOL N/A 10/23/2021   Procedure: ESOPHAGOGASTRODUODENOSCOPY (EGD) WITH PROPOFOL;  Surgeon: DDoran Stabler MD;  [place]  Service: Gastroenterology;  Laterality: N/A;   ESOPHAGOGASTRODUODENOSCOPY (EGD) WITH PROPOFOL N/A 03/08/2022   Procedure: ESOPHAGOGASTRODUODENOSCOPY (EGD) WITH PROPOFOL;  Surgeon: CEloise Harman DO;  [facility];  Service: Endoscopy;  Laterality: N/A;   ESOPHAGOGASTRODUODENOSCOPY (EGD) WITH PROPOFOL N/A 05/27/2022   Procedure: ESOPHAGOGASTRODUODENOSCOPY (EGD) WITH PROPOFOL;  Surgeon: CHarvel Quale MD;  [facility];  Service: Gastroenterology;  Laterality: N/A;   ESOPHAGOGASTRODUODENOSCOPY (EGD) WITH PROPOFOL N/A 05/30/2022   Procedure: ESOPHAGOGASTRODUODENOSCOPY (EGD) WITH PROPOFOL;  Surgeon: CEloise Harman DO;  [facility];  Service: Endoscopy;  Laterality: N/A;   EXCISION MORTON'S NEUROMA     EYE SURGERY Left    retina   INCISION AND DRAINAGE OF WOUND Right 06/28/2021   Procedure: IRRIGATION AND DEBRIDEMENT WOUND;  Surgeon: PCindra Presume MD;   [place]  Service: Plastics;  Laterality: Right;  1.5 hour   INSERTION OF DIALYSIS CATHETER Right 12/21/2021   Procedure: INSERTION OF TUNNELED DIALYSIS CATHETER;  Surgeon: ERosetta Posner MD;  [facility];  Service: Vascular;  Laterality: Right;   LEFT HEART CATH AND CORS/GRAFTS ANGIOGRAPHY N/A 02/01/2022   Procedure: LEFT HEART CATH AND CORS/GRAFTS ANGIOGRAPHY;  Surgeon: JMartinique Peter M, MD;  [facility];  Service: Cardiovascular;  Laterality: N/A;   POLYPECTOMY  06/27/2019   Procedure: POLYPECTOMY;  Surgeon: RRogene Houston MD;  [facility];  Service: Endoscopy;;  proximal sigmoid colon   RIGHT/LEFT HEART CATH AND CORONARY ANGIOGRAPHY N/A 03/12/2019   Procedure: RIGHT/LEFT HEART CATH AND CORONARY ANGIOGRAPHY;  Surgeon: VJettie Booze MD;  [facility];  Service: Cardiovascular;  Laterality: N/A;   SHOULDER ARTHROSCOPY WITH ROTATOR CUFF REPAIR AND SUBACROMIAL DECOMPRESSION Right 08/26/2020   Procedure: RIGHT SHOULDER MINI OPEN ROTATOR CUFF REPAIR AND SUBACROMIAL DECOMPRESSION WITH PATCH GRAFT;  Surgeon: BSusa Day MD;  [facility];  Service: Orthopedics;  Laterality: Right;  90 MINS GENERAL WITH BLOCK   SKIN SPLIT GRAFT Right 06/28/2021   Procedure: SKIN GRAFT  SPLIT THICKNESS;  Surgeon: [name], MD;  Location: [place];  Service: Plastics;  Laterality: Right;   TEE WITHOUT CARDIOVERSION N/A 03/25/2019   Procedure: TRANSESOPHAGEAL ECHOCARDIOGRAM (TEE);  Surgeon: [name], MD;  Location: [place];  Service: Open Heart Surgery;  Laterality: N/A;     Inpatient Medications: Scheduled Meds:  amiodarone  150 mg Intravenous Once   EPINEPHrine NaCl       etomidate       etomidate  10 mg Intravenous Once   Continuous Infusions:  amiodarone     Followed by   amiodarone     amiodarone     norepinephrine (LEVOPHED) Adult infusion     sodium chloride     PRN Meds: amiodarone, EPINEPHrine NaCl,  etomidate  Allergies:    Allergies  Allergen Reactions   Bee Venom Anaphylaxis   Atorvastatin Other (See Comments)    myalgia   Diltiazem Itching   Lopressor [Metoprolol]     NIGHTMARES    Reglan [Metoclopramide] Other (See Comments)    Suicidal    Rosuvastatin Other (See Comments)    myalgias   Valsartan Itching    Social History:   Social History   Socioeconomic History   Marital status: Single    Spouse name: Not on file   Number of children: Not on file   Years of education: 12   Highest education level: 12th grade  Occupational History   Not on file  Tobacco Use   Smoking status: Former    Types: Cigarettes    Quit date: 07/06/1988    Years since quitting: 34.1    Passive exposure: Past   Smokeless tobacco: Never  Vaping Use   Vaping Use: Never used  Substance and Sexual Activity   Alcohol use: Not Currently    Alcohol/week: 2.0 standard drinks of alcohol    Types: 2 Glasses of wine per week    Comment: socially   Drug use: No   Sexual activity: Not Currently    Partners: Female  Other Topics Concern   Not on file  Social History Narrative   Not on file   Social Determinants of Health   Financial Resource Strain: Low Risk  (04/21/2022)   Overall Financial Resource Strain (CARDIA)    Difficulty of Paying Living Expenses: Not hard at all  Food Insecurity: No Food Insecurity (04/21/2022)   Hunger Vital Sign    Worried About Running Out of Food in the Last Year: Never true    Ran Out of Food in the Last Year: Never true  Transportation Needs: Unmet Transportation Needs (06/03/2022)   PRAPARE - Hydrologist (Medical): Yes    Lack of Transportation (Non-Medical): No  Physical Activity: Inactive (04/21/2022)   Exercise Vital Sign    Days of Exercise per Week: 0 days    Minutes of Exercise per Session: 0 min  Stress: No Stress Concern Present (04/21/2022)   [place]    Feeling of Stress : Only a little  Social Connections: Unknown (04/21/2022)   Social Connection and Isolation Panel [NHANES]    Frequency of Communication with Friends and Family: More than three times a week    Frequency of Social Gatherings with Friends and Family: More than three times a week    Attends Religious Services: More than 4 times per year    Active Member of [employer] or Organizations: Yes    Attends [employer]  or Organization Meetings: More than 4 times per year    Marital Status: Not on file  Intimate Partner Violence: Not At Risk (04/21/2022)   Humiliation, Afraid, Rape, and Kick questionnaire    Fear of Current or Ex-Partner: No    Emotionally Abused: No    Physically Abused: No    Sexually Abused: No    Family History:    Family History  Problem Relation Age of Onset   Colon cancer Mother    Heart Problems Father    Diabetes Father    Valvular heart disease Father    Sleep apnea Neg Hx    Stroke Neg Hx      ROS:  Please see the history of present illness.   All other ROS reviewed and negative.     Physical Exam/Data:   Vitals:   08/18/22 0806 08/18/22 0808  BP:  (!) 111/55  Pulse:  87  Resp:  18  Temp:  98.1 F (36.7 C)  TempSrc:  Oral  SpO2:  90%  Weight: 90.3 kg   Height: '5\' 10"'$  (1.778 m)    No intake or output data in the 24 hours ending 08/18/22 0854    08/18/2022    8:06 AM 08/11/2022    9:54 AM 08/02/2022    1:40 PM  Last 3 Weights  Weight (lbs) 199 lb 199 lb 206 lb  Weight (kg) 90.266 kg 90.266 kg 93.441 kg     Body mass index is 28.55 kg/m.  General:  Well nourished, well developed, in no acute distress HEENT: normal Neck: no JVD Vascular: No carotid bruits; Distal pulses 2+ bilaterally Cardiac:  normal S1, S2; RRR; no murmur  Lungs:  clear to auscultation bilaterally, no wheezing, rhonchi or rales  Abd: soft, nontender, no hepatomegaly  Ext: no edema Musculoskeletal:  No deformities, BUE and BLE strength normal and  equal Skin: warm and dry  Neuro:  CNs 2-12 intact, no focal abnormalities noted Psych:  Normal affect     Laboratory Data:  High Sensitivity Troponin:  No results for input(s): "TROPONINIHS" in the last 720 hours.   ChemistryNo results for input(s): "NA", "K", "CL", "CO2", "GLUCOSE", "BUN", "CREATININE", "CALCIUM", "MG", "GFRNONAA", "GFRAA", "ANIONGAP" in the last 168 hours.  No results for input(s): "PROT", "ALBUMIN", "AST", "ALT", "ALKPHOS", "BILITOT" in the last 168 hours. Lipids No results for input(s): "CHOL", "TRIG", "HDL", "LABVLDL", "LDLCALC", "CHOLHDL" in the last 168 hours.  HematologyNo results for input(s): "WBC", "RBC", "HGB", "HCT", "MCV", "MCH", "MCHC", "RDW", "PLT" in the last 168 hours. Thyroid No results for input(s): "TSH", "FREET4" in the last 168 hours.  BNPNo results for input(s): "BNP", "PROBNP" in the last 168 hours.  DDimer No results for input(s): "DDIMER" in the last 168 hours.   Radiology/Studies:  No results found.   Assessment and Plan:   1.PSVT - long history of PSVT typiclaly symptomatic with palpitaitons and chest pains, has been on toprol and amio at home. Reports medication compliance - runs of PSVT in ER with aberrancy. STarted on amio gtt, SVT has resolved with resolution of palpitations and chest pain.   - continue amio overnight, if rhythm remains stable likely transition to oral loading dose tomorrow.  - would hold beta blocker today, if bp's remain stable restart tomorrow   2. Hypotension - transient hypotension in ER in setting of paroxsymal SVT, anemia - transiently on low dose levophed, has been weaned off - lactic acid normal, pro cal neg, echo with normal LVEF no acute findings -  suspect due to anemia combined with SVT.    3. CAD - LIMA to LAD, SVG to D2, OM and PLV in 2020  - LHC from 8/23 showed patent grafts  - no evidence of ACS this admission - chest pain similar to his chronic symptoms he often has in setting of SVT,  nearly 2 hours constant left sided pain and palpitations resolved with stabilization of heart rhythm - no plans for repeat ischemic testing at this time.   4. Chronic HFrrEF - 02/2022 echo LVEF 40-45% - 08/2022 echo LVEF 50% - volume mangement per HD  5. Hyperlipidemia - intolerant to statins, on repatha   6. ESRD  7. Esophageal cancer  8. Anemia - Hgb 11.7-->8.6 over the last month - denies any noted bleeding - workup per primary team    For questions or updates, please contact [facility] Please consult [email or web address] for contact info under    Signed, Carlyle Dolly, MD  08/18/2022 8:54 AM

## 2022-08-18 NOTE — ED Provider Notes (Signed)
[place] Provider Note   CSN: [id number] Arrival date & time: 08/18/22  0754     History  Chief Complaint  Patient presents with   Chest Pain    [name] is a 67 y.o. male.  67 year old male with a history of ESRD on IHD, esophageal cancer not currently on treatment, CHF, CAD status post CABG on aspirin, and diabetes who presents emergency department with chest pain and shortness of breath.  Patient reports that this morning at 2 or 3 AM he started experiencing a dull pressure in the left side of his chest that felt like someone was sitting on his chest.  Cannot member if he took his aspirin this morning and called 911.  When EMS arrived he was hypoxic and was placed on 4 L nasal cannula.  Was given nitroglycerin with improvement of his pain.  EMS did not give aspirin.  Unsure if it is exertional.  Has had vomiting.  No diaphoresis.  Says it is vomiting has been nonbloody and nonbilious and started several days ago.  Has been compliant with his dialysis.  Says he is also having some shortness of breath and dizziness at this time.  Has bruising on his abdomen that he reports is due to insulin shots.  Thinks that he is on an additional blood thinner but is unsure of what it is.       Home Medications Prior to Admission medications   Medication Sig Start Date End Date Taking? Authorizing Provider  albuterol (VENTOLIN HFA) 108 (90 Base) MCG/ACT inhaler INHALE TWO PUFFS BY MOUTH EVERY 6 HOURS AS NEEDED 08/11/22  Yes [name], [name], MD  amiodarone (PACERONE) 200 MG tablet Take 1 tablet (200 mg total) by mouth daily. 200 mg once daily 08/02/22  Yes Loel Dubonnet, NP  aspirin EC 325 MG tablet Take 1 tablet (325 mg total) by mouth daily with breakfast. 06/23/22  Yes Emokpae, Courage, MD  calcium acetate (PHOSLO) 667 MG capsule Take 667 mg by mouth in the morning, at noon, and at bedtime.   Yes [provider]  doxazosin (CARDURA)  2 MG tablet TAKE 1 TABLET BY MOUTH DAILY 07/29/22  Yes [name], Scott A, MD  EPINEPHrine 0.3 mg/0.3 mL IJ SOAJ injection Inject 0.3 mg into the muscle as needed for anaphylaxis. 01/25/21  Yes [name], MD  hydrALAZINE (APRESOLINE) 50 MG tablet Take 1 tablet (50 mg total) by mouth 3 (three) times daily. 06/23/22  Yes Emokpae, Courage, MD  LANTUS 100 UNIT/ML injection 8 mLs daily. Sliding scale 04/07/22  Yes [provider]  metoprolol succinate (TOPROL-XL) 50 MG 24 hr tablet Take 1 tablet (50 mg total) by mouth daily. 08/02/22  Yes Loel Dubonnet, NP  nitroGLYCERIN (NITROSTAT) 0.4 MG SL tablet Place 1 tablet (0.4 mg total) under the tongue every 5 (five) minutes as needed for chest pain. 09/14/21  Yes Loel Dubonnet, NP  NOVOLOG FLEXPEN 100 UNIT/ML FlexPen Inject 4-10 Units into the skin 3 (three) times daily with meals. 07/07/22  Yes [provider]  ondansetron (ZOFRAN-ODT) 4 MG disintegrating tablet Take 1 tablet by mouth every 8 hours as needed for nausea and vomiting. 08/02/22  Yes Loel Dubonnet, NP  pantoprazole (PROTONIX) 40 MG tablet Take 1 tablet (40 mg total) by mouth 2 (two) times daily. 05/30/22  Yes Tat, Shanon Brow, MD  sertraline (ZOLOFT) 100 MG tablet TAKE 1 TABLET BY MOUTH DAILY 07/29/22  Yes [name]  A, MD  acetaminophen (TYLENOL) 325 MG tablet Take 2 tablets (650 mg total) by mouth every 6 (six) hours as needed for mild pain, moderate pain or fever. 08/02/21   Allie Bossier, MD  ascorbic acid (VITAMIN C) 500 MG tablet Take 1 tablet (500 mg total) by mouth daily. Patient not taking: Reported on 08/18/2022 08/03/21   Allie Bossier, MD  Evolocumab (REPATHA SURECLICK) [id number] MG/ML SOAJ Inject 140 mg into the skin every 14 (fourteen) days. 06/29/22   Loel Dubonnet, NP      Allergies    Bee venom, Atorvastatin, Diltiazem, Lopressor [metoprolol], Reglan [metoclopramide], Rosuvastatin, and Valsartan    Review of Systems   Review of Systems  Physical  Exam Updated Vital Signs BP (!) 143/62   Pulse 72   Temp 98.3 F (36.8 C) (Oral)   Resp 19   Ht '5\' 10"'$  (1.778 m)   Wt 89.5 kg   SpO2 95%   BMI 28.31 kg/m  Physical Exam Vitals and nursing note reviewed.  Constitutional:      General: He is not in acute distress.    Appearance: He is well-developed. He is ill-appearing.     Comments: On 3 L nasal cannula which is a new oxygen requirement  HENT:     Head: Normocephalic and atraumatic.     Right Ear: External ear normal.     Left Ear: External ear normal.     Nose: Nose normal.  Eyes:     Extraocular Movements: Extraocular movements intact.     Conjunctiva/sclera: Conjunctivae normal.     Pupils: Pupils are equal, round, and reactive to light.  Cardiovascular:     Rate and Rhythm: Normal rate and regular rhythm.     Heart sounds: Normal heart sounds.     Comments: Chest pain not reproducible Pulmonary:     Effort: Pulmonary effort is normal. No respiratory distress.     Breath sounds: Rales (Bibasilar left greater than right) present.  Abdominal:     General: There is no distension.     Palpations: Abdomen is soft. There is no mass.     Tenderness: There is no abdominal tenderness. There is no guarding.     Comments: Bruising noted over abdomen  Musculoskeletal:     Cervical back: Normal range of motion and neck supple.     Right lower leg: Edema present.     Left lower leg: Edema present.  Skin:    General: Skin is warm and dry.  Neurological:     Mental Status: He is alert. Mental status is at baseline.  Psychiatric:        Mood and Affect: Mood normal.        Behavior: Behavior normal.     ED Results / Procedures / Treatments   Labs (all labs ordered are listed, but only abnormal results are displayed) Labs Reviewed  CBC - Abnormal; Notable for the following components:      Result Value   RBC 3.11 (*)    Hemoglobin 8.6 (*)    HCT 28.0 (*)    RDW 20.6 (*)    All other components within normal limits   COMPREHENSIVE METABOLIC PANEL - Abnormal; Notable for the following components:   Sodium 133 (*)    Chloride 92 (*)    Glucose, Bld 321 (*)    BUN 25 (*)    Creatinine, Ser 3.53 (*)    Total Protein 6.3 (*)    Albumin 3.2 (*)  AST 12 (*)    GFR, Estimated 18 (*)    All other components within normal limits  APTT - Abnormal; Notable for the following components:   aPTT 37 (*)    All other components within normal limits  D-DIMER, QUANTITATIVE - Abnormal; Notable for the following components:   D-Dimer, Quant 2.83 (*)    All other components within normal limits  LIPASE, BLOOD - Abnormal; Notable for the following components:   Lipase 52 (*)    All other components within normal limits  BRAIN NATRIURETIC PEPTIDE - Abnormal; Notable for the following components:   B Natriuretic Peptide 3,202.0 (*)    All other components within normal limits  MAGNESIUM - Abnormal; Notable for the following components:   Magnesium 3.1 (*)    All other components within normal limits  HEMOGLOBIN A1C - Abnormal; Notable for the following components:   Hgb A1c MFr Bld 8.1 (*)    All other components within normal limits  BODY FLUID CELL COUNT WITH DIFFERENTIAL - Abnormal; Notable for the following components:   Color, Fluid YELLOW (*)    All other components within normal limits  LACTATE DEHYDROGENASE, PLEURAL OR PERITONEAL FLUID - Abnormal; Notable for the following components:   LD, Fluid 96 (*)    All other components within normal limits  GLUCOSE, CAPILLARY - Abnormal; Notable for the following components:   Glucose-Capillary 262 (*)    All other components within normal limits  CBC - Abnormal; Notable for the following components:   RBC 3.14 (*)    Hemoglobin 8.6 (*)    HCT 28.5 (*)    RDW 20.6 (*)    All other components within normal limits  RENAL FUNCTION PANEL - Abnormal; Notable for the following components:   Sodium 132 (*)    Chloride 92 (*)    Glucose, Bld 281 (*)    BUN 28  (*)    Creatinine, Ser 4.23 (*)    Albumin 2.9 (*)    GFR, Estimated 15 (*)    All other components within normal limits  HEPATIC FUNCTION PANEL - Abnormal; Notable for the following components:   Total Protein 5.8 (*)    Albumin 2.9 (*)    AST 9 (*)    Indirect Bilirubin 1.0 (*)    All other components within normal limits  GLUCOSE, CAPILLARY - Abnormal; Notable for the following components:   Glucose-Capillary 178 (*)    All other components within normal limits  GLUCOSE, CAPILLARY - Abnormal; Notable for the following components:   Glucose-Capillary 164 (*)    All other components within normal limits  GLUCOSE, CAPILLARY - Abnormal; Notable for the following components:   Glucose-Capillary 232 (*)    All other components within normal limits  GLUCOSE, CAPILLARY - Abnormal; Notable for the following components:   Glucose-Capillary 259 (*)    All other components within normal limits  CBG MONITORING, ED - Abnormal; Notable for the following components:   Glucose-Capillary 318 (*)    All other components within normal limits  TROPONIN I (HIGH SENSITIVITY) - Abnormal; Notable for the following components:   Troponin I (High Sensitivity) 23 (*)    All other components within normal limits  TROPONIN I (HIGH SENSITIVITY) - Abnormal; Notable for the following components:   Troponin I (High Sensitivity) 23 (*)    All other components within normal limits  TROPONIN I (HIGH SENSITIVITY) - Abnormal; Notable for the following components:   Troponin I (High Sensitivity) 20 (*)  All other components within normal limits  MRSA NEXT GEN BY PCR, NASAL  GRAM STAIN  CULTURE, BODY FLUID W GRAM STAIN -BOTTLE  PROTIME-INR  LACTIC ACID, PLASMA  PROCALCITONIN  PROTEIN, PLEURAL OR PERITONEAL FLUID  LACTATE DEHYDROGENASE  OCCULT BLOOD X 1 CARD TO LAB, STOOL  HEPATITIS B SURFACE ANTIBODY, QUANTITATIVE  HEPATITIS B SURFACE ANTIGEN  CBC  RENAL FUNCTION PANEL  SAMPLE TO BLOOD BANK  CYTOLOGY -  NON PAP    EKG EKG Interpretation  Date/Time:  Thursday August 18 2022 09:05:12 EST Ventricular Rate:  95 PR Interval:  202 QRS Duration: 134 QT Interval:  498 QTC Calculation: 627 R Axis:   114 Text Interpretation: probable sinus rhythm Nonspecific intraventricular conduction delay Inferior infarct, age indeterminate Lateral leads are also involved Confirmed by Malvin Johns [phone]) on 08/19/2022 9:21:54 AM  Radiology US THORACENTESIS ASP PLEURAL SPACE W/IMG GUIDE  Result Date: 08/18/2022 INDICATION: Patient with a history of heart failure and gastric/esophageal cancer with right pleural effusion. Interventional Radiology asked to perform a diagnostic and therapeutic thoracentesis. EXAM: ULTRASOUND GUIDED THORACENTESIS MEDICATIONS: None. COMPLICATIONS: None immediate. PROCEDURE: An ultrasound guided thoracentesis was thoroughly discussed with the patient and questions answered. The benefits, risks, alternatives and complications were also discussed. The patient understands and wishes to proceed with the procedure. Written consent was obtained. Ultrasound was performed to localize and mark an adequate pocket of fluid in the right chest. The area was then prepped and draped in the normal sterile fashion. 1% Lidocaine was used for local anesthesia. Under ultrasound guidance a 6 Fr Safe-T-Centesis catheter was introduced. Thoracentesis was performed. The catheter was removed and a dressing applied. FINDINGS: A total of approximately 1.2 L of clear yellow fluid was removed. Samples were sent to the laboratory as requested by the clinical team. IMPRESSION: Successful ultrasound guided right thoracentesis yielding 1.2 L of pleural fluid. Read by: Soyla Dryer, NP Electronically Signed   By: Titus Dubin M.D.   On: 08/18/2022 16:35   DG Chest Port 1 View  Result Date: 08/18/2022 CLINICAL DATA:  Status post right thoracentesis EXAM: PORTABLE CHEST 1 VIEW COMPARISON:  CT chest 08/18/2022 and chest  radiograph 08/18/2022 FINDINGS: Previous right pleural effusion no longer readily apparent. No visible pneumothorax. Moderate enlargement of the cardiopericardial silhouette. Prior CABG. Indistinct pulmonary vasculature may reflect pulmonary venous hypertension but there is no overt edema. IMPRESSION: 1. Previous right pleural effusion no longer readily apparent. No pneumothorax. 2. Moderate enlargement of the cardiopericardial silhouette with potential pulmonary venous hypertension but no overt edema. 3. Prior CABG. Electronically Signed   By: Van Clines M.D.   On: 08/18/2022 15:34   ECHOCARDIOGRAM COMPLETE  Result Date: 08/18/2022    ECHOCARDIOGRAM REPORT   Patient Name:   CORDAIRO MUMBY Date of Exam: 08/18/2022 Medical Rec #:  [id number]        Height:       70.0 in Accession #:    [id number]       Weight:       199.0 lb Date of Birth:  Mar 21, 1956        BSA:          2.083 m Patient Age:    47 years         BP:           154/73 mmHg Patient Gender: M                HR:  82 bpm. Exam Location:  [facility] Procedure: 2D Echo, Cardiac Doppler and Color Doppler STAT ECHO Indications:    Chest Pain R07.9  History:        Patient has prior history of Echocardiogram examinations, most                 recent 02/19/2022. CHF and Cardiomyopathy, CAD, Prior CABG,                 Stroke, Arrythmias:Tachycardia, Signs/Symptoms:Shortness of                 Breath and Chest Pain; Risk Factors:Hypertension, Diabetes and                 Dyslipidemia.  Sonographer:    Alvino Chapel RCS Referring Phys: [id number] [place]  1. Left ventricular ejection fraction, by estimation, is 50%. The left ventricle has low normal function. The left ventricle has no regional wall motion abnormalities. There is moderate left ventricular hypertrophy. Left ventricular diastolic parameters  are consistent with Grade II diastolic dysfunction (pseudonormalization). Elevated left atrial pressure.  2. Right  ventricular systolic function is normal. The right ventricular size is mildly enlarged. There is severely elevated pulmonary artery systolic pressure.  3. Left atrial size was severely dilated.  4. Right atrial size was severely dilated.  5. The mitral valve is abnormal. Trivial mitral valve regurgitation.  6. The tricuspid valve is abnormal.  7. The aortic valve has an indeterminant number of cusps. Aortic valve regurgitation is not visualized. No aortic stenosis is present.  8. The inferior vena cava is dilated in size with <50% respiratory variability, suggesting right atrial pressure of 15 mmHg. FINDINGS  Left Ventricle: Left ventricular ejection fraction, by estimation, is 50%. The left ventricle has low normal function. The left ventricle has no regional wall motion abnormalities. Definity contrast agent was given IV to delineate the left ventricular endocardial borders. The left ventricular internal cavity size was normal in size. There is moderate left ventricular hypertrophy. Left ventricular diastolic parameters are consistent with Grade II diastolic dysfunction (pseudonormalization). Elevated left atrial pressure. Right Ventricle: The right ventricular size is mildly enlarged. Right vetricular wall thickness was not well visualized. Right ventricular systolic function is normal. There is severely elevated pulmonary artery systolic pressure. The tricuspid regurgitant velocity is 3.61 m/s, and with an assumed right atrial pressure of 15 mmHg, the estimated right ventricular systolic pressure is [id number]. Left Atrium: Left atrial size was severely dilated. Right Atrium: Right atrial size was severely dilated. Pericardium: There is no evidence of pericardial effusion. Mitral Valve: The mitral valve is abnormal. There is mild thickening of the mitral valve leaflet(s). There is mild calcification of the mitral valve leaflet(s). Mild mitral annular calcification. Trivial mitral valve regurgitation. MV peak  gradient, 6.9 mmHg. The mean mitral valve gradient is 2.0 mmHg. Tricuspid Valve: The tricuspid valve is abnormal. Tricuspid valve regurgitation is mild . No evidence of tricuspid stenosis. Aortic Valve: The aortic valve has an indeterminant number of cusps. Aortic valve regurgitation is not visualized. No aortic stenosis is present. Aortic valve mean gradient measures 4.7 mmHg. Aortic valve peak gradient measures 10.8 mmHg. Aortic valve area, by VTI measures 2.29 cm. Pulmonic Valve: The pulmonic valve was not well visualized. Pulmonic valve regurgitation is not visualized. No evidence of pulmonic stenosis. Aorta: The aortic root is normal in size and structure. Venous: The inferior vena cava is dilated in size with less than 50% respiratory variability, suggesting  right atrial pressure of 15 mmHg. IAS/Shunts: No atrial level shunt detected by color flow Doppler.  LEFT VENTRICLE PLAX 2D LVIDd:         5.80 cm   Diastology LVIDs:         4.30 cm   LV e' medial:    5.11 cm/s LV PW:         1.30 cm   LV E/e' medial:  26.8 LV IVS:        1.30 cm   LV e' lateral:   12.10 cm/s LVOT diam:     2.00 cm   LV E/e' lateral: 11.3 LV SV:         73 LV SV Index:   35 LVOT Area:     3.14 cm  RIGHT VENTRICLE RV S prime:     8.81 cm/s TAPSE (M-mode): 1.8 cm LEFT ATRIUM              Index        RIGHT ATRIUM           Index LA diam:        5.40 cm  2.59 cm/m   RA Area:     31.70 cm LA Vol (A2C):   141.0 ml 67.69 ml/m  RA Volume:   125.00 ml 60.01 ml/m LA Vol (A4C):   145.0 ml 69.62 ml/m LA Biplane Vol: 148.0 ml 71.06 ml/m  AORTIC VALVE AV Area (Vmax):    1.97 cm AV Area (Vmean):   2.22 cm AV Area (VTI):     2.29 cm AV Vmax:           164.56 cm/s AV Vmean:          97.590 cm/s AV VTI:            0.316 m AV Peak Grad:      10.8 mmHg AV Mean Grad:      4.7 mmHg LVOT Vmax:         103.00 cm/s LVOT Vmean:        69.000 cm/s LVOT VTI:          0.231 m LVOT/AV VTI ratio: 0.73  AORTA Ao Root diam: 3.50 cm MITRAL VALVE                 TRICUSPID VALVE MV Area (PHT): 4.68 cm     TR Peak grad:   52.1 mmHg MV Area VTI:   2.15 cm     TR Vmax:        361.00 cm/s MV Peak grad:  6.9 mmHg MV Mean grad:  2.0 mmHg     SHUNTS MV Vmax:       1.31 m/s     Systemic VTI:  0.23 m MV Vmean:      64.8 cm/s    Systemic Diam: 2.00 cm MV Decel Time: 162 msec MV E velocity: 137.00 cm/s MV A velocity: 84.80 cm/s MV E/A ratio:  1.62 Carlyle Dolly MD Electronically signed by Carlyle Dolly MD Signature Date/Time: 08/18/2022/12:22:42 PM    Final    CT ABDOMEN PELVIS W CONTRAST  Result Date: 08/18/2022 CLINICAL DATA:  Chest pain. Nausea, vomiting, diarrhea. History of gastric and esophageal carcinoma. EXAM: CT ANGIOGRAPHY CHEST CT ABDOMEN AND PELVIS WITH CONTRAST TECHNIQUE: Multidetector CT imaging of the chest was performed using the standard protocol during bolus administration of intravenous contrast. Multiplanar CT image reconstructions and MIPs were obtained to evaluate the vascular anatomy. Multidetector CT imaging of the  abdomen and pelvis was performed using the standard protocol during bolus administration of intravenous contrast. RADIATION DOSE REDUCTION: This exam was performed according to the departmental dose-optimization program which includes automated exposure control, adjustment of the mA and/or kV according to patient size and/or use of iterative reconstruction technique. CONTRAST:  143m OMNIPAQUE IOHEXOL 350 MG/ML SOLN COMPARISON:  May 19, 2022. FINDINGS: CTA CHEST FINDINGS Cardiovascular: Satisfactory opacification of the pulmonary arteries to the segmental level. No evidence of pulmonary embolism. Normal heart size. No pericardial effusion. Status post coronary artery bypass graft. Mediastinum/Nodes: No enlarged mediastinal, hilar, or axillary lymph nodes. Thyroid gland, trachea, and esophagus demonstrate no significant findings. Lungs/Pleura: No pneumothorax is noted. Moderate right pleural effusion is noted with minimal adjacent  subsegmental atelectasis of the right lower lobe. Left lung is unremarkable. Musculoskeletal: Nondisplaced fracture is seen involving the posterior portion of the left sixth rib. There appears to be a moderate fluid collection overlying the lateral and posterior portions of the left chest wall as well. Review of the MIP images confirms the above findings. CT ABDOMEN and PELVIS FINDINGS Hepatobiliary: No focal liver abnormality is seen. No gallstones, gallbladder wall thickening, or biliary dilatation. Pancreas: Unremarkable. No pancreatic ductal dilatation or surrounding inflammatory changes. Spleen: Normal in size without focal abnormality. Adrenals/Urinary Tract: Adrenal glands appear normal. Bilateral renal cysts are noted for which no further follow-up is required. Mild bilateral renal atrophy is noted. No hydronephrosis or renal obstruction is noted. Urinary bladder is unremarkable. Stomach/Bowel: Stomach is within normal limits. Appendix appears normal. No evidence of bowel wall thickening, distention, or inflammatory changes. Diverticulosis of descending and sigmoid colon is noted without inflammation. Vascular/Lymphatic: Aortic atherosclerosis. No enlarged abdominal or pelvic lymph nodes. Reproductive: Stable mild prostatic enlargement is noted. Other: Small fat containing periumbilical hernia. No ascites is noted. Musculoskeletal: No acute or significant osseous findings. Review of the MIP images confirms the above findings. IMPRESSION: No definite evidence of pulmonary embolus. Status post coronary artery bypass graft. Moderate size right pleural effusion is noted with minimal adjacent subsegmental atelectasis of the right lower lobe. Nondisplaced left sixth rib fracture. Moderate sized fluid collection is seen overlying the lateral and posterior aspects of the left chest wall. Diverticulosis of descending and sigmoid colon without inflammation. Mild bilateral renal atrophy. Stable mild prostatic  enlargement. Aortic Atherosclerosis (ICD10-I70.0). Electronically Signed   By: JMarijo ConceptionM.D.   On: 08/18/2022 12:09   CT Angio Chest PE W and/or Wo Contrast  Result Date: 08/18/2022 CLINICAL DATA:  Chest pain. Nausea, vomiting, diarrhea. History of gastric and esophageal carcinoma. EXAM: CT ANGIOGRAPHY CHEST CT ABDOMEN AND PELVIS WITH CONTRAST TECHNIQUE: Multidetector CT imaging of the chest was performed using the standard protocol during bolus administration of intravenous contrast. Multiplanar CT image reconstructions and MIPs were obtained to evaluate the vascular anatomy. Multidetector CT imaging of the abdomen and pelvis was performed using the standard protocol during bolus administration of intravenous contrast. RADIATION DOSE REDUCTION: This exam was performed according to the departmental dose-optimization program which includes automated exposure control, adjustment of the mA and/or kV according to patient size and/or use of iterative reconstruction technique. CONTRAST:  1077mOMNIPAQUE IOHEXOL 350 MG/ML SOLN COMPARISON:  May 19, 2022. FINDINGS: CTA CHEST FINDINGS Cardiovascular: Satisfactory opacification of the pulmonary arteries to the segmental level. No evidence of pulmonary embolism. Normal heart size. No pericardial effusion. Status post coronary artery bypass graft. Mediastinum/Nodes: No enlarged mediastinal, hilar, or axillary lymph nodes. Thyroid gland, trachea, and esophagus demonstrate no significant findings.  Lungs/Pleura: No pneumothorax is noted. Moderate right pleural effusion is noted with minimal adjacent subsegmental atelectasis of the right lower lobe. Left lung is unremarkable. Musculoskeletal: Nondisplaced fracture is seen involving the posterior portion of the left sixth rib. There appears to be a moderate fluid collection overlying the lateral and posterior portions of the left chest wall as well. Review of the MIP images confirms the above findings. CT ABDOMEN and  PELVIS FINDINGS Hepatobiliary: No focal liver abnormality is seen. No gallstones, gallbladder wall thickening, or biliary dilatation. Pancreas: Unremarkable. No pancreatic ductal dilatation or surrounding inflammatory changes. Spleen: Normal in size without focal abnormality. Adrenals/Urinary Tract: Adrenal glands appear normal. Bilateral renal cysts are noted for which no further follow-up is required. Mild bilateral renal atrophy is noted. No hydronephrosis or renal obstruction is noted. Urinary bladder is unremarkable. Stomach/Bowel: Stomach is within normal limits. Appendix appears normal. No evidence of bowel wall thickening, distention, or inflammatory changes. Diverticulosis of descending and sigmoid colon is noted without inflammation. Vascular/Lymphatic: Aortic atherosclerosis. No enlarged abdominal or pelvic lymph nodes. Reproductive: Stable mild prostatic enlargement is noted. Other: Small fat containing periumbilical hernia. No ascites is noted. Musculoskeletal: No acute or significant osseous findings. Review of the MIP images confirms the above findings. IMPRESSION: No definite evidence of pulmonary embolus. Status post coronary artery bypass graft. Moderate size right pleural effusion is noted with minimal adjacent subsegmental atelectasis of the right lower lobe. Nondisplaced left sixth rib fracture. Moderate sized fluid collection is seen overlying the lateral and posterior aspects of the left chest wall. Diverticulosis of descending and sigmoid colon without inflammation. Mild bilateral renal atrophy. Stable mild prostatic enlargement. Aortic Atherosclerosis (ICD10-I70.0). Electronically Signed   By: Marijo Conception M.D.   On: 08/18/2022 12:09   DG Chest Portable 1 View  Result Date: 08/18/2022 CLINICAL DATA:  Chest pain, shortness of breath EXAM: PORTABLE CHEST 1 VIEW COMPARISON:  Previous studies including the examination of 06/23/2022 FINDINGS: Transverse diameter of heart is increased. There  is previous coronary bypass surgery. There is interval removal of right IJ dialysis catheter. Central pulmonary vessels are prominent. There is slight increase in interstitial markings. There is a small to moderate right pleural effusion with interval decrease. Left lateral CP angle is clear. There is no pneumothorax. IMPRESSION: Cardiomegaly. Central pulmonary vessels are prominent. There is prominence of interstitial markings suggesting possible mild interstitial edema. There are no signs of alveolar pulmonary edema. Increased density in right lower lung fields may suggest pleural effusion and underlying atelectasis. Electronically Signed   By: Elmer Picker M.D.   On: 08/18/2022 08:52    Procedures Procedures   Medications Ordered in ED Medications  amiodarone (NEXTERONE) 1.8 mg/mL load via infusion 150 mg (150 mg Intravenous Bolus from Bag 08/18/22 0906)    Followed by  amiodarone (NEXTERONE PREMIX) 360-4.14 MG/200ML-% (1.8 mg/mL) IV infusion (0 mg/hr Intravenous Stopped 08/18/22 1607)  etomidate (AMIDATE) injection 10 mg (0 mg Intravenous Hold 08/18/22 0937)  norepinephrine (LEVOPHED) '4mg'$  in 293m (0.016 mg/mL) premix infusion (0 mcg/min Intravenous Stopped 08/18/22 1015)  Chlorhexidine Gluconate Cloth 2 % PADS 6 each (6 each Topical Given 08/19/22 0810)  metoprolol succinate (TOPROL-XL) 24 hr tablet 50 mg (50 mg Oral Given 08/19/22 0809)  sertraline (ZOLOFT) tablet 50 mg (50 mg Oral Given 08/19/22 0808)  calcium acetate (PHOSLO) capsule 667 mg (667 mg Oral Given 08/19/22 1713)  pantoprazole (PROTONIX) EC tablet 40 mg (40 mg Oral Given 08/19/22 0809)  acetaminophen (TYLENOL) tablet 650 mg (has no administration  in time range)    Or  acetaminophen (TYLENOL) suppository 650 mg (has no administration in time range)  ondansetron (ZOFRAN) tablet 4 mg (has no administration in time range)    Or  ondansetron (ZOFRAN) injection 4 mg (has no administration in time range)  insulin aspart (novoLOG) injection  0-5 Units ( Subcutaneous Not Given 08/18/22 2205)  insulin aspart (novoLOG) injection 0-6 Units (3 Units Subcutaneous Given 08/19/22 1713)  perflutren lipid microspheres (DEFINITY) IV suspension (3 mLs Intravenous Given 08/18/22 1015)  lidocaine HCl (PF) (XYLOCAINE) 2 % injection (has no administration in time range)  guaiFENesin-dextromethorphan (ROBITUSSIN DM) 100-10 MG/5ML syrup 5 mL (5 mLs Oral Given 08/19/22 0811)  Chlorhexidine Gluconate Cloth 2 % PADS 6 each (0 each Topical Duplicate [id number])  iron sucrose (VENOFER) 100 mg in sodium chloride 0.9 % 100 mL IVPB (0 mg Intravenous Stopped 08/19/22 1730)  amiodarone (PACERONE) tablet 200 mg (200 mg Oral Given 08/19/22 1039)  aspirin chewable tablet 162 mg (162 mg Oral Given 08/18/22 0828)  nitroGLYCERIN (NITROSTAT) SL tablet 0.4 mg (0.4 mg Sublingual Given 08/18/22 0828)  sodium chloride 0.9 % bolus 1,000 mL (0 mLs Intravenous Stopped 08/18/22 0910)  etomidate (AMIDATE) 2 MG/ML injection (20 mg  Given 08/18/22 0859)  potassium chloride SA (KLOR-CON M) CR tablet 40 mEq (40 mEq Oral Given 08/18/22 1022)  iohexol (OMNIPAQUE) 350 MG/ML injection 100 mL (100 mLs Intravenous Contrast Given 08/18/22 1129)  ondansetron (ZOFRAN) injection 4 mg (4 mg Intravenous Given 08/18/22 1038)    ED Course/ Medical Decision Making/ A&P Clinical Course as of 08/19/22 1846  Thu Aug 18, 2022  0901 Dr Harl Bowie from cardiology aware.  [RP]  0920 Dr Harl Bowie recommends STAT echo and continuing amiodarone.  [RP]  1252 Dr Glo Herring to admit the patient. [RP]    Clinical Course User Index [RP] Fransico Meadow, MD                             Medical Decision Making Amount and/or Complexity of Data Reviewed Labs: ordered. Radiology: ordered.  Risk OTC drugs. Prescription drug management. Decision regarding hospitalization.   VILLA HOLLRAH is a 67 y.o. male with comorbidities that complicate the patient evaluation including ESRD on IHD, esophageal cancer not currently on  treatment, CHF, CAD status post CABG on aspirin, and diabetes who presents emergency department with chest pain and shortness of breath.    Initial Ddx:  PE, MI, anemia, pneumonia/URI, volume overload  MDM:  Concern about possible PE or MI given the patient's chest pain and shortness of breath new oxygen requirement,.  Does not have infectious symptoms at this time so feel that pneumonia and URI are less likely.  Does also appear to be volume overloaded which may be related to his dialysis.  Plan:  Labs Troponin D-dimer BNP EKG Chest x-ray  ED Summary/Re-evaluation:  Patient was reassessed and went into a wide-complex tachycardia.  Appeared to be SVT with aberrancy and had runs of wide-complex rhythms that appeared to be nonsustained V. tach.  However, precaution patient was given amiodarone which can treat both conditions.  Did become hypotensive requiring Levophed for short period of time which resolved when the patient went back into a sustained normal sinus rhythm.  Did consider cardioverting the patient but he was very transiently in and out of the wide-complex rhythm and sinus rhythm so do not feel that cardioversion was warranted at that time.  Did have elevated D-dimer  and so CTA was obtained which showed an old rib fracture and pleural effusions.  Did add on a CT of the abdomen pelvis given the the patient's diffuse abdominal bruises and the fact he is starting to become hypotensive to rule out retroperitoneal hematoma or possible intra-abdominal bleeding.  It was negative for any acute findings.  Patient was discussed with cardiology who agreed with our management and patient was admitted to medicine for further workup.  Nephrology was consulted regarding dialysis and we are awaiting their response at this time.  This patient presents to the ED for concern of complaints listed in HPI, this involves an extensive number of treatment options, and is a complaint that carries with it a high  risk of complications and morbidity. Disposition including potential need for admission considered.   Dispo: Admit to Step Down  Additional history obtained from family Records reviewed Outpatient Clinic Notes The following labs were independently interpreted: Chemistry and show CKD I independently reviewed the following imaging with scope of interpretation limited to determining acute life threatening conditions related to emergency care: Chest x-ray and agree with the radiologist interpretation with the following exceptions: None I personally reviewed and interpreted cardiac monitoring: normal sinus rhythm  and SVT and NSVT I personally reviewed and interpreted the pt's EKG: see above for interpretation  I have reviewed the patients home medications and made adjustments as needed Consults: Cardiology  Final Clinical Impression(s) / ED Diagnoses Final diagnoses:  Chest pain, unspecified type  Hypoxia  Hypervolemia, unspecified hypervolemia type  Hypotension, unspecified hypotension type  SVT (supraventricular tachycardia)    Rx / DC Orders ED Discharge Orders     None      CRITICAL CARE Performed by: Fransico Meadow   Total critical care time: 60 minutes  Critical care time was exclusive of separately billable procedures and treating other patients.  Critical care was necessary to treat or prevent imminent or life-threatening deterioration.  Critical care was time spent personally by me on the following activities: development of treatment plan with patient and/or surrogate as well as nursing, discussions with consultants, evaluation of patient's response to treatment, examination of patient, obtaining history from patient or surrogate, ordering and performing treatments and interventions, ordering and review of laboratory studies, ordering and review of radiographic studies, pulse oximetry and re-evaluation of patient's condition.    Fransico Meadow, MD 08/19/22  [phone]

## 2022-08-18 NOTE — ED Notes (Signed)
Pt only took one 20 mEq potassium pill.  States he can not tolerate taking the second one.  Reports nausea.

## 2022-08-18 NOTE — ED Notes (Signed)
Patient transported to CT 

## 2022-08-18 NOTE — Telephone Encounter (Signed)
Dr Nicki Reaper do you know where this form maybe?

## 2022-08-18 NOTE — Hospital Course (Addendum)
67 year old male with a history of coronary disease status post CABG, systolic and diastolic CHF, diabetes mellitus type 2, hypertension, ESRD, SVT, esophageal adenocarcinoma, and OSA presenting with substernal chest pain and shortness of breath.  The patient states that he began developing substernal chest discomfort around 2 AM on 08/18/2022 about 30 to 60 minutes after eating toast and jelly.  Notably, the patient has had intermittent nausea and vomiting for the past week.  He had 4 episodes of nonbloody emesis on 08/17/2022.  In addition, the patient has been complaining of some worsening shortness of breath this past week. He denies any fevers, chills, coughing, hemoptysis, hematemesis,.  He endorses compliance with dialysis on Monday, Wednesday, and Fridays.  His last dialysis was on Wednesday, 08/17/2022.  Because of his chest pain shortness of breath, the patient presented for further evaluation and treatment. Notably, the patient is supposed to be on 2 L nasal cannula at home, but he has been out of oxygen for a couple weeks.  The patient also has long history of gait instability and falls.  He states that he falls about once per week, last fall on 08/12/2022. The patient has had numerous hospital admissions.  Most recently he was hospitalized for about 24 hours on 06/23/2022 for atypical chest pain.  He was discharged home in stable condition.  He had another hospital admission from 05/27/2022 to 05/31/2022 for hematemesis.  EGD on 05/31/2023 showed moderate severe esophagitis without any active bleeding.  In the ED, the patient was afebrile.  He went into SVT and became hypotensive.  He was started on amiodarone drip.  He required Levophed for short period of time.  He spontaneously converted to sinus rhythm.  He was placed on 4 L nasal cannula with oxygen saturation 100%.  WBC 10.5, hemoglobin 8.6, platelets 216,000.  Sodium 133, potassium 3.6, bicarbonate 29, serum creatinine 3.53.  Lactic acid 1.6.  PCT  0.40.  D-dimer 2.83.  EKG initially showed SVT with nonspecific ST changes. CTA chest was negative for PE but showed a moderate right pleural effusion with atelectasis.  There is nondisplaced left sixth rib fracture.  There is also fluid collection in the left chest wall.  CT of the abdomen was negative for any acute findings.

## 2022-08-19 DIAGNOSIS — N186 End stage renal disease: Secondary | ICD-10-CM

## 2022-08-19 DIAGNOSIS — Z789 Other specified health status: Secondary | ICD-10-CM

## 2022-08-19 DIAGNOSIS — Z992 Dependence on renal dialysis: Secondary | ICD-10-CM

## 2022-08-19 DIAGNOSIS — D649 Anemia, unspecified: Secondary | ICD-10-CM

## 2022-08-19 DIAGNOSIS — I5042 Chronic combined systolic (congestive) and diastolic (congestive) heart failure: Secondary | ICD-10-CM

## 2022-08-19 DIAGNOSIS — J9621 Acute and chronic respiratory failure with hypoxia: Secondary | ICD-10-CM

## 2022-08-19 DIAGNOSIS — E7849 Other hyperlipidemia: Secondary | ICD-10-CM

## 2022-08-19 DIAGNOSIS — D62 Acute posthemorrhagic anemia: Secondary | ICD-10-CM | POA: Diagnosis not present

## 2022-08-19 DIAGNOSIS — I25810 Atherosclerosis of coronary artery bypass graft(s) without angina pectoris: Secondary | ICD-10-CM

## 2022-08-19 DIAGNOSIS — I471 Supraventricular tachycardia, unspecified: Secondary | ICD-10-CM | POA: Diagnosis not present

## 2022-08-19 LAB — RENAL FUNCTION PANEL
Albumin: 2.9 g/dL — ABNORMAL LOW (ref 3.5–5.0)
Anion gap: 14 (ref 5–15)
BUN: 28 mg/dL — ABNORMAL HIGH (ref 8–23)
CO2: 26 mmol/L (ref 22–32)
Calcium: 9.4 mg/dL (ref 8.9–10.3)
Chloride: 92 mmol/L — ABNORMAL LOW (ref 98–111)
Creatinine, Ser: 4.23 mg/dL — ABNORMAL HIGH (ref 0.61–1.24)
GFR, Estimated: 15 mL/min — ABNORMAL LOW (ref >60)
Glucose, Bld: 281 mg/dL — ABNORMAL HIGH (ref 70–99)
Phosphorus: 2.9 mg/dL (ref 2.5–4.6)
Potassium: 4 mmol/L (ref 3.5–5.1)
Sodium: 132 mmol/L — ABNORMAL LOW (ref 135–145)

## 2022-08-19 LAB — HEMOGLOBIN A1C
Hgb A1c MFr Bld: 8.1 % — ABNORMAL HIGH (ref 4.8–5.6)
Mean Plasma Glucose: 186 mg/dL

## 2022-08-19 LAB — MRSA NEXT GEN BY PCR, NASAL: MRSA by PCR Next Gen: NOT DETECTED

## 2022-08-19 LAB — GLUCOSE, CAPILLARY
Glucose-Capillary: 164 mg/dL — ABNORMAL HIGH (ref 70–99)
Glucose-Capillary: 232 mg/dL — ABNORMAL HIGH (ref 70–99)
Glucose-Capillary: 259 mg/dL — ABNORMAL HIGH (ref 70–99)
Glucose-Capillary: 154 mg/dL — ABNORMAL HIGH (ref 70–99)

## 2022-08-19 LAB — HEPATIC FUNCTION PANEL
ALT: 7 U/L (ref 0–44)
AST: 9 U/L — ABNORMAL LOW (ref 15–41)
Albumin: 2.9 g/dL — ABNORMAL LOW (ref 3.5–5.0)
Alkaline Phosphatase: 89 U/L (ref 38–126)
Bilirubin, Direct: 0.2 mg/dL (ref 0.0–0.2)
Indirect Bilirubin: 1 mg/dL — ABNORMAL HIGH (ref 0.3–0.9)
Total Bilirubin: 1.2 mg/dL (ref 0.3–1.2)
Total Protein: 5.8 g/dL — ABNORMAL LOW (ref 6.5–8.1)

## 2022-08-19 LAB — CBC
HCT: 28.5 % — ABNORMAL LOW (ref 39.0–52.0)
Hemoglobin: 8.6 g/dL — ABNORMAL LOW (ref 13.0–17.0)
MCH: 27.4 pg (ref 26.0–34.0)
MCHC: 30.2 g/dL (ref 30.0–36.0)
MCV: 90.8 fL (ref 80.0–100.0)
Platelets: 278 10*3/uL (ref 150–400)
RBC: 3.14 MIL/uL — ABNORMAL LOW (ref 4.22–5.81)
RDW: 20.6 % — ABNORMAL HIGH (ref 11.5–15.5)
WBC: 7.9 10*3/uL (ref 4.0–10.5)
nRBC: 0 % (ref 0.0–0.2)

## 2022-08-19 LAB — OCCULT BLOOD X 1 CARD TO LAB, STOOL: Fecal Occult Bld: NEGATIVE

## 2022-08-19 LAB — LACTATE DEHYDROGENASE: LDH: 168 U/L (ref 98–192)

## 2022-08-19 MED ORDER — SODIUM CHLORIDE 0.9 % IV SOLN
100.0000 mg | INTRAVENOUS | Status: DC
  Administered 2022-08-19: 100 mg via INTRAVENOUS
  Filled 2022-08-19 (×2): qty 5

## 2022-08-19 MED ORDER — AMIODARONE HCL 200 MG PO TABS
200.0000 mg | ORAL_TABLET | Freq: Two times a day (BID) | ORAL | Status: DC
  Administered 2022-08-19 – 2022-08-21 (×5): 200 mg via ORAL
  Filled 2022-08-19 (×5): qty 1

## 2022-08-19 MED ORDER — CHLORHEXIDINE GLUCONATE CLOTH 2 % EX PADS: 6.0000 | MEDICATED_PAD | Freq: Every day | CUTANEOUS | Status: DC

## 2022-08-19 NOTE — Evaluation (Addendum)
Occupational Therapy Evaluation Patient Details Name: [name] MRN: [id number] DOB: 16-Sep-1955 Today's Date: 08/19/2022   History of Present Illness IGNAC [name] is a  67 year old male with a history of coronary disease status post CABG, systolic and diastolic CHF, diabetes mellitus type 2, hypertension, ESRD, SVT, esophageal adenocarcinoma, and OSA presenting with substernal chest pain and shortness of breath.  The patient states that he began developing substernal chest discomfort around 2 AM on 08/18/2022 about 30 to 60 minutes after eating toast and jelly.  Notably, the patient has had intermittent nausea and vomiting for the past week.  He had 4 episodes of nonbloody emesis on 08/17/2022.  In addition, the patient has been complaining of some worsening shortness of breath this past week.  He denies any fevers, chills, coughing, hemoptysis, hematemesis,.  He endorses compliance with dialysis on Monday, Wednesday, and Fridays.  His last dialysis was on Wednesday, 08/17/2022.  Because of his chest pain shortness of breath, the patient presented for further evaluation and treatment.  Notably, the patient is supposed to be on 2 L nasal cannula at home, but he has been out of oxygen for a couple weeks.  The patient also has long history of gait instability and falls.  He states that he falls about once per week, last fall on 08/12/2022.  The patient has had numerous hospital admissions.  Most recently he was hospitalized for about 24 hours on 06/23/2022 for atypical chest pain.  He was discharged home in stable condition.  He had another hospital admission from 05/27/2022 to 05/31/2022 for hematemesis.  EGD on 05/31/2023 showed moderate severe esophagitis without any active bleeding. (Per MD)   Clinical Impression   Pt agreeable to OT and PT co-evaluation. Pt reports 4 to 5 fall in the past 6 months. Pt reports that he has nobody who will stay with him for a day or two for 24 hours a day. Pt reports he  does not have a bed on the first floor and must go up to the second floor of his home. Reports he is working on making the 1st level livable. Today pt was generally weak and required supervision to min G assist for mobility out of bed. Pt was able to don socks seated at EOB with set up assist. Given pt's amount of falls, he would be a very high fall risk returning home in current level of function and with the lack of support he has. Pt was also noted to plop down in the chair when returning from ambulation in the room. Supplemental O2 was removed and pt desaturated to 85% briefly but rose to 92% SpO2 by the end of session. O2 was left off of pt. Pt will benefit from continued OT in the hospital and recommended venue below to increase strength, balance, and endurance for safe ADL's.         Recommendations for follow up therapy are one component of a multi-disciplinary discharge planning process, led by the attending physician.  Recommendations may be updated based on patient status, additional functional criteria and insurance authorization.   Follow Up Recommendations  Skilled nursing-short term rehab (<3 hours/day)     Assistance Recommended at Discharge Intermittent Supervision/Assistance  Patient can return home with the following A little help with walking and/or transfers;A little help with bathing/dressing/bathroom;Assistance with cooking/housework;Assist for transportation;Help with stairs or ramp for entrance    Functional Status Assessment  Patient has had a recent decline in their functional status and demonstrates the  ability to make significant improvements in function in a reasonable and predictable amount of time.  Equipment Recommendations  None recommended by OT    Recommendations for Other Services       Precautions / Restrictions Precautions Precautions: Fall Restrictions Weight Bearing Restrictions: No      Mobility Bed Mobility Overal bed mobility: Modified  Independent             General bed mobility comments: Mild labored movement. HOB elevated to simulate bed at home with pillows.    Transfers Overall transfer level: Needs assistance Equipment used: Rolling walker (2 wheels) Transfers: Sit to/from Stand, Bed to chair/wheelchair/BSC Sit to Stand: Min guard     Step pivot transfers: Supervision, Min guard     General transfer comment: Mildly unsteady in standing with RW. Has history of 4 to 5 falls in 6 months. Pt reports he fell on his stairs and fractured his ribs.      Balance Overall balance assessment: Needs assistance Sitting-balance support: No upper extremity supported, Feet supported Sitting balance-Leahy Scale: Good Sitting balance - Comments: seated at EOB   Standing balance support: Bilateral upper extremity supported, During functional activity, Reliant on assistive device for balance Standing balance-Leahy Scale: Fair Standing balance comment: using RW                           ADL either performed or assessed with clinical judgement   ADL Overall ADL's : Needs assistance/impaired     Grooming: Set up;Sitting   Upper Body Bathing: Set up;Sitting   Lower Body Bathing: Set up;Sitting/lateral leans   Upper Body Dressing : Set up;Sitting   Lower Body Dressing: Set up;Sitting/lateral leans   Toilet Transfer: Min guard;Supervision/safety;Rolling walker (2 wheels);Ambulation Toilet Transfer Details (indicate cue type and reason): Simulated via EOB to chair and ambulation in the room. Toileting- Clothing Manipulation and Hygiene: Set up;Min guard;Sitting/lateral lean       Functional mobility during ADLs: Min guard;Supervision/safety;Rolling walker (2 wheels)       Vision Baseline Vision/History: 1 Wears glasses (L eye blurry at baseline) Ability to See in Adequate Light: 1 Impaired Patient Visual Report: No change from baseline Vision Assessment?: No apparent visual deficits                 Pertinent Vitals/Pain Pain Assessment Pain Assessment: No/denies pain     Hand Dominance Right   Extremity/Trunk Assessment Upper Extremity Assessment Upper Extremity Assessment: Generalized weakness   Lower Extremity Assessment Lower Extremity Assessment: Defer to PT evaluation   Cervical / Trunk Assessment Cervical / Trunk Assessment: Kyphotic   Communication Communication Communication: No difficulties   Cognition Arousal/Alertness: Awake/alert Behavior During Therapy: WFL for tasks assessed/performed Overall Cognitive Status: Within Functional Limits for tasks assessed                                                        Home Living Family/patient expects to be discharged to:: Private residence Living Arrangements: Alone Available Help at Discharge: Friend(s);Available PRN/intermittently Type of Home: House Home Access: Stairs to enter [employer] of Steps: 6 Entrance Stairs-Rails: Right Home Layout: Multi-level Alternate Level Stairs-Number of Steps: 6 + 6 Alternate Level Stairs-Rails: Left Bathroom Shower/Tub: Occupational psychologist: Standard     Home  Equipment: Grab bars - tub/shower;Rolling Walker (2 wheels);Cane - quad   Additional Comments: Pt reports no change in home set up other than that he no longer has care attendants.      Prior Functioning/Environment Prior Level of Function : Needs assist;History of Falls (last six months)       Physical Assist : Mobility (physical);ADLs (physical)   ADLs (physical): IADLs Mobility Comments: Household ambulator using RW, uses wheelchair for longer distances ADLs Comments: Indepndent for ADL's; assisted by friends for IADL's.        OT Problem List: Decreased strength;Decreased activity tolerance;Impaired balance (sitting and/or standing)      OT Treatment/Interventions: Self-care/ADL training;Therapeutic exercise;Therapeutic activities;Balance  training    OT Goals(Current goals can be found in the care plan section) Acute Rehab OT Goals Patient Stated Goal: return home OT Goal Formulation: With patient Time For Goal Achievement: 09/02/22 Potential to Achieve Goals: Good  OT Frequency: Min 1X/week    Co-evaluation PT/OT/SLP Co-Evaluation/Treatment: Yes Reason for Co-Treatment: To address functional/ADL transfers   OT goals addressed during session: ADL's and self-care                       End of Session Equipment Utilized During Treatment: Rolling walker (2 wheels)  Activity Tolerance: Patient tolerated treatment well Patient left: in chair;with call bell/phone within reach  OT Visit Diagnosis: Unsteadiness on feet (R26.81);Other abnormalities of gait and mobility (R26.89);Muscle weakness (generalized) (M62.81)                Time: [id number] OT Time Calculation (min): 15 min Charges:  OT General Charges $OT Visit: 1 Visit OT Evaluation $OT Eval Low Complexity: 1 Low  Yaniah Thiemann OT, MOT  Larey Seat 08/19/2022, 10:04 AM

## 2022-08-19 NOTE — Progress Notes (Signed)
Progress Note  Patient Name: [name] Date of Encounter: 08/19/2022  Primary Cardiologist: Skeet Latch, MD  Subjective   No acute events overnight, currently on amnio drip for paroxysmal SVT with aberrancy.  Telemetry reviewed, rate controlled. No symptoms.  Inpatient Medications    Scheduled Meds:  calcium acetate  667 mg Oral TID WC   Chlorhexidine Gluconate Cloth  6 each Topical Q0600   Chlorhexidine Gluconate Cloth  6 each Topical Q0600   etomidate  10 mg Intravenous Once   insulin aspart  0-5 Units Subcutaneous QHS   insulin aspart  0-6 Units Subcutaneous TID WC   metoprolol succinate  50 mg Oral Daily   pantoprazole  40 mg Oral BID   sertraline  50 mg Oral Daily   Continuous Infusions:  amiodarone 30 mg/hr (08/19/22 0807)   iron sucrose     norepinephrine (LEVOPHED) Adult infusion Stopped (08/18/22 1015)   PRN Meds: acetaminophen **OR** acetaminophen, guaiFENesin-dextromethorphan, ondansetron **OR** ondansetron (ZOFRAN) IV   Vital Signs    Vitals:   08/19/22 0900 08/19/22 0924 08/19/22 0930 08/19/22 1000  BP: (!) 127/57  (!) 127/59 (!) 106/44  Pulse: 73 69 71 72  Resp: (!) 29 19 (!) 28 (!) 27  Temp:      TempSrc:      SpO2: 92% 96% 94% 94%  Weight:      Height:        Intake/Output Summary (Last 24 hours) at 08/19/2022 1014 Last data filed at 08/19/2022 0744 Gross per 24 hour  Intake 580.35 ml  Output 300 ml  Net 280.35 ml   Filed Weights   08/18/22 0806 08/18/22 1400  Weight: 90.3 kg 89.5 kg    Telemetry     Personally reviewed, NSR aberrancy, rates controlled  ECG   NSR  Physical Exam   GEN: No acute distress.   Neck: No JVD. Cardiac: RRR, no murmur, rub, or gallop.  Respiratory: Nonlabored. Clear to auscultation bilaterally. GI: Soft, nontender, bowel sounds present. MS: No edema; No deformity. Neuro:  Nonfocal. Psych: Alert and oriented x 3. Normal affect.  Labs    Chemistry Recent Labs  Lab 08/18/22 0849  08/19/22 0416 08/19/22 0500  NA 133*  --  132*  K 3.6  --  4.0  CL 92*  --  92*  CO2 29  --  26  GLUCOSE 321*  --  281*  BUN 25*  --  28*  CREATININE 3.53*  --  4.23*  CALCIUM 10.1  --  9.4  PROT 6.3* 5.8*  --   ALBUMIN 3.2* 2.9* 2.9*  AST 12* 9*  --   ALT 8 7  --   ALKPHOS 113 89  --   BILITOT 1.1 1.2  --   GFRNONAA 18*  --  15*  ANIONGAP 12  --  14     Hematology Recent Labs  Lab 08/18/22 0849 08/19/22 0416  WBC 10.5 7.9  RBC 3.11* 3.14*  HGB 8.6* 8.6*  HCT 28.0* 28.5*  MCV 90.0 90.8  MCH 27.7 27.4  MCHC 30.7 30.2  RDW 20.6* 20.6*  PLT 316 278    Cardiac Enzymes Recent Labs  Lab 08/18/22 0849 08/18/22 1002 08/18/22 1057  TROPONINIHS 23* 23* 20*    BNP Recent Labs  Lab 08/18/22 0845  BNP 3,202.0*     DDimer Recent Labs  Lab 08/18/22 0849  DDIMER 2.83*     Radiology    US THORACENTESIS ASP PLEURAL SPACE W/IMG GUIDE  Result  Date: 08/18/2022 INDICATION: Patient with a history of heart failure and gastric/esophageal cancer with right pleural effusion. Interventional Radiology asked to perform a diagnostic and therapeutic thoracentesis. EXAM: ULTRASOUND GUIDED THORACENTESIS MEDICATIONS: None. COMPLICATIONS: None immediate. PROCEDURE: An ultrasound guided thoracentesis was thoroughly discussed with the patient and questions answered. The benefits, risks, alternatives and complications were also discussed. The patient understands and wishes to proceed with the procedure. Written consent was obtained. Ultrasound was performed to localize and mark an adequate pocket of fluid in the right chest. The area was then prepped and draped in the normal sterile fashion. 1% Lidocaine was used for local anesthesia. Under ultrasound guidance a 6 Fr Safe-T-Centesis catheter was introduced. Thoracentesis was performed. The catheter was removed and a dressing applied. FINDINGS: A total of approximately 1.2 L of clear yellow fluid was removed. Samples were sent to the  laboratory as requested by the clinical team. IMPRESSION: Successful ultrasound guided right thoracentesis yielding 1.2 L of pleural fluid. Read by: Soyla Dryer, NP Electronically Signed   By: Titus Dubin M.D.   On: 08/18/2022 16:35   DG Chest Port 1 View  Result Date: 08/18/2022 CLINICAL DATA:  Status post right thoracentesis EXAM: PORTABLE CHEST 1 VIEW COMPARISON:  CT chest 08/18/2022 and chest radiograph 08/18/2022 FINDINGS: Previous right pleural effusion no longer readily apparent. No visible pneumothorax. Moderate enlargement of the cardiopericardial silhouette. Prior CABG. Indistinct pulmonary vasculature may reflect pulmonary venous hypertension but there is no overt edema. IMPRESSION: 1. Previous right pleural effusion no longer readily apparent. No pneumothorax. 2. Moderate enlargement of the cardiopericardial silhouette with potential pulmonary venous hypertension but no overt edema. 3. Prior CABG. Electronically Signed   By: Van Clines M.D.   On: 08/18/2022 15:34   ECHOCARDIOGRAM COMPLETE  Result Date: 08/18/2022    ECHOCARDIOGRAM REPORT   Patient Name:   [name] Date of Exam: 08/18/2022 Medical Rec #:  [id number]        Height:       70.0 in Accession #:    [id number]       Weight:       199.0 lb Date of Birth:  1956-04-03        BSA:          2.083 m Patient Age:    67 years         BP:           154/73 mmHg Patient Gender: M                HR:           82 bpm. Exam Location:  [facility] Procedure: 2D Echo, Cardiac Doppler and Color Doppler STAT ECHO Indications:    Chest Pain R07.9  History:        Patient has prior history of Echocardiogram examinations, most                 recent 02/19/2022. CHF and Cardiomyopathy, CAD, Prior CABG,                 Stroke, Arrythmias:Tachycardia, Signs/Symptoms:Shortness of                 Breath and Chest Pain; Risk Factors:Hypertension, Diabetes and                 Dyslipidemia.  Sonographer:    Alvino Chapel RCS Referring Phys: [id number]  [place]  1. Left ventricular ejection fraction, by estimation, is 50%. The left  ventricle has low normal function. The left ventricle has no regional wall motion abnormalities. There is moderate left ventricular hypertrophy. Left ventricular diastolic parameters  are consistent with Grade II diastolic dysfunction (pseudonormalization). Elevated left atrial pressure.  2. Right ventricular systolic function is normal. The right ventricular size is mildly enlarged. There is severely elevated pulmonary artery systolic pressure.  3. Left atrial size was severely dilated.  4. Right atrial size was severely dilated.  5. The mitral valve is abnormal. Trivial mitral valve regurgitation.  6. The tricuspid valve is abnormal.  7. The aortic valve has an indeterminant number of cusps. Aortic valve regurgitation is not visualized. No aortic stenosis is present.  8. The inferior vena cava is dilated in size with <50% respiratory variability, suggesting right atrial pressure of 15 mmHg. FINDINGS  Left Ventricle: Left ventricular ejection fraction, by estimation, is 50%. The left ventricle has low normal function. The left ventricle has no regional wall motion abnormalities. Definity contrast agent was given IV to delineate the left ventricular endocardial borders. The left ventricular internal cavity size was normal in size. There is moderate left ventricular hypertrophy. Left ventricular diastolic parameters are consistent with Grade II diastolic dysfunction (pseudonormalization). Elevated left atrial pressure. Right Ventricle: The right ventricular size is mildly enlarged. Right vetricular wall thickness was not well visualized. Right ventricular systolic function is normal. There is severely elevated pulmonary artery systolic pressure. The tricuspid regurgitant velocity is 3.61 m/s, and with an assumed right atrial pressure of 15 mmHg, the estimated right ventricular systolic pressure is [id number]. Left  Atrium: Left atrial size was severely dilated. Right Atrium: Right atrial size was severely dilated. Pericardium: There is no evidence of pericardial effusion. Mitral Valve: The mitral valve is abnormal. There is mild thickening of the mitral valve leaflet(s). There is mild calcification of the mitral valve leaflet(s). Mild mitral annular calcification. Trivial mitral valve regurgitation. MV peak gradient, 6.9 mmHg. The mean mitral valve gradient is 2.0 mmHg. Tricuspid Valve: The tricuspid valve is abnormal. Tricuspid valve regurgitation is mild . No evidence of tricuspid stenosis. Aortic Valve: The aortic valve has an indeterminant number of cusps. Aortic valve regurgitation is not visualized. No aortic stenosis is present. Aortic valve mean gradient measures 4.7 mmHg. Aortic valve peak gradient measures 10.8 mmHg. Aortic valve area, by VTI measures 2.29 cm. Pulmonic Valve: The pulmonic valve was not well visualized. Pulmonic valve regurgitation is not visualized. No evidence of pulmonic stenosis. Aorta: The aortic root is normal in size and structure. Venous: The inferior vena cava is dilated in size with less than 50% respiratory variability, suggesting right atrial pressure of 15 mmHg. IAS/Shunts: No atrial level shunt detected by color flow Doppler.  LEFT VENTRICLE PLAX 2D LVIDd:         5.80 cm   Diastology LVIDs:         4.30 cm   LV e' medial:    5.11 cm/s LV PW:         1.30 cm   LV E/e' medial:  26.8 LV IVS:        1.30 cm   LV e' lateral:   12.10 cm/s LVOT diam:     2.00 cm   LV E/e' lateral: 11.3 LV SV:         73 LV SV Index:   35 LVOT Area:     3.14 cm  RIGHT VENTRICLE RV S prime:     8.81 cm/s TAPSE (M-mode): 1.8 cm LEFT ATRIUM  Index        RIGHT ATRIUM           Index LA diam:        5.40 cm  2.59 cm/m   RA Area:     31.70 cm LA Vol (A2C):   141.0 ml 67.69 ml/m  RA Volume:   125.00 ml 60.01 ml/m LA Vol (A4C):   145.0 ml 69.62 ml/m LA Biplane Vol: 148.0 ml 71.06 ml/m  AORTIC VALVE  AV Area (Vmax):    1.97 cm AV Area (Vmean):   2.22 cm AV Area (VTI):     2.29 cm AV Vmax:           164.56 cm/s AV Vmean:          97.590 cm/s AV VTI:            0.316 m AV Peak Grad:      10.8 mmHg AV Mean Grad:      4.7 mmHg LVOT Vmax:         103.00 cm/s LVOT Vmean:        69.000 cm/s LVOT VTI:          0.231 m LVOT/AV VTI ratio: 0.73  AORTA Ao Root diam: 3.50 cm MITRAL VALVE                TRICUSPID VALVE MV Area (PHT): 4.68 cm     TR Peak grad:   52.1 mmHg MV Area VTI:   2.15 cm     TR Vmax:        361.00 cm/s MV Peak grad:  6.9 mmHg MV Mean grad:  2.0 mmHg     SHUNTS MV Vmax:       1.31 m/s     Systemic VTI:  0.23 m MV Vmean:      64.8 cm/s    Systemic Diam: 2.00 cm MV Decel Time: 162 msec MV E velocity: 137.00 cm/s MV A velocity: 84.80 cm/s MV E/A ratio:  1.62 Carlyle Dolly MD Electronically signed by Carlyle Dolly MD Signature Date/Time: 08/18/2022/12:22:42 PM    Final    CT ABDOMEN PELVIS W CONTRAST  Result Date: 08/18/2022 CLINICAL DATA:  Chest pain. Nausea, vomiting, diarrhea. History of gastric and esophageal carcinoma. EXAM: CT ANGIOGRAPHY CHEST CT ABDOMEN AND PELVIS WITH CONTRAST TECHNIQUE: Multidetector CT imaging of the chest was performed using the standard protocol during bolus administration of intravenous contrast. Multiplanar CT image reconstructions and MIPs were obtained to evaluate the vascular anatomy. Multidetector CT imaging of the abdomen and pelvis was performed using the standard protocol during bolus administration of intravenous contrast. RADIATION DOSE REDUCTION: This exam was performed according to the departmental dose-optimization program which includes automated exposure control, adjustment of the mA and/or kV according to patient size and/or use of iterative reconstruction technique. CONTRAST:  120m OMNIPAQUE IOHEXOL 350 MG/ML SOLN COMPARISON:  May 19, 2022. FINDINGS: CTA CHEST FINDINGS Cardiovascular: Satisfactory opacification of the pulmonary arteries to the  segmental level. No evidence of pulmonary embolism. Normal heart size. No pericardial effusion. Status post coronary artery bypass graft. Mediastinum/Nodes: No enlarged mediastinal, hilar, or axillary lymph nodes. Thyroid gland, trachea, and esophagus demonstrate no significant findings. Lungs/Pleura: No pneumothorax is noted. Moderate right pleural effusion is noted with minimal adjacent subsegmental atelectasis of the right lower lobe. Left lung is unremarkable. Musculoskeletal: Nondisplaced fracture is seen involving the posterior portion of the left sixth rib. There appears to be a moderate fluid collection overlying the lateral and posterior  portions of the left chest wall as well. Review of the MIP images confirms the above findings. CT ABDOMEN and PELVIS FINDINGS Hepatobiliary: No focal liver abnormality is seen. No gallstones, gallbladder wall thickening, or biliary dilatation. Pancreas: Unremarkable. No pancreatic ductal dilatation or surrounding inflammatory changes. Spleen: Normal in size without focal abnormality. Adrenals/Urinary Tract: Adrenal glands appear normal. Bilateral renal cysts are noted for which no further follow-up is required. Mild bilateral renal atrophy is noted. No hydronephrosis or renal obstruction is noted. Urinary bladder is unremarkable. Stomach/Bowel: Stomach is within normal limits. Appendix appears normal. No evidence of bowel wall thickening, distention, or inflammatory changes. Diverticulosis of descending and sigmoid colon is noted without inflammation. Vascular/Lymphatic: Aortic atherosclerosis. No enlarged abdominal or pelvic lymph nodes. Reproductive: Stable mild prostatic enlargement is noted. Other: Small fat containing periumbilical hernia. No ascites is noted. Musculoskeletal: No acute or significant osseous findings. Review of the MIP images confirms the above findings. IMPRESSION: No definite evidence of pulmonary embolus. Status post coronary artery bypass graft.  Moderate size right pleural effusion is noted with minimal adjacent subsegmental atelectasis of the right lower lobe. Nondisplaced left sixth rib fracture. Moderate sized fluid collection is seen overlying the lateral and posterior aspects of the left chest wall. Diverticulosis of descending and sigmoid colon without inflammation. Mild bilateral renal atrophy. Stable mild prostatic enlargement. Aortic Atherosclerosis (ICD10-I70.0). Electronically Signed   By: Marijo Conception M.D.   On: 08/18/2022 12:09   CT Angio Chest PE W and/or Wo Contrast  Result Date: 08/18/2022 CLINICAL DATA:  Chest pain. Nausea, vomiting, diarrhea. History of gastric and esophageal carcinoma. EXAM: CT ANGIOGRAPHY CHEST CT ABDOMEN AND PELVIS WITH CONTRAST TECHNIQUE: Multidetector CT imaging of the chest was performed using the standard protocol during bolus administration of intravenous contrast. Multiplanar CT image reconstructions and MIPs were obtained to evaluate the vascular anatomy. Multidetector CT imaging of the abdomen and pelvis was performed using the standard protocol during bolus administration of intravenous contrast. RADIATION DOSE REDUCTION: This exam was performed according to the departmental dose-optimization program which includes automated exposure control, adjustment of the mA and/or kV according to patient size and/or use of iterative reconstruction technique. CONTRAST:  152m OMNIPAQUE IOHEXOL 350 MG/ML SOLN COMPARISON:  May 19, 2022. FINDINGS: CTA CHEST FINDINGS Cardiovascular: Satisfactory opacification of the pulmonary arteries to the segmental level. No evidence of pulmonary embolism. Normal heart size. No pericardial effusion. Status post coronary artery bypass graft. Mediastinum/Nodes: No enlarged mediastinal, hilar, or axillary lymph nodes. Thyroid gland, trachea, and esophagus demonstrate no significant findings. Lungs/Pleura: No pneumothorax is noted. Moderate right pleural effusion is noted with minimal  adjacent subsegmental atelectasis of the right lower lobe. Left lung is unremarkable. Musculoskeletal: Nondisplaced fracture is seen involving the posterior portion of the left sixth rib. There appears to be a moderate fluid collection overlying the lateral and posterior portions of the left chest wall as well. Review of the MIP images confirms the above findings. CT ABDOMEN and PELVIS FINDINGS Hepatobiliary: No focal liver abnormality is seen. No gallstones, gallbladder wall thickening, or biliary dilatation. Pancreas: Unremarkable. No pancreatic ductal dilatation or surrounding inflammatory changes. Spleen: Normal in size without focal abnormality. Adrenals/Urinary Tract: Adrenal glands appear normal. Bilateral renal cysts are noted for which no further follow-up is required. Mild bilateral renal atrophy is noted. No hydronephrosis or renal obstruction is noted. Urinary bladder is unremarkable. Stomach/Bowel: Stomach is within normal limits. Appendix appears normal. No evidence of bowel wall thickening, distention, or inflammatory changes. Diverticulosis of descending and  sigmoid colon is noted without inflammation. Vascular/Lymphatic: Aortic atherosclerosis. No enlarged abdominal or pelvic lymph nodes. Reproductive: Stable mild prostatic enlargement is noted. Other: Small fat containing periumbilical hernia. No ascites is noted. Musculoskeletal: No acute or significant osseous findings. Review of the MIP images confirms the above findings. IMPRESSION: No definite evidence of pulmonary embolus. Status post coronary artery bypass graft. Moderate size right pleural effusion is noted with minimal adjacent subsegmental atelectasis of the right lower lobe. Nondisplaced left sixth rib fracture. Moderate sized fluid collection is seen overlying the lateral and posterior aspects of the left chest wall. Diverticulosis of descending and sigmoid colon without inflammation. Mild bilateral renal atrophy. Stable mild prostatic  enlargement. Aortic Atherosclerosis (ICD10-I70.0). Electronically Signed   By: Marijo Conception M.D.   On: 08/18/2022 12:09   DG Chest Portable 1 View  Result Date: 08/18/2022 CLINICAL DATA:  Chest pain, shortness of breath EXAM: PORTABLE CHEST 1 VIEW COMPARISON:  Previous studies including the examination of 06/23/2022 FINDINGS: Transverse diameter of heart is increased. There is previous coronary bypass surgery. There is interval removal of right IJ dialysis catheter. Central pulmonary vessels are prominent. There is slight increase in interstitial markings. There is a small to moderate right pleural effusion with interval decrease. Left lateral CP angle is clear. There is no pneumothorax. IMPRESSION: Cardiomegaly. Central pulmonary vessels are prominent. There is prominence of interstitial markings suggesting possible mild interstitial edema. There are no signs of alveolar pulmonary edema. Increased density in right lower lung fields may suggest pleural effusion and underlying atelectasis. Electronically Signed   By: Elmer Picker M.D.   On: 08/18/2022 08:52    Cardiac Studies     Assessment & Plan    # Paroxysmal SVT -Metoprolol tartrate dose was decreased from 75 mg to 50 mg twice daily (difficulties getting the tablets) few weeks ago and presented recently to the ER with symptomatic palpitations and chest pain, found to be in SVT with aberrancy.  He was also hypotensive upon arrival to the ER and briefly was on low-dose Levophed which was weaned later.  Lactic is normal, Pro-Cal negative, normal LVEF.  Started on amiodarone drip, currently rate controlled, switch amiodarone drip to p.o. amiodarone regimen. Start amiodarone 200 mg twice daily for 3 weeks followed by amiodarone 200 mg once daily. -Continue metoprolol succinate 50 mg once daily -Treat underlying etiology, patient hemoglobin dropped from 11 (months ago) to 8.6 now.  This might have triggered paroxysmal SVT. FOBT test  ordered.  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 Today's exam is admission.  Chest pain similar to chronic symptoms EKG during SVT episodes. No plan for repeat ischemic evaluation at this time.`  # Chronic heart failure, LVEF 50% -Management per nephrology  # HLD -Intolerant to statins, continue Repatha  # Anemia -Hemoglobin dropped from 11.7 to 8.6 in 2 months.  FOBT ordered.  No active bleeding.  Workup per primary team.  I have spent a total of 33 minutes with patient reviewing chart , telemetry, EKGs, labs and examining patient as well as establishing an assessment and plan that was discussed with the patient.  > 50% of time was spent in direct patient care.     Signed, Adianna Darwin Olene Craven, MD  08/19/2022, 10:14 AM

## 2022-08-19 NOTE — TOC Progression Note (Addendum)
Transition of Care [facility]) - Progression Note    Patient Details  Name: [name] MRN: [id number] Date of Birth: [birth year]  Transition of Care [facility]) CM/SW Contact  Salome Arnt, [place] Phone Number: 08/19/2022, 9:28 AM  Clinical Narrative:  PT recommending SNF. LCSW discussed with pt who states he wants home health instead. He has used [facility] in the past and agreeable to [place]. Cory with [facility] accepts for HHPT. Will need order.    Update: Per [facility] pt is active with PT/OT. Cory with [facility] updated. MD notified for home health resumption orders.   Expected Discharge Plan: Home/Self Care Barriers to Discharge: Continued Medical Work up  Expected Discharge Plan and Services In-house Referral: Clinical Social Work     Living arrangements for the past 2 months: [place]: PT [place]: [place] Date [place]: 08/19/22 Time [place]: [phone] Representative spoke with at [place]: [place] Determinants of Health ([place]) Interventions [place]: No Food Insecurity (08/18/2022)  Housing: Low Risk  (08/18/2022)  Transportation Needs: Unmet Transportation Needs (08/18/2022)  Utilities: Not At Risk (08/18/2022)  Alcohol Screen: Low Risk  (04/21/2022)  Depression (PHQ2-9): Medium Risk (08/11/2022)  Financial Resource Strain: Low Risk  (04/21/2022)  Physical Activity: Inactive (04/21/2022)  Social Connections: Unknown (04/21/2022)  Stress: No Stress Concern Present (04/21/2022)  Tobacco Use: Medium Risk (08/18/2022)    Readmission Risk Interventions    08/19/2022    8:11 AM 05/04/2022    9:06 AM 03/01/2022    8:14 AM  Readmission Risk Prevention Plan  Transportation Screening Complete Complete Complete  Medication Review (RN Care Manager) Complete Complete Complete  HRI or [place] Complete Complete Complete  SW Recovery Care/Counseling Consult  Complete Complete Complete  Palliative Care Screening Not Applicable Not Applicable Not [place] Not Applicable Not Applicable Not Applicable

## 2022-08-19 NOTE — Evaluation (Signed)
Physical Therapy Evaluation Patient Details Name: [name] MRN: [id number] DOB: 11/12/1955 Today's Date: 08/19/2022  History of Present Illness  [name] is a  67 year old male with a history of coronary disease status post CABG, systolic and diastolic CHF, diabetes mellitus type 2, hypertension, ESRD, SVT, esophageal adenocarcinoma, and OSA presenting with substernal chest pain and shortness of breath.  The patient states that he began developing substernal chest discomfort around 2 AM on 08/18/2022 about 30 to 60 minutes after eating toast and jelly.  Notably, the patient has had intermittent nausea and vomiting for the past week.  He had 4 episodes of nonbloody emesis on 08/17/2022.  In addition, the patient has been complaining of some worsening shortness of breath this past week.  He denies any fevers, chills, coughing, hemoptysis, hematemesis,.  He endorses compliance with dialysis on Monday, Wednesday, and Fridays.  His last dialysis was on Wednesday, 08/17/2022.  Because of his chest pain shortness of breath, the patient presented for further evaluation and treatment.  Notably, the patient is supposed to be on 2 L nasal cannula at home, but he has been out of oxygen for a couple weeks.  The patient also has long history of gait instability and falls.  He states that he falls about once per week, last fall on 08/12/2022.  The patient has had numerous hospital admissions.  Most recently he was hospitalized for about 24 hours on 06/23/2022 for atypical chest pain.  He was discharged home in stable condition.  He had another hospital admission from 05/27/2022 to 05/31/2022 for hematemesis.  EGD on 05/31/2023 showed moderate severe esophagitis without any active bleeding.   Clinical Impression  Patient required verbal/tactile cueing for reaching behind during stand to sitting with fair/poor carryover resulting in loss of balance, slow labored cadence during gait training having to lean on RW for  support and limited mostly due to c/o fatigue.  Patient tolerated sitting up in chair after therapy - nurse notified.  Patient will benefit from continued skilled physical therapy in hospital and recommended venue below to increase strength, balance, endurance for safe ADLs and gait.         Recommendations for follow up therapy are one component of a multi-disciplinary discharge planning process, led by the attending physician.  Recommendations may be updated based on patient status, additional functional criteria and insurance authorization.  Follow Up Recommendations Skilled nursing-short term rehab (<3 hours/day) Can patient physically be transported by private vehicle: Yes    Assistance Recommended at Discharge Set up Supervision/Assistance  Patient can return home with the following  A little help with bathing/dressing/bathroom;A lot of help with walking and/or transfers;Help with stairs or ramp for entrance;Assistance with cooking/housework    Equipment Recommendations None recommended by PT  Recommendations for Other Services       Functional Status Assessment Patient has had a recent decline in their functional status and demonstrates the ability to make significant improvements in function in a reasonable and predictable amount of time.     Precautions / Restrictions Precautions Precautions: Fall Restrictions Weight Bearing Restrictions: No      Mobility  Bed Mobility Overal bed mobility: Modified Independent                  Transfers Overall transfer level: Needs assistance Equipment used: Rolling walker (2 wheels) Transfers: Sit to/from Stand, Bed to chair/wheelchair/BSC Sit to Stand: Min guard   Step pivot transfers: Supervision, Min guard  General transfer comment: slightly labored movement with tendency not to reach behind during stand to sitting, flopping into chair    Ambulation/Gait Ambulation/Gait assistance: Min guard, Min assist Gait  Distance (Feet): 35 Feet Assistive device: Rolling walker (2 wheels) Gait Pattern/deviations: Decreased step length - right, Decreased step length - left, Decreased stride length, Trunk flexed Gait velocity: decreased     General Gait Details: labored slightly unsteady cadence with flexed trunk and limited mostly due to fatigue  Stairs            Wheelchair Mobility    Modified Rankin (Stroke Patients Only)       Balance Overall balance assessment: Needs assistance Sitting-balance support: Feet supported, No upper extremity supported Sitting balance-Leahy Scale: Good Sitting balance - Comments: seated at EOB   Standing balance support: Bilateral upper extremity supported, During functional activity, Reliant on assistive device for balance Standing balance-Leahy Scale: Fair Standing balance comment: using RW                             Pertinent Vitals/Pain Pain Assessment Pain Assessment: No/denies pain    Home Living Family/patient expects to be discharged to:: Private residence Living Arrangements: Alone Available Help at Discharge: Friend(s);Available PRN/intermittently Type of Home: House Home Access: Stairs to enter Entrance Stairs-Rails: Right Entrance Stairs-Number of Steps: 6 Alternate Level Stairs-Number of Steps: 6 + 6 Home Layout: Multi-level Home Equipment: Grab bars - tub/shower;Rolling Walker (2 wheels);Cane - quad Additional Comments: Pt reports no change in home set up other than that he no longer has care attendants.    Prior Function Prior Level of Function : Needs assist;History of Falls (last six months)       Physical Assist : Mobility (physical);ADLs (physical) Mobility (physical): Bed mobility;Transfers;Gait;Stairs ADLs (physical): IADLs Mobility Comments: Household ambulator using RW, uses wheelchair for longer distances ADLs Comments: Indepndent for ADL's; assisted by friends for IADL's.     Hand Dominance   Dominant  Hand: Right    Extremity/Trunk Assessment   Upper Extremity Assessment Upper Extremity Assessment: Defer to OT evaluation    Lower Extremity Assessment Lower Extremity Assessment: Generalized weakness    Cervical / Trunk Assessment Cervical / Trunk Assessment: Kyphotic  Communication   Communication: No difficulties  Cognition Arousal/Alertness: Awake/alert Behavior During Therapy: WFL for tasks assessed/performed Overall Cognitive Status: Within Functional Limits for tasks assessed                                          General Comments      Exercises     Assessment/Plan    PT Assessment Patient needs continued PT services  PT Problem List Decreased strength;Decreased activity tolerance;Decreased balance;Decreased mobility       PT Treatment Interventions DME instruction;Gait training;Stair training;Functional mobility training;Therapeutic activities;Therapeutic exercise;Balance training;Patient/family education    PT Goals (Current goals can be found in the Care Plan section)  Acute Rehab PT Goals Patient Stated Goal: return home PT Goal Formulation: With patient Time For Goal Achievement: 09/02/22 Potential to Achieve Goals: Good    Frequency Min 3X/week     Co-evaluation PT/OT/SLP Co-Evaluation/Treatment: Yes Reason for Co-Treatment: To address functional/ADL transfers PT goals addressed during session: Mobility/safety with mobility;Balance;Proper use of DME OT goals addressed during session: ADL's and self-care       AM-PAC PT "6 Clicks" Mobility  Outcome  Measure Help needed turning from your back to your side while in a flat bed without using bedrails?: None Help needed moving from lying on your back to sitting on the side of a flat bed without using bedrails?: None Help needed moving to and from a bed to a chair (including a wheelchair)?: A Lot Help needed standing up from a chair using your arms (e.g., wheelchair or bedside  chair)?: A Little Help needed to walk in hospital room?: A Little Help needed climbing 3-5 steps with a railing? : A Lot 6 Click Score: 18    End of Session   Activity Tolerance: Patient tolerated treatment well;Patient limited by fatigue Patient left: in chair;with call bell/phone within reach Nurse Communication: Mobility status PT Visit Diagnosis: Unsteadiness on feet (R26.81);Other abnormalities of gait and mobility (R26.89);Muscle weakness (generalized) (M62.81)    Time: [id number] PT Time Calculation (min) (ACUTE ONLY): 20 min   Charges:   PT Evaluation $PT Eval Moderate Complexity: 1 Mod PT Treatments $Therapeutic Activity: 8-22 mins        12:08 PM, 08/19/22 Lonell Grandchild, MPT Physical Therapist with [facility] 336 [phone] office [phone] mobile phone

## 2022-08-19 NOTE — Progress Notes (Signed)
   Nephrology Nursing Note:  HD is being rescheduled to tomorrow 08/20/22 d/t high inpatient census, per Dr. [name].  Can resume normal MWF schedule next week.  Rockwell Alexandria, RN AP [facility]

## 2022-08-19 NOTE — Plan of Care (Signed)
  Problem: Acute Rehab OT Goals (only OT should resolve) Goal: Pt. Will Perform Grooming Flowsheets (Taken 08/19/2022 1007) Pt Will Perform Grooming:  with modified independence  standing Goal: Pt. Will Perform Lower Body Bathing Flowsheets (Taken 08/19/2022 1007) Pt Will Perform Lower Body Bathing:  with modified independence  sitting/lateral leans Goal: Pt. Will Perform Lower Body Dressing Flowsheets (Taken 08/19/2022 1007) Pt Will Perform Lower Body Dressing:  with modified independence  sitting/lateral leans Goal: Pt. Will Transfer To Toilet Flowsheets (Taken 08/19/2022 1007) Pt Will Transfer to Toilet:  with modified independence  ambulating Goal: Pt/Caregiver Will Perform Home Exercise Program Flowsheets (Taken 08/19/2022 1007) Pt/caregiver will Perform Home Exercise Program:  Increased strength  Both right and left upper extremity  Independently  Lennyn Gange OT, MOT

## 2022-08-19 NOTE — Consult Note (Signed)
[facility] Renal Consultation Note    Indication for Consultation:  Management of ESRD/hemodialysis; anemia, hypertension/volume and secondary hyperparathyroidism  HPI: [name] is a 67 y.o. male  with PMH significant for CAD s/p CABG (2022), chronic combined systolic and diastolic CHF (EF [id number]), HTN, DM, OSA, h/o CVA, esophageal adenocarcinoma, and ESRD on HD MWF at [facility] who presented to [facility] ED via EMS after he developed worsening SOB and substernal chest pain early yesterday morning.  In the ED, SpO2 was in the mid 80's and developed SVT and became hypotensive.  He required levophed and was started on amio drip.  He was weaned off of levophed and he spontaneously converted to NSR.  He was placed on 4 L [place] with improvement of his SpO2.  CTA was negative for PE but did show large right pleural effusion.  CT of abdomen showed left sixth rib fracture.  He underwent US-guided thoracentesis  and yielded 1.2 liters of clear yellow fluid.  He was admitted for further workup and we were consulted to provide dialysis during his hospitalization.  Past Medical History:  Diagnosis Date   Anemia    Arthritis    hips shoulders   CAD (coronary artery disease) 03/03/2019   Cancer ([place])    Esophageal cancer 2022   CHF (congestive heart failure) ([place]) 2020   Chronic combined systolic and diastolic heart failure ([place]) 04/26/2018   Admitted 11/14-11/20/19-diuresed 10L   Depression 05/31/2021   Fatigue 10/14/2020   Hypertension    Myocardial infarction [facility])    Sleep apnea    uses a bipap machine   Stroke ([place]) 12/14/2020   no last weakness or paralysis   Type 2 diabetes mellitus with diabetic nephropathy ([place]) 04/23/2019   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   Past Surgical History:  Procedure Laterality Date   AV FISTULA PLACEMENT Left 12/21/2021   Procedure: LEFT ARM ARTERIOVENOUS (AV) FISTULA CREATION;  Surgeon: Rosetta Posner, MD;  Location: [facility];  Service: Vascular;   Laterality: Left;   BIOPSY  06/27/2019   Procedure: BIOPSY;  Surgeon: Rogene Houston, MD;  [facility];  Service: Endoscopy;;  ascending colon polyp   BIOPSY  10/21/2020   Procedure: BIOPSY;  Surgeon: Rogene Houston, MD;  [facility];  Service: Endoscopy;;  esophageal,gastric polyp   BIOPSY  02/24/2021   Procedure: BIOPSY;  Surgeon: Rogene Houston, MD;  [facility];  Service: Endoscopy;;  distal and proximal esophageal biopsies    BIOPSY  05/30/2022   Procedure: BIOPSY;  Surgeon: Eloise Harman, DO;  [facility];  Service: Endoscopy;;   CARDIAC SURGERY     CATARACT EXTRACTION     COLONOSCOPY N/A 06/27/2019   [name]:Diverticulosis in the entire examined colon. tubular adenoma in ascending colon, 59m tubular adenoma in prox sigmoid. external hemorrhoids   CORONARY ARTERY BYPASS GRAFT N/A 03/25/2019   Procedure: CORONARY ARTERY BYPASS GRAFTING (CABG) X  4 USING LEFT INTERNAL MAMMARY ARTERY AND RIGHT SAPHENOUS VEIN GRAFTS;  Surgeon: LLajuana Matte MD;  [place]  Service: Open Heart Surgery;  Laterality: N/A;   ESOPHAGEAL BRUSHING  03/08/2022   Procedure: ESOPHAGEAL BRUSHING;  Surgeon: CEloise Harman DO;  [facility];  Service: Endoscopy;;   ESOPHAGOGASTRODUODENOSCOPY (EGD) WITH PROPOFOL N/A 10/21/2020   rehman:Normal hypopharynx.Normal proximal esophagus and mid esophagus.Esophageal mucosal changes consistent with long-segment Barrett's esophagus. (Focal low-grade dysplasia and atypia proximally) z line irregular 37 cm from  incisors, 3 cm HH, single gastric polyp (fundic gland) normal duodenal bulb/second portion of duodenum, proximal margin of Barrett's at 34 cm   ESOPHAGOGASTRODUODENOSCOPY (EGD) WITH PROPOFOL N/A 02/24/2021   Procedure: ESOPHAGOGASTRODUODENOSCOPY (EGD) WITH PROPOFOL;  Surgeon: Rogene Houston, MD;  [facility];  Service: Endoscopy;  Laterality: N/A;  1:35   ESOPHAGOGASTRODUODENOSCOPY  (EGD) WITH PROPOFOL N/A 03/21/2021   Procedure: ESOPHAGOGASTRODUODENOSCOPY (EGD) WITH PROPOFOL;  Surgeon: Carol Ada, MD;  Location: [place];  Service: Endoscopy;  Laterality: N/A;   ESOPHAGOGASTRODUODENOSCOPY (EGD) WITH PROPOFOL N/A 10/23/2021   Procedure: ESOPHAGOGASTRODUODENOSCOPY (EGD) WITH PROPOFOL;  Surgeon: Doran Stabler, MD;  Location: [place];  Service: Gastroenterology;  Laterality: N/A;   ESOPHAGOGASTRODUODENOSCOPY (EGD) WITH PROPOFOL N/A 03/08/2022   Procedure: ESOPHAGOGASTRODUODENOSCOPY (EGD) WITH PROPOFOL;  Surgeon: Eloise Harman, DO;  [facility];  Service: Endoscopy;  Laterality: N/A;   ESOPHAGOGASTRODUODENOSCOPY (EGD) WITH PROPOFOL N/A 05/27/2022   Procedure: ESOPHAGOGASTRODUODENOSCOPY (EGD) WITH PROPOFOL;  Surgeon: Harvel Quale, MD;  [facility];  Service: Gastroenterology;  Laterality: N/A;   ESOPHAGOGASTRODUODENOSCOPY (EGD) WITH PROPOFOL N/A 05/30/2022   Procedure: ESOPHAGOGASTRODUODENOSCOPY (EGD) WITH PROPOFOL;  Surgeon: Eloise Harman, DO;  [facility];  Service: Endoscopy;  Laterality: N/A;   EXCISION MORTON'S NEUROMA     EYE SURGERY Left    retina   INCISION AND DRAINAGE OF WOUND Right 06/28/2021   Procedure: IRRIGATION AND DEBRIDEMENT WOUND;  Surgeon: [name], MD;  Location: [place];  Service: Plastics;  Laterality: Right;  1.5 hour   INSERTION OF DIALYSIS CATHETER Right 12/21/2021   Procedure: INSERTION OF TUNNELED DIALYSIS CATHETER;  Surgeon: Rosetta Posner, MD;  Location: [facility];  Service: Vascular;  Laterality: Right;   LEFT HEART CATH AND CORS/GRAFTS ANGIOGRAPHY N/A 02/01/2022   Procedure: LEFT HEART CATH AND CORS/GRAFTS ANGIOGRAPHY;  Surgeon: Martinique, [place] M, MD;  Location: [place] [facility];  Service: Cardiovascular;  Laterality: N/A;   POLYPECTOMY  06/27/2019   Procedure: POLYPECTOMY;  Surgeon: Rogene Houston, MD;  [facility];  Service: Endoscopy;;  proximal sigmoid colon    RIGHT/LEFT HEART CATH AND CORONARY ANGIOGRAPHY N/A 03/12/2019   Procedure: RIGHT/LEFT HEART CATH AND CORONARY ANGIOGRAPHY;  Surgeon: [name], MD;  Location: [place] [facility];  Service: Cardiovascular;  Laterality: N/A;   SHOULDER ARTHROSCOPY WITH ROTATOR CUFF REPAIR AND SUBACROMIAL DECOMPRESSION Right 08/26/2020   Procedure: RIGHT SHOULDER MINI OPEN ROTATOR CUFF REPAIR AND SUBACROMIAL DECOMPRESSION WITH PATCH GRAFT;  Surgeon: Susa Day, MD;  Location: [facility];  Service: Orthopedics;  Laterality: Right;  90 MINS GENERAL WITH BLOCK   SKIN SPLIT GRAFT Right 06/28/2021   Procedure: SKIN GRAFT SPLIT THICKNESS;  Surgeon: [name], MD;  Location: [place];  Service: Plastics;  Laterality: Right;   TEE WITHOUT CARDIOVERSION N/A 03/25/2019   Procedure: TRANSESOPHAGEAL ECHOCARDIOGRAM (TEE);  Surgeon: [name], MD;  Location: [place];  Service: Open Heart Surgery;  Laterality: N/A;   Family History:   Family History  Problem Relation Age of Onset   Colon cancer Mother    Heart Problems Father    Diabetes Father    Valvular heart disease Father    Sleep apnea Neg Hx    Stroke Neg Hx    Social History:  reports that he quit smoking about 34 years ago. His smoking use included cigarettes. He has been exposed to tobacco smoke. He has never used smokeless tobacco. He reports that he does not  currently use alcohol after a past usage of about 2.0 standard drinks of alcohol per week. He reports that he does not use drugs. Allergies  Allergen Reactions   Bee Venom Anaphylaxis   Atorvastatin Other (See Comments)    myalgia   Diltiazem Itching   Lopressor [Metoprolol]     NIGHTMARES    Reglan [Metoclopramide] Other (See Comments)    Suicidal    Rosuvastatin Other (See Comments)    myalgias   Valsartan Itching   Prior to Admission medications   Medication Sig Start Date End Date Taking? Authorizing Provider  albuterol (VENTOLIN HFA) 108 (90 Base) MCG/ACT inhaler INHALE  TWO PUFFS BY MOUTH EVERY 6 HOURS AS NEEDED 08/11/22  Yes [name], [name], MD  amiodarone (PACERONE) 200 MG tablet Take 1 tablet (200 mg total) by mouth daily. 200 mg once daily 08/02/22  Yes Loel Dubonnet, NP  aspirin EC 325 MG tablet Take 1 tablet (325 mg total) by mouth daily with breakfast. 06/23/22  Yes Emokpae, Courage, MD  calcium acetate (PHOSLO) 667 MG capsule Take 667 mg by mouth in the morning, at noon, and at bedtime.   Yes [provider]  doxazosin (CARDURA) 2 MG tablet TAKE 1 TABLET BY MOUTH DAILY 07/29/22  Yes [name], Scott A, MD  EPINEPHrine 0.3 mg/0.3 mL IJ SOAJ injection Inject 0.3 mg into the muscle as needed for anaphylaxis. 01/25/21  Yes [name], MD  hydrALAZINE (APRESOLINE) 50 MG tablet Take 1 tablet (50 mg total) by mouth 3 (three) times daily. 06/23/22  Yes Emokpae, Courage, MD  LANTUS 100 UNIT/ML injection 8 mLs daily. Sliding scale 04/07/22  Yes [provider]  metoprolol succinate (TOPROL-XL) 50 MG 24 hr tablet Take 1 tablet (50 mg total) by mouth daily. 08/02/22  Yes Loel Dubonnet, NP  nitroGLYCERIN (NITROSTAT) 0.4 MG SL tablet Place 1 tablet (0.4 mg total) under the tongue every 5 (five) minutes as needed for chest pain. 09/14/21  Yes Loel Dubonnet, NP  NOVOLOG FLEXPEN 100 UNIT/ML FlexPen Inject 4-10 Units into the skin 3 (three) times daily with meals. 07/07/22  Yes [provider]  ondansetron (ZOFRAN-ODT) 4 MG disintegrating tablet Take 1 tablet by mouth every 8 hours as needed for nausea and vomiting. 08/02/22  Yes Loel Dubonnet, NP  pantoprazole (PROTONIX) 40 MG tablet Take 1 tablet (40 mg total) by mouth 2 (two) times daily. 05/30/22  Yes Tat, Shanon Brow, MD  sertraline (ZOLOFT) 100 MG tablet TAKE 1 TABLET BY MOUTH DAILY 07/29/22  Yes [name], [name], MD  acetaminophen (TYLENOL) 325 MG tablet Take 2 tablets (650 mg total) by mouth every 6 (six) hours as needed for mild pain, moderate pain or fever. 08/02/21   Allie Bossier, MD   ascorbic acid (VITAMIN C) 500 MG tablet Take 1 tablet (500 mg total) by mouth daily. Patient not taking: Reported on 08/18/2022 08/03/21   Allie Bossier, MD  Evolocumab (REPATHA SURECLICK) [id number] MG/ML SOAJ Inject 140 mg into the skin every 14 (fourteen) days. 06/29/22   Loel Dubonnet, NP   Current Facility-Administered Medications  Medication Dose Route Frequency Provider Last Rate Last Admin   acetaminophen (TYLENOL) tablet 650 mg  650 mg Oral Q6H PRN Tat, Shanon Brow, MD       Or   acetaminophen (TYLENOL) suppository 650 mg  650 mg Rectal Q6H PRN Tat, Shanon Brow, MD       amiodarone (NEXTERONE PREMIX) 360-4.14 MG/200ML-% (1.8 mg/mL) IV infusion  30 mg/hr Intravenous  Continuous Tat, Shanon Brow, MD 16.67 mL/hr at 08/19/22 0807 30 mg/hr at 08/19/22 0807   calcium acetate (PHOSLO) capsule 667 mg  667 mg Oral TID WC Orson Eva, MD   667 mg at 08/19/22 [id number]   Chlorhexidine Gluconate Cloth 2 % PADS 6 each  6 each Topical [id number] Orson Eva, MD   6 each at 08/19/22 0810   etomidate (AMIDATE) injection 10 mg  10 mg Intravenous Once Fransico Meadow, MD       guaiFENesin-dextromethorphan (ROBITUSSIN DM) 100-10 MG/5ML syrup 5 mL  5 mL Oral Q4H PRN Tat, Shanon Brow, MD   5 mL at 08/19/22 0811   insulin aspart (novoLOG) injection 0-5 Units  0-5 Units Subcutaneous QHS Tat, Shanon Brow, MD       insulin aspart (novoLOG) injection 0-6 Units  0-6 Units Subcutaneous TID WC Orson Eva, MD   1 Units at 08/19/22 [id number]   metoprolol succinate (TOPROL-XL) 24 hr tablet 50 mg  50 mg Oral Daily Tat, Shanon Brow, MD   50 mg at 08/19/22 0809   norepinephrine (LEVOPHED) '4mg'$  in 223m (0.016 mg/mL) premix infusion  0-40 mcg/min Intravenous Titrated PFransico Meadow MD   Stopped at 08/18/22 1015   ondansetron (ZOFRAN) tablet 4 mg  4 mg Oral Q6H PRN Tat, DShanon Brow MD       Or   ondansetron (ZOFRAN) injection 4 mg  4 mg Intravenous Q6H PRN Tat, DShanon Brow MD       pantoprazole (PROTONIX) EC tablet 40 mg  40 mg Oral BID Tat, DShanon Brow MD   40 mg at 08/19/22 0809    sertraline (ZOLOFT) tablet 50 mg  50 mg Oral Daily Tat, DShanon Brow MD   50 mg at 08/19/22 [id number]  Labs: Basic Metabolic Panel: Recent Labs  Lab 08/18/22 0849 08/19/22 0500  NA 133* 132*  K 3.6 4.0  CL 92* 92*  CO2 29 26  GLUCOSE 321* 281*  BUN 25* 28*  CREATININE 3.53* 4.23*  CALCIUM 10.1 9.4  PHOS  --  2.9   Liver Function Tests: Recent Labs  Lab 08/18/22 0849 08/19/22 0416 08/19/22 0500  AST 12* 9*  --   ALT 8 7  --   ALKPHOS 113 89  --   BILITOT 1.1 1.2  --   PROT 6.3* 5.8*  --   ALBUMIN 3.2* 2.9* 2.9*   Recent Labs  Lab 08/18/22 0849  LIPASE 52*   No results for input(s): "AMMONIA" in the last 168 hours. CBC: Recent Labs  Lab 08/18/22 0849 08/19/22 0416  WBC 10.5 7.9  HGB 8.6* 8.6*  HCT 28.0* 28.5*  MCV 90.0 90.8  PLT 316 278   Cardiac Enzymes: No results for input(s): "CKTOTAL", "CKMB", "CKMBINDEX", "TROPONINI" in the last 168 hours. CBG: Recent Labs  Lab 08/18/22 0826 08/18/22 1537 08/18/22 2202 08/19/22 0731  GLUCAP 318* 262* 178* 164*   Iron Studies: No results for input(s): "IRON", "TIBC", "TRANSFERRIN", "FERRITIN" in the last 72 hours. Studies/Results: [place]ORACENTESIS ASP PLEURAL SPACE W/IMG GUIDE  Result Date: 08/18/2022 INDICATION: Patient with a history of heart failure and gastric/esophageal cancer with right pleural effusion. Interventional Radiology asked to perform a diagnostic and therapeutic thoracentesis. EXAM: ULTRASOUND GUIDED THORACENTESIS MEDICATIONS: None. COMPLICATIONS: None immediate. PROCEDURE: An ultrasound guided thoracentesis was thoroughly discussed with the patient and questions answered. The benefits, risks, alternatives and complications were also discussed. The patient understands and wishes to proceed with the procedure. Written consent was obtained. Ultrasound was performed to localize and mark an adequate pocket of fluid  in the right chest. The area was then prepped and draped in the normal sterile fashion. 1% Lidocaine  was used for local anesthesia. Under ultrasound guidance a 6 Fr Safe-T-Centesis catheter was introduced. Thoracentesis was performed. The catheter was removed and a dressing applied. FINDINGS: A total of approximately 1.2 L of clear yellow fluid was removed. Samples were sent to the laboratory as requested by the clinical team. IMPRESSION: Successful ultrasound guided right thoracentesis yielding 1.2 L of pleural fluid. Read by: Soyla Dryer, NP Electronically Signed   By: Titus Dubin M.D.   On: 08/18/2022 16:35   DG Chest Port 1 View  Result Date: 08/18/2022 CLINICAL DATA:  Status post right thoracentesis EXAM: PORTABLE CHEST 1 VIEW COMPARISON:  CT chest 08/18/2022 and chest radiograph 08/18/2022 FINDINGS: Previous right pleural effusion no longer readily apparent. No visible pneumothorax. Moderate enlargement of the cardiopericardial silhouette. Prior CABG. Indistinct pulmonary vasculature may reflect pulmonary venous hypertension but there is no overt edema. IMPRESSION: 1. Previous right pleural effusion no longer readily apparent. No pneumothorax. 2. Moderate enlargement of the cardiopericardial silhouette with potential pulmonary venous hypertension but no overt edema. 3. Prior CABG. Electronically Signed   By: Van Clines M.D.   On: 08/18/2022 15:34   ECHOCARDIOGRAM COMPLETE  Result Date: 08/18/2022    ECHOCARDIOGRAM REPORT   Patient Name:   [name] Date of Exam: 08/18/2022 Medical Rec #:  [id number]        Height:       70.0 in Accession #:    [id number]       Weight:       199.0 lb Date of Birth:  13-Aug-1955        BSA:          2.083 m Patient Age:    57 years         BP:           154/73 mmHg Patient Gender: M                HR:           82 bpm. Exam Location:  [facility] Procedure: 2D Echo, Cardiac Doppler and Color Doppler STAT ECHO Indications:    Chest Pain R07.9  History:        Patient has prior history of Echocardiogram examinations, most                 recent 02/19/2022.  CHF and Cardiomyopathy, CAD, Prior CABG,                 Stroke, Arrythmias:Tachycardia, Signs/Symptoms:Shortness of                 Breath and Chest Pain; Risk Factors:Hypertension, Diabetes and                 Dyslipidemia.  Sonographer:    Alvino Chapel RCS Referring Phys: [id number] [place]  1. Left ventricular ejection fraction, by estimation, is 50%. The left ventricle has low normal function. The left ventricle has no regional wall motion abnormalities. There is moderate left ventricular hypertrophy. Left ventricular diastolic parameters  are consistent with Grade II diastolic dysfunction (pseudonormalization). Elevated left atrial pressure.  2. Right ventricular systolic function is normal. The right ventricular size is mildly enlarged. There is severely elevated pulmonary artery systolic pressure.  3. Left atrial size was severely dilated.  4. Right atrial size was severely dilated.  5. The mitral valve is abnormal. Trivial mitral  valve regurgitation.  6. The tricuspid valve is abnormal.  7. The aortic valve has an indeterminant number of cusps. Aortic valve regurgitation is not visualized. No aortic stenosis is present.  8. The inferior vena cava is dilated in size with <50% respiratory variability, suggesting right atrial pressure of 15 mmHg. FINDINGS  Left Ventricle: Left ventricular ejection fraction, by estimation, is 50%. The left ventricle has low normal function. The left ventricle has no regional wall motion abnormalities. Definity contrast agent was given IV to delineate the left ventricular endocardial borders. The left ventricular internal cavity size was normal in size. There is moderate left ventricular hypertrophy. Left ventricular diastolic parameters are consistent with Grade II diastolic dysfunction (pseudonormalization). Elevated left atrial pressure. Right Ventricle: The right ventricular size is mildly enlarged. Right vetricular wall thickness was not well visualized.  Right ventricular systolic function is normal. There is severely elevated pulmonary artery systolic pressure. The tricuspid regurgitant velocity is 3.61 m/s, and with an assumed right atrial pressure of 15 mmHg, the estimated right ventricular systolic pressure is [id number]. Left Atrium: Left atrial size was severely dilated. Right Atrium: Right atrial size was severely dilated. Pericardium: There is no evidence of pericardial effusion. Mitral Valve: The mitral valve is abnormal. There is mild thickening of the mitral valve leaflet(s). There is mild calcification of the mitral valve leaflet(s). Mild mitral annular calcification. Trivial mitral valve regurgitation. MV peak gradient, 6.9 mmHg. The mean mitral valve gradient is 2.0 mmHg. Tricuspid Valve: The tricuspid valve is abnormal. Tricuspid valve regurgitation is mild . No evidence of tricuspid stenosis. Aortic Valve: The aortic valve has an indeterminant number of cusps. Aortic valve regurgitation is not visualized. No aortic stenosis is present. Aortic valve mean gradient measures 4.7 mmHg. Aortic valve peak gradient measures 10.8 mmHg. Aortic valve area, by VTI measures 2.29 cm. Pulmonic Valve: The pulmonic valve was not well visualized. Pulmonic valve regurgitation is not visualized. No evidence of pulmonic stenosis. Aorta: The aortic root is normal in size and structure. Venous: The inferior vena cava is dilated in size with less than 50% respiratory variability, suggesting right atrial pressure of 15 mmHg. IAS/Shunts: No atrial level shunt detected by color flow Doppler.  LEFT VENTRICLE PLAX 2D LVIDd:         5.80 cm   Diastology LVIDs:         4.30 cm   LV e' medial:    5.11 cm/s LV PW:         1.30 cm   LV E/e' medial:  26.8 LV IVS:        1.30 cm   LV e' lateral:   12.10 cm/s LVOT diam:     2.00 cm   LV E/e' lateral: 11.3 LV SV:         73 LV SV Index:   35 LVOT Area:     3.14 cm  RIGHT VENTRICLE RV S prime:     8.81 cm/s TAPSE (M-mode): 1.8 cm LEFT  ATRIUM              Index        RIGHT ATRIUM           Index LA diam:        5.40 cm  2.59 cm/m   RA Area:     31.70 cm LA Vol (A2C):   141.0 ml 67.69 ml/m  RA Volume:   125.00 ml 60.01 ml/m LA Vol (A4C):   145.0 ml 69.62 ml/m LA  Biplane Vol: 148.0 ml 71.06 ml/m  AORTIC VALVE AV Area (Vmax):    1.97 cm AV Area (Vmean):   2.22 cm AV Area (VTI):     2.29 cm AV Vmax:           164.56 cm/s AV Vmean:          97.590 cm/s AV VTI:            0.316 m AV Peak Grad:      10.8 mmHg AV Mean Grad:      4.7 mmHg LVOT Vmax:         103.00 cm/s LVOT Vmean:        69.000 cm/s LVOT VTI:          0.231 m LVOT/AV VTI ratio: 0.73  AORTA Ao Root diam: 3.50 cm MITRAL VALVE                TRICUSPID VALVE MV Area (PHT): 4.68 cm     TR Peak grad:   52.1 mmHg MV Area VTI:   2.15 cm     TR Vmax:        361.00 cm/s MV Peak grad:  6.9 mmHg MV Mean grad:  2.0 mmHg     SHUNTS MV Vmax:       1.31 m/s     Systemic VTI:  0.23 m MV Vmean:      64.8 cm/s    Systemic Diam: 2.00 cm MV Decel Time: 162 msec MV E velocity: 137.00 cm/s MV A velocity: 84.80 cm/s MV E/A ratio:  1.62 Carlyle Dolly MD Electronically signed by Carlyle Dolly MD Signature Date/Time: 08/18/2022/12:22:42 PM    Final    CT ABDOMEN PELVIS W CONTRAST  Result Date: 08/18/2022 CLINICAL DATA:  Chest pain. Nausea, vomiting, diarrhea. History of gastric and esophageal carcinoma. EXAM: CT ANGIOGRAPHY CHEST CT ABDOMEN AND PELVIS WITH CONTRAST TECHNIQUE: Multidetector CT imaging of the chest was performed using the standard protocol during bolus administration of intravenous contrast. Multiplanar CT image reconstructions and MIPs were obtained to evaluate the vascular anatomy. Multidetector CT imaging of the abdomen and pelvis was performed using the standard protocol during bolus administration of intravenous contrast. RADIATION DOSE REDUCTION: This exam was performed according to the departmental dose-optimization program which includes automated exposure control, adjustment  of the mA and/or kV according to patient size and/or use of iterative reconstruction technique. CONTRAST:  120m OMNIPAQUE IOHEXOL 350 MG/ML SOLN COMPARISON:  May 19, 2022. FINDINGS: CTA CHEST FINDINGS Cardiovascular: Satisfactory opacification of the pulmonary arteries to the segmental level. No evidence of pulmonary embolism. Normal heart size. No pericardial effusion. Status post coronary artery bypass graft. Mediastinum/Nodes: No enlarged mediastinal, hilar, or axillary lymph nodes. Thyroid gland, trachea, and esophagus demonstrate no significant findings. Lungs/Pleura: No pneumothorax is noted. Moderate right pleural effusion is noted with minimal adjacent subsegmental atelectasis of the right lower lobe. Left lung is unremarkable. Musculoskeletal: Nondisplaced fracture is seen involving the posterior portion of the left sixth rib. There appears to be a moderate fluid collection overlying the lateral and posterior portions of the left chest wall as well. Review of the MIP images confirms the above findings. CT ABDOMEN and PELVIS FINDINGS Hepatobiliary: No focal liver abnormality is seen. No gallstones, gallbladder wall thickening, or biliary dilatation. Pancreas: Unremarkable. No pancreatic ductal dilatation or surrounding inflammatory changes. Spleen: Normal in size without focal abnormality. Adrenals/Urinary Tract: Adrenal glands appear normal. Bilateral renal cysts are noted for which no further follow-up is required. Mild bilateral  renal atrophy is noted. No hydronephrosis or renal obstruction is noted. Urinary bladder is unremarkable. Stomach/Bowel: Stomach is within normal limits. Appendix appears normal. No evidence of bowel wall thickening, distention, or inflammatory changes. Diverticulosis of descending and sigmoid colon is noted without inflammation. Vascular/Lymphatic: Aortic atherosclerosis. No enlarged abdominal or pelvic lymph nodes. Reproductive: Stable mild prostatic enlargement is noted.  Other: Small fat containing periumbilical hernia. No ascites is noted. Musculoskeletal: No acute or significant osseous findings. Review of the MIP images confirms the above findings. IMPRESSION: No definite evidence of pulmonary embolus. Status post coronary artery bypass graft. Moderate size right pleural effusion is noted with minimal adjacent subsegmental atelectasis of the right lower lobe. Nondisplaced left sixth rib fracture. Moderate sized fluid collection is seen overlying the lateral and posterior aspects of the left chest wall. Diverticulosis of descending and sigmoid colon without inflammation. Mild bilateral renal atrophy. Stable mild prostatic enlargement. Aortic Atherosclerosis (ICD10-I70.0). Electronically Signed   By: Marijo Conception M.D.   On: 08/18/2022 12:09   CT Angio Chest PE W and/or Wo Contrast  Result Date: 08/18/2022 CLINICAL DATA:  Chest pain. Nausea, vomiting, diarrhea. History of gastric and esophageal carcinoma. EXAM: CT ANGIOGRAPHY CHEST CT ABDOMEN AND PELVIS WITH CONTRAST TECHNIQUE: Multidetector CT imaging of the chest was performed using the standard protocol during bolus administration of intravenous contrast. Multiplanar CT image reconstructions and MIPs were obtained to evaluate the vascular anatomy. Multidetector CT imaging of the abdomen and pelvis was performed using the standard protocol during bolus administration of intravenous contrast. RADIATION DOSE REDUCTION: This exam was performed according to the departmental dose-optimization program which includes automated exposure control, adjustment of the mA and/or kV according to patient size and/or use of iterative reconstruction technique. CONTRAST:  167m OMNIPAQUE IOHEXOL 350 MG/ML SOLN COMPARISON:  May 19, 2022. FINDINGS: CTA CHEST FINDINGS Cardiovascular: Satisfactory opacification of the pulmonary arteries to the segmental level. No evidence of pulmonary embolism. Normal heart size. No pericardial effusion.  Status post coronary artery bypass graft. Mediastinum/Nodes: No enlarged mediastinal, hilar, or axillary lymph nodes. Thyroid gland, trachea, and esophagus demonstrate no significant findings. Lungs/Pleura: No pneumothorax is noted. Moderate right pleural effusion is noted with minimal adjacent subsegmental atelectasis of the right lower lobe. Left lung is unremarkable. Musculoskeletal: Nondisplaced fracture is seen involving the posterior portion of the left sixth rib. There appears to be a moderate fluid collection overlying the lateral and posterior portions of the left chest wall as well. Review of the MIP images confirms the above findings. CT ABDOMEN and PELVIS FINDINGS Hepatobiliary: No focal liver abnormality is seen. No gallstones, gallbladder wall thickening, or biliary dilatation. Pancreas: Unremarkable. No pancreatic ductal dilatation or surrounding inflammatory changes. Spleen: Normal in size without focal abnormality. Adrenals/Urinary Tract: Adrenal glands appear normal. Bilateral renal cysts are noted for which no further follow-up is required. Mild bilateral renal atrophy is noted. No hydronephrosis or renal obstruction is noted. Urinary bladder is unremarkable. Stomach/Bowel: Stomach is within normal limits. Appendix appears normal. No evidence of bowel wall thickening, distention, or inflammatory changes. Diverticulosis of descending and sigmoid colon is noted without inflammation. Vascular/Lymphatic: Aortic atherosclerosis. No enlarged abdominal or pelvic lymph nodes. Reproductive: Stable mild prostatic enlargement is noted. Other: Small fat containing periumbilical hernia. No ascites is noted. Musculoskeletal: No acute or significant osseous findings. Review of the MIP images confirms the above findings. IMPRESSION: No definite evidence of pulmonary embolus. Status post coronary artery bypass graft. Moderate size right pleural effusion is noted with minimal adjacent subsegmental atelectasis  of  the right lower lobe. Nondisplaced left sixth rib fracture. Moderate sized fluid collection is seen overlying the lateral and posterior aspects of the left chest wall. Diverticulosis of descending and sigmoid colon without inflammation. Mild bilateral renal atrophy. Stable mild prostatic enlargement. Aortic Atherosclerosis (ICD10-I70.0). Electronically Signed   By: Marijo Conception M.D.   On: 08/18/2022 12:09   DG Chest Portable 1 View  Result Date: 08/18/2022 CLINICAL DATA:  Chest pain, shortness of breath EXAM: PORTABLE CHEST 1 VIEW COMPARISON:  Previous studies including the examination of 06/23/2022 FINDINGS: Transverse diameter of heart is increased. There is previous coronary bypass surgery. There is interval removal of right IJ dialysis catheter. Central pulmonary vessels are prominent. There is slight increase in interstitial markings. There is a small to moderate right pleural effusion with interval decrease. Left lateral CP angle is clear. There is no pneumothorax. IMPRESSION: Cardiomegaly. Central pulmonary vessels are prominent. There is prominence of interstitial markings suggesting possible mild interstitial edema. There are no signs of alveolar pulmonary edema. Increased density in right lower lung fields may suggest pleural effusion and underlying atelectasis. Electronically Signed   By: Elmer Picker M.D.   On: 08/18/2022 08:52    ROS: Pertinent items are noted in HPI. Physical Exam: Vitals:   08/19/22 0806 08/19/22 0810 08/19/22 0900 08/19/22 0924  BP:   (!) 127/57   Pulse:  80 73 69  Resp:  20 (!) 29 19  Temp: 98.4 F (36.9 C)     TempSrc: Oral     SpO2:  98% 92% 96%  Weight:      Height:          Weight change:   Intake/Output Summary (Last 24 hours) at 08/19/2022 0941 Last data filed at 08/19/2022 0744 Gross per 24 hour  Intake 580.35 ml  Output 300 ml  Net 280.35 ml   BP (!) 127/57   Pulse 69   Temp 98.4 F (36.9 C) (Oral)   Resp 19   Ht '5\' 10"'$  (1.778 m)    Wt 89.5 kg   SpO2 96%   BMI 28.31 kg/m  General appearance: fatigued and no distress Head: Normocephalic, without obvious abnormality, atraumatic Resp: clear to auscultation bilaterally Cardio: regular rate and rhythm, S1, S2 normal, no murmur, click, rub or gallop GI: soft, non-tender; bowel sounds normal; no masses,  no organomegaly Extremities: extremities normal, atraumatic, no cyanosis or edema and LUE AVF +T/B Dialysis Access:  Dialysis Orders: Center: [facility]  on MWF . EDW 84.5kg HD Bath 2K/2.5Ca  Time 4:00 Heparin 5000 units IVP. Access LUE AVF BFR 300 DFR 600    Micera 150 mcg IV q2 weeks, Venofer 100 mg IVP tiw  Assessment/Plan:  PSVT - started amio gtt per Cardiology.  Holding beta-blocker due to transient hypotension but likely to resume after HD.   Acute on chronic hypoxic respiratory failure - apparently ran out of oxygen at home for the past 2 weeks.  Now on supplemental oxygen.  ESRD -  Will plan on HD today to keep on outpatient schedule.  Hypertension/volume  - has significant idwg due to noncompliance with fluid restriction.  No evidence of lower extremity edema, will UF as tolerated.  Anemia  - Hgb stable, continue with ESA and will order IV venofer  Metabolic bone disease -  continue with home meds  Nutrition -  renal diet, heart healthy, carb modified.  Right pleural effusion - s/p thoracentesis with improved oxygenation.  Chronic combined systolic and diastolic CHF -  UF as tolerated.  Esophageal cancer - has history of esophagitis and associated N/V.  On PPI.  Donetta Potts, MD [place] 08/19/2022, 9:41 AM

## 2022-08-19 NOTE — Progress Notes (Signed)
PROGRESS NOTE  [name] MUSIC [id number] DOB: Oct 06, 1955 DOA: 08/18/2022 PCP: [name], MD  Brief History:   67 year old male with a history of coronary disease status post CABG, systolic and diastolic CHF, diabetes mellitus type 2, hypertension, ESRD, SVT, esophageal adenocarcinoma, and OSA presenting with substernal chest pain and shortness of breath.  The patient states that he began developing substernal chest discomfort around 2 AM on 08/18/2022 about 30 to 60 minutes after eating toast and jelly.  Notably, the patient has had intermittent nausea and vomiting for the past week.  He had 4 episodes of nonbloody emesis on 08/17/2022.  In addition, the patient has been complaining of some worsening shortness of breath this past week. He denies any fevers, chills, coughing, hemoptysis, hematemesis,.  He endorses compliance with dialysis on Monday, Wednesday, and Fridays.  His last dialysis was on Wednesday, 08/17/2022.  Because of his chest pain shortness of breath, the patient presented for further evaluation and treatment. Notably, the patient is supposed to be on 2 L nasal cannula at home, but he has been out of oxygen for a couple weeks.  The patient also has long history of gait instability and falls.  He states that he falls about once per week, last fall on 08/12/2022. The patient has had numerous hospital admissions.  Most recently he was hospitalized for about 24 hours on 06/23/2022 for atypical chest pain.  He was discharged home in stable condition.  He had another hospital admission from 05/27/2022 to 05/31/2022 for hematemesis.  EGD on 05/31/2023 showed moderate severe esophagitis without any active bleeding.  In the ED, the patient was afebrile.  He went into SVT and became hypotensive.  He was started on amiodarone drip.  He required Levophed for short period of time.  He spontaneously converted to sinus rhythm.  He was placed on 4 L nasal cannula with oxygen saturation 100%.   WBC 10.5, hemoglobin 8.6, platelets 216,000.  Sodium 133, potassium 3.6, bicarbonate 29, serum creatinine 3.53.  Lactic acid 1.6.  PCT 0.40.  D-dimer 2.83.  EKG initially showed SVT with nonspecific ST changes. CTA chest was negative for PE but showed a moderate right pleural effusion with atelectasis.  There is nondisplaced left sixth rib fracture.  There is also fluid collection in the left chest wall.  CT of the abdomen was negative for any acute findings.    Assessment/Plan: Chest pain -mostly atypical by history -related to his GI issues, esophageal cancer, vomiting -troponins flat --08/18/22 Echo EF 50%, no WMA, no WMA, normal RVF, PASP 67.1   SVT -previously on  amiodarone 200 mg daily -became hypotensive and required levophed short time  -placed on amio drip>>converted to sinus --08/18/22 Echo EF 50%, no WMA, no WMA, normal RVF, PASP 67.1 -appreciate cardiology consult -3/8 transitioned to po amiodarone; remains in sinus   Intractable nausea and vomiting -related to his esophagitis/esophageal adenocarcinoma -08/04/22 EGD--Dr. Jerilynn Mages C reflux esophagitis, medium hiatus hernia;  PATHOLOGY>>Cardio-oxyntic mucosa with moderate chronic active gastritis and proton pump inhibitor (PPI) effect.  NO dysplasia or metaplasia on esophageal biopsy -Dr. Evangeline Gula increased omeprazole to TID -continue pantoprazole -clear liquid diet for now>>tolerated -advance to full liquids   Acute on chronic respiratory failure with hypoxia -chronically on 2L at home but "ran out" oxygen last 2-3 weeks -now on 4L -wean back to 2L -started on home oxygen after his 12/15-12/19/23 admission -due to fluid overload and right pleural effusion -08/18/22 CTA  chest--mod R-pleural effusion, no PE, nondisplaced left 6th rib fracture   Chronic combined systolic and diastolic CHF -123456 Echo--EF 40-45%, no WMA, mod RV dysfx, PASP 56.5  -03/01/22 Echo--EF 40-45%, global HK, RV overload  -08/18/22 Echo EF 50%, no  WMA, normal RVF, PASP 67.1 -Fluid removal via dialysis   Acute Blood Loss Anemia -06/23/22 Hgb 11.7 -presented with Hgb 8.6 -FOBT negative -due to fall and ecchymosis/hematoma on left chest/flank   ESRD -He dialyzes on Monday, Wednesday, Friday -Consulted nephrology for maintenance dialysis -He was started on dialysis sometime in August 2023 -Nephrology consulted for maintenance dialysis -last dialyzed on 08/16/32 -plan next HD 3/9   Right pleural effusion -This has been chronic -He has had previous thoracocentesis in the past, last done 03/01/2022 -request thora 3/7>>1.2L removed -pleural fluid transudative   S/P CABG (coronary artery bypass graft) Recent cardiac cath 01/2021, showed patent grafts, severe three-vessel native CAD.Marland Kitchen  History of CABG 2020.   Essential hypertension -Restarted metoprolol succinate -Holding hydralazine and doxazosin for soft blood pressures initially   Esophageal adenocarcinoma (HCC) Follows GI at [facility], Dr. [name] --considered poor candidate for esophagectomy. After multidisciplinary discussion the plan was to continue with endoscopic treatments, and continue to follow-up with oncology for surveillance and any signs of progression of disease -status post EMR, cryotherapy, and most recently RFA in April 2023 -10/01/21 PET scan--no local recurrence -08/04/22 EGD--Dr. Jerilynn Mages C reflux esophagitis, medium hiatus hernia;  PATHOLOGY>>Cardio-oxyntic mucosa with moderate chronic active gastritis and proton pump inhibitor (PPI) effect.  NO dysplasia or metaplasia on esophageal biopsy -Dr. Evangeline Gula increased omeprazole to TID   Uncontrolled diabetes mellitus type 2 with hyperglycemia -01/31/22 A1C--7.7 -novolog sliding scale -08/18/22 A1C--8.1 -add novolog 2 units with meals       Family Communication: no  Family at bedside  Consultants:  renal; cardiology  Code Status:  FULL   DVT Prophylaxis:  SCDs   Procedures: As Listed in Progress Note  Above  Antibiotics: None       Subjective: Patient denies fevers, chills, headache, chest pain, dyspnea, nausea, vomiting, diarrhea, abdominal pain, dysuria, hematuria, hematochezia, and melena.   Objective: Vitals:   08/19/22 1600 08/19/22 1650 08/19/22 1700 08/19/22 1730  BP: (!) 122/53  (!) 128/55   Pulse: 71  72 71  Resp: '20  18 19  '$ Temp:  98.3 F (36.8 C)    TempSrc:  Oral    SpO2: 90%  90% 91%  Weight:      Height:        Intake/Output Summary (Last 24 hours) at 08/19/2022 1800 Last data filed at 08/19/2022 1741 Gross per 24 hour  Intake 667.09 ml  Output 300 ml  Net 367.09 ml   Weight change:  Exam:  General:  Pt is alert, follows commands appropriately, not in acute distress HEENT: No icterus, No thrush, No neck mass, [place]/AT Cardiovascular: RRR, S1/S2, no rubs, no gallops Respiratory: bibasilar crackles . No wheeze Abdomen: Soft/+BS, non tender, non distended, no guarding Extremities: No edema, No lymphangitis, No petechiae, No rashes, no synovitis   Data Reviewed: I have personally reviewed following labs and imaging studies Basic Metabolic Panel: Recent Labs  Lab 08/18/22 0849 08/19/22 0500  NA 133* 132*  K 3.6 4.0  CL 92* 92*  CO2 29 26  GLUCOSE 321* 281*  BUN 25* 28*  CREATININE 3.53* 4.23*  CALCIUM 10.1 9.4  MG 3.1*  --   PHOS  --  2.9   Liver Function Tests: Recent Labs  Lab 08/18/22  [id number] 08/19/22 0416 08/19/22 0500  AST 12* 9*  --   ALT 8 7  --   ALKPHOS 113 89  --   BILITOT 1.1 1.2  --   PROT 6.3* 5.8*  --   ALBUMIN 3.2* 2.9* 2.9*   Recent Labs  Lab 08/18/22 0849  LIPASE 52*   No results for input(s): "AMMONIA" in the last 168 hours. Coagulation Profile: Recent Labs  Lab 08/18/22 0849  INR 1.1   CBC: Recent Labs  Lab 08/18/22 0849 08/19/22 0416  WBC 10.5 7.9  HGB 8.6* 8.6*  HCT 28.0* 28.5*  MCV 90.0 90.8  PLT 316 278   Cardiac Enzymes: No results for input(s): "CKTOTAL", "CKMB", "CKMBINDEX", "TROPONINI" in  the last 168 hours. BNP: Invalid input(s): "POCBNP" CBG: Recent Labs  Lab 08/18/22 1537 08/18/22 2202 08/19/22 0731 08/19/22 1132 08/19/22 1644  GLUCAP 262* 178* 164* 232* 259*   HbA1C: Recent Labs    08/18/22 1517  HGBA1C 8.1*   Urine analysis:    Component Value Date/Time   COLORURINE YELLOW 02/28/2022 1414   APPEARANCEUR CLEAR 02/28/2022 1414   LABSPEC 1.014 02/28/2022 1414   PHURINE 5.0 02/28/2022 1414   GLUCOSEU >=500 (A) 02/28/2022 1414   HGBUR NEGATIVE 02/28/2022 1414   BILIRUBINUR NEGATIVE 02/28/2022 1414   KETONESUR NEGATIVE 02/28/2022 1414   PROTEINUR >=300 (A) 02/28/2022 1414   NITRITE NEGATIVE 02/28/2022 1414   LEUKOCYTESUR NEGATIVE 02/28/2022 1414   Sepsis Labs: '@LABRCNTIP'$ (procalcitonin:4,lacticidven:4) ) Recent Results (from the past 240 hour(s))  MRSA Next Gen by PCR, Nasal     Status: None   Collection Time: 08/18/22  2:00 PM   Specimen: Nasal Mucosa; Nasal Swab  Result Value Ref Range Status   MRSA by PCR Next Gen NOT DETECTED NOT DETECTED Final    Comment: (NOTE) The GeneXpert MRSA Assay (FDA approved for NASAL specimens only), is one component of a comprehensive MRSA colonization surveillance program. It is not intended to diagnose MRSA infection nor to guide or monitor treatment for MRSA infections. Test performance is not FDA approved in patients less than 48 years old. Performed at [facility], [address] [place], [place] [address]   Gram stain     Status: None   Collection Time: 08/18/22  3:00 PM   Specimen: Pleura  Result Value Ref Range Status   Specimen Description PLEURAL  Final   Special Requests NONE  Final   Gram Stain   Final    WBC PRESENT, PREDOMINANTLY MONONUCLEAR NO ORGANISMS SEEN CYTOSPIN SMEAR Performed at [facility], [address], [place], [place] [address]    Report Status 08/18/2022 FINAL  Final     Scheduled Meds:  amiodarone  200 mg Oral BID   calcium acetate  667 mg Oral TID WC   Chlorhexidine  Gluconate Cloth  6 each Topical Q0600   Chlorhexidine Gluconate Cloth  6 each Topical Q0600   etomidate  10 mg Intravenous Once   insulin aspart  0-5 Units Subcutaneous QHS   insulin aspart  0-6 Units Subcutaneous TID WC   metoprolol succinate  50 mg Oral Daily   pantoprazole  40 mg Oral BID   sertraline  50 mg Oral Daily   Continuous Infusions:  iron sucrose Stopped (08/19/22 1730)   norepinephrine (LEVOPHED) Adult infusion Stopped (08/18/22 1015)    Procedures/Studies: US THORACENTESIS ASP PLEURAL SPACE W/IMG GUIDE  Result Date: 08/18/2022 INDICATION: Patient with a history of heart failure and gastric/esophageal cancer with right pleural effusion. Interventional Radiology asked to  perform a diagnostic and therapeutic thoracentesis. EXAM: ULTRASOUND GUIDED THORACENTESIS MEDICATIONS: None. COMPLICATIONS: None immediate. PROCEDURE: An ultrasound guided thoracentesis was thoroughly discussed with the patient and questions answered. The benefits, risks, alternatives and complications were also discussed. The patient understands and wishes to proceed with the procedure. Written consent was obtained. Ultrasound was performed to localize and mark an adequate pocket of fluid in the right chest. The area was then prepped and draped in the normal sterile fashion. 1% Lidocaine was used for local anesthesia. Under ultrasound guidance a 6 Fr Safe-T-Centesis catheter was introduced. Thoracentesis was performed. The catheter was removed and a dressing applied. FINDINGS: A total of approximately 1.2 L of clear yellow fluid was removed. Samples were sent to the laboratory as requested by the clinical team. IMPRESSION: Successful ultrasound guided right thoracentesis yielding 1.2 L of pleural fluid. Read by: Soyla Dryer, NP Electronically Signed   By: Titus Dubin M.D.   On: 08/18/2022 16:35   DG Chest Port 1 View  Result Date: 08/18/2022 CLINICAL DATA:  Status post right thoracentesis EXAM: PORTABLE CHEST  1 VIEW COMPARISON:  CT chest 08/18/2022 and chest radiograph 08/18/2022 FINDINGS: Previous right pleural effusion no longer readily apparent. No visible pneumothorax. Moderate enlargement of the cardiopericardial silhouette. Prior CABG. Indistinct pulmonary vasculature may reflect pulmonary venous hypertension but there is no overt edema. IMPRESSION: 1. Previous right pleural effusion no longer readily apparent. No pneumothorax. 2. Moderate enlargement of the cardiopericardial silhouette with potential pulmonary venous hypertension but no overt edema. 3. Prior CABG. Electronically Signed   By: Van Clines M.D.   On: 08/18/2022 15:34   ECHOCARDIOGRAM COMPLETE  Result Date: 08/18/2022    ECHOCARDIOGRAM REPORT   Patient Name:   [name] Date of Exam: 08/18/2022 Medical Rec #:  [id number]        Height:       70.0 in Accession #:    [id number]       Weight:       199.0 lb Date of Birth:  1956/03/22        BSA:          2.083 m Patient Age:    48 years         BP:           154/73 mmHg Patient Gender: M                HR:           82 bpm. Exam Location:  [facility] Procedure: 2D Echo, Cardiac Doppler and Color Doppler STAT ECHO Indications:    Chest Pain R07.9  History:        Patient has prior history of Echocardiogram examinations, most                 recent 02/19/2022. CHF and Cardiomyopathy, CAD, Prior CABG,                 Stroke, Arrythmias:Tachycardia, Signs/Symptoms:Shortness of                 Breath and Chest Pain; Risk Factors:Hypertension, Diabetes and                 Dyslipidemia.  Sonographer:    Alvino Chapel RCS Referring Phys: [id number] [place]  1. Left ventricular ejection fraction, by estimation, is 50%. The left ventricle has low normal function. The left ventricle has no regional wall motion abnormalities. There is moderate left ventricular hypertrophy. Left ventricular  diastolic parameters  are consistent with Grade II diastolic dysfunction (pseudonormalization).  Elevated left atrial pressure.  2. Right ventricular systolic function is normal. The right ventricular size is mildly enlarged. There is severely elevated pulmonary artery systolic pressure.  3. Left atrial size was severely dilated.  4. Right atrial size was severely dilated.  5. The mitral valve is abnormal. Trivial mitral valve regurgitation.  6. The tricuspid valve is abnormal.  7. The aortic valve has an indeterminant number of cusps. Aortic valve regurgitation is not visualized. No aortic stenosis is present.  8. The inferior vena cava is dilated in size with <50% respiratory variability, suggesting right atrial pressure of 15 mmHg. FINDINGS  Left Ventricle: Left ventricular ejection fraction, by estimation, is 50%. The left ventricle has low normal function. The left ventricle has no regional wall motion abnormalities. Definity contrast agent was given IV to delineate the left ventricular endocardial borders. The left ventricular internal cavity size was normal in size. There is moderate left ventricular hypertrophy. Left ventricular diastolic parameters are consistent with Grade II diastolic dysfunction (pseudonormalization). Elevated left atrial pressure. Right Ventricle: The right ventricular size is mildly enlarged. Right vetricular wall thickness was not well visualized. Right ventricular systolic function is normal. There is severely elevated pulmonary artery systolic pressure. The tricuspid regurgitant velocity is 3.61 m/s, and with an assumed right atrial pressure of 15 mmHg, the estimated right ventricular systolic pressure is [id number]. Left Atrium: Left atrial size was severely dilated. Right Atrium: Right atrial size was severely dilated. Pericardium: There is no evidence of pericardial effusion. Mitral Valve: The mitral valve is abnormal. There is mild thickening of the mitral valve leaflet(s). There is mild calcification of the mitral valve leaflet(s). Mild mitral annular calcification. Trivial  mitral valve regurgitation. MV peak gradient, 6.9 mmHg. The mean mitral valve gradient is 2.0 mmHg. Tricuspid Valve: The tricuspid valve is abnormal. Tricuspid valve regurgitation is mild . No evidence of tricuspid stenosis. Aortic Valve: The aortic valve has an indeterminant number of cusps. Aortic valve regurgitation is not visualized. No aortic stenosis is present. Aortic valve mean gradient measures 4.7 mmHg. Aortic valve peak gradient measures 10.8 mmHg. Aortic valve area, by VTI measures 2.29 cm. Pulmonic Valve: The pulmonic valve was not well visualized. Pulmonic valve regurgitation is not visualized. No evidence of pulmonic stenosis. Aorta: The aortic root is normal in size and structure. Venous: The inferior vena cava is dilated in size with less than 50% respiratory variability, suggesting right atrial pressure of 15 mmHg. IAS/Shunts: No atrial level shunt detected by color flow Doppler.  LEFT VENTRICLE PLAX 2D LVIDd:         5.80 cm   Diastology LVIDs:         4.30 cm   LV e' medial:    5.11 cm/s LV PW:         1.30 cm   LV E/e' medial:  26.8 LV IVS:        1.30 cm   LV e' lateral:   12.10 cm/s LVOT diam:     2.00 cm   LV E/e' lateral: 11.3 LV SV:         73 LV SV Index:   35 LVOT Area:     3.14 cm  RIGHT VENTRICLE RV S prime:     8.81 cm/s TAPSE (M-mode): 1.8 cm LEFT ATRIUM              Index        RIGHT ATRIUM  Index LA diam:        5.40 cm  2.59 cm/m   RA Area:     31.70 cm LA Vol (A2C):   141.0 ml 67.69 ml/m  RA Volume:   125.00 ml 60.01 ml/m LA Vol (A4C):   145.0 ml 69.62 ml/m LA Biplane Vol: 148.0 ml 71.06 ml/m  AORTIC VALVE AV Area (Vmax):    1.97 cm AV Area (Vmean):   2.22 cm AV Area (VTI):     2.29 cm AV Vmax:           164.56 cm/s AV Vmean:          97.590 cm/s AV VTI:            0.316 m AV Peak Grad:      10.8 mmHg AV Mean Grad:      4.7 mmHg LVOT Vmax:         103.00 cm/s LVOT Vmean:        69.000 cm/s LVOT VTI:          0.231 m LVOT/AV VTI ratio: 0.73  AORTA Ao Root diam:  3.50 cm MITRAL VALVE                TRICUSPID VALVE MV Area (PHT): 4.68 cm     TR Peak grad:   52.1 mmHg MV Area VTI:   2.15 cm     TR Vmax:        361.00 cm/s MV Peak grad:  6.9 mmHg MV Mean grad:  2.0 mmHg     SHUNTS MV Vmax:       1.31 m/s     Systemic VTI:  0.23 m MV Vmean:      64.8 cm/s    Systemic Diam: 2.00 cm MV Decel Time: 162 msec MV E velocity: 137.00 cm/s MV A velocity: 84.80 cm/s MV E/A ratio:  1.62 Carlyle Dolly MD Electronically signed by Carlyle Dolly MD Signature Date/Time: 08/18/2022/12:22:42 PM    Final    CT ABDOMEN PELVIS W CONTRAST  Result Date: 08/18/2022 CLINICAL DATA:  Chest pain. Nausea, vomiting, diarrhea. History of gastric and esophageal carcinoma. EXAM: CT ANGIOGRAPHY CHEST CT ABDOMEN AND PELVIS WITH CONTRAST TECHNIQUE: Multidetector CT imaging of the chest was performed using the standard protocol during bolus administration of intravenous contrast. Multiplanar CT image reconstructions and MIPs were obtained to evaluate the vascular anatomy. Multidetector CT imaging of the abdomen and pelvis was performed using the standard protocol during bolus administration of intravenous contrast. RADIATION DOSE REDUCTION: This exam was performed according to the departmental dose-optimization program which includes automated exposure control, adjustment of the mA and/or kV according to patient size and/or use of iterative reconstruction technique. CONTRAST:  136m OMNIPAQUE IOHEXOL 350 MG/ML SOLN COMPARISON:  May 19, 2022. FINDINGS: CTA CHEST FINDINGS Cardiovascular: Satisfactory opacification of the pulmonary arteries to the segmental level. No evidence of pulmonary embolism. Normal heart size. No pericardial effusion. Status post coronary artery bypass graft. Mediastinum/Nodes: No enlarged mediastinal, hilar, or axillary lymph nodes. Thyroid gland, trachea, and esophagus demonstrate no significant findings. Lungs/Pleura: No pneumothorax is noted. Moderate right pleural effusion is  noted with minimal adjacent subsegmental atelectasis of the right lower lobe. Left lung is unremarkable. Musculoskeletal: Nondisplaced fracture is seen involving the posterior portion of the left sixth rib. There appears to be a moderate fluid collection overlying the lateral and posterior portions of the left chest wall as well. Review of the MIP images confirms the above findings. CT ABDOMEN and  PELVIS FINDINGS Hepatobiliary: No focal liver abnormality is seen. No gallstones, gallbladder wall thickening, or biliary dilatation. Pancreas: Unremarkable. No pancreatic ductal dilatation or surrounding inflammatory changes. Spleen: Normal in size without focal abnormality. Adrenals/Urinary Tract: Adrenal glands appear normal. Bilateral renal cysts are noted for which no further follow-up is required. Mild bilateral renal atrophy is noted. No hydronephrosis or renal obstruction is noted. Urinary bladder is unremarkable. Stomach/Bowel: Stomach is within normal limits. Appendix appears normal. No evidence of bowel wall thickening, distention, or inflammatory changes. Diverticulosis of descending and sigmoid colon is noted without inflammation. Vascular/Lymphatic: Aortic atherosclerosis. No enlarged abdominal or pelvic lymph nodes. Reproductive: Stable mild prostatic enlargement is noted. Other: Small fat containing periumbilical hernia. No ascites is noted. Musculoskeletal: No acute or significant osseous findings. Review of the MIP images confirms the above findings. IMPRESSION: No definite evidence of pulmonary embolus. Status post coronary artery bypass graft. Moderate size right pleural effusion is noted with minimal adjacent subsegmental atelectasis of the right lower lobe. Nondisplaced left sixth rib fracture. Moderate sized fluid collection is seen overlying the lateral and posterior aspects of the left chest wall. Diverticulosis of descending and sigmoid colon without inflammation. Mild bilateral renal atrophy.  Stable mild prostatic enlargement. Aortic Atherosclerosis (ICD10-I70.0). Electronically Signed   By: Marijo Conception M.D.   On: 08/18/2022 12:09   CT Angio Chest PE W and/or Wo Contrast  Result Date: 08/18/2022 CLINICAL DATA:  Chest pain. Nausea, vomiting, diarrhea. History of gastric and esophageal carcinoma. EXAM: CT ANGIOGRAPHY CHEST CT ABDOMEN AND PELVIS WITH CONTRAST TECHNIQUE: Multidetector CT imaging of the chest was performed using the standard protocol during bolus administration of intravenous contrast. Multiplanar CT image reconstructions and MIPs were obtained to evaluate the vascular anatomy. Multidetector CT imaging of the abdomen and pelvis was performed using the standard protocol during bolus administration of intravenous contrast. RADIATION DOSE REDUCTION: This exam was performed according to the departmental dose-optimization program which includes automated exposure control, adjustment of the mA and/or kV according to patient size and/or use of iterative reconstruction technique. CONTRAST:  165m OMNIPAQUE IOHEXOL 350 MG/ML SOLN COMPARISON:  May 19, 2022. FINDINGS: CTA CHEST FINDINGS Cardiovascular: Satisfactory opacification of the pulmonary arteries to the segmental level. No evidence of pulmonary embolism. Normal heart size. No pericardial effusion. Status post coronary artery bypass graft. Mediastinum/Nodes: No enlarged mediastinal, hilar, or axillary lymph nodes. Thyroid gland, trachea, and esophagus demonstrate no significant findings. Lungs/Pleura: No pneumothorax is noted. Moderate right pleural effusion is noted with minimal adjacent subsegmental atelectasis of the right lower lobe. Left lung is unremarkable. Musculoskeletal: Nondisplaced fracture is seen involving the posterior portion of the left sixth rib. There appears to be a moderate fluid collection overlying the lateral and posterior portions of the left chest wall as well. Review of the MIP images confirms the above  findings. CT ABDOMEN and PELVIS FINDINGS Hepatobiliary: No focal liver abnormality is seen. No gallstones, gallbladder wall thickening, or biliary dilatation. Pancreas: Unremarkable. No pancreatic ductal dilatation or surrounding inflammatory changes. Spleen: Normal in size without focal abnormality. Adrenals/Urinary Tract: Adrenal glands appear normal. Bilateral renal cysts are noted for which no further follow-up is required. Mild bilateral renal atrophy is noted. No hydronephrosis or renal obstruction is noted. Urinary bladder is unremarkable. Stomach/Bowel: Stomach is within normal limits. Appendix appears normal. No evidence of bowel wall thickening, distention, or inflammatory changes. Diverticulosis of descending and sigmoid colon is noted without inflammation. Vascular/Lymphatic: Aortic atherosclerosis. No enlarged abdominal or pelvic lymph nodes. Reproductive: Stable mild prostatic  enlargement is noted. Other: Small fat containing periumbilical hernia. No ascites is noted. Musculoskeletal: No acute or significant osseous findings. Review of the MIP images confirms the above findings. IMPRESSION: No definite evidence of pulmonary embolus. Status post coronary artery bypass graft. Moderate size right pleural effusion is noted with minimal adjacent subsegmental atelectasis of the right lower lobe. Nondisplaced left sixth rib fracture. Moderate sized fluid collection is seen overlying the lateral and posterior aspects of the left chest wall. Diverticulosis of descending and sigmoid colon without inflammation. Mild bilateral renal atrophy. Stable mild prostatic enlargement. Aortic Atherosclerosis (ICD10-I70.0). Electronically Signed   By: Marijo Conception M.D.   On: 08/18/2022 12:09   DG Chest Portable 1 View  Result Date: 08/18/2022 CLINICAL DATA:  Chest pain, shortness of breath EXAM: PORTABLE CHEST 1 VIEW COMPARISON:  Previous studies including the examination of 06/23/2022 FINDINGS: Transverse diameter of  heart is increased. There is previous coronary bypass surgery. There is interval removal of right IJ dialysis catheter. Central pulmonary vessels are prominent. There is slight increase in interstitial markings. There is a small to moderate right pleural effusion with interval decrease. Left lateral CP angle is clear. There is no pneumothorax. IMPRESSION: Cardiomegaly. Central pulmonary vessels are prominent. There is prominence of interstitial markings suggesting possible mild interstitial edema. There are no signs of alveolar pulmonary edema. Increased density in right lower lung fields may suggest pleural effusion and underlying atelectasis. Electronically Signed   By: Elmer Picker M.D.   On: 08/18/2022 08:52    Orson Eva, DO  [facility]  If 7PM-7AM, please contact night-coverage [email or web address] Password [email or web address] 08/19/2022, 6:00 PM   LOS: 1 day

## 2022-08-19 NOTE — Plan of Care (Signed)
  Problem: Acute Rehab PT Goals(only PT should resolve) Goal: Pt Will Go Supine/Side To Sit Outcome: Progressing Flowsheets (Taken 08/19/2022 1209) Pt will go Supine/Side to Sit:  Independently  with modified independence Goal: Patient Will Transfer Sit To/From Stand Outcome: Progressing Flowsheets (Taken 08/19/2022 1209) Patient will transfer sit to/from stand:  with modified independence  with supervision Goal: Pt Will Transfer Bed To Chair/Chair To Bed Outcome: Progressing Flowsheets (Taken 08/19/2022 1209) Pt will Transfer Bed to Chair/Chair to Bed:  with modified independence  with supervision Goal: Pt Will Ambulate Outcome: Progressing Flowsheets (Taken 08/19/2022 1209) Pt will Ambulate:  75 feet  with min guard assist  with rolling walker   12:10 PM, 08/19/22 Lonell Grandchild, MPT Physical Therapist with [facility] [phone] office [phone] mobile phone
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
## 2022-08-19 NOTE — Care Management Important Message (Signed)
Important Message  Patient Details  Name: [name] MRN: [id number] Date of Birth: [birth year]   Medicare Important Message Given:  N/A - LOS <3 / Initial given by admissions     Tommy Medal 08/19/2022, 10:14 AM

## 2022-08-20 DIAGNOSIS — R079 Chest pain, unspecified: Secondary | ICD-10-CM | POA: Diagnosis not present

## 2022-08-20 DIAGNOSIS — J9621 Acute and chronic respiratory failure with hypoxia: Secondary | ICD-10-CM | POA: Diagnosis not present

## 2022-08-20 DIAGNOSIS — N186 End stage renal disease: Secondary | ICD-10-CM | POA: Diagnosis not present

## 2022-08-20 DIAGNOSIS — I471 Supraventricular tachycardia, unspecified: Secondary | ICD-10-CM | POA: Diagnosis not present

## 2022-08-20 LAB — CBC
HCT: 29.3 % — ABNORMAL LOW (ref 39.0–52.0)
Hemoglobin: 9 g/dL — ABNORMAL LOW (ref 13.0–17.0)
MCH: 28 pg (ref 26.0–34.0)
MCHC: 30.7 g/dL (ref 30.0–36.0)
MCV: 91 fL (ref 80.0–100.0)
Platelets: 268 10*3/uL (ref 150–400)
RBC: 3.22 MIL/uL — ABNORMAL LOW (ref 4.22–5.81)
RDW: 20.5 % — ABNORMAL HIGH (ref 11.5–15.5)
WBC: 9.1 10*3/uL (ref 4.0–10.5)
nRBC: 0 % (ref 0.0–0.2)

## 2022-08-20 LAB — GLUCOSE, CAPILLARY
Glucose-Capillary: 165 mg/dL — ABNORMAL HIGH (ref 70–99)
Glucose-Capillary: 188 mg/dL — ABNORMAL HIGH (ref 70–99)
Glucose-Capillary: 132 mg/dL — ABNORMAL HIGH (ref 70–99)
Glucose-Capillary: 203 mg/dL — ABNORMAL HIGH (ref 70–99)

## 2022-08-20 LAB — RENAL FUNCTION PANEL
Albumin: 2.9 g/dL — ABNORMAL LOW (ref 3.5–5.0)
Anion gap: 12 (ref 5–15)
BUN: 38 mg/dL — ABNORMAL HIGH (ref 8–23)
CO2: 26 mmol/L (ref 22–32)
Calcium: 8.9 mg/dL (ref 8.9–10.3)
Chloride: 94 mmol/L — ABNORMAL LOW (ref 98–111)
Creatinine, Ser: 5.46 mg/dL — ABNORMAL HIGH (ref 0.61–1.24)
GFR, Estimated: 11 mL/min — ABNORMAL LOW (ref >60)
Glucose, Bld: 172 mg/dL — ABNORMAL HIGH (ref 70–99)
Phosphorus: 3.2 mg/dL (ref 2.5–4.6)
Potassium: 4.3 mmol/L (ref 3.5–5.1)
Sodium: 132 mmol/L — ABNORMAL LOW (ref 135–145)

## 2022-08-20 LAB — HEPATITIS B SURFACE ANTIGEN: Hepatitis B Surface Ag: NONREACTIVE

## 2022-08-20 MED ORDER — HEPARIN SODIUM (PORCINE) 1000 UNIT/ML IJ SOLN
INTRAMUSCULAR | Status: AC
  Administered 2022-08-20: 5000 [IU] via INTRAVENOUS_CENTRAL
  Filled 2022-08-20: qty 5

## 2022-08-20 MED ORDER — PENTAFLUOROPROP-TETRAFLUOROETH EX AERO: 1.0000 | INHALATION_SPRAY | CUTANEOUS | Status: DC | PRN

## 2022-08-20 MED ORDER — LIDOCAINE-PRILOCAINE 2.5-2.5 % EX CREA: 1.0000 | TOPICAL_CREAM | CUTANEOUS | Status: DC | PRN

## 2022-08-20 MED ORDER — LIDOCAINE HCL (PF) 1 % IJ SOLN: 5.0000 mL | INTRAMUSCULAR | Status: DC | PRN

## 2022-08-20 MED ORDER — ALBUMIN HUMAN 25 % IV SOLN: 25.0000 g | INTRAVENOUS | Status: DC | PRN

## 2022-08-20 MED ORDER — HEPARIN SODIUM (PORCINE) 1000 UNIT/ML DIALYSIS
5000.0000 [IU] | Freq: Once | INTRAMUSCULAR | Status: AC
  Filled 2022-08-20: qty 5

## 2022-08-20 NOTE — TOC Progression Note (Signed)
Transition of Care [facility]) - Progression Note    Patient Details  Name: [name] MRN: [id number] Date of Birth: 1955-06-22  Transition of Care [facility]) CM/SW Contact  Joaquin Courts, RN Phone Number: 08/20/2022, 3:57 PM  Clinical Narrative:    TOC noted PT eval recommendations for SNF, recommendations discussed with patient, who declines SNF placement and wants to return home at discharge.  Patient active with [facility] prior to admission and resumptions order are in.     Expected Discharge Plan: Home/Self Care Barriers to Discharge: Continued Medical Work up  Expected Discharge Plan and Services In-house Referral: Clinical Social Work     Living arrangements for the past 2 months: [place]: PT [place]: [place] Date [place]: 08/19/22 Time [place]: [phone] Representative spoke with at [place]: [employer] Determinants of Health ([place]) Interventions [place]: No Food Insecurity (08/18/2022)  Housing: Low Risk  (08/18/2022)  Transportation Needs: Unmet Transportation Needs (08/18/2022)  Utilities: Not At Risk (08/18/2022)  Alcohol Screen: Low Risk  (04/21/2022)  Depression (PHQ2-9): Medium Risk (08/11/2022)  Financial Resource Strain: Low Risk  (04/21/2022)  Physical Activity: Inactive (04/21/2022)  Social Connections: Unknown (04/21/2022)  Stress: No Stress Concern Present (04/21/2022)  Tobacco Use: Medium Risk (08/18/2022)    Readmission Risk Interventions    08/19/2022    8:11 AM 05/04/2022    9:06 AM 03/01/2022    8:14 AM  Readmission Risk Prevention Plan  Transportation Screening Complete Complete Complete  Medication Review (RN Care Manager) Complete Complete Complete  HRI or [place] Complete Complete Complete  SW Recovery Care/Counseling Consult Complete Complete Complete  Palliative Care Screening Not Applicable Not Applicable Not [place] Not Applicable Not Applicable Not Applicable

## 2022-08-20 NOTE — Progress Notes (Signed)
PROGRESS NOTE  [name] [id number] DOB: 1955/07/01 DOA: 08/18/2022 PCP: [name], MD  Brief History:   67 year old male with a history of coronary disease status post CABG, systolic and diastolic CHF, diabetes mellitus type 2, hypertension, ESRD, SVT, esophageal adenocarcinoma, and OSA presenting with substernal chest pain and shortness of breath.  The patient states that he began developing substernal chest discomfort around 2 AM on 08/18/2022 about 30 to 60 minutes after eating toast and jelly.  Notably, the patient has had intermittent nausea and vomiting for the past week.  He had 4 episodes of nonbloody emesis on 08/17/2022.  In addition, the patient has been complaining of some worsening shortness of breath this past week. He denies any fevers, chills, coughing, hemoptysis, hematemesis,.  He endorses compliance with dialysis on Monday, Wednesday, and Fridays.  His last dialysis was on Wednesday, 08/17/2022.  Because of his chest pain shortness of breath, the patient presented for further evaluation and treatment. Notably, the patient is supposed to be on 2 L nasal cannula at home, but he has been out of oxygen for a couple weeks.  The patient also has long history of gait instability and falls.  He states that he falls about once per week, last fall on 08/12/2022. The patient has had numerous hospital admissions.  Most recently he was hospitalized for about 24 hours on 06/23/2022 for atypical chest pain.  He was discharged home in stable condition.  He had another hospital admission from 05/27/2022 to 05/31/2022 for hematemesis.  EGD on 05/31/2023 showed moderate severe esophagitis without any active bleeding.  In the ED, the patient was afebrile.  He went into SVT and became hypotensive.  He was started on amiodarone drip.  He required Levophed for short period of time.  He spontaneously converted to sinus rhythm.  He was placed on 4 L nasal cannula with oxygen saturation 100%.   WBC 10.5, hemoglobin 8.6, platelets 216,000.  Sodium 133, potassium 3.6, bicarbonate 29, serum creatinine 3.53.  Lactic acid 1.6.  PCT 0.40.  D-dimer 2.83.  EKG initially showed SVT with nonspecific ST changes. CTA chest was negative for PE but showed a moderate right pleural effusion with atelectasis.  There is nondisplaced left sixth rib fracture.  There is also fluid collection in the left chest wall.  CT of the abdomen was negative for any acute findings.    Assessment/Plan: Chest pain -mostly atypical by history -related to his GI issues, esophageal cancer, vomiting -troponins flat --08/18/22 Echo EF 50%, no WMA, no WMA, normal RVF, PASP 67.1   SVT -previously on  amiodarone 200 mg daily -became hypotensive and required levophed short time  -placed on amio drip>>converted to sinus --08/18/22 Echo EF 50%, no WMA, no WMA, normal RVF, PASP 67.1 -appreciate cardiology consult -3/8 transitioned to po amiodarone bid; remains in sinus   Intractable nausea and vomiting -related to his esophagitis/esophageal adenocarcinoma -08/04/22 EGD--Dr. Jerilynn Mages C reflux esophagitis, medium hiatus hernia;  PATHOLOGY>>Cardio-oxyntic mucosa with moderate chronic active gastritis and proton pump inhibitor (PPI) effect.  NO dysplasia or metaplasia on esophageal biopsy -Dr. Evangeline Gula increased omeprazole to TID -continue pantoprazole -clear liquid diet for now>>tolerated -advance to full liquids>>tolerated -3/9 advance to carb modified diet   Acute on chronic respiratory failure with hypoxia -chronically on 2L at home but "ran out" oxygen last 2-3 weeks -now on 4L -wean back to 2L -started on home oxygen after his 12/15-12/19/23 admission -due to fluid  overload and right pleural effusion -08/18/22 CTA chest--mod R-pleural effusion, no PE, nondisplaced left 6th rib fracture   Chronic combined systolic and diastolic CHF -[id number] Echo--EF 40-45%, no WMA, mod RV dysfx, PASP 56.5  -03/01/22 Echo--EF 40-45%,  global HK, RV overload  -08/18/22 Echo EF 50%, no WMA, normal RVF, PASP 67.1 -Fluid removal via dialysis   Acute Blood Loss Anemia -06/23/22 Hgb 11.7 -presented with Hgb 8.6>>Hgb remains stable -FOBT negative -due to fall and ecchymosis/hematoma on left chest/flank   ESRD -He dialyzes on Monday, Wednesday, Friday -Consulted nephrology for maintenance dialysis -He was started on dialysis sometime in August 2023 -Nephrology consulted for maintenance dialysis -last dialyzed on 08/16/32 -HD 3/9>>3.3L removed   Right pleural effusion -This has been chronic -He has had previous thoracocentesis in the past, last done 03/01/2022 -request thora 3/7>>1.2L removed -pleural fluid transudative   S/P CABG (coronary artery bypass graft) Recent cardiac cath 01/2021, showed patent grafts, severe three-vessel native CAD.Marland Kitchen  History of CABG 2020.   Essential hypertension -Restarted metoprolol succinate -Holding hydralazine and doxazosin for soft blood pressures initially   Esophageal adenocarcinoma (HCC) Follows GI at [facility], Dr. [name] --considered poor candidate for esophagectomy. After multidisciplinary discussion the plan was to continue with endoscopic treatments, and continue to follow-up with oncology for surveillance and any signs of progression of disease -status post EMR, cryotherapy, and most recently RFA in April 2023 -10/01/21 PET scan--no local recurrence -08/04/22 EGD--Dr. Jerilynn Mages C reflux esophagitis, medium hiatus hernia;  PATHOLOGY>>Cardio-oxyntic mucosa with moderate chronic active gastritis and proton pump inhibitor (PPI) effect.  NO dysplasia or metaplasia on esophageal biopsy -Dr. Evangeline Gula increased omeprazole to TID   Uncontrolled diabetes mellitus type 2 with hyperglycemia -01/31/22 A1C--7.7 -novolog sliding scale -08/18/22 A1C--8.1             Family Communication: no  Family at bedside   Consultants:  renal; cardiology   Code Status:  FULL    DVT Prophylaxis:   SCDs     Procedures: As Listed in Progress Note Above   Antibiotics: None     Subjective: Patient denies fevers, chills, headache, chest pain, dyspnea, nausea, vomiting, diarrhea, abdominal pain, dysuria, hematuria, hematochezia, and melena.   Objective: Vitals:   08/20/22 1530 08/20/22 1600 08/20/22 1615 08/20/22 1700  BP: 130/63 (!) 123/55 (!) 132/57 (!) 140/65  Pulse:    73  Resp: '17 20 20 19  '$ Temp:    98.1 F (36.7 C)  TempSrc:    Oral  SpO2:    99%  Weight:    86.5 kg  Height:        Intake/Output Summary (Last 24 hours) at 08/20/2022 1743 Last data filed at 08/20/2022 1700 Gross per 24 hour  Intake 360 ml  Output 3500 ml  Net -3140 ml   Weight change: -0.366 kg Exam:  General:  Pt is alert, follows commands appropriately, not in acute distress HEENT: No icterus, No thrush, No neck mass, [place]/AT Cardiovascular: RRR, S1/S2, no rubs, no gallops Respiratory: fine bibasilar rales. No wheeze Abdomen: Soft/+BS, non tender, non distended, no guarding Extremities: No edema, No lymphangitis, No petechiae, No rashes, no synovitis   Data Reviewed: I have personally reviewed following labs and imaging studies Basic Metabolic Panel: Recent Labs  Lab 08/18/22 0849 08/19/22 0500 08/20/22 0543  NA 133* 132* 132*  K 3.6 4.0 4.3  CL 92* 92* 94*  CO2 '29 26 26  '$ GLUCOSE 321* 281* 172*  BUN 25* 28* 38*  CREATININE 3.53* 4.23* 5.46*  CALCIUM  10.1 9.4 8.9  MG 3.1*  --   --   PHOS  --  2.9 3.2   Liver Function Tests: Recent Labs  Lab 08/18/22 0849 08/19/22 0416 08/19/22 0500 08/20/22 0543  AST 12* 9*  --   --   ALT 8 7  --   --   ALKPHOS 113 89  --   --   BILITOT 1.1 1.2  --   --   PROT 6.3* 5.8*  --   --   ALBUMIN 3.2* 2.9* 2.9* 2.9*   Recent Labs  Lab 08/18/22 0849  LIPASE 52*   No results for input(s): "AMMONIA" in the last 168 hours. Coagulation Profile: Recent Labs  Lab 08/18/22 0849  INR 1.1   CBC: Recent Labs  Lab 08/18/22 0849  08/19/22 0416 08/20/22 0543  WBC 10.5 7.9 9.1  HGB 8.6* 8.6* 9.0*  HCT 28.0* 28.5* 29.3*  MCV 90.0 90.8 91.0  PLT 316 278 268   Cardiac Enzymes: No results for input(s): "CKTOTAL", "CKMB", "CKMBINDEX", "TROPONINI" in the last 168 hours. BNP: Invalid input(s): "POCBNP" CBG: Recent Labs  Lab 08/19/22 1644 08/19/22 2027 08/20/22 0117 08/20/22 0750 08/20/22 1728  GLUCAP 259* 154* 165* 188* 132*   HbA1C: Recent Labs    08/18/22 1517  HGBA1C 8.1*   Urine analysis:    Component Value Date/Time   COLORURINE YELLOW 02/28/2022 1414   APPEARANCEUR CLEAR 02/28/2022 1414   LABSPEC 1.014 02/28/2022 1414   PHURINE 5.0 02/28/2022 1414   GLUCOSEU >=500 (A) 02/28/2022 1414   HGBUR NEGATIVE 02/28/2022 1414   BILIRUBINUR NEGATIVE 02/28/2022 1414   KETONESUR NEGATIVE 02/28/2022 1414   PROTEINUR >=300 (A) 02/28/2022 1414   NITRITE NEGATIVE 02/28/2022 1414   LEUKOCYTESUR NEGATIVE 02/28/2022 1414   Sepsis Labs: '@LABRCNTIP'$ (procalcitonin:4,lacticidven:4) ) Recent Results (from the past 240 hour(s))  MRSA Next Gen by PCR, Nasal     Status: None   Collection Time: 08/18/22  2:00 PM   Specimen: Nasal Mucosa; Nasal Swab  Result Value Ref Range Status   MRSA by PCR Next Gen NOT DETECTED NOT DETECTED Final    Comment: (NOTE) The GeneXpert MRSA Assay (FDA approved for NASAL specimens only), is one component of a comprehensive MRSA colonization surveillance program. It is not intended to diagnose MRSA infection nor to guide or monitor treatment for MRSA infections. Test performance is not FDA approved in patients less than 58 years old. Performed at [facility], [address] [place], [place] [address]   Culture, body fluid w Gram Stain-bottle     Status: None (Preliminary result)   Collection Time: 08/18/22  3:00 PM   Specimen: Pleura  Result Value Ref Range Status   Specimen Description PLEURAL  Final   Special Requests BAA 10CC  Final   Culture   Final    NO GROWTH 2  DAYS Performed at [facility], [address] [place], [place] [address]    Report Status PENDING  Incomplete  Gram stain     Status: None   Collection Time: 08/18/22  3:00 PM   Specimen: Pleura  Result Value Ref Range Status   Specimen Description PLEURAL  Final   Special Requests NONE  Final   Gram Stain   Final    WBC PRESENT, PREDOMINANTLY MONONUCLEAR NO ORGANISMS SEEN CYTOSPIN SMEAR Performed at [facility], [address] [place],  [address]    Report Status 08/18/2022 FINAL  Final     Scheduled Meds:  amiodarone  200 mg Oral BID  calcium acetate  667 mg Oral TID WC   Chlorhexidine Gluconate Cloth  6 each Topical Q0600   etomidate  10 mg Intravenous Once   insulin aspart  0-5 Units Subcutaneous QHS   insulin aspart  0-6 Units Subcutaneous TID WC   metoprolol succinate  50 mg Oral Daily   pantoprazole  40 mg Oral BID   sertraline  50 mg Oral Daily   Continuous Infusions:  albumin human     iron sucrose Stopped (08/19/22 1730)   norepinephrine (LEVOPHED) Adult infusion Stopped (08/18/22 1015)    Procedures/Studies: US THORACENTESIS ASP PLEURAL SPACE W/IMG GUIDE  Result Date: 08/18/2022 INDICATION: Patient with a history of heart failure and gastric/esophageal cancer with right pleural effusion. Interventional Radiology asked to perform a diagnostic and therapeutic thoracentesis. EXAM: ULTRASOUND GUIDED THORACENTESIS MEDICATIONS: None. COMPLICATIONS: None immediate. PROCEDURE: An ultrasound guided thoracentesis was thoroughly discussed with the patient and questions answered. The benefits, risks, alternatives and complications were also discussed. The patient understands and wishes to proceed with the procedure. Written consent was obtained. Ultrasound was performed to localize and mark an adequate pocket of fluid in the right chest. The area was then prepped and draped in the normal sterile fashion. 1% Lidocaine was used for local anesthesia. Under ultrasound  guidance a 6 Fr Safe-T-Centesis catheter was introduced. Thoracentesis was performed. The catheter was removed and a dressing applied. FINDINGS: A total of approximately 1.2 L of clear yellow fluid was removed. Samples were sent to the laboratory as requested by the clinical team. IMPRESSION: Successful ultrasound guided right thoracentesis yielding 1.2 L of pleural fluid. Read by: Soyla Dryer, NP Electronically Signed   By: Titus Dubin M.D.   On: 08/18/2022 16:35   DG Chest Port 1 View  Result Date: 08/18/2022 CLINICAL DATA:  Status post right thoracentesis EXAM: PORTABLE CHEST 1 VIEW COMPARISON:  CT chest 08/18/2022 and chest radiograph 08/18/2022 FINDINGS: Previous right pleural effusion no longer readily apparent. No visible pneumothorax. Moderate enlargement of the cardiopericardial silhouette. Prior CABG. Indistinct pulmonary vasculature may reflect pulmonary venous hypertension but there is no overt edema. IMPRESSION: 1. Previous right pleural effusion no longer readily apparent. No pneumothorax. 2. Moderate enlargement of the cardiopericardial silhouette with potential pulmonary venous hypertension but no overt edema. 3. Prior CABG. Electronically Signed   By: Van Clines M.D.   On: 08/18/2022 15:34   ECHOCARDIOGRAM COMPLETE  Result Date: 08/18/2022    ECHOCARDIOGRAM REPORT   Patient Name:   JAMARRI DORING Date of Exam: 08/18/2022 Medical Rec #:  [id number]        Height:       70.0 in Accession #:    [id number]       Weight:       199.0 lb Date of Birth:  05/05/1956        BSA:          2.083 m Patient Age:    38 years         BP:           154/73 mmHg Patient Gender: M                HR:           82 bpm. Exam Location:  [facility] Procedure: 2D Echo, Cardiac Doppler and Color Doppler STAT ECHO Indications:    Chest Pain R07.9  History:        Patient has prior history of Echocardiogram examinations, most  recent 02/19/2022. CHF and Cardiomyopathy, CAD, Prior CABG,                  Stroke, Arrythmias:Tachycardia, Signs/Symptoms:Shortness of                 Breath and Chest Pain; Risk Factors:Hypertension, Diabetes and                 Dyslipidemia.  Sonographer:    Alvino Chapel RCS Referring Phys: [id number] [place]  1. Left ventricular ejection fraction, by estimation, is 50%. The left ventricle has low normal function. The left ventricle has no regional wall motion abnormalities. There is moderate left ventricular hypertrophy. Left ventricular diastolic parameters  are consistent with Grade II diastolic dysfunction (pseudonormalization). Elevated left atrial pressure.  2. Right ventricular systolic function is normal. The right ventricular size is mildly enlarged. There is severely elevated pulmonary artery systolic pressure.  3. Left atrial size was severely dilated.  4. Right atrial size was severely dilated.  5. The mitral valve is abnormal. Trivial mitral valve regurgitation.  6. The tricuspid valve is abnormal.  7. The aortic valve has an indeterminant number of cusps. Aortic valve regurgitation is not visualized. No aortic stenosis is present.  8. The inferior vena cava is dilated in size with <50% respiratory variability, suggesting right atrial pressure of 15 mmHg. FINDINGS  Left Ventricle: Left ventricular ejection fraction, by estimation, is 50%. The left ventricle has low normal function. The left ventricle has no regional wall motion abnormalities. Definity contrast agent was given IV to delineate the left ventricular endocardial borders. The left ventricular internal cavity size was normal in size. There is moderate left ventricular hypertrophy. Left ventricular diastolic parameters are consistent with Grade II diastolic dysfunction (pseudonormalization). Elevated left atrial pressure. Right Ventricle: The right ventricular size is mildly enlarged. Right vetricular wall thickness was not well visualized. Right ventricular systolic function is normal.  There is severely elevated pulmonary artery systolic pressure. The tricuspid regurgitant velocity is 3.61 m/s, and with an assumed right atrial pressure of 15 mmHg, the estimated right ventricular systolic pressure is [id number]. Left Atrium: Left atrial size was severely dilated. Right Atrium: Right atrial size was severely dilated. Pericardium: There is no evidence of pericardial effusion. Mitral Valve: The mitral valve is abnormal. There is mild thickening of the mitral valve leaflet(s). There is mild calcification of the mitral valve leaflet(s). Mild mitral annular calcification. Trivial mitral valve regurgitation. MV peak gradient, 6.9 mmHg. The mean mitral valve gradient is 2.0 mmHg. Tricuspid Valve: The tricuspid valve is abnormal. Tricuspid valve regurgitation is mild . No evidence of tricuspid stenosis. Aortic Valve: The aortic valve has an indeterminant number of cusps. Aortic valve regurgitation is not visualized. No aortic stenosis is present. Aortic valve mean gradient measures 4.7 mmHg. Aortic valve peak gradient measures 10.8 mmHg. Aortic valve area, by VTI measures 2.29 cm. Pulmonic Valve: The pulmonic valve was not well visualized. Pulmonic valve regurgitation is not visualized. No evidence of pulmonic stenosis. Aorta: The aortic root is normal in size and structure. Venous: The inferior vena cava is dilated in size with less than 50% respiratory variability, suggesting right atrial pressure of 15 mmHg. IAS/Shunts: No atrial level shunt detected by color flow Doppler.  LEFT VENTRICLE PLAX 2D LVIDd:         5.80 cm   Diastology LVIDs:         4.30 cm   LV e' medial:    5.11 cm/s LV  PW:         1.30 cm   LV E/e' medial:  26.8 LV IVS:        1.30 cm   LV e' lateral:   12.10 cm/s LVOT diam:     2.00 cm   LV E/e' lateral: 11.3 LV SV:         73 LV SV Index:   35 LVOT Area:     3.14 cm  RIGHT VENTRICLE RV S prime:     8.81 cm/s TAPSE (M-mode): 1.8 cm LEFT ATRIUM              Index        RIGHT ATRIUM            Index LA diam:        5.40 cm  2.59 cm/m   RA Area:     31.70 cm LA Vol (A2C):   141.0 ml 67.69 ml/m  RA Volume:   125.00 ml 60.01 ml/m LA Vol (A4C):   145.0 ml 69.62 ml/m LA Biplane Vol: 148.0 ml 71.06 ml/m  AORTIC VALVE AV Area (Vmax):    1.97 cm AV Area (Vmean):   2.22 cm AV Area (VTI):     2.29 cm AV Vmax:           164.56 cm/s AV Vmean:          97.590 cm/s AV VTI:            0.316 m AV Peak Grad:      10.8 mmHg AV Mean Grad:      4.7 mmHg LVOT Vmax:         103.00 cm/s LVOT Vmean:        69.000 cm/s LVOT VTI:          0.231 m LVOT/AV VTI ratio: 0.73  AORTA Ao Root diam: 3.50 cm MITRAL VALVE                TRICUSPID VALVE MV Area (PHT): 4.68 cm     TR Peak grad:   52.1 mmHg MV Area VTI:   2.15 cm     TR Vmax:        361.00 cm/s MV Peak grad:  6.9 mmHg MV Mean grad:  2.0 mmHg     SHUNTS MV Vmax:       1.31 m/s     Systemic VTI:  0.23 m MV Vmean:      64.8 cm/s    Systemic Diam: 2.00 cm MV Decel Time: 162 msec MV E velocity: 137.00 cm/s MV A velocity: 84.80 cm/s MV E/A ratio:  1.62 Carlyle Dolly MD Electronically signed by Carlyle Dolly MD Signature Date/Time: 08/18/2022/12:22:42 PM    Final    CT ABDOMEN PELVIS W CONTRAST  Result Date: 08/18/2022 CLINICAL DATA:  Chest pain. Nausea, vomiting, diarrhea. History of gastric and esophageal carcinoma. EXAM: CT ANGIOGRAPHY CHEST CT ABDOMEN AND PELVIS WITH CONTRAST TECHNIQUE: Multidetector CT imaging of the chest was performed using the standard protocol during bolus administration of intravenous contrast. Multiplanar CT image reconstructions and MIPs were obtained to evaluate the vascular anatomy. Multidetector CT imaging of the abdomen and pelvis was performed using the standard protocol during bolus administration of intravenous contrast. RADIATION DOSE REDUCTION: This exam was performed according to the departmental dose-optimization program which includes automated exposure control, adjustment of the mA and/or kV according to patient size  and/or use of iterative reconstruction technique. CONTRAST:  130m OMNIPAQUE IOHEXOL 350 MG/ML  SOLN COMPARISON:  May 19, 2022. FINDINGS: CTA CHEST FINDINGS Cardiovascular: Satisfactory opacification of the pulmonary arteries to the segmental level. No evidence of pulmonary embolism. Normal heart size. No pericardial effusion. Status post coronary artery bypass graft. Mediastinum/Nodes: No enlarged mediastinal, hilar, or axillary lymph nodes. Thyroid gland, trachea, and esophagus demonstrate no significant findings. Lungs/Pleura: No pneumothorax is noted. Moderate right pleural effusion is noted with minimal adjacent subsegmental atelectasis of the right lower lobe. Left lung is unremarkable. Musculoskeletal: Nondisplaced fracture is seen involving the posterior portion of the left sixth rib. There appears to be a moderate fluid collection overlying the lateral and posterior portions of the left chest wall as well. Review of the MIP images confirms the above findings. CT ABDOMEN and PELVIS FINDINGS Hepatobiliary: No focal liver abnormality is seen. No gallstones, gallbladder wall thickening, or biliary dilatation. Pancreas: Unremarkable. No pancreatic ductal dilatation or surrounding inflammatory changes. Spleen: Normal in size without focal abnormality. Adrenals/Urinary Tract: Adrenal glands appear normal. Bilateral renal cysts are noted for which no further follow-up is required. Mild bilateral renal atrophy is noted. No hydronephrosis or renal obstruction is noted. Urinary bladder is unremarkable. Stomach/Bowel: Stomach is within normal limits. Appendix appears normal. No evidence of bowel wall thickening, distention, or inflammatory changes. Diverticulosis of descending and sigmoid colon is noted without inflammation. Vascular/Lymphatic: Aortic atherosclerosis. No enlarged abdominal or pelvic lymph nodes. Reproductive: Stable mild prostatic enlargement is noted. Other: Small fat containing periumbilical  hernia. No ascites is noted. Musculoskeletal: No acute or significant osseous findings. Review of the MIP images confirms the above findings. IMPRESSION: No definite evidence of pulmonary embolus. Status post coronary artery bypass graft. Moderate size right pleural effusion is noted with minimal adjacent subsegmental atelectasis of the right lower lobe. Nondisplaced left sixth rib fracture. Moderate sized fluid collection is seen overlying the lateral and posterior aspects of the left chest wall. Diverticulosis of descending and sigmoid colon without inflammation. Mild bilateral renal atrophy. Stable mild prostatic enlargement. Aortic Atherosclerosis (ICD10-I70.0). Electronically Signed   By: Marijo Conception M.D.   On: 08/18/2022 12:09   CT Angio Chest PE W and/or Wo Contrast  Result Date: 08/18/2022 CLINICAL DATA:  Chest pain. Nausea, vomiting, diarrhea. History of gastric and esophageal carcinoma. EXAM: CT ANGIOGRAPHY CHEST CT ABDOMEN AND PELVIS WITH CONTRAST TECHNIQUE: Multidetector CT imaging of the chest was performed using the standard protocol during bolus administration of intravenous contrast. Multiplanar CT image reconstructions and MIPs were obtained to evaluate the vascular anatomy. Multidetector CT imaging of the abdomen and pelvis was performed using the standard protocol during bolus administration of intravenous contrast. RADIATION DOSE REDUCTION: This exam was performed according to the departmental dose-optimization program which includes automated exposure control, adjustment of the mA and/or kV according to patient size and/or use of iterative reconstruction technique. CONTRAST:  121m OMNIPAQUE IOHEXOL 350 MG/ML SOLN COMPARISON:  May 19, 2022. FINDINGS: CTA CHEST FINDINGS Cardiovascular: Satisfactory opacification of the pulmonary arteries to the segmental level. No evidence of pulmonary embolism. Normal heart size. No pericardial effusion. Status post coronary artery bypass graft.  Mediastinum/Nodes: No enlarged mediastinal, hilar, or axillary lymph nodes. Thyroid gland, trachea, and esophagus demonstrate no significant findings. Lungs/Pleura: No pneumothorax is noted. Moderate right pleural effusion is noted with minimal adjacent subsegmental atelectasis of the right lower lobe. Left lung is unremarkable. Musculoskeletal: Nondisplaced fracture is seen involving the posterior portion of the left sixth rib. There appears to be a moderate fluid collection overlying the lateral and posterior portions of the left  chest wall as well. Review of the MIP images confirms the above findings. CT ABDOMEN and PELVIS FINDINGS Hepatobiliary: No focal liver abnormality is seen. No gallstones, gallbladder wall thickening, or biliary dilatation. Pancreas: Unremarkable. No pancreatic ductal dilatation or surrounding inflammatory changes. Spleen: Normal in size without focal abnormality. Adrenals/Urinary Tract: Adrenal glands appear normal. Bilateral renal cysts are noted for which no further follow-up is required. Mild bilateral renal atrophy is noted. No hydronephrosis or renal obstruction is noted. Urinary bladder is unremarkable. Stomach/Bowel: Stomach is within normal limits. Appendix appears normal. No evidence of bowel wall thickening, distention, or inflammatory changes. Diverticulosis of descending and sigmoid colon is noted without inflammation. Vascular/Lymphatic: Aortic atherosclerosis. No enlarged abdominal or pelvic lymph nodes. Reproductive: Stable mild prostatic enlargement is noted. Other: Small fat containing periumbilical hernia. No ascites is noted. Musculoskeletal: No acute or significant osseous findings. Review of the MIP images confirms the above findings. IMPRESSION: No definite evidence of pulmonary embolus. Status post coronary artery bypass graft. Moderate size right pleural effusion is noted with minimal adjacent subsegmental atelectasis of the right lower lobe. Nondisplaced left  sixth rib fracture. Moderate sized fluid collection is seen overlying the lateral and posterior aspects of the left chest wall. Diverticulosis of descending and sigmoid colon without inflammation. Mild bilateral renal atrophy. Stable mild prostatic enlargement. Aortic Atherosclerosis (ICD10-I70.0). Electronically Signed   By: Marijo Conception M.D.   On: 08/18/2022 12:09   DG Chest Portable 1 View  Result Date: 08/18/2022 CLINICAL DATA:  Chest pain, shortness of breath EXAM: PORTABLE CHEST 1 VIEW COMPARISON:  Previous studies including the examination of 06/23/2022 FINDINGS: Transverse diameter of heart is increased. There is previous coronary bypass surgery. There is interval removal of right IJ dialysis catheter. Central pulmonary vessels are prominent. There is slight increase in interstitial markings. There is a small to moderate right pleural effusion with interval decrease. Left lateral CP angle is clear. There is no pneumothorax. IMPRESSION: Cardiomegaly. Central pulmonary vessels are prominent. There is prominence of interstitial markings suggesting possible mild interstitial edema. There are no signs of alveolar pulmonary edema. Increased density in right lower lung fields may suggest pleural effusion and underlying atelectasis. Electronically Signed   By: Elmer Picker M.D.   On: 08/18/2022 08:52    Orson Eva, DO  [facility]  If 7PM-7AM, please contact night-coverage [email or web address] Password [facility] 08/20/2022, 5:43 PM   LOS: 2 days

## 2022-08-20 NOTE — Progress Notes (Signed)
  HEMODIALYSIS TREATMENT NOTE:  Uneventful 3.75 hour low-heparin treatment completed using left upper arm AVF.  Goal met: 3.3 liters removed without interruption in UF. Hemodynamically stable throughout session. All blood was returned and hemostasis was achieved in 30 minutes.  Post-dialysis:  08/20/22 1700  Vital Signs  Temp 98.1 F (36.7 C)  Temp Source Oral  Pulse Rate 73  Pulse Rate Source Monitor  Resp 19  BP (!) 140/65  BP Location Right Arm  BP Method Automatic  Patient Position (if appropriate) Lying  Oxygen Therapy  SpO2 99 %  O2 Device Room Air  Pain Assessment  Pain Scale 0-10  Pain Score 0  Dialysis Weight  Weight 86.5 kg  Type of Weight Post-Dialysis  Post Treatment  Dialyzer Clearance Clear  Duration of HD Treatment -hour(s) 3.7 hour(s)  Hemodialysis Intake (mL) 0 mL  Liters Processed 70.3  Fluid Removed (mL) 3300 mL  Tolerated HD Treatment Yes  Post-Hemodialysis Comments Goal met  AVG/AVF Arterial Site Held (minutes) 15 minutes  AVG/AVF Venous Site Held (minutes) 15 minutes  Fistula / Graft Left Forearm Arteriovenous fistula  Placement Date/Time: 12/21/21 1112   Placed prior to admission: No  Orientation: Left  Access Location: Forearm  Access Type: (c) Arteriovenous fistula  Site Condition No complications  Fistula / Graft Assessment Thrill;Bruit  Status Patent    Rockwell Alexandria, RN AP [facility]

## 2022-08-21 DIAGNOSIS — R079 Chest pain, unspecified: Secondary | ICD-10-CM | POA: Diagnosis not present

## 2022-08-21 DIAGNOSIS — N186 End stage renal disease: Secondary | ICD-10-CM | POA: Diagnosis not present

## 2022-08-21 DIAGNOSIS — I471 Supraventricular tachycardia, unspecified: Secondary | ICD-10-CM | POA: Diagnosis not present

## 2022-08-21 DIAGNOSIS — Z992 Dependence on renal dialysis: Secondary | ICD-10-CM | POA: Diagnosis not present

## 2022-08-21 LAB — HEPATITIS B SURFACE ANTIBODY, QUANTITATIVE: Hep B S AB Quant (Post): 35.4 m[IU]/mL (ref >9.9)

## 2022-08-21 LAB — GLUCOSE, CAPILLARY
Glucose-Capillary: 164 mg/dL — ABNORMAL HIGH (ref 70–99)
Glucose-Capillary: 272 mg/dL — ABNORMAL HIGH (ref 70–99)

## 2022-08-21 MED ORDER — AMIODARONE HCL 200 MG PO TABS: 200.0000 mg | ORAL_TABLET | Freq: Two times a day (BID) | ORAL | 0 refills | Status: DC

## 2022-08-21 NOTE — Discharge Summary (Signed)
Physician Discharge Summary   Patient: [name] MRN: [id number] DOB: Oct 29, 1955  Admit date:     08/18/2022  Discharge date: 08/21/22  Discharge Physician: Shanon Brow Clover Feehan   PCP: [name], MD   Recommendations at discharge:   Please follow up with primary care provider within 1-2 weeks  Please repeat BMP and CBC in one week     Hospital Course:  67 year old male with a history of coronary disease status post CABG, systolic and diastolic CHF, diabetes mellitus type 2, hypertension, ESRD, SVT, esophageal adenocarcinoma, and OSA presenting with substernal chest pain and shortness of breath.  The patient states that he began developing substernal chest discomfort around 2 AM on 08/18/2022 about 30 to 60 minutes after eating toast and jelly.  Notably, the patient has had intermittent nausea and vomiting for the past week.  He had 4 episodes of nonbloody emesis on 08/17/2022.  In addition, the patient has been complaining of some worsening shortness of breath this past week. He denies any fevers, chills, coughing, hemoptysis, hematemesis,.  He endorses compliance with dialysis on Monday, Wednesday, and Fridays.  His last dialysis was on Wednesday, 08/17/2022.  Because of his chest pain shortness of breath, the patient presented for further evaluation and treatment. Notably, the patient is supposed to be on 2 L nasal cannula at home, but he has been out of oxygen for a couple weeks.  The patient also has long history of gait instability and falls.  He states that he falls about once per week, last fall on 08/12/2022. The patient has had numerous hospital admissions.  Most recently he was hospitalized for about 24 hours on 06/23/2022 for atypical chest pain.  He was discharged home in stable condition.  He had another hospital admission from 05/27/2022 to 05/31/2022 for hematemesis.  EGD on 05/31/2023 showed moderate severe esophagitis without any active bleeding.  In the ED, the patient was afebrile.   He went into SVT and became hypotensive.  He was started on amiodarone drip.  He required Levophed for short period of time.  He spontaneously converted to sinus rhythm.  He was placed on 4 L nasal cannula with oxygen saturation 100%.  WBC 10.5, hemoglobin 8.6, platelets 216,000.  Sodium 133, potassium 3.6, bicarbonate 29, serum creatinine 3.53.  Lactic acid 1.6.  PCT 0.40.  D-dimer 2.83.  EKG initially showed SVT with nonspecific ST changes. CTA chest was negative for PE but showed a moderate right pleural effusion with atelectasis.  There is nondisplaced left sixth rib fracture.  There is also fluid collection in the left chest wall.  CT of the abdomen was negative for any acute findings.   Assessment and Plan: Chest pain -mostly atypical by history -related to his GI issues, esophageal cancer, vomiting -troponins flat --08/18/22 Echo EF 50%, no WMA, no WMA, normal RVF, PASP 67.1 -resolved when his emesis improved   SVT -previously on  amiodarone 200 mg daily -became hypotensive and required levophed short time  -placed on amio drip>>converted to sinus --08/18/22 Echo EF 50%, no WMA, no WMA, normal RVF, PASP 67.1 -appreciate cardiology consult -3/8 transitioned to po amiodarone bid; remains in sinus -amiodarone 200 mg bid through 09/08/22, then 200 mg daily starting 09/09/22   Intractable nausea and vomiting -related to his esophagitis/esophageal adenocarcinoma -08/04/22 EGD--Dr. Jerilynn Mages C reflux esophagitis, medium hiatus hernia;  PATHOLOGY>>Cardio-oxyntic mucosa with moderate chronic active gastritis and proton pump inhibitor (PPI) effect.  NO dysplasia or metaplasia on esophageal biopsy -Dr. Evangeline Gula increased omeprazole  to TID -continue pantoprazole -clear liquid diet for now>>tolerated -advance to full liquids>>tolerated -3/9 advance to carb modified diet>>tolerating well   Acute on chronic respiratory failure with hypoxia -chronically on 2L at home but "ran out" oxygen last 2-3  weeks -now on 4L -wean back to 2L -started on home oxygen after his 12/15-12/19/23 admission -due to fluid overload and right pleural effusion -08/18/22 CTA chest--mod R-pleural effusion, no PE, nondisplaced left 6th rib fracture -d/c home with his usual 2L -TOC helped arrange for education for pt's home concentrator   Chronic combined systolic and diastolic CHF -123456 Echo--EF 40-45%, no WMA, mod RV dysfx, PASP 56.5  -03/01/22 Echo--EF 40-45%, global HK, RV overload  -08/18/22 Echo EF 50%, no WMA, normal RVF, PASP 67.1 -Fluid removal via dialysis   Acute Blood Loss Anemia -06/23/22 Hgb 11.7 -presented with Hgb 8.6>>Hgb remains stable -FOBT negative -due to fall and ecchymosis/hematoma on left chest/flank   ESRD -He dialyzes on Monday, Wednesday, Friday -Consulted nephrology for maintenance dialysis -He was started on dialysis sometime in August 2023 -Nephrology consulted for maintenance dialysis -last dialyzed on 08/16/32 -HD 3/9>>3.3L removed   Right pleural effusion -This has been chronic -He has had previous thoracocentesis in the past, last done 03/01/2022 -request thora 3/7>>1.2L removed -pleural fluid transudative   S/P CABG (coronary artery bypass graft) Recent cardiac cath 01/2021, showed patent grafts, severe three-vessel native CAD.Marland Kitchen  History of CABG 2020.   Essential hypertension -Restarted metoprolol succinate -Holding hydralazine and doxazosin for soft blood pressures initially   Esophageal adenocarcinoma (HCC) Follows GI at [facility], Dr. [name] --considered poor candidate for esophagectomy. After multidisciplinary discussion the plan was to continue with endoscopic treatments, and continue to follow-up with oncology for surveillance and any signs of progression of disease -status post EMR, cryotherapy, and most recently RFA in April 2023 -10/01/21 PET scan--no local recurrence -08/04/22 EGD--Dr. Jerilynn Mages C reflux esophagitis, medium hiatus hernia;   PATHOLOGY>>Cardio-oxyntic mucosa with moderate chronic active gastritis and proton pump inhibitor (PPI) effect.  NO dysplasia or metaplasia on esophageal biopsy -Dr. Evangeline Gula increased omeprazole to TID   Uncontrolled diabetes mellitus type 2 with hyperglycemia -01/31/22 A1C--7.7 -novolog sliding scale -08/18/22 A1C--8.1        Consultants: renal, cardiology Procedures performed: none  Disposition: Home Diet recommendation:  Carb modified diet DISCHARGE MEDICATION: Allergies as of 08/21/2022       Reactions   Bee Venom Anaphylaxis   Atorvastatin Other (See Comments)   myalgia   Diltiazem Itching   Lopressor [metoprolol]    NIGHTMARES    Reglan [metoclopramide] Other (See Comments)   Suicidal    Rosuvastatin Other (See Comments)   myalgias   Valsartan Itching        Medication List     STOP taking these medications    ascorbic acid 500 MG tablet Commonly known as: VITAMIN C       TAKE these medications    acetaminophen 325 MG tablet Commonly known as: TYLENOL Take 2 tablets (650 mg total) by mouth every 6 (six) hours as needed for mild pain, moderate pain or fever.   albuterol 108 (90 Base) MCG/ACT inhaler Commonly known as: VENTOLIN HFA INHALE TWO PUFFS BY MOUTH EVERY 6 HOURS AS NEEDED   amiodarone 200 MG tablet Commonly known as: PACERONE Take 1 tablet (200 mg total) by mouth 2 (two) times daily. Through 09/08/22.  Then 1 tab (200 mg) once daily starting 09/09/22 What changed:  when to take this additional instructions   aspirin EC 325  MG tablet Take 1 tablet (325 mg total) by mouth daily with breakfast.   calcium acetate 667 MG capsule Commonly known as: PHOSLO Take 667 mg by mouth in the morning, at noon, and at bedtime.   doxazosin 2 MG tablet Commonly known as: CARDURA TAKE 1 TABLET BY MOUTH DAILY   EPINEPHrine 0.3 mg/0.3 mL Soaj injection Commonly known as: EPI-PEN Inject 0.3 mg into the muscle as needed for anaphylaxis.   hydrALAZINE 50  MG tablet Commonly known as: APRESOLINE Take 1 tablet (50 mg total) by mouth 3 (three) times daily.   Lantus 100 UNIT/ML injection Generic drug: insulin glargine 8 mLs daily. Sliding scale   metoprolol succinate 50 MG 24 hr tablet Commonly known as: TOPROL-XL Take 1 tablet (50 mg total) by mouth daily.   nitroGLYCERIN 0.4 MG SL tablet Commonly known as: NITROSTAT Place 1 tablet (0.4 mg total) under the tongue every 5 (five) minutes as needed for chest pain.   NovoLOG FlexPen 100 UNIT/ML FlexPen Generic drug: insulin aspart Inject 4-10 Units into the skin 3 (three) times daily with meals.   ondansetron 4 MG disintegrating tablet Commonly known as: ZOFRAN-ODT Take 1 tablet by mouth every 8 hours as needed for nausea and vomiting.   pantoprazole 40 MG tablet Commonly known as: PROTONIX Take 1 tablet (40 mg total) by mouth 2 (two) times daily.   Repatha SureClick [id number] MG/ML Soaj Generic drug: Evolocumab Inject 140 mg into the skin every 14 (fourteen) days.   sertraline 100 MG tablet Commonly known as: ZOLOFT TAKE 1 TABLET BY MOUTH DAILY        Follow-up Information     [facility]   Specialty: Home Health Services Why: Will contact you to schedule home health visits. Contact information: [address] [place] [address] [phone]                Discharge Exam: Danley Danker Weights   08/20/22 0117 08/20/22 1145 08/20/22 1700  Weight: 89.9 kg 89.9 kg 86.5 kg  HEENT:  [place]/AT, No thrush, no icterus CV:  RRR, no rub, no S3, no S4 Lung:  bibasilar crackles. No wheeze Abd:  soft/+BS, NT Ext:  No edema, no lymphangitis, no synovitis, no rash   Condition at discharge: stable  The results of significant diagnostics from this hospitalization (including imaging, microbiology, ancillary and laboratory) are listed below for reference.   Imaging Studies: US THORACENTESIS ASP PLEURAL SPACE W/IMG GUIDE  Result Date:  08/18/2022 INDICATION: Patient with a history of heart failure and gastric/esophageal cancer with right pleural effusion. Interventional Radiology asked to perform a diagnostic and therapeutic thoracentesis. EXAM: ULTRASOUND GUIDED THORACENTESIS MEDICATIONS: None. COMPLICATIONS: None immediate. PROCEDURE: An ultrasound guided thoracentesis was thoroughly discussed with the patient and questions answered. The benefits, risks, alternatives and complications were also discussed. The patient understands and wishes to proceed with the procedure. Written consent was obtained. Ultrasound was performed to localize and mark an adequate pocket of fluid in the right chest. The area was then prepped and draped in the normal sterile fashion. 1% Lidocaine was used for local anesthesia. Under ultrasound guidance a 6 Fr Safe-T-Centesis catheter was introduced. Thoracentesis was performed. The catheter was removed and a dressing applied. FINDINGS: A total of approximately 1.2 L of clear yellow fluid was removed. Samples were sent to the laboratory as requested by the clinical team. IMPRESSION: Successful ultrasound guided right thoracentesis yielding 1.2 L of pleural fluid. Read by: Soyla Dryer, NP Electronically Signed  By: Titus Dubin M.D.   On: 08/18/2022 16:35   DG Chest Port 1 View  Result Date: 08/18/2022 CLINICAL DATA:  Status post right thoracentesis EXAM: PORTABLE CHEST 1 VIEW COMPARISON:  CT chest 08/18/2022 and chest radiograph 08/18/2022 FINDINGS: Previous right pleural effusion no longer readily apparent. No visible pneumothorax. Moderate enlargement of the cardiopericardial silhouette. Prior CABG. Indistinct pulmonary vasculature may reflect pulmonary venous hypertension but there is no overt edema. IMPRESSION: 1. Previous right pleural effusion no longer readily apparent. No pneumothorax. 2. Moderate enlargement of the cardiopericardial silhouette with potential pulmonary venous hypertension but no overt  edema. 3. Prior CABG. Electronically Signed   By: Van Clines M.D.   On: 08/18/2022 15:34   ECHOCARDIOGRAM COMPLETE  Result Date: 08/18/2022    ECHOCARDIOGRAM REPORT   Patient Name:   [name] Date of Exam: 08/18/2022 Medical Rec #:  [id number]        Height:       70.0 in Accession #:    [id number]       Weight:       199.0 lb Date of Birth:  Jul 29, 1955        BSA:          2.083 m Patient Age:    65 years         BP:           154/73 mmHg Patient Gender: M                HR:           82 bpm. Exam Location:  [facility] Procedure: 2D Echo, Cardiac Doppler and Color Doppler STAT ECHO Indications:    Chest Pain R07.9  History:        Patient has prior history of Echocardiogram examinations, most                 recent 02/19/2022. CHF and Cardiomyopathy, CAD, Prior CABG,                 Stroke, Arrythmias:Tachycardia, Signs/Symptoms:Shortness of                 Breath and Chest Pain; Risk Factors:Hypertension, Diabetes and                 Dyslipidemia.  Sonographer:    Alvino Chapel RCS Referring Phys: [id number] [place]  1. Left ventricular ejection fraction, by estimation, is 50%. The left ventricle has low normal function. The left ventricle has no regional wall motion abnormalities. There is moderate left ventricular hypertrophy. Left ventricular diastolic parameters  are consistent with Grade II diastolic dysfunction (pseudonormalization). Elevated left atrial pressure.  2. Right ventricular systolic function is normal. The right ventricular size is mildly enlarged. There is severely elevated pulmonary artery systolic pressure.  3. Left atrial size was severely dilated.  4. Right atrial size was severely dilated.  5. The mitral valve is abnormal. Trivial mitral valve regurgitation.  6. The tricuspid valve is abnormal.  7. The aortic valve has an indeterminant number of cusps. Aortic valve regurgitation is not visualized. No aortic stenosis is present.  8. The inferior vena cava is  dilated in size with <50% respiratory variability, suggesting right atrial pressure of 15 mmHg. FINDINGS  Left Ventricle: Left ventricular ejection fraction, by estimation, is 50%. The left ventricle has low normal function. The left ventricle has no regional wall motion abnormalities. Definity contrast agent was given IV to delineate the left  ventricular endocardial borders. The left ventricular internal cavity size was normal in size. There is moderate left ventricular hypertrophy. Left ventricular diastolic parameters are consistent with Grade II diastolic dysfunction (pseudonormalization). Elevated left atrial pressure. Right Ventricle: The right ventricular size is mildly enlarged. Right vetricular wall thickness was not well visualized. Right ventricular systolic function is normal. There is severely elevated pulmonary artery systolic pressure. The tricuspid regurgitant velocity is 3.61 m/s, and with an assumed right atrial pressure of 15 mmHg, the estimated right ventricular systolic pressure is [id number]. Left Atrium: Left atrial size was severely dilated. Right Atrium: Right atrial size was severely dilated. Pericardium: There is no evidence of pericardial effusion. Mitral Valve: The mitral valve is abnormal. There is mild thickening of the mitral valve leaflet(s). There is mild calcification of the mitral valve leaflet(s). Mild mitral annular calcification. Trivial mitral valve regurgitation. MV peak gradient, 6.9 mmHg. The mean mitral valve gradient is 2.0 mmHg. Tricuspid Valve: The tricuspid valve is abnormal. Tricuspid valve regurgitation is mild . No evidence of tricuspid stenosis. Aortic Valve: The aortic valve has an indeterminant number of cusps. Aortic valve regurgitation is not visualized. No aortic stenosis is present. Aortic valve mean gradient measures 4.7 mmHg. Aortic valve peak gradient measures 10.8 mmHg. Aortic valve area, by VTI measures 2.29 cm. Pulmonic Valve: The pulmonic valve was not  well visualized. Pulmonic valve regurgitation is not visualized. No evidence of pulmonic stenosis. Aorta: The aortic root is normal in size and structure. Venous: The inferior vena cava is dilated in size with less than 50% respiratory variability, suggesting right atrial pressure of 15 mmHg. IAS/Shunts: No atrial level shunt detected by color flow Doppler.  LEFT VENTRICLE PLAX 2D LVIDd:         5.80 cm   Diastology LVIDs:         4.30 cm   LV e' medial:    5.11 cm/s LV PW:         1.30 cm   LV E/e' medial:  26.8 LV IVS:        1.30 cm   LV e' lateral:   12.10 cm/s LVOT diam:     2.00 cm   LV E/e' lateral: 11.3 LV SV:         73 LV SV Index:   35 LVOT Area:     3.14 cm  RIGHT VENTRICLE RV S prime:     8.81 cm/s TAPSE (M-mode): 1.8 cm LEFT ATRIUM              Index        RIGHT ATRIUM           Index LA diam:        5.40 cm  2.59 cm/m   RA Area:     31.70 cm LA Vol (A2C):   141.0 ml 67.69 ml/m  RA Volume:   125.00 ml 60.01 ml/m LA Vol (A4C):   145.0 ml 69.62 ml/m LA Biplane Vol: 148.0 ml 71.06 ml/m  AORTIC VALVE AV Area (Vmax):    1.97 cm AV Area (Vmean):   2.22 cm AV Area (VTI):     2.29 cm AV Vmax:           164.56 cm/s AV Vmean:          97.590 cm/s AV VTI:            0.316 m AV Peak Grad:      10.8 mmHg AV Mean Grad:  4.7 mmHg LVOT Vmax:         103.00 cm/s LVOT Vmean:        69.000 cm/s LVOT VTI:          0.231 m LVOT/AV VTI ratio: 0.73  AORTA Ao Root diam: 3.50 cm MITRAL VALVE                TRICUSPID VALVE MV Area (PHT): 4.68 cm     TR Peak grad:   52.1 mmHg MV Area VTI:   2.15 cm     TR Vmax:        361.00 cm/s MV Peak grad:  6.9 mmHg MV Mean grad:  2.0 mmHg     SHUNTS MV Vmax:       1.31 m/s     Systemic VTI:  0.23 m MV Vmean:      64.8 cm/s    Systemic Diam: 2.00 cm MV Decel Time: 162 msec MV E velocity: 137.00 cm/s MV A velocity: 84.80 cm/s MV E/A ratio:  1.62 Carlyle Dolly MD Electronically signed by Carlyle Dolly MD Signature Date/Time: 08/18/2022/12:22:42 PM    Final    CT ABDOMEN  PELVIS W CONTRAST  Result Date: 08/18/2022 CLINICAL DATA:  Chest pain. Nausea, vomiting, diarrhea. History of gastric and esophageal carcinoma. EXAM: CT ANGIOGRAPHY CHEST CT ABDOMEN AND PELVIS WITH CONTRAST TECHNIQUE: Multidetector CT imaging of the chest was performed using the standard protocol during bolus administration of intravenous contrast. Multiplanar CT image reconstructions and MIPs were obtained to evaluate the vascular anatomy. Multidetector CT imaging of the abdomen and pelvis was performed using the standard protocol during bolus administration of intravenous contrast. RADIATION DOSE REDUCTION: This exam was performed according to the departmental dose-optimization program which includes automated exposure control, adjustment of the mA and/or kV according to patient size and/or use of iterative reconstruction technique. CONTRAST:  145m OMNIPAQUE IOHEXOL 350 MG/ML SOLN COMPARISON:  May 19, 2022. FINDINGS: CTA CHEST FINDINGS Cardiovascular: Satisfactory opacification of the pulmonary arteries to the segmental level. No evidence of pulmonary embolism. Normal heart size. No pericardial effusion. Status post coronary artery bypass graft. Mediastinum/Nodes: No enlarged mediastinal, hilar, or axillary lymph nodes. Thyroid gland, trachea, and esophagus demonstrate no significant findings. Lungs/Pleura: No pneumothorax is noted. Moderate right pleural effusion is noted with minimal adjacent subsegmental atelectasis of the right lower lobe. Left lung is unremarkable. Musculoskeletal: Nondisplaced fracture is seen involving the posterior portion of the left sixth rib. There appears to be a moderate fluid collection overlying the lateral and posterior portions of the left chest wall as well. Review of the MIP images confirms the above findings. CT ABDOMEN and PELVIS FINDINGS Hepatobiliary: No focal liver abnormality is seen. No gallstones, gallbladder wall thickening, or biliary dilatation. Pancreas:  Unremarkable. No pancreatic ductal dilatation or surrounding inflammatory changes. Spleen: Normal in size without focal abnormality. Adrenals/Urinary Tract: Adrenal glands appear normal. Bilateral renal cysts are noted for which no further follow-up is required. Mild bilateral renal atrophy is noted. No hydronephrosis or renal obstruction is noted. Urinary bladder is unremarkable. Stomach/Bowel: Stomach is within normal limits. Appendix appears normal. No evidence of bowel wall thickening, distention, or inflammatory changes. Diverticulosis of descending and sigmoid colon is noted without inflammation. Vascular/Lymphatic: Aortic atherosclerosis. No enlarged abdominal or pelvic lymph nodes. Reproductive: Stable mild prostatic enlargement is noted. Other: Small fat containing periumbilical hernia. No ascites is noted. Musculoskeletal: No acute or significant osseous findings. Review of the MIP images confirms the above findings. IMPRESSION: No definite evidence of  pulmonary embolus. Status post coronary artery bypass graft. Moderate size right pleural effusion is noted with minimal adjacent subsegmental atelectasis of the right lower lobe. Nondisplaced left sixth rib fracture. Moderate sized fluid collection is seen overlying the lateral and posterior aspects of the left chest wall. Diverticulosis of descending and sigmoid colon without inflammation. Mild bilateral renal atrophy. Stable mild prostatic enlargement. Aortic Atherosclerosis (ICD10-I70.0). Electronically Signed   By: Marijo Conception M.D.   On: 08/18/2022 12:09   CT Angio Chest PE W and/or Wo Contrast  Result Date: 08/18/2022 CLINICAL DATA:  Chest pain. Nausea, vomiting, diarrhea. History of gastric and esophageal carcinoma. EXAM: CT ANGIOGRAPHY CHEST CT ABDOMEN AND PELVIS WITH CONTRAST TECHNIQUE: Multidetector CT imaging of the chest was performed using the standard protocol during bolus administration of intravenous contrast. Multiplanar CT image  reconstructions and MIPs were obtained to evaluate the vascular anatomy. Multidetector CT imaging of the abdomen and pelvis was performed using the standard protocol during bolus administration of intravenous contrast. RADIATION DOSE REDUCTION: This exam was performed according to the departmental dose-optimization program which includes automated exposure control, adjustment of the mA and/or kV according to patient size and/or use of iterative reconstruction technique. CONTRAST:  150m OMNIPAQUE IOHEXOL 350 MG/ML SOLN COMPARISON:  May 19, 2022. FINDINGS: CTA CHEST FINDINGS Cardiovascular: Satisfactory opacification of the pulmonary arteries to the segmental level. No evidence of pulmonary embolism. Normal heart size. No pericardial effusion. Status post coronary artery bypass graft. Mediastinum/Nodes: No enlarged mediastinal, hilar, or axillary lymph nodes. Thyroid gland, trachea, and esophagus demonstrate no significant findings. Lungs/Pleura: No pneumothorax is noted. Moderate right pleural effusion is noted with minimal adjacent subsegmental atelectasis of the right lower lobe. Left lung is unremarkable. Musculoskeletal: Nondisplaced fracture is seen involving the posterior portion of the left sixth rib. There appears to be a moderate fluid collection overlying the lateral and posterior portions of the left chest wall as well. Review of the MIP images confirms the above findings. CT ABDOMEN and PELVIS FINDINGS Hepatobiliary: No focal liver abnormality is seen. No gallstones, gallbladder wall thickening, or biliary dilatation. Pancreas: Unremarkable. No pancreatic ductal dilatation or surrounding inflammatory changes. Spleen: Normal in size without focal abnormality. Adrenals/Urinary Tract: Adrenal glands appear normal. Bilateral renal cysts are noted for which no further follow-up is required. Mild bilateral renal atrophy is noted. No hydronephrosis or renal obstruction is noted. Urinary bladder is  unremarkable. Stomach/Bowel: Stomach is within normal limits. Appendix appears normal. No evidence of bowel wall thickening, distention, or inflammatory changes. Diverticulosis of descending and sigmoid colon is noted without inflammation. Vascular/Lymphatic: Aortic atherosclerosis. No enlarged abdominal or pelvic lymph nodes. Reproductive: Stable mild prostatic enlargement is noted. Other: Small fat containing periumbilical hernia. No ascites is noted. Musculoskeletal: No acute or significant osseous findings. Review of the MIP images confirms the above findings. IMPRESSION: No definite evidence of pulmonary embolus. Status post coronary artery bypass graft. Moderate size right pleural effusion is noted with minimal adjacent subsegmental atelectasis of the right lower lobe. Nondisplaced left sixth rib fracture. Moderate sized fluid collection is seen overlying the lateral and posterior aspects of the left chest wall. Diverticulosis of descending and sigmoid colon without inflammation. Mild bilateral renal atrophy. Stable mild prostatic enlargement. Aortic Atherosclerosis (ICD10-I70.0). Electronically Signed   By: JMarijo ConceptionM.D.   On: 08/18/2022 12:09   DG Chest Portable 1 View  Result Date: 08/18/2022 CLINICAL DATA:  Chest pain, shortness of breath EXAM: PORTABLE CHEST 1 VIEW COMPARISON:  Previous studies including the examination  of 06/23/2022 FINDINGS: Transverse diameter of heart is increased. There is previous coronary bypass surgery. There is interval removal of right IJ dialysis catheter. Central pulmonary vessels are prominent. There is slight increase in interstitial markings. There is a small to moderate right pleural effusion with interval decrease. Left lateral CP angle is clear. There is no pneumothorax. IMPRESSION: Cardiomegaly. Central pulmonary vessels are prominent. There is prominence of interstitial markings suggesting possible mild interstitial edema. There are no signs of alveolar  pulmonary edema. Increased density in right lower lung fields may suggest pleural effusion and underlying atelectasis. Electronically Signed   By: Elmer Picker M.D.   On: 08/18/2022 08:52    Microbiology: Results for orders placed or performed during the hospital encounter of 08/18/22  MRSA Next Gen by PCR, Nasal     Status: None   Collection Time: 08/18/22  2:00 PM   Specimen: Nasal Mucosa; Nasal Swab  Result Value Ref Range Status   MRSA by PCR Next Gen NOT DETECTED NOT DETECTED Final    Comment: (NOTE) The GeneXpert MRSA Assay (FDA approved for NASAL specimens only), is one component of a comprehensive MRSA colonization surveillance program. It is not intended to diagnose MRSA infection nor to guide or monitor treatment for MRSA infections. Test performance is not FDA approved in patients less than 56 years old. Performed at [facility], [address], [place], [place] [address]   Culture, body fluid w Gram Stain-bottle     Status: None (Preliminary result)   Collection Time: 08/18/22  3:00 PM   Specimen: Pleura  Result Value Ref Range Status   Specimen Description PLEURAL  Final   Special Requests BAA 10CC  Final   Culture   Final    NO GROWTH 3 DAYS Performed at [facility], [address], [place], [place] [address]    Report Status PENDING  Incomplete  Gram stain     Status: None   Collection Time: 08/18/22  3:00 PM   Specimen: Pleura  Result Value Ref Range Status   Specimen Description PLEURAL  Final   Special Requests NONE  Final   Gram Stain   Final    WBC PRESENT, PREDOMINANTLY MONONUCLEAR NO ORGANISMS SEEN CYTOSPIN SMEAR Performed at [facility], [address] [place], [place] [address]    Report Status 08/18/2022 FINAL  Final    Labs: CBC: Recent Labs  Lab 08/18/22 0849 08/19/22 0416 08/20/22 0543  WBC 10.5 7.9 9.1  HGB 8.6* 8.6* 9.0*  HCT 28.0* 28.5* 29.3*  MCV 90.0 90.8 91.0  PLT 316 278 [id number]   Basic Metabolic Panel: Recent Labs  Lab  08/18/22 0849 08/19/22 0500 08/20/22 0543  NA 133* 132* 132*  K 3.6 4.0 4.3  CL 92* 92* 94*  CO2 '29 26 26  '$ GLUCOSE 321* 281* 172*  BUN 25* 28* 38*  CREATININE 3.53* 4.23* 5.46*  CALCIUM 10.1 9.4 8.9  MG 3.1*  --   --   PHOS  --  2.9 3.2   Liver Function Tests: Recent Labs  Lab 08/18/22 0849 08/19/22 0416 08/19/22 0500 08/20/22 0543  AST 12* 9*  --   --   ALT 8 7  --   --   ALKPHOS 113 89  --   --   BILITOT 1.1 1.2  --   --   PROT 6.3* 5.8*  --   --   ALBUMIN 3.2* 2.9* 2.9* 2.9*   CBG: Recent Labs  Lab 08/20/22 0117 08/20/22 0750 08/20/22 1728  08/20/22 2155 08/21/22 0801  GLUCAP 165* 188* 132* 203* 164*    Discharge time spent: greater than 30 minutes.  Signed: Orson Eva, MD [facility] 08/21/2022

## 2022-08-21 NOTE — Progress Notes (Signed)
Patient has slept most of the night. No complaints of pain or discomfort. 1 prn medication given. Patient tolerated carb modified diet well. Continues to be monitored by the centralized telemetry unit with heart rhythm of NSR with 1st degree heart block. Plan of care ongoing.

## 2022-08-21 NOTE — TOC Transition Note (Signed)
Transition of Care [facility]) - CM/SW Discharge Note   Patient Details  Name: [name] MRN: [id number] Date of Birth: 08-09-1955  [facility] CM/SW Contact:  Salome Arnt, LCSW Phone Number: 08/21/2022, 10:35 AM   Clinical Narrative:  Pt d/c today. Pt states he does not know how to use O2 concentrator at home and portable tanks are empty. LCSW discussed with Jasmine at [facility] who will have portable tanks delivered to hospital today and will schedule pt for education at home tomorrow on how to use concentrator. Delana Meyer is aware pt will not be home until after 3:00 tomorrow due to dialysis. [name] and RN updated. [name] to transport pt home this afternoon. Marjory Lies with [facility] notified of d/c. Home health orders in.     Final next level of care: [place] Barriers to Discharge: Barriers Resolved   Patient Goals and CMS Choice   Choice offered to / list presented to : Patient  Discharge Placement                    Name of family member notified: [name] Patient and family notified of of transfer: 08/21/22  Discharge Plan and Services Additional resources added to the After Visit Summary for   In-house Referral: Clinical Social Work                        HH Arranged: PT, OT [place] Agency: [place] Date [place]: 08/21/22 Time [place]: 1034 Representative spoke with at [place]: [place] Determinants of Health ([place]) Interventions [name] Screenings   Food Insecurity: No Food Insecurity (08/18/2022)  Housing: Low Risk  (08/18/2022)  Transportation Needs: Unmet Transportation Needs (08/18/2022)  Utilities: Not At Risk (08/18/2022)  Alcohol Screen: Low Risk  (04/21/2022)  Depression (PHQ2-9): Medium Risk (08/11/2022)  Financial Resource Strain: Low Risk  (04/21/2022)  Physical Activity: Inactive (04/21/2022)  Social Connections: Unknown (04/21/2022)  Stress: No Stress Concern Present (04/21/2022)   Tobacco Use: Medium Risk (08/18/2022)     Readmission Risk Interventions    08/19/2022    8:11 AM 05/04/2022    9:06 AM 03/01/2022    8:14 AM  Readmission Risk Prevention Plan  Transportation Screening Complete Complete Complete  Medication Review Press photographer) Complete Complete Complete  HRI or [place] Complete Complete Complete  SW Recovery Care/Counseling Consult Complete Complete Complete  Palliative Care Screening Not Applicable Not Applicable Not [place] Not Applicable Not Applicable Not Applicable

## 2022-08-22 ENCOUNTER — Telehealth: Payer: Self-pay | Admitting: *Deleted

## 2022-08-22 DIAGNOSIS — N186 End stage renal disease: Secondary | ICD-10-CM | POA: Diagnosis not present

## 2022-08-22 DIAGNOSIS — N2581 Secondary hyperparathyroidism of renal origin: Secondary | ICD-10-CM | POA: Diagnosis not present

## 2022-08-22 DIAGNOSIS — Z992 Dependence on renal dialysis: Secondary | ICD-10-CM | POA: Diagnosis not present

## 2022-08-22 NOTE — Transitions of Care (Post Inpatient/ED Visit) (Signed)
   08/22/2022  Name: [name] MRN: [id number] DOB: [birth year]  Today's TOC FU Call Status: Today's TOC FU Call Status:: Unsuccessul Call (1st Attempt) Unsuccessful Call (1st Attempt) Date: 08/22/22  Attempted to reach the patient regarding the most recent Inpatient/ED visit.  Follow Up Plan: Additional outreach attempts will be made to reach the patient to complete the Transitions of Care (Post Inpatient/ED visit) call. Left HIPAA compliant VM with return telephone number and that additional outreaches will be attempted. Reminded of telephone visit with LCSW for tomorrow.  Chong Sicilian, BSN, RN-BC RN Care Coordinator [place] Direct Dial: [phone] Main #: [phone]

## 2022-08-23 ENCOUNTER — Telehealth: Payer: Self-pay | Admitting: *Deleted

## 2022-08-23 ENCOUNTER — Encounter: Payer: Self-pay | Admitting: *Deleted

## 2022-08-23 ENCOUNTER — Ambulatory Visit: Payer: Self-pay | Admitting: *Deleted

## 2022-08-23 DIAGNOSIS — I132 Hypertensive heart and chronic kidney disease with heart failure and with stage 5 chronic kidney disease, or end stage renal disease: Secondary | ICD-10-CM | POA: Diagnosis not present

## 2022-08-23 DIAGNOSIS — T24231D Burn of second degree of right lower leg, subsequent encounter: Secondary | ICD-10-CM | POA: Diagnosis not present

## 2022-08-23 DIAGNOSIS — I25118 Atherosclerotic heart disease of native coronary artery with other forms of angina pectoris: Secondary | ICD-10-CM | POA: Diagnosis not present

## 2022-08-23 DIAGNOSIS — I5043 Acute on chronic combined systolic (congestive) and diastolic (congestive) heart failure: Secondary | ICD-10-CM | POA: Diagnosis not present

## 2022-08-23 DIAGNOSIS — D631 Anemia in chronic kidney disease: Secondary | ICD-10-CM | POA: Diagnosis not present

## 2022-08-23 DIAGNOSIS — E1122 Type 2 diabetes mellitus with diabetic chronic kidney disease: Secondary | ICD-10-CM | POA: Diagnosis not present

## 2022-08-23 DIAGNOSIS — E1165 Type 2 diabetes mellitus with hyperglycemia: Secondary | ICD-10-CM | POA: Diagnosis not present

## 2022-08-23 DIAGNOSIS — N186 End stage renal disease: Secondary | ICD-10-CM | POA: Diagnosis not present

## 2022-08-23 DIAGNOSIS — K219 Gastro-esophageal reflux disease without esophagitis: Secondary | ICD-10-CM | POA: Diagnosis not present

## 2022-08-23 LAB — CYTOLOGY - NON PAP

## 2022-08-23 NOTE — Transitions of Care (Post Inpatient/ED Visit) (Signed)
   08/23/2022  Name: [name] MRN: [id number] DOB: [birth year]  Today's TOC FU Call Status: Today's TOC FU Call Status:: Successful TOC FU Call Competed TOC FU Call Complete Date: 08/23/22  Transition Care Management Follow-up Telephone Call Date of Discharge: 08/21/22 [facility] Type of Discharge: Inpatient Admission Primary Inpatient Discharge Diagnosis:: Chest pain, unspecified type How have you been since you were released from the hospital?: Better Any questions or concerns?: No  Items Reviewed: Did you receive and understand the discharge instructions provided?: Yes Medications obtained and verified?: Yes (Medications Reviewed) Any new allergies since your discharge?: No Dietary orders reviewed?: Yes Type of Diet Ordered:: carb modified Do you have support at home?: Yes People in Home: alone Name of Support/Comfort Primary Source: [name] (caregiver)  [place] and Equipment/Supplies: [place] Ordered?: No Any new equipment or medical supplies ordered?: No  Functional Questionnaire: Do you need assistance with bathing/showering or dressing?: Yes Do you need assistance with meal preparation?: Yes Do you need assistance with eating?: Yes Do you have difficulty maintaining continence: No Do you need assistance with getting out of bed/getting out of a chair/moving?: Yes Do you have difficulty managing or taking your medications?: No  Folllow up appointments reviewed: PCP Follow-up appointment confirmed?: Yes Date of PCP follow-up appointment?: 09/01/22 Follow-up Provider: Dr Wolfgang [facility] Follow-up appointment confirmed?: NA Do you need transportation to your follow-up appointment?: Yes Transportation Need Intervention Addressed By:: Other: (Patient uses [facility] and family/friends for transportation. Will arrange on his own.) Do you understand care options if your condition(s) worsen?: Yes-patient verbalized  understanding  [name] Interventions Today    Flowsheet Row Most Recent Value  [name] Interventions   Transportation Interventions Other (Comment), Intervention Not Indicated  [using [facility]]      TOC Interventions Today    Flowsheet Row Most Recent Value  TOC Interventions   TOC Interventions Discussed/Reviewed TOC Interventions Discussed, TOC Interventions Reviewed, Arranged PCP follow up within 7 days/Care Guide scheduled      Chong Sicilian, BSN, RN-BC RN Care Coordinator [place]: [phone] Main #: [phone]

## 2022-08-23 NOTE — Patient Outreach (Signed)
Care Coordination   Follow Up Visit Note   08/23/2022  Name: [name] MRN: [id number] DOB: January 11, 1956  [name] MUSGRAVES is a 67 y.o. year old male who sees [name], [name], MD for primary care. I spoke with [name] by phone today.  What matters to the patients health and wellness today?   Receive Assistance Applying for [place].   Goals Addressed               This Visit's Progress     Receive Assistance Applying for [place]. (pt-stated)   On track     Care Coordination Interventions:  Interventions Today    Flowsheet Row Most Recent Value  Chronic Disease   Chronic disease during today's visit Diabetes, Hypertension (HTN), Congestive Heart Failure (CHF), Chronic Kidney Disease/End Stage Renal Disease (ESRD), Other  [Major Depression, Single Episde, Severe]  General Interventions   General Interventions Discussed/Reviewed General Interventions Discussed, General Interventions Reviewed, Annual Eye Exam, Labs, Annual Foot Exam, Lipid Profile, Durable Medical Equipment (DME), Vaccines, Health Screening, [employer], Doctor Visits, Communication with, Level of Care  [facility] Care Provider & Representative with [facility]]  Labs Hgb A1c every 3 months, Kidney Function  Vaccines COVID-19, Flu, Pneumonia, RSV, Shingles  [Encouraged]  Doctor Visits Discussed/Reviewed Doctor Visits Discussed, Doctor Visits Reviewed, Annual Wellness Visits, PCP, Specialist  Health Screening Colonoscopy, Prostate  Durable Medical Equipment (DME) BP Cuff, Glucomoter, Insulin Pump, Oxygen, Walker, Community education officer  PCP/Specialist Visits Compliance with follow-up visit  Communication with PCP/Specialists  Level of Care Applications, Assisted Living, [place]  Exercise Interventions   Exercise Discussed/Reviewed Exercise Discussed, Exercise Reviewed,  Physical Activity, Weight Managment, Assistive device use and maintanence  Physical Activity Discussed/Reviewed Physical Activity Discussed, Physical Activity Reviewed, Types of exercise  [Encouraged]  Weight Management Weight maintenance  Education Interventions   Education Provided Provided Engineer, site, Provided Education  Provided Verbal Education On Nutrition, Foot Care, Eye Care, Labs, Blood Sugar Monitoring, Mental Health/Coping with Illness, Applications, Exercise, Medication, When to see the doctor, [place] Discussed, Mental Health Reviewed, Coping Strategies, Crisis, Anxiety, Depression, Suicide  Nutrition Interventions   Nutrition Discussed/Reviewed Nutrition Discussed, Nutrition Reviewed, Adding fruits and vegetables, Fluid intake, Portion sizes, Decreasing sugar intake, Decreasing salt, Decreasing fats, Increaing proteins  Pharmacy Interventions   Pharmacy Dicussed/Reviewed Pharmacy Topics Discussed, Pharmacy Topics Reviewed, Medication Adherence, Affording Medications  Safety Interventions   Safety Discussed/Reviewed Safety Discussed, Safety Reviewed, Fall Risk, Home Safety  Home Herbalist, Refer for community resources, Contact home health agency  Advanced Directive Interventions   Advanced Directives Discussed/Reviewed Advanced Directives Discussed, Advanced Directives Reviewed     Active listening & reflection utilized.  Verbalization of feelings encouraged. Emotional support provided. Feelings of frustration regarding loss of independence validated. Caregiver stress & burnout acknowledged. Caregiver resources reviewed. Caregiver support groups provided. Self-enrollment in caregiver support group of interest emphasized. Problem solving interventions indicated. Task-centered strategies  employed. Solution-focused activities activated. Cognitive Behavioral Therapy initiated. Acceptance & Commitment Therapy implemented. Client-Centered Therapy performed. CSW collaboration with Primary Care Provider, Dr. [name] with [facility] [place] [phone]# [phone]), via secure chat message in [place], to request completed & signed [place] application be faxed to [place] (Fax # [phone]). CSW faxed completed & signed [place] application to [facility] (#  [phone]) for processing, on 08/22/2022. CSW collaboration with representative from [facility] [phone]), to confirm receipt of [place] application for processing, on 08/23/2022. Please accept all calls from representative with [place] (# [phone]), in an effort to [place] in the home. Please keep [place] Provider list, until approved for [place], through [facility] [phone]). Please decide on at least 3 agencies of interest, from the list of [place] Providers & be prepared to provide these agencies to representative with [place] [phone]), once approved for [place]. Please continue to participate in home health physical & occupational therapy services, through [facility] [phone]).      [name] assessments and interventions completed:  Yes.  Care Coordination Interventions:  Yes, provided.   Follow up plan: Follow up call scheduled for 08/31/2022 at 9:00 am.  Encounter Outcome:  Pt. Visit Completed.   [name], BSW, MSW, LCSW  Licensed Education officer, [facility]  Mailing [place] [address], [place], [place] [address] Physical Address-[address] [place], [place] [address] Toll Free Main # [phone] Fax #  [phone] Cell # [phone] [email or web address]

## 2022-08-23 NOTE — Patient Instructions (Signed)
Visit Information  Thank you for taking time to visit with me today. Please don't hesitate to contact me if I can be of assistance to you.   Following are the goals we discussed today:   Goals Addressed               This Visit's Progress     Receive Assistance Applying for [place]. (pt-stated)   On track     Care Coordination Interventions:  Interventions Today    Flowsheet Row Most Recent Value  Chronic Disease   Chronic disease during today's visit Diabetes, Hypertension (HTN), Congestive Heart Failure (CHF), Chronic Kidney Disease/End Stage Renal Disease (ESRD), Other  [Major Depression, Single Episde, Severe]  General Interventions   General Interventions Discussed/Reviewed General Interventions Discussed, General Interventions Reviewed, Annual Eye Exam, Labs, Annual Foot Exam, Lipid Profile, Durable Medical Equipment (DME), Vaccines, Health Screening, [employer], Doctor Visits, Communication with, Level of Care  [facility] Care Provider & Representative with [facility]]  Labs Hgb A1c every 3 months, Kidney Function  Vaccines COVID-19, Flu, Pneumonia, RSV, Shingles  [Encouraged]  Doctor Visits Discussed/Reviewed Doctor Visits Discussed, Doctor Visits Reviewed, Annual Wellness Visits, PCP, Specialist  Health Screening Colonoscopy, Prostate  Durable Medical Equipment (DME) BP Cuff, Glucomoter, Insulin Pump, Oxygen, Walker, Community education officer  PCP/Specialist Visits Compliance with follow-up visit  Communication with PCP/Specialists  Level of Care Applications, Assisted Living, [place]  Exercise Interventions   Exercise Discussed/Reviewed Exercise Discussed, Exercise Reviewed, Physical Activity, Weight Managment, Assistive device use and maintanence  Physical Activity Discussed/Reviewed Physical Activity Discussed, Physical Activity Reviewed, Types of exercise   [Encouraged]  Weight Management Weight maintenance  Education Interventions   Education Provided Provided Engineer, site, Provided Education  Provided Verbal Education On Nutrition, Foot Care, Eye Care, Labs, Blood Sugar Monitoring, Mental Health/Coping with Illness, Applications, Exercise, Medication, When to see the doctor, [place] Discussed, Mental Health Reviewed, Coping Strategies, Crisis, Anxiety, Depression, Suicide  Nutrition Interventions   Nutrition Discussed/Reviewed Nutrition Discussed, Nutrition Reviewed, Adding fruits and vegetables, Fluid intake, Portion sizes, Decreasing sugar intake, Decreasing salt, Decreasing fats, Increaing proteins  Pharmacy Interventions   Pharmacy Dicussed/Reviewed Pharmacy Topics Discussed, Pharmacy Topics Reviewed, Medication Adherence, Affording Medications  Safety Interventions   Safety Discussed/Reviewed Safety Discussed, Safety Reviewed, Fall Risk, Home Safety  Home Herbalist, Refer for community resources, Contact home health agency  Advanced Directive Interventions   Advanced Directives Discussed/Reviewed Advanced Directives Discussed, Advanced Directives Reviewed     Active listening & reflection utilized.  Verbalization of feelings encouraged. Emotional support provided. Feelings of frustration regarding loss of independence validated. Caregiver stress & burnout acknowledged. Caregiver resources reviewed. Caregiver support groups provided. Self-enrollment in caregiver support group of interest emphasized. Problem solving interventions indicated. Task-centered strategies employed. Solution-focused activities activated. Cognitive Behavioral Therapy initiated. Acceptance & Commitment Therapy implemented. Client-Centered Therapy performed. CSW collaboration with Primary Care  Provider, Dr. [name] with [facility] [phone]# [phone]), via secure chat message in [place], to request completed & signed [place] application be faxed to [place] (Fax # [phone]). CSW faxed completed & signed [place] application to [facility] [phone]) for processing, on 08/22/2022. CSW collaboration with representative from [facility] [phone]), to confirm receipt of [place] application for processing, on 08/23/2022. Please accept all calls from representative  with [place] (# [phone]), in an effort to establish [place] in the home. Please keep [place] Provider list, until approved for [place], through [facility] [phone]). Please decide on at least 3 agencies of interest, from the list of [place] Providers & be prepared to provide these agencies to representative with [place] [phone]), once approved for [place]. Please continue to participate in home health physical & occupational therapy services, through [facility] [phone]).      Our next appointment is by telephone on 08/31/2022 at 9:00 am.  Please call the care guide team at [phone] if you need to cancel or reschedule your appointment.   If you are experiencing a Mental Health or [place] or need someone to talk to, please call the Suicide and Crisis Lifeline: 988 call the [place] National Suicide Prevention Lifeline: (956)506-2414 or TTY: 785-277-4255 TTY 276 206 1238) to talk to a trained counselor call 1-800-273-TALK (toll free, 24 hour hotline) go to [facility] [address], [place] [phone]) call the [place]: [phone] call 911  Patient verbalizes understanding of instructions and care  plan provided today and agrees to view in [place]. Active MyChart status and patient understanding of how to access instructions and care plan via MyChart confirmed with patient.     Telephone follow up appointment with care management team member scheduled for: 08/31/2022 at 9:00 am.  [name], BSW, MSW, [place]  Licensed Clinical Social Worker  [place]  Mailing [place]. [address], [place], [place] [address] Physical Address-[address], [place], [place] [address] Toll Free Main # [phone] Fax # [phone] Cell # [phone] [email or web address]

## 2022-08-24 DIAGNOSIS — Z992 Dependence on renal dialysis: Secondary | ICD-10-CM | POA: Diagnosis not present

## 2022-08-24 DIAGNOSIS — N2581 Secondary hyperparathyroidism of renal origin: Secondary | ICD-10-CM | POA: Diagnosis not present

## 2022-08-24 DIAGNOSIS — N186 End stage renal disease: Secondary | ICD-10-CM | POA: Diagnosis not present

## 2022-08-24 LAB — CULTURE, BODY FLUID W GRAM STAIN -BOTTLE: Culture: NO GROWTH

## 2022-08-25 DIAGNOSIS — D631 Anemia in chronic kidney disease: Secondary | ICD-10-CM | POA: Diagnosis not present

## 2022-08-25 DIAGNOSIS — I5043 Acute on chronic combined systolic (congestive) and diastolic (congestive) heart failure: Secondary | ICD-10-CM | POA: Diagnosis not present

## 2022-08-25 DIAGNOSIS — I5042 Chronic combined systolic (congestive) and diastolic (congestive) heart failure: Secondary | ICD-10-CM | POA: Diagnosis not present

## 2022-08-25 DIAGNOSIS — K219 Gastro-esophageal reflux disease without esophagitis: Secondary | ICD-10-CM | POA: Diagnosis not present

## 2022-08-25 DIAGNOSIS — I132 Hypertensive heart and chronic kidney disease with heart failure and with stage 5 chronic kidney disease, or end stage renal disease: Secondary | ICD-10-CM | POA: Diagnosis not present

## 2022-08-25 DIAGNOSIS — N186 End stage renal disease: Secondary | ICD-10-CM | POA: Diagnosis not present

## 2022-08-25 DIAGNOSIS — E1165 Type 2 diabetes mellitus with hyperglycemia: Secondary | ICD-10-CM | POA: Diagnosis not present

## 2022-08-25 DIAGNOSIS — Z515 Encounter for palliative care: Secondary | ICD-10-CM | POA: Diagnosis not present

## 2022-08-25 DIAGNOSIS — I25118 Atherosclerotic heart disease of native coronary artery with other forms of angina pectoris: Secondary | ICD-10-CM | POA: Diagnosis not present

## 2022-08-25 DIAGNOSIS — Z992 Dependence on renal dialysis: Secondary | ICD-10-CM | POA: Diagnosis not present

## 2022-08-25 DIAGNOSIS — E1122 Type 2 diabetes mellitus with diabetic chronic kidney disease: Secondary | ICD-10-CM | POA: Diagnosis not present

## 2022-08-25 DIAGNOSIS — T24231D Burn of second degree of right lower leg, subsequent encounter: Secondary | ICD-10-CM | POA: Diagnosis not present

## 2022-08-26 DIAGNOSIS — N186 End stage renal disease: Secondary | ICD-10-CM | POA: Diagnosis not present

## 2022-08-26 DIAGNOSIS — N2581 Secondary hyperparathyroidism of renal origin: Secondary | ICD-10-CM | POA: Diagnosis not present

## 2022-08-26 DIAGNOSIS — Z992 Dependence on renal dialysis: Secondary | ICD-10-CM | POA: Diagnosis not present

## 2022-08-30 DIAGNOSIS — N186 End stage renal disease: Secondary | ICD-10-CM | POA: Diagnosis not present

## 2022-08-30 DIAGNOSIS — Z992 Dependence on renal dialysis: Secondary | ICD-10-CM | POA: Diagnosis not present

## 2022-08-30 DIAGNOSIS — N2581 Secondary hyperparathyroidism of renal origin: Secondary | ICD-10-CM | POA: Diagnosis not present

## 2022-08-31 ENCOUNTER — Ambulatory Visit: Payer: Self-pay | Admitting: *Deleted

## 2022-08-31 ENCOUNTER — Encounter: Payer: Self-pay | Admitting: *Deleted

## 2022-08-31 DIAGNOSIS — N186 End stage renal disease: Secondary | ICD-10-CM | POA: Diagnosis not present

## 2022-08-31 DIAGNOSIS — Z992 Dependence on renal dialysis: Secondary | ICD-10-CM | POA: Diagnosis not present

## 2022-08-31 DIAGNOSIS — N2581 Secondary hyperparathyroidism of renal origin: Secondary | ICD-10-CM | POA: Diagnosis not present

## 2022-08-31 NOTE — Patient Instructions (Signed)
Visit Information  Thank you for taking time to visit with me today. Please don't hesitate to contact me if I can be of assistance to you.   Following are the goals we discussed today:   Goals Addressed               This Visit's Progress     Receive Assistance Applying for [place]. (pt-stated)   On track     Care Coordination Interventions:  Interventions Today    Flowsheet Row Most Recent Value  Chronic Disease   Chronic disease during today's visit Diabetes, Hypertension (HTN), Congestive Heart Failure (CHF), Chronic Kidney Disease/End Stage Renal Disease (ESRD), Other  [Major Depression, Single Episde, Severe]  General Interventions   General Interventions Discussed/Reviewed General Interventions Discussed, General Interventions Reviewed, Annual Eye Exam, Labs, Annual Foot Exam, Lipid Profile, Durable Medical Equipment (DME), Vaccines, Health Screening, [employer], Doctor Visits, Communication with, Level of Care  [facility] Care Provider & Representative with [facility]]  Labs Hgb A1c every 3 months, Kidney Function  Vaccines COVID-19, Flu, Pneumonia, RSV, Shingles  [Encouraged]  Doctor Visits Discussed/Reviewed Doctor Visits Discussed, Doctor Visits Reviewed, Annual Wellness Visits, PCP, Specialist  Health Screening Colonoscopy, Prostate  Durable Medical Equipment (DME) BP Cuff, Glucomoter, Insulin Pump, Oxygen, Walker, Community education officer  PCP/Specialist Visits Compliance with follow-up visit  Communication with PCP/Specialists  Level of Care Applications, Assisted Living, [place]  Exercise Interventions   Exercise Discussed/Reviewed Exercise Discussed, Exercise Reviewed, Physical Activity, Weight Managment, Assistive device use and maintanence  Physical Activity Discussed/Reviewed Physical Activity Discussed, Physical Activity Reviewed, Types of exercise   [Encouraged]  Weight Management Weight maintenance  Education Interventions   Education Provided Provided Engineer, site, Provided Education  Provided Verbal Education On Nutrition, Foot Care, Eye Care, Labs, Blood Sugar Monitoring, Mental Health/Coping with Illness, Applications, Exercise, Medication, When to see the doctor, [place] Discussed, Mental Health Reviewed, Coping Strategies, Crisis, Anxiety, Depression, Suicide  Nutrition Interventions   Nutrition Discussed/Reviewed Nutrition Discussed, Nutrition Reviewed, Adding fruits and vegetables, Fluid intake, Portion sizes, Decreasing sugar intake, Decreasing salt, Decreasing fats, Increaing proteins  Pharmacy Interventions   Pharmacy Dicussed/Reviewed Pharmacy Topics Discussed, Pharmacy Topics Reviewed, Medication Adherence, Affording Medications  Safety Interventions   Safety Discussed/Reviewed Safety Discussed, Safety Reviewed, Fall Risk, Home Safety  Home Herbalist, Refer for community resources, Contact home health agency  Advanced Directive Interventions   Advanced Directives Discussed/Reviewed Advanced Directives Discussed, Advanced Directives Reviewed     Active listening & reflection utilized.  Verbalization of feelings encouraged. Emotional support provided. Feelings of frustration regarding loss of independence validated. Caregiver stress & burnout acknowledged. Caregiver resources reviewed. Caregiver support groups provided. Self-enrollment in caregiver support group of interest emphasized. Problem solving interventions indicated. Task-centered strategies employed. Solution-focused activities activated. Cognitive Behavioral Therapy initiated. Acceptance & Commitment Therapy implemented. Client-Centered Therapy performed. CSW collaboration with representative  from [facility] [phone]), to confirm [place] application still being processed. Please accept all calls from representative with [place] (# [phone]), in an effort to [place] in the home. Please keep [place] Provider list, until approved for [place], through [facility] [phone]). Please decide on at least 3 agencies of interest, from the list of [place] Providers & be prepared to provide  agencies of interest to representative with [place] [phone]), once approved for [place]. Please continue to participate in home health physical & occupational therapy services, through [facility] [phone]). Please continue to receive in-home care services & assistance with activities of daily living from private pay caregiver, [name]. Please continue to utilize (RCATS) [place], through [place] [facility] [place] [phone]), for physician appointments & provider practice visits. Please keep follow-up appointment with Primary Care Provider, Dr. [name] with [place] [phone]), scheduled on 09/01/2022 at 9:40 am. Please contact CSW directly (# [phone]), if you have questions, need assistance, or if additional social work needs are identified between now & our next scheduled follow-up outreach call.      Our next appointment is by telephone on 09/15/2022 at 11:45 am.  Please call the care guide team at [phone] if you need to cancel or reschedule your appointment.   If you are experiencing a Mental Health or [place] or need someone to talk to, please call the Suicide and Crisis Lifeline: 988 call the [place] National Suicide Prevention Lifeline: 8723030783 or TTY:  (571)145-3241 TTY 601-680-7077) to talk to a trained counselor call 1-800-273-TALK (toll free, 24 hour hotline) go to [facility] [address], [place] [phone]) call the [place]: [phone] call 911  Patient verbalizes understanding of instructions and care plan provided today and agrees to view in [place]. Active MyChart status and patient understanding of how to access instructions and care plan via MyChart confirmed with patient.     Telephone follow up appointment with care management team member scheduled for:  09/15/2022 at 11:45 am.  [name], BSW, MSW, [place]  Licensed Clinical Social Worker  [place]  Mailing [place]. [address] [place], [place] [address] Physical Address-[address], [place], [place] [address] Toll Free Main # [phone] Fax # [phone] Cell # [phone] [email or web address]

## 2022-08-31 NOTE — Patient Outreach (Signed)
Care Coordination   Follow Up Visit Note   08/31/2022  Name: [name] MRN: [id number] DOB: 12-26-1955  [name] is a 67 y.o. year old male who sees [name], [name], MD for primary care. I spoke with [name] by phone today.  What matters to the patients health and wellness today?  Receive Assistance Applying for [place].   Goals Addressed               This Visit's Progress     Receive Assistance Applying for [place]. (pt-stated)   On track     Care Coordination Interventions:  Interventions Today    Flowsheet Row Most Recent Value  Chronic Disease   Chronic disease during today's visit Diabetes, Hypertension (HTN), Congestive Heart Failure (CHF), Chronic Kidney Disease/End Stage Renal Disease (ESRD), Other  [Major Depression, Single Episde, Severe]  General Interventions   General Interventions Discussed/Reviewed General Interventions Discussed, General Interventions Reviewed, Annual Eye Exam, Labs, Annual Foot Exam, Lipid Profile, Durable Medical Equipment (DME), Vaccines, Health Screening, [employer], Doctor Visits, Communication with, Level of Care  [facility] Care Provider & Representative with [facility]]  Labs Hgb A1c every 3 months, Kidney Function  Vaccines COVID-19, Flu, Pneumonia, RSV, Shingles  [Encouraged]  Doctor Visits Discussed/Reviewed Doctor Visits Discussed, Doctor Visits Reviewed, Annual Wellness Visits, PCP, Specialist  Health Screening Colonoscopy, Prostate  Durable Medical Equipment (DME) BP Cuff, Glucomoter, Insulin Pump, Oxygen, Walker, Community education officer  PCP/Specialist Visits Compliance with follow-up visit  Communication with PCP/Specialists  Level of Care Applications, Assisted Living, [place]  Exercise Interventions   Exercise Discussed/Reviewed Exercise Discussed, Exercise Reviewed,  Physical Activity, Weight Managment, Assistive device use and maintanence  Physical Activity Discussed/Reviewed Physical Activity Discussed, Physical Activity Reviewed, Types of exercise  [Encouraged]  Weight Management Weight maintenance  Education Interventions   Education Provided Provided Engineer, site, Provided Education  Provided Verbal Education On Nutrition, Foot Care, Eye Care, Labs, Blood Sugar Monitoring, Mental Health/Coping with Illness, Applications, Exercise, Medication, When to see the doctor, [place] Discussed, Mental Health Reviewed, Coping Strategies, Crisis, Anxiety, Depression, Suicide  Nutrition Interventions   Nutrition Discussed/Reviewed Nutrition Discussed, Nutrition Reviewed, Adding fruits and vegetables, Fluid intake, Portion sizes, Decreasing sugar intake, Decreasing salt, Decreasing fats, Increaing proteins  Pharmacy Interventions   Pharmacy Dicussed/Reviewed Pharmacy Topics Discussed, Pharmacy Topics Reviewed, Medication Adherence, Affording Medications  Safety Interventions   Safety Discussed/Reviewed Safety Discussed, Safety Reviewed, Fall Risk, Home Safety  Home Herbalist, Refer for community resources, Contact home health agency  Advanced Directive Interventions   Advanced Directives Discussed/Reviewed Advanced Directives Discussed, Advanced Directives Reviewed     Active listening & reflection utilized.  Verbalization of feelings encouraged. Emotional support provided. Feelings of frustration regarding loss of independence validated. Caregiver stress & burnout acknowledged. Caregiver resources reviewed. Caregiver support groups provided. Self-enrollment in caregiver support group of interest emphasized. Problem solving interventions indicated. Task-centered strategies  employed. Solution-focused activities activated. Cognitive Behavioral Therapy initiated. Acceptance & Commitment Therapy implemented. Client-Centered Therapy performed. CSW collaboration with representative from [facility] [phone]), to confirm [place] application still being processed. Please accept all calls from representative with [place] (# [phone]), in an effort to [place] in the home. Please keep [place] Provider list, until approved for  [place], through [facility] [phone]). Please decide on at least 3 agencies of interest, from the list of [place] Providers & be prepared to provide agencies of interest to representative with [place] [phone]), once approved for [place]. Please continue to participate in home health physical & occupational therapy services, through [facility] [phone]). Please continue to receive in-home care services & assistance with activities of daily living from private pay caregiver, [name]. Please continue to utilize (RCATS) [place], through [place] [facility] [place] [phone]), for physician appointments & provider practice visits. Please keep follow-up appointment with Primary Care Provider, Dr. [name] with [facility] [phone]), scheduled on 09/01/2022 at 9:40 am. Please contact CSW directly (# [phone]), if you have questions, need assistance, or if additional social work needs are identified between now & our next scheduled follow-up outreach call.      [name] assessments and interventions completed:  Yes.  Care Coordination Interventions:  Yes, provided.   Follow up plan: Follow up call scheduled for 09/15/2022 at 11:45  am.  Encounter Outcome:  Pt. Visit Completed.   [name], BSW, MSW, [name]  Licensed Education officer, [facility]  Mailing [place] [address] [place], [place] [address] Physical Address-[address] [place], [place] [address] Toll Free Main # [phone] Fax # [phone] Cell # [phone] [email or web address]

## 2022-09-01 ENCOUNTER — Ambulatory Visit (INDEPENDENT_AMBULATORY_CARE_PROVIDER_SITE_OTHER): Payer: Medicare HMO | Admitting: Family Medicine

## 2022-09-01 VITALS — BP 132/82 | Ht 70.0 in | Wt 199.0 lb

## 2022-09-01 DIAGNOSIS — Z992 Dependence on renal dialysis: Secondary | ICD-10-CM

## 2022-09-01 DIAGNOSIS — I509 Heart failure, unspecified: Secondary | ICD-10-CM

## 2022-09-01 DIAGNOSIS — D631 Anemia in chronic kidney disease: Secondary | ICD-10-CM | POA: Diagnosis not present

## 2022-09-01 DIAGNOSIS — I471 Supraventricular tachycardia, unspecified: Secondary | ICD-10-CM | POA: Diagnosis not present

## 2022-09-01 DIAGNOSIS — Z72820 Sleep deprivation: Secondary | ICD-10-CM | POA: Diagnosis not present

## 2022-09-01 DIAGNOSIS — K219 Gastro-esophageal reflux disease without esophagitis: Secondary | ICD-10-CM | POA: Diagnosis not present

## 2022-09-01 DIAGNOSIS — I25118 Atherosclerotic heart disease of native coronary artery with other forms of angina pectoris: Secondary | ICD-10-CM | POA: Diagnosis not present

## 2022-09-01 DIAGNOSIS — E1159 Type 2 diabetes mellitus with other circulatory complications: Secondary | ICD-10-CM

## 2022-09-01 DIAGNOSIS — C159 Malignant neoplasm of esophagus, unspecified: Secondary | ICD-10-CM | POA: Diagnosis not present

## 2022-09-01 DIAGNOSIS — N186 End stage renal disease: Secondary | ICD-10-CM

## 2022-09-01 DIAGNOSIS — E1122 Type 2 diabetes mellitus with diabetic chronic kidney disease: Secondary | ICD-10-CM | POA: Diagnosis not present

## 2022-09-01 DIAGNOSIS — E1165 Type 2 diabetes mellitus with hyperglycemia: Secondary | ICD-10-CM | POA: Diagnosis not present

## 2022-09-01 DIAGNOSIS — T24231D Burn of second degree of right lower leg, subsequent encounter: Secondary | ICD-10-CM | POA: Diagnosis not present

## 2022-09-01 DIAGNOSIS — I5043 Acute on chronic combined systolic (congestive) and diastolic (congestive) heart failure: Secondary | ICD-10-CM | POA: Diagnosis not present

## 2022-09-01 DIAGNOSIS — I132 Hypertensive heart and chronic kidney disease with heart failure and with stage 5 chronic kidney disease, or end stage renal disease: Secondary | ICD-10-CM | POA: Diagnosis not present

## 2022-09-01 NOTE — Progress Notes (Signed)
   Subjective:    Patient ID: [name], male    DOB: 12-06-1955, 67 y.o.   MRN: [id number]  HPI  Patent arrives for a follow up on a recent hospitalization for chest pain. Patient has follow up scheduled with cardiology  He is here today for hospital follow-up The hospital discharge summary was reviewed Scans reviewed Labs reviewed Medications reviewed with patient Patient is feeling extremely weak fatigued tired little to no energy.  Not having good days.  Has a very hard time within his household.  Needs help around the household.  We have put in for a personal attendant but so far this has not materialized. Review of Systems     Objective:   Physical Exam General-in no acute distress Eyes-no discharge Lungs-respiratory rate normal, CTA CV-no murmurs,RRR Extremities skin warm dry no edema Neuro grossly normal Behavior normal, alert  We did discuss ways to try to stay healthy and stay out of the hospital.  We also discussed the importance of adhering to medications. I sympathize with his fatigue and tiredness but it is multifactorial related into muscle wasting, end-stage renal disease, esophageal cancer, anemia associated with end-stage renal disease, nutritional issues, diabetes, heart disease and heart failure.  We are trying to do the best we can and providing close follow-up he has a follow-up in just a few weeks with cardiology we will do a follow-up a few weeks after that I did tell him that if you felt things were going off course to follow-up with [place] sooner or reach out to [place]      Assessment & Plan:  1. Congestive heart failure, unspecified HF chronicity, unspecified heart failure type ([place]) Stable currently.  No sign of overt CHF currently.  Under good control with current medications  2. ESRD (end stage renal disease) on dialysis [facility]) Dialysis 3 times per week tolerating okay but has extreme fatigue  3. Poor sleep Poor sleep is adding to his extreme  fatigue but patient unfortunately has multiple underlying issues including anemia end-stage renal disease lung issues heart issues diabetes esophageal cancer and muscle wasting  4. DM type 2 causing vascular disease ([place]) Very difficult situation.  No sign of any ulcers currently  5. Malignant neoplasm of esophagus, unspecified location [facility]) Under the care of [facility] currently.  6. SVT (supraventricular tachycardia) Medications were reviewed he is consistently taking it .  All goals all Will apply 7. Paroxysmal supraventricular tachycardia Patient consistently taking his medicines heart rate controlled currently  Will reach out to the social worker to see if there is any additional aspects we need to do to get help him get personal care assistant.

## 2022-09-02 DIAGNOSIS — N186 End stage renal disease: Secondary | ICD-10-CM | POA: Diagnosis not present

## 2022-09-02 DIAGNOSIS — N2581 Secondary hyperparathyroidism of renal origin: Secondary | ICD-10-CM | POA: Diagnosis not present

## 2022-09-02 DIAGNOSIS — Z992 Dependence on renal dialysis: Secondary | ICD-10-CM | POA: Diagnosis not present

## 2022-09-05 DIAGNOSIS — Z992 Dependence on renal dialysis: Secondary | ICD-10-CM | POA: Diagnosis not present

## 2022-09-05 DIAGNOSIS — N186 End stage renal disease: Secondary | ICD-10-CM | POA: Diagnosis not present

## 2022-09-05 DIAGNOSIS — N2581 Secondary hyperparathyroidism of renal origin: Secondary | ICD-10-CM | POA: Diagnosis not present

## 2022-09-06 ENCOUNTER — Encounter: Payer: Self-pay | Admitting: *Deleted

## 2022-09-06 ENCOUNTER — Ambulatory Visit: Payer: Self-pay | Admitting: *Deleted

## 2022-09-06 DIAGNOSIS — I25118 Atherosclerotic heart disease of native coronary artery with other forms of angina pectoris: Secondary | ICD-10-CM | POA: Diagnosis not present

## 2022-09-06 DIAGNOSIS — I5043 Acute on chronic combined systolic (congestive) and diastolic (congestive) heart failure: Secondary | ICD-10-CM | POA: Diagnosis not present

## 2022-09-06 DIAGNOSIS — K219 Gastro-esophageal reflux disease without esophagitis: Secondary | ICD-10-CM | POA: Diagnosis not present

## 2022-09-06 DIAGNOSIS — I132 Hypertensive heart and chronic kidney disease with heart failure and with stage 5 chronic kidney disease, or end stage renal disease: Secondary | ICD-10-CM | POA: Diagnosis not present

## 2022-09-06 DIAGNOSIS — E1122 Type 2 diabetes mellitus with diabetic chronic kidney disease: Secondary | ICD-10-CM | POA: Diagnosis not present

## 2022-09-06 DIAGNOSIS — E1165 Type 2 diabetes mellitus with hyperglycemia: Secondary | ICD-10-CM | POA: Diagnosis not present

## 2022-09-06 DIAGNOSIS — D631 Anemia in chronic kidney disease: Secondary | ICD-10-CM | POA: Diagnosis not present

## 2022-09-06 DIAGNOSIS — T24231D Burn of second degree of right lower leg, subsequent encounter: Secondary | ICD-10-CM | POA: Diagnosis not present

## 2022-09-06 DIAGNOSIS — N186 End stage renal disease: Secondary | ICD-10-CM | POA: Diagnosis not present

## 2022-09-06 NOTE — Patient Instructions (Signed)
Visit Information  Thank you for taking time to visit with me today. Please don't hesitate to contact me if I can be of assistance to you.   Following are the goals we discussed today:   Goals Addressed               This Visit's Progress     Receive Assistance Applying for [place]. (pt-stated)   On track     Care Coordination Interventions:  Interventions Today    Flowsheet Row Most Recent Value  Chronic Disease   Chronic disease during today's visit Diabetes, Hypertension (HTN), Congestive Heart Failure (CHF), Chronic Kidney Disease/End Stage Renal Disease (ESRD), Other  [Major Depression, Single Episde, Severe]  General Interventions   General Interventions Discussed/Reviewed General Interventions Discussed, General Interventions Reviewed, Annual Eye Exam, Labs, Annual Foot Exam, Lipid Profile, Durable Medical Equipment (DME), Vaccines, Health Screening, [employer], Doctor Visits, Communication with, Level of Care  [facility] Care Provider & Representative with [facility]]  Labs Hgb A1c every 3 months, Kidney Function  Vaccines COVID-19, Flu, Pneumonia, RSV, Shingles  [Encouraged]  Doctor Visits Discussed/Reviewed Doctor Visits Discussed, Doctor Visits Reviewed, Annual Wellness Visits, PCP, Specialist  Health Screening Colonoscopy, Prostate  Durable Medical Equipment (DME) BP Cuff, Glucomoter, Insulin Pump, Oxygen, Walker, Community education officer  PCP/Specialist Visits Compliance with follow-up visit  Communication with PCP/Specialists  Level of Care Applications, Assisted Living, [place]  Exercise Interventions   Exercise Discussed/Reviewed Exercise Discussed, Exercise Reviewed, Physical Activity, Weight Managment, Assistive device use and maintanence  Physical Activity Discussed/Reviewed Physical Activity Discussed, Physical Activity Reviewed, Types of exercise   [Encouraged]  Weight Management Weight maintenance  Education Interventions   Education Provided Provided Engineer, site, Provided Education  Provided Verbal Education On Nutrition, Foot Care, Eye Care, Labs, Blood Sugar Monitoring, Mental Health/Coping with Illness, Applications, Exercise, Medication, When to see the doctor, [place] Discussed, Mental Health Reviewed, Coping Strategies, Crisis, Anxiety, Depression, Suicide  Nutrition Interventions   Nutrition Discussed/Reviewed Nutrition Discussed, Nutrition Reviewed, Adding fruits and vegetables, Fluid intake, Portion sizes, Decreasing sugar intake, Decreasing salt, Decreasing fats, Increaing proteins  Pharmacy Interventions   Pharmacy Dicussed/Reviewed Pharmacy Topics Discussed, Pharmacy Topics Reviewed, Medication Adherence, Affording Medications  Safety Interventions   Safety Discussed/Reviewed Safety Discussed, Safety Reviewed, Fall Risk, Home Safety  Home Herbalist, Refer for community resources, Contact home health agency  Advanced Directive Interventions   Advanced Directives Discussed/Reviewed Advanced Directives Discussed, Advanced Directives Reviewed     Active listening & reflection utilized.  Verbalization of feelings encouraged. Emotional support provided. Feelings of frustration regarding loss of independence validated. Caregiver stress & burnout acknowledged. Caregiver resources reviewed. Caregiver support groups provided. Self-enrollment in caregiver support group of interest emphasized. Problem solving interventions indicated. Task-centered strategies employed. Solution-focused activities activated. Cognitive Behavioral Therapy initiated. Acceptance & Commitment Therapy implemented. Client-Centered Therapy performed. CSW collaboration with Wendall Stade,  Clinical Support Staff with [place] [phone]), via secure chat message in Teams, to provide direct contact information for CSW. CSW collaboration with caregiver, [name] to perform 3-way call to [facility] [phone]), to check status of Personal Care Services application.  ~ HIPAA compliant message left on voicemail, as CSW awaits a return call. CSW collaboration with caregiver, [name] to encourage continued outreach attempts to [facility] (# [phone]),  to check status of Personal Care Services application. Please decide on at least 3 agencies of interest, from the list of [place] Providers & be prepared to provide agencies of interest to representative with [place] [phone]), once approved for [place]. Please continue to participate in home health physical & occupational therapy services, through [name] [phone]). Please continue to receive in-home care services & assistance with activities of daily living from private pay caregiver, [name]. Please continue to utilize (RCATS) [place], through [place] [facility] [place] [phone]), for physician appointments & provider practice visits. Please contact CSW directly (# [phone]), if you have questions, need assistance, or if additional social work needs are identified between now & our next scheduled follow-up outreach call.      Our next appointment is by telephone on 09/15/2022 at 11:45 am.  Please call the care guide team at [phone] if you need to cancel or reschedule your appointment.   If you are experiencing a Mental Health or [place] or need someone to talk to, please call the Suicide and Crisis Lifeline: 988 call the [place] National Suicide Prevention Lifeline: (579)641-0467 or TTY:  743-297-5155 TTY (949) 183-8223) to talk to a trained counselor call 1-800-273-TALK (toll free, 24 hour hotline) go to [facility] [address] [place] [phone]) call the [place]: [phone] call 911  Patient verbalizes understanding of instructions and care plan provided today and agrees to view in [place]. Active MyChart status and patient understanding of how to access instructions and care plan via MyChart confirmed with patient.     Telephone follow up appointment with care management team member scheduled for:  09/15/2022 at 11:45 am.  [name], BSW, MSW, [place]  Licensed Clinical Social Worker  [place]  Mailing [place]. [address], [place], [place] [address] Physical Address-[address], [place], [place] [address] Toll Free Main # [phone] Fax # [phone] Cell # [phone] [email or web address]

## 2022-09-06 NOTE — Patient Outreach (Signed)
Care Coordination   Follow Up Visit Note   09/06/2022  Name: [name] MRN: [id number] DOB: 1956/04/28  [name] is a 67 y.o. year old male who sees [name], [name], MD for primary care. I spoke with caregiver, [name] by phone today.  What matters to the patients health and wellness today?  Receive Assistance Applying for [place].   Goals Addressed               This Visit's Progress     Receive Assistance Applying for [place]. (pt-stated)   On track     Care Coordination Interventions:  Interventions Today    Flowsheet Row Most Recent Value  Chronic Disease   Chronic disease during today's visit Diabetes, Hypertension (HTN), Congestive Heart Failure (CHF), Chronic Kidney Disease/End Stage Renal Disease (ESRD), Other  [Major Depression, Single Episde, Severe]  General Interventions   General Interventions Discussed/Reviewed General Interventions Discussed, General Interventions Reviewed, Annual Eye Exam, Labs, Annual Foot Exam, Lipid Profile, Durable Medical Equipment (DME), Vaccines, Health Screening, [employer], Doctor Visits, Communication with, Level of Care  [facility] Care Provider & Representative with [facility]]  Labs Hgb A1c every 3 months, Kidney Function  Vaccines COVID-19, Flu, Pneumonia, RSV, Shingles  [Encouraged]  Doctor Visits Discussed/Reviewed Doctor Visits Discussed, Doctor Visits Reviewed, Annual Wellness Visits, PCP, Specialist  Health Screening Colonoscopy, Prostate  Durable Medical Equipment (DME) BP Cuff, Glucomoter, Insulin Pump, Oxygen, Walker, Community education officer  PCP/Specialist Visits Compliance with follow-up visit  Communication with PCP/Specialists  Level of Care Applications, Assisted Living, [place]  Exercise Interventions   Exercise Discussed/Reviewed Exercise Discussed, Exercise  Reviewed, Physical Activity, Weight Managment, Assistive device use and maintanence  Physical Activity Discussed/Reviewed Physical Activity Discussed, Physical Activity Reviewed, Types of exercise  [Encouraged]  Weight Management Weight maintenance  Education Interventions   Education Provided Provided Engineer, site, Provided Education  Provided Verbal Education On Nutrition, Foot Care, Eye Care, Labs, Blood Sugar Monitoring, Mental Health/Coping with Illness, Applications, Exercise, Medication, When to see the doctor, [place] Discussed, Mental Health Reviewed, Coping Strategies, Crisis, Anxiety, Depression, Suicide  Nutrition Interventions   Nutrition Discussed/Reviewed Nutrition Discussed, Nutrition Reviewed, Adding fruits and vegetables, Fluid intake, Portion sizes, Decreasing sugar intake, Decreasing salt, Decreasing fats, Increaing proteins  Pharmacy Interventions   Pharmacy Dicussed/Reviewed Pharmacy Topics Discussed, Pharmacy Topics Reviewed, Medication Adherence, Affording Medications  Safety Interventions   Safety Discussed/Reviewed Safety Discussed, Safety Reviewed, Fall Risk, Home Safety  Home Herbalist, Refer for community resources, Contact home health agency  Advanced Directive Interventions   Advanced Directives Discussed/Reviewed Advanced Directives Discussed, Advanced Directives Reviewed     Active listening & reflection utilized.  Verbalization of feelings encouraged. Emotional support provided. Feelings of frustration regarding loss of independence validated. Caregiver stress & burnout acknowledged. Caregiver resources reviewed. Caregiver support groups provided. Self-enrollment in caregiver support group of interest emphasized. Problem solving interventions indicated. Task-centered strategies  employed. Solution-focused activities activated. Cognitive Behavioral Therapy initiated. Acceptance & Commitment Therapy implemented. Client-Centered Therapy performed. CSW collaboration with Wendall Stade, Clinical Support Staff with [facility] [phone]), via secure chat message in Teams, to provide direct contact information for CSW. CSW collaboration with caregiver, [name] to perform 3-way call to [facility] [phone]), to check status of Personal Care Services  application.  ~ HIPAA compliant message left on voicemail, as CSW awaits a return call. CSW collaboration with caregiver, [name] to encourage continued outreach attempts to [facility] [phone]), to check status of Personal Care Services application. Please decide on at least 3 agencies of interest, from the list of [place] Providers & be prepared to provide agencies of interest to representative with [place] [phone]), once approved for [place]. Please continue to participate in home health physical & occupational therapy services, through [facility] [phone]). Please continue to receive in-home care services & assistance with activities of daily living from private pay caregiver, [name]. Please continue to utilize ([facility]) [place], through [facility] [phone]), for physician appointments & provider practice visits. Please contact CSW directly (# [phone]), if you have questions, need assistance, or if additional social work needs are identified between now & our next scheduled follow-up outreach call.      [name] assessments and interventions completed:  Yes.  Care Coordination Interventions:  Yes, provided.   Follow up plan: Follow up call scheduled for 09/15/2022 at 11:45  am.  Encounter Outcome:  Pt. Visit Completed.   [name], BSW, MSW, LCSW  Licensed Education officer, [facility]  Mailing [place] [address], [place], [place] [address] Physical Address-[address] [place],  [address] Toll Free Main # [phone] Fax # [phone] Cell # [phone] [email or web address]

## 2022-09-07 DIAGNOSIS — N186 End stage renal disease: Secondary | ICD-10-CM | POA: Diagnosis not present

## 2022-09-07 DIAGNOSIS — Z992 Dependence on renal dialysis: Secondary | ICD-10-CM | POA: Diagnosis not present

## 2022-09-07 DIAGNOSIS — N2581 Secondary hyperparathyroidism of renal origin: Secondary | ICD-10-CM | POA: Diagnosis not present

## 2022-09-08 DIAGNOSIS — E1165 Type 2 diabetes mellitus with hyperglycemia: Secondary | ICD-10-CM | POA: Diagnosis not present

## 2022-09-08 DIAGNOSIS — I5043 Acute on chronic combined systolic (congestive) and diastolic (congestive) heart failure: Secondary | ICD-10-CM | POA: Diagnosis not present

## 2022-09-08 DIAGNOSIS — N186 End stage renal disease: Secondary | ICD-10-CM | POA: Diagnosis not present

## 2022-09-08 DIAGNOSIS — I25118 Atherosclerotic heart disease of native coronary artery with other forms of angina pectoris: Secondary | ICD-10-CM | POA: Diagnosis not present

## 2022-09-08 DIAGNOSIS — K219 Gastro-esophageal reflux disease without esophagitis: Secondary | ICD-10-CM | POA: Diagnosis not present

## 2022-09-08 DIAGNOSIS — E1122 Type 2 diabetes mellitus with diabetic chronic kidney disease: Secondary | ICD-10-CM | POA: Diagnosis not present

## 2022-09-08 DIAGNOSIS — T24231D Burn of second degree of right lower leg, subsequent encounter: Secondary | ICD-10-CM | POA: Diagnosis not present

## 2022-09-08 DIAGNOSIS — D631 Anemia in chronic kidney disease: Secondary | ICD-10-CM | POA: Diagnosis not present

## 2022-09-08 DIAGNOSIS — I132 Hypertensive heart and chronic kidney disease with heart failure and with stage 5 chronic kidney disease, or end stage renal disease: Secondary | ICD-10-CM | POA: Diagnosis not present

## 2022-09-09 ENCOUNTER — Other Ambulatory Visit: Payer: Self-pay

## 2022-09-09 ENCOUNTER — Encounter (HOSPITAL_COMMUNITY): Payer: Self-pay

## 2022-09-09 ENCOUNTER — Emergency Department (HOSPITAL_COMMUNITY): Payer: Medicare HMO

## 2022-09-09 ENCOUNTER — Inpatient Hospital Stay (HOSPITAL_COMMUNITY)
Admission: EM | Admit: 2022-09-09 | Discharge: 2022-09-17 | DRG: 480 | Disposition: A | Discharge status: Skilled Nursing Facility | Payer: Medicare HMO | Attending: Internal Medicine | Admitting: Internal Medicine

## 2022-09-09 DIAGNOSIS — N2581 Secondary hyperparathyroidism of renal origin: Secondary | ICD-10-CM | POA: Diagnosis present

## 2022-09-09 DIAGNOSIS — Z8249 Family history of ischemic heart disease and other diseases of the circulatory system: Secondary | ICD-10-CM

## 2022-09-09 DIAGNOSIS — S72009A Fracture of unspecified part of neck of unspecified femur, initial encounter for closed fracture: Secondary | ICD-10-CM

## 2022-09-09 DIAGNOSIS — Z8673 Personal history of transient ischemic attack (TIA), and cerebral infarction without residual deficits: Secondary | ICD-10-CM | POA: Diagnosis not present

## 2022-09-09 DIAGNOSIS — Z888 Allergy status to other drugs, medicaments and biological substances status: Secondary | ICD-10-CM

## 2022-09-09 DIAGNOSIS — N186 End stage renal disease: Secondary | ICD-10-CM | POA: Diagnosis not present

## 2022-09-09 DIAGNOSIS — I2510 Atherosclerotic heart disease of native coronary artery without angina pectoris: Secondary | ICD-10-CM | POA: Diagnosis not present

## 2022-09-09 DIAGNOSIS — S72141A Displaced intertrochanteric fracture of right femur, initial encounter for closed fracture: Principal | ICD-10-CM | POA: Diagnosis present

## 2022-09-09 DIAGNOSIS — E1122 Type 2 diabetes mellitus with diabetic chronic kidney disease: Secondary | ICD-10-CM | POA: Diagnosis not present

## 2022-09-09 DIAGNOSIS — Z01818 Encounter for other preprocedural examination: Secondary | ICD-10-CM | POA: Diagnosis not present

## 2022-09-09 DIAGNOSIS — D631 Anemia in chronic kidney disease: Secondary | ICD-10-CM | POA: Diagnosis not present

## 2022-09-09 DIAGNOSIS — R112 Nausea with vomiting, unspecified: Secondary | ICD-10-CM | POA: Diagnosis not present

## 2022-09-09 DIAGNOSIS — I4719 Other supraventricular tachycardia: Secondary | ICD-10-CM | POA: Diagnosis not present

## 2022-09-09 DIAGNOSIS — E440 Moderate protein-calorie malnutrition: Secondary | ICD-10-CM | POA: Diagnosis not present

## 2022-09-09 DIAGNOSIS — S80919A Unspecified superficial injury of unspecified knee, initial encounter: Secondary | ICD-10-CM | POA: Diagnosis not present

## 2022-09-09 DIAGNOSIS — Z91030 Bee allergy status: Secondary | ICD-10-CM

## 2022-09-09 DIAGNOSIS — Z87891 Personal history of nicotine dependence: Secondary | ICD-10-CM | POA: Diagnosis not present

## 2022-09-09 DIAGNOSIS — R0902 Hypoxemia: Secondary | ICD-10-CM | POA: Diagnosis not present

## 2022-09-09 DIAGNOSIS — E1165 Type 2 diabetes mellitus with hyperglycemia: Secondary | ICD-10-CM | POA: Diagnosis not present

## 2022-09-09 DIAGNOSIS — Z66 Do not resuscitate: Secondary | ICD-10-CM | POA: Diagnosis present

## 2022-09-09 DIAGNOSIS — W109XXA Fall (on) (from) unspecified stairs and steps, initial encounter: Secondary | ICD-10-CM | POA: Diagnosis present

## 2022-09-09 DIAGNOSIS — Z794 Long term (current) use of insulin: Secondary | ICD-10-CM | POA: Diagnosis not present

## 2022-09-09 DIAGNOSIS — Z8501 Personal history of malignant neoplasm of esophagus: Secondary | ICD-10-CM | POA: Diagnosis not present

## 2022-09-09 DIAGNOSIS — S0990XA Unspecified injury of head, initial encounter: Secondary | ICD-10-CM | POA: Diagnosis not present

## 2022-09-09 DIAGNOSIS — S72001A Fracture of unspecified part of neck of right femur, initial encounter for closed fracture: Secondary | ICD-10-CM

## 2022-09-09 DIAGNOSIS — R262 Difficulty in walking, not elsewhere classified: Secondary | ICD-10-CM | POA: Diagnosis not present

## 2022-09-09 DIAGNOSIS — S72142A Displaced intertrochanteric fracture of left femur, initial encounter for closed fracture: Principal | ICD-10-CM

## 2022-09-09 DIAGNOSIS — F32A Depression, unspecified: Secondary | ICD-10-CM | POA: Diagnosis present

## 2022-09-09 DIAGNOSIS — I252 Old myocardial infarction: Secondary | ICD-10-CM | POA: Diagnosis not present

## 2022-09-09 DIAGNOSIS — H5461 Unqualified visual loss, right eye, normal vision left eye: Secondary | ICD-10-CM | POA: Diagnosis present

## 2022-09-09 DIAGNOSIS — Z833 Family history of diabetes mellitus: Secondary | ICD-10-CM

## 2022-09-09 DIAGNOSIS — E876 Hypokalemia: Secondary | ICD-10-CM | POA: Diagnosis present

## 2022-09-09 DIAGNOSIS — E113393 Type 2 diabetes mellitus with moderate nonproliferative diabetic retinopathy without macular edema, bilateral: Secondary | ICD-10-CM | POA: Diagnosis not present

## 2022-09-09 DIAGNOSIS — I120 Hypertensive chronic kidney disease with stage 5 chronic kidney disease or end stage renal disease: Secondary | ICD-10-CM | POA: Diagnosis not present

## 2022-09-09 DIAGNOSIS — G4730 Sleep apnea, unspecified: Secondary | ICD-10-CM | POA: Diagnosis present

## 2022-09-09 DIAGNOSIS — I5042 Chronic combined systolic (congestive) and diastolic (congestive) heart failure: Secondary | ICD-10-CM | POA: Diagnosis not present

## 2022-09-09 DIAGNOSIS — Z7401 Bed confinement status: Secondary | ICD-10-CM | POA: Diagnosis not present

## 2022-09-09 DIAGNOSIS — Z7982 Long term (current) use of aspirin: Secondary | ICD-10-CM

## 2022-09-09 DIAGNOSIS — M898X9 Other specified disorders of bone, unspecified site: Secondary | ICD-10-CM | POA: Diagnosis present

## 2022-09-09 DIAGNOSIS — Y92009 Unspecified place in unspecified non-institutional (private) residence as the place of occurrence of the external cause: Secondary | ICD-10-CM

## 2022-09-09 DIAGNOSIS — E1169 Type 2 diabetes mellitus with other specified complication: Secondary | ICD-10-CM | POA: Diagnosis not present

## 2022-09-09 DIAGNOSIS — M6281 Muscle weakness (generalized): Secondary | ICD-10-CM | POA: Diagnosis not present

## 2022-09-09 DIAGNOSIS — I471 Supraventricular tachycardia, unspecified: Secondary | ICD-10-CM | POA: Diagnosis present

## 2022-09-09 DIAGNOSIS — E1159 Type 2 diabetes mellitus with other circulatory complications: Secondary | ICD-10-CM | POA: Diagnosis present

## 2022-09-09 DIAGNOSIS — Z79899 Other long term (current) drug therapy: Secondary | ICD-10-CM

## 2022-09-09 DIAGNOSIS — K2270 Barrett's esophagus without dysplasia: Secondary | ICD-10-CM | POA: Diagnosis not present

## 2022-09-09 DIAGNOSIS — Z800 Family history of malignant neoplasm of digestive organs: Secondary | ICD-10-CM

## 2022-09-09 DIAGNOSIS — M25572 Pain in left ankle and joints of left foot: Secondary | ICD-10-CM | POA: Diagnosis not present

## 2022-09-09 DIAGNOSIS — N250 Renal osteodystrophy: Secondary | ICD-10-CM | POA: Diagnosis not present

## 2022-09-09 DIAGNOSIS — Z951 Presence of aortocoronary bypass graft: Secondary | ICD-10-CM | POA: Diagnosis not present

## 2022-09-09 DIAGNOSIS — M25551 Pain in right hip: Secondary | ICD-10-CM | POA: Diagnosis not present

## 2022-09-09 DIAGNOSIS — Z5982 Transportation insecurity: Secondary | ICD-10-CM

## 2022-09-09 DIAGNOSIS — Z992 Dependence on renal dialysis: Secondary | ICD-10-CM | POA: Diagnosis not present

## 2022-09-09 DIAGNOSIS — E119 Type 2 diabetes mellitus without complications: Secondary | ICD-10-CM | POA: Diagnosis present

## 2022-09-09 DIAGNOSIS — I132 Hypertensive heart and chronic kidney disease with heart failure and with stage 5 chronic kidney disease, or end stage renal disease: Secondary | ICD-10-CM | POA: Diagnosis present

## 2022-09-09 DIAGNOSIS — M25561 Pain in right knee: Secondary | ICD-10-CM | POA: Diagnosis not present

## 2022-09-09 DIAGNOSIS — S72141D Displaced intertrochanteric fracture of right femur, subsequent encounter for closed fracture with routine healing: Secondary | ICD-10-CM | POA: Diagnosis not present

## 2022-09-09 DIAGNOSIS — R531 Weakness: Secondary | ICD-10-CM | POA: Diagnosis not present

## 2022-09-09 HISTORY — DX: Fracture of unspecified part of neck of unspecified femur, initial encounter for closed fracture: S72.009A

## 2022-09-09 LAB — BASIC METABOLIC PANEL
Anion gap: 12 (ref 5–15)
BUN: 17 mg/dL (ref 8–23)
CO2: 27 mmol/L (ref 22–32)
Calcium: 8.3 mg/dL — ABNORMAL LOW (ref 8.9–10.3)
Chloride: 95 mmol/L — ABNORMAL LOW (ref 98–111)
Creatinine, Ser: 2.63 mg/dL — ABNORMAL HIGH (ref 0.61–1.24)
GFR, Estimated: 26 mL/min — ABNORMAL LOW (ref >60)
Glucose, Bld: 240 mg/dL — ABNORMAL HIGH (ref 70–99)
Potassium: 3.3 mmol/L — ABNORMAL LOW (ref 3.5–5.1)
Sodium: 134 mmol/L — ABNORMAL LOW (ref 135–145)

## 2022-09-09 LAB — CBC WITH DIFFERENTIAL/PLATELET
Abs Immature Granulocytes: 0.06 10*3/uL (ref 0.00–0.07)
Basophils Absolute: 0.1 10*3/uL (ref 0.0–0.1)
Basophils Relative: 1 %
Eosinophils Absolute: 0.1 10*3/uL (ref 0.0–0.5)
Eosinophils Relative: 1 %
HCT: 35.5 % — ABNORMAL LOW (ref 39.0–52.0)
Hemoglobin: 11.2 g/dL — ABNORMAL LOW (ref 13.0–17.0)
Immature Granulocytes: 1 %
Lymphocytes Relative: 7 %
Lymphs Abs: 0.6 10*3/uL — ABNORMAL LOW (ref 0.7–4.0)
MCH: 29.1 pg (ref 26.0–34.0)
MCHC: 31.5 g/dL (ref 30.0–36.0)
MCV: 92.2 fL (ref 80.0–100.0)
Monocytes Absolute: 0.8 10*3/uL (ref 0.1–1.0)
Monocytes Relative: 9 %
Neutro Abs: 7.4 10*3/uL (ref 1.7–7.7)
Neutrophils Relative %: 81 %
Platelets: 202 10*3/uL (ref 150–400)
RBC: 3.85 MIL/uL — ABNORMAL LOW (ref 4.22–5.81)
RDW: 19.2 % — ABNORMAL HIGH (ref 11.5–15.5)
WBC: 9.1 10*3/uL (ref 4.0–10.5)
nRBC: 0 % (ref 0.0–0.2)

## 2022-09-09 LAB — GLUCOSE, CAPILLARY: Glucose-Capillary: 240 mg/dL — ABNORMAL HIGH (ref 70–99)

## 2022-09-09 MED ORDER — INSULIN ASPART 100 UNIT/ML IJ SOLN
0.0000 [IU] | Freq: Three times a day (TID) | INTRAMUSCULAR | Status: DC
  Administered 2022-09-10: 3 [IU] via SUBCUTANEOUS
  Administered 2022-09-10: 2 [IU] via SUBCUTANEOUS
  Administered 2022-09-11 (×3): 5 [IU] via SUBCUTANEOUS
  Administered 2022-09-12 (×2): 3 [IU] via SUBCUTANEOUS
  Administered 2022-09-13: 2 [IU] via SUBCUTANEOUS
  Administered 2022-09-13 (×2): 3 [IU] via SUBCUTANEOUS
  Administered 2022-09-14: 2 [IU] via SUBCUTANEOUS
  Administered 2022-09-14 – 2022-09-15 (×3): 3 [IU] via SUBCUTANEOUS
  Administered 2022-09-16: 8 [IU] via SUBCUTANEOUS
  Administered 2022-09-16: 5 [IU] via SUBCUTANEOUS

## 2022-09-09 MED ORDER — SERTRALINE HCL 100 MG PO TABS
100.0000 mg | ORAL_TABLET | Freq: Every day | ORAL | Status: DC
  Administered 2022-09-11 – 2022-09-17 (×7): 100 mg via ORAL
  Filled 2022-09-09 (×8): qty 1

## 2022-09-09 MED ORDER — PANTOPRAZOLE SODIUM 40 MG PO TBEC
40.0000 mg | DELAYED_RELEASE_TABLET | Freq: Two times a day (BID) | ORAL | Status: DC
  Administered 2022-09-09 – 2022-09-16 (×14): 40 mg via ORAL
  Filled 2022-09-09 (×16): qty 1

## 2022-09-09 MED ORDER — ACETAMINOPHEN 650 MG RE SUPP: 650.0000 mg | Freq: Four times a day (QID) | RECTAL | Status: DC | PRN

## 2022-09-09 MED ORDER — HYDROMORPHONE HCL 1 MG/ML IJ SOLN: 1.0000 mg | Freq: Once | INTRAMUSCULAR | Status: DC

## 2022-09-09 MED ORDER — AMIODARONE HCL 200 MG PO TABS
200.0000 mg | ORAL_TABLET | Freq: Every day | ORAL | Status: DC
  Administered 2022-09-11 – 2022-09-17 (×7): 200 mg via ORAL
  Filled 2022-09-09 (×8): qty 1

## 2022-09-09 MED ORDER — MORPHINE SULFATE (PF) 2 MG/ML IV SOLN
2.0000 mg | INTRAVENOUS | Status: DC | PRN
  Administered 2022-09-10 – 2022-09-15 (×12): 2 mg via INTRAVENOUS
  Filled 2022-09-09 (×12): qty 1

## 2022-09-09 MED ORDER — INSULIN ASPART 100 UNIT/ML IJ SOLN
0.0000 [IU] | Freq: Every day | INTRAMUSCULAR | Status: DC
  Administered 2022-09-09 – 2022-09-15 (×4): 2 [IU] via SUBCUTANEOUS

## 2022-09-09 MED ORDER — MORPHINE SULFATE (PF) 4 MG/ML IV SOLN
4.0000 mg | Freq: Once | INTRAVENOUS | Status: AC
  Administered 2022-09-09: 4 mg via INTRAVENOUS
  Filled 2022-09-09: qty 1

## 2022-09-09 MED ORDER — HYDRALAZINE HCL 50 MG PO TABS
50.0000 mg | ORAL_TABLET | Freq: Three times a day (TID) | ORAL | Status: DC
  Administered 2022-09-09 – 2022-09-17 (×15): 50 mg via ORAL
  Filled 2022-09-09 (×19): qty 1

## 2022-09-09 MED ORDER — CHLORHEXIDINE GLUCONATE 4 % EX LIQD
60.0000 mL | Freq: Once | CUTANEOUS | Status: AC
  Administered 2022-09-10: 4 via TOPICAL
  Filled 2022-09-09 (×2): qty 60

## 2022-09-09 MED ORDER — DOXAZOSIN MESYLATE 2 MG PO TABS
2.0000 mg | ORAL_TABLET | Freq: Every day | ORAL | Status: DC
  Administered 2022-09-11 – 2022-09-17 (×7): 2 mg via ORAL
  Filled 2022-09-09 (×9): qty 1

## 2022-09-09 MED ORDER — CEFAZOLIN SODIUM-DEXTROSE 2-4 GM/100ML-% IV SOLN
2.0000 g | INTRAVENOUS | Status: AC
  Administered 2022-09-10: 2 g via INTRAVENOUS

## 2022-09-09 MED ORDER — ONDANSETRON HCL 4 MG/2ML IJ SOLN: 4.0000 mg | Freq: Four times a day (QID) | INTRAMUSCULAR | Status: DC | PRN

## 2022-09-09 MED ORDER — ONDANSETRON HCL 4 MG PO TABS
4.0000 mg | ORAL_TABLET | Freq: Four times a day (QID) | ORAL | Status: DC | PRN
  Administered 2022-09-13: 4 mg via ORAL
  Filled 2022-09-09: qty 1

## 2022-09-09 MED ORDER — TRANEXAMIC ACID-NACL 1000-0.7 MG/100ML-% IV SOLN
1000.0000 mg | INTRAVENOUS | Status: AC
  Administered 2022-09-10: 1000 mg via INTRAVENOUS

## 2022-09-09 MED ORDER — HYDROMORPHONE HCL 1 MG/ML IJ SOLN
1.0000 mg | Freq: Once | INTRAMUSCULAR | Status: AC
  Administered 2022-09-09: 1 mg via INTRAVENOUS
  Filled 2022-09-09: qty 1

## 2022-09-09 MED ORDER — OXYCODONE HCL 5 MG PO TABS: 5.0000 mg | ORAL_TABLET | ORAL | Status: DC | PRN

## 2022-09-09 MED ORDER — POVIDONE-IODINE 10 % EX SWAB: 2.0000 | Freq: Once | CUTANEOUS | Status: DC

## 2022-09-09 MED ORDER — ACETAMINOPHEN 325 MG PO TABS: 650.0000 mg | ORAL_TABLET | Freq: Four times a day (QID) | ORAL | Status: DC | PRN

## 2022-09-09 NOTE — ED Notes (Signed)
Patient transported to CT 

## 2022-09-09 NOTE — Assessment & Plan Note (Signed)
-   Patient takes 8 units of basal insulin at home - Holding basal insulin while here but monitor CBGs and give sliding scale coverage - Currently heart healthy diet, but n.p.o. after midnight in case they should decide to do surgery tomorrow

## 2022-09-09 NOTE — Assessment & Plan Note (Signed)
Continue Zoloft 

## 2022-09-09 NOTE — Assessment & Plan Note (Signed)
-   Glucose 240 - Sliding scale coverage - Continue to monitor

## 2022-09-09 NOTE — Progress Notes (Signed)
Called regarding hip fracture, with complicating comorbidities including dialysis dependency.  Graft plan for transfer to [facility] from [facility], and surgery tomorrow versus the next day depending on medical optimization and operating room availability.  Dr. Zachery Dakins will be on-call tomorrow.  Marchia Bond, MD

## 2022-09-09 NOTE — Assessment & Plan Note (Signed)
-   Currently holding aspirin for possible surgery - Continue metoprolol - Resume Repatha at discharge

## 2022-09-09 NOTE — ED Notes (Signed)
Ortho @ [facility] paged to [facility] @ [phone]

## 2022-09-09 NOTE — H&P (Signed)
History and Physical    Patient: [name] [id number] DOB: 10/16/1955 DOA: 09/09/2022 DOS: the patient was seen and examined on 09/09/2022 PCP: [name], MD  Patient coming from: Home  Chief Complaint:  Chief Complaint  Patient presents with   Fall   HPI: [name] is a 67 y.o. male with medical history significant of coronary artery disease, CHF, depression, hypertension, history of stroke, diabetes mellitus type 2, and more presents the ED with a chief complaint of fall.  Patient reports he was coming down some stairs when he slipped and fell on the last stair.  He did hit his head.  He is on aspirin, but no anticoagulation.  He did not lose consciousness.  He had immediate pain in his right hip.  He had nausea and vomiting from the pain.  The pain is a dull ache at his right hip.  Morphine has helped it.  Moving him at all makes the pain worse.  Patient reports that in the days leading up to this he has been in his normal state of health.  Nothing else has been bothering him including abdominal pain, diarrhea, constipation, dysuria, acute asymmetric weakness, chest pain, palpitations, dizziness.  Patient does not smoke, drinks occasionally, does not use illicit drugs.  He is vaccinated for COVID and flu.  Patient is DNR which is confirmed today during exam for admission. Review of Systems: As mentioned in the history of present illness. All other systems reviewed and are negative. Past Medical History:  Diagnosis Date   Anemia    Arthritis    hips shoulders   CAD (coronary artery disease) 03/03/2019   Cancer ([place])    Esophageal cancer 2022   CHF (congestive heart failure) ([place]) 2020   Chronic combined systolic and diastolic heart failure ([place]) 04/26/2018   Admitted 11/14-11/20/19-diuresed 10L   Depression 05/31/2021   Fatigue 10/14/2020   Hypertension    Myocardial infarction [facility])    Sleep apnea    uses a bipap machine   Stroke ([place]) 12/14/2020   no last  weakness or paralysis   Type 2 diabetes mellitus with diabetic nephropathy ([place]) 04/23/2019   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   Past Surgical History:  Procedure Laterality Date   AV FISTULA PLACEMENT Left 12/21/2021   Procedure: LEFT ARM ARTERIOVENOUS (AV) FISTULA CREATION;  Surgeon: Rosetta Posner, MD;  [facility];  Service: Vascular;  Laterality: Left;   BIOPSY  06/27/2019   Procedure: BIOPSY;  Surgeon: Rogene Houston, MD;  [facility];  Service: Endoscopy;;  ascending colon polyp   BIOPSY  10/21/2020   Procedure: BIOPSY;  Surgeon: Rogene Houston, MD;  [facility];  Service: Endoscopy;;  esophageal,gastric polyp   BIOPSY  02/24/2021   Procedure: BIOPSY;  Surgeon: Rogene Houston, MD;  [facility];  Service: Endoscopy;;  distal and proximal esophageal biopsies    BIOPSY  05/30/2022   Procedure: BIOPSY;  Surgeon: Eloise Harman, DO;  [facility];  Service: Endoscopy;;   CARDIAC SURGERY     CATARACT EXTRACTION     COLONOSCOPY N/A 06/27/2019   [name]:Diverticulosis in the entire examined colon. tubular adenoma in ascending colon, 73mm tubular adenoma in prox sigmoid. external hemorrhoids   CORONARY ARTERY BYPASS GRAFT N/A 03/25/2019   Procedure: CORONARY ARTERY BYPASS GRAFTING (CABG) X  4 USING LEFT INTERNAL MAMMARY ARTERY AND RIGHT SAPHENOUS VEIN GRAFTS;  Surgeon: [name], MD;  Location:  [facility] OR;  Service: Open Heart Surgery;  Laterality: N/A;   ESOPHAGEAL BRUSHING  03/08/2022   Procedure: ESOPHAGEAL BRUSHING;  Surgeon: Eloise Harman, DO;  [facility];  Service: Endoscopy;;   ESOPHAGOGASTRODUODENOSCOPY (EGD) WITH PROPOFOL N/A 10/21/2020   rehman:Normal hypopharynx.Normal proximal esophagus and mid esophagus.Esophageal mucosal changes consistent with long-segment Barrett's esophagus. (Focal low-grade dysplasia and atypia proximally) z line irregular 37 cm from incisors, 3 cm HH, single gastric polyp  (fundic gland) normal duodenal bulb/second portion of duodenum, proximal margin of Barrett's at 34 cm   ESOPHAGOGASTRODUODENOSCOPY (EGD) WITH PROPOFOL N/A 02/24/2021   Procedure: ESOPHAGOGASTRODUODENOSCOPY (EGD) WITH PROPOFOL;  Surgeon: Rogene Houston, MD;  [facility];  Service: Endoscopy;  Laterality: N/A;  1:35   ESOPHAGOGASTRODUODENOSCOPY (EGD) WITH PROPOFOL N/A 03/21/2021   Procedure: ESOPHAGOGASTRODUODENOSCOPY (EGD) WITH PROPOFOL;  Surgeon: Carol Ada, MD;  Location: [facility];  Service: Endoscopy;  Laterality: N/A;   ESOPHAGOGASTRODUODENOSCOPY (EGD) WITH PROPOFOL N/A 10/23/2021   Procedure: ESOPHAGOGASTRODUODENOSCOPY (EGD) WITH PROPOFOL;  Surgeon: Doran Stabler, MD;  Location: [facility];  Service: Gastroenterology;  Laterality: N/A;   ESOPHAGOGASTRODUODENOSCOPY (EGD) WITH PROPOFOL N/A 03/08/2022   Procedure: ESOPHAGOGASTRODUODENOSCOPY (EGD) WITH PROPOFOL;  Surgeon: Eloise Harman, DO;  [facility];  Service: Endoscopy;  Laterality: N/A;   ESOPHAGOGASTRODUODENOSCOPY (EGD) WITH PROPOFOL N/A 05/27/2022   Procedure: ESOPHAGOGASTRODUODENOSCOPY (EGD) WITH PROPOFOL;  Surgeon: Harvel Quale, MD;  [facility];  Service: Gastroenterology;  Laterality: N/A;   ESOPHAGOGASTRODUODENOSCOPY (EGD) WITH PROPOFOL N/A 05/30/2022   Procedure: ESOPHAGOGASTRODUODENOSCOPY (EGD) WITH PROPOFOL;  Surgeon: Eloise Harman, DO;  [facility];  Service: Endoscopy;  Laterality: N/A;   EXCISION MORTON'S NEUROMA     EYE SURGERY Left    retina   INCISION AND DRAINAGE OF WOUND Right 06/28/2021   Procedure: IRRIGATION AND DEBRIDEMENT WOUND;  Surgeon: [name], MD;  Location: [place];  Service: Plastics;  Laterality: Right;  1.5 hour   INSERTION OF DIALYSIS CATHETER Right 12/21/2021   Procedure: INSERTION OF TUNNELED DIALYSIS CATHETER;  Surgeon: Rosetta Posner, MD;  [facility];  Service: Vascular;  Laterality: Right;   LEFT HEART CATH AND  CORS/GRAFTS ANGIOGRAPHY N/A 02/01/2022   Procedure: LEFT HEART CATH AND CORS/GRAFTS ANGIOGRAPHY;  Surgeon: Martinique, Peter M, MD;  Location: [facility];  Service: Cardiovascular;  Laterality: N/A;   POLYPECTOMY  06/27/2019   Procedure: POLYPECTOMY;  Surgeon: Rogene Houston, MD;  [facility];  Service: Endoscopy;;  proximal sigmoid colon   RIGHT/LEFT HEART CATH AND CORONARY ANGIOGRAPHY N/A 03/12/2019   Procedure: RIGHT/LEFT HEART CATH AND CORONARY ANGIOGRAPHY;  Surgeon: [name], MD;  Location: [facility];  Service: Cardiovascular;  Laterality: N/A;   SHOULDER ARTHROSCOPY WITH ROTATOR CUFF REPAIR AND SUBACROMIAL DECOMPRESSION Right 08/26/2020   Procedure: RIGHT SHOULDER MINI OPEN ROTATOR CUFF REPAIR AND SUBACROMIAL DECOMPRESSION WITH PATCH GRAFT;  Surgeon: Susa Day, MD;  [facility];  Service: Orthopedics;  Laterality: Right;  90 MINS GENERAL WITH BLOCK   SKIN SPLIT GRAFT Right 06/28/2021   Procedure: SKIN GRAFT SPLIT THICKNESS;  Surgeon: [name], MD;  Location: [facility];  Service: Plastics;  Laterality: Right;   TEE WITHOUT CARDIOVERSION N/A 03/25/2019   Procedure: TRANSESOPHAGEAL ECHOCARDIOGRAM (TEE);  Surgeon: [name], MD;  Location: [facility];  Service: Open Heart Surgery;  Laterality: N/A;   Social History:  reports that he quit smoking about 34 years ago. His smoking use included  cigarettes. He has been exposed to tobacco smoke. He has never used smokeless tobacco. He reports that he does not currently use alcohol after a past usage of about 2.0 standard drinks of alcohol per week. He reports that he does not use drugs.  Allergies  Allergen Reactions   Bee Venom Anaphylaxis   Atorvastatin Other (See Comments)    myalgia   Diltiazem Itching   Lopressor [Metoprolol]     NIGHTMARES    Reglan [Metoclopramide] Other (See Comments)    Suicidal    Rosuvastatin Other (See Comments)    myalgias   Valsartan Itching    Family  History  Problem Relation Age of Onset   Colon cancer Mother    Heart Problems Father    Diabetes Father    Valvular heart disease Father    Sleep apnea Neg Hx    Stroke Neg Hx     Prior to Admission medications   Medication Sig Start Date End Date Taking? Authorizing Provider  acetaminophen (TYLENOL) 325 MG tablet Take 2 tablets (650 mg total) by mouth every 6 (six) hours as needed for mild pain, moderate pain or fever. 08/02/21  Yes Allie Bossier, MD  albuterol (VENTOLIN HFA) 108 (90 Base) MCG/ACT inhaler INHALE TWO PUFFS BY MOUTH EVERY 6 HOURS AS NEEDED Patient taking differently: Inhale 2 puffs into the lungs every 6 (six) hours as needed for wheezing or shortness of breath. 08/11/22  Yes [name], [name], MD  amiodarone (PACERONE) 200 MG tablet Take 1 tablet (200 mg total) by mouth 2 (two) times daily. Through 09/08/22.  Then 1 tab (200 mg) once daily starting 09/09/22 Patient taking differently: Take 200 mg by mouth daily. Through 09/08/22.  Then 1 tab (200 mg) once daily starting 09/09/22 08/21/22  Yes Tat, Shanon Brow, MD  aspirin EC 325 MG tablet Take 1 tablet (325 mg total) by mouth daily with breakfast. 06/23/22  Yes Emokpae, Courage, MD  calcium acetate (PHOSLO) 667 MG capsule Take 667 mg by mouth in the morning, at noon, and at bedtime.   Yes [provider]  doxazosin (CARDURA) 2 MG tablet TAKE 1 TABLET BY MOUTH DAILY 07/29/22  Yes [name], Scott A, MD  EPINEPHrine 0.3 mg/0.3 mL IJ SOAJ injection Inject 0.3 mg into the muscle as needed for anaphylaxis. 01/25/21  Yes [name], [name], MD  Evolocumab (REPATHA SURECLICK) [id number] MG/ML SOAJ Inject 140 mg into the skin every 14 (fourteen) days. 06/29/22  Yes Loel Dubonnet, NP  hydrALAZINE (APRESOLINE) 50 MG tablet Take 1 tablet (50 mg total) by mouth 3 (three) times daily. 06/23/22  Yes Emokpae, Courage, MD  LANTUS 100 UNIT/ML injection 8 mLs daily. Sliding scale 04/07/22  Yes [provider]  metoprolol succinate (TOPROL-XL) 50 MG 24  hr tablet Take 1 tablet (50 mg total) by mouth daily. 08/02/22  Yes Loel Dubonnet, NP  nitroGLYCERIN (NITROSTAT) 0.4 MG SL tablet Place 1 tablet (0.4 mg total) under the tongue every 5 (five) minutes as needed for chest pain. 09/14/21  Yes Loel Dubonnet, NP  NOVOLOG FLEXPEN 100 UNIT/ML FlexPen Inject 4-10 Units into the skin 3 (three) times daily with meals. 07/07/22  Yes [provider]  ondansetron (ZOFRAN-ODT) 4 MG disintegrating tablet Take 1 tablet by mouth every 8 hours as needed for nausea and vomiting. 08/02/22  Yes Loel Dubonnet, NP  pantoprazole (PROTONIX) 40 MG tablet Take 1 tablet (40 mg total) by mouth 2 (two) times daily. 05/30/22  Deveron Furlong, MD  sertraline (ZOLOFT) 100 MG tablet TAKE 1 TABLET BY MOUTH DAILY 07/29/22  Yes [name], MD    Physical Exam: Vitals:   09/09/22 1732 09/09/22 1734 09/09/22 1735 09/09/22 2021  BP:  (!) 146/72  (!) 144/69  Pulse:  72  72  Resp:    18  Temp:  98.1 F (36.7 C)  98.1 F (36.7 C)  TempSrc:  Oral  Oral  SpO2: 90% 95%  95%  Weight:   81.6 kg   Height:   5\' 10"  (1.778 m)    1.  General: Patient lying supine in bed,  no acute distress   2. Psychiatric: Somnolent secondary to pain medication but wakes to voice and oriented x 3, mood and behavior normal for situation, pleasant and cooperative with exam   3. Neurologic: Speech and language are normal, face is symmetric, moves all 4 extremities voluntarily, at baseline without acute deficits on limited exam   4. HEENMT:  Head is atraumatic, normocephalic, pupils reactive to light, neck is supple, trachea is midline, mucous membranes are moist   5. Respiratory : Lungs are clear to auscultation bilaterally without wheezing, rhonchi, rales, no cyanosis, no increase in work of breathing or accessory muscle use   6. Cardiovascular : Heart rate normal, rhythm is regular, no murmurs, rubs or gallops, no peripheral edema, peripheral pulses palpated   7.  Gastrointestinal:  Abdomen is soft, nondistended, nontender to palpation bowel sounds active, no masses or organomegaly palpated   8. Skin:  Skin is warm, dry and intact without rashes, acute lesions, or ulcers on limited exam   9.Musculoskeletal:  Right leg is externally rotated, shortened  Data Reviewed: In the ED Temp 98, heart rate 72, respiratory rate 13 at the time of my exam, satting 90-95% No leukocytosis with white blood cell count of 9.1, hemoglobin 11.2, platelets 202 Hypokalemia at 3.3 Elevated creatinine at 2.63 CT head shows no acute findings X-ray knee shows joint effusion Right hip shows comminuted intertrochanteric fracture of right hip with mildly displaced fragments Dr. Mardelle Matte is expecting patient at [facility] and Plan: * Hip fracture ([place]) - Mechanical fall - X-ray shows comminuted intertrochanteric fracture of right hip with mildly displaced fragments - Ortho consulted, Dr. Mardelle Matte [facility] - Continue pain control - N.p.o. after midnight in case he should decide to do surgery tomorrow - Continue to monitor  ESRD (end stage renal disease) on dialysis [facility]) - Monday Wednesday Friday dialysis - Last dialyzed today - Dr. Joylene Grapes notified of patient and that he will be transferring to [facility]  Chronic combined systolic and diastolic CHF (congestive heart failure) (HCC) - Currently holding aspirin for possible surgery - Continue metoprolol - Resume Repatha at discharge  Hypokalemia - Potassium 3.3 - Defer to nephrology - Continue to monitor  Paroxysmal supraventricular tachycardia - Continue amiodarone - Continue to monitor  DM type 2 causing vascular disease ([place]) - Patient takes 8 units of basal insulin at home - Holding basal insulin while here but monitor CBGs and give sliding scale coverage - Currently heart healthy diet, but n.p.o. after midnight in case they should decide to do surgery  tomorrow  Depression - Continue Zoloft  Hyperglycemia due to diabetes mellitus (HCC)-resolved as of 09/09/2022 - Glucose 240 - Sliding scale coverage - Continue to monitor      Advance Care Planning:   Code Status: Full Code DNR  Consults: Nephrology and orthopedics  Family Communication: Friend/caretaker at bedside  Severity  of Illness: The appropriate patient status for this patient is INPATIENT. Inpatient status is judged to be reasonable and necessary in order to provide the required intensity of service to ensure the patient's safety. The patient's presenting symptoms, physical exam findings, and initial radiographic and laboratory data in the context of their chronic comorbidities is felt to place them at high risk for further clinical deterioration. Furthermore, it is not anticipated that the patient will be medically stable for discharge from the hospital within 2 midnights of admission.   * I certify that at the point of admission it is my clinical judgment that the patient will require inpatient hospital care spanning beyond 2 midnights from the point of admission due to high intensity of service, high risk for further deterioration and high frequency of surveillance required.*  Author: Rolla Plate, DO 09/09/2022 9:05 PM  For on call review [email or web address].

## 2022-09-09 NOTE — ED Provider Notes (Signed)
[facility] Provider Note   CSN: [id number] Arrival date & time: 09/09/22  1723     History  Chief Complaint  Patient presents with   [name] is a 67 y.o. male.   Fall     Patient presents to the emergency department due to fall.  He did hit his head, did not lose consciousness.  The pain is mostly to the right hip.  History of dialysis, had an earlier today full session without any missed doses.  No vision changes, normally is able to ambulate.  Home Medications Prior to Admission medications   Medication Sig Start Date End Date Taking? Authorizing Provider  acetaminophen (TYLENOL) 325 MG tablet Take 2 tablets (650 mg total) by mouth every 6 (six) hours as needed for mild pain, moderate pain or fever. 08/02/21  Yes Allie Bossier, MD  albuterol (VENTOLIN HFA) 108 (90 Base) MCG/ACT inhaler INHALE TWO PUFFS BY MOUTH EVERY 6 HOURS AS NEEDED Patient taking differently: Inhale 2 puffs into the lungs every 6 (six) hours as needed for wheezing or shortness of breath. 08/11/22  Yes [name], [name], MD  amiodarone (PACERONE) 200 MG tablet Take 1 tablet (200 mg total) by mouth 2 (two) times daily. Through 09/08/22.  Then 1 tab (200 mg) once daily starting 09/09/22 Patient taking differently: Take 200 mg by mouth daily. Through 09/08/22.  Then 1 tab (200 mg) once daily starting 09/09/22 08/21/22  Yes Tat, Shanon Brow, MD  aspirin EC 325 MG tablet Take 1 tablet (325 mg total) by mouth daily with breakfast. 06/23/22  Yes Emokpae, Courage, MD  calcium acetate (PHOSLO) 667 MG capsule Take 667 mg by mouth in the morning, at noon, and at bedtime.   Yes [provider]  doxazosin (CARDURA) 2 MG tablet TAKE 1 TABLET BY MOUTH DAILY 07/29/22  Yes [name], Scott A, MD  EPINEPHrine 0.3 mg/0.3 mL IJ SOAJ injection Inject 0.3 mg into the muscle as needed for anaphylaxis. 01/25/21  Yes [name], [name], MD  Evolocumab (REPATHA SURECLICK) [id number] MG/ML SOAJ  Inject 140 mg into the skin every 14 (fourteen) days. 06/29/22  Yes Loel Dubonnet, NP  hydrALAZINE (APRESOLINE) 50 MG tablet Take 1 tablet (50 mg total) by mouth 3 (three) times daily. 06/23/22  Yes Emokpae, Courage, MD  LANTUS 100 UNIT/ML injection 8 mLs daily. Sliding scale 04/07/22  Yes [provider]  metoprolol succinate (TOPROL-XL) 50 MG 24 hr tablet Take 1 tablet (50 mg total) by mouth daily. 08/02/22  Yes Loel Dubonnet, NP  nitroGLYCERIN (NITROSTAT) 0.4 MG SL tablet Place 1 tablet (0.4 mg total) under the tongue every 5 (five) minutes as needed for chest pain. 09/14/21  Yes Loel Dubonnet, NP  NOVOLOG FLEXPEN 100 UNIT/ML FlexPen Inject 4-10 Units into the skin 3 (three) times daily with meals. 07/07/22  Yes [provider]  ondansetron (ZOFRAN-ODT) 4 MG disintegrating tablet Take 1 tablet by mouth every 8 hours as needed for nausea and vomiting. 08/02/22  Yes Loel Dubonnet, NP  pantoprazole (PROTONIX) 40 MG tablet Take 1 tablet (40 mg total) by mouth 2 (two) times daily. 05/30/22  Yes Tat, Shanon Brow, MD  sertraline (ZOLOFT) 100 MG tablet TAKE 1 TABLET BY MOUTH DAILY 07/29/22  Yes [name], MD      Allergies    Bee venom, Atorvastatin, Diltiazem, Lopressor [metoprolol], Reglan [metoclopramide], Rosuvastatin, and Valsartan    Review of Systems   Review  of Systems  Physical Exam Updated Vital Signs BP (!) 146/72 (BP Location: Right Arm)   Pulse 72   Temp 98.1 F (36.7 C) (Oral)   Ht 5\' 10"  (1.778 m)   Wt 81.6 kg   SpO2 95%   BMI 25.83 kg/m  Physical Exam Vitals and nursing note reviewed. Exam conducted with a chaperone present.  Constitutional:      Appearance: Normal appearance.  HENT:     Head: Normocephalic and atraumatic.  Eyes:     General: No scleral icterus.       Right eye: No discharge.        Left eye: No discharge.     Extraocular Movements: Extraocular movements intact.     Pupils: Pupils are equal, round, and reactive to light.   Cardiovascular:     Rate and Rhythm: Normal rate and regular rhythm.     Pulses: Normal pulses.     Heart sounds: Normal heart sounds. No murmur heard.    No friction rub. No gallop.  Pulmonary:     Effort: Pulmonary effort is normal. No respiratory distress.     Breath sounds: Normal breath sounds.  Abdominal:     General: Abdomen is flat. Bowel sounds are normal. There is no distension.     Palpations: Abdomen is soft.     Tenderness: There is no abdominal tenderness.  Musculoskeletal:        General: Tenderness present.  Skin:    General: Skin is warm and dry.     Coloration: Skin is not jaundiced.  Neurological:     Mental Status: He is alert. Mental status is at baseline.     Coordination: Coordination normal.     ED Results / Procedures / Treatments   Labs (all labs ordered are listed, but only abnormal results are displayed) Labs Reviewed  BASIC METABOLIC PANEL - Abnormal; Notable for the following components:      Result Value   Sodium 134 (*)    Potassium 3.3 (*)    Chloride 95 (*)    Glucose, Bld 240 (*)    Creatinine, Ser 2.63 (*)    Calcium 8.3 (*)    GFR, Estimated 26 (*)    All other components within normal limits  CBC WITH DIFFERENTIAL/PLATELET - Abnormal; Notable for the following components:   RBC 3.85 (*)    Hemoglobin 11.2 (*)    HCT 35.5 (*)    RDW 19.2 (*)    Lymphs Abs 0.6 (*)    All other components within normal limits    EKG None  Radiology CT Head Wo Contrast  Result Date: 09/09/2022 CLINICAL DATA:  Trauma, fall EXAM: CT HEAD WITHOUT CONTRAST TECHNIQUE: Contiguous axial images were obtained from the base of the skull through the vertex without intravenous contrast. RADIATION DOSE REDUCTION: This exam was performed according to the departmental dose-optimization program which includes automated exposure control, adjustment of the mA and/or kV according to patient size and/or use of iterative reconstruction technique. COMPARISON:   03/03/2022 FINDINGS: Brain: No acute intracranial findings are seen. Ventricles are nondilated. Cortical sulci are prominent. There is decreased density in periventricular and subcortical white matter. Vascular: Scattered arterial calcifications are seen. Skull: No fracture is seen in calvarium. Sinuses/Orbits: Unremarkable. Other: None. IMPRESSION: No acute intracranial findings are seen in noncontrast CT brain. Atrophy. Small-vessel disease. Electronically Signed   By: Elmer Picker M.D.   On: 09/09/2022 19:45   DG Knee 1-2 Views Right  Result Date: 09/09/2022  CLINICAL DATA:  Fall at home with pain to the right leg EXAM: RIGHT KNEE - 2 VIEW COMPARISON:  None Available. FINDINGS: No evidence of fracture or dislocation. Likely joint effusion, although assessment is technically challenging in the absence of true lateral view. No evidence of arthropathy or other focal bone abnormality. Soft tissues are unremarkable. IMPRESSION: No evidence of fracture or dislocation. Likely joint effusion, although assessment is technically challenging in the absence of true lateral view. Electronically Signed   By: Darrin Nipper M.D.   On: 09/09/2022 18:45   DG Hip Unilat W or Wo Pelvis 2-3 Views Right  Result Date: 09/09/2022 CLINICAL DATA:  Pain after fall EXAM: DG HIP (WITH OR WITHOUT PELVIS) 3V RIGHT COMPARISON:  None Available. FINDINGS: Comminuted foreshortened intertrochanteric right hip fracture identified. Mild displaced fragments. Moderate joint space loss of the hips bilaterally. Osteopenia. Hyperostosis. Scattered vascular calcifications. Sclerosis along the right supra-acetabular region. Possible bone islands. Please correlate for any known history. This is stable going back to abdomen and pelvis CT of 03/03/2022. IMPRESSION: Comminuted intertrochanteric fracture of the right hip with mildly displaced fragments. Some foreshortening. Electronically Signed   By: Jill Side M.D.   On: 09/09/2022 18:05     Procedures Procedures    Medications Ordered in ED Medications  HYDROmorphone (DILAUDID) injection 1 mg (1 mg Intravenous Given 09/09/22 1826)  morphine (PF) 4 MG/ML injection 4 mg (4 mg Intravenous Given 09/09/22 2007)    ED Course/ Medical Decision Making/ A&P Clinical Course as of 09/09/22 2014  Fri Sep 09, 2022  1924 Consulted Dr. Amedeo Kinsman with orthopedic surgery.  Any pain, medically very complex not a good candidate to stay in any pain hospital.  I will consult hospitalist here and get him admitted to [facility]. [HS]  1925 I spoke with Dr. Mardelle Matte at [facility], ED on-call for orthopedics, he is aware of the patient.  I will admit to the hospitalist service. [HS]    Clinical Course User Index [HS] Sherrill Raring, PA-C                             Medical Decision Making Amount and/or Complexity of Data Reviewed Labs: ordered. Radiology: ordered.  Risk Prescription drug management. Decision regarding hospitalization.   Patient presents to the emergency department due to fall.  He is neurovascular intact, he has palpable pulses.  He has obvious shortening to right lower extremity, pain to the right hip as well as the knee.  Neuroexam is reassuring.  Will get plain films, CT head given fall and had dialysis earlier today she was anticoagulated. Imaging studies reassuring except patient does have a comminuted intertrochanteric fracture.  I consulted orthopedic surgery as documented ED course both at [facility] and here at any time.  Patient will need to be admitted and transferred to [facility] for surgical management and management of medical comorbidities.  I consult hospitalist who agrees with admission.  I discussed with patient's sister Dr. Sheran Fava who is aware of the plan.        Final Clinical Impression(s) / ED Diagnoses Final diagnoses:  Closed displaced intertrochanteric fracture of left femur, initial encounter [facility])    Rx / [place] Orders ED Discharge Orders     None          Sherrill Raring, Hershal Coria 09/09/22 2110    Milton [name], MD 09/10/22 1057

## 2022-09-09 NOTE — Assessment & Plan Note (Signed)
-   Continue amiodarone - Continue to monitor

## 2022-09-09 NOTE — Assessment & Plan Note (Addendum)
-   Mechanical fall - X-ray shows comminuted intertrochanteric fracture of right hip with mildly displaced fragments - Ortho consulted, Dr. Mardelle Matte expecting patient at [facility] pain [facility] - N.p.o. after midnight in case he should decide to do surgery tomorrow - Continue to monitor

## 2022-09-09 NOTE — Assessment & Plan Note (Signed)
-   Potassium 3.3 - Defer to nephrology - Continue to monitor

## 2022-09-09 NOTE — ED Triage Notes (Signed)
Dialysis this morning. Fall at home. Didn't check blood sugar PTA. 2L O2 at home. Pain is 8/10 in R hip into R leg. No other pain. Hit head as well but denies pain in head. Has not been able to stand since fall.

## 2022-09-09 NOTE — ED Notes (Signed)
Pt returned from CT. Pain has increased again.

## 2022-09-09 NOTE — Assessment & Plan Note (Signed)
-   Monday Wednesday Friday dialysis - Last dialyzed today - Dr. Joylene Grapes notified of patient and that he will be transferring to [facility]

## 2022-09-10 ENCOUNTER — Inpatient Hospital Stay (HOSPITAL_COMMUNITY): Payer: Medicare HMO | Admitting: Anesthesiology

## 2022-09-10 ENCOUNTER — Inpatient Hospital Stay (HOSPITAL_COMMUNITY): Payer: Medicare HMO

## 2022-09-10 ENCOUNTER — Encounter (HOSPITAL_COMMUNITY): Payer: Self-pay | Admitting: Family Medicine

## 2022-09-10 ENCOUNTER — Encounter (HOSPITAL_COMMUNITY)
Admission: EM | Disposition: A | Discharge status: Skilled Nursing Facility | Payer: Self-pay | Source: Home / Self Care | Attending: Internal Medicine

## 2022-09-10 DIAGNOSIS — I2510 Atherosclerotic heart disease of native coronary artery without angina pectoris: Secondary | ICD-10-CM | POA: Diagnosis not present

## 2022-09-10 DIAGNOSIS — Z794 Long term (current) use of insulin: Secondary | ICD-10-CM

## 2022-09-10 DIAGNOSIS — S72141A Displaced intertrochanteric fracture of right femur, initial encounter for closed fracture: Secondary | ICD-10-CM | POA: Diagnosis not present

## 2022-09-10 DIAGNOSIS — N186 End stage renal disease: Secondary | ICD-10-CM

## 2022-09-10 DIAGNOSIS — Z992 Dependence on renal dialysis: Secondary | ICD-10-CM

## 2022-09-10 DIAGNOSIS — D631 Anemia in chronic kidney disease: Secondary | ICD-10-CM

## 2022-09-10 DIAGNOSIS — I120 Hypertensive chronic kidney disease with stage 5 chronic kidney disease or end stage renal disease: Secondary | ICD-10-CM

## 2022-09-10 DIAGNOSIS — E1122 Type 2 diabetes mellitus with diabetic chronic kidney disease: Secondary | ICD-10-CM

## 2022-09-10 HISTORY — PX: INTRAMEDULLARY (IM) NAIL INTERTROCHANTERIC: SHX5875

## 2022-09-10 LAB — GLUCOSE, CAPILLARY
Glucose-Capillary: 148 mg/dL — ABNORMAL HIGH (ref 70–99)
Glucose-Capillary: 139 mg/dL — ABNORMAL HIGH (ref 70–99)
Glucose-Capillary: 154 mg/dL — ABNORMAL HIGH (ref 70–99)
Glucose-Capillary: 188 mg/dL — ABNORMAL HIGH (ref 70–99)
Glucose-Capillary: 202 mg/dL — ABNORMAL HIGH (ref 70–99)
Glucose-Capillary: 245 mg/dL — ABNORMAL HIGH (ref 70–99)

## 2022-09-10 LAB — COMPREHENSIVE METABOLIC PANEL
ALT: 9 U/L (ref 0–44)
AST: 15 U/L (ref 15–41)
Albumin: 3 g/dL — ABNORMAL LOW (ref 3.5–5.0)
Alkaline Phosphatase: 88 U/L (ref 38–126)
Anion gap: 10 (ref 5–15)
BUN: 24 mg/dL — ABNORMAL HIGH (ref 8–23)
CO2: 29 mmol/L (ref 22–32)
Calcium: 8.3 mg/dL — ABNORMAL LOW (ref 8.9–10.3)
Chloride: 97 mmol/L — ABNORMAL LOW (ref 98–111)
Creatinine, Ser: 3.56 mg/dL — ABNORMAL HIGH (ref 0.61–1.24)
GFR, Estimated: 18 mL/min — ABNORMAL LOW (ref >60)
Glucose, Bld: 159 mg/dL — ABNORMAL HIGH (ref 70–99)
Potassium: 4.1 mmol/L (ref 3.5–5.1)
Sodium: 136 mmol/L (ref 135–145)
Total Bilirubin: 0.9 mg/dL (ref 0.3–1.2)
Total Protein: 6.1 g/dL — ABNORMAL LOW (ref 6.5–8.1)

## 2022-09-10 LAB — CBC WITH DIFFERENTIAL/PLATELET
Abs Immature Granulocytes: 0.03 10*3/uL (ref 0.00–0.07)
Basophils Absolute: 0.1 10*3/uL (ref 0.0–0.1)
Basophils Relative: 1 %
Eosinophils Absolute: 0.2 10*3/uL (ref 0.0–0.5)
Eosinophils Relative: 2 %
HCT: 35.7 % — ABNORMAL LOW (ref 39.0–52.0)
Hemoglobin: 10.9 g/dL — ABNORMAL LOW (ref 13.0–17.0)
Immature Granulocytes: 0 %
Lymphocytes Relative: 12 %
Lymphs Abs: 0.9 10*3/uL (ref 0.7–4.0)
MCH: 28.5 pg (ref 26.0–34.0)
MCHC: 30.5 g/dL (ref 30.0–36.0)
MCV: 93.2 fL (ref 80.0–100.0)
Monocytes Absolute: 1 10*3/uL (ref 0.1–1.0)
Monocytes Relative: 13 %
Neutro Abs: 5.3 10*3/uL (ref 1.7–7.7)
Neutrophils Relative %: 72 %
Platelets: 200 10*3/uL (ref 150–400)
RBC: 3.83 MIL/uL — ABNORMAL LOW (ref 4.22–5.81)
RDW: 19.3 % — ABNORMAL HIGH (ref 11.5–15.5)
WBC: 7.3 10*3/uL (ref 4.0–10.5)
nRBC: 0 % (ref 0.0–0.2)

## 2022-09-10 LAB — MAGNESIUM: Magnesium: 3 mg/dL — ABNORMAL HIGH (ref 1.7–2.4)

## 2022-09-10 LAB — SURGICAL PCR SCREEN
MRSA, PCR: NEGATIVE
Staphylococcus aureus: NEGATIVE

## 2022-09-10 SURGERY — FIXATION, FRACTURE, INTERTROCHANTERIC, WITH INTRAMEDULLARY ROD
Anesthesia: General | Laterality: Right

## 2022-09-10 MED ORDER — ONDANSETRON HCL 4 MG/2ML IJ SOLN
INTRAMUSCULAR | Status: AC
  Filled 2022-09-10: qty 2

## 2022-09-10 MED ORDER — ROCURONIUM BROMIDE 10 MG/ML (PF) SYRINGE
PREFILLED_SYRINGE | INTRAVENOUS | Status: AC
  Filled 2022-09-10: qty 10

## 2022-09-10 MED ORDER — DEXAMETHASONE SODIUM PHOSPHATE 10 MG/ML IJ SOLN
INTRAMUSCULAR | Status: AC
  Filled 2022-09-10: qty 2

## 2022-09-10 MED ORDER — CEFAZOLIN SODIUM-DEXTROSE 2-4 GM/100ML-% IV SOLN: 2.0000 g | Freq: Three times a day (TID) | INTRAVENOUS | Status: DC

## 2022-09-10 MED ORDER — TRANEXAMIC ACID-NACL 1000-0.7 MG/100ML-% IV SOLN
INTRAVENOUS | Status: AC
  Filled 2022-09-10: qty 100

## 2022-09-10 MED ORDER — ALBUMIN HUMAN 5 % IV SOLN
INTRAVENOUS | Status: AC
  Filled 2022-09-10: qty 250

## 2022-09-10 MED ORDER — ALBUMIN HUMAN 5 % IV SOLN: 12.5000 g | Freq: Once | INTRAVENOUS | Status: DC

## 2022-09-10 MED ORDER — ENOXAPARIN SODIUM 40 MG/0.4ML IJ SOSY
40.0000 mg | PREFILLED_SYRINGE | INTRAMUSCULAR | Status: DC
  Administered 2022-09-11: 40 mg via SUBCUTANEOUS
  Filled 2022-09-10: qty 0.4

## 2022-09-10 MED ORDER — CEFAZOLIN SODIUM-DEXTROSE 2-4 GM/100ML-% IV SOLN
INTRAVENOUS | Status: AC
  Filled 2022-09-10: qty 100

## 2022-09-10 MED ORDER — LIDOCAINE 2% (20 MG/ML) 5 ML SYRINGE
INTRAMUSCULAR | Status: AC
  Filled 2022-09-10: qty 10

## 2022-09-10 MED ORDER — ACETAMINOPHEN 10 MG/ML IV SOLN
INTRAVENOUS | Status: AC
  Filled 2022-09-10: qty 100

## 2022-09-10 MED ORDER — 0.9 % SODIUM CHLORIDE (POUR BTL) OPTIME
TOPICAL | Status: DC | PRN
  Administered 2022-09-10: 1000 mL

## 2022-09-10 MED ORDER — OXYCODONE HCL 5 MG PO TABS: 5.0000 mg | ORAL_TABLET | Freq: Once | ORAL | Status: DC | PRN

## 2022-09-10 MED ORDER — BUPIVACAINE HCL (PF) 0.5 % IJ SOLN
INTRAMUSCULAR | Status: DC | PRN
  Administered 2022-09-10: 30 mL

## 2022-09-10 MED ORDER — ROCURONIUM BROMIDE 10 MG/ML (PF) SYRINGE
PREFILLED_SYRINGE | INTRAVENOUS | Status: DC | PRN
  Administered 2022-09-10: 50 mg via INTRAVENOUS

## 2022-09-10 MED ORDER — ALBUMIN HUMAN 5 % IV SOLN: INTRAVENOUS | Status: DC | PRN

## 2022-09-10 MED ORDER — CHLORHEXIDINE GLUCONATE 0.12 % MT SOLN: 15.0000 mL | Freq: Once | OROMUCOSAL | Status: AC

## 2022-09-10 MED ORDER — HYDROMORPHONE HCL 1 MG/ML IJ SOLN
0.2500 mg | INTRAMUSCULAR | Status: DC | PRN
  Administered 2022-09-10: 0.5 mg via INTRAVENOUS

## 2022-09-10 MED ORDER — PHENYLEPHRINE 80 MCG/ML (10ML) SYRINGE FOR IV PUSH (FOR BLOOD PRESSURE SUPPORT)
PREFILLED_SYRINGE | INTRAVENOUS | Status: AC
  Filled 2022-09-10: qty 20

## 2022-09-10 MED ORDER — POLYETHYLENE GLYCOL 3350 17 G PO PACK
17.0000 g | PACK | Freq: Every day | ORAL | Status: DC | PRN
  Administered 2022-09-13 – 2022-09-14 (×2): 17 g via ORAL
  Filled 2022-09-10 (×2): qty 1

## 2022-09-10 MED ORDER — MIDAZOLAM HCL 2 MG/2ML IJ SOLN
INTRAMUSCULAR | Status: AC
  Filled 2022-09-10: qty 2

## 2022-09-10 MED ORDER — FENTANYL CITRATE (PF) 250 MCG/5ML IJ SOLN
INTRAMUSCULAR | Status: DC | PRN
  Administered 2022-09-10: 25 ug via INTRAVENOUS

## 2022-09-10 MED ORDER — INSULIN ASPART 100 UNIT/ML IJ SOLN: 0.0000 [IU] | INTRAMUSCULAR | Status: DC | PRN

## 2022-09-10 MED ORDER — PROMETHAZINE HCL 25 MG/ML IJ SOLN: 6.2500 mg | INTRAMUSCULAR | Status: DC | PRN

## 2022-09-10 MED ORDER — PROPOFOL 10 MG/ML IV BOLUS
INTRAVENOUS | Status: AC
  Filled 2022-09-10: qty 20

## 2022-09-10 MED ORDER — PROPOFOL 10 MG/ML IV BOLUS
INTRAVENOUS | Status: DC | PRN
  Administered 2022-09-10: 50 mg via INTRAVENOUS

## 2022-09-10 MED ORDER — HYDROMORPHONE HCL 1 MG/ML IJ SOLN
INTRAMUSCULAR | Status: AC
  Filled 2022-09-10: qty 1

## 2022-09-10 MED ORDER — SUGAMMADEX SODIUM 200 MG/2ML IV SOLN
INTRAVENOUS | Status: DC | PRN
  Administered 2022-09-10: 200 mg via INTRAVENOUS

## 2022-09-10 MED ORDER — CHLORHEXIDINE GLUCONATE 0.12 % MT SOLN
OROMUCOSAL | Status: AC
  Administered 2022-09-10: 15 mL via OROMUCOSAL
  Filled 2022-09-10: qty 15

## 2022-09-10 MED ORDER — ONDANSETRON HCL 4 MG/2ML IJ SOLN
INTRAMUSCULAR | Status: DC | PRN
  Administered 2022-09-10: 4 mg via INTRAVENOUS

## 2022-09-10 MED ORDER — PHENYLEPHRINE 80 MCG/ML (10ML) SYRINGE FOR IV PUSH (FOR BLOOD PRESSURE SUPPORT)
PREFILLED_SYRINGE | INTRAVENOUS | Status: DC | PRN
  Administered 2022-09-10: 160 ug via INTRAVENOUS
  Administered 2022-09-10: 80 ug via INTRAVENOUS

## 2022-09-10 MED ORDER — SENNOSIDES-DOCUSATE SODIUM 8.6-50 MG PO TABS
1.0000 | ORAL_TABLET | Freq: Every day | ORAL | Status: DC
  Administered 2022-09-10 – 2022-09-16 (×7): 1 via ORAL
  Filled 2022-09-10 (×7): qty 1

## 2022-09-10 MED ORDER — POTASSIUM CHLORIDE CRYS ER 20 MEQ PO TBCR
40.0000 meq | EXTENDED_RELEASE_TABLET | Freq: Once | ORAL | Status: AC
  Administered 2022-09-10: 40 meq via ORAL
  Filled 2022-09-10: qty 2

## 2022-09-10 MED ORDER — MEPERIDINE HCL 25 MG/ML IJ SOLN: 6.2500 mg | INTRAMUSCULAR | Status: DC | PRN

## 2022-09-10 MED ORDER — MIDAZOLAM HCL 2 MG/2ML IJ SOLN: 0.5000 mg | Freq: Once | INTRAMUSCULAR | Status: DC | PRN

## 2022-09-10 MED ORDER — LIDOCAINE 2% (20 MG/ML) 5 ML SYRINGE
INTRAMUSCULAR | Status: DC | PRN
  Administered 2022-09-10: 20 mg via INTRAVENOUS

## 2022-09-10 MED ORDER — OXYCODONE HCL 5 MG/5ML PO SOLN: 5.0000 mg | Freq: Once | ORAL | Status: DC | PRN

## 2022-09-10 MED ORDER — CEFAZOLIN SODIUM-DEXTROSE 2-4 GM/100ML-% IV SOLN
2.0000 g | Freq: Two times a day (BID) | INTRAVENOUS | Status: AC
  Administered 2022-09-10 – 2022-09-11 (×2): 2 g via INTRAVENOUS
  Filled 2022-09-10 (×3): qty 100

## 2022-09-10 MED ORDER — MIDAZOLAM HCL 2 MG/2ML IJ SOLN
INTRAMUSCULAR | Status: DC | PRN
  Administered 2022-09-10: 1 mg via INTRAVENOUS

## 2022-09-10 MED ORDER — DEXAMETHASONE SODIUM PHOSPHATE 10 MG/ML IJ SOLN
INTRAMUSCULAR | Status: DC | PRN
  Administered 2022-09-10: 4 mg via INTRAVENOUS

## 2022-09-10 MED ORDER — OXYCODONE HCL 5 MG PO TABS
5.0000 mg | ORAL_TABLET | ORAL | Status: DC | PRN
  Administered 2022-09-10 – 2022-09-11 (×2): 5 mg via ORAL
  Administered 2022-09-12 – 2022-09-17 (×6): 10 mg via ORAL
  Filled 2022-09-10 (×5): qty 2
  Filled 2022-09-10 (×2): qty 1
  Filled 2022-09-10 (×2): qty 2

## 2022-09-10 MED ORDER — FENTANYL CITRATE (PF) 250 MCG/5ML IJ SOLN
INTRAMUSCULAR | Status: AC
  Filled 2022-09-10: qty 5

## 2022-09-10 MED ORDER — EPHEDRINE 5 MG/ML INJ
INTRAVENOUS | Status: AC
  Filled 2022-09-10: qty 5

## 2022-09-10 MED ORDER — PHENYLEPHRINE HCL-NACL 20-0.9 MG/250ML-% IV SOLN
INTRAVENOUS | Status: DC | PRN
  Administered 2022-09-10: 50 ug/min via INTRAVENOUS

## 2022-09-10 MED ORDER — BUPIVACAINE HCL (PF) 0.5 % IJ SOLN
INTRAMUSCULAR | Status: AC
  Filled 2022-09-10: qty 30

## 2022-09-10 MED ORDER — ACETAMINOPHEN 10 MG/ML IV SOLN
INTRAVENOUS | Status: DC | PRN
  Administered 2022-09-10: 1000 mg via INTRAVENOUS

## 2022-09-10 MED ORDER — PROSOURCE PLUS PO LIQD
30.0000 mL | Freq: Two times a day (BID) | ORAL | Status: DC
  Administered 2022-09-10 – 2022-09-17 (×12): 30 mL via ORAL
  Filled 2022-09-10 (×11): qty 30

## 2022-09-10 MED ORDER — GLYCOPYRROLATE PF 0.2 MG/ML IJ SOSY
PREFILLED_SYRINGE | INTRAMUSCULAR | Status: AC
  Filled 2022-09-10: qty 1

## 2022-09-10 MED ORDER — ORAL CARE MOUTH RINSE: 15.0000 mL | Freq: Once | OROMUCOSAL | Status: AC

## 2022-09-10 MED ORDER — SODIUM CHLORIDE 0.9 % IV SOLN: INTRAVENOUS | Status: DC

## 2022-09-10 SURGICAL SUPPLY — 39 items
BAG COUNTER SPONGE SURGICOUNT (BAG) ×1 IMPLANT
BIT DRILL INTERTAN LAG SCREW (BIT) IMPLANT
BIT DRILL SHORT 4.0 (BIT) IMPLANT
CHLORAPREP W/TINT 26 (MISCELLANEOUS) ×1 IMPLANT
COVER PERINEAL POST (MISCELLANEOUS) ×1 IMPLANT
COVER SURGICAL LIGHT HANDLE (MISCELLANEOUS) ×1 IMPLANT
DERMABOND ADVANCED .7 DNX6 (GAUZE/BANDAGES/DRESSINGS) ×1 IMPLANT
DRAPE C-ARM 42X72 X-RAY (DRAPES) ×1 IMPLANT
DRAPE C-ARMOR (DRAPES) ×1 IMPLANT
DRAPE STERI IOBAN 125X83 (DRAPES) ×1 IMPLANT
DRAPE U-SHAPE 47X51 STRL (DRAPES) ×2 IMPLANT
DRILL BIT SHORT 4.0 (BIT) ×2
DRSG TEGADERM 4X4.5 CHG (GAUZE/BANDAGES/DRESSINGS) IMPLANT
DRSG TEGADERM 4X4.75 (GAUZE/BANDAGES/DRESSINGS) ×3 IMPLANT
ELECT REM PT RETURN 15FT ADLT (MISCELLANEOUS) IMPLANT
GAUZE SPONGE 4X4 12PLY STRL (GAUZE/BANDAGES/DRESSINGS) IMPLANT
GAUZE SPONGE 4X4 16PLY NS LF (WOUND CARE) ×1 IMPLANT
GLOVE BIOGEL PI IND STRL 8 (GLOVE) ×1 IMPLANT
GLOVE SURG ORTHO 8.0 STRL STRW (GLOVE) ×2 IMPLANT
GOWN STRL REUS W/TWL XL LVL3 (GOWN DISPOSABLE) ×3 IMPLANT
GUIDE PIN 3.2X343 (PIN) ×2
GUIDE PIN 3.2X343MM (PIN) ×2
GUIDE ROD 3.0 (MISCELLANEOUS) ×1
KIT BASIN OR (CUSTOM PROCEDURE TRAY) ×1 IMPLANT
KIT TURNOVER KIT A (KITS) IMPLANT
MANIFOLD NEPTUNE II (INSTRUMENTS) ×1 IMPLANT
NAIL IM CANN 11.5X380 130D (Nail) IMPLANT
NS IRRIG 1000ML POUR BTL (IV SOLUTION) ×1 IMPLANT
PACK GENERAL/GYN (CUSTOM PROCEDURE TRAY) ×1 IMPLANT
PAD ARMBOARD 7.5X6 YLW CONV (MISCELLANEOUS) ×1 IMPLANT
PIN GUIDE 3.2X343MM (PIN) IMPLANT
ROD GUIDE 3.0 (MISCELLANEOUS) IMPLANT
SCREW LAG COMPR KIT 95/90 (Screw) IMPLANT
SCREW TRIGEN LOW PROF 5.0X42.5 (Screw) IMPLANT
SUT MNCRL AB 3-0 PS2 18 (SUTURE) ×1 IMPLANT
SUT VIC AB 0 CT1 27 (SUTURE) ×1
SUT VIC AB 0 CT1 27XBRD ANBCTR (SUTURE) ×1 IMPLANT
SUT VIC AB 2-0 CT2 27 (SUTURE) ×1 IMPLANT
TOWEL OR NON WOVEN STRL DISP B (DISPOSABLE) ×1 IMPLANT

## 2022-09-10 NOTE — Progress Notes (Signed)
Patient arrived to the room with a bag of personal belongings. Patient was hooked up to 2L [place] on the wall source. Patient is alert and oriented x4, reported R hip pain 3/10. VS stable. Sister Amy was called and notified of patient's arrival and wish to be full code.  Patient was oriented to the room, staffs, and call bell. Bed brake in place.    Lucion Dilger

## 2022-09-10 NOTE — Anesthesia Preprocedure Evaluation (Addendum)
Anesthesia Evaluation  Patient identified by MRN, date of birth, ID band Patient awake    Reviewed: Allergy & Precautions, NPO status , Patient's Chart, lab work & pertinent test results, reviewed documented beta blocker date and time   History of Anesthesia Complications Negative for: history of anesthetic complications  Airway Mallampati: I  TM Distance: >3 FB Neck ROM: Full    Dental  (+) Missing, Dental Advisory Given   Pulmonary sleep apnea (no longer uses CPAP) and Oxygen sleep apnea , COPD,  COPD inhaler, former smoker   breath sounds clear to auscultation       Cardiovascular hypertension, Pt. on medications and Pt. on home beta blockers (-) angina + CAD, + Past MI, + CABG and + Peripheral Vascular Disease  + dysrhythmias Atrial Fibrillation  Rhythm:Irregular Rate:Normal  08/18/2022 ECHO: EF 50%, mod LVH, normal LVF with Grade 2 DD, normal RVF, LA dilation, RA dilation, trivial MR, mild TR  '23 Cath: Severe 3 vessel obstructive CAD Patent LIMA to the LAD Patent SVG to large second diagonal Patent SVG to OM Patent SVG to PL branch of the RCA    Neuro/Psych    Depression    CVA, No Residual Symptoms    GI/Hepatic Neg liver ROS,GERD  Medicated and Controlled,, esophageal adenocarcinoma    Endo/Other  diabetes (glu 148), Insulin Dependent    Renal/GU 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, MWF)     Musculoskeletal   Abdominal   Peds  Hematology  (+) Blood dyscrasia (Hb 10.9), anemia   Anesthesia Other Findings   Reproductive/Obstetrics                             Anesthesia Physical Anesthesia Plan  ASA: 4  Anesthesia Plan: General   Post-op Pain Management: Tylenol PO (pre-op)*   Induction: Intravenous  PONV Risk Score and Plan: 2 and Ondansetron and Dexamethasone  Airway Management Planned: Oral ETT  Additional Equipment: None  Intra-op Plan:   Post-operative Plan:  Extubation in OR  Informed Consent: I have reviewed the patients History and Physical, chart, labs and discussed the procedure including the risks, benefits and alternatives for the proposed anesthesia with the patient or authorized representative who has indicated his/her understanding and acceptance.     Dental advisory given  Plan Discussed with: CRNA and Surgeon  Anesthesia Plan Comments:         Anesthesia Quick Evaluation

## 2022-09-10 NOTE — Consult Note (Signed)
ORTHOPAEDIC CONSULTATION  REQUESTING PHYSICIAN: Kayleen Memos, DO  Chief Complaint: Right intertrochanteric femur fracture  HPI: [name] is a 67 y.o. male who has a history significant for CHF, coronary disease, diabetes, ESRD on dialysis who sustained a fall yesterday resulting in a femoral intertrochanteric fracture.   Localizes pain to the right hip.  He denies any pain before the surgery.  He denies distal numbness and tingling.  He denies pain of the joints or extremities.  Past Medical History:  Diagnosis Date   Anemia    Arthritis    hips shoulders   CAD (coronary artery disease) 03/03/2019   Cancer ([place])    Esophageal cancer 2022   CHF (congestive heart failure) ([place]) 2020   Chronic combined systolic and diastolic heart failure ([place]) 04/26/2018   Admitted 11/14-11/20/19-diuresed 10L   Depression 05/31/2021   Fatigue 10/14/2020   Hypertension    Myocardial infarction [facility])    Sleep apnea    uses a bipap machine   Stroke ([place]) 12/14/2020   no last weakness or paralysis   Type 2 diabetes mellitus with diabetic nephropathy ([place]) 04/23/2019   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   Past Surgical History:  Procedure Laterality Date   AV FISTULA PLACEMENT Left 12/21/2021   Procedure: LEFT ARM ARTERIOVENOUS (AV) FISTULA CREATION;  Surgeon: Rosetta Posner, MD;  [facility]: [facility];  Service: Vascular;  Laterality: Left;   BIOPSY  06/27/2019   Procedure: BIOPSY;  Surgeon: Rogene Houston, MD;  [facility];  Service: Endoscopy;;  ascending colon polyp   BIOPSY  10/21/2020   Procedure: BIOPSY;  Surgeon: Rogene Houston, MD;  [facility];  Service: Endoscopy;;  esophageal,gastric polyp   BIOPSY  02/24/2021   Procedure: BIOPSY;  Surgeon: Rogene Houston, MD;  [facility];  Service: Endoscopy;;  distal and proximal esophageal biopsies    BIOPSY  05/30/2022   Procedure: BIOPSY;  Surgeon: Eloise Harman, DO;  [facility];   Service: Endoscopy;;   CARDIAC SURGERY     CATARACT EXTRACTION     COLONOSCOPY N/A 06/27/2019   [name]:Diverticulosis in the entire examined colon. tubular adenoma in ascending colon, 7mm tubular adenoma in prox sigmoid. external hemorrhoids   CORONARY ARTERY BYPASS GRAFT N/A 03/25/2019   Procedure: CORONARY ARTERY BYPASS GRAFTING (CABG) X  4 USING LEFT INTERNAL MAMMARY ARTERY AND RIGHT SAPHENOUS VEIN GRAFTS;  Surgeon: [name], MD;  Location: [place];  Service: Open Heart Surgery;  Laterality: N/A;   ESOPHAGEAL BRUSHING  03/08/2022   Procedure: ESOPHAGEAL BRUSHING;  Surgeon: Eloise Harman, DO;  [facility];  Service: Endoscopy;;   ESOPHAGOGASTRODUODENOSCOPY (EGD) WITH PROPOFOL N/A 10/21/2020   rehman:Normal hypopharynx.Normal proximal esophagus and mid esophagus.Esophageal mucosal changes consistent with long-segment Barrett's esophagus. (Focal low-grade dysplasia and atypia proximally) z line irregular 37 cm from incisors, 3 cm HH, single gastric polyp (fundic gland) normal duodenal bulb/second portion of duodenum, proximal margin of Barrett's at 34 cm   ESOPHAGOGASTRODUODENOSCOPY (EGD) WITH PROPOFOL N/A 02/24/2021   Procedure: ESOPHAGOGASTRODUODENOSCOPY (EGD) WITH PROPOFOL;  Surgeon: Rogene Houston, MD;  [facility];  Service: Endoscopy;  Laterality: N/A;  1:35   ESOPHAGOGASTRODUODENOSCOPY (EGD) WITH PROPOFOL N/A 03/21/2021   Procedure: ESOPHAGOGASTRODUODENOSCOPY (EGD) WITH PROPOFOL;  Surgeon: Carol Ada, MD;  Location: [place];  Service: Endoscopy;  Laterality: N/A;   ESOPHAGOGASTRODUODENOSCOPY (EGD) WITH PROPOFOL N/A 10/23/2021   Procedure: ESOPHAGOGASTRODUODENOSCOPY (EGD) WITH PROPOFOL;  Surgeon: Loletha Carrow,  Kirke Corin, MD;  Location: [place];  Service: Gastroenterology;  Laterality: N/A;   ESOPHAGOGASTRODUODENOSCOPY (EGD) WITH PROPOFOL N/A 03/08/2022   Procedure: ESOPHAGOGASTRODUODENOSCOPY (EGD) WITH PROPOFOL;  Surgeon: Eloise Harman, DO;   [facility];  Service: Endoscopy;  Laterality: N/A;   ESOPHAGOGASTRODUODENOSCOPY (EGD) WITH PROPOFOL N/A 05/27/2022   Procedure: ESOPHAGOGASTRODUODENOSCOPY (EGD) WITH PROPOFOL;  Surgeon: Harvel Quale, MD;  [facility];  Service: Gastroenterology;  Laterality: N/A;   ESOPHAGOGASTRODUODENOSCOPY (EGD) WITH PROPOFOL N/A 05/30/2022   Procedure: ESOPHAGOGASTRODUODENOSCOPY (EGD) WITH PROPOFOL;  Surgeon: Eloise Harman, DO;  [facility];  Service: Endoscopy;  Laterality: N/A;   EXCISION MORTON'S NEUROMA     EYE SURGERY Left    retina   INCISION AND DRAINAGE OF WOUND Right 06/28/2021   Procedure: IRRIGATION AND DEBRIDEMENT WOUND;  Surgeon: [name], MD;  Location: [place];  Service: Plastics;  Laterality: Right;  1.5 hour   INSERTION OF DIALYSIS CATHETER Right 12/21/2021   Procedure: INSERTION OF TUNNELED DIALYSIS CATHETER;  Surgeon: Rosetta Posner, MD;  [facility];  Service: Vascular;  Laterality: Right;   LEFT HEART CATH AND CORS/GRAFTS ANGIOGRAPHY N/A 02/01/2022   Procedure: LEFT HEART CATH AND CORS/GRAFTS ANGIOGRAPHY;  Surgeon: Martinique, Peter M, MD;  Location: [place] [facility];  Service: Cardiovascular;  Laterality: N/A;   POLYPECTOMY  06/27/2019   Procedure: POLYPECTOMY;  Surgeon: Rogene Houston, MD;  [facility];  Service: Endoscopy;;  proximal sigmoid colon   RIGHT/LEFT HEART CATH AND CORONARY ANGIOGRAPHY N/A 03/12/2019   Procedure: RIGHT/LEFT HEART CATH AND CORONARY ANGIOGRAPHY;  Surgeon: [name], MD;  Location: [place] [facility];  Service: Cardiovascular;  Laterality: N/A;   SHOULDER ARTHROSCOPY WITH ROTATOR CUFF REPAIR AND SUBACROMIAL DECOMPRESSION Right 08/26/2020   Procedure: RIGHT SHOULDER MINI OPEN ROTATOR CUFF REPAIR AND SUBACROMIAL DECOMPRESSION WITH PATCH GRAFT;  Surgeon: Susa Day, MD;  Location: [facility];  Service: Orthopedics;  Laterality: Right;  90 MINS GENERAL WITH BLOCK   SKIN SPLIT GRAFT  Right 06/28/2021   Procedure: SKIN GRAFT SPLIT THICKNESS;  Surgeon: [name], MD;  Location: [place];  Service: Plastics;  Laterality: Right;   TEE WITHOUT CARDIOVERSION N/A 03/25/2019   Procedure: TRANSESOPHAGEAL ECHOCARDIOGRAM (TEE);  Surgeon: [name], MD;  Location: [place];  Service: Open Heart Surgery;  Laterality: N/A;   Social History   Socioeconomic History   Marital status: Single    Spouse name: Not on file   Number of children: Not on file   Years of education: 12   Highest education level: 12th grade  Occupational History   Not on file  Tobacco Use   Smoking status: Former    Types: Cigarettes    Quit date: 07/06/1988    Years since quitting: 34.2    Passive exposure: Past   Smokeless tobacco: Never  Vaping Use   Vaping Use: Never used  Substance and Sexual Activity   Alcohol use: Not Currently    Alcohol/week: 2.0 standard drinks of alcohol    Types: 2 Glasses of wine per week    Comment: socially   Drug use: No   Sexual activity: Not Currently    Partners: Female  Other Topics Concern   Not on file  Social History Narrative   Not on file   Social Determinants of Health   Financial Resource Strain: Low Risk  (04/21/2022)   Overall Financial Resource Strain (CARDIA)    Difficulty of Paying  Living Expenses: Not hard at all  Food Insecurity: No Food Insecurity (09/09/2022)   Hunger Vital Sign    Worried About Running Out of Food in the Last Year: Never true    Ran Out of Food in the Last Year: Never true  Transportation Needs: Unmet Transportation Needs (09/09/2022)   PRAPARE - Hydrologist (Medical): Yes    Lack of Transportation (Non-Medical): No  Physical Activity: Inactive (04/21/2022)   Exercise Vital Sign    Days of Exercise per Week: 0 days    Minutes of Exercise per Session: 0 min  Stress: No Stress Concern Present (04/21/2022)   [place]     Feeling of Stress : Only a little  Social Connections: Unknown (04/21/2022)   Social Connection and Isolation Panel [NHANES]    Frequency of Communication with Friends and Family: More than three times a week    Frequency of Social Gatherings with Friends and Family: More than three times a week    Attends Religious Services: More than 4 times per year    Active Member of [employer] or Organizations: Yes    Attends Music therapist: More than 4 times per year    Marital Status: Not on file   Family History  Problem Relation Age of Onset   Colon cancer Mother    Heart Problems Father    Diabetes Father    Valvular heart disease Father    Sleep apnea Neg Hx    Stroke Neg Hx    Allergies  Allergen Reactions   Bee Venom Anaphylaxis   Atorvastatin Other (See Comments)    myalgia   Diltiazem Itching   Lopressor [Metoprolol]     NIGHTMARES    Reglan [Metoclopramide] Other (See Comments)    Suicidal    Rosuvastatin Other (See Comments)    myalgias   Valsartan Itching     Positive ROS: All other systems have been reviewed and were otherwise negative with the exception of those mentioned in the HPI and as above.  Physical Exam: General: Alert, no acute distress Cardiovascular: No pedal edema Respiratory: No cyanosis, no use of accessory musculature Skin: No lesions in the area of chief complaint Neurologic: Sensation intact distally Psychiatric: Patient is competent for consent with normal mood and affect  MUSCULOSKELETAL:  RLE No traumatic wounds, ecchymosis, or rash  Nontender  Pain with log roll  No knee or ankle effusion  Sens DPN, SPN, TN intact  Motor EHL, ext, flex 5/5  DP 2+, PT 2+, No significant edema  LLE No traumatic wounds, ecchymosis, or rash  Nontender  No groin pain with log roll  No knee or ankle effusion  Sens DPN, SPN, TN intact  Motor EHL, ext, flex 5/5  DP 2+, PT 2+, No significant edema   IMAGING: X-rays right hip demonstrate  displaced intertrochanteric femur fracture  Assessment: Principal Problem:   Hip fracture (HCC) Active Problems:   DM type 2 causing vascular disease (HCC)   Chronic combined systolic and diastolic CHF (congestive heart failure) (HCC)   ESRD (end stage renal disease) on dialysis (HCC)   Depression   Paroxysmal supraventricular tachycardia   Hypokalemia  Right intertrochanteric femur fracture  Plan: Discussed with patient that he has an unstable right femoral fracture.  Would benefit from open duction total fixation to allow for pain control, early mobility. The risks benefits and alternatives were discussed with the patient including but not  limited to the risks of nonoperative treatment, versus surgical intervention including infection, bleeding, nerve injury, malunion, nonunion, the need for revision surgery, hardware prominence, hardware failure, the need for hardware removal, blood clots, cardiopulmonary complications, morbidity, mortality, among others, and they were willing to proceed.  Consent was signed by myself and the patient.  Right hip was marked.    Willaim Sheng, MD  Contact information:   HW:[phone] 7am-5pm epic message Dr. Zachery Dakins, or call office for patient follow up: [phone] After hours and holidays please check [email or web address] for group call information for [facility]

## 2022-09-10 NOTE — Consult Note (Signed)
[facility] Renal Consultation Note    Indication for Consultation:  Management of ESRD/hemodialysis, anemia, hypertension/volume, and secondary hyperparathyroidism. PCP:  HPI: [name] is a 67 y.o. male with ESRD, CAD s/p CABG, Hx remote esophageal cancer, T2DM who was admitted for R hip fracture after fall.  Presented to ED yesterday after a fall at home on to his R hip. Had come from a full HD, then ate lunch, then was going downstairs to have a nap when he slipped on the stairs, falling on to R hip with significant pain. No LOC or head injury. In ED, his vitals were stable and he was afebrile. Labs with Na 136, K 4.1, CO2 24, Alb 3, WBC 7.3, Hgb 10.9. Hip xray with comminuted intertrochanteric fracture of the R hip. Ortho consulted, for IMN today.  Seen pre-op, aside from the hip, he feels ok. No fever, chills, CP, dyspnea, abd pain, N/V/D, edema.  Dialyzes on MWF schedule at [facility]. Had a full treatment prior to the above events on 3/29. Uses LUE AVF - no recent issues.  Past Medical History:  Diagnosis Date   Anemia    Arthritis    hips shoulders   CAD (coronary artery disease) 03/03/2019   Cancer ([place])    Esophageal cancer 2022   CHF (congestive heart failure) ([place]) 2020   Chronic combined systolic and diastolic heart failure ([place]) 04/26/2018   Admitted 11/14-11/20/19-diuresed 10L   Depression 05/31/2021   Fatigue 10/14/2020   Hypertension    Myocardial infarction [facility])    Sleep apnea    uses a bipap machine   Stroke ([place]) 12/14/2020   no last weakness or paralysis   Type 2 diabetes mellitus with diabetic nephropathy ([place]) 04/23/2019   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   Past Surgical History:  Procedure Laterality Date   AV FISTULA PLACEMENT Left 12/21/2021   Procedure: LEFT ARM ARTERIOVENOUS (AV) FISTULA CREATION;  Surgeon: Rosetta Posner, MD;  Location: [facility];  Service: Vascular;  Laterality: Left;   BIOPSY  06/27/2019    Procedure: BIOPSY;  Surgeon: Rogene Houston, MD;  [facility];  Service: Endoscopy;;  ascending colon polyp   BIOPSY  10/21/2020   Procedure: BIOPSY;  Surgeon: Rogene Houston, MD;  [facility];  Service: Endoscopy;;  esophageal,gastric polyp   BIOPSY  02/24/2021   Procedure: BIOPSY;  Surgeon: Rogene Houston, MD;  [facility];  Service: Endoscopy;;  distal and proximal esophageal biopsies    BIOPSY  05/30/2022   Procedure: BIOPSY;  Surgeon: Eloise Harman, DO;  [facility];  Service: Endoscopy;;   CARDIAC SURGERY     CATARACT EXTRACTION     COLONOSCOPY N/A 06/27/2019   [name]:Diverticulosis in the entire examined colon. tubular adenoma in ascending colon, 31mm tubular adenoma in prox sigmoid. external hemorrhoids   CORONARY ARTERY BYPASS GRAFT N/A 03/25/2019   Procedure: CORONARY ARTERY BYPASS GRAFTING (CABG) X  4 USING LEFT INTERNAL MAMMARY ARTERY AND RIGHT SAPHENOUS VEIN GRAFTS;  Surgeon: [name], MD;  Location: [place];  Service: Open Heart Surgery;  Laterality: N/A;   ESOPHAGEAL BRUSHING  03/08/2022   Procedure: ESOPHAGEAL BRUSHING;  Surgeon: Eloise Harman, DO;  [facility];  Service: Endoscopy;;   ESOPHAGOGASTRODUODENOSCOPY (EGD) WITH PROPOFOL N/A 10/21/2020   rehman:Normal hypopharynx.Normal proximal esophagus and mid esophagus.Esophageal mucosal changes consistent with long-segment Barrett's esophagus. (Focal low-grade dysplasia and atypia proximally) z line irregular 37 cm from  incisors, 3 cm HH, single gastric polyp (fundic gland) normal duodenal bulb/second portion of duodenum, proximal margin of Barrett's at 34 cm   ESOPHAGOGASTRODUODENOSCOPY (EGD) WITH PROPOFOL N/A 02/24/2021   Procedure: ESOPHAGOGASTRODUODENOSCOPY (EGD) WITH PROPOFOL;  Surgeon: Rogene Houston, MD;  [facility];  Service: Endoscopy;  Laterality: N/A;  1:35   ESOPHAGOGASTRODUODENOSCOPY (EGD) WITH PROPOFOL N/A 03/21/2021    Procedure: ESOPHAGOGASTRODUODENOSCOPY (EGD) WITH PROPOFOL;  Surgeon: Carol Ada, MD;  Location: [place];  Service: Endoscopy;  Laterality: N/A;   ESOPHAGOGASTRODUODENOSCOPY (EGD) WITH PROPOFOL N/A 10/23/2021   Procedure: ESOPHAGOGASTRODUODENOSCOPY (EGD) WITH PROPOFOL;  Surgeon: Doran Stabler, MD;  Location: [place];  Service: Gastroenterology;  Laterality: N/A;   ESOPHAGOGASTRODUODENOSCOPY (EGD) WITH PROPOFOL N/A 03/08/2022   Procedure: ESOPHAGOGASTRODUODENOSCOPY (EGD) WITH PROPOFOL;  Surgeon: Eloise Harman, DO;  [facility];  Service: Endoscopy;  Laterality: N/A;   ESOPHAGOGASTRODUODENOSCOPY (EGD) WITH PROPOFOL N/A 05/27/2022   Procedure: ESOPHAGOGASTRODUODENOSCOPY (EGD) WITH PROPOFOL;  Surgeon: Harvel Quale, MD;  [facility];  Service: Gastroenterology;  Laterality: N/A;   ESOPHAGOGASTRODUODENOSCOPY (EGD) WITH PROPOFOL N/A 05/30/2022   Procedure: ESOPHAGOGASTRODUODENOSCOPY (EGD) WITH PROPOFOL;  Surgeon: Eloise Harman, DO;  [facility];  Service: Endoscopy;  Laterality: N/A;   EXCISION MORTON'S NEUROMA     EYE SURGERY Left    retina   INCISION AND DRAINAGE OF WOUND Right 06/28/2021   Procedure: IRRIGATION AND DEBRIDEMENT WOUND;  Surgeon: [name], MD;  Location: [place];  Service: Plastics;  Laterality: Right;  1.5 hour   INSERTION OF DIALYSIS CATHETER Right 12/21/2021   Procedure: INSERTION OF TUNNELED DIALYSIS CATHETER;  Surgeon: Rosetta Posner, MD;  [facility]: [facility];  Service: Vascular;  Laterality: Right;   LEFT HEART CATH AND CORS/GRAFTS ANGIOGRAPHY N/A 02/01/2022   Procedure: LEFT HEART CATH AND CORS/GRAFTS ANGIOGRAPHY;  Surgeon: Martinique, Peter M, MD;  Location: [place] [facility];  Service: Cardiovascular;  Laterality: N/A;   POLYPECTOMY  06/27/2019   Procedure: POLYPECTOMY;  Surgeon: Rogene Houston, MD;  [facility];  Service: Endoscopy;;  proximal sigmoid colon   RIGHT/LEFT HEART CATH AND CORONARY  ANGIOGRAPHY N/A 03/12/2019   Procedure: RIGHT/LEFT HEART CATH AND CORONARY ANGIOGRAPHY;  Surgeon: [name], MD;  Location: [place] [facility];  Service: Cardiovascular;  Laterality: N/A;   SHOULDER ARTHROSCOPY WITH ROTATOR CUFF REPAIR AND SUBACROMIAL DECOMPRESSION Right 08/26/2020   Procedure: RIGHT SHOULDER MINI OPEN ROTATOR CUFF REPAIR AND SUBACROMIAL DECOMPRESSION WITH PATCH GRAFT;  Surgeon: Susa Day, MD;  Location: [facility];  Service: Orthopedics;  Laterality: Right;  90 MINS GENERAL WITH BLOCK   SKIN SPLIT GRAFT Right 06/28/2021   Procedure: SKIN GRAFT SPLIT THICKNESS;  Surgeon: [name], MD;  Location: [place];  Service: Plastics;  Laterality: Right;   TEE WITHOUT CARDIOVERSION N/A 03/25/2019   Procedure: TRANSESOPHAGEAL ECHOCARDIOGRAM (TEE);  Surgeon: [name], MD;  Location: [place];  Service: Open Heart Surgery;  Laterality: N/A;   Family History  Problem Relation Age of Onset   Colon cancer Mother    Heart Problems Father    Diabetes Father    Valvular heart disease Father    Sleep apnea Neg Hx    Stroke Neg Hx    Social History:  reports that he quit smoking about 34 years ago. His smoking use included cigarettes. He has been exposed to tobacco smoke. He has never used smokeless tobacco. He reports that he does not currently use alcohol after  a past usage of about 2.0 standard drinks of alcohol per week. He reports that he does not use drugs.  ROS: As per HPI otherwise negative.  Physical Exam: Vitals:   09/10/22 0457 09/10/22 0538 09/10/22 0751 09/10/22 0937  BP: (!) 113/59 122/60 135/61 (!) 131/59  Pulse: 78 74 71 74  Resp: 16 16 18 18   Temp: 98.6 F (37 C) 99.3 F (37.4 C) 98 F (36.7 C) 98 F (36.7 C)  TempSrc: Oral Oral Oral Oral  SpO2: 93% 99% 97% (!) 88%  Weight:      Height:         General: Well developed, well nourished, in no acute distress. Nasal O2 in place. Head: Normocephalic, atraumatic, sclera non-icteric, mucus  membranes are moist. Neck: Supple without lymphadenopathy/masses. JVD not elevated. Lungs: Clear bilaterally to auscultation without wheezes, rales, or rhonchi. Breathing is unlabored. Heart: RRR with normal S1, S2. No murmurs, rubs, or gallops appreciated. Abdomen: Soft, non-tender, non-distended with normoactive bowel sounds.  Musculoskeletal:  Strength and tone appear normal for age. Lower extremities: No edema or ischemic changes, no open wounds. Neuro: Alert and oriented X 3. Moves all extremities spontaneously. Psych:  Responds to questions appropriately with a normal affect. Dialysis Access: LUE AVF + bruit  Allergies  Allergen Reactions   Bee Venom Anaphylaxis   Atorvastatin Other (See Comments)    myalgia   Diltiazem Itching   Lopressor [Metoprolol]     NIGHTMARES    Reglan [Metoclopramide] Other (See Comments)    Suicidal    Rosuvastatin Other (See Comments)    myalgias   Valsartan Itching   Prior to Admission medications   Medication Sig Start Date End Date Taking? Authorizing Provider  acetaminophen (TYLENOL) 325 MG tablet Take 2 tablets (650 mg total) by mouth every 6 (six) hours as needed for mild pain, moderate pain or fever. 08/02/21  Yes Allie Bossier, MD  albuterol (VENTOLIN HFA) 108 (90 Base) MCG/ACT inhaler INHALE TWO PUFFS BY MOUTH EVERY 6 HOURS AS NEEDED Patient taking differently: Inhale 2 puffs into the lungs every 6 (six) hours as needed for wheezing or shortness of breath. 08/11/22  Yes [name], [name], MD  amiodarone (PACERONE) 200 MG tablet Take 1 tablet (200 mg total) by mouth 2 (two) times daily. Through 09/08/22.  Then 1 tab (200 mg) once daily starting 09/09/22 Patient taking differently: Take 200 mg by mouth daily. Through 09/08/22.  Then 1 tab (200 mg) once daily starting 09/09/22 08/21/22  Yes Tat, Shanon Brow, MD  aspirin EC 325 MG tablet Take 1 tablet (325 mg total) by mouth daily with breakfast. 06/23/22  Yes Emokpae, Courage, MD  calcium acetate (PHOSLO)  667 MG capsule Take 667 mg by mouth in the morning, at noon, and at bedtime.   Yes [provider]  doxazosin (CARDURA) 2 MG tablet TAKE 1 TABLET BY MOUTH DAILY 07/29/22  Yes [name], Scott A, MD  EPINEPHrine 0.3 mg/0.3 mL IJ SOAJ injection Inject 0.3 mg into the muscle as needed for anaphylaxis. 01/25/21  Yes [name], [name], MD  Evolocumab (REPATHA SURECLICK) [id number] MG/ML SOAJ Inject 140 mg into the skin every 14 (fourteen) days. 06/29/22  Yes Loel Dubonnet, NP  hydrALAZINE (APRESOLINE) 50 MG tablet Take 1 tablet (50 mg total) by mouth 3 (three) times daily. 06/23/22  Yes Emokpae, Courage, MD  LANTUS 100 UNIT/ML injection 8 mLs daily. Sliding scale 04/07/22  Yes [provider]  metoprolol succinate (TOPROL-XL) 50 MG 24 hr tablet Take  1 tablet (50 mg total) by mouth daily. 08/02/22  Yes Loel Dubonnet, NP  nitroGLYCERIN (NITROSTAT) 0.4 MG SL tablet Place 1 tablet (0.4 mg total) under the tongue every 5 (five) minutes as needed for chest pain. 09/14/21  Yes Loel Dubonnet, NP  NOVOLOG FLEXPEN 100 UNIT/ML FlexPen Inject 4-10 Units into the skin 3 (three) times daily with meals. 07/07/22  Yes [provider]  ondansetron (ZOFRAN-ODT) 4 MG disintegrating tablet Take 1 tablet by mouth every 8 hours as needed for nausea and vomiting. 08/02/22  Yes Loel Dubonnet, NP  pantoprazole (PROTONIX) 40 MG tablet Take 1 tablet (40 mg total) by mouth 2 (two) times daily. 05/30/22  Yes Tat, Shanon Brow, MD  sertraline (ZOLOFT) 100 MG tablet TAKE 1 TABLET BY MOUTH DAILY 07/29/22  Yes [name], MD   Current Facility-Administered Medications  Medication Dose Route Frequency Provider Last Rate Last Admin   0.9 %  sodium chloride infusion   Intravenous Continuous Annye Asa, MD 10 mL/hr at 09/10/22 0947 New Bag at 09/10/22 0947   [MAR Hold] acetaminophen (TYLENOL) tablet 650 mg  650 mg Oral Q6H PRN Zierle-Ghosh, Asia B, DO       Or   [MAR Hold] acetaminophen (TYLENOL) suppository 650  mg  650 mg Rectal Q6H PRN Zierle-Ghosh, Asia B, DO       [MAR Hold] amiodarone (PACERONE) tablet 200 mg  200 mg Oral Daily Zierle-Ghosh, Asia B, DO       ceFAZolin (ANCEF) 2-4 GM/100ML-% IVPB            ceFAZolin (ANCEF) IVPB 2g/100 mL premix  2 g Intravenous On Call to OR Marchia Bond, MD       [facility] Hold] doxazosin (CARDURA) tablet 2 mg  2 mg Oral Daily Zierle-Ghosh, Asia B, DO       [MAR Hold] hydrALAZINE (APRESOLINE) tablet 50 mg  50 mg Oral TID Zierle-Ghosh, Asia B, DO   50 mg at 09/09/22 2336   HYDROmorphone (DILAUDID) injection 0.25-0.5 mg  0.25-0.5 mg Intravenous Q5 min PRN Annye Asa, MD       [facility] Hold] insulin aspart (novoLOG) injection 0-15 Units  0-15 Units Subcutaneous TID WC Zierle-Ghosh, Asia B, DO   2 Units at 09/10/22 0833   [facility] Hold] insulin aspart (novoLOG) injection 0-5 Units  0-5 Units Subcutaneous QHS Zierle-Ghosh, Asia B, DO   2 Units at 09/09/22 2350   insulin aspart (novoLOG) injection 0-7 Units  0-7 Units Subcutaneous Q2H PRN Annye Asa, MD       meperidine (DEMEROL) injection 6.25-12.5 mg  6.25-12.5 mg Intravenous Q5 min PRN Annye Asa, MD       midazolam (VERSED) injection 0.5-2 mg  0.5-2 mg Intravenous Once PRN Annye Asa, MD       [facility] Hold] morphine (PF) 2 MG/ML injection 2 mg  2 mg Intravenous Q2H PRN Zierle-Ghosh, Asia B, DO   2 mg at 09/10/22 0629   [MAR Hold] ondansetron (ZOFRAN) tablet 4 mg  4 mg Oral Q6H PRN Zierle-Ghosh, Asia B, DO       Or   [MAR Hold] ondansetron (ZOFRAN) injection 4 mg  4 mg Intravenous Q6H PRN Zierle-Ghosh, Asia B, DO       [MAR Hold] oxyCODONE (Oxy IR/ROXICODONE) immediate release tablet 5 mg  5 mg Oral Q4H PRN Zierle-Ghosh, Asia B, DO       oxyCODONE (Oxy IR/ROXICODONE) immediate release tablet 5 mg  5 mg Oral Once PRN Annye Asa, MD  Or   oxyCODONE (ROXICODONE) 5 MG/5ML solution 5 mg  5 mg Oral Once PRN Annye Asa, MD       [facility] Hold] pantoprazole (PROTONIX) EC tablet 40 mg  40 mg Oral  BID Zierle-Ghosh, Asia B, DO   40 mg at 09/09/22 2336   povidone-iodine 10 % swab 2 Application  2 Application Topical Once Marchia Bond, MD       promethazine (PHENERGAN) injection 6.25-12.5 mg  6.25-12.5 mg Intravenous Q15 min PRN Annye Asa, MD       [facility] Hold] sertraline (ZOLOFT) tablet 100 mg  100 mg Oral Daily Zierle-Ghosh, Asia B, DO       tranexamic acid (CYKLOKAPRON) 1000MG /177mL IVPB            tranexamic acid (CYKLOKAPRON) IVPB 1,000 mg  1,000 mg Intravenous To OR Marchia Bond, MD       Labs: Basic Metabolic Panel: Recent Labs  Lab 09/09/22 1805 09/10/22 0908  NA 134* 136  K 3.3* 4.1  CL 95* 97*  CO2 27 29  GLUCOSE 240* 159*  BUN 17 24*  CREATININE 2.63* 3.56*  CALCIUM 8.3* 8.3*   Liver Function Tests: Recent Labs  Lab 09/10/22 0908  AST 15  ALT 9  ALKPHOS 88  BILITOT 0.9  PROT 6.1*  ALBUMIN 3.0*   CBC: Recent Labs  Lab 09/09/22 1805 09/10/22 0908  WBC 9.1 7.3  NEUTROABS 7.4 5.3  HGB 11.2* 10.9*  HCT 35.5* 35.7*  MCV 92.2 93.2  PLT 202 200   Studies/Results: CT Head Wo Contrast  Result Date: 09/09/2022 CLINICAL DATA:  Trauma, fall EXAM: CT HEAD WITHOUT CONTRAST TECHNIQUE: Contiguous axial images were obtained from the base of the skull through the vertex without intravenous contrast. RADIATION DOSE REDUCTION: This exam was performed according to the departmental dose-optimization program which includes automated exposure control, adjustment of the mA and/or kV according to patient size and/or use of iterative reconstruction technique. COMPARISON:  03/03/2022 FINDINGS: Brain: No acute intracranial findings are seen. Ventricles are nondilated. Cortical sulci are prominent. There is decreased density in periventricular and subcortical white matter. Vascular: Scattered arterial calcifications are seen. Skull: No fracture is seen in calvarium. Sinuses/Orbits: Unremarkable. Other: None. IMPRESSION: No acute intracranial findings are seen in noncontrast  CT brain. Atrophy. Small-vessel disease. Electronically Signed   By: Elmer Picker M.D.   On: 09/09/2022 19:45   DG Knee 1-2 Views Right  Result Date: 09/09/2022 CLINICAL DATA:  Fall at home with pain to the right leg EXAM: RIGHT KNEE - 2 VIEW COMPARISON:  None Available. FINDINGS: No evidence of fracture or dislocation. Likely joint effusion, although assessment is technically challenging in the absence of true lateral view. No evidence of arthropathy or other focal bone abnormality. Soft tissues are unremarkable. IMPRESSION: No evidence of fracture or dislocation. Likely joint effusion, although assessment is technically challenging in the absence of true lateral view. Electronically Signed   By: Darrin Nipper M.D.   On: 09/09/2022 18:45   DG Hip Unilat W or Wo Pelvis 2-3 Views Right  Result Date: 09/09/2022 CLINICAL DATA:  Pain after fall EXAM: DG HIP (WITH OR WITHOUT PELVIS) 3V RIGHT COMPARISON:  None Available. FINDINGS: Comminuted foreshortened intertrochanteric right hip fracture identified. Mild displaced fragments. Moderate joint space loss of the hips bilaterally. Osteopenia. Hyperostosis. Scattered vascular calcifications. Sclerosis along the right supra-acetabular region. Possible bone islands. Please correlate for any known history. This is stable going back to abdomen and pelvis CT of 03/03/2022. IMPRESSION: Comminuted intertrochanteric  fracture of the right hip with mildly displaced fragments. Some foreshortening. Electronically Signed   By: Jill Side M.D.   On: 09/09/2022 18:05    Dialysis Orders:  MWF at [place], 400/600, EDW 84.5kg, 2K/2.5Ca, LUE AVF, heparin 5000 unit bolus - Mircera 173mcg IV q 2 weeks (last 3/29) - Getting course of IV iron - No VRDA, last given 3/6 - on hold for low PTH (33)  Assessment/Plan:  R hip intertrochanteric Fx s/p fall: For IMN today. Per ortho.  ESRD:  Continue usual MWF schedule for dialysis - next 4/1.   Hypertension/volume: BP stable, no  edema on exam. Continue home meds.  Anemia: Hgb 10.9 - not due for ESA yet.  Metabolic bone disease: Ca ok, Phos pending. Continue home binders.  Nutrition:  Alb low, start protein supplements to assist with healing.  T2DM  CAD, Hx CABG  Veneta Penton, PA-C 09/10/2022, 10:55 AM  [facility]

## 2022-09-10 NOTE — Plan of Care (Signed)

## 2022-09-10 NOTE — Progress Notes (Signed)
Patient received from PACU. A/O x 4. VSS. Family is at the bedside.

## 2022-09-10 NOTE — Anesthesia Postprocedure Evaluation (Signed)
Anesthesia Post Note  Patient: HORACE ROBARDS  Procedure(s) Performed: INTRAMEDULLARY (IM) NAIL INTERTROCHANTERIC (Right)     Patient location during evaluation: PACU Anesthesia Type: General Level of consciousness: awake and alert, patient cooperative and oriented Pain management: pain level controlled Vital Signs Assessment: post-procedure vital signs reviewed and stable Respiratory status: spontaneous breathing, nonlabored ventilation, respiratory function stable and patient connected to nasal cannula oxygen Cardiovascular status: blood pressure returned to baseline and stable Postop Assessment: no apparent nausea or vomiting Anesthetic complications: no   No notable events documented.  Last Vitals:  Vitals:   09/10/22 1506 09/10/22 1522  BP: (!) 101/54 (!) 107/57  Pulse: 77 79  Resp: 15 17  Temp:    SpO2: 99% 94%    Last Pain:  Vitals:   09/10/22 1522  TempSrc:   PainSc: 4                  Gwendolyne Welford,E. Aero Drummonds

## 2022-09-10 NOTE — Transfer of Care (Signed)
Immediate Anesthesia Transfer of Care Note  Patient: [name]  Procedure(s) Performed: INTRAMEDULLARY (IM) NAIL INTERTROCHANTERIC (Right)  Patient Location: PACU  Anesthesia Type:General  Level of Consciousness: awake and alert   Airway & Oxygen Therapy: Patient Spontanous Breathing and Patient connected to face mask oxygen  Post-op Assessment: Report given to RN and Post -op Vital signs reviewed and stable  Post vital signs: Reviewed and stable  Last Vitals:  Vitals Value Taken Time  BP 116/51 09/10/22 1353  Temp 37.1 C 09/10/22 1353  Pulse 78 09/10/22 1400  Resp 20 09/10/22 1400  SpO2 92 % 09/10/22 1400  Vitals shown include unvalidated device data.  Last Pain:  Vitals:   09/10/22 0945  TempSrc:   PainSc: 4          Complications: No notable events documented.

## 2022-09-10 NOTE — Anesthesia Procedure Notes (Signed)
Procedure Name: Intubation Date/Time: 09/10/2022 12:04 PM  Performed by: Inda Coke, CRNAPre-anesthesia Checklist: Patient identified, Emergency Drugs available, Suction available, Timeout performed and Patient being monitored Patient Re-evaluated:Patient Re-evaluated prior to induction Oxygen Delivery Method: Circle system utilized Preoxygenation: Pre-oxygenation with 100% oxygen Induction Type: IV induction Ventilation: Mask ventilation without difficulty Laryngoscope Size: Mac and 4 Grade View: Grade I Tube type: Oral Tube size: 7.5 mm Number of attempts: 1 Airway Equipment and Method: Stylet Placement Confirmation: ETT inserted through vocal cords under direct vision, positive ETCO2, CO2 detector and breath sounds checked- equal and bilateral Secured at: 22 cm Tube secured with: Tape Dental Injury: Teeth and Oropharynx as per pre-operative assessment

## 2022-09-10 NOTE — Progress Notes (Signed)
PROGRESS NOTE  [name] [id number] DOB: 1955-12-19 DOA: 09/09/2022 PCP: [name], MD  HPI/Recap of past 24 hours: [name] is a 67 y.o. male with medical history significant of coronary artery disease, CHF, depression, hypertension, history of stroke, diabetes mellitus type 2, and more who initially presented to [facility], ED after a mechanical fall.  Workup revealed right hip fracture, communicated intertrochanteric fracture of right hip with mildly displaced fragments.  Transferred to [facility] for orthopedic surgery evaluation.  Seen by orthopedic surgery and taken to the OR on 09/10/2022.  09/10/2022: The patient was seen and examined at his bedside prior to surgery.  No complaints.  Assessment/Plan: Principal Problem:   Hip fracture (HCC) Active Problems:   ESRD (end stage renal disease) on dialysis (HCC)   Chronic combined systolic and diastolic CHF (congestive heart failure) (HCC)   Paroxysmal supraventricular tachycardia   Hypokalemia   DM type 2 causing vascular disease ([place])   Depression  Right hip fracture ([place]) post mechanical fall, POA - X-ray shows comminuted intertrochanteric fracture of right hip with mildly displaced fragments Transferred to [facility] for orthopedic surgery taken to the OR on 09/10/2022. Continue pain control.   ESRD (end stage renal disease) on dialysis [facility]) on HD MWF Last hemodialysis on 09/09/2022. HD consulted to continue hemodialysis while inpatient.   Chronic combined systolic and diastolic CHF (congestive heart failure) (HCC) Aspirin held due to planned orthopedic surgery. Euvolemic on exam. - Continue metoprolol - Resume Repatha at discharge   Resolved hypokalemia   Paroxysmal supraventricular tachycardia - Continue amiodarone Rate is currently controlled Continue to monitor on telemetry   DM type 2 causing vascular disease (HCC) Insulin sliding scale.  Avoid hypoglycemia.    Depression - Continue Zoloft       Advance Care Planning:   Code Status: Full Code   Consults: Nephrology and orthopedics   Family Communication: Friend/caretaker at bedside    Status is: Inpatient The patient requires at least 2 midnights for further evaluation and treatment of present condition.    Objective: Vitals:   09/10/22 0937 09/10/22 1353 09/10/22 1400 09/10/22 1415  BP: (!) 131/59 (!) 116/51 (!) 101/51 (!) 108/54  Pulse: 74 79 81 77  Resp: 18 12 20 16   Temp: 98 F (36.7 C) 98.7 F (37.1 C)    TempSrc: Oral     SpO2: (!) 88% 91% 93% 95%  Weight:      Height:        Intake/Output Summary (Last 24 hours) at 09/10/2022 1426 Last data filed at 09/10/2022 1315 Gross per 24 hour  Intake 1140 ml  Output 200 ml  Net 940 ml   Filed Weights   09/09/22 1735 09/09/22 2336  Weight: 81.6 kg 84.7 kg    Exam:  General: 67 y.o. year-old male well developed well nourished in no acute distress.  Alert and oriented x3. Cardiovascular: Regular rate and rhythm with no rubs or gallops.  No thyromegaly or JVD noted.   Respiratory: Clear to auscultation with no wheezes or rales. Good inspiratory effort. Abdomen: Soft nontender nondistended with normal bowel sounds x4 quadrants. Musculoskeletal: No lower extremity edema. 2/4 pulses in all 4 extremities. Skin: No ulcerative lesions noted or rashes, Psychiatry: Mood is appropriate for condition and setting   Data Reviewed: CBC: Recent Labs  Lab 09/09/22 1805 09/10/22 0908  WBC 9.1 7.3  NEUTROABS 7.4 5.3  HGB 11.2* 10.9*  HCT 35.5* 35.7*  MCV 92.2 93.2  PLT 202 [id number]   Basic Metabolic Panel: Recent Labs  Lab 09/09/22 1805 09/10/22 0908  NA 134* 136  K 3.3* 4.1  CL 95* 97*  CO2 27 29  GLUCOSE 240* 159*  BUN 17 24*  CREATININE 2.63* 3.56*  CALCIUM 8.3* 8.3*  MG  --  3.0*   GFR: Estimated Creatinine Clearance: 21.1 mL/min (A) (by C-G formula based on SCr of 3.56 mg/dL (H)). Liver Function Tests: Recent  Labs  Lab 09/10/22 0908  AST 15  ALT 9  ALKPHOS 88  BILITOT 0.9  PROT 6.1*  ALBUMIN 3.0*   No results for input(s): "LIPASE", "AMYLASE" in the last 168 hours. No results for input(s): "AMMONIA" in the last 168 hours. Coagulation Profile: No results for input(s): "INR", "PROTIME" in the last 168 hours. Cardiac Enzymes: No results for input(s): "CKTOTAL", "CKMB", "CKMBINDEX", "TROPONINI" in the last 168 hours. BNP (last 3 results) No results for input(s): "PROBNP" in the last 8760 hours. HbA1C: No results for input(s): "HGBA1C" in the last 72 hours. CBG: Recent Labs  Lab 09/09/22 2335 09/10/22 0830 09/10/22 1151 09/10/22 1354  GLUCAP 240* 148* 139* 154*   Lipid Profile: No results for input(s): "CHOL", "HDL", "LDLCALC", "TRIG", "CHOLHDL", "LDLDIRECT" in the last 72 hours. Thyroid Function Tests: No results for input(s): "TSH", "T4TOTAL", "FREET4", "T3FREE", "THYROIDAB" in the last 72 hours. Anemia Panel: No results for input(s): "VITAMINB12", "FOLATE", "FERRITIN", "TIBC", "IRON", "RETICCTPCT" in the last 72 hours. Urine analysis:    Component Value Date/Time   COLORURINE YELLOW 02/28/2022 1414   APPEARANCEUR CLEAR 02/28/2022 1414   LABSPEC 1.014 02/28/2022 1414   PHURINE 5.0 02/28/2022 1414   GLUCOSEU >=500 (A) 02/28/2022 1414   HGBUR NEGATIVE 02/28/2022 1414   BILIRUBINUR NEGATIVE 02/28/2022 1414   KETONESUR NEGATIVE 02/28/2022 1414   PROTEINUR >=300 (A) 02/28/2022 1414   NITRITE NEGATIVE 02/28/2022 1414   LEUKOCYTESUR NEGATIVE 02/28/2022 1414   Sepsis Labs: @LABRCNTIP (procalcitonin:4,lacticidven:4)  ) Recent Results (from the past 240 hour(s))  Surgical pcr screen     Status: None   Collection Time: 09/10/22  1:00 AM   Specimen: Nasal Mucosa; Nasal Swab  Result Value Ref Range Status   MRSA, PCR NEGATIVE NEGATIVE Final   Staphylococcus aureus NEGATIVE NEGATIVE Final    Comment: (NOTE) The Xpert SA Assay (FDA approved for NASAL specimens in patients  57 years of age and older), is one component of a comprehensive surveillance program. It is not intended to diagnose infection nor to guide or monitor treatment. Performed at [facility], [place] [address] [place], [place] [address]       Studies: DG C-Arm 1-60 Min-No Report  Result Date: 09/10/2022 Fluoroscopy was utilized by the requesting physician.  No radiographic interpretation.   CT Head Wo Contrast  Result Date: 09/09/2022 CLINICAL DATA:  Trauma, fall EXAM: CT HEAD WITHOUT CONTRAST TECHNIQUE: Contiguous axial images were obtained from the base of the skull through the vertex without intravenous contrast. RADIATION DOSE REDUCTION: This exam was performed according to the departmental dose-optimization program which includes automated exposure control, adjustment of the mA and/or kV according to patient size and/or use of iterative reconstruction technique. COMPARISON:  03/03/2022 FINDINGS: Brain: No acute intracranial findings are seen. Ventricles are nondilated. Cortical sulci are prominent. There is decreased density in periventricular and subcortical white matter. Vascular: Scattered arterial calcifications are seen. Skull: No fracture is seen in calvarium. Sinuses/Orbits: Unremarkable. Other: None. IMPRESSION: No acute intracranial findings are seen in noncontrast CT brain. Atrophy. Small-vessel disease. Electronically  Signed   By: Elmer Picker M.D.   On: 09/09/2022 19:45   DG Knee 1-2 Views Right  Result Date: 09/09/2022 CLINICAL DATA:  Fall at home with pain to the right leg EXAM: RIGHT KNEE - 2 VIEW COMPARISON:  None Available. FINDINGS: No evidence of fracture or dislocation. Likely joint effusion, although assessment is technically challenging in the absence of true lateral view. No evidence of arthropathy or other focal bone abnormality. Soft tissues are unremarkable. IMPRESSION: No evidence of fracture or dislocation. Likely joint effusion, although assessment is  technically challenging in the absence of true lateral view. Electronically Signed   By: Darrin Nipper M.D.   On: 09/09/2022 18:45   DG Hip Unilat W or Wo Pelvis 2-3 Views Right  Result Date: 09/09/2022 CLINICAL DATA:  Pain after fall EXAM: DG HIP (WITH OR WITHOUT PELVIS) 3V RIGHT COMPARISON:  None Available. FINDINGS: Comminuted foreshortened intertrochanteric right hip fracture identified. Mild displaced fragments. Moderate joint space loss of the hips bilaterally. Osteopenia. Hyperostosis. Scattered vascular calcifications. Sclerosis along the right supra-acetabular region. Possible bone islands. Please correlate for any known history. This is stable going back to abdomen and pelvis CT of 03/03/2022. IMPRESSION: Comminuted intertrochanteric fracture of the right hip with mildly displaced fragments. Some foreshortening. Electronically Signed   By: Jill Side M.D.   On: 09/09/2022 18:05    Scheduled Meds:  (feeding supplement) PROSource Plus  30 mL Oral BID BM   [MAR Hold] amiodarone  200 mg Oral Daily   [MAR Hold] doxazosin  2 mg Oral Daily   [MAR Hold] hydrALAZINE  50 mg Oral TID   HYDROmorphone       [MAR Hold] insulin aspart  0-15 Units Subcutaneous TID WC   [MAR Hold] insulin aspart  0-5 Units Subcutaneous QHS   [MAR Hold] pantoprazole  40 mg Oral BID   povidone-iodine  2 Application Topical Once   [MAR Hold] sertraline  100 mg Oral Daily    Continuous Infusions:  sodium chloride 10 mL/hr at 09/10/22 1154     LOS: 1 day     Kayleen Memos, MD [facility] Pager [phone]  If 7PM-7AM, please contact night-coverage [email or web address] Password [email or web address] 09/10/2022, 2:26 PM

## 2022-09-10 NOTE — Op Note (Signed)
DATE OF SURGERY:  09/10/2022  TIME: 2:03 PM  PATIENT NAME:  [name]  AGE: 67 y.o.  PRE-OPERATIVE DIAGNOSIS: Right intertrochanteric femur fracture  POST-OPERATIVE DIAGNOSIS:  SAME  PROCEDURE:  INTRAMEDULLARY (IM) NAIL INTERTROCHANTERIC  SURGEON:  Lateef Juncaj A Akirah Storck  ASSISTANT: Dorise Bullion, PA-C, was present and scrubbed throughout the case, critical for assistance with exposure, retraction, instrumentation, and closure.  OPERATIVE IMPLANTS:  Tamala [employer] Intertan Nail 11.5 x  380 mm , 95/90 lag compression screw, 1 distal interlock  Implant Name Type Inv. Item Serial No. Manufacturer Lot No. LRB No. Used Action  NAIL IM CANN 11.5X380 130D - [id number] Nail NAIL IM CANN 11.5X380 130D  [employer] [id number] Right 1 Implanted  SCREW LAG COMPR KIT 95/90 - [id number] Screw SCREW LAG COMPR KIT 95/90  [employer] [id number] Right 1 Implanted  SCREW TRIGEN LOW PROF 5.0X42.5 - [id number] Screw SCREW TRIGEN LOW PROF 5.0X42.5  [employer] [id number] Right 1 Implanted      ESTIMATED BLOOD LOSS: 200cc  PREOPERATIVE INDICATIONS:  [name] is a 67 y.o. year old who fell and suffered an Right Hip fracture. He was brought into the ER and then admitted and optimized and then elected for surgical intervention.    The risks benefits and alternatives were discussed with the patient including but not limited to the risks of nonoperative treatment, versus surgical intervention including infection, bleeding, nerve injury, malunion, nonunion, hardware prominence, hardware failure, need for hardware removal, blood clots, cardiopulmonary complications, morbidity, mortality, among others, and they were willing to proceed.    OPERATIVE PROCEDURE:  The patient was brought to the operating room and placed in the supine position. Anesthesia was administered. He was placed on the fracture table.  Closed reduction was performed under C-arm  guidance.  Time out was then performed after sterile prep and drape. He received preoperative antibiotics.  Small incision proximal to the greater trochanter was made and carried down through skin and subcutaneous tissue.  Threaded guidewire was directed at the tip of the greater trochanter and advanced into the proximal metaphysis.  Positioning was confirmed with fluoroscopy.  I then used an entry reamer to enter the medullary canal.  Given distal extent of the fracture below the lesser trochanter elected for a long nail.  A guidewire was placed distally to the knee and the length of the nail was measured to be 38 cm.  Sequentially reamed got some chatter with a 12.5 mm reamer so elected to use an 11.5 mm nail.  I then passed a 11.5 x 380 mm InterTAN down the center of the canal attached to the targeting arm.  I then used the targeting arm to make a percutaneous incision and directed a threaded guidewire up into the head/neck segment.  I confirmed adequate tip apex distance and measured the length.  I decided to place a 95 mm screw.  I then drilled the path for the compression screw and placed an antirotation bar.  I then placed the lag screw and then placed the compression screw and compressed approximately 5 mm.  The proximal portion of the nail was statically locked.   I then used perfect circle technique to place a distal interlocking screw.     Final fluoroscopic imaging was obtained.    The wounds were irrigated copiously, and vancomycin powder was placed in the wounds.  Wounds were injected with 30 cc 0.5% Marcaine.  The gluteal fascia was closed with 1  Vicryl, and skin was closed with 2-0 Vicryl and 3-0 Monocryl.  Sterile dressing was applied with Dermabond, 4 x 4 gauze, and Tegaderm.  The patient was awakened and returned to PACU in stable and satisfactory condition. There were no complications and the patient tolerated the procedure well.   Post op recs: WB: WBAT RLE Abx: ancef x23 hours post  op Imaging: PACU xrays Dressing: keep intact until follow up, change PRN if soiled or saturated. DVT prophylaxis: lovenox starting POD1 x4 weeks Follow up: 2 weeks after surgery for a wound check with Dr. Zachery Dakins at [facility].  Address: [address], [place], [place] [address]  Office Phone: [phone]  Charlies Constable, MD Orthopaedic Surgery

## 2022-09-11 ENCOUNTER — Encounter (HOSPITAL_COMMUNITY): Payer: Self-pay | Admitting: Orthopedic Surgery

## 2022-09-11 DIAGNOSIS — N186 End stage renal disease: Secondary | ICD-10-CM | POA: Diagnosis not present

## 2022-09-11 DIAGNOSIS — Z992 Dependence on renal dialysis: Secondary | ICD-10-CM | POA: Diagnosis not present

## 2022-09-11 DIAGNOSIS — E1122 Type 2 diabetes mellitus with diabetic chronic kidney disease: Secondary | ICD-10-CM | POA: Diagnosis not present

## 2022-09-11 DIAGNOSIS — S72001A Fracture of unspecified part of neck of right femur, initial encounter for closed fracture: Secondary | ICD-10-CM | POA: Diagnosis not present

## 2022-09-11 LAB — CBC
HCT: 28.4 % — ABNORMAL LOW (ref 39.0–52.0)
Hemoglobin: 9.1 g/dL — ABNORMAL LOW (ref 13.0–17.0)
MCH: 29 pg (ref 26.0–34.0)
MCHC: 32 g/dL (ref 30.0–36.0)
MCV: 90.4 fL (ref 80.0–100.0)
Platelets: 199 10*3/uL (ref 150–400)
RBC: 3.14 MIL/uL — ABNORMAL LOW (ref 4.22–5.81)
RDW: 19.2 % — ABNORMAL HIGH (ref 11.5–15.5)
WBC: 8.7 10*3/uL (ref 4.0–10.5)
nRBC: 0 % (ref 0.0–0.2)

## 2022-09-11 LAB — BASIC METABOLIC PANEL
Anion gap: 15 (ref 5–15)
BUN: 37 mg/dL — ABNORMAL HIGH (ref 8–23)
CO2: 24 mmol/L (ref 22–32)
Calcium: 8 mg/dL — ABNORMAL LOW (ref 8.9–10.3)
Chloride: 96 mmol/L — ABNORMAL LOW (ref 98–111)
Creatinine, Ser: 4.42 mg/dL — ABNORMAL HIGH (ref 0.61–1.24)
GFR, Estimated: 14 mL/min — ABNORMAL LOW (ref >60)
Glucose, Bld: 261 mg/dL — ABNORMAL HIGH (ref 70–99)
Potassium: 4.7 mmol/L (ref 3.5–5.1)
Sodium: 135 mmol/L (ref 135–145)

## 2022-09-11 LAB — GLUCOSE, CAPILLARY
Glucose-Capillary: 240 mg/dL — ABNORMAL HIGH (ref 70–99)
Glucose-Capillary: 250 mg/dL — ABNORMAL HIGH (ref 70–99)
Glucose-Capillary: 238 mg/dL — ABNORMAL HIGH (ref 70–99)
Glucose-Capillary: 246 mg/dL — ABNORMAL HIGH (ref 70–99)

## 2022-09-11 LAB — PHOSPHORUS: Phosphorus: 4.1 mg/dL (ref 2.5–4.6)

## 2022-09-11 LAB — MAGNESIUM: Magnesium: 2.9 mg/dL — ABNORMAL HIGH (ref 1.7–2.4)

## 2022-09-11 LAB — HEPATITIS B SURFACE ANTIGEN: Hepatitis B Surface Ag: NONREACTIVE

## 2022-09-11 MED ORDER — CHLORHEXIDINE GLUCONATE CLOTH 2 % EX PADS
6.0000 | MEDICATED_PAD | Freq: Every day | CUTANEOUS | Status: DC
  Administered 2022-09-11 – 2022-09-17 (×7): 6 via TOPICAL

## 2022-09-11 MED ORDER — ENOXAPARIN SODIUM 30 MG/0.3ML IJ SOSY
30.0000 mg | PREFILLED_SYRINGE | INTRAMUSCULAR | Status: DC
  Administered 2022-09-12 – 2022-09-17 (×6): 30 mg via SUBCUTANEOUS
  Filled 2022-09-11 (×6): qty 0.3

## 2022-09-11 NOTE — Progress Notes (Signed)
PROGRESS NOTE  [name] [id number] DOB: April 10, 1956 DOA: 09/09/2022 PCP: [name], MD  HPI/Recap of past 24 hours: [name] is a 67 y.o. male with medical history significant of coronary artery disease, CHF, depression, hypertension, history of stroke, diabetes mellitus type 2, and more who initially presented to [facility], ED after a mechanical fall.  Workup revealed right hip fracture, communicated intertrochanteric fracture of right hip with mildly displaced fragments.  Transferred to [facility] for orthopedic surgery evaluation.  Seen by orthopedic surgery and taken to the OR on 09/10/2022.     Pt denies any new complaints, except for some mild post op pain    Assessment/Plan: Principal Problem:   Hip fracture (HCC) Active Problems:   ESRD (end stage renal disease) on dialysis (HCC)   Chronic combined systolic and diastolic CHF (congestive heart failure) (HCC)   Paroxysmal supraventricular tachycardia   Hypokalemia   DM type 2 causing vascular disease ([place])   Depression   Right hip fracture (HCC) post mechanical fall, POA s/p IM nail intertrochanteric X-ray shows comminuted intertrochanteric fracture of right hip with mildly displaced fragments Continue pain control, dvt ppx as per orthopedics PT/OT   ESRD (end stage renal disease) on dialysis ([place]) on HD MWF Last hemodialysis on 09/09/2022. Nephrology on board, HD tomorrow   Chronic combined systolic and diastolic CHF (congestive heart failure) ([place]) Euvolemic on exam. Continue metoprolol Resume Repatha at discharge   Paroxysmal supraventricular tachycardia Continue amiodarone Rate is currently controlled   DM type 2 causing vascular disease ([place]) Insulin sliding scale   Depression Continue Zoloft       Advance Care Planning:   Code Status: Full Code   Consults: Nephrology and orthopedics   Family Communication: None at bedside    Status is: Inpatient The patient  requires at least 2 midnights for further evaluation and treatment of present condition.    Objective: Vitals:   09/10/22 2030 09/11/22 0540 09/11/22 0743 09/11/22 1500  BP:  121/60 134/65 (!) 116/58  Pulse:  73 72 77  Resp: 16 16 16    Temp:  98 F (36.7 C) 97.9 F (36.6 C)   TempSrc:  Oral Oral   SpO2:  96% 100% 90%  Weight:      Height:        Intake/Output Summary (Last 24 hours) at 09/11/2022 1651 Last data filed at 09/11/2022 0900 Gross per 24 hour  Intake 580 ml  Output 100 ml  Net 480 ml   Filed Weights   09/09/22 1735 09/09/22 2336  Weight: 81.6 kg 84.7 kg    Exam: General: NAD  Cardiovascular: S1, S2 present Respiratory: CTAB Abdomen: Soft, nontender, nondistended, bowel sounds present Musculoskeletal: No bilateral pedal edema noted Skin: Normal Psychiatry: Normal mood     Data Reviewed: CBC: Recent Labs  Lab 09/09/22 1805 09/10/22 0908 09/11/22 0459  WBC 9.1 7.3 8.7  NEUTROABS 7.4 5.3  --   HGB 11.2* 10.9* 9.1*  HCT 35.5* 35.7* 28.4*  MCV 92.2 93.2 90.4  PLT 202 200 [id number]   Basic Metabolic Panel: Recent Labs  Lab 09/09/22 1805 09/10/22 0908 09/11/22 0459  NA 134* 136 135  K 3.3* 4.1 4.7  CL 95* 97* 96*  CO2 27 29 24   GLUCOSE 240* 159* 261*  BUN 17 24* 37*  CREATININE 2.63* 3.56* 4.42*  CALCIUM 8.3* 8.3* 8.0*  MG  --  3.0* 2.9*   GFR: Estimated Creatinine Clearance: 17 mL/min (A) (by C-G formula  based on SCr of 4.42 mg/dL (H)). Liver Function Tests: Recent Labs  Lab 09/10/22 0908  AST 15  ALT 9  ALKPHOS 88  BILITOT 0.9  PROT 6.1*  ALBUMIN 3.0*   No results for input(s): "LIPASE", "AMYLASE" in the last 168 hours. No results for input(s): "AMMONIA" in the last 168 hours. Coagulation Profile: No results for input(s): "INR", "PROTIME" in the last 168 hours. Cardiac Enzymes: No results for input(s): "CKTOTAL", "CKMB", "CKMBINDEX", "TROPONINI" in the last 168 hours. BNP (last 3 results) No results for input(s): "PROBNP" in the  last 8760 hours. HbA1C: No results for input(s): "HGBA1C" in the last 72 hours. CBG: Recent Labs  Lab 09/10/22 1705 09/10/22 1944 09/11/22 0746 09/11/22 1156 09/11/22 1534  GLUCAP 202* 245* 240* 250* 238*   Lipid Profile: No results for input(s): "CHOL", "HDL", "LDLCALC", "TRIG", "CHOLHDL", "LDLDIRECT" in the last 72 hours. Thyroid Function Tests: No results for input(s): "TSH", "T4TOTAL", "FREET4", "T3FREE", "THYROIDAB" in the last 72 hours. Anemia Panel: No results for input(s): "VITAMINB12", "FOLATE", "FERRITIN", "TIBC", "IRON", "RETICCTPCT" in the last 72 hours. Urine analysis:    Component Value Date/Time   COLORURINE YELLOW 02/28/2022 1414   APPEARANCEUR CLEAR 02/28/2022 1414   LABSPEC 1.014 02/28/2022 1414   PHURINE 5.0 02/28/2022 1414   GLUCOSEU >=500 (A) 02/28/2022 1414   HGBUR NEGATIVE 02/28/2022 1414   BILIRUBINUR NEGATIVE 02/28/2022 1414   KETONESUR NEGATIVE 02/28/2022 1414   PROTEINUR >=300 (A) 02/28/2022 1414   NITRITE NEGATIVE 02/28/2022 1414   LEUKOCYTESUR NEGATIVE 02/28/2022 1414   Sepsis Labs: @LABRCNTIP (procalcitonin:4,lacticidven:4)  ) Recent Results (from the past 240 hour(s))  Surgical pcr screen     Status: None   Collection Time: 09/10/22  1:00 AM   Specimen: Nasal Mucosa; Nasal Swab  Result Value Ref Range Status   MRSA, PCR NEGATIVE NEGATIVE Final   Staphylococcus aureus NEGATIVE NEGATIVE Final    Comment: (NOTE) The Xpert SA Assay (FDA approved for NASAL specimens in patients 38 years of age and older), is one component of a comprehensive surveillance program. It is not intended to diagnose infection nor to guide or monitor treatment. Performed at [facility], [place] [address] [place], [place] [address]       Studies: No results found.  Scheduled Meds:  (feeding supplement) PROSource Plus  30 mL Oral BID BM   amiodarone  200 mg Oral Daily   Chlorhexidine Gluconate Cloth  6 each Topical Q0600   doxazosin  2 mg Oral Daily    [START ON 09/12/2022] enoxaparin (LOVENOX) injection  30 mg Subcutaneous Q24H   hydrALAZINE  50 mg Oral TID   insulin aspart  0-15 Units Subcutaneous TID WC   insulin aspart  0-5 Units Subcutaneous QHS   pantoprazole  40 mg Oral BID   senna-docusate  1 tablet Oral QHS   sertraline  100 mg Oral Daily    Continuous Infusions:     LOS: 2 days     Alma Friendly, MD [facility]   If 7PM-7AM, please contact night-coverage [email or web address] 09/11/2022, 4:51 PM

## 2022-09-11 NOTE — Evaluation (Signed)
Occupational Therapy Evaluation Patient Details Name: [name] MRN: [id number] DOB: [birth year] Today's Date: 09/11/2022   History of Present Illness 67 y.o. male with medical history significant of coronary artery disease, CHF, depression, hypertension, history of stroke, diabetes mellitus type 2, and more presented to the ED with a chief complaint of fall.  S/p R leg ORIF with WBAT 3/30.   Clinical Impression   Patient admitted for the diagnosis and procedure above.  PTA he lives at home and has supportive assist during the days from friends, family and a hired PCA.  Patient admits to frequent falls, use of a RW, supervision for showers, and assist for iADL.  Pain is a limiting factor, currently needing Max A for basic transfers and lower body ADL from a seated position.  Patient is wanting to return home, but he would need 24 hour up to Max A to transition home.  OT will recommend a lower intensity post acute rehab, up to 3 hours/day to maximize his functional status prior to returning home.  OT is indicated in the acute setting to address deficits listed.        Recommendations for follow up therapy are one component of a multi-disciplinary discharge planning process, led by the attending physician.  Recommendations may be updated based on patient status, additional functional criteria and insurance authorization.   Assistance Recommended at Discharge Frequent or constant Supervision/Assistance  Patient can return home with the following Assistance with cooking/housework;Assist for transportation;Help with stairs or ramp for entrance;Two people to help with walking and/or transfers;A lot of help with bathing/dressing/bathroom    Functional Status Assessment  Patient has had a recent decline in their functional status and demonstrates the ability to make significant improvements in function in a reasonable and predictable amount of time.  Equipment Recommendations  None recommended by  OT    Recommendations for Other Services       Precautions / Restrictions Precautions Precautions: Fall Restrictions Weight Bearing Restrictions: Yes RLE Weight Bearing: Weight bearing as tolerated      Mobility Bed Mobility Overal bed mobility: Needs Assistance Bed Mobility: Supine to Sit     Supine to sit: Mod assist, Max assist       Patient Response: Cooperative  Transfers Overall transfer level: Needs assistance Equipment used: Rolling walker (2 wheels) Transfers: Sit to/from Stand Sit to Stand: Max assist   Squat pivot transfers: Max assist              Balance Overall balance assessment: Needs assistance Sitting-balance support: Feet supported, Bilateral upper extremity supported Sitting balance-Leahy Scale: Fair     Standing balance support: Reliant on assistive device for balance Standing balance-Leahy Scale: Poor                             ADL either performed or assessed with clinical judgement   ADL Overall ADL's : Needs assistance/impaired Eating/Feeding: Independent;Sitting   Grooming: Set up;Sitting   Upper Body Bathing: Min guard;Sitting   Lower Body Bathing: Maximal assistance;Sitting/lateral leans   Upper Body Dressing : Min guard;Sitting   Lower Body Dressing: Maximal assistance;Sitting/lateral leans   Toilet Transfer: Maximal assistance Toilet Transfer Details (indicate cue type and reason): bedlevel Toileting- Clothing Manipulation and Hygiene: Total assistance;Bed level               Vision Patient Visual Report: No change from baseline       Perception  Praxis      Pertinent Vitals/Pain Pain Assessment Pain Assessment: Faces Faces Pain Scale: Hurts whole lot Pain Location: R leg Pain Descriptors / Indicators: Guarding, Grimacing, Sharp, Shooting, Operative site guarding Pain Intervention(s): Monitored during session     Hand Dominance Right   Extremity/Trunk Assessment Upper Extremity  Assessment Upper Extremity Assessment: Generalized weakness   Lower Extremity Assessment Lower Extremity Assessment: Defer to PT evaluation   Cervical / Trunk Assessment Cervical / Trunk Assessment: Kyphotic   Communication Communication Communication: No difficulties   Cognition Arousal/Alertness: Awake/alert Behavior During Therapy: WFL for tasks assessed/performed Overall Cognitive Status: Within Functional Limits for tasks assessed Area of Impairment: Memory, Safety/judgement                     Memory: Decreased short-term memory   Safety/Judgement: Decreased awareness of deficits     General Comments: Patient believes he can return home with assist from PCA     General Comments   Desat's on RA    Exercises     Shoulder Instructions      Home Living Family/patient expects to be discharged to:: Private residence Living Arrangements: Alone Available Help at Discharge: Family;Friend(s);Personal care attendant;Available PRN/intermittently Type of Home: House Home Access: Stairs to enter [employer] of Steps: 6 Entrance Stairs-Rails: Right Home Layout: Multi-level Alternate Level Stairs-Number of Steps: 6 + 6.  Patient stating he is now entering the home via a sliding glass door in the rear of the home, and living on this level.  No longer needs to climb stairs for bed/bath.   Bathroom Shower/Tub: Sponge bathes at baseline   Bathroom Toilet: Standard Bathroom Accessibility: Yes   Home Equipment: Grab bars - tub/shower;Rolling Walker (2 wheels);Cane - quad          Prior Functioning/Environment Prior Level of Function : Needs assist;History of Falls (last six months)           ADLs (physical): IADLs Mobility Comments: Household ambulator using RW, uses wheelchair for longer distances ADLs Comments: Indepndent for ADL's except supervision if showering; assisted by friends/family for IADL's.        OT Problem List: Decreased  strength;Decreased activity tolerance;Impaired balance (sitting and/or standing);Decreased range of motion;Decreased safety awareness;Pain;Increased edema      OT Treatment/Interventions: Self-care/ADL training;Therapeutic exercise;Therapeutic activities;Balance training;Patient/family education;DME and/or AE instruction    OT Goals(Current goals can be found in the care plan section) Acute Rehab OT Goals Patient Stated Goal: Return home OT Goal Formulation: With patient Time For Goal Achievement: 09/23/22 Potential to Achieve Goals: Fair ADL Goals Pt Will Perform Lower Body Dressing: with adaptive equipment;sit to/from stand;with mod assist Pt Will Transfer to Toilet: with min assist;stand pivot transfer;bedside commode Pt/caregiver will Perform Home Exercise Program: Increased strength;Both right and left upper extremity;With theraband;With Supervision  OT Frequency: Min 2X/week    Co-evaluation              AM-PAC OT "6 Clicks" Daily Activity     Outcome Measure Help from another person eating meals?: None Help from another person taking care of personal grooming?: None Help from another person toileting, which includes using toliet, bedpan, or urinal?: A Lot Help from another person bathing (including washing, rinsing, drying)?: A Lot Help from another person to put on and taking off regular upper body clothing?: A Lot Help from another person to put on and taking off regular lower body clothing?: A Lot 6 Click Score: 16   End of Session Equipment Utilized  During Treatment: Rolling walker (2 wheels);Oxygen;Gait belt Nurse Communication: Mobility status  Activity Tolerance: Patient limited by pain Patient left: in chair;with call bell/phone within reach;with chair alarm set  OT Visit Diagnosis: Unsteadiness on feet (R26.81);Other abnormalities of gait and mobility (R26.89);Muscle weakness (generalized) (M62.81);Pain;History of falling (Z91.81) Pain - Right/Left: Right Pain  - part of body: Leg;Hip                Time: 1125-1150 OT Time Calculation (min): 25 min Charges:  OT General Charges $OT Visit: 1 Visit OT Evaluation $OT Eval Moderate Complexity: 1 Mod OT Treatments $Self Care/Home Management : 8-22 mins  09/11/2022  RP, OTR/L  Acute Rehabilitation Services  Office:  [phone]   Metta Clines 09/11/2022, 11:59 AM

## 2022-09-11 NOTE — Plan of Care (Signed)

## 2022-09-11 NOTE — Progress Notes (Addendum)
     Subjective:  Patient reports pain as mild.  Pt states he slept well.  Mild swelling causing some discomfort around thigh.  Denies numbness, tingling, weakness.  No other complaints at this time.  Objective:   VITALS:   Vitals:   09/10/22 2030 09/10/22 2353 09/11/22 0540 09/11/22 0743  BP: (!) 102/52 131/62 121/60 134/65  Pulse: 81 77 73 72  Resp: 16  16 16   Temp:   98 F (36.7 C) 97.9 F (36.6 C)  TempSrc:   Oral Oral  SpO2:   96% 100%  Weight:      Height:        Neurovascular intact Sensation intact distally Intact pulses distally Dorsiflexion/Plantar flexion intact Incision: dressing C/D/I No cellulitis present Compartment soft  Lab Results  Component Value Date   WBC 8.7 09/11/2022   HGB 9.1 (L) 09/11/2022   HCT 28.4 (L) 09/11/2022   MCV 90.4 09/11/2022   PLT 199 09/11/2022   BMET    Component Value Date/Time   NA 135 09/11/2022 0459   NA 138 10/07/2021 1202   K 4.7 09/11/2022 0459   CL 96 (L) 09/11/2022 0459   CO2 24 09/11/2022 0459   GLUCOSE 261 (H) 09/11/2022 0459   BUN 37 (H) 09/11/2022 0459   BUN 42 (H) 10/07/2021 1202   CREATININE 4.42 (H) 09/11/2022 0459   CREATININE 1.94 (H) 05/13/2019 1029   CALCIUM 8.0 (L) 09/11/2022 0459   EGFR 22 (L) 10/07/2021 1202   GFRNONAA 14 (L) 09/11/2022 0459    Xray: components in good position, no osseous abnormalities  Assessment/Plan: Right intertrochanteric femur fracture S/P intramedullary nail intertrochanteric 1 Day Post-Op   Principal Problem:   Hip fracture (HCC) Active Problems:   DM type 2 causing vascular disease (HCC)   Chronic combined systolic and diastolic CHF (congestive heart failure) (HCC)   ESRD (end stage renal disease) on dialysis (HCC)   Depression   Paroxysmal supraventricular tachycardia   Hypokalemia   POD1  Post op recs: WB: WBAT RLE Abx: ancef x23 hours post op Imaging: PACU xrays Dressing: keep intact until follow up, change PRN if soiled or saturated. DVT  prophylaxis: lovenox starting POD1 x4 weeks Follow up: 2 weeks after surgery for a wound check with Dr. Zachery Dakins at [facility].    Prince Rome [id number], 8:39 AM   Charlies Constable, MD  Contact information:   [phone] 7am-5pm epic message Dr. Zachery Dakins, or call office for patient follow up: [phone] After hours and holidays please check [email or web address] for group call information for [facility]

## 2022-09-11 NOTE — Evaluation (Signed)
Physical Therapy Evaluation Patient Details Name: [name] MRN: [id number] DOB: 1955-11-04 Today's Date: 09/11/2022  History of Present Illness  Pt is a 67 yo male presenting 3/29 after a fall at home. Pt with hip fx s/p ORIF 3/30. PMH includes: ESRD on HD MWF, CHF, CAD, SVT, DM II, arthritis, HTN, MI, and stroke.   Clinical Impression  Pt in bed upon arrival of PT, agreeable to evaluation at this time. Prior to admission the pt was ambulating with use of RW or using WC in his home, reports he had assist for IADLs from family/friends. The pt now presents with limitations in functional mobility, strength, power, activity tolerance, ROM in RLE, and balance due to above dx, and will continue to benefit from skilled PT to address these deficits. The pt required maxA of 2 to complete sit-stand transfers and pivotal transfer to bed. He is unable to appropriately wt shift or advance RLE due to pain at this time and therefore needs significant assist with all mobility. The pt was also instructed in ROM exercises he can complete in bed to tolerance as he is significantly limited by pain. Will continue to benefit from skilled PT acutely and following d/c, unlikely able to tolerate intensive therapies after d/c at this time.      Recommendations for follow up therapy are one component of a multi-disciplinary discharge planning process, led by the attending physician.  Recommendations may be updated based on patient status, additional functional criteria and insurance authorization.  Follow Up Recommendations Can patient physically be transported by private vehicle: No     Assistance Recommended at Discharge Frequent or constant Supervision/Assistance  Patient can return home with the following  Two people to help with walking and/or transfers;Two people to help with bathing/dressing/bathroom;Assistance with cooking/housework;Direct supervision/assist for medications management;Direct  supervision/assist for financial management;Assist for transportation;Help with stairs or ramp for entrance    Equipment Recommendations None recommended by PT  Recommendations for Other Services       Functional Status Assessment Patient has had a recent decline in their functional status and demonstrates the ability to make significant improvements in function in a reasonable and predictable amount of time.     Precautions / Restrictions Precautions Precautions: Fall Precaution Comments: admitted for fall Restrictions Weight Bearing Restrictions: Yes RLE Weight Bearing: Weight bearing as tolerated      Mobility  Bed Mobility Overal bed mobility: Needs Assistance Bed Mobility: Sit to Supine       Sit to supine: Mod assist   General bed mobility comments: cues for trunk and modA to LE to return to bed, assist of 2 to reposition in bed    Transfers Overall transfer level: Needs assistance Equipment used: Rolling walker (2 wheels) Transfers: Sit to/from Stand, Bed to chair/wheelchair/BSC Sit to Stand: Max assist     Squat pivot transfers: Max assist, +2 physical assistance    Lateral/Scoot Transfers: Max assist General transfer comment: pt with limited hip extension, poor wt acceptance on RLE. unable to advance RLE to step, used squat pivot to transfer BSC to bed, then lateral scoot along EOB to reposition    Ambulation/Gait               General Gait Details: pt unable to advance feet for stepping at this time     Balance Overall balance assessment: Needs assistance Sitting-balance support: Feet supported, Bilateral upper extremity supported Sitting balance-Leahy Scale: Fair Sitting balance - Comments: seated at EOB   Standing balance support:  Reliant on assistive device for balance Standing balance-Leahy Scale: Poor Standing balance comment: using RW                             Pertinent Vitals/Pain Pain Assessment Pain Assessment:  Faces Faces Pain Scale: Hurts whole lot Pain Location: R leg Pain Descriptors / Indicators: Guarding, Grimacing, Sharp, Shooting, Operative site guarding, Moaning Pain Intervention(s): Limited activity within patient's tolerance, Monitored during session, Repositioned, Ice applied    Home Living Family/patient expects to be discharged to:: Private residence Living Arrangements: Alone Available Help at Discharge: Family;Friend(s);Personal care attendant;Available PRN/intermittently Type of Home: House Home Access: Stairs to enter Entrance Stairs-Rails: Right Entrance Stairs-Number of Steps: 6 Alternate Level Stairs-Number of Steps: 6 + 6.  Patient stating he is now entering the home via a sliding glass door in the rear of the home, and living on this level.  No longer needs to climb stairs for bed/bath. Home Layout: Multi-level Home Equipment: Grab bars - tub/shower;Rolling Walker (2 wheels);Cane - quad      Prior Function Prior Level of Function : Needs assist;History of Falls (last six months)           ADLs (physical): IADLs Mobility Comments: Household ambulator using RW, uses wheelchair for longer distances ADLs Comments: Indepndent for ADL's except supervision if showering; assisted by friends/family for IADL's.     Hand Dominance   Dominant Hand: Right    Extremity/Trunk Assessment   Upper Extremity Assessment Upper Extremity Assessment: Defer to OT evaluation    Lower Extremity Assessment Lower Extremity Assessment: RLE deficits/detail RLE Deficits / Details: very limited by pain, pt unable to extend or flex fully due to pain, reports sensation intact RLE: Unable to fully assess due to pain RLE Sensation: WNL RLE Coordination: WNL    Cervical / Trunk Assessment Cervical / Trunk Assessment: Kyphotic  Communication   Communication: No difficulties  Cognition Arousal/Alertness: Awake/alert Behavior During Therapy: WFL for tasks assessed/performed Overall  Cognitive Status: Within Functional Limits for tasks assessed Area of Impairment: Memory, Safety/judgement                     Memory: Decreased short-term memory   Safety/Judgement: Decreased awareness of deficits     General Comments: Patient believes he can return home with assist from [place] comments (skin integrity, edema, etc.): Spo2 to 80s on RA, returned to 90 on 2L    Exercises General Exercises - Lower Extremity Ankle Circles/Pumps: AROM, Both, 10 reps, Supine Quad Sets: AROM, Right, 5 reps, Supine   Assessment/Plan    PT Assessment Patient needs continued PT services  PT Problem List Decreased strength;Decreased activity tolerance;Decreased balance;Decreased mobility;Pain       PT Treatment Interventions DME instruction;Gait training;Stair training;Functional mobility training;Therapeutic activities;Therapeutic exercise;Balance training;Patient/family education    PT Goals (Current goals can be found in the Care Plan section)  Acute Rehab PT Goals Patient Stated Goal: return home PT Goal Formulation: With patient Time For Goal Achievement: 09/25/22 Potential to Achieve Goals: Good    Frequency Min 3X/week        AM-PAC PT "6 Clicks" Mobility  Outcome Measure Help needed turning from your back to your side while in a flat bed without using bedrails?: A Lot Help needed moving from lying on your back to sitting on the side of a flat bed without using bedrails?: A Lot Help needed  moving to and from a bed to a chair (including a wheelchair)?: Total Help needed standing up from a chair using your arms (e.g., wheelchair or bedside chair)?: Total Help needed to walk in hospital room?: Total Help needed climbing 3-5 steps with a railing? : Total 6 Click Score: 8    End of Session Equipment Utilized During Treatment: Gait belt;Oxygen Activity Tolerance: Patient tolerated treatment well;Patient limited by fatigue Patient  left: with call bell/phone within reach;in bed;with bed alarm set Nurse Communication: Mobility status PT Visit Diagnosis: Unsteadiness on feet (R26.81);Other abnormalities of gait and mobility (R26.89);Muscle weakness (generalized) (M62.81);Pain Pain - Right/Left: Right Pain - part of body: Leg    Time: [id number] PT Time Calculation (min) (ACUTE ONLY): 13 min   Charges:   PT Evaluation $PT Eval Low Complexity: 1 Low          West Carbo, PT, DPT   Acute Rehabilitation Department Office [phone] Secure Chat Communication Preferred  Sandra Cockayne 09/11/2022, 3:52 PM

## 2022-09-11 NOTE — Progress Notes (Signed)
Nephrology Follow-Up Consult note   Assessment/Recommendations: [name] is a/an 67 y.o. male with a past medical history significant for CAD, ESRD, DM2, admitted for hip fracture.      Dialysis Orders:  MWF at [facility] 4hr, 400/600, EDW 84.5kg, 2K/2.5Ca, LUE AVF, heparin 5000 unit bolus - Mircera 123mcg IV q 2 weeks (last 3/29) - Getting course of IV iron - No VRDA, last given 3/6 - on hold for low PTH (33)   Assessment/Plan:  R hip intertrochanteric Fx s/p fall: S/p IMN 3/30. Per ortho.  ESRD:  Continue usual MWF schedule for dialysis - next 4/1.   Hypertension/volume: BP stable, no edema on exam. Continue home meds.  Anemia: Hgb 9.1 - not due for ESA yet.  Metabolic bone disease: Ca correct near normal. Obtain phos. Continue home binders.  Nutrition:  Alb low, start protein supplements to assist with healing.  T2DM  CAD, Hx CABG  Recommendations conveyed to primary service.    [facility] 09/11/2022 2:46 PM  ___________________________________________________________  CC: hip fracture  Interval History/Subjective: Patient underwent hip fracture repair yesterday. Some pain today but no other complaints.   Medications:  Current Facility-Administered Medications  Medication Dose Route Frequency Provider Last Rate Last Admin   (feeding supplement) PROSource Plus liquid 30 mL  30 mL Oral BID BM Dorise Bullion M, PA-C   30 mL at 09/11/22 1306   acetaminophen (TYLENOL) tablet 650 mg  650 mg Oral Q6H PRN Dorise Bullion M, PA-C       Or   acetaminophen (TYLENOL) suppository 650 mg  650 mg Rectal Q6H PRN Dorise Bullion M, PA-C       amiodarone (PACERONE) tablet 200 mg  200 mg Oral Daily Dorise Bullion M, PA-C   [id number] mg at 09/11/22 [id number]   Chlorhexidine Gluconate Cloth 2 % PADS 6 each  6 each Topical Q0600 Reesa Chew, MD   6 each at 09/11/22 0645   doxazosin (CARDURA) tablet 2 mg  2 mg Oral Daily Dorise Bullion M, PA-C   2  mg at 09/11/22 0916   [START ON 09/12/2022] enoxaparin (LOVENOX) injection 30 mg  30 mg Subcutaneous Q24H Alma Friendly, MD       hydrALAZINE (APRESOLINE) tablet 50 mg  50 mg Oral TID Dorise Bullion M, PA-C   50 mg at 09/11/22 [id number]   insulin aspart (novoLOG) injection 0-15 Units  0-15 Units Subcutaneous TID WC Prince Rome, PA-C   5 Units at 09/11/22 1213   insulin aspart (novoLOG) injection 0-5 Units  0-5 Units Subcutaneous QHS Prince Rome, PA-C   2 Units at 09/10/22 2034   morphine (PF) 2 MG/ML injection 2 mg  2 mg Intravenous Q2H PRN Prince Rome, PA-C   2 mg at 09/11/22 1214   ondansetron (ZOFRAN) tablet 4 mg  4 mg Oral Q6H PRN Prince Rome, PA-C       Or   ondansetron [facility]) injection 4 mg  4 mg Intravenous Q6H PRN Dorise Bullion M, PA-C       oxyCODONE (Oxy IR/ROXICODONE) immediate release tablet 5-10 mg  5-10 mg Oral Q4H PRN Dorise Bullion M, PA-C   5 mg at 09/10/22 1532   pantoprazole (PROTONIX) EC tablet 40 mg  40 mg Oral BID Prince Rome, PA-C   40 mg at 09/11/22 [id number]   polyethylene glycol (MIRALAX / GLYCOLAX) packet 17 g  17 g Oral Daily PRN Kayleen Memos, DO  senna-docusate (Senokot-S) tablet 1 tablet  1 tablet Oral QHS Irene Pap N, DO   1 tablet at 09/10/22 2032   sertraline (ZOLOFT) tablet 100 mg  100 mg Oral Daily Dorise Bullion M, PA-C   [id number] mg at 09/11/22 [id number]      Review of Systems: 10 systems reviewed and negative except per interval history/subjective  Physical Exam: Vitals:   09/11/22 0540 09/11/22 0743  BP: 121/60 134/65  Pulse: 73 72  Resp: 16 16  Temp: 98 F (36.7 C) 97.9 F (36.6 C)  SpO2: 96% 100%   Total I/O In: 120 [P.O.:120] Out: -   Intake/Output Summary (Last 24 hours) at 09/11/2022 1446 Last data filed at 09/11/2022 0900 Gross per 24 hour  Intake 580 ml  Output 100 ml  Net 480 ml   Constitutional: well-appearing, no acute distress ENMT: ears and nose without scars or lesions,  MMM CV: normal rate, no edema Respiratory: bilateral chest rise, normal work of breathing Gastrointestinal: soft, non-tender, no palpable masses or hernias Skin: no visible lesions or rashes Psych: alert, judgement/insight appropriate, appropriate mood and affect   Test Results I personally reviewed new and old clinical labs and radiology tests Lab Results  Component Value Date   NA 135 09/11/2022   K 4.7 09/11/2022   CL 96 (L) 09/11/2022   CO2 24 09/11/2022   BUN 37 (H) 09/11/2022   CREATININE 4.42 (H) 09/11/2022   CALCIUM 8.0 (L) 09/11/2022   ALBUMIN 3.0 (L) 09/10/2022   PHOS 3.2 08/20/2022    CBC Recent Labs  Lab 09/09/22 1805 09/10/22 0908 09/11/22 0459  WBC 9.1 7.3 8.7  NEUTROABS 7.4 5.3  --   HGB 11.2* 10.9* 9.1*  HCT 35.5* 35.7* 28.4*  MCV 92.2 93.2 90.4  PLT 202 200 199

## 2022-09-12 ENCOUNTER — Other Ambulatory Visit: Payer: Self-pay

## 2022-09-12 DIAGNOSIS — S72001A Fracture of unspecified part of neck of right femur, initial encounter for closed fracture: Secondary | ICD-10-CM | POA: Diagnosis not present

## 2022-09-12 LAB — GLUCOSE, CAPILLARY
Glucose-Capillary: 162 mg/dL — ABNORMAL HIGH (ref 70–99)
Glucose-Capillary: 161 mg/dL — ABNORMAL HIGH (ref 70–99)
Glucose-Capillary: 199 mg/dL — ABNORMAL HIGH (ref 70–99)
Glucose-Capillary: 182 mg/dL — ABNORMAL HIGH (ref 70–99)

## 2022-09-12 LAB — RENAL FUNCTION PANEL
Albumin: 2.9 g/dL — ABNORMAL LOW (ref 3.5–5.0)
Anion gap: 15 (ref 5–15)
BUN: 52 mg/dL — ABNORMAL HIGH (ref 8–23)
CO2: 24 mmol/L (ref 22–32)
Calcium: 8 mg/dL — ABNORMAL LOW (ref 8.9–10.3)
Chloride: 92 mmol/L — ABNORMAL LOW (ref 98–111)
Creatinine, Ser: 5.2 mg/dL — ABNORMAL HIGH (ref 0.61–1.24)
GFR, Estimated: 11 mL/min — ABNORMAL LOW (ref >60)
Glucose, Bld: 237 mg/dL — ABNORMAL HIGH (ref 70–99)
Phosphorus: 4.3 mg/dL (ref 2.5–4.6)
Potassium: 5 mmol/L (ref 3.5–5.1)
Sodium: 131 mmol/L — ABNORMAL LOW (ref 135–145)

## 2022-09-12 LAB — CBC WITH DIFFERENTIAL/PLATELET
Abs Immature Granulocytes: 0.06 10*3/uL (ref 0.00–0.07)
Basophils Absolute: 0.1 10*3/uL (ref 0.0–0.1)
Basophils Relative: 1 %
Eosinophils Absolute: 0.2 10*3/uL (ref 0.0–0.5)
Eosinophils Relative: 3 %
HCT: 27.9 % — ABNORMAL LOW (ref 39.0–52.0)
Hemoglobin: 8.5 g/dL — ABNORMAL LOW (ref 13.0–17.0)
Immature Granulocytes: 1 %
Lymphocytes Relative: 14 %
Lymphs Abs: 1 10*3/uL (ref 0.7–4.0)
MCH: 28.3 pg (ref 26.0–34.0)
MCHC: 30.5 g/dL (ref 30.0–36.0)
MCV: 93 fL (ref 80.0–100.0)
Monocytes Absolute: 0.8 10*3/uL (ref 0.1–1.0)
Monocytes Relative: 12 %
Neutro Abs: 4.8 10*3/uL (ref 1.7–7.7)
Neutrophils Relative %: 69 %
Platelets: 221 10*3/uL (ref 150–400)
RBC: 3 MIL/uL — ABNORMAL LOW (ref 4.22–5.81)
RDW: 18.9 % — ABNORMAL HIGH (ref 11.5–15.5)
WBC: 7 10*3/uL (ref 4.0–10.5)
nRBC: 0 % (ref 0.0–0.2)

## 2022-09-12 MED ORDER — HEPARIN SODIUM (PORCINE) 1000 UNIT/ML DIALYSIS: 1000.0000 [IU] | INTRAMUSCULAR | Status: DC | PRN

## 2022-09-12 MED ORDER — PENTAFLUOROPROP-TETRAFLUOROETH EX AERO: 1.0000 | INHALATION_SPRAY | CUTANEOUS | Status: DC | PRN

## 2022-09-12 MED ORDER — INSULIN GLARGINE-YFGN 100 UNIT/ML ~~LOC~~ SOLN
5.0000 [IU] | Freq: Every day | SUBCUTANEOUS | Status: DC
  Administered 2022-09-12 – 2022-09-17 (×6): 5 [IU] via SUBCUTANEOUS
  Filled 2022-09-12 (×6): qty 0.05

## 2022-09-12 MED ORDER — LIDOCAINE HCL (PF) 1 % IJ SOLN: 5.0000 mL | INTRAMUSCULAR | Status: DC | PRN

## 2022-09-12 MED ORDER — LIDOCAINE-PRILOCAINE 2.5-2.5 % EX CREA: 1.0000 | TOPICAL_CREAM | CUTANEOUS | Status: DC | PRN

## 2022-09-12 NOTE — Progress Notes (Signed)
PT Cancellation Note  Patient Details Name: [name] MRN: [id number] DOB: [birth year]   Cancelled Treatment:    Reason Eval/Treat Not Completed: Patient at procedure or test/unavailable (Pt in HD.  Will return for treatment as able.)   Alvira Philips 09/12/2022, 8:42 AM Trell Secrist M,PT Acute Rehab Services [phone]

## 2022-09-12 NOTE — Progress Notes (Signed)
PT Cancellation Note  Patient Details Name: [name] MRN: [id number] DOB: 03/07/1956   Cancelled Treatment:    Reason Eval/Treat Not Completed: Other (comment) (Pt just back from HD and needed a little rest and pain medications.  Will reattempt as able.)   Alvira Philips 09/12/2022, 2:27 PM Jsaon Yoo M,PT [place] [phone]

## 2022-09-12 NOTE — Inpatient Diabetes Management (Signed)
  Inpatient Diabetes Program Recommendations  AACE/ADA: New Consensus Statement on Inpatient Glycemic Control   Target Ranges:  Prepandial:   less than 140 mg/dL      Peak postprandial:   less than 180 mg/dL (1-2 hours)      Critically ill patients:  140 - 180 mg/dL    Latest Reference Range & Units 09/12/22 03:57  Glucose 70 - 99 mg/dL 237 (H)    Latest Reference Range & Units 09/11/22 07:46 09/11/22 11:56 09/11/22 15:34 09/11/22 20:52  Glucose-Capillary 70 - 99 mg/dL 240 (H) 250 (H) 238 (H) 246 (H)   Review of Glycemic Control  Diabetes history: DM2 Outpatient Diabetes medications: Lantus 8 units daily, Novolog 4-10 units TID with meals Current orders for Inpatient glycemic control: Novolog 0-15 units TID with meals, Novolog 0-5 units QHS  Inpatient Diabetes Program Recommendations:    Insulin: Please consider ordering Semglee 8 units daily.  Thanks, Barnie Alderman, RN, MSN, [place] [facility] Coordinator Inpatient Diabetes Program [phone] (Team Pager from 8am to [place])

## 2022-09-12 NOTE — Progress Notes (Signed)
     Subjective: Patient reports pain as mild.  Worked with physical therapy yesterday.  Was able to transfer to the recliner.  Hemoglobin 8.5 today.  Plan for dialysis today.  Denies distal numbness and tingling.  Objective:   VITALS:   Vitals:   09/11/22 0743 09/11/22 1500 09/11/22 2029 09/12/22 0554  BP: 134/65 (!) 116/58 (!) 110/57 (!) 128/59  Pulse: 72 77 77 75  Resp: 16  16 16   Temp: 97.9 F (36.6 C)  97.7 F (36.5 C) 97.8 F (36.6 C)  TempSrc: Oral  Oral Oral  SpO2: 100% 90% 94% 98%  Weight:      Height:        Neurovascular intact Sensation intact distally Intact pulses distally Dorsiflexion/Plantar flexion intact Incision: dressing C/D/I No cellulitis present Compartment soft  Lab Results  Component Value Date   WBC 7.0 09/12/2022   HGB 8.5 (L) 09/12/2022   HCT 27.9 (L) 09/12/2022   MCV 93.0 09/12/2022   PLT 221 09/12/2022   BMET    Component Value Date/Time   NA 131 (L) 09/12/2022 0357   NA 138 10/07/2021 1202   K 5.0 09/12/2022 0357   CL 92 (L) 09/12/2022 0357   CO2 24 09/12/2022 0357   GLUCOSE 237 (H) 09/12/2022 0357   BUN 52 (H) 09/12/2022 0357   BUN 42 (H) 10/07/2021 1202   CREATININE 5.20 (H) 09/12/2022 0357   CREATININE 1.94 (H) 05/13/2019 1029   CALCIUM 8.0 (L) 09/12/2022 0357   EGFR 22 (L) 10/07/2021 1202   GFRNONAA 11 (L) 09/12/2022 0357    Xray: components in good position, no osseous abnormalities  Assessment/Plan: Right intertrochanteric femur fracture S/P intramedullary nail intertrochanteric 2 Days Post-Op   Principal Problem:   Hip fracture Active Problems:   DM type 2 causing vascular disease   Chronic combined systolic and diastolic CHF (congestive heart failure)   ESRD (end stage renal disease) on dialysis   Depression   Paroxysmal supraventricular tachycardia   Hypokalemia   POD1  Post op recs: WB: WBAT RLE Abx: ancef x23 hours post op Imaging: PACU xrays Dressing: keep intact until follow up, change PRN if  soiled or saturated. DVT prophylaxis: lovenox starting POD1 x4 weeks Follow up: 2 weeks after surgery for a wound check with Dr. Zachery Dakins at [facility].    Trig Mcbryar A [name] 09/12/2022, 7:07 AM   Charlies Constable, MD  Contact information:   [phone] 7am-5pm epic message Dr. Zachery Dakins, or call office for patient follow up: [phone] After hours and holidays please check [email or web address] for group call information for [facility]

## 2022-09-12 NOTE — Progress Notes (Signed)
Subjective: In hemodialysis states pain from hip minimal, tolerating minimal UF on HD  Objective Vital signs in last 24 hours: Vitals:   09/12/22 1015 09/12/22 1045 09/12/22 1115 09/12/22 1145  BP: (!) 129/56 (!) 125/58 (!) 118/56 (!) 125/55  Pulse: 78 78 76 78  Resp: 15 16 17 15   Temp:      TempSrc:      SpO2: 99% 97% 99% 99%  Weight:      Height:       Weight change:   Physical Exam: General: Alert adult male on HD NAD Heart: RRR no MRG Lungs:  CTA non labored breathing Abdomen: NABS soft NT ND Extremities: No pedal edema  Dialysis Access: HD on HD LUA AVF  OP  dialysis Orders:  MWF at [facility] 4hr, 400/600, EDW 84.5kg, 2K/2.5Ca, LUE AVF, heparin 5000 unit bolus - Mircera 147mcg IV q 2 weeks (last 3/29) - Getting course of IV iron - No VRDA, last given 3/6 - on hold for low PTH (33)   Problem/Plan:  R hip intertrochanteric Fx s/p fall: S/p IMN 3/30. Per ortho.  ESRD:  Continue usual MWF schedule for dialysis -  Hypertension/volume: BP stable, no edema on exam. Continue home meds.  Anemia: Hgb 9.1> 8.5- not due for ESA yet.  Metabolic bone disease: Ca correct near normal. Obtain phos. Continue home binders.  Nutrition:  Alb low, start protein supplements to assist with healing.  T2DM  CAD, Hx CABG   Ernest Haber, PA-C [facility] Beeper [phone] 09/12/2022,11:54 AM  LOS: 3 days   Labs: Basic Metabolic Panel: Recent Labs  Lab 09/10/22 0908 09/11/22 0455 09/11/22 0459 09/12/22 0357  NA 136  --  135 131*  K 4.1  --  4.7 5.0  CL 97*  --  96* 92*  CO2 29  --  24 24  GLUCOSE 159*  --  261* 237*  BUN 24*  --  37* 52*  CREATININE 3.56*  --  4.42* 5.20*  CALCIUM 8.3*  --  8.0* 8.0*  PHOS  --  4.1  --  4.3   Liver Function Tests: Recent Labs  Lab 09/10/22 0908 09/12/22 0357  AST 15  --   ALT 9  --   ALKPHOS 88  --   BILITOT 0.9  --   PROT 6.1*  --   ALBUMIN 3.0* 2.9*   No results for input(s): "LIPASE", "AMYLASE" in the last 168 hours. No  results for input(s): "AMMONIA" in the last 168 hours. CBC: Recent Labs  Lab 09/09/22 1805 09/10/22 0908 09/11/22 0459 09/12/22 0357  WBC 9.1 7.3 8.7 7.0  NEUTROABS 7.4 5.3  --  4.8  HGB 11.2* 10.9* 9.1* 8.5*  HCT 35.5* 35.7* 28.4* 27.9*  MCV 92.2 93.2 90.4 93.0  PLT 202 200 199 221   Cardiac Enzymes: No results for input(s): "CKTOTAL", "CKMB", "CKMBINDEX", "TROPONINI" in the last 168 hours. CBG: Recent Labs  Lab 09/10/22 1944 09/11/22 0746 09/11/22 1156 09/11/22 1534 09/11/22 2052  GLUCAP 245* 240* 250* 238* 246*    Studies/Results: DG FEMUR PORT, MIN 2 VIEWS RIGHT  Result Date: 09/10/2022 CLINICAL DATA:  Femur fracture, postop. EXAM: RIGHT FEMUR PORTABLE 2 VIEW COMPARISON:  Preoperative imaging. FINDINGS: Lung intramedullary nail with trans trochanteric and distal locking screw fixation traverse comminuted proximal femur fracture. Improved fracture alignment from preoperative imaging. Recent postsurgical change includes air and edema in the soft tissues. IMPRESSION: ORIF of comminuted proximal femur fracture. No immediate postoperative complication. Electronically Signed  By: Keith Rake M.D.   On: 09/10/2022 15:11   DG FEMUR, MIN 2 VIEWS RIGHT  Result Date: 09/10/2022 CLINICAL DATA:  Proximal right femoral fixation EXAM: RIGHT FEMUR 2 VIEWS COMPARISON:  None Available. FINDINGS: Nine fluoroscopic images obtained during proximal right femoral fixation. 1 minute 53 seconds fluoro time utilized. Radiation dose 16.69 mGy Kerma. Please see performing physicians operative report for full details IMPRESSION: Fluoroscopic images were obtained for intraoperative guidance of proximal right femoral fixation. Electronically Signed   By: Darrin Nipper M.D.   On: 09/10/2022 14:26   DG C-Arm 1-60 Min-No Report  Result Date: 09/10/2022 Fluoroscopy was utilized by the requesting physician.  No radiographic interpretation.   Medications:   (feeding supplement) PROSource Plus  30 mL Oral  BID BM   amiodarone  200 mg Oral Daily   Chlorhexidine Gluconate Cloth  6 each Topical Q0600   doxazosin  2 mg Oral Daily   enoxaparin (LOVENOX) injection  30 mg Subcutaneous Q24H   hydrALAZINE  50 mg Oral TID   insulin aspart  0-15 Units Subcutaneous TID WC   insulin aspart  0-5 Units Subcutaneous QHS   insulin glargine-yfgn  5 Units Subcutaneous Daily   pantoprazole  40 mg Oral BID   senna-docusate  1 tablet Oral QHS   sertraline  100 mg Oral Daily

## 2022-09-12 NOTE — Progress Notes (Signed)
Physical Therapy Treatment Patient Details Name: [name] MRN: [id number] DOB: [birth year] Today's Date: 09/12/2022   History of Present Illness Pt is a 67 yo male presenting 3/29 after a fall at home. Pt with hip fx s/p ORIF 3/30. PMH includes: ESRD on HD MWF, CHF, CAD, SVT, DM II, arthritis, HTN, MI, and stroke.    PT Comments    Pt received in supine, agreeable to therapy session with heavy encouragement. Pt self-limiting due to fatigue after HD session and pain, although pt premedicated with IV pain meds ~10-15 mins prior to session. Pt needing dense multimodal cues for bed mobility and max to totalA for rolling and modA for supine to long sitting from bed chair posture. Pt unable to tolerate upright sitting in bed and refusing to attempt EOB transfer or standing due to pain/fatigue. Pt needing AA for RLE supine therapeutic exercises. Reviewed use of ice to assist with pain relief, pt notified plan for next session will involve sitting EOB and standing/transfer training, pt agreeable. Pt continues to benefit from PT services to progress toward functional mobility goals.   Recommendations for follow up therapy are one component of a multi-disciplinary discharge planning process, led by the attending physician.  Recommendations may be updated based on patient status, additional functional criteria and insurance authorization.  Follow Up Recommendations  Can patient physically be transported by private vehicle: No    Assistance Recommended at Discharge Frequent or constant Supervision/Assistance  Patient can return home with the following Two people to help with walking and/or transfers;Two people to help with bathing/dressing/bathroom;Assistance with cooking/housework;Direct supervision/assist for medications management;Direct supervision/assist for financial management;Assist for transportation;Help with stairs or ramp for entrance   Equipment Recommendations  None recommended by PT  (defer to post-acute setting)    Recommendations for Other Services       Precautions / Restrictions Precautions Precautions: Fall Precaution Comments: admitted for fall; very pain limited Restrictions Weight Bearing Restrictions: Yes RLE Weight Bearing: Weight bearing as tolerated     Mobility  Bed Mobility Overal bed mobility: Needs Assistance Bed Mobility: Rolling Rolling: Max assist, Total assist         General bed mobility comments: rolling to R side then to L side for removal/replacement of dirty bed pad, pt unable to reach full side-lying in either direction due to c/o severe RLE and hip pain. ModA for supine to long sitting briefly with BUE supported by bed rails from elevated trunk posture. Pt unable to maintain due to pain so had him perform x10 bed "crunches" for strengthening core/BUE    Transfers                   General transfer comment: pt defers due to pain/fatigue after HD, despite premedication with IV pain meds    Ambulation/Gait                   Stairs             Wheelchair Mobility    Modified Rankin (Stroke Patients Only)       Balance Overall balance assessment: Needs assistance Sitting-balance support: Bilateral upper extremity supported, Feet unsupported Sitting balance-Leahy Scale: Poor Sitting balance - Comments: supine to long sitting with BUE supported by side rails, pt unable to maintain due to c/o pain and fatigue   Standing balance support: Reliant on assistive device for balance Standing balance-Leahy Scale: Poor Standing balance comment: using RW  Cognition Arousal/Alertness: Awake/alert Behavior During Therapy: WFL for tasks assessed/performed Overall Cognitive Status: Within Functional Limits for tasks assessed Area of Impairment: Memory, Safety/judgement, Attention, Following commands                   Current Attention Level: Focused Memory:  Decreased short-term memory Following Commands: Follows one step commands inconsistently Safety/Judgement: Decreased awareness of deficits     General Comments: Patient believes he can return home with assist from PCA but is very limited with anticipation of pain and refusing edge of bed/OOB mobility during session. Despite multimodal cues, pt often not attemping exercises as instructed, with AA pt able to somewhat attempt exercises but seems to be self-limiting his effort. Pt friend [name] present during session.        Exercises General Exercises - Lower Extremity Ankle Circles/Pumps: AROM, Both, 10 reps, Supine, AAROM (pt inverting/everting ankle, needs AA for DF/PF) Quad Sets: Right, 5 reps, Supine, AAROM (tactile cues for technique) Short Arc Quad: AROM, AAROM, Both, 10 reps, Supine (AA on RLE, AROM on LLE) Heel Slides: AROM, AAROM, Both, 10 reps, Supine (AA on RLE, AROM on LLE (visual cues)) Straight Leg Raises: PROM, Right, Supine (~3 reps while repositioning, pain/effort limiting) Other Exercises Other Exercises: bridges (single leg) with only LLE x 5 reps (cues for posterior pelvic tilt, limited ROM) Other Exercises: bed "crunches" x10 reps from [facility] elevated posture (~30-40*) using BUE, cues for core activation    General Comments General comments (skin integrity, edema, etc.): SpO2 94% on 2L O2 [place] in supine; HR 87 bpm in supine      Pertinent Vitals/Pain Pain Assessment Pain Assessment: Faces Faces Pain Scale: Hurts even more Pain Location: R leg, pt often calling out in anticipation of pain, but when he actively moves his leg, pain seems less than he predicts Pain Descriptors / Indicators: Guarding, Grimacing, Sharp, Operative site guarding, Moaning Pain Intervention(s): Limited activity within patient's tolerance, Monitored during session, Premedicated before session, Repositioned, Ice applied (pt had IV morphine ~15 mins prior)     PT Goals (current goals can now be  found in the care plan section) Acute Rehab PT Goals Patient Stated Goal: return home, less pain PT Goal Formulation: With patient Time For Goal Achievement: 09/25/22 Progress towards PT goals:  (slow progress toward goals)    Frequency    Min 3X/week      PT Plan Current plan remains appropriate       AM-PAC PT "6 Clicks" Mobility   Outcome Measure  Help needed turning from your back to your side while in a flat bed without using bedrails?: A Lot Help needed moving from lying on your back to sitting on the side of a flat bed without using bedrails?: Total Help needed moving to and from a bed to a chair (including a wheelchair)?: Total Help needed standing up from a chair using your arms (e.g., wheelchair or bedside chair)?: Total Help needed to walk in hospital room?: Total Help needed climbing 3-5 steps with a railing? : Total 6 Click Score: 7    End of Session Equipment Utilized During Treatment: Oxygen (2L O2 [place]) Activity Tolerance: Patient limited by pain;Patient limited by fatigue Patient left: with call bell/phone within reach;in bed;with bed alarm set (RLE elevated with heel floated, ice donned to his R hip) Nurse Communication: Mobility status;Other (comment);Patient requests pain meds (pain vs cognition limiting mobility tolerance in bed) PT Visit Diagnosis: Unsteadiness on feet (R26.81);Other abnormalities of gait and mobility (R26.89);Muscle  weakness (generalized) (M62.81);Pain Pain - Right/Left: Right Pain - part of body: Leg     Time: [id number] PT Time Calculation (min) (ACUTE ONLY): 29 min  Charges:  $Therapeutic Exercise: 8-22 mins $Therapeutic Activity: 8-22 mins                     Nikolai Wilczak P., PTA Acute Rehabilitation Services Secure Chat Preferred 9a-5:30pm Office: [employer] 09/12/2022, 4:54 PM

## 2022-09-12 NOTE — Progress Notes (Signed)
PROGRESS NOTE  [name] [id number] DOB: 04-Apr-1956 DOA: 09/09/2022 PCP: [name], MD  HPI/Recap of past 24 hours: [name] is a 67 y.o. male with medical history significant of coronary artery disease, CHF, depression, hypertension, history of stroke, diabetes mellitus type 2, and more who initially presented to [facility], ED after a mechanical fall.  Workup revealed right hip fracture, communicated intertrochanteric fracture of right hip with mildly displaced fragments.  Transferred to [facility] for orthopedic surgery evaluation.  Seen by orthopedic surgery and taken to the OR on 09/10/2022.     Patient denies any new complaints. SNF upon discharge    Assessment/Plan: Principal Problem:   Hip fracture Active Problems:   ESRD (end stage renal disease) on dialysis   Chronic combined systolic and diastolic CHF (congestive heart failure)   Paroxysmal supraventricular tachycardia   Hypokalemia   DM type 2 causing vascular disease   Depression   Right hip fracture (HCC) post mechanical fall, POA s/p IM nail intertrochanteric X-ray shows comminuted intertrochanteric fracture of right hip with mildly displaced fragments Continue pain control, dvt ppx as per orthopedics PT/OT   ESRD (end stage renal disease) on dialysis ([name]) on HD MWF Had hemodialysis on 09/09/2022. Nephrology on board, HD tomorrow   Chronic combined systolic and diastolic CHF (congestive heart failure) ([place]) Euvolemic on exam. Continue metoprolol Resume Repatha at discharge   Paroxysmal supraventricular tachycardia Continue amiodarone Rate is currently controlled   DM type 2 causing vascular disease ([place]) Insulin sliding scale   Depression Continue Zoloft       Advance Care Planning:   Code Status: Full Code   Consults: Nephrology and orthopedics   Family Communication: None at bedside    Status is: Inpatient The patient requires at least 2 midnights for  further evaluation and treatment of present condition.    Objective: Vitals:   09/12/22 1239 09/12/22 1337 09/12/22 1340 09/12/22 1647  BP: (!) 150/62 134/63 134/63 (!) 105/53  Pulse: 79 79  81  Resp: 16 18  18   Temp: 98 F (36.7 C) 98.2 F (36.8 C)  98.3 F (36.8 C)  TempSrc: Oral Oral  Oral  SpO2: 99%   96%  Weight:      Height:        Intake/Output Summary (Last 24 hours) at 09/12/2022 1711 Last data filed at 09/12/2022 1239 Gross per 24 hour  Intake --  Output 3550 ml  Net -3550 ml   Filed Weights   09/09/22 2336 09/12/22 0815 09/12/22 1238  Weight: 84.7 kg 88.6 kg 85.6 kg    Exam: General: NAD  Cardiovascular: S1, S2 present Respiratory: CTAB Abdomen: Soft, nontender, nondistended, bowel sounds present Musculoskeletal: No bilateral pedal edema noted Skin: Normal Psychiatry: Normal mood     Data Reviewed: CBC: Recent Labs  Lab 09/09/22 1805 09/10/22 0908 09/11/22 0459 09/12/22 0357  WBC 9.1 7.3 8.7 7.0  NEUTROABS 7.4 5.3  --  4.8  HGB 11.2* 10.9* 9.1* 8.5*  HCT 35.5* 35.7* 28.4* 27.9*  MCV 92.2 93.2 90.4 93.0  PLT 202 200 199 [id number]   Basic Metabolic Panel: Recent Labs  Lab 09/09/22 1805 09/10/22 0908 09/11/22 0455 09/11/22 0459 09/12/22 0357  NA 134* 136  --  135 131*  K 3.3* 4.1  --  4.7 5.0  CL 95* 97*  --  96* 92*  CO2 27 29  --  24 24  GLUCOSE 240* 159*  --  261* 237*  BUN 17  24*  --  37* 52*  CREATININE 2.63* 3.56*  --  4.42* 5.20*  CALCIUM 8.3* 8.3*  --  8.0* 8.0*  MG  --  3.0*  --  2.9*  --   PHOS  --   --  4.1  --  4.3   GFR: Estimated Creatinine Clearance: 14.4 mL/min (A) (by C-G formula based on SCr of 5.2 mg/dL (H)). Liver Function Tests: Recent Labs  Lab 09/10/22 0908 09/12/22 0357  AST 15  --   ALT 9  --   ALKPHOS 88  --   BILITOT 0.9  --   PROT 6.1*  --   ALBUMIN 3.0* 2.9*   No results for input(s): "LIPASE", "AMYLASE" in the last 168 hours. No results for input(s): "AMMONIA" in the last 168 hours. Coagulation  Profile: No results for input(s): "INR", "PROTIME" in the last 168 hours. Cardiac Enzymes: No results for input(s): "CKTOTAL", "CKMB", "CKMBINDEX", "TROPONINI" in the last 168 hours. BNP (last 3 results) No results for input(s): "PROBNP" in the last 8760 hours. HbA1C: No results for input(s): "HGBA1C" in the last 72 hours. CBG: Recent Labs  Lab 09/11/22 1156 09/11/22 1534 09/11/22 2052 09/12/22 1325 09/12/22 1358  GLUCAP 250* 238* 246* 162* 161*   Lipid Profile: No results for input(s): "CHOL", "HDL", "LDLCALC", "TRIG", "CHOLHDL", "LDLDIRECT" in the last 72 hours. Thyroid Function Tests: No results for input(s): "TSH", "T4TOTAL", "FREET4", "T3FREE", "THYROIDAB" in the last 72 hours. Anemia Panel: No results for input(s): "VITAMINB12", "FOLATE", "FERRITIN", "TIBC", "IRON", "RETICCTPCT" in the last 72 hours. Urine analysis:    Component Value Date/Time   COLORURINE YELLOW 02/28/2022 1414   APPEARANCEUR CLEAR 02/28/2022 1414   LABSPEC 1.014 02/28/2022 1414   PHURINE 5.0 02/28/2022 1414   GLUCOSEU >=500 (A) 02/28/2022 1414   HGBUR NEGATIVE 02/28/2022 1414   BILIRUBINUR NEGATIVE 02/28/2022 1414   KETONESUR NEGATIVE 02/28/2022 1414   PROTEINUR >=300 (A) 02/28/2022 1414   NITRITE NEGATIVE 02/28/2022 1414   LEUKOCYTESUR NEGATIVE 02/28/2022 1414   Sepsis Labs: @LABRCNTIP (procalcitonin:4,lacticidven:4)  ) Recent Results (from the past 240 hour(s))  Surgical pcr screen     Status: None   Collection Time: 09/10/22  1:00 AM   Specimen: Nasal Mucosa; Nasal Swab  Result Value Ref Range Status   MRSA, PCR NEGATIVE NEGATIVE Final   Staphylococcus aureus NEGATIVE NEGATIVE Final    Comment: (NOTE) The Xpert SA Assay (FDA approved for NASAL specimens in patients 9 years of age and older), is one component of a comprehensive surveillance program. It is not intended to diagnose infection nor to guide or monitor treatment. Performed at [facility], [place] [address]  [place], [place] [address]       Studies: No results found.  Scheduled Meds:  (feeding supplement) PROSource Plus  30 mL Oral BID BM   amiodarone  200 mg Oral Daily   Chlorhexidine Gluconate Cloth  6 each Topical Q0600   doxazosin  2 mg Oral Daily   enoxaparin (LOVENOX) injection  30 mg Subcutaneous Q24H   hydrALAZINE  50 mg Oral TID   insulin aspart  0-15 Units Subcutaneous TID WC   insulin aspart  0-5 Units Subcutaneous QHS   insulin glargine-yfgn  5 Units Subcutaneous Daily   pantoprazole  40 mg Oral BID   senna-docusate  1 tablet Oral QHS   sertraline  100 mg Oral Daily    Continuous Infusions:     LOS: 3 days     Alma Friendly, MD [facility]  If 7PM-7AM, please contact night-coverage [email or web address] 09/12/2022, 5:11 PM

## 2022-09-12 NOTE — Progress Notes (Signed)
Received patient in bed to unit.  Alert and oriented.  Informed consent signed and in chart.   TX duration: 4hrs  Patient tolerated well.  Alert, without acute distress.  Hand-off given to patient's nurse.   Access used: L AVF Access issues: None  Total UF removed: 3023ml Medication(s) given: None  Post HD weight: 85.6kg   09/12/22 1239  Vitals  Temp 98 F (36.7 C)  Temp Source Oral  BP (!) 150/62  MAP (mmHg) 85  BP Location Right Arm  BP Method Automatic  Patient Position (if appropriate) Lying  Pulse Rate 79  Pulse Rate Source Monitor  ECG Heart Rate 80  Resp 16  Oxygen Therapy  SpO2 99 %  O2 Device Nasal Cannula  O2 Flow Rate (L/min) 2 L/min  Patient Activity (if Appropriate) In bed  Pulse Oximetry Type Continuous  During Treatment Monitoring  Intra-Hemodialysis Comments Tx completed;Tolerated well  Post Treatment  Dialyzer Clearance Lightly streaked  Duration of HD Treatment -hour(s) 4 hour(s)  Liters Processed 94.5  Fluid Removed (mL) 3000 mL  Tolerated HD Treatment Yes  Post-Hemodialysis Comments Goal met  AVG/AVF Arterial Site Held (minutes) 8 minutes  AVG/AVF Venous Site Held (minutes) 8 minutes  Fistula / Graft Left Forearm Arteriovenous fistula  Placement Date/Time: 12/21/21 1112   Placed prior to admission: No  Orientation: Left  Access Location: Forearm  Access Type: (c) Arteriovenous fistula  Site Condition No complications  Fistula / Graft Assessment Present;Thrill;Bruit  Status Deaccessed     [facility]

## 2022-09-12 NOTE — Progress Notes (Signed)
Pt receives out-pt HD at [facility]) on MWF. Pt arrives at 9:30 for a 9:50 chair time. Will assist as needed.   Melven Sartorius Renal Navigator [phone]

## 2022-09-13 ENCOUNTER — Inpatient Hospital Stay (HOSPITAL_COMMUNITY): Payer: Medicare HMO

## 2022-09-13 DIAGNOSIS — S72001A Fracture of unspecified part of neck of right femur, initial encounter for closed fracture: Secondary | ICD-10-CM | POA: Diagnosis not present

## 2022-09-13 LAB — CBC WITH DIFFERENTIAL/PLATELET
Abs Immature Granulocytes: 0.06 10*3/uL (ref 0.00–0.07)
Basophils Absolute: 0.1 10*3/uL (ref 0.0–0.1)
Basophils Relative: 1 %
Eosinophils Absolute: 0.2 10*3/uL (ref 0.0–0.5)
Eosinophils Relative: 3 %
HCT: 26.5 % — ABNORMAL LOW (ref 39.0–52.0)
Hemoglobin: 8.3 g/dL — ABNORMAL LOW (ref 13.0–17.0)
Immature Granulocytes: 1 %
Lymphocytes Relative: 13 %
Lymphs Abs: 0.9 10*3/uL (ref 0.7–4.0)
MCH: 28.8 pg (ref 26.0–34.0)
MCHC: 31.3 g/dL (ref 30.0–36.0)
MCV: 92 fL (ref 80.0–100.0)
Monocytes Absolute: 0.9 10*3/uL (ref 0.1–1.0)
Monocytes Relative: 12 %
Neutro Abs: 5.3 10*3/uL (ref 1.7–7.7)
Neutrophils Relative %: 70 %
Platelets: 217 10*3/uL (ref 150–400)
RBC: 2.88 MIL/uL — ABNORMAL LOW (ref 4.22–5.81)
RDW: 18.7 % — ABNORMAL HIGH (ref 11.5–15.5)
WBC: 7.3 10*3/uL (ref 4.0–10.5)
nRBC: 0.4 % — ABNORMAL HIGH (ref 0.0–0.2)

## 2022-09-13 LAB — GLUCOSE, CAPILLARY
Glucose-Capillary: 187 mg/dL — ABNORMAL HIGH (ref 70–99)
Glucose-Capillary: 177 mg/dL — ABNORMAL HIGH (ref 70–99)
Glucose-Capillary: 122 mg/dL — ABNORMAL HIGH (ref 70–99)
Glucose-Capillary: 164 mg/dL — ABNORMAL HIGH (ref 70–99)

## 2022-09-13 LAB — RENAL FUNCTION PANEL
Albumin: 2.8 g/dL — ABNORMAL LOW (ref 3.5–5.0)
Anion gap: 12 (ref 5–15)
BUN: 32 mg/dL — ABNORMAL HIGH (ref 8–23)
CO2: 29 mmol/L (ref 22–32)
Calcium: 8.2 mg/dL — ABNORMAL LOW (ref 8.9–10.3)
Chloride: 95 mmol/L — ABNORMAL LOW (ref 98–111)
Creatinine, Ser: 3.62 mg/dL — ABNORMAL HIGH (ref 0.61–1.24)
GFR, Estimated: 18 mL/min — ABNORMAL LOW (ref >60)
Glucose, Bld: 207 mg/dL — ABNORMAL HIGH (ref 70–99)
Phosphorus: 3.1 mg/dL (ref 2.5–4.6)
Potassium: 3.9 mmol/L (ref 3.5–5.1)
Sodium: 136 mmol/L (ref 135–145)

## 2022-09-13 MED ORDER — ORAL CARE MOUTH RINSE: 15.0000 mL | OROMUCOSAL | Status: DC | PRN

## 2022-09-13 MED ORDER — SODIUM CHLORIDE 0.9 % IV SOLN
12.5000 mg | Freq: Three times a day (TID) | INTRAVENOUS | Status: DC | PRN
  Administered 2022-09-13: 12.5 mg via INTRAVENOUS
  Filled 2022-09-13: qty 12.5

## 2022-09-13 MED ORDER — METHOCARBAMOL 500 MG PO TABS
500.0000 mg | ORAL_TABLET | Freq: Three times a day (TID) | ORAL | Status: DC | PRN
  Administered 2022-09-13 – 2022-09-14 (×2): 500 mg via ORAL
  Filled 2022-09-13 (×3): qty 1

## 2022-09-13 NOTE — Progress Notes (Signed)
PT Cancellation Note  Patient Details Name: [name] MRN: [id number] DOB: 02-14-1956   Cancelled Treatment:    Reason Eval/Treat Not Completed: Fatigue/lethargy limiting ability to participate;Other (comment) - pt reports being fatigued, states he is still nauseous even after receiving medication for this. Pt declines PT at this time, requests check back this afternoon.   Stacie Glaze, PT DPT Acute Rehabilitation Services Pager [phone]  Office [phone]    Louis Matte 09/13/2022, 12:56 PM

## 2022-09-13 NOTE — Consult Note (Signed)
   [facility] Inpatient Consult   09/13/2022  [place] 26-Sep-1955 [id number]  [facility] [ACO] Patient: [facility] HMO  *Patient is listed as [employer] SNP*  Primary Care Provider:  [name], MD [facility]  Patient is currently active with [facility] for chronic disease management services.  Patient has been engaged by a [facility] LCSW.  Our community based plan of care has focused on disease management and community resource support for personal care services per notes in encounters.   Plan:  Will alert [facility] LCSW of patient in [employer] SNP also noted. If patient goes to a SNF then post hospital needs will be met at that level of care for needs.  Of note, [facility] Care Management services does not replace or interfere with any services that are needed or arranged by inpatient [facility] care management team.   For additional questions or referrals please contact:  Natividad Brood, RN BSN [place]  [phone] business mobile phone Toll free office [phone]  *[place]  [phone] Fax number: [phone] [email or web address]

## 2022-09-13 NOTE — Progress Notes (Signed)
Subjective: Seen and examined in room, no complaints, said tolerated hemodialysis yesterday  Objective Vital signs in last 24 hours: Vitals:   09/12/22 1647 09/12/22 1952 09/13/22 0505 09/13/22 0801  BP: (!) 105/53 (!) 124/58 (!) 116/56 139/66  Pulse: 81 81 81 78  Resp: 18 17 17 18   Temp: 98.3 F (36.8 C) 98.3 F (36.8 C) 98 F (36.7 C) (!) 97.5 F (36.4 C)  TempSrc: Oral Oral Oral Oral  SpO2: 96% 95% 94% 100%  Weight:      Height:       Weight change:   Physical Exam: General: Alert adult male, NAD Heart: RRR no MRG Lungs:  CTA non labored breathing Abdomen: NABS soft NT ND Extremities: No pedal edema  Dialysis Access: + Bruit LUA AVF   OP  dialysis Orders:  MWF at [facility], 400/600, EDW 84.5kg, 2K/2.5Ca, LUE AVF, heparin 5000 unit bolus - Mircera 149mcg IV q 2 weeks (last 3/29) - Getting course of IV iron - No VRDA, last given 3/6 - on hold for low PTH (33)   Problem/Plan:  R hip intertrochanteric Fx s/p fall: S/p IMN 3/30. Per ortho.  ESRD:  Continue usual MWF schedule for dialysis -  Hypertension/volume: BP stable, no edema on exam. Continue home meds.  Anemia: Hgb 9.1> 8.5-8.3 not due for ESA yet.  Metabolic bone disease: Ca correct near normal. Obtain phos. Continue home binders.  Nutrition:  Alb low, start protein supplements to assist with healing.  T2DM  CAD, Hx CABG   Ernest Haber, PA-C [facility] Beeper [phone] 09/13/2022,10:29 AM  LOS: 4 days   Labs: Basic Metabolic Panel: Recent Labs  Lab 09/11/22 0455 09/11/22 0459 09/12/22 0357 09/13/22 0344  NA  --  135 131* 136  K  --  4.7 5.0 3.9  CL  --  96* 92* 95*  CO2  --  24 24 29   GLUCOSE  --  261* 237* 207*  BUN  --  37* 52* 32*  CREATININE  --  4.42* 5.20* 3.62*  CALCIUM  --  8.0* 8.0* 8.2*  PHOS 4.1  --  4.3 3.1   Liver Function Tests: Recent Labs  Lab 09/10/22 0908 09/12/22 0357 09/13/22 0344  AST 15  --   --   ALT 9  --   --   ALKPHOS 88  --   --   BILITOT 0.9   --   --   PROT 6.1*  --   --   ALBUMIN 3.0* 2.9* 2.8*   No results for input(s): "LIPASE", "AMYLASE" in the last 168 hours. No results for input(s): "AMMONIA" in the last 168 hours. CBC: Recent Labs  Lab 09/09/22 1805 09/10/22 0908 09/11/22 0459 09/12/22 0357 09/13/22 0344  WBC 9.1 7.3 8.7 7.0 7.3  NEUTROABS 7.4 5.3  --  4.8 5.3  HGB 11.2* 10.9* 9.1* 8.5* 8.3*  HCT 35.5* 35.7* 28.4* 27.9* 26.5*  MCV 92.2 93.2 90.4 93.0 92.0  PLT 202 200 199 221 217   Cardiac Enzymes: No results for input(s): "CKTOTAL", "CKMB", "CKMBINDEX", "TROPONINI" in the last 168 hours. CBG: Recent Labs  Lab 09/12/22 1325 09/12/22 1358 09/12/22 1656 09/12/22 1949 09/13/22 0759  GLUCAP 162* 161* 199* 182* 187*    Studies/Results: No results found. Medications:   (feeding supplement) PROSource Plus  30 mL Oral BID BM   amiodarone  200 mg Oral Daily   Chlorhexidine Gluconate Cloth  6 each Topical Q0600   doxazosin  2 mg Oral Daily  enoxaparin (LOVENOX) injection  30 mg Subcutaneous Q24H   hydrALAZINE  50 mg Oral TID   insulin aspart  0-15 Units Subcutaneous TID WC   insulin aspart  0-5 Units Subcutaneous QHS   insulin glargine-yfgn  5 Units Subcutaneous Daily   pantoprazole  40 mg Oral BID   senna-docusate  1 tablet Oral QHS   sertraline  100 mg Oral Daily
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
## 2022-09-13 NOTE — Progress Notes (Signed)
Made CSW and RN CM aware of pt's out-pt HD arrangements prior to admission for snf placement purposes. Will assist as needed.   Melven Sartorius Renal Navigator [phone]

## 2022-09-13 NOTE — Progress Notes (Incomplete)
     Subjective: Patient reports pain as mild.  Worked with physical therapy yesterday.  Was able to transfer to the recliner.  Hemoglobin 8.3 today.  Had dialysis yesterday***.  Denies distal numbness and tingling.  Objective:   VITALS:   Vitals:   09/12/22 1340 09/12/22 1647 09/12/22 1952 09/13/22 0505  BP: 134/63 (!) 105/53 (!) 124/58 (!) 116/56  Pulse:  81 81 81  Resp:  18 17 17   Temp:  98.3 F (36.8 C) 98.3 F (36.8 C) 98 F (36.7 C)  TempSrc:  Oral Oral Oral  SpO2:  96% 95% 94%  Weight:      Height:        Neurovascular intact Sensation intact distally Intact pulses distally Dorsiflexion/Plantar flexion intact Incision: dressing C/D/I No cellulitis present Compartment soft  Lab Results  Component Value Date   WBC 7.3 09/13/2022   HGB 8.3 (L) 09/13/2022   HCT 26.5 (L) 09/13/2022   MCV 92.0 09/13/2022   PLT 217 09/13/2022   BMET    Component Value Date/Time   NA 136 09/13/2022 0344   NA 138 10/07/2021 1202   K 3.9 09/13/2022 0344   CL 95 (L) 09/13/2022 0344   CO2 29 09/13/2022 0344   GLUCOSE 207 (H) 09/13/2022 0344   BUN 32 (H) 09/13/2022 0344   BUN 42 (H) 10/07/2021 1202   CREATININE 3.62 (H) 09/13/2022 0344   CREATININE 1.94 (H) 05/13/2019 1029   CALCIUM 8.2 (L) 09/13/2022 0344   EGFR 22 (L) 10/07/2021 1202   GFRNONAA 18 (L) 09/13/2022 0344    Xray: components in good position, no osseous abnormalities  Assessment/Plan: Right intertrochanteric femur fracture S/P intramedullary nail intertrochanteric 3 Days Post-Op   Principal Problem:   Hip fracture Active Problems:   DM type 2 causing vascular disease   Chronic combined systolic and diastolic CHF (congestive heart failure)   ESRD (end stage renal disease) on dialysis   Depression   Paroxysmal supraventricular tachycardia   Hypokalemia   POD1  Post op recs: WB: WBAT RLE Abx: ancef x23 hours post op Imaging: PACU xrays Dressing: keep intact until follow up, change PRN if soiled or  saturated. DVT prophylaxis: lovenox starting POD1 x4 weeks Follow up: 2 weeks after surgery for a wound check with Dr. Zachery Dakins at [facility].    [name] 0000000, 7:18 AM   Charlies Constable, MD  Contact information:   [phone] 7am-5pm epic message Dr. Zachery Dakins, or call office for patient follow up: [phone] After hours and holidays please check [email or web address] for group call information for [facility]

## 2022-09-13 NOTE — Care Plan (Signed)
Patient AxOx4, vomited in AM. Zofran gave little relief but paged Dr Kendell Bane for Phenergan and to review pain meds because patient says that oxycodone is affecting his mental status and he refuse it. R hip pain elevated, treated with PRN morphine. VS WNL on 2 LO2

## 2022-09-13 NOTE — Progress Notes (Signed)
PROGRESS NOTE  [name] [id number] DOB: 06/29/1955 DOA: 09/09/2022 PCP: [name], MD  HPI/Recap of past 24 hours: [name] is a 67 y.o. male with medical history significant of coronary artery disease, CHF, depression, hypertension, history of stroke, diabetes mellitus type 2, and more who initially presented to [facility], ED after a mechanical fall.  Workup revealed right hip fracture, communicated intertrochanteric fracture of right hip with mildly displaced fragments.  Transferred to [facility] for orthopedic surgery evaluation.  Seen by orthopedic surgery and taken to the OR on 09/10/2022.     Today, pt reported some nausea/vomiting, denies any abdominal pain.  Possibly from ?opioids.  Denies any chest pain, shortness of breath, fever/chills.    Assessment/Plan: Principal Problem:   Hip fracture Active Problems:   ESRD (end stage renal disease) on dialysis   Chronic combined systolic and diastolic CHF (congestive heart failure)   Paroxysmal supraventricular tachycardia   Hypokalemia   DM type 2 causing vascular disease   Depression   Right hip fracture (HCC) post mechanical fall, POA s/p IM nail intertrochanteric X-ray shows comminuted intertrochanteric fracture of right hip with mildly displaced fragments Continue pain control, dvt ppx as per orthopedics PT/OT  N/V Afebrile, no leukocytosis Unclear etiology, may be due to pain meds DG abdominal x-ray pending Antiemetics   ESRD (end stage renal disease) on dialysis ([place]) on HD MWF Had hemodialysis on 09/12/2022,  Nephrology on board, HD tomorrow   Chronic combined systolic and diastolic CHF (congestive heart failure) ([place]) Echo on 08/2022 showed EF of 50%, no regional wall motion abnormality, grade 2 diastolic dysfunction Euvolemic on exam. Continue metoprolol Resume Repatha at discharge   Paroxysmal supraventricular tachycardia Continue amiodarone Rate is currently controlled    DM type 2 causing vascular disease ([place]) Insulin sliding scale   Depression Continue Zoloft       Advance Care Planning:   Code Status: Full Code   Consults: Nephrology and orthopedics   Family Communication: None at bedside    Status is: Inpatient The patient requires at least 2 midnights for further evaluation and treatment of present condition.    Objective: Vitals:   09/13/22 0801 09/13/22 1159 09/13/22 1312 09/13/22 1624  BP: 139/66 (!) 116/54 131/63 (!) 119/57  Pulse: 78 79 80 84  Resp: 18 18  18   Temp: (!) 97.5 F (36.4 C) 98.9 F (37.2 C)  98.6 F (37 C)  TempSrc: Oral Oral    SpO2: 100% 94%  96%  Weight:      Height:        Intake/Output Summary (Last 24 hours) at 09/13/2022 1812 Last data filed at 09/13/2022 1500 Gross per 24 hour  Intake 275 ml  Output 1050 ml  Net -775 ml   Filed Weights   09/09/22 2336 09/12/22 0815 09/12/22 1238  Weight: 84.7 kg 88.6 kg 85.6 kg    Exam: General: NAD  Cardiovascular: S1, S2 present Respiratory: CTAB Abdomen: Soft, nontender, nondistended, bowel sounds present Musculoskeletal: No bilateral pedal edema noted Skin: Normal Psychiatry: Normal mood     Data Reviewed: CBC: Recent Labs  Lab 09/09/22 1805 09/10/22 0908 09/11/22 0459 09/12/22 0357 09/13/22 0344  WBC 9.1 7.3 8.7 7.0 7.3  NEUTROABS 7.4 5.3  --  4.8 5.3  HGB 11.2* 10.9* 9.1* 8.5* 8.3*  HCT 35.5* 35.7* 28.4* 27.9* 26.5*  MCV 92.2 93.2 90.4 93.0 92.0  PLT 202 200 199 221 [id number]   Basic Metabolic Panel: Recent Labs  Lab 09/09/22 1805 09/10/22 0908 09/11/22 0455 09/11/22 0459 09/12/22 0357 09/13/22 0344  NA 134* 136  --  135 131* 136  K 3.3* 4.1  --  4.7 5.0 3.9  CL 95* 97*  --  96* 92* 95*  CO2 27 29  --  24 24 29   GLUCOSE 240* 159*  --  261* 237* 207*  BUN 17 24*  --  37* 52* 32*  CREATININE 2.63* 3.56*  --  4.42* 5.20* 3.62*  CALCIUM 8.3* 8.3*  --  8.0* 8.0* 8.2*  MG  --  3.0*  --  2.9*  --   --   PHOS  --   --  4.1  --  4.3 3.1    GFR: Estimated Creatinine Clearance: 20.7 mL/min (A) (by C-G formula based on SCr of 3.62 mg/dL (H)). Liver Function Tests: Recent Labs  Lab 09/10/22 0908 09/12/22 0357 09/13/22 0344  AST 15  --   --   ALT 9  --   --   ALKPHOS 88  --   --   BILITOT 0.9  --   --   PROT 6.1*  --   --   ALBUMIN 3.0* 2.9* 2.8*   No results for input(s): "LIPASE", "AMYLASE" in the last 168 hours. No results for input(s): "AMMONIA" in the last 168 hours. Coagulation Profile: No results for input(s): "INR", "PROTIME" in the last 168 hours. Cardiac Enzymes: No results for input(s): "CKTOTAL", "CKMB", "CKMBINDEX", "TROPONINI" in the last 168 hours. BNP (last 3 results) No results for input(s): "PROBNP" in the last 8760 hours. HbA1C: No results for input(s): "HGBA1C" in the last 72 hours. CBG: Recent Labs  Lab 09/12/22 1358 09/12/22 1656 09/12/22 1949 09/13/22 0759 09/13/22 1307  GLUCAP 161* 199* 182* 187* 177*   Lipid Profile: No results for input(s): "CHOL", "HDL", "LDLCALC", "TRIG", "CHOLHDL", "LDLDIRECT" in the last 72 hours. Thyroid Function Tests: No results for input(s): "TSH", "T4TOTAL", "FREET4", "T3FREE", "THYROIDAB" in the last 72 hours. Anemia Panel: No results for input(s): "VITAMINB12", "FOLATE", "FERRITIN", "TIBC", "IRON", "RETICCTPCT" in the last 72 hours. Urine analysis:    Component Value Date/Time   COLORURINE YELLOW 02/28/2022 1414   APPEARANCEUR CLEAR 02/28/2022 1414   LABSPEC 1.014 02/28/2022 1414   PHURINE 5.0 02/28/2022 1414   GLUCOSEU >=500 (A) 02/28/2022 1414   HGBUR NEGATIVE 02/28/2022 1414   BILIRUBINUR NEGATIVE 02/28/2022 1414   KETONESUR NEGATIVE 02/28/2022 1414   PROTEINUR >=300 (A) 02/28/2022 1414   NITRITE NEGATIVE 02/28/2022 1414   LEUKOCYTESUR NEGATIVE 02/28/2022 1414   Sepsis Labs: @LABRCNTIP (procalcitonin:4,lacticidven:4)  ) Recent Results (from the past 240 hour(s))  Surgical pcr screen     Status: None   Collection Time: 09/10/22  1:00 AM    Specimen: Nasal Mucosa; Nasal Swab  Result Value Ref Range Status   MRSA, PCR NEGATIVE NEGATIVE Final   Staphylococcus aureus NEGATIVE NEGATIVE Final    Comment: (NOTE) The Xpert SA Assay (FDA approved for NASAL specimens in patients 45 years of age and older), is one component of a comprehensive surveillance program. It is not intended to diagnose infection nor to guide or monitor treatment. Performed at [facility], [place] [address], [place], [place] [address]       Studies: No results found.  Scheduled Meds:  (feeding supplement) PROSource Plus  30 mL Oral BID BM   amiodarone  200 mg Oral Daily   Chlorhexidine Gluconate Cloth  6 each Topical Q0600   doxazosin  2 mg Oral Daily  enoxaparin (LOVENOX) injection  30 mg Subcutaneous Q24H   hydrALAZINE  50 mg Oral TID   insulin aspart  0-15 Units Subcutaneous TID WC   insulin aspart  0-5 Units Subcutaneous QHS   insulin glargine-yfgn  5 Units Subcutaneous Daily   pantoprazole  40 mg Oral BID   senna-docusate  1 tablet Oral QHS   sertraline  100 mg Oral Daily    Continuous Infusions:  promethazine (PHENERGAN) injection (IM or IVPB) 12.5 mg (09/13/22 1357)      LOS: 4 days     Alma Friendly, MD [facility]   If 7PM-7AM, please contact night-coverage [email or web address] 09/13/2022, 6:12 PM

## 2022-09-14 DIAGNOSIS — S72142A Displaced intertrochanteric fracture of left femur, initial encounter for closed fracture: Secondary | ICD-10-CM | POA: Diagnosis not present

## 2022-09-14 LAB — COMPREHENSIVE METABOLIC PANEL
ALT: <5 U/L (ref 0–44)
AST: 8 U/L — ABNORMAL LOW (ref 15–41)
Albumin: 2.6 g/dL — ABNORMAL LOW (ref 3.5–5.0)
Alkaline Phosphatase: 80 U/L (ref 38–126)
Anion gap: 12 (ref 5–15)
BUN: 42 mg/dL — ABNORMAL HIGH (ref 8–23)
CO2: 26 mmol/L (ref 22–32)
Calcium: 8.1 mg/dL — ABNORMAL LOW (ref 8.9–10.3)
Chloride: 97 mmol/L — ABNORMAL LOW (ref 98–111)
Creatinine, Ser: 4.82 mg/dL — ABNORMAL HIGH (ref 0.61–1.24)
GFR, Estimated: 13 mL/min — ABNORMAL LOW (ref >60)
Glucose, Bld: 127 mg/dL — ABNORMAL HIGH (ref 70–99)
Potassium: 4 mmol/L (ref 3.5–5.1)
Sodium: 135 mmol/L (ref 135–145)
Total Bilirubin: 0.7 mg/dL (ref 0.3–1.2)
Total Protein: 5.7 g/dL — ABNORMAL LOW (ref 6.5–8.1)

## 2022-09-14 LAB — CBC WITH DIFFERENTIAL/PLATELET
Abs Immature Granulocytes: 0.04 10*3/uL (ref 0.00–0.07)
Basophils Absolute: 0.1 10*3/uL (ref 0.0–0.1)
Basophils Relative: 1 %
Eosinophils Absolute: 0.2 10*3/uL (ref 0.0–0.5)
Eosinophils Relative: 3 %
HCT: 27.4 % — ABNORMAL LOW (ref 39.0–52.0)
Hemoglobin: 8.4 g/dL — ABNORMAL LOW (ref 13.0–17.0)
Immature Granulocytes: 1 %
Lymphocytes Relative: 12 %
Lymphs Abs: 0.8 10*3/uL (ref 0.7–4.0)
MCH: 28.6 pg (ref 26.0–34.0)
MCHC: 30.7 g/dL (ref 30.0–36.0)
MCV: 93.2 fL (ref 80.0–100.0)
Monocytes Absolute: 0.8 10*3/uL (ref 0.1–1.0)
Monocytes Relative: 11 %
Neutro Abs: 4.7 10*3/uL (ref 1.7–7.7)
Neutrophils Relative %: 72 %
Platelets: 225 10*3/uL (ref 150–400)
RBC: 2.94 MIL/uL — ABNORMAL LOW (ref 4.22–5.81)
RDW: 18.4 % — ABNORMAL HIGH (ref 11.5–15.5)
WBC: 6.6 10*3/uL (ref 4.0–10.5)
nRBC: 0 % (ref 0.0–0.2)

## 2022-09-14 LAB — GLUCOSE, CAPILLARY
Glucose-Capillary: 127 mg/dL — ABNORMAL HIGH (ref 70–99)
Glucose-Capillary: 160 mg/dL — ABNORMAL HIGH (ref 70–99)
Glucose-Capillary: 183 mg/dL — ABNORMAL HIGH (ref 70–99)
Glucose-Capillary: 194 mg/dL — ABNORMAL HIGH (ref 70–99)

## 2022-09-14 MED ORDER — POLYETHYLENE GLYCOL 3350 17 G PO PACK
17.0000 g | PACK | Freq: Two times a day (BID) | ORAL | Status: DC
  Administered 2022-09-14 – 2022-09-16 (×3): 17 g via ORAL
  Filled 2022-09-14 (×3): qty 1

## 2022-09-14 MED ORDER — PENTAFLUOROPROP-TETRAFLUOROETH EX AERO
INHALATION_SPRAY | CUTANEOUS | Status: AC
  Filled 2022-09-14: qty 30

## 2022-09-14 NOTE — TOC Progression Note (Signed)
Transition of Care [facility]) - Progression Note    Patient Details  Name: [name] MRN: [id number] Date of Birth: January 19, 1956  Transition of Care [facility]) CM/SW Contact  Jinger Neighbors, [place] Phone Number: 09/14/2022, 2:05 PM  Clinical Narrative:    CSW met with pt at bedside to discuss switching his dialysis while at SNF to a [place] location; pt was in agreement. CSW Alonza Smoker in dialysis to ask for assistance. CSW also discussed with pt how he gets to dialysis from home and he utilizes public transportation and agrees to [employer]. CSW Jeanella Cara at [facility] to make her aware.    Expected Discharge Plan: [place] Barriers to Discharge: Continued Medical Work up, No SNF bed, SNF Pending bed offer  Expected Discharge Plan and Services       Living arrangements for the past 2 months: Single Family Home                                       Social Determinants of Health ([name]) Interventions [name] Screenings   Food Insecurity: No Food Insecurity (09/09/2022)  Housing: Low Risk  (09/09/2022)  Transportation Needs: Unmet Transportation Needs (09/09/2022)  Utilities: At Risk (09/09/2022)  Alcohol Screen: Low Risk  (04/21/2022)  Depression (PHQ2-9): Medium Risk (09/01/2022)  Financial Resource Strain: Low Risk  (04/21/2022)  Physical Activity: Inactive (04/21/2022)  Social Connections: Unknown (04/21/2022)  Stress: No Stress Concern Present (04/21/2022)  Tobacco Use: Medium Risk (09/11/2022)    Readmission Risk Interventions    08/19/2022    8:11 AM 05/04/2022    9:06 AM 03/01/2022    8:14 AM  Readmission Risk Prevention Plan  Transportation Screening Complete Complete Complete  Medication Review Press photographer) Complete Complete Complete  HRI or Home Care Consult Complete Complete Complete  SW Recovery Care/Counseling Consult Complete Complete Complete  Palliative Care Screening Not Applicable Not Applicable Not [place] Not Applicable Not Applicable Not Applicable

## 2022-09-14 NOTE — Progress Notes (Signed)
PT Cancellation Note  Patient Details Name: [name] MRN: [id number] DOB: 15-Dec-1955   Cancelled Treatment:    Reason Eval/Treat Not Completed: Patient at procedure or test/unavailable.  Gone to procedure and will retry as time and pt allow.   Ramond Dial 09/14/2022, 1:25 PM  Mee Hives, PT PhD Acute Rehab Dept. Number: [place]

## 2022-09-14 NOTE — Progress Notes (Signed)
Subjective:  Seen in HD , Unfortunately AVF infiltrated 25 min into tx, "no Problems in Past" denies sob /has chronic O2   Objective Vital signs in last 24 hours: Vitals:   09/14/22 1002 09/14/22 1010 09/14/22 1040 09/14/22 1106  BP: 117/61 (!) 101/49 131/62 (!) 119/55  Pulse: 83 81 76 76  Resp: 16 19 11 11   Temp: 98.4 F (36.9 C)   98 F (36.7 C)  TempSrc: Oral   Oral  SpO2: 96% 99% 100% 98%  Weight: 86.1 kg     Height:       Weight change:   Physical Exam: General: Alert adult male, NAD Heart: RRR no MRG Lungs:  CTA non labored breathing Abdomen: NABS soft NT ND Extremities: No pedal edema  Dialysis Access: + Bruit LUA AVF with mild swelling sp infiltration    OP  dialysis Orders:  MWF at [place], 400/600, EDW 84.5kg, 2K/2.5Ca, LUE AVF, heparin 5000 unit bolus - Mircera 158mcg IV q 2 weeks (last 3/29) - Getting course of IV iron - No VRDA, last given 3/6 - on hold for low PTH (33)   Problem/Plan:  R hip intertrochanteric Fx s/p fall: S/p IMN 3/30. Per ortho.  ESRD:  Continue usual MWF schedule for dialysis  but AVF infiltrated after 25 min tx today so rest AVF till tomor -  K 4.0 Hypertension/volume: BP stable, no edema on exam. Continue home meds.  Anemia: Hgb 9.1> 8.5-8.3 not due for ESA yet.  Metabolic bone disease: Ca correct 9.2/ phos 3.1. , Ca acetate  binder  hold for now ,No Vit D /fu lab trend   Nutrition:  Alb low, start protein supplements to assist with healing.  T2DM  CAD, Hx CABG    Ernest Haber, PA-C [facility] Beeper [phone] 09/14/2022,11:56 AM  LOS: 5 days   Labs: Basic Metabolic Panel: Recent Labs  Lab 09/11/22 0455 09/11/22 0459 09/12/22 0357 09/13/22 0344 09/14/22 0429  NA  --    < > 131* 136 135  K  --    < > 5.0 3.9 4.0  CL  --    < > 92* 95* 97*  CO2  --    < > 24 29 26   GLUCOSE  --    < > 237* 207* 127*  BUN  --    < > 52* 32* 42*  CREATININE  --    < > 5.20* 3.62* 4.82*  CALCIUM  --    < > 8.0* 8.2* 8.1*   PHOS 4.1  --  4.3 3.1  --    < > = values in this interval not displayed.   Liver Function Tests: Recent Labs  Lab 09/10/22 0908 09/12/22 0357 09/13/22 0344 09/14/22 0429  AST 15  --   --  8*  ALT 9  --   --  <5  ALKPHOS 88  --   --  80  BILITOT 0.9  --   --  0.7  PROT 6.1*  --   --  5.7*  ALBUMIN 3.0* 2.9* 2.8* 2.6*   No results for input(s): "LIPASE", "AMYLASE" in the last 168 hours. No results for input(s): "AMMONIA" in the last 168 hours. CBC: Recent Labs  Lab 09/10/22 0908 09/11/22 0459 09/12/22 0357 09/13/22 0344 09/14/22 0429  WBC 7.3 8.7 7.0 7.3 6.6  NEUTROABS 5.3  --  4.8 5.3 4.7  HGB 10.9* 9.1* 8.5* 8.3* 8.4*  HCT 35.7* 28.4* 27.9* 26.5* 27.4*  MCV 93.2 90.4 93.0  92.0 93.2  PLT 200 199 221 217 225   Cardiac Enzymes: No results for input(s): "CKTOTAL", "CKMB", "CKMBINDEX", "TROPONINI" in the last 168 hours. CBG: Recent Labs  Lab 09/13/22 0759 09/13/22 1307 09/13/22 1816 09/13/22 2033 09/14/22 0831  GLUCAP 187* 177* 122* 164* 127*    Medications:  promethazine (PHENERGAN) injection (IM or IVPB) 12.5 mg (09/13/22 1357)    (feeding supplement) PROSource Plus  30 mL Oral BID BM   amiodarone  200 mg Oral Daily   Chlorhexidine Gluconate Cloth  6 each Topical Q0600   doxazosin  2 mg Oral Daily   enoxaparin (LOVENOX) injection  30 mg Subcutaneous Q24H   hydrALAZINE  50 mg Oral TID   insulin aspart  0-15 Units Subcutaneous TID WC   insulin aspart  0-5 Units Subcutaneous QHS   insulin glargine-yfgn  5 Units Subcutaneous Daily   pantoprazole  40 mg Oral BID   pentafluoroprop-tetrafluoroeth       senna-docusate  1 tablet Oral QHS   sertraline  100 mg Oral Daily

## 2022-09-14 NOTE — Care Management Important Message (Signed)
Important Message  Patient Details  Name: [name] MRN: [id number] Date of Birth: 1955-12-30   Medicare Important Message Given:  Yes     Orbie Pyo 09/14/2022, 2:08 PM

## 2022-09-14 NOTE — Progress Notes (Signed)
Contacted by CSW with request that pt be clipped to a [place] HD clinic while pt at [facility]. Referral submitted to [facility] admissions this afternoon for review for clinic placement in the [place] area. Update provided to renal PA as well. Will assist as needed.   Melven Sartorius Renal Navigator [phone]

## 2022-09-14 NOTE — Progress Notes (Addendum)
Received patient in bed to unit.  Alert and oriented.  Informed consent signed and in chart.   [place] duration:25 Minutes  Patient tolerated well.  Transported back to the room  Alert, without acute distress.  Hand-off given to patient's nurse.   Access used: L AVF Access issues: Line infiltrated. Patient's blood was returned. Ice pack placed on the site to help with swelling. Provider Ernest Haber notified.   Total UF removed: 100ML Medication(s) given: n/a    09/14/22 1106  Vitals  Temp 98 F (36.7 C) (post treatment)  Temp Source Oral  BP (!) 119/55  MAP (mmHg) 74  Pulse Rate 76  ECG Heart Rate 76  Resp 11  Oxygen Therapy  SpO2 98 %  O2 Device Nasal Cannula  O2 Flow Rate (L/min) 2 L/min  During Treatment Monitoring  Intra-Hemodialysis Comments See progress note;Tx completed (line infiltrated. TX ended)  Post Treatment  Dialyzer Clearance Lightly streaked  Duration of HD Treatment -hour(s) 0.25 hour(s)  Hemodialysis Intake (mL) 0 mL  Liters Processed 9.8  Fluid Removed (mL) 100 mL  Tolerated HD Treatment No (Comment)  AVG/AVF Arterial Site Held (minutes) 5 minutes  AVG/AVF Venous Site Held (minutes) 5 minutes  Fistula / Graft Left Forearm Arteriovenous fistula  Placement Date/Time: 12/21/21 1112   Placed prior to admission: No  Orientation: Left  Access Location: Forearm  Access Type: (c) Arteriovenous fistula  Site Condition No complications  Fistula / Graft Assessment Present;[place];Bruit  Status Deaccessed  Needle Size 15  Drainage Description None       [facility]

## 2022-09-14 NOTE — Hospital Course (Signed)
67 y.o. male with medical history significant of coronary artery disease, CHF, depression, hypertension, history of stroke, diabetes mellitus type 2, and more who initially presented to [facility], ED after a mechanical fall.  Workup revealed right hip fracture, communicated intertrochanteric fracture of right hip with mildly displaced fragments.  Transferred to [facility] for orthopedic surgery evaluation.  Seen by orthopedic surgery and taken to the OR on 09/10/2022.

## 2022-09-14 NOTE — Progress Notes (Signed)
  Progress Note   Patient: [name] [id number] DOB: Aug 01, 1955 DOA: 09/09/2022     5 DOS: the patient was seen and examined on 09/14/2022   Brief hospital course: 67 y.o. male with medical history significant of coronary artery disease, CHF, depression, hypertension, history of stroke, diabetes mellitus type 2, and more who initially presented to [facility], ED after a mechanical fall.  Workup revealed right hip fracture, communicated intertrochanteric fracture of right hip with mildly displaced fragments.  Transferred to [facility] for orthopedic surgery evaluation.  Seen by orthopedic surgery and taken to the OR on 09/10/2022.   Assessment and Plan: Right hip fracture ([place]) post mechanical fall, POA s/p IM nail intertrochanteric X-ray shows comminuted intertrochanteric fracture of right hip with mildly displaced fragments Continue with analgesia as needed PT/OT   N/V Afebrile, no leukocytosis Unclear etiology, may be due to pain meds Reviewed abd xray. Findings of increased gas Pt has bowel sounds on exam, reportedly tolerating breakfast this AM Will schedule BID miralax. Continue stimulant laxative   ESRD (end stage renal disease) on dialysis ([place]) on HD MWF Continue HD as tolerated   Chronic combined systolic and diastolic CHF (congestive heart failure) ([place]) Echo on 08/2022 showed EF of 50%, no regional wall motion abnormality, grade 2 diastolic dysfunction Euvolemic on exam. Continue metoprolol Resume Repatha at discharge   Paroxysmal supraventricular tachycardia Continue amiodarone Remains rate controlled   DM type 2 causing vascular disease (HCC) Insulin sliding scale   Depression Continue Zoloft      Subjective: Still without bm since admit. Reports somewhat decreased appetite this AM  Physical Exam: Vitals:   09/14/22 1106 09/14/22 1302 09/14/22 1536 09/14/22 1608  BP: (!) 119/55 (!) 143/67 (!) 115/51 (!) 129/58  Pulse: 76 82 82 80  Resp: 11  16  18   Temp: 98 F (36.7 C) 98.4 F (36.9 C)  (!) 97.4 F (36.3 C)  TempSrc: Oral Oral  Oral  SpO2: 98% 95%  92%  Weight:      Height:       General exam: Awake, laying in bed, in nad Respiratory system: Normal respiratory effort, no wheezing Cardiovascular system: regular rate, s1, s2 Gastrointestinal system: Soft, nondistended, positive BS Central nervous system: CN2-12 grossly intact, strength intact Extremities: Perfused, no clubbing Skin: Normal skin turgor, no notable skin lesions seen Psychiatry: Mood normal // no visual hallucinations   Data Reviewed:  Labs reviewed: Na 135, K 4.0, Cr 4.82, Hgb 8.4  Family Communication: Pt in room, family not at bedside  Disposition: Status is: Inpatient Remains inpatient appropriate because: Severity of illness  Planned Discharge Destination: Skilled nursing facility    Author: Marylu Lund, MD 09/14/2022 4:17 PM  For on call review [email or web address].

## 2022-09-14 NOTE — Care Plan (Signed)
Patient AxOx4, reported severe pain in RLE and covered with PRN Morphine. He refused to ambulate and work with PT saying that he feels weak after dialysis. Surgical site unremarkable, patient having +2 pulses and no N/T. I assisted the NA to give a full bath to patient and do a complete bed change. Dialysis was unsuccessful today and patient will go again tomorrow per dialysis RN.  Q2hrs rounds provided by RN and care plan discussed with patient. Patient encouraged to get up from bed and try to ambulate.

## 2022-09-14 NOTE — Progress Notes (Signed)
Occupational Therapy Treatment Patient Details Name: [name] MRN: [id number] DOB: March 24, 1956 Today's Date: 09/14/2022   History of present illness Pt is a 67 yo male presenting 3/29 after a fall at home. Pt with hip fx s/p ORIF 3/30. PMH includes: ESRD on HD MWF, CHF, CAD, SVT, DM II, arthritis, HTN, MI, and stroke.   OT comments  Patient demonstrated good progress this treatment session with mod assist to get to EOB from mod/max assist. Patient motivated to perform without assistance but continues to be limited due to pain. Patient declined getting into recliner due to possible arrival of transport for HD. Patient performed 2 stands from EOB with max assist with limited standing tolerance. Lateral scooting performed on EOB to progress towards HOB. Patient would benefit from continued OT services to address self care, transfers, and standing tolerance. Acute OT to continue to follow.    Recommendations for follow up therapy are one component of a multi-disciplinary discharge planning process, led by the attending physician.  Recommendations may be updated based on patient status, additional functional criteria and insurance authorization.    Assistance Recommended at Discharge Frequent or constant Supervision/Assistance  Patient can return home with the following  Assistance with cooking/housework;Assist for transportation;Help with stairs or ramp for entrance;Two people to help with walking and/or transfers;A lot of help with bathing/dressing/bathroom   Equipment Recommendations  None recommended by OT    Recommendations for Other Services      Precautions / Restrictions Precautions Precautions: Fall Precaution Comments: admitted for fall; very pain limited Restrictions Weight Bearing Restrictions: Yes RLE Weight Bearing: Weight bearing as tolerated       Mobility Bed Mobility Overal bed mobility: Needs Assistance Bed Mobility: Supine to Sit, Sit to Supine     Supine to  sit: Mod assist Sit to supine: Mod assist   General bed mobility comments: attempting to move RLE without assistance but required assist to complete, assistance with trunk    Transfers Overall transfer level: Needs assistance Equipment used: Rolling walker (2 wheels) Transfers: Sit to/from Stand Sit to Stand: Max assist          Lateral/Scoot Transfers: Mod assist General transfer comment: stood x2 from EOB with max assist to RW and with patient unable to come to complete stand     Balance Overall balance assessment: Needs assistance Sitting-balance support: Bilateral upper extremity supported, Feet unsupported Sitting balance-Leahy Scale: Poor Sitting balance - Comments: min assist for sitting balance intially due to posterior leaning, progressed to supervision Postural control: Posterior lean Standing balance support: Reliant on assistive device for balance Standing balance-Leahy Scale: Poor Standing balance comment: reliant on RW for assistance                           ADL either performed or assessed with clinical judgement   ADL Overall ADL's : Needs assistance/impaired     Grooming: Set up;Sitting Grooming Details (indicate cue type and reason): on EOB                               General ADL Comments: focused on bed mobility and EOB sitting balance    Extremity/Trunk Assessment              Vision       Perception     Praxis      Cognition Arousal/Alertness: Awake/alert Behavior During Therapy: [facility] for tasks  assessed/performed Overall Cognitive Status: Within Functional Limits for tasks assessed Area of Impairment: Memory, Safety/judgement, Attention, Following commands                   Current Attention Level: Focused Memory: Decreased short-term memory Following Commands: Follows one step commands inconsistently Safety/Judgement: Decreased awareness of deficits     General Comments: motivated to attempt bed  mobility and standing without assistance but unable        Exercises      Shoulder Instructions       General Comments VSS on 2 liters O2    Pertinent Vitals/ Pain       Pain Assessment Pain Assessment: Faces Faces Pain Scale: Hurts even more Pain Location: right leg pain, pain increased during session Pain Descriptors / Indicators: Guarding, Grimacing, Sharp, Operative site guarding, Moaning Pain Intervention(s): Limited activity within patient's tolerance, Monitored during session, Repositioned  Home Living                                          Prior Functioning/Environment              Frequency  Min 2X/week        Progress Toward Goals  OT Goals(current goals can now be found in the care plan section)  Progress towards OT goals: Progressing toward goals  Acute Rehab OT Goals Patient Stated Goal: get better OT Goal Formulation: With patient Time For Goal Achievement: 09/23/22 Potential to Achieve Goals: Fair ADL Goals Pt Will Perform Grooming: with modified independence;standing Pt Will Perform Lower Body Bathing: with modified independence;sitting/lateral leans Pt Will Perform Lower Body Dressing: with adaptive equipment;sit to/from stand;with mod assist Pt Will Transfer to Toilet: with min assist;stand pivot transfer;bedside commode Pt/caregiver will Perform Home Exercise Program: Increased strength;Both right and left upper extremity;With theraband;With Supervision  Plan Discharge plan remains appropriate    Co-evaluation                 AM-PAC OT "6 Clicks" Daily Activity     Outcome Measure   Help from another person eating meals?: None Help from another person taking care of personal grooming?: None Help from another person toileting, which includes using toliet, bedpan, or urinal?: A Lot Help from another person bathing (including washing, rinsing, drying)?: A Lot Help from another person to put on and taking off  regular upper body clothing?: A Lot Help from another person to put on and taking off regular lower body clothing?: A Lot 6 Click Score: 16    End of Session Equipment Utilized During Treatment: Gait belt;Rolling walker (2 wheels);Oxygen (2 liters)  OT Visit Diagnosis: Unsteadiness on feet (R26.81);Other abnormalities of gait and mobility (R26.89);Muscle weakness (generalized) (M62.81);Pain;History of falling (Z91.81) Pain - Right/Left: Right Pain - part of body: Leg;Hip   Activity Tolerance Patient limited by pain   Patient Left in bed;with call bell/phone within reach;with bed alarm set   Nurse Communication Mobility status        Time: [id number] OT Time Calculation (min): 30 min  Charges: OT General Charges $OT Visit: 1 Visit OT Treatments $Self Care/Home Management : 8-22 mins $Therapeutic Activity: 8-22 mins  Lodema Hong, [place]  Office [phone]   Trixie Dredge 09/14/2022, 9:42 AM

## 2022-09-14 NOTE — Progress Notes (Addendum)
     Subjective: Patient reports pain as mild-moderate.  POD4 s/p RIGHT intramedullary nail, intertrochanteric.  Hemoglobin 8.4 today, appears stable.  Was unable to participate with PT yesterday, reporting fatigue.  Still WBAT.  Denies distal numbness and tingling.  No other complaints at this time.  Objective:   VITALS:   Vitals:   09/13/22 1624 09/13/22 1818 09/13/22 2022 09/14/22 0507  BP: (!) 119/57 (!) 106/44 (!) 120/56 129/62  Pulse: 84 78 77 79  Resp: 18 18 18    Temp: 98.6 F (37 C) 98 F (36.7 C) 99.4 F (37.4 C) 98.2 F (36.8 C)  TempSrc:   Oral Oral  SpO2: 96% 99% 97% 96%  Weight:      Height:        Neurovascular intact Sensation intact distally Intact pulses distally Dorsiflexion/Plantar flexion intact Incision: dressing C/D/I No cellulitis present Compartment soft  Lab Results  Component Value Date   WBC 6.6 09/14/2022   HGB 8.4 (L) 09/14/2022   HCT 27.4 (L) 09/14/2022   MCV 93.2 09/14/2022   PLT 225 09/14/2022   BMET    Component Value Date/Time   NA 135 09/14/2022 0429   NA 138 10/07/2021 1202   K 4.0 09/14/2022 0429   CL 97 (L) 09/14/2022 0429   CO2 26 09/14/2022 0429   GLUCOSE 127 (H) 09/14/2022 0429   BUN 42 (H) 09/14/2022 0429   BUN 42 (H) 10/07/2021 1202   CREATININE 4.82 (H) 09/14/2022 0429   CREATININE 1.94 (H) 05/13/2019 1029   CALCIUM 8.1 (L) 09/14/2022 0429   EGFR 22 (L) 10/07/2021 1202   GFRNONAA 13 (L) 09/14/2022 0429    Xray: components in good position, no osseous abnormalities  Assessment/Plan: Right intertrochanteric femur fracture S/P intramedullary nail intertrochanteric  4 Days Post-Op   Principal Problem:   Hip fracture Active Problems:   DM type 2 causing vascular disease   Chronic combined systolic and diastolic CHF (congestive heart failure)   ESRD (end stage renal disease) on dialysis   Depression   Paroxysmal supraventricular tachycardia   Hypokalemia   POD4 Hgb appears stable In process of SNF  placement with CSW Rediscussed importance of PT with patient  Post op recs: WB: WBAT RLE Abx: ancef x23 hours post op Imaging: PACU xrays Dressing: keep intact until follow up, change PRN if soiled or saturated. DVT prophylaxis: lovenox starting POD1 x4 weeks Follow up: 2 weeks after surgery for a wound check with Dr. Zachery Dakins at [facility].    [place] [id number], 6:05 AM   Charlies Constable, MD  Contact information:   [phone] 7am-5pm epic message Dr. Zachery Dakins, or call office for patient follow up: [phone] After hours and holidays please check [email or web address] for group call information for [facility]

## 2022-09-15 ENCOUNTER — Encounter: Payer: Self-pay | Admitting: *Deleted

## 2022-09-15 ENCOUNTER — Ambulatory Visit: Payer: Self-pay | Admitting: *Deleted

## 2022-09-15 DIAGNOSIS — S72142A Displaced intertrochanteric fracture of left femur, initial encounter for closed fracture: Secondary | ICD-10-CM | POA: Diagnosis not present

## 2022-09-15 LAB — CBC WITH DIFFERENTIAL/PLATELET
Abs Immature Granulocytes: 0.04 10*3/uL (ref 0.00–0.07)
Basophils Absolute: 0.1 10*3/uL (ref 0.0–0.1)
Basophils Relative: 1 %
Eosinophils Absolute: 0.2 10*3/uL (ref 0.0–0.5)
Eosinophils Relative: 3 %
HCT: 26.3 % — ABNORMAL LOW (ref 39.0–52.0)
Hemoglobin: 8.2 g/dL — ABNORMAL LOW (ref 13.0–17.0)
Immature Granulocytes: 1 %
Lymphocytes Relative: 12 %
Lymphs Abs: 0.9 10*3/uL (ref 0.7–4.0)
MCH: 28.8 pg (ref 26.0–34.0)
MCHC: 31.2 g/dL (ref 30.0–36.0)
MCV: 92.3 fL (ref 80.0–100.0)
Monocytes Absolute: 0.8 10*3/uL (ref 0.1–1.0)
Monocytes Relative: 11 %
Neutro Abs: 5 10*3/uL (ref 1.7–7.7)
Neutrophils Relative %: 72 %
Platelets: 263 10*3/uL (ref 150–400)
RBC: 2.85 MIL/uL — ABNORMAL LOW (ref 4.22–5.81)
RDW: 18.3 % — ABNORMAL HIGH (ref 11.5–15.5)
WBC: 7 10*3/uL (ref 4.0–10.5)
nRBC: 0 % (ref 0.0–0.2)

## 2022-09-15 LAB — COMPREHENSIVE METABOLIC PANEL
ALT: <5 U/L (ref 0–44)
AST: 10 U/L — ABNORMAL LOW (ref 15–41)
Albumin: 2.6 g/dL — ABNORMAL LOW (ref 3.5–5.0)
Alkaline Phosphatase: 87 U/L (ref 38–126)
Anion gap: 13 (ref 5–15)
BUN: 51 mg/dL — ABNORMAL HIGH (ref 8–23)
CO2: 27 mmol/L (ref 22–32)
Calcium: 8.3 mg/dL — ABNORMAL LOW (ref 8.9–10.3)
Chloride: 94 mmol/L — ABNORMAL LOW (ref 98–111)
Creatinine, Ser: 5.38 mg/dL — ABNORMAL HIGH (ref 0.61–1.24)
GFR, Estimated: 11 mL/min — ABNORMAL LOW (ref >60)
Glucose, Bld: 169 mg/dL — ABNORMAL HIGH (ref 70–99)
Potassium: 4.3 mmol/L (ref 3.5–5.1)
Sodium: 134 mmol/L — ABNORMAL LOW (ref 135–145)
Total Bilirubin: 0.8 mg/dL (ref 0.3–1.2)
Total Protein: 5.8 g/dL — ABNORMAL LOW (ref 6.5–8.1)

## 2022-09-15 LAB — GLUCOSE, CAPILLARY
Glucose-Capillary: 193 mg/dL — ABNORMAL HIGH (ref 70–99)
Glucose-Capillary: 145 mg/dL — ABNORMAL HIGH (ref 70–99)
Glucose-Capillary: 235 mg/dL — ABNORMAL HIGH (ref 70–99)

## 2022-09-15 MED ORDER — DOCUSATE SODIUM 100 MG PO CAPS
100.0000 mg | ORAL_CAPSULE | Freq: Two times a day (BID) | ORAL | Status: DC
  Administered 2022-09-15 – 2022-09-16 (×4): 100 mg via ORAL
  Filled 2022-09-15 (×4): qty 1

## 2022-09-15 MED ORDER — BISACODYL 10 MG RE SUPP: 10.0000 mg | Freq: Every day | RECTAL | Status: DC | PRN

## 2022-09-15 MED ORDER — SODIUM CHLORIDE 0.9 % IV SOLN
250.0000 mg | Freq: Every day | INTRAVENOUS | Status: AC
  Administered 2022-09-15 – 2022-09-16 (×2): 250 mg via INTRAVENOUS
  Filled 2022-09-15 (×2): qty 20

## 2022-09-15 NOTE — Progress Notes (Signed)
     Subjective: Patient reports pain as mild-moderate.  POD5 s/p RIGHT intramedullary nail, intertrochanteric.  Hemoglobin 8.2 today, overall stable.  Was unable to participate with PT again today, was at dialysis when they came by.  However was seen by OT, unable to walk though came close to full stand.  Still WBAT.  Denies distal numbness and tingling.  No other complaints at this time.  Objective:   VITALS:   Vitals:   09/14/22 1536 09/14/22 1608 09/14/22 1928 09/15/22 0249  BP: (!) 115/51 (!) 129/58 (!) 106/57 (!) 142/64  Pulse: 82 80 84 81  Resp:  18 18 18   Temp:  (!) 97.4 F (36.3 C) 98.9 F (37.2 C) 97.9 F (36.6 C)  TempSrc:  Oral Oral Oral  SpO2:  92% 97% 97%  Weight:      Height:        Neurovascular intact Sensation intact distally Intact pulses distally Dorsiflexion/Plantar flexion intact Incision: dressing C/D/I No cellulitis present Compartment soft Wiggles toes appropriately  Lab Results  Component Value Date   WBC 7.0 09/15/2022   HGB 8.2 (L) 09/15/2022   HCT 26.3 (L) 09/15/2022   MCV 92.3 09/15/2022   PLT 263 09/15/2022   BMET    Component Value Date/Time   NA 134 (L) 09/15/2022 0457   NA 138 10/07/2021 1202   K 4.3 09/15/2022 0457   CL 94 (L) 09/15/2022 0457   CO2 27 09/15/2022 0457   GLUCOSE 169 (H) 09/15/2022 0457   BUN 51 (H) 09/15/2022 0457   BUN 42 (H) 10/07/2021 1202   CREATININE 5.38 (H) 09/15/2022 0457   CREATININE 1.94 (H) 05/13/2019 1029   CALCIUM 8.3 (L) 09/15/2022 0457   EGFR 22 (L) 10/07/2021 1202   GFRNONAA 11 (L) 09/15/2022 0457    Xray: components in good position, no osseous abnormalities  Assessment/Plan: Right intertrochanteric femur fracture S/P intramedullary nail intertrochanteric  5 Days Post-Op   Principal Problem:   Hip fracture Active Problems:   DM type 2 causing vascular disease   Chronic combined systolic and diastolic CHF (congestive heart failure)   ESRD (end stage renal disease) on dialysis    Depression   Paroxysmal supraventricular tachycardia   Hypokalemia   POD5 Hgb appears stable In process of SNF placement with CSW Rediscussed importance of PT with patient, though fortunately was able to be seen by OT  Post op recs: WB: WBAT RLE Abx: ancef x23 hours post op Imaging: PACU xrays Dressing: keep intact until follow up, change PRN if soiled or saturated. DVT prophylaxis: lovenox starting POD1 x4 weeks Follow up: 2 weeks after surgery for a wound check with Dr[facility]ins at [facility].    [place] [id number], 6:50 AM   Charlies Constable, MD  Contact information:   [phone] 7am-5pm epic message Dr. Zachery Dakins, or call office for patient follow up: [phone] After hours and holidays please check [email or web address] for group call information for [facility]

## 2022-09-15 NOTE — Progress Notes (Signed)
Physical Therapy Treatment Patient Details Name: [name] MRN: [id number] DOB: Oct 27, 1955 Today's Date: 09/15/2022   History of Present Illness Pt is a 67 yo male presenting 3/29 after a fall at home. Pt with hip fx s/p ORIF 3/30. PMH includes: ESRD on HD MWF, CHF, CAD, SVT, DM II, arthritis, HTN, MI, and stroke.    PT Comments    Pt admitted with above diagnosis. Pt able to stand with max assist of 2. but could not pivot to chair.  Used Stedy to move pt to chair.  Pt currently with functional limitations due to balance and endurance deficits. Pt will benefit from acute skilled PT to increase their independence and safety with mobility to allow discharge.      Recommendations for follow up therapy are one component of a multi-disciplinary discharge planning process, led by the attending physician.  Recommendations may be updated based on patient status, additional functional criteria and insurance authorization.  Follow Up Recommendations  Can patient physically be transported by private vehicle: No    Assistance Recommended at Discharge Frequent or constant Supervision/Assistance  Patient can return home with the following Two people to help with walking and/or transfers;Two people to help with bathing/dressing/bathroom;Assistance with cooking/housework;Direct supervision/assist for medications management;Direct supervision/assist for financial management;Assist for transportation;Help with stairs or ramp for entrance   Equipment Recommendations  None recommended by PT (defer to post-acute setting)    Recommendations for Other Services       Precautions / Restrictions Precautions Precautions: Fall Precaution Comments: admitted for fall; very pain limited Restrictions Weight Bearing Restrictions: Yes RLE Weight Bearing: Weight bearing as tolerated     Mobility  Bed Mobility Overal bed mobility: Needs Assistance Bed Mobility: Supine to Sit, Sit to Supine Rolling: Mod  assist   Supine to sit: Mod assist     General bed mobility comments: attempting to move RLE without assistance but required assist to complete, assistance with trunk with pt leaning on right elbow for some time and took incr time and assist to sit up    Transfers Overall transfer level: Needs assistance Equipment used: Rolling walker (2 wheels) Transfers: Sit to/from Stand, Bed to chair/wheelchair/BSC Sit to Stand: Max assist, +2 physical assistance, From elevated surface           General transfer comment: stood x2 from EOB with max assist  +2 to RW and with patient unable to come to complete stand with flexed hips, knees and trunk. Attempts x 2 but pt couldnt take pivotal steps therefore obtained STedy and pt was able to stand to [place]. Moved pt to chair.  Maximove pad in place if needed. Transfer via Lift Equipment: Architect Rankin (Stroke Patients Only)       Balance Overall balance assessment: Needs assistance Sitting-balance support: Bilateral upper extremity supported, Feet unsupported Sitting balance-Leahy Scale: Poor Sitting balance - Comments: min to mod assist for sitting balance intially due to posterior leaning and right lean, progressed to supervision with UE support Postural control: Posterior lean, Right lateral lean Standing balance support: Reliant on assistive device for balance Standing balance-Leahy Scale: Poor Standing balance comment: reliant on RW or Stedy  for assistance  Cognition Arousal/Alertness: Awake/alert Behavior During Therapy: WFL for tasks assessed/performed Overall Cognitive Status: Within Functional Limits for tasks assessed Area of Impairment: Memory, Safety/judgement, Attention, Following commands                   Current Attention Level: Focused Memory: Decreased short-term  memory Following Commands: Follows one step commands inconsistently Safety/Judgement: Decreased awareness of deficits              Exercises General Exercises - Lower Extremity Ankle Circles/Pumps: AROM, Both, 10 reps, Supine, AAROM (pt inverting/everting ankle, needs AA for DF/PF) Quad Sets: Right, 5 reps, Supine, AAROM (tactile cues for technique) Long Arc Quad: AROM, 10 reps, AAROM, Right, Seated    General Comments General comments (skin integrity, edema, etc.): VSS on 2LO2 throughout      Pertinent Vitals/Pain Pain Assessment Pain Assessment: Faces Faces Pain Scale: Hurts whole lot Breathing: normal Negative Vocalization: none Facial Expression: smiling or inexpressive Body Language: relaxed Consolability: no need to console PAINAD Score: 0 Pain Location: right leg pain, pain increased during session Pain Descriptors / Indicators: Guarding, Grimacing, Sharp, Operative site guarding, Moaning Pain Intervention(s): Limited activity within patient's tolerance, Monitored during session, Repositioned    Home Living                          Prior Function            PT Goals (current goals can now be found in the care plan section) Acute Rehab PT Goals Patient Stated Goal: return home, less pain Progress towards PT goals: Progressing toward goals    Frequency    Min 3X/week      PT Plan Current plan remains appropriate    Co-evaluation              AM-PAC PT "6 Clicks" Mobility   Outcome Measure  Help needed turning from your back to your side while in a flat bed without using bedrails?: A Lot Help needed moving from lying on your back to sitting on the side of a flat bed without using bedrails?: Total Help needed moving to and from a bed to a chair (including a wheelchair)?: Total Help needed standing up from a chair using your arms (e.g., wheelchair or bedside chair)?: Total Help needed to walk in hospital room?: Total Help needed  climbing 3-5 steps with a railing? : Total 6 Click Score: 7    End of Session Equipment Utilized During Treatment: Oxygen;Gait belt (2L O2 [place]) Activity Tolerance: Patient limited by pain;Patient limited by fatigue Patient left: with call bell/phone within reach;in chair;with chair alarm set Nurse Communication: Mobility status;Other (comment);Patient requests pain meds (pain vs cognition limiting mobility tolerance in bed) PT Visit Diagnosis: Unsteadiness on feet (R26.81);Other abnormalities of gait and mobility (R26.89);Muscle weakness (generalized) (M62.81);Pain Pain - Right/Left: Right Pain - part of body: Leg     Time: [id number] PT Time Calculation (min) (ACUTE ONLY): 26 min  Charges:  $Therapeutic Exercise: 8-22 mins $Therapeutic Activity: 8-22 mins                     [facility] M,PT Acute Rehab Services [place] 09/15/2022, 1:42 PM

## 2022-09-15 NOTE — Patient Instructions (Signed)
Visit Information  Thank you for taking time to visit with me today. Please don't hesitate to contact me if I can be of assistance to you.   Following are the goals we discussed today:   Goals Addressed               This Visit's Progress     Receive Assistance Applying for [place]. (pt-stated)   On track     Care Coordination Interventions:  Interventions Today    Flowsheet Row Most Recent Value  Chronic Disease   Chronic disease during today's visit Diabetes, Hypertension (HTN), Congestive Heart Failure (CHF), Chronic Kidney Disease/End Stage Renal Disease (ESRD), Other  [Major Depression, Single Episde, Severe]  General Interventions   General Interventions Discussed/Reviewed General Interventions Discussed, General Interventions Reviewed, Annual Eye Exam, Labs, Annual Foot Exam, Lipid Profile, Durable Medical Equipment (DME), Vaccines, Health Screening, [employer], Doctor Visits, Communication with, Level of Care  [facility] Care Provider & Representative with [facility]]  Labs Hgb A1c every 3 months, Kidney Function  Vaccines COVID-19, Flu, Pneumonia, RSV, Shingles  [Encouraged]  Doctor Visits Discussed/Reviewed Doctor Visits Discussed, Doctor Visits Reviewed, Annual Wellness Visits, PCP, Specialist  Health Screening Colonoscopy, Prostate  Durable Medical Equipment (DME) BP Cuff, Glucomoter, Insulin Pump, Oxygen, Walker, Community education officer  PCP/Specialist Visits Compliance with follow-up visit  Communication with PCP/Specialists  Level of Care Applications, Assisted Living, [place]  Exercise Interventions   Exercise Discussed/Reviewed Exercise Discussed, Exercise Reviewed, Physical Activity, Weight Managment, Assistive device use and maintanence  Physical Activity Discussed/Reviewed Physical Activity Discussed, Physical Activity Reviewed, Types of exercise   [Encouraged]  Weight Management Weight maintenance  Education Interventions   Education Provided Provided Engineer, site, Provided Education  Provided Verbal Education On Nutrition, Foot Care, Eye Care, Labs, Blood Sugar Monitoring, Mental Health/Coping with Illness, Applications, Exercise, Medication, When to see the doctor, [place] Discussed, Mental Health Reviewed, Coping Strategies, Crisis, Anxiety, Depression, Suicide  Nutrition Interventions   Nutrition Discussed/Reviewed Nutrition Discussed, Nutrition Reviewed, Adding fruits and vegetables, Fluid intake, Portion sizes, Decreasing sugar intake, Decreasing salt, Decreasing fats, Increaing proteins  Pharmacy Interventions   Pharmacy Dicussed/Reviewed Pharmacy Topics Discussed, Pharmacy Topics Reviewed, Medication Adherence, Affording Medications  Safety Interventions   Safety Discussed/Reviewed Safety Discussed, Safety Reviewed, Fall Risk, Home Safety  Home Herbalist, Refer for community resources, Contact home health agency  Advanced Directive Interventions   Advanced Directives Discussed/Reviewed Advanced Directives Discussed, Advanced Directives Reviewed     Active listening & reflection utilized.  Verbalization of feelings encouraged. Emotional support provided. Feelings of frustration regarding loss of independence validated. Caregiver stress & burnout acknowledged. Caregiver resources reviewed. Caregiver support groups provided. Self-enrollment in caregiver support group of interest emphasized. Problem solving interventions indicated. Task-centered strategies employed. Solution-focused activities activated. Cognitive Behavioral Therapy initiated. Acceptance & Commitment Therapy implemented. Client-Centered Therapy performed. CSW collaboration with caregiver,  [name] to confirm arrangements for patient to receive short-term rehabilitative services at [facility], upon discharge from [place] collaboration with caregiver, [name] to encourage continued outreach attempts to [facility] [phone]), to check status of [place] application. CSW collaboration with caregiver, [name] to encourage patient to decide on at least 3 agencies of interest, from list of [place] Providers & be prepared to provide agencies  of interest to representative from [facility] [phone]), once approved for [place]. CSW collaboration with caregiver, [name] to encourage direct contact with CSW (# [phone]), if she has questions, needs assistance, or if additional social work needs are identified between now & our next scheduled follow-up outreach call.      Our next appointment is by telephone on  09/28/2022 at 11:15 am.  Please call the care guide team at [phone] if you need to cancel or reschedule your appointment.   If you are experiencing a Mental Health or [place] or need someone to talk to, please call the Suicide and Crisis Lifeline: 988 call the [place] National Suicide Prevention Lifeline: (817) 626-2074 or TTY: 763-870-3604 TTY 470-015-1489) to talk to a trained counselor call 1-800-273-TALK (toll free, 24 hour hotline) go to [facility] [address], [place] [phone]) call the [place]: [phone] call 911  Patient verbalizes understanding of instructions and care plan provided today and agrees to view in [place]. Active MyChart status and patient understanding of how to access instructions and care plan via MyChart confirmed with patient.     Telephone follow up appointment with care management team member scheduled for:   09/28/2022 at 11:15  am.  [name], BSW, MSW, [place]  Licensed Clinical Social Worker  [place]  Mailing [place]. [address] [place], [place] [address] Physical Address-[address], [place],  [address] Toll Free Main # [phone] Fax # [phone] Cell # [phone] [email or web address]

## 2022-09-15 NOTE — Progress Notes (Signed)
  Progress Note   Patient: [name] [id number] DOB: 1956-04-09 DOA: 09/09/2022     6 DOS: the patient was seen and examined on 09/15/2022   Brief hospital course: 67 y.o. male with medical history significant of coronary artery disease, CHF, depression, hypertension, history of stroke, diabetes mellitus type 2, and more who initially presented to [facility], ED after a mechanical fall.  Workup revealed right hip fracture, communicated intertrochanteric fracture of right hip with mildly displaced fragments.  Transferred to [facility] for orthopedic surgery evaluation.  Seen by orthopedic surgery and taken to the OR on 09/10/2022.   Assessment and Plan: Right hip fracture ([place]) post mechanical fall, POA s/p IM nail intertrochanteric X-ray shows comminuted intertrochanteric fracture of right hip with mildly displaced fragments Continue with analgesia as needed PT/OT, plan for SNF   N/V Afebrile, no leukocytosis Unclear etiology, may be due to pain meds Reviewed abd xray. Findings of increased gas Pt has bowel sounds on exam, reportedly tolerating breakfast this AM Scheduled BID miralax with stool softner, senna, and PRN dulcolax suppository -If still no result, consider soap suds enema   ESRD (end stage renal disease) on dialysis (HCC) on HD MWF Continue HD as tolerated   Chronic combined systolic and diastolic CHF (congestive heart failure) (HCC) Echo on 08/2022 showed EF of 50%, no regional wall motion abnormality, grade 2 diastolic dysfunction Euvolemic on exam. Continue metoprolol Resume Repatha at discharge   Paroxysmal supraventricular tachycardia Continue amiodarone Remains rate controlled   DM type 2 causing vascular disease (HCC) Insulin sliding scale   Depression Continue Zoloft  Anemia  -Likely secondary to recent surgery -Hgb stable -Ordered dose of IV iron      Subjective: Continues without BM. Tolerating diet  Physical Exam: Vitals:    09/15/22 1404 09/15/22 1415 09/15/22 1432 09/15/22 1532  BP: 113/60 (!) 137/98  134/60  Pulse: 80   80  Resp: 19     Temp: (!) 97.5 F (36.4 C)   97.7 F (36.5 C)  TempSrc: Oral   Oral  SpO2: 96%   98%  Weight:   85 kg   Height:       General exam: Conversant, in no acute distress Respiratory system: normal chest rise, clear, no audible wheezing Cardiovascular system: regular rhythm, s1-s2 Gastrointestinal system: Nondistended, nontender, pos BS Central nervous system: No seizures, no tremors Extremities: No cyanosis, no joint deformities Skin: No rashes, no pallor Psychiatry: Affect normal // no auditory hallucinations   Data Reviewed:  Labs reviewed: na 134, K 4.3, Cr 5.38, Hgb 8.2  Family Communication: Pt in room, family not at bedside  Disposition: Status is: Inpatient Remains inpatient appropriate because: Severity of illness  Planned Discharge Destination: Skilled nursing facility    Author: Marylu Lund, MD 09/15/2022 3:34 PM  For on call review [email or web address].

## 2022-09-15 NOTE — Patient Outreach (Signed)
Care Coordination   Follow Up Visit Note   09/15/2022  Name: [name] MRN: [id number] DOB: 06-10-1956  [name] is a 67 y.o. year old male who sees [name], [name], MD for primary care. I spoke with caregiver, [name] by phone today.  What matters to the patients health and wellness today?   Receive Assistance Applying for [place].   Goals Addressed               This Visit's Progress     Receive Assistance Applying for [place]. (pt-stated)   On track     Care Coordination Interventions:  Interventions Today    Flowsheet Row Most Recent Value  Chronic Disease   Chronic disease during today's visit Diabetes, Hypertension (HTN), Congestive Heart Failure (CHF), Chronic Kidney Disease/End Stage Renal Disease (ESRD), Other  [Major Depression, Single Episde, Severe]  General Interventions   General Interventions Discussed/Reviewed General Interventions Discussed, General Interventions Reviewed, Annual Eye Exam, Labs, Annual Foot Exam, Lipid Profile, Durable Medical Equipment (DME), Vaccines, Health Screening, [employer], Doctor Visits, Communication with, Level of Care  [facility] Care Provider & Representative with [facility]]  Labs Hgb A1c every 3 months, Kidney Function  Vaccines COVID-19, Flu, Pneumonia, RSV, Shingles  [Encouraged]  Doctor Visits Discussed/Reviewed Doctor Visits Discussed, Doctor Visits Reviewed, Annual Wellness Visits, PCP, Specialist  Health Screening Colonoscopy, Prostate  Durable Medical Equipment (DME) BP Cuff, Glucomoter, Insulin Pump, Oxygen, Walker, Community education officer  PCP/Specialist Visits Compliance with follow-up visit  Communication with PCP/Specialists  Level of Care Applications, Assisted Living, [place]  Exercise Interventions   Exercise Discussed/Reviewed Exercise Discussed, Exercise  Reviewed, Physical Activity, Weight Managment, Assistive device use and maintanence  Physical Activity Discussed/Reviewed Physical Activity Discussed, Physical Activity Reviewed, Types of exercise  [Encouraged]  Weight Management Weight maintenance  Education Interventions   Education Provided Provided Engineer, site, Provided Education  Provided Verbal Education On Nutrition, Foot Care, Eye Care, Labs, Blood Sugar Monitoring, Mental Health/Coping with Illness, Applications, Exercise, Medication, When to see the doctor, [place] Discussed, Mental Health Reviewed, Coping Strategies, Crisis, Anxiety, Depression, Suicide  Nutrition Interventions   Nutrition Discussed/Reviewed Nutrition Discussed, Nutrition Reviewed, Adding fruits and vegetables, Fluid intake, Portion sizes, Decreasing sugar intake, Decreasing salt, Decreasing fats, Increaing proteins  Pharmacy Interventions   Pharmacy Dicussed/Reviewed Pharmacy Topics Discussed, Pharmacy Topics Reviewed, Medication Adherence, Affording Medications  Safety Interventions   Safety Discussed/Reviewed Safety Discussed, Safety Reviewed, Fall Risk, Home Safety  Home Herbalist, Refer for community resources, Contact home health agency  Advanced Directive Interventions   Advanced Directives Discussed/Reviewed Advanced Directives Discussed, Advanced Directives Reviewed     Active listening & reflection utilized.  Verbalization of feelings encouraged. Emotional support provided. Feelings of frustration regarding loss of independence validated. Caregiver stress & burnout acknowledged. Caregiver resources reviewed. Caregiver support groups provided. Self-enrollment in caregiver support group of interest emphasized. Problem solving interventions indicated. Task-centered strategies  employed. Solution-focused activities activated. Cognitive Behavioral Therapy initiated. Acceptance & Commitment Therapy implemented. Client-Centered Therapy performed. CSW collaboration with caregiver, [name] to confirm arrangements for patient to receive short-term rehabilitative services at [facility], upon discharge from [place] collaboration with caregiver, [name] to encourage continued outreach attempts to [facility] [phone]), to check status of [place] application.  CSW collaboration with caregiver, [name] to encourage patient to decide on at least 3 agencies of interest, from list of [place] Providers & be prepared to provide agencies of interest to representative from [facility] [phone]), once approved for [place]. CSW collaboration with caregiver, [name] to encourage direct contact with CSW (# [phone]), if she has questions, needs assistance, or if additional social work needs are identified between now & our next scheduled follow-up outreach call.      [name] assessments and interventions completed:  Yes.  Care Coordination Interventions:  Yes, provided.   Follow up plan: Follow up call scheduled for 09/28/2022 at 11:15 am.  Encounter Outcome:  Pt. Visit Completed.   [name], BSW, MSW, LCSW  Licensed Education officer, [facility]  Mailing [place] [address] [place], [place] [address] Physical Address[address] [place], [place] [address] Toll Free Main # [phone] Fax # [phone] Cell # [phone] [name]

## 2022-09-15 NOTE — Progress Notes (Signed)
Received patient in bed to unit.  Alert and oriented.  Informed consent signed and in chart.   [place] duration:3  Patient tolerated well.  Transported back to the room  Alert, without acute distress.  Hand-off given to patient's nurse.   Access used: left AVF Access issues: NONE  Total UF removed: 2L Medication(s) given: MORPHINE   09/15/22 1404  Vitals  Temp (!) 97.5 F (36.4 C)  Temp Source Oral  BP 113/60  MAP (mmHg) 75  BP Location Right Arm  BP Method Automatic  Patient Position (if appropriate) Lying  Pulse Rate 80  Pulse Rate Source Monitor  ECG Heart Rate 80  Resp 19  Oxygen Therapy  SpO2 96 %  O2 Device Nasal Cannula  O2 Flow Rate (L/min) 2 L/min  During Treatment Monitoring  HD Safety Checks Performed Yes  Intra-Hemodialysis Comments Tx completed  Dialysis Fluid Bolus Normal Saline  Bolus Amount (mL) 300 mL      Naasir Carreira S [facility]

## 2022-09-15 NOTE — Progress Notes (Signed)
Subjective:  on HD today 2/2 infiltration  yest ( isolated incident ) No issues today with cannulation and no cos , Noted per SW notes may need Rehab/ SNF  center in [place] area if not avail in [place]  and new kid center in [place]   Objective Vital signs in last 24 hours: Vitals:   09/15/22 1130 09/15/22 1200 09/15/22 1230 09/15/22 1300  BP: (!) 128/54 (!) 128/55 137/62 129/61  Pulse: 80 78 77 77  Resp: 14 17 12 11   Temp:      TempSrc:      SpO2: 94% 97% 98% 97%  Weight:      Height:       Weight change:   Physical Exam: General: Alert adult male, NAD in HD  Heart: RRR no MRG Lungs:  CTA non labored breathing Abdomen: NABS soft NT ND Extremities: No pedal edema  Dialysis Access: LUA AVF patent on HD   OP  dialysis Orders:  MWF at [facility], 400/600, EDW 84.5kg, 2K/2.5Ca, LUE AVF, heparin 5000 unit bolus - Mircera 158mcg IV q 2 weeks (last 3/29) - Getting course of IV iron - No VRDA, last given 3/6 - on hold for low PTH (33)   Problem/Plan:  R hip intertrochanteric Fx s/p fall: S/p IMN 3/30. Per ortho.  ESRD: usual MWF schedule for dialysis  but AVF infiltrated yets 4/03  HD today then SAT. 4/06 Then back To Monday schedule 4/08  Hypertension/volume: BP stable, no edema on exam. Continue home meds.  Anemia: Hgb  8.2  not due for ESA yet.  Metabolic bone disease: Ca correct 9.4/ phos 3.1. , Ca acetate  binder  hold for now ,No Vit D /fu lab trend   Nutrition:  Alb 2.6, start protein supplements to assist with healing.  T2DM  CAD, Hx CABG  Ernest Haber, PA-C [facility] Beeper [phone] 09/15/2022,1:16 PM  LOS: 6 days   Labs: Basic Metabolic Panel: Recent Labs  Lab 09/11/22 0455 09/11/22 0459 09/12/22 0357 09/13/22 0344 09/14/22 0429 09/15/22 0457  NA  --    < > 131* 136 135 134*  K  --    < > 5.0 3.9 4.0 4.3  CL  --    < > 92* 95* 97* 94*  CO2  --    < > 24 29 26 27   GLUCOSE  --    < > 237* 207* 127* 169*  BUN  --    < > 52* 32* 42*  51*  CREATININE  --    < > 5.20* 3.62* 4.82* 5.38*  CALCIUM  --    < > 8.0* 8.2* 8.1* 8.3*  PHOS 4.1  --  4.3 3.1  --   --    < > = values in this interval not displayed.   Liver Function Tests: Recent Labs  Lab 09/10/22 0908 09/12/22 0357 09/13/22 0344 09/14/22 0429 09/15/22 0457  AST 15  --   --  8* 10*  ALT 9  --   --  <5 <5  ALKPHOS 88  --   --  80 87  BILITOT 0.9  --   --  0.7 0.8  PROT 6.1*  --   --  5.7* 5.8*  ALBUMIN 3.0*   < > 2.8* 2.6* 2.6*   < > = values in this interval not displayed.   No results for input(s): "LIPASE", "AMYLASE" in the last 168 hours. No results for input(s): "AMMONIA" in the last 168 hours. CBC:  Recent Labs  Lab 09/11/22 0459 09/12/22 0357 09/13/22 0344 09/14/22 0429 09/15/22 0457  WBC 8.7 7.0 7.3 6.6 7.0  NEUTROABS  --  4.8 5.3 4.7 5.0  HGB 9.1* 8.5* 8.3* 8.4* 8.2*  HCT 28.4* 27.9* 26.5* 27.4* 26.3*  MCV 90.4 93.0 92.0 93.2 92.3  PLT 199 221 217 225 263   Cardiac Enzymes: No results for input(s): "CKTOTAL", "CKMB", "CKMBINDEX", "TROPONINI" in the last 168 hours. CBG: Recent Labs  Lab 09/14/22 0831 09/14/22 1309 09/14/22 1847 09/14/22 2035 09/15/22 0808  GLUCAP 127* 160* 183* 194* 193*    Medications:  promethazine (PHENERGAN) injection (IM or IVPB) 12.5 mg (09/13/22 1357)    (feeding supplement) PROSource Plus  30 mL Oral BID BM   amiodarone  200 mg Oral Daily   Chlorhexidine Gluconate Cloth  6 each Topical Q0600   docusate sodium  100 mg Oral BID   doxazosin  2 mg Oral Daily   enoxaparin (LOVENOX) injection  30 mg Subcutaneous Q24H   hydrALAZINE  50 mg Oral TID   insulin aspart  0-15 Units Subcutaneous TID WC   insulin aspart  0-5 Units Subcutaneous QHS   insulin glargine-yfgn  5 Units Subcutaneous Daily   pantoprazole  40 mg Oral BID   polyethylene glycol  17 g Oral BID   senna-docusate  1 tablet Oral QHS   sertraline  100 mg Oral Daily

## 2022-09-16 DIAGNOSIS — S72142A Displaced intertrochanteric fracture of left femur, initial encounter for closed fracture: Secondary | ICD-10-CM | POA: Diagnosis not present

## 2022-09-16 LAB — CBC WITH DIFFERENTIAL/PLATELET
Abs Immature Granulocytes: 0.05 10*3/uL (ref 0.00–0.07)
Basophils Absolute: 0.1 10*3/uL (ref 0.0–0.1)
Basophils Relative: 1 %
Eosinophils Absolute: 0.3 10*3/uL (ref 0.0–0.5)
Eosinophils Relative: 3 %
HCT: 27.6 % — ABNORMAL LOW (ref 39.0–52.0)
Hemoglobin: 8.8 g/dL — ABNORMAL LOW (ref 13.0–17.0)
Immature Granulocytes: 1 %
Lymphocytes Relative: 12 %
Lymphs Abs: 1 10*3/uL (ref 0.7–4.0)
MCH: 29.1 pg (ref 26.0–34.0)
MCHC: 31.9 g/dL (ref 30.0–36.0)
MCV: 91.4 fL (ref 80.0–100.0)
Monocytes Absolute: 0.8 10*3/uL (ref 0.1–1.0)
Monocytes Relative: 9 %
Neutro Abs: 6.4 10*3/uL (ref 1.7–7.7)
Neutrophils Relative %: 74 %
Platelets: 299 10*3/uL (ref 150–400)
RBC: 3.02 MIL/uL — ABNORMAL LOW (ref 4.22–5.81)
RDW: 18.3 % — ABNORMAL HIGH (ref 11.5–15.5)
WBC: 8.6 10*3/uL (ref 4.0–10.5)
nRBC: 0 % (ref 0.0–0.2)

## 2022-09-16 LAB — GLUCOSE, CAPILLARY
Glucose-Capillary: 208 mg/dL — ABNORMAL HIGH (ref 70–99)
Glucose-Capillary: 266 mg/dL — ABNORMAL HIGH (ref 70–99)
Glucose-Capillary: 116 mg/dL — ABNORMAL HIGH (ref 70–99)
Glucose-Capillary: 173 mg/dL — ABNORMAL HIGH (ref 70–99)

## 2022-09-16 LAB — COMPREHENSIVE METABOLIC PANEL
ALT: <5 U/L (ref 0–44)
AST: 10 U/L — ABNORMAL LOW (ref 15–41)
Albumin: 2.9 g/dL — ABNORMAL LOW (ref 3.5–5.0)
Alkaline Phosphatase: 106 U/L (ref 38–126)
Anion gap: 15 (ref 5–15)
BUN: 40 mg/dL — ABNORMAL HIGH (ref 8–23)
CO2: 24 mmol/L (ref 22–32)
Calcium: 8.7 mg/dL — ABNORMAL LOW (ref 8.9–10.3)
Chloride: 96 mmol/L — ABNORMAL LOW (ref 98–111)
Creatinine, Ser: 4.52 mg/dL — ABNORMAL HIGH (ref 0.61–1.24)
GFR, Estimated: 14 mL/min — ABNORMAL LOW (ref >60)
Glucose, Bld: 238 mg/dL — ABNORMAL HIGH (ref 70–99)
Potassium: 4.2 mmol/L (ref 3.5–5.1)
Sodium: 135 mmol/L (ref 135–145)
Total Bilirubin: 0.7 mg/dL (ref 0.3–1.2)
Total Protein: 6.4 g/dL — ABNORMAL LOW (ref 6.5–8.1)

## 2022-09-16 MED ORDER — BISACODYL 10 MG RE SUPP
10.0000 mg | Freq: Once | RECTAL | Status: AC
  Administered 2022-09-16: 10 mg via RECTAL
  Filled 2022-09-16: qty 1

## 2022-09-16 MED ORDER — PEG 3350-KCL-NABCB-NACL-NASULF 236 G PO SOLR
4000.0000 mL | Freq: Once | ORAL | Status: DC
  Filled 2022-09-16: qty 4000

## 2022-09-16 MED FILL — Morphine Sulfate Inj 4 MG/ML: INTRAMUSCULAR | Qty: 1 | Status: AC

## 2022-09-16 NOTE — Progress Notes (Addendum)
  Progress Note   Patient: [name] [id number] DOB: Jan 27, 1956 DOA: 09/09/2022     7 DOS: the patient was seen and examined on 09/16/2022   Brief hospital course: 67 y.o. male with medical history significant of coronary artery disease, CHF, depression, hypertension, history of stroke, diabetes mellitus type 2, and more who initially presented to [facility], ED after a mechanical fall.  Workup revealed right hip fracture, communicated intertrochanteric fracture of right hip with mildly displaced fragments.  Transferred to [facility] for orthopedic surgery evaluation.  Seen by orthopedic surgery and taken to the OR on 09/10/2022.   Assessment and Plan: Right hip fracture (HCC) post mechanical fall, POA s/p IM nail intertrochanteric X-ray shows comminuted intertrochanteric fracture of right hip with mildly displaced fragments Continue with analgesia as needed PT/OT, plan for SNF   N/V Afebrile, no leukocytosis Unclear etiology, may be due to pain meds Reviewed abd xray. Findings of increased gas Pt has bowel sounds on exam, reportedly tolerating breakfast this AM Scheduled BID miralax with stool softner, senna, and PRN dulcolax suppository -still no results this AM. Pt now agreeable for soap suds enema   ESRD (end stage renal disease) on dialysis (HCC) on HD MWF Continue HD as tolerated   Chronic combined systolic and diastolic CHF (congestive heart failure) (HCC) Echo on 08/2022 showed EF of 50%, no regional wall motion abnormality, grade 2 diastolic dysfunction Euvolemic on exam. Continue metoprolol Resume Repatha at discharge   Paroxysmal supraventricular tachycardia Continue amiodarone Remains rate controlled   DM type 2 causing vascular disease (HCC) Insulin sliding scale   Depression Continue Zoloft  Anemia  -Likely secondary to recent surgery -Hgb stable -Given dose of IV iron      Subjective: Still without BM. Pt now agreeable to enema  Physical  Exam: Vitals:   09/15/22 1532 09/15/22 2018 09/16/22 0442 09/16/22 0852  BP: 134/60 (!) 141/62 (!) 140/67 (!) 137/59  Pulse: 80 82 80 87  Resp:  17 17 18   Temp: 97.7 F (36.5 C) 98.8 F (37.1 C) 98.4 F (36.9 C)   TempSrc: Oral Oral    SpO2: 98% 100% 100% 98%  Weight:      Height:       General exam: Awake, laying in bed, in nad Respiratory system: Normal respiratory effort, no wheezing Cardiovascular system: regular rate, s1, s2 Gastrointestinal system: Soft, nondistended, positive BS Central nervous system: CN2-12 grossly intact, strength intact Extremities: Perfused, no clubbing Skin: Normal skin turgor, no notable skin lesions seen Psychiatry: Mood normal // no visual hallucinations   Data Reviewed:  Labs reviewed: Na 135, K 4.2, Cr 4.52  Family Communication: Pt in room, family currently not at bedside  Disposition: Status is: Inpatient Remains inpatient appropriate because: Severity of illness  Planned Discharge Destination: Skilled nursing facility    Author: Rickey Barbara, MD 09/16/2022 3:11 PM  For on call review [email or web address].

## 2022-09-16 NOTE — Progress Notes (Signed)
Spoke to Kia with [facility] who reports transport has been arranged for HD and they are prepared to admit pt tomorrow pending medical clearance.   Per Kia, pt's outpt HD has been transferred to [facility], MWF 11:30 chair time.   MD updated. SW will follow.   Dellie Burns, MSW, LCSW [phone] (coverage)

## 2022-09-16 NOTE — Progress Notes (Signed)
Physical Therapy Treatment Patient Details Name: [name] MRN: [id number] DOB: 1956-02-24 Today's Date: 09/16/2022   History of Present Illness Pt is a 67 yo male presenting 3/29 after a fall at home. Pt with hip fx s/p ORIF 3/30. PMH includes: ESRD on HD MWF, CHF, CAD, SVT, DM II, arthritis, HTN, MI, and stroke.    PT Comments    Pt admitted with above diagnosis. Pt pain level 6/10 in supine position increasing to 8/10 with weight bearing.  Able to tolerate exercises with hip abduction being most difficult for pt.  Pt with improved bed mobility needing less assist.  Also needing less assist to standing but still needs +2 assist.  Pt declined using RW and wanted to use Stedy. Pt able to stand longer today but still with poor endurance not standing more than 2 attempts.  Progressing toward goals.   Pt currently with functional limitations due to balance and endurance deficits. Pt will benefit from acute skilled PT to increase their independence and safety with mobility to allow discharge.      Recommendations for follow up therapy are one component of a multi-disciplinary discharge planning process, led by the attending physician.  Recommendations may be updated based on patient status, additional functional criteria and insurance authorization.  Follow Up Recommendations  Can patient physically be transported by private vehicle: No    Assistance Recommended at Discharge Frequent or constant Supervision/Assistance  Patient can return home with the following Two people to help with walking and/or transfers;Two people to help with bathing/dressing/bathroom;Assistance with cooking/housework;Direct supervision/assist for medications management;Direct supervision/assist for financial management;Assist for transportation;Help with stairs or ramp for entrance   Equipment Recommendations  None recommended by PT (defer to post-acute setting)    Recommendations for Other Services        Precautions / Restrictions Precautions Precautions: Fall Precaution Comments: admitted for fall; very pain limited Restrictions Weight Bearing Restrictions: Yes RLE Weight Bearing: Weight bearing as tolerated     Mobility  Bed Mobility Overal bed mobility: Needs Assistance Bed Mobility: Supine to Sit, Sit to Supine Rolling: Min assist   Supine to sit: Min assist     General bed mobility comments: attempting to move RLE without assistance but required assist to complete, assistance with trunk with pt leaning on right elbow  and took incr time and assist to sit up    Transfers Overall transfer level: Needs assistance Equipment used: Rolling walker (2 wheels) Transfers: Sit to/from Stand, Bed to chair/wheelchair/BSC Sit to Stand: +2 physical assistance, From elevated surface, Mod assist           General transfer comment: stood from EOB with max assist  +2 to [facility].  Moved pt to chair.  Maximove pad in place if needed.  Once moved to chair, pt worked on standing tolerance but could only stand for 6-8 seconds prior to fatigue.  Could not weight shift effectively. Stood x 2 attempts once moved to chair. Transfer via Lift Equipment: Stedy  Ambulation/Gait                   Stairs             Wheelchair Mobility    Modified Rankin (Stroke Patients Only)       Balance Overall balance assessment: Needs assistance Sitting-balance support: Bilateral upper extremity supported, Feet unsupported Sitting balance-Leahy Scale: Poor Sitting balance - Comments: min  assist for sitting balance  due to posterior leaning and right lean, progressed to supervision  with UE support Postural control: Posterior lean, Right lateral lean Standing balance support: Reliant on assistive device for balance, Bilateral upper extremity supported Standing balance-Leahy Scale: Poor Standing balance comment: reliant on RW or Stedy  for assistance                             Cognition Arousal/Alertness: Awake/alert Behavior During Therapy: WFL for tasks assessed/performed Overall Cognitive Status: Within Functional Limits for tasks assessed                     Current Attention Level: Focused Memory: Decreased short-term memory Following Commands: Follows one step commands consistently Safety/Judgement: Decreased awareness of deficits              Exercises General Exercises - Lower Extremity Ankle Circles/Pumps: AROM, Both, Supine, 20 reps (pt inverting/everting ankle, needs AA for DF/PF) Quad Sets: Right, Supine, AROM, 10 reps (tactile cues for technique) Long Arc Quad: AROM, 10 reps, AAROM, Right, Seated Heel Slides: AAROM, 10 reps, Supine, Right Hip ABduction/ADduction: AAROM, Right, 10 reps, Supine Hip Flexion/Marching: AAROM, Right, 5 reps, Seated    General Comments General comments (skin integrity, edema, etc.): VSS on 2LO2      Pertinent Vitals/Pain Pain Assessment Pain Assessment: Faces Faces Pain Scale: Hurts whole lot Breathing: normal Negative Vocalization: none Facial Expression: smiling or inexpressive Body Language: relaxed Consolability: no need to console PAINAD Score: 0 Pain Location: right leg pain, pain increased during session Pain Descriptors / Indicators: Guarding, Grimacing, Sharp, Operative site guarding, Moaning Pain Intervention(s): Limited activity within patient's tolerance, Monitored during session, Repositioned    Home Living                          Prior Function            PT Goals (current goals can now be found in the care plan section) Acute Rehab PT Goals Patient Stated Goal: return home, less pain Progress towards PT goals: Progressing toward goals    Frequency    Min 3X/week      PT Plan Current plan remains appropriate    Co-evaluation              AM-PAC PT "6 Clicks" Mobility   Outcome Measure  Help needed turning from your back to your side while in  a flat bed without using bedrails?: A Lot Help needed moving from lying on your back to sitting on the side of a flat bed without using bedrails?: Total Help needed moving to and from a bed to a chair (including a wheelchair)?: Total Help needed standing up from a chair using your arms (e.g., wheelchair or bedside chair)?: Total Help needed to walk in hospital room?: Total Help needed climbing 3-5 steps with a railing? : Total 6 Click Score: 7    End of Session Equipment Utilized During Treatment: Oxygen;Gait belt (2L O2 [place]) Activity Tolerance: Patient limited by pain;Patient limited by fatigue Patient left: with call bell/phone within reach;in chair;with chair alarm set Nurse Communication: Mobility status;Patient requests pain meds, IV leaking PT Visit Diagnosis: Unsteadiness on feet (R26.81);Other abnormalities of gait and mobility (R26.89);Muscle weakness (generalized) (M62.81);Pain Pain - Right/Left: Right Pain - part of body: Leg     Time: 0375-4360 PT Time Calculation (min) (ACUTE ONLY): 31 min  Charges:  $Therapeutic Exercise: 8-22 mins $Therapeutic Activity: 8-22 mins  [facility] M,PT Acute Rehab Services [phone]    Bevelyn Buckles 09/16/2022, 12:02 PM

## 2022-09-16 NOTE — Inpatient Diabetes Management (Signed)
Inpatient Diabetes Program Recommendations  AACE/ADA: New Consensus Statement on Inpatient Glycemic Control (2015)  Target Ranges:  Prepandial:   less than 140 mg/dL      Peak postprandial:   less than 180 mg/dL (1-2 hours)      Critically ill patients:  140 - 180 mg/dL   Lab Results  Component Value Date   GLUCAP 266 (H) 09/16/2022   HGBA1C 8.1 (H) 08/18/2022    Review of Glycemic Control  Latest Reference Range & Units 09/15/22 15:32 09/15/22 20:23 09/16/22 07:56 09/16/22 11:42  Glucose-Capillary 70 - 99 mg/dL 115 (H) 520 (H) 802 (H) 266 (H)  (H): Data is abnormally high Diabetes history: DM2 Outpatient Diabetes medications: Lantus 8 units daily, Novolog 4-10 units TID with meals Current orders for Inpatient glycemic control: Novolog 0-15 units TID with meals, Novolog 0-5 units QHS, Semglee 5 units QD   Inpatient Diabetes Program Recommendations:   If to remain inpatient consider ordering Semglee 8 units daily.   Thanks, Lujean Rave, MSN, RNC-OB Diabetes Coordinator [phone] (8a-5p)

## 2022-09-16 NOTE — Progress Notes (Signed)
[facility] Progress Note   Subjective: Seen in room. No events overnight. Denies cp, dyspnea.   Objective Vitals:   09/15/22 1532 09/15/22 2018 09/16/22 0442 09/16/22 0852  BP: 134/60 (!) 141/62 (!) 140/67 (!) 137/59  Pulse: 80 82 80 87  Resp:  17 17 18   Temp: 97.7 F (36.5 C) 98.8 F (37.1 C) 98.4 F (36.9 C)   TempSrc: Oral Oral    SpO2: 98% 100% 100% 98%  Weight:      Height:        85 kg  Additional Objective Labs: Basic Metabolic Panel: Recent Labs  Lab 09/11/22 0455 09/11/22 0459 09/12/22 0357 09/13/22 0344 09/14/22 0429 09/15/22 0457  NA  --    < > 131* 136 135 134*  K  --    < > 5.0 3.9 4.0 4.3  CL  --    < > 92* 95* 97* 94*  CO2  --    < > 24 29 26 27   GLUCOSE  --    < > 237* 207* 127* 169*  BUN  --    < > 52* 32* 42* 51*  CREATININE  --    < > 5.20* 3.62* 4.82* 5.38*  CALCIUM  --    < > 8.0* 8.2* 8.1* 8.3*  PHOS 4.1  --  4.3 3.1  --   --    < > = values in this interval not displayed.   CBC: Recent Labs  Lab 09/11/22 0459 09/12/22 0357 09/13/22 0344 09/14/22 0429 09/15/22 0457  WBC 8.7 7.0 7.3 6.6 7.0  NEUTROABS  --  4.8 5.3 4.7 5.0  HGB 9.1* 8.5* 8.3* 8.4* 8.2*  HCT 28.4* 27.9* 26.5* 27.4* 26.3*  MCV 90.4 93.0 92.0 93.2 92.3  PLT 199 221 217 225 263   Blood Culture    Component Value Date/Time   SDES  08/18/2022 1500    PLEURAL Performed at [facility], [address] [place], [place] [address]    SDES PLEURAL 08/18/2022 1500   SPECREQUEST  08/18/2022 1500    BAA 10CC Performed at [facility], [address], [place], [place] [address]    SPECREQUEST NONE 08/18/2022 1500   CULT  08/18/2022 1500    NO GROWTH 6 DAYS Performed at [facility], [address] [place], [place] [address]    REPTSTATUS 08/24/2022 FINAL 08/18/2022 1500   REPTSTATUS 08/18/2022 FINAL 08/18/2022 1500     Physical Exam General: Alert, nad, on nasal oxygen Heart: RRR  Lungs: Clear bilaterally Abdomen: soft  non-tender Extremities: No LE edema  Dialysis Access: R IJ TDC   Medications:  ferric gluconate (FERRLECIT) IVPB 250 mg (09/16/22 0859)   promethazine (PHENERGAN) injection (IM or IVPB) 12.5 mg (09/13/22 1357)    (feeding supplement) PROSource Plus  30 mL Oral BID BM   amiodarone  200 mg Oral Daily   Chlorhexidine Gluconate Cloth  6 each Topical Q0600   docusate sodium  100 mg Oral BID   doxazosin  2 mg Oral Daily   enoxaparin (LOVENOX) injection  30 mg Subcutaneous Q24H   hydrALAZINE  50 mg Oral TID   insulin aspart  0-15 Units Subcutaneous TID WC   insulin aspart  0-5 Units Subcutaneous QHS   insulin glargine-yfgn  5 Units Subcutaneous Daily   pantoprazole  40 mg Oral BID   polyethylene glycol  17 g Oral BID   senna-docusate  1 tablet Oral QHS   sertraline  100 mg Oral Daily  Dialysis Orders:  MWF at [facility] 4hr, 400/600, EDW 84.5kg, 2K/2.5Ca, LUE AVF, heparin 5000 unit bolus - Mircera IV q 2 weeks (last 3/29) - Getting course of IV iron - No VRDA, last given 3/6 - on hold for low PTH (33)  Assessment/Plan: R hip intertrochanteric Fx s/p fall: S/p IMN 3/30.  ESRD: usual MWF  HD. Off schedule this week d/t infiltration. Next HD Saturday, then back to MWF.   Hypertension/volume: BP stable, no edema on exam. Continue home meds. Anemia: Hgb  8.2  not due for ESA yet. IV Fe ordered here.  Metabolic bone disease: Ca/Phos acceptable. Ca acetate  binder  hold for now  Nutrition:  Alb 2.6, start protein supplements to assist with healing.  T2DM: Insulin per primary   CAD, Hx CABG Dispo: Plan for SNF  Shay Jhaveri Susann Givens PA-C [facility] 09/16/2022,10:27 AM

## 2022-09-16 NOTE — TOC Progression Note (Signed)
Transition of Care [facility]) - Progression Note    Patient Details  Name: [name] MRN: [id number] Date of Birth: 1955-12-31  [facility]) CM/SW Contact  Tory Emerald, [place] Phone Number: 09/16/2022, 2:07 PM  Clinical Narrative:     CSW conversed with Kia at [facility] via text. Kia reports she was able to find out the days for dialysis and working on transportation.   Expected Discharge Plan: Skilled Nursing Facility Barriers to Discharge: Continued Medical Work up, No SNF bed, SNF Pending bed offer  Expected Discharge Plan and Services       Living arrangements for the past 2 months: Single Family Home                                       Social Determinants of Health ([name]) Interventions [name] Screenings   Food Insecurity: No Food Insecurity (09/09/2022)  Housing: Low Risk  (09/09/2022)  Transportation Needs: Unmet Transportation Needs (09/09/2022)  Utilities: At Risk (09/09/2022)  Alcohol Screen: Low Risk  (04/21/2022)  Depression (PHQ2-9): Medium Risk (09/01/2022)  Financial Resource Strain: Low Risk  (04/21/2022)  Physical Activity: Inactive (04/21/2022)  Social Connections: Unknown (04/21/2022)  Stress: No Stress Concern Present (04/21/2022)  Tobacco Use: Medium Risk (09/15/2022)    Readmission Risk Interventions    08/19/2022    8:11 AM 05/04/2022    9:06 AM 03/01/2022    8:14 AM  Readmission Risk Prevention Plan  Transportation Screening Complete Complete Complete  Medication Review Oceanographer) Complete Complete Complete  HRI or Home Care Consult Complete Complete Complete  SW Recovery Care/Counseling Consult Complete Complete Complete  Palliative Care Screening Not Applicable Not Applicable Not Applicable  Skilled Nursing Facility Not Applicable Not Applicable Not Applicable

## 2022-09-17 DIAGNOSIS — D6869 Other thrombophilia: Secondary | ICD-10-CM | POA: Diagnosis not present

## 2022-09-17 DIAGNOSIS — R262 Difficulty in walking, not elsewhere classified: Secondary | ICD-10-CM | POA: Diagnosis not present

## 2022-09-17 DIAGNOSIS — M25551 Pain in right hip: Secondary | ICD-10-CM | POA: Diagnosis not present

## 2022-09-17 DIAGNOSIS — R079 Chest pain, unspecified: Secondary | ICD-10-CM | POA: Diagnosis not present

## 2022-09-17 DIAGNOSIS — R0989 Other specified symptoms and signs involving the circulatory and respiratory systems: Secondary | ICD-10-CM | POA: Diagnosis not present

## 2022-09-17 DIAGNOSIS — R42 Dizziness and giddiness: Secondary | ICD-10-CM | POA: Diagnosis not present

## 2022-09-17 DIAGNOSIS — D631 Anemia in chronic kidney disease: Secondary | ICD-10-CM | POA: Diagnosis not present

## 2022-09-17 DIAGNOSIS — E113393 Type 2 diabetes mellitus with moderate nonproliferative diabetic retinopathy without macular edema, bilateral: Secondary | ICD-10-CM | POA: Diagnosis not present

## 2022-09-17 DIAGNOSIS — Z7401 Bed confinement status: Secondary | ICD-10-CM | POA: Diagnosis not present

## 2022-09-17 DIAGNOSIS — R231 Pallor: Secondary | ICD-10-CM | POA: Diagnosis not present

## 2022-09-17 DIAGNOSIS — S72009A Fracture of unspecified part of neck of unspecified femur, initial encounter for closed fracture: Secondary | ICD-10-CM | POA: Diagnosis not present

## 2022-09-17 DIAGNOSIS — N250 Renal osteodystrophy: Secondary | ICD-10-CM | POA: Diagnosis not present

## 2022-09-17 DIAGNOSIS — R112 Nausea with vomiting, unspecified: Secondary | ICD-10-CM | POA: Diagnosis not present

## 2022-09-17 DIAGNOSIS — R531 Weakness: Secondary | ICD-10-CM | POA: Diagnosis not present

## 2022-09-17 DIAGNOSIS — S72141D Displaced intertrochanteric fracture of right femur, subsequent encounter for closed fracture with routine healing: Secondary | ICD-10-CM | POA: Diagnosis not present

## 2022-09-17 DIAGNOSIS — I480 Paroxysmal atrial fibrillation: Secondary | ICD-10-CM | POA: Diagnosis not present

## 2022-09-17 DIAGNOSIS — K2270 Barrett's esophagus without dysplasia: Secondary | ICD-10-CM | POA: Diagnosis not present

## 2022-09-17 DIAGNOSIS — M6281 Muscle weakness (generalized): Secondary | ICD-10-CM | POA: Diagnosis not present

## 2022-09-17 DIAGNOSIS — E1122 Type 2 diabetes mellitus with diabetic chronic kidney disease: Secondary | ICD-10-CM | POA: Diagnosis not present

## 2022-09-17 DIAGNOSIS — Z794 Long term (current) use of insulin: Secondary | ICD-10-CM | POA: Diagnosis not present

## 2022-09-17 DIAGNOSIS — B370 Candidal stomatitis: Secondary | ICD-10-CM | POA: Diagnosis not present

## 2022-09-17 DIAGNOSIS — R110 Nausea: Secondary | ICD-10-CM | POA: Diagnosis not present

## 2022-09-17 DIAGNOSIS — K5900 Constipation, unspecified: Secondary | ICD-10-CM | POA: Diagnosis not present

## 2022-09-17 DIAGNOSIS — R4182 Altered mental status, unspecified: Secondary | ICD-10-CM | POA: Diagnosis not present

## 2022-09-17 DIAGNOSIS — Z992 Dependence on renal dialysis: Secondary | ICD-10-CM | POA: Diagnosis not present

## 2022-09-17 DIAGNOSIS — F32A Depression, unspecified: Secondary | ICD-10-CM | POA: Diagnosis not present

## 2022-09-17 DIAGNOSIS — N186 End stage renal disease: Secondary | ICD-10-CM | POA: Diagnosis not present

## 2022-09-17 DIAGNOSIS — E1169 Type 2 diabetes mellitus with other specified complication: Secondary | ICD-10-CM | POA: Diagnosis not present

## 2022-09-17 DIAGNOSIS — E876 Hypokalemia: Secondary | ICD-10-CM | POA: Diagnosis not present

## 2022-09-17 DIAGNOSIS — Z8501 Personal history of malignant neoplasm of esophagus: Secondary | ICD-10-CM | POA: Diagnosis not present

## 2022-09-17 DIAGNOSIS — I9589 Other hypotension: Secondary | ICD-10-CM | POA: Diagnosis not present

## 2022-09-17 DIAGNOSIS — J811 Chronic pulmonary edema: Secondary | ICD-10-CM | POA: Diagnosis not present

## 2022-09-17 DIAGNOSIS — I2510 Atherosclerotic heart disease of native coronary artery without angina pectoris: Secondary | ICD-10-CM | POA: Diagnosis not present

## 2022-09-17 DIAGNOSIS — I499 Cardiac arrhythmia, unspecified: Secondary | ICD-10-CM | POA: Diagnosis not present

## 2022-09-17 DIAGNOSIS — I4891 Unspecified atrial fibrillation: Secondary | ICD-10-CM | POA: Diagnosis not present

## 2022-09-17 DIAGNOSIS — R031 Nonspecific low blood-pressure reading: Secondary | ICD-10-CM | POA: Diagnosis not present

## 2022-09-17 DIAGNOSIS — I5042 Chronic combined systolic (congestive) and diastolic (congestive) heart failure: Secondary | ICD-10-CM | POA: Diagnosis not present

## 2022-09-17 DIAGNOSIS — E440 Moderate protein-calorie malnutrition: Secondary | ICD-10-CM | POA: Diagnosis not present

## 2022-09-17 DIAGNOSIS — R000 Tachycardia, unspecified: Secondary | ICD-10-CM | POA: Diagnosis not present

## 2022-09-17 DIAGNOSIS — Z79899 Other long term (current) drug therapy: Secondary | ICD-10-CM | POA: Diagnosis not present

## 2022-09-17 DIAGNOSIS — I5022 Chronic systolic (congestive) heart failure: Secondary | ICD-10-CM | POA: Diagnosis not present

## 2022-09-17 DIAGNOSIS — R0602 Shortness of breath: Secondary | ICD-10-CM | POA: Diagnosis not present

## 2022-09-17 DIAGNOSIS — S72142A Displaced intertrochanteric fracture of left femur, initial encounter for closed fracture: Secondary | ICD-10-CM | POA: Diagnosis not present

## 2022-09-17 DIAGNOSIS — R5381 Other malaise: Secondary | ICD-10-CM | POA: Diagnosis not present

## 2022-09-17 DIAGNOSIS — D649 Anemia, unspecified: Secondary | ICD-10-CM | POA: Diagnosis not present

## 2022-09-17 DIAGNOSIS — I132 Hypertensive heart and chronic kidney disease with heart failure and with stage 5 chronic kidney disease, or end stage renal disease: Secondary | ICD-10-CM | POA: Diagnosis not present

## 2022-09-17 DIAGNOSIS — R0902 Hypoxemia: Secondary | ICD-10-CM | POA: Diagnosis not present

## 2022-09-17 DIAGNOSIS — I509 Heart failure, unspecified: Secondary | ICD-10-CM | POA: Diagnosis not present

## 2022-09-17 DIAGNOSIS — R051 Acute cough: Secondary | ICD-10-CM | POA: Diagnosis not present

## 2022-09-17 DIAGNOSIS — J90 Pleural effusion, not elsewhere classified: Secondary | ICD-10-CM | POA: Diagnosis not present

## 2022-09-17 DIAGNOSIS — I959 Hypotension, unspecified: Secondary | ICD-10-CM | POA: Diagnosis not present

## 2022-09-17 DIAGNOSIS — I10 Essential (primary) hypertension: Secondary | ICD-10-CM | POA: Diagnosis not present

## 2022-09-17 DIAGNOSIS — R059 Cough, unspecified: Secondary | ICD-10-CM | POA: Diagnosis not present

## 2022-09-17 LAB — BASIC METABOLIC PANEL
Anion gap: 11 (ref 5–15)
BUN: 48 mg/dL — ABNORMAL HIGH (ref 8–23)
CO2: 25 mmol/L (ref 22–32)
Calcium: 8.4 mg/dL — ABNORMAL LOW (ref 8.9–10.3)
Chloride: 98 mmol/L (ref 98–111)
Creatinine, Ser: 5 mg/dL — ABNORMAL HIGH (ref 0.61–1.24)
GFR, Estimated: 12 mL/min — ABNORMAL LOW (ref >60)
Glucose, Bld: 192 mg/dL — ABNORMAL HIGH (ref 70–99)
Potassium: 4.5 mmol/L (ref 3.5–5.1)
Sodium: 134 mmol/L — ABNORMAL LOW (ref 135–145)

## 2022-09-17 LAB — CBC WITH DIFFERENTIAL/PLATELET
Abs Immature Granulocytes: 0.06 10*3/uL (ref 0.00–0.07)
Basophils Absolute: 0.1 10*3/uL (ref 0.0–0.1)
Basophils Relative: 1 %
Eosinophils Absolute: 0.2 10*3/uL (ref 0.0–0.5)
Eosinophils Relative: 3 %
HCT: 27.3 % — ABNORMAL LOW (ref 39.0–52.0)
Hemoglobin: 8.6 g/dL — ABNORMAL LOW (ref 13.0–17.0)
Immature Granulocytes: 1 %
Lymphocytes Relative: 8 %
Lymphs Abs: 0.7 10*3/uL (ref 0.7–4.0)
MCH: 28.6 pg (ref 26.0–34.0)
MCHC: 31.5 g/dL (ref 30.0–36.0)
MCV: 90.7 fL (ref 80.0–100.0)
Monocytes Absolute: 0.7 10*3/uL (ref 0.1–1.0)
Monocytes Relative: 8 %
Neutro Abs: 6.6 10*3/uL (ref 1.7–7.7)
Neutrophils Relative %: 79 %
Platelets: 289 10*3/uL (ref 150–400)
RBC: 3.01 MIL/uL — ABNORMAL LOW (ref 4.22–5.81)
RDW: 17.8 % — ABNORMAL HIGH (ref 11.5–15.5)
WBC: 8.2 10*3/uL (ref 4.0–10.5)
nRBC: 0 % (ref 0.0–0.2)

## 2022-09-17 LAB — GLUCOSE, CAPILLARY
Glucose-Capillary: 116 mg/dL — ABNORMAL HIGH (ref 70–99)
Glucose-Capillary: 177 mg/dL — ABNORMAL HIGH (ref 70–99)

## 2022-09-17 MED ORDER — ENOXAPARIN SODIUM 30 MG/0.3ML IJ SOSY: 30.0000 mg | PREFILLED_SYRINGE | INTRAMUSCULAR | 0 refills | Status: DC

## 2022-09-17 MED ORDER — SENNOSIDES-DOCUSATE SODIUM 8.6-50 MG PO TABS: 1.0000 | ORAL_TABLET | Freq: Every day | ORAL | 0 refills | Status: AC

## 2022-09-17 MED ORDER — HEPARIN SODIUM (PORCINE) 1000 UNIT/ML DIALYSIS
4000.0000 [IU] | INTRAMUSCULAR | Status: DC | PRN
  Administered 2022-09-17: 4000 [IU] via INTRAVENOUS_CENTRAL
  Filled 2022-09-17 (×2): qty 4

## 2022-09-17 MED ORDER — LIDOCAINE-PRILOCAINE 2.5-2.5 % EX CREA: 1.0000 | TOPICAL_CREAM | CUTANEOUS | Status: DC | PRN

## 2022-09-17 MED ORDER — OXYCODONE HCL 5 MG PO TABS: 5.0000 mg | ORAL_TABLET | ORAL | 0 refills | Status: DC | PRN

## 2022-09-17 MED ORDER — METHOCARBAMOL 500 MG PO TABS: 500.0000 mg | ORAL_TABLET | Freq: Three times a day (TID) | ORAL | 0 refills | Status: DC | PRN

## 2022-09-17 MED ORDER — NOVOLOG FLEXPEN 100 UNIT/ML ~~LOC~~ SOPN: PEN_INJECTOR | SUBCUTANEOUS | 11 refills | Status: DC

## 2022-09-17 MED ORDER — POLYETHYLENE GLYCOL 3350 17 G PO PACK: 17.0000 g | PACK | Freq: Two times a day (BID) | ORAL | 0 refills | Status: DC

## 2022-09-17 MED ORDER — DOCUSATE SODIUM 100 MG PO CAPS: 100.0000 mg | ORAL_CAPSULE | Freq: Two times a day (BID) | ORAL | 0 refills | Status: AC

## 2022-09-17 MED ORDER — AMIODARONE HCL 200 MG PO TABS: 200.0000 mg | ORAL_TABLET | Freq: Two times a day (BID) | ORAL | 0 refills | Status: DC

## 2022-09-17 MED ORDER — PENTAFLUOROPROP-TETRAFLUOROETH EX AERO: 1.0000 | INHALATION_SPRAY | CUTANEOUS | Status: DC | PRN

## 2022-09-17 MED ORDER — LIDOCAINE HCL (PF) 1 % IJ SOLN: 5.0000 mL | INTRAMUSCULAR | Status: DC | PRN

## 2022-09-17 MED ORDER — BISACODYL 10 MG RE SUPP: 10.0000 mg | Freq: Every day | RECTAL | 0 refills | Status: DC | PRN

## 2022-09-17 NOTE — Progress Notes (Signed)
[facility] Progress Note   Subjective: Seen - on HD. UF goal 2L. Tolerating UF. No complaints this am.   Objective Vitals:   09/17/22 1030 09/17/22 1100 09/17/22 1103 09/17/22 1130  BP: 126/65  122/67 122/60  Pulse: 78 79 81 77  Resp: 19 14 16 14   Temp:      TempSrc:      SpO2: 100% 100% 98% 99%  Weight:      Height:        85 kg  Additional Objective Labs: Basic Metabolic Panel: Recent Labs  Lab 09/11/22 0455 09/11/22 0459 09/12/22 0357 09/13/22 0344 09/14/22 0429 09/15/22 0457 09/16/22 1039 09/17/22 0801  NA  --    < > 131* 136   < > 134* 135 134*  K  --    < > 5.0 3.9   < > 4.3 4.2 4.5  CL  --    < > 92* 95*   < > 94* 96* 98  CO2  --    < > 24 29   < > 27 24 25   GLUCOSE  --    < > 237* 207*   < > 169* 238* 192*  BUN  --    < > 52* 32*   < > 51* 40* 48*  CREATININE  --    < > 5.20* 3.62*   < > 5.38* 4.52* 5.00*  CALCIUM  --    < > 8.0* 8.2*   < > 8.3* 8.7* 8.4*  PHOS 4.1  --  4.3 3.1  --   --   --   --    < > = values in this interval not displayed.    CBC: Recent Labs  Lab 09/13/22 0344 09/14/22 0429 09/15/22 0457 09/16/22 1039 09/17/22 0325  WBC 7.3 6.6 7.0 8.6 8.2  NEUTROABS 5.3 4.7 5.0 6.4 6.6  HGB 8.3* 8.4* 8.2* 8.8* 8.6*  HCT 26.5* 27.4* 26.3* 27.6* 27.3*  MCV 92.0 93.2 92.3 91.4 90.7  PLT 217 225 263 299 289    Blood Culture    Component Value Date/Time   SDES  08/18/2022 1500    PLEURAL Performed at [facility], [address], [place], [place] [address]    SDES PLEURAL 08/18/2022 1500   SPECREQUEST  08/18/2022 1500    BAA 10CC Performed at [facility], [address] [place], [place] [address]    SPECREQUEST NONE 08/18/2022 1500   CULT  08/18/2022 1500    NO GROWTH 6 DAYS Performed at [facility], [address] [place], [place] [address]    REPTSTATUS 08/24/2022 FINAL 08/18/2022 1500   REPTSTATUS 08/18/2022 FINAL 08/18/2022 1500     Physical Exam General: Alert, nad, on nasal oxygen Heart: RRR   Lungs: Clear bilaterally Abdomen: soft non-tender Extremities: No LE edema  Dialysis Access: R IJ TDC   Medications:  promethazine (PHENERGAN) injection (IM or IVPB) 12.5 mg (09/13/22 1357)    (feeding supplement) PROSource Plus  30 mL Oral BID BM   amiodarone  200 mg Oral Daily   Chlorhexidine Gluconate Cloth  6 each Topical Q0600   docusate sodium  100 mg Oral BID   doxazosin  2 mg Oral Daily   enoxaparin (LOVENOX) injection  30 mg Subcutaneous Q24H   hydrALAZINE  50 mg Oral TID   insulin aspart  0-15 Units Subcutaneous TID WC   insulin aspart  0-5 Units Subcutaneous QHS   insulin glargine-yfgn  5 Units Subcutaneous Daily  pantoprazole  40 mg Oral BID   polyethylene glycol  4,000 mL Oral Once   senna-docusate  1 tablet Oral QHS   sertraline  100 mg Oral Daily    Dialysis Orders:  MWF at [facility] 4hr, 400/600, EDW 84.5kg, 2K/2.5Ca, LUE AVF, heparin 5000 unit bolus - Mircera IV q 2 weeks (last 3/29) - Getting course of IV iron - No VRDA, last given 3/6 - on hold for low PTH (33)  Assessment/Plan: R hip intertrochanteric Fx s/p fall: S/p IMN 3/30.  ESRD: usual MWF  HD. Off schedule this week d/t infiltration. Next HD Saturday, then back to MWF.   Hypertension/volume: BP stable, no edema on exam. Continue home meds. Anemia: Hgb  8.6  not due for ESA yet. IV Fe ordered here.  Metabolic bone disease: Ca/Phos acceptable. Ca acetate  binder  hold for now  Nutrition:  Alb 2.6, start protein supplements to assist with healing.  T2DM: Insulin per primary   CAD, Hx CABG Dispo: Plan for SNF  [name] Susann Givens PA-C [facility] 09/17/2022,11:57 AM

## 2022-09-17 NOTE — TOC Transition Note (Signed)
Transition of Care [facility]) - CM/SW Discharge Note   Patient Details  Name: [name] MRN: [id number] Date of Birth: [birth year]  Transition of Care [facility]) CM/SW Contact:  Deatra [place], [place] Phone Number: 09/17/2022, 2:03 PM   Clinical Narrative: Pt for dc to [facility]. Pt aware of dc and reports agreeable. Spoke to Kia in [facility] admissions who confirmed they are prepared to admit pt to room 119-A. RN provided with number for report and PTAR arranged for transport. SW signing off at dc.   Dellie Burns, MSW, LCSW [phone] (coverage)        Final next level of care: Skilled Nursing Facility Barriers to Discharge: No Barriers Identified   Patient Goals and CMS Choice CMS Medicare.gov Compare Post Acute Care list provided to:: Patient Choice offered to / list presented to : Patient  Discharge Placement                Patient chooses bed at: [facility] Patient to be transferred to facility by: PTAR Name of family member notified: Pt to update family Patient and family notified of of transfer: 09/17/22  Discharge Plan and Services Additional resources added to the After Visit Summary for                                       Social Determinants of Health ([name]) Interventions [name] Screenings   Food Insecurity: No Food Insecurity (09/09/2022)  Housing: Low Risk  (09/09/2022)  Transportation Needs: Unmet Transportation Needs (09/09/2022)  Utilities: At Risk (09/09/2022)  Alcohol Screen: Low Risk  (04/21/2022)  Depression (PHQ2-9): Medium Risk (09/01/2022)  Financial Resource Strain: Low Risk  (04/21/2022)  Physical Activity: Inactive (04/21/2022)  Social Connections: Unknown (04/21/2022)  Stress: No Stress Concern Present (04/21/2022)  Tobacco Use: Medium Risk (09/15/2022)     Readmission Risk Interventions    08/19/2022    8:11 AM 05/04/2022    9:06 AM 03/01/2022    8:14 AM  Readmission Risk Prevention Plan   Transportation Screening Complete Complete Complete  Medication Review Oceanographer) Complete Complete Complete  HRI or Home Care Consult Complete Complete Complete  SW Recovery Care/Counseling Consult Complete Complete Complete  Palliative Care Screening Not Applicable Not Applicable Not Applicable  Skilled Nursing Facility Not Applicable Not Applicable Not Applicable

## 2022-09-17 NOTE — Discharge Summary (Signed)
Physician Discharge Summary   Patient: [name] MRN: [id number] DOB: 10-23-1955  Admit date:     09/09/2022  Discharge date: 09/17/22  Discharge Physician: Rickey BarbaraStephen Latorie Montesano   PCP: Babs SciaraLuking, Scott A, MD   Recommendations at discharge:   Follow up wit PCP in 1-2 weeks Continue with routine TTS HD Follow up with Orthopedic Surgery as scheduled in 2 weeks Monitor HR and BP. Consider resumption of metoprolol as BP and HR allows down the road  Discharge Diagnoses: Principal Problem:   Hip fracture Active Problems:   ESRD (end stage renal disease) on dialysis   Chronic combined systolic and diastolic CHF (congestive heart failure)   Paroxysmal supraventricular tachycardia   Hypokalemia   DM type 2 causing vascular disease   Depression  Resolved Problems:   Hyperglycemia due to diabetes mellitus  Hospital Course: 67 y.o. male with medical history significant of coronary artery disease, CHF, depression, hypertension, history of stroke, diabetes mellitus type 2, and more who initially presented to [facility] ED after a mechanical fall.  Workup revealed right hip fracture, communicated intertrochanteric fracture of right hip with mildly displaced fragments.  [facility] surgery evaluation.  Seen by orthopedic surgery and taken to the OR on 09/10/2022.   Assessment and Plan: Right hip fracture (HCC) post mechanical fall, POA s/p IM nail intertrochanteric X-ray showed a comminuted intertrochanteric fracture of right hip with mildly displaced fragments -Pt underwent surgery on 3/30 Continue with analgesia as needed Per Orthopedic Surgery, keep dressings intact until follow up, change PRN if soiled or saturated. Also prophylactic lovenox until 4/27 (4 weeks from POD1) PT/OT, plan for SNF   N/V Afebrile, no leukocytosis Unclear etiology, may be due to pain meds Reviewed abd xray. Findings of increased gas Pt has bowel sounds on exam, reportedly  tolerating breakfast this AM Recommend scheduled cathartics. Pt has BS, no complaints   ESRD (end stage renal disease) on dialysis (HCC) on HD MWF Continue HD as tolerated   Chronic combined systolic and diastolic CHF (congestive heart failure) (HCC) Echo on 08/2022 showed EF of 50%, no regional wall motion abnormality, grade 2 diastolic dysfunction Euvolemic on exam. Resume Repatha at discharge BP had remained very well controlled off metoprolol. Would have pt f/u with PCP and consider resuming on outpt basis as BP and HR allows   Paroxysmal supraventricular tachycardia Continue amiodarone Remains rate controlled Had remained very stable off metoprolol this visit. Recommend close f/u and consider resuming metoprolol as BP and HR allows   DM type 2 causing vascular disease (HCC) Insulin sliding scale   Depression Continue Zoloft   Anemia  -Likely secondary to recent surgery -Hgb stable -Given dose of IV iron    Consultants: Orthopedic Surgery Procedures performed: INTRAMEDULLARY (IM) NAIL INTERTROCHANTERIC  for R intertrochanteric femur fracture 3/30  Disposition: Skilled nursing facility Diet recommendation:  Carb modified diet DISCHARGE MEDICATION: Allergies as of 09/17/2022       Reactions   Bee Venom Anaphylaxis   Atorvastatin Other (See Comments)   myalgia   Diltiazem Itching   Lopressor [metoprolol]    NIGHTMARES    Reglan [metoclopramide] Other (See Comments)   Suicidal    Rosuvastatin Other (See Comments)   myalgias   Valsartan Itching        Medication List     STOP taking these medications    aspirin EC 325 MG tablet   metoprolol succinate 50 MG 24 hr tablet Commonly known as: TOPROL-XL  TAKE these medications    acetaminophen 325 MG tablet Commonly known as: TYLENOL Take 2 tablets (650 mg total) by mouth every 6 (six) hours as needed for mild pain, moderate pain or fever.   albuterol 108 (90 Base) MCG/ACT inhaler Commonly known as:  VENTOLIN HFA INHALE TWO PUFFS BY MOUTH EVERY 6 HOURS AS NEEDED What changed: See the new instructions.   amiodarone 200 MG tablet Commonly known as: PACERONE Take 1 tablet (200 mg total) by mouth 2 (two) times daily. Through 09/08/22.  Then 1 tab (200 mg) once daily starting 09/09/22 What changed: when to take this   bisacodyl 10 MG suppository Commonly known as: DULCOLAX Place 1 suppository (10 mg total) rectally daily as needed for moderate constipation.   calcium acetate 667 MG capsule Commonly known as: PHOSLO Take 667 mg by mouth in the morning, at noon, and at bedtime.   docusate sodium 100 MG capsule Commonly known as: COLACE Take 1 capsule (100 mg total) by mouth 2 (two) times daily.   doxazosin 2 MG tablet Commonly known as: CARDURA TAKE 1 TABLET BY MOUTH DAILY   enoxaparin 30 MG/0.3ML injection Commonly known as: LOVENOX Inject 0.3 mLs (30 mg total) into the skin daily for 21 days.   EPINEPHrine 0.3 mg/0.3 mL Soaj injection Commonly known as: EPI-PEN Inject 0.3 mg into the muscle as needed for anaphylaxis.   hydrALAZINE 50 MG tablet Commonly known as: APRESOLINE Take 1 tablet (50 mg total) by mouth 3 (three) times daily.   Lantus 100 UNIT/ML injection Generic drug: insulin glargine 8 mLs daily. Sliding scale   methocarbamol 500 MG tablet Commonly known as: ROBAXIN Take 1 tablet (500 mg total) by mouth every 8 (eight) hours as needed for muscle spasms.   nitroGLYCERIN 0.4 MG SL tablet Commonly known as: NITROSTAT Place 1 tablet (0.4 mg total) under the tongue every 5 (five) minutes as needed for chest pain.   NovoLOG FlexPen 100 UNIT/ML FlexPen Generic drug: insulin aspart Sliding scale AC/HS: CBG 70 - 120: 0 units  CBG 121 - 150: 2 units  CBG 151 - 200: 3 units  CBG 201 - 250: 5 units  CBG 251 - 300: 8 units  CBG 301 - 350: 11 units  CBG 351 - 400: 15 units  CBG > 400: call MD What changed:  how much to take how to take this when to take  this additional instructions   ondansetron 4 MG disintegrating tablet Commonly known as: ZOFRAN-ODT Take 1 tablet by mouth every 8 hours as needed for nausea and vomiting.   oxyCODONE 5 MG immediate release tablet Commonly known as: Oxy IR/ROXICODONE Take 1-2 tablets (5-10 mg total) by mouth every 4 (four) hours as needed for moderate pain or severe pain.   pantoprazole 40 MG tablet Commonly known as: PROTONIX Take 1 tablet (40 mg total) by mouth 2 (two) times daily.   polyethylene glycol 17 g packet Commonly known as: MiraLax Take 17 g by mouth 2 (two) times daily.   Repatha SureClick 140 MG/ML Soaj Generic drug: Evolocumab Inject 140 mg into the skin every 14 (fourteen) days.   senna-docusate 8.6-50 MG tablet Commonly known as: Senokot-S Take 1 tablet by mouth at bedtime.   sertraline 100 MG tablet Commonly known as: ZOLOFT TAKE 1 TABLET BY MOUTH DAILY        Follow-up Information     Joen Laura, MD Follow up in 2 week(s).   Specialty: Orthopedic Surgery Contact information: [address]  [address] [place] [address] [phone]                Discharge Exam: Ceasar Mons Weights   09/14/22 1002 09/15/22 1040 09/15/22 1432  Weight: 86.1 kg 93.3 kg 85 kg   General exam: Awake, laying in bed, in nad Respiratory system: Normal respiratory effort, no wheezing Cardiovascular system: regular rate, s1, s2 Gastrointestinal system: Soft, nondistended, positive BS Central nervous system: CN2-12 grossly intact, strength intact Extremities: Perfused, no clubbing Skin: Normal skin turgor, no notable skin lesions seen Psychiatry: Mood normal // no visual hallucinations   Condition at discharge: fair  The results of significant diagnostics from this hospitalization (including imaging, microbiology, ancillary and laboratory) are listed below for reference.   Imaging Studies: DG Abd Portable 1V  Result Date: 09/13/2022 CLINICAL DATA:  Nausea vomiting  EXAM: PORTABLE ABDOMEN - 1 VIEW COMPARISON:  CT 08/18/2022 FINDINGS: Mild air distension of central small bowel up to 3 cm with scattered colon gas. Moderate stool in the colon. Right pleural effusion. IMPRESSION: Mild air distension of central small bowel loops, nonspecific but could be secondary to ileus, enteritis versus partial or developing bowel obstruction. Consider radiographic follow-up. Electronically Signed   By: Jasmine Pang M.D.   On: 09/13/2022 18:55   DG FEMUR PORT, MIN 2 VIEWS RIGHT  Result Date: 09/10/2022 CLINICAL DATA:  Femur fracture, postop. EXAM: RIGHT FEMUR PORTABLE 2 VIEW COMPARISON:  Preoperative imaging. FINDINGS: Lung intramedullary nail with trans trochanteric and distal locking screw fixation traverse comminuted proximal femur fracture. Improved fracture alignment from preoperative imaging. Recent postsurgical change includes air and edema in the soft tissues. IMPRESSION: ORIF of comminuted proximal femur fracture. No immediate postoperative complication. Electronically Signed   By: Narda Rutherford M.D.   On: 09/10/2022 15:11   DG FEMUR, MIN 2 VIEWS RIGHT  Result Date: 09/10/2022 CLINICAL DATA:  Proximal right femoral fixation EXAM: RIGHT FEMUR 2 VIEWS COMPARISON:  None Available. FINDINGS: Nine fluoroscopic images obtained during proximal right femoral fixation. 1 minute 53 seconds fluoro time utilized. Radiation dose 16.69 mGy Kerma. Please see performing physicians operative report for full details IMPRESSION: Fluoroscopic images were obtained for intraoperative guidance of proximal right femoral fixation. Electronically Signed   By: Agustin Cree M.D.   On: 09/10/2022 14:26   DG C-Arm 1-60 Min-No Report  Result Date: 09/10/2022 Fluoroscopy was utilized by the requesting physician.  No radiographic interpretation.   CT Head Wo Contrast  Result Date: 09/09/2022 CLINICAL DATA:  Trauma, fall EXAM: CT HEAD WITHOUT CONTRAST TECHNIQUE: Contiguous axial images were obtained  from the base of the skull through the vertex without intravenous contrast. RADIATION DOSE REDUCTION: This exam was performed according to the departmental dose-optimization program which includes automated exposure control, adjustment of the mA and/or kV according to patient size and/or use of iterative reconstruction technique. COMPARISON:  03/03/2022 FINDINGS: Brain: No acute intracranial findings are seen. Ventricles are nondilated. Cortical sulci are prominent. There is decreased density in periventricular and subcortical white matter. Vascular: Scattered arterial calcifications are seen. Skull: No fracture is seen in calvarium. Sinuses/Orbits: Unremarkable. Other: None. IMPRESSION: No acute intracranial findings are seen in noncontrast CT brain. Atrophy. Small-vessel disease. Electronically Signed   By: Ernie Avena M.D.   On: 09/09/2022 19:45   DG Knee 1-2 Views Right  Result Date: 09/09/2022 CLINICAL DATA:  Fall at home with pain to the right leg EXAM: RIGHT KNEE - 2 VIEW COMPARISON:  None Available. FINDINGS: No evidence of fracture or dislocation. Likely  joint effusion, although assessment is technically challenging in the absence of true lateral view. No evidence of arthropathy or other focal bone abnormality. Soft tissues are unremarkable. IMPRESSION: No evidence of fracture or dislocation. Likely joint effusion, although assessment is technically challenging in the absence of true lateral view. Electronically Signed   By: Agustin Cree M.D.   On: 09/09/2022 18:45   DG Hip Unilat W or Wo Pelvis 2-3 Views Right  Result Date: 09/09/2022 CLINICAL DATA:  Pain after fall EXAM: DG HIP (WITH OR WITHOUT PELVIS) 3V RIGHT COMPARISON:  None Available. FINDINGS: Comminuted foreshortened intertrochanteric right hip fracture identified. Mild displaced fragments. Moderate joint space loss of the hips bilaterally. Osteopenia. Hyperostosis. Scattered vascular calcifications. Sclerosis along the right  supra-acetabular region. Possible bone islands. Please correlate for any known history. This is stable going back to abdomen and pelvis CT of 03/03/2022. IMPRESSION: Comminuted intertrochanteric fracture of the right hip with mildly displaced fragments. Some foreshortening. Electronically Signed   By: Karen Kays M.D.   On: 09/09/2022 18:05   US THORACENTESIS ASP PLEURAL SPACE W/IMG GUIDE  Result Date: 08/18/2022 INDICATION: Patient with a history of heart failure and gastric/esophageal cancer with right pleural effusion. Interventional Radiology asked to perform a diagnostic and therapeutic thoracentesis. EXAM: ULTRASOUND GUIDED THORACENTESIS MEDICATIONS: None. COMPLICATIONS: None immediate. PROCEDURE: An ultrasound guided thoracentesis was thoroughly discussed with the patient and questions answered. The benefits, risks, alternatives and complications were also discussed. The patient understands and wishes to proceed with the procedure. Written consent was obtained. Ultrasound was performed to localize and mark an adequate pocket of fluid in the right chest. The area was then prepped and draped in the normal sterile fashion. 1% Lidocaine was used for local anesthesia. Under ultrasound guidance a 6 Fr Safe-T-Centesis catheter was introduced. Thoracentesis was performed. The catheter was removed and a dressing applied. FINDINGS: A total of approximately 1.2 L of clear yellow fluid was removed. Samples were sent to the laboratory as requested by the clinical team. IMPRESSION: Successful ultrasound guided right thoracentesis yielding 1.2 L of pleural fluid. Read by: Alwyn Ren, NP Electronically Signed   By: Obie Dredge M.D.   On: 08/18/2022 16:35   DG Chest Port 1 View  Result Date: 08/18/2022 CLINICAL DATA:  Status post right thoracentesis EXAM: PORTABLE CHEST 1 VIEW COMPARISON:  CT chest 08/18/2022 and chest radiograph 08/18/2022 FINDINGS: Previous right pleural effusion no longer readily apparent.  No visible pneumothorax. Moderate enlargement of the cardiopericardial silhouette. Prior CABG. Indistinct pulmonary vasculature may reflect pulmonary venous hypertension but there is no overt edema. IMPRESSION: 1. Previous right pleural effusion no longer readily apparent. No pneumothorax. 2. Moderate enlargement of the cardiopericardial silhouette with potential pulmonary venous hypertension but no overt edema. 3. Prior CABG. Electronically Signed   By: Gaylyn Rong M.D.   On: 08/18/2022 15:34   ECHOCARDIOGRAM COMPLETE  Result Date: 08/18/2022    ECHOCARDIOGRAM REPORT   Patient Name:   ALBON BOGDANSKI Date of Exam: 08/18/2022 Medical Rec #:  [id number]        Height:       70.0 in Accession #:    [id number]       Weight:       199.0 lb Date of Birth:  Dec 11, 1955        BSA:          2.083 m Patient Age:    66 years         BP:  154/73 mmHg Patient Gender: M                HR:           82 bpm. Exam Location:  [facility] Procedure: 2D Echo, Cardiac Doppler and Color Doppler STAT ECHO Indications:    Chest Pain R07.9  History:        Patient has prior history of Echocardiogram examinations, most                 recent 02/19/2022. CHF and Cardiomyopathy, CAD, Prior CABG,                 Stroke, Arrythmias:Tachycardia, Signs/Symptoms:Shortness of                 Breath and Chest Pain; Risk Factors:Hypertension, Diabetes and                 Dyslipidemia.  Sonographer:    Celesta Gentile RCS Referring Phys: [id number] Dorothe Pea BRANCH IMPRESSIONS  1. Left ventricular ejection fraction, by estimation, is 50%. The left ventricle has low normal function. The left ventricle has no regional wall motion abnormalities. There is moderate left ventricular hypertrophy. Left ventricular diastolic parameters  are consistent with Grade II diastolic dysfunction (pseudonormalization). Elevated left atrial pressure.  2. Right ventricular systolic function is normal. The right ventricular size is mildly enlarged. There is  severely elevated pulmonary artery systolic pressure.  3. Left atrial size was severely dilated.  4. Right atrial size was severely dilated.  5. The mitral valve is abnormal. Trivial mitral valve regurgitation.  6. The tricuspid valve is abnormal.  7. The aortic valve has an indeterminant number of cusps. Aortic valve regurgitation is not visualized. No aortic stenosis is present.  8. The inferior vena cava is dilated in size with <50% respiratory variability, suggesting right atrial pressure of 15 mmHg. FINDINGS  Left Ventricle: Left ventricular ejection fraction, by estimation, is 50%. The left ventricle has low normal function. The left ventricle has no regional wall motion abnormalities. Definity contrast agent was given IV to delineate the left ventricular endocardial borders. The left ventricular internal cavity size was normal in size. There is moderate left ventricular hypertrophy. Left ventricular diastolic parameters are consistent with Grade II diastolic dysfunction (pseudonormalization). Elevated left atrial pressure. Right Ventricle: The right ventricular size is mildly enlarged. Right vetricular wall thickness was not well visualized. Right ventricular systolic function is normal. There is severely elevated pulmonary artery systolic pressure. The tricuspid regurgitant velocity is 3.61 m/s, and with an assumed right atrial pressure of 15 mmHg, the estimated right ventricular systolic pressure is 67.1 mmHg. Left Atrium: Left atrial size was severely dilated. Right Atrium: Right atrial size was severely dilated. Pericardium: There is no evidence of pericardial effusion. Mitral Valve: The mitral valve is abnormal. There is mild thickening of the mitral valve leaflet(s). There is mild calcification of the mitral valve leaflet(s). Mild mitral annular calcification. Trivial mitral valve regurgitation. MV peak gradient, 6.9 mmHg. The mean mitral valve gradient is 2.0 mmHg. Tricuspid Valve: The tricuspid valve  is abnormal. Tricuspid valve regurgitation is mild . No evidence of tricuspid stenosis. Aortic Valve: The aortic valve has an indeterminant number of cusps. Aortic valve regurgitation is not visualized. No aortic stenosis is present. Aortic valve mean gradient measures 4.7 mmHg. Aortic valve peak gradient measures 10.8 mmHg. Aortic valve area, by VTI measures 2.29 cm. Pulmonic Valve: The pulmonic valve was not well visualized. Pulmonic valve regurgitation is not visualized.  No evidence of pulmonic stenosis. Aorta: The aortic root is normal in size and structure. Venous: The inferior vena cava is dilated in size with less than 50% respiratory variability, suggesting right atrial pressure of 15 mmHg. IAS/Shunts: No atrial level shunt detected by color flow Doppler.  LEFT VENTRICLE PLAX 2D LVIDd:         5.80 cm   Diastology LVIDs:         4.30 cm   LV e' medial:    5.11 cm/s LV PW:         1.30 cm   LV E/e' medial:  26.8 LV IVS:        1.30 cm   LV e' lateral:   12.10 cm/s LVOT diam:     2.00 cm   LV E/e' lateral: 11.3 LV SV:         73 LV SV Index:   35 LVOT Area:     3.14 cm  RIGHT VENTRICLE RV S prime:     8.81 cm/s TAPSE (M-mode): 1.8 cm LEFT ATRIUM              Index        RIGHT ATRIUM           Index LA diam:        5.40 cm  2.59 cm/m   RA Area:     31.70 cm LA Vol (A2C):   141.0 ml 67.69 ml/m  RA Volume:   125.00 ml 60.01 ml/m LA Vol (A4C):   145.0 ml 69.62 ml/m LA Biplane Vol: 148.0 ml 71.06 ml/m  AORTIC VALVE AV Area (Vmax):    1.97 cm AV Area (Vmean):   2.22 cm AV Area (VTI):     2.29 cm AV Vmax:           164.56 cm/s AV Vmean:          97.590 cm/s AV VTI:            0.316 m AV Peak Grad:      10.8 mmHg AV Mean Grad:      4.7 mmHg LVOT Vmax:         103.00 cm/s LVOT Vmean:        69.000 cm/s LVOT VTI:          0.231 m LVOT/AV VTI ratio: 0.73  AORTA Ao Root diam: 3.50 cm MITRAL VALVE                TRICUSPID VALVE MV Area (PHT): 4.68 cm     TR Peak grad:   52.1 mmHg MV Area VTI:   2.15 cm     TR  Vmax:        361.00 cm/s MV Peak grad:  6.9 mmHg MV Mean grad:  2.0 mmHg     SHUNTS MV Vmax:       1.31 m/s     Systemic VTI:  0.23 m MV Vmean:      64.8 cm/s    Systemic Diam: 2.00 cm MV Decel Time: 162 msec MV E velocity: 137.00 cm/s MV A velocity: 84.80 cm/s MV E/A ratio:  1.62 Dina Rich MD Electronically signed by Dina Rich MD Signature Date/Time: 08/18/2022/12:22:42 PM    Final     Microbiology: Results for orders placed or performed during the hospital encounter of 09/09/22  Surgical pcr screen     Status: None   Collection Time: 09/10/22  1:00 AM   Specimen: Nasal Mucosa; Nasal Swab  Result Value Ref Range Status   MRSA, PCR NEGATIVE NEGATIVE Final   Staphylococcus aureus NEGATIVE NEGATIVE Final    Comment: (NOTE) The Xpert SA Assay (FDA approved for NASAL specimens in patients 73 years of age and older), is one component of a comprehensive surveillance program. It is not intended to diagnose infection nor to guide or monitor treatment. Performed at [facility], [address] [place], [place] [address]     Labs: CBC: Recent Labs  Lab 09/13/22 [phone] 09/14/22 0429 09/15/22 0457 09/16/22 1039 09/17/22 0325  WBC 7.3 6.6 7.0 8.6 8.2  NEUTROABS 5.3 4.7 5.0 6.4 6.6  HGB 8.3* 8.4* 8.2* 8.8* 8.6*  HCT 26.5* 27.4* 26.3* 27.6* 27.3*  MCV 92.0 93.2 92.3 91.4 90.7  PLT 217 225 263 299 289   Basic Metabolic Panel: Recent Labs  Lab 09/11/22 0455 09/11/22 0459 09/11/22 0459 09/12/22 0357 09/13/22 0344 09/14/22 0429 09/15/22 0457 09/16/22 1039 09/17/22 0801  NA  --  135   < > 131* 136 135 134* 135 134*  K  --  4.7   < > 5.0 3.9 4.0 4.3 4.2 4.5  CL  --  96*   < > 92* 95* 97* 94* 96* 98  CO2  --  24   < > 24 29 26 27 24 25   GLUCOSE  --  261*   < > 237* 207* 127* 169* 238* 192*  BUN  --  37*   < > 52* 32* 42* 51* 40* 48*  CREATININE  --  4.42*   < > 5.20* 3.62* 4.82* 5.38* 4.52* 5.00*  CALCIUM  --  8.0*   < > 8.0* 8.2* 8.1* 8.3* 8.7* 8.4*  MG  --  2.9*  --    --   --   --   --   --   --   PHOS 4.1  --   --  4.3 3.1  --   --   --   --    < > = values in this interval not displayed.   Liver Function Tests: Recent Labs  Lab 09/12/22 0357 09/13/22 0344 09/14/22 0429 09/15/22 0457 09/16/22 1039  AST  --   --  8* 10* 10*  ALT  --   --  <5 <5 <5  ALKPHOS  --   --  80 87 106  BILITOT  --   --  0.7 0.8 0.7  PROT  --   --  5.7* 5.8* 6.4*  ALBUMIN 2.9* 2.8* 2.6* 2.6* 2.9*   CBG: Recent Labs  Lab 09/16/22 0756 09/16/22 1142 09/16/22 1703 09/16/22 2046 09/17/22 1256  GLUCAP 208* 266* 116* 173* 116*    Discharge time spent: less than 30 minutes.  Signed: Rickey Barbara, MD [facility] 09/17/2022

## 2022-09-17 NOTE — Progress Notes (Addendum)
   09/17/22 1346  Vitals  Temp 98.4 F (36.9 C)  Temp Source Oral  BP (!) 132/59  MAP (mmHg) 81  Pulse Rate 78  ECG Heart Rate 80  Resp 18  Oxygen Therapy  SpO2 100 %  O2 Device Nasal Cannula  O2 Flow Rate (L/min) 2 L/min  Patient Activity (if Appropriate) In bed  Pulse Oximetry Type Continuous  During Treatment Monitoring  Intra-Hemodialysis Comments Tx completed  Post Treatment  Dialyzer Clearance Clear  Duration of HD Treatment -hour(s) 0 hour(s)  Liters Processed 90  Fluid Removed (mL) 2000 mL  Tolerated HD Treatment Yes  Post-Hemodialysis Comments 5  AVG/AVF Arterial Site Held (minutes) 5 minutes  Note  Observations Pt. tolerated Procedure well.  Fistula / Graft Left Forearm Arteriovenous fistula  Placement Date/Time: 12/21/21 1112   Placed prior to admission: No  Orientation: Left  Access Location: Forearm  Access Type: (c) Arteriovenous fistula  Site Condition No complications  Fistula / Graft Assessment Present;Thrill;Bruit  Status Deaccessed  Needle Size 15g  Drainage Description None   Received patient in bed to unit.  Alert and oriented.  Informed consent signed and in chart.   TX duration:3:45  Patient tolerated well.  Transported back to the room  Alert, without acute distress.  Hand-off given to patient's nurse.   Access used: Yes Access issues: None  Total UF removed: 2000 Medication(s) given: Pre Heparin 4000 units Post HD VS: See Above Grid Post HD weight: 84.1 kg   [facility]

## 2022-09-17 NOTE — Plan of Care (Signed)

## 2022-09-19 DIAGNOSIS — M25551 Pain in right hip: Secondary | ICD-10-CM | POA: Diagnosis not present

## 2022-09-19 DIAGNOSIS — I10 Essential (primary) hypertension: Secondary | ICD-10-CM | POA: Diagnosis not present

## 2022-09-19 DIAGNOSIS — D649 Anemia, unspecified: Secondary | ICD-10-CM | POA: Diagnosis not present

## 2022-09-19 DIAGNOSIS — E876 Hypokalemia: Secondary | ICD-10-CM | POA: Diagnosis not present

## 2022-09-19 DIAGNOSIS — F32A Depression, unspecified: Secondary | ICD-10-CM | POA: Diagnosis not present

## 2022-09-19 DIAGNOSIS — N186 End stage renal disease: Secondary | ICD-10-CM | POA: Diagnosis not present

## 2022-09-19 DIAGNOSIS — S72141D Displaced intertrochanteric fracture of right femur, subsequent encounter for closed fracture with routine healing: Secondary | ICD-10-CM | POA: Diagnosis not present

## 2022-09-19 NOTE — Progress Notes (Signed)
Late Note Entry  Pt was d/c to snf on Saturday. Contacted [facility] and spoke to [place]. Inquired if clinic received orders in order for pt to start today. Clinic to return call to navigator if orders were not received. Clinic aware pt to start today.   Olivia Canter Renal Navigator [phone]

## 2022-09-20 DIAGNOSIS — Z992 Dependence on renal dialysis: Secondary | ICD-10-CM | POA: Diagnosis not present

## 2022-09-20 DIAGNOSIS — N186 End stage renal disease: Secondary | ICD-10-CM | POA: Diagnosis not present

## 2022-09-21 ENCOUNTER — Telehealth: Payer: Self-pay

## 2022-09-21 DIAGNOSIS — Z992 Dependence on renal dialysis: Secondary | ICD-10-CM | POA: Diagnosis not present

## 2022-09-21 DIAGNOSIS — N186 End stage renal disease: Secondary | ICD-10-CM | POA: Diagnosis not present

## 2022-09-21 NOTE — Telephone Encounter (Signed)
Patient dropped off document  Certification of Disability for property tax exclusion , to be filled out by provider. Patient requested to send it via Call Patient to pick up within 7-days. Document is located in providers tray at front office.Please advise at Mobile [phone] (mobile)

## 2022-09-22 ENCOUNTER — Emergency Department (HOSPITAL_COMMUNITY): Payer: Medicare HMO

## 2022-09-22 ENCOUNTER — Emergency Department (HOSPITAL_COMMUNITY)
Admission: EM | Admit: 2022-09-22 | Discharge: 2022-09-22 | Disposition: A | Discharge status: Skilled Nursing Facility | Payer: Medicare HMO | Attending: Emergency Medicine | Admitting: Emergency Medicine

## 2022-09-22 DIAGNOSIS — I2510 Atherosclerotic heart disease of native coronary artery without angina pectoris: Secondary | ICD-10-CM | POA: Diagnosis not present

## 2022-09-22 DIAGNOSIS — Z794 Long term (current) use of insulin: Secondary | ICD-10-CM | POA: Insufficient documentation

## 2022-09-22 DIAGNOSIS — R079 Chest pain, unspecified: Secondary | ICD-10-CM | POA: Diagnosis not present

## 2022-09-22 DIAGNOSIS — D6869 Other thrombophilia: Secondary | ICD-10-CM | POA: Diagnosis not present

## 2022-09-22 DIAGNOSIS — E1122 Type 2 diabetes mellitus with diabetic chronic kidney disease: Secondary | ICD-10-CM | POA: Diagnosis not present

## 2022-09-22 DIAGNOSIS — I5022 Chronic systolic (congestive) heart failure: Secondary | ICD-10-CM | POA: Diagnosis not present

## 2022-09-22 DIAGNOSIS — I132 Hypertensive heart and chronic kidney disease with heart failure and with stage 5 chronic kidney disease, or end stage renal disease: Secondary | ICD-10-CM | POA: Insufficient documentation

## 2022-09-22 DIAGNOSIS — I509 Heart failure, unspecified: Secondary | ICD-10-CM | POA: Insufficient documentation

## 2022-09-22 DIAGNOSIS — I959 Hypotension, unspecified: Secondary | ICD-10-CM | POA: Diagnosis not present

## 2022-09-22 DIAGNOSIS — R4182 Altered mental status, unspecified: Secondary | ICD-10-CM | POA: Diagnosis not present

## 2022-09-22 DIAGNOSIS — I480 Paroxysmal atrial fibrillation: Secondary | ICD-10-CM

## 2022-09-22 DIAGNOSIS — N186 End stage renal disease: Secondary | ICD-10-CM | POA: Diagnosis not present

## 2022-09-22 DIAGNOSIS — I4891 Unspecified atrial fibrillation: Secondary | ICD-10-CM | POA: Diagnosis not present

## 2022-09-22 DIAGNOSIS — Z8501 Personal history of malignant neoplasm of esophagus: Secondary | ICD-10-CM | POA: Diagnosis not present

## 2022-09-22 DIAGNOSIS — J811 Chronic pulmonary edema: Secondary | ICD-10-CM | POA: Diagnosis not present

## 2022-09-22 DIAGNOSIS — I9589 Other hypotension: Secondary | ICD-10-CM | POA: Diagnosis not present

## 2022-09-22 DIAGNOSIS — Z79899 Other long term (current) drug therapy: Secondary | ICD-10-CM | POA: Insufficient documentation

## 2022-09-22 DIAGNOSIS — R031 Nonspecific low blood-pressure reading: Secondary | ICD-10-CM | POA: Diagnosis not present

## 2022-09-22 DIAGNOSIS — R0602 Shortness of breath: Secondary | ICD-10-CM | POA: Diagnosis not present

## 2022-09-22 LAB — CBC WITH DIFFERENTIAL/PLATELET
Abs Immature Granulocytes: 0.03 10*3/uL (ref 0.00–0.07)
Basophils Absolute: 0.1 10*3/uL (ref 0.0–0.1)
Basophils Relative: 1 %
Eosinophils Absolute: 0.1 10*3/uL (ref 0.0–0.5)
Eosinophils Relative: 2 %
HCT: 32.4 % — ABNORMAL LOW (ref 39.0–52.0)
Hemoglobin: 9.8 g/dL — ABNORMAL LOW (ref 13.0–17.0)
Immature Granulocytes: 0 %
Lymphocytes Relative: 13 %
Lymphs Abs: 1 10*3/uL (ref 0.7–4.0)
MCH: 28.3 pg (ref 26.0–34.0)
MCHC: 30.2 g/dL (ref 30.0–36.0)
MCV: 93.6 fL (ref 80.0–100.0)
Monocytes Absolute: 0.7 10*3/uL (ref 0.1–1.0)
Monocytes Relative: 9 %
Neutro Abs: 5.8 10*3/uL (ref 1.7–7.7)
Neutrophils Relative %: 75 %
Platelets: 365 10*3/uL (ref 150–400)
RBC: 3.46 MIL/uL — ABNORMAL LOW (ref 4.22–5.81)
RDW: 17 % — ABNORMAL HIGH (ref 11.5–15.5)
WBC: 7.8 10*3/uL (ref 4.0–10.5)
nRBC: 0 % (ref 0.0–0.2)

## 2022-09-22 LAB — COMPREHENSIVE METABOLIC PANEL
ALT: 7 U/L (ref 0–44)
AST: 16 U/L (ref 15–41)
Albumin: 2.9 g/dL — ABNORMAL LOW (ref 3.5–5.0)
Alkaline Phosphatase: 103 U/L (ref 38–126)
Anion gap: 15 (ref 5–15)
BUN: 19 mg/dL (ref 8–23)
CO2: 25 mmol/L (ref 22–32)
Calcium: 8.7 mg/dL — ABNORMAL LOW (ref 8.9–10.3)
Chloride: 98 mmol/L (ref 98–111)
Creatinine, Ser: 3.12 mg/dL — ABNORMAL HIGH (ref 0.61–1.24)
GFR, Estimated: 21 mL/min — ABNORMAL LOW (ref >60)
Glucose, Bld: 111 mg/dL — ABNORMAL HIGH (ref 70–99)
Potassium: 3.4 mmol/L — ABNORMAL LOW (ref 3.5–5.1)
Sodium: 138 mmol/L (ref 135–145)
Total Bilirubin: 1.2 mg/dL (ref 0.3–1.2)
Total Protein: 6.1 g/dL — ABNORMAL LOW (ref 6.5–8.1)

## 2022-09-22 LAB — PROTIME-INR
INR: 1.2 (ref 0.8–1.2)
Prothrombin Time: 14.9 seconds (ref 11.4–15.2)

## 2022-09-22 LAB — APTT: aPTT: 40 seconds — ABNORMAL HIGH (ref 24–36)

## 2022-09-22 MED ORDER — ONDANSETRON HCL 4 MG/2ML IJ SOLN
4.0000 mg | Freq: Once | INTRAMUSCULAR | Status: AC
  Administered 2022-09-22: 4 mg via INTRAVENOUS
  Filled 2022-09-22: qty 2

## 2022-09-22 MED ORDER — METOPROLOL TARTRATE 25 MG PO TABS
25.0000 mg | ORAL_TABLET | Freq: Once | ORAL | Status: AC
  Administered 2022-09-22: 25 mg via ORAL
  Filled 2022-09-22: qty 1

## 2022-09-22 MED ORDER — METOPROLOL TARTRATE 25 MG PO TABS: 25.0000 mg | ORAL_TABLET | Freq: Two times a day (BID) | ORAL | Status: DC

## 2022-09-22 MED ORDER — METHOCARBAMOL 500 MG PO TABS
500.0000 mg | ORAL_TABLET | Freq: Once | ORAL | Status: AC
  Administered 2022-09-22: 500 mg via ORAL
  Filled 2022-09-22: qty 1

## 2022-09-22 MED ORDER — MIDAZOLAM HCL 2 MG/2ML IJ SOLN
0.5000 mg | Freq: Once | INTRAMUSCULAR | Status: AC
  Administered 2022-09-22: 0.5 mg via INTRAVENOUS
  Filled 2022-09-22: qty 2

## 2022-09-22 MED ORDER — METOPROLOL TARTRATE 25 MG PO TABS: 25.0000 mg | ORAL_TABLET | Freq: Two times a day (BID) | ORAL | 1 refills | Status: DC

## 2022-09-22 MED ORDER — HYDROMORPHONE HCL 1 MG/ML IJ SOLN
0.5000 mg | Freq: Once | INTRAMUSCULAR | Status: AC
  Administered 2022-09-22: 0.5 mg via INTRAVENOUS
  Filled 2022-09-22: qty 1

## 2022-09-22 MED ORDER — POTASSIUM CHLORIDE CRYS ER 20 MEQ PO TBCR: 40.0000 meq | EXTENDED_RELEASE_TABLET | Freq: Once | ORAL | Status: DC

## 2022-09-22 MED ORDER — METOPROLOL TARTRATE 25 MG PO TABS: 25.0000 mg | ORAL_TABLET | Freq: Once | ORAL | Status: DC

## 2022-09-22 MED ORDER — METOPROLOL TARTRATE 5 MG/5ML IV SOLN
5.0000 mg | Freq: Once | INTRAVENOUS | Status: AC
  Administered 2022-09-22: 5 mg via INTRAVENOUS
  Filled 2022-09-22: qty 5

## 2022-09-22 MED ORDER — OXYCODONE HCL 5 MG PO TABS
5.0000 mg | ORAL_TABLET | Freq: Once | ORAL | Status: DC
  Filled 2022-09-22: qty 1

## 2022-09-22 NOTE — Consult Note (Addendum)
Cardiology Consultation   Patient ID: PERSHING SKIDMORE MRN: [id number]; DOB: [birth year]  Admit date: 09/22/2022 Date of Consult: 09/22/2022  PCP:  Babs Sciara, MD   [facility] Providers Cardiologist:  Chilton Si, MD        Patient Profile:   JAMIS KRYDER is a 67 y.o. 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 Anitra Lauth.  History of Present Illness:   Mr. Dewalt presented to the emergency department today with EMS from [facility].  Patient woke up this morning feeling poorly with shortness of breath and rapid heart rate.  Of note patient at rehab secondary to hip fracture surgical repair on 3/30. Per EMS, when they arrived they found BP to be low in the 80s with some transient readings down into the 50s.  Heart rate between 120 and 170.  Patient received approximately 100 mL of fluid with EMS.  Upon arrival to the ED, patient received 5 mg of metoprolol with noted improvement in heart rate.  He was then scheduled to receive an oral dose of metoprolol tartrate, but was found to have returned to normal sinus rhythm prior to receiving.  Patient was recently seen by our inpatient team at [facility] on 3/7 and 3/8.  Cardiology was consulted because patient presented with left side chest pain, sharp rated 10 out of 10 with shortness of breath and palpitations.  EMS brought patient to the emergency department.  Patient was noted to have runs of SVT, transient hypotension with blood pressures into the 60s.  He was initially started on low-dose Levophed but this was stopped shortly after starting.  Patient was placed on amiodarone infusion and had no recurrence of SVT. At discharge, he was transitioned to oral amiodarone, 200  mg twice daily for 3 weeks followed by amiodarone 200 mg daily.  He was also continued on metoprolol succinate 50 mg daily.  On my exam, patient reports that he did have chest pain on the left side of his chest and shortness of breath with elevation of heart rate.  Symptoms have completely resolved with restoration of rhythm.  Since patient was last seen by our cardiology group during his March admission, reports approximately 3 episodes of rapid heart rate and associated chest tightness with shortness of breath.  These episodes were self-limited and resolved within about 30 minutes of onset.  Patient was not able to identify specific provocation or palliation.  It appears that he is now only taking amiodarone 200 mg daily, metoprolol was discontinued following hospitalization for hip fracture.  Outside of chest tightness and shortness of breath with these episodes of rapid heart rate, patient has no complaints of chest pain, palpitations, shortness of breath, dizziness/lightheadedness.  His only daily symptoms are related to right leg/hip pain and fatigue following dialysis sessions.  Past Medical History:  Diagnosis Date   Anemia    Arthritis    hips shoulders   CAD (coronary artery disease) 03/03/2019   Cancer    Esophageal cancer 2022   CHF (congestive heart failure) 2020   Chronic combined systolic and diastolic heart failure 04/26/2018   Admitted 11/14-11/20/19-diuresed 10L   Depression 05/31/2021   Fatigue 10/14/2020   Hypertension    Myocardial infarction    Sleep apnea  uses a bipap machine   Stroke 12/14/2020   no last weakness or paralysis   Type 2 diabetes mellitus with diabetic nephropathy 04/23/2019   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    Past Surgical History:  Procedure Laterality Date   AV FISTULA PLACEMENT Left 12/21/2021   Procedure: LEFT ARM ARTERIOVENOUS (AV) FISTULA CREATION;  Surgeon: Larina Earthly, MD;  Location: [facility];  Service: Vascular;  Laterality: Left;    BIOPSY  06/27/2019   Procedure: BIOPSY;  Surgeon: Malissa Hippo, MD;  Location: [facility];  Service: Endoscopy;;  ascending colon polyp   BIOPSY  10/21/2020   Procedure: BIOPSY;  Surgeon: Malissa Hippo, MD;  [facility]: [facility];  Service: Endoscopy;;  esophageal,gastric polyp   BIOPSY  02/24/2021   Procedure: BIOPSY;  Surgeon: Malissa Hippo, MD;  [facility];  Service: Endoscopy;;  distal and proximal esophageal biopsies    BIOPSY  05/30/2022   Procedure: BIOPSY;  Surgeon: Lanelle Bal, DO;  [facility]: [facility];  Service: Endoscopy;;   CARDIAC SURGERY     CATARACT EXTRACTION     COLONOSCOPY N/A 06/27/2019   [name]:Diverticulosis in the entire examined colon. tubular adenoma in ascending colon, 4mm tubular adenoma in prox sigmoid. external hemorrhoids   CORONARY ARTERY BYPASS GRAFT N/A 03/25/2019   Procedure: CORONARY ARTERY BYPASS GRAFTING (CABG) X  4 USING LEFT INTERNAL MAMMARY ARTERY AND RIGHT SAPHENOUS VEIN GRAFTS;  Surgeon: Corliss Skains, MD;  Location: [facility];  Service: Open Heart Surgery;  Laterality: N/A;   ESOPHAGEAL BRUSHING  03/08/2022   Procedure: ESOPHAGEAL BRUSHING;  Surgeon: Lanelle Bal, DO;  [facility];  Service: Endoscopy;;   ESOPHAGOGASTRODUODENOSCOPY (EGD) WITH PROPOFOL N/A 10/21/2020   rehman:Normal hypopharynx.Normal proximal esophagus and mid esophagus.Esophageal mucosal changes consistent with long-segment Barrett's esophagus. (Focal low-grade dysplasia and atypia proximally) z line irregular 37 cm from incisors, 3 cm HH, single gastric polyp (fundic gland) normal duodenal bulb/second portion of duodenum, proximal margin of Barrett's at 34 cm   ESOPHAGOGASTRODUODENOSCOPY (EGD) WITH PROPOFOL N/A 02/24/2021   Procedure: ESOPHAGOGASTRODUODENOSCOPY (EGD) WITH PROPOFOL;  Surgeon: Malissa Hippo, MD;  [facility];  Service: Endoscopy;  Laterality: N/A;  1:35   ESOPHAGOGASTRODUODENOSCOPY (EGD) WITH  PROPOFOL N/A 03/21/2021   Procedure: ESOPHAGOGASTRODUODENOSCOPY (EGD) WITH PROPOFOL;  Surgeon: Jeani Hawking, MD;  Location: [facility] ENDOSCOPY;  Service: Endoscopy;  Laterality: N/A;   ESOPHAGOGASTRODUODENOSCOPY (EGD) WITH PROPOFOL N/A 10/23/2021   Procedure: ESOPHAGOGASTRODUODENOSCOPY (EGD) WITH PROPOFOL;  Surgeon: Sherrilyn Rist, MD;  Location: [facility] ENDOSCOPY;  Service: Gastroenterology;  Laterality: N/A;   ESOPHAGOGASTRODUODENOSCOPY (EGD) WITH PROPOFOL N/A 03/08/2022   Procedure: ESOPHAGOGASTRODUODENOSCOPY (EGD) WITH PROPOFOL;  Surgeon: Lanelle Bal, DO;  [facility];  Service: Endoscopy;  Laterality: N/A;   ESOPHAGOGASTRODUODENOSCOPY (EGD) WITH PROPOFOL N/A 05/27/2022   Procedure: ESOPHAGOGASTRODUODENOSCOPY (EGD) WITH PROPOFOL;  Surgeon: Dolores Frame, MD;  [facility];  Service: Gastroenterology;  Laterality: N/A;   ESOPHAGOGASTRODUODENOSCOPY (EGD) WITH PROPOFOL N/A 05/30/2022   Procedure: ESOPHAGOGASTRODUODENOSCOPY (EGD) WITH PROPOFOL;  Surgeon: Lanelle Bal, DO;  [facility];  Service: Endoscopy;  Laterality: N/A;   EXCISION MORTON'S NEUROMA     EYE SURGERY Left    retina   INCISION AND DRAINAGE OF WOUND Right 06/28/2021   Procedure: IRRIGATION AND DEBRIDEMENT WOUND;  Surgeon: Allena Napoleon, MD;  Location: [facility];  Service: Plastics;  Laterality: Right;  1.5 hour   INSERTION OF DIALYSIS CATHETER  Right 12/21/2021   Procedure: INSERTION OF TUNNELED DIALYSIS CATHETER;  Surgeon: Larina EarthlyEarly, Todd F, MD;  Location: [facility];  Service: Vascular;  Laterality: Right;   INTRAMEDULLARY (IM) NAIL INTERTROCHANTERIC Right 09/10/2022   Procedure: INTRAMEDULLARY (IM) NAIL INTERTROCHANTERIC;  Surgeon: Joen LauraMarchwiany, Daniel A, MD;  Location: [facility];  Service: Orthopedics;  Laterality: Right;   LEFT HEART CATH AND CORS/GRAFTS ANGIOGRAPHY N/A 02/01/2022   Procedure: LEFT HEART CATH AND CORS/GRAFTS ANGIOGRAPHY;  Surgeon: SwazilandJordan, Peter M, MD;  [facility];   Service: Cardiovascular;  Laterality: N/A;   POLYPECTOMY  06/27/2019   Procedure: POLYPECTOMY;  Surgeon: Malissa Hippoehman, Najeeb U, MD;  [facility];  Service: Endoscopy;;  proximal sigmoid colon   RIGHT/LEFT HEART CATH AND CORONARY ANGIOGRAPHY N/A 03/12/2019   Procedure: RIGHT/LEFT HEART CATH AND CORONARY ANGIOGRAPHY;  Surgeon: Corky CraftsVaranasi, Jayadeep S, MD;  [facility];  Service: Cardiovascular;  Laterality: N/A;   SHOULDER ARTHROSCOPY WITH ROTATOR CUFF REPAIR AND SUBACROMIAL DECOMPRESSION Right 08/26/2020   Procedure: RIGHT SHOULDER MINI OPEN ROTATOR CUFF REPAIR AND SUBACROMIAL DECOMPRESSION WITH PATCH GRAFT;  Surgeon: Jene EveryBeane, Jeffrey, MD;  Location: [facility];  Service: Orthopedics;  Laterality: Right;  90 MINS GENERAL WITH BLOCK   SKIN SPLIT GRAFT Right 06/28/2021   Procedure: SKIN GRAFT SPLIT THICKNESS;  Surgeon: Allena NapoleonPace, Collier S, MD;  Location: [facility];  Service: Plastics;  Laterality: Right;   TEE WITHOUT CARDIOVERSION N/A 03/25/2019   Procedure: TRANSESOPHAGEAL ECHOCARDIOGRAM (TEE);  Surgeon: Corliss SkainsLightfoot, Harrell O, MD;  [facility];  Service: Open Heart Surgery;  Laterality: N/A;     Home Medications:  Prior to Admission medications   Medication Sig Start Date End Date Taking? Authorizing Provider  acetaminophen (TYLENOL) 325 MG tablet Take 2 tablets (650 mg total) by mouth every 6 (six) hours as needed for mild pain, moderate pain or fever. 08/02/21   Drema DallasWoods, Curtis J, MD  albuterol (VENTOLIN HFA) 108 (90 Base) MCG/ACT inhaler INHALE TWO PUFFS BY MOUTH EVERY 6 HOURS AS NEEDED Patient taking differently: Inhale 2 puffs into the lungs every 6 (six) hours as needed for wheezing or shortness of breath. 08/11/22   Babs SciaraLuking, Scott A, MD  amiodarone (PACERONE) 200 MG tablet Take 1 tablet (200 mg total) by mouth 2 (two) times daily. Through 09/08/22.  Then 1 tab (200 mg) once daily starting 09/09/22 09/17/22   Jerald Kiefhiu, Stephen K, MD  bisacodyl (DULCOLAX) 10 MG suppository Place 1 suppository  (10 mg total) rectally daily as needed for moderate constipation. 09/17/22   Jerald Kiefhiu, Stephen K, MD  calcium acetate (PHOSLO) 667 MG capsule Take 667 mg by mouth in the morning, at noon, and at bedtime.    [provider]  docusate sodium (COLACE) 100 MG capsule Take 1 capsule (100 mg total) by mouth 2 (two) times daily. 09/17/22 10/17/22  Jerald Kiefhiu, Stephen K, MD  doxazosin (CARDURA) 2 MG tablet TAKE 1 TABLET BY MOUTH DAILY 07/29/22   Babs SciaraLuking, Scott A, MD  enoxaparin (LOVENOX) 30 MG/0.3ML injection Inject 0.3 mLs (30 mg total) into the skin daily for 21 days. 09/17/22 10/08/22  Jerald Kiefhiu, Stephen K, MD  EPINEPHrine 0.3 mg/0.3 mL IJ SOAJ injection Inject 0.3 mg into the muscle as needed for anaphylaxis. 01/25/21   Babs SciaraLuking, Scott A, MD  Evolocumab (REPATHA SURECLICK) 140 MG/ML SOAJ Inject 140 mg into the skin every 14 (fourteen) days. 06/29/22   Alver SorrowWalker, Caitlin S, NP  hydrALAZINE (APRESOLINE) 50 MG tablet Take 1 tablet (50 mg total) by mouth 3 (three) times  daily. 06/23/22   Shon Hale, MD  LANTUS 100 UNIT/ML injection 8 mLs daily. Sliding scale 04/07/22   [provider]  methocarbamol (ROBAXIN) 500 MG tablet Take 1 tablet (500 mg total) by mouth every 8 (eight) hours as needed for muscle spasms. 09/17/22   Jerald Kief, MD  nitroGLYCERIN (NITROSTAT) 0.4 MG SL tablet Place 1 tablet (0.4 mg total) under the tongue every 5 (five) minutes as needed for chest pain. 09/14/21   Alver Sorrow, NP  NOVOLOG FLEXPEN 100 UNIT/ML FlexPen Sliding scale AC/HS: CBG 70 - 120: 0 units  CBG 121 - 150: 2 units  CBG 151 - 200: 3 units  CBG 201 - 250: 5 units  CBG 251 - 300: 8 units  CBG 301 - 350: 11 units  CBG 351 - 400: 15 units  CBG > 400: call MD 09/17/22   Jerald Kief, MD  ondansetron (ZOFRAN-ODT) 4 MG disintegrating tablet Take 1 tablet by mouth every 8 hours as needed for nausea and vomiting. 08/02/22   Alver Sorrow, NP  oxyCODONE (OXY IR/ROXICODONE) 5 MG immediate release tablet Take 1-2 tablets (5-10  mg total) by mouth every 4 (four) hours as needed for moderate pain or severe pain. 09/17/22   Jerald Kief, MD  pantoprazole (PROTONIX) 40 MG tablet Take 1 tablet (40 mg total) by mouth 2 (two) times daily. 05/30/22   Catarina Hartshorn, MD  polyethylene glycol (MIRALAX) 17 g packet Take 17 g by mouth 2 (two) times daily. 09/17/22   Jerald Kief, MD  senna-docusate (SENOKOT-S) 8.6-50 MG tablet Take 1 tablet by mouth at bedtime. 09/17/22 10/17/22  Jerald Kief, MD  sertraline (ZOLOFT) 100 MG tablet TAKE 1 TABLET BY MOUTH DAILY 07/29/22   Babs Sciara, MD    Inpatient Medications: Scheduled Meds:   HYDROmorphone (DILAUDID) injection  0.5 mg Intravenous Once   oxyCODONE  5 mg Oral Once   Continuous Infusions:  PRN Meds:   Allergies:    Allergies  Allergen Reactions   Bee Venom Anaphylaxis   Atorvastatin Other (See Comments)    myalgia   Diltiazem Itching   Lopressor [Metoprolol]     NIGHTMARES    Reglan [Metoclopramide] Other (See Comments)    Suicidal    Rosuvastatin Other (See Comments)    myalgias   Valsartan Itching    Social History:   Social History   Socioeconomic History   Marital status: Single    Spouse name: Not on file   Number of children: Not on file   Years of education: 12   Highest education level: 12th grade  Occupational History   Not on file  Tobacco Use   Smoking status: Former    Types: Cigarettes    Quit date: 07/06/1988    Years since quitting: 34.2    Passive exposure: Past   Smokeless tobacco: Never  Vaping Use   Vaping Use: Never used  Substance and Sexual Activity   Alcohol use: Not Currently    Alcohol/week: 2.0 standard drinks of alcohol    Types: 2 Glasses of wine per week    Comment: socially   Drug use: No   Sexual activity: Not Currently    Partners: Female  Other Topics Concern   Not on file  Social History Narrative   Not on file   Social Determinants of Health   Financial Resource Strain: Low Risk  (04/21/2022)   Overall  Financial Resource Strain (CARDIA)    Difficulty  of Paying Living Expenses: Not hard at all  Food Insecurity: No Food Insecurity (09/09/2022)   Hunger Vital Sign    Worried About Running Out of Food in the Last Year: Never true    Ran Out of Food in the Last Year: Never true  Transportation Needs: Unmet Transportation Needs (09/09/2022)   PRAPARE - Administrator, Civil Service (Medical): Yes    Lack of Transportation (Non-Medical): No  Physical Activity: Inactive (04/21/2022)   Exercise Vital Sign    Days of Exercise per Week: 0 days    Minutes of Exercise per Session: 0 min  Stress: No Stress Concern Present (04/21/2022)   [employer] of Occupational Health - Occupational Stress Questionnaire    Feeling of Stress : Only a little  Social Connections: Unknown (04/21/2022)   Social Connection and Isolation Panel [NHANES]    Frequency of Communication with Friends and Family: More than three times a week    Frequency of Social Gatherings with Friends and Family: More than three times a week    Attends Religious Services: More than 4 times per year    Active Member of [employer] or Organizations: Yes    Attends Engineer, structural: More than 4 times per year    Marital Status: Not on file  Intimate Partner Violence: Not At Risk (09/09/2022)   Humiliation, Afraid, Rape, and Kick questionnaire    Fear of Current or Ex-Partner: No    Emotionally Abused: No    Physically Abused: No    Sexually Abused: No    Family History:    Family History  Problem Relation Age of Onset   Colon cancer Mother    Heart Problems Father    Diabetes Father    Valvular heart disease Father    Sleep apnea Neg Hx    Stroke Neg Hx      ROS:  Please see the history of present illness.   All other ROS reviewed and negative.     Physical Exam/Data:   Vitals:   09/22/22 1141 09/22/22 1142 09/22/22 1143 09/22/22 1145  BP:    94/61  Pulse: (!) 133 (!) 125  (!) 130  Resp: (!) 36 17     Temp:   98.6 F (37 C)   TempSrc:   Oral   SpO2: 97% 95%     No intake or output data in the 24 hours ending 09/22/22 1340    09/17/2022    1:47 PM 09/15/2022    2:32 PM 09/15/2022   10:40 AM  Last 3 Weights  Weight (lbs) 185 lb 6.5 oz 187 lb 6.3 oz 205 lb 11 oz  Weight (kg) 84.1 kg 85 kg 93.3 kg     There is no height or weight on file to calculate BMI.  General:  Well nourished, well developed, in no acute distress HEENT: normal Neck: no JVD Vascular: No carotid bruits; Distal pulses 2+ bilaterally Cardiac:  normal S1, S2; RRR with frequent PVCs; no murmur  Lungs:  clear to auscultation bilaterally, no wheezing, rhonchi or rales  Abd: soft, nontender, no hepatomegaly  Ext: No edema.  Patient's left upper arm has notable bruising around the site of his dialysis fistula.  Says that this developed following dialysis yesterday. Musculoskeletal:  No deformities, BUE and BLE strength normal and equal Skin: warm and dry  Neuro:  CNs 2-12 intact, no focal abnormalities noted Psych:  Normal affect   EKG:  The EKG was personally reviewed and  demonstrates: Initial ECG concerning for atrial fibrillation with rapid ventricular response.  Second ECG today postconversion shows normal sinus rhythm without acute ischemic change. Telemetry:  Telemetry was personally reviewed and demonstrates: Initially A-fib with RVR, conversion to sinus rhythm just after noon, now with frequent PVCs.  Relevant CV Studies:  08/18/22 TTE  IMPRESSIONS     1. Left ventricular ejection fraction, by estimation, is 50%. The left  ventricle has low normal function. The left ventricle has no regional wall  motion abnormalities. There is moderate left ventricular hypertrophy. Left  ventricular diastolic parameters   are consistent with Grade II diastolic dysfunction (pseudonormalization).  Elevated left atrial pressure.   2. Right ventricular systolic function is normal. The right ventricular  size is mildly  enlarged. There is severely elevated pulmonary artery  systolic pressure.   3. Left atrial size was severely dilated.   4. Right atrial size was severely dilated.   5. The mitral valve is abnormal. Trivial mitral valve regurgitation.   6. The tricuspid valve is abnormal.   7. The aortic valve has an indeterminant number of cusps. Aortic valve  regurgitation is not visualized. No aortic stenosis is present.   8. The inferior vena cava is dilated in size with <50% respiratory  variability, suggesting right atrial pressure of 15 mmHg.   FINDINGS   Left Ventricle: Left ventricular ejection fraction, by estimation, is  50%. The left ventricle has low normal function. The left ventricle has no  regional wall motion abnormalities. Definity contrast agent was given IV  to delineate the left ventricular  endocardial borders. The left ventricular internal cavity size was normal  in size. There is moderate left ventricular hypertrophy. Left ventricular  diastolic parameters are consistent with Grade II diastolic dysfunction  (pseudonormalization). Elevated  left atrial pressure.   Right Ventricle: The right ventricular size is mildly enlarged. Right  vetricular wall thickness was not well visualized. Right ventricular  systolic function is normal. There is severely elevated pulmonary artery  systolic pressure. The tricuspid  regurgitant velocity is 3.61 m/s, and with an assumed right atrial  pressure of 15 mmHg, the estimated right ventricular systolic pressure is  67.1 mmHg.   Left Atrium: Left atrial size was severely dilated.   Right Atrium: Right atrial size was severely dilated.   Pericardium: There is no evidence of pericardial effusion.   Mitral Valve: The mitral valve is abnormal. There is mild thickening of  the mitral valve leaflet(s). There is mild calcification of the mitral  valve leaflet(s). Mild mitral annular calcification. Trivial mitral valve  regurgitation. MV peak  gradient, 6.9  mmHg. The mean mitral valve gradient is 2.0 mmHg.   Tricuspid Valve: The tricuspid valve is abnormal. Tricuspid valve  regurgitation is mild . No evidence of tricuspid stenosis.   Aortic Valve: The aortic valve has an indeterminant number of cusps.  Aortic valve regurgitation is not visualized. No aortic stenosis is  present. Aortic valve mean gradient measures 4.7 mmHg. Aortic valve peak  gradient measures 10.8 mmHg. Aortic valve  area, by VTI measures 2.29 cm.   Pulmonic Valve: The pulmonic valve was not well visualized. Pulmonic valve  regurgitation is not visualized. No evidence of pulmonic stenosis.   Aorta: The aortic root is normal in size and structure.   Venous: The inferior vena cava is dilated in size with less than 50%  respiratory variability, suggesting right atrial pressure of 15 mmHg.   IAS/Shunts: No atrial level shunt detected by color flow Doppler.  02/01/22 LHC    Mid LM to Dist LM lesion is 25% stenosed.   Ost LAD to Prox LAD lesion is 50% stenosed.   Ost Cx to Prox Cx lesion is 90% stenosed.   Dist RCA-1 lesion is 90% stenosed.   Mid RCA lesion is 75% stenosed.   1st Diag lesion is 80% stenosed.   2nd Diag lesion is 75% stenosed.   1st Mrg lesion is 80% stenosed.   2nd Mrg lesion is 80% stenosed.   Mid LAD lesion is 100% stenosed.   RPAV lesion is 80% stenosed.   Dist RCA-2 lesion is 100% stenosed.   SVG graft was visualized by angiography and is normal in caliber.   SVG graft was visualized by angiography and is normal in caliber.   LIMA graft was visualized by angiography and is large.   SVG graft was visualized by angiography and is normal in caliber.   The graft exhibits no disease.   The graft exhibits no disease.   The graft exhibits no disease.   The graft exhibits no disease.   LV end diastolic pressure is mildly elevated.   Severe 3 vessel obstructive CAD Patent LIMA to the LAD Patent SVG to large second diagonal Patent  SVG to OM Patent SVG to PL branch of the RCA Elevated LVEDP 25 mm Hg   Plan: continue medical management.  Laboratory Data:  High Sensitivity Troponin:  No results for input(s): "TROPONINIHS" in the last 720 hours.   Chemistry Recent Labs  Lab 09/16/22 1039 09/17/22 0801 09/22/22 1131  NA 135 134* 138  K 4.2 4.5 3.4*  CL 96* 98 98  CO2 24 25 25   GLUCOSE 238* 192* 111*  BUN 40* 48* 19  CREATININE 4.52* 5.00* 3.12*  CALCIUM 8.7* 8.4* 8.7*  GFRNONAA 14* 12* 21*  ANIONGAP 15 11 15     Recent Labs  Lab 09/16/22 1039 09/22/22 1131  PROT 6.4* 6.1*  ALBUMIN 2.9* 2.9*  AST 10* 16  ALT <5 7  ALKPHOS 106 103  BILITOT 0.7 1.2   Lipids No results for input(s): "CHOL", "TRIG", "HDL", "LABVLDL", "LDLCALC", "CHOLHDL" in the last 168 hours.  Hematology Recent Labs  Lab 09/16/22 1039 09/17/22 0325 09/22/22 1131  WBC 8.6 8.2 7.8  RBC 3.02* 3.01* 3.46*  HGB 8.8* 8.6* 9.8*  HCT 27.6* 27.3* 32.4*  MCV 91.4 90.7 93.6  MCH 29.1 28.6 28.3  MCHC 31.9 31.5 30.2  RDW 18.3* 17.8* 17.0*  PLT 299 289 365   Thyroid No results for input(s): "TSH", "FREET4" in the last 168 hours.  BNPNo results for input(s): "BNP", "PROBNP" in the last 168 hours.  DDimer No results for input(s): "DDIMER" in the last 168 hours.   Radiology/Studies:  [facility] Chest Port 1 View  Result Date: 09/22/2022 CLINICAL DATA:  Chest pain, shortness of breath. EXAM: PORTABLE CHEST 1 VIEW COMPARISON:  August 18, 2022. FINDINGS: Stable cardiomegaly. Status post coronary bypass graft. Mild central pulmonary vascular congestion is noted. No consolidative process is noted. Bony thorax is unremarkable. IMPRESSION: Stable cardiomegaly with mild central pulmonary vascular congestion. Electronically Signed   By: Lupita Raider M.D.   On: 09/22/2022 12:19     Assessment and Plan:   Tachy-arrhythmia, suspected afib with RVR Hx SVT Frequent PVCs  Patient presented to the emergency department today from his rehab facility with  EMS for evaluation and management of rapid heart rate with low blood pressure.  Patient also seen in March for similar symptoms, though arrhythmia  at that time was more regular and felt to be SVT. He received 8g Amiodarone load and has been on 200mg  daily for 12 days. Reports 3 episodes of rapid HR lasting less than 30 min since discharge.   Patient's presenting tachyarrhythmia appears to be atrial fibrillation with RVR.  On review of telemetry, rhythm post metoprolol initially sinus with paroxysmal afib, then sinus with PVCs. This is a very difficult situation given esophageal cancer and anemia. Despite CHA2DS2-VASc Score = 7 risk of bleeding on DOAC may out-weigh stroke prophylaxis benefit. Will discuss with MD. Continue Amiodarone 200mg  daily. Would consider adding back daily Metoprolol if BP allows to assist with rate control.  It appears that patient's average blood pressure would be adequate to support this.  Low BP strongly associated with tachyarrhythmia.  CAD s/p CABG 2020 (LIMA to LAD, SVG to D2, OM and PLV)   Left heart cath in August 2023 showing patent grafts. Patient with chest tightness strongly associated with episodes of tachycardia. Given lack of anginal symptoms otherwise, no indication for repeat ischemic evaluation at this time.  Combined systolic and diastolic CHF  08/18/22 TTE with LVEF 50%, grade II diastolic dysfunction, severely elevated pulmonary artery pressure. Appears euvolemic on exam today. Continue volume management with HD.  Hypertension  Patient with transiently low blood pressure in the setting of rapid heart rate.  Otherwise reports that typical blood pressures are around 130/80 mmHg.  Patient's only listed antihypertensive medication today is hydralazine 50 mg 3 times daily.  Could consider decreasing dose to allow adding a daily beta-blocker.  Hyperlipidemia  Continue Repatha.  Risk Assessment/Risk Scores:          CHA2DS2-VASc Score = 7   This  indicates a 11.2% annual risk of stroke. The patient's score is based upon: CHF History: 1 HTN History: 1 Diabetes History: 1 Stroke History: 2 Vascular Disease History: 1 Age Score: 1 Gender Score: 0         For questions or updates, please contact [facility] Please consult [email or web address] for contact info under    Signed, Perlie Gold, PA-C  09/22/2022 1:40 PM   Patient seen and examined and agree with Perlie Gold, PA-C as detailed above.  In brief, the patient is a 67 y.o. 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  Patient recently admitted to [facility] with SVT with associated hypotension. HR at that time 160s. He converted and was placed on amiodarone 200mg  x3 weeks and then 200mg  daily at discharge. TTE that admission with LVEF 50%, G2DD, normal RV, severe pulmonary HTN, severe biatrial enlargement.  He states that since being in rehab, he has had intermittent episodes of palpitations that would resolve on their own until today when the episode sustained prompting him to come to the ER. On admission, HR 140s in Afib that improved to 100s with IV metop and then subsequently converted to NSR with frequent PVCs. His SOB and chest tightness have resolved with restoration of NSR.  Discussed with the patient and his family at bedside. While he has a significantly elevated risk of stroke with CHADs-vasc of 7, he is very high risk for bleeding with underlying esophageal carcinoma, ESRD with chronic anemia and frequent falls. Favor holding  off on AC at this time and patient and family agrees. Will continue current dose of amiodarone 200mg  daily (has completed a significant load already) and will start back metoprolol  25mg  BID with additional dose as needed. He has follow-up 09/29/22 with our office to discuss further.   GEN: No acute distress.   Neck: JVD mildly elevated Cardiac: Irregular, 2/6 systolic murmur Respiratory: Clear to auscultation bilaterally. GI: Soft, nontender, non-distended  MS: LUE fistula in place with surrounding bruising. No edema Neuro:  Nonfocal  Psych: Normal affect    Plan: -Discussed risk vs benefits of AC and given esophageal carcinoma, ESRD with anemia, and history of falls, will hold AC at this time due to significant concern for bleed -Will continue amiodarone 200mg  daily as already received 3 weeks of BID dosing -Start metop 25mg  BID with additional dose as needed for palpitations -Patient has follow-up in our office on 09/29/22 to discuss further  Laurance Flatten, MD

## 2022-09-22 NOTE — Discharge Instructions (Addendum)
He will continue all your current medication but in addition you will start back on the medication metoprolol for heart rate control.  You will take 1 tab 2 times a day and you did receive your first dose here so you do not need another dose until tomorrow 09/23/22.  You typically will only take the medication 2 times a day but if your heart rate starts running high more than 120 you can take an additional dose of medication.  It will be very important to follow-up with the cardiologist on 09/29/2022 as planned.  At this time continue the Lovenox shots.

## 2022-09-22 NOTE — ED Provider Notes (Addendum)
[place] EMERGENCY DEPARTMENT AT [facility] Provider Note   CSN: [id number] Arrival date & time: 09/22/22  1124     History  Chief Complaint  Patient presents with   A fib RVR   Hypotension    [name] is a 67 y.o. male.  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  Patient reports that since waking up this morning he has felt bad.  He is felt short of breath and his heart has been racing.  Patient last had a full course of dialysis yesterday.  He is at rehab because of a hip replacement that he had several weeks ago.  Patient reports he has been having really bad leg cramps in the last few days but did not have the symptoms till this morning.  EMS reports when they arrived blood pressure was in the 80s and then dropped into the 50s with heart rates anywhere between 120-170.  Patient's blood sugar was normal.  They had given him approximately 50 to 100 mL of fluid.  Patient initially denies having chest pain however when heart rate elevated he reports there was a pain in the left side of his chest.  He is complaining of shortness of breath and wears 2 L of oxygen chronically.  His biggest complaint is of severe cramps in his right leg  The history is provided by the patient, medical records and the EMS personnel.       Home Medications Prior to Admission medications   Medication Sig Start Date End Date Taking? Authorizing Provider  acetaminophen (TYLENOL) 325 MG tablet Take 2 tablets (650 mg total) by mouth every 6 (six) hours as needed for mild pain, moderate pain or fever. 08/02/21   Drema Dallas, MD  albuterol (VENTOLIN HFA) 108 (90 Base) MCG/ACT inhaler INHALE TWO PUFFS BY MOUTH EVERY 6 HOURS AS NEEDED Patient  taking differently: Inhale 2 puffs into the lungs every 6 (six) hours as needed for wheezing or shortness of breath. 08/11/22   Babs Sciara, MD  amiodarone (PACERONE) 200 MG tablet Take 1 tablet (200 mg total) by mouth 2 (two) times daily. Through 09/08/22.  Then 1 tab (200 mg) once daily starting 09/09/22 09/17/22   Jerald Kief, MD  bisacodyl (DULCOLAX) 10 MG suppository Place 1 suppository (10 mg total) rectally daily as needed for moderate constipation. 09/17/22   Jerald Kief, MD  calcium acetate (PHOSLO) 667 MG capsule Take 667 mg by mouth in the morning, at noon, and at bedtime.    [provider]  docusate sodium (COLACE) 100 MG capsule Take 1 capsule (100 mg total) by mouth 2 (two) times daily. 09/17/22 10/17/22  Jerald Kief, MD  doxazosin (CARDURA) 2 MG tablet TAKE 1 TABLET BY MOUTH DAILY 07/29/22   Babs Sciara, MD  enoxaparin (LOVENOX) 30 MG/0.3ML injection Inject 0.3 mLs (30 mg total) into the skin daily for 21 days. 09/17/22 10/08/22  Jerald Kief, MD  EPINEPHrine 0.3 mg/0.3 mL IJ SOAJ injection Inject 0.3 mg into the muscle as needed for anaphylaxis. 01/25/21   Babs Sciara, MD  Evolocumab (REPATHA SURECLICK) 140 MG/ML SOAJ Inject 140 mg into the skin every 14 (fourteen) days. 06/29/22   Alver Sorrow, NP  hydrALAZINE (APRESOLINE) 50 MG tablet  Take 1 tablet (50 mg total) by mouth 3 (three) times daily. 06/23/22   Shon Hale, MD  LANTUS 100 UNIT/ML injection 8 mLs daily. Sliding scale 04/07/22   [provider]  methocarbamol (ROBAXIN) 500 MG tablet Take 1 tablet (500 mg total) by mouth every 8 (eight) hours as needed for muscle spasms. 09/17/22   Jerald Kief, MD  nitroGLYCERIN (NITROSTAT) 0.4 MG SL tablet Place 1 tablet (0.4 mg total) under the tongue every 5 (five) minutes as needed for chest pain. 09/14/21   Alver Sorrow, NP  NOVOLOG FLEXPEN 100 UNIT/ML FlexPen Sliding scale AC/HS: CBG 70 - 120: 0 units  CBG 121 - 150: 2 units  CBG 151 - 200: 3  units  CBG 201 - 250: 5 units  CBG 251 - 300: 8 units  CBG 301 - 350: 11 units  CBG 351 - 400: 15 units  CBG > 400: call MD 09/17/22   Jerald Kief, MD  ondansetron (ZOFRAN-ODT) 4 MG disintegrating tablet Take 1 tablet by mouth every 8 hours as needed for nausea and vomiting. 08/02/22   Alver Sorrow, NP  oxyCODONE (OXY IR/ROXICODONE) 5 MG immediate release tablet Take 1-2 tablets (5-10 mg total) by mouth every 4 (four) hours as needed for moderate pain or severe pain. 09/17/22   Jerald Kief, MD  pantoprazole (PROTONIX) 40 MG tablet Take 1 tablet (40 mg total) by mouth 2 (two) times daily. 05/30/22   Catarina Hartshorn, MD  polyethylene glycol (MIRALAX) 17 g packet Take 17 g by mouth 2 (two) times daily. 09/17/22   Jerald Kief, MD  senna-docusate (SENOKOT-S) 8.6-50 MG tablet Take 1 tablet by mouth at bedtime. 09/17/22 10/17/22  Jerald Kief, MD  sertraline (ZOLOFT) 100 MG tablet TAKE 1 TABLET BY MOUTH DAILY 07/29/22   Babs Sciara, MD      Allergies    Bee venom, Atorvastatin, Diltiazem, Lopressor [metoprolol], Reglan [metoclopramide], Rosuvastatin, and Valsartan    Review of Systems   Review of Systems  Physical Exam Updated Vital Signs BP 94/61   Pulse (!) 130   Temp 98.6 F (37 C) (Oral)   Resp 17   SpO2 95%  Physical Exam Vitals and nursing note reviewed.  Constitutional:      General: He is in acute distress.     Appearance: He is well-developed. He is ill-appearing and diaphoretic.  HENT:     Head: Normocephalic and atraumatic.  Eyes:     Conjunctiva/sclera: Conjunctivae normal.     Pupils: Pupils are equal, round, and reactive to light.  Cardiovascular:     Rate and Rhythm: Regular rhythm. Tachycardia present.     Heart sounds: No murmur heard. Pulmonary:     Effort: Pulmonary effort is normal. No respiratory distress.     Breath sounds: Normal breath sounds. No wheezing or rales.  Abdominal:     General: There is no distension.     Palpations: Abdomen is soft.      Tenderness: There is no abdominal tenderness. There is no guarding or rebound.  Musculoskeletal:        General: Tenderness present. Normal range of motion.     Cervical back: Normal range of motion and neck supple.     Comments: Pain with palpation of the leg on the right.  Surgical wounds are covered with bandages and the bandages are clean and dry.  Faint pulses noted in bilateral lower extremities.  Capillary refill is less than 3 seconds  bilaterally  Skin:    General: Skin is warm.     Findings: No erythema or rash.  Neurological:     Mental Status: He is alert and oriented to person, place, and time. Mental status is at baseline.  Psychiatric:        Mood and Affect: Mood normal.        Behavior: Behavior normal.     ED Results / Procedures / Treatments   Labs (all labs ordered are listed, but only abnormal results are displayed) Labs Reviewed  CBC WITH DIFFERENTIAL/PLATELET - Abnormal; Notable for the following components:      Result Value   RBC 3.46 (*)    Hemoglobin 9.8 (*)    HCT 32.4 (*)    RDW 17.0 (*)    All other components within normal limits  COMPREHENSIVE METABOLIC PANEL - Abnormal; Notable for the following components:   Potassium 3.4 (*)    Glucose, Bld 111 (*)    Creatinine, Ser 3.12 (*)    Calcium 8.7 (*)    Total Protein 6.1 (*)    Albumin 2.9 (*)    GFR, Estimated 21 (*)    All other components within normal limits  APTT - Abnormal; Notable for the following components:   aPTT 40 (*)    All other components within normal limits  PROTIME-INR    EKG EKG Interpretation  Date/Time:  Thursday September 22 2022 11:27:27 EDT Ventricular Rate:  135 PR Interval:    QRS Duration: 124 QT Interval:  354 QTC Calculation: 531 R Axis:   114 Text Interpretation: Atrial fibrillation with rapid ventricular response Nonspecific intraventricular conduction delay Inferoposterior infarct, recent Abnrm T, consider ischemia, anterolateral lds ST depression V1-V3,  suggest recording posterior leads Confirmed by Gwyneth Sproutlunkett, Kyriana Yankee ([id number]) on 09/22/2022 11:32:58 AM  Radiology DG Chest Port 1 View  Result Date: 09/22/2022 CLINICAL DATA:  Chest pain, shortness of breath. EXAM: PORTABLE CHEST 1 VIEW COMPARISON:  August 18, 2022. FINDINGS: Stable cardiomegaly. Status post coronary bypass graft. Mild central pulmonary vascular congestion is noted. No consolidative process is noted. Bony thorax is unremarkable. IMPRESSION: Stable cardiomegaly with mild central pulmonary vascular congestion. Electronically Signed   By: Lupita RaiderJames  Green Jr M.D.   On: 09/22/2022 12:19    Procedures Procedures    Medications Ordered in ED Medications  methocarbamol (ROBAXIN) tablet 500 mg (has no administration in time range)  midazolam (VERSED) injection 0.5 mg (0.5 mg Intravenous Given 09/22/22 1137)  ondansetron (ZOFRAN) injection 4 mg (4 mg Intravenous Given 09/22/22 1146)  metoprolol tartrate (LOPRESSOR) injection 5 mg (5 mg Intravenous Given 09/22/22 1146)    ED Course/ Medical Decision Making/ A&P                             Medical Decision Making Amount and/or Complexity of Data Reviewed Independent Historian: EMS External Data Reviewed: notes. Labs: ordered. Decision-making details documented in ED Course. Radiology: ordered and independent interpretation performed. Decision-making details documented in ED Course. ECG/medicine tests: ordered and independent interpretation performed. Decision-making details documented in ED Course.  Risk Prescription drug management.   Pt with multiple medical problems and comorbidities and presenting today with a complaint that caries a high risk for morbidity and mortality.  Here today with palpitations and feeling generally unwell.  Patient was noted to be hypotensive by EMS and tachycardic.  I independently interpreted patient's EKG here which shows a narrow fast rhythm consistent with A-fib RVR.  He did have some ST depression which  is most likely related to rate.  No evidence of STEMI at this time.  In the past patient has never been diagnosed with A-fib but has been diagnosed with a supraventricular tachycardia and is on amiodarone for that.  He has been anticoagulated on Lovenox since his hip surgery.  Patient's initial blood pressure here was 72/50 but he is mentating normally has a good radial pulse.  Heart rate will range between 117-140.  When heart rate speeds up patient starts having chest discomfort.  Sats have been normal.  Repeat blood pressure was 92/58.  Patient was placed on continue cardiac monitoring and pads.  During his last hospitalization after hip replacement his metoprolol was discontinued and he was only on the amiodarone.  Will try a dose of metoprolol to see if we can achieve rate control.  Evaluating for new anemia, electrolyte disturbances or other causes for his abnormal rhythm today.  If patient remains with low blood pressure and elevated heart rate will consider cardioversion emergently.  Patient is mentating normally.  He is having terrible cramps in his right leg and was given a dose of Versed.  No evidence of ischemic limb at this time.  12:44 PM Patient received 5 mg of metoprolol with improvement in heart rate now to 101.  Blood pressure has remained in the high 90s systolic with maps greater than 60.  I independently interpreted patient's labs and PT T is elevated at 40, INR is normal, CBC with improved hemoglobin now at 9.8 with normal white count, CMP with potassium of 3.4 but otherwise everything else is normal. Patient is feeling much better after above medications.  12:44 PM On repeat evaluation prior to getting oral metoprolol patient has converted back to a sinus rhythm.  Blood pressure now in the 100s.  Patient still having significant discomfort in the leg but overall is improved.  Patient's family member is at bedside and findings were discussed with him.  Given patient's significant  hypotension soon, A-fib with recent medication changes, he is not currently anticoagulated and does have history of prior falls will consult cardiology for further eval and recommendation.  4:16 PM Cardiology evaluated the patient and at this time we will have him continue the Lovenox until he follows up on 09/29/2022.  Patient will also resume metoprolol 25 mg twice daily with the third dose to use as needed for heart rates greater than 120.  Patient was given a prescription for this.  He and his sister are comfortable with this plan.  He will be discharged back to his rehab facility and will plan on going to dialysis tomorrow as planned.     CHA2DS2-VASc Score = 7   This indicates a 11.2% annual risk of stroke. The patient's score is based upon: CHF History: 1 HTN History: 1 Diabetes History: 1 Stroke History: 2 Vascular Disease History: 1 Age Score: 1 Gender Score: 0     CRITICAL CARE Performed by: Versie Fleener Total critical care time: 30 minutes Critical care time was exclusive of separately billable procedures and treating other patients. Critical care was necessary to treat or prevent imminent or life-threatening deterioration. Critical care was time spent personally by me on the following activities: development of treatment plan with patient and/or surrogate as well as nursing, discussions with consultants, evaluation of patient's response to treatment, examination of patient, obtaining history from patient or surrogate, ordering and performing treatments and interventions, ordering and review of laboratory studies,  ordering and review of radiographic studies, pulse oximetry and re-evaluation of patient's condition.         Final Clinical Impression(s) / ED Diagnoses Final diagnoses:  Atrial fibrillation with RVR  Other specified hypotension    Rx / DC Orders ED Discharge Orders     None         Gwyneth Sprout, MD 09/22/22 1244    Gwyneth Sprout,  MD 09/22/22 1309    Gwyneth Sprout, MD 09/22/22 [id number]

## 2022-09-22 NOTE — ED Notes (Signed)
PTAR called to [facility] ETA 1.5 hrs

## 2022-09-22 NOTE — ED Triage Notes (Signed)
Patient BIB EMS from [facility] with c/o hypotension SBP 80's. Patient was found in A-fib RVR 118-170's.22g Right hand. IVF Given.

## 2022-09-22 NOTE — ED Notes (Signed)
Pt requested RN to contact sister, Amy.  RN updated sister. Pt stated updates can be provided to sister.   Amy stated pt has hx of SVT that treated with amiodarone. Amy informed pt Afib RVR

## 2022-09-24 NOTE — Telephone Encounter (Signed)
This form was filled out thank you 

## 2022-09-26 DIAGNOSIS — D649 Anemia, unspecified: Secondary | ICD-10-CM | POA: Diagnosis not present

## 2022-09-26 DIAGNOSIS — F32A Depression, unspecified: Secondary | ICD-10-CM | POA: Diagnosis not present

## 2022-09-26 DIAGNOSIS — I10 Essential (primary) hypertension: Secondary | ICD-10-CM | POA: Diagnosis not present

## 2022-09-26 DIAGNOSIS — E876 Hypokalemia: Secondary | ICD-10-CM | POA: Diagnosis not present

## 2022-09-26 DIAGNOSIS — N186 End stage renal disease: Secondary | ICD-10-CM | POA: Diagnosis not present

## 2022-09-26 DIAGNOSIS — Z992 Dependence on renal dialysis: Secondary | ICD-10-CM | POA: Diagnosis not present

## 2022-09-26 DIAGNOSIS — R110 Nausea: Secondary | ICD-10-CM | POA: Diagnosis not present

## 2022-09-27 DIAGNOSIS — R262 Difficulty in walking, not elsewhere classified: Secondary | ICD-10-CM | POA: Diagnosis not present

## 2022-09-27 DIAGNOSIS — K5900 Constipation, unspecified: Secondary | ICD-10-CM | POA: Diagnosis not present

## 2022-09-27 DIAGNOSIS — M25551 Pain in right hip: Secondary | ICD-10-CM | POA: Diagnosis not present

## 2022-09-27 DIAGNOSIS — R5381 Other malaise: Secondary | ICD-10-CM | POA: Diagnosis not present

## 2022-09-27 DIAGNOSIS — R110 Nausea: Secondary | ICD-10-CM | POA: Diagnosis not present

## 2022-09-27 DIAGNOSIS — R031 Nonspecific low blood-pressure reading: Secondary | ICD-10-CM | POA: Diagnosis not present

## 2022-09-27 DIAGNOSIS — R42 Dizziness and giddiness: Secondary | ICD-10-CM | POA: Diagnosis not present

## 2022-09-28 ENCOUNTER — Encounter: Payer: Self-pay | Admitting: *Deleted

## 2022-09-28 ENCOUNTER — Ambulatory Visit: Payer: Self-pay | Admitting: *Deleted

## 2022-09-28 DIAGNOSIS — Z992 Dependence on renal dialysis: Secondary | ICD-10-CM | POA: Diagnosis not present

## 2022-09-28 DIAGNOSIS — R031 Nonspecific low blood-pressure reading: Secondary | ICD-10-CM | POA: Diagnosis not present

## 2022-09-28 DIAGNOSIS — S72141D Displaced intertrochanteric fracture of right femur, subsequent encounter for closed fracture with routine healing: Secondary | ICD-10-CM | POA: Diagnosis not present

## 2022-09-28 DIAGNOSIS — N186 End stage renal disease: Secondary | ICD-10-CM | POA: Diagnosis not present

## 2022-09-28 DIAGNOSIS — M25551 Pain in right hip: Secondary | ICD-10-CM | POA: Diagnosis not present

## 2022-09-28 DIAGNOSIS — R42 Dizziness and giddiness: Secondary | ICD-10-CM | POA: Diagnosis not present

## 2022-09-28 NOTE — Patient Instructions (Signed)
Visit Information  Thank you for taking time to visit with me today. Please don't hesitate to contact me if I can be of assistance to you.   Following are the goals we discussed today:   Goals Addressed               This Visit's Progress     Receive Assistance Applying for Personal Care Services. (pt-stated)   On track     Care Coordination Interventions:  Interventions Today    Flowsheet Row Most Recent Value  Chronic Disease   Chronic disease during today's visit Diabetes, Hypertension (HTN), Congestive Heart Failure (CHF), Chronic Kidney Disease/End Stage Renal Disease (ESRD), Other  [Major Depression, Single Episde, Severe]  General Interventions   General Interventions Discussed/Reviewed General Interventions Discussed, General Interventions Reviewed, Annual Eye Exam, Labs, Annual Foot Exam, Lipid Profile, Durable Medical Equipment (DME), Vaccines, Health Screening, [facility], Doctor Visits, Communication with, Level of Care  [facility] Care Provider & Representative with [facility]]  Labs Hgb A1c every 3 months, Kidney Function  Vaccines COVID-19, Flu, Pneumonia, RSV, Shingles  [Encouraged]  Doctor Visits Discussed/Reviewed Doctor Visits Discussed, Doctor Visits Reviewed, Annual Wellness Visits, PCP, Specialist  Health Screening Colonoscopy, Prostate  Durable Medical Equipment (DME) BP Cuff, Glucomoter, Insulin Pump, Oxygen, Walker, Psychologist, forensic  PCP/Specialist Visits Compliance with follow-up visit  Communication with PCP/Specialists  Level of Care Applications, Assisted Living, Skilled Nursing Facility, Teaching laboratory technician Personal Care Services  Exercise Interventions   Exercise Discussed/Reviewed Exercise Discussed, Exercise Reviewed, Physical Activity, Weight Managment, Assistive device use and maintanence  Physical Activity Discussed/Reviewed Physical Activity Discussed, Physical Activity Reviewed, Types of exercise   [Encouraged]  Weight Management Weight maintenance  Education Interventions   Education Provided Provided Therapist, sports, Provided Education  Provided Verbal Education On Nutrition, Foot Care, Eye Care, Labs, Blood Sugar Monitoring, Mental Health/Coping with Illness, Applications, Exercise, Medication, When to see the doctor, [facility], Secondary school teacher  Mental Health Interventions   Mental Health Discussed/Reviewed Mental Health Discussed, Mental Health Reviewed, Coping Strategies, Crisis, Anxiety, Depression, Suicide  Nutrition Interventions   Nutrition Discussed/Reviewed Nutrition Discussed, Nutrition Reviewed, Adding fruits and vegetables, Fluid intake, Portion sizes, Decreasing sugar intake, Decreasing salt, Decreasing fats, Increaing proteins  Pharmacy Interventions   Pharmacy Dicussed/Reviewed Pharmacy Topics Discussed, Pharmacy Topics Reviewed, Medication Adherence, Affording Medications  Safety Interventions   Safety Discussed/Reviewed Safety Discussed, Safety Reviewed, Fall Risk, Home Safety  Home Engineer, water, Refer for community resources, Contact home health agency  Advanced Directive Interventions   Advanced Directives Discussed/Reviewed Advanced Directives Discussed, Advanced Directives Reviewed     Active listening & reflection utilized.  Verbalization of feelings encouraged. Emotional support provided. Feelings of frustration regarding loss of independence validated. Caregiver stress & burnout acknowledged. Caregiver resources reviewed. Caregiver support groups provided. Self-enrollment in caregiver support group of interest emphasized. Problem solving interventions indicated. Task-centered strategies employed. Solution-focused activities activated. Cognitive Behavioral Therapy initiated. Acceptance & Commitment Therapy implemented. Client-Centered Therapy performed. CSW collaboration with caregiver,  [name] to confirm ongoing residence at [facility] (# [phone]), to receive short-term rehabilitative services. CSW collaboration with caregiver, [name] to confirm desire to return home to live independently, upon completion of short-term rehabilitative services at [facility] (# [phone]). CSW collaboration with caregiver, [name] to encourage direct contact with [facility] [phone]), to check status of Personal Care Services application, admitting that application process may need to be reinitiated.  CSW collaboration with caregiver, [name] to encourage direct contact with CSW (# [phone]), if she has questions, needs assistance, or if additional social work needs are identified between now & our next scheduled follow-up outreach call.      Our next appointment is by telephone on 10/12/2022 at 10:45 am.   Please call the care guide team at [phone] if you need to cancel or reschedule your appointment.   If you are experiencing a Mental Health or Behavioral Health Crisis or need someone to talk to, please call the Suicide and Crisis Lifeline: 988 call the [place] National Suicide Prevention Lifeline: 747-545-2930 or TTY: 605-731-4493 TTY (253)125-0455) to talk to a trained counselor call 1-800-273-TALK (toll free, 24 hour hotline) go to [facility] [address], [place] [phone]) call the [facility] Crisis Line: (401)133-7575 call 911  Patient verbalizes understanding of instructions and care plan provided today and agrees to view in MyChart. Active MyChart status and patient understanding of how to access instructions and care plan via MyChart confirmed with patient.     Telephone follow up appointment with care management team member scheduled for: 10/12/2022 at 10:45 am.   Danford Bad, BSW, MSW, LCSW  Licensed Clinical Social Worker  [facility]  Mailing [place]. [address] [place], [place] [address] Physical Address[address] [place], [place] [address] Toll Free Main # [phone] Fax # [phone] Cell # [phone] [email or web address]

## 2022-09-28 NOTE — Patient Outreach (Signed)
Care Coordination   Follow Up Visit Note   09/28/2022  Name: [name] MRN: [id number] DOB: Nov 01, 1955  [name] is a 67 y.o. year old male who sees [name], Jonna Coup, MD for primary care. I spoke with patient's 24 hour caregiver & significant other, [name] by phone today.  What matters to the patients health and wellness today?  Receive Assistance Applying for Personal Care Services.   Goals Addressed               This Visit's Progress     Receive Assistance Applying for Personal Care Services. (pt-stated)   On track     Care Coordination Interventions:  Interventions Today    Flowsheet Row Most Recent Value  Chronic Disease   Chronic disease during today's visit Diabetes, Hypertension (HTN), Congestive Heart Failure (CHF), Chronic Kidney Disease/End Stage Renal Disease (ESRD), Other  [Major Depression, Single Episde, Severe]  General Interventions   General Interventions Discussed/Reviewed General Interventions Discussed, General Interventions Reviewed, Annual Eye Exam, Labs, Annual Foot Exam, Lipid Profile, Durable Medical Equipment (DME), Vaccines, Health Screening, [facility], Doctor Visits, Communication with, Level of Care  [facility] Care Provider & Representative with [facility]]  Labs Hgb A1c every 3 months, Kidney Function  Vaccines COVID-19, Flu, Pneumonia, RSV, Shingles  [Encouraged]  Doctor Visits Discussed/Reviewed Doctor Visits Discussed, Doctor Visits Reviewed, Annual Wellness Visits, PCP, Specialist  Health Screening Colonoscopy, Prostate  Durable Medical Equipment (DME) BP Cuff, Glucomoter, Insulin Pump, Oxygen, Walker, Psychologist, forensic  PCP/Specialist Visits Compliance with follow-up visit  Communication with PCP/Specialists  Level of Care Applications, Assisted Living, Skilled Nursing Facility, Teaching laboratory technician Personal Care Services  Exercise Interventions   Exercise  Discussed/Reviewed Exercise Discussed, Exercise Reviewed, Physical Activity, Weight Managment, Assistive device use and maintanence  Physical Activity Discussed/Reviewed Physical Activity Discussed, Physical Activity Reviewed, Types of exercise  [Encouraged]  Weight Management Weight maintenance  Education Interventions   Education Provided Provided Therapist, sports, Provided Education  Provided Verbal Education On Nutrition, Foot Care, Eye Care, Labs, Blood Sugar Monitoring, Mental Health/Coping with Illness, Applications, Exercise, Medication, When to see the doctor, [facility], Secondary school teacher  Mental Health Interventions   Mental Health Discussed/Reviewed Mental Health Discussed, Mental Health Reviewed, Coping Strategies, Crisis, Anxiety, Depression, Suicide  Nutrition Interventions   Nutrition Discussed/Reviewed Nutrition Discussed, Nutrition Reviewed, Adding fruits and vegetables, Fluid intake, Portion sizes, Decreasing sugar intake, Decreasing salt, Decreasing fats, Increaing proteins  Pharmacy Interventions   Pharmacy Dicussed/Reviewed Pharmacy Topics Discussed, Pharmacy Topics Reviewed, Medication Adherence, Affording Medications  Safety Interventions   Safety Discussed/Reviewed Safety Discussed, Safety Reviewed, Fall Risk, Home Safety  Home Engineer, water, Refer for community resources, Contact home health agency  Advanced Directive Interventions   Advanced Directives Discussed/Reviewed Advanced Directives Discussed, Advanced Directives Reviewed     Active listening & reflection utilized.  Verbalization of feelings encouraged. Emotional support provided. Feelings of frustration regarding loss of independence validated. Caregiver stress & burnout acknowledged. Caregiver resources reviewed. Caregiver support groups provided. Self-enrollment in caregiver support group of interest emphasized. Problem solving interventions  indicated. Task-centered strategies employed. Solution-focused activities activated. Cognitive Behavioral Therapy initiated. Acceptance & Commitment Therapy implemented. Client-Centered Therapy performed. CSW collaboration with caregiver, [name] to confirm ongoing residence at [facility] (# [phone]), to receive short-term rehabilitative services. CSW collaboration with caregiver, [name] to confirm desire to return home to live independently, upon completion of short-term rehabilitative services at [facility] (# [phone]). CSW collaboration with caregiver, [name] to encourage direct contact with [facility] [phone]), to check status of Personal Care Services application, admitting that application process may need to be reinitiated. CSW collaboration with caregiver, [name] to encourage direct contact with CSW (# [phone]), if she has questions, needs assistance, or if additional social work needs are identified between now & our next scheduled follow-up outreach call.      [name] assessments and interventions completed:  Yes.  Care Coordination Interventions:  Yes, provided.   Follow up plan: Follow up call scheduled for 10/12/2022 at 10:45 am.   Encounter Outcome:  Pt. Visit Completed.   [name], BSW, MSW, LCSW  Licensed Restaurant manager, [employer] [facility]  Mailing [place] [address] [place], [place] [address] Physical Address-[address] [place], [place] [address] Toll Free Main # [phone] Fax # [phone] Cell # [phone] [name]

## 2022-09-29 ENCOUNTER — Ambulatory Visit (HOSPITAL_BASED_OUTPATIENT_CLINIC_OR_DEPARTMENT_OTHER): Payer: Medicare HMO | Admitting: Family

## 2022-09-29 DIAGNOSIS — S72141D Displaced intertrochanteric fracture of right femur, subsequent encounter for closed fracture with routine healing: Secondary | ICD-10-CM | POA: Diagnosis not present

## 2022-09-29 DIAGNOSIS — R031 Nonspecific low blood-pressure reading: Secondary | ICD-10-CM | POA: Diagnosis not present

## 2022-09-29 DIAGNOSIS — M25551 Pain in right hip: Secondary | ICD-10-CM | POA: Diagnosis not present

## 2022-09-29 DIAGNOSIS — R110 Nausea: Secondary | ICD-10-CM | POA: Diagnosis not present

## 2022-09-29 DIAGNOSIS — R42 Dizziness and giddiness: Secondary | ICD-10-CM | POA: Diagnosis not present

## 2022-09-30 DIAGNOSIS — Z992 Dependence on renal dialysis: Secondary | ICD-10-CM | POA: Diagnosis not present

## 2022-09-30 DIAGNOSIS — N186 End stage renal disease: Secondary | ICD-10-CM | POA: Diagnosis not present

## 2022-09-30 DIAGNOSIS — R031 Nonspecific low blood-pressure reading: Secondary | ICD-10-CM | POA: Diagnosis not present

## 2022-09-30 DIAGNOSIS — R42 Dizziness and giddiness: Secondary | ICD-10-CM | POA: Diagnosis not present

## 2022-09-30 DIAGNOSIS — R110 Nausea: Secondary | ICD-10-CM | POA: Diagnosis not present

## 2022-09-30 DIAGNOSIS — M25551 Pain in right hip: Secondary | ICD-10-CM | POA: Diagnosis not present

## 2022-09-30 DIAGNOSIS — S72141D Displaced intertrochanteric fracture of right femur, subsequent encounter for closed fracture with routine healing: Secondary | ICD-10-CM | POA: Diagnosis not present

## 2022-10-01 DIAGNOSIS — R110 Nausea: Secondary | ICD-10-CM | POA: Diagnosis not present

## 2022-10-01 DIAGNOSIS — R031 Nonspecific low blood-pressure reading: Secondary | ICD-10-CM | POA: Diagnosis not present

## 2022-10-01 DIAGNOSIS — K5900 Constipation, unspecified: Secondary | ICD-10-CM | POA: Diagnosis not present

## 2022-10-01 DIAGNOSIS — I10 Essential (primary) hypertension: Secondary | ICD-10-CM | POA: Diagnosis not present

## 2022-10-01 DIAGNOSIS — N186 End stage renal disease: Secondary | ICD-10-CM | POA: Diagnosis not present

## 2022-10-01 DIAGNOSIS — R4182 Altered mental status, unspecified: Secondary | ICD-10-CM | POA: Diagnosis not present

## 2022-10-01 DIAGNOSIS — S72141D Displaced intertrochanteric fracture of right femur, subsequent encounter for closed fracture with routine healing: Secondary | ICD-10-CM | POA: Diagnosis not present

## 2022-10-01 DIAGNOSIS — M25551 Pain in right hip: Secondary | ICD-10-CM | POA: Diagnosis not present

## 2022-10-01 DIAGNOSIS — R42 Dizziness and giddiness: Secondary | ICD-10-CM | POA: Diagnosis not present

## 2022-10-03 DIAGNOSIS — N186 End stage renal disease: Secondary | ICD-10-CM | POA: Diagnosis not present

## 2022-10-03 DIAGNOSIS — Z992 Dependence on renal dialysis: Secondary | ICD-10-CM | POA: Diagnosis not present

## 2022-10-04 DIAGNOSIS — I959 Hypotension, unspecified: Secondary | ICD-10-CM | POA: Diagnosis not present

## 2022-10-04 DIAGNOSIS — M25551 Pain in right hip: Secondary | ICD-10-CM | POA: Diagnosis not present

## 2022-10-04 DIAGNOSIS — R0989 Other specified symptoms and signs involving the circulatory and respiratory systems: Secondary | ICD-10-CM | POA: Diagnosis not present

## 2022-10-04 DIAGNOSIS — R5381 Other malaise: Secondary | ICD-10-CM | POA: Diagnosis not present

## 2022-10-04 DIAGNOSIS — R051 Acute cough: Secondary | ICD-10-CM | POA: Diagnosis not present

## 2022-10-05 DIAGNOSIS — J811 Chronic pulmonary edema: Secondary | ICD-10-CM | POA: Diagnosis not present

## 2022-10-05 DIAGNOSIS — M25551 Pain in right hip: Secondary | ICD-10-CM | POA: Diagnosis not present

## 2022-10-05 DIAGNOSIS — B370 Candidal stomatitis: Secondary | ICD-10-CM | POA: Diagnosis not present

## 2022-10-05 DIAGNOSIS — N186 End stage renal disease: Secondary | ICD-10-CM | POA: Diagnosis not present

## 2022-10-05 DIAGNOSIS — Z992 Dependence on renal dialysis: Secondary | ICD-10-CM | POA: Diagnosis not present

## 2022-10-05 DIAGNOSIS — I959 Hypotension, unspecified: Secondary | ICD-10-CM | POA: Diagnosis not present

## 2022-10-06 DIAGNOSIS — R110 Nausea: Secondary | ICD-10-CM | POA: Diagnosis not present

## 2022-10-06 DIAGNOSIS — F32A Depression, unspecified: Secondary | ICD-10-CM | POA: Diagnosis not present

## 2022-10-06 DIAGNOSIS — E876 Hypokalemia: Secondary | ICD-10-CM | POA: Diagnosis not present

## 2022-10-06 DIAGNOSIS — M25551 Pain in right hip: Secondary | ICD-10-CM | POA: Diagnosis not present

## 2022-10-06 DIAGNOSIS — J90 Pleural effusion, not elsewhere classified: Secondary | ICD-10-CM | POA: Diagnosis not present

## 2022-10-06 DIAGNOSIS — I10 Essential (primary) hypertension: Secondary | ICD-10-CM | POA: Diagnosis not present

## 2022-10-06 DIAGNOSIS — D649 Anemia, unspecified: Secondary | ICD-10-CM | POA: Diagnosis not present

## 2022-10-07 DIAGNOSIS — N186 End stage renal disease: Secondary | ICD-10-CM | POA: Diagnosis not present

## 2022-10-07 DIAGNOSIS — M25551 Pain in right hip: Secondary | ICD-10-CM | POA: Diagnosis not present

## 2022-10-07 DIAGNOSIS — R5381 Other malaise: Secondary | ICD-10-CM | POA: Diagnosis not present

## 2022-10-07 DIAGNOSIS — R110 Nausea: Secondary | ICD-10-CM | POA: Diagnosis not present

## 2022-10-07 DIAGNOSIS — Z992 Dependence on renal dialysis: Secondary | ICD-10-CM | POA: Diagnosis not present

## 2022-10-07 DIAGNOSIS — R0602 Shortness of breath: Secondary | ICD-10-CM | POA: Diagnosis not present

## 2022-10-10 DIAGNOSIS — Z992 Dependence on renal dialysis: Secondary | ICD-10-CM | POA: Diagnosis not present

## 2022-10-10 DIAGNOSIS — N186 End stage renal disease: Secondary | ICD-10-CM | POA: Diagnosis not present

## 2022-10-11 DIAGNOSIS — Z992 Dependence on renal dialysis: Secondary | ICD-10-CM | POA: Diagnosis not present

## 2022-10-11 DIAGNOSIS — E1122 Type 2 diabetes mellitus with diabetic chronic kidney disease: Secondary | ICD-10-CM | POA: Diagnosis not present

## 2022-10-11 DIAGNOSIS — N186 End stage renal disease: Secondary | ICD-10-CM | POA: Diagnosis not present

## 2022-10-11 DIAGNOSIS — R110 Nausea: Secondary | ICD-10-CM | POA: Diagnosis not present

## 2022-10-11 DIAGNOSIS — M25551 Pain in right hip: Secondary | ICD-10-CM | POA: Diagnosis not present

## 2022-10-11 DIAGNOSIS — R531 Weakness: Secondary | ICD-10-CM | POA: Diagnosis not present

## 2022-10-12 ENCOUNTER — Encounter: Payer: Self-pay | Admitting: *Deleted

## 2022-10-12 ENCOUNTER — Ambulatory Visit: Payer: Self-pay | Admitting: *Deleted

## 2022-10-12 NOTE — Patient Instructions (Signed)
Visit Information  Thank you for taking time to visit with me today. Please don't hesitate to contact me if I can be of assistance to you.   Following are the goals we discussed today:   Goals Addressed               This Visit's Progress     Receive Assistance Applying for Personal Care Services. (pt-stated)   On track     Care Coordination Interventions:  Interventions Today    Flowsheet Row Most Recent Value  Chronic Disease   Chronic disease during today's visit Diabetes, Hypertension (HTN), Congestive Heart Failure (CHF), Chronic Kidney Disease/End Stage Renal Disease (ESRD), Other  [Major Depression, Single Episde, Severe]  General Interventions   General Interventions Discussed/Reviewed General Interventions Discussed, General Interventions Reviewed, Annual Eye Exam, Labs, Annual Foot Exam, Lipid Profile, Durable Medical Equipment (DME), Vaccines, Health Screening, [facility], Doctor Visits, Communication with, Level of Care  [facility] Care Provider & Representative with [facility]]  Labs Hgb A1c every 3 months, Kidney Function  Vaccines COVID-19, Flu, Pneumonia, RSV, Shingles  [Encouraged]  Doctor Visits Discussed/Reviewed Doctor Visits Discussed, Doctor Visits Reviewed, Annual Wellness Visits, PCP, Specialist  Health Screening Colonoscopy, Prostate  Durable Medical Equipment (DME) BP Cuff, Glucomoter, Insulin Pump, Oxygen, Walker, Psychologist, forensic  PCP/Specialist Visits Compliance with follow-up visit  Communication with PCP/Specialists  Level of Care Applications, Assisted Living, Skilled Nursing Facility, Teaching laboratory technician Personal Care Services  Exercise Interventions   Exercise Discussed/Reviewed Exercise Discussed, Exercise Reviewed, Physical Activity, Weight Managment, Assistive device use and maintanence  Physical Activity Discussed/Reviewed Physical Activity Discussed, Physical Activity Reviewed, Types of exercise   [Encouraged]  Weight Management Weight maintenance  Education Interventions   Education Provided Provided Therapist, sports, Provided Education  Provided Verbal Education On Nutrition, Foot Care, Eye Care, Labs, Blood Sugar Monitoring, Mental Health/Coping with Illness, Applications, Exercise, Medication, When to see the doctor, [facility], Secondary school teacher  Mental Health Interventions   Mental Health Discussed/Reviewed Mental Health Discussed, Mental Health Reviewed, Coping Strategies, Crisis, Anxiety, Depression, Suicide  Nutrition Interventions   Nutrition Discussed/Reviewed Nutrition Discussed, Nutrition Reviewed, Adding fruits and vegetables, Fluid intake, Portion sizes, Decreasing sugar intake, Decreasing salt, Decreasing fats, Increaing proteins  Pharmacy Interventions   Pharmacy Dicussed/Reviewed Pharmacy Topics Discussed, Pharmacy Topics Reviewed, Medication Adherence, Affording Medications  Safety Interventions   Safety Discussed/Reviewed Safety Discussed, Safety Reviewed, Fall Risk, Home Safety  Home Engineer, water, Refer for community resources, Contact home health agency  Advanced Directive Interventions   Advanced Directives Discussed/Reviewed Advanced Directives Discussed, Advanced Directives Reviewed     Active listening & reflection utilized.  Verbalization of feelings encouraged. Emotional support provided. Feelings of frustration regarding loss of independence validated. Caregiver stress & burnout acknowledged. Caregiver resources reviewed. Caregiver support groups provided. Self-enrollment in caregiver support group of interest emphasized. Problem solving interventions indicated. Task-centered strategies employed. Solution-focused activities activated. Cognitive Behavioral Therapy initiated. Acceptance & Commitment Therapy implemented. Client-Centered Therapy performed. CSW collaboration with caregiver,  [name] to confirm your continued residency at [facility] (# [phone]), to receive short-term rehabilitative services. CSW collaboration with caregiver, [name] to confirm desire to return home to live independently, upon completion of short-term rehabilitative services at [facility] (# [phone]). CSW collaboration with caregiver, [name] to confirm initial home assessment with representative from [facility] [phone]), scheduled on 11/01/2022 at 1:00 pm, for patient to be evaluated for Personal  Care Services in the home. CSW collaboration with caregiver, [name] to encourage direct contact with [facility] [phone]), to reschedule initial home assessment for Personal Care Services, if patient is still residing at [facility] (# [phone]) on 11/01/2022. CSW collaboration with caregiver, [name] to encourage direct contact with CSW (# [phone]), if she has questions, needs assistance, or if additional social work needs are identified between now & our next scheduled follow-up outreach call.      Our next appointment is by telephone on 11/02/2022 at 9:00 am.  Please call the care guide team at [phone] if you need to cancel or reschedule your appointment.   If you are experiencing a Mental Health or Behavioral Health Crisis or need someone to talk to, please call the Suicide and Crisis Lifeline: 988 call the [place] National Suicide Prevention Lifeline: 617-161-3028 or TTY: (657)480-0569 TTY 315-836-3881) to talk to a trained counselor call 1-800-273-TALK (toll free, 24 hour hotline) go to [facility] [address] [place] [phone]) call the [facility] Crisis Line: [phone] call 911  Patient verbalizes understanding of instructions and care plan provided today and agrees to view in MyChart. Active MyChart status and  patient understanding of how to access instructions and care plan via MyChart confirmed with patient.     Telephone follow up appointment with care management team member scheduled for:  11/02/2022 at 9:00 am.  Danford Bad, BSW, MSW, LCSW  Licensed Clinical Social Worker  [facility]  Mailing [place]. [address], [place], [place] [address] Physical Address[address], [place], [place] [address] Toll Free Main # [phone] Fax # [phone] Cell # [phone] [email or web address]

## 2022-10-12 NOTE — Patient Outreach (Signed)
Care Coordination   Follow Up Visit Note   10/12/2022  Name: [name] MRN: [id number] DOB: [birth year]  [name] is a 67 y.o. year old male who sees [name], Jonna Coup, MD for primary care. I spoke with Buena Irish and caregiver, [name] by phone today.  What matters to the patients health and wellness today?  Receive Assistance Applying for Personal Care Services.   Goals Addressed               This Visit's Progress     Receive Assistance Applying for Personal Care Services. (pt-stated)   On track     Care Coordination Interventions:  Interventions Today    Flowsheet Row Most Recent Value  Chronic Disease   Chronic disease during today's visit Diabetes, Hypertension (HTN), Congestive Heart Failure (CHF), Chronic Kidney Disease/End Stage Renal Disease (ESRD), Other  [Major Depression, Single Episde, Severe]  General Interventions   General Interventions Discussed/Reviewed General Interventions Discussed, General Interventions Reviewed, Annual Eye Exam, Labs, Annual Foot Exam, Lipid Profile, Durable Medical Equipment (DME), Vaccines, Health Screening, [facility], Doctor Visits, Communication with, Level of Care  [facility] Care Provider & Representative with [facility]]  Labs Hgb A1c every 3 months, Kidney Function  Vaccines COVID-19, Flu, Pneumonia, RSV, Shingles  [Encouraged]  Doctor Visits Discussed/Reviewed Doctor Visits Discussed, Doctor Visits Reviewed, Annual Wellness Visits, PCP, Specialist  Health Screening Colonoscopy, Prostate  Durable Medical Equipment (DME) BP Cuff, Glucomoter, Insulin Pump, Oxygen, Walker, Psychologist, forensic  PCP/Specialist Visits Compliance with follow-up visit  Communication with PCP/Specialists  Level of Care Applications, Assisted Living, Skilled Nursing Facility, Teaching laboratory technician Personal Care Services  Exercise Interventions   Exercise Discussed/Reviewed Exercise  Discussed, Exercise Reviewed, Physical Activity, Weight Managment, Assistive device use and maintanence  Physical Activity Discussed/Reviewed Physical Activity Discussed, Physical Activity Reviewed, Types of exercise  [Encouraged]  Weight Management Weight maintenance  Education Interventions   Education Provided Provided Therapist, sports, Provided Education  Provided Verbal Education On Nutrition, Foot Care, Eye Care, Labs, Blood Sugar Monitoring, Mental Health/Coping with Illness, Applications, Exercise, Medication, When to see the doctor, [facility], Secondary school teacher  Mental Health Interventions   Mental Health Discussed/Reviewed Mental Health Discussed, Mental Health Reviewed, Coping Strategies, Crisis, Anxiety, Depression, Suicide  Nutrition Interventions   Nutrition Discussed/Reviewed Nutrition Discussed, Nutrition Reviewed, Adding fruits and vegetables, Fluid intake, Portion sizes, Decreasing sugar intake, Decreasing salt, Decreasing fats, Increaing proteins  Pharmacy Interventions   Pharmacy Dicussed/Reviewed Pharmacy Topics Discussed, Pharmacy Topics Reviewed, Medication Adherence, Affording Medications  Safety Interventions   Safety Discussed/Reviewed Safety Discussed, Safety Reviewed, Fall Risk, Home Safety  Home Engineer, water, Refer for community resources, Contact home health agency  Advanced Directive Interventions   Advanced Directives Discussed/Reviewed Advanced Directives Discussed, Advanced Directives Reviewed     Active listening & reflection utilized.  Verbalization of feelings encouraged. Emotional support provided. Feelings of frustration regarding loss of independence validated. Caregiver stress & burnout acknowledged. Caregiver resources reviewed. Caregiver support groups provided. Self-enrollment in caregiver support group of interest emphasized. Problem solving interventions indicated. Task-centered  strategies employed. Solution-focused activities activated. Cognitive Behavioral Therapy initiated. Acceptance & Commitment Therapy implemented. Client-Centered Therapy performed. CSW collaboration with caregiver, [name] to confirm your continued residency at [facility] (# [phone]), to receive short-term rehabilitative services. CSW collaboration with caregiver, [name] to confirm desire to return home to live independently, upon completion of short-term rehabilitative services at [facility] (# [phone]). CSW collaboration with caregiver, [name] to confirm initial home assessment with representative from [facility] [phone]), scheduled on 11/01/2022 at 1:00 pm, for patient to be evaluated for Personal Care Services in the home. CSW collaboration with caregiver, [name] to encourage direct contact with [facility] [phone]), to reschedule initial home assessment for Personal Care Services, if patient is still residing at [facility] (# [phone]) on 11/01/2022. CSW collaboration with caregiver, [name] to encourage direct contact with CSW (# [phone]), if she has questions, needs assistance, or if additional social work needs are identified between now & our next scheduled follow-up outreach call.      [name] assessments and interventions completed:  Yes.  Care Coordination Interventions:  Yes, provided.   Follow up plan: Follow up call scheduled for 11/02/2022 at 9:00 am.  Encounter Outcome:  Pt. Visit Completed.   Danford Bad, BSW, MSW, LCSW  Licensed Restaurant manager, [employer] food [facility]  Mailing [place] [address], [place], [place] [address] Physical Address-[address], [place], [place] [address] Toll Free Main # [phone] Fax # [phone] Cell # [phone] [email or web address]

## 2022-10-25 ENCOUNTER — Encounter: Payer: Medicare HMO | Admitting: *Deleted

## 2022-10-31 ENCOUNTER — Encounter: Payer: Self-pay | Admitting: *Deleted

## 2022-10-31 ENCOUNTER — Other Ambulatory Visit (HOSPITAL_COMMUNITY): Payer: Self-pay | Admitting: Internal Medicine

## 2022-10-31 ENCOUNTER — Ambulatory Visit: Payer: Self-pay | Admitting: *Deleted

## 2022-10-31 DIAGNOSIS — C159 Malignant neoplasm of esophagus, unspecified: Secondary | ICD-10-CM

## 2022-10-31 NOTE — Patient Outreach (Signed)
Care Coordination   Follow Up Visit Note   10/31/2022  Name: [name] MRN: [id number] DOB: [birth year]  [name] is a 67 y.o. year old male who sees [name], Jonna Coup, MD for primary care. I spoke with significant other, [name] by phone today.  What matters to the patients health and wellness today?  Receive Assistance Applying for Personal Care Services.    Goals Addressed               This Visit's Progress     Receive Assistance Applying for Personal Care Services. (pt-stated)   On track     Care Coordination Interventions:  Interventions Today    Flowsheet Row Most Recent Value  Chronic Disease   Chronic disease during today's visit Diabetes, Hypertension (HTN), Congestive Heart Failure (CHF), Chronic Kidney Disease/End Stage Renal Disease (ESRD), Other  [Major Depression, Single Episde, Severe]  General Interventions   General Interventions Discussed/Reviewed General Interventions Discussed, General Interventions Reviewed, Annual Eye Exam, Labs, Annual Foot Exam, Lipid Profile, Durable Medical Equipment (DME), Vaccines, Health Screening, [employer], Doctor Visits, Communication with, Level of Care  [facility] Care Provider & Representative with [facility]]  Labs Hgb A1c every 3 months, Kidney Function  Vaccines COVID-19, Flu, Pneumonia, RSV, Shingles  [Encouraged]  Doctor Visits Discussed/Reviewed Doctor Visits Discussed, Doctor Visits Reviewed, Annual Wellness Visits, PCP, Specialist  Health Screening Colonoscopy, Prostate  Durable Medical Equipment (DME) BP Cuff, Glucomoter, Insulin Pump, Oxygen, Walker, Psychologist, forensic  PCP/Specialist Visits Compliance with follow-up visit  Communication with PCP/Specialists  Level of Care Applications, Assisted Living, Skilled Nursing Facility, Teaching laboratory technician Personal Care Services  Exercise Interventions   Exercise Discussed/Reviewed Exercise Discussed,  Exercise Reviewed, Physical Activity, Weight Managment, Assistive device use and maintanence  Physical Activity Discussed/Reviewed Physical Activity Discussed, Physical Activity Reviewed, Types of exercise  [Encouraged]  Weight Management Weight maintenance  Education Interventions   Education Provided Provided Therapist, sports, Provided Education  Provided Verbal Education On Nutrition, Foot Care, Eye Care, Labs, Blood Sugar Monitoring, Mental Health/Coping with Illness, Applications, Exercise, Medication, When to see the doctor, [facility], Secondary school teacher  Mental Health Interventions   Mental Health Discussed/Reviewed Mental Health Discussed, Mental Health Reviewed, Coping Strategies, Crisis, Anxiety, Depression, Suicide  Nutrition Interventions   Nutrition Discussed/Reviewed Nutrition Discussed, Nutrition Reviewed, Adding fruits and vegetables, Fluid intake, Portion sizes, Decreasing sugar intake, Decreasing salt, Decreasing fats, Increaing proteins  Pharmacy Interventions   Pharmacy Dicussed/Reviewed Pharmacy Topics Discussed, Pharmacy Topics Reviewed, Medication Adherence, Affording Medications  Safety Interventions   Safety Discussed/Reviewed Safety Discussed, Safety Reviewed, Fall Risk, Home Safety  Home Engineer, water, Refer for community resources, Contact home health agency  Advanced Directive Interventions   Advanced Directives Discussed/Reviewed Advanced Directives Discussed, Advanced Directives Reviewed     Active listening & reflection utilized.  Verbalization of feelings encouraged. Emotional support provided. Feelings of frustration regarding loss of independence validated. Caregiver stress & burnout acknowledged. Caregiver resources reviewed. Caregiver support groups provided. Self-enrollment in caregiver support group of interest emphasized. Problem solving interventions indicated. Task-centered strategies  employed. Solution-focused activities activated. Cognitive Behavioral Therapy initiated. Acceptance & Commitment Therapy implemented. Client-Centered Therapy performed. CSW collaboration with caregiver, [name] to confirm your continued residency at [facility] (# [phone]), to receive short-term rehabilitative services. CSW collaboration with caregiver, [name] to confirm desire to return home to live independently, upon completion of short-term rehabilitative services at [facility] (#  [phone]). CSW collaboration with caregiver, [name] to confirm initial home assessment with representative from [facility] [phone]), scheduled on 11/01/2022 at 1:00 pm, for patient to be evaluated for Personal Care Services in the home. CSW collaboration with caregiver, [name] to encourage direct contact with [facility] [phone]), to reschedule initial home assessment for Personal Care Services, if patient is still residing at [facility] (# [phone]) on 11/01/2022 at 1:00 pm. CSW collaboration with caregiver, [name] to confirm inability to make contact with representative from [facility] [phone]), to reschedule initial home assessment on 11/01/2022 at 1:00 pm, to determine eligibility for Personal Care Services, due to patient still residing at [facility] (# [phone]). CSW collaboration with representative from [facility] [phone]), to cancel initial home assessment on 11/01/2022 at 1:00 pm, to determine eligibility for Personal Care Services, due to patient still residing at [facility] (# [phone]). CSW collaboration with caregiver, [name] to confirm initial home assessment with [facility] [phone]), scheduled on 11/01/2022 at 1:00 pm, to determine eligibility for [facility], has been  cancelled. CSW collaboration with caregiver, [name] to encourage acceptance of all calls from [facility] [phone]), in an effort to reschedule initial home assessment, to determine eligibility for Personal Care Services. CSW collaboration with caregiver, [name] to agree to place a follow-up outreach call on 11/02/2022 at 9:00 am, to ensure communication with [facility] (# [phone]), to reschedule initial home assessment, to determine eligibility for Personal Care Services. CSW collaboration with caregiver, [name] to encourage direct contact with CSW (# [phone]), if she has questions, needs assistance, or if additional social work needs are identified between now & our next scheduled follow-up outreach call.      [name] assessments and interventions completed:  Yes.  Care Coordination Interventions:  Yes, provided.   Follow up plan: Follow up call scheduled for 11/02/2022 at 9:00 am.  Encounter Outcome:  Pt. Visit Completed.   Danford Bad, BSW, MSW, LCSW  Licensed Restaurant manager, [employer] [facility]  Mailing [place] [address] [place], [place] [address] Physical Address-[address] [place], [place] [address] Toll Free Main # [phone] Fax # [phone] Cell # [phone] [email or web address]

## 2022-10-31 NOTE — Patient Instructions (Signed)
Visit Information  Thank you for taking time to visit with me today. Please don't hesitate to contact me if I can be of assistance to you.   Following are the goals we discussed today:   Goals Addressed               This Visit's Progress     Receive Assistance Applying for Personal Care Services. (pt-stated)   On track     Care Coordination Interventions:  Interventions Today    Flowsheet Row Most Recent Value  Chronic Disease   Chronic disease during today's visit Diabetes, Hypertension (HTN), Congestive Heart Failure (CHF), Chronic Kidney Disease/End Stage Renal Disease (ESRD), Other  [Major Depression, Single Episde, Severe]  General Interventions   General Interventions Discussed/Reviewed General Interventions Discussed, General Interventions Reviewed, Annual Eye Exam, Labs, Annual Foot Exam, Lipid Profile, Durable Medical Equipment (DME), Vaccines, Health Screening, [facility], Doctor Visits, Communication with, Level of Care  [facility] Care Provider & Representative with [facility]]  Labs Hgb A1c every 3 months, Kidney Function  Vaccines COVID-19, Flu, Pneumonia, RSV, Shingles  [Encouraged]  Doctor Visits Discussed/Reviewed Doctor Visits Discussed, Doctor Visits Reviewed, Annual Wellness Visits, PCP, Specialist  Health Screening Colonoscopy, Prostate  Durable Medical Equipment (DME) BP Cuff, Glucomoter, Insulin Pump, Oxygen, Walker, Psychologist, forensic  PCP/Specialist Visits Compliance with follow-up visit  Communication with PCP/Specialists  Level of Care Applications, Assisted Living, Skilled Nursing Facility, Teaching laboratory technician Personal Care Services  Exercise Interventions   Exercise Discussed/Reviewed Exercise Discussed, Exercise Reviewed, Physical Activity, Weight Managment, Assistive device use and maintanence  Physical Activity Discussed/Reviewed Physical Activity Discussed, Physical Activity Reviewed, Types of exercise   [Encouraged]  Weight Management Weight maintenance  Education Interventions   Education Provided Provided Therapist, sports, Provided Education  Provided Verbal Education On Nutrition, Foot Care, Eye Care, Labs, Blood Sugar Monitoring, Mental Health/Coping with Illness, Applications, Exercise, Medication, When to see the doctor, [facility], Secondary school teacher  Mental Health Interventions   Mental Health Discussed/Reviewed Mental Health Discussed, Mental Health Reviewed, Coping Strategies, Crisis, Anxiety, Depression, Suicide  Nutrition Interventions   Nutrition Discussed/Reviewed Nutrition Discussed, Nutrition Reviewed, Adding fruits and vegetables, Fluid intake, Portion sizes, Decreasing sugar intake, Decreasing salt, Decreasing fats, Increaing proteins  Pharmacy Interventions   Pharmacy Dicussed/Reviewed Pharmacy Topics Discussed, Pharmacy Topics Reviewed, Medication Adherence, Affording Medications  Safety Interventions   Safety Discussed/Reviewed Safety Discussed, Safety Reviewed, Fall Risk, Home Safety  Home Engineer, water, Refer for community resources, Contact home health agency  Advanced Directive Interventions   Advanced Directives Discussed/Reviewed Advanced Directives Discussed, Advanced Directives Reviewed     Active listening & reflection utilized.  Verbalization of feelings encouraged. Emotional support provided. Feelings of frustration regarding loss of independence validated. Caregiver stress & burnout acknowledged. Caregiver resources reviewed. Caregiver support groups provided. Self-enrollment in caregiver support group of interest emphasized. Problem solving interventions indicated. Task-centered strategies employed. Solution-focused activities activated. Cognitive Behavioral Therapy initiated. Acceptance & Commitment Therapy implemented. Client-Centered Therapy performed. CSW collaboration with caregiver,  [name] to confirm your continued residency at [facility] (# [phone]), to receive short-term rehabilitative services. CSW collaboration with caregiver, [name] to confirm desire to return home to live independently, upon completion of short-term rehabilitative services at [facility] (# [phone]). CSW collaboration with caregiver, [name] to confirm initial home assessment with representative from [facility] [phone]), scheduled on 11/01/2022 at 1:00 pm, for patient to be evaluated for Personal  Care Services in the home. CSW collaboration with caregiver, [name] to encourage direct contact with [facility] [phone]), to reschedule initial home assessment for Personal Care Services, if patient is still residing at [facility] (# [phone]) on 11/01/2022 at 1:00 pm. CSW collaboration with caregiver, [name] to confirm inability to make contact with representative from [facility] [phone]), to reschedule initial home assessment on 11/01/2022 at 1:00 pm, to determine eligibility for Personal Care Services, due to patient still residing at [facility] (# [phone]). CSW collaboration with representative from [facility] [phone]), to cancel initial home assessment on 11/01/2022 at 1:00 pm, to determine eligibility for Personal Care Services, due to patient still residing at [facility] (# [phone]). CSW collaboration with caregiver, [name] to confirm initial home assessment with [facility] [phone]), scheduled on 11/01/2022 at 1:00 pm, to determine eligibility for [facility], has been cancelled. CSW collaboration with caregiver, [name] to encourage acceptance of all calls from [facility] [phone]), in an effort to reschedule initial home assessment, to determine  eligibility for Personal Care Services. CSW collaboration with caregiver, [name] to agree to place a follow-up outreach call on 11/02/2022 at 9:00 am, to ensure communication with [facility] (# [phone]), to reschedule initial home assessment, to determine eligibility for Personal Care Services. CSW collaboration with caregiver, [name] to encourage direct contact with CSW (# [phone]), if she has questions, needs assistance, or if additional social work needs are identified between now & our next scheduled follow-up outreach call.      Our next appointment is by telephone on 11/02/2022 at 9:00 am.  Please call the care guide team at [phone] if you need to cancel or reschedule your appointment.   If you are experiencing a Mental Health or Behavioral Health Crisis or need someone to talk to, please call the Suicide and Crisis Lifeline: 988 call the [place] National Suicide Prevention Lifeline: 339-564-9908 or TTY: 936-023-7819 TTY 808-063-1003) to talk to a trained counselor call 1-800-273-TALK (toll free, 24 hour hotline) go to [facility] [address] [place] [phone]) call the [facility] Crisis Line: 4083554449 call 911  Patient verbalizes understanding of instructions and care plan provided today and agrees to view in MyChart. Active MyChart status and patient understanding of how to access instructions and care plan via MyChart confirmed with patient.     Telephone follow up appointment with care management team member scheduled for:  11/02/2022 at 9:00 am.    Danford Bad, BSW, MSW, LCSW  Licensed Clinical Social Worker  [employer] [facility]  Mailing [place] [place], [place] [address] Physical Address-[address] [place], [place] [address] Toll Free Main # [phone] Fax # [phone] Cell #  [phone] [email or web address]

## 2022-11-01 ENCOUNTER — Ambulatory Visit: Payer: Medicare HMO | Admitting: Family Medicine

## 2022-11-02 ENCOUNTER — Encounter: Payer: Self-pay | Admitting: *Deleted

## 2022-11-02 ENCOUNTER — Ambulatory Visit: Payer: Self-pay | Admitting: *Deleted

## 2022-11-02 NOTE — Patient Outreach (Signed)
Care Coordination   Follow Up Visit Note   11/02/2022  Name: [name] MRN: [id number] DOB: 11-20-1955  [name] is a 67 y.o. year old male who sees [name], Jonna Coup, MD for primary care. I spoke with significant other, [name] by phone today.  What matters to the patients health and wellness today?  Receive Assistance Applying for Personal Care Services.   Goals Addressed               This Visit's Progress     Receive Assistance Applying for Personal Care Services. (pt-stated)   On track     Care Coordination Interventions:  Interventions Today    Flowsheet Row Most Recent Value  Chronic Disease   Chronic disease during today's visit Diabetes, Hypertension (HTN), Congestive Heart Failure (CHF), Chronic Kidney Disease/End Stage Renal Disease (ESRD), Other  [Major Depression, Single Episde, Severe]  General Interventions   General Interventions Discussed/Reviewed General Interventions Discussed, General Interventions Reviewed, Annual Eye Exam, Labs, Annual Foot Exam, Lipid Profile, Durable Medical Equipment (DME), Vaccines, Health Screening, [facility], Doctor Visits, Communication with, Level of Care  [facility] Care Provider & Representative with [facility]]  Labs Hgb A1c every 3 months, Kidney Function  Vaccines COVID-19, Flu, Pneumonia, RSV, Shingles  [Encouraged]  Doctor Visits Discussed/Reviewed Doctor Visits Discussed, Doctor Visits Reviewed, Annual Wellness Visits, PCP, Specialist  Health Screening Colonoscopy, Prostate  Durable Medical Equipment (DME) BP Cuff, Glucomoter, Insulin Pump, Oxygen, Walker, Psychologist, forensic  PCP/Specialist Visits Compliance with follow-up visit  Communication with PCP/Specialists  Level of Care Applications, Assisted Living, Skilled Nursing Facility, Teaching laboratory technician Personal Care Services  Exercise Interventions   Exercise Discussed/Reviewed Exercise Discussed,  Exercise Reviewed, Physical Activity, Weight Managment, Assistive device use and maintanence  Physical Activity Discussed/Reviewed Physical Activity Discussed, Physical Activity Reviewed, Types of exercise  [Encouraged]  Weight Management Weight maintenance  Education Interventions   Education Provided Provided Therapist, sports, Provided Education  Provided Verbal Education On Nutrition, Foot Care, Eye Care, Labs, Blood Sugar Monitoring, Mental Health/Coping with Illness, Applications, Exercise, Medication, When to see the doctor, [employer], Secondary school teacher  Mental Health Interventions   Mental Health Discussed/Reviewed Mental Health Discussed, Mental Health Reviewed, Coping Strategies, Crisis, Anxiety, Depression, Suicide  Nutrition Interventions   Nutrition Discussed/Reviewed Nutrition Discussed, Nutrition Reviewed, Adding fruits and vegetables, Fluid intake, Portion sizes, Decreasing sugar intake, Decreasing salt, Decreasing fats, Increaing proteins  Pharmacy Interventions   Pharmacy Dicussed/Reviewed Pharmacy Topics Discussed, Pharmacy Topics Reviewed, Medication Adherence, Affording Medications  Safety Interventions   Safety Discussed/Reviewed Safety Discussed, Safety Reviewed, Fall Risk, Home Safety  Home Engineer, water, Refer for community resources, Contact home health agency  Advanced Directive Interventions   Advanced Directives Discussed/Reviewed Advanced Directives Discussed, Advanced Directives Reviewed     Active listening & reflection utilized.  Verbalization of feelings encouraged. Emotional support provided. Feelings of caregiver fatigue validated. Caregiver stress & burnout acknowledged. Caregiver resources discussed. Caregiver support groups provided. Self-enrollment in caregiver support group of interest emphasized. Problem solving interventions indicated. Task-centered strategies activated. Solution-focused  activities employed. Cognitive Behavioral Therapy initiated. Acceptance & Commitment Therapy implemented. Client-Centered Therapy performed. CSW collaboration with caregiver, [name] to confirm continued residency at [facility] (# [phone]), to receive short-term rehabilitative services. CSW collaboration with caregiver, [name] to confirm desire to return home to live independently, upon completion of short-term rehabilitative services at [facility] (# [phone]). CSW collaboration with  caregiver, [name] to confirm initial home assessment with representative from [facility] [phone]), originally scheduled on 11/01/2022 at 1:00 pm, to be evaluated for Personal Care Services in the home, has been cancelled & will be rescheduled once patient returns home.  CSW collaboration with caregiver, [name] to encourage direct contact with CSW (# [phone]), if she has questions, needs assistance, or if additional social work needs are identified between now & our next scheduled follow-up outreach call.      [name] assessments and interventions completed:  Yes.  Care Coordination Interventions:  Yes, provided.   Follow up plan: Follow up call scheduled for 11/16/2022 at 9:00 am.  Encounter Outcome:  Pt. Visit Completed.   [name], BSW, MSW, LCSW  Licensed Restaurant manager, fast food [facility]  Mailing [place] [address] [place], [place] [address] Physical Address[address] [place], [place] [address] Toll Free Main # [phone] Fax # [phone] Cell # [phone] [email or web address]

## 2022-11-02 NOTE — Patient Instructions (Signed)
Visit Information  Thank you for taking time to visit with me today. Please don't hesitate to contact me if I can be of assistance to you.   Following are the goals we discussed today:   Goals Addressed               This Visit's Progress     Receive Assistance Applying for Personal Care Services. (pt-stated)   On track     Care Coordination Interventions:  Interventions Today    Flowsheet Row Most Recent Value  Chronic Disease   Chronic disease during today's visit Diabetes, Hypertension (HTN), Congestive Heart Failure (CHF), Chronic Kidney Disease/End Stage Renal Disease (ESRD), Other  [Major Depression, Single Episde, Severe]  General Interventions   General Interventions Discussed/Reviewed General Interventions Discussed, General Interventions Reviewed, Annual Eye Exam, Labs, Annual Foot Exam, Lipid Profile, Durable Medical Equipment (DME), Vaccines, Health Screening, [employer], Doctor Visits, Communication with, Level of Care  [facility] Care Provider & Representative with [facility]]  Labs Hgb A1c every 3 months, Kidney Function  Vaccines COVID-19, Flu, Pneumonia, RSV, Shingles  [Encouraged]  Doctor Visits Discussed/Reviewed Doctor Visits Discussed, Doctor Visits Reviewed, Annual Wellness Visits, PCP, Specialist  Health Screening Colonoscopy, Prostate  Durable Medical Equipment (DME) BP Cuff, Glucomoter, Insulin Pump, Oxygen, Walker, Psychologist, forensic  PCP/Specialist Visits Compliance with follow-up visit  Communication with PCP/Specialists  Level of Care Applications, Assisted Living, Skilled Nursing Facility, Teaching laboratory technician Personal Care Services  Exercise Interventions   Exercise Discussed/Reviewed Exercise Discussed, Exercise Reviewed, Physical Activity, Weight Managment, Assistive device use and maintanence  Physical Activity Discussed/Reviewed Physical Activity Discussed, Physical Activity Reviewed, Types of exercise   [Encouraged]  Weight Management Weight maintenance  Education Interventions   Education Provided Provided Therapist, sports, Provided Education  Provided Verbal Education On Nutrition, Foot Care, Eye Care, Labs, Blood Sugar Monitoring, Mental Health/Coping with Illness, Applications, Exercise, Medication, When to see the doctor, [employer], Secondary school teacher  Mental Health Interventions   Mental Health Discussed/Reviewed Mental Health Discussed, Mental Health Reviewed, Coping Strategies, Crisis, Anxiety, Depression, Suicide  Nutrition Interventions   Nutrition Discussed/Reviewed Nutrition Discussed, Nutrition Reviewed, Adding fruits and vegetables, Fluid intake, Portion sizes, Decreasing sugar intake, Decreasing salt, Decreasing fats, Increaing proteins  Pharmacy Interventions   Pharmacy Dicussed/Reviewed Pharmacy Topics Discussed, Pharmacy Topics Reviewed, Medication Adherence, Affording Medications  Safety Interventions   Safety Discussed/Reviewed Safety Discussed, Safety Reviewed, Fall Risk, Home Safety  Home Engineer, water, Refer for community resources, Contact home health agency  Advanced Directive Interventions   Advanced Directives Discussed/Reviewed Advanced Directives Discussed, Advanced Directives Reviewed     Active listening & reflection utilized.  Verbalization of feelings encouraged. Emotional support provided. Feelings of caregiver fatigue validated. Caregiver stress & burnout acknowledged. Caregiver resources discussed. Caregiver support groups provided. Self-enrollment in caregiver support group of interest emphasized. Problem solving interventions indicated. Task-centered strategies activated. Solution-focused activities employed. Cognitive Behavioral Therapy initiated. Acceptance & Commitment Therapy implemented. Client-Centered Therapy performed. CSW collaboration with caregiver, [name] to confirm  continued residency at [facility] (# [phone]), to receive short-term rehabilitative services. CSW collaboration with caregiver, [name] to confirm desire to return home to live independently, upon completion of short-term rehabilitative services at [facility] (# [phone]). CSW collaboration with caregiver, [name] to confirm initial home assessment with representative from [facility] [phone]), originally scheduled on 11/01/2022 at 1:00 pm, to be evaluated for Personal Care Services in the home,  has been cancelled & will be rescheduled once patient returns home.  CSW collaboration with caregiver, [name] to encourage direct contact with CSW (# [phone]), if she has questions, needs assistance, or if additional social work needs are identified between now & our next scheduled follow-up outreach call.      Our next appointment is by telephone on 11/16/2022 at 9:00 am.  Please call the care guide team at [phone] if you need to cancel or reschedule your appointment.   If you are experiencing a Mental Health or Behavioral Health Crisis or need someone to talk to, please call the Suicide and Crisis Lifeline: 988 call the [place] National Suicide Prevention Lifeline: 815-558-5506 or TTY: 269-579-1778 TTY 417 155 3747) to talk to a trained counselor call 1-800-273-TALK (toll free, 24 hour hotline) go to [facility] [address], [place] [phone]) call the [facility] Crisis Line: [phone] call 911  Patient verbalizes understanding of instructions and care plan provided today and agrees to view in MyChart. Active MyChart status and patient understanding of how to access instructions and care plan via MyChart confirmed with patient.     Telephone follow up appointment with care management team member scheduled for:  11/16/2022 at 9:00 am.  Danford Bad, BSW, MSW,  LCSW  Licensed Clinical Social Worker  [facility]  [place]. [address], [place], [place] [address] Physical Address-[address], [place], [place] [address] Toll Free Main # [phone] Fax # [phone] Cell # [phone] [email or web address]

## 2022-11-09 ENCOUNTER — Ambulatory Visit: Payer: Medicare HMO | Admitting: Family Medicine

## 2022-11-13 ENCOUNTER — Telehealth: Payer: Self-pay | Admitting: Family Medicine

## 2022-11-13 NOTE — Telephone Encounter (Signed)
Nurses I received a request from [facility] for clearance for him to come off of his anticoagulant for him to have a EGD.  #1 please find is a patient currently living at home or living in a assisted living-he has been in and out of the hospital several times I am not sure where he is currently  #2 is he aware of upcoming GI procedure?  #3 is he taking any type of anticoagulants currently?  Lovenox?  Eliquis?  Aspirin?  Plavix?  #4 please also within the documentation please note how I can connect with him if I have additional questions  Essentially let him know that [facility] gastroenterology wants him to come off anticoagulants per their protocol before procedure I need the above information in order to be able to send them updated information thank you

## 2022-11-14 NOTE — Telephone Encounter (Signed)
Patient stated he is at assisted living , is not aware of any GI appointments or procedures and has no idea if he is on any blood thinners

## 2022-11-16 ENCOUNTER — Ambulatory Visit: Payer: Self-pay | Admitting: *Deleted

## 2022-11-16 ENCOUNTER — Encounter: Payer: Self-pay | Admitting: *Deleted

## 2022-11-16 NOTE — Patient Instructions (Signed)
Visit Information  Thank you for taking time to visit with me today. Please don't hesitate to contact me if I can be of assistance to you.   Following are the goals we discussed today:   Goals Addressed               This Visit's Progress     Receive Assistance Applying for Personal Care Services. (pt-stated)   On track     Care Coordination Interventions:  Interventions Today    Flowsheet Row Most Recent Value  Chronic Disease   Chronic disease during today's visit Diabetes, Hypertension (HTN), Congestive Heart Failure (CHF), Chronic Kidney Disease/End Stage Renal Disease (ESRD), Other  [Major Depression, Single Episde, Severe]  General Interventions   General Interventions Discussed/Reviewed General Interventions Discussed, General Interventions Reviewed, Annual Eye Exam, Labs, Annual Foot Exam, Lipid Profile, Durable Medical Equipment (DME), Vaccines, Health Screening, [facility], Doctor Visits, Communication with, Level of Care  [facility] Care Provider & Representative with [facility]]  Labs Hgb A1c every 3 months, Kidney Function  Vaccines COVID-19, Flu, Pneumonia, RSV, Shingles  [Encouraged]  Doctor Visits Discussed/Reviewed Doctor Visits Discussed, Doctor Visits Reviewed, Annual Wellness Visits, PCP, Specialist  Health Screening Colonoscopy, Prostate  Durable Medical Equipment (DME) BP Cuff, Glucomoter, Insulin Pump, Oxygen, Walker, Psychologist, forensic  PCP/Specialist Visits Compliance with follow-up visit  Communication with PCP/Specialists  Level of Care Applications, Assisted Living, Skilled Nursing Facility, Teaching laboratory technician Personal Care Services  Exercise Interventions   Exercise Discussed/Reviewed Exercise Discussed, Exercise Reviewed, Physical Activity, Weight Managment, Assistive device use and maintanence  Physical Activity Discussed/Reviewed Physical Activity Discussed, Physical Activity Reviewed, Types of exercise   [Encouraged]  Weight Management Weight maintenance  Education Interventions   Education Provided Provided Therapist, sports, Provided Education  Provided Verbal Education On Nutrition, Foot Care, Eye Care, Labs, Blood Sugar Monitoring, Mental Health/Coping with Illness, Applications, Exercise, Medication, When to see the doctor, [facility], Secondary school teacher  Mental Health Interventions   Mental Health Discussed/Reviewed Mental Health Discussed, Mental Health Reviewed, Coping Strategies, Crisis, Anxiety, Depression, Suicide  Nutrition Interventions   Nutrition Discussed/Reviewed Nutrition Discussed, Nutrition Reviewed, Adding fruits and vegetables, Fluid intake, Portion sizes, Decreasing sugar intake, Decreasing salt, Decreasing fats, Increaing proteins  Pharmacy Interventions   Pharmacy Dicussed/Reviewed Pharmacy Topics Discussed, Pharmacy Topics Reviewed, Medication Adherence, Affording Medications  Safety Interventions   Safety Discussed/Reviewed Safety Discussed, Safety Reviewed, Fall Risk, Home Safety  Home Engineer, water, Refer for community resources, Contact home health agency  Advanced Directive Interventions   Advanced Directives Discussed/Reviewed Advanced Directives Discussed, Advanced Directives Reviewed     Active listening & reflection utilized.  Verbalization of feelings encouraged. Emotional & caregiver support provided. Feelings of frustration validated. Caregiver stress & burnout acknowledged. Problem solving interventions revisited. Task-centered strategies employed. Solution-focused activities activated. Cognitive Behavioral Therapy initiated. Acceptance & Commitment Therapy performed CSW collaboration with significant other, [name] to confirm continued residency at [facility] (# [phone]), to receive short-term rehabilitative services. CSW collaboration with significant other, [name] to obtain list of concerns to address with Director of Nursing at [facility] [phone]), which include all of the following:  ~ Fall sustained on 11/13/2022, "when dropped by nurse while trying to get back into bed".  ~ As of 11/16/2022, "no scan or x-ray has been performed, despite patient complaining of right hip & shoulder pain".  ~ Minimal access to clothing & personal belongings, "laundry  service has misplaced items & residents are seen wearing                 clothing".  ~ Encouraged to utilize wheelchair, "despite requests to ambulate and obtain maximum amount of physical &                 occupational therapy services".  ~ Cognition, health & overall well-being has declined significantly, "despite admission for short-term rehabilitative                 services on 09/17/2022". CSW collaboration with Interior and spatial designer of Nursing at [facility] (# [phone]), to address significant other, [name] Moore's concerns, as well as inquire about goals of care & discharge disposition.   ~ HIPAA compliant messages left on voicemail & with receptionist, as CSW awaits a return call. CSW collaboration with significant other, [name] to address patient's inability to return home to live independently, encouraging discussion with patient & consideration of long-term memory care assisted living facility placement, upon discharge from [facility] (# [phone]). CSW collaboration with significant other, [name] to encourage direct contact with CSW (# [phone]), if she has questions, needs assistance, or if additional social work needs are identified between now & our next scheduled follow-up outreach call.      Our next appointment is by telephone on 11/30/2022 at 9:00 am.  Please call the care guide team at [phone] if you need to cancel or reschedule your appointment.   If you are experiencing a Mental Health or Behavioral Health  Crisis or need someone to talk to, please call the Suicide and Crisis Lifeline: 988 call the [place] National Suicide Prevention Lifeline: (731) 358-4867 or TTY: 3672812131 TTY 952-193-3718) to talk to a trained counselor call 1-800-273-TALK (toll free, 24 hour hotline) go to [facility] [address], [place] [phone]) call the [facility] Crisis Line: [phone] call 911  Patient verbalizes understanding of instructions and care plan provided today and agrees to view in MyChart. Active MyChart status and patient understanding of how to access instructions and care plan via MyChart confirmed with patient.     Telephone follow up appointment with care management team member scheduled for:  11/30/2022 at 9:00 am.  Danford Bad, BSW, MSW, LCSW  Licensed Clinical Social Worker  [facility]  [place]ailing [place]. [address], [place], [place] [address] Physical Address[address], [place], [place] [address] Toll Free Main # [phone] Fax # [phone] Cell # [phone] [email or web address]

## 2022-11-16 NOTE — Patient Outreach (Signed)
Care Coordination   Follow Up Visit Note   11/16/2022  Name: [name] MRN: [id number] DOB: 1955/11/28  [name] is a 67 y.o. year old male who sees [name], Jonna Coup, MD for primary care. I spoke with patient's significant other, [name] by phone today.  What matters to the patients health and wellness today?  Receive Assistance Applying for Personal Care Services.   Goals Addressed               This Visit's Progress     Receive Assistance Applying for Personal Care Services. (pt-stated)   On track     Care Coordination Interventions:  Interventions Today    Flowsheet Row Most Recent Value  Chronic Disease   Chronic disease during today's visit Diabetes, Hypertension (HTN), Congestive Heart Failure (CHF), Chronic Kidney Disease/End Stage Renal Disease (ESRD), Other  [Major Depression, Single Episde, Severe]  General Interventions   General Interventions Discussed/Reviewed General Interventions Discussed, General Interventions Reviewed, Annual Eye Exam, Labs, Annual Foot Exam, Lipid Profile, Durable Medical Equipment (DME), Vaccines, Health Screening, [facility], Doctor Visits, Communication with, Level of Care  [facility] Care Provider & Representative with [facility]]  Labs Hgb A1c every 3 months, Kidney Function  Vaccines COVID-19, Flu, Pneumonia, RSV, Shingles  [Encouraged]  Doctor Visits Discussed/Reviewed Doctor Visits Discussed, Doctor Visits Reviewed, Annual Wellness Visits, PCP, Specialist  Health Screening Colonoscopy, Prostate  Durable Medical Equipment (DME) BP Cuff, Glucomoter, Insulin Pump, Oxygen, Walker, Psychologist, forensic  PCP/Specialist Visits Compliance with follow-up visit  Communication with PCP/Specialists  Level of Care Applications, Assisted Living, Skilled Nursing Facility, Teaching laboratory technician Personal Care Services  Exercise Interventions   Exercise Discussed/Reviewed Exercise  Discussed, Exercise Reviewed, Physical Activity, Weight Managment, Assistive device use and maintanence  Physical Activity Discussed/Reviewed Physical Activity Discussed, Physical Activity Reviewed, Types of exercise  [Encouraged]  Weight Management Weight maintenance  Education Interventions   Education Provided Provided Therapist, sports, Provided Education  Provided Verbal Education On Nutrition, Foot Care, Eye Care, Labs, Blood Sugar Monitoring, Mental Health/Coping with Illness, Applications, Exercise, Medication, When to see the doctor, [facility], Secondary school teacher  Mental Health Interventions   Mental Health Discussed/Reviewed Mental Health Discussed, Mental Health Reviewed, Coping Strategies, Crisis, Anxiety, Depression, Suicide  Nutrition Interventions   Nutrition Discussed/Reviewed Nutrition Discussed, Nutrition Reviewed, Adding fruits and vegetables, Fluid intake, Portion sizes, Decreasing sugar intake, Decreasing salt, Decreasing fats, Increaing proteins  Pharmacy Interventions   Pharmacy Dicussed/Reviewed Pharmacy Topics Discussed, Pharmacy Topics Reviewed, Medication Adherence, Affording Medications  Safety Interventions   Safety Discussed/Reviewed Safety Discussed, Safety Reviewed, Fall Risk, Home Safety  Home Engineer, water, Refer for community resources, Contact home health agency  Advanced Directive Interventions   Advanced Directives Discussed/Reviewed Advanced Directives Discussed, Advanced Directives Reviewed     Active listening & reflection utilized.  Verbalization of feelings encouraged. Emotional & caregiver support provided. Feelings of frustration validated. Caregiver stress & burnout acknowledged. Problem solving interventions revisited. Task-centered strategies employed. Solution-focused activities activated. Cognitive Behavioral Therapy initiated. Acceptance & Commitment Therapy performed CSW  collaboration with significant other, [name] to confirm continued residency at [facility] (# [phone]), to receive short-term rehabilitative services. CSW collaboration with significant other, [name] to obtain list of concerns to address with Director of Nursing at [facility] [phone]), which include all of the following:  ~ Fall sustained on 11/13/2022, "when dropped by nurse while trying to get back  into bed".  ~ As of 11/16/2022, "no scan or x-ray has been performed, despite patient complaining of right hip & shoulder pain".  ~ Minimal access to clothing & personal belongings, "laundry service has misplaced items & residents are seen wearing                 clothing".  ~ Encouraged to utilize wheelchair, "despite requests to ambulate and obtain maximum amount of physical &                 occupational therapy services".  ~ Cognition, health & overall well-being has declined significantly, "despite admission for short-term rehabilitative                 services on 09/17/2022". CSW collaboration with Interior and spatial designer of Nursing at [facility] (# [phone]), to address significant other, [name]'s concerns, as well as inquire about goals of care & discharge disposition.   ~ HIPAA compliant messages left on voicemail & with receptionist, as CSW awaits a return call. CSW collaboration with significant other, [name] to address patient's inability to return home to live independently, encouraging discussion with patient & consideration of long-term memory care assisted living facility placement, upon discharge from [facility] (# [phone]). CSW collaboration with significant other, [name] to encourage direct contact with CSW (# [phone]), if she has questions, needs assistance, or if additional social work needs are identified between now & our next scheduled follow-up outreach call.         [name] assessments and interventions completed:  Yes.  [name] Interventions Today    Flowsheet Row Most Recent Value  [name] Interventions   Food Insecurity Interventions Intervention Not Indicated  Housing Interventions Intervention Not Indicated  Transportation Interventions Converted Appointment to Virtual Visit, Patient Resources (Friends/Family), Other (Comment), [employer]it  [employer]]  Utilities Interventions Intervention Not Indicated  Alcohol Usage Interventions Intervention Not Indicated (Score <7)  Financial Strain Interventions Intervention Not Indicated  Physical Activity Interventions Intervention Not Indicated  Stress Interventions Intervention Not Indicated, Offered [employer], Provide Counseling  Social Connections Interventions Intervention Not Indicated     Care Coordination Interventions:  Yes, provided.   Follow up plan: Follow up call scheduled for 11/30/2022 at 9:00 am.  Encounter Outcome:  Pt. Visit Completed.   Danford Bad, BSW, MSW, LCSW  Licensed Restaurant manager, [employer] [facility]  [place], [place], [place] [address] Physical Address[address], [place], [place] [address] Toll Free Main # [phone] Fax # [phone] Cell # [phone] [email or web address]

## 2022-11-22 ENCOUNTER — Ambulatory Visit (HOSPITAL_BASED_OUTPATIENT_CLINIC_OR_DEPARTMENT_OTHER): Payer: 59 | Admitting: Family

## 2022-11-22 NOTE — Progress Notes (Deleted)
Cardiology Office Note:  .   Date:  11/22/2022  ID:  [name], DOB 1955/06/15, MRN [id number] PCP: Babs Sciara, MD  [facility] Providers Cardiologist:  Chilton Si, MD { Click to update primary MD,subspecialty MD or APP then REFRESH:1}   History of Present Illness: .   [name] is a 67 y.o. 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 Pleasant gentleman who previously worked as a Psychologist, counselling in [employer].   Blood pressure control has been difficult due to intolerance to amlodipine, lisinopril, hydrochlorothiazide, beta-blocker due to sensation of fatigue. Has been able to tolerate Doxazosin and Valsartan.    Admitted 12/14/2020 MRI revealed CVA. ZIO 12/24/20 with predominantly NSR, 3.5% PVC burden, 3 episodes NSVT up to 5 beats and up to 12 runs of SVT.  Carotid duplex 01/21/21 no significant stenosis bilaterally. Due to exertional dyspnea myoview performed 02/2021 with infarct with mild peri-infarct ischemia recommended for medical management given CKD.   Admitted 4/18 - 10/04/2021 to [facility] due to hematemesis.  Underwent EGD with RFA 09/27/2021.  Hemoglobin remained stable w/o transfusion.  GI felt symptoms were treated to recent RFA and did not recommend repeat endoscopy. He underwent PET scan due to known esophageal adenocarcinoma which oncology felt represented local irritation/inflammation and not progression.     Admitted 5/10 - 10/28/2021 with acute on chronic heart failure.  He was diuresed 8.2 L and down 17 pounds. Discharged on torsemide 40 mg daily.  He required 4 units PRBC.  EGD with esophageal stenosis, circumferential clean ulceration in distal esophagus. Discharge weight 187.66.   On 01/07/22 he started HD. Follows with Dr. Karalee Height.    Multiple admissions: 8/19-8/25/23 for chest pain. LHC  02/01/22 with patent grafts recommended for medical management. VT during HD 02/01/22 that did not recur.  9/18-9/26/23 treated for gram negative bacteremia. CT showed no PE but did show large right pleural effusion. Underwent 1.3L thoracentesis. Metoprolol succinate adjusted to 100mg  QD 11/21-11/23/23 with Enterobacter cloacae complex bacteremia with source felt to be [facility] treated with IV ABX. 12/15 - 05/31/2022 with hematemesis and acute blood loss anemia. Repeat EGD 05/30/2022 no active bleeding or stigmata of bleeding, moderately severe esophagitis with no bleeding, esophageal mucosal changes secondary to established short segment Barrett's disease.   06/23/2022 ED visit palpitations/chest pain while sitting. Chest pain deemed noncardiac, most likely due to GERD/esophageal cancer.  Metoprolol succinate switched to tartrate. One week monitor placed.  Did not tolerate tartrate and returned to succinate 3/7-3/10/24 atypical chest pain related to GI issues.  SVT requiring Levophed and amiodarone. 3/20 - 09/17/2022 hip fracture requiring surgical intervention 09/10/2022 discharged to SNF. 09/18/2022 ED visit for PSVT and suspected atrial fibrillation with RVR. Not recommended for OAC due to ESRD with anemia, falls, esophageal cancer.  Discharged on amiodarone, metoprolol.     ROS: ***  Studies Reviewed: Marland Kitchen    EKG:  ***  *** Risk Assessment/Calculations:    CHA2DS2-VASc Score = 7   This indicates a 11.2% annual risk of stroke. The patient's score is based upon: CHF History: 1 HTN History: 1 Diabetes History: 1 Stroke History: 2 Vascular Disease History: 1 Age Score: 1 Gender Score: 0   {This patient has a significant risk of stroke if diagnosed with atrial fibrillation.  Please consider VKA or DOAC agent for anticoagulation if the bleeding  risk is acceptable.   You can also use the SmartPhrase .[email or web address] for documentation.   :[id number]} No BP recorded.  {Refresh Note OR Click here to enter BP   :1}***       Physical Exam:   VS:  There were no vitals taken for this visit.   Wt Readings from Last 3 Encounters:  09/17/22 185 lb 6.5 oz (84.1 kg)  09/01/22 199 lb (90.3 kg)  08/20/22 190 lb 11.2 oz (86.5 kg)    GEN: Well nourished, well developed in no acute distress NECK: No JVD; No carotid bruits CARDIAC: ***RRR, no murmurs, rubs, gallops RESPIRATORY:  Clear to auscultation without rales, wheezing or rhonchi  ABDOMEN: Soft, non-tender, non-distended EXTREMITIES:  No edema; No deformity   ASSESSMENT AND PLAN: .   ***    {Are you ordering a CV Procedure (e.g. stress test, cath, DCCV, TEE, etc)?   Press F2        :[id number]}  Dispo: ***  Signed, Alver Sorrow, NP

## 2022-11-25 ENCOUNTER — Other Ambulatory Visit: Payer: Self-pay

## 2022-11-25 ENCOUNTER — Emergency Department (HOSPITAL_COMMUNITY): Payer: Medicare Other

## 2022-11-25 ENCOUNTER — Inpatient Hospital Stay (HOSPITAL_COMMUNITY)
Admission: EM | Admit: 2022-11-25 | Discharge: 2022-11-29 | DRG: 308 | Disposition: A | Discharge status: Skilled Nursing Facility | Payer: Medicare Other | Source: Skilled Nursing Facility | Attending: Family Medicine | Admitting: Family Medicine

## 2022-11-25 ENCOUNTER — Observation Stay (HOSPITAL_COMMUNITY): Payer: Medicare Other

## 2022-11-25 DIAGNOSIS — C159 Malignant neoplasm of esophagus, unspecified: Secondary | ICD-10-CM | POA: Diagnosis present

## 2022-11-25 DIAGNOSIS — I2489 Other forms of acute ischemic heart disease: Secondary | ICD-10-CM | POA: Diagnosis present

## 2022-11-25 DIAGNOSIS — Z91030 Bee allergy status: Secondary | ICD-10-CM

## 2022-11-25 DIAGNOSIS — Z833 Family history of diabetes mellitus: Secondary | ICD-10-CM

## 2022-11-25 DIAGNOSIS — I471 Supraventricular tachycardia, unspecified: Secondary | ICD-10-CM | POA: Diagnosis present

## 2022-11-25 DIAGNOSIS — S2232XA Fracture of one rib, left side, initial encounter for closed fracture: Secondary | ICD-10-CM | POA: Diagnosis present

## 2022-11-25 DIAGNOSIS — Z8673 Personal history of transient ischemic attack (TIA), and cerebral infarction without residual deficits: Secondary | ICD-10-CM

## 2022-11-25 DIAGNOSIS — Z5982 Transportation insecurity: Secondary | ICD-10-CM

## 2022-11-25 DIAGNOSIS — Z66 Do not resuscitate: Secondary | ICD-10-CM | POA: Diagnosis present

## 2022-11-25 DIAGNOSIS — I5042 Chronic combined systolic (congestive) and diastolic (congestive) heart failure: Secondary | ICD-10-CM | POA: Diagnosis present

## 2022-11-25 DIAGNOSIS — N186 End stage renal disease: Secondary | ICD-10-CM | POA: Diagnosis present

## 2022-11-25 DIAGNOSIS — Z87891 Personal history of nicotine dependence: Secondary | ICD-10-CM

## 2022-11-25 DIAGNOSIS — I10 Essential (primary) hypertension: Secondary | ICD-10-CM | POA: Diagnosis present

## 2022-11-25 DIAGNOSIS — Z86010 Personal history of colonic polyps: Secondary | ICD-10-CM

## 2022-11-25 DIAGNOSIS — Z992 Dependence on renal dialysis: Secondary | ICD-10-CM

## 2022-11-25 DIAGNOSIS — R7989 Other specified abnormal findings of blood chemistry: Secondary | ICD-10-CM | POA: Diagnosis present

## 2022-11-25 DIAGNOSIS — Z800 Family history of malignant neoplasm of digestive organs: Secondary | ICD-10-CM

## 2022-11-25 DIAGNOSIS — Z888 Allergy status to other drugs, medicaments and biological substances status: Secondary | ICD-10-CM

## 2022-11-25 DIAGNOSIS — I252 Old myocardial infarction: Secondary | ICD-10-CM

## 2022-11-25 DIAGNOSIS — R000 Tachycardia, unspecified: Secondary | ICD-10-CM

## 2022-11-25 DIAGNOSIS — J90 Pleural effusion, not elsewhere classified: Secondary | ICD-10-CM

## 2022-11-25 DIAGNOSIS — Z794 Long term (current) use of insulin: Secondary | ICD-10-CM

## 2022-11-25 DIAGNOSIS — I4891 Unspecified atrial fibrillation: Secondary | ICD-10-CM | POA: Diagnosis present

## 2022-11-25 DIAGNOSIS — Z79899 Other long term (current) drug therapy: Secondary | ICD-10-CM

## 2022-11-25 DIAGNOSIS — J9610 Chronic respiratory failure, unspecified whether with hypoxia or hypercapnia: Secondary | ICD-10-CM | POA: Diagnosis present

## 2022-11-25 DIAGNOSIS — I959 Hypotension, unspecified: Secondary | ICD-10-CM | POA: Diagnosis present

## 2022-11-25 DIAGNOSIS — I4719 Other supraventricular tachycardia: Secondary | ICD-10-CM | POA: Diagnosis not present

## 2022-11-25 DIAGNOSIS — E119 Type 2 diabetes mellitus without complications: Secondary | ICD-10-CM

## 2022-11-25 DIAGNOSIS — N250 Renal osteodystrophy: Secondary | ICD-10-CM | POA: Diagnosis present

## 2022-11-25 DIAGNOSIS — Z8249 Family history of ischemic heart disease and other diseases of the circulatory system: Secondary | ICD-10-CM

## 2022-11-25 DIAGNOSIS — Z87892 Personal history of anaphylaxis: Secondary | ICD-10-CM

## 2022-11-25 DIAGNOSIS — I132 Hypertensive heart and chronic kidney disease with heart failure and with stage 5 chronic kidney disease, or end stage renal disease: Secondary | ICD-10-CM | POA: Diagnosis present

## 2022-11-25 DIAGNOSIS — S72009A Fracture of unspecified part of neck of unspecified femur, initial encounter for closed fracture: Secondary | ICD-10-CM | POA: Diagnosis present

## 2022-11-25 DIAGNOSIS — H5461 Unqualified visual loss, right eye, normal vision left eye: Secondary | ICD-10-CM | POA: Diagnosis present

## 2022-11-25 DIAGNOSIS — Z951 Presence of aortocoronary bypass graft: Secondary | ICD-10-CM

## 2022-11-25 DIAGNOSIS — E1122 Type 2 diabetes mellitus with diabetic chronic kidney disease: Secondary | ICD-10-CM | POA: Diagnosis present

## 2022-11-25 DIAGNOSIS — J9811 Atelectasis: Secondary | ICD-10-CM | POA: Diagnosis present

## 2022-11-25 DIAGNOSIS — R079 Chest pain, unspecified: Secondary | ICD-10-CM | POA: Insufficient documentation

## 2022-11-25 DIAGNOSIS — S72001D Fracture of unspecified part of neck of right femur, subsequent encounter for closed fracture with routine healing: Secondary | ICD-10-CM

## 2022-11-25 DIAGNOSIS — D631 Anemia in chronic kidney disease: Secondary | ICD-10-CM | POA: Diagnosis present

## 2022-11-25 DIAGNOSIS — I493 Ventricular premature depolarization: Secondary | ICD-10-CM | POA: Diagnosis present

## 2022-11-25 DIAGNOSIS — I2510 Atherosclerotic heart disease of native coronary artery without angina pectoris: Secondary | ICD-10-CM | POA: Diagnosis present

## 2022-11-25 DIAGNOSIS — Z1152 Encounter for screening for COVID-19: Secondary | ICD-10-CM

## 2022-11-25 LAB — CBC WITH DIFFERENTIAL/PLATELET
Abs Immature Granulocytes: 0.04 10*3/uL (ref 0.00–0.07)
Basophils Absolute: 0.1 10*3/uL (ref 0.0–0.1)
Basophils Relative: 1 %
Eosinophils Absolute: 0.2 10*3/uL (ref 0.0–0.5)
Eosinophils Relative: 3 %
HCT: 35.9 % — ABNORMAL LOW (ref 39.0–52.0)
Hemoglobin: 10.7 g/dL — ABNORMAL LOW (ref 13.0–17.0)
Immature Granulocytes: 1 %
Lymphocytes Relative: 15 %
Lymphs Abs: 1.3 10*3/uL (ref 0.7–4.0)
MCH: 25.6 pg — ABNORMAL LOW (ref 26.0–34.0)
MCHC: 29.8 g/dL — ABNORMAL LOW (ref 30.0–36.0)
MCV: 85.9 fL (ref 80.0–100.0)
Monocytes Absolute: 0.7 10*3/uL (ref 0.1–1.0)
Monocytes Relative: 8 %
Neutro Abs: 6.2 10*3/uL (ref 1.7–7.7)
Neutrophils Relative %: 72 %
Platelets: 290 10*3/uL (ref 150–400)
RBC: 4.18 MIL/uL — ABNORMAL LOW (ref 4.22–5.81)
RDW: 16.9 % — ABNORMAL HIGH (ref 11.5–15.5)
WBC: 8.5 10*3/uL (ref 4.0–10.5)
nRBC: 0 % (ref 0.0–0.2)

## 2022-11-25 LAB — COMPREHENSIVE METABOLIC PANEL
ALT: 8 U/L (ref 0–44)
AST: 12 U/L — ABNORMAL LOW (ref 15–41)
Albumin: 3.1 g/dL — ABNORMAL LOW (ref 3.5–5.0)
Alkaline Phosphatase: 96 U/L (ref 38–126)
Anion gap: 15 (ref 5–15)
BUN: 35 mg/dL — ABNORMAL HIGH (ref 8–23)
CO2: 24 mmol/L (ref 22–32)
Calcium: 8.6 mg/dL — ABNORMAL LOW (ref 8.9–10.3)
Chloride: 98 mmol/L (ref 98–111)
Creatinine, Ser: 4.39 mg/dL — ABNORMAL HIGH (ref 0.61–1.24)
GFR, Estimated: 14 mL/min — ABNORMAL LOW (ref >60)
Glucose, Bld: 128 mg/dL — ABNORMAL HIGH (ref 70–99)
Potassium: 4 mmol/L (ref 3.5–5.1)
Sodium: 137 mmol/L (ref 135–145)
Total Bilirubin: 0.8 mg/dL (ref 0.3–1.2)
Total Protein: 6.5 g/dL (ref 6.5–8.1)

## 2022-11-25 LAB — I-STAT ARTERIAL BLOOD GAS, ED
Acid-Base Excess: 2 mmol/L (ref 0.0–2.0)
Bicarbonate: 26 mmol/L (ref 20.0–28.0)
Calcium, Ion: 1.11 mmol/L — ABNORMAL LOW (ref 1.15–1.40)
HCT: 31 % — ABNORMAL LOW (ref 39.0–52.0)
Hemoglobin: 10.5 g/dL — ABNORMAL LOW (ref 13.0–17.0)
O2 Saturation: 96 %
Patient temperature: 97.5
Potassium: 3.5 mmol/L (ref 3.5–5.1)
Sodium: 138 mmol/L (ref 135–145)
TCO2: 27 mmol/L (ref 22–32)
pCO2 arterial: 36.9 mmHg (ref 32–48)
pH, Arterial: 7.453 — ABNORMAL HIGH (ref 7.35–7.45)
pO2, Arterial: 75 mmHg — ABNORMAL LOW (ref 83–108)

## 2022-11-25 LAB — MAGNESIUM: Magnesium: 2 mg/dL (ref 1.7–2.4)

## 2022-11-25 LAB — I-STAT CHEM 8, ED
BUN: 37 mg/dL — ABNORMAL HIGH (ref 8–23)
Calcium, Ion: 0.97 mmol/L — ABNORMAL LOW (ref 1.15–1.40)
Chloride: 101 mmol/L (ref 98–111)
Creatinine, Ser: 4.6 mg/dL — ABNORMAL HIGH (ref 0.61–1.24)
Glucose, Bld: 133 mg/dL — ABNORMAL HIGH (ref 70–99)
HCT: 35 % — ABNORMAL LOW (ref 39.0–52.0)
Hemoglobin: 11.9 g/dL — ABNORMAL LOW (ref 13.0–17.0)
Potassium: 4.2 mmol/L (ref 3.5–5.1)
Sodium: 136 mmol/L (ref 135–145)
TCO2: 26 mmol/L (ref 22–32)

## 2022-11-25 LAB — TROPONIN I (HIGH SENSITIVITY)
Troponin I (High Sensitivity): 24 ng/L — ABNORMAL HIGH (ref <18)
Troponin I (High Sensitivity): 155 ng/L (ref <18)
Troponin I (High Sensitivity): 463 ng/L (ref <18)
Troponin I (High Sensitivity): 553 ng/L (ref <18)
Troponin I (High Sensitivity): 608 ng/L (ref <18)
Troponin I (High Sensitivity): 595 ng/L (ref <18)

## 2022-11-25 LAB — HEPATITIS B SURFACE ANTIGEN: Hepatitis B Surface Ag: NONREACTIVE

## 2022-11-25 LAB — RESP PANEL BY RT-PCR (RSV, FLU A&B, COVID)  RVPGX2
Influenza A by PCR: NEGATIVE
Influenza B by PCR: NEGATIVE
Resp Syncytial Virus by PCR: NEGATIVE
SARS Coronavirus 2 by RT PCR: NEGATIVE

## 2022-11-25 LAB — GLUCOSE, CAPILLARY
Glucose-Capillary: 123 mg/dL — ABNORMAL HIGH (ref 70–99)
Glucose-Capillary: 190 mg/dL — ABNORMAL HIGH (ref 70–99)

## 2022-11-25 LAB — LACTIC ACID, PLASMA
Lactic Acid, Venous: 2.4 mmol/L (ref 0.5–1.9)
Lactic Acid, Venous: 1.2 mmol/L (ref 0.5–1.9)

## 2022-11-25 LAB — PROTIME-INR
INR: 1.2 (ref 0.8–1.2)
Prothrombin Time: 15.3 seconds — ABNORMAL HIGH (ref 11.4–15.2)

## 2022-11-25 LAB — APTT: aPTT: 34 seconds (ref 24–36)

## 2022-11-25 MED ORDER — SODIUM CHLORIDE 0.9 % IV SOLN: 2.0000 g | Freq: Once | INTRAVENOUS | Status: DC

## 2022-11-25 MED ORDER — AMIODARONE HCL 200 MG PO TABS
200.0000 mg | ORAL_TABLET | Freq: Two times a day (BID) | ORAL | Status: DC
  Administered 2022-11-25: 200 mg via ORAL
  Filled 2022-11-25: qty 1

## 2022-11-25 MED ORDER — LACTATED RINGERS IV SOLN: INTRAVENOUS | Status: DC

## 2022-11-25 MED ORDER — CHLORHEXIDINE GLUCONATE CLOTH 2 % EX PADS
6.0000 | MEDICATED_PAD | Freq: Every day | CUTANEOUS | Status: DC
  Administered 2022-11-26 – 2022-11-29 (×3): 6 via TOPICAL

## 2022-11-25 MED ORDER — LACTATED RINGERS IV BOLUS (SEPSIS): 1000.0000 mL | Freq: Once | INTRAVENOUS | Status: DC

## 2022-11-25 MED ORDER — HEPARIN SODIUM (PORCINE) 5000 UNIT/ML IJ SOLN
5000.0000 [IU] | Freq: Three times a day (TID) | INTRAMUSCULAR | Status: DC
  Administered 2022-11-25 – 2022-11-29 (×11): 5000 [IU] via SUBCUTANEOUS
  Filled 2022-11-25 (×11): qty 1

## 2022-11-25 MED ORDER — METOPROLOL TARTRATE 25 MG PO TABS
25.0000 mg | ORAL_TABLET | Freq: Two times a day (BID) | ORAL | Status: DC
  Administered 2022-11-25 – 2022-11-29 (×8): 25 mg via ORAL
  Filled 2022-11-25 (×8): qty 1

## 2022-11-25 MED ORDER — VANCOMYCIN HCL IN DEXTROSE 1-5 GM/200ML-% IV SOLN: 1000.0000 mg | Freq: Once | INTRAVENOUS | Status: DC

## 2022-11-25 MED ORDER — LACTATED RINGERS IV BOLUS (SEPSIS)
1000.0000 mL | Freq: Once | INTRAVENOUS | Status: AC
  Administered 2022-11-25: 1000 mL via INTRAVENOUS

## 2022-11-25 MED ORDER — IOHEXOL 350 MG/ML SOLN
100.0000 mL | Freq: Once | INTRAVENOUS | Status: AC | PRN
  Administered 2022-11-25: 100 mL via INTRAVENOUS

## 2022-11-25 MED ORDER — METRONIDAZOLE 500 MG/100ML IV SOLN: 500.0000 mg | Freq: Once | INTRAVENOUS | Status: DC

## 2022-11-25 MED ORDER — AMIODARONE HCL IN DEXTROSE 360-4.14 MG/200ML-% IV SOLN
60.0000 mg/h | INTRAVENOUS | Status: AC
  Administered 2022-11-25: 60 mg/h via INTRAVENOUS
  Filled 2022-11-25: qty 200

## 2022-11-25 MED ORDER — AMIODARONE HCL 200 MG PO TABS: 200.0000 mg | ORAL_TABLET | Freq: Every day | ORAL | Status: DC

## 2022-11-25 MED ORDER — VANCOMYCIN HCL 2000 MG/400ML IV SOLN
2000.0000 mg | Freq: Once | INTRAVENOUS | Status: DC
  Filled 2022-11-25: qty 400

## 2022-11-25 NOTE — Sepsis Progress Note (Signed)
Elink is following code sepsis 

## 2022-11-25 NOTE — ED Provider Notes (Signed)
[place] EMERGENCY DEPARTMENT AT [facility] Provider Note   CSN: [id number] Arrival date & time: 11/25/22  0800     History  Chief Complaint  Patient presents with   Chest Pain    SNF. Onset this am with CP upon waking up, hemodialysis MWF. Did not go today. A & O. Wears 3L [place] at baseline. CABG x 1 year ago.     [name] is a 67 y.o. male.  HPI Level 5 Secondary to hypotension and tachycardia 67 year old male presents today via EMS as they were called out to his skilled nursing facility for hypotension and tachycardia. Patient on dialysis mwf.      Patient states he woke this morning with chest pain which is now in his abdomen.  He feels severely weak.  He states that he is at the facility for rehab after a right hip fracture and has been doing some weightbearing.  He denies fever, chills, headache, cough, infectious symptoms.     Home Medications Prior to Admission medications   Medication Sig Start Date End Date Taking? Authorizing Provider  acetaminophen (TYLENOL) 325 MG tablet Take 2 tablets (650 mg total) by mouth every 6 (six) hours as needed for mild pain, moderate pain or fever. 08/02/21   Drema Dallas, MD  albuterol (VENTOLIN HFA) 108 (90 Base) MCG/ACT inhaler INHALE TWO PUFFS BY MOUTH EVERY 6 HOURS AS NEEDED Patient taking differently: Inhale 2 puffs into the lungs every 6 (six) hours as needed for wheezing or shortness of breath. 08/11/22   Babs Sciara, MD  amiodarone (PACERONE) 200 MG tablet Take 1 tablet (200 mg total) by mouth 2 (two) times daily. Through 09/08/22.  Then 1 tab (200 mg) once daily starting 09/09/22 09/17/22   Jerald Kief, MD  bisacodyl (DULCOLAX) 10 MG suppository Place 1 suppository (10 mg total) rectally daily as needed for moderate constipation. 09/17/22   Jerald Kief, MD  calcium acetate (PHOSLO) 667 MG capsule Take 667 mg by mouth in the morning, at noon, and at bedtime.    [provider]  doxazosin (CARDURA)  2 MG tablet TAKE 1 TABLET BY MOUTH DAILY 07/29/22   Babs Sciara, MD  enoxaparin (LOVENOX) 30 MG/0.3ML injection Inject 0.3 mLs (30 mg total) into the skin daily for 21 days. 09/17/22 10/08/22  Jerald Kief, MD  EPINEPHrine 0.3 mg/0.3 mL IJ SOAJ injection Inject 0.3 mg into the muscle as needed for anaphylaxis. 01/25/21   Babs Sciara, MD  Evolocumab (REPATHA SURECLICK) 140 MG/ML SOAJ Inject 140 mg into the skin every 14 (fourteen) days. 06/29/22   Alver Sorrow, NP  hydrALAZINE (APRESOLINE) 50 MG tablet Take 1 tablet (50 mg total) by mouth 3 (three) times daily. 06/23/22   Shon Hale, MD  LANTUS 100 UNIT/ML injection 8 mLs daily. Sliding scale 04/07/22   [provider]  methocarbamol (ROBAXIN) 500 MG tablet Take 1 tablet (500 mg total) by mouth every 8 (eight) hours as needed for muscle spasms. 09/17/22   Jerald Kief, MD  metoprolol tartrate (LOPRESSOR) 25 MG tablet Take 1 tablet (25 mg total) by mouth 2 (two) times daily. In addition you can take a 3rd dose of metoprolol as needed for heart rate > 120 09/22/22   Gwyneth Sprout, MD  nitroGLYCERIN (NITROSTAT) 0.4 MG SL tablet Place 1 tablet (0.4 mg total) under the tongue every 5 (five) minutes as needed for chest pain. 09/14/21   Alver Sorrow, NP  NOVOLOG FLEXPEN 100 UNIT/ML FlexPen Sliding scale AC/HS: CBG 70 - 120: 0 units  CBG 121 - 150: 2 units  CBG 151 - 200: 3 units  CBG 201 - 250: 5 units  CBG 251 - 300: 8 units  CBG 301 - 350: 11 units  CBG 351 - 400: 15 units  CBG > 400: call MD 09/17/22   Jerald Kief, MD  ondansetron (ZOFRAN-ODT) 4 MG disintegrating tablet Take 1 tablet by mouth every 8 hours as needed for nausea and vomiting. 08/02/22   Alver Sorrow, NP  oxyCODONE (OXY IR/ROXICODONE) 5 MG immediate release tablet Take 1-2 tablets (5-10 mg total) by mouth every 4 (four) hours as needed for moderate pain or severe pain. 09/17/22   Jerald Kief, MD  pantoprazole (PROTONIX) 40 MG tablet Take 1 tablet  (40 mg total) by mouth 2 (two) times daily. 05/30/22   Catarina Hartshorn, MD  polyethylene glycol (MIRALAX) 17 g packet Take 17 g by mouth 2 (two) times daily. 09/17/22   Jerald Kief, MD  sertraline (ZOLOFT) 100 MG tablet TAKE 1 TABLET BY MOUTH DAILY 07/29/22   Babs Sciara, MD      Allergies    Bee venom, Atorvastatin, Diltiazem, Lopressor [metoprolol], Reglan [metoclopramide], Rosuvastatin, and Valsartan    Review of Systems   Review of Systems  Physical Exam Updated Vital Signs BP (!) 144/64   Pulse 81   Temp (!) 97.5 F (36.4 C) (Oral)   Resp 14   Wt 85.1 kg   SpO2 100%   BMI 26.92 kg/m  Physical Exam Vitals reviewed.  Constitutional:      General: He is not in acute distress.    Appearance: He is ill-appearing.  HENT:     Head: Normocephalic.  Eyes:     Pupils: Pupils are equal, round, and reactive to light.  Cardiovascular:     Rate and Rhythm: Regular rhythm. Tachycardia present.  Pulmonary:     Effort: Tachypnea present.     Breath sounds: Examination of the right-lower field reveals decreased breath sounds. Decreased breath sounds present.  Abdominal:     General: Bowel sounds are normal.     Palpations: Abdomen is soft.  Musculoskeletal:     Right lower leg: No edema.     Left lower leg: No edema.  Skin:    Capillary Refill: Capillary refill takes less than 2 seconds.     Coloration: Skin is pale.  Neurological:     General: No focal deficit present.     Mental Status: He is alert and oriented to person, place, and time.     Comments: Diffuse weakness     ED Results / Procedures / Treatments   Labs (all labs ordered are listed, but only abnormal results are displayed) Labs Reviewed  LACTIC ACID, PLASMA - Abnormal; Notable for the following components:      Result Value   Lactic Acid, Venous 2.4 (*)    All other components within normal limits  COMPREHENSIVE METABOLIC PANEL - Abnormal; Notable for the following components:   Glucose, Bld 128 (*)     BUN 35 (*)    Creatinine, Ser 4.39 (*)    Calcium 8.6 (*)    Albumin 3.1 (*)    AST 12 (*)    GFR, Estimated 14 (*)    All other components within normal limits  CBC WITH DIFFERENTIAL/PLATELET - Abnormal; Notable for the following components:   RBC 4.18 (*)  Hemoglobin 10.7 (*)    HCT 35.9 (*)    MCH 25.6 (*)    MCHC 29.8 (*)    RDW 16.9 (*)    All other components within normal limits  PROTIME-INR - Abnormal; Notable for the following components:   Prothrombin Time 15.3 (*)    All other components within normal limits  I-STAT CHEM 8, ED - Abnormal; Notable for the following components:   BUN 37 (*)    Creatinine, Ser 4.60 (*)    Glucose, Bld 133 (*)    Calcium, Ion 0.97 (*)    Hemoglobin 11.9 (*)    HCT 35.0 (*)    All other components within normal limits  I-STAT ARTERIAL BLOOD GAS, ED - Abnormal; Notable for the following components:   pH, Arterial 7.453 (*)    pO2, Arterial 75 (*)    Calcium, Ion 1.11 (*)    HCT 31.0 (*)    Hemoglobin 10.5 (*)    All other components within normal limits  TROPONIN I (HIGH SENSITIVITY) - Abnormal; Notable for the following components:   Troponin I (High Sensitivity) 24 (*)    All other components within normal limits  RESP PANEL BY RT-PCR (RSV, FLU A&B, COVID)  RVPGX2  CULTURE, BLOOD (ROUTINE X 2)  CULTURE, BLOOD (ROUTINE X 2)  APTT  LACTIC ACID, PLASMA  URINALYSIS, W/ REFLEX TO CULTURE (INFECTION SUSPECTED)  TROPONIN I (HIGH SENSITIVITY)    EKG EKG Interpretation  Date/Time:  Friday November 25 2022 08:29:44 EDT Ventricular Rate:  88 PR Interval:  204 QRS Duration: 129 QT Interval:  424 QTC Calculation: 513 R Axis:   112 Text Interpretation: Sinus rhythm Ventricular bigeminy Nonspecific intraventricular conduction delay Probable inferior infarct, age indeterminate Lateral leads are also involved Confirmed by Margarita Grizzle [id number]) on 11/25/2022 8:43:12 AM  Radiology CT Angio Chest/Abd/Pel for Dissection W and/or Wo  Contrast  Result Date: 11/25/2022 CLINICAL DATA:  Concern for thoracic aortic dissection.  Chest pain. EXAM: CT ANGIOGRAPHY CHEST, ABDOMEN AND PELVIS TECHNIQUE: Non-contrast CT of the chest was initially obtained. Multidetector CT imaging through the chest, abdomen and pelvis was performed using the standard protocol during bolus administration of intravenous contrast. Multiplanar reconstructed images and MIPs were obtained and reviewed to evaluate the vascular anatomy. RADIATION DOSE REDUCTION: This exam was performed according to the departmental dose-optimization program which includes automated exposure control, adjustment of the mA and/or kV according to patient size and/or use of iterative reconstruction technique. CONTRAST:  OMNIPAQUE IOHEXOL 350 MG/ML SOLN COMPARISON:  None Available. FINDINGS: CTA CHEST FINDINGS Cardiovascular: Noncontrast series demonstrates no intramural hematoma within the thoracic aorta. Contrast enhanced imaging demonstrates no dissection or aneurysm of the thoracic aorta. Post CABG anatomy. No pericardial fluid. No pulmonary embolism. Mediastinum/Nodes: No axillary or supraclavicular adenopathy. No mediastinal or hilar adenopathy. No pericardial fluid. Esophagus normal. Lungs/Pleura: Moderate layering RIGHT pleural effusion. Mild RIGHT basilar atelectasis. No pulmonary infarction identified. No pneumonia. Musculoskeletal: Fracture of the posterior LEFT sixth rib (image 52/8). Healed fractures of lower posterior RIGHT ribs on image 137. Review of the MIP images confirms the above findings. CTA ABDOMEN AND PELVIS FINDINGS VASCULAR Aorta: Normal caliber aorta without aneurysm, dissection, vasculitis or significant stenosis. Celiac: Patent without evidence of aneurysm, dissection, vasculitis or significant stenosis. SMA: Patent without evidence of aneurysm, dissection, vasculitis or significant stenosis. Renals: Both renal arteries are patent without evidence of aneurysm,  dissection, vasculitis, fibromuscular dysplasia or significant stenosis. IMA: Patent without evidence of aneurysm, dissection, vasculitis or significant stenosis.  Inflow: Patent without evidence of aneurysm, dissection, vasculitis or significant stenosis. Veins: No obvious venous abnormality within the limitations of this arterial phase study. Review of the MIP images confirms the above findings. NON-VASCULAR Hepatobiliary: No focal hepatic lesion. Normal gallbladder. No biliary duct dilatation. Common bile duct is normal. Pancreas: Pancreas is normal. No ductal dilatation. No pancreatic inflammation. Spleen: Normal spleen Adrenals/urinary tract: Adrenal glands and kidneys are normal. Bilateral simple fluid attenuation Bosniak 1 renal cysts. No follow-up recommended. The ureters and bladder normal. Stomach/Bowel: Stomach, small bowel, appendix, and cecum are normal. The colon and rectosigmoid colon are normal. Multiple diverticula of the descending colon and sigmoid colon without acute inflammation. Vascular/Lymphatic: Abdominal aorta is normal caliber. No periportal or retroperitoneal adenopathy. No pelvic adenopathy. Reproductive: Other: No free fluid. Musculoskeletal: No aggressive osseous lesion. Intramedullary nail fixation of the RIGHT femur with associated postprocedural inflammatory change. Review of the MIP images confirms the above findings. IMPRESSION: CHEST IMPRESSION: 1. No evidence of thoracic aortic dissection or aneurysm. 2. No pulmonary embolism. 3. Moderate RIGHT pleural effusion with RIGHT basilar atelectasis. 4. Fracture of the posterior LEFT sixth rib is age indeterminate. Fracture of the posterior RIGHT ribs are chronic. PELVIS IMPRESSION: 1. No evidence of abdominal aortic aneurysm or dissection. 2. No acute findings in the abdomen pelvis. 3. LEFT colon diverticulosis without evidence diverticulitis. 4. RIGHT femur internal fixation Electronically Signed   By: Genevive Bi M.D.   On:  11/25/2022 09:44   DG Chest Port 1 View  Result Date: 11/25/2022 CLINICAL DATA:  Questionable sepsis EXAM: PORTABLE CHEST 1 VIEW COMPARISON:  Chest radiograph dated 09/22/2022 FINDINGS: Low lung volumes with bronchovascular crowding. Right basilar patchy opacities. Trace right pleural effusion. No pneumothorax. Enlarged cardiomediastinal silhouette. Median sternotomy wires are nondisplaced. IMPRESSION: 1. Right basilar patchy opacities, which may represent atelectasis, aspiration, or pneumonia. 2. Trace right pleural effusion. 3. Cardiomegaly. Electronically Signed   By: Agustin Cree M.D.   On: 11/25/2022 09:04    Procedures Procedures    Medications Ordered in ED Medications  lactated ringers infusion ( Intravenous Not Given 11/25/22 1013)  lactated ringers bolus 1,000 mL (0 mLs Intravenous Stopped 11/25/22 0849)  iohexol (OMNIPAQUE) 350 MG/ML injection 100 mL (100 mLs Intravenous Contrast Given 11/25/22 0914)    ED Course/ Medical Decision Making/ A&P Clinical Course as of 11/25/22 1016  Fri Nov 25, 2022  0919 CBC hgb decreased at 10 improved from first prior of 9  [DR]  0920 Abg 7.45/36/75 [DR]  0921 Cxr Right basilar opacities [DR]  1003 CT chest reviewed and interpreted and no evidence of thoracic aortic dissection or aneurysm, no pulmonary embolism, moderate right pleural effusion with right basilar atelectasis and age-indeterminate fracture of the left posterior sixth rib Pelvis without acute findings noted [DR]    Clinical Course User Index [DR] Margarita Grizzle, MD                             Medical Decision Making Amount and/or Complexity of Data Reviewed Labs: ordered. Radiology: ordered. ECG/medicine tests: ordered.  Risk Prescription drug management.   Patient presented with hypotension and tachycardia HR 150 BP 70/30 Large bore ivs placed jPatient on home oxygen Labs ordered IV fluid bolus ordered- 30 ml/kg Patient hr now 89 BP 140 Sudden change in hr, likely  arrhythmia Sister, Alexy Stiteler, on phone.  She states ho p afib, cabg, possible svt ? First rhythm afib, flutter or svt Will hold further  fluids and abx, but continue remainder of work up and ct scan of chest and abdomen Patient continues to complain of some abdominal pain.  He continues to endorse that symptoms began with chest pain.  DDX- hypotension and tachycardia- Hypotension tachycardia present with broad differential of sepsis, volume depletion, bleeding, arrhythmias, acute coronary syndrome and cardiac failure Sudden change in rhythm, suspect patient had a tachycardia either a flutter, A-fib, or SVT.  Blood pressure has rebounded. Patient has continued to have abdominal pain Will continue to obtain workup including CT 10:16 AM CT without acute findings but there is a moderate right pleural effusion with right basilar atelectasis Patient has remained hemodynamically stable and flu negative Labs consistent with patient's chronic renal failure no evidence of hyperkalemia Awaiting results of lactic acid and blood culture  Initial troponin elevated at 24 which is likely secondary to his rapid heart rate Patient will need to be observed on monitor with repeat troponins 1-tachycardia/hypotension- Patient on metoprolol 12.5 mg 2-dm- no evidence of dka, bs 133- appears stable       Final Clinical Impression(s) / ED Diagnoses Final diagnoses:  Hypotension, unspecified hypotension type  Tachycardia  Pleural effusion    Rx / DC Orders ED Discharge Orders     None         Margarita Grizzle, MD 11/25/22 1624

## 2022-11-25 NOTE — ED Provider Notes (Signed)
I was erroneously listed in code medical page for STAT CT as Ed provider, but I am not this patient's provider. Please see Dr Denny Levy ED note.   Terald Sleeper, MD 11/25/22 [phone]

## 2022-11-25 NOTE — Assessment & Plan Note (Signed)
Follows with [facility]. Treated surgically and doing well per patient report -dysphagia 2 diet -SLP eval

## 2022-11-25 NOTE — Assessment & Plan Note (Signed)
S/p internal fixation 09/09/32 -PT/OT eval and treat

## 2022-11-25 NOTE — Evaluation (Signed)
Clinical/Bedside Swallow Evaluation Patient Details  Name: [name] MRN: [id number] Date of Birth: June 06, 1956  Today's Date: 11/25/2022 Time: SLP Start Time (ACUTE ONLY): 1614 SLP Stop Time (ACUTE ONLY): 1625 SLP Time Calculation (min) (ACUTE ONLY): 11 min  Past Medical History:  Past Medical History:  Diagnosis Date   Anemia    Arthritis    hips shoulders   CAD (coronary artery disease) 03/03/2019   Cancer (HCC)    Esophageal cancer 2022   CHF (congestive heart failure) (HCC) 2020   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   Admitted 11/14-11/20/19-diuresed 10L   Depression 05/31/2021   Fatigue 10/14/2020   Hypertension    Myocardial infarction (HCC)    Sleep apnea    uses a bipap machine   Stroke (HCC) 12/14/2020   no last weakness or paralysis   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   Past Surgical History:  Past Surgical History:  Procedure Laterality Date   AV FISTULA PLACEMENT Left 12/21/2021   Procedure: LEFT ARM ARTERIOVENOUS (AV) FISTULA CREATION;  Surgeon: Larina Earthly, MD;  Location: [facility];  Service: Vascular;  Laterality: Left;   BIOPSY  06/27/2019   Procedure: BIOPSY;  Surgeon: Malissa Hippo, MD;  Location: [facility];  Service: Endoscopy;;  ascending colon polyp   BIOPSY  10/21/2020   Procedure: BIOPSY;  Surgeon: Malissa Hippo, MD;  [facility]: [facility];  Service: Endoscopy;;  esophageal,gastric polyp   BIOPSY  02/24/2021   Procedure: BIOPSY;  Surgeon: Malissa Hippo, MD;  [facility]: [facility];  Service: Endoscopy;;  distal and proximal esophageal biopsies    BIOPSY  05/30/2022   Procedure: BIOPSY;  Surgeon: Lanelle Bal, DO;  [facility];  Service: Endoscopy;;   CARDIAC SURGERY     CATARACT EXTRACTION     COLONOSCOPY N/A 06/27/2019   [name]:Diverticulosis in the entire examined colon. tubular adenoma in ascending  colon, 4mm tubular adenoma in prox sigmoid. external hemorrhoids   CORONARY ARTERY BYPASS GRAFT N/A 03/25/2019   Procedure: CORONARY ARTERY BYPASS GRAFTING (CABG) X  4 USING LEFT INTERNAL MAMMARY ARTERY AND RIGHT SAPHENOUS VEIN GRAFTS;  Surgeon: Corliss Skains, MD;  Location: [facility];  Service: Open Heart Surgery;  Laterality: N/A;   ESOPHAGEAL BRUSHING  03/08/2022   Procedure: ESOPHAGEAL BRUSHING;  Surgeon: Lanelle Bal, DO;  [facility]: [facility];  Service: Endoscopy;;   ESOPHAGOGASTRODUODENOSCOPY (EGD) WITH PROPOFOL N/A 10/21/2020   rehman:Normal hypopharynx.Normal proximal esophagus and mid esophagus.Esophageal mucosal changes consistent with long-segment Barrett's esophagus. (Focal low-grade dysplasia and atypia proximally) z line irregular 37 cm from incisors, 3 cm HH, single gastric polyp (fundic gland) normal duodenal bulb/second portion of duodenum, proximal margin of Barrett's at 34 cm   ESOPHAGOGASTRODUODENOSCOPY (EGD) WITH PROPOFOL N/A 02/24/2021   Procedure: ESOPHAGOGASTRODUODENOSCOPY (EGD) WITH PROPOFOL;  Surgeon: Malissa Hippo, MD;  [facility];  Service: Endoscopy;  Laterality: N/A;  1:35   ESOPHAGOGASTRODUODENOSCOPY (EGD) WITH PROPOFOL N/A 03/21/2021   Procedure: ESOPHAGOGASTRODUODENOSCOPY (EGD) WITH PROPOFOL;  Surgeon: Jeani Hawking, MD;  Location[facility];  Service: Endoscopy;  Laterality: N/A;   ESOPHAGOGASTRODUODENOSCOPY (EGD) WITH PROPOFOL N/A 10/23/2021   Procedure: ESOPHAGOGASTRODUODENOSCOPY (EGD) WITH PROPOFOL;  Surgeon: Sherrilyn Rist, MD;  Location: [facility] ENDOSCOPY;  Service: Gastroenterology;  Laterality: N/A;   ESOPHAGOGASTRODUODENOSCOPY (EGD) WITH PROPOFOL N/A 03/08/2022   Procedure: ESOPHAGOGASTRODUODENOSCOPY (EGD) WITH PROPOFOL;  Surgeon: Earnest Bailey  K, DO;  [facility];  Service: Endoscopy;  Laterality: N/A;   ESOPHAGOGASTRODUODENOSCOPY (EGD) WITH PROPOFOL N/A 05/27/2022   Procedure: ESOPHAGOGASTRODUODENOSCOPY (EGD) WITH  PROPOFOL;  Surgeon: Dolores Frame, MD;  [facility];  Service: Gastroenterology;  Laterality: N/A;   ESOPHAGOGASTRODUODENOSCOPY (EGD) WITH PROPOFOL N/A 05/30/2022   Procedure: ESOPHAGOGASTRODUODENOSCOPY (EGD) WITH PROPOFOL;  Surgeon: Lanelle Bal, DO;  [facility];  Service: Endoscopy;  Laterality: N/A;   EXCISION MORTON'S NEUROMA     EYE SURGERY Left    retina   INCISION AND DRAINAGE OF WOUND Right 06/28/2021   Procedure: IRRIGATION AND DEBRIDEMENT WOUND;  Surgeon: Allena Napoleon, MD;  Location: [facility];  Service: Plastics;  Laterality: Right;  1.5 hour   INSERTION OF DIALYSIS CATHETER Right 12/21/2021   Procedure: INSERTION OF TUNNELED DIALYSIS CATHETER;  Surgeon: Larina Earthly, MD;  Location: [facility];  Service: Vascular;  Laterality: Right;   INTRAMEDULLARY (IM) NAIL INTERTROCHANTERIC Right 09/10/2022   Procedure: INTRAMEDULLARY (IM) NAIL INTERTROCHANTERIC;  Surgeon: Joen Laura, MD;  Location: [facility];  Service: Orthopedics;  Laterality: Right;   LEFT HEART CATH AND CORS/GRAFTS ANGIOGRAPHY N/A 02/01/2022   Procedure: LEFT HEART CATH AND CORS/GRAFTS ANGIOGRAPHY;  Surgeon: Swazil[place], Peter M, MD;  Location: [facility] INVASIVE CV LAB;  Service: Cardiovascular;  Laterality: N/A;   POLYPECTOMY  06/27/2019   Procedure: POLYPECTOMY;  Surgeon: Malissa Hippo, MD;  [facility];  Service: Endoscopy;;  proximal sigmoid colon   RIGHT/LEFT HEART CATH AND CORONARY ANGIOGRAPHY N/A 03/12/2019   Procedure: RIGHT/LEFT HEART CATH AND CORONARY ANGIOGRAPHY;  Surgeon: Corky Crafts, MD;  Location: [facility] INVASIVE CV LAB;  Service: Cardiovascular;  Laterality: N/A;   SHOULDER ARTHROSCOPY WITH ROTATOR CUFF REPAIR AND SUBACROMIAL DECOMPRESSION Right 08/26/2020   Procedure: RIGHT SHOULDER MINI OPEN ROTATOR CUFF REPAIR AND SUBACROMIAL DECOMPRESSION WITH PATCH GRAFT;  Surgeon: Jene Every, MD;  Location: [facility];  Service: Orthopedics;  Laterality: Right;  90  MINS GENERAL WITH BLOCK   SKIN SPLIT GRAFT Right 06/28/2021   Procedure: SKIN GRAFT SPLIT THICKNESS;  Surgeon: Allena Napoleon, MD;  Location: [facility];  Service: Plastics;  Laterality: Right;   TEE WITHOUT CARDIOVERSION N/A 03/25/2019   Procedure: TRANSESOPHAGEAL ECHOCARDIOGRAM (TEE);  Surgeon: Corliss Skains, MD;  Location[facility] OR;  Service: Open Heart Surgery;  Laterality: N/A;   HPI:  [name] is a 67 y.o. male who presented with chest pain, hypotension, and tachycardia. CT chest 6/14: Moderate right pleural effusion with right basilar atelectasis. A dysphagia 2 diet with thin liquids was initiated PMH: A-fib and SVT, ESRD on HD MWF, CAD s/p CABG, HTN, T2DM, combined CHF, esophageal adenocarcinoma, GI bleed, chronic respiratory failure (on 3L baseline O2). [name] seen by SLP 03/04/22: [name] reported regurgitation of PO and most recently some emesis containing blood and c/o globus sensation, but no s/s of aspiration noted. A dysphagia 1 diet with thin liquids was recommended and an esophagram. EGD 03/08/22: medium sized hiatal hernia, moderately severe candida esophagitis with no bleeding. [name] advised by GI to initiate a mechanical soft diet.    Assessment / Plan / Recommendation  Clinical Impression  [name] was seen for bedside swallow evaluation. [name] has a history of esophageal dysphagia and he agreed that he was cleared for a mechanical soft diet last year by GI, but stated that he consumes a dysphagia 2 diet with thin liquids since this has been easier. Per the [name], he has been able to tolerate some  more advanced solids such as cookies (noted at bedside) and fruit cocktail once he masticates them well. Oral mechanism exam was [facility] and his natural dentition was adequate. He exhibited a delayed throat clear with a single larger bolus of thin liquids, but this was not replicated and no other symptoms of oropharyngeal dysphagia. Upon further inquiry at the end of the evaluation, [name] stated that he does occasionally cough  with thin liquids if he takes a large sip; however, he reiterated that this was occasional and stated that he would not be interested in instrumental assessment. [name]'s current diet of dysphagia 2 solids and thin liquids may be continued with allowance of other more advanced solids up to mechanical soft (per GI's most recent recommendation) if [name] wishes. Further acute skilled SLP services are not clinically indicated at this time. SLP Visit Diagnosis: Dysphagia, unspecified (R13.10)    Aspiration Risk  Mild aspiration risk    Diet Recommendation Dysphagia 2 (Fine chop);Thin liquid (with advancement per GI recommendation and/or [name] preference)   Liquid Administration via: Cup;Straw Medication Administration: Whole meds with liquid Supervision: Patient able to self feed Compensations: Slow rate;Small sips/bites Postural Changes: Seated upright at 90 degrees    Other  Recommendations Oral Care Recommendations: Oral care BID    Recommendations for follow up therapy are one component of a multi-disciplinary discharge planning process, led by the attending physician.  Recommendations may be updated based on patient status, additional functional criteria and insurance authorization.  Follow up Recommendations No SLP follow up      Assistance Recommended at Discharge    Functional Status Assessment    Frequency and Duration            Prognosis        Swallow Study   General Date of Onset: 03/04/22 HPI: [name] is a 67 y.o. male who presented with chest pain, hypotension, and tachycardia. CT chest 6/14: Moderate right pleural effusion with right basilar atelectasis. A dysphagia 2 diet with thin liquids was initiated PMH: A-fib and SVT, ESRD on HD MWF, CAD s/p CABG, HTN, T2DM, combined CHF, esophageal adenocarcinoma, GI bleed, chronic respiratory failure (on 3L baseline O2). [name] seen by SLP 03/04/22: [name] reported regurgitation of PO and most recently some emesis containing blood and c/o globus  sensation, but no s/s of aspiration noted. A dysphagia 1 diet with thin liquids was recommended and an esophagram. EGD 03/08/22: medium sized hiatal hernia, moderately severe candida esophagitis with no bleeding. [name] advised by GI to initiate a mechanical soft diet. Type of Study: Bedside Swallow Evaluation Previous Swallow Assessment: See HPI Diet Prior to this Study: Dysphagia 2 (finely chopped);Thin liquids (Level 0) Temperature Spikes Noted: No Respiratory Status: Nasal cannula History of Recent Intubation: No Behavior/Cognition: Alert;Cooperative;Pleasant mood Oral Cavity Assessment: Within Functional Limits Oral Care Completed by SLP: No Oral Cavity - Dentition: Adequate natural dentition Vision: Functional for self-feeding Self-Feeding Abilities: Able to feed self Patient Positioning: Upright in bed;Postural control adequate for testing Baseline Vocal Quality: Normal Volitional Swallow: Able to elicit    Oral/Motor/Sensory Function Overall Oral Motor/Sensory Function: Within functional limits   Ice Chips Ice chips: Not tested   Thin Liquid Thin Liquid: Within functional limits Presentation: Self Fed;Straw Other Comments:  (delayed throat clear once)    Nectar Thick Nectar Thick Liquid: Not tested   Honey Thick Honey Thick Liquid: Not tested   Puree Puree: Within functional limits Presentation: Spoon   Solid     Solid: Within functional limits Presentation: Self  Fed;Spoon     [name], MS, CCC-SLP Neuro Diagnostic Specialist  Acute Rehabilitation Services Office number: [phone]  Scheryl Marten 11/25/2022,4:55 PM

## 2022-11-25 NOTE — Assessment & Plan Note (Addendum)
Mildly elevated hs-trop of 24.  Likely demand in the setting of his tachycardia and hypotension. He also is ESRD. This is stable from his previous values. -Follow-up results of 2nd troponin

## 2022-11-25 NOTE — Consult Note (Addendum)
Cardiology Consultation   Patient ID: [name] MRN: [id number]; DOB: 11/19/1955  Admit date: 11/25/2022 Date of Consult: 11/25/2022  PCP:  Babs Sciara, MD    [facility] Providers Cardiologist:  Chilton Si, MD        Patient Profile:   [name] is a 67 y.o. 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  Patient is being seen 11/25/2022 for the evaluation of SVT vs afib at the request of Dr. Anner Crete.  History of Present Illness:   [name] presented to the emergency department with EMS this morning. Patient awoke this morning not feeling well. He notified staff and was found to have low blood pressure and tachycardia. Patient reports concurrent chest tightness, dyspnea, and feeling lightheaded/dizzy. This prompted EMS activation. Per EMS run sheet, they arrived found patient "pale and clammy."  Over review of EMS vitals show BP as low as 66/47 with heart rate up to 156 bpm.  EMS reports minimal BP response to fluid bolus.  Upon arrival to the ED, patient found with a heart rate of 150 and a blood pressure of 70/30 mmHg.  He was given an IV bolus and shortly thereafter found with a heart rate of 89, hypertensive with systolic BP 140. On my exam today, patient denies any symptoms such as those experienced this morning since his previous ED visit for tachycardia in April. He has progressed with his hip rehab and denies chest pain, dyspnea, orthopnea, edema, dizziness/lightheadedness aside from this morning's symptoms.  Patient was previously seen by our inpatient team at [facility] on 3/7 and 3/8.  Cardiology was consulted because patient presented with left side chest pain, sharp rated 10 out of 10 with shortness of breath and  palpitations.  EMS brought patient to the emergency department.  Patient was noted to have runs of SVT, transient hypotension with blood pressures into the 60s.  He was initially started on low-dose Levophed but this was stopped shortly after starting.  Patient was placed on amiodarone infusion and had no recurrence of SVT. At discharge, he was transitioned to oral amiodarone, 200 mg twice daily for 3 weeks followed by amiodarone 200 mg daily.  He was also continued on metoprolol succinate 50 mg daily.   Patient was also seen by myself and Dr. Shari Prows in the ED on 09/22/2022 with very similar presentation to above.  Patient awoke that morning with shortness of breath and rapid heart rate.  Patient was found to have low blood pressure and heart rate between 120 and 170 by EMS.  Patient eventually cardioverted spontaneously in the ED with IV metoprolol.  A review of patient's tachyarrhythmia showed atrial fibrillation with RVR.  Patient had recovery of blood pressure with normal heart rate.  We recommended no anticoagulation given esophageal cancer and anemia.   Past Medical History:  Diagnosis Date   Anemia    Arthritis    hips shoulders   CAD (coronary artery disease) 03/03/2019   Cancer (HCC)    Esophageal cancer 2022   CHF (congestive heart failure) (HCC) 2020   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   Admitted 11/14-11/20/19-diuresed 10L   Depression 05/31/2021   Fatigue 10/14/2020   Hypertension    Myocardial infarction (HCC)  Sleep apnea    uses a bipap machine   Stroke (HCC) 12/14/2020   no last weakness or paralysis   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    Past Surgical History:  Procedure Laterality Date   AV FISTULA PLACEMENT Left 12/21/2021   Procedure: LEFT ARM ARTERIOVENOUS (AV) FISTULA CREATION;  Surgeon: Larina Earthly, MD;  Location: [facility];  Service: Vascular;   Laterality: Left;   BIOPSY  06/27/2019   Procedure: BIOPSY;  Surgeon: Malissa Hippo, MD;  [facility]: [facility];  Service: Endoscopy;;  ascending colon polyp   BIOPSY  10/21/2020   Procedure: BIOPSY;  Surgeon: Malissa Hippo, MD;  [facility]: [facility];  Service: Endoscopy;;  esophageal,gastric polyp   BIOPSY  02/24/2021   Procedure: BIOPSY;  Surgeon: Malissa Hippo, MD;  [facility]: [facility];  Service: Endoscopy;;  distal and proximal esophageal biopsies    BIOPSY  05/30/2022   Procedure: BIOPSY;  Surgeon: Lanelle Bal, DO;  [facility];  Service: Endoscopy;;   CARDIAC SURGERY     CATARACT EXTRACTION     COLONOSCOPY N/A 06/27/2019   [name]:Diverticulosis in the entire examined colon. tubular adenoma in ascending colon, 4mm tubular adenoma in prox sigmoid. external hemorrhoids   CORONARY ARTERY BYPASS GRAFT N/A 03/25/2019   Procedure: CORONARY ARTERY BYPASS GRAFTING (CABG) X  4 USING LEFT INTERNAL MAMMARY ARTERY AND RIGHT SAPHENOUS VEIN GRAFTS;  Surgeon: Corliss Skains, MD;  Location: [facility];  Service: Open Heart Surgery;  Laterality: N/A;   ESOPHAGEAL BRUSHING  03/08/2022   Procedure: ESOPHAGEAL BRUSHING;  Surgeon: Lanelle Bal, DO;  [facility]: [facility];  Service: Endoscopy;;   ESOPHAGOGASTRODUODENOSCOPY (EGD) WITH PROPOFOL N/A 10/21/2020   rehman:Normal hypopharynx.Normal proximal esophagus and mid esophagus.Esophageal mucosal changes consistent with long-segment Barrett's esophagus. (Focal low-grade dysplasia and atypia proximally) z line irregular 37 cm from incisors, 3 cm HH, single gastric polyp (fundic gland) normal duodenal bulb/second portion of duodenum, proximal margin of Barrett's at 34 cm   ESOPHAGOGASTRODUODENOSCOPY (EGD) WITH PROPOFOL N/A 02/24/2021   Procedure: ESOPHAGOGASTRODUODENOSCOPY (EGD) WITH PROPOFOL;  Surgeon: Malissa Hippo, MD;  [facility];  Service: Endoscopy;  Laterality: N/A;  1:35   ESOPHAGOGASTRODUODENOSCOPY  (EGD) WITH PROPOFOL N/A 03/21/2021   Procedure: ESOPHAGOGASTRODUODENOSCOPY (EGD) WITH PROPOFOL;  Surgeon: Jeani Hawking, MD;  Location: [facility] ENDOSCOPY;  Service: Endoscopy;  Laterality: N/A;   ESOPHAGOGASTRODUODENOSCOPY (EGD) WITH PROPOFOL N/A 10/23/2021   Procedure: ESOPHAGOGASTRODUODENOSCOPY (EGD) WITH PROPOFOL;  Surgeon: Sherrilyn Rist, MD;  Location: [facility] ENDOSCOPY;  Service: Gastroenterology;  Laterality: N/A;   ESOPHAGOGASTRODUODENOSCOPY (EGD) WITH PROPOFOL N/A 03/08/2022   Procedure: ESOPHAGOGASTRODUODENOSCOPY (EGD) WITH PROPOFOL;  Surgeon: Lanelle Bal, DO;  [facility];  Service: Endoscopy;  Laterality: N/A;   ESOPHAGOGASTRODUODENOSCOPY (EGD) WITH PROPOFOL N/A 05/27/2022   Procedure: ESOPHAGOGASTRODUODENOSCOPY (EGD) WITH PROPOFOL;  Surgeon: Dolores Frame, MD;  [facility];  Service: Gastroenterology;  Laterality: N/A;   ESOPHAGOGASTRODUODENOSCOPY (EGD) WITH PROPOFOL N/A 05/30/2022   Procedure: ESOPHAGOGASTRODUODENOSCOPY (EGD) WITH PROPOFOL;  Surgeon: Lanelle Bal, DO;  [facility];  Service: Endoscopy;  Laterality: N/A;   EXCISION MORTON'S NEUROMA     EYE SURGERY Left    retina   INCISION AND DRAINAGE OF WOUND Right 06/28/2021   Procedure: IRRIGATION AND DEBRIDEMENT WOUND;  Surgeon: Allena Napoleon, MD;  Location: [facility];  Service: Plastics;  Laterality: Right;  1.5  hour   INSERTION OF DIALYSIS CATHETER Right 12/21/2021   Procedure: INSERTION OF TUNNELED DIALYSIS CATHETER;  Surgeon: Larina Earthly, MD;  Location: [facility];  Service: Vascular;  Laterality: Right;   INTRAMEDULLARY (IM) NAIL INTERTROCHANTERIC Right 09/10/2022   Procedure: INTRAMEDULLARY (IM) NAIL INTERTROCHANTERIC;  Surgeon: Joen Laura, MD;  Location: [facility];  Service: Orthopedics;  Laterality: Right;   LEFT HEART CATH AND CORS/GRAFTS ANGIOGRAPHY N/A 02/01/2022   Procedure: LEFT HEART CATH AND CORS/GRAFTS ANGIOGRAPHY;  Surgeon: Swaziland, Peter M, MD;  Location: [facility]  INVASIVE CV LAB;  Service: Cardiovascular;  Laterality: N/A;   POLYPECTOMY  06/27/2019   Procedure: POLYPECTOMY;  Surgeon: Malissa Hippo, MD;  [facility];  Service: Endoscopy;;  proximal sigmoid colon   RIGHT/LEFT HEART CATH AND CORONARY ANGIOGRAPHY N/A 03/12/2019   Procedure: RIGHT/LEFT HEART CATH AND CORONARY ANGIOGRAPHY;  Surgeon: Corky Crafts, MD;  Location: [facility] INVASIVE CV LAB;  Service: Cardiovascular;  Laterality: N/A;   SHOULDER ARTHROSCOPY WITH ROTATOR CUFF REPAIR AND SUBACROMIAL DECOMPRESSION Right 08/26/2020   Procedure: RIGHT SHOULDER MINI OPEN ROTATOR CUFF REPAIR AND SUBACROMIAL DECOMPRESSION WITH PATCH GRAFT;  Surgeon: Jene Every, MD;  Location: [facility];  Service: Orthopedics;  Laterality: Right;  90 MINS GENERAL WITH BLOCK   SKIN SPLIT GRAFT Right 06/28/2021   Procedure: SKIN GRAFT SPLIT THICKNESS;  Surgeon: Allena Napoleon, MD;  Location: [facility];  Service: Plastics;  Laterality: Right;   TEE WITHOUT CARDIOVERSION N/A 03/25/2019   Procedure: TRANSESOPHAGEAL ECHOCARDIOGRAM (TEE);  Surgeon: Corliss Skains, MD;  Location: [facility] OR;  Service: Open Heart Surgery;  Laterality: N/A;     Home Medications:  Prior to Admission medications   Medication Sig Start Date End Date Taking? Authorizing Provider  acetaminophen (TYLENOL) 325 MG tablet Take 2 tablets (650 mg total) by mouth every 6 (six) hours as needed for mild pain, moderate pain or fever. 08/02/21   Drema Dallas, MD  albuterol (VENTOLIN HFA) 108 (90 Base) MCG/ACT inhaler INHALE TWO PUFFS BY MOUTH EVERY 6 HOURS AS NEEDED Patient taking differently: Inhale 2 puffs into the lungs every 6 (six) hours as needed for wheezing or shortness of breath. 08/11/22   Babs Sciara, MD  amiodarone (PACERONE) 200 MG tablet Take 1 tablet (200 mg total) by mouth 2 (two) times daily. Through 09/08/22.  Then 1 tab (200 mg) once daily starting 09/09/22 09/17/22   Jerald Kief, MD  bisacodyl (DULCOLAX) 10 MG suppository  Place 1 suppository (10 mg total) rectally daily as needed for moderate constipation. 09/17/22   Jerald Kief, MD  calcium acetate (PHOSLO) 667 MG capsule Take 667 mg by mouth in the morning, at noon, and at bedtime.    [provider]  doxazosin (CARDURA) 2 MG tablet TAKE 1 TABLET BY MOUTH DAILY 07/29/22   Babs Sciara, MD  enoxaparin (LOVENOX) 30 MG/0.3ML injection Inject 0.3 mLs (30 mg total) into the skin daily for 21 days. 09/17/22 10/08/22  Jerald Kief, MD  EPINEPHrine 0.3 mg/0.3 mL IJ SOAJ injection Inject 0.3 mg into the muscle as needed for anaphylaxis. 01/25/21   Babs Sciara, MD  Evolocumab (REPATHA SURECLICK) 140 MG/ML SOAJ Inject 140 mg into the skin every 14 (fourteen) days. 06/29/22   Alver Sorrow, NP  hydrALAZINE (APRESOLINE) 50 MG tablet Take 1 tablet (50 mg total) by mouth 3 (three) times daily. 06/23/22   Shon Hale, MD  LANTUS 100 UNIT/ML injection 8 mLs daily. Sliding scale 04/07/22  [provider]  methocarbamol (ROBAXIN) 500 MG tablet Take 1 tablet (500 mg total) by mouth every 8 (eight) hours as needed for muscle spasms. 09/17/22   Jerald Kief, MD  metoprolol tartrate (LOPRESSOR) 25 MG tablet Take 1 tablet (25 mg total) by mouth 2 (two) times daily. In addition you can take a 3rd dose of metoprolol as needed for heart rate > 120 09/22/22   Gwyneth Sprout, MD  nitroGLYCERIN (NITROSTAT) 0.4 MG SL tablet Place 1 tablet (0.4 mg total) under the tongue every 5 (five) minutes as needed for chest pain. 09/14/21   Alver Sorrow, NP  NOVOLOG FLEXPEN 100 UNIT/ML FlexPen Sliding scale AC/HS: CBG 70 - 120: 0 units  CBG 121 - 150: 2 units  CBG 151 - 200: 3 units  CBG 201 - 250: 5 units  CBG 251 - 300: 8 units  CBG 301 - 350: 11 units  CBG 351 - 400: 15 units  CBG > 400: call MD 09/17/22   Jerald Kief, MD  ondansetron (ZOFRAN-ODT) 4 MG disintegrating tablet Take 1 tablet by mouth every 8 hours as needed for nausea and vomiting. 08/02/22    Alver Sorrow, NP  oxyCODONE (OXY IR/ROXICODONE) 5 MG immediate release tablet Take 1-2 tablets (5-10 mg total) by mouth every 4 (four) hours as needed for moderate pain or severe pain. 09/17/22   Jerald Kief, MD  pantoprazole (PROTONIX) 40 MG tablet Take 1 tablet (40 mg total) by mouth 2 (two) times daily. 05/30/22   Catarina Hartshorn, MD  polyethylene glycol (MIRALAX) 17 g packet Take 17 g by mouth 2 (two) times daily. 09/17/22   Jerald Kief, MD  sertraline (ZOLOFT) 100 MG tablet TAKE 1 TABLET BY MOUTH DAILY 07/29/22   Babs Sciara, MD    Inpatient Medications: Scheduled Meds:  [START ON 11/26/2022] amiodarone  200 mg Oral Daily   heparin  5,000 Units Subcutaneous Q8H   metoprolol tartrate  25 mg Oral BID   Continuous Infusions:  PRN Meds:   Allergies:    Allergies  Allergen Reactions   Bee Venom Anaphylaxis   Atorvastatin Other (See Comments)    myalgia   Diltiazem Itching   Lopressor [Metoprolol]     NIGHTMARES. Pt tolerating this now 11/25/22   Reglan [Metoclopramide] Other (See Comments)    Suicidal    Rosuvastatin Other (See Comments)    myalgias   Valsartan Itching    Social History:   Social History   Socioeconomic History   Marital status: Single    Spouse name: Not on file   Number of children: 0   Years of education: 12   Highest education level: 12th grade  Occupational History   Not on file  Tobacco Use   Smoking status: Former    Types: Cigarettes    Quit date: 07/06/1988    Years since quitting: 34.4    Passive exposure: Past   Smokeless tobacco: Never  Vaping Use   Vaping Use: Never used  Substance and Sexual Activity   Alcohol use: Not Currently    Alcohol/week: 2.0 standard drinks of alcohol    Types: 2 Glasses of wine per week    Comment: socially   Drug use: No   Sexual activity: Not Currently    Partners: Female  Other Topics Concern   Not on file  Social History Narrative   Not on file   Social Determinants of Health    Financial Resource Strain: Low Risk  (  11/16/2022)   Overall Financial Resource Strain (CARDIA)    Difficulty of Paying Living Expenses: Not hard at all  Food Insecurity: No Food Insecurity (11/16/2022)   Hunger Vital Sign    Worried About Running Out of Food in the Last Year: Never true    Ran Out of Food in the Last Year: Never true  Transportation Needs: No Transportation Needs (11/16/2022)   PRAPARE - Administrator, Civil Service (Medical): No    Lack of Transportation (Non-Medical): No  Recent Concern: Transportation Needs - Unmet Transportation Needs (09/09/2022)   PRAPARE - Administrator, Civil Service (Medical): Yes    Lack of Transportation (Non-Medical): No  Physical Activity: Sufficiently Active (11/16/2022)   Exercise Vital Sign    Days of Exercise per Week: 5 days    Minutes of Exercise per Session: 50 min  Stress: No Stress Concern Present (11/16/2022)   [employer] of Occupational Health - Occupational Stress Questionnaire    Feeling of Stress : Only a little  Social Connections: Unknown (11/16/2022)   Social Connection and Isolation Panel [NHANES]    Frequency of Communication with Friends and Family: More than three times a week    Frequency of Social Gatherings with Friends and Family: More than three times a week    Attends Religious Services: More than 4 times per year    Active Member of [employer] or Organizations: Yes    Attends Engineer, structural: More than 4 times per year    Marital Status: Not on file  Intimate Partner Violence: Not At Risk (11/16/2022)   Humiliation, Afraid, Rape, and Kick questionnaire    Fear of Current or Ex-Partner: No    Emotionally Abused: No    Physically Abused: No    Sexually Abused: No    Family History:    Family History  Problem Relation Age of Onset   Colon cancer Mother    Heart Problems Father    Diabetes Father    Valvular heart disease Father    Sleep apnea Neg Hx    Stroke Neg Hx       ROS:  Please see the history of present illness.   All other ROS reviewed and negative.     Physical Exam/Data:   Vitals:   11/25/22 1130 11/25/22 1145 11/25/22 1215 11/25/22 1230  BP:      Pulse: 77 81 84 85  Resp: (!) 9 14 18 17   Temp:      TempSrc:      SpO2: 97% 100% 100% 100%  Weight:       No intake or output data in the 24 hours ending 11/25/22 1321    11/25/2022    8:13 AM 09/17/2022    1:47 PM 09/15/2022    2:32 PM  Last 3 Weights  Weight (lbs) 187 lb 9.8 oz 185 lb 6.5 oz 187 lb 6.3 oz  Weight (kg) 85.1 kg 84.1 kg 85 kg     Body mass index is 26.92 kg/m.  General:  Well nourished, well developed, in no acute distress HEENT: normal Neck: no JVD Vascular: No carotid bruits; Distal pulses 2+ bilaterally Cardiac:  normal S1, S2; RRR; systolic crescendo murmur at LLSB and apex. Lungs:  clear to auscultation bilaterally, no wheezing, rhonchi or rales  Abd: soft, nontender, no hepatomegaly  Ext: no edema Musculoskeletal:  No deformities, BUE and BLE strength normal and equal Skin: warm and dry  Neuro:  CNs 2-12 intact,  no focal abnormalities noted Psych:  Normal affect   EKG:  The EKG was personally reviewed and demonstrates:  Initial ECG concerning for 2:1 atrial flutter vs SVT (possibly an AVRT). Telemetry:  Telemetry was personally reviewed and demonstrates:  rapid, regular rhythm fixed at 151 bpm until spontaneous conversion to NSR while in the ED. Sinus rates 70s-80s  Relevant CV Studies:  08/18/22 TTE  IMPRESSIONS     1. Left ventricular ejection fraction, by estimation, is 50%. The left  ventricle has low normal function. The left ventricle has no regional wall  motion abnormalities. There is moderate left ventricular hypertrophy. Left  ventricular diastolic parameters   are consistent with Grade II diastolic dysfunction (pseudonormalization).  Elevated left atrial pressure.   2. Right ventricular systolic function is normal. The right ventricular   size is mildly enlarged. There is severely elevated pulmonary artery  systolic pressure.   3. Left atrial size was severely dilated.   4. Right atrial size was severely dilated.   5. The mitral valve is abnormal. Trivial mitral valve regurgitation.   6. The tricuspid valve is abnormal.   7. The aortic valve has an indeterminant number of cusps. Aortic valve  regurgitation is not visualized. No aortic stenosis is present.   8. The inferior vena cava is dilated in size with <50% respiratory  variability, suggesting right atrial pressure of 15 mmHg.   FINDINGS   Left Ventricle: Left ventricular ejection fraction, by estimation, is  50%. The left ventricle has low normal function. The left ventricle has no  regional wall motion abnormalities. Definity contrast agent was given IV  to delineate the left ventricular  endocardial borders. The left ventricular internal cavity size was normal  in size. There is moderate left ventricular hypertrophy. Left ventricular  diastolic parameters are consistent with Grade II diastolic dysfunction  (pseudonormalization). Elevated  left atrial pressure.   Right Ventricle: The right ventricular size is mildly enlarged. Right  vetricular wall thickness was not well visualized. Right ventricular  systolic function is normal. There is severely elevated pulmonary artery  systolic pressure. The tricuspid  regurgitant velocity is 3.61 m/s, and with an assumed right atrial  pressure of 15 mmHg, the estimated right ventricular systolic pressure is  67.1 mmHg.   Left Atrium: Left atrial size was severely dilated.   Right Atrium: Right atrial size was severely dilated.   Pericardium: There is no evidence of pericardial effusion.   Mitral Valve: The mitral valve is abnormal. There is mild thickening of  the mitral valve leaflet(s). There is mild calcification of the mitral  valve leaflet(s). Mild mitral annular calcification. Trivial mitral valve   regurgitation. MV peak gradient, 6.9  mmHg. The mean mitral valve gradient is 2.0 mmHg.   Tricuspid Valve: The tricuspid valve is abnormal. Tricuspid valve  regurgitation is mild . No evidence of tricuspid stenosis.   Aortic Valve: The aortic valve has an indeterminant number of cusps.  Aortic valve regurgitation is not visualized. No aortic stenosis is  present. Aortic valve mean gradient measures 4.7 mmHg. Aortic valve peak  gradient measures 10.8 mmHg. Aortic valve  area, by VTI measures 2.29 cm.   Pulmonic Valve: The pulmonic valve was not well visualized. Pulmonic valve  regurgitation is not visualized. No evidence of pulmonic stenosis.   Aorta: The aortic root is normal in size and structure.   Venous: The inferior vena cava is dilated in size with less than 50%  respiratory variability, suggesting right atrial pressure of 15  mmHg.   IAS/Shunts: No atrial level shunt detected by color flow Doppler.   Laboratory Data:  High Sensitivity Troponin:   Recent Labs  Lab 11/25/22 0817 11/25/22 1104  TROPONINIHS 24* 155*     Chemistry Recent Labs  Lab 11/25/22 0817 11/25/22 0846 11/25/22 0853  NA 137 136 138  K 4.0 4.2 3.5  CL 98 101  --   CO2 24  --   --   GLUCOSE 128* 133*  --   BUN 35* 37*  --   CREATININE 4.39* 4.60*  --   CALCIUM 8.6*  --   --   GFRNONAA 14*  --   --   ANIONGAP 15  --   --     Recent Labs  Lab 11/25/22 0817  PROT 6.5  ALBUMIN 3.1*  AST 12*  ALT 8  ALKPHOS 96  BILITOT 0.8   Lipids No results for input(s): "CHOL", "TRIG", "HDL", "LABVLDL", "LDLCALC", "CHOLHDL" in the last 168 hours.  Hematology Recent Labs  Lab 11/25/22 0817 11/25/22 0846 11/25/22 0853  WBC 8.5  --   --   RBC 4.18*  --   --   HGB 10.7* 11.9* 10.5*  HCT 35.9* 35.0* 31.0*  MCV 85.9  --   --   MCH 25.6*  --   --   MCHC 29.8*  --   --   RDW 16.9*  --   --   PLT 290  --   --    Thyroid No results for input(s): "TSH", "FREET4" in the last 168 hours.  BNPNo  results for input(s): "BNP", "PROBNP" in the last 168 hours.  DDimer No results for input(s): "DDIMER" in the last 168 hours.   Radiology/Studies:  CT Angio Chest/Abd/Pel for Dissection W and/or Wo Contrast  Result Date: 11/25/2022 CLINICAL DATA:  Concern for thoracic aortic dissection.  Chest pain. EXAM: CT ANGIOGRAPHY CHEST, ABDOMEN AND PELVIS TECHNIQUE: Non-contrast CT of the chest was initially obtained. Multidetector CT imaging through the chest, abdomen and pelvis was performed using the standard protocol during bolus administration of intravenous contrast. Multiplanar reconstructed images and MIPs were obtained and reviewed to evaluate the vascular anatomy. RADIATION DOSE REDUCTION: This exam was performed according to the departmental dose-optimization program which includes automated exposure control, adjustment of the mA and/or kV according to patient size and/or use of iterative reconstruction technique. CONTRAST:  OMNIPAQUE IOHEXOL 350 MG/ML SOLN COMPARISON:  None Available. FINDINGS: CTA CHEST FINDINGS Cardiovascular: Noncontrast series demonstrates no intramural hematoma within the thoracic aorta. Contrast enhanced imaging demonstrates no dissection or aneurysm of the thoracic aorta. Post CABG anatomy. No pericardial fluid. No pulmonary embolism. Mediastinum/Nodes: No axillary or supraclavicular adenopathy. No mediastinal or hilar adenopathy. No pericardial fluid. Esophagus normal. Lungs/Pleura: Moderate layering RIGHT pleural effusion. Mild RIGHT basilar atelectasis. No pulmonary infarction identified. No pneumonia. Musculoskeletal: Fracture of the posterior LEFT sixth rib (image 52/8). Healed fractures of lower posterior RIGHT ribs on image 137. Review of the MIP images confirms the above findings. CTA ABDOMEN AND PELVIS FINDINGS VASCULAR Aorta: Normal caliber aorta without aneurysm, dissection, vasculitis or significant stenosis. Celiac: Patent without evidence of aneurysm, dissection,  vasculitis or significant stenosis. SMA: Patent without evidence of aneurysm, dissection, vasculitis or significant stenosis. Renals: Both renal arteries are patent without evidence of aneurysm, dissection, vasculitis, fibromuscular dysplasia or significant stenosis. IMA: Patent without evidence of aneurysm, dissection, vasculitis or significant stenosis. Inflow: Patent without evidence of aneurysm, dissection, vasculitis or significant stenosis. Veins: No obvious venous abnormality within  the limitations of this arterial phase study. Review of the MIP images confirms the above findings. NON-VASCULAR Hepatobiliary: No focal hepatic lesion. Normal gallbladder. No biliary duct dilatation. Common bile duct is normal. Pancreas: Pancreas is normal. No ductal dilatation. No pancreatic inflammation. Spleen: Normal spleen Adrenals/urinary tract: Adrenal glands and kidneys are normal. Bilateral simple fluid attenuation Bosniak 1 renal cysts. No follow-up recommended. The ureters and bladder normal. Stomach/Bowel: Stomach, small bowel, appendix, and cecum are normal. The colon and rectosigmoid colon are normal. Multiple diverticula of the descending colon and sigmoid colon without acute inflammation. Vascular/Lymphatic: Abdominal aorta is normal caliber. No periportal or retroperitoneal adenopathy. No pelvic adenopathy. Reproductive: Other: No free fluid. Musculoskeletal: No aggressive osseous lesion. Intramedullary nail fixation of the RIGHT femur with associated postprocedural inflammatory change. Review of the MIP images confirms the above findings. IMPRESSION: CHEST IMPRESSION: 1. No evidence of thoracic aortic dissection or aneurysm. 2. No pulmonary embolism. 3. Moderate RIGHT pleural effusion with RIGHT basilar atelectasis. 4. Fracture of the posterior LEFT sixth rib is age indeterminate. Fracture of the posterior RIGHT ribs are chronic. PELVIS IMPRESSION: 1. No evidence of abdominal aortic aneurysm or dissection. 2. No  acute findings in the abdomen pelvis. 3. LEFT colon diverticulosis without evidence diverticulitis. 4. RIGHT femur internal fixation Electronically Signed   By: Genevive Bi M.D.   On: 11/25/2022 09:44   DG Chest Port 1 View  Result Date: 11/25/2022 CLINICAL DATA:  Questionable sepsis EXAM: PORTABLE CHEST 1 VIEW COMPARISON:  Chest radiograph dated 09/22/2022 FINDINGS: Low lung volumes with bronchovascular crowding. Right basilar patchy opacities. Trace right pleural effusion. No pneumothorax. Enlarged cardiomediastinal silhouette. Median sternotomy wires are nondisplaced. IMPRESSION: 1. Right basilar patchy opacities, which may represent atelectasis, aspiration, or pneumonia. 2. Trace right pleural effusion. 3. Cardiomegaly. Electronically Signed   By: Agustin Cree M.D.   On: 11/25/2022 09:04     Assessment and Plan:   Tachycardia, paroxysmal SVT History of SVT Frequent PVCs  As above, patient brought to the ED by EMS after he awoke feeling poorly and was found to have tachycardia with notable hypotension.  This presentation nearly identical to when I saw patient in the ED on 09/22/2022.  A review of EMS ECG strips, ED ECG, and telemetry show a generally fixed tachycardic rate of 151 bpm.  Patient subsequently with spontaneous conversion to normal sinus rhythm and resulting resolution of all symptoms including resolution of chest tightness, shortness of breath, lightheadedness/dizziness.     Tachyarrhythmia this morning was SVT (short RP tachycardia) In April 2024 he had tachycardia as well  Admitted    EKG  shows sl irregularity to rate.   Review of iconsult note   from 09/22/22 (Dr Shari Prows)   HR initially 140s   With IV metoprolol the HR improved to 100s before converting to SR  (again, supporting afib)  Echo shows severe biatrial enlargement  Recommendations  I would recomm continuing amiodarone.   Since he is here in hospital can give IV (60 mg /hour; no bolus) If he has recurrent SVT (and  not afib) could consider ablation in the future I have discussed with EP Agree with use of metoprolol to control HR   (instead of hydralazine)  Chest tightness /elevated troponin  Hx of CAD, s/p CABG( LIMA to LAD, SVG to D2, OM and PLV in 2020).  Left heart catheterization in August 2023 showing patent grafts.   Symptoms completely resolved after conversion to normal sinus rhythm today. No other recent chest pain or  shortness of breath.  Troponin 24->155.  Very likely demand ischemia in the setting of tachycardia,  hypotension, CAD.  Pt with prolonged hypoperfusion/hypotension supported by initial lactic acid of 2.4 that improved to 1.2 upon patient converting to normal sinus rhythm, returning to normal blood pressure. No indication for heparin now Trend troponin to peak,  Combined systolic and diastolic CHF  Last echocardiogram from March 2024 with LVEF 50%, grade 2 diastolic dysfunction, severely elevated pulmonary artery pressures,  RVEF normal  severely dilated bilateral atria. Continue volume management with dialysis   Repeat limited echo   Hypertension  As with last ED visit, patient with notable hypotension exclusively in the setting of tachyarrhythmia.  Now normal to slightly hypertensive and normal sinus rhythm.  PTA med list shows hydralazine 50 mg 3 times daily. Would stop and use b blocker   Hyperlipidemia  Continue with Repatha.  ESRD    For dialysis tomorrow  GI    Pt with hx of esophageal CA   Appears to have been treated, no evid for recurrence on last EGD  EGD in Dec showed severe esophagitis, polyp and Barretts esophagus    EGD in Feb 2024   Gr C esophagitis.    Risk Assessment/Risk Scores:        New York Heart Association (NYHA) Functional Class NYHA Class I  CHA2DS2-VASc Score = 7   This indicates a 11.2% annual risk of stroke. The patient's score is based upon: CHF History: 1 HTN History: 1 Diabetes History: 1 Stroke History: 2 Vascular Disease  History: 1 Age Score: 1 Gender Score: 0    For questions or updates, please contact [facility] Please consult [email or web address] for contact info under    Signed, Perlie Gold, PA-C  11/25/2022 1:21 PM  Patient seen and examined   I have amended note above by E Williams Pt a 67 yo with CAD, HFmrEF, ESRD, esophageal CA (appears to be successfully treated)   Admitted with Chest pressure in setting of SVT     Review of records show he has had multiple episodes of SVT since the fall 2023   He was placed on amiodarone to help control rhythm at that time   He has been maintained on oral amio since with some breakthrough Note, in April 2024 he was hospitalized   It appears that he had atrial fibrillation (note says rhythm gradually  slowed to 100s before breakng)  Today presents with defiinte SVT that converted spontaneously  Currently pt is comfortable  Lungs     Mild rales at bases Cardiac exam   RRR   No S3  No signifcant murmus Abd   No hepatomegly Ext with tr edema  I have amended note above to reflect my recommendations    I would use IV amiodarone now to hasten small load (60 mg / hour   No bolus) Our service will reassess in am  (EP to see)   Will review options should SVT recur  Dietrich Pates MD

## 2022-11-25 NOTE — Assessment & Plan Note (Addendum)
Awaiting med rec from facility. Per chart is only on sliding scale at home -CBG monitoring with meals and at bedtime -Can add sliding scale if needed

## 2022-11-25 NOTE — Assessment & Plan Note (Signed)
Receives outpatient HD MWF. Still makes urine. Last dialysis 6/12.  -Nephrology consulted, appreciate recommendations

## 2022-11-25 NOTE — Consult Note (Signed)
Renal Service Consult Note [facility]  [name] 11/25/2022 [name], MD Requesting Physician: Dr. Miquel Dunn  Reason for Consult: ESRD pt w/ SVT and hypotension HPI: The patient is a 67 y.o. year-old w/ PMH as below who presented to ED this am from SNF due to hypotension and tachycardia. He was recently admitted for a hip fracture after a fall in early April 2024. This am pt felt severely weak and was having chest pains. In ED initially BP was 70/50s w/ HR 150s, then pt converted spontaneously and his BPs improved up to 130 /80.  CP and SOB resolved. CT showed no PE or dissection, R effusion. CXR showed atx, CM, small R effusion. Pt was admitted. We are asked to see for dialysis.   Pt seen in room.  Pt still weak from his hip fracture, rehab'ing at a facility. No SOB, cough or CP at this time. No abd pain. No leg swelling. No recent HD issues.   ROS - denies CP, no joint pain, no HA, no blurry vision, no rash, no diarrhea, no nausea/ vomiting, no dysuria, no difficulty voiding   Past Medical History  Past Medical History:  Diagnosis Date   Anemia    Arthritis    hips shoulders   CAD (coronary artery disease) 03/03/2019   Cancer (HCC)    Esophageal cancer 2022   CHF (congestive heart failure) (HCC) 2020   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   Admitted 11/14-11/20/19-diuresed 10L   Depression 05/31/2021   Fatigue 10/14/2020   Hypertension    Myocardial infarction [facility])    Sleep apnea    uses a bipap machine   Stroke (HCC) 12/14/2020   no last weakness or paralysis   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   Past Surgical History  Past Surgical History:  Procedure Laterality Date   AV FISTULA PLACEMENT Left 12/21/2021   Procedure: LEFT ARM ARTERIOVENOUS (AV) FISTULA CREATION;  Surgeon: Larina Earthly, MD;  Location: [facility];  Service: Vascular;   Laterality: Left;   BIOPSY  06/27/2019   Procedure: BIOPSY;  Surgeon: Malissa Hippo, MD;  [facility]: [facility];  Service: Endoscopy;;  ascending colon polyp   BIOPSY  10/21/2020   Procedure: BIOPSY;  Surgeon: Malissa Hippo, MD;  [facility]: [facility];  Service: Endoscopy;;  esophageal,gastric polyp   BIOPSY  02/24/2021   Procedure: BIOPSY;  Surgeon: Malissa Hippo, MD;  [facility]: [facility];  Service: Endoscopy;;  distal and proximal esophageal biopsies    BIOPSY  05/30/2022   Procedure: BIOPSY;  Surgeon: Lanelle Bal, DO;  [facility];  Service: Endoscopy;;   CARDIAC SURGERY     CATARACT EXTRACTION     COLONOSCOPY N/A 06/27/2019   [name]:Diverticulosis in the entire examined colon. tubular adenoma in ascending colon, 4mm tubular adenoma in prox sigmoid. external hemorrhoids   CORONARY ARTERY BYPASS GRAFT N/A 03/25/2019   Procedure: CORONARY ARTERY BYPASS GRAFTING (CABG) X  4 USING LEFT INTERNAL MAMMARY ARTERY AND RIGHT SAPHENOUS VEIN GRAFTS;  Surgeon: Corliss Skains, MD;  Location: [facility];  Service: Open Heart Surgery;  Laterality: N/A;   ESOPHAGEAL BRUSHING  03/08/2022   Procedure: ESOPHAGEAL BRUSHING;  Surgeon: Lanelle Bal, DO;  [facility]: [facility];  Service: Endoscopy;;   ESOPHAGOGASTRODUODENOSCOPY (EGD) WITH PROPOFOL N/A 10/21/2020   rehman:Normal hypopharynx.Normal proximal esophagus and mid esophagus.Esophageal mucosal changes  consistent with long-segment Barrett's esophagus. (Focal low-grade dysplasia and atypia proximally) z line irregular 37 cm from incisors, 3 cm HH, single gastric polyp (fundic gland) normal duodenal bulb/second portion of duodenum, proximal margin of Barrett's at 34 cm   ESOPHAGOGASTRODUODENOSCOPY (EGD) WITH PROPOFOL N/A 02/24/2021   Procedure: ESOPHAGOGASTRODUODENOSCOPY (EGD) WITH PROPOFOL;  Surgeon: Malissa Hippo, MD;  [facility];  Service: Endoscopy;  Laterality: N/A;  1:35   ESOPHAGOGASTRODUODENOSCOPY  (EGD) WITH PROPOFOL N/A 03/21/2021   Procedure: ESOPHAGOGASTRODUODENOSCOPY (EGD) WITH PROPOFOL;  Surgeon: Jeani Hawking, MD;  Location[facility] ENDOSCOPY;  Service: Endoscopy;  Laterality: N/A;   ESOPHAGOGASTRODUODENOSCOPY (EGD) WITH PROPOFOL N/A 10/23/2021   Procedure: ESOPHAGOGASTRODUODENOSCOPY (EGD) WITH PROPOFOL;  Surgeon: Sherrilyn Rist, MD;  Location: [facility] ENDOSCOPY;  Service: Gastroenterology;  Laterality: N/A;   ESOPHAGOGASTRODUODENOSCOPY (EGD) WITH PROPOFOL N/A 03/08/2022   Procedure: ESOPHAGOGASTRODUODENOSCOPY (EGD) WITH PROPOFOL;  Surgeon: Lanelle Bal, DO;  [facility];  Service: Endoscopy;  Laterality: N/A;   ESOPHAGOGASTRODUODENOSCOPY (EGD) WITH PROPOFOL N/A 05/27/2022   Procedure: ESOPHAGOGASTRODUODENOSCOPY (EGD) WITH PROPOFOL;  Surgeon: Dolores Frame, MD;  [facility];  Service: Gastroenterology;  Laterality: N/A;   ESOPHAGOGASTRODUODENOSCOPY (EGD) WITH PROPOFOL N/A 05/30/2022   Procedure: ESOPHAGOGASTRODUODENOSCOPY (EGD) WITH PROPOFOL;  Surgeon: Lanelle Bal, DO;  [facility];  Service: Endoscopy;  Laterality: N/A;   EXCISION MORTON'S NEUROMA     EYE SURGERY Left    retina   INCISION AND DRAINAGE OF WOUND Right 06/28/2021   Procedure: IRRIGATION AND DEBRIDEMENT WOUND;  Surgeon: Allena Napoleon, MD;  Location: [facility];  Service: Plastics;  Laterality: Right;  1.5 hour   INSERTION OF DIALYSIS CATHETER Right 12/21/2021   Procedure: INSERTION OF TUNNELED DIALYSIS CATHETER;  Surgeon: Larina Earthly, MD;  Location: [facility];  Service: Vascular;  Laterality: Right;   INTRAMEDULLARY (IM) NAIL INTERTROCHANTERIC Right 09/10/2022   Procedure: INTRAMEDULLARY (IM) NAIL INTERTROCHANTERIC;  Surgeon: Joen Laura, MD;  Location: [facility];  Service: Orthopedics;  Laterality: Right;   LEFT HEART CATH AND CORS/GRAFTS ANGIOGRAPHY N/A 02/01/2022   Procedure: LEFT HEART CATH AND CORS/GRAFTS ANGIOGRAPHY;  Surgeon: Swazil[place], Peter M, MD;  Location: [facility]  INVASIVE CV LAB;  Service: Cardiovascular;  Laterality: N/A;   POLYPECTOMY  06/27/2019   Procedure: POLYPECTOMY;  Surgeon: Malissa Hippo, MD;  [facility];  Service: Endoscopy;;  proximal sigmoid colon   RIGHT/LEFT HEART CATH AND CORONARY ANGIOGRAPHY N/A 03/12/2019   Procedure: RIGHT/LEFT HEART CATH AND CORONARY ANGIOGRAPHY;  Surgeon: Corky Crafts, MD;  Location: [facility] INVASIVE CV LAB;  Service: Cardiovascular;  Laterality: N/A;   SHOULDER ARTHROSCOPY WITH ROTATOR CUFF REPAIR AND SUBACROMIAL DECOMPRESSION Right 08/26/2020   Procedure: RIGHT SHOULDER MINI OPEN ROTATOR CUFF REPAIR AND SUBACROMIAL DECOMPRESSION WITH PATCH GRAFT;  Surgeon: Jene Every, MD;  Location: [facility];  Service: Orthopedics;  Laterality: Right;  90 MINS GENERAL WITH BLOCK   SKIN SPLIT GRAFT Right 06/28/2021   Procedure: SKIN GRAFT SPLIT THICKNESS;  Surgeon: Allena Napoleon, MD;  Location: [facility];  Service: Plastics;  Laterality: Right;   TEE WITHOUT CARDIOVERSION N/A 03/25/2019   Procedure: TRANSESOPHAGEAL ECHOCARDIOGRAM (TEE);  Surgeon: Corliss Skains, MD;  Location: [facility] OR;  Service: Open Heart Surgery;  Laterality: N/A;   Family History  Family History  Problem Relation Age of Onset   Colon cancer Mother    Heart Problems Father    Diabetes Father    Valvular heart disease Father    Sleep apnea  Neg Hx    Stroke Neg Hx    Social History  reports that he quit smoking about 34 years ago. His smoking use included cigarettes. He has been exposed to tobacco smoke. He has never used smokeless tobacco. He reports that he does not currently use alcohol after a past usage of about 2.0 standard drinks of alcohol per week. He reports that he does not use drugs. Allergies  Allergies  Allergen Reactions   Bee Venom Anaphylaxis   Atorvastatin Other (See Comments)    myalgia   Diltiazem Itching   Lopressor [Metoprolol]     NIGHTMARES. Pt tolerating this now 11/25/22   Reglan [Metoclopramide] Other (See  Comments)    Suicidal    Rosuvastatin Other (See Comments)    myalgias   Valsartan Itching   Home medications Prior to Admission medications   Medication Sig Start Date End Date Taking? Authorizing Provider  acetaminophen (TYLENOL) 325 MG tablet Take 2 tablets (650 mg total) by mouth every 6 (six) hours as needed for mild pain, moderate pain or fever. 08/02/21   Drema Dallas, MD  albuterol (VENTOLIN HFA) 108 (90 Base) MCG/ACT inhaler INHALE TWO PUFFS BY MOUTH EVERY 6 HOURS AS NEEDED Patient taking differently: Inhale 2 puffs into the lungs every 6 (six) hours as needed for wheezing or shortness of breath. 08/11/22   Babs Sciara, MD  amiodarone (PACERONE) 200 MG tablet Take 1 tablet (200 mg total) by mouth 2 (two) times daily. Through 09/08/22.  Then 1 tab (200 mg) once daily starting 09/09/22 09/17/22   Jerald Kief, MD  bisacodyl (DULCOLAX) 10 MG suppository Place 1 suppository (10 mg total) rectally daily as needed for moderate constipation. 09/17/22   Jerald Kief, MD  calcium acetate (PHOSLO) 667 MG capsule Take 667 mg by mouth in the morning, at noon, and at bedtime.    [provider]  doxazosin (CARDURA) 2 MG tablet TAKE 1 TABLET BY MOUTH DAILY 07/29/22   Babs Sciara, MD  enoxaparin (LOVENOX) 30 MG/0.3ML injection Inject 0.3 mLs (30 mg total) into the skin daily for 21 days. 09/17/22 10/08/22  Jerald Kief, MD  EPINEPHrine 0.3 mg/0.3 mL IJ SOAJ injection Inject 0.3 mg into the muscle as needed for anaphylaxis. 01/25/21   Babs Sciara, MD  Evolocumab (REPATHA SURECLICK) 140 MG/ML SOAJ Inject 140 mg into the skin every 14 (fourteen) days. 06/29/22   Alver Sorrow, NP  hydrALAZINE (APRESOLINE) 50 MG tablet Take 1 tablet (50 mg total) by mouth 3 (three) times daily. 06/23/22   Shon Hale, MD  LANTUS 100 UNIT/ML injection 8 mLs daily. Sliding scale 04/07/22   [provider]  methocarbamol (ROBAXIN) 500 MG tablet Take 1 tablet (500 mg total) by mouth every 8  (eight) hours as needed for muscle spasms. 09/17/22   Jerald Kief, MD  metoprolol tartrate (LOPRESSOR) 25 MG tablet Take 1 tablet (25 mg total) by mouth 2 (two) times daily. In addition you can take a 3rd dose of metoprolol as needed for heart rate > 120 09/22/22   Gwyneth Sprout, MD  nitroGLYCERIN (NITROSTAT) 0.4 MG SL tablet Place 1 tablet (0.4 mg total) under the tongue every 5 (five) minutes as needed for chest pain. 09/14/21   Walker, Storm Frisk, NP  NOVOLOG FLEXPEN 100 UNIT/ML FlexPen Sliding scale AC/HS: CBG 70 - 120: 0 units  CBG 121 - 150: 2 units  CBG 151 - 200: 3 units  CBG 201 - 250: 5  units  CBG 251 - 300: 8 units  CBG 301 - 350: 11 units  CBG 351 - 400: 15 units  CBG > 400: call MD 09/17/22   Jerald Kief, MD  ondansetron (ZOFRAN-ODT) 4 MG disintegrating tablet Take 1 tablet by mouth every 8 hours as needed for nausea and vomiting. 08/02/22   Alver Sorrow, NP  oxyCODONE (OXY IR/ROXICODONE) 5 MG immediate release tablet Take 1-2 tablets (5-10 mg total) by mouth every 4 (four) hours as needed for moderate pain or severe pain. 09/17/22   Jerald Kief, MD  pantoprazole (PROTONIX) 40 MG tablet Take 1 tablet (40 mg total) by mouth 2 (two) times daily. 05/30/22   Catarina Hartshorn, MD  polyethylene glycol (MIRALAX) 17 g packet Take 17 g by mouth 2 (two) times daily. 09/17/22   Jerald Kief, MD  sertraline (ZOLOFT) 100 MG tablet TAKE 1 TABLET BY MOUTH DAILY 07/29/22   Babs Sciara, MD     Vitals:   11/25/22 1130 11/25/22 1145 11/25/22 1215 11/25/22 1230  BP:      Pulse: 77 81 84 85  Resp: (!) 9 14 18 17   Temp:      TempSrc:      SpO2: 97% 100% 100% 100%  Weight:       Exam Gen alert, no distress No rash, cyanosis or gangrene Sclera anicteric, throat clear  No jvd or bruits Chest clear bilat to bases, no rales/ wheezing RRR no RG Abd soft ntnd no mass or ascites +bs GU normal male  MS no joint effusions or deformity Ext no LE or UE edema, no wounds or ulcers Neuro is  alert, Ox 3 , nf    LUA AVF+bruit     Home meds include - albuterol, phoslo 1 ac tid, cardure 2mg  every day, lovenox every day, repatha q 14d, hydralazine 50 tid, lantus/ novolog insulin, sl ntg prn, osy IR prn, protonix, zoloft, prns/ vits / supps    OP HD: MWF [place] 4h  400/600  81.2kg    LUA AVF   Heparin 5000 - no esa or vdra - last OP HD 6/12, post wt 80.5kg  - last Hb 11.2, last pth 33   CXR 6/14 - IMPRESSION: 1. Right basilar patchy opacities, which may represent atelectasis, aspiration, or pneumonia. 2. Trace right pleural effusion. 3. Cardiomegaly.    Assessment/ Plan: SVT - w/ CP and hypotension. Pt converted in ED spontaneously and BP's improved. Per primary team.  ESRD - on HD MWF. Due for HD today but will hold off until tomorrow 1st shift given #1.  BP/ volume - 1st BP 70s in the ED, now back up to 140/70 range w/ NSR. Is up 3-4kg by wts.  Will get standing wt to be sure prior to HD tomorrow.  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  MBD ckd - CCa in range,  DM2 - on insulin SP hip fx - sp ORIF on 09/10/22 Debility - getting rehab in SNF      Rob Xzaria Teo  MD [facility] 11/25/2022, 2:38 PM  Recent Labs  Lab 11/25/22 0817 11/25/22 0846 11/25/22 0853  HGB 10.7* 11.9* 10.5*  ALBUMIN 3.1*  --   --   CALCIUM 8.6*  --   --   CREATININE 4.39* 4.60*  --   K 4.0 4.2 3.5   Inpatient medications:  [START ON 11/26/2022] amiodarone  200 mg Oral Daily   heparin  5,000 Units Subcutaneous  Q8H   metoprolol tartrate  25 mg Oral BID

## 2022-11-25 NOTE — ED Notes (Signed)
ED TO INPATIENT HANDOFF REPORT  ED Nurse Name and Phone #: Precilla Purnell [phone]  S Name/Age/Gender [name] 67 y.o. male Room/Bed: 022C/022C  Code Status   Code Status: DNR  Home/SNF/Other Home Patient oriented to: self, place, time, and situation Is this baseline? Yes   Triage Complete: Triage complete  Chief Complaint Chest pain [R07.9]  Triage Note No notes on file   Allergies Allergies  Allergen Reactions   Bee Venom Anaphylaxis   Atorvastatin Other (See Comments)    myalgia   Diltiazem Itching   Lopressor [Metoprolol]     NIGHTMARES. Pt tolerating this now 11/25/22   Reglan [Metoclopramide] Other (See Comments)    Suicidal    Rosuvastatin Other (See Comments)    myalgias   Valsartan Itching    Level of Care/Admitting Diagnosis ED Disposition     ED Disposition  Admit   Condition  --   Comment  Hospital Area: [facility] [[id number]]  Level of Care: Telemetry Medical [104]  May place patient in observation at [facility] or [facility] if equivalent level of care is available:: No  Covid Evaluation: Confirmed COVID Negative  Diagnosis: Chest pain [161096]  Admitting Physician: Billey Co [[id number]]  Attending Physician: Billey Co [[id number]]          B Medical/Surgery History Past Medical History:  Diagnosis Date   Anemia    Arthritis    hips shoulders   CAD (coronary artery disease) 03/03/2019   Cancer (HCC)    Esophageal cancer 2022   CHF (congestive heart failure) (HCC) 2020   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   Admitted 11/14-11/20/19-diuresed 10L   Depression 05/31/2021   Fatigue 10/14/2020   Hypertension    Myocardial infarction [facility])    Sleep apnea    uses a bipap machine   Stroke (HCC) 12/14/2020   no last weakness or paralysis   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   Past Surgical History:   Procedure Laterality Date   AV FISTULA PLACEMENT Left 12/21/2021   Procedure: LEFT ARM ARTERIOVENOUS (AV) FISTULA CREATION;  Surgeon: Larina Earthly, MD;  Location: [facility] ORS;  Service: Vascular;  Laterality: Left;   BIOPSY  06/27/2019   Procedure: BIOPSY;  Surgeon: Malissa Hippo, MD;  Location: [facility];  Service: Endoscopy;;  ascending colon polyp   BIOPSY  10/21/2020   Procedure: BIOPSY;  Surgeon: Malissa Hippo, MD;  [facility];  Service: Endoscopy;;  esophageal,gastric polyp   BIOPSY  02/24/2021   Procedure: BIOPSY;  Surgeon: Malissa Hippo, MD;  [facility];  Service: Endoscopy;;  distal and proximal esophageal biopsies    BIOPSY  05/30/2022   Procedure: BIOPSY;  Surgeon: Lanelle Bal, DO;  [facility];  Service: Endoscopy;;   CARDIAC SURGERY     CATARACT EXTRACTION     COLONOSCOPY N/A 06/27/2019   [name]:Diverticulosis in the entire examined colon. tubular adenoma in ascending colon, 4mm tubular adenoma in prox sigmoid. external hemorrhoids   CORONARY ARTERY BYPASS GRAFT N/A 03/25/2019   Procedure: CORONARY ARTERY BYPASS GRAFTING (CABG) X  4 USING LEFT INTERNAL MAMMARY ARTERY AND RIGHT SAPHENOUS VEIN GRAFTS;  Surgeon: Corliss Skains, MD;  Location: [facility];  Service: Open Heart Surgery;  Laterality: N/A;   ESOPHAGEAL BRUSHING  03/08/2022   Procedure: ESOPHAGEAL BRUSHING;  Surgeon: Lanelle Bal, DO;  [facility];  Service: Endoscopy;;   ESOPHAGOGASTRODUODENOSCOPY (EGD) WITH PROPOFOL N/A 10/21/2020   rehman:Normal hypopharynx.Normal proximal esophagus and mid esophagus.Esophageal mucosal changes consistent with long-segment Barrett's esophagus. (Focal low-grade dysplasia and atypia proximally) z line irregular 37 cm from incisors, 3 cm HH, single gastric polyp (fundic gland) normal duodenal bulb/second portion of duodenum, proximal margin of Barrett's at 34 cm   ESOPHAGOGASTRODUODENOSCOPY (EGD) WITH PROPOFOL N/A 02/24/2021    Procedure: ESOPHAGOGASTRODUODENOSCOPY (EGD) WITH PROPOFOL;  Surgeon: Malissa Hippo, MD;  [facility];  Service: Endoscopy;  Laterality: N/A;  1:35   ESOPHAGOGASTRODUODENOSCOPY (EGD) WITH PROPOFOL N/A 03/21/2021   Procedure: ESOPHAGOGASTRODUODENOSCOPY (EGD) WITH PROPOFOL;  Surgeon: Jeani Hawking, MD;  Location: [facility] ENDOSCOPY;  Service: Endoscopy;  Laterality: N/A;   ESOPHAGOGASTRODUODENOSCOPY (EGD) WITH PROPOFOL N/A 10/23/2021   Procedure: ESOPHAGOGASTRODUODENOSCOPY (EGD) WITH PROPOFOL;  Surgeon: Sherrilyn Rist, MD;  Location: [facility] ENDOSCOPY;  Service: Gastroenterology;  Laterality: N/A;   ESOPHAGOGASTRODUODENOSCOPY (EGD) WITH PROPOFOL N/A 03/08/2022   Procedure: ESOPHAGOGASTRODUODENOSCOPY (EGD) WITH PROPOFOL;  Surgeon: Lanelle Bal, DO;  [facility];  Service: Endoscopy;  Laterality: N/A;   ESOPHAGOGASTRODUODENOSCOPY (EGD) WITH PROPOFOL N/A 05/27/2022   Procedure: ESOPHAGOGASTRODUODENOSCOPY (EGD) WITH PROPOFOL;  Surgeon: Dolores Frame, MD;  [facility];  Service: Gastroenterology;  Laterality: N/A;   ESOPHAGOGASTRODUODENOSCOPY (EGD) WITH PROPOFOL N/A 05/30/2022   Procedure: ESOPHAGOGASTRODUODENOSCOPY (EGD) WITH PROPOFOL;  Surgeon: Lanelle Bal, DO;  [facility];  Service: Endoscopy;  Laterality: N/A;   EXCISION MORTON'S NEUROMA     EYE SURGERY Left    retina   INCISION AND DRAINAGE OF WOUND Right 06/28/2021   Procedure: IRRIGATION AND DEBRIDEMENT WOUND;  Surgeon: Allena Napoleon, MD;  [facility] OR;  Service: Plastics;  Laterality: Right;  1.5 hour   INSERTION OF DIALYSIS CATHETER Right 12/21/2021   Procedure: INSERTION OF TUNNELED DIALYSIS CATHETER;  Surgeon: Larina Earthly, MD;  Location: [facility];  Service: Vascular;  Laterality: Right;   INTRAMEDULLARY (IM) NAIL INTERTROCHANTERIC Right 09/10/2022   Procedure: INTRAMEDULLARY (IM) NAIL INTERTROCHANTERIC;  Surgeon: Joen Laura, MD;  [facility];  Service:  Orthopedics;  Laterality: Right;   LEFT HEART CATH AND CORS/GRAFTS ANGIOGRAPHY N/A 02/01/2022   Procedure: LEFT HEART CATH AND CORS/GRAFTS ANGIOGRAPHY;  Surgeon: Swaziland, Peter M, MD;  Location: [facility] INVASIVE CV LAB;  Service: Cardiovascular;  Laterality: N/A;   POLYPECTOMY  06/27/2019   Procedure: POLYPECTOMY;  Surgeon: Malissa Hippo, MD;  [facility];  Service: Endoscopy;;  proximal sigmoid colon   RIGHT/LEFT HEART CATH AND CORONARY ANGIOGRAPHY N/A 03/12/2019   Procedure: RIGHT/LEFT HEART CATH AND CORONARY ANGIOGRAPHY;  Surgeon: Corky Crafts, MD;  Location: [facility] INVASIVE CV LAB;  Service: Cardiovascular;  Laterality: N/A;   SHOULDER ARTHROSCOPY WITH ROTATOR CUFF REPAIR AND SUBACROMIAL DECOMPRESSION Right 08/26/2020   Procedure: RIGHT SHOULDER MINI OPEN ROTATOR CUFF REPAIR AND SUBACROMIAL DECOMPRESSION WITH PATCH GRAFT;  Surgeon: Jene Every, MD;  Location: [facility];  Service: Orthopedics;  Laterality: Right;  90 MINS GENERAL WITH BLOCK   SKIN SPLIT GRAFT Right 06/28/2021   Procedure: SKIN GRAFT SPLIT THICKNESS;  Surgeon: Allena Napoleon, MD;  Location[facility];  Service: Plastics;  Laterality: Right;   TEE WITHOUT CARDIOVERSION N/A 03/25/2019   Procedure: TRANSESOPHAGEAL ECHOCARDIOGRAM (TEE);  Surgeon: Corliss Skains, MD;  Location: [facility] OR;  Service: Open Heart Surgery;  Laterality: N/A;     A IV Location/Drains/Wounds Patient Lines/Drains/Airways Status     Active  Line/Drains/Airways     Name Placement date Placement time Site Days   Peripheral IV 09/22/22 22 G Posterior;Right Wrist 09/22/22  1129  Wrist  64   Peripheral IV 09/22/22 18 G Anterior;Distal;Right;Upper Arm 09/22/22  1137  Arm  64   Peripheral IV 11/25/22 20 G Anterior;Left Hand 11/25/22  0818  Hand  less than 1   Fistula / Graft Left Forearm Arteriovenous fistula 12/21/21  1112  Forearm  339            Intake/Output Last 24 hours No intake or output data in the 24 hours ending 11/25/22  1308  Labs/Imaging Results for orders placed or performed during the hospital encounter of 11/25/22 (from the past 48 hour(s))  Resp panel by RT-PCR (RSV, Flu A&B, Covid) Anterior Nasal Swab     Status: None   Collection Time: 11/25/22  8:17 AM   Specimen: Anterior Nasal Swab  Result Value Ref Range   SARS Coronavirus 2 by RT PCR NEGATIVE NEGATIVE   Influenza A by PCR NEGATIVE NEGATIVE   Influenza B by PCR NEGATIVE NEGATIVE    Comment: (NOTE) The Xpert Xpress SARS-CoV-2/FLU/RSV plus assay is intended as an aid in the diagnosis of influenza from Nasopharyngeal swab specimens and should not be used as a sole basis for treatment. Nasal washings and aspirates are unacceptable for Xpert Xpress SARS-CoV-2/FLU/RSV testing.  Fact Sheet for Patients: BloggerCourse.com  Fact Sheet for Healthcare Providers: SeriousBroker.it  This test is not yet approved or cleared by the [place] FDA and has been authorized for detection and/or diagnosis of SARS-CoV-2 by FDA under an Emergency Use Authorization (EUA). This EUA will remain in effect (meaning this test can be used) for the duration of the COVID-19 declaration under Section 564(b)(1) of the Act, 21 U.S.C. section 360bbb-3(b)(1), unless the authorization is terminated or revoked.     Resp Syncytial Virus by PCR NEGATIVE NEGATIVE    Comment: (NOTE) Fact Sheet for Patients: BloggerCourse.com  Fact Sheet for Healthcare Providers: SeriousBroker.it  This test is not yet approved or cleared by the [place] FDA and has been authorized for detection and/or diagnosis of SARS-CoV-2 by FDA under an Emergency Use Authorization (EUA). This EUA will remain in effect (meaning this test can be used) for the duration of the COVID-19 declaration under Section 564(b)(1) of the Act, 21 U.S.C. section 360bbb-3(b)(1), unless the authorization is  terminated or revoked.  Performed at [facility], [address], [place], [place] [address]   Lactic acid, plasma     Status: Abnormal   Collection Time: 11/25/22  8:17 AM  Result Value Ref Range   Lactic Acid, Venous 2.4 (HH) 0.5 - 1.9 mmol/L    Comment: ATTEMPTED TO CALL @0955  am CRITICAL RESULT CALLED TO, READ BACK BY AND VERIFIED WITH K.MOON CHARGE NURSE @1012  06.14.2024 E.AHMED Performed at [facility], [address] [place], [place] [address]   Comprehensive metabolic panel     Status: Abnormal   Collection Time: 11/25/22  8:17 AM  Result Value Ref Range   Sodium 137 135 - 145 mmol/L   Potassium 4.0 3.5 - 5.1 mmol/L   Chloride 98 98 - 111 mmol/L   CO2 24 22 - 32 mmol/L   Glucose, Bld 128 (H) 70 - 99 mg/dL    Comment: Glucose reference range applies only to samples taken after fasting for at least 8 hours.   BUN 35 (H) 8 - 23 mg/dL   Creatinine, Ser 0.98 (H)  0.61 - 1.24 mg/dL   Calcium 8.6 (L) 8.9 - 10.3 mg/dL   Total Protein 6.5 6.5 - 8.1 g/dL   Albumin 3.1 (L) 3.5 - 5.0 g/dL   AST 12 (L) 15 - 41 U/L   ALT 8 0 - 44 U/L   Alkaline Phosphatase 96 38 - 126 U/L   Total Bilirubin 0.8 0.3 - 1.2 mg/dL   GFR, Estimated 14 (L) >60 mL/min    Comment: (NOTE) Calculated using the CKD-EPI Creatinine Equation (2021)    Anion gap 15 5 - 15    Comment: Performed at [facility], [address] [place], [place] [address]  CBC with Differential     Status: Abnormal   Collection Time: 11/25/22  8:17 AM  Result Value Ref Range   WBC 8.5 4.0 - 10.5 K/uL   RBC 4.18 (L) 4.22 - 5.81 MIL/uL   Hemoglobin 10.7 (L) 13.0 - 17.0 g/dL   HCT 60.4 (L) 54.0 - 98.1 %   MCV 85.9 80.0 - 100.0 fL   MCH 25.6 (L) 26.0 - 34.0 pg   MCHC 29.8 (L) 30.0 - 36.0 g/dL   RDW 19.1 (H) 47.8 - 29.5 %   Platelets 290 150 - 400 K/uL   nRBC 0.0 0.0 - 0.2 %   Neutrophils Relative % 72 %   Neutro Abs 6.2 1.7 - 7.7 K/uL   Lymphocytes Relative 15 %   Lymphs Abs 1.3 0.7 - 4.0 K/uL    Monocytes Relative 8 %   Monocytes Absolute 0.7 0.1 - 1.0 K/uL   Eosinophils Relative 3 %   Eosinophils Absolute 0.2 0.0 - 0.5 K/uL   Basophils Relative 1 %   Basophils Absolute 0.1 0.0 - 0.1 K/uL   Immature Granulocytes 1 %   Abs Immature Granulocytes 0.04 0.00 - 0.07 K/uL    Comment: Performed at [facility], [address], [place], [place] [address]  Protime-INR     Status: Abnormal   Collection Time: 11/25/22  8:17 AM  Result Value Ref Range   Prothrombin Time 15.3 (H) 11.4 - 15.2 seconds   INR 1.2 0.8 - 1.2    Comment: (NOTE) INR goal varies based on device and disease states. Performed at [facility], [address] [place], [place] [address]   APTT     Status: None   Collection Time: 11/25/22  8:17 AM  Result Value Ref Range   aPTT 34 24 - 36 seconds    Comment: Performed at [facility], [address], [place], [place] [address]  Troponin I (High Sensitivity)     Status: Abnormal   Collection Time: 11/25/22  8:17 AM  Result Value Ref Range   Troponin I (High Sensitivity) 24 (H) <18 ng/L    Comment: (NOTE) Elevated high sensitivity troponin I (hsTnI) values and significant  changes across serial measurements may suggest ACS but many other  chronic and acute conditions are known to elevate hsTnI results.  Refer to the "Links" section for chest pain algorithms and additional  guidance. Performed at [facility], [address], [place], [place] [address]   I-stat chem 8, ED (not at [facility], [facility] or [facility])     Status: Abnormal   Collection Time: 11/25/22  8:46 AM  Result Value Ref Range   Sodium 136 135 - 145 mmol/L   Potassium 4.2 3.5 - 5.1 mmol/L   Chloride 101 98 - 111 mmol/L   BUN 37 (H)  8 - 23 mg/dL   Creatinine, Ser 7.82 (H) 0.61 - 1.24 mg/dL   Glucose, Bld 956 (H) 70 - 99 mg/dL    Comment: Glucose reference range applies only to samples taken after fasting for at least 8 hours.   Calcium, Ion 0.97 (L) 1.15 - 1.40 mmol/L   TCO2 26 22 -  32 mmol/L   Hemoglobin 11.9 (L) 13.0 - 17.0 g/dL   HCT 21.3 (L) 08.6 - 57.8 %  I-Stat arterial blood gas, ED ([facility] ED, [facility], [facility])     Status: Abnormal   Collection Time: 11/25/22  8:53 AM  Result Value Ref Range   pH, Arterial 7.453 (H) 7.35 - 7.45   pCO2 arterial 36.9 32 - 48 mmHg   pO2, Arterial 75 (L) 83 - 108 mmHg   Bicarbonate 26.0 20.0 - 28.0 mmol/L   TCO2 27 22 - 32 mmol/L   O2 Saturation 96 %   Acid-Base Excess 2.0 0.0 - 2.0 mmol/L   Sodium 138 135 - 145 mmol/L   Potassium 3.5 3.5 - 5.1 mmol/L   Calcium, Ion 1.11 (L) 1.15 - 1.40 mmol/L   HCT 31.0 (L) 39.0 - 52.0 %   Hemoglobin 10.5 (L) 13.0 - 17.0 g/dL   Patient temperature 46.9 F    Sample type ARTERIAL   Lactic acid, plasma     Status: None   Collection Time: 11/25/22 10:17 AM  Result Value Ref Range   Lactic Acid, Venous 1.2 0.5 - 1.9 mmol/L    Comment: Performed at [facility], [address], [place], [place] [address]  Troponin I (High Sensitivity)     Status: Abnormal   Collection Time: 11/25/22 11:04 AM  Result Value Ref Range   Troponin I (High Sensitivity) 155 (HH) <18 ng/L    Comment: CRITICAL RESULT CALLED TO, READ BACK BY AND VERIFIED WITH Norberta Stobaugh [facility] RN @1248  06.14.2024 E.AHMED (NOTE) Elevated high sensitivity troponin I (hsTnI) values and significant  changes across serial measurements may suggest ACS but many other  chronic and acute conditions are known to elevate hsTnI results.  Refer to the "Links" section for chest pain algorithms and additional  guidance. Performed at [facility], [address], [place], [place] [address]    CT Angio Chest/Abd/Pel for Dissection W and/or Wo Contrast  Result Date: 11/25/2022 CLINICAL DATA:  Concern for thoracic aortic dissection.  Chest pain. EXAM: CT ANGIOGRAPHY CHEST, ABDOMEN AND PELVIS TECHNIQUE: Non-contrast CT of the chest was initially obtained. Multidetector CT imaging through the chest, abdomen and pelvis was performed using the standard  protocol during bolus administration of intravenous contrast. Multiplanar reconstructed images and MIPs were obtained and reviewed to evaluate the vascular anatomy. RADIATION DOSE REDUCTION: This exam was performed according to the departmental dose-optimization program which includes automated exposure control, adjustment of the mA and/or kV according to patient size and/or use of iterative reconstruction technique. CONTRAST:  OMNIPAQUE IOHEXOL 350 MG/ML SOLN COMPARISON:  None Available. FINDINGS: CTA CHEST FINDINGS Cardiovascular: Noncontrast series demonstrates no intramural hematoma within the thoracic aorta. Contrast enhanced imaging demonstrates no dissection or aneurysm of the thoracic aorta. Post CABG anatomy. No pericardial fluid. No pulmonary embolism. Mediastinum/Nodes: No axillary or supraclavicular adenopathy. No mediastinal or hilar adenopathy. No pericardial fluid. Esophagus normal. Lungs/Pleura: Moderate layering RIGHT pleural effusion. Mild RIGHT basilar atelectasis. No pulmonary infarction identified. No pneumonia. Musculoskeletal: Fracture of the posterior LEFT sixth rib (image 52/8). Healed fractures of lower posterior RIGHT ribs on image 137. Review  of the MIP images confirms the above findings. CTA ABDOMEN AND PELVIS FINDINGS VASCULAR Aorta: Normal caliber aorta without aneurysm, dissection, vasculitis or significant stenosis. Celiac: Patent without evidence of aneurysm, dissection, vasculitis or significant stenosis. SMA: Patent without evidence of aneurysm, dissection, vasculitis or significant stenosis. Renals: Both renal arteries are patent without evidence of aneurysm, dissection, vasculitis, fibromuscular dysplasia or significant stenosis. IMA: Patent without evidence of aneurysm, dissection, vasculitis or significant stenosis. Inflow: Patent without evidence of aneurysm, dissection, vasculitis or significant stenosis. Veins: No obvious venous abnormality within the limitations of  this arterial phase study. Review of the MIP images confirms the above findings. NON-VASCULAR Hepatobiliary: No focal hepatic lesion. Normal gallbladder. No biliary duct dilatation. Common bile duct is normal. Pancreas: Pancreas is normal. No ductal dilatation. No pancreatic inflammation. Spleen: Normal spleen Adrenals/urinary tract: Adrenal glands and kidneys are normal. Bilateral simple fluid attenuation Bosniak 1 renal cysts. No follow-up recommended. The ureters and bladder normal. Stomach/Bowel: Stomach, small bowel, appendix, and cecum are normal. The colon and rectosigmoid colon are normal. Multiple diverticula of the descending colon and sigmoid colon without acute inflammation. Vascular/Lymphatic: Abdominal aorta is normal caliber. No periportal or retroperitoneal adenopathy. No pelvic adenopathy. Reproductive: Other: No free fluid. Musculoskeletal: No aggressive osseous lesion. Intramedullary nail fixation of the RIGHT femur with associated postprocedural inflammatory change. Review of the MIP images confirms the above findings. IMPRESSION: CHEST IMPRESSION: 1. No evidence of thoracic aortic dissection or aneurysm. 2. No pulmonary embolism. 3. Moderate RIGHT pleural effusion with RIGHT basilar atelectasis. 4. Fracture of the posterior LEFT sixth rib is age indeterminate. Fracture of the posterior RIGHT ribs are chronic. PELVIS IMPRESSION: 1. No evidence of abdominal aortic aneurysm or dissection. 2. No acute findings in the abdomen pelvis. 3. LEFT colon diverticulosis without evidence diverticulitis. 4. RIGHT femur internal fixation Electronically Signed   By: Genevive Bi M.D.   On: 11/25/2022 09:44   DG Chest Port 1 View  Result Date: 11/25/2022 CLINICAL DATA:  Questionable sepsis EXAM: PORTABLE CHEST 1 VIEW COMPARISON:  Chest radiograph dated 09/22/2022 FINDINGS: Low lung volumes with bronchovascular crowding. Right basilar patchy opacities. Trace right pleural effusion. No pneumothorax.  Enlarged cardiomediastinal silhouette. Median sternotomy wires are nondisplaced. IMPRESSION: 1. Right basilar patchy opacities, which may represent atelectasis, aspiration, or pneumonia. 2. Trace right pleural effusion. 3. Cardiomegaly. Electronically Signed   By: Agustin Cree M.D.   On: 11/25/2022 09:04    Pending Labs Unresulted Labs (From admission, onward)     Start     Ordered   11/26/22 0500  Basic metabolic panel  Tomorrow morning,   R        11/25/22 1134   11/26/22 0500  CBC  Tomorrow morning,   R        11/25/22 1134   11/25/22 0817  Blood Culture (routine x 2)  (Septic presentation on arrival (screening labs, nursing and treatment orders for obvious sepsis))  BLOOD CULTURE X 2,   STAT      11/25/22 0817            Vitals/Pain Today's Vitals   11/25/22 1130 11/25/22 1145 11/25/22 1215 11/25/22 1230  BP:      Pulse: 77 81 84 85  Resp: (!) 9 14 18 17   Temp:      TempSrc:      SpO2: 97% 100% 100% 100%  Weight:      PainSc:        Isolation Precautions No active isolations  Medications Medications  heparin injection 5,000 Units (has no administration in time range)  amiodarone (PACERONE) tablet 200 mg (has no administration in time range)  metoprolol tartrate (LOPRESSOR) tablet 25 mg (has no administration in time range)  lactated ringers bolus 1,000 mL (0 mLs Intravenous Stopped 11/25/22 0849)  iohexol (OMNIPAQUE) 350 MG/ML injection 100 mL (100 mLs Intravenous Contrast Given 11/25/22 0914)    Mobility walks with device     Focused Assessments Cardiac Assessment Handoff:  Cardiac Rhythm: Normal sinus rhythm Lab Results  Component Value Date   CKTOTAL 114 08/21/2019   TROPONINI 0.15 (HH) 04/26/2018   Lab Results  Component Value Date   DDIMER 2.83 (H) 08/18/2022   Does the Patient currently have chest pain? Yes    R Recommendations: See Admitting Provider Note  Report given to:   Additional Notes:

## 2022-11-25 NOTE — H&P (Addendum)
Hospital Admission History and Physical Service Pager: [phone]  Patient name: [name] Medical record number: [id number] Date of Birth: 1955-12-09 Age: 68 y.o. Gender: male  Primary Care Provider: Babs Sciara, MD Consultants: Cardiology, Nephrology (due to ESRD on HD) Code Status: DNR Preferred Emergency Contact: Auburn Omori (sister) [phone]  Chief Complaint: chest pain, hypotension, tachycardia  Assessment and Plan: [name] is a 67 y.o. male presenting with chest pain, hypotension, and tachycardia. Differential for presentation of this includes arrhythmia including SVT vs a-fib RVR, ACS, pneumonia, PE, CHF exacerbation. Pertinent PMH includes a-fib and SVT, ESRD on HD MWF, CAD s/p CABG, HTN, T2DM, combined CHF, esophageal adenocarcinoma, GI bleed, chronic respiratory failure (on 3L baseline O2).  Hospital Problem List      Hospital     * (Principal) SVT (supraventricular tachycardia)     Presented with chest pain, tachycardia, hypotension. Initial EKG  suspicious for SVT.  Patient with history of the same although also  recently diagnosed with A-fib RVR during similar episode in April. Per ED  provider, converted spontaneously. Now in NSR, asymptomatic, tachycardia  and hypotension have resolved -Admit to med tele, FMTS attending Dr. Miquel Dunn -Consult cardiology, appreciate recommendations -Awaiting med rec from facility, per last cards note should be on  amiodarone 200mg  daily and metoprolol 25mg  BID which I will continue -Tele monitoring        Elevated troponin     Mildly elevated hs-trop of 24.  Likely demand in the setting of his  tachycardia and hypotension. He also is ESRD. This is stable from his  previous values. -Follow-up results of 2nd troponin        Type 2 diabetes mellitus (HCC)     Awaiting med rec from facility. Per chart is only on sliding scale at  home -CBG monitoring with meals and at bedtime -Can add sliding scale if  needed        ESRD (end stage renal disease) on dialysis [facility])     Receives outpatient HD MWF. Still makes urine. Last dialysis 6/12.  -Nephrology consulted, appreciate recommendations        Pleural effusion     Noted on CTA chest/abd/pelvis. Seems to be chronic issue. Address  outpatient.        Esophageal adenocarcinoma (HCC)     Follows with [facility]. Treated surgically and doing well per patient  report -dysphagia 2 diet -SLP eval        Hip fracture (HCC)     S/p internal fixation 09/09/32 -PT/OT eval and treat      Chronic and stable conditions: Chronic respiratory failure: on 3L  at baseline, continue   FEN/GI: DYS 2 diet VTE Prophylaxis: Subcu heparin  Disposition: med-tele  History of Present Illness:  [name] is a 67 y.o. male presenting with chest pain.  Patient currently resides at [facility]) where he has lived for the past 2 months s/p right hip fracture with repair on 09/10/22.  Woke up this morning with chest pain.  Was noted to be hypotensive and tachycardic when they checked his vitals and was therefore brought to the ED.  Patient also felt short of breath with his chest pain and felt generally weak.  Vomited x1 which he reports is not uncommon for him. Denies diaphoresis, palpitations, dizziness, syncope, abdominal pain, fever, or other complaints.   Reports his chest pain and shortness of breath have resolved since being in the ED. Feels tired but  otherwise no complaints at the present time.  Of note, he was seen in the ED on 09/22/22 with similar presentation of chest pain, fast HR, soft BP-- diagnosed with a-fib, seen by cards who recommended metoprolol 25mg  BID in addition to his amiodarone.   In the ED today, patient presented with HR 150s and BP 70/50s but rapidly converted to HR in the 80s and normal BP (systolic 140s). Received 1L LR bolus.  Review Of Systems: Per HPI  Pertinent Past Medical History: ESRD on HD  MWF CAD HTN T2DM Esophageal cancer Combined CHF Chronic resp failure on 3L H/o CVA H/o GI bleed Remainder reviewed in history tab.   Pertinent Past Surgical History: Recent right hip internal fixation CABG Remainder reviewed in history tab.   Pertinent Social History: Tobacco use: Former Alcohol use: Rarely Other Substance use: Denies Lives in SNF [facility])-- for past 2 months, prior to that lived alone  Pertinent Family History: None pertinent  Remainder reviewed in history tab.   Important Outpatient Medications: Patient unaware of his medications Trying to locate [facility] from facility  Per chart: Amiodarone 200mg  daily Metoprolol 25mg  BID ASA 81mg  daily Zoloft 100mg  daily  Remainder reviewed in medication history.   Objective: BP (!) 144/69   Pulse 85   Temp (!) 97.5 F (36.4 C) (Oral)   Resp 17   Wt 85.1 kg   SpO2 100%   BMI 26.92 kg/m  Exam: General: Alert, tired appearing, NAD Eyes: Normal sclera and conjunctiva, PERRL ENTM: Moist mucous membranes Neck: Supple, no JVD Cardiovascular: RRR, normal S1/S2, II/VI systolic murmur Respiratory: Normal effort on 3L [place],  Gastrointestinal: Abdomen soft, nontender, nondistended Ext: No lower extremity edema Derm: venous stasis dermatitis/pigmentation changes s/p burn of right shin Neuro: Alert, oriented x3, moves all extremities equally, generalized weakness/fatigue but no focal findings  Labs:  CBC BMET  Recent Labs  Lab 11/25/22 0817 11/25/22 0846 11/25/22 0853  WBC 8.5  --   --   HGB 10.7*   < > 10.5*  HCT 35.9*   < > 31.0*  PLT 290  --   --    < > = values in this interval not displayed.   Recent Labs  Lab 11/25/22 0817 11/25/22 0846 11/25/22 0853  NA 137 136 138  K 4.0 4.2 3.5  CL 98 101  --   CO2 24  --   --   BUN 35* 37*  --   CREATININE 4.39* 4.60*  --   GLUCOSE 128* 133*  --   CALCIUM 8.6*  --   --     Troponin 24 Lactic acid 2.4  EKG: My own interpretation (not copied  from electronic read)- narrow complex tachycardia at 151bpm   Imaging Studies Performed:  CT Angio Chest/Abd/Pel for Dissection W and/or Wo Contrast Result Date: 11/25/2022 IMPRESSION:  CHEST IMPRESSION: 1. No evidence of thoracic aortic dissection or aneurysm. 2. No pulmonary embolism. 3. Moderate RIGHT pleural effusion with RIGHT basilar atelectasis. 4. Fracture of the posterior LEFT sixth rib is age indeterminate. Fracture of the posterior RIGHT ribs are chronic. PELVIS IMPRESSION: 1. No evidence of abdominal aortic aneurysm or dissection. 2. No acute findings in the abdomen pelvis. 3. LEFT colon diverticulosis without evidence diverticulitis. 4. RIGHT femur internal fixation Electronically Signed   By: Genevive Bi M.D.   On: 11/25/2022 09:44   DG Chest Port 1 View Result Date: 11/25/2022 IMPRESSION: 1. Right basilar patchy opacities, which may represent atelectasis, aspiration, or pneumonia. 2. Trace  right pleural effusion. 3. Cardiomegaly. Electronically Signed   By: Agustin Cree M.D.   On: 11/25/2022 09:04     Maury Dus, MD 11/25/2022, 12:51 PM PGY-3, [facility]  FPTS Intern pager: [phone], text pages welcome Secure chat group [facility] Teaching Service

## 2022-11-25 NOTE — Progress Notes (Signed)
FMTS Brief Progress Note  S: Saw patient at bedside with Dr. Velna Ochs.  Patient was sleeping while in the room woke him up and he denied any kind of chest pain, shortness of breath, abdominal pain.  He is just a little bit tired.  Rates are much more controlled around 80s to 90s when I was in the room.  He has converted to sinus rhythm.  O: BP (!) 156/81 (BP Location: Right Arm)   Pulse 85   Temp 97.9 F (36.6 C) (Oral)   Resp 18   Wt 85.1 kg   SpO2 95%   BMI 26.92 kg/m    General: Chronically ill appearing, NAD, awake, alert, responsive to questions Head: Normocephalic atraumatic CV: Regular rate and rhythm, systolic murmur present, 2+ radial pulses Respiratory: chest rises symmetrically,  no increased work of breathing on [place] Extremities: well perfused, trace edema in LE  A/P: SVT  elevated troponin Patient is chronically ill-appearing but in no acute distress when in the room.  He denies any sort of cardiac symptoms currently (no CP, abdominal pain, SOB, diaphoresis).  Rates are much more controlled and patient is in normal sinus rhythm. I called Dr. Orson Aloe who is the on-call cardiology fellow about patient's elevated troponins 155 initially up to 608 now downtrending to 595.  Discussed with him that initial rates in the ED documented were up to 150s.  He discussed given how fast patient's heart was racing and the fact that he does not have any chest pain or symptoms currently that this is likely demand ischemia.  No further recommendations at this time. - Orders reviewed. Labs for AM ordered, which was adjusted as needed.  - If condition changes, plan includes repeat EKG/trops and reaching back out to cardiology  Levin Erp, MD 11/25/2022, 8:47 PM PGY-2, [facility] Night Resident  Please page [phone] with questions.

## 2022-11-25 NOTE — Assessment & Plan Note (Addendum)
Presented with chest pain, tachycardia, hypotension. Initial EKG suspicious for SVT.  Patient with history of the same although also recently diagnosed with A-fib RVR during similar episode in April. Per ED provider, converted spontaneously. Now in NSR, asymptomatic, tachycardia and hypotension have resolved -Admit to med tele, FMTS attending Dr. Miquel Dunn -Consult cardiology, appreciate recommendations -Awaiting med rec from facility, per last cards note should be on amiodarone 200mg  daily and metoprolol 25mg  BID which I will continue -Tele monitoring

## 2022-11-25 NOTE — Assessment & Plan Note (Signed)
Noted on CTA chest/abd/pelvis. Seems to be chronic issue. Address outpatient.

## 2022-11-26 ENCOUNTER — Observation Stay (HOSPITAL_COMMUNITY): Payer: Medicare Other

## 2022-11-26 DIAGNOSIS — I5042 Chronic combined systolic (congestive) and diastolic (congestive) heart failure: Secondary | ICD-10-CM | POA: Diagnosis present

## 2022-11-26 DIAGNOSIS — J9811 Atelectasis: Secondary | ICD-10-CM | POA: Diagnosis present

## 2022-11-26 DIAGNOSIS — I959 Hypotension, unspecified: Secondary | ICD-10-CM | POA: Diagnosis not present

## 2022-11-26 DIAGNOSIS — H5461 Unqualified visual loss, right eye, normal vision left eye: Secondary | ICD-10-CM | POA: Diagnosis present

## 2022-11-26 DIAGNOSIS — J90 Pleural effusion, not elsewhere classified: Secondary | ICD-10-CM | POA: Diagnosis present

## 2022-11-26 DIAGNOSIS — I2489 Other forms of acute ischemic heart disease: Secondary | ICD-10-CM | POA: Diagnosis not present

## 2022-11-26 DIAGNOSIS — N250 Renal osteodystrophy: Secondary | ICD-10-CM | POA: Diagnosis present

## 2022-11-26 DIAGNOSIS — S72001D Fracture of unspecified part of neck of right femur, subsequent encounter for closed fracture with routine healing: Secondary | ICD-10-CM | POA: Diagnosis not present

## 2022-11-26 DIAGNOSIS — Z5982 Transportation insecurity: Secondary | ICD-10-CM | POA: Diagnosis not present

## 2022-11-26 DIAGNOSIS — I132 Hypertensive heart and chronic kidney disease with heart failure and with stage 5 chronic kidney disease, or end stage renal disease: Secondary | ICD-10-CM | POA: Diagnosis not present

## 2022-11-26 DIAGNOSIS — I361 Nonrheumatic tricuspid (valve) insufficiency: Secondary | ICD-10-CM | POA: Diagnosis not present

## 2022-11-26 DIAGNOSIS — I471 Supraventricular tachycardia, unspecified: Secondary | ICD-10-CM | POA: Diagnosis not present

## 2022-11-26 DIAGNOSIS — Z79899 Other long term (current) drug therapy: Secondary | ICD-10-CM | POA: Diagnosis not present

## 2022-11-26 DIAGNOSIS — I493 Ventricular premature depolarization: Secondary | ICD-10-CM | POA: Diagnosis present

## 2022-11-26 DIAGNOSIS — C159 Malignant neoplasm of esophagus, unspecified: Secondary | ICD-10-CM | POA: Diagnosis not present

## 2022-11-26 DIAGNOSIS — Z794 Long term (current) use of insulin: Secondary | ICD-10-CM | POA: Diagnosis not present

## 2022-11-26 DIAGNOSIS — D631 Anemia in chronic kidney disease: Secondary | ICD-10-CM | POA: Diagnosis not present

## 2022-11-26 DIAGNOSIS — I4891 Unspecified atrial fibrillation: Secondary | ICD-10-CM | POA: Diagnosis present

## 2022-11-26 DIAGNOSIS — Z1152 Encounter for screening for COVID-19: Secondary | ICD-10-CM | POA: Diagnosis not present

## 2022-11-26 DIAGNOSIS — I2510 Atherosclerotic heart disease of native coronary artery without angina pectoris: Secondary | ICD-10-CM | POA: Diagnosis present

## 2022-11-26 DIAGNOSIS — N186 End stage renal disease: Secondary | ICD-10-CM | POA: Diagnosis not present

## 2022-11-26 DIAGNOSIS — Z66 Do not resuscitate: Secondary | ICD-10-CM | POA: Diagnosis not present

## 2022-11-26 DIAGNOSIS — E1122 Type 2 diabetes mellitus with diabetic chronic kidney disease: Secondary | ICD-10-CM | POA: Diagnosis present

## 2022-11-26 DIAGNOSIS — I4719 Other supraventricular tachycardia: Secondary | ICD-10-CM | POA: Diagnosis not present

## 2022-11-26 DIAGNOSIS — J9610 Chronic respiratory failure, unspecified whether with hypoxia or hypercapnia: Secondary | ICD-10-CM | POA: Diagnosis not present

## 2022-11-26 DIAGNOSIS — S2232XA Fracture of one rib, left side, initial encounter for closed fracture: Secondary | ICD-10-CM | POA: Diagnosis not present

## 2022-11-26 DIAGNOSIS — Z992 Dependence on renal dialysis: Secondary | ICD-10-CM | POA: Diagnosis not present

## 2022-11-26 LAB — BASIC METABOLIC PANEL
Anion gap: 13 (ref 5–15)
BUN: 36 mg/dL — ABNORMAL HIGH (ref 8–23)
CO2: 25 mmol/L (ref 22–32)
Calcium: 8.4 mg/dL — ABNORMAL LOW (ref 8.9–10.3)
Chloride: 95 mmol/L — ABNORMAL LOW (ref 98–111)
Creatinine, Ser: 4.76 mg/dL — ABNORMAL HIGH (ref 0.61–1.24)
GFR, Estimated: 13 mL/min — ABNORMAL LOW (ref >60)
Glucose, Bld: 203 mg/dL — ABNORMAL HIGH (ref 70–99)
Potassium: 3.8 mmol/L (ref 3.5–5.1)
Sodium: 133 mmol/L — ABNORMAL LOW (ref 135–145)

## 2022-11-26 LAB — CBC
HCT: 34.7 % — ABNORMAL LOW (ref 39.0–52.0)
Hemoglobin: 10.5 g/dL — ABNORMAL LOW (ref 13.0–17.0)
MCH: 25.5 pg — ABNORMAL LOW (ref 26.0–34.0)
MCHC: 30.3 g/dL (ref 30.0–36.0)
MCV: 84.2 fL (ref 80.0–100.0)
Platelets: 274 10*3/uL (ref 150–400)
RBC: 4.12 MIL/uL — ABNORMAL LOW (ref 4.22–5.81)
RDW: 16.9 % — ABNORMAL HIGH (ref 11.5–15.5)
WBC: 6.1 10*3/uL (ref 4.0–10.5)
nRBC: 0 % (ref 0.0–0.2)

## 2022-11-26 LAB — GLUCOSE, CAPILLARY
Glucose-Capillary: 168 mg/dL — ABNORMAL HIGH (ref 70–99)
Glucose-Capillary: 129 mg/dL — ABNORMAL HIGH (ref 70–99)
Glucose-Capillary: 140 mg/dL — ABNORMAL HIGH (ref 70–99)

## 2022-11-26 LAB — ECHOCARDIOGRAM LIMITED
S' Lateral: 3.85 cm
Weight: 2895.96 oz

## 2022-11-26 LAB — HEPATITIS B SURFACE ANTIBODY, QUANTITATIVE: Hep B S AB Quant (Post): 75.3 m[IU]/mL (ref >9.9)

## 2022-11-26 LAB — CULTURE, BLOOD (ROUTINE X 2): Special Requests: ADEQUATE

## 2022-11-26 MED ORDER — SERTRALINE HCL 100 MG PO TABS
100.0000 mg | ORAL_TABLET | Freq: Every day | ORAL | Status: DC
  Administered 2022-11-27 – 2022-11-29 (×3): 100 mg via ORAL
  Filled 2022-11-26 (×4): qty 1

## 2022-11-26 MED ORDER — INSULIN ASPART 100 UNIT/ML IJ SOLN
0.0000 [IU] | Freq: Three times a day (TID) | INTRAMUSCULAR | Status: DC
  Administered 2022-11-26: 1 [IU] via SUBCUTANEOUS
  Administered 2022-11-27: 2 [IU] via SUBCUTANEOUS
  Administered 2022-11-27 (×2): 1 [IU] via SUBCUTANEOUS
  Administered 2022-11-28 – 2022-11-29 (×4): 2 [IU] via SUBCUTANEOUS

## 2022-11-26 MED ORDER — ACETAMINOPHEN 325 MG PO TABS
650.0000 mg | ORAL_TABLET | Freq: Four times a day (QID) | ORAL | Status: DC | PRN
  Administered 2022-11-26 – 2022-11-27 (×2): 650 mg via ORAL
  Filled 2022-11-26 (×2): qty 2

## 2022-11-26 MED ORDER — HEPARIN SODIUM (PORCINE) 1000 UNIT/ML DIALYSIS
3000.0000 [IU] | Freq: Once | INTRAMUSCULAR | Status: AC
  Administered 2022-11-26: 3000 [IU] via INTRAVENOUS_CENTRAL
  Filled 2022-11-26: qty 3

## 2022-11-26 MED ORDER — CALCIUM ACETATE (PHOS BINDER) 667 MG PO CAPS
667.0000 mg | ORAL_CAPSULE | Freq: Three times a day (TID) | ORAL | Status: DC
  Administered 2022-11-26 – 2022-11-29 (×9): 667 mg via ORAL
  Filled 2022-11-26 (×9): qty 1

## 2022-11-26 MED ORDER — AMIODARONE HCL 200 MG PO TABS
200.0000 mg | ORAL_TABLET | Freq: Every day | ORAL | Status: DC
  Administered 2022-11-26 – 2022-11-27 (×2): 200 mg via ORAL
  Filled 2022-11-26 (×2): qty 1

## 2022-11-26 MED ORDER — POLYETHYLENE GLYCOL 3350 17 G PO PACK
17.0000 g | PACK | Freq: Every day | ORAL | Status: DC
  Filled 2022-11-26 (×4): qty 1

## 2022-11-26 MED ORDER — HEPARIN SODIUM (PORCINE) 1000 UNIT/ML DIALYSIS
2000.0000 [IU] | INTRAMUSCULAR | Status: AC | PRN
  Administered 2022-11-26: 2000 [IU] via INTRAVENOUS_CENTRAL
  Filled 2022-11-26: qty 2

## 2022-11-26 MED ORDER — ALBUTEROL SULFATE (2.5 MG/3ML) 0.083% IN NEBU: 3.0000 mL | INHALATION_SOLUTION | Freq: Four times a day (QID) | RESPIRATORY_TRACT | Status: DC | PRN

## 2022-11-26 MED ORDER — HEPARIN SODIUM (PORCINE) 1000 UNIT/ML IJ SOLN
INTRAMUSCULAR | Status: AC
  Filled 2022-11-26: qty 4

## 2022-11-26 NOTE — Progress Notes (Signed)
Echocardiogram 2D Echocardiogram has been performed.  [name] 11/26/2022, 3:43 PM

## 2022-11-26 NOTE — Progress Notes (Signed)
[name] Progress Note   Subjective:   Seen at onset of HD - says he is feeling "peachy." Denies CP, wearing O2 today. Feels too weak to stand for weight today - will start with 2L UFG on HD and go from there. Hypotension has resolved.  Objective Vitals:   11/25/22 2100 11/26/22 0000 11/26/22 0450 11/26/22 0709  BP: 138/77 (!) 149/80 (!) 149/76 (!) 154/75  Pulse: 79 78 69 72  Resp: 11 14 16 18   Temp:  97.9 F (36.6 C) 97.8 F (36.6 C) 98.7 F (37.1 C)  TempSrc:  Oral Oral Oral  SpO2: 100% 100% 99% 100%  Weight:   83.5 kg    Physical Exam General: Chronically ill appearing man, + facial edema, wearing O2 Heart: RRR; no murmur Lungs: CTA anteriorly Abdomen: soft Extremities: no LE edema Dialysis Access: LUE AVF + bruit  Additional Objective Labs: Basic Metabolic Panel: Recent Labs  Lab 11/25/22 0817 11/25/22 0846 11/25/22 0853 11/26/22 0040  NA 137 136 138 133*  K 4.0 4.2 3.5 3.8  CL 98 101  --  95*  CO2 24  --   --  25  GLUCOSE 128* 133*  --  203*  BUN 35* 37*  --  36*  CREATININE 4.39* 4.60*  --  4.76*  CALCIUM 8.6*  --   --  8.4*   Liver Function Tests: Recent Labs  Lab 11/25/22 0817  AST 12*  ALT 8  ALKPHOS 96  BILITOT 0.8  PROT 6.5  ALBUMIN 3.1*   CBC: Recent Labs  Lab 11/25/22 0817 11/25/22 0846 11/25/22 0853 11/26/22 0040  WBC 8.5  --   --  6.1  NEUTROABS 6.2  --   --   --   HGB 10.7* 11.9* 10.5* 10.5*  HCT 35.9* 35.0* 31.0* 34.7*  MCV 85.9  --   --  84.2  PLT 290  --   --  274   Blood Culture    Component Value Date/Time   SDES BLOOD RIGHT ARM 11/25/2022 0822   SPECREQUEST  11/25/2022 0822    BOTTLES DRAWN AEROBIC AND ANAEROBIC Blood Culture adequate volume   CULT  11/25/2022 0822    NO GROWTH < 24 HOURS Performed at [facility], [address], [place], [place] [address]    REPTSTATUS PENDING 11/25/2022 6045   Studies/Results: CT Angio Chest/Abd/Pel for Dissection W and/or Wo Contrast  Result Date:  11/25/2022 CLINICAL DATA:  Concern for thoracic aortic dissection.  Chest pain. EXAM: CT ANGIOGRAPHY CHEST, ABDOMEN AND PELVIS TECHNIQUE: Non-contrast CT of the chest was initially obtained. Multidetector CT imaging through the chest, abdomen and pelvis was performed using the standard protocol during bolus administration of intravenous contrast. Multiplanar reconstructed images and MIPs were obtained and reviewed to evaluate the vascular anatomy. RADIATION DOSE REDUCTION: This exam was performed according to the departmental dose-optimization program which includes automated exposure control, adjustment of the mA and/or kV according to patient size and/or use of iterative reconstruction technique. CONTRAST:  OMNIPAQUE IOHEXOL 350 MG/ML SOLN COMPARISON:  None Available. FINDINGS: CTA CHEST FINDINGS Cardiovascular: Noncontrast series demonstrates no intramural hematoma within the thoracic aorta. Contrast enhanced imaging demonstrates no dissection or aneurysm of the thoracic aorta. Post CABG anatomy. No pericardial fluid. No pulmonary embolism. Mediastinum/Nodes: No axillary or supraclavicular adenopathy. No mediastinal or hilar adenopathy. No pericardial fluid. Esophagus normal. Lungs/Pleura: Moderate layering RIGHT pleural effusion. Mild RIGHT basilar atelectasis. No pulmonary infarction identified. No pneumonia. Musculoskeletal: Fracture of the posterior LEFT  sixth rib (image 52/8). Healed fractures of lower posterior RIGHT ribs on image 137. Review of the MIP images confirms the above findings. CTA ABDOMEN AND PELVIS FINDINGS VASCULAR Aorta: Normal caliber aorta without aneurysm, dissection, vasculitis or significant stenosis. Celiac: Patent without evidence of aneurysm, dissection, vasculitis or significant stenosis. SMA: Patent without evidence of aneurysm, dissection, vasculitis or significant stenosis. Renals: Both renal arteries are patent without evidence of aneurysm, dissection, vasculitis,  fibromuscular dysplasia or significant stenosis. IMA: Patent without evidence of aneurysm, dissection, vasculitis or significant stenosis. Inflow: Patent without evidence of aneurysm, dissection, vasculitis or significant stenosis. Veins: No obvious venous abnormality within the limitations of this arterial phase study. Review of the MIP images confirms the above findings. NON-VASCULAR Hepatobiliary: No focal hepatic lesion. Normal gallbladder. No biliary duct dilatation. Common bile duct is normal. Pancreas: Pancreas is normal. No ductal dilatation. No pancreatic inflammation. Spleen: Normal spleen Adrenals/urinary tract: Adrenal glands and kidneys are normal. Bilateral simple fluid attenuation Bosniak 1 renal cysts. No follow-up recommended. The ureters and bladder normal. Stomach/Bowel: Stomach, small bowel, appendix, and cecum are normal. The colon and rectosigmoid colon are normal. Multiple diverticula of the descending colon and sigmoid colon without acute inflammation. Vascular/Lymphatic: Abdominal aorta is normal caliber. No periportal or retroperitoneal adenopathy. No pelvic adenopathy. Reproductive: Other: No free fluid. Musculoskeletal: No aggressive osseous lesion. Intramedullary nail fixation of the RIGHT femur with associated postprocedural inflammatory change. Review of the MIP images confirms the above findings. IMPRESSION: CHEST IMPRESSION: 1. No evidence of thoracic aortic dissection or aneurysm. 2. No pulmonary embolism. 3. Moderate RIGHT pleural effusion with RIGHT basilar atelectasis. 4. Fracture of the posterior LEFT sixth rib is age indeterminate. Fracture of the posterior RIGHT ribs are chronic. PELVIS IMPRESSION: 1. No evidence of abdominal aortic aneurysm or dissection. 2. No acute findings in the abdomen pelvis. 3. LEFT colon diverticulosis without evidence diverticulitis. 4. RIGHT femur internal fixation Electronically Signed   By: Genevive Bi M.D.   On: 11/25/2022 09:44   DG Chest  Port 1 View  Result Date: 11/25/2022 CLINICAL DATA:  Questionable sepsis EXAM: PORTABLE CHEST 1 VIEW COMPARISON:  Chest radiograph dated 09/22/2022 FINDINGS: Low lung volumes with bronchovascular crowding. Right basilar patchy opacities. Trace right pleural effusion. No pneumothorax. Enlarged cardiomediastinal silhouette. Median sternotomy wires are nondisplaced. IMPRESSION: 1. Right basilar patchy opacities, which may represent atelectasis, aspiration, or pneumonia. 2. Trace right pleural effusion. 3. Cardiomegaly. Electronically Signed   By: Agustin Cree M.D.   On: 11/25/2022 09:04    Medications:   Chlorhexidine Gluconate Cloth  6 each Topical Q0600   [START ON 11/27/2022] heparin  3,000 Units Dialysis Once in dialysis   heparin  5,000 Units Subcutaneous Q8H   heparin sodium (porcine)       metoprolol tartrate  25 mg Oral BID    Dialysis Orders: MWF [facility] 4h  400/600  81.2kg    LUA AVF   Heparin 5000 - no esa or vdra - last OP HD 6/12, post wt 80.5kg  - last Hb 11.2, last pth 33    CXR 6/14 - IMPRESSION: 1. Right basilar patchy opacities, which may represent atelectasis, aspiration, or pneumonia. 2. Trace right pleural effusion. 3. Cardiomegaly.      Assessment/ Plan: SVT: On admit, with CP and hypotension. Pt converted in ED spontaneously and BP since improved. Per primary team.  ESRD: Usual MWF schedule - HD now (moved from yesterday) - 2L UFG, see how goes - adjust prn. BP/ volume - 1st BP 70s  in the ED, now back up to 140/70 range w/ NSR. Up 3-4kg per last available weight here, he is too weak to stand for weight today, 2L UFG. 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 Not on ESA as outpatient, monitor here for now. Secondary HPTH: Ca good, Phos pending. Continue home meds. DM2 - on insulin Recent hip Fx s/p ORIF on 09/10/22 Debility - getting rehab in SNF  [place], [place] 11/26/2022, 8:42 AM  [facility]

## 2022-11-26 NOTE — Progress Notes (Signed)
   11/26/22 1227  Vitals  Temp 98.1 F (36.7 C)  Pulse Rate 73  Resp 20  BP (!) 152/68  SpO2 97 %  O2 Device Nasal Cannula  Weight 82.1 kg  Type of Weight Post-Dialysis  Oxygen Therapy  O2 Flow Rate (L/min) 2 L/min  Post Treatment  Dialyzer Clearance Lightly streaked  Duration of HD Treatment -hour(s) 3.5 hour(s)  Hemodialysis Intake (mL) 0 mL  Liters Processed 84  Fluid Removed (mL) 2000 mL  Tolerated HD Treatment Yes  AVG/AVF Arterial Site Held (minutes) 10 minutes  AVG/AVF Venous Site Held (minutes) 10 minutes   Received patient in bed to unit.  Alert and oriented.  Informed consent signed and in chart.   TX duration:3.5  Patient tolerated well.  Transported back to the room  Alert, without acute distress.  Hand-off given to patient's nurse.   Access used: LUAF Access issues: no complications  Total UF removed: 2000 Medication(s) given: none   [facility]

## 2022-11-26 NOTE — Progress Notes (Signed)
Rounding Note    Patient Name: LABRADFORD SAINZ Date of Encounter: 11/26/2022  [facility] Cardiologist: Chilton Si, MD   Subjective   67 year old gentleman who was admitted with chest pain and found to have supraventricular tachycardia.  He converted spontaneously.  He has a history of coronary artery disease and coronary artery bypass grafting.  Inpatient Medications    Scheduled Meds:  amiodarone  200 mg Oral Daily   calcium acetate  667 mg Oral TID WC   Chlorhexidine Gluconate Cloth  6 each Topical Q0600   heparin  5,000 Units Subcutaneous Q8H   heparin sodium (porcine)       insulin aspart  0-9 Units Subcutaneous TID WC   metoprolol tartrate  25 mg Oral BID   [START ON 11/27/2022] polyethylene glycol  17 g Oral Daily   [START ON 11/27/2022] sertraline  100 mg Oral Daily   Continuous Infusions:  PRN Meds: acetaminophen, albuterol, heparin sodium (porcine)   Vital Signs    Vitals:   11/26/22 1200 11/26/22 1217 11/26/22 1227 11/26/22 1303  BP: (!) 164/77 (!) 161/85 (!) 152/68 (!) 151/70  Pulse: 75 72 73 74  Resp: 19 15 20 15   Temp:   98.1 F (36.7 C) 98.2 F (36.8 C)  TempSrc:    Oral  SpO2: 98% 97% 97% 98%  Weight:   82.1 kg     Intake/Output Summary (Last 24 hours) at 11/26/2022 1446 Last data filed at 11/26/2022 1227 Gross per 24 hour  Intake 791.65 ml  Output 2180 ml  Net -1388.35 ml      11/26/2022   12:27 PM 11/26/2022    8:28 AM 11/26/2022    4:50 AM  Last 3 Weights  Weight (lbs) 181 lb 186 lb 1.1 oz 184 lb 1.4 oz  Weight (kg) 82.1 kg 84.4 kg 83.5 kg      Telemetry    NSR  - Personally Reviewed  ECG     - Personally Reviewed  Physical Exam   GEN: No acute distress.   Neck: No JVD Cardiac: RRR, no murmurs, rubs, or gallops.  Respiratory: Clear to auscultation bilaterally. GI: Soft, nontender, non-distended  MS: No edema; No deformity. Neuro:  Nonfocal  Psych: Normal affect   Labs    High Sensitivity Troponin:    Recent Labs  Lab 11/25/22 1104 11/25/22 1414 11/25/22 1551 11/25/22 1741 11/25/22 1916  TROPONINIHS 155* 463* 553* 608* 595*     Chemistry Recent Labs  Lab 11/25/22 0817 11/25/22 0846 11/25/22 0853 11/25/22 1414 11/26/22 0040  NA 137 136 138  --  133*  K 4.0 4.2 3.5  --  3.8  CL 98 101  --   --  95*  CO2 24  --   --   --  25  GLUCOSE 128* 133*  --   --  203*  BUN 35* 37*  --   --  36*  CREATININE 4.39* 4.60*  --   --  4.76*  CALCIUM 8.6*  --   --   --  8.4*  MG  --   --   --  2.0  --   PROT 6.5  --   --   --   --   ALBUMIN 3.1*  --   --   --   --   AST 12*  --   --   --   --   ALT 8  --   --   --   --  ALKPHOS 96  --   --   --   --   BILITOT 0.8  --   --   --   --   GFRNONAA 14*  --   --   --  13*  ANIONGAP 15  --   --   --  13    Lipids No results for input(s): "CHOL", "TRIG", "HDL", "LABVLDL", "LDLCALC", "CHOLHDL" in the last 168 hours.  Hematology Recent Labs  Lab 11/25/22 0817 11/25/22 0846 11/25/22 0853 11/26/22 0040  WBC 8.5  --   --  6.1  RBC 4.18*  --   --  4.12*  HGB 10.7* 11.9* 10.5* 10.5*  HCT 35.9* 35.0* 31.0* 34.7*  MCV 85.9  --   --  84.2  MCH 25.6*  --   --  25.5*  MCHC 29.8*  --   --  30.3  RDW 16.9*  --   --  16.9*  PLT 290  --   --  274   Thyroid No results for input(s): "TSH", "FREET4" in the last 168 hours.  BNPNo results for input(s): "BNP", "PROBNP" in the last 168 hours.  DDimer No results for input(s): "DDIMER" in the last 168 hours.   Radiology    CT Angio Chest/Abd/Pel for Dissection W and/or Wo Contrast  Result Date: 11/25/2022 CLINICAL DATA:  Concern for thoracic aortic dissection.  Chest pain. EXAM: CT ANGIOGRAPHY CHEST, ABDOMEN AND PELVIS TECHNIQUE: Non-contrast CT of the chest was initially obtained. Multidetector CT imaging through the chest, abdomen and pelvis was performed using the standard protocol during bolus administration of intravenous contrast. Multiplanar reconstructed images and MIPs were obtained and reviewed  to evaluate the vascular anatomy. RADIATION DOSE REDUCTION: This exam was performed according to the departmental dose-optimization program which includes automated exposure control, adjustment of the mA and/or kV according to patient size and/or use of iterative reconstruction technique. CONTRAST:  OMNIPAQUE IOHEXOL 350 MG/ML SOLN COMPARISON:  None Available. FINDINGS: CTA CHEST FINDINGS Cardiovascular: Noncontrast series demonstrates no intramural hematoma within the thoracic aorta. Contrast enhanced imaging demonstrates no dissection or aneurysm of the thoracic aorta. Post CABG anatomy. No pericardial fluid. No pulmonary embolism. Mediastinum/Nodes: No axillary or supraclavicular adenopathy. No mediastinal or hilar adenopathy. No pericardial fluid. Esophagus normal. Lungs/Pleura: Moderate layering RIGHT pleural effusion. Mild RIGHT basilar atelectasis. No pulmonary infarction identified. No pneumonia. Musculoskeletal: Fracture of the posterior LEFT sixth rib (image 52/8). Healed fractures of lower posterior RIGHT ribs on image 137. Review of the MIP images confirms the above findings. CTA ABDOMEN AND PELVIS FINDINGS VASCULAR Aorta: Normal caliber aorta without aneurysm, dissection, vasculitis or significant stenosis. Celiac: Patent without evidence of aneurysm, dissection, vasculitis or significant stenosis. SMA: Patent without evidence of aneurysm, dissection, vasculitis or significant stenosis. Renals: Both renal arteries are patent without evidence of aneurysm, dissection, vasculitis, fibromuscular dysplasia or significant stenosis. IMA: Patent without evidence of aneurysm, dissection, vasculitis or significant stenosis. Inflow: Patent without evidence of aneurysm, dissection, vasculitis or significant stenosis. Veins: No obvious venous abnormality within the limitations of this arterial phase study. Review of the MIP images confirms the above findings. NON-VASCULAR Hepatobiliary: No focal hepatic  lesion. Normal gallbladder. No biliary duct dilatation. Common bile duct is normal. Pancreas: Pancreas is normal. No ductal dilatation. No pancreatic inflammation. Spleen: Normal spleen Adrenals/urinary tract: Adrenal glands and kidneys are normal. Bilateral simple fluid attenuation Bosniak 1 renal cysts. No follow-up recommended. The ureters and bladder normal. Stomach/Bowel: Stomach, small bowel, appendix, and cecum are normal. The colon and  rectosigmoid colon are normal. Multiple diverticula of the descending colon and sigmoid colon without acute inflammation. Vascular/Lymphatic: Abdominal aorta is normal caliber. No periportal or retroperitoneal adenopathy. No pelvic adenopathy. Reproductive: Other: No free fluid. Musculoskeletal: No aggressive osseous lesion. Intramedullary nail fixation of the RIGHT femur with associated postprocedural inflammatory change. Review of the MIP images confirms the above findings. IMPRESSION: CHEST IMPRESSION: 1. No evidence of thoracic aortic dissection or aneurysm. 2. No pulmonary embolism. 3. Moderate RIGHT pleural effusion with RIGHT basilar atelectasis. 4. Fracture of the posterior LEFT sixth rib is age indeterminate. Fracture of the posterior RIGHT ribs are chronic. PELVIS IMPRESSION: 1. No evidence of abdominal aortic aneurysm or dissection. 2. No acute findings in the abdomen pelvis. 3. LEFT colon diverticulosis without evidence diverticulitis. 4. RIGHT femur internal fixation Electronically Signed   By: Genevive Bi M.D.   On: 11/25/2022 09:44   DG Chest Port 1 View  Result Date: 11/25/2022 CLINICAL DATA:  Questionable sepsis EXAM: PORTABLE CHEST 1 VIEW COMPARISON:  Chest radiograph dated 09/22/2022 FINDINGS: Low lung volumes with bronchovascular crowding. Right basilar patchy opacities. Trace right pleural effusion. No pneumothorax. Enlarged cardiomediastinal silhouette. Median sternotomy wires are nondisplaced. IMPRESSION: 1. Right basilar patchy opacities, which  may represent atelectasis, aspiration, or pneumonia. 2. Trace right pleural effusion. 3. Cardiomegaly. Electronically Signed   By: Agustin Cree M.D.   On: 11/25/2022 09:04    Cardiac Studies      Patient Profile     67 y.o. male    Assessment & Plan     1.  SVT :   agree with Dr. Tenny Craw To continue amiodarone for now .   He remains stable   Continue for now. I'll see if EP can see in the am. Otherwise, EP can see as OP .          For questions or updates, please contact [facility] Please consult [email or web address] for contact info under        Signed, Kristeen Miss, MD  11/26/2022, 2:46 PM

## 2022-11-26 NOTE — Assessment & Plan Note (Addendum)
Receives outpatient HD MWF. Still makes urine. Last dialysis session yesterday 6/15. Cr improved 3.40 (7.76), BUN 22 (36). -Nephrology following, appreciate recommendations -AM BMP

## 2022-11-26 NOTE — Progress Notes (Signed)
PT Cancellation Note  Patient Details Name: KYAL SABHARWAL MRN: [id number] DOB: September 04, 1955   Cancelled Treatment:    Reason Eval/Treat Not Completed: Patient at procedure or test/unavailable. Pt off of the floor for HD. PT will continue to follow-up with pt acutely as available and appropriate.   Alessandra Bevels Teddrick Mallari 11/26/2022, 8:02 AM

## 2022-11-26 NOTE — Assessment & Plan Note (Addendum)
Troponins peaked to 608 and flattened at 595.  Cardiology is advised that this is likely related to demand ischemia.  See further workup above.

## 2022-11-26 NOTE — Progress Notes (Signed)
2D echo attempted, patient in dialysis. Will try later 

## 2022-11-26 NOTE — Assessment & Plan Note (Signed)
Receives outpatient HD MWF. Still makes urine. Last dialysis 6/12.  -Nephrology following, appreciate recommendations

## 2022-11-26 NOTE — Hospital Course (Signed)
[name] is a 67 y.o.male with a history of CAD status post CABG, HTN, T2DM, ESRD on HD, systolic and diastolic CHF, esophageal adenocarcinoma, chronic respiratory failure on 3 L [place], A-fib, PE who was admitted to the [facility] Teaching Service at [facility] for acute chest pain. His hospital course is detailed below:  SVT, demand ischemia Presented with chest pain, tachycardia, hypotension with initial EKG representing SVT although spontaneously converted in the ED.  Cardiology consulted.  Troponins were trended to peak at 600 and suspect be related to demand ischemia.***  Other chronic conditions were medically managed with home medications and formulary alternatives as necessary (ESRD on HD, T2DM, chronic respiratory failure, hip fracture, esophageal adenocarcinoma)  PCP Follow-up Recommendations:

## 2022-11-26 NOTE — Progress Notes (Addendum)
Daily Progress Note Intern Pager: [phone]  Patient name: [name] Medical record number: [id number] Date of birth: 1956-01-20 Age: 67 y.o. Gender: male  Primary Care Provider: Babs Sciara, MD Consultants: Cardiology, nephrology Code Status: DNR  Pt Overview and Major Events to Date:  6/14: Admitted  Assessment and Plan: [name] is a 67 y.o. male who presented with acute chest pain, palpitations, tachycardia, hypotension that woke him from his sleep who upon arrival to the ED had spontaneously converted and stabilized.  His troponins are elevated suspect to be in the setting of demand ischemia is currently receiving workup with assistance by cardiology. Pertinent PMH/PSH includes CAD status post CABG, HTN, T2DM, systolic and diastolic CHF, esophageal adenocarcinoma, chronic respiratory failure on 3 L [place], A-fib, PE.  Hospital Problem List      Hospital     * (Principal) SVT (supraventricular tachycardia)     Now in NSR, asymptomatic, tachycardia and hypotension have resolved -Cardiology following, appreciate recommendations -Remains on metoprolol tartrate 25 mg twice daily, not restarted on  amiodarone        Elevated troponin     Troponins peaked to 608 and flattened at 595.  Cardiology is advised  that this is likely related to demand ischemia.  See further workup above.        Type 2 diabetes mellitus (HCC)     CBGs remain appropriate ranging between 120s to 200s. -Initiate sensitive SSI        ESRD (end stage renal disease) on dialysis [facility])     Receives outpatient HD MWF. Still makes urine. Last dialysis 6/12.  Receiving HD today. -Nephrology following, appreciate recommendations -AM BMP        Pleural effusion     Noted on CTA chest/abd/pelvis.  Likely chronic issue but remains on his  home O2.  Would not intervene at this time.     Chronic, stable conditions: Chronic respiratory failure: On 3 L [place] at baseline Hip fracture: Status post  internal fixation 09/10/2022 Esophageal adenocarcinoma: Follows with [facility], dysphagia 2 diet  FEN/GI: Dysphagia 2 diet as recommended by SLP PPx: Heparin subcu Dispo: pending clinical improvement . Barriers include further workup.   Subjective:  Patient notes he feels well back to normal denies chest pain, palpitations, chest pressure.  Does not have any concerns or complaints today.  Objective: Temp:  [97.8 F (36.6 C)-98.7 F (37.1 C)] 98.2 F (36.8 C) (06/15 1303) Pulse Rate:  [69-85] 74 (06/15 1303) Resp:  [11-20] 15 (06/15 1303) BP: (135-164)/(68-85) 151/70 (06/15 1303) SpO2:  [90 %-100 %] 98 % (06/15 1303) Weight:  [82.1 kg-84.4 kg] 82.1 kg (06/15 1227) Physical Exam: General: Well-appearing, NAD receiving HD Cardiovascular: RRR, no murmurs auscultated incorre 3/6 systolic murmur Respiratory: CTAB, normal WOB Abdomen: Soft, nontender, normoactive bowel sounds  Laboratory: Most recent CBC Lab Results  Component Value Date   WBC 6.1 11/26/2022   HGB 10.5 (L) 11/26/2022   HCT 34.7 (L) 11/26/2022   MCV 84.2 11/26/2022   PLT 274 11/26/2022   Most recent BMP    Latest Ref Rng & Units 11/26/2022   12:40 AM  BMP  Glucose 70 - 99 mg/dL 147   BUN 8 - 23 mg/dL 36   Creatinine 8.29 - 1.24 mg/dL 5.62   Sodium 130 - 865 mmol/L 133   Potassium 3.5 - 5.1 mmol/L 3.8   Chloride 98 - 111 mmol/L 95   CO2 22 - 32 mmol/L 25  Calcium 8.9 - 10.3 mg/dL 8.4    CBG (last 3)  Recent Labs    11/25/22 2108 11/26/22 0613 11/26/22 1259  GLUCAP 190* 168* 129*   Imaging/Diagnostic Tests: No new imaging results Shelby Mattocks, DO 11/26/2022, 2:21 PM  PGY-2, [facility] FPTS Intern pager: [phone], text pages welcome Secure chat group [facility] Teaching [facility]

## 2022-11-26 NOTE — Assessment & Plan Note (Signed)
CBGs remain appropriate ranging between 120s to 200s. -Initiate sensitive SSI

## 2022-11-26 NOTE — Assessment & Plan Note (Addendum)
Now in NSR, asymptomatic, tachycardia and hypotension have resolved -Cardiology following, appreciate recommendations -Remains on metoprolol tartrate 25 mg twice daily, not restarted on amiodarone

## 2022-11-26 NOTE — Plan of Care (Signed)
  Problem: Education: Goal: Knowledge of General Education information will improve Description: Including pain rating scale, medication(s)/side effects and non-pharmacologic comfort measures Outcome: Progressing   Problem: Coping: Goal: Level of anxiety will decrease Outcome: Progressing   

## 2022-11-26 NOTE — Evaluation (Signed)
Occupational Therapy Evaluation Patient Details Name: [name] MRN: [id number] DOB: 11/19/1955 Today's Date: 11/26/2022   History of Present Illness Pt is a 67 y/o M presenting to ED on 6/14 from SNF wtih chest and abdominal pain, admitted for SVT. PMH includes anemia, arthritis, CAD, CHF, THN, sleep apnea, Stroke, R eye vision loss, DM2, R hip ORIF 09/10/2022.   Clinical Impression   Pt reports at SNF he was ambulating short distances with RW and chair follow, was ind with ADLs. Pt currently needing min-max A for ADLs, mod A for bed mobility, and mod -max A for stand pivot transfer with use of RW. Pt unable to weight shift to RLE in standing and has L lateral lean in sitting to offload R hip. Pt presenting with impairments listed below, will follow acutely. Patient will benefit from continued inpatient follow up therapy, <3 hours/day to maximize safety/ind with ADLs/functional mobility.      Recommendations for follow up therapy are one component of a multi-disciplinary discharge planning process, led by the attending physician.  Recommendations may be updated based on patient status, additional functional criteria and insurance authorization.   Assistance Recommended at Discharge Frequent or constant Supervision/Assistance  Patient can return home with the following A lot of help with walking and/or transfers;A lot of help with bathing/dressing/bathroom;Assistance with cooking/housework;Direct supervision/assist for medications management;Assist for transportation;Direct supervision/assist for financial management;Help with stairs or ramp for entrance    Functional Status Assessment  Patient has had a recent decline in their functional status and demonstrates the ability to make significant improvements in function in a reasonable and predictable amount of time.  Equipment Recommendations  Other (comment) (defer)    Recommendations for Other Services PT consult     Precautions /  Restrictions Precautions Precautions: Fall Restrictions Weight Bearing Restrictions: Yes RLE Weight Bearing: Weight bearing as tolerated      Mobility Bed Mobility Overal bed mobility: Needs Assistance Bed Mobility: Supine to Sit     Supine to sit: Mod assist     General bed mobility comments: for trunk elevation and to assist RLE    Transfers Overall transfer level: Needs assistance Equipment used: Rolling walker (2 wheels) Transfers: Bed to chair/wheelchair/BSC   Stand pivot transfers: Mod assist, Max assist         General transfer comment: pt unable to weightshift to RLE in order to step reaches for chair and pivots self vs stepping for transfer      Balance Overall balance assessment: Needs assistance Sitting-balance support: Feet supported Sitting balance-Leahy Scale: Fair Sitting balance - Comments: leans L to offload R hip in sitting   Standing balance support: Bilateral upper extremity supported, During functional activity, Reliant on assistive device for balance Standing balance-Leahy Scale: Poor Standing balance comment: reliant on external support                           ADL either performed or assessed with clinical judgement   ADL Overall ADL's : Needs assistance/impaired Eating/Feeding: Set up;Sitting   Grooming: Minimal assistance;Sitting;Bed level   Upper Body Bathing: Minimal assistance;Sitting;Bed level   Lower Body Bathing: Maximal assistance;Sit to/from stand   Upper Body Dressing : Minimal assistance;Sitting;Bed level   Lower Body Dressing: Maximal assistance   Toilet Transfer: Maximal assistance;Moderate assistance;Stand-pivot;Rolling walker (2 wheels);BSC/3in1   Toileting- Clothing Manipulation and Hygiene: Maximal assistance       Functional mobility during ADLs: Maximal assistance;Moderate assistance  Vision   Additional Comments: R eye vision loss at baseline per chart review     Perception  Perception Perception Tested?: No   Praxis Praxis Praxis tested?: Not tested    Pertinent Vitals/Pain Pain Assessment Pain Assessment: Faces Pain Score: 7  Faces Pain Scale: Hurts even more Pain Location: RLE above knee Pain Descriptors / Indicators: Discomfort Pain Intervention(s): Limited activity within patient's tolerance, Monitored during session, Repositioned     Hand Dominance Right   Extremity/Trunk Assessment Upper Extremity Assessment Upper Extremity Assessment: Generalized weakness   Lower Extremity Assessment Lower Extremity Assessment: Defer to PT evaluation   Cervical / Trunk Assessment Cervical / Trunk Assessment: Normal   Communication Communication Communication: No difficulties   Cognition Arousal/Alertness: Awake/alert Behavior During Therapy: WFL for tasks assessed/performed Overall Cognitive Status: Within Functional Limits for tasks assessed                                       General Comments  VSS on 2L O2    Exercises     Shoulder Instructions      Home Living Family/patient expects to be discharged to:: Skilled nursing facility Living Arrangements: Alone Available Help at Discharge: Family;Friend(s);Personal care attendant;Available PRN/intermittently Type of Home: House Home Access: Stairs to enter [employer] of Steps: 6 Entrance Stairs-Rails: Right Home Layout: Multi-level Alternate Level Stairs-Number of Steps: 6 + 6.  Patient stating he is now entering the home via a sliding glass door in the rear of the home, and living on this level.  No longer needs to climb stairs for bed/bath. Alternate Level Stairs-Rails: Left Bathroom Shower/Tub: Sponge bathes at baseline   Bathroom Toilet: Standard Bathroom Accessibility: Yes   Home Equipment: Grab bars - tub/shower;Rolling Walker (2 wheels);Cane - quad;Wheelchair - manual   Additional Comments: wears 2L O2 baseline      Prior Functioning/Environment  Prior Level of Function : Needs assist;History of Falls (last six months)             Mobility Comments: reports he took 85 steps at SNF with RW ADLs Comments: ind        OT Problem List: Decreased strength;Decreased range of motion;Decreased activity tolerance;Impaired balance (sitting and/or standing);Decreased safety awareness;Pain      OT Treatment/Interventions: Therapeutic exercise;Self-care/ADL training;Energy conservation;DME and/or AE instruction;Therapeutic activities;Patient/family education;Balance training    OT Goals(Current goals can be found in the care plan section) Acute Rehab OT Goals Patient Stated Goal: none stated OT Goal Formulation: With patient Time For Goal Achievement: 12/10/22 Potential to Achieve Goals: Good ADL Goals Pt Will Perform Upper Body Dressing: sitting;with supervision Pt Will Perform Lower Body Dressing: with min assist;sit to/from stand;sitting/lateral leans;with adaptive equipment Pt Will Transfer to Toilet: with min assist;stand pivot transfer;squat pivot transfer;bedside commode Additional ADL Goal #1: Pt will perform bed mobility min guard A in prep for ADLs  OT Frequency: Min 1X/week    Co-evaluation              AM-PAC OT "6 Clicks" Daily Activity     Outcome Measure Help from another person eating meals?: None Help from another person taking care of personal grooming?: A Little Help from another person toileting, which includes using toliet, bedpan, or urinal?: A Lot Help from another person bathing (including washing, rinsing, drying)?: A Lot Help from another person to put on and taking off regular upper body clothing?: A Little Help from  another person to put on and taking off regular lower body clothing?: A Lot 6 Click Score: 16   End of Session Equipment Utilized During Treatment: Gait belt;Rolling walker (2 wheels);Oxygen (2L) Nurse Communication: Mobility status  Activity Tolerance: Patient tolerated treatment  well Patient left: with call bell/phone within reach;in chair;with chair alarm set  OT Visit Diagnosis: Unsteadiness on feet (R26.81);Other abnormalities of gait and mobility (R26.89);Muscle weakness (generalized) (M62.81)                Time: 4098-1191 OT Time Calculation (min): 33 min Charges:  OT General Charges $OT Visit: 1 Visit OT Evaluation $OT Eval Moderate Complexity: 1 Mod OT Treatments $Therapeutic Activity: 8-22 mins  Carver Fila, OTD, OTR/L SecureChat Preferred Acute Rehab [phone]   Carver Fila Koonce 11/26/2022, 3:41 PM

## 2022-11-26 NOTE — Assessment & Plan Note (Signed)
Noted on CTA chest/abd/pelvis.  Likely chronic issue but remains on his home O2.  Would not intervene at this time.

## 2022-11-27 DIAGNOSIS — I471 Supraventricular tachycardia, unspecified: Secondary | ICD-10-CM | POA: Diagnosis not present

## 2022-11-27 LAB — BLOOD CULTURE ID PANEL (REFLEXED) - BCID2

## 2022-11-27 LAB — BASIC METABOLIC PANEL
Anion gap: 13 (ref 5–15)
BUN: 22 mg/dL (ref 8–23)
CO2: 26 mmol/L (ref 22–32)
Calcium: 8.5 mg/dL — ABNORMAL LOW (ref 8.9–10.3)
Chloride: 96 mmol/L — ABNORMAL LOW (ref 98–111)
Creatinine, Ser: 3.4 mg/dL — ABNORMAL HIGH (ref 0.61–1.24)
GFR, Estimated: 19 mL/min — ABNORMAL LOW (ref >60)
Glucose, Bld: 183 mg/dL — ABNORMAL HIGH (ref 70–99)
Potassium: 3.6 mmol/L (ref 3.5–5.1)
Sodium: 135 mmol/L (ref 135–145)

## 2022-11-27 LAB — GLUCOSE, CAPILLARY
Glucose-Capillary: 156 mg/dL — ABNORMAL HIGH (ref 70–99)
Glucose-Capillary: 134 mg/dL — ABNORMAL HIGH (ref 70–99)
Glucose-Capillary: 145 mg/dL — ABNORMAL HIGH (ref 70–99)
Glucose-Capillary: 113 mg/dL — ABNORMAL HIGH (ref 70–99)

## 2022-11-27 LAB — CULTURE, BLOOD (ROUTINE X 2)
Culture: NO GROWTH
Special Requests: ADEQUATE

## 2022-11-27 MED ORDER — LIDOCAINE 5 % EX PTCH
1.0000 | MEDICATED_PATCH | CUTANEOUS | Status: DC
  Administered 2022-11-27 – 2022-11-29 (×3): 1 via TRANSDERMAL
  Filled 2022-11-27 (×3): qty 1

## 2022-11-27 NOTE — Evaluation (Signed)
Physical Therapy Evaluation Patient Details Name: [name] MRN: [id number] DOB: [birth year] Today's Date: 11/27/2022  History of Present Illness  Pt is a 67 y/o M presenting to ED on 6/14 from SNF wtih chest and abdominal pain, admitted for SVT. PMH includes anemia, arthritis, CAD, CHF, THN, sleep apnea, Stroke, R eye vision loss, DM2, R hip ORIF 09/10/2022.  Clinical Impression  Pt presents with admitting diagnosis above. Pt presents from SNF where he states that he has been for 4 weeks following a R hip fx. Today, pt required Mod/Max A to stand with RW and Max A to stand pivot transfer to/from [facility]. Gait deferred for safety as pt states that he is unable to take a step with RLE due to pain. Recommend pt return to SNF upon DC. PT will continue to follow.       Recommendations for follow up therapy are one component of a multi-disciplinary discharge planning process, led by the attending physician.  Recommendations may be updated based on patient status, additional functional criteria and insurance authorization.  Follow Up Recommendations Can patient physically be transported by private vehicle: No     Assistance Recommended at Discharge Frequent or constant Supervision/Assistance  Patient can return home with the following  Two people to help with walking and/or transfers;A lot of help with bathing/dressing/bathroom;Assistance with cooking/housework;Direct supervision/assist for medications management;Assist for transportation;Help with stairs or ramp for entrance    Equipment Recommendations Other (comment) (Per accepting facility)  Recommendations for Other Services       Functional Status Assessment Patient has had a recent decline in their functional status and demonstrates the ability to make significant improvements in function in a reasonable and predictable amount of time.     Precautions / Restrictions Precautions Precautions: Fall Restrictions Weight Bearing  Restrictions: Yes RLE Weight Bearing: Weight bearing as tolerated      Mobility  Bed Mobility Overal bed mobility: Needs Assistance Bed Mobility: Supine to Sit, Sit to Supine     Supine to sit: Min guard Sit to supine: Supervision   General bed mobility comments: Increased time but able to perform on her own    Transfers Overall transfer level: Needs assistance Equipment used: Rolling walker (2 wheels) Transfers: Sit to/from Stand, Bed to chair/wheelchair/BSC Sit to Stand: Mod assist, Max assist Stand pivot transfers: Max assist         General transfer comment: pt unable to weightshift to RLE in order to step reaches for [facility] and pivots self vs stepping for transfer. Pt states that he is unable to take step with RLE.    Ambulation/Gait               General Gait Details: Deferred for safety  Stairs            Wheelchair Mobility    Modified Rankin (Stroke Patients Only)       Balance Overall balance assessment: Needs assistance Sitting-balance support: Feet supported Sitting balance-Leahy Scale: Fair Sitting balance - Comments: leans L to offload R hip in sitting   Standing balance support: Bilateral upper extremity supported, During functional activity, Reliant on assistive device for balance Standing balance-Leahy Scale: Poor Standing balance comment: reliant on external support                             Pertinent Vitals/Pain Pain Assessment Pain Assessment: 0-10 Pain Score: 6  Pain Location: R hip Pain Descriptors / Indicators: Discomfort Pain  Intervention(s): Monitored during session    Home Living Family/patient expects to be discharged to:: Skilled nursing facility Living Arrangements: Alone Available Help at Discharge: Family;Friend(s);Personal care attendant;Available PRN/intermittently Type of Home: House Home Access: Stairs to enter Entrance Stairs-Rails: Right Entrance Stairs-Number of Steps: 6 Alternate Level  Stairs-Number of Steps: 6 + 6.  Patient stating he is now entering the home via a sliding glass door in the rear of the home, and living on this level.  No longer needs to climb stairs for bed/bath. Home Layout: Multi-level Home Equipment: Grab bars - tub/shower;Rolling Walker (2 wheels);Cane - quad;Wheelchair - manual Additional Comments: wears 2L O2 baseline    Prior Function Prior Level of Function : Needs assist;History of Falls (last six months) (3-4 falls in the last 6 months. 1 fall resulting in hip fx. Pt also reports 1 fall at rehab.)             Mobility Comments: Pt reports took about 90 steps at SNF with RW on thursday or friday. ADLs Comments: ind     Hand Dominance   Dominant Hand: Right    Extremity/Trunk Assessment   Upper Extremity Assessment Upper Extremity Assessment: Generalized weakness    Lower Extremity Assessment Lower Extremity Assessment: RLE deficits/detail;Generalized weakness RLE Deficits / Details: R hip fx    Cervical / Trunk Assessment Cervical / Trunk Assessment: Normal  Communication   Communication: No difficulties  Cognition Arousal/Alertness: Awake/alert Behavior During Therapy: WFL for tasks assessed/performed Overall Cognitive Status: Within Functional Limits for tasks assessed                                          General Comments General comments (skin integrity, edema, etc.): VSS on 2L    Exercises     Assessment/Plan    PT Assessment Patient needs continued PT services  PT Problem List Decreased strength;Decreased range of motion;Decreased activity tolerance;Decreased balance;Decreased mobility;Decreased coordination;Decreased knowledge of use of DME;Decreased safety awareness;Cardiopulmonary status limiting activity;Pain       PT Treatment Interventions DME instruction;Gait training;Stair training;Functional mobility training;Therapeutic activities;Therapeutic exercise;Balance training;Neuromuscular  re-education;Patient/family education;Wheelchair mobility training    PT Goals (Current goals can be found in the Care Plan section)  Acute Rehab PT Goals Patient Stated Goal: to get out of here PT Goal Formulation: With patient Time For Goal Achievement: 12/11/22 Potential to Achieve Goals: Fair    Frequency Min 1X/week     Co-evaluation               AM-PAC PT "6 Clicks" Mobility  Outcome Measure Help needed turning from your back to your side while in a flat bed without using bedrails?: A Little Help needed moving from lying on your back to sitting on the side of a flat bed without using bedrails?: A Little Help needed moving to and from a bed to a chair (including a wheelchair)?: A Lot Help needed standing up from a chair using your arms (e.g., wheelchair or bedside chair)?: A Lot Help needed to walk in hospital room?: Total Help needed climbing 3-5 steps with a railing? : Total 6 Click Score: 12    End of Session Equipment Utilized During Treatment: Gait belt;Oxygen Activity Tolerance: Patient limited by pain;Patient limited by fatigue Patient left: in bed;with call bell/phone within reach;with bed alarm set Nurse Communication: Mobility status PT Visit Diagnosis: Other abnormalities of gait and mobility (R26.89);History of  falling (Z91.81)    Time: 4098-1191 PT Time Calculation (min) (ACUTE ONLY): 36 min   Charges:   PT Evaluation $PT Eval Moderate Complexity: 1 Mod PT Treatments $Therapeutic Activity: 8-22 mins        Shela Nevin, PT, DPT Acute Rehab Services [phone]   Gladys Damme 11/27/2022, 1:53 PM

## 2022-11-27 NOTE — Progress Notes (Signed)
[facility] Progress Note   Subjective:  Seen in room - feels well today, off O2. Reports may be discharged today. BP high, getting meds now. Looks like Bcx 6/14 - 1 of 4+, felt likely contaminant.  Objective Vitals:   11/26/22 2012 11/27/22 0226 11/27/22 0401 11/27/22 0846  BP: (!) 162/88 (!) 163/79 (!) 172/83 (!) 173/83  Pulse: 72 70 72 83  Resp: 19 19 20  (!) 23  Temp: (!) 97.4 F (36.3 C) 98.1 F (36.7 C) 97.9 F (36.6 C) 97.8 F (36.6 C)  TempSrc: Oral Oral Oral   SpO2: 96% 90% 96% 95%  Weight:   82.6 kg    Physical Exam General: Chronically ill appearing man,  room air. Heart: RRR; no murmur Lungs: CTA anteriorly Abdomen: soft Extremities: no LE edema Dialysis Access: LUE AVF + bruit  Additional Objective Labs: Basic Metabolic Panel: Recent Labs  Lab 11/25/22 0817 11/25/22 0846 11/25/22 0853 11/26/22 0040 11/27/22 0058  NA 137 136 138 133* 135  K 4.0 4.2 3.5 3.8 3.6  CL 98 101  --  95* 96*  CO2 24  --   --  25 26  GLUCOSE 128* 133*  --  203* 183*  BUN 35* 37*  --  36* 22  CREATININE 4.39* 4.60*  --  4.76* 3.40*  CALCIUM 8.6*  --   --  8.4* 8.5*   Liver Function Tests: Recent Labs  Lab 11/25/22 0817  AST 12*  ALT 8  ALKPHOS 96  BILITOT 0.8  PROT 6.5  ALBUMIN 3.1*   CBC: Recent Labs  Lab 11/25/22 0817 11/25/22 0846 11/25/22 0853 11/26/22 0040  WBC 8.5  --   --  6.1  NEUTROABS 6.2  --   --   --   HGB 10.7* 11.9* 10.5* 10.5*  HCT 35.9* 35.0* 31.0* 34.7*  MCV 85.9  --   --  84.2  PLT 290  --   --  274   Blood Culture    Component Value Date/Time   SDES BLOOD RIGHT ARM 11/25/2022 0822   SPECREQUEST  11/25/2022 0822    BOTTLES DRAWN AEROBIC AND ANAEROBIC Blood Culture adequate volume   CULT GRAM POSITIVE COCCI 11/25/2022 0822   REPTSTATUS PENDING 11/25/2022 4098   Studies/Results: ECHOCARDIOGRAM LIMITED  Result Date: 11/26/2022    ECHOCARDIOGRAM LIMITED REPORT   Patient Name:   [name] Date of Exam: 11/26/2022  Medical Rec #:  [id number]        Height:       70.0 in Accession #:    [id number]       Weight:       181.0 lb Date of Birth:  [birth year]        BSA:          2.001 m Patient Age:    67 years         BP:           151/70 mmHg Patient Gender: M                HR:           72 bpm. Exam Location:  Inpatient Procedure: Limited Echo and Limited Color Doppler Indications:    Tricuspid Valve Disease 107.9  History:        Patient has prior history of Echocardiogram examinations, most                 recent 08/18/2022. Cardiomyopathy and CHF, CAD and Angina, Prior  CABG, Arrythmias:Tachycardia, Signs/Symptoms:Chest Pain and                 Syncope; Risk Factors:Hypertension, Sleep Apnea, Dyslipidemia                 and Diabetes. ESRD.  Sonographer:    Lucendia Herrlich Referring Phys: [id number] Perlie Gold  Sonographer Comments: Image acquisition challenging due to patient body habitus. IMPRESSIONS  1. Left ventricular ejection fraction, by estimation, is 50 to 55%. The left ventricle has low normal function.  2. Right ventricular systolic function is normal. The right ventricular size is not well visualized. There is normal pulmonary artery systolic pressure. The estimated right ventricular systolic pressure is 35.5 mmHg.  3. No evidence of mitral valve regurgitation.  4. Mild leaflet thickening/calcification. Tricuspid valve regurgitation is mild.  5. Aortic valve regurgitation is not visualized.  6. The inferior vena cava is dilated in size with >50% respiratory variability, suggesting right atrial pressure of 8 mmHg. FINDINGS  Left Ventricle: Left ventricular ejection fraction, by estimation, is 50 to 55%. The left ventricle has low normal function. Abnormal (paradoxical) septal motion consistent with post-operative status. Right Ventricle: The right ventricular size is not well visualized. Right ventricular systolic function is normal. There is normal pulmonary artery systolic pressure. The tricuspid  regurgitant velocity is 2.62 m/s, and with an assumed right atrial pressure of 8 mmHg, the estimated right ventricular systolic pressure is 35.5 mmHg. Tricuspid Valve: Mild leaflet thickening/calcification. The tricuspid valve is grossly normal. Tricuspid valve regurgitation is mild. Aortic Valve: Aortic valve regurgitation is not visualized. Venous: The inferior vena cava is dilated in size with greater than 50% respiratory variability, suggesting right atrial pressure of 8 mmHg. LEFT VENTRICLE PLAX 2D LVIDd:         5.30 cm LVIDs:         3.85 cm LV PW:         1.30 cm LV IVS:        1.00 cm  IVC IVC diam: 2.90 cm LEFT ATRIUM         Index LA diam:    5.00 cm 2.50 cm/m  TRICUSPID VALVE TR Peak grad:   27.5 mmHg TR Vmax:        262.00 cm/s Carolan Clines Electronically signed by Carolan Clines Signature Date/Time: 11/26/2022/5:09:42 PM    Final    Medications:   amiodarone  200 mg Oral Daily   calcium acetate  667 mg Oral TID WC   Chlorhexidine Gluconate Cloth  6 each Topical Q0600   heparin  5,000 Units Subcutaneous Q8H   insulin aspart  0-9 Units Subcutaneous TID WC   lidocaine  1 patch Transdermal Q24H   metoprolol tartrate  25 mg Oral BID   polyethylene glycol  17 g Oral Daily   sertraline  100 mg Oral Daily    Dialysis Orders: MWF [facility] 4h  400/600  81.2kg    LUA AVF   Heparin 5000 - no esa or vdra - last OP HD 6/12, post wt 80.5kg  - last Hb 11.2, last pth 33   CXR 6/14 - IMPRESSION: 1. Right basilar patchy opacities, which may represent atelectasis, aspiration, or pneumonia. 2. Trace right pleural effusion. 3. Cardiomegaly.    Assessment/ Plan: SVT: On admit, with CP and hypotension. Pt converted in ED spontaneously and BP since improved. Per primary team.  ESRD: Usual MWF schedule - next HD tomorrow - either here or as outpatient. BP/ volume - 1st BP 70s  in the ED, now back up range w/ NSR. Closer to EDW today. 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 Not on ESA as outpatient, monitor here for  now. Secondary HPTH: Ca good, Phos pending. Continue home meds. DM2 - on insulin Recent hip Fx s/p ORIF on 09/10/22 Debility - getting rehab in SNF  [place], [place] 11/27/2022, 9:29 AM  [facility]

## 2022-11-27 NOTE — Progress Notes (Signed)
Alerted by nursing that patient was reporting nausea. Repeat EKG ordered to rule-out recurrent tachyarrhythmia.  Repeat EKG with Sinus Rhythm at a rate of 74bpm. PR and QRS intervals are borderline (206 and 122 ms respectively) prolonged with a prolonged QTc (548). K on his am labs is reassuringly WNL at 3.6. Tracing redemonstrates downsloping ST segments in the lateral leads, consistent with earlier tracings.  I was near the unit so stopped in to see the patient. Sleeping comfortably, awakens easily to voice. He reports nausea similar to this at home which he usually treats with Zofran. However, he feels that he could just sleep it off tonight when I raised concerns about giving anti-emetics given his prolonged QT. He denies any chest pain, tightness, or shortness of breath. No chills.  Gen: Comfortable, NAD Cardio: RRR, transmitted fistula sounds Pulm: Normal WOB on RA, lungs clear in all fields  Suspect present symptoms are due to his known esophageal cancer.  - Will hold off on anti-emetic drugs at this time.   Eliezer Mccoy, MD

## 2022-11-27 NOTE — Assessment & Plan Note (Signed)
Noted on CTA chest/abd/pelvis.  Likely chronic issue but remains on his home O2.  Would not intervene at this time.

## 2022-11-27 NOTE — Assessment & Plan Note (Signed)
Troponins peaked to 608 and flattened at 595.  Cardiology advised that this is likely related to demand ischemia.  See further workup above.

## 2022-11-27 NOTE — TOC Progression Note (Signed)
Transition of Care [facility]) - Progression Note    Patient Details  Name: [name] MRN: [id number] Date of Birth: June 16, 1955  Transition of Care [facility]) CM/SW Contact  Helene Kelp, [place] Phone Number: 11/27/2022, 4:14 PM  Clinical Narrative:    CSW contacted to support patient discharge disposition (return to SNF facility [facility]).   This Clinical research associate made contact with with [facility] representative/nursing, who noted the patient can reurn to them on 11/28/22.   This Clinical research associate updated the clinical team to the information noted above regarding 11/28/22 discharge and transport to the SNF [facility] for nursing and rehab) [phone].   FOLLOW-UP NEEDS Arrange transport with [facility]. Prepare d/c packet and follow through with [facility] discharge process.    Expected Discharge Plan and Services  To SNF provider/agency [facility] nursing and rehab.    Social Determinants of Health ([name]) Interventions [name] Screenings   Food Insecurity: No Food Insecurity (11/25/2022)  Housing: Low Risk  (11/25/2022)  Transportation Needs: No Transportation Needs (11/25/2022)  Recent Concern: Transportation Needs - Unmet Transportation Needs (09/09/2022)  Utilities: Not At Risk (11/25/2022)  Recent Concern: Utilities - At Risk (09/09/2022)  Alcohol Screen: Low Risk  (11/16/2022)  Depression (PHQ2-9): Low Risk  (11/16/2022)  Recent Concern: Depression (PHQ2-9) - Medium Risk (09/01/2022)  Financial Resource Strain: Low Risk  (11/16/2022)  Physical Activity: Sufficiently Active (11/16/2022)  Social Connections: Unknown (11/16/2022)  Stress: No Stress Concern Present (11/16/2022)  Tobacco Use: Medium Risk (11/16/2022)    Readmission Risk Interventions    08/19/2022    8:11 AM 05/04/2022    9:06 AM 03/01/2022    8:14 AM  Readmission Risk Prevention Plan  Transportation Screening Complete Complete Complete  Medication Review (RN Care Manager) Complete Complete Complete  HRI or Home Care Consult  Complete Complete Complete  SW Recovery Care/Counseling Consult Complete Complete Complete  Palliative Care Screening Not Applicable Not Applicable Not Applicable  Skilled Nursing Facility Not Applicable Not Applicable Not Applicable

## 2022-11-27 NOTE — Assessment & Plan Note (Addendum)
CBGs remain appropriate ranging between 120s to 180s. Patient received 3 units of SAI yesterday.  -Continue sensitive SSI

## 2022-11-27 NOTE — Progress Notes (Signed)
Daily Progress Note Intern Pager: [phone]  Patient name: [name] Medical record number: [id number] Date of birth: Jul 17, 1955 Age: 67 y.o. Gender: male  Primary Care Provider: Babs Sciara, MD Consultants: Cardiology, Nephrology Code Status: DNR  Pt Overview and Major Events to Date:  6/14 - admitted  Assessment and Plan:  [name] is a 67 y.o. male who presented with acute chest pain, palpitations, tachycardia, hypotension that woke him from his sleep who upon arrival to the ED had spontaneously converted and stabilized.  His troponins are elevated suspect to be in the setting of demand ischemia is currently receiving workup with assistance by cardiology. Pertinent PMH/PSH includes CAD status post CABG, HTN, T2DM, systolic and diastolic CHF, esophageal adenocarcinoma, chronic respiratory failure on 3 L [place], A-fib, PE.   Hospital Problem List      Hospital     * (Principal) SVT (supraventricular tachycardia)     Now in NSR, asymptomatic, tachycardia and hypotension have resolved. Is  on BB and amiodarone for rate/rhythm control.  -Cardiology following, appreciate recommendations -Continue metoprolol tartrate 25 mg twice daily,  - Continue amiodarone 200 mg        Elevated troponin     Troponins peaked to 608 and flattened at 595.  Cardiology advised that  this is likely related to demand ischemia.  See further workup above.        Type 2 diabetes mellitus (HCC)     CBGs remain appropriate ranging between 120s to 180s. Patient received  3 units of SAI yesterday.  -Continue sensitive SSI        ESRD (end stage renal disease) on dialysis [facility])     Receives outpatient HD MWF. Still makes urine. Last dialysis session  yesterday 6/15. Cr improved 3.40 (7.76), BUN 22 (36). -Nephrology following, appreciate recommendations -AM BMP        Pleural effusion     Noted on CTA chest/abd/pelvis.  Likely chronic issue but remains on his  home O2.  Would not  intervene at this time.        FEN/GI: Dysphagia 2 diet PPx: Sub q Hep Dispo: pending clinical improvement . Barriers include workup.   Subjective:  Doing well this morning, some hip pain. Feeling like going home.  Objective: Temp:  [97.4 F (36.3 C)-98.3 F (36.8 C)] 97.8 F (36.6 C) (06/16 0846) Pulse Rate:  [70-83] 83 (06/16 0846) Resp:  [15-23] 23 (06/16 0846) BP: (130-173)/(68-88) 173/83 (06/16 0846) SpO2:  [90 %-100 %] 95 % (06/16 0846) Weight:  [82.1 kg-82.6 kg] 82.6 kg (06/16 0401) Physical Exam: General: NAD, polite, comfortable, male Cardiovascular: RRR, [facility] Respiratory: CTABL, normal WOB Abdomen: Soft, NTTP, non-distended Extremities: Moving all extremities, no edema  Laboratory: Most recent CBC Lab Results  Component Value Date   WBC 6.1 11/26/2022   HGB 10.5 (L) 11/26/2022   HCT 34.7 (L) 11/26/2022   MCV 84.2 11/26/2022   PLT 274 11/26/2022   Most recent BMP    Latest Ref Rng & Units 11/27/2022   12:58 AM  BMP  Glucose 70 - 99 mg/dL 638   BUN 8 - 23 mg/dL 22   Creatinine 7.56 - 1.24 mg/dL 4.33   Sodium 295 - 188 mmol/L 135   Potassium 3.5 - 5.1 mmol/L 3.6   Chloride 98 - 111 mmol/L 96   CO2 22 - 32 mmol/L 26   Calcium 8.9 - 10.3 mg/dL 8.5     Bess Kinds, MD 11/27/2022, 10:51 AM  PGY-2, [facility] FPTS Intern pager: [phone], text pages welcome Secure chat group [facility] Teaching Service

## 2022-11-27 NOTE — Consult Note (Signed)
Cardiology Consultation   Patient ID: [name] MRN: [id number]; DOB: 1955-06-22  Admit date: 11/25/2022 Date of Consult: 11/27/2022  PCP:  Babs Sciara, MD   [facility] Providers Cardiologist:  Chilton Si, MD        Patient Profile:   [name] is a 67 y.o. male with a hx of SVT who is being seen 11/27/2022 for the evaluation of SVT at the request of Dr. Elease Hashimoto.  History of Present Illness:   [name] is a pleasant 67 yo man with esophageal CA, recent hip fracture, and SVT who was admitted with sustained SVT. He also carries a diagnosis of atrial fib, ?SVT mediated. The patient has been treated with amiodarone and appears to be maintaining NSR. He denies any syncope but feels badly when he is in SVT. He started low dose amio about 3 months ago. He also had known CAD s/p CABG but denies anginal symptoms.    Past Medical History:  Diagnosis Date   Anemia    Arthritis    hips shoulders   CAD (coronary artery disease) 03/03/2019   Cancer (HCC)    Esophageal cancer 2022   CHF (congestive heart failure) (HCC) 2020   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   Admitted 11/14-11/20/19-diuresed 10L   Depression 05/31/2021   Fatigue 10/14/2020   Hypertension    Myocardial infarction [facility])    Sleep apnea    uses a bipap machine   Stroke (HCC) 12/14/2020   no last weakness or paralysis   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    Past Surgical History:  Procedure Laterality Date   AV FISTULA PLACEMENT Left 12/21/2021   Procedure: LEFT ARM ARTERIOVENOUS (AV) FISTULA CREATION;  Surgeon: Larina Earthly, MD;  Location: [facility] ORS;  Service: Vascular;  Laterality: Left;   BIOPSY  06/27/2019   Procedure: BIOPSY;  Surgeon: Malissa Hippo, MD;  Location: [facility];  Service: Endoscopy;;  ascending colon polyp   BIOPSY  10/21/2020   Procedure: BIOPSY;   Surgeon: Malissa Hippo, MD;  [facility]: [facility];  Service: Endoscopy;;  esophageal,gastric polyp   BIOPSY  02/24/2021   Procedure: BIOPSY;  Surgeon: Malissa Hippo, MD;  [facility]: [facility];  Service: Endoscopy;;  distal and proximal esophageal biopsies    BIOPSY  05/30/2022   Procedure: BIOPSY;  Surgeon: Lanelle Bal, DO;  [facility]: [facility];  Service: Endoscopy;;   CARDIAC SURGERY     CATARACT EXTRACTION     COLONOSCOPY N/A 06/27/2019   [name]:Diverticulosis in the entire examined colon. tubular adenoma in ascending colon, 4mm tubular adenoma in prox sigmoid. external hemorrhoids   CORONARY ARTERY BYPASS GRAFT N/A 03/25/2019   Procedure: CORONARY ARTERY BYPASS GRAFTING (CABG) X  4 USING LEFT INTERNAL MAMMARY ARTERY AND RIGHT SAPHENOUS VEIN GRAFTS;  Surgeon: Corliss Skains, MD;  Location: [facility] OR;  Service: Open Heart Surgery;  Laterality: N/A;   ESOPHAGEAL BRUSHING  03/08/2022   Procedure: ESOPHAGEAL BRUSHING;  Surgeon: Lanelle Bal, DO;  [facility]: [facility];  Service: Endoscopy;;   ESOPHAGOGASTRODUODENOSCOPY (EGD) WITH PROPOFOL N/A 10/21/2020   rehman:Normal hypopharynx.Normal proximal esophagus and mid esophagus.Esophageal mucosal changes consistent with long-segment Barrett's esophagus. (Focal low-grade dysplasia and atypia proximally) z line irregular 37 cm from incisors, 3 cm HH, single gastric polyp (fundic gland) normal duodenal bulb/second portion of duodenum, proximal margin of Barrett's  at 34 cm   ESOPHAGOGASTRODUODENOSCOPY (EGD) WITH PROPOFOL N/A 02/24/2021   Procedure: ESOPHAGOGASTRODUODENOSCOPY (EGD) WITH PROPOFOL;  Surgeon: Malissa Hippo, MD;  [facility];  Service: Endoscopy;  Laterality: N/A;  1:35   ESOPHAGOGASTRODUODENOSCOPY (EGD) WITH PROPOFOL N/A 03/21/2021   Procedure: ESOPHAGOGASTRODUODENOSCOPY (EGD) WITH PROPOFOL;  Surgeon: Jeani Hawking, MD;  Location: [facility] ENDOSCOPY;  Service: Endoscopy;  Laterality: N/A;    ESOPHAGOGASTRODUODENOSCOPY (EGD) WITH PROPOFOL N/A 10/23/2021   Procedure: ESOPHAGOGASTRODUODENOSCOPY (EGD) WITH PROPOFOL;  Surgeon: Sherrilyn Rist, MD;  Location: [facility] ENDOSCOPY;  Service: Gastroenterology;  Laterality: N/A;   ESOPHAGOGASTRODUODENOSCOPY (EGD) WITH PROPOFOL N/A 03/08/2022   Procedure: ESOPHAGOGASTRODUODENOSCOPY (EGD) WITH PROPOFOL;  Surgeon: Lanelle Bal, DO;  [facility];  Service: Endoscopy;  Laterality: N/A;   ESOPHAGOGASTRODUODENOSCOPY (EGD) WITH PROPOFOL N/A 05/27/2022   Procedure: ESOPHAGOGASTRODUODENOSCOPY (EGD) WITH PROPOFOL;  Surgeon: Dolores Frame, MD;  [facility];  Service: Gastroenterology;  Laterality: N/A;   ESOPHAGOGASTRODUODENOSCOPY (EGD) WITH PROPOFOL N/A 05/30/2022   Procedure: ESOPHAGOGASTRODUODENOSCOPY (EGD) WITH PROPOFOL;  Surgeon: Lanelle Bal, DO;  [facility];  Service: Endoscopy;  Laterality: N/A;   EXCISION MORTON'S NEUROMA     EYE SURGERY Left    retina   INCISION AND DRAINAGE OF WOUND Right 06/28/2021   Procedure: IRRIGATION AND DEBRIDEMENT WOUND;  Surgeon: Allena Napoleon, MD;  Location: [facility];  Service: Plastics;  Laterality: Right;  1.5 hour   INSERTION OF DIALYSIS CATHETER Right 12/21/2021   Procedure: INSERTION OF TUNNELED DIALYSIS CATHETER;  Surgeon: Larina Earthly, MD;  Location: [facility];  Service: Vascular;  Laterality: Right;   INTRAMEDULLARY (IM) NAIL INTERTROCHANTERIC Right 09/10/2022   Procedure: INTRAMEDULLARY (IM) NAIL INTERTROCHANTERIC;  Surgeon: Joen Laura, MD;  Location: [facility];  Service: Orthopedics;  Laterality: Right;   LEFT HEART CATH AND CORS/GRAFTS ANGIOGRAPHY N/A 02/01/2022   Procedure: LEFT HEART CATH AND CORS/GRAFTS ANGIOGRAPHY;  Surgeon: Swaziland, Peter M, MD;  Location: [facility] INVASIVE CV LAB;  Service: Cardiovascular;  Laterality: N/A;   POLYPECTOMY  06/27/2019   Procedure: POLYPECTOMY;  Surgeon: Malissa Hippo, MD;  [facility];  Service: Endoscopy;;   proximal sigmoid colon   RIGHT/LEFT HEART CATH AND CORONARY ANGIOGRAPHY N/A 03/12/2019   Procedure: RIGHT/LEFT HEART CATH AND CORONARY ANGIOGRAPHY;  Surgeon: Corky Crafts, MD;  Location: [facility] INVASIVE CV LAB;  Service: Cardiovascular;  Laterality: N/A;   SHOULDER ARTHROSCOPY WITH ROTATOR CUFF REPAIR AND SUBACROMIAL DECOMPRESSION Right 08/26/2020   Procedure: RIGHT SHOULDER MINI OPEN ROTATOR CUFF REPAIR AND SUBACROMIAL DECOMPRESSION WITH PATCH GRAFT;  Surgeon: Jene Every, MD;  Location: [facility] ORS;  Service: Orthopedics;  Laterality: Right;  90 MINS GENERAL WITH BLOCK   SKIN SPLIT GRAFT Right 06/28/2021   Procedure: SKIN GRAFT SPLIT THICKNESS;  Surgeon: Allena Napoleon, MD;  Location: [facility];  Service: Plastics;  Laterality: Right;   TEE WITHOUT CARDIOVERSION N/A 03/25/2019   Procedure: TRANSESOPHAGEAL ECHOCARDIOGRAM (TEE);  Surgeon: Corliss Skains, MD;  Location: [facility] OR;  Service: Open Heart Surgery;  Laterality: N/A;     Home Medications:  Prior to Admission medications   Medication Sig Start Date End Date Taking? Authorizing Provider  acetaminophen (TYLENOL) 325 MG tablet Take 2 tablets (650 mg total) by mouth every 6 (six) hours as needed for mild pain, moderate pain or fever. 08/02/21  Yes Drema Dallas, MD  albuterol (VENTOLIN HFA) 108 (90 Base) MCG/ACT inhaler INHALE TWO PUFFS BY MOUTH EVERY 6 HOURS AS NEEDED Patient taking  differently: Inhale 2 puffs into the lungs every 6 (six) hours as needed for wheezing or shortness of breath. 08/11/22  Yes [name], Jonna Coup, MD  amiodarone (PACERONE) 200 MG tablet Take 1 tablet (200 mg total) by mouth 2 (two) times daily. Through 09/08/22.  Then 1 tab (200 mg) once daily starting 09/09/22 Patient taking differently: Take 200 mg by mouth daily. 09/17/22  Yes Jerald Kief, MD  calcium acetate (PHOSLO) 667 MG capsule Take 667 mg by mouth See admin instructions. Pt takes 1 capsule 3 times daily Tuesday,Thursday,Saturday,Sunday. All other days are  twice daily   Yes [provider]  docusate sodium (COLACE) 100 MG capsule Take 100 mg by mouth 2 (two) times daily.   Yes [provider]  doxazosin (CARDURA) 2 MG tablet TAKE 1 TABLET BY MOUTH DAILY 07/29/22  Yes [name], Scott A, MD  enoxaparin (LOVENOX) 30 MG/0.3ML injection Inject 0.3 mLs (30 mg total) into the skin daily for 21 days. 09/17/22 11/26/22 Yes Jerald Kief, MD  Evolocumab (REPATHA SURECLICK) 140 MG/ML SOAJ Inject 140 mg into the skin every 14 (fourteen) days. 06/29/22  Yes Alver Sorrow, NP  fentaNYL (DURAGESIC) 25 MCG/HR Place 1 patch onto the skin every 3 (three) days.   Yes [provider]  gabapentin (NEURONTIN) 100 MG capsule Take 100 mg by mouth at bedtime.   Yes [provider]  HYDROmorphone (DILAUDID) 2 MG tablet Take 2 mg by mouth every 6 (six) hours as needed for severe pain.   Yes [provider]  insulin lispro (HUMALOG KWIKPEN) 100 UNIT/ML KwikPen Inject 0-15 Units into the skin 3 (three) times daily. 0-120 - 0 units, 121-150-2 units. 151-200-3 units.  201-250=5 units. 251-300=8 units 301-350=11 units 351-400=15 units. If CBG greater than 400 call MD   Yes [provider]  LANTUS 100 UNIT/ML injection Inject 8 Units into the skin at bedtime. 04/07/22  Yes [provider]  methocarbamol (ROBAXIN) 500 MG tablet Take 1 tablet (500 mg total) by mouth every 8 (eight) hours as needed for muscle spasms. Patient taking differently: Take 500 mg by mouth daily. 09/17/22  Yes Jerald Kief, MD  metoprolol tartrate (LOPRESSOR) 25 MG tablet Take 1 tablet (25 mg total) by mouth 2 (two) times daily. In addition you can take a 3rd dose of metoprolol as needed for heart rate > 120 Patient taking differently: Take 12.5 mg by mouth 2 (two) times daily. In addition you can take a 3rd dose of metoprolol as needed for heart rate > 120 09/22/22  Yes Plunkett, Alphonzo Lemmings, MD  Nutritional Supplements (NUTRITIONAL SUPPLEMENT PO) Take 1  Can by mouth in the morning and at bedtime.   Yes [provider]  Ondansetron HCl 4 MG/2ML SOSY Inject 4 mg as directed every 8 (eight) hours as needed (nausea).   Yes [provider]  pantoprazole (PROTONIX) 40 MG tablet Take 1 tablet (40 mg total) by mouth 2 (two) times daily. 05/30/22  Yes Tat, Onalee Hua, MD  polyethylene glycol (MIRALAX) 17 g packet Take 17 g by mouth 2 (two) times daily. 09/17/22  Yes Jerald Kief, MD  senna (SENOKOT) 8.6 MG TABS tablet Take 2 tablets by mouth at bedtime as needed for mild constipation.   Yes [provider]  sertraline (ZOLOFT) 100 MG tablet TAKE 1 TABLET BY MOUTH DAILY 07/29/22  Yes [name], Jonna Coup, MD  bisacodyl (DULCOLAX) 10 MG suppository Place 1 suppository (10 mg total) rectally daily as needed for moderate constipation.  09/17/22   Jerald Kief, MD  EPINEPHrine 0.3 mg/0.3 mL IJ SOAJ injection Inject 0.3 mg into the muscle as needed for anaphylaxis. 01/25/21   Babs Sciara, MD  hydrALAZINE (APRESOLINE) 50 MG tablet Take 1 tablet (50 mg total) by mouth 3 (three) times daily. Patient not taking: Reported on 11/26/2022 06/23/22   Shon Hale, MD  nitroGLYCERIN (NITROSTAT) 0.4 MG SL tablet Place 1 tablet (0.4 mg total) under the tongue every 5 (five) minutes as needed for chest pain. 09/14/21   Alver Sorrow, NP  ondansetron (ZOFRAN-ODT) 4 MG disintegrating tablet Take 1 tablet by mouth every 8 hours as needed for nausea and vomiting. 08/02/22   Alver Sorrow, NP  oxyCODONE (OXY IR/ROXICODONE) 5 MG immediate release tablet Take 1-2 tablets (5-10 mg total) by mouth every 4 (four) hours as needed for moderate pain or severe pain. Patient not taking: Reported on 11/26/2022 09/17/22   Jerald Kief, MD    Inpatient Medications: Scheduled Meds:  amiodarone  200 mg Oral Daily   calcium acetate  667 mg Oral TID WC   Chlorhexidine Gluconate Cloth  6 each Topical Q0600   heparin  5,000 Units Subcutaneous Q8H   insulin aspart  0-9  Units Subcutaneous TID WC   metoprolol tartrate  25 mg Oral BID   polyethylene glycol  17 g Oral Daily   sertraline  100 mg Oral Daily   Continuous Infusions:  PRN Meds: acetaminophen, albuterol  Allergies:    Allergies  Allergen Reactions   Bee Venom Anaphylaxis   Atorvastatin Other (See Comments)    myalgia   Diltiazem Itching   Lopressor [Metoprolol]     NIGHTMARES. Pt tolerating this now 11/25/22   Reglan [Metoclopramide] Other (See Comments)    Suicidal    Rosuvastatin Other (See Comments)    myalgias   Valsartan Itching    Social History:   Social History   Socioeconomic History   Marital status: Single    Spouse name: Not on file   Number of children: 0   Years of education: 12   Highest education level: 12th grade  Occupational History   Not on file  Tobacco Use   Smoking status: Former    Types: Cigarettes    Quit date: 07/06/1988    Years since quitting: 34.4    Passive exposure: Past   Smokeless tobacco: Never  Vaping Use   Vaping Use: Never used  Substance and Sexual Activity   Alcohol use: Not Currently    Alcohol/week: 2.0 standard drinks of alcohol    Types: 2 Glasses of wine per week    Comment: socially   Drug use: No   Sexual activity: Not Currently    Partners: Female  Other Topics Concern   Not on file  Social History Narrative   Not on file   Social Determinants of Health   Financial Resource Strain: Low Risk  (11/16/2022)   Overall Financial Resource Strain (CARDIA)    Difficulty of Paying Living Expenses: Not hard at all  Food Insecurity: No Food Insecurity (11/25/2022)   Hunger Vital Sign    Worried About Running Out of Food in the Last Year: Never true    Ran Out of Food in the Last Year: Never true  Transportation Needs: No Transportation Needs (11/25/2022)   PRAPARE - Administrator, Civil Service (Medical): No    Lack of Transportation (Non-Medical): No  Recent Concern: Transportation Needs - Unmet Transportation  Needs (  09/09/2022)   PRAPARE - Administrator, Civil Service (Medical): Yes    Lack of Transportation (Non-Medical): No  Physical Activity: Sufficiently Active (11/16/2022)   Exercise Vital Sign    Days of Exercise per Week: 5 days    Minutes of Exercise per Session: 50 min  Stress: No Stress Concern Present (11/16/2022)   [employer] of Occupational Health - Occupational Stress Questionnaire    Feeling of Stress : Only a little  Social Connections: Unknown (11/16/2022)   Social Connection and Isolation Panel [NHANES]    Frequency of Communication with Friends and Family: More than three times a week    Frequency of Social Gatherings with Friends and Family: More than three times a week    Attends Religious Services: More than 4 times per year    Active Member of [employer] or Organizations: Yes    Attends Banker Meetings: More than 4 times per year    Marital Status: Not on file  Intimate Partner Violence: Not At Risk (11/25/2022)   Humiliation, Afraid, Rape, and Kick questionnaire    Fear of Current or Ex-Partner: No    Emotionally Abused: No    Physically Abused: No    Sexually Abused: No    Family History:    Family History  Problem Relation Age of Onset   Colon cancer Mother    Heart Problems Father    Diabetes Father    Valvular heart disease Father    Sleep apnea Neg Hx    Stroke Neg Hx      ROS:  Please see the history of present illness.   All other ROS reviewed and negative.     Physical Exam/Data:   Vitals:   11/26/22 1506 11/26/22 2012 11/27/22 0226 11/27/22 0401  BP: 130/73 (!) 162/88 (!) 163/79 (!) 172/83  Pulse:  72 70 72  Resp:  19 19 20   Temp: 98.3 F (36.8 C) (!) 97.4 F (36.3 C) 98.1 F (36.7 C) 97.9 F (36.6 C)  TempSrc:  Oral Oral Oral  SpO2:  96% 90% 96%  Weight:    82.6 kg    Intake/Output Summary (Last 24 hours) at 11/27/2022 0820 Last data filed at 11/26/2022 1227 Gross per 24 hour  Intake 240 ml  Output 2000 ml   Net -1760 ml      11/27/2022    4:01 AM 11/26/2022   12:27 PM 11/26/2022    8:28 AM  Last 3 Weights  Weight (lbs) 182 lb 1.6 oz 181 lb 186 lb 1.1 oz  Weight (kg) 82.6 kg 82.1 kg 84.4 kg     Body mass index is 26.13 kg/m.  General:  Diskempt, Well nourished, well developed, in no acute distress HEENT: normal Neck: no JVD Vascular: No carotid bruits; Distal pulses 2+ bilaterally Cardiac:  normal S1, S2; RRR; no murmur  Lungs:  clear to auscultation bilaterally, no wheezing, rhonchi or rales  Abd: soft, nontender, no hepatomegaly  Ext: no edema Musculoskeletal:  No deformities, BUE and BLE strength normal and equal Skin: warm and dry  Neuro:  CNs 2-12 intact, no focal abnormalities noted Psych:  Normal affect   EKG:  The EKG was personally reviewed and demonstrates:  nsr Telemetry:  Telemetry was personally reviewed and demonstrates:  nsr  Relevant CV Studies: See above  Laboratory Data:  High Sensitivity Troponin:   Recent Labs  Lab 11/25/22 1104 11/25/22 1414 11/25/22 1551 11/25/22 1741 11/25/22 1916  TROPONINIHS 155* 463* 553* 608*  595*     Chemistry Recent Labs  Lab 11/25/22 0817 11/25/22 0846 11/25/22 0853 11/25/22 1414 11/26/22 0040 11/27/22 0058  NA 137 136 138  --  133* 135  K 4.0 4.2 3.5  --  3.8 3.6  CL 98 101  --   --  95* 96*  CO2 24  --   --   --  25 26  GLUCOSE 128* 133*  --   --  203* 183*  BUN 35* 37*  --   --  36* 22  CREATININE 4.39* 4.60*  --   --  4.76* 3.40*  CALCIUM 8.6*  --   --   --  8.4* 8.5*  MG  --   --   --  2.0  --   --   GFRNONAA 14*  --   --   --  13* 19*  ANIONGAP 15  --   --   --  13 13    Recent Labs  Lab 11/25/22 0817  PROT 6.5  ALBUMIN 3.1*  AST 12*  ALT 8  ALKPHOS 96  BILITOT 0.8   Lipids No results for input(s): "CHOL", "TRIG", "HDL", "LABVLDL", "LDLCALC", "CHOLHDL" in the last 168 hours.  Hematology Recent Labs  Lab 11/25/22 0817 11/25/22 0846 11/25/22 0853 11/26/22 0040  WBC 8.5  --   --  6.1  RBC  4.18*  --   --  4.12*  HGB 10.7* 11.9* 10.5* 10.5*  HCT 35.9* 35.0* 31.0* 34.7*  MCV 85.9  --   --  84.2  MCH 25.6*  --   --  25.5*  MCHC 29.8*  --   --  30.3  RDW 16.9*  --   --  16.9*  PLT 290  --   --  274   Thyroid No results for input(s): "TSH", "FREET4" in the last 168 hours.  BNPNo results for input(s): "BNP", "PROBNP" in the last 168 hours.  DDimer No results for input(s): "DDIMER" in the last 168 hours.   Radiology/Studies:  ECHOCARDIOGRAM LIMITED  Result Date: 11/26/2022    ECHOCARDIOGRAM LIMITED REPORT   Patient Name:   TAHEEM ROXBURY Date of Exam: 11/26/2022 Medical Rec #:  [id number]        Height:       70.0 in Accession #:    [id number]       Weight:       181.0 lb Date of Birth:  10-Dec-1955        BSA:          2.001 m Patient Age:    67 years         BP:           151/70 mmHg Patient Gender: M                HR:           72 bpm. Exam Location:  Inpatient Procedure: Limited Echo and Limited Color Doppler Indications:    Tricuspid Valve Disease 107.9  History:        Patient has prior history of Echocardiogram examinations, most                 recent 08/18/2022. Cardiomyopathy and CHF, CAD and Angina, Prior                 CABG, Arrythmias:Tachycardia, Signs/Symptoms:Chest Pain and                 Syncope; Risk Factors:Hypertension, Sleep  Apnea, Dyslipidemia                 and Diabetes. ESRD.  Sonographer:    Lucendia Herrlich Referring Phys: [id number] Perlie Gold  Sonographer Comments: Image acquisition challenging due to patient body habitus. IMPRESSIONS  1. Left ventricular ejection fraction, by estimation, is 50 to 55%. The left ventricle has low normal function.  2. Right ventricular systolic function is normal. The right ventricular size is not well visualized. There is normal pulmonary artery systolic pressure. The estimated right ventricular systolic pressure is 35.5 mmHg.  3. No evidence of mitral valve regurgitation.  4. Mild leaflet thickening/calcification. Tricuspid valve  regurgitation is mild.  5. Aortic valve regurgitation is not visualized.  6. The inferior vena cava is dilated in size with >50% respiratory variability, suggesting right atrial pressure of 8 mmHg. FINDINGS  Left Ventricle: Left ventricular ejection fraction, by estimation, is 50 to 55%. The left ventricle has low normal function. Abnormal (paradoxical) septal motion consistent with post-operative status. Right Ventricle: The right ventricular size is not well visualized. Right ventricular systolic function is normal. There is normal pulmonary artery systolic pressure. The tricuspid regurgitant velocity is 2.62 m/s, and with an assumed right atrial pressure of 8 mmHg, the estimated right ventricular systolic pressure is 35.5 mmHg. Tricuspid Valve: Mild leaflet thickening/calcification. The tricuspid valve is grossly normal. Tricuspid valve regurgitation is mild. Aortic Valve: Aortic valve regurgitation is not visualized. Venous: The inferior vena cava is dilated in size with greater than 50% respiratory variability, suggesting right atrial pressure of 8 mmHg. LEFT VENTRICLE PLAX 2D LVIDd:         5.30 cm LVIDs:         3.85 cm LV PW:         1.30 cm LV IVS:        1.00 cm  IVC IVC diam: 2.90 cm LEFT ATRIUM         Index LA diam:    5.00 cm 2.50 cm/m  TRICUSPID VALVE TR Peak grad:   27.5 mmHg TR Vmax:        262.00 cm/s Carolan Clines Electronically signed by Carolan Clines Signature Date/Time: 11/26/2022/5:09:42 PM    Final    CT Angio Chest/Abd/Pel for Dissection W and/or Wo Contrast  Result Date: 11/25/2022 CLINICAL DATA:  Concern for thoracic aortic dissection.  Chest pain. EXAM: CT ANGIOGRAPHY CHEST, ABDOMEN AND PELVIS TECHNIQUE: Non-contrast CT of the chest was initially obtained. Multidetector CT imaging through the chest, abdomen and pelvis was performed using the standard protocol during bolus administration of intravenous contrast. Multiplanar reconstructed images and MIPs were obtained and reviewed to  evaluate the vascular anatomy. RADIATION DOSE REDUCTION: This exam was performed according to the departmental dose-optimization program which includes automated exposure control, adjustment of the mA and/or kV according to patient size and/or use of iterative reconstruction technique. CONTRAST:  OMNIPAQUE IOHEXOL 350 MG/ML SOLN COMPARISON:  None Available. FINDINGS: CTA CHEST FINDINGS Cardiovascular: Noncontrast series demonstrates no intramural hematoma within the thoracic aorta. Contrast enhanced imaging demonstrates no dissection or aneurysm of the thoracic aorta. Post CABG anatomy. No pericardial fluid. No pulmonary embolism. Mediastinum/Nodes: No axillary or supraclavicular adenopathy. No mediastinal or hilar adenopathy. No pericardial fluid. Esophagus normal. Lungs/Pleura: Moderate layering RIGHT pleural effusion. Mild RIGHT basilar atelectasis. No pulmonary infarction identified. No pneumonia. Musculoskeletal: Fracture of the posterior LEFT sixth rib (image 52/8). Healed fractures of lower posterior RIGHT ribs on image 137. Review of the MIP images confirms the above  findings. CTA ABDOMEN AND PELVIS FINDINGS VASCULAR Aorta: Normal caliber aorta without aneurysm, dissection, vasculitis or significant stenosis. Celiac: Patent without evidence of aneurysm, dissection, vasculitis or significant stenosis. SMA: Patent without evidence of aneurysm, dissection, vasculitis or significant stenosis. Renals: Both renal arteries are patent without evidence of aneurysm, dissection, vasculitis, fibromuscular dysplasia or significant stenosis. IMA: Patent without evidence of aneurysm, dissection, vasculitis or significant stenosis. Inflow: Patent without evidence of aneurysm, dissection, vasculitis or significant stenosis. Veins: No obvious venous abnormality within the limitations of this arterial phase study. Review of the MIP images confirms the above findings. NON-VASCULAR Hepatobiliary: No focal hepatic lesion.  Normal gallbladder. No biliary duct dilatation. Common bile duct is normal. Pancreas: Pancreas is normal. No ductal dilatation. No pancreatic inflammation. Spleen: Normal spleen Adrenals/urinary tract: Adrenal glands and kidneys are normal. Bilateral simple fluid attenuation Bosniak 1 renal cysts. No follow-up recommended. The ureters and bladder normal. Stomach/Bowel: Stomach, small bowel, appendix, and cecum are normal. The colon and rectosigmoid colon are normal. Multiple diverticula of the descending colon and sigmoid colon without acute inflammation. Vascular/Lymphatic: Abdominal aorta is normal caliber. No periportal or retroperitoneal adenopathy. No pelvic adenopathy. Reproductive: Other: No free fluid. Musculoskeletal: No aggressive osseous lesion. Intramedullary nail fixation of the RIGHT femur with associated postprocedural inflammatory change. Review of the MIP images confirms the above findings. IMPRESSION: CHEST IMPRESSION: 1. No evidence of thoracic aortic dissection or aneurysm. 2. No pulmonary embolism. 3. Moderate RIGHT pleural effusion with RIGHT basilar atelectasis. 4. Fracture of the posterior LEFT sixth rib is age indeterminate. Fracture of the posterior RIGHT ribs are chronic. PELVIS IMPRESSION: 1. No evidence of abdominal aortic aneurysm or dissection. 2. No acute findings in the abdomen pelvis. 3. LEFT colon diverticulosis without evidence diverticulitis. 4. RIGHT femur internal fixation Electronically Signed   By: Genevive Bi M.D.   On: 11/25/2022 09:44   DG Chest Port 1 View  Result Date: 11/25/2022 CLINICAL DATA:  Questionable sepsis EXAM: PORTABLE CHEST 1 VIEW COMPARISON:  Chest radiograph dated 09/22/2022 FINDINGS: Low lung volumes with bronchovascular crowding. Right basilar patchy opacities. Trace right pleural effusion. No pneumothorax. Enlarged cardiomediastinal silhouette. Median sternotomy wires are nondisplaced. IMPRESSION: 1. Right basilar patchy opacities, which may  represent atelectasis, aspiration, or pneumonia. 2. Trace right pleural effusion. 3. Cardiomegaly. Electronically Signed   By: Agustin Cree M.D.   On: 11/25/2022 09:04     Assessment and Plan:   SVT - At this point I would treat with amio 200 mg bid until I see him back in the office in [place].  CAD - no anginal symptoms s/p CABG.  Esoph CA - limits our treatment. I do not think that he is a candidate for ablation.    [facility] will sign off.   Medication Recommendations:  amiodarone 200 mg twice daily Other recommendations (labs, testing, etc):  none Follow up as an outpatient:  with Dr. Ladona Ridgel in [place] in 2-3 months.  For questions or updates, please contact [facility] Please consult [email or web address] for contact info under    Signed, Lewayne Bunting, MD  11/27/2022 8:20 AM

## 2022-11-27 NOTE — Progress Notes (Addendum)
PHARMACY - PHYSICIAN COMMUNICATION CRITICAL VALUE ALERT - BLOOD CULTURE IDENTIFICATION (BCID)  [name] is an 67 y.o. male who presented to [facility] on 11/25/2022 with a chief complaint of chest pain  Assessment:  1/4 staph epi on BCID, here for SVT, WBC wnl, afebrile, likely contaminant with 1/4 coag negative staph and no s/s infection  Name of physician (or Provider) Contacted: [place]  Current antibiotics: None  Changes to prescribed antibiotics recommended:  None, F/U BCx  Results for orders placed or performed during the hospital encounter of 11/25/22  Blood Culture ID Panel (Reflexed) (Collected: 11/25/2022  8:22 AM)  Result Value Ref Range   Enterococcus faecalis NOT DETECTED NOT DETECTED   Enterococcus Faecium NOT DETECTED NOT DETECTED   Listeria monocytogenes NOT DETECTED NOT DETECTED   Staphylococcus species DETECTED (A) NOT DETECTED   Staphylococcus aureus (BCID) NOT DETECTED NOT DETECTED   Staphylococcus epidermidis DETECTED (A) NOT DETECTED   Staphylococcus lugdunensis NOT DETECTED NOT DETECTED   Streptococcus species NOT DETECTED NOT DETECTED   Streptococcus agalactiae NOT DETECTED NOT DETECTED   Streptococcus pneumoniae NOT DETECTED NOT DETECTED   Streptococcus pyogenes NOT DETECTED NOT DETECTED   A.calcoaceticus-baumannii NOT DETECTED NOT DETECTED   Bacteroides fragilis NOT DETECTED NOT DETECTED   Enterobacterales NOT DETECTED NOT DETECTED   Enterobacter cloacae complex NOT DETECTED NOT DETECTED   Escherichia coli NOT DETECTED NOT DETECTED   Klebsiella aerogenes NOT DETECTED NOT DETECTED   Klebsiella oxytoca NOT DETECTED NOT DETECTED   Klebsiella pneumoniae NOT DETECTED NOT DETECTED   Proteus species NOT DETECTED NOT DETECTED   Salmonella species NOT DETECTED NOT DETECTED   Serratia marcescens NOT DETECTED NOT DETECTED   Haemophilus influenzae NOT DETECTED NOT DETECTED   Neisseria meningitidis NOT DETECTED NOT DETECTED   Pseudomonas aeruginosa NOT  DETECTED NOT DETECTED   Stenotrophomonas maltophilia NOT DETECTED NOT DETECTED   Candida albicans NOT DETECTED NOT DETECTED   Candida auris NOT DETECTED NOT DETECTED   Candida glabrata NOT DETECTED NOT DETECTED   Candida krusei NOT DETECTED NOT DETECTED   Candida parapsilosis NOT DETECTED NOT DETECTED   Candida tropicalis NOT DETECTED NOT DETECTED   Cryptococcus neoformans/gattii NOT DETECTED NOT DETECTED   Methicillin resistance mecA/C DETECTED (A) NOT DETECTED    Daylene Posey 11/27/2022  2:00 AM

## 2022-11-27 NOTE — Discharge Instructions (Addendum)
Dear [name],  Thank you for letting us participate in your care. You were hospitalized for an arrythmia and diagnosed with SVT (supraventricular tachycardia). You were treated with amioadarone and carvedilol, two meds to help control your heat rhythm and heart rate.   POST-HOSPITAL & CARE INSTRUCTIONS Make a follow up appointment with [facility], Dr. Ladona Ridgel   Make a follow up appointment with your Primary Care Provider Go to your follow up appointments (listed below)   DOCTOR'S APPOINTMENT   Future Appointments  Date Time Provider Department Center  11/30/2022  9:00 AM [place], Fanny Dance, LCSW [facility] None  01/10/2023  3:10 PM Alver Sorrow, NP [facility]    Follow-up Information     Babs Sciara, MD Follow up in 1 week(s).   Specialty: Family Medicine Contact information: [address] [place] [address] [phone]                 Take care and be well!  Family Medicine Teaching Service Inpatient Team [place]  [facility]  [address] [place], [place] [address] [phone]

## 2022-11-27 NOTE — Assessment & Plan Note (Signed)
Now in NSR, asymptomatic, tachycardia and hypotension have resolved. Is on BB and amiodarone for rate/rhythm control.  -Cardiology following, appreciate recommendations -Continue metoprolol tartrate 25 mg twice daily,  - Continue amiodarone 200 mg

## 2022-11-28 ENCOUNTER — Encounter (HOSPITAL_COMMUNITY): Payer: Self-pay | Admitting: Student

## 2022-11-28 LAB — CBC
HCT: 33.9 % — ABNORMAL LOW (ref 39.0–52.0)
Hemoglobin: 10.5 g/dL — ABNORMAL LOW (ref 13.0–17.0)
MCH: 26.2 pg (ref 26.0–34.0)
MCHC: 31 g/dL (ref 30.0–36.0)
MCV: 84.5 fL (ref 80.0–100.0)
Platelets: 310 10*3/uL (ref 150–400)
RBC: 4.01 MIL/uL — ABNORMAL LOW (ref 4.22–5.81)
RDW: 17.1 % — ABNORMAL HIGH (ref 11.5–15.5)
WBC: 7 10*3/uL (ref 4.0–10.5)
nRBC: 0 % (ref 0.0–0.2)

## 2022-11-28 LAB — CULTURE, BLOOD (ROUTINE X 2)

## 2022-11-28 LAB — BASIC METABOLIC PANEL
Anion gap: 16 — ABNORMAL HIGH (ref 5–15)
BUN: 28 mg/dL — ABNORMAL HIGH (ref 8–23)
CO2: 22 mmol/L (ref 22–32)
Calcium: 8.6 mg/dL — ABNORMAL LOW (ref 8.9–10.3)
Chloride: 95 mmol/L — ABNORMAL LOW (ref 98–111)
Creatinine, Ser: 4.32 mg/dL — ABNORMAL HIGH (ref 0.61–1.24)
GFR, Estimated: 14 mL/min — ABNORMAL LOW (ref >60)
Glucose, Bld: 114 mg/dL — ABNORMAL HIGH (ref 70–99)
Potassium: 3.6 mmol/L (ref 3.5–5.1)
Sodium: 133 mmol/L — ABNORMAL LOW (ref 135–145)

## 2022-11-28 LAB — GLUCOSE, CAPILLARY
Glucose-Capillary: 129 mg/dL — ABNORMAL HIGH (ref 70–99)
Glucose-Capillary: 110 mg/dL — ABNORMAL HIGH (ref 70–99)
Glucose-Capillary: 198 mg/dL — ABNORMAL HIGH (ref 70–99)
Glucose-Capillary: 142 mg/dL — ABNORMAL HIGH (ref 70–99)

## 2022-11-28 MED ORDER — HEPARIN SODIUM (PORCINE) 1000 UNIT/ML DIALYSIS
2000.0000 [IU] | Freq: Once | INTRAMUSCULAR | Status: AC
  Administered 2022-11-28: 2000 [IU] via INTRAVENOUS_CENTRAL
  Filled 2022-11-28: qty 2

## 2022-11-28 MED ORDER — TRIMETHOBENZAMIDE HCL 100 MG/ML IM SOLN
200.0000 mg | Freq: Once | INTRAMUSCULAR | Status: AC
  Administered 2022-11-28: 200 mg via INTRAMUSCULAR
  Filled 2022-11-28: qty 2

## 2022-11-28 MED ORDER — AMIODARONE HCL 200 MG PO TABS
200.0000 mg | ORAL_TABLET | Freq: Two times a day (BID) | ORAL | Status: DC
  Administered 2022-11-28 – 2022-11-29 (×2): 200 mg via ORAL
  Filled 2022-11-28 (×2): qty 1

## 2022-11-28 MED ORDER — DOXAZOSIN MESYLATE 2 MG PO TABS
2.0000 mg | ORAL_TABLET | Freq: Every day | ORAL | Status: DC
  Administered 2022-11-28 – 2022-11-29 (×2): 2 mg via ORAL
  Filled 2022-11-28 (×2): qty 1

## 2022-11-28 NOTE — Assessment & Plan Note (Signed)
Appropriate hospital CBG ranges. -Continue sensitive SSI

## 2022-11-28 NOTE — Assessment & Plan Note (Signed)
Restart home Cardura 

## 2022-11-28 NOTE — Progress Notes (Signed)
[facility] Progress Note   Subjective:  Seen on HD- had some nausea earlier, now OK.  BP is high.    Objective Vitals:   11/28/22 0743 11/28/22 0800 11/28/22 0830 11/28/22 0900  BP: (!) 169/79 (!) 181/85 (!) 187/87 (!) 185/91  Pulse: 74 76 77 77  Resp: 17 19 18 19   Temp:      TempSrc:      SpO2: 94% 96% 97% 100%  Weight:       Physical Exam General: Chronically ill appearing man, room air, bright affect today.   Heart: RRR; no murmur Lungs: CTA anteriorly Abdomen: soft Extremities: no LE edema Dialysis Access: LUE AVF + bruit  Additional Objective Labs: Basic Metabolic Panel: Recent Labs  Lab 11/26/22 0040 11/27/22 0058 11/28/22 0109  NA 133* 135 133*  K 3.8 3.6 3.6  CL 95* 96* 95*  CO2 25 26 22   GLUCOSE 203* 183* 114*  BUN 36* 22 28*  CREATININE 4.76* 3.40* 4.32*  CALCIUM 8.4* 8.5* 8.6*   Liver Function Tests: Recent Labs  Lab 11/25/22 0817  AST 12*  ALT 8  ALKPHOS 96  BILITOT 0.8  PROT 6.5  ALBUMIN 3.1*   CBC: Recent Labs  Lab 11/25/22 0817 11/25/22 0846 11/25/22 0853 11/26/22 0040  WBC 8.5  --   --  6.1  NEUTROABS 6.2  --   --   --   HGB 10.7* 11.9* 10.5* 10.5*  HCT 35.9* 35.0* 31.0* 34.7*  MCV 85.9  --   --  84.2  PLT 290  --   --  274   Blood Culture    Component Value Date/Time   SDES BLOOD RIGHT ARM 11/25/2022 0822   SPECREQUEST  11/25/2022 0822    BOTTLES DRAWN AEROBIC AND ANAEROBIC Blood Culture adequate volume   CULT  11/25/2022 0822    GRAM POSITIVE COCCI IDENTIFICATION TO FOLLOW Performed at [facility], [address] [place], [place] [address]    REPTSTATUS PENDING 11/25/2022 1914   Studies/Results: ECHOCARDIOGRAM LIMITED  Result Date: 11/26/2022    ECHOCARDIOGRAM LIMITED REPORT   Patient Name:   ABSALON MANNIS Date of Exam: 11/26/2022 Medical Rec #:  [id number]        Height:       70.0 in Accession #:    [id number]       Weight:       181.0 lb Date of Birth:  April 15, 1956        BSA:           2.001 m Patient Age:    67 years         BP:           151/70 mmHg Patient Gender: M                HR:           72 bpm. Exam Location:  Inpatient Procedure: Limited Echo and Limited Color Doppler Indications:    Tricuspid Valve Disease 107.9  History:        Patient has prior history of Echocardiogram examinations, most                 recent 08/18/2022. Cardiomyopathy and CHF, CAD and Angina, Prior                 CABG, Arrythmias:Tachycardia, Signs/Symptoms:Chest Pain and                 Syncope; Risk Factors:Hypertension, Sleep  Apnea, Dyslipidemia                 and Diabetes. ESRD.  Sonographer:    Lucendia Herrlich Referring Phys: [id number] Perlie Gold  Sonographer Comments: Image acquisition challenging due to patient body habitus. IMPRESSIONS  1. Left ventricular ejection fraction, by estimation, is 50 to 55%. The left ventricle has low normal function.  2. Right ventricular systolic function is normal. The right ventricular size is not well visualized. There is normal pulmonary artery systolic pressure. The estimated right ventricular systolic pressure is 35.5 mmHg.  3. No evidence of mitral valve regurgitation.  4. Mild leaflet thickening/calcification. Tricuspid valve regurgitation is mild.  5. Aortic valve regurgitation is not visualized.  6. The inferior vena cava is dilated in size with >50% respiratory variability, suggesting right atrial pressure of 8 mmHg. FINDINGS  Left Ventricle: Left ventricular ejection fraction, by estimation, is 50 to 55%. The left ventricle has low normal function. Abnormal (paradoxical) septal motion consistent with post-operative status. Right Ventricle: The right ventricular size is not well visualized. Right ventricular systolic function is normal. There is normal pulmonary artery systolic pressure. The tricuspid regurgitant velocity is 2.62 m/s, and with an assumed right atrial pressure of 8 mmHg, the estimated right ventricular systolic pressure is 35.5 mmHg. Tricuspid  Valve: Mild leaflet thickening/calcification. The tricuspid valve is grossly normal. Tricuspid valve regurgitation is mild. Aortic Valve: Aortic valve regurgitation is not visualized. Venous: The inferior vena cava is dilated in size with greater than 50% respiratory variability, suggesting right atrial pressure of 8 mmHg. LEFT VENTRICLE PLAX 2D LVIDd:         5.30 cm LVIDs:         3.85 cm LV PW:         1.30 cm LV IVS:        1.00 cm  IVC IVC diam: 2.90 cm LEFT ATRIUM         Index LA diam:    5.00 cm 2.50 cm/m  TRICUSPID VALVE TR Peak grad:   27.5 mmHg TR Vmax:        262.00 cm/s Carolan Clines Electronically signed by Carolan Clines Signature Date/Time: 11/26/2022/5:09:42 PM    Final    Medications:   amiodarone  200 mg Oral Daily   calcium acetate  667 mg Oral TID WC   Chlorhexidine Gluconate Cloth  6 each Topical Q0600   heparin  5,000 Units Subcutaneous Q8H   insulin aspart  0-9 Units Subcutaneous TID WC   lidocaine  1 patch Transdermal Q24H   metoprolol tartrate  25 mg Oral BID   polyethylene glycol  17 g Oral Daily   sertraline  100 mg Oral Daily    Dialysis Orders: MWF South 4h  400/600  81.2kg    LUA AVF   Heparin 5000 - no esa or vdra - last OP HD 6/12, post wt 80.5kg  - last Hb 11.2, last pth 33   CXR 6/14 - IMPRESSION: 1. Right basilar patchy opacities, which may represent atelectasis, aspiration, or pneumonia. 2. Trace right pleural effusion. 3. Cardiomegaly.    Assessment/ Plan: SVT: On admit, with CP and hypotension. Pt converted in ED spontaneously and BP since improved. Per primary team.  ESRD: Usual MWF schedule - HD today BP/ volume - 1st BP 70s in the ED, now back up range w/ NSR. Closer to EDW today. 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 Not on ESA as outpatient, monitor here for now. Secondary HPTH: Ca good, Phos  pending. Continue home meds. DM2 - on insulin Recent hip Fx s/p ORIF on 09/10/22 Debility - getting rehab in SNF  Bufford Buttner MD 11/28/2022, 9:24 AM  [facility]

## 2022-11-28 NOTE — Assessment & Plan Note (Signed)
Receives outpatient HD MWF. Continue outpatient schedule. -Nephrology following, appreciate recommendations -AM BMP

## 2022-11-28 NOTE — Procedures (Signed)
Patient seen and examined on Hemodialysis. The procedure was supervised and I have made appropriate changes. BP (!) 186/86 (BP Location: Right Arm)   Pulse (!) 120   Temp 98.1 F (36.7 C) (Oral)   Resp (!) 23   Wt 81 kg Comment: bed  SpO2 100%   BMI 25.62 kg/m   QB 400 mL/ min via AVF, UF goal 2L  Tolerating treatment without complaints at this time.   Bufford Buttner MD [facility] [phone] 9:34 AM

## 2022-11-28 NOTE — Progress Notes (Signed)
Daily Progress Note Intern Pager: [phone]  Patient name: [name] Medical record number: [id number] Date of birth: July 26, 1955 Age: 67 y.o. Gender: male  Primary Care Provider: Babs Sciara, MD Consultants: Cardiology, Nephrology Code Status: DNR  Pt Overview and Major Events to Date:  6/14 - admitted   Assessment and Plan: [name] is a 67 y.o. male admitted for tachyarrhythmia and consequent type 2 MI due to demand ischemia. Pertinent PMH/PSH includes CAD status post CABG, HTN, T2DM, systolic and diastolic CHF, esophageal adenocarcinoma, chronic respiratory failure on 3 L [place], A-fib, PE.   Medically Stable for DC to [facility] Problem List      Hospital     * (Principal) SVT (supraventricular tachycardia)     Now in NSR, asymptomatic. Is on BB and amiodarone for rate/rhythm  control.  -Cardiology signed off, appreciate recommendations -Continue metoprolol tartrate 25 mg twice daily -Continue amiodarone 200 mg BID        HTN (hypertension)     Restart home Cardura        RESOLVED: Elevated troponin     Troponins peaked to 608 and flattened at 595.  Cardiology advised that  this is likely related to demand ischemia.  See further workup above.        Type 2 diabetes mellitus (HCC)     Appropriate hospital CBG ranges. -Continue sensitive SSI        ESRD (end stage renal disease) on dialysis [facility])     Receives outpatient HD MWF. Continue outpatient schedule. -Nephrology following, appreciate recommendations -AM BMP        Pleural effusion     Noted on CTA chest/abd/pelvis.  Likely chronic issue but remains on his  home O2.  Would not intervene at this time. -Outpatient work-up       FEN/GI: Dysphagia 2 diet PPx: SubQ Heparin Dispo: Medically Stable for DC to SNF  Subjective:  NAEO. States he threw up this morning x1 NBNB. Has nausea. This was prior to dialysis. Has some mild epigastric tenderness that has been present since before  admission.   Objective: Temp:  [98 F (36.7 C)-98.7 F (37.1 C)] 98 F (36.7 C) (06/17 1120) Pulse Rate:  [72-120] 76 (06/17 1125) Resp:  [12-27] 17 (06/17 1125) BP: (163-191)/(78-95) 175/85 (06/17 1125) SpO2:  [68 %-100 %] 100 % (06/17 1125) Weight:  [79.3 kg-81 kg] 79.3 kg (06/17 1124) Physical Exam: General: NAD, resting comfortably in HD Neuro: A&O Cardiovascular: RRR, no murmurs, no peripheral edema Respiratory: normal WOB on RA, CTAB, no wheezes, ronchi or rales Abdomen: soft, mild epigastric TTP, no rebound or guarding Extremities: Moving all 4 extremities equally   Laboratory: Most recent CBC Lab Results  Component Value Date   WBC 6.1 11/26/2022   HGB 10.5 (L) 11/26/2022   HCT 34.7 (L) 11/26/2022   MCV 84.2 11/26/2022   PLT 274 11/26/2022   Most recent BMP    Latest Ref Rng & Units 11/28/2022    1:09 AM  BMP  Glucose 70 - 99 mg/dL 147   BUN 8 - 23 mg/dL 28   Creatinine 8.29 - 1.24 mg/dL 5.62   Sodium 130 - 865 mmol/L 133   Potassium 3.5 - 5.1 mmol/L 3.6   Chloride 98 - 111 mmol/L 95   CO2 22 - 32 mmol/L 22   Calcium 8.9 - 10.3 mg/dL 8.6      Imaging/Diagnostic Tests: No new imaging.  Celine Mans, MD 11/28/2022, 12:16  PM  PGY-1, [facility] Family Medicine FPTS Intern pager: [phone], text pages welcome Secure chat group [facility] Teaching Service

## 2022-11-28 NOTE — Assessment & Plan Note (Deleted)
Troponins peaked to 608 and flattened at 595.  Cardiology advised that this is likely related to demand ischemia.  See further workup above. 

## 2022-11-28 NOTE — Assessment & Plan Note (Addendum)
Now in NSR, asymptomatic. Is on BB and amiodarone for rate/rhythm control.  -Cardiology signed off, appreciate recommendations -Continue metoprolol tartrate 25 mg twice daily -Continue amiodarone 200 mg BID

## 2022-11-28 NOTE — Progress Notes (Signed)
Received patient in bed to unit.  Alert and oriented.  Informed consent signed and in chart.   TX duration:3.5  Patient tolerated well.  Transported back to the room  Alert, without acute distress.  Hand-off given to patient's nurse.   Access used: left AVF Access issues: none  Total UF removed: 2L Medication(s) given: none   11/28/22 1120  Vitals  Temp 98 F (36.7 C)  Temp Source Oral  BP (!) 175/78  MAP (mmHg) 107  BP Location Right Arm  BP Method Automatic  Patient Position (if appropriate) Lying  Pulse Rate 75  Pulse Rate Source Monitor  ECG Heart Rate 76  Resp (!) 22  Oxygen Therapy  SpO2 99 %  O2 Device Room Air  During Treatment Monitoring  HD Safety Checks Performed Yes  Intra-Hemodialysis Comments Tx completed;Tolerated well  Dialysis Fluid Bolus Normal Saline  Bolus Amount (mL) 300 mL      Parley Pidcock [facility]

## 2022-11-28 NOTE — Plan of Care (Signed)

## 2022-11-28 NOTE — Assessment & Plan Note (Signed)
Noted on CTA chest/abd/pelvis.  Likely chronic issue but remains on his home O2.  Would not intervene at this time. -Outpatient work-up

## 2022-11-29 LAB — BASIC METABOLIC PANEL
Anion gap: 15 (ref 5–15)
BUN: 18 mg/dL (ref 8–23)
CO2: 27 mmol/L (ref 22–32)
Calcium: 8.7 mg/dL — ABNORMAL LOW (ref 8.9–10.3)
Chloride: 94 mmol/L — ABNORMAL LOW (ref 98–111)
Creatinine, Ser: 3.13 mg/dL — ABNORMAL HIGH (ref 0.61–1.24)
GFR, Estimated: 21 mL/min — ABNORMAL LOW (ref >60)
Glucose, Bld: 152 mg/dL — ABNORMAL HIGH (ref 70–99)
Potassium: 3.4 mmol/L — ABNORMAL LOW (ref 3.5–5.1)
Sodium: 136 mmol/L (ref 135–145)

## 2022-11-29 LAB — GLUCOSE, CAPILLARY
Glucose-Capillary: 163 mg/dL — ABNORMAL HIGH (ref 70–99)
Glucose-Capillary: 196 mg/dL — ABNORMAL HIGH (ref 70–99)
Glucose-Capillary: 171 mg/dL — ABNORMAL HIGH (ref 70–99)

## 2022-11-29 MED ORDER — AMIODARONE HCL 200 MG PO TABS: 200.0000 mg | ORAL_TABLET | Freq: Two times a day (BID) | ORAL | 0 refills | Status: DC

## 2022-11-29 MED ORDER — POLYETHYLENE GLYCOL 3350 17 G PO PACK: 17.0000 g | PACK | Freq: Every day | ORAL | 0 refills | Status: DC

## 2022-11-29 MED ORDER — ALBUTEROL SULFATE (2.5 MG/3ML) 0.083% IN NEBU: 3.0000 mL | INHALATION_SOLUTION | Freq: Four times a day (QID) | RESPIRATORY_TRACT | 12 refills | Status: DC | PRN

## 2022-11-29 MED ORDER — TRIMETHOBENZAMIDE HCL 100 MG/ML IM SOLN
200.0000 mg | Freq: Once | INTRAMUSCULAR | Status: AC
  Administered 2022-11-29: 200 mg via INTRAMUSCULAR
  Filled 2022-11-29: qty 2

## 2022-11-29 MED ORDER — SERTRALINE HCL 100 MG PO TABS: 100.0000 mg | ORAL_TABLET | Freq: Every day | ORAL | 0 refills | Status: DC

## 2022-11-29 MED ORDER — TRIMETHOBENZAMIDE HCL 300 MG PO CAPS: 300.0000 mg | ORAL_CAPSULE | Freq: Every day | ORAL | 0 refills | Status: DC | PRN

## 2022-11-29 NOTE — Plan of Care (Signed)

## 2022-11-29 NOTE — Progress Notes (Signed)
[facility] Progress Note   Subjective:  seen in room- for d/c today  Objective Vitals:   11/29/22 0009 11/29/22 0553 11/29/22 0800 11/29/22 1022  BP: (!) 146/73 (!) 158/81 (!) 145/76 138/69  Pulse: 72 75 96 74  Resp: 19 17 (!) 24 20  Temp: 98.7 F (37.1 C) 98.7 F (37.1 C) 98.5 F (36.9 C) 98.9 F (37.2 C)  TempSrc:  Oral Oral Oral  SpO2: 97% 95% 96% 92%  Weight:       Physical Exam General: Chronically ill appearing man, room air Heart: RRR; no murmur Lungs: CTA anteriorly Abdomen: soft Extremities: no LE edema Dialysis Access: LUE AVF + bruit  Additional Objective Labs: Basic Metabolic Panel: Recent Labs  Lab 11/27/22 0058 11/28/22 0109 11/29/22 0017  NA 135 133* 136  K 3.6 3.6 3.4*  CL 96* 95* 94*  CO2 26 22 27   GLUCOSE 183* 114* 152*  BUN 22 28* 18  CREATININE 3.40* 4.32* 3.13*  CALCIUM 8.5* 8.6* 8.7*   Liver Function Tests: Recent Labs  Lab 11/25/22 0817  AST 12*  ALT 8  ALKPHOS 96  BILITOT 0.8  PROT 6.5  ALBUMIN 3.1*   CBC: Recent Labs  Lab 11/25/22 0817 11/25/22 0846 11/25/22 0853 11/26/22 0040 11/28/22 0800  WBC 8.5  --   --  6.1 7.0  NEUTROABS 6.2  --   --   --   --   HGB 10.7*   < > 10.5* 10.5* 10.5*  HCT 35.9*   < > 31.0* 34.7* 33.9*  MCV 85.9  --   --  84.2 84.5  PLT 290  --   --  274 310   < > = values in this interval not displayed.   Blood Culture    Component Value Date/Time   SDES BLOOD RIGHT ARM 11/25/2022 0822   SPECREQUEST  11/25/2022 1610    BOTTLES DRAWN AEROBIC AND ANAEROBIC Blood Culture adequate volume   CULT (A) 11/25/2022 0822    STAPHYLOCOCCUS EPIDERMIDIS THE SIGNIFICANCE OF ISOLATING THIS ORGANISM FROM A SINGLE SET OF BLOOD CULTURES WHEN MULTIPLE SETS ARE DRAWN IS UNCERTAIN. PLEASE NOTIFY THE MICROBIOLOGY DEPARTMENT WITHIN ONE WEEK IF SPECIATION AND SENSITIVITIES ARE REQUIRED. Performed at [facility], [address], [place], [place] [address]    REPTSTATUS 11/28/2022 FINAL  11/25/2022 4098   Studies/Results: No results found. Medications:   amiodarone  200 mg Oral BID   calcium acetate  667 mg Oral TID WC   Chlorhexidine Gluconate Cloth  6 each Topical Q0600   doxazosin  2 mg Oral Daily   heparin  5,000 Units Subcutaneous Q8H   insulin aspart  0-9 Units Subcutaneous TID WC   lidocaine  1 patch Transdermal Q24H   metoprolol tartrate  25 mg Oral BID   polyethylene glycol  17 g Oral Daily   sertraline  100 mg Oral Daily    Dialysis Orders: MWF South 4h  400/600  81.2kg    LUA AVF   Heparin 5000 - no esa or vdra - last OP HD 6/12, post wt 80.5kg  - last Hb 11.2, last pth 33   CXR 6/14 - IMPRESSION: 1. Right basilar patchy opacities, which may represent atelectasis, aspiration, or pneumonia. 2. Trace right pleural effusion. 3. Cardiomegaly.    Assessment/ Plan: SVT: On admit, with CP and hypotension. Pt converted in ED spontaneously and BP since improved. Per primary team.  ESRD: Usual MWF schedule - next HD Wednesday BP/ volume - 1st  BP 70s in the ED, now back up range w/ NSR. Closer to EDW today. 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 Not on ESA as outpatient, monitor here for now. Secondary HPTH: Ca good, Phos pending. Continue home meds. DM2 - on insulin Recent hip Fx s/p ORIF on 09/10/22 Debility - getting rehab in SNF  Bufford Buttner MD 11/29/2022, 11:44 AM  [facility]

## 2022-11-29 NOTE — Discharge Summary (Signed)
Family Medicine Teaching [facility] Discharge Summary  Patient name: [name] Medical record number: [id number] Date of birth: 1955-12-18 Age: 67 y.o. Gender: male Date of Admission: 11/25/2022  Date of Discharge: 11/29/22 Admitting Physician: Shelby Mattocks, DO  Primary Care Provider: Babs Sciara, MD Consultants: Cardiology, Nephrology  Indication for Hospitalization: Supraventricular Tachycardia  Discharge Diagnoses/Problem List:  Principal Problem for Admission: Supraventricular Tachycardia Other Problems addressed during stay:  Principal Problem:   SVT (supraventricular tachycardia) Active Problems:   HTN (hypertension)   Type 2 diabetes mellitus (HCC)   ESRD (end stage renal disease) on dialysis [facility])    Brief Hospital Course:  [name] is a 67 y.o.male with a history of CAD status post CABG, HTN, T2DM, ESRD on HD, systolic and diastolic CHF, esophageal adenocarcinoma, chronic respiratory failure on 3 L [place], A-fib, PE who was admitted to the [facility] Teaching Service at [facility] for acute chest pain. His hospital course is detailed below:  SVT, demand ischemia Presented with chest pain, tachycardia, hypotension with initial EKG representing SVT although spontaneously converted in the ED.  Cardiology consulted.  Troponins were trended to peak at 600 and suspect be related to demand ischemia, by cardiology. Patient w/o chest pain during trop rise. Placed on amiodarone 200 mg BID and BB to continue outpatient. Follow up with cardiology outpatient as well, at [facility] office in 2-3 months with Dr. Ladona Ridgel.   ESRD on HD MWF Continued with HD inpatient, received HD the day of discharge prior to returning to SNF   Other chronic conditions were medically managed with home medications and formulary alternatives as necessary (ESRD on HD, T2DM, chronic respiratory failure, hip fracture, esophageal adenocarcinoma)  PCP Follow-up Recommendations:  F/u in 2-3  months with Dr. Ladona Ridgel (Cardiology). Return to regular HD schedule outpatient  Follow up with PCP, CBC and BMP on hospital follow up Pleural effusions on CT.  Disposition: SNF  Discharge Condition: stable   Discharge Exam:  Vitals:   11/29/22 0800 11/29/22 1022  BP: (!) 145/76 138/69  Pulse: 96 74  Resp: (!) 24 20  Temp: 98.5 F (36.9 C) 98.9 F (37.2 C)  SpO2: 96% 92%   General: NAD, sitting comfortably on edge of hospital bed Neuro: A&O Cardiovascular: RRR, no murmurs, no peripheral edema Respiratory: normal WOB on RA, CTAB, no wheezes, ronchi or rales Abdomen: soft, NTTP, no rebound or guarding Extremities: Moving all 4 extremities equally   Significant Procedures: Scheduled Dialysis  Significant Labs and Imaging:  Recent Labs  Lab 11/28/22 0800  WBC 7.0  HGB 10.5*  HCT 33.9*  PLT 310   Recent Labs  Lab 11/28/22 0109 11/29/22 0017  NA 133* 136  K 3.6 3.4*  CL 95* 94*  CO2 22 27  GLUCOSE 114* 152*  BUN 28* 18  CREATININE 4.32* 3.13*  CALCIUM 8.6* 8.7*   CTA Angio Chest/Abd/Pel  CHEST IMPRESSION:   1. No evidence of thoracic aortic dissection or aneurysm. 2. No pulmonary embolism. 3. Moderate RIGHT pleural effusion with RIGHT basilar atelectasis. 4. Fracture of the posterior LEFT sixth rib is age indeterminate. Fracture of the posterior RIGHT ribs are chronic.   PELVIS IMPRESSION:   1. No evidence of abdominal aortic aneurysm or dissection. 2. No acute findings in the abdomen pelvis. 3. LEFT colon diverticulosis without evidence diverticulitis. 4. RIGHT femur internal fixation  Results/Tests Pending at Time of Discharge: none  Discharge Medications:  Allergies as of 11/29/2022       Reactions  Bee Venom Anaphylaxis   Atorvastatin Other (See Comments)   myalgia   Diltiazem Itching   Lopressor [metoprolol]    NIGHTMARES. Pt tolerating this now 11/25/22   Reglan [metoclopramide] Other (See Comments)   Suicidal    Rosuvastatin Other (See  Comments)   myalgias   Valsartan Itching        Medication List     STOP taking these medications    albuterol 108 (90 Base) MCG/ACT inhaler Commonly known as: VENTOLIN HFA Replaced by: albuterol (2.5 MG/3ML) 0.083% nebulizer solution   docusate sodium 100 MG capsule Commonly known as: COLACE   enoxaparin 30 MG/0.3ML injection Commonly known as: LOVENOX   hydrALAZINE 50 MG tablet Commonly known as: APRESOLINE   Lantus 100 UNIT/ML injection Generic drug: insulin glargine   ondansetron 4 MG disintegrating tablet Commonly known as: ZOFRAN-ODT   Ondansetron HCl 4 MG/2ML Sosy   oxyCODONE 5 MG immediate release tablet Commonly known as: Oxy IR/ROXICODONE   senna 8.6 MG Tabs tablet Commonly known as: SENOKOT       TAKE these medications    acetaminophen 325 MG tablet Commonly known as: TYLENOL Take 2 tablets (650 mg total) by mouth every 6 (six) hours as needed for mild pain, moderate pain or fever.   albuterol (2.5 MG/3ML) 0.083% nebulizer solution Commonly known as: PROVENTIL Inhale 3 mLs into the lungs every 6 (six) hours as needed for wheezing or shortness of breath. Replaces: albuterol 108 (90 Base) MCG/ACT inhaler   amiodarone 200 MG tablet Commonly known as: PACERONE Take 1 tablet (200 mg total) by mouth 2 (two) times daily. What changed:  when to take this Another medication with the same name was removed. Continue taking this medication, and follow the directions you see here.   bisacodyl 10 MG suppository Commonly known as: DULCOLAX Place 1 suppository (10 mg total) rectally daily as needed for moderate constipation.   calcium acetate 667 MG capsule Commonly known as: PHOSLO Take 667 mg by mouth See admin instructions. Pt takes 1 capsule 3 times daily Tuesday,Thursday,Saturday,Sunday. All other days are twice daily   doxazosin 2 MG tablet Commonly known as: CARDURA TAKE 1 TABLET BY MOUTH DAILY   EPINEPHrine 0.3 mg/0.3 mL Soaj  injection Commonly known as: EPI-PEN Inject 0.3 mg into the muscle as needed for anaphylaxis.   fentaNYL 25 MCG/HR Commonly known as: DURAGESIC Place 1 patch onto the skin every 3 (three) days.   gabapentin 100 MG capsule Commonly known as: NEURONTIN Take 100 mg by mouth at bedtime.   HumaLOG KwikPen 100 UNIT/ML KwikPen Generic drug: insulin lispro Inject 0-15 Units into the skin 3 (three) times daily. 0-120 - 0 units, 121-150-2 units. 151-200-3 units.  201-250=5 units. 251-300=8 units 301-350=11 units 351-400=15 units. If CBG greater than 400 call MD   HYDROmorphone 2 MG tablet Commonly known as: DILAUDID Take 2 mg by mouth every 6 (six) hours as needed for severe pain.   methocarbamol 500 MG tablet Commonly known as: ROBAXIN Take 1 tablet (500 mg total) by mouth every 8 (eight) hours as needed for muscle spasms. What changed: when to take this   metoprolol tartrate 25 MG tablet Commonly known as: LOPRESSOR Take 1 tablet (25 mg total) by mouth 2 (two) times daily. In addition you can take a 3rd dose of metoprolol as needed for heart rate > 120 What changed: how much to take   nitroGLYCERIN 0.4 MG SL tablet Commonly known as: NITROSTAT Place 1 tablet (0.4 mg total) under the  tongue every 5 (five) minutes as needed for chest pain.   NUTRITIONAL SUPPLEMENT PO Take 1 Can by mouth in the morning and at bedtime.   pantoprazole 40 MG tablet Commonly known as: PROTONIX Take 1 tablet (40 mg total) by mouth 2 (two) times daily.   polyethylene glycol 17 g packet Commonly known as: MiraLax Take 17 g by mouth daily. What changed: when to take this   Repatha SureClick 140 MG/ML Soaj Generic drug: Evolocumab Inject 140 mg into the skin every 14 (fourteen) days.   sertraline 100 MG tablet Commonly known as: ZOLOFT Take 1 tablet (100 mg total) by mouth daily. What changed:  how much to take how to take this when to take this additional instructions   trimethobenzamide 300  MG capsule Commonly known as: Tigan Take 1 capsule (300 mg total) by mouth daily as needed for nausea/vomiting.        Discharge Instructions: Please refer to Patient Instructions section of EMR for full details.  Patient was counseled important signs and symptoms that should prompt return to medical care, changes in medications, dietary instructions, activity restrictions, and follow up appointments.   Follow-Up Appointments:  Follow-up Information     Babs Sciara, MD Follow up in 1 week(s).   Specialty: Family Medicine Contact information: [address] [place] [address] [phone]                 Celine Mans, MD 11/29/2022, 11:57 AM PGY-1, [facility]

## 2022-11-29 NOTE — Plan of Care (Signed)
  Problem: Education: Goal: Knowledge of General Education information will improve Description: Including pain rating scale, medication(s)/side effects and non-pharmacologic comfort measures Outcome: Progressing   Problem: Health Behavior/Discharge Planning: Goal: Ability to manage health-related needs will improve Outcome: Progressing   Problem: Activity: Goal: Risk for activity intolerance will decrease Outcome: Progressing   Problem: Nutrition: Goal: Adequate nutrition will be maintained Outcome: Progressing   Problem: Nutritional: Goal: Maintenance of adequate nutrition will improve Outcome: Progressing

## 2022-11-29 NOTE — Care Management Important Message (Signed)
Important Message  Patient Details  Name: ALOISE SHELLMAN MRN: [id number] Date of Birth: [birth year]   Medicare Important Message Given:  Yes     Sherilyn Banker 11/29/2022, 2:14 PM

## 2022-11-29 NOTE — Assessment & Plan Note (Signed)
Receives outpatient HD MWF. Continue outpatient schedule. -Nephrology following, appreciate recommendations -AM BMP 

## 2022-11-29 NOTE — Assessment & Plan Note (Signed)
Appropriate hospital CBG ranges. -Continue sensitive SSI 

## 2022-11-29 NOTE — TOC Transition Note (Signed)
Transition of Care [facility]) - CM/SW Discharge Note   Patient Details  Name: [name] MRN: [id number] Date of Birth: [birth year]  Transition of Care [facility]) CM/SW Contact:  Leander Rams, LCSW Phone Number: 11/29/2022, 3:34 PM   Clinical Narrative:    Patient will DC to:  [facility] SNF Anticipated DC date: 11/29/2022 Family notified: Amy Transport by: Sharin Mons   Per MD patient ready for DC to [facility]. RN, patient, patient's family, and facility notified of DC. Discharge Summary and FL2 sent to facility. RN to call report prior to discharge [phone]. DC packet on chart. Ambulance transport requested for patient.   CSW will sign off for now as social work intervention is no longer needed. Please consult [place] again if new needs arise.    Final next level of care: Skilled Nursing Facility Barriers to Discharge: No Barriers Identified   Patient Goals and CMS Choice      Discharge Placement                Patient chooses bed at: [facility] Patient to be transferred to facility by: [facility] Name of family member notified: Amy Patient and family notified of of transfer: 11/29/22  Discharge Plan and Services Additional resources added to the After Visit Summary for                                       Social Determinants of Health ([name]) Interventions [name] Screenings   Food Insecurity: No Food Insecurity (11/25/2022)  Housing: Low Risk  (11/25/2022)  Transportation Needs: No Transportation Needs (11/25/2022)  Recent Concern: Transportation Needs - Unmet Transportation Needs (09/09/2022)  Utilities: Not At Risk (11/25/2022)  Recent Concern: Utilities - At Risk (09/09/2022)  Alcohol Screen: Low Risk  (11/16/2022)  Depression (PHQ2-9): Low Risk  (11/16/2022)  Recent Concern: Depression (PHQ2-9) - Medium Risk (09/01/2022)  Financial Resource Strain: Low Risk  (11/16/2022)  Physical Activity: Sufficiently Active (11/16/2022)  Social Connections:  Unknown (11/16/2022)  Stress: No Stress Concern Present (11/16/2022)  Tobacco Use: Medium Risk (11/28/2022)     Readmission Risk Interventions    08/19/2022    8:11 AM 05/04/2022    9:06 AM 03/01/2022    8:14 AM  Readmission Risk Prevention Plan  Transportation Screening Complete Complete Complete  Medication Review Oceanographer) Complete Complete Complete  HRI or Home Care Consult Complete Complete Complete  SW Recovery Care/Counseling Consult Complete Complete Complete  Palliative Care Screening Not Applicable Not Applicable Not Applicable  Skilled Nursing Facility Not Applicable Not Applicable Not Applicable   Oletta Lamas, MSW, LCSWA, LCASA Transitions of Care  Clinical Social Worker I

## 2022-11-29 NOTE — Plan of Care (Signed)
[facility] Patient Discharge Orders- [facility] CLINIC: [facility]  Patient's name: [name] Admit/DC Dates: 11/25/2022 -   Discharge Diagnoses: SVT - spontaneously converted in the ED. Cardiology added amiodarone   Aranesp: Given: --   Date and amount of last dose: --  Last Hgb: 10.5 PRBC's Given: -- Date/# of units: -- ESA dose for discharge: Per clinic protocol IV Iron dose at discharge: --  Heparin change: --  EDW Change: No change    Bath Change:  Access intervention/Change: -- Details:  Hectorol/Calcitriol change: --  Discharge Labs: Calcium 8.7 Phosphorus --  Albumin 3.1 K+ 3.4  IV Antibiotics: -- Details:  On Coumadin?: -- Last INR: Next INR: Managed By:   OTHER/APPTS/LAB ORDERS:    D/C Meds to be reconciled by nurse after every discharge.  Completed By: Tomasa Blase PA-C [facility] 11/29/2022,3:56 PM   Reviewed by: MD:______ RN_______

## 2022-11-29 NOTE — Progress Notes (Signed)
Patient discharged to [facility] via [facility] at 1857 on 11/29/22 with all belongings.

## 2022-11-29 NOTE — Assessment & Plan Note (Signed)
Noted on CTA chest/abd/pelvis.  Likely chronic issue but remains on his home O2.  Would not intervene at this time. -Outpatient work-up 

## 2022-11-29 NOTE — Assessment & Plan Note (Signed)
Now in NSR, asymptomatic. Is on BB and amiodarone for rate/rhythm control.  -Cardiology signed off, appreciate recommendations -Continue metoprolol tartrate 25 mg twice daily -Continue amiodarone 200 mg BID 

## 2022-11-29 NOTE — Assessment & Plan Note (Signed)
Restart home Cardura

## 2022-11-29 NOTE — Progress Notes (Signed)
Report called to Fabian November, RN with [facility]. All questions answered.

## 2022-11-30 ENCOUNTER — Ambulatory Visit: Payer: Self-pay | Admitting: *Deleted

## 2022-11-30 ENCOUNTER — Encounter: Payer: Self-pay | Admitting: *Deleted

## 2022-11-30 LAB — CULTURE, BLOOD (ROUTINE X 2)

## 2022-11-30 NOTE — Progress Notes (Signed)
Late Note Entry  Pt was d/c after navigator left for the day on 6/18. Contacted [facility] this morning to advise clinic of pt's d/c date and that pt should resume today. Renal PA sent orders to clinic yesterday.   Olivia Canter Renal Navigator [phone]

## 2022-11-30 NOTE — Patient Outreach (Signed)
Care Coordination   Follow Up Visit Note   11/30/2022  Name: [name] MRN: [id number] DOB: 1955/11/06  [name] is a 67 y.o. year old male who sees [name], Jonna Coup, MD for primary care. I spoke with patient's significant other, [name] by phone today.  What matters to the patients health and wellness today? Receive Assistance Applying for Personal Care Services.   Goals Addressed               This Visit's Progress     Receive Assistance Applying for Personal Care Services. (pt-stated)   On track     Care Coordination Interventions:  Interventions Today    Flowsheet Row Most Recent Value  Chronic Disease   Chronic disease during today's visit Diabetes, Hypertension (HTN), Congestive Heart Failure (CHF), Chronic Kidney Disease/End Stage Renal Disease (ESRD), Other  [Major Depression, Single Episde, Severe]  General Interventions   General Interventions Discussed/Reviewed General Interventions Discussed, General Interventions Reviewed, Annual Eye Exam, Labs, Annual Foot Exam, Lipid Profile, Durable Medical Equipment (DME), Vaccines, Health Screening, [employer], Doctor Visits, Communication with, Level of Care  [facility] Care Provider & Representative with [facility]]  Labs Hgb A1c every 3 months, Kidney Function  Vaccines COVID-19, Flu, Pneumonia, RSV, Shingles  [Encouraged]  Doctor Visits Discussed/Reviewed Doctor Visits Discussed, Doctor Visits Reviewed, Annual Wellness Visits, PCP, Specialist  Health Screening Colonoscopy, Prostate  Durable Medical Equipment (DME) BP Cuff, Glucomoter, Insulin Pump, Oxygen, Walker, Psychologist, forensic  PCP/Specialist Visits Compliance with follow-up visit  Communication with PCP/Specialists  Level of Care Applications, Assisted Living, Skilled Nursing Facility, Teaching laboratory technician Personal Care Services  Exercise Interventions   Exercise Discussed/Reviewed Exercise  Discussed, Exercise Reviewed, Physical Activity, Weight Managment, Assistive device use and maintanence  Physical Activity Discussed/Reviewed Physical Activity Discussed, Physical Activity Reviewed, Types of exercise  [Encouraged]  Weight Management Weight maintenance  Education Interventions   Education Provided Provided Therapist, sports, Provided Education  Provided Verbal Education On Nutrition, Foot Care, Eye Care, Labs, Blood Sugar Monitoring, Mental Health/Coping with Illness, Applications, Exercise, Medication, When to see the doctor, [employer], Secondary school teacher  Mental Health Interventions   Mental Health Discussed/Reviewed Mental Health Discussed, Mental Health Reviewed, Coping Strategies, Crisis, Anxiety, Depression, Suicide  Nutrition Interventions   Nutrition Discussed/Reviewed Nutrition Discussed, Nutrition Reviewed, Adding fruits and vegetables, Fluid intake, Portion sizes, Decreasing sugar intake, Decreasing salt, Decreasing fats, Increaing proteins  Pharmacy Interventions   Pharmacy Dicussed/Reviewed Pharmacy Topics Discussed, Pharmacy Topics Reviewed, Medication Adherence, Affording Medications  Safety Interventions   Safety Discussed/Reviewed Safety Discussed, Safety Reviewed, Fall Risk, Home Safety  Home Engineer, water, Refer for community resources, Contact home health agency  Advanced Directive Interventions   Advanced Directives Discussed/Reviewed Advanced Directives Discussed, Advanced Directives Reviewed     Active listening & reflection utilized.  Verbalization of feelings encouraged. Emotional & caregiver support provided. Feelings of frustration validated. Caregiver stress & burnout acknowledged. Problem solving interventions indicated. Task-centered strategies activated Solution-focused activities employed. Cognitive Behavioral Therapy initiated. Acceptance & Commitment Therapy performed. CSW  collaboration with significant other, [name] to confirm patient's return to [facility] (# [phone]), on 11/29/2022, to receive long-term care services, after presenting to [facility] Department on 11/25/2022, with Hypotension & Tachycardia. CSW collaboration with significant other, [name] to explain reasons for denial of Personal Care Services, through [employer] [phone]), which include all of the following:  ~ Individual Must  Have a Medical Condition, Disability or Cognitive Impairment, &         Demonstrate Unmet Needs for Three of The Five Activities of Daily Living, with         Limited Hands-On Assistance.      (Patient Requires Hands-On Assistance with All Five Activities of Daily Living). ~ Individual Must Have a Medical Condition, Disability or Cognitive Impairment, &     Demonstrate Unmet Needs for Two of The Five Activities of Daily Living, One of     Which Requires Extensive Assistance.    (Patient Requires Extensive Assistance with All Five Activities of Daily Living). ~ Individual Must Have a Medical Condition, Disability or Cognitive Impairment, &     Demonstrate Unmet Needs for Two of The Five Activities of Daily Living, One of     Which Requires Assistance at The Full Dependence Level.    (Patient Requires Assistance at The Full Dependence Level with All Five     Activities of Daily Living). ~ The Five Activities of Daily Living Include:  Eating, Dressing, Bathing, Toileting     & Mobility. CSW collaboration with significant other, [name] to encourage direct contact with CSW (# [phone]), if she has questions, needs assistance, or if additional social work needs are identified between now & our next scheduled follow-up outreach call.      [name] assessments and interventions completed:  Yes.  Care Coordination Interventions:  Yes, provided.   Follow up plan: Follow up call scheduled for 12/13/2022 at  1:45 pm.   Encounter Outcome:  Pt. Visit Completed.   Danford Bad, BSW, MSW, LCSW  Licensed Restaurant manager, [employer] [facility]  Mailing [place] [address] [place], [place] [address] Physical Address-[address], [place], [place] [address] Toll Free Main # [phone] Fax # [phone] Cell # [phone] [email or web address]

## 2022-11-30 NOTE — Patient Instructions (Signed)
Visit Information  Thank you for taking time to visit with me today. Please don't hesitate to contact me if I can be of assistance to you.   Following are the goals we discussed today:   Goals Addressed               This Visit's Progress     Receive Assistance Applying for Personal Care Services. (pt-stated)   On track     Care Coordination Interventions:  Interventions Today    Flowsheet Row Most Recent Value  Chronic Disease   Chronic disease during today's visit Diabetes, Hypertension (HTN), Congestive Heart Failure (CHF), Chronic Kidney Disease/End Stage Renal Disease (ESRD), Other  [Major Depression, Single Episde, Severe]  General Interventions   General Interventions Discussed/Reviewed General Interventions Discussed, General Interventions Reviewed, Annual Eye Exam, Labs, Annual Foot Exam, Lipid Profile, Durable Medical Equipment (DME), Vaccines, Health Screening, [employer], Doctor Visits, Communication with, Level of Care  [facility] Care Provider & Representative with [facility]]  Labs Hgb A1c every 3 months, Kidney Function  Vaccines COVID-19, Flu, Pneumonia, RSV, Shingles  [Encouraged]  Doctor Visits Discussed/Reviewed Doctor Visits Discussed, Doctor Visits Reviewed, Annual Wellness Visits, PCP, Specialist  Health Screening Colonoscopy, Prostate  Durable Medical Equipment (DME) BP Cuff, Glucomoter, Insulin Pump, Oxygen, Walker, Psychologist, forensic  PCP/Specialist Visits Compliance with follow-up visit  Communication with PCP/Specialists  Level of Care Applications, Assisted Living, Skilled Nursing Facility, Teaching laboratory technician Personal Care Services  Exercise Interventions   Exercise Discussed/Reviewed Exercise Discussed, Exercise Reviewed, Physical Activity, Weight Managment, Assistive device use and maintanence  Physical Activity Discussed/Reviewed Physical Activity Discussed, Physical Activity Reviewed, Types of exercise   [Encouraged]  Weight Management Weight maintenance  Education Interventions   Education Provided Provided Therapist, sports, Provided Education  Provided Verbal Education On Nutrition, Foot Care, Eye Care, Labs, Blood Sugar Monitoring, Mental Health/Coping with Illness, Applications, Exercise, Medication, When to see the doctor, [employer], Secondary school teacher  Mental Health Interventions   Mental Health Discussed/Reviewed Mental Health Discussed, Mental Health Reviewed, Coping Strategies, Crisis, Anxiety, Depression, Suicide  Nutrition Interventions   Nutrition Discussed/Reviewed Nutrition Discussed, Nutrition Reviewed, Adding fruits and vegetables, Fluid intake, Portion sizes, Decreasing sugar intake, Decreasing salt, Decreasing fats, Increaing proteins  Pharmacy Interventions   Pharmacy Dicussed/Reviewed Pharmacy Topics Discussed, Pharmacy Topics Reviewed, Medication Adherence, Affording Medications  Safety Interventions   Safety Discussed/Reviewed Safety Discussed, Safety Reviewed, Fall Risk, Home Safety  Home Engineer, water, Refer for community resources, Contact home health agency  Advanced Directive Interventions   Advanced Directives Discussed/Reviewed Advanced Directives Discussed, Advanced Directives Reviewed     Active listening & reflection utilized.  Verbalization of feelings encouraged. Emotional & caregiver support provided. Feelings of frustration validated. Caregiver stress & burnout acknowledged. Problem solving interventions indicated. Task-centered strategies activated Solution-focused activities employed. Cognitive Behavioral Therapy initiated. Acceptance & Commitment Therapy performed. CSW collaboration with significant other, [name] to confirm patient's return to [facility] (# [phone]), on 11/29/2022, to receive long-term care services, after presenting to the [facility]  Department on 11/25/2022, with Hypotension & Tachycardia. CSW collaboration with significant other, [name] to explain reasons for denial of Personal Care Services, through [employer] [phone]), which include all of the following:  ~ Individual Must Have a Medical Condition, Disability or Cognitive Impairment, &         Demonstrate Unmet Needs for Three of The Five Activities of Daily Living, with  Limited Hands-On Assistance.      (Patient Requires Hands-On Assistance with All Five Activities of Daily Living). ~ Individual Must Have a Medical Condition, Disability or Cognitive Impairment, &     Demonstrate Unmet Needs for Two of The Five Activities of Daily Living, One of     Which Requires Extensive Assistance.    (Patient Requires Extensive Assistance with All Five Activities of Daily Living). ~ Individual Must Have a Medical Condition, Disability or Cognitive Impairment, &     Demonstrate Unmet Needs for Two of The Five Activities of Daily Living, One of     Which Requires Assistance at The Full Dependence Level.    (Patient Requires Assistance at The Full Dependence Level with All Five     Activities of Daily Living). ~ The Five Activities of Daily Living Include:  Eating, Dressing, Bathing, Toileting     & Mobility. CSW collaboration with significant other, [name] to encourage direct contact with CSW (# [phone]), if she has questions, needs assistance, or if additional social work needs are identified between now & our next scheduled follow-up outreach call.      Our next appointment is by telephone on 12/13/2022 at 1:45 pm.   Please call the care guide team at [phone] if you need to cancel or reschedule your appointment.   If you are experiencing a Mental Health or Behavioral Health Crisis or need someone to talk to, please call the Suicide and Crisis Lifeline: 988 call the [place] National Suicide Prevention Lifeline: 503-596-4004 or TTY:  706-193-0984 TTY (304)805-2642) to talk to a trained counselor call 1-800-273-TALK (toll free, 24 hour hotline) go to [facility] [address] [place] [phone]) call the [facility] Crisis Line: [phone] call 911  Patient verbalizes understanding of instructions and care plan provided today and agrees to view in MyChart. Active MyChart status and patient understanding of how to access instructions and care plan via MyChart confirmed with patient.     Telephone follow up appointment with care management team member scheduled for:  12/13/2022 at 1:45 pm.   Danford Bad, BSW, MSW, LCSW  Licensed Clinical Social Worker  [facility]  Mailing [place]. [address], [place], [place] [address] Physical Address[address], [place], [place] [address] Toll Free Main # [phone] Fax # [phone] Cell # [phone] [email or web address]

## 2022-12-02 NOTE — Telephone Encounter (Signed)
I have sent communication to Alyse Low nurse coordinator GI services to help clarify what procedure they want him to complete and when Awaiting a reply Her phone number [phone] Fax number [phone]

## 2022-12-13 ENCOUNTER — Encounter: Payer: Self-pay | Admitting: *Deleted

## 2022-12-13 ENCOUNTER — Ambulatory Visit: Payer: Self-pay | Admitting: *Deleted

## 2022-12-13 NOTE — Patient Outreach (Signed)
Care Coordination   Follow Up Visit Note   12/13/2022  Name: [name] MRN: [id number] DOB: 01-28-1956  [name] is a 67 y.o. year old male who sees [name], Jonna Coup, MD for primary care. I spoke with patient's significant other, [name] by phone today.  What matters to the patients health and wellness today?  Receive Assistance Applying for Personal Care Services.   Goals Addressed               This Visit's Progress     Receive Assistance Applying for Personal Care Services. (pt-stated)   On track     Care Coordination Interventions:  Interventions Today    Flowsheet Row Most Recent Value  Chronic Disease   Chronic disease during today's visit Diabetes, Hypertension (HTN), Congestive Heart Failure (CHF), Chronic Kidney Disease/End Stage Renal Disease (ESRD), Other  [Major Depression, Single Episde, Severe]  General Interventions   General Interventions Discussed/Reviewed General Interventions Discussed, General Interventions Reviewed, Annual Eye Exam, Labs, Annual Foot Exam, Lipid Profile, Durable Medical Equipment (DME), Vaccines, Health Screening, [facility], Doctor Visits, Communication with, Level of Care  [facility] Care Provider & Representative with [facility]]  Labs Hgb A1c every 3 months, Kidney Function  Vaccines COVID-19, Flu, Pneumonia, RSV, Shingles  [Encouraged]  Doctor Visits Discussed/Reviewed Doctor Visits Discussed, Doctor Visits Reviewed, Annual Wellness Visits, PCP, Specialist  Health Screening Colonoscopy, Prostate  Durable Medical Equipment (DME) BP Cuff, Glucomoter, Insulin Pump, Oxygen, Walker, Psychologist, forensic  PCP/Specialist Visits Compliance with follow-up visit  Communication with PCP/Specialists  Level of Care Applications, Assisted Living, Skilled Nursing Facility, Teaching laboratory technician Personal Care Services  Exercise Interventions   Exercise Discussed/Reviewed Exercise  Discussed, Exercise Reviewed, Physical Activity, Weight Managment, Assistive device use and maintanence  Physical Activity Discussed/Reviewed Physical Activity Discussed, Physical Activity Reviewed, Types of exercise  [Encouraged]  Weight Management Weight maintenance  Education Interventions   Education Provided Provided Therapist, sports, Provided Education  Provided Verbal Education On Nutrition, Foot Care, Eye Care, Labs, Blood Sugar Monitoring, Mental Health/Coping with Illness, Applications, Exercise, Medication, When to see the doctor, [employer], Secondary school teacher  Mental Health Interventions   Mental Health Discussed/Reviewed Mental Health Discussed, Mental Health Reviewed, Coping Strategies, Crisis, Anxiety, Depression, Suicide  Nutrition Interventions   Nutrition Discussed/Reviewed Nutrition Discussed, Nutrition Reviewed, Adding fruits and vegetables, Fluid intake, Portion sizes, Decreasing sugar intake, Decreasing salt, Decreasing fats, Increaing proteins  Pharmacy Interventions   Pharmacy Dicussed/Reviewed Pharmacy Topics Discussed, Pharmacy Topics Reviewed, Medication Adherence, Affording Medications  Safety Interventions   Safety Discussed/Reviewed Safety Discussed, Safety Reviewed, Fall Risk, Home Safety  Home Engineer, water, Refer for community resources, Contact home health agency  Advanced Directive Interventions   Advanced Directives Discussed/Reviewed Advanced Directives Discussed, Advanced Directives Reviewed     Active listening & reflection utilized.  Verbalization of feelings encouraged. Emotional & caregiver support provided. Feelings of frustration validated. Caregiver stress & burnout acknowledged. Problem solving interventions indicated. Task-centered strategies activated Solution-focused activities employed. Cognitive Behavioral Therapy initiated. Acceptance & Commitment Therapy performed. CSW  collaboration with significant other, [name] to confirm patient's continued residency at [facility] (# [phone]), to receive rehabilitative services.   CSW collaboration with significant other, [name] to confirm patient's desire to return home to live independently, upon medical clearance & discharge from [facility] (# [phone]). CSW collaboration with significant other, [name] to assist with re-applying for Personal Care Services &  submission of Personal Care Services application to [facility] [phone]), for processing. CSW collaboration with representative from [facility] [phone]), to confirm receipt of Personal Care Services application for processing & scheduling of initial home assessment for mid August. CSW collaboration with significant other, [name] to encourage direct contact with CSW (# [phone]), if she has questions, needs assistance, or if additional social work needs are identified between now & our next scheduled follow-up outreach call.      [name] assessments and interventions completed:  Yes.  Care Coordination Interventions:  Yes, provided.   Follow up plan: Follow up call scheduled for 12/27/2022 at 3:15 pm.  Encounter Outcome:  Pt. Visit Completed.   Danford Bad, BSW, MSW, LCSW  Licensed Restaurant manager, [employer] [facility]  Mailing [place] [place], [place] [address] Physical Address[address] [place], [place] [address] Toll Free Main # [phone] Fax # [phone] Cell # [phone] [email or web address]

## 2022-12-13 NOTE — Patient Instructions (Signed)
Visit Information  Thank you for taking time to visit with me today. Please don't hesitate to contact me if I can be of assistance to you.   Following are the goals we discussed today:   Goals Addressed               This Visit's Progress     Receive Assistance Applying for Personal Care Services. (pt-stated)   On track     Care Coordination Interventions:  Interventions Today    Flowsheet Row Most Recent Value  Chronic Disease   Chronic disease during today's visit Diabetes, Hypertension (HTN), Congestive Heart Failure (CHF), Chronic Kidney Disease/End Stage Renal Disease (ESRD), Other  [Major Depression, Single Episde, Severe]  General Interventions   General Interventions Discussed/Reviewed General Interventions Discussed, General Interventions Reviewed, Annual Eye Exam, Labs, Annual Foot Exam, Lipid Profile, Durable Medical Equipment (DME), Vaccines, Health Screening, [facility], Doctor Visits, Communication with, Level of Care  [facility] Care Provider & Representative with [facility]]  Labs Hgb A1c every 3 months, Kidney Function  Vaccines COVID-19, Flu, Pneumonia, RSV, Shingles  [Encouraged]  Doctor Visits Discussed/Reviewed Doctor Visits Discussed, Doctor Visits Reviewed, Annual Wellness Visits, PCP, Specialist  Health Screening Colonoscopy, Prostate  Durable Medical Equipment (DME) BP Cuff, Glucomoter, Insulin Pump, Oxygen, Walker, Psychologist, forensic  PCP/Specialist Visits Compliance with follow-up visit  Communication with PCP/Specialists  Level of Care Applications, Assisted Living, Skilled Nursing Facility, Teaching laboratory technician Personal Care Services  Exercise Interventions   Exercise Discussed/Reviewed Exercise Discussed, Exercise Reviewed, Physical Activity, Weight Managment, Assistive device use and maintanence  Physical Activity Discussed/Reviewed Physical Activity Discussed, Physical Activity Reviewed, Types of exercise   [Encouraged]  Weight Management Weight maintenance  Education Interventions   Education Provided Provided Therapist, sports, Provided Education  Provided Verbal Education On Nutrition, Foot Care, Eye Care, Labs, Blood Sugar Monitoring, Mental Health/Coping with Illness, Applications, Exercise, Medication, When to see the doctor, [facility], Secondary school teacher  Mental Health Interventions   Mental Health Discussed/Reviewed Mental Health Discussed, Mental Health Reviewed, Coping Strategies, Crisis, Anxiety, Depression, Suicide  Nutrition Interventions   Nutrition Discussed/Reviewed Nutrition Discussed, Nutrition Reviewed, Adding fruits and vegetables, Fluid intake, Portion sizes, Decreasing sugar intake, Decreasing salt, Decreasing fats, Increaing proteins  Pharmacy Interventions   Pharmacy Dicussed/Reviewed Pharmacy Topics Discussed, Pharmacy Topics Reviewed, Medication Adherence, Affording Medications  Safety Interventions   Safety Discussed/Reviewed Safety Discussed, Safety Reviewed, Fall Risk, Home Safety  Home Engineer, water, Refer for community resources, Contact home health agency  Advanced Directive Interventions   Advanced Directives Discussed/Reviewed Advanced Directives Discussed, Advanced Directives Reviewed     Active listening & reflection utilized.  Verbalization of feelings encouraged. Emotional & caregiver support provided. Feelings of frustration validated. Caregiver stress & burnout acknowledged. Problem solving interventions indicated. Task-centered strategies activated Solution-focused activities employed. Cognitive Behavioral Therapy initiated. Acceptance & Commitment Therapy performed. CSW collaboration with significant other, [name] to confirm patient's continued residency at [facility] (# [phone]), to receive rehabilitative services.   CSW collaboration with significant other, [name] to confirm patient's desire to return home to live independently, upon medical clearance & discharge from [facility] (# [phone]). CSW collaboration with significant other, [name] to assist with re-applying for Personal Care Services & submission of Personal Care Services application to [facility] [phone]), for processing. CSW collaboration with representative from [facility] [phone]), to confirm receipt of Personal Care Services application for processing &  scheduling of initial home assessment for mid August. CSW collaboration with significant other, [name] to encourage direct contact with CSW (# [phone]), if she has questions, needs assistance, or if additional social work needs are identified between now & our next scheduled follow-up outreach call.      Our next appointment is by telephone on 12/27/2022 at 3:15 pm.  Please call the care guide team at [phone] if you need to cancel or reschedule your appointment.   If you are experiencing a Mental Health or Behavioral Health Crisis or need someone to talk to, please call the Suicide and Crisis Lifeline: 988 call the [place] National Suicide Prevention Lifeline: (651) 579-1447 or TTY: 8506855819 TTY 701 514 1779) to talk to a trained counselor call 1-800-273-TALK (toll free, 24 hour hotline) go to [facility] [address] [place] [phone]) call the [facility] Crisis Line: ([phone] call 911  Patient verbalizes understanding of instructions and care plan provided today and agrees to view in MyChart. Active MyChart status and patient understanding of how to access instructions and care plan via MyChart confirmed with patient.     Telephone follow up appointment with care management team member scheduled for:  12/27/2022 at 3:15 pm.  Danford Bad, BSW, MSW, LCSW  Licensed Clinical Social Worker  [facility]  Mailing [place]. [address] [place], [place] [address] Physical Address-[address] [place], [place] [address] Toll Free Main # [phone] Fax # [phone] Cell # [phone] [email or web address]

## 2022-12-14 ENCOUNTER — Ambulatory Visit: Payer: 59 | Admitting: Internal Medicine

## 2022-12-27 ENCOUNTER — Encounter: Payer: Self-pay | Admitting: *Deleted

## 2022-12-27 ENCOUNTER — Ambulatory Visit: Payer: Self-pay | Admitting: *Deleted

## 2022-12-27 ENCOUNTER — Ambulatory Visit: Payer: Medicare Other | Attending: Internal Medicine | Admitting: Internal Medicine

## 2022-12-27 VITALS — BP 118/56 | HR 67 | Ht 70.0 in | Wt 180.0 lb

## 2022-12-27 DIAGNOSIS — I471 Supraventricular tachycardia, unspecified: Secondary | ICD-10-CM | POA: Insufficient documentation

## 2022-12-27 MED ORDER — AMIODARONE HCL 200 MG PO TABS: 200.0000 mg | ORAL_TABLET | Freq: Every day | ORAL | 3 refills | Status: DC

## 2022-12-27 NOTE — Progress Notes (Addendum)
HPI Mr. Tullius returns today for followup. 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 The patient was placed on amiodarone as his SVT was fairly incessant and he was not thought to be a good candidate for ablation. His heart racing has been quiet.  Allergies  Allergen Reactions   Bee Venom Anaphylaxis   Atorvastatin Other (See Comments)    myalgia   Diltiazem Itching   Lopressor [Metoprolol]     NIGHTMARES. Pt tolerating this now 11/25/22   Reglan [Metoclopramide] Other (See Comments)    Suicidal    Rosuvastatin Other (See Comments)    myalgias   Valsartan Itching     Current Outpatient Medications  Medication Sig Dispense Refill   albuterol (PROVENTIL) (2.5 MG/3ML) 0.083% nebulizer solution Inhale 3 mLs into the lungs every 6 (six) hours as needed for wheezing or shortness of breath. 75 mL 12   amiodarone (PACERONE) 200 MG tablet Take 1 tablet (200 mg total) by mouth 2 (two) times daily. 60 tablet 0   aspirin EC 325 MG tablet 325 mg every 4 (four) hours as needed.     bisacodyl (DULCOLAX) 10 MG suppository Place 1 suppository (10 mg total) rectally daily as needed for moderate constipation. 20 suppository 0   calcium acetate (PHOSLO) 667 MG capsule Take 667 mg by mouth See admin instructions. Pt takes 1 capsule 3 times daily Tuesday,Thursday,Saturday,Sunday. All other days are twice daily     doxazosin (CARDURA) 2 MG tablet TAKE 1 TABLET BY MOUTH DAILY 30 tablet 5   EPINEPHrine 0.3 mg/0.3 mL IJ SOAJ injection Inject 0.3 mg into the muscle as needed for anaphylaxis. 1 each 4   fentaNYL (DURAGESIC) 25 MCG/HR Place 1 patch onto the skin every 3 (three) days.     gabapentin (NEURONTIN) 100 MG capsule Take 100 mg by mouth at bedtime.     HYDROmorphone (DILAUDID) 2 MG tablet Take 2 mg by mouth every 6 (six) hours as needed for severe pain.     insulin glargine (LANTUS SOLOSTAR) 100 UNIT/ML Solostar Pen daily.     insulin lispro (HUMALOG KWIKPEN)  100 UNIT/ML KwikPen Inject 0-15 Units into the skin 3 (three) times daily. 0-120 - 0 units, 121-150-2 units. 151-200-3 units.  201-250=5 units. 251-300=8 units 301-350=11 units 351-400=15 units. If CBG greater than 400 call MD     methocarbamol (ROBAXIN) 500 MG tablet Take 1 tablet (500 mg total) by mouth every 8 (eight) hours as needed for muscle spasms. (Patient taking differently: Take 500 mg by mouth daily.) 20 tablet 0   metoprolol tartrate (LOPRESSOR) 25 MG tablet Take 1 tablet (25 mg total) by mouth 2 (two) times daily. In addition you can take a 3rd dose of metoprolol as needed for heart rate > 120 (Patient taking differently: Take 12.5 mg by mouth 2 (two) times daily. In addition you can take a 3rd dose of metoprolol as needed for heart rate > 120) 60 tablet 1   nitroGLYCERIN (NITROSTAT) 0.4 MG SL tablet Place 1 tablet (0.4 mg total) under the tongue every 5 (five) minutes as needed for chest pain. 25 tablet 2   Nutritional Supplements (NUTRITIONAL SUPPLEMENT PO) Take 1 Can by mouth in the morning and at bedtime.     pantoprazole (PROTONIX) 40 MG tablet Take 1 tablet (40 mg total) by mouth 2 (two) times daily. 60 tablet 1   polyethylene glycol (MIRALAX) 17 g packet Take 17 g  by mouth daily. 14 each 0   sertraline (ZOLOFT) 100 MG tablet Take 1 tablet (100 mg total) by mouth daily. 30 tablet 0   trimethobenzamide (TIGAN) 300 MG capsule Take 1 capsule (300 mg total) by mouth daily as needed for nausea/vomiting. 30 capsule 0   acetaminophen (TYLENOL) 325 MG tablet Take 2 tablets (650 mg total) by mouth every 6 (six) hours as needed for mild pain, moderate pain or fever. (Patient not taking: Reported on 12/27/2022) 30 tablet 0   Evolocumab (REPATHA SURECLICK) 140 MG/ML SOAJ Inject 140 mg into the skin every 14 (fourteen) days. (Patient not taking: Reported on 12/27/2022) 6 mL 1   No current facility-administered medications for this visit.     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   Past Surgical History:  Procedure Laterality Date   AV FISTULA PLACEMENT Left 12/21/2021   Procedure: LEFT ARM ARTERIOVENOUS (AV) FISTULA CREATION;  Surgeon: Larina Earthly, MD;  Location: [facility] OR[facility];  Service: Vascular;  Laterality: Left;   BIOPSY  06/27/2019   Procedure: BIOPSY;  Surgeon: Malissa Hippo, MD;  [facility];  Service: Endoscopy;;  ascending colon polyp   BIOPSY  10/21/2020   Procedure: BIOPSY;  Surgeon: Malissa Hippo, MD;  [facility];  Service: Endoscopy;;  esophageal,gastric polyp   BIOPSY  02/24/2021   Procedure: BIOPSY;  Surgeon: Malissa Hippo, MD;  [facility];  Service: Endoscopy;;  distal and proximal esophageal biopsies    BIOPSY  05/30/2022   Procedure: BIOPSY;  Surgeon: Lanelle Bal, DO;  [facility];  Service: Endoscopy;;   CARDIAC SURGERY     CATARACT EXTRACTION     COLONOSCOPY N/A 06/27/2019   [name]:Diverticulosis in the entire examined colon. tubular adenoma in ascending colon, 4mm tubular adenoma in prox sigmoid. external hemorrhoids   CORONARY ARTERY BYPASS GRAFT N/A 03/25/2019   Procedure: CORONARY ARTERY BYPASS GRAFTING (CABG) X  4 USING LEFT INTERNAL MAMMARY ARTERY AND RIGHT SAPHENOUS VEIN GRAFTS;  Surgeon: Corliss Skains, MD;  Location: [facility] OR;  Service:  Open Heart Surgery;  Laterality: N/A;   ESOPHAGEAL BRUSHING  03/08/2022   Procedure: ESOPHAGEAL BRUSHING;  Surgeon: Lanelle Bal, DO;  [facility];  Service: Endoscopy;;   ESOPHAGOGASTRODUODENOSCOPY (EGD) WITH PROPOFOL N/A 10/21/2020   rehman:Normal hypopharynx.Normal proximal esophagus and mid esophagus.Esophageal mucosal changes consistent with long-segment Barrett's esophagus. (Focal low-grade dysplasia and atypia proximally) z line irregular 37 cm from incisors, 3 cm HH, single gastric polyp (fundic gland) normal duodenal bulb/second portion of duodenum, proximal margin of Barrett's at 34 cm   ESOPHAGOGASTRODUODENOSCOPY (EGD) WITH PROPOFOL N/A 02/24/2021   Procedure: ESOPHAGOGASTRODUODENOSCOPY (EGD) WITH PROPOFOL;  Surgeon: Malissa Hippo, MD;  [facility];  Service: Endoscopy;  Laterality: N/A;  1:35   ESOPHAGOGASTRODUODENOSCOPY (EGD) WITH PROPOFOL N/A 03/21/2021   Procedure: ESOPHAGOGASTRODUODENOSCOPY (EGD) WITH PROPOFOL;  Surgeon: Jeani Hawking, MD;  Location: [facility] ENDOSCOPY;  Service: Endoscopy;  Laterality: N/A;   ESOPHAGOGASTRODUODENOSCOPY (EGD) WITH PROPOFOL N/A 10/23/2021   Procedure: ESOPHAGOGASTRODUODENOSCOPY (EGD) WITH PROPOFOL;  Surgeon: Sherrilyn Rist, MD;  Location: [facility] ENDOSCOPY;  Service: Gastroenterology;  Laterality: N/A;   ESOPHAGOGASTRODUODENOSCOPY (EGD) WITH PROPOFOL N/A 03/08/2022   Procedure: ESOPHAGOGASTRODUODENOSCOPY (EGD) WITH PROPOFOL;  Surgeon: Lanelle Bal, DO;  [facility];  Service: Endoscopy;  Laterality: N/A;   ESOPHAGOGASTRODUODENOSCOPY (EGD) WITH PROPOFOL N/A 05/27/2022   Procedure: ESOPHAGOGASTRODUODENOSCOPY (EGD) WITH PROPOFOL;  Surgeon: Dolores Frame, MD;  [facility];  Service: Gastroenterology;  Laterality: N/A;   ESOPHAGOGASTRODUODENOSCOPY (EGD) WITH PROPOFOL N/A 05/30/2022   Procedure: ESOPHAGOGASTRODUODENOSCOPY (EGD) WITH PROPOFOL;  Surgeon: Lanelle Bal, DO;  [facility];  Service: Endoscopy;  Laterality: N/A;   EXCISION MORTON'S NEUROMA     EYE SURGERY Left    retina   INCISION AND DRAINAGE OF WOUND Right 06/28/2021   Procedure: IRRIGATION AND DEBRIDEMENT WOUND;  Surgeon: Allena Napoleon, MD;  [facility];  Service: Plastics;  Laterality: Right;  1.5 hour   INSERTION OF DIALYSIS CATHETER Right 12/21/2021   Procedure: INSERTION OF TUNNELED DIALYSIS CATHETER;  Surgeon: Larina Earthly, MD;  Location: [facility];  Service: Vascular;  Laterality: Right;   INTRAMEDULLARY (IM) NAIL INTERTROCHANTERIC Right 09/10/2022   Procedure: INTRAMEDULLARY (IM) NAIL INTERTROCHANTERIC;  Surgeon: Joen Laura, MD;  [facility];  Service: Orthopedics;  Laterality: Right;   LEFT HEART CATH AND CORS/GRAFTS ANGIOGRAPHY N/A 02/01/2022   Procedure: LEFT HEART CATH AND CORS/GRAFTS ANGIOGRAPHY;  Surgeon: Swaziland, Peter M, MD;  Location: [facility] INVASIVE CV LAB;  Service: Cardiovascular;  Laterality: N/A;   POLYPECTOMY  06/27/2019   Procedure: POLYPECTOMY;  Surgeon: Malissa Hippo, MD;  [facility];  Service: Endoscopy;;  proximal sigmoid colon   RIGHT/LEFT HEART CATH AND CORONARY ANGIOGRAPHY N/A 03/12/2019   Procedure: RIGHT/LEFT HEART CATH AND CORONARY ANGIOGRAPHY;  Surgeon: Corky Crafts, MD;  Location: [facility] INVASIVE CV LAB;  Service: Cardiovascular;  Laterality: N/A;   SHOULDER ARTHROSCOPY WITH ROTATOR CUFF REPAIR AND SUBACROMIAL DECOMPRESSION Right 08/26/2020   Procedure: RIGHT SHOULDER MINI OPEN ROTATOR CUFF REPAIR AND SUBACROMIAL DECOMPRESSION WITH PATCH GRAFT;  Surgeon: Jene Every, MD;  Location: [facility] ORS;  Service: Orthopedics;  Laterality: Right;  90 MINS GENERAL WITH BLOCK   SKIN SPLIT GRAFT Right 06/28/2021   Procedure: SKIN GRAFT SPLIT THICKNESS;  Surgeon: Allena Napoleon, MD;  Location[facility];  Service: Plastics;  Laterality: Right;   TEE WITHOUT CARDIOVERSION N/A 03/25/2019   Procedure: TRANSESOPHAGEAL ECHOCARDIOGRAM (TEE);  Surgeon: Corliss Skains, MD;  Location: [facility] OR;  Service: Open Heart Surgery;  Laterality: N/A;     Family History  Problem Relation Age of Onset   Colon cancer Mother    Heart Problems Father    Diabetes Father    Valvular heart disease Father    Sleep apnea Neg Hx    Stroke Neg Hx      Social History   Socioeconomic History   Marital status: Single    Spouse name: Not on file   Number of children: 0   Years of education: 58   Highest education level: 12th grade  Occupational History   Not on file  Tobacco Use  Smoking status: Former    Current packs/day: 0.00    Types: Cigarettes    Quit date: 07/06/1988    Years since quitting: 34.4    Passive exposure: Past   Smokeless tobacco: Never  Vaping Use   Vaping status: Never Used  Substance and Sexual Activity   Alcohol use: Not Currently    Alcohol/week: 2.0 standard drinks of alcohol    Types: 2 Glasses of wine per week    Comment: socially   Drug use: No   Sexual activity: Not Currently    Partners: Female  Other Topics Concern   Not on file  Social History Narrative   Not on file   Social Determinants of Health   Financial Resource Strain: Low Risk  (11/16/2022)   Overall Financial Resource Strain (CARDIA)    Difficulty of Paying Living Expenses: Not hard at all  Food Insecurity: No Food Insecurity (11/25/2022)   Hunger Vital Sign    Worried About Running Out of Food in the Last Year: Never true    Ran Out of Food in the Last Year: Never true  Transportation Needs: No Transportation Needs (11/25/2022)   PRAPARE - Administrator, Civil Service (Medical): No    Lack of Transportation (Non-Medical): No  Recent Concern: Transportation Needs - Unmet Transportation Needs (09/09/2022)   PRAPARE - Administrator, Civil Service (Medical): Yes    Lack of Transportation (Non-Medical): No  Physical Activity: Sufficiently Active (11/16/2022)   Exercise Vital Sign    Days of Exercise per Week: 5 days    Minutes  of Exercise per Session: 50 min  Stress: No Stress Concern Present (11/16/2022)   [employer] of Occupational Health - Occupational Stress Questionnaire    Feeling of Stress : Only a little  Social Connections: Unknown (11/16/2022)   Social Connection and Isolation Panel [NHANES]    Frequency of Communication with Friends and Family: More than three times a week    Frequency of Social Gatherings with Friends and Family: More than three times a week    Attends Religious Services: More than 4 times per year    Active Member of [employer] or Organizations: Yes    Attends Banker Meetings: More than 4 times per year    Marital Status: Not on file  Intimate Partner Violence: Not At Risk (11/25/2022)   Humiliation, Afraid, Rape, and Kick questionnaire    Fear of Current or Ex-Partner: No    Emotionally Abused: No    Physically Abused: No    Sexually Abused: No     BP (!) 118/56 (BP Location: Left Arm, Patient Position: Sitting, Cuff Size: Normal)   Pulse 67   Ht 5\' 10"  (1.778 m)   Wt 180 lb (81.6 kg)   SpO2 (!) 88% Comment: NORMALLY ON AIR IN ROOM  BMI 25.83 kg/m   Physical Exam:  Well appearing NAD HEENT: Unremarkable Neck:  No JVD, no thyromegally Lymphatics:  No adenopathy Back:  No CVA tenderness Lungs:  Clear with no wheezes HEART:  Regular rate rhythm, no murmurs, no rubs, no clicks Abd:  soft, positive bowel sounds, no organomegally, no rebound, no guarding Ext:  2 plus pulses, no edema, no cyanosis, no clubbing Skin:  No rashes no nodules Neuro:  CN II through XII intact, motor grossly intact  EKG - nsr  DEVICE  Normal device function.  See PaceArt for details.   Assess/Plan: SVT - he is well controlled. I asked him  to reduce the amiodarone to 200 mg daily. I will see him back in 6 months. Preop eval - the patient is an acceptable risk for invasive procedures. No additional testing needed.  Sharlot Gowda Dutchess Crosland,MD

## 2022-12-27 NOTE — Patient Instructions (Signed)
Medication Instructions:  Your physician has recommended you make the following change in your medication:   Decrease Amiodarone to 200 mg Daily   *If you need a refill on your cardiac medications before your next appointment, please call your pharmacy*   Lab Work: NONE   If you have labs (blood work) drawn today and your tests are completely normal, you will receive your results only by: MyChart Message (if you have MyChart) OR A paper copy in the mail If you have any lab test that is abnormal or we need to change your treatment, we will call you to review the results.   Testing/Procedures: NONE    Follow-Up: At [facility], you and your health needs are our priority.  As part of our continuing mission to provide you with exceptional heart care, we have created designated Provider Care Teams.  These Care Teams include your primary Cardiologist (physician) and Advanced Practice Providers (APPs -  Physician Assistants and Nurse Practitioners) who all work together to provide you with the care you need, when you need it.  We recommend signing up for the patient portal called "MyChart".  Sign up information is provided on this After Visit Summary.  MyChart is used to connect with patients for Virtual Visits (Telemedicine).  Patients are able to view lab/test results, encounter notes, upcoming appointments, etc.  Non-urgent messages can be sent to your provider as well.   To learn more about what you can do with MyChart, go to [email or web address].    Your next appointment:   6 month(s)  Provider:   Lewayne Bunting, MD    Other Instructions Thank you for choosing [facility]!

## 2022-12-27 NOTE — Patient Outreach (Signed)
Care Coordination   Follow Up Visit Note   12/27/2022  Name: [name] MRN: [id number] DOB: 04/17/1956  [name] is a 67 y.o. year old male who sees [name], Jonna Coup, MD for primary care. I spoke with patient's significant other, [name] by phone today.  What matters to the patients health and wellness today?  Receive Assistance Applying for Personal Care Services.   Goals Addressed               This Visit's Progress     Receive Assistance Applying for Personal Care Services. (pt-stated)   On track     Care Coordination Interventions:  Interventions Today    Flowsheet Row Most Recent Value  Chronic Disease   Chronic disease during today's visit Diabetes, Hypertension (HTN), Congestive Heart Failure (CHF), Chronic Kidney Disease/End Stage Renal Disease (ESRD), Other  [Major Depression, Single Episde, Severe]  General Interventions   General Interventions Discussed/Reviewed General Interventions Discussed, General Interventions Reviewed, Annual Eye Exam, Labs, Annual Foot Exam, Lipid Profile, Durable Medical Equipment (DME), Vaccines, Health Screening, [employer], Doctor Visits, Communication with, Level of Care  [facility] Care Provider & Representative with [facility]]  Labs Hgb A1c every 3 months, Kidney Function  Vaccines COVID-19, Flu, Pneumonia, RSV, Shingles  [Encouraged]  Doctor Visits Discussed/Reviewed Doctor Visits Discussed, Doctor Visits Reviewed, Annual Wellness Visits, PCP, Specialist  Health Screening Colonoscopy, Prostate  Durable Medical Equipment (DME) BP Cuff, Glucomoter, Insulin Pump, Oxygen, Walker, Psychologist, forensic  PCP/Specialist Visits Compliance with follow-up visit  Communication with PCP/Specialists  Level of Care Applications, Assisted Living, Skilled Nursing Facility, Teaching laboratory technician Personal Care Services  Exercise Interventions   Exercise Discussed/Reviewed Exercise  Discussed, Exercise Reviewed, Physical Activity, Weight Managment, Assistive device use and maintanence  Physical Activity Discussed/Reviewed Physical Activity Discussed, Physical Activity Reviewed, Types of exercise  [Encouraged]  Weight Management Weight maintenance  Education Interventions   Education Provided Provided Therapist, sports, Provided Education  Provided Verbal Education On Nutrition, Foot Care, Eye Care, Labs, Blood Sugar Monitoring, Mental Health/Coping with Illness, Applications, Exercise, Medication, When to see the doctor, [facility], Secondary school teacher  Mental Health Interventions   Mental Health Discussed/Reviewed Mental Health Discussed, Mental Health Reviewed, Coping Strategies, Crisis, Anxiety, Depression, Suicide  Nutrition Interventions   Nutrition Discussed/Reviewed Nutrition Discussed, Nutrition Reviewed, Adding fruits and vegetables, Fluid intake, Portion sizes, Decreasing sugar intake, Decreasing salt, Decreasing fats, Increaing proteins  Pharmacy Interventions   Pharmacy Dicussed/Reviewed Pharmacy Topics Discussed, Pharmacy Topics Reviewed, Medication Adherence, Affording Medications  Safety Interventions   Safety Discussed/Reviewed Safety Discussed, Safety Reviewed, Fall Risk, Home Safety  Home Engineer, water, Refer for community resources, Contact home health agency  Advanced Directive Interventions   Advanced Directives Discussed/Reviewed Advanced Directives Discussed, Advanced Directives Reviewed      Active listening & reflection utilized.  Verbalization of feelings encouraged. Emotional & caregiver support provided. Feelings of frustration validated. Caregiver stress & burnout acknowledged. Problem solving interventions indicated. Task-centered strategies activated Solution-focused activities employed. Cognitive Behavioral Therapy initiated. Acceptance & Commitment Therapy performed. CSW  collaboration with significant other, [name] to confirm patient's attendance at follow-up appointment with Dr. Lewayne Bunting, Cardiologist with [name] [phone]), scheduled on 12/27/2022 at 1:45 pm. CSW collaboration with significant other, [name] to encourage attendance with patient at follow-up appointment with Dr. Lenox Ahr, Medical Oncologist with [facility], scheduled on 01/04/2023 at 12:30 pm. CSW  collaboration with significant other, [name] to confirm knowledge of change in cardiac medication, decreasing dosage of Amiodarone to 200 mg, po, daily, as prescribed by Dr. Lewayne Bunting, Cardiologist with [facility] [phone]). CSW collaboration with significant other, [name] to confirm patient's continued residency at [facility] (# [phone]), to receive rehabilitative services.   CSW collaboration with significant other, [name] to confirm patient's desire to return home to live independently, upon medical clearance & discharge from [facility] (# [phone]). CSW collaboration with significant other, [name] to assist with re-applying for Personal Care Services & submission of Personal Care Services application to [facility] [phone]), for processing. CSW collaboration with representative from [facility] [phone]), to confirm receipt of Personal Care Services application for processing & scheduling of initial home assessment for mid August. CSW collaboration with significant other, [name] to encourage direct contact with CSW (# [phone]), if she has questions, needs assistance, or if additional social work needs are identified between now & our next scheduled follow-up outreach call.      [name] assessments and interventions completed:  Yes.  Care Coordination Interventions:  Yes, provided.    Follow up plan: Follow up call scheduled for 01/11/2023 at 11:15 am.   Encounter Outcome:  Pt. Visit Completed.   Danford Bad, BSW, MSW, LCSW  Licensed Restaurant manager, fast food [facility]  Mailing [place] [address], [place], [place] [address] Physical Address-[address] [place], [place] [address] Toll Free Main # [phone] Fax # [phone] Cell # [phone] [email or web address]

## 2022-12-27 NOTE — Patient Instructions (Signed)
Visit Information  Thank you for taking time to visit with me today. Please don't hesitate to contact me if I can be of assistance to you.   Following are the goals we discussed today:   Goals Addressed               This Visit's Progress     Receive Assistance Applying for Personal Care Services. (pt-stated)   On track     Care Coordination Interventions:  Interventions Today    Flowsheet Row Most Recent Value  Chronic Disease   Chronic disease during today's visit Diabetes, Hypertension (HTN), Congestive Heart Failure (CHF), Chronic Kidney Disease/End Stage Renal Disease (ESRD), Other  [Major Depression, Single Episde, Severe]  General Interventions   General Interventions Discussed/Reviewed General Interventions Discussed, General Interventions Reviewed, Annual Eye Exam, Labs, Annual Foot Exam, Lipid Profile, Durable Medical Equipment (DME), Vaccines, Health Screening, [employer], Doctor Visits, Communication with, Level of Care  [facility] Care Provider & Representative with [facility]]  Labs Hgb A1c every 3 months, Kidney Function  Vaccines COVID-19, Flu, Pneumonia, RSV, Shingles  [Encouraged]  Doctor Visits Discussed/Reviewed Doctor Visits Discussed, Doctor Visits Reviewed, Annual Wellness Visits, PCP, Specialist  Health Screening Colonoscopy, Prostate  Durable Medical Equipment (DME) BP Cuff, Glucomoter, Insulin Pump, Oxygen, Walker, Psychologist, forensic  PCP/Specialist Visits Compliance with follow-up visit  Communication with PCP/Specialists  Level of Care Applications, Assisted Living, Skilled Nursing Facility, Teaching laboratory technician Personal Care Services  Exercise Interventions   Exercise Discussed/Reviewed Exercise Discussed, Exercise Reviewed, Physical Activity, Weight Managment, Assistive device use and maintanence  Physical Activity Discussed/Reviewed Physical Activity Discussed, Physical Activity Reviewed, Types of exercise   [Encouraged]  Weight Management Weight maintenance  Education Interventions   Education Provided Provided Therapist, sports, Provided Education  Provided Verbal Education On Nutrition, Foot Care, Eye Care, Labs, Blood Sugar Monitoring, Mental Health/Coping with Illness, Applications, Exercise, Medication, When to see the doctor, [employer], Secondary school teacher  Mental Health Interventions   Mental Health Discussed/Reviewed Mental Health Discussed, Mental Health Reviewed, Coping Strategies, Crisis, Anxiety, Depression, Suicide  Nutrition Interventions   Nutrition Discussed/Reviewed Nutrition Discussed, Nutrition Reviewed, Adding fruits and vegetables, Fluid intake, Portion sizes, Decreasing sugar intake, Decreasing salt, Decreasing fats, Increaing proteins  Pharmacy Interventions   Pharmacy Dicussed/Reviewed Pharmacy Topics Discussed, Pharmacy Topics Reviewed, Medication Adherence, Affording Medications  Safety Interventions   Safety Discussed/Reviewed Safety Discussed, Safety Reviewed, Fall Risk, Home Safety  Home Engineer, water, Refer for community resources, Contact home health agency  Advanced Directive Interventions   Advanced Directives Discussed/Reviewed Advanced Directives Discussed, Advanced Directives Reviewed      Active listening & reflection utilized.  Verbalization of feelings encouraged. Emotional & caregiver support provided. Feelings of frustration validated. Caregiver stress & burnout acknowledged. Problem solving interventions indicated. Task-centered strategies activated Solution-focused activities employed. Cognitive Behavioral Therapy initiated. Acceptance & Commitment Therapy performed. CSW collaboration with significant other, [name] to confirm patient's attendance at follow-up appointment with Dr. Lewayne Bunting, Cardiologist with [facility] at [facility] [phone]), scheduled on 12/27/2022  at 1:45 pm. CSW collaboration with significant other, [name] to encourage attendance with patient at follow-up appointment with Dr. Lenox Ahr, Medical Oncologist with [facility], scheduled on 01/04/2023 at 12:30 pm. CSW collaboration with significant other, [name] to confirm knowledge of change in cardiac medication, decreasing dosage of Amiodarone to 200 mg, po, daily, as prescribed by Dr. Lewayne Bunting, Cardiologist with [facility]  [facility] at [facility] [phone]). CSW collaboration with significant other, [name] to confirm patient's continued residency at [facility] (# [phone]), to receive rehabilitative services.   CSW collaboration with significant other, [name] to confirm patient's desire to return home to live independently, upon medical clearance & discharge from [facility] (# [phone]). CSW collaboration with significant other, [name] to assist with re-applying for Personal Care Services & submission of Personal Care Services application to [facility] [phone]), for processing. CSW collaboration with representative from  [facility] [phone]), to confirm receipt of Personal Care Services application for processing & scheduling of initial home assessment for mid August. CSW collaboration with significant other, [name] to encourage direct contact with CSW (# [phone]), if she has questions, needs assistance, or if additional social work needs are identified between now & our next scheduled follow-up outreach call.      Our next appointment is by telephone on 01/11/2023 at 11:15 am.   Please call the care guide team at [phone] if you need to cancel or reschedule your appointment.   If you are experiencing a Mental Health or Behavioral Health Crisis or need someone to talk to, please call the Suicide and Crisis Lifeline: 988 call the [place]  National Suicide Prevention Lifeline: 228-635-8141 or TTY: 416-351-1187 TTY 612-112-9498) to talk to a trained counselor call 1-800-273-TALK (toll free, 24 hour hotline) go to [facility] [address], [place] [phone]) call the [facility] Crisis Line: (708)641-5116 call 911  Patient verbalizes understanding of instructions and care plan provided today and agrees to view in MyChart. Active MyChart status and patient understanding of how to access instructions and care plan via MyChart confirmed with patient.     Telephone follow up appointment with care management team member scheduled for:  01/11/2023 at 11:15 am.   Danford Bad, BSW, MSW, LCSW  Licensed Clinical Social Worker  [facility]  Mailing [place]. [address], [place], [place] [address] Physical Address[address] [place], [place] [address] Toll Free Main # [phone] Fax # [phone] Cell # [phone] [email or web address]

## 2022-12-29 ENCOUNTER — Encounter: Payer: Self-pay | Admitting: Internal Medicine

## 2022-12-30 ENCOUNTER — Encounter: Payer: Self-pay | Admitting: Family Medicine

## 2023-01-07 ENCOUNTER — Telehealth: Payer: Self-pay | Admitting: Family Medicine

## 2023-01-07 NOTE — Telephone Encounter (Signed)
Front I filled out the certification of disability 1.  Please scan a copy into the system 2.  Please mail it as requested within the envelope provided 3.  Please send the patient a MyChart message letting him know that this was completed  Thank you

## 2023-01-10 ENCOUNTER — Ambulatory Visit (HOSPITAL_BASED_OUTPATIENT_CLINIC_OR_DEPARTMENT_OTHER): Payer: 59 | Admitting: Family

## 2023-01-11 ENCOUNTER — Encounter: Payer: Self-pay | Admitting: *Deleted

## 2023-01-11 ENCOUNTER — Ambulatory Visit: Payer: Self-pay | Admitting: *Deleted

## 2023-01-11 NOTE — Patient Outreach (Signed)
Care Coordination   Follow Up Visit Note   01/11/2023  Name: [name] MRN: [id number] DOB: Mar 20, 1956  [name] is a 67 y.o. year old male who sees [name] for primary care. I spoke with patient's significant other, [name] by phone today.  What matters to the patients health and wellness today?  Receive Assistance Applying for Personal Care Services.   Goals Addressed               This Visit's Progress     Receive Assistance Applying for Personal Care Services. (pt-stated)   On track     Care Coordination Interventions:  Interventions Today    Flowsheet Row Most Recent Value  Chronic Disease   Chronic disease during today's visit Diabetes, Hypertension (HTN), Congestive Heart Failure (CHF), Chronic Kidney Disease/End Stage Renal Disease (ESRD), Other  [Major Depression, Single Episde, Severe]  General Interventions   General Interventions Discussed/Reviewed General Interventions Discussed, General Interventions Reviewed, Annual Eye Exam, Labs, Annual Foot Exam, Lipid Profile, Durable Medical Equipment (DME), Vaccines, Health Screening, [employer], Doctor Visits, Communication with, Level of Care  [facility] Care Provider & Representative with [facility]]  Labs Hgb A1c every 3 months, Kidney Function  Vaccines COVID-19, Flu, Pneumonia, RSV, Shingles  [Encouraged]  Doctor Visits Discussed/Reviewed Doctor Visits Discussed, Doctor Visits Reviewed, Annual Wellness Visits, PCP, Specialist  Health Screening Colonoscopy, Prostate  Durable Medical Equipment (DME) BP Cuff, Glucomoter, Insulin Pump, Oxygen, Walker, Psychologist, forensic  PCP/Specialist Visits Compliance with follow-up visit  Communication with PCP/Specialists  Level of Care Applications, Assisted Living, Skilled Nursing Facility, Teaching laboratory technician Personal Care Services  Exercise Interventions   Exercise Discussed/Reviewed Exercise  Discussed, Exercise Reviewed, Physical Activity, Weight Managment, Assistive device use and maintanence  Physical Activity Discussed/Reviewed Physical Activity Discussed, Physical Activity Reviewed, Types of exercise  [Encouraged]  Weight Management Weight maintenance  Education Interventions   Education Provided Provided Therapist, sports, Provided Education  Provided Verbal Education On Nutrition, Foot Care, Eye Care, Labs, Blood Sugar Monitoring, Mental Health/Coping with Illness, Applications, Exercise, Medication, When to see the doctor, [employer], Secondary school teacher  Mental Health Interventions   Mental Health Discussed/Reviewed Mental Health Discussed, Mental Health Reviewed, Coping Strategies, Crisis, Anxiety, Depression, Suicide  Nutrition Interventions   Nutrition Discussed/Reviewed Nutrition Discussed, Nutrition Reviewed, Adding fruits and vegetables, Fluid intake, Portion sizes, Decreasing sugar intake, Decreasing salt, Decreasing fats, Increaing proteins  Pharmacy Interventions   Pharmacy Dicussed/Reviewed Pharmacy Topics Discussed, Pharmacy Topics Reviewed, Medication Adherence, Affording Medications  Safety Interventions   Safety Discussed/Reviewed Safety Discussed, Safety Reviewed, Fall Risk, Home Safety  Home Engineer, water, Refer for community resources, Contact home health agency  Advanced Directive Interventions   Advanced Directives Discussed/Reviewed Advanced Directives Discussed, Advanced Directives Reviewed      Active listening & reflection utilized.  Verbalization of feelings encouraged. Emotional & caregiver support provided. Problem solving interventions indicated. Task-centered strategies activated Solution-focused activities employed. Cognitive Behavioral Therapy initiated. Acceptance & Commitment Therapy performed. CSW collaboration with significant other, [name] to confirm patient's continued  residency at [facility] (# [phone]), to receive short-term rehabilitative services.   CSW collaboration with significant other, [name] to confirm patient's inability to return home to live independently. CSW collaboration with admissions coordinator at [facility] [phone]), to confirm initiation of process for pursuing long-term care placement for patient, upon completion of short-term rehabilitative services & discharge from facility.  CSW collaboration with significant other, [name] to encourage contact with CSW (# [phone]), if she has questions, needs assistance, or if additional social work needs are identified between now & our next scheduled follow-up outreach call.      [name] assessments and interventions completed:  Yes.  Care Coordination Interventions:  Yes, provided.   Follow up plan: Follow up call scheduled for 02/08/2023 at 3:00 pm.  Encounter Outcome:  Pt. Visit Completed.   Danford Bad, BSW, MSW, LCSW  Licensed Restaurant manager, [employer] [facility]  Mailing [place] [address] [place], [place] [address] Physical Address[address] [place], [place] [address] Toll Free Main # [phone] Fax # [phone] Cell # [phone] [email or web address]

## 2023-01-11 NOTE — Patient Instructions (Signed)
Visit Information  Thank you for taking time to visit with me today. Please don't hesitate to contact me if I can be of assistance to you.   Following are the goals we discussed today:   Goals Addressed               This Visit's Progress     Receive Assistance Applying for Personal Care Services. (pt-stated)   On track     Care Coordination Interventions:  Interventions Today    Flowsheet Row Most Recent Value  Chronic Disease   Chronic disease during today's visit Diabetes, Hypertension (HTN), Congestive Heart Failure (CHF), Chronic Kidney Disease/End Stage Renal Disease (ESRD), Other  [Major Depression, Single Episde, Severe]  General Interventions   General Interventions Discussed/Reviewed General Interventions Discussed, General Interventions Reviewed, Annual Eye Exam, Labs, Annual Foot Exam, Lipid Profile, Durable Medical Equipment (DME), Vaccines, Health Screening, [employer], Doctor Visits, Communication with, Level of Care  [facility] Care Provider & Representative with [facility]]  Labs Hgb A1c every 3 months, Kidney Function  Vaccines COVID-19, Flu, Pneumonia, RSV, Shingles  [Encouraged]  Doctor Visits Discussed/Reviewed Doctor Visits Discussed, Doctor Visits Reviewed, Annual Wellness Visits, PCP, Specialist  Health Screening Colonoscopy, Prostate  Durable Medical Equipment (DME) BP Cuff, Glucomoter, Insulin Pump, Oxygen, Walker, Psychologist, forensic  PCP/Specialist Visits Compliance with follow-up visit  Communication with PCP/Specialists  Level of Care Applications, Assisted Living, Skilled Nursing Facility, Teaching laboratory technician Personal Care Services  Exercise Interventions   Exercise Discussed/Reviewed Exercise Discussed, Exercise Reviewed, Physical Activity, Weight Managment, Assistive device use and maintanence  Physical Activity Discussed/Reviewed Physical Activity Discussed, Physical Activity Reviewed, Types of exercise   [Encouraged]  Weight Management Weight maintenance  Education Interventions   Education Provided Provided Therapist, sports, Provided Education  Provided Verbal Education On Nutrition, Foot Care, Eye Care, Labs, Blood Sugar Monitoring, Mental Health/Coping with Illness, Applications, Exercise, Medication, When to see the doctor, [employer], Secondary school teacher  Mental Health Interventions   Mental Health Discussed/Reviewed Mental Health Discussed, Mental Health Reviewed, Coping Strategies, Crisis, Anxiety, Depression, Suicide  Nutrition Interventions   Nutrition Discussed/Reviewed Nutrition Discussed, Nutrition Reviewed, Adding fruits and vegetables, Fluid intake, Portion sizes, Decreasing sugar intake, Decreasing salt, Decreasing fats, Increaing proteins  Pharmacy Interventions   Pharmacy Dicussed/Reviewed Pharmacy Topics Discussed, Pharmacy Topics Reviewed, Medication Adherence, Affording Medications  Safety Interventions   Safety Discussed/Reviewed Safety Discussed, Safety Reviewed, Fall Risk, Home Safety  Home Engineer, water, Refer for community resources, Contact home health agency  Advanced Directive Interventions   Advanced Directives Discussed/Reviewed Advanced Directives Discussed, Advanced Directives Reviewed      Active listening & reflection utilized.  Verbalization of feelings encouraged. Emotional & caregiver support provided. Problem solving interventions indicated. Task-centered strategies activated Solution-focused activities employed. Cognitive Behavioral Therapy initiated. Acceptance & Commitment Therapy performed. CSW collaboration with significant other, [name] to confirm patient's continued residency at [facility] (# [phone]), to receive short-term rehabilitative services.   CSW collaboration with significant other, [name] to confirm patient's inability to return home to live  independently. CSW collaboration with admissions coordinator at [facility] [phone]), to confirm initiation of process for pursuing long-term care placement for patient, upon completion of short-term rehabilitative services & discharge from facility. CSW collaboration with significant other, [name] to encourage contact with CSW (# [phone]), if she has questions, needs assistance, or if additional social work needs are identified between now & our next  scheduled follow-up outreach call.        Our next appointment is by telephone on 02/08/2023 at 3:00 pm.  Please call the care guide team at [phone] if you need to cancel or reschedule your appointment.   If you are experiencing a Mental Health or Behavioral Health Crisis or need someone to talk to, please call the Suicide and Crisis Lifeline: 988 call the [place] National Suicide Prevention Lifeline: (608)786-0577 or TTY: 332-884-5469 TTY (904)619-6011) to talk to a trained counselor call 1-800-273-TALK (toll free, 24 hour hotline) go to [facility] [address], [place] [phone]) call the [facility] Crisis Line: (929)023-4440 call 911  Patient verbalizes understanding of instructions and care plan provided today and agrees to view in MyChart. Active MyChart status and patient understanding of how to access instructions and care plan via MyChart confirmed with patient.     Telephone follow up appointment with care management team member scheduled for:  02/08/2023 at 3:00 pm.  Danford Bad, BSW, MSW, LCSW  Licensed Clinical Social Worker  [facility]  Mailing [place]. [address], [place], [place] [address] Physical Address[address] [place], [place] [address] Toll Free Main # [phone] Fax # [phone] Cell # [phone] [email or web address]

## 2023-01-25 ENCOUNTER — Telehealth: Payer: Self-pay

## 2023-01-25 NOTE — Telephone Encounter (Signed)
   Pre-operative Risk Assessment    Patient Name: [name]  DOB: Apr 22, 1956 MRN: [id number]     Request for Surgical Clearance    Procedure:  upper endoscopy   Date of Surgery:  Clearance TBD                                 Surgeon:  none listed  Surgeon's Group or Practice Name:  [facility]  Phone number:  [phone] Fax number:  [phone]   Type of Clearance Requested:   - Medical    Type of Anesthesia:  Not Indicated   Additional requests/questions:    Scarlette Shorts   01/25/2023, 4:51 PM

## 2023-01-26 NOTE — Telephone Encounter (Signed)
     Primary Cardiologist: Chilton Si, MD  Chart reviewed as part of pre-operative protocol coverage. Given past medical history and time since last visit, based on ACC/AHA guidelines, [name] would be at acceptable risk for the planned procedure without further cardiovascular testing.    I will route this recommendation to the requesting party via Epic fax function and remove from pre-op pool.  Please call with questions.  Thomasene Ripple.  NP-C     01/26/2023, 8:36 AM [facility] [address] Office [phone] Fax [phone]

## 2023-02-08 ENCOUNTER — Ambulatory Visit: Payer: Self-pay | Admitting: *Deleted

## 2023-02-08 ENCOUNTER — Encounter: Payer: Self-pay | Admitting: *Deleted

## 2023-02-08 NOTE — Patient Outreach (Signed)
Care Coordination   Follow Up Visit Note   02/08/2023  Name: [name] MRN: [id number] DOB: [birth year]  [name] is a 67 y.o. year old male who sees [name], Jonna Coup, MD for primary care. I spoke with patient's significant other, [name] by phone today.  What matters to the patients health and wellness today?  Receive Assistance Applying for Personal Care Services.   Goals Addressed               This Visit's Progress     Receive Assistance Applying for Personal Care Services. (pt-stated)   On track     Care Coordination Interventions:  Interventions Today    Flowsheet Row Most Recent Value  Chronic Disease   Chronic disease during today's visit Diabetes, Hypertension (HTN), Congestive Heart Failure (CHF), Chronic Kidney Disease/End Stage Renal Disease (ESRD), Other  [Major Depression, Single Episde, Severe]  General Interventions   General Interventions Discussed/Reviewed General Interventions Discussed, General Interventions Reviewed, Annual Eye Exam, Labs, Annual Foot Exam, Lipid Profile, Durable Medical Equipment (DME), Vaccines, Health Screening, [facility], Doctor Visits, Communication with, Level of Care  [facility] Care Provider & Representative with [facility]]  Labs Hgb A1c every 3 months, Kidney Function  Vaccines COVID-19, Flu, Pneumonia, RSV, Shingles  [Encouraged]  Doctor Visits Discussed/Reviewed Doctor Visits Discussed, Doctor Visits Reviewed, Annual Wellness Visits, PCP, Specialist  Health Screening Colonoscopy, Prostate  Durable Medical Equipment (DME) BP Cuff, Glucomoter, Insulin Pump, Oxygen, Walker, Psychologist, forensic  PCP/Specialist Visits Compliance with follow-up visit  Communication with PCP/Specialists  Level of Care Applications, Assisted Living, Skilled Nursing Facility, Teaching laboratory technician Personal Care Services  Exercise Interventions   Exercise Discussed/Reviewed Exercise  Discussed, Exercise Reviewed, Physical Activity, Weight Managment, Assistive device use and maintanence  Physical Activity Discussed/Reviewed Physical Activity Discussed, Physical Activity Reviewed, Types of exercise  [Encouraged]  Weight Management Weight maintenance  Education Interventions   Education Provided Provided Therapist, sports, Provided Education  Provided Verbal Education On Nutrition, Foot Care, Eye Care, Labs, Blood Sugar Monitoring, Mental Health/Coping with Illness, Applications, Exercise, Medication, When to see the doctor, [facility], Secondary school teacher  Mental Health Interventions   Mental Health Discussed/Reviewed Mental Health Discussed, Mental Health Reviewed, Coping Strategies, Crisis, Anxiety, Depression, Suicide  Nutrition Interventions   Nutrition Discussed/Reviewed Nutrition Discussed, Nutrition Reviewed, Adding fruits and vegetables, Fluid intake, Portion sizes, Decreasing sugar intake, Decreasing salt, Decreasing fats, Increaing proteins  Pharmacy Interventions   Pharmacy Dicussed/Reviewed Pharmacy Topics Discussed, Pharmacy Topics Reviewed, Medication Adherence, Affording Medications  Safety Interventions   Safety Discussed/Reviewed Safety Discussed, Safety Reviewed, Fall Risk, Home Safety  Home Engineer, water, Refer for community resources, Contact home health agency  Advanced Directive Interventions   Advanced Directives Discussed/Reviewed Advanced Directives Discussed, Advanced Directives Reviewed      Active listening & reflection utilized.  Verbalization of feelings encouraged. Emotional & caregiver support provided. Problem solving interventions indicated. Task-centered strategies activated Solution-focused activities employed. Cognitive Behavioral Therapy initiated. Acceptance & Commitment Therapy performed. CSW collaboration with significant other, [name] to confirm patient's continued  residency at [facility] (# [phone]).   CSW collaboration with admissions coordinator at [facility] [phone]), to confirm initiation of process for pursuing long-term care placement for patient, upon completion of short-term rehabilitative services & discharge from facility. CSW collaboration with significant other, [name] to encourage attendance with patient at Endoscopy Procedure with Dr. Alvia Grove, Gastroenterologist with [facility] (# [phone]), scheduled on 03/23/2023 at 9:20 am. CSW collaboration with significant other, [name] to encourage contact with CSW (# [phone]), if she has questions, needs assistance, or if additional social work needs are identified between now & our next scheduled follow-up outreach call.      [name] assessments and interventions completed:  Yes.  Care Coordination Interventions:  Yes, provided.   Follow up plan: Follow up call scheduled for 02/23/2023 at 10:00 am.  Encounter Outcome:  Pt. Visit Completed.   Danford Bad, BSW, MSW, Printmaker Social Work Case Set designer Health  [facility], Population Health Direct Dial: [phone]  Fax: [phone] Email: [email or web address]: [email or web address]

## 2023-02-08 NOTE — Patient Instructions (Signed)
Visit Information  Thank you for taking time to visit with me today. Please don't hesitate to contact me if I can be of assistance to you.   Following are the goals we discussed today:   Goals Addressed               This Visit's Progress     Receive Assistance Applying for Personal Care Services. (pt-stated)   On track     Care Coordination Interventions:  Interventions Today    Flowsheet Row Most Recent Value  Chronic Disease   Chronic disease during today's visit Diabetes, Hypertension (HTN), Congestive Heart Failure (CHF), Chronic Kidney Disease/End Stage Renal Disease (ESRD), Other  [Major Depression, Single Episde, Severe]  General Interventions   General Interventions Discussed/Reviewed General Interventions Discussed, General Interventions Reviewed, Annual Eye Exam, Labs, Annual Foot Exam, Lipid Profile, Durable Medical Equipment (DME), Vaccines, Health Screening, [facility], Doctor Visits, Communication with, Level of Care  [facility] Care Provider & Representative with [facility]]  Labs Hgb A1c every 3 months, Kidney Function  Vaccines COVID-19, Flu, Pneumonia, RSV, Shingles  [Encouraged]  Doctor Visits Discussed/Reviewed Doctor Visits Discussed, Doctor Visits Reviewed, Annual Wellness Visits, PCP, Specialist  Health Screening Colonoscopy, Prostate  Durable Medical Equipment (DME) BP Cuff, Glucomoter, Insulin Pump, Oxygen, Walker, Psychologist, forensic  PCP/Specialist Visits Compliance with follow-up visit  Communication with PCP/Specialists  Level of Care Applications, Assisted Living, Skilled Nursing Facility, Teaching laboratory technician Personal Care Services  Exercise Interventions   Exercise Discussed/Reviewed Exercise Discussed, Exercise Reviewed, Physical Activity, Weight Managment, Assistive device use and maintanence  Physical Activity Discussed/Reviewed Physical Activity Discussed, Physical Activity Reviewed, Types of exercise   [Encouraged]  Weight Management Weight maintenance  Education Interventions   Education Provided Provided Therapist, sports, Provided Education  Provided Verbal Education On Nutrition, Foot Care, Eye Care, Labs, Blood Sugar Monitoring, Mental Health/Coping with Illness, Applications, Exercise, Medication, When to see the doctor, [facility], Secondary school teacher  Mental Health Interventions   Mental Health Discussed/Reviewed Mental Health Discussed, Mental Health Reviewed, Coping Strategies, Crisis, Anxiety, Depression, Suicide  Nutrition Interventions   Nutrition Discussed/Reviewed Nutrition Discussed, Nutrition Reviewed, Adding fruits and vegetables, Fluid intake, Portion sizes, Decreasing sugar intake, Decreasing salt, Decreasing fats, Increaing proteins  Pharmacy Interventions   Pharmacy Dicussed/Reviewed Pharmacy Topics Discussed, Pharmacy Topics Reviewed, Medication Adherence, Affording Medications  Safety Interventions   Safety Discussed/Reviewed Safety Discussed, Safety Reviewed, Fall Risk, Home Safety  Home Engineer, water, Refer for community resources, Contact home health agency  Advanced Directive Interventions   Advanced Directives Discussed/Reviewed Advanced Directives Discussed, Advanced Directives Reviewed      Active listening & reflection utilized.  Verbalization of feelings encouraged. Emotional & caregiver support provided. Problem solving interventions indicated. Task-centered strategies activated Solution-focused activities employed. Cognitive Behavioral Therapy initiated. Acceptance & Commitment Therapy performed. CSW collaboration with significant other, [name] to confirm patient's continued residency at [facility] (# [phone]).   CSW collaboration with admissions coordinator at [facility] [phone]), to confirm initiation of process for pursuing long-term care  placement for patient, upon completion of short-term rehabilitative services & discharge from facility. CSW collaboration with significant other, [name] to encourage attendance with patient at Endoscopy Procedure with Dr. Alvia Grove, Gastroenterologist with [facility] [phone]), scheduled on 03/23/2023 at 9:20 am. CSW collaboration with significant other, [name] to encourage contact with CSW (# [phone]), if she has  questions, needs assistance, or if additional social work needs are identified between now & our next scheduled follow-up outreach call.      Our next appointment is by telephone on 02/23/2023 at 10:00 am.  Please call the care guide team at [phone] if you need to cancel or reschedule your appointment.   If you are experiencing a Mental Health or Behavioral Health Crisis or need someone to talk to, please call the Suicide and Crisis Lifeline: 988 call the [place] National Suicide Prevention Lifeline: 828 762 1935 or TTY: 719-837-8020 TTY (865) 282-1248) to talk to a trained counselor call 1-800-273-TALK (toll free, 24 hour hotline) go to [facility] [address] [place] [phone]) call the [facility] Crisis Line: [phone] call 911  Patient verbalizes understanding of instructions and care plan provided today and agrees to view in MyChart. Active MyChart status and patient understanding of how to access instructions and care plan via MyChart confirmed with patient.     Telephone follow up appointment with care management team member scheduled for:  02/23/2023 at 10:00 am.  Danford Bad, BSW, MSW, LCSW  Embedded Practice Social Work Case Manager  [facility], Population Health Direct Dial: [phone]  Fax: [phone] Email: [email or web address]: [email or web address]

## 2023-02-12 ENCOUNTER — Telehealth: Payer: Self-pay | Admitting: Family Medicine

## 2023-02-12 NOTE — Telephone Encounter (Signed)
Nurses Please read the following then Please reach out to the patient.  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  If so where?  #2 does he feel that he is capable of doing a follow-up endoscopy with [facility]-is he open to doing a follow-up with [facility] if he is physically capable of doing so?  #3-where he is currently is he under the care of medical providers?  Or will he need a follow-up visit with [place]  #4-to his knowledge will he be going home at any point in time  All of this information would be useful for [place] to be able to coordinate with [facility]  Please let him know that we said hello  ([facility] Alyse Low RN GI support services [facility] phone #[phone], fax number [phone])

## 2023-02-13 ENCOUNTER — Other Ambulatory Visit: Payer: Self-pay

## 2023-02-13 ENCOUNTER — Observation Stay (HOSPITAL_COMMUNITY)
Admission: EM | Admit: 2023-02-13 | Discharge: 2023-02-16 | Disposition: A | Discharge status: Skilled Nursing Facility | Payer: Medicare Other | Attending: Internal Medicine | Admitting: Internal Medicine

## 2023-02-13 ENCOUNTER — Encounter (HOSPITAL_COMMUNITY): Payer: Self-pay | Admitting: Internal Medicine

## 2023-02-13 ENCOUNTER — Emergency Department (HOSPITAL_COMMUNITY): Payer: Medicare Other

## 2023-02-13 DIAGNOSIS — I132 Hypertensive heart and chronic kidney disease with heart failure and with stage 5 chronic kidney disease, or end stage renal disease: Secondary | ICD-10-CM | POA: Diagnosis not present

## 2023-02-13 DIAGNOSIS — J810 Acute pulmonary edema: Secondary | ICD-10-CM | POA: Insufficient documentation

## 2023-02-13 DIAGNOSIS — I25118 Atherosclerotic heart disease of native coronary artery with other forms of angina pectoris: Secondary | ICD-10-CM | POA: Insufficient documentation

## 2023-02-13 DIAGNOSIS — E1122 Type 2 diabetes mellitus with diabetic chronic kidney disease: Secondary | ICD-10-CM | POA: Diagnosis not present

## 2023-02-13 DIAGNOSIS — I5021 Acute systolic (congestive) heart failure: Secondary | ICD-10-CM

## 2023-02-13 DIAGNOSIS — I25708 Atherosclerosis of coronary artery bypass graft(s), unspecified, with other forms of angina pectoris: Secondary | ICD-10-CM | POA: Diagnosis present

## 2023-02-13 DIAGNOSIS — R1319 Other dysphagia: Secondary | ICD-10-CM

## 2023-02-13 DIAGNOSIS — N186 End stage renal disease: Secondary | ICD-10-CM | POA: Diagnosis not present

## 2023-02-13 DIAGNOSIS — D631 Anemia in chronic kidney disease: Secondary | ICD-10-CM | POA: Diagnosis not present

## 2023-02-13 DIAGNOSIS — E1129 Type 2 diabetes mellitus with other diabetic kidney complication: Secondary | ICD-10-CM

## 2023-02-13 DIAGNOSIS — G4733 Obstructive sleep apnea (adult) (pediatric): Secondary | ICD-10-CM | POA: Diagnosis present

## 2023-02-13 DIAGNOSIS — R2689 Other abnormalities of gait and mobility: Secondary | ICD-10-CM | POA: Insufficient documentation

## 2023-02-13 DIAGNOSIS — R531 Weakness: Secondary | ICD-10-CM | POA: Diagnosis present

## 2023-02-13 DIAGNOSIS — E8770 Fluid overload, unspecified: Secondary | ICD-10-CM | POA: Diagnosis not present

## 2023-02-13 DIAGNOSIS — Z8501 Personal history of malignant neoplasm of esophagus: Secondary | ICD-10-CM | POA: Diagnosis not present

## 2023-02-13 DIAGNOSIS — Z8673 Personal history of transient ischemic attack (TIA), and cerebral infarction without residual deficits: Secondary | ICD-10-CM | POA: Diagnosis not present

## 2023-02-13 DIAGNOSIS — R2681 Unsteadiness on feet: Secondary | ICD-10-CM | POA: Diagnosis not present

## 2023-02-13 DIAGNOSIS — E785 Hyperlipidemia, unspecified: Secondary | ICD-10-CM | POA: Diagnosis not present

## 2023-02-13 DIAGNOSIS — G9341 Metabolic encephalopathy: Secondary | ICD-10-CM | POA: Diagnosis not present

## 2023-02-13 DIAGNOSIS — Z992 Dependence on renal dialysis: Secondary | ICD-10-CM

## 2023-02-13 DIAGNOSIS — I10 Essential (primary) hypertension: Secondary | ICD-10-CM | POA: Diagnosis present

## 2023-02-13 DIAGNOSIS — Z7982 Long term (current) use of aspirin: Secondary | ICD-10-CM | POA: Insufficient documentation

## 2023-02-13 DIAGNOSIS — Z79899 Other long term (current) drug therapy: Secondary | ICD-10-CM | POA: Insufficient documentation

## 2023-02-13 DIAGNOSIS — Z86711 Personal history of pulmonary embolism: Secondary | ICD-10-CM | POA: Insufficient documentation

## 2023-02-13 DIAGNOSIS — I471 Supraventricular tachycardia, unspecified: Secondary | ICD-10-CM | POA: Diagnosis present

## 2023-02-13 DIAGNOSIS — K219 Gastro-esophageal reflux disease without esophagitis: Secondary | ICD-10-CM | POA: Diagnosis present

## 2023-02-13 DIAGNOSIS — I5043 Acute on chronic combined systolic (congestive) and diastolic (congestive) heart failure: Secondary | ICD-10-CM | POA: Insufficient documentation

## 2023-02-13 DIAGNOSIS — F32A Depression, unspecified: Secondary | ICD-10-CM | POA: Diagnosis present

## 2023-02-13 DIAGNOSIS — I5042 Chronic combined systolic (congestive) and diastolic (congestive) heart failure: Secondary | ICD-10-CM | POA: Diagnosis not present

## 2023-02-13 DIAGNOSIS — E1169 Type 2 diabetes mellitus with other specified complication: Secondary | ICD-10-CM | POA: Diagnosis not present

## 2023-02-13 DIAGNOSIS — Z951 Presence of aortocoronary bypass graft: Secondary | ICD-10-CM | POA: Diagnosis not present

## 2023-02-13 DIAGNOSIS — G9340 Encephalopathy, unspecified: Secondary | ICD-10-CM | POA: Diagnosis not present

## 2023-02-13 DIAGNOSIS — C159 Malignant neoplasm of esophagus, unspecified: Secondary | ICD-10-CM | POA: Diagnosis present

## 2023-02-13 DIAGNOSIS — Z87891 Personal history of nicotine dependence: Secondary | ICD-10-CM | POA: Diagnosis not present

## 2023-02-13 DIAGNOSIS — U071 COVID-19: Secondary | ICD-10-CM | POA: Insufficient documentation

## 2023-02-13 DIAGNOSIS — M6281 Muscle weakness (generalized): Secondary | ICD-10-CM | POA: Diagnosis not present

## 2023-02-13 DIAGNOSIS — J189 Pneumonia, unspecified organism: Secondary | ICD-10-CM

## 2023-02-13 DIAGNOSIS — R1310 Dysphagia, unspecified: Secondary | ICD-10-CM

## 2023-02-13 DIAGNOSIS — N184 Chronic kidney disease, stage 4 (severe): Secondary | ICD-10-CM

## 2023-02-13 DIAGNOSIS — E119 Type 2 diabetes mellitus without complications: Secondary | ICD-10-CM

## 2023-02-13 DIAGNOSIS — I739 Peripheral vascular disease, unspecified: Secondary | ICD-10-CM | POA: Diagnosis present

## 2023-02-13 DIAGNOSIS — Z794 Long term (current) use of insulin: Secondary | ICD-10-CM | POA: Diagnosis not present

## 2023-02-13 DIAGNOSIS — R400 Somnolence: Principal | ICD-10-CM

## 2023-02-13 HISTORY — DX: Chronic systolic (congestive) heart failure: I50.22

## 2023-02-13 HISTORY — DX: Esophagitis, unspecified without bleeding: K20.90

## 2023-02-13 HISTORY — DX: End stage renal disease: N18.6

## 2023-02-13 LAB — URINALYSIS, W/ REFLEX TO CULTURE (INFECTION SUSPECTED)
Bilirubin Urine: NEGATIVE
Glucose, UA: NEGATIVE mg/dL
Hgb urine dipstick: NEGATIVE
Ketones, ur: NEGATIVE mg/dL
Leukocytes,Ua: NEGATIVE
Nitrite: NEGATIVE
Protein, ur: >=300 mg/dL — AB
Specific Gravity, Urine: 1.015 (ref 1.005–1.030)
pH: 7 (ref 5.0–8.0)

## 2023-02-13 LAB — COMPREHENSIVE METABOLIC PANEL
ALT: 6 U/L (ref 0–44)
AST: 9 U/L — ABNORMAL LOW (ref 15–41)
Albumin: 2.5 g/dL — ABNORMAL LOW (ref 3.5–5.0)
Alkaline Phosphatase: 61 U/L (ref 38–126)
Anion gap: 13 (ref 5–15)
BUN: 31 mg/dL — ABNORMAL HIGH (ref 8–23)
CO2: 28 mmol/L (ref 22–32)
Calcium: 8.3 mg/dL — ABNORMAL LOW (ref 8.9–10.3)
Chloride: 93 mmol/L — ABNORMAL LOW (ref 98–111)
Creatinine, Ser: 5.84 mg/dL — ABNORMAL HIGH (ref 0.61–1.24)
GFR, Estimated: 10 mL/min — ABNORMAL LOW (ref >60)
Glucose, Bld: 155 mg/dL — ABNORMAL HIGH (ref 70–99)
Potassium: 3.6 mmol/L (ref 3.5–5.1)
Sodium: 134 mmol/L — ABNORMAL LOW (ref 135–145)
Total Bilirubin: 1 mg/dL (ref 0.3–1.2)
Total Protein: 6 g/dL — ABNORMAL LOW (ref 6.5–8.1)

## 2023-02-13 LAB — RESP PANEL BY RT-PCR (RSV, FLU A&B, COVID)  RVPGX2
Influenza A by PCR: NEGATIVE
Influenza B by PCR: NEGATIVE
Resp Syncytial Virus by PCR: NEGATIVE
SARS Coronavirus 2 by RT PCR: POSITIVE — AB

## 2023-02-13 LAB — CBC WITH DIFFERENTIAL/PLATELET
Abs Immature Granulocytes: 0.04 10*3/uL (ref 0.00–0.07)
Basophils Absolute: 0 10*3/uL (ref 0.0–0.1)
Basophils Relative: 0 %
Eosinophils Absolute: 0 10*3/uL (ref 0.0–0.5)
Eosinophils Relative: 1 %
HCT: 32 % — ABNORMAL LOW (ref 39.0–52.0)
Hemoglobin: 9.3 g/dL — ABNORMAL LOW (ref 13.0–17.0)
Immature Granulocytes: 1 %
Lymphocytes Relative: 11 %
Lymphs Abs: 0.6 10*3/uL — ABNORMAL LOW (ref 0.7–4.0)
MCH: 23.7 pg — ABNORMAL LOW (ref 26.0–34.0)
MCHC: 29.1 g/dL — ABNORMAL LOW (ref 30.0–36.0)
MCV: 81.6 fL (ref 80.0–100.0)
Monocytes Absolute: 0.5 10*3/uL (ref 0.1–1.0)
Monocytes Relative: 9 %
Neutro Abs: 4.5 10*3/uL (ref 1.7–7.7)
Neutrophils Relative %: 78 %
Platelets: 203 10*3/uL (ref 150–400)
RBC: 3.92 MIL/uL — ABNORMAL LOW (ref 4.22–5.81)
RDW: 17.1 % — ABNORMAL HIGH (ref 11.5–15.5)
WBC: 5.7 10*3/uL (ref 4.0–10.5)
nRBC: 0 % (ref 0.0–0.2)

## 2023-02-13 LAB — HEPATITIS B SURFACE ANTIGEN: Hepatitis B Surface Ag: NONREACTIVE

## 2023-02-13 LAB — TSH: TSH: 4.229 u[IU]/mL (ref 0.350–4.500)

## 2023-02-13 LAB — AMMONIA: Ammonia: 17 umol/L (ref 9–35)

## 2023-02-13 LAB — BRAIN NATRIURETIC PEPTIDE: B Natriuretic Peptide: 3500.5 pg/mL — ABNORMAL HIGH (ref 0.0–100.0)

## 2023-02-13 LAB — LIPASE, BLOOD: Lipase: 31 U/L (ref 11–51)

## 2023-02-13 LAB — LACTIC ACID, PLASMA
Lactic Acid, Venous: 1 mmol/L (ref 0.5–1.9)
Lactic Acid, Venous: 1.2 mmol/L (ref 0.5–1.9)

## 2023-02-13 LAB — GLUCOSE, CAPILLARY: Glucose-Capillary: 140 mg/dL — ABNORMAL HIGH (ref 70–99)

## 2023-02-13 LAB — MAGNESIUM: Magnesium: 2.1 mg/dL (ref 1.7–2.4)

## 2023-02-13 LAB — PROCALCITONIN: Procalcitonin: 0.38 ng/mL

## 2023-02-13 MED ORDER — ACETAMINOPHEN 325 MG PO TABS
650.0000 mg | ORAL_TABLET | Freq: Four times a day (QID) | ORAL | Status: DC | PRN
  Administered 2023-02-16: 650 mg via ORAL
  Filled 2023-02-13: qty 2

## 2023-02-13 MED ORDER — LEVOTHYROXINE SODIUM 25 MCG PO TABS
25.0000 ug | ORAL_TABLET | Freq: Every day | ORAL | Status: DC
  Administered 2023-02-14 – 2023-02-16 (×3): 25 ug via ORAL
  Filled 2023-02-13 (×3): qty 1

## 2023-02-13 MED ORDER — FENTANYL 25 MCG/HR TD PT72
1.0000 | MEDICATED_PATCH | TRANSDERMAL | Status: DC
  Administered 2023-02-15: 1 via TRANSDERMAL
  Filled 2023-02-13: qty 1

## 2023-02-13 MED ORDER — SODIUM CHLORIDE 0.9% FLUSH
3.0000 mL | Freq: Two times a day (BID) | INTRAVENOUS | Status: DC
  Administered 2023-02-13 – 2023-02-16 (×6): 3 mL via INTRAVENOUS

## 2023-02-13 MED ORDER — AMIODARONE HCL 200 MG PO TABS
200.0000 mg | ORAL_TABLET | Freq: Every day | ORAL | Status: DC
  Administered 2023-02-14: 200 mg via ORAL
  Filled 2023-02-13: qty 1

## 2023-02-13 MED ORDER — SODIUM CHLORIDE 0.9 % IV SOLN
500.0000 mg | Freq: Once | INTRAVENOUS | Status: AC
  Administered 2023-02-13: 500 mg via INTRAVENOUS
  Filled 2023-02-13: qty 5

## 2023-02-13 MED ORDER — INSULIN ASPART 100 UNIT/ML IJ SOLN
0.0000 [IU] | Freq: Three times a day (TID) | INTRAMUSCULAR | Status: DC
  Administered 2023-02-14 – 2023-02-16 (×4): 1 [IU] via SUBCUTANEOUS

## 2023-02-13 MED ORDER — CHLORHEXIDINE GLUCONATE CLOTH 2 % EX PADS
6.0000 | MEDICATED_PAD | Freq: Every day | CUTANEOUS | Status: DC
  Administered 2023-02-14 – 2023-02-16 (×3): 6 via TOPICAL

## 2023-02-13 MED ORDER — ACETAMINOPHEN 650 MG RE SUPP: 650.0000 mg | Freq: Four times a day (QID) | RECTAL | Status: DC | PRN

## 2023-02-13 MED ORDER — PANTOPRAZOLE SODIUM 40 MG PO TBEC
40.0000 mg | DELAYED_RELEASE_TABLET | Freq: Two times a day (BID) | ORAL | Status: DC
  Administered 2023-02-14 – 2023-02-16 (×5): 40 mg via ORAL
  Filled 2023-02-13 (×5): qty 1

## 2023-02-13 MED ORDER — METOPROLOL TARTRATE 25 MG PO TABS
25.0000 mg | ORAL_TABLET | Freq: Two times a day (BID) | ORAL | Status: DC
  Administered 2023-02-14 – 2023-02-15 (×3): 25 mg via ORAL
  Filled 2023-02-13 (×3): qty 1

## 2023-02-13 MED ORDER — SODIUM CHLORIDE 0.9 % IV SOLN
1.0000 g | Freq: Once | INTRAVENOUS | Status: AC
  Administered 2023-02-13: 1 g via INTRAVENOUS
  Filled 2023-02-13: qty 10

## 2023-02-13 MED ORDER — HEPARIN SODIUM (PORCINE) 5000 UNIT/ML IJ SOLN
5000.0000 [IU] | Freq: Three times a day (TID) | INTRAMUSCULAR | Status: DC
  Administered 2023-02-13 – 2023-02-16 (×9): 5000 [IU] via SUBCUTANEOUS
  Filled 2023-02-13 (×9): qty 1

## 2023-02-13 MED ORDER — SERTRALINE HCL 100 MG PO TABS
100.0000 mg | ORAL_TABLET | Freq: Every day | ORAL | Status: DC
  Administered 2023-02-14 – 2023-02-16 (×3): 100 mg via ORAL
  Filled 2023-02-13 (×3): qty 1

## 2023-02-13 MED ORDER — POLYETHYLENE GLYCOL 3350 17 G PO PACK: 17.0000 g | PACK | Freq: Every day | ORAL | Status: DC | PRN

## 2023-02-13 NOTE — ED Notes (Signed)
In & out complete. Pt tolerated well.

## 2023-02-13 NOTE — ED Provider Notes (Signed)
[place] EMERGENCY DEPARTMENT AT [facility] Provider Note   CSN: [id number] Arrival date & time: 02/13/23  1116     History  Chief Complaint  Patient presents with   Weakness    [name] is a 67 y.o. male.  The history is provided by the patient, a relative and medical records. The history is limited by the condition of the patient. No language interpreter was used.  Weakness Severity:  Severe Onset quality:  Gradual Duration:  2 days Timing:  Constant Progression:  Worsening Relieved by:  Nothing Worsened by:  Nothing Ineffective treatments:  None tried Associated symptoms: cough, fever and shortness of breath   Associated symptoms: no abdominal pain, no chest pain, no diarrhea, no headaches, no loss of consciousness, no nausea, no near-syncope, no seizures and no vomiting        Home Medications Prior to Admission medications   Medication Sig Start Date End Date Taking? Authorizing Provider  acetaminophen (TYLENOL) 325 MG tablet Take 2 tablets (650 mg total) by mouth every 6 (six) hours as needed for mild pain, moderate pain or fever. Patient not taking: Reported on 12/27/2022 08/02/21   Drema Dallas, MD  albuterol (PROVENTIL) (2.5 MG/3ML) 0.083% nebulizer solution Inhale 3 mLs into the lungs every 6 (six) hours as needed for wheezing or shortness of breath. 11/29/22   Evette Georges, MD  amiodarone (PACERONE) 200 MG tablet Take 1 tablet (200 mg total) by mouth daily. 12/27/22   Marinus Maw, MD  aspirin EC 325 MG tablet 325 mg every 4 (four) hours as needed. 11/04/22   [provider]  bisacodyl (DULCOLAX) 10 MG suppository Place 1 suppository (10 mg total) rectally daily as needed for moderate constipation. 09/17/22   Jerald Kief, MD  calcium acetate (PHOSLO) 667 MG capsule Take 667 mg by mouth See admin instructions. Pt takes 1 capsule 3 times daily Tuesday,Thursday,Saturday,Sunday. All other days are twice daily    [provider]   doxazosin (CARDURA) 2 MG tablet TAKE 1 TABLET BY MOUTH DAILY 07/29/22   Lilyan Punt A, MD  EPINEPHrine 0.3 mg/0.3 mL IJ SOAJ injection Inject 0.3 mg into the muscle as needed for anaphylaxis. 01/25/21   Babs Sciara, MD  Evolocumab (REPATHA SURECLICK) 140 MG/ML SOAJ Inject 140 mg into the skin every 14 (fourteen) days. Patient not taking: Reported on 12/27/2022 06/29/22   Alver Sorrow, NP  fentaNYL (DURAGESIC) 25 MCG/HR Place 1 patch onto the skin every 3 (three) days.    [provider]  gabapentin (NEURONTIN) 100 MG capsule Take 100 mg by mouth at bedtime.    [provider]  HYDROmorphone (DILAUDID) 2 MG tablet Take 2 mg by mouth every 6 (six) hours as needed for severe pain.    [provider]  insulin glargine (LANTUS SOLOSTAR) 100 UNIT/ML Solostar Pen daily. 09/07/22   [provider]  insulin lispro (HUMALOG KWIKPEN) 100 UNIT/ML KwikPen Inject 0-15 Units into the skin 3 (three) times daily. 0-120 - 0 units, 121-150-2 units. 151-200-3 units.  201-250=5 units. 251-300=8 units 301-350=11 units 351-400=15 units. If CBG greater than 400 call MD    [provider]  methocarbamol (ROBAXIN) 500 MG tablet Take 1 tablet (500 mg total) by mouth every 8 (eight) hours as needed for muscle spasms. Patient taking differently: Take 500 mg by mouth daily. 09/17/22   Jerald Kief, MD  metoprolol tartrate (LOPRESSOR) 25 MG tablet Take 1 tablet (25 mg total) by mouth  2 (two) times daily. In addition you can take a 3rd dose of metoprolol as needed for heart rate > 120 Patient taking differently: Take 12.5 mg by mouth 2 (two) times daily. In addition you can take a 3rd dose of metoprolol as needed for heart rate > 120 09/22/22   Gwyneth Sprout, MD  nitroGLYCERIN (NITROSTAT) 0.4 MG SL tablet Place 1 tablet (0.4 mg total) under the tongue every 5 (five) minutes as needed for chest pain. 09/14/21   Alver Sorrow, NP  Nutritional Supplements (NUTRITIONAL SUPPLEMENT  PO) Take 1 Can by mouth in the morning and at bedtime.    [provider]  pantoprazole (PROTONIX) 40 MG tablet Take 1 tablet (40 mg total) by mouth 2 (two) times daily. 05/30/22   Catarina Hartshorn, MD  polyethylene glycol (MIRALAX) 17 g packet Take 17 g by mouth daily. 11/29/22   Evette Georges, MD  sertraline (ZOLOFT) 100 MG tablet Take 1 tablet (100 mg total) by mouth daily. 11/29/22   Evette Georges, MD  trimethobenzamide Dorothey Baseman) 300 MG capsule Take 1 capsule (300 mg total) by mouth daily as needed for nausea/vomiting. 11/29/22   Evette Georges, MD      Allergies    Bee venom, Atorvastatin, Diltiazem, Lopressor [metoprolol], Reglan [metoclopramide], Rosuvastatin, and Valsartan    Review of Systems   Review of Systems  Constitutional:  Positive for chills, fatigue and fever.  HENT:  Negative for congestion.   Respiratory:  Positive for cough and shortness of breath. Negative for chest tightness and wheezing.   Cardiovascular:  Negative for chest pain, palpitations, leg swelling and near-syncope.  Gastrointestinal:  Negative for abdominal pain, constipation, diarrhea, nausea and vomiting.  Musculoskeletal:  Negative for back pain.  Skin:  Negative for rash.  Neurological:  Positive for weakness. Negative for seizures, loss of consciousness and headaches.  Psychiatric/Behavioral:  Negative for agitation.   All other systems reviewed and are negative.   Physical Exam Updated Vital Signs Pulse 74   Temp 99.1 F (37.3 C) (Oral)   Ht 5\' 10"  (1.778 m)   Wt 81.6 kg   SpO2 100%   BMI 25.81 kg/m  Physical Exam Vitals and nursing note reviewed.  Constitutional:      General: He is not in acute distress.    Appearance: He is well-developed. He is ill-appearing.  HENT:     Head: Normocephalic and atraumatic.     Nose: No congestion or rhinorrhea.     Mouth/Throat:     Mouth: Mucous membranes are moist.     Pharynx: No oropharyngeal exudate or posterior oropharyngeal erythema.  Eyes:      Conjunctiva/sclera: Conjunctivae normal.     Pupils: Pupils are equal, round, and reactive to light.  Cardiovascular:     Rate and Rhythm: Normal rate and regular rhythm.     Pulses: Normal pulses.     Heart sounds: No murmur heard. Pulmonary:     Effort: Pulmonary effort is normal. No respiratory distress.     Breath sounds: Rhonchi present. No wheezing or rales.  Chest:     Chest wall: No tenderness.  Abdominal:     General: Abdomen is flat.     Palpations: Abdomen is soft.     Tenderness: There is no abdominal tenderness. There is no guarding or rebound.  Musculoskeletal:        General: No swelling or tenderness.     Cervical back: Neck supple. No tenderness.     Comments: Patient has a  small wound on his back that is not appearing cellulitic at this time.  No surrounding tenderness or erythema at this time.  Skin:    General: Skin is warm and dry.     Capillary Refill: Capillary refill takes less than 2 seconds.     Findings: No erythema.  Neurological:     Motor: No tremor or seizure activity.     Comments: Somnolent   Psychiatric:        Mood and Affect: Mood normal.     Comments: The patient is quite somnolent but will answer some questions intermittently.  He was able to move upper extremities.  Did not want to move lower extremities but did wiggle both feet.     ED Results / Procedures / Treatments   Labs (all labs ordered are listed, but only abnormal results are displayed) Labs Reviewed  RESP PANEL BY RT-PCR (RSV, FLU A&B, COVID)  RVPGX2 - Abnormal; Notable for the following components:      Result Value   SARS Coronavirus 2 by RT PCR POSITIVE (*)    All other components within normal limits  CBC WITH DIFFERENTIAL/PLATELET - Abnormal; Notable for the following components:   RBC 3.92 (*)    Hemoglobin 9.3 (*)    HCT 32.0 (*)    MCH 23.7 (*)    MCHC 29.1 (*)    RDW 17.1 (*)    Lymphs Abs 0.6 (*)    All other components within normal limits  COMPREHENSIVE  METABOLIC PANEL - Abnormal; Notable for the following components:   Sodium 134 (*)    Chloride 93 (*)    Glucose, Bld 155 (*)    BUN 31 (*)    Creatinine, Ser 5.84 (*)    Calcium 8.3 (*)    Total Protein 6.0 (*)    Albumin 2.5 (*)    AST 9 (*)    GFR, Estimated 10 (*)    All other components within normal limits  URINALYSIS, W/ REFLEX TO CULTURE (INFECTION SUSPECTED) - Abnormal; Notable for the following components:   Color, Urine AMBER (*)    Protein, ur >=300 (*)    Bacteria, UA RARE (*)    All other components within normal limits  BRAIN NATRIURETIC PEPTIDE - Abnormal; Notable for the following components:   B Natriuretic Peptide 3,500.5 (*)    All other components within normal limits  URINE CULTURE  CULTURE, BLOOD (ROUTINE X 2)  CULTURE, BLOOD (ROUTINE X 2)  LACTIC ACID, PLASMA  LIPASE, BLOOD  TSH  MAGNESIUM  PROCALCITONIN  LACTIC ACID, PLASMA  AMMONIA  HEPATITIS B SURFACE ANTIGEN  HEPATITIS B SURFACE ANTIBODY, QUANTITATIVE    EKG EKG Interpretation Date/Time:  Monday February 13 2023 11:32:08 EDT Ventricular Rate:  72 PR Interval:  185 QRS Duration:  128 QT Interval:  494 QTC Calculation: 541 R Axis:   118  Text Interpretation: Sinus rhythm Nonspecific intraventricular conduction delay Borderline repolarization abnormality when compared to prior, overall similar appearance. No STEMI Confirmed by Theda Belfast ([id number]) on 02/13/2023 11:36:24 AM  Radiology CT HEAD WO CONTRAST ( )  Result Date: 02/13/2023 CLINICAL DATA:  67 year old male with history of altered mental status and somnolence. EXAM: CT HEAD WITHOUT CONTRAST TECHNIQUE: Contiguous axial images were obtained from the base of the skull through the vertex without intravenous contrast. RADIATION DOSE REDUCTION: This exam was performed according to the departmental dose-optimization program which includes automated exposure control, adjustment of the mA and/or kV according to patient size and/or use  of  iterative reconstruction technique. COMPARISON:  Head CT 09/09/2022. FINDINGS: Brain: Moderate cerebral and mild cerebellar atrophy, advanced for age. Patchy and confluent areas of decreased attenuation are noted throughout the deep and periventricular white matter of the cerebral hemispheres bilaterally, compatible with chronic microvascular ischemic disease. No evidence of acute infarction, hemorrhage, hydrocephalus, extra-axial collection or mass lesion/mass effect. Vascular: No hyperdense vessel or unexpected calcification. Skull: Normal. Negative for fracture or focal lesion. Sinuses/Orbits: No acute finding. Other: None. IMPRESSION: 1. No acute intracranial abnormalities. 2. Moderate cerebral and mild cerebellar atrophy with extensive chronic microvascular ischemic changes in the cerebral white matter, as above. Electronically Signed   By: Trudie Reed M.D.   On: 02/13/2023 12:41   DG Chest Portable 1 View  Result Date: 02/13/2023 CLINICAL DATA:  Fever and cough.  Shortness of breath.  Fatigue. EXAM: PORTABLE CHEST 1 VIEW COMPARISON:  11/25/2022 FINDINGS: Stable mild cardiomegaly.  Prior CABG again noted. Low lung volumes again demonstrated. Diffuse interstitial infiltrates are likely due to interstitial edema. New small right pleural effusion also noted. IMPRESSION: Diffuse interstitial infiltrates, likely due to interstitial edema/congestive heart failure. New small right pleural effusion. Electronically Signed   By: Danae Orleans M.D.   On: 02/13/2023 12:36    Procedures Procedures    CRITICAL CARE Performed by: Canary Brim Arjay Jaskiewicz Total critical care time: 35 minutes Critical care time was exclusive of separately billable procedures and treating other patients. Critical care was necessary to treat or prevent imminent or life-threatening deterioration. Critical care was time spent personally by me on the following activities: development of treatment plan with patient and/or surrogate as  well as nursing, discussions with consultants, evaluation of patient's response to treatment, examination of patient, obtaining history from patient or surrogate, ordering and performing treatments and interventions, ordering and review of laboratory studies, ordering and review of radiographic studies, pulse oximetry and re-evaluation of patient's condition.  Medications Ordered in ED Medications  azithromycin (ZITHROMAX) 500 mg in sodium chloride 0.9 % 250 mL IVPB (500 mg Intravenous New Bag/Given 02/13/23 1548)  heparin injection 5,000 Units (has no administration in time range)  sodium chloride flush (NS) 0.9 % injection 3 mL (3 mLs Intravenous Given 02/13/23 1548)  acetaminophen (TYLENOL) tablet 650 mg (has no administration in time range)    Or  acetaminophen (TYLENOL) suppository 650 mg (has no administration in time range)  polyethylene glycol (MIRALAX / GLYCOLAX) packet 17 g (has no administration in time range)  Chlorhexidine Gluconate Cloth 2 % PADS 6 each (has no administration in time range)  cefTRIAXone (ROCEPHIN) 1 g in sodium chloride 0.9 % 100 mL IVPB (1 g Intravenous New Bag/Given 02/13/23 1547)    ED Course/ Medical Decision Making/ A&P                                 Medical Decision Making Amount and/or Complexity of Data Reviewed Labs: ordered. Radiology: ordered.  Risk Decision regarding hospitalization.    SANJIT SCIANDRA is a 67 y.o. male with a past medical history significant for hypertension, hyperlipidemia, CAD status post CABG, diabetes, previous stroke, GERD, CHF, ESRD on dialysis MWF, and recent COVID diagnosis presents with altered mental status, fatigue, somnolence, and missing dialysis today.  According to EMS report to nursing, patient had a fever today of 102 was brought in for evaluation.  Patient is somnolent but able to wake up and say yes and no to some  questions.  He is reporting some cough and shortness of breath but is denying chest pain.  He is  denying abdominal pain.  Denying headache or neck pain.  He is on 4 L at baseline he reports an oxygen saturations are in the 90s now.  On my exam, lungs do have some coarseness, chest is nontender.  Abdomen is nontender.  He is laying in bed very somnolent but is following some commands.  Given his fever and recent COVID infection, will get x-ray to rule out post-COVID ammonia.  I cannot find a positive COVID test in the system so we will be checking to confirm.  Will get screening labs.  Given his somnolence and history of previous trouble get a head CT.  Will get urinalysis and other labs.  Given his altered mental status from his reported baseline, anticipate admission for further management.  12:44 PM Spoke to patient's Sister Amy who was able provide 1 formation.  She reports that the patient has been through a lot recently including UTIs, aspiration pneumonia, and all of his problems with diabetes and ESRD.  She says that even yesterday he was getting more fatigued, not eating or drinking as much, and was feeling ill.  She felt he was warm to the touch but did not seem to have a fever yesterday.  She says that the facility called her to tell her that he was too fatigued and somnolent to even sit up and be able to get in a wheelchair to go to dialysis today prompting them to send him in.  She is concerned that his fatigue has worsened and he may have some other infection going on.  Given this new information that he was to weak and fatigued to even go to dialysis today anticipate he will likely need admission.  Will wait for other workup to complete and then anticipate admission.  I looked at the patient's back and there is not a large infection.  Do not see evidence of acute cellulitis, she a well appearing wound that is has a cream on it.  Workup continues to return.  Patient does not have leukocytosis and has mild anemia.  Lactic acid not elevated.  Lipase not elevated.  Metabolic panel shows  elevated creatinine but he again has missed dialysis today so this is not unexpected.  Otherwise some electrolyte abnormalities but no critical potassium abnormality.  CT head did not show acute stroke and chest x-ray shows new pleural effusion and some opacities that could be inflammation versus fluid.  Given the patient's cough and shortness of breath and fever I suspect he may have pneumonia.  Will call nephrology as I think he will need dialysis during his admission but will give antibiotics for suspected post COVID-pneumonia and will call for admission.        Final Clinical Impression(s) / ED Diagnoses Final diagnoses:  Somnolence  Community acquired pneumonia, unspecified laterality   Clinical Impression: 1. Somnolence   2. Community acquired pneumonia, unspecified laterality     Disposition: Admit  This note was prepared with assistance of Conservation officer, historic buildings. Occasional wrong-word or sound-a-like substitutions may have occurred due to the inherent limitations of voice recognition software.     Audiel Scheiber, Canary Brim, MD 02/13/23 [phone]

## 2023-02-13 NOTE — ED Notes (Signed)
ED TO INPATIENT HANDOFF REPORT  ED Nurse Name and Phone #: Vernona Rieger [phone]  S Name/Age/Gender [name] 67 y.o. male Room/Bed: 038C/038C  Code Status   Code Status: Do not attempt resuscitation (DNR) PRE-ARREST INTERVENTIONS DESIRED  Home/SNF/Other Skilled nursing facility Patient oriented to: self and place Is this baseline? No   Triage Complete: Triage complete  Chief Complaint Acute encephalopathy [G93.40]  Triage Note PT BIB EMS from [facility],SNF, for general weakness and AMS. PT was recently covid+ and presented to EMS with fever of 10.84F. EMS gave 650mg  tylenol. Hs DM and dialysis (MWF).   EMS 150/68 80 HR 97% 4lpm [place] 154 BG 101.2F temp- temporal    Allergies Allergies  Allergen Reactions   Bee Venom Anaphylaxis   Atorvastatin Other (See Comments)    myalgia   Diltiazem Itching   Lopressor [Metoprolol]     NIGHTMARES. Pt tolerating this now 11/25/22   Oxycodone Other (See Comments)    Reaction not listed on MAR    Reglan [Metoclopramide] Other (See Comments)    Suicidal    Rosuvastatin Other (See Comments)    myalgias   Valsartan Itching    Level of Care/Admitting Diagnosis ED Disposition     ED Disposition  Admit   Condition  --   Comment  Hospital Area: [facility] [[id number]]  Level of Care: Progressive [102]  Admit to Progressive based on following criteria: MULTISYSTEM THREATS such as stable sepsis, metabolic/electrolyte imbalance with or without encephalopathy that is responding to early treatment.  Admit to Progressive based on following criteria: RESPIRATORY PROBLEMS hypoxemic/hypercapnic respiratory failure that is responsive to NIPPV (BiPAP) or High Flow Nasal Cannula (6-80 lpm). Frequent assessment/intervention, no > Q2 hrs < Q4 hrs, to maintain oxygenation and pulmonary hygiene.  May place patient in observation at [facility] or [facility] if equivalent level of care is available:: No  Covid Evaluation:  Confirmed COVID Positive  Diagnosis: Acute encephalopathy [[id number]]  Admitting Physician: Synetta Fail [[id number]]  Attending Physician: Synetta Fail [[id number]]          B Medical/Surgery History Past Medical History:  Diagnosis Date   Acute blood loss anemia 03/20/2021   Acute metabolic encephalopathy 03/03/2022   Acute on chronic respiratory failure with hypoxia (HCC) 02/28/2022   Anemia    Arthritis    hips shoulders   Atypical chest pain 01/30/2022   CAD (coronary artery disease) 03/03/2019   Cancer (HCC)    Esophageal cancer 2022   CHF (congestive heart failure) (HCC) 2020   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   Admitted 11/14-11/20/19-diuresed 10L   Complete rotator cuff tear 08/26/2020   Depression 05/31/2021   Fatigue 10/14/2020   gram neg rod Bacteremia 03/01/2022   Hip fracture (HCC) 09/09/2022   Hypertension    Myocardial infarction (HCC)    Sleep apnea    uses a bipap machine   Stroke (HCC) 12/14/2020   no last weakness or paralysis   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   Past Surgical History:  Procedure Laterality Date   AV FISTULA PLACEMENT Left 12/21/2021   Procedure: LEFT ARM ARTERIOVENOUS (AV) FISTULA CREATION;  Surgeon: Larina Earthly, MD;  Location: [facility];  Service: Vascular;  Laterality: Left;   BIOPSY  06/27/2019   Procedure: BIOPSY;  Surgeon: Malissa Hippo, MD;  [facility];  Service: Endoscopy;;  ascending colon polyp  BIOPSY  10/21/2020   Procedure: BIOPSY;  Surgeon: Malissa Hippo, MD;  [facility];  Service: Endoscopy;;  esophageal,gastric polyp   BIOPSY  02/24/2021   Procedure: BIOPSY;  Surgeon: Malissa Hippo, MD;  [facility];  Service: Endoscopy;;  distal and proximal esophageal biopsies    BIOPSY  05/30/2022   Procedure: BIOPSY;  Surgeon: Lanelle Bal, DO;  [facility];  Service: Endoscopy;;   CARDIAC SURGERY     CATARACT EXTRACTION     COLONOSCOPY N/A 06/27/2019   [name]:Diverticulosis in the entire examined colon. tubular adenoma in ascending colon, 4mm tubular adenoma in prox sigmoid. external hemorrhoids   CORONARY ARTERY BYPASS GRAFT N/A 03/25/2019   Procedure: CORONARY ARTERY BYPASS GRAFTING (CABG) X  4 USING LEFT INTERNAL MAMMARY ARTERY AND RIGHT SAPHENOUS VEIN GRAFTS;  Surgeon: Corliss Skains, MD;  Location: [facility];  Service: Open Heart Surgery;  Laterality: N/A;   ESOPHAGEAL BRUSHING  03/08/2022   Procedure: ESOPHAGEAL BRUSHING;  Surgeon: Lanelle Bal, DO;  [facility];  Service: Endoscopy;;   ESOPHAGOGASTRODUODENOSCOPY (EGD) WITH PROPOFOL N/A 10/21/2020   rehman:Normal hypopharynx.Normal proximal esophagus and mid esophagus.Esophageal mucosal changes consistent with long-segment Barrett's esophagus. (Focal low-grade dysplasia and atypia proximally) z line irregular 37 cm from incisors, 3 cm HH, single gastric polyp (fundic gland) normal duodenal bulb/second portion of duodenum, proximal margin of Barrett's at 34 cm   ESOPHAGOGASTRODUODENOSCOPY (EGD) WITH PROPOFOL N/A 02/24/2021   Procedure: ESOPHAGOGASTRODUODENOSCOPY (EGD) WITH PROPOFOL;  Surgeon: Malissa Hippo, MD;  [facility];  Service: Endoscopy;  Laterality: N/A;  1:35   ESOPHAGOGASTRODUODENOSCOPY (EGD) WITH PROPOFOL N/A 03/21/2021   Procedure: ESOPHAGOGASTRODUODENOSCOPY (EGD) WITH PROPOFOL;  Surgeon: Jeani Hawking, MD;  Location: [facility] ENDOSCOPY;  Service: Endoscopy;  Laterality: N/A;   ESOPHAGOGASTRODUODENOSCOPY (EGD) WITH PROPOFOL N/A 10/23/2021   Procedure: ESOPHAGOGASTRODUODENOSCOPY (EGD) WITH PROPOFOL;  Surgeon: Sherrilyn Rist, MD;  Location: [facility] ENDOSCOPY;  Service: Gastroenterology;  Laterality: N/A;   ESOPHAGOGASTRODUODENOSCOPY (EGD) WITH PROPOFOL N/A 03/08/2022   Procedure: ESOPHAGOGASTRODUODENOSCOPY (EGD) WITH PROPOFOL;  Surgeon: Lanelle Bal,  DO;  [facility];  Service: Endoscopy;  Laterality: N/A;   ESOPHAGOGASTRODUODENOSCOPY (EGD) WITH PROPOFOL N/A 05/27/2022   Procedure: ESOPHAGOGASTRODUODENOSCOPY (EGD) WITH PROPOFOL;  Surgeon: Dolores Frame, MD;  [facility];  Service: Gastroenterology;  Laterality: N/A;   ESOPHAGOGASTRODUODENOSCOPY (EGD) WITH PROPOFOL N/A 05/30/2022   Procedure: ESOPHAGOGASTRODUODENOSCOPY (EGD) WITH PROPOFOL;  Surgeon: Lanelle Bal, DO;  [facility];  Service: Endoscopy;  Laterality: N/A;   EXCISION MORTON'S NEUROMA     EYE SURGERY Left    retina   INCISION AND DRAINAGE OF WOUND Right 06/28/2021   Procedure: IRRIGATION AND DEBRIDEMENT WOUND;  Surgeon: Allena Napoleon, MD;  Location: [facility];  Service: Plastics;  Laterality: Right;  1.5 hour   INSERTION OF DIALYSIS CATHETER Right 12/21/2021   Procedure: INSERTION OF TUNNELED DIALYSIS CATHETER;  Surgeon: Larina Earthly, MD;  Location: [facility];  Service: Vascular;  Laterality: Right;   INTRAMEDULLARY (IM) NAIL INTERTROCHANTERIC Right 09/10/2022   Procedure: INTRAMEDULLARY (IM) NAIL INTERTROCHANTERIC;  Surgeon: Joen Aime Carreras, MD;  Location[facility];  Service: Orthopedics;  Laterality: Right;   LEFT HEART CATH AND CORS/GRAFTS ANGIOGRAPHY N/A 02/01/2022   Procedure: LEFT HEART CATH AND CORS/GRAFTS ANGIOGRAPHY;  Surgeon: Swaziland, Peter M, MD;  Location: [facility] INVASIVE CV LAB;  Service: Cardiovascular;  Laterality: N/A;   POLYPECTOMY  06/27/2019   Procedure: POLYPECTOMY;  Surgeon: Malissa Hippo, MD;  [facility];  Service: Endoscopy;;  proximal sigmoid colon   RIGHT/LEFT HEART CATH AND CORONARY ANGIOGRAPHY N/A 03/12/2019   Procedure: RIGHT/LEFT HEART CATH AND CORONARY ANGIOGRAPHY;  Surgeon: Corky Crafts, MD;  Location: [facility] INVASIVE CV LAB;  Service: Cardiovascular;  Laterality: N/A;   SHOULDER ARTHROSCOPY WITH ROTATOR CUFF REPAIR AND SUBACROMIAL DECOMPRESSION Right 08/26/2020   Procedure: RIGHT SHOULDER  MINI OPEN ROTATOR CUFF REPAIR AND SUBACROMIAL DECOMPRESSION WITH PATCH GRAFT;  Surgeon: Jene Every, MD;  Location: [facility];  Service: Orthopedics;  Laterality: Right;  90 MINS GENERAL WITH BLOCK   SKIN SPLIT GRAFT Right 06/28/2021   Procedure: SKIN GRAFT SPLIT THICKNESS;  Surgeon: Allena Napoleon, MD;  Location: [facility];  Service: Plastics;  Laterality: Right;   TEE WITHOUT CARDIOVERSION N/A 03/25/2019   Procedure: TRANSESOPHAGEAL ECHOCARDIOGRAM (TEE);  Surgeon: Corliss Skains, MD;  Location: [facility] OR;  Service: Open Heart Surgery;  Laterality: N/A;     A IV Location/Drains/Wounds Patient Lines/Drains/Airways Status     Active Line/Drains/Airways     Name Placement date Placement time Site Days   Peripheral IV 02/13/23 20 G Right Antecubital 02/13/23  1122  Antecubital  less than 1   Peripheral IV 02/13/23 22 G Posterior;Right Hand 02/13/23  1132  Hand  less than 1   Fistula / Graft Left Forearm Arteriovenous fistula 12/21/21  1112  Forearm  419            Intake/Output Last 24 hours No intake or output data in the 24 hours ending 02/13/23 1527  Labs/Imaging Results for orders placed or performed during the hospital encounter of 02/13/23 (from the past 48 hour(s))  CBC with Differential     Status: Abnormal   Collection Time: 02/13/23 11:29 AM  Result Value Ref Range   WBC 5.7 4.0 - 10.5 K/uL   RBC 3.92 (L) 4.22 - 5.81 MIL/uL   Hemoglobin 9.3 (L) 13.0 - 17.0 g/dL   HCT 16.1 (L) 09.6 - 04.5 %   MCV 81.6 80.0 - 100.0 fL   MCH 23.7 (L) 26.0 - 34.0 pg   MCHC 29.1 (L) 30.0 - 36.0 g/dL   RDW 40.9 (H) 81.1 - 91.4 %   Platelets 203 150 - 400 K/uL   nRBC 0.0 0.0 - 0.2 %   Neutrophils Relative % 78 %   Neutro Abs 4.5 1.7 - 7.7 K/uL   Lymphocytes Relative 11 %   Lymphs Abs 0.6 (L) 0.7 - 4.0 K/uL   Monocytes Relative 9 %   Monocytes Absolute 0.5 0.1 - 1.0 K/uL   Eosinophils Relative 1 %   Eosinophils Absolute 0.0 0.0 - 0.5 K/uL   Basophils Relative 0 %   Basophils Absolute  0.0 0.0 - 0.1 K/uL   Immature Granulocytes 1 %   Abs Immature Granulocytes 0.04 0.00 - 0.07 K/uL    Comment: Performed at [facility], [address] [place], [place] [address]  Comprehensive metabolic panel     Status: Abnormal   Collection Time: 02/13/23 11:29 AM  Result Value Ref Range   Sodium 134 (L) 135 - 145 mmol/L   Potassium 3.6 3.5 - 5.1 mmol/L   Chloride 93 (L) 98 - 111 mmol/L   CO2 28 22 - 32 mmol/L   Glucose, Bld 155 (H) 70 - 99 mg/dL    Comment: Glucose reference range applies only to samples taken after fasting for at least 8 hours.   BUN 31 (H)  8 - 23 mg/dL   Creatinine, Ser 1.02 (H) 0.61 - 1.24 mg/dL   Calcium 8.3 (L) 8.9 - 10.3 mg/dL   Total Protein 6.0 (L) 6.5 - 8.1 g/dL   Albumin 2.5 (L) 3.5 - 5.0 g/dL   AST 9 (L) 15 - 41 U/L   ALT 6 0 - 44 U/L   Alkaline Phosphatase 61 38 - 126 U/L   Total Bilirubin 1.0 0.3 - 1.2 mg/dL   GFR, Estimated 10 (L) >60 mL/min    Comment: (NOTE) Calculated using the CKD-EPI Creatinine Equation (2021)    Anion gap 13 5 - 15    Comment: Performed at [facility], [address] [place], [place] [address]  Lactic acid, plasma     Status: None   Collection Time: 02/13/23 11:29 AM  Result Value Ref Range   Lactic Acid, Venous 1.0 0.5 - 1.9 mmol/L    Comment: Performed at [facility], [address], [place], [place] [address]  Brain natriuretic peptide     Status: Abnormal   Collection Time: 02/13/23 11:29 AM  Result Value Ref Range   B Natriuretic Peptide 3,500.5 (H) 0.0 - 100.0 pg/mL    Comment: Performed at [facility], [address], [place], [place] [address]  Lipase, blood     Status: None   Collection Time: 02/13/23 11:29 AM  Result Value Ref Range   Lipase 31 11 - 51 U/L    Comment: Performed at [facility], [address], [place], [place] [address]  TSH     Status: None   Collection Time: 02/13/23 11:29 AM  Result Value Ref Range   TSH 4.229 0.350 - 4.500 uIU/mL    Comment:  Performed by a 3rd Generation assay with a functional sensitivity of <=0.01 uIU/mL. Performed at [facility], [address] [place], [place] [address]   Resp panel by RT-PCR (RSV, Flu A&B, Covid) Peripheral     Status: Abnormal   Collection Time: 02/13/23 12:01 PM   Specimen: Peripheral; Nasal Swab  Result Value Ref Range   SARS Coronavirus 2 by RT PCR POSITIVE (A) NEGATIVE   Influenza A by PCR NEGATIVE NEGATIVE   Influenza B by PCR NEGATIVE NEGATIVE    Comment: (NOTE) The Xpert Xpress SARS-CoV-2/FLU/RSV plus assay is intended as an aid in the diagnosis of influenza from Nasopharyngeal swab specimens and should not be used as a sole basis for treatment. Nasal washings and aspirates are unacceptable for Xpert Xpress SARS-CoV-2/FLU/RSV testing.  Fact Sheet for Patients: BloggerCourse.com  Fact Sheet for Healthcare Providers: SeriousBroker.it  This test is not yet approved or cleared by the [place] FDA and has been authorized for detection and/or diagnosis of SARS-CoV-2 by FDA under an Emergency Use Authorization (EUA). This EUA will remain in effect (meaning this test can be used) for the duration of the COVID-19 declaration under Section 564(b)(1) of the Act, 21 U.S.C. section 360bbb-3(b)(1), unless the authorization is terminated or revoked.     Resp Syncytial Virus by PCR NEGATIVE NEGATIVE    Comment: (NOTE) Fact Sheet for Patients: BloggerCourse.com  Fact Sheet for Healthcare Providers: SeriousBroker.it  This test is not yet approved or cleared by the [place] FDA and has been authorized for detection and/or diagnosis of SARS-CoV-2 by FDA under an Emergency Use Authorization (EUA). This EUA will remain in effect (meaning this test can be used) for the duration of the COVID-19 declaration under Section 564(b)(1) of  the Act, 21 U.S.C. section  360bbb-3(b)(1), unless the authorization is terminated or revoked.  Performed at [facility], [address] [place], [place] [address]   Urinalysis, w/ Reflex to Culture (Infection Suspected) -Urine, Clean Catch     Status: Abnormal   Collection Time: 02/13/23  1:56 PM  Result Value Ref Range   Specimen Source CCU     Comment: CORRECTED ON 09/02 AT 1415: PREVIOUSLY REPORTED AS URINE, CLEAN CATCH   Color, Urine AMBER (A) YELLOW    Comment: BIOCHEMICALS MAY BE AFFECTED BY COLOR   APPearance CLEAR CLEAR   Specific Gravity, Urine 1.015 1.005 - 1.030   pH 7.0 5.0 - 8.0   Glucose, UA NEGATIVE NEGATIVE mg/dL   Hgb urine dipstick NEGATIVE NEGATIVE   Bilirubin Urine NEGATIVE NEGATIVE   Ketones, ur NEGATIVE NEGATIVE mg/dL   Protein, ur >=604 (A) NEGATIVE mg/dL   Nitrite NEGATIVE NEGATIVE   Leukocytes,Ua NEGATIVE NEGATIVE   RBC / HPF 6-10 0 - 5 RBC/hpf   WBC, UA 0-5 0 - 5 WBC/hpf    Comment:        Reflex urine culture not performed if WBC <=10, OR if Squamous epithelial cells >5. If Squamous epithelial cells >5 suggest recollection.    Bacteria, UA RARE (A) NONE SEEN   Squamous Epithelial / HPF 0-5 0 - 5 /HPF   Hyaline Casts, UA PRESENT     Comment: Performed at [facility], [address], [place], [place] [address]   CT HEAD WO CONTRAST ( )  Result Date: 02/13/2023 CLINICAL DATA:  67 year old male with history of altered mental status and somnolence. EXAM: CT HEAD WITHOUT CONTRAST TECHNIQUE: Contiguous axial images were obtained from the base of the skull through the vertex without intravenous contrast. RADIATION DOSE REDUCTION: This exam was performed according to the departmental dose-optimization program which includes automated exposure control, adjustment of the mA and/or kV according to patient size and/or use of iterative reconstruction technique. COMPARISON:  Head CT 09/09/2022. FINDINGS: Brain: Moderate cerebral and mild cerebellar atrophy, advanced for age.  Patchy and confluent areas of decreased attenuation are noted throughout the deep and periventricular white matter of the cerebral hemispheres bilaterally, compatible with chronic microvascular ischemic disease. No evidence of acute infarction, hemorrhage, hydrocephalus, extra-axial collection or mass lesion/mass effect. Vascular: No hyperdense vessel or unexpected calcification. Skull: Normal. Negative for fracture or focal lesion. Sinuses/Orbits: No acute finding. Other: None. IMPRESSION: 1. No acute intracranial abnormalities. 2. Moderate cerebral and mild cerebellar atrophy with extensive chronic microvascular ischemic changes in the cerebral white matter, as above. Electronically Signed   By: Trudie Reed M.D.   On: 02/13/2023 12:41   DG Chest Portable 1 View  Result Date: 02/13/2023 CLINICAL DATA:  Fever and cough.  Shortness of breath.  Fatigue. EXAM: PORTABLE CHEST 1 VIEW COMPARISON:  11/25/2022 FINDINGS: Stable mild cardiomegaly.  Prior CABG again noted. Low lung volumes again demonstrated. Diffuse interstitial infiltrates are likely due to interstitial edema. New small right pleural effusion also noted. IMPRESSION: Diffuse interstitial infiltrates, likely due to interstitial edema/congestive heart failure. New small right pleural effusion. Electronically Signed   By: Danae Orleans M.D.   On: 02/13/2023 12:36    Pending Labs Unresulted Labs (From admission, onward)     Start     Ordered   02/14/23 0500  Comprehensive metabolic panel  Tomorrow morning,   R        02/13/23 1507   02/14/23 0500  CBC  Tomorrow morning,  R        02/13/23 1507   02/13/23 1508  Procalcitonin  Daily,   R     References:    Procalcitonin Lower Respiratory Tract Infection AND Sepsis Procalcitonin Algorithm   02/13/23 1507   02/13/23 1455  Magnesium  Add-on,   AD        02/13/23 1507   02/13/23 1201  Blood culture (routine x 2)  BLOOD CULTURE X 2,   R (with STAT occurrences)      02/13/23 1200   02/13/23 1200   Ammonia  Once,   STAT        02/13/23 1200   02/13/23 1156  Lactic acid, plasma  (Lactic Acid)  Now then every 2 hours,   R (with STAT occurrences)      02/13/23 1200   02/13/23 1156  Urine Culture  Once,   URGENT       Question:  Indication  Answer:  Altered mental status (if no other cause identified)   02/13/23 1200            Vitals/Pain Today's Vitals   02/13/23 1330 02/13/23 1400 02/13/23 1430 02/13/23 1526  BP: 122/62 132/67 (!) 110/47   Pulse: 67 66 63   Resp: 14 11 11    Temp:    (!) 97.5 F (36.4 C)  TempSrc:    Oral  SpO2: 100% 100% 100%   Weight:      Height:        Isolation Precautions No active isolations  Medications Medications  cefTRIAXone (ROCEPHIN) 1 g in sodium chloride 0.9 % 100 mL IVPB (has no administration in time range)  azithromycin (ZITHROMAX) 500 mg in sodium chloride 0.9 % 250 mL IVPB (has no administration in time range)  heparin injection 5,000 Units (has no administration in time range)  sodium chloride flush (NS) 0.9 % injection 3 mL (has no administration in time range)  acetaminophen (TYLENOL) tablet 650 mg (has no administration in time range)    Or  acetaminophen (TYLENOL) suppository 650 mg (has no administration in time range)  polyethylene glycol (MIRALAX / GLYCOLAX) packet 17 g (has no administration in time range)    Mobility non-ambulatory.     Focused Assessments Pulmonary Assessment Handoff:  Lung sounds: Bilateral Breath Sounds: Diminished O2 Device: Nasal Cannula O2 Flow Rate (L/min): 4 L/min    R Recommendations: See Admitting Provider Note  Report given to:   Additional Notes: Wearing brief.

## 2023-02-13 NOTE — ED Notes (Signed)
Transfer of care report given to yellow RN, Vernona Rieger.

## 2023-02-13 NOTE — Consult Note (Addendum)
[facility] Renal Consultation Note    Indication for Consultation:  Management of ESRD/hemodialysis, anemia, hypertension/volume, and secondary hyperparathyroidism. PCP:  HPI: [name] is a 67 y.o. male with PMH including HTN, HLD, DM, ESRD on HD, PAG, CAD, CHF, PA and CVA who presents with AMS and fatigue. He was also admitted in June 2024 with SVT and tested positive for COVID about 2 weeks ago. Patient resides in a SNF. Family reports he appeared fatigued with decreased appetite yesterday. This morning, staff at SNF reported he was more fatigued, somnolent, and was unable to get up for HD. Temp 102F reported. Per chart, also has a history of UTIs and aspiration pneumonia. EDW work up with Hgb 9.3 (close to outpatient baseline), BNP 3500, CXR with diffuse interstitial infiltrates consistent with CHF. Further work up ongoing. He did receive a dose of ceftriaxone and azithromycin. Nephrology was consulted for management of ESRD.   History is somewhat limited, pt is alert and answers yes or no questions with some repeat prompting. He denies SOB, CP, dizziness, abdominal pain, N/V/D, fever, and chills. Cannot tell me if he went to HD on Friday, per outpatient records it appears he attended HD Friday but missed Wednesday. He came in below his previous EDW and had a 3.4kg weight change documented but UF goal was 1L, so unclear how much fluid was removed.   Past Medical History:  Diagnosis Date   Acute blood loss anemia 03/20/2021   Acute metabolic encephalopathy 03/03/2022   Acute on chronic respiratory failure with hypoxia (HCC) 02/28/2022   Anemia    Arthritis    hips shoulders   Atypical chest pain 01/30/2022   CAD (coronary artery disease) 03/03/2019   Cancer (HCC)    Esophageal cancer 2022   CHF (congestive heart failure) (HCC) 2020   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   Admitted 11/14-11/20/19-diuresed 10L   Complete rotator cuff  tear 08/26/2020   Depression 05/31/2021   Fatigue 10/14/2020   gram neg rod Bacteremia 03/01/2022   Hip fracture (HCC) 09/09/2022   Hypertension    Myocardial infarction (HCC)    Sleep apnea    uses a bipap machine   Stroke (HCC) 12/14/2020   no last weakness or paralysis   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   Past Surgical History:  Procedure Laterality Date   AV FISTULA PLACEMENT Left 12/21/2021   Procedure: LEFT ARM ARTERIOVENOUS (AV) FISTULA CREATION;  Surgeon: Larina Earthly, MD;  Location: [facility];  Service: Vascular;  Laterality: Left;   BIOPSY  06/27/2019   Procedure: BIOPSY;  Surgeon: Malissa Hippo, MD;  Location: [facility];  Service: Endoscopy;;  ascending colon polyp   BIOPSY  10/21/2020   Procedure: BIOPSY;  Surgeon: Malissa Hippo, MD;  Location: [facility];  Service: Endoscopy;;  esophageal,gastric polyp   BIOPSY  02/24/2021   Procedure: BIOPSY;  Surgeon: Malissa Hippo, MD;  [facility]: [facility];  Service: Endoscopy;;  distal and proximal esophageal biopsies    BIOPSY  05/30/2022   Procedure: BIOPSY;  Surgeon: Lanelle Bal, DO;  [facility]: [facility];  Service: Endoscopy;;   CARDIAC SURGERY     CATARACT EXTRACTION     COLONOSCOPY N/A 06/27/2019   [name]:Diverticulosis in the entire examined colon. tubular adenoma in ascending colon, 4mm tubular adenoma in prox sigmoid. external hemorrhoids   CORONARY ARTERY BYPASS GRAFT N/A  03/25/2019   Procedure: CORONARY ARTERY BYPASS GRAFTING (CABG) X  4 USING LEFT INTERNAL MAMMARY ARTERY AND RIGHT SAPHENOUS VEIN GRAFTS;  Surgeon: Corliss Skains, MD;  Location: [facility];  Service: Open Heart Surgery;  Laterality: N/A;   ESOPHAGEAL BRUSHING  03/08/2022   Procedure: ESOPHAGEAL BRUSHING;  Surgeon: Lanelle Bal, DO;  [facility]: [facility];  Service: Endoscopy;;   ESOPHAGOGASTRODUODENOSCOPY (EGD) WITH PROPOFOL N/A 10/21/2020    rehman:Normal hypopharynx.Normal proximal esophagus and mid esophagus.Esophageal mucosal changes consistent with long-segment Barrett's esophagus. (Focal low-grade dysplasia and atypia proximally) z line irregular 37 cm from incisors, 3 cm HH, single gastric polyp (fundic gland) normal duodenal bulb/second portion of duodenum, proximal margin of Barrett's at 34 cm   ESOPHAGOGASTRODUODENOSCOPY (EGD) WITH PROPOFOL N/A 02/24/2021   Procedure: ESOPHAGOGASTRODUODENOSCOPY (EGD) WITH PROPOFOL;  Surgeon: Malissa Hippo, MD;  [facility];  Service: Endoscopy;  Laterality: N/A;  1:35   ESOPHAGOGASTRODUODENOSCOPY (EGD) WITH PROPOFOL N/A 03/21/2021   Procedure: ESOPHAGOGASTRODUODENOSCOPY (EGD) WITH PROPOFOL;  Surgeon: Jeani Hawking, MD;  Location[facility] ENDOSCOPY;  Service: Endoscopy;  Laterality: N/A;   ESOPHAGOGASTRODUODENOSCOPY (EGD) WITH PROPOFOL N/A 10/23/2021   Procedure: ESOPHAGOGASTRODUODENOSCOPY (EGD) WITH PROPOFOL;  Surgeon: Sherrilyn Rist, MD;  Location: [facility] ENDOSCOPY;  Service: Gastroenterology;  Laterality: N/A;   ESOPHAGOGASTRODUODENOSCOPY (EGD) WITH PROPOFOL N/A 03/08/2022   Procedure: ESOPHAGOGASTRODUODENOSCOPY (EGD) WITH PROPOFOL;  Surgeon: Lanelle Bal, DO;  [facility];  Service: Endoscopy;  Laterality: N/A;   ESOPHAGOGASTRODUODENOSCOPY (EGD) WITH PROPOFOL N/A 05/27/2022   Procedure: ESOPHAGOGASTRODUODENOSCOPY (EGD) WITH PROPOFOL;  Surgeon: Dolores Frame, MD;  [facility];  Service: Gastroenterology;  Laterality: N/A;   ESOPHAGOGASTRODUODENOSCOPY (EGD) WITH PROPOFOL N/A 05/30/2022   Procedure: ESOPHAGOGASTRODUODENOSCOPY (EGD) WITH PROPOFOL;  Surgeon: Lanelle Bal, DO;  [facility];  Service: Endoscopy;  Laterality: N/A;   EXCISION MORTON'S NEUROMA     EYE SURGERY Left    retina   INCISION AND DRAINAGE OF WOUND Right 06/28/2021   Procedure: IRRIGATION AND DEBRIDEMENT WOUND;  Surgeon: Allena Napoleon, MD;  Location: [facility];   Service: Plastics;  Laterality: Right;  1.5 hour   INSERTION OF DIALYSIS CATHETER Right 12/21/2021   Procedure: INSERTION OF TUNNELED DIALYSIS CATHETER;  Surgeon: Larina Earthly, MD;  Location: [facility];  Service: Vascular;  Laterality: Right;   INTRAMEDULLARY (IM) NAIL INTERTROCHANTERIC Right 09/10/2022   Procedure: INTRAMEDULLARY (IM) NAIL INTERTROCHANTERIC;  Surgeon: Joen Laura, MD;  Location: [facility];  Service: Orthopedics;  Laterality: Right;   LEFT HEART CATH AND CORS/GRAFTS ANGIOGRAPHY N/A 02/01/2022   Procedure: LEFT HEART CATH AND CORS/GRAFTS ANGIOGRAPHY;  Surgeon: Swaziland, Peter M, MD;  Location: [facility] INVASIVE CV LAB;  Service: Cardiovascular;  Laterality: N/A;   POLYPECTOMY  06/27/2019   Procedure: POLYPECTOMY;  Surgeon: Malissa Hippo, MD;  [facility];  Service: Endoscopy;;  proximal sigmoid colon   RIGHT/LEFT HEART CATH AND CORONARY ANGIOGRAPHY N/A 03/12/2019   Procedure: RIGHT/LEFT HEART CATH AND CORONARY ANGIOGRAPHY;  Surgeon: Corky Crafts, MD;  Location: [facility] INVASIVE CV LAB;  Service: Cardiovascular;  Laterality: N/A;   SHOULDER ARTHROSCOPY WITH ROTATOR CUFF REPAIR AND SUBACROMIAL DECOMPRESSION Right 08/26/2020   Procedure: RIGHT SHOULDER MINI OPEN ROTATOR CUFF REPAIR AND SUBACROMIAL DECOMPRESSION WITH PATCH GRAFT;  Surgeon: Jene Every, MD;  Location: [facility];  Service: Orthopedics;  Laterality: Right;  90 MINS GENERAL WITH BLOCK   SKIN SPLIT GRAFT Right 06/28/2021   Procedure: SKIN GRAFT SPLIT THICKNESS;  Surgeon: Merry Proud  S, MD;  Location: [facility];  Service: Plastics;  Laterality: Right;   TEE WITHOUT CARDIOVERSION N/A 03/25/2019   Procedure: TRANSESOPHAGEAL ECHOCARDIOGRAM (TEE);  Surgeon: Corliss Skains, MD;  Location: [facility] OR;  Service: Open Heart Surgery;  Laterality: N/A;   Family History  Problem Relation Age of Onset   Colon cancer Mother    Heart Problems Father    Diabetes Father    Valvular heart disease Father    Sleep apnea Neg Hx     Stroke Neg Hx    Social History:  reports that he quit smoking about 34 years ago. His smoking use included cigarettes. He has been exposed to tobacco smoke. He has never used smokeless tobacco. He reports that he does not currently use alcohol after a past usage of about 2.0 standard drinks of alcohol per week. He reports that he does not use drugs.  ROS: As per HPI otherwise negative.   Physical Exam: Vitals:   02/13/23 1300 02/13/23 1330 02/13/23 1400 02/13/23 1430  BP: 98/68 122/62 132/67 (!) 110/47  Pulse: 66 67 66 63  Resp: 12 14 11 11   Temp:      TempSrc:      SpO2: 100% 100% 100% 100%  Weight:      Height:         General: Alert, tired appearing male Head: Normocephalic, atraumatic, sclera non-icteric, mucus membranes are moist. Neck: JVD not elevated. Lungs:  CTA bilaterally. Breathing is unlabored. Heart: RRR with normal S1, S2. No murmurs, rubs, or gallops appreciated. Abdomen: Soft, non-tender, non-distended with normoactive bowel sounds. No rebound/guarding. No obvious abdominal masses. Musculoskeletal:  Strength and tone appear normal for age. Lower extremities: No edema b/l lower extremities Dialysis Access: LUE AVF + t/b  Allergies  Allergen Reactions   Bee Venom Anaphylaxis   Atorvastatin Other (See Comments)    myalgia   Diltiazem Itching   Lopressor [Metoprolol]     NIGHTMARES. Pt tolerating this now 11/25/22   Oxycodone Other (See Comments)    Reaction not listed on MAR    Reglan [Metoclopramide] Other (See Comments)    Suicidal    Rosuvastatin Other (See Comments)    myalgias   Valsartan Itching   Prior to Admission medications   Medication Sig Start Date End Date Taking? Authorizing Provider  amiodarone (PACERONE) 200 MG tablet Take 1 tablet (200 mg total) by mouth daily. 12/27/22  Yes Marinus Maw, MD  calcium acetate (PHOSLO) 667 MG capsule Take 667 mg by mouth See admin instructions. Take 667 mg by mouth twice daily on Monday, Wednesday,  and Friday   Yes [provider]  Calcium Acetate 667 MG TABS Take 667 mg by mouth See admin instructions. Take 667 mg by mouth three times daily on Tuesday, Thursday, Saturday, and Sunday   Yes [provider]  doxazosin (CARDURA) 2 MG tablet TAKE 1 TABLET BY MOUTH DAILY 07/29/22  Yes [name], Scott A, MD  EPINEPHrine 0.3 mg/0.3 mL IJ SOAJ injection Inject 0.3 mg into the muscle as needed for anaphylaxis. 01/25/21  Yes [name], Jonna Coup, MD  Evolocumab (REPATHA SURECLICK) 140 MG/ML SOAJ Inject 140 mg into the skin every 14 (fourteen) days. 06/29/22  Yes Alver Sorrow, NP  fentaNYL (DURAGESIC) 25 MCG/HR Place 1 patch onto the skin every 3 (three) days.   Yes [provider]  gabapentin (NEURONTIN) 100 MG capsule Take 100 mg by mouth at bedtime.   Yes [provider]  HYDROmorphone (DILAUDID) 2 MG  tablet Take 2 mg by mouth every 6 (six) hours as needed for severe pain.   Yes [provider]  insulin lispro (HUMALOG KWIKPEN) 100 UNIT/ML KwikPen Inject 0-15 Units into the skin 3 (three) times daily. BS 0-120 inject 0 units BS 121-150 inject 2 units  BS 151-200 inject 3 units  BS 201-250 inject 5 units  BS 251-300 inject 8 units  BS 301-350 inject 11 units  BS 351-400 inject 15 units BS 401-999 inject 17 units and call provider   Yes [provider]  levothyroxine (SYNTHROID) 25 MCG tablet Take 25 mcg by mouth daily.   Yes [provider]  metoprolol tartrate (LOPRESSOR) 25 MG tablet Take 1 tablet (25 mg total) by mouth 2 (two) times daily. In addition you can take a 3rd dose of metoprolol as needed for heart rate > 120 Patient taking differently: Take 25 mg by mouth 2 (two) times daily. 09/22/22  Yes Gwyneth Sprout, MD  metoprolol tartrate (LOPRESSOR) 25 MG tablet Take 25 mg by mouth daily as needed (for heart rate > 120).   Yes [provider]  ondansetron (ZOFRAN) 4 MG/2ML SOLN injection Inject 4 mg into the vein daily. PRN  order: inject 4 mg every 8 hours as needed for nausea and vomiting   Yes [provider]  OXYGEN Inhale 2 L into the lungs continuous.   Yes [provider]  pantoprazole (PROTONIX) 40 MG tablet Take 1 tablet (40 mg total) by mouth 2 (two) times daily. 05/30/22  Yes Tat, Onalee Hua, MD  polyethylene glycol (MIRALAX) 17 g packet Take 17 g by mouth daily. 11/29/22  Yes Mabe, [name], MD  sertraline (ZOLOFT) 100 MG tablet Take 1 tablet (100 mg total) by mouth daily. 11/29/22  Yes Mabe, [name], MD  UNABLE TO FIND Take 1 Dose by mouth in the morning and at bedtime. Med Name: Med plus 2.0   Yes [provider]   Current Facility-Administered Medications  Medication Dose Route Frequency Provider Last Rate Last Admin   acetaminophen (TYLENOL) tablet 650 mg  650 mg Oral Q6H PRN Synetta Fail, MD       Or   acetaminophen (TYLENOL) suppository 650 mg  650 mg Rectal Q6H PRN Synetta Fail, MD       azithromycin [facility]) 500 mg in sodium chloride 0.9 % 250 mL IVPB  500 mg Intravenous Once Synetta Fail, MD       cefTRIAXone (ROCEPHIN) 1 g in sodium chloride 0.9 % 100 mL IVPB  1 g Intravenous Once Synetta Fail, MD       heparin injection 5,000 Units  5,000 Units Subcutaneous Q8H Synetta Fail, MD       polyethylene glycol (MIRALAX / GLYCOLAX) packet 17 g  17 g Oral Daily PRN Synetta Fail, MD       sodium chloride flush (NS) 0.9 % injection 3 mL  3 mL Intravenous Q12H Synetta Fail, MD       Current Outpatient Medications  Medication Sig Dispense Refill   amiodarone (PACERONE) 200 MG tablet Take 1 tablet (200 mg total) by mouth daily. 90 tablet 3   calcium acetate (PHOSLO) 667 MG capsule Take 667 mg by mouth See admin instructions. Take 667 mg by mouth twice daily on Monday, Wednesday, and Friday     Calcium Acetate 667 MG TABS Take 667 mg by mouth See admin instructions. Take 667 mg by mouth three times daily on Tuesday, Thursday, Saturday, and  Sunday     doxazosin (CARDURA) 2 MG tablet TAKE 1 TABLET BY MOUTH DAILY 30 tablet 5   EPINEPHrine 0.3 mg/0.3 mL IJ SOAJ injection Inject 0.3 mg into the muscle as needed for anaphylaxis. 1 each 4   Evolocumab (REPATHA SURECLICK) 140 MG/ML SOAJ Inject 140 mg into the skin every 14 (fourteen) days. 6 mL 1   fentaNYL (DURAGESIC) 25 MCG/HR Place 1 patch onto the skin every 3 (three) days.     gabapentin (NEURONTIN) 100 MG capsule Take 100 mg by mouth at bedtime.     HYDROmorphone (DILAUDID) 2 MG tablet Take 2 mg by mouth every 6 (six) hours as needed for severe pain.     insulin lispro (HUMALOG KWIKPEN) 100 UNIT/ML KwikPen Inject 0-15 Units into the skin 3 (three) times daily. BS 0-120 inject 0 units BS 121-150 inject 2 units  BS 151-200 inject 3 units  BS 201-250 inject 5 units  BS 251-300 inject 8 units  BS 301-350 inject 11 units  BS 351-400 inject 15 units BS 401-999 inject 17 units and call provider     levothyroxine (SYNTHROID) 25 MCG tablet Take 25 mcg by mouth daily.     metoprolol tartrate (LOPRESSOR) 25 MG tablet Take 1 tablet (25 mg total) by mouth 2 (two) times daily. In addition you can take a 3rd dose of metoprolol as needed for heart rate > 120 (Patient taking differently: Take 25 mg by mouth 2 (two) times daily.) 60 tablet 1   metoprolol tartrate (LOPRESSOR) 25 MG tablet Take 25 mg by mouth daily as needed (for heart rate > 120).     ondansetron (ZOFRAN) 4 MG/2ML SOLN injection Inject 4 mg into the vein daily. PRN order: inject 4 mg every 8 hours as needed for nausea and vomiting     OXYGEN Inhale 2 L into the lungs continuous.     pantoprazole (PROTONIX) 40 MG tablet Take 1 tablet (40 mg total) by mouth 2 (two) times daily. 60 tablet 1   polyethylene glycol (MIRALAX) 17 g packet Take 17 g by mouth daily. 14 each 0   sertraline (ZOLOFT) 100 MG tablet Take 1 tablet (100 mg total) by mouth daily. 30 tablet 0   UNABLE TO FIND Take 1 Dose by mouth in the morning and at bedtime. Med  Name: Med plus 2.0     Labs: Basic Metabolic Panel: Recent Labs  Lab 02/13/23 1129  NA 134*  K 3.6  CL 93*  CO2 28  GLUCOSE 155*  BUN 31*  CREATININE 5.84*  CALCIUM 8.3*   Liver Function Tests: Recent Labs  Lab 02/13/23 1129  AST 9*  ALT 6  ALKPHOS 61  BILITOT 1.0  PROT 6.0*  ALBUMIN 2.5*   Recent Labs  Lab 02/13/23 1129  LIPASE 31   No results for input(s): "AMMONIA" in the last 168 hours. CBC: Recent Labs  Lab 02/13/23 1129  WBC 5.7  NEUTROABS 4.5  HGB 9.3*  HCT 32.0*  MCV 81.6  PLT 203   Cardiac Enzymes: No results for input(s): "CKTOTAL", "CKMB", "CKMBINDEX", "TROPONINI" in the last 168 hours. CBG: No results for input(s): "GLUCAP" in the last 168 hours. Iron Studies: No results for input(s): "IRON", "TIBC", "TRANSFERRIN", "FERRITIN" in the last 72 hours. Studies/Results: CT HEAD WO CONTRAST ( )  Result Date: 02/13/2023 CLINICAL DATA:  67 year old male with history of altered mental status and somnolence. EXAM: CT HEAD WITHOUT CONTRAST TECHNIQUE: Contiguous axial images were obtained from the base of the skull through  the vertex without intravenous contrast. RADIATION DOSE REDUCTION: This exam was performed according to the departmental dose-optimization program which includes automated exposure control, adjustment of the mA and/or kV according to patient size and/or use of iterative reconstruction technique. COMPARISON:  Head CT 09/09/2022. FINDINGS: Brain: Moderate cerebral and mild cerebellar atrophy, advanced for age. Patchy and confluent areas of decreased attenuation are noted throughout the deep and periventricular white matter of the cerebral hemispheres bilaterally, compatible with chronic microvascular ischemic disease. No evidence of acute infarction, hemorrhage, hydrocephalus, extra-axial collection or mass lesion/mass effect. Vascular: No hyperdense vessel or unexpected calcification. Skull: Normal. Negative for fracture or focal lesion.  Sinuses/Orbits: No acute finding. Other: None. IMPRESSION: 1. No acute intracranial abnormalities. 2. Moderate cerebral and mild cerebellar atrophy with extensive chronic microvascular ischemic changes in the cerebral white matter, as above. Electronically Signed   By: Trudie Reed M.D.   On: 02/13/2023 12:41   DG Chest Portable 1 View  Result Date: 02/13/2023 CLINICAL DATA:  Fever and cough.  Shortness of breath.  Fatigue. EXAM: PORTABLE CHEST 1 VIEW COMPARISON:  11/25/2022 FINDINGS: Stable mild cardiomegaly.  Prior CABG again noted. Low lung volumes again demonstrated. Diffuse interstitial infiltrates are likely due to interstitial edema. New small right pleural effusion also noted. IMPRESSION: Diffuse interstitial infiltrates, likely due to interstitial edema/congestive heart failure. New small right pleural effusion. Electronically Signed   By: Danae Orleans M.D.   On: 02/13/2023 12:36    OP HDL [place] MWF 180NRe EDW 80kg BFR 400 DFR 600 2K 2.5Ca  AVF 15g  Heparin 5000 unit bolus Mircera IV q 2 weeks- last dose 02/03/23 Venofer 100mg  IV q HD- not started yet  Assessment/Plan:  Acute encephalopathy/fever: suspected PNA. Patient with COVID 19 about 2 weeks ago but worsening fatigue/somnolence over the past two days. Infection work up in progress per admitting team  ESRD:  On MWF schedule, went to HD Friday but missed Wednesday. Will arrange for HD today, 3K bath.   Hypertension/volume: BP controlled but edema on CXR. He has been losing weight and likely needs his EDW lowered further. UF goal 2.5L with HD today as tolerated  Anemia: Hgb 9.3, recently received ESA. IV iron was ordered outpatient but will hold for now d/t possible infection  Metabolic bone disease: Calcium controlled. Will check phos with next labs. Resume binders once tolerating PO  Nutrition:  Poor PO intake lately, low albumin, with recent hospitalization and COVID infection. Start protein supplements once tolerating  PO Hx SVT: Hospitalized in June 2024. On amiodarone and metoprolol. Rate currently controlled  Rogers Blocker, PA-C 02/13/2023, 3:23 PM  [facility] Pager: [phone]  Pt seen, examined and agree w A/P as above.  Vinson Moselle MD  [facility] 02/13/2023, 4:53 PM

## 2023-02-13 NOTE — H&P (Signed)
History and Physical   [name] ZOX:[id number] DOB: [birth year] DOA: 02/13/2023  PCP: Babs Sciara, MD   Patient coming from: [facility] SN[facility]  Chief Complaint: AMS, Fever  HPI: [name] is a 67 y.o. male with medical history significant of hypertension, hyperlipidemia, diabetes, ESRD on HD, SVT, PAD, CAD status post CABG, GERD, esophageal adenocarcinoma status post surgical intervention, dysphagia, chronic combined systolic and diastolic CHF, pulmonary embolism, depression, OSA, CVA presenting with altered mental status and fatigue.  History obtained with assistance of chart review and family due to patient's altered mental status.  Patient resides at a skilled nursing facility.  He was seen in the hospital in June for SVT.  He reportedly tested positive for COVID 19 2 weeks ago.    Family states they were with him yesterday and he seemed to have some increased fatigue and decreased p.o. intake and was somewhat warm to the touch but no measured fever.  Today staff evaluated him and found him significantly fatigued and more somnolent and so much so was unwilling to get up to try to go to dialysis.  Noted to have a fever of 102 sent to the ED for further evaluation.  Has reportedly had some issues with UTIs and aspiration pneumonia as well.  Alert and oriented when seen.  Denies chills, chest pain, abdominal pain, constipation, diarrhea, nausea, vomiting.  ED Course: Vital signs in the ED notable for patient remaining on chronic 4 L supplemental oxygen.  Lab workup included CMP with sodium 134, chloride 93, BUN 31, creatinine elevated to 5.89Consistent with ESRD, glucose 155, calcium 8.3, protein 6.0, albumin 2.5.  CBC with hemoglobin 9.3 down from baseline at 10.52 months ago.  Lactic acid normal with repeat pending.  Lipase normal.  BNP elevated to 3500.  TSH normal.  Respiratory panel for flu COVID RSV pending.  Ammonia level pending.  Urinalysis with protein, rare  bacteria only.  Urine culture pending.  Chest x-ray showed diffuse interstitial infiltrates suspicious for edema/CHF.  CT head showed no acute normality.  Patient received ceftriaxone and azithromycin in the ED.  Nephrology consulted for dialysis while admitted.  Review of Systems: As per HPI otherwise all other systems reviewed and are negative.  Past Medical History:  Diagnosis Date   Acute blood loss anemia 03/20/2021   Acute metabolic encephalopathy 03/03/2022   Acute on chronic respiratory failure with hypoxia (HCC) 02/28/2022   Anemia    Arthritis    hips shoulders   Atypical chest pain 01/30/2022   CAD (coronary artery disease) 03/03/2019   Cancer (HCC)    Esophageal cancer 2022   CHF (congestive heart failure) (HCC) 2020   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   Admitted 11/14-11/20/19-diuresed 10L   Complete rotator cuff tear 08/26/2020   Depression 05/31/2021   Fatigue 10/14/2020   gram neg rod Bacteremia 03/01/2022   Hip fracture (HCC) 09/09/2022   Hypertension    Myocardial infarction (HCC)    Sleep apnea    uses a bipap machine   Stroke (HCC) 12/14/2020   no last weakness or paralysis   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    Past Surgical History:  Procedure Laterality Date   AV FISTULA PLACEMENT Left 12/21/2021   Procedure: LEFT ARM ARTERIOVENOUS (AV) FISTULA CREATION;  Surgeon: Larina Earthly, MD;  Location: [facility];  Service: Vascular;  Laterality: Left;   BIOPSY  06/27/2019   Procedure: BIOPSY;  Surgeon: Malissa Hippo, MD;  Location: [facility];  Service: Endoscopy;;  ascending colon polyp   BIOPSY  10/21/2020   Procedure: BIOPSY;  Surgeon: Malissa Hippo, MD;  [facility]: [facility];  Service: Endoscopy;;  esophageal,gastric polyp   BIOPSY  02/24/2021   Procedure: BIOPSY;  Surgeon: Malissa Hippo, MD;  [facility]: [facility];  Service: Endoscopy;;   distal and proximal esophageal biopsies    BIOPSY  05/30/2022   Procedure: BIOPSY;  Surgeon: Lanelle Bal, DO;  [facility];  Service: Endoscopy;;   CARDIAC SURGERY     CATARACT EXTRACTION     COLONOSCOPY N/A 06/27/2019   [name]:Diverticulosis in the entire examined colon. tubular adenoma in ascending colon, 4mm tubular adenoma in prox sigmoid. external hemorrhoids   CORONARY ARTERY BYPASS GRAFT N/A 03/25/2019   Procedure: CORONARY ARTERY BYPASS GRAFTING (CABG) X  4 USING LEFT INTERNAL MAMMARY ARTERY AND RIGHT SAPHENOUS VEIN GRAFTS;  Surgeon: Corliss Skains, MD;  Location: [facility] OR;  Service: Open Heart Surgery;  Laterality: N/A;   ESOPHAGEAL BRUSHING  03/08/2022   Procedure: ESOPHAGEAL BRUSHING;  Surgeon: Lanelle Bal, DO;  [facility];  Service: Endoscopy;;   ESOPHAGOGASTRODUODENOSCOPY (EGD) WITH PROPOFOL N/A 10/21/2020   rehman:Normal hypopharynx.Normal proximal esophagus and mid esophagus.Esophageal mucosal changes consistent with long-segment Barrett's esophagus. (Focal low-grade dysplasia and atypia proximally) z line irregular 37 cm from incisors, 3 cm HH, single gastric polyp (fundic gland) normal duodenal bulb/second portion of duodenum, proximal margin of Barrett's at 34 cm   ESOPHAGOGASTRODUODENOSCOPY (EGD) WITH PROPOFOL N/A 02/24/2021   Procedure: ESOPHAGOGASTRODUODENOSCOPY (EGD) WITH PROPOFOL;  Surgeon: Malissa Hippo, MD;  [facility];  Service: Endoscopy;  Laterality: N/A;  1:35   ESOPHAGOGASTRODUODENOSCOPY (EGD) WITH PROPOFOL N/A 03/21/2021   Procedure: ESOPHAGOGASTRODUODENOSCOPY (EGD) WITH PROPOFOL;  Surgeon: Jeani Hawking, MD;  Location: [facility] ENDOSCOPY;  Service: Endoscopy;  Laterality: N/A;   ESOPHAGOGASTRODUODENOSCOPY (EGD) WITH PROPOFOL N/A 10/23/2021   Procedure: ESOPHAGOGASTRODUODENOSCOPY (EGD) WITH PROPOFOL;  Surgeon: Sherrilyn Rist, MD;  Location: [facility] ENDOSCOPY;  Service: Gastroenterology;  Laterality: N/A;    ESOPHAGOGASTRODUODENOSCOPY (EGD) WITH PROPOFOL N/A 03/08/2022   Procedure: ESOPHAGOGASTRODUODENOSCOPY (EGD) WITH PROPOFOL;  Surgeon: Lanelle Bal, DO;  [facility];  Service: Endoscopy;  Laterality: N/A;   ESOPHAGOGASTRODUODENOSCOPY (EGD) WITH PROPOFOL N/A 05/27/2022   Procedure: ESOPHAGOGASTRODUODENOSCOPY (EGD) WITH PROPOFOL;  Surgeon: Dolores Frame, MD;  [facility];  Service: Gastroenterology;  Laterality: N/A;   ESOPHAGOGASTRODUODENOSCOPY (EGD) WITH PROPOFOL N/A 05/30/2022   Procedure: ESOPHAGOGASTRODUODENOSCOPY (EGD) WITH PROPOFOL;  Surgeon: Lanelle Bal, DO;  [facility];  Service: Endoscopy;  Laterality: N/A;   EXCISION MORTON'S NEUROMA     EYE SURGERY Left    retina   INCISION AND DRAINAGE OF WOUND Right 06/28/2021   Procedure: IRRIGATION AND DEBRIDEMENT WOUND;  Surgeon: Allena Napoleon, MD;  Location: [facility] OR;  Service: Plastics;  Laterality: Right;  1.5 hour   INSERTION OF DIALYSIS CATHETER Right 12/21/2021   Procedure: INSERTION OF TUNNELED DIALYSIS CATHETER;  Surgeon: Larina Earthly, MD;  Location: [facility];  Service: Vascular;  Laterality: Right;   INTRAMEDULLARY (IM) NAIL INTERTROCHANTERIC Right 09/10/2022   Procedure: INTRAMEDULLARY (IM) NAIL INTERTROCHANTERIC;  Surgeon: Joen Laura, MD;  [facility]: [facility];  Service: Orthopedics;  Laterality: Right;   LEFT HEART CATH AND CORS/GRAFTS ANGIOGRAPHY N/A 02/01/2022   Procedure: LEFT HEART CATH AND CORS/GRAFTS ANGIOGRAPHY;  Surgeon: [place],  Demetria Pore, MD;  Location: [facility] INVASIVE CV LAB;  Service: Cardiovascular;  Laterality: N/A;   POLYPECTOMY  06/27/2019   Procedure: POLYPECTOMY;  Surgeon: Malissa Hippo, MD;  [facility];  Service: Endoscopy;;  proximal sigmoid colon   RIGHT/LEFT HEART CATH AND CORONARY ANGIOGRAPHY N/A 03/12/2019   Procedure: RIGHT/LEFT HEART CATH AND CORONARY ANGIOGRAPHY;  Surgeon: Corky Crafts, MD;  Location: [facility] INVASIVE CV LAB;  Service:  Cardiovascular;  Laterality: N/A;   SHOULDER ARTHROSCOPY WITH ROTATOR CUFF REPAIR AND SUBACROMIAL DECOMPRESSION Right 08/26/2020   Procedure: RIGHT SHOULDER MINI OPEN ROTATOR CUFF REPAIR AND SUBACROMIAL DECOMPRESSION WITH PATCH GRAFT;  Surgeon: Jene Every, MD;  Location: [facility] ORS;  Service: Orthopedics;  Laterality: Right;  90 MINS GENERAL WITH BLOCK   SKIN SPLIT GRAFT Right 06/28/2021   Procedure: SKIN GRAFT SPLIT THICKNESS;  Surgeon: Allena Napoleon, MD;  Location: [facility];  Service: Plastics;  Laterality: Right;   TEE WITHOUT CARDIOVERSION N/A 03/25/2019   Procedure: TRANSESOPHAGEAL ECHOCARDIOGRAM (TEE);  Surgeon: Corliss Skains, MD;  Location: [facility] OR;  Service: Open Heart Surgery;  Laterality: N/A;    Social History  reports that he quit smoking about 34 years ago. His smoking use included cigarettes. He has been exposed to tobacco smoke. He has never used smokeless tobacco. He reports that he does not currently use alcohol after a past usage of about 2.0 standard drinks of alcohol per week. He reports that he does not use drugs.  Allergies  Allergen Reactions   Bee Venom Anaphylaxis   Atorvastatin Other (See Comments)    myalgia   Diltiazem Itching   Lopressor [Metoprolol]     NIGHTMARES. Pt tolerating this now 11/25/22   Oxycodone Other (See Comments)    Reaction not listed on MAR    Reglan [Metoclopramide] Other (See Comments)    Suicidal    Rosuvastatin Other (See Comments)    myalgias   Valsartan Itching    Family History  Problem Relation Age of Onset   Colon cancer Mother    Heart Problems Father    Diabetes Father    Valvular heart disease Father    Sleep apnea Neg Hx    Stroke Neg Hx   Reviewed on admission  Prior to Admission medications   Medication Sig Start Date End Date Taking? Authorizing Provider  amiodarone (PACERONE) 200 MG tablet Take 1 tablet (200 mg total) by mouth daily. 12/27/22  Yes Marinus Maw, MD  calcium acetate (PHOSLO) 667 MG capsule  Take 667 mg by mouth See admin instructions. Take 667 mg by mouth twice daily on Monday, Wednesday, and Friday   Yes [provider]  Calcium Acetate 667 MG TABS Take 667 mg by mouth See admin instructions. Take 667 mg by mouth three times daily on Tuesday, Thursday, Saturday, and Sunday   Yes [provider]  doxazosin (CARDURA) 2 MG tablet TAKE 1 TABLET BY MOUTH DAILY 07/29/22  Yes [name], Scott A, MD  EPINEPHrine 0.3 mg/0.3 mL IJ SOAJ injection Inject 0.3 mg into the muscle as needed for anaphylaxis. 01/25/21  Yes [name], Jonna Coup, MD  Evolocumab (REPATHA SURECLICK) 140 MG/ML SOAJ Inject 140 mg into the skin every 14 (fourteen) days. 06/29/22  Yes Alver Sorrow, NP  fentaNYL (DURAGESIC) 25 MCG/HR Place 1 patch onto the skin every 3 (three) days.   Yes [provider]  gabapentin (NEURONTIN) 100 MG capsule Take 100 mg by mouth at bedtime.   Yes [provider]  HYDROmorphone (DILAUDID) 2 MG tablet Take 2 mg by mouth every 6 (six) hours as needed for severe pain.   Yes [provider]  insulin lispro (HUMALOG KWIKPEN) 100 UNIT/ML KwikPen Inject 0-15 Units into the skin 3 (three) times daily. BS 0-120 inject 0 units BS 121-150 inject 2 units  BS 151-200 inject 3 units  BS 201-250 inject 5 units  BS 251-300 inject 8 units  BS 301-350 inject 11 units  BS 351-400 inject 15 units BS 401-999 inject 17 units and call provider   Yes [provider]  levothyroxine (SYNTHROID) 25 MCG tablet Take 25 mcg by mouth daily.   Yes [provider]  metoprolol tartrate (LOPRESSOR) 25 MG tablet Take 1 tablet (25 mg total) by mouth 2 (two) times daily. In addition you can take a 3rd dose of metoprolol as needed for heart rate > 120 Patient taking differently: Take 25 mg by mouth 2 (two) times daily. 09/22/22  Yes Gwyneth Sprout, MD  metoprolol tartrate (LOPRESSOR) 25 MG tablet Take 25 mg by mouth daily as needed (for heart rate > 120).   Yes [provider]  ondansetron (ZOFRAN) 4 MG/2ML SOLN injection Inject 4 mg into the vein daily. PRN order: inject 4 mg every 8 hours as needed for nausea and vomiting   Yes [provider]  OXYGEN Inhale 2 L into the lungs continuous.   Yes [provider]  pantoprazole (PROTONIX) 40 MG tablet Take 1 tablet (40 mg total) by mouth 2 (two) times daily. 05/30/22  Yes Tat, Onalee Hua, MD  polyethylene glycol (MIRALAX) 17 g packet Take 17 g by mouth daily. 11/29/22  Yes Mabe, [name], MD  sertraline (ZOLOFT) 100 MG tablet Take 1 tablet (100 mg total) by mouth daily. 11/29/22  Yes Mabe, [name], MD  UNABLE TO FIND Take 1 Dose by mouth in the morning and at bedtime. Med Name: Med plus 2.0   Yes [provider]  acetaminophen (TYLENOL) 325 MG tablet Take 2 tablets (650 mg total) by mouth every 6 (six) hours as needed for mild pain, moderate pain or fever. Patient not taking: Reported on 12/27/2022 08/02/21   Drema Dallas, MD  albuterol (PROVENTIL) (2.5 MG/3ML) 0.083% nebulizer solution Inhale 3 mLs into the lungs every 6 (six) hours as needed for wheezing or shortness of breath. 11/29/22   Evette Georges, MD  aspirin EC 325 MG tablet 325 mg every 4 (four) hours as needed. 11/04/22   [provider]  bisacodyl (DULCOLAX) 10 MG suppository Place 1 suppository (10 mg total) rectally daily as needed for moderate constipation. 09/17/22   Jerald Kief, MD  insulin glargine (LANTUS SOLOSTAR) 100 UNIT/ML Solostar Pen daily. 09/07/22   [provider]  methocarbamol (ROBAXIN) 500 MG tablet Take 1 tablet (500 mg total) by mouth every 8 (eight) hours as needed for muscle spasms. Patient taking differently: Take 500 mg by mouth daily. 09/17/22   Jerald Kief, MD  nitroGLYCERIN (NITROSTAT) 0.4 MG SL tablet Place 1 tablet (0.4 mg total) under the tongue every 5 (five) minutes as needed for chest pain. 09/14/21   Alver Sorrow, NP  Nutritional Supplements (NUTRITIONAL SUPPLEMENT PO) Take 1  Can by mouth in the morning and at bedtime.    [provider]  trimethobenzamide Dorothey Baseman) 300 MG capsule Take 1 capsule (300 mg total) by mouth daily as needed for nausea/vomiting. 11/29/22   Evette Georges, MD    Physical Exam: Vitals:   02/13/23 1300 02/13/23  1330 02/13/23 1400 02/13/23 1430  BP: 98/68 122/62 132/67 (!) 110/47  Pulse: 66 67 66 63  Resp: 12 14 11 11   Temp:      TempSrc:      SpO2: 100% 100% 100% 100%  Weight:      Height:        Physical Exam Constitutional:      General: He is not in acute distress.    Appearance: Normal appearance. He is ill-appearing.  HENT:     Head: Normocephalic and atraumatic.     Mouth/Throat:     Mouth: Mucous membranes are moist.     Pharynx: Oropharynx is clear.  Eyes:     Extraocular Movements: Extraocular movements intact.     Pupils: Pupils are equal, round, and reactive to light.  Cardiovascular:     Rate and Rhythm: Normal rate and regular rhythm.     Pulses: Normal pulses.     Heart sounds: Normal heart sounds.  Pulmonary:     Effort: Pulmonary effort is normal. No respiratory distress.     Breath sounds: Rales present.  Abdominal:     General: Bowel sounds are normal. There is no distension.     Palpations: Abdomen is soft.     Tenderness: There is no abdominal tenderness.  Musculoskeletal:        General: No swelling or deformity.  Skin:    General: Skin is warm and dry.  Neurological:     General: No focal deficit present.     Mental Status: Mental status is at baseline.    Labs on Admission: I have personally reviewed following labs and imaging studies  CBC: Recent Labs  Lab 02/13/23 1129  WBC 5.7  NEUTROABS 4.5  HGB 9.3*  HCT 32.0*  MCV 81.6  PLT 203    Basic Metabolic Panel: Recent Labs  Lab 02/13/23 1129  NA 134*  K 3.6  CL 93*  CO2 28  GLUCOSE 155*  BUN 31*  CREATININE 5.84*  CALCIUM 8.3*    GFR: Estimated Creatinine Clearance: 12.8 mL/min (A) (by C-G formula based on SCr of  5.84 mg/dL (H)).  Liver Function Tests: Recent Labs  Lab 02/13/23 1129  AST 9*  ALT 6  ALKPHOS 61  BILITOT 1.0  PROT 6.0*  ALBUMIN 2.5*    Urine analysis:    Component Value Date/Time   COLORURINE AMBER (A) 02/13/2023 1356   APPEARANCEUR CLEAR 02/13/2023 1356   LABSPEC 1.015 02/13/2023 1356   PHURINE 7.0 02/13/2023 1356   GLUCOSEU NEGATIVE 02/13/2023 1356   HGBUR NEGATIVE 02/13/2023 1356   BILIRUBINUR NEGATIVE 02/13/2023 1356   KETONESUR NEGATIVE 02/13/2023 1356   PROTEINUR >=300 (A) 02/13/2023 1356   NITRITE NEGATIVE 02/13/2023 1356   LEUKOCYTESUR NEGATIVE 02/13/2023 1356    Radiological Exams on Admission: CT HEAD WO CONTRAST ( )  Result Date: 02/13/2023 CLINICAL DATA:  67 year old male with history of altered mental status and somnolence. EXAM: CT HEAD WITHOUT CONTRAST TECHNIQUE: Contiguous axial images were obtained from the base of the skull through the vertex without intravenous contrast. RADIATION DOSE REDUCTION: This exam was performed according to the departmental dose-optimization program which includes automated exposure control, adjustment of the mA and/or kV according to patient size and/or use of iterative reconstruction technique. COMPARISON:  Head CT 09/09/2022. FINDINGS: Brain: Moderate cerebral and mild cerebellar atrophy, advanced for age. Patchy and confluent areas of decreased attenuation are noted throughout the deep and periventricular white matter of the cerebral hemispheres bilaterally, compatible with chronic  microvascular ischemic disease. No evidence of acute infarction, hemorrhage, hydrocephalus, extra-axial collection or mass lesion/mass effect. Vascular: No hyperdense vessel or unexpected calcification. Skull: Normal. Negative for fracture or focal lesion. Sinuses/Orbits: No acute finding. Other: None. IMPRESSION: 1. No acute intracranial abnormalities. 2. Moderate cerebral and mild cerebellar atrophy with extensive chronic microvascular ischemic  changes in the cerebral white matter, as above. Electronically Signed   By: Trudie Reed M.D.   On: 02/13/2023 12:41   DG Chest Portable 1 View  Result Date: 02/13/2023 CLINICAL DATA:  Fever and cough.  Shortness of breath.  Fatigue. EXAM: PORTABLE CHEST 1 VIEW COMPARISON:  11/25/2022 FINDINGS: Stable mild cardiomegaly.  Prior CABG again noted. Low lung volumes again demonstrated. Diffuse interstitial infiltrates are likely due to interstitial edema. New small right pleural effusion also noted. IMPRESSION: Diffuse interstitial infiltrates, likely due to interstitial edema/congestive heart failure. New small right pleural effusion. Electronically Signed   By: Danae Orleans M.D.   On: 02/13/2023 12:36    EKG: Ordered in ED but not yet available for review.  Assessment/Plan Active Problems:   SVT (supraventricular tachycardia)   Chronic combined systolic and diastolic CHF (congestive heart failure) (HCC)   HTN (hypertension)   Type 2 diabetes mellitus (HCC)   Dysphagia   Coronary artery disease involving coronary bypass graft of native heart with other forms of angina pectoris (HCC)   Obstructive sleep apnea   S/P CABG (coronary artery bypass graft)   Peripheral arterial disease (HCC)   GERD (gastroesophageal reflux disease)   Hyperlipidemia associated with type 2 diabetes mellitus (HCC)   Anemia due to chronic kidney disease   ESRD (end stage renal disease) on dialysis (HCC)   Depression   Esophageal adenocarcinoma (HCC)   Acute encephalopathy COVID-19 infection ?Superimposed bacterial pneumonia > Patient presenting with fatigue, somnolence, fever measured to 102 at facility. > Reportedly does a positive for COVID first 2 weeks ago and yesterday had some increased fatigue and decreased p.o. intake.  But significant fever developed today along with worsening fatigue and somnolence. > Is ESRD but no profound uremia nor electrolyte disturbance. > Urinalysis with protein and rare bacteria  only.  Chest x-ray more suspicious for edema than pneumonia but hard to rule out. > Notably does not have leukocytosis making bacterial infection less likely. > However, given primary respiratory symptoms and COVID infection being 2 weeks out patient has been covered with ceftriaxone and azithromycin in the ED for possible superimposed pneumonia. - Monitor in progressive unit - Hold off on repeat antibiotic dose and pending further evaluation - Check procalcitonin - Follow-up respiratory viral panel for flu COVID and RSV - Trend fever curve and WBC - Follow-up urine cultures, blood cultures - Supportive care  Acute on chronic combined systolic and diastolic CHF Volume overload ESRD > Patient with some shortness of breath possible component of pneumonia above.  However did not have dialysis session today due to his fatigue and somnolence. > Chest x-ray more consistent with edema/CHF/volume than pneumonia as above. > BNP is elevated to greater than 3500. > Volume is managed with dialysis.  Nephrology consulted by EDP for dialysis while admitted. - Monitoring on progressive as above - Dialysis per nephrology, appreciate recommendations and assistance - Continue home metoprolol  Hypertension - Continue home metoprolol  Hyperlipidemia - Is on Repatha outpatient  CAD PAD History of CVA > Status post CABG - Continue home metoprolol, ASA - On Repatha outpatient as above  History of SVT - Continue home amiodarone, metoprolol  GERD History of esophageal adenocarcinoma Dysphagia > Status post surgical intervention.  Follows with [facility]. - Continue PPI - Continue dysphagia 2 diet  Depression - Continue home sertraline  OSA - No longer uses CPAP per chart  DVT prophylaxis: Heparin Code Status:   DNR, okay with trial of intubation Family Communication:  None on admission.  Sister was updated by EDP. Disposition Plan:   Patient is from:  SNF, [facility]  Anticipated DC to:  Same as above  Anticipated DC date:  1 to 5 days  Anticipated DC barriers: None  Consults called:  None Admission status:  Observation, progressive  Severity of Illness: The appropriate patient status for this patient is OBSERVATION. Observation status is judged to be reasonable and necessary in order to provide the required intensity of service to ensure the patient's safety. The patient's presenting symptoms, physical exam findings, and initial radiographic and laboratory data in the context of their medical condition is felt to place them at decreased risk for further clinical deterioration. Furthermore, it is anticipated that the patient will be medically stable for discharge from the hospital within 2 midnights of admission.    Synetta Fail MD [facility]  How to contact the [facility] Attending or Consulting provider 7A - 7P or covering provider during after hours 7P -7A, for this patient?   Check the care team in [facility] and look for a) attending/consulting [facility] provider listed and b) the [facility] team listed Log into [email or web address] and use [place]'s universal password to access. If you do not have the password, please contact the hospital operator. Locate the [facility] provider you are looking for under [facility] and page to a number that you can be directly reached. If you still have difficulty reaching the provider, please page the [facility] (Director on Call) for the Hospitalists listed on amion for assistance.  02/13/2023, 2:55 PM

## 2023-02-13 NOTE — Plan of Care (Signed)

## 2023-02-13 NOTE — ED Triage Notes (Signed)
PT BIB EMS from [facility] health center,SNF, for general weakness and AMS. PT was recently covid+ and presented to EMS with fever of 10.74F. EMS gave 650mg  tylenol. Hs DM and dialysis (MWF).   EMS 150/68 80 HR 97% 4lpm [place] 154 BG 101.21F temp- temporal

## 2023-02-14 ENCOUNTER — Observation Stay (HOSPITAL_BASED_OUTPATIENT_CLINIC_OR_DEPARTMENT_OTHER): Payer: Medicare Other

## 2023-02-14 ENCOUNTER — Observation Stay (HOSPITAL_COMMUNITY): Payer: Medicare Other

## 2023-02-14 ENCOUNTER — Encounter (HOSPITAL_COMMUNITY): Payer: Self-pay | Admitting: Internal Medicine

## 2023-02-14 DIAGNOSIS — I5021 Acute systolic (congestive) heart failure: Secondary | ICD-10-CM

## 2023-02-14 DIAGNOSIS — N186 End stage renal disease: Secondary | ICD-10-CM | POA: Diagnosis not present

## 2023-02-14 DIAGNOSIS — E1122 Type 2 diabetes mellitus with diabetic chronic kidney disease: Secondary | ICD-10-CM | POA: Diagnosis not present

## 2023-02-14 DIAGNOSIS — I471 Supraventricular tachycardia, unspecified: Secondary | ICD-10-CM | POA: Diagnosis not present

## 2023-02-14 DIAGNOSIS — G9340 Encephalopathy, unspecified: Secondary | ICD-10-CM | POA: Diagnosis not present

## 2023-02-14 DIAGNOSIS — U071 COVID-19: Secondary | ICD-10-CM | POA: Diagnosis not present

## 2023-02-14 DIAGNOSIS — I5042 Chronic combined systolic (congestive) and diastolic (congestive) heart failure: Secondary | ICD-10-CM | POA: Diagnosis not present

## 2023-02-14 DIAGNOSIS — I10 Essential (primary) hypertension: Secondary | ICD-10-CM | POA: Diagnosis not present

## 2023-02-14 DIAGNOSIS — G9341 Metabolic encephalopathy: Secondary | ICD-10-CM | POA: Diagnosis not present

## 2023-02-14 LAB — ECHOCARDIOGRAM LIMITED
Height: 70 in
S' Lateral: 4.86 cm
Weight: 2878.33 [oz_av]

## 2023-02-14 LAB — CBC
HCT: 33.5 % — ABNORMAL LOW (ref 39.0–52.0)
Hemoglobin: 10 g/dL — ABNORMAL LOW (ref 13.0–17.0)
MCH: 23.5 pg — ABNORMAL LOW (ref 26.0–34.0)
MCHC: 29.9 g/dL — ABNORMAL LOW (ref 30.0–36.0)
MCV: 78.8 fL — ABNORMAL LOW (ref 80.0–100.0)
Platelets: 181 10*3/uL (ref 150–400)
RBC: 4.25 MIL/uL (ref 4.22–5.81)
RDW: 16.9 % — ABNORMAL HIGH (ref 11.5–15.5)
WBC: 6.3 10*3/uL (ref 4.0–10.5)
nRBC: 0 % (ref 0.0–0.2)

## 2023-02-14 LAB — COMPREHENSIVE METABOLIC PANEL
ALT: 7 U/L (ref 0–44)
AST: 9 U/L — ABNORMAL LOW (ref 15–41)
Albumin: 2.5 g/dL — ABNORMAL LOW (ref 3.5–5.0)
Alkaline Phosphatase: 69 U/L (ref 38–126)
Anion gap: 16 — ABNORMAL HIGH (ref 5–15)
BUN: 19 mg/dL (ref 8–23)
CO2: 26 mmol/L (ref 22–32)
Calcium: 8.3 mg/dL — ABNORMAL LOW (ref 8.9–10.3)
Chloride: 93 mmol/L — ABNORMAL LOW (ref 98–111)
Creatinine, Ser: 3.93 mg/dL — ABNORMAL HIGH (ref 0.61–1.24)
GFR, Estimated: 16 mL/min — ABNORMAL LOW (ref >60)
Glucose, Bld: 180 mg/dL — ABNORMAL HIGH (ref 70–99)
Potassium: 3.9 mmol/L (ref 3.5–5.1)
Sodium: 135 mmol/L (ref 135–145)
Total Bilirubin: 0.9 mg/dL (ref 0.3–1.2)
Total Protein: 5.8 g/dL — ABNORMAL LOW (ref 6.5–8.1)

## 2023-02-14 LAB — C-REACTIVE PROTEIN: CRP: 16 mg/dL — ABNORMAL HIGH (ref <1.0)

## 2023-02-14 LAB — PROCALCITONIN: Procalcitonin: 0.32 ng/mL

## 2023-02-14 LAB — GLUCOSE, CAPILLARY
Glucose-Capillary: 136 mg/dL — ABNORMAL HIGH (ref 70–99)
Glucose-Capillary: 186 mg/dL — ABNORMAL HIGH (ref 70–99)
Glucose-Capillary: 170 mg/dL — ABNORMAL HIGH (ref 70–99)

## 2023-02-14 MED ORDER — ADENOSINE 6 MG/2ML IV SOLN
INTRAVENOUS | Status: AC
  Administered 2023-02-14: 12 mg
  Filled 2023-02-14: qty 2

## 2023-02-14 MED ORDER — AMIODARONE HCL 200 MG PO TABS
200.0000 mg | ORAL_TABLET | Freq: Two times a day (BID) | ORAL | Status: DC
  Administered 2023-02-15 – 2023-02-16 (×3): 200 mg via ORAL
  Filled 2023-02-14 (×3): qty 1

## 2023-02-14 MED ORDER — ADENOSINE 6 MG/2ML IV SOLN
INTRAVENOUS | Status: AC
  Filled 2023-02-14: qty 2

## 2023-02-14 MED ORDER — AMIODARONE HCL 200 MG PO TABS
200.0000 mg | ORAL_TABLET | Freq: Once | ORAL | Status: AC
  Administered 2023-02-14: 200 mg via ORAL
  Filled 2023-02-14: qty 1

## 2023-02-14 MED ORDER — CHLORHEXIDINE GLUCONATE CLOTH 2 % EX PADS: 6.0000 | MEDICATED_PAD | Freq: Every day | CUTANEOUS | Status: DC

## 2023-02-14 MED ORDER — ADENOSINE 6 MG/2ML IV SOLN
INTRAVENOUS | Status: AC
  Administered 2023-02-14: 6 mg
  Filled 2023-02-14: qty 2

## 2023-02-14 NOTE — Progress Notes (Signed)
Patients HR was sustained in the 150's. Pt was symptomatic with diaphoresis, slowed mentation, BP 55/41. Dr.Gardner and Rapid RN at bedside, given total of 18mg  adenosine by Rapid RN. Pt converted back to NSR, BP 128/61.

## 2023-02-14 NOTE — Evaluation (Signed)
Clinical/Bedside Swallow Evaluation Patient Details  Name: TANDON LINVILLE MRN: [id number] Date of Birth: November 24, 1955  Today's Date: 02/14/2023 Time: SLP Start Time (ACUTE ONLY): 1300 SLP Stop Time (ACUTE ONLY): 1315 SLP Time Calculation (min) (ACUTE ONLY): 15 min  Past Medical History:  Past Medical History:  Diagnosis Date   Acute blood loss anemia 03/20/2021   Acute metabolic encephalopathy 03/03/2022   Acute on chronic respiratory failure with hypoxia (HCC) 02/28/2022   Anemia    Arthritis    hips shoulders   CAD (coronary artery disease) 03/03/2019   Cancer (HCC)    Esophageal cancer 2022   Complete rotator cuff tear 08/26/2020   Depression 05/31/2021   Esophagitis    ESRD on hemodialysis (HCC)    Fatigue 10/14/2020   gram neg rod Bacteremia 03/01/2022   Heart failure with mildly reduced ejection fraction (HFmrEF) (HCC)    Hip fracture (HCC) 09/09/2022   Hypertension    Myocardial infarction (HCC)    Sleep apnea    uses a bipap machine   Stroke (HCC) 12/14/2020   no last weakness or paralysis   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   Past Surgical History:  Past Surgical History:  Procedure Laterality Date   AV FISTULA PLACEMENT Left 12/21/2021   Procedure: LEFT ARM ARTERIOVENOUS (AV) FISTULA CREATION;  Surgeon: Larina Earthly, MD;  Location: [facility];  Service: Vascular;  Laterality: Left;   BIOPSY  06/27/2019   Procedure: BIOPSY;  Surgeon: Malissa Hippo, MD;  [facility];  Service: Endoscopy;;  ascending colon polyp   BIOPSY  10/21/2020   Procedure: BIOPSY;  Surgeon: Malissa Hippo, MD;  [facility];  Service: Endoscopy;;  esophageal,gastric polyp   BIOPSY  02/24/2021   Procedure: BIOPSY;  Surgeon: Malissa Hippo, MD;  [facility];  Service: Endoscopy;;  distal and proximal esophageal biopsies    BIOPSY  05/30/2022   Procedure: BIOPSY;  Surgeon: Lanelle Bal, DO;  [facility];  Service: Endoscopy;;   CARDIAC SURGERY     CATARACT EXTRACTION     COLONOSCOPY N/A 06/27/2019   [name]:Diverticulosis in the entire examined colon. tubular adenoma in ascending colon, 4mm tubular adenoma in prox sigmoid. external hemorrhoids   CORONARY ARTERY BYPASS GRAFT N/A 03/25/2019   Procedure: CORONARY ARTERY BYPASS GRAFTING (CABG) X  4 USING LEFT INTERNAL MAMMARY ARTERY AND RIGHT SAPHENOUS VEIN GRAFTS;  Surgeon: Corliss Skains, MD;  Location: [facility];  Service: Open Heart Surgery;  Laterality: N/A;   ESOPHAGEAL BRUSHING  03/08/2022   Procedure: ESOPHAGEAL BRUSHING;  Surgeon: Lanelle Bal, DO;  [facility];  Service: Endoscopy;;   ESOPHAGOGASTRODUODENOSCOPY (EGD) WITH PROPOFOL N/A 10/21/2020   rehman:Normal hypopharynx.Normal proximal esophagus and mid esophagus.Esophageal mucosal changes consistent with long-segment Barrett's esophagus. (Focal low-grade dysplasia and atypia proximally) z line irregular 37 cm from incisors, 3 cm HH, single gastric polyp (fundic gland) normal duodenal bulb/second portion of duodenum, proximal margin of Barrett's at 34 cm   ESOPHAGOGASTRODUODENOSCOPY (EGD) WITH PROPOFOL N/A 02/24/2021   Procedure: ESOPHAGOGASTRODUODENOSCOPY (EGD) WITH PROPOFOL;  Surgeon: Malissa Hippo, MD;  [facility];  Service: Endoscopy;  Laterality: N/A;  1:35   ESOPHAGOGASTRODUODENOSCOPY (EGD) WITH PROPOFOL N/A 03/21/2021   Procedure: ESOPHAGOGASTRODUODENOSCOPY (EGD) WITH PROPOFOL;  Surgeon: Jeani Hawking, MD;  Location: [facility] ENDOSCOPY;  Service: Endoscopy;  Laterality: N/A;   ESOPHAGOGASTRODUODENOSCOPY (EGD) WITH PROPOFOL N/A 10/23/2021  Procedure: ESOPHAGOGASTRODUODENOSCOPY (EGD) WITH PROPOFOL;  Surgeon: Sherrilyn Rist, MD;  Location: [facility] ENDOSCOPY;  Service: Gastroenterology;  Laterality: N/A;   ESOPHAGOGASTRODUODENOSCOPY (EGD) WITH PROPOFOL N/A 03/08/2022   Procedure: ESOPHAGOGASTRODUODENOSCOPY (EGD) WITH  PROPOFOL;  Surgeon: Lanelle Bal, DO;  [facility];  Service: Endoscopy;  Laterality: N/A;   ESOPHAGOGASTRODUODENOSCOPY (EGD) WITH PROPOFOL N/A 05/27/2022   Procedure: ESOPHAGOGASTRODUODENOSCOPY (EGD) WITH PROPOFOL;  Surgeon: Dolores Frame, MD;  [facility];  Service: Gastroenterology;  Laterality: N/A;   ESOPHAGOGASTRODUODENOSCOPY (EGD) WITH PROPOFOL N/A 05/30/2022   Procedure: ESOPHAGOGASTRODUODENOSCOPY (EGD) WITH PROPOFOL;  Surgeon: Lanelle Bal, DO;  [facility];  Service: Endoscopy;  Laterality: N/A;   EXCISION MORTON'S NEUROMA     EYE SURGERY Left    retina   INCISION AND DRAINAGE OF WOUND Right 06/28/2021   Procedure: IRRIGATION AND DEBRIDEMENT WOUND;  Surgeon: Allena Napoleon, MD;  Location: [facility];  Service: Plastics;  Laterality: Right;  1.5 hour   INSERTION OF DIALYSIS CATHETER Right 12/21/2021   Procedure: INSERTION OF TUNNELED DIALYSIS CATHETER;  Surgeon: Larina Earthly, MD;  Location: [facility];  Service: Vascular;  Laterality: Right;   INTRAMEDULLARY (IM) NAIL INTERTROCHANTERIC Right 09/10/2022   Procedure: INTRAMEDULLARY (IM) NAIL INTERTROCHANTERIC;  Surgeon: Joen Laura, MD;  [facility]: [facility];  Service: Orthopedics;  Laterality: Right;   LEFT HEART CATH AND CORS/GRAFTS ANGIOGRAPHY N/A 02/01/2022   Procedure: LEFT HEART CATH AND CORS/GRAFTS ANGIOGRAPHY;  Surgeon: Swaziland, Peter M, MD;  Location: [facility] INVASIVE CV LAB;  Service: Cardiovascular;  Laterality: N/A;   POLYPECTOMY  06/27/2019   Procedure: POLYPECTOMY;  Surgeon: Malissa Hippo, MD;  [facility];  Service: Endoscopy;;  proximal sigmoid colon   RIGHT/LEFT HEART CATH AND CORONARY ANGIOGRAPHY N/A 03/12/2019   Procedure: RIGHT/LEFT HEART CATH AND CORONARY ANGIOGRAPHY;  Surgeon: Corky Crafts, MD;  Location: [facility]VASIVE CV LAB;  Service: Cardiovascular;  Laterality: N/A;   SHOULDER ARTHROSCOPY WITH ROTATOR CUFF REPAIR AND SUBACROMIAL DECOMPRESSION Right  08/26/2020   Procedure: RIGHT SHOULDER MINI OPEN ROTATOR CUFF REPAIR AND SUBACROMIAL DECOMPRESSION WITH PATCH GRAFT;  Surgeon: Jene Every, MD;  Location: [facility];  Service: Orthopedics;  Laterality: Right;  90 MINS GENERAL WITH BLOCK   SKIN SPLIT GRAFT Right 06/28/2021   Procedure: SKIN GRAFT SPLIT THICKNESS;  Surgeon: Allena Napoleon, MD;  Location: [facility];  Service: Plastics;  Laterality: Right;   TEE WITHOUT CARDIOVERSION N/A 03/25/2019   Procedure: TRANSESOPHAGEAL ECHOCARDIOGRAM (TEE);  Surgeon: Corliss Skains, MD;  Location: [facility] OR;  Service: Open Heart Surgery;  Laterality: N/A;   HPI:  Patient is a 67 y.o.  male with history of ESRD, SVT, HTN, HLD, Barrett's esophagus/esophageal cancer-who is he had COVID-19 infection approximately 2 weeks prior to this hospitalization-presented from his SNF with fatigue/lethargy/fever of 102F-chest x-ray showed diffuse interstitial infiltrates-concerning for CHF. Pt has improved since dialysis session. Pt has been seen by SLP this year, reported he typically consumes a dys 2 diet with thinliquids, small sips, in accordance also with GI recommendations.    Assessment / Plan / Recommendation  Clinical Impression  Pt reports he is very unhappy with his current food texture. He is ordered as dys 2 (minced/finely chopped) here, but at [facility] his food is pureed. His family believes minced is not an option so perhaps he was downgraded for safety. However, pt is able to masticate solids well. Instead of externally restricting his food texture, it may be more important  to have an upright position, slow pace, following bites with sips and avoiding any inappropriate foods he may get. Posted a sign with precautions and provided emesis bag incase pt regurgitates. Pt and family in agreement to attempt a dys 3 tray while here to introduce a greater variety of foods and check for tolerance. Will f/u. SLP Visit Diagnosis: Dysphagia, unspecified (R13.10)     Aspiration Risk  Risk for inadequate nutrition/hydration;Mild aspiration risk    Diet Recommendation Dysphagia 3 (Mech soft);Thin liquid    Liquid Administration via: Cup;Straw Medication Administration: Other (Comment) (as tolerated per pt) Supervision: Patient able to self feed Compensations: Slow rate;Small sips/bites Postural Changes: Seated upright at 90 degrees;Remain upright for at least 30 minutes after po intake    Other  Recommendations Oral Care Recommendations: Oral care BID    Recommendations for follow up therapy are one component of a multi-disciplinary discharge planning process, led by the attending physician.  Recommendations may be updated based on patient status, additional functional criteria and insurance authorization.  Follow up Recommendations No SLP follow up      Assistance Recommended at Discharge    Functional Status Assessment Patient has not had a recent decline in their functional status  Frequency and Duration min 2x/week  1 week       Prognosis Prognosis for improved oropharyngeal function: Guarded Barriers to Reach Goals: Time post onset      Swallow Study   General HPI: Patient is a 67 y.o.  male with history of ESRD, SVT, HTN, HLD, Barrett's esophagus/esophageal cancer-who is he had COVID-19 infection approximately 2 weeks prior to this hospitalization-presented from his SNF with fatigue/lethargy/fever of 102F-chest x-ray showed diffuse interstitial infiltrates-concerning for CHF. Pt has improved since dialysis session. Pt has been seen by SLP this year, reported he typically consumes a dys 2 diet with thinliquids, small sips, in accordance also with GI recommendations. Type of Study: Bedside Swallow Evaluation Previous Swallow Assessment: see HPI Diet Prior to this Study: Dysphagia 2 (finely chopped);Thin liquids (Level 0) Temperature Spikes Noted: No Respiratory Status: Room air History of Recent Intubation: No Behavior/Cognition:  Alert;Cooperative Oral Cavity Assessment: Within Functional Limits Oral Care Completed by SLP: Yes Oral Cavity - Dentition: Adequate natural dentition Vision: Functional for self-feeding Self-Feeding Abilities: Able to feed self Patient Positioning: Upright in bed Baseline Vocal Quality: Normal Volitional Cough: Strong Volitional Swallow: Able to elicit    Oral/Motor/Sensory Function Overall Oral Motor/Sensory Function: Within functional limits   Ice Chips     Thin Liquid Thin Liquid: Within functional limits    Nectar Thick Nectar Thick Liquid: Not tested   Honey Thick Honey Thick Liquid: Not tested   Puree Puree: Within functional limits   Solid     Solid: Within functional limits      Athene Schuhmacher, Riley Nearing 02/14/2023,1:48 PM

## 2023-02-14 NOTE — Progress Notes (Signed)
Patient refused his chest xray.

## 2023-02-14 NOTE — Progress Notes (Addendum)
PROGRESS NOTE        PATIENT DETAILS Name: [name] Age: 67 y.o. Sex: male Date of Birth: [birth year] Admit Date: 02/13/2023 Admitting Physician Synetta Fail, MD OZD:[id number], Jonna Coup, MD  Brief Summary: Patient is a 67 y.o.  male with history of ESRD, SVT, HTN, HLD, Barrett's esophagus/esophageal cancer-who is he had COVID-19 infection approximately 2 weeks prior to this hospitalization-presented from his SNF with fatigue/lethargy/fever of 102F-chest x-ray showed diffuse interstitial infiltrates-concerning for CHF-he was subsequently admitted to the hospitalist service.   Significant events: 9/2>> from SNF-fatigue/fever/diffuse infiltrates on CXR-concern for CHF-nephrology consulted-underwent HD-admit to [facility]. 9/3>> SVT with hypotension-required 12 mg of adenosine to restore sinus rhythm.  Significant studies: 9/2>> CXR: Diffuse interstitial infiltrates 9/2>> CT head: No acute abnormalities  Significant microbiology data: 9/2>> blood culture: No growth 9/2>> influenza/RSV PCR: Negative 9/2>> COVID PCR:+ve  Procedures: None none  Consults: None  Subjective: Appears comfortable-very uncooperative-pushes me away-will not talk to me.  Oxygen decreased to 2 L.  Objective: Vitals: Blood pressure (!) 129/59, pulse 82, temperature 98.7 F (37.1 C), temperature source Oral, resp. rate 15, height 5\' 10"  (1.778 m), weight 81.6 kg, SpO2 100%.   Exam: Gen Exam:Alert awake-not in any distress HEENT:atraumatic, normocephalic Chest: B/L clear to auscultation anteriorly CVS:S1S2 regular Abdomen:soft non tender, non distended Extremities:no edema Neurology: Limited exam-uncooperative-moving all 4 extremities. Skin: no rash  Pertinent Labs/Radiology:    Latest Ref Rng & Units 02/13/2023   11:29 AM 11/28/2022    8:00 AM 11/26/2022   12:40 AM  CBC  WBC 4.0 - 10.5 K/uL 5.7  7.0  6.1   Hemoglobin 13.0 - 17.0 g/dL 9.3  47.4  25.9   Hematocrit 39.0 -  52.0 % 32.0  33.9  34.7   Platelets 150 - 400 K/uL 203  310  274     Lab Results  Component Value Date   NA 134 (L) 02/13/2023   K 3.6 02/13/2023   CL 93 (L) 02/13/2023   CO2 28 02/13/2023     Assessment/Plan: Acute metabolic encephalopathy Recent COVID-19 infection Superimposed bacterial pneumonia versus acute combined systolic/diastolic CHF in the setting of dialysis Encephalopathy has resolved-was apparently febrile at SNF Unclear to me if he has COVID-19 pneumonitis/HCAP or this is pulmonary edema in the setting of missed ESRD/decompensated heart failure  He had hemodialysis last night-with removal of 2.7 L of fluid.  His oxygen requirement has improved-he is down to 2 L of oxygen-will check CXR/CRP-if no significant improvement-May need to be started on steroids/antibiotics.  Addendum Unfortunately refused chest x-ray this morning-will reattempt later today.  Addendum Chest x-ray finally done this afternoon-improvement in lung infiltrates-this likely reflects pulmonary edema secondary to  missed HD-rather than PNA.  Hold off on starting antibiotics.  Diffuse interstitial infiltrates on CXR-acute pulmonary edema due to acute on chronic combined systolic/diastolic CHF in the setting of missed HD versus HCAP/COVID pneumonitis See above regarding plan  SVT With hemodynamic instability last night Continue beta-blocker/amiodarone Cardiology consulted-has had frequent bursts of SVT-reportedly hard to control in the past. Telemetry monitoring  ESRD on HD [facility] Nephrology following  Normocytic anemia Secondary to ESRD Iron/Aranesp deferred to nephrology service  History of CAD s/p CABG 2020 History of CVA Appears stable-no anginal symptoms-no gross deficits (uncooperative-difficult exam) Aspirin/beta-blocker Resume Repatha as an outpatient  HTN BP stable Metoprolol  HLD Repatha as  outpatient  History of Barrett's esophagus/esophageal adenocarcinoma-follows with [facility]  oncology GERD/dysphagia PPI  Dysphagia 2 diet-SLP eval  Depression Zoloft  OSA Per chart/H&P-no longer uses CPAP  BMI: Estimated body mass index is 25.81 kg/m as calculated from the following:   Height as of this encounter: 5\' 10"  (1.778 m).   Weight as of this encounter: 81.6 kg.   Code status:   Code Status: Do not attempt resuscitation (DNR) PRE-ARREST INTERVENTIONS DESIRED   DVT Prophylaxis: heparin injection 5,000 Units Start: 02/13/23 1515   Family Communication: None at bedside   Disposition Plan: Status is: Observation The patient will require care spanning > 2 midnights and should be moved to inpatient because: Severity of illness   Planned Discharge Destination:Skilled nursing facility   Diet: Diet Order             DIET DYS 2 Room service appropriate? Yes; Fluid consistency: Thin  Diet effective now                     Antimicrobial agents: Anti-infectives (From admission, onward)    Start     Dose/Rate Route Frequency Ordered Stop   02/13/23 1430  cefTRIAXone (ROCEPHIN) 1 g in sodium chloride 0.9 % 100 mL IVPB        1 g 200 mL/hr over 30 Minutes Intravenous  Once 02/13/23 1425 02/13/23 1629   02/13/23 1430  azithromycin (ZITHROMAX) 500 mg in sodium chloride 0.9 % 250 mL IVPB        500 mg 250 mL/hr over 60 Minutes Intravenous  Once 02/13/23 1425 02/13/23 1725        MEDICATIONS: Scheduled Meds:  adenosine       amiodarone  200 mg Oral Daily   Chlorhexidine Gluconate Cloth  6 each Topical Q0600   [START ON 02/15/2023] fentaNYL  1 patch Transdermal Q72H   heparin  5,000 Units Subcutaneous Q8H   insulin aspart  0-6 Units Subcutaneous TID WC   levothyroxine  25 mcg Oral Daily   metoprolol tartrate  25 mg Oral BID   pantoprazole  40 mg Oral BID   sertraline  100 mg Oral Daily   sodium chloride flush  3 mL Intravenous Q12H   Continuous Infusions: PRN Meds:.acetaminophen **OR** acetaminophen, adenosine, polyethylene glycol   I have  personally reviewed following labs and imaging studies  LABORATORY DATA: CBC: Recent Labs  Lab 02/13/23 1129  WBC 5.7  NEUTROABS 4.5  HGB 9.3*  HCT 32.0*  MCV 81.6  PLT 203    Basic Metabolic Panel: Recent Labs  Lab 02/13/23 1129  NA 134*  K 3.6  CL 93*  CO2 28  GLUCOSE 155*  BUN 31*  CREATININE 5.84*  CALCIUM 8.3*  MG 2.1    GFR: Estimated Creatinine Clearance: 12.8 mL/min (A) (by C-G formula based on SCr of 5.84 mg/dL (H)).  Liver Function Tests: Recent Labs  Lab 02/13/23 1129  AST 9*  ALT 6  ALKPHOS 61  BILITOT 1.0  PROT 6.0*  ALBUMIN 2.5*   Recent Labs  Lab 02/13/23 1129  LIPASE 31   Recent Labs  Lab 02/13/23 1652  AMMONIA 17    Coagulation Profile: No results for input(s): "INR", "PROTIME" in the last 168 hours.  Cardiac Enzymes: No results for input(s): "CKTOTAL", "CKMB", "CKMBINDEX", "TROPONINI" in the last 168 hours.  BNP (last 3 results) No results for input(s): "PROBNP" in the last 8760 hours.  Lipid Profile: No results for input(s): "CHOL", "HDL", "LDLCALC", "TRIG", "CHOLHDL", "  LDLDIRECT" in the last 72 hours.  Thyroid Function Tests: Recent Labs    02/13/23 1129  TSH 4.229    Anemia Panel: No results for input(s): "VITAMINB12", "FOLATE", "FERRITIN", "TIBC", "IRON", "RETICCTPCT" in the last 72 hours.  Urine analysis:    Component Value Date/Time   COLORURINE AMBER (A) 02/13/2023 1356   APPEARANCEUR CLEAR 02/13/2023 1356   LABSPEC 1.015 02/13/2023 1356   PHURINE 7.0 02/13/2023 1356   GLUCOSEU NEGATIVE 02/13/2023 1356   HGBUR NEGATIVE 02/13/2023 1356   BILIRUBINUR NEGATIVE 02/13/2023 1356   KETONESUR NEGATIVE 02/13/2023 1356   PROTEINUR >=300 (A) 02/13/2023 1356   NITRITE NEGATIVE 02/13/2023 1356   LEUKOCYTESUR NEGATIVE 02/13/2023 1356    Sepsis Labs: Lactic Acid, Venous    Component Value Date/Time   LATICACIDVEN 1.2 02/13/2023 1652    MICROBIOLOGY: Recent Results (from the past 240 hour(s))  Blood culture  (routine x 2)     Status: None (Preliminary result)   Collection Time: 02/13/23 11:30 AM   Specimen: BLOOD  Result Value Ref Range Status   Specimen Description BLOOD SITE NOT SPECIFIED  Final   Special Requests   Final    BOTTLES DRAWN AEROBIC AND ANAEROBIC Blood Culture results may not be optimal due to an inadequate volume of blood received in culture bottles   Culture   Final    NO GROWTH < 24 HOURS Performed at [facility], [address], [place], [place] [address]    Report Status PENDING  Incomplete  Blood culture (routine x 2)     Status: None (Preliminary result)   Collection Time: 02/13/23 11:35 AM   Specimen: BLOOD  Result Value Ref Range Status   Specimen Description BLOOD SITE NOT SPECIFIED  Final   Special Requests   Final    BOTTLES DRAWN AEROBIC AND ANAEROBIC Blood Culture results may not be optimal due to an inadequate volume of blood received in culture bottles   Culture   Final    NO GROWTH < 24 HOURS Performed at [facility], [address], [place], [place] [address]    Report Status PENDING  Incomplete  Resp panel by RT-PCR (RSV, Flu A&B, Covid) Peripheral     Status: Abnormal   Collection Time: 02/13/23 12:01 PM   Specimen: Peripheral; Nasal Swab  Result Value Ref Range Status   SARS Coronavirus 2 by RT PCR POSITIVE (A) NEGATIVE Final   Influenza A by PCR NEGATIVE NEGATIVE Final   Influenza B by PCR NEGATIVE NEGATIVE Final    Comment: (NOTE) The Xpert Xpress SARS-CoV-2/FLU/RSV plus assay is intended as an aid in the diagnosis of influenza from Nasopharyngeal swab specimens and should not be used as a sole basis for treatment. Nasal washings and aspirates are unacceptable for Xpert Xpress SARS-CoV-2/FLU/RSV testing.  Fact Sheet for Patients: BloggerCourse.com  Fact Sheet for Healthcare Providers: SeriousBroker.it  This test is not yet approved or cleared by the [place] FDA and has  been authorized for detection and/or diagnosis of SARS-CoV-2 by FDA under an Emergency Use Authorization (EUA). This EUA will remain in effect (meaning this test can be used) for the duration of the COVID-19 declaration under Section 564(b)(1) of the Act, 21 U.S.C. section 360bbb-3(b)(1), unless the authorization is terminated or revoked.     Resp Syncytial Virus by PCR NEGATIVE NEGATIVE Final    Comment: (NOTE) Fact Sheet for Patients: BloggerCourse.com  Fact Sheet for Healthcare Providers: SeriousBroker.it  This test is not yet approved or cleared by  the [employer] and has been authorized for detection and/or diagnosis of SARS-CoV-2 by FDA under an Emergency Use Authorization (EUA). This EUA will remain in effect (meaning this test can be used) for the duration of the COVID-19 declaration under Section 564(b)(1) of the Act, 21 U.S.C. section 360bbb-3(b)(1), unless the authorization is terminated or revoked.  Performed at [facility], [address], [place], [place] [address]     RADIOLOGY STUDIES/RESULTS: CT HEAD WO CONTRAST ( )  Result Date: 02/13/2023 CLINICAL DATA:  67 year old male with history of altered mental status and somnolence. EXAM: CT HEAD WITHOUT CONTRAST TECHNIQUE: Contiguous axial images were obtained from the base of the skull through the vertex without intravenous contrast. RADIATION DOSE REDUCTION: This exam was performed according to the departmental dose-optimization program which includes automated exposure control, adjustment of the mA and/or kV according to patient size and/or use of iterative reconstruction technique. COMPARISON:  Head CT 09/09/2022. FINDINGS: Brain: Moderate cerebral and mild cerebellar atrophy, advanced for age. Patchy and confluent areas of decreased attenuation are noted throughout the deep and periventricular white matter of the cerebral hemispheres bilaterally, compatible  with chronic microvascular ischemic disease. No evidence of acute infarction, hemorrhage, hydrocephalus, extra-axial collection or mass lesion/mass effect. Vascular: No hyperdense vessel or unexpected calcification. Skull: Normal. Negative for fracture or focal lesion. Sinuses/Orbits: No acute finding. Other: None. IMPRESSION: 1. No acute intracranial abnormalities. 2. Moderate cerebral and mild cerebellar atrophy with extensive chronic microvascular ischemic changes in the cerebral white matter, as above. Electronically Signed   By: Trudie Reed M.D.   On: 02/13/2023 12:41   DG Chest Portable 1 View  Result Date: 02/13/2023 CLINICAL DATA:  Fever and cough.  Shortness of breath.  Fatigue. EXAM: PORTABLE CHEST 1 VIEW COMPARISON:  11/25/2022 FINDINGS: Stable mild cardiomegaly.  Prior CABG again noted. Low lung volumes again demonstrated. Diffuse interstitial infiltrates are likely due to interstitial edema. New small right pleural effusion also noted. IMPRESSION: Diffuse interstitial infiltrates, likely due to interstitial edema/congestive heart failure. New small right pleural effusion. Electronically Signed   By: Danae Orleans M.D.   On: 02/13/2023 12:36     LOS: 0 days   Jeoffrey Massed, MD  [facility]    To contact the attending provider between 7A-7P or the covering provider during after hours 7P-7A, please log into the web site [email or web address] and access using universal [place] password for that web site. If you do not have the password, please call the hospital operator.  02/14/2023, 8:31 AM

## 2023-02-14 NOTE — Significant Event (Signed)
[facility] night coverage note: late entry  Pt seen at bedside emergently at ~0420 after RN messaged that he had sudden onset of sustained HR in the 150s.  Pt appeared to be SVT on monitor.  Brief review of chart shows h/o same previously.  Pt diaphoretic, SBP 50s-60s, cool and clammy.  Mentation significantly altered.  Per DNR code status: may intubate, use ACLS meds, and perform cardioversion, but NO CPR or chest compressions.  6mg  Adenosine pushed at 0428 with no change in HR or rhythm.  12mg  adenosine given at 0433 with successful cardioversion to NSR in the 80s.  SBP 129.  Mental status improving significantly thereafter (pt now GCS 15, obeying commands, able to state name, name a fork and a cell phone.  Gave location as "in bed").

## 2023-02-14 NOTE — Telephone Encounter (Signed)
Left a message for a return call for details of drs notes and questionnaire

## 2023-02-14 NOTE — Progress Notes (Signed)
Pt refused his morning lab.Pt educated on its importance.

## 2023-02-14 NOTE — Telephone Encounter (Signed)
This procedure that [facility] wants to do would simply be on hold until he is doing much better.  There is no need to do any follow-up phone call with him regarding this.  I will do further follow-up on this issue when I return next week

## 2023-02-14 NOTE — Consult Note (Addendum)
Cardiology Consultation   Patient ID: RONYN DZIADOSZ MRN: [id number]; DOB: 09/15/1955  Admit date: 02/13/2023 Date of Consult: 02/14/2023  PCP:  Babs Sciara, MD   [facility] Providers Cardiologist:  Chilton Si, MD  Electrophysiologist:  Lewayne Bunting, MD       Patient Profile:   [name] is a 67 y.o. 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 Jerral Ralph.  History of Present Illness:   Mr. Priess has an extremely complex medical history as above with numerous complex admissions. He had bypass in 2020. LVEF has been predominantly in the 40-50% range the last several years. He had a stroke in 2022 prompting increase in ASA - Zio showed NSR, 3.5% PVC burden, 3 brief NSVT (max 5 beats), 12 runs PSVT.  Carotid duplex showed no significant stenosis bilaterally. From 2023 onward he was admitted for various issues including CHF then renal failure progressing to ESRD, hematemesis requiring RFA in context of known esophageal CA and issues with esophagitis, anemia requiring transfusion, pleural effusion requiring thoracentesis, bacteremia, with admissions frequently notable for breakthrough SVT. He had required amiodarone titrations perodicially. Dr. Ladona Ridgel did not think he was a candidate for ablation. Last cath was 01/2022 cath for chest pain showing patent grafts recommended for medical management. Last echo (limited study 11/2022) showed EF 50-55%, normal RV, RVSP normalized from prior, mild TR. He had possible PAF during 09/2022 admission, recommended against anticoagulation  given esophageal carcinoma, ESRD with anemia, and history of falls. At last follow-up 12/2022 with Dr. Ladona Ridgel his SVT was felt to be well controlled and amiodarone was reduced back to 200mg  daily.   He presented this admission from SNF with fatigue, lethargy, fever of 102, and encephalopathy. CXR showed diffuse infiltrates, possible concern for CHF. Covid PCR was positive. Notes indicate he had tested positive for Covid initially about 2 weeks ago. He was subsequently felt to have suspected pulmonary edema due to CHF in setting of missed HD (missed Wednesday but attended Friday) versus HCAP/Covid pneumonitis. He has periodically been refusing testing such as labs or imaging. He developed recurrent SVT overnight with HR 150s with associated hypotension SBP 50s-60s and worsening mentation. He received 6mg  adenosine without response, then subsequently 12mg  with successful reversion to NSR with HR 80s. He is unable to recall the details of this event or any CP/SOB. TSH this admission 4.229. Last BP 142/65. He has been continued on home metoprolol 25mg  BID and amiodarone 200mg  daily. His sister Amy, an ER physician, is at bedside and reports his mentation is slightly better today.   QTC currently around by telemetry with QRS duration ~199ms.   Past Medical History:  Diagnosis Date   Acute blood loss anemia 03/20/2021   Acute metabolic encephalopathy 03/03/2022   Acute on chronic respiratory failure with hypoxia (HCC) 02/28/2022   Anemia    Arthritis    hips shoulders   CAD (coronary artery disease) 03/03/2019   Cancer (HCC)    Esophageal cancer 2022   Complete rotator cuff tear 08/26/2020   Depression 05/31/2021   Esophagitis    ESRD on hemodialysis (HCC)  Fatigue 10/14/2020   gram neg rod Bacteremia 03/01/2022   Heart failure with mildly reduced ejection fraction (HFmrEF) (HCC)    Hip fracture (HCC) 09/09/2022   Hypertension    Myocardial infarction [facility])    Sleep apnea    uses a  bipap machine   Stroke (HCC) 12/14/2020   no last weakness or paralysis   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    Past Surgical History:  Procedure Laterality Date   AV FISTULA PLACEMENT Left 12/21/2021   Procedure: LEFT ARM ARTERIOVENOUS (AV) FISTULA CREATION;  Surgeon: Larina Earthly, MD;  Location: [facility] ORS;  Service: Vascular;  Laterality: Left;   BIOPSY  06/27/2019   Procedure: BIOPSY;  Surgeon: Malissa Hippo, MD;  Location: [facility];  Service: Endoscopy;;  ascending colon polyp   BIOPSY  10/21/2020   Procedure: BIOPSY;  Surgeon: Malissa Hippo, MD;  Location: [facility];  Service: Endoscopy;;  esophageal,gastric polyp   BIOPSY  02/24/2021   Procedure: BIOPSY;  Surgeon: Malissa Hippo, MD;  [facility]: [facility];  Service: Endoscopy;;  distal and proximal esophageal biopsies    BIOPSY  05/30/2022   Procedure: BIOPSY;  Surgeon: Lanelle Bal, DO;  [facility]: [facility];  Service: Endoscopy;;   CARDIAC SURGERY     CATARACT EXTRACTION     COLONOSCOPY N/A 06/27/2019   [name]:Diverticulosis in the entire examined colon. tubular adenoma in ascending colon, 4mm tubular adenoma in prox sigmoid. external hemorrhoids   CORONARY ARTERY BYPASS GRAFT N/A 03/25/2019   Procedure: CORONARY ARTERY BYPASS GRAFTING (CABG) X  4 USING LEFT INTERNAL MAMMARY ARTERY AND RIGHT SAPHENOUS VEIN GRAFTS;  Surgeon: Corliss Skains, MD;  Location: [facility] OR;  Service: Open Heart Surgery;  Laterality: N/A;   ESOPHAGEAL BRUSHING  03/08/2022   Procedure: ESOPHAGEAL BRUSHING;  Surgeon: Lanelle Bal, DO;  [facility];  Service: Endoscopy;;   ESOPHAGOGASTRODUODENOSCOPY (EGD) WITH PROPOFOL N/A 10/21/2020   rehman:Normal hypopharynx.Normal proximal esophagus and mid esophagus.Esophageal mucosal changes consistent with long-segment Barrett's esophagus. (Focal low-grade dysplasia and atypia proximally) z line irregular 37  cm from incisors, 3 cm HH, single gastric polyp (fundic gland) normal duodenal bulb/second portion of duodenum, proximal margin of Barrett's at 34 cm   ESOPHAGOGASTRODUODENOSCOPY (EGD) WITH PROPOFOL N/A 02/24/2021   Procedure: ESOPHAGOGASTRODUODENOSCOPY (EGD) WITH PROPOFOL;  Surgeon: Malissa Hippo, MD;  [facility];  Service: Endoscopy;  Laterality: N/A;  1:35   ESOPHAGOGASTRODUODENOSCOPY (EGD) WITH PROPOFOL N/A 03/21/2021   Procedure: ESOPHAGOGASTRODUODENOSCOPY (EGD) WITH PROPOFOL;  Surgeon: Jeani Hawking, MD;  Location: [facility] ENDOSCOPY;  Service: Endoscopy;  Laterality: N/A;   ESOPHAGOGASTRODUODENOSCOPY (EGD) WITH PROPOFOL N/A 10/23/2021   Procedure: ESOPHAGOGASTRODUODENOSCOPY (EGD) WITH PROPOFOL;  Surgeon: Sherrilyn Rist, MD;  Location: [facility] ENDOSCOPY;  Service: Gastroenterology;  Laterality: N/A;   ESOPHAGOGASTRODUODENOSCOPY (EGD) WITH PROPOFOL N/A 03/08/2022   Procedure: ESOPHAGOGASTRODUODENOSCOPY (EGD) WITH PROPOFOL;  Surgeon: Lanelle Bal, DO;  [facility];  Service: Endoscopy;  Laterality: N/A;   ESOPHAGOGASTRODUODENOSCOPY (EGD) WITH PROPOFOL N/A 05/27/2022   Procedure: ESOPHAGOGASTRODUODENOSCOPY (EGD) WITH PROPOFOL;  Surgeon: Dolores Frame, MD;  [facility];  Service: Gastroenterology;  Laterality: N/A;   ESOPHAGOGASTRODUODENOSCOPY (EGD) WITH PROPOFOL N/A 05/30/2022   Procedure: ESOPHAGOGASTRODUODENOSCOPY (EGD) WITH PROPOFOL;  Surgeon: Lanelle Bal, DO;  [facility];  Service: Endoscopy;  Laterality: N/A;   EXCISION MORTON'S NEUROMA     EYE SURGERY  Left    retina   INCISION AND DRAINAGE OF WOUND Right 06/28/2021   Procedure: IRRIGATION AND DEBRIDEMENT WOUND;  Surgeon: Allena Napoleon, MD;  Location: [facility] OR;  Service: Plastics;  Laterality: Right;  1.5 hour   INSERTION OF DIALYSIS CATHETER Right 12/21/2021   Procedure: INSERTION OF TUNNELED DIALYSIS CATHETER;  Surgeon: Larina Earthly, MD;  Location: [facility];  Service: Vascular;   Laterality: Right;   INTRAMEDULLARY (IM) NAIL INTERTROCHANTERIC Right 09/10/2022   Procedure: INTRAMEDULLARY (IM) NAIL INTERTROCHANTERIC;  Surgeon: Joen Laura, MD;  Location: [facility];  Service: Orthopedics;  Laterality: Right;   LEFT HEART CATH AND CORS/GRAFTS ANGIOGRAPHY N/A 02/01/2022   Procedure: LEFT HEART CATH AND CORS/GRAFTS ANGIOGRAPHY;  Surgeon: Swazil[place], Peter M, MD;  Location: [facility] INVASIVE CV LAB;  Service: Cardiovascular;  Laterality: N/A;   POLYPECTOMY  06/27/2019   Procedure: POLYPECTOMY;  Surgeon: Malissa Hippo, MD;  [facility];  Service: Endoscopy;;  proximal sigmoid colon   RIGHT/LEFT HEART CATH AND CORONARY ANGIOGRAPHY N/A 03/12/2019   Procedure: RIGHT/LEFT HEART CATH AND CORONARY ANGIOGRAPHY;  Surgeon: Corky Crafts, MD;  Location: [facility] INVASIVE CV LAB;  Service: Cardiovascular;  Laterality: N/A;   SHOULDER ARTHROSCOPY WITH ROTATOR CUFF REPAIR AND SUBACROMIAL DECOMPRESSION Right 08/26/2020   Procedure: RIGHT SHOULDER MINI OPEN ROTATOR CUFF REPAIR AND SUBACROMIAL DECOMPRESSION WITH PATCH GRAFT;  Surgeon: Jene Every, MD;  Location: [facility] ORS;  Service: Orthopedics;  Laterality: Right;  90 MINS GENERAL WITH BLOCK   SKIN SPLIT GRAFT Right 06/28/2021   Procedure: SKIN GRAFT SPLIT THICKNESS;  Surgeon: Allena Napoleon, MD;  Location: [facility];  Service: Plastics;  Laterality: Right;   TEE WITHOUT CARDIOVERSION N/A 03/25/2019   Procedure: TRANSESOPHAGEAL ECHOCARDIOGRAM (TEE);  Surgeon: Corliss Skains, MD;  [facility] OR;  Service: Open Heart Surgery;  Laterality: N/A;     Home Medications:  Prior to Admission medications   Medication Sig Start Date End Date Taking? Authorizing Provider  amiodarone (PACERONE) 200 MG tablet Take 1 tablet (200 mg total) by mouth daily. 12/27/22  Yes Marinus Maw, MD  calcium acetate (PHOSLO) 667 MG capsule Take 667 mg by mouth See admin instructions. Take 667 mg by mouth twice daily on Monday, Wednesday, and Friday   Yes  [provider]  Calcium Acetate 667 MG TABS Take 667 mg by mouth See admin instructions. Take 667 mg by mouth three times daily on Tuesday, Thursday, Saturday, and Sunday   Yes [provider]  doxazosin (CARDURA) 2 MG tablet TAKE 1 TABLET BY MOUTH DAILY 07/29/22  Yes [name], Scott A, MD  EPINEPHrine 0.3 mg/0.3 mL IJ SOAJ injection Inject 0.3 mg into the muscle as needed for anaphylaxis. 01/25/21  Yes [name], Jonna Coup, MD  Evolocumab (REPATHA SURECLICK) 140 MG/ML SOAJ Inject 140 mg into the skin every 14 (fourteen) days. 06/29/22  Yes Alver Sorrow, NP  fentaNYL (DURAGESIC) 25 MCG/HR Place 1 patch onto the skin every 3 (three) days.   Yes [provider]  gabapentin (NEURONTIN) 100 MG capsule Take 100 mg by mouth at bedtime.   Yes [provider]  HYDROmorphone (DILAUDID) 2 MG tablet Take 2 mg by mouth every 6 (six) hours as needed for severe pain.   Yes [provider]  insulin lispro (HUMALOG KWIKPEN) 100 UNIT/ML KwikPen Inject 0-15 Units into the skin 3 (three) times daily. BS 0-120 inject 0 units BS 121-150 inject 2 units  BS 151-200 inject 3 units  BS 201-250 inject  5 units  BS 251-300 inject 8 units  BS 301-350 inject 11 units  BS 351-400 inject 15 units BS 401-999 inject 17 units and call provider   Yes [provider]  levothyroxine (SYNTHROID) 25 MCG tablet Take 25 mcg by mouth daily.   Yes [provider]  metoprolol tartrate (LOPRESSOR) 25 MG tablet Take 1 tablet (25 mg total) by mouth 2 (two) times daily. In addition you can take a 3rd dose of metoprolol as needed for heart rate > 120 Patient taking differently: Take 25 mg by mouth 2 (two) times daily. 09/22/22  Yes Gwyneth Sprout, MD  metoprolol tartrate (LOPRESSOR) 25 MG tablet Take 25 mg by mouth daily as needed (for heart rate > 120).   Yes [provider]  ondansetron (ZOFRAN) 4 MG/2ML SOLN injection Inject 4 mg into the vein daily. PRN order: inject 4 mg  every 8 hours as needed for nausea and vomiting   Yes [provider]  OXYGEN Inhale 2 L into the lungs continuous.   Yes [provider]  pantoprazole (PROTONIX) 40 MG tablet Take 1 tablet (40 mg total) by mouth 2 (two) times daily. 05/30/22  Yes Tat, Onalee Hua, MD  polyethylene glycol (MIRALAX) 17 g packet Take 17 g by mouth daily. 11/29/22  Yes Mabe, [name], MD  sertraline (ZOLOFT) 100 MG tablet Take 1 tablet (100 mg total) by mouth daily. 11/29/22  Yes Mabe, [name], MD  UNABLE TO FIND Take 1 Dose by mouth in the morning and at bedtime. Med Name: Med plus 2.0   Yes [provider]    Inpatient Medications: Scheduled Meds:  adenosine       amiodarone  200 mg Oral Daily   Chlorhexidine Gluconate Cloth  6 each Topical Q0600   [START ON 02/15/2023] fentaNYL  1 patch Transdermal Q72H   heparin  5,000 Units Subcutaneous Q8H   insulin aspart  0-6 Units Subcutaneous TID WC   levothyroxine  25 mcg Oral Daily   metoprolol tartrate  25 mg Oral BID   pantoprazole  40 mg Oral BID   sertraline  100 mg Oral Daily   sodium chloride flush  3 mL Intravenous Q12H   Continuous Infusions:  PRN Meds: acetaminophen **OR** acetaminophen, adenosine, polyethylene glycol  Allergies:    Allergies  Allergen Reactions   Bee Venom Anaphylaxis   Atorvastatin Other (See Comments)    myalgia   Diltiazem Itching   Lopressor [Metoprolol]     NIGHTMARES. Pt tolerating this now 11/25/22   Oxycodone Other (See Comments)    Reaction not listed on MAR    Reglan [Metoclopramide] Other (See Comments)    Suicidal    Rosuvastatin Other (See Comments)    myalgias   Valsartan Itching    Social History:   Social History   Socioeconomic History   Marital status: Single    Spouse name: Not on file   Number of children: 0   Years of education: 12   Highest education level: 12th grade  Occupational History   Not on file  Tobacco Use   Smoking status: Former    Current packs/day: 0.00     Types: Cigarettes    Quit date: 07/06/1988    Years since quitting: 34.6    Passive exposure: Past   Smokeless tobacco: Never  Vaping Use   Vaping status: Never Used  Substance and Sexual Activity   Alcohol use: Not Currently    Alcohol/week: 2.0 standard drinks of alcohol  Types: 2 Glasses of wine per week    Comment: socially   Drug use: No   Sexual activity: Not Currently    Partners: Female  Other Topics Concern   Not on file  Social History Narrative   Not on file   Social Determinants of Health   Financial Resource Strain: Low Risk  (11/16/2022)   Overall Financial Resource Strain (CARDIA)    Difficulty of Paying Living Expenses: Not hard at all  Food Insecurity: No Food Insecurity (11/25/2022)   Hunger Vital Sign    Worried About Running Out of Food in the Last Year: Never true    Ran Out of Food in the Last Year: Never true  Transportation Needs: No Transportation Needs (11/25/2022)   PRAPARE - Administrator, Civil Service (Medical): No    Lack of Transportation (Non-Medical): No  Recent Concern: Transportation Needs - Unmet Transportation Needs (09/09/2022)   PRAPARE - Administrator, Civil Service (Medical): Yes    Lack of Transportation (Non-Medical): No  Physical Activity: Sufficiently Active (11/16/2022)   Exercise Vital Sign    Days of Exercise per Week: 5 days    Minutes of Exercise per Session: 50 min  Stress: No Stress Concern Present (11/16/2022)   [employer] of Occupational Health - Occupational Stress Questionnaire    Feeling of Stress : Only a little  Social Connections: Unknown (11/16/2022)   Social Connection and Isolation Panel [NHANES]    Frequency of Communication with Friends and Family: More than three times a week    Frequency of Social Gatherings with Friends and Family: More than three times a week    Attends Religious Services: More than 4 times per year    Active Member of Clubs or Organizations: Yes    Attends  Banker Meetings: More than 4 times per year    Marital Status: Not on file  Intimate Partner Violence: Not At Risk (11/25/2022)   Humiliation, Afraid, Rape, and Kick questionnaire    Fear of Current or Ex-Partner: No    Emotionally Abused: No    Physically Abused: No    Sexually Abused: No    Family History:   Family History  Problem Relation Age of Onset   Colon cancer Mother    Heart Problems Father    Diabetes Father    Valvular heart disease Father    Sleep apnea Neg Hx    Stroke Neg Hx      ROS:  Limited given encephalopathy this admission  Physical Exam/Data:   Vitals:   02/14/23 0119 02/14/23 0545 02/14/23 0900 02/14/23 1100  BP: (!) 118/52 (!) 129/59 136/62 (!) 142/65  Pulse:  82 75 77  Resp:  15 16 19   Temp:  98.7 F (37.1 C) 97.6 F (36.4 C)   TempSrc:  Oral    SpO2: 98% 100% 99%   Weight:      Height:        Intake/Output Summary (Last 24 hours) at 02/14/2023 1233 Last data filed at 02/14/2023 0020 Gross per 24 hour  Intake 358.47 ml  Output 2700 ml  Net -2341.53 ml      02/13/2023   11:27 AM 12/27/2022    1:33 PM 11/28/2022   11:24 AM  Last 3 Weights  Weight (lbs) 179 lb 14.3 oz 180 lb 174 lb 13.2 oz  Weight (kg) 81.6 kg 81.647 kg 79.3 kg     Body mass index is 25.81 kg/m.  General:  chronically ill appearing WM in no acute distress. Head: Normocephalic, atraumatic, sclera non-icteric, no xanthomas, nares are without discharge. Neck: Negative for carotid bruits. JVP not elevated. Lungs: Clear bilaterally to auscultation without wheezes, rales, or rhonchi. Breathing is unlabored. Heart: RRR S1 S2 without murmurs, rubs, or gallops.  Abdomen: Soft, non-tender, non-distended with normoactive bowel sounds. No rebound/guarding. Extremities: No clubbing or cyanosis. No edema. Distal pedal pulses are 2+ and equal bilaterally. Neuro: Alert and oriented to self, hospital, 2024.  Psych:  Responds to questions appropriately with a normal  affect.   EKG:  The EKG was personally reviewed and demonstrates:  02/14/23 Narrow complex tachycardia 151bpm with NSIVCD with diffuse ST depression most profound in V2-V6 Admit EKG showed NSR 72bpm, NSIVCD, nonspecific STTW changes. QTc reporting but hand calculated at via [place], by [place]  Telemetry:  Telemetry was personally reviewed and demonstrates:  NSR except for transient SVT earlier this morning  Relevant CV Studies:  Limited echo 11/2022   1. Left ventricular ejection fraction, by estimation, is 50 to 55%. The  left ventricle has low normal function.   2. Right ventricular systolic function is normal. The right ventricular  size is not well visualized. There is normal pulmonary artery systolic  pressure. The estimated right ventricular systolic pressure is 35.5 mmHg.   3. No evidence of mitral valve regurgitation.   4. Mild leaflet thickening/calcification. Tricuspid valve regurgitation  is mild.   5. Aortic valve regurgitation is not visualized.   6. The inferior vena cava is dilated in size with >50% respiratory  variability, suggesting right atrial pressure of 8 mmHg.   Full echo 08/2022  1. Left ventricular ejection fraction, by estimation, is 50%. The left  ventricle has low normal function. The left ventricle has no regional wall  motion abnormalities. There is moderate left ventricular hypertrophy. Left  ventricular diastolic parameters   are consistent with Grade II diastolic dysfunction (pseudonormalization).  Elevated left atrial pressure.   2. Right ventricular systolic function is normal. The right ventricular  size is mildly enlarged. There is severely elevated pulmonary artery  systolic pressure.   3. Left atrial size was severely dilated.   4. Right atrial size was severely dilated.   5. The mitral valve is abnormal. Trivial mitral valve regurgitation.   6. The tricuspid valve is abnormal.   7. The aortic valve has an indeterminant  number of cusps. Aortic valve  regurgitation is not visualized. No aortic stenosis is present.   8. The inferior vena cava is dilated in size with <50% respiratory  variability, suggesting right atrial pressure of 15 mmHg.    Laboratory Data:  High Sensitivity Troponin:  No results for input(s): "TROPONINIHS" in the last 720 hours.   Chemistry Recent Labs  Lab 02/13/23 1129  NA 134*  K 3.6  CL 93*  CO2 28  GLUCOSE 155*  BUN 31*  CREATININE 5.84*  CALCIUM 8.3*  MG 2.1  GFRNONAA 10*  ANIONGAP 13    Recent Labs  Lab 02/13/23 1129  PROT 6.0*  ALBUMIN 2.5*  AST 9*  ALT 6  ALKPHOS 61  BILITOT 1.0   Lipids No results for input(s): "CHOL", "TRIG", "HDL", "LABVLDL", "LDLCALC", "CHOLHDL" in the last 168 hours.  Hematology Recent Labs  Lab 02/13/23 1129  WBC 5.7  RBC 3.92*  HGB 9.3*  HCT 32.0*  MCV 81.6  MCH 23.7*  MCHC 29.1*  RDW 17.1*  PLT 203   Thyroid  Recent Labs  Lab  02/13/23 1129  TSH 4.229    BNP Recent Labs  Lab 02/13/23 1129  BNP 3,500.5*    DDimer No results for input(s): "DDIMER" in the last 168 hours.   Radiology/Studies:  CT HEAD WO CONTRAST ( )  Result Date: 02/13/2023 CLINICAL DATA:  67 year old male with history of altered mental status and somnolence. EXAM: CT HEAD WITHOUT CONTRAST TECHNIQUE: Contiguous axial images were obtained from the base of the skull through the vertex without intravenous contrast. RADIATION DOSE REDUCTION: This exam was performed according to the departmental dose-optimization program which includes automated exposure control, adjustment of the mA and/or kV according to patient size and/or use of iterative reconstruction technique. COMPARISON:  Head CT 09/09/2022. FINDINGS: Brain: Moderate cerebral and mild cerebellar atrophy, advanced for age. Patchy and confluent areas of decreased attenuation are noted throughout the deep and periventricular white matter of the cerebral hemispheres bilaterally, compatible with  chronic microvascular ischemic disease. No evidence of acute infarction, hemorrhage, hydrocephalus, extra-axial collection or mass lesion/mass effect. Vascular: No hyperdense vessel or unexpected calcification. Skull: Normal. Negative for fracture or focal lesion. Sinuses/Orbits: No acute finding. Other: None. IMPRESSION: 1. No acute intracranial abnormalities. 2. Moderate cerebral and mild cerebellar atrophy with extensive chronic microvascular ischemic changes in the cerebral white matter, as above. Electronically Signed   By: Trudie Reed M.D.   On: 02/13/2023 12:41   DG Chest Portable 1 View  Result Date: 02/13/2023 CLINICAL DATA:  Fever and cough.  Shortness of breath.  Fatigue. EXAM: PORTABLE CHEST 1 VIEW COMPARISON:  11/25/2022 FINDINGS: Stable mild cardiomegaly.  Prior CABG again noted. Low lung volumes again demonstrated. Diffuse interstitial infiltrates are likely due to interstitial edema. New small right pleural effusion also noted. IMPRESSION: Diffuse interstitial infiltrates, likely due to interstitial edema/congestive heart failure. New small right pleural effusion. Electronically Signed   By: Danae Orleans M.D.   On: 02/13/2023 12:36     Assessment and Plan:   1. PSVT with long history, difficult to control in the past, borderline QTC - currently on amiodarone 200mg  daily and metoprolol 25mg  BID - has required intermittent uptitrations of amiodarone in the past, need to be cognizant of borderline QT prolongation - per preliminary discussion with Dr. Flora Lipps, give extra dose of amiodarone now then tomorrow increase to 200mg  BID and get limited echo   2. Fever, acute metabolic encephalopathy with recent Covid-19 infection admitted with possible superimposed HCAP/pneumonitis versus a/c combined CHF in setting of missed HD session - per medicine - EF 50% by last echocardiogram - volume managed by nephrology  3. CAD s/p CABG - no acute concerns - per chart review ASA was discontinued  by IM team at discharge 09/2022 during admission for hip fracture but do not see specific mention of reason/plan to resume, will defer timing of suitability for resumption to medicine team, has downtrending Hgb currently  4. Anemia - per IM team  Risk Assessment/Risk Scores:        New York Heart Association (NYHA) Functional Class NYHA Class III on admission  For questions or updates, please contact [facility] Please consult [email or web address] for contact info under    Signed, [name], PA-C  02/14/2023 12:33 PM

## 2023-02-14 NOTE — Progress Notes (Signed)
Pt receives out-pt HD at [facility] on MWF 11:30 am chair time. Will assist as needed.   Olivia Canter Renal Navigator [phone]

## 2023-02-14 NOTE — Care Management Obs Status (Signed)
MEDICARE OBSERVATION STATUS NOTIFICATION   Patient Details  Name: TRENDON NAIMI MRN: [id number] Date of Birth: [birth year]   Medicare Observation Status Notification Given:  Yes    Gordy Clement, RN 02/14/2023, 3:25 PM

## 2023-02-14 NOTE — Progress Notes (Signed)
   02/14/23 0020  Vitals  BP (!) 112/52  MAP (mmHg) 72  BP Location Right Arm  BP Method Automatic  Patient Position (if appropriate) Lying  Pulse Rate 80  Pulse Rate Source Monitor  ECG Heart Rate 81  Resp 18  Oxygen Therapy  SpO2 94 %  O2 Device Nasal Cannula  O2 Flow Rate (L/min) 3 L/min  During Treatment Monitoring  Blood Flow Rate (mL/min) 0 mL/min  Arterial Pressure (mmHg) 39.39 mmHg  Venous Pressure (mmHg) -34.54 mmHg  TMP (mmHg) 20 mmHg  Ultrafiltration Rate (mL/min) 1100 mL/min  Dialysate Flow Rate (mL/min) 299 ml/min  Dialysate Potassium Concentration 3  Dialysate Calcium Concentration 2.5  Duration of HD Treatment -hour(s) 3.5 hour(s)  Cumulative Fluid Removed (mL) per Treatment  2700.18  HD Safety Checks Performed Yes  Intra-Hemodialysis Comments Tx completed  Post Treatment  Dialyzer Clearance Lightly streaked  Liters Processed 84  Fluid Removed (mL) 2700 mL  Tolerated HD Treatment Yes  Post-Hemodialysis Comments Pt states feeling better  AVG/AVF Arterial Site Held (minutes) 8 minutes  AVG/AVF Venous Site Held (minutes) 8 minutes  Note  Patient Observations Pt stable   Pt return to his room.Report to Emelda Fear RN

## 2023-02-14 NOTE — Progress Notes (Signed)
[facility] Progress Note   Subjective:   Noted episode of SVT overnight, required adenosine. Today, pt is alert but slow to respond to questions. He reports SOB and cough. Denies CP, dizziness, nausea.  Objective Vitals:   02/14/23 0119 02/14/23 0545 02/14/23 0900 02/14/23 1100  BP: (!) 118/52 (!) 129/59 136/62 (!) 142/65  Pulse:  82 75 77  Resp:  15 16 19   Temp:  98.7 F (37.1 C) 97.6 F (36.4 C)   TempSrc:  Oral    SpO2: 98% 100% 99%   Weight:      Height:       Physical Exam General: Alert male in NAD Heart: RRR, no murmurs, rubs or gallops Lungs: Poor cooperation with exam, lungs sounds diminished at bases. No rales auscultated, respirations unlabored Abdomen: Soft, non-tender, non-distended, +BS Extremities: No edema b/l lower extremities Dialysis Access: LUE AVF + t/b  Additional Objective Labs: Basic Metabolic Panel: Recent Labs  Lab 02/13/23 1129  NA 134*  K 3.6  CL 93*  CO2 28  GLUCOSE 155*  BUN 31*  CREATININE 5.84*  CALCIUM 8.3*   Liver Function Tests: Recent Labs  Lab 02/13/23 1129  AST 9*  ALT 6  ALKPHOS 61  BILITOT 1.0  PROT 6.0*  ALBUMIN 2.5*   Recent Labs  Lab 02/13/23 1129  LIPASE 31   CBC: Recent Labs  Lab 02/13/23 1129  WBC 5.7  NEUTROABS 4.5  HGB 9.3*  HCT 32.0*  MCV 81.6  PLT 203   Blood Culture    Component Value Date/Time   SDES BLOOD SITE NOT SPECIFIED 02/13/2023 1135   SPECREQUEST  02/13/2023 1135    BOTTLES DRAWN AEROBIC AND ANAEROBIC Blood Culture results may not be optimal due to an inadequate volume of blood received in culture bottles   CULT  02/13/2023 1135    NO GROWTH < 24 HOURS Performed at [facility], [address] [place], [place] [address]    REPTSTATUS PENDING 02/13/2023 1135    Cardiac Enzymes: No results for input(s): "CKTOTAL", "CKMB", "CKMBINDEX", "TROPONINI" in the last 168 hours. CBG: Recent Labs  Lab 02/13/23 1757 02/14/23 0936 02/14/23 1248  GLUCAP 140*  136* 186*   Iron Studies: No results for input(s): "IRON", "TIBC", "TRANSFERRIN", "FERRITIN" in the last 72 hours. @lablastinr3 @ Studies/Results: CT HEAD WO CONTRAST ( )  Result Date: 02/13/2023 CLINICAL DATA:  67 year old male with history of altered mental status and somnolence. EXAM: CT HEAD WITHOUT CONTRAST TECHNIQUE: Contiguous axial images were obtained from the base of the skull through the vertex without intravenous contrast. RADIATION DOSE REDUCTION: This exam was performed according to the departmental dose-optimization program which includes automated exposure control, adjustment of the mA and/or kV according to patient size and/or use of iterative reconstruction technique. COMPARISON:  Head CT 09/09/2022. FINDINGS: Brain: Moderate cerebral and mild cerebellar atrophy, advanced for age. Patchy and confluent areas of decreased attenuation are noted throughout the deep and periventricular white matter of the cerebral hemispheres bilaterally, compatible with chronic microvascular ischemic disease. No evidence of acute infarction, hemorrhage, hydrocephalus, extra-axial collection or mass lesion/mass effect. Vascular: No hyperdense vessel or unexpected calcification. Skull: Normal. Negative for fracture or focal lesion. Sinuses/Orbits: No acute finding. Other: None. IMPRESSION: 1. No acute intracranial abnormalities. 2. Moderate cerebral and mild cerebellar atrophy with extensive chronic microvascular ischemic changes in the cerebral white matter, as above. Electronically Signed   By: Trudie Reed M.D.   On: 02/13/2023 12:41   DG Chest Portable 1 View  Result Date: 02/13/2023 CLINICAL DATA:  Fever and cough.  Shortness of breath.  Fatigue. EXAM: PORTABLE CHEST 1 VIEW COMPARISON:  11/25/2022 FINDINGS: Stable mild cardiomegaly.  Prior CABG again noted. Low lung volumes again demonstrated. Diffuse interstitial infiltrates are likely due to interstitial edema. New small right pleural effusion also  noted. IMPRESSION: Diffuse interstitial infiltrates, likely due to interstitial edema/congestive heart failure. New small right pleural effusion. Electronically Signed   By: Danae Orleans M.D.   On: 02/13/2023 12:36   Medications:   adenosine       amiodarone  200 mg Oral Daily   Chlorhexidine Gluconate Cloth  6 each Topical Q0600   [START ON 02/15/2023] fentaNYL  1 patch Transdermal Q72H   heparin  5,000 Units Subcutaneous Q8H   insulin aspart  0-6 Units Subcutaneous TID WC   levothyroxine  25 mcg Oral Daily   metoprolol tartrate  25 mg Oral BID   pantoprazole  40 mg Oral BID   sertraline  100 mg Oral Daily   sodium chloride flush  3 mL Intravenous Q12H    OP Dialysis Orders: [facility] MWF 180NRe EDW 80kg BFR 400 DFR 600 2K 2.5Ca  AVF 15g  Heparin 5000 unit bolus Mircera IV q 2 weeks- last dose 02/03/23 Venofer 100mg  IV q HD- not started yet  Assessment/Plan:  Acute encephalopathy/fever: suspected PNA. Patient with COVID 19 about 2 weeks ago but worsening fatigue/somnolence over the past two days. Infection work up in progress per admitting team  ESRD:  On MWF schedule, went to HD Friday but missed Wednesday.  Now back on schedule, next HD tomorrow.   Hypertension/volume: BP controlled but edema on CXR. He has been losing weight and likely needs his EDW lowered further. Tolerated UF 2.7L overnight but developed SVT a few hours later, lower goal to 2L for tomorrow.   Anemia: Hgb 9.3, recently received ESA. IV iron was ordered outpatient but will hold for now d/t possible infection  Metabolic bone disease: Calcium controlled. Will check phos with next labs. Resume binders once tolerating PO  Nutrition:  Poor PO intake lately, low albumin, with recent hospitalization and COVID infection. Start protein supplements once tolerating PO Hx SVT: Hospitalized in June 2024. On amiodarone and metoprolol. Developed SVT again early this AM. Cardiology following.      Rogers Blocker,  PA-C 02/14/2023, 12:57 PM  [facility] Pager: [phone]
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
## 2023-02-14 NOTE — Plan of Care (Signed)
  Problem: Education: Goal: Ability to demonstrate management of disease process will improve Outcome: Progressing   Problem: Activity: Goal: Capacity to carry out activities will improve Outcome: Progressing   Problem: Cardiac: Goal: Ability to achieve and maintain adequate cardiopulmonary perfusion will improve Outcome: Progressing   Problem: Education: Goal: Knowledge of General Education information will improve Description: Including pain rating scale, medication(s)/side effects and non-pharmacologic comfort measures Outcome: Progressing   Problem: Clinical Measurements: Goal: Ability to maintain clinical measurements within normal limits will improve Outcome: Progressing Goal: Respiratory complications will improve Outcome: Progressing Goal: Cardiovascular complication will be avoided Outcome: Progressing   Problem: Activity: Goal: Risk for activity intolerance will decrease Outcome: Progressing

## 2023-02-14 NOTE — Progress Notes (Signed)
OT Cancellation Note  Patient Details Name: [name] MRN: [id number] DOB: 1956-02-15   Cancelled Treatment:    Reason Eval/Treat Not Completed: Patient at procedure or test/ unavailable. Pt currently out  of room for echocardiogram.  Lindon Romp OT Acute Rehabilitation Services Office [phone]    Evette Georges 02/14/2023, 2:37 PM

## 2023-02-14 NOTE — Telephone Encounter (Signed)
Patient currently admitted to [facility] hospital

## 2023-02-14 NOTE — Plan of Care (Signed)
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
## 2023-02-14 NOTE — Significant Event (Addendum)
Rapid Response Event Note   Asked to assist with pushing Adenosine on pt in SVT-150s, SBP-60s. Pt with decreased MS, cool clammy on my arrival. EKG showing ST.  Zoll pads placed and pt connected to zoll defib. 6mg  adenosine pushed at 0428 with no change in HR or BP. 12mg  adenosive given at 0433 with HR dropping to 80s, SBP-110s, and improved MS. Please call RRT if further assistance needed.     Terrilyn Saver, RN

## 2023-02-15 ENCOUNTER — Observation Stay (HOSPITAL_COMMUNITY): Payer: Medicare Other

## 2023-02-15 DIAGNOSIS — G9341 Metabolic encephalopathy: Secondary | ICD-10-CM | POA: Diagnosis not present

## 2023-02-15 DIAGNOSIS — I471 Supraventricular tachycardia, unspecified: Secondary | ICD-10-CM | POA: Diagnosis not present

## 2023-02-15 DIAGNOSIS — I10 Essential (primary) hypertension: Secondary | ICD-10-CM | POA: Diagnosis not present

## 2023-02-15 DIAGNOSIS — I5042 Chronic combined systolic (congestive) and diastolic (congestive) heart failure: Secondary | ICD-10-CM | POA: Diagnosis not present

## 2023-02-15 DIAGNOSIS — G9340 Encephalopathy, unspecified: Secondary | ICD-10-CM | POA: Diagnosis not present

## 2023-02-15 DIAGNOSIS — I5021 Acute systolic (congestive) heart failure: Secondary | ICD-10-CM

## 2023-02-15 LAB — GLUCOSE, CAPILLARY
Glucose-Capillary: 191 mg/dL — ABNORMAL HIGH (ref 70–99)
Glucose-Capillary: 140 mg/dL — ABNORMAL HIGH (ref 70–99)
Glucose-Capillary: 139 mg/dL — ABNORMAL HIGH (ref 70–99)

## 2023-02-15 LAB — RENAL FUNCTION PANEL
Albumin: 2.5 g/dL — ABNORMAL LOW (ref 3.5–5.0)
Anion gap: 16 — ABNORMAL HIGH (ref 5–15)
BUN: 23 mg/dL (ref 8–23)
CO2: 26 mmol/L (ref 22–32)
Calcium: 8.1 mg/dL — ABNORMAL LOW (ref 8.9–10.3)
Chloride: 92 mmol/L — ABNORMAL LOW (ref 98–111)
Creatinine, Ser: 4.61 mg/dL — ABNORMAL HIGH (ref 0.61–1.24)
GFR, Estimated: 13 mL/min — ABNORMAL LOW (ref >60)
Glucose, Bld: 128 mg/dL — ABNORMAL HIGH (ref 70–99)
Phosphorus: 2.5 mg/dL (ref 2.5–4.6)
Potassium: 3.6 mmol/L (ref 3.5–5.1)
Sodium: 134 mmol/L — ABNORMAL LOW (ref 135–145)

## 2023-02-15 LAB — CBC
HCT: 32.7 % — ABNORMAL LOW (ref 39.0–52.0)
Hemoglobin: 9.8 g/dL — ABNORMAL LOW (ref 13.0–17.0)
MCH: 23.8 pg — ABNORMAL LOW (ref 26.0–34.0)
MCHC: 30 g/dL (ref 30.0–36.0)
MCV: 79.4 fL — ABNORMAL LOW (ref 80.0–100.0)
Platelets: 217 10*3/uL (ref 150–400)
RBC: 4.12 MIL/uL — ABNORMAL LOW (ref 4.22–5.81)
RDW: 16.8 % — ABNORMAL HIGH (ref 11.5–15.5)
WBC: 6 10*3/uL (ref 4.0–10.5)
nRBC: 0 % (ref 0.0–0.2)

## 2023-02-15 LAB — C-REACTIVE PROTEIN: CRP: 12.6 mg/dL — ABNORMAL HIGH (ref <1.0)

## 2023-02-15 LAB — HEPATITIS B SURFACE ANTIBODY, QUANTITATIVE: Hep B S AB Quant (Post): 68.3 m[IU]/mL

## 2023-02-15 MED ORDER — METOPROLOL SUCCINATE ER 25 MG PO TB24: 25.0000 mg | ORAL_TABLET | Freq: Every day | ORAL | Status: DC

## 2023-02-15 MED ORDER — HYDRALAZINE HCL 25 MG PO TABS
25.0000 mg | ORAL_TABLET | Freq: Three times a day (TID) | ORAL | Status: DC
  Administered 2023-02-15 – 2023-02-16 (×3): 25 mg via ORAL
  Filled 2023-02-15 (×3): qty 1

## 2023-02-15 MED ORDER — ISOSORBIDE MONONITRATE ER 30 MG PO TB24
30.0000 mg | ORAL_TABLET | Freq: Every day | ORAL | Status: DC
  Administered 2023-02-15 – 2023-02-16 (×2): 30 mg via ORAL
  Filled 2023-02-15 (×2): qty 1

## 2023-02-15 MED ORDER — HEPARIN SODIUM (PORCINE) 1000 UNIT/ML DIALYSIS
2000.0000 [IU] | Freq: Once | INTRAMUSCULAR | Status: AC
  Administered 2023-02-15: 2000 [IU] via INTRAVENOUS_CENTRAL
  Filled 2023-02-15: qty 2

## 2023-02-15 NOTE — Plan of Care (Signed)

## 2023-02-15 NOTE — Progress Notes (Signed)
HD Progress Note:   02/15/23 1836  Vitals  Temp 98.5 F (36.9 C)  Temp Source Oral  BP 133/61  MAP (mmHg) 83  BP Location Right Arm  BP Method Automatic  Patient Position (if appropriate) Lying  Pulse Rate 76  Pulse Rate Source Monitor  ECG Heart Rate 76  Resp 16  Oxygen Therapy  SpO2 97 %  O2 Device Nasal Cannula  O2 Flow Rate (L/min) 1 L/min  During Treatment Monitoring  HD Safety Checks Performed Yes  Intra-Hemodialysis Comments Tx completed;Tolerated well  Dialysis Fluid Bolus Normal Saline  Bolus Amount (mL) 300 mL  Post Treatment  Dialyzer Clearance Lightly streaked  Hemodialysis Intake (mL) 0 mL  Liters Processed 84  Fluid Removed (mL) 2000 mL  Tolerated HD Treatment Yes  Post-Hemodialysis Comments Tolerated fairly well, HR stable, denies complains.  AVG/AVF Arterial Site Held (minutes) 10 minutes  AVG/AVF Venous Site Held (minutes) 10 minutes   Tolerated HD, without any acute issues/complications.
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
## 2023-02-15 NOTE — Progress Notes (Signed)
PROGRESS NOTE        PATIENT DETAILS Name: [name] Age: 67 y.o. Sex: male Date of Birth: May 11, 1956 Admit Date: 02/13/2023 Admitting Physician Synetta Fail, MD EXB:[id number], Jonna Coup, MD  Brief Summary: Patient is a 67 y.o.  male with history of ESRD, SVT, HTN, HLD, Barrett's esophagus/esophageal cancer-who is he had COVID-19 infection approximately 2 weeks prior to this hospitalization-presented from his SNF with fatigue/lethargy/fever of 102F-chest x-ray showed diffuse interstitial infiltrates-concerning for CHF-he was subsequently admitted to the hospitalist service.   Significant events: 9/2>> from SNF-fatigue/fever/diffuse infiltrates on CXR-concern for CHF-nephrology consulted-underwent HD-admit to [facility]. 9/3>> SVT with hypotension-required 12 mg of adenosine to restore sinus rhythm.  Significant studies: 9/2>> CXR: Diffuse interstitial infiltrates 9/2>> CT head: No acute abnormalities 9/3>> echo: EF 35-40% 9/3>> CXR: Improved pulmonary edema  Significant microbiology data: 9/2>> blood culture: No growth 9/2>> influenza/RSV PCR: Negative 9/2>> COVID PCR:+ve  Procedures: None none  Consults: None  Subjective: Much more cooperative to exam today-answers questions appropriately.  Remains on 2 L of oxygen.  Afebrile overnight.  Objective: Vitals: Blood pressure 134/61, pulse 80, temperature 97.7 F (36.5 C), temperature source Oral, resp. rate 16, height 5\' 10"  (1.778 m), weight 81.6 kg, SpO2 96%.   Exam: Gen Exam:Alert awake-not in any distress HEENT:atraumatic, normocephalic Chest: B/L clear to auscultation anteriorly CVS:S1S2 regular Abdomen:soft non tender, non distended Extremities:no edema Neurology: Non focal Skin: no rash  Pertinent Labs/Radiology:    Latest Ref Rng & Units 02/15/2023    7:03 AM 02/14/2023    1:00 PM 02/13/2023   11:29 AM  CBC  WBC 4.0 - 10.5 K/uL 6.0  6.3  5.7   Hemoglobin 13.0 - 17.0 g/dL 9.8  44.0   9.3   Hematocrit 39.0 - 52.0 % 32.7  33.5  32.0   Platelets 150 - 400 K/uL 217  181  203     Lab Results  Component Value Date   NA 134 (L) 02/15/2023   K 3.6 02/15/2023   CL 92 (L) 02/15/2023   CO2 26 02/15/2023     Assessment/Plan: Acute metabolic encephalopathy Recent COVID-19 infection Metabolic encephalopathy likely due to missed HD/and recent COVID-19 infection Clinically improved-completely awake and alert this morning.  Acute pulmonary edema in the setting of acute on chronic HFrEF and missed hemodialysis Clinical and radiological improvement (repeat x-ray on 9/3 with parenchymal clearing) after HD  Not felt to have PNA-ruled out Nephrology planning another HD session today Echo with drop in [facility] cardiology recommendations regarding possible LHC or medical management.  Already on beta-blocker-will defer ARB/ARNI use to cardiology service.    SVT Stable overnight-maintaining sinus rhythm Amiodarone dosage changed to twice daily On beta-blocker Telemetry monitoring.   ESRD on HD MWF Nephrology following  Normocytic anemia Secondary to ESRD Iron/Aranesp deferred to nephrology service  History of CAD s/p CABG 2020 History of CVA Stable Aspirin/beta-blocker.  Resume Repatha as an outpatient  HTN BP stable Metoprolol  HLD Repatha as outpatient  History of Barrett's esophagus/esophageal adenocarcinoma-follows with [facility] oncology GERD/dysphagia PPI  Appreciate SLP eval-dysphagia 3 diet.  Depression Zoloft  OSA Per chart/H&P-no longer uses CPAP  BMI: Estimated body mass index is 25.81 kg/m as calculated from the following:   Height as of this encounter: 5\' 10"  (1.778 m).   Weight as of this encounter: 81.6 kg.   Code status:  Code Status: Do not attempt resuscitation (DNR) PRE-ARREST INTERVENTIONS DESIRED   DVT Prophylaxis: heparin injection 5,000 Units Start: 02/13/23 1515   Family Communication: Sister-Amy (ED physician)-[phone]  updated 9/4   Disposition Plan: Status is: Observation The patient will require care spanning > 2 midnights and should be moved to inpatient because: Severity of illness   Planned Discharge Destination:Skilled nursing facility   Diet: Diet Order             DIET DYS 3 Room service appropriate? Yes with Assist; Fluid consistency: Thin  Diet effective now                     Antimicrobial agents: Anti-infectives (From admission, onward)    Start     Dose/Rate Route Frequency Ordered Stop   02/13/23 1430  cefTRIAXone (ROCEPHIN) 1 g in sodium chloride 0.9 % 100 mL IVPB        1 g 200 mL/hr over 30 Minutes Intravenous  Once 02/13/23 1425 02/13/23 1629   02/13/23 1430  azithromycin (ZITHROMAX) 500 mg in sodium chloride 0.9 % 250 mL IVPB        500 mg 250 mL/hr over 60 Minutes Intravenous  Once 02/13/23 1425 02/13/23 1725        MEDICATIONS: Scheduled Meds:  amiodarone  200 mg Oral BID   Chlorhexidine Gluconate Cloth  6 each Topical Q0600   fentaNYL  1 patch Transdermal Q72H   heparin  5,000 Units Subcutaneous Q8H   insulin aspart  0-6 Units Subcutaneous TID WC   levothyroxine  25 mcg Oral Daily   metoprolol tartrate  25 mg Oral BID   pantoprazole  40 mg Oral BID   sertraline  100 mg Oral Daily   sodium chloride flush  3 mL Intravenous Q12H   Continuous Infusions: PRN Meds:.acetaminophen **OR** acetaminophen, polyethylene glycol   I have personally reviewed following labs and imaging studies  LABORATORY DATA: CBC: Recent Labs  Lab 02/13/23 1129 02/14/23 1300 02/15/23 0703  WBC 5.7 6.3 6.0  NEUTROABS 4.5  --   --   HGB 9.3* 10.0* 9.8*  HCT 32.0* 33.5* 32.7*  MCV 81.6 78.8* 79.4*  PLT 203 181 217    Basic Metabolic Panel: Recent Labs  Lab 02/13/23 1129 02/14/23 1300 02/15/23 0703  NA 134* 135 134*  K 3.6 3.9 3.6  CL 93* 93* 92*  CO2 28 26 26   GLUCOSE 155* 180* 128*  BUN 31* 19 23  CREATININE 5.84* 3.93* 4.61*  CALCIUM 8.3* 8.3* 8.1*  MG  2.1  --   --   PHOS  --   --  2.5    GFR: Estimated Creatinine Clearance: 16.3 mL/min (A) (by C-G formula based on SCr of 4.61 mg/dL (H)).  Liver Function Tests: Recent Labs  Lab 02/13/23 1129 02/14/23 1300 02/15/23 0703  AST 9* 9*  --   ALT 6 7  --   ALKPHOS 61 69  --   BILITOT 1.0 0.9  --   PROT 6.0* 5.8*  --   ALBUMIN 2.5* 2.5* 2.5*   Recent Labs  Lab 02/13/23 1129  LIPASE 31   Recent Labs  Lab 02/13/23 1652  AMMONIA 17    Coagulation Profile: No results for input(s): "INR", "PROTIME" in the last 168 hours.  Cardiac Enzymes: No results for input(s): "CKTOTAL", "CKMB", "CKMBINDEX", "TROPONINI" in the last 168 hours.  BNP (last 3 results) No results for input(s): "PROBNP" in the last 8760 hours.  Lipid Profile: No results  for input(s): "CHOL", "HDL", "LDLCALC", "TRIG", "CHOLHDL", "LDLDIRECT" in the last 72 hours.  Thyroid Function Tests: Recent Labs    02/13/23 1129  TSH 4.229    Anemia Panel: No results for input(s): "VITAMINB12", "FOLATE", "FERRITIN", "TIBC", "IRON", "RETICCTPCT" in the last 72 hours.  Urine analysis:    Component Value Date/Time   COLORURINE AMBER (A) 02/13/2023 1356   APPEARANCEUR CLEAR 02/13/2023 1356   LABSPEC 1.015 02/13/2023 1356   PHURINE 7.0 02/13/2023 1356   GLUCOSEU NEGATIVE 02/13/2023 1356   HGBUR NEGATIVE 02/13/2023 1356   BILIRUBINUR NEGATIVE 02/13/2023 1356   KETONESUR NEGATIVE 02/13/2023 1356   PROTEINUR >=300 (A) 02/13/2023 1356   NITRITE NEGATIVE 02/13/2023 1356   LEUKOCYTESUR NEGATIVE 02/13/2023 1356    Sepsis Labs: Lactic Acid, Venous    Component Value Date/Time   LATICACIDVEN 1.2 02/13/2023 1652    MICROBIOLOGY: Recent Results (from the past 240 hour(s))  Blood culture (routine x 2)     Status: None (Preliminary result)   Collection Time: 02/13/23 11:30 AM   Specimen: BLOOD  Result Value Ref Range Status   Specimen Description BLOOD SITE NOT SPECIFIED  Final   Special Requests   Final     BOTTLES DRAWN AEROBIC AND ANAEROBIC Blood Culture results may not be optimal due to an inadequate volume of blood received in culture bottles   Culture   Final    NO GROWTH 2 DAYS Performed at [facility], [address], [place], [place] [address]    Report Status PENDING  Incomplete  Blood culture (routine x 2)     Status: None (Preliminary result)   Collection Time: 02/13/23 11:35 AM   Specimen: BLOOD  Result Value Ref Range Status   Specimen Description BLOOD SITE NOT SPECIFIED  Final   Special Requests   Final    BOTTLES DRAWN AEROBIC AND ANAEROBIC Blood Culture results may not be optimal due to an inadequate volume of blood received in culture bottles   Culture   Final    NO GROWTH 2 DAYS Performed at [facility], [address] [place], [place] [address]    Report Status PENDING  Incomplete  Resp panel by RT-PCR (RSV, Flu A&B, Covid) Peripheral     Status: Abnormal   Collection Time: 02/13/23 12:01 PM   Specimen: Peripheral; Nasal Swab  Result Value Ref Range Status   SARS Coronavirus 2 by RT PCR POSITIVE (A) NEGATIVE Final   Influenza A by PCR NEGATIVE NEGATIVE Final   Influenza B by PCR NEGATIVE NEGATIVE Final    Comment: (NOTE) The Xpert Xpress SARS-CoV-2/FLU/RSV plus assay is intended as an aid in the diagnosis of influenza from Nasopharyngeal swab specimens and should not be used as a sole basis for treatment. Nasal washings and aspirates are unacceptable for Xpert Xpress SARS-CoV-2/FLU/RSV testing.  Fact Sheet for Patients: BloggerCourse.com  Fact Sheet for Healthcare Providers: SeriousBroker.it  This test is not yet approved or cleared by the [place] FDA and has been authorized for detection and/or diagnosis of SARS-CoV-2 by FDA under an Emergency Use Authorization (EUA). This EUA will remain in effect (meaning this test can be used) for the duration of the COVID-19 declaration under Section  564(b)(1) of the Act, 21 U.S.C. section 360bbb-3(b)(1), unless the authorization is terminated or revoked.     Resp Syncytial Virus by PCR NEGATIVE NEGATIVE Final    Comment: (NOTE) Fact Sheet for Patients: BloggerCourse.com  Fact Sheet for Healthcare Providers: SeriousBroker.it  This test is not  yet approved or cleared by the [place] and has been authorized for detection and/or diagnosis of SARS-CoV-2 by FDA under an Emergency Use Authorization (EUA). This EUA will remain in effect (meaning this test can be used) for the duration of the COVID-19 declaration under Section 564(b)(1) of the Act, 21 U.S.C. section 360bbb-3(b)(1), unless the authorization is terminated or revoked.  Performed at [facility], [address], [place], [place] [address]     RADIOLOGY STUDIES/RESULTS: DG Chest Port 1 View  Result Date: 02/15/2023 CLINICAL DATA:  Shortness of breath. EXAM: PORTABLE CHEST 1 VIEW COMPARISON:  02/14/2023 and chest CT 11/25/2022 FINDINGS: Persistent densities at the right lung base are most compatible with pleural fluid. Suspect consolidation or atelectasis associated with the pleural fluid. Prominent central vascular markings bilaterally. Hazy lung densities, right side greater than left. Heart size is stable with median sternotomy wires and post CABG changes. IMPRESSION: 1. Hazy parenchymal lung densities bilaterally, right side greater than left. Findings may represent asymmetric pulmonary edema. Right lung densities have slightly increased from the recent comparison examination. 2. Persistent right pleural effusion with right basilar atelectasis or consolidation. Electronically Signed   By: Richarda Overlie M.D.   On: 02/15/2023 07:59   DG Chest Port 1V same Day  Result Date: 02/14/2023 CLINICAL DATA:  Weakness and shortness of breath. EXAM: PORTABLE CHEST 1 VIEW COMPARISON:  Radiograph yesterday. FINDINGS: Improvement  in pulmonary edema from prior. Right pleural effusion has increased. Stable cardiomegaly. Prior median sternotomy. Ill-defined opacity at the left lung base, favor atelectasis. No pneumothorax. IMPRESSION: 1. Improvement in pulmonary edema. 2. Increased right pleural effusion. 3. Stable cardiomegaly. Electronically Signed   By: Narda Rutherford M.D.   On: 02/14/2023 17:34   ECHOCARDIOGRAM LIMITED  Result Date: 02/14/2023    ECHOCARDIOGRAM LIMITED REPORT   Patient Name:   BASIT TEAKELL Date of Exam: 02/14/2023 Medical Rec #:  [id number]        Height:       70.0 in Accession #:    [id number]       Weight:       179.9 lb Date of Birth:  Nov 24, 1955        BSA:          1.995 m Patient Age:    67 years         BP:           151/70 mmHg Patient Gender: M                HR:           72 bpm. Exam Location:  Inpatient Procedure: Limited Echo, Limited Color Doppler and Cardiac Doppler Indications:    chf  History:        Patient has prior history of Echocardiogram examinations, most                 recent 11/26/2022. CHF and Cardiomyopathy, CAD; Risk                 Factors:Hypertension and Dyslipidemia.  Sonographer:    Melissa Morford RDCS (AE, PE) Referring Phys: [id number] DAYNA N DUNN IMPRESSIONS  1. Left ventricular ejection fraction, by estimation, is 35 to 40%. The left ventricle has moderately decreased function. The left ventricle demonstrates global hypokinesis. There is moderate concentric left ventricular hypertrophy.  2. Right ventricular systolic function is moderately reduced. The right ventricular size is moderately enlarged.  3. Left atrial size was severely dilated.  4. Right atrial size was severely dilated.  5. Trivial mitral valve regurgitation. No evidence of mitral stenosis. Moderate to severe mitral annular calcification.  6. The aortic valve is tricuspid. Aortic valve regurgitation is not visualized. No aortic stenosis is present. Comparison(s): Prior images reviewed side by side. The left ventricular  function is worsened. The left ventricular wall motion abnormality is unchanged. On direct review of the images, a similar inferobasal wall motion abnormality has been present since February 2023 (older images are archived). However, there does appear to be worse inferolateral contractility. Right ventricular dysfunction is also not a new finding. FINDINGS  Left Ventricle: Left ventricular ejection fraction, by estimation, is 35 to 40%. The left ventricle has moderately decreased function. The left ventricle demonstrates global hypokinesis. The left ventricular internal cavity size was normal in size. There is moderate concentric left ventricular hypertrophy.  LV Wall Scoring: The posterior wall and basal inferior segment are akinetic. The entire anterior wall, antero-lateral wall, entire septum, entire apex, and mid and distal inferior wall are hypokinetic. Right Ventricle: The right ventricular size is moderately enlarged. Right ventricular systolic function is moderately reduced. Left Atrium: Left atrial size was severely dilated. Right Atrium: Right atrial size was severely dilated. Pericardium: There is no evidence of pericardial effusion. Mitral Valve: Moderate to severe mitral annular calcification. Trivial mitral valve regurgitation. No evidence of mitral valve stenosis. Tricuspid Valve: The tricuspid valve is normal in structure. Tricuspid valve regurgitation is not demonstrated. Aortic Valve: The aortic valve is tricuspid. Aortic valve regurgitation is not visualized. No aortic stenosis is present. Pulmonic Valve: The pulmonic valve was normal in structure. Pulmonic valve regurgitation is not visualized. Aorta: The aortic root and ascending aorta are structurally normal, with no evidence of dilitation. LEFT VENTRICLE PLAX 2D LVIDd:         6.00 cm LVIDs:         4.86 cm LV PW:         1.40 cm LV IVS:        1.40 cm  LEFT ATRIUM         Index LA diam:    4.90 cm 2.46 cm/m Rachelle Hora Croitoru MD Electronically  signed by Thurmon Fair MD Signature Date/Time: 02/14/2023/5:01:35 PM    Final    CT HEAD WO CONTRAST ( )  Result Date: 02/13/2023 CLINICAL DATA:  67 year old male with history of altered mental status and somnolence. EXAM: CT HEAD WITHOUT CONTRAST TECHNIQUE: Contiguous axial images were obtained from the base of the skull through the vertex without intravenous contrast. RADIATION DOSE REDUCTION: This exam was performed according to the departmental dose-optimization program which includes automated exposure control, adjustment of the mA and/or kV according to patient size and/or use of iterative reconstruction technique. COMPARISON:  Head CT 09/09/2022. FINDINGS: Brain: Moderate cerebral and mild cerebellar atrophy, advanced for age. Patchy and confluent areas of decreased attenuation are noted throughout the deep and periventricular white matter of the cerebral hemispheres bilaterally, compatible with chronic microvascular ischemic disease. No evidence of acute infarction, hemorrhage, hydrocephalus, extra-axial collection or mass lesion/mass effect. Vascular: No hyperdense vessel or unexpected calcification. Skull: Normal. Negative for fracture or focal lesion. Sinuses/Orbits: No acute finding. Other: None. IMPRESSION: 1. No acute intracranial abnormalities. 2. Moderate cerebral and mild cerebellar atrophy with extensive chronic microvascular ischemic changes in the cerebral white matter, as above. Electronically Signed   By: Trudie Reed M.D.   On: 02/13/2023 12:41   DG Chest Portable 1 View  Result Date: 02/13/2023 CLINICAL DATA:  Fever and cough.  Shortness of breath.  Fatigue. EXAM: PORTABLE CHEST 1 VIEW COMPARISON:  11/25/2022 FINDINGS: Stable mild cardiomegaly.  Prior CABG again noted. Low lung volumes again demonstrated. Diffuse interstitial infiltrates are likely due to interstitial edema. New small right pleural effusion also noted. IMPRESSION: Diffuse interstitial infiltrates, likely due to  interstitial edema/congestive heart failure. New small right pleural effusion. Electronically Signed   By: Danae Orleans M.D.   On: 02/13/2023 12:36     LOS: 0 days   Jeoffrey Massed, MD  [facility]    To contact the attending provider between 7A-7P or the covering provider during after hours 7P-7A, please log into the web site [email or web address] and access using universal  password for that web site. If you do not have the password, please call the hospital operator.  02/15/2023, 9:33 AM

## 2023-02-15 NOTE — Progress Notes (Signed)
Progress Note  Patient Name: [name] Date of Encounter: 02/15/2023  Primary Cardiologist: Chilton Si, MD  Subjective   Denies CP, SOB. Telemetry NSR. No palpitations or breakthrough SVT.  Inpatient Medications    Scheduled Meds:  amiodarone  200 mg Oral BID   Chlorhexidine Gluconate Cloth  6 each Topical Q0600   fentaNYL  1 patch Transdermal Q72H   heparin  5,000 Units Subcutaneous Q8H   insulin aspart  0-6 Units Subcutaneous TID WC   levothyroxine  25 mcg Oral Daily   metoprolol tartrate  25 mg Oral BID   pantoprazole  40 mg Oral BID   sertraline  100 mg Oral Daily   sodium chloride flush  3 mL Intravenous Q12H   Continuous Infusions:  PRN Meds: acetaminophen **OR** acetaminophen, polyethylene glycol   Vital Signs    Vitals:   02/15/23 0000 02/15/23 0400 02/15/23 0543 02/15/23 0841  BP: 134/63 (!) 141/76 (!) 148/66 134/61  Pulse: 70 72 72 80  Resp: 17 19 16    Temp: 98.3 F (36.8 C) 98.1 F (36.7 C) 97.7 F (36.5 C)   TempSrc:  Oral Oral   SpO2: 96% 98% 96%   Weight:      Height:       No intake or output data in the 24 hours ending 02/15/23 0845    02/13/2023   11:27 AM 12/27/2022    1:33 PM 11/28/2022   11:24 AM  Last 3 Weights  Weight (lbs) 179 lb 14.3 oz 180 lb 174 lb 13.2 oz  Weight (kg) 81.6 kg 81.647 kg 79.3 kg     Telemetry    NSR, rare PVC - Personally Reviewed  Physical Exam   General: chronically ill appearing WM in no acute distress. Head: Normocephalic, atraumatic, sclera non-icteric, no xanthomas, nares are without discharge. Neck: Negative for carotid bruits. JVP not elevated. Lungs: Clear bilaterally to auscultation without wheezes, rales, or rhonchi. Breathing is unlabored. Heart: RRR S1 S2 without murmurs, rubs, or gallops.  Abdomen: Soft, non-tender, non-distended with normoactive bowel sounds. No rebound/guarding. Extremities: No clubbing or cyanosis. No edema. Chronic venous stasis changes. Distal pedal pulses are 2+  and equal bilaterally. Neuro: Alert and oriented to self, hospital, 2024. Does not know full date. Psych:  Flat affect.  Labs    High Sensitivity Troponin:  No results for input(s): "TROPONINIHS" in the last 720 hours.    Cardiac EnzymesNo results for input(s): "TROPONINI" in the last 168 hours. No results for input(s): "TROPIPOC" in the last 168 hours.   Chemistry Recent Labs  Lab 02/13/23 1129 02/14/23 1300 02/15/23 0703  NA 134* 135 134*  K 3.6 3.9 3.6  CL 93* 93* 92*  CO2 28 26 26   GLUCOSE 155* 180* 128*  BUN 31* 19 23  CREATININE 5.84* 3.93* 4.61*  CALCIUM 8.3* 8.3* 8.1*  PROT 6.0* 5.8*  --   ALBUMIN 2.5* 2.5* 2.5*  AST 9* 9*  --   ALT 6 7  --   ALKPHOS 61 69  --   BILITOT 1.0 0.9  --   GFRNONAA 10* 16* 13*  ANIONGAP 13 16* 16*     Hematology Recent Labs  Lab 02/13/23 1129 02/14/23 1300 02/15/23 0703  WBC 5.7 6.3 6.0  RBC 3.92* 4.25 4.12*  HGB 9.3* 10.0* 9.8*  HCT 32.0* 33.5* 32.7*  MCV 81.6 78.8* 79.4*  MCH 23.7* 23.5* 23.8*  MCHC 29.1* 29.9* 30.0  RDW 17.1* 16.9* 16.8*  PLT 203 181 217  BNP Recent Labs  Lab 02/13/23 1129  BNP 3,500.5*     DDimer No results for input(s): "DDIMER" in the last 168 hours.   Radiology    DG Chest Port 1 View  Result Date: 02/15/2023 CLINICAL DATA:  Shortness of breath. EXAM: PORTABLE CHEST 1 VIEW COMPARISON:  02/14/2023 and chest CT 11/25/2022 FINDINGS: Persistent densities at the right lung base are most compatible with pleural fluid. Suspect consolidation or atelectasis associated with the pleural fluid. Prominent central vascular markings bilaterally. Hazy lung densities, right side greater than left. Heart size is stable with median sternotomy wires and post CABG changes. IMPRESSION: 1. Hazy parenchymal lung densities bilaterally, right side greater than left. Findings may represent asymmetric pulmonary edema. Right lung densities have slightly increased from the recent comparison examination. 2. Persistent right  pleural effusion with right basilar atelectasis or consolidation. Electronically Signed   By: Richarda Overlie M.D.   On: 02/15/2023 07:59   DG Chest Port 1V same Day  Result Date: 02/14/2023 CLINICAL DATA:  Weakness and shortness of breath. EXAM: PORTABLE CHEST 1 VIEW COMPARISON:  Radiograph yesterday. FINDINGS: Improvement in pulmonary edema from prior. Right pleural effusion has increased. Stable cardiomegaly. Prior median sternotomy. Ill-defined opacity at the left lung base, favor atelectasis. No pneumothorax. IMPRESSION: 1. Improvement in pulmonary edema. 2. Increased right pleural effusion. 3. Stable cardiomegaly. Electronically Signed   By: Narda Rutherford M.D.   On: 02/14/2023 17:34   ECHOCARDIOGRAM LIMITED  Result Date: 02/14/2023    ECHOCARDIOGRAM LIMITED REPORT   Patient Name:   AL LULLO Date of Exam: 02/14/2023 Medical Rec #:  [id number]        Height:       70.0 in Accession #:    [id number]       Weight:       179.9 lb Date of Birth:  06-May-1956        BSA:          1.995 m Patient Age:    67 years         BP:           151/70 mmHg Patient Gender: M                HR:           72 bpm. Exam Location:  Inpatient Procedure: Limited Echo, Limited Color Doppler and Cardiac Doppler Indications:    chf  History:        Patient has prior history of Echocardiogram examinations, most                 recent 11/26/2022. CHF and Cardiomyopathy, CAD; Risk                 Factors:Hypertension and Dyslipidemia.  Sonographer:    Melissa Morford RDCS (AE, PE) Referring Phys: [id number] Belmira Daley N Shambhavi Salley IMPRESSIONS  1. Left ventricular ejection fraction, by estimation, is 35 to 40%. The left ventricle has moderately decreased function. The left ventricle demonstrates global hypokinesis. There is moderate concentric left ventricular hypertrophy.  2. Right ventricular systolic function is moderately reduced. The right ventricular size is moderately enlarged.  3. Left atrial size was severely dilated.  4. Right atrial size was  severely dilated.  5. Trivial mitral valve regurgitation. No evidence of mitral stenosis. Moderate to severe mitral annular calcification.  6. The aortic valve is tricuspid. Aortic valve regurgitation is not visualized. No aortic stenosis is present. Comparison(s): Prior images reviewed side by side. The  left ventricular function is worsened. The left ventricular wall motion abnormality is unchanged. On direct review of the images, a similar inferobasal wall motion abnormality has been present since February 2023 (older images are archived). However, there does appear to be worse inferolateral contractility. Right ventricular dysfunction is also not a new finding. FINDINGS  Left Ventricle: Left ventricular ejection fraction, by estimation, is 35 to 40%. The left ventricle has moderately decreased function. The left ventricle demonstrates global hypokinesis. The left ventricular internal cavity size was normal in size. There is moderate concentric left ventricular hypertrophy.  LV Wall Scoring: The posterior wall and basal inferior segment are akinetic. The entire anterior wall, antero-lateral wall, entire septum, entire apex, and mid and distal inferior wall are hypokinetic. Right Ventricle: The right ventricular size is moderately enlarged. Right ventricular systolic function is moderately reduced. Left Atrium: Left atrial size was severely dilated. Right Atrium: Right atrial size was severely dilated. Pericardium: There is no evidence of pericardial effusion. Mitral Valve: Moderate to severe mitral annular calcification. Trivial mitral valve regurgitation. No evidence of mitral valve stenosis. Tricuspid Valve: The tricuspid valve is normal in structure. Tricuspid valve regurgitation is not demonstrated. Aortic Valve: The aortic valve is tricuspid. Aortic valve regurgitation is not visualized. No aortic stenosis is present. Pulmonic Valve: The pulmonic valve was normal in structure. Pulmonic valve regurgitation is  not visualized. Aorta: The aortic root and ascending aorta are structurally normal, with no evidence of dilitation. LEFT VENTRICLE PLAX 2D LVIDd:         6.00 cm LVIDs:         4.86 cm LV PW:         1.40 cm LV IVS:        1.40 cm  LEFT ATRIUM         Index LA diam:    4.90 cm 2.46 cm/m Rachelle Hora Croitoru MD Electronically signed by Thurmon Fair MD Signature Date/Time: 02/14/2023/5:01:35 PM    Final    CT HEAD WO CONTRAST ( )  Result Date: 02/13/2023 CLINICAL DATA:  67 year old male with history of altered mental status and somnolence. EXAM: CT HEAD WITHOUT CONTRAST TECHNIQUE: Contiguous axial images were obtained from the base of the skull through the vertex without intravenous contrast. RADIATION DOSE REDUCTION: This exam was performed according to the departmental dose-optimization program which includes automated exposure control, adjustment of the mA and/or kV according to patient size and/or use of iterative reconstruction technique. COMPARISON:  Head CT 09/09/2022. FINDINGS: Brain: Moderate cerebral and mild cerebellar atrophy, advanced for age. Patchy and confluent areas of decreased attenuation are noted throughout the deep and periventricular white matter of the cerebral hemispheres bilaterally, compatible with chronic microvascular ischemic disease. No evidence of acute infarction, hemorrhage, hydrocephalus, extra-axial collection or mass lesion/mass effect. Vascular: No hyperdense vessel or unexpected calcification. Skull: Normal. Negative for fracture or focal lesion. Sinuses/Orbits: No acute finding. Other: None. IMPRESSION: 1. No acute intracranial abnormalities. 2. Moderate cerebral and mild cerebellar atrophy with extensive chronic microvascular ischemic changes in the cerebral white matter, as above. Electronically Signed   By: Trudie Reed M.D.   On: 02/13/2023 12:41   DG Chest Portable 1 View  Result Date: 02/13/2023 CLINICAL DATA:  Fever and cough.  Shortness of breath.  Fatigue. EXAM:  PORTABLE CHEST 1 VIEW COMPARISON:  11/25/2022 FINDINGS: Stable mild cardiomegaly.  Prior CABG again noted. Low lung volumes again demonstrated. Diffuse interstitial infiltrates are likely due to interstitial edema. New small right pleural effusion also noted. IMPRESSION: Diffuse  interstitial infiltrates, likely due to interstitial edema/congestive heart failure. New small right pleural effusion. Electronically Signed   By: Danae Orleans M.D.   On: 02/13/2023 12:36    Cardiac Studies   2d echo 02/14/23   1. Left ventricular ejection fraction, by estimation, is 35 to 40%. The  left ventricle has moderately decreased function. The left ventricle  demonstrates global hypokinesis. There is moderate concentric left  ventricular hypertrophy.   2. Right ventricular systolic function is moderately reduced. The right  ventricular size is moderately enlarged.   3. Left atrial size was severely dilated.   4. Right atrial size was severely dilated.   5. Trivial mitral valve regurgitation. No evidence of mitral stenosis.  Moderate to severe mitral annular calcification.   6. The aortic valve is tricuspid. Aortic valve regurgitation is not  visualized. No aortic stenosis is present.   Patient Profile     67 y.o. 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 Had recent Covid infection 2 weeks ago. CXR with diffuse infiltrates, question CHF versus HCAP/covid pneumonitis. Cardiology consulted due to hemodynamically significant SVT on 9/3 which broke with 2nd dose of adenosine.  Assessment & Plan     1.  PSVT with long history, difficult to control in the past, borderline QTC - home regimen: amio 200mg  daily, Lopressor 25mg  BID - amiodarone increased to 200mg  BID yesterday, SVT quiescent - continue metoprolol 25mg  BID - QTc stable at on telemetry   2. Fever, acute metabolic encephalopathy with recent Covid-19 infection admitted with possible superimposed HCAP/pneumonitis versus CHF in setting of missed HD session - per medicine  3. Acute on chronic combined CHF with further reduction in LVEF - previous LVEF has varied from 40-50% range in the last several years, down declined to 35-40% with moderate RV dysfunction and severe BAE as well - given issues with prior hematemesis, anemia, falls, debility, ESRD, esophageal cancer, seems to be a poor candidate to evaluate this invasively and may be best served with medical approach, will confer with MD - volume management per HD  4. CAD s/p CABG - no acute concerns - per chart review ASA was discontinued by IM team at discharge 09/2022 during admission for hip fracture but do not see specific mention of reason/plan to resume, will defer timing of suitability for resumption to medicine team, has downtrending Hgb currently   5. Anemia - per IM team    For questions or updates, please contact [facility] Please consult [email or web address] for contact info under Cardiology/STEMI.  Signed, [name], PA-C 02/15/2023, 8:45 AM

## 2023-02-15 NOTE — Progress Notes (Signed)
 Heart Failure Navigator Progress Note  Assessed for Heart & Vascular TOC clinic readiness.  Patient does not meet criteria due to ESRD and Dialysis.   Navigator will sign off at this time.   Joelene Barriere,RN, BSN,MSN Heart Failure Nurse Navigator. Contact by secure chat only.

## 2023-02-15 NOTE — Progress Notes (Signed)
[facility] Progress Note   Subjective:   Pt is more alert today, reports he ate some breakfast. Reports high cough is better. Denies CP, palpitations, dizziness, nausea.   Objective Vitals:   02/14/23 2000 02/15/23 0000 02/15/23 0400 02/15/23 0543  BP: 135/63 134/63 (!) 141/76 (!) 148/66  Pulse: 80 70 72 72  Resp: 16 17 19 16   Temp:  98.3 F (36.8 C) 98.1 F (36.7 C) 97.7 F (36.5 C)  TempSrc:   Oral Oral  SpO2: 96% 96% 98% 96%  Weight:      Height:       Physical Exam General: Alert male in NAD Heart: RRR, no murmurs, rubs or gallops Lungs: On O2 via [place], normal WOB. CTA bilaterally  Abdomen: Soft, non-distended, +BS Extremities: No edema b/l lower extremities Dialysis Access: LUE AVF + t/b  Additional Objective Labs: Basic Metabolic Panel: Recent Labs  Lab 02/13/23 1129 02/14/23 1300 02/15/23 0703  NA 134* 135 134*  K 3.6 3.9 3.6  CL 93* 93* 92*  CO2 28 26 26   GLUCOSE 155* 180* 128*  BUN 31* 19 23  CREATININE 5.84* 3.93* 4.61*  CALCIUM 8.3* 8.3* 8.1*  PHOS  --   --  2.5   Liver Function Tests: Recent Labs  Lab 02/13/23 1129 02/14/23 1300 02/15/23 0703  AST 9* 9*  --   ALT 6 7  --   ALKPHOS 61 69  --   BILITOT 1.0 0.9  --   PROT 6.0* 5.8*  --   ALBUMIN 2.5* 2.5* 2.5*   Recent Labs  Lab 02/13/23 1129  LIPASE 31   CBC: Recent Labs  Lab 02/13/23 1129 02/14/23 1300 02/15/23 0703  WBC 5.7 6.3 6.0  NEUTROABS 4.5  --   --   HGB 9.3* 10.0* 9.8*  HCT 32.0* 33.5* 32.7*  MCV 81.6 78.8* 79.4*  PLT 203 181 217   Blood Culture    Component Value Date/Time   SDES BLOOD SITE NOT SPECIFIED 02/13/2023 1135   SPECREQUEST  02/13/2023 1135    BOTTLES DRAWN AEROBIC AND ANAEROBIC Blood Culture results may not be optimal due to an inadequate volume of blood received in culture bottles   CULT  02/13/2023 1135    NO GROWTH 2 DAYS Performed at [facility], [address] [place], [place] [address]    REPTSTATUS PENDING 02/13/2023  1135    Cardiac Enzymes: No results for input(s): "CKTOTAL", "CKMB", "CKMBINDEX", "TROPONINI" in the last 168 hours. CBG: Recent Labs  Lab 02/13/23 1757 02/14/23 0936 02/14/23 1248 02/14/23 1600  GLUCAP 140* 136* 186* 170*   Iron Studies: No results for input(s): "IRON", "TIBC", "TRANSFERRIN", "FERRITIN" in the last 72 hours. @lablastinr3 @ Studies/Results: DG Chest Port 1 View  Result Date: 02/15/2023 CLINICAL DATA:  Shortness of breath. EXAM: PORTABLE CHEST 1 VIEW COMPARISON:  02/14/2023 and chest CT 11/25/2022 FINDINGS: Persistent densities at the right lung base are most compatible with pleural fluid. Suspect consolidation or atelectasis associated with the pleural fluid. Prominent central vascular markings bilaterally. Hazy lung densities, right side greater than left. Heart size is stable with median sternotomy wires and post CABG changes. IMPRESSION: 1. Hazy parenchymal lung densities bilaterally, right side greater than left. Findings may represent asymmetric pulmonary edema. Right lung densities have slightly increased from the recent comparison examination. 2. Persistent right pleural effusion with right basilar atelectasis or consolidation. Electronically Signed   By: Richarda Overlie M.D.   On: 02/15/2023 07:59   DG Chest Port 1V  same Day  Result Date: 02/14/2023 CLINICAL DATA:  Weakness and shortness of breath. EXAM: PORTABLE CHEST 1 VIEW COMPARISON:  Radiograph yesterday. FINDINGS: Improvement in pulmonary edema from prior. Right pleural effusion has increased. Stable cardiomegaly. Prior median sternotomy. Ill-defined opacity at the left lung base, favor atelectasis. No pneumothorax. IMPRESSION: 1. Improvement in pulmonary edema. 2. Increased right pleural effusion. 3. Stable cardiomegaly. Electronically Signed   By: Narda Rutherford M.D.   On: 02/14/2023 17:34   ECHOCARDIOGRAM LIMITED  Result Date: 02/14/2023    ECHOCARDIOGRAM LIMITED REPORT   Patient Name:   THADDAEUS CAPOBIANCO Date of  Exam: 02/14/2023 Medical Rec #:  [id number]        Height:       70.0 in Accession #:    [id number]       Weight:       179.9 lb Date of Birth:  [birth year]        BSA:          1.995 m Patient Age:    67 years         BP:           151/70 mmHg Patient Gender: M                HR:           72 bpm. Exam Location:  Inpatient Procedure: Limited Echo, Limited Color Doppler and Cardiac Doppler Indications:    chf  History:        Patient has prior history of Echocardiogram examinations, most                 recent 11/26/2022. CHF and Cardiomyopathy, CAD; Risk                 Factors:Hypertension and Dyslipidemia.  Sonographer:    Melissa Morford RDCS (AE, PE) Referring Phys: [id number] DAYNA N DUNN IMPRESSIONS  1. Left ventricular ejection fraction, by estimation, is 35 to 40%. The left ventricle has moderately decreased function. The left ventricle demonstrates global hypokinesis. There is moderate concentric left ventricular hypertrophy.  2. Right ventricular systolic function is moderately reduced. The right ventricular size is moderately enlarged.  3. Left atrial size was severely dilated.  4. Right atrial size was severely dilated.  5. Trivial mitral valve regurgitation. No evidence of mitral stenosis. Moderate to severe mitral annular calcification.  6. The aortic valve is tricuspid. Aortic valve regurgitation is not visualized. No aortic stenosis is present. Comparison(s): Prior images reviewed side by side. The left ventricular function is worsened. The left ventricular wall motion abnormality is unchanged. On direct review of the images, a similar inferobasal wall motion abnormality has been present since February 2023 (older images are archived). However, there does appear to be worse inferolateral contractility. Right ventricular dysfunction is also not a new finding. FINDINGS  Left Ventricle: Left ventricular ejection fraction, by estimation, is 35 to 40%. The left ventricle has moderately decreased function. The left  ventricle demonstrates global hypokinesis. The left ventricular internal cavity size was normal in size. There is moderate concentric left ventricular hypertrophy.  LV Wall Scoring: The posterior wall and basal inferior segment are akinetic. The entire anterior wall, antero-lateral wall, entire septum, entire apex, and mid and distal inferior wall are hypokinetic. Right Ventricle: The right ventricular size is moderately enlarged. Right ventricular systolic function is moderately reduced. Left Atrium: Left atrial size was severely dilated. Right Atrium: Right atrial size was severely dilated. Pericardium: There is no evidence of  pericardial effusion. Mitral Valve: Moderate to severe mitral annular calcification. Trivial mitral valve regurgitation. No evidence of mitral valve stenosis. Tricuspid Valve: The tricuspid valve is normal in structure. Tricuspid valve regurgitation is not demonstrated. Aortic Valve: The aortic valve is tricuspid. Aortic valve regurgitation is not visualized. No aortic stenosis is present. Pulmonic Valve: The pulmonic valve was normal in structure. Pulmonic valve regurgitation is not visualized. Aorta: The aortic root and ascending aorta are structurally normal, with no evidence of dilitation. LEFT VENTRICLE PLAX 2D LVIDd:         6.00 cm LVIDs:         4.86 cm LV PW:         1.40 cm LV IVS:        1.40 cm  LEFT ATRIUM         Index LA diam:    4.90 cm 2.46 cm/m Rachelle Hora Croitoru MD Electronically signed by Thurmon Fair MD Signature Date/Time: 02/14/2023/5:01:35 PM    Final    CT HEAD WO CONTRAST ( )  Result Date: 02/13/2023 CLINICAL DATA:  68 year old male with history of altered mental status and somnolence. EXAM: CT HEAD WITHOUT CONTRAST TECHNIQUE: Contiguous axial images were obtained from the base of the skull through the vertex without intravenous contrast. RADIATION DOSE REDUCTION: This exam was performed according to the departmental dose-optimization program which includes  automated exposure control, adjustment of the mA and/or kV according to patient size and/or use of iterative reconstruction technique. COMPARISON:  Head CT 09/09/2022. FINDINGS: Brain: Moderate cerebral and mild cerebellar atrophy, advanced for age. Patchy and confluent areas of decreased attenuation are noted throughout the deep and periventricular white matter of the cerebral hemispheres bilaterally, compatible with chronic microvascular ischemic disease. No evidence of acute infarction, hemorrhage, hydrocephalus, extra-axial collection or mass lesion/mass effect. Vascular: No hyperdense vessel or unexpected calcification. Skull: Normal. Negative for fracture or focal lesion. Sinuses/Orbits: No acute finding. Other: None. IMPRESSION: 1. No acute intracranial abnormalities. 2. Moderate cerebral and mild cerebellar atrophy with extensive chronic microvascular ischemic changes in the cerebral white matter, as above. Electronically Signed   By: Trudie Reed M.D.   On: 02/13/2023 12:41   DG Chest Portable 1 View  Result Date: 02/13/2023 CLINICAL DATA:  Fever and cough.  Shortness of breath.  Fatigue. EXAM: PORTABLE CHEST 1 VIEW COMPARISON:  11/25/2022 FINDINGS: Stable mild cardiomegaly.  Prior CABG again noted. Low lung volumes again demonstrated. Diffuse interstitial infiltrates are likely due to interstitial edema. New small right pleural effusion also noted. IMPRESSION: Diffuse interstitial infiltrates, likely due to interstitial edema/congestive heart failure. New small right pleural effusion. Electronically Signed   By: Danae Orleans M.D.   On: 02/13/2023 12:36   Medications:   amiodarone  200 mg Oral BID   Chlorhexidine Gluconate Cloth  6 each Topical Q0600   fentaNYL  1 patch Transdermal Q72H   heparin  5,000 Units Subcutaneous Q8H   insulin aspart  0-6 Units Subcutaneous TID WC   levothyroxine  25 mcg Oral Daily   metoprolol tartrate  25 mg Oral BID   pantoprazole  40 mg Oral BID   sertraline   100 mg Oral Daily   sodium chloride flush  3 mL Intravenous Q12H    Outpatient Dialysis Orders: [facility] 180NRe EDW 80kg BFR 400 DFR 600 2K 2.5Ca  AVF 15g  Heparin 5000 unit bolus Mircera IV q 2 weeks- last dose 02/03/23 Venofer 100mg  IV q HD- not started yet  Assessment/Plan:  Acute encephalopathy/fever: suspected  PNA. Patient with COVID 19 about 2 weeks ago but worsening fatigue/somnolence over the past two days prior to admission. Infection work up in progress per admitting team. Much more alert today  ESRD:  On MWF schedule, went to HD Friday but missed Wednesday.  Now back on schedule, next HD today  Hypertension/volume: BP controlled but edema on CXR. He has been losing weight and likely needs his EDW lowered further. Tolerated UF 2.7L last HD but developed SVT a few hours later, lower goal to 2L for today  Anemia: Hgb 9.8, recently received ESA. IV iron was ordered outpatient but will hold for now d/t possible infection  Metabolic bone disease: Calcium controlled. Phos 2.5, holding binders  Nutrition:  Poor PO intake lately, low albumin, with recent hospitalization and COVID infection. Start protein supplements once tolerating PO Hx SVT: Hospitalized in June 2024. On amiodarone and metoprolol. Developed SVT again early this AM. Cardiology following.      Rogers Blocker, PA-C 02/15/2023, 8:41 AM  [facility] Pager: [phone]

## 2023-02-15 NOTE — Evaluation (Signed)
Occupational Therapy Evaluation Patient Details Name: [name] MRN: [id number] DOB: 05-13-1956 Today's Date: 02/15/2023   History of Present Illness Pt is 67 yo male admitted on 02/13/23 with acute metabolic encephalopathy, recent COVID, superimposed bacterial PNE vs acute CHF.  Pt with hx including ESRD on HD MWF, SVT, HTN, HLD, Barrett's esophagus/esophageal CA, and R hip ORIF 09/10/22   Clinical Impression   Pt admitted for above, at this time pt presents with impaired balance, needs Total A to help maintain static sitting balance due to poor trunk control and a strong R lean. He needs Mod A for rolling L<>R appropriately, he presents as Max to total A for LB ADLs and needs at least mod A for UB ADLs. Pt would benefit from continued acute skilled OT services to address deficits and help transition to next level of care. Patient would benefit from post acute skilled rehab facility with <3 hours of therapy and 24/7 support       If plan is discharge home, recommend the following: Two people to help with walking and/or transfers;A lot of help with bathing/dressing/bathroom;Assistance with feeding;Assistance with cooking/housework;Supervision due to cognitive status;Assist for transportation;Help with stairs or ramp for entrance    Functional Status Assessment  Patient has had a recent decline in their functional status and demonstrates the ability to make significant improvements in function in a reasonable and predictable amount of time.  Equipment Recommendations  None recommended by OT (defer)    Recommendations for Other Services       Precautions / Restrictions Precautions Precautions: Fall Restrictions Weight Bearing Restrictions: No      Mobility Bed Mobility Overal bed mobility: Needs Assistance Bed Mobility: Supine to Sit, Sit to Supine, Rolling Rolling: Mod assist   Supine to sit: Max assist Sit to supine: Total assist        Transfers                    General transfer comment: Deferred, pt soiled and not able to sustain sitting due to poor sitting balance      Balance Overall balance assessment: Needs assistance Sitting-balance support: Bilateral upper extremity supported Sitting balance-Leahy Scale: Zero Sitting balance - Comments: Total A from OT, not able to sustain position despite UE support, R lean Postural control: Right lateral lean                                 ADL either performed or assessed with clinical judgement   ADL Overall ADL's : Needs assistance/impaired Eating/Feeding: Bed level;Set up   Grooming: Bed level;Minimal assistance   Upper Body Bathing: Bed level;Moderate assistance   Lower Body Bathing: Bed level;Total assistance Lower Body Bathing Details (indicate cue type and reason): pt engaging in pericare cleaning but not able to fully reach and needs assist. Can reach his private area but no more than that Upper Body Dressing : Bed level;Moderate assistance   Lower Body Dressing: Bed level;Total assistance     Toilet Transfer Details (indicate cue type and reason): NT   Toileting - Clothing Manipulation Details (indicate cue type and reason): NT       General ADL Comments: Pt essentially bed level at this time due to poor trunk control     Vision   Additional Comments: Pt reporting fuzzy vision throguhout vision testing, although accurately stating number of therapists fingers     Perception  Praxis         Pertinent Vitals/Pain Pain Assessment Pain Assessment: Faces Faces Pain Scale: Hurts little more Pain Location: RLE Pain Descriptors / Indicators: Aching, Discomfort, Grimacing Pain Intervention(s): Repositioned, Monitored during session, Limited activity within patient's tolerance     Extremity/Trunk Assessment Upper Extremity Assessment RUE Deficits / Details: ROM WFL; MMT 2/5 LUE Deficits / Details: ROM WFL; MMT 2/5   Lower Extremity  Assessment RLE Deficits / Details: ROM WFL; MMT 2/5 LLE Deficits / Details: ROM WFL; MMT 2/5   Cervical / Trunk Assessment Cervical / Trunk Assessment: Kyphotic;Other exceptions Cervical / Trunk Exceptions: weak, poor trunk control, flexed posture but leaning posterior   Communication     Cognition Arousal: Alert Behavior During Therapy: Flat affect Overall Cognitive Status: No family/caregiver present to determine baseline cognitive functioning                                 General Comments: A&Ox4, Pt reports he felt like he was soiled but was not sure (found in feces). Follows commands with more time     General Comments  Pt notes using home 02 but upon OT entry pt had supplemental oxygen out of nose, placed back on 2L with Sp02 at 93%    Exercises Other Exercises Other Exercises: Pulls into long sitting x5 reps Other Exercises: knee tucks x5 reps BLEs   Shoulder Instructions      Home Living Family/patient expects to be discharged to:: Skilled nursing facility                                 Additional Comments: Pt has been at SNF since hip fracture/ORIF in 08/2022 with plan to return.4 falls reported from previous admission      Prior Functioning/Environment Prior Level of Function : History of Falls (last six months);Needs assist       Physical Assist : Mobility (physical);ADLs (physical)     Mobility Comments: Pt was working with therapy at [facility] and walking 20-30' with RW/assist.  Reports could transfer on his own.  Sister present and reports pt with up and downs in regards to mobility. ADLs Comments: Reports needing assist with ADLs        OT Problem List: Decreased strength;Decreased activity tolerance;Impaired balance (sitting and/or standing)      OT Treatment/Interventions: Self-care/ADL training;Balance training;Therapeutic exercise;Therapeutic activities;Patient/family education    OT Goals(Current goals can be found in the  care plan section) Acute Rehab OT Goals Patient Stated Goal: To get stronger OT Goal Formulation: With patient Time For Goal Achievement: 03/01/23 Potential to Achieve Goals: Good ADL Goals Pt Will Perform Grooming: with set-up;bed level Pt Will Perform Upper Body Bathing: with set-up;bed level Pt Will Transfer to Toilet: with mod assist;with +2 assist;stand pivot transfer;bedside commode Pt/caregiver will Perform Home Exercise Program: Increased strength;Independently (Core strength) Additional ADL Goal #1: Pt will tolerate 5 mins EOB sitting with CGA in preparation for seated ADLs  OT Frequency: Min 1X/week    Co-evaluation              AM-PAC OT "6 Clicks" Daily Activity     Outcome Measure Help from another person eating meals?: A Little Help from another person taking care of personal grooming?: A Little Help from another person toileting, which includes using toliet, bedpan, or urinal?: Total Help from another person bathing (including  washing, rinsing, drying)?: A Lot Help from another person to put on and taking off regular upper body clothing?: A Lot Help from another person to put on and taking off regular lower body clothing?: Total 6 Click Score: 12   End of Session Nurse Communication: Mobility status (likely needs purewick and bed pan setup ready)  Activity Tolerance: Patient tolerated treatment well Patient left: in bed;with call bell/phone within reach  OT Visit Diagnosis: Unsteadiness on feet (R26.81);Other abnormalities of gait and mobility (R26.89);Muscle weakness (generalized) (M62.81)                Time: 5176-1607 OT Time Calculation (min): 47 min Charges:  OT General Charges $OT Visit: 1 Visit OT Evaluation $OT Eval Moderate Complexity: 1 Mod OT Treatments $Therapeutic Activity: 23-37 mins  02/15/2023  AB, OTR/L  Acute Rehabilitation Services  Office: [phone]   Tristan Schroeder 02/15/2023, 2:14 PM

## 2023-02-15 NOTE — TOC Progression Note (Signed)
Transition of Care [facility]) - Progression Note    Patient Details  Name: [name] MRN: [id number] Date of Birth: 04-May-1956  [facility] CM/SW Contact  Mearl Latin, LCSW Phone Number: 02/15/2023, 11:44 AM  Clinical Narrative:    CSW spoke with patient's sister to confirm discharge plan back to [facility] potentially tomorrow. She expressed concern with patient not getting turned enough at SNF since they have not opened up a bed on the long term side for him yet. CSW sent concerns to Kia at [facility]. Sister requests [facility] for transport.    Expected Discharge Plan: Skilled Nursing Facility Barriers to Discharge: Continued Medical Work up  Expected Discharge Plan and Services In-house Referral: Clinical Social Work   Post Acute Care Choice: Skilled Nursing Facility Living arrangements for the past 2 months: Skilled Nursing Facility                                       Social Determinants of Health ([name]) Interventions [name] Screenings   Food Insecurity: No Food Insecurity (11/25/2022)  Housing: Low Risk  (11/25/2022)  Transportation Needs: No Transportation Needs (11/25/2022)  Recent Concern: Transportation Needs - Unmet Transportation Needs (09/09/2022)  Utilities: Not At Risk (11/25/2022)  Recent Concern: Utilities - At Risk (09/09/2022)  Alcohol Screen: Low Risk  (11/16/2022)  Depression (PHQ2-9): Low Risk  (11/16/2022)  Recent Concern: Depression (PHQ2-9) - Medium Risk (09/01/2022)  Financial Resource Strain: Low Risk  (11/16/2022)  Physical Activity: Sufficiently Active (11/16/2022)  Social Connections: Unknown (11/16/2022)  Stress: No Stress Concern Present (11/16/2022)  Tobacco Use: Medium Risk (02/08/2023)  Health Literacy: Low Risk  (03/25/2021)   Received from [facility], [facility]    Readmission Risk Interventions    08/19/2022    8:11 AM 05/04/2022    9:06 AM 03/01/2022    8:14 AM  Readmission Risk Prevention Plan  Transportation Screening Complete  Complete Complete  Medication Review (RN Care Manager) Complete Complete Complete  HRI or Home Care Consult Complete Complete Complete  SW Recovery Care/Counseling Consult Complete Complete Complete  Palliative Care Screening Not Applicable Not Applicable Not Applicable  Skilled Nursing Facility Not Applicable Not Applicable Not Applicable

## 2023-02-15 NOTE — Plan of Care (Signed)
  Problem: Education: Goal: Ability to demonstrate management of disease process will improve Outcome: Progressing   Problem: Activity: Goal: Capacity to carry out activities will improve Outcome: Progressing   Problem: Cardiac: Goal: Ability to achieve and maintain adequate cardiopulmonary perfusion will improve Outcome: Progressing   Problem: Education: Goal: Knowledge of General Education information will improve Description: Including pain rating scale, medication(s)/side effects and non-pharmacologic comfort measures Outcome: Progressing   Problem: Clinical Measurements: Goal: Ability to maintain clinical measurements within normal limits will improve Outcome: Progressing Goal: Will remain free from infection Outcome: Progressing Goal: Diagnostic test results will improve Outcome: Progressing   Problem: Activity: Goal: Risk for activity intolerance will decrease Outcome: Progressing

## 2023-02-15 NOTE — Progress Notes (Signed)
F/u scheduled in [facility] office 10/1, appt placed on AVS. (No EP availability during timeframe needed.) Will also tentatively leave f/u with Dr. Ladona Ridgel scheduled 06/2023.

## 2023-02-15 NOTE — Plan of Care (Signed)
  Problem: Education: Goal: Ability to demonstrate management of disease process will improve Outcome: Progressing Goal: Ability to verbalize understanding of medication therapies will improve Outcome: Progressing Goal: Individualized Educational Video(s) Outcome: Progressing   Problem: Activity: Goal: Capacity to carry out activities will improve Outcome: Progressing   Problem: Cardiac: Goal: Ability to achieve and maintain adequate cardiopulmonary perfusion will improve Outcome: Progressing   Problem: Education: Goal: Knowledge of General Education information will improve Description: Including pain rating scale, medication(s)/side effects and non-pharmacologic comfort measures Outcome: Progressing   Problem: Health Behavior/Discharge Planning: Goal: Ability to manage health-related needs will improve Outcome: Progressing

## 2023-02-15 NOTE — Progress Notes (Signed)
Pharmacist Heart Failure Core Measure Documentation  Assessment: [name] has an EF documented as <40% on 02/14/23 by ECHO.  Rationale: Heart failure patients with left ventricular systolic dysfunction (LVSD) and an EF < 40% should be prescribed an angiotensin converting enzyme inhibitor (ACEI) or angiotensin receptor blocker (ARB) at discharge unless a contraindication is documented in the medical record.  This patient is not currently on an ACEI or ARB for HF.  This note is being placed in the record in order to provide documentation that a contraindication to the use of these agents is present for this encounter.  ACE Inhibitor or Angiotensin Receptor Blocker is contraindicated (specify all that apply)  [x]   ACEI allergy AND ARB allergy []   Angioedema []   Moderate or severe aortic stenosis []   Hyperkalemia []   Hypotension []   Renal artery stenosis [x]   Worsening renal function, preexisting renal disease or dysfunction

## 2023-02-16 DIAGNOSIS — I471 Supraventricular tachycardia, unspecified: Secondary | ICD-10-CM | POA: Diagnosis not present

## 2023-02-16 DIAGNOSIS — G9341 Metabolic encephalopathy: Secondary | ICD-10-CM | POA: Diagnosis not present

## 2023-02-16 DIAGNOSIS — E1129 Type 2 diabetes mellitus with other diabetic kidney complication: Secondary | ICD-10-CM | POA: Diagnosis not present

## 2023-02-16 DIAGNOSIS — G9340 Encephalopathy, unspecified: Secondary | ICD-10-CM | POA: Diagnosis not present

## 2023-02-16 DIAGNOSIS — I5042 Chronic combined systolic (congestive) and diastolic (congestive) heart failure: Secondary | ICD-10-CM | POA: Diagnosis not present

## 2023-02-16 LAB — GLUCOSE, CAPILLARY
Glucose-Capillary: 179 mg/dL — ABNORMAL HIGH (ref 70–99)
Glucose-Capillary: 147 mg/dL — ABNORMAL HIGH (ref 70–99)

## 2023-02-16 MED ORDER — METOPROLOL SUCCINATE ER 50 MG PO TB24: 50.0000 mg | ORAL_TABLET | Freq: Every day | ORAL | Status: DC

## 2023-02-16 MED ORDER — HYDRALAZINE HCL 25 MG PO TABS: 25.0000 mg | ORAL_TABLET | Freq: Three times a day (TID) | ORAL | Status: DC

## 2023-02-16 MED ORDER — METOPROLOL SUCCINATE ER 50 MG PO TB24
50.0000 mg | ORAL_TABLET | Freq: Every day | ORAL | Status: DC
  Administered 2023-02-16: 50 mg via ORAL
  Filled 2023-02-16: qty 1

## 2023-02-16 MED ORDER — FENTANYL 25 MCG/HR TD PT72: 1.0000 | MEDICATED_PATCH | TRANSDERMAL | 0 refills | Status: DC

## 2023-02-16 MED ORDER — ISOSORBIDE MONONITRATE ER 30 MG PO TB24: 30.0000 mg | ORAL_TABLET | Freq: Every day | ORAL | Status: DC

## 2023-02-16 MED ORDER — AMIODARONE HCL 200 MG PO TABS: 200.0000 mg | ORAL_TABLET | Freq: Two times a day (BID) | ORAL | Status: DC

## 2023-02-16 NOTE — Progress Notes (Signed)
Patient HR sustained in the 140's,symptomatic with diaphoresis,chest pain and BP 58/33.Patient oriented to self and place.Dr Joneen Roach paged.Pt converted back to NSR with BP at 145/66 before MD arrived to the bedside.Will continue to monitor

## 2023-02-16 NOTE — Progress Notes (Signed)
 D/C order noted. Contacted [facility] to advise clinic of pt's d/c today and that pt should resume care tomorrow.   Tracy Mounce Renal Navigator [phone] 

## 2023-02-16 NOTE — Progress Notes (Signed)
[facility] Progress Note   Subjective:   Tolerated HD with 2L UF with no issues. Had another episode of symptomatic SVT overnight, he does not recall this. Currently denies SOB, CP, dizziness, nausea, palpitations. He is not sure if he typically feels bad or has heart palpitations post HD.   Objective Vitals:   02/16/23 0425 02/16/23 0431 02/16/23 0626 02/16/23 0814  BP: (!) 145/66 (!) 137/59 (!) 148/69 136/63  Pulse: 84   82  Resp: (!) 25 18  19   Temp:    (!) 97.4 F (36.3 C)  TempSrc:    Oral  SpO2: 100%   (!) 89%  Weight:      Height:       Physical Exam  General: Alert male in NAD Heart: RRR, no murmurs, rubs or gallops Lungs: On RA normal WOB. CTA bilaterally  Abdomen: Soft, non-distended, +BS Extremities: No edema b/l lower extremities Dialysis Access: LUE AVF + t/b  Additional Objective Labs: Basic Metabolic Panel: Recent Labs  Lab 02/13/23 1129 02/14/23 1300 02/15/23 0703  NA 134* 135 134*  K 3.6 3.9 3.6  CL 93* 93* 92*  CO2 28 26 26   GLUCOSE 155* 180* 128*  BUN 31* 19 23  CREATININE 5.84* 3.93* 4.61*  CALCIUM 8.3* 8.3* 8.1*  PHOS  --   --  2.5   Liver Function Tests: Recent Labs  Lab 02/13/23 1129 02/14/23 1300 02/15/23 0703  AST 9* 9*  --   ALT 6 7  --   ALKPHOS 61 69  --   BILITOT 1.0 0.9  --   PROT 6.0* 5.8*  --   ALBUMIN 2.5* 2.5* 2.5*   Recent Labs  Lab 02/13/23 1129  LIPASE 31   CBC: Recent Labs  Lab 02/13/23 1129 02/14/23 1300 02/15/23 0703  WBC 5.7 6.3 6.0  NEUTROABS 4.5  --   --   HGB 9.3* 10.0* 9.8*  HCT 32.0* 33.5* 32.7*  MCV 81.6 78.8* 79.4*  PLT 203 181 217   Blood Culture    Component Value Date/Time   SDES BLOOD SITE NOT SPECIFIED 02/13/2023 1135   SPECREQUEST  02/13/2023 1135    BOTTLES DRAWN AEROBIC AND ANAEROBIC Blood Culture results may not be optimal due to an inadequate volume of blood received in culture bottles   CULT  02/13/2023 1135    NO GROWTH 3 DAYS Performed at [facility], [address] [place], [place] [address]    REPTSTATUS PENDING 02/13/2023 1135    Cardiac Enzymes: No results for input(s): "CKTOTAL", "CKMB", "CKMBINDEX", "TROPONINI" in the last 168 hours. CBG: Recent Labs  Lab 02/14/23 1600 02/15/23 1006 02/15/23 1240 02/15/23 2139 02/16/23 0813  GLUCAP 170* 191* 140* 139* 179*   Iron Studies: No results for input(s): "IRON", "TIBC", "TRANSFERRIN", "FERRITIN" in the last 72 hours. @lablastinr3 @ Studies/Results: DG Chest Port 1 View  Result Date: 02/15/2023 CLINICAL DATA:  Shortness of breath. EXAM: PORTABLE CHEST 1 VIEW COMPARISON:  02/14/2023 and chest CT 11/25/2022 FINDINGS: Persistent densities at the right lung base are most compatible with pleural fluid. Suspect consolidation or atelectasis associated with the pleural fluid. Prominent central vascular markings bilaterally. Hazy lung densities, right side greater than left. Heart size is stable with median sternotomy wires and post CABG changes. IMPRESSION: 1. Hazy parenchymal lung densities bilaterally, right side greater than left. Findings may represent asymmetric pulmonary edema. Right lung densities have slightly increased from the recent comparison examination. 2. Persistent right pleural effusion with right basilar atelectasis  or consolidation. Electronically Signed   By: Richarda Overlie M.D.   On: 02/15/2023 07:59   DG Chest Port 1V same Day  Result Date: 02/14/2023 CLINICAL DATA:  Weakness and shortness of breath. EXAM: PORTABLE CHEST 1 VIEW COMPARISON:  Radiograph yesterday. FINDINGS: Improvement in pulmonary edema from prior. Right pleural effusion has increased. Stable cardiomegaly. Prior median sternotomy. Ill-defined opacity at the left lung base, favor atelectasis. No pneumothorax. IMPRESSION: 1. Improvement in pulmonary edema. 2. Increased right pleural effusion. 3. Stable cardiomegaly. Electronically Signed   By: Narda Rutherford M.D.   On: 02/14/2023 17:34   ECHOCARDIOGRAM  LIMITED  Result Date: 02/14/2023    ECHOCARDIOGRAM LIMITED REPORT   Patient Name:   [name] Date of Exam: 02/14/2023 Medical Rec #:  [id number]        Height:       70.0 in Accession #:    [id number]       Weight:       179.9 lb Date of Birth:  1955/11/28        BSA:          1.995 m Patient Age:    67 years         BP:           151/70 mmHg Patient Gender: M                HR:           72 bpm. Exam Location:  Inpatient Procedure: Limited Echo, Limited Color Doppler and Cardiac Doppler Indications:    chf  History:        Patient has prior history of Echocardiogram examinations, most                 recent 11/26/2022. CHF and Cardiomyopathy, CAD; Risk                 Factors:Hypertension and Dyslipidemia.  Sonographer:    Melissa Morford RDCS (AE, PE) Referring Phys: [id number] DAYNA N DUNN IMPRESSIONS  1. Left ventricular ejection fraction, by estimation, is 35 to 40%. The left ventricle has moderately decreased function. The left ventricle demonstrates global hypokinesis. There is moderate concentric left ventricular hypertrophy.  2. Right ventricular systolic function is moderately reduced. The right ventricular size is moderately enlarged.  3. Left atrial size was severely dilated.  4. Right atrial size was severely dilated.  5. Trivial mitral valve regurgitation. No evidence of mitral stenosis. Moderate to severe mitral annular calcification.  6. The aortic valve is tricuspid. Aortic valve regurgitation is not visualized. No aortic stenosis is present. Comparison(s): Prior images reviewed side by side. The left ventricular function is worsened. The left ventricular wall motion abnormality is unchanged. On direct review of the images, a similar inferobasal wall motion abnormality has been present since February 2023 (older images are archived). However, there does appear to be worse inferolateral contractility. Right ventricular dysfunction is also not a new finding. FINDINGS  Left Ventricle: Left ventricular  ejection fraction, by estimation, is 35 to 40%. The left ventricle has moderately decreased function. The left ventricle demonstrates global hypokinesis. The left ventricular internal cavity size was normal in size. There is moderate concentric left ventricular hypertrophy.  LV Wall Scoring: The posterior wall and basal inferior segment are akinetic. The entire anterior wall, antero-lateral wall, entire septum, entire apex, and mid and distal inferior wall are hypokinetic. Right Ventricle: The right ventricular size is moderately enlarged. Right ventricular systolic function is moderately reduced.  Left Atrium: Left atrial size was severely dilated. Right Atrium: Right atrial size was severely dilated. Pericardium: There is no evidence of pericardial effusion. Mitral Valve: Moderate to severe mitral annular calcification. Trivial mitral valve regurgitation. No evidence of mitral valve stenosis. Tricuspid Valve: The tricuspid valve is normal in structure. Tricuspid valve regurgitation is not demonstrated. Aortic Valve: The aortic valve is tricuspid. Aortic valve regurgitation is not visualized. No aortic stenosis is present. Pulmonic Valve: The pulmonic valve was normal in structure. Pulmonic valve regurgitation is not visualized. Aorta: The aortic root and ascending aorta are structurally normal, with no evidence of dilitation. LEFT VENTRICLE PLAX 2D LVIDd:         6.00 cm LVIDs:         4.86 cm LV PW:         1.40 cm LV IVS:        1.40 cm  LEFT ATRIUM         Index LA diam:    4.90 cm 2.46 cm/m Mihai Croitoru MD Electronically signed by Thurmon Fair MD Signature Date/Time: 02/14/2023/5:01:35 PM    Final    Medications:   amiodarone  200 mg Oral BID   Chlorhexidine Gluconate Cloth  6 each Topical Q0600   fentaNYL  1 patch Transdermal Q72H   heparin  5,000 Units Subcutaneous Q8H   hydrALAZINE  25 mg Oral Q8H   insulin aspart  0-6 Units Subcutaneous TID WC   isosorbide mononitrate  30 mg Oral Daily    levothyroxine  25 mcg Oral Daily   metoprolol succinate  50 mg Oral Daily   pantoprazole  40 mg Oral BID   sertraline  100 mg Oral Daily   sodium chloride flush  3 mL Intravenous Q12H    Outpatient Dialysis Orders: [facility] 180NRe EDW 80kg BFR 400 DFR 600 2K 2.5Ca  AVF 15g  Heparin 5000 unit bolus Mircera IV q 2 weeks- last dose 02/03/23 Venofer 100mg  IV q HD- not started yet  Assessment/Plan:  Acute encephalopathy/fever: suspected PNA. Patient with COVID 19 about 2 weeks ago but worsening fatigue/somnolence over the past two days prior to admission. Suspected volume overload. Much more alert today  ESRD:  On MWF schedule, went to HD Friday but missed Wednesday.  Now back on schedule  Hypertension/volume: BP controlled but edema on CXR. He has been losing weight and likely needs his EDW lowered further. He has gone into SVT briefly the night after HD twice. Discussed limiting IDWG so we can avoid volume overload/high UF goals.   Anemia: Hgb 9.8, recently received ESA. IV iron was ordered outpatient but will hold for now d/t possible infection  Metabolic bone disease: Calcium controlled. Phos 2.5, holding binders  Nutrition:  Poor PO intake lately, low albumin, with recent hospitalization and COVID infection.  Hx SVT: Hospitalized in June 2024. On amiodarone and metoprolol. Cardiology following.      Rogers Blocker, PA-C 02/16/2023, 8:46 AM  [facility] Pager: [phone]

## 2023-02-16 NOTE — TOC Transition Note (Signed)
Transition of Care [facility]) - CM/SW Discharge Note   Patient Details  Name: [name] MRN: [id number] Date of Birth: 1955-11-19  [facility] CM/SW Contact:  Mearl Latin, LCSW Phone Number: 02/16/2023, 12:15 PM   Clinical Narrative:    Patient will DC to: [facility] Anticipated DC date: 02/16/23 Family notified: Sister, Amy Transport by: Sharin Mons   Per MD patient ready for DC to [facility]. RN to call report prior to discharge [phone]). RN, patient, patient's family, and facility notified of DC. Discharge Summary and FL2 sent to facility. DC packet on chart including signed DNR and script. Ambulance transport requested for patient.   CSW will sign off for now as social work intervention is no longer needed. Please consult [place] again if new needs arise.     Final next level of care: Skilled Nursing Facility Barriers to Discharge: Barriers Resolved   Patient Goals and CMS Choice CMS Medicare.gov Compare Post Acute Care list provided to:: Patient Represenative (must comment) Choice offered to / list presented to : Sibling  Discharge Placement     Existing PASRR number confirmed : 02/16/23          Patient chooses bed at: [facility] Patient to be transferred to facility by: [facility] Name of family member notified: Sister Patient and family notified of of transfer: 02/16/23  Discharge Plan and Services Additional resources added to the After Visit Summary for   In-house Referral: Clinical Social Work   Post Acute Care Choice: Skilled Nursing Facility                               Social Determinants of Health ([name]) Interventions [name] Screenings   Food Insecurity: No Food Insecurity (11/25/2022)  Housing: Low Risk  (11/25/2022)  Transportation Needs: No Transportation Needs (11/25/2022)  Recent Concern: Transportation Needs - Unmet Transportation Needs (09/09/2022)  Utilities: Not At Risk (11/25/2022)  Recent Concern: Utilities - At Risk  (09/09/2022)  Alcohol Screen: Low Risk  (11/16/2022)  Depression (PHQ2-9): Low Risk  (11/16/2022)  Recent Concern: Depression (PHQ2-9) - Medium Risk (09/01/2022)  Financial Resource Strain: Low Risk  (11/16/2022)  Physical Activity: Sufficiently Active (11/16/2022)  Social Connections: Unknown (11/16/2022)  Stress: No Stress Concern Present (11/16/2022)  Tobacco Use: Medium Risk (02/08/2023)  Health Literacy: Low Risk  (03/25/2021)   Received from [facility]     Readmission Risk Interventions    08/19/2022    8:11 AM 05/04/2022    9:06 AM 03/01/2022    8:14 AM  Readmission Risk Prevention Plan  Transportation Screening Complete Complete Complete  Medication Review Oceanographer) Complete Complete Complete  HRI or Home Care Consult Complete Complete Complete  SW Recovery Care/Counseling Consult Complete Complete Complete  Palliative Care Screening Not Applicable Not Applicable Not Applicable  Skilled Nursing Facility Not Applicable Not Applicable Not Applicable

## 2023-02-16 NOTE — Discharge Summary (Signed)
PATIENT DETAILS Name: [name] Age: 67 y.o. Sex: male Date of Birth: 1956-06-06 MRN: [id number]. Admitting Physician: Synetta Fail, MD IOE:VOJJKK, Jonna Coup, MD  Admit Date: 02/13/2023 Discharge date: 02/16/2023  Recommendations for Outpatient Follow-up:  Follow up with PCP in 1-2 weeks Please obtain CMP/CBC in one week Please ensure follow-up with cardiology  Admitted From:  SNF  Disposition: Skilled nursing facility   Discharge Condition: good  CODE STATUS:   Code Status: Do not attempt resuscitation (DNR) PRE-ARREST INTERVENTIONS DESIRED   Diet recommendation:  Diet Order             Diet - low sodium heart healthy           DIET DYS 3 Room service appropriate? Yes with Assist; Fluid consistency: Thin  Diet effective now                    Brief Summary: Patient is a 68 y.o.  male with history of ESRD, SVT, HTN, HLD, Barrett's esophagus/esophageal cancer-who is he had COVID-19 infection approximately 2 weeks prior to this hospitalization-presented from his SNF with fatigue/lethargy/fever of 102F-chest x-ray showed diffuse interstitial infiltrates-concerning for CHF-he was subsequently admitted to the hospitalist service.    Significant events: 9/2>> from SNF-fatigue/fever/diffuse infiltrates on CXR-concern for CHF-nephrology consulted-underwent HD-admit to [facility]. 9/3>> SVT with hypotension-required 12 mg of adenosine to restore sinus rhythm.   Significant studies: 9/2>> CXR: Diffuse interstitial infiltrates 9/2>> CT head: No acute abnormalities 9/3>> echo: EF 35-40% 9/3>> CXR: Improved pulmonary edema   Significant microbiology data: 9/2>> blood culture: No growth 9/2>> influenza/RSV PCR: Negative 9/2>> COVID PCR:+ve   Procedures: None    Consults: GI Cardiology  Brief Hospital Course: Acute metabolic encephalopathy Recent COVID-19 infection Metabolic encephalopathy likely due to missed HD/and recent COVID-19 infection Clinically  improved-completely awake and alert this morning.   Acute pulmonary edema in the setting of acute on chronic HFrEF and missed hemodialysis Clinical and radiological improvement (repeat x-ray on 9/3 with parenchymal clearing) after HD-on room air Not felt to have PNA-ruled out Under went HD x 2-on room air-volume status stable Echo with drop in EF to 35-40%-cardiology recommending medical management.  Per cardiology-not a candidate for ARB/ARNI-on beta-blocker/hydralazine/Imdur. Please ensure follow-up with cardiology in the outpatient setting.   SVT Brief run of SVT on admission-and then last night Maintaining sinus rhythm Cardiology followed closely-discussed with Dr. O'Neill-continue amiodarone at twice daily dosing, have increased doses of beta-blocker today Ensure outpatient follow-up with cardiology.  ESRD on HD MWF Nephrology following   Normocytic anemia Secondary to ESRD Iron/Aranesp deferred to nephrology service   History of CAD s/p CABG 2020 History of CVA Stable Aspirin/beta-blocker.  Resume Repatha as an outpatient   HTN BP stable Metoprolol   HLD Repatha as outpatient   History of Barrett's esophagus/esophageal adenocarcinoma-follows with [facility] oncology GERD/dysphagia PPI  Appreciate SLP eval-dysphagia 3 diet.   Depression Zoloft   OSA Per chart/H&P-no longer uses CPAP   BMI: Estimated body mass index is 25.81 kg/m as calculated from the following:   Height as of this encounter: 5\' 10"  (1.778 m).   Weight as of this encounter: 81.6 kg.    Discharge Diagnoses:  Principal Problem:   Acute encephalopathy Active Problems:   SVT (supraventricular tachycardia)   Chronic combined systolic and diastolic CHF (congestive heart failure) (HCC)   HTN (hypertension)   Type 2 diabetes mellitus (HCC)   Dysphagia   Coronary artery disease involving coronary bypass graft of native  heart with other forms of angina pectoris (HCC)   Obstructive sleep apnea   S/P  CABG (coronary artery bypass graft)   Peripheral arterial disease (HCC)   GERD (gastroesophageal reflux disease)   Hyperlipidemia associated with type 2 diabetes mellitus (HCC)   Anemia due to chronic kidney disease   ESRD (end stage renal disease) on dialysis (HCC)   Depression   Esophageal adenocarcinoma (HCC)   Acute systolic heart failure [facility])   Discharge Instructions:  Activity:  As tolerated with Full fall precautions use walker/cane & assistance as needed   Discharge Instructions     Call MD for:  difficulty breathing, headache or visual disturbances   Complete by: As directed    Diet - low sodium heart healthy   Complete by: As directed    Discharge instructions   Complete by: As directed    Follow with Primary MD  Babs Sciara, MD in 1-2 weeks  Follow with cardiology  Follow-up with your hemodialysis clinic as previously scheduled.  Please get a complete blood count and chemistry panel checked by your Primary MD at your next visit, and again as instructed by your Primary MD.  Get Medicines reviewed and adjusted: Please take all your medications with you for your next visit with your Primary MD  Laboratory/radiological data: Please request your Primary MD to go over all hospital tests and procedure/radiological results at the follow up, please ask your Primary MD to get all Hospital records sent to his/her office.  In some cases, they will be blood work, cultures and biopsy results pending at the time of your discharge. Please request that your primary care M.D. follows up on these results.  Also Note the following: If you experience worsening of your admission symptoms, develop shortness of breath, life threatening emergency, suicidal or homicidal thoughts you must seek medical attention immediately by calling 911 or calling your MD immediately  if symptoms less severe.  You must read complete instructions/literature along with all the possible adverse  reactions/side effects for all the Medicines you take and that have been prescribed to you. Take any new Medicines after you have completely understood and accpet all the possible adverse reactions/side effects.   Do not drive when taking Pain medications or sleeping medications (Benzodaizepines)  Do not take more than prescribed Pain, Sleep and Anxiety Medications. It is not advisable to combine anxiety,sleep and pain medications without talking with your primary care practitioner  Special Instructions: If you have smoked or chewed Tobacco  in the last 2 yrs please stop smoking, stop any regular Alcohol  and or any Recreational drug use.  Wear Seat belts while driving.  Please note: You were cared for by a hospitalist during your hospital stay. Once you are discharged, your primary care physician will handle any further medical issues. Please note that NO REFILLS for any discharge medications will be authorized once you are discharged, as it is imperative that you return to your primary care physician (or establish a relationship with a primary care physician if you do not have one) for your post hospital discharge needs so that they can reassess your need for medications and monitor your lab values.   Increase activity slowly   Complete by: As directed    No wound care   Complete by: As directed       Allergies as of 02/16/2023       Reactions   Bee Venom Anaphylaxis   Atorvastatin Other (See Comments)   myalgia  Diltiazem Itching   Lopressor [metoprolol]    NIGHTMARES. Pt tolerating this now 11/25/22   Oxycodone Other (See Comments)   Reaction not listed on MAR    Reglan [metoclopramide] Other (See Comments)   Suicidal    Rosuvastatin Other (See Comments)   myalgias   Valsartan Itching        Medication List     STOP taking these medications    doxazosin 2 MG tablet Commonly known as: CARDURA   HYDROmorphone 2 MG tablet Commonly known as: DILAUDID   metoprolol  tartrate 25 MG tablet Commonly known as: LOPRESSOR       TAKE these medications    amiodarone 200 MG tablet Commonly known as: PACERONE Take 1 tablet (200 mg total) by mouth 2 (two) times daily. What changed: when to take this   calcium acetate 667 MG capsule Commonly known as: PHOSLO Take 667 mg by mouth See admin instructions. Take 667 mg by mouth twice daily on Monday, Wednesday, and Friday   Calcium Acetate 667 MG Tabs Take 667 mg by mouth See admin instructions. Take 667 mg by mouth three times daily on Tuesday, Thursday, Saturday, and Sunday   EPINEPHrine 0.3 mg/0.3 mL Soaj injection Commonly known as: EPI-PEN Inject 0.3 mg into the muscle as needed for anaphylaxis.   fentaNYL 25 MCG/HR Commonly known as: DURAGESIC Place 1 patch onto the skin every 3 (three) days.   gabapentin 100 MG capsule Commonly known as: NEURONTIN Take 100 mg by mouth at bedtime.   HumaLOG KwikPen 100 UNIT/ML KwikPen Generic drug: insulin lispro Inject 0-15 Units into the skin 3 (three) times daily. BS 0-120 inject 0 units BS 121-150 inject 2 units  BS 151-200 inject 3 units  BS 201-250 inject 5 units  BS 251-300 inject 8 units  BS 301-350 inject 11 units  BS 351-400 inject 15 units BS 401-999 inject 17 units and call provider   hydrALAZINE 25 MG tablet Commonly known as: APRESOLINE Take 1 tablet (25 mg total) by mouth every 8 (eight) hours.   isosorbide mononitrate 30 MG 24 hr tablet Commonly known as: IMDUR Take 1 tablet (30 mg total) by mouth daily.   levothyroxine 25 MCG tablet Commonly known as: SYNTHROID Take 25 mcg by mouth daily.   metoprolol succinate 50 MG 24 hr tablet Commonly known as: TOPROL-XL Take 1 tablet (50 mg total) by mouth daily. Take with or immediately following a meal.   ondansetron 4 MG/2ML Soln injection Commonly known as: ZOFRAN Inject 4 mg into the vein daily. PRN order: inject 4 mg every 8 hours as needed for nausea and vomiting   OXYGEN Inhale 2  L into the lungs continuous.   pantoprazole 40 MG tablet Commonly known as: PROTONIX Take 1 tablet (40 mg total) by mouth 2 (two) times daily.   polyethylene glycol 17 g packet Commonly known as: MiraLax Take 17 g by mouth daily.   Repatha SureClick 140 MG/ML Soaj Generic drug: Evolocumab Inject 140 mg into the skin every 14 (fourteen) days.   sertraline 100 MG tablet Commonly known as: ZOLOFT Take 1 tablet (100 mg total) by mouth daily.   UNABLE TO FIND Take 1 Dose by mouth in the morning and at bedtime. Med Name: Med plus 2.0        Follow-up Information     Alver Sorrow, NP Follow up.   Specialty: Cardiology Why: [facility] location - cardiology follow-up arranged Tuesday Mar 14, 2023 at 2:45 PM. Please arrive by 2:30 PM to check in. Contact information: [address] [place] [address] [phone]         Babs Sciara, MD. Schedule an appointment as soon as possible for a visit in 1 week(s).   Specialty: Family Medicine Contact information: [address] [place] [address] [phone]                Allergies  Allergen Reactions   Bee Venom Anaphylaxis   Atorvastatin Other (See Comments)    myalgia   Diltiazem Itching   Lopressor [Metoprolol]     NIGHTMARES. Pt tolerating this now 11/25/22   Oxycodone Other (See Comments)    Reaction not listed on MAR    Reglan [Metoclopramide] Other (See Comments)    Suicidal    Rosuvastatin Other (See Comments)    myalgias   Valsartan Itching     Other Procedures/Studies: DG Chest Port 1 View  Result Date: 02/15/2023 CLINICAL DATA:  Shortness of breath. EXAM: PORTABLE CHEST 1 VIEW COMPARISON:  02/14/2023 and chest CT 11/25/2022 FINDINGS: Persistent densities at the right lung base are most compatible with pleural fluid. Suspect consolidation or atelectasis associated with the pleural fluid. Prominent central vascular markings  bilaterally. Hazy lung densities, right side greater than left. Heart size is stable with median sternotomy wires and post CABG changes. IMPRESSION: 1. Hazy parenchymal lung densities bilaterally, right side greater than left. Findings may represent asymmetric pulmonary edema. Right lung densities have slightly increased from the recent comparison examination. 2. Persistent right pleural effusion with right basilar atelectasis or consolidation. Electronically Signed   By: Richarda Overlie M.D.   On: 02/15/2023 07:59   DG Chest Port 1V same Day  Result Date: 02/14/2023 CLINICAL DATA:  Weakness and shortness of breath. EXAM: PORTABLE CHEST 1 VIEW COMPARISON:  Radiograph yesterday. FINDINGS: Improvement in pulmonary edema from prior. Right pleural effusion has increased. Stable cardiomegaly. Prior median sternotomy. Ill-defined opacity at the left lung base, favor atelectasis. No pneumothorax. IMPRESSION: 1. Improvement in pulmonary edema. 2. Increased right pleural effusion. 3. Stable cardiomegaly. Electronically Signed   By: Narda Rutherford M.D.   On: 02/14/2023 17:34   ECHOCARDIOGRAM LIMITED  Result Date: 02/14/2023    ECHOCARDIOGRAM LIMITED REPORT   Patient Name:   ARGUSTA VOLEK Date of Exam: 02/14/2023 Medical Rec #:  [id number]        Height:       70.0 in Accession #:    [id number]       Weight:       179.9 lb Date of Birth:  [birth year]        BSA:          1.995 m Patient Age:    67 years         BP:           151/70 mmHg Patient Gender: M                HR:           72 bpm. Exam Location:  Inpatient Procedure: Limited Echo, Limited Color Doppler and Cardiac Doppler Indications:    chf  History:        Patient has prior history of Echocardiogram examinations, most                 recent 11/26/2022. CHF and Cardiomyopathy, CAD; Risk  Factors:Hypertension and Dyslipidemia.  Sonographer:    Melissa Morford RDCS (AE, PE) Referring Phys: [id number] DAYNA N DUNN IMPRESSIONS  1. Left ventricular ejection  fraction, by estimation, is 35 to 40%. The left ventricle has moderately decreased function. The left ventricle demonstrates global hypokinesis. There is moderate concentric left ventricular hypertrophy.  2. Right ventricular systolic function is moderately reduced. The right ventricular size is moderately enlarged.  3. Left atrial size was severely dilated.  4. Right atrial size was severely dilated.  5. Trivial mitral valve regurgitation. No evidence of mitral stenosis. Moderate to severe mitral annular calcification.  6. The aortic valve is tricuspid. Aortic valve regurgitation is not visualized. No aortic stenosis is present. Comparison(s): Prior images reviewed side by side. The left ventricular function is worsened. The left ventricular wall motion abnormality is unchanged. On direct review of the images, a similar inferobasal wall motion abnormality has been present since February 2023 (older images are archived). However, there does appear to be worse inferolateral contractility. Right ventricular dysfunction is also not a new finding. FINDINGS  Left Ventricle: Left ventricular ejection fraction, by estimation, is 35 to 40%. The left ventricle has moderately decreased function. The left ventricle demonstrates global hypokinesis. The left ventricular internal cavity size was normal in size. There is moderate concentric left ventricular hypertrophy.  LV Wall Scoring: The posterior wall and basal inferior segment are akinetic. The entire anterior wall, antero-lateral wall, entire septum, entire apex, and mid and distal inferior wall are hypokinetic. Right Ventricle: The right ventricular size is moderately enlarged. Right ventricular systolic function is moderately reduced. Left Atrium: Left atrial size was severely dilated. Right Atrium: Right atrial size was severely dilated. Pericardium: There is no evidence of pericardial effusion. Mitral Valve: Moderate to severe mitral annular calcification. Trivial  mitral valve regurgitation. No evidence of mitral valve stenosis. Tricuspid Valve: The tricuspid valve is normal in structure. Tricuspid valve regurgitation is not demonstrated. Aortic Valve: The aortic valve is tricuspid. Aortic valve regurgitation is not visualized. No aortic stenosis is present. Pulmonic Valve: The pulmonic valve was normal in structure. Pulmonic valve regurgitation is not visualized. Aorta: The aortic root and ascending aorta are structurally normal, with no evidence of dilitation. LEFT VENTRICLE PLAX 2D LVIDd:         6.00 cm LVIDs:         4.86 cm LV PW:         1.40 cm LV IVS:        1.40 cm  LEFT ATRIUM         Index LA diam:    4.90 cm 2.46 cm/m Rachelle Hora Croitoru MD Electronically signed by Thurmon Fair MD Signature Date/Time: 02/14/2023/5:01:35 PM    Final    CT HEAD WO CONTRAST ( )  Result Date: 02/13/2023 CLINICAL DATA:  67 year old male with history of altered mental status and somnolence. EXAM: CT HEAD WITHOUT CONTRAST TECHNIQUE: Contiguous axial images were obtained from the base of the skull through the vertex without intravenous contrast. RADIATION DOSE REDUCTION: This exam was performed according to the departmental dose-optimization program which includes automated exposure control, adjustment of the mA and/or kV according to patient size and/or use of iterative reconstruction technique. COMPARISON:  Head CT 09/09/2022. FINDINGS: Brain: Moderate cerebral and mild cerebellar atrophy, advanced for age. Patchy and confluent areas of decreased attenuation are noted throughout the deep and periventricular white matter of the cerebral hemispheres bilaterally, compatible with chronic microvascular ischemic disease. No evidence of acute infarction, hemorrhage, hydrocephalus, extra-axial collection or  mass lesion/mass effect. Vascular: No hyperdense vessel or unexpected calcification. Skull: Normal. Negative for fracture or focal lesion. Sinuses/Orbits: No acute finding. Other: None.  IMPRESSION: 1. No acute intracranial abnormalities. 2. Moderate cerebral and mild cerebellar atrophy with extensive chronic microvascular ischemic changes in the cerebral white matter, as above. Electronically Signed   By: Trudie Reed M.D.   On: 02/13/2023 12:41   DG Chest Portable 1 View  Result Date: 02/13/2023 CLINICAL DATA:  Fever and cough.  Shortness of breath.  Fatigue. EXAM: PORTABLE CHEST 1 VIEW COMPARISON:  11/25/2022 FINDINGS: Stable mild cardiomegaly.  Prior CABG again noted. Low lung volumes again demonstrated. Diffuse interstitial infiltrates are likely due to interstitial edema. New small right pleural effusion also noted. IMPRESSION: Diffuse interstitial infiltrates, likely due to interstitial edema/congestive heart failure. New small right pleural effusion. Electronically Signed   By: Danae Orleans M.D.   On: 02/13/2023 12:36     TODAY-DAY OF DISCHARGE:  Subjective:   Arna Snipe today has no headache,no chest abdominal pain,no new weakness tingling or numbness, feels much better wants to go home today.   Objective:   Blood pressure 136/63, pulse 82, temperature (!) 97.4 F (36.3 C), temperature source Oral, resp. rate 19, height 5\' 10"  (1.778 m), weight 73.4 kg, SpO2 (!) 89%.  Intake/Output Summary (Last 24 hours) at 02/16/2023 0934 Last data filed at 02/15/2023 1836 Gross per 24 hour  Intake --  Output 2000 ml  Net -2000 ml   Filed Weights   02/13/23 1127 02/15/23 1847  Weight: 81.6 kg 73.4 kg    Exam: Awake Alert, Oriented *3, No new F.N deficits, Normal affect .AT,PERRAL Supple Neck,No JVD, No cervical lymphadenopathy appriciated.  Symmetrical Chest wall movement, Good air movement bilaterally, CTAB RRR,No Gallops,Rubs or new Murmurs, No Parasternal Heave +ve B.Sounds, Abd Soft, Non tender, No organomegaly appriciated, No rebound -guarding or rigidity. No Cyanosis, Clubbing or edema, No new Rash or bruise   PERTINENT RADIOLOGIC STUDIES: DG Chest Port  1 View  Result Date: 02/15/2023 CLINICAL DATA:  Shortness of breath. EXAM: PORTABLE CHEST 1 VIEW COMPARISON:  02/14/2023 and chest CT 11/25/2022 FINDINGS: Persistent densities at the right lung base are most compatible with pleural fluid. Suspect consolidation or atelectasis associated with the pleural fluid. Prominent central vascular markings bilaterally. Hazy lung densities, right side greater than left. Heart size is stable with median sternotomy wires and post CABG changes. IMPRESSION: 1. Hazy parenchymal lung densities bilaterally, right side greater than left. Findings may represent asymmetric pulmonary edema. Right lung densities have slightly increased from the recent comparison examination. 2. Persistent right pleural effusion with right basilar atelectasis or consolidation. Electronically Signed   By: Richarda Overlie M.D.   On: 02/15/2023 07:59   DG Chest Port 1V same Day  Result Date: 02/14/2023 CLINICAL DATA:  Weakness and shortness of breath. EXAM: PORTABLE CHEST 1 VIEW COMPARISON:  Radiograph yesterday. FINDINGS: Improvement in pulmonary edema from prior. Right pleural effusion has increased. Stable cardiomegaly. Prior median sternotomy. Ill-defined opacity at the left lung base, favor atelectasis. No pneumothorax. IMPRESSION: 1. Improvement in pulmonary edema. 2. Increased right pleural effusion. 3. Stable cardiomegaly. Electronically Signed   By: Narda Rutherford M.D.   On: 02/14/2023 17:34   ECHOCARDIOGRAM LIMITED  Result Date: 02/14/2023    ECHOCARDIOGRAM LIMITED REPORT   Patient Name:   [name] Date of Exam: 02/14/2023 Medical Rec #:  [id number]        Height:       70.0 in  Accession #:    [id number]       Weight:       179.9 lb Date of Birth:  1956-04-27        BSA:          1.995 m Patient Age:    67 years         BP:           151/70 mmHg Patient Gender: M                HR:           72 bpm. Exam Location:  Inpatient Procedure: Limited Echo, Limited Color Doppler and Cardiac Doppler  Indications:    chf  History:        Patient has prior history of Echocardiogram examinations, most                 recent 11/26/2022. CHF and Cardiomyopathy, CAD; Risk                 Factors:Hypertension and Dyslipidemia.  Sonographer:    Melissa Morford RDCS (AE, PE) Referring Phys: [id number] DAYNA N DUNN IMPRESSIONS  1. Left ventricular ejection fraction, by estimation, is 35 to 40%. The left ventricle has moderately decreased function. The left ventricle demonstrates global hypokinesis. There is moderate concentric left ventricular hypertrophy.  2. Right ventricular systolic function is moderately reduced. The right ventricular size is moderately enlarged.  3. Left atrial size was severely dilated.  4. Right atrial size was severely dilated.  5. Trivial mitral valve regurgitation. No evidence of mitral stenosis. Moderate to severe mitral annular calcification.  6. The aortic valve is tricuspid. Aortic valve regurgitation is not visualized. No aortic stenosis is present. Comparison(s): Prior images reviewed side by side. The left ventricular function is worsened. The left ventricular wall motion abnormality is unchanged. On direct review of the images, a similar inferobasal wall motion abnormality has been present since February 2023 (older images are archived). However, there does appear to be worse inferolateral contractility. Right ventricular dysfunction is also not a new finding. FINDINGS  Left Ventricle: Left ventricular ejection fraction, by estimation, is 35 to 40%. The left ventricle has moderately decreased function. The left ventricle demonstrates global hypokinesis. The left ventricular internal cavity size was normal in size. There is moderate concentric left ventricular hypertrophy.  LV Wall Scoring: The posterior wall and basal inferior segment are akinetic. The entire anterior wall, antero-lateral wall, entire septum, entire apex, and mid and distal inferior wall are hypokinetic. Right Ventricle: The  right ventricular size is moderately enlarged. Right ventricular systolic function is moderately reduced. Left Atrium: Left atrial size was severely dilated. Right Atrium: Right atrial size was severely dilated. Pericardium: There is no evidence of pericardial effusion. Mitral Valve: Moderate to severe mitral annular calcification. Trivial mitral valve regurgitation. No evidence of mitral valve stenosis. Tricuspid Valve: The tricuspid valve is normal in structure. Tricuspid valve regurgitation is not demonstrated. Aortic Valve: The aortic valve is tricuspid. Aortic valve regurgitation is not visualized. No aortic stenosis is present. Pulmonic Valve: The pulmonic valve was normal in structure. Pulmonic valve regurgitation is not visualized. Aorta: The aortic root and ascending aorta are structurally normal, with no evidence of dilitation. LEFT VENTRICLE PLAX 2D LVIDd:         6.00 cm LVIDs:         4.86 cm LV PW:         1.40 cm LV IVS:  1.40 cm  LEFT ATRIUM         Index LA diam:    4.90 cm 2.46 cm/m Rachelle Hora Croitoru MD Electronically signed by Thurmon Fair MD Signature Date/Time: 02/14/2023/5:01:35 PM    Final      PERTINENT LAB RESULTS: CBC: Recent Labs    02/14/23 1300 02/15/23 0703  WBC 6.3 6.0  HGB 10.0* 9.8*  HCT 33.5* 32.7*  PLT 181 217   CMET CMP     Component Value Date/Time   NA 134 (L) 02/15/2023 0703   NA 138 10/07/2021 1202   K 3.6 02/15/2023 0703   CL 92 (L) 02/15/2023 0703   CO2 26 02/15/2023 0703   GLUCOSE 128 (H) 02/15/2023 0703   BUN 23 02/15/2023 0703   BUN 42 (H) 10/07/2021 1202   CREATININE 4.61 (H) 02/15/2023 0703   CREATININE 1.94 (H) 05/13/2019 1029   CALCIUM 8.1 (L) 02/15/2023 0703   PROT 5.8 (L) 02/14/2023 1300   PROT 5.8 (L) 10/07/2021 1202   ALBUMIN 2.5 (L) 02/15/2023 0703   ALBUMIN 3.7 (L) 10/07/2021 1202   AST 9 (L) 02/14/2023 1300   ALT 7 02/14/2023 1300   ALKPHOS 69 02/14/2023 1300   BILITOT 0.9 02/14/2023 1300   BILITOT 0.2 10/07/2021 1202    EGFR 22 (L) 10/07/2021 1202   GFRNONAA 13 (L) 02/15/2023 0703    GFR Estimated Creatinine Clearance: 16.3 mL/min (A) (by C-G formula based on SCr of 4.61 mg/dL (H)). Recent Labs    02/13/23 1129  LIPASE 31   No results for input(s): "CKTOTAL", "CKMB", "CKMBINDEX", "TROPONINI" in the last 72 hours. Invalid input(s): "POCBNP" No results for input(s): "DDIMER" in the last 72 hours. No results for input(s): "HGBA1C" in the last 72 hours. No results for input(s): "CHOL", "HDL", "LDLCALC", "TRIG", "CHOLHDL", "LDLDIRECT" in the last 72 hours. Recent Labs    02/13/23 1129  TSH 4.229   No results for input(s): "VITAMINB12", "FOLATE", "FERRITIN", "TIBC", "IRON", "RETICCTPCT" in the last 72 hours. Coags: No results for input(s): "INR" in the last 72 hours.  Invalid input(s): "PT" Microbiology: Recent Results (from the past 240 hour(s))  Blood culture (routine x 2)     Status: None (Preliminary result)   Collection Time: 02/13/23 11:30 AM   Specimen: BLOOD  Result Value Ref Range Status   Specimen Description BLOOD SITE NOT SPECIFIED  Final   Special Requests   Final    BOTTLES DRAWN AEROBIC AND ANAEROBIC Blood Culture results may not be optimal due to an inadequate volume of blood received in culture bottles   Culture   Final    NO GROWTH 3 DAYS Performed at [facility], [address] [place], [place] [address]    Report Status PENDING  Incomplete  Blood culture (routine x 2)     Status: None (Preliminary result)   Collection Time: 02/13/23 11:35 AM   Specimen: BLOOD  Result Value Ref Range Status   Specimen Description BLOOD SITE NOT SPECIFIED  Final   Special Requests   Final    BOTTLES DRAWN AEROBIC AND ANAEROBIC Blood Culture results may not be optimal due to an inadequate volume of blood received in culture bottles   Culture   Final    NO GROWTH 3 DAYS Performed at [facility], [address], [place], [place] [address]    Report Status PENDING  Incomplete   Resp panel by RT-PCR (RSV, Flu A&B, Covid) Peripheral     Status: Abnormal  Collection Time: 02/13/23 12:01 PM   Specimen: Peripheral; Nasal Swab  Result Value Ref Range Status   SARS Coronavirus 2 by RT PCR POSITIVE (A) NEGATIVE Final   Influenza A by PCR NEGATIVE NEGATIVE Final   Influenza B by PCR NEGATIVE NEGATIVE Final    Comment: (NOTE) The Xpert Xpress SARS-CoV-2/FLU/RSV plus assay is intended as an aid in the diagnosis of influenza from Nasopharyngeal swab specimens and should not be used as a sole basis for treatment. Nasal washings and aspirates are unacceptable for Xpert Xpress SARS-CoV-2/FLU/RSV testing.  Fact Sheet for Patients: BloggerCourse.com  Fact Sheet for Healthcare Providers: SeriousBroker.it  This test is not yet approved or cleared by the [place] FDA and has been authorized for detection and/or diagnosis of SARS-CoV-2 by FDA under an Emergency Use Authorization (EUA). This EUA will remain in effect (meaning this test can be used) for the duration of the COVID-19 declaration under Section 564(b)(1) of the Act, 21 U.S.C. section 360bbb-3(b)(1), unless the authorization is terminated or revoked.     Resp Syncytial Virus by PCR NEGATIVE NEGATIVE Final    Comment: (NOTE) Fact Sheet for Patients: BloggerCourse.com  Fact Sheet for Healthcare Providers: SeriousBroker.it  This test is not yet approved or cleared by the [place] FDA and has been authorized for detection and/or diagnosis of SARS-CoV-2 by FDA under an Emergency Use Authorization (EUA). This EUA will remain in effect (meaning this test can be used) for the duration of the COVID-19 declaration under Section 564(b)(1) of the Act, 21 U.S.C. section 360bbb-3(b)(1), unless the authorization is terminated or revoked.  Performed at [facility], [address], [place],  [place] [address]     FURTHER DISCHARGE INSTRUCTIONS:  Get Medicines reviewed and adjusted: Please take all your medications with you for your next visit with your Primary MD  Laboratory/radiological data: Please request your Primary MD to go over all hospital tests and procedure/radiological results at the follow up, please ask your Primary MD to get all Hospital records sent to his/her office.  In some cases, they will be blood work, cultures and biopsy results pending at the time of your discharge. Please request that your primary care M.D. goes through all the records of your hospital data and follows up on these results.  Also Note the following: If you experience worsening of your admission symptoms, develop shortness of breath, life threatening emergency, suicidal or homicidal thoughts you must seek medical attention immediately by calling 911 or calling your MD immediately  if symptoms less severe.  You must read complete instructions/literature along with all the possible adverse reactions/side effects for all the Medicines you take and that have been prescribed to you. Take any new Medicines after you have completely understood and accpet all the possible adverse reactions/side effects.   Do not drive when taking Pain medications or sleeping medications (Benzodaizepines)  Do not take more than prescribed Pain, Sleep and Anxiety Medications. It is not advisable to combine anxiety,sleep and pain medications without talking with your primary care practitioner  Special Instructions: If you have smoked or chewed Tobacco  in the last 2 yrs please stop smoking, stop any regular Alcohol  and or any Recreational drug use.  Wear Seat belts while driving.  Please note: You were cared for by a hospitalist during your hospital stay. Once you are discharged, your primary care physician will handle any further medical issues. Please note that NO REFILLS for any discharge medications will be authorized  once you  are discharged, as it is imperative that you return to your primary care physician (or establish a relationship with a primary care physician if you do not have one) for your post hospital discharge needs so that they can reassess your need for medications and monitor your lab values.  Total Time spent coordinating discharge including counseling, education and face to face time equals greater than 30 minutes.  SignedJeoffrey Massed 02/16/2023 9:34 AM

## 2023-02-16 NOTE — Progress Notes (Signed)
Speech Language Pathology Treatment: Dysphagia  Patient Details Name: [name] MRN: [id number] DOB: 08-26-1955 Today's Date: 02/16/2023 Time: 5366-4403 SLP Time Calculation (min) (ACUTE ONLY): 9 min  Assessment / Plan / Recommendation Clinical Impression  Pt seen for tolerance of upgraded Dys 3 texture. Pt awake, has full dentition and consumed sausage, graham cracker and pancake with timely mastication, transit and without residue. He initially stated he liked the puree however encouraged pt that he is able to chew without difficulties and that he will have more choices on the Dys 3 diet which he was agreeable to. Straw sips thin liquid tolerated without s/s aspiration. Recommend he continue Dys 3 texture, thin liquids, pills with thin and needs tray set up and stay upright after meals given history of Barrett's esophagus/esophageal cancer. No further ST is needed at this time.   HPI HPI: Patient is a 67 y.o.  male with history of ESRD, SVT, HTN, HLD, Barrett's esophagus/esophageal cancer-who is he had COVID-19 infection approximately 2 weeks prior to this hospitalization-presented from his SNF with fatigue/lethargy/fever of 102F-chest x-ray showed diffuse interstitial infiltrates-concerning for CHF. Pt has improved since dialysis session. Pt has been seen by SLP this year, reported he typically consumes a dys 2 diet with thinliquids, small sips, in accordance also with GI recommendations.      SLP Plan  All goals met;Discharge SLP treatment due to (comment)      Recommendations for follow up therapy are one component of a multi-disciplinary discharge planning process, led by the attending physician.  Recommendations may be updated based on patient status, additional functional criteria and insurance authorization.    Recommendations  Diet recommendations: Dysphagia 3 (mechanical soft);Thin liquid Liquids provided via: Cup;Straw Medication Administration: Whole meds with  liquid Supervision: Patient able to self feed Compensations: Slow rate;Small sips/bites Postural Changes and/or Swallow Maneuvers: Seated upright 90 degrees                  Oral care BID   None Dysphagia, unspecified (R13.10)     All goals met;Discharge SLP treatment due to (comment)     [name]  02/16/2023, 8:40 AM

## 2023-02-16 NOTE — Discharge Planning (Signed)
[facility] Patient Discharge Orders- [facility] CLINIC: [facility]  Patient's name: DARSHAN BRICKEY Admit/DC Dates: 02/13/2023 - 02/16/23  Discharge Diagnoses: Acute encephalopathy   Acute pulmonary edema  Aranesp: Given: no   Date and amount of last dose: n/a  Last Hgb: 9.8 PRBC's Given: no Date/# of units: n/a ESA dose for discharge: mircera 225 mcg IV q 2 weeks  IV Iron dose at discharge: resume 100mg  IV q HD  Heparin change: no  EDW Change: Yes New EDW: 77kg  Bath Change: no  Access intervention/Change: no Details:  Hectorol/Calcitriol change: no  Discharge Labs: Calcium 8.1 Phosphorus 2.5 Albumin 2.5 K+ 3.6  IV Antibiotics: no Details:  On Coumadin?: no Last INR: Next INR: Managed By:   OTHER/APPTS/LAB ORDERS:  -Decreased his binders to 1 per meal due to low phos, please recheck phos level in 1 week -Variable bed weights here, new EDW based on UF totals. Please call if patient is below this EDW.     D/C Meds to be reconciled by nurse after every discharge.  Completed By: Rogers Blocker, PA-C 02/16/2023, 10:27 AM  [facility] Pager: [phone]    Reviewed by: MD:______ RN_______

## 2023-02-16 NOTE — Progress Notes (Signed)
Patient with brief self terminating episode of SVT.  BP decreased to 58/33.  Arrhythmia self terminated prior to my arrival.  BP quickly normalized to 146/55.  Patient remains at baseline.  Patient is awake, without complaints.

## 2023-02-18 LAB — CULTURE, BLOOD (ROUTINE X 2)
Culture: NO GROWTH
Culture: NO GROWTH

## 2023-02-23 ENCOUNTER — Encounter: Payer: Self-pay | Admitting: *Deleted

## 2023-02-23 ENCOUNTER — Ambulatory Visit: Payer: Self-pay | Admitting: *Deleted

## 2023-02-23 NOTE — Patient Instructions (Signed)
Visit Information  Thank you for taking time to visit with me today. Please don't hesitate to contact me if I can be of assistance to you.   Following are the goals we discussed today:   Goals Addressed               This Visit's Progress     Receive Assistance Applying for Personal Care Services. (pt-stated)   On track     Care Coordination Interventions:  Interventions Today    Flowsheet Row Most Recent Value  Chronic Disease   Chronic disease during today's visit Diabetes, Hypertension (HTN), Congestive Heart Failure (CHF), Chronic Kidney Disease/End Stage Renal Disease (ESRD), Other  [Major Depression, Single Episde, Severe]  General Interventions   General Interventions Discussed/Reviewed General Interventions Discussed, General Interventions Reviewed, Annual Eye Exam, Labs, Annual Foot Exam, Lipid Profile, Durable Medical Equipment (DME), Vaccines, Health Screening, [employer], Doctor Visits, Communication with, Level of Care  [facility] Care Provider & Representative with [facility]]  Labs Hgb A1c every 3 months, Kidney Function  Vaccines COVID-19, Flu, Pneumonia, RSV, Shingles  [Encouraged]  Doctor Visits Discussed/Reviewed Doctor Visits Discussed, Doctor Visits Reviewed, Annual Wellness Visits, PCP, Specialist  Health Screening Colonoscopy, Prostate  Durable Medical Equipment (DME) BP Cuff, Glucomoter, Insulin Pump, Oxygen, Walker, Psychologist, forensic  PCP/Specialist Visits Compliance with follow-up visit  Communication with PCP/Specialists  Level of Care Applications, Assisted Living, Skilled Nursing Facility, Teaching laboratory technician Personal Care Services  Exercise Interventions   Exercise Discussed/Reviewed Exercise Discussed, Exercise Reviewed, Physical Activity, Weight Managment, Assistive device use and maintanence  Physical Activity Discussed/Reviewed Physical Activity Discussed, Physical Activity Reviewed, Types of exercise   [Encouraged]  Weight Management Weight maintenance  Education Interventions   Education Provided Provided Therapist, sports, Provided Education  Provided Verbal Education On Nutrition, Foot Care, Eye Care, Labs, Blood Sugar Monitoring, Mental Health/Coping with Illness, Applications, Exercise, Medication, When to see the doctor, [employer], Secondary school teacher  Mental Health Interventions   Mental Health Discussed/Reviewed Mental Health Discussed, Mental Health Reviewed, Coping Strategies, Crisis, Anxiety, Depression, Suicide  Nutrition Interventions   Nutrition Discussed/Reviewed Nutrition Discussed, Nutrition Reviewed, Adding fruits and vegetables, Fluid intake, Portion sizes, Decreasing sugar intake, Decreasing salt, Decreasing fats, Increaing proteins  Pharmacy Interventions   Pharmacy Dicussed/Reviewed Pharmacy Topics Discussed, Pharmacy Topics Reviewed, Medication Adherence, Affording Medications  Safety Interventions   Safety Discussed/Reviewed Safety Discussed, Safety Reviewed, Fall Risk, Home Safety  Home Engineer, water, Refer for community resources, Contact home health agency  Advanced Directive Interventions   Advanced Directives Discussed/Reviewed Advanced Directives Discussed, Advanced Directives Reviewed      Active listening & reflection utilized.  Verbalization of feelings encouraged. Emotional support provided. Problem solving interventions resolved. Task-centered strategies implemented Solution-focused activities indicated. Cognitive Behavioral Therapy initiated. Acceptance & Commitment Therapy conducted. CSW collaboration with significant other, [name] to confirm patient's continued residency at [facility] (# [phone]).   CSW collaboration with significant other, [name] to confirm continued interest in applying for [employer], through [employer] [phone]). CSW collaboration with significant other, [name] to encourage contact with CSW (# [phone]), if she has questions, needs assistance, or if additional social work needs are identified between now & our next follow-up outreach call, scheduled on 03/14/2023 at 11:30 am. CSW collaboration with significant other, [name] to encourage attendance at follow-up appointment for patient with Gillian Shields, Nurse Practitioner with [facility] at  [address] [phone]), scheduled on 03/14/2023 at 2:45 pm. CSW collaboration with significant other, [name] to encourage attendance at follow-up appointment for patient with Dr. Alvia Grove, Gastroenterologist with [facility] [phone]), scheduled on 03/23/2023 at 9:20 am. CSW collaboration with significant other, [name] to encourage attendance at follow-up appointment for patient with Dr. Lewayne Bunting, Cardiologist with [facility] [phone]), scheduled on 07/03/2023 at 1:45 pm.      Our next appointment is by telephone on 03/14/2023 at 11:30 am.  Please call the care guide team at [phone] if you need to cancel or reschedule your appointment.   If you are experiencing a Mental Health or Behavioral Health Crisis or need someone to talk to, please call the Suicide and Crisis Lifeline: 988 call the [place] National Suicide Prevention Lifeline: 208 236 1084 or TTY: 276-661-9798 TTY 901-818-7749) to talk to a trained counselor call 1-800-273-TALK (toll free, 24 hour hotline) go to [facility] [address], [place] [phone]) call the [facility] Crisis Line: [phone] call 911  Patient verbalizes understanding of instructions and care plan provided today and agrees to view in MyChart. Active MyChart status and patient understanding of how to access  instructions and care plan via MyChart confirmed with patient.     Telephone follow up appointment with care management team member scheduled for:  03/14/2023 at 11:30 am.  Danford Bad, BSW, MSW, LCSW  Embedded Practice Social Work Case Manager  [facility], Population Health Direct Dial: [phone]  Fax: [phone] Email: [email or web address]: [email or web address]

## 2023-02-23 NOTE — Patient Outreach (Signed)
Care Coordination   Follow Up Visit Note   02/23/2023  Name: [name] MRN: [id number] DOB: [birth year]  [name] is a 67 y.o. year old male who sees [name], Jonna Coup, MD for primary care. I spoke with patient's significant other, [name] by phone today.  What matters to the patients [name] and wellness today?  Receive Assistance Applying for Personal Care Services.    Goals Addressed               This Visit's Progress     Receive Assistance Applying for Personal Care Services. (pt-stated)   On track     Care Coordination Interventions:  Interventions Today    Flowsheet Row Most Recent Value  Chronic [name]   Chronic [name] during today's visit Diabetes, Hypertension (HTN), Congestive Heart Failure (CHF), Chronic Kidney [name]/End Stage Renal [name] (ESRD), Other  [Major Depression, Single Episde, Severe]  General Interventions   General Interventions Discussed/Reviewed General Interventions Discussed, General Interventions Reviewed, Annual Eye Exam, Labs, Annual Foot Exam, Lipid Profile, Durable Medical Equipment (DME), Vaccines, [name] Screening, [employer], Doctor Visits, Communication with, Level of Care  [facility] Care Provider & Representative with [name]]  Labs Hgb A1c every 3 months, Kidney Function  Vaccines COVID-19, Flu, Pneumonia, RSV, Shingles  [Encouraged]  Doctor Visits Discussed/Reviewed Doctor Visits Discussed, Doctor Visits Reviewed, Annual Wellness Visits, PCP, Specialist  [name] Screening Colonoscopy, Prostate  Durable Medical Equipment (DME) BP Cuff, Glucomoter, Insulin Pump, Oxygen, Walker, Psychologist, forensic  PCP/Specialist Visits Compliance with follow-up visit  Communication with PCP/Specialists  Level of Care Applications, Assisted Living, Skilled Nursing Facility, Teaching laboratory technician Personal Care Services  Exercise Interventions   Exercise Discussed/Reviewed Exercise  Discussed, Exercise Reviewed, Physical Activity, Weight Managment, Assistive device use and maintanence  Physical Activity Discussed/Reviewed Physical Activity Discussed, Physical Activity Reviewed, Types of exercise  [Encouraged]  Weight Management Weight maintenance  Education Interventions   Education Provided Provided Therapist, sports, Provided Education  Provided Verbal Education On Nutrition, Foot Care, Eye Care, Labs, Blood Sugar Monitoring, Mental [name]/Coping with Illness, Applications, Exercise, Medication, When to see the doctor, [employer], Secondary school teacher  Mental [name] Interventions   Mental [name] Discussed/Reviewed Mental [name] Discussed, Mental [name] Reviewed, Coping Strategies, Crisis, Anxiety, Depression, Suicide  Nutrition Interventions   Nutrition Discussed/Reviewed Nutrition Discussed, Nutrition Reviewed, Adding fruits and vegetables, Fluid intake, Portion sizes, Decreasing sugar intake, Decreasing salt, Decreasing fats, Increaing proteins  Pharmacy Interventions   Pharmacy Dicussed/Reviewed Pharmacy Topics Discussed, Pharmacy Topics Reviewed, Medication Adherence, Affording Medications  Safety Interventions   Safety Discussed/Reviewed Safety Discussed, Safety Reviewed, Fall Risk, Home Safety  Home Engineer, water, Refer for community resources, Contact home [name] agency  Advanced Directive Interventions   Advanced Directives Discussed/Reviewed Advanced Directives Discussed, Advanced Directives Reviewed      Active listening & reflection utilized.  Verbalization of feelings encouraged. Emotional support provided. Problem solving interventions resolved. Task-centered strategies implemented Solution-focused activities indicated. Cognitive Behavioral Therapy initiated. Acceptance & Commitment Therapy conducted. CSW collaboration with significant other, [name] to confirm patient's continued residency at  [name] (# [phone]).   CSW collaboration with significant other, [name] to confirm continued interest in applying for [employer], through [employer] [phone]). CSW collaboration with significant other, [name] to encourage contact with CSW (# [phone]), if she has questions, needs assistance, or if additional social work needs are identified between now & our next follow-up outreach  call, scheduled on 03/14/2023 at 11:30 am. CSW collaboration with significant other, [name] to encourage attendance at follow-up appointment for patient with [name], Nurse Practitioner with [name] at [address] [phone]), scheduled on 03/14/2023 at 2:45 pm. CSW collaboration with significant other, [name] to encourage attendance at follow-up appointment for patient with [name], Gastroenterologist with [name] [phone]), scheduled on 03/23/2023 at 9:20 am. CSW collaboration with significant other, [name] to encourage attendance at follow-up appointment for patient with Dr. Lewayne [name], Cardiologist with [facility] [phone]), scheduled on 07/03/2023 at 1:45 pm.      [name] assessments and interventions completed:  Yes.  Care Coordination Interventions:  Yes, provided.   Follow up plan: Follow up call scheduled for 03/14/2023 at 11:30 am.  Encounter Outcome:  Patient Visit Completed.   Danford Bad, BSW, MSW, Printmaker Social Work Case Set designer [name]  [name], Population [name] Direct Dial: [phone]  Fax: [phone] Email: [email or web address]: [email or web address]

## 2023-03-09 ENCOUNTER — Other Ambulatory Visit: Payer: Self-pay | Admitting: Orthopedic Surgery

## 2023-03-09 ENCOUNTER — Other Ambulatory Visit (HOSPITAL_COMMUNITY): Payer: Self-pay | Admitting: Orthopedic Surgery

## 2023-03-09 DIAGNOSIS — S72142A Displaced intertrochanteric fracture of left femur, initial encounter for closed fracture: Secondary | ICD-10-CM

## 2023-03-13 ENCOUNTER — Other Ambulatory Visit: Payer: 59

## 2023-03-14 ENCOUNTER — Ambulatory Visit (HOSPITAL_BASED_OUTPATIENT_CLINIC_OR_DEPARTMENT_OTHER): Payer: 59 | Admitting: Family

## 2023-03-14 ENCOUNTER — Ambulatory Visit: Payer: Self-pay | Admitting: *Deleted

## 2023-03-14 NOTE — Patient Instructions (Signed)
Visit Information  Thank you for taking time to visit with me today. Please don't hesitate to contact me if I can be of assistance to you.   Following are the goals we discussed today:   Goals Addressed               This Visit's Progress     Receive Assistance Applying for Personal Care Services. (pt-stated)   On track     Care Coordination Interventions:  Interventions Today    Flowsheet Row Most Recent Value  Chronic Disease   Chronic disease during today's visit Diabetes, Hypertension (HTN), Congestive Heart Failure (CHF), Chronic Kidney Disease/End Stage Renal Disease (ESRD), Other  [Major Depression, Single Episde, Severe]  General Interventions   General Interventions Discussed/Reviewed General Interventions Discussed, General Interventions Reviewed, Annual Eye Exam, Labs, Annual Foot Exam, Lipid Profile, Durable Medical Equipment (DME), Vaccines, Health Screening, [employer], Doctor Visits, Communication with, Level of Care  [facility] Care Provider & Representative with [facility]]  Labs Hgb A1c every 3 months, Kidney Function  Vaccines COVID-19, Flu, Pneumonia, RSV, Shingles  [Encouraged]  Doctor Visits Discussed/Reviewed Doctor Visits Discussed, Doctor Visits Reviewed, Annual Wellness Visits, PCP, Specialist  Health Screening Colonoscopy, Prostate  Durable Medical Equipment (DME) BP Cuff, Glucomoter, Insulin Pump, Oxygen, Walker, Psychologist, forensic  PCP/Specialist Visits Compliance with follow-up visit  Communication with PCP/Specialists  Level of Care Applications, Assisted Living, Skilled Nursing Facility, Teaching laboratory technician Personal Care Services  Exercise Interventions   Exercise Discussed/Reviewed Exercise Discussed, Exercise Reviewed, Physical Activity, Weight Managment, Assistive device use and maintanence  Physical Activity Discussed/Reviewed Physical Activity Discussed, Physical Activity Reviewed, Types of exercise   [Encouraged]  Weight Management Weight maintenance  Education Interventions   Education Provided Provided Therapist, sports, Provided Education  Provided Verbal Education On Nutrition, Foot Care, Eye Care, Labs, Blood Sugar Monitoring, Mental Health/Coping with Illness, Applications, Exercise, Medication, When to see the doctor, [employer], Secondary school teacher  Mental Health Interventions   Mental Health Discussed/Reviewed Mental Health Discussed, Mental Health Reviewed, Coping Strategies, Crisis, Anxiety, Depression, Suicide  Nutrition Interventions   Nutrition Discussed/Reviewed Nutrition Discussed, Nutrition Reviewed, Adding fruits and vegetables, Fluid intake, Portion sizes, Decreasing sugar intake, Decreasing salt, Decreasing fats, Increaing proteins  Pharmacy Interventions   Pharmacy Dicussed/Reviewed Pharmacy Topics Discussed, Pharmacy Topics Reviewed, Medication Adherence, Affording Medications  Safety Interventions   Safety Discussed/Reviewed Safety Discussed, Safety Reviewed, Fall Risk, Home Safety  Home Engineer, water, Refer for community resources, Contact home health agency  Advanced Directive Interventions   Advanced Directives Discussed/Reviewed Advanced Directives Discussed, Advanced Directives Reviewed      Active listening & reflection utilized.  Verbalization of feelings encouraged. Emotional support provided. Increased confusion acknowledged. Symptoms of chronic hip pain validated. Problem solving interventions implemented. Task-centered strategies indicated Solution-focused activities initiated. Cognitive Behavioral Therapy conducted. Acceptance & Commitment Therapy performed. Encouraged increased level of activity & exercise, as tolerated. Encouraged routine engagement in activities of interest, inside & outside the facility. Encouraged daily implementation of deep breathing exercises, relaxation techniques, &  mindfulness meditation strategies. CSW collaboration with significant other, [name] to confirm patient's continued residency at [facility] (# [phone]), to receive long-term care services.   CSW collaboration with significant other, [name] to now confirm disinterest in applying for Personal Care Services, through [facility] [phone]), as patient now requires 24 hour care & supervision in a skilled nursing facility. CSW collaboration with significant other, [name]  to encourage attendance at follow-up appointment for patient with Imaging & Radiology Services at [facility] [phone]), to undergo a CT Scan, scheduled on 03/16/2023 at 4:00 pm. CSW collaboration with significant other, [name] to encourage attendance at follow-up appointment for patient with Dr. Alvia Grove, Gastroenterologist with [facility] [phone]), scheduled on 03/23/2023 at 9:20 am. CSW collaboration with significant other, [name] to encourage contact with CSW (# [phone]), if she has questions, needs assistance, or if additional social work needs are identified between now & our next follow-up outreach call, scheduled on 04/12/2023 at 2:15 pm. CSW collaboration with significant other, [name] to encourage attendance at follow-up appointment for patient with Dr. Lewayne Bunting, Cardiologist with [facility] [phone]), scheduled on 07/03/2023 at 1:45 pm.      Our next appointment is by telephone on 04/12/2023 at 2:15 pm.  Please call the care guide team at [phone] if you need to cancel or reschedule your appointment.   If you are experiencing a Mental Health or Behavioral Health Crisis or need someone to talk to, please call the Suicide and Crisis Lifeline: 988 call the [place] National Suicide Prevention Lifeline: (315)704-8653 or TTY:  610-341-0313 TTY 435-797-2127) to talk to a trained counselor call 1-800-273-TALK (toll free, 24 hour hotline) go to [facility] [address] [place] [phone]) call the [facility] Crisis Line: [phone] call 911  Patient verbalizes understanding of instructions and care plan provided today and agrees to view in MyChart. Active MyChart status and patient understanding of how to access instructions and care plan via MyChart confirmed with patient.     Telephone follow up appointment with care management team member scheduled for:  04/12/2023 at 2:15 pm.  Danford Bad, BSW, MSW, LCSW  Embedded Practice Social Work Case Manager  [facility], Population Health Direct Dial: [phone]  Fax: [phone] Email: [email or web address]

## 2023-03-14 NOTE — Patient Outreach (Signed)
Care Coordination   Follow Up Visit Note   03/14/2023  Name: [name] MRN: [id number] DOB: [birth year]  [name] is a 67 y.o. year old male who sees [name], Jonna Coup, MD for primary care. I spoke with patient's significant other, [name] by phone today.  What matters to the patients health and wellness today?  Receive Assistance Applying for Personal Care Services.    Goals Addressed               This Visit's Progress     Receive Assistance Applying for Personal Care Services. (pt-stated)   On track     Care Coordination Interventions:  Interventions Today    Flowsheet Row Most Recent Value  Chronic Disease   Chronic disease during today's visit Diabetes, Hypertension (HTN), Congestive Heart Failure (CHF), Chronic Kidney Disease/End Stage Renal Disease (ESRD), Other  [Major Depression, Single Episde, Severe]  General Interventions   General Interventions Discussed/Reviewed General Interventions Discussed, General Interventions Reviewed, Annual Eye Exam, Labs, Annual Foot Exam, Lipid Profile, Durable Medical Equipment (DME), Vaccines, Health Screening, [employer], Doctor Visits, Communication with, Level of Care  [facility] Care Provider & Representative with [facility]]  Labs Hgb A1c every 3 months, Kidney Function  Vaccines COVID-19, Flu, Pneumonia, RSV, Shingles  [Encouraged]  Doctor Visits Discussed/Reviewed Doctor Visits Discussed, Doctor Visits Reviewed, Annual Wellness Visits, PCP, Specialist  Health Screening Colonoscopy, Prostate  Durable Medical Equipment (DME) BP Cuff, Glucomoter, Insulin Pump, Oxygen, Walker, Psychologist, forensic  PCP/Specialist Visits Compliance with follow-up visit  Communication with PCP/Specialists  Level of Care Applications, Assisted Living, Skilled Nursing Facility, Teaching laboratory technician Personal Care Services  Exercise Interventions   Exercise Discussed/Reviewed Exercise  Discussed, Exercise Reviewed, Physical Activity, Weight Managment, Assistive device use and maintanence  Physical Activity Discussed/Reviewed Physical Activity Discussed, Physical Activity Reviewed, Types of exercise  [Encouraged]  Weight Management Weight maintenance  Education Interventions   Education Provided Provided Therapist, sports, Provided Education  Provided Verbal Education On Nutrition, Foot Care, Eye Care, Labs, Blood Sugar Monitoring, Mental Health/Coping with Illness, Applications, Exercise, Medication, When to see the doctor, [employer], Secondary school teacher  Mental Health Interventions   Mental Health Discussed/Reviewed Mental Health Discussed, Mental Health Reviewed, Coping Strategies, Crisis, Anxiety, Depression, Suicide  Nutrition Interventions   Nutrition Discussed/Reviewed Nutrition Discussed, Nutrition Reviewed, Adding fruits and vegetables, Fluid intake, Portion sizes, Decreasing sugar intake, Decreasing salt, Decreasing fats, Increaing proteins  Pharmacy Interventions   Pharmacy Dicussed/Reviewed Pharmacy Topics Discussed, Pharmacy Topics Reviewed, Medication Adherence, Affording Medications  Safety Interventions   Safety Discussed/Reviewed Safety Discussed, Safety Reviewed, Fall Risk, Home Safety  Home Engineer, water, Refer for community resources, Contact home health agency  Advanced Directive Interventions   Advanced Directives Discussed/Reviewed Advanced Directives Discussed, Advanced Directives Reviewed      Active listening & reflection utilized.  Verbalization of feelings encouraged. Emotional support provided. Increased confusion acknowledged. Symptoms of chronic hip pain validated. Problem solving interventions implemented. Task-centered strategies indicated Solution-focused activities initiated. Cognitive Behavioral Therapy conducted. Acceptance & Commitment Therapy performed. Encouraged increased  level of activity & exercise, as tolerated. Encouraged routine engagement in activities of interest, inside & outside the facility. Encouraged daily implementation of deep breathing exercises, relaxation techniques, & mindfulness meditation strategies. CSW collaboration with significant other, [name] to confirm patient's continued residency at [facility] (# [phone]), to receive long-term care services.   CSW collaboration with significant other, [name] to now  confirm disinterest in applying for Personal Care Services, through [facility] [phone]), as patient now requires 24 hour care & supervision in a skilled nursing facility. CSW collaboration with significant other, [name] to encourage attendance at follow-up appointment for patient with Imaging & Radiology Services at [facility] [phone]), to undergo a CT Scan, scheduled on 03/16/2023 at 4:00 pm. CSW collaboration with significant other, [name] to encourage attendance at follow-up appointment for patient with Dr. Alvia Grove, Gastroenterologist with [facility] for Gastrointestinal Biology & Disease [phone]), scheduled on 03/23/2023 at 9:20 am. CSW collaboration with significant other, [name] to encourage contact with CSW (# [phone]), if she has questions, needs assistance, or if additional social work needs are identified between now & our next follow-up outreach call, scheduled on 04/12/2023 at 2:15 pm. CSW collaboration with significant other, [name] to encourage attendance at follow-up appointment for patient with Dr. Lewayne Bunting, Cardiologist with [facility] [phone]), scheduled on 07/03/2023 at 1:45 pm.      [name] assessments and interventions completed:  Yes.  Care Coordination Interventions:  Yes, provided.   Follow up plan: Follow up call scheduled for 04/12/2023 at 2:15  pm.  Encounter Outcome:  Patient Visit Completed.   Danford Bad, BSW, MSW, Printmaker Social Work Case Set designer Health  [facility], Population Health Direct Dial: [phone]  Fax: [phone] Email: [email or web address]

## 2023-03-15 ENCOUNTER — Other Ambulatory Visit (HOSPITAL_COMMUNITY): Payer: 59

## 2023-03-16 ENCOUNTER — Ambulatory Visit (HOSPITAL_COMMUNITY)
Admission: RE | Admit: 2023-03-16 | Discharge: 2023-03-16 | Disposition: A | Discharge status: Home / Self Care | Payer: Medicare Other | Source: Ambulatory Visit | Attending: Orthopedic Surgery | Admitting: Orthopedic Surgery

## 2023-03-16 DIAGNOSIS — S72142A Displaced intertrochanteric fracture of left femur, initial encounter for closed fracture: Secondary | ICD-10-CM | POA: Insufficient documentation

## 2023-03-16 DIAGNOSIS — S72141A Displaced intertrochanteric fracture of right femur, initial encounter for closed fracture: Secondary | ICD-10-CM | POA: Insufficient documentation

## 2023-04-12 ENCOUNTER — Ambulatory Visit: Payer: Self-pay | Admitting: *Deleted

## 2023-04-12 NOTE — Patient Instructions (Signed)
Visit Information  Thank you for taking time to visit with me today. Please don't hesitate to contact me if I can be of assistance to you.   Following are the goals we discussed today:   Goals Addressed               This Visit's Progress     Receive Assistance Applying for Personal Care Services. (pt-stated)   On track     Care Coordination Interventions:  Interventions Today    Flowsheet Row Most Recent Value  Chronic Disease   Chronic disease during today's visit Diabetes, Congestive Heart Failure (CHF), Hypertension (HTN), Chronic Kidney Disease/End Stage Renal Disease (ESRD), Other  [Major Depression, Single Episde, Severe, Coronary Artery Disease, Dysphagia, Diabetic Neuropathy, Right Eye Vision Loss, Left Eye Blurred Vision, Malnutrition of Moderate Degree, Inability to Perform Activities of Daily Living Independently]  General Interventions   General Interventions Discussed/Reviewed General Interventions Discussed, Durable Medical Equipment (DME), Communication with, Level of Care, General Interventions Reviewed, Annual Eye Exam, Labs, Annual Foot Exam, Lipid Profile, Doctor Visits, Community Resources, Health Screening  [Communication with Care Team Members]  Labs Hgb A1c every 3 months, Kidney Function  Doctor Visits Discussed/Reviewed Doctor Visits Discussed, Doctor Visits Reviewed, Annual Wellness Visits, PCP, Specialist  Health Screening Bone Density, Colonoscopy, Prostate  [Encouraged]  Durable Medical Equipment (DME) Bed side commode, BP Cuff, Glucomoter, Insulin Pump, Lift Chair, Oxygen, Environmental consultant, Wheelchair, Tour manager, Other  [Prescription Glasses, Engineer, materials in [employer], Hand-Held [employer], Hospital Bed]  Wheelchair Standard  PCP/Specialist Visits Compliance with follow-up visit  Communication with PCP/Specialists, RN  Level of Care Adult Daycare, Applications, Assisted Living, Skilled Nursing Facility, Teaching laboratory technician Medicaid, Personal Care  Services, [place]-2  Exercise Interventions   Exercise Discussed/Reviewed Exercise Discussed, Exercise Reviewed, Physical Activity, Weight Managment, Assistive device use and maintanence  Physical Activity Discussed/Reviewed Physical Activity Discussed, Physical Activity Reviewed, Types of exercise, Home Exercise Program (HEP), PREP, Gym  Weight Management Weight loss  Education Interventions   Education Provided Provided Education  Provided Verbal Education On Nutrition, Exercise, Medication, When to see the doctor, Mental Health/Coping with Illness, [employer], Development worker, community, Production assistant, radio, Personal Care Services, FL-2  Mental Health Interventions   Mental Health Discussed/Reviewed Mental Health Discussed, Substance Abuse, Suicide, Mental Health Reviewed, Coping Strategies, Crisis, Anxiety, Depression, Grief and Loss  Nutrition Interventions   Nutrition Discussed/Reviewed Nutrition Discussed, Portion sizes, Decreasing sugar intake, Nutrition Reviewed, Increasing proteins, Carbohydrate meal planning, Adding fruits and vegetables, Decreasing fats, Decreasing salt, Supplemental nutrition, Fluid intake  Pharmacy Interventions   Pharmacy Dicussed/Reviewed Pharmacy Topics Discussed, Affording Medications, Pharmacy Topics Reviewed, Medications and their functions, Medication Adherence  Safety Interventions   Safety Discussed/Reviewed Safety Discussed, Safety Reviewed, Fall Risk  Home Safety Assistive Devices  Advanced Directive Interventions   Advanced Directives Discussed/Reviewed Advanced Directives Discussed      Active listening & reflection utilized.  Verbalization of feelings encouraged. Emotional support provided. Problem solving interventions employed. Solution-focused strategies implemented. Acceptance & Commitment Therapy initiated. CSW collaboration with significant other, [name] to confirm patient's continued residency at [facility] (# [phone]), to receive long-term care services.   CSW collaboration with significant other, [name] to encourage contact with CSW (# [phone]), if she has questions, needs assistance, or if additional social work needs are identified between now & our next follow-up outreach call, scheduled on 04/28/2023 at 9:15 am. CSW collaboration with significant other, [name] to encourage attendance at follow-up appointment  for patient with Dr. Lewayne Bunting, Cardiologist with [facility] [phone]), scheduled on 07/03/2023 at 1:45 pm.      Our next appointment is by telephone on 04/28/2023 at 9:15 am.  Please call the care guide team at [phone] if you need to cancel or reschedule your appointment.   If you are experiencing a Mental Health or Behavioral Health Crisis or need someone to talk to, please call the Suicide and Crisis Lifeline: 988 call the [place] National Suicide Prevention Lifeline: (743)287-6761 or TTY: (269)147-0421 TTY (682) 134-3960) to talk to a trained counselor call 1-800-273-TALK (toll free, 24 hour hotline) go to [facility] [address], [place] [phone]) call the [facility] Crisis Line: [phone] call 911  Patient verbalizes understanding of instructions and care plan provided today and agrees to view in MyChart. Active MyChart status and patient understanding of how to access instructions and care plan via MyChart confirmed with patient.     Telephone follow up appointment with care management team member scheduled for:  04/28/2023 at 9:15 am.   Danford Bad, BSW, MSW, LCSW  Embedded Practice Social Work Case Manager  [facility], Population Health Direct Dial: [phone]  Fax: [phone] Email: [email or web address] Website: [email or web address]

## 2023-04-12 NOTE — Patient Outreach (Signed)
Care Coordination   Follow Up Visit Note   04/12/2023  Name: [name] MRN: [id number] DOB: November 18, 1955  [name] is a 67 y.o. year old male who sees [name], Jonna Coup, MD for primary care. I spoke with patient's significant other, [name] by phone today.  What matters to the patients health and wellness today?  Receive Assistance Applying for Personal Care Services.    Goals Addressed               This Visit's Progress     Receive Assistance Applying for Personal Care Services. (pt-stated)   On track     Care Coordination Interventions:  Interventions Today    Flowsheet Row Most Recent Value  Chronic Disease   Chronic disease during today's visit Diabetes, Congestive Heart Failure (CHF), Hypertension (HTN), Chronic Kidney Disease/End Stage Renal Disease (ESRD), Other  [Major Depression, Single Episde, Severe, Coronary Artery Disease, Dysphagia, Diabetic Neuropathy, Right Eye Vision Loss, Left Eye Blurred Vision, Malnutrition of Moderate Degree, Inability to Perform Activities of Daily Living Independently]  General Interventions   General Interventions Discussed/Reviewed General Interventions Discussed, Durable Medical Equipment (DME), Communication with, Level of Care, General Interventions Reviewed, Annual Eye Exam, Labs, Annual Foot Exam, Lipid Profile, Doctor Visits, Community Resources, Health Screening  [Communication with Care Team Members]  Labs Hgb A1c every 3 months, Kidney Function  Doctor Visits Discussed/Reviewed Doctor Visits Discussed, Doctor Visits Reviewed, Annual Wellness Visits, PCP, Specialist  Health Screening Bone Density, Colonoscopy, Prostate  [Encouraged]  Durable Medical Equipment (DME) Bed side commode, BP Cuff, Glucomoter, Insulin Pump, Lift Chair, Oxygen, Environmental consultant, Wheelchair, Tour manager, Other  [Prescription Glasses, Engineer, materials in [employer], Hand-Held [employer], Hospital Bed]  Wheelchair Standard  PCP/Specialist Visits Compliance with  follow-up visit  Communication with PCP/Specialists, RN  Level of Care Adult Daycare, Applications, Assisted Living, Skilled Nursing Facility, Teaching laboratory technician Medicaid, Personal Care Services, [place]-2  Exercise Interventions   Exercise Discussed/Reviewed Exercise Discussed, Exercise Reviewed, Physical Activity, Weight Managment, Assistive device use and maintanence  Physical Activity Discussed/Reviewed Physical Activity Discussed, Physical Activity Reviewed, Types of exercise, Home Exercise Program (HEP), PREP, Gym  Weight Management Weight loss  Education Interventions   Education Provided Provided Education  Provided Verbal Education On Nutrition, Exercise, Medication, When to see the doctor, Mental Health/Coping with Illness, [employer], Development worker, community, Production assistant, radio, Personal Care Services, FL-2  Mental Health Interventions   Mental Health Discussed/Reviewed Mental Health Discussed, Substance Abuse, Suicide, Mental Health Reviewed, Coping Strategies, Crisis, Anxiety, Depression, Grief and Loss  Nutrition Interventions   Nutrition Discussed/Reviewed Nutrition Discussed, Portion sizes, Decreasing sugar intake, Nutrition Reviewed, Increasing proteins, Carbohydrate meal planning, Adding fruits and vegetables, Decreasing fats, Decreasing salt, Supplemental nutrition, Fluid intake  Pharmacy Interventions   Pharmacy Dicussed/Reviewed Pharmacy Topics Discussed, Affording Medications, Pharmacy Topics Reviewed, Medications and their functions, Medication Adherence  Safety Interventions   Safety Discussed/Reviewed Safety Discussed, Safety Reviewed, Fall Risk  Home Safety Assistive Devices  Advanced Directive Interventions   Advanced Directives Discussed/Reviewed Advanced Directives Discussed      Active listening & reflection utilized.  Verbalization of feelings encouraged. Emotional support provided. Problem solving interventions  employed. Solution-focused strategies implemented. Acceptance & Commitment Therapy initiated. CSW collaboration with significant other, [name] to confirm patient's continued residency at [facility] (# [phone]), to receive long-term care services.   CSW collaboration with significant other, [name] to encourage contact with CSW (# [phone]), if she has questions, needs assistance, or  if additional social work needs are identified between now & our next follow-up outreach call, scheduled on 04/28/2023 at 9:15 am. CSW collaboration with significant other, [name] to encourage attendance at follow-up appointment for patient with Dr. Lewayne Bunting, Cardiologist with [facility] [phone]), scheduled on 07/03/2023 at 1:45 pm.      [name] assessments and interventions completed:  Yes.  Care Coordination Interventions:  Yes, provided.   Follow up plan: Follow up call scheduled for 04/28/2023 at 9:15 am.  Encounter Outcome:  Patient Visit Completed.   Danford Bad, BSW, MSW, Printmaker Social Work Case Set designer Health  [facility], Population Health Direct Dial: [phone]  Fax: [phone] Email: [email or web address]: [email or web address]

## 2023-04-28 ENCOUNTER — Ambulatory Visit: Payer: Self-pay | Admitting: *Deleted

## 2023-04-28 NOTE — Patient Outreach (Signed)
Care Coordination   Follow Up Visit Note   04/28/2023  Name: [name] MRN: [id number] DOB: 06/19/1955  [name] is a 67 y.o. year old male who sees [name], Jonna Coup, MD for primary care. I spoke with patient's significant other, [name] by phone today.  What matters to the patients health and wellness today?  Receive Assistance Applying for Personal Care Services.    Goals Addressed               This Visit's Progress     Receive Assistance Applying for Personal Care Services. (pt-stated)   On track     Care Coordination Interventions:  Interventions Today    Flowsheet Row Most Recent Value  Chronic Disease   Chronic disease during today's visit Diabetes, Congestive Heart Failure (CHF), Hypertension (HTN), Chronic Kidney Disease/End Stage Renal Disease (ESRD), Other  [Major Depression, Single Episde, Severe, Coronary Artery Disease, Dysphagia, Diabetic Neuropathy, Right Eye Vision Loss, Left Eye Blurred Vision, Malnutrition of Moderate Degree, Inability to Perform Activities of Daily Living Independently]  General Interventions   General Interventions Discussed/Reviewed General Interventions Discussed, Durable Medical Equipment (DME), Communication with, Level of Care, General Interventions Reviewed, Annual Eye Exam, Labs, Annual Foot Exam, Lipid Profile, Doctor Visits, Community Resources, Health Screening  [Communication with Care Team Members]  Labs Hgb A1c every 3 months, Kidney Function  Doctor Visits Discussed/Reviewed Doctor Visits Discussed, Doctor Visits Reviewed, Annual Wellness Visits, PCP, Specialist  Health Screening Bone Density, Colonoscopy, Prostate  [Encouraged]  Durable Medical Equipment (DME) Bed side commode, BP Cuff, Glucomoter, Insulin Pump, Lift Chair, Oxygen, Environmental consultant, Wheelchair, Tour manager, Other  [Prescription Glasses, Engineer, materials in [employer], Hand-Held [employer], Hospital Bed]  Wheelchair Standard  PCP/Specialist Visits Compliance with  follow-up visit  Communication with PCP/Specialists, RN  Level of Care Adult Daycare, Applications, Assisted Living, Skilled Nursing Facility, Teaching laboratory technician Medicaid, Personal Care Services, FL-2  Exercise Interventions   Exercise Discussed/Reviewed Exercise Discussed, Exercise Reviewed, Physical Activity, Weight Managment, Assistive device use and maintanence  Physical Activity Discussed/Reviewed Physical Activity Discussed, Physical Activity Reviewed, Types of exercise, Home Exercise Program (HEP), PREP, Gym  Weight Management Weight loss  Education Interventions   Education Provided Provided Education  Provided Verbal Education On Nutrition, Exercise, Medication, When to see the doctor, Mental Health/Coping with Illness, [employer], Development worker, community, Production assistant, radio, Personal Care Services, FL-2  Mental Health Interventions   Mental Health Discussed/Reviewed Mental Health Discussed, Substance Abuse, Suicide, Mental Health Reviewed, Coping Strategies, Crisis, Anxiety, Depression, Grief and Loss  Nutrition Interventions   Nutrition Discussed/Reviewed Nutrition Discussed, Portion sizes, Decreasing sugar intake, Nutrition Reviewed, Increasing proteins, Carbohydrate meal planning, Adding fruits and vegetables, Decreasing fats, Decreasing salt, Supplemental nutrition, Fluid intake  Pharmacy Interventions   Pharmacy Dicussed/Reviewed Pharmacy Topics Discussed, Affording Medications, Pharmacy Topics Reviewed, Medications and their functions, Medication Adherence  Safety Interventions   Safety Discussed/Reviewed Safety Discussed, Safety Reviewed, Fall Risk  Home Safety Assistive Devices  Advanced Directive Interventions   Advanced Directives Discussed/Reviewed Advanced Directives Discussed      Active Listening & Reflection Utilized.  Verbalization of Feelings Encouraged. Emotional Support Provided. Problem-Solving Interventions  Indicated. Solution-Focused Strategies Employed. Acceptance & Commitment Therapy Performed. CSW Collaboration with Significant Other, [name] to Encourage Patient Only Eat a Low-Sodium, Heart Healthy Diet, as [name] Often Provides Patient with Meals from Home & [employer].  CSW Collaboration with Significant Other, [name] to Confirm Patient's Continued Attendance at [facility] [phone]), to Receive Dialysis Treatments, on Monday's, Wednesday's & Friday's. CSW Collaboration with Significant Other, [name] to Confirm Patient's Continued Residency at [facility] (# [phone]), Skilled Nursing/Extended Stay Facility, to Receive [facility].   CSW Collaboration with Significant Other, [name] to [employer] in Continuing to [employer] in The Home for Patient, through [facility] [phone]). CSW Collaboration with Significant Other, [name] to [employer] with Danford Bad, Licensed Clinical Social Worker with [facility] [phone]), if She Has Questions, Needs Assistance, or If Additional Social Work Needs Are Identified Between Now & Our Next Follow-Up Outreach Call, Scheduled on 05/26/2023 at 9:00 AM.      [name] assessments and interventions completed:  Yes.  Care Coordination Interventions:  Yes, provided.   Follow up plan: Follow up call scheduled for 05/26/2023 at 9:00 am.  Encounter Outcome:  Patient Visit Completed.   Danford Bad, BSW, MSW, Printmaker Social Work Case Set designer Health  [facility], Population Health Direct Dial: [phone]  Fax: [phone] Email: [email or web address] Website: [email or web address]

## 2023-04-28 NOTE — Patient Instructions (Signed)
Visit Information  Thank you for taking time to visit with me today. Please don't hesitate to contact me if I can be of assistance to you.   Following are the goals we discussed today:   Goals Addressed               This Visit's Progress     Receive Assistance Applying for Personal Care Services. (pt-stated)   On track     Care Coordination Interventions:  Interventions Today    Flowsheet Row Most Recent Value  Chronic Disease   Chronic disease during today's visit Diabetes, Congestive Heart Failure (CHF), Hypertension (HTN), Chronic Kidney Disease/End Stage Renal Disease (ESRD), Other  [Major Depression, Single Episde, Severe, Coronary Artery Disease, Dysphagia, Diabetic Neuropathy, Right Eye Vision Loss, Left Eye Blurred Vision, Malnutrition of Moderate Degree, Inability to Perform Activities of Daily Living Independently]  General Interventions   General Interventions Discussed/Reviewed General Interventions Discussed, Durable Medical Equipment (DME), Communication with, Level of Care, General Interventions Reviewed, Annual Eye Exam, Labs, Annual Foot Exam, Lipid Profile, Doctor Visits, Community Resources, Health Screening  [Communication with Care Team Members]  Labs Hgb A1c every 3 months, Kidney Function  Doctor Visits Discussed/Reviewed Doctor Visits Discussed, Doctor Visits Reviewed, Annual Wellness Visits, PCP, Specialist  Health Screening Bone Density, Colonoscopy, Prostate  [Encouraged]  Durable Medical Equipment (DME) Bed side commode, BP Cuff, Glucomoter, Insulin Pump, Lift Chair, Oxygen, Environmental consultant, Wheelchair, Tour manager, Other  [Prescription Glasses, Engineer, materials in [employer], Hand-Held [employer], Hospital Bed]  Wheelchair Standard  PCP/Specialist Visits Compliance with follow-up visit  Communication with PCP/Specialists, RN  Level of Care Adult Daycare, Applications, Assisted Living, Skilled Nursing Facility, Teaching laboratory technician Medicaid, Personal Care  Services, FL-2  Exercise Interventions   Exercise Discussed/Reviewed Exercise Discussed, Exercise Reviewed, Physical Activity, Weight Managment, Assistive device use and maintanence  Physical Activity Discussed/Reviewed Physical Activity Discussed, Physical Activity Reviewed, Types of exercise, Home Exercise Program (HEP), PREP, Gym  Weight Management Weight loss  Education Interventions   Education Provided Provided Education  Provided Verbal Education On Nutrition, Exercise, Medication, When to see the doctor, Mental Health/Coping with Illness, [employer], Development worker, community, Production assistant, radio, Personal Care Services, FL-2  Mental Health Interventions   Mental Health Discussed/Reviewed Mental Health Discussed, Substance Abuse, Suicide, Mental Health Reviewed, Coping Strategies, Crisis, Anxiety, Depression, Grief and Loss  Nutrition Interventions   Nutrition Discussed/Reviewed Nutrition Discussed, Portion sizes, Decreasing sugar intake, Nutrition Reviewed, Increasing proteins, Carbohydrate meal planning, Adding fruits and vegetables, Decreasing fats, Decreasing salt, Supplemental nutrition, Fluid intake  Pharmacy Interventions   Pharmacy Dicussed/Reviewed Pharmacy Topics Discussed, Affording Medications, Pharmacy Topics Reviewed, Medications and their functions, Medication Adherence  Safety Interventions   Safety Discussed/Reviewed Safety Discussed, Safety Reviewed, Fall Risk  Home Safety Assistive Devices  Advanced Directive Interventions   Advanced Directives Discussed/Reviewed Advanced Directives Discussed      Active Listening & Reflection Utilized.  Verbalization of Feelings Encouraged. Emotional Support Provided. Problem-Solving Interventions Indicated. Solution-Focused Strategies Employed. Acceptance & Commitment Therapy Performed. CSW Collaboration with Significant Other, [name] to Encourage Patient Only Eat a Low-Sodium, Heart Healthy Diet, as Ms.  Moore Often Provides Patient with Meals from Home & [employer].  CSW Collaboration with Significant Other, [name] to Confirm Patient's Continued Attendance at [employer] [phone]), to Receive Dialysis Treatments, on Monday's, Wednesday's & Friday's. CSW Collaboration with Significant Other, [name] to Confirm Patient's Continued Residency at [facility] (# [phone]), Skilled  Nursing/Extended Stay Facility, to [facility].   CSW Collaboration with Significant Other, [name] to [employer] in Continuing to [employer] in The Home for Patient, through [facility] [phone]). CSW Collaboration with Significant Other, [name] to [employer] with Danford Bad, Licensed Clinical Social Worker with [facility] [phone]), if She Has Questions, Needs Assistance, or If Additional Social Work Needs Are Identified Between Now & Our Next Follow-Up Outreach Call, Scheduled on 05/26/2023 at 9:00 AM.      Our next appointment is by telephone on 05/26/2023 at 9:00 am.  Please call the care guide team at [phone] if you need to cancel or reschedule your appointment.   If you are experiencing a Mental Health or Behavioral Health Crisis or need someone to talk to, please call the Suicide and Crisis Lifeline: 988 call the [place] National Suicide Prevention Lifeline: (438)373-9105 or TTY: (210)003-1087 TTY 380-148-6583) to talk to a trained counselor call 1-800-273-TALK (toll free, 24 hour hotline) go to [facility] [address] [place] [phone]) call the [facility] Crisis Line: 412-844-8767 call 911  Patient verbalizes understanding of instructions and care plan provided today and agrees to view in MyChart. Active MyChart status and patient  understanding of how to access instructions and care plan via MyChart confirmed with patient.     Telephone follow up appointment with care management team member scheduled for:  05/26/2023 at 9:00 am.  Danford Bad, BSW, MSW, LCSW  Embedded Practice Social Work Case Manager  [facility], Population Health Direct Dial: [phone]  Fax: [phone] Email: [email or web address] Website: [email or web address]

## 2023-05-26 ENCOUNTER — Encounter: Payer: Self-pay | Admitting: *Deleted

## 2023-05-26 ENCOUNTER — Ambulatory Visit: Payer: Self-pay | Admitting: *Deleted

## 2023-05-26 NOTE — Patient Instructions (Signed)
Visit Information  Thank you for taking time to visit with me today. Please don't hesitate to contact me if I can be of assistance to you.   Following are the goals we discussed today:   Goals Addressed               This Visit's Progress     Receive Assistance Applying for Personal Care Services. (pt-stated)   On track     Care Coordination Interventions:  Interventions Today    Flowsheet Row Most Recent Value  Chronic Disease   Chronic disease during today's visit Diabetes, Congestive Heart Failure (CHF), Chronic Kidney Disease/End Stage Renal Disease (ESRD), Hypertension (HTN), Other  [Major Depression, Coronary Artery Disease, Dysphagia, Diabetic Neuropathy, Right Eye Vision Loss, Left Eye Blurred Vision Loss, Malnutrition of Moderate Degree, Inability to Perform Activities of Daily Living Independently, Frequent Falls.]  General Interventions   General Interventions Discussed/Reviewed General Interventions Discussed, General Interventions Reviewed, Doctor Visits, Communication with, [employer], Level of Care, Horticulturist, commercial (DME), Vaccines, Health Screening  [Communication with Care Team Members.]  Vaccines COVID-19, Flu, Pneumonia, RSV, Shingles, Tetanus/Pertussis/Diphtheria  [Encouraged Routine Vaccinations.]  Doctor Visits Discussed/Reviewed Doctor Visits Discussed, Doctor Visits Reviewed, Annual Wellness Visits, PCP, Specialist  [Encouraged Routine Engagement.]  Health Screening Bone Density, Colonoscopy, Prostate  [Encouraged Routine Health Screenings.]  Durable Medical Equipment (DME) Bed side commode, Shower bench, BP Cuff, Glucomoter, Lift Chair, Oxygen, Environmental consultant, Wheelchair, Other  [Prescription Glasses, Engineer, materials in Air traffic controller, Hand-Held [employer], Hospital Bed.]  Wheelchair Standard  [Encouraged Routine Use.]  PCP/Specialist Visits Compliance with follow-up visit  [Encouraged Routine Engagement.]  Communication with PCP/Specialists, Charity fundraiser, Pharmacists, Social  Work  [employer] Routine Engagement.]  Level of Care Adult Daycare, Air traffic controller, Assisted Living, Skilled Nursing Facility  [Confirmed Disinterest in Enrollment in Adult Day Care Program or Receiving Assistance Pursuing Alternative Placement Arrangements.]  Applications Medicaid, Personal Care Services  [Confirmed Approval for Special Assistance Long-Term Care Medicaid & Disinterest in Applying for Personal Care Services.]  Exercise Interventions   Exercise Discussed/Reviewed Exercise Discussed, Assistive device use and maintanence, Exercise Reviewed, Physical Activity, Weight Managment  [Encouraged Increased Level of Activity & Exercise, as Tolerated.]  Physical Activity Discussed/Reviewed Physical Activity Discussed, PREP, Gym, Physical Activity Reviewed, Types of exercise, Home Exercise Program (HEP)  [Encouraged Daily Exercise Regimen.]  Weight Management Weight loss  [Encouraged.]  Education Interventions   Education Provided Provided Education  [Thoroughly Reviewed & Entertained Questions.]  Provided Verbal Education On Nutrition, Air traffic controller, Development worker, community, Exercise, Medication, When to see the doctor, Mental Health/Coping with Illness, Community Resources  [Encouraged Consideration & Implementation.]  Ship broker, Personal Care Services  [employer] Approval for Special Assistance Long-Term Care Medicaid & Disinterest in Applying for Personal Care Services.]  Mental Health Interventions   Mental Health Discussed/Reviewed Depression, Anxiety, Crisis, Other, Coping Strategies, Suicide, Mental Health Reviewed, Substance Abuse, Grief and Loss, Mental Health Discussed  [Assessed Mental Health & Cognitive Status.]  Nutrition Interventions   Nutrition Discussed/Reviewed Nutrition Discussed, Fluid intake, Portion sizes, Nutrition Reviewed, Decreasing sugar intake, Carbohydrate meal planning, Increasing proteins, Adding fruits and vegetables, Decreasing fats, Decreasing salt  [Encouraged  Heart-Healthly, Low Sodium, Low Fat, Diabetic-Friendly Diet.]  Pharmacy Interventions   Pharmacy Dicussed/Reviewed Pharmacy Topics Discussed, Medication Adherence, Affording Medications, Pharmacy Topics Reviewed, Medications and their functions  [Confirmed Ability to Afford Prescription Medications.]  Medication Adherence --  [Confirmed Prescription Medication Compliance.]  Safety Interventions   Safety Discussed/Reviewed Safety Discussed, Safety Reviewed, Fall Risk  [Working with Physical & Occupational Therapiest at  Skilled Nursing Facility.]  Home Safety Assistive Devices  [Encouraged Consistent Use of Assistive Devices.]  Advanced Directive Interventions   Advanced Directives Discussed/Reviewed Advanced Directives Discussed, Advanced Directives Reviewed      Active Listening & Reflection Utilized.  Verbalization of Feelings Encouraged. Emotional Support Provided. Client-Centered Therapy Initiated. Acceptance & Commitment Therapy Implemented. Cognitive Behavioral Therapy Performed. CSW Collaboration with Significant Other, [name] to Confirm Patient's Continued Attendance at [employer] [phone]) to Receive Dialysis Treatments on Monday's, Wednesday's & Friday's. CSW Collaboration with Significant Other, [name] to Confirm Patient's Continued Residency at [facility] (# [phone]), Skilled Nursing/Extended Stay Facility, to Receive [facility].   CSW Collaboration with Significant Other, [name] to Encourage Routine Engagement with Danford Bad, Licensed Clinical Social Worker with [facility] [phone]), if She Has Questions, Needs Assistance, or If Additional Social Work Needs Are Identified Between Now & Our Next Follow-Up Outreach Call, Scheduled on 06/23/2023 at 9:45 AM.      Our next appointment is by telephone on 06/23/2023 at 9:45 am.  Please call the care guide  team at [phone] if you need to cancel or reschedule your appointment.   If you are experiencing a Mental Health or Behavioral Health Crisis or need someone to talk to, please call the Suicide and Crisis Lifeline: 988 call the [place] National Suicide Prevention Lifeline: 339-866-8866 or TTY: 317-464-6274 TTY 514-013-7212) to talk to a trained counselor call 1-800-273-TALK (toll free, 24 hour hotline) go to [facility] [address] [place] [phone]) call the [facility] Crisis Line: [phone] call 911  Patient verbalizes understanding of instructions and care plan provided today and agrees to view in MyChart. Active MyChart status and patient understanding of how to access instructions and care plan via MyChart confirmed with patient.     Telephone follow up appointment with care management team member scheduled for:  06/23/2023 at 9:45 am.  Danford Bad, BSW, MSW, LCSW  Embedded Practice Social Work Case Manager  [facility], Population Health Direct Dial: [phone]  Fax: [phone] Email: [email or web address]: [email or web address]

## 2023-05-26 NOTE — Patient Outreach (Signed)
Care Coordination   Follow Up Visit Note   05/26/2023  Name: [name] MRN: [id number] DOB: [birth year]  TIMARION TRANA is a 67 y.o. year old male who sees [name], Jonna Coup, MD for primary care. I spoke with patient's significant other, [name] by phone today.  What matters to the patients health and wellness today?  Receive Assistance Applying for Personal Care Services.    Goals Addressed               This Visit's Progress     Receive Assistance Applying for Personal Care Services. (pt-stated)   On track     Care Coordination Interventions:  Interventions Today    Flowsheet Row Most Recent Value  Chronic Disease   Chronic disease during today's visit Diabetes, Congestive Heart Failure (CHF), Chronic Kidney Disease/End Stage Renal Disease (ESRD), Hypertension (HTN), Other  [Major Depression, Coronary Artery Disease, Dysphagia, Diabetic Neuropathy, Right Eye Vision Loss, Left Eye Blurred Vision Loss, Malnutrition of Moderate Degree, Inability to Perform Activities of Daily Living Independently, Frequent Falls.]  General Interventions   General Interventions Discussed/Reviewed General Interventions Discussed, General Interventions Reviewed, Doctor Visits, Communication with, [facility], Level of Care, Horticulturist, commercial (DME), Vaccines, Health Screening  [Communication with Care Team Members.]  Vaccines COVID-19, Flu, Pneumonia, RSV, Shingles, Tetanus/Pertussis/Diphtheria  [Encouraged Routine Vaccinations.]  Doctor Visits Discussed/Reviewed Doctor Visits Discussed, Doctor Visits Reviewed, Annual Wellness Visits, PCP, Specialist  [Encouraged Routine Engagement.]  Health Screening Bone Density, Colonoscopy, Prostate  [Encouraged Routine Health Screenings.]  Durable Medical Equipment (DME) Bed side commode, Shower bench, BP Cuff, Glucomoter, Lift Chair, Oxygen, Environmental consultant, Wheelchair, Other  [Prescription Glasses, Engineer, materials in Air traffic controller, Hand-Held [employer],  Hospital Bed.]  Wheelchair Standard  [Encouraged Routine Use.]  PCP/Specialist Visits Compliance with follow-up visit  [Encouraged Routine Engagement.]  Communication with PCP/Specialists, Charity fundraiser, Pharmacists, Social Work  [employer] Routine Engagement.]  Level of Care Adult Daycare, Air traffic controller, Assisted Living, Skilled Nursing Facility  [Confirmed Disinterest in Enrollment in Adult Day Care Program or Receiving Assistance Pursuing Alternative Placement Arrangements.]  Applications Medicaid, Personal Care Services  [Confirmed Approval for Special Assistance Long-Term Care Medicaid & Disinterest in Applying for Personal Care Services.]  Exercise Interventions   Exercise Discussed/Reviewed Exercise Discussed, Assistive device use and maintanence, Exercise Reviewed, Physical Activity, Weight Managment  [Encouraged Increased Level of Activity & Exercise, as Tolerated.]  Physical Activity Discussed/Reviewed Physical Activity Discussed, PREP, Gym, Physical Activity Reviewed, Types of exercise, Home Exercise Program (HEP)  [Encouraged Daily Exercise Regimen.]  Weight Management Weight loss  [Encouraged.]  Education Interventions   Education Provided Provided Education  [Thoroughly Reviewed & Entertained Questions.]  Provided Verbal Education On Nutrition, Air traffic controller, Development worker, community, Exercise, Medication, When to see the doctor, Mental Health/Coping with Illness, Community Resources  [Encouraged Consideration & Implementation.]  Ship broker, Personal Care Services  [employer] Approval for Special Assistance Long-Term Care Medicaid & Disinterest in Applying for Personal Care Services.]  Mental Health Interventions   Mental Health Discussed/Reviewed Depression, Anxiety, Crisis, Other, Coping Strategies, Suicide, Mental Health Reviewed, Substance Abuse, Grief and Loss, Mental Health Discussed  [Assessed Mental Health & Cognitive Status.]  Nutrition Interventions   Nutrition Discussed/Reviewed  Nutrition Discussed, Fluid intake, Portion sizes, Nutrition Reviewed, Decreasing sugar intake, Carbohydrate meal planning, Increasing proteins, Adding fruits and vegetables, Decreasing fats, Decreasing salt  [Encouraged Heart-Healthly, Low Sodium, Low Fat, Diabetic-Friendly Diet.]  Pharmacy Interventions   Pharmacy Dicussed/Reviewed Pharmacy Topics Discussed, Medication Adherence, Affording Medications, Pharmacy Topics Reviewed, Medications and their functions  [Confirmed Ability  to Afford Prescription Medications.]  Medication Adherence --  [facility] Prescription Medication Compliance.]  Safety Interventions   Safety Discussed/Reviewed Safety Discussed, Safety Reviewed, Fall Risk  [Working with Physical & Occupational Therapiest at Skilled Nursing Facility.]  Home Safety Assistive Devices  [Encouraged Consistent Use of Assistive Devices.]  Advanced Directive Interventions   Advanced Directives Discussed/Reviewed Advanced Directives Discussed, Advanced Directives Reviewed      Active Listening & Reflection Utilized.  Verbalization of Feelings Encouraged. Emotional Support Provided. Client-Centered Therapy Initiated. Acceptance & Commitment Therapy Implemented. Cognitive Behavioral Therapy Performed. CSW Collaboration with Significant Other, [name] to Confirm Patient's Continued Attendance at [facility] [phone]) to Receive Dialysis Treatments on Monday's, Wednesday's & Friday's. CSW Collaboration with Significant Other, [name] to Confirm Patient's Continued Residency at [facility] (# [phone]), Skilled Nursing/Extended Stay Facility, to Receive [facility].   CSW Collaboration with Significant Other, [name] to Encourage Routine Engagement with Danford Bad, Licensed Clinical Social Worker with [facility] [phone]), if She Has Questions, Needs Assistance, or If Additional  Social Work Needs Are Identified Between Now & Our Next Follow-Up Outreach Call, Scheduled on 06/23/2023 at 9:45 AM.        [name] assessments and interventions completed:  Yes.  [name] Interventions Today    Flowsheet Row Most Recent Value  [name] Interventions   Food Insecurity Interventions Intervention Not Indicated  Housing Interventions Intervention Not Indicated  Transportation Interventions Community Resources Provided, Payor Benefit, Patient Resources (Friends/Family)  Utilities Interventions Intervention Not Indicated  Alcohol Usage Interventions Intervention Not Indicated (Score <7)  Financial Strain Interventions Intervention Not Indicated  Physical Activity Interventions Intervention Not Indicated  Stress Interventions Intervention Not Indicated, Community Resources Provided, [employer], Provide Counseling  Social Connections Interventions Intervention Not Indicated  Health Literacy Interventions Intervention Not Indicated     Care Coordination Interventions:  Yes, provided.   Follow up plan: Follow up call scheduled for 06/23/2023 at 9:45 am.  Encounter Outcome:  Patient Visit Completed.   Danford Bad, BSW, MSW, Printmaker Social Work Case Set designer Health  [facility], Population Health Direct Dial: [phone]  Fax: [phone] Email: [email or web address]: [email or web address]

## 2023-05-31 ENCOUNTER — Emergency Department (HOSPITAL_COMMUNITY): Payer: Medicare Other

## 2023-05-31 ENCOUNTER — Other Ambulatory Visit: Payer: Self-pay

## 2023-05-31 ENCOUNTER — Encounter (HOSPITAL_COMMUNITY): Payer: Self-pay

## 2023-05-31 ENCOUNTER — Emergency Department (HOSPITAL_COMMUNITY)
Admission: EM | Admit: 2023-05-31 | Discharge: 2023-06-01 | Disposition: A | Discharge status: Home / Self Care | Payer: Medicare Other

## 2023-05-31 DIAGNOSIS — Z8501 Personal history of malignant neoplasm of esophagus: Secondary | ICD-10-CM | POA: Insufficient documentation

## 2023-05-31 DIAGNOSIS — Z992 Dependence on renal dialysis: Secondary | ICD-10-CM | POA: Diagnosis not present

## 2023-05-31 DIAGNOSIS — W19XXXA Unspecified fall, initial encounter: Secondary | ICD-10-CM | POA: Diagnosis not present

## 2023-05-31 DIAGNOSIS — Z794 Long term (current) use of insulin: Secondary | ICD-10-CM | POA: Insufficient documentation

## 2023-05-31 DIAGNOSIS — Z23 Encounter for immunization: Secondary | ICD-10-CM | POA: Insufficient documentation

## 2023-05-31 DIAGNOSIS — M79604 Pain in right leg: Secondary | ICD-10-CM | POA: Insufficient documentation

## 2023-05-31 DIAGNOSIS — M542 Cervicalgia: Secondary | ICD-10-CM | POA: Diagnosis not present

## 2023-05-31 DIAGNOSIS — N186 End stage renal disease: Secondary | ICD-10-CM | POA: Insufficient documentation

## 2023-05-31 DIAGNOSIS — S0081XA Abrasion of other part of head, initial encounter: Secondary | ICD-10-CM | POA: Diagnosis not present

## 2023-05-31 DIAGNOSIS — Y92129 Unspecified place in nursing home as the place of occurrence of the external cause: Secondary | ICD-10-CM | POA: Insufficient documentation

## 2023-05-31 DIAGNOSIS — S0990XA Unspecified injury of head, initial encounter: Secondary | ICD-10-CM | POA: Diagnosis present

## 2023-05-31 LAB — CBC WITH DIFFERENTIAL/PLATELET
Abs Immature Granulocytes: 0.02 10*3/uL (ref 0.00–0.07)
Basophils Absolute: 0 10*3/uL (ref 0.0–0.1)
Basophils Relative: 1 %
Eosinophils Absolute: 0.2 10*3/uL (ref 0.0–0.5)
Eosinophils Relative: 2 %
HCT: 35.1 % — ABNORMAL LOW (ref 39.0–52.0)
Hemoglobin: 10.6 g/dL — ABNORMAL LOW (ref 13.0–17.0)
Immature Granulocytes: 0 %
Lymphocytes Relative: 11 %
Lymphs Abs: 0.8 10*3/uL (ref 0.7–4.0)
MCH: 25.2 pg — ABNORMAL LOW (ref 26.0–34.0)
MCHC: 30.2 g/dL (ref 30.0–36.0)
MCV: 83.6 fL (ref 80.0–100.0)
Monocytes Absolute: 0.6 10*3/uL (ref 0.1–1.0)
Monocytes Relative: 9 %
Neutro Abs: 5.1 10*3/uL (ref 1.7–7.7)
Neutrophils Relative %: 77 %
Platelets: 218 10*3/uL (ref 150–400)
RBC: 4.2 MIL/uL — ABNORMAL LOW (ref 4.22–5.81)
RDW: 18.6 % — ABNORMAL HIGH (ref 11.5–15.5)
WBC: 6.7 10*3/uL (ref 4.0–10.5)
nRBC: 0 % (ref 0.0–0.2)

## 2023-05-31 LAB — COMPREHENSIVE METABOLIC PANEL
ALT: 7 U/L (ref 0–44)
AST: 11 U/L — ABNORMAL LOW (ref 15–41)
Albumin: 3.2 g/dL — ABNORMAL LOW (ref 3.5–5.0)
Alkaline Phosphatase: 77 U/L (ref 38–126)
Anion gap: 11 (ref 5–15)
BUN: 18 mg/dL (ref 8–23)
CO2: 29 mmol/L (ref 22–32)
Calcium: 8.8 mg/dL — ABNORMAL LOW (ref 8.9–10.3)
Chloride: 97 mmol/L — ABNORMAL LOW (ref 98–111)
Creatinine, Ser: 2.58 mg/dL — ABNORMAL HIGH (ref 0.61–1.24)
GFR, Estimated: 27 mL/min — ABNORMAL LOW (ref >60)
Glucose, Bld: 175 mg/dL — ABNORMAL HIGH (ref 70–99)
Potassium: 3.6 mmol/L (ref 3.5–5.1)
Sodium: 137 mmol/L (ref 135–145)
Total Bilirubin: 0.9 mg/dL (ref <1.2)
Total Protein: 6.6 g/dL (ref 6.5–8.1)

## 2023-05-31 LAB — TROPONIN I (HIGH SENSITIVITY): Troponin I (High Sensitivity): 19 ng/L — ABNORMAL HIGH (ref <18)

## 2023-05-31 MED ORDER — TETANUS-DIPHTH-ACELL PERTUSSIS 5-2.5-18.5 LF-MCG/0.5 IM SUSY
0.5000 mL | PREFILLED_SYRINGE | Freq: Once | INTRAMUSCULAR | Status: AC
  Administered 2023-05-31: 0.5 mL via INTRAMUSCULAR
  Filled 2023-05-31: qty 0.5

## 2023-05-31 NOTE — ED Triage Notes (Signed)
Patient BIB [facility] from [facility] care for unwitnessed mechanical fall off his w/c.  Patient fell forward on his face and was found approx 15 min later.  Patient has hematoma and laceration to forehead, bleeding controlled.  Patient is A+Ox3 at baseline per facility staff, denies LOC.  Patient c/o headache and chronic R leg pain.  C-collar in place, no reported blood thinners

## 2023-05-31 NOTE — ED Provider Notes (Signed)
EMERGENCY DEPARTMENT AT [facility] Provider Note   CSN: [id number] Arrival date & time: 05/31/23  2028     History {Add pertinent medical, surgical, social history, OB history to HPI:1} Chief Complaint  Patient presents with   [name] is a 67 y.o. male, hx of esophageal adenocarcinoma, ESRD on dialysis,     Unwitnessed fall, wheelchair bound  Not on any blood thinners   Home Medications Prior to Admission medications   Medication Sig Start Date End Date Taking? Authorizing Provider  amiodarone (PACERONE) 200 MG tablet Take 1 tablet (200 mg total) by mouth 2 (two) times daily. 02/16/23   Ghimire, Werner Lean, MD  calcium acetate (PHOSLO) 667 MG capsule Take 667 mg by mouth See admin instructions. Take 667 mg by mouth twice daily on Monday, Wednesday, and Friday    [provider]  Calcium Acetate 667 MG TABS Take 667 mg by mouth See admin instructions. Take 667 mg by mouth three times daily on Tuesday, Thursday, Saturday, and Sunday    [provider]  EPINEPHrine 0.3 mg/0.3 mL IJ SOAJ injection Inject 0.3 mg into the muscle as needed for anaphylaxis. 01/25/21   Babs Sciara, MD  Evolocumab (REPATHA SURECLICK) 140 MG/ML SOAJ Inject 140 mg into the skin every 14 (fourteen) days. 06/29/22   Alver Sorrow, NP  fentaNYL (DURAGESIC) 25 MCG/HR Place 1 patch onto the skin every 3 (three) days. 02/16/23   Ghimire, Werner Lean, MD  gabapentin (NEURONTIN) 100 MG capsule Take 100 mg by mouth at bedtime.    [provider]  hydrALAZINE (APRESOLINE) 25 MG tablet Take 1 tablet (25 mg total) by mouth every 8 (eight) hours. 02/16/23   Ghimire, Werner Lean, MD  insulin lispro (HUMALOG KWIKPEN) 100 UNIT/ML KwikPen Inject 0-15 Units into the skin 3 (three) times daily. BS 0-120 inject 0 units BS 121-150 inject 2 units  BS 151-200 inject 3 units  BS 201-250 inject 5 units  BS 251-300 inject 8 units  BS 301-350 inject 11 units  BS 351-400  inject 15 units BS 401-999 inject 17 units and call provider    [provider]  isosorbide mononitrate (IMDUR) 30 MG 24 hr tablet Take 1 tablet (30 mg total) by mouth daily. 02/16/23   Ghimire, Werner Lean, MD  levothyroxine (SYNTHROID) 25 MCG tablet Take 25 mcg by mouth daily.    [provider]  metoprolol succinate (TOPROL-XL) 50 MG 24 hr tablet Take 1 tablet (50 mg total) by mouth daily. Take with or immediately following a meal. 02/16/23   Ghimire, Werner Lean, MD  ondansetron (ZOFRAN) 4 MG/2ML SOLN injection Inject 4 mg into the vein daily. PRN order: inject 4 mg every 8 hours as needed for nausea and vomiting    [provider]  OXYGEN Inhale 2 L into the lungs continuous.    [provider]  pantoprazole (PROTONIX) 40 MG tablet Take 1 tablet (40 mg total) by mouth 2 (two) times daily. 05/30/22   Catarina Hartshorn, MD  polyethylene glycol (MIRALAX) 17 g packet Take 17 g by mouth daily. 11/29/22   Evette Georges, MD  sertraline (ZOLOFT) 100 MG tablet Take 1 tablet (100 mg total) by mouth daily. 11/29/22   Evette Georges, MD  UNABLE TO FIND Take 1 Dose by mouth in the morning and at bedtime. Med Name: Med plus 2.0    [provider]      Allergies    Bee  venom, Atorvastatin, Diltiazem, Lopressor [metoprolol], Oxycodone, Reglan [metoclopramide], Rosuvastatin, and Valsartan    Review of Systems   Review of Systems  Physical Exam Updated Vital Signs BP (!) 156/70 (BP Location: Right Arm)   Pulse 63   Temp 99 F (37.2 C) (Oral)   Resp 18   Ht 5\' 10"  (1.778 m)   Wt 74 kg   SpO2 96%   BMI 23.41 kg/m  Physical Exam  ED Results / Procedures / Treatments   Labs (all labs ordered are listed, but only abnormal results are displayed) Labs Reviewed - No data to display  EKG None  Radiology No results found.  Procedures Procedures  {Document cardiac monitor, telemetry assessment procedure when appropriate:1}  Medications Ordered in ED Medications - No  data to display  ED Course/ Medical Decision Making/ A&P   {   Click here for ABCD2, HEART and other calculatorsREFRESH Note before signing :1}                              Medical Decision Making Amount and/or Complexity of Data Reviewed Labs: ordered. Radiology: ordered.  Risk Prescription drug management.   ***  {Document critical care time when appropriate:1} {Document review of labs and clinical decision tools ie heart score, Chads2Vasc2 etc:1}  {Document your independent review of radiology images, and any outside records:1} {Document your discussion with family members, caretakers, and with consultants:1} {Document social determinants of health affecting pt's care:1} {Document your decision making why or why not admission, treatments were needed:1} Final Clinical Impression(s) / ED Diagnoses Final diagnoses:  None    Rx / DC Orders ED Discharge Orders     None

## 2023-05-31 NOTE — ED Notes (Signed)
POA updated on patient's condition

## 2023-06-01 ENCOUNTER — Emergency Department (HOSPITAL_COMMUNITY): Payer: Medicare Other

## 2023-06-01 DIAGNOSIS — S0081XA Abrasion of other part of head, initial encounter: Secondary | ICD-10-CM | POA: Diagnosis not present

## 2023-06-01 LAB — TROPONIN I (HIGH SENSITIVITY): Troponin I (High Sensitivity): 19 ng/L — ABNORMAL HIGH (ref <18)

## 2023-06-01 NOTE — ED Provider Notes (Signed)
Patient signed out to me at shift change.  No fracture seen on CT of knee.  Patient is wheel chair bound.  He states that he is ready to go home.   Roxy Horseman, PA-C 06/01/23 0200    Gilda Crease, MD 06/01/23 [phone]

## 2023-06-01 NOTE — ED Notes (Signed)
Report given to Receiving staff member stephanie, PTAR called, Med req completed.

## 2023-06-22 ENCOUNTER — Ambulatory Visit: Payer: Self-pay | Admitting: *Deleted

## 2023-06-22 NOTE — Patient Instructions (Signed)
 Visit Information  Thank you for taking time to visit with me today. Please don't hesitate to contact me if I can be of assistance to you.   Following are the goals we discussed today:   Goals Addressed               This Visit's Progress     COMPLETED: Receive Assistance Applying for Personal Care Services. (pt-stated)   On track     Care Coordination Interventions:  Interventions Today    Flowsheet Row Most Recent Value  Chronic Disease   Chronic disease during today's visit Diabetes, Congestive Heart Failure (CHF), Chronic Kidney Disease/End Stage Renal Disease (ESRD), Hypertension (HTN), Other  [Major Depression, Coronary Artery Disease, Dysphagia, Diabetic Neuropathy, Right Eye Vision Loss, Left Eye Blurred Vision Loss, Malnutrition of Moderate Degree, Inability to Perform Activities of Daily Living Independently, Frequent Falls.]  General Interventions   General Interventions Discussed/Reviewed General Interventions Discussed, General Interventions Reviewed, Doctor Visits, Communication with, [facility], Level of Care, Horticulturist, Commercial (DME), Vaccines, Health Screening  [Communication with Care Team Members.]  Vaccines COVID-19, Flu, Pneumonia, RSV, Shingles, Tetanus/Pertussis/Diphtheria  [Encouraged Routine Vaccinations.]  Doctor Visits Discussed/Reviewed Doctor Visits Discussed, Doctor Visits Reviewed, Annual Wellness Visits, PCP, Specialist  [Encouraged Routine Engagement.]  Health Screening Bone Density, Colonoscopy, Prostate  [Encouraged Routine Health Screenings.]  Durable Medical Equipment (DME) Bed side commode, Shower bench, BP Cuff, Glucomoter, Lift Chair, Oxygen, Environmental Consultant, Wheelchair, Other  [Prescription Glasses, Engineer, Materials in Air Traffic Controller, Hand-Held [employer], Hospital Bed.]  Wheelchair Standard  [Encouraged Routine Use.]  PCP/Specialist Visits Compliance with follow-up visit  [Encouraged Routine Engagement.]  Communication with PCP/Specialists, CHARITY FUNDRAISER,  Pharmacists, Social Work  [employer] Routine Engagement.]  Level of Care Adult Daycare, Air Traffic Controller, Assisted Living, Skilled Nursing Facility  [Confirmed Disinterest in Enrollment in Adult Day Care Program or Receiving Assistance Pursuing Alternative Placement Arrangements.]  Applications Medicaid, Personal Care Services  [Confirmed Approval for Special Assistance Long-Term Care Medicaid & Disinterest in Applying for Personal Care Services.]  Exercise Interventions   Exercise Discussed/Reviewed Exercise Discussed, Assistive device use and maintanence, Exercise Reviewed, Physical Activity, Weight Managment  [Encouraged Increased Level of Activity & Exercise, as Tolerated.]  Physical Activity Discussed/Reviewed Physical Activity Discussed, PREP, Gym, Physical Activity Reviewed, Types of exercise, Home Exercise Program (HEP)  [Encouraged Daily Exercise Regimen.]  Weight Management Weight loss  [Encouraged.]  Education Interventions   Education Provided Provided Education  [Thoroughly Reviewed & Entertained Questions.]  Provided Verbal Education On Nutrition, Air Traffic Controller, Development Worker, Community, Exercise, Medication, When to see the doctor, Mental Health/Coping with Illness, Community Resources  [Encouraged Consideration & Implementation.]  Ship Broker, Personal Care Services  [employer] Approval for Special Assistance Long-Term Care Medicaid & Disinterest in Applying for Personal Care Services.]  Mental Health Interventions   Mental Health Discussed/Reviewed Depression, Anxiety, Crisis, Other, Coping Strategies, Suicide, Mental Health Reviewed, Substance Abuse, Grief and Loss, Mental Health Discussed  [Assessed Mental Health & Cognitive Status.]  Nutrition Interventions   Nutrition Discussed/Reviewed Nutrition Discussed, Fluid intake, Portion sizes, Nutrition Reviewed, Decreasing sugar intake, Carbohydrate meal planning, Increasing proteins, Adding fruits and vegetables, Decreasing fats, Decreasing  salt  [Encouraged Heart-Healthly, Low Sodium, Low Fat, Diabetic-Friendly Diet.]  Pharmacy Interventions   Pharmacy Dicussed/Reviewed Pharmacy Topics Discussed, Medication Adherence, Affording Medications, Pharmacy Topics Reviewed, Medications and their functions  [Confirmed Ability to Afford Prescription Medications.]  Medication Adherence --  [Confirmed Prescription Medication Compliance.]  Safety Interventions   Safety Discussed/Reviewed Safety Discussed, Safety Reviewed, Fall Risk  [Working with Physical & Occupational Therapiest  at Skilled Nursing Facility.]  Home Safety Assistive Devices  [Encouraged Consistent Use of Assistive Devices.]  Advanced Directive Interventions   Advanced Directives Discussed/Reviewed Advanced Directives Discussed, Advanced Directives Reviewed      Active Listening & Reflection Utilized.  Verbalization of Feelings Encouraged. Emotional Support Provided. Client-Centered Therapy Initiated. Acceptance & Commitment Therapy Implemented. Cognitive Behavioral Therapy Performed. CSW Collaboration with Significant Other, Reubin Ada to Confirm Patient's Continued Attendance at [facility] [phone]) to Receive Dialysis Treatments on Monday's, Wednesday's & Friday's. CSW Collaboration with Significant Other, Reubin Ada to Confirm Patient's Continued Residency at [facility] (# [phone]), Skilled Nursing/Extended Stay Facility, to Receive [facility].   CSW Collaboration with Significant Other, Reubin Ada to [employer] with Philippe Desanctis, Licensed Clinical Social Worker with [facility] [phone]), if She Has Questions, Needs Assistance, or If Additional Social Work Needs Are Identified in The Near Future.       Please call the care guide team at [phone] if you need to cancel or reschedule your appointment.   If you are experiencing a Mental Health or  Behavioral Health Crisis or need someone to talk to, please call the Suicide and Crisis Lifeline: 988 call the [place]  National Suicide Prevention Lifeline: (915)585-8514 or TTY: 678-565-0667 TTY (318)829-3937) to talk to a trained counselor call 1-800-273-TALK (toll free, 24 hour hotline) go to [facility] [address], [place] [phone]) call the [facility] Crisis Line: (703) 754-0953 call 911  Patient verbalizes understanding of instructions and care plan provided today and agrees to view in MyChart. Active MyChart status and patient understanding of how to access instructions and care plan via MyChart confirmed with patient.     No further follow up required.  Philippe Desanctis, BSW, MSW, Printmaker Social Work Case Set Designer Health  [facility], Population Health Direct Dial: [phone]  Fax: [phone] Email: [email or web address] Website: [email or web address]

## 2023-06-22 NOTE — Patient Outreach (Signed)
 Care Coordination   Follow Up Visit Note   06/22/2023  Name: [name] MRN: [id number] DOB: [birth year]  [name] is a 68 y.o. year old male who sees [name], Glendia LABOR, MD for primary care. I spoke with patient's significant other, [name] by phone today.  What matters to the patients health and wellness today?  Remain at [facility] to Receive [facility].    Goals Addressed               This Visit's Progress     COMPLETED: Receive Assistance Applying for Personal Care Services. (pt-stated)   On track     Care Coordination Interventions:  Interventions Today    Flowsheet Row Most Recent Value  Chronic Disease   Chronic disease during today's visit Diabetes, Congestive Heart Failure (CHF), Chronic Kidney Disease/End Stage Renal Disease (ESRD), Hypertension (HTN), Other  [Major Depression, Coronary Artery Disease, Dysphagia, Diabetic Neuropathy, Right Eye Vision Loss, Left Eye Blurred Vision Loss, Malnutrition of Moderate Degree, Inability to Perform Activities of Daily Living Independently, Frequent Falls.]  General Interventions   General Interventions Discussed/Reviewed General Interventions Discussed, General Interventions Reviewed, Doctor Visits, Communication with, [employer], Level of Care, Horticulturist, Commercial (DME), Vaccines, Health Screening  [Communication with Care Team Members.]  Vaccines COVID-19, Flu, Pneumonia, RSV, Shingles, Tetanus/Pertussis/Diphtheria  [Encouraged Routine Vaccinations.]  Doctor Visits Discussed/Reviewed Doctor Visits Discussed, Doctor Visits Reviewed, Annual Wellness Visits, PCP, Specialist  [Encouraged Routine Engagement.]  Health Screening Bone Density, Colonoscopy, Prostate  [Encouraged Routine Health Screenings.]  Durable Medical Equipment (DME) Bed side commode, Shower bench, BP Cuff, Glucomoter, Lift Chair, Oxygen, Environmental Consultant, Wheelchair, Other  [Prescription Glasses, Engineer, Materials in Air Traffic Controller,  Hand-Held [employer], Hospital Bed.]  Wheelchair Standard  [Encouraged Routine Use.]  PCP/Specialist Visits Compliance with follow-up visit  [Encouraged Routine Engagement.]  Communication with PCP/Specialists, CHARITY FUNDRAISER, Pharmacists, Social Work  [employer] Routine Engagement.]  Level of Care Adult Daycare, Air Traffic Controller, Assisted Living, Skilled Nursing Facility  [Confirmed Disinterest in Enrollment in Adult Day Care Program or Receiving Assistance Pursuing Alternative Placement Arrangements.]  Applications Medicaid, Personal Care Services  [Confirmed Approval for Special Assistance Long-Term Care Medicaid & Disinterest in Applying for Personal Care Services.]  Exercise Interventions   Exercise Discussed/Reviewed Exercise Discussed, Assistive device use and maintanence, Exercise Reviewed, Physical Activity, Weight Managment  [Encouraged Increased Level of Activity & Exercise, as Tolerated.]  Physical Activity Discussed/Reviewed Physical Activity Discussed, PREP, Gym, Physical Activity Reviewed, Types of exercise, Home Exercise Program (HEP)  [Encouraged Daily Exercise Regimen.]  Weight Management Weight loss  [Encouraged.]  Education Interventions   Education Provided Provided Education  [Thoroughly Reviewed & Entertained Questions.]  Provided Verbal Education On Nutrition, Air Traffic Controller, Development Worker, Community, Exercise, Medication, When to see the doctor, Mental Health/Coping with Illness, Community Resources  [Encouraged Consideration & Implementation.]  Ship Broker, Personal Care Services  [employer] Approval for Special Assistance Long-Term Care Medicaid & Disinterest in Applying for Personal Care Services.]  Mental Health Interventions   Mental Health Discussed/Reviewed Depression, Anxiety, Crisis, Other, Coping Strategies, Suicide, Mental Health Reviewed, Substance Abuse, Grief and Loss, Mental Health Discussed  [Assessed Mental Health & Cognitive Status.]  Nutrition Interventions   Nutrition  Discussed/Reviewed Nutrition Discussed, Fluid intake, Portion sizes, Nutrition Reviewed, Decreasing sugar intake, Carbohydrate meal planning, Increasing proteins, Adding fruits and vegetables, Decreasing fats, Decreasing salt  [Encouraged Heart-Healthly, Low Sodium, Low Fat, Diabetic-Friendly Diet.]  Pharmacy Interventions   Pharmacy Dicussed/Reviewed Pharmacy Topics Discussed, Medication Adherence, Affording Medications, Pharmacy Topics Reviewed, Medications and  their functions  [Confirmed Ability to [employer] Prescription Medications.]  Medication Adherence --  [employer] Prescription Medication Compliance.]  Safety Interventions   Safety Discussed/Reviewed Safety Discussed, Safety Reviewed, Fall Risk  [Working with Physical & Occupational Therapiest at Skilled Nursing Facility.]  Home Safety Assistive Devices  [Encouraged Consistent Use of Assistive Devices.]  Advanced Directive Interventions   Advanced Directives Discussed/Reviewed Advanced Directives Discussed, Advanced Directives Reviewed      Active Listening & Reflection Utilized.  Verbalization of Feelings Encouraged. Emotional Support Provided. Client-Centered Therapy Initiated. Acceptance & Commitment Therapy Implemented. Cognitive Behavioral Therapy Performed. CSW Collaboration with Significant Other, [name] to Confirm Patient's Continued Attendance at [facility] [phone]) to Receive Dialysis Treatments on Monday's, Wednesday's & Friday's. CSW Collaboration with Significant Other, [name] to Confirm Patient's Continued Residency at [facility] (# [phone]), Skilled Nursing/Extended Stay Facility, to Receive [facility].   CSW Collaboration with Significant Other, [name] to [employer] with Philippe Desanctis, Licensed Clinical Social Worker with [facility] [phone]), if She Has Questions, Needs Assistance, or If  Additional Social Work Needs Are Identified in The Near Future.       [name] assessments and interventions completed:  Yes.  Care Coordination Interventions:  Yes, provided.   Follow up plan: No further intervention required.   Encounter Outcome:  Patient Visit Completed.   Philippe Desanctis, BSW, MSW, Printmaker Social Work Case Set Designer Health  [facility], Population Health Direct Dial: [phone]  Fax: [phone] Email: [email or web address]: [email or web address]

## 2023-06-23 ENCOUNTER — Encounter: Payer: Self-pay | Admitting: *Deleted

## 2023-06-27 ENCOUNTER — Other Ambulatory Visit: Payer: Self-pay

## 2023-06-27 ENCOUNTER — Emergency Department (HOSPITAL_COMMUNITY): Payer: Medicare Other

## 2023-06-27 ENCOUNTER — Inpatient Hospital Stay (HOSPITAL_COMMUNITY)
Admission: EM | Admit: 2023-06-27 | Discharge: 2023-07-14 | DRG: 064 | Disposition: A | Discharge status: Hospice | Payer: Medicare Other | Source: Skilled Nursing Facility | Attending: Internal Medicine | Admitting: Internal Medicine

## 2023-06-27 DIAGNOSIS — L89511 Pressure ulcer of right ankle, stage 1: Secondary | ICD-10-CM | POA: Diagnosis present

## 2023-06-27 DIAGNOSIS — I330 Acute and subacute infective endocarditis: Secondary | ICD-10-CM | POA: Diagnosis present

## 2023-06-27 DIAGNOSIS — D631 Anemia in chronic kidney disease: Secondary | ICD-10-CM | POA: Diagnosis present

## 2023-06-27 DIAGNOSIS — I2510 Atherosclerotic heart disease of native coronary artery without angina pectoris: Secondary | ICD-10-CM | POA: Diagnosis present

## 2023-06-27 DIAGNOSIS — Z7401 Bed confinement status: Secondary | ICD-10-CM

## 2023-06-27 DIAGNOSIS — J9811 Atelectasis: Secondary | ICD-10-CM | POA: Diagnosis present

## 2023-06-27 DIAGNOSIS — R7881 Bacteremia: Secondary | ICD-10-CM | POA: Diagnosis present

## 2023-06-27 DIAGNOSIS — G8194 Hemiplegia, unspecified affecting left nondominant side: Secondary | ICD-10-CM | POA: Diagnosis present

## 2023-06-27 DIAGNOSIS — Z87891 Personal history of nicotine dependence: Secondary | ICD-10-CM

## 2023-06-27 DIAGNOSIS — G4733 Obstructive sleep apnea (adult) (pediatric): Secondary | ICD-10-CM | POA: Diagnosis present

## 2023-06-27 DIAGNOSIS — Z800 Family history of malignant neoplasm of digestive organs: Secondary | ICD-10-CM

## 2023-06-27 DIAGNOSIS — Z515 Encounter for palliative care: Secondary | ICD-10-CM

## 2023-06-27 DIAGNOSIS — Z993 Dependence on wheelchair: Secondary | ICD-10-CM

## 2023-06-27 DIAGNOSIS — K5900 Constipation, unspecified: Secondary | ICD-10-CM | POA: Diagnosis not present

## 2023-06-27 DIAGNOSIS — Z66 Do not resuscitate: Secondary | ICD-10-CM | POA: Diagnosis present

## 2023-06-27 DIAGNOSIS — Z794 Long term (current) use of insulin: Secondary | ICD-10-CM

## 2023-06-27 DIAGNOSIS — I5042 Chronic combined systolic (congestive) and diastolic (congestive) heart failure: Secondary | ICD-10-CM | POA: Diagnosis present

## 2023-06-27 DIAGNOSIS — J90 Pleural effusion, not elsewhere classified: Secondary | ICD-10-CM | POA: Diagnosis not present

## 2023-06-27 DIAGNOSIS — R54 Age-related physical debility: Secondary | ICD-10-CM | POA: Diagnosis present

## 2023-06-27 DIAGNOSIS — R4701 Aphasia: Secondary | ICD-10-CM | POA: Diagnosis present

## 2023-06-27 DIAGNOSIS — N186 End stage renal disease: Secondary | ICD-10-CM | POA: Diagnosis present

## 2023-06-27 DIAGNOSIS — Z885 Allergy status to narcotic agent status: Secondary | ICD-10-CM

## 2023-06-27 DIAGNOSIS — I6340 Cerebral infarction due to embolism of unspecified cerebral artery: Principal | ICD-10-CM | POA: Diagnosis present

## 2023-06-27 DIAGNOSIS — G894 Chronic pain syndrome: Secondary | ICD-10-CM | POA: Diagnosis present

## 2023-06-27 DIAGNOSIS — R1310 Dysphagia, unspecified: Secondary | ICD-10-CM | POA: Diagnosis present

## 2023-06-27 DIAGNOSIS — E785 Hyperlipidemia, unspecified: Secondary | ICD-10-CM | POA: Diagnosis present

## 2023-06-27 DIAGNOSIS — E1122 Type 2 diabetes mellitus with diabetic chronic kidney disease: Secondary | ICD-10-CM | POA: Diagnosis present

## 2023-06-27 DIAGNOSIS — E1165 Type 2 diabetes mellitus with hyperglycemia: Secondary | ICD-10-CM | POA: Diagnosis present

## 2023-06-27 DIAGNOSIS — Z79899 Other long term (current) drug therapy: Secondary | ICD-10-CM

## 2023-06-27 DIAGNOSIS — T402X5A Adverse effect of other opioids, initial encounter: Secondary | ICD-10-CM | POA: Diagnosis present

## 2023-06-27 DIAGNOSIS — E119 Type 2 diabetes mellitus without complications: Secondary | ICD-10-CM

## 2023-06-27 DIAGNOSIS — Z79890 Hormone replacement therapy (postmenopausal): Secondary | ICD-10-CM

## 2023-06-27 DIAGNOSIS — J690 Pneumonitis due to inhalation of food and vomit: Secondary | ICD-10-CM | POA: Diagnosis present

## 2023-06-27 DIAGNOSIS — I132 Hypertensive heart and chronic kidney disease with heart failure and with stage 5 chronic kidney disease, or end stage renal disease: Secondary | ICD-10-CM | POA: Diagnosis present

## 2023-06-27 DIAGNOSIS — I10 Essential (primary) hypertension: Secondary | ICD-10-CM | POA: Diagnosis present

## 2023-06-27 DIAGNOSIS — Z91030 Bee allergy status: Secondary | ICD-10-CM

## 2023-06-27 DIAGNOSIS — F39 Unspecified mood [affective] disorder: Secondary | ICD-10-CM | POA: Diagnosis present

## 2023-06-27 DIAGNOSIS — E1140 Type 2 diabetes mellitus with diabetic neuropathy, unspecified: Secondary | ICD-10-CM | POA: Diagnosis present

## 2023-06-27 DIAGNOSIS — H5461 Unqualified visual loss, right eye, normal vision left eye: Secondary | ICD-10-CM | POA: Diagnosis present

## 2023-06-27 DIAGNOSIS — I471 Supraventricular tachycardia, unspecified: Secondary | ICD-10-CM | POA: Diagnosis present

## 2023-06-27 DIAGNOSIS — N2581 Secondary hyperparathyroidism of renal origin: Secondary | ICD-10-CM | POA: Diagnosis present

## 2023-06-27 DIAGNOSIS — R7989 Other specified abnormal findings of blood chemistry: Secondary | ICD-10-CM

## 2023-06-27 DIAGNOSIS — I639 Cerebral infarction, unspecified: Secondary | ICD-10-CM | POA: Diagnosis not present

## 2023-06-27 DIAGNOSIS — L89152 Pressure ulcer of sacral region, stage 2: Secondary | ICD-10-CM | POA: Diagnosis present

## 2023-06-27 DIAGNOSIS — Z5320 Procedure and treatment not carried out because of patient's decision for unspecified reasons: Secondary | ICD-10-CM | POA: Diagnosis present

## 2023-06-27 DIAGNOSIS — Z860100 Personal history of colon polyps, unspecified: Secondary | ICD-10-CM

## 2023-06-27 DIAGNOSIS — G9349 Other encephalopathy: Secondary | ICD-10-CM | POA: Diagnosis present

## 2023-06-27 DIAGNOSIS — Z992 Dependence on renal dialysis: Secondary | ICD-10-CM

## 2023-06-27 DIAGNOSIS — N390 Urinary tract infection, site not specified: Secondary | ICD-10-CM | POA: Diagnosis present

## 2023-06-27 DIAGNOSIS — I252 Old myocardial infarction: Secondary | ICD-10-CM

## 2023-06-27 DIAGNOSIS — I6521 Occlusion and stenosis of right carotid artery: Secondary | ICD-10-CM | POA: Diagnosis present

## 2023-06-27 DIAGNOSIS — I3481 Nonrheumatic mitral (valve) annulus calcification: Secondary | ICD-10-CM | POA: Diagnosis present

## 2023-06-27 DIAGNOSIS — B9562 Methicillin resistant Staphylococcus aureus infection as the cause of diseases classified elsewhere: Secondary | ICD-10-CM | POA: Diagnosis present

## 2023-06-27 DIAGNOSIS — Z79891 Long term (current) use of opiate analgesic: Secondary | ICD-10-CM

## 2023-06-27 DIAGNOSIS — Z9981 Dependence on supplemental oxygen: Secondary | ICD-10-CM

## 2023-06-27 DIAGNOSIS — Z91158 Patient's noncompliance with renal dialysis for other reason: Secondary | ICD-10-CM

## 2023-06-27 DIAGNOSIS — Z833 Family history of diabetes mellitus: Secondary | ICD-10-CM

## 2023-06-27 DIAGNOSIS — Z8249 Family history of ischemic heart disease and other diseases of the circulatory system: Secondary | ICD-10-CM

## 2023-06-27 DIAGNOSIS — R29716 NIHSS score 16: Secondary | ICD-10-CM | POA: Diagnosis present

## 2023-06-27 DIAGNOSIS — Z8501 Personal history of malignant neoplasm of esophagus: Secondary | ICD-10-CM

## 2023-06-27 DIAGNOSIS — G928 Other toxic encephalopathy: Secondary | ICD-10-CM | POA: Diagnosis present

## 2023-06-27 DIAGNOSIS — G253 Myoclonus: Secondary | ICD-10-CM | POA: Diagnosis present

## 2023-06-27 DIAGNOSIS — K2270 Barrett's esophagus without dysplasia: Secondary | ICD-10-CM | POA: Diagnosis present

## 2023-06-27 DIAGNOSIS — K219 Gastro-esophageal reflux disease without esophagitis: Secondary | ICD-10-CM | POA: Diagnosis present

## 2023-06-27 DIAGNOSIS — L8990 Pressure ulcer of unspecified site, unspecified stage: Secondary | ICD-10-CM | POA: Insufficient documentation

## 2023-06-27 DIAGNOSIS — Z888 Allergy status to other drugs, medicaments and biological substances status: Secondary | ICD-10-CM

## 2023-06-27 DIAGNOSIS — Z87892 Personal history of anaphylaxis: Secondary | ICD-10-CM

## 2023-06-27 DIAGNOSIS — L89621 Pressure ulcer of left heel, stage 1: Secondary | ICD-10-CM | POA: Diagnosis present

## 2023-06-27 DIAGNOSIS — E860 Dehydration: Secondary | ICD-10-CM | POA: Diagnosis present

## 2023-06-27 DIAGNOSIS — N250 Renal osteodystrophy: Secondary | ICD-10-CM | POA: Diagnosis present

## 2023-06-27 DIAGNOSIS — Z951 Presence of aortocoronary bypass graft: Secondary | ICD-10-CM

## 2023-06-27 DIAGNOSIS — R278 Other lack of coordination: Secondary | ICD-10-CM | POA: Diagnosis present

## 2023-06-27 DIAGNOSIS — Z8679 Personal history of other diseases of the circulatory system: Secondary | ICD-10-CM

## 2023-06-27 DIAGNOSIS — E039 Hypothyroidism, unspecified: Secondary | ICD-10-CM | POA: Diagnosis present

## 2023-06-27 LAB — RESP PANEL BY RT-PCR (RSV, FLU A&B, COVID)  RVPGX2
Influenza A by PCR: NEGATIVE
Influenza B by PCR: NEGATIVE
Resp Syncytial Virus by PCR: NEGATIVE
SARS Coronavirus 2 by RT PCR: NEGATIVE

## 2023-06-27 LAB — COMPREHENSIVE METABOLIC PANEL
ALT: 7 U/L (ref 0–44)
AST: 11 U/L — ABNORMAL LOW (ref 15–41)
Albumin: 2.7 g/dL — ABNORMAL LOW (ref 3.5–5.0)
Alkaline Phosphatase: 91 U/L (ref 38–126)
Anion gap: 15 (ref 5–15)
BUN: 39 mg/dL — ABNORMAL HIGH (ref 8–23)
CO2: 25 mmol/L (ref 22–32)
Calcium: 8.8 mg/dL — ABNORMAL LOW (ref 8.9–10.3)
Chloride: 96 mmol/L — ABNORMAL LOW (ref 98–111)
Creatinine, Ser: 5.66 mg/dL — ABNORMAL HIGH (ref 0.61–1.24)
GFR, Estimated: 10 mL/min — ABNORMAL LOW (ref >60)
Glucose, Bld: 101 mg/dL — ABNORMAL HIGH (ref 70–99)
Potassium: 4.1 mmol/L (ref 3.5–5.1)
Sodium: 136 mmol/L (ref 135–145)
Total Bilirubin: 1.1 mg/dL (ref 0.0–1.2)
Total Protein: 6.3 g/dL — ABNORMAL LOW (ref 6.5–8.1)

## 2023-06-27 LAB — CBC
HCT: 36.3 % — ABNORMAL LOW (ref 39.0–52.0)
Hemoglobin: 10.7 g/dL — ABNORMAL LOW (ref 13.0–17.0)
MCH: 24.2 pg — ABNORMAL LOW (ref 26.0–34.0)
MCHC: 29.5 g/dL — ABNORMAL LOW (ref 30.0–36.0)
MCV: 82.1 fL (ref 80.0–100.0)
Platelets: 223 10*3/uL (ref 150–400)
RBC: 4.42 MIL/uL (ref 4.22–5.81)
RDW: 19.6 % — ABNORMAL HIGH (ref 11.5–15.5)
WBC: 7.9 10*3/uL (ref 4.0–10.5)
nRBC: 0 % (ref 0.0–0.2)

## 2023-06-27 LAB — CBG MONITORING, ED
Glucose-Capillary: 100 mg/dL — ABNORMAL HIGH (ref 70–99)
Glucose-Capillary: 112 mg/dL — ABNORMAL HIGH (ref 70–99)

## 2023-06-27 LAB — PROTIME-INR
INR: 1.1 (ref 0.8–1.2)
Prothrombin Time: 14.9 s (ref 11.4–15.2)

## 2023-06-27 LAB — I-STAT CHEM 8, ED
BUN: 38 mg/dL — ABNORMAL HIGH (ref 8–23)
Calcium, Ion: 1.04 mmol/L — ABNORMAL LOW (ref 1.15–1.40)
Chloride: 97 mmol/L — ABNORMAL LOW (ref 98–111)
Creatinine, Ser: 5.8 mg/dL — ABNORMAL HIGH (ref 0.61–1.24)
Glucose, Bld: 102 mg/dL — ABNORMAL HIGH (ref 70–99)
HCT: 35 % — ABNORMAL LOW (ref 39.0–52.0)
Hemoglobin: 11.9 g/dL — ABNORMAL LOW (ref 13.0–17.0)
Potassium: 4 mmol/L (ref 3.5–5.1)
Sodium: 136 mmol/L (ref 135–145)
TCO2: 28 mmol/L (ref 22–32)

## 2023-06-27 LAB — DIFFERENTIAL
Abs Immature Granulocytes: 0.04 10*3/uL (ref 0.00–0.07)
Basophils Absolute: 0.1 10*3/uL (ref 0.0–0.1)
Basophils Relative: 1 %
Eosinophils Absolute: 0.1 10*3/uL (ref 0.0–0.5)
Eosinophils Relative: 2 %
Immature Granulocytes: 1 %
Lymphocytes Relative: 11 %
Lymphs Abs: 0.9 10*3/uL (ref 0.7–4.0)
Monocytes Absolute: 0.7 10*3/uL (ref 0.1–1.0)
Monocytes Relative: 9 %
Neutro Abs: 6.1 10*3/uL (ref 1.7–7.7)
Neutrophils Relative %: 76 %

## 2023-06-27 LAB — PROCALCITONIN: Procalcitonin: 0.35 ng/mL

## 2023-06-27 LAB — TSH: TSH: 11.664 u[IU]/mL — ABNORMAL HIGH (ref 0.350–4.500)

## 2023-06-27 LAB — ETHANOL: Alcohol, Ethyl (B): <10 mg/dL (ref <10)

## 2023-06-27 LAB — APTT: aPTT: 39 s — ABNORMAL HIGH (ref 24–36)

## 2023-06-27 LAB — I-STAT CG4 LACTIC ACID, ED
Lactic Acid, Venous: 1.3 mmol/L (ref 0.5–1.9)
Lactic Acid, Venous: 1.1 mmol/L (ref 0.5–1.9)

## 2023-06-27 MED ORDER — ACETAMINOPHEN 325 MG PO TABS
650.0000 mg | ORAL_TABLET | ORAL | Status: DC | PRN
  Filled 2023-06-27: qty 2

## 2023-06-27 MED ORDER — HEPARIN SODIUM (PORCINE) 5000 UNIT/ML IJ SOLN
5000.0000 [IU] | Freq: Three times a day (TID) | INTRAMUSCULAR | Status: DC
  Administered 2023-06-27 – 2023-07-14 (×47): 5000 [IU] via SUBCUTANEOUS
  Filled 2023-06-27 (×46): qty 1

## 2023-06-27 MED ORDER — CLOPIDOGREL BISULFATE 75 MG PO TABS: 300.0000 mg | ORAL_TABLET | Freq: Once | ORAL | Status: DC

## 2023-06-27 MED ORDER — CEFTRIAXONE SODIUM 1 G IJ SOLR
1.0000 g | INTRAMUSCULAR | Status: DC
  Administered 2023-06-27 – 2023-06-29 (×3): 1 g via INTRAVENOUS
  Filled 2023-06-27 (×3): qty 10

## 2023-06-27 MED ORDER — SENNOSIDES-DOCUSATE SODIUM 8.6-50 MG PO TABS: 1.0000 | ORAL_TABLET | Freq: Every evening | ORAL | Status: DC | PRN

## 2023-06-27 MED ORDER — LORAZEPAM 2 MG/ML IJ SOLN
1.0000 mg | Freq: Once | INTRAMUSCULAR | Status: AC
  Administered 2023-06-27: 1 mg via INTRAVENOUS
  Filled 2023-06-27: qty 1

## 2023-06-27 MED ORDER — STROKE: EARLY STAGES OF RECOVERY BOOK
Freq: Once | Status: AC
  Filled 2023-06-27: qty 1

## 2023-06-27 MED ORDER — LEVOTHYROXINE SODIUM 75 MCG PO TABS: 37.5000 ug | ORAL_TABLET | Freq: Every day | ORAL | Status: DC

## 2023-06-27 MED ORDER — SODIUM CHLORIDE 0.9% FLUSH: 3.0000 mL | Freq: Once | INTRAVENOUS | Status: DC

## 2023-06-27 MED ORDER — SODIUM CHLORIDE 0.9 % IV SOLN
500.0000 mg | INTRAVENOUS | Status: DC
  Administered 2023-06-27: 500 mg via INTRAVENOUS
  Filled 2023-06-27: qty 5

## 2023-06-27 MED ORDER — IOHEXOL 350 MG/ML SOLN
100.0000 mL | Freq: Once | INTRAVENOUS | Status: AC | PRN
  Administered 2023-06-27: 100 mL via INTRAVENOUS

## 2023-06-27 MED ORDER — PANTOPRAZOLE SODIUM 40 MG PO TBEC
40.0000 mg | DELAYED_RELEASE_TABLET | Freq: Two times a day (BID) | ORAL | Status: DC
Start: 2023-06-27 — End: 2023-07-11
  Administered 2023-07-01 – 2023-07-11 (×10): 40 mg via ORAL
  Filled 2023-06-27 (×18): qty 1

## 2023-06-27 MED ORDER — CLOPIDOGREL BISULFATE 75 MG PO TABS
75.0000 mg | ORAL_TABLET | Freq: Every day | ORAL | Status: DC
  Administered 2023-07-02 – 2023-07-14 (×10): 75 mg via ORAL
  Filled 2023-06-27 (×12): qty 1

## 2023-06-27 MED ORDER — DOXYCYCLINE HYCLATE 100 MG IV SOLR
100.0000 mg | Freq: Two times a day (BID) | INTRAVENOUS | Status: DC
  Administered 2023-06-28 – 2023-06-30 (×4): 100 mg via INTRAVENOUS
  Filled 2023-06-27 (×5): qty 100

## 2023-06-27 MED ORDER — AMIODARONE HCL 200 MG PO TABS
200.0000 mg | ORAL_TABLET | Freq: Two times a day (BID) | ORAL | Status: DC
  Administered 2023-07-01 – 2023-07-14 (×15): 200 mg via ORAL
  Filled 2023-06-27 (×22): qty 1

## 2023-06-27 MED ORDER — ACETAMINOPHEN 650 MG RE SUPP: 650.0000 mg | RECTAL | Status: DC | PRN

## 2023-06-27 MED ORDER — ISOSORBIDE MONONITRATE ER 30 MG PO TB24: 30.0000 mg | ORAL_TABLET | Freq: Every day | ORAL | Status: DC

## 2023-06-27 MED ORDER — SERTRALINE HCL 100 MG PO TABS
100.0000 mg | ORAL_TABLET | Freq: Every day | ORAL | Status: DC
Start: 2023-06-28 — End: 2023-07-14
  Administered 2023-07-01 – 2023-07-14 (×10): 100 mg via ORAL
  Filled 2023-06-27 (×13): qty 1

## 2023-06-27 MED ORDER — CALCIUM ACETATE 667 MG PO TABS: 667.0000 mg | ORAL_TABLET | ORAL | Status: DC

## 2023-06-27 MED ORDER — CALCIUM ACETATE (PHOS BINDER) 667 MG PO CAPS
667.0000 mg | ORAL_CAPSULE | ORAL | Status: DC
  Administered 2023-07-07 – 2023-07-10 (×4): 667 mg via ORAL
  Filled 2023-06-27 (×4): qty 1

## 2023-06-27 MED ORDER — CALCIUM ACETATE (PHOS BINDER) 667 MG PO CAPS: 667.0000 mg | ORAL_CAPSULE | ORAL | Status: DC

## 2023-06-27 MED ORDER — METOPROLOL SUCCINATE ER 50 MG PO TB24
50.0000 mg | ORAL_TABLET | Freq: Every day | ORAL | Status: DC
Start: 2023-06-28 — End: 2023-07-09
  Administered 2023-07-01 – 2023-07-09 (×4): 50 mg via ORAL
  Filled 2023-06-27 (×8): qty 1

## 2023-06-27 MED ORDER — ACETAMINOPHEN 160 MG/5ML PO SOLN: 650.0000 mg | ORAL | Status: DC | PRN

## 2023-06-27 MED ORDER — CALCIUM ACETATE (PHOS BINDER) 667 MG PO CAPS
667.0000 mg | ORAL_CAPSULE | ORAL | Status: DC
  Administered 2023-07-01 – 2023-07-11 (×7): 667 mg via ORAL
  Filled 2023-06-27 (×10): qty 1

## 2023-06-27 MED ORDER — ASPIRIN 81 MG PO TBEC
81.0000 mg | DELAYED_RELEASE_TABLET | Freq: Every day | ORAL | Status: DC
  Administered 2023-07-03 – 2023-07-11 (×5): 81 mg via ORAL
  Filled 2023-06-27 (×8): qty 1

## 2023-06-27 NOTE — H&P (Addendum)
 History and Physical    Patient: [name] FMW:[id number] DOB: 05/25/1956 DOA: 06/27/2023 DOS: the patient was seen and examined on 06/27/2023 PCP: Alphonsa Glendia LABOR, MD  Patient coming from: SNF [facility]  Chief Complaint:  Chief Complaint  Patient presents with   Code Stroke   HPI: [name] is a 68 y.o. male with medical history significant of CAD s/p CABG, combined CHF, ESRD MWF, type 2 diabetes, hypertension, SVT, OSA, upper GI bleed who presented with slurred speech and AMS.  Ex-wife who is his caretaker provides history at bedside. Pt was sedated after receiving IV ativan  for MRI.  Pt moved into SNF about 9 months ago after a hospitalization. He was living along prior to that. He fracture his right hip last April and had declined and is now mostly bedbound and wheelchair bound. Caretaker last saw him well about 6 days ago on 1/9. He was diagnosed with right sided pneumonia on 1/7. Last dialysis session was Thursday 1/9. Normally schedule is MWF but he reportedly refused it yesterday which is unlike him per caretaker.   On arrival to the ED, he had temperature of 97.78F, bradycardic with heart rate of 58, elevated BP of 153/64.  He presented as a code stroke and was reported to be disoriented to month, not following commands, bilateral arm weakness, bilateral leg weakness, aphasia, dysarthria on exam.   CT head negative. 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  Severe stenosis in the proximal right super clinoid ICA and moderate stenosis in the left supraclinoid ICA.  60% stenosis in the proximal right ICA severe stenosis at the origin of the right vertebral artery with additional severe stenosis in the right V2 segment.  Chest x-ray demonstrated small right pleural effusion with patchy opacity.  There is also streaky left basilar opacity.  CBC without leukocytosis, anemia stable  with hemoglobin of 10.7.  BMP with creatinine of 5.66 with his previous ranging around 3-5  TSH elevated at 11.6 but was normal in September.  MRI pending at time of admission. He was started on IV Rocephin  and Azithromycin  for pneumonia.  Review of Systems: unable to review all systems due to the inability of the patient to answer questions. Past Medical History:  Diagnosis Date   Acute blood loss anemia 03/20/2021   Acute metabolic encephalopathy 03/03/2022   Acute on chronic respiratory failure with hypoxia (HCC) 02/28/2022   Anemia    Arthritis    hips shoulders   CAD (coronary artery disease) 03/03/2019   Cancer (HCC)    Esophageal cancer 2022   Complete rotator cuff tear 08/26/2020   Depression 05/31/2021   Esophagitis    ESRD on hemodialysis (HCC)    Fatigue 10/14/2020   gram neg rod Bacteremia 03/01/2022   Heart failure with mildly reduced ejection fraction (HFmrEF) (HCC)    Hip fracture (HCC) 09/09/2022   Hypertension    Myocardial infarction (HCC)    Sleep apnea    uses a bipap machine   Stroke (HCC) 12/14/2020   no last weakness or paralysis   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   Past Surgical History:  Procedure Laterality Date   AV FISTULA PLACEMENT Left 12/21/2021   Procedure: LEFT ARM ARTERIOVENOUS (AV) FISTULA CREATION;  Surgeon: Oris Krystal FALCON, MD;  Location: [facility];  Service: Vascular;  Laterality: Left;  BIOPSY  06/27/2019   Procedure: BIOPSY;  Surgeon: Golda Claudis PENNER, MD;  [facility]: [facility];  Service: Endoscopy;;  ascending colon polyp   BIOPSY  10/21/2020   Procedure: BIOPSY;  Surgeon: Golda Claudis PENNER, MD;  [facility]: [facility];  Service: Endoscopy;;  esophageal,gastric polyp   BIOPSY  02/24/2021   Procedure: BIOPSY;  Surgeon: Golda Claudis PENNER, MD;  [facility]: [facility];  Service: Endoscopy;;  distal and proximal esophageal biopsies    BIOPSY  05/30/2022   Procedure:  BIOPSY;  Surgeon: Cindie Carlin POUR, DO;  [facility]: [facility];  Service: Endoscopy;;   CARDIAC SURGERY     CATARACT EXTRACTION     COLONOSCOPY N/A 06/27/2019   [name]:Diverticulosis in the entire examined colon. tubular adenoma in ascending colon, 4mm tubular adenoma in prox sigmoid. external hemorrhoids   CORONARY ARTERY BYPASS GRAFT N/A 03/25/2019   Procedure: CORONARY ARTERY BYPASS GRAFTING (CABG) X  4 USING LEFT INTERNAL MAMMARY ARTERY AND RIGHT SAPHENOUS VEIN GRAFTS;  Surgeon: Shyrl Linnie KIDD, MD;  Location: [facility] OR;  Service: Open Heart Surgery;  Laterality: N/A;   ESOPHAGEAL BRUSHING  03/08/2022   Procedure: ESOPHAGEAL BRUSHING;  Surgeon: Cindie Carlin POUR, DO;  [facility];  Service: Endoscopy;;   ESOPHAGOGASTRODUODENOSCOPY (EGD) WITH PROPOFOL  N/A 10/21/2020   rehman:Normal hypopharynx.Normal proximal esophagus and mid esophagus.Esophageal mucosal changes consistent with long-segment Barrett's esophagus. (Focal low-grade dysplasia and atypia proximally) z line irregular 37 cm from incisors, 3 cm HH, single gastric polyp (fundic gland) normal duodenal bulb/second portion of duodenum, proximal margin of Barrett's at 34 cm   ESOPHAGOGASTRODUODENOSCOPY (EGD) WITH PROPOFOL  N/A 02/24/2021   Procedure: ESOPHAGOGASTRODUODENOSCOPY (EGD) WITH PROPOFOL ;  Surgeon: Golda Claudis PENNER, MD;  [facility];  Service: Endoscopy;  Laterality: N/A;  1:35   ESOPHAGOGASTRODUODENOSCOPY (EGD) WITH PROPOFOL  N/A 03/21/2021   Procedure: ESOPHAGOGASTRODUODENOSCOPY (EGD) WITH PROPOFOL ;  Surgeon: Rollin Dover, MD;  Location: [facility] ENDOSCOPY;  Service: Endoscopy;  Laterality: N/A;   ESOPHAGOGASTRODUODENOSCOPY (EGD) WITH PROPOFOL  N/A 10/23/2021   Procedure: ESOPHAGOGASTRODUODENOSCOPY (EGD) WITH PROPOFOL ;  Surgeon: Legrand Victory LITTIE DOUGLAS, MD;  Location: [facility] ENDOSCOPY;  Service: Gastroenterology;  Laterality: N/A;   ESOPHAGOGASTRODUODENOSCOPY (EGD) WITH PROPOFOL  N/A 03/08/2022   Procedure:  ESOPHAGOGASTRODUODENOSCOPY (EGD) WITH PROPOFOL ;  Surgeon: Cindie Carlin POUR, DO;  [facility];  Service: Endoscopy;  Laterality: N/A;   ESOPHAGOGASTRODUODENOSCOPY (EGD) WITH PROPOFOL  N/A 05/27/2022   Procedure: ESOPHAGOGASTRODUODENOSCOPY (EGD) WITH PROPOFOL ;  Surgeon: Eartha Angelia Sieving, MD;  [facility];  Service: Gastroenterology;  Laterality: N/A;   ESOPHAGOGASTRODUODENOSCOPY (EGD) WITH PROPOFOL  N/A 05/30/2022   Procedure: ESOPHAGOGASTRODUODENOSCOPY (EGD) WITH PROPOFOL ;  Surgeon: Cindie Carlin POUR, DO;  [facility];  Service: Endoscopy;  Laterality: N/A;   EXCISION MORTON'S NEUROMA     EYE SURGERY Left    retina   INCISION AND DRAINAGE OF WOUND Right 06/28/2021   Procedure: IRRIGATION AND DEBRIDEMENT WOUND;  Surgeon: Elisabeth Craig RAMAN, MD;  Location: [facility] OR;  Service: Plastics;  Laterality: Right;  1.5 hour   INSERTION OF DIALYSIS CATHETER Right 12/21/2021   Procedure: INSERTION OF TUNNELED DIALYSIS CATHETER;  Surgeon: Oris Krystal FALCON, MD;  Location: [facility];  Service: Vascular;  Laterality: Right;   INTRAMEDULLARY (IM) NAIL INTERTROCHANTERIC Right 09/10/2022   Procedure: INTRAMEDULLARY (IM) NAIL INTERTROCHANTERIC;  Surgeon: Edna Sieving LABOR, MD;  Location: [facility] OR;  Service: Orthopedics;  Laterality: Right;   LEFT HEART CATH AND CORS/GRAFTS ANGIOGRAPHY N/A 02/01/2022   Procedure: LEFT HEART CATH AND CORS/GRAFTS ANGIOGRAPHY;  Surgeon: Jordan, Peter M, MD;  Location: [facility] INVASIVE CV LAB;  Service: Cardiovascular;  Laterality: N/A;   POLYPECTOMY  06/27/2019   Procedure: POLYPECTOMY;  Surgeon: Golda Claudis PENNER, MD;  [facility];  Service: Endoscopy;;  proximal sigmoid colon   RIGHT/LEFT HEART CATH AND CORONARY ANGIOGRAPHY N/A 03/12/2019   Procedure: RIGHT/LEFT HEART CATH AND CORONARY ANGIOGRAPHY;  Surgeon: Dann Candyce RAMAN, MD;  Location: [facility] INVASIVE CV LAB;  Service: Cardiovascular;  Laterality: N/A;   SHOULDER ARTHROSCOPY WITH ROTATOR CUFF  REPAIR AND SUBACROMIAL DECOMPRESSION Right 08/26/2020   Procedure: RIGHT SHOULDER MINI OPEN ROTATOR CUFF REPAIR AND SUBACROMIAL DECOMPRESSION WITH PATCH GRAFT;  Surgeon: Duwayne Purchase, MD;  Location: [facility];  Service: Orthopedics;  Laterality: Right;  90 MINS GENERAL WITH BLOCK   SKIN SPLIT GRAFT Right 06/28/2021   Procedure: SKIN GRAFT SPLIT THICKNESS;  Surgeon: Elisabeth Craig RAMAN, MD;  Location: [facility];  Service: Plastics;  Laterality: Right;   TEE WITHOUT CARDIOVERSION N/A 03/25/2019   Procedure: TRANSESOPHAGEAL ECHOCARDIOGRAM (TEE);  Surgeon: Shyrl Linnie KIDD, MD;  Location: [facility] OR;  Service: Open Heart Surgery;  Laterality: N/A;   Social History:  reports that he quit smoking about 34 years ago. His smoking use included cigarettes. He has been exposed to tobacco smoke. He has never used smokeless tobacco. He reports that he does not currently use alcohol after a past usage of about 2.0 standard drinks of alcohol per week. He reports that he does not use drugs.  Allergies  Allergen Reactions   Bee Venom Anaphylaxis   Atorvastatin  Other (See Comments)    myalgia   Diltiazem  Itching   Lopressor  [Metoprolol ]     NIGHTMARES. Pt tolerating this now 11/25/22   Oxycodone  Other (See Comments)    Reaction not listed on MAR    Reglan  [Metoclopramide ] Other (See Comments)    Suicidal    Rosuvastatin  Other (See Comments)    myalgias   Valsartan  Itching   Zyrtec [Cetirizine] Other (See Comments)    intolerance    Family History  Problem Relation Age of Onset   Colon cancer Mother    Heart Problems Father    Diabetes Father    Valvular heart disease Father    Sleep apnea Neg Hx    Stroke Neg Hx     Prior to Admission medications   Medication Sig Start Date End Date Taking? Authorizing Provider  amiodarone  (PACERONE ) 200 MG tablet Take 1 tablet (200 mg total) by mouth 2 (two) times daily. 02/16/23   Ghimire, Donalda HERO, MD  calcium  acetate (PHOSLO ) 667 MG capsule Take 667 mg by mouth See  admin instructions. Take 667 mg by mouth twice daily on Monday, Wednesday, and Friday    [provider]  Calcium  Acetate 667 MG TABS Take 667 mg by mouth See admin instructions. Take 667 mg by mouth three times daily on Tuesday, Thursday, Saturday, and Sunday    [provider]  EPINEPHrine  0.3 mg/0.3 mL IJ SOAJ injection Inject 0.3 mg into the muscle as needed for anaphylaxis. 01/25/21   Alphonsa Glendia LABOR, MD  Evolocumab  (REPATHA  SURECLICK) 140 MG/ML SOAJ Inject 140 mg into the skin every 14 (fourteen) days. 06/29/22   Vannie Reche RAMAN, NP  fentaNYL  (DURAGESIC ) 25 MCG/HR Place 1 patch onto the skin every 3 (three) days. 02/16/23   Ghimire, Donalda HERO, MD  gabapentin (NEURONTIN) 100 MG capsule Take 100 mg by mouth at bedtime.    [provider]  hydrALAZINE  (APRESOLINE ) 25 MG tablet  Take 1 tablet (25 mg total) by mouth every 8 (eight) hours. 02/16/23   Ghimire, Donalda HERO, MD  insulin  lispro (HUMALOG KWIKPEN) 100 UNIT/ML KwikPen Inject 0-15 Units into the skin 3 (three) times daily. BS 0-120 inject 0 units BS 121-150 inject 2 units  BS 151-200 inject 3 units  BS 201-250 inject 5 units  BS 251-300 inject 8 units  BS 301-350 inject 11 units  BS 351-400 inject 15 units BS 401-999 inject 17 units and call provider    [provider]  isosorbide  mononitrate (IMDUR ) 30 MG 24 hr tablet Take 1 tablet (30 mg total) by mouth daily. 02/16/23   Ghimire, Donalda HERO, MD  levothyroxine  (SYNTHROID ) 25 MCG tablet Take 25 mcg by mouth daily.    [provider]  metoprolol  succinate (TOPROL -XL) 50 MG 24 hr tablet Take 1 tablet (50 mg total) by mouth daily. Take with or immediately following a meal. 02/16/23   Ghimire, Donalda HERO, MD  ondansetron  (ZOFRAN ) 4 MG/2ML SOLN injection Inject 4 mg into the vein daily. PRN order: inject 4 mg every 8 hours as needed for nausea and vomiting    [provider]  OXYGEN Inhale 2 L into the lungs continuous.    [provider]   pantoprazole  (PROTONIX ) 40 MG tablet Take 1 tablet (40 mg total) by mouth 2 (two) times daily. 05/30/22   Evonnie Lenis, MD  polyethylene glycol (MIRALAX ) 17 g packet Take 17 g by mouth daily. 11/29/22   Tharon Lung, MD  sertraline  (ZOLOFT ) 100 MG tablet Take 1 tablet (100 mg total) by mouth daily. 11/29/22   Tharon Lung, MD  UNABLE TO FIND Take 1 Dose by mouth in the morning and at bedtime. Med Name: Med plus 2.0    [provider]    Physical Exam: Vitals:   06/27/23 1815 06/27/23 1830 06/27/23 1845 06/27/23 1950  BP: (!) 155/73 (!) 149/61 (!) 147/67 138/70  Pulse: 62 61 61 61  Resp: 13 (!) 9 10 11   Temp:    98.4 F (36.9 C)  TempSrc:      SpO2: 100% 100% 100% 94%  Weight:      Height:       Constitutional: NAD, calm, comfortable, obtunded chronically ill-appearing male lying on his right side in bed.  Unresponsive to initial noxious stimuli but later when turned over for exam of his back patient moans and groans in pain. Eyes: PERRL, lids and conjunctivae normal ENMT: Mucous membranes are moist.  Neck: normal, supple Respiratory: clear to auscultation bilaterally, no wheezing, no crackles. Normal respiratory effort. No accessory muscle use. On 2L via .  Cardiovascular: Regular rate and rhythm, no murmurs / rubs / gallops. No extremity edema.   Abdomen: Soft, no grimace with palpation  musculoskeletal: no clubbing / cyanosis. No joint deformity upper and lower extremities.  Muscle wasting in all extremities. Skin: Superficial decubitus ulcer to the coccyx region. Neurologic: Patient is obtunded following administration of IV Ativan  but was able to groan when turned onto his side. Psychiatric: Unable to assess Data Reviewed:  See HPI  Assessment and Plan: * Stroke [facility]) Pt presented with aphasia, generalized weakness from SNF -CTA head and neck demonstrated numerous areas of stenosis including 60% stenosis in the proximal right ICA - MRI brain positive for stroke  under neurology read. Final radiology read pending.  - obtain echocardiogram  - Neurology has recommended starting aspirin  and Plavix . However unable to take oral due to sedation from IV ativan . Will  re-attempt later this evening.  -Obtain A1c and lipids -PT/OT/SLT -Frequent neuro checks and keep on telemetry -Allow for permissive hypertension with blood pressure treatment as needed only if systolic goes above 220- although somewhat unclear what his last known well time was  Chronic combined systolic and diastolic CHF (congestive heart failure) (HCC) -stable.    HTN (hypertension) -allow for permissive HTN overnight but likely can resume antihypertensives tomorrow. Somewhat unclear when his last known well time is.   Pleural effusion -Bilateral pleural effusion versus infiltrate noted on chest x-ray.  He was recently diagnosed with right sided pneumonia on 1/7 and was reportedly treated with a course of antibiotics.  However given his missed dialysis session and lack of fever and leukocytosis, doubt this is reoccurrence of his pneumonia.  He was empirically given a dose of IV Rocephin  and azithromycin  in the ED.  Will check procalcitonin before continuing any further antibiotics  Type 2 diabetes mellitus (HCC) - Uncontrolled.  Hemoglobin A1c of 8.1 in March. -Hold on SSI while NPO  Obstructive sleep apnea -not on CPAP but has nighttime O2 supplement  History of supraventricular tachycardia - Continue amiodarone  and beta-blocker  Abnormal TSH -TSH elevated to 11 but was just normal in September. Pt has been on his levothyroxine . Suspect more due to sick euthyroid.   ESRD (end stage renal disease) on dialysis [facility]) -scheduled MWF. Last session on Thursday.  -CXR and CTA showed bilateral pleural effusion vs infiltrate. More likely fluid overload from missed session Monday (yesterday) -need nephrology consult in the morning  S/P CABG (coronary artery bypass graft) -continue  aspirin       Advance Care Planning:   Code Status: Limited: Do not attempt resuscitation (DNR) -DNR-LIMITED -Do Not Intubate/DNI    Consults: neurology  Family Communication: ex-wife (caretaker) at bedside  Severity of Illness: The appropriate patient status for this patient is OBSERVATION. Observation status is judged to be reasonable and necessary in order to provide the required intensity of service to ensure the patient's safety. The patient's presenting symptoms, physical exam findings, and initial radiographic and laboratory data in the context of their medical condition is felt to place them at decreased risk for further clinical deterioration. Furthermore, it is anticipated that the patient will be medically stable for discharge from the hospital within 2 midnights of admission.   Author: Alfrieda ONEIDA Pillar, DO 06/27/2023 8:13 PM  For on call review [email or web address].

## 2023-06-27 NOTE — Assessment & Plan Note (Signed)
-  allow for permissive HTN overnight but likely can resume antihypertensives tomorrow. Somewhat unclear when his last known well time is.

## 2023-06-27 NOTE — ED Provider Notes (Signed)
 Patient arrives for strokelike symptoms.  He arrives from [facility] nursing facility.  Last known well was 1230.  He is reportedly alert and oriented at baseline.  Staff stated that he does typically have a right sided lean due to truncal weakness at baseline.  This afternoon, staff noticed confusion, slurred speech and global weakness.  Airway intact upon arrival.  Patient arrives as a code stroke.  He was taken directly to CT scanner.   Melvenia Motto, MD 06/27/23 [phone]

## 2023-06-27 NOTE — Assessment & Plan Note (Signed)
-  Bilateral pleural effusion versus infiltrate noted on chest x-ray.  He was recently diagnosed with right sided pneumonia on 1/7 and was reportedly treated with a course of antibiotics.  However given his missed dialysis session and lack of fever and leukocytosis, doubt this is reoccurrence of his pneumonia.  He was empirically given a dose of IV Rocephin  and azithromycin  in the ED.  Will check procalcitonin before continuing any further antibiotics

## 2023-06-27 NOTE — Assessment & Plan Note (Signed)
 stable

## 2023-06-27 NOTE — Assessment & Plan Note (Signed)
-  not on CPAP but has nighttime O2 supplement

## 2023-06-27 NOTE — ED Notes (Signed)
 Went to MRI

## 2023-06-27 NOTE — Assessment & Plan Note (Signed)
-  TSH elevated to 11 but was just normal in September. Pt has been on his levothyroxine. Suspect more due to sick euthyroid.

## 2023-06-27 NOTE — Assessment & Plan Note (Signed)
-  scheduled MWF. Last session on Thursday.  -CXR and CTA showed bilateral pleural effusion vs infiltrate. More likely fluid overload from missed session Monday (yesterday) -need nephrology consult in the morning

## 2023-06-27 NOTE — Assessment & Plan Note (Signed)
 Continue amiodarone and beta blocker.

## 2023-06-27 NOTE — Assessment & Plan Note (Addendum)
-   Uncontrolled.  Hemoglobin A1c of 8.1 in March. -Hold on SSI while NPO

## 2023-06-27 NOTE — Code Documentation (Signed)
 Stroke Response Nurse Documentation Code Documentation  [name] is a 68 y.o. male arriving to [facility]  via [facility] on 06/27/2023 with past medical hx of CAD, cancer, ESRD on hemodialysis, heart failure, MI, diabetes, sleep apnea. On No antithrombotic. Code stroke was activated by EMS.   Patient from facility where he was LKW at 1100 and now complaining of altered mental status and slurred speech. Nursing staff came in to check patient's CBG at 1100, when they returned at 1230 he was noted to be confused, slurring his words, and have generalized weakness.   Caregiver reports: Pt had pneumonia one week ago. Pt fell approximately two months ago that resulted in right hip fracture. Last hemodialysis treatment was on Thursday (06/22/2023). He usually receives hemodialysis on M/W/F, however the recent snow interfered with his normal treatment schedule.   Stroke team at the bedside on patient arrival. Labs drawn and patient cleared for CT by Dr. Melvenia. Patient to CT with team. NIHSS 17, see documentation for details and code stroke times. Patient disoriented to month, not following commands, bilateral arm weakness, bilateral leg weakness, aphasia, dysarthria on exam.   The following imaging was completed:  CT Head, CTA, and CTP.   Patient is not a candidate for IV Thrombolytic. Patient is not a candidate for IR due to imaging negative for LVO.   Care Plan: q2h NIHSS and VS for LKW 1100.   Bedside handoff with ED RN Mardeen.    [name] First  Rapid Response RN

## 2023-06-27 NOTE — Consult Note (Signed)
 NEUROLOGY CONSULT NOTE   Date of service: June 27, 2023 Patient Name: [name] MRN:  [id number] DOB:  07-20-1955 Chief Complaint: Confusion Requesting Provider: Dasie Faden, MD  History of Present Illness  [name] is a 68 y.o. male with a past medical history of ESRD, DM, previous stroke, CAD, heart failure    he states that he was feeling bad since yesterday, but the nursing home staff did not notice anything particularly off until therapy went to work with him around 1230.  He had blood glucose check around 11, and was relatively normal at that time but the nurse did not speak to him more than telling him he was going to have his sugar checked and he reached out his finger to presented.   LKW: Unclear, likely yesterday IV Thrombolysis: No, outside window EVT: No, no LVO  NIHSS components Score: Comment  1a Level of Conscious 0[x]  1[]  2[]  3[]      1b LOC Questions 0[]  1[x]  2[]       1c LOC Commands 0[]  1[x]  2[]       2 Best Gaze 0[]  1[]  2[]       3 Visual 0[]  1[]  2[]  3[]      4 Facial Palsy 0[x]  1[]  2[]  3[]      5a Motor Arm - left 0[]  1[]  2[]  3[]  4[]  UN[]    5b Motor Arm - Right 0[]  1[]  2[]  3[]  4[]  UN[]    6a Motor Leg - Left 0[]  1[]  2[]  3[]  4[]  UN[]    6b Motor Leg - Right 0[]  1[]  2[]  3[]  4[]  UN[]    7 Limb Ataxia 0[]  1[]  2[]  3[]  UN[]     8 Sensory 0[]  1[]  2[]  UN[]      9 Best Language 0[]  1[]  2[]  3[]      10 Dysarthria 0[]  1[]  2[]  UN[]      11 Extinct. and Inattention 0[]  1[]  2[]       TOTAL:       Past History   Past Medical History:  Diagnosis Date   Acute blood loss anemia 03/20/2021   Acute metabolic encephalopathy 03/03/2022   Acute on chronic respiratory failure with hypoxia (HCC) 02/28/2022   Anemia    Arthritis    hips shoulders   CAD (coronary artery disease) 03/03/2019   Cancer (HCC)    Esophageal cancer 2022   Complete rotator cuff tear 08/26/2020   Depression 05/31/2021   Esophagitis    ESRD on hemodialysis (HCC)    Fatigue 10/14/2020    gram neg rod Bacteremia 03/01/2022   Heart failure with mildly reduced ejection fraction (HFmrEF) (HCC)    Hip fracture (HCC) 09/09/2022   Hypertension    Myocardial infarction (HCC)    Sleep apnea    uses a bipap machine   Stroke (HCC) 12/14/2020   no last weakness or paralysis   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    Past Surgical History:  Procedure Laterality Date   AV FISTULA PLACEMENT Left 12/21/2021   Procedure: LEFT ARM ARTERIOVENOUS (AV) FISTULA CREATION;  Surgeon: Oris Krystal FALCON, MD;  Location: [facility];  Service: Vascular;  Laterality: Left;   BIOPSY  06/27/2019   Procedure: BIOPSY;  Surgeon: Golda Claudis PENNER, MD;  Location: [facility];  Service: Endoscopy;;  ascending colon polyp   BIOPSY  10/21/2020   Procedure: BIOPSY;  Surgeon: Golda Claudis PENNER, MD;  Location: [facility];  Service: Endoscopy;;  esophageal,gastric polyp   BIOPSY  02/24/2021  Procedure: BIOPSY;  Surgeon: Golda Claudis PENNER, MD;  Location: [facility];  Service: Endoscopy;;  distal and proximal esophageal biopsies    BIOPSY  05/30/2022   Procedure: BIOPSY;  Surgeon: Cindie Carlin POUR, DO;  Location: [facility];  Service: Endoscopy;;   CARDIAC SURGERY     CATARACT EXTRACTION     COLONOSCOPY N/A 06/27/2019   [name]:Diverticulosis in the entire examined colon. tubular adenoma in ascending colon, 4mm tubular adenoma in prox sigmoid. 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 (Focal low-grade dysplasia and atypia proximally) z line irregular 37 cm from incisors, 3 cm HH, single gastric polyp (fundic gland) normal duodenal bulb/second portion of duodenum, proximal margin of [name]'s at 34 cm   ESOPHAGOGASTRODUODENOSCOPY (EGD) WITH PROPOFOL  N/A 02/24/2021   Procedure: ESOPHAGOGASTRODUODENOSCOPY (EGD) WITH PROPOFOL ;  Surgeon: Golda Claudis PENNER, MD;  [facility];  Service: Endoscopy;  Laterality: N/A;  1:35   ESOPHAGOGASTRODUODENOSCOPY (EGD) WITH PROPOFOL  N/A 03/21/2021   Procedure: ESOPHAGOGASTRODUODENOSCOPY (EGD) WITH PROPOFOL ;  Surgeon: Rollin Dover, MD;  Location: [facility] ENDOSCOPY;  Service: Endoscopy;  Laterality: N/A;   ESOPHAGOGASTRODUODENOSCOPY (EGD) WITH PROPOFOL  N/A 10/23/2021   Procedure: ESOPHAGOGASTRODUODENOSCOPY (EGD) WITH PROPOFOL ;  Surgeon: Legrand Victory LITTIE DOUGLAS, MD;  Location: [facility] ENDOSCOPY;  Service: Gastroenterology;  Laterality: N/A;   ESOPHAGOGASTRODUODENOSCOPY (EGD) WITH PROPOFOL  N/A 03/08/2022   Procedure: ESOPHAGOGASTRODUODENOSCOPY (EGD) WITH PROPOFOL ;  Surgeon: Cindie Carlin POUR, DO;  [facility];  Service: Endoscopy;  Laterality: N/A;   ESOPHAGOGASTRODUODENOSCOPY (EGD) WITH PROPOFOL  N/A 05/27/2022   Procedure: ESOPHAGOGASTRODUODENOSCOPY (EGD) WITH PROPOFOL ;  Surgeon: Eartha Angelia Sieving, MD;  [facility];  Service: Gastroenterology;  Laterality: N/A;   ESOPHAGOGASTRODUODENOSCOPY (EGD) WITH PROPOFOL  N/A 05/30/2022   Procedure: ESOPHAGOGASTRODUODENOSCOPY (EGD) WITH PROPOFOL ;  Surgeon: Cindie Carlin POUR, DO;  [facility];  Service: Endoscopy;  Laterality: N/A;   EXCISION MORTON'S NEUROMA     EYE SURGERY Left    retina   INCISION AND DRAINAGE OF WOUND Right 06/28/2021   Procedure: IRRIGATION AND DEBRIDEMENT WOUND;  Surgeon: Elisabeth Craig RAMAN, MD;  Location: [facility];   Service: Plastics;  Laterality: Right;  1.5 hour   INSERTION OF DIALYSIS CATHETER Right 12/21/2021   Procedure: INSERTION OF TUNNELED DIALYSIS CATHETER;  Surgeon: Oris Krystal FALCON, MD;  Location: [facility];  Service: Vascular;  Laterality: Right;   INTRAMEDULLARY (IM) NAIL INTERTROCHANTERIC Right 09/10/2022   Procedure: INTRAMEDULLARY (IM) NAIL INTERTROCHANTERIC;  Surgeon: Edna Sieving LABOR, MD;  Location: [facility];  Service: Orthopedics;  Laterality: Right;   LEFT HEART CATH AND CORS/GRAFTS ANGIOGRAPHY N/A 02/01/2022   Procedure: LEFT HEART CATH AND CORS/GRAFTS ANGIOGRAPHY;  Surgeon: Jordan, Peter M, MD;  Location: [facility] INVASIVE CV LAB;  Service: Cardiovascular;  Laterality: N/A;   POLYPECTOMY  06/27/2019   Procedure: POLYPECTOMY;  Surgeon: Golda Claudis PENNER, MD;  Location: [facility];  Service: Endoscopy;;  proximal sigmoid colon   RIGHT/LEFT HEART CATH AND CORONARY ANGIOGRAPHY N/A 03/12/2019   Procedure: RIGHT/LEFT  HEART CATH AND CORONARY ANGIOGRAPHY;  Surgeon: Dann Candyce RAMAN, MD;  Location: [facility] INVASIVE CV LAB;  Service: Cardiovascular;  Laterality: N/A;   SHOULDER ARTHROSCOPY WITH ROTATOR CUFF REPAIR AND SUBACROMIAL DECOMPRESSION Right 08/26/2020   Procedure: RIGHT SHOULDER MINI OPEN ROTATOR CUFF REPAIR AND SUBACROMIAL DECOMPRESSION WITH PATCH GRAFT;  Surgeon: Duwayne Purchase, MD;  Location: [facility];  Service: Orthopedics;  Laterality: Right;  90 MINS GENERAL WITH BLOCK   SKIN SPLIT GRAFT Right 06/28/2021   Procedure: SKIN GRAFT SPLIT THICKNESS;  Surgeon: Elisabeth Craig RAMAN, MD;  Location: [facility];  Service: Plastics;  Laterality: Right;   TEE WITHOUT CARDIOVERSION N/A 03/25/2019   Procedure: TRANSESOPHAGEAL ECHOCARDIOGRAM (TEE);  Surgeon: Shyrl Linnie KIDD, MD;  Location: [facility] OR;  Service: Open Heart Surgery;  Laterality: N/A;    Family History: Family History  Problem Relation Age of Onset   Colon cancer Mother    Heart Problems Father    Diabetes Father    Valvular heart disease Father     Sleep apnea Neg Hx    Stroke Neg Hx     Social History  reports that he quit smoking about 34 years ago. His smoking use included cigarettes. He has been exposed to tobacco smoke. He has never used smokeless tobacco. He reports that he does not currently use alcohol after a past usage of about 2.0 standard drinks of alcohol per week. He reports that he does not use drugs.  Allergies  Allergen Reactions   Bee Venom Anaphylaxis   Atorvastatin  Other (See Comments)    myalgia   Diltiazem  Itching   Lopressor  [Metoprolol ]     NIGHTMARES. Pt tolerating this now 11/25/22   Oxycodone  Other (See Comments)    Reaction not listed on MAR    Reglan  [Metoclopramide ] Other (See Comments)    Suicidal    Rosuvastatin  Other (See Comments)    myalgias   Valsartan  Itching   Zyrtec [Cetirizine] Other (See Comments)    intolerance    Medications   Current Facility-Administered Medications:    azithromycin  (ZITHROMAX ) 500 mg in sodium chloride  0.9 % 250 mL IVPB, 500 mg, Intravenous, Q24H, Dasie Faden, MD, Last Rate: 250 mL/hr at 06/27/23 1856, 500 mg at 06/27/23 1856   cefTRIAXone  (ROCEPHIN ) 1 g in sodium chloride  0.9 % 100 mL IVPB, 1 g, Intravenous, Q24H, Dasie Faden, MD, Stopped at 06/27/23 1850   sodium chloride  flush (NS) 0.9 % injection 3 mL, 3 mL, Intravenous, Once, Dasie Faden, MD  Current Outpatient Medications:    amiodarone  (PACERONE ) 200 MG tablet, Take 1 tablet (200 mg total) by mouth 2 (two) times daily., Disp: , Rfl:    calcium  acetate (PHOSLO ) 667 MG capsule, Take 667 mg by mouth See admin instructions. Take 667 mg by mouth twice daily on Monday, Wednesday, and Friday, Disp: , Rfl:    Calcium  Acetate 667 MG TABS, Take 667 mg by mouth See admin instructions. Take 667 mg by mouth three times daily on Tuesday, Thursday, Saturday, and Sunday, Disp: , Rfl:    Evolocumab  (REPATHA  SURECLICK) 140 MG/ML SOAJ, Inject 140 mg into the skin every 14 (fourteen) days., Disp: 6 mL, Rfl: 1    FIASP  FLEXTOUCH 100 UNIT/ML FlexTouch Pen, Inject 0-15 Units into the skin See admin instructions. Per sliding scale: 0-120=0 notify NP/MD if BS is less than 70; 121-150= 2 units 151-200=3 units 201-250=5 units 251-300=8 units 301-350=11 units 351-400= 15 units and notify NP/MD for BS greater than 400, Disp: , Rfl:    gabapentin (NEURONTIN)  100 MG capsule, Take 100 mg by mouth at bedtime., Disp: , Rfl:    hydrALAZINE  (APRESOLINE ) 25 MG tablet, Take 1 tablet (25 mg total) by mouth every 8 (eight) hours. (Patient taking differently: Take 25 mg by mouth 3 (three) times daily.), Disp: , Rfl:    ipratropium-albuterol  (DUONEB) 0.5-2.5 (3) MG/3ML SOLN, Inhale 3 mLs into the lungs in the morning and at bedtime., Disp: , Rfl:    isosorbide  mononitrate (IMDUR ) 30 MG 24 hr tablet, Take 1 tablet (30 mg total) by mouth daily., Disp: , Rfl:    levothyroxine  (SYNTHROID ) 75 MCG tablet, Take 37.5 mcg by mouth daily., Disp: , Rfl:    loratadine (CLARITIN) 10 MG tablet, Take 10 mg by mouth See admin instructions. Every 2 days, Disp: , Rfl:    melatonin 3 MG TABS tablet, Take 3 mg by mouth at bedtime., Disp: , Rfl:    metoprolol  succinate (TOPROL -XL) 50 MG 24 hr tablet, Take 1 tablet (50 mg total) by mouth daily. Take with or immediately following a meal., Disp: , Rfl:    Nutritional Supplements (ENSURE ORIGINAL) LIQD, Take 1 Dose by mouth 3 (three) times daily with meals., Disp: , Rfl:    pantoprazole  (PROTONIX ) 40 MG tablet, Take 1 tablet (40 mg total) by mouth 2 (two) times daily., Disp: 60 tablet, Rfl: 1   polyethylene glycol (MIRALAX ) 17 g packet, Take 17 g by mouth daily., Disp: 14 each, Rfl: 0   sertraline  (ZOLOFT ) 100 MG tablet, Take 1 tablet (100 mg total) by mouth daily., Disp: 30 tablet, Rfl: 0   cefTRIAXone  (ROCEPHIN ) 2 g injection, Inject 2 g into the muscle daily. For 5 days, Disp: , Rfl:    EPINEPHrine  0.3 mg/0.3 mL IJ SOAJ injection, Inject 0.3 mg into the muscle as needed for anaphylaxis. (Patient not  taking: Reported on 06/27/2023), Disp: 1 each, Rfl: 4   fentaNYL  (DURAGESIC ) 25 MCG/HR, Place 1 patch onto the skin every 3 (three) days., Disp: 3 patch, Rfl: 0   insulin  lispro (HUMALOG KWIKPEN) 100 UNIT/ML KwikPen, Inject 0-15 Units into the skin 3 (three) times daily. BS 0-120 inject 0 units BS 121-150 inject 2 units  BS 151-200 inject 3 units  BS 201-250 inject 5 units  BS 251-300 inject 8 units  BS 301-350 inject 11 units  BS 351-400 inject 15 units BS 401-999 inject 17 units and call provider, Disp: , Rfl:    ondansetron  (ZOFRAN ) 4 MG/2ML SOLN injection, Inject 4 mg into the vein daily. PRN order: inject 4 mg every 8 hours as needed for nausea and vomiting, Disp: , Rfl:    OXYGEN, Inhale 2 L into the lungs continuous., Disp: , Rfl:    UNABLE TO FIND, Take 1 Dose by mouth in the morning and at bedtime. Med Name: Med plus 2.0, Disp: , Rfl:   Vitals   Vitals:   06/27/23 1700 06/27/23 1815 06/27/23 1830 06/27/23 1845  BP: (!) 150/65 (!) 155/73 (!) 149/61 (!) 147/67  Pulse: 60 62 61 61  Resp: (!) 9 13 (!) 9 10  Temp:      TempSrc:      SpO2: 95% 100% 100% 100%  Weight:      Height:        Body mass index is 25.83 kg/m.  Physical Exam    Neurologic Examination    Neuro: Mental Status: Patient is awake, alert, speech is dysarthric and often unintelligible.  He is able to tell me his age is 42, but I  am unable to understand what his answer is to the month. Cranial Nerves: II: Visual Fields are full. Pupils are equal, round, and reactive to light.   III,IV, VI: EOMI without ptosis or diploplia.  V: Facial sensation is symmetric to temperature VII: Facial movement is symmetric.  VIII: hearing is intact to voice X: Uvula elevates symmetrically XII: tongue is midline without atrophy or fasciculations.  Motor: He has significant weakness in the left arm and leg, holds the right leg contracted, is able to resist gravity some in the right arm but it does fall to the bed before 10  seconds Sensory: Sensation is symmetric to light touch in the arms and legs. Cerebellar: Unable to perform        Labs/Imaging/Neurodiagnostic studies    ASSESSMENT   [name] is a 68 y.o. male who presents with acute encephalopathy from the nursing home.  His last known well is slightly unclear and therefore I would not favor IV tenecteplase, and he has no LVO so he is not a candidate for thrombolytic therapy.  His MRI does show multiple punctate embolic appearing infarcts and he will need to be admitted for workup and evaluation of this.  He has missed a couple of his dialysis sessions and I suspect that this may be playing more of a role in his encephalopathy than strokes, though given the number is difficult to be certain of this.  RECOMMENDATIONS  - HgbA1c, fasting lipid panel - MRI of the brain without contrast - Frequent neuro checks - Echocardiogram - Prophylactic therapy-Antiplatelet med: Aspirin  - dose 81mg  and plavix  75mg  daily  after 300mg  load  - Risk factor modification - Telemetry monitoring - PT consult, OT consult, Speech consult - Stroke team to follow  ______________________________________________________________________    Signed, Aisha Seals, MD [facility]

## 2023-06-27 NOTE — ED Provider Notes (Addendum)
 [place] EMERGENCY DEPARTMENT AT [facility] Provider Note   CSN: [id number] Arrival date & time: 06/27/23  1500     History  Chief Complaint  Patient presents with   Code Stroke    [name] is a 68 y.o. male.  67 year old male presents with strokelike symptoms.  Patient has a history of end-stage renal disease and last time he had dialysis is unknown.  Review of her record shows that he was diagnosed with pneumonia approximate week ago.  Arrives from nursing facility with last known normal few hours ago.  Reportedly he is alert and oriented at baseline.  Today has been altered.  Patient is bedbound at this time.  Normally does have a right-sided lean.  Patient himself denies any headache or emesis.  No chest or abdominal discomfort.  Unsure if he is had no weakness.  History is difficult due to his current state       Home Medications Prior to Admission medications   Medication Sig Start Date End Date Taking? Authorizing Provider  amiodarone  (PACERONE ) 200 MG tablet Take 1 tablet (200 mg total) by mouth 2 (two) times daily. 02/16/23   Ghimire, Donalda CHRISTELLA, MD  calcium  acetate (PHOSLO ) 667 MG capsule Take 667 mg by mouth See admin instructions. Take 667 mg by mouth twice daily on Monday, Wednesday, and Friday    [provider]  Calcium  Acetate 667 MG TABS Take 667 mg by mouth See admin instructions. Take 667 mg by mouth three times daily on Tuesday, Thursday, Saturday, and Sunday    [provider]  EPINEPHrine  0.3 mg/0.3 mL IJ SOAJ injection Inject 0.3 mg into the muscle as needed for anaphylaxis. 01/25/21   Alphonsa Glendia LABOR, MD  Evolocumab  (REPATHA  SURECLICK) 140 MG/ML SOAJ Inject 140 mg into the skin every 14 (fourteen) days. 06/29/22   Vannie Reche RAMAN, NP  fentaNYL  (DURAGESIC ) 25 MCG/HR Place 1 patch onto the skin every 3 (three) days. 02/16/23   Ghimire, Donalda CHRISTELLA, MD  gabapentin (NEURONTIN) 100 MG capsule Take 100 mg by mouth at bedtime.     [provider]  hydrALAZINE  (APRESOLINE ) 25 MG tablet Take 1 tablet (25 mg total) by mouth every 8 (eight) hours. 02/16/23   Ghimire, Donalda CHRISTELLA, MD  insulin  lispro (HUMALOG KWIKPEN) 100 UNIT/ML KwikPen Inject 0-15 Units into the skin 3 (three) times daily. BS 0-120 inject 0 units BS 121-150 inject 2 units  BS 151-200 inject 3 units  BS 201-250 inject 5 units  BS 251-300 inject 8 units  BS 301-350 inject 11 units  BS 351-400 inject 15 units BS 401-999 inject 17 units and call provider    [provider]  isosorbide  mononitrate (IMDUR ) 30 MG 24 hr tablet Take 1 tablet (30 mg total) by mouth daily. 02/16/23   Ghimire, Donalda CHRISTELLA, MD  levothyroxine  (SYNTHROID ) 25 MCG tablet Take 25 mcg by mouth daily.    [provider]  metoprolol  succinate (TOPROL -XL) 50 MG 24 hr tablet Take 1 tablet (50 mg total) by mouth daily. Take with or immediately following a meal. 02/16/23   Ghimire, Donalda CHRISTELLA, MD  ondansetron  (ZOFRAN ) 4 MG/2ML SOLN injection Inject 4 mg into the vein daily. PRN order: inject 4 mg every 8 hours as needed for nausea and vomiting    [provider]  OXYGEN Inhale 2 L into the lungs continuous.    [provider]  pantoprazole  (PROTONIX ) 40 MG tablet Take 1 tablet (40 mg total) by mouth 2 (  two) times daily. 05/30/22   Evonnie Lenis, MD  polyethylene glycol (MIRALAX ) 17 g packet Take 17 g by mouth daily. 11/29/22   Tharon Lung, MD  sertraline  (ZOLOFT ) 100 MG tablet Take 1 tablet (100 mg total) by mouth daily. 11/29/22   Tharon Lung, MD  UNABLE TO FIND Take 1 Dose by mouth in the morning and at bedtime. Med Name: Med plus 2.0    [provider]      Allergies    Bee venom, Atorvastatin , Diltiazem , Lopressor  [metoprolol ], Oxycodone , Reglan  [metoclopramide ], Rosuvastatin , and Valsartan     Review of Systems   Review of Systems  Unable to perform ROS: Acuity of condition    Physical Exam Updated Vital Signs Wt 77.6 kg   BMI 24.55 kg/m   Physical Exam Vitals and nursing note reviewed.  Constitutional:      General: He is not in acute distress.    Appearance: Normal appearance. He is well-developed. He is not toxic-appearing.  HENT:     Head: Normocephalic and atraumatic.  Eyes:     General: Lids are normal.     Conjunctiva/sclera: Conjunctivae normal.     Pupils: Pupils are equal, round, and reactive to light.  Neck:     Thyroid : No thyroid  mass.     Trachea: No tracheal deviation.  Cardiovascular:     Rate and Rhythm: Normal rate and regular rhythm.     Heart sounds: Normal heart sounds. No murmur heard.    No gallop.  Pulmonary:     Effort: Pulmonary effort is normal. No respiratory distress.     Breath sounds: Normal breath sounds. No stridor. No decreased breath sounds, wheezing, rhonchi or rales.  Abdominal:     General: There is no distension.     Palpations: Abdomen is soft.     Tenderness: There is no abdominal tenderness. There is no rebound.  Musculoskeletal:        General: No tenderness. Normal range of motion.     Cervical back: Normal range of motion and neck supple.  Skin:    General: Skin is warm and dry.     Findings: No abrasion or rash.  Neurological:     Mental Status: He is oriented to person, place, and time. He is lethargic.     GCS: GCS eye subscore is 4. GCS verbal subscore is 5. GCS motor subscore is 6.     Cranial Nerves: Cranial nerves 2-12 are intact. No cranial nerve deficit.     Sensory: No sensory deficit.     Motor: Weakness and atrophy present.     Comments: Withdraws to pain in all 4 extremities  Psychiatric:        Attention and Perception: He is inattentive.        Mood and Affect: Affect is blunt.        Speech: Speech is tangential.     ED Results / Procedures / Treatments   Labs (all labs ordered are listed, but only abnormal results are displayed) Labs Reviewed  I-STAT CHEM 8, ED - Abnormal; Notable for the following components:      Result Value   Chloride  97 (*)    BUN 38 (*)    Creatinine, Ser 5.80 (*)    Glucose, Bld 102 (*)    Calcium , Ion 1.04 (*)    Hemoglobin 11.9 (*)    HCT 35.0 (*)    All other components within normal limits  CBG MONITORING, ED - Abnormal; Notable for  the following components:   Glucose-Capillary 100 (*)    All other components within normal limits  RESP PANEL BY RT-PCR (RSV, FLU A&B, COVID)  RVPGX2  CULTURE, BLOOD (ROUTINE X 2)  CULTURE, BLOOD (ROUTINE X 2)  PROTIME-INR  APTT  CBC  DIFFERENTIAL  COMPREHENSIVE METABOLIC PANEL  ETHANOL  URINALYSIS, W/ REFLEX TO CULTURE (INFECTION SUSPECTED)  TSH  I-STAT CG4 LACTIC ACID, ED    EKG EKG Interpretation Date/Time:  Tuesday June 27 2023 16:06:56 EST Ventricular Rate:  60 PR Interval:  206 QRS Duration:  139 QT Interval:  533 QTC Calculation: 533 R Axis:   119  Text Interpretation: Sinus rhythm Nonspecific intraventricular conduction delay Nonspecific repol abnormality, diffuse leads Confirmed by Dasie Faden ([id number]) on 06/27/2023 6:15:38 PM  Radiology CT HEAD CODE STROKE WO CONTRAST Result Date: 06/27/2023 CLINICAL DATA:  Code stroke.  Slurred speech EXAM: CT HEAD WITHOUT CONTRAST TECHNIQUE: Contiguous axial images were obtained from the base of the skull through the vertex without intravenous contrast. RADIATION DOSE REDUCTION: This exam was performed according to the departmental dose-optimization program which includes automated exposure control, adjustment of the mA and/or kV according to patient size and/or use of iterative reconstruction technique. COMPARISON:  05/31/23 CT head. FINDINGS: Brain: No hemorrhage. No hydrocephalus. No extra-axial fluid collection. No CT evidence of an acute cortical infarct. No mass effect. No mass lesion. There is background of moderate chronic microvascular ischemic change. Generalized volume loss. Vascular: No hyperdense vessel or unexpected calcification. Skull: Normal. Negative for fracture or focal lesion.  Sinuses/Orbits: Middle ear or mastoid effusion. Paranasal sinuses are notable for frothy secretions in the right sphenoid sinus. Bilateral lens replacement. Orbits are otherwise unremarkable. Other: No ASPECTS ([place] Stroke Program Early CT Score): 10 IMPRESSION: No hemorrhage or CT evidence of an acute cortical infarct. Aspects is 10. Findings were paged to Dr. Michaela on 06/27/23 at 3:21 PM. Electronically Signed   By: Lyndall Gore M.D.   On: 06/27/2023 15:21    Procedures Procedures    Medications Ordered in ED Medications  sodium chloride  flush (NS) 0.9 % injection 3 mL (has no administration in time range)  iohexol  (OMNIPAQUE ) 350 MG/ML injection 100 mL (100 mLs Intravenous Contrast Given 06/27/23 1525)    ED Course/ Medical Decision Making/ A&P                                 Medical Decision Making Amount and/or Complexity of Data Reviewed Labs: ordered. Radiology: ordered.  Risk Prescription drug management.  Patient is EKG shows normal sinus rhythm.  No signs of acute ischemic changes noted.  Chest x-ray consistent with pneumonia.  Started on IV antibiotics. Patient seen by neurology, Dr. Michaela, who reviewed patient's CAT scan of his brain which shows no evidence of hemorrhage.  Feels that patient is experiencing more altered mental status as opposed to acute CVA.  Recommends medical evaluation.  6:56 PM Patient's MRI is positive for stroke as communicated to me by Dr. Michaela.  Discussed with hospitalist team and patient to be admitted   CRITICAL CARE Performed by: Faden ONEIDA Dasie Total critical care time: 55 minutes Critical care time was exclusive of separately billable procedures and treating other patients. Critical care was necessary to treat or prevent imminent or life-threatening deterioration. Critical care was time spent personally by me on the following activities: development of treatment plan with patient and/or surrogate as well as nursing,  discussions with  consultants, evaluation of patient's response to treatment, examination of patient, obtaining history from patient or surrogate, ordering and performing treatments and interventions, ordering and review of laboratory studies, ordering and review of radiographic studies, pulse oximetry and re-evaluation of patient's condition.        Final Clinical Impression(s) / ED Diagnoses Final diagnoses:  None    Rx / DC Orders ED Discharge Orders     None         Dasie Faden, MD 06/27/23 ZELPHA    Dasie Faden, MD 06/27/23 [phone]

## 2023-06-27 NOTE — ED Triage Notes (Addendum)
 Patient BIB [facility] from [facility] care for stroke like symptoms. Last well known 1100, symptoms discovered at 1230, Patient had slurred speech and altered mental status, no history of stroke,   Per Patients caretaker-Dialysis usually done M,W,F but last dialysis was on Thursday, Hx of a TIA last year, hx of falls with right hip fracture about 2-3 months ago and hx of pneumonia diagnosed 1 wk ago.   Fentanyl  patch removed today that was placed on 1/12

## 2023-06-27 NOTE — Assessment & Plan Note (Signed)
continue aspirin

## 2023-06-27 NOTE — Assessment & Plan Note (Addendum)
 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 Final radiology read pending.  - obtain echocardiogram  - Neurology has recommended starting aspirin  and Plavix . However unable to take oral due to sedation from IV ativan . Will re-attempt later this evening.  -Obtain A1c and lipids -PT/OT/SLT -Frequent neuro checks and keep on telemetry -Allow for permissive hypertension with blood pressure treatment as needed only if systolic goes above 220- although somewhat unclear what his last known well time was

## 2023-06-27 NOTE — ED Notes (Signed)
 Back from MRI, pt has been repositioned and changed,

## 2023-06-28 ENCOUNTER — Observation Stay (HOSPITAL_COMMUNITY): Payer: Medicare Other

## 2023-06-28 ENCOUNTER — Encounter (HOSPITAL_COMMUNITY): Payer: Self-pay | Admitting: Family Medicine

## 2023-06-28 DIAGNOSIS — I6340 Cerebral infarction due to embolism of unspecified cerebral artery: Secondary | ICD-10-CM | POA: Diagnosis present

## 2023-06-28 DIAGNOSIS — N186 End stage renal disease: Secondary | ICD-10-CM | POA: Diagnosis present

## 2023-06-28 DIAGNOSIS — I6310 Cerebral infarction due to embolism of unspecified precerebral artery: Secondary | ICD-10-CM

## 2023-06-28 DIAGNOSIS — R627 Adult failure to thrive: Secondary | ICD-10-CM | POA: Diagnosis not present

## 2023-06-28 DIAGNOSIS — R630 Anorexia: Secondary | ICD-10-CM | POA: Diagnosis not present

## 2023-06-28 DIAGNOSIS — I639 Cerebral infarction, unspecified: Secondary | ICD-10-CM | POA: Diagnosis present

## 2023-06-28 DIAGNOSIS — Z992 Dependence on renal dialysis: Secondary | ICD-10-CM | POA: Diagnosis not present

## 2023-06-28 DIAGNOSIS — I6389 Other cerebral infarction: Secondary | ICD-10-CM | POA: Diagnosis not present

## 2023-06-28 DIAGNOSIS — I132 Hypertensive heart and chronic kidney disease with heart failure and with stage 5 chronic kidney disease, or end stage renal disease: Secondary | ICD-10-CM | POA: Diagnosis present

## 2023-06-28 DIAGNOSIS — I5042 Chronic combined systolic (congestive) and diastolic (congestive) heart failure: Secondary | ICD-10-CM | POA: Diagnosis present

## 2023-06-28 DIAGNOSIS — N2581 Secondary hyperparathyroidism of renal origin: Secondary | ICD-10-CM | POA: Diagnosis present

## 2023-06-28 DIAGNOSIS — D631 Anemia in chronic kidney disease: Secondary | ICD-10-CM | POA: Diagnosis present

## 2023-06-28 DIAGNOSIS — G9349 Other encephalopathy: Secondary | ICD-10-CM | POA: Diagnosis present

## 2023-06-28 DIAGNOSIS — E1140 Type 2 diabetes mellitus with diabetic neuropathy, unspecified: Secondary | ICD-10-CM | POA: Diagnosis present

## 2023-06-28 DIAGNOSIS — J690 Pneumonitis due to inhalation of food and vomit: Secondary | ICD-10-CM | POA: Diagnosis present

## 2023-06-28 DIAGNOSIS — G8194 Hemiplegia, unspecified affecting left nondominant side: Secondary | ICD-10-CM | POA: Diagnosis present

## 2023-06-28 DIAGNOSIS — L89152 Pressure ulcer of sacral region, stage 2: Secondary | ICD-10-CM | POA: Diagnosis present

## 2023-06-28 DIAGNOSIS — Z7189 Other specified counseling: Secondary | ICD-10-CM | POA: Diagnosis not present

## 2023-06-28 DIAGNOSIS — E039 Hypothyroidism, unspecified: Secondary | ICD-10-CM | POA: Diagnosis present

## 2023-06-28 DIAGNOSIS — N390 Urinary tract infection, site not specified: Secondary | ICD-10-CM | POA: Diagnosis present

## 2023-06-28 DIAGNOSIS — J9811 Atelectasis: Secondary | ICD-10-CM | POA: Diagnosis present

## 2023-06-28 DIAGNOSIS — I471 Supraventricular tachycardia, unspecified: Secondary | ICD-10-CM | POA: Diagnosis present

## 2023-06-28 DIAGNOSIS — G928 Other toxic encephalopathy: Secondary | ICD-10-CM | POA: Diagnosis present

## 2023-06-28 DIAGNOSIS — I330 Acute and subacute infective endocarditis: Secondary | ICD-10-CM | POA: Diagnosis present

## 2023-06-28 DIAGNOSIS — R7881 Bacteremia: Secondary | ICD-10-CM | POA: Diagnosis present

## 2023-06-28 DIAGNOSIS — E1122 Type 2 diabetes mellitus with diabetic chronic kidney disease: Secondary | ICD-10-CM | POA: Diagnosis present

## 2023-06-28 DIAGNOSIS — R1310 Dysphagia, unspecified: Secondary | ICD-10-CM | POA: Diagnosis not present

## 2023-06-28 DIAGNOSIS — I6521 Occlusion and stenosis of right carotid artery: Secondary | ICD-10-CM | POA: Diagnosis present

## 2023-06-28 DIAGNOSIS — F39 Unspecified mood [affective] disorder: Secondary | ICD-10-CM | POA: Diagnosis present

## 2023-06-28 DIAGNOSIS — Z515 Encounter for palliative care: Secondary | ICD-10-CM | POA: Diagnosis not present

## 2023-06-28 DIAGNOSIS — Z66 Do not resuscitate: Secondary | ICD-10-CM | POA: Diagnosis present

## 2023-06-28 LAB — CBC
HCT: 32.4 % — ABNORMAL LOW (ref 39.0–52.0)
Hemoglobin: 9.8 g/dL — ABNORMAL LOW (ref 13.0–17.0)
MCH: 24.6 pg — ABNORMAL LOW (ref 26.0–34.0)
MCHC: 30.2 g/dL (ref 30.0–36.0)
MCV: 81.2 fL (ref 80.0–100.0)
Platelets: 235 10*3/uL (ref 150–400)
RBC: 3.99 MIL/uL — ABNORMAL LOW (ref 4.22–5.81)
RDW: 19.4 % — ABNORMAL HIGH (ref 11.5–15.5)
WBC: 8 10*3/uL (ref 4.0–10.5)
nRBC: 0 % (ref 0.0–0.2)

## 2023-06-28 LAB — BLOOD CULTURE ID PANEL (REFLEXED) - BCID2

## 2023-06-28 LAB — HEMOGLOBIN A1C
Hgb A1c MFr Bld: 6.4 % — ABNORMAL HIGH (ref 4.8–5.6)
Mean Plasma Glucose: 136.98 mg/dL

## 2023-06-28 LAB — URINALYSIS, W/ REFLEX TO CULTURE (INFECTION SUSPECTED)
Bilirubin Urine: NEGATIVE
Glucose, UA: 50 mg/dL — AB
Ketones, ur: 5 mg/dL — AB
Nitrite: NEGATIVE
Protein, ur: 100 mg/dL — AB
Specific Gravity, Urine: 1.021 (ref 1.005–1.030)
pH: 6 (ref 5.0–8.0)

## 2023-06-28 LAB — LIPID PANEL
Cholesterol: 155 mg/dL (ref 0–200)
HDL: 57 mg/dL (ref >40)
LDL Cholesterol: 75 mg/dL (ref 0–99)
Total CHOL/HDL Ratio: 2.7 {ratio}
Triglycerides: 114 mg/dL (ref <150)
VLDL: 23 mg/dL (ref 0–40)

## 2023-06-28 LAB — GLUCOSE, CAPILLARY: Glucose-Capillary: 93 mg/dL (ref 70–99)

## 2023-06-28 LAB — RENAL FUNCTION PANEL
Albumin: 2.6 g/dL — ABNORMAL LOW (ref 3.5–5.0)
Anion gap: 19 — ABNORMAL HIGH (ref 5–15)
BUN: 47 mg/dL — ABNORMAL HIGH (ref 8–23)
CO2: 20 mmol/L — ABNORMAL LOW (ref 22–32)
Calcium: 8.4 mg/dL — ABNORMAL LOW (ref 8.9–10.3)
Chloride: 97 mmol/L — ABNORMAL LOW (ref 98–111)
Creatinine, Ser: 6.3 mg/dL — ABNORMAL HIGH (ref 0.61–1.24)
GFR, Estimated: 9 mL/min — ABNORMAL LOW (ref >60)
Glucose, Bld: 89 mg/dL (ref 70–99)
Phosphorus: 4.6 mg/dL (ref 2.5–4.6)
Potassium: 4 mmol/L (ref 3.5–5.1)
Sodium: 136 mmol/L (ref 135–145)

## 2023-06-28 LAB — ECHOCARDIOGRAM COMPLETE
Area-P 1/2: 4.4 cm2
Height: 70 in
S' Lateral: 5.3 cm
Weight: 2880 [oz_av]

## 2023-06-28 LAB — HEPATITIS B SURFACE ANTIGEN: Hepatitis B Surface Ag: NONREACTIVE

## 2023-06-28 MED ORDER — ANTICOAGULANT SODIUM CITRATE 4% (200MG/5ML) IV SOLN: 5.0000 mL | Status: DC | PRN

## 2023-06-28 MED ORDER — HEPARIN SODIUM (PORCINE) 1000 UNIT/ML DIALYSIS: 1000.0000 [IU] | INTRAMUSCULAR | Status: DC | PRN

## 2023-06-28 MED ORDER — LEVOTHYROXINE SODIUM 50 MCG PO TABS
50.0000 ug | ORAL_TABLET | Freq: Every day | ORAL | Status: DC
Start: 2023-06-29 — End: 2023-07-14
  Administered 2023-07-02 – 2023-07-14 (×7): 50 ug via ORAL
  Filled 2023-06-28 (×9): qty 1

## 2023-06-28 MED ORDER — PENTAFLUOROPROP-TETRAFLUOROETH EX AERO: 1.0000 | INHALATION_SPRAY | CUTANEOUS | Status: DC | PRN

## 2023-06-28 MED ORDER — MORPHINE SULFATE (PF) 2 MG/ML IV SOLN
2.0000 mg | INTRAVENOUS | Status: DC | PRN
  Administered 2023-06-28 – 2023-07-03 (×5): 2 mg via INTRAVENOUS
  Filled 2023-06-28 (×5): qty 1

## 2023-06-28 MED ORDER — LIDOCAINE HCL (PF) 1 % IJ SOLN: 5.0000 mL | INTRAMUSCULAR | Status: DC | PRN

## 2023-06-28 MED ORDER — HEPARIN SODIUM (PORCINE) 1000 UNIT/ML DIALYSIS: 5000.0000 [IU] | INTRAMUSCULAR | Status: DC | PRN

## 2023-06-28 MED ORDER — LIDOCAINE-PRILOCAINE 2.5-2.5 % EX CREA: 1.0000 | TOPICAL_CREAM | CUTANEOUS | Status: DC | PRN

## 2023-06-28 MED ORDER — SODIUM CHLORIDE 0.9 % IV SOLN: INTRAVENOUS | Status: DC

## 2023-06-28 MED ORDER — ALTEPLASE 2 MG IJ SOLR: 2.0000 mg | Freq: Once | INTRAMUSCULAR | Status: DC | PRN

## 2023-06-28 MED ORDER — CHLORHEXIDINE GLUCONATE CLOTH 2 % EX PADS
6.0000 | MEDICATED_PAD | Freq: Every day | CUTANEOUS | Status: DC
  Administered 2023-06-28: 6 via TOPICAL

## 2023-06-28 NOTE — Progress Notes (Addendum)
 STROKE TEAM PROGRESS NOTE   BRIEF HPI [name] is a 68 y.o. male with history of stage renal disease on dialysis, diabetes, stroke, CAD, SVT and heart failure who lives in a skilled nursing facility presenting with acute onset altered mental status.  He was noted to have missed several dialysis treatments and also has aspiration pneumonia.  Brain MRI demonstrates multiple punctate embolic appearing infarcts,    NIH on Admission 16  INTERIM HISTORY/SUBJECTIVE Patient has remained hemodynamically stable overnight.  He is noted to be uremic with a BUN of 39 and creatinine of 5.66.[name]  He is drowsy on exam and encephalopathic with multifocal myoclonus and asterixis  OBJECTIVE  CBC    Component Value Date/Time   WBC 7.9 06/27/2023 1540   RBC 4.42 06/27/2023 1540   HGB 10.7 (L) 06/27/2023 1540   HGB 9.0 (L) 11/01/2021 1439   HCT 36.3 (L) 06/27/2023 1540   HCT 28.1 (L) 11/01/2021 1439   PLT 223 06/27/2023 1540   PLT 254 11/01/2021 1439   MCV 82.1 06/27/2023 1540   MCV 88 11/01/2021 1439   MCH 24.2 (L) 06/27/2023 1540   MCHC 29.5 (L) 06/27/2023 1540   RDW 19.6 (H) 06/27/2023 1540   RDW 15.9 (H) 11/01/2021 1439   LYMPHSABS 0.9 06/27/2023 1540   LYMPHSABS 0.9 10/07/2021 1202   MONOABS 0.7 06/27/2023 1540   EOSABS 0.1 06/27/2023 1540   EOSABS 0.2 10/07/2021 1202   BASOSABS 0.1 06/27/2023 1540   BASOSABS 0.1 10/07/2021 1202    BMET    Component Value Date/Time   NA 136 06/27/2023 1540   NA 138 10/07/2021 1202   K 4.1 06/27/2023 1540   CL 96 (L) 06/27/2023 1540   CO2 25 06/27/2023 1540   GLUCOSE 101 (H) 06/27/2023 1540   BUN 39 (H) 06/27/2023 1540   BUN 42 (H) 10/07/2021 1202   CREATININE 5.66 (H) 06/27/2023 1540   CREATININE 1.94 (H) 05/13/2019 1029   CALCIUM  8.8 (L) 06/27/2023 1540   EGFR 22 (L) 10/07/2021 1202   GFRNONAA 10 (L) 06/27/2023 1540    IMAGING past 24 hours MR BRAIN WO CONTRAST Result Date: 06/27/2023 CLINICAL DATA:  Stroke, follow up EXAM: MRI  HEAD WITHOUT CONTRAST TECHNIQUE: Multiplanar, multiecho pulse sequences of the brain and surrounding structures were obtained without intravenous contrast. COMPARISON:  CT Head 06/27/23 FINDINGS: Brain: There are multiple scattered foci of acute infarct in the bilateral cerebral hemispheres, including the right parietal lobe (series 5, image 89), in the left frontal lobe (series 5, image 93), right frontal lobe (series 5, image 79), and left parietal lobe (series 5, image 79). There are multiple scattered foci of microhemorrhage, predominantly in a central distribution. There is a background of moderate to severe chronic microvascular ischemic change. Vascular: Normal flow voids. Skull and upper cervical spine: Normal marrow signal. Sinuses/Orbits: Trace left mastoid effusion. No middle ear effusion. Paranasal sinuses are clear. Bilateral lens replacement. Orbits are otherwise unremarkable. Other: None. IMPRESSION: Multiple scattered foci of acute infarct in the bilateral cerebral hemispheres, as described above. No hemorrhage or mass effect. Given distribution, these are favored to be secondary to a central embolic etiology. Findings were paged to Dr. Alecia Ames on 06/27/23 at 6:29 PM. Electronically Signed   By: Clora Dane M.D.   On: 06/27/2023 18:29   DG Chest Port 1 View Result Date: 06/27/2023 CLINICAL DATA:  Shortness of breath. EXAM: PORTABLE CHEST 1 VIEW COMPARISON:  Chest radiograph dated May 31, 2023. FINDINGS: Mild cardiomegaly, unchanged.  Prior median sternotomy and CABG. Small right pleural effusion with right basilar atelectasis and patchy opacities, similar to slightly increased compared to the prior exam. Streaky left basilar opacities. No pneumothorax. No acute osseous abnormality. Overlapping telemetry wires. IMPRESSION: 1. Small right pleural effusion with right basilar atelectasis, similar to slightly increased compared to the prior exam. Patchy right basilar opacities may reflect  atelectasis or infiltrate. 2. Streaky left basilar opacities could reflect atelectasis or infiltrate. Electronically Signed   By: Mannie Seek M.D.   On: 06/27/2023 16:26   CT ANGIO HEAD NECK W WO CM W PERF (CODE STROKE) Result Date: 06/27/2023 CLINICAL DATA:  Altered mental status, slurred speech, stroke suspected EXAM: CT ANGIOGRAPHY HEAD AND NECK CT PERFUSION BRAIN TECHNIQUE: Multidetector CT imaging of the head and neck was performed using the standard protocol during bolus administration of intravenous contrast. Multiplanar CT image reconstructions and MIPs were obtained to evaluate the vascular anatomy. Carotid stenosis measurements (when applicable) are obtained utilizing NASCET criteria, using the distal internal carotid diameter as the denominator. Multiphase CT imaging of the brain was performed following IV bolus contrast injection. Subsequent parametric perfusion maps were calculated using RAPID software. RADIATION DOSE REDUCTION: This exam was performed according to the departmental dose-optimization program which includes automated exposure control, adjustment of the mA and/or kV according to patient size and/or use of iterative reconstruction technique. CONTRAST:  OMNIPAQUE  IOHEXOL  350 MG/ML SOLN COMPARISON:  No prior CTA available, correlation is made with 06/27/2023 CT stroke code and MRA head 12/14/2020 FINDINGS: CT HEAD FINDINGS For noncontrast findings, please see same day CT head. CTA NECK FINDINGS Aortic arch: Standard branching. Imaged portion shows no evidence of aneurysm or dissection. No significant stenosis of the major arch vessel origins. Aortic atherosclerosis. Right carotid system: 60% stenosis in the proximal right ICA (series 6, image 209). No evidence of dissection. Left carotid system: No evidence of dissection, occlusion, or hemodynamically significant stenosis (greater than 50%). Vertebral arteries: Severe stenosis at the origin of the right vertebral artery (series  6, image 284), with additional severe stenosis in the right V2 segment (series 6, image 210). The left vertebral artery is patent from its origin to the skull base without significant stenosis. No evidence of dissection. Skeleton: No acute osseous abnormality. Degenerative changes in the cervical spine. Other neck: No acute finding. Upper chest: Moderate right and trace left pleural effusions. Review of the MIP images confirms the above findings CTA HEAD FINDINGS Anterior circulation: Both internal carotid arteries are patent to the termini, with severe stenosis in the proximal right supraclinoid ICA (series 6, image 113) and moderate stenosis in left supraclinoid ICA (series 6, images 107-108). A1 segments patent, with mild stenosis in the distal right A1. Normal anterior communicating artery. Anterior cerebral arteries are patent to their distal aspects without significant stenosis. No M1 stenosis or occlusion. MCA branches perfused to their distal aspects without significant stenosis. Posterior circulation: Severe stenosis in the right V4 just past the dural reflection (series 6, image 166), with an additional segment of severe stenosis in the proximal to mid V4 (series 6, image 150-159), and complete occlusion of the mid to distal right V4 (series 6, images 140-144). The proximal stenosis in mid to distal occlusion appears similar to the prior MRA from 2022. The left vertebral artery is dominant and patent to the vertebrobasilar junction without significant stenosis. Posterior inferior cerebellar arteries patent proximally. Basilar patent to its distal aspect without significant stenosis. Superior cerebellar arteries patent proximally. Patent P1 segments.  PCAs perfused to their distal aspects without significant stenosis. The bilateral posterior communicating arteries are not visualized. Venous sinuses: As permitted by contrast timing, patent. Anatomic variants: None significant. No evidence of aneurysm or  vascular malformation. Review of the MIP images confirms the above findings CT Brain Perfusion Findings: ASPECTS: 10 CBF (<30%) Volume: 0mL Perfusion (Tmax>6.0s) volume: 13mL Mismatch Volume: 13mL Infarction Location:No infarct core detected. Area of possible penumbra in the superior right cerebellum, favored to be artifactual. IMPRESSION: 1. 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 2. Severe stenosis in the proximal right supraclinoid ICA and moderate stenosis in the left supraclinoid ICA. 3. 60% stenosis in the proximal right ICA. 4. Severe stenosis at the origin of the right vertebral artery, with additional severe stenosis in the right V2 segment. 5. No infarct core detected. Area of possible penumbra in the superior right cerebellum, favored to be artifactual. 6. Moderate right and trace left pleural effusions. 7. Aortic atherosclerosis. Aortic Atherosclerosis (ICD10-I70.0). Imaging results were communicated on 06/27/2023 at 3:49 pm to provider Dr. Alecia Ames via secure text paging. Electronically Signed   By: Zoila Hines M.D.   On: 06/27/2023 15:49   CT HEAD CODE STROKE WO CONTRAST Result Date: 06/27/2023 CLINICAL DATA:  Code stroke.  Slurred speech EXAM: CT HEAD WITHOUT CONTRAST TECHNIQUE: Contiguous axial images were obtained from the base of the skull through the vertex without intravenous contrast. RADIATION DOSE REDUCTION: This exam was performed according to the departmental dose-optimization program which includes automated exposure control, adjustment of the mA and/or kV according to patient size and/or use of iterative reconstruction technique. COMPARISON:  05/31/23 CT head. FINDINGS: Brain: No hemorrhage. No hydrocephalus. No extra-axial fluid collection. No CT evidence of an acute cortical infarct. No mass effect. No mass lesion. There is background of moderate chronic microvascular ischemic  change. Generalized volume loss. Vascular: No hyperdense vessel or unexpected calcification. Skull: Normal. Negative for fracture or focal lesion. Sinuses/Orbits: Middle ear or mastoid effusion. Paranasal sinuses are notable for frothy secretions in the right sphenoid sinus. Bilateral lens replacement. Orbits are otherwise unremarkable. Other: No ASPECTS ([place] Stroke Program Early CT Score): 10 IMPRESSION: No hemorrhage or CT evidence of an acute cortical infarct. Aspects is 10. Findings were paged to Dr. Alecia Ames on 06/27/23 at 3:21 PM. Electronically Signed   By: Clora Dane M.D.   On: 06/27/2023 15:21    Vitals:   06/28/23 0237 06/28/23 0400 06/28/23 0708 06/28/23 0729  BP: (!) 152/67 (!) 151/70 (!) 162/93   Pulse: 63 63 66   Resp: 12 13 15    Temp:    98 F (36.7 C)  TempSrc:    Oral  SpO2: 94% 95% 94%   Weight:      Height:         PHYSICAL EXAM General: Drowsy, chronically ill-appearing patient in no acute distress Psych:  Mood and affect appropriate for situation CV: Regular rate and rhythm on monitor Respiratory:  Regular, unlabored respirations on room air Asterixis noted to bilateral upper extremities  NEURO:  Mental Status: Drowsy, responds to name, able to state correct city but not able to state where he is or describe his situation Speech/Language: speech is mildly dysarthric and in single words or short phrases  Cranial Nerves:  II: PERRL.  III, IV, VI: EOMI.  V: Sensation is intact to light touch and symmetrical to face.  VII: Subtle  right facial droop VIII: hearing intact to voice. IX, X: Voice is slightly dysarthric XII: tongue is midline without fasciculations. Motor: Able to move bilateral upper extremities with antigravity strength, moves lower extremities but does not lift them off the bed.  Asterixis present.  Myoclonus present intermittently Tone: is normal and bulk is normal Sensation- Intact to light touch in upper extremities, intact to noxious  in lower extremities Coordination: Unable to perform Reflexes-brisk patellar reflexes, positive jaw jerk Gait- deferred  Most Recent NIH  1a Level of Conscious.: 1 1b LOC Questions: 2 1c LOC Commands: 1 2 Best Gaze: 0 3 Visual: 0 4 Facial Palsy: 1 5a Motor Arm - left: 2 5b Motor Arm - Right: 2 6a Motor Leg - Left: 3 6b Motor Leg - Right: 3 7 Limb Ataxia: 0 8 Sensory: 0 9 Best Language: 0 10 Dysarthria: 1 11 Extinct. 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 ASPECTS 10.    CTA head & neck complete occlusion of mid to distal right V4 and severe stenosis in proximal right V4, severe stenosis in proximal right supraclinoid ICA and moderate stenosis in left supraclinoid ICA, 60% stenosis in proximal right ICA, severe gnosis of the origin of the right vertebral artery and V2 segment CT perfusion no infarct core or penumbra detected MRI multiple foci of scattered acute infarcts in bilateral cerebral hemispheres 2D Echo ejection fraction 45 to 50%.  Left atrium moderately dilated severe mitral annulus calcification LDL 24 HgbA1c 8.1 VTE prophylaxis -subcutaneous heparin  No antithrombotic prior to admission, now on aspirin  81 mg daily and clopidogrel  75 mg daily for 3 weeks and then aspirin  alone. Therapy recommendations:  Pending Disposition: Pending  Hypertension Home meds: Hydralazine  25 mg every 8 hours, metoprolol  50 mg daily Stable Blood Pressure Goal: BP less than 220/110   Hyperlipidemia Home meds: Repatha  140 mg every 2 weeks LDL 24, goal < 70 High intensity statin not indicated due to previous intolerance Continue statin at discharge  Diabetes type II Uncontrolled Home meds: Insulin  aspart sliding scale HgbA1c 8.1, goal < 7.0 CBGs SSI Recommend close  follow-up with PCP for better DM control  Dysphagia Patient has post-stroke dysphagia, SLP consulted    Diet   Diet NPO time specified   Advance diet as tolerated  End-stage renal disease on dialysis Patient is usually on Monday Wednesday Friday dialysis schedule but did refuse treatment while at facility Resume dialysis while inpatient Avoid contrast when able Renally dose medications as appropriate  Other Stroke Risk Factors Obesity, Body mass index is 25.83 kg/m., BMI >/= 30 associated with increased stroke risk, recommend weight loss, diet and exercise as appropriate  Coronary artery disease Congestive heart failure   Other Active Problems Community-acquired pneumonia-continue Rocephin  Urinary tract infection-continue Rocephin  SVT-continue home amiodarone   Hospital day # 0  Patient seen by NP with MD, MD to edit note as needed. Cortney E Bucky Cardinal , MSN, AGACNP-BC [facility] See Amion for schedule and pager information 06/28/2023 12:08 PM   STROKE MD : I have personally obtained history,examined this patient, reviewed notes, independently viewed imaging studies, participated in medical decision making and plan of care.ROS completed by me personally and pertinent positives fully documented  I have made any additions or clarifications directly to the above note. Agree with note above.  Patient presented with altered  mental status which is likely multifactorial given significant renal failure and encephalopathy but MRI does show by cerebral tiny punctate embolic infarcts which could be from paroxysmal A-fib versus calcific mitral.  Recommend dual antiplatelet therapy aspirin  Plavix  for 3 weeks followed by aspirin  alone and aggressive risk factor modification.  He will need prolonged cardiac monitoring at discharge to look for A-fib.  No family at the bedside.  Discussed with Dr. Martha Slack.  Greater than 50% time during this 50-minute visit was spent on counseling and  coordination of care and discussion with patient and care team and answering questions.  Ardella Beaver, MD Medical Director [facility] Pager: [phone] 06/28/2023 3:25 PM  To contact Stroke Continuity provider, please refer to [email or web address]. After hours, contact General Neurology

## 2023-06-28 NOTE — ED Notes (Signed)
 Patient left the floor in stable condition, with family and staff.

## 2023-06-28 NOTE — Consult Note (Signed)
 [facility] Renal Consultation Note    Indication for Consultation:  Management of ESRD/hemodialysis; anemia, hypertension/volume and secondary hyperparathyroidism , HPI: [name] is a 68 y.o. male with ESRD on HD, HTN, T2DM, CAD, h/oCVA., h/o right hip fx.  Recently treated for pneumonia. Presented from [facility] with ams, slurred speech. Code stroke called. Head CT negative. Not a candidate for thrombolytics. Brain MRI showing multiple embolic infarcts. Neurology following. Labs. Na 136, K 4.1, CO2 25, BUN 39, Hgb 10.7. Nephrology consult for dialysis.   Usual dialysis MWF. Last dialysis on 1/10. Did receive 3 treatments last week. Missed HD Monday.   Seen and examined in ED. Largely unresponsive.   Past Medical History:  Diagnosis Date   Acute blood loss anemia 03/20/2021   Acute metabolic encephalopathy 03/03/2022   Acute on chronic respiratory failure with hypoxia (HCC) 02/28/2022   Anemia    Arthritis    hips shoulders   CAD (coronary artery disease) 03/03/2019   Cancer (HCC)    Esophageal cancer 2022   Complete rotator cuff tear 08/26/2020   Depression 05/31/2021   Esophagitis    ESRD on hemodialysis (HCC)    Fatigue 10/14/2020   gram neg rod Bacteremia 03/01/2022   Heart failure with mildly reduced ejection fraction (HFmrEF) (HCC)    Hip fracture (HCC) 09/09/2022   Hypertension    Myocardial infarction (HCC)    Sleep apnea    uses a bipap machine   Stroke (HCC) 12/14/2020   no last weakness or paralysis   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   Past Surgical History:  Procedure Laterality Date   AV FISTULA PLACEMENT Left 12/21/2021   Procedure: LEFT ARM ARTERIOVENOUS (AV) FISTULA CREATION;  Surgeon: Mayo Speck, MD;  Location: [facility];  Service: Vascular;  Laterality: Left;   BIOPSY  06/27/2019   Procedure: BIOPSY;  Surgeon: Ruby Corporal, MD;  Location: [facility]  END[facility] SU[facility];  Service: Endoscopy;;  ascending colon polyp   BIOPSY  10/21/2020   Procedure: BIOPSY;  Surgeon: Ruby Corporal, MD;  Location: [facility];  Service: Endoscopy;;  esophageal,gastric polyp   BIOPSY  02/24/2021   Procedure: BIOPSY;  Surgeon: Ruby Corporal, MD;  [facility]: [facility];  Service: Endoscopy;;  distal and proximal esophageal biopsies    BIOPSY  05/30/2022   Procedure: BIOPSY;  Surgeon: Vinetta Greening, DO;  [facility]: [facility];  Service: Endoscopy;;   CARDIAC SURGERY     CATARACT EXTRACTION     COLONOSCOPY N/A 06/27/2019   [name]:Diverticulosis in the entire examined colon. tubular adenoma in ascending colon, 4mm tubular adenoma in prox sigmoid. external hemorrhoids   CORONARY ARTERY BYPASS GRAFT N/A 03/25/2019   Procedure: CORONARY ARTERY BYPASS GRAFTING (CABG) X  4 USING LEFT INTERNAL MAMMARY ARTERY AND RIGHT SAPHENOUS VEIN GRAFTS;  Surgeon: Hilarie Lovely, MD;  Location: [facility];  Service: Open Heart Surgery;  Laterality: N/A;   ESOPHAGEAL BRUSHING  03/08/2022   Procedure: ESOPHAGEAL BRUSHING;  Surgeon: Vinetta Greening, DO;  [facility]: [facility];  Service: Endoscopy;;   ESOPHAGOGASTRODUODENOSCOPY (EGD) WITH PROPOFOL  N/A 10/21/2020   rehman:Normal hypopharynx.Normal proximal esophagus and mid esophagus.Esophageal mucosal changes consistent with long-segment Barrett's esophagus. (Focal low-grade dysplasia and atypia proximally) z line irregular 37 cm from incisors, 3 cm HH, single gastric polyp (fundic gland) normal duodenal bulb/second portion of duodenum, proximal margin of Barrett's at 34 cm  ESOPHAGOGASTRODUODENOSCOPY (EGD) WITH PROPOFOL  N/A 02/24/2021   Procedure: ESOPHAGOGASTRODUODENOSCOPY (EGD) WITH PROPOFOL ;  Surgeon: Ruby Corporal, MD;  [facility];  Service: Endoscopy;  Laterality: N/A;  1:35   ESOPHAGOGASTRODUODENOSCOPY (EGD) WITH PROPOFOL  N/A 03/21/2021   Procedure: ESOPHAGOGASTRODUODENOSCOPY (EGD) WITH PROPOFOL ;  Surgeon:  Alvis Jourdain, MD;  Location: [facility] ENDOSCOPY;  Service: Endoscopy;  Laterality: N/A;   ESOPHAGOGASTRODUODENOSCOPY (EGD) WITH PROPOFOL  N/A 10/23/2021   Procedure: ESOPHAGOGASTRODUODENOSCOPY (EGD) WITH PROPOFOL ;  Surgeon: Albertina Hugger, MD;  Location: [facility] ENDOSCOPY;  Service: Gastroenterology;  Laterality: N/A;   ESOPHAGOGASTRODUODENOSCOPY (EGD) WITH PROPOFOL  N/A 03/08/2022   Procedure: ESOPHAGOGASTRODUODENOSCOPY (EGD) WITH PROPOFOL ;  Surgeon: Vinetta Greening, DO;  [facility];  Service: Endoscopy;  Laterality: N/A;   ESOPHAGOGASTRODUODENOSCOPY (EGD) WITH PROPOFOL  N/A 05/27/2022   Procedure: ESOPHAGOGASTRODUODENOSCOPY (EGD) WITH PROPOFOL ;  Surgeon: Urban Garden, MD;  [facility];  Service: Gastroenterology;  Laterality: N/A;   ESOPHAGOGASTRODUODENOSCOPY (EGD) WITH PROPOFOL  N/A 05/30/2022   Procedure: ESOPHAGOGASTRODUODENOSCOPY (EGD) WITH PROPOFOL ;  Surgeon: Vinetta Greening, DO;  [facility];  Service: Endoscopy;  Laterality: N/A;   EXCISION MORTON'S NEUROMA     EYE SURGERY Left    retina   INCISION AND DRAINAGE OF WOUND Right 06/28/2021   Procedure: IRRIGATION AND DEBRIDEMENT WOUND;  Surgeon: Barb Bonito, MD;  Location: [facility];  Service: Plastics;  Laterality: Right;  1.5 hour   INSERTION OF DIALYSIS CATHETER Right 12/21/2021   Procedure: INSERTION OF TUNNELED DIALYSIS CATHETER;  Surgeon: Mayo Speck, MD;  Location: [facility];  Service: Vascular;  Laterality: Right;   INTRAMEDULLARY (IM) NAIL INTERTROCHANTERIC Right 09/10/2022   Procedure: INTRAMEDULLARY (IM) NAIL INTERTROCHANTERIC;  Surgeon: Murleen Arms, MD;  Location: [facility];  Service: Orthopedics;  Laterality: Right;   LEFT HEART CATH AND CORS/GRAFTS ANGIOGRAPHY N/A 02/01/2022   Procedure: LEFT HEART CATH AND CORS/GRAFTS ANGIOGRAPHY;  Surgeon: [place]az[place], Peter M, MD;  Location: [facility] INVASIVE CV LAB;  Service: Cardiovascular;  Laterality: N/A;   POLYPECTOMY  06/27/2019   Procedure:  POLYPECTOMY;  Surgeon: Ruby Corporal, MD;  [facility];  Service: Endoscopy;;  proximal sigmoid colon   RIGHT/LEFT HEART CATH AND CORONARY ANGIOGRAPHY N/A 03/12/2019   Procedure: RIGHT/LEFT HEART CATH AND CORONARY ANGIOGRAPHY;  Surgeon: Lucendia Rusk, MD;  Location: [facility] INVASIVE CV LAB;  Service: Cardiovascular;  Laterality: N/A;   SHOULDER ARTHROSCOPY WITH ROTATOR CUFF REPAIR AND SUBACROMIAL DECOMPRESSION Right 08/26/2020   Procedure: RIGHT SHOULDER MINI OPEN ROTATOR CUFF REPAIR AND SUBACROMIAL DECOMPRESSION WITH PATCH GRAFT;  Surgeon: Orvan Blanch, MD;  Location: [facility];  Service: Orthopedics;  Laterality: Right;  90 MINS GENERAL WITH BLOCK   SKIN SPLIT GRAFT Right 06/28/2021   Procedure: SKIN GRAFT SPLIT THICKNESS;  Surgeon: Barb Bonito, MD;  Location: [facility];  Service: Plastics;  Laterality: Right;   TEE WITHOUT CARDIOVERSION N/A 03/25/2019   Procedure: TRANSESOPHAGEAL ECHOCARDIOGRAM (TEE);  Surgeon: Hilarie Lovely, MD;  Location: [facility] OR;  Service: Open Heart Surgery;  Laterality: N/A;   Family History  Problem Relation Age of Onset   Colon cancer Mother    Heart Problems Father    Diabetes Father    Valvular heart disease Father    Sleep apnea Neg Hx    Stroke Neg Hx    Social History:  reports that he quit smoking about 35 years ago. His smoking use included cigarettes. He has been exposed to tobacco smoke. He has never used smokeless tobacco. He reports that  he does not currently use alcohol after a past usage of about 2.0 standard drinks of alcohol per week. He reports that he does not use drugs. Allergies  Allergen Reactions   Bee Venom Anaphylaxis   Atorvastatin  Other (See Comments)    myalgia   Diltiazem  Itching   Lopressor  [Metoprolol ]     NIGHTMARES. Pt tolerating this now 11/25/22   Oxycodone  Other (See Comments)    Reaction not listed on MAR    Reglan  [Metoclopramide ] Other (See Comments)    Suicidal    Rosuvastatin  Other (See Comments)     myalgias   Valsartan  Itching   Zyrtec [Cetirizine] Other (See Comments)    intolerance   Prior to Admission medications   Medication Sig Start Date End Date Taking? Authorizing Provider  amiodarone  (PACERONE ) 200 MG tablet Take 1 tablet (200 mg total) by mouth 2 (two) times daily. 02/16/23  Yes Ghimire, Estil Heman, MD  calcium  acetate (PHOSLO ) 667 MG capsule Take 667 mg by mouth See admin instructions. Take 667 mg by mouth twice daily on Monday, Wednesday, and Friday   Yes [provider]  Calcium  Acetate 667 MG TABS Take 667 mg by mouth See admin instructions. Take 667 mg by mouth three times daily on Tuesday, Thursday, Saturday, and Sunday   Yes [provider]  carboxymethylcellulose (REFRESH PLUS) 0.5 % SOLN Place into both eyes every 2 (two) hours as needed (for dry eyes).   Yes [provider]  Evolocumab  (REPATHA  SURECLICK) 140 MG/ML SOAJ Inject 140 mg into the skin every 14 (fourteen) days. 06/29/22  Yes Clearnce Curia, NP  fentaNYL  (DURAGESIC ) 25 MCG/HR Place 1 patch onto the skin every 3 (three) days. 02/16/23  Yes Ghimire, Estil Heman, MD  FIASP  FLEXTOUCH 100 UNIT/ML FlexTouch Pen Inject 0-15 Units into the skin See admin instructions. Per sliding scale: 0-120=0 notify NP/MD if BS is less than 70; 121-150= 2 units 151-200=3 units 201-250=5 units 251-300=8 units 301-350=11 units 351-400= 15 units and notify NP/MD for BS greater than 400 06/07/23  Yes [provider]  gabapentin (NEURONTIN) 100 MG capsule Take 100 mg by mouth at bedtime.   Yes [provider]  hydrALAZINE  (APRESOLINE ) 25 MG tablet Take 1 tablet (25 mg total) by mouth every 8 (eight) hours. Patient taking differently: Take 25 mg by mouth 3 (three) times daily. 02/16/23  Yes Ghimire, Estil Heman, MD  HYDROmorphone  HCl (DILAUDID ) 1 MG/ML LIQD Take 2 mg by mouth every 6 (six) hours as needed for severe pain (pain score 7-10). 06/10/23  Yes [provider]   ipratropium-albuterol  (DUONEB) 0.5-2.5 (3) MG/3ML SOLN Inhale 3 mLs into the lungs in the morning and at bedtime. 06/21/23  Yes [provider]  isosorbide  mononitrate (IMDUR ) 30 MG 24 hr tablet Take 1 tablet (30 mg total) by mouth daily. 02/16/23  Yes Ghimire, Estil Heman, MD  levothyroxine  (SYNTHROID ) 75 MCG tablet Take 37.5 mcg by mouth daily.   Yes [provider]  loratadine (CLARITIN) 10 MG tablet Take 10 mg by mouth See admin instructions. Every 2 days   Yes [provider]  melatonin 3 MG TABS tablet Take 3 mg by mouth at bedtime.   Yes [provider]  metoprolol  succinate (TOPROL -XL) 50 MG 24 hr tablet Take 1 tablet (50 mg total) by mouth daily. Take with or immediately following a meal. 02/16/23  Yes Ghimire, Estil Heman, MD  Nutritional Supplements (ENSURE ORIGINAL) LIQD Take 1 Dose by mouth 3 (three) times daily with meals.  Yes [provider]  ondansetron  (ZOFRAN ) 4 MG/2ML SOLN injection Inject 4 mg into the muscle every 8 (eight) hours as needed for refractory nausea / vomiting.   Yes [provider]  OXYGEN Inhale 2 L into the lungs continuous.   Yes [provider]  pantoprazole  (PROTONIX ) 40 MG tablet Take 1 tablet (40 mg total) by mouth 2 (two) times daily. 05/30/22  Yes Tat, Myrtie Atkinson, MD  polyethylene glycol (MIRALAX ) 17 g packet Take 17 g by mouth daily. 11/29/22  Yes Mabe, Doroteo Gasmen, MD  sertraline  (ZOLOFT ) 100 MG tablet Take 1 tablet (100 mg total) by mouth daily. 11/29/22  Yes Mabe, Doroteo Gasmen, MD  cefTRIAXone  (ROCEPHIN ) 2 g injection Inject 2 g into the muscle daily. For 5 days 06/21/23   [provider]  EPINEPHrine  0.3 mg/0.3 mL IJ SOAJ injection Inject 0.3 mg into the muscle as needed for anaphylaxis. Patient not taking: Reported on 06/27/2023 01/25/21   Bennet Brasil, MD  insulin  lispro (HUMALOG KWIKPEN) 100 UNIT/ML KwikPen Inject 0-15 Units into the skin 3 (three) times daily. BS 0-120 inject 0 units BS 121-150 inject 2  units  BS 151-200 inject 3 units  BS 201-250 inject 5 units  BS 251-300 inject 8 units  BS 301-350 inject 11 units  BS 351-400 inject 15 units BS 401-999 inject 17 units and call provider Patient not taking: Reported on 06/27/2023    [provider]  methocarbamol  (ROBAXIN ) 500 MG tablet Take 500 mg by mouth 3 (three) times daily. Patient not taking: Reported on 06/27/2023 06/04/23   [provider]  UNABLE TO FIND Take 1 Dose by mouth in the morning and at bedtime. Med Name: Med plus 2.0 Patient not taking: Reported on 06/27/2023    [provider]   Current Facility-Administered Medications  Medication Dose Route Frequency Provider Last Rate Last Admin    stroke: early stages of recovery book   Does not apply Once Tu, Ching T, DO       acetaminophen  (TYLENOL ) tablet 650 mg  650 mg Oral Q4H PRN Tu, Ching T, DO       Or   acetaminophen  (TYLENOL ) 160 MG/5ML solution 650 mg  650 mg Per Tube Q4H PRN Tu, Ching T, DO       Or   acetaminophen  (TYLENOL ) suppository 650 mg  650 mg Rectal Q4H PRN Tu, Ching T, DO       amiodarone  (PACERONE ) tablet 200 mg  200 mg Oral BID Tu, Ching T, DO       aspirin  EC tablet 81 mg  81 mg Oral Daily Augustin Leber, MD       [START ON 06/29/2023] calcium  acetate (PHOSLO ) capsule 667 mg  667 mg Oral 3 times per day on Sunday Tuesday Thursday Saturday Jared Merle T, DO       And   calcium  acetate (PHOSLO ) capsule 667 mg  667 mg Oral 2 times per day on Monday Wednesday Friday Tu, Ching T, DO       cefTRIAXone  (ROCEPHIN ) 1 g in sodium chloride  0.9 % 100 mL IVPB  1 g Intravenous Q24H Lind Repine, MD   Stopped at 06/27/23 1850   Chlorhexidine  Gluconate Cloth 2 % PADS 6 each  6 each Topical Q0600 Elona Hal, PA-C       clopidogrel  (PLAVIX ) tablet 300 mg  300 mg Oral Once Augustin Leber, MD       clopidogrel  (PLAVIX ) tablet 75 mg  75 mg Oral Daily  Augustin Leber, MD       doxycycline  (VIBRAMYCIN ) 100 mg in  dextrose  5 % 250 mL IVPB  100 mg Intravenous Q12H Tu, Ching T, DO   Stopped at 06/28/23 0813   heparin  injection 5,000 Units  5,000 Units Subcutaneous Q8H Tu, Ching T, DO   5,000 Units at 06/28/23 1610   [START ON 06/29/2023] levothyroxine  (SYNTHROID ) tablet 50 mcg  50 mcg Oral Daily Pahwani, Ravi, MD       metoprolol  succinate (TOPROL -XL) 24 hr tablet 50 mg  50 mg Oral Daily Tu, Ching T, DO       morphine  (PF) 2 MG/ML injection 2 mg  2 mg Intravenous Q4H PRN Pahwani, Ravi, MD       pantoprazole  (PROTONIX ) EC tablet 40 mg  40 mg Oral BID Tu, Ching T, DO       senna-docusate (Senokot-S) tablet 1 tablet  1 tablet Oral QHS PRN Tu, Ching T, DO       sertraline  (ZOLOFT ) tablet 100 mg  100 mg Oral Daily Tu, Ching T, DO       sodium chloride  flush (NS) 0.9 % injection 3 mL  3 mL Intravenous Once Lind Repine, MD       Current Outpatient Medications  Medication Sig Dispense Refill   amiodarone  (PACERONE ) 200 MG tablet Take 1 tablet (200 mg total) by mouth 2 (two) times daily.     calcium  acetate (PHOSLO ) 667 MG capsule Take 667 mg by mouth See admin instructions. Take 667 mg by mouth twice daily on Monday, Wednesday, and Friday     Calcium  Acetate 667 MG TABS Take 667 mg by mouth See admin instructions. Take 667 mg by mouth three times daily on Tuesday, Thursday, Saturday, and Sunday     carboxymethylcellulose (REFRESH PLUS) 0.5 % SOLN Place into both eyes every 2 (two) hours as needed (for dry eyes).     Evolocumab  (REPATHA  SURECLICK) 140 MG/ML SOAJ Inject 140 mg into the skin every 14 (fourteen) days. 6 mL 1   fentaNYL  (DURAGESIC ) 25 MCG/HR Place 1 patch onto the skin every 3 (three) days. 3 patch 0   FIASP  FLEXTOUCH 100 UNIT/ML FlexTouch Pen Inject 0-15 Units into the skin See admin instructions. Per sliding scale: 0-120=0 notify NP/MD if BS is less than 70; 121-150= 2 units 151-200=3 units 201-250=5 units 251-300=8 units 301-350=11 units 351-400= 15 units and notify NP/MD for BS greater than  400     gabapentin (NEURONTIN) 100 MG capsule Take 100 mg by mouth at bedtime.     hydrALAZINE  (APRESOLINE ) 25 MG tablet Take 1 tablet (25 mg total) by mouth every 8 (eight) hours. (Patient taking differently: Take 25 mg by mouth 3 (three) times daily.)     HYDROmorphone  HCl (DILAUDID ) 1 MG/ML LIQD Take 2 mg by mouth every 6 (six) hours as needed for severe pain (pain score 7-10).     ipratropium-albuterol  (DUONEB) 0.5-2.5 (3) MG/3ML SOLN Inhale 3 mLs into the lungs in the morning and at bedtime.     isosorbide  mononitrate (IMDUR ) 30 MG 24 hr tablet Take 1 tablet (30 mg total) by mouth daily.     levothyroxine  (SYNTHROID ) 75 MCG tablet Take 37.5 mcg by mouth daily.     loratadine (CLARITIN) 10 MG tablet Take 10 mg by mouth See admin instructions. Every 2 days     melatonin 3 MG TABS tablet Take 3 mg by mouth at bedtime.     metoprolol  succinate (TOPROL -XL) 50 MG 24 hr tablet  Take 1 tablet (50 mg total) by mouth daily. Take with or immediately following a meal.     Nutritional Supplements (ENSURE ORIGINAL) LIQD Take 1 Dose by mouth 3 (three) times daily with meals.     ondansetron  (ZOFRAN ) 4 MG/2ML SOLN injection Inject 4 mg into the muscle every 8 (eight) hours as needed for refractory nausea / vomiting.     OXYGEN Inhale 2 L into the lungs continuous.     pantoprazole  (PROTONIX ) 40 MG tablet Take 1 tablet (40 mg total) by mouth 2 (two) times daily. 60 tablet 1   polyethylene glycol (MIRALAX ) 17 g packet Take 17 g by mouth daily. 14 each 0   sertraline  (ZOLOFT ) 100 MG tablet Take 1 tablet (100 mg total) by mouth daily. 30 tablet 0   cefTRIAXone  (ROCEPHIN ) 2 g injection Inject 2 g into the muscle daily. For 5 days     EPINEPHrine  0.3 mg/0.3 mL IJ SOAJ injection Inject 0.3 mg into the muscle as needed for anaphylaxis. (Patient not taking: Reported on 06/27/2023) 1 each 4   insulin  lispro (HUMALOG KWIKPEN) 100 UNIT/ML KwikPen Inject 0-15 Units into the skin 3 (three) times daily. BS 0-120 inject 0  units BS 121-150 inject 2 units  BS 151-200 inject 3 units  BS 201-250 inject 5 units  BS 251-300 inject 8 units  BS 301-350 inject 11 units  BS 351-400 inject 15 units BS 401-999 inject 17 units and call provider (Patient not taking: Reported on 06/27/2023)     methocarbamol  (ROBAXIN ) 500 MG tablet Take 500 mg by mouth 3 (three) times daily. (Patient not taking: Reported on 06/27/2023)     UNABLE TO FIND Take 1 Dose by mouth in the morning and at bedtime. Med Name: Med plus 2.0 (Patient not taking: Reported on 06/27/2023)       ROS: As per HPI otherwise negative.  Physical Exam: Vitals:   06/28/23 0400 06/28/23 0708 06/28/23 0729 06/28/23 1158  BP: (!) 151/70 (!) 162/93  (!) 175/81  Pulse: 63 66  69  Resp: 13 15  15   Temp:   98 F (36.7 C) 98.7 F (37.1 C)  TempSrc:   Oral   SpO2: 95% 94%  95%  Weight:      Height:         General: Lying in bed, unresponsive  Head: NCAT sclera not icteric MMM Neck: Supple. Lungs: Clear bilaterally. Normal WOB Heart: RRR, no murmur, rub, or gallop  Abdomen: soft non-tender no masses  Lower extremities:without edema  Neuro: Obtunded Psych:  Unresponsive Dialysis Access: L AVF   Labs: Basic Metabolic Panel: Recent Labs  Lab 06/27/23 1521 06/27/23 1540  NA 136 136  K 4.0 4.1  CL 97* 96*  CO2  --  25  GLUCOSE 102* 101*  BUN 38* 39*  CREATININE 5.80* 5.66*  CALCIUM   --  8.8*   Liver Function Tests: Recent Labs  Lab 06/27/23 1540  AST 11*  ALT 7  ALKPHOS 91  BILITOT 1.1  PROT 6.3*  ALBUMIN  2.7*   No results for input(s): "LIPASE", "AMYLASE" in the last 168 hours. No results for input(s): "AMMONIA" in the last 168 hours. CBC: Recent Labs  Lab 06/27/23 1521 06/27/23 1540  WBC  --  7.9  NEUTROABS  --  6.1  HGB 11.9* 10.7*  HCT 35.0* 36.3*  MCV  --  82.1  PLT  --  223   Cardiac Enzymes: No results for input(s): "CKTOTAL", "CKMB", "CKMBINDEX", "TROPONINI" in the last  168 hours. CBG: Recent Labs  Lab 06/27/23 1502  06/27/23 1706  GLUCAP 100* 112*   Iron  Studies: No results for input(s): "IRON ", "TIBC", "TRANSFERRIN", "FERRITIN" in the last 72 hours. Studies/Results: MR BRAIN WO CONTRAST Result Date: 06/27/2023 CLINICAL DATA:  Stroke, follow up EXAM: MRI HEAD WITHOUT CONTRAST TECHNIQUE: Multiplanar, multiecho pulse sequences of the brain and surrounding structures were obtained without intravenous contrast. COMPARISON:  CT Head 06/27/23 FINDINGS: Brain: There are multiple scattered foci of acute infarct in the bilateral cerebral hemispheres, including the right parietal lobe (series 5, image 89), in the left frontal lobe (series 5, image 93), right frontal lobe (series 5, image 79), and left parietal lobe (series 5, image 79). There are multiple scattered foci of microhemorrhage, predominantly in a central distribution. There is a background of moderate to severe chronic microvascular ischemic change. Vascular: Normal flow voids. Skull and upper cervical spine: Normal marrow signal. Sinuses/Orbits: Trace left mastoid effusion. No middle ear effusion. Paranasal sinuses are clear. Bilateral lens replacement. Orbits are otherwise unremarkable. Other: None. IMPRESSION: Multiple scattered foci of acute infarct in the bilateral cerebral hemispheres, as described above. No hemorrhage or mass effect. Given distribution, these are favored to be secondary to a central embolic etiology. Findings were paged to Dr. Alecia Ames on 06/27/23 at 6:29 PM. Electronically Signed   By: Clora Dane M.D.   On: 06/27/2023 18:29   DG Chest Port 1 View Result Date: 06/27/2023 CLINICAL DATA:  Shortness of breath. EXAM: PORTABLE CHEST 1 VIEW COMPARISON:  Chest radiograph dated May 31, 2023. FINDINGS: Mild cardiomegaly, unchanged. Prior median sternotomy and CABG. Small right pleural effusion with right basilar atelectasis and patchy opacities, similar to slightly increased compared to the prior exam. Streaky left basilar opacities. No  pneumothorax. No acute osseous abnormality. Overlapping telemetry wires. IMPRESSION: 1. Small right pleural effusion with right basilar atelectasis, similar to slightly increased compared to the prior exam. Patchy right basilar opacities may reflect atelectasis or infiltrate. 2. Streaky left basilar opacities could reflect atelectasis or infiltrate. Electronically Signed   By: Mannie Seek M.D.   On: 06/27/2023 16:26   CT ANGIO HEAD NECK W WO CM W PERF (CODE STROKE) Result Date: 06/27/2023 CLINICAL DATA:  Altered mental status, slurred speech, stroke suspected EXAM: CT ANGIOGRAPHY HEAD AND NECK CT PERFUSION BRAIN TECHNIQUE: Multidetector CT imaging of the head and neck was performed using the standard protocol during bolus administration of intravenous contrast. Multiplanar CT image reconstructions and MIPs were obtained to evaluate the vascular anatomy. Carotid stenosis measurements (when applicable) are obtained utilizing NASCET criteria, using the distal internal carotid diameter as the denominator. Multiphase CT imaging of the brain was performed following IV bolus contrast injection. Subsequent parametric perfusion maps were calculated using RAPID software. RADIATION DOSE REDUCTION: This exam was performed according to the departmental dose-optimization program which includes automated exposure control, adjustment of the mA and/or kV according to patient size and/or use of iterative reconstruction technique. CONTRAST:  OMNIPAQUE  IOHEXOL  350 MG/ML SOLN COMPARISON:  No prior CTA available, correlation is made with 06/27/2023 CT stroke code and MRA head 12/14/2020 FINDINGS: CT HEAD FINDINGS For noncontrast findings, please see same day CT head. CTA NECK FINDINGS Aortic arch: Standard branching. Imaged portion shows no evidence of aneurysm or dissection. No significant stenosis of the major arch vessel origins. Aortic atherosclerosis. Right carotid system: 60% stenosis in the proximal right ICA (series  6, image 209). No evidence of dissection. Left carotid system: No evidence of dissection, occlusion, or hemodynamically  significant stenosis (greater than 50%). Vertebral arteries: Severe stenosis at the origin of the right vertebral artery (series 6, image 284), with additional severe stenosis in the right V2 segment (series 6, image 210). The left vertebral artery is patent from its origin to the skull base without significant stenosis. No evidence of dissection. Skeleton: No acute osseous abnormality. Degenerative changes in the cervical spine. Other neck: No acute finding. Upper chest: Moderate right and trace left pleural effusions. Review of the MIP images confirms the above findings CTA HEAD FINDINGS Anterior circulation: Both internal carotid arteries are patent to the termini, with severe stenosis in the proximal right supraclinoid ICA (series 6, image 113) and moderate stenosis in left supraclinoid ICA (series 6, images 107-108). A1 segments patent, with mild stenosis in the distal right A1. Normal anterior communicating artery. Anterior cerebral arteries are patent to their distal aspects without significant stenosis. No M1 stenosis or occlusion. MCA branches perfused to their distal aspects without significant stenosis. Posterior circulation: Severe stenosis in the right V4 just past the dural reflection (series 6, image 166), with an additional segment of severe stenosis in the proximal to mid V4 (series 6, image [id number]), and complete occlusion of the mid to distal right V4 (series 6, images 140-144). The proximal stenosis in mid to distal occlusion appears similar to the prior MRA from 2022. The left vertebral artery is dominant and patent to the vertebrobasilar junction without significant stenosis. Posterior inferior cerebellar arteries patent proximally. Basilar patent to its distal aspect without significant stenosis. Superior cerebellar arteries patent proximally. Patent P1 segments. PCAs  perfused to their distal aspects without significant stenosis. The bilateral posterior communicating arteries are not visualized. Venous sinuses: As permitted by contrast timing, patent. Anatomic variants: None significant. No evidence of aneurysm or vascular malformation. Review of the MIP images confirms the above findings CT Brain Perfusion Findings: ASPECTS: 10 CBF (<30%) Volume: 0mL Perfusion (Tmax>6.0s) volume: 13mL Mismatch Volume: 13mL Infarction Location:No infarct core detected. Area of possible penumbra in the superior right cerebellum, favored to be artifactual. IMPRESSION: 1. 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 2. Severe stenosis in the proximal right supraclinoid ICA and moderate stenosis in the left supraclinoid ICA. 3. 60% stenosis in the proximal right ICA. 4. Severe stenosis at the origin of the right vertebral artery, with additional severe stenosis in the right V2 segment. 5. No infarct core detected. Area of possible penumbra in the superior right cerebellum, favored to be artifactual. 6. Moderate right and trace left pleural effusions. 7. Aortic atherosclerosis. Aortic Atherosclerosis (ICD10-I70.0). Imaging results were communicated on 06/27/2023 at 3:49 pm to provider Dr. Alecia Ames via secure text paging. Electronically Signed   By: Zoila Hines M.D.   On: 06/27/2023 15:49   CT HEAD CODE STROKE WO CONTRAST Result Date: 06/27/2023 CLINICAL DATA:  Code stroke.  Slurred speech EXAM: CT HEAD WITHOUT CONTRAST TECHNIQUE: Contiguous axial images were obtained from the base of the skull through the vertex without intravenous contrast. RADIATION DOSE REDUCTION: This exam was performed according to the departmental dose-optimization program which includes automated exposure control, adjustment of the mA and/or kV according to patient size and/or use of iterative reconstruction technique.  COMPARISON:  05/31/23 CT head. FINDINGS: Brain: No hemorrhage. No hydrocephalus. No extra-axial fluid collection. No CT evidence of an acute cortical infarct. No mass effect. No mass lesion. There is background of moderate chronic microvascular  ischemic change. Generalized volume loss. Vascular: No hyperdense vessel or unexpected calcification. Skull: Normal. Negative for fracture or focal lesion. Sinuses/Orbits: Middle ear or mastoid effusion. Paranasal sinuses are notable for frothy secretions in the right sphenoid sinus. Bilateral lens replacement. Orbits are otherwise unremarkable. Other: No ASPECTS ([place] Stroke Program Early CT Score): 10 IMPRESSION: No hemorrhage or CT evidence of an acute cortical infarct. Aspects is 10. Findings were paged to Dr. Alecia Ames on 06/27/23 at 3:21 PM. Electronically Signed   By: Clora Dane M.D.   On: 06/27/2023 15:21    Dialysis Orders:  [facility] MWF 4:00 400/600 EDW 75.3kg 2K/2.5Ca AVF Hep 5000 Last HD 1/10.  Mircera 200 q 2wks (last 06/16/23) Hectorol 1mg  TIW  Assessment/Plan: Acute CVA - multiple scattered infarcts on MRI. W/u in progress. Per neurology  Acute encephalopathy - likely multifactorial   ESRD -  HD MWF. Missed HD Monday. Plan for dialysis today but may be deferred due to high inpatient census.   Hypertension/volume  - Permissive HTN per neuro. No gross volume overload   Anemia  - Hgb 10.7. On ESA as outpatient   Metabolic bone disease -  Calcium  acceptable. Continue home meds when tolerating po  H/o SVT - on amiodarone   T2DM - per primary   Elona Hal PA-C [facility] 06/28/2023, 12:45 PM

## 2023-06-28 NOTE — Progress Notes (Signed)
 Received from ED into bed, after SBAR reviewed.

## 2023-06-28 NOTE — Progress Notes (Signed)
 OT Cancellation Note  Patient Details Name: PHONG MEADS MRN: [id number] DOB: [birth year]   Cancelled Treatment:    Reason Eval/Treat Not Completed:  (pt transferring to floor from ED, will follow)  Jonette Nestle 06/28/2023, 3:27 PM

## 2023-06-28 NOTE — Evaluation (Signed)
 Clinical/Bedside Swallow Evaluation Patient Details  Name: [name] MRN: [id number] Date of Birth: [birth year]  Today's Date: 06/28/2023 Time: SLP Start Time (ACUTE ONLY): 1159 SLP Stop Time (ACUTE ONLY): 1209 SLP Time Calculation (min) (ACUTE ONLY): 10 min  Past Medical History:  Past Medical History:  Diagnosis Date   Acute blood loss anemia 03/20/2021   Acute metabolic encephalopathy 03/03/2022   Acute on chronic respiratory failure with hypoxia (HCC) 02/28/2022   Anemia    Arthritis    hips shoulders   CAD (coronary artery disease) 03/03/2019   Cancer (HCC)    Esophageal cancer 2022   Complete rotator cuff tear 08/26/2020   Depression 05/31/2021   Esophagitis    ESRD on hemodialysis (HCC)    Fatigue 10/14/2020   gram neg rod Bacteremia 03/01/2022   Heart failure with mildly reduced ejection fraction (HFmrEF) (HCC)    Hip fracture (HCC) 09/09/2022   Hypertension    Myocardial infarction (HCC)    Sleep apnea    uses a bipap machine   Stroke (HCC) 12/14/2020   no last weakness or paralysis   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   Past Surgical History:  Past Surgical History:  Procedure Laterality Date   AV FISTULA PLACEMENT Left 12/21/2021   Procedure: LEFT ARM ARTERIOVENOUS (AV) FISTULA CREATION;  Surgeon: Mayo Speck, MD;  Location: [facility];  Service: Vascular;  Laterality: Left;   BIOPSY  06/27/2019   Procedure: BIOPSY;  Surgeon: Ruby Corporal, MD;  Location: [facility];  Service: Endoscopy;;  ascending colon polyp   BIOPSY  10/21/2020   Procedure: BIOPSY;  Surgeon: Ruby Corporal, MD;  Location: [facility];  Service: Endoscopy;;  esophageal,gastric polyp   BIOPSY  02/24/2021   Procedure: BIOPSY;  Surgeon: Ruby Corporal, MD;  [facility]: [facility];  Service: Endoscopy;;  distal and proximal esophageal biopsies    BIOPSY  05/30/2022   Procedure: BIOPSY;  Surgeon: Vinetta Greening, DO;  [facility];  Service: Endoscopy;;   CARDIAC SURGERY     CATARACT EXTRACTION     COLONOSCOPY N/A 06/27/2019   [name]:Diverticulosis in the entire examined colon. tubular adenoma in ascending colon, 4mm tubular adenoma in prox sigmoid. external hemorrhoids   CORONARY ARTERY BYPASS GRAFT N/A 03/25/2019   Procedure: CORONARY ARTERY BYPASS GRAFTING (CABG) X  4 USING LEFT INTERNAL MAMMARY ARTERY AND RIGHT SAPHENOUS VEIN GRAFTS;  Surgeon: Hilarie Lovely, MD;  Location: [facility];  Service: Open Heart Surgery;  Laterality: N/A;   ESOPHAGEAL BRUSHING  03/08/2022   Procedure: ESOPHAGEAL BRUSHING;  Surgeon: Vinetta Greening, DO;  [facility]: [facility];  Service: Endoscopy;;   ESOPHAGOGASTRODUODENOSCOPY (EGD) WITH PROPOFOL  N/A 10/21/2020   rehman:Normal hypopharynx.Normal proximal esophagus and mid esophagus.Esophageal mucosal changes consistent with long-segment Barrett's esophagus. (Focal low-grade dysplasia and atypia proximally) z line irregular 37 cm from incisors, 3 cm HH, single gastric polyp (fundic gland) normal duodenal bulb/second portion of duodenum, proximal margin of Barrett's at 34 cm   ESOPHAGOGASTRODUODENOSCOPY (EGD) WITH PROPOFOL  N/A 02/24/2021   Procedure: ESOPHAGOGASTRODUODENOSCOPY (EGD) WITH PROPOFOL ;  Surgeon: Ruby Corporal, MD;  [facility];  Service: Endoscopy;  Laterality: N/A;  1:35   ESOPHAGOGASTRODUODENOSCOPY (EGD) WITH PROPOFOL  N/A 03/21/2021   Procedure: ESOPHAGOGASTRODUODENOSCOPY (EGD) WITH PROPOFOL ;  Surgeon: Alvis Jourdain, MD;  Location: [facility] ENDOSCOPY;  Service: Endoscopy;  Laterality: N/A;   ESOPHAGOGASTRODUODENOSCOPY (EGD) WITH PROPOFOL  N/A 10/23/2021  Procedure: ESOPHAGOGASTRODUODENOSCOPY (EGD) WITH PROPOFOL ;  Surgeon: Albertina Hugger, MD;  Location: [facility] ENDOSCOPY;  Service: Gastroenterology;  Laterality: N/A;   ESOPHAGOGASTRODUODENOSCOPY (EGD) WITH PROPOFOL  N/A 03/08/2022   Procedure: ESOPHAGOGASTRODUODENOSCOPY (EGD) WITH  PROPOFOL ;  Surgeon: Vinetta Greening, DO;  [facility];  Service: Endoscopy;  Laterality: N/A;   ESOPHAGOGASTRODUODENOSCOPY (EGD) WITH PROPOFOL  N/A 05/27/2022   Procedure: ESOPHAGOGASTRODUODENOSCOPY (EGD) WITH PROPOFOL ;  Surgeon: Urban Garden, MD;  [facility];  Service: Gastroenterology;  Laterality: N/A;   ESOPHAGOGASTRODUODENOSCOPY (EGD) WITH PROPOFOL  N/A 05/30/2022   Procedure: ESOPHAGOGASTRODUODENOSCOPY (EGD) WITH PROPOFOL ;  Surgeon: Vinetta Greening, DO;  [facility];  Service: Endoscopy;  Laterality: N/A;   EXCISION MORTON'S NEUROMA     EYE SURGERY Left    retina   INCISION AND DRAINAGE OF WOUND Right 06/28/2021   Procedure: IRRIGATION AND DEBRIDEMENT WOUND;  Surgeon: Barb Bonito, MD;  Location: [facility];  Service: Plastics;  Laterality: Right;  1.5 hour   INSERTION OF DIALYSIS CATHETER Right 12/21/2021   Procedure: INSERTION OF TUNNELED DIALYSIS CATHETER;  Surgeon: Mayo Speck, MD;  Location: [facility];  Service: Vascular;  Laterality: Right;   INTRAMEDULLARY (IM) NAIL INTERTROCHANTERIC Right 09/10/2022   Procedure: INTRAMEDULLARY (IM) NAIL INTERTROCHANTERIC;  Surgeon: Murleen Arms, MD;  Location: [facility];  Service: Orthopedics;  Laterality: Right;   LEFT HEART CATH AND CORS/GRAFTS ANGIOGRAPHY N/A 02/01/2022   Procedure: LEFT HEART CATH AND CORS/GRAFTS ANGIOGRAPHY;  Surgeon: Swaz[place], Peter M, MD;  Location: [name] INVASIVE CV LAB;  Service: Cardiovascular;  Laterality: N/A;   POLYPECTOMY  06/27/2019   Procedure: POLYPECTOMY;  Surgeon: Ruby Corporal, MD;  [facility];  Service: Endoscopy;;  proximal sigmoid colon   RIGHT/LEFT HEART CATH AND CORONARY ANGIOGRAPHY N/A 03/12/2019   Procedure: RIGHT/LEFT HEART CATH AND CORONARY ANGIOGRAPHY;  Surgeon: Lucendia Rusk, MD;  Location: [facility] INVASIVE CV LAB;  Service: Cardiovascular;  Laterality: N/A;   SHOULDER ARTHROSCOPY WITH ROTATOR CUFF REPAIR AND SUBACROMIAL DECOMPRESSION Right  08/26/2020   Procedure: RIGHT SHOULDER MINI OPEN ROTATOR CUFF REPAIR AND SUBACROMIAL DECOMPRESSION WITH PATCH GRAFT;  Surgeon: Orvan Blanch, MD;  Location: [facility];  Service: Orthopedics;  Laterality: Right;  90 MINS GENERAL WITH BLOCK   SKIN SPLIT GRAFT Right 06/28/2021   Procedure: SKIN GRAFT SPLIT THICKNESS;  Surgeon: Barb Bonito, MD;  Location: [facility];  Service: Plastics;  Laterality: Right;   TEE WITHOUT CARDIOVERSION N/A 03/25/2019   Procedure: TRANSESOPHAGEAL ECHOCARDIOGRAM (TEE);  Surgeon: Hilarie Lovely, MD;  [facility] OR;  Service: Open Heart Surgery;  Laterality: N/A;   HPI:  Pt is a 68 yo male presenting with slurred speech and AMS. MRI revealed multiple scattered foci of acute infarct in the bilateral cerebral hemispheres. Pt has been seen multiple times by SLP in the past with dysphagia suspected to be primarily of esophageal origin. He has been on pureed diets up to mechanical soft diets, always with thin liquids. PMH includes: recent PNA (dx 1/7), stroke (2022), esophageal ca (2022), HH, CAD s/p CABG, combined CHF, ESRD, DMII, HTN, SVT, OSA, UGIB. Pt bed and w/c bound since hip fx last April and now resides at [facility].    Assessment / Plan / Recommendation  Clinical Impression  Pt has a h/o esophageal dysphagia with recent diagnosis of PNA, and currently presents with altered mentation that limits PO intake. He is alert but inconsistently follows commands and answers questions. He did take a few small sips and a  small bite of puree. There were no overt s/s of aspiration, but he did have multiple swallows and he stopped taking any POs from SLP. It will likely be challenging to get an adequate amount of PO intake or consistent medication administration. Would consider offering small amounts of ice or single sips of water  with staff, after oral care, if alert and accepting.  SLP Visit Diagnosis: Dysphagia, unspecified (R13.10)    Aspiration Risk  Moderate aspiration risk;Risk  for inadequate nutrition/hydration    Diet Recommendation NPO;Ice chips PRN after oral care    Medication Administration: Via alternative means    Other  Recommendations Oral Care Recommendations: Oral care QID    Recommendations for follow up therapy are one component of a multi-disciplinary discharge planning process, led by the attending physician.  Recommendations may be updated based on patient status, additional functional criteria and insurance authorization.  Follow up Recommendations Skilled nursing-short term rehab (<3 hours/day)      Assistance Recommended at Discharge    Functional Status Assessment Patient has had a recent decline in their functional status and demonstrates the ability to make significant improvements in function in a reasonable and predictable amount of time.  Frequency and Duration min 2x/week  2 weeks       Prognosis Prognosis for improved oropharyngeal function: Fair Barriers to Reach Goals: Cognitive deficits;Language deficits;Time post onset      Swallow Study   General HPI: Pt is a 68 yo male presenting with slurred speech and AMS. MRI revealed multiple scattered foci of acute infarct in the bilateral cerebral hemispheres. Pt has been seen multiple times by SLP in the past with dysphagia suspected to be primarily of esophageal origin. He has been on pureed diets up to mechanical soft diets, always with thin liquids. PMH includes: recent PNA (dx 1/7), stroke (2022), esophageal ca (2022), HH, CAD s/p CABG, combined CHF, ESRD, DMII, HTN, SVT, OSA, UGIB. Pt bed and w/c bound since hip fx last April and now resides at [facility]. Type of Study: Bedside Swallow Evaluation Previous Swallow Assessment: see HPI Diet Prior to this Study: NPO Temperature Spikes Noted: No Respiratory Status: Room air History of Recent Intubation: No Behavior/Cognition: Alert;Cooperative;Requires cueing Oral Cavity Assessment: Within Functional Limits Oral Care Completed by SLP:  No Oral Cavity - Dentition: Adequate natural dentition Self-Feeding Abilities: Total assist Patient Positioning: Postural control adequate for testing Baseline Vocal Quality: Normal Volitional Cough: Cognitively unable to elicit Volitional Swallow: Unable to elicit    Oral/Motor/Sensory Function Overall Oral Motor/Sensory Function:  (not following commands well to assess - subtle R facial droop noted though)   Ice Chips Ice chips: Within functional limits Presentation: Spoon   Thin Liquid Thin Liquid: Impaired Presentation: Spoon;Straw Pharyngeal  Phase Impairments: Multiple swallows    Nectar Thick Nectar Thick Liquid: Not tested   Honey Thick Honey Thick Liquid: Not tested   Puree Puree: Impaired Presentation: Spoon Oral Phase Impairments: Poor awareness of bolus Pharyngeal Phase Impairments: Multiple swallows   Solid     Solid: Not tested      Beth Brooke., M.A. CCC-SLP Acute Rehabilitation Services Office [phone]  Secure chat preferred  06/28/2023,12:56 PM

## 2023-06-28 NOTE — Progress Notes (Addendum)
 PROGRESS NOTE    [name]  BJY:[id number] DOB: [birth year] DOA: 06/27/2023 PCP: Bennet Brasil, MD   Brief Narrative:  HPI: [name] is a 68 y.o. male with medical history significant of CAD s/p CABG, combined CHF, ESRD MWF, type 2 diabetes, hypertension, SVT, OSA, upper GI bleed who presented with slurred speech and AMS.   Ex-wife who is his caretaker provides history at bedside. Pt was sedated after receiving IV ativan  for MRI.  Pt moved into SNF about 9 months ago after a hospitalization. He was living along prior to that. He fracture his right hip last April and had declined and is now mostly bedbound and wheelchair bound. Caretaker last saw him well about 6 days ago on 1/9. He was diagnosed with right sided pneumonia on 1/7. Last dialysis session was Thursday 1/9. Normally schedule is MWF but he reportedly refused it yesterday which is unlike him per caretaker.    On arrival to the ED, he had temperature of 97.17F, bradycardic with heart rate of 58, elevated BP of 153/64.   He presented as a code stroke and was reported to be disoriented to month, not following commands, bilateral arm weakness, bilateral leg weakness, aphasia, dysarthria on exam.    CT head negative. 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  Severe stenosis in the proximal right super clinoid ICA and moderate stenosis in the left supraclinoid ICA.  60% stenosis in the proximal right ICA severe stenosis at the origin of the right vertebral artery with additional severe stenosis in the right V2 segment.   Chest x-ray demonstrated small right pleural effusion with patchy opacity.  There is also streaky left basilar opacity.   CBC without leukocytosis, anemia stable with hemoglobin of 10.7.   BMP with creatinine of 5.66 with his previous ranging around 3-5   TSH elevated at 11.6 but was normal in September.  Assessment &  Plan:   Principal Problem:   Stroke [facility]) Active Problems:   Chronic combined systolic and diastolic CHF (congestive heart failure) (HCC)   HTN (hypertension)   Pleural effusion   Type 2 diabetes mellitus (HCC)   Obstructive sleep apnea   S/P CABG (coronary artery bypass graft)   ESRD (end stage renal disease) on dialysis (HCC)   Abnormal TSH   History of supraventricular tachycardia  Bilateral acute ischemic stroke (HCC) Pt presented with aphasia, generalized weakness from SNF -CTA head and neck demonstrated numerous areas of stenosis including 60% stenosis in the proximal right ICA. MRI brain shows Multiple scattered foci of acute infarct in the bilateral cerebral Hemispheres.  Patient started on aspirin  and Plavix , hemoglobin A1c and lipid pending.  Neurology following.  PT OT SLP on board.  Still n.p.o., has not passed bedside swallow.  Possible aspiration pneumonia/right pleural effusion which is trivial/possible UTI?: Chest x-ray shows right pleural effusion and atelectasis and patchy right basilar opacity/infiltrate.  Patient does not endorse any respiratory symptoms but he is encephalopathy as well.  No leukocytosis and he is afebrile but procalcitonin slightly elevated.  Cannot rule out aspiration pneumonia clinically.  Will continue Rocephin  and Zithromax .   Chronic systolic congestive heart failure: Latest echo done in September 2024 shows 35 to 40% ejection fraction.  Patient appears euvolemic.   HTN (hypertension) -allow for permissive HTN through the morning of 06/29/2023.  Will discontinue Imdur  but continue Toprol -XL.   Type 2 diabetes mellitus (HCC) -  Uncontrolled.  Hemoglobin A1c of 8.1 in March.  Start on SSI.   Obstructive sleep apnea -not on CPAP but has nighttime O2 supplement   History of supraventricular tachycardia - Continue amiodarone  and beta-blocker   Abnormal TSH -TSH elevated to 11 but was just normal in September.  On Synthroid  37.5 mcg, will  increase to 50 mcg.   ESRD (end stage renal disease) on dialysis [facility]) -scheduled MWF. Last session on Thursday.  Consulted nephrology today.  May need dialysis today.   S/P CABG (coronary artery bypass graft) -continue aspirin    Goal of care: Patient was already at the nursing home and mostly bedbound due to previous strokes and multiple comorbidities, now has incomprehensible speech at times and unable to move extremities at all.  Carries very poor prognosis.  I have consulted palliative care to have goal of care conversation with next of kin.  DVT prophylaxis: heparin  injection 5,000 Units Start: 06/27/23 2200   Code Status: Limited: Do not attempt resuscitation (DNR) -DNR-LIMITED -Do Not Intubate/DNI   Family Communication:  None present at bedside.    Status is: Observation The patient will require care spanning > 2 midnights and should be moved to inpatient because: Will need to be seen by neurology, PT OT   Estimated body mass index is 25.83 kg/m as calculated from the following:   Height as of this encounter: 5\' 10"  (1.778 m).   Weight as of this encounter: 81.6 kg.    Nutritional Assessment: Body mass index is 25.83 kg/m.[name] Seen by dietician.  I agree with the assessment and plan as outlined below: Nutrition Status:        . Skin Assessment: I have examined the patient's skin and I agree with the wound assessment as performed by the wound care RN as outlined below:    Consultants:  Neurology  Procedures:  None  Antimicrobials:  Anti-infectives (From admission, onward)    Start     Dose/Rate Route Frequency Ordered Stop   06/28/23 0700  doxycycline  (VIBRAMYCIN ) 100 mg in dextrose  5 % 250 mL IVPB        100 mg 125 mL/hr over 120 Minutes Intravenous Every 12 hours 06/27/23 2028     06/27/23 1700  azithromycin  (ZITHROMAX ) 500 mg in sodium chloride  0.9 % 250 mL IVPB  Status:  Discontinued        500 mg 250 mL/hr over 60 Minutes Intravenous Every 24 hours  06/27/23 1650 06/27/23 2028   06/27/23 1700  cefTRIAXone  (ROCEPHIN ) 1 g in sodium chloride  0.9 % 100 mL IVPB        1 g 200 mL/hr over 30 Minutes Intravenous Every 24 hours 06/27/23 1650           Subjective: Patient seen and examined in the ED.  Patient with slow response.  He was able to tell me his name and date of birth.  At times, he has slurred speech which is incomprehensible.  At times he is following commands but at times he is not.  Unable to lift any of his extremities or squeeze my fingers in any of his hands.  Does appear to have slight left facial droop.  Objective: Vitals:   06/28/23 0237 06/28/23 0400 06/28/23 0708 06/28/23 0729  BP: (!) 152/67 (!) 151/70 (!) 162/93   Pulse: 63 63 66   Resp: 12 13 15    Temp:    98 F (36.7 C)  TempSrc:    Oral  SpO2: 94% 95% 94%  Weight:      Height:       No intake or output data in the 24 hours ending 06/28/23 0803 Filed Weights   06/27/23 1500 06/27/23 1551  Weight: 77.6 kg 81.6 kg    Examination:  General exam: Appears calm and comfortable  Respiratory system: Clear to auscultation.  Will not take deep breaths on asking. Cardiovascular system: S1 & S2 heard, RRR. No JVD, murmurs, rubs, gallops or clicks. No pedal edema. Gastrointestinal system: Abdomen is nondistended, soft and nontender. No organomegaly or masses felt. Normal bowel sounds heard. Central nervous system: Alert and oriented x 2.  Appears to have 1/4 power in bilateral upper extremities and 0 power in bilateral lower extremities.  Dysarthria.  Left facial droop. Skin: No rashes, lesions or ulcers   Data Reviewed: I have personally reviewed following labs and imaging studies  CBC: Recent Labs  Lab 06/27/23 1521 06/27/23 1540  WBC  --  7.9  NEUTROABS  --  6.1  HGB 11.9* 10.7*  HCT 35.0* 36.3*  MCV  --  82.1  PLT  --  223   Basic Metabolic Panel: Recent Labs  Lab 06/27/23 1521 06/27/23 1540  NA 136 136  K 4.0 4.1  CL 97* 96*  CO2  --  25   GLUCOSE 102* 101*  BUN 38* 39*  CREATININE 5.80* 5.66*  CALCIUM   --  8.8*   GFR: Estimated Creatinine Clearance: 13.3 mL/min (A) (by C-G formula based on SCr of 5.66 mg/dL (H)). Liver Function Tests: Recent Labs  Lab 06/27/23 1540  AST 11*  ALT 7  ALKPHOS 91  BILITOT 1.1  PROT 6.3*  ALBUMIN  2.7*   No results for input(s): "LIPASE", "AMYLASE" in the last 168 hours. No results for input(s): "AMMONIA" in the last 168 hours. Coagulation Profile: Recent Labs  Lab 06/27/23 1540  INR 1.1   Cardiac Enzymes: No results for input(s): "CKTOTAL", "CKMB", "CKMBINDEX", "TROPONINI" in the last 168 hours. BNP (last 3 results) No results for input(s): "PROBNP" in the last 8760 hours. HbA1C: No results for input(s): "HGBA1C" in the last 72 hours. CBG: Recent Labs  Lab 06/27/23 1502 06/27/23 1706  GLUCAP 100* 112*   Lipid Profile: No results for input(s): "CHOL", "HDL", "LDLCALC", "TRIG", "CHOLHDL", "LDLDIRECT" in the last 72 hours. Thyroid  Function Tests: Recent Labs    06/27/23 1648  TSH 11.664*   Anemia Panel: No results for input(s): "VITAMINB12", "FOLATE", "FERRITIN", "TIBC", "IRON ", "RETICCTPCT" in the last 72 hours. Sepsis Labs: Recent Labs  Lab 06/27/23 1548 06/27/23 1902 06/27/23 1919  PROCALCITON  --   --  0.35  LATICACIDVEN 1.3 1.1  --     Recent Results (from the past 240 hours)  Culture, blood (Routine X 2) w Reflex to ID Panel     Status: None (Preliminary result)   Collection Time: 06/27/23  4:48 PM   Specimen: BLOOD RIGHT ARM  Result Value Ref Range Status   Specimen Description BLOOD RIGHT ARM  Final   Special Requests   Final    BOTTLES DRAWN AEROBIC AND ANAEROBIC Blood Culture results may not be optimal due to an inadequate volume of blood received in culture bottles   Culture   Final    NO GROWTH < 24 HOURS Performed at [facility], [address] [place], [place] [address]    Report Status PENDING  Incomplete  Culture, blood (Routine  X 2) w Reflex to ID Panel     Status: None (Preliminary result)  Collection Time: 06/27/23  6:57 PM   Specimen: BLOOD RIGHT HAND  Result Value Ref Range Status   Specimen Description BLOOD RIGHT HAND  Final   Special Requests   Final    BOTTLES DRAWN AEROBIC ONLY Blood Culture results may not be optimal due to an inadequate volume of blood received in culture bottles   Culture   Final    NO GROWTH < 12 HOURS Performed at [facility], [address] [place], [place] [address]    Report Status PENDING  Incomplete  Resp panel by RT-PCR (RSV, Flu A&B, Covid) Anterior Nasal Swab     Status: None   Collection Time: 06/27/23  8:00 PM   Specimen: Anterior Nasal Swab  Result Value Ref Range Status   SARS Coronavirus 2 by RT PCR NEGATIVE NEGATIVE Final   Influenza A by PCR NEGATIVE NEGATIVE Final   Influenza B by PCR NEGATIVE NEGATIVE Final    Comment: (NOTE) The Xpert Xpress SARS-CoV-2/FLU/RSV plus assay is intended as an aid in the diagnosis of influenza from Nasopharyngeal swab specimens and should not be used as a sole basis for treatment. Nasal washings and aspirates are unacceptable for Xpert Xpress SARS-CoV-2/FLU/RSV testing.  Fact Sheet for Patients: BloggerCourse.com  Fact Sheet for Healthcare Providers: SeriousBroker.it  This test is not yet approved or cleared by the [place]  FDA and has been authorized for detection and/or diagnosis of SARS-CoV-2 by FDA under an Emergency Use Authorization (EUA). This EUA will remain in effect (meaning this test can be used) for the duration of the COVID-19 declaration under Section 564(b)(1) of the Act, 21 U.S.C. section 360bbb-3(b)(1), unless the authorization is terminated or revoked.     Resp Syncytial Virus by PCR NEGATIVE NEGATIVE Final    Comment: (NOTE) Fact Sheet for Patients: BloggerCourse.com  Fact Sheet for Healthcare  Providers: SeriousBroker.it  This test is not yet approved or cleared by the [place]  FDA and has been authorized for detection and/or diagnosis of SARS-CoV-2 by FDA under an Emergency Use Authorization (EUA). This EUA will remain in effect (meaning this test can be used) for the duration of the COVID-19 declaration under Section 564(b)(1) of the Act, 21 U.S.C. section 360bbb-3(b)(1), unless the authorization is terminated or revoked.  Performed at [facility], [address], [place], [place] [address]      Radiology Studies: MR BRAIN WO CONTRAST Result Date: 06/27/2023 CLINICAL DATA:  Stroke, follow up EXAM: MRI HEAD WITHOUT CONTRAST TECHNIQUE: Multiplanar, multiecho pulse sequences of the brain and surrounding structures were obtained without intravenous contrast. COMPARISON:  CT Head 06/27/23 FINDINGS: Brain: There are multiple scattered foci of acute infarct in the bilateral cerebral hemispheres, including the right parietal lobe (series 5, image 89), in the left frontal lobe (series 5, image 93), right frontal lobe (series 5, image 79), and left parietal lobe (series 5, image 79). There are multiple scattered foci of microhemorrhage, predominantly in a central distribution. There is a background of moderate to severe chronic microvascular ischemic change. Vascular: Normal flow voids. Skull and upper cervical spine: Normal marrow signal. Sinuses/Orbits: Trace left mastoid effusion. No middle ear effusion. Paranasal sinuses are clear. Bilateral lens replacement. Orbits are otherwise unremarkable. Other: None. IMPRESSION: Multiple scattered foci of acute infarct in the bilateral cerebral hemispheres, as described above. No hemorrhage or mass effect. Given distribution, these are favored to be secondary to a central embolic etiology. Findings were paged to Dr. Alecia Ames on 06/27/23 at 6:29 PM. Electronically Signed  By: Hemant  Desai M.D.   On: 06/27/2023  18:29   DG Chest Port 1 View Result Date: 06/27/2023 CLINICAL DATA:  Shortness of breath. EXAM: PORTABLE CHEST 1 VIEW COMPARISON:  Chest radiograph dated May 31, 2023. FINDINGS: Mild cardiomegaly, unchanged. Prior median sternotomy and CABG. Small right pleural effusion with right basilar atelectasis and patchy opacities, similar to slightly increased compared to the prior exam. Streaky left basilar opacities. No pneumothorax. No acute osseous abnormality. Overlapping telemetry wires. IMPRESSION: 1. Small right pleural effusion with right basilar atelectasis, similar to slightly increased compared to the prior exam. Patchy right basilar opacities may reflect atelectasis or infiltrate. 2. Streaky left basilar opacities could reflect atelectasis or infiltrate. Electronically Signed   By: Mannie Seek M.D.   On: 06/27/2023 16:26   CT ANGIO HEAD NECK W WO CM W PERF (CODE STROKE) Result Date: 06/27/2023 CLINICAL DATA:  Altered mental status, slurred speech, stroke suspected EXAM: CT ANGIOGRAPHY HEAD AND NECK CT PERFUSION BRAIN TECHNIQUE: Multidetector CT imaging of the head and neck was performed using the standard protocol during bolus administration of intravenous contrast. Multiplanar CT image reconstructions and MIPs were obtained to evaluate the vascular anatomy. Carotid stenosis measurements (when applicable) are obtained utilizing NASCET criteria, using the distal internal carotid diameter as the denominator. Multiphase CT imaging of the brain was performed following IV bolus contrast injection. Subsequent parametric perfusion maps were calculated using RAPID software. RADIATION DOSE REDUCTION: This exam was performed according to the departmental dose-optimization program which includes automated exposure control, adjustment of the mA and/or kV according to patient size and/or use of iterative reconstruction technique. CONTRAST:  OMNIPAQUE  IOHEXOL  350 MG/ML SOLN COMPARISON:  No prior CTA  available, correlation is made with 06/27/2023 CT stroke code and MRA head 12/14/2020 FINDINGS: CT HEAD FINDINGS For noncontrast findings, please see same day CT head. CTA NECK FINDINGS Aortic arch: Standard branching. Imaged portion shows no evidence of aneurysm or dissection. No significant stenosis of the major arch vessel origins. Aortic atherosclerosis. Right carotid system: 60% stenosis in the proximal right ICA (series 6, image 209). No evidence of dissection. Left carotid system: No evidence of dissection, occlusion, or hemodynamically significant stenosis (greater than 50%). Vertebral arteries: Severe stenosis at the origin of the right vertebral artery (series 6, image 284), with additional severe stenosis in the right V2 segment (series 6, image 210). The left vertebral artery is patent from its origin to the skull base without significant stenosis. No evidence of dissection. Skeleton: No acute osseous abnormality. Degenerative changes in the cervical spine. Other neck: No acute finding. Upper chest: Moderate right and trace left pleural effusions. Review of the MIP images confirms the above findings CTA HEAD FINDINGS Anterior circulation: Both internal carotid arteries are patent to the termini, with severe stenosis in the proximal right supraclinoid ICA (series 6, image 113) and moderate stenosis in left supraclinoid ICA (series 6, images 107-108). A1 segments patent, with mild stenosis in the distal right A1. Normal anterior communicating artery. Anterior cerebral arteries are patent to their distal aspects without significant stenosis. No M1 stenosis or occlusion. MCA branches perfused to their distal aspects without significant stenosis. Posterior circulation: Severe stenosis in the right V4 just past the dural reflection (series 6, image 166), with an additional segment of severe stenosis in the proximal to mid V4 (series 6, image 150-159), and complete occlusion of the mid to distal right V4  (series 6, images 140-144). The proximal stenosis in mid to distal occlusion appears similar  to the prior MRA from 2022. The left vertebral artery is dominant and patent to the vertebrobasilar junction without significant stenosis. Posterior inferior cerebellar arteries patent proximally. Basilar patent to its distal aspect without significant stenosis. Superior cerebellar arteries patent proximally. Patent P1 segments. PCAs perfused to their distal aspects without significant stenosis. The bilateral posterior communicating arteries are not visualized. Venous sinuses: As permitted by contrast timing, patent. Anatomic variants: None significant. No evidence of aneurysm or vascular malformation. Review of the MIP images confirms the above findings CT Brain Perfusion Findings: ASPECTS: 10 CBF (<30%) Volume: 0mL Perfusion (Tmax>6.0s) volume: 13mL Mismatch Volume: 13mL Infarction Location:No infarct core detected. Area of possible penumbra in the superior right cerebellum, favored to be artifactual. IMPRESSION: 1. 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 2. Severe stenosis in the proximal right supraclinoid ICA and moderate stenosis in the left supraclinoid ICA. 3. 60% stenosis in the proximal right ICA. 4. Severe stenosis at the origin of the right vertebral artery, with additional severe stenosis in the right V2 segment. 5. No infarct core detected. Area of possible penumbra in the superior right cerebellum, favored to be artifactual. 6. Moderate right and trace left pleural effusions. 7. Aortic atherosclerosis. Aortic Atherosclerosis (ICD10-I70.0). Imaging results were communicated on 06/27/2023 at 3:49 pm to provider Dr. Alecia Ames via secure text paging. Electronically Signed   By: Zoila Hines M.D.   On: 06/27/2023 15:49   CT HEAD CODE STROKE WO CONTRAST Result Date: 06/27/2023 CLINICAL DATA:  Code stroke.   Slurred speech EXAM: CT HEAD WITHOUT CONTRAST TECHNIQUE: Contiguous axial images were obtained from the base of the skull through the vertex without intravenous contrast. RADIATION DOSE REDUCTION: This exam was performed according to the departmental dose-optimization program which includes automated exposure control, adjustment of the mA and/or kV according to patient size and/or use of iterative reconstruction technique. COMPARISON:  05/31/23 CT head. FINDINGS: Brain: No hemorrhage. No hydrocephalus. No extra-axial fluid collection. No CT evidence of an acute cortical infarct. No mass effect. No mass lesion. There is background of moderate chronic microvascular ischemic change. Generalized volume loss. Vascular: No hyperdense vessel or unexpected calcification. Skull: Normal. Negative for fracture or focal lesion. Sinuses/Orbits: Middle ear or mastoid effusion. Paranasal sinuses are notable for frothy secretions in the right sphenoid sinus. Bilateral lens replacement. Orbits are otherwise unremarkable. Other: No ASPECTS ([place] Stroke Program Early CT Score): 10 IMPRESSION: No hemorrhage or CT evidence of an acute cortical infarct. Aspects is 10. Findings were paged to Dr. Alecia Ames on 06/27/23 at 3:21 PM. Electronically Signed   By: Clora Dane M.D.   On: 06/27/2023 15:21    Scheduled Meds:   stroke: early stages of recovery book   Does not apply Once   amiodarone   200 mg Oral BID   aspirin  EC  81 mg Oral Daily   [START ON 06/29/2023] calcium  acetate  667 mg Oral 3 times per day on Sunday Tuesday Thursday Saturday   And   calcium  acetate  667 mg Oral 2 times per day on Monday Wednesday Friday   clopidogrel   300 mg Oral Once   clopidogrel   75 mg Oral Daily   heparin   5,000 Units Subcutaneous Q8H   levothyroxine   37.5 mcg Oral Daily   metoprolol  succinate  50 mg Oral Daily   pantoprazole   40 mg Oral BID   sertraline   100 mg Oral Daily  sodium chloride  flush  3 mL Intravenous Once   Continuous  Infusions:  cefTRIAXone  (ROCEPHIN )  IV Stopped (06/27/23 1850)   doxycycline  (VIBRAMYCIN ) IV 100 mg (06/28/23 0613)     LOS: 0 days   Modena Andes, MD [facility]  06/28/2023, 8:03 AM   *Please note that this is a verbal dictation therefore any spelling or grammatical errors are due to the "Dragon Medical One" system interpretation.  Please page via Amion and do not message via secure chat for urgent patient care matters. Secure chat can be used for non urgent patient care matters.  How to contact the [facility] Attending or Consulting provider 7A - 7P or covering provider during after hours 7P -7A, for this patient?  Check the care team in [facility] and look for a) attending/consulting [facility] provider listed and b) the [facility] team listed. Page or secure chat 7A-7P. Log into [email or web address] and use [facility] universal password to access. If you do not have the password, please contact the hospital operator. Locate the [facility] provider you are looking for under [facility] and page to a number that you can be directly reached. If you still have difficulty reaching the provider, please page the [facility] (Director on Call) for the Hospitalists listed on amion for assistance.

## 2023-06-28 NOTE — Consult Note (Signed)
Consultation Note Date: 06/28/2023   Patient Name: [name]  DOB: [birth year]  MRN: [id number]  Age / Sex: 68 y.o., male  PCP: Babs Sciara, MD Referring Physician: Hughie Closs, MD  Reason for Consultation: Establishing goals of care  HPI/Patient Profile: 68 y.o. male  with past medical history of stroke, CAD s/p CABG, HFmrEF, ESRD on HD MWF, diabetes, HTN, SVT, OSA, upper GIB, esophageal cancer 2022, right eye vision loss, s/p hip fracture s/p repair admitted on 06/27/2023 with slurred speech and AMS due to stroke, uremia (missed HD), and new stroke.   Clinical Assessment and Goals of Care: Consult received and chart review completed. Noted multiple complications and overall functional decline especially since last summer.  [name] is lethargic. He awakens and does answer some questions appropriately but with delay. Sometimes he does not answer and sometimes I cannot understand his response. He does follow some simple commands but inconsistent.   I met today with [name] HCPOA/sister, [name] and significant other, [name]. We met away from bedside and reviewed Mr. Milad declining functional status and quality of life over the past months to year. They share that he liked to stay busy and was always up to travel and do something to get out and be active. [name] also shares that he also likes to sit and watch television too. 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   We discussed goals of care. They wish to see how he responds after dialysis and want to ensure that his mental decline is not primarily uremia. They understand that he is NPO and understand that if he does not improve then we are in a much worse place. We reviewed that dialysis and a feeding tube is not a good combination that provides a good quality of life.  [name] mentions that he has filled out a paper stating he would not want a feeding tube. He enjoyed cooking and food so they agree that he would likely not want feeding tube. We discussed if his swallowing does not improve the alternative of comfort care. They are concerned about placement as they have not been pleased with care at his facility. We discussed that we can talk with CSW about alternative options but knowing that options are likely very limited and may not be any better. We also discussed that if he transitions to full comfort care and dialysis is stopped that he would likely be eligible for hospice facility.   No decisions made today. Seems like [name] would not desire feeding tube. Time to see if he improves after dialysis. They are hopeful for improvement in swallowing after dialysis - if not we will further discuss and consider comfort care. Ongoing palliative conversations and support.   All questions/concerns addressed. Emotional support provided.   Primary Decision Maker HCPOA sister Amy    SUMMARY OF RECOMMENDATIONS   - DNR/DNI - Time for outcomes (hopeful for improvement after dialysis) -  He has stated before he would not want feeding tube - Ongoing palliative conversations for goals of care  Code Status/Advance Care Planning: DNR   Symptom Management:  Per attending, nephrology, stroke team  Prognosis:  Overall prognosis poor. Time for outcomes.   Discharge Planning: To Be Determined      Primary Diagnoses: Present on Admission:  Stroke [facility])  Chronic combined systolic and diastolic CHF (congestive heart failure) (HCC)  HTN (hypertension)  Obstructive sleep apnea  Pleural effusion  Acute ischemic stroke (HCC)   I have reviewed the medical record, interviewed the patient and family, and examined the patient. The following aspects are pertinent.  Past Medical History:  Diagnosis Date   Acute blood loss anemia 03/20/2021   Acute metabolic  encephalopathy 03/03/2022   Acute on chronic respiratory failure with hypoxia (HCC) 02/28/2022   Anemia    Arthritis    hips shoulders   CAD (coronary artery disease) 03/03/2019   Cancer (HCC)    Esophageal cancer 2022   Complete rotator cuff tear 08/26/2020   Depression 05/31/2021   Esophagitis    ESRD on hemodialysis (HCC)    Fatigue 10/14/2020   gram neg rod Bacteremia 03/01/2022   Heart failure with mildly reduced ejection fraction (HFmrEF) (HCC)    Hip fracture (HCC) 09/09/2022   Hypertension    Myocardial infarction (HCC)    Sleep apnea    uses a bipap machine   Stroke (HCC) 12/14/2020   no last weakness or paralysis   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   Social History   Socioeconomic History   Marital status: Single    Spouse name: Not on file   Number of children: 0   Years of education: 12   Highest education level: 12th grade  Occupational History   Not on file  Tobacco Use   Smoking status: Former    Current packs/day: 0.00    Types: Cigarettes    Quit date: 07/06/1988    Years since quitting: 35.0    Passive exposure: Past   Smokeless tobacco: Never  Vaping Use   Vaping status: Never Used  Substance and Sexual Activity   Alcohol use: Not Currently    Alcohol/week: 2.0 standard drinks of alcohol    Types: 2 Glasses of wine per week    Comment: socially   Drug use: No   Sexual activity: Not Currently    Partners: Female  Other Topics Concern   Not on file  Social History Narrative   Not on file   Social Drivers of Health   Financial Resource Strain: Low Risk  (05/26/2023)   Overall Financial Resource Strain (CARDIA)    Difficulty of Paying Living Expenses: Not hard at all  Food Insecurity: No Food Insecurity (05/26/2023)   Hunger Vital Sign    Worried About Running Out of Food in the Last Year: Never true    Ran Out of Food in the Last Year: Never true  Transportation  Needs: No Transportation Needs (05/26/2023)   PRAPARE - Administrator, Civil Service (Medical): No    Lack of Transportation (Non-Medical): No  Physical Activity: Sufficiently Active (05/26/2023)   Exercise Vital Sign    Days of Exercise per Week: 5 days    Minutes of Exercise per Session: 50 min  Stress: No Stress Concern Present (05/26/2023)   [employer] of Occupational Health - Occupational Stress Questionnaire    Feeling  of Stress : Only a little  Social Connections: Unknown (05/26/2023)   Social Connection and Isolation Panel [NHANES]    Frequency of Communication with Friends and Family: More than three times a week    Frequency of Social Gatherings with Friends and Family: More than three times a week    Attends Religious Services: More than 4 times per year    Active Member of [employer] or Organizations: No    Attends Engineer, structural: Never    Marital Status: Not on file   Family History  Problem Relation Age of Onset   Colon cancer Mother    Heart Problems Father    Diabetes Father    Valvular heart disease Father    Sleep apnea Neg Hx    Stroke Neg Hx    Scheduled Meds:   stroke: early stages of recovery book   Does not apply Once   amiodarone  200 mg Oral BID   aspirin EC  81 mg Oral Daily   [START ON 06/29/2023] calcium acetate  667 mg Oral 3 times per day on Sunday Tuesday Thursday Saturday   And   calcium acetate  667 mg Oral 2 times per day on Monday Wednesday Friday   clopidogrel  300 mg Oral Once   clopidogrel  75 mg Oral Daily   heparin  5,000 Units Subcutaneous Q8H   [START ON 06/29/2023] levothyroxine  50 mcg Oral Daily   metoprolol succinate  50 mg Oral Daily   pantoprazole  40 mg Oral BID   sertraline  100 mg Oral Daily   sodium chloride flush  3 mL Intravenous Once   Continuous Infusions:  cefTRIAXone (ROCEPHIN)  IV Stopped (06/27/23 1850)   doxycycline (VIBRAMYCIN) IV Stopped (06/28/23 0813)   PRN Meds:.acetaminophen  **OR** acetaminophen (TYLENOL) oral liquid 160 mg/5 mL **OR** acetaminophen, senna-docusate Allergies  Allergen Reactions   Bee Venom Anaphylaxis   Atorvastatin Other (See Comments)    myalgia   Diltiazem Itching   Lopressor [Metoprolol]     NIGHTMARES. Pt tolerating this now 11/25/22   Oxycodone Other (See Comments)    Reaction not listed on MAR    Reglan [Metoclopramide] Other (See Comments)    Suicidal    Rosuvastatin Other (See Comments)    myalgias   Valsartan Itching   Zyrtec [Cetirizine] Other (See Comments)    intolerance   Review of Systems  Unable to perform ROS: Acuity of condition    Physical Exam Vitals and nursing note reviewed.  Constitutional:      Appearance: He is ill-appearing.  Cardiovascular:     Rate and Rhythm: Normal rate.  Pulmonary:     Effort: No tachypnea, accessory muscle usage or respiratory distress.  Abdominal:     Palpations: Abdomen is soft.  Neurological:     Mental Status: He is lethargic.     Vital Signs: BP (!) 162/93 (BP Location: Right Arm)   Pulse 66   Temp 98 F (36.7 C) (Oral)   Resp 15   Ht 5\' 10"  (1.778 m)   Wt 81.6 kg   SpO2 94%   BMI 25.83 kg/m  Pain Scale: 0-10   Pain Score: 5    SpO2: SpO2: 94 % O2 Device:SpO2: 94 % O2 Flow Rate: .   IO: Intake/output summary: No intake or output data in the 24 hours ending 06/28/23 1046  LBM:   Baseline Weight: Weight: 77.6 kg Most recent weight: Weight: 81.6 kg     Palliative Assessment/Data:  Time Total: 80 min  Greater than 50%  of this time was spent counseling and coordinating care related to the above assessment and plan.  Signed by: Yong Channel, NP Palliative Medicine Team Pager # [phone] (M-F 8a-5p) Team Phone # [phone] (Nights/Weekends)
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
## 2023-06-28 NOTE — Progress Notes (Signed)
 PHARMACY - PHYSICIAN COMMUNICATION CRITICAL VALUE ALERT - BLOOD CULTURE IDENTIFICATION (BCID)  [name] is an 68 y.o. male who presented to [facility] on 06/27/2023 with a chief complaint of management of ESRD/HD.  Assessment:  1/3 bottles growing staph species - likely contaminant  Name of physician (or Provider) Contacted: Dr. Michell Ahumada  Current antibiotics: Rocephin  1gm IV q24h  Changes to prescribed antibiotics recommended:  No additional abx recommended  Results for orders placed or performed during the hospital encounter of 06/27/23  Blood Culture ID Panel (Reflexed) (Collected: 06/27/2023  4:48 PM)  Result Value Ref Range   Enterococcus faecalis NOT DETECTED NOT DETECTED   Enterococcus Faecium NOT DETECTED NOT DETECTED   Listeria monocytogenes NOT DETECTED NOT DETECTED   Staphylococcus species DETECTED (A) NOT DETECTED   Staphylococcus aureus (BCID) NOT DETECTED NOT DETECTED   Staphylococcus epidermidis NOT DETECTED NOT DETECTED   Staphylococcus lugdunensis NOT DETECTED NOT DETECTED   Streptococcus species NOT DETECTED NOT DETECTED   Streptococcus agalactiae NOT DETECTED NOT DETECTED   Streptococcus pneumoniae NOT DETECTED NOT DETECTED   Streptococcus pyogenes NOT DETECTED NOT DETECTED   A.calcoaceticus-baumannii NOT DETECTED NOT DETECTED   Bacteroides fragilis NOT DETECTED NOT DETECTED   Enterobacterales NOT DETECTED NOT DETECTED   Enterobacter cloacae complex NOT DETECTED NOT DETECTED   Escherichia coli NOT DETECTED NOT DETECTED   Klebsiella aerogenes NOT DETECTED NOT DETECTED   Klebsiella oxytoca NOT DETECTED NOT DETECTED   Klebsiella pneumoniae NOT DETECTED NOT DETECTED   Proteus species NOT DETECTED NOT DETECTED   Salmonella species NOT DETECTED NOT DETECTED   Serratia marcescens NOT DETECTED NOT DETECTED   Haemophilus influenzae NOT DETECTED NOT DETECTED   Neisseria meningitidis NOT DETECTED NOT DETECTED   Pseudomonas aeruginosa NOT DETECTED NOT DETECTED    Stenotrophomonas maltophilia NOT DETECTED NOT DETECTED   Candida albicans NOT DETECTED NOT DETECTED   Candida auris NOT DETECTED NOT DETECTED   Candida glabrata NOT DETECTED NOT DETECTED   Candida krusei NOT DETECTED NOT DETECTED   Candida parapsilosis NOT DETECTED NOT DETECTED   Candida tropicalis NOT DETECTED NOT DETECTED   Cryptococcus neoformans/gattii NOT DETECTED NOT DETECTED    Enrigue Harvard, PharmD, BCPS Please see amion for complete clinical pharmacist phone list 06/28/2023  9:08 PM

## 2023-06-28 NOTE — ED Notes (Signed)
 ED TO INPATIENT HANDOFF REPORT  ED Nurse Name and Phone #: Graceann Laurel  S Name/Age/Gender [name] 68 y.o. male Room/Bed: 012C/012C  Code Status   Code Status: Limited: Do not attempt resuscitation (DNR) -DNR-LIMITED -Do Not Intubate/DNI   Home/SNF/Other Skilled nursing facility Patient oriented to: self Is this baseline? No   Triage Complete: Triage complete  Chief Complaint Stroke [facility]) [I63.9] Acute ischemic stroke [facility]) [I63.9]  Triage Note Patient BIB [facility] from [facility] care for stroke like symptoms. Last well known 1100, symptoms discovered at 1230, Patient had slurred speech and altered mental status, no history of stroke,   Per Patients caretaker-Dialysis usually done M,W,F but last dialysis was on Thursday, Hx of a TIA last year, hx of falls with right hip fracture about 2-3 months ago and hx of pneumonia diagnosed 1 wk ago.   Fentanyl  patch removed today that was placed on 1/12   Allergies Allergies  Allergen Reactions   Bee Venom Anaphylaxis   Atorvastatin  Other (See Comments)    myalgia   Diltiazem  Itching   Lopressor  [Metoprolol ]     NIGHTMARES. Pt tolerating this now 11/25/22   Oxycodone  Other (See Comments)    Reaction not listed on MAR    Reglan  [Metoclopramide ] Other (See Comments)    Suicidal    Rosuvastatin  Other (See Comments)    myalgias   Valsartan  Itching   Zyrtec [Cetirizine] Other (See Comments)    intolerance    Level of Care/Admitting Diagnosis ED Disposition     ED Disposition  Admit   Condition  --   Comment  Hospital Area: [facility] [[id number]]  Level of Care: Telemetry Medical [104]  May admit patient to [facility] Ben[facility] or [facility] Smalling if equivalent level of care is available:: No  Covid Evaluation: Asymptomatic - no recent exposure (last 10 days) testing not required  Diagnosis: Acute ischemic stroke [facility]) [[id number]]  Admitting Physician: Modena Andes [[id number]]  Attending Physician:  Modena Andes [phone]  Certification:: I certify this patient will need inpatient services for at least 2 midnights  Expected Medical Readiness: 06/30/2023          B Medical/Surgery History Past Medical History:  Diagnosis Date   Acute blood loss anemia 03/20/2021   Acute metabolic encephalopathy 03/03/2022   Acute on chronic respiratory failure with hypoxia (HCC) 02/28/2022   Anemia    Arthritis    hips shoulders   CAD (coronary artery disease) 03/03/2019   Cancer (HCC)    Esophageal cancer 2022   Complete rotator cuff tear 08/26/2020   Depression 05/31/2021   Esophagitis    ESRD on hemodialysis (HCC)    Fatigue 10/14/2020   gram neg rod Bacteremia 03/01/2022   Heart failure with mildly reduced ejection fraction (HFmrEF) (HCC)    Hip fracture (HCC) 09/09/2022   Hypertension    Myocardial infarction (HCC)    Sleep apnea    uses a bipap machine   Stroke (HCC) 12/14/2020   no last weakness or paralysis   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   Past Surgical History:  Procedure Laterality Date   AV FISTULA PLACEMENT Left 12/21/2021   Procedure: LEFT ARM ARTERIOVENOUS (AV) FISTULA CREATION;  Surgeon: Mayo Speck, MD;  Location: [facility] ORS;  Service: Vascular;  Laterality: Left;   BIOPSY  06/27/2019   Procedure: BIOPSY;  Surgeon: Ruby Corporal, MD;  Location: [facility] ENDO SU[facility];  Service: Endoscopy;;  ascending colon polyp   BIOPSY  10/21/2020   Procedure: BIOPSY;  Surgeon: Ruby Corporal, MD;  [facility];  Service: Endoscopy;;  esophageal,gastric polyp   BIOPSY  02/24/2021   Procedure: BIOPSY;  Surgeon: Ruby Corporal, MD;  [facility];  Service: Endoscopy;;  distal and proximal esophageal biopsies    BIOPSY  05/30/2022   Procedure: BIOPSY;  Surgeon: Vinetta Greening, DO;  [facility];  Service: Endoscopy;;   CARDIAC SURGERY     CATARACT EXTRACTION     COLONOSCOPY  N/A 06/27/2019   [name]:Diverticulosis in the entire examined colon. tubular adenoma in ascending colon, 4mm tubular adenoma in prox sigmoid. external hemorrhoids   CORONARY ARTERY BYPASS GRAFT N/A 03/25/2019   Procedure: CORONARY ARTERY BYPASS GRAFTING (CABG) X  4 USING LEFT INTERNAL MAMMARY ARTERY AND RIGHT SAPHENOUS VEIN GRAFTS;  Surgeon: Hilarie Lovely, MD;  Location: [facility] OR;  Service: Open Heart Surgery;  Laterality: N/A;   ESOPHAGEAL BRUSHING  03/08/2022   Procedure: ESOPHAGEAL BRUSHING;  Surgeon: Vinetta Greening, DO;  [facility];  Service: Endoscopy;;   ESOPHAGOGASTRODUODENOSCOPY (EGD) WITH PROPOFOL  N/A 10/21/2020   rehman:Normal hypopharynx.Normal proximal esophagus and mid esophagus.Esophageal mucosal changes consistent with long-segment Barrett's esophagus. (Focal low-grade dysplasia and atypia proximally) z line irregular 37 cm from incisors, 3 cm HH, single gastric polyp (fundic gland) normal duodenal bulb/second portion of duodenum, proximal margin of Barrett's at 34 cm   ESOPHAGOGASTRODUODENOSCOPY (EGD) WITH PROPOFOL  N/A 02/24/2021   Procedure: ESOPHAGOGASTRODUODENOSCOPY (EGD) WITH PROPOFOL ;  Surgeon: Ruby Corporal, MD;  [facility];  Service: Endoscopy;  Laterality: N/A;  1:35   ESOPHAGOGASTRODUODENOSCOPY (EGD) WITH PROPOFOL  N/A 03/21/2021   Procedure: ESOPHAGOGASTRODUODENOSCOPY (EGD) WITH PROPOFOL ;  Surgeon: Alvis Jourdain, MD;  Location: [facility] ENDOSCOPY;  Service: Endoscopy;  Laterality: N/A;   ESOPHAGOGASTRODUODENOSCOPY (EGD) WITH PROPOFOL  N/A 10/23/2021   Procedure: ESOPHAGOGASTRODUODENOSCOPY (EGD) WITH PROPOFOL ;  Surgeon: Albertina Hugger, MD;  Location: [facility] ENDOSCOPY;  Service: Gastroenterology;  Laterality: N/A;   ESOPHAGOGASTRODUODENOSCOPY (EGD) WITH PROPOFOL  N/A 03/08/2022   Procedure: ESOPHAGOGASTRODUODENOSCOPY (EGD) WITH PROPOFOL ;  Surgeon: Vinetta Greening, DO;  [facility];  Service: Endoscopy;  Laterality: N/A;    ESOPHAGOGASTRODUODENOSCOPY (EGD) WITH PROPOFOL  N/A 05/27/2022   Procedure: ESOPHAGOGASTRODUODENOSCOPY (EGD) WITH PROPOFOL ;  Surgeon: Urban Garden, MD;  [facility];  Service: Gastroenterology;  Laterality: N/A;   ESOPHAGOGASTRODUODENOSCOPY (EGD) WITH PROPOFOL  N/A 05/30/2022   Procedure: ESOPHAGOGASTRODUODENOSCOPY (EGD) WITH PROPOFOL ;  Surgeon: Vinetta Greening, DO;  [facility];  Service: Endoscopy;  Laterality: N/A;   EXCISION MORTON'S NEUROMA     EYE SURGERY Left    retina   INCISION AND DRAINAGE OF WOUND Right 06/28/2021   Procedure: IRRIGATION AND DEBRIDEMENT WOUND;  Surgeon: Barb Bonito, MD;  Location: [facility] OR;  Service: Plastics;  Laterality: Right;  1.5 hour   INSERTION OF DIALYSIS CATHETER Right 12/21/2021   Procedure: INSERTION OF TUNNELED DIALYSIS CATHETER;  Surgeon: Mayo Speck, MD;  Location: [facility];  Service: Vascular;  Laterality: Right;   INTRAMEDULLARY (IM) NAIL INTERTROCHANTERIC Right 09/10/2022   Procedure: INTRAMEDULLARY (IM) NAIL INTERTROCHANTERIC;  Surgeon: Murleen Arms, MD;  [facility];  Service: Orthopedics;  Laterality: Right;   LEFT HEART CATH AND CORS/GRAFTS ANGIOGRAPHY N/A 02/01/2022   Procedure: LEFT HEART CATH AND CORS/GRAFTS ANGIOGRAPHY;  Surgeon: Swaziland, Peter M, MD;  Location: [facility] INVASIVE CV LAB;  Service: Cardiovascular;  Laterality: N/A;   POLYPECTOMY  06/27/2019  Procedure: POLYPECTOMY;  Surgeon: Ruby Corporal, MD;  [facility];  Service: Endoscopy;;  proximal sigmoid colon   RIGHT/LEFT HEART CATH AND CORONARY ANGIOGRAPHY N/A 03/12/2019   Procedure: RIGHT/LEFT HEART CATH AND CORONARY ANGIOGRAPHY;  Surgeon: Lucendia Rusk, MD;  Location: [facility] INVASIVE CV LAB;  Service: Cardiovascular;  Laterality: N/A;   SHOULDER ARTHROSCOPY WITH ROTATOR CUFF REPAIR AND SUBACROMIAL DECOMPRESSION Right 08/26/2020   Procedure: RIGHT SHOULDER MINI OPEN ROTATOR CUFF REPAIR AND SUBACROMIAL DECOMPRESSION WITH PATCH  GRAFT;  Surgeon: Orvan Blanch, MD;  Location: [facility]S;  Service: Orthopedics;  Laterality: Right;  90 MINS GENERAL WITH BLOCK   SKIN SPLIT GRAFT Right 06/28/2021   Procedure: SKIN GRAFT SPLIT THICKNESS;  Surgeon: Barb Bonito, MD;  Location[facility];  Service: Plastics;  Laterality: Right;   TEE WITHOUT CARDIOVERSION N/A 03/25/2019   Procedure: TRANSESOPHAGEAL ECHOCARDIOGRAM (TEE);  Surgeon: Hilarie Lovely, MD;  Location: [facility] OR;  Service: Open Heart Surgery;  Laterality: N/A;     A IV Location/Drains/Wounds Patient Lines/Drains/Airways Status     Active Line/Drains/Airways     Name Placement date Placement time Site Days   Peripheral IV 06/27/23 20 G Right Antecubital 06/27/23  1515  Antecubital  1   Fistula / Graft Left Forearm Arteriovenous fistula 12/21/21  1112  Forearm  554   External Urinary Catheter 06/27/23  1951  --  1   Wound / Incision (Open or Dehisced) 02/13/23 Coccyx Right;Left 02/13/23  1656  Coccyx  135            Intake/Output Last 24 hours No intake or output data in the 24 hours ending 06/28/23 1432  Labs/Imaging Results for orders placed or performed during the hospital encounter of 06/27/23 (from the past 48 hours)  CBG monitoring, ED     Status: Abnormal   Collection Time: 06/27/23  3:02 PM  Result Value Ref Range   Glucose-Capillary 100 (H) 70 - 99 mg/dL    Comment: Glucose reference range applies only to samples taken after fasting for at least 8 hours.  I-stat chem 8, ED     Status: Abnormal   Collection Time: 06/27/23  3:21 PM  Result Value Ref Range   Sodium 136 135 - 145 mmol/L   Potassium 4.0 3.5 - 5.1 mmol/L   Chloride 97 (L) 98 - 111 mmol/L   BUN 38 (H) 8 - 23 mg/dL   Creatinine, Ser 2.13 (H) 0.61 - 1.24 mg/dL   Glucose, Bld 086 (H) 70 - 99 mg/dL    Comment: Glucose reference range applies only to samples taken after fasting for at least 8 hours.   Calcium , Ion 1.04 (L) 1.15 - 1.40 mmol/L   TCO2 28 22 - 32 mmol/L   Hemoglobin 11.9  (L) 13.0 - 17.0 g/dL   HCT 57.8 (L) 46.9 - 62.9 %  Ethanol     Status: None   Collection Time: 06/27/23  3:39 PM  Result Value Ref Range   Alcohol, Ethyl (B) <10 <10 mg/dL    Comment: (NOTE) Lowest detectable limit for serum alcohol is 10 mg/dL.  For medical purposes only. Performed at [facility], [address], [place], [place] [address]   Protime-INR     Status: None   Collection Time: 06/27/23  3:40 PM  Result Value Ref Range   Prothrombin Time 14.9 11.4 - 15.2 seconds   INR 1.1 0.8 - 1.2    Comment: (NOTE) INR goal varies based on device and  disease states. Performed at [facility], [address], [place], [place] [address]   APTT     Status: Abnormal   Collection Time: 06/27/23  3:40 PM  Result Value Ref Range   aPTT 39 (H) 24 - 36 seconds    Comment:        IF BASELINE aPTT IS ELEVATED, SUGGEST PATIENT RISK ASSESSMENT BE USED TO DETERMINE APPROPRIATE ANTICOAGULANT THERAPY. Performed at [facility], [address] [place], [place] [address]   CBC     Status: Abnormal   Collection Time: 06/27/23  3:40 PM  Result Value Ref Range   WBC 7.9 4.0 - 10.5 K/uL   RBC 4.42 4.22 - 5.81 MIL/uL   Hemoglobin 10.7 (L) 13.0 - 17.0 g/dL   HCT 09.8 (L) 11.9 - 14.7 %   MCV 82.1 80.0 - 100.0 fL   MCH 24.2 (L) 26.0 - 34.0 pg   MCHC 29.5 (L) 30.0 - 36.0 g/dL   RDW 82.9 (H) 56.2 - 13.0 %   Platelets 223 150 - 400 K/uL   nRBC 0.0 0.0 - 0.2 %    Comment: Performed at [facility], [address], [place], [place] [address]  Differential     Status: None   Collection Time: 06/27/23  3:40 PM  Result Value Ref Range   Neutrophils Relative % 76 %   Neutro Abs 6.1 1.7 - 7.7 K/uL   Lymphocytes Relative 11 %   Lymphs Abs 0.9 0.7 - 4.0 K/uL   Monocytes Relative 9 %   Monocytes Absolute 0.7 0.1 - 1.0 K/uL   Eosinophils Relative 2 %   Eosinophils Absolute 0.1 0.0 - 0.5 K/uL   Basophils Relative 1 %   Basophils Absolute 0.1 0.0 - 0.1 K/uL   Immature  Granulocytes 1 %   Abs Immature Granulocytes 0.04 0.00 - 0.07 K/uL    Comment: Performed at [facility], [address] [place], [place] [address]  Comprehensive metabolic panel     Status: Abnormal   Collection Time: 06/27/23  3:40 PM  Result Value Ref Range   Sodium 136 135 - 145 mmol/L   Potassium 4.1 3.5 - 5.1 mmol/L   Chloride 96 (L) 98 - 111 mmol/L   CO2 25 22 - 32 mmol/L   Glucose, Bld 101 (H) 70 - 99 mg/dL    Comment: Glucose reference range applies only to samples taken after fasting for at least 8 hours.   BUN 39 (H) 8 - 23 mg/dL   Creatinine, Ser 9.52 (H) 0.61 - 1.24 mg/dL   Calcium  8.8 (L) 8.9 - 10.3 mg/dL   Total Protein 6.3 (L) 6.5 - 8.1 g/dL   Albumin  2.7 (L) 3.5 - 5.0 g/dL   AST 11 (L) 15 - 41 U/L   ALT 7 0 - 44 U/L   Alkaline Phosphatase 91 38 - 126 U/L   Total Bilirubin 1.1 0.0 - 1.2 mg/dL   GFR, Estimated 10 (L) >60 mL/min    Comment: (NOTE) Calculated using the CKD-EPI Creatinine Equation (2021)    Anion gap 15 5 - 15    Comment: Performed at [facility], [address] [place], [place] [address]  I-Stat CG4 Lactic Acid     Status: None   Collection Time: 06/27/23  3:48 PM  Result Value Ref Range   Lactic Acid, Venous 1.3 0.5 - 1.9 mmol/L  Culture, blood (Routine X 2) w Reflex to ID Panel  Status: None (Preliminary result)   Collection Time: 06/27/23  4:48 PM   Specimen: BLOOD RIGHT ARM  Result Value Ref Range   Specimen Description BLOOD RIGHT ARM    Special Requests      BOTTLES DRAWN AEROBIC AND ANAEROBIC Blood Culture results may not be optimal due to an inadequate volume of blood received in culture bottles   Culture      NO GROWTH < 24 HOURS Performed at [facility], [address], [place], [place] [address]    Report Status PENDING   TSH     Status: Abnormal   Collection Time: 06/27/23  4:48 PM  Result Value Ref Range   TSH 11.664 (H) 0.350 - 4.500 uIU/mL    Comment: Performed by a 3rd Generation assay with a  functional sensitivity of <=0.01 uIU/mL. Performed at [facility], [address], [place], [place] [address]   CBG monitoring, ED     Status: Abnormal   Collection Time: 06/27/23  5:06 PM  Result Value Ref Range   Glucose-Capillary 112 (H) 70 - 99 mg/dL    Comment: Glucose reference range applies only to samples taken after fasting for at least 8 hours.  Culture, blood (Routine X 2) w Reflex to ID Panel     Status: None (Preliminary result)   Collection Time: 06/27/23  6:57 PM   Specimen: BLOOD RIGHT HAND  Result Value Ref Range   Specimen Description BLOOD RIGHT HAND    Special Requests      BOTTLES DRAWN AEROBIC ONLY Blood Culture results may not be optimal due to an inadequate volume of blood received in culture bottles   Culture      NO GROWTH < 12 HOURS Performed at [facility], [address] [place], [place] [address]    Report Status PENDING   I-Stat CG4 Lactic Acid     Status: None   Collection Time: 06/27/23  7:02 PM  Result Value Ref Range   Lactic Acid, Venous 1.1 0.5 - 1.9 mmol/L  Procalcitonin     Status: None   Collection Time: 06/27/23  7:19 PM  Result Value Ref Range   Procalcitonin 0.35 ng/mL    Comment:        Interpretation: PCT (Procalcitonin) <= 0.5 ng/mL: Systemic infection (sepsis) is not likely. Local bacterial infection is possible. (NOTE)       Sepsis PCT Algorithm           Lower Respiratory Tract                                      Infection PCT Algorithm    ----------------------------     ----------------------------         PCT < 0.25 ng/mL                PCT < 0.10 ng/mL          Strongly encourage             Strongly discourage   discontinuation of antibiotics    initiation of antibiotics    ----------------------------     -----------------------------       PCT 0.25 - 0.50 ng/mL            PCT 0.10 - 0.25 ng/mL               OR       >  80% decrease in PCT            Discourage initiation of                                             antibiotics      Encourage discontinuation           of antibiotics    ----------------------------     -----------------------------         PCT >= 0.50 ng/mL              PCT 0.26 - 0.50 ng/mL               AND        <80% decrease in PCT             Encourage initiation of                                             antibiotics       Encourage continuation           of antibiotics    ----------------------------     -----------------------------        PCT >= 0.50 ng/mL                  PCT > 0.50 ng/mL               AND         increase in PCT                  Strongly encourage                                      initiation of antibiotics    Strongly encourage escalation           of antibiotics                                     -----------------------------                                           PCT <= 0.25 ng/mL                                                 OR                                        > 80% decrease in PCT                                      Discontinue / Do not initiate  antibiotics  Performed at [facility], [address] [place], [place] [address]   Resp panel by RT-PCR (RSV, Flu A&B, Covid) Anterior Nasal Swab     Status: None   Collection Time: 06/27/23  8:00 PM   Specimen: Anterior Nasal Swab  Result Value Ref Range   SARS Coronavirus 2 by RT PCR NEGATIVE NEGATIVE   Influenza A by PCR NEGATIVE NEGATIVE   Influenza B by PCR NEGATIVE NEGATIVE    Comment: (NOTE) The Xpert Xpress SARS-CoV-2/FLU/RSV plus assay is intended as an aid in the diagnosis of influenza from Nasopharyngeal swab specimens and should not be used as a sole basis for treatment. Nasal washings and aspirates are unacceptable for Xpert Xpress SARS-CoV-2/FLU/RSV testing.  Fact Sheet for Patients: BloggerCourse.com  Fact Sheet for Healthcare  Providers: SeriousBroker.it  This test is not yet approved or cleared by the [place]  FDA and has been authorized for detection and/or diagnosis of SARS-CoV-2 by FDA under an Emergency Use Authorization (EUA). This EUA will remain in effect (meaning this test can be used) for the duration of the COVID-19 declaration under Section 564(b)(1) of the Act, 21 U.S.C. section 360bbb-3(b)(1), unless the authorization is terminated or revoked.     Resp Syncytial Virus by PCR NEGATIVE NEGATIVE    Comment: (NOTE) Fact Sheet for Patients: BloggerCourse.com  Fact Sheet for Healthcare Providers: SeriousBroker.it  This test is not yet approved or cleared by the [place]  FDA and has been authorized for detection and/or diagnosis of SARS-CoV-2 by FDA under an Emergency Use Authorization (EUA). This EUA will remain in effect (meaning this test can be used) for the duration of the COVID-19 declaration under Section 564(b)(1) of the Act, 21 U.S.C. section 360bbb-3(b)(1), unless the authorization is terminated or revoked.  Performed at [facility] Lab, [address], [place], [place] [address]   Urinalysis, w/ Reflex to Culture (Infection Suspected) -Urine, Clean Catch     Status: Abnormal   Collection Time: 06/28/23  2:14 AM  Result Value Ref Range   Specimen Source URINE, CLEAN CATCH    Color, Urine YELLOW YELLOW   APPearance HAZY (A) CLEAR   Specific Gravity, Urine 1.021 1.005 - 1.030   pH 6.0 5.0 - 8.0   Glucose, UA 50 (A) NEGATIVE mg/dL   Hgb urine dipstick SMALL (A) NEGATIVE   Bilirubin Urine NEGATIVE NEGATIVE   Ketones, ur 5 (A) NEGATIVE mg/dL   Protein, ur 098 (A) NEGATIVE mg/dL   Nitrite NEGATIVE NEGATIVE   Leukocytes,Ua SMALL (A) NEGATIVE   RBC / HPF 6-10 0 - 5 RBC/hpf   WBC, UA 21-50 0 - 5 WBC/hpf    Comment:        Reflex urine culture not performed if WBC <=10, OR if Squamous  epithelial cells >5. If Squamous epithelial cells >5 suggest recollection.    Bacteria, UA FEW (A) NONE SEEN   Squamous Epithelial / HPF 0-5 0 - 5 /HPF    Comment: Performed at [facility] Lab, [address] [place], [place] [address]  Lipid panel     Status: None   Collection Time: 06/28/23 11:44 AM  Result Value Ref Range   Cholesterol 155 0 - 200 mg/dL   Triglycerides 782 <956 mg/dL   HDL 57 >21 mg/dL   Total CHOL/HDL Ratio 2.7 RATIO   VLDL 23 0 - 40 mg/dL   LDL Cholesterol 75 0 - 99 mg/dL    Comment:        Total  Cholesterol/HDL:CHD Risk Coronary Heart Disease Risk Table                     Men   Women  1/2 Average Risk   3.4   3.3  Average Risk       5.0   4.4  2 X Average Risk   9.6   7.1  3 X Average Risk  23.4   11.0        Use the calculated Patient Ratio above and the CHD Risk Table to determine the patient's CHD Risk.        ATP III CLASSIFICATION (LDL):  <100     mg/dL   Optimal  161-096  mg/dL   Near or Above                    Optimal  130-159  mg/dL   Borderline  045-409  mg/dL   High  >811     mg/dL   Very High Performed at [facility] Lab, [address], [place], [place] [address]   Hepatitis B surface antigen     Status: None   Collection Time: 06/28/23 11:44 AM  Result Value Ref Range   Hepatitis B Surface Ag NON REACTIVE NON REACTIVE    Comment: Performed at [facility], [address] [place], [place] [address]   ECHOCARDIOGRAM COMPLETE Result Date: 06/28/2023    ECHOCARDIOGRAM REPORT   Patient Name:   GAJE SECKINGER Date of Exam: 06/28/2023 Medical Rec #:  [id number]        Height:       70.0 in Accession #:    [id number]       Weight:       180.0 lb Date of Birth:  1956-06-04        BSA:          1.996 m Patient Age:    67 years         BP:           161/72 mmHg Patient Gender: M                HR:           64 bpm. Exam Location:  Inpatient Procedure: 2D Echo, Color Doppler and Cardiac Doppler Indications:    Stroke  History:         Patient has prior history of Echocardiogram examinations, most                 recent 02/14/2023. Stroke.  Sonographer:    Hersey Lorenzo RDCS Referring Phys: [id number] CHING T TU IMPRESSIONS  1. Left ventricular ejection fraction, by estimation, is 45 to 50%. The left ventricle has mildly decreased function. The left ventricle demonstrates regional wall motion abnormalities (see scoring diagram/findings for description). The left ventricular  internal cavity size was mildly dilated. There is mild left ventricular hypertrophy. Left ventricular diastolic parameters are consistent with Grade II diastolic dysfunction (pseudonormalization). Elevated left atrial pressure.  2. Right ventricular systolic function is mildly reduced. The right ventricular size is mildly enlarged. Tricuspid regurgitation signal is inadequate for assessing PA pressure.  3. Left atrial size was moderately dilated.  4. Right atrial size was moderately dilated.  5. The mitral valve is degenerative. Trivial mitral valve regurgitation. No evidence of mitral stenosis. Severe mitral annular calcification.  6. The aortic valve is tricuspid. Aortic valve regurgitation is not visualized. No aortic stenosis is present.  7. The inferior vena cava is dilated in size with <50% respiratory variability, suggesting right atrial pressure of 15 mmHg. FINDINGS  Left Ventricle: Left ventricular ejection fraction, by estimation, is 45 to 50%. The left ventricle has mildly decreased function. The left ventricle demonstrates regional wall motion abnormalities. The left ventricular internal cavity size was mildly dilated. There is mild left ventricular hypertrophy. Left ventricular diastolic parameters are consistent with Grade II diastolic dysfunction (pseudonormalization). Elevated left atrial pressure.  LV Wall Scoring: The basal inferolateral segment and basal inferior segment are akinetic. The entire anterior wall, antero-lateral wall, mid and distal lateral wall,  entire septum, entire apex, and mid and distal inferior wall are normal. Right Ventricle: The right ventricular size is mildly enlarged. Right vetricular wall thickness was not well visualized. Right ventricular systolic function is mildly reduced. Tricuspid regurgitation signal is inadequate for assessing PA pressure. Left Atrium: Left atrial size was moderately dilated. Right Atrium: Right atrial size was moderately dilated. Pericardium: There is no evidence of pericardial effusion. Mitral Valve: The mitral valve is degenerative in appearance. Severe mitral annular calcification. Trivial mitral valve regurgitation. No evidence of mitral valve stenosis. Tricuspid Valve: The tricuspid valve is normal in structure. Tricuspid valve regurgitation is trivial. Aortic Valve: The aortic valve is tricuspid. Aortic valve regurgitation is not visualized. No aortic stenosis is present. Pulmonic Valve: The pulmonic valve was grossly normal. Pulmonic valve regurgitation is trivial. Aorta: The aortic root and ascending aorta are structurally normal, with no evidence of dilitation. Venous: The inferior vena cava is dilated in size with less than 50% respiratory variability, suggesting right atrial pressure of 15 mmHg. IAS/Shunts: The interatrial septum was not well visualized.  LEFT VENTRICLE PLAX 2D LVIDd:         5.90 cm   Diastology LVIDs:         5.30 cm   LV e' medial:    5.98 cm/s LV PW:         1.20 cm   LV E/e' medial:  21.6 LV IVS:        1.10 cm   LV e' lateral:   7.62 cm/s LVOT diam:     2.20 cm   LV E/e' lateral: 16.9 LV SV:         87 LV SV Index:   44 LVOT Area:     3.80 cm  RIGHT VENTRICLE         IVC TAPSE (M-mode): 1.1 cm  IVC diam: 3.40 cm LEFT ATRIUM              Index        RIGHT ATRIUM           Index LA diam:        5.70 cm  2.86 cm/m   RA Area:     26.90 cm LA Vol (A2C):   72.8 ml  36.47 ml/m  RA Volume:   89.30 ml  44.74 ml/m LA Vol (A4C):   101.0 ml 50.60 ml/m LA Biplane Vol: 89.1 ml  44.64 ml/m   AORTIC VALVE LVOT Vmax:   106.00 cm/s LVOT Vmean:  70.400 cm/s LVOT VTI:    0.230 m  AORTA Ao Root diam: 3.40 cm Ao Asc diam:  3.70 cm MITRAL VALVE MV Area (PHT): 4.40 cm     SHUNTS MV E velocity: 129.00 cm/s  Systemic VTI:  0.23 m MV A velocity: 82.00 cm/s   Systemic Diam: 2.20 cm MV E/A ratio:  1.57 Carson Clara  MD Electronically signed by Carson Clara MD Signature Date/Time: 06/28/2023/1:08:43 PM    Final    MR BRAIN WO CONTRAST Result Date: 06/27/2023 CLINICAL DATA:  Stroke, follow up EXAM: MRI HEAD WITHOUT CONTRAST TECHNIQUE: Multiplanar, multiecho pulse sequences of the brain and surrounding structures were obtained without intravenous contrast. COMPARISON:  CT Head 06/27/23 FINDINGS: Brain: There are multiple scattered foci of acute infarct in the bilateral cerebral hemispheres, including the right parietal lobe (series 5, image 89), in the left frontal lobe (series 5, image 93), right frontal lobe (series 5, image 79), and left parietal lobe (series 5, image 79). There are multiple scattered foci of microhemorrhage, predominantly in a central distribution. There is a background of moderate to severe chronic microvascular ischemic change. Vascular: Normal flow voids. Skull and upper cervical spine: Normal marrow signal. Sinuses/Orbits: Trace left mastoid effusion. No middle ear effusion. Paranasal sinuses are clear. Bilateral lens replacement. Orbits are otherwise unremarkable. Other: None. IMPRESSION: Multiple scattered foci of acute infarct in the bilateral cerebral hemispheres, as described above. No hemorrhage or mass effect. Given distribution, these are favored to be secondary to a central embolic etiology. Findings were paged to Dr. Alecia Ames on 06/27/23 at 6:29 PM. Electronically Signed   By: Clora Dane M.D.   On: 06/27/2023 18:29   DG Chest Port 1 View Result Date: 06/27/2023 CLINICAL DATA:  Shortness of breath. EXAM: PORTABLE CHEST 1 VIEW COMPARISON:  Chest radiograph dated  May 31, 2023. FINDINGS: Mild cardiomegaly, unchanged. Prior median sternotomy and CABG. Small right pleural effusion with right basilar atelectasis and patchy opacities, similar to slightly increased compared to the prior exam. Streaky left basilar opacities. No pneumothorax. No acute osseous abnormality. Overlapping telemetry wires. IMPRESSION: 1. Small right pleural effusion with right basilar atelectasis, similar to slightly increased compared to the prior exam. Patchy right basilar opacities may reflect atelectasis or infiltrate. 2. Streaky left basilar opacities could reflect atelectasis or infiltrate. Electronically Signed   By: Mannie Seek M.D.   On: 06/27/2023 16:26   CT ANGIO HEAD NECK W WO CM W PERF (CODE STROKE) Result Date: 06/27/2023 CLINICAL DATA:  Altered mental status, slurred speech, stroke suspected EXAM: CT ANGIOGRAPHY HEAD AND NECK CT PERFUSION BRAIN TECHNIQUE: Multidetector CT imaging of the head and neck was performed using the standard protocol during bolus administration of intravenous contrast. Multiplanar CT image reconstructions and MIPs were obtained to evaluate the vascular anatomy. Carotid stenosis measurements (when applicable) are obtained utilizing NASCET criteria, using the distal internal carotid diameter as the denominator. Multiphase CT imaging of the brain was performed following IV bolus contrast injection. Subsequent parametric perfusion maps were calculated using RAPID software. RADIATION DOSE REDUCTION: This exam was performed according to the departmental dose-optimization program which includes automated exposure control, adjustment of the mA and/or kV according to patient size and/or use of iterative reconstruction technique. CONTRAST:  OMNIPAQUE  IOHEXOL  350 MG/ML SOLN COMPARISON:  No prior CTA available, correlation is made with 06/27/2023 CT stroke code and MRA head 12/14/2020 FINDINGS: CT HEAD FINDINGS For noncontrast findings, please see same day CT  head. CTA NECK FINDINGS Aortic arch: Standard branching. Imaged portion shows no evidence of aneurysm or dissection. No significant stenosis of the major arch vessel origins. Aortic atherosclerosis. Right carotid system: 60% stenosis in the proximal right ICA (series 6, image 209). No evidence of dissection. Left carotid system: No evidence of dissection, occlusion, or hemodynamically significant stenosis (greater than 50%). Vertebral arteries: Severe stenosis at the origin of the right vertebral  artery (series 6, image 284), with additional severe stenosis in the right V2 segment (series 6, image 210). The left vertebral artery is patent from its origin to the skull base without significant stenosis. No evidence of dissection. Skeleton: No acute osseous abnormality. Degenerative changes in the cervical spine. Other neck: No acute finding. Upper chest: Moderate right and trace left pleural effusions. Review of the MIP images confirms the above findings CTA HEAD FINDINGS Anterior circulation: Both internal carotid arteries are patent to the termini, with severe stenosis in the proximal right supraclinoid ICA (series 6, image 113) and moderate stenosis in left supraclinoid ICA (series 6, images 107-108). A1 segments patent, with mild stenosis in the distal right A1. Normal anterior communicating artery. Anterior cerebral arteries are patent to their distal aspects without significant stenosis. No M1 stenosis or occlusion. MCA branches perfused to their distal aspects without significant stenosis. Posterior circulation: Severe stenosis in the right V4 just past the dural reflection (series 6, image 166), with an additional segment of severe stenosis in the proximal to mid V4 (series 6, image 150-159), and complete occlusion of the mid to distal right V4 (series 6, images 140-144). The proximal stenosis in mid to distal occlusion appears similar to the prior MRA from 2022. The left vertebral artery is dominant and  patent to the vertebrobasilar junction without significant stenosis. Posterior inferior cerebellar arteries patent proximally. Basilar patent to its distal aspect without significant stenosis. Superior cerebellar arteries patent proximally. Patent P1 segments. PCAs perfused to their distal aspects without significant stenosis. The bilateral posterior communicating arteries are not visualized. Venous sinuses: As permitted by contrast timing, patent. Anatomic variants: None significant. No evidence of aneurysm or vascular malformation. Review of the MIP images confirms the above findings CT Brain Perfusion Findings: ASPECTS: 10 CBF (<30%) Volume: 0mL Perfusion (Tmax>6.0s) volume: 13mL Mismatch Volume: 13mL Infarction Location:No infarct core detected. Area of possible penumbra in the superior right cerebellum, favored to be artifactual. IMPRESSION: 1. 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 2. Severe stenosis in the proximal right supraclinoid ICA and moderate stenosis in the left supraclinoid ICA. 3. 60% stenosis in the proximal right ICA. 4. Severe stenosis at the origin of the right vertebral artery, with additional severe stenosis in the right V2 segment. 5. No infarct core detected. Area of possible penumbra in the superior right cerebellum, favored to be artifactual. 6. Moderate right and trace left pleural effusions. 7. Aortic atherosclerosis. Aortic Atherosclerosis (ICD10-I70.0). Imaging results were communicated on 06/27/2023 at 3:49 pm to provider Dr. Alecia Ames via secure text paging. Electronically Signed   By: Zoila Hines M.D.   On: 06/27/2023 15:49   CT HEAD CODE STROKE WO CONTRAST Result Date: 06/27/2023 CLINICAL DATA:  Code stroke.  Slurred speech EXAM: CT HEAD WITHOUT CONTRAST TECHNIQUE: Contiguous axial images were obtained from the base of the skull through the vertex without intravenous  contrast. RADIATION DOSE REDUCTION: This exam was performed according to the departmental dose-optimization program which includes automated exposure control, adjustment of the mA and/or kV according to patient size and/or use of iterative reconstruction technique. COMPARISON:  05/31/23 CT head. FINDINGS: Brain: No hemorrhage. No hydrocephalus. No extra-axial fluid collection. No CT evidence of an acute cortical infarct. No mass effect. No mass lesion. There is background of moderate chronic microvascular ischemic change. Generalized volume loss. Vascular: No hyperdense vessel or unexpected calcification. Skull: Normal. Negative for  fracture or focal lesion. Sinuses/Orbits: Middle ear or mastoid effusion. Paranasal sinuses are notable for frothy secretions in the right sphenoid sinus. Bilateral lens replacement. Orbits are otherwise unremarkable. Other: No ASPECTS ([place] Stroke Program Early CT Score): 10 IMPRESSION: No hemorrhage or CT evidence of an acute cortical infarct. Aspects is 10. Findings were paged to Dr. Alecia Ames on 06/27/23 at 3:21 PM. Electronically Signed   By: Clora Dane M.D.   On: 06/27/2023 15:21    Pending Labs Unresulted Labs (From admission, onward)     Start     Ordered   06/28/23 1054  Hepatitis B surface antibody,quantitative  (New Admission Hemo Labs (Hepatitis B))  Once,   R        06/28/23 1057   06/28/23 0214  Urine Culture  Once,   R        06/28/23 0214   06/27/23 1931  Hemoglobin A1c  (Labs)  Once,   R       Comments: To assess prior glycemic control    06/27/23 1932            Vitals/Pain Today's Vitals   06/28/23 0400 06/28/23 0708 06/28/23 0729 06/28/23 1158  BP: (!) 151/70 (!) 162/93  (!) 175/81  Pulse: 63 66  69  Resp: 13 15  15   Temp:   98 F (36.7 C) 98.7 F (37.1 C)  TempSrc:   Oral   SpO2: 95% 94%  95%  Weight:      Height:      PainSc:        Isolation Precautions No active isolations  Medications Medications  sodium chloride   flush (NS) 0.9 % injection 3 mL (3 mLs Intravenous Not Given 06/27/23 1920)  cefTRIAXone  (ROCEPHIN ) 1 g in sodium chloride  0.9 % 100 mL IVPB (0 g Intravenous Stopped 06/27/23 1850)   stroke: early stages of recovery book ( Does not apply Not Given 06/28/23 1003)  acetaminophen  (TYLENOL ) tablet 650 mg (has no administration in time range)    Or  acetaminophen  (TYLENOL ) 160 MG/5ML solution 650 mg (has no administration in time range)    Or  acetaminophen  (TYLENOL ) suppository 650 mg (has no administration in time range)  senna-docusate (Senokot-S) tablet 1 tablet (has no administration in time range)  heparin  injection 5,000 Units (5,000 Units Subcutaneous Given 06/28/23 0614)  aspirin  EC tablet 81 mg (81 mg Oral Not Given 06/28/23 1003)  clopidogrel  (PLAVIX ) tablet 300 mg (300 mg Oral Not Given 06/27/23 2027)  clopidogrel  (PLAVIX ) tablet 75 mg (75 mg Oral Not Given 06/28/23 1004)  amiodarone  (PACERONE ) tablet 200 mg (200 mg Oral Not Given 06/28/23 1003)  metoprolol  succinate (TOPROL -XL) 24 hr tablet 50 mg (50 mg Oral Not Given 06/28/23 1004)  sertraline  (ZOLOFT ) tablet 100 mg (100 mg Oral Not Given 06/28/23 1005)  pantoprazole  (PROTONIX ) EC tablet 40 mg (40 mg Oral Not Given 06/28/23 1004)  calcium  acetate (PHOSLO ) capsule 667 mg (has no administration in time range)    And  calcium  acetate (PHOSLO ) capsule 667 mg (667 mg Oral Not Given 06/28/23 1101)  doxycycline  (VIBRAMYCIN ) 100 mg in dextrose  5 % 250 mL IVPB (0 mg Intravenous Stopped 06/28/23 0813)  levothyroxine  (SYNTHROID ) tablet 50 mcg (has no administration in time range)  Chlorhexidine  Gluconate Cloth 2 % PADS 6 each (has no administration in time range)  morphine  (PF) 2 MG/ML injection 2 mg (2 mg Intravenous Given 06/28/23 1335)  iohexol  (OMNIPAQUE ) 350 MG/ML injection 100 mL (100 mLs Intravenous Contrast Given 06/27/23 1525)  LORazepam  (ATIVAN ) injection 1 mg (1 mg Intravenous Given 06/27/23 1710)    Mobility non-ambulatory     Focused  Assessments Neuro Assessment Handoff:  Swallow screen pass? No  Cardiac Rhythm: Normal sinus rhythm NIH Stroke Scale  Dizziness Present: No Headache Present: No Interval: Shift assessment Level of Consciousness (1a.)   : Alert, keenly responsive LOC Questions (1b. )   : Answers one question correctly LOC Commands (1c. )   : Performs neither task correctly Best Gaze (2. )  : Normal Visual (3. )  : No visual loss Facial Palsy (4. )    : Normal symmetrical movements Motor Arm, Left (5a. )   : No movement Motor Arm, Right (5b. ) : No movement Motor Leg, Left (6a. )  : No movement Motor Leg, Right (6b. ) : No movement Limb Ataxia (7. ): Amputation or joint fusion Sensory (8. )  : Normal, no sensory loss Best Language (9. )  : Severe aphasia Dysarthria (10. ): Normal Extinction/Inattention (11.)   : No Abnormality Complete NIHSS TOTAL: 21 Last date known well: 06/27/23 Last time known well: 1100 Neuro Assessment: Exceptions to WDL Neuro Checks:   Initial (06/27/23 1515)  Has TPA been given? No If patient is a Neuro Trauma and patient is going to OR before floor call report to 4N Charge nurse: [phone] or [phone]   R Recommendations: See Admitting Provider Note  Report given to:   Additional Notes: Pt is able to occasionally answer questions, did not pass swallow screen, extremities are stiff, some deficits are chronic, some are new, his sister is at bedside, and POA

## 2023-06-28 NOTE — Progress Notes (Signed)
 Received patient in bed to unit.  Alert and oriented.  Informed consent signed and in chart.   TX duration:3  Patient tolerated well.  Transported back to the room  Alert, without acute distress.  Hand-off given to patient's nurse.   Access used: left AVF Access issues: none  Total UF removed: 1.9L Medication(s) given: none   06/28/23 2152  Vitals  Temp 98.5 F (36.9 C)  Temp Source Oral  BP (!) 185/71  MAP (mmHg) 104  BP Location Right Wrist  BP Method Automatic  Patient Position (if appropriate) Lying  Pulse Rate 68  Pulse Rate Source Monitor  ECG Heart Rate 68  Resp 14  Oxygen Therapy  SpO2 99 %  O2 Device Nasal Cannula  O2 Flow Rate (L/min) 2 L/min  During Treatment Monitoring  Blood Flow Rate (mL/min) 300 mL/min  Arterial Pressure (mmHg) -75.14 mmHg  Venous Pressure (mmHg) 379.98 mmHg  TMP (mmHg) 13.94 mmHg  Ultrafiltration Rate (mL/min) 1195 mL/min  Dialysate Flow Rate (mL/min) 300 ml/min  Dialysate Potassium Concentration 3  Dialysate Calcium  Concentration 2.5  Duration of HD Treatment -hour(s) 3 hour(s)  Cumulative Fluid Removed (mL) per Treatment  1900.26  HD Safety Checks Performed Yes  Intra-Hemodialysis Comments Tx completed;Tolerated well  Dialysis Fluid Bolus Normal Saline  Bolus Amount (mL) 300 mL      Ryenne Lynam S [facility]

## 2023-06-29 DIAGNOSIS — Z515 Encounter for palliative care: Secondary | ICD-10-CM | POA: Diagnosis not present

## 2023-06-29 DIAGNOSIS — I639 Cerebral infarction, unspecified: Secondary | ICD-10-CM | POA: Diagnosis not present

## 2023-06-29 DIAGNOSIS — I6310 Cerebral infarction due to embolism of unspecified precerebral artery: Secondary | ICD-10-CM | POA: Diagnosis not present

## 2023-06-29 DIAGNOSIS — Z7189 Other specified counseling: Secondary | ICD-10-CM | POA: Diagnosis not present

## 2023-06-29 LAB — URINE CULTURE: Culture: NO GROWTH

## 2023-06-29 LAB — HEPATITIS B SURFACE ANTIBODY, QUANTITATIVE: Hep B S AB Quant (Post): 38.8 m[IU]/mL

## 2023-06-29 MED ORDER — ASPIRIN 300 MG RE SUPP
150.0000 mg | Freq: Every day | RECTAL | Status: DC
Start: 2023-06-29 — End: 2023-07-02
  Administered 2023-06-29 – 2023-07-01 (×3): 150 mg via RECTAL
  Filled 2023-06-29 (×3): qty 1

## 2023-06-29 MED ORDER — ORAL CARE MOUTH RINSE: 15.0000 mL | OROMUCOSAL | Status: DC | PRN

## 2023-06-29 MED ORDER — ORAL CARE MOUTH RINSE
15.0000 mL | OROMUCOSAL | Status: DC
  Administered 2023-06-29 – 2023-07-14 (×47): 15 mL via OROMUCOSAL

## 2023-06-29 MED ORDER — ISOSORBIDE MONONITRATE ER 30 MG PO TB24
30.0000 mg | ORAL_TABLET | Freq: Every day | ORAL | Status: DC
  Administered 2023-07-01 – 2023-07-09 (×5): 30 mg via ORAL
  Filled 2023-06-29 (×8): qty 1
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
## 2023-06-29 NOTE — Plan of Care (Signed)
  Problem: Ischemic Stroke/TIA Tissue Perfusion: Goal: Complications of ischemic stroke/TIA will be minimized Outcome: Progressing   

## 2023-06-29 NOTE — Evaluation (Signed)
Occupational Therapy Evaluation Patient Details Name: [name] MRN: [id number] DOB: 1955-11-18 Today's Date: 06/29/2023   History of Present Illness [name] is a 67 yo male presenting with stroke like symptoms, slurred, speech, and altered mental status. MRI revealed multiple scattered foci of acute infarct in the bilateral cerebral hemispheres, no hemorrhage or mass effect. PMHx TIA,CAD, cancer, ESRD on hemodialysis, heart failure, MI, diabetes, sleep apnea, R hip ORIF 08/2022   Clinical Impression   [name] was evaluated s/p the above admission list. Per chart review, he presented from [facility] and is typically bed or WC level at baseline - pt with LOA and unable to give PLOF. Upon evaluation the pt was limited by low LOA, RLE pain with PROM, BUE tightness/tremor with PROM, weakness and limited tolerance for activity. Overall he is total A +2 for all aspects of his care at bed level. Of note, pt maintained eyes closed throughout despite maximal stimulation. Pt will benefit from further acute OT assessment to determine functional level with expectation to sign off next date if pt is unable to participate. Recommend transition back to LT skilled without follow up OT.        If plan is discharge home, recommend the following: A lot of help with walking and/or transfers;Two people to help with walking and/or transfers;A lot of help with bathing/dressing/bathroom;Two people to help with bathing/dressing/bathroom;Assistance with cooking/housework;Assistance with feeding;Direct supervision/assist for medications management;Direct supervision/assist for financial management;Assist for transportation;Supervision due to cognitive status;Help with stairs or ramp for entrance    Functional Status Assessment  Patient has had a recent decline in their functional status and demonstrates the ability to make significant improvements in function in a reasonable and predictable amount of time.   Equipment Recommendations  None recommended by OT       Precautions / Restrictions Precautions Precautions: Fall Restrictions Weight Bearing Restrictions Per Provider Order: No      Mobility Bed Mobility Overal bed mobility: Needs Assistance Bed Mobility: Rolling Rolling: Total assist, +2 for physical assistance, +2 for safety/equipment              Transfers          ADL either performed or assessed with clinical judgement   ADL Overall ADL's : Needs assistance/impaired       General ADL Comments: total A for all aspects of care at bed level     Vision Baseline Vision/History: 2 Legally blind Vision Assessment?: Vision impaired- to be further tested in functional context Additional Comments: per chart, pt is blind in R eye. pt maintained eyes closed throughout session     Perception Perception: Not tested       Praxis Praxis: Not tested       Pertinent Vitals/Pain Pain Assessment Pain Assessment: Faces Faces Pain Scale: Hurts a little bit Pain Location: assume RLE with PROM Pain Descriptors / Indicators: Moaning Pain Intervention(s): Limited activity within patient's tolerance, Monitored during session     Extremity/Trunk Assessment Upper Extremity Assessment Upper Extremity Assessment: LUE deficits/detail;RUE deficits/detail RUE Deficits / Details: difficult flexion at most joints, tremor/spacticity? noted with PROM. no activation noted. RUE Coordination: decreased fine motor;decreased gross motor LUE Deficits / Details: difficult flexion at most joints, tremor/spacticity? noted with PROM. no activation noted. LUE Coordination: decreased fine motor;decreased gross motor   Lower Extremity Assessment Lower Extremity Assessment: Defer to PT evaluation   Cervical / Trunk Assessment Cervical / Trunk Assessment: Kyphotic   Communication Communication Communication: Difficulty following commands/understanding;Difficulty communicating  thoughts/reduced  clarity of speech   Cognition Arousal: Stuporous Behavior During Therapy: Flat affect Overall Cognitive Status: Difficult to assess                                 General Comments: eyes closed 100% of the session, no attempt at answering questions or following commands     General Comments  minimal active participation on evalutaion, plan to follow pt on a trial basis acutely with potential to sign off next session if no functional improvement.            Home Living Family/patient expects to be discharged to:: Skilled nursing facility           Additional Comments: Resides at LT SNF      Prior Functioning/Environment Prior Level of Function : History of Falls (last six months);Patient poor historian/Family not available             Mobility Comments: pt unable to report, per chart he is mostly bed/WC level ADLs Comments: pt unable to report, anticipate total care for ADLs        OT Problem List: Decreased strength;Decreased range of motion;Decreased activity tolerance;Decreased cognition;Decreased coordination;Impaired vision/perception;Impaired balance (sitting and/or standing);Decreased safety awareness;Decreased knowledge of use of DME or AE;Decreased knowledge of precautions      OT Treatment/Interventions: Self-care/ADL training;Therapeutic exercise;Energy conservation;DME and/or AE instruction;Therapeutic activities;Cognitive remediation/compensation;Balance training;Patient/family education    OT Goals(Current goals can be found in the care plan section) Acute Rehab OT Goals Patient Stated Goal: unable to state OT Goal Formulation: With patient Time For Goal Achievement: 07/13/23 Potential to Achieve Goals: Fair ADL Goals Pt Will Perform Eating: with mod assist;sitting Pt Will Perform Grooming: with mod assist;sitting Pt Will Perform Upper Body Bathing: with mod assist;sitting Additional ADL Goal #1: Pt will tolerate  unsupported sitting EOB for 5 minutes with CGA Additional ADL Goal #2: Pt will perform bed to wheelchair transfer with MAX A +2 as a precursor to OOB ADLs  OT Frequency: Min 1X/week    Co-evaluation PT/OT/SLP Co-Evaluation/Treatment: Yes Reason for Co-Treatment: Complexity of the patient's impairments (multi-system involvement);For patient/therapist safety;To address functional/ADL transfers   OT goals addressed during session: ADL's and self-care      AM-PAC OT "6 Clicks" Daily Activity     Outcome Measure Help from another person eating meals?: Total Help from another person taking care of personal grooming?: Total Help from another person toileting, which includes using toliet, bedpan, or urinal?: Total Help from another person bathing (including washing, rinsing, drying)?: Total Help from another person to put on and taking off regular upper body clothing?: Total Help from another person to put on and taking off regular lower body clothing?: Total 6 Click Score: 6   End of Session Nurse Communication: Mobility status  Activity Tolerance: Patient limited by lethargy Patient left: in bed;with call bell/phone within reach;with bed alarm set  OT Visit Diagnosis: Unsteadiness on feet (R26.81);Other abnormalities of gait and mobility (R26.89);History of falling (Z91.81);Muscle weakness (generalized) (M62.81)                Time: 7510-2585 OT Time Calculation (min): 22 min Charges:  OT General Charges $OT Visit: 1 Visit OT Evaluation $OT Eval Moderate Complexity: 1 Mod  Derenda Mis, OTR/L Acute Rehabilitation Services Office [phone] Secure Chat Communication Preferred\   Donia Pounds 06/29/2023, 10:35 AM

## 2023-06-29 NOTE — Evaluation (Signed)
Physical Therapy Evaluation Patient Details Name: [name] MRN: [id number] DOB: [birth year] Today's Date: 06/29/2023  History of Present Illness  [name] is a 68 yo male presenting with stroke like symptoms, slurred, speech, and altered mental status. MRI revealed multiple scattered foci of acute infarct in the bilateral cerebral hemispheres, no hemorrhage or mass effect. PMHx TIA,CAD, cancer, ESRD on hemodialysis, heart failure, MI, diabetes, sleep apnea, R hip ORIF 08/2022  Clinical Impression   Pt admitted secondary to problem above with deficits below. Per chart, pt was bed vs w/c bound PTA and residing in LTC facility. Pt currently requires +2 total assist for all mobility and not following commands. Will conduct trial of PT at 1x/wk x 2weeks to assess if pt could benefit from skilled PT. Will continue to follow acutely to maximize functional mobility and to address the problems listed below.          If plan is discharge home, recommend the following: Two people to help with walking and/or transfers;Assistance with feeding;Direct supervision/assist for medications management;Direct supervision/assist for financial management;Assist for transportation;Supervision due to cognitive status;Help with stairs or ramp for entrance   Can travel by private vehicle   No    Equipment Recommendations Hoyer lift;Hospital bed  Recommendations for Other Services       Functional Status Assessment Patient has had a recent decline in their functional status and/or demonstrates limited ability to make significant improvements in function in a reasonable and predictable amount of time     Precautions / Restrictions Precautions Precautions: Fall Restrictions Weight Bearing Restrictions Per Provider Order: No      Mobility  Bed Mobility Overal bed mobility: Needs Assistance Bed Mobility: Rolling Rolling: Total assist, +2 for physical assistance, +2 for safety/equipment               Transfers                   General transfer comment: deferred due to decr level of arousal    Ambulation/Gait                  Stairs            Wheelchair Mobility     Tilt Bed    Modified Rankin (Stroke Patients Only) Modified Rankin (Stroke Patients Only) Pre-Morbid Rankin Score: Severe disability Modified Rankin: Severe disability     Balance Overall balance assessment: Needs assistance, History of Falls Sitting-balance support: No upper extremity supported, Feet unsupported Sitting balance-Leahy Scale: Zero Sitting balance - Comments: chair-like position with lean to his rt                                     Pertinent Vitals/Pain Pain Assessment Pain Assessment: Faces Faces Pain Scale: Hurts even more Pain Location: RLE with PROM, unable to determine if hip or knee (tolerated ankle ROM without grimacing or moaning) Pain Descriptors / Indicators: Moaning Pain Intervention(s): Limited activity within patient's tolerance    Home Living Family/patient expects to be discharged to:: Other (Comment)     Type of Home: Other(Comment)             Additional Comments: Resides at LT SNF    Prior Function Prior Level of Function : History of Falls (last six months);Patient poor historian/Family not available             Mobility Comments: pt unable to  report, per chart he is mostly bed/WC level ADLs Comments: pt unable to report, anticipate total care for ADLs     Extremity/Trunk Assessment   Upper Extremity Assessment Upper Extremity Assessment: Defer to OT evaluation    Lower Extremity Assessment Lower Extremity Assessment: RLE deficits/detail;LLE deficits/detail;Difficult to assess due to impaired cognition RLE Deficits / Details: PROM ankle WFL; hip and knee limited by pain (grimacing and "ow") but unable to localize pain; no AROM observed, and leg in flexion, abdct, external rotation on arrival LLE  Deficits / Details: PROM WFL, no apparent pain    Cervical / Trunk Assessment Cervical / Trunk Assessment: Kyphotic  Communication   Communication Communication: Difficulty following commands/understanding;Difficulty communicating thoughts/reduced clarity of speech  Cognition Arousal: Stuporous Behavior During Therapy: Flat affect Overall Cognitive Status: Difficult to assess                                 General Comments: eyes closed 100% of the session, no attempt at answering questions or following commands; did state "ow" with RLE ROM        General Comments General comments (skin integrity, edema, etc.): donned bil prevalon boots at end of session    Exercises     Assessment/Plan    PT Assessment Patient needs continued PT services  PT Problem List Decreased strength;Decreased range of motion;Decreased activity tolerance;Decreased balance;Decreased mobility;Decreased knowledge of use of DME;Impaired tone;Pain       PT Treatment Interventions Functional mobility training;Therapeutic activities;Therapeutic exercise;Balance training;Neuromuscular re-education;Cognitive remediation;Patient/family education;Wheelchair mobility training    PT Goals (Current goals can be found in the Care Plan section)  Acute Rehab PT Goals Patient Stated Goal: unable to state PT Goal Formulation: Patient unable to participate in goal setting Time For Goal Achievement: 07/13/23 Potential to Achieve Goals: Poor    Frequency Min 1X/week     Co-evaluation PT/OT/SLP Co-Evaluation/Treatment: Yes Reason for Co-Treatment: Complexity of the patient's impairments (multi-system involvement);For patient/therapist safety;To address functional/ADL transfers PT goals addressed during session: Mobility/safety with mobility;Strengthening/ROM         AM-PAC PT "6 Clicks" Mobility  Outcome Measure Help needed turning from your back to your side while in a flat bed without using  bedrails?: Total Help needed moving from lying on your back to sitting on the side of a flat bed without using bedrails?: Total Help needed moving to and from a bed to a chair (including a wheelchair)?: Total Help needed standing up from a chair using your arms (e.g., wheelchair or bedside chair)?: Total Help needed to walk in hospital room?: Total Help needed climbing 3-5 steps with a railing? 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

## 2023-06-29 NOTE — Progress Notes (Signed)
Speech Language Pathology Treatment: Dysphagia  Patient Details Name: [name] MRN: [id number] DOB: 1955/07/13 Today's Date: 06/29/2023 Time: 4098-1191 SLP Time Calculation (min) (ACUTE ONLY): 13 min  Assessment / Plan / Recommendation Clinical Impression  Pt very lethargic today. Eyes remained closed throughout session with no verbal responses despite verbal and tactile stimulation. Based on prior SLP note, he is more lethargic today compared to yesterday. Pt does orally accept ice chips and small sips of water by straw. Mastication and oral manipulation of ice chips initially appeared efficient, though suspect some oral holding vs delayed pharyngeal swallow initiation. Observed x1 immediate cough response following ice chips x1/3 trials. Pt initiated x2 sips of thin liquids by straw with immediate coughing observed following x1/2 sips. No other PO trials prompted at this time due to pt's persistent lethargy.   Recommend continue NPO at this time. Consider non-oral nutrition/hydration/medication delivery as indicated.   Plan: SLP will follow up to continue clinical swallow re-assessment when pt is more alert to determine if diet can be advanced at bedside vs readiness for instrumental swallow assessment as indicated.    HPI HPI: Pt is a 68 yo male presenting with slurred speech and AMS. MRI revealed multiple scattered foci of acute infarct in the bilateral cerebral hemispheres. Pt has been seen multiple times by SLP in the past with dysphagia suspected to be primarily of esophageal origin. He has been on pureed diets up to mechanical soft diets, always with thin liquids. PMH includes: recent PNA (dx 1/7), stroke (2022), esophageal ca (2022), HH, CAD s/p CABG, combined CHF, ESRD, DMII, HTN, SVT, OSA, UGIB. Pt bed and w/c bound since hip fx last April and now resides at [facility].      SLP Plan  Continue with current plan of care      Recommendations for follow up therapy are one component of  a multi-disciplinary discharge planning process, led by the attending physician.  Recommendations may be updated based on patient status, additional functional criteria and insurance authorization.    Recommendations  Diet recommendations: NPO Medication Administration: Via alternative means                  Oral care QID   Frequent or constant Supervision/Assistance Dysphagia, unspecified (R13.10)     Continue with current plan of care     [name] Plunk  06/29/2023, 10:38 AM

## 2023-06-29 NOTE — Plan of Care (Signed)
  Problem: Nutrition: Goal: Risk of aspiration will decrease Outcome: Progressing Goal: Dietary intake will improve Outcome: Progressing   Problem: Education: Goal: Ability to describe self-care measures that may prevent or decrease complications (Diabetes Survival Skills Education) will improve Outcome: Progressing Goal: Individualized Educational Video(s) Outcome: Progressing   Problem: Coping: Goal: Ability to adjust to condition or change in health will improve Outcome: Progressing   Problem: Fluid Volume: Goal: Ability to maintain a balanced intake and output will improve Outcome: Progressing    Problem: Clinical Measurements: Goal: Ability to maintain clinical measurements within normal limits will improve Outcome: Progressing Goal: Will remain free from infection Outcome: Progressing Goal: Diagnostic test results will improve Outcome: Progressing Goal: Respiratory complications will improve Outcome: Progressing Goal: Cardiovascular complication will be avoided Outcome: Progressing   Problem: Elimination: Goal: Will not experience complications related to bowel motility Outcome: Progressing Goal: Will not experience complications related to urinary retention Outcome: Progressing   Problem: Skin Integrity: Goal: Risk for impaired skin integrity will decrease Outcome: Progressing

## 2023-06-29 NOTE — Progress Notes (Signed)
PROGRESS NOTE    ATWOOD WARDROP  ION:[id number] DOB: November 12, 1955 DOA: 06/27/2023 PCP: Babs Sciara, MD   Brief Narrative:  HPI: [name] is a 68 y.o. male with medical history significant of CAD s/p CABG, combined CHF, ESRD MWF, type 2 diabetes, hypertension, SVT, OSA, upper GI bleed who presented with slurred speech and AMS.   Ex-wife who is his caretaker provides history at bedside. Pt was sedated after receiving IV ativan for MRI.  Pt moved into SNF about 9 months ago after a hospitalization. He was living along prior to that. He fracture his right hip last April and had declined and is now mostly bedbound and wheelchair bound. Caretaker last saw him well about 6 days ago on 1/9. He was diagnosed with right sided pneumonia on 1/7. Last dialysis session was Thursday 1/9. Normally schedule is MWF but he reportedly refused it yesterday which is unlike him per caretaker.    On arrival to the ED, he had temperature of 97.30F, bradycardic with heart rate of 58, elevated BP of 153/64.   He presented as a code stroke and was reported to be disoriented to month, not following commands, bilateral arm weakness, bilateral leg weakness, aphasia, dysarthria on exam.    CT head negative. 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  Severe stenosis in the proximal right super clinoid ICA and moderate stenosis in the left supraclinoid ICA.  60% stenosis in the proximal right ICA severe stenosis at the origin of the right vertebral artery with additional severe stenosis in the right V2 segment.   Chest x-ray demonstrated small right pleural effusion with patchy opacity.  There is also streaky left basilar opacity.   CBC without leukocytosis, anemia stable with hemoglobin of 10.7.   BMP with creatinine of 5.66 with his previous ranging around 3-5   TSH elevated at 11.6 but was normal in September.  Assessment &  Plan:   Principal Problem:   Stroke [facility]) Active Problems:   Chronic combined systolic and diastolic CHF (congestive heart failure) (HCC)   HTN (hypertension)   Pleural effusion   Type 2 diabetes mellitus (HCC)   Obstructive sleep apnea   S/P CABG (coronary artery bypass graft)   ESRD (end stage renal disease) on dialysis (HCC)   Abnormal TSH   History of supraventricular tachycardia   Acute ischemic stroke (HCC)  Bilateral acute ischemic stroke (HCC) Pt presented with aphasia, generalized weakness from SNF -CTA head and neck demonstrated numerous areas of stenosis including 60% stenosis in the proximal right ICA. MRI brain shows Multiple scattered foci of acute infarct in the bilateral cerebral Hemispheres.  Patient started on aspirin and Plavix, hemoglobin A1c and lipid pending.  Neurology following.  PT OT SLP on board.  Still n.p.o., has not passed bedside swallow.  Neurology following.  Possible aspiration pneumonia/right pleural effusion which is trivial/possible UTI?: Chest x-ray shows right pleural effusion and atelectasis and patchy right basilar opacity/infiltrate.  Patient does not endorse any respiratory symptoms but he is encephalopathy as well.  No leukocytosis and he is afebrile but procalcitonin slightly elevated.  Cannot rule out aspiration pneumonia clinically.  Will continue Rocephin and Zithromax.  Urine culture in process.   Chronic systolic congestive heart failure: Latest echo done in September 2024 shows 35 to 40% ejection fraction.  Patient appears euvolemic.   HTN (hypertension) Blood pressure improved but still slightly elevated.  Has  been at least 48 hours since the stroke.  Will gradually reintroduce antihypertensives starting with Imdur.  He is already on Toprol-XL.  Hold hydralazine for now.  Reassess tomorrow morning.   Type 2 diabetes mellitus (HCC) - Uncontrolled.  Hemoglobin A1c of 8.1 in March and 6.4 this admission.  Continue SSI.   Obstructive  sleep apnea -not on CPAP but has nighttime O2 supplement   History of supraventricular tachycardia - Continue amiodarone and beta-blocker   Abnormal TSH -TSH elevated to 11 but was just normal in September.  On Synthroid 37.5 mcg, increased to 50 mcg.   ESRD (end stage renal disease) on dialysis [facility]) -scheduled MWF. Last session on Thursday prior to admission and then on Wednesday.  Appreciate nephrology help.     S/P CABG (coronary artery bypass graft) -continue aspirin   Goal of care: Patient was already at the nursing home and mostly bedbound due to previous strokes and multiple comorbidities, now has incomprehensible speech at times and unable to move extremities at all and failing swallow evaluation.  Carries very poor prognosis.  I have consulted palliative care to have goal of care conversation with next of kin.  I also had a conversation with patient's sister Amy DiStefano who happens to be ED physician.  She wanted to see if he responds, improves after hemodialysis and give him a day or 2 and in the meantime have conversation with other family members about goal of care.  DVT prophylaxis: heparin injection 5,000 Units Start: 06/27/23 2200   Code Status: Limited: Do not attempt resuscitation (DNR) -DNR-LIMITED -Do Not Intubate/DNI   Family Communication:  None present at bedside.    Status is: Inpatient Remains inpatient appropriate because: Failing swallow.     Estimated body mass index is 22.84 kg/m as calculated from the following:   Height as of this encounter: 5\' 10"  (1.778 m).   Weight as of this encounter: 72.2 kg.  Pressure Injury 06/28/23 Heel Left;Medial Stage 1 -  Intact skin with non-blanchable redness of a localized area usually over a bony prominence. (Active)  06/28/23 1530  Location: Heel  Location Orientation: Left;Medial  Staging: Stage 1 -  Intact skin with non-blanchable redness of a localized area usually over a bony prominence.  Wound Description  (Comments):   Present on Admission: Yes  Dressing Type None 06/28/23 2310     Pressure Injury 06/28/23 Ankle Right;Lateral Stage 1 -  Intact skin with non-blanchable redness of a localized area usually over a bony prominence. (Active)  06/28/23 1530  Location: Ankle  Location Orientation: Right;Lateral  Staging: Stage 1 -  Intact skin with non-blanchable redness of a localized area usually over a bony prominence.  Wound Description (Comments):   Present on Admission: Yes  Dressing Type None 06/28/23 2310     Pressure Injury 06/28/23 Coccyx Medial Stage 2 -  Partial thickness loss of dermis presenting as a shallow open injury with a red, pink wound bed without slough. (Active)  06/28/23 1530  Location: Coccyx  Location Orientation: Medial  Staging: Stage 2 -  Partial thickness loss of dermis presenting as a shallow open injury with a red, pink wound bed without slough.  Wound Description (Comments):   Present on Admission: Yes  Dressing Type Foam - Lift dressing to assess site every shift 06/28/23 2310   Nutritional Assessment: Body mass index is 22.84 kg/m.Marland Kitchen Seen by dietician.  I agree with the assessment and plan as outlined below: Nutrition Status:        .  Skin Assessment: I have examined the patient's skin and I agree with the wound assessment as performed by the wound care RN as outlined below: Pressure Injury 06/28/23 Heel Left;Medial Stage 1 -  Intact skin with non-blanchable redness of a localized area usually over a bony prominence. (Active)  06/28/23 1530  Location: Heel  Location Orientation: Left;Medial  Staging: Stage 1 -  Intact skin with non-blanchable redness of a localized area usually over a bony prominence.  Wound Description (Comments):   Present on Admission: Yes  Dressing Type None 06/28/23 2310     Pressure Injury 06/28/23 Ankle Right;Lateral Stage 1 -  Intact skin with non-blanchable redness of a localized area usually over a bony prominence.  (Active)  06/28/23 1530  Location: Ankle  Location Orientation: Right;Lateral  Staging: Stage 1 -  Intact skin with non-blanchable redness of a localized area usually over a bony prominence.  Wound Description (Comments):   Present on Admission: Yes  Dressing Type None 06/28/23 2310     Pressure Injury 06/28/23 Coccyx Medial Stage 2 -  Partial thickness loss of dermis presenting as a shallow open injury with a red, pink wound bed without slough. (Active)  06/28/23 1530  Location: Coccyx  Location Orientation: Medial  Staging: Stage 2 -  Partial thickness loss of dermis presenting as a shallow open injury with a red, pink wound bed without slough.  Wound Description (Comments):   Present on Admission: Yes  Dressing Type Foam - Lift dressing to assess site every shift 06/28/23 2310    Consultants:  Neurology  Procedures:  None  Antimicrobials:  Anti-infectives (From admission, onward)    Start     Dose/Rate Route Frequency Ordered Stop   06/28/23 0700  doxycycline (VIBRAMYCIN) 100 mg in dextrose 5 % 250 mL IVPB        100 mg 125 mL/hr over 120 Minutes Intravenous Every 12 hours 06/27/23 2028     06/27/23 1700  azithromycin (ZITHROMAX) 500 mg in sodium chloride 0.9 % 250 mL IVPB  Status:  Discontinued        500 mg 250 mL/hr over 60 Minutes Intravenous Every 24 hours 06/27/23 1650 06/27/23 2028   06/27/23 1700  cefTRIAXone (ROCEPHIN) 1 g in sodium chloride 0.9 % 100 mL IVPB        1 g 200 mL/hr over 30 Minutes Intravenous Every 24 hours 06/27/23 1650           Subjective: Patient seen and examined with nurse tech and OT in the room.  Patient too lethargic to wake up this morning but yawning and tried to talk/mumble.  Will give him some time, if remained lethargic for next few hours, may consider repeating CT head.  Neurology also following.  Objective: Vitals:   06/28/23 2158 06/28/23 2314 06/29/23 0350 06/29/23 0726  BP:  (!) 177/71 (!) 171/65 (!) 166/72  Pulse:  69 70  69  Resp:  18 18 17   Temp:  (!) 97.5 F (36.4 C) 99.3 F (37.4 C) 98.4 F (36.9 C)  TempSrc:  Oral Oral Oral  SpO2:  99% 97% 98%  Weight: 72.2 kg     Height:        Intake/Output Summary (Last 24 hours) at 06/29/2023 0816 Last data filed at 06/29/2023 0553 Gross per 24 hour  Intake 696.34 ml  Output 1900 ml  Net -1203.66 ml   Filed Weights   06/27/23 1551 06/28/23 1804 06/28/23 2158  Weight: 81.6 kg 74.8 kg 72.2 kg  Examination:  General exam: Appears calm and comfortable but lethargic today Respiratory system: Clear to auscultation. Respiratory effort normal. Cardiovascular system: S1 & S2 heard, RRR. No JVD, murmurs, rubs, gallops or clicks. No pedal edema. Gastrointestinal system: Abdomen is nondistended, soft and nontender. No organomegaly or masses felt. Normal bowel sounds heard. Central nervous system: Lethargic, disoriented, not following commands at the moment.   Data Reviewed: I have personally reviewed following labs and imaging studies  CBC: Recent Labs  Lab 06/27/23 1521 06/27/23 1540 06/28/23 1814  WBC  --  7.9 8.0  NEUTROABS  --  6.1  --   HGB 11.9* 10.7* 9.8*  HCT 35.0* 36.3* 32.4*  MCV  --  82.1 81.2  PLT  --  223 235   Basic Metabolic Panel: Recent Labs  Lab 06/27/23 1521 06/27/23 1540 06/28/23 1814  NA 136 136 136  K 4.0 4.1 4.0  CL 97* 96* 97*  CO2  --  25 20*  GLUCOSE 102* 101* 89  BUN 38* 39* 47*  CREATININE 5.80* 5.66* 6.30*  CALCIUM  --  8.8* 8.4*  PHOS  --   --  4.6   GFR: Estimated Creatinine Clearance: 11.8 mL/min (A) (by C-G formula based on SCr of 6.3 mg/dL (H)). Liver Function Tests: Recent Labs  Lab 06/27/23 1540 06/28/23 1814  AST 11*  --   ALT 7  --   ALKPHOS 91  --   BILITOT 1.1  --   PROT 6.3*  --   ALBUMIN 2.7* 2.6*   No results for input(s): "LIPASE", "AMYLASE" in the last 168 hours. No results for input(s): "AMMONIA" in the last 168 hours. Coagulation Profile: Recent Labs  Lab 06/27/23 1540  INR  1.1   Cardiac Enzymes: No results for input(s): "CKTOTAL", "CKMB", "CKMBINDEX", "TROPONINI" in the last 168 hours. BNP (last 3 results) No results for input(s): "PROBNP" in the last 8760 hours. HbA1C: Recent Labs    06/28/23 1144  HGBA1C 6.4*   CBG: Recent Labs  Lab 06/27/23 1502 06/27/23 1706 06/28/23 2323  GLUCAP 100* 112* 93   Lipid Profile: Recent Labs    06/28/23 1144  CHOL 155  HDL 57  LDLCALC 75  TRIG 114  CHOLHDL 2.7   Thyroid Function Tests: Recent Labs    06/27/23 1648  TSH 11.664*   Anemia Panel: No results for input(s): "VITAMINB12", "FOLATE", "FERRITIN", "TIBC", "IRON", "RETICCTPCT" in the last 72 hours. Sepsis Labs: Recent Labs  Lab 06/27/23 1548 06/27/23 1902 06/27/23 1919  PROCALCITON  --   --  0.35  LATICACIDVEN 1.3 1.1  --     Recent Results (from the past 240 hours)  Culture, blood (Routine X 2) w Reflex to ID Panel     Status: None (Preliminary result)   Collection Time: 06/27/23  4:48 PM   Specimen: BLOOD RIGHT ARM  Result Value Ref Range Status   Specimen Description BLOOD RIGHT ARM  Final   Special Requests   Final    BOTTLES DRAWN AEROBIC AND ANAEROBIC Blood Culture results may not be optimal due to an inadequate volume of blood received in culture bottles   Culture  Setup Time   Final    GRAM POSITIVE COCCI IN CLUSTERS IN BOTH AEROBIC AND ANAEROBIC BOTTLES CRITICAL RESULT CALLED TO, READ BACK BY AND VERIFIED WITH: PHARMD K. AMEND [phone] @2101  FH Performed at [facility], [address] [place], [place] [address]    Culture GRAM POSITIVE COCCI IN CLUSTERS  Final  Report Status PENDING  Incomplete  Blood Culture ID Panel (Reflexed)     Status: Abnormal   Collection Time: 06/27/23  4:48 PM  Result Value Ref Range Status   Enterococcus faecalis NOT DETECTED NOT DETECTED Final   Enterococcus Faecium NOT DETECTED NOT DETECTED Final   Listeria monocytogenes NOT DETECTED NOT DETECTED Final   Staphylococcus species  DETECTED (A) NOT DETECTED Final    Comment: CRITICAL RESULT CALLED TO, READ BACK BY AND VERIFIED WITH: PHARMD K. AMEND [phone] @2101  FH    Staphylococcus aureus (BCID) NOT DETECTED NOT DETECTED Final   Staphylococcus epidermidis NOT DETECTED NOT DETECTED Final   Staphylococcus lugdunensis NOT DETECTED NOT DETECTED Final   Streptococcus species NOT DETECTED NOT DETECTED Final   Streptococcus agalactiae NOT DETECTED NOT DETECTED Final   Streptococcus pneumoniae NOT DETECTED NOT DETECTED Final   Streptococcus pyogenes NOT DETECTED NOT DETECTED Final   A.calcoaceticus-baumannii NOT DETECTED NOT DETECTED Final   Bacteroides fragilis NOT DETECTED NOT DETECTED Final   Enterobacterales NOT DETECTED NOT DETECTED Final   Enterobacter cloacae complex NOT DETECTED NOT DETECTED Final   Escherichia coli NOT DETECTED NOT DETECTED Final   Klebsiella aerogenes NOT DETECTED NOT DETECTED Final   Klebsiella oxytoca NOT DETECTED NOT DETECTED Final   Klebsiella pneumoniae NOT DETECTED NOT DETECTED Final   Proteus species NOT DETECTED NOT DETECTED Final   Salmonella species NOT DETECTED NOT DETECTED Final   Serratia marcescens NOT DETECTED NOT DETECTED Final   Haemophilus influenzae NOT DETECTED NOT DETECTED Final   Neisseria meningitidis NOT DETECTED NOT DETECTED Final   Pseudomonas aeruginosa NOT DETECTED NOT DETECTED Final   Stenotrophomonas maltophilia NOT DETECTED NOT DETECTED Final   Candida albicans NOT DETECTED NOT DETECTED Final   Candida auris NOT DETECTED NOT DETECTED Final   Candida glabrata NOT DETECTED NOT DETECTED Final   Candida krusei NOT DETECTED NOT DETECTED Final   Candida parapsilosis NOT DETECTED NOT DETECTED Final   Candida tropicalis NOT DETECTED NOT DETECTED Final   Cryptococcus neoformans/gattii NOT DETECTED NOT DETECTED Final    Comment: Performed at [facility], [address], [place], [place] [address]  Culture, blood (Routine X 2) w Reflex to ID Panel     Status: None  (Preliminary result)   Collection Time: 06/27/23  6:57 PM   Specimen: BLOOD RIGHT HAND  Result Value Ref Range Status   Specimen Description BLOOD RIGHT HAND  Final   Special Requests   Final    BOTTLES DRAWN AEROBIC ONLY Blood Culture results may not be optimal due to an inadequate volume of blood received in culture bottles   Culture   Final    NO GROWTH 2 DAYS Performed at [facility], [address], [place], [place] [address]    Report Status PENDING  Incomplete  Resp panel by RT-PCR (RSV, Flu A&B, Covid) Anterior Nasal Swab     Status: None   Collection Time: 06/27/23  8:00 PM   Specimen: Anterior Nasal Swab  Result Value Ref Range Status   SARS Coronavirus 2 by RT PCR NEGATIVE NEGATIVE Final   Influenza A by PCR NEGATIVE NEGATIVE Final   Influenza B by PCR NEGATIVE NEGATIVE Final    Comment: (NOTE) The Xpert Xpress SARS-CoV-2/FLU/RSV plus assay is intended as an aid in the diagnosis of influenza from Nasopharyngeal swab specimens and should not be used as a sole basis for treatment. Nasal washings and aspirates are unacceptable for Xpert Xpress SARS-CoV-2/FLU/RSV testing.  Fact Sheet for  Patients: BloggerCourse.com  Fact Sheet for Healthcare Providers: SeriousBroker.it  This test is not yet approved or cleared by the [place] FDA and has been authorized for detection and/or diagnosis of SARS-CoV-2 by FDA under an Emergency Use Authorization (EUA). This EUA will remain in effect (meaning this test can be used) for the duration of the COVID-19 declaration under Section 564(b)(1) of the Act, 21 U.S.C. section 360bbb-3(b)(1), unless the authorization is terminated or revoked.     Resp Syncytial Virus by PCR NEGATIVE NEGATIVE Final    Comment: (NOTE) Fact Sheet for Patients: BloggerCourse.com  Fact Sheet for Healthcare Providers: SeriousBroker.it  This test  is not yet approved or cleared by the [place] FDA and has been authorized for detection and/or diagnosis of SARS-CoV-2 by FDA under an Emergency Use Authorization (EUA). This EUA will remain in effect (meaning this test can be used) for the duration of the COVID-19 declaration under Section 564(b)(1) of the Act, 21 U.S.C. section 360bbb-3(b)(1), unless the authorization is terminated or revoked.  Performed at [facility], [address], [place], [place] [address]      Radiology Studies: ECHOCARDIOGRAM COMPLETE Result Date: 06/28/2023    ECHOCARDIOGRAM REPORT   Patient Name:   [name] Date of Exam: 06/28/2023 Medical Rec #:  [id number]        Height:       70.0 in Accession #:    [id number]       Weight:       180.0 lb Date of Birth:  12-08-1955        BSA:          1.996 m Patient Age:    67 years         BP:           161/72 mmHg Patient Gender: M                HR:           64 bpm. Exam Location:  Inpatient Procedure: 2D Echo, Color Doppler and Cardiac Doppler Indications:    Stroke  History:        Patient has prior history of Echocardiogram examinations, most                 recent 02/14/2023. Stroke.  Sonographer:    Harriette Bouillon RDCS Referring Phys: [id number] CHING T TU IMPRESSIONS  1. Left ventricular ejection fraction, by estimation, is 45 to 50%. The left ventricle has mildly decreased function. The left ventricle demonstrates regional wall motion abnormalities (see scoring diagram/findings for description). The left ventricular  internal cavity size was mildly dilated. There is mild left ventricular hypertrophy. Left ventricular diastolic parameters are consistent with Grade II diastolic dysfunction (pseudonormalization). Elevated left atrial pressure.  2. Right ventricular systolic function is mildly reduced. The right ventricular size is mildly enlarged. Tricuspid regurgitation signal is inadequate for assessing PA pressure.  3. Left atrial size was moderately dilated.   4. Right atrial size was moderately dilated.  5. The mitral valve is degenerative. Trivial mitral valve regurgitation. No evidence of mitral stenosis. Severe mitral annular calcification.  6. The aortic valve is tricuspid. Aortic valve regurgitation is not visualized. No aortic stenosis is present.  7. The inferior vena cava is dilated in size with <50% respiratory variability, suggesting right atrial pressure of 15 mmHg. FINDINGS  Left Ventricle: Left ventricular ejection fraction, by estimation, is 45 to 50%. The left ventricle has mildly decreased function. The left  ventricle demonstrates regional wall motion abnormalities. The left ventricular internal cavity size was mildly dilated. There is mild left ventricular hypertrophy. Left ventricular diastolic parameters are consistent with Grade II diastolic dysfunction (pseudonormalization). Elevated left atrial pressure.  LV Wall Scoring: The basal inferolateral segment and basal inferior segment are akinetic. The entire anterior wall, antero-lateral wall, mid and distal lateral wall, entire septum, entire apex, and mid and distal inferior wall are normal. Right Ventricle: The right ventricular size is mildly enlarged. Right vetricular wall thickness was not well visualized. Right ventricular systolic function is mildly reduced. Tricuspid regurgitation signal is inadequate for assessing PA pressure. Left Atrium: Left atrial size was moderately dilated. Right Atrium: Right atrial size was moderately dilated. Pericardium: There is no evidence of pericardial effusion. Mitral Valve: The mitral valve is degenerative in appearance. Severe mitral annular calcification. Trivial mitral valve regurgitation. No evidence of mitral valve stenosis. Tricuspid Valve: The tricuspid valve is normal in structure. Tricuspid valve regurgitation is trivial. Aortic Valve: The aortic valve is tricuspid. Aortic valve regurgitation is not visualized. No aortic stenosis is present. Pulmonic  Valve: The pulmonic valve was grossly normal. Pulmonic valve regurgitation is trivial. Aorta: The aortic root and ascending aorta are structurally normal, with no evidence of dilitation. Venous: The inferior vena cava is dilated in size with less than 50% respiratory variability, suggesting right atrial pressure of 15 mmHg. IAS/Shunts: The interatrial septum was not well visualized.  LEFT VENTRICLE PLAX 2D LVIDd:         5.90 cm   Diastology LVIDs:         5.30 cm   LV e' medial:    5.98 cm/s LV PW:         1.20 cm   LV E/e' medial:  21.6 LV IVS:        1.10 cm   LV e' lateral:   7.62 cm/s LVOT diam:     2.20 cm   LV E/e' lateral: 16.9 LV SV:         87 LV SV Index:   44 LVOT Area:     3.80 cm  RIGHT VENTRICLE         IVC TAPSE (M-mode): 1.1 cm  IVC diam: 3.40 cm LEFT ATRIUM              Index        RIGHT ATRIUM           Index LA diam:        5.70 cm  2.86 cm/m   RA Area:     26.90 cm LA Vol (A2C):   72.8 ml  36.47 ml/m  RA Volume:   89.30 ml  44.74 ml/m LA Vol (A4C):   101.0 ml 50.60 ml/m LA Biplane Vol: 89.1 ml  44.64 ml/m  AORTIC VALVE LVOT Vmax:   106.00 cm/s LVOT Vmean:  70.400 cm/s LVOT VTI:    0.230 m  AORTA Ao Root diam: 3.40 cm Ao Asc diam:  3.70 cm MITRAL VALVE MV Area (PHT): 4.40 cm     SHUNTS MV E velocity: 129.00 cm/s  Systemic VTI:  0.23 m MV A velocity: 82.00 cm/s   Systemic Diam: 2.20 cm MV E/A ratio:  1.57 Epifanio Lesches MD Electronically signed by Epifanio Lesches MD Signature Date/Time: 06/28/2023/1:08:43 PM    Final    MR BRAIN WO CONTRAST Result Date: 06/27/2023 CLINICAL DATA:  Stroke, follow up EXAM: MRI HEAD WITHOUT CONTRAST TECHNIQUE: Multiplanar, multiecho pulse sequences of the brain  and surrounding structures were obtained without intravenous contrast. COMPARISON:  CT Head 06/27/23 FINDINGS: Brain: There are multiple scattered foci of acute infarct in the bilateral cerebral hemispheres, including the right parietal lobe (series 5, image 89), in the left frontal lobe  (series 5, image 93), right frontal lobe (series 5, image 79), and left parietal lobe (series 5, image 79). There are multiple scattered foci of microhemorrhage, predominantly in a central distribution. There is a background of moderate to severe chronic microvascular ischemic change. Vascular: Normal flow voids. Skull and upper cervical spine: Normal marrow signal. Sinuses/Orbits: Trace left mastoid effusion. No middle ear effusion. Paranasal sinuses are clear. Bilateral lens replacement. Orbits are otherwise unremarkable. Other: None. IMPRESSION: Multiple scattered foci of acute infarct in the bilateral cerebral hemispheres, as described above. No hemorrhage or mass effect. Given distribution, these are favored to be secondary to a central embolic etiology. Findings were paged to Dr. Amada Jupiter on 06/27/23 at 6:29 PM. Electronically Signed   By: Lorenza Cambridge M.D.   On: 06/27/2023 18:29   DG Chest Port 1 View Result Date: 06/27/2023 CLINICAL DATA:  Shortness of breath. EXAM: PORTABLE CHEST 1 VIEW COMPARISON:  Chest radiograph dated May 31, 2023. FINDINGS: Mild cardiomegaly, unchanged. Prior median sternotomy and CABG. Small right pleural effusion with right basilar atelectasis and patchy opacities, similar to slightly increased compared to the prior exam. Streaky left basilar opacities. No pneumothorax. No acute osseous abnormality. Overlapping telemetry wires. IMPRESSION: 1. Small right pleural effusion with right basilar atelectasis, similar to slightly increased compared to the prior exam. Patchy right basilar opacities may reflect atelectasis or infiltrate. 2. Streaky left basilar opacities could reflect atelectasis or infiltrate. Electronically Signed   By: Hart Robinsons M.D.   On: 06/27/2023 16:26   CT ANGIO HEAD NECK W WO CM W PERF (CODE STROKE) Result Date: 06/27/2023 CLINICAL DATA:  Altered mental status, slurred speech, stroke suspected EXAM: CT ANGIOGRAPHY HEAD AND NECK CT PERFUSION BRAIN  TECHNIQUE: Multidetector CT imaging of the head and neck was performed using the standard protocol during bolus administration of intravenous contrast. Multiplanar CT image reconstructions and MIPs were obtained to evaluate the vascular anatomy. Carotid stenosis measurements (when applicable) are obtained utilizing NASCET criteria, using the distal internal carotid diameter as the denominator. Multiphase CT imaging of the brain was performed following IV bolus contrast injection. Subsequent parametric perfusion maps were calculated using RAPID software. RADIATION DOSE REDUCTION: This exam was performed according to the departmental dose-optimization program which includes automated exposure control, adjustment of the mA and/or kV according to patient size and/or use of iterative reconstruction technique. CONTRAST:  OMNIPAQUE IOHEXOL 350 MG/ML SOLN COMPARISON:  No prior CTA available, correlation is made with 06/27/2023 CT stroke code and MRA head 12/14/2020 FINDINGS: CT HEAD FINDINGS For noncontrast findings, please see same day CT head. CTA NECK FINDINGS Aortic arch: Standard branching. Imaged portion shows no evidence of aneurysm or dissection. No significant stenosis of the major arch vessel origins. Aortic atherosclerosis. Right carotid system: 60% stenosis in the proximal right ICA (series 6, image 209). No evidence of dissection. Left carotid system: No evidence of dissection, occlusion, or hemodynamically significant stenosis (greater than 50%). Vertebral arteries: Severe stenosis at the origin of the right vertebral artery (series 6, image 284), with additional severe stenosis in the right V2 segment (series 6, image 210). The left vertebral artery is patent from its origin to the skull base without significant stenosis. No evidence of dissection. Skeleton: No acute osseous abnormality. Degenerative  changes in the cervical spine. Other neck: No acute finding. Upper chest: Moderate right and trace left  pleural effusions. Review of the MIP images confirms the above findings CTA HEAD FINDINGS Anterior circulation: Both internal carotid arteries are patent to the termini, with severe stenosis in the proximal right supraclinoid ICA (series 6, image 113) and moderate stenosis in left supraclinoid ICA (series 6, images 107-108). A1 segments patent, with mild stenosis in the distal right A1. Normal anterior communicating artery. Anterior cerebral arteries are patent to their distal aspects without significant stenosis. No M1 stenosis or occlusion. MCA branches perfused to their distal aspects without significant stenosis. Posterior circulation: Severe stenosis in the right V4 just past the dural reflection (series 6, image 166), with an additional segment of severe stenosis in the proximal to mid V4 (series 6, image 150-159), and complete occlusion of the mid to distal right V4 (series 6, images 140-144). The proximal stenosis in mid to distal occlusion appears similar to the prior MRA from 2022. The left vertebral artery is dominant and patent to the vertebrobasilar junction without significant stenosis. Posterior inferior cerebellar arteries patent proximally. Basilar patent to its distal aspect without significant stenosis. Superior cerebellar arteries patent proximally. Patent P1 segments. PCAs perfused to their distal aspects without significant stenosis. The bilateral posterior communicating arteries are not visualized. Venous sinuses: As permitted by contrast timing, patent. Anatomic variants: None significant. No evidence of aneurysm or vascular malformation. Review of the MIP images confirms the above findings CT Brain Perfusion Findings: ASPECTS: 10 CBF (<30%) Volume: 0mL Perfusion (Tmax>6.0s) volume: 13mL Mismatch Volume: 13mL Infarction Location:No infarct core detected. Area of possible penumbra in the superior right cerebellum, favored to be artifactual. IMPRESSION: 1. Complete occlusion of the mid to  distal right V4 and severe stenosis in the more proximal right V4, which were likely present on the 2022 MRA. 2. Severe stenosis in the proximal right supraclinoid ICA and moderate stenosis in the left supraclinoid ICA. 3. 60% stenosis in the proximal right ICA. 4. Severe stenosis at the origin of the right vertebral artery, with additional severe stenosis in the right V2 segment. 5. No infarct core detected. Area of possible penumbra in the superior right cerebellum, favored to be artifactual. 6. Moderate right and trace left pleural effusions. 7. Aortic atherosclerosis. Aortic Atherosclerosis (ICD10-I70.0). Imaging results were communicated on 06/27/2023 at 3:49 pm to provider Dr. Amada Jupiter via secure text paging. Electronically Signed   By: Wiliam Ke M.D.   On: 06/27/2023 15:49   CT HEAD CODE STROKE WO CONTRAST Result Date: 06/27/2023 CLINICAL DATA:  Code stroke.  Slurred speech EXAM: CT HEAD WITHOUT CONTRAST TECHNIQUE: Contiguous axial images were obtained from the base of the skull through the vertex without intravenous contrast. RADIATION DOSE REDUCTION: This exam was performed according to the departmental dose-optimization program which includes automated exposure control, adjustment of the mA and/or kV according to patient size and/or use of iterative reconstruction technique. COMPARISON:  05/31/23 CT head. FINDINGS: Brain: No hemorrhage. No hydrocephalus. No extra-axial fluid collection. No CT evidence of an acute cortical infarct. No mass effect. No mass lesion. There is background of moderate chronic microvascular ischemic change. Generalized volume loss. Vascular: No hyperdense vessel or unexpected calcification. Skull: Normal. Negative for fracture or focal lesion. Sinuses/Orbits: Middle ear or mastoid effusion. Paranasal sinuses are notable for frothy secretions in the right sphenoid sinus. Bilateral lens replacement. Orbits are otherwise unremarkable. Other: No ASPECTS ([place] Stroke Program  Early CT Score): 10 IMPRESSION: No hemorrhage or  CT evidence of an acute cortical infarct. Aspects is 10. Findings were paged to Dr. Amada Jupiter on 06/27/23 at 3:21 PM. Electronically Signed   By: Lorenza Cambridge M.D.   On: 06/27/2023 15:21    Scheduled Meds:   stroke: early stages of recovery book   Does not apply Once   amiodarone  200 mg Oral BID   aspirin EC  81 mg Oral Daily   calcium acetate  667 mg Oral 3 times per day on Sunday Tuesday Thursday Saturday   And   calcium acetate  667 mg Oral 2 times per day on Monday Wednesday Friday   clopidogrel  300 mg Oral Once   clopidogrel  75 mg Oral Daily   heparin  5,000 Units Subcutaneous Q8H   isosorbide mononitrate  30 mg Oral Daily   levothyroxine  50 mcg Oral Daily   metoprolol succinate  50 mg Oral Daily   pantoprazole  40 mg Oral BID   sertraline  100 mg Oral Daily   sodium chloride flush  3 mL Intravenous Once   Continuous Infusions:  cefTRIAXone (ROCEPHIN)  IV Stopped (06/28/23 2349)   doxycycline (VIBRAMYCIN) IV Stopped (06/29/23 0227)     LOS: 1 day   Hughie Closs, MD [facility]  06/29/2023, 8:16 AM   *Please note that this is a verbal dictation therefore any spelling or grammatical errors are due to the "Dragon Medical One" system interpretation.  Please page via Amion and do not message via secure chat for urgent patient care matters. Secure chat can be used for non urgent patient care matters.  How to contact the [facility] Attending or Consulting provider 7A - 7P or covering provider during after hours 7P -7A, for this patient?  Check the care team in [facility] and look for a) attending/consulting TR[facility] provider listed and b) the [facility] team listed. Page or secure chat 7A-7P. Log into [email or web address] and use [place]'s universal password to access. If you do not have the password, please contact the hospital operator. Locate the [facility] provider you are looking for under [facility] and page to a number that you can be  directly reached. If you still have difficulty reaching the provider, please page the [facility] (Director on Call) for the Hospitalists listed on amion for assistance.

## 2023-06-29 NOTE — Plan of Care (Signed)
  Problem: Education: Goal: Knowledge of disease or condition will improve Outcome: Progressing Goal: Knowledge of secondary prevention will improve (MUST DOCUMENT ALL) Outcome: Progressing Goal: Knowledge of patient specific risk factors will improve Elta [place] N/A or DELETE if not current risk factor) Outcome: Progressing   Problem: Ischemic Stroke/TIA Tissue Perfusion: Goal: Complications of ischemic stroke/TIA will be minimized Outcome: Progressing   Problem: Coping: Goal: Will verbalize positive feelings about self Outcome: Progressing Goal: Will identify appropriate support needs Outcome: Progressing

## 2023-06-29 NOTE — Progress Notes (Signed)
STROKE TEAM PROGRESS NOTE   BRIEF HPI [name] is a 68 y.o. male with history of stage renal disease on dialysis, diabetes, stroke, CAD, SVT and heart failure who lives in a skilled nursing facility presenting with acute onset altered mental status.  He was noted to have missed several dialysis treatments and also has aspiration pneumonia.  Brain MRI demonstrates multiple punctate embolic appearing infarcts,    NIH on Admission 16  INTERIM HISTORY/SUBJECTIVE Patient has remained hemodynamically stable overnight.    He remains drowsy on exam and encephalopathic with mild asterixis.  He follows commands and moves all 4 extremities.  OBJECTIVE  CBC    Component Value Date/Time   WBC 8.0 06/28/2023 1814   RBC 3.99 (L) 06/28/2023 1814   HGB 9.8 (L) 06/28/2023 1814   HGB 9.0 (L) 11/01/2021 1439   HCT 32.4 (L) 06/28/2023 1814   HCT 28.1 (L) 11/01/2021 1439   PLT 235 06/28/2023 1814   PLT 254 11/01/2021 1439   MCV 81.2 06/28/2023 1814   MCV 88 11/01/2021 1439   MCH 24.6 (L) 06/28/2023 1814   MCHC 30.2 06/28/2023 1814   RDW 19.4 (H) 06/28/2023 1814   RDW 15.9 (H) 11/01/2021 1439   LYMPHSABS 0.9 06/27/2023 1540   LYMPHSABS 0.9 10/07/2021 1202   MONOABS 0.7 06/27/2023 1540   EOSABS 0.1 06/27/2023 1540   EOSABS 0.2 10/07/2021 1202   BASOSABS 0.1 06/27/2023 1540   BASOSABS 0.1 10/07/2021 1202    BMET    Component Value Date/Time   NA 136 06/28/2023 1814   NA 138 10/07/2021 1202   K 4.0 06/28/2023 1814   CL 97 (L) 06/28/2023 1814   CO2 20 (L) 06/28/2023 1814   GLUCOSE 89 06/28/2023 1814   BUN 47 (H) 06/28/2023 1814   BUN 42 (H) 10/07/2021 1202   CREATININE 6.30 (H) 06/28/2023 1814   CREATININE 1.94 (H) 05/13/2019 1029   CALCIUM 8.4 (L) 06/28/2023 1814   EGFR 22 (L) 10/07/2021 1202   GFRNONAA 9 (L) 06/28/2023 1814    IMAGING past 24 hours No results found.   Vitals:   06/28/23 2314 06/29/23 0350 06/29/23 0726 06/29/23 1134  BP: (!) 177/71 (!) 171/65 (!)  166/72 (!) 144/74  Pulse: 69 70 69 68  Resp: 18 18 17 17   Temp: (!) 97.5 F (36.4 C) 99.3 F (37.4 C) 98.4 F (36.9 C) 98.4 F (36.9 C)  TempSrc: Oral Oral Oral Oral  SpO2: 99% 97% 98% 100%  Weight:      Height:         PHYSICAL EXAM General: Drowsy, chronically ill-appearing patient in no acute distress Psych:  Mood and affect appropriate for situation CV: Regular rate and rhythm on monitor Respiratory:  Regular, unlabored respirations on room air Asterixis noted to bilateral upper extremities  NEURO:  Mental Status: Drowsy, responds to name, able to state correct city but not able to state where he is or describe his situation Speech/Language: speech is mildly dysarthric and in single words or short phrases  Cranial Nerves:  II: PERRL.  III, IV, VI: EOMI.  V: Sensation is intact to light touch and symmetrical to face.  VII: Subtle right facial droop VIII: hearing intact to voice. IX, X: Voice is slightly dysarthric XII: tongue is midline without fasciculations. Motor: Able to move bilateral upper extremities with antigravity strength, moves lower extremities but does not lift them off the bed.   Mild action tremor and slight asterixis present.  No myoclonus Tone:  is normal and bulk is normal Sensation- Intact to light touch in upper extremities, intact to noxious in lower extremities Coordination: Unable to perform Reflexes-brisk patellar reflexes, positive jaw jerk Gait- deferred      ASSESSMENT/PLAN  Acute Ischemic Infarct: Scattered punctate ischemic strokes in bilateral hemispheres Etiology: Likely embolic likely from calcific mitral valve versus paroxysmal A-fib Code Stroke CT head No acute abnormality. ASPECTS 10.    CTA head & neck complete occlusion of mid to distal right V4 and severe stenosis in proximal right V4, severe stenosis in proximal right supraclinoid ICA and moderate stenosis in left supraclinoid ICA, 60% stenosis in proximal right ICA, severe  gnosis of the origin of the right vertebral artery and V2 segment CT perfusion no infarct core or penumbra detected MRI multiple foci of scattered acute infarcts in bilateral cerebral hemispheres 2D Echo ejection fraction 45 to 50%.  Left atrium moderately dilated severe mitral annulus calcification LDL 24 HgbA1c 8.1 VTE prophylaxis -subcutaneous heparin No antithrombotic prior to admission, now on aspirin 81 mg daily and clopidogrel 75 mg daily for 3 weeks and then aspirin alone. Therapy recommendations:  Pending Disposition: Pending  Hypertension Home meds: Hydralazine 25 mg every 8 hours, metoprolol 50 mg daily Stable Blood Pressure Goal: BP less than 220/110   Hyperlipidemia Home meds: Repatha 140 mg every 2 weeks LDL 24, goal < 70 High intensity statin not indicated due to previous intolerance Continue statin at discharge  Diabetes type II Uncontrolled Home meds: Insulin aspart sliding scale HgbA1c 8.1, goal < 7.0 CBGs SSI Recommend close follow-up with PCP for better DM control  Dysphagia Patient has post-stroke dysphagia, SLP consulted    Diet   Diet NPO time specified   Advance diet as tolerated  End-stage renal disease on dialysis Patient is usually on Monday Wednesday Friday dialysis schedule but did refuse treatment while at facility Resume dialysis while inpatient Avoid contrast when able Renally dose medications as appropriate  Other Stroke Risk Factors Obesity, Body mass index is 22.84 kg/m., BMI >/= 30 associated with increased stroke risk, recommend weight loss, diet and exercise as appropriate  Coronary artery disease Congestive heart failure   Other Active Problems Community-acquired pneumonia-continue Rocephin Urinary tract infection-continue Rocephin SVT-continue home amiodarone  Hospital day # 1    Patient presented with altered mental status which is likely multifactorial given significant renal failure and encephalopathy but MRI does  show by cerebral tiny punctate embolic infarcts which could be from paroxysmal A-fib versus calcific mitral.  Recommend dual antiplatelet therapy aspirin Plavix for 3 weeks followed by aspirin alone and aggressive risk factor modification.  Recommend outpatient 30-day cardiac monitoring at discharge to look for A-fib.  No family at the bedside.  Discussed with Dr. Daleen Bo.  Stroke team will sign off.  Kindly call for questions greater than 50% time during this 25-minute visit was spent on counseling and coordination of care and discussion with patient and care team and answering questions.  Delia Heady, MD Medical Director [facility] St[facility] Pager: [phone] 06/29/2023 2:08 PM  To contact Stroke Continuity provider, please refer to [email or web address]. After hours, contact General Neurology

## 2023-06-29 NOTE — Progress Notes (Signed)
Admit: 06/27/2023 LOS: 1  35M ESRD s/p CVA  Subjective:  HD overnight  Remains aphasic, awake but not tracking  01/15 0701 - 01/16 0700 In: 696.3 [IV Piggyback:696.3] Out: 1900   Filed Weights   06/27/23 1551 06/28/23 1804 06/28/23 2158  Weight: 81.6 kg 74.8 kg 72.2 kg    Scheduled Meds:   stroke: early stages of recovery book   Does not apply Once   amiodarone  200 mg Oral BID   aspirin EC  81 mg Oral Daily   calcium acetate  667 mg Oral 3 times per day on Sunday Tuesday Thursday Saturday   And   calcium acetate  667 mg Oral 2 times per day on Monday Wednesday Friday   clopidogrel  300 mg Oral Once   clopidogrel  75 mg Oral Daily   heparin  5,000 Units Subcutaneous Q8H   isosorbide mononitrate  30 mg Oral Daily   levothyroxine  50 mcg Oral Daily   metoprolol succinate  50 mg Oral Daily   pantoprazole  40 mg Oral BID   sertraline  100 mg Oral Daily   sodium chloride flush  3 mL Intravenous Once   Continuous Infusions:  cefTRIAXone (ROCEPHIN)  IV Stopped (06/28/23 2349)   doxycycline (VIBRAMYCIN) IV Stopped (06/29/23 0227)   PRN Meds:.acetaminophen **OR** acetaminophen (TYLENOL) oral liquid 160 mg/5 mL **OR** acetaminophen, morphine injection, senna-docusate  Current Labs: reviewed    Physical Exam:  Blood pressure (!) 166/72, pulse 69, temperature 98.4 F (36.9 C), temperature source Oral, resp. rate 17, height 5\' 10"  (1.778 m), weight 72.2 kg, SpO2 98%. General: Lying in bed, unresponsive  Head: NCAT sclera not icteric MMM Neck: Supple. Lungs: Clear bilaterally. Normal WOB Heart: RRR, no murmur, rub, or gallop  Abdomen: soft non-tender no masses  Lower extremities:without edema  Neuro: Obtunded Psych:  Unresponsive Dialysis Access: L AVF   Dialysis Orders:  [facility] MWF 4:00 400/600 EDW 75.3kg 2K/2.5Ca AVF Hep 5000 Last HD 1/10.  Mircera 200 q 2wks (last 06/16/23) Hectorol 1mg  TIW   Assessment/Plan: Acute CVA - multiple scattered infarcts on MRI. W/u in  progress. Per neurology  Acute encephalopathy - likely multifactorial, not improved  ESRD -  HD MWF. Cont on schedule using AVF  Hypertension/volume  - Permissive HTN per neuro. No gross volume overload   Anemia  - Hgb 9.8. On ESA as outpatient -- order today  Metabolic bone disease -  Calcium and P acceptable. Continue home meds when tolerating po  H/o SVT - on amiodarone  T2DM - per primary      Medication Issues; Preferred narcotic agents for pain control are hydromorphone, fentanyl, and methadone. Morphine should not be used.  Baclofen should be avoided Avoid oral sodium phosphate and magnesium citrate based laxatives / bowel preps   Arita Miss, MD  [facility] 06/28/2023, 12:45 PM   Sabra Heck MD 06/29/2023, 10:03 AM  Recent Labs  Lab 06/27/23 1521 06/27/23 1540 06/28/23 1814  NA 136 136 136  K 4.0 4.1 4.0  CL 97* 96* 97*  CO2  --  25 20*  GLUCOSE 102* 101* 89  BUN 38* 39* 47*  CREATININE 5.80* 5.66* 6.30*  CALCIUM  --  8.8* 8.4*  PHOS  --   --  4.6   Recent Labs  Lab 06/27/23 1521 06/27/23 1540 06/28/23 1814  WBC  --  7.9 8.0  NEUTROABS  --  6.1  --   HGB 11.9* 10.7* 9.8*  HCT 35.0* 36.3*  32.4*  MCV  --  82.1 81.2  PLT  --  223 235

## 2023-06-29 NOTE — Progress Notes (Signed)
Palliative:  HPI:  68 y.o. male  with past medical history of stroke, CAD s/p CABG, HFmrEF, ESRD on HD MWF, diabetes, HTN, SVT, OSA, upper GIB, esophageal cancer 2022, right eye vision loss, s/p hip fracture s/p repair admitted on 06/27/2023 with slurred speech and AMS due to stroke, uremia (missed HD), and new stroke.    I met today again with [name]. He does look at me if I am in directly in his line of sight. He does not track or turn head towards voice. He does seem to be able to keep eyes open slightly longer than yesterday but overall still extremely lethargic. He does try and cough to command but very weak and ineffective. Poor control of secretions. He does answer some yes/no questions and follow some simple commands but inconsistently. I reviewed in brief that he is in the hospital and has had a stroke and his swallowing is very weak. I did ask him if he would ever want a feeding tube and he nodded head "no." I attempted to ask if he was tired of doing dialysis but no answer. He became tired and less able to get him to open his eyes. No significant changes from admission. No verbal response.   I called and reviewed with sister/HCPOA, Amy Campanella. I reviewed my conversation and assessment. Amy has spoken with their family and prompted them that we may be at the point of comfort/hospice. She appreciates that he was able to confirm desire for no feeding tube per his previous wishes. We reviewed in detail hospice facility vs hospice at home. She would like to complete another dialysis treatment before making a final decision. If they decide to take him home with hospice they may desire an additional dialysis treatment to optimize him in preparation to take him home to try and allow him best chance of knowing where he is. Family will have ongoing discussions. I have asked my colleague to follow up again with Amy tomorrow afternoon (hopefully after HD) to reassess for any changes/improvement.   All  questions/concerns addressed. Emotional support provided. Updated medical team and SLP.   Exam: Arouses to voice but briefly. No distress. Poor management of oral secretions. Non-verbal. No distress. Breathing regular, unlabored. Abd soft. Generalized weakness.   Plan: - DNR/DNI - No plans for feeding tube - Reassess goals after repeat HD treatment - HCPOA considering hospice/comfort care  40 min  Yong Channel, NP Palliative Medicine Team Pager [phone] (Please see [email or web address] for schedule) Team Phone [phone]

## 2023-06-29 NOTE — Progress Notes (Signed)
Pt receives out-pt HD at [facility] on MWF. Will assist as needed.   Cedricka Sackrider Renal Navigator [phone] 

## 2023-06-30 DIAGNOSIS — R1310 Dysphagia, unspecified: Secondary | ICD-10-CM

## 2023-06-30 DIAGNOSIS — I639 Cerebral infarction, unspecified: Secondary | ICD-10-CM | POA: Diagnosis not present

## 2023-06-30 DIAGNOSIS — R7881 Bacteremia: Secondary | ICD-10-CM

## 2023-06-30 DIAGNOSIS — L8990 Pressure ulcer of unspecified site, unspecified stage: Secondary | ICD-10-CM | POA: Insufficient documentation

## 2023-06-30 DIAGNOSIS — Z7189 Other specified counseling: Secondary | ICD-10-CM | POA: Diagnosis not present

## 2023-06-30 DIAGNOSIS — N186 End stage renal disease: Secondary | ICD-10-CM | POA: Diagnosis not present

## 2023-06-30 LAB — RENAL FUNCTION PANEL
Albumin: 2.5 g/dL — ABNORMAL LOW (ref 3.5–5.0)
Anion gap: 16 — ABNORMAL HIGH (ref 5–15)
BUN: 33 mg/dL — ABNORMAL HIGH (ref 8–23)
CO2: 23 mmol/L (ref 22–32)
Calcium: 8.2 mg/dL — ABNORMAL LOW (ref 8.9–10.3)
Chloride: 98 mmol/L (ref 98–111)
Creatinine, Ser: 5.03 mg/dL — ABNORMAL HIGH (ref 0.61–1.24)
GFR, Estimated: 12 mL/min — ABNORMAL LOW (ref >60)
Glucose, Bld: 72 mg/dL (ref 70–99)
Phosphorus: 4.3 mg/dL (ref 2.5–4.6)
Potassium: 3.5 mmol/L (ref 3.5–5.1)
Sodium: 137 mmol/L (ref 135–145)

## 2023-06-30 LAB — GLUCOSE, CAPILLARY
Glucose-Capillary: 76 mg/dL (ref 70–99)
Glucose-Capillary: 77 mg/dL (ref 70–99)

## 2023-06-30 LAB — CBC
HCT: 33.7 % — ABNORMAL LOW (ref 39.0–52.0)
Hemoglobin: 10.2 g/dL — ABNORMAL LOW (ref 13.0–17.0)
MCH: 24.6 pg — ABNORMAL LOW (ref 26.0–34.0)
MCHC: 30.3 g/dL (ref 30.0–36.0)
MCV: 81.4 fL (ref 80.0–100.0)
Platelets: 247 10*3/uL (ref 150–400)
RBC: 4.14 MIL/uL — ABNORMAL LOW (ref 4.22–5.81)
RDW: 19.1 % — ABNORMAL HIGH (ref 11.5–15.5)
WBC: 7 10*3/uL (ref 4.0–10.5)
nRBC: 0 % (ref 0.0–0.2)

## 2023-06-30 LAB — VANCOMYCIN, RANDOM: Vancomycin Rm: 11 ug/mL

## 2023-06-30 LAB — MRSA NEXT GEN BY PCR, NASAL: MRSA by PCR Next Gen: DETECTED — AB

## 2023-06-30 MED ORDER — SODIUM CHLORIDE 0.9 % IV SOLN
1500.0000 mg | Freq: Once | INTRAVENOUS | Status: AC
  Administered 2023-06-30: 1500 mg via INTRAVENOUS
  Filled 2023-06-30: qty 30

## 2023-06-30 MED ORDER — VANCOMYCIN HCL 750 MG/150ML IV SOLN
750.0000 mg | INTRAVENOUS | Status: DC
Start: 2023-07-03 — End: 2023-06-30

## 2023-06-30 MED ORDER — VANCOMYCIN HCL 750 MG/150ML IV SOLN
750.0000 mg | INTRAVENOUS | Status: DC
  Filled 2023-06-30: qty 150

## 2023-06-30 MED ORDER — VANCOMYCIN HCL 750 MG/150ML IV SOLN: 750.0000 mg | INTRAVENOUS | Status: DC

## 2023-06-30 NOTE — Progress Notes (Signed)
Admit: 06/27/2023 LOS: 2  20M ESRD s/p CVA  Subjective:  No interval events, palliative notes reviewed  Today  Remains aphasic, awake and tracking  01/16 0701 - 01/17 0700 In: 250 [IV Piggyback:250] Out: 0   Filed Weights   06/27/23 1551 06/28/23 1804 06/28/23 2158  Weight: 81.6 kg 74.8 kg 72.2 kg    Scheduled Meds:   stroke: early stages of recovery book   Does not apply Once   amiodarone  200 mg Oral BID   aspirin EC  81 mg Oral Daily   aspirin  150 mg Rectal Daily   calcium acetate  667 mg Oral 3 times per day on Sunday Tuesday Thursday Saturday   And   calcium acetate  667 mg Oral 2 times per day on Monday Wednesday Friday   clopidogrel  300 mg Oral Once   clopidogrel  75 mg Oral Daily   heparin  5,000 Units Subcutaneous Q8H   isosorbide mononitrate  30 mg Oral Daily   levothyroxine  50 mcg Oral Daily   metoprolol succinate  50 mg Oral Daily   mouth rinse  15 mL Mouth Rinse 4 times per day   pantoprazole  40 mg Oral BID   sertraline  100 mg Oral Daily   sodium chloride flush  3 mL Intravenous Once   Continuous Infusions:  cefTRIAXone (ROCEPHIN)  IV 1 g (06/29/23 2218)   doxycycline (VIBRAMYCIN) IV 100 mg (06/30/23 0006)   vancomycin     Followed by   Melene Muller ON 07/03/2023] vancomycin     PRN Meds:.acetaminophen **OR** acetaminophen (TYLENOL) oral liquid 160 mg/5 mL **OR** acetaminophen, morphine injection, mouth rinse, senna-docusate  Current Labs: reviewed    Physical Exam:  Blood pressure (!) 158/70, pulse 72, temperature 98.5 F (36.9 C), temperature source Oral, resp. rate 18, height 5\' 10"  (1.778 m), weight 72.2 kg, SpO2 94%. General: Lying in bed, eyes open and tracks Head: NCAT sclera not icteric MMM Neck: Supple. Lungs: Clear bilaterally. Normal WOB Heart: RRR, no murmur, rub, or gallop  Abdomen: soft non-tender no masses  Lower extremities:without edema  Dialysis Access: L AVF   Dialysis Orders:  [facility] MWF 4:00 400/600 EDW 75.3kg 2K/2.5Ca AVF Hep  5000 Last HD 1/10.  Mircera 200 q 2wks (last 06/16/23) Hectorol 1mg  TIW   Assessment/Plan: Acute CVA - multiple scattered infarcts on MRI. W/u in progress. Per neurology  Acute encephalopathy with aphasia- likely multifactorial, not improved  ESRD -  HD MWF. Cont on schedule using AVF  Hypertension/volume  - Permissive HTN per neuro. No gross volume overload   Anemia  - Hgb 9.8. On ESA as outpatient -- order today  Metabolic bone disease -  Calcium and P acceptable. Continue home meds when tolerating po  H/o SVT - on amiodarone  T2DM - per primary      Dialysis today Family working with palliative care for goals of care Medication Issues; Preferred narcotic agents for pain control are hydromorphone, fentanyl, and methadone. Morphine should not be used.  Baclofen should be avoided Avoid oral sodium phosphate and magnesium citrate based laxatives / bowel preps   Arita Miss, MD  [facility] 06/28/2023, 12:45 PM   Sabra Heck MD 06/30/2023, 10:47 AM  Recent Labs  Lab 06/27/23 1521 06/27/23 1540 06/28/23 1814  NA 136 136 136  K 4.0 4.1 4.0  CL 97* 96* 97*  CO2  --  25 20*  GLUCOSE 102* 101* 89  BUN 38* 39* 47*  CREATININE  5.80* 5.66* 6.30*  CALCIUM  --  8.8* 8.4*  PHOS  --   --  4.6   Recent Labs  Lab 06/27/23 1521 06/27/23 1540 06/28/23 1814  WBC  --  7.9 8.0  NEUTROABS  --  6.1  --   HGB 11.9* 10.7* 9.8*  HCT 35.0* 36.3* 32.4*  MCV  --  82.1 81.2  PLT  --  223 235

## 2023-06-30 NOTE — Consult Note (Signed)
[facility]    Date of Admission:  06/27/2023     Total days of antibiotics 4               Reason for Consult: Bacteremia   Referring Provider: [employer] Primary Care Provider: Babs Sciara, MD   ASSESSMENT:  [name] is a 68 y/o caucasian male with ESRD presenting to the hospital with slurred speech and altered mental status and found to have scattered acute infarcts and now Staphylococcus bacteremia. Sensitivities are pending. No vegetations seen on TTE and will need TEE given significant risk for endocarditis depending upon goals of care discussion. No clear signs of infection at present. Continue current dose of vancomycin and stop ceftriaxone and azithromycin. Remains at high risk for aspiration. Check blood cultures for clearance of bacteremia. Continue goals of care discussion. Remaining medical and supportive care per Nephrology and Internal Medicine.   PLAN:  Continue current dose of vancomycin. Check blood cultures for clearance of bacteremia. Monitor blood cultures for Staphylococcus aureus sensitivities.  Consider TEE for endocarditis pending goals of care Ongoing goals of care per Palliative Care. Remaining medical and supportive care per Nephrology and Internal Medicine.    Principal Problem:   Stroke [facility]) Active Problems:   HTN (hypertension)   S/P CABG (coronary artery bypass graft)   Type 2 diabetes mellitus (HCC)   Obstructive sleep apnea   Chronic combined systolic and diastolic CHF (congestive heart failure) (HCC)   ESRD (end stage renal disease) on dialysis (HCC)   Pleural effusion   Abnormal TSH   History of supraventricular tachycardia   Acute ischemic stroke (HCC)     stroke: early stages of recovery book   Does not apply Once   amiodarone  200 mg Oral BID   aspirin EC  81 mg Oral Daily   aspirin  150 mg Rectal Daily   calcium acetate  667 mg Oral 3 times per day on Sunday Tuesday Thursday  Saturday   And   calcium acetate  667 mg Oral 2 times per day on Monday Wednesday Friday   clopidogrel  300 mg Oral Once   clopidogrel  75 mg Oral Daily   heparin  5,000 Units Subcutaneous Q8H   isosorbide mononitrate  30 mg Oral Daily   levothyroxine  50 mcg Oral Daily   metoprolol succinate  50 mg Oral Daily   mouth rinse  15 mL Mouth Rinse 4 times per day   pantoprazole  40 mg Oral BID   sertraline  100 mg Oral Daily   sodium chloride flush  3 mL Intravenous Once     HPI: [name] is a 68 y.o. male with previous medical history of ESRD on hemodialysis (M,W,F), sleep apnea, coronary artery disease, esophageal cancer, and Type 2 diabetes presenting to the hospital with stroke like symptoms of slurred speech and altered mental status.   [name] was brought to the ED via EMS from [facility] with slurred speech and altered mental status. CT head with no hemorrhage or evidence of acute cortical infarct. Chest x-ray with small right pleural effusion with right basilar atelectasis and streaky left basilar opacities. MRI brain with multiple scattered foci of acute infarct in the bilateral cerebral hemispheres without hemorrhage. Was started on azithromycin and ceftriaxone with concern for possible aspiration pneumonia. Afebrile with no leukocytosis. Palliative care in discussion with HCPOA for goals of care with consideration for comfort care/hospice. Blood cultures are now positive  for Staphylococcus aureus with sensitivity pending.   [name] is opens his eyes to voice but is aphasic and does not answer questions nor follow any commands. Information obtained from chart review.    Review of Systems: Review of Systems  Unable to perform ROS: Mental status change     Past Medical History:  Diagnosis Date   Acute blood loss anemia 03/20/2021   Acute metabolic encephalopathy 03/03/2022   Acute on chronic respiratory failure with hypoxia (HCC) 02/28/2022   Anemia     Arthritis    hips shoulders   CAD (coronary artery disease) 03/03/2019   Cancer (HCC)    Esophageal cancer 2022   Complete rotator cuff tear 08/26/2020   Depression 05/31/2021   Esophagitis    ESRD on hemodialysis (HCC)    Fatigue 10/14/2020   gram neg rod Bacteremia 03/01/2022   Heart failure with mildly reduced ejection fraction (HFmrEF) (HCC)    Hip fracture (HCC) 09/09/2022   Hypertension    Myocardial infarction (HCC)    Sleep apnea    uses a bipap machine   Stroke (HCC) 12/14/2020   no last weakness or paralysis   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    Social History   Tobacco Use   Smoking status: Former    Current packs/day: 0.00    Types: Cigarettes    Quit date: 07/06/1988    Years since quitting: 35.0    Passive exposure: Past   Smokeless tobacco: Never  Vaping Use   Vaping status: Never Used  Substance Use Topics   Alcohol use: Not Currently    Alcohol/week: 2.0 standard drinks of alcohol    Types: 2 Glasses of wine per week    Comment: socially   Drug use: No    Family History  Problem Relation Age of Onset   Colon cancer Mother    Heart Problems Father    Diabetes Father    Valvular heart disease Father    Sleep apnea Neg Hx    Stroke Neg Hx     Allergies  Allergen Reactions   Bee Venom Anaphylaxis   Atorvastatin Other (See Comments)    myalgia   Diltiazem Itching   Lopressor [Metoprolol]     NIGHTMARES. Pt tolerating this now 11/25/22   Oxycodone Other (See Comments)    Reaction not listed on MAR    Reglan [Metoclopramide] Other (See Comments)    Suicidal    Rosuvastatin Other (See Comments)    myalgias   Valsartan Itching   Zyrtec [Cetirizine] Other (See Comments)    intolerance    OBJECTIVE: Blood pressure (!) 158/70, pulse 72, temperature 98.5 F (36.9 C), temperature source Oral, resp. rate 18, height 5\' 10"  (1.778 m), weight 72.2 kg, SpO2 94%.  Physical  Exam Constitutional:      General: He is not in acute distress.    Appearance: He is well-developed. He is ill-appearing.     Comments: Lying in bed; not following commands; global aphasia   Cardiovascular:     Rate and Rhythm: Normal rate and regular rhythm.     Heart sounds: Murmur heard.     Comments: Left arm fistula.  Pulmonary:     Effort: Pulmonary effort is normal.     Breath sounds: Normal breath sounds.  Skin:    General: Skin is warm and dry.     Lab Results Lab Results  Component Value Date  WBC 8.0 06/28/2023   HGB 9.8 (L) 06/28/2023   HCT 32.4 (L) 06/28/2023   MCV 81.2 06/28/2023   PLT 235 06/28/2023    Lab Results  Component Value Date   CREATININE 6.30 (H) 06/28/2023   BUN 47 (H) 06/28/2023   NA 136 06/28/2023   K 4.0 06/28/2023   CL 97 (L) 06/28/2023   CO2 20 (L) 06/28/2023    Lab Results  Component Value Date   ALT 7 06/27/2023   AST 11 (L) 06/27/2023   ALKPHOS 91 06/27/2023   BILITOT 1.1 06/27/2023     Microbiology: Recent Results (from the past 240 hours)  Culture, blood (Routine X 2) w Reflex to ID Panel     Status: Abnormal (Preliminary result)   Collection Time: 06/27/23  4:48 PM   Specimen: BLOOD RIGHT ARM  Result Value Ref Range Status   Specimen Description BLOOD RIGHT ARM  Final   Special Requests   Final    BOTTLES DRAWN AEROBIC AND ANAEROBIC Blood Culture results may not be optimal due to an inadequate volume of blood received in culture bottles   Culture  Setup Time   Final    GRAM POSITIVE COCCI IN CLUSTERS IN BOTH AEROBIC AND ANAEROBIC BOTTLES CRITICAL RESULT CALLED TO, READ BACK BY AND VERIFIED WITH: PHARMD K. AMEND [id number] @2101  FH    Culture (A)  Final    STAPHYLOCOCCUS HOMINIS STAPHYLOCOCCUS AUREUS SUSCEPTIBILITIES TO FOLLOW THE SIGNIFICANCE OF ISOLATING THIS ORGANISM FROM A SINGLE SET OF BLOOD CULTURES WHEN MULTIPLE SETS ARE DRAWN IS UNCERTAIN. PLEASE NOTIFY THE MICROBIOLOGY DEPARTMENT WITHIN ONE WEEK IF SPECIATION AND  SENSITIVITIES ARE REQUIRED. CRITICAL RESULT CALLED TO, READ BACK BY AND VERIFIED WITH: PHARMD B AGEE [id number] AT 1010 BY CM Performed at [facility], [address] [place], [place] [address]    Report Status PENDING  Incomplete  Blood Culture ID Panel (Reflexed)     Status: Abnormal   Collection Time: 06/27/23  4:48 PM  Result Value Ref Range Status   Enterococcus faecalis NOT DETECTED NOT DETECTED Final   Enterococcus Faecium NOT DETECTED NOT DETECTED Final   Listeria monocytogenes NOT DETECTED NOT DETECTED Final   Staphylococcus species DETECTED (A) NOT DETECTED Final    Comment: CRITICAL RESULT CALLED TO, READ BACK BY AND VERIFIED WITH: PHARMD K. AMEND [id number] @2101  FH    Staphylococcus aureus (BCID) NOT DETECTED NOT DETECTED Final   Staphylococcus epidermidis NOT DETECTED NOT DETECTED Final   Staphylococcus lugdunensis NOT DETECTED NOT DETECTED Final   Streptococcus species NOT DETECTED NOT DETECTED Final   Streptococcus agalactiae NOT DETECTED NOT DETECTED Final   Streptococcus pneumoniae NOT DETECTED NOT DETECTED Final   Streptococcus pyogenes NOT DETECTED NOT DETECTED Final   A.calcoaceticus-baumannii NOT DETECTED NOT DETECTED Final   Bacteroides fragilis NOT DETECTED NOT DETECTED Final   Enterobacterales NOT DETECTED NOT DETECTED Final   Enterobacter cloacae complex NOT DETECTED NOT DETECTED Final   Escherichia coli NOT DETECTED NOT DETECTED Final   Klebsiella aerogenes NOT DETECTED NOT DETECTED Final   Klebsiella oxytoca NOT DETECTED NOT DETECTED Final   Klebsiella pneumoniae NOT DETECTED NOT DETECTED Final   Proteus species NOT DETECTED NOT DETECTED Final   Salmonella species NOT DETECTED NOT DETECTED Final   Serratia marcescens NOT DETECTED NOT DETECTED Final   Haemophilus influenzae NOT DETECTED NOT DETECTED Final   Neisseria meningitidis NOT DETECTED NOT DETECTED Final   Pseudomonas aeruginosa NOT DETECTED NOT DETECTED Final   Stenotrophomonas maltophilia NOT  DETECTED  NOT DETECTED Final   Candida albicans NOT DETECTED NOT DETECTED Final   Candida auris NOT DETECTED NOT DETECTED Final   Candida glabrata NOT DETECTED NOT DETECTED Final   Candida krusei NOT DETECTED NOT DETECTED Final   Candida parapsilosis NOT DETECTED NOT DETECTED Final   Candida tropicalis NOT DETECTED NOT DETECTED Final   Cryptococcus neoformans/gattii NOT DETECTED NOT DETECTED Final    Comment: Performed at [facility], [address], [place], [place] [address]  Culture, blood (Routine X 2) w Reflex to ID Panel     Status: None (Preliminary result)   Collection Time: 06/27/23  6:57 PM   Specimen: BLOOD RIGHT HAND  Result Value Ref Range Status   Specimen Description BLOOD RIGHT HAND  Final   Special Requests   Final    BOTTLES DRAWN AEROBIC ONLY Blood Culture results may not be optimal due to an inadequate volume of blood received in culture bottles   Culture   Final    NO GROWTH 3 DAYS Performed at [facility], [address] [place], [place] [address]    Report Status PENDING  Incomplete  Resp panel by RT-PCR (RSV, Flu A&B, Covid) Anterior Nasal Swab     Status: None   Collection Time: 06/27/23  8:00 PM   Specimen: Anterior Nasal Swab  Result Value Ref Range Status   SARS Coronavirus 2 by RT PCR NEGATIVE NEGATIVE Final   Influenza A by PCR NEGATIVE NEGATIVE Final   Influenza B by PCR NEGATIVE NEGATIVE Final    Comment: (NOTE) The Xpert Xpress SARS-CoV-2/FLU/RSV plus assay is intended as an aid in the diagnosis of influenza from Nasopharyngeal swab specimens and should not be used as a sole basis for treatment. Nasal washings and aspirates are unacceptable for Xpert Xpress SARS-CoV-2/FLU/RSV testing.  Fact Sheet for Patients: BloggerCourse.com  Fact Sheet for Healthcare Providers: SeriousBroker.it  This test is not yet approved or cleared by the [place] FDA and has been authorized for  detection and/or diagnosis of SARS-CoV-2 by FDA under an Emergency Use Authorization (EUA). This EUA will remain in effect (meaning this test can be used) for the duration of the COVID-19 declaration under Section 564(b)(1) of the Act, 21 U.S.C. section 360bbb-3(b)(1), unless the authorization is terminated or revoked.     Resp Syncytial Virus by PCR NEGATIVE NEGATIVE Final    Comment: (NOTE) Fact Sheet for Patients: BloggerCourse.com  Fact Sheet for Healthcare Providers: SeriousBroker.it  This test is not yet approved or cleared by the [place] FDA and has been authorized for detection and/or diagnosis of SARS-CoV-2 by FDA under an Emergency Use Authorization (EUA). This EUA will remain in effect (meaning this test can be used) for the duration of the COVID-19 declaration under Section 564(b)(1) of the Act, 21 U.S.C. section 360bbb-3(b)(1), unless the authorization is terminated or revoked.  Performed at [facility], [address], [place], [place] [address]   Urine Culture     Status: None   Collection Time: 06/28/23  2:14 AM   Specimen: Urine, Random  Result Value Ref Range Status   Specimen Description URINE, RANDOM  Final   Special Requests NONE Reflexed from [id number]  Final   Culture   Final    NO GROWTH Performed at [facility], [address], [place], [place] [address]    Report Status 06/29/2023 FINAL  Final  MRSA Next Gen by PCR, Nasal     Status: Abnormal   Collection  Time: 06/29/23 10:25 PM   Specimen: Nasal Mucosa; Nasal Swab  Result Value Ref Range Status   MRSA by PCR Next Gen DETECTED (A) NOT DETECTED Final    Comment: RESULT CALLED TO, READ BACK BY AND VERIFIED WITH: Q WINSTON,RN@0044  06/30/23 MK (NOTE) The GeneXpert MRSA Assay (FDA approved for NASAL specimens only), is one component of a comprehensive MRSA colonization surveillance program. It is not intended to diagnose MRSA  infection nor to guide or monitor treatment for MRSA infections. Test performance is not FDA approved in patients less than 15 years old. Performed at [facility], [address] [place], [place] [address]      Marcos Eke, NP [facility]  06/30/2023  11:20 AM

## 2023-06-30 NOTE — Progress Notes (Addendum)
Speech Language Pathology Treatment: Dysphagia  Patient Details Name: [name] MRN: [id number] DOB: 05/09/1956 Today's Date: 06/30/2023 Time: 1350-1400 SLP Time Calculation (min) (ACUTE ONLY): 10 min  Assessment / Plan / Recommendation Clinical Impression  SLP called back to room as pt was "demanding" water. Pt appears to be fairly consistent with presentation this morning. He has anterior loss and inconsistent initiation of sucking via straw. He is able to take a little more volume of water in at a time compared to this morning though and there are not overt signs of aspiration. Given that pt is not nutritionally supported, will try to see if we can complete MBS as soon as possible. Per radiology, can be performed this afternoon, but note that pt is scheduled to have HD today. RN also checking in on that. Until MBS can be completed, would still focus on a limited amount of ice or small sips of water after oral care and with staff assistance.   Addendum: notified by RN that pt is going to HD now. Will have to f/u for MBS on subsequent date as soon as it can be completed.   HPI HPI: Pt is a 68 yo male presenting with slurred speech and AMS. MRI revealed multiple scattered foci of acute infarct in the bilateral cerebral hemispheres. Pt has been seen multiple times by SLP in the past with dysphagia suspected to be primarily of esophageal origin. He has been on pureed diets up to mechanical soft diets, always with thin liquids. PMH includes: recent PNA (dx 1/7), stroke (2022), esophageal ca (2022), HH, CAD s/p CABG, combined CHF, ESRD, DMII, HTN, SVT, OSA, UGIB. Pt bed and w/c bound since hip fx last April and now resides at [facility].      SLP Plan  MBS      Recommendations for follow up therapy are one component of a multi-disciplinary discharge planning process, led by the attending physician.  Recommendations may be updated based on patient status, additional functional criteria and insurance  authorization.    Recommendations  Diet recommendations: NPO;Other(comment) (ice chips or few small sips of water with staff after oral care) Medication Administration: Via alternative means                  Oral care QID   Frequent or constant Supervision/Assistance Dysphagia, unspecified (R13.10)     MBS     Mahala Menghini., M.A. CCC-SLP Acute Rehabilitation Services Office [phone]  Secure chat preferred   06/30/2023, 2:08 PM

## 2023-06-30 NOTE — Progress Notes (Signed)
Speech Language Pathology Treatment: Dysphagia;Cognitive-Linquistic  Patient Details Name: [name] MRN: [id number] DOB: 1956-05-22 Today's Date: 06/30/2023 Time: 1324-4010 SLP Time Calculation (min) (ACUTE ONLY): 15 min  Assessment / Plan / Recommendation Clinical Impression  Pt is alert this morning but with limited verbal output, primarily at the 1 to 2 word level. He inconsistently replied to questions, not necessarily improved when given binary choices. He did respond to verbal scanning to identify pain in his leg, for which he was repositioned. He nods "yes" consistently to wanting water, although his response to oral stimulation via straw/spoon fluctuates. After siphoning water via straw he then started to initiate sucking on a straw. Anterior loss and holding were noted, but he did swallow. Multiple swallows were observed across consistencies. Given signs of dysphagia, chronic dysphagia, and concern for PNA PTA, swallow study would be ideal; however, do not think he is at a point where he is consistently engaging with POs yet to proceed with that. Would keep NPO except for ice chips after oral care when alert and accepting.    HPI HPI: Pt is a 68 yo male presenting with slurred speech and AMS. MRI revealed multiple scattered foci of acute infarct in the bilateral cerebral hemispheres. Pt has been seen multiple times by SLP in the past with dysphagia suspected to be primarily of esophageal origin. He has been on pureed diets up to mechanical soft diets, always with thin liquids. PMH includes: recent PNA (dx 1/7), stroke (2022), esophageal ca (2022), HH, CAD s/p CABG, combined CHF, ESRD, DMII, HTN, SVT, OSA, UGIB. Pt bed and w/c bound since hip fx last April and now resides at [facility].      SLP Plan  Continue with current plan of care      Recommendations for follow up therapy are one component of a multi-disciplinary discharge planning process, led by the attending physician.   Recommendations may be updated based on patient status, additional functional criteria and insurance authorization.    Recommendations  Diet recommendations: NPO;Other(comment) (ice chips after oral care) Medication Administration: Via alternative means                  Oral care QID   Frequent or constant Supervision/Assistance Dysphagia, unspecified (R13.10)     Continue with current plan of care     Mahala Menghini., M.A. CCC-SLP Acute Rehabilitation Services Office [phone]  Secure chat preferred   06/30/2023, 11:38 AM

## 2023-06-30 NOTE — Progress Notes (Signed)
PROGRESS NOTE    [name]  ZOX:[id number] DOB: [birth year] DOA: 06/27/2023 PCP: Babs Sciara, MD   Brief Narrative:  HPI: [name] is a 68 y.o. male with medical history significant of CAD s/p CABG, combined CHF, ESRD MWF, type 2 diabetes, hypertension, SVT, OSA, upper GI bleed who presented with slurred speech and AMS.   Ex-wife who is his caretaker provides history at bedside. Pt was sedated after receiving IV ativan for MRI.  Pt moved into SNF about 9 months ago after a hospitalization. He was living along prior to that. He fracture his right hip last April and had declined and is now mostly bedbound and wheelchair bound. Caretaker last saw him well about 6 days ago on 1/9. He was diagnosed with right sided pneumonia on 1/7. Last dialysis session was Thursday 1/9. Normally schedule is MWF but he reportedly refused it yesterday which is unlike him per caretaker.    On arrival to the ED, he had temperature of 97.16F, bradycardic with heart rate of 58, elevated BP of 153/64.   He presented as a code stroke and was reported to be disoriented to month, not following commands, bilateral arm weakness, bilateral leg weakness, aphasia, dysarthria on exam.    CT head negative. 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  Severe stenosis in the proximal right super clinoid ICA and moderate stenosis in the left supraclinoid ICA.  60% stenosis in the proximal right ICA severe stenosis at the origin of the right vertebral artery with additional severe stenosis in the right V2 segment.   Chest x-ray demonstrated small right pleural effusion with patchy opacity.  There is also streaky left basilar opacity.   CBC without leukocytosis, anemia stable with hemoglobin of 10.7.   BMP with creatinine of 5.66 with his previous ranging around 3-5   TSH elevated at 11.6 but was normal in September.  Assessment &  Plan:   Principal Problem:   Stroke [facility]) Active Problems:   Chronic combined systolic and diastolic CHF (congestive heart failure) (HCC)   HTN (hypertension)   Pleural effusion   Type 2 diabetes mellitus (HCC)   Obstructive sleep apnea   S/P CABG (coronary artery bypass graft)   ESRD (end stage renal disease) on dialysis (HCC)   Abnormal TSH   History of supraventricular tachycardia   Acute ischemic stroke (HCC)  Bilateral acute ischemic stroke (HCC) Pt presented with aphasia, generalized weakness from SNF -CTA head and neck demonstrated numerous areas of stenosis including 60% stenosis in the proximal right ICA. MRI brain shows Multiple scattered foci of acute infarct in the bilateral cerebral Hemispheres.  Patient started on aspirin and Plavix and neuro recommends to continue DAPT for 3 weeks followed by aspirin alone, Still n.p.o., has not passed bedside swallow so patient is unable to take any medications p.o., currently he is on aspirin suppository only.  Neurology following. 2D Echo ejection fraction 45 to 50%.  Left atrium moderately dilated severe mitral annulus calcification LDL 24 HgbA1c 8.1 VTE prophylaxis -subcutaneous heparin  Dysphagia: Patient has failed swallow evaluation multiple times.  Family is in talks with palliative care regarding GOC.  Please see below.  Possible aspiration pneumonia/right pleural effusion which is trivial/possible UTI?: Chest x-ray shows right pleural effusion and atelectasis and patchy right basilar opacity/infiltrate. No leukocytosis and he is afebrile but procalcitonin slightly elevated.  Patient appears comfortable but aphasic so unable  to provide any history.  Cannot rule out aspiration pneumonia clinically.  Will continue Rocephin and Zithromax.  Urine culture in process.   Chronic systolic congestive heart failure: Latest echo done in September 2024 shows 35 to 40% ejection fraction.  Patient appears euvolemic.   HTN  (hypertension) Blood pressure improving, continue Toprol-XL and Imdur.  He is already on Toprol-XL.  Hold hydralazine for now.  Reassess tomorrow morning.   Type 2 diabetes mellitus (HCC) - Uncontrolled.  Hemoglobin A1c of 8.1 in March and 6.4 this admission.  Continue SSI.   Obstructive sleep apnea -not on CPAP but has nighttime O2 supplement   History of supraventricular tachycardia - Continue amiodarone and beta-blocker however patient is NPO.   Abnormal TSH -TSH elevated to 11 but was just normal in September.  On Synthroid 37.5 mcg, increased to 50 mcg.   ESRD (end stage renal disease) on dialysis [facility]) -scheduled MWF. Last session on Thursday prior to admission and then on Wednesday.  Appreciate nephrology help.     S/P CABG (coronary artery bypass graft) -continue aspirin   Goal of care: Patient was already at the nursing home and mostly bedbound due to previous strokes and multiple comorbidities, now has incomprehensible speech at times and unable to move extremities at all and failing swallow evaluation.  Carries very poor prognosis.  I have consulted palliative care to have goal of care conversation with next of kin.  I also had a conversation with patient's sister Amy DiStefano who happens to be ED physician.  She wanted to see if he responds and improves after couple of sessions of dialysis and may pass swallow, if not, they will be leaning towards possible hospice/comfort care.  Appreciate palliative care leading these conversations.  DVT prophylaxis: heparin injection 5,000 Units Start: 06/27/23 2200   Code Status: Limited: Do not attempt resuscitation (DNR) -DNR-LIMITED -Do Not Intubate/DNI   Family Communication:  None present at bedside.    Status is: Inpatient Remains inpatient appropriate because: Failing swallow.     Estimated body mass index is 22.84 kg/m as calculated from the following:   Height as of this encounter: 5\' 10"  (1.778 m).   Weight as of this  encounter: 72.2 kg.  Pressure Injury 06/28/23 Heel Left;Medial Stage 1 -  Intact skin with non-blanchable redness of a localized area usually over a bony prominence. (Active)  06/28/23 1530  Location: Heel  Location Orientation: Left;Medial  Staging: Stage 1 -  Intact skin with non-blanchable redness of a localized area usually over a bony prominence.  Wound Description (Comments):   Present on Admission: Yes  Dressing Type None 06/29/23 2100     Pressure Injury 06/28/23 Ankle Right;Lateral Stage 1 -  Intact skin with non-blanchable redness of a localized area usually over a bony prominence. (Active)  06/28/23 1530  Location: Ankle  Location Orientation: Right;Lateral  Staging: Stage 1 -  Intact skin with non-blanchable redness of a localized area usually over a bony prominence.  Wound Description (Comments):   Present on Admission: Yes  Dressing Type None 06/29/23 2100     Pressure Injury 06/28/23 Coccyx Medial Stage 2 -  Partial thickness loss of dermis presenting as a shallow open injury with a red, pink wound bed without slough. (Active)  06/28/23 1530  Location: Coccyx  Location Orientation: Medial  Staging: Stage 2 -  Partial thickness loss of dermis presenting as a shallow open injury with a red, pink wound bed without slough.  Wound Description (Comments):  Present on Admission: Yes  Dressing Type Foam - Lift dressing to assess site every shift 06/29/23 2100   Nutritional Assessment: Body mass index is 22.84 kg/m.Marland Kitchen Seen by dietician.  I agree with the assessment and plan as outlined below: Nutrition Status:        . Skin Assessment: I have examined the patient's skin and I agree with the wound assessment as performed by the wound care RN as outlined below: Pressure Injury 06/28/23 Heel Left;Medial Stage 1 -  Intact skin with non-blanchable redness of a localized area usually over a bony prominence. (Active)  06/28/23 1530  Location: Heel  Location Orientation:  Left;Medial  Staging: Stage 1 -  Intact skin with non-blanchable redness of a localized area usually over a bony prominence.  Wound Description (Comments):   Present on Admission: Yes  Dressing Type None 06/29/23 2100     Pressure Injury 06/28/23 Ankle Right;Lateral Stage 1 -  Intact skin with non-blanchable redness of a localized area usually over a bony prominence. (Active)  06/28/23 1530  Location: Ankle  Location Orientation: Right;Lateral  Staging: Stage 1 -  Intact skin with non-blanchable redness of a localized area usually over a bony prominence.  Wound Description (Comments):   Present on Admission: Yes  Dressing Type None 06/29/23 2100     Pressure Injury 06/28/23 Coccyx Medial Stage 2 -  Partial thickness loss of dermis presenting as a shallow open injury with a red, pink wound bed without slough. (Active)  06/28/23 1530  Location: Coccyx  Location Orientation: Medial  Staging: Stage 2 -  Partial thickness loss of dermis presenting as a shallow open injury with a red, pink wound bed without slough.  Wound Description (Comments):   Present on Admission: Yes  Dressing Type Foam - Lift dressing to assess site every shift 06/29/23 2100    Consultants:  Neurology  Procedures:  None  Antimicrobials:  Anti-infectives (From admission, onward)    Start     Dose/Rate Route Frequency Ordered Stop   07/03/23 1200  vancomycin (VANCOREADY) IVPB 750 mg/150 mL       Placed in "Followed by" Linked Group   750 mg 150 mL/hr over 60 Minutes Intravenous Every M-W-F (Hemodialysis) 06/30/23 1016     06/30/23 1100  Vancomycin (VANCOCIN) 1,500 mg in sodium chloride 0.9 % 500 mL IVPB       Placed in "Followed by" Linked Group   1,500 mg 250 mL/hr over 120 Minutes Intravenous  Once 06/30/23 1016     06/28/23 0700  doxycycline (VIBRAMYCIN) 100 mg in dextrose 5 % 250 mL IVPB        100 mg 125 mL/hr over 120 Minutes Intravenous Every 12 hours 06/27/23 2028     06/27/23 1700  azithromycin  (ZITHROMAX) 500 mg in sodium chloride 0.9 % 250 mL IVPB  Status:  Discontinued        500 mg 250 mL/hr over 60 Minutes Intravenous Every 24 hours 06/27/23 1650 06/27/23 2028   06/27/23 1700  cefTRIAXone (ROCEPHIN) 1 g in sodium chloride 0.9 % 100 mL IVPB        1 g 200 mL/hr over 30 Minutes Intravenous Every 24 hours 06/27/23 1650           Subjective: Patient seen and examined.  He is awake and tracking as well but unable to talk/aphasic.  Not following any commands.  Objective: Vitals:   06/29/23 1955 06/29/23 2344 06/30/23 0344 06/30/23 0758  BP: (!) 162/69 (!) 161/80 Marland Kitchen)  153/62 (!) 158/70  Pulse: 73 70 72 72  Resp: 16 16 16 18   Temp: 99.1 F (37.3 C) 99 F (37.2 C) 98.7 F (37.1 C) 98.5 F (36.9 C)  TempSrc: Oral Oral Oral Oral  SpO2: 94% 95% 95% 94%  Weight:      Height:        Intake/Output Summary (Last 24 hours) at 06/30/2023 1102 Last data filed at 06/29/2023 2218 Gross per 24 hour  Intake 250 ml  Output 0 ml  Net 250 ml   Filed Weights   06/27/23 1551 06/28/23 1804 06/28/23 2158  Weight: 81.6 kg 74.8 kg 72.2 kg    Examination:  General exam: Appears calm and comfortable and much more awake today. Respiratory system: Clear to auscultation. Respiratory effort normal. Cardiovascular system: S1 & S2 heard, RRR. No JVD, murmurs, rubs, gallops or clicks. No pedal edema. Gastrointestinal system: Abdomen is nondistended, soft and nontender. No organomegaly or masses felt. Normal bowel sounds heard. Central nervous system: Awake but unable to assess orientation since he is aphasic, not following commands.   Data Reviewed: I have personally reviewed following labs and imaging studies  CBC: Recent Labs  Lab 06/27/23 1521 06/27/23 1540 06/28/23 1814  WBC  --  7.9 8.0  NEUTROABS  --  6.1  --   HGB 11.9* 10.7* 9.8*  HCT 35.0* 36.3* 32.4*  MCV  --  82.1 81.2  PLT  --  223 235   Basic Metabolic Panel: Recent Labs  Lab 06/27/23 1521 06/27/23 1540  06/28/23 1814  NA 136 136 136  K 4.0 4.1 4.0  CL 97* 96* 97*  CO2  --  25 20*  GLUCOSE 102* 101* 89  BUN 38* 39* 47*  CREATININE 5.80* 5.66* 6.30*  CALCIUM  --  8.8* 8.4*  PHOS  --   --  4.6   GFR: Estimated Creatinine Clearance: 11.8 mL/min (A) (by C-G formula based on SCr of 6.3 mg/dL (H)). Liver Function Tests: Recent Labs  Lab 06/27/23 1540 06/28/23 1814  AST 11*  --   ALT 7  --   ALKPHOS 91  --   BILITOT 1.1  --   PROT 6.3*  --   ALBUMIN 2.7* 2.6*   No results for input(s): "LIPASE", "AMYLASE" in the last 168 hours. No results for input(s): "AMMONIA" in the last 168 hours. Coagulation Profile: Recent Labs  Lab 06/27/23 1540  INR 1.1   Cardiac Enzymes: No results for input(s): "CKTOTAL", "CKMB", "CKMBINDEX", "TROPONINI" in the last 168 hours. BNP (last 3 results) No results for input(s): "PROBNP" in the last 8760 hours. HbA1C: Recent Labs    06/28/23 1144  HGBA1C 6.4*   CBG: Recent Labs  Lab 06/27/23 1502 06/27/23 1706 06/28/23 2323  GLUCAP 100* 112* 93   Lipid Profile: Recent Labs    06/28/23 1144  CHOL 155  HDL 57  LDLCALC 75  TRIG 114  CHOLHDL 2.7   Thyroid Function Tests: Recent Labs    06/27/23 1648  TSH 11.664*   Anemia Panel: No results for input(s): "VITAMINB12", "FOLATE", "FERRITIN", "TIBC", "IRON", "RETICCTPCT" in the last 72 hours. Sepsis Labs: Recent Labs  Lab 06/27/23 1548 06/27/23 1902 06/27/23 1919  PROCALCITON  --   --  0.35  LATICACIDVEN 1.3 1.1  --     Recent Results (from the past 240 hours)  Culture, blood (Routine X 2) w Reflex to ID Panel     Status: Abnormal (Preliminary result)   Collection Time: 06/27/23  4:48  PM   Specimen: BLOOD RIGHT ARM  Result Value Ref Range Status   Specimen Description BLOOD RIGHT ARM  Final   Special Requests   Final    BOTTLES DRAWN AEROBIC AND ANAEROBIC Blood Culture results may not be optimal due to an inadequate volume of blood received in culture bottles   Culture  Setup  Time   Final    GRAM POSITIVE COCCI IN CLUSTERS IN BOTH AEROBIC AND ANAEROBIC BOTTLES CRITICAL RESULT CALLED TO, READ BACK BY AND VERIFIED WITH: PHARMD K. AMEND [id number] @2101  FH    Culture (A)  Final    STAPHYLOCOCCUS HOMINIS STAPHYLOCOCCUS AUREUS SUSCEPTIBILITIES TO FOLLOW THE SIGNIFICANCE OF ISOLATING THIS ORGANISM FROM A SINGLE SET OF BLOOD CULTURES WHEN MULTIPLE SETS ARE DRAWN IS UNCERTAIN. PLEASE NOTIFY THE MICROBIOLOGY DEPARTMENT WITHIN ONE WEEK IF SPECIATION AND SENSITIVITIES ARE REQUIRED. CRITICAL RESULT CALLED TO, READ BACK BY AND VERIFIED WITH: PHARMD B AGEE [id number] AT 1010 BY CM Performed at [facility], [address] [place], [place] [address]    Report Status PENDING  Incomplete  Blood Culture ID Panel (Reflexed)     Status: Abnormal   Collection Time: 06/27/23  4:48 PM  Result Value Ref Range Status   Enterococcus faecalis NOT DETECTED NOT DETECTED Final   Enterococcus Faecium NOT DETECTED NOT DETECTED Final   Listeria monocytogenes NOT DETECTED NOT DETECTED Final   Staphylococcus species DETECTED (A) NOT DETECTED Final    Comment: CRITICAL RESULT CALLED TO, READ BACK BY AND VERIFIED WITH: PHARMD K. AMEND [id number] @2101  FH    Staphylococcus aureus (BCID) NOT DETECTED NOT DETECTED Final   Staphylococcus epidermidis NOT DETECTED NOT DETECTED Final   Staphylococcus lugdunensis NOT DETECTED NOT DETECTED Final   Streptococcus species NOT DETECTED NOT DETECTED Final   Streptococcus agalactiae NOT DETECTED NOT DETECTED Final   Streptococcus pneumoniae NOT DETECTED NOT DETECTED Final   Streptococcus pyogenes NOT DETECTED NOT DETECTED Final   A.calcoaceticus-baumannii NOT DETECTED NOT DETECTED Final   Bacteroides fragilis NOT DETECTED NOT DETECTED Final   Enterobacterales NOT DETECTED NOT DETECTED Final   Enterobacter cloacae complex NOT DETECTED NOT DETECTED Final   Escherichia coli NOT DETECTED NOT DETECTED Final   Klebsiella aerogenes NOT DETECTED NOT DETECTED Final    Klebsiella oxytoca NOT DETECTED NOT DETECTED Final   Klebsiella pneumoniae NOT DETECTED NOT DETECTED Final   Proteus species NOT DETECTED NOT DETECTED Final   Salmonella species NOT DETECTED NOT DETECTED Final   Serratia marcescens NOT DETECTED NOT DETECTED Final   Haemophilus influenzae NOT DETECTED NOT DETECTED Final   Neisseria meningitidis NOT DETECTED NOT DETECTED Final   Pseudomonas aeruginosa NOT DETECTED NOT DETECTED Final   Stenotrophomonas maltophilia NOT DETECTED NOT DETECTED Final   Candida albicans NOT DETECTED NOT DETECTED Final   Candida auris NOT DETECTED NOT DETECTED Final   Candida glabrata NOT DETECTED NOT DETECTED Final   Candida krusei NOT DETECTED NOT DETECTED Final   Candida parapsilosis NOT DETECTED NOT DETECTED Final   Candida tropicalis NOT DETECTED NOT DETECTED Final   Cryptococcus neoformans/gattii NOT DETECTED NOT DETECTED Final    Comment: Performed at [facility], [address] [place], [place] [address]  Culture, blood (Routine X 2) w Reflex to ID Panel     Status: None (Preliminary result)   Collection Time: 06/27/23  6:57 PM   Specimen: BLOOD RIGHT HAND  Result Value Ref Range Status   Specimen Description BLOOD RIGHT HAND  Final   Special Requests  Final    BOTTLES DRAWN AEROBIC ONLY Blood Culture results may not be optimal due to an inadequate volume of blood received in culture bottles   Culture   Final    NO GROWTH 3 DAYS Performed at [facility], [address], [place], [place] [address]    Report Status PENDING  Incomplete  Resp panel by RT-PCR (RSV, Flu A&B, Covid) Anterior Nasal Swab     Status: None   Collection Time: 06/27/23  8:00 PM   Specimen: Anterior Nasal Swab  Result Value Ref Range Status   SARS Coronavirus 2 by RT PCR NEGATIVE NEGATIVE Final   Influenza A by PCR NEGATIVE NEGATIVE Final   Influenza B by PCR NEGATIVE NEGATIVE Final    Comment: (NOTE) The Xpert Xpress SARS-CoV-2/FLU/RSV plus assay is intended  as an aid in the diagnosis of influenza from Nasopharyngeal swab specimens and should not be used as a sole basis for treatment. Nasal washings and aspirates are unacceptable for Xpert Xpress SARS-CoV-2/FLU/RSV testing.  Fact Sheet for Patients: BloggerCourse.com  Fact Sheet for Healthcare Providers: SeriousBroker.it  This test is not yet approved or cleared by the [place] FDA and has been authorized for detection and/or diagnosis of SARS-CoV-2 by FDA under an Emergency Use Authorization (EUA). This EUA will remain in effect (meaning this test can be used) for the duration of the COVID-19 declaration under Section 564(b)(1) of the Act, 21 U.S.C. section 360bbb-3(b)(1), unless the authorization is terminated or revoked.     Resp Syncytial Virus by PCR NEGATIVE NEGATIVE Final    Comment: (NOTE) Fact Sheet for Patients: BloggerCourse.com  Fact Sheet for Healthcare Providers: SeriousBroker.it  This test is not yet approved or cleared by the [place] FDA and has been authorized for detection and/or diagnosis of SARS-CoV-2 by FDA under an Emergency Use Authorization (EUA). This EUA will remain in effect (meaning this test can be used) for the duration of the COVID-19 declaration under Section 564(b)(1) of the Act, 21 U.S.C. section 360bbb-3(b)(1), unless the authorization is terminated or revoked.  Performed at [facility], [address], [place], [place] [address]   Urine Culture     Status: None   Collection Time: 06/28/23  2:14 AM   Specimen: Urine, Random  Result Value Ref Range Status   Specimen Description URINE, RANDOM  Final   Special Requests NONE Reflexed from [id number]  Final   Culture   Final    NO GROWTH Performed at [facility], [address], [place], [place] [address]    Report Status 06/29/2023 FINAL  Final  MRSA Next Gen by PCR, Nasal      Status: Abnormal   Collection Time: 06/29/23 10:25 PM   Specimen: Nasal Mucosa; Nasal Swab  Result Value Ref Range Status   MRSA by PCR Next Gen DETECTED (A) NOT DETECTED Final    Comment: RESULT CALLED TO, READ BACK BY AND VERIFIED WITH: Q WINSTON,RN@0044  06/30/23 MK (NOTE) The GeneXpert MRSA Assay (FDA approved for NASAL specimens only), is one component of a comprehensive MRSA colonization surveillance program. It is not intended to diagnose MRSA infection nor to guide or monitor treatment for MRSA infections. Test performance is not FDA approved in patients less than 45 years old. Performed at [facility], [address], [place], [place] [address]      Radiology Studies: No results found.   Scheduled Meds:   stroke: early stages of recovery book   Does not  apply Once   amiodarone  200 mg Oral BID   aspirin EC  81 mg Oral Daily   aspirin  150 mg Rectal Daily   calcium acetate  667 mg Oral 3 times per day on Sunday Tuesday Thursday Saturday   And   calcium acetate  667 mg Oral 2 times per day on Monday Wednesday Friday   clopidogrel  300 mg Oral Once   clopidogrel  75 mg Oral Daily   heparin  5,000 Units Subcutaneous Q8H   isosorbide mononitrate  30 mg Oral Daily   levothyroxine  50 mcg Oral Daily   metoprolol succinate  50 mg Oral Daily   mouth rinse  15 mL Mouth Rinse 4 times per day   pantoprazole  40 mg Oral BID   sertraline  100 mg Oral Daily   sodium chloride flush  3 mL Intravenous Once   Continuous Infusions:  cefTRIAXone (ROCEPHIN)  IV 1 g (06/29/23 2218)   doxycycline (VIBRAMYCIN) IV 100 mg (06/30/23 0006)   vancomycin     Followed by   Melene Muller ON 07/03/2023] vancomycin       LOS: 2 days   Hughie Closs, MD [facility]  06/30/2023, 11:02 AM   *Please note that this is a verbal dictation therefore any spelling or grammatical errors are due to the "Dragon Medical One" system interpretation.  Please page via Amion and do not message  via secure chat for urgent patient care matters. Secure chat can be used for non urgent patient care matters.  How to contact the [facility] Attending or Consulting provider 7A - 7P or covering provider during after hours 7P -7A, for this patient?  Check the care team in [facility] and look for a) attending/consulting TR[facility] provider listed and b) the [facility] team listed. Page or secure chat 7A-7P. Log into [email or web address] and use [facility]'s universal password to access. If you do not have the password, please contact the hospital operator. Locate the [facility] provider you are looking for under [facility] and page to a number that you can be directly reached. If you still have difficulty reaching the provider, please page the [facility] (Director on Call) for the Hospitalists listed on amion for assistance.

## 2023-06-30 NOTE — Progress Notes (Signed)
PHARMACY - PHYSICIAN COMMUNICATION CRITICAL VALUE ALERT - BLOOD CULTURE IDENTIFICATION (BCID)  [name] is an 68 y.o. male who presented to [facility] on 06/27/2023 with a chief complaint of AMS  Assessment:  S. Hominis via BCID, but per phone call with microbiology, a separate bottle has now grown Staphylococcus aureus. Anticipate susceptibilities tomorrow.  Name of physician (or Provider) Contacted: Dr. Jacqulyn Bath, Dr. Luciana Axe (for ID consult)  Current antibiotics: Ceftriaxone, doxycycline for pneumonia coverage  Changes to prescribed antibiotics recommended:  Added vancomycin per protocol, ID to formally consult.  Results for orders placed or performed during the hospital encounter of 06/27/23  Blood Culture ID Panel (Reflexed) (Collected: 06/27/2023  4:48 PM)  Result Value Ref Range   Enterococcus faecalis NOT DETECTED NOT DETECTED   Enterococcus Faecium NOT DETECTED NOT DETECTED   Listeria monocytogenes NOT DETECTED NOT DETECTED   Staphylococcus species DETECTED (A) NOT DETECTED   Staphylococcus aureus (BCID) NOT DETECTED NOT DETECTED   Staphylococcus epidermidis NOT DETECTED NOT DETECTED   Staphylococcus lugdunensis NOT DETECTED NOT DETECTED   Streptococcus species NOT DETECTED NOT DETECTED   Streptococcus agalactiae NOT DETECTED NOT DETECTED   Streptococcus pneumoniae NOT DETECTED NOT DETECTED   Streptococcus pyogenes NOT DETECTED NOT DETECTED   A.calcoaceticus-baumannii NOT DETECTED NOT DETECTED   Bacteroides fragilis NOT DETECTED NOT DETECTED   Enterobacterales NOT DETECTED NOT DETECTED   Enterobacter cloacae complex NOT DETECTED NOT DETECTED   Escherichia coli NOT DETECTED NOT DETECTED   Klebsiella aerogenes NOT DETECTED NOT DETECTED   Klebsiella oxytoca NOT DETECTED NOT DETECTED   Klebsiella pneumoniae NOT DETECTED NOT DETECTED   Proteus species NOT DETECTED NOT DETECTED   Salmonella species NOT DETECTED NOT DETECTED   Serratia marcescens NOT DETECTED NOT DETECTED    Haemophilus influenzae NOT DETECTED NOT DETECTED   Neisseria meningitidis NOT DETECTED NOT DETECTED   Pseudomonas aeruginosa NOT DETECTED NOT DETECTED   Stenotrophomonas maltophilia NOT DETECTED NOT DETECTED   Candida albicans NOT DETECTED NOT DETECTED   Candida auris NOT DETECTED NOT DETECTED   Candida glabrata NOT DETECTED NOT DETECTED   Candida krusei NOT DETECTED NOT DETECTED   Candida parapsilosis NOT DETECTED NOT DETECTED   Candida tropicalis NOT DETECTED NOT DETECTED   Cryptococcus neoformans/gattii NOT DETECTED NOT DETECTED    [name] 06/30/2023  10:19 AM

## 2023-06-30 NOTE — Plan of Care (Addendum)
Conts on NPO HD today. Conts on IV abx  Will need barium swallow test, possibly tomorrow.  1800 Provider paged about IVF order since patient is still NPO. Awaiting reply.    Problem: Ischemic Stroke/TIA Tissue Perfusion: Goal: Complications of ischemic stroke/TIA will be minimized Outcome: Not Met (add Reason)   Problem: Self-Care: Goal: Ability to participate in self-care as condition permits will improve Outcome: Not Met (add Reason) Goal: Verbalization of feelings and concerns over difficulty with self-care will improve Outcome: Not Met (add Reason) Goal: Ability to communicate needs accurately will improve Outcome: Not Met (add Reason)   Problem: Coping: Goal: Ability to adjust to condition or change in health will improve Outcome: Not Met (add Reason)   Problem: Fluid Volume: Goal: Ability to maintain a balanced intake and output will improve Outcome: Not Met (add Reason)   Problem: Skin Integrity: Goal: Risk for impaired skin integrity will decrease Outcome: Not Met (add Reason)   Problem: Nutritional: Goal: Maintenance of adequate nutrition will improve Outcome: Not Met (add Reason) Goal: Progress toward achieving an optimal weight will improve Outcome: Not Met (add Reason)   Problem: Health Behavior/Discharge Planning: Goal: Ability to manage health-related needs will improve Outcome: Not Met (add Reason)   Problem: Tissue Perfusion: Goal: Adequacy of tissue perfusion will improve Outcome: Not Met (add Reason)   Problem: Pain Managment: Goal: General experience of comfort will improve and/or be controlled Outcome: Not Met (add Reason)   Problem: Skin Integrity: Goal: Risk for impaired skin integrity will decrease Outcome: Not Met (add Reason)

## 2023-06-30 NOTE — Progress Notes (Signed)
Palliative:  HPI:  68 y.o. male  with past medical history of stroke, CAD s/p CABG, HFmrEF, ESRD on HD MWF, diabetes, HTN, SVT, OSA, upper GIB, esophageal cancer 2022, right eye vision loss, s/p hip fracture s/p repair admitted on 06/27/2023 with slurred speech and AMS due to stroke, uremia (missed HD), and new stroke.    I met today with [name]. He makes eye contact with me and verbally responds to some of my questions. Answers "no" when I ask about pain. Today he is able to track and turn head towards voice. He follows some commands intermittently. Overall appears extremely weak.   I called and reviewed with sister/HCPOA, Amy Wale. I reviewed my assessment of patient today - maybe slightly improved from yesterday. We review that unfortunately patient has not yet received HD and at this point will likely not be done with his HD sessions before I am gone for the day. Amy understands this. She would like to see how patient is doing following HD session planned for this evening. We also review that there are plans for MBS and this information could also help with decision making. Amy plans to come and see [name] possibly tomorrow to help aid her decision making.   All questions/concerns addressed.  Exam: Alert, makes eye contact, simple verbal responses. No distress. No distress. Breathing regular, unlabored. Abd soft. Generalized weakness.   Plan: - DNR/DNI - No plans for feeding tube - Reassess goals after repeat HD treatment this evening/MBS eval - HCPOA planning to visit over weekend; ongoing GOC discussions  30 min  Jaaron Oleson Andi Devon, DNP, [facility] Palliative Medicine Team Team Phone # [phone]  Pager # [phone]

## 2023-06-30 NOTE — Plan of Care (Signed)
  Problem: Education: Goal: Knowledge of disease or condition will improve Outcome: Progressing Goal: Knowledge of secondary prevention will improve (MUST DOCUMENT ALL) Outcome: Progressing Goal: Knowledge of patient specific risk factors will improve Loraine Leriche N/A or DELETE if not current risk factor) Outcome: Progressing   Problem: Ischemic Stroke/TIA Tissue Perfusion: Goal: Complications of ischemic stroke/TIA will be minimized Outcome: Progressing   Problem: Coping: Goal: Will verbalize positive feelings about self Outcome: Progressing Goal: Will identify appropriate support needs Outcome: Progressing   Problem: Health Behavior/Discharge Planning: Goal: Ability to manage health-related needs will improve Outcome: Progressing Goal: Goals will be collaboratively established with patient/family Outcome: Progressing   Problem: Self-Care: Goal: Ability to participate in self-care as condition permits will improve Outcome: Progressing Goal: Verbalization of feelings and concerns over difficulty with self-care will improve Outcome: Progressing Goal: Ability to communicate needs accurately will improve Outcome: Progressing   Problem: Nutrition: Goal: Risk of aspiration will decrease Outcome: Progressing Goal: Dietary intake will improve Outcome: Progressing   Problem: Education: Goal: Ability to describe self-care measures that may prevent or decrease complications (Diabetes Survival Skills Education) will improve Outcome: Progressing Goal: Individualized Educational Video(s) Outcome: Progressing   Problem: Coping: Goal: Ability to adjust to condition or change in health will improve Outcome: Progressing   Problem: Fluid Volume: Goal: Ability to maintain a balanced intake and output will improve Outcome: Progressing   Problem: Health Behavior/Discharge Planning: Goal: Ability to identify and utilize available resources and services will improve Outcome:  Progressing Goal: Ability to manage health-related needs will improve Outcome: Progressing   Problem: Metabolic: Goal: Ability to maintain appropriate glucose levels will improve Outcome: Progressing   Problem: Nutritional: Goal: Maintenance of adequate nutrition will improve Outcome: Progressing Goal: Progress toward achieving an optimal weight will improve Outcome: Progressing   Problem: Skin Integrity: Goal: Risk for impaired skin integrity will decrease Outcome: Progressing   Problem: Tissue Perfusion: Goal: Adequacy of tissue perfusion will improve Outcome: Progressing   Problem: Education: Goal: Knowledge of General Education information will improve Description: Including pain rating scale, medication(s)/side effects and non-pharmacologic comfort measures Outcome: Progressing   Problem: Health Behavior/Discharge Planning: Goal: Ability to manage health-related needs will improve Outcome: Progressing   Problem: Clinical Measurements: Goal: Ability to maintain clinical measurements within normal limits will improve Outcome: Progressing Goal: Will remain free from infection Outcome: Progressing Goal: Diagnostic test results will improve Outcome: Progressing Goal: Respiratory complications will improve Outcome: Progressing Goal: Cardiovascular complication will be avoided Outcome: Progressing   Problem: Activity: Goal: Risk for activity intolerance will decrease Outcome: Progressing   Problem: Nutrition: Goal: Adequate nutrition will be maintained Outcome: Progressing   Problem: Coping: Goal: Level of anxiety will decrease Outcome: Progressing   Problem: Elimination: Goal: Will not experience complications related to bowel motility Outcome: Progressing Goal: Will not experience complications related to urinary retention Outcome: Progressing   Problem: Pain Managment: Goal: General experience of comfort will improve and/or be controlled Outcome:  Progressing   Problem: Safety: Goal: Ability to remain free from injury will improve Outcome: Progressing   Problem: Skin Integrity: Goal: Risk for impaired skin integrity will decrease Outcome: Progressing

## 2023-06-30 NOTE — Progress Notes (Signed)
   06/30/23 1835  Vitals  Temp 97.9 F (36.6 C)  Pulse Rate 72  Resp 18  BP (!) 147/69  SpO2 92 %  O2 Device Room Air  Oxygen Therapy  Patient Activity (if Appropriate) In bed  Pulse Oximetry Type Continuous  Oximetry Probe Site Changed No  Post Treatment  Dialyzer Clearance Lightly streaked  Hemodialysis Intake (mL) 0 mL  Liters Processed 72  Fluid Removed (mL) 2000 mL  Tolerated HD Treatment Yes  AVG/AVF Arterial Site Held (minutes) 5 minutes  AVG/AVF Venous Site Held (minutes) 5 minutes   Received patient in bed to unit.  Alert. Informed consent signed and in chart.   TX duration: 3 hours  Patient tolerated well.  Transported back to the room  Alert, without acute distress.  Hand-off given to patient's nurse.   Access used: Left AV fistula Access issues: None  Total UF removed: 2L Medication(s) given: None

## 2023-06-30 NOTE — Progress Notes (Signed)
Pharmacy Antibiotic Note  [name] is a 68 y.o. male admitted on 06/27/2023 with  altered mental status with CVA, now found to have Staphylococcus aureus bacteremia .  Pharmacy has been consulted for vancomycin dosing.  [employer] initially detected S. Hominis species via aerobic bottle, but now anaerobic bottle (which appropriately did not have BCID performed due to similar morphology), now growing Staphyloccocus aureus. Anticipate susceptibilities tomorrow. 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 Patient also remains on empiric CAP coverage with ceftriaxone and doxycycline per primary MD. Follow for formal ID consult note soon. Patient to resume home MWF dialysis per Nephrology.   Plan: Vancomycin 1500 mg IV x1, then 750 mg IV every HD MWF Monitor culture clearance and ID recommendations Monitor level at steady state, with random level goal of 15-25  Height: 5\' 10"  (177.8 cm) Weight: 72.2 kg (159 lb 2.8 oz) (bed) IBW/kg (Calculated) : 73  Temp (24hrs), Avg:98.7 F (37.1 C), Min:98.4 F (36.9 C), Max:99.1 F (37.3 C)  Recent Labs  Lab 06/27/23 1521 06/27/23 1540 06/27/23 1548 06/27/23 1902 06/28/23 1814  WBC  --  7.9  --   --  8.0  CREATININE 5.80* 5.66*  --   --  6.30*  LATICACIDVEN  --   --  1.3 1.1  --     Estimated Creatinine Clearance: 11.8 mL/min (A) (by C-G formula based on SCr of 6.3 mg/dL (H)).    Allergies  Allergen Reactions   Bee Venom Anaphylaxis   Atorvastatin Other (See Comments)    myalgia   Diltiazem Itching   Lopressor [Metoprolol]     NIGHTMARES. Pt tolerating this now 11/25/22   Oxycodone Other (See Comments)    Reaction not listed on MAR    Reglan [Metoclopramide] Other (See Comments)    Suicidal    Rosuvastatin Other (See Comments)    myalgias   Valsartan Itching   Zyrtec [Cetirizine] Other (See Comments)    intolerance    Antimicrobials this admission: Ceftriaxone 1/14 >> 1/17 Doxycycline 1/14 >> 1/17 Vancomycin 1/17  >>   Dose adjustments this admission: None  Microbiology results: 1/14 BCx: 1/4 S. Hominis, 1/4 S. aureus 1/16 MRSA PCR: detected  Thank you for allowing pharmacy to be a part of this patient's care.  Louie Casa [name] 06/30/2023 11:06 AM

## 2023-07-01 DIAGNOSIS — Z515 Encounter for palliative care: Secondary | ICD-10-CM | POA: Diagnosis not present

## 2023-07-01 DIAGNOSIS — Z7189 Other specified counseling: Secondary | ICD-10-CM | POA: Diagnosis not present

## 2023-07-01 DIAGNOSIS — I639 Cerebral infarction, unspecified: Secondary | ICD-10-CM | POA: Diagnosis not present

## 2023-07-01 LAB — CULTURE, BLOOD (ROUTINE X 2)

## 2023-07-01 LAB — GLUCOSE, CAPILLARY
Glucose-Capillary: 73 mg/dL (ref 70–99)
Glucose-Capillary: 71 mg/dL (ref 70–99)
Glucose-Capillary: 133 mg/dL — ABNORMAL HIGH (ref 70–99)
Glucose-Capillary: 103 mg/dL — ABNORMAL HIGH (ref 70–99)

## 2023-07-01 MED ORDER — DEXTROSE 50 % IV SOLN
12.5000 g | INTRAVENOUS | Status: AC
  Administered 2023-07-01: 12.5 g via INTRAVENOUS
  Filled 2023-07-01: qty 50

## 2023-07-01 MED ORDER — ENSURE ENLIVE PO LIQD
237.0000 mL | Freq: Two times a day (BID) | ORAL | Status: DC
Start: 2023-07-01 — End: 2023-07-14
  Administered 2023-07-01 – 2023-07-13 (×5): 237 mL via ORAL

## 2023-07-01 MED ORDER — VANCOMYCIN HCL 500 MG/100ML IV SOLN
500.0000 mg | Freq: Once | INTRAVENOUS | Status: AC
  Administered 2023-07-01: 500 mg via INTRAVENOUS
  Filled 2023-07-01: qty 100

## 2023-07-01 NOTE — Progress Notes (Signed)
Admit: 06/27/2023 LOS: 3  38M ESRD s/p CVA  Subjective:  Seen in the room HD yesterday with 2 L UF On my exam no clear change in mental status, awakens, tracks but does not answer any questions Palliative following  01/17 0701 - 01/18 0700 In: 0  Out: 2000   Filed Weights   06/28/23 1804 06/28/23 2158 06/30/23 1450  Weight: 74.8 kg 72.2 kg 72.2 kg    Scheduled Meds:   stroke: early stages of recovery book   Does not apply Once   amiodarone  200 mg Oral BID   aspirin EC  81 mg Oral Daily   aspirin  150 mg Rectal Daily   calcium acetate  667 mg Oral 3 times per day on Sunday Tuesday Thursday Saturday   And   calcium acetate  667 mg Oral 2 times per day on Monday Wednesday Friday   clopidogrel  300 mg Oral Once   clopidogrel  75 mg Oral Daily   heparin  5,000 Units Subcutaneous Q8H   isosorbide mononitrate  30 mg Oral Daily   levothyroxine  50 mcg Oral Daily   metoprolol succinate  50 mg Oral Daily   mouth rinse  15 mL Mouth Rinse 4 times per day   pantoprazole  40 mg Oral BID   sertraline  100 mg Oral Daily   sodium chloride flush  3 mL Intravenous Once   Continuous Infusions:  vancomycin Stopped (06/30/23 1825)   PRN Meds:.acetaminophen **OR** acetaminophen (TYLENOL) oral liquid 160 mg/5 mL **OR** acetaminophen, morphine injection, mouth rinse, senna-docusate  Current Labs: reviewed    Physical Exam:  Blood pressure (!) 165/72, pulse 77, temperature 98.6 F (37 C), temperature source Oral, resp. rate 14, height 5\' 10"  (1.778 m), weight 72.2 kg, SpO2 95%. General: Lying in bed, eyes open and tracks Head: NCAT sclera not icteric MMM Neck: Supple. Lungs: Clear bilaterally. Normal WOB Heart: RRR, no murmur, rub, or gallop  Abdomen: soft non-tender no masses  Lower extremities:without edema  Dialysis Access: L AVF   Dialysis Orders:  [facility] MWF 4:00 400/600 EDW 75.3kg 2K/2.5Ca AVF Hep 5000 Last HD 1/10.  Mircera 200 q 2wks (last 06/16/23) Hectorol 1mg  TIW    Assessment/Plan: Acute CVA - multiple scattered infarcts on MRI. W/u in progress. Per neurology  Acute encephalopathy with aphasia- likely multifactorial, not improved  ESRD -  HD MWF. Cont on schedule using AVF pending goals of care decisions  Hypertension/volume  - Permissive HTN per neuro. No gross volume overload   Anemia  - Hgb 9.8. On ESA as outpatient -- order today  Metabolic bone disease -  Calcium and P acceptable. Continue home meds when tolerating po  H/o SVT - on amiodarone  T2DM - per primary      Dialysis next 1/20 based upon family decision Family working with palliative care for goals of care Medication Issues; Preferred narcotic agents for pain control are hydromorphone, fentanyl, and methadone. Morphine should not be used.  Baclofen should be avoided Avoid oral sodium phosphate and magnesium citrate based laxatives / bowel preps   Arita Miss, MD  [facility] 06/28/2023, 12:45 PM   Sabra Heck MD 07/01/2023, 10:03 AM  Recent Labs  Lab 06/27/23 1540 06/28/23 1814 06/30/23 1556  NA 136 136 137  K 4.1 4.0 3.5  CL 96* 97* 98  CO2 25 20* 23  GLUCOSE 101* 89 72  BUN 39* 47* 33*  CREATININE 5.66* 6.30* 5.03*  CALCIUM 8.8* 8.4* 8.2*  PHOS  --  4.6 4.3   Recent Labs  Lab 06/27/23 1540 06/28/23 1814 06/30/23 1556  WBC 7.9 8.0 7.0  NEUTROABS 6.1  --   --   HGB 10.7* 9.8* 10.2*  HCT 36.3* 32.4* 33.7*  MCV 82.1 81.2 81.4  PLT 223 235 247

## 2023-07-01 NOTE — Progress Notes (Signed)
PROGRESS NOTE    [name]  ZOX:[id number] DOB: 1956-05-31 DOA: 06/27/2023 PCP: Babs Sciara, MD   Brief Narrative:  HPI: [name] is a 68 y.o. male with medical history significant of CAD s/p CABG, combined CHF, ESRD MWF, type 2 diabetes, hypertension, SVT, OSA, upper GI bleed who presented with slurred speech and AMS.   Ex-wife who is his caretaker provides history at bedside. Pt was sedated after receiving IV ativan for MRI.  Pt moved into SNF about 9 months ago after a hospitalization. He was living along prior to that. He fracture his right hip last April and had declined and is now mostly bedbound and wheelchair bound. Caretaker last saw him well about 6 days ago on 1/9. He was diagnosed with right sided pneumonia on 1/7. Last dialysis session was Thursday 1/9. Normally schedule is MWF but he reportedly refused it yesterday which is unlike him per caretaker.    On arrival to the ED, he had temperature of 97.33F, bradycardic with heart rate of 58, elevated BP of 153/64.   He presented as a code stroke and was reported to be disoriented to month, not following commands, bilateral arm weakness, bilateral leg weakness, aphasia, dysarthria on exam.    CT head negative. 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  Severe stenosis in the proximal right super clinoid ICA and moderate stenosis in the left supraclinoid ICA.  60% stenosis in the proximal right ICA severe stenosis at the origin of the right vertebral artery with additional severe stenosis in the right V2 segment.   Chest x-ray demonstrated small right pleural effusion with patchy opacity.  There is also streaky left basilar opacity.   CBC without leukocytosis, anemia stable with hemoglobin of 10.7.   BMP with creatinine of 5.66 with his previous ranging around 3-5   TSH elevated at 11.6 but was normal in September.  Assessment &  Plan:   Principal Problem:   Stroke [facility]) Active Problems:   Chronic combined systolic and diastolic CHF (congestive heart failure) (HCC)   HTN (hypertension)   Pleural effusion   Type 2 diabetes mellitus (HCC)   Obstructive sleep apnea   S/P CABG (coronary artery bypass graft)   ESRD (end stage renal disease) on dialysis (HCC)   Abnormal TSH   History of supraventricular tachycardia   Acute ischemic stroke (HCC)   Bacteremia   Pressure injury of skin  Bilateral acute ischemic stroke (HCC) Pt presented with aphasia, generalized weakness from SNF -CTA head and neck demonstrated numerous areas of stenosis including 60% stenosis in the proximal right ICA. MRI brain shows Multiple scattered foci of acute infarct in the bilateral cerebral Hemispheres.  Patient started on aspirin and Plavix and neuro recommends to continue DAPT for 3 weeks followed by aspirin alone.  Due to patient being n.p.o. and failing swallow, he was on aspirin suppository.  However he has improved significantly today, able to pass swallow, MBS not needed per SLP and he has been started on dysphagia 2 diet.  We will introduce all the necessary medications now.  Neurology following. 2D Echo ejection fraction 45 to 50%.  Left atrium moderately dilated severe mitral annulus calcification LDL 24 HgbA1c 8.1 VTE prophylaxis -subcutaneous heparin  Dysphagia: Passed bedside swallow today, details as above.  Possible aspiration pneumonia/right pleural effusion which is trivial/possible UTI?: Chest x-ray shows right pleural effusion and atelectasis and patchy right  basilar opacity/infiltrate. No leukocytosis and he is afebrile but procalcitonin slightly elevated.  Patient appears comfortable but aphasic so unable to provide any history.  Cannot rule out aspiration pneumonia clinically.  Will continue Rocephin and Zithromax.  Urine culture negative.   Chronic systolic congestive heart failure: Latest echo done in September 2024  shows 35 to 40% ejection fraction.  Patient appears euvolemic.   HTN (hypertension) Blood pressure improving, should be able to take p.o. medications, continue Imdur, Toprol-XL.     Type 2 diabetes mellitus (HCC) - Uncontrolled.  Hemoglobin A1c of 8.1 in March and 6.4 this admission.  Continue SSI.   Obstructive sleep apnea -not on CPAP but has nighttime O2 supplement   History of supraventricular tachycardia - Continue amiodarone and beta-blocker however patient is NPO.   Abnormal TSH -TSH elevated to 11 but was just normal in September.  On Synthroid 37.5 mcg, increased to 50 mcg.   ESRD (end stage renal disease) on dialysis [facility]) -scheduled MWF. Last session on Thursday prior to admission and then on Wednesday.  Appreciate nephrology help.     S/P CABG (coronary artery bypass graft) -continue aspirin  History of SVT: On amiodarone PTA, was on hold due to being n.p.o. but no reports of SVT during this hospitalization.  Will resume amiodarone now that he is able to take p.o.   Goal of care: Patient was already at the nursing home and mostly bedbound due to previous strokes and multiple comorbidities, now has incomprehensible speech at times and unable to move extremities at all and failing swallow evaluation.  Carries very poor prognosis.  Palliative care consulted.  Family discussions took place.  Family wanted to wait and hoped that he might improve after dialysis and fortunately, his mentation improved and diet has been introduced after evaluation by SLP today.  DVT prophylaxis: heparin injection 5,000 Units Start: 06/27/23 2200   Code Status: Limited: Do not attempt resuscitation (DNR) -DNR-LIMITED -Do Not Intubate/DNI   Family Communication:  None present at bedside.    Status is: Inpatient Remains inpatient appropriate because: Passed bedside swallow today.  Will need placement to SNF if continues to improve.     Estimated body mass index is 22.84 kg/m as calculated from  the following:   Height as of this encounter: 5\' 10"  (1.778 m).   Weight as of this encounter: 72.2 kg.  Pressure Injury 06/28/23 Heel Left;Medial Stage 1 -  Intact skin with non-blanchable redness of a localized area usually over a bony prominence. (Active)  06/28/23 1530  Location: Heel  Location Orientation: Left;Medial  Staging: Stage 1 -  Intact skin with non-blanchable redness of a localized area usually over a bony prominence.  Wound Description (Comments):   Present on Admission: Yes  Dressing Type None 07/01/23 1100     Pressure Injury 06/28/23 Ankle Right;Lateral Stage 1 -  Intact skin with non-blanchable redness of a localized area usually over a bony prominence. (Active)  06/28/23 1530  Location: Ankle  Location Orientation: Right;Lateral  Staging: Stage 1 -  Intact skin with non-blanchable redness of a localized area usually over a bony prominence.  Wound Description (Comments):   Present on Admission: Yes  Dressing Type None 07/01/23 1100     Pressure Injury 06/28/23 Coccyx Medial Stage 2 -  Partial thickness loss of dermis presenting as a shallow open injury with a red, pink wound bed without slough. (Active)  06/28/23 1530  Location: Coccyx  Location Orientation: Medial  Staging: Stage 2 -  Partial thickness loss of dermis presenting as a shallow open injury with a red, pink wound bed without slough.  Wound Description (Comments):   Present on Admission: Yes  Dressing Type Foam - Lift dressing to assess site every shift 07/01/23 1100   Nutritional Assessment: Body mass index is 22.84 kg/m.Marland Kitchen Seen by dietician.  I agree with the assessment and plan as outlined below: Nutrition Status:        . Skin Assessment: I have examined the patient's skin and I agree with the wound assessment as performed by the wound care RN as outlined below: Pressure Injury 06/28/23 Heel Left;Medial Stage 1 -  Intact skin with non-blanchable redness of a localized area usually over a  bony prominence. (Active)  06/28/23 1530  Location: Heel  Location Orientation: Left;Medial  Staging: Stage 1 -  Intact skin with non-blanchable redness of a localized area usually over a bony prominence.  Wound Description (Comments):   Present on Admission: Yes  Dressing Type None 07/01/23 1100     Pressure Injury 06/28/23 Ankle Right;Lateral Stage 1 -  Intact skin with non-blanchable redness of a localized area usually over a bony prominence. (Active)  06/28/23 1530  Location: Ankle  Location Orientation: Right;Lateral  Staging: Stage 1 -  Intact skin with non-blanchable redness of a localized area usually over a bony prominence.  Wound Description (Comments):   Present on Admission: Yes  Dressing Type None 07/01/23 1100     Pressure Injury 06/28/23 Coccyx Medial Stage 2 -  Partial thickness loss of dermis presenting as a shallow open injury with a red, pink wound bed without slough. (Active)  06/28/23 1530  Location: Coccyx  Location Orientation: Medial  Staging: Stage 2 -  Partial thickness loss of dermis presenting as a shallow open injury with a red, pink wound bed without slough.  Wound Description (Comments):   Present on Admission: Yes  Dressing Type Foam - Lift dressing to assess site every shift 07/01/23 1100    Consultants:  Neurology  Procedures:  None  Antimicrobials:  Anti-infectives (From admission, onward)    Start     Dose/Rate Route Frequency Ordered Stop   07/03/23 1200  vancomycin (VANCOREADY) IVPB 750 mg/150 mL  Status:  Discontinued       Placed in "Followed by" Linked Group   750 mg 150 mL/hr over 60 Minutes Intravenous Every M-W-F (Hemodialysis) 06/30/23 1016 06/30/23 1440   07/01/23 0345  vancomycin (VANCOREADY) IVPB 500 mg/100 mL        500 mg 100 mL/hr over 60 Minutes Intravenous  Once 07/01/23 0257 07/01/23 0414   06/30/23 1600  vancomycin (VANCOREADY) IVPB 750 mg/150 mL  Status:  Discontinued       Placed in "Followed by" Linked Group    750 mg 150 mL/hr over 60 Minutes Intravenous Every M-W-F (Hemodialysis) 06/30/23 1440 06/30/23 1507   06/30/23 1600  vancomycin (VANCOREADY) IVPB 750 mg/150 mL       Placed in "Followed by" Linked Group   750 mg 150 mL/hr over 60 Minutes Intravenous Every M-W-F (Hemodialysis) 06/30/23 1507     06/30/23 1100  Vancomycin (VANCOCIN) 1,500 mg in sodium chloride 0.9 % 500 mL IVPB       Placed in "Followed by" Linked Group   1,500 mg 250 mL/hr over 120 Minutes Intravenous  Once 06/30/23 1016 06/30/23 1510   06/28/23 0700  doxycycline (VIBRAMYCIN) 100 mg in dextrose 5 % 250 mL IVPB  Status:  Discontinued  100 mg 125 mL/hr over 120 Minutes Intravenous Every 12 hours 06/27/23 2028 06/30/23 1104   06/27/23 1700  azithromycin (ZITHROMAX) 500 mg in sodium chloride 0.9 % 250 mL IVPB  Status:  Discontinued        500 mg 250 mL/hr over 60 Minutes Intravenous Every 24 hours 06/27/23 1650 06/27/23 2028   06/27/23 1700  cefTRIAXone (ROCEPHIN) 1 g in sodium chloride 0.9 % 100 mL IVPB  Status:  Discontinued        1 g 200 mL/hr over 30 Minutes Intravenous Every 24 hours 06/27/23 1650 06/30/23 1104         Subjective: Patient seen and examined.  For the first time today, he was able to engage in conversations today.  When I asked him how he was feeling, he said " I am good" and then asked " who are you".  He was trying to follow commands and trying to lift extremities but had only some power 1/5 in left upper extremity but no power in any other extremities.  Objective: Vitals:   06/30/23 2021 06/30/23 2340 07/01/23 0316 07/01/23 0734  BP: (!) 143/75 (!) 156/70 (!) 162/87 (!) 165/72  Pulse: 74 75 76 77  Resp: 18 18 16 14   Temp: 98.7 F (37.1 C) 98.8 F (37.1 C) 97.7 F (36.5 C) 98.6 F (37 C)  TempSrc: Oral Oral Oral Oral  SpO2: 95% 91% 90% 95%  Weight:      Height:        Intake/Output Summary (Last 24 hours) at 07/01/2023 1116 Last data filed at 07/01/2023 0314 Gross per 24 hour   Intake 0 ml  Output 2000 ml  Net -2000 ml   Filed Weights   06/28/23 1804 06/28/23 2158 06/30/23 1450  Weight: 74.8 kg 72.2 kg 72.2 kg    Examination:  General exam: Appears calm and comfortable, more awake today Respiratory system: Clear to auscultation. Respiratory effort normal. Cardiovascular system: S1 & S2 heard, RRR. No JVD, murmurs, rubs, gallops or clicks. No pedal edema. Gastrointestinal system: Abdomen is nondistended, soft and nontender. No organomegaly or masses felt. Normal bowel sounds heard. Central nervous system: Alert but unable to assess orientation.  1/5 power in left upper extremity but 0 Pamolyn rest of the 3 extremities.   Data Reviewed: I have personally reviewed following labs and imaging studies  CBC: Recent Labs  Lab 06/27/23 1521 06/27/23 1540 06/28/23 1814 06/30/23 1556  WBC  --  7.9 8.0 7.0  NEUTROABS  --  6.1  --   --   HGB 11.9* 10.7* 9.8* 10.2*  HCT 35.0* 36.3* 32.4* 33.7*  MCV  --  82.1 81.2 81.4  PLT  --  223 235 247   Basic Metabolic Panel: Recent Labs  Lab 06/27/23 1521 06/27/23 1540 06/28/23 1814 06/30/23 1556  NA 136 136 136 137  K 4.0 4.1 4.0 3.5  CL 97* 96* 97* 98  CO2  --  25 20* 23  GLUCOSE 102* 101* 89 72  BUN 38* 39* 47* 33*  CREATININE 5.80* 5.66* 6.30* 5.03*  CALCIUM  --  8.8* 8.4* 8.2*  PHOS  --   --  4.6 4.3   GFR: Estimated Creatinine Clearance: 14.8 mL/min (A) (by C-G formula based on SCr of 5.03 mg/dL (H)). Liver Function Tests: Recent Labs  Lab 06/27/23 1540 06/28/23 1814 06/30/23 1556  AST 11*  --   --   ALT 7  --   --   ALKPHOS 91  --   --  BILITOT 1.1  --   --   PROT 6.3*  --   --   ALBUMIN 2.7* 2.6* 2.5*   No results for input(s): "LIPASE", "AMYLASE" in the last 168 hours. No results for input(s): "AMMONIA" in the last 168 hours. Coagulation Profile: Recent Labs  Lab 06/27/23 1540  INR 1.1   Cardiac Enzymes: No results for input(s): "CKTOTAL", "CKMB", "CKMBINDEX", "TROPONINI" in the  last 168 hours. BNP (last 3 results) No results for input(s): "PROBNP" in the last 8760 hours. HbA1C: Recent Labs    06/28/23 1144  HGBA1C 6.4*   CBG: Recent Labs  Lab 06/28/23 2323 06/30/23 2022 06/30/23 2341 07/01/23 0323 07/01/23 0730  GLUCAP 93 77 76 73 71   Lipid Profile: Recent Labs    06/28/23 1144  CHOL 155  HDL 57  LDLCALC 75  TRIG 114  CHOLHDL 2.7   Thyroid Function Tests: No results for input(s): "TSH", "T4TOTAL", "FREET4", "T3FREE", "THYROIDAB" in the last 72 hours.  Anemia Panel: No results for input(s): "VITAMINB12", "FOLATE", "FERRITIN", "TIBC", "IRON", "RETICCTPCT" in the last 72 hours. Sepsis Labs: Recent Labs  Lab 06/27/23 1548 06/27/23 1902 06/27/23 1919  PROCALCITON  --   --  0.35  LATICACIDVEN 1.3 1.1  --     Recent Results (from the past 240 hours)  Culture, blood (Routine X 2) w Reflex to ID Panel     Status: Abnormal   Collection Time: 06/27/23  4:48 PM   Specimen: BLOOD RIGHT ARM  Result Value Ref Range Status   Specimen Description BLOOD RIGHT ARM  Final   Special Requests   Final    BOTTLES DRAWN AEROBIC AND ANAEROBIC Blood Culture results may not be optimal due to an inadequate volume of blood received in culture bottles   Culture  Setup Time   Final    GRAM POSITIVE COCCI IN CLUSTERS IN BOTH AEROBIC AND ANAEROBIC BOTTLES CRITICAL RESULT CALLED TO, READ BACK BY AND VERIFIED WITH: PHARMD K. AMEND [id number] @2101  FH    Culture (A)  Final    STAPHYLOCOCCUS HOMINIS METHICILLIN RESISTANT STAPHYLOCOCCUS AUREUS THE SIGNIFICANCE OF ISOLATING THIS ORGANISM FROM A SINGLE SET OF BLOOD CULTURES WHEN MULTIPLE SETS ARE DRAWN IS UNCERTAIN. PLEASE NOTIFY THE MICROBIOLOGY DEPARTMENT WITHIN ONE WEEK IF SPECIATION AND SENSITIVITIES ARE REQUIRED. FOR STAPHYLOCOCCUS HOMINIS CRITICAL RESULT CALLED TO, READ BACK BY AND VERIFIED WITH: PHARMD B AGEE [id number] AT 1010 BY CM Performed at [facility], [address] [place], [place] [address]     Report Status 07/01/2023 FINAL  Final   Organism ID, Bacteria METHICILLIN RESISTANT STAPHYLOCOCCUS AUREUS  Final      Susceptibility   Methicillin resistant staphylococcus aureus - MIC*    CIPROFLOXACIN >=8 RESISTANT Resistant     ERYTHROMYCIN >=8 RESISTANT Resistant     GENTAMICIN <=0.5 SENSITIVE Sensitive     OXACILLIN >=4 RESISTANT Resistant     TETRACYCLINE <=1 SENSITIVE Sensitive     VANCOMYCIN 1 SENSITIVE Sensitive     TRIMETH/SULFA <=10 SENSITIVE Sensitive     CLINDAMYCIN >=8 RESISTANT Resistant     RIFAMPIN <=0.5 SENSITIVE Sensitive     Inducible Clindamycin NEGATIVE Sensitive     LINEZOLID 2 SENSITIVE Sensitive     * METHICILLIN RESISTANT STAPHYLOCOCCUS AUREUS  Blood Culture ID Panel (Reflexed)     Status: Abnormal   Collection Time: 06/27/23  4:48 PM  Result Value Ref Range Status   Enterococcus faecalis NOT DETECTED NOT DETECTED Final   Enterococcus Faecium NOT DETECTED NOT DETECTED  Final   Listeria monocytogenes NOT DETECTED NOT DETECTED Final   Staphylococcus species DETECTED (A) NOT DETECTED Final    Comment: CRITICAL RESULT CALLED TO, READ BACK BY AND VERIFIED WITH: PHARMD K. AMEND [phone] @2101  FH    Staphylococcus aureus (BCID) NOT DETECTED NOT DETECTED Final   Staphylococcus epidermidis NOT DETECTED NOT DETECTED Final   Staphylococcus lugdunensis NOT DETECTED NOT DETECTED Final   Streptococcus species NOT DETECTED NOT DETECTED Final   Streptococcus agalactiae NOT DETECTED NOT DETECTED Final   Streptococcus pneumoniae NOT DETECTED NOT DETECTED Final   Streptococcus pyogenes NOT DETECTED NOT DETECTED Final   A.calcoaceticus-baumannii NOT DETECTED NOT DETECTED Final   Bacteroides fragilis NOT DETECTED NOT DETECTED Final   Enterobacterales NOT DETECTED NOT DETECTED Final   Enterobacter cloacae complex NOT DETECTED NOT DETECTED Final   Escherichia coli NOT DETECTED NOT DETECTED Final   Klebsiella aerogenes NOT DETECTED NOT DETECTED Final   Klebsiella oxytoca NOT  DETECTED NOT DETECTED Final   Klebsiella pneumoniae NOT DETECTED NOT DETECTED Final   Proteus species NOT DETECTED NOT DETECTED Final   Salmonella species NOT DETECTED NOT DETECTED Final   Serratia marcescens NOT DETECTED NOT DETECTED Final   Haemophilus influenzae NOT DETECTED NOT DETECTED Final   Neisseria meningitidis NOT DETECTED NOT DETECTED Final   Pseudomonas aeruginosa NOT DETECTED NOT DETECTED Final   Stenotrophomonas maltophilia NOT DETECTED NOT DETECTED Final   Candida albicans NOT DETECTED NOT DETECTED Final   Candida auris NOT DETECTED NOT DETECTED Final   Candida glabrata NOT DETECTED NOT DETECTED Final   Candida krusei NOT DETECTED NOT DETECTED Final   Candida parapsilosis NOT DETECTED NOT DETECTED Final   Candida tropicalis NOT DETECTED NOT DETECTED Final   Cryptococcus neoformans/gattii NOT DETECTED NOT DETECTED Final    Comment: Performed at [facility], [address], [place], [place] [address]  Culture, blood (Routine X 2) w Reflex to ID Panel     Status: None (Preliminary result)   Collection Time: 06/27/23  6:57 PM   Specimen: BLOOD RIGHT HAND  Result Value Ref Range Status   Specimen Description BLOOD RIGHT HAND  Final   Special Requests   Final    BOTTLES DRAWN AEROBIC ONLY Blood Culture results may not be optimal due to an inadequate volume of blood received in culture bottles   Culture   Final    NO GROWTH 4 DAYS Performed at [facility], [address], [place], [place] [address]    Report Status PENDING  Incomplete  Resp panel by RT-PCR (RSV, Flu A&B, Covid) Anterior Nasal Swab     Status: None   Collection Time: 06/27/23  8:00 PM   Specimen: Anterior Nasal Swab  Result Value Ref Range Status   SARS Coronavirus 2 by RT PCR NEGATIVE NEGATIVE Final   Influenza A by PCR NEGATIVE NEGATIVE Final   Influenza B by PCR NEGATIVE NEGATIVE Final    Comment: (NOTE) The Xpert Xpress SARS-CoV-2/FLU/RSV plus assay is intended as an aid in the  diagnosis of influenza from Nasopharyngeal swab specimens and should not be used as a sole basis for treatment. Nasal washings and aspirates are unacceptable for Xpert Xpress SARS-CoV-2/FLU/RSV testing.  Fact Sheet for Patients: BloggerCourse.com  Fact Sheet for Healthcare Providers: SeriousBroker.it  This test is not yet approved or cleared by the [place] FDA and has been authorized for detection and/or diagnosis of SARS-CoV-2 by FDA under an Emergency Use Authorization (EUA). This EUA will remain in effect (meaning  this test can be used) for the duration of the COVID-19 declaration under Section 564(b)(1) of the Act, 21 U.S.C. section 360bbb-3(b)(1), unless the authorization is terminated or revoked.     Resp Syncytial Virus by PCR NEGATIVE NEGATIVE Final    Comment: (NOTE) Fact Sheet for Patients: BloggerCourse.com  Fact Sheet for Healthcare Providers: SeriousBroker.it  This test is not yet approved or cleared by the [place] FDA and has been authorized for detection and/or diagnosis of SARS-CoV-2 by FDA under an Emergency Use Authorization (EUA). This EUA will remain in effect (meaning this test can be used) for the duration of the COVID-19 declaration under Section 564(b)(1) of the Act, 21 U.S.C. section 360bbb-3(b)(1), unless the authorization is terminated or revoked.  Performed at [facility], [address] [place], [place] [address]   Urine Culture     Status: None   Collection Time: 06/28/23  2:14 AM   Specimen: Urine, Random  Result Value Ref Range Status   Specimen Description URINE, RANDOM  Final   Special Requests NONE Reflexed from [id number]  Final   Culture   Final    NO GROWTH Performed at [facility], [address] [place], [place] [address]    Report Status 06/29/2023 FINAL  Final  MRSA Next Gen by PCR, Nasal     Status:  Abnormal   Collection Time: 06/29/23 10:25 PM   Specimen: Nasal Mucosa; Nasal Swab  Result Value Ref Range Status   MRSA by PCR Next Gen DETECTED (A) NOT DETECTED Final    Comment: RESULT CALLED TO, READ BACK BY AND VERIFIED WITH: Q WINSTON,RN@0044  06/30/23 MK (NOTE) The GeneXpert MRSA Assay (FDA approved for NASAL specimens only), is one component of a comprehensive MRSA colonization surveillance program. It is not intended to diagnose MRSA infection nor to guide or monitor treatment for MRSA infections. Test performance is not FDA approved in patients less than 74 years old. Performed at [facility], [address] [place], [place] [address]      Radiology Studies: No results found.   Scheduled Meds:   stroke: early stages of recovery book   Does not apply Once   amiodarone  200 mg Oral BID   aspirin EC  81 mg Oral Daily   aspirin  150 mg Rectal Daily   calcium acetate  667 mg Oral 3 times per day on Sunday Tuesday Thursday Saturday   And   calcium acetate  667 mg Oral 2 times per day on Monday Wednesday Friday   clopidogrel  300 mg Oral Once   clopidogrel  75 mg Oral Daily   dextrose  12.5 g Intravenous STAT   heparin  5,000 Units Subcutaneous Q8H   isosorbide mononitrate  30 mg Oral Daily   levothyroxine  50 mcg Oral Daily   metoprolol succinate  50 mg Oral Daily   mouth rinse  15 mL Mouth Rinse 4 times per day   pantoprazole  40 mg Oral BID   sertraline  100 mg Oral Daily   sodium chloride flush  3 mL Intravenous Once   Continuous Infusions:  vancomycin Stopped (06/30/23 1825)     LOS: 3 days   Hughie Closs, MD [facility]  07/01/2023, 11:16 AM   *Please note that this is a verbal dictation therefore any spelling or grammatical errors are due to the "Dragon Medical One" system interpretation.  Please page via Amion and do not message via secure chat for urgent patient  care matters. Secure chat can be used for non urgent patient care  matters.  How to contact the [facility] Attending or Consulting provider 7A - 7P or covering provider during after hours 7P -7A, for this patient?  Check the care team in [facility] and look for a) attending/consulting [facility] provider listed and b) the [facility] team listed. Page or secure chat 7A-7P. Log into [email or web address] and use [facility]'s universal password to access. If you do not have the password, please contact the hospital operator. Locate the [facility] provider you are looking for under [facility] and page to a number that you can be directly reached. If you still have difficulty reaching the provider, please page the [facility] (Director on Call) for the Hospitalists listed on amion for assistance.

## 2023-07-01 NOTE — Progress Notes (Signed)
Brief Pharmacy Note  Vancomycin loading dose was still infusing during first two hours of HD so maintenance dose was not given and random level was 11.  Will give another vanc 500mg  now.  Vernard Gambles, PharmD, BCPS 07/01/2023 2:56 AM
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
## 2023-07-01 NOTE — Progress Notes (Signed)
Speech Language Pathology Treatment: Dysphagia  Patient Details Name: [name] MRN: [id number] DOB: 09-18-1955 Today's Date: 07/01/2023 Time: 4098-1191 SLP Time Calculation (min) (ACUTE ONLY): 24 min  Assessment / Plan / Recommendation Clinical Impression  Pt much more alert and interactive today.  He continues with decreased initiation and delayed response times, but he answered biographical questions and asked appropriate questions of me and his friend at the bedside.  He accepted sequential sips of thin water from a straw and drank six ounces without difficulty. He consumed applesauce and pears with adequate oral attention and no oral residue after swallowing.  He needed cues to bite off pieces of a cracker and mastication took a subjectively longer time than with other consistencies.  Overall, he appears to be protecting his airway and is interested in eating.  Recommend starting with a dysphagia 2 diet, thin liquids; give meds whole in puree. D/W pt.  Sign hung over bed.  At this point, an MBS no longer is necessary. SLP will follow.    HPI HPI: Pt is a 68 yo male presenting with slurred speech and AMS. MRI revealed multiple scattered foci of acute infarct in the bilateral cerebral hemispheres. Pt has been seen multiple times by SLP in the past with dysphagia suspected to be primarily of esophageal origin. He has been on pureed diets up to mechanical soft diets, always with thin liquids. PMH includes: recent PNA (dx 1/7), stroke (2022), esophageal ca (2022), HH, CAD s/p CABG, combined CHF, ESRD, DMII, HTN, SVT, OSA, UGIB. Pt bed and w/c bound since hip fx last April and now resides at [facility].      SLP Plan  Continue with current plan of care      Recommendations for follow up therapy are one component of a multi-disciplinary discharge planning process, led by the attending physician.  Recommendations may be updated based on patient status, additional functional criteria and insurance  authorization.    Recommendations  Diet recommendations: Dysphagia 2 (fine chop);Thin liquid Liquids provided via: Cup;Straw Medication Administration: Whole meds with puree Supervision: Trained caregiver to feed patient                  Oral care BID   Frequent or constant Supervision/Assistance Dysphagia, unspecified (R13.10)     Continue with current plan of care    Ariam Mol L. [name], MA CCC/SLP Clinical Specialist - Acute Care SLP Acute Rehabilitation Services Office number [phone]  [name]  07/01/2023, 10:45 AM

## 2023-07-01 NOTE — Plan of Care (Signed)
Pt alert to self. UTA orientation due to pt limited speech. NIH stable. Vitals stable. NO compliants of pain. NO prns. Pt remains NPO. Awaiting MBS per notes and shift Report. BS q4 due to NPO status. Per dayshift RN Nicholes Calamity. Dr contacted due to NPO status and no fluid intake. Due to Dialysis no plans to start fluids at this time. 1 x dose of vanc given per pharmacy due to not given in Hemo  Problem: Education: Goal: Knowledge of disease or condition will improve Outcome: Not Progressing Goal: Knowledge of secondary prevention will improve (MUST DOCUMENT ALL) Outcome: Not Progressing Goal: Knowledge of patient specific risk factors will improve Loraine Leriche N/A or DELETE if not current risk factor) Outcome: Not Progressing   Problem: Health Behavior/Discharge Planning: Goal: Ability to manage health-related needs will improve Outcome: Not Progressing Goal: Goals will be collaboratively established with patient/family Outcome: Not Progressing   Problem: Self-Care: Goal: Ability to participate in self-care as condition permits will improve Outcome: Not Progressing Goal: Verbalization of feelings and concerns over difficulty with self-care will improve Outcome: Not Progressing Goal: Ability to communicate needs accurately will improve Outcome: Not Progressing   Problem: Nutrition: Goal: Risk of aspiration will decrease Outcome: Not Progressing Goal: Dietary intake will improve Outcome: Not Progressing   Problem: Clinical Measurements: Goal: Will remain free from infection Outcome: Not Progressing   Problem: Nutrition: Goal: Adequate nutrition will be maintained Outcome: Not Progressing   Problem: Ischemic Stroke/TIA Tissue Perfusion: Goal: Complications of ischemic stroke/TIA will be minimized Outcome: Progressing   Problem: Coping: Goal: Will verbalize positive feelings about self Outcome: Progressing Goal: Will identify appropriate support needs Outcome: Progressing   Problem:  Education: Goal: Ability to describe self-care measures that may prevent or decrease complications (Diabetes Survival Skills Education) will improve Outcome: Progressing Goal: Individualized Educational Video(s) Outcome: Progressing   Problem: Coping: Goal: Ability to adjust to condition or change in health will improve Outcome: Progressing   Problem: Fluid Volume: Goal: Ability to maintain a balanced intake and output will improve Outcome: Progressing   Problem: Health Behavior/Discharge Planning: Goal: Ability to identify and utilize available resources and services will improve Outcome: Progressing Goal: Ability to manage health-related needs will improve Outcome: Progressing   Problem: Metabolic: Goal: Ability to maintain appropriate glucose levels will improve Outcome: Progressing   Problem: Nutritional: Goal: Maintenance of adequate nutrition will improve Outcome: Progressing Goal: Progress toward achieving an optimal weight will improve Outcome: Progressing   Problem: Skin Integrity: Goal: Risk for impaired skin integrity will decrease Outcome: Progressing   Problem: Tissue Perfusion: Goal: Adequacy of tissue perfusion will improve Outcome: Progressing   Problem: Education: Goal: Knowledge of General Education information will improve Description: Including pain rating scale, medication(s)/side effects and non-pharmacologic comfort measures Outcome: Progressing   Problem: Health Behavior/Discharge Planning: Goal: Ability to manage health-related needs will improve Outcome: Progressing   Problem: Clinical Measurements: Goal: Ability to maintain clinical measurements within normal limits will improve Outcome: Progressing Goal: Diagnostic test results will improve Outcome: Progressing Goal: Respiratory complications will improve Outcome: Progressing Goal: Cardiovascular complication will be avoided Outcome: Progressing   Problem: Activity: Goal: Risk for  activity intolerance will decrease Outcome: Progressing   Problem: Coping: Goal: Level of anxiety will decrease Outcome: Progressing   Problem: Elimination: Goal: Will not experience complications related to bowel motility Outcome: Progressing Goal: Will not experience complications related to urinary retention Outcome: Progressing   Problem: Pain Managment: Goal: General experience of comfort will improve and/or be controlled Outcome: Progressing  Problem: Safety: Goal: Ability to remain free from injury will improve Outcome: Progressing   Problem: Skin Integrity: Goal: Risk for impaired skin integrity will decrease Outcome: Progressing

## 2023-07-01 NOTE — Plan of Care (Signed)
Diet advanced. Patient is more responsive today. MBSS not needed.   Problem: Ischemic Stroke/TIA Tissue Perfusion: Goal: Complications of ischemic stroke/TIA will be minimized Outcome: Not Met (add Reason)   Problem: Self-Care: Goal: Ability to participate in self-care as condition permits will improve Outcome: Not Met (add Reason) Goal: Verbalization of feelings and concerns over difficulty with self-care will improve Outcome: Not Met (add Reason) Goal: Ability to communicate needs accurately will improve Outcome: Not Met (add Reason)   Problem: Nutrition: Goal: Risk of aspiration will decrease Outcome: Not Met (add Reason) Goal: Dietary intake will improve Outcome: Not Met (add Reason)   Problem: Fluid Volume: Goal: Ability to maintain a balanced intake and output will improve Outcome: Not Met (add Reason)   Problem: Nutritional: Goal: Maintenance of adequate nutrition will improve Outcome: Not Met (add Reason) Goal: Progress toward achieving an optimal weight will improve Outcome: Not Met (add Reason)   Problem: Skin Integrity: Goal: Risk for impaired skin integrity will decrease Outcome: Not Met (add Reason)   Problem: Tissue Perfusion: Goal: Adequacy of tissue perfusion will improve Outcome: Not Met (add Reason)   Problem: Elimination: Goal: Will not experience complications related to bowel motility Outcome: Not Met (add Reason) Goal: Will not experience complications related to urinary retention Outcome: Not Met (add Reason)   Problem: Pain Managment: Goal: General experience of comfort will improve and/or be controlled Outcome: Not Met (add Reason)   Problem: Safety: Goal: Ability to remain free from injury will improve Outcome: Not Met (add Reason)   Problem: Skin Integrity: Goal: Risk for impaired skin integrity will decrease Outcome: Not Met (add Reason)

## 2023-07-02 DIAGNOSIS — I639 Cerebral infarction, unspecified: Secondary | ICD-10-CM | POA: Diagnosis not present

## 2023-07-02 LAB — CULTURE, BLOOD (ROUTINE X 2): Culture: NO GROWTH

## 2023-07-02 LAB — GLUCOSE, CAPILLARY
Glucose-Capillary: 108 mg/dL — ABNORMAL HIGH (ref 70–99)
Glucose-Capillary: 85 mg/dL (ref 70–99)
Glucose-Capillary: 97 mg/dL (ref 70–99)

## 2023-07-02 NOTE — Progress Notes (Signed)
PROGRESS NOTE    [name]  NWG:[id number] DOB: 09-24-1955 DOA: 06/27/2023 PCP: Babs Sciara, MD   Brief Narrative:  [name] is a 68 y.o. male with medical history significant of CAD s/p CABG, combined CHF, ESRD MWF, type 2 diabetes, hypertension, SVT, OSA, upper GI bleed who presented with slurred speech and AMS.   Pt moved into SNF about 9 months ago after a hospitalization. He was living along prior to that. He fractured his right hip last April and had declined and is now mostly bedbound and wheelchair bound. Caretaker last saw him well about 6 days ago on 1/9. He was diagnosed with right sided pneumonia on 1/7. Last dialysis session was Thursday 1/9. Normally schedule is MWF but he reportedly refused it a day prior which is unlike him.   Upon arrival, he was hemodynamically stable.  He presented as a code stroke and was reported to be disoriented to month, not following commands, bilateral arm weakness, bilateral leg weakness, aphasia, dysarthria on exam.  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  Severe stenosis in the proximal right super clinoid ICA and moderate stenosis in the left supraclinoid ICA.  60% stenosis in the proximal right ICA severe stenosis at the origin of the right vertebral artery with additional severe stenosis in the right V2 segment. Chest x-ray demonstrated small right pleural effusion with patchy opacity.  There is also streaky left basilar opacity. CBC without leukocytosis, anemia stable with hemoglobin of 10.7.  BMP with creatinine of 5.66 with his previous ranging around 3-5.  Further details below.  Assessment & Plan:   Principal Problem:   Stroke [facility]) Active Problems:   Chronic combined systolic and diastolic CHF (congestive heart failure) (HCC)   HTN (hypertension)   Pleural effusion   Type 2 diabetes mellitus (HCC)   Obstructive  sleep apnea   S/P CABG (coronary artery bypass graft)   ESRD (end stage renal disease) on dialysis (HCC)   Abnormal TSH   History of supraventricular tachycardia   Acute ischemic stroke (HCC)   Bacteremia   Pressure injury of skin  Bilateral acute ischemic stroke (HCC) Pt presented with aphasia, generalized weakness from SNF -CTA head and neck demonstrated numerous areas of stenosis including 60% stenosis in the proximal right ICA. MRI brain shows Multiple scattered foci of acute infarct in the bilateral cerebral Hemispheres.  Patient started on aspirin and Plavix and neuro recommends to continue DAPT for 3 weeks followed by aspirin alone.  Due to patient being n.p.o and failing swallow he was on aspirin suppository but passed swallow on 07/01/2023 so all oral medications were given to him. Neurology following. 2D Echo ejection fraction 45 to 50%.  Left atrium moderately dilated severe mitral annulus calcification LDL 24 HgbA1c 8.1 VTE prophylaxis -subcutaneous heparin  Dysphagia: Passed bedside swallow today, details as above.  Possible aspiration pneumonia/right pleural effusion which is trivial/possible UTI?: Chest x-ray shows right pleural effusion and atelectasis and patchy right basilar opacity/infiltrate. No leukocytosis and he is afebrile but procalcitonin slightly elevated.  Patient appears comfortable but aphasic so unable to provide any history.  Cannot rule out aspiration pneumonia clinically.  Patient on vancomycin transition from Rocephin to Zithromax since admission.  Gram-positive # MRSA bacteremia: Patient was found to have MRSA bacteremia upon admission from cultures drawn on 06/27/2023.  Source remains unclear.  Repeat cultures not done since  the goal of care conversation was underway.  I have repeated cultures today.  Discussed with the sister and she is okay with proceeding with TEE.  Will consult cardiology.  I have also notified ID to review patient's case.   Chronic  systolic congestive heart failure: Latest echo done in September 2024 shows 35 to 40% ejection fraction.  Patient appears euvolemic.   HTN (hypertension) Blood pressure improving, should be able to take p.o. medications, continue Imdur, Toprol-XL.     Type 2 diabetes mellitus (HCC) - Uncontrolled.  Hemoglobin A1c of 8.1 in March and 6.4 this admission.  Continue SSI.   Obstructive sleep apnea -not on CPAP but has nighttime O2 supplement   History of supraventricular tachycardia - Continue amiodarone and beta-blocker however patient is NPO.   Abnormal TSH -TSH elevated to 11 but was just normal in September.  On Synthroid 37.5 mcg, increased to 50 mcg.   ESRD (end stage renal disease) on dialysis [facility]) -scheduled MWF. Last session on Thursday prior to admission and then on Wednesday.  Appreciate nephrology help.     S/P CABG (coronary artery bypass graft) -continue aspirin  History of SVT: On amiodarone PTA, was on hold due to being n.p.o. but no reports of SVT during this hospitalization.  Resumed amiodarone.   Goal of care: Patient was already at the nursing home and mostly bedbound due to previous strokes and multiple comorbidities, now has incomprehensible speech at times and unable to move extremities at all and failing swallow evaluation.  Carries very poor prognosis.  Palliative care consulted.  Family discussions took place.  Since he has improved after dialysis, family wants to continue aggressive care.  DVT prophylaxis: heparin injection 5,000 Units Start: 06/27/23 2200   Code Status: Limited: Do not attempt resuscitation (DNR) -DNR-LIMITED -Do Not Intubate/DNI   Family Communication:  None present at bedside.  Discussed with the sister Amy Vandeusen over the phone.  Status is: Inpatient Remains inpatient appropriate because: Will need TEE.     Estimated body mass index is 22.84 kg/m as calculated from the following:   Height as of this encounter: 5\' 10"  (1.778 m).    Weight as of this encounter: 72.2 kg.  Pressure Injury 06/28/23 Heel Left;Medial Stage 1 -  Intact skin with non-blanchable redness of a localized area usually over a bony prominence. (Active)  06/28/23 1530  Location: Heel  Location Orientation: Left;Medial  Staging: Stage 1 -  Intact skin with non-blanchable redness of a localized area usually over a bony prominence.  Wound Description (Comments):   Present on Admission: Yes  Dressing Type Foam - Lift dressing to assess site every shift 07/01/23 2045     Pressure Injury 06/28/23 Ankle Right;Lateral Stage 1 -  Intact skin with non-blanchable redness of a localized area usually over a bony prominence. (Active)  06/28/23 1530  Location: Ankle  Location Orientation: Right;Lateral  Staging: Stage 1 -  Intact skin with non-blanchable redness of a localized area usually over a bony prominence.  Wound Description (Comments):   Present on Admission: Yes  Dressing Type Foam - Lift dressing to assess site every shift 07/01/23 2045     Pressure Injury 06/28/23 Coccyx Medial Stage 2 -  Partial thickness loss of dermis presenting as a shallow open injury with a red, pink wound bed without slough. (Active)  06/28/23 1530  Location: Coccyx  Location Orientation: Medial  Staging: Stage 2 -  Partial thickness loss of dermis presenting as a shallow open injury with  a red, pink wound bed without slough.  Wound Description (Comments):   Present on Admission: Yes  Dressing Type Foam - Lift dressing to assess site every shift 07/01/23 2045   Nutritional Assessment: Body mass index is 22.84 kg/m.Marland Kitchen Seen by dietician.  I agree with the assessment and plan as outlined below: Nutrition Status:        . Skin Assessment: I have examined the patient's skin and I agree with the wound assessment as performed by the wound care RN as outlined below: Pressure Injury 06/28/23 Heel Left;Medial Stage 1 -  Intact skin with non-blanchable redness of a localized  area usually over a bony prominence. (Active)  06/28/23 1530  Location: Heel  Location Orientation: Left;Medial  Staging: Stage 1 -  Intact skin with non-blanchable redness of a localized area usually over a bony prominence.  Wound Description (Comments):   Present on Admission: Yes  Dressing Type Foam - Lift dressing to assess site every shift 07/01/23 2045     Pressure Injury 06/28/23 Ankle Right;Lateral Stage 1 -  Intact skin with non-blanchable redness of a localized area usually over a bony prominence. (Active)  06/28/23 1530  Location: Ankle  Location Orientation: Right;Lateral  Staging: Stage 1 -  Intact skin with non-blanchable redness of a localized area usually over a bony prominence.  Wound Description (Comments):   Present on Admission: Yes  Dressing Type Foam - Lift dressing to assess site every shift 07/01/23 2045     Pressure Injury 06/28/23 Coccyx Medial Stage 2 -  Partial thickness loss of dermis presenting as a shallow open injury with a red, pink wound bed without slough. (Active)  06/28/23 1530  Location: Coccyx  Location Orientation: Medial  Staging: Stage 2 -  Partial thickness loss of dermis presenting as a shallow open injury with a red, pink wound bed without slough.  Wound Description (Comments):   Present on Admission: Yes  Dressing Type Foam - Lift dressing to assess site every shift 07/01/23 2045    Consultants:  Neurology  Procedures:  None  Antimicrobials:  Anti-infectives (From admission, onward)    Start     Dose/Rate Route Frequency Ordered Stop   07/03/23 1200  vancomycin (VANCOREADY) IVPB 750 mg/150 mL  Status:  Discontinued       Placed in "Followed by" Linked Group   750 mg 150 mL/hr over 60 Minutes Intravenous Every M-W-F (Hemodialysis) 06/30/23 1016 06/30/23 1440   07/01/23 0345  vancomycin (VANCOREADY) IVPB 500 mg/100 mL        500 mg 100 mL/hr over 60 Minutes Intravenous  Once 07/01/23 0257 07/01/23 0414   06/30/23 1600   vancomycin (VANCOREADY) IVPB 750 mg/150 mL  Status:  Discontinued       Placed in "Followed by" Linked Group   750 mg 150 mL/hr over 60 Minutes Intravenous Every M-W-F (Hemodialysis) 06/30/23 1440 06/30/23 1507   06/30/23 1600  vancomycin (VANCOREADY) IVPB 750 mg/150 mL       Placed in "Followed by" Linked Group   750 mg 150 mL/hr over 60 Minutes Intravenous Every M-W-F (Hemodialysis) 06/30/23 1507     06/30/23 1100  Vancomycin (VANCOCIN) 1,500 mg in sodium chloride 0.9 % 500 mL IVPB       Placed in "Followed by" Linked Group   1,500 mg 250 mL/hr over 120 Minutes Intravenous  Once 06/30/23 1016 06/30/23 1510   06/28/23 0700  doxycycline (VIBRAMYCIN) 100 mg in dextrose 5 % 250 mL IVPB  Status:  Discontinued  100 mg 125 mL/hr over 120 Minutes Intravenous Every 12 hours 06/27/23 2028 06/30/23 1104   06/27/23 1700  azithromycin (ZITHROMAX) 500 mg in sodium chloride 0.9 % 250 mL IVPB  Status:  Discontinued        500 mg 250 mL/hr over 60 Minutes Intravenous Every 24 hours 06/27/23 1650 06/27/23 2028   06/27/23 1700  cefTRIAXone (ROCEPHIN) 1 g in sodium chloride 0.9 % 100 mL IVPB  Status:  Discontinued        1 g 200 mL/hr over 30 Minutes Intravenous Every 24 hours 06/27/23 1650 06/30/23 1104         Subjective: Patient seen and examined.  Less interactive than yesterday.  He said " not feeling good" but he could not elaborate further.  Per nursing, he is refusing meds today.  Objective: Vitals:   07/01/23 1930 07/02/23 0100 07/02/23 0600 07/02/23 0850  BP: (!) 130/58 (!) 154/66 (!) 162/70 (!) 153/73  Pulse: 74 73 69 71  Resp: 18 20 20    Temp: 98.9 F (37.2 C) 98.7 F (37.1 C) 97.7 F (36.5 C) 98.1 F (36.7 C)  TempSrc: Oral Axillary Axillary Oral  SpO2: 96% 97% 92% 94%  Weight:      Height:        Intake/Output Summary (Last 24 hours) at 07/02/2023 1013 Last data filed at 07/01/2023 2045 Gross per 24 hour  Intake 260 ml  Output 300 ml  Net -40 ml   Filed Weights    06/28/23 1804 06/28/23 2158 06/30/23 1450  Weight: 74.8 kg 72.2 kg 72.2 kg    Examination:  General exam: Appears calm and comfortable  Respiratory system: Clear to auscultation. Respiratory effort normal. Cardiovascular system: S1 & S2 heard, RRR. No JVD, murmurs, rubs, gallops or clicks. No pedal edema. Gastrointestinal system: Abdomen is nondistended, soft and nontender. No organomegaly or masses felt. Normal bowel sounds heard. Central nervous system: Alert but unable to assess orientation.  Not moving any extremities.   Data Reviewed: I have personally reviewed following labs and imaging studies  CBC: Recent Labs  Lab 06/27/23 1521 06/27/23 1540 06/28/23 1814 06/30/23 1556  WBC  --  7.9 8.0 7.0  NEUTROABS  --  6.1  --   --   HGB 11.9* 10.7* 9.8* 10.2*  HCT 35.0* 36.3* 32.4* 33.7*  MCV  --  82.1 81.2 81.4  PLT  --  223 235 247   Basic Metabolic Panel: Recent Labs  Lab 06/27/23 1521 06/27/23 1540 06/28/23 1814 06/30/23 1556  NA 136 136 136 137  K 4.0 4.1 4.0 3.5  CL 97* 96* 97* 98  CO2  --  25 20* 23  GLUCOSE 102* 101* 89 72  BUN 38* 39* 47* 33*  CREATININE 5.80* 5.66* 6.30* 5.03*  CALCIUM  --  8.8* 8.4* 8.2*  PHOS  --   --  4.6 4.3   GFR: Estimated Creatinine Clearance: 14.8 mL/min (A) (by C-G formula based on SCr of 5.03 mg/dL (H)). Liver Function Tests: Recent Labs  Lab 06/27/23 1540 06/28/23 1814 06/30/23 1556  AST 11*  --   --   ALT 7  --   --   ALKPHOS 91  --   --   BILITOT 1.1  --   --   PROT 6.3*  --   --   ALBUMIN 2.7* 2.6* 2.5*   No results for input(s): "LIPASE", "AMYLASE" in the last 168 hours. No results for input(s): "AMMONIA" in the last 168 hours. Coagulation Profile:  Recent Labs  Lab 06/27/23 1540  INR 1.1   Cardiac Enzymes: No results for input(s): "CKTOTAL", "CKMB", "CKMBINDEX", "TROPONINI" in the last 168 hours. BNP (last 3 results) No results for input(s): "PROBNP" in the last 8760 hours. HbA1C: No results for input(s):  "HGBA1C" in the last 72 hours.  CBG: Recent Labs  Lab 07/01/23 0323 07/01/23 0730 07/01/23 1158 07/01/23 1758 07/02/23 0612  GLUCAP 73 71 133* 103* 85   Lipid Profile: No results for input(s): "CHOL", "HDL", "LDLCALC", "TRIG", "CHOLHDL", "LDLDIRECT" in the last 72 hours.  Thyroid Function Tests: No results for input(s): "TSH", "T4TOTAL", "FREET4", "T3FREE", "THYROIDAB" in the last 72 hours.  Anemia Panel: No results for input(s): "VITAMINB12", "FOLATE", "FERRITIN", "TIBC", "IRON", "RETICCTPCT" in the last 72 hours. Sepsis Labs: Recent Labs  Lab 06/27/23 1548 06/27/23 1902 06/27/23 1919  PROCALCITON  --   --  0.35  LATICACIDVEN 1.3 1.1  --     Recent Results (from the past 240 hours)  Culture, blood (Routine X 2) w Reflex to ID Panel     Status: Abnormal   Collection Time: 06/27/23  4:48 PM   Specimen: BLOOD RIGHT ARM  Result Value Ref Range Status   Specimen Description BLOOD RIGHT ARM  Final   Special Requests   Final    BOTTLES DRAWN AEROBIC AND ANAEROBIC Blood Culture results may not be optimal due to an inadequate volume of blood received in culture bottles   Culture  Setup Time   Final    GRAM POSITIVE COCCI IN CLUSTERS IN BOTH AEROBIC AND ANAEROBIC BOTTLES CRITICAL RESULT CALLED TO, READ BACK BY AND VERIFIED WITH: PHARMD K. AMEND [id number] @2101  FH    Culture (A)  Final    STAPHYLOCOCCUS HOMINIS METHICILLIN RESISTANT STAPHYLOCOCCUS AUREUS THE SIGNIFICANCE OF ISOLATING THIS ORGANISM FROM A SINGLE SET OF BLOOD CULTURES WHEN MULTIPLE SETS ARE DRAWN IS UNCERTAIN. PLEASE NOTIFY THE MICROBIOLOGY DEPARTMENT WITHIN ONE WEEK IF SPECIATION AND SENSITIVITIES ARE REQUIRED. FOR STAPHYLOCOCCUS HOMINIS CRITICAL RESULT CALLED TO, READ BACK BY AND VERIFIED WITH: PHARMD B AGEE [id number] AT 1010 BY CM Performed at [facility], [address] [place], [place] [address]    Report Status 07/01/2023 FINAL  Final   Organism ID, Bacteria METHICILLIN RESISTANT STAPHYLOCOCCUS AUREUS   Final      Susceptibility   Methicillin resistant staphylococcus aureus - MIC*    CIPROFLOXACIN >=8 RESISTANT Resistant     ERYTHROMYCIN >=8 RESISTANT Resistant     GENTAMICIN <=0.5 SENSITIVE Sensitive     OXACILLIN >=4 RESISTANT Resistant     TETRACYCLINE <=1 SENSITIVE Sensitive     VANCOMYCIN 1 SENSITIVE Sensitive     TRIMETH/SULFA <=10 SENSITIVE Sensitive     CLINDAMYCIN >=8 RESISTANT Resistant     RIFAMPIN <=0.5 SENSITIVE Sensitive     Inducible Clindamycin NEGATIVE Sensitive     LINEZOLID 2 SENSITIVE Sensitive     * METHICILLIN RESISTANT STAPHYLOCOCCUS AUREUS  Blood Culture ID Panel (Reflexed)     Status: Abnormal   Collection Time: 06/27/23  4:48 PM  Result Value Ref Range Status   Enterococcus faecalis NOT DETECTED NOT DETECTED Final   Enterococcus Faecium NOT DETECTED NOT DETECTED Final   Listeria monocytogenes NOT DETECTED NOT DETECTED Final   Staphylococcus species DETECTED (A) NOT DETECTED Final    Comment: CRITICAL RESULT CALLED TO, READ BACK BY AND VERIFIED WITH: PHARMD K. AMEND [id number] @2101  FH    Staphylococcus aureus (BCID) NOT DETECTED NOT DETECTED Final   Staphylococcus epidermidis NOT DETECTED  NOT DETECTED Final   Staphylococcus lugdunensis NOT DETECTED NOT DETECTED Final   Streptococcus species NOT DETECTED NOT DETECTED Final   Streptococcus agalactiae NOT DETECTED NOT DETECTED Final   Streptococcus pneumoniae NOT DETECTED NOT DETECTED Final   Streptococcus pyogenes NOT DETECTED NOT DETECTED Final   A.calcoaceticus-baumannii NOT DETECTED NOT DETECTED Final   Bacteroides fragilis NOT DETECTED NOT DETECTED Final   Enterobacterales NOT DETECTED NOT DETECTED Final   Enterobacter cloacae complex NOT DETECTED NOT DETECTED Final   Escherichia coli NOT DETECTED NOT DETECTED Final   Klebsiella aerogenes NOT DETECTED NOT DETECTED Final   Klebsiella oxytoca NOT DETECTED NOT DETECTED Final   Klebsiella pneumoniae NOT DETECTED NOT DETECTED Final   Proteus species NOT  DETECTED NOT DETECTED Final   Salmonella species NOT DETECTED NOT DETECTED Final   Serratia marcescens NOT DETECTED NOT DETECTED Final   Haemophilus influenzae NOT DETECTED NOT DETECTED Final   Neisseria meningitidis NOT DETECTED NOT DETECTED Final   Pseudomonas aeruginosa NOT DETECTED NOT DETECTED Final   Stenotrophomonas maltophilia NOT DETECTED NOT DETECTED Final   Candida albicans NOT DETECTED NOT DETECTED Final   Candida auris NOT DETECTED NOT DETECTED Final   Candida glabrata NOT DETECTED NOT DETECTED Final   Candida krusei NOT DETECTED NOT DETECTED Final   Candida parapsilosis NOT DETECTED NOT DETECTED Final   Candida tropicalis NOT DETECTED NOT DETECTED Final   Cryptococcus neoformans/gattii NOT DETECTED NOT DETECTED Final    Comment: Performed at [facility], [address], [place], [place] [address]  Culture, blood (Routine X 2) w Reflex to ID Panel     Status: None   Collection Time: 06/27/23  6:57 PM   Specimen: BLOOD RIGHT HAND  Result Value Ref Range Status   Specimen Description BLOOD RIGHT HAND  Final   Special Requests   Final    BOTTLES DRAWN AEROBIC ONLY Blood Culture results may not be optimal due to an inadequate volume of blood received in culture bottles   Culture   Final    NO GROWTH 5 DAYS Performed at [facility], [address] [place], [place] [address]    Report Status 07/02/2023 FINAL  Final  Resp panel by RT-PCR (RSV, Flu A&B, Covid) Anterior Nasal Swab     Status: None   Collection Time: 06/27/23  8:00 PM   Specimen: Anterior Nasal Swab  Result Value Ref Range Status   SARS Coronavirus 2 by RT PCR NEGATIVE NEGATIVE Final   Influenza A by PCR NEGATIVE NEGATIVE Final   Influenza B by PCR NEGATIVE NEGATIVE Final    Comment: (NOTE) The Xpert Xpress SARS-CoV-2/FLU/RSV plus assay is intended as an aid in the diagnosis of influenza from Nasopharyngeal swab specimens and should not be used as a sole basis for treatment. Nasal washings  and aspirates are unacceptable for Xpert Xpress SARS-CoV-2/FLU/RSV testing.  Fact Sheet for Patients: BloggerCourse.com  Fact Sheet for Healthcare Providers: SeriousBroker.it  This test is not yet approved or cleared by the [place] FDA and has been authorized for detection and/or diagnosis of SARS-CoV-2 by FDA under an Emergency Use Authorization (EUA). This EUA will remain in effect (meaning this test can be used) for the duration of the COVID-19 declaration under Section 564(b)(1) of the Act, 21 U.S.C. section 360bbb-3(b)(1), unless the authorization is terminated or revoked.     Resp Syncytial Virus by PCR NEGATIVE NEGATIVE Final    Comment: (NOTE) Fact Sheet for Patients: BloggerCourse.com  Fact Sheet for Healthcare Providers:  SeriousBroker.it  This test is not yet approved or cleared by the [place] and has been authorized for detection and/or diagnosis of SARS-CoV-2 by FDA under an Emergency Use Authorization (EUA). This EUA will remain in effect (meaning this test can be used) for the duration of the COVID-19 declaration under Section 564(b)(1) of the Act, 21 U.S.C. section 360bbb-3(b)(1), unless the authorization is terminated or revoked.  Performed at [facility], [address], [place], [place] [address]   Urine Culture     Status: None   Collection Time: 06/28/23  2:14 AM   Specimen: Urine, Random  Result Value Ref Range Status   Specimen Description URINE, RANDOM  Final   Special Requests NONE Reflexed from [id number]  Final   Culture   Final    NO GROWTH Performed at [facility], [address], [place], [place] [address]    Report Status 06/29/2023 FINAL  Final  MRSA Next Gen by PCR, Nasal     Status: Abnormal   Collection Time: 06/29/23 10:25 PM   Specimen: Nasal Mucosa; Nasal Swab  Result Value Ref Range Status   MRSA by PCR  Next Gen DETECTED (A) NOT DETECTED Final    Comment: RESULT CALLED TO, READ BACK BY AND VERIFIED WITH: Q WINSTON,RN@0044  06/30/23 MK (NOTE) The GeneXpert MRSA Assay (FDA approved for NASAL specimens only), is one component of a comprehensive MRSA colonization surveillance program. It is not intended to diagnose MRSA infection nor to guide or monitor treatment for MRSA infections. Test performance is not FDA approved in patients less than 35 years old. Performed at [facility], [address], [place], [place] [address]      Radiology Studies: No results found.   Scheduled Meds:   stroke: early stages of recovery book   Does not apply Once   amiodarone  200 mg Oral BID   aspirin EC  81 mg Oral Daily   calcium acetate  667 mg Oral 3 times per day on Sunday Tuesday Thursday Saturday   And   calcium acetate  667 mg Oral 2 times per day on Monday Wednesday Friday   clopidogrel  75 mg Oral Daily   feeding supplement  237 mL Oral BID BM   heparin  5,000 Units Subcutaneous Q8H   isosorbide mononitrate  30 mg Oral Daily   levothyroxine  50 mcg Oral Daily   metoprolol succinate  50 mg Oral Daily   mouth rinse  15 mL Mouth Rinse 4 times per day   pantoprazole  40 mg Oral BID   sertraline  100 mg Oral Daily   sodium chloride flush  3 mL Intravenous Once   Continuous Infusions:  vancomycin Stopped (06/30/23 1825)     LOS: 4 days   Hughie Closs, MD [facility]  07/02/2023, 10:13 AM   *Please note that this is a verbal dictation therefore any spelling or grammatical errors are due to the "Dragon Medical One" system interpretation.  Please page via Amion and do not message via secure chat for urgent patient care matters. Secure chat can be used for non urgent patient care matters.  How to contact the [facility] Attending or Consulting provider 7A - 7P or covering provider during after hours 7P -7A, for this patient?  Check the care team in [facility] and look for a)  attending/consulting [facility] provider listed and b) the [facility] team listed. Page or secure chat 7A-7P. Log into [email or web address] and use [place]'s  universal password to access. If you do not have the password, please contact the hospital operator. Locate the [facility] provider you are looking for under [facility] and page to a number that you can be directly reached. If you still have difficulty reaching the provider, please page the [facility] (Director on Call) for the Hospitalists listed on amion for assistance.

## 2023-07-02 NOTE — Progress Notes (Signed)
Patient refusing morning medications. RN was able to administer Plavix and Phoslo before patient advised RN he would not take any additional medications.  MD Pahwani informed.

## 2023-07-02 NOTE — Plan of Care (Signed)
  Problem: Health Behavior/Discharge Planning: Goal: Ability to manage health-related needs will improve Outcome: Progressing   Problem: Health Behavior/Discharge Planning: Goal: Goals will be collaboratively established with patient/family Outcome: Progressing   Problem: Self-Care: Goal: Ability to participate in self-care as condition permits will improve Outcome: Progressing

## 2023-07-02 NOTE — Progress Notes (Addendum)
Family wants to pursue full work up  Will ask cardiology tomorrow for tee Primary team had placed repeat blood cultures Continue staph aureus coverage (mrsa) with vanc

## 2023-07-02 NOTE — Progress Notes (Signed)
Admit: 06/27/2023 LOS: 4  84M ESRD s/p CVA  Subjective:  Mental status improved, passed swallow eval, more engaging and communicative Plan is to continue dialysis and see how much recovery he makes Seen in the room  01/18 0701 - 01/19 0700 In: 260 [P.O.:260] Out: 300 [Urine:300]  Filed Weights   06/28/23 1804 06/28/23 2158 06/30/23 1450  Weight: 74.8 kg 72.2 kg 72.2 kg    Scheduled Meds:   stroke: early stages of recovery book   Does not apply Once   amiodarone  200 mg Oral BID   aspirin EC  81 mg Oral Daily   calcium acetate  667 mg Oral 3 times per day on Sunday Tuesday Thursday Saturday   And   calcium acetate  667 mg Oral 2 times per day on Monday Wednesday Friday   clopidogrel  75 mg Oral Daily   feeding supplement  237 mL Oral BID BM   heparin  5,000 Units Subcutaneous Q8H   isosorbide mononitrate  30 mg Oral Daily   levothyroxine  50 mcg Oral Daily   metoprolol succinate  50 mg Oral Daily   mouth rinse  15 mL Mouth Rinse 4 times per day   pantoprazole  40 mg Oral BID   sertraline  100 mg Oral Daily   sodium chloride flush  3 mL Intravenous Once   Continuous Infusions:  vancomycin Stopped (06/30/23 1825)   PRN Meds:.acetaminophen **OR** acetaminophen (TYLENOL) oral liquid 160 mg/5 mL **OR** acetaminophen, morphine injection, mouth rinse, senna-docusate  Current Labs: reviewed    Physical Exam:  Blood pressure (!) 153/73, pulse 71, temperature 98.1 F (36.7 C), temperature source Oral, resp. rate 20, height 5\' 10"  (1.778 m), weight 72.2 kg, SpO2 94%. General: Lying in bed, eyes open and tracks Head: NCAT sclera not icteric MMM Neck: Supple. Lungs: Clear bilaterally. Normal WOB Heart: RRR, no murmur, rub, or gallop  Abdomen: soft non-tender no masses  Lower extremities:without edema  Dialysis Access: L AVF   Dialysis Orders:  [facility] MWF 4:00 400/600 EDW 75.3kg 2K/2.5Ca AVF Hep 5000 Last HD 1/10.  Mircera 200 q 2wks (last 06/16/23) Hectorol 1mg  TIW    Assessment/Plan: Acute CVA - multiple scattered infarcts on MRI.  Some improvement in mental status in the past 24 hours  acute encephalopathy as above, improved somewhat  ESRD -  HD MWF.  Plan to continue as an outpatient, next treatment 1/20.   Hypertension/volume gentle UF with dialysis.  Anemia  - Hgb stable around 10, CTM  Metabolic bone disease -  Calcium and P acceptable. 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 Morphine should not be used.  Baclofen should be avoided Avoid oral sodium phosphate and magnesium citrate based laxatives / bowel preps   Arita Miss, MD  [facility] 06/28/2023, 12:45 PM   Sabra Heck MD 07/02/2023, 10:02 AM  Recent Labs  Lab 06/27/23 1540 06/28/23 1814 06/30/23 1556  NA 136 136 137  K 4.1 4.0 3.5  CL 96* 97* 98  CO2 25 20* 23  GLUCOSE 101* 89 72  BUN 39* 47* 33*  CREATININE 5.66* 6.30* 5.03*  CALCIUM 8.8* 8.4* 8.2*  PHOS  --  4.6 4.3   Recent Labs  Lab 06/27/23 1540 06/28/23 1814 06/30/23 1556  WBC 7.9 8.0 7.0  NEUTROABS 6.1  --   --  HGB 10.7* 9.8* 10.2*  HCT 36.3* 32.4* 33.7*  MCV 82.1 81.2 81.4  PLT 223 235 247

## 2023-07-03 ENCOUNTER — Ambulatory Visit: Payer: 59 | Admitting: Internal Medicine

## 2023-07-03 DIAGNOSIS — I639 Cerebral infarction, unspecified: Secondary | ICD-10-CM | POA: Diagnosis not present

## 2023-07-03 DIAGNOSIS — Z515 Encounter for palliative care: Secondary | ICD-10-CM | POA: Diagnosis not present

## 2023-07-03 DIAGNOSIS — R7881 Bacteremia: Secondary | ICD-10-CM | POA: Diagnosis not present

## 2023-07-03 DIAGNOSIS — Z7189 Other specified counseling: Secondary | ICD-10-CM | POA: Diagnosis not present

## 2023-07-03 LAB — GLUCOSE, CAPILLARY: Glucose-Capillary: 118 mg/dL — ABNORMAL HIGH (ref 70–99)

## 2023-07-03 MED ORDER — VANCOMYCIN HCL 750 MG IV SOLR
750.0000 mg | INTRAVENOUS | Status: DC
  Administered 2023-07-03 – 2023-07-14 (×6): 750 mg via INTRAVENOUS
  Filled 2023-07-03 (×7): qty 15

## 2023-07-03 MED ORDER — DARBEPOETIN ALFA 100 MCG/0.5ML IJ SOSY
100.0000 ug | PREFILLED_SYRINGE | INTRAMUSCULAR | Status: DC
Start: 2023-07-03 — End: 2023-07-06
  Administered 2023-07-03: 100 ug via SUBCUTANEOUS
  Filled 2023-07-03: qty 0.5

## 2023-07-03 MED ORDER — FENTANYL CITRATE PF 50 MCG/ML IJ SOSY: 25.0000 ug | PREFILLED_SYRINGE | INTRAMUSCULAR | Status: DC | PRN

## 2023-07-03 MED ORDER — SODIUM CHLORIDE 0.9 % IV SOLN: INTRAVENOUS | Status: AC

## 2023-07-03 MED ORDER — FENTANYL CITRATE PF 50 MCG/ML IJ SOSY
50.0000 ug | PREFILLED_SYRINGE | INTRAMUSCULAR | Status: DC | PRN
  Administered 2023-07-10 – 2023-07-11 (×4): 50 ug via INTRAVENOUS
  Filled 2023-07-03 (×4): qty 1

## 2023-07-03 MED ORDER — HEPARIN SODIUM (PORCINE) 1000 UNIT/ML DIALYSIS
20.0000 [IU]/kg | INTRAMUSCULAR | Status: DC | PRN
  Administered 2023-07-03: 1400 [IU] via INTRAVENOUS_CENTRAL
  Filled 2023-07-03: qty 2

## 2023-07-03 MED ORDER — VANCOMYCIN HCL 1000 MG IV SOLR
750.0000 mg | INTRAVENOUS | Status: DC
  Filled 2023-07-03: qty 15

## 2023-07-03 MED ORDER — CHLORHEXIDINE GLUCONATE CLOTH 2 % EX PADS
6.0000 | MEDICATED_PAD | Freq: Every day | CUTANEOUS | Status: DC
  Administered 2023-07-04 – 2023-07-14 (×11): 6 via TOPICAL

## 2023-07-03 MED ORDER — MUPIROCIN 2 % EX OINT
TOPICAL_OINTMENT | Freq: Two times a day (BID) | CUTANEOUS | Status: DC
  Administered 2023-07-05 – 2023-07-13 (×3): 1 via NASAL
  Filled 2023-07-03 (×2): qty 22

## 2023-07-03 NOTE — Progress Notes (Signed)
Received patient in bed to unit.  Alert and oriented.  Informed consent signed and in chart.   TX duration: 3hours  Patient tolerated well.  Transported back to the room  Alert, without acute distress.  Hand-off given to patient's nurse.   Access used: Left Fistula Upper Arm Access issues: BFR to 350 due to high arterial pressure  Total UF removed: 2L Medication(s) given: Vancomycin   07/03/23 1800  Vitals  Temp 97.8 F (36.6 C)  Temp Source Oral  BP (!) 141/70  MAP (mmHg) 91  Pulse Rate 74  ECG Heart Rate 75  Resp 14  Oxygen Therapy  SpO2 96 %  O2 Device Room Air  During Treatment Monitoring  Dialysate Calcium Concentration 2.5  Duration of HD Treatment -hour(s) 3 hour(s)  HD Safety Checks Performed Yes  Intra-Hemodialysis Comments Tx completed;Tolerated well  Dialysis Fluid Bolus Normal Saline  Bolus Amount (mL) 300 mL  Post Treatment  Dialyzer Clearance Lightly streaked  Liters Processed 63  Fluid Removed (mL) 2 mL  Tolerated HD Treatment Yes  AVG/AVF Arterial Site Held (minutes) 7 minutes  AVG/AVF Venous Site Held (minutes) 7 minutes  Fistula / Graft Left Forearm Arteriovenous fistula  Placement Date/Time: 12/21/21 1112   Placed prior to admission: No  Orientation: Left  Access Location: Forearm  Access Type: (c) Arteriovenous fistula  Status Deaccessed     Stacie Glaze LPN [facility]

## 2023-07-03 NOTE — Plan of Care (Signed)
  Problem: Education: Goal: Knowledge of disease or condition will improve Outcome: Not Progressing   Problem: Ischemic Stroke/TIA Tissue Perfusion: Goal: Complications of ischemic stroke/TIA will be minimized Outcome: Not Progressing   Problem: Coping: Goal: Will verbalize positive feelings about self Outcome: Not Progressing   Problem: Health Behavior/Discharge Planning: Goal: Ability to manage health-related needs will improve Outcome: Not Progressing   Problem: Health Behavior/Discharge Planning: Goal: Goals will be collaboratively established with patient/family Outcome: Not Progressing   Problem: Self-Care: Goal: Ability to communicate needs accurately will improve Outcome: Not Progressing   Problem: Nutrition: Goal: Risk of aspiration will decrease Outcome: Not Progressing   Problem: Nutrition: Goal: Risk of aspiration will decrease Outcome: Not Progressing   Problem: Education: Goal: Ability to describe self-care measures that may prevent or decrease complications (Diabetes Survival Skills Education) will improve Outcome: Not Progressing

## 2023-07-03 NOTE — Progress Notes (Signed)
PROGRESS NOTE    [name]  RUE:[id number] DOB: 1956-02-02 DOA: 06/27/2023 PCP: Babs Sciara, MD   Brief Narrative:  [name] is a 68 y.o. male with medical history significant of CAD s/p CABG, combined CHF, ESRD MWF, type 2 diabetes, hypertension, SVT, OSA, upper GI bleed who presented with slurred speech and AMS.   Pt moved into SNF about 9 months ago after a hospitalization. He was living along prior to that. He fractured his right hip last April and had declined and is now mostly bedbound and wheelchair bound. Caretaker last saw him well about 6 days ago on 1/9. He was diagnosed with right sided pneumonia on 1/7. Last dialysis session was Thursday 1/9. Normally schedule is MWF but he reportedly refused it a day prior which is unlike him.   Upon arrival, he was hemodynamically stable.  He presented as a code stroke and was reported to be disoriented to month, not following commands, bilateral arm weakness, bilateral leg weakness, aphasia, dysarthria on exam.  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  Severe stenosis in the proximal right super clinoid ICA and moderate stenosis in the left supraclinoid ICA.  60% stenosis in the proximal right ICA severe stenosis at the origin of the right vertebral artery with additional severe stenosis in the right V2 segment. Chest x-ray demonstrated small right pleural effusion with patchy opacity.  There is also streaky left basilar opacity. CBC without leukocytosis, anemia stable with hemoglobin of 10.7.  BMP with creatinine of 5.66 with his previous ranging around 3-5.  Further details below.  Assessment & Plan:   Principal Problem:   Stroke [facility]) Active Problems:   Chronic combined systolic and diastolic CHF (congestive heart failure) (HCC)   HTN (hypertension)   Pleural effusion   Type 2 diabetes mellitus (HCC)   Obstructive  sleep apnea   S/P CABG (coronary artery bypass graft)   ESRD (end stage renal disease) on dialysis (HCC)   Abnormal TSH   History of supraventricular tachycardia   Acute ischemic stroke (HCC)   Bacteremia   Pressure injury of skin  Bilateral acute ischemic stroke (HCC) Pt presented with aphasia, generalized weakness from SNF -CTA head and neck demonstrated numerous areas of stenosis including 60% stenosis in the proximal right ICA. MRI brain shows Multiple scattered foci of acute infarct in the bilateral cerebral Hemispheres.  Patient started on aspirin and Plavix and neuro recommends to continue DAPT for 3 weeks followed by aspirin alone.  Due to patient being n.p.o and failing swallow he was on aspirin suppository but passed swallow on 07/01/2023 so all oral medications were given to him. Neurology following. 2D Echo ejection fraction 45 to 50%.  Left atrium moderately dilated severe mitral annulus calcification LDL 24 HgbA1c 8.1 VTE prophylaxis -subcutaneous heparin  Dysphagia: Passed bedside swallow today, details as above.  Possible aspiration pneumonia/right pleural effusion which is trivial/possible UTI?: Chest x-ray shows right pleural effusion and atelectasis and patchy right basilar opacity/infiltrate. No leukocytosis and he is afebrile but procalcitonin slightly elevated. Cannot rule out aspiration pneumonia clinically.  Patient on vancomycin transitioned from Rocephin to Zithromax since the next day of admission.  Gram-positive # MRSA bacteremia: Patient was found to have MRSA bacteremia upon admission from cultures drawn on 06/27/2023.  Source remains unclear.  Repeat cultures done 07/02/2023.  ID recommended TEE.  I have consulted cardiology 07/03/2023.  Chronic systolic congestive heart failure: Latest echo done in September 2024 shows 35 to 40% ejection fraction.  Patient appears dry.     HTN (hypertension) Blood pressure improving, patient has been refusing medications at  times, nursing has been advised to try again if he does so.  Continue Imdur, Toprol-XL.     Type 2 diabetes mellitus (HCC) - Uncontrolled.  Hemoglobin A1c of 8.1 in March and 6.4 this admission.  Continue SSI.   Obstructive sleep apnea -not on CPAP but has nighttime O2 supplement   History of supraventricular tachycardia - Continue amiodarone and beta-blocker however patient is NPO.   Abnormal TSH -TSH elevated to 11 but was just normal in September.  On Synthroid 37.5 mcg, increased to 50 mcg.   ESRD (end stage renal disease) on dialysis [facility]) -scheduled MWF. Last session on Thursday prior to admission and then on Wednesday.  Appreciate nephrology help.     S/P CABG (coronary artery bypass graft) -continue aspirin  History of SVT: On amiodarone PTA, was on hold due to being n.p.o. but no reports of SVT during this hospitalization.  Resumed amiodarone.   Goal of care: Patient was already at the nursing home and mostly bedbound due to previous strokes and multiple comorbidities, now has incomprehensible speech at times and unable to move extremities at all and failing swallow evaluation.  Carries very poor prognosis.  Palliative care consulted.  Family discussions took place.  Since he has improved after dialysis, family wants to continue aggressive care.  Sister also wants to proceed with TEE.  DVT prophylaxis: heparin injection 5,000 Units Start: 06/27/23 2200   Code Status: Limited: Do not attempt resuscitation (DNR) -DNR-LIMITED -Do Not Intubate/DNI   Family Communication:  None present at bedside.  Discussed with the sister Amy Setters over the phone 07/02/2023.  Status is: Inpatient Remains inpatient appropriate because: Will need TEE.     Estimated body mass index is 22.84 kg/m as calculated from the following:   Height as of this encounter: 5\' 10"  (1.778 m).   Weight as of this encounter: 72.2 kg.  Pressure Injury 06/28/23 Heel Left;Medial Stage 1 -  Intact skin with  non-blanchable redness of a localized area usually over a bony prominence. (Active)  06/28/23 1530  Location: Heel  Location Orientation: Left;Medial  Staging: Stage 1 -  Intact skin with non-blanchable redness of a localized area usually over a bony prominence.  Wound Description (Comments):   Present on Admission: Yes  Dressing Type Foam - Lift dressing to assess site every shift 07/03/23 0830     Pressure Injury 06/28/23 Ankle Right;Lateral Stage 1 -  Intact skin with non-blanchable redness of a localized area usually over a bony prominence. (Active)  06/28/23 1530  Location: Ankle  Location Orientation: Right;Lateral  Staging: Stage 1 -  Intact skin with non-blanchable redness of a localized area usually over a bony prominence.  Wound Description (Comments):   Present on Admission: Yes  Dressing Type Foam - Lift dressing to assess site every shift 07/03/23 0830     Pressure Injury 06/28/23 Coccyx Medial Stage 2 -  Partial thickness loss of dermis presenting as a shallow open injury with a red, pink wound bed without slough. (Active)  06/28/23 1530  Location: Coccyx  Location Orientation: Medial  Staging: Stage 2 -  Partial thickness loss of dermis presenting as a shallow open injury with a red, pink wound bed without slough.  Wound Description (Comments):   Present on Admission: Yes  Dressing  Type Foam - Lift dressing to assess site every shift 07/03/23 0830   Nutritional Assessment: Body mass index is 22.84 kg/m.Marland Kitchen Seen by dietician.  I agree with the assessment and plan as outlined below: Nutrition Status:        . Skin Assessment: I have examined the patient's skin and I agree with the wound assessment as performed by the wound care RN as outlined below: Pressure Injury 06/28/23 Heel Left;Medial Stage 1 -  Intact skin with non-blanchable redness of a localized area usually over a bony prominence. (Active)  06/28/23 1530  Location: Heel  Location Orientation:  Left;Medial  Staging: Stage 1 -  Intact skin with non-blanchable redness of a localized area usually over a bony prominence.  Wound Description (Comments):   Present on Admission: Yes  Dressing Type Foam - Lift dressing to assess site every shift 07/03/23 0830     Pressure Injury 06/28/23 Ankle Right;Lateral Stage 1 -  Intact skin with non-blanchable redness of a localized area usually over a bony prominence. (Active)  06/28/23 1530  Location: Ankle  Location Orientation: Right;Lateral  Staging: Stage 1 -  Intact skin with non-blanchable redness of a localized area usually over a bony prominence.  Wound Description (Comments):   Present on Admission: Yes  Dressing Type Foam - Lift dressing to assess site every shift 07/03/23 0830     Pressure Injury 06/28/23 Coccyx Medial Stage 2 -  Partial thickness loss of dermis presenting as a shallow open injury with a red, pink wound bed without slough. (Active)  06/28/23 1530  Location: Coccyx  Location Orientation: Medial  Staging: Stage 2 -  Partial thickness loss of dermis presenting as a shallow open injury with a red, pink wound bed without slough.  Wound Description (Comments):   Present on Admission: Yes  Dressing Type Foam - Lift dressing to assess site every shift 07/03/23 0830    Consultants:  Neurology  Procedures:  None  Antimicrobials:  Anti-infectives (From admission, onward)    Start     Dose/Rate Route Frequency Ordered Stop   07/03/23 1200  vancomycin (VANCOREADY) IVPB 750 mg/150 mL  Status:  Discontinued       Placed in "Followed by" Linked Group   750 mg 150 mL/hr over 60 Minutes Intravenous Every M-W-F (Hemodialysis) 06/30/23 1016 06/30/23 1440   07/01/23 0345  vancomycin (VANCOREADY) IVPB 500 mg/100 mL        500 mg 100 mL/hr over 60 Minutes Intravenous  Once 07/01/23 0257 07/01/23 0414   06/30/23 1600  vancomycin (VANCOREADY) IVPB 750 mg/150 mL  Status:  Discontinued       Placed in "Followed by" Linked Group    750 mg 150 mL/hr over 60 Minutes Intravenous Every M-W-F (Hemodialysis) 06/30/23 1440 06/30/23 1507   06/30/23 1600  vancomycin (VANCOREADY) IVPB 750 mg/150 mL       Placed in "Followed by" Linked Group   750 mg 150 mL/hr over 60 Minutes Intravenous Every M-W-F (Hemodialysis) 06/30/23 1507     06/30/23 1100  Vancomycin (VANCOCIN) 1,500 mg in sodium chloride 0.9 % 500 mL IVPB       Placed in "Followed by" Linked Group   1,500 mg 250 mL/hr over 120 Minutes Intravenous  Once 06/30/23 1016 06/30/23 1510   06/28/23 0700  doxycycline (VIBRAMYCIN) 100 mg in dextrose 5 % 250 mL IVPB  Status:  Discontinued        100 mg 125 mL/hr over 120 Minutes Intravenous Every 12 hours 06/27/23 2028  06/30/23 1104   06/27/23 1700  azithromycin (ZITHROMAX) 500 mg in sodium chloride 0.9 % 250 mL IVPB  Status:  Discontinued        500 mg 250 mL/hr over 60 Minutes Intravenous Every 24 hours 06/27/23 1650 06/27/23 2028   06/27/23 1700  cefTRIAXone (ROCEPHIN) 1 g in sodium chloride 0.9 % 100 mL IVPB  Status:  Discontinued        1 g 200 mL/hr over 30 Minutes Intravenous Every 24 hours 06/27/23 1650 06/30/23 1104         Subjective: Patient seen and examined.  He is slightly more lethargic today.  He is looking and tracking but not responding to questions.  Kept his eyes open during my encounter.  Objective: Vitals:   07/02/23 1946 07/02/23 2328 07/03/23 0349 07/03/23 0826  BP: (!) 163/73 (!) 156/68 (!) 186/84 (!) 172/84  Pulse: 77 75 80 79  Resp: 14 16 16 16   Temp: 98.5 F (36.9 C) 98.1 F (36.7 C) 98.6 F (37 C) 98.3 F (36.8 C)  TempSrc: Oral Oral Oral Oral  SpO2: 93% 95% 95% 98%  Weight:      Height:        Intake/Output Summary (Last 24 hours) at 07/03/2023 1218 Last data filed at 07/03/2023 0500 Gross per 24 hour  Intake 25 ml  Output 400 ml  Net -375 ml   Filed Weights   06/28/23 1804 06/28/23 2158 06/30/23 1450  Weight: 74.8 kg 72.2 kg 72.2 kg    Examination:  General exam: Appears  calm and comfortable but slightly lethargic and dehydrated Respiratory system: Clear to auscultation. Respiratory effort normal. Cardiovascular system: S1 & S2 heard, RRR. No JVD, murmurs, rubs, gallops or clicks. No pedal edema. Gastrointestinal system: Abdomen is nondistended, soft and nontender. No organomegaly or masses felt. Normal bowel sounds heard. Central nervous system: Alert but not oriented.  Not following commands today.  Data Reviewed: I have personally reviewed following labs and imaging studies  CBC: Recent Labs  Lab 06/27/23 1521 06/27/23 1540 06/28/23 1814 06/30/23 1556  WBC  --  7.9 8.0 7.0  NEUTROABS  --  6.1  --   --   HGB 11.9* 10.7* 9.8* 10.2*  HCT 35.0* 36.3* 32.4* 33.7*  MCV  --  82.1 81.2 81.4  PLT  --  223 235 247   Basic Metabolic Panel: Recent Labs  Lab 06/27/23 1521 06/27/23 1540 06/28/23 1814 06/30/23 1556  NA 136 136 136 137  K 4.0 4.1 4.0 3.5  CL 97* 96* 97* 98  CO2  --  25 20* 23  GLUCOSE 102* 101* 89 72  BUN 38* 39* 47* 33*  CREATININE 5.80* 5.66* 6.30* 5.03*  CALCIUM  --  8.8* 8.4* 8.2*  PHOS  --   --  4.6 4.3   GFR: Estimated Creatinine Clearance: 14.8 mL/min (A) (by C-G formula based on SCr of 5.03 mg/dL (H)). Liver Function Tests: Recent Labs  Lab 06/27/23 1540 06/28/23 1814 06/30/23 1556  AST 11*  --   --   ALT 7  --   --   ALKPHOS 91  --   --   BILITOT 1.1  --   --   PROT 6.3*  --   --   ALBUMIN 2.7* 2.6* 2.5*   No results for input(s): "LIPASE", "AMYLASE" in the last 168 hours. No results for input(s): "AMMONIA" in the last 168 hours. Coagulation Profile: Recent Labs  Lab 06/27/23 1540  INR 1.1   Cardiac  Enzymes: No results for input(s): "CKTOTAL", "CKMB", "CKMBINDEX", "TROPONINI" in the last 168 hours. BNP (last 3 results) No results for input(s): "PROBNP" in the last 8760 hours. HbA1C: No results for input(s): "HGBA1C" in the last 72 hours.  CBG: Recent Labs  Lab 07/01/23 1758 07/02/23 0612  07/02/23 1320 07/02/23 2331 07/03/23 0623  GLUCAP 103* 85 108* 97 118*   Lipid Profile: No results for input(s): "CHOL", "HDL", "LDLCALC", "TRIG", "CHOLHDL", "LDLDIRECT" in the last 72 hours.  Thyroid Function Tests: No results for input(s): "TSH", "T4TOTAL", "FREET4", "T3FREE", "THYROIDAB" in the last 72 hours.  Anemia Panel: No results for input(s): "VITAMINB12", "FOLATE", "FERRITIN", "TIBC", "IRON", "RETICCTPCT" in the last 72 hours. Sepsis Labs: Recent Labs  Lab 06/27/23 1548 06/27/23 1902 06/27/23 1919  PROCALCITON  --   --  0.35  LATICACIDVEN 1.3 1.1  --     Recent Results (from the past 240 hours)  Culture, blood (Routine X 2) w Reflex to ID Panel     Status: Abnormal   Collection Time: 06/27/23  4:48 PM   Specimen: BLOOD RIGHT ARM  Result Value Ref Range Status   Specimen Description BLOOD RIGHT ARM  Final   Special Requests   Final    BOTTLES DRAWN AEROBIC AND ANAEROBIC Blood Culture results may not be optimal due to an inadequate volume of blood received in culture bottles   Culture  Setup Time   Final    GRAM POSITIVE COCCI IN CLUSTERS IN BOTH AEROBIC AND ANAEROBIC BOTTLES CRITICAL RESULT CALLED TO, READ BACK BY AND VERIFIED WITH: PHARMD K. AMEND [id number] @2101  FH    Culture (A)  Final    STAPHYLOCOCCUS HOMINIS METHICILLIN RESISTANT STAPHYLOCOCCUS AUREUS THE SIGNIFICANCE OF ISOLATING THIS ORGANISM FROM A SINGLE SET OF BLOOD CULTURES WHEN MULTIPLE SETS ARE DRAWN IS UNCERTAIN. PLEASE NOTIFY THE MICROBIOLOGY DEPARTMENT WITHIN ONE WEEK IF SPECIATION AND SENSITIVITIES ARE REQUIRED. FOR STAPHYLOCOCCUS HOMINIS CRITICAL RESULT CALLED TO, READ BACK BY AND VERIFIED WITH: PHARMD B AGEE [id number] AT 1010 BY CM Performed at [facility], [address], [place], [place] [address]    Report Status 07/01/2023 FINAL  Final   Organism ID, Bacteria METHICILLIN RESISTANT STAPHYLOCOCCUS AUREUS  Final      Susceptibility   Methicillin resistant staphylococcus aureus - MIC*     CIPROFLOXACIN >=8 RESISTANT Resistant     ERYTHROMYCIN >=8 RESISTANT Resistant     GENTAMICIN <=0.5 SENSITIVE Sensitive     OXACILLIN >=4 RESISTANT Resistant     TETRACYCLINE <=1 SENSITIVE Sensitive     VANCOMYCIN 1 SENSITIVE Sensitive     TRIMETH/SULFA <=10 SENSITIVE Sensitive     CLINDAMYCIN >=8 RESISTANT Resistant     RIFAMPIN <=0.5 SENSITIVE Sensitive     Inducible Clindamycin NEGATIVE Sensitive     LINEZOLID 2 SENSITIVE Sensitive     * METHICILLIN RESISTANT STAPHYLOCOCCUS AUREUS  Blood Culture ID Panel (Reflexed)     Status: Abnormal   Collection Time: 06/27/23  4:48 PM  Result Value Ref Range Status   Enterococcus faecalis NOT DETECTED NOT DETECTED Final   Enterococcus Faecium NOT DETECTED NOT DETECTED Final   Listeria monocytogenes NOT DETECTED NOT DETECTED Final   Staphylococcus species DETECTED (A) NOT DETECTED Final    Comment: CRITICAL RESULT CALLED TO, READ BACK BY AND VERIFIED WITH: PHARMD K. AMEND [id number] @2101  FH    Staphylococcus aureus (BCID) NOT DETECTED NOT DETECTED Final   Staphylococcus epidermidis NOT DETECTED NOT DETECTED Final   Staphylococcus lugdunensis NOT DETECTED NOT DETECTED Final  Streptococcus species NOT DETECTED NOT DETECTED Final   Streptococcus agalactiae NOT DETECTED NOT DETECTED Final   Streptococcus pneumoniae NOT DETECTED NOT DETECTED Final   Streptococcus pyogenes NOT DETECTED NOT DETECTED Final   A.calcoaceticus-baumannii NOT DETECTED NOT DETECTED Final   Bacteroides fragilis NOT DETECTED NOT DETECTED Final   Enterobacterales NOT DETECTED NOT DETECTED Final   Enterobacter cloacae complex NOT DETECTED NOT DETECTED Final   Escherichia coli NOT DETECTED NOT DETECTED Final   Klebsiella aerogenes NOT DETECTED NOT DETECTED Final   Klebsiella oxytoca NOT DETECTED NOT DETECTED Final   Klebsiella pneumoniae NOT DETECTED NOT DETECTED Final   Proteus species NOT DETECTED NOT DETECTED Final   Salmonella species NOT DETECTED NOT DETECTED Final    Serratia marcescens NOT DETECTED NOT DETECTED Final   Haemophilus influenzae NOT DETECTED NOT DETECTED Final   Neisseria meningitidis NOT DETECTED NOT DETECTED Final   Pseudomonas aeruginosa NOT DETECTED NOT DETECTED Final   Stenotrophomonas maltophilia NOT DETECTED NOT DETECTED Final   Candida albicans NOT DETECTED NOT DETECTED Final   Candida auris NOT DETECTED NOT DETECTED Final   Candida glabrata NOT DETECTED NOT DETECTED Final   Candida krusei NOT DETECTED NOT DETECTED Final   Candida parapsilosis NOT DETECTED NOT DETECTED Final   Candida tropicalis NOT DETECTED NOT DETECTED Final   Cryptococcus neoformans/gattii NOT DETECTED NOT DETECTED Final    Comment: Performed at [facility], [address] [place], [place] [address]  Culture, blood (Routine X 2) w Reflex to ID Panel     Status: None   Collection Time: 06/27/23  6:57 PM   Specimen: BLOOD RIGHT HAND  Result Value Ref Range Status   Specimen Description BLOOD RIGHT HAND  Final   Special Requests   Final    BOTTLES DRAWN AEROBIC ONLY Blood Culture results may not be optimal due to an inadequate volume of blood received in culture bottles   Culture   Final    NO GROWTH 5 DAYS Performed at [facility], [address] [place], [place] [address]    Report Status 07/02/2023 FINAL  Final  Resp panel by RT-PCR (RSV, Flu A&B, Covid) Anterior Nasal Swab     Status: None   Collection Time: 06/27/23  8:00 PM   Specimen: Anterior Nasal Swab  Result Value Ref Range Status   SARS Coronavirus 2 by RT PCR NEGATIVE NEGATIVE Final   Influenza A by PCR NEGATIVE NEGATIVE Final   Influenza B by PCR NEGATIVE NEGATIVE Final    Comment: (NOTE) The Xpert Xpress SARS-CoV-2/FLU/RSV plus assay is intended as an aid in the diagnosis of influenza from Nasopharyngeal swab specimens and should not be used as a sole basis for treatment. Nasal washings and aspirates are unacceptable for Xpert Xpress SARS-CoV-2/FLU/RSV testing.  Fact  Sheet for Patients: BloggerCourse.com  Fact Sheet for Healthcare Providers: SeriousBroker.it  This test is not yet approved or cleared by the [place] FDA and has been authorized for detection and/or diagnosis of SARS-CoV-2 by FDA under an Emergency Use Authorization (EUA). This EUA will remain in effect (meaning this test can be used) for the duration of the COVID-19 declaration under Section 564(b)(1) of the Act, 21 U.S.C. section 360bbb-3(b)(1), unless the authorization is terminated or revoked.     Resp Syncytial Virus by PCR NEGATIVE NEGATIVE Final    Comment: (NOTE) Fact Sheet for Patients: BloggerCourse.com  Fact Sheet for Healthcare Providers: SeriousBroker.it  This test is not yet approved or cleared by the [place]  FDA and has been authorized for detection and/or diagnosis of SARS-CoV-2 by FDA under an Emergency Use Authorization (EUA). This EUA will remain in effect (meaning this test can be used) for the duration of the COVID-19 declaration under Section 564(b)(1) of the Act, 21 U.S.C. section 360bbb-3(b)(1), unless the authorization is terminated or revoked.  Performed at [facility], [address], [place], [place] [address]   Urine Culture     Status: None   Collection Time: 06/28/23  2:14 AM   Specimen: Urine, Random  Result Value Ref Range Status   Specimen Description URINE, RANDOM  Final   Special Requests NONE Reflexed from [id number]  Final   Culture   Final    NO GROWTH Performed at [facility], [address], [place], [place] [address]    Report Status 06/29/2023 FINAL  Final  MRSA Next Gen by PCR, Nasal     Status: Abnormal   Collection Time: 06/29/23 10:25 PM   Specimen: Nasal Mucosa; Nasal Swab  Result Value Ref Range Status   MRSA by PCR Next Gen DETECTED (A) NOT DETECTED Final    Comment: RESULT CALLED TO, READ BACK BY  AND VERIFIED WITH: Q WINSTON,RN@0044  06/30/23 MK (NOTE) The GeneXpert MRSA Assay (FDA approved for NASAL specimens only), is one component of a comprehensive MRSA colonization surveillance program. It is not intended to diagnose MRSA infection nor to guide or monitor treatment for MRSA infections. Test performance is not FDA approved in patients less than 41 years old. Performed at [facility], [address], [place], [place] [address]   Culture, blood (Routine X 2) w Reflex to ID Panel     Status: None (Preliminary result)   Collection Time: 07/02/23 10:39 AM   Specimen: BLOOD LEFT HAND  Result Value Ref Range Status   Specimen Description BLOOD LEFT HAND  Final   Special Requests   Final    BOTTLES DRAWN AEROBIC AND ANAEROBIC Blood Culture adequate volume   Culture   Final    NO GROWTH < 24 HOURS Performed at [facility], [address] [place], [place] [address]    Report Status PENDING  Incomplete  Culture, blood (Routine X 2) w Reflex to ID Panel     Status: None (Preliminary result)   Collection Time: 07/02/23 10:46 AM   Specimen: BLOOD LEFT HAND  Result Value Ref Range Status   Specimen Description BLOOD LEFT HAND  Final   Special Requests   Final    BOTTLES DRAWN AEROBIC AND ANAEROBIC Blood Culture adequate volume   Culture   Final    NO GROWTH < 24 HOURS Performed at [facility], [address] [place], [place] [address]    Report Status PENDING  Incomplete     Radiology Studies: No results found.   Scheduled Meds:   stroke: early stages of recovery book   Does not apply Once   amiodarone  200 mg Oral BID   aspirin EC  81 mg Oral Daily   calcium acetate  667 mg Oral 3 times per day on Sunday Tuesday Thursday Saturday   And   calcium acetate  667 mg Oral 2 times per day on Monday Wednesday Friday   [START ON 07/04/2023] Chlorhexidine Gluconate Cloth  6 each Topical Q0600   clopidogrel  75 mg Oral Daily   darbepoetin (ARANESP) injection  - DIALYSIS  100 mcg Subcutaneous Q Mon-1800   feeding supplement  237  mL Oral BID BM   heparin  5,000 Units Subcutaneous Q8H   isosorbide mononitrate  30 mg Oral Daily   levothyroxine  50 mcg Oral Daily   metoprolol succinate  50 mg Oral Daily   mupirocin ointment   Nasal BID   mouth rinse  15 mL Mouth Rinse 4 times per day   pantoprazole  40 mg Oral BID   sertraline  100 mg Oral Daily   sodium chloride flush  3 mL Intravenous Once   Continuous Infusions:  vancomycin Stopped (06/30/23 1825)     LOS: 5 days   Hughie Closs, MD [facility]  07/03/2023, 12:18 PM   *Please note that this is a verbal dictation therefore any spelling or grammatical errors are due to the "Dragon Medical One" system interpretation.  Please page via Amion and do not message via secure chat for urgent patient care matters. Secure chat can be used for non urgent patient care matters.  How to contact the [facility] Attending or Consulting provider 7A - 7P or covering provider during after hours 7P -7A, for this patient?  Check the care team in [facility] and look for a) attending/consulting TRH provider listed and b) the [facility] team listed. Page or secure chat 7A-7P. Log into [email or web address] and use [place]'s universal password to access. If you do not have the password, please contact the hospital operator. Locate the [facility] provider you are looking for under [facility] and page to a number that you can be directly reached. If you still have difficulty reaching the provider, please page the [facility] (Director on Call) for the Hospitalists listed on amion for assistance.

## 2023-07-03 NOTE — Progress Notes (Signed)
Pharmacy Antibiotic Note  DRUE HUIZINGA is a 68 y.o. male admitted on 06/27/2023 with  altered mental status with CVA, now found to have Staphylococcus aureus bacteremia .  Pharmacy has been consulted for vancomycin dosing.  [employer] initially detected S. Hominis species via aerobic bottle, but now anaerobic bottle (which appropriately did not have BCID performed due to similar morphology), now growing Staphyloccocus aureus. Anticipate susceptibilities tomorrow. 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 Patient to resume home MWF dialysis per Nephrology.   Plan for TEE. Plan for HD today. We will check a random level tomorrow. Repeat cultures are neg so far.   Plan: Cont vancomycin 750 mg IV every HD MWF Monitor level at steady state, with random level goal of 15-25  Height: 5\' 10"  (177.8 cm) Weight: 72.2 kg (159 lb 2.8 oz) (Unable to obatin due to specialty bed) IBW/kg (Calculated) : 73  Temp (24hrs), Avg:98.4 F (36.9 C), Min:98.1 F (36.7 C), Max:98.6 F (37 C)  Recent Labs  Lab 06/27/23 1521 06/27/23 1540 06/27/23 1548 06/27/23 1902 06/28/23 1814 06/30/23 1556 06/30/23 2249  WBC  --  7.9  --   --  8.0 7.0  --   CREATININE 5.80* 5.66*  --   --  6.30* 5.03*  --   LATICACIDVEN  --   --  1.3 1.1  --   --   --   VANCORANDOM  --   --   --   --   --   --  11    Estimated Creatinine Clearance: 14.8 mL/min (A) (by C-G formula based on SCr of 5.03 mg/dL (H)).    Allergies  Allergen Reactions   Bee Venom Anaphylaxis   Atorvastatin Other (See Comments)    myalgia   Diltiazem Itching   Lopressor [Metoprolol]     NIGHTMARES. Pt tolerating this now 11/25/22   Oxycodone Other (See Comments)    Reaction not listed on MAR    Reglan [Metoclopramide] Other (See Comments)    Suicidal    Rosuvastatin Other (See Comments)    myalgias   Valsartan Itching   Zyrtec [Cetirizine] Other (See Comments)    intolerance    Antimicrobials this admission: Ceftriaxone 1/14 >>  1/17 Doxycycline 1/14 >> 1/17 Vancomycin 1/17 >>   Dose adjustments this admission: None  Microbiology results: 1/14 BCx: 1/4 S. Hominis, 1/4 MRSA 1/16 MRSA PCR: detected 1/19 Bcx: ngtd  Ulyses Southward, PharmD, BCIDP, AAHIVP, CPP Infectious Disease Pharmacist 07/03/2023 10:51 AM

## 2023-07-03 NOTE — Progress Notes (Signed)
[facility] Progress Note   Subjective: Seen in room - up in bed. On 2L [place].  Awake but doesn't respond to questions, not following commands.  For dialysis today   Objective Vitals:   07/02/23 1946 07/02/23 2328 07/03/23 0349 07/03/23 0826  BP: (!) 163/73 (!) 156/68 (!) 186/84 (!) (P) 172/84  Pulse: 77 75 80 (P) 79  Resp: 14 16 16  (P) 16  Temp: 98.5 F (36.9 C) 98.1 F (36.7 C) 98.6 F (37 C) (P) 98.3 F (36.8 C)  TempSrc: Oral Oral Oral (P) Oral  SpO2: 93% 95% 95% (P) 98%  Weight:      Height:         Additional Objective Labs: Basic Metabolic Panel: Recent Labs  Lab 06/27/23 1540 06/28/23 1814 06/30/23 1556  NA 136 136 137  K 4.1 4.0 3.5  CL 96* 97* 98  CO2 25 20* 23  GLUCOSE 101* 89 72  BUN 39* 47* 33*  CREATININE 5.66* 6.30* 5.03*  CALCIUM 8.8* 8.4* 8.2*  PHOS  --  4.6 4.3   CBC: Recent Labs  Lab 06/27/23 1540 06/28/23 1814 06/30/23 1556  WBC 7.9 8.0 7.0  NEUTROABS 6.1  --   --   HGB 10.7* 9.8* 10.2*  HCT 36.3* 32.4* 33.7*  MCV 82.1 81.2 81.4  PLT 223 235 247   Blood Culture    Component Value Date/Time   SDES URINE, RANDOM 06/28/2023 0214   SPECREQUEST NONE Reflexed from [id number] 06/28/2023 0214   CULT  06/28/2023 0214    NO GROWTH Performed at [facility], [address] [place], [place] [address]    REPTSTATUS 06/29/2023 FINAL 06/28/2023 0214     Physical Exam General: Eyes open, not tracking  Heart: RRR Lungs: Clear  Abdomen: soft -non-tender Extremities: No LE edema  Dialysis Access: L AVF +bruit   Medications:  vancomycin Stopped (06/30/23 1825)     stroke: early stages of recovery book   Does not apply Once   amiodarone  200 mg Oral BID   aspirin EC  81 mg Oral Daily   calcium acetate  667 mg Oral 3 times per day on Sunday Tuesday Thursday Saturday   And   calcium acetate  667 mg Oral 2 times per day on Monday Wednesday Friday   clopidogrel  75 mg Oral Daily   feeding supplement  237 mL Oral BID BM    heparin  5,000 Units Subcutaneous Q8H   isosorbide mononitrate  30 mg Oral Daily   levothyroxine  50 mcg Oral Daily   metoprolol succinate  50 mg Oral Daily   mouth rinse  15 mL Mouth Rinse 4 times per day   pantoprazole  40 mg Oral BID   sertraline  100 mg Oral Daily   sodium chloride flush  3 mL Intravenous Once    Dialysis Orders:  [facility] MWF 4:00 400/600 EDW 75.3kg 2K/2.5Ca AVF Hep 5000 Last HD 1/10.  Mircera 200 q 2wks (last 06/16/23) Hectorol 1mg  TIW   Assessment/Plan: Acute CVA - multiple scattered infarcts on MRI. W/u in progress. Per neurology  Acute encephalopathy with aphasia- likely multifactorial, not improved Bacteremia - MRSA bacteremia on BCx 1/14. Repeat cultures pending. On Vanc. ID following.   ESRD -  HD MWF. Cont on schedule using AVF pending goals of care decisions HD today   Hypertension/volume  - Permissive HTN per neuro. No gross volume overload   Anemia  - Hgb 9.8. On ESA as outpatient -- ordered  for 1/20.   Metabolic bone disease -  Calcium and P acceptable. Continue home meds when tolerating po  H/o SVT - on amiodarone  T2DM - per primary  GOC - Palliative care consulted - DNR/DNI   Tomasa Blase PA-C [facility] 07/03/2023,9:54 AM

## 2023-07-03 NOTE — Progress Notes (Signed)
   [facility] has been requested to perform a transesophageal echocardiogram on [name] for bacteremia.  After careful review of history and examination, the risks and benefits of transesophageal echocardiogram have been explained including risks of esophageal damage, perforation (1:10,000 risk), bleeding, pharyngeal hematoma as well as other potential complications associated with anesthesia including aspiration, arrhythmia, respiratory failure and death. Alternatives to treatment were discussed, questions were answered. Sister and caregiver is willing to consent for the patient to proceed.   Patient with PMH of CAD s/p CABG, combined CHF, ESRD on HD MWF, HTN, HLD, DM II, SVT, OSA and upper GI bleed who has deteriorated over the past 9 month presented with slurred speech and AMS. CT showed evidence of stroke. Blood culture grew MRSA. Seen by ID who recommended TEE. BP hypertensive today. Hgb 10.2, platelet normal. Patient unable to give consent due to AMS. Spoke with friend/caregiver in person and on the phone with sister who is a ER physician. Both agreed to give consent for TEE. Will need anesthesia support.   [place], [place] 07/03/2023 1:27 PM

## 2023-07-03 NOTE — Progress Notes (Signed)
[facility]   Reason for visit: Follow up on bacteremia  Interval History: He remains afebrile.  He is refusing oral intake.  Does not respond to questions.    Physical Exam: Constitutional:  Vitals:   07/03/23 0826 07/03/23 1304  BP: (!) 172/84 (!) 160/73  Pulse: 79 74  Resp: 16 16  Temp: 98.3 F (36.8 C) 98.4 F (36.9 C)  SpO2: 98% 93%   patient appears in NAD Eyes: anicteric HENT: no thrush Respiratory: Normal respiratory effort  Review of Systems: Unable to be assessed due to mental status  Lab Results  Component Value Date   WBC 7.0 06/30/2023   HGB 10.2 (L) 06/30/2023   HCT 33.7 (L) 06/30/2023   MCV 81.4 06/30/2023   PLT 247 06/30/2023    Lab Results  Component Value Date   CREATININE 5.03 (H) 06/30/2023   BUN 33 (H) 06/30/2023   NA 137 06/30/2023   K 3.5 06/30/2023   CL 98 06/30/2023   CO2 23 06/30/2023    Lab Results  Component Value Date   ALT 7 06/27/2023   AST 11 (L) 06/27/2023   ALKPHOS 91 06/27/2023     Microbiology: Recent Results (from the past 240 hours)  Culture, blood (Routine X 2) w Reflex to ID Panel     Status: Abnormal   Collection Time: 06/27/23  4:48 PM   Specimen: BLOOD RIGHT ARM  Result Value Ref Range Status   Specimen Description BLOOD RIGHT ARM  Final   Special Requests   Final    BOTTLES DRAWN AEROBIC AND ANAEROBIC Blood Culture results may not be optimal due to an inadequate volume of blood received in culture bottles   Culture  Setup Time   Final    GRAM POSITIVE COCCI IN CLUSTERS IN BOTH AEROBIC AND ANAEROBIC BOTTLES CRITICAL RESULT CALLED TO, READ BACK BY AND VERIFIED WITH: PHARMD K. AMEND [id number] @2101  FH    Culture (A)  Final    STAPHYLOCOCCUS HOMINIS METHICILLIN RESISTANT STAPHYLOCOCCUS AUREUS THE SIGNIFICANCE OF ISOLATING THIS ORGANISM FROM A SINGLE SET OF BLOOD CULTURES WHEN MULTIPLE SETS ARE DRAWN IS UNCERTAIN. PLEASE NOTIFY THE MICROBIOLOGY DEPARTMENT WITHIN ONE WEEK IF  SPECIATION AND SENSITIVITIES ARE REQUIRED. FOR STAPHYLOCOCCUS HOMINIS CRITICAL RESULT CALLED TO, READ BACK BY AND VERIFIED WITH: PHARMD B AGEE [id number] AT 1010 BY CM Performed at [facility], [address] [place], [place] [address]    Report Status 07/01/2023 FINAL  Final   Organism ID, Bacteria METHICILLIN RESISTANT STAPHYLOCOCCUS AUREUS  Final      Susceptibility   Methicillin resistant staphylococcus aureus - MIC*    CIPROFLOXACIN >=8 RESISTANT Resistant     ERYTHROMYCIN >=8 RESISTANT Resistant     GENTAMICIN <=0.5 SENSITIVE Sensitive     OXACILLIN >=4 RESISTANT Resistant     TETRACYCLINE <=1 SENSITIVE Sensitive     VANCOMYCIN 1 SENSITIVE Sensitive     TRIMETH/SULFA <=10 SENSITIVE Sensitive     CLINDAMYCIN >=8 RESISTANT Resistant     RIFAMPIN <=0.5 SENSITIVE Sensitive     Inducible Clindamycin NEGATIVE Sensitive     LINEZOLID 2 SENSITIVE Sensitive     * METHICILLIN RESISTANT STAPHYLOCOCCUS AUREUS  Blood Culture ID Panel (Reflexed)     Status: Abnormal   Collection Time: 06/27/23  4:48 PM  Result Value Ref Range Status   Enterococcus faecalis NOT DETECTED NOT DETECTED Final   Enterococcus Faecium NOT DETECTED NOT DETECTED Final   Listeria monocytogenes NOT DETECTED NOT DETECTED  Final   Staphylococcus species DETECTED (A) NOT DETECTED Final    Comment: CRITICAL RESULT CALLED TO, READ BACK BY AND VERIFIED WITH: PHARMD K. AMEND [phone] @2101  FH    Staphylococcus aureus (BCID) NOT DETECTED NOT DETECTED Final   Staphylococcus epidermidis NOT DETECTED NOT DETECTED Final   Staphylococcus lugdunensis NOT DETECTED NOT DETECTED Final   Streptococcus species NOT DETECTED NOT DETECTED Final   Streptococcus agalactiae NOT DETECTED NOT DETECTED Final   Streptococcus pneumoniae NOT DETECTED NOT DETECTED Final   Streptococcus pyogenes NOT DETECTED NOT DETECTED Final   A.calcoaceticus-baumannii NOT DETECTED NOT DETECTED Final   Bacteroides fragilis NOT DETECTED NOT DETECTED Final    Enterobacterales NOT DETECTED NOT DETECTED Final   Enterobacter cloacae complex NOT DETECTED NOT DETECTED Final   Escherichia coli NOT DETECTED NOT DETECTED Final   Klebsiella aerogenes NOT DETECTED NOT DETECTED Final   Klebsiella oxytoca NOT DETECTED NOT DETECTED Final   Klebsiella pneumoniae NOT DETECTED NOT DETECTED Final   Proteus species NOT DETECTED NOT DETECTED Final   Salmonella species NOT DETECTED NOT DETECTED Final   Serratia marcescens NOT DETECTED NOT DETECTED Final   Haemophilus influenzae NOT DETECTED NOT DETECTED Final   Neisseria meningitidis NOT DETECTED NOT DETECTED Final   Pseudomonas aeruginosa NOT DETECTED NOT DETECTED Final   Stenotrophomonas maltophilia NOT DETECTED NOT DETECTED Final   Candida albicans NOT DETECTED NOT DETECTED Final   Candida auris NOT DETECTED NOT DETECTED Final   Candida glabrata NOT DETECTED NOT DETECTED Final   Candida krusei NOT DETECTED NOT DETECTED Final   Candida parapsilosis NOT DETECTED NOT DETECTED Final   Candida tropicalis NOT DETECTED NOT DETECTED Final   Cryptococcus neoformans/gattii NOT DETECTED NOT DETECTED Final    Comment: Performed at [facility], [address] [place], [place] [address]  Culture, blood (Routine X 2) w Reflex to ID Panel     Status: None   Collection Time: 06/27/23  6:57 PM   Specimen: BLOOD RIGHT HAND  Result Value Ref Range Status   Specimen Description BLOOD RIGHT HAND  Final   Special Requests   Final    BOTTLES DRAWN AEROBIC ONLY Blood Culture results may not be optimal due to an inadequate volume of blood received in culture bottles   Culture   Final    NO GROWTH 5 DAYS Performed at [facility], [address], [place], [place] [address]    Report Status 07/02/2023 FINAL  Final  Resp panel by RT-PCR (RSV, Flu A&B, Covid) Anterior Nasal Swab     Status: None   Collection Time: 06/27/23  8:00 PM   Specimen: Anterior Nasal Swab  Result Value Ref Range Status   SARS Coronavirus 2 by  RT PCR NEGATIVE NEGATIVE Final   Influenza A by PCR NEGATIVE NEGATIVE Final   Influenza B by PCR NEGATIVE NEGATIVE Final    Comment: (NOTE) The Xpert Xpress SARS-CoV-2/FLU/RSV plus assay is intended as an aid in the diagnosis of influenza from Nasopharyngeal swab specimens and should not be used as a sole basis for treatment. Nasal washings and aspirates are unacceptable for Xpert Xpress SARS-CoV-2/FLU/RSV testing.  Fact Sheet for Patients: BloggerCourse.com  Fact Sheet for Healthcare Providers: SeriousBroker.it  This test is not yet approved or cleared by the [place] FDA and has been authorized for detection and/or diagnosis of SARS-CoV-2 by FDA under an Emergency Use Authorization (EUA). This EUA will remain in effect (meaning this test can be used) for the duration of the  COVID-19 declaration under Section 564(b)(1) of the Act, 21 U.S.C. section 360bbb-3(b)(1), unless the authorization is terminated or revoked.     Resp Syncytial Virus by PCR NEGATIVE NEGATIVE Final    Comment: (NOTE) Fact Sheet for Patients: BloggerCourse.com  Fact Sheet for Healthcare Providers: SeriousBroker.it  This test is not yet approved or cleared by the [place] FDA and has been authorized for detection and/or diagnosis of SARS-CoV-2 by FDA under an Emergency Use Authorization (EUA). This EUA will remain in effect (meaning this test can be used) for the duration of the COVID-19 declaration under Section 564(b)(1) of the Act, 21 U.S.C. section 360bbb-3(b)(1), unless the authorization is terminated or revoked.  Performed at [facility], [address], [place], [place] [address]   Urine Culture     Status: None   Collection Time: 06/28/23  2:14 AM   Specimen: Urine, Random  Result Value Ref Range Status   Specimen Description URINE, RANDOM  Final   Special Requests NONE  Reflexed from [id number]  Final   Culture   Final    NO GROWTH Performed at [facility], [address] [place], [place] [address]    Report Status 06/29/2023 FINAL  Final  MRSA Next Gen by PCR, Nasal     Status: Abnormal   Collection Time: 06/29/23 10:25 PM   Specimen: Nasal Mucosa; Nasal Swab  Result Value Ref Range Status   MRSA by PCR Next Gen DETECTED (A) NOT DETECTED Final    Comment: RESULT CALLED TO, READ BACK BY AND VERIFIED WITH: Q WINSTON,RN@0044  06/30/23 MK (NOTE) The GeneXpert MRSA Assay (FDA approved for NASAL specimens only), is one component of a comprehensive MRSA colonization surveillance program. It is not intended to diagnose MRSA infection nor to guide or monitor treatment for MRSA infections. Test performance is not FDA approved in patients less than 42 years old. Performed at [facility], [address], [place], [place] [address]   Culture, blood (Routine X 2) w Reflex to ID Panel     Status: None (Preliminary result)   Collection Time: 07/02/23 10:39 AM   Specimen: BLOOD LEFT HAND  Result Value Ref Range Status   Specimen Description BLOOD LEFT HAND  Final   Special Requests   Final    BOTTLES DRAWN AEROBIC AND ANAEROBIC Blood Culture adequate volume   Culture   Final    NO GROWTH < 24 HOURS Performed at [facility], [address] [place], [place] [address]    Report Status PENDING  Incomplete  Culture, blood (Routine X 2) w Reflex to ID Panel     Status: None (Preliminary result)   Collection Time: 07/02/23 10:46 AM   Specimen: BLOOD LEFT HAND  Result Value Ref Range Status   Specimen Description BLOOD LEFT HAND  Final   Special Requests   Final    BOTTLES DRAWN AEROBIC AND ANAEROBIC Blood Culture adequate volume   Culture   Final    NO GROWTH < 24 HOURS Performed at [facility], [address] [place], [place] [address]    Report Status PENDING  Incomplete    Impression/Plan:  1.  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  TEE is planned and will continue with vancomycin. Repeat blood cultures are no growth to date.  2.  End-stage renal disease.  He continues to get vancomycin with dialysis and will continue with that.  3.  Goals of  care.  Overall poor prognosis.  Will continue with workup per family discussion.  Will continue to follow

## 2023-07-03 NOTE — Progress Notes (Signed)
Palliative:  HPI:  68 y.o. male  with past medical history of stroke, CAD s/p CABG, HFmrEF, ESRD on HD MWF, diabetes, HTN, SVT, OSA, upper GIB, esophageal cancer 2022, right eye vision loss, s/p hip fracture s/p repair admitted on 06/27/2023 with slurred speech and AMS due to stroke, uremia (missed HD), and new stroke.   I met today with [name] and [name] is at bedside. Unfortunately [name] is very lethargic. He seems more like he was originally when he first came to the hospital and I saw him in the ED. I reviewed labs and medications. I do see that he has had a few doses of morphine - I doubt that this is enough to explain his declined mental status. I changed to PRN fentanyl just in case. Plans for HD today. Will await and see if he has any improvement with HD, decrease in pain medication, and more time for antibiotics treatment.   I called and spoke with HCPOA/sister, [name]. We reviewed his fluctuating mental status. I worry about his refusal of medications and poor intake. I explained to [name] my assessment. We reviewed concern that his improvement over the weekend could have been a rally. We reviewed medications, antibiotics/infection, labs. We agree to continue current treatment and to monitor closely. We discussed plans for TEE tomorrow afternoon - we reviewed risks vs benefits and [name] is unsure if we should proceed if he continues in his current state. I agree to reassess him in the morning and we can discuss again our path forward. [name] agrees with plan and we will discuss again tomorrow.  All questions/concerns addressed. Emotional support.   Exam: Awakens with eyes opening but struggling to make eye contact. No distress. Follows some simple commands. Cough to command but ineffective. Breathing regular, unlabored. Abd soft. Generalized weakness.   Plan: - DNR/DNI - Time for outcomes - HCPOA may reconsider plans to pursue TEE if no improvement  50 min  Yong Channel, NP Palliative Medicine  Team Pager [phone] (Please see [email or web address] for schedule) Team Phone [phone]

## 2023-07-03 NOTE — Progress Notes (Signed)
Speech Language Pathology Treatment: Dysphagia  Patient Details Name: [name] MRN: [id number] DOB: December 07, 1955 Today's Date: 07/03/2023 Time: 1159-1209 SLP Time Calculation (min) (ACUTE ONLY): 10 min  Assessment / Plan / Recommendation Clinical Impression  [name] was very lethargic this am. Alerted to sternal rub and calling his name, but would drift back to sleep despite multiple efforts.  Accepted only limited sips of water - holding orally and required oral suction from mouth to remove.  [name] arrived during session - cautioned her to hold on feeding until he awakens.    SLP will follow - feed/give meds only when sufficiently alert.   HPI HPI: Pt is a 68 yo male presenting with slurred speech and AMS. MRI revealed multiple scattered foci of acute infarct in the bilateral cerebral hemispheres. Pt has been seen multiple times by SLP in the past with dysphagia suspected to be primarily of esophageal origin. He has been on pureed diets up to mechanical soft diets, always with thin liquids. PMH includes: recent PNA (dx 1/7), stroke (2022), esophageal ca (2022), HH, CAD s/p CABG, combined CHF, ESRD, DMII, HTN, SVT, OSA, UGIB. Pt bed and w/c bound since hip fx last April and now resides at [facility].      SLP Plan  Continue with current plan of care      Recommendations for follow up therapy are one component of a multi-disciplinary discharge planning process, led by the attending physician.  Recommendations may be updated based on patient status, additional functional criteria and insurance authorization.    Recommendations  Diet recommendations: Dysphagia 2 (fine chop);Thin liquid Liquids provided via: Cup;Straw Medication Administration: Whole meds with puree Supervision: Trained caregiver to feed patient                  Oral care BID   Frequent or constant Supervision/Assistance Dysphagia, unspecified (R13.10)     Continue with current plan of care    [name] L.  [name] Frederic, MA CCC/SLP Clinical Specialist - Acute Care SLP Acute Rehabilitation Services Office number [phone]  [name] [name]  07/03/2023, 12:12 PM

## 2023-07-04 ENCOUNTER — Other Ambulatory Visit (HOSPITAL_COMMUNITY): Payer: Medicare Other

## 2023-07-04 ENCOUNTER — Encounter (HOSPITAL_COMMUNITY)
Admission: EM | Disposition: A | Discharge status: Hospice | Payer: Self-pay | Source: Skilled Nursing Facility | Attending: Family Medicine

## 2023-07-04 ENCOUNTER — Encounter (HOSPITAL_COMMUNITY): Payer: Self-pay | Admitting: Anesthesiology

## 2023-07-04 DIAGNOSIS — Z7189 Other specified counseling: Secondary | ICD-10-CM | POA: Diagnosis not present

## 2023-07-04 DIAGNOSIS — N186 End stage renal disease: Secondary | ICD-10-CM | POA: Diagnosis not present

## 2023-07-04 DIAGNOSIS — Z515 Encounter for palliative care: Secondary | ICD-10-CM | POA: Diagnosis not present

## 2023-07-04 DIAGNOSIS — R7881 Bacteremia: Secondary | ICD-10-CM | POA: Diagnosis not present

## 2023-07-04 DIAGNOSIS — I639 Cerebral infarction, unspecified: Secondary | ICD-10-CM | POA: Diagnosis not present

## 2023-07-04 LAB — GLUCOSE, CAPILLARY
Glucose-Capillary: 107 mg/dL — ABNORMAL HIGH (ref 70–99)
Glucose-Capillary: 108 mg/dL — ABNORMAL HIGH (ref 70–99)
Glucose-Capillary: 109 mg/dL — ABNORMAL HIGH (ref 70–99)
Glucose-Capillary: 110 mg/dL — ABNORMAL HIGH (ref 70–99)

## 2023-07-04 LAB — VANCOMYCIN, RANDOM: Vancomycin Rm: 18 ug/mL

## 2023-07-04 SURGERY — TRANSESOPHAGEAL ECHOCARDIOGRAM (TEE) (CATHLAB)
Anesthesia: Monitor Anesthesia Care

## 2023-07-04 NOTE — Progress Notes (Signed)
Occupational Therapy Treatment Patient Details Name: [name] MRN: [id number] DOB: Aug 11, 1955 Today's Date: 07/04/2023   History of present illness [name] is a 68 yo male presenting with stroke like symptoms, slurred, speech, and altered mental status. MRI revealed multiple scattered foci of acute infarct in the bilateral cerebral hemispheres, no hemorrhage or mass effect. PMHx TIA,CAD, cancer, ESRD on hemodialysis, heart failure, MI, diabetes, sleep apnea, R hip ORIF 08/2022   OT comments  Pt seen for second treatment session. Pt sister present and confirmed prior level of function being bed bound with TOTAL PLOF for ADLs and hoyer lift to wheelchair. Pt presented with increased arousal and visual attention but there was no command following despite MAX multi modal cues. PROM completed at all extremities and remains consistent with evaluation, no activation noted. Pt does not have acute OT needs, will sign off acutely POC remains appropriate, discussed with sister and she verbalized understanding. Please reorder OT if pt status changes.       If plan is discharge home, recommend the following:  A lot of help with walking and/or transfers;Two people to help with walking and/or transfers;A lot of help with bathing/dressing/bathroom;Two people to help with bathing/dressing/bathroom;Assistance with cooking/housework;Assistance with feeding;Direct supervision/assist for medications management;Direct supervision/assist for financial management;Assist for transportation;Supervision due to cognitive status;Help with stairs or ramp for entrance   Equipment Recommendations  None recommended by OT    Recommendations for Other Services      Precautions / Restrictions Precautions Precautions: Fall Restrictions Weight Bearing Restrictions Per Provider Order: No       Mobility Bed Mobility Overal bed mobility: Needs Assistance             General bed mobility comments: TOTAL A  for all aspects of mobility tasks    Transfers Overall transfer level: Needs assistance                 General transfer comment: TOTAL A for transfers. Uses hoyer at SNF     Balance Overall balance assessment: Needs assistance, History of Falls                                         ADL either performed or assessed with clinical judgement   ADL Overall ADL's : Needs assistance/impaired                                       General ADL Comments: total A for all aspects of care at bed level    Extremity/Trunk Assessment Upper Extremity Assessment Upper Extremity Assessment: Right hand dominant;RUE deficits/detail RUE Deficits / Details: difficult flexion at most joints, tremor/spacticity? noted with PROM. RUE Coordination: decreased fine motor;decreased gross motor LUE Deficits / Details: difficult flexion at most joints, tremor/spacticity noted with PROM. no activation noted. LUE Coordination: decreased fine motor;decreased gross motor   Lower Extremity Assessment Lower Extremity Assessment: Defer to PT evaluation        Vision   Vision Assessment?: No apparent visual deficits   Perception Perception Perception: Not tested   Praxis Praxis Praxis: Not tested    Cognition Arousal: Alert Behavior During Therapy: Flat affect Overall Cognitive Status: Impaired/Different from baseline Area of Impairment: Following commands, Awareness (Pt did not follow one-step verbal commands)  Awareness: Intellectual   General Comments: Pt with increased alertness noted, visually tracking throughout session but did not follow one-step verbal commands with tactile cues        Exercises      Shoulder Instructions       General Comments VSS on room air; sister present and answer questions about PLOF    Pertinent Vitals/ Pain       Pain Assessment Pain Assessment: Faces Faces Pain Scale: Hurts  little more Pain Location: R wrist and RLE Pain Descriptors / Indicators: Grimacing (with PROM of R wrist and RLE) Pain Intervention(s): Limited activity within patient's tolerance  Home Living                                          Prior Functioning/Environment              Frequency  Min 1X/week        Progress Toward Goals  OT Goals(current goals can now be found in the care plan section)  Progress towards OT goals: Not progressing toward goals - comment (Pt with increased level of alertness on this date but continues to be limited by cognition (following one-step verbal commands), communication, spasticity)  Acute Rehab OT Goals Patient Stated Goal: none OT Goal Formulation: With family Time For Goal Achievement: 07/04/23 Potential to Achieve Goals: Poor  Plan      Co-evaluation          OT goals addressed during session: Strengthening/ROM      AM-PAC OT "6 Clicks" Daily Activity     Outcome Measure   Help from another person eating meals?: Total Help from another person taking care of personal grooming?: Total Help from another person toileting, which includes using toliet, bedpan, or urinal?: Total Help from another person bathing (including washing, rinsing, drying)?: Total Help from another person to put on and taking off regular upper body clothing?: Total Help from another person to put on and taking off regular lower body clothing?: Total 6 Click Score: 6    End of Session    OT Visit Diagnosis: Unsteadiness on feet (R26.81);Other abnormalities of gait and mobility (R26.89);History of falling (Z91.81);Muscle weakness (generalized) (M62.81)   Activity Tolerance Patient limited by pain;Patient limited by lethargy   Patient Left in bed;with call bell/phone within reach;with family/visitor present   Nurse Communication Mobility status        Time: 1455-1515 OT Time Calculation (min): 20 min  Charges: OT General  Charges $OT Visit: 1 Visit OT Treatments $Therapeutic Activity: 8-22 mins  Kevan Ny, Darliss Cheney 07/04/2023, 3:37 PM

## 2023-07-04 NOTE — Plan of Care (Signed)
  Problem: Nutrition: Goal: Risk of aspiration will decrease Outcome: Progressing   Problem: Metabolic: Goal: Ability to maintain appropriate glucose levels will improve Outcome: Progressing   Problem: Skin Integrity: Goal: Risk for impaired skin integrity will decrease Outcome: Progressing   Problem: Tissue Perfusion: Goal: Adequacy of tissue perfusion will improve Outcome: Progressing   Problem: Clinical Measurements: Goal: Ability to maintain clinical measurements within normal limits will improve Outcome: Progressing   Problem: Coping: Goal: Level of anxiety will decrease Outcome: Progressing   Problem: Nutritional: Goal: Maintenance of adequate nutrition will improve Outcome: Not Progressing Note: Poor PO intake.

## 2023-07-04 NOTE — Progress Notes (Signed)
[facility]   Reason for visit: Follow-up on bacteremia.  Interval History: He remains afebrile.  WBC of 7.0.  He does not respond to any questions.    Physical Exam: Constitutional:  Vitals:   07/04/23 0837 07/04/23 1100  BP: (!) 171/73 (!) 159/70  Pulse: 72 71  Resp: 14 14  Temp: 98.3 F (36.8 C) 98.6 F (37 C)  SpO2: 95% 100%  Patient appears in no acute distress Respiratory: Normal respiratory effort  Review of Systems: Unable to be assessed due to mental status  Lab Results  Component Value Date   WBC 7.0 06/30/2023   HGB 10.2 (L) 06/30/2023   HCT 33.7 (L) 06/30/2023   MCV 81.4 06/30/2023   PLT 247 06/30/2023    Lab Results  Component Value Date   CREATININE 5.03 (H) 06/30/2023   BUN 33 (H) 06/30/2023   NA 137 06/30/2023   K 3.5 06/30/2023   CL 98 06/30/2023   CO2 23 06/30/2023    Lab Results  Component Value Date   ALT 7 06/27/2023   AST 11 (L) 06/27/2023   ALKPHOS 91 06/27/2023     Microbiology: Recent Results (from the past 240 hours)  Culture, blood (Routine X 2) w Reflex to ID Panel     Status: Abnormal   Collection Time: 06/27/23  4:48 PM   Specimen: BLOOD RIGHT ARM  Result Value Ref Range Status   Specimen Description BLOOD RIGHT ARM  Final   Special Requests   Final    BOTTLES DRAWN AEROBIC AND ANAEROBIC Blood Culture results may not be optimal due to an inadequate volume of blood received in culture bottles   Culture  Setup Time   Final    GRAM POSITIVE COCCI IN CLUSTERS IN BOTH AEROBIC AND ANAEROBIC BOTTLES CRITICAL RESULT CALLED TO, READ BACK BY AND VERIFIED WITH: PHARMD K. AMEND [id number] @2101  FH    Culture (A)  Final    STAPHYLOCOCCUS HOMINIS METHICILLIN RESISTANT STAPHYLOCOCCUS AUREUS THE SIGNIFICANCE OF ISOLATING THIS ORGANISM FROM A SINGLE SET OF BLOOD CULTURES WHEN MULTIPLE SETS ARE DRAWN IS UNCERTAIN. PLEASE NOTIFY THE MICROBIOLOGY DEPARTMENT WITHIN ONE WEEK IF SPECIATION AND SENSITIVITIES ARE  REQUIRED. FOR STAPHYLOCOCCUS HOMINIS CRITICAL RESULT CALLED TO, READ BACK BY AND VERIFIED WITH: PHARMD B AGEE [id number] AT 1010 BY CM Performed at [facility], [address] [place], [place] [address]    Report Status 07/01/2023 FINAL  Final   Organism ID, Bacteria METHICILLIN RESISTANT STAPHYLOCOCCUS AUREUS  Final      Susceptibility   Methicillin resistant staphylococcus aureus - MIC*    CIPROFLOXACIN >=8 RESISTANT Resistant     ERYTHROMYCIN >=8 RESISTANT Resistant     GENTAMICIN <=0.5 SENSITIVE Sensitive     OXACILLIN >=4 RESISTANT Resistant     TETRACYCLINE <=1 SENSITIVE Sensitive     VANCOMYCIN 1 SENSITIVE Sensitive     TRIMETH/SULFA <=10 SENSITIVE Sensitive     CLINDAMYCIN >=8 RESISTANT Resistant     RIFAMPIN <=0.5 SENSITIVE Sensitive     Inducible Clindamycin NEGATIVE Sensitive     LINEZOLID 2 SENSITIVE Sensitive     * METHICILLIN RESISTANT STAPHYLOCOCCUS AUREUS  Blood Culture ID Panel (Reflexed)     Status: Abnormal   Collection Time: 06/27/23  4:48 PM  Result Value Ref Range Status   Enterococcus faecalis NOT DETECTED NOT DETECTED Final   Enterococcus Faecium NOT DETECTED NOT DETECTED Final   Listeria monocytogenes NOT DETECTED NOT DETECTED Final   Staphylococcus species  DETECTED (A) NOT DETECTED Final    Comment: CRITICAL RESULT CALLED TO, READ BACK BY AND VERIFIED WITH: PHARMD K. AMEND [phone] @2101  FH    Staphylococcus aureus (BCID) NOT DETECTED NOT DETECTED Final   Staphylococcus epidermidis NOT DETECTED NOT DETECTED Final   Staphylococcus lugdunensis NOT DETECTED NOT DETECTED Final   Streptococcus species NOT DETECTED NOT DETECTED Final   Streptococcus agalactiae NOT DETECTED NOT DETECTED Final   Streptococcus pneumoniae NOT DETECTED NOT DETECTED Final   Streptococcus pyogenes NOT DETECTED NOT DETECTED Final   A.calcoaceticus-baumannii NOT DETECTED NOT DETECTED Final   Bacteroides fragilis NOT DETECTED NOT DETECTED Final   Enterobacterales NOT DETECTED NOT  DETECTED Final   Enterobacter cloacae complex NOT DETECTED NOT DETECTED Final   Escherichia coli NOT DETECTED NOT DETECTED Final   Klebsiella aerogenes NOT DETECTED NOT DETECTED Final   Klebsiella oxytoca NOT DETECTED NOT DETECTED Final   Klebsiella pneumoniae NOT DETECTED NOT DETECTED Final   Proteus species NOT DETECTED NOT DETECTED Final   Salmonella species NOT DETECTED NOT DETECTED Final   Serratia marcescens NOT DETECTED NOT DETECTED Final   Haemophilus influenzae NOT DETECTED NOT DETECTED Final   Neisseria meningitidis NOT DETECTED NOT DETECTED Final   Pseudomonas aeruginosa NOT DETECTED NOT DETECTED Final   Stenotrophomonas maltophilia NOT DETECTED NOT DETECTED Final   Candida albicans NOT DETECTED NOT DETECTED Final   Candida auris NOT DETECTED NOT DETECTED Final   Candida glabrata NOT DETECTED NOT DETECTED Final   Candida krusei NOT DETECTED NOT DETECTED Final   Candida parapsilosis NOT DETECTED NOT DETECTED Final   Candida tropicalis NOT DETECTED NOT DETECTED Final   Cryptococcus neoformans/gattii NOT DETECTED NOT DETECTED Final    Comment: Performed at [facility], [address], [place], [place] [address]  Culture, blood (Routine X 2) w Reflex to ID Panel     Status: None   Collection Time: 06/27/23  6:57 PM   Specimen: BLOOD RIGHT HAND  Result Value Ref Range Status   Specimen Description BLOOD RIGHT HAND  Final   Special Requests   Final    BOTTLES DRAWN AEROBIC ONLY Blood Culture results may not be optimal due to an inadequate volume of blood received in culture bottles   Culture   Final    NO GROWTH 5 DAYS Performed at [facility], [address] [place], [place] [address]    Report Status 07/02/2023 FINAL  Final  Resp panel by RT-PCR (RSV, Flu A&B, Covid) Anterior Nasal Swab     Status: None   Collection Time: 06/27/23  8:00 PM   Specimen: Anterior Nasal Swab  Result Value Ref Range Status   SARS Coronavirus 2 by RT PCR NEGATIVE NEGATIVE Final    Influenza A by PCR NEGATIVE NEGATIVE Final   Influenza B by PCR NEGATIVE NEGATIVE Final    Comment: (NOTE) The Xpert Xpress SARS-CoV-2/FLU/RSV plus assay is intended as an aid in the diagnosis of influenza from Nasopharyngeal swab specimens and should not be used as a sole basis for treatment. Nasal washings and aspirates are unacceptable for Xpert Xpress SARS-CoV-2/FLU/RSV testing.  Fact Sheet for Patients: BloggerCourse.com  Fact Sheet for Healthcare Providers: SeriousBroker.it  This test is not yet approved or cleared by the [place] FDA and has been authorized for detection and/or diagnosis of SARS-CoV-2 by FDA under an Emergency Use Authorization (EUA). This EUA will remain in effect (meaning this test can be used) for the duration of the COVID-19 declaration under Section 564(b)(1)  of the Act, 21 U.S.C. section 360bbb-3(b)(1), unless the authorization is terminated or revoked.     Resp Syncytial Virus by PCR NEGATIVE NEGATIVE Final    Comment: (NOTE) Fact Sheet for Patients: BloggerCourse.com  Fact Sheet for Healthcare Providers: SeriousBroker.it  This test is not yet approved or cleared by the [place] FDA and has been authorized for detection and/or diagnosis of SARS-CoV-2 by FDA under an Emergency Use Authorization (EUA). This EUA will remain in effect (meaning this test can be used) for the duration of the COVID-19 declaration under Section 564(b)(1) of the Act, 21 U.S.C. section 360bbb-3(b)(1), unless the authorization is terminated or revoked.  Performed at [facility], [address] [place], [place] [address]   Urine Culture     Status: None   Collection Time: 06/28/23  2:14 AM   Specimen: Urine, Random  Result Value Ref Range Status   Specimen Description URINE, RANDOM  Final   Special Requests NONE Reflexed from [phone]  Final   Culture    Final    NO GROWTH Performed at [facility], [address] [place], [place] [address]    Report Status 06/29/2023 FINAL  Final  MRSA Next Gen by PCR, Nasal     Status: Abnormal   Collection Time: 06/29/23 10:25 PM   Specimen: Nasal Mucosa; Nasal Swab  Result Value Ref Range Status   MRSA by PCR Next Gen DETECTED (A) NOT DETECTED Final    Comment: RESULT CALLED TO, READ BACK BY AND VERIFIED WITH: Q WINSTON,RN@0044  06/30/23 MK (NOTE) The GeneXpert MRSA Assay (FDA approved for NASAL specimens only), is one component of a comprehensive MRSA colonization surveillance program. It is not intended to diagnose MRSA infection nor to guide or monitor treatment for MRSA infections. Test performance is not FDA approved in patients less than 15 years old. Performed at [facility], [address] [place], [place] [address]   Culture, blood (Routine X 2) w Reflex to ID Panel     Status: None (Preliminary result)   Collection Time: 07/02/23 10:39 AM   Specimen: BLOOD LEFT HAND  Result Value Ref Range Status   Specimen Description BLOOD LEFT HAND  Final   Special Requests   Final    BOTTLES DRAWN AEROBIC AND ANAEROBIC Blood Culture adequate volume   Culture   Final    NO GROWTH 2 DAYS Performed at [facility], [address], [place], [place] [address]    Report Status PENDING  Incomplete  Culture, blood (Routine X 2) w Reflex to ID Panel     Status: None (Preliminary result)   Collection Time: 07/02/23 10:46 AM   Specimen: BLOOD LEFT HAND  Result Value Ref Range Status   Specimen Description BLOOD LEFT HAND  Final   Special Requests   Final    BOTTLES DRAWN AEROBIC AND ANAEROBIC Blood Culture adequate volume   Culture   Final    NO GROWTH 2 DAYS Performed at [facility], [address] [place], [place] [address]    Report Status PENDING  Incomplete    Impression/Plan:  1.  MRSA bacteremia.  Concern for CNS embolic process and to get a TEE to evaluate for  endocarditis.  Will continue with vancomycin for good CNS penetration.  2.  End-stage renal disease.  Will continue with vancomycin after dialysis.  3.  Bilateral ischemic stroke.  Presented with aphasia and generalized weakness and MRI findings with multiple scattered foci  of acute infarcts.  Concern for an infectious embolic process and to get a TEE, as above.

## 2023-07-04 NOTE — Care Management Important Message (Signed)
Important Message  Patient Details  Name: [name] MRN: [id number] Date of Birth: 1956-01-06   Important Message Given:  Yes - Medicare IM Spoke with the patient Sister who ask [place] to send the IM to her address.    Jessyka Austria 07/04/2023, 10:21 AM

## 2023-07-04 NOTE — Progress Notes (Signed)
Palliative:  HPI:  68 y.o. male  with past medical history of stroke, CAD s/p CABG, HFmrEF, ESRD on HD MWF, diabetes, HTN, SVT, OSA, upper GIB, esophageal cancer 2022, right eye vision loss, s/p hip fracture s/p repair admitted on 06/27/2023 with slurred speech and AMS due to stroke, uremia (missed HD), and new stroke.   I met today with [name]. He continues to be very lethargic. He is able to track better today and opens eyes more and a little longer but struggles to keep them open long. Very weak cough. I called and discussed in detail with HCPOA/sister [name]. [name] and I review overall poor prognosis. No significant improvement. We discussed TEE and [name] agrees we should cancel. I am very concerned that even if [name] has diet that he is unlikely to be well enough to maintain what his body needs. [name] agrees. She wishes to go forward with one more HD and antibiotics treatment and reassess after. She is preparing herself and will discuss and prepare her family for transition to comfort care if no significant improvement. We discussed the likelihood that he had a rally over the weekend. [name] understands.   I met briefly with [name] at bedside. She attempts to offer [name] a small bite of ice cream but he has lack of awareness and interest. Overall prognosis poor. No change to plan as discussed earlier.   All questions/concerns addressed. Emotional support provided.   Exam: More alert and able to track more today. No distress. Follows some simple commands. Cough to command but ineffective. Breathing regular, unlabored. Abd soft. Generalized weakness.    Plan: - DNR/DNI - Attempt another HD/antibiotic treatment and consideration of transition to full comfort care if no significant improvement   50 min  Yong Channel, NP Palliative Medicine Team Pager [phone] (Please see [email or web address] for schedule) Team Phone [phone]

## 2023-07-04 NOTE — Progress Notes (Signed)
Pharmacy Antibiotic Note  [name] is a 68 y.o. male admitted on 06/27/2023 with  altered mental status with CVA, now found to have Staphylococcus aureus bacteremia .  Pharmacy has been consulted for vancomycin dosing.  [employer] initially detected S. Hominis species via aerobic bottle, but now anaerobic bottle (which appropriately did not have BCID performed due to similar morphology), now growing Staphyloccocus aureus. Anticipate susceptibilities tomorrow. 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 Patient to resume home MWF dialysis per Nephrology.   Plan for TEE. PreHD vanc came back in goal at 18. Repeat cultures are neg so far  Plan: Cont vancomycin 750 mg IV every HD MWF Monitor level at steady state, with random level goal of 15-25  Height: 5\' 10"  (177.8 cm) Weight:  (Bed not zero'd out, and could not get accurate weight) IBW/kg (Calculated) : 73  Temp (24hrs), Avg:98.3 F (36.8 C), Min:97.8 F (36.6 C), Max:98.9 F (37.2 C)  Recent Labs  Lab 06/27/23 1521 06/27/23 1540 06/27/23 1548 06/27/23 1902 06/28/23 1814 06/30/23 1556 06/30/23 2249 07/04/23 0454  WBC  --  7.9  --   --  8.0 7.0  --   --   CREATININE 5.80* 5.66*  --   --  6.30* 5.03*  --   --   LATICACIDVEN  --   --  1.3 1.1  --   --   --   --   VANCORANDOM  --   --   --   --   --   --  11 18    Estimated Creatinine Clearance: 14.8 mL/min (A) (by C-G formula based on SCr of 5.03 mg/dL (H)).    Allergies  Allergen Reactions   Bee Venom Anaphylaxis   Atorvastatin Other (See Comments)    myalgia   Diltiazem Itching   Lopressor [Metoprolol]     NIGHTMARES. Pt tolerating this now 11/25/22   Oxycodone Other (See Comments)    Reaction not listed on MAR    Reglan [Metoclopramide] Other (See Comments)    Suicidal    Rosuvastatin Other (See Comments)    myalgias   Valsartan Itching   Zyrtec [Cetirizine] Other (See Comments)    intolerance    Antimicrobials this admission: Ceftriaxone  1/14 >> 1/17 Doxycycline 1/14 >> 1/17 Vancomycin 1/17 >>   Dose adjustments this admission: 1/20 PreHD vanc 18  Microbiology results: 1/14 BCx: 1/4 S. Hominis, 1/4 MRSA 1/16 MRSA PCR: detected 1/19 Bcx: ngtd  Ulyses Southward, PharmD, BCIDP, AAHIVP, CPP Infectious Disease Pharmacist 07/04/2023 7:27 AM

## 2023-07-04 NOTE — Progress Notes (Signed)
[name] Progress Note   Subjective:  Completed dialysis yesterday - net UF 2L  Seen in room. Awake but doesn't respond to questions  Objective Vitals:   07/03/23 2030 07/03/23 2358 07/04/23 0349 07/04/23 0837  BP: (!) 153/76 (!) 175/73 (!) 166/76 (!) 171/73  Pulse: 70 72 75 72  Resp: 14 14 12 14   Temp: 98.6 F (37 C) 98.9 F (37.2 C) 98 F (36.7 C) 98.3 F (36.8 C)  TempSrc: Axillary Axillary Axillary Axillary  SpO2: 98% 97% 95% 95%  Weight:      Height:         Additional Objective Labs: Basic Metabolic Panel: Recent Labs  Lab 06/27/23 1540 06/28/23 1814 06/30/23 1556  NA 136 136 137  K 4.1 4.0 3.5  CL 96* 97* 98  CO2 25 20* 23  GLUCOSE 101* 89 72  BUN 39* 47* 33*  CREATININE 5.66* 6.30* 5.03*  CALCIUM 8.8* 8.4* 8.2*  PHOS  --  4.6 4.3   CBC: Recent Labs  Lab 06/27/23 1540 06/28/23 1814 06/30/23 1556  WBC 7.9 8.0 7.0  NEUTROABS 6.1  --   --   HGB 10.7* 9.8* 10.2*  HCT 36.3* 32.4* 33.7*  MCV 82.1 81.2 81.4  PLT 223 235 247   Blood Culture    Component Value Date/Time   SDES BLOOD LEFT HAND 07/02/2023 1046   SPECREQUEST  07/02/2023 1046    BOTTLES DRAWN AEROBIC AND ANAEROBIC Blood Culture adequate volume   CULT  07/02/2023 1046    NO GROWTH 2 DAYS Performed at [facility], [address] [place], [place] [address]    REPTSTATUS PENDING 07/02/2023 1046     Physical Exam General: Eyes open, not tracking  Heart: RRR Lungs: Clear  Abdomen: soft -non-tender Extremities: No LE edema  Dialysis Access: L AVF +bruit   Medications:  sodium chloride 20 mL/hr at 07/04/23 0552   vancomycin Stopped (07/03/23 1811)     stroke: early stages of recovery book   Does not apply Once   amiodarone  200 mg Oral BID   aspirin EC  81 mg Oral Daily   calcium acetate  667 mg Oral 3 times per day on Sunday Tuesday Thursday Saturday   And   calcium acetate  667 mg Oral 2 times per day on Monday Wednesday Friday   Chlorhexidine  Gluconate Cloth  6 each Topical Q0600   clopidogrel  75 mg Oral Daily   darbepoetin (ARANESP) injection - DIALYSIS  100 mcg Subcutaneous Q Mon-1800   feeding supplement  237 mL Oral BID BM   heparin  5,000 Units Subcutaneous Q8H   isosorbide mononitrate  30 mg Oral Daily   levothyroxine  50 mcg Oral Daily   metoprolol succinate  50 mg Oral Daily   mupirocin ointment   Nasal BID   mouth rinse  15 mL Mouth Rinse 4 times per day   pantoprazole  40 mg Oral BID   sertraline  100 mg Oral Daily   sodium chloride flush  3 mL Intravenous Once    Dialysis Orders:  [facility] MWF 4:00 400/600 EDW 75.3kg 2K/2.5Ca AVF Hep 5000 Last HD 1/10.  Mircera 200 q 2wks (last 06/16/23) Hectorol 1mg  TIW   Assessment/Plan: Acute CVA - multiple scattered infarcts on MRI. W/u in progress. Per neurology  Acute encephalopathy with aphasia- likely multifactorial, not improved Bacteremia - MRSA bacteremia on BCx 1/14. Repeat cultures pending. On Vanc. ID following. Plans for TEE  ESRD -  HD MWF. Cont on schedule using AVF. Next HD 1/22.   Hypertension/volume  - Permissive HTN per neuro. No gross volume overload   Anemia  - Hgb 9.8. On ESA as outpatient -- ordered for 1/20.   Metabolic bone disease -  Calcium and P acceptable. Continue home meds when tolerating po  H/o SVT - on amiodarone  T2DM - per primary  GOC - Palliative care consulted - Code status now DNR/DNI. Ongoing GOC discussions.   Tomasa Blase PA-C [facility] 07/04/2023,10:47 AM

## 2023-07-04 NOTE — Progress Notes (Signed)
PROGRESS NOTE    [name]  BJY:[id number] DOB: [birth year] DOA: 06/27/2023 PCP: Babs Sciara, MD   Brief Narrative:  [name] is a 68 y.o. male with medical history significant of CAD s/p CABG, combined CHF, ESRD MWF, type 2 diabetes, hypertension, SVT, OSA, upper GI bleed who presented with slurred speech and AMS.   Pt moved into SNF about 9 months ago after a hospitalization. He was living along prior to that. He fractured his right hip last April and had declined and is now mostly bedbound and wheelchair bound. Caretaker last saw him well about 6 days ago on 1/9. He was diagnosed with right sided pneumonia on 1/7. Last dialysis session was Thursday 1/9. Normally schedule is MWF but he reportedly refused it a day prior which is unlike him.   Upon arrival, he was hemodynamically stable.  He presented as a code stroke and was reported to be disoriented to month, not following commands, bilateral arm weakness, bilateral leg weakness, aphasia, dysarthria on exam.  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  Severe stenosis in the proximal right super clinoid ICA and moderate stenosis in the left supraclinoid ICA.  60% stenosis in the proximal right ICA severe stenosis at the origin of the right vertebral artery with additional severe stenosis in the right V2 segment. Chest x-ray demonstrated small right pleural effusion with patchy opacity.  There is also streaky left basilar opacity. CBC without leukocytosis, anemia stable with hemoglobin of 10.7.  BMP with creatinine of 5.66 with his previous ranging around 3-5.  Further details below.  Assessment & Plan:   Principal Problem:   Stroke [facility]) Active Problems:   Chronic combined systolic and diastolic CHF (congestive heart failure) (HCC)   HTN (hypertension)   Pleural effusion   Type 2 diabetes mellitus (HCC)   Obstructive  sleep apnea   S/P CABG (coronary artery bypass graft)   ESRD (end stage renal disease) on dialysis (HCC)   Abnormal TSH   History of supraventricular tachycardia   Acute ischemic stroke (HCC)   Bacteremia   Pressure injury of skin  Bilateral acute ischemic stroke (HCC) Pt presented with aphasia, generalized weakness from SNF -CTA head and neck demonstrated numerous areas of stenosis including 60% stenosis in the proximal right ICA. MRI brain shows Multiple scattered foci of acute infarct in the bilateral cerebral Hemispheres.  Patient started on aspirin and Plavix and neuro recommends to continue DAPT for 3 weeks followed by aspirin alone.  Due to patient being n.p.o and failing swallow he was on aspirin suppository but passed swallow on 07/01/2023 so all oral medications were switched to oral route however patient has been either refusing at times or has been too lethargic to take any p.o. medications so medications are being missed at times.Neurology Signed off 06/29/2023. 2D Echo ejection fraction 45 to 50%.  Left atrium moderately dilated severe mitral annulus calcification LDL 24 HgbA1c 8.1 VTE prophylaxis -subcutaneous heparin  Dysphagia: Passed bedside swallow but remains lethargic, high risk for aspiration.  Possible aspiration pneumonia/right pleural effusion which is trivial/possible UTI?: Chest x-ray shows right pleural effusion and atelectasis and patchy right basilar opacity/infiltrate. No leukocytosis and he is afebrile but procalcitonin slightly elevated. Cannot rule out aspiration pneumonia clinically.  Patient on vancomycin transitioned from Rocephin to Zithromax since the next day of admission.  Gram-positive # MRSA bacteremia: Patient was found  to have MRSA bacteremia upon admission from cultures drawn on 06/27/2023.  Source remains unclear.  Repeat cultures done 07/02/2023.  ID recommended TEE.  Cardiology was consulted and patient was planned to have TEE today after approval  from the sister however since patient's condition has gotten worse in last 2 days, sister has decided to forego TEE and she is considering comfort care.  Palliative care leading the conversation.   Chronic systolic congestive heart failure: Latest echo done in September 2024 shows 35 to 40% ejection fraction.  Patient appears dry.     HTN (hypertension) Blood pressure improving, patient has been refusing medications at times, nursing has been advised to try again if he does so.  Continue Imdur, Toprol-XL.     Type 2 diabetes mellitus (HCC) - Uncontrolled.  Hemoglobin A1c of 8.1 in March and 6.4 this admission.  Continue SSI.   Obstructive sleep apnea -not on CPAP but has nighttime O2 supplement   History of supraventricular tachycardia - Continue amiodarone and beta-blocker however patient is NPO.   Abnormal TSH -TSH elevated to 11 but was just normal in September.  On Synthroid 37.5 mcg, increased to 50 mcg.   ESRD (end stage renal disease) on dialysis [facility]) -scheduled MWF. Last session on Thursday prior to admission and then on Wednesday.  Appreciate nephrology help.     S/P CABG (coronary artery bypass graft) -continue aspirin  History of SVT: On amiodarone PTA, was on hold due to being n.p.o. but no reports of SVT during this hospitalization.  Resumed amiodarone.   Goal of care: Patient was already at the nursing home and mostly bedbound due to previous strokes and multiple comorbidities, now has incomprehensible speech at times and unable to move extremities at all and failing swallow evaluation.  Carries very poor prognosis.  Palliative care consulted.  Family discussions took place.  Since he improved after dialysis, family desired full aggressive care and pursuing TEE as well however he has taken the turn for the worse so sister does not want TEE anymore and she is going to have conversation with other family members and she is considering comfort care.  Appreciate palliative care  helping with this.  DVT prophylaxis: heparin injection 5,000 Units Start: 06/27/23 2200   Code Status: Limited: Do not attempt resuscitation (DNR) -DNR-LIMITED -Do Not Intubate/DNI   Family Communication:  None present at bedside.    Status is: Inpatient Remains inpatient appropriate because: Patient sick, may need discharge to inpatient hospice.  Awaiting further decisions by family.     Estimated body mass index is 22.84 kg/m as calculated from the following:   Height as of this encounter: 5\' 10"  (1.778 m).   Weight as of this encounter: 72.2 kg.  Pressure Injury 06/28/23 Heel Left;Medial Stage 1 -  Intact skin with non-blanchable redness of a localized area usually over a bony prominence. (Active)  06/28/23 1530  Location: Heel  Location Orientation: Left;Medial  Staging: Stage 1 -  Intact skin with non-blanchable redness of a localized area usually over a bony prominence.  Wound Description (Comments):   Present on Admission: Yes  Dressing Type Foam - Lift dressing to assess site every shift 07/04/23 0400     Pressure Injury 06/28/23 Ankle Right;Lateral Stage 1 -  Intact skin with non-blanchable redness of a localized area usually over a bony prominence. (Active)  06/28/23 1530  Location: Ankle  Location Orientation: Right;Lateral  Staging: Stage 1 -  Intact skin with non-blanchable redness of a localized area  usually over a bony prominence.  Wound Description (Comments):   Present on Admission: Yes  Dressing Type Foam - Lift dressing to assess site every shift 07/04/23 0400     Pressure Injury 06/28/23 Coccyx Medial Stage 2 -  Partial thickness loss of dermis presenting as a shallow open injury with a red, pink wound bed without slough. (Active)  06/28/23 1530  Location: Coccyx  Location Orientation: Medial  Staging: Stage 2 -  Partial thickness loss of dermis presenting as a shallow open injury with a red, pink wound bed without slough.  Wound Description (Comments):    Present on Admission: Yes  Dressing Type Foam - Lift dressing to assess site every shift 07/04/23 0400   Nutritional Assessment: Body mass index is 22.84 kg/m.Marland Kitchen Seen by dietician.  I agree with the assessment and plan as outlined below: Nutrition Status:        . Skin Assessment: I have examined the patient's skin and I agree with the wound assessment as performed by the wound care RN as outlined below: Pressure Injury 06/28/23 Heel Left;Medial Stage 1 -  Intact skin with non-blanchable redness of a localized area usually over a bony prominence. (Active)  06/28/23 1530  Location: Heel  Location Orientation: Left;Medial  Staging: Stage 1 -  Intact skin with non-blanchable redness of a localized area usually over a bony prominence.  Wound Description (Comments):   Present on Admission: Yes  Dressing Type Foam - Lift dressing to assess site every shift 07/04/23 0400     Pressure Injury 06/28/23 Ankle Right;Lateral Stage 1 -  Intact skin with non-blanchable redness of a localized area usually over a bony prominence. (Active)  06/28/23 1530  Location: Ankle  Location Orientation: Right;Lateral  Staging: Stage 1 -  Intact skin with non-blanchable redness of a localized area usually over a bony prominence.  Wound Description (Comments):   Present on Admission: Yes  Dressing Type Foam - Lift dressing to assess site every shift 07/04/23 0400     Pressure Injury 06/28/23 Coccyx Medial Stage 2 -  Partial thickness loss of dermis presenting as a shallow open injury with a red, pink wound bed without slough. (Active)  06/28/23 1530  Location: Coccyx  Location Orientation: Medial  Staging: Stage 2 -  Partial thickness loss of dermis presenting as a shallow open injury with a red, pink wound bed without slough.  Wound Description (Comments):   Present on Admission: Yes  Dressing Type Foam - Lift dressing to assess site every shift 07/04/23 0400    Consultants:  Neurology  Procedures:   None  Antimicrobials:  Anti-infectives (From admission, onward)    Start     Dose/Rate Route Frequency Ordered Stop   07/03/23 1830  vancomycin (VANCOCIN) 750 mg in sodium chloride 0.9 % 250 mL IVPB       Placed in "Followed by" Linked Group   750 mg 250 mL/hr over 60 Minutes Intravenous Every M-W-F (Hemodialysis) 07/03/23 1520     07/03/23 1800  vancomycin (VANCOCIN) 750 mg in sodium chloride 0.9 % 250 mL IVPB  Status:  Discontinued       Placed in "Followed by" Linked Group   750 mg 265 mL/hr over 60 Minutes Intravenous Every M-W-F (Hemodialysis) 07/03/23 1337 07/03/23 1520   07/03/23 1200  vancomycin (VANCOREADY) IVPB 750 mg/150 mL  Status:  Discontinued       Placed in "Followed by" Linked Group   750 mg 150 mL/hr over 60 Minutes Intravenous Every M-W-F (Hemodialysis)  06/30/23 1016 06/30/23 1440   07/01/23 0345  vancomycin (VANCOREADY) IVPB 500 mg/100 mL        500 mg 100 mL/hr over 60 Minutes Intravenous  Once 07/01/23 0257 07/01/23 0414   06/30/23 1600  vancomycin (VANCOREADY) IVPB 750 mg/150 mL  Status:  Discontinued       Placed in "Followed by" Linked Group   750 mg 150 mL/hr over 60 Minutes Intravenous Every M-W-F (Hemodialysis) 06/30/23 1440 06/30/23 1507   06/30/23 1600  vancomycin (VANCOREADY) IVPB 750 mg/150 mL  Status:  Discontinued       Placed in "Followed by" Linked Group   750 mg 150 mL/hr over 60 Minutes Intravenous Every M-W-F (Hemodialysis) 06/30/23 1507 07/03/23 1337   06/30/23 1100  Vancomycin (VANCOCIN) 1,500 mg in sodium chloride 0.9 % 500 mL IVPB       Placed in "Followed by" Linked Group   1,500 mg 250 mL/hr over 120 Minutes Intravenous  Once 06/30/23 1016 06/30/23 1510   06/28/23 0700  doxycycline (VIBRAMYCIN) 100 mg in dextrose 5 % 250 mL IVPB  Status:  Discontinued        100 mg 125 mL/hr over 120 Minutes Intravenous Every 12 hours 06/27/23 2028 06/30/23 1104   06/27/23 1700  azithromycin (ZITHROMAX) 500 mg in sodium chloride 0.9 % 250 mL IVPB   Status:  Discontinued        500 mg 250 mL/hr over 60 Minutes Intravenous Every 24 hours 06/27/23 1650 06/27/23 2028   06/27/23 1700  cefTRIAXone (ROCEPHIN) 1 g in sodium chloride 0.9 % 100 mL IVPB  Status:  Discontinued        1 g 200 mL/hr over 30 Minutes Intravenous Every 24 hours 06/27/23 1650 06/30/23 1104         Subjective: Patient seen and examined with nurse present at the bedside.  Patient is awake, tracks as well but would not respond to any questions.  Not following commands either.  Objective: Vitals:   07/03/23 1803 07/03/23 2030 07/03/23 2358 07/04/23 0349  BP: (!) 146/76 (!) 153/76 (!) 175/73 (!) 166/76  Pulse: 74 70 72 75  Resp: 14 14 14 12   Temp: 97.8 F (36.6 C) 98.6 F (37 C) 98.9 F (37.2 C) 98 F (36.7 C)  TempSrc: Oral Axillary Axillary Axillary  SpO2: 94% 98% 97% 95%  Weight:      Height:        Intake/Output Summary (Last 24 hours) at 07/04/2023 0825 Last data filed at 07/03/2023 2100 Gross per 24 hour  Intake --  Output 2 ml  Net -2 ml   Filed Weights   06/28/23 1804 06/28/23 2158 06/30/23 1450  Weight: 74.8 kg 72.2 kg 72.2 kg    Examination:  General exam: Appears calm and comfortable but slightly lethargic and dehydrated Respiratory system: Clear to auscultation. Respiratory effort normal. Cardiovascular system: S1 & S2 heard, RRR. No JVD, murmurs, rubs, gallops or clicks. No pedal edema. Gastrointestinal system: Abdomen is nondistended, soft and nontender. No organomegaly or masses felt. Normal bowel sounds heard. Central nervous system: Alert but not oriented.  Not following commands today.  Data Reviewed: I have personally reviewed following labs and imaging studies  CBC: Recent Labs  Lab 06/27/23 1521 06/27/23 1540 06/28/23 1814 06/30/23 1556  WBC  --  7.9 8.0 7.0  NEUTROABS  --  6.1  --   --   HGB 11.9* 10.7* 9.8* 10.2*  HCT 35.0* 36.3* 32.4* 33.7*  MCV  --  82.1 81.2  81.4  PLT  --  223 235 247   Basic Metabolic  Panel: Recent Labs  Lab 06/27/23 1521 06/27/23 1540 06/28/23 1814 06/30/23 1556  NA 136 136 136 137  K 4.0 4.1 4.0 3.5  CL 97* 96* 97* 98  CO2  --  25 20* 23  GLUCOSE 102* 101* 89 72  BUN 38* 39* 47* 33*  CREATININE 5.80* 5.66* 6.30* 5.03*  CALCIUM  --  8.8* 8.4* 8.2*  PHOS  --   --  4.6 4.3   GFR: Estimated Creatinine Clearance: 14.8 mL/min (A) (by C-G formula based on SCr of 5.03 mg/dL (H)). Liver Function Tests: Recent Labs  Lab 06/27/23 1540 06/28/23 1814 06/30/23 1556  AST 11*  --   --   ALT 7  --   --   ALKPHOS 91  --   --   BILITOT 1.1  --   --   PROT 6.3*  --   --   ALBUMIN 2.7* 2.6* 2.5*   No results for input(s): "LIPASE", "AMYLASE" in the last 168 hours. No results for input(s): "AMMONIA" in the last 168 hours. Coagulation Profile: Recent Labs  Lab 06/27/23 1540  INR 1.1   Cardiac Enzymes: No results for input(s): "CKTOTAL", "CKMB", "CKMBINDEX", "TROPONINI" in the last 168 hours. BNP (last 3 results) No results for input(s): "PROBNP" in the last 8760 hours. HbA1C: No results for input(s): "HGBA1C" in the last 72 hours.  CBG: Recent Labs  Lab 07/02/23 1320 07/02/23 2331 07/03/23 0623 07/04/23 0009 07/04/23 0618  GLUCAP 108* 97 118* 110* 107*   Lipid Profile: No results for input(s): "CHOL", "HDL", "LDLCALC", "TRIG", "CHOLHDL", "LDLDIRECT" in the last 72 hours.  Thyroid Function Tests: No results for input(s): "TSH", "T4TOTAL", "FREET4", "T3FREE", "THYROIDAB" in the last 72 hours.  Anemia Panel: No results for input(s): "VITAMINB12", "FOLATE", "FERRITIN", "TIBC", "IRON", "RETICCTPCT" in the last 72 hours. Sepsis Labs: Recent Labs  Lab 06/27/23 1548 06/27/23 1902 06/27/23 1919  PROCALCITON  --   --  0.35  LATICACIDVEN 1.3 1.1  --     Recent Results (from the past 240 hours)  Culture, blood (Routine X 2) w Reflex to ID Panel     Status: Abnormal   Collection Time: 06/27/23  4:48 PM   Specimen: BLOOD RIGHT ARM  Result Value Ref  Range Status   Specimen Description BLOOD RIGHT ARM  Final   Special Requests   Final    BOTTLES DRAWN AEROBIC AND ANAEROBIC Blood Culture results may not be optimal due to an inadequate volume of blood received in culture bottles   Culture  Setup Time   Final    GRAM POSITIVE COCCI IN CLUSTERS IN BOTH AEROBIC AND ANAEROBIC BOTTLES CRITICAL RESULT CALLED TO, READ BACK BY AND VERIFIED WITH: PHARMD K. AMEND [id number] @2101  FH    Culture (A)  Final    STAPHYLOCOCCUS HOMINIS METHICILLIN RESISTANT STAPHYLOCOCCUS AUREUS THE SIGNIFICANCE OF ISOLATING THIS ORGANISM FROM A SINGLE SET OF BLOOD CULTURES WHEN MULTIPLE SETS ARE DRAWN IS UNCERTAIN. PLEASE NOTIFY THE MICROBIOLOGY DEPARTMENT WITHIN ONE WEEK IF SPECIATION AND SENSITIVITIES ARE REQUIRED. FOR STAPHYLOCOCCUS HOMINIS CRITICAL RESULT CALLED TO, READ BACK BY AND VERIFIED WITH: PHARMD B AGEE [id number] AT 1010 BY CM Performed at [facility], [address] [place], [place] [address]    Report Status 07/01/2023 FINAL  Final   Organism ID, Bacteria METHICILLIN RESISTANT STAPHYLOCOCCUS AUREUS  Final      Susceptibility   Methicillin resistant staphylococcus aureus - MIC*  CIPROFLOXACIN >=8 RESISTANT Resistant     ERYTHROMYCIN >=8 RESISTANT Resistant     GENTAMICIN <=0.5 SENSITIVE Sensitive     OXACILLIN >=4 RESISTANT Resistant     TETRACYCLINE <=1 SENSITIVE Sensitive     VANCOMYCIN 1 SENSITIVE Sensitive     TRIMETH/SULFA <=10 SENSITIVE Sensitive     CLINDAMYCIN >=8 RESISTANT Resistant     RIFAMPIN <=0.5 SENSITIVE Sensitive     Inducible Clindamycin NEGATIVE Sensitive     LINEZOLID 2 SENSITIVE Sensitive     * METHICILLIN RESISTANT STAPHYLOCOCCUS AUREUS  Blood Culture ID Panel (Reflexed)     Status: Abnormal   Collection Time: 06/27/23  4:48 PM  Result Value Ref Range Status   Enterococcus faecalis NOT DETECTED NOT DETECTED Final   Enterococcus Faecium NOT DETECTED NOT DETECTED Final   Listeria monocytogenes NOT DETECTED NOT DETECTED  Final   Staphylococcus species DETECTED (A) NOT DETECTED Final    Comment: CRITICAL RESULT CALLED TO, READ BACK BY AND VERIFIED WITH: PHARMD K. AMEND [id number] @2101  FH    Staphylococcus aureus (BCID) NOT DETECTED NOT DETECTED Final   Staphylococcus epidermidis NOT DETECTED NOT DETECTED Final   Staphylococcus lugdunensis NOT DETECTED NOT DETECTED Final   Streptococcus species NOT DETECTED NOT DETECTED Final   Streptococcus agalactiae NOT DETECTED NOT DETECTED Final   Streptococcus pneumoniae NOT DETECTED NOT DETECTED Final   Streptococcus pyogenes NOT DETECTED NOT DETECTED Final   A.calcoaceticus-baumannii NOT DETECTED NOT DETECTED Final   Bacteroides fragilis NOT DETECTED NOT DETECTED Final   Enterobacterales NOT DETECTED NOT DETECTED Final   Enterobacter cloacae complex NOT DETECTED NOT DETECTED Final   Escherichia coli NOT DETECTED NOT DETECTED Final   Klebsiella aerogenes NOT DETECTED NOT DETECTED Final   Klebsiella oxytoca NOT DETECTED NOT DETECTED Final   Klebsiella pneumoniae NOT DETECTED NOT DETECTED Final   Proteus species NOT DETECTED NOT DETECTED Final   Salmonella species NOT DETECTED NOT DETECTED Final   Serratia marcescens NOT DETECTED NOT DETECTED Final   Haemophilus influenzae NOT DETECTED NOT DETECTED Final   Neisseria meningitidis NOT DETECTED NOT DETECTED Final   Pseudomonas aeruginosa NOT DETECTED NOT DETECTED Final   Stenotrophomonas maltophilia NOT DETECTED NOT DETECTED Final   Candida albicans NOT DETECTED NOT DETECTED Final   Candida auris NOT DETECTED NOT DETECTED Final   Candida glabrata NOT DETECTED NOT DETECTED Final   Candida krusei NOT DETECTED NOT DETECTED Final   Candida parapsilosis NOT DETECTED NOT DETECTED Final   Candida tropicalis NOT DETECTED NOT DETECTED Final   Cryptococcus neoformans/gattii NOT DETECTED NOT DETECTED Final    Comment: Performed at [facility], [address], [place], [place] [address]  Culture, blood (Routine X 2) w  Reflex to ID Panel     Status: None   Collection Time: 06/27/23  6:57 PM   Specimen: BLOOD RIGHT HAND  Result Value Ref Range Status   Specimen Description BLOOD RIGHT HAND  Final   Special Requests   Final    BOTTLES DRAWN AEROBIC ONLY Blood Culture results may not be optimal due to an inadequate volume of blood received in culture bottles   Culture   Final    NO GROWTH 5 DAYS Performed at [facility], [address], [place], [place] [address]    Report Status 07/02/2023 FINAL  Final  Resp panel by RT-PCR (RSV, Flu A&B, Covid) Anterior Nasal Swab     Status: None   Collection Time: 06/27/23  8:00 PM   Specimen: Anterior Nasal  Swab  Result Value Ref Range Status   SARS Coronavirus 2 by RT PCR NEGATIVE NEGATIVE Final   Influenza A by PCR NEGATIVE NEGATIVE Final   Influenza B by PCR NEGATIVE NEGATIVE Final    Comment: (NOTE) The Xpert Xpress SARS-CoV-2/FLU/RSV plus assay is intended as an aid in the diagnosis of influenza from Nasopharyngeal swab specimens and should not be used as a sole basis for treatment. Nasal washings and aspirates are unacceptable for Xpert Xpress SARS-CoV-2/FLU/RSV testing.  Fact Sheet for Patients: BloggerCourse.com  Fact Sheet for Healthcare Providers: SeriousBroker.it  This test is not yet approved or cleared by the [place] FDA and has been authorized for detection and/or diagnosis of SARS-CoV-2 by FDA under an Emergency Use Authorization (EUA). This EUA will remain in effect (meaning this test can be used) for the duration of the COVID-19 declaration under Section 564(b)(1) of the Act, 21 U.S.C. section 360bbb-3(b)(1), unless the authorization is terminated or revoked.     Resp Syncytial Virus by PCR NEGATIVE NEGATIVE Final    Comment: (NOTE) Fact Sheet for Patients: BloggerCourse.com  Fact Sheet for Healthcare  Providers: SeriousBroker.it  This test is not yet approved or cleared by the [place] FDA and has been authorized for detection and/or diagnosis of SARS-CoV-2 by FDA under an Emergency Use Authorization (EUA). This EUA will remain in effect (meaning this test can be used) for the duration of the COVID-19 declaration under Section 564(b)(1) of the Act, 21 U.S.C. section 360bbb-3(b)(1), unless the authorization is terminated or revoked.  Performed at [facility], [address], [place], [place] [address]   Urine Culture     Status: None   Collection Time: 06/28/23  2:14 AM   Specimen: Urine, Random  Result Value Ref Range Status   Specimen Description URINE, RANDOM  Final   Special Requests NONE Reflexed from [phone]  Final   Culture   Final    NO GROWTH Performed at [facility], [address], [place], [place] [address]    Report Status 06/29/2023 FINAL  Final  MRSA Next Gen by PCR, Nasal     Status: Abnormal   Collection Time: 06/29/23 10:25 PM   Specimen: Nasal Mucosa; Nasal Swab  Result Value Ref Range Status   MRSA by PCR Next Gen DETECTED (A) NOT DETECTED Final    Comment: RESULT CALLED TO, READ BACK BY AND VERIFIED WITH: Q WINSTON,RN@0044  06/30/23 MK (NOTE) The GeneXpert MRSA Assay (FDA approved for NASAL specimens only), is one component of a comprehensive MRSA colonization surveillance program. It is not intended to diagnose MRSA infection nor to guide or monitor treatment for MRSA infections. Test performance is not FDA approved in patients less than 40 years old. Performed at [facility], [address], [place], [place] [address]   Culture, blood (Routine X 2) w Reflex to ID Panel     Status: None (Preliminary result)   Collection Time: 07/02/23 10:39 AM   Specimen: BLOOD LEFT HAND  Result Value Ref Range Status   Specimen Description BLOOD LEFT HAND  Final   Special Requests   Final    BOTTLES DRAWN AEROBIC  AND ANAEROBIC Blood Culture adequate volume   Culture   Final    NO GROWTH < 24 HOURS Performed at [facility], [address] [place], [place] [address]    Report Status PENDING  Incomplete  Culture, blood (Routine X 2) w Reflex to ID Panel  Status: None (Preliminary result)   Collection Time: 07/02/23 10:46 AM   Specimen: BLOOD LEFT HAND  Result Value Ref Range Status   Specimen Description BLOOD LEFT HAND  Final   Special Requests   Final    BOTTLES DRAWN AEROBIC AND ANAEROBIC Blood Culture adequate volume   Culture   Final    NO GROWTH < 24 HOURS Performed at [facility], [address], [place], [place] [address]    Report Status PENDING  Incomplete     Radiology Studies: No results found.   Scheduled Meds:   stroke: early stages of recovery book   Does not apply Once   amiodarone  200 mg Oral BID   aspirin EC  81 mg Oral Daily   calcium acetate  667 mg Oral 3 times per day on Sunday Tuesday Thursday Saturday   And   calcium acetate  667 mg Oral 2 times per day on Monday Wednesday Friday   Chlorhexidine Gluconate Cloth  6 each Topical Q0600   clopidogrel  75 mg Oral Daily   darbepoetin (ARANESP) injection - DIALYSIS  100 mcg Subcutaneous Q Mon-1800   feeding supplement  237 mL Oral BID BM   heparin  5,000 Units Subcutaneous Q8H   isosorbide mononitrate  30 mg Oral Daily   levothyroxine  50 mcg Oral Daily   metoprolol succinate  50 mg Oral Daily   mupirocin ointment   Nasal BID   mouth rinse  15 mL Mouth Rinse 4 times per day   pantoprazole  40 mg Oral BID   sertraline  100 mg Oral Daily   sodium chloride flush  3 mL Intravenous Once   Continuous Infusions:  sodium chloride 20 mL/hr at 07/04/23 0552   vancomycin Stopped (07/03/23 1811)     LOS: 6 days   Hughie Closs, MD [facility]  07/04/2023, 8:25 AM   *Please note that this is a verbal dictation therefore any spelling or grammatical errors are due to the "Dragon Medical One"  system interpretation.  Please page via Amion and do not message via secure chat for urgent patient care matters. Secure chat can be used for non urgent patient care matters.  How to contact the [facility] Attending or Consulting provider 7A - 7P or covering provider during after hours 7P -7A, for this patient?  Check the care team in [facility] and look for a) attending/consulting TRH provider listed and b) the [facility] team listed. Page or secure chat 7A-7P. Log into [email or web address] and use [place]'s universal password to access. If you do not have the password, please contact the hospital operator. Locate the [facility] provider you are looking for under [facility] and page to a number that you can be directly reached. If you still have difficulty reaching the provider, please page the [facility] (Director on Call) for the Hospitalists listed on amion for assistance.

## 2023-07-05 DIAGNOSIS — Z7189 Other specified counseling: Secondary | ICD-10-CM | POA: Diagnosis not present

## 2023-07-05 DIAGNOSIS — I639 Cerebral infarction, unspecified: Secondary | ICD-10-CM | POA: Diagnosis not present

## 2023-07-05 DIAGNOSIS — Z515 Encounter for palliative care: Secondary | ICD-10-CM | POA: Diagnosis not present

## 2023-07-05 DIAGNOSIS — R630 Anorexia: Secondary | ICD-10-CM

## 2023-07-05 LAB — CBC WITH DIFFERENTIAL/PLATELET
Abs Immature Granulocytes: 0.04 10*3/uL (ref 0.00–0.07)
Basophils Absolute: 0.1 10*3/uL (ref 0.0–0.1)
Basophils Relative: 1 %
Eosinophils Absolute: 0.1 10*3/uL (ref 0.0–0.5)
Eosinophils Relative: 2 %
HCT: 41.4 % (ref 39.0–52.0)
Hemoglobin: 12.5 g/dL — ABNORMAL LOW (ref 13.0–17.0)
Immature Granulocytes: 1 %
Lymphocytes Relative: 14 %
Lymphs Abs: 1.1 10*3/uL (ref 0.7–4.0)
MCH: 24.5 pg — ABNORMAL LOW (ref 26.0–34.0)
MCHC: 30.2 g/dL (ref 30.0–36.0)
MCV: 81.2 fL (ref 80.0–100.0)
Monocytes Absolute: 0.6 10*3/uL (ref 0.1–1.0)
Monocytes Relative: 8 %
Neutro Abs: 5.7 10*3/uL (ref 1.7–7.7)
Neutrophils Relative %: 74 %
Platelets: 304 10*3/uL (ref 150–400)
RBC: 5.1 MIL/uL (ref 4.22–5.81)
RDW: 20 % — ABNORMAL HIGH (ref 11.5–15.5)
WBC: 7.6 10*3/uL (ref 4.0–10.5)
nRBC: 0 % (ref 0.0–0.2)

## 2023-07-05 LAB — RENAL FUNCTION PANEL
Albumin: 2.9 g/dL — ABNORMAL LOW (ref 3.5–5.0)
Anion gap: 20 — ABNORMAL HIGH (ref 5–15)
BUN: 33 mg/dL — ABNORMAL HIGH (ref 8–23)
CO2: 22 mmol/L (ref 22–32)
Calcium: 8.9 mg/dL (ref 8.9–10.3)
Chloride: 96 mmol/L — ABNORMAL LOW (ref 98–111)
Creatinine, Ser: 5.08 mg/dL — ABNORMAL HIGH (ref 0.61–1.24)
GFR, Estimated: 12 mL/min — ABNORMAL LOW (ref >60)
Glucose, Bld: 146 mg/dL — ABNORMAL HIGH (ref 70–99)
Phosphorus: 4.5 mg/dL (ref 2.5–4.6)
Potassium: 3.9 mmol/L (ref 3.5–5.1)
Sodium: 138 mmol/L (ref 135–145)

## 2023-07-05 LAB — GLUCOSE, CAPILLARY
Glucose-Capillary: 129 mg/dL — ABNORMAL HIGH (ref 70–99)
Glucose-Capillary: 154 mg/dL — ABNORMAL HIGH (ref 70–99)
Glucose-Capillary: 110 mg/dL — ABNORMAL HIGH (ref 70–99)

## 2023-07-05 MED ORDER — HEPARIN SODIUM (PORCINE) 1000 UNIT/ML DIALYSIS: 5000.0000 [IU] | INTRAMUSCULAR | Status: DC | PRN

## 2023-07-05 NOTE — Progress Notes (Signed)
[facility] Progress Note Patient: [name] YQM:[id number] DOB: 10-25-1955 DOA: 06/27/2023  DOS: the patient was seen and examined on 07/05/2023  Brief Hospital Course: PMH of CAD s/p CABG, combined systolic and diastolic CHF, ESRD MWF, type 2 diabetes, hypertension, SVT, OSA, upper GI bleed who presented with slurred speech and AMS.  Pt moved into SNF about 9 months ago after a hospitalization. He was living along prior to that. He fractured his right hip last April and had declined and is now mostly bedbound and wheelchair bound. Caretaker last saw him well on 1/9. He was diagnosed with right sided pneumonia on 1/7. Last dialysis session was Thursday 1/9. Normally schedule is MWF but he reportedly refused it a day prior which is unlike him.   Presented as a code stroke, CTA multiple stenosis.  MRI confirmed multiple scattered acute strokes.  Neurology was consulted.   For ESRD nephrology was consulted for HD. ID was consulted for possible staph bacteremia. As the patient has poor mentation and continues to decline further, palliative care was consulted and family is leaning towards comfort.  Currently DNR. Assessment and Plan: Acute ischemic bilateral hemispheres punctate infarct. CT head negative. CTA shows evidence of multiple stenosis. CT perfusion scan no penumbra. MRI brain confirmed multiple foci of scattered acute infarct in the bilateral cerebral hemisphere. Echocardiogram shows EF 45 to 50%.  LDL 24.  Hemoglobin A1c 8.1. Not on any antithrombotic therapy prior to admission Later on recommendation was aspirin and Plavix for 3 weeks followed by aspirin alone. Neuro signed off on 1/16.  Dysphagia. Speech therapy following. Secondary to stroke. On D2 diet thin liquid. Oral intake is minimal.  ESRD on HD.  MWF. Nephrology following. Plan is to follow-up on patient's progression after hemodialysis.  MRSA bacteremia. Concern for CNS embolic process in the setting of  presentation with acute stroke as well as bacteremia. TEE was requested but family currently leaning towards more comfort approach. Continue with IV antibiotics with dialysis. Appreciate ID consultation.  Acute encephalopathy, associated with a stroke as well as infection. Multifactorial. Less likely toxic. Most likely metabolic. Monitor.  Anemia of chronic kidney disease. H&H stable. Receiving iron aspirin.  Chronic systolic and diastolic CHF. HTN. History of SVT. Echo in the past had EF of 35 to 40%. Echo at this time around improved 45 to 50%. Clinically euvolemic.  Volume management with HD. Blood pressure improving. Currently on Imdur and Toprol-XL. Also on amiodarone.  Monitor.  CAD SP CABG. On aspirin and Plavix.  OSA. On nighttime O2. Not on CPAP. Monitor.  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 Currently on sliding scale insulin.  Hypothyroidism. On Synthroid.  Neuropathy. Obtained.  Currently on hold.  Chronic pain syndrome. On fentanyl patch. Dose increased to 50 mcg per palliative care.  Mood disorder. On Zoloft. Continue   Subjective: No nausea no vomiting no fever no chills.  Normal oral intake.  Seen at EGD.  Nonverbal.  Unable to follow any commands.  Physical Exam: General: in Mild distress, No Rash Cardiovascular: S1 and S2 Present, No Murmur Respiratory: Good respiratory effort, Bilateral Air entry present. No Crackles, No wheezes Abdomen: Bowel Sound present, No tenderness Extremities: No edema Neuro: Alert and moans but unable to verbalize any words.    Data Reviewed: I have Reviewed nursing notes, Vitals, and Lab results. Since last encounter, pertinent lab results CBC and BMP   .   Disposition: Status is: Inpatient Remains inpatient  appropriate because: Awaiting improvement in mentation and oral intake.  versus clarity on goals of care   heparin  injection 5,000 Units Start: 06/27/23 2200   Family Communication: No one at bedside Level of care: Telemetry Medical   Vitals:   07/05/23 1530 07/05/23 1545 07/05/23 1600 07/05/23 1605  BP: (!) 101/52 (!) 102/53 (!) 106/55 (!) 123/56  Pulse: 73 73 72 71  Resp: 13 13 17 13   Temp:    97.6 F (36.4 C)  TempSrc:      SpO2: 94% 95% 95% 94%  Weight:      Height:         Author: Lynden Oxford, MD 07/05/2023 5:10 PM  Please look on [email or web address] to find out who is on call.

## 2023-07-05 NOTE — Progress Notes (Signed)
   07/05/23 1605  Vitals  Temp 97.6 F (36.4 C)  Pulse Rate 71  Resp 13  BP (!) 123/56  SpO2 94 %  Post Treatment  Dialyzer Clearance Clear  Hemodialysis Intake (mL) 250 mL  Liters Processed 72  Fluid Removed (mL) 1600 mL  Tolerated HD Treatment Yes  AVG/AVF Arterial Site Held (minutes) 8 minutes  AVG/AVF Venous Site Held (minutes) 8 minutes   Received patient in bed to unit.  Alert and oriented.  Informed consent signed and in chart.   TX duration:3HRS  Patient tolerated well.  Transported back to the room  Alert, without acute distress.  Hand-off given to patient's nurse.   Access used: LAVF Access issues: NONE  Total UF removed: 1.6L Medication(s) given: VANC 750MG     Na'Shaminy T [facility]

## 2023-07-05 NOTE — Hospital Course (Addendum)
PMH of CAD s/p CABG, combined systolic and diastolic CHF, ESRD MWF, type 2 diabetes, hypertension, SVT, OSA, upper GI bleed who presented with slurred speech and AMS.  Pt moved into SNF about 9 months ago after a hospitalization. He was living along prior to that. He fractured his right hip last April and had declined and is now mostly bedbound and wheelchair bound. Caretaker last saw him well on 1/9. He was diagnosed with right sided pneumonia on 1/7.    Presented as a code stroke, CTA multiple stenosis.  MRI confirmed multiple scattered acute strokes.  Neurology was consulted.    For ESRD nephrology was consulted for HD.  ID was consulted for possible staph bacteremia. As the patient has poor mentation and continues to decline further, palliative care was consulted and family is leaning towards comfort.  Currently DNR. Mentation appears to be improving although patient still significantly fatigued.  Assessment and Plan: Acute ischemic bilateral hemispheres punctate infarct. CT head negative. CTA shows evidence of multiple stenosis. CT perfusion scan no penumbra. MRI brain confirmed multiple foci of scattered acute infarct in the bilateral cerebral hemisphere. Echocardiogram shows EF 45 to 50%.  LDL 24.  Hemoglobin A1c 8.1. Not on any antithrombotic therapy prior to admission Later on recommendation was aspirin and Plavix for 3 weeks followed by aspirin alone. Neuro signed off on 1/16. Mentation better since 1/23.  Dysphagia. Speech therapy following. Secondary to stroke. On D2 diet thin liquid. Oral intake is 15 to 45%.  ESRD on HD.  MWF. Nephrology following. Will monitor progression.  So far appears to be tolerating HD.  MRSA bacteremia. Concern for CNS embolic process in the setting of presentation with acute stroke as well as bacteremia. TEE was requested but since the family is leaning towards more comfort approach, currently canceled. Continue with IV antibiotics with  dialysis. Appreciate ID consultation.  Acute encephalopathy, associated with a stroke as well as infection. Multifactorial. Less likely toxic. Most likely metabolic. Improving.  Monitor.  Anemia of chronic kidney disease. H&H stable. Receiving iron aspirin.  Chronic systolic and diastolic CHF. HTN. History of SVT. CAD SP CABG, cath 8/23 patent grafts. Echo in the past had EF of 35 to 40%. Echo this admission improved 45 to 50%. Clinically euvolemic.  Volume management with HD. Currently on Imdur and Toprol-XL. Also on amiodarone. On aspirin and Plavix. Blood pressure has been soft.  Will hold Imdur and reduce Toprol-XL to 12.5 mg.  OSA. On nighttime O2. Not on CPAP. Monitor.  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 Currently on sliding scale insulin.  Hypothyroidism. On Synthroid. TSH 11.6 on admission.  Neuropathy. On gabapentin.  Currently on hold.  Chronic pain syndrome. Patient is currently not on fentanyl patch. Currently on fentanyl as needed only. Has not received narcotics since 1/20.  Mood disorder. On Zoloft. Continue  GERD. On PPI twice daily.

## 2023-07-05 NOTE — Plan of Care (Signed)
  Problem: Education: Goal: Knowledge of patient specific risk factors will improve [name] N/A or DELETE if not current risk factor) Outcome: Progressing   Problem: Ischemic Stroke/TIA Tissue Perfusion: Goal: Complications of ischemic stroke/TIA will be minimized Outcome: Progressing   Problem: Coping: Goal: Will verbalize positive feelings about self Outcome: Progressing   Problem: Coping: Goal: Will identify appropriate support needs Outcome: Progressing

## 2023-07-05 NOTE — Progress Notes (Signed)
Speech Language Pathology Treatment: Dysphagia  Patient Details Name: [name] MRN: [id number] DOB: 1956-03-16 Today's Date: 07/05/2023 Time: 7829-5621 SLP Time Calculation (min) (ACUTE ONLY): 14 min  Assessment / Plan / Recommendation Clinical Impression  Pt lethargic.  Nursing student fed pt breakfast, reporting adequate attention/interest, needing extra time and total assist.  He coughed occasionally when drinking from the straw. Later in the am he was too lethargic to successfully take his meds.  During our visit, he remained lethargic, opening his eyes to name, following simple commands intermittently. He accepted sips of water and bites of applesauce, but response time to swallow was extremely delayed and ultimately suctioning was used to remove standing POs from his oral cavity.    His ability to meet nutritional needs is really compromised. Swallowing/eating is waxing and waning according to his mental status at any given moment.  Recommend crushing meds when able; continue to try to use straws - hold if they invite coughing.  SLP to follow.    HPI HPI: Pt is a 68 yo male presenting with slurred speech and AMS. MRI revealed multiple scattered foci of acute infarct in the bilateral cerebral hemispheres. Pt has been seen multiple times by SLP in the past with dysphagia suspected to be primarily of esophageal origin. He has been on pureed diets up to mechanical soft diets, always with thin liquids. PMH includes: recent PNA (dx 1/7), stroke (2022), esophageal ca (2022), HH, CAD s/p CABG, combined CHF, ESRD, DMII, HTN, SVT, OSA, UGIB. Pt bed and w/c bound since hip fx last April and now resides at [facility].      SLP Plan  Continue with current plan of care      Recommendations for follow up therapy are one component of a multi-disciplinary discharge planning process, led by the attending physician.  Recommendations may be updated based on patient status, additional functional criteria and  insurance authorization.    Recommendations  Diet recommendations: Dysphagia 2 (fine chop);Thin liquid Liquids provided via: Cup;Straw Medication Administration: Crushed with puree Supervision: Trained caregiver to feed patient Compensations: Minimize environmental distractions;Slow rate;Small sips/bites Postural Changes and/or Swallow Maneuvers: Seated upright 90 degrees                  Oral care BID   Frequent or constant Supervision/Assistance Dysphagia, unspecified (R13.10)     Continue with current plan of care    Vanya Carberry L. [name], MA CCC/SLP Clinical Specialist - Acute Care SLP Acute Rehabilitation Services Office number [phone]  [name]  07/05/2023, 11:57 AM

## 2023-07-05 NOTE — Progress Notes (Signed)
Palliative:  HPI:  68 y.o. male  with past medical history of stroke, CAD s/p CABG, HFmrEF, ESRD on HD MWF, diabetes, HTN, SVT, OSA, upper GIB, esophageal cancer 2022, right eye vision loss, s/p hip fracture s/p repair admitted on 06/27/2023 with slurred speech and AMS due to stroke, uremia (missed HD), and new stroke.    I met today with [name] and he is a little more alert and tracking. He does answer with one word answers but difficult to maintain attention. He does not follow many commands. He does seem to enjoy watching the television. I spoke with RN and nursing student - he ate a few bites of breakfast ~10% but no interest in further but denied pocketing or difficulties. Later he was noted to be pocketed and unable to get all medications in.   I called and discussed with HCPOA/sister, [name]. I discussed with her the above. [name] is more awake and a little more verbal. He also ate very little. However, this is not enough improvement that he will be able to thrive. We reviewed his overall decline from baseline, unlikelihood he will maintain nutrition/hydration, and fluctuating mental state. We discussed the ease of HD in the hospital vs outpatient. [name] and I review lack of desire in eating vs not offering food/drink. I reassured her that even with full comfort care he would be offered food/drink as this is a sign of comfort and enjoyment - although acknowledging there would likely be more times he may be too lethargic to even offer. We discussed overall poor prognosis for improvement and poor quality of life. We agreed that a feeding tube is not in his wishes as he has consistently expressed this multiple times. We discussed plan to pursue HD/antibiotic treatment but if not significant improvement consideration of full comfort care and hospice facility placement. [name] agrees with plan but struggling with the timing of transition and ensuring [name] has had every chance of improvement. We discussed his poor status  even at his best over the past week. [name] understands and welcomes ongoing support and conversations.   All questions/concerns addressed. Emotional support provided.   Exam: More alert. Tracks better and responses with one word verbal response. Poor attention and concentration. Not following commands consistently.   Plan: - DNR/DNI, no feeding tube - Consideration of transition to full comfort care and hospice if no significant improvement over the next couple days  45 min  Yong Channel, NP Palliative Medicine Team Pager [phone] (Please see [email or web address] for schedule) Team Phone [phone]

## 2023-07-05 NOTE — Progress Notes (Signed)
[facility] Progress Note   Subjective:  Seen in room. A little more responsive today. Says hi, asks what time for dialysis.   Objective Vitals:   07/04/23 2337 07/05/23 0324 07/05/23 0728 07/05/23 1041  BP: (!) 158/68 (!) 159/77 (!) 166/75 (!) 155/70  Pulse: 74 76 73 75  Resp: 18 18    Temp: 98 F (36.7 C) 98.3 F (36.8 C) 97.8 F (36.6 C)   TempSrc: Oral Oral Axillary   SpO2: 98% 97% 98%   Weight:      Height:         Additional Objective Labs: Basic Metabolic Panel: Recent Labs  Lab 06/28/23 1814 06/30/23 1556  NA 136 137  K 4.0 3.5  CL 97* 98  CO2 20* 23  GLUCOSE 89 72  BUN 47* 33*  CREATININE 6.30* 5.03*  CALCIUM 8.4* 8.2*  PHOS 4.6 4.3   CBC: Recent Labs  Lab 06/28/23 1814 06/30/23 1556  WBC 8.0 7.0  HGB 9.8* 10.2*  HCT 32.4* 33.7*  MCV 81.2 81.4  PLT 235 247   Blood Culture    Component Value Date/Time   SDES BLOOD LEFT HAND 07/02/2023 1046   SPECREQUEST  07/02/2023 1046    BOTTLES DRAWN AEROBIC AND ANAEROBIC Blood Culture adequate volume   CULT  07/02/2023 1046    NO GROWTH 3 DAYS Performed at [facility], [address], [place], [place] [address]    REPTSTATUS PENDING 07/02/2023 1046     Physical Exam General: Awake, lethargic, nad  Heart: RRR Lungs: Clear  Abdomen: soft -non-tender Extremities: No LE edema  Dialysis Access: L AVF +bruit   Medications:  vancomycin Stopped (07/03/23 1811)     stroke: early stages of recovery book   Does not apply Once   amiodarone  200 mg Oral BID   aspirin EC  81 mg Oral Daily   calcium acetate  667 mg Oral 3 times per day on Sunday Tuesday Thursday Saturday   And   calcium acetate  667 mg Oral 2 times per day on Monday Wednesday Friday   Chlorhexidine Gluconate Cloth  6 each Topical Q0600   clopidogrel  75 mg Oral Daily   darbepoetin (ARANESP) injection - DIALYSIS  100 mcg Subcutaneous Q Mon-1800   feeding supplement  237 mL Oral BID BM   heparin  5,000 Units  Subcutaneous Q8H   isosorbide mononitrate  30 mg Oral Daily   levothyroxine  50 mcg Oral Daily   metoprolol succinate  50 mg Oral Daily   mupirocin ointment   Nasal BID   mouth rinse  15 mL Mouth Rinse 4 times per day   pantoprazole  40 mg Oral BID   sertraline  100 mg Oral Daily   sodium chloride flush  3 mL Intravenous Once    Dialysis Orders:  [facility] MWF 4:00 400/600 EDW 75.3kg 2K/2.5Ca AVF Hep 5000 Last HD 1/10.  Mircera 200 q 2wks (last 06/16/23) Hectorol 1mg  TIW   Assessment/Plan: Acute CVA - multiple scattered infarcts on MRI. W/u in progress. Per neurology  Acute encephalopathy with aphasia- likely multifactorial, not improved MRSA bacteremia on BCx 1/14. Repeat cultures neg to date.  On IV Vanc. ID following. Plans for TEE ESRD -  HD MWF. Cont on schedule using AVF. Next HD 1/22.  Hypertension/volume  - Permissive HTN per neuro. No gross volume overload  Anemia  - Hgb 9.8. Aranesp 100 q Monday. Last on  1/20.  Metabolic bone disease -  Calcium and P acceptable. Continue home meds when tolerating po  H/o SVT - on amiodarone  T2DM - per primary  GOC - Palliative care consulted - Code status now DNR/DNI. Ongoing GOC discussions.   Tomasa Blase PA-C [facility] 07/05/2023,10:59 AM

## 2023-07-05 NOTE — Plan of Care (Signed)
  Problem: Metabolic: Goal: Ability to maintain appropriate glucose levels will improve Outcome: Progressing   Problem: Skin Integrity: Goal: Risk for impaired skin integrity will decrease Outcome: Progressing   Problem: Clinical Measurements: Goal: Respiratory complications will improve Outcome: Progressing   Problem: Nutritional: Goal: Maintenance of adequate nutrition will improve Outcome: Not Progressing Note: Decreased oral intake.

## 2023-07-05 NOTE — Progress Notes (Addendum)
Patient was unable to take morning meds due to swallowing issues. 5 medications wasted in stericycle.  Witnessed by Foye Clock, Rn

## 2023-07-05 NOTE — Progress Notes (Signed)
PT Cancellation Note  Patient Details Name: [name] MRN: [id number] DOB: 1955-08-28   Cancelled Treatment:    Reason Eval/Treat Not Completed: (P) Patient at procedure or test/unavailable, pt off unit at HD. Will check back as schedule allows to continue with PT POC.  Lenora Boys. PTA Acute Rehabilitation Services Office: [phone]    Catalina Antigua 07/05/2023, 1:15 PM

## 2023-07-06 DIAGNOSIS — N186 End stage renal disease: Secondary | ICD-10-CM | POA: Diagnosis not present

## 2023-07-06 DIAGNOSIS — Z7189 Other specified counseling: Secondary | ICD-10-CM

## 2023-07-06 DIAGNOSIS — I639 Cerebral infarction, unspecified: Secondary | ICD-10-CM | POA: Diagnosis not present

## 2023-07-06 DIAGNOSIS — R627 Adult failure to thrive: Secondary | ICD-10-CM | POA: Diagnosis not present

## 2023-07-06 DIAGNOSIS — Z66 Do not resuscitate: Secondary | ICD-10-CM

## 2023-07-06 DIAGNOSIS — Z515 Encounter for palliative care: Secondary | ICD-10-CM

## 2023-07-06 LAB — GLUCOSE, CAPILLARY
Glucose-Capillary: 104 mg/dL — ABNORMAL HIGH (ref 70–99)
Glucose-Capillary: 121 mg/dL — ABNORMAL HIGH (ref 70–99)
Glucose-Capillary: 130 mg/dL — ABNORMAL HIGH (ref 70–99)
Glucose-Capillary: 177 mg/dL — ABNORMAL HIGH (ref 70–99)

## 2023-07-06 NOTE — Progress Notes (Signed)
[facility] Progress Note Patient: [name] [id number] DOB: August 19, 1955 DOA: 06/27/2023  DOS: the patient was seen and examined on 07/06/2023  Brief Hospital Course: PMH of CAD s/p CABG, combined systolic and diastolic CHF, ESRD MWF, type 2 diabetes, hypertension, SVT, OSA, upper GI bleed who presented with slurred speech and AMS.  Pt moved into SNF about 9 months ago after a hospitalization. He was living along prior to that. He fractured his right hip last April and had declined and is now mostly bedbound and wheelchair bound. Caretaker last saw him well on 1/9. He was diagnosed with right sided pneumonia on 1/7. Last dialysis session was Thursday 1/9. Normally schedule is MWF but he reportedly refused it a day prior which is unlike him.   Presented as a code stroke, CTA multiple stenosis.  MRI confirmed multiple scattered acute strokes.  Neurology was consulted.   For ESRD nephrology was consulted for HD. ID was consulted for possible staph bacteremia. As the patient has poor mentation and continues to decline further, palliative care was consulted and family is leaning towards comfort.  Currently DNR. Assessment and Plan: Acute ischemic bilateral hemispheres punctate infarct. CT head negative. CTA shows evidence of multiple stenosis. CT perfusion scan no penumbra. MRI brain confirmed multiple foci of scattered acute infarct in the bilateral cerebral hemisphere. Echocardiogram shows EF 45 to 50%.  LDL 24.  Hemoglobin A1c 8.1. Not on any antithrombotic therapy prior to admission Later on recommendation was aspirin and Plavix for 3 weeks followed by aspirin alone. Neuro signed off on 1/16. Mentation somewhat better on 1/23.  Will continue to monitor.  Dysphagia. Speech therapy following. Secondary to stroke. On D2 diet thin liquid. Oral intake is minimal.  ESRD on HD.  MWF. Nephrology following. Plan is to follow-up on patient's progression after hemodialysis.  MRSA  bacteremia. Concern for CNS embolic process in the setting of presentation with acute stroke as well as bacteremia. TEE was requested but family currently leaning towards more comfort approach. Continue with IV antibiotics with dialysis. Appreciate ID consultation.  Acute encephalopathy, associated with a stroke as well as infection. Multifactorial. Less likely toxic. Most likely metabolic. Monitor.  Anemia of chronic kidney disease. H&H stable. Receiving iron aspirin.  Chronic systolic and diastolic CHF. HTN. History of SVT. Echo in the past had EF of 35 to 40%. Echo at this time around improved 45 to 50%. Clinically euvolemic.  Volume management with HD. Blood pressure improving. Currently on Imdur and Toprol-XL. Also on amiodarone.  Monitor.  CAD SP CABG. On aspirin and Plavix.  OSA. On nighttime O2. Not on CPAP. Monitor.  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 Currently on sliding scale insulin.  Hypothyroidism. On Synthroid.  Neuropathy. Obtained.  Currently on hold.  Chronic pain syndrome. On fentanyl patch. Dose increased to 50 mcg per palliative care.  Mood disorder. On Zoloft. Continue   Subjective: Able to answer questions.  Able to follow commands.  No nausea no vomiting.  Physical Exam: General: in Mild distress, No Rash Cardiovascular: S1 and S2 Present, No Murmur Respiratory: Good respiratory effort, Bilateral Air entry present. No Crackles, No wheezes Abdomen: Bowel Sound present, No tenderness Extremities: No edema Neuro: Alert and oriented self, has dysarthria as well as aphasia.  Left-sided weakness.  Data Reviewed: I have Reviewed nursing notes, Vitals, and Lab results. I have ordered test including CBC and BMP  .   Disposition: Status is: Inpatient Remains inpatient  appropriate because: Monitor for improvement  heparin injection 5,000 Units Start:  06/27/23 2200   Family Communication: None at bedside Level of care: Telemetry Medical   Vitals:   07/06/23 0438 07/06/23 0749 07/06/23 1132 07/06/23 1617  BP: (!) 158/61 (!) 143/62 (!) 134/58 (!) 124/55  Pulse: 73 76 77 80  Resp: 14 18 18 17   Temp: 97.7 F (36.5 C) 97.7 F (36.5 C) 97.9 F (36.6 C) 97.7 F (36.5 C)  TempSrc: Axillary Oral Oral Oral  SpO2: 95% 98% 99% 94%  Weight:      Height:         Author: Lynden Oxford, MD 07/06/2023 6:44 PM  Please look on [email or web address] to find out who is on call.

## 2023-07-06 NOTE — Progress Notes (Signed)
[facility] Progress Note   Subjective:  Completed dialysis yesterday net UF 1.6L  Seen in room. Awake, responds to questions Palliative care following for GOC   Objective Vitals:   07/05/23 2329 07/06/23 0438 07/06/23 0749 07/06/23 1132  BP: (!) 143/66 (!) 158/61 (!) 143/62 (!) 134/58  Pulse: 74 73 76 77  Resp: 16 14 18 18   Temp: 97.6 F (36.4 C) 97.7 F (36.5 C) 97.7 F (36.5 C) 97.9 F (36.6 C)  TempSrc: Axillary Axillary Oral Oral  SpO2: 97% 95% 98% 99%  Weight:      Height:         Additional Objective Labs: Basic Metabolic Panel: Recent Labs  Lab 06/30/23 1556 07/05/23 1119  NA 137 138  K 3.5 3.9  CL 98 96*  CO2 23 22  GLUCOSE 72 146*  BUN 33* 33*  CREATININE 5.03* 5.08*  CALCIUM 8.2* 8.9  PHOS 4.3 4.5   CBC: Recent Labs  Lab 06/30/23 1556 07/05/23 1119  WBC 7.0 7.6  NEUTROABS  --  5.7  HGB 10.2* 12.5*  HCT 33.7* 41.4  MCV 81.4 81.2  PLT 247 304   Blood Culture    Component Value Date/Time   SDES BLOOD LEFT HAND 07/02/2023 1046   SPECREQUEST  07/02/2023 1046    BOTTLES DRAWN AEROBIC AND ANAEROBIC Blood Culture adequate volume   CULT  07/02/2023 1046    NO GROWTH 3 DAYS Performed at [facility], [address], [place], [place] [address]    REPTSTATUS PENDING 07/02/2023 1046     Physical Exam General: Awake, lethargic, nad  Heart: RRR Lungs: Clear  Abdomen: soft -non-tender Extremities: No LE edema  Dialysis Access: L AVF +bruit   Medications:  vancomycin Stopped (07/05/23 1537)     stroke: early stages of recovery book   Does not apply Once   amiodarone  200 mg Oral BID   aspirin EC  81 mg Oral Daily   calcium acetate  667 mg Oral 3 times per day on Sunday Tuesday Thursday Saturday   And   calcium acetate  667 mg Oral 2 times per day on Monday Wednesday Friday   Chlorhexidine Gluconate Cloth  6 each Topical Q0600   clopidogrel  75 mg Oral Daily   darbepoetin (ARANESP) injection - DIALYSIS  100 mcg  Subcutaneous Q Mon-1800   feeding supplement  237 mL Oral BID BM   heparin  5,000 Units Subcutaneous Q8H   isosorbide mononitrate  30 mg Oral Daily   levothyroxine  50 mcg Oral Daily   metoprolol succinate  50 mg Oral Daily   mupirocin ointment   Nasal BID   mouth rinse  15 mL Mouth Rinse 4 times per day   pantoprazole  40 mg Oral BID   sertraline  100 mg Oral Daily   sodium chloride flush  3 mL Intravenous Once    Dialysis Orders:  [facility] MWF 4:00 400/600 EDW 75.3kg 2K/2.5Ca AVF Hep 5000 Last HD 1/10.  Mircera 200 q 2wks (last 06/16/23) Hectorol 1mg  TIW   Assessment/Plan: Acute CVA - multiple scattered infarcts on MRI.  Per neurology  Acute encephalopathy with aphasia- likely multifactorial, not improved MRSA bacteremia on BCx 1/14. Repeat cultures neg to date.  On IV Vanc. ID following.  ESRD -  HD MWF. Cont on schedule using AVF. Next HD 1/24  Hypertension/volume  -  BP ok. No volume on exam. Gentle UF  Anemia  - Hgb improved 12.5.   Aranesp  100 on  1/20. - Hold ESA for now  Metabolic bone disease -  Calcium and P acceptable. Continue home meds when tolerating po  H/o SVT - on amiodarone  T2DM - per primary  GOC - Palliative care consulted - Code status now DNR/DNI. Ongoing GOC discussions.   Tomasa Blase PA-C [facility] 07/06/2023,11:42 AM

## 2023-07-06 NOTE — Plan of Care (Signed)
  Problem: Ischemic Stroke/TIA Tissue Perfusion: Goal: Complications of ischemic stroke/TIA will be minimized Outcome: Not Progressing   Problem: Health Behavior/Discharge Planning: Goal: Ability to manage health-related needs will improve Outcome: Not Progressing Goal: Goals will be collaboratively established with patient/family Outcome: Not Progressing   Problem: Nutrition: Goal: Risk of aspiration will decrease Outcome: Not Progressing Goal: Dietary intake will improve Outcome: Not Progressing   Problem: Skin Integrity: Goal: Risk for impaired skin integrity will decrease Outcome: Not Progressing

## 2023-07-06 NOTE — Progress Notes (Signed)
Patient told this nurse "Iced water",, prepared water for patient, tolerated 50 ml of honey thick iced water, facial and oral care performed after.

## 2023-07-06 NOTE — Progress Notes (Signed)
Patient asked how he was doing and he stated, "I am good", asked if he wants to drink anything and responded "yes, Im thirsty", asked what he wants to drink and he said orange juice. Able to take 50 mls of of thickened orange juice before telling this nurse that he is good. Left in sitting position for now to prevent aspiration.

## 2023-07-06 NOTE — Progress Notes (Addendum)
Patient agreed to take apple sauce with medications but the moment the spoon was in his mouth, he doesn't follow instructions to swallow it and just kept his mouth open, this nurse removed the apple sauce and did oral care to patient instead and suctioned his mouth.

## 2023-07-06 NOTE — Progress Notes (Addendum)
Physical Therapy Treatment and Discharge Patient Details Name: [name] MRN: [id number] DOB: 09/05/1955 Today's Date: 07/06/2023   History of Present Illness [name] is a 68 yo male presenting with stroke like symptoms, slurred, speech, and altered mental status. MRI revealed multiple scattered foci of acute infarct in the bilateral cerebral hemispheres, no hemorrhage or mass effect. PMHx TIA,CAD, cancer, ESRD on hemodialysis, heart failure, MI, diabetes, sleep apnea, R hip ORIF 08/2022    PT Comments  Patient briefly opens eyes to name and when asked to follow commands. Otherwise eyes closed. Noted OT spoke with pt's sister and pt was using a hoyer lift PTA. Patient with limited AAROM RUE, RLE, LLE (none noted with ROM of LUE). Patient being discharged from PT due to inability to make progress with anticipated long-term care (vs Hospice) discharge plan.     If plan is discharge home, recommend the following: Two people to help with walking and/or transfers;Assistance with feeding;Direct supervision/assist for medications management;Direct supervision/assist for financial management;Assist for transportation;Supervision due to cognitive status;Help with stairs or ramp for entrance   Can travel by private vehicle     No  Equipment Recommendations  Hoyer lift;Hospital bed    Recommendations for Other Services       Precautions / Restrictions Precautions Precautions: Fall Restrictions Weight Bearing Restrictions Per Provider Order: No     Mobility  Bed Mobility                    Transfers                   General transfer comment: TOTAL A for transfers. Uses hoyer at [facility]    Ambulation/Gait                   Stairs             Wheelchair Mobility     Tilt Bed    Modified Rankin (Stroke Patients Only) Modified Rankin (Stroke Patients Only) Pre-Morbid Rankin Score: Severe disability Modified Rankin: Severe disability      Balance                                            Cognition Arousal: Lethargic Behavior During Therapy: Flat affect Overall Cognitive Status: Impaired/Different from baseline Area of Impairment: Following commands, Awareness, Attention (Pt did not follow one-step verbal commands)                   Current Attention Level: Focused   Following Commands: Follows one step commands inconsistently       General Comments: pt briefly arouses, states "yes" when asked if he wantts another blanket        Exercises Other Exercises Other Exercises: PROM to all extremities    General Comments        Pertinent Vitals/Pain Pain Assessment Pain Assessment: Faces Faces Pain Scale: Hurts little more Pain Location: RLE Pain Descriptors / Indicators: Grimacing (with PROM of RLE) Pain Intervention(s): Limited activity within patient's tolerance, Monitored during session, Repositioned    Home Living                          Prior Function            PT Goals (current goals can now be found in the care  plan section) Acute Rehab PT Goals Patient Stated Goal: unable to state PT Goal Formulation: Patient unable to participate in goal setting Time For Goal Achievement: 07/13/23 Potential to Achieve Goals: Poor Progress towards PT goals: Not progressing toward goals - comment (lethargic;)    Frequency    Min 1X/week      PT Plan      Co-evaluation              AM-PAC PT "6 Clicks" Mobility   Outcome Measure  Help needed turning from your back to your side while in a flat bed without using bedrails?: Total Help needed moving from lying on your back to sitting on the side of a flat bed without using bedrails?: Total Help needed moving to and from a bed to a chair (including a wheelchair)?: Total Help needed standing up from a chair using your arms (e.g., wheelchair or bedside chair)?: Total Help needed to walk in hospital room?:  Total Help needed climbing 3-5 steps with a railing? : Total 6 Click Score: 6    End of Session   Activity Tolerance: Patient limited by lethargy Patient left: in bed;with call bell/phone within reach;with bed alarm set   PT Visit Diagnosis: Hemiplegia and hemiparesis Hemiplegia - Right/Left:  (bil) Hemiplegia - caused by: Cerebral infarction     Time: 1443-1455 PT Time Calculation (min) (ACUTE ONLY): 12 min  Charges:    $Therapeutic Exercise: 8-22 mins PT General Charges $$ ACUTE PT VISIT: 1 Visit                      Jerolyn Center, PT Acute Rehabilitation Services  Office [phone]    Zena Amos 07/06/2023, 3:05 PM

## 2023-07-06 NOTE — Progress Notes (Signed)
Palliative:  HPI:  68 y.o. male  with past medical history of stroke, CAD s/p CABG, HFmrEF, ESRD on HD MWF, diabetes, HTN, SVT, OSA, upper GIB, esophageal cancer 2022, right eye vision loss, s/p hip fracture s/p repair admitted on 06/27/2023 with slurred speech and AMS due to stroke, uremia (missed HD), and new stroke.    I met today with Moreland - he appears drowsy but does respond to some questions. When I ask him how he feels he tells me "not bad" but when I ask other questions he does not respond. At one point he asks for "cracker". Applesauce at bedside and I offered - he took one small bite. Provided small sip of juice at bedside. After this, he declined any more intake. Checked in with RN who shares he had no intake for her - they attempted feeding but it all sat in his mouth, no swallowing. Asked Leim if he had any questions or needs I could address but he did not reply. Told him I would call and talk to Amy and he nodded.   I called and discussed with HCPOA/sister, Amy. Amy is a physician and has a good understand of situation.  I discussed with her the above. We discussed concerns about ongoing poor PO intake - discussed concern that Kaan will not be able to sustain himself with current intake and he has been clear now and in the past he would not want a feeding tube. Amy wants to honor this. We discuss concern that even if Shurman were accepted at a rehab facility its very unlikely he would thrive there and would likely be back in the hospital again soon and the family would once again face difficult decisions. Amy is aware of all of this. She is struggling since Stout does seem to have some good days/some bad days - though his intake remains poor throughout most days. Hopeful Damian would "declare himself" one way or the other to help clarify decision making.  Amy shares she is one of five siblings and continues to want to include her siblings in decision making for [place]. She plans to have conversation  with them tonight about possible shifting to more of a comfort approach for Destine but she is unsure how this will go. Offered to Amy PMT can assist with those conversations in the future if she feels that would be helpful - she is open to this and will let us know if it would be helpful to hold conference call with other family. We will continue to follow along and Amy has our number - she will reach out to [place] as well as needed. Plan to continue current measures for now as Amy has some time to discuss situation with siblings. Amy understands and welcomes ongoing support and conversations.   All questions/concerns addressed. Emotional support provided.   Exam: Eyes open -  Tracks and occasionally responds to questions with short replies. Poor attention and concentration. Not following commands consistently.   Plan: - DNR/DNI, no feeding tube - Consideration of transition to full comfort care and hospice if no significant improvement over the next couple days - Amy planning to have conversation with other siblings tonight and follow up with PMT soon  35 min  Detailed review of medical records, medically appropriate exam, discussed with treatment team, counseling and education to patient, family, staff, documenting clinical information, medication management, coordination of care   Latifah Padin Andi Devon, DNP, [facility] Palliative Medicine Team Team Phone # [phone]  Pager # [phone]

## 2023-07-07 DIAGNOSIS — N186 End stage renal disease: Secondary | ICD-10-CM | POA: Diagnosis not present

## 2023-07-07 DIAGNOSIS — Z7189 Other specified counseling: Secondary | ICD-10-CM | POA: Diagnosis not present

## 2023-07-07 DIAGNOSIS — R627 Adult failure to thrive: Secondary | ICD-10-CM | POA: Diagnosis not present

## 2023-07-07 DIAGNOSIS — I639 Cerebral infarction, unspecified: Secondary | ICD-10-CM | POA: Diagnosis not present

## 2023-07-07 LAB — RENAL FUNCTION PANEL
Albumin: 2.8 g/dL — ABNORMAL LOW (ref 3.5–5.0)
Anion gap: 15 (ref 5–15)
BUN: 30 mg/dL — ABNORMAL HIGH (ref 8–23)
CO2: 26 mmol/L (ref 22–32)
Calcium: 9 mg/dL (ref 8.9–10.3)
Chloride: 94 mmol/L — ABNORMAL LOW (ref 98–111)
Creatinine, Ser: 4.86 mg/dL — ABNORMAL HIGH (ref 0.61–1.24)
GFR, Estimated: 12 mL/min — ABNORMAL LOW (ref >60)
Glucose, Bld: 138 mg/dL — ABNORMAL HIGH (ref 70–99)
Phosphorus: 3.7 mg/dL (ref 2.5–4.6)
Potassium: 3.5 mmol/L (ref 3.5–5.1)
Sodium: 135 mmol/L (ref 135–145)

## 2023-07-07 LAB — CBC
HCT: 39.6 % (ref 39.0–52.0)
Hemoglobin: 11.9 g/dL — ABNORMAL LOW (ref 13.0–17.0)
MCH: 24.3 pg — ABNORMAL LOW (ref 26.0–34.0)
MCHC: 30.1 g/dL (ref 30.0–36.0)
MCV: 80.8 fL (ref 80.0–100.0)
Platelets: 342 10*3/uL (ref 150–400)
RBC: 4.9 MIL/uL (ref 4.22–5.81)
RDW: 20.3 % — ABNORMAL HIGH (ref 11.5–15.5)
WBC: 9 10*3/uL (ref 4.0–10.5)
nRBC: 0 % (ref 0.0–0.2)

## 2023-07-07 LAB — GLUCOSE, CAPILLARY
Glucose-Capillary: 129 mg/dL — ABNORMAL HIGH (ref 70–99)
Glucose-Capillary: 142 mg/dL — ABNORMAL HIGH (ref 70–99)
Glucose-Capillary: 133 mg/dL — ABNORMAL HIGH (ref 70–99)
Glucose-Capillary: 187 mg/dL — ABNORMAL HIGH (ref 70–99)

## 2023-07-07 LAB — CULTURE, BLOOD (ROUTINE X 2)
Culture: NO GROWTH
Culture: NO GROWTH
Special Requests: ADEQUATE
Special Requests: ADEQUATE

## 2023-07-07 MED ORDER — LIDOCAINE-PRILOCAINE 2.5-2.5 % EX CREA
1.0000 | TOPICAL_CREAM | CUTANEOUS | Status: DC | PRN
Start: 2023-07-07 — End: 2023-07-07

## 2023-07-07 MED ORDER — HEPARIN SODIUM (PORCINE) 1000 UNIT/ML DIALYSIS
20.0000 [IU]/kg | INTRAMUSCULAR | Status: DC | PRN
  Administered 2023-07-07: 1400 [IU] via INTRAVENOUS_CENTRAL
  Filled 2023-07-07 (×2): qty 2

## 2023-07-07 MED ORDER — LIDOCAINE HCL (PF) 1 % IJ SOLN: 5.0000 mL | INTRAMUSCULAR | Status: DC | PRN

## 2023-07-07 MED ORDER — ALTEPLASE 2 MG IJ SOLR: 2.0000 mg | Freq: Once | INTRAMUSCULAR | Status: DC | PRN

## 2023-07-07 MED ORDER — HEPARIN SODIUM (PORCINE) 1000 UNIT/ML DIALYSIS: 1000.0000 [IU] | INTRAMUSCULAR | Status: DC | PRN

## 2023-07-07 MED ORDER — ANTICOAGULANT SODIUM CITRATE 4% (200MG/5ML) IV SOLN: 5.0000 mL | Status: DC | PRN

## 2023-07-07 MED ORDER — PENTAFLUOROPROP-TETRAFLUOROETH EX AERO: 1.0000 | INHALATION_SPRAY | CUTANEOUS | Status: DC | PRN

## 2023-07-07 NOTE — Progress Notes (Signed)
Patient able to drink 100 ml of thickened water via spoon, when asked by this nurse if he wants to be repositioned after, he said "I am fine for now".

## 2023-07-07 NOTE — Progress Notes (Signed)
[facility] Progress Note   Subjective:   Seen on HD, denies any complaints today. Denies SOB, CP, dizziness, nausea.   Objective Vitals:   07/07/23 0408 07/07/23 0819 07/07/23 0830 07/07/23 0900  BP: (!) 152/69 (!) 160/75 (!) 153/71 (!) 142/76  Pulse: 78 82 76 79  Resp: 18 17 16 17   Temp: 98.6 F (37 C) 98.5 F (36.9 C)    TempSrc: Oral Oral    SpO2: 95% 96% 92% 95%  Weight:      Height:       Physical Exam General: Alert,  chronically ill appearing male in NAD Heart: RRR, no murmurs, rubs or gallops Lungs: CTA anteriorly, respirations unlabored on RA Abdomen: Soft, non-distended, +BS Extremities: No edema b/l lower extremities Dialysis Access:  LUE AVF accessed  Additional Objective Labs: Basic Metabolic Panel: Recent Labs  Lab 06/30/23 1556 07/05/23 1119  NA 137 138  K 3.5 3.9  CL 98 96*  CO2 23 22  GLUCOSE 72 146*  BUN 33* 33*  CREATININE 5.03* 5.08*  CALCIUM 8.2* 8.9  PHOS 4.3 4.5   Liver Function Tests: Recent Labs  Lab 06/30/23 1556 07/05/23 1119  ALBUMIN 2.5* 2.9*   No results for input(s): "LIPASE", "AMYLASE" in the last 168 hours. CBC: Recent Labs  Lab 06/30/23 1556 07/05/23 1119  WBC 7.0 7.6  NEUTROABS  --  5.7  HGB 10.2* 12.5*  HCT 33.7* 41.4  MCV 81.4 81.2  PLT 247 304   Blood Culture    Component Value Date/Time   SDES BLOOD LEFT HAND 07/02/2023 1046   SPECREQUEST  07/02/2023 1046    BOTTLES DRAWN AEROBIC AND ANAEROBIC Blood Culture adequate volume   CULT  07/02/2023 1046    NO GROWTH 4 DAYS Performed at [facility], [address], [place], [place] [address]    REPTSTATUS PENDING 07/02/2023 1046    Cardiac Enzymes: No results for input(s): "CKTOTAL", "CKMB", "CKMBINDEX", "TROPONINI" in the last 168 hours. CBG: Recent Labs  Lab 07/06/23 0607 07/06/23 1156 07/06/23 1902 07/07/23 0108 07/07/23 0614  GLUCAP 104* 130* 177* 129* 142*   Iron Studies: No results for input(s): "IRON", "TIBC",  "TRANSFERRIN", "FERRITIN" in the last 72 hours. @lablastinr3 @ Studies/Results: No results found. Medications:  anticoagulant sodium citrate     vancomycin Stopped (07/05/23 1537)     stroke: early stages of recovery book   Does not apply Once   amiodarone  200 mg Oral BID   aspirin EC  81 mg Oral Daily   calcium acetate  667 mg Oral 3 times per day on Sunday Tuesday Thursday Saturday   And   calcium acetate  667 mg Oral 2 times per day on Monday Wednesday Friday   Chlorhexidine Gluconate Cloth  6 each Topical Q0600   clopidogrel  75 mg Oral Daily   feeding supplement  237 mL Oral BID BM   heparin  5,000 Units Subcutaneous Q8H   isosorbide mononitrate  30 mg Oral Daily   levothyroxine  50 mcg Oral Daily   metoprolol succinate  50 mg Oral Daily   mupirocin ointment   Nasal BID   mouth rinse  15 mL Mouth Rinse 4 times per day   pantoprazole  40 mg Oral BID   sertraline  100 mg Oral Daily   sodium chloride flush  3 mL Intravenous Once    Dialysis Orders: [facility] MWF 4:00 400/600 EDW 75.3kg 2K/2.5Ca AVF Hep 5000 Last HD 1/10.  Mircera 200 q 2wks (last  06/16/23) Hectorol 1mg  TIW  Assessment/Plan: Acute CVA - multiple scattered infarcts on MRI.  Per neurology  Acute encephalopathy with aphasia- likely multifactorial, not improved MRSA bacteremia on BCx 1/14. Repeat cultures neg to date.  On IV Vanc. ID following.  ESRD -  HD MWF. Cont on schedule using AVF.  Hypertension/volume  -  BP ok. No volume on exam. Below prior EDW Anemia  - Hgb improved 11.9.   Aranesp 100 mcg given on  1/20.  Metabolic bone disease -  Calcium and P acceptable. Continue home meds when tolerating po  H/o SVT - on amiodarone  T2DM - per primary  GOC - Palliative care consulted - Code status now DNR/DNI. Ongoing GOC discussions.     Rogers Blocker, PA-C 07/07/2023, 9:03 AM  [facility] Pager: [phone]

## 2023-07-07 NOTE — TOC Progression Note (Signed)
Transition of Care [facility]) - Progression Note    Patient Details  Name: [name] MRN: [id number] Date of Birth: 1956-03-04  Transition of Care [facility]) CM/SW Contact  Kermit Balo, RN Phone Number: 07/07/2023, 4:24 PM  Clinical Narrative:     Family to discuss GOC and update palliative care when decision made.  TOC following.  Expected Discharge Plan: Skilled Nursing Facility Barriers to Discharge: Continued Medical Work up  Expected Discharge Plan and Services     Post Acute Care Choice: Skilled Nursing Facility Living arrangements for the past 2 months: Skilled Nursing Facility                                       Social Determinants of Health ([name]) Interventions [name] Screenings   Food Insecurity: No Food Insecurity (06/28/2023)  Housing: Low Risk  (06/28/2023)  Transportation Needs: No Transportation Needs (06/28/2023)  Utilities: Not At Risk (06/28/2023)  Alcohol Screen: Low Risk  (05/26/2023)  Depression (PHQ2-9): Low Risk  (05/26/2023)  Financial Resource Strain: Low Risk  (05/26/2023)  Physical Activity: Sufficiently Active (05/26/2023)  Social Connections: Unknown (06/28/2023)  Stress: No Stress Concern Present (05/26/2023)  Tobacco Use: Medium Risk (05/31/2023)  Health Literacy: Adequate Health Literacy (05/26/2023)    Readmission Risk Interventions    08/19/2022    8:11 AM 05/04/2022    9:06 AM 03/01/2022    8:14 AM  Readmission Risk Prevention Plan  Transportation Screening Complete Complete Complete  Medication Review (RN Care Manager) Complete Complete Complete  HRI or Home Care Consult Complete Complete Complete  SW Recovery Care/Counseling Consult Complete Complete Complete  Palliative Care Screening Not Applicable Not Applicable Not Applicable  Skilled Nursing Facility Not Applicable Not Applicable Not Applicable

## 2023-07-07 NOTE — Plan of Care (Signed)
  Problem: Ischemic Stroke/TIA Tissue Perfusion: Goal: Complications of ischemic stroke/TIA will be minimized Outcome: Not Progressing   Problem: Health Behavior/Discharge Planning: Goal: Ability to manage health-related needs will improve Outcome: Not Progressing Goal: Goals will be collaboratively established with patient/family Outcome: Not Progressing   Problem: Self-Care: Goal: Ability to communicate needs accurately will improve Outcome: Not Progressing   Problem: Nutrition: Goal: Risk of aspiration will decrease Outcome: Not Progressing Goal: Dietary intake will improve Outcome: Not Progressing   Problem: Skin Integrity: Goal: Risk for impaired skin integrity will decrease Outcome: Not Progressing

## 2023-07-07 NOTE — Progress Notes (Signed)
Received patient in bed to unit.  Alert and oriented.  Informed consent signed and in chart.   TX duration: 3hours  Patient tolerated well.  Transported back to the room  Alert, without acute distress.  Hand-off given to patient's nurse.   Access used: Left upper arm fistula Access issues: BFR to 350 due to high venous pressures  Total UF removed: 0 mL Medication(s) given: Vancomycin   07/07/23 1200  Vitals  Temp (!) 97.4 F (36.3 C)  Temp Source Oral  BP (!) 146/69  MAP (mmHg) 93  BP Location Right Arm  BP Method Automatic  Patient Position (if appropriate) Lying  Pulse Rate 75  Pulse Rate Source Monitor  ECG Heart Rate 76  Resp 13  Oxygen Therapy  SpO2 98 %  O2 Device Room Air  During Treatment Monitoring  Duration of HD Treatment -hour(s) 3 hour(s)  HD Safety Checks Performed Yes  Intra-Hemodialysis Comments Tx completed;Tolerated well  Dialysis Fluid Bolus Normal Saline  Bolus Amount (mL) 300 mL  Post Treatment  Dialyzer Clearance Lightly streaked  Liters Processed 62.3  Fluid Removed (mL) 0 mL  Tolerated HD Treatment Yes  Fistula / Graft Left Forearm Arteriovenous fistula  Placement Date/Time: 12/21/21 1112   Placed prior to admission: No  Orientation: Left  Access Location: Forearm  Access Type: (c) Arteriovenous fistula  Status Deaccessed     Stacie Glaze LPN [facility]

## 2023-07-07 NOTE — Progress Notes (Signed)
[facility] Progress Note Patient: [name] UJW:[id number] DOB: 1955/09/23 DOA: 06/27/2023  DOS: the patient was seen and examined on 07/07/2023  Brief Hospital Course: PMH of CAD s/p CABG, combined systolic and diastolic CHF, ESRD MWF, type 2 diabetes, hypertension, SVT, OSA, upper GI bleed who presented with slurred speech and AMS.  Pt moved into SNF about 9 months ago after a hospitalization. He was living along prior to that. He fractured his right hip last April and had declined and is now mostly bedbound and wheelchair bound. Caretaker last saw him well on 1/9. He was diagnosed with right sided pneumonia on 1/7. Last dialysis session was Thursday 1/9. Normally schedule is MWF but he reportedly refused it a day prior which is unlike him.   Presented as a code stroke, CTA multiple stenosis.  MRI confirmed multiple scattered acute strokes.  Neurology was consulted.   For ESRD nephrology was consulted for HD. ID was consulted for possible staph bacteremia. As the patient has poor mentation and continues to decline further, palliative care was consulted and family is leaning towards comfort.  Currently DNR. Assessment and Plan: Acute ischemic bilateral hemispheres punctate infarct. CT head negative. CTA shows evidence of multiple stenosis. CT perfusion scan no penumbra. MRI brain confirmed multiple foci of scattered acute infarct in the bilateral cerebral hemisphere. Echocardiogram shows EF 45 to 50%.  LDL 24.  Hemoglobin A1c 8.1. Not on any antithrombotic therapy prior to admission Later on recommendation was aspirin and Plavix for 3 weeks followed by aspirin alone. Neuro signed off on 1/16. Mentation continues to get better since 1/23.  Monitor.  Dysphagia. Speech therapy following. Secondary to stroke. On D2 diet thin liquid. Oral intake is minimal.  ESRD on HD.  MWF. Nephrology following. Plan is to follow-up on patient's progression after hemodialysis.  MRSA  bacteremia. Concern for CNS embolic process in the setting of presentation with acute stroke as well as bacteremia. TEE was requested but family currently leaning towards more comfort approach. Continue with IV antibiotics with dialysis. Appreciate ID consultation.  Acute encephalopathy, associated with a stroke as well as infection. Multifactorial. Less likely toxic. Most likely metabolic. Monitor.  Anemia of chronic kidney disease. H&H stable. Receiving iron aspirin.  Chronic systolic and diastolic CHF. HTN. History of SVT. Echo in the past had EF of 35 to 40%. Echo at this time around improved 45 to 50%. Clinically euvolemic.  Volume management with HD. Blood pressure improving. Currently on Imdur and Toprol-XL. Also on amiodarone.  Monitor.  CAD SP CABG. On aspirin and Plavix.  OSA. On nighttime O2. Not on CPAP. Monitor.  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 Currently on sliding scale insulin.  Hypothyroidism. On Synthroid.  Neuropathy. Obtained.  Currently on hold.  Chronic pain syndrome. On fentanyl patch. Dose increased to 50 mcg per palliative care.  Mood disorder. On Zoloft. Continue   Subjective: Again able to answer questions.  Denies any acute complaint.  No nausea no vomiting.  Oral intake adequate.  Was sleepy after HD.  Physical Exam: Aortic systolic murmur heard. Clear to auscultation otherwise poor Bowel sound present.  Data Reviewed: I have Reviewed nursing notes, Vitals, and Lab results. Reviewed CBC and BMP.  Discussed with palliative care.  Disposition: Status is: Inpatient Remains inpatient appropriate because: Monitor for clarity and goal of care  heparin injection 5,000 Units Start: 06/27/23 2200   Family Communication: None at bedside Level of care: Telemetry Medical  Vitals:   07/07/23 1200 07/07/23 1213 07/07/23 1258 07/07/23 1550   BP: (!) 146/69 (!) 148/63 (!) 145/64 105/62  Pulse: 75 76 81 78  Resp: 13 20 14    Temp: (!) 97.4 F (36.3 C)  98.6 F (37 C) 98.3 F (36.8 C)  TempSrc: Oral  Oral Oral  SpO2: 98% 97% 97% 100%  Weight:      Height:         Author: Lynden Oxford, MD 07/07/2023 8:19 PM  Please look on [email or web address] to find out who is on call.

## 2023-07-07 NOTE — Plan of Care (Signed)
Problem: Education: Goal: Knowledge of disease or condition will improve Outcome: Not Progressing Goal: Knowledge of secondary prevention will improve (MUST DOCUMENT ALL) Outcome: Not Progressing Goal: Knowledge of patient specific risk factors will improve [name] N/A or DELETE if not current risk factor) Outcome: Not Progressing   Problem: Ischemic Stroke/TIA Tissue Perfusion: Goal: Complications of ischemic stroke/TIA will be minimized Outcome: Not Progressing   Problem: Coping: Goal: Will verbalize positive feelings about self Outcome: Not Progressing Goal: Will identify appropriate support needs Outcome: Not Progressing

## 2023-07-07 NOTE — Progress Notes (Signed)
Asked patient if he needs anything at the moment and he responded with " Iced water". Fed patient with thickened water via spoon and patient tolerated 50 ml, this nurse told patient that I will keep on checking on him if he needs to eat or drink and he responded with "I appreciate it".

## 2023-07-08 ENCOUNTER — Inpatient Hospital Stay (HOSPITAL_COMMUNITY): Payer: Medicare Other

## 2023-07-08 DIAGNOSIS — I639 Cerebral infarction, unspecified: Secondary | ICD-10-CM | POA: Diagnosis not present

## 2023-07-08 LAB — GLUCOSE, CAPILLARY
Glucose-Capillary: 161 mg/dL — ABNORMAL HIGH (ref 70–99)
Glucose-Capillary: 167 mg/dL — ABNORMAL HIGH (ref 70–99)
Glucose-Capillary: 174 mg/dL — ABNORMAL HIGH (ref 70–99)
Glucose-Capillary: 217 mg/dL — ABNORMAL HIGH (ref 70–99)

## 2023-07-08 MED ORDER — BISACODYL 10 MG RE SUPP: 10.0000 mg | Freq: Once | RECTAL | Status: DC

## 2023-07-08 NOTE — Progress Notes (Signed)
[facility] Progress Note Patient: [name] [id number]:[id number] DOB: June 04, 1956 DOA: 06/27/2023  DOS: the patient was seen and examined on 07/08/2023  Brief Hospital Course: PMH of CAD s/p CABG, combined systolic and diastolic CHF, ESRD MWF, type 2 diabetes, hypertension, SVT, OSA, upper GI bleed who presented with slurred speech and AMS.  Pt moved into SNF about 9 months ago after a hospitalization. He was living along prior to that. He fractured his right hip last April and had declined and is now mostly bedbound and wheelchair bound. Caretaker last saw him well on 1/9. He was diagnosed with right sided pneumonia on 1/7. Last dialysis session was Thursday 1/9. Normally schedule is MWF but he reportedly refused it a day prior which is unlike him.   Presented as a code stroke, CTA multiple stenosis.  MRI confirmed multiple scattered acute strokes.  Neurology was consulted.   For ESRD nephrology was consulted for HD. ID was consulted for possible staph bacteremia. As the patient has poor mentation and continues to decline further, palliative care was consulted and family is leaning towards comfort.  Currently DNR. Assessment and Plan: Acute ischemic bilateral hemispheres punctate infarct. CT head negative. CTA shows evidence of multiple stenosis. CT perfusion scan no penumbra. MRI brain confirmed multiple foci of scattered acute infarct in the bilateral cerebral hemisphere. Echocardiogram shows EF 45 to 50%.  LDL 24.  Hemoglobin A1c 8.1. Not on any antithrombotic therapy prior to admission Later on recommendation was aspirin and Plavix for 3 weeks followed by aspirin alone. Neuro signed off on 1/16. Mentation continues to get better since 1/23.  Monitor.  Dysphagia. Speech therapy following. Secondary to stroke. On D2 diet thin liquid. Oral intake is minimal.  ESRD on HD.  MWF. Nephrology following. Plan is to follow-up on patient's progression after hemodialysis.  MRSA  bacteremia. Concern for CNS embolic process in the setting of presentation with acute stroke as well as bacteremia. TEE was requested but family currently leaning towards more comfort approach. Continue with IV antibiotics with dialysis. Appreciate ID consultation.  Acute encephalopathy, associated with a stroke as well as infection. Multifactorial. Less likely toxic. Most likely metabolic. Monitor.  Anemia of chronic kidney disease. H&H stable. Receiving iron aspirin.  Chronic systolic and diastolic CHF. HTN. History of SVT. Echo in the past had EF of 35 to 40%. Echo at this time around improved 45 to 50%. Clinically euvolemic.  Volume management with HD. Blood pressure improving. Currently on Imdur and Toprol-XL. Also on amiodarone.  Monitor.  CAD SP CABG. On aspirin and Plavix.  OSA. On nighttime O2. Not on CPAP. Monitor.  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 Currently on sliding scale insulin.  Hypothyroidism. On Synthroid.  Neuropathy. Obtained.  Currently on hold.  Chronic pain syndrome. On fentanyl patch. Dose increased to 50 mcg per palliative care.  Mood disorder. On Zoloft. Continue   Constipation. Dulcolax suppository.  Subjective: Reports some nausea.  More fatigued today.  No abdominal pain.  Physical Exam: Aortic murmur. Clear to auscultation. Bowel sound present.  Nontender. No edema  Data Reviewed: I have Reviewed nursing notes, Vitals, and Lab results. X-ray abdomen ordered.  Disposition: Status is: Inpatient Remains inpatient appropriate because: Monitor for clarity and goal of care  heparin injection 5,000 Units Start: 06/27/23 2200   Family Communication: None at bedside Level of care: Telemetry Medical   Vitals:   07/08/23 0358 07/08/23 0751 07/08/23 1124 07/08/23 1550  BP:  135/68 (!) 137/51 (!) 164/70 136/70  Pulse: 76 75 76 72  Resp:  18 16 16 17   Temp: 97.7 F (36.5 C) 98.6 F (37 C) 98.7 F (37.1 C) 98.8 F (37.1 C)  TempSrc: Oral Axillary Axillary Oral  SpO2: 97% 93% 95% 97%  Weight:      Height:         Author: Lynden Oxford, MD 07/08/2023 7:18 PM  Please look on [email or web address] to find out who is on call.

## 2023-07-08 NOTE — Plan of Care (Signed)
  Problem: Education: Goal: Knowledge of disease or condition will improve Outcome: Not Progressing Goal: Knowledge of secondary prevention will improve (MUST DOCUMENT ALL) Outcome: Not Progressing Goal: Knowledge of patient specific risk factors will improve Loraine Leriche N/A or DELETE if not current risk factor) Outcome: Not Progressing   Problem: Ischemic Stroke/TIA Tissue Perfusion: Goal: Complications of ischemic stroke/TIA will be minimized Outcome: Progressing   Problem: Coping: Goal: Will verbalize positive feelings about self Outcome: Progressing Goal: Will identify appropriate support needs Outcome: Progressing   Problem: Health Behavior/Discharge Planning: Goal: Ability to manage health-related needs will improve Outcome: Not Progressing Goal: Goals will be collaboratively established with patient/family Outcome: Progressing   Problem: Self-Care: Goal: Ability to participate in self-care as condition permits will improve Outcome: Not Progressing Goal: Verbalization of feelings and concerns over difficulty with self-care will improve Outcome: Progressing Goal: Ability to communicate needs accurately will improve Outcome: Progressing   Problem: Nutrition: Goal: Risk of aspiration will decrease Outcome: Progressing Goal: Dietary intake will improve Outcome: Not Progressing   Problem: Education: Goal: Ability to describe self-care measures that may prevent or decrease complications (Diabetes Survival Skills Education) will improve Outcome: Not Progressing Goal: Individualized Educational Video(s) Outcome: Not Applicable   Problem: Coping: Goal: Ability to adjust to condition or change in health will improve Outcome: Not Progressing   Problem: Fluid Volume: Goal: Ability to maintain a balanced intake and output will improve Outcome: Not Progressing   Problem: Health Behavior/Discharge Planning: Goal: Ability to identify and utilize available resources and services  will improve Outcome: Progressing Goal: Ability to manage health-related needs will improve Outcome: Not Progressing   Problem: Metabolic: Goal: Ability to maintain appropriate glucose levels will improve Outcome: Progressing   Problem: Nutritional: Goal: Maintenance of adequate nutrition will improve Outcome: Not Progressing Goal: Progress toward achieving an optimal weight will improve Outcome: Not Progressing   Problem: Skin Integrity: Goal: Risk for impaired skin integrity will decrease Outcome: Progressing   Problem: Tissue Perfusion: Goal: Adequacy of tissue perfusion will improve Outcome: Progressing   Problem: Education: Goal: Knowledge of General Education information will improve Description: Including pain rating scale, medication(s)/side effects and non-pharmacologic comfort measures Outcome: Not Progressing   Problem: Health Behavior/Discharge Planning: Goal: Ability to manage health-related needs will improve Outcome: Not Progressing   Problem: Clinical Measurements: Goal: Ability to maintain clinical measurements within normal limits will improve Outcome: Progressing Goal: Will remain free from infection Outcome: Progressing Goal: Diagnostic test results will improve Outcome: Progressing Goal: Respiratory complications will improve Outcome: Progressing Goal: Cardiovascular complication will be avoided Outcome: Progressing   Problem: Activity: Goal: Risk for activity intolerance will decrease Outcome: Not Progressing   Problem: Nutrition: Goal: Adequate nutrition will be maintained Outcome: Progressing   Problem: Coping: Goal: Level of anxiety will decrease Outcome: Progressing   Problem: Elimination: Goal: Will not experience complications related to bowel motility Outcome: Progressing Goal: Will not experience complications related to urinary retention Outcome: Progressing   Problem: Pain Managment: Goal: General experience of comfort  will improve and/or be controlled Outcome: Progressing   Problem: Safety: Goal: Ability to remain free from injury will improve Outcome: Progressing   Problem: Skin Integrity: Goal: Risk for impaired skin integrity will decrease Outcome: Progressing

## 2023-07-08 NOTE — Progress Notes (Signed)
Pharmacy Antibiotic Note  [name] is a 68 y.o. male admitted on 06/27/2023 with  altered mental status with CVA, now found to have Staphylococcus aureus bacteremia .  Pharmacy has been consulted for vancomycin dosing.  Cultures growing methicillin resistant staphylococcus aureus. Noted CVA concerning for possible infective endocarditis, TEE was requested but family is leaning towards a comfort approach.   Plan: Cont vancomycin 750 mg IV every HD MWF Consider a level next week if patient remains full care, with random level goal of 15-25 Continue to monitor cultures and clinical picture  Height: 5\' 10"  (177.8 cm) Weight:  (unable to obtain due to specialty bed) IBW/kg (Calculated) : 73  Temp (24hrs), Avg:98.2 F (36.8 C), Min:97.4 F (36.3 C), Max:98.6 F (37 C)  Recent Labs  Lab 07/04/23 0454 07/05/23 1119 07/07/23 0816  WBC  --  7.6 9.0  CREATININE  --  5.08* 4.86*  VANCORANDOM 18  --   --     Estimated Creatinine Clearance: 15.3 mL/min (A) (by C-G formula based on SCr of 4.86 mg/dL (H)).    Allergies  Allergen Reactions   Bee Venom Anaphylaxis   Atorvastatin Other (See Comments)    myalgia   Diltiazem Itching   Lopressor [Metoprolol]     NIGHTMARES. Pt tolerating this now 11/25/22   Oxycodone Other (See Comments)    Reaction not listed on MAR    Reglan [Metoclopramide] Other (See Comments)    Suicidal    Rosuvastatin Other (See Comments)    myalgias   Valsartan Itching   Zyrtec [Cetirizine] Other (See Comments)    intolerance    Antimicrobials this admission: Ceftriaxone 1/14 >> 1/17 Doxycycline 1/14 >> 1/17 Vancomycin 1/17 >>   Dose adjustments this admission: 1/20 PreHD vanc 18  Microbiology results: 1/14 BCx: 1/4 S. [place], 1/4 MRSA 1/16 MRSA PCR: detected 1/19 Bcx: ngtd  Thank you for involving pharmacy in the patient's care.   Theotis Burrow, PharmD PGY1 Acute Care Pharmacy Resident  07/08/2023 7:33 AM

## 2023-07-08 NOTE — Plan of Care (Signed)
Stroke Care Plan: Problem: Education: Goal: Knowledge of disease or condition will improve 07/08/2023 1617 by Juluis Mire, RN Outcome: Not Progressing 07/08/2023 1537 by Juluis Mire, RN Outcome: Not Progressing Goal: Knowledge of secondary prevention will improve (MUST DOCUMENT ALL) 07/08/2023 1617 by Juluis Mire, RN Outcome: Not Progressing 07/08/2023 1537 by Juluis Mire, RN Outcome: Not Progressing Goal: Knowledge of patient specific risk factors will improve Loraine Leriche N/A or DELETE if not current risk factor) 07/08/2023 1617 by Juluis Mire, RN Outcome: Not Progressing 07/08/2023 1537 by Juluis Mire, RN Outcome: Not Progressing   Problem: Ischemic Stroke/TIA Tissue Perfusion: Goal: Complications of ischemic stroke/TIA will be minimized 07/08/2023 1617 by Juluis Mire, RN Outcome: Progressing 07/08/2023 1537 by Juluis Mire, RN Outcome: Progressing   Problem: Coping: Goal: Will verbalize positive feelings about self 07/08/2023 1617 by Juluis Mire, RN Outcome: Progressing 07/08/2023 1537 by Juluis Mire, RN Outcome: Progressing Goal: Will identify appropriate support needs 07/08/2023 1617 by Juluis Mire, RN Outcome: Progressing 07/08/2023 1537 by Juluis Mire, RN Outcome: Progressing   Problem: Health Behavior/Discharge Planning: Goal: Ability to manage health-related needs will improve 07/08/2023 1617 by Juluis Mire, RN Outcome: Not Progressing 07/08/2023 1537 by Juluis Mire, RN Outcome: Not Progressing Goal: Goals will be collaboratively established with patient/family 07/08/2023 1617 by Juluis Mire, RN Outcome: Progressing 07/08/2023 1537 by Juluis Mire, RN Outcome: Progressing   Problem: Self-Care: Goal: Ability to participate in self-care as condition permits will improve 07/08/2023 1617 by Juluis Mire, RN Outcome: Not Progressing 07/08/2023 1537 by  Juluis Mire, RN Outcome: Not Progressing Goal: Verbalization of feelings and concerns over difficulty with self-care will improve 07/08/2023 1617 by Juluis Mire, RN Outcome: Progressing 07/08/2023 1537 by Juluis Mire, RN Outcome: Progressing Goal: Ability to communicate needs accurately will improve 07/08/2023 1617 by Juluis Mire, RN Outcome: Progressing 07/08/2023 1537 by Juluis Mire, RN Outcome: Progressing   Problem: Nutrition: Goal: Risk of aspiration will decrease 07/08/2023 1617 by Juluis Mire, RN Outcome: Not Progressing 07/08/2023 1537 by Juluis Mire, RN Outcome: Progressing Goal: Dietary intake will improve 07/08/2023 1617 by Juluis Mire, RN Outcome: Not Progressing 07/08/2023 1537 by Juluis Mire, RN Outcome: Not Progressing   Problem: Education: Goal: Ability to describe self-care measures that may prevent or decrease complications (Diabetes Survival Skills Education) will improve Outcome: Not Progressing Goal: Individualized Educational Video(s) Outcome: Not Applicable   Problem: Coping: Goal: Ability to adjust to condition or change in health will improve Outcome: Not Progressing   Problem: Fluid Volume: Goal: Ability to maintain a balanced intake and output will improve Outcome: Not Progressing   Problem: Health Behavior/Discharge Planning: Goal: Ability to identify and utilize available resources and services will improve Outcome: Progressing Goal: Ability to manage health-related needs will improve Outcome: Not Progressing   Problem: Metabolic: Goal: Ability to maintain appropriate glucose levels will improve Outcome: Progressing   Problem: Nutritional: Goal: Maintenance of adequate nutrition will improve Outcome: Not Progressing Goal: Progress toward achieving an optimal weight will improve Outcome: Not Progressing   Problem: Skin Integrity: Goal: Risk for impaired skin integrity  will decrease Outcome: Progressing   Problem: Tissue Perfusion: Goal: Adequacy of tissue perfusion will improve Outcome: Progressing   Problem: Education: Goal: Knowledge of General Education information will improve Description: Including pain rating scale, medication(s)/side effects and non-pharmacologic comfort measures Outcome: Not Progressing   Problem: Health Behavior/Discharge Planning: Goal: Ability to  manage health-related needs will improve Outcome: Not Progressing   Problem: Clinical Measurements: Goal: Ability to maintain clinical measurements within normal limits will improve Outcome: Progressing Goal: Will remain free from infection Outcome: Progressing Goal: Diagnostic test results will improve Outcome: Progressing Goal: Respiratory complications will improve Outcome: Progressing Goal: Cardiovascular complication will be avoided Outcome: Progressing   Problem: Activity: Goal: Risk for activity intolerance will decrease Outcome: Not Progressing   Problem: Nutrition: Goal: Adequate nutrition will be maintained Outcome: Progressing   Problem: Coping: Goal: Level of anxiety will decrease Outcome: Progressing   Problem: Elimination: Goal: Will not experience complications related to bowel motility Outcome: Progressing Goal: Will not experience complications related to urinary retention Outcome: Progressing   Problem: Pain Managment: Goal: General experience of comfort will improve and/or be controlled Outcome: Progressing   Problem: Safety: Goal: Ability to remain free from injury will improve Outcome: Progressing   Problem: Skin Integrity: Goal: Risk for impaired skin integrity will decrease Outcome: Progressing

## 2023-07-08 NOTE — Progress Notes (Signed)
Patient refusing all meds at this time. Meds crushed and attempted to administer to pt. Pt refusing to swallow. Attempted with sips of water and pt refused to swallow anything stating "I do not want to take those".

## 2023-07-08 NOTE — Progress Notes (Addendum)
[facility] Progress Note   Subjective:   Tolerated HD yesterday with no UF. Reports he does not feel well today, tried to eat and became nauseous. Denies SOB, CP, dizziness.   Objective Vitals:   07/07/23 2248 07/08/23 0358 07/08/23 0751 07/08/23 1124  BP: 129/63 135/68 (!) 137/51 (!) 164/70  Pulse: 81 76 75 76  Resp: 19 18 16 16   Temp: 98.4 F (36.9 C) 97.7 F (36.5 C) 98.6 F (37 C) 98.7 F (37.1 C)  TempSrc: Axillary Oral Axillary Axillary  SpO2: 96% 97% 93% 95%  Weight:      Height:       Physical Exam General: frail appearing male, alert, NAD Heart: RRR, no murmurs, rubs or gallops Lungs: CTA anteriorly, respirations unlabored Abdomen: Soft, non-distended, +BS Extremities: No edema b/l lower extremities Dialysis Access: LUE AVF +t/b  Additional Objective Labs: Basic Metabolic Panel: Recent Labs  Lab 07/05/23 1119 07/07/23 0816  NA 138 135  K 3.9 3.5  CL 96* 94*  CO2 22 26  GLUCOSE 146* 138*  BUN 33* 30*  CREATININE 5.08* 4.86*  CALCIUM 8.9 9.0  PHOS 4.5 3.7   Liver Function Tests: Recent Labs  Lab 07/05/23 1119 07/07/23 0816  ALBUMIN 2.9* 2.8*   No results for input(s): "LIPASE", "AMYLASE" in the last 168 hours. CBC: Recent Labs  Lab 07/05/23 1119 07/07/23 0816  WBC 7.6 9.0  NEUTROABS 5.7  --   HGB 12.5* 11.9*  HCT 41.4 39.6  MCV 81.2 80.8  PLT 304 342   Blood Culture    Component Value Date/Time   SDES BLOOD LEFT HAND 07/02/2023 1046   SPECREQUEST  07/02/2023 1046    BOTTLES DRAWN AEROBIC AND ANAEROBIC Blood Culture adequate volume   CULT  07/02/2023 1046    NO GROWTH 5 DAYS Performed at [facility], [address], [place], [place] [address]    REPTSTATUS 07/07/2023 FINAL 07/02/2023 1046    Cardiac Enzymes: No results for input(s): "CKTOTAL", "CKMB", "CKMBINDEX", "TROPONINI" in the last 168 hours. CBG: Recent Labs  Lab 07/07/23 0614 07/07/23 1253 07/07/23 1758 07/08/23 0005 07/08/23 0638  GLUCAP  142* 133* 187* 161* 167*   Iron Studies: No results for input(s): "IRON", "TIBC", "TRANSFERRIN", "FERRITIN" in the last 72 hours. @lablastinr3 @ Studies/Results: No results found. Medications:  vancomycin Stopped (07/07/23 1305)     stroke: early stages of recovery book   Does not apply Once   amiodarone  200 mg Oral BID   aspirin EC  81 mg Oral Daily   calcium acetate  667 mg Oral 3 times per day on Sunday Tuesday Thursday Saturday   And   calcium acetate  667 mg Oral 2 times per day on Monday Wednesday Friday   Chlorhexidine Gluconate Cloth  6 each Topical Q0600   clopidogrel  75 mg Oral Daily   feeding supplement  237 mL Oral BID BM   heparin  5,000 Units Subcutaneous Q8H   isosorbide mononitrate  30 mg Oral Daily   levothyroxine  50 mcg Oral Daily   metoprolol succinate  50 mg Oral Daily   mupirocin ointment   Nasal BID   mouth rinse  15 mL Mouth Rinse 4 times per day   pantoprazole  40 mg Oral BID   sertraline  100 mg Oral Daily   sodium chloride flush  3 mL Intravenous Once    Dialysis Orders: [facility] MWF 4:00 400/600 EDW 75.3kg 2K/2.5Ca AVF Hep 5000 Last HD 1/10.  Mircera 200  q 2wks (last 06/16/23) Hectorol 1mg  TIW    Assessment/Plan:  Acute CVA - multiple scattered infarcts on MRI.  Per neurology  Acute encephalopathy with aphasia- likely multifactorial, not improved MRSA bacteremia on BCx 1/14. Repeat cultures neg to date.  On IV Vanc. ID following.  ESRD -  HD MWF. Cont on schedule using AVF.  Hypertension/volume  -  BP ok. No volume on exam. Below prior EDW, minimal PO intake Anemia  - Hgb improved 11.9.   Aranesp 100 mcg given on  1/20.  Metabolic bone disease -  Calcium and P acceptable. Continue home meds when tolerating po  H/o SVT - on amiodarone  T2DM - per primary  GOC - Palliative care consulted - Code status now DNR/DNI. Ongoing GOC discussions.     Rogers Blocker, PA-C 07/08/2023, 11:47 AM  [facility] Pager: [phone]

## 2023-07-09 DIAGNOSIS — I639 Cerebral infarction, unspecified: Secondary | ICD-10-CM | POA: Diagnosis not present

## 2023-07-09 LAB — GLUCOSE, CAPILLARY
Glucose-Capillary: 129 mg/dL — ABNORMAL HIGH (ref 70–99)
Glucose-Capillary: 148 mg/dL — ABNORMAL HIGH (ref 70–99)

## 2023-07-09 MED ORDER — CHLORHEXIDINE GLUCONATE CLOTH 2 % EX PADS
6.0000 | MEDICATED_PAD | Freq: Every day | CUTANEOUS | Status: DC
  Administered 2023-07-09 – 2023-07-12 (×4): 6 via TOPICAL

## 2023-07-09 MED ORDER — METOPROLOL SUCCINATE ER 25 MG PO TB24
12.5000 mg | ORAL_TABLET | Freq: Every day | ORAL | Status: DC
  Administered 2023-07-10 – 2023-07-11 (×2): 12.5 mg via ORAL
  Filled 2023-07-09 (×2): qty 1

## 2023-07-09 NOTE — Progress Notes (Signed)
[facility] Progress Note   Subjective:   Noted pt refused morning meds. He reports cough today, inconsistently reports pain in his lungs/abdomen but then denies. Denies CP and SOB.   Objective Vitals:   07/08/23 1550 07/08/23 2009 07/09/23 0014 07/09/23 0416  BP: 136/70 (!) 156/67 (!) 151/67 128/66  Pulse: 72 73 70 69  Resp: 17 18 18 16   Temp: 98.8 F (37.1 C) 98.3 F (36.8 C) 98.5 F (36.9 C) 97.9 F (36.6 C)  TempSrc: Oral Oral Oral Oral  SpO2: 97% 97% 99% 98%  Weight:      Height:       Physical Exam General: Frail appearing male, alert and in NAD Heart: RRR, no murmurs, rubs or gallops Lungs: CTA bilaterally, respirations unlabored on RA Abdomen: Soft, non-distended, +BS Extremities: No edema b/l lower extremities Dialysis Access:  LUE AVF + t/b   Additional Objective Labs: Basic Metabolic Panel: Recent Labs  Lab 07/05/23 1119 07/07/23 0816  NA 138 135  K 3.9 3.5  CL 96* 94*  CO2 22 26  GLUCOSE 146* 138*  BUN 33* 30*  CREATININE 5.08* 4.86*  CALCIUM 8.9 9.0  PHOS 4.5 3.7   Liver Function Tests: Recent Labs  Lab 07/05/23 1119 07/07/23 0816  ALBUMIN 2.9* 2.8*   No results for input(s): "LIPASE", "AMYLASE" in the last 168 hours. CBC: Recent Labs  Lab 07/05/23 1119 07/07/23 0816  WBC 7.6 9.0  NEUTROABS 5.7  --   HGB 12.5* 11.9*  HCT 41.4 39.6  MCV 81.2 80.8  PLT 304 342   Blood Culture    Component Value Date/Time   SDES BLOOD LEFT HAND 07/02/2023 1046   SPECREQUEST  07/02/2023 1046    BOTTLES DRAWN AEROBIC AND ANAEROBIC Blood Culture adequate volume   CULT  07/02/2023 1046    NO GROWTH 5 DAYS Performed at [facility], [address], [place], [place] [address]    REPTSTATUS 07/07/2023 FINAL 07/02/2023 1046    Cardiac Enzymes: No results for input(s): "CKTOTAL", "CKMB", "CKMBINDEX", "TROPONINI" in the last 168 hours. CBG: Recent Labs  Lab 07/08/23 0638 07/08/23 1147 07/08/23 1827 07/09/23 0011  07/09/23 0643  GLUCAP 167* 217* 174* 148* 129*   Iron Studies: No results for input(s): "IRON", "TIBC", "TRANSFERRIN", "FERRITIN" in the last 72 hours. @lablastinr3 @ Studies/Results: DG Abd Portable 1V Result Date: 07/08/2023 CLINICAL DATA:  Nausea EXAM: PORTABLE ABDOMEN - 1 VIEW COMPARISON:  09/13/2018 FINDINGS: Scattered large and small bowel gas is noted. No free air is seen. No constipation or obstructive changes are noted. Mild retained fecal material is noted within the rectal vault. Postsurgical changes in the right hip are seen. IMPRESSION: Mild retained fecal material within the rectal wall. Electronically Signed   By: Alcide Clever M.D.   On: 07/08/2023 18:59   Medications:  vancomycin Stopped (07/07/23 1305)     stroke: early stages of recovery book   Does not apply Once   amiodarone  200 mg Oral BID   aspirin EC  81 mg Oral Daily   bisacodyl  10 mg Rectal Once   calcium acetate  667 mg Oral 3 times per day on Sunday Tuesday Thursday Saturday   And   calcium acetate  667 mg Oral 2 times per day on Monday Wednesday Friday   Chlorhexidine Gluconate Cloth  6 each Topical Q0600   clopidogrel  75 mg Oral Daily   feeding supplement  237 mL Oral BID BM   heparin  5,000 Units Subcutaneous  Q8H   isosorbide mononitrate  30 mg Oral Daily   levothyroxine  50 mcg Oral Daily   metoprolol succinate  50 mg Oral Daily   mupirocin ointment   Nasal BID   mouth rinse  15 mL Mouth Rinse 4 times per day   pantoprazole  40 mg Oral BID   sertraline  100 mg Oral Daily   sodium chloride flush  3 mL Intravenous Once    Dialysis Orders: [facility] MWF 4:00 400/600 EDW 75.3kg 2K/2.5Ca AVF Hep 5000 Last HD 1/10.  Mircera 200 q 2wks (last 06/16/23) Hectorol 1mg  TIW  Assessment/Plan: 1.Acute CVA - multiple scattered infarcts on MRI.  Per neurology  2.Acute encephalopathy with aphasia- likely multifactorial, improving somewhat 3.MRSA bacteremia on BCx 1/14. Repeat cultures neg to date.  On IV Vanc. ID  following.  4.ESRD -  HD MWF. Cont on schedule using AVF.  5.Hypertension/volume  -  BP ok. No volume on exam. Below prior EDW, minimal PO intake 6.Anemia  - Hgb improved 11.9.   Aranesp 100 mcg given on  1/20.  7.Metabolic bone disease -  Calcium and P acceptable. Continue home meds when tolerating po  8. H/o SVT - on amiodarone  9. T2DM - per primary  10. GOC - Palliative care consulted - Code status now DNR/DNI. Not eating much and refused meds today. Ongoing GOC discussions  Rogers Blocker, Cordelia Poche 07/09/2023, 9:23 AM  [facility] Pager: [phone]

## 2023-07-09 NOTE — Progress Notes (Signed)
[facility] Progress Note Patient: [name] NGE:[id number] DOB: May 24, 1956 DOA: 06/27/2023  DOS: the patient was seen and examined on 07/09/2023  Brief Hospital Course: PMH of CAD s/p CABG, combined systolic and diastolic CHF, ESRD MWF, type 2 diabetes, hypertension, SVT, OSA, upper GI bleed who presented with slurred speech and AMS.  Pt moved into SNF about 9 months ago after a hospitalization. He was living along prior to that. He fractured his right hip last April and had declined and is now mostly bedbound and wheelchair bound. Caretaker last saw him well on 1/9. He was diagnosed with right sided pneumonia on 1/7.    Presented as a code stroke, CTA multiple stenosis.  MRI confirmed multiple scattered acute strokes.  Neurology was consulted.    For ESRD nephrology was consulted for HD.  ID was consulted for possible staph bacteremia. As the patient has poor mentation and continues to decline further, palliative care was consulted and family is leaning towards comfort.  Currently DNR. Mentation appears to be improving although patient still significantly fatigued.  Assessment and Plan: Acute ischemic bilateral hemispheres punctate infarct. CT head negative. CTA shows evidence of multiple stenosis. CT perfusion scan no penumbra. MRI brain confirmed multiple foci of scattered acute infarct in the bilateral cerebral hemisphere. Echocardiogram shows EF 45 to 50%.  LDL 24.  Hemoglobin A1c 8.1. Not on any antithrombotic therapy prior to admission Later on recommendation was aspirin and Plavix for 3 weeks followed by aspirin alone. Neuro signed off on 1/16. Mentation better since 1/23.  Dysphagia. Speech therapy following. Secondary to stroke. On D2 diet thin liquid. Oral intake is 15 to 45%.  ESRD on HD.  MWF. Nephrology following. Will monitor progression.  So far appears to be tolerating HD.  MRSA bacteremia. Concern for CNS embolic process in the setting of  presentation with acute stroke as well as bacteremia. TEE was requested but since the family is leaning towards more comfort approach, currently canceled. Continue with IV antibiotics with dialysis. Appreciate ID consultation.  Acute encephalopathy, associated with a stroke as well as infection. Multifactorial. Less likely toxic. Most likely metabolic. Improving.  Monitor.  Anemia of chronic kidney disease. H&H stable. Receiving iron aspirin.  Chronic systolic and diastolic CHF. HTN. History of SVT. CAD SP CABG, cath 8/23 patent grafts. Echo in the past had EF of 35 to 40%. Echo this admission improved 45 to 50%. Clinically euvolemic.  Volume management with HD. Currently on Imdur and Toprol-XL. Also on amiodarone. On aspirin and Plavix. Blood pressure has been soft.  Will hold Imdur and reduce Toprol-XL to 12.5 mg.  OSA. On nighttime O2. Not on CPAP. Monitor.  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 Currently on sliding scale insulin.  Hypothyroidism. On Synthroid. TSH 11.6 on admission.  Neuropathy. On gabapentin.  Currently on hold.  Chronic pain syndrome. Patient is currently not on fentanyl patch. Currently on fentanyl as needed only. Has not received narcotics since 1/20.  Mood disorder. On Zoloft. Continue  GERD. On PPI twice daily.   Subjective: Denies any acute complaint but later on says that he has stomach pain.  Refused his medication in the morning but on my evaluation was agreeable to take his medication and took his medication when the RN offered him.  No other events overnight  Physical Exam: In mild distress. Clear to auscultation. S1-S2 present. Aortic systolic murmur present. Bowel sound present.  Nontender. No edema. Alert and awake.  Oriented to self.  Data Reviewed: I have Reviewed nursing notes, Vitals, and Lab results. Since last encounter,  pertinent lab results CBC and CMP   . I have ordered test including CBC and CMP  .   Disposition: Status is: Inpatient Remains inpatient appropriate because: Monitor for improvement in mentation and clarity on goals of care.  heparin injection 5,000 Units Start: 06/27/23 2200   Family Communication: No one at bedside Level of care: Telemetry Medical continue. Vitals:   07/09/23 0416 07/09/23 0800 07/09/23 1200 07/09/23 1600  BP: 128/66 (!) 92/56 95/60 (!) 91/54  Pulse: 69 69 74 72  Resp: 16  18 16   Temp: 97.9 F (36.6 C) 98 F (36.7 C) 98.2 F (36.8 C) 98 F (36.7 C)  TempSrc: Oral Oral Oral Oral  SpO2: 98% 97% 96% 96%  Weight:      Height:         Author: Lynden Oxford, MD 07/09/2023 5:31 PM  Please look on [email or web address] to find out who is on call.

## 2023-07-09 NOTE — Plan of Care (Signed)
  Problem: Education: Goal: Knowledge of disease or condition will improve Outcome: Not Progressing Goal: Knowledge of secondary prevention will improve (MUST DOCUMENT ALL) Outcome: Not Progressing Goal: Knowledge of patient specific risk factors will improve Loraine Leriche N/A or DELETE if not current risk factor) Outcome: Not Progressing   Problem: Ischemic Stroke/TIA Tissue Perfusion: Goal: Complications of ischemic stroke/TIA will be minimized Outcome: Not Progressing   Problem: Coping: Goal: Will verbalize positive feelings about self Outcome: Progressing Goal: Will identify appropriate support needs Outcome: Progressing   Problem: Health Behavior/Discharge Planning: Goal: Ability to manage health-related needs will improve Outcome: Not Progressing Goal: Goals will be collaboratively established with patient/family Outcome: Progressing   Problem: Self-Care: Goal: Ability to participate in self-care as condition permits will improve Outcome: Not Progressing Goal: Verbalization of feelings and concerns over difficulty with self-care will improve Outcome: Progressing Goal: Ability to communicate needs accurately will improve Outcome: Progressing   Problem: Nutrition: Goal: Risk of aspiration will decrease Outcome: Progressing Goal: Dietary intake will improve Outcome: Not Progressing   Problem: Education: Goal: Ability to describe self-care measures that may prevent or decrease complications (Diabetes Survival Skills Education) will improve Outcome: Not Progressing   Problem: Coping: Goal: Ability to adjust to condition or change in health will improve Outcome: Progressing   Problem: Fluid Volume: Goal: Ability to maintain a balanced intake and output will improve Outcome: Progressing   Problem: Health Behavior/Discharge Planning: Goal: Ability to identify and utilize available resources and services will improve Outcome: Not Progressing Goal: Ability to manage  health-related needs will improve Outcome: Not Progressing   Problem: Metabolic: Goal: Ability to maintain appropriate glucose levels will improve Outcome: Progressing   Problem: Nutritional: Goal: Maintenance of adequate nutrition will improve Outcome: Not Progressing Goal: Progress toward achieving an optimal weight will improve Outcome: Not Progressing   Problem: Skin Integrity: Goal: Risk for impaired skin integrity will decrease Outcome: Progressing   Problem: Tissue Perfusion: Goal: Adequacy of tissue perfusion will improve Outcome: Progressing   Problem: Education: Goal: Knowledge of General Education information will improve Description: Including pain rating scale, medication(s)/side effects and non-pharmacologic comfort measures Outcome: Progressing   Problem: Health Behavior/Discharge Planning: Goal: Ability to manage health-related needs will improve Outcome: Not Progressing   Problem: Clinical Measurements: Goal: Ability to maintain clinical measurements within normal limits will improve Outcome: Progressing Goal: Will remain free from infection Outcome: Progressing Goal: Diagnostic test results will improve Outcome: Progressing Goal: Respiratory complications will improve Outcome: Progressing Goal: Cardiovascular complication will be avoided Outcome: Progressing   Problem: Activity: Goal: Risk for activity intolerance will decrease Outcome: Not Progressing   Problem: Nutrition: Goal: Adequate nutrition will be maintained Outcome: Progressing   Problem: Coping: Goal: Level of anxiety will decrease Outcome: Progressing   Problem: Elimination: Goal: Will not experience complications related to bowel motility Outcome: Progressing Goal: Will not experience complications related to urinary retention Outcome: Progressing   Problem: Pain Managment: Goal: General experience of comfort will improve and/or be controlled Outcome: Progressing    Problem: Safety: Goal: Ability to remain free from injury will improve Outcome: Progressing   Problem: Skin Integrity: Goal: Risk for impaired skin integrity will decrease Outcome: Progressing

## 2023-07-09 NOTE — Progress Notes (Signed)
Patient refused all his oral meds  due at 2200 and 0600.

## 2023-07-09 NOTE — Plan of Care (Signed)
  Problem: Education: Goal: Knowledge of disease or condition will improve Outcome: Not Progressing   Problem: Ischemic Stroke/TIA Tissue Perfusion: Goal: Complications of ischemic stroke/TIA will be minimized Outcome: Progressing   Problem: Coping: Goal: Will verbalize positive feelings about self Outcome: Not Progressing

## 2023-07-10 DIAGNOSIS — I639 Cerebral infarction, unspecified: Secondary | ICD-10-CM | POA: Diagnosis not present

## 2023-07-10 DIAGNOSIS — N186 End stage renal disease: Secondary | ICD-10-CM | POA: Diagnosis not present

## 2023-07-10 DIAGNOSIS — Z515 Encounter for palliative care: Secondary | ICD-10-CM | POA: Diagnosis not present

## 2023-07-10 DIAGNOSIS — Z7189 Other specified counseling: Secondary | ICD-10-CM | POA: Diagnosis not present

## 2023-07-10 LAB — RENAL FUNCTION PANEL
Albumin: 2.6 g/dL — ABNORMAL LOW (ref 3.5–5.0)
Anion gap: 12 (ref 5–15)
BUN: 38 mg/dL — ABNORMAL HIGH (ref 8–23)
CO2: 26 mmol/L (ref 22–32)
Calcium: 8.9 mg/dL (ref 8.9–10.3)
Chloride: 98 mmol/L (ref 98–111)
Creatinine, Ser: 5.62 mg/dL — ABNORMAL HIGH (ref 0.61–1.24)
GFR, Estimated: 10 mL/min — ABNORMAL LOW (ref >60)
Glucose, Bld: 156 mg/dL — ABNORMAL HIGH (ref 70–99)
Phosphorus: 3.1 mg/dL (ref 2.5–4.6)
Potassium: 3.7 mmol/L (ref 3.5–5.1)
Sodium: 136 mmol/L (ref 135–145)

## 2023-07-10 LAB — CBC WITH DIFFERENTIAL/PLATELET
Abs Immature Granulocytes: 0 10*3/uL (ref 0.00–0.07)
Basophils Absolute: 0.1 10*3/uL (ref 0.0–0.1)
Basophils Relative: 1 %
Eosinophils Absolute: 0.3 10*3/uL (ref 0.0–0.5)
Eosinophils Relative: 4 %
HCT: 36.2 % — ABNORMAL LOW (ref 39.0–52.0)
Hemoglobin: 11.3 g/dL — ABNORMAL LOW (ref 13.0–17.0)
Lymphocytes Relative: 11 %
Lymphs Abs: 0.8 10*3/uL (ref 0.7–4.0)
MCH: 25.3 pg — ABNORMAL LOW (ref 26.0–34.0)
MCHC: 31.2 g/dL (ref 30.0–36.0)
MCV: 81 fL (ref 80.0–100.0)
Monocytes Absolute: 0.4 10*3/uL (ref 0.1–1.0)
Monocytes Relative: 5 %
Neutro Abs: 5.8 10*3/uL (ref 1.7–7.7)
Neutrophils Relative %: 79 %
Platelets: 318 10*3/uL (ref 150–400)
RBC: 4.47 MIL/uL (ref 4.22–5.81)
RDW: 21.2 % — ABNORMAL HIGH (ref 11.5–15.5)
WBC: 7.3 10*3/uL (ref 4.0–10.5)
nRBC: 0 % (ref 0.0–0.2)
nRBC: 0 /100{WBCs}

## 2023-07-10 LAB — CBC
HCT: 35.6 % — ABNORMAL LOW (ref 39.0–52.0)
Hemoglobin: 10.7 g/dL — ABNORMAL LOW (ref 13.0–17.0)
MCH: 24.5 pg — ABNORMAL LOW (ref 26.0–34.0)
MCHC: 30.1 g/dL (ref 30.0–36.0)
MCV: 81.5 fL (ref 80.0–100.0)
Platelets: 332 10*3/uL (ref 150–400)
RBC: 4.37 MIL/uL (ref 4.22–5.81)
RDW: 21 % — ABNORMAL HIGH (ref 11.5–15.5)
WBC: 7.6 10*3/uL (ref 4.0–10.5)
nRBC: 0 % (ref 0.0–0.2)

## 2023-07-10 LAB — COMPREHENSIVE METABOLIC PANEL
ALT: 8 U/L (ref 0–44)
AST: 14 U/L — ABNORMAL LOW (ref 15–41)
Albumin: 2.7 g/dL — ABNORMAL LOW (ref 3.5–5.0)
Alkaline Phosphatase: 76 U/L (ref 38–126)
Anion gap: 13 (ref 5–15)
BUN: 37 mg/dL — ABNORMAL HIGH (ref 8–23)
CO2: 27 mmol/L (ref 22–32)
Calcium: 8.8 mg/dL — ABNORMAL LOW (ref 8.9–10.3)
Chloride: 93 mmol/L — ABNORMAL LOW (ref 98–111)
Creatinine, Ser: 5.7 mg/dL — ABNORMAL HIGH (ref 0.61–1.24)
GFR, Estimated: 10 mL/min — ABNORMAL LOW (ref >60)
Glucose, Bld: 154 mg/dL — ABNORMAL HIGH (ref 70–99)
Potassium: 3.8 mmol/L (ref 3.5–5.1)
Sodium: 133 mmol/L — ABNORMAL LOW (ref 135–145)
Total Bilirubin: 0.9 mg/dL (ref 0.0–1.2)
Total Protein: 6 g/dL — ABNORMAL LOW (ref 6.5–8.1)

## 2023-07-10 LAB — GLUCOSE, CAPILLARY
Glucose-Capillary: 167 mg/dL — ABNORMAL HIGH (ref 70–99)
Glucose-Capillary: 122 mg/dL — ABNORMAL HIGH (ref 70–99)
Glucose-Capillary: 136 mg/dL — ABNORMAL HIGH (ref 70–99)

## 2023-07-10 LAB — PHOSPHORUS: Phosphorus: 3.2 mg/dL (ref 2.5–4.6)

## 2023-07-10 MED ORDER — PENTAFLUOROPROP-TETRAFLUOROETH EX AERO: 1.0000 | INHALATION_SPRAY | CUTANEOUS | Status: DC | PRN

## 2023-07-10 MED ORDER — ALTEPLASE 2 MG IJ SOLR: 2.0000 mg | Freq: Once | INTRAMUSCULAR | Status: DC | PRN

## 2023-07-10 MED ORDER — ANTICOAGULANT SODIUM CITRATE 4% (200MG/5ML) IV SOLN: 5.0000 mL | Status: DC | PRN

## 2023-07-10 MED ORDER — HEPARIN SODIUM (PORCINE) 1000 UNIT/ML DIALYSIS: 1000.0000 [IU] | INTRAMUSCULAR | Status: DC | PRN

## 2023-07-10 MED ORDER — LIDOCAINE-PRILOCAINE 2.5-2.5 % EX CREA: 1.0000 | TOPICAL_CREAM | CUTANEOUS | Status: DC | PRN

## 2023-07-10 MED ORDER — LIDOCAINE HCL (PF) 1 % IJ SOLN: 5.0000 mL | INTRAMUSCULAR | Status: DC | PRN

## 2023-07-10 NOTE — Progress Notes (Signed)
[facility] Progress Note Patient: [name] ZOX:[id number] DOB: 1956/03/14 DOA: 06/27/2023  DOS: the patient was seen and examined on 07/10/2023  Brief Hospital Course: PMH of CAD s/p CABG, combined systolic and diastolic CHF, ESRD MWF, type 2 diabetes, hypertension, SVT, OSA, upper GI bleed who presented with slurred speech and AMS.  Pt moved into SNF about 9 months ago after a hospitalization. He was living along prior to that. He fractured his right hip last April and had declined and is now mostly bedbound and wheelchair bound. Caretaker last saw him well on 1/9. He was diagnosed with right sided pneumonia on 1/7.    Presented as a code stroke, CTA multiple stenosis.  MRI confirmed multiple scattered acute strokes.  Neurology was consulted.    For ESRD nephrology was consulted for HD.  ID was consulted for possible staph bacteremia. As the patient has poor mentation and continues to decline further, palliative care was consulted and family is leaning towards comfort.  Currently DNR. Mentation appears to be improving although patient still significantly fatigued.  Assessment and Plan: Acute ischemic bilateral hemispheres punctate infarct. CT head negative. CTA shows evidence of multiple stenosis. CT perfusion scan no penumbra. MRI brain confirmed multiple foci of scattered acute infarct in the bilateral cerebral hemisphere. Echocardiogram shows EF 45 to 50%.  LDL 24.  Hemoglobin A1c 8.1. Not on any antithrombotic therapy prior to admission Later on recommendation was aspirin and Plavix for 3 weeks followed by aspirin alone. Neuro signed off on 1/16. Mentation better since 1/23.  Still fatigued though.  Dysphagia. Speech therapy following. Secondary to stroke. On D2 diet thin liquid. Oral intake is 15 to 45%.  ESRD on HD.  MWF. Nephrology following. Will monitor progression.  So far appears to be tolerating HD.  MRSA bacteremia. Concern for CNS embolic process in  the setting of presentation with acute stroke as well as bacteremia. TEE was requested but since the family is leaning towards more comfort approach, currently canceled. Continue with IV antibiotics with dialysis. Appreciate ID consultation.  Acute encephalopathy, associated with a stroke as well as infection. Multifactorial. Less likely toxic. Most likely metabolic. Improving.  Monitor.  Anemia of chronic kidney disease. H&H stable. Receiving iron aspirin.  Chronic systolic and diastolic CHF. HTN. History of SVT. CAD SP CABG, cath 8/23 patent grafts. Echo in the past had EF of 35 to 40%. Echo this admission improved 45 to 50%. Clinically euvolemic.  Volume management with HD. Currently on Imdur and Toprol-XL. Also on amiodarone. On aspirin and Plavix. Blood pressure has been soft.  Will hold Imdur and reduce Toprol-XL to 12.5 mg.  OSA. On nighttime O2. Not on CPAP. Monitor.  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 Currently on sliding scale insulin.  Hypothyroidism. On Synthroid. TSH 11.6 on admission.  Neuropathy. On gabapentin.  Currently on hold.  Chronic pain syndrome. Patient is currently not on fentanyl patch. Currently on fentanyl as needed only. Has not received narcotics since 1/20.  Mood disorder. On Zoloft. Continue  GERD. On PPI twice daily.   Goals of care conversation. Family meeting today with palliative care. Family leaning towards comfort although currently no clear plans.  Subjective: Fatigue and tired.  Ongoing abdominal symptoms although tolerating oral diet.  No nausea no vomiting.  Physical Exam: In no distress. Clear to auscultation from Bowel sound present.  Nontender.  Data Reviewed: I have Reviewed nursing notes, Vitals, and Lab results. Discussed with  palliative care. Reviewed CBC and CMP.  Disposition: Status is: Inpatient Remains  inpatient appropriate because: Monitor for improvement in mentation and clarity on goals of care.  heparin injection 5,000 Units Start: 06/27/23 2200   Family Communication: No one at bedside Level of care: Telemetry Medical continue. Vitals:   07/10/23 1214 07/10/23 1216 07/10/23 1224 07/10/23 1438  BP: (!) 112/58 (!) 142/65 (!) 118/56 128/60  Pulse: 72 73 70 74  Resp: 18 12 15 18   Temp: 98 F (36.7 C)   98 F (36.7 C)  TempSrc:    Oral  SpO2: 100% 100% 97% 98%  Weight:      Height:         Author: Lynden Oxford, MD 07/10/2023 6:07 PM  Please look on [email or web address] to find out who is on call.

## 2023-07-10 NOTE — Progress Notes (Signed)
Spoke with Dr. Valentino Nose regarding being out of 3K bath for HD. He advise to use 4K bath for this HD visit.

## 2023-07-10 NOTE — Progress Notes (Signed)
Speech Language Pathology Treatment: Dysphagia  Patient Details Name: [name] MRN: [id number] DOB: [birth year] Today's Date: 07/10/2023 Time: 4098-1191 SLP Time Calculation (min) (ACUTE ONLY): 43 min  Assessment / Plan / Recommendation Clinical Impression  Assisted with pt's lunch meal after he returned from HD.  More alert and interactive today.  He attempted to hold spoon in left hand but required max physical assist for hand-to-mouth with poor initiative. SLP provided total assist for feeding. He consumed 60% of meal and made requests for specific items on tray.  Initiation and response time remain significantly delayed for all motor activities, including speech.  Mastication/oral phase was quite prolonged.  There was one incident of coughing with chopped meat, but he recovered quickly and tolerated the rest of his meal without difficulty. Anticipate that dysphagia 2/thin liquids will be the most functional diet for the time being.      HPI HPI: Pt is a 68 yo male presenting with slurred speech and AMS. MRI revealed multiple scattered foci of acute infarct in the bilateral cerebral hemispheres. Pt has been seen multiple times by SLP in the past with dysphagia suspected to be primarily of esophageal origin. He has been on pureed diets up to mechanical soft diets, always with thin liquids. PMH includes: recent PNA (dx 1/7), stroke (2022), esophageal ca (2022), HH, CAD s/p CABG, combined CHF, ESRD, DMII, HTN, SVT, OSA, UGIB. Pt bed and w/c bound since hip fx last April and now resides at [facility].      SLP Plan  Continue with current plan of care      Recommendations for follow up therapy are one component of a multi-disciplinary discharge planning process, led by the attending physician.  Recommendations may be updated based on patient status, additional functional criteria and insurance authorization.    Recommendations  Diet recommendations: Dysphagia 2 (fine chop);Thin liquid Liquids  provided via: Cup;Straw Medication Administration: Crushed with puree Supervision: Trained caregiver to feed patient Compensations: Minimize environmental distractions;Slow rate;Small sips/bites Postural Changes and/or Swallow Maneuvers: Seated upright 90 degrees                  Oral care BID   Frequent or constant Supervision/Assistance Dysphagia, unspecified (R13.10)     Continue with current plan of care    [name] L. [name] Frederic, MA CCC/SLP Clinical Specialist - Acute Care SLP Acute Rehabilitation Services Office number [phone]  [name] [name]  07/10/2023, 3:30 PM

## 2023-07-10 NOTE — Progress Notes (Signed)
[facility] Progress Note   Subjective:   Patient seen on dialysis with no complaints.  Objective Vitals:   07/10/23 0824 07/10/23 0856 07/10/23 0900 07/10/23 0930  BP: (!) 130/59 (!) 119/59 121/61 128/61  Pulse: 66 63 64 67  Resp: 17 13 14 11   Temp:      TempSrc:      SpO2: 97% 98% 99% 99%  Weight:      Height:       Physical Exam General: Frail appearing male, alert and in NAD Heart: Normal rate, no rub Lungs: Bilateral chest rise with no increased work of breathing Abdomen: Soft, non-distended, +BS Extremities: No edema b/l lower extremities Dialysis Access:  LUE AVF + t/b   Additional Objective Labs: Basic Metabolic Panel: Recent Labs  Lab 07/05/23 1119 07/07/23 0816 07/10/23 0646  NA 138 135 133*  K 3.9 3.5 3.8  CL 96* 94* 93*  CO2 22 26 27   GLUCOSE 146* 138* 154*  BUN 33* 30* 37*  CREATININE 5.08* 4.86* 5.70*  CALCIUM 8.9 9.0 8.8*  PHOS 4.5 3.7 3.2   Liver Function Tests: Recent Labs  Lab 07/05/23 1119 07/07/23 0816 07/10/23 0646  AST  --   --  14*  ALT  --   --  8  ALKPHOS  --   --  76  BILITOT  --   --  0.9  PROT  --   --  6.0*  ALBUMIN 2.9* 2.8* 2.7*   No results for input(s): "LIPASE", "AMYLASE" in the last 168 hours. CBC: Recent Labs  Lab 07/05/23 1119 07/07/23 0816 07/10/23 0646  WBC 7.6 9.0 7.3  NEUTROABS 5.7  --  5.8  HGB 12.5* 11.9* 11.3*  HCT 41.4 39.6 36.2*  MCV 81.2 80.8 81.0  PLT 304 342 318   Blood Culture    Component Value Date/Time   SDES BLOOD LEFT HAND 07/02/2023 1046   SPECREQUEST  07/02/2023 1046    BOTTLES DRAWN AEROBIC AND ANAEROBIC Blood Culture adequate volume   CULT  07/02/2023 1046    NO GROWTH 5 DAYS Performed at [facility], [address], [place], [place] [address]    REPTSTATUS 07/07/2023 FINAL 07/02/2023 1046    Cardiac Enzymes: No results for input(s): "CKTOTAL", "CKMB", "CKMBINDEX", "TROPONINI" in the last 168 hours. CBG: Recent Labs  Lab 07/08/23 1147  07/08/23 1827 07/09/23 0011 07/09/23 0643 07/10/23 0637  GLUCAP 217* 174* 148* 129* 167*   Iron Studies: No results for input(s): "IRON", "TIBC", "TRANSFERRIN", "FERRITIN" in the last 72 hours. @lablastinr3 @ Studies/Results: DG Abd Portable 1V Result Date: 07/08/2023 CLINICAL DATA:  Nausea EXAM: PORTABLE ABDOMEN - 1 VIEW COMPARISON:  09/13/2018 FINDINGS: Scattered large and small bowel gas is noted. No free air is seen. No constipation or obstructive changes are noted. Mild retained fecal material is noted within the rectal vault. Postsurgical changes in the right hip are seen. IMPRESSION: Mild retained fecal material within the rectal wall. Electronically Signed   By: Alcide Clever M.D.   On: 07/08/2023 18:59   Medications:  anticoagulant sodium citrate     vancomycin Stopped (07/07/23 1305)     stroke: early stages of recovery book   Does not apply Once   amiodarone  200 mg Oral BID   aspirin EC  81 mg Oral Daily   bisacodyl  10 mg Rectal Once   calcium acetate  667 mg Oral 3 times per day on Sunday Tuesday Thursday Saturday   And   calcium acetate  667 mg Oral 2 times per day on Monday Wednesday Friday   Chlorhexidine Gluconate Cloth  6 each Topical Q0600   Chlorhexidine Gluconate Cloth  6 each Topical Q0600   clopidogrel  75 mg Oral Daily   feeding supplement  237 mL Oral BID BM   heparin  5,000 Units Subcutaneous Q8H   levothyroxine  50 mcg Oral Daily   metoprolol succinate  12.5 mg Oral Daily   mupirocin ointment   Nasal BID   mouth rinse  15 mL Mouth Rinse 4 times per day   pantoprazole  40 mg Oral BID   sertraline  100 mg Oral Daily    Dialysis Orders: [facility] MWF 4:00 400/600 EDW 75.3kg 2K/2.5Ca AVF Hep 5000 Last HD 1/10.  Mircera 200 q 2wks (last 06/16/23) Hectorol 1mg  TIW  Assessment/Plan: 1.Acute CVA - multiple scattered infarcts on MRI.  Per neurology  2.Acute encephalopathy with aphasia- likely multifactorial, improving somewhat 3.MRSA bacteremia on BCx 1/14.  Repeat cultures neg to date.  On IV Vanc. ID following.  4.ESRD -  HD MWF. Cont on schedule using AVF.  5.Hypertension/volume  -  BP ok. No volume on exam. Below prior EDW, minimal PO intake 6.Anemia  - Hgb at goal.   Aranesp 100 mcg given on  1/20.  7.Metabolic bone disease -  Calcium and P acceptable. Continue home meds when tolerating po  8. H/o SVT - on amiodarone  9. T2DM - per primary  10. GOC - Palliative care consulted - Code status now DNR/DNI. Not eating much and refused meds today. Ongoing GOC discussions

## 2023-07-10 NOTE — Procedures (Signed)
I was present at this dialysis session. I have reviewed the session itself and made appropriate changes.   Filed Weights   07/10/23 0818  Weight: 72.2 kg    Recent Labs  Lab 07/10/23 0646  NA 133*  K 3.8  CL 93*  CO2 27  GLUCOSE 154*  BUN 37*  CREATININE 5.70*  CALCIUM 8.8*  PHOS 3.2    Recent Labs  Lab 07/05/23 1119 07/07/23 0816 07/10/23 0646  WBC 7.6 9.0 7.3  NEUTROABS 5.7  --  5.8  HGB 12.5* 11.9* 11.3*  HCT 41.4 39.6 36.2*  MCV 81.2 80.8 81.0  PLT 304 342 318    Scheduled Meds:   stroke: early stages of recovery book   Does not apply Once   amiodarone  200 mg Oral BID   aspirin EC  81 mg Oral Daily   bisacodyl  10 mg Rectal Once   calcium acetate  667 mg Oral 3 times per day on Sunday Tuesday Thursday Saturday   And   calcium acetate  667 mg Oral 2 times per day on Monday Wednesday Friday   Chlorhexidine Gluconate Cloth  6 each Topical Q0600   Chlorhexidine Gluconate Cloth  6 each Topical Q0600   clopidogrel  75 mg Oral Daily   feeding supplement  237 mL Oral BID BM   heparin  5,000 Units Subcutaneous Q8H   levothyroxine  50 mcg Oral Daily   metoprolol succinate  12.5 mg Oral Daily   mupirocin ointment   Nasal BID   mouth rinse  15 mL Mouth Rinse 4 times per day   pantoprazole  40 mg Oral BID   sertraline  100 mg Oral Daily   Continuous Infusions:  anticoagulant sodium citrate     vancomycin Stopped (07/07/23 1305)   PRN Meds:.acetaminophen **OR** acetaminophen (TYLENOL) oral liquid 160 mg/5 mL **OR** acetaminophen, alteplase, anticoagulant sodium citrate, fentaNYL (SUBLIMAZE) injection, heparin, lidocaine (PF), lidocaine-prilocaine, mouth rinse, pentafluoroprop-tetrafluoroeth, senna-docusate   Louie Bun,  MD 07/10/2023, 9:55 AM

## 2023-07-10 NOTE — Plan of Care (Signed)
  Problem: Education: Goal: Knowledge of disease or condition will improve Outcome: Progressing Goal: Knowledge of secondary prevention will improve (MUST DOCUMENT ALL) Outcome: Progressing Goal: Knowledge of patient specific risk factors will improve [name] N/A or DELETE if not current risk factor) Outcome: Progressing

## 2023-07-10 NOTE — Plan of Care (Signed)
  Problem: Education: Goal: Knowledge of disease or condition will improve Outcome: Progressing Goal: Knowledge of secondary prevention will improve (MUST DOCUMENT ALL) Outcome: Progressing Goal: Knowledge of patient specific risk factors will improve Loraine Leriche N/A or DELETE if not current risk factor) Outcome: Progressing   Problem: Ischemic Stroke/TIA Tissue Perfusion: Goal: Complications of ischemic stroke/TIA will be minimized Outcome: Progressing   Problem: Coping: Goal: Will verbalize positive feelings about self Outcome: Progressing Goal: Will identify appropriate support needs Outcome: Progressing   Problem: Health Behavior/Discharge Planning: Goal: Ability to manage health-related needs will improve Outcome: Progressing Goal: Goals will be collaboratively established with patient/family Outcome: Progressing   Problem: Self-Care: Goal: Ability to participate in self-care as condition permits will improve Outcome: Progressing Goal: Verbalization of feelings and concerns over difficulty with self-care will improve Outcome: Progressing Goal: Ability to communicate needs accurately will improve Outcome: Progressing   Problem: Nutrition: Goal: Risk of aspiration will decrease Outcome: Progressing Goal: Dietary intake will improve Outcome: Progressing   Problem: Education: Goal: Ability to describe self-care measures that may prevent or decrease complications (Diabetes Survival Skills Education) will improve Outcome: Progressing   Problem: Coping: Goal: Ability to adjust to condition or change in health will improve Outcome: Progressing   Problem: Fluid Volume: Goal: Ability to maintain a balanced intake and output will improve Outcome: Progressing   Problem: Health Behavior/Discharge Planning: Goal: Ability to identify and utilize available resources and services will improve Outcome: Progressing Goal: Ability to manage health-related needs will improve Outcome:  Progressing   Problem: Metabolic: Goal: Ability to maintain appropriate glucose levels will improve Outcome: Progressing   Problem: Nutritional: Goal: Maintenance of adequate nutrition will improve Outcome: Progressing Goal: Progress toward achieving an optimal weight will improve Outcome: Progressing   Problem: Skin Integrity: Goal: Risk for impaired skin integrity will decrease Outcome: Progressing   Problem: Tissue Perfusion: Goal: Adequacy of tissue perfusion will improve Outcome: Progressing   Problem: Education: Goal: Knowledge of General Education information will improve Description: Including pain rating scale, medication(s)/side effects and non-pharmacologic comfort measures Outcome: Progressing   Problem: Health Behavior/Discharge Planning: Goal: Ability to manage health-related needs will improve Outcome: Progressing   Problem: Clinical Measurements: Goal: Ability to maintain clinical measurements within normal limits will improve Outcome: Progressing Goal: Will remain free from infection Outcome: Progressing Goal: Diagnostic test results will improve Outcome: Progressing Goal: Respiratory complications will improve Outcome: Progressing Goal: Cardiovascular complication will be avoided Outcome: Progressing   Problem: Activity: Goal: Risk for activity intolerance will decrease Outcome: Progressing   Problem: Nutrition: Goal: Adequate nutrition will be maintained Outcome: Progressing   Problem: Coping: Goal: Level of anxiety will decrease Outcome: Progressing   Problem: Elimination: Goal: Will not experience complications related to bowel motility Outcome: Progressing Goal: Will not experience complications related to urinary retention Outcome: Progressing   Problem: Pain Managment: Goal: General experience of comfort will improve and/or be controlled Outcome: Progressing   Problem: Safety: Goal: Ability to remain free from injury will  improve Outcome: Progressing   Problem: Skin Integrity: Goal: Risk for impaired skin integrity will decrease Outcome: Progressing

## 2023-07-10 NOTE — Progress Notes (Signed)
Palliative:  HPI:  68 y.o. male  with past medical history of stroke, CAD s/p CABG, HFmrEF, ESRD on HD MWF, diabetes, HTN, SVT, OSA, upper GIB, esophageal cancer 2022, right eye vision loss, s/p hip fracture s/p repair admitted on 06/27/2023 with slurred speech and AMS due to stroke, uremia (missed HD), and new stroke.    I met today with Ausar's 4 siblings including Amy who is HCPOA. We spent time reviewing Trent's admission and progression throughout his hospital stay. We discussed in detail his challenges to improvement including swallowing, intake, refusing medications, and dialysis. He receives much more support in the hospital and dialysis is much easier to complete. He already had a lot of chronic pain but is no longer on his pain medications - I do anticipate that if dialysis is continued he would likely need more pain medication which would further impact his mentation and awareness. We discussed all possibilities and disposition options. Unfortunately options are limited. If dialysis is desired to continue he will need to return to facility and we all agree that he will not thrive there. We discussed if comfort focused care is pursued the options of hospice at home vs hospice facility. We discussed these options in detail.   We all met together with Lloyd Huger. Jhoel is more awake and interactive today. We were able to have a conversation about his worsening health issues. We reviewed with him our concerns moving forward and challenges. We attempted to elicit from Jamaree his wishes. He struggles to fully express his wishes. He does share that he is not afraid to die and tells [place] that he made his peace with dying a while ago. He does tell us that he wants to go home. We did discuss the option of going home. Family is willing to help care for him at home with the help of hospice. We attempted to discuss what going home would be and look like and if he still wishes to go home. Unfortunately even after multiple  attempts I believe he was overwhelmed, tired, and having pain requiring pain medication which kept him from being able to make a full decision. We agree to continue to think about this and allow Isidro more time to consider and express his wishes.   We agree to continue conversation tomorrow. All questions/concerns addressed.   Exam: More alert today. Able to converse better. Struggles to express. Breathing regular, unlabored. Abd soft. Warm to touch.   Plan: - DNR/DNI - No feeding tube - Decision to continue current care vs transition to hospice at home still under discussion  130 min  Yong Channel, NP Palliative Medicine Team Pager [phone] (Please see [email or web address] for schedule) Team Phone [phone]

## 2023-07-11 DIAGNOSIS — I639 Cerebral infarction, unspecified: Secondary | ICD-10-CM | POA: Diagnosis not present

## 2023-07-11 DIAGNOSIS — Z7189 Other specified counseling: Secondary | ICD-10-CM | POA: Diagnosis not present

## 2023-07-11 DIAGNOSIS — Z515 Encounter for palliative care: Secondary | ICD-10-CM | POA: Diagnosis not present

## 2023-07-11 DIAGNOSIS — N186 End stage renal disease: Secondary | ICD-10-CM | POA: Diagnosis not present

## 2023-07-11 LAB — GLUCOSE, CAPILLARY
Glucose-Capillary: 162 mg/dL — ABNORMAL HIGH (ref 70–99)
Glucose-Capillary: 156 mg/dL — ABNORMAL HIGH (ref 70–99)

## 2023-07-11 MED ORDER — METOPROLOL TARTRATE 25 MG/10 ML ORAL SUSPENSION
6.2500 mg | Freq: Two times a day (BID) | ORAL | Status: DC
  Administered 2023-07-12 – 2023-07-14 (×4): 6.25 mg via ORAL
  Filled 2023-07-11 (×6): qty 5

## 2023-07-11 MED ORDER — FAMOTIDINE 20 MG PO TABS
20.0000 mg | ORAL_TABLET | Freq: Every day | ORAL | Status: DC
  Administered 2023-07-12 – 2023-07-14 (×3): 20 mg via ORAL
  Filled 2023-07-11 (×3): qty 1

## 2023-07-11 MED ORDER — ASPIRIN 81 MG PO CHEW
81.0000 mg | CHEWABLE_TABLET | Freq: Every day | ORAL | Status: DC
  Administered 2023-07-12 – 2023-07-14 (×3): 81 mg via ORAL
  Filled 2023-07-11 (×3): qty 1

## 2023-07-11 MED ORDER — FAMOTIDINE 20 MG PO TABS: 20.0000 mg | ORAL_TABLET | Freq: Two times a day (BID) | ORAL | Status: DC

## 2023-07-11 NOTE — Plan of Care (Signed)
  Problem: Education: Goal: Knowledge of disease or condition will improve 07/11/2023 0614 by Dahlia Bailiff, RN Outcome: Progressing 07/10/2023 2034 by Dahlia Bailiff, RN Outcome: Progressing Goal: Knowledge of secondary prevention will improve (MUST DOCUMENT ALL) 07/11/2023 0614 by Dahlia Bailiff, RN Outcome: Progressing 07/10/2023 2034 by Dahlia Bailiff, RN Outcome: Progressing Goal: Knowledge of patient specific risk factors will improve Loraine Leriche N/A or DELETE if not current risk factor) 07/11/2023 0614 by Dahlia Bailiff, RN Outcome: Progressing 07/10/2023 2034 by Dahlia Bailiff, RN Outcome: Progressing

## 2023-07-11 NOTE — Plan of Care (Signed)
  Problem: Education: Goal: Knowledge of disease or condition will improve Outcome: Progressing Goal: Knowledge of secondary prevention will improve (MUST DOCUMENT ALL) Outcome: Progressing Goal: Knowledge of patient specific risk factors will improve Loraine Leriche N/A or DELETE if not current risk factor) Outcome: Progressing   Problem: Ischemic Stroke/TIA Tissue Perfusion: Goal: Complications of ischemic stroke/TIA will be minimized Outcome: Progressing   Problem: Coping: Goal: Will verbalize positive feelings about self Outcome: Progressing Goal: Will identify appropriate support needs Outcome: Progressing   Problem: Health Behavior/Discharge Planning: Goal: Ability to manage health-related needs will improve Outcome: Progressing Goal: Goals will be collaboratively established with patient/family Outcome: Progressing   Problem: Self-Care: Goal: Ability to participate in self-care as condition permits will improve Outcome: Progressing Goal: Verbalization of feelings and concerns over difficulty with self-care will improve Outcome: Progressing Goal: Ability to communicate needs accurately will improve Outcome: Progressing   Problem: Nutrition: Goal: Risk of aspiration will decrease Outcome: Progressing Goal: Dietary intake will improve Outcome: Progressing   Problem: Education: Goal: Ability to describe self-care measures that may prevent or decrease complications (Diabetes Survival Skills Education) will improve Outcome: Progressing   Problem: Coping: Goal: Ability to adjust to condition or change in health will improve Outcome: Progressing   Problem: Fluid Volume: Goal: Ability to maintain a balanced intake and output will improve Outcome: Progressing   Problem: Health Behavior/Discharge Planning: Goal: Ability to identify and utilize available resources and services will improve Outcome: Progressing Goal: Ability to manage health-related needs will improve Outcome:  Progressing   Problem: Metabolic: Goal: Ability to maintain appropriate glucose levels will improve Outcome: Progressing   Problem: Nutritional: Goal: Maintenance of adequate nutrition will improve Outcome: Progressing Goal: Progress toward achieving an optimal weight will improve Outcome: Progressing   Problem: Skin Integrity: Goal: Risk for impaired skin integrity will decrease Outcome: Progressing   Problem: Tissue Perfusion: Goal: Adequacy of tissue perfusion will improve Outcome: Progressing   Problem: Education: Goal: Knowledge of General Education information will improve Description: Including pain rating scale, medication(s)/side effects and non-pharmacologic comfort measures Outcome: Progressing   Problem: Health Behavior/Discharge Planning: Goal: Ability to manage health-related needs will improve Outcome: Progressing   Problem: Clinical Measurements: Goal: Ability to maintain clinical measurements within normal limits will improve Outcome: Progressing Goal: Will remain free from infection Outcome: Progressing Goal: Diagnostic test results will improve Outcome: Progressing Goal: Respiratory complications will improve Outcome: Progressing Goal: Cardiovascular complication will be avoided Outcome: Progressing   Problem: Activity: Goal: Risk for activity intolerance will decrease Outcome: Progressing   Problem: Nutrition: Goal: Adequate nutrition will be maintained Outcome: Progressing   Problem: Coping: Goal: Level of anxiety will decrease Outcome: Progressing   Problem: Elimination: Goal: Will not experience complications related to bowel motility Outcome: Progressing Goal: Will not experience complications related to urinary retention Outcome: Progressing   Problem: Pain Managment: Goal: General experience of comfort will improve and/or be controlled Outcome: Progressing   Problem: Safety: Goal: Ability to remain free from injury will  improve Outcome: Progressing   Problem: Skin Integrity: Goal: Risk for impaired skin integrity will decrease Outcome: Progressing

## 2023-07-11 NOTE — Progress Notes (Signed)
[facility] Progress Note   Subjective:   Patient minimally conversant but no complaints today.  Objective Vitals:   07/10/23 2025 07/10/23 2351 07/11/23 0345 07/11/23 0822  BP: (!) 100/56 137/66 (!) 143/65 133/69  Pulse: 68 71 70 70  Resp: 18 18 18 17   Temp: 98.5 F (36.9 C) 98.7 F (37.1 C) 98.1 F (36.7 C) 98.5 F (36.9 C)  TempSrc: Oral Oral Oral Oral  SpO2: 95% 95% 98% 97%  Weight:      Height:       Physical Exam General: Frail appearing male, NAD Heart: Normal rate, no rub Lungs: Bilateral chest rise with no increased work of breathing Abdomen: Soft, non-distended, +BS Extremities: No edema b/l lower extremities Dialysis Access:  LUE AVF + t/b   Additional Objective Labs: Basic Metabolic Panel: Recent Labs  Lab 07/07/23 0816 07/10/23 0646 07/10/23 0903  NA 135 133* 136  K 3.5 3.8 3.7  CL 94* 93* 98  CO2 26 27 26   GLUCOSE 138* 154* 156*  BUN 30* 37* 38*  CREATININE 4.86* 5.70* 5.62*  CALCIUM 9.0 8.8* 8.9  PHOS 3.7 3.2 3.1   Liver Function Tests: Recent Labs  Lab 07/07/23 0816 07/10/23 0646 07/10/23 0903  AST  --  14*  --   ALT  --  8  --   ALKPHOS  --  76  --   BILITOT  --  0.9  --   PROT  --  6.0*  --   ALBUMIN 2.8* 2.7* 2.6*   No results for input(s): "LIPASE", "AMYLASE" in the last 168 hours. CBC: Recent Labs  Lab 07/05/23 1119 07/07/23 0816 07/10/23 0646 07/10/23 0902  WBC 7.6 9.0 7.3 7.6  NEUTROABS 5.7  --  5.8  --   HGB 12.5* 11.9* 11.3* 10.7*  HCT 41.4 39.6 36.2* 35.6*  MCV 81.2 80.8 81.0 81.5  PLT 304 342 318 332   Blood Culture    Component Value Date/Time   SDES BLOOD LEFT HAND 07/02/2023 1046   SPECREQUEST  07/02/2023 1046    BOTTLES DRAWN AEROBIC AND ANAEROBIC Blood Culture adequate volume   CULT  07/02/2023 1046    NO GROWTH 5 DAYS Performed at [facility], [address], [place], [place] [address]    REPTSTATUS 07/07/2023 FINAL 07/02/2023 1046    Cardiac Enzymes: No results for  input(s): "CKTOTAL", "CKMB", "CKMBINDEX", "TROPONINI" in the last 168 hours. CBG: Recent Labs  Lab 07/09/23 0011 07/09/23 0643 07/10/23 0637 07/10/23 1311 07/10/23 1437  GLUCAP 148* 129* 167* 122* 136*   Iron Studies: No results for input(s): "IRON", "TIBC", "TRANSFERRIN", "FERRITIN" in the last 72 hours. @lablastinr3 @ Studies/Results: No results found.  Medications:  vancomycin 750 mg (07/10/23 1050)    amiodarone  200 mg Oral BID   aspirin EC  81 mg Oral Daily   bisacodyl  10 mg Rectal Once   calcium acetate  667 mg Oral 3 times per day on Sunday Tuesday Thursday Saturday   And   calcium acetate  667 mg Oral 2 times per day on Monday Wednesday Friday   Chlorhexidine Gluconate Cloth  6 each Topical Q0600   Chlorhexidine Gluconate Cloth  6 each Topical Q0600   clopidogrel  75 mg Oral Daily   feeding supplement  237 mL Oral BID BM   heparin  5,000 Units Subcutaneous Q8H   levothyroxine  50 mcg Oral Daily   metoprolol succinate  12.5 mg Oral Daily   mupirocin ointment   Nasal  BID   mouth rinse  15 mL Mouth Rinse 4 times per day   pantoprazole  40 mg Oral BID   sertraline  100 mg Oral Daily    Dialysis Orders: [facility] MWF 4:00 400/600 EDW 75.3kg 2K/2.5Ca AVF Hep 5000 Last HD 1/10.  Mircera 200 q 2wks (last 06/16/23) Hectorol 1mg  TIW  Assessment/Plan: 1.Acute CVA - multiple scattered infarcts on MRI.  Per neurology  2.Acute encephalopathy with aphasia- likely multifactorial, seems to wax and wane 3.MRSA bacteremia on BCx 1/14. Repeat cultures neg to date.  Antibiotics per ID and primary team 4.ESRD -  HD MWF. Cont on schedule using AVF.  5.Hypertension/volume  -  BP ok.  Losing weight.  Adjust EDW as needed 6.Anemia  - Hgb at goal.   Aranesp 100 mcg given on  1/20.  7.Metabolic bone disease -  Calcium and P acceptable. Continue home meds when taking food by mouth 8. H/o SVT - on amiodarone  9. T2DM - per primary  10. GOC - Palliative care consulted - Code status now  DNR/DNI. Not eating much and refused meds today. Ongoing GOC discussions

## 2023-07-11 NOTE — Plan of Care (Signed)
Alert, oriented x2. Discharge planning with family and Palliative Medicine team completed today.  Plan to discharge home with hospice tomorrow after hemodialysis.    Problem: Education: Goal: Knowledge of disease or condition will improve Outcome: Progressing Goal: Knowledge of secondary prevention will improve (MUST DOCUMENT ALL) Outcome: Progressing Goal: Knowledge of patient specific risk factors will improve [name] N/A or DELETE if not current risk factor) Outcome: Progressing   Problem: Ischemic Stroke/TIA Tissue Perfusion: Goal: Complications of ischemic stroke/TIA will be minimized Outcome: Progressing   Problem: Coping: Goal: Will verbalize positive feelings about self Outcome: Progressing Goal: Will identify appropriate support needs Outcome: Progressing   Problem: Health Behavior/Discharge Planning: Goal: Ability to manage health-related needs will improve Outcome: Progressing Goal: Goals will be collaboratively established with patient/family Outcome: Progressing

## 2023-07-11 NOTE — Progress Notes (Signed)
[facility] Progress Note Patient: [name] ZOX:[id number] DOB: Aug 06, 1955 DOA: 06/27/2023  DOS: the patient was seen and examined on 07/11/2023  Brief Hospital Course: PMH of CAD s/p CABG, combined systolic and diastolic CHF, ESRD MWF, type 2 diabetes, hypertension, SVT, OSA, upper GI bleed who presented with slurred speech and AMS.  Pt moved into SNF about 9 months ago after a hospitalization. He was living along prior to that. He fractured his right hip last April and had declined and is now mostly bedbound and wheelchair bound. Caretaker last saw him well on 1/9. He was diagnosed with right sided pneumonia on 1/7.    Presented as a code stroke, CTA multiple stenosis.  MRI confirmed multiple scattered acute strokes.  Neurology was consulted.    For ESRD nephrology was consulted for HD.  ID was consulted for possible staph bacteremia.  Currently on IV antibiotics in the hospital.  TEE was canceled as goal remains to be focusing on comfort going forward on discharge.  Based on the conversation between palliative care and the family, goal is to arrange home with hospice without hemodialysis.  Continue hemodialysis in the hospital while hospice is arranged at home.  [facility] will be accepting the patient.  Awaiting DME arrangements.  Assessment and Plan: Acute ischemic bilateral hemispheres punctate infarct. CT head negative. CTA shows evidence of multiple stenosis. CT perfusion scan no penumbra. MRI brain confirmed multiple foci of scattered acute infarct in the bilateral cerebral hemisphere. Echocardiogram shows EF 45 to 50%.  LDL 24.  Hemoglobin A1c 8.1. Not on any antithrombotic therapy prior to admission Later on recommendation was aspirin and Plavix for 3 weeks followed by aspirin alone. Neuro signed off on 1/16. Mentation better since 1/23.  Still fatigued though.  Dysphagia. Speech therapy following. Secondary to stroke. On D2 diet thin  liquid. Oral intake is 15 to 45%.  ESRD on HD.  MWF. Nephrology following.  HD in the hospital until patient is discharged from the hospital on hospice.  Patient and family would like to stop HD after discharge.  MRSA bacteremia. Concern for CNS embolic process in the setting of presentation with acute stroke as well as bacteremia. TEE was requested but since the family is leaning towards more comfort approach, currently canceled. Continue with IV vancomycin with dialysis. Appreciate ID consultation.  Acute encephalopathy, associated with a stroke as well as infection. Multifactorial. Toxic in the setting of morphine use as well as metabolic in the setting of MRSA infection with bacteremia and missing HD. Improving.  Monitor.  Anemia of chronic kidney disease. H&H stable. Receiving iron  Chronic systolic and diastolic CHF. HTN. History of SVT. CAD SP CABG, cath 8/23 patent grafts. Echo in the past had EF of 35 to 40%. Echo this admission improved 45 to 50%. Clinically euvolemic.  Volume management with HD. Currently on Imdur and Toprol-XL. Also on amiodarone. On aspirin and Plavix. Blood pressure has been soft.  Will hold Imdur and reduce Toprol-XL to 12.5 mg.  OSA. On nighttime O2. Not on CPAP. Monitor.  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 Currently on sliding scale insulin.  Hypothyroidism. On Synthroid. TSH 11.6 on admission.  Neuropathy. On gabapentin.  Currently on hold.  Chronic pain syndrome. Patient is currently not on fentanyl patch. Currently on fentanyl as needed only. Has not received narcotics since 1/20.  Mood disorder. On Zoloft. Continue  GERD. On PPI twice daily.   Goals of  care conversation. Family meeting today with palliative care. Family leaning towards comfort although currently no clear plans. Current plan is to focus on comfort after  discharge. Transition to home with hospice with [facility]. Awaiting DME arrangements. While hospice is being arranged at home and the patient is still in the hospital family would like to continue HD to allow patient to be at home in best possible condition for hospice initiation.  Subjective: Unchanged fatigue.  No nausea no vomiting.  No fever no chills.  Physical Exam: Aortic systolic murmur. No distress. Clear to auscultation. Bowel sound present.  Nontender.  Data Reviewed: I have Reviewed nursing notes, Vitals, and Lab results. Discussed with palliative care as well as nephrology.  Disposition: Status is: Inpatient Remains inpatient appropriate because: Awaiting hospice arrangement at home.  heparin injection 5,000 Units Start: 06/27/23 2200   Family Communication: No one at bedside Level of care: Telemetry Medical continue. Vitals:   07/11/23 0345 07/11/23 0822 07/11/23 1112 07/11/23 1601  BP: (!) 143/65 133/69 (!) 145/66 (!) 141/65  Pulse: 70 70 69 69  Resp: 18 17 18 18   Temp: 98.1 F (36.7 C) 98.5 F (36.9 C) 98.1 F (36.7 C) 98.4 F (36.9 C)  TempSrc: Oral Oral Oral Oral  SpO2: 98% 97% 99% 100%  Weight:      Height:         Author: Lynden Oxford, MD 07/11/2023 5:52 PM  Please look on [email or web address] to find out who is on call.
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
## 2023-07-11 NOTE — Progress Notes (Addendum)
Palliative:  HPI:  68 y.o. male  with past medical history of stroke, CAD s/p CABG, HFmrEF, ESRD on HD MWF, diabetes, HTN, SVT, OSA, upper GIB, esophageal cancer 2022, right eye vision loss, s/p hip fracture s/p repair admitted on 06/27/2023 with slurred speech and AMS due to stroke, uremia (missed HD), and new stroke.    I met today with Lloyd Huger, significant other [name], and HCPOA/sister Amy. Marisol is still awake and interactive. He only ate a few bites of breakfast. Amy and [name] report he has been engaging in conversation with them for > 1 hour now. Amy reviewed with Rayven again his options. Dudley tells [place] that he wants to go home but continue dialysis. Everyone explained that family cannot care for him at home and get him to and from dialysis. We discussed the difficult and unfair decision he is facing. We reviewed home with comfort care and hospice vs return to facility and continuing current care. Ultimately Tyjay is able to share that he wants to go home even if this means stopping dialysis. Family are saddened but supportive of this decision. They have been frustrated with care at facility and they are aware that Pellegrino needs more care now than he did prior to this admission. We discussed plan to go home with hospice. Amy would like for him to go home after dialysis tomorrow and if this is not possible they would like to wait until Friday and go home after dialysis then. Family want him to be optimized to be able to enjoy as much time at home as possible.   All questions/concerns addressed. Emotional support provided. Discussed with Dr. Allena Katz, Dr. Valentino Nose, RN, TOC Harvin Hazel.   Plan: - DNR/DNI - No feeding tube - Hopeful for home with hospice tomorrow after HD (if unable to go home tomorrow after HD plan is home Friday after HD) - May send home with low dose roxanol SL (2.5-5 mg q3h PRN) for pain/shortness of breath  50 min  Yong Channel, NP Palliative Medicine Team Pager [phone] (Please see  [email or web address] for schedule) Team Phone [phone]

## 2023-07-12 DIAGNOSIS — Z7189 Other specified counseling: Secondary | ICD-10-CM | POA: Diagnosis not present

## 2023-07-12 DIAGNOSIS — Z515 Encounter for palliative care: Secondary | ICD-10-CM | POA: Diagnosis not present

## 2023-07-12 DIAGNOSIS — N186 End stage renal disease: Secondary | ICD-10-CM | POA: Diagnosis not present

## 2023-07-12 DIAGNOSIS — I639 Cerebral infarction, unspecified: Secondary | ICD-10-CM | POA: Diagnosis not present

## 2023-07-12 LAB — RENAL FUNCTION PANEL
Albumin: 2.7 g/dL — ABNORMAL LOW (ref 3.5–5.0)
Anion gap: 15 (ref 5–15)
BUN: 32 mg/dL — ABNORMAL HIGH (ref 8–23)
CO2: 25 mmol/L (ref 22–32)
Calcium: 8.8 mg/dL — ABNORMAL LOW (ref 8.9–10.3)
Chloride: 95 mmol/L — ABNORMAL LOW (ref 98–111)
Creatinine, Ser: 4.78 mg/dL — ABNORMAL HIGH (ref 0.61–1.24)
GFR, Estimated: 13 mL/min — ABNORMAL LOW (ref >60)
Glucose, Bld: 152 mg/dL — ABNORMAL HIGH (ref 70–99)
Phosphorus: 3.2 mg/dL (ref 2.5–4.6)
Potassium: 3.9 mmol/L (ref 3.5–5.1)
Sodium: 135 mmol/L (ref 135–145)

## 2023-07-12 LAB — CBC WITH DIFFERENTIAL/PLATELET
Abs Immature Granulocytes: 0 10*3/uL (ref 0.00–0.07)
Basophils Absolute: 0.1 10*3/uL (ref 0.0–0.1)
Basophils Relative: 1 %
Eosinophils Absolute: 0.1 10*3/uL (ref 0.0–0.5)
Eosinophils Relative: 2 %
HCT: 38.7 % — ABNORMAL LOW (ref 39.0–52.0)
Hemoglobin: 11.9 g/dL — ABNORMAL LOW (ref 13.0–17.0)
Lymphocytes Relative: 9 %
Lymphs Abs: 0.6 10*3/uL — ABNORMAL LOW (ref 0.7–4.0)
MCH: 25.3 pg — ABNORMAL LOW (ref 26.0–34.0)
MCHC: 30.7 g/dL (ref 30.0–36.0)
MCV: 82.2 fL (ref 80.0–100.0)
Monocytes Absolute: 0.3 10*3/uL (ref 0.1–1.0)
Monocytes Relative: 4 %
Neutro Abs: 6 10*3/uL (ref 1.7–7.7)
Neutrophils Relative %: 84 %
Platelets: 344 10*3/uL (ref 150–400)
RBC: 4.71 MIL/uL (ref 4.22–5.81)
RDW: 21.3 % — ABNORMAL HIGH (ref 11.5–15.5)
WBC: 7.1 10*3/uL (ref 4.0–10.5)
nRBC: 0 % (ref 0.0–0.2)
nRBC: 0 /100{WBCs}

## 2023-07-12 LAB — GLUCOSE, CAPILLARY
Glucose-Capillary: 143 mg/dL — ABNORMAL HIGH (ref 70–99)
Glucose-Capillary: 131 mg/dL — ABNORMAL HIGH (ref 70–99)
Glucose-Capillary: 160 mg/dL — ABNORMAL HIGH (ref 70–99)

## 2023-07-12 NOTE — Progress Notes (Signed)
Pharmacy Antibiotic Note  [name] is a 68 y.o. male admitted on 06/27/2023 with  altered mental status with CVA, now found to have Staphylococcus aureus bacteremia .  Pharmacy has been consulted for vancomycin dosing.  Cultures growing methicillin resistant staphylococcus aureus. 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 TEE was declined by patient/family. Currently, plan is to discharge on 07/14/23 with home hospice/comfort care.  Plan: Cont vancomycin 750 mg IV every HD MWF Will hold off on level, unless plan of care changes  Height: 5\' 10"  (177.8 cm) Weight: 72.2 kg (159 lb 2.8 oz) IBW/kg (Calculated) : 73  Temp (24hrs), Avg:98.1 F (36.7 C), Min:97.9 F (36.6 C), Max:98.4 F (36.9 C)  Recent Labs  Lab 07/07/23 0816 07/10/23 0646 07/10/23 0902 07/10/23 0903 07/12/23 0924 07/12/23 0926  WBC 9.0 7.3 7.6  --  7.1  --   CREATININE 4.86* 5.70*  --  5.62*  --  4.78*    Estimated Creatinine Clearance: 15.5 mL/min (A) (by C-G formula based on SCr of 4.78 mg/dL (H)).    Allergies  Allergen Reactions   Bee Venom Anaphylaxis   Atorvastatin Other (See Comments)    myalgia   Diltiazem Itching   Lopressor [Metoprolol]     NIGHTMARES. Pt tolerating this now 11/25/22   Oxycodone Other (See Comments)    Reaction not listed on MAR    Reglan [Metoclopramide] Other (See Comments)    Suicidal    Rosuvastatin Other (See Comments)    myalgias   Valsartan Itching   Zyrtec [Cetirizine] Other (See Comments)    intolerance    Antimicrobials this admission: Ceftriaxone 1/14 >> 1/17 Doxycycline 1/14 >> 1/17 Vancomycin 1/17 >>   Dose adjustments this admission: 1/20 PreHD vanc 18  Microbiology results: 1/14 BCx: 1/4 [place], 1/4 MRSA 1/16 MRSA PCR: detected 1/19 Bcx: ngtd  Thank you for involving pharmacy in the patient's care.   Lora Paula, PharmD PGY-2 Infectious Diseases Pharmacy Resident [facility] 07/12/2023 3:46  PM

## 2023-07-12 NOTE — TOC Progression Note (Signed)
Transition of Care [facility]) - Progression Note    Patient Details  Name: [name] MRN: [id number] Date of Birth: 01/06/1956  [facility]) CM/SW Contact  Kermit Balo, RN Phone Number: 07/12/2023, 1:17 PM  Clinical Narrative:     DME for home with hospice has not been delivered to the home. Family prefers d/c on Friday after HD.  DME to be delivered to the home per [facility] prior to Friday.  TOC following.  Expected Discharge Plan: Home w Hospice Care Barriers to Discharge: Continued Medical Work up  Expected Discharge Plan and Services   Discharge Planning Services: CM Consult Post Acute Care Choice: Hospice Living arrangements for the past 2 months: Single Family Home                                       Social Determinants of Health ([name]) Interventions [name] Screenings   Food Insecurity: No Food Insecurity (06/28/2023)  Housing: Low Risk  (06/28/2023)  Transportation Needs: No Transportation Needs (06/28/2023)  Utilities: Not At Risk (06/28/2023)  Alcohol Screen: Low Risk  (05/26/2023)  Depression (PHQ2-9): Low Risk  (05/26/2023)  Financial Resource Strain: Low Risk  (05/26/2023)  Physical Activity: Sufficiently Active (05/26/2023)  Social Connections: Unknown (06/28/2023)  Stress: No Stress Concern Present (05/26/2023)  Tobacco Use: Medium Risk (05/31/2023)  Health Literacy: Adequate Health Literacy (05/26/2023)    Readmission Risk Interventions    08/19/2022    8:11 AM 05/04/2022    9:06 AM 03/01/2022    8:14 AM  Readmission Risk Prevention Plan  Transportation Screening Complete Complete Complete  Medication Review (RN Care Manager) Complete Complete Complete  HRI or Home Care Consult Complete Complete Complete  SW Recovery Care/Counseling Consult Complete Complete Complete  Palliative Care Screening Not Applicable Not Applicable Not Applicable  Skilled Nursing Facility Not Applicable Not Applicable Not Applicable

## 2023-07-12 NOTE — Plan of Care (Signed)
  Problem: Education: Goal: Knowledge of disease or condition will improve Outcome: Progressing Goal: Knowledge of secondary prevention will improve (MUST DOCUMENT ALL) Outcome: Progressing Goal: Knowledge of patient specific risk factors will improve Loraine Leriche N/A or DELETE if not current risk factor) Outcome: Progressing   Problem: Ischemic Stroke/TIA Tissue Perfusion: Goal: Complications of ischemic stroke/TIA will be minimized Outcome: Progressing   Problem: Coping: Goal: Will verbalize positive feelings about self Outcome: Progressing Goal: Will identify appropriate support needs Outcome: Progressing   Problem: Health Behavior/Discharge Planning: Goal: Ability to manage health-related needs will improve Outcome: Progressing Goal: Goals will be collaboratively established with patient/family Outcome: Progressing   Problem: Self-Care: Goal: Ability to participate in self-care as condition permits will improve Outcome: Progressing Goal: Verbalization of feelings and concerns over difficulty with self-care will improve Outcome: Progressing Goal: Ability to communicate needs accurately will improve Outcome: Progressing   Problem: Nutrition: Goal: Risk of aspiration will decrease Outcome: Progressing Goal: Dietary intake will improve Outcome: Progressing   Problem: Education: Goal: Ability to describe self-care measures that may prevent or decrease complications (Diabetes Survival Skills Education) will improve Outcome: Progressing   Problem: Coping: Goal: Ability to adjust to condition or change in health will improve Outcome: Progressing   Problem: Fluid Volume: Goal: Ability to maintain a balanced intake and output will improve Outcome: Progressing   Problem: Health Behavior/Discharge Planning: Goal: Ability to identify and utilize available resources and services will improve Outcome: Progressing Goal: Ability to manage health-related needs will improve Outcome:  Progressing   Problem: Metabolic: Goal: Ability to maintain appropriate glucose levels will improve Outcome: Progressing   Problem: Nutritional: Goal: Maintenance of adequate nutrition will improve Outcome: Progressing Goal: Progress toward achieving an optimal weight will improve Outcome: Progressing   Problem: Skin Integrity: Goal: Risk for impaired skin integrity will decrease Outcome: Progressing   Problem: Tissue Perfusion: Goal: Adequacy of tissue perfusion will improve Outcome: Progressing   Problem: Education: Goal: Knowledge of General Education information will improve Description: Including pain rating scale, medication(s)/side effects and non-pharmacologic comfort measures Outcome: Progressing   Problem: Health Behavior/Discharge Planning: Goal: Ability to manage health-related needs will improve Outcome: Progressing   Problem: Clinical Measurements: Goal: Ability to maintain clinical measurements within normal limits will improve Outcome: Progressing Goal: Will remain free from infection Outcome: Progressing Goal: Diagnostic test results will improve Outcome: Progressing Goal: Respiratory complications will improve Outcome: Progressing Goal: Cardiovascular complication will be avoided Outcome: Progressing   Problem: Activity: Goal: Risk for activity intolerance will decrease Outcome: Progressing   Problem: Nutrition: Goal: Adequate nutrition will be maintained Outcome: Progressing   Problem: Coping: Goal: Level of anxiety will decrease Outcome: Progressing   Problem: Elimination: Goal: Will not experience complications related to bowel motility Outcome: Progressing Goal: Will not experience complications related to urinary retention Outcome: Progressing   Problem: Pain Managment: Goal: General experience of comfort will improve and/or be controlled Outcome: Progressing   Problem: Safety: Goal: Ability to remain free from injury will  improve Outcome: Progressing   Problem: Skin Integrity: Goal: Risk for impaired skin integrity will decrease Outcome: Progressing

## 2023-07-12 NOTE — Procedures (Signed)
I was present at this dialysis session. I have reviewed the session itself and made appropriate changes.   Filed Weights   07/10/23 0818 07/12/23 0917  Weight: 72.2 kg 72.9 kg    Recent Labs  Lab 07/12/23 0926  NA 135  K 3.9  CL 95*  CO2 25  GLUCOSE 152*  BUN 32*  CREATININE 4.78*  CALCIUM 8.8*  PHOS 3.2    Recent Labs  Lab 07/05/23 1119 07/07/23 0816 07/10/23 0646 07/10/23 0902 07/12/23 0924  WBC 7.6   < > 7.3 7.6 7.1  NEUTROABS 5.7  --  5.8  --  PENDING  HGB 12.5*   < > 11.3* 10.7* 11.9*  HCT 41.4   < > 36.2* 35.6* 38.7*  MCV 81.2   < > 81.0 81.5 82.2  PLT 304   < > 318 332 344   < > = values in this interval not displayed.    Scheduled Meds:  amiodarone  200 mg Oral BID   aspirin  81 mg Oral Daily   bisacodyl  10 mg Rectal Once   calcium acetate  667 mg Oral 3 times per day on Sunday Tuesday Thursday Saturday   And   calcium acetate  667 mg Oral 2 times per day on Monday Wednesday Friday   Chlorhexidine Gluconate Cloth  6 each Topical Q0600   Chlorhexidine Gluconate Cloth  6 each Topical Q0600   clopidogrel  75 mg Oral Daily   famotidine  20 mg Oral Daily   feeding supplement  237 mL Oral BID BM   heparin  5,000 Units Subcutaneous Q8H   levothyroxine  50 mcg Oral Daily   metoprolol tartrate  6.25 mg Oral BID   mupirocin ointment   Nasal BID   mouth rinse  15 mL Mouth Rinse 4 times per day   sertraline  100 mg Oral Daily   Continuous Infusions:  vancomycin 750 mg (07/10/23 1050)   PRN Meds:.acetaminophen **OR** acetaminophen (TYLENOL) oral liquid 160 mg/5 mL **OR** acetaminophen, fentaNYL (SUBLIMAZE) injection, mouth rinse, senna-docusate   Louie Bun,  MD 07/12/2023, 10:46 AM

## 2023-07-12 NOTE — Progress Notes (Signed)
[facility] Progress Note Patient: ADE STMARIE WUJ:[id number] DOB: July 17, 1955 DOA: 06/27/2023  DOS: the patient was seen and examined on 07/12/2023  Brief Hospital Course: PMH of CAD s/p CABG, combined systolic and diastolic CHF, ESRD MWF, type 2 diabetes, hypertension, SVT, OSA, upper GI bleed who presented with slurred speech and AMS. Pt moved into SNF about 9 months ago after a hospitalization. He was living alone prior to that. He fractured his right hip last April and had declined and is now mostly bedbound and wheelchair bound. Presented as a code stroke, CTA multiple stenosis. MRI confirmed multiple scattered acute strokes.  Neurology was consulted. Based on the conversation between palliative care and the family, goal is to arrange home with hospice without hemodialysis.  Continue hemodialysis in the hospital while hospice is arranged at home. [facility] will be accepting the patient, plan for DME arrangements.   Assessment and Plan: Acute ischemic bilateral hemispheres punctate infarct. CT head negative. CTA shows evidence of multiple stenosis. MRI brain confirmed multiple foci of scattered acute infarct in the bilateral cerebral hemisphere. Echocardiogram shows EF 45 to 50%.  LDL 24.  Hemoglobin A1c 8.1. Not on any antithrombotic therapy prior to admission Neurology recommendation was aspirin and Plavix for 3 weeks followed by aspirin alone Neuro signed off on 1/16  Dysphagia Speech therapy following. Secondary to stroke. On D2 diet thin liquid  ESRD on HD.  MWF. Nephrology following.  HD in the hospital until patient is discharged from the hospital on hospice.  Patient and family would like to stop HD after discharge.  MRSA bacteremia. Concern for CNS embolic process in the setting of presentation with acute stroke as well as bacteremia. TEE was requested but since the family is leaning towards more comfort approach, currently canceled. Continue  with IV vancomycin with dialysis. Appreciate ID consultation.  Acute encephalopathy, associated with a stroke as well as infection Multifactorial. Toxic in the setting of morphine use as well as metabolic in the setting of MRSA infection with bacteremia and missing HD. Improving  Anemia of chronic kidney disease. H&H stable. Receiving iron  Chronic systolic and diastolic CHF HTN. History of SVT. CAD SP CABG, cath 8/23 patent grafts. Echo in the past had EF of 35 to 40%. Echo this admission improved 45 to 50%. Clinically euvolemic.  Volume management with HD. Currently on Imdur and Toprol-XL. Also on amiodarone. On aspirin and Plavix. Blood pressure has been soft, holding Imdur, continue reduced Toprol-XL to 6.25 mg  OSA. On nighttime O2. Not on CPAP.  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 Currently on sliding scale insulin.  Hypothyroidism. On Synthroid. TSH 11.6 on admission.  Neuropathy Gabapentin, currently on hold.  Chronic pain syndrome Currently on fentanyl as needed only. Has not received narcotics since 1/20  Mood disorder. On Zoloft. Continue  GERD. On PPI twice daily.   Goals of care conversation. Current plan is to focus on comfort after discharge. Transition to home with hospice with [facility]. While hospice is being arranged at home and the patient is still in the hospital family would like to continue HD to allow patient to be at home in best possible condition for hospice initiation.  Subjective:  No new complaints. Reports generalized weakness  Physical Exam: General: NAD, deconditioned Cardiovascular: S1, S2 present Respiratory: CTAB Abdomen: Soft, nontender, nondistended, bowel sounds present Musculoskeletal: No bilateral pedal edema noted Skin: Normal Psychiatry: Fair mood  Disposition: Status is: Inpatient Remains inpatient  appropriate because: Awaiting hospice arrangement at home, d/c on 07/14/23  heparin injection 5,000 Units Start: 06/27/23 2200   Family Communication: No one at bedside Level of care: Telemetry Medical continue. Vitals:   07/12/23 1149 07/12/23 1205 07/12/23 1211 07/12/23 1242  BP: 132/67 123/65  122/63  Pulse: 69 70  73  Resp: 14 17  16   Temp:  98.2 F (36.8 C)  98 F (36.7 C)  TempSrc:    Oral  SpO2: 99% 97%  100%  Weight:   72.2 kg   Height:         Author: Briant Cedar, MD 07/12/2023 3:50 PM

## 2023-07-12 NOTE — Progress Notes (Signed)
Palliative:  HPI:  68 y.o. male  with past medical history of stroke, CAD s/p CABG, HFmrEF, ESRD on HD MWF, diabetes, HTN, SVT, OSA, upper GIB, esophageal cancer 2022, right eye vision loss, s/p hip fracture s/p repair admitted on 06/27/2023 with slurred speech and AMS due to stroke, uremia (missed HD), and new stroke.    I met today with [name] during HD. RN reports he is tolerating well, ~500 mL pulled, hanging antibiotics now. [name] is awake but appears sleepy. His speech is weak and difficult to hear and understand. He has more confusion today. I reiterate to him that we are working with [name] to get him back home. I shared that I was going to call [name] and coordinate and answer any questions she has. He tells me that he wants to talk to [name]. I told him that I will let her know and ask that she call him after HD.   I called and spoke with [name]. [name] shares that they have not heard from hospice about equipment and all the siblings are working to let their jobs know that they will need time off. Given lack of equipment and job responsibilities she requests d/c home Friday after HD. I expressed understanding knowing that they want to make this a smooth transition for [name]. They do not want home on Thursday as they want him to d/c as soon after HD as possible to give him every opportunity to be as alert and oriented as possible to enjoy being at home. His home is his childhood home where he actually cared for his mother with hospice. Family are looking forward to honoring his wish and providing him this same joy and comfort of being at home at the end of his life as well.   All questions/concerns addressed. Emotional support provided. I updated Dr. Sharolyn Douglas, Dr. Valentino Nose, and [facility].   Exam: Alert but confused. No distress. Generalized weakness and fatigue. Breathing regular, unlabored. Abd soft.   Plan: - DNR/DNI - No feeding tube - Home with hospice Friday after HD - May send home with low dose roxanol SL  (2.5-5 mg q3h PRN) for pain/shortness of breath  40 min  Yong Channel, NP Palliative Medicine Team Pager [phone] (Please see [email or web address] for schedule) Team Phone [phone]    Greater than 50%  of this time was spent counseling and coordinating care related to the above assessment and plan

## 2023-07-12 NOTE — Progress Notes (Signed)
ID Brief note  Patient has been transitioned to comfort focused measures, ID will SO. Please call with questions or concerns.  Odette Fraction, MD Infectious Disease Physician [facility] [address] [place], [place] [address] Phone: [phone]  Fax: [phone]

## 2023-07-12 NOTE — Progress Notes (Signed)
   07/12/23 1205  Vitals  Temp 98.2 F (36.8 C)  Pulse Rate 70  Resp 17  BP 123/65  SpO2 97 %  O2 Device Room Air  Post Treatment  Liters Processed 68.3  Fluid Removed (mL) 500 mL  Tolerated HD Treatment Yes  AVG/AVF Arterial Site Held (minutes) 6 minutes  AVG/AVF Venous Site Held (minutes) 7 minutes   Received patient in bed to unit.  Alert and oriented.  Informed consent signed and in chart.   TX duration:3.0 HOURS  Patient tolerated well.  Transported back to the room  Alert, without acute distress.  Hand-off given to patient's nurse.   Access used: LAVF Access issues: none  Total UF removed: Medication(s) given: See MAR    Laqueta Due, RN [facility]

## 2023-07-12 NOTE — Progress Notes (Signed)
[facility] Progress Note   Subjective:   Patient with no complaints today.  Plans to go home on hospice.  Now focused more on comfort  Objective Vitals:   07/12/23 0907 07/12/23 0917 07/12/23 0937 07/12/23 1007  BP: 108/66  (!) 107/55 (!) 107/56  Pulse: 71  72 77  Resp: 14  16 19   Temp:      TempSrc:      SpO2: 98%  99% 97%  Weight:  72.9 kg    Height:       Physical Exam General: Frail appearing male, NAD Heart: Normal rate, no rub Lungs: Bilateral chest rise with no increased work of breathing Abdomen: Soft, non-distended, +BS Extremities: No edema b/l lower extremities Dialysis Access:  LUE AVF + t/b   Additional Objective Labs: Basic Metabolic Panel: Recent Labs  Lab 07/10/23 0646 07/10/23 0903 07/12/23 0926  NA 133* 136 135  K 3.8 3.7 3.9  CL 93* 98 95*  CO2 27 26 25   GLUCOSE 154* 156* 152*  BUN 37* 38* 32*  CREATININE 5.70* 5.62* 4.78*  CALCIUM 8.8* 8.9 8.8*  PHOS 3.2 3.1 3.2   Liver Function Tests: Recent Labs  Lab 07/10/23 0646 07/10/23 0903 07/12/23 0926  AST 14*  --   --   ALT 8  --   --   ALKPHOS 76  --   --   BILITOT 0.9  --   --   PROT 6.0*  --   --   ALBUMIN 2.7* 2.6* 2.7*   No results for input(s): "LIPASE", "AMYLASE" in the last 168 hours. CBC: Recent Labs  Lab 07/05/23 1119 07/07/23 0816 07/10/23 0646 07/10/23 0902 07/12/23 0924  WBC 7.6 9.0 7.3 7.6 7.1  NEUTROABS 5.7  --  5.8  --  PENDING  HGB 12.5* 11.9* 11.3* 10.7* 11.9*  HCT 41.4 39.6 36.2* 35.6* 38.7*  MCV 81.2 80.8 81.0 81.5 82.2  PLT 304 342 318 332 344   Blood Culture    Component Value Date/Time   SDES BLOOD LEFT HAND 07/02/2023 1046   SPECREQUEST  07/02/2023 1046    BOTTLES DRAWN AEROBIC AND ANAEROBIC Blood Culture adequate volume   CULT  07/02/2023 1046    NO GROWTH 5 DAYS Performed at [facility], [address] [name], [place] [address]    REPTSTATUS 07/07/2023 FINAL 07/02/2023 1046    Cardiac Enzymes: No results for  input(s): "CKTOTAL", "CKMB", "CKMBINDEX", "TROPONINI" in the last 168 hours. CBG: Recent Labs  Lab 07/10/23 1311 07/10/23 1437 07/11/23 1219 07/11/23 1910 07/12/23 0616  GLUCAP 122* 136* 162* 156* 143*   Iron Studies: No results for input(s): "IRON", "TIBC", "TRANSFERRIN", "FERRITIN" in the last 72 hours. @lablastinr3 @ Studies/Results: No results found.  Medications:  vancomycin 750 mg (07/10/23 1050)    amiodarone  200 mg Oral BID   aspirin  81 mg Oral Daily   bisacodyl  10 mg Rectal Once   calcium acetate  667 mg Oral 3 times per day on Sunday Tuesday Thursday Saturday   And   calcium acetate  667 mg Oral 2 times per day on Monday Wednesday Friday   Chlorhexidine Gluconate Cloth  6 each Topical Q0600   Chlorhexidine Gluconate Cloth  6 each Topical Q0600   clopidogrel  75 mg Oral Daily   famotidine  20 mg Oral Daily   feeding supplement  237 mL Oral BID BM   heparin  5,000 Units Subcutaneous Q8H   levothyroxine  50 mcg Oral Daily  metoprolol tartrate  6.25 mg Oral BID   mupirocin ointment   Nasal BID   mouth rinse  15 mL Mouth Rinse 4 times per day   sertraline  100 mg Oral Daily    Dialysis Orders: [facility] MWF 4:00 400/600 EDW 75.3kg 2K/2.5Ca AVF Hep 5000 Last HD 1/10.  Mircera 200 q 2wks (last 06/16/23) Hectorol 1mg  TIW  Assessment/Plan: 1.Acute CVA - multiple scattered infarcts on MRI.  Per neurology  2.Acute encephalopathy with aphasia- likely multifactorial, seems to wax and wane 3.MRSA bacteremia on BCx 1/14. Repeat cultures neg to date.  Antibiotics per ID and primary team; no plan for prolonged treatment given plans for comfort care 4.ESRD -  HD MWF. Cont on schedule using AVF.  If patient's discharge is delayed until tomorrow may provide short session of dialysis in the morning 5.Hypertension/volume  -  BP ok.  Losing weight.  No concerns today 6.Anemia  - Hgb at goal.   Aranesp 100 mcg given on  1/20.  7.Metabolic bone disease -  Calcium and P acceptable.  No  need for binders at this point 8. H/o SVT - on amiodarone  9. T2DM - per primary  10. GOC - Palliative care consulted - Code status now DNR/DNI.  Plans to go to hospice with comfort measures

## 2023-07-12 NOTE — Plan of Care (Signed)
  Problem: Education: Goal: Knowledge of patient specific risk factors will improve Loraine Leriche N/A or DELETE if not current risk factor) Outcome: Progressing   Problem: Health Behavior/Discharge Planning: Goal: Goals will be collaboratively established with patient/family Outcome: Progressing   Problem: Nutrition: Goal: Risk of aspiration will decrease Outcome: Progressing   Problem: Clinical Measurements: Goal: Ability to maintain clinical measurements within normal limits will improve Outcome: Progressing Goal: Respiratory complications will improve Outcome: Progressing

## 2023-07-13 ENCOUNTER — Encounter (HOSPITAL_COMMUNITY): Payer: Self-pay | Admitting: Family Medicine

## 2023-07-13 DIAGNOSIS — Z7189 Other specified counseling: Secondary | ICD-10-CM | POA: Diagnosis not present

## 2023-07-13 DIAGNOSIS — Z515 Encounter for palliative care: Secondary | ICD-10-CM | POA: Diagnosis not present

## 2023-07-13 DIAGNOSIS — Z992 Dependence on renal dialysis: Secondary | ICD-10-CM | POA: Diagnosis not present

## 2023-07-13 DIAGNOSIS — N186 End stage renal disease: Secondary | ICD-10-CM | POA: Diagnosis not present

## 2023-07-13 DIAGNOSIS — I639 Cerebral infarction, unspecified: Secondary | ICD-10-CM | POA: Diagnosis not present

## 2023-07-13 LAB — RENAL FUNCTION PANEL
Albumin: 2.7 g/dL — ABNORMAL LOW (ref 3.5–5.0)
Anion gap: 13 (ref 5–15)
BUN: 22 mg/dL (ref 8–23)
CO2: 26 mmol/L (ref 22–32)
Calcium: 8.5 mg/dL — ABNORMAL LOW (ref 8.9–10.3)
Chloride: 96 mmol/L — ABNORMAL LOW (ref 98–111)
Creatinine, Ser: 3.67 mg/dL — ABNORMAL HIGH (ref 0.61–1.24)
GFR, Estimated: 17 mL/min — ABNORMAL LOW (ref >60)
Glucose, Bld: 137 mg/dL — ABNORMAL HIGH (ref 70–99)
Phosphorus: 2.9 mg/dL (ref 2.5–4.6)
Potassium: 3.8 mmol/L (ref 3.5–5.1)
Sodium: 135 mmol/L (ref 135–145)

## 2023-07-13 LAB — GLUCOSE, CAPILLARY
Glucose-Capillary: 150 mg/dL — ABNORMAL HIGH (ref 70–99)
Glucose-Capillary: 174 mg/dL — ABNORMAL HIGH (ref 70–99)
Glucose-Capillary: 187 mg/dL — ABNORMAL HIGH (ref 70–99)

## 2023-07-13 MED ORDER — HYDROCODONE-ACETAMINOPHEN 5-325 MG PO TABS
1.0000 | ORAL_TABLET | Freq: Four times a day (QID) | ORAL | Status: DC | PRN
  Administered 2023-07-13 (×2): 1 via ORAL
  Filled 2023-07-13 (×2): qty 1

## 2023-07-13 MED ORDER — HYDROCODONE-ACETAMINOPHEN 5-325 MG PO TABS: 1.0000 | ORAL_TABLET | Freq: Four times a day (QID) | ORAL | Status: DC | PRN

## 2023-07-13 NOTE — Progress Notes (Signed)
Palliative:  HPI:  68 y.o. male  with past medical history of stroke, CAD s/p CABG, HFmrEF, ESRD on HD MWF, diabetes, HTN, SVT, OSA, upper GIB, esophageal cancer 2022, right eye vision loss, s/p hip fracture s/p repair admitted on 06/27/2023 with slurred speech and AMS due to stroke, uremia (missed HD), and new stroke.    I met today with [name]. He is awake. Able to answer my questions and converse with some delay. Not confused today. He is looking forward to going home. I ask about his pain and he reports that pain medication is controlling his pain well. He has no further specific questions or complains. He is relaxing and watching television.   I discussed with TOC. Plans ongoing for discharge home with hospice after dialysis tomorrow. Awaiting confirmation that equipment has been delivered. I called to touch base with Amy but no answer. Voicemail left to let her know to call me if any needs and that I will not be here tomorrow but the same contact number will reach my colleagues if needed.   Exam: Alert, more oriented today. Delayed but appropriate responses. No distress. Generalized weakness and fatigue. Breathing regular, unlabored. Abd soft.   Plan: - DNR/DNI.  - No feeding tube.  - Home with hospice Friday after HD.  - Recommend oxycodone concentrated solution 5-10 mg q3h PRN pain/shortness of breath.   25 min  Yong Channel, NP Palliative Medicine Team Pager [phone] (Please see [email or web address] for schedule) Team Phone [phone]

## 2023-07-13 NOTE — Progress Notes (Signed)
[facility] Progress Note Patient: [name] [id number]:[id number] DOB: November 11, 1955 DOA: 06/27/2023  DOS: the patient was seen and examined on 07/13/2023  Brief Hospital Course: PMH of CAD s/p CABG, combined systolic and diastolic CHF, ESRD MWF, type 2 diabetes, hypertension, SVT, OSA, upper GI bleed who presented with slurred speech and AMS. Pt moved into SNF about 9 months ago after a hospitalization. He was living alone prior to that. He fractured his right hip last April and had declined and is now mostly bedbound and wheelchair bound. Presented as a code stroke, CTA multiple stenosis. MRI confirmed multiple scattered acute strokes.  Neurology was consulted. Based on the conversation between palliative care and the family, goal is to arrange home with hospice without hemodialysis.  Continue hemodialysis in the hospital while hospice is arranged at home. [facility] will be accepting the patient, plan for DME arrangements.   Assessment and Plan: Acute ischemic bilateral hemispheres punctate infarct. CT head negative. CTA shows evidence of multiple stenosis. MRI brain confirmed multiple foci of scattered acute infarct in the bilateral cerebral hemisphere. Echocardiogram shows EF 45 to 50%.  LDL 24.  Hemoglobin A1c 8.1. Not on any antithrombotic therapy prior to admission Neurology recommendation was aspirin and Plavix for 3 weeks followed by aspirin alone Neuro signed off on 1/16  Dysphagia Speech therapy following. Secondary to stroke. On D2 diet thin liquid  ESRD on HD.  MWF. Nephrology following.  HD in the hospital until patient is discharged from the hospital on hospice.  Patient and family would like to stop HD after discharge.  MRSA bacteremia. Concern for CNS embolic process in the setting of presentation with acute stroke as well as bacteremia. TEE was requested but since the family is leaning towards more comfort approach, currently canceled. Continue  with IV vancomycin with dialysis. Appreciate ID consultation.  Acute encephalopathy, associated with a stroke as well as infection Multifactorial. Toxic in the setting of morphine use as well as metabolic in the setting of MRSA infection with bacteremia and missing HD. Improving  Anemia of chronic kidney disease. H&H stable. Receiving iron  Chronic systolic and diastolic CHF HTN. History of SVT. CAD SP CABG, cath 8/23 patent grafts. Echo in the past had EF of 35 to 40%. Echo this admission improved 45 to 50%. Volume management with HD. Currently on Imdur and Toprol-XL. Also on amiodarone. On aspirin and Plavix. Blood pressure has been soft, holding Imdur, continue reduced Toprol-XL to 6.25 mg  OSA. On nighttime O2. Not on CPAP.  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 Currently on sliding scale insulin.  Hypothyroidism. On Synthroid. TSH 11.6 on admission.  Neuropathy Gabapentin, currently on hold.  Chronic pain syndrome Continue Norco  Mood disorder. On Zoloft. Continue  GERD. On PPI twice daily.   Goals of care conversation. Current plan is to focus on comfort after discharge. Transition to home with hospice with [facility]. While hospice is being arranged at home and the patient is still in the hospital family would like to continue HD to allow patient to be at home in best possible condition for hospice initiation.  Subjective:  No new complaints  Physical Exam: General: NAD, deconditioned Cardiovascular: S1, S2 present Respiratory: CTAB Abdomen: Soft, nontender, nondistended, bowel sounds present Musculoskeletal: No bilateral pedal edema noted Skin: Normal Psychiatry: Fair mood     Disposition: Status is: Inpatient Remains inpatient appropriate because: Awaiting hospice arrangement at home, d/c  on 07/14/23  heparin injection 5,000 Units Start:  06/27/23 2200   Family Communication: No one at bedside Level of care: Telemetry Medical continue. Vitals:   07/13/23 0520 07/13/23 0729 07/13/23 1131 07/13/23 1629  BP: (!) 149/61 (!) 147/65 130/64 119/61  Pulse: 71 68 69 65  Resp: 18 18 18 17   Temp: 98.1 F (36.7 C) 97.6 F (36.4 C) 98.2 F (36.8 C) (!) 97.5 F (36.4 C)  TempSrc: Oral Oral Oral Oral  SpO2: 95% 97% 98% 98%  Weight:      Height:         Author: Briant Cedar, MD 07/13/2023 4:30 PM

## 2023-07-13 NOTE — Plan of Care (Signed)
  Problem: Self-Care: Goal: Ability to communicate needs accurately will improve Outcome: Progressing   Problem: Nutritional: Goal: Maintenance of adequate nutrition will improve Outcome: Progressing   Problem: Clinical Measurements: Goal: Will remain free from infection Outcome: Progressing

## 2023-07-13 NOTE — Progress Notes (Signed)
[facility] Progress Note   Subjective:   Tolerated dialysis yesterday.  No complaints today.  Objective Vitals:   07/12/23 2047 07/13/23 0038 07/13/23 0520 07/13/23 0729  BP: (!) 120/53 (!) 142/68 (!) 149/61 (!) 147/65  Pulse: 71 70 71 68  Resp: 18 16 18 18   Temp: 98.5 F (36.9 C) 98.4 F (36.9 C) 98.1 F (36.7 C) 97.6 F (36.4 C)  TempSrc: Oral Oral Oral Oral  SpO2: 99% 100% 95% 97%  Weight:      Height:       Physical Exam General: Frail appearing male, NAD Heart: Normal rate, no rub Lungs: Bilateral chest rise with no increased work of breathing Abdomen: Soft, non-distended, +BS Extremities: No edema b/l lower extremities Dialysis Access:  LUE AVF + t/b   Additional Objective Labs: Basic Metabolic Panel: Recent Labs  Lab 07/10/23 0903 07/12/23 0926 07/13/23 0623  NA 136 135 135  K 3.7 3.9 3.8  CL 98 95* 96*  CO2 26 25 26   GLUCOSE 156* 152* 137*  BUN 38* 32* 22  CREATININE 5.62* 4.78* 3.67*  CALCIUM 8.9 8.8* 8.5*  PHOS 3.1 3.2 2.9   Liver Function Tests: Recent Labs  Lab 07/10/23 0646 07/10/23 0903 07/12/23 0926 07/13/23 0623  AST 14*  --   --   --   ALT 8  --   --   --   ALKPHOS 76  --   --   --   BILITOT 0.9  --   --   --   PROT 6.0*  --   --   --   ALBUMIN 2.7* 2.6* 2.7* 2.7*   No results for input(s): "LIPASE", "AMYLASE" in the last 168 hours. CBC: Recent Labs  Lab 07/07/23 0816 07/10/23 0646 07/10/23 0902 07/12/23 0924  WBC 9.0 7.3 7.6 7.1  NEUTROABS  --  5.8  --  6.0  HGB 11.9* 11.3* 10.7* 11.9*  HCT 39.6 36.2* 35.6* 38.7*  MCV 80.8 81.0 81.5 82.2  PLT 342 318 332 344   Blood Culture    Component Value Date/Time   SDES BLOOD LEFT HAND 07/02/2023 1046   SPECREQUEST  07/02/2023 1046    BOTTLES DRAWN AEROBIC AND ANAEROBIC Blood Culture adequate volume   CULT  07/02/2023 1046    NO GROWTH 5 DAYS Performed at [facility], [address] [place], [place] [address]    REPTSTATUS 07/07/2023 FINAL  07/02/2023 1046    Cardiac Enzymes: No results for input(s): "CKTOTAL", "CKMB", "CKMBINDEX", "TROPONINI" in the last 168 hours. CBG: Recent Labs  Lab 07/11/23 1910 07/12/23 0616 07/12/23 1325 07/12/23 1718 07/13/23 0621  GLUCAP 156* 143* 131* 160* 150*   Iron Studies: No results for input(s): "IRON", "TIBC", "TRANSFERRIN", "FERRITIN" in the last 72 hours. @lablastinr3 @ Studies/Results: No results found.  Medications:  vancomycin Stopped (07/12/23 1149)    amiodarone  200 mg Oral BID   aspirin  81 mg Oral Daily   bisacodyl  10 mg Rectal Once   Chlorhexidine Gluconate Cloth  6 each Topical Q0600   Chlorhexidine Gluconate Cloth  6 each Topical Q0600   clopidogrel  75 mg Oral Daily   famotidine  20 mg Oral Daily   feeding supplement  237 mL Oral BID BM   heparin  5,000 Units Subcutaneous Q8H   levothyroxine  50 mcg Oral Daily   metoprolol tartrate  6.25 mg Oral BID   mupirocin ointment   Nasal BID   mouth rinse  15 mL Mouth Rinse  4 times per day   sertraline  100 mg Oral Daily    Dialysis Orders: [facility] MWF 4:00 400/600 EDW 75.3kg 2K/2.5Ca AVF Hep 5000 Last HD 1/10.  Mircera 200 q 2wks (last 06/16/23) Hectorol 1mg  TIW  Assessment/Plan: 1.Acute CVA - multiple scattered infarcts on MRI.  Per neurology.  Now planning for hospice 2.Acute encephalopathy with aphasia- likely multifactorial, seems to wax and wane 3.MRSA bacteremia on BCx 1/14. Repeat cultures neg to date.  Antibiotics per ID and primary team; no plan for prolonged treatment given plans for comfort care 4.ESRD -  HD MWF. Cont on schedule using AVF.  Plan for dialysis in the morning and then discharge home on hospice 5.Hypertension/volume  -  BP ok.  Losing weight.  No concerns today 6.Anemia  - Hgb at goal.   Aranesp 100 mcg given on  1/20.  7.Metabolic bone disease -  Calcium and P acceptable.  No need for binders at this point 8. H/o SVT - on amiodarone  9. T2DM - per primary  10. GOC - Palliative care  consulted - Code status now DNR/DNI.  Plans to go to hospice with comfort measures

## 2023-07-13 NOTE — Plan of Care (Signed)
  Problem: Education: Goal: Knowledge of disease or condition will improve Outcome: Progressing Goal: Knowledge of secondary prevention will improve (MUST DOCUMENT ALL) Outcome: Progressing Goal: Knowledge of patient specific risk factors will improve Loraine Leriche N/A or DELETE if not current risk factor) Outcome: Progressing   Problem: Ischemic Stroke/TIA Tissue Perfusion: Goal: Complications of ischemic stroke/TIA will be minimized Outcome: Progressing   Problem: Coping: Goal: Will verbalize positive feelings about self Outcome: Progressing Goal: Will identify appropriate support needs Outcome: Progressing   Problem: Health Behavior/Discharge Planning: Goal: Ability to manage health-related needs will improve Outcome: Progressing Goal: Goals will be collaboratively established with patient/family Outcome: Progressing   Problem: Self-Care: Goal: Ability to participate in self-care as condition permits will improve Outcome: Progressing Goal: Verbalization of feelings and concerns over difficulty with self-care will improve Outcome: Progressing Goal: Ability to communicate needs accurately will improve Outcome: Progressing   Problem: Nutrition: Goal: Risk of aspiration will decrease Outcome: Progressing Goal: Dietary intake will improve Outcome: Progressing   Problem: Education: Goal: Ability to describe self-care measures that may prevent or decrease complications (Diabetes Survival Skills Education) will improve Outcome: Progressing   Problem: Coping: Goal: Ability to adjust to condition or change in health will improve Outcome: Progressing   Problem: Fluid Volume: Goal: Ability to maintain a balanced intake and output will improve Outcome: Progressing   Problem: Health Behavior/Discharge Planning: Goal: Ability to identify and utilize available resources and services will improve Outcome: Progressing Goal: Ability to manage health-related needs will improve Outcome:  Progressing   Problem: Metabolic: Goal: Ability to maintain appropriate glucose levels will improve Outcome: Progressing   Problem: Nutritional: Goal: Maintenance of adequate nutrition will improve Outcome: Progressing Goal: Progress toward achieving an optimal weight will improve Outcome: Progressing   Problem: Skin Integrity: Goal: Risk for impaired skin integrity will decrease Outcome: Progressing   Problem: Tissue Perfusion: Goal: Adequacy of tissue perfusion will improve Outcome: Progressing   Problem: Education: Goal: Knowledge of General Education information will improve Description: Including pain rating scale, medication(s)/side effects and non-pharmacologic comfort measures Outcome: Progressing   Problem: Health Behavior/Discharge Planning: Goal: Ability to manage health-related needs will improve Outcome: Progressing   Problem: Clinical Measurements: Goal: Ability to maintain clinical measurements within normal limits will improve Outcome: Progressing Goal: Will remain free from infection Outcome: Progressing Goal: Diagnostic test results will improve Outcome: Progressing Goal: Cardiovascular complication will be avoided Outcome: Progressing   Problem: Activity: Goal: Risk for activity intolerance will decrease Outcome: Progressing   Problem: Nutrition: Goal: Adequate nutrition will be maintained Outcome: Progressing   Problem: Coping: Goal: Level of anxiety will decrease Outcome: Progressing   Problem: Elimination: Goal: Will not experience complications related to bowel motility Outcome: Progressing Goal: Will not experience complications related to urinary retention Outcome: Progressing   Problem: Pain Managment: Goal: General experience of comfort will improve and/or be controlled Outcome: Progressing   Problem: Safety: Goal: Ability to remain free from injury will improve Outcome: Progressing   Problem: Skin Integrity: Goal: Risk for  impaired skin integrity will decrease Outcome: Progressing

## 2023-07-13 NOTE — TOC Progression Note (Signed)
Transition of Care [facility]) - Progression Note    Patient Details  Name: [name] MRN: [id number] Date of Birth: [birth year]  Transition of Care [facility]) CM/SW Contact  Kermit Balo, RN Phone Number: 07/13/2023, 4:05 PM  Clinical Narrative:     Sister says the DME is being delivered today. Plan is for home tomorrow after HD. TOC following.  Expected Discharge Plan: Home w Hospice Care Barriers to Discharge: Continued Medical Work up  Expected Discharge Plan and Services   Discharge Planning Services: CM Consult Post Acute Care Choice: Hospice Living arrangements for the past 2 months: Single Family Home                                       Social Determinants of Health ([name]) Interventions [name] Screenings   Food Insecurity: No Food Insecurity (06/28/2023)  Housing: Low Risk  (06/28/2023)  Transportation Needs: No Transportation Needs (06/28/2023)  Utilities: Not At Risk (06/28/2023)  Alcohol Screen: Low Risk  (05/26/2023)  Depression (PHQ2-9): Low Risk  (05/26/2023)  Financial Resource Strain: Low Risk  (05/26/2023)  Physical Activity: Sufficiently Active (05/26/2023)  Social Connections: Unknown (06/28/2023)  Stress: No Stress Concern Present (05/26/2023)  Tobacco Use: Medium Risk (05/31/2023)  Health Literacy: Adequate Health Literacy (05/26/2023)    Readmission Risk Interventions    08/19/2022    8:11 AM 05/04/2022    9:06 AM 03/01/2022    8:14 AM  Readmission Risk Prevention Plan  Transportation Screening Complete Complete Complete  Medication Review (RN Care Manager) Complete Complete Complete  HRI or Home Care Consult Complete Complete Complete  SW Recovery Care/Counseling Consult Complete Complete Complete  Palliative Care Screening Not Applicable Not Applicable Not Applicable  Skilled Nursing Facility Not Applicable Not Applicable Not Applicable

## 2023-07-14 DIAGNOSIS — I639 Cerebral infarction, unspecified: Secondary | ICD-10-CM | POA: Diagnosis not present

## 2023-07-14 LAB — RENAL FUNCTION PANEL
Albumin: 2.7 g/dL — ABNORMAL LOW (ref 3.5–5.0)
Anion gap: 13 (ref 5–15)
BUN: 31 mg/dL — ABNORMAL HIGH (ref 8–23)
CO2: 28 mmol/L (ref 22–32)
Calcium: 8.6 mg/dL — ABNORMAL LOW (ref 8.9–10.3)
Chloride: 93 mmol/L — ABNORMAL LOW (ref 98–111)
Creatinine, Ser: 4.41 mg/dL — ABNORMAL HIGH (ref 0.61–1.24)
GFR, Estimated: 14 mL/min — ABNORMAL LOW (ref >60)
Glucose, Bld: 142 mg/dL — ABNORMAL HIGH (ref 70–99)
Phosphorus: 3.8 mg/dL (ref 2.5–4.6)
Potassium: 4 mmol/L (ref 3.5–5.1)
Sodium: 134 mmol/L — ABNORMAL LOW (ref 135–145)

## 2023-07-14 LAB — CBC
HCT: 40 % (ref 39.0–52.0)
Hemoglobin: 12.4 g/dL — ABNORMAL LOW (ref 13.0–17.0)
MCH: 25.1 pg — ABNORMAL LOW (ref 26.0–34.0)
MCHC: 31 g/dL (ref 30.0–36.0)
MCV: 80.8 fL (ref 80.0–100.0)
Platelets: 301 10*3/uL (ref 150–400)
RBC: 4.95 MIL/uL (ref 4.22–5.81)
RDW: 21.2 % — ABNORMAL HIGH (ref 11.5–15.5)
WBC: 5.1 10*3/uL (ref 4.0–10.5)
nRBC: 0 % (ref 0.0–0.2)

## 2023-07-14 LAB — GLUCOSE, CAPILLARY
Glucose-Capillary: 134 mg/dL — ABNORMAL HIGH (ref 70–99)
Glucose-Capillary: 150 mg/dL — ABNORMAL HIGH (ref 70–99)

## 2023-07-14 MED ORDER — CLOPIDOGREL BISULFATE 75 MG PO TABS: 75.0000 mg | ORAL_TABLET | Freq: Every day | ORAL | 0 refills | Status: DC

## 2023-07-14 MED ORDER — FAMOTIDINE 20 MG PO TABS: 20.0000 mg | ORAL_TABLET | Freq: Every day | ORAL | 0 refills | Status: DC

## 2023-07-14 MED ORDER — METOPROLOL TARTRATE 25 MG/10 ML ORAL SUSPENSION: 6.2500 mg | Freq: Two times a day (BID) | ORAL | Status: DC

## 2023-07-14 MED ORDER — ASPIRIN 81 MG PO TBEC: 81.0000 mg | DELAYED_RELEASE_TABLET | Freq: Every day | ORAL | 0 refills | Status: DC

## 2023-07-14 MED ORDER — FENTANYL 25 MCG/HR TD PT72: 1.0000 | MEDICATED_PATCH | TRANSDERMAL | 0 refills | Status: DC

## 2023-07-14 NOTE — Progress Notes (Signed)
Patient off the unit for HD

## 2023-07-14 NOTE — Progress Notes (Signed)
Contacted [facility] to be advised that pt will d/c to home with hospice today. Clinic advised pt had last HD treatment today prior to d/c.   Olivia Canter Renal Navigator [phone]

## 2023-07-14 NOTE — Procedures (Signed)
Received patient in bed to unit.  Alert and oriented.  Informed consent signed and in chart.   TX duration: 3 hours  Patient tolerated well.  Transported back to the room  Alert, without acute distress.  Hand-off given to patient's nurse.   Access used: lavf  Access issues: none  Total UF removed: 1 liter Medication(s) given: vancomycin   Lu Duffel, RN Kidney Dialysis Unit

## 2023-07-14 NOTE — Progress Notes (Addendum)
[facility] Progress Note   Subjective: Patient resting in bed with no complaints.  Seen on dialysis  Objective Vitals:   07/14/23 0830 07/14/23 0907 07/14/23 0914 07/14/23 0930  BP: (!) 155/68 135/65  125/71  Pulse: 66 64 62 68  Resp: 15 16 10 16   Temp: 97.8 F (36.6 C)     TempSrc: Axillary     SpO2: 92% 97% 97% 97%  Weight:      Height:       Physical Exam General: Frail appearing male, NAD Heart: Normal rate, no rub Lungs: Bilateral chest rise with no increased work of breathing Abdomen: Soft, non-distended, +BS Extremities: No edema b/l lower extremities Dialysis Access:  LUE AVF + t/b   Additional Objective Labs: Basic Metabolic Panel: Recent Labs  Lab 07/12/23 0926 07/13/23 0623 07/14/23 0643  NA 135 135 134*  K 3.9 3.8 4.0  CL 95* 96* 93*  CO2 25 26 28   GLUCOSE 152* 137* 142*  BUN 32* 22 31*  CREATININE 4.78* 3.67* 4.41*  CALCIUM 8.8* 8.5* 8.6*  PHOS 3.2 2.9 3.8   Liver Function Tests: Recent Labs  Lab 07/10/23 0646 07/10/23 0903 07/12/23 0926 07/13/23 0623 07/14/23 0643  AST 14*  --   --   --   --   ALT 8  --   --   --   --   ALKPHOS 76  --   --   --   --   BILITOT 0.9  --   --   --   --   PROT 6.0*  --   --   --   --   ALBUMIN 2.7*   < > 2.7* 2.7* 2.7*   < > = values in this interval not displayed.   No results for input(s): "LIPASE", "AMYLASE" in the last 168 hours. CBC: Recent Labs  Lab 07/10/23 0646 07/10/23 0902 07/12/23 0924 07/14/23 0642  WBC 7.3 7.6 7.1 5.1  NEUTROABS 5.8  --  6.0  --   HGB 11.3* 10.7* 11.9* 12.4*  HCT 36.2* 35.6* 38.7* 40.0  MCV 81.0 81.5 82.2 80.8  PLT 318 332 344 301   Blood Culture    Component Value Date/Time   SDES BLOOD LEFT HAND 07/02/2023 1046   SPECREQUEST  07/02/2023 1046    BOTTLES DRAWN AEROBIC AND ANAEROBIC Blood Culture adequate volume   CULT  07/02/2023 1046    NO GROWTH 5 DAYS Performed at [facility], [address], [place], [place] [address]    REPTSTATUS  07/07/2023 FINAL 07/02/2023 1046    Cardiac Enzymes: No results for input(s): "CKTOTAL", "CKMB", "CKMBINDEX", "TROPONINI" in the last 168 hours. CBG: Recent Labs  Lab 07/13/23 0621 07/13/23 1132 07/13/23 1722 07/14/23 0002 07/14/23 0644  GLUCAP 150* 174* 187* 150* 134*   Iron Studies: No results for input(s): "IRON", "TIBC", "TRANSFERRIN", "FERRITIN" in the last 72 hours. @lablastinr3 @ Studies/Results: No results found.  Medications:  vancomycin Stopped (07/12/23 1149)    amiodarone  200 mg Oral BID   aspirin  81 mg Oral Daily   bisacodyl  10 mg Rectal Once   Chlorhexidine Gluconate Cloth  6 each Topical Q0600   Chlorhexidine Gluconate Cloth  6 each Topical Q0600   clopidogrel  75 mg Oral Daily   famotidine  20 mg Oral Daily   feeding supplement  237 mL Oral BID BM   heparin  5,000 Units Subcutaneous Q8H   levothyroxine  50 mcg Oral Daily   metoprolol tartrate  6.25 mg Oral BID   mupirocin ointment   Nasal BID   mouth rinse  15 mL Mouth Rinse 4 times per day   sertraline  100 mg Oral Daily    Dialysis Orders: [facility] MWF 4:00 400/600 EDW 75.3kg 2K/2.5Ca AVF Hep 5000 Last HD 1/10.  Mircera 200 q 2wks (last 06/16/23) Hectorol 1mg  TIW  Assessment/Plan: 1.Acute CVA - multiple scattered infarcts on MRI.  Per neurology.  Now planning for hospice 2.Acute encephalopathy with aphasia- likely multifactorial, seems to wax and wane 3.MRSA bacteremia on BCx 1/14. Repeat cultures neg to date.  Antibiotics per ID and primary team; no plan for prolonged treatment given plans for comfort care 4.ESRD -  HD MWF. Cont on schedule using AVF.  Dialysis today and likely discharge 5.Hypertension/volume  -  BP ok.  Losing weight.  No concerns today 6.Anemia  - Hgb at goal.   Aranesp 100 mcg given on  1/20.  7.Metabolic bone disease -  Calcium and P acceptable.  No need for binders at this point 8. H/o SVT - on amiodarone  9. T2DM - per primary  10. GOC - Palliative care consulted - Code  status now DNR/DNI.  Plans to go to hospice with comfort measures

## 2023-07-14 NOTE — Progress Notes (Signed)
Patient back to the unit.

## 2023-07-14 NOTE — Procedures (Signed)
I was present at this dialysis session. I have reviewed the session itself and made appropriate changes.   Filed Weights   07/10/23 0818 07/12/23 0917 07/12/23 1211  Weight: 72.2 kg 72.9 kg 72.2 kg    Recent Labs  Lab 07/14/23 0643  NA 134*  K 4.0  CL 93*  CO2 28  GLUCOSE 142*  BUN 31*  CREATININE 4.41*  CALCIUM 8.6*  PHOS 3.8    Recent Labs  Lab 07/10/23 0646 07/10/23 0902 07/12/23 0924 07/14/23 0642  WBC 7.3 7.6 7.1 5.1  NEUTROABS 5.8  --  6.0  --   HGB 11.3* 10.7* 11.9* 12.4*  HCT 36.2* 35.6* 38.7* 40.0  MCV 81.0 81.5 82.2 80.8  PLT 318 332 344 301    Scheduled Meds:  amiodarone  200 mg Oral BID   aspirin  81 mg Oral Daily   bisacodyl  10 mg Rectal Once   Chlorhexidine Gluconate Cloth  6 each Topical Q0600   Chlorhexidine Gluconate Cloth  6 each Topical Q0600   clopidogrel  75 mg Oral Daily   famotidine  20 mg Oral Daily   feeding supplement  237 mL Oral BID BM   heparin  5,000 Units Subcutaneous Q8H   levothyroxine  50 mcg Oral Daily   metoprolol tartrate  6.25 mg Oral BID   mupirocin ointment   Nasal BID   mouth rinse  15 mL Mouth Rinse 4 times per day   sertraline  100 mg Oral Daily   Continuous Infusions:  vancomycin Stopped (07/12/23 1149)   PRN Meds:.acetaminophen **OR** acetaminophen (TYLENOL) oral liquid 160 mg/5 mL **OR** acetaminophen, HYDROcodone-acetaminophen, mouth rinse, senna-docusate   Louie Bun,  MD 07/14/2023, 10:02 AM

## 2023-07-14 NOTE — TOC Transition Note (Signed)
Transition of Care [facility]) - Discharge Note   Patient Details  Name: [name] MRN: [id number] Date of Birth: [birth year]  Transition of Care [facility]) CM/SW Contact:  Kermit Balo, RN Phone Number: 07/14/2023, 3:01 PM   Clinical Narrative:     Pt is discharging home with hospice services through [facility]. DME has been delivered to the home. Pt has 24 hour support at home.  CM has left voicemail for [facility] about d/c and timing.  [facility] will transport the patient home.  CM has called and updated the patients sister.   Final next level of care: Home w Hospice Care Barriers to Discharge: No Barriers Identified   Patient Goals and CMS Choice Patient states their goals for this hospitalization and ongoing recovery are:: Patient unable to partcipate in goal setting, not fully oriented. CMS Medicare.gov Compare Post Acute Care list provided to:: Patient Represenative (must comment) Choice offered to / list presented to : Sibling      Discharge Placement                       Discharge Plan and Services Additional resources added to the After Visit Summary for     Discharge Planning Services: CM Consult Post Acute Care Choice: Hospice                               Social Drivers of Health ([name]) Interventions [name] Screenings   Food Insecurity: No Food Insecurity (06/28/2023)  Housing: Low Risk  (06/28/2023)  Transportation Needs: No Transportation Needs (06/28/2023)  Utilities: Not At Risk (06/28/2023)  Alcohol Screen: Low Risk  (05/26/2023)  Depression (PHQ2-9): Low Risk  (05/26/2023)  Financial Resource Strain: Low Risk  (05/26/2023)  Physical Activity: Sufficiently Active (05/26/2023)  Social Connections: Unknown (06/28/2023)  Stress: No Stress Concern Present (05/26/2023)  Tobacco Use: Medium Risk (07/13/2023)  Health Literacy: Adequate Health Literacy (05/26/2023)     Readmission Risk Interventions    08/19/2022    8:11 AM 05/04/2022    9:06 AM 03/01/2022     8:14 AM  Readmission Risk Prevention Plan  Transportation Screening Complete Complete Complete  Medication Review (RN Care Manager) Complete Complete Complete  HRI or Home Care Consult Complete Complete Complete  SW Recovery Care/Counseling Consult Complete Complete Complete  Palliative Care Screening Not Applicable Not Applicable Not Applicable  Skilled Nursing Facility Not Applicable Not Applicable Not Applicable

## 2023-07-14 NOTE — Progress Notes (Signed)
SLP Cancellation Note  Patient Details Name: [name] MRN: [id number] DOB: 1955/10/27   Cancelled treatment:  Pt D/CIng home with hospice today.  Our service will respectfully sign off. He should be able to eat/drink whatever brings him pleasure.  [name] L. [name], MA CCC/SLP Clinical Specialist - Acute Care SLP Acute Rehabilitation Services Office number [phone]          [name] 07/14/2023, 2:55 PM

## 2023-07-14 NOTE — Discharge Summary (Addendum)
Physician Discharge Summary   Patient: [name] MRN: [id number] DOB: [birth year]  Admit date:     06/27/2023  Discharge date: 07/14/23  Discharge Physician: Briant Cedar   PCP: Babs Sciara, MD   Recommendations at discharge:    Follow up with Home Hospice  Discharge Diagnoses: Principal Problem:   Stroke [facility]) Active Problems:   Chronic combined systolic and diastolic CHF (congestive heart failure) (HCC)   HTN (hypertension)   Pleural effusion   Type 2 diabetes mellitus (HCC)   Obstructive sleep apnea   S/P CABG (coronary artery bypass graft)   ESRD (end stage renal disease) on dialysis (HCC)   Abnormal TSH   History of supraventricular tachycardia   Acute ischemic stroke (HCC)   Bacteremia   Pressure injury of skin    Hospital Course: PMH of CAD s/p CABG, combined systolic and diastolic CHF, ESRD MWF, type 2 diabetes, hypertension, SVT, OSA, upper GI bleed who presented with slurred speech and AMS. Pt moved into SNF about 9 months ago after a hospitalization. He was living alone prior to that. He fractured his right hip last April and had declined and is now mostly bedbound and wheelchair bound. Presented as a code stroke, CTA multiple stenosis. MRI confirmed multiple scattered acute strokes. Neurology was consulted. Based on the conversation between palliative care and the family, goal is to arrange home with hospice without hemodialysis. Continue hemodialysis in the hospital while hospice is arranged at home. [facility] will be following the patient, DME equipments planned to to delivered on 07/13/2023.  Home after HD today   Today, patient denied any new complaints.  Home hospice to review all medications to determine which medications may be appropriate for patient as the plan will be to switch patient to comfort measures.     Assessment and Plan:  Acute ischemic bilateral hemispheres punctate infarct. CT head negative. CTA shows  evidence of multiple stenosis. MRI brain confirmed multiple foci of scattered acute infarct in the bilateral cerebral hemisphere Echocardiogram shows EF 45 to 50%.  LDL 24.  Hemoglobin A1c 8.1. Not on any antithrombotic therapy prior to admission Neurology recommendation was aspirin and Plavix for 3 weeks followed by aspirin alone, last dose of Plavix will be on 07/18/2023, if continuing by hospice Neuro signed off on 1/16   Dysphagia Speech therapy following Secondary to stroke On dysphagia 2 diet thin liquid   ESRD on HD.  MWF Nephrology following HD in the hospital until patient is discharged from the hospital on hospice.  Patient and family would like to stop HD after discharge   MRSA bacteremia Concern for CNS embolic process in the setting of presentation with acute stroke as well as bacteremia. TEE was requested but since the family is leaning towards more comfort approach, currently canceled. Continue with IV vancomycin with dialysis, while in hospital and will discontinue upon discharge Appreciate ID consultation   Acute encephalopathy, associated with a stroke as well as infection Multifactorial. Toxic in the setting of morphine use as well as metabolic in the setting of MRSA infection with bacteremia and missing HD Improved   Anemia of chronic kidney disease H&H stable   Chronic systolic and diastolic CHF HTN History of SVT CAD SP CABG, cath 8/23 patent grafts. 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 TSH 11.6 on admission.   Neuropathy Discontinued gabapentin due to  ESRD and will not be receiving HD   Chronic pain syndrome Continue fentanyl patch, hydromorphone   Mood disorder Continue Zoloft  GERD Continue famotidine   Goals of care conversation Current plan is to focus on comfort after discharge Transition to home with hospice with [facility].       Consultants: Nephrology, neurology, ID Procedures performed: HD Disposition: Hospice care Diet recommendation:  Dysphagia type 2 thin Liquid   DISCHARGE MEDICATION: Allergies as of 07/14/2023       Reactions   Bee Venom Anaphylaxis   Atorvastatin Other (See Comments)   myalgia   Diltiazem Itching   Lopressor [metoprolol]    NIGHTMARES. Pt tolerating this now 11/25/22   Oxycodone Other (See Comments)   Reaction not listed on MAR    Reglan [metoclopramide] Other (See Comments)   Suicidal    Rosuvastatin Other (See Comments)   myalgias   Valsartan Itching   Zyrtec [cetirizine] Other (See Comments)   intolerance        Medication List     STOP taking these medications    calcium acetate 667 MG capsule Commonly known as: PHOSLO   Calcium Acetate 667 MG Tabs   cefTRIAXone 2 g injection Commonly known as: ROCEPHIN   EPINEPHrine 0.3 mg/0.3 mL Soaj injection Commonly known as: EPI-PEN   gabapentin 100 MG capsule Commonly known as: NEURONTIN   HumaLOG KwikPen 100 UNIT/ML KwikPen Generic drug: insulin lispro   hydrALAZINE 25 MG tablet Commonly known as: APRESOLINE   isosorbide mononitrate 30 MG 24 hr tablet Commonly known as: IMDUR   methocarbamol 500 MG tablet Commonly known as: ROBAXIN   metoprolol succinate 50 MG 24 hr tablet Commonly known as: TOPROL-XL   pantoprazole 40 MG tablet Commonly known as: PROTONIX   Repatha SureClick 140 MG/ML Soaj Generic drug: Evolocumab   UNABLE TO FIND       TAKE these medications    amiodarone 200 MG tablet Commonly known as: PACERONE Take 1 tablet (200 mg total) by mouth 2 (two) times daily.    aspirin EC 81 MG tablet Take 1 tablet (81 mg total) by mouth daily. Swallow whole.   carboxymethylcellulose 0.5 % Soln Commonly known as: REFRESH PLUS Place into both eyes every 2 (two) hours as needed (for dry eyes).   clopidogrel 75 MG tablet Commonly known as: PLAVIX Take 1 tablet (75 mg total) by mouth daily.   Ensure Original Liqd Take 1 Dose by mouth 3 (three) times daily with meals.   famotidine 20 MG tablet Commonly known as: PEPCID Take 1 tablet (20 mg total) by mouth daily.   fentaNYL 25 MCG/HR Commonly known as: DURAGESIC Place 1 patch onto the skin every 3 (three) days.   Fiasp FlexTouch 100 UNIT/ML FlexTouch Pen Generic drug: insulin aspart Inject 0-15 Units into the skin See admin instructions. Per sliding scale: 0-120=0 notify NP/MD if BS is less than 70; 121-150= 2 units 151-200=3 units 201-250=5 units 251-300=8 units 301-350=11 units 351-400= 15 units and notify NP/MD for BS greater than 400   HYDROmorphone HCl 1 MG/ML Liqd Commonly known as: DILAUDID Take 2 mg by mouth every 6 (six) hours as needed for severe pain (pain score 7-10).   ipratropium-albuterol 0.5-2.5 (3) MG/3ML Soln Commonly known as:  DUONEB Inhale 3 mLs into the lungs in the morning and at bedtime.   levothyroxine 75 MCG tablet Commonly known as: SYNTHROID Take 37.5 mcg by mouth daily.   loratadine 10 MG tablet Commonly known as: CLARITIN Take 10 mg by mouth See admin instructions. Every 2 days   melatonin 3 MG Tabs tablet Take 3 mg by mouth at bedtime.   metoprolol tartrate 25 mg/10 mL Susp Commonly known as: LOPRESSOR Take 2.5 mLs (6.25 mg total) by mouth 2 (two) times daily.   ondansetron 4 MG/2ML Soln injection Commonly known as: ZOFRAN Inject 4 mg into the muscle every 8 (eight) hours as needed for refractory nausea / vomiting.   OXYGEN Inhale 2 L into the lungs continuous.   polyethylene glycol 17 g packet Commonly known as: MiraLax Take 17 g by mouth daily.    sertraline 100 MG tablet Commonly known as: ZOLOFT Take 1 tablet (100 mg total) by mouth daily.        Follow-up Information     Babs Sciara, MD Follow up.   Specialty: Family Medicine Contact information: [address] [place] [address] [phone]                Discharge Exam: Ceasar Mons Weights   07/10/23 0818 07/12/23 0917 07/12/23 1211  Weight: 72.2 kg 72.9 kg 72.2 kg   General: NAD, deconditioned Cardiovascular: S1, S2 present Respiratory: CTAB Abdomen: Soft, nontender, nondistended, bowel sounds present Musculoskeletal: No bilateral pedal edema noted Skin: Normal Psychiatry: Fair mood   Condition at discharge: fair  The results of significant diagnostics from this hospitalization (including imaging, microbiology, ancillary and laboratory) are listed below for reference.   Imaging Studies: DG Abd Portable 1V Result Date: 07/08/2023 CLINICAL DATA:  Nausea EXAM: PORTABLE ABDOMEN - 1 VIEW COMPARISON:  09/13/2018 FINDINGS: Scattered large and small bowel gas is noted. No free air is seen. No constipation or obstructive changes are noted. Mild retained fecal material is noted within the rectal vault. Postsurgical changes in the right hip are seen. IMPRESSION: Mild retained fecal material within the rectal wall. Electronically Signed   By: Alcide Clever M.D.   On: 07/08/2023 18:59   ECHOCARDIOGRAM COMPLETE Result Date: 06/28/2023    ECHOCARDIOGRAM REPORT   Patient Name:   [name] Date of Exam: 06/28/2023 Medical Rec #:  [id number]        Height:       70.0 in Accession #:    [id number]       Weight:       180.0 lb Date of Birth:  Mar 07, 1956        BSA:          1.996 m Patient Age:    67 years         BP:           161/72 mmHg Patient Gender: M                HR:           64 bpm. Exam Location:  Inpatient Procedure: 2D Echo, Color Doppler and Cardiac Doppler Indications:    Stroke  History:        Patient has prior history of Echocardiogram  examinations, most                 recent 02/14/2023. Stroke.  Sonographer:    Harriette Bouillon RDCS Referring Phys: [id number] CHING T TU IMPRESSIONS  1. Left ventricular ejection fraction, by  estimation, is 45 to 50%. The left ventricle has mildly decreased function. The left ventricle demonstrates regional wall motion abnormalities (see scoring diagram/findings for description). The left ventricular  internal cavity size was mildly dilated. There is mild left ventricular hypertrophy. Left ventricular diastolic parameters are consistent with Grade II diastolic dysfunction (pseudonormalization). Elevated left atrial pressure.  2. Right ventricular systolic function is mildly reduced. The right ventricular size is mildly enlarged. Tricuspid regurgitation signal is inadequate for assessing PA pressure.  3. Left atrial size was moderately dilated.  4. Right atrial size was moderately dilated.  5. The mitral valve is degenerative. Trivial mitral valve regurgitation. No evidence of mitral stenosis. Severe mitral annular calcification.  6. The aortic valve is tricuspid. Aortic valve regurgitation is not visualized. No aortic stenosis is present.  7. The inferior vena cava is dilated in size with <50% respiratory variability, suggesting right atrial pressure of 15 mmHg. FINDINGS  Left Ventricle: Left ventricular ejection fraction, by estimation, is 45 to 50%. The left ventricle has mildly decreased function. The left ventricle demonstrates regional wall motion abnormalities. The left ventricular internal cavity size was mildly dilated. There is mild left ventricular hypertrophy. Left ventricular diastolic parameters are consistent with Grade II diastolic dysfunction (pseudonormalization). Elevated left atrial pressure.  LV Wall Scoring: The basal inferolateral segment and basal inferior segment are akinetic. The entire anterior wall, antero-lateral wall, mid and distal lateral wall, entire septum, entire apex, and mid and distal  inferior wall are normal. Right Ventricle: The right ventricular size is mildly enlarged. Right vetricular wall thickness was not well visualized. Right ventricular systolic function is mildly reduced. Tricuspid regurgitation signal is inadequate for assessing PA pressure. Left Atrium: Left atrial size was moderately dilated. Right Atrium: Right atrial size was moderately dilated. Pericardium: There is no evidence of pericardial effusion. Mitral Valve: The mitral valve is degenerative in appearance. Severe mitral annular calcification. Trivial mitral valve regurgitation. No evidence of mitral valve stenosis. Tricuspid Valve: The tricuspid valve is normal in structure. Tricuspid valve regurgitation is trivial. Aortic Valve: The aortic valve is tricuspid. Aortic valve regurgitation is not visualized. No aortic stenosis is present. Pulmonic Valve: The pulmonic valve was grossly normal. Pulmonic valve regurgitation is trivial. Aorta: The aortic root and ascending aorta are structurally normal, with no evidence of dilitation. Venous: The inferior vena cava is dilated in size with less than 50% respiratory variability, suggesting right atrial pressure of 15 mmHg. IAS/Shunts: The interatrial septum was not well visualized.  LEFT VENTRICLE PLAX 2D LVIDd:         5.90 cm   Diastology LVIDs:         5.30 cm   LV e' medial:    5.98 cm/s LV PW:         1.20 cm   LV E/e' medial:  21.6 LV IVS:        1.10 cm   LV e' lateral:   7.62 cm/s LVOT diam:     2.20 cm   LV E/e' lateral: 16.9 LV SV:         87 LV SV Index:   44 LVOT Area:     3.80 cm  RIGHT VENTRICLE         IVC TAPSE (M-mode): 1.1 cm  IVC diam: 3.40 cm LEFT ATRIUM              Index        RIGHT ATRIUM           Index  LA diam:        5.70 cm  2.86 cm/m   RA Area:     26.90 cm LA Vol (A2C):   72.8 ml  36.47 ml/m  RA Volume:   89.30 ml  44.74 ml/m LA Vol (A4C):   101.0 ml 50.60 ml/m LA Biplane Vol: 89.1 ml  44.64 ml/m  AORTIC VALVE LVOT Vmax:   106.00 cm/s LVOT  Vmean:  70.400 cm/s LVOT VTI:    0.230 m  AORTA Ao Root diam: 3.40 cm Ao Asc diam:  3.70 cm MITRAL VALVE MV Area (PHT): 4.40 cm     SHUNTS MV E velocity: 129.00 cm/s  Systemic VTI:  0.23 m MV A velocity: 82.00 cm/s   Systemic Diam: 2.20 cm MV E/A ratio:  1.57 Epifanio Lesches MD Electronically signed by Epifanio Lesches MD Signature Date/Time: 06/28/2023/1:08:43 PM    Final    MR BRAIN WO CONTRAST Result Date: 06/27/2023 CLINICAL DATA:  Stroke, follow up EXAM: MRI HEAD WITHOUT CONTRAST TECHNIQUE: Multiplanar, multiecho pulse sequences of the brain and surrounding structures were obtained without intravenous contrast. COMPARISON:  CT Head 06/27/23 FINDINGS: Brain: There are multiple scattered foci of acute infarct in the bilateral cerebral hemispheres, including the right parietal lobe (series 5, image 89), in the left frontal lobe (series 5, image 93), right frontal lobe (series 5, image 79), and left parietal lobe (series 5, image 79). There are multiple scattered foci of microhemorrhage, predominantly in a central distribution. There is a background of moderate to severe chronic microvascular ischemic change. Vascular: Normal flow voids. Skull and upper cervical spine: Normal marrow signal. Sinuses/Orbits: Trace left mastoid effusion. No middle ear effusion. Paranasal sinuses are clear. Bilateral lens replacement. Orbits are otherwise unremarkable. Other: None. IMPRESSION: Multiple scattered foci of acute infarct in the bilateral cerebral hemispheres, as described above. No hemorrhage or mass effect. Given distribution, these are favored to be secondary to a central embolic etiology. Findings were paged to Dr. Amada Jupiter on 06/27/23 at 6:29 PM. Electronically Signed   By: Lorenza Cambridge M.D.   On: 06/27/2023 18:29   DG Chest Port 1 View Result Date: 06/27/2023 CLINICAL DATA:  Shortness of breath. EXAM: PORTABLE CHEST 1 VIEW COMPARISON:  Chest radiograph dated May 31, 2023. FINDINGS: Mild  cardiomegaly, unchanged. Prior median sternotomy and CABG. Small right pleural effusion with right basilar atelectasis and patchy opacities, similar to slightly increased compared to the prior exam. Streaky left basilar opacities. No pneumothorax. No acute osseous abnormality. Overlapping telemetry wires. IMPRESSION: 1. Small right pleural effusion with right basilar atelectasis, similar to slightly increased compared to the prior exam. Patchy right basilar opacities may reflect atelectasis or infiltrate. 2. Streaky left basilar opacities could reflect atelectasis or infiltrate. Electronically Signed   By: Hart Robinsons M.D.   On: 06/27/2023 16:26   CT ANGIO HEAD NECK W WO CM W PERF (CODE STROKE) Result Date: 06/27/2023 CLINICAL DATA:  Altered mental status, slurred speech, stroke suspected EXAM: CT ANGIOGRAPHY HEAD AND NECK CT PERFUSION BRAIN TECHNIQUE: Multidetector CT imaging of the head and neck was performed using the standard protocol during bolus administration of intravenous contrast. Multiplanar CT image reconstructions and MIPs were obtained to evaluate the vascular anatomy. Carotid stenosis measurements (when applicable) are obtained utilizing NASCET criteria, using the distal internal carotid diameter as the denominator. Multiphase CT imaging of the brain was performed following IV bolus contrast injection. Subsequent parametric perfusion maps were calculated using RAPID software. RADIATION DOSE REDUCTION: This exam was performed according  to the departmental dose-optimization program which includes automated exposure control, adjustment of the mA and/or kV according to patient size and/or use of iterative reconstruction technique. CONTRAST:  OMNIPAQUE IOHEXOL 350 MG/ML SOLN COMPARISON:  No prior CTA available, correlation is made with 06/27/2023 CT stroke code and MRA head 12/14/2020 FINDINGS: CT HEAD FINDINGS For noncontrast findings, please see same day CT head. CTA NECK FINDINGS Aortic  arch: Standard branching. Imaged portion shows no evidence of aneurysm or dissection. No significant stenosis of the major arch vessel origins. Aortic atherosclerosis. Right carotid system: 60% stenosis in the proximal right ICA (series 6, image 209). No evidence of dissection. Left carotid system: No evidence of dissection, occlusion, or hemodynamically significant stenosis (greater than 50%). Vertebral arteries: Severe stenosis at the origin of the right vertebral artery (series 6, image 284), with additional severe stenosis in the right V2 segment (series 6, image 210). The left vertebral artery is patent from its origin to the skull base without significant stenosis. No evidence of dissection. Skeleton: No acute osseous abnormality. Degenerative changes in the cervical spine. Other neck: No acute finding. Upper chest: Moderate right and trace left pleural effusions. Review of the MIP images confirms the above findings CTA HEAD FINDINGS Anterior circulation: Both internal carotid arteries are patent to the termini, with severe stenosis in the proximal right supraclinoid ICA (series 6, image 113) and moderate stenosis in left supraclinoid ICA (series 6, images 107-108). A1 segments patent, with mild stenosis in the distal right A1. Normal anterior communicating artery. Anterior cerebral arteries are patent to their distal aspects without significant stenosis. No M1 stenosis or occlusion. MCA branches perfused to their distal aspects without significant stenosis. Posterior circulation: Severe stenosis in the right V4 just past the dural reflection (series 6, image 166), with an additional segment of severe stenosis in the proximal to mid V4 (series 6, image 150-159), and complete occlusion of the mid to distal right V4 (series 6, images 140-144). The proximal stenosis in mid to distal occlusion appears similar to the prior MRA from 2022. The left vertebral artery is dominant and patent to the vertebrobasilar  junction without significant stenosis. Posterior inferior cerebellar arteries patent proximally. Basilar patent to its distal aspect without significant stenosis. Superior cerebellar arteries patent proximally. Patent P1 segments. PCAs perfused to their distal aspects without significant stenosis. The bilateral posterior communicating arteries are not visualized. Venous sinuses: As permitted by contrast timing, patent. Anatomic variants: None significant. No evidence of aneurysm or vascular malformation. Review of the MIP images confirms the above findings CT Brain Perfusion Findings: ASPECTS: 10 CBF (<30%) Volume: 0mL Perfusion (Tmax>6.0s) volume: 13mL Mismatch Volume: 13mL Infarction Location:No infarct core detected. Area of possible penumbra in the superior right cerebellum, favored to be artifactual. IMPRESSION: 1. 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 2. Severe stenosis in the proximal right supraclinoid ICA and moderate stenosis in the left supraclinoid ICA. 3. 60% stenosis in the proximal right ICA. 4. Severe stenosis at the origin of the right vertebral artery, with additional severe stenosis in the right V2 segment. 5. No infarct core detected. Area of possible penumbra in the superior right cerebellum, favored to be artifactual. 6. Moderate right and trace left pleural effusions. 7. Aortic atherosclerosis. Aortic Atherosclerosis (ICD10-I70.0). Imaging results were communicated on 06/27/2023 at 3:49 pm to provider Dr. Amada Jupiter via secure text paging. Electronically Signed   By: Jill Side  Vasan M.D.   On: 06/27/2023 15:49   CT HEAD CODE STROKE WO CONTRAST Result Date: 06/27/2023 CLINICAL DATA:  Code stroke.  Slurred speech EXAM: CT HEAD WITHOUT CONTRAST TECHNIQUE: Contiguous axial images were obtained from the base of the skull through the vertex without intravenous contrast. RADIATION DOSE REDUCTION:  This exam was performed according to the departmental dose-optimization program which includes automated exposure control, adjustment of the mA and/or kV according to patient size and/or use of iterative reconstruction technique. COMPARISON:  05/31/23 CT head. FINDINGS: Brain: No hemorrhage. No hydrocephalus. No extra-axial fluid collection. No CT evidence of an acute cortical infarct. No mass effect. No mass lesion. There is background of moderate chronic microvascular ischemic change. Generalized volume loss. Vascular: No hyperdense vessel or unexpected calcification. Skull: Normal. Negative for fracture or focal lesion. Sinuses/Orbits: Middle ear or mastoid effusion. Paranasal sinuses are notable for frothy secretions in the right sphenoid sinus. Bilateral lens replacement. Orbits are otherwise unremarkable. Other: No ASPECTS ([place] Stroke Program Early CT Score): 10 IMPRESSION: No hemorrhage or CT evidence of an acute cortical infarct. Aspects is 10. Findings were paged to Dr. Amada Jupiter on 06/27/23 at 3:21 PM. Electronically Signed   By: Lorenza Cambridge M.D.   On: 06/27/2023 15:21    Microbiology: Results for orders placed or performed during the hospital encounter of 06/27/23  Culture, blood (Routine X 2) w Reflex to ID Panel     Status: Abnormal   Collection Time: 06/27/23  4:48 PM   Specimen: BLOOD RIGHT ARM  Result Value Ref Range Status   Specimen Description BLOOD RIGHT ARM  Final   Special Requests   Final    BOTTLES DRAWN AEROBIC AND ANAEROBIC Blood Culture results may not be optimal due to an inadequate volume of blood received in culture bottles   Culture  Setup Time   Final    GRAM POSITIVE COCCI IN CLUSTERS IN BOTH AEROBIC AND ANAEROBIC BOTTLES CRITICAL RESULT CALLED TO, READ BACK BY AND VERIFIED WITH: PHARMD K. AMEND [id number] @2101  FH    Culture (A)  Final    STAPHYLOCOCCUS HOMINIS METHICILLIN RESISTANT STAPHYLOCOCCUS AUREUS THE SIGNIFICANCE OF ISOLATING THIS ORGANISM FROM A  SINGLE SET OF BLOOD CULTURES WHEN MULTIPLE SETS ARE DRAWN IS UNCERTAIN. PLEASE NOTIFY THE MICROBIOLOGY DEPARTMENT WITHIN ONE WEEK IF SPECIATION AND SENSITIVITIES ARE REQUIRED. FOR STAPHYLOCOCCUS HOMINIS CRITICAL RESULT CALLED TO, READ BACK BY AND VERIFIED WITH: PHARMD B AGEE [id number] AT 1010 BY CM Performed at [facility], [address], [place], [place] [address]    Report Status 07/01/2023 FINAL  Final   Organism ID, Bacteria METHICILLIN RESISTANT STAPHYLOCOCCUS AUREUS  Final      Susceptibility   Methicillin resistant staphylococcus aureus - MIC*    CIPROFLOXACIN >=8 RESISTANT Resistant     ERYTHROMYCIN >=8 RESISTANT Resistant     GENTAMICIN <=0.5 SENSITIVE Sensitive     OXACILLIN >=4 RESISTANT Resistant     TETRACYCLINE <=1 SENSITIVE Sensitive     VANCOMYCIN 1 SENSITIVE Sensitive     TRIMETH/SULFA <=10 SENSITIVE Sensitive     CLINDAMYCIN >=8 RESISTANT Resistant     RIFAMPIN <=0.5 SENSITIVE Sensitive     Inducible Clindamycin NEGATIVE Sensitive     LINEZOLID 2 SENSITIVE Sensitive     * METHICILLIN RESISTANT STAPHYLOCOCCUS AUREUS  Blood Culture ID Panel (Reflexed)     Status: Abnormal   Collection Time: 06/27/23  4:48 PM  Result Value Ref Range Status   Enterococcus faecalis NOT DETECTED NOT DETECTED Final  Enterococcus Faecium NOT DETECTED NOT DETECTED Final   Listeria monocytogenes NOT DETECTED NOT DETECTED Final   Staphylococcus species DETECTED (A) NOT DETECTED Final    Comment: CRITICAL RESULT CALLED TO, READ BACK BY AND VERIFIED WITH: PHARMD K. AMEND [phone] @2101  FH    Staphylococcus aureus (BCID) NOT DETECTED NOT DETECTED Final   Staphylococcus epidermidis NOT DETECTED NOT DETECTED Final   Staphylococcus lugdunensis NOT DETECTED NOT DETECTED Final   Streptococcus species NOT DETECTED NOT DETECTED Final   Streptococcus agalactiae NOT DETECTED NOT DETECTED Final   Streptococcus pneumoniae NOT DETECTED NOT DETECTED Final   Streptococcus pyogenes NOT DETECTED NOT  DETECTED Final   A.calcoaceticus-baumannii NOT DETECTED NOT DETECTED Final   Bacteroides fragilis NOT DETECTED NOT DETECTED Final   Enterobacterales NOT DETECTED NOT DETECTED Final   Enterobacter cloacae complex NOT DETECTED NOT DETECTED Final   Escherichia coli NOT DETECTED NOT DETECTED Final   Klebsiella aerogenes NOT DETECTED NOT DETECTED Final   Klebsiella oxytoca NOT DETECTED NOT DETECTED Final   Klebsiella pneumoniae NOT DETECTED NOT DETECTED Final   Proteus species NOT DETECTED NOT DETECTED Final   Salmonella species NOT DETECTED NOT DETECTED Final   Serratia marcescens NOT DETECTED NOT DETECTED Final   Haemophilus influenzae NOT DETECTED NOT DETECTED Final   Neisseria meningitidis NOT DETECTED NOT DETECTED Final   Pseudomonas aeruginosa NOT DETECTED NOT DETECTED Final   Stenotrophomonas maltophilia NOT DETECTED NOT DETECTED Final   Candida albicans NOT DETECTED NOT DETECTED Final   Candida auris NOT DETECTED NOT DETECTED Final   Candida glabrata NOT DETECTED NOT DETECTED Final   Candida krusei NOT DETECTED NOT DETECTED Final   Candida parapsilosis NOT DETECTED NOT DETECTED Final   Candida tropicalis NOT DETECTED NOT DETECTED Final   Cryptococcus neoformans/gattii NOT DETECTED NOT DETECTED Final    Comment: Performed at [facility], [address], [place], [place] [address]  Culture, blood (Routine X 2) w Reflex to ID Panel     Status: None   Collection Time: 06/27/23  6:57 PM   Specimen: BLOOD RIGHT HAND  Result Value Ref Range Status   Specimen Description BLOOD RIGHT HAND  Final   Special Requests   Final    BOTTLES DRAWN AEROBIC ONLY Blood Culture results may not be optimal due to an inadequate volume of blood received in culture bottles   Culture   Final    NO GROWTH 5 DAYS Performed at [facility], [address] [place], [place] [address]    Report Status 07/02/2023 FINAL  Final  Resp panel by RT-PCR (RSV, Flu A&B, Covid) Anterior Nasal Swab      Status: None   Collection Time: 06/27/23  8:00 PM   Specimen: Anterior Nasal Swab  Result Value Ref Range Status   SARS Coronavirus 2 by RT PCR NEGATIVE NEGATIVE Final   Influenza A by PCR NEGATIVE NEGATIVE Final   Influenza B by PCR NEGATIVE NEGATIVE Final    Comment: (NOTE) The Xpert Xpress SARS-CoV-2/FLU/RSV plus assay is intended as an aid in the diagnosis of influenza from Nasopharyngeal swab specimens and should not be used as a sole basis for treatment. Nasal washings and aspirates are unacceptable for Xpert Xpress SARS-CoV-2/FLU/RSV testing.  Fact Sheet for Patients: BloggerCourse.com  Fact Sheet for Healthcare Providers: SeriousBroker.it  This test is not yet approved or cleared by the [place] FDA and has been authorized for detection and/or diagnosis of SARS-CoV-2 by FDA under an Emergency Use Authorization (EUA). This EUA  will remain in effect (meaning this test can be used) for the duration of the COVID-19 declaration under Section 564(b)(1) of the Act, 21 U.S.C. section 360bbb-3(b)(1), unless the authorization is terminated or revoked.     Resp Syncytial Virus by PCR NEGATIVE NEGATIVE Final    Comment: (NOTE) Fact Sheet for Patients: BloggerCourse.com  Fact Sheet for Healthcare Providers: SeriousBroker.it  This test is not yet approved or cleared by the [place] FDA and has been authorized for detection and/or diagnosis of SARS-CoV-2 by FDA under an Emergency Use Authorization (EUA). This EUA will remain in effect (meaning this test can be used) for the duration of the COVID-19 declaration under Section 564(b)(1) of the Act, 21 U.S.C. section 360bbb-3(b)(1), unless the authorization is terminated or revoked.  Performed at [facility], [address], [place], [place] [address]   Urine Culture     Status: None   Collection Time: 06/28/23   2:14 AM   Specimen: Urine, Random  Result Value Ref Range Status   Specimen Description URINE, RANDOM  Final   Special Requests NONE Reflexed from [phone]  Final   Culture   Final    NO GROWTH Performed at [facility], [address] [place], [place] [address]    Report Status 06/29/2023 FINAL  Final  MRSA Next Gen by PCR, Nasal     Status: Abnormal   Collection Time: 06/29/23 10:25 PM   Specimen: Nasal Mucosa; Nasal Swab  Result Value Ref Range Status   MRSA by PCR Next Gen DETECTED (A) NOT DETECTED Final    Comment: RESULT CALLED TO, READ BACK BY AND VERIFIED WITH: Q WINSTON,RN@0044  06/30/23 MK (NOTE) The GeneXpert MRSA Assay (FDA approved for NASAL specimens only), is one component of a comprehensive MRSA colonization surveillance program. It is not intended to diagnose MRSA infection nor to guide or monitor treatment for MRSA infections. Test performance is not FDA approved in patients less than 68 years old. Performed at [facility], [address], [place], [place] [address]   Culture, blood (Routine X 2) w Reflex to ID Panel     Status: None   Collection Time: 07/02/23 10:39 AM   Specimen: BLOOD LEFT HAND  Result Value Ref Range Status   Specimen Description BLOOD LEFT HAND  Final   Special Requests   Final    BOTTLES DRAWN AEROBIC AND ANAEROBIC Blood Culture adequate volume   Culture   Final    NO GROWTH 5 DAYS Performed at [facility], [address] [place], [place] [address]    Report Status 07/07/2023 FINAL  Final  Culture, blood (Routine X 2) w Reflex to ID Panel     Status: None   Collection Time: 07/02/23 10:46 AM   Specimen: BLOOD LEFT HAND  Result Value Ref Range Status   Specimen Description BLOOD LEFT HAND  Final   Special Requests   Final    BOTTLES DRAWN AEROBIC AND ANAEROBIC Blood Culture adequate volume   Culture   Final    NO GROWTH 5 DAYS Performed at [facility], [address], [place], [place] [address]     Report Status 07/07/2023 FINAL  Final    Labs: CBC: Recent Labs  Lab 07/10/23 0646 07/10/23 0902 07/12/23 0924 07/14/23 0642  WBC 7.3 7.6 7.1 5.1  NEUTROABS 5.8  --  6.0  --   HGB 11.3* 10.7* 11.9* 12.4*  HCT 36.2* 35.6* 38.7* 40.0  MCV 81.0  81.5 82.2 80.8  PLT 318 332 344 301   Basic Metabolic Panel: Recent Labs  Lab 07/10/23 0646 07/10/23 0903 07/12/23 0926 07/13/23 0623 07/14/23 0643  NA 133* 136 135 135 134*  K 3.8 3.7 3.9 3.8 4.0  CL 93* 98 95* 96* 93*  CO2 27 26 25 26 28   GLUCOSE 154* 156* 152* 137* 142*  BUN 37* 38* 32* 22 31*  CREATININE 5.70* 5.62* 4.78* 3.67* 4.41*  CALCIUM 8.8* 8.9 8.8* 8.5* 8.6*  PHOS 3.2 3.1 3.2 2.9 3.8   Liver Function Tests: Recent Labs  Lab 07/10/23 0646 07/10/23 0903 07/12/23 0926 07/13/23 0623 07/14/23 0643  AST 14*  --   --   --   --   ALT 8  --   --   --   --   ALKPHOS 76  --   --   --   --   BILITOT 0.9  --   --   --   --   PROT 6.0*  --   --   --   --   ALBUMIN 2.7* 2.6* 2.7* 2.7* 2.7*   CBG: Recent Labs  Lab 07/13/23 0621 07/13/23 1132 07/13/23 1722 07/14/23 0002 07/14/23 0644  GLUCAP 150* 174* 187* 150* 134*    Discharge time spent: greater than 30 minutes.  Signed: Briant Cedar, MD [facility] 07/14/2023

## 2023-07-17 ENCOUNTER — Telehealth: Payer: Self-pay | Admitting: *Deleted

## 2023-07-17 ENCOUNTER — Ambulatory Visit: Payer: Self-pay | Admitting: *Deleted

## 2023-07-17 NOTE — Patient Outreach (Signed)
  Care Coordination   Follow Up Visit Note   07/17/2023  Name: [name] MRN: [id number] DOB: April 20, 1956  [name] is a 68 y.o. year old male who sees [name], Jonna Coup, MD for primary care. I spoke with patient's significant other, [name] by phone today.  What matters to the patients health and wellness today?  End-of-Life Care Counseling Services.    Goals Addressed               This Visit's Progress     COMPLETED: End-of-Life Care Counseling Services. (pt-stated)   On track     Care Coordination Interventions:  Interventions Today    Flowsheet Row Most Recent Value  Chronic Disease   Chronic disease during today's visit Diabetes, Congestive Heart Failure (CHF), Chronic Kidney Disease/End Stage Renal Disease (ESRD), Hypertension (HTN), Other  [End-of-Life Care through Hospice & Palliative Care Services.]  General Interventions   General Interventions Discussed/Reviewed Communication with  [Encouraged Routine Communication with Care Team Members & Providers.]  Doctor Visits Discussed/Reviewed Doctor Visits Discussed, Doctor Visits Reviewed, Annual Wellness Visits, PCP, Specialist  [Encouraged Routine Communication with Care Team Members & Providers.]  PCP/Specialist Visits Compliance with follow-up visit  [Encouraged Routine Communication with Care Team Members & Providers.]  Communication with PCP/Specialists, RN, Pharmacists, Social Work  [Encouraged Routine Communication with Care Team Members & Providers.]  Education Interventions   Provided Verbal Education On When to see the doctor  [Encouraged Routine Communication with Care Team Members & Providers.]  Mental Health Interventions   Mental Health Discussed/Reviewed Depression  [End-of-Life Care Counseling Services Provided.]       Active Listening & Reflection Utilized.  Verbalization of Feelings Encouraged. Emotional Support Provided. Cognitive Behavioral Therapy Performed. CSW Collaboration with  Significant Other, [name] to Confirm Patient's Desire to Discontinue Dialysis Services at [facility] [phone]). CSW Collaboration with Significant Other, [name] to Confirm Patient's Discharge from [facility] (# [phone]) to Return Home for End-of-Life Care Services through [facility] [phone]). CSW Collaboration with Significant Other, [name] to [employer] with Danford Bad, Licensed Clinical Social Worker with [facility] [phone]), if She Has Questions, Needs Assistance, or If Additional Social Work Needs Are Identified in The Near Future.       [name] assessments and interventions completed:  Yes.  Care Coordination Interventions:  Yes, provided.   Follow up plan: No further intervention required.   Encounter Outcome:  Patient Visit Completed.   Danford Bad, BSW, MSW, LCSW [facility], [facility] Clinical Social Worker II Direct Dial: [phone]  Fax: [phone] Website: [email or web address]

## 2023-07-17 NOTE — Transitions of Care (Post Inpatient/ED Visit) (Signed)
   07/17/2023  Name: [name] MRN: [id number] DOB: 27-Apr-1956  Today's TOC FU Call Status: Today's TOC FU Call Status:: Successful TOC FU Call Completed TOC FU Call Complete Date: 07/17/23 Patient's Name and Date of Birth confirmed.  Transition Care Management Follow-up Telephone Call Date of Discharge: 07/14/23 Discharge Facility: [facility]) Type of Discharge: Inpatient Admission Primary Inpatient Discharge Diagnosis:: Stroke How have you been since you were released from the hospital?:  (verified pt is home with hospice, and they have seen pt and everything is in place per family)  Items Reviewed: Verified with family, hospice is active with pt at home.    Medications Reviewed Today: Medications Reviewed Today   Medications were not reviewed in this encounter     Home Care and Equipment/Supplies: Patient discharged home with hospice, per family hospice has seen pt and "everything is set up"      [facility], BSN RN Care Manager/ Transition of Care [place]/ [facility] [phone]

## 2023-07-17 NOTE — Patient Instructions (Signed)
Visit Information  Thank you for taking time to visit with me today. Please don't hesitate to contact me if I can be of assistance to you.   Following are the goals we discussed today:   Goals Addressed               This Visit's Progress     COMPLETED: End-of-Life Care Counseling Services. (pt-stated)   On track     Care Coordination Interventions:  Interventions Today    Flowsheet Row Most Recent Value  Chronic Disease   Chronic disease during today's visit Diabetes, Congestive Heart Failure (CHF), Chronic Kidney Disease/End Stage Renal Disease (ESRD), Hypertension (HTN), Other  [End-of-Life Care through Hospice & Palliative Care Services.]  General Interventions   General Interventions Discussed/Reviewed Communication with  [Encouraged Routine Communication with Care Team Members & Providers.]  Doctor Visits Discussed/Reviewed Doctor Visits Discussed, Doctor Visits Reviewed, Annual Wellness Visits, PCP, Specialist  [Encouraged Routine Communication with Care Team Members & Providers.]  PCP/Specialist Visits Compliance with follow-up visit  [Encouraged Routine Communication with Care Team Members & Providers.]  Communication with PCP/Specialists, RN, Pharmacists, Social Work  [Encouraged Routine Communication with Care Team Members & Providers.]  Education Interventions   Provided Verbal Education On When to see the doctor  [Encouraged Routine Communication with Care Team Members & Providers.]  Mental Health Interventions   Mental Health Discussed/Reviewed Depression  [End-of-Life Care Counseling Services Provided.]       Active Listening & Reflection Utilized.  Verbalization of Feelings Encouraged. Emotional Support Provided. Cognitive Behavioral Therapy Performed. CSW Collaboration with Significant Other, [name] to Confirm Patient's Desire to Discontinue Dialysis Services at [facility] [phone]). CSW Collaboration with  Significant Other, [name] to Confirm Patient's Discharge from [facility] (# [phone]) to Return Home for End-of-Life Care Services through [facility] [phone]). CSW Collaboration with Significant Other, [name] to [employer] with Danford Bad, Licensed Clinical Social Worker with [facility] [phone]), if She Has Questions, Needs Assistance, or If Additional Social Work Needs Are Identified in The Near Future.       Please call the care guide team at [phone] if you need to cancel or reschedule your appointment.   If you are experiencing a Mental Health or Behavioral Health Crisis or need someone to talk to, please call the Suicide and Crisis Lifeline: 988 call the [place] National Suicide Prevention Lifeline: (563)022-0451 or TTY: (310) 658-3946 TTY 404-687-4069) to talk to a trained counselor call 1-800-273-TALK (toll free, 24 hour hotline) go to [facility] [address] [place] [phone]) call the [facility] Crisis Line: ([phone] call 911  Patient verbalizes understanding of instructions and care plan provided today and agrees to view in MyChart. Active MyChart status and patient understanding of how to access instructions and care plan via MyChart confirmed with patient.     No further follow up required.  Danford Bad, BSW, MSW, LCSW [facility], [facility] Clinical Social Worker II Direct Dial: [phone]  Fax: [phone] Website: [email or web address]

## 2023-08-01 ENCOUNTER — Ambulatory Visit: Payer: Self-pay | Admitting: *Deleted

## 2023-08-01 NOTE — Patient Instructions (Signed)
Visit Information  Thank you for taking time to visit with me today. Please don't hesitate to contact me if I can be of assistance to you.   Following are the goals we discussed today:   Goals Addressed               This Visit's Progress     COMPLETED: End-of-Life Care Counseling Services. (pt-stated)   On track     Care Coordination Interventions:  Interventions Today    Flowsheet Row Most Recent Value  Chronic Disease   Chronic disease during today's visit Other  [Patient Expired on 08/09/2023.]  General Interventions   General Interventions Discussed/Reviewed General Interventions Discussed, General Interventions Reviewed, Communication with  [CSW Collaboration with Primary Care Provider & Care Team Members to Report Patient's Demise.]      CSW Collaboration with Significant Other, [name] to  ~ Perform Active Listening & Reflection.  ~ Encourage Verbalization of Feelings. ~ Provide Emotional Support.  ~ Offer Condolences, Sympathy, & Empathy. ~ Initiate Cognitive Behavioral Therapy. ~ Encourage Contact with Danford Bad, Licensed Clinical Social Worker        with [facility] [phone]), if She Has     Questions, Needs Assistance, or If Additional Social Work Needs Are     Identified in The Near Future.       Please call the care guide team at [phone] if you need to cancel or reschedule your appointment.   If you are experiencing a Mental Health or Behavioral Health Crisis or need someone to talk to, please call the Suicide and Crisis Lifeline: 988 call the [place] National Suicide Prevention Lifeline: 2268779002 or TTY: (858)271-0288 TTY 343-705-2798) to talk to a trained counselor call 1-800-273-TALK (toll free, 24 hour hotline) go to [facility] [address], [place] [phone]) call the [facility] Crisis Line: [phone] call 911  Patient verbalizes  understanding of instructions and care plan provided today and agrees to view in MyChart. Active MyChart status and patient understanding of how to access instructions and care plan via MyChart confirmed with patient.     No further follow up required.  Danford Bad, BSW, MSW, LCSW [facility], [facility] Clinical Social Worker II Direct Dial: [phone]  Fax: [phone] Website: [email or web address]

## 2023-08-01 NOTE — Patient Outreach (Signed)
  Care Coordination   Follow Up Visit Note   08/01/2023  Name: [name] MRN: [id number] DOB: [birth year]  [name] is a 68 y.o. year old male who sees [name], Jonna Coup, MD for primary care. I spoke with patient's significant other, [name] by phone today.  What matters to the patients health and wellness today?  End-of-Life Care Counseling Services.   Goals Addressed               This Visit's Progress     COMPLETED: End-of-Life Care Counseling Services. (pt-stated)   On track     Care Coordination Interventions:  Interventions Today    Flowsheet Row Most Recent Value  Chronic Disease   Chronic disease during today's visit Other  [Patient Expired on 07/19/2023.]  General Interventions   General Interventions Discussed/Reviewed General Interventions Discussed, General Interventions Reviewed, Communication with  [CSW Collaboration with Primary Care Provider & Care Team Members to Report Patient's Demise.]      CSW Collaboration with Significant Other, [name] to  ~ Perform Active Listening & Reflection.  ~ Encourage Verbalization of Feelings. ~ Provide Emotional Support.  ~ Offer Condolences, Sympathy, & Empathy. ~ Initiate Cognitive Behavioral Therapy. ~ Encourage Contact with [name], Licensed Clinical Social Worker        with [facility] [phone]), if She Has     Questions, Needs Assistance, or If Additional Social Work Needs Are     Identified in The Near Future.       [name] assessments and interventions completed:  Yes.  Care Coordination Interventions:  Yes, provided.   Follow up plan: No further intervention required.   Encounter Outcome:  Patient Visit Completed.    [name], BSW, MSW, LCSW [facility], [facility] Clinical Social Worker II Direct Dial: [phone]  Fax: [phone] Website: [email or web address]

## 2023-08-12 DEATH — deceased

## 2023-08-24 ENCOUNTER — Telehealth: Payer: Self-pay | Admitting: Family Medicine

## 2023-08-24 NOTE — Telephone Encounter (Signed)
 Placed for on-call to his significant other to express our sympathies-Dr. Lorin Picket
